# Patient Record
Sex: Female | Born: 1975 | Race: Black or African American | Hispanic: No | Marital: Married | Smoking: Never smoker
Health system: Southern US, Community
[De-identification: ages and names within clinical notes are randomized; demographics above are authoritative.]

## PROBLEM LIST (undated history)

## (undated) DIAGNOSIS — I10 Essential (primary) hypertension: Secondary | ICD-10-CM

## (undated) DIAGNOSIS — O903 Peripartum cardiomyopathy: Secondary | ICD-10-CM

## (undated) DIAGNOSIS — R002 Palpitations: Secondary | ICD-10-CM

## (undated) DIAGNOSIS — N83209 Unspecified ovarian cyst, unspecified side: Secondary | ICD-10-CM

## (undated) DIAGNOSIS — G43909 Migraine, unspecified, not intractable, without status migrainosus: Secondary | ICD-10-CM

## (undated) DIAGNOSIS — M25511 Pain in right shoulder: Secondary | ICD-10-CM

## (undated) DIAGNOSIS — K5900 Constipation, unspecified: Secondary | ICD-10-CM

## (undated) DIAGNOSIS — E119 Type 2 diabetes mellitus without complications: Secondary | ICD-10-CM

## (undated) DIAGNOSIS — R0600 Dyspnea, unspecified: Secondary | ICD-10-CM

## (undated) DIAGNOSIS — F418 Other specified anxiety disorders: Secondary | ICD-10-CM

## (undated) DIAGNOSIS — M222X1 Patellofemoral disorders, right knee: Secondary | ICD-10-CM

## (undated) DIAGNOSIS — Z9989 Dependence on other enabling machines and devices: Secondary | ICD-10-CM

## (undated) DIAGNOSIS — S8263XA Displaced fracture of lateral malleolus of unspecified fibula, initial encounter for closed fracture: Secondary | ICD-10-CM

## (undated) DIAGNOSIS — M222X2 Patellofemoral disorders, left knee: Secondary | ICD-10-CM

## (undated) DIAGNOSIS — F419 Anxiety disorder, unspecified: Secondary | ICD-10-CM

## (undated) DIAGNOSIS — D219 Benign neoplasm of connective and other soft tissue, unspecified: Secondary | ICD-10-CM

## (undated) DIAGNOSIS — J45909 Unspecified asthma, uncomplicated: Secondary | ICD-10-CM

## (undated) DIAGNOSIS — K8020 Calculus of gallbladder without cholecystitis without obstruction: Secondary | ICD-10-CM

## (undated) DIAGNOSIS — I509 Heart failure, unspecified: Secondary | ICD-10-CM

## (undated) DIAGNOSIS — G4733 Obstructive sleep apnea (adult) (pediatric): Secondary | ICD-10-CM

## (undated) DIAGNOSIS — F909 Attention-deficit hyperactivity disorder, unspecified type: Secondary | ICD-10-CM

## (undated) DIAGNOSIS — N3090 Cystitis, unspecified without hematuria: Secondary | ICD-10-CM

## (undated) DIAGNOSIS — R1084 Generalized abdominal pain: Secondary | ICD-10-CM

## (undated) DIAGNOSIS — M7918 Myalgia, other site: Secondary | ICD-10-CM

## (undated) DIAGNOSIS — M549 Dorsalgia, unspecified: Secondary | ICD-10-CM

## (undated) DIAGNOSIS — E538 Deficiency of other specified B group vitamins: Secondary | ICD-10-CM

## (undated) DIAGNOSIS — M999 Biomechanical lesion, unspecified: Secondary | ICD-10-CM

## (undated) DIAGNOSIS — I209 Angina pectoris, unspecified: Secondary | ICD-10-CM

## (undated) DIAGNOSIS — M2550 Pain in unspecified joint: Secondary | ICD-10-CM

## (undated) DIAGNOSIS — Z8679 Personal history of other diseases of the circulatory system: Secondary | ICD-10-CM

## (undated) DIAGNOSIS — M1990 Unspecified osteoarthritis, unspecified site: Secondary | ICD-10-CM

## (undated) DIAGNOSIS — K5790 Diverticulosis of intestine, part unspecified, without perforation or abscess without bleeding: Secondary | ICD-10-CM

## (undated) DIAGNOSIS — Z95810 Presence of automatic (implantable) cardiac defibrillator: Secondary | ICD-10-CM

## (undated) DIAGNOSIS — I428 Other cardiomyopathies: Secondary | ICD-10-CM

## (undated) DIAGNOSIS — E669 Obesity, unspecified: Secondary | ICD-10-CM

## (undated) DIAGNOSIS — IMO0002 Reserved for concepts with insufficient information to code with codable children: Secondary | ICD-10-CM

## (undated) DIAGNOSIS — R569 Unspecified convulsions: Secondary | ICD-10-CM

## (undated) DIAGNOSIS — F32A Depression, unspecified: Secondary | ICD-10-CM

## (undated) DIAGNOSIS — G4700 Insomnia, unspecified: Secondary | ICD-10-CM

## (undated) DIAGNOSIS — R600 Localized edema: Secondary | ICD-10-CM

## (undated) DIAGNOSIS — E559 Vitamin D deficiency, unspecified: Secondary | ICD-10-CM

## (undated) DIAGNOSIS — I499 Cardiac arrhythmia, unspecified: Secondary | ICD-10-CM

## (undated) DIAGNOSIS — S43439A Superior glenoid labrum lesion of unspecified shoulder, initial encounter: Secondary | ICD-10-CM

## (undated) DIAGNOSIS — K589 Irritable bowel syndrome without diarrhea: Secondary | ICD-10-CM

## (undated) DIAGNOSIS — F329 Major depressive disorder, single episode, unspecified: Secondary | ICD-10-CM

## (undated) DIAGNOSIS — R0602 Shortness of breath: Secondary | ICD-10-CM

## (undated) DIAGNOSIS — R079 Chest pain, unspecified: Secondary | ICD-10-CM

## (undated) DIAGNOSIS — I5023 Acute on chronic systolic (congestive) heart failure: Secondary | ICD-10-CM

## (undated) HISTORY — DX: Depression, unspecified: F32.A

## (undated) HISTORY — DX: Displaced fracture of lateral malleolus of unspecified fibula, initial encounter for closed fracture: S82.63XA

## (undated) HISTORY — DX: Superior glenoid labrum lesion of unspecified shoulder, initial encounter: S43.439A

## (undated) HISTORY — DX: Attention-deficit hyperactivity disorder, unspecified type: F90.9

## (undated) HISTORY — DX: Heart failure, unspecified: I50.9

## (undated) HISTORY — PX: LAPAROSCOPIC GASTRIC SLEEVE RESECTION: SHX5895

## (undated) HISTORY — DX: Vitamin D deficiency, unspecified: E55.9

## (undated) HISTORY — DX: Localized edema: R60.0

## (undated) HISTORY — DX: Essential (primary) hypertension: I10

## (undated) HISTORY — DX: Anxiety disorder, unspecified: F41.9

## (undated) HISTORY — DX: Pain in unspecified joint: M25.50

## (undated) HISTORY — DX: Pain in right shoulder: M25.511

## (undated) HISTORY — DX: Major depressive disorder, single episode, unspecified: F32.9

## (undated) HISTORY — DX: Personal history of other diseases of the circulatory system: Z86.79

## (undated) HISTORY — DX: Palpitations: R00.2

## (undated) HISTORY — DX: Shortness of breath: R06.02

## (undated) HISTORY — DX: Diverticulosis of intestine, part unspecified, without perforation or abscess without bleeding: K57.90

## (undated) HISTORY — DX: Obesity, unspecified: E66.9

## (undated) HISTORY — DX: Dorsalgia, unspecified: M54.9

## (undated) HISTORY — PX: FRACTURE SURGERY: SHX138

## (undated) HISTORY — DX: Calculus of gallbladder without cholecystitis without obstruction: K80.20

## (undated) HISTORY — DX: Obstructive sleep apnea (adult) (pediatric): G47.33

## (undated) HISTORY — DX: Ventricular premature depolarization: I49.3

## (undated) HISTORY — DX: Patellofemoral disorders, left knee: M22.2X2

## (undated) HISTORY — DX: Insomnia, unspecified: G47.00

## (undated) HISTORY — DX: Biomechanical lesion, unspecified: M99.9

## (undated) HISTORY — DX: Presence of automatic (implantable) cardiac defibrillator: Z95.810

## (undated) HISTORY — DX: Peripartum cardiomyopathy: O90.3

## (undated) HISTORY — DX: Other cardiomyopathies: I42.8

## (undated) HISTORY — DX: Generalized abdominal pain: R10.84

## (undated) HISTORY — DX: Acute on chronic systolic (congestive) heart failure: I50.23

## (undated) HISTORY — DX: Cystitis, unspecified without hematuria: N30.90

## (undated) HISTORY — DX: Chronic combined systolic (congestive) and diastolic (congestive) heart failure: I50.42

## (undated) HISTORY — PX: CARDIAC CATHETERIZATION: SHX172

## (undated) HISTORY — DX: Deficiency of other specified B group vitamins: E53.8

## (undated) HISTORY — DX: Patellofemoral disorders, right knee: M22.2X1

## (undated) HISTORY — DX: Chest pain, unspecified: R07.9

## (undated) HISTORY — DX: Other specified anxiety disorders: F41.8

## (undated) HISTORY — DX: Constipation, unspecified: K59.00

## (undated) HISTORY — PX: FOOT FRACTURE SURGERY: SHX645

## (undated) HISTORY — DX: Myalgia, other site: M79.18

---

## 1997-05-25 HISTORY — PX: TUBAL LIGATION: SHX77

## 1997-05-25 HISTORY — PX: OVARIAN CYST REMOVAL: SHX89

## 1997-07-27 ENCOUNTER — Inpatient Hospital Stay (HOSPITAL_COMMUNITY): Admission: RE | Admit: 1997-07-27 | Discharge: 1997-08-01 | Payer: Self-pay | Admitting: Obstetrics and Gynecology

## 1997-10-02 ENCOUNTER — Other Ambulatory Visit: Admission: RE | Admit: 1997-10-02 | Discharge: 1997-10-02 | Payer: Self-pay | Admitting: Obstetrics and Gynecology

## 1997-11-05 ENCOUNTER — Other Ambulatory Visit: Admission: RE | Admit: 1997-11-05 | Discharge: 1997-11-05 | Payer: Self-pay | Admitting: Obstetrics and Gynecology

## 1997-11-06 ENCOUNTER — Inpatient Hospital Stay (HOSPITAL_COMMUNITY): Admission: AD | Admit: 1997-11-06 | Discharge: 1997-11-06 | Payer: Self-pay | Admitting: Obstetrics and Gynecology

## 1997-11-15 ENCOUNTER — Other Ambulatory Visit: Admission: RE | Admit: 1997-11-15 | Discharge: 1997-11-15 | Payer: Self-pay | Admitting: Obstetrics and Gynecology

## 1997-12-04 ENCOUNTER — Inpatient Hospital Stay (HOSPITAL_COMMUNITY): Admission: AD | Admit: 1997-12-04 | Discharge: 1997-12-04 | Payer: Self-pay | Admitting: Obstetrics and Gynecology

## 1997-12-06 ENCOUNTER — Other Ambulatory Visit: Admission: RE | Admit: 1997-12-06 | Discharge: 1997-12-06 | Payer: Self-pay | Admitting: *Deleted

## 1997-12-14 ENCOUNTER — Inpatient Hospital Stay (HOSPITAL_COMMUNITY): Admission: AD | Admit: 1997-12-14 | Discharge: 1997-12-17 | Payer: Self-pay | Admitting: Obstetrics and Gynecology

## 1997-12-18 ENCOUNTER — Encounter (HOSPITAL_COMMUNITY): Admission: RE | Admit: 1997-12-18 | Discharge: 1998-03-18 | Payer: Self-pay | Admitting: *Deleted

## 1998-01-28 ENCOUNTER — Inpatient Hospital Stay (HOSPITAL_COMMUNITY): Admission: AD | Admit: 1998-01-28 | Discharge: 1998-01-28 | Payer: Self-pay | Admitting: Obstetrics and Gynecology

## 1998-04-01 ENCOUNTER — Ambulatory Visit (HOSPITAL_COMMUNITY): Admission: RE | Admit: 1998-04-01 | Discharge: 1998-04-01 | Payer: Self-pay | Admitting: Interventional Cardiology

## 1998-04-01 ENCOUNTER — Encounter: Payer: Self-pay | Admitting: Interventional Cardiology

## 1998-11-17 ENCOUNTER — Emergency Department (HOSPITAL_COMMUNITY): Admission: EM | Admit: 1998-11-17 | Discharge: 1998-11-17 | Payer: Self-pay | Admitting: Emergency Medicine

## 1998-11-17 ENCOUNTER — Encounter: Payer: Self-pay | Admitting: Emergency Medicine

## 1999-03-22 ENCOUNTER — Ambulatory Visit (HOSPITAL_COMMUNITY): Admission: RE | Admit: 1999-03-22 | Discharge: 1999-03-22 | Payer: Self-pay | Admitting: Obstetrics and Gynecology

## 1999-03-28 ENCOUNTER — Ambulatory Visit (HOSPITAL_COMMUNITY): Admission: RE | Admit: 1999-03-28 | Discharge: 1999-03-28 | Payer: Self-pay | Admitting: Obstetrics and Gynecology

## 1999-05-25 ENCOUNTER — Inpatient Hospital Stay (HOSPITAL_COMMUNITY): Admission: AD | Admit: 1999-05-25 | Discharge: 1999-05-25 | Payer: Self-pay | Admitting: Obstetrics and Gynecology

## 2000-11-13 ENCOUNTER — Emergency Department (HOSPITAL_COMMUNITY): Admission: EM | Admit: 2000-11-13 | Discharge: 2000-11-13 | Payer: Self-pay | Admitting: Emergency Medicine

## 2000-11-13 ENCOUNTER — Encounter: Payer: Self-pay | Admitting: Emergency Medicine

## 2000-12-08 ENCOUNTER — Encounter: Admission: RE | Admit: 2000-12-08 | Discharge: 2000-12-22 | Payer: Self-pay | Admitting: *Deleted

## 2000-12-13 ENCOUNTER — Other Ambulatory Visit: Admission: RE | Admit: 2000-12-13 | Discharge: 2000-12-13 | Payer: Self-pay | Admitting: Obstetrics and Gynecology

## 2001-05-13 ENCOUNTER — Inpatient Hospital Stay (HOSPITAL_COMMUNITY): Admission: AD | Admit: 2001-05-13 | Discharge: 2001-05-13 | Payer: Self-pay | Admitting: Obstetrics and Gynecology

## 2001-12-27 ENCOUNTER — Encounter: Admission: RE | Admit: 2001-12-27 | Discharge: 2002-03-27 | Payer: Self-pay | Admitting: *Deleted

## 2002-05-10 ENCOUNTER — Emergency Department (HOSPITAL_COMMUNITY): Admission: EM | Admit: 2002-05-10 | Discharge: 2002-05-10 | Payer: Self-pay | Admitting: Emergency Medicine

## 2003-07-09 ENCOUNTER — Other Ambulatory Visit: Admission: RE | Admit: 2003-07-09 | Discharge: 2003-07-09 | Payer: Self-pay | Admitting: Obstetrics and Gynecology

## 2003-07-12 ENCOUNTER — Encounter: Admission: RE | Admit: 2003-07-12 | Discharge: 2003-07-12 | Payer: Self-pay | Admitting: Obstetrics and Gynecology

## 2004-02-28 ENCOUNTER — Inpatient Hospital Stay (HOSPITAL_COMMUNITY): Admission: AD | Admit: 2004-02-28 | Discharge: 2004-02-28 | Payer: Self-pay | Admitting: Obstetrics and Gynecology

## 2004-09-24 ENCOUNTER — Inpatient Hospital Stay (HOSPITAL_COMMUNITY): Admission: AD | Admit: 2004-09-24 | Discharge: 2004-09-24 | Payer: Self-pay | Admitting: Obstetrics and Gynecology

## 2004-10-23 ENCOUNTER — Other Ambulatory Visit: Admission: RE | Admit: 2004-10-23 | Discharge: 2004-10-23 | Payer: Self-pay | Admitting: Family Medicine

## 2006-05-25 HISTORY — PX: LAPAROSCOPY ABDOMEN DIAGNOSTIC: PRO50

## 2006-12-24 HISTORY — PX: LAPAROSCOPIC CHOLECYSTECTOMY: SUR755

## 2007-01-04 ENCOUNTER — Inpatient Hospital Stay (HOSPITAL_COMMUNITY): Admission: AD | Admit: 2007-01-04 | Discharge: 2007-01-04 | Payer: Self-pay | Admitting: Obstetrics & Gynecology

## 2007-01-05 ENCOUNTER — Encounter: Payer: Self-pay | Admitting: Emergency Medicine

## 2007-01-06 ENCOUNTER — Observation Stay (HOSPITAL_COMMUNITY): Admission: EM | Admit: 2007-01-06 | Discharge: 2007-01-08 | Payer: Self-pay | Admitting: *Deleted

## 2007-01-07 ENCOUNTER — Ambulatory Visit: Admission: RE | Admit: 2007-01-07 | Discharge: 2007-01-07 | Payer: Self-pay | Admitting: General Surgery

## 2007-01-07 ENCOUNTER — Encounter (HOSPITAL_BASED_OUTPATIENT_CLINIC_OR_DEPARTMENT_OTHER): Payer: Self-pay | Admitting: General Surgery

## 2007-01-11 ENCOUNTER — Inpatient Hospital Stay (HOSPITAL_COMMUNITY): Admission: EM | Admit: 2007-01-11 | Discharge: 2007-01-13 | Payer: Self-pay | Admitting: Emergency Medicine

## 2007-01-26 ENCOUNTER — Ambulatory Visit: Payer: Self-pay | Admitting: Internal Medicine

## 2007-04-15 ENCOUNTER — Emergency Department (HOSPITAL_COMMUNITY): Admission: EM | Admit: 2007-04-15 | Discharge: 2007-04-16 | Payer: Self-pay | Admitting: Emergency Medicine

## 2007-11-18 ENCOUNTER — Encounter: Admission: RE | Admit: 2007-11-18 | Discharge: 2007-11-18 | Payer: Self-pay | Admitting: Interventional Cardiology

## 2010-08-01 ENCOUNTER — Other Ambulatory Visit: Payer: Self-pay | Admitting: Family Medicine

## 2010-08-01 ENCOUNTER — Other Ambulatory Visit (HOSPITAL_COMMUNITY)
Admission: RE | Admit: 2010-08-01 | Discharge: 2010-08-01 | Disposition: A | Discharge status: Home / Self Care | Payer: Commercial Managed Care - PPO | Source: Ambulatory Visit | Attending: Family Medicine | Admitting: Family Medicine

## 2010-08-01 DIAGNOSIS — Z124 Encounter for screening for malignant neoplasm of cervix: Secondary | ICD-10-CM | POA: Insufficient documentation

## 2010-08-01 DIAGNOSIS — Z1159 Encounter for screening for other viral diseases: Secondary | ICD-10-CM | POA: Insufficient documentation

## 2010-10-07 NOTE — Consult Note (Signed)
[name], [name]                ACCOUNT NO.:  [id number]   MEDICAL RECORD NO.:  [id number]          PATIENT TYPE:  INP   LOCATION:  5706                         FACILITY:  [facility]   PHYSICIAN:  Clovis Pu. Cornett, M.D.DATE OF BIRTH:  05-29-1975   DATE OF CONSULTATION:  01/06/2007  DATE OF DISCHARGE:                                 CONSULTATION   CHIEF COMPLAINT:  Abdominal pain.   REFERRING PHYSICIAN:  Anna Genre. Little, M.D.   REASON FOR CONSULTATION:  Abdominal pain.   HISTORY OF PRESENT ILLNESS:  The patient is a 35 year old female with a  3-day history of right upper quadrant pain, nausea, and vomiting.  She  was initially seen at [facility] on the 11th and then her pain  continued.  She was seen yesterday at [facility].  She was  sent home after ultrasound revealed gallstones and thickened gallbladder  wall.  Her pain persisted; and she was told to come to see [place] today  after discussing this with Dr. Catha Gosselin.   Her chief complaint is upper quadrant pain with severe pain 6/10.  Nausea and vomiting is constant.  Pain medicine is not helping her pain  at this point in time.  There is no radiation to her back.  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 Verdis Prime, today, her  cardiologist; who stated she has been doing well over the last 2 years  on medical management.  We were asked to see her at the request of the  emergency room by Dr. Ignacia Palma and Dr. Clarene Duke, her primary care doctor.   PAST MEDICAL HISTORY:  Cardiomyopathy pregnancy.   PAST SURGICAL HISTORY:  1. Dermoid ovarian cyst removed 1999.  2. C-section in 1999.  3. Foot surgery in 2001.   ALLERGIES:  MORPHINE, SULFA, and ASPIRIN.   MEDICATIONS:  1. Toprol XL 550 mg daily.  2. Diovan 80/25 daily.  3. Vicodin 5/500 q.4 h. as needed for pain.   SOCIAL HISTORY:  Denies tobacco or alcohol use.   FAMILY HISTORY:   Noncontributory.   REVIEW OF SYSTEMS:  A 15-point review of systems I have reviewed, in the  chart today; these are negative except for that stated above.   PHYSICAL EXAMINATION:  VITAL SIGNS:  Temperature 97, pulse 80, blood  pressure 130/84.  GENERAL APPEARANCE:  Pleasant female in mild distress.  HEENT:  Extraocular movements are not intact.  No scleral icterus.  NECK:  Supple, nontender, full range of motion, no mass.  PULMONARY:  Lungs clear to auscultation.  Chest wall motion normal.  CARDIOVASCULAR:  Regular rate rhythm without murmur, rub, or gallop.  There is no evidence of extremity edema.  ABDOMEN:  Soft with positive  Murphy's sign, palpable gallbladder under right costal margin.  No  hernia.  EXTREMITIES:  No clubbing, cyanosis, nor edema.  Muscle tone normal.  NEUROLOGIC EXAMINATION:  Glasgow coma scale 15.  Motor sensory function  intact.   DIAGNOSTIC STUDIES:  Ultrasound reveals a thickened gallbladder wall  with gallstones.  No common duct dilatation.  White count is 5800.  LFTs, amylase, and lipase are normal.   IMPRESSION:  Acute cholecystitis.   PLAN:  She will be admitted to [facility].  I spoke with Dr.  Lurene Shadow who will care for her at this point.  She will be set up for  laparoscopic cholecystectomy in the morning and IV antibiotics and IV  fluids.      Thomas A. Cornett, M.D.  Electronically Signed     TAC/MEDQ  D:  01/06/2007  T:  01/07/2007  Job:  [id number]

## 2010-10-07 NOTE — Consult Note (Signed)
[name], [name] NO.:  [id number]   MEDICAL RECORD NO.:  [id number]          PATIENT TYPE:  INP   LOCATION:  5738                         FACILITY:  [facility]   PHYSICIAN:  Jordan Hawks. Elnoria Howard, MD    DATE OF BIRTH:  05-May-1976   DATE OF CONSULTATION:  DATE OF DISCHARGE:                                 CONSULTATION   At that time, the surgery was performed without any event, and she was  subsequently discharged home.  However, the patient states that she  never felt well, and over the weekend, her symptoms continued to  increase.  Currently, she complains of having diffuse abdominal pain, no  fever.  Upon admission, at the evaluation in the emergency room, the  patient was noted to have an elevated white blood cell count of 15.3 and  normal liver enzymes and lipase.  CT scan was performed and did reveal  some fluid around the liver, and a HIDA scan was performed.  The HIDA  scan did state having a bile duct leak.  Subsequently, GI consultation  was requested for further evaluation and treatment.   PAST MEDICAL AND PAST SURGICAL HISTORY:  Is as stated above.   FAMILY HISTORY:  Noncontributory.   SOCIAL HISTORY:  Negative for alcohol, tobacco or illicit drug use.   REVIEW OF SYSTEMS:  As per the history of present illness, otherwise  negative.   PHYSICAL EXAMINATION:  VITAL SIGNS:  Blood pressure is 128/87, heart  rate 94, respirations 20, temperature is 98.1, pulse ox is 95% on room  air.  GENERAL:  The patient is in acute distress, alert and oriented.  HEENT:  Normocephalic, atraumatic.  Extraocular muscles intact.  NECK:  Supple.  No lymphadenopathy.  LUNGS:  Clear to auscultation bilaterally.  CARDIOVASCULAR:  Regular rhythm.  ABDOMEN:  Soft but diffusely tender.  No rebound or rigidity.  EXTREMITIES:  No clubbing, cyanosis or edema.   LABORATORY VALUES:  White blood cell count is 15.3, hemoglobin 12.5,  platelets at 280, sodium 137, potassium 4.0, chloride  101, CO2 31, BUN  2, creatinine 0.7, glucose 132, AST is 42, ALT 34, alk phos 99, total  bilirubin 0.9, lipase 14.  Amylase is 3.8.   IMPRESSION:  Bile duct leak.  At this time, an ERCP is required in order  to help address the bile duct leak.  It does not appear that it is  leaking from the cystic duct, and the report does state that there is  positive leakage around the falciform ligament.  This could be some  leakage from the accessory duct of Luschka.  Further evaluation will be  performed with an ERCP.      Jordan Hawks Elnoria Howard, MD  Electronically Signed     PDH/MEDQ  D:  01/11/2007  T:  01/12/2007  Job:  [id number]   cc:   Sandria Bales. Ezzard Standing, M.D.  Leonie Man, M.D.

## 2010-10-07 NOTE — H&P (Signed)
[name], [name] NO.:  [id number]   MEDICAL RECORD NO.:  [id number]          PATIENT TYPE:  EMS   LOCATION:  MAJO                         FACILITY:  [facility]   PHYSICIAN:  Sandria Bales. Ezzard Standing, M.D.  DATE OF BIRTH:  08/03/1975   DATE OF ADMISSION:  01/11/2007  DATE OF DISCHARGE:                              HISTORY & PHYSICAL   HISTORY OF PRESENT ILLNESS:  This is a 35 year old black female who is a  patient of Dr. Sigmund Hazel and who also sees Dr. Verdis Prime from a  cardiology standpoint.  She presented on January 06, 2007 to [facility] with what was felt to be gallbladder disease; and was admitted  by Dr. Marca Ancona.  She was then sent to the operating room on January 07, 2007 by Dr. Leonie Man; and underwent a laparoscopic  cholecystectomy which was uneventful.  She was discharged home on  Saturday, August 16; however, never felt well.  When she got home she  had increasing abdominal pain on the 17th of August.  She says that she  tried to call our office, but never got a return call.  On Monday, the  18th, she slept most of the day; but then by that evening was having  abdominal pain which some worse nausea and vomiting; and came to the  emergency room around midnight on January 10, 2007.  I was called for  further evaluation.  She had labs, which showed a mildly elevated white  blood count to 15,300 with normal liver function and normal lipase.  A  CT scan showed minimal fluid in her gallbladder fossa, a very scant hint  of free air in that area, but no other significant problems.  She is  pending a HIDA scan at the time of this dictation.   PAST MEDICAL HISTORY/ALLERGIES:  She has allergies to ASPIRIN which he  says makes for difficult breathing, and then CIPRO causes itching,  MORPHINE causes itching, SULFA causes itching and a rash.   CURRENT MEDICATIONS:  1. Toprol-XL 50 mg daily.  2. Diovan 80 mg daily.  3. She was also on some Vicodin for  pain.   PRIOR OPERATIONS:  She had an ovarian cyst/desmoid tumor removed in 1999  by Dr. Kirkland Hun.  She had a cesarean section in 1999; and had  right foot surgery in 2001.   REVIEW OF SYSTEMS:  NEUROLOGIC:  She has had no evidence of seizure, or  loss of consciousness.  PULMONARY:  She has had no history of pneumonia or tuberculosis.  She  does not smoke cigarettes.  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 Verdis Prime.  He sees her once a year; though she says that she has  been doing very well.  She has never had a cardiac catheterization; and  her current medications are taken for both her cardiomyopathy and  hypertension of about nine years duration.  GASTROINTESTINAL:  She has no evidence of peptic ulcer disease, liver  disease, pancreatic disease or colon disease.  Again, her  prior  operations beside the laparoscopic cholecystectomy was her ovarian cyst  removal and C-section.   SOCIAL HISTORY:  She is currently not employed.  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  Her husband is in the room with her.   PHYSICAL EXAMINATION:  On physical exam, her pulse is 108.  Temperature  98.1, respirations 19, blood pressure 144/86.  GENERAL:  She is a well-nourished, somewhat obese, black female, alert  and cooperative on physical exam.  HEENT:  Unremarkable.  NECK:  Supple without masses or thyromegaly.  LUNGS:  Clear to auscultation.  HEART:  Regular rate and rhythm without murmur or rub.  ABDOMEN:  Shows decreased bowel sounds.  She has some mild tenderness  more in the epigastrium.  Her incision look well healed.  She has no  real guarding, though.  EXTREMITIES:  She had good movement in all 4 extremities.  NEUROLOGIC:  Grossly intact.   REVIEW OF HER LABS:  Show a white blood count of 15,300, a hemoglobin of  12.5, hematocrit of 37.3, her  neutrophil count is 81%.  Her urinalysis  was negative.  Sodium was 137, potassium 4.0, chloride of 101, CO2 of  31, glucose of 132, BUN of 2 her SGOT is 42.  Her lipase is 14.   CT scan showed some minimal perihepatic fluid, may be a dot of small  free air; but otherwise is noncontributory.   IMPRESSION:  1. Post cholecystectomy abdominal pain.  We will plan a HIDA scan for      complete evaluation to rule out bile leak.  For the patient's      discomfort, we will plan admission, IV hydration, repeat labs in      the morning, and discuss this with the patient and her husband.  2. Hypertension.  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      Sandria Bales. Ezzard Standing, M.D.  Electronically Signed     DHN/MEDQ  D:  01/11/2007  T:  01/11/2007  Job:  [id number]   cc:   Sigmund Hazel, M.D.  Lyn Records, M.D.

## 2010-10-07 NOTE — Discharge Summary (Signed)
[name], [name]                ACCOUNT NO.:  [id number]   MEDICAL RECORD NO.:  [id number]          PATIENT TYPE:  INP   LOCATION:  5738                         FACILITY:  [facility]   PHYSICIAN:  Currie Paris, M.D.DATE OF BIRTH:  05/02/1976   DATE OF ADMISSION:  01/11/2007  DATE OF DISCHARGE:  01/13/2007                               DISCHARGE SUMMARY   PRIMARY SURGEON:  Leonie Man, M.D.   CONSULTANTS:  Dr. Jordan Hawks. Elnoria Howard, gastroenterology.   CHIEF COMPLAINT AND REASON FOR ADMISSION:  The patient is a 35 year old  female patient, who has a past medical history of hypertension, as well  as postpartum cardiomyopathy, which is followed by Dr. Verdis Prime.  The  patient's primary care physician is Dr. Sigmund Hazel.  She was initially  admitted to [facility] on 01/06/2007, with acute cholecystitis.  S he subsequently underwent laparoscopic cholecystectomy by Dr. Lurene Shadow  on the 15th.  The procedure itself was uneventful.  The patient was  discharged on Saturday, the 16th.  She reports never really feeling well  postoperatively.  She began having increasing abdominal pain on the 17th  and on the 18th, Monday, she slept most of the day, but by that evening  was having significant abdominal pain, with increasing nausea and  vomiting.  She presented to the emergency room just prior to midnight on  the 18th.  Laboratory work showed an elevated white count of 15,300,  with normal liver function tests and normal lipase.  A computed  tomography scan demonstrated minimal fluid in her gallbladder fossa,  small free air suspected related to recent operative intervention, and a  HIDA scan was pending to evaluate for a bile leak.  She was evaluated by  Dr. Ezzard Standing, who noted diminished bowel sounds on abdominal examination,  with mild tenderness in the epigastrium.  The incisions looked well-  healed.  She was admitted with the diagnosis of post cholecystectomy  abdominal pain, rule  out bile leak, with associated problems related to  hypertension and postpartum cardiomyopathy, which have been stable.   HOSPITAL COURSE:  The patient was admitted on the 19th.  As noted,  subsequent HIDA scan did reveal a bile leak.  Dr. Elnoria Howard with  gastroenterology was consulted, and the patient underwent endoscopic  retrograde cholangiopancreatography on 01/11/2007, with an attempt to  place a biliary stent.  This was unsuccessful.  On the 20th, she was re-  taken to the endoscopy laboratory, when another attempt at placing a  stent via endoscopic retrograde cholangiopancreatography was done, and,  again, unsuccessful with the stent placement.  After discussion with Dr.  Elnoria Howard, Dr. Jamey Ripa determined that there was no further interventions  available for gastroenterology services here.  Dr. Leone Payor was the  second physician to attempt endoscopic retrograde  cholangiopancreatography on the 20th.  Again, it was determined that the  patient would benefit from transfer to a tertiary facility for further  evaluation and treatment of a bile leak.  The patient wished to avoid  intraoperative correction, if possible.  Dr. Elnoria Howard did contact a  physician in the gastroenterology  department at [facility], and they have agreed to accept the patient in  transfer regarding this persistent bile leak.  At this time, I do not  have this physician's name for my dictation.   Today's laboratory work shows an increase in the total bilirubin to 2.9  from 1.2 yesterday.  Alkaline phosphatase is 127.  It was 127 yesterday.  AST and ALT are normal.  Yesterday they were mildly elevated at 44 and  43 each.  Amylase and lipase are normal today.  The patient's white  count is also increased slightly to 17,100, from 16,000.  Hemoglobin is  14, and hematocrit is 41.8.  Neutrophils are 83%.  The patient has also  been having difficulty with persistent tachycardia, probably related  to  a combination of volume depletion and beta blocker withdrawal.  Attempts  were made to get the patient on her oral blood pressure medications,  including Toprol XL, but we feel these have not been absorbed  adequately, therefore we switched her to intravenous Lopressor today.  These have been running at 200 an hour.  In addition, the patient has  had intractable hiccoughs today, and she has been given one dose of  Thorazine 25 mg intravenously.  At the time of dictation, the patient is  having a PICC line placed for lack of intravenous access.  In addition,  she has been treated with Zosyn here empirically to cover enteric  pathogens.   FINAL DISCHARGE DIAGNOSES:  1. Postoperative bile leak after laparoscopic cholecystectomy on      01/07/2007.  2. Endoscopic retrograde cholangiopancreatography x2, unsuccessful, in      attempts to stent the biliary tract.  3. Tachycardia, multifactorial, secondary to volume depletion and beta      blocker withdrawal.  4. Hypertension, currently moderately controlled.  5. Postpartum cardiomyopathy, stable, without signs of congestive      heart failure this admission.  6. Intractable hiccoughs, status post one dose of Thorazine.   DISCHARGE MEDICATIONS:  At the time of discharge, the patient was on the  following medications here at [facility]:  1. Dilaudid PCA, full dose.  2. Lopressor 5 mg intravenously q.4h., with hold parameters.  3. Phenergan 25 mg p.r.n. intravenously q.6h.  4. Zosyn 3.375 mg intravenously q.6h.  5. Zofran 4 mg IV q.4h. p.r.n. nausea and vomiting.   DISPOSITION:  I anticipate the patient will be transferred via ambulance  to [facility] to receive care  regarding persistent bile leak.      Allison L. Rennis Harding, N.P.      Currie Paris, M.D.  Electronically Signed    ALE/MEDQ  D:  01/13/2007  T:  01/13/2007  Job:  [id number]   cc:   Leonie Man, M.D.  Jordan Hawks  Elnoria Howard, MD  Lyn Records, M.D.  Sigmund Hazel, M.D.

## 2010-10-07 NOTE — Op Note (Signed)
[name], [name]                ACCOUNT NO.:  [id number]   MEDICAL RECORD NO.:  [id number]          PATIENT TYPE:  INP   LOCATION:  NA                           FACILITY:  [facility]   PHYSICIAN:  Leonie Man, M.D.   DATE OF BIRTH:  May 04, 1976   DATE OF PROCEDURE:  01/07/2007  DATE OF DISCHARGE:                               OPERATIVE REPORT   PREOPERATIVE DIAGNOSIS:  Calculus cholecystitis.   POSTOPERATIVE DIAGNOSIS:  Calculus cholecystitis.   OPERATION PERFORMED:  Laparoscopic cholecystectomy with intraoperative  cholangiogram.   SURGEON:  Leonie Man, M.D.   ASSISTANT:  Adolph Pollack, M.D.   ANESTHESIA:  The anesthesia is general.   SPECIMENS:  The specimens to lab included gallbladder with stones.   ESTIMATED BLOOD LOSS:  The estimated blood loss was minimal.   COMPLICATIONS:  None apparent.   DISPOSITION:  The patient was taken to the PACU in excellent condition.   NOTE AND INDICATIONS:  The patient is a 35 year old married black female  whose habitus is obese.  She has been having symptomatic right upper  quadrant pain associated with nausea,  but without vomiting for the past  10 days.  An ultrasound of the abdomen showed cholelithiasis with some  gallbladder wall thickening, but no pericholecystic fluid or other  stigmata of acute cholecystitis.  The patient has no leukocytosis or  fever.  Her liver function studies are within normal limits.  Gallbladder ultrasound showed multiple layering gallstones.  The patient  comes to the operating room now after the risks and potential benefits  of surgery had been fully discussed.  All questions were answered.  Consent for surgery was obtained.   DESCRIPTION OF THE OPERATION:  With the patient positioned supinely and  following the induction of satisfactory general anesthesia the abdomen  was prepped and draped to be included in a sterile operative field.  Positive identification of the patient as [name]  and the operation  for laparoscopic cholecystectomy was made.   Open laparoscopy was created at the umbilicus through a transverse  supraumbilical incision with the insertion of a Hassan cannula by open  technique.  The peritoneal cavity is insufflated to 14 mmHg pressure  using carbon dioxide.  The camera was inserted and a visual exploration  of the abdomen was carried out.  The patient had some extensive lower  abdominal surgery and in that she has undergone a cesarean section and  an ovarian cystectomy in the past.  Pelvic organs were not visualized.  The large and small intestine were viewed appeared to be abnormal.  The  liver edges were sharp.  The liver surface was smooth.  The gallbladder  was only mildly chronically scarred.   Under direct vision epigastric and flank ports were placed.  The  gallbladder was grasped and retracted cephalad, and dissection was  carried down in the region of the hepatic duodenal ligament to isolate  the cystic artery and cystic duct.  The cystic duct was traced up to its  entry into the gallbladder wall and was then clipped proximally.  It was  opened and  a cystic duct cholangiogram was done by passing a Cook  catheter transcutaneously into the cystic duct and injecting one-half  strength of Hypaque into the extrahepatic biliary system.  The resulting  cholangiogram showed prompt flow of contrast into the duodenum through  normal caliber ducts.  There were no filling defects noted.   The cholangiocatheter was removed; and, the cystic duct was triply  clipped and then transected.  The cystic artery was also triply clipped  and transected.  The gallbladder was dissected free from the liver bed  using electrocautery and maintaining hemostasis throughout the entire  course of the dissection.  At the end of this dissection the gallbladder  was traced and then placed in an Endopouch and removed from the  peritoneal cavity without difficulty.  Right  upper quadrant irrigation  was carried out and it was noted to be clear.  The gallbladder bed was  inspected and noted to be dry.   Sponge and instrument counts were then verified.   The trocars removed under direct vision.  Pneumoperitoneum was allowed  to deflate.  The wound was closed in layers as follows; the umbilical  wound into layers with 0-Vicryl and 4-0 Monocryl, epigastric and flank  wounds were closed with 4-0 Monocryl sutures.  All wounds were  reinforced with Steri-Strips.  Sterile dressings were applied.  The  anesthetic was reversed.   The patient was removed from the operating room to the recovery room in  stable condition.  She tolerated the procedure well.      Leonie Man, M.D.  Electronically Signed     PB/MEDQ  D:  01/07/2007  T:  01/08/2007  Job:  [id number]

## 2011-03-06 LAB — DIFFERENTIAL
Basophils Absolute: 0
Basophils Absolute: 0
Basophils Absolute: 0.1
Basophils Relative: 0
Basophils Relative: 0
Basophils Relative: 0
Eosinophils Absolute: 0
Eosinophils Absolute: 0
Eosinophils Absolute: 0
Eosinophils Relative: 0
Eosinophils Relative: 0
Eosinophils Relative: 0
Lymphocytes Relative: 14
Lymphocytes Relative: 7 — ABNORMAL LOW
Lymphocytes Relative: 8 — ABNORMAL LOW
Lymphs Abs: 1.1
Lymphs Abs: 1.3
Lymphs Abs: 2.1
Monocytes Absolute: 0.7
Monocytes Absolute: 1.5 — ABNORMAL HIGH
Monocytes Absolute: 1.7 — ABNORMAL HIGH
Monocytes Relative: 10
Monocytes Relative: 5
Monocytes Relative: 9
Neutro Abs: 12.4 — ABNORMAL HIGH
Neutro Abs: 13.3 — ABNORMAL HIGH
Neutro Abs: 14.2 — ABNORMAL HIGH
Neutrophils Relative %: 81 — ABNORMAL HIGH
Neutrophils Relative %: 83 — ABNORMAL HIGH
Neutrophils Relative %: 83 — ABNORMAL HIGH

## 2011-03-06 LAB — CBC
HCT: 36.8
HCT: 37.3
HCT: 39.7
HCT: 41.8
Hemoglobin: 12.5
Hemoglobin: 12.6
Hemoglobin: 13.3
Hemoglobin: 14
MCHC: 33.5
MCHC: 33.6
MCHC: 33.6
MCHC: 34.3
MCV: 85.7
MCV: 86.5
MCV: 87.4
MCV: 88.3
Platelets: 247
Platelets: 266
Platelets: 280
Platelets: 317
RBC: 4.29
RBC: 4.31
RBC: 4.54
RBC: 4.74
RDW: 12.8
RDW: 13
RDW: 13.4
RDW: 13.6
WBC: 15.3 — ABNORMAL HIGH
WBC: 16 — ABNORMAL HIGH
WBC: 17.1 — ABNORMAL HIGH
WBC: 5.6

## 2011-03-06 LAB — URINE MICROSCOPIC-ADD ON

## 2011-03-06 LAB — COMPREHENSIVE METABOLIC PANEL
ALT: 15
ALT: 33
ALT: 34
ALT: 43 — ABNORMAL HIGH
AST: 17
AST: 30
AST: 42 — ABNORMAL HIGH
AST: 44 — ABNORMAL HIGH
Albumin: 3.1 — ABNORMAL LOW
Albumin: 3.5
Albumin: 3.6
Albumin: 3.8
Alkaline Phosphatase: 120 — ABNORMAL HIGH
Alkaline Phosphatase: 127 — ABNORMAL HIGH
Alkaline Phosphatase: 96
Alkaline Phosphatase: 99
BUN: 2 — ABNORMAL LOW
BUN: 2 — ABNORMAL LOW
BUN: 3 — ABNORMAL LOW
BUN: 3 — ABNORMAL LOW
CO2: 30
CO2: 31
CO2: 31
CO2: 31
Calcium: 9
Calcium: 9
Calcium: 9.3
Calcium: 9.4
Chloride: 101
Chloride: 103
Chloride: 95 — ABNORMAL LOW
Chloride: 97
Creatinine, Ser: 0.76
Creatinine, Ser: 0.81
Creatinine, Ser: 0.83
Creatinine, Ser: 0.92
GFR calc Af Amer: >60
GFR calc Af Amer: >60
GFR calc Af Amer: >60
GFR calc Af Amer: >60
GFR calc non Af Amer: >60
GFR calc non Af Amer: >60
GFR calc non Af Amer: >60
GFR calc non Af Amer: >60
Glucose, Bld: 101 — ABNORMAL HIGH
Glucose, Bld: 122 — ABNORMAL HIGH
Glucose, Bld: 130 — ABNORMAL HIGH
Glucose, Bld: 132 — ABNORMAL HIGH
Potassium: 4
Potassium: 4.1
Potassium: 4.2
Potassium: 4.8
Sodium: 135
Sodium: 137
Sodium: 137
Sodium: 139
Total Bilirubin: 0.5
Total Bilirubin: 0.6
Total Bilirubin: 1.2
Total Bilirubin: 2.9 — ABNORMAL HIGH
Total Protein: 6.7
Total Protein: 6.9
Total Protein: 7
Total Protein: 7.2

## 2011-03-06 LAB — URINALYSIS, ROUTINE W REFLEX MICROSCOPIC
Bilirubin Urine: NEGATIVE
Glucose, UA: NEGATIVE
Hgb urine dipstick: NEGATIVE
Ketones, ur: NEGATIVE
Leukocytes, UA: NEGATIVE
Nitrite: NEGATIVE
Protein, ur: 30 — AB
Specific Gravity, Urine: 1.029
Urobilinogen, UA: 1
pH: 8

## 2011-03-06 LAB — LIPASE, BLOOD
Lipase: 14
Lipase: 26
Lipase: 31

## 2011-03-06 LAB — AMYLASE: Amylase: 61

## 2011-03-06 LAB — PREGNANCY, URINE: Preg Test, Ur: NEGATIVE

## 2011-03-09 LAB — URINALYSIS, ROUTINE W REFLEX MICROSCOPIC
Bilirubin Urine: NEGATIVE
Glucose, UA: NEGATIVE
Ketones, ur: NEGATIVE
Leukocytes, UA: NEGATIVE
Nitrite: NEGATIVE
Protein, ur: NEGATIVE
Specific Gravity, Urine: 1.025
Urobilinogen, UA: 0.2
pH: 6

## 2011-03-09 LAB — POCT PREGNANCY, URINE
Operator id: 251141
Preg Test, Ur: NEGATIVE

## 2011-03-09 LAB — URINE MICROSCOPIC-ADD ON: WBC, UA: NONE SEEN

## 2012-05-26 ENCOUNTER — Emergency Department (HOSPITAL_COMMUNITY): Payer: Commercial Managed Care - PPO

## 2012-05-26 ENCOUNTER — Emergency Department (HOSPITAL_COMMUNITY)
Admission: EM | Admit: 2012-05-26 | Discharge: 2012-05-26 | Disposition: A | Discharge status: Home / Self Care | Payer: Commercial Managed Care - PPO | Attending: Emergency Medicine | Admitting: Emergency Medicine

## 2012-05-26 ENCOUNTER — Encounter (HOSPITAL_COMMUNITY): Payer: Self-pay | Admitting: Emergency Medicine

## 2012-05-26 DIAGNOSIS — Z79899 Other long term (current) drug therapy: Secondary | ICD-10-CM | POA: Insufficient documentation

## 2012-05-26 DIAGNOSIS — Z8679 Personal history of other diseases of the circulatory system: Secondary | ICD-10-CM | POA: Insufficient documentation

## 2012-05-26 DIAGNOSIS — R51 Headache: Secondary | ICD-10-CM | POA: Insufficient documentation

## 2012-05-26 DIAGNOSIS — R0602 Shortness of breath: Secondary | ICD-10-CM | POA: Insufficient documentation

## 2012-05-26 DIAGNOSIS — R079 Chest pain, unspecified: Secondary | ICD-10-CM | POA: Insufficient documentation

## 2012-05-26 DIAGNOSIS — I10 Essential (primary) hypertension: Secondary | ICD-10-CM | POA: Insufficient documentation

## 2012-05-26 DIAGNOSIS — Z8742 Personal history of other diseases of the female genital tract: Secondary | ICD-10-CM | POA: Insufficient documentation

## 2012-05-26 DIAGNOSIS — R110 Nausea: Secondary | ICD-10-CM | POA: Insufficient documentation

## 2012-05-26 HISTORY — DX: Essential (primary) hypertension: I10

## 2012-05-26 HISTORY — DX: Unspecified ovarian cyst, unspecified side: N83.209

## 2012-05-26 LAB — BASIC METABOLIC PANEL
BUN: 8 mg/dL (ref 6–23)
CO2: 26 mEq/L (ref 19–32)
Calcium: 10.1 mg/dL (ref 8.4–10.5)
Chloride: 95 mEq/L — ABNORMAL LOW (ref 96–112)
Creatinine, Ser: 0.67 mg/dL (ref 0.50–1.10)
GFR calc Af Amer: >90 mL/min (ref >90)
GFR calc non Af Amer: >90 mL/min (ref >90)
Glucose, Bld: 95 mg/dL (ref 70–99)
Potassium: 4.8 mEq/L (ref 3.5–5.1)
Sodium: 135 mEq/L (ref 135–145)

## 2012-05-26 LAB — CBC
HCT: 43.2 % (ref 36.0–46.0)
Hemoglobin: 14.3 g/dL (ref 12.0–15.0)
MCH: 28.5 pg (ref 26.0–34.0)
MCHC: 33.1 g/dL (ref 30.0–36.0)
MCV: 86.2 fL (ref 78.0–100.0)
Platelets: 303 10*3/uL (ref 150–400)
RBC: 5.01 MIL/uL (ref 3.87–5.11)
RDW: 13.7 % (ref 11.5–15.5)
WBC: 7.4 10*3/uL (ref 4.0–10.5)

## 2012-05-26 LAB — POCT I-STAT TROPONIN I: Troponin i, poc: 0 ng/mL (ref 0.00–0.08)

## 2012-05-26 LAB — CK TOTAL AND CKMB (NOT AT ARMC)
CK, MB: 1.9 ng/mL (ref 0.3–4.0)
Relative Index: 1.1 (ref 0.0–2.5)
Total CK: 174 U/L (ref 7–177)

## 2012-05-26 LAB — TROPONIN I: Troponin I: <0.3 ng/mL (ref <0.30)

## 2012-05-26 LAB — PRO B NATRIURETIC PEPTIDE: Pro B Natriuretic peptide (BNP): 55.5 pg/mL (ref 0–125)

## 2012-05-26 MED ORDER — METOCLOPRAMIDE HCL 5 MG/ML IJ SOLN
10.0000 mg | Freq: Once | INTRAMUSCULAR | Status: DC
  Filled 2012-05-26: qty 2

## 2012-05-26 MED ORDER — METOCLOPRAMIDE HCL 5 MG/ML IJ SOLN
10.0000 mg | Freq: Once | INTRAMUSCULAR | Status: AC
  Administered 2012-05-26: 10 mg via INTRAVENOUS

## 2012-05-26 MED ORDER — DIPHENHYDRAMINE HCL 50 MG/ML IJ SOLN
25.0000 mg | Freq: Once | INTRAMUSCULAR | Status: AC
  Administered 2012-05-26: 25 mg via INTRAVENOUS
  Filled 2012-05-26: qty 1

## 2012-05-26 MED ORDER — KETOROLAC TROMETHAMINE 30 MG/ML IJ SOLN
30.0000 mg | Freq: Once | INTRAMUSCULAR | Status: AC
  Administered 2012-05-26: 30 mg via INTRAVENOUS
  Filled 2012-05-26: qty 1

## 2012-05-26 MED ORDER — PROCHLORPERAZINE EDISYLATE 5 MG/ML IJ SOLN: 10.0000 mg | Freq: Once | INTRAMUSCULAR | Status: DC

## 2012-05-26 MED ORDER — SODIUM CHLORIDE 0.9 % IV BOLUS (SEPSIS)
1000.0000 mL | Freq: Once | INTRAVENOUS | Status: AC
  Administered 2012-05-26: 1000 mL via INTRAVENOUS

## 2012-05-26 NOTE — ED Notes (Signed)
Pt reports 4 days of intermittent chest pain and SOB. Described as a pulling sensation. Pt with hx of cardiomyopathy. Pt c/o headache and generalized body aches as well. Denies cough, fever, sore throat symptoms.

## 2012-05-26 NOTE — ED Provider Notes (Signed)
History     CSN: [id number]  Arrival date & time 05/26/12  1437   First MD Initiated Contact with Patient 05/26/12 1747      Chief Complaint  Patient presents with  . Chest Pain    (Consider location/radiation/quality/duration/timing/severity/associated sxs/prior treatment) Patient is a 37 y.o. female presenting with general illness. The history is provided by the patient. No language interpreter was used.  Illness  The current episode started 3 to 5 days ago. The problem occurs continuously. The problem has been unchanged. The problem is moderate. Nothing relieves the symptoms. Nothing aggravates the symptoms. Associated symptoms include nausea and headaches. Pertinent negatives include no fever, no abdominal pain, no constipation, no diarrhea, no vomiting, no congestion, no sore throat, no cough and no rash.    Past Medical History  Diagnosis Date  . Cardiomyopathy   . Hypertension   . Ovarian cyst     Past Surgical History  Procedure Date  . Abdominal surgery   . Cesarean section     History reviewed. No pertinent family history.  History  Substance Use Topics  . Smoking status: Not on file  . Smokeless tobacco: Not on file  . Alcohol Use:     OB History    Grav Para Term Preterm Abortions TAB SAB Ect Mult Living                  Review of Systems  Constitutional: Negative for fever and chills.  HENT: Negative for congestion and sore throat.   Respiratory: Positive for shortness of breath. Negative for cough.   Cardiovascular: Positive for chest pain. Negative for leg swelling.  Gastrointestinal: Positive for nausea. Negative for vomiting, abdominal pain, diarrhea and constipation.  Genitourinary: Negative for dysuria and frequency.  Skin: Negative for color change and rash.  Neurological: Positive for headaches. Negative for dizziness.  Psychiatric/Behavioral: Negative for confusion and agitation.  All other systems reviewed and are  negative.    Allergies  Aspirin; Ivp dye; Vancomycin; and Ciprofloxacin  Home Medications   Current Outpatient Rx  Name  Route  Sig  Dispense  Refill  . COLESEVELAM HCL 625 MG PO TABS   Oral   Take 1,875 mg by mouth daily.         Marland Kitchen DICYCLOMINE HCL 10 MG PO CAPS   Oral   Take 10 mg by mouth 3 (three) times daily.         Marland Kitchen HYDROCHLOROTHIAZIDE 12.5 MG PO CAPS   Oral   Take 12.5 mg by mouth daily.         Marland Kitchen METOPROLOL SUCCINATE ER 50 MG PO TB24   Oral   Take 50 mg by mouth daily. Take with or immediately following a meal.         . ONDANSETRON 4 MG PO TBDP   Oral   Take 4 mg by mouth every 8 (eight) hours as needed.         Marland Kitchen VALSARTAN-HYDROCHLOROTHIAZIDE 160-12.5 MG PO TABS   Oral   Take 1 tablet by mouth daily.           BP 131/73  Pulse 95  Temp 98.1 F (36.7 C) (Oral)  Resp 18  SpO2 95%  LMP 05/17/2012  Physical Exam  Vitals reviewed. Constitutional: She is oriented to person, place, and time. She appears well-developed and well-nourished. No distress.  HENT:  Head: Normocephalic and atraumatic.  Eyes: EOM are normal. Pupils are equal, round, and reactive to light.  Neck:  Normal range of motion. Neck supple.  Cardiovascular: Normal rate and regular rhythm.   Pulses:      Radial pulses are 2+ on the right side, and 2+ on the left side.  Pulmonary/Chest: Effort normal and breath sounds normal. No respiratory distress.  Abdominal: Soft. She exhibits no distension.  Musculoskeletal: Normal range of motion. She exhibits no edema.       Right lower leg: She exhibits no swelling and no edema.       Left lower leg: She exhibits no swelling and no edema.  Neurological: She is alert and oriented to person, place, and time. She has normal strength. No cranial nerve deficit. Coordination normal. GCS eye subscore is 4. GCS verbal subscore is 5. GCS motor subscore is 6.  Skin: Skin is warm and dry.  Psychiatric: She has a normal mood and affect. Her  behavior is normal.    ED Course  Procedures (including critical care time)  Labs Reviewed  BASIC METABOLIC PANEL - Abnormal; Notable for the following:    Chloride 95 (*)     All other components within normal limits  CBC  PRO B NATRIURETIC PEPTIDE  POCT I-STAT TROPONIN I   Results for orders placed during the hospital encounter of 05/26/12  CBC      Component Value Range   WBC 7.4  4.0 - 10.5 K/uL   RBC 5.01  3.87 - 5.11 MIL/uL   Hemoglobin 14.3  12.0 - 15.0 g/dL   HCT 16.1  09.6 - 04.5 %   MCV 86.2  78.0 - 100.0 fL   MCH 28.5  26.0 - 34.0 pg   MCHC 33.1  30.0 - 36.0 g/dL   RDW 40.9  81.1 - 91.4 %   Platelets 303  150 - 400 K/uL  BASIC METABOLIC PANEL      Component Value Range   Sodium 135  135 - 145 mEq/L   Potassium 4.8  3.5 - 5.1 mEq/L   Chloride 95 (*) 96 - 112 mEq/L   CO2 26  19 - 32 mEq/L   Glucose, Bld 95  70 - 99 mg/dL   BUN 8  6 - 23 mg/dL   Creatinine, Ser 7.82  0.50 - 1.10 mg/dL   Calcium 95.6  8.4 - 21.3 mg/dL   GFR calc non Af Amer >90  >90 mL/min   GFR calc Af Amer >90  >90 mL/min  PRO B NATRIURETIC PEPTIDE      Component Value Range   Pro B Natriuretic peptide (BNP) 55.5  0 - 125 pg/mL  POCT I-STAT TROPONIN I      Component Value Range   Troponin i, poc 0.00  0.00 - 0.08 ng/mL   Comment 3           CK TOTAL AND CKMB      Component Value Range   Total CK 174  7 - 177 U/L   CK, MB 1.9  0.3 - 4.0 ng/mL   Relative Index 1.1  0.0 - 2.5  TROPONIN I      Component Value Range   Troponin I <0.30  <0.30 ng/mL   DG Chest 2 View (Final result)   Result time:05/26/12 1930    Final result by Rad Results In Interface (05/26/12 19:30:33)    Narrative:   *RADIOLOGY REPORT*  Clinical Data: Chest pain  CHEST - 2 VIEW  Comparison: None  Findings: The heart size and mediastinal contours are within normal limits. Both lungs are  clear. The visualized skeletal structures are unremarkable.  IMPRESSION: Negative exam.   Original Report Authenticated  By: Signa Kell, M.D.     Date: 05/27/2012  Rate: 97  Rhythm: normal sinus rhythm  QRS Axis: normal  Intervals: normal  ST/T Wave abnormalities: normal  Conduction Disutrbances:none  Narrative Interpretation:   Old EKG Reviewed: none available   No results found.   No diagnosis found.    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 Chest pain has been constant, substernal and left sided, aching/pressure w/ supine position and pulling sensation w/ exertion. Sx improved w/ sitting forward. A/w dyspnea, nausea, neck pain. Denies cough, fever, no hx of DVT/PE, no LE edema or orthopnea. No hx of CAD, non smoker, + family Hx of CAD. TIMI 0. Low risk wells, PERC neg. No pleuritic chest pain. Headache is right sided pressure, gradual onset, constant, not worst of life or thunderclap, no AMS or seizure activity. No fever. + photophobia and nausea.   Exam: afebrile, normotensive, HR 90, no resp distress or hypoxia. Normal neuro exam, no meningeal signs, lungs CTAB, chest wall nttp, no pulse deficit, no LE edema, no JVD.  DDx: headache likely benign - doubt CVA, meningitis, SAH, pseudotumor or VST. Chest pain possibly 2/2 cardiomyopathy. Not likely ACS or PE. Possible pericarditis or myocarditis. Doubt CHF, spont ptx, tamponade.  Plan: will check ECG, CXR, troponin, BNP, CK, cbc, BMP, will give reglan, benadryl and toradol. Will refrain from CT head at this time in light of normal neuro exam.   Course: reassessed, vitals stable, NAD, chest pain and headache resolved. Troponin x 2 neg, CK and CKMB neg, BNP normal CBC and BMP unremarkable, CXR - NACPF. ECG - no acute ischemia. At this time does not appear to CHF, cardiomyopathy, pericarditis, or ACS at this time. Stable for d/c home. Recommend follow up w/ pcp in 1-2 days, and call  cardiology for first available appointment. Given return precautions and follow up instructions. Source of pts chest pain and headache uncertain at this time - possibly 2/2 viral illness as pt admits to myalgia. At this time her headache and chest pain are likely benign in nature.   1. Headache   2. Chest pain    New Prescriptions   No medications on file   Sigmund Hazel, MD [address] [place] [address] [phone]  Schedule an appointment as soon as possible for a visit on 05/27/2012      [facility] EMERGENCY DEPARTMENT [address] [id number] [place] [address] [phone]           Audelia Hives, MD 05/27/12 [phone]

## 2012-05-28 NOTE — ED Provider Notes (Signed)
I have supervised the resident on the management of this patient and agree with the note above. I personally interviewed and examined the patient and my addendum is below.   [name] is a 37 y.o. female hx of post partum cardiomyopathy here with CP, SOB. Symptoms worse with walking and laying down. Also has some intermittent headaches. Vitals stable. DDx includes cardiomyopathy or heart failure vs ACS. She doesn't appear in heart failure on exam. Trop neg x 2, CK nl, BNP nl. I discussed with Dr. Katrinka Blazing, her cardiologist, who agrees that she can see him in the office.    Richardean Canal, MD 05/28/12 [phone]

## 2012-07-09 ENCOUNTER — Other Ambulatory Visit: Payer: Self-pay

## 2012-12-28 ENCOUNTER — Ambulatory Visit (INDEPENDENT_AMBULATORY_CARE_PROVIDER_SITE_OTHER): Payer: 59 | Admitting: Internal Medicine

## 2012-12-28 ENCOUNTER — Ambulatory Visit (INDEPENDENT_AMBULATORY_CARE_PROVIDER_SITE_OTHER)
Admission: RE | Admit: 2012-12-28 | Discharge: 2012-12-28 | Disposition: A | Discharge status: Home / Self Care | Payer: 59 | Source: Ambulatory Visit | Attending: Internal Medicine | Admitting: Internal Medicine

## 2012-12-28 ENCOUNTER — Other Ambulatory Visit (INDEPENDENT_AMBULATORY_CARE_PROVIDER_SITE_OTHER): Payer: 59

## 2012-12-28 ENCOUNTER — Encounter: Payer: Self-pay | Admitting: Internal Medicine

## 2012-12-28 VITALS — BP 128/92 | HR 102 | Temp 97.5°F | Ht 65.0 in | Wt 255.0 lb

## 2012-12-28 DIAGNOSIS — R0600 Dyspnea, unspecified: Secondary | ICD-10-CM

## 2012-12-28 DIAGNOSIS — R0609 Other forms of dyspnea: Secondary | ICD-10-CM

## 2012-12-28 DIAGNOSIS — R0989 Other specified symptoms and signs involving the circulatory and respiratory systems: Secondary | ICD-10-CM

## 2012-12-28 DIAGNOSIS — I10 Essential (primary) hypertension: Secondary | ICD-10-CM

## 2012-12-28 LAB — CBC WITH DIFFERENTIAL/PLATELET
Basophils Absolute: 0 10*3/uL (ref 0.0–0.1)
Basophils Relative: 0.5 % (ref 0.0–3.0)
Eosinophils Absolute: 0.1 10*3/uL (ref 0.0–0.7)
Eosinophils Relative: 1.4 % (ref 0.0–5.0)
HCT: 37.4 % (ref 36.0–46.0)
Hemoglobin: 12.3 g/dL (ref 12.0–15.0)
Lymphocytes Relative: 39.4 % (ref 12.0–46.0)
Lymphs Abs: 2.3 10*3/uL (ref 0.7–4.0)
MCHC: 33 g/dL (ref 30.0–36.0)
MCV: 85.9 fl (ref 78.0–100.0)
Monocytes Absolute: 0.4 10*3/uL (ref 0.1–1.0)
Monocytes Relative: 6.2 % (ref 3.0–12.0)
Neutro Abs: 3.1 10*3/uL (ref 1.4–7.7)
Neutrophils Relative %: 52.5 % (ref 43.0–77.0)
Platelets: 238 10*3/uL (ref 150.0–400.0)
RBC: 4.35 Mil/uL (ref 3.87–5.11)
RDW: 14.4 % (ref 11.5–14.6)
WBC: 5.8 10*3/uL (ref 4.5–10.5)

## 2012-12-28 LAB — BASIC METABOLIC PANEL
BUN: 7 mg/dL (ref 6–23)
CO2: 28 mEq/L (ref 19–32)
Calcium: 9.2 mg/dL (ref 8.4–10.5)
Chloride: 106 mEq/L (ref 96–112)
Creatinine, Ser: 0.8 mg/dL (ref 0.4–1.2)
GFR: 109.1 mL/min (ref >60.00)
Glucose, Bld: 102 mg/dL — ABNORMAL HIGH (ref 70–99)
Potassium: 3.7 mEq/L (ref 3.5–5.1)
Sodium: 139 mEq/L (ref 135–145)

## 2012-12-28 LAB — TSH: TSH: 1.8 u[IU]/mL (ref 0.35–5.50)

## 2012-12-28 LAB — BRAIN NATRIURETIC PEPTIDE: Pro B Natriuretic peptide (BNP): 53 pg/mL (ref 0.0–100.0)

## 2012-12-28 LAB — D-DIMER, QUANTITATIVE: D-Dimer, Quant: 0.74 ug/mL-FEU — ABNORMAL HIGH (ref 0.00–0.48)

## 2012-12-28 NOTE — Patient Instructions (Addendum)
Please remember to go to the lab and x-ray department downstairs for your tests - we will call you with the results when they are available.  You will need to be very careful with calorie intake to place yourself in neg calorie    Please schedule a follow up office visit in 4- 6 weeks, call sooner if needed pfts

## 2012-12-28 NOTE — Progress Notes (Signed)
  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  12/28/2012 1st pulmonary eval/ [name] cc indolent onset progressive doe x 15 years gradually worse to point where has to stop at top one flight of steps or length of parking lot, assoc with noct dry cough esp if too flat   No obvious daytime variabilty or assoc cp or chest tightness, subjective wheeze overt sinus or hb symptoms. No unusual exp hx or h/o childhood pna/ asthma or knowledge of premature birth.   Sleeping ok s cpap without nocturnal  or early am exacerbation  of respiratory  c/o's or need for noct saba or daytime hypersomnolence. Also denies any obvious fluctuation of symptoms with weather or environmental changes or other aggravating or alleviating factors except as outlined above    Review of Systems  Constitutional: Negative for fever, chills and unexpected weight change.  HENT: Positive for congestion. Negative for ear pain, nosebleeds, sore throat, rhinorrhea, sneezing, trouble swallowing, dental problem, voice change, postnasal drip and sinus pressure.   Eyes: Negative for visual disturbance.  Respiratory: Positive for cough and shortness of breath. Negative for choking.   Cardiovascular: Negative for chest pain and leg swelling.  Gastrointestinal: Positive for abdominal pain. Negative for vomiting and diarrhea.  Genitourinary: Negative for difficulty urinating.  Musculoskeletal: Negative for arthralgias.  Skin: Negative for rash.  Neurological: Positive for headaches. Negative for tremors and syncope.  Hematological: Does not bruise/bleed easily.       Objective:   Physical Exam  Baseline wt 165-170 before post partum cm  Wt Readings from Last 3 Encounters:  12/28/12 255 lb  (115.667 kg)     HEENT: nl dentition, turbinates, and orophanx. Nl external ear canals without cough reflex   NECK :  without JVD/Nodes/TM/ nl carotid upstrokes bilaterally   LUNGS: no acc muscle use, clear to A and P bilaterally without cough on insp or exp maneuvers   CV:  RRR  no s3 or murmur or increase in P2, no edema   ABD:  soft and nontender with nl excursion in the supine position. No bruits or organomegaly, bowel sounds nl  MS:  warm without deformities, calf tenderness, cyanosis or clubbing  SKIN: warm and dry without lesions    NEURO:  alert, approp, no deficits     cxr 12/28/12 Mild enlargement of cardiac silhouette.  No acute abnormalities.       Assessment & Plan:

## 2012-12-29 ENCOUNTER — Other Ambulatory Visit: Payer: Self-pay | Admitting: Internal Medicine

## 2012-12-29 DIAGNOSIS — R0600 Dyspnea, unspecified: Secondary | ICD-10-CM

## 2012-12-29 DIAGNOSIS — I10 Essential (primary) hypertension: Secondary | ICD-10-CM | POA: Insufficient documentation

## 2012-12-29 DIAGNOSIS — R0602 Shortness of breath: Secondary | ICD-10-CM

## 2012-12-29 DIAGNOSIS — R7989 Other specified abnormal findings of blood chemistry: Secondary | ICD-10-CM

## 2012-12-29 NOTE — Progress Notes (Signed)
Quick Note:  Spoke with pt and notified of results per Dr. Wert. Pt verbalized understanding and denied any questions.  ______ 

## 2012-12-29 NOTE — Assessment & Plan Note (Addendum)
-   12/28/2012  Walked RA x 3 laps @ 185 ft each stopped due to end of study, no sob but 02 sats fell from 96 to 93  - Labs 12/28/12 ok including bnp x for d dimer up at 0.74 > cta and bilateral venous dopplers rec 12/29/2012   Will need pft's to complete the w/u but suspect this is obesity / deconditioning with suboptimal bp control in setting of known CM > defer this rx to Dr Verdis Prime  In meantime work on wt loss, f/u with pft's

## 2012-12-30 ENCOUNTER — Ambulatory Visit (HOSPITAL_COMMUNITY)
Admission: RE | Admit: 2012-12-30 | Discharge: 2012-12-30 | Disposition: A | Discharge status: Home / Self Care | Payer: 59 | Source: Ambulatory Visit | Attending: Internal Medicine | Admitting: Internal Medicine

## 2012-12-30 ENCOUNTER — Encounter: Payer: Self-pay | Admitting: Internal Medicine

## 2012-12-30 ENCOUNTER — Encounter (INDEPENDENT_AMBULATORY_CARE_PROVIDER_SITE_OTHER): Payer: 59

## 2012-12-30 DIAGNOSIS — R0602 Shortness of breath: Secondary | ICD-10-CM | POA: Insufficient documentation

## 2012-12-30 DIAGNOSIS — R0600 Dyspnea, unspecified: Secondary | ICD-10-CM

## 2012-12-30 DIAGNOSIS — R7989 Other specified abnormal findings of blood chemistry: Secondary | ICD-10-CM

## 2012-12-30 DIAGNOSIS — M7989 Other specified soft tissue disorders: Secondary | ICD-10-CM

## 2012-12-30 DIAGNOSIS — R799 Abnormal finding of blood chemistry, unspecified: Secondary | ICD-10-CM | POA: Insufficient documentation

## 2012-12-30 MED ORDER — TECHNETIUM TC 99M DIETHYLENETRIAME-PENTAACETIC ACID: 40.0000 | Freq: Once | INTRAVENOUS | Status: AC | PRN

## 2012-12-30 MED ORDER — TECHNETIUM TO 99M ALBUMIN AGGREGATED
6.0000 | Freq: Once | INTRAVENOUS | Status: AC | PRN
  Administered 2012-12-30: 6 via INTRAVENOUS

## 2012-12-30 NOTE — Progress Notes (Signed)
Quick Note:  Spoke with pt and notified of results per Dr. Wert. Pt verbalized understanding and denied any questions.  ______ 

## 2013-01-01 ENCOUNTER — Other Ambulatory Visit: Payer: Self-pay

## 2013-01-01 ENCOUNTER — Encounter (HOSPITAL_COMMUNITY): Payer: Self-pay | Admitting: Emergency Medicine

## 2013-01-01 ENCOUNTER — Observation Stay (HOSPITAL_COMMUNITY)
Admission: EM | Admit: 2013-01-01 | Discharge: 2013-01-04 | Disposition: A | Discharge status: Home / Self Care | Payer: 59 | Attending: Internal Medicine | Admitting: Internal Medicine

## 2013-01-01 DIAGNOSIS — R079 Chest pain, unspecified: Secondary | ICD-10-CM | POA: Insufficient documentation

## 2013-01-01 DIAGNOSIS — K589 Irritable bowel syndrome without diarrhea: Secondary | ICD-10-CM | POA: Insufficient documentation

## 2013-01-01 DIAGNOSIS — I10 Essential (primary) hypertension: Secondary | ICD-10-CM | POA: Insufficient documentation

## 2013-01-01 DIAGNOSIS — E669 Obesity, unspecified: Secondary | ICD-10-CM

## 2013-01-01 DIAGNOSIS — R0600 Dyspnea, unspecified: Secondary | ICD-10-CM

## 2013-01-01 DIAGNOSIS — R0609 Other forms of dyspnea: Principal | ICD-10-CM | POA: Insufficient documentation

## 2013-01-01 DIAGNOSIS — O903 Peripartum cardiomyopathy: Secondary | ICD-10-CM | POA: Insufficient documentation

## 2013-01-01 DIAGNOSIS — R0989 Other specified symptoms and signs involving the circulatory and respiratory systems: Principal | ICD-10-CM | POA: Insufficient documentation

## 2013-01-01 DIAGNOSIS — G4733 Obstructive sleep apnea (adult) (pediatric): Secondary | ICD-10-CM | POA: Insufficient documentation

## 2013-01-01 DIAGNOSIS — M7989 Other specified soft tissue disorders: Secondary | ICD-10-CM | POA: Insufficient documentation

## 2013-01-01 HISTORY — DX: Irritable bowel syndrome, unspecified: K58.9

## 2013-01-01 LAB — POCT I-STAT TROPONIN I: Troponin i, poc: 0 ng/mL (ref 0.00–0.08)

## 2013-01-01 LAB — CBC
HCT: 39.8 % (ref 36.0–46.0)
Hemoglobin: 13.5 g/dL (ref 12.0–15.0)
MCH: 29.3 pg (ref 26.0–34.0)
MCHC: 33.9 g/dL (ref 30.0–36.0)
MCV: 86.3 fL (ref 78.0–100.0)
Platelets: 288 10*3/uL (ref 150–400)
RBC: 4.61 MIL/uL (ref 3.87–5.11)
RDW: 13.8 % (ref 11.5–15.5)
WBC: 9.1 10*3/uL (ref 4.0–10.5)

## 2013-01-01 LAB — POCT PREGNANCY, URINE: Preg Test, Ur: NEGATIVE

## 2013-01-01 LAB — BASIC METABOLIC PANEL
BUN: 17 mg/dL (ref 6–23)
CO2: 28 mEq/L (ref 19–32)
Calcium: 9.6 mg/dL (ref 8.4–10.5)
Chloride: 96 mEq/L (ref 96–112)
Creatinine, Ser: 1.03 mg/dL (ref 0.50–1.10)
GFR calc Af Amer: 79 mL/min — ABNORMAL LOW (ref >90)
GFR calc non Af Amer: 68 mL/min — ABNORMAL LOW (ref >90)
Glucose, Bld: 105 mg/dL — ABNORMAL HIGH (ref 70–99)
Potassium: 3.5 mEq/L (ref 3.5–5.1)
Sodium: 135 mEq/L (ref 135–145)

## 2013-01-01 LAB — PRO B NATRIURETIC PEPTIDE: Pro B Natriuretic peptide (BNP): 16.9 pg/mL (ref 0–125)

## 2013-01-01 NOTE — ED Notes (Signed)
C/o pinching pain to L chest, sob, dizziness, nausea, and generalized weakness since 5pm. Pt states she was seen at [facility] for sob and had lower extremity doppler at [facility] 2 days ago to r/o DVT.

## 2013-01-02 ENCOUNTER — Observation Stay (HOSPITAL_COMMUNITY): Payer: 59

## 2013-01-02 DIAGNOSIS — I10 Essential (primary) hypertension: Secondary | ICD-10-CM

## 2013-01-02 DIAGNOSIS — G4733 Obstructive sleep apnea (adult) (pediatric): Secondary | ICD-10-CM | POA: Diagnosis present

## 2013-01-02 DIAGNOSIS — R079 Chest pain, unspecified: Secondary | ICD-10-CM | POA: Diagnosis present

## 2013-01-02 DIAGNOSIS — O903 Peripartum cardiomyopathy: Secondary | ICD-10-CM | POA: Diagnosis present

## 2013-01-02 HISTORY — DX: Obstructive sleep apnea (adult) (pediatric): G47.33

## 2013-01-02 LAB — TROPONIN I: Troponin I: <0.3 ng/mL (ref <0.30)

## 2013-01-02 MED ORDER — FLUTICASONE PROPIONATE 50 MCG/ACT NA SUSP
2.0000 | Freq: Every day | NASAL | Status: DC
  Administered 2013-01-03 – 2013-01-04 (×2): 2 via NASAL
  Filled 2013-01-02 (×2): qty 16

## 2013-01-02 MED ORDER — SODIUM CHLORIDE 0.9 % IV SOLN: 250.0000 mL | INTRAVENOUS | Status: DC | PRN

## 2013-01-02 MED ORDER — POLYETHYLENE GLYCOL 3350 17 G PO PACK
17.0000 g | PACK | Freq: Every day | ORAL | Status: DC | PRN
  Administered 2013-01-02 – 2013-01-03 (×2): 17 g via ORAL
  Filled 2013-01-02 (×2): qty 1

## 2013-01-02 MED ORDER — TRAMADOL HCL 50 MG PO TABS
50.0000 mg | ORAL_TABLET | Freq: Four times a day (QID) | ORAL | Status: DC | PRN
  Administered 2013-01-02 – 2013-01-04 (×3): 50 mg via ORAL
  Filled 2013-01-02 (×3): qty 1

## 2013-01-02 MED ORDER — POLYETHYLENE GLYCOL 3350 17 G PO PACK
17.0000 g | PACK | Freq: Every day | ORAL | Status: DC
  Filled 2013-01-02: qty 1

## 2013-01-02 MED ORDER — FUROSEMIDE 10 MG/ML IJ SOLN
20.0000 mg | Freq: Once | INTRAMUSCULAR | Status: AC
  Administered 2013-01-02: 20 mg via INTRAVENOUS

## 2013-01-02 MED ORDER — AMLODIPINE BESYLATE 5 MG PO TABS
5.0000 mg | ORAL_TABLET | Freq: Every day | ORAL | Status: DC
  Administered 2013-01-02 – 2013-01-04 (×3): 5 mg via ORAL
  Filled 2013-01-02 (×3): qty 1

## 2013-01-02 MED ORDER — DIAZEPAM 5 MG PO TABS
10.0000 mg | ORAL_TABLET | Freq: Once | ORAL | Status: AC
  Administered 2013-01-02: 10 mg via ORAL
  Filled 2013-01-02: qty 2

## 2013-01-02 MED ORDER — HYDROCHLOROTHIAZIDE 50 MG PO TABS
50.0000 mg | ORAL_TABLET | Freq: Every day | ORAL | Status: DC
  Administered 2013-01-02 – 2013-01-04 (×3): 50 mg via ORAL
  Filled 2013-01-02 (×3): qty 1

## 2013-01-02 MED ORDER — ASPIRIN EC 81 MG PO TBEC: 81.0000 mg | DELAYED_RELEASE_TABLET | Freq: Every day | ORAL | Status: DC

## 2013-01-02 MED ORDER — SODIUM CHLORIDE 0.9 % IJ SOLN: 3.0000 mL | INTRAMUSCULAR | Status: DC | PRN

## 2013-01-02 MED ORDER — SODIUM CHLORIDE 0.9 % IJ SOLN
3.0000 mL | Freq: Two times a day (BID) | INTRAMUSCULAR | Status: DC
  Administered 2013-01-02 – 2013-01-04 (×4): 3 mL via INTRAVENOUS

## 2013-01-02 MED ORDER — SODIUM CHLORIDE 0.9 % IJ SOLN: 3.0000 mL | Freq: Two times a day (BID) | INTRAMUSCULAR | Status: DC

## 2013-01-02 MED ORDER — CARVEDILOL 25 MG PO TABS
25.0000 mg | ORAL_TABLET | Freq: Every day | ORAL | Status: DC
  Administered 2013-01-02 – 2013-01-04 (×3): 25 mg via ORAL
  Filled 2013-01-02 (×3): qty 1

## 2013-01-02 MED ORDER — ACETAMINOPHEN 325 MG PO TABS
650.0000 mg | ORAL_TABLET | Freq: Four times a day (QID) | ORAL | Status: DC | PRN
Start: 2013-01-02 — End: 2013-01-04
  Administered 2013-01-02 – 2013-01-04 (×3): 650 mg via ORAL
  Filled 2013-01-02 (×2): qty 2

## 2013-01-02 NOTE — Progress Notes (Signed)
[facility] PROGRESS NOTE  SHAYNA EBLEN ZOX:[id number] DOB: 06-May-1976 DOA: 01/01/2013 PCP: [name], MD  Assessment/Plan: Dyspnea: - Normal BNP - Awaiting 2D echo - s/p lasix last night - CXR unremarkable - VQ very low prob for PE - Would f/u Cardiology recs HTN: - stable and controlled - Cont current regimen OSA: - Cont CPAP as tolerated Postpartum Cariomyopathy: - 2D echo pending Chest Pain: - Thus far, cardiac biomarkers neg x2 - On further questioning, "chest pain" appears to be at the L lower ribs anteriorly. Also complains of nausea with food and early satiety with increased flatus. Reports diarrhea and previous bouts of constipation. - Will therefore obtain KUB to r/o constipation which may present as "chest pain"  Code Status: Full Family Communication: Pt in room (indicate person spoken with, relationship, and if by phone, the number) Disposition Plan: Pending   Consultants:  Cardiology  HPI/Subjective: No major complaints. Feels somewhat better today  Objective: Filed Vitals:   01/01/13 2252 01/02/13 0050 01/02/13 0220 01/02/13 0431  BP: 104/78  118/68 121/76  Pulse: 97 96 83 86  Temp: 98.2 F (36.8 C)  97.9 F (36.6 C) 97.6 F (36.4 C)  TempSrc: Oral  Oral Oral  Resp: 18  18 18   Height:    5\' 5"  (1.651 m)  Weight:    113.49 kg (250 lb 3.2 oz)  SpO2: 98% 100% 99% 99%   No intake or output data in the 24 hours ending 01/02/13 1246 Filed Weights   01/02/13 0431  Weight: 113.49 kg (250 lb 3.2 oz)    Exam:   General:  Awake, in nad  Cardiovascular: regular, s1, s2  Respiratory: normal resp effort, no wheezing  Abdomen: obese, pos bowel sounds, nondistended  Musculoskeletal: perfused, no clubbing   Data Reviewed: Basic Metabolic Panel:  Recent Labs Lab 12/28/12 1128 01/01/13 2310  NA 139 135  K 3.7 3.5  CL 106 96  CO2 28 28  GLUCOSE 102* 105*  BUN 7 17  CREATININE 0.8 1.03  CALCIUM 9.2 9.6   Liver Function  Tests: No results found for this basename: AST, ALT, ALKPHOS, BILITOT, PROT, ALBUMIN,  in the last 168 hours No results found for this basename: LIPASE, AMYLASE,  in the last 168 hours No results found for this basename: AMMONIA,  in the last 168 hours CBC:  Recent Labs Lab 12/28/12 1128 01/01/13 2310  WBC 5.8 9.1  NEUTROABS 3.1  --   HGB 12.3 13.5  HCT 37.4 39.8  MCV 85.9 86.3  PLT 238.0 288   Cardiac Enzymes:  Recent Labs Lab 01/02/13 0720  TROPONINI <0.30   BNP (last 3 results)  Recent Labs  05/26/12 1513 12/28/12 1128 01/01/13 2310  PROBNP 55.5 53.0 16.9   CBG: No results found for this basename: GLUCAP,  in the last 168 hours  No results found for this or any previous visit (from the past 240 hour(s)).   Studies: No results found.  Scheduled Meds: . amLODipine  5 mg Oral Daily  . carvedilol  25 mg Oral Daily  . hydrochlorothiazide  50 mg Oral Daily  . sodium chloride  3 mL Intravenous Q12H  . sodium chloride  3 mL Intravenous Q12H   Continuous Infusions:   Principal Problem:   Dyspnea Active Problems:   HBP (high blood pressure)   Leg swelling   OSA on CPAP   Postpartum cardiomyopathy   Chest pain    Time spent:    CHIU, STEPHEN K  [facility] Pager [phone]. If 7PM-7AM, please contact night-coverage at [email or web address], password [facility] 01/02/2013, 12:46 PM  LOS: 1 day

## 2013-01-02 NOTE — ED Notes (Signed)
The pt  Is c/o of pain under her lt breast   All week.  She has been seen at her doctors office and had every test performed with no diagnosis.  She reports sob none now.  No distress

## 2013-01-02 NOTE — ED Notes (Signed)
The pts 02 sats were 100 % with walking p88

## 2013-01-02 NOTE — ED Provider Notes (Signed)
CSN: [id number]     Arrival date & time 01/01/13  2243 History     First MD Initiated Contact with Patient 01/02/13 0007     Chief Complaint  Patient presents with  . Chest Pain   (Consider location/radiation/quality/duration/timing/severity/associated sxs/prior Treatment) HPI Hx per PT - onset a few days ago worse today.  Began with SOB and CP  - under L breast feels like a pinchng and pulling pain with h/o same with CM.  Evaluated by her Cardiologist 3 days ago for these symptoms and was sent here for DVT study, was referred to Pulmonologist and has scheduled sleep studies and PFTs.  Symptoms worse tonight. No F/C. Tonight feels nauseated. No emesis. C/o weakness and fatigue. SOB worse with ambulation/ exertion.     PT had positive d-dimer 8/6 with neg DVT study and low prob VQ scan 12/29/12  Past Medical History  Diagnosis Date  . Cardiomyopathy   . Hypertension   . Ovarian cyst   . OSA (obstructive sleep apnea)     on CPAP   . IBS (irritable bowel syndrome)    Past Surgical History  Procedure Laterality Date  . Abdominal surgery    . Cesarean section    . Cholecystectomy    . Foot surgery     Family History  Problem Relation Age of Onset  . Emphysema Maternal Grandmother     smoked  . Allergies Daughter   . Heart disease Maternal Grandmother   . Rheum arthritis Mother    History  Substance Use Topics  . Smoking status: Never Smoker   . Smokeless tobacco: Never Used  . Alcohol Use: No   OB History   Grav Para Term Preterm Abortions TAB SAB Ect Mult Living                 Review of Systems  Constitutional: Positive for fatigue. Negative for fever and chills.  HENT: Negative for neck pain and neck stiffness.   Eyes: Negative for pain.  Respiratory: Positive for shortness of breath.   Cardiovascular: Positive for chest pain.  Gastrointestinal: Positive for nausea. Negative for vomiting and abdominal pain.  Genitourinary: Negative for flank pain.   Musculoskeletal: Negative for back pain.  Skin: Negative for rash.  Neurological: Positive for weakness. Negative for headaches.  All other systems reviewed and are negative.    Allergies  Aspirin; Ivp dye; Vancomycin; Ciprofloxacin; and Sulfa antibiotics  Home Medications   Current Outpatient Rx  Name  Route  Sig  Dispense  Refill  . acetaminophen (TYLENOL) 500 MG tablet   Oral   Take 1,000 mg by mouth every 6 (six) hours as needed for pain.         Marland Kitchen amLODipine (NORVASC) 5 MG tablet   Oral   Take 5 mg by mouth daily.         . carvedilol (COREG) 25 MG tablet   Oral   Take 25 mg by mouth daily.         . cholecalciferol (VITAMIN D) 1000 UNITS tablet   Oral   Take 1,000 Units by mouth daily.         . colesevelam (WELCHOL) 625 MG tablet   Oral   Take 625 mg by mouth daily.          . diazepam (VALIUM) 10 MG tablet   Oral   Take 10 mg by mouth daily.          Marland Kitchen dicyclomine (BENTYL) 10 MG  capsule   Oral   Take 10 mg by mouth 3 (three) times daily as needed (for abdominal cramping).          . hydrochlorothiazide (HYDRODIURIL) 50 MG tablet   Oral   Take 50 mg by mouth daily.         Marland Kitchen losartan (COZAAR) 100 MG tablet   Oral   Take 100 mg by mouth 2 (two) times daily.          . ondansetron (ZOFRAN-ODT) 4 MG disintegrating tablet   Oral   Take 4 mg by mouth every 8 (eight) hours as needed.         . promethazine (PHENERGAN) 25 MG tablet   Oral   Take 25 mg by mouth every 6 (six) hours as needed for nausea.          BP 104/78  Pulse 97  Temp(Src) 98.2 F (36.8 C) (Oral)  Resp 18  SpO2 98%  LMP 12/21/2012 Physical Exam  Constitutional: She is oriented to person, place, and time. She appears well-developed and well-nourished.  HENT:  Head: Normocephalic and atraumatic.  Eyes: Conjunctivae and EOM are normal. Pupils are equal, round, and reactive to light.  Neck: Neck supple.  Cardiovascular: Regular rhythm and intact distal  pulses.   Borderline tachycardic  Pulmonary/Chest: Effort normal. No respiratory distress.  Abdominal: Soft. Bowel sounds are normal. She exhibits no distension. There is no tenderness.  obese  Musculoskeletal: Normal range of motion. She exhibits no edema and no tenderness.  Neurological: She is alert and oriented to person, place, and time.  Skin: Skin is warm and dry.    ED Course   Procedures (including critical care time)  Results for orders placed during the hospital encounter of 01/01/13  CBC      Result Value Range   WBC 9.1  4.0 - 10.5 K/uL   RBC 4.61  3.87 - 5.11 MIL/uL   Hemoglobin 13.5  12.0 - 15.0 g/dL   HCT 45.4  09.8 - 11.9 %   MCV 86.3  78.0 - 100.0 fL   MCH 29.3  26.0 - 34.0 pg   MCHC 33.9  30.0 - 36.0 g/dL   RDW 14.7  82.9 - 56.2 %   Platelets 288  150 - 400 K/uL  BASIC METABOLIC PANEL      Result Value Range   Sodium 135  135 - 145 mEq/L   Potassium 3.5  3.5 - 5.1 mEq/L   Chloride 96  96 - 112 mEq/L   CO2 28  19 - 32 mEq/L   Glucose, Bld 105 (*) 70 - 99 mg/dL   BUN 17  6 - 23 mg/dL   Creatinine, Ser 1.30  0.50 - 1.10 mg/dL   Calcium 9.6  8.4 - 86.5 mg/dL   GFR calc non Af Amer 68 (*) >90 mL/min   GFR calc Af Amer 79 (*) >90 mL/min  PRO B NATRIURETIC PEPTIDE      Result Value Range   Pro B Natriuretic peptide (BNP) 16.9  0 - 125 pg/mL  POCT PREGNANCY, URINE      Result Value Range   Preg Test, Ur NEGATIVE  NEGATIVE  POCT I-STAT TROPONIN I      Result Value Range   Troponin i, poc 0.00  0.00 - 0.08 ng/mL   Comment 3            Dg Chest 2 View  12/30/2012   *RADIOLOGY REPORT*  Clinical Data: Shortness  of breath, elevated D-dimer  CHEST - 2 VIEW  Comparison: 12/28/2012  Findings: Cardiomediastinal silhouette is stable.  No acute infiltrate or pleural effusion.  No pulmonary edema.  Bony thorax is unremarkable.  IMPRESSION: No active disease.  No significant change.   Original Report Authenticated By: Natasha Mead, M.D.   Dg Chest 2 View  12/28/2012    *RADIOLOGY REPORT*  Clinical Data:  Shortness of breath, hypertension  CHEST - 2 VIEW  Comparison: 05/26/2012  Findings: Enlargement of cardiac silhouette. Mediastinal contours and pulmonary vascularity normal. Lungs clear. No pleural effusion or pneumothorax. Bones unremarkable. Surgical clips right upper quadrant question cholecystectomy.  IMPRESSION: Mild enlargement of cardiac silhouette. No acute abnormalities.   Original Report Authenticated By: Ulyses Southward, M.D.   Nm Pulmonary Per & Vent  12/30/2012   *RADIOLOGY REPORT*  Clinical Data: Shortness of breath  NM PULMONARY VENTILATION AND PERFUSION SCAN  Views:  Anterior, posterior, left lateral, right lateral, RPO, LPO, RAO, LAO - ventilation and perfusion  Radiopharmaceutical: Technetium 53m DTPA - ventilation; Technetium 60m perfusion - perfusion  Dose:  40.0 mCi - ventilation; 6.0 mCi - perfusion  Route of administration:  Inhalation - ventilation; intravenous - perfusion  Comparison:  Chest radiograph December 30, 2012  Findings: The ventilation study shows homogeneous and symmetric uptake of radiotracer bilaterally.  The perfusion study shows homogeneous and symmetric uptake of radiotracer bilaterally.  There is no appreciable ventilation / perfusion mismatch.  IMPRESSION: No ventilation or perfusion defects.  Very low probability of pulmonary embolus.   Original Report Authenticated By: Bretta Bang, M.D.       Date: 01/02/2013  Rate: 93  Rhythm: normal sinus rhythm  QRS Axis: normal  Intervals: normal  ST/T Wave abnormalities: nonspecific ST changes  Conduction Disutrbances:none  Narrative Interpretation:   Old EKG Reviewed: unchanged  1:09 AM d/w CAR Dr Terressa Koyanagi, recs MED admit, does not feel she needs emergent ECHO 2:03 AM Dr Onalee Hua to admit MDM  CP/ SOB worsening x 3 days, extensive outpatent work up ECG, labs, prior records reviewed CAR consulted MED admit    Sunnie Nielsen, MD 01/02/13 [phone]

## 2013-01-02 NOTE — ED Notes (Signed)
The pt is eating

## 2013-01-02 NOTE — Progress Notes (Signed)
Utilization review completed.  

## 2013-01-02 NOTE — ED Notes (Signed)
Admitting doctor in to see 

## 2013-01-02 NOTE — H&P (Signed)
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  She has seen her cardiologist and pulmonologist as outpt.  She has had a vq scan showing low prob, along with u/s le neg for dvt.  She is scheduled for pfts and repeat sleep study.  She has not been having any fevers. N/v.  No diarrhea.  Some occasional le swelling and hand swelling, only on hctz never been on any stronger diuretics.  No cough.  Cp free now.  Last known EF unknown no echo done in 2 years.    Review of Systems:  Positive and negative as per HPI otherwise all other systems are negative  Past Medical History: Past Medical History  Diagnosis Date  . Cardiomyopathy   . Hypertension   . Ovarian cyst   . OSA (obstructive sleep apnea)     on CPAP   . IBS (irritable bowel syndrome)    Past Surgical History  Procedure Laterality Date  . Abdominal surgery    . Cesarean section    . Cholecystectomy    . Foot surgery      Medications: Prior to Admission medications   Medication Sig Start Date End Date Taking? Authorizing Provider  acetaminophen (TYLENOL) 500 MG tablet Take 1,000 mg by mouth every 6 (six) hours as needed for pain.   Yes Historical Provider, MD  amLODipine (NORVASC) 5 MG tablet Take 5 mg by mouth daily.   Yes Historical Provider, MD  carvedilol (COREG) 25 MG tablet Take 25 mg by mouth daily.   Yes Historical Provider, MD  cholecalciferol (VITAMIN D) 1000 UNITS tablet Take 1,000 Units by mouth daily.   Yes Historical Provider, MD  colesevelam (WELCHOL) 625 MG tablet Take 625 mg by mouth daily.    Yes Historical Provider, MD  diazepam (VALIUM) 10 MG tablet Take 10 mg by mouth daily.    Yes Historical Provider, MD  dicyclomine (BENTYL) 10 MG capsule Take 10 mg by mouth 3 (three) times daily as needed (for abdominal cramping).    Yes  Historical Provider, MD  hydrochlorothiazide (HYDRODIURIL) 50 MG tablet Take 50 mg by mouth daily.   Yes Historical Provider, MD  losartan (COZAAR) 100 MG tablet Take 100 mg by mouth 2 (two) times daily.    Yes Historical Provider, MD  ondansetron (ZOFRAN-ODT) 4 MG disintegrating tablet Take 4 mg by mouth every 8 (eight) hours as needed.   Yes Historical Provider, MD  promethazine (PHENERGAN) 25 MG tablet Take 25 mg by mouth every 6 (six) hours as needed for nausea.   Yes Historical Provider, MD    Allergies:   Allergies  Allergen Reactions  . Aspirin Other (See Comments)    wheezing  . Ivp Dye (Iodinated Diagnostic Agents) Other (See Comments)    Did something to my liver and kidneys told not to use it  . Vancomycin Other (See Comments)    "did something to my kidneys"  . Ciprofloxacin Itching and Rash  . Sulfa Antibiotics Itching and Rash    Social History:  reports that she has never smoked. She has never used smokeless tobacco. She reports that she does not drink alcohol or use illicit drugs.  Family History: Family History  Problem Relation Age of Onset  . Emphysema Maternal Grandmother     smoked  . Allergies  Daughter   . Heart disease Maternal Grandmother   . Rheum arthritis Mother     Physical Exam: Filed Vitals:   01/01/13 2252 01/02/13 0050  BP: 104/78   Pulse: 97 96  Temp: 98.2 F (36.8 C)   TempSrc: Oral   Resp: 18   SpO2: 98% 100%   General appearance: alert, cooperative and no distress Head: Normocephalic, without obvious abnormality, atraumatic Eyes: negative Nose: Nares normal. Septum midline. Mucosa normal. No drainage or sinus tenderness. Neck: no JVD and supple, symmetrical, trachea midline Lungs: clear to auscultation bilaterally Heart: regular rate and rhythm, S1, S2 normal, no murmur, click, rub or gallop Abdomen: soft, non-tender; bowel sounds normal; no masses,  no organomegaly Extremities: extremities normal, atraumatic, no cyanosis or  edema Pulses: 2+ and symmetric Skin: Skin color, texture, turgor normal. No rashes or lesions Neurologic: Grossly normal    Labs on Admission:   Recent Labs  01/01/13 2310  NA 135  K 3.5  CL 96  CO2 28  GLUCOSE 105*  BUN 17  CREATININE 1.03  CALCIUM 9.6    Recent Labs  01/01/13 2310  WBC 9.1  HGB 13.5  HCT 39.8  MCV 86.3  PLT 288    Radiological Exams on Admission: Dg Chest 2 View  12/30/2012   *RADIOLOGY REPORT*  Clinical Data: Shortness of breath, elevated D-dimer  CHEST - 2 VIEW  Comparison: 12/28/2012  Findings: Cardiomediastinal silhouette is stable.  No acute infiltrate or pleural effusion.  No pulmonary edema.  Bony thorax is unremarkable.  IMPRESSION: No active disease.  No significant change.   Original Report Authenticated By: Natasha Mead, M.D.   Dg Chest 2 View  12/28/2012   *RADIOLOGY REPORT*  Clinical Data:  Shortness of breath, hypertension  CHEST - 2 VIEW  Comparison: 05/26/2012  Findings: Enlargement of cardiac silhouette. Mediastinal contours and pulmonary vascularity normal. Lungs clear. No pleural effusion or pneumothorax. Bones unremarkable. Surgical clips right upper quadrant question cholecystectomy.  IMPRESSION: Mild enlargement of cardiac silhouette. No acute abnormalities.   Original Report Authenticated By: Ulyses Southward, M.D.   Nm Pulmonary Per & Vent  12/30/2012   *RADIOLOGY REPORT*  Clinical Data: Shortness of breath  NM PULMONARY VENTILATION AND PERFUSION SCAN  Views:  Anterior, posterior, left lateral, right lateral, RPO, LPO, RAO, LAO - ventilation and perfusion  Radiopharmaceutical: Technetium 26m DTPA - ventilation; Technetium 21m perfusion - perfusion  Dose:  40.0 mCi - ventilation; 6.0 mCi - perfusion  Route of administration:  Inhalation - ventilation; intravenous - perfusion  Comparison:  Chest radiograph December 30, 2012  Findings: The ventilation study shows homogeneous and symmetric uptake of radiotracer bilaterally.  The perfusion study shows  homogeneous and symmetric uptake of radiotracer bilaterally.  There is no appreciable ventilation / perfusion mismatch.  IMPRESSION: No ventilation or perfusion defects.  Very low probability of pulmonary embolus.   Original Report Authenticated By: Bretta Bang, M.D.    Assessment/Plan  37 yo female with h/o PP cardiomyopathy, doe, chest pain Principal Problem:   Dyspnea Active Problems:   HBP (high blood pressure)   Leg swelling   OSA on CPAP   Postpartum cardiomyopathy   Chest pain  obs on tele.  Romi.  Asa.  Ck echo in am.  vss all stable no hypoxia.  ekg nsr.  Cardiology has also been called for consultation.  Give one small dose of lasix.  Full code.  Javien Tesch A 01/02/2013, 2:24 AM

## 2013-01-02 NOTE — ED Notes (Signed)
Report called to the rn on the floor 

## 2013-01-03 DIAGNOSIS — I519 Heart disease, unspecified: Secondary | ICD-10-CM

## 2013-01-03 MED ORDER — MAGNESIUM CITRATE PO SOLN
1.0000 | Freq: Once | ORAL | Status: AC
  Administered 2013-01-03: 1 via ORAL
  Filled 2013-01-03: qty 296

## 2013-01-03 MED ORDER — DIAZEPAM 5 MG PO TABS
10.0000 mg | ORAL_TABLET | Freq: Every evening | ORAL | Status: DC | PRN
  Administered 2013-01-03: 10 mg via ORAL
  Filled 2013-01-03: qty 2

## 2013-01-03 NOTE — Progress Notes (Signed)
Quick Note:  Spoke with pt and notified of results per Dr. Wert. Pt verbalized understanding and denied any questions.  ______ 

## 2013-01-03 NOTE — Progress Notes (Signed)
[facility] PROGRESS NOTE  [name] YNW:[id number] DOB: 1976-01-09 DOA: 01/01/2013 PCP: [name], MD  Assessment/Plan: Dyspnea: - Normal BNP - Awaiting 2D echo - CXR unremarkable - VQ very low prob for PE HTN: - stable and controlled - Cont current regimen OSA: - Cont CPAP as tolerated Postpartum Cariomyopathy: - 2D echo pending - Presenting BNP was normal Chest Pain: - Thus far, serial cardiac biomarkers neg - On further questioning, "chest pain" appears to be at the L lower ribs anteriorly. Also complains of nausea with food and early satiety with increased flatus. KUB difficult to read given body habitus, but per my read, stool noted in ascending colon - difficult to tell elsewhere. - PRN miralax ordered  Code Status: Full Family Communication: Pt in room (indicate person spoken with, relationship, and if by phone, the number) Disposition Plan: Pending   Consultants:  Cardiology  HPI/Subjective: No major complaints. Feels somewhat better today  Objective: Filed Vitals:   01/02/13 0220 01/02/13 0431 01/02/13 2135 01/03/13 0604  BP: 118/68 121/76 114/80 105/72  Pulse: 83 86 82 89  Temp: 97.9 F (36.6 C) 97.6 F (36.4 C) 97.9 F (36.6 C) 98.4 F (36.9 C)  TempSrc: Oral Oral Oral Oral  Resp: 18 18 18 18   Height:  5\' 5"  (1.651 m)    Weight:  113.49 kg (250 lb 3.2 oz)  112.22 kg (247 lb 6.4 oz)  SpO2: 99% 99% 97% 98%   No intake or output data in the 24 hours ending 01/03/13 1048 Filed Weights   01/02/13 0431 01/03/13 0604  Weight: 113.49 kg (250 lb 3.2 oz) 112.22 kg (247 lb 6.4 oz)    Exam:   General:  Awake, in nad  Cardiovascular: regular, s1, s2  Respiratory: normal resp effort, no wheezing  Abdomen: obese, pos bowel sounds, nondistended  Musculoskeletal: perfused, no clubbing   Data Reviewed: Basic Metabolic Panel:  Recent Labs Lab 12/28/12 1128 01/01/13 2310  NA 139 135  K 3.7 3.5  CL 106 96  CO2 28 28  GLUCOSE 102*  105*  BUN 7 17  CREATININE 0.8 1.03  CALCIUM 9.2 9.6   Liver Function Tests: No results found for this basename: AST, ALT, ALKPHOS, BILITOT, PROT, ALBUMIN,  in the last 168 hours No results found for this basename: LIPASE, AMYLASE,  in the last 168 hours No results found for this basename: AMMONIA,  in the last 168 hours CBC:  Recent Labs Lab 12/28/12 1128 01/01/13 2310  WBC 5.8 9.1  NEUTROABS 3.1  --   HGB 12.3 13.5  HCT 37.4 39.8  MCV 85.9 86.3  PLT 238.0 288   Cardiac Enzymes:  Recent Labs Lab 01/02/13 0720  TROPONINI <0.30   BNP (last 3 results)  Recent Labs  05/26/12 1513 12/28/12 1128 01/01/13 2310  PROBNP 55.5 53.0 16.9   CBG: No results found for this basename: GLUCAP,  in the last 168 hours  No results found for this or any previous visit (from the past 240 hour(s)).   Studies: Dg Abd 1 View  01/02/2013   *RADIOLOGY REPORT*  Clinical Data: Nausea since yesterday.  ABDOMEN - 1 VIEW  Comparison: Abdominal CT 01/11/2007.  Findings: Portions of the upper abdomen are excluded.  The visualized bowel gas pattern is normal.  There is no supine evidence of free intraperitoneal air. There is a small radiodensity overlapping the right renal shadow and right eleventh rib which could reflect a small renal calculus.  Cholecystectomy clips are noted.  IMPRESSION:  No acute abdominal findings.  Possible small right renal calculus.   Original Report Authenticated By: [name], M.D.    Scheduled Meds: . amLODipine  5 mg Oral Daily  . carvedilol  25 mg Oral Daily  . fluticasone  2 spray Each Nare Daily  . hydrochlorothiazide  50 mg Oral Daily  . sodium chloride  3 mL Intravenous Q12H   Continuous Infusions:   Principal Problem:   Dyspnea Active Problems:   HBP (high blood pressure)   Leg swelling   OSA on CPAP   Postpartum cardiomyopathy   Chest pain    Time spent:    [name]  [facility] Pager [phone]. If 7PM-7AM, please  contact night-coverage at [email or web address], password [facility] 01/03/2013, 10:48 AM  LOS: 2 days

## 2013-01-03 NOTE — Progress Notes (Signed)
  Echocardiogram 2D Echocardiogram has been performed.  [name] 01/03/2013, 10:02 AM 

## 2013-01-04 DIAGNOSIS — R079 Chest pain, unspecified: Secondary | ICD-10-CM

## 2013-01-04 DIAGNOSIS — R0609 Other forms of dyspnea: Secondary | ICD-10-CM

## 2013-01-04 DIAGNOSIS — O903 Peripartum cardiomyopathy: Secondary | ICD-10-CM

## 2013-01-04 DIAGNOSIS — I10 Essential (primary) hypertension: Secondary | ICD-10-CM

## 2013-01-04 DIAGNOSIS — M7989 Other specified soft tissue disorders: Secondary | ICD-10-CM

## 2013-01-04 DIAGNOSIS — G4733 Obstructive sleep apnea (adult) (pediatric): Secondary | ICD-10-CM

## 2013-01-04 DIAGNOSIS — R0989 Other specified symptoms and signs involving the circulatory and respiratory systems: Secondary | ICD-10-CM

## 2013-01-04 MED ORDER — FUROSEMIDE 40 MG PO TABS: 40.0000 mg | ORAL_TABLET | Freq: Every day | ORAL | Status: DC

## 2013-01-04 NOTE — Care Management Note (Addendum)
  Page 1 of 1   01/04/2013     1:27:52 PM   CARE MANAGEMENT NOTE 01/04/2013  Patient:  [name], [name]   Account Number:  [id number]  Date Initiated:  01/04/2013  Documentation initiated by:  GRAVES-BIGELOW,Chetara Kropp  Subjective/Objective Assessment:   Pt admitted for SOB. Pt has a cpap at home. CM received a referral for: Please have [facility] provide an auto-titration CPAP to the patient with the data to go to Dr Sherene Sires after 3 weeks of use. Range pressure 5-20cmH2O     Action/Plan:   CM did call [facility] to see if pt qualified for new cpap and if settings could be changed. CM will f/u.   Anticipated DC Date:  01/04/2013   Anticipated DC Plan:  HOME/SELF CARE      DC Planning Services  CM consult      Choice offered to / List presented to:             Status of service:  Completed, signed off Medicare Important Message given?   (If response is "NO", the following Medicare IM given date fields will be blank) Date Medicare IM given:   Date Additional Medicare IM given:    Discharge Disposition:  HOME/SELF CARE  Per UR Regulation:  Reviewed for med. necessity/level of care/duration of stay  If discussed at Long Length of Stay Meetings, dates discussed:    Comments:    CM did call [facility] and an order was placed in Epic for Cpap and auto settings to be sent to [facility]. No further needs from CM at this time.

## 2013-01-04 NOTE — Progress Notes (Signed)
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 Patient stated that she had positive culture several years ago while an inpatient at [facility]. Spoke with Infection Prevention department at [facility]. No positive culture for VRE or any other MDRO identified. Notified staff on floor and will discontinue precautions at this time.

## 2013-01-04 NOTE — Discharge Summary (Addendum)
Physician Discharge Summary  [name] ZOX:[id number] DOB: 1975/07/27 DOA: 01/01/2013  PCP: [name], MD  Admit date: 01/01/2013 Discharge date: 01/04/2013  Time spent: 30 minutes  Recommendations for Outpatient Follow-up:  1. Follow up with PCP in 1-2 weeks 2. Repeat basic metabolic profile in 1 week 3. Consider resuming Cozaar if blood pressure allows. This was held secondary to a soft blood pressure during hospitalization 4. Consider outpatient Cardiology follow up 5. 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 She has seen her cardiologist and pulmonologist as outpt. She has had a vq scan showing low prob, along with u/s le neg for dvt. She is scheduled for pfts and repeat sleep study. She has not been having any fevers. N/v. No diarrhea. Some occasional le swelling and hand swelling, only on hctz never been on any stronger diuretics. No cough. Cp free now. Last known EF unknown no echo done in 2 years.   Hospital Course:  The patient was admitted to the floor. A a cxr was found to be unremarkable. On further questioning, the patient describes symptoms suggestive of constipation. She was started on a bowel regimen with good results. However, the patient continued to complain of shortness of breath, worse with exertion. Pulmonary was consulted. The patient was recommended to continue with PFT's ordered, proceed with a pending sleep study, and  cont CPAP with an auto-titration device which is ordered for home with data to go to Dr. Sherene Sires. The patient is to follow up with Pulmonary as an outpatient.  Of note, the patient was noted to have and EF of 35-40% on 2D echo. BNP was normal. The patient will be discharged home with lasix instead of hctz. She should resume Cozaar once her blood pressure allows it.This was held for a soft blood pressure during hospitalization.  Procedures:  2D echo EF 35-40%  Consultations:  Pulmonary  Discharge Exam: Filed Vitals:   01/03/13 1404 01/03/13 2109 01/04/13 0555 01/04/13 1042  BP: 104/70 121/83 136/83 131/92  Pulse: 82 88 87   Temp: 98.2 F (36.8 C) 98.7 F (37.1 C) 97.9 F (36.6 C)   TempSrc:  Oral Oral   Resp: 18 18 18    Height:      Weight:      SpO2: 96% 99% 99%     General: Awake, in nad Cardiovascular: regular, s1, s2 Respiratory: normal resp effort, no wheezing  Discharge Instructions       Future Appointments Provider Department Dept Phone   01/13/2013 12:00 PM Nyoka Cowden, MD [facility] [phone]   01/20/2013 2:45 PM Etta Grandchild, MD [facility] [phone]   02/01/2013 2:30 PM Barbaraann Share, MD [facility] [phone]   02/08/2013 1:00 PM [facility] [phone]   02/08/2013 2:00 PM Nyoka Cowden, MD [facility] [phone]       Medication List  STOP taking these medications       hydrochlorothiazide 50 MG tablet  Commonly known as:  HYDRODIURIL     losartan 100 MG tablet  Commonly known as:  COZAAR      TAKE these medications       acetaminophen 500 MG tablet  Commonly known as:  TYLENOL  Take 1,000 mg by mouth every 6 (six) hours as needed for pain.     amLODipine 5 MG tablet  Commonly known as:  NORVASC  Take 5 mg by mouth daily.     carvedilol 25 MG tablet  Commonly known as:  COREG  Take 25 mg by mouth daily.     cholecalciferol 1000  UNITS tablet  Commonly known as:  VITAMIN D  Take 1,000 Units by mouth daily.     colesevelam 625 MG tablet  Commonly known as:  WELCHOL  Take 625 mg by mouth daily.     diazepam 10 MG tablet  Commonly known as:  VALIUM  Take 10 mg by mouth daily.     dicyclomine 10 MG capsule  Commonly known as:  BENTYL  Take 10 mg by mouth 3 (three) times daily as needed (for abdominal cramping).     furosemide 40 MG tablet  Commonly known as:  LASIX  Take 1 tablet (40 mg total) by mouth daily.  Start taking on:  01/05/2013     ondansetron 4 MG disintegrating tablet  Commonly known as:  ZOFRAN-ODT  Take 4 mg by mouth every 8 (eight) hours as needed.     promethazine 25 MG tablet  Commonly known as:  PHENERGAN  Take 25 mg by mouth every 6 (six) hours as needed for nausea.       Allergies  Allergen Reactions  . Aspirin Other (See Comments)    wheezing  . Ivp Dye [Iodinated Diagnostic Agents] Other (See Comments)    Did something to my liver and kidneys told not to use it  . Vancomycin Other (See Comments)    "did something to my kidneys"  . Ciprofloxacin Itching and Rash  . Sulfa Antibiotics Itching and Rash   Follow-up Information   Follow up with LITTLE,JAMES, MD. Schedule an appointment as soon as possible for a visit in 1 week.   Specialty:  Family Medicine   Contact information:   [address] [place] [address] [phone]       Follow up with Follow up with your lung doctor as scheduled.       The results of significant diagnostics from this hospitalization (including imaging, microbiology, ancillary and laboratory) are listed below for reference.    Significant Diagnostic Studies: Dg Chest 2 View  12/30/2012   *RADIOLOGY REPORT*  Clinical Data: Shortness of breath, elevated D-dimer  CHEST - 2 VIEW  Comparison: 12/28/2012  Findings: Cardiomediastinal silhouette is stable.  No acute infiltrate or pleural effusion.  No pulmonary edema.  Bony thorax is unremarkable.   IMPRESSION: No active disease.  No significant change.   Original Report Authenticated By: Natasha Mead, M.D.   Dg Chest 2 View  12/28/2012   *RADIOLOGY REPORT*  Clinical Data:  Shortness of breath, hypertension  CHEST - 2 VIEW  Comparison: 05/26/2012  Findings: Enlargement of cardiac silhouette. Mediastinal contours and pulmonary vascularity normal. Lungs clear. No pleural effusion or pneumothorax. Bones unremarkable. Surgical clips right upper quadrant question cholecystectomy.  IMPRESSION: Mild enlargement of cardiac silhouette. No acute abnormalities.   Original Report Authenticated By: Ulyses Southward, M.D.  Dg Abd 1 View  01/02/2013   *RADIOLOGY REPORT*  Clinical Data: Nausea since yesterday.  ABDOMEN - 1 VIEW  Comparison: Abdominal CT 01/11/2007.  Findings: Portions of the upper abdomen are excluded.  The visualized bowel gas pattern is normal.  There is no supine evidence of free intraperitoneal air. There is a small radiodensity overlapping the right renal shadow and right eleventh rib which could reflect a small renal calculus.  Cholecystectomy clips are noted.  IMPRESSION: No acute abdominal findings.  Possible small right renal calculus.   Original Report Authenticated By: Carey Bullocks, M.D.   Nm Pulmonary Per & Vent  12/30/2012   *RADIOLOGY REPORT*  Clinical Data: Shortness of breath  NM PULMONARY VENTILATION AND PERFUSION SCAN  Views:  Anterior, posterior, left lateral, right lateral, RPO, LPO, RAO, LAO - ventilation and perfusion  Radiopharmaceutical: Technetium 3m DTPA - ventilation; Technetium 58m perfusion - perfusion  Dose:  40.0 mCi - ventilation; 6.0 mCi - perfusion  Route of administration:  Inhalation - ventilation; intravenous - perfusion  Comparison:  Chest radiograph December 30, 2012  Findings: The ventilation study shows homogeneous and symmetric uptake of radiotracer bilaterally.  The perfusion study shows homogeneous and symmetric uptake of radiotracer bilaterally.  There is no  appreciable ventilation / perfusion mismatch.  IMPRESSION: No ventilation or perfusion defects.  Very low probability of pulmonary embolus.   Original Report Authenticated By: Bretta Bang, M.D.    Microbiology: No results found for this or any previous visit (from the past 240 hour(s)).   Labs: Basic Metabolic Panel:  Recent Labs Lab 01/01/13 2310  NA 135  K 3.5  CL 96  CO2 28  GLUCOSE 105*  BUN 17  CREATININE 1.03  CALCIUM 9.6   Liver Function Tests: No results found for this basename: AST, ALT, ALKPHOS, BILITOT, PROT, ALBUMIN,  in the last 168 hours No results found for this basename: LIPASE, AMYLASE,  in the last 168 hours No results found for this basename: AMMONIA,  in the last 168 hours CBC:  Recent Labs Lab 01/01/13 2310  WBC 9.1  HGB 13.5  HCT 39.8  MCV 86.3  PLT 288   Cardiac Enzymes:  Recent Labs Lab 01/02/13 0720  TROPONINI <0.30   BNP: BNP (last 3 results)  Recent Labs  05/26/12 1513 12/28/12 1128 01/01/13 2310  PROBNP 55.5 53.0 16.9   CBG: No results found for this basename: GLUCAP,  in the last 168 hours     Signed:  Jostin Rue K  [facility] 01/04/2013, 1:07 PM

## 2013-01-04 NOTE — Consult Note (Signed)
PULMONARY  / CRITICAL CARE MEDICINE  Name: MEGA KINKADE MRN: [id number] DOB: 24-Nov-1975    ADMISSION DATE:  01/01/2013 CONSULTATION DATE:  01/04/13   REFERRING MD :  Red Christians ([facility])  PRIMARY SERVICE:  [facility]   CHIEF COMPLAINT:  Dyspnea   BRIEF PATIENT DESCRIPTION: 37 yo female with hx cardiomyopathy, OSA (compliant with CPAP), HTN recently seen by Dr. Sherene Sires in office 8/6 as new consult for SOB.  W/u essentially reassuring (6 min walk ok, BLE dopplers neg, VQ scan low prob PE, BNP ok) and SOB though likely r/t obesity, deconditioning, decompensated OSA.  Pt presented 8/11 to ER with c/o worsening SOB and admitted to hospitalist who request pulmonary consult.    SIGNIFICANT EVENTS / STUDIES:  2D echo 8/12>>> EF 35-40%, mod reduced systolic function, diffuse hypokinesis, grade 2 diastolic dysfunction, no valvular abnormalities   LINES / TUBES: None   CULTURES: none  ANTIBIOTICS: none  HISTORY OF PRESENT ILLNESS:  37 yo female never smoker with hx cardiomyopathy, OSA (compliant with CPAP), HTN recently seen by Dr. Sherene Sires in office 8/6 as new consult for SOB.  W/u reassuring (6 min walk ok, BLE dopplers neg, VQ scan low prob PE, BNP ok) and SOB though likely r/t obesity, deconditioning, decompensated OSA.  Pt presented 8/11 to ER with c/o more SOB and admitted to hospitalist who request pulmonary consult. C/o SOB mostly with exertion, but worse now.  Previously SOB with stairs, grocery store, etc. Now c/o SOB just getting to bathroom.  Wears CPAP nightly but settings are the same from her sleep study >5 years ago.  Never smoker.    PAST MEDICAL HISTORY :  Past Medical History  Diagnosis Date  . Cardiomyopathy   . Hypertension   . Ovarian cyst   . OSA (obstructive sleep apnea)     on CPAP   . IBS (irritable bowel syndrome)    Past Surgical History  Procedure Laterality Date  . Abdominal surgery    . Cesarean section    . Cholecystectomy    . Foot surgery     Prior to Admission  medications   Medication Sig Start Date End Date Taking? Authorizing Provider  acetaminophen (TYLENOL) 500 MG tablet Take 1,000 mg by mouth every 6 (six) hours as needed for pain.   Yes Historical Provider, MD  amLODipine (NORVASC) 5 MG tablet Take 5 mg by mouth daily.   Yes Historical Provider, MD  carvedilol (COREG) 25 MG tablet Take 25 mg by mouth daily.   Yes Historical Provider, MD  cholecalciferol (VITAMIN D) 1000 UNITS tablet Take 1,000 Units by mouth daily.   Yes Historical Provider, MD  colesevelam (WELCHOL) 625 MG tablet Take 625 mg by mouth daily.    Yes Historical Provider, MD  diazepam (VALIUM) 10 MG tablet Take 10 mg by mouth daily.    Yes Historical Provider, MD  dicyclomine (BENTYL) 10 MG capsule Take 10 mg by mouth 3 (three) times daily as needed (for abdominal cramping).    Yes Historical Provider, MD  hydrochlorothiazide (HYDRODIURIL) 50 MG tablet Take 50 mg by mouth daily.   Yes Historical Provider, MD  losartan (COZAAR) 100 MG tablet Take 100 mg by mouth 2 (two) times daily.    Yes Historical Provider, MD  ondansetron (ZOFRAN-ODT) 4 MG disintegrating tablet Take 4 mg by mouth every 8 (eight) hours as needed.   Yes Historical Provider, MD  promethazine (PHENERGAN) 25 MG tablet Take 25 mg by mouth every 6 (six) hours as needed for  nausea.   Yes Historical Provider, MD   Allergies  Allergen Reactions  . Aspirin Other (See Comments)    wheezing  . Ivp Dye [Iodinated Diagnostic Agents] Other (See Comments)    Did something to my liver and kidneys told not to use it  . Vancomycin Other (See Comments)    "did something to my kidneys"  . Ciprofloxacin Itching and Rash  . Sulfa Antibiotics Itching and Rash    FAMILY HISTORY:  Family History  Problem Relation Age of Onset  . Emphysema Maternal Grandmother     smoked  . Allergies Daughter   . Heart disease Maternal Grandmother   . Rheum arthritis Mother    SOCIAL HISTORY:  reports that she has never smoked. She has never  used smokeless tobacco. She reports that she does not drink alcohol or use illicit drugs.  REVIEW OF SYSTEMS:   As per HPI - all other systems reviewed and were neg.    VITAL SIGNS: Temp:  [97.9 F (36.6 C)-98.7 F (37.1 C)] 97.9 F (36.6 C) (08/13 0555) Pulse Rate:  [82-88] 87 (08/13 0555) Resp:  [18] 18 (08/13 0555) BP: (104-136)/(70-83) 136/83 mmHg (08/13 0555) SpO2:  [96 %-99 %] 99 % (08/13 0555)  PHYSICAL EXAMINATION: General:  Pleasant female, NAD sitting OOB in chair  Neuro:  Awake, alert, appropriate, MAE HEENT:  Mm moist, no JVD Cardiovascular:  s1s2 rrr, no m/r/g Lungs:  resps even non labored at rest, slightly diminished bases, otherwise cta  Abdomen:  Obese, soft, non tender, =bs Musculoskeletal:  Warm and dry, no edema    Recent Labs Lab 12/28/12 1128 01/01/13 2310  NA 139 135  K 3.7 3.5  CL 106 96  CO2 28 28  BUN 7 17  CREATININE 0.8 1.03  GLUCOSE 102* 105*    Recent Labs Lab 12/28/12 1128 01/01/13 2310  HGB 12.3 13.5  HCT 37.4 39.8  WBC 5.8 9.1  PLT 238.0 288   Dg Abd 1 View  01/02/2013   *RADIOLOGY REPORT*  Clinical Data: Nausea since yesterday.  ABDOMEN - 1 VIEW  Comparison: Abdominal CT 01/11/2007.  Findings: Portions of the upper abdomen are excluded.  The visualized bowel gas pattern is normal.  There is no supine evidence of free intraperitoneal air. There is a small radiodensity overlapping the right renal shadow and right eleventh rib which could reflect a small renal calculus.  Cholecystectomy clips are noted.  IMPRESSION: No acute abdominal findings.  Possible small right renal calculus.   Original Report Authenticated By: Carey Bullocks, M.D.    ASSESSMENT / PLAN:  Dyspnea - likely multifactorial in setting obesity, OSA, cardiomyopathy with EF 35%, diastolic dysfunction (although does not appear volume overloaded).  BLE dopplers, VQ scan neg.  CXR clear (8/8).  On room air.  Chest "tightness" appears more GI, LUQ and associated with some  nausea and increased flatus.  RECS --  PFT's pending  Formal sleep study pending  Cont qhs CPAP with an auto-titration device (suspect her old settings need to be adjusted) >> wil order for for home with data to go to Dr Sherene Sires Tight BP control  Further GI eval, stool softeners per primary  Work on weight loss Consider CPST if we are unable to find a clear cause of progressive dyspnea Will cont to f/u as outpt    WHITEHEART,KATHRYN, NP 01/04/2013  10:08 AM Pager: [phone] or [phone]  *Care during the described time interval was provided by me and/or other providers on  the critical care team. I have reviewed this patient's available data, including medical history, events of note, physical examination and test results as part of my evaluation.  Levy Pupa, MD, PhD 01/04/2013, 11:44 AM  Pulmonary and Critical Care [phone] or if no answer [phone]

## 2013-01-05 ENCOUNTER — Telehealth: Payer: Self-pay | Admitting: Internal Medicine

## 2013-01-05 DIAGNOSIS — Z9989 Dependence on other enabling machines and devices: Secondary | ICD-10-CM

## 2013-01-05 NOTE — Telephone Encounter (Signed)
Order placed.  Pt aware and understands she will need to keep OV with [facility].  Staff msg sent to [facility].

## 2013-01-05 NOTE — Telephone Encounter (Signed)
Yes, contingent on her keeping appt to see Clance and then he can decide what specifically she needs longterm (actually this was the responsibility of the case manager at the hospital and may want to look at notes to identify who it was and why it hasn't already been done)

## 2013-01-05 NOTE — Telephone Encounter (Signed)
[email or web address]  

## 2013-01-05 NOTE — Telephone Encounter (Signed)
Spoke with pt - Was was in the hospital from 8/10 - 8/13.  Pt states she was rx'ed a new cpap machine while in the hospital.  She will need a new mask, bed pillow, and supplies for this particular machine.  She hasn't seen a sleep md in > 5 yrs.  She is scheduled to see [facility] as a sleep consult on Sept 10, 2014.  She would like to know if Dr. Sherene Sires will place order for this?  Pls advise.  Thank you.

## 2013-01-09 ENCOUNTER — Telehealth: Payer: Self-pay | Admitting: Internal Medicine

## 2013-01-09 NOTE — Telephone Encounter (Signed)
Pt was checking on this again. [name]

## 2013-01-09 NOTE — Telephone Encounter (Signed)
Order was sent. I have sent a message to [facility] to see where we are in this process. Will await response. Carron Curie, CMA

## 2013-01-09 NOTE — Telephone Encounter (Signed)
Returning call can be reached at [phone].[name]

## 2013-01-09 NOTE — Telephone Encounter (Signed)
LMTCB X1 FOR PT see phone note 01/05/13 regarding CPAP supplies

## 2013-01-10 NOTE — Telephone Encounter (Signed)
I spoke with [name] and she stated she will call back she has to look into this

## 2013-01-10 NOTE — Telephone Encounter (Signed)
Pt had an appt at [address] [facility] to get her cpap supplies. Nothing further needed. Carron Curie, CMA

## 2013-01-13 ENCOUNTER — Telehealth: Payer: Self-pay | Admitting: Internal Medicine

## 2013-01-13 ENCOUNTER — Encounter: Payer: Self-pay | Admitting: Internal Medicine

## 2013-01-13 ENCOUNTER — Ambulatory Visit (INDEPENDENT_AMBULATORY_CARE_PROVIDER_SITE_OTHER): Payer: 59 | Admitting: Internal Medicine

## 2013-01-13 VITALS — BP 128/86 | HR 91 | Temp 98.4°F | Ht 65.0 in | Wt 255.0 lb

## 2013-01-13 DIAGNOSIS — R0609 Other forms of dyspnea: Secondary | ICD-10-CM

## 2013-01-13 DIAGNOSIS — G4733 Obstructive sleep apnea (adult) (pediatric): Secondary | ICD-10-CM

## 2013-01-13 DIAGNOSIS — R0600 Dyspnea, unspecified: Secondary | ICD-10-CM

## 2013-01-13 NOTE — Progress Notes (Signed)
Subjective:    Patient ID: [name], female    DOB: 1975/11/04   MRN: [id number]    Brief patient profile:  51 yobf never smoker with progressively worse  morbid obesity complicated by post partum cardiomyopathy 1999 followed by Dr [name] referred 12/28/2012 to pulmonary clinic [name] for doe.   HPI 12/28/2012 1st pulmonary eval/ [name] cc indolent onset progressive doe x 15 years gradually worse to point where has to stop at top one flight of steps or length of parking lot, assoc with noct dry cough esp if too flat rec No change rx W/u for PE > neg  Admit date: 01/01/2013  Discharge date: 01/04/2013  Time spent: 30 minutes  Recommendations for Outpatient Follow-up:  1. Follow up with PCP in 1-2 weeks 2. Repeat basic metabolic profile in 1 week 3. Consider resuming Cozaar if blood pressure allows. This was held secondary to a soft blood pressure during hospitalization 4. Consider outpatient Cardiology follow up 5. 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 She has seen her cardiologist and pulmonologist as outpt. She has had a vq scan showing low prob, along with u/s le neg for dvt. She is scheduled for pfts and repeat sleep study. She has not been having any fevers. N/v. No diarrhea. Some occasional le swelling and hand swelling, only on hctz never been on any stronger diuretics. No cough. Cp free now. Last known EF unknown no echo done in 2  years.  Hospital Course:  The patient was admitted to the floor. A a cxr was found to be unremarkable. On further questioning, the patient describes symptoms suggestive of constipation. She was started on a bowel regimen with good results. However, the patient continued to complain of shortness of breath, worse with exertion. Pulmonary was consulted. The patient was recommended to continue with PFT's ordered, proceed with a pending sleep study, and cont CPAP with an auto-titration device which is ordered for home with data to go to [name]. The patient is to follow up with Pulmonary as an outpatient.  Of note, the patient was noted to have and EF of 35-40% on 2D echo. BNP was normal. The patient will be discharged home with lasix instead of hctz. She should resume Cozaar once her blood pressure allows it.This was held for a soft blood pressure during hospitalization.       01/13/2013  Post hosp f/u ov/[name]  Chief Complaint  Patient presents with  . Shortness of Breath    Breathing is slightly improved. Reports still having chest pressure.   14 steps  x 6 years sob  but worse last  6 months  No obvious daytime variabilty or assoc cough or cp or chest tightness, subjective wheeze overt sinus or hb symptoms. No unusual exp hx or h/o childhood pna/ asthma or knowledge of premature birth.   Sleeping ok s cpap without nocturnal  or early am exacerbation  of respiratory  c/o's or need for noct saba or daytime hypersomnolence. Also denies any obvious fluctuation of symptoms with weather or environmental changes or other aggravating or alleviating factors except as outlined above    Current Medications, Allergies, Past Medical History, Past Surgical History, Family History, and Social History were reviewed in [employer] record.  ROS  The following are not active complaints unless bolded sore throat, dysphagia, dental problems, itching, sneezing,  nasal congestion or excess/  purulent secretions, ear ache,   fever, chills, sweats, unintended wt loss, pleuritic or exertional cp, hemoptysis,  orthopnea pnd or leg swelling, presyncope, palpitations, heartburn, abdominal pain, anorexia, nausea, vomiting, diarrhea  or change in bowel or urinary habits, change in stools or urine, dysuria,hematuria,  rash, arthralgias, visual complaints, headache, numbness weakness or ataxia or problems with walking or coordination,  change in mood/affect or memory.            Objective:   Physical Exam  Baseline wt 165-170 before post partum around 1999   Wt Readings from Last 3 Encounters:  01/13/13 255 lb (115.667 kg)  01/03/13 247 lb 6.4 oz (112.22 kg)  12/28/12 255 lb (115.667 kg)       HEENT: nl dentition, turbinates, and orophanx. Nl external ear canals without cough reflex   NECK :  without JVD/Nodes/TM/ nl carotid upstrokes bilaterally   LUNGS: no acc muscle use, clear to A and P bilaterally without cough on insp or exp maneuvers   CV:  RRR  no s3 or murmur or increase in P2, no edema   ABD:  soft and nontender with nl excursion in the supine position. No bruits or organomegaly, bowel sounds nl  MS:  warm without deformities, calf tenderness, cyanosis or clubbing  SKIN: warm and dry without lesions          cxr 12/28/12 Mild enlargement of cardiac silhouette.  No acute abnormalities.       Assessment & Plan:

## 2013-01-13 NOTE — Patient Instructions (Addendum)
Pace yourself with exercise where you are never out of breath  Keep previous appointments

## 2013-01-13 NOTE — Telephone Encounter (Signed)
LMTCBx1. Pt needs to get these meds through her PCP, MW never prescribed these. Carron Curie, CMA

## 2013-01-15 NOTE — Assessment & Plan Note (Signed)
Explained the cpap doesn't "unload the burden of the diaphragm" during sleep but this requires bipap (which she really doesn't qualify for yet) or wt loss, which she needs to start now.

## 2013-01-16 NOTE — Telephone Encounter (Signed)
Pt advised. Jennifer Castillo, CMA  

## 2013-01-20 ENCOUNTER — Other Ambulatory Visit (INDEPENDENT_AMBULATORY_CARE_PROVIDER_SITE_OTHER): Payer: 59

## 2013-01-20 ENCOUNTER — Ambulatory Visit (INDEPENDENT_AMBULATORY_CARE_PROVIDER_SITE_OTHER): Payer: 59 | Admitting: Internal Medicine

## 2013-01-20 ENCOUNTER — Ambulatory Visit: Payer: 59 | Admitting: Internal Medicine

## 2013-01-20 ENCOUNTER — Encounter: Payer: Self-pay | Admitting: Internal Medicine

## 2013-01-20 VITALS — BP 132/92 | HR 90 | Temp 98.1°F | Ht 65.0 in | Wt 254.0 lb

## 2013-01-20 DIAGNOSIS — F418 Other specified anxiety disorders: Secondary | ICD-10-CM

## 2013-01-20 DIAGNOSIS — O903 Peripartum cardiomyopathy: Secondary | ICD-10-CM

## 2013-01-20 DIAGNOSIS — I10 Essential (primary) hypertension: Secondary | ICD-10-CM

## 2013-01-20 DIAGNOSIS — G4733 Obstructive sleep apnea (adult) (pediatric): Secondary | ICD-10-CM

## 2013-01-20 DIAGNOSIS — F341 Dysthymic disorder: Secondary | ICD-10-CM

## 2013-01-20 DIAGNOSIS — F329 Major depressive disorder, single episode, unspecified: Secondary | ICD-10-CM | POA: Insufficient documentation

## 2013-01-20 HISTORY — DX: Other specified anxiety disorders: F41.8

## 2013-01-20 LAB — BASIC METABOLIC PANEL
BUN: 8 mg/dL (ref 6–23)
CO2: 28 mEq/L (ref 19–32)
Calcium: 9.9 mg/dL (ref 8.4–10.5)
Chloride: 106 mEq/L (ref 96–112)
Creatinine, Ser: 0.8 mg/dL (ref 0.4–1.2)
GFR: 104.3 mL/min (ref >60.00)
Glucose, Bld: 89 mg/dL (ref 70–99)
Potassium: 4 mEq/L (ref 3.5–5.1)
Sodium: 138 mEq/L (ref 135–145)

## 2013-01-20 NOTE — Patient Instructions (Signed)
It was good to see you today. We have reviewed your prior records including labs and tests today Test(s) ordered today. Your results will be released to MyChart (or called to you) after review, usually within 72hours after test completion. If any changes need to be made, you will be notified at that same time. Medications reviewed and updated, no changes recommended at this time. we'll make referral to new cardiology specialist. Our office will contact you regarding appointment(s) once made. Continue working with your other specialists as reviewed Please schedule followup in 6 weeks to continue review, call sooner if problems.

## 2013-01-20 NOTE — Progress Notes (Signed)
Subjective:    Patient ID: [name], female    DOB: 1975/07/07, 37 y.o.   MRN: [id number]  HPI  New patient to me, here to establish with PCP Known to other [facility] specialists - pulm and cards Reviewed chronic medical issues:  CM hx - initially dx postpartum but did not resolve. symptomatic dyspnea on exertion. Recent hospitalization for same summer 2014, now on daily diuretics - no edema or chest pain - needs refer to establish with local cards  hypertension -resistant to tx - ARB temp on hold after hospitalization due to ARI, then resumed at lower dose - reports compliance with meds as rx'd  Anxiety - exacerabated by medical illnesses - previously ineffective symptoms control with various SSRI, SNRI and BZs - upcoming appt for consultation with psyc Dr Evelene Croon whom has worked with in past  OSA - compliant with CPAP and upcoming OV with sleep specialist.  Past Medical History  Diagnosis Date  . Cardiomyopathy   . Hypertension   . Ovarian cyst   . OSA (obstructive sleep apnea)     on CPAP   . IBS (irritable bowel syndrome)   . Postpartum cardiomyopathy   . Depression   . Anxiety    Family History  Problem Relation Age of Onset  . Emphysema Maternal Grandmother     smoked  . Allergies Daughter   . Heart disease Maternal Grandmother 45    MI  . Rheum arthritis Mother    History  Substance Use Topics  . Smoking status: Never Smoker   . Smokeless tobacco: Never Used  . Alcohol Use: No     Review of Systems Constitutional: Negative for fever or weight change.  Respiratory: Negative for cough or change in chronic dyspnea on exertion, no shortness of breath.   Cardiovascular: Negative for chest pain or palpitations.  Gastrointestinal: Negative for abdominal pain, no bowel changes.  Musculoskeletal: Negative for gait problem or joint swelling.  Skin: Negative for rash.  Neurological: Negative for dizziness or headache.  No other specific complaints in a complete review  of systems (except as listed in HPI above).     Objective:   Physical Exam BP 132/92  Pulse 90  Temp(Src) 98.1 F (36.7 C) (Oral)  Ht 5\' 5"  (1.651 m)  Wt 254 lb (115.214 kg)  BMI 42.27 kg/m2  SpO2 96%  LMP 12/21/2012 Wt Readings from Last 3 Encounters:  01/20/13 254 lb (115.214 kg)  01/13/13 255 lb (115.667 kg)  01/03/13 247 lb 6.4 oz (112.22 kg)   Constitutional: She is overweight, but appears well-developed and well-nourished. No distress.  HENT: Head: Normocephalic and atraumatic. Ears: B TMs ok, no erythema or effusion; Nose: Nose normal. Mouth/Throat: Oropharynx is clear and moist. No oropharyngeal exudate.  Eyes: Conjunctivae and EOM are normal. Pupils are equal, round, and reactive to light. No scleral icterus.  Neck: Thick. Normal range of motion. Neck supple. No JVD present. No thyromegaly present.  Cardiovascular: Normal rate, regular rhythm and normal heart sounds.  No murmur heard. No BLE edema. Pulmonary/Chest: Effort normal and breath sounds normal. No respiratory distress. She has no wheezes.  Abdominal: Soft. Bowel sounds are normal. She exhibits no distension. There is no tenderness. no masses Musculoskeletal: Normal range of motion, no joint effusions. No gross deformities Neurological: She is alert and oriented to person, place, and time. No cranial nerve deficit. Coordination, balance, strength, speech and gait are normal.  Skin: Skin is warm and dry. No rash noted. No erythema.  Psychiatric: She has a mildly anxious/ dysphoric mood and affect. Her behavior is normal. Judgment and thought content normal.   Lab Results  Component Value Date   WBC 9.1 01/01/2013   HGB 13.5 01/01/2013   HCT 39.8 01/01/2013   PLT 288 01/01/2013   GLUCOSE 105* 01/01/2013   ALT 33 01/13/2007   AST 30 01/13/2007   NA 135 01/01/2013   K 3.5 01/01/2013   CL 96 01/01/2013   CREATININE 1.03 01/01/2013   BUN 17 01/01/2013   CO2 28 01/01/2013   TSH 1.80 12/28/2012        Assessment & Plan:    See problem list. Medications and labs reviewed today.  Time spent with pt today 45 minutes, greater than 50% time spent counseling patient on CM hx, hypertension, OSA, anxiety and medication review. Also review of prior records

## 2013-01-21 NOTE — Assessment & Plan Note (Signed)
Chronic symptoms, exac by medical illness at young age Prior trials zoloft, prozac, cymbalta, xanax and lyrica ineffective at symptoms control Takes valium qhs prn Upcoming specialist appt with Dr Evelene Croon for med mgmt of same 01/2013 Support offered, no med changes recommended

## 2013-01-21 NOTE — Assessment & Plan Note (Signed)
CPAP ongoing for same - upcoming eval by sleep specialist Clance Pt understands relationship to obesity but has been unable to successfully implement changes for sustained weight reduction

## 2013-01-21 NOTE — Assessment & Plan Note (Signed)
hosp for exac of same 12/2012 - now on daily lasix No edema or change in chronic dyspnea on exertion Check bmet now Refer to new cards for establishment of specialist care

## 2013-01-21 NOTE — Assessment & Plan Note (Signed)
BP Readings from Last 3 Encounters:  01/20/13 132/92  01/13/13 128/86  01/04/13 125/79   Resistant to med tx On ARB, amlodipine, coreg, and now lasix since 12/2012 hosp for CHF exac Check labs Refer cards The current medical regimen is effective;  continue present plan and medications.

## 2013-01-24 ENCOUNTER — Telehealth: Payer: Self-pay | Admitting: *Deleted

## 2013-01-24 DIAGNOSIS — I10 Essential (primary) hypertension: Secondary | ICD-10-CM

## 2013-01-24 DIAGNOSIS — O903 Peripartum cardiomyopathy: Secondary | ICD-10-CM

## 2013-01-24 MED ORDER — CVS BLOOD PRESSURE MONITOR KIT: PACK | Status: DC

## 2013-01-24 NOTE — Telephone Encounter (Signed)
Left msg on vm stating md rx a blood pressure cuff needing to get CPT code/...lmb

## 2013-01-24 NOTE — Telephone Encounter (Signed)
?   CPT - icd codes 401.9 and 428.0 -  I [facility] - [facility] to see if they now have info they need  Thanks!

## 2013-01-24 NOTE — Telephone Encounter (Signed)
Called pt no answer LMOM with md response.../lmb 

## 2013-01-25 ENCOUNTER — Encounter: Payer: Self-pay | Admitting: Cardiology

## 2013-01-25 ENCOUNTER — Other Ambulatory Visit: Payer: Self-pay | Admitting: Internal Medicine

## 2013-01-25 ENCOUNTER — Ambulatory Visit (INDEPENDENT_AMBULATORY_CARE_PROVIDER_SITE_OTHER): Payer: 59 | Admitting: Cardiology

## 2013-01-25 VITALS — BP 131/90 | HR 90 | Ht 65.0 in | Wt 251.4 lb

## 2013-01-25 DIAGNOSIS — R0600 Dyspnea, unspecified: Secondary | ICD-10-CM

## 2013-01-25 DIAGNOSIS — O903 Peripartum cardiomyopathy: Secondary | ICD-10-CM

## 2013-01-25 DIAGNOSIS — I10 Essential (primary) hypertension: Secondary | ICD-10-CM

## 2013-01-25 DIAGNOSIS — R0602 Shortness of breath: Secondary | ICD-10-CM

## 2013-01-25 LAB — BASIC METABOLIC PANEL
BUN: 7 mg/dL (ref 6–23)
CO2: 26 mEq/L (ref 19–32)
Calcium: 9.2 mg/dL (ref 8.4–10.5)
Chloride: 106 mEq/L (ref 96–112)
Creatinine, Ser: 0.8 mg/dL (ref 0.4–1.2)
GFR: 105.84 mL/min (ref >60.00)
Glucose, Bld: 94 mg/dL (ref 70–99)
Potassium: 3.4 mEq/L — ABNORMAL LOW (ref 3.5–5.1)
Sodium: 137 mEq/L (ref 135–145)

## 2013-01-25 LAB — BRAIN NATRIURETIC PEPTIDE: Pro B Natriuretic peptide (BNP): 12 pg/mL (ref 0.0–100.0)

## 2013-01-25 MED ORDER — CARVEDILOL 25 MG PO TABS: 25.0000 mg | ORAL_TABLET | Freq: Two times a day (BID) | ORAL | Status: DC

## 2013-01-25 MED ORDER — FUROSEMIDE 40 MG PO TABS: 40.0000 mg | ORAL_TABLET | Freq: Two times a day (BID) | ORAL | Status: DC

## 2013-01-25 NOTE — Patient Instructions (Addendum)
Your physician recommends that you schedule a follow-up appointment in: 6-8 WEEKS WITH DR CRENSHAW  INCREASE CARVEDILOL TO 25 MG TWICE DAILY  Your physician recommends that you HAVE LAB WORK TODAY

## 2013-01-25 NOTE — Assessment & Plan Note (Addendum)
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 We will obtain all previous records. There may be also a contribution from hypertension. Recent echocardiogram showed an ejection fraction of 35-40%. Continue ARB. She is taking Coreg once daily. Increase to 25 mg by mouth twice a day. Further titration of medications as needed. Continue Lasix at present dose as she is euvolemic on examination. Check potassium, renal function and BNP. Patient counseled on low sodium diet and fluid restriction. If her blood pressure continues to run high despite the above medications we'll consider hydralazine/nitrates. I would also consider spironolactone.

## 2013-01-25 NOTE — Assessment & Plan Note (Addendum)
Most likely multifactorial including OSA, OHS, and cardiomyopathy. Recent VQ scan and lower extremity Dopplers negative.

## 2013-01-25 NOTE — Telephone Encounter (Signed)
Pt actually saw dr. Felicity Coyer will send to her...lmb

## 2013-01-25 NOTE — Progress Notes (Signed)
HPI: 37 year old female for evaluation of postpartum cardiomyopathy. 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 Previously followed by Dr Katrinka Blazing. Those records are not available. Over the past 6 months she has noticed increased dyspnea on exertion. She has chronic orthopnea but no PND. She has mild pedal edema. Some chest tightness with lying flat. She was admitted in August. Echocardiogram in August of 2014 showed an ejection fraction of 35-40% and grade 2 diastolic dysfunction. VQ scan in August of 2014 was very low probability; lower ext doppers neg. TSH in August 2014 1.80; BNP 53. The patient's medications were adjusted. Lasix 40 mg daily was added and recently increased to twice a day by her primary care. There is been some improvement in dyspnea but it persists.  Current Outpatient Prescriptions  Medication Sig Dispense Refill  . acetaminophen (TYLENOL) 500 MG tablet Take 500 mg by mouth every 6 (six) hours as needed for pain.       Marland Kitchen amLODipine (NORVASC) 5 MG tablet Take 5 mg by mouth daily.      . Blood Pressure Monitoring ([facility] BLOOD PRESSURE MONITOR) KIT Monitor BP twice daily and prn  1 each  0  . carvedilol (COREG) 25 MG tablet Take 25 mg by mouth daily.      . cholecalciferol (VITAMIN D) 1000 UNITS tablet Take 1,000 Units by mouth daily.      . colesevelam (WELCHOL) 625 MG tablet Take 625 mg by mouth daily.       . diazepam (VALIUM) 10 MG tablet Take 10 mg by mouth daily.       Marland Kitchen dicyclomine (BENTYL) 10 MG capsule Take 10 mg by mouth 3 (three) times daily as needed (for abdominal cramping).       . furosemide (LASIX) 40 MG tablet Take 40 mg by mouth 2 (two) times daily.      Marland Kitchen losartan (COZAAR) 100 MG tablet Take 100 mg by mouth daily.      . magnesium citrate ([facility] MAGNESIUM CITRATE) SOLN Take 1 Bottle by mouth as needed.      . NON FORMULARY C-PAP      . ondansetron (ZOFRAN-ODT) 4 MG disintegrating tablet Take 4 mg by mouth every 8 (eight) hours as needed.       . promethazine (PHENERGAN) 25 MG tablet Take 25 mg by mouth every 6 (six) hours as needed for nausea.       No current facility-administered medications for this visit.    Allergies  Allergen Reactions  . Aspirin Other (See Comments)    wheezing  . Ivp Dye [Iodinated Diagnostic Agents] Other (See Comments)    Did something to my liver and kidneys told not to use it  . Vancomycin Other (See Comments)    "did something to my kidneys"  . Ciprofloxacin Itching and Rash  . Sulfa Antibiotics Itching and Rash    Past Medical History  Diagnosis Date  . Hypertension   . Ovarian cyst   . OSA (obstructive sleep apnea)     on CPAP   . IBS (irritable bowel syndrome)   . Postpartum cardiomyopathy   . Depression   . Anxiety     Past Surgical History  Procedure Laterality Date  . Abdominal surgery    . Cesarean section    . Cholecystectomy    . Foot surgery    . Tubal ligation  1999    History   Social History  . Marital Status: Married    Spouse Name:  N/A    Number of Children: 1  . Years of Education: N/A   Occupational History  . stay at home mom    Social History Main Topics  . Smoking status: Never Smoker   . Smokeless tobacco: Never Used  . Alcohol Use: No  . Drug Use: No  . Sexual Activity: Not on file   Other Topics Concern  . Not on file   Social History Narrative  . No narrative on file    Family History  Problem Relation Age of Onset  . Emphysema Maternal Grandmother     smoked  . Allergies Daughter   . Heart disease Maternal Grandmother 45    MI  . Rheum arthritis Mother     ROS: no fevers or chills, productive cough, hemoptysis, dysphasia, odynophagia, melena, hematochezia, dysuria, hematuria, rash, seizure activity, orthopnea, PND, claudication. Remaining systems are negative.  Physical Exam:   Blood pressure 131/90, pulse 90, height 5\' 5"  (1.651 m), weight 251 lb 6.4 oz (114.034 kg), last menstrual period 12/21/2012.  General:  Well  developed/obese in NAD Skin warm/dry Patient not depressed No peripheral clubbing Back-normal HEENT-normal/normal eyelids Neck supple/normal carotid upstroke bilaterally; no bruits; no JVD; no thyromegaly chest - CTA/ normal expansion CV - RRR/normal S1 and S2; no murmurs, rubs;  PMI nondisplaced, positive S3. Abdomen -NT/ND, no HSM, no mass, + bowel sounds, no bruit 2+ femoral pulses, no bruits Ext-no edema/chords, 2+ DP Neuro-grossly nonfocal  ECG 01/01/13-sinus rhythm, CRO septal MI, nonspecific ST changes

## 2013-01-25 NOTE — Assessment & Plan Note (Addendum)
Blood pressure is elevated. Increase carvedilol to 25 mg by mouth twice a day both for blood pressure and cardiomyopathy. Patient instructed to purchase a blood pressure cuff and monitor her blood pressure at home.

## 2013-01-26 ENCOUNTER — Telehealth: Payer: Self-pay | Admitting: *Deleted

## 2013-01-26 ENCOUNTER — Encounter: Payer: Self-pay | Admitting: Dietician

## 2013-01-26 ENCOUNTER — Encounter: Payer: 59 | Attending: Family Medicine | Admitting: Dietician

## 2013-01-26 VITALS — Ht 65.0 in | Wt 250.6 lb

## 2013-01-26 DIAGNOSIS — E669 Obesity, unspecified: Secondary | ICD-10-CM | POA: Insufficient documentation

## 2013-01-26 DIAGNOSIS — E876 Hypokalemia: Secondary | ICD-10-CM

## 2013-01-26 DIAGNOSIS — Z713 Dietary counseling and surveillance: Secondary | ICD-10-CM | POA: Insufficient documentation

## 2013-01-26 MED ORDER — POTASSIUM CHLORIDE CRYS ER 20 MEQ PO TBCR: 20.0000 meq | EXTENDED_RELEASE_TABLET | Freq: Two times a day (BID) | ORAL | Status: DC

## 2013-01-26 NOTE — Telephone Encounter (Signed)
Spoke with pt, aware of lab results. Script sent to the pharm. She asked about a referral to cardiac rehab. Will forward for dr Jens Som review

## 2013-01-26 NOTE — Patient Instructions (Addendum)
Continue eating 3 meals per day and add 2-3 snacks with protein.  Eat snacks or meals every 3-5 hours.  Fill half of your plate with vegetables. Limit starches to half of your plate. Use food labels or calorie Brooke Dare app to check for sodium content in foods.  Aim to have less than 300 mg of sodium per serving.  Look for heart healthy fats: avocados, nuts, seeds, olive oil, sesame oil, fish, and canola.  When possible, aim to get physical activity with a goal of 30 minute per day 5 days per week.

## 2013-01-26 NOTE — Telephone Encounter (Signed)
Order signed and placed in medical records for faxing

## 2013-01-26 NOTE — Progress Notes (Signed)
  Medical Nutrition Therapy:  Appt start time: 0915 end time:  1015.  Assessment:  Primary concerns today: [name] is here for weight loss in order to help out her heart. Has gained weight over the years and used to be very active when in the [employer]. Very concerned about her heart health since she's had cardio myopathy since the birth of her child. Was hospitalized a couple of weeks ago. States she was instructed to not have a lot of fluid and restrict sodium but does not have specific numbers to be under. Trying to get started with cardiac rehab at [facility].   [name] lives with her husband and daughter and is not currently working. Recently she started doing the grocery shopping and food preparation, previously husband took care of the food.   Has cut out sodium where possible, sweets, processed meats, and sweet beverages for the past month on her doctor's recommendation. Previously ate a lot of ramen, sweet tea, soda, and fast food before the last month. Has not lost too much weight yet.   MEDICATIONS: see list   DIETARY INTAKE:  24-hr recall:  B ( AM): 100 calorie cottage cheese with 1/4 cup granola OR [place] yogurt with granola with water Snk ( AM): none  L ( PM): soup or side salad from [employer] if out for lunch OR leftover dinner such as salmon with brussels sprouts and rice (1/2 cup) or potato soup with with water or sparkling water Snk ( PM): none D ( PM): chicken breast, zucchini, angel hair pasta with water  Snk ( PM): fruit cup OR cottage cheese/yogurt Beverages: water or sparkling water  Usual physical activity: no structured exercise yet, easily out of breath  Estimated energy needs: 1800 calories 200 g carbohydrates 135 g protein 50 g fat  Progress Towards Goal(s):  In progress.   Nutritional Diagnosis:  NB-1.1 Food and nutrition-related knowledge deficit As related to history of large portions and excess sodium intake.  As evidenced by BMI of 41.7 and doctor  recommendation to lose weight.    Intervention:  Nutrition counseling provided. Discussed hidden sources of sodium such as soup, cheese, and bread using Calorie King to check for sodium in foods. Also discussed appropriate portion sizes of protein, carbohydrates, and healthy fats. Recommended adding snacks with protein in order to prevent hunger at meal time and keep energy level up.   Plan: Continue eating 3 meals per day and add 2-3 snacks with protein.  Eat snacks or meals every 3-5 hours.  Fill half of your plate with vegetables. Limit starches to half of your plate. Use food labels or calorie Brooke Dare app to check for sodium content in foods.  Aim to have less than 300 mg of sodium per serving.  Look for heart healthy fats: avocados, nuts, seeds, olive oil, sesame oil, fish, and canola.  When possible, aim to get physical activity with a goal of 30 minute per day 5 days per week.   Handouts given during visit include:  MyPlate Handout  16X CHO Snacks  1500 Calorie Meal Plan from eatright.org  Monitoring/Evaluation:  Dietary intake, exercise, and body weight in 2 month(s).

## 2013-01-26 NOTE — Telephone Encounter (Signed)
Ok for rehab [name]  

## 2013-02-01 ENCOUNTER — Ambulatory Visit (INDEPENDENT_AMBULATORY_CARE_PROVIDER_SITE_OTHER): Payer: 59 | Admitting: Pulmonary Disease

## 2013-02-01 ENCOUNTER — Encounter: Payer: Self-pay | Admitting: Pulmonary Disease

## 2013-02-01 VITALS — BP 110/82 | HR 92 | Temp 98.8°F | Ht 65.0 in | Wt 249.4 lb

## 2013-02-01 DIAGNOSIS — G4733 Obstructive sleep apnea (adult) (pediatric): Secondary | ICD-10-CM

## 2013-02-01 NOTE — Patient Instructions (Addendum)
Will send an order to your homecare company to get you an auto device permanently for you to use at home.   Work on weight loss followup with me in one year, but call if having issues with your cpap.

## 2013-02-01 NOTE — Progress Notes (Signed)
Subjective:    Patient ID: [name], female    DOB: [birth year], 37 y.o.   MRN: [id number]  HPI The patient is a 37 year old female who had been asked to see for management of obstructive sleep apnea.  She was diagnosed with mild OSA 2009, with an AHI of 9 events per hour.  She was placed on CPAP, however has had poor tolerance with this because of a feeling of smothering.  She has not seen improvement in her sleep or daytime alertness since being on a regular CPAP device.  The middle of last month, she was changed over to an automatic device, and has done much better with this.  She is currently using a nasal mask, but prefers nasal pillows.  She feels that she is more rested since being on the auto device, with improvement in her daytime alertness.  She has been able to leave the device on more often greater than 4 hours, and up to 6-8 hours on some nights.  The patient states that her weight is up 10-15 pounds over the last 2 years, and her Epworth score today is 17.   Sleep Questionnaire What time do you typically go to bed?( Between what hours) 11p-11:30p 11p-11:30p at 1439 on 02/01/13 by Maisie Fus, CMA How long does it take you to fall asleep? 30-107mins 30-62mins at 1439 on 02/01/13 by Maisie Fus, CMA How many times during the night do you wake up? 1 1 at 1439 on 02/01/13 by Maisie Fus, CMA What time do you get out of bed to start your day? 0800 0800 at 1439 on 02/01/13 by Maisie Fus, CMA Do you drive or operate heavy machinery in your occupation? No No at 1439 on 02/01/13 by Maisie Fus, CMA How much has your weight changed (up or down) over the past two years? (In pounds) 15 lb (6.804 kg)15 lb (6.804 kg) increase at 1439 on 02/01/13 by Maisie Fus, CMA Have you ever had a sleep study before? [facility]  at 1439 on 02/01/13 by Maisie Fus, CMA If yes, location of study? [facility] at 1439 on 02/01/13 by Maisie Fus, CMA If  yes, date of study? 5 years ago. 5 years ago. at 1439 on 02/01/13 by Maisie Fus, CMA Do you currently use CPAP? Yes Yes at 1439 on 02/01/13 by Maisie Fus, CMA If so, what pressure? AUTO AUTO at 1439 on 02/01/13 by Maisie Fus, CMA Do you wear oxygen at any time? Yes Yes at 1439 on 02/01/13 by Maisie Fus, CMA O2 Flow Rate (L/min)    Review of Systems  Constitutional: Positive for appetite change. Negative for fever and unexpected weight change.  HENT: Positive for congestion. Negative for ear pain, nosebleeds, sore throat, rhinorrhea, sneezing, trouble swallowing, dental problem, postnasal drip and sinus pressure.   Eyes: Negative for redness and itching.  Respiratory: Positive for shortness of breath. Negative for cough, chest tightness and wheezing.   Cardiovascular: Positive for chest pain. Negative for palpitations and leg swelling.  Gastrointestinal: Positive for abdominal pain. Negative for nausea and vomiting.  Genitourinary: Negative for dysuria.  Musculoskeletal: Positive for joint swelling and arthralgias.  Skin: Negative for rash.  Neurological: Positive for headaches.  Hematological: Does not bruise/bleed easily.  Psychiatric/Behavioral: Positive for dysphoric mood. The patient is nervous/anxious.        Objective:   Physical Exam Constitutional:  Obese female, no acute distress  HENT:  Nares patent without discharge, mild septal deviation to left with narrowing.   Oropharynx without exudate, palate and uvula are mildly elongated.   Eyes:  Perrla, eomi, no scleral icterus  Neck:  No JVD, no TMG  Cardiovascular:  Normal rate, regular rhythm, no rubs or gallops.  No murmurs        Intact distal pulses  Pulmonary :  Normal breath sounds, no stridor or respiratory distress   No rales, rhonchi, or wheezing  Abdominal:  Soft, nondistended, bowel sounds present.  No tenderness noted.   Musculoskeletal:  No lower extremity edema noted.  Lymph  Nodes:  No cervical lymphadenopathy noted  Skin:  No cyanosis noted  Neurologic:  Alert, appropriate, moves all 4 extremities without obvious deficit.         Assessment & Plan:

## 2013-02-01 NOTE — Assessment & Plan Note (Signed)
The patient has a history of mild obstructive sleep apnea, but has been intolerant of CPAP because of a smothering sensation.  Most recently, she has been placed on an automatic device, and has done much better with this.  She is wearing more consistently, and her symptoms are becoming under better control.  At this point, I would like to see about getting an automatic device permanently, and I've also encouraged her to work aggressively on weight loss.  I've also stressed her the importance of wearing her CPAP at least 6 hours a night on a consistent basis.

## 2013-02-02 ENCOUNTER — Telehealth: Payer: Self-pay | Admitting: Pulmonary Disease

## 2013-02-02 ENCOUNTER — Telehealth: Payer: Self-pay | Admitting: Cardiology

## 2013-02-02 ENCOUNTER — Other Ambulatory Visit (INDEPENDENT_AMBULATORY_CARE_PROVIDER_SITE_OTHER): Payer: 59

## 2013-02-02 ENCOUNTER — Encounter (HOSPITAL_COMMUNITY)
Admission: RE | Admit: 2013-02-02 | Discharge: 2013-02-02 | Disposition: A | Discharge status: Home / Self Care | Payer: 59 | Source: Ambulatory Visit | Attending: Cardiology | Admitting: Cardiology

## 2013-02-02 DIAGNOSIS — G4733 Obstructive sleep apnea (adult) (pediatric): Secondary | ICD-10-CM | POA: Insufficient documentation

## 2013-02-02 DIAGNOSIS — I10 Essential (primary) hypertension: Secondary | ICD-10-CM | POA: Insufficient documentation

## 2013-02-02 DIAGNOSIS — Z5189 Encounter for other specified aftercare: Secondary | ICD-10-CM | POA: Insufficient documentation

## 2013-02-02 DIAGNOSIS — E876 Hypokalemia: Secondary | ICD-10-CM

## 2013-02-02 LAB — BASIC METABOLIC PANEL
BUN: 15 mg/dL (ref 6–23)
CO2: 28 mEq/L (ref 19–32)
Calcium: 9.4 mg/dL (ref 8.4–10.5)
Chloride: 100 mEq/L (ref 96–112)
Creatinine, Ser: 1 mg/dL (ref 0.4–1.2)
GFR: 84.29 mL/min (ref >60.00)
Glucose, Bld: 90 mg/dL (ref 70–99)
Potassium: 3.4 mEq/L — ABNORMAL LOW (ref 3.5–5.1)
Sodium: 136 mEq/L (ref 135–145)

## 2013-02-02 NOTE — Telephone Encounter (Signed)
Routed to Deliah Goody, RN/Dr. Jens Som to review regarding Disability Letter Request.

## 2013-02-02 NOTE — Telephone Encounter (Signed)
Spoke to pt [name] is aware order was given to [facility] sent another message to [facility] [email or web address]  re this order Tobe Sos

## 2013-02-02 NOTE — Progress Notes (Signed)
Cardiac Rehab Medication Review by a Pharmacist  Does the patient  feel that his/her medications are working for him/her?  yes  Has the patient been experiencing any side effects to the medications prescribed?  yes  Does the patient measure his/her own blood pressure or blood glucose at home?  yes   Does the patient have any problems obtaining medications due to transportation or finances?   no  Understanding of regimen: excellent Understanding of indications: excellent Potential of compliance: excellent    Pharmacist comments: Patient is knowledgeable about her medications and reasons she takes them.  I educated her that Wellbutrin and Fluoxetine may not immediately relieve symptoms since she has just started each medication.  She complains that she does not have much appetite and has some chest pressure (the physician is aware).  Otherwise, she is doing well with her medications.  Thank you, Piedad Climes, PharmD Clinical Pharmacist - Resident Pager: [phone] Pharmacy: [phone] 02/02/2013 9:39 AM

## 2013-02-02 NOTE — Telephone Encounter (Signed)
New message:  She was to by her attorney to get a letter stating her condition regarding her disb.  Please call patient when this letter is available for pick up. Pt aware that debra is off

## 2013-02-02 NOTE — Telephone Encounter (Signed)
Resending: Routed to Deliah Goody, RN/Dr. Jens Som to review regarding Disability Letter Request.

## 2013-02-06 ENCOUNTER — Encounter (HOSPITAL_COMMUNITY)
Admission: RE | Admit: 2013-02-06 | Discharge: 2013-02-06 | Disposition: A | Discharge status: Home / Self Care | Payer: 59 | Source: Ambulatory Visit | Attending: Cardiology | Admitting: Cardiology

## 2013-02-06 ENCOUNTER — Encounter (HOSPITAL_COMMUNITY): Payer: 59

## 2013-02-06 ENCOUNTER — Other Ambulatory Visit: Payer: Self-pay

## 2013-02-06 DIAGNOSIS — I10 Essential (primary) hypertension: Secondary | ICD-10-CM

## 2013-02-06 NOTE — Progress Notes (Addendum)
[name] was noted to have some intermittent PVC's during exercise today and intermittent frequent PVC's. [name] denies feeling palpitations but complained of feeling tired   Dr Ludwig Clarks office called and notified.  [name] spoke with Anabel Halon LPN.  Patient given instructions on potassium dose. [name] is scheduled to have her BMET rechecked on 02/13/2013.  [name] will return to exercise after her BMET is rechecked.  [name] also told me she has been depressed for the past year and recently started on an antidepressant prescribed by her psychologist. Will continue to monitor the patient throughout  the program. Emotional support given to patient.

## 2013-02-06 NOTE — Progress Notes (Signed)
Today's ECG tracings faxed to Dr Jens Som office for review.

## 2013-02-08 ENCOUNTER — Ambulatory Visit (INDEPENDENT_AMBULATORY_CARE_PROVIDER_SITE_OTHER): Payer: 59 | Admitting: Internal Medicine

## 2013-02-08 ENCOUNTER — Encounter: Payer: Self-pay | Admitting: Internal Medicine

## 2013-02-08 ENCOUNTER — Encounter (HOSPITAL_COMMUNITY): Payer: 59

## 2013-02-08 VITALS — BP 112/80 | HR 91 | Temp 98.2°F | Ht 65.0 in | Wt 251.0 lb

## 2013-02-08 DIAGNOSIS — R0609 Other forms of dyspnea: Secondary | ICD-10-CM

## 2013-02-08 DIAGNOSIS — R0600 Dyspnea, unspecified: Secondary | ICD-10-CM

## 2013-02-08 LAB — PULMONARY FUNCTION TEST

## 2013-02-08 NOTE — Patient Instructions (Addendum)
Ideally you should walk 30 min daily at level where you are short of breath but never out of breath except at the very end of exercise  Call me to schedule a CPST after at least 2 weeks of doing this daily if not satisfied that you are improving.

## 2013-02-08 NOTE — Progress Notes (Signed)
PFT done today. 

## 2013-02-08 NOTE — Assessment & Plan Note (Signed)
I had an extended summary  discussion with the patient today lasting 15 to 20 minutes of a 25 minute visit on the following issues:  Given BNP <<< 100 I think it's unlikely the decreased ef explains her doe which is chronic and assoc with morbid obesity.  Unless Dr Katrinka Blazing disagrees, all I rec is a mod ex program with goal of placing her consistently in neg cal bal  See instructions for specific recommendations which were reviewed directly with the patient who was given a copy with highlighter outlining the key components.   She does need pft's to complete the w/u > scheduled

## 2013-02-08 NOTE — Progress Notes (Signed)
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   HPI 12/28/2012 1st pulmonary eval/ [name] cc indolent onset progressive doe x 15 years gradually worse to point where has to stop at top one flight of steps or length of parking lot, assoc with noct dry cough esp if too flat rec No change rx W/u for PE > neg  Admit date: 01/01/2013  Discharge date: 01/04/2013  Time spent: 30 minutes  Recommendations for Outpatient Follow-up:  1. Follow up with PCP in 1-2 weeks 2. Repeat basic metabolic profile in 1 week 3. Consider resuming Cozaar if blood pressure allows. This was held secondary to a soft blood pressure during hospitalization 4. Consider outpatient Cardiology follow up 5. 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 She has seen her cardiologist and pulmonologist as outpt. She has had a vq scan showing low prob, along with u/s le neg for dvt. She is scheduled for pfts and repeat sleep study. She has not been having any fevers. N/v. No diarrhea. Some occasional le swelling and hand swelling, only on hctz never been on any stronger diuretics. No cough. Cp free now. Last known EF unknown no echo done in 2  years.  Hospital Course:  The patient was admitted to the floor. A a cxr was found to be unremarkable. On further questioning, the patient describes symptoms suggestive of constipation. She was started on a bowel regimen with good results. However, the patient continued to complain of shortness of breath, worse with exertion. Pulmonary was consulted. The patient was recommended to continue with PFT's ordered, proceed with a pending sleep study, and cont CPAP with an auto-titration device which is ordered for home with data to go to [name]. The patient is to follow up with Pulmonary as an outpatient.  Of note, the patient was noted to have and EF of 35-40% on 2D echo. BNP was normal. The patient will be discharged home with lasix instead of hctz. She should resume Cozaar once her blood pressure allows it.This was held for a soft blood pressure during hospitalization.       01/13/2013  Post hosp f/u ov/[name]  Chief Complaint  Patient presents with  . Shortness of Breath    Breathing is slightly improved. Reports still having chest pressure.   14 steps  x 6 years sob  but worse last  6 months rec No change rx, work on wt loss  02/08/2013 f/u ov/[name] re: sob  Chief Complaint  Patient presents with  . Followup with PFT    Pt states her breathing is the same. Chest pressure some better, but comes and goes.  No obvious day to day or daytime variabilty or assoc cough or cp or chest tightness, subjective wheeze overt sinus or hb symptoms. No unusual exp hx or h/o childhood pna/ asthma or knowledge of premature birth.   Sleeping ok s cpap without nocturnal  or early am exacerbation  of respiratory  c/o's or need for noct saba or daytime hypersomnolence. Also denies any obvious fluctuation of symptoms with weather or environmental changes or other aggravating or alleviating factors except as outlined above    Current Medications, Allergies, Past Medical History, Past Surgical History, Family  History, and Social History were reviewed in [employer] record.  ROS  The following are not active complaints unless bolded sore throat, dysphagia, dental problems, itching, sneezing,  nasal congestion or excess/ purulent secretions, ear ache,   fever, chills, sweats, unintended wt loss, pleuritic or exertional cp, hemoptysis,  orthopnea pnd or leg swelling, presyncope, palpitations, heartburn, abdominal pain, anorexia, nausea, vomiting, diarrhea  or change in bowel or urinary habits, change in stools or urine, dysuria,hematuria,  rash, arthralgias, visual complaints, headache, numbness weakness or ataxia or problems with walking or coordination,  change in mood/affect or memory.            Objective:   Physical Exam  Baseline wt 165-170 before post partum around 1999  Wt Readings from Last 3 Encounters:  02/08/13 251 lb (113.853 kg)  02/02/13 249 lb 12.5 oz (113.3 kg)  02/01/13 249 lb 6.4 oz (113.127 kg)         HEENT: nl dentition, turbinates, and orophanx. Nl external ear canals without cough reflex   NECK :  without JVD/Nodes/TM/ nl carotid upstrokes bilaterally   LUNGS: no acc muscle use, clear to A and P bilaterally without cough on insp or exp maneuvers   CV:  RRR  no s3 or murmur or increase in P2, no edema   ABD:  soft and nontender with nl excursion in the supine position. No bruits or organomegaly, bowel sounds nl  MS:  warm without deformities, calf tenderness, cyanosis or clubbing  SKIN: warm and dry without lesions          cxr 12/28/12 Mild enlargement of cardiac silhouette.  No acute abnormalities.       Assessment & Plan:

## 2013-02-09 NOTE — Assessment & Plan Note (Addendum)
-   12/28/2012  Walked RA x 3 laps @ 185 ft each stopped due to end of study, no sob but 02 sats fell from 96 to 93  - Labs 12/28/12 ok including bnp x for d dimer up at 0.78 >  bilateral venous dopplers 12/30/12 > neg - V/Q 12/30/2012 > No ventilation or perfusion defects. Very low probability of pulmonary embolus. - 01/13/2013  Walked RA x 3 laps @ 185 ft each stopped due to  dend of study, no desat - 02/08/2013 pft's wnl including fef 25-75 and DLCO  I had an extended final summary discussion with the patient today lasting 15 to 20 minutes of a 25 minute visit on the following issues:   DOE w/u reviewd and remains unexplained but mostly wt related plus component of chf   Next step is cpst is reconditioning not effective

## 2013-02-10 ENCOUNTER — Telehealth (HOSPITAL_COMMUNITY): Payer: Self-pay | Admitting: Internal Medicine

## 2013-02-10 ENCOUNTER — Encounter (HOSPITAL_COMMUNITY): Payer: 59

## 2013-02-13 ENCOUNTER — Encounter (HOSPITAL_COMMUNITY): Payer: 59

## 2013-02-13 ENCOUNTER — Telehealth (HOSPITAL_COMMUNITY): Payer: Self-pay | Admitting: *Deleted

## 2013-02-13 ENCOUNTER — Other Ambulatory Visit (INDEPENDENT_AMBULATORY_CARE_PROVIDER_SITE_OTHER): Payer: 59

## 2013-02-13 DIAGNOSIS — I10 Essential (primary) hypertension: Secondary | ICD-10-CM

## 2013-02-13 LAB — BASIC METABOLIC PANEL
BUN: 11 mg/dL (ref 6–23)
CO2: 25 mEq/L (ref 19–32)
Calcium: 9.3 mg/dL (ref 8.4–10.5)
Chloride: 105 mEq/L (ref 96–112)
Creatinine, Ser: 1 mg/dL (ref 0.4–1.2)
GFR: 82.27 mL/min (ref >60.00)
Glucose, Bld: 96 mg/dL (ref 70–99)
Potassium: 4.5 mEq/L (ref 3.5–5.1)
Sodium: 135 mEq/L (ref 135–145)

## 2013-02-15 ENCOUNTER — Other Ambulatory Visit (INDEPENDENT_AMBULATORY_CARE_PROVIDER_SITE_OTHER): Payer: 59

## 2013-02-15 ENCOUNTER — Ambulatory Visit (INDEPENDENT_AMBULATORY_CARE_PROVIDER_SITE_OTHER): Payer: 59 | Admitting: Internal Medicine

## 2013-02-15 ENCOUNTER — Encounter (HOSPITAL_COMMUNITY): Payer: 59

## 2013-02-15 ENCOUNTER — Encounter (HOSPITAL_COMMUNITY)
Admission: RE | Admit: 2013-02-15 | Discharge: 2013-02-15 | Disposition: A | Discharge status: Home / Self Care | Payer: 59 | Source: Ambulatory Visit | Attending: Cardiology | Admitting: Cardiology

## 2013-02-15 ENCOUNTER — Encounter: Payer: Self-pay | Admitting: Internal Medicine

## 2013-02-15 VITALS — BP 110/78 | HR 84 | Temp 97.6°F | Wt 247.1 lb

## 2013-02-15 DIAGNOSIS — K589 Irritable bowel syndrome without diarrhea: Secondary | ICD-10-CM

## 2013-02-15 DIAGNOSIS — F341 Dysthymic disorder: Secondary | ICD-10-CM

## 2013-02-15 DIAGNOSIS — F418 Other specified anxiety disorders: Secondary | ICD-10-CM

## 2013-02-15 DIAGNOSIS — G4733 Obstructive sleep apnea (adult) (pediatric): Secondary | ICD-10-CM

## 2013-02-15 DIAGNOSIS — O903 Peripartum cardiomyopathy: Secondary | ICD-10-CM

## 2013-02-15 DIAGNOSIS — Z23 Encounter for immunization: Secondary | ICD-10-CM

## 2013-02-15 LAB — LIPID PANEL
Cholesterol: 144 mg/dL (ref 0–200)
HDL: 43.3 mg/dL (ref >39.00)
LDL Cholesterol: 84 mg/dL (ref 0–99)
Total CHOL/HDL Ratio: 3
Triglycerides: 83 mg/dL (ref 0.0–149.0)
VLDL: 16.6 mg/dL (ref 0.0–40.0)

## 2013-02-15 NOTE — Assessment & Plan Note (Signed)
Chronic and severe symptoms, exac by medical illness at young age Prior trials zoloft, prozac, cymbalta, xanax and lyrica ineffective at symptoms control Takes valium qhs prn Now working with specialist Dr Evelene Croon for med mgmt of same since 01/2013: on Wellbutrin TID and prozac Support offered, no med changes recommended

## 2013-02-15 NOTE — Progress Notes (Signed)
  Subjective:    Patient ID: [name], female    DOB: Apr 11, 1976, 37 y.o.   MRN: [id number]  HPI  Here for follow up - reviewed chronic medical issues:  CM hx - initially dx postpartum but did not resolve. symptomatic dyspnea on exertion. Recent hospitalization for same summer 2014, now on daily diuretics - no edema or chest pain but dyspnea on exertion with min activity- working with local cards ([facility]) and in cardiac rehab  hypertension -resistant to tx - ARB temp on hold after hospitalization due to ARI, then resumed at lower dose - reports compliance with meds as rx'd  Anxiety/depession - exacerabated by medical illnesses - previously ineffective symptoms control with various SSRI, SNRI and BZs - working with psyc Dr Evelene Croon -on Wellbutrin and Prozac - slow to improve  OSA - compliant with CPAP - following with pulm and working on weight reduction with low carb low Na diet.  Past Medical History  Diagnosis Date  . Hypertension   . Ovarian cyst   . OSA (obstructive sleep apnea)     on CPAP   . IBS (irritable bowel syndrome)   . Postpartum cardiomyopathy   . Depression   . Anxiety     Review of Systems  Constitutional: Positive for activity change (chronic decrease in past 5 years, worse since CHF hosp) and fatigue (severe). Negative for fever and unexpected weight change.  Respiratory: Positive for shortness of breath. Negative for cough.   Cardiovascular: Negative for palpitations and leg swelling.  Gastrointestinal: Positive for diarrhea (chronic, severe). Negative for abdominal pain.  Psychiatric/Behavioral: Positive for sleep disturbance and dysphoric mood. Negative for suicidal ideas and self-injury. The patient is nervous/anxious.         Objective:   Physical Exam BP 110/78  Pulse 84  Temp(Src) 97.6 F (36.4 C) (Oral)  Wt 247 lb 1.9 oz (112.093 kg)  BMI 41.12 kg/m2  SpO2 98% Wt Readings from Last 3 Encounters:  02/15/13 247 lb 1.9 oz (112.093 kg)   02/08/13 251 lb (113.853 kg)  02/02/13 249 lb 12.5 oz (113.3 kg)   Constitutional: She is overweight, but appears well-developed and well-nourished. No distress.  Neck: Thick. Normal range of motion. Neck supple. No JVD present. No thyromegaly present.  Cardiovascular: Normal rate, regular rhythm and normal heart sounds.  No murmur heard. No BLE edema. Pulmonary/Chest: Effort normal and breath sounds normal. No respiratory distress. She has no wheezes.  Neurological: She is alert and oriented to person, place, and time. No cranial nerve deficit. Coordination, balance, strength, speech and gait are normal. Psychiatric: She has a mildly anxious/ dysphoric mood and affect. Her behavior is normal. Judgment and thought content normal.   Lab Results  Component Value Date   WBC 9.1 01/01/2013   HGB 13.5 01/01/2013   HCT 39.8 01/01/2013   PLT 288 01/01/2013   GLUCOSE 96 02/13/2013   ALT 33 01/13/2007   AST 30 01/13/2007   NA 135 02/13/2013   K 4.5 02/13/2013   CL 105 02/13/2013   CREATININE 1.0 02/13/2013   BUN 11 02/13/2013   CO2 25 02/13/2013   TSH 1.80 12/28/2012        Assessment & Plan:   See problem list. Medications and labs reviewed today.  Time spent with pt today 25 minutes, greater than 50% time spent counseling patient on CM hx, severe anxiety and medication review. Also review of ongoing disability application (on 2nd application/appeal)

## 2013-02-15 NOTE — Assessment & Plan Note (Signed)
hosp for exac of same 12/2012 - now on daily lasix No edema or change in chronic dyspnea on exertion Working with cardiology on same - in cardiac rehab On coreg, lasix - low Na diet and BP control

## 2013-02-15 NOTE — Assessment & Plan Note (Signed)
Severe diarrhea symptoms keeping pt in home (other than doctor visits) On welchol prn - no new change in symptoms

## 2013-02-15 NOTE — Progress Notes (Signed)
[name] started her first day of exercise at cardiac rehab.  Blood pressure's stable. [name] complained of felling fatigued and stopped a few times on the treadmill.  No ventricular ectopy noted this afternoon.  Oxygen saturation 97% on room air. Continued emotional support given to the patient. Will continue to monitor the patient throughout  the program.

## 2013-02-15 NOTE — Patient Instructions (Signed)
It was good to see you today. Tdap immunization updated today - consider receiving a flu shot in the next 6 weeks to immunize yourself against this upper respiratory disease We have reviewed your prior records including labs and tests today Medications reviewed and updated, no changes recommended at this time. Continue working with your various specialists as ongoing. Please schedule followup in 6 months to continue review, call sooner if problems.

## 2013-02-15 NOTE — Assessment & Plan Note (Signed)
Auto CPAP ongoing for same - working sleep specialist Clance Pt understands relationship to obesity but has been unable to successfully implement changes for sustained weight reduction encouragement provided

## 2013-02-17 ENCOUNTER — Encounter (HOSPITAL_COMMUNITY)
Admission: RE | Admit: 2013-02-17 | Discharge: 2013-02-17 | Disposition: A | Discharge status: Home / Self Care | Payer: 59 | Source: Ambulatory Visit | Attending: Cardiology | Admitting: Cardiology

## 2013-02-17 ENCOUNTER — Telehealth: Payer: Self-pay | Admitting: Cardiology

## 2013-02-17 ENCOUNTER — Encounter (HOSPITAL_COMMUNITY): Payer: 59

## 2013-02-17 ENCOUNTER — Encounter: Payer: Self-pay | Admitting: Cardiology

## 2013-02-17 MED ORDER — POTASSIUM CHLORIDE CRYS ER 20 MEQ PO TBCR: 40.0000 meq | EXTENDED_RELEASE_TABLET | Freq: Two times a day (BID) | ORAL | Status: DC

## 2013-02-17 NOTE — Telephone Encounter (Signed)
Discussed with dr Jens Som, pt made aware the office note from her visit contains all the information disability will need. Dr Jens Som will not do another letter. Patient voiced understanding

## 2013-02-17 NOTE — Telephone Encounter (Signed)
Left message for pt to call.

## 2013-02-17 NOTE — Telephone Encounter (Signed)
Spoke with pt, confirmed with pt that she was taking kcl 20 meq two tablets in the morning and two tablet in the afternoon when her labs were drawn on the 22nd. She will continue on the same dose. New script sent to pharm.

## 2013-02-17 NOTE — Telephone Encounter (Signed)
New problem    Has question regarding medication -  K+ 20 MEQ . Now on  80 mg now .

## 2013-02-20 ENCOUNTER — Encounter (HOSPITAL_COMMUNITY): Payer: 59

## 2013-02-20 ENCOUNTER — Telehealth (HOSPITAL_COMMUNITY): Payer: Self-pay | Admitting: Internal Medicine

## 2013-02-22 ENCOUNTER — Encounter (HOSPITAL_COMMUNITY)
Admission: RE | Admit: 2013-02-22 | Discharge: 2013-02-22 | Disposition: A | Discharge status: Home / Self Care | Payer: 59 | Source: Ambulatory Visit | Attending: Cardiology | Admitting: Cardiology

## 2013-02-22 ENCOUNTER — Encounter (HOSPITAL_COMMUNITY): Payer: 59

## 2013-02-22 DIAGNOSIS — G4733 Obstructive sleep apnea (adult) (pediatric): Secondary | ICD-10-CM | POA: Insufficient documentation

## 2013-02-22 DIAGNOSIS — Z5189 Encounter for other specified aftercare: Secondary | ICD-10-CM | POA: Insufficient documentation

## 2013-02-22 DIAGNOSIS — I10 Essential (primary) hypertension: Secondary | ICD-10-CM | POA: Insufficient documentation

## 2013-02-22 NOTE — Progress Notes (Signed)
Reviewed home exercise with pt today.  Pt plans to walk and use trampoline at home for exercise.  Reviewed THR, pulse, RPE, sign and symptoms, and when to call 911 or MD.  Pt voiced understanding. Fabio Pierce, MA, ACSM RCEP

## 2013-02-23 ENCOUNTER — Encounter: Payer: Self-pay | Admitting: Internal Medicine

## 2013-02-24 ENCOUNTER — Encounter (HOSPITAL_COMMUNITY): Payer: 59

## 2013-02-24 ENCOUNTER — Other Ambulatory Visit: Payer: Self-pay | Admitting: *Deleted

## 2013-02-24 ENCOUNTER — Encounter (HOSPITAL_COMMUNITY)
Admission: RE | Admit: 2013-02-24 | Discharge: 2013-02-24 | Disposition: A | Discharge status: Home / Self Care | Payer: 59 | Source: Ambulatory Visit | Attending: Cardiology | Admitting: Cardiology

## 2013-02-24 MED ORDER — PROMETHAZINE HCL 25 MG PO TABS: 25.0000 mg | ORAL_TABLET | Freq: Four times a day (QID) | ORAL | Status: DC | PRN

## 2013-02-24 NOTE — Telephone Encounter (Signed)
Sent email needing refill on her promethazine.../lmb 

## 2013-02-27 ENCOUNTER — Encounter (HOSPITAL_COMMUNITY): Payer: 59

## 2013-03-01 ENCOUNTER — Encounter (HOSPITAL_COMMUNITY): Payer: 59

## 2013-03-02 ENCOUNTER — Telehealth (HOSPITAL_COMMUNITY): Payer: Self-pay | Admitting: *Deleted

## 2013-03-03 ENCOUNTER — Encounter (HOSPITAL_COMMUNITY): Payer: 59

## 2013-03-03 ENCOUNTER — Encounter (HOSPITAL_COMMUNITY): Admission: RE | Admit: 2013-03-03 | Payer: 59 | Source: Ambulatory Visit

## 2013-03-05 ENCOUNTER — Encounter (HOSPITAL_COMMUNITY): Payer: Self-pay | Admitting: Emergency Medicine

## 2013-03-05 ENCOUNTER — Emergency Department (HOSPITAL_COMMUNITY)
Admission: EM | Admit: 2013-03-05 | Discharge: 2013-03-05 | Disposition: A | Discharge status: Home / Self Care | Payer: 59 | Attending: Emergency Medicine | Admitting: Emergency Medicine

## 2013-03-05 DIAGNOSIS — G4733 Obstructive sleep apnea (adult) (pediatric): Secondary | ICD-10-CM | POA: Insufficient documentation

## 2013-03-05 DIAGNOSIS — H02849 Edema of unspecified eye, unspecified eyelid: Secondary | ICD-10-CM | POA: Insufficient documentation

## 2013-03-05 DIAGNOSIS — Z79899 Other long term (current) drug therapy: Secondary | ICD-10-CM | POA: Insufficient documentation

## 2013-03-05 DIAGNOSIS — K589 Irritable bowel syndrome without diarrhea: Secondary | ICD-10-CM | POA: Insufficient documentation

## 2013-03-05 DIAGNOSIS — H5789 Other specified disorders of eye and adnexa: Secondary | ICD-10-CM | POA: Insufficient documentation

## 2013-03-05 DIAGNOSIS — Z888 Allergy status to other drugs, medicaments and biological substances status: Secondary | ICD-10-CM | POA: Insufficient documentation

## 2013-03-05 DIAGNOSIS — Z8742 Personal history of other diseases of the female genital tract: Secondary | ICD-10-CM | POA: Insufficient documentation

## 2013-03-05 DIAGNOSIS — F329 Major depressive disorder, single episode, unspecified: Secondary | ICD-10-CM | POA: Insufficient documentation

## 2013-03-05 DIAGNOSIS — F3289 Other specified depressive episodes: Secondary | ICD-10-CM | POA: Insufficient documentation

## 2013-03-05 DIAGNOSIS — H5712 Ocular pain, left eye: Secondary | ICD-10-CM

## 2013-03-05 DIAGNOSIS — H5710 Ocular pain, unspecified eye: Secondary | ICD-10-CM | POA: Insufficient documentation

## 2013-03-05 DIAGNOSIS — I10 Essential (primary) hypertension: Secondary | ICD-10-CM | POA: Insufficient documentation

## 2013-03-05 DIAGNOSIS — Z881 Allergy status to other antibiotic agents status: Secondary | ICD-10-CM | POA: Insufficient documentation

## 2013-03-05 DIAGNOSIS — Z882 Allergy status to sulfonamides status: Secondary | ICD-10-CM | POA: Insufficient documentation

## 2013-03-05 DIAGNOSIS — F411 Generalized anxiety disorder: Secondary | ICD-10-CM | POA: Insufficient documentation

## 2013-03-05 MED ORDER — FLUORESCEIN SODIUM 1 MG OP STRP
1.0000 | ORAL_STRIP | Freq: Once | OPHTHALMIC | Status: AC
  Administered 2013-03-05: 1 via OPHTHALMIC
  Filled 2013-03-05: qty 1

## 2013-03-05 MED ORDER — HYDROCODONE-ACETAMINOPHEN 5-325 MG PO TABS
1.0000 | ORAL_TABLET | Freq: Once | ORAL | Status: AC
  Administered 2013-03-05: 1 via ORAL
  Filled 2013-03-05: qty 1

## 2013-03-05 MED ORDER — TOBRAMYCIN 0.3 % OP SOLN: 1.0000 [drp] | OPHTHALMIC | Status: DC

## 2013-03-05 MED ORDER — IBUPROFEN 400 MG PO TABS
800.0000 mg | ORAL_TABLET | Freq: Once | ORAL | Status: AC
  Administered 2013-03-05: 800 mg via ORAL
  Filled 2013-03-05: qty 2

## 2013-03-05 MED ORDER — TETRACAINE HCL 0.5 % OP SOLN
2.0000 [drp] | Freq: Once | OPHTHALMIC | Status: AC
  Administered 2013-03-05: 2 [drp] via OPHTHALMIC
  Filled 2013-03-05: qty 2

## 2013-03-05 NOTE — ED Provider Notes (Signed)
Medical screening examination/treatment/procedure(s) were performed by non-physician practitioner and as supervising physician I was immediately available for consultation/collaboration.   Bernal Luhman, MD 03/05/13 2338 

## 2013-03-05 NOTE — ED Notes (Addendum)
Eye pain started yesterday and getting worse now. Stated call PCP and was told to come to ED. Denies any trauma to the eyes, denies any  taking  new medication.  Noted slight swelling to left lower eye lid with some redness.

## 2013-03-05 NOTE — ED Notes (Signed)
Pt c/o left sided eye pain. Symptoms excessive tears. Denies blurry vision or difficulty seeing

## 2013-03-05 NOTE — ED Notes (Signed)
OS 20/20 OD 20/15 OU 20/15-1 Reading with corrected lenses.

## 2013-03-05 NOTE — ED Provider Notes (Signed)
CSN: [id number]     Arrival date & time 03/05/13  0021 History   First MD Initiated Contact with Patient 03/05/13 0225     Chief Complaint  Patient presents with  . Eye Pain   HPI  History provided by the patient. Patient is a 36 year old female with history of hypertension who presents with points of left eye irritation and pain. Patient reports having irritation and pain for the past few days. She denies any trauma or injury. She does not believe she had anything get into her eye. She does feel some fullness and swelling around the left lower eyelid area. Pain is slightly worse with movement. She denies any worsened pain from bright lights. There is been some mild redness to the eye as well. She did make an appointment with her doctor on Tuesday but symptoms were worsening and she presents tonight for further evaluation. There's been no associated vision loss. Symptoms are associated with increased tearing. She denies any associated nasal congestion otorhinorrhea, cough or cold symptoms. No fever chills or sweats. Denies similar symptoms previously. She does wear corrective lenses but has not used contacts.    Past Medical History  Diagnosis Date  . Hypertension   . Ovarian cyst   . OSA (obstructive sleep apnea)     on CPAP   . IBS (irritable bowel syndrome)   . Postpartum cardiomyopathy   . Depression   . Anxiety    Past Surgical History  Procedure Laterality Date  . Abdominal surgery    . Cesarean section    . Cholecystectomy    . Foot surgery    . Tubal ligation  1999   Family History  Problem Relation Age of Onset  . Emphysema Maternal Grandmother     smoked  . Allergies Daughter   . Heart disease Maternal Grandmother 45    MI  . Rheum arthritis Mother    History  Substance Use Topics  . Smoking status: Never Smoker   . Smokeless tobacco: Never Used  . Alcohol Use: No   OB History   Grav Para Term Preterm Abortions TAB SAB Ect Mult Living                  Review of Systems  Constitutional: Negative for fever, chills and diaphoresis.  HENT: Negative for congestion, rhinorrhea and sore throat.   Eyes: Positive for pain, discharge and redness. Negative for photophobia, itching and visual disturbance.  Respiratory: Negative for cough.   All other systems reviewed and are negative.    Allergies  Aspirin; Ivp dye; Vancomycin; Ciprofloxacin; and Sulfa antibiotics  Home Medications   Current Outpatient Rx  Name  Route  Sig  Dispense  Refill  . acetaminophen (TYLENOL) 500 MG tablet   Oral   Take 500 mg by mouth every 6 (six) hours as needed for pain.         Marland Kitchen amLODipine (NORVASC) 5 MG tablet   Oral   Take 5 mg by mouth daily.         Marland Kitchen buPROPion (WELLBUTRIN XL) 150 MG 24 hr tablet   Oral   Take 450 mg by mouth daily.         . carvedilol (COREG) 25 MG tablet   Oral   Take 1 tablet (25 mg total) by mouth 2 (two) times daily with a meal.   60 tablet   12   . cholecalciferol (VITAMIN D) 1000 UNITS tablet   Oral   Take 1,000  Units by mouth daily.         . colesevelam (WELCHOL) 625 MG tablet   Oral   Take 625 mg by mouth daily.          . diazepam (VALIUM) 10 MG tablet   Oral   Take 10 mg by mouth at bedtime.          . dicyclomine (BENTYL) 10 MG capsule   Oral   Take 10 mg by mouth 3 (three) times daily as needed (for abdominal cramping).          Marland Kitchen FLUoxetine (PROZAC) 20 MG capsule   Oral   Take 1 capsule by mouth every morning.         . furosemide (LASIX) 40 MG tablet   Oral   Take 1 tablet (40 mg total) by mouth 2 (two) times daily.   60 tablet   12   . losartan (COZAAR) 100 MG tablet   Oral   Take 100 mg by mouth daily.         . ondansetron (ZOFRAN-ODT) 4 MG disintegrating tablet   Oral   Take 4 mg by mouth every 8 (eight) hours as needed.         . potassium chloride SA (K-DUR,KLOR-CON) 20 MEQ tablet   Oral   Take 2 tablets (40 mEq total) by mouth 2 (two) times daily.   124  tablet   12   . promethazine (PHENERGAN) 25 MG tablet   Oral   Take 1 tablet (25 mg total) by mouth every 6 (six) hours as needed for nausea.   30 tablet   0    BP 129/86  Pulse 76  Temp(Src) 97.6 F (36.4 C) (Oral)  Resp 18  SpO2 100% Physical Exam  Nursing note and vitals reviewed. Constitutional: She is oriented to person, place, and time. She appears well-developed and well-nourished. No distress.  HENT:  Head: Normocephalic.  Eyes: EOM are normal. Pupils are equal, round, and reactive to light. Lids are everted and swept, no foreign bodies found. Left eye exhibits no chemosis and no discharge. No foreign body present in the left eye. Left conjunctiva is injected. Left conjunctiva has no hemorrhage.  Fundoscopic exam:      The left eye shows no AV nicking, no hemorrhage and no papilledema.  Slit lamp exam:      The left eye shows no corneal abrasion, no corneal flare, no corneal ulcer, no hyphema, no hypopyon and no fluorescein uptake.  Mild injection to the sclera.  Extraocular pressures with Tono-Pen were 20, 17, 21, 22.    Neck: Normal range of motion. Neck supple.  Cardiovascular: Normal rate and regular rhythm.   Pulmonary/Chest: Effort normal and breath sounds normal. No respiratory distress. She has no wheezes. She has no rales.  Abdominal: Soft.  Musculoskeletal: Normal range of motion.  Lymphadenopathy:    She has no cervical adenopathy.  Neurological: She is alert and oriented to person, place, and time.  Skin: Skin is warm and dry. No rash noted.  Psychiatric: She has a normal mood and affect. Her behavior is normal.    ED Course  Procedures   Patient seen and evaluated. Patient appears well in no acute distress. Very mild injection of the left sclera. There is no significant swelling appreciated on exam. No hordeolum. Normal ocular pressures. At this time no clear signs to explain patient's eructations. Will provide cushion for antibiotic eyedrops as  preventative measure. Patient given ophthalmology referral.  MDM   1. Eye pain, left      Angus Seller, PA-C 03/05/13 2209

## 2013-03-06 ENCOUNTER — Encounter: Payer: Self-pay | Admitting: Cardiology

## 2013-03-06 ENCOUNTER — Encounter (HOSPITAL_COMMUNITY)
Admission: RE | Admit: 2013-03-06 | Discharge: 2013-03-06 | Disposition: A | Discharge status: Home / Self Care | Payer: 59 | Source: Ambulatory Visit | Attending: Cardiology | Admitting: Cardiology

## 2013-03-06 ENCOUNTER — Encounter: Payer: Self-pay | Admitting: Internal Medicine

## 2013-03-06 ENCOUNTER — Telehealth: Payer: Self-pay | Admitting: *Deleted

## 2013-03-06 ENCOUNTER — Ambulatory Visit (INDEPENDENT_AMBULATORY_CARE_PROVIDER_SITE_OTHER): Payer: 59 | Admitting: Cardiology

## 2013-03-06 ENCOUNTER — Encounter (HOSPITAL_COMMUNITY): Payer: 59

## 2013-03-06 VITALS — BP 108/78 | HR 81 | Ht 65.0 in | Wt 247.0 lb

## 2013-03-06 DIAGNOSIS — I10 Essential (primary) hypertension: Secondary | ICD-10-CM

## 2013-03-06 DIAGNOSIS — O903 Peripartum cardiomyopathy: Secondary | ICD-10-CM

## 2013-03-06 NOTE — Progress Notes (Signed)
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 Echocardiogram in August of 2014 showed an ejection fraction of 35-40% and grade 2 diastolic dysfunction. VQ scan in August of 2014 was very low probability; lower ext doppers neg. TSH in August 2014 1.80; BNP 53. Since I last saw her in September of 2014, her dyspnea is much improved on the higher dose of Lasix. No orthopnea, PND or pedal edema. No chest pain.   Current Outpatient Prescriptions  Medication Sig Dispense Refill  . acetaminophen (TYLENOL) 500 MG tablet Take 500 mg by mouth every 6 (six) hours as needed for pain.      Marland Kitchen amLODipine (NORVASC) 5 MG tablet Take 5 mg by mouth daily.      Marland Kitchen buPROPion (WELLBUTRIN XL) 150 MG 24 hr tablet Take 450 mg by mouth daily.      . carvedilol (COREG) 25 MG tablet Take 1 tablet (25 mg total) by mouth 2 (two) times daily with a meal.  60 tablet  12  . cholecalciferol (VITAMIN D) 1000 UNITS tablet Take 1,000 Units by mouth daily.      . colesevelam (WELCHOL) 625 MG tablet Take 625 mg by mouth daily.       . diazepam (VALIUM) 10 MG tablet Take 10 mg by mouth at bedtime.       . dicyclomine (BENTYL) 10 MG capsule Take 10 mg by mouth 3 (three) times daily as needed (for abdominal cramping).       Marland Kitchen FLUoxetine (PROZAC) 20 MG capsule Take 1 capsule by mouth every morning.      . furosemide (LASIX) 40 MG tablet Take 1 tablet (40 mg total) by mouth 2 (two) times daily.  60 tablet  12  . losartan (COZAAR) 100 MG tablet Take 100 mg by mouth daily.      . ondansetron (ZOFRAN-ODT) 4 MG disintegrating tablet Take 4 mg by mouth every 8 (eight) hours as needed.      . potassium chloride SA (K-DUR,KLOR-CON) 20 MEQ tablet Take 2 tablets (40 mEq total) by mouth 2 (two) times daily.  124 tablet  12  . promethazine (PHENERGAN) 25 MG tablet Take 1 tablet (25 mg total) by mouth every 6 (six) hours as needed for nausea.  30 tablet  0  . tobramycin (TOBREX) 0.3 % ophthalmic  solution Place 1-2 drops into the left eye every 4 (four) hours.  5 mL  0   No current facility-administered medications for this visit.     Past Medical History  Diagnosis Date  . Hypertension   . Ovarian cyst   . OSA (obstructive sleep apnea)     on CPAP   . IBS (irritable bowel syndrome)   . Postpartum cardiomyopathy   . Depression   . Anxiety     Past Surgical History  Procedure Laterality Date  . Abdominal surgery    . Cesarean section    . Cholecystectomy    . Foot surgery    . Tubal ligation  1999    History   Social History  . Marital Status: Married    Spouse Name: N/A    Number of Children: 1  . Years of Education: N/A   Occupational History  . stay at home mom    Social History Main Topics  . Smoking status: Never Smoker   . Smokeless tobacco: Never Used  . Alcohol Use: No  . Drug Use: No  . Sexual Activity: Not  on file   Other Topics Concern  . Not on file   Social History Narrative  . No narrative on file    ROS: no fevers or chills, productive cough, hemoptysis, dysphasia, odynophagia, melena, hematochezia, dysuria, hematuria, rash, seizure activity, orthopnea, PND, pedal edema, claudication. Remaining systems are negative.  Physical Exam: Well-developed well-nourished in no acute distress.  Skin is warm and dry.  HEENT is normal.  Neck is supple.  Chest is clear to auscultation with normal expansion.  Cardiovascular exam is regular rate and rhythm.  Abdominal exam nontender or distended. No masses palpated. Extremities show no edema. neuro grossly intact

## 2013-03-06 NOTE — Telephone Encounter (Signed)
Advice noted -patient seen in the emergency room October 12 for same

## 2013-03-06 NOTE — Telephone Encounter (Signed)
[facility] Triage Call Report Triage Record Num: [id number] Operator: Claudie Leach Patient Name: [name] Call Date & Time: 03/04/2013 11:49:16PM Patient Phone: [phone] PCP: Rene Paci Patient Gender: Female PCP Fax : [phone] Patient DOB: 1975-07-03 Practice Name: [facility] Reason for Call: LMP was 02/22/13. Caller: [name]/Patient; PCP: Rene Paci (Adults only); CB#: [phone]; Call regarding sty to the left lower eyelid outer corner. Started on 03/03/13. States it is more painful today and hurts to blink and seem more swollen. Triaged per UUV:[id number] or Irritation guideline. To see provider within 4 hours due to worsening redness, swelling and tenderness of tissue around eyes associated with restricted or painful eye movements, bulging eye, or decreased vision related to swelling. Care advice given. Advised ED and will go to [facility]. Protocol(s) Used: Eye: Infection or Irritation Recommended Outcome per Protocol: See Provider within 4 hours Reason for Outcome: Worsening redness, swelling AND tenderness of tissue around eyes associated with restricted or painful eye movements, bulging eye, or decreased vision related to swelling Care Advice: ~ Another adult should drive. ~ CAUTIONS Write down provider's name. List or place the following in a bag for transport with the patient: current prescription and/or nonprescription medications; alternative treatments, therapies and medications; and street drugs. ~ 10/

## 2013-03-06 NOTE — Assessment & Plan Note (Signed)
Symptoms are markedly improved on higher dose medications. Continue present dose of Lasix. Continue beta blocker and ARB. Plan repeat echocardiogram when she returns in 6 months to see if her LV function has improved with medical therapy.

## 2013-03-06 NOTE — Assessment & Plan Note (Signed)
Continue present medications. 

## 2013-03-06 NOTE — Patient Instructions (Signed)
Your physician wants you to follow-up in: 6 MONTHS WITH DR CRENSHAW You will receive a reminder letter in the mail two months in advance. If you don't receive a letter, please call our office to schedule the follow-up appointment.  

## 2013-03-08 ENCOUNTER — Encounter (HOSPITAL_COMMUNITY)
Admission: RE | Admit: 2013-03-08 | Discharge: 2013-03-08 | Disposition: A | Discharge status: Home / Self Care | Payer: 59 | Source: Ambulatory Visit | Attending: Cardiology | Admitting: Cardiology

## 2013-03-08 ENCOUNTER — Encounter (HOSPITAL_COMMUNITY): Payer: 59

## 2013-03-10 ENCOUNTER — Encounter (HOSPITAL_COMMUNITY): Payer: 59

## 2013-03-13 ENCOUNTER — Encounter (HOSPITAL_COMMUNITY): Payer: 59

## 2013-03-13 ENCOUNTER — Telehealth (HOSPITAL_COMMUNITY): Payer: Self-pay | Admitting: Internal Medicine

## 2013-03-15 ENCOUNTER — Encounter (HOSPITAL_COMMUNITY): Payer: 59

## 2013-03-15 ENCOUNTER — Encounter (HOSPITAL_COMMUNITY)
Admission: RE | Admit: 2013-03-15 | Discharge: 2013-03-15 | Disposition: A | Discharge status: Home / Self Care | Payer: 59 | Source: Ambulatory Visit | Attending: Cardiology | Admitting: Cardiology

## 2013-03-17 ENCOUNTER — Encounter (HOSPITAL_COMMUNITY): Payer: 59

## 2013-03-20 ENCOUNTER — Encounter (HOSPITAL_COMMUNITY): Payer: 59

## 2013-03-20 ENCOUNTER — Telehealth (HOSPITAL_COMMUNITY): Payer: Self-pay | Admitting: Internal Medicine

## 2013-03-22 ENCOUNTER — Encounter (HOSPITAL_COMMUNITY)
Admission: RE | Admit: 2013-03-22 | Discharge: 2013-03-22 | Disposition: A | Discharge status: Home / Self Care | Payer: 59 | Source: Ambulatory Visit | Attending: Cardiology | Admitting: Cardiology

## 2013-03-22 ENCOUNTER — Encounter (HOSPITAL_COMMUNITY): Payer: 59

## 2013-03-22 NOTE — Progress Notes (Signed)
Pt tearful at cardiac rehab today.  Pt expressed fear of deterioration of her heart disease.  Pt encouraged to continue current regimen, including medication compliance, exercise and risk factor modificationsPt questions answered.  Offered emotional support, comfort, and reassurance.

## 2013-03-22 NOTE — Progress Notes (Signed)
I reviewed [name]'s quality of life questionnaire. [name] has low scores in all areas of her questionnaire except Family. [name] says she has battled with depression since the birth of her daughter.  [name] is on antidepressants and denies being suicidal. I gave [name] information about [facility] a local support group for women living with heart disease. I encouraged  [name] to attend. [name] told me she will be willing to meet with our hospital chaplain. I will set the appointment up on Friday. [name] gave me permission to forward her quality of life questionnaire to her phycologist,  Dr Evelene Croon.  [name] told me she has been having problems with constipation and diarrhea due to her IBS. [name] says she will call her primary care physician.  [name] has been taking Magnesium Citrate as needed. I gave [name] continued emotional support and will continue to monitor her throughout the program.

## 2013-03-24 ENCOUNTER — Encounter (HOSPITAL_COMMUNITY): Payer: 59

## 2013-03-24 ENCOUNTER — Encounter (HOSPITAL_COMMUNITY)
Admission: RE | Admit: 2013-03-24 | Discharge: 2013-03-24 | Disposition: A | Discharge status: Home / Self Care | Payer: 59 | Source: Ambulatory Visit | Attending: Cardiology | Admitting: Cardiology

## 2013-03-24 NOTE — Progress Notes (Signed)
Appointment made for Angie to meet with the hospital chaplain.

## 2013-03-27 ENCOUNTER — Encounter (HOSPITAL_COMMUNITY): Payer: 59

## 2013-03-28 ENCOUNTER — Encounter: Payer: Self-pay | Admitting: Internal Medicine

## 2013-03-28 ENCOUNTER — Ambulatory Visit: Payer: 59 | Admitting: Dietician

## 2013-03-29 ENCOUNTER — Telehealth: Payer: Self-pay | Admitting: Cardiology

## 2013-03-29 ENCOUNTER — Encounter (HOSPITAL_COMMUNITY): Payer: 59

## 2013-03-29 NOTE — Telephone Encounter (Signed)
New problem    Today pt woke up light headed and dizze ,BP 100/60.   Pt would like to know if she should take her med today because of how she feels.   11/4 pt's feet and ankles where swollen, today still a little swollen.

## 2013-03-29 NOTE — Telephone Encounter (Signed)
Discussed with dr Jens Som, Spoke with pt, to stop amlodipine. Patient voiced understanding

## 2013-03-31 ENCOUNTER — Encounter (HOSPITAL_COMMUNITY)
Admission: RE | Admit: 2013-03-31 | Discharge: 2013-03-31 | Disposition: A | Discharge status: Home / Self Care | Payer: 59 | Source: Ambulatory Visit | Attending: Cardiology | Admitting: Cardiology

## 2013-03-31 ENCOUNTER — Encounter (HOSPITAL_COMMUNITY): Payer: 59

## 2013-03-31 DIAGNOSIS — I10 Essential (primary) hypertension: Secondary | ICD-10-CM | POA: Insufficient documentation

## 2013-03-31 DIAGNOSIS — G4733 Obstructive sleep apnea (adult) (pediatric): Secondary | ICD-10-CM | POA: Insufficient documentation

## 2013-03-31 DIAGNOSIS — Z5189 Encounter for other specified aftercare: Secondary | ICD-10-CM | POA: Insufficient documentation

## 2013-04-03 ENCOUNTER — Encounter: Payer: Self-pay | Admitting: Internal Medicine

## 2013-04-03 ENCOUNTER — Ambulatory Visit (INDEPENDENT_AMBULATORY_CARE_PROVIDER_SITE_OTHER): Payer: 59 | Admitting: Internal Medicine

## 2013-04-03 ENCOUNTER — Encounter (HOSPITAL_COMMUNITY): Payer: 59

## 2013-04-03 ENCOUNTER — Encounter (HOSPITAL_COMMUNITY)
Admission: RE | Admit: 2013-04-03 | Discharge: 2013-04-03 | Disposition: A | Discharge status: Home / Self Care | Payer: 59 | Source: Ambulatory Visit | Attending: Cardiology | Admitting: Cardiology

## 2013-04-03 VITALS — BP 110/86 | HR 85 | Temp 99.5°F | Wt 246.2 lb

## 2013-04-03 DIAGNOSIS — K5904 Chronic idiopathic constipation: Secondary | ICD-10-CM

## 2013-04-03 DIAGNOSIS — O903 Peripartum cardiomyopathy: Secondary | ICD-10-CM

## 2013-04-03 DIAGNOSIS — K5909 Other constipation: Secondary | ICD-10-CM

## 2013-04-03 DIAGNOSIS — I10 Essential (primary) hypertension: Secondary | ICD-10-CM

## 2013-04-03 DIAGNOSIS — K589 Irritable bowel syndrome without diarrhea: Secondary | ICD-10-CM

## 2013-04-03 MED ORDER — POLYETHYLENE GLYCOL 3350 17 GM/SCOOP PO POWD: 17.0000 g | Freq: Two times a day (BID) | ORAL | Status: DC | PRN

## 2013-04-03 MED ORDER — LIDOCAINE-HYDROCORTISONE ACE 3-0.5 % RE CREA: 1.0000 | TOPICAL_CREAM | Freq: Two times a day (BID) | RECTAL | Status: DC

## 2013-04-03 NOTE — Assessment & Plan Note (Signed)
BP Readings from Last 3 Encounters:  04/03/13 110/86  03/06/13 108/78  03/05/13 148/97   Resistant to med tx On ARB, amlodipine, coreg, and now lasix since 12/2012 hosp for CHF exac The current medical regimen is effective;  continue present plan and medications.

## 2013-04-03 NOTE — Assessment & Plan Note (Signed)
hosp for exac of same 12/2012 - now on daily lasix No edema or change in chronic dyspnea on exertion Working with cardiology on same - in cardiac rehab On coreg, lasix - low Na diet and BP control

## 2013-04-03 NOTE — Progress Notes (Signed)
Subjective:    Patient ID: [name], female    DOB: 10/10/1975, 37 y.o.   MRN: [id number]  GI Problem The primary symptoms include fatigue (severe) and myalgias. Primary symptoms do not include fever, weight loss, abdominal pain, nausea, vomiting, diarrhea (chronic, severe) or hematochezia.  The illness is also significant for bloating and constipation. The illness does not include anorexia, dysphagia, tenesmus or back pain. Significant associated medical issues include gallstones (hx ERCP for bile leak s/p chole 2008) and irritable bowel syndrome. Associated medical issues do not include inflammatory bowel disease, liver disease or alcohol abuse.    also reviewed chronic medical issues:  CM hx - initially dx postpartum but did not resolve. symptomatic dyspnea on exertion. Recent hospitalization for same summer 2014, now on daily diuretics - no edema or chest pain but dyspnea on exertion with min activity- working with local cards ([facility]) and in cardiac rehab  hypertension -resistant to tx - ARB temp on hold after hospitalization due to ARI, then resumed at lower dose - reports compliance with meds as rx'd  Anxiety/depession - exacerabated by medical illnesses - previously ineffective symptoms control with various SSRI, SNRI and BZs - working with psyc Dr Evelene Croon -on Wellbutrin and Prozac - slow to improve  OSA - compliant with CPAP - following with pulm and working on weight reduction with low carb low Na diet.  Past Medical History  Diagnosis Date  . Hypertension   . Ovarian cyst   . OSA (obstructive sleep apnea)     on CPAP   . IBS (irritable bowel syndrome)   . Postpartum cardiomyopathy   . Depression   . Anxiety     Review of Systems  Constitutional: Positive for activity change (chronic decrease in past 5 years, worse since CHF hosp) and fatigue (severe). Negative for fever, weight loss and unexpected weight change.  Respiratory: Negative for cough and shortness of  breath.   Cardiovascular: Negative for palpitations and leg swelling.  Gastrointestinal: Positive for constipation and bloating. Negative for dysphagia, nausea, vomiting, abdominal pain, diarrhea (chronic, severe), hematochezia and anorexia.  Musculoskeletal: Positive for myalgias. Negative for back pain.  Psychiatric/Behavioral: Negative for self-injury.        Objective:   Physical Exam BP 110/86  Pulse 85  Temp(Src) 99.5 F (37.5 C) (Oral)  Wt 224 lb 6.4 oz (101.787 kg)  SpO2 97% Wt Readings from Last 3 Encounters:  04/03/13 224 lb 6.4 oz (101.787 kg)  03/06/13 247 lb (112.038 kg)  02/15/13 247 lb 1.9 oz (112.093 kg)   Constitutional: She is overweight, but appears well-developed and well-nourished. No distress.  Neck: Thick. Normal range of motion. Neck supple. No JVD present. No thyromegaly present.  Cardiovascular: Normal rate, regular rhythm and normal heart sounds.  No murmur heard. No BLE edema. Pulmonary/Chest: Effort normal and breath sounds normal. No respiratory distress. She has no wheezes.  Abdomen: obese, SNTND, +BS, no mass Psychiatric: She has a mildly anxious/ dysphoric mood and affect. Her behavior is normal. Judgment and thought content normal.   Lab Results  Component Value Date   WBC 9.1 01/01/2013   HGB 13.5 01/01/2013   HCT 39.8 01/01/2013   PLT 288 01/01/2013   GLUCOSE 96 02/13/2013   CHOL 144 02/15/2013   TRIG 83.0 02/15/2013   HDL 43.30 02/15/2013   LDLCALC 84 02/15/2013   ALT 33 01/13/2007   AST 30 01/13/2007   NA 135 02/13/2013   K 4.5 02/13/2013   CL 105 02/13/2013  CREATININE 1.0 02/13/2013   BUN 11 02/13/2013   CO2 25 02/13/2013   TSH 1.80 12/28/2012        Assessment & Plan:   See problem list. Medications and labs reviewed today.  Constipation, chronic. Alternating with severe diarrhea typical of IBS Prior evaluation by gastroenterology Dr. Connye Burkitt reviewed Will treat with scheduled MiraLAX. Encourage adequate hydration and fiber intake. Also  Colace that time for stool softener  advised against regular mag citrate use  Education reassurance provided

## 2013-04-03 NOTE — Patient Instructions (Signed)
It was good to see you today.  We have reviewed your prior records including labs and tests today  Use MiraLAX 1-3 times daily with 8 ounces of fluid as discussed to keep bowels moving Also hydrocortisone with lidocaine for rectal pain and bleeding as needed  Your prescription(s) have been submitted to your pharmacy. Please take as directed and contact our office if you believe you are having problem(s) with the medication(s).  Other medications reviewed, no other changes

## 2013-04-03 NOTE — Assessment & Plan Note (Signed)
Severe alternating diarrhea with constipation symptoms  On welchol prn - no new change in symptoms  Advise Miralax qd- tid prn - erxdone Also stool softener qhs

## 2013-04-03 NOTE — Progress Notes (Signed)
[name] 37 y.o. female Nutrition Note Spoke with pt.  Nutrition Plan and Nutrition Survey goals reviewed with pt. Pt is following Step 2 of the Therapeutic Lifestyle Changes diet. Pt wants to lose wt. Pt wt is down 23 lb over the past 2 months. Rate of wt loss greater than desired rate. Suspect some wt loss may be due to fluid loss given CHF. Pt has been trying to lose wt by "eating low calorie food more frequently." Wt loss tips reviewed. Pt states she [facility]: [facility]. Pt is watching sodium intake closely. Pt reports she was constipated x 2 weeks. Pt saw Dr. Felicity Coyer and was started on Miralax. Fiber and fluid intake discussed as ways to help manage IBS. Pt expressed understanding of the information reviewed. Pt aware of nutrition education classes offered and plans on attending nutrition classes. Wt Readings from Last 3 Encounters:  04/03/13 224 lb 6.4 oz (101.787 kg)  03/06/13 247 lb (112.038 kg)  02/15/13 247 lb 1.9 oz (112.093 kg)   Nutrition Diagnosis   Food-and nutrition-related knowledge deficit related to lack of exposure to information as related to diagnosis of: ? CVD ?    Obesity related to excessive energy intake as evidenced by a BMI of 41.9  Nutrition RX/ Estimated Daily Nutrition Needs for: wt loss  1550-2050 Kcal, 40-55 gm fat, 10-16 gm sat fat, 1.5-2.0 gm trans-fat, <1500 mg sodium   Nutrition Intervention   Pt's individual nutrition plan including cholesterol goals reviewed with pt.   Benefits of adopting Therapeutic Lifestyle Changes discussed when Medficts reviewed.   Pt to attend the Portion Distortion class   Pt to attend the  ? Nutrition I class                     ? Nutrition II class   Pt given handouts for: ? Nutrition and IBS   Continue client-centered nutrition education by RD, as part of interdisciplinary care.  Goal(s)   Pt to identify food quantities necessary to achieve: ? wt loss to a goal wt of 225-243 lb  (102.4-110.6 kg) at graduation from cardiac rehab.   Monitor and Evaluate progress toward nutrition goal with team. Nutrition Risk:  Low   Mickle Plumb, M.Ed, RD, LDN, CDE 04/03/2013 3:34 PM

## 2013-04-03 NOTE — Progress Notes (Signed)
Pre-visit discussion using our clinic review tool. No additional management support is needed unless otherwise documented below in the visit note.  

## 2013-04-05 ENCOUNTER — Encounter: Payer: 59 | Attending: Family Medicine | Admitting: Dietician

## 2013-04-05 ENCOUNTER — Encounter: Payer: Self-pay | Admitting: Internal Medicine

## 2013-04-05 ENCOUNTER — Encounter (HOSPITAL_COMMUNITY): Payer: 59

## 2013-04-05 VITALS — Ht 65.0 in | Wt 246.9 lb

## 2013-04-05 DIAGNOSIS — E669 Obesity, unspecified: Secondary | ICD-10-CM | POA: Insufficient documentation

## 2013-04-05 DIAGNOSIS — Z713 Dietary counseling and surveillance: Secondary | ICD-10-CM | POA: Insufficient documentation

## 2013-04-05 NOTE — Patient Instructions (Addendum)
Continue eating 3 meals per day and add 2-3 snacks with protein.  Eat snacks or meals every 3-5 hours.  Continue having vegetables with meals and limiting starches to a quarter of your plate.  Continue checking food labels or calorie Brooke Dare app to check for sodium content in foods.  Aim to have less than 300 mg of sodium per serving.  Look for heart healthy fats: avocados, nuts, seeds, olive oil, sesame oil, fish, and canola.  Continue getting physical activity with a goal of 30 minute per day 5 days per week.  Instead of drinking ginger ale, try ginger tea or plain seltzer with a small amount of juice.  Try having hot lemon water (with or without ground flax seeds) or tea first thing in the morning before you eat breakfast to help with constipation.

## 2013-04-05 NOTE — Progress Notes (Signed)
  Medical Nutrition Therapy:  Appt start time: 1015 end time:  1045.  Assessment:  Primary concerns today: [name] returns for a follow up today. Lost 4 lbs since her last visit and feels like jeans are looser. States she has had problems with IBS which caused her to be constipated. Starting taking Miralax yesterday instead of magnesium citrate and constipation is not resolved yet. Some of medications and being constipated is causing her to feel nauseas and lose her appetite. Will skip about 1 meal per week.     Has been going to Cardiac Rehab since late September.  MEDICATIONS: see list, Miralax    DIETARY INTAKE:  24-hr recall:  B ( AM): 100 calorie cottage cheese with 1/4 cup granola OR [place] yogurt with granola Or oatmeal with raisins and cinnamon OR oranges/banana and almonds with water Snk (11 AM): dried apples L ( PM): 1/2 [place] or peanut butter sandwich OR and low salt crackers OR leftover dinner such as salmon with brussels sprouts and rice (1/2 cup)  with water  Snk ( PM): none D ( PM): chicken breast, zucchini, angel hair pasta with water  Snk ( PM): fruit cup OR cottage cheese/yogurt Beverages: water or ginger ale  Usual physical activity:Cardiac Rehab 2-3 x week for 30-45 minutes  Estimated energy needs: 1800 calories 200 g carbohydrates 135 g protein 50 g fat  Progress Towards Goal(s):  In progress.   Nutritional Diagnosis:  NB-1.1 Food and nutrition-related knowledge deficit As related to history of large portions and excess sodium intake.  As evidenced by BMI of 41.7 and doctor recommendation to lose weight.    Intervention:  Nutrition counseling provided. Encouraged [name] to continue with the diet changes she has made, keep exercising, and recommended that she drink mostly water instead of ginger ale to help limit calories.   Plan: Continue eating 3 meals per day and add 2-3 snacks with protein.  Eat snacks or meals every 3-5 hours.  Continue having vegetables  with meals and limiting starches to a quarter of your plate.  Continue checking food labels or calorie Brooke Dare app to check for sodium content in foods.  Aim to have less than 300 mg of sodium per serving.  Look for heart healthy fats: avocados, nuts, seeds, olive oil, sesame oil, fish, and canola.  Continue getting physical activity with a goal of 30 minute per day 5 days per week.  Instead of drinking ginger ale, try ginger tea or plain seltzer with a small amount of juice.  Try having hot lemon water (with or without ground flax seeds) or tea first thing in the morning before you eat breakfast to help with constipation.   Monitoring/Evaluation:  Dietary intake, exercise, and body weight in 2 month(s).

## 2013-04-07 ENCOUNTER — Encounter (HOSPITAL_COMMUNITY): Payer: 59

## 2013-04-07 ENCOUNTER — Encounter (HOSPITAL_COMMUNITY)
Admission: RE | Admit: 2013-04-07 | Discharge: 2013-04-07 | Disposition: A | Discharge status: Home / Self Care | Payer: 59 | Source: Ambulatory Visit | Attending: Cardiology | Admitting: Cardiology

## 2013-04-10 ENCOUNTER — Encounter (HOSPITAL_COMMUNITY): Payer: 59

## 2013-04-12 ENCOUNTER — Encounter (HOSPITAL_COMMUNITY): Payer: 59

## 2013-04-14 ENCOUNTER — Encounter (HOSPITAL_COMMUNITY)
Admission: RE | Admit: 2013-04-14 | Discharge: 2013-04-14 | Disposition: A | Discharge status: Home / Self Care | Payer: 59 | Source: Ambulatory Visit | Attending: Cardiology | Admitting: Cardiology

## 2013-04-14 ENCOUNTER — Encounter (HOSPITAL_COMMUNITY): Payer: 59

## 2013-04-17 ENCOUNTER — Encounter (HOSPITAL_COMMUNITY): Payer: 59

## 2013-04-19 ENCOUNTER — Encounter (HOSPITAL_COMMUNITY): Payer: 59

## 2013-04-21 ENCOUNTER — Encounter (HOSPITAL_COMMUNITY): Payer: 59

## 2013-04-24 ENCOUNTER — Encounter (HOSPITAL_COMMUNITY): Payer: 59

## 2013-04-26 ENCOUNTER — Encounter (HOSPITAL_COMMUNITY): Payer: 59

## 2013-04-27 ENCOUNTER — Telehealth (HOSPITAL_COMMUNITY): Payer: Self-pay | Admitting: *Deleted

## 2013-04-28 ENCOUNTER — Encounter (HOSPITAL_COMMUNITY): Payer: 59

## 2013-05-01 ENCOUNTER — Encounter (HOSPITAL_COMMUNITY): Payer: 59

## 2013-05-03 ENCOUNTER — Encounter (HOSPITAL_COMMUNITY): Payer: 59

## 2013-05-04 ENCOUNTER — Ambulatory Visit (INDEPENDENT_AMBULATORY_CARE_PROVIDER_SITE_OTHER): Payer: 59 | Admitting: Internal Medicine

## 2013-05-04 ENCOUNTER — Encounter: Payer: Self-pay | Admitting: Internal Medicine

## 2013-05-04 VITALS — BP 110/84 | HR 117 | Temp 97.8°F | Resp 16 | Ht 65.0 in | Wt 244.0 lb

## 2013-05-04 DIAGNOSIS — H6691 Otitis media, unspecified, right ear: Secondary | ICD-10-CM

## 2013-05-04 DIAGNOSIS — H6690 Otitis media, unspecified, unspecified ear: Secondary | ICD-10-CM

## 2013-05-04 MED ORDER — PROMETHAZINE-CODEINE 6.25-10 MG/5ML PO SYRP: 5.0000 mL | ORAL_SOLUTION | Freq: Four times a day (QID) | ORAL | Status: DC | PRN

## 2013-05-04 MED ORDER — AMOXICILLIN 875 MG PO TABS: 875.0000 mg | ORAL_TABLET | Freq: Two times a day (BID) | ORAL | Status: DC

## 2013-05-04 NOTE — Progress Notes (Signed)
Subjective:    Patient ID: [name], female    DOB: June 08, 1975, 37 y.o.   MRN: [id number]  HPI Ms. Kuhnert starting 11 days ago with myalgias, fever to 102,cough- dry, sore throat, sinus congestion and drainage. Her symptoms improved but over the past 24 hours her symptoms seem to be recurring. No sick contacts. No diarrhea, no dysuria.  Past Medical History  Diagnosis Date  . Hypertension   . Ovarian cyst   . OSA (obstructive sleep apnea)     on CPAP   . IBS (irritable bowel syndrome)   . Postpartum cardiomyopathy   . Depression   . Anxiety    Past Surgical History  Procedure Laterality Date  . Abdominal surgery    . Cesarean section    . Cholecystectomy    . Foot surgery    . Tubal ligation  1999   Family History  Problem Relation Age of Onset  . Emphysema Maternal Grandmother     smoked  . Allergies Daughter   . Heart disease Maternal Grandmother 45    MI  . Rheum arthritis Mother    History   Social History  . Marital Status: Married    Spouse Name: N/A    Number of Children: 1  . Years of Education: N/A   Occupational History  . stay at [name]    Social History Main Topics  . Smoking status: Never Smoker   . Smokeless tobacco: Never Used  . Alcohol Use: No  . Drug Use: No  . Sexual Activity: Not on file   Other Topics Concern  . Not on file   Social History Narrative  . No narrative on file     Current Outpatient Prescriptions on File Prior to Visit  Medication Sig Dispense Refill  . acetaminophen (TYLENOL) 500 MG tablet Take 500 mg by mouth every 6 (six) hours as needed for pain.      . Armodafinil (NUVIGIL) 250 MG tablet Take 250 mg by mouth daily.      Marland Kitchen buPROPion (WELLBUTRIN XL) 150 MG 24 hr tablet Take 450 mg by mouth daily.      . carvedilol (COREG) 25 MG tablet Take 1 tablet (25 mg total) by mouth 2 (two) times daily with a meal.  60 tablet  12  . cholecalciferol (VITAMIN D) 1000 UNITS tablet Take 1,000 Units by mouth daily.        . colesevelam (WELCHOL) 625 MG tablet Take 625 mg by mouth daily.       . diazepam (VALIUM) 10 MG tablet Take 10 mg by mouth at bedtime.       . dicyclomine (BENTYL) 10 MG capsule Take 10 mg by mouth 3 (three) times daily as needed (for abdominal cramping).       Marland Kitchen FLUoxetine (PROZAC) 20 MG capsule Take 1 capsule by mouth every morning.      . furosemide (LASIX) 40 MG tablet Take 1 tablet (40 mg total) by mouth 2 (two) times daily.  60 tablet  12  . lidocaine-hydrocortisone (ANAMANTEL HC) 3-0.5 % CREA Place 1 Applicatorful rectally 2 (two) times daily.  30 g  0  . losartan (COZAAR) 100 MG tablet Take 100 mg by mouth daily.      . magnesium citrate SOLN Take 1 Bottle by mouth as needed.      . ondansetron (ZOFRAN-ODT) 4 MG disintegrating tablet Take 4 mg by mouth every 8 (eight) hours as needed.      Marland Kitchen  polyethylene glycol powder (GLYCOLAX/MIRALAX) powder Take 17 g by mouth 2 (two) times daily as needed.  3350 g  1  . potassium chloride SA (K-DUR,KLOR-CON) 20 MEQ tablet Take 2 tablets (40 mEq total) by mouth 2 (two) times daily.  124 tablet  12  . promethazine (PHENERGAN) 25 MG tablet Take 1 tablet (25 mg total) by mouth every 6 (six) hours as needed for nausea.  30 tablet  0  . tobramycin (TOBREX) 0.3 % ophthalmic solution Place 1-2 drops into the left eye every 4 (four) hours.  5 mL  0   No current facility-administered medications on file prior to visit.      Review of Systems System review is negative for any constitutional, cardiac, pulmonary, GI or neuro symptoms or complaints other than as described in the HPI.     Objective:   Physical Exam Filed Vitals:   05/04/13 1644  BP: 110/84  Pulse: 117  Temp: 97.8 F (36.6 C)  Resp: 16   Wt Readings from Last 3 Encounters:  05/04/13 244 lb (110.678 kg)  04/05/13 246 lb 14.4 oz (111.993 kg)  04/05/13 246 lb 3.2 oz (111.676 kg)   Gen'l- overweight woman in no acute distress HEENT - right TM retracted and rosey, throat w/o erythema  or exudate Nodes - negative Cor - 2+ radial pulse RRR Pulm - CTAP, no increased work of breathing Neuro - awake and alert.       Assessment & Plan:  URI/OM - doubt influenza at this time.  Plan Amoxicillin 875 mg twice a day for 7 days  PHenergan with codeine 1 tsp every 6 hours for cough  Hydrate  Tylenol 500 mg 1 or 2 tabs three times a day as needed for fever or aches  CALL if you spike a fever to more than 101 F

## 2013-05-04 NOTE — Patient Instructions (Signed)
URI/OM - doubt influenza at this time.  Plan Amoxicillin 875 mg twice a day for 7 days  PHenergan with codeine 1 tsp every 6 hours for cough  Hydrate  Tylenol 500 mg 1 or 2 tabs three times a day as needed for fever or aches  CALL if you spike a fever to more than 101 F  Otitis Media, Adult A middle ear infection is an infection in the space behind the eardrum. The medical name for this is "otitis media." It may happen after a common cold. It is caused by a germ that starts growing in that space. You may feel swollen glands in your neck on the side of the ear infection. HOME CARE INSTRUCTIONS   Take your medicine as directed until it is gone, even if you feel better after the first few days.  Only take over-the-counter or prescription medicines for pain, discomfort, or fever as directed by your caregiver.  Occasional use of a nasal decongestant a couple times per day may help with discomfort and help the eustachian tube to drain better. Follow up with your caregiver in 10 to 14 days or as directed, to be certain that the infection has cleared. Not keeping the appointment could result in a chronic or permanent injury, pain, hearing loss and disability. If there is any problem keeping the appointment, you must call back to this facility for assistance. SEEK IMMEDIATE MEDICAL CARE IF:   You are not getting better in 2 to 3 days.  You have pain that is not controlled with medication.  You feel worse instead of better.  You cannot use the medication as directed.  You develop swelling, redness or pain around the ear or stiffness in your neck. MAKE SURE YOU:   Understand these instructions.  Will watch your condition.  Will get help right away if you are not doing well or get worse. Document Released: 02/14/2004 Document Revised: 08/03/2011 Document Reviewed: 12/06/2012 [facility] Patient Information 2014 [place], [place].

## 2013-05-05 ENCOUNTER — Encounter (HOSPITAL_COMMUNITY): Payer: 59

## 2013-05-05 ENCOUNTER — Encounter: Payer: Self-pay | Admitting: Internal Medicine

## 2013-05-08 ENCOUNTER — Encounter (HOSPITAL_COMMUNITY): Payer: 59

## 2013-05-08 MED ORDER — OSELTAMIVIR PHOSPHATE 75 MG PO CAPS: 75.0000 mg | ORAL_CAPSULE | Freq: Two times a day (BID) | ORAL | Status: DC

## 2013-05-10 ENCOUNTER — Encounter (HOSPITAL_COMMUNITY): Payer: 59

## 2013-05-10 ENCOUNTER — Encounter (HOSPITAL_COMMUNITY): Payer: Self-pay | Admitting: Emergency Medicine

## 2013-05-10 ENCOUNTER — Emergency Department (HOSPITAL_COMMUNITY)
Admission: EM | Admit: 2013-05-10 | Discharge: 2013-05-10 | Disposition: A | Discharge status: Home / Self Care | Payer: 59 | Attending: Emergency Medicine | Admitting: Emergency Medicine

## 2013-05-10 ENCOUNTER — Encounter (HOSPITAL_COMMUNITY): Admission: RE | Admit: 2013-05-10 | Payer: 59 | Source: Ambulatory Visit

## 2013-05-10 ENCOUNTER — Encounter: Payer: Self-pay | Admitting: Internal Medicine

## 2013-05-10 DIAGNOSIS — F329 Major depressive disorder, single episode, unspecified: Secondary | ICD-10-CM | POA: Insufficient documentation

## 2013-05-10 DIAGNOSIS — Z79899 Other long term (current) drug therapy: Secondary | ICD-10-CM | POA: Insufficient documentation

## 2013-05-10 DIAGNOSIS — Z792 Long term (current) use of antibiotics: Secondary | ICD-10-CM | POA: Insufficient documentation

## 2013-05-10 DIAGNOSIS — H60399 Other infective otitis externa, unspecified ear: Secondary | ICD-10-CM | POA: Insufficient documentation

## 2013-05-10 DIAGNOSIS — G4733 Obstructive sleep apnea (adult) (pediatric): Secondary | ICD-10-CM | POA: Insufficient documentation

## 2013-05-10 DIAGNOSIS — J3489 Other specified disorders of nose and nasal sinuses: Secondary | ICD-10-CM | POA: Insufficient documentation

## 2013-05-10 DIAGNOSIS — Y929 Unspecified place or not applicable: Secondary | ICD-10-CM | POA: Insufficient documentation

## 2013-05-10 DIAGNOSIS — X58XXXA Exposure to other specified factors, initial encounter: Secondary | ICD-10-CM | POA: Insufficient documentation

## 2013-05-10 DIAGNOSIS — K589 Irritable bowel syndrome without diarrhea: Secondary | ICD-10-CM | POA: Insufficient documentation

## 2013-05-10 DIAGNOSIS — IMO0002 Reserved for concepts with insufficient information to code with codable children: Secondary | ICD-10-CM | POA: Insufficient documentation

## 2013-05-10 DIAGNOSIS — H6691 Otitis media, unspecified, right ear: Secondary | ICD-10-CM

## 2013-05-10 DIAGNOSIS — R110 Nausea: Secondary | ICD-10-CM | POA: Insufficient documentation

## 2013-05-10 DIAGNOSIS — Z8742 Personal history of other diseases of the female genital tract: Secondary | ICD-10-CM | POA: Insufficient documentation

## 2013-05-10 DIAGNOSIS — F3289 Other specified depressive episodes: Secondary | ICD-10-CM | POA: Insufficient documentation

## 2013-05-10 DIAGNOSIS — T148XXA Other injury of unspecified body region, initial encounter: Secondary | ICD-10-CM

## 2013-05-10 DIAGNOSIS — M25519 Pain in unspecified shoulder: Secondary | ICD-10-CM | POA: Insufficient documentation

## 2013-05-10 DIAGNOSIS — I10 Essential (primary) hypertension: Secondary | ICD-10-CM | POA: Insufficient documentation

## 2013-05-10 DIAGNOSIS — Y939 Activity, unspecified: Secondary | ICD-10-CM | POA: Insufficient documentation

## 2013-05-10 DIAGNOSIS — R42 Dizziness and giddiness: Secondary | ICD-10-CM

## 2013-05-10 DIAGNOSIS — F411 Generalized anxiety disorder: Secondary | ICD-10-CM | POA: Insufficient documentation

## 2013-05-10 DIAGNOSIS — M79609 Pain in unspecified limb: Secondary | ICD-10-CM | POA: Insufficient documentation

## 2013-05-10 MED ORDER — TRAMADOL HCL 50 MG PO TABS
50.0000 mg | ORAL_TABLET | Freq: Once | ORAL | Status: AC
  Administered 2013-05-10: 50 mg via ORAL
  Filled 2013-05-10: qty 1

## 2013-05-10 MED ORDER — METHOCARBAMOL 500 MG PO TABS
500.0000 mg | ORAL_TABLET | Freq: Once | ORAL | Status: AC
  Administered 2013-05-10: 500 mg via ORAL
  Filled 2013-05-10: qty 1

## 2013-05-10 MED ORDER — MECLIZINE HCL 25 MG PO TABS
25.0000 mg | ORAL_TABLET | Freq: Once | ORAL | Status: AC
  Administered 2013-05-10: 25 mg via ORAL
  Filled 2013-05-10: qty 1

## 2013-05-10 MED ORDER — METHOCARBAMOL 500 MG PO TABS: 500.0000 mg | ORAL_TABLET | Freq: Three times a day (TID) | ORAL | Status: DC | PRN

## 2013-05-10 MED ORDER — TRAMADOL HCL 50 MG PO TABS: 50.0000 mg | ORAL_TABLET | Freq: Four times a day (QID) | ORAL | Status: DC | PRN

## 2013-05-10 MED ORDER — MECLIZINE HCL 25 MG PO TABS: 25.0000 mg | ORAL_TABLET | Freq: Three times a day (TID) | ORAL | Status: DC | PRN

## 2013-05-10 NOTE — ED Provider Notes (Signed)
CSN: [id number]     Arrival date & time 05/10/13  0302 History   First MD Initiated Contact with Patient 05/10/13 [phone]     Chief Complaint  Patient presents with  . Dizziness  . Arm Pain   (Consider location/radiation/quality/duration/timing/severity/associated sxs/prior Treatment) HPI Patient was recently diagnosed with a right ear infection and started on amoxicillin by her primary care doctor. She has ongoing tinnitus in the ear. She states that has been getting worse. She then began having room spinning sensation this evening. He was worse when she sat up or turns her head. It was associated with nausea. She's had no fevers or chills. She's had no focal weakness. She's had no visual changes. Patient also has complained of right trapezius and shoulder pain. It is worse with movement or palpation. She has no known trauma. No swelling or deformity. Past Medical History  Diagnosis Date  . Hypertension   . Ovarian cyst   . OSA (obstructive sleep apnea)     on CPAP   . IBS (irritable bowel syndrome)   . Postpartum cardiomyopathy   . Depression   . Anxiety    Past Surgical History  Procedure Laterality Date  . Abdominal surgery    . Cesarean section    . Cholecystectomy    . Foot surgery    . Tubal ligation  1999   Family History  Problem Relation Age of Onset  . Emphysema Maternal Grandmother     smoked  . Allergies Daughter   . Heart disease Maternal Grandmother 45    MI  . Rheum arthritis Mother    History  Substance Use Topics  . Smoking status: Never Smoker   . Smokeless tobacco: Never Used  . Alcohol Use: No   OB History   Grav Para Term Preterm Abortions TAB SAB Ect Mult Living   2 1        1      Review of Systems  Constitutional: Negative for fever and chills.  HENT: Positive for ear pain and rhinorrhea. Negative for sore throat, tinnitus, trouble swallowing and voice change.   Eyes: Negative for photophobia and visual disturbance.  Respiratory: Negative  for cough, shortness of breath and wheezing.   Cardiovascular: Negative for chest pain, palpitations and leg swelling.  Gastrointestinal: Positive for nausea. Negative for vomiting, abdominal pain and diarrhea.  Musculoskeletal: Positive for myalgias. Negative for back pain, neck pain and neck stiffness.  Skin: Negative for rash and wound.  Neurological: Positive for dizziness. Negative for syncope, weakness, light-headedness, numbness and headaches.  All other systems reviewed and are negative.    Allergies  Ivp dye; Vancomycin; Aspirin; Ciprofloxacin; and Sulfa antibiotics  Home Medications   Current Outpatient Rx  Name  Route  Sig  Dispense  Refill  . acetaminophen (TYLENOL) 500 MG tablet   Oral   Take 500 mg by mouth every 6 (six) hours as needed for pain.         Marland Kitchen amoxicillin (AMOXIL) 875 MG tablet   Oral   Take 1 tablet (875 mg total) by mouth 2 (two) times daily.   14 tablet   0   . Armodafinil (NUVIGIL) 250 MG tablet   Oral   Take 250 mg by mouth daily.         Marland Kitchen buPROPion (WELLBUTRIN XL) 150 MG 24 hr tablet   Oral   Take 450 mg by mouth daily.         . carvedilol (COREG) 25 MG tablet  Oral   Take 1 tablet (25 mg total) by mouth 2 (two) times daily with a meal.   60 tablet   12   . cholecalciferol (VITAMIN D) 1000 UNITS tablet   Oral   Take 1,000 Units by mouth daily.         . colesevelam (WELCHOL) 625 MG tablet   Oral   Take 625 mg by mouth daily.          . diazepam (VALIUM) 10 MG tablet   Oral   Take 10 mg by mouth at bedtime.          . dicyclomine (BENTYL) 10 MG capsule   Oral   Take 10 mg by mouth 3 (three) times daily as needed (for abdominal cramping).          Marland Kitchen FLUoxetine (PROZAC) 20 MG capsule   Oral   Take 1 capsule by mouth every morning.         . furosemide (LASIX) 40 MG tablet   Oral   Take 1 tablet (40 mg total) by mouth 2 (two) times daily.   60 tablet   12   . lidocaine-hydrocortisone (ANAMANTEL HC)  3-0.5 % CREA   Rectal   Place 1 Applicatorful rectally 2 (two) times daily.   30 g   0   . losartan (COZAAR) 100 MG tablet   Oral   Take 100 mg by mouth daily.         . ondansetron (ZOFRAN-ODT) 4 MG disintegrating tablet   Oral   Take 4 mg by mouth every 8 (eight) hours as needed.         . polyethylene glycol powder (GLYCOLAX/MIRALAX) powder   Oral   Take 17 g by mouth 2 (two) times daily as needed.   3350 g   1   . potassium chloride SA (K-DUR,KLOR-CON) 20 MEQ tablet   Oral   Take 2 tablets (40 mEq total) by mouth 2 (two) times daily.   124 tablet   12   . promethazine (PHENERGAN) 25 MG tablet   Oral   Take 1 tablet (25 mg total) by mouth every 6 (six) hours as needed for nausea.   30 tablet   0   . promethazine-codeine (PHENERGAN WITH CODEINE) 6.25-10 MG/5ML syrup   Oral   Take 5 mLs by mouth every 6 (six) hours as needed for cough.   180 mL   0   . tobramycin (TOBREX) 0.3 % ophthalmic solution   Left Eye   Place 1-2 drops into the left eye every 4 (four) hours.   5 mL   0    BP 117/70  Pulse 94  Temp(Src) 97.6 F (36.4 C) (Oral)  Resp 18  SpO2 96% Physical Exam  Nursing note and vitals reviewed. Constitutional: She is oriented to person, place, and time. She appears well-developed and well-nourished. No distress.  HENT:  Head: Normocephalic and atraumatic.  Mouth/Throat: Oropharynx is clear and moist. No oropharyngeal exudate.  Right TM is bulging and mildly erythematous. No sinus tenderness.  Eyes: EOM are normal. Pupils are equal, round, and reactive to light.  No nystagmus.  Neck: Normal range of motion. Neck supple.  No meningismus. No posterior midline tenderness.  Cardiovascular: Normal rate and regular rhythm.  Exam reveals no gallop and no friction rub.   No murmur heard. Pulmonary/Chest: Effort normal and breath sounds normal. No respiratory distress. She has no wheezes. She has no rales. She exhibits no tenderness.  Abdominal:  Soft.  Bowel sounds are normal. She exhibits no distension and no mass. There is no tenderness. There is no rebound and no guarding.  Musculoskeletal: Normal range of motion. She exhibits no edema and no tenderness.  No calf swelling or tenderness. Patient has tenderness to palpation with muscle spasm of the right trapezius and right deltoid. She has full range of motion of the right shoulder. Her distal pulses are equal and intact.  Neurological: She is alert and oriented to person, place, and time.  Patient is alert and oriented x3 with clear, goal oriented speech. Patient has 5/5 motor in all extremities. Sensation is intact to light touch.  Skin: Skin is warm and dry. No rash noted. No erythema.  Psychiatric:  Patient is anxious    ED Course  Procedures (including critical care time) Labs Review Labs Reviewed - No data to display Imaging Review No results found.  EKG Interpretation   None       MDM  Patient with vertigo likely due to right middle ear infection. She has a normal neurologic exam. Advised for her to continue antibiotics and we'll treat with meclizine. Patient is to follow with her primary MD to assure resolution of her symptoms.  I believe the patient's right trapezius and right deltoid pain unrelated to her infectious process. It appears she has muscle spasms likely from strain. She has no neurologic deficits. She has equal pulses. We'll treat symptomatically. Return precautions have been given and patient voiced understanding. All questions have been answered.   Loren Racer, MD 05/10/13 0400

## 2013-05-10 NOTE — ED Notes (Signed)
Pt states that she started having dizziness around 1800 last night.  Around 0200 this morning she started having right arm pain and loud ringing in her ears.

## 2013-05-12 ENCOUNTER — Encounter (HOSPITAL_COMMUNITY): Payer: 59

## 2013-05-12 ENCOUNTER — Encounter: Payer: Self-pay | Admitting: Internal Medicine

## 2013-05-12 ENCOUNTER — Ambulatory Visit (INDEPENDENT_AMBULATORY_CARE_PROVIDER_SITE_OTHER): Payer: 59 | Admitting: Internal Medicine

## 2013-05-12 VITALS — BP 130/92 | HR 102 | Temp 98.6°F | Wt 244.8 lb

## 2013-05-12 DIAGNOSIS — H9209 Otalgia, unspecified ear: Secondary | ICD-10-CM

## 2013-05-12 DIAGNOSIS — H9201 Otalgia, right ear: Secondary | ICD-10-CM

## 2013-05-12 DIAGNOSIS — M7541 Impingement syndrome of right shoulder: Secondary | ICD-10-CM

## 2013-05-12 MED ORDER — CYCLOBENZAPRINE HCL 5 MG PO TABS: 5.0000 mg | ORAL_TABLET | Freq: Three times a day (TID) | ORAL | Status: DC | PRN

## 2013-05-12 MED ORDER — PREDNISONE (PAK) 10 MG PO TABS: ORAL_TABLET | ORAL | Status: DC

## 2013-05-12 MED ORDER — PROMETHAZINE-CODEINE 6.25-10 MG/5ML PO SYRP: 5.0000 mL | ORAL_SOLUTION | Freq: Four times a day (QID) | ORAL | Status: DC | PRN

## 2013-05-12 NOTE — Patient Instructions (Addendum)
It was good to see you today.  We have reviewed your prior records including labs and tests today  For your shoulder, will schedule appointment for you to see Dr. Katrinka Blazing. Also refer to physical therapy. My office will arrange these appointments for you  Use prednisone taper over next 6 days to help inflammation Change muscle relaxant to Flexeril, use as needed for shoulder and neck muscle spasm Codeine syrup as needed for pain or cough  Prescriptions given to you or sent to your pharmacy. Please use as directed and call if problems  Impingement Syndrome, Rotator Cuff, Bursitis with Rehab Impingement syndrome is a condition that involves inflammation of the tendons of the rotator cuff and the subacromial bursa, that causes pain in the shoulder. The rotator cuff consists of four tendons and muscles that control much of the shoulder and upper arm function. The subacromial bursa is a fluid filled sac that helps reduce friction between the rotator cuff and one of the bones of the shoulder (acromion). Impingement syndrome is usually an overuse injury that causes swelling of the bursa (bursitis), swelling of the tendon (tendonitis), and/or a tear of the tendon (strain). Strains are classified into three categories. Grade 1 strains cause pain, but the tendon is not lengthened. Grade 2 strains include a lengthened ligament, due to the ligament being stretched or partially ruptured. With grade 2 strains there is still function, although the function may be decreased. Grade 3 strains include a complete tear of the tendon or muscle, and function is usually impaired. SYMPTOMS   Pain around the shoulder, often at the outer portion of the upper arm.  Pain that gets worse with shoulder function, especially when reaching overhead or lifting.  Sometimes, aching when not using the arm.  Pain that wakes you up at night.  Sometimes, tenderness, swelling, warmth, or redness over the affected area.  Loss of  strength.  Limited motion of the shoulder, especially reaching behind the back (to the back pocket or to unhook bra) or across your body.  Crackling sound (crepitation) when moving the arm.  Biceps tendon pain and inflammation (in the front of the shoulder). Worse when bending the elbow or lifting. CAUSES  Impingement syndrome is often an overuse injury, in which chronic (repetitive) motions cause the tendons or bursa to become inflamed. A strain occurs when a force is paced on the tendon or muscle that is greater than it can withstand. Common mechanisms of injury include: Stress from sudden increase in duration, frequency, or intensity of training.  Direct hit (trauma) to the shoulder.  Aging, erosion of the tendon with normal use.  Bony bump on shoulder (acromial spur). RISK INCREASES WITH:  Contact sports (football, wrestling, boxing).  Throwing sports (baseball, tennis, volleyball).  Weightlifting and bodybuilding.  Heavy labor.  Previous injury to the rotator cuff, including impingement.  Poor shoulder strength and flexibility.  Failure to warm up properly before activity.  Inadequate protective equipment.  Old age.  Bony bump on shoulder (acromial spur). PREVENTION   Warm up and stretch properly before activity.  Allow for adequate recovery between workouts.  Maintain physical fitness:  Strength, flexibility, and endurance.  Cardiovascular fitness.  Learn and use proper exercise technique. PROGNOSIS  If treated properly, impingement syndrome usually goes away within 6 weeks. Sometimes surgery is required.  RELATED COMPLICATIONS   Longer healing time if not properly treated, or if not given enough time to heal.  Recurring symptoms, that result in a chronic condition.  Shoulder stiffness,  frozen shoulder, or loss of motion.  Rotator cuff tendon tear.  Recurring symptoms, especially if activity is resumed too soon, with overuse, with a direct blow, or  when using poor technique. TREATMENT  Treatment first involves the use of ice and medicine, to reduce pain and inflammation. The use of strengthening and stretching exercises may help reduce pain with activity. These exercises may be performed at home or with a therapist. If non-surgical treatment is unsuccessful after more than 6 months, surgery may be advised. After surgery and rehabilitation, activity is usually possible in 3 months.  MEDICATION  If pain medicine is needed, nonsteroidal anti-inflammatory medicines (aspirin and ibuprofen), or other minor pain relievers (acetaminophen), are often advised.  Do not take pain medicine for 7 days before surgery.  Prescription pain relievers may be given, if your caregiver thinks they are needed. Use only as directed and only as much as you need.  Corticosteroid injections may be given by your caregiver. These injections should be reserved for the most serious cases, because they may only be given a certain number of times. HEAT AND COLD  Cold treatment (icing) should be applied for 10 to 15 minutes every 2 to 3 hours for inflammation and pain, and immediately after activity that aggravates your symptoms. Use ice packs or an ice massage.  Heat treatment may be used before performing stretching and strengthening activities prescribed by your caregiver, physical therapist, or athletic trainer. Use a heat pack or a warm water soak. SEEK MEDICAL CARE IF:   Symptoms get worse or do not improve in 4 to 6 weeks, despite treatment.  New, unexplained symptoms develop. (Drugs used in treatment may produce side effects.) EXERCISES  RANGE OF MOTION (ROM) AND STRETCHING EXERCISES - Impingement Syndrome (Rotator Cuff  Tendinitis, Bursitis) These exercises may help you when beginning to rehabilitate your injury. Your symptoms may go away with or without further involvement from your physician, physical therapist or athletic trainer. While completing these  exercises, remember:   Restoring tissue flexibility helps normal motion to return to the joints. This allows healthier, less painful movement and activity.  An effective stretch should be held for at least 30 seconds.  A stretch should never be painful. You should only feel a gentle lengthening or release in the stretched tissue. STRETCH  Flexion, Standing  Stand with good posture. With an underhand grip on your right / left hand, and an overhand grip on the opposite hand, grasp a broomstick or cane so that your hands are a little more than shoulder width apart.  Keeping your right / left elbow straight and shoulder muscles relaxed, push the stick with your opposite hand, to raise your right / left arm in front of your body and then overhead. Raise your arm until you feel a stretch in your right / left shoulder, but before you have increased shoulder pain.  Try to avoid shrugging your right / left shoulder as your arm rises, by keeping your shoulder blade tucked down and toward your mid-back spine. Hold for __________ seconds.  Slowly return to the starting position. Repeat __________ times. Complete this exercise __________ times per day. STRETCH  Abduction, Supine  Lie on your back. With an underhand grip on your right / left hand and an overhand grip on the opposite hand, grasp a broomstick or cane so that your hands are a little more than shoulder width apart.  Keeping your right / left elbow straight and your shoulder muscles relaxed, push the stick  with your opposite hand, to raise your right / left arm out to the side of your body and then overhead. Raise your arm until you feel a stretch in your right / left shoulder, but before you have increased shoulder pain.  Try to avoid shrugging your right / left shoulder as your arm rises, by keeping your shoulder blade tucked down and toward your mid-back spine. Hold for __________ seconds.  Slowly return to the starting position. Repeat  __________ times. Complete this exercise __________ times per day. ROM  Flexion, Active-Assisted  Lie on your back. You may bend your knees for comfort.  Grasp a broomstick or cane so your hands are about shoulder width apart. Your right / left hand should grip the end of the stick, so that your hand is positioned "thumbs-up," as if you were about to shake hands.  Using your healthy arm to lead, raise your right / left arm overhead, until you feel a gentle stretch in your shoulder. Hold for __________ seconds.  Use the stick to assist in returning your right / left arm to its starting position. Repeat __________ times. Complete this exercise __________ times per day.  ROM - Internal Rotation, Supine   Lie on your back on a firm surface. Place your right / left elbow about 60 degrees away from your side. Elevate your elbow with a folded towel, so that the elbow and shoulder are the same height.  Using a broomstick or cane and your strong arm, pull your right / left hand toward your body until you feel a gentle stretch, but no increase in your shoulder pain. Keep your shoulder and elbow in place throughout the exercise.  Hold for __________ seconds. Slowly return to the starting position. Repeat __________ times. Complete this exercise __________ times per day. STRETCH - Internal Rotation  Place your right / left hand behind your back, palm up.  Throw a towel or belt over your opposite shoulder. Grasp the towel with your right / left hand.  While keeping an upright posture, gently pull up on the towel, until you feel a stretch in the front of your right / left shoulder.  Avoid shrugging your right / left shoulder as your arm rises, by keeping your shoulder blade tucked down and toward your mid-back spine.  Hold for __________ seconds. Release the stretch, by lowering your healthy hand. Repeat __________ times. Complete this exercise __________ times per day. ROM - Internal Rotation    Using an underhand grip, grasp a stick behind your back with both hands.  While standing upright with good posture, slide the stick up your back until you feel a mild stretch in the front of your shoulder.  Hold for __________ seconds. Slowly return to your starting position. Repeat __________ times. Complete this exercise __________ times per day.  STRETCH  Posterior Shoulder Capsule   Stand or sit with good posture. Grasp your right / left elbow and draw it across your chest, keeping it at the same height as your shoulder.  Pull your elbow, so your upper arm comes in closer to your chest. Pull until you feel a gentle stretch in the back of your shoulder.  Hold for __________ seconds. Repeat __________ times. Complete this exercise __________ times per day. STRENGTHENING EXERCISES - Impingement Syndrome (Rotator Cuff Tendinitis, Bursitis) These exercises may help you when beginning to rehabilitate your injury. They may resolve your symptoms with or without further involvement from your physician, physical therapist or athletic trainer. While  completing these exercises, remember:  Muscles can gain both the endurance and the strength needed for everyday activities through controlled exercises.  Complete these exercises as instructed by your physician, physical therapist or athletic trainer. Increase the resistance and repetitions only as guided.  You may experience muscle soreness or fatigue, but the pain or discomfort you are trying to eliminate should never worsen during these exercises. If this pain does get worse, stop and make sure you are following the directions exactly. If the pain is still present after adjustments, discontinue the exercise until you can discuss the trouble with your clinician.  During your recovery, avoid activity or exercises which involve actions that place your injured hand or elbow above your head or behind your back or head. These positions stress the tissues  which you are trying to heal. STRENGTH - Scapular Depression and Adduction   With good posture, sit on a firm chair. Support your arms in front of you, with pillows, arm rests, or on a table top. Have your elbows in line with the sides of your body.  Gently draw your shoulder blades down and toward your mid-back spine. Gradually increase the tension, without tensing the muscles along the top of your shoulders and the back of your neck.  Hold for __________ seconds. Slowly release the tension and relax your muscles completely before starting the next repetition.  After you have practiced this exercise, remove the arm support and complete the exercise in standing as well as sitting position. Repeat __________ times. Complete this exercise __________ times per day.  STRENGTH - Shoulder Abductors, Isometric  With good posture, stand or sit about 4-6 inches from a wall, with your right / left side facing the wall.  Bend your right / left elbow. Gently press your right / left elbow into the wall. Increase the pressure gradually, until you are pressing as hard as you can, without shrugging your shoulder or increasing any shoulder discomfort.  Hold for __________ seconds.  Release the tension slowly. Relax your shoulder muscles completely before you begin the next repetition. Repeat __________ times. Complete this exercise __________ times per day.  STRENGTH - External Rotators, Isometric  Keep your right / left elbow at your side and bend it 90 degrees.  Step into a door frame so that the outside of your right / left wrist can press against the door frame without your upper arm leaving your side.  Gently press your right / left wrist into the door frame, as if you were trying to swing the back of your hand away from your stomach. Gradually increase the tension, until you are pressing as hard as you can, without shrugging your shoulder or increasing any shoulder discomfort.  Hold for __________  seconds.  Release the tension slowly. Relax your shoulder muscles completely before you begin the next repetition. Repeat __________ times. Complete this exercise __________ times per day.  STRENGTH - Supraspinatus   Stand or sit with good posture. Grasp a __________ weight, or an exercise band or tubing, so that your hand is "thumbs-up," like you are shaking hands.  Slowly lift your right / left arm in a "V" away from your thigh, diagonally into the space between your side and straight ahead. Lift your hand to shoulder height or as far as you can, without increasing any shoulder pain. At first, many people do not lift their hands above shoulder height.  Avoid shrugging your right / left shoulder as your arm rises, by keeping your  shoulder blade tucked down and toward your mid-back spine.  Hold for __________ seconds. Control the descent of your hand, as you slowly return to your starting position. Repeat __________ times. Complete this exercise __________ times per day.  STRENGTH - External Rotators  Secure a rubber exercise band or tubing to a fixed object (table, pole) so that it is at the same height as your right / left elbow when you are standing or sitting on a firm surface.  Stand or sit so that the secured exercise band is at your uninjured side.  Bend your right / left elbow 90 degrees. Place a folded towel or small pillow under your right / left arm, so that your elbow is a few inches away from your side.  Keeping the tension on the exercise band, pull it away from your body, as if pivoting on your elbow. Be sure to keep your body steady, so that the movement is coming only from your rotating shoulder.  Hold for __________ seconds. Release the tension in a controlled manner, as you return to the starting position. Repeat __________ times. Complete this exercise __________ times per day.  STRENGTH - Internal Rotators   Secure a rubber exercise band or tubing to a fixed object  (table, pole) so that it is at the same height as your right / left elbow when you are standing or sitting on a firm surface.  Stand or sit so that the secured exercise band is at your right / left side.  Bend your elbow 90 degrees. Place a folded towel or small pillow under your right / left arm so that your elbow is a few inches away from your side.  Keeping the tension on the exercise band, pull it across your body, toward your stomach. Be sure to keep your body steady, so that the movement is coming only from your rotating shoulder.  Hold for __________ seconds. Release the tension in a controlled manner, as you return to the starting position. Repeat __________ times. Complete this exercise __________ times per day.  STRENGTH - Scapular Protractors, Standing   Stand arms length away from a wall. Place your hands on the wall, keeping your elbows straight.  Begin by dropping your shoulder blades down and toward your mid-back spine.  To strengthen your protractors, keep your shoulder blades down, but slide them forward on your rib cage. It will feel as if you are lifting the back of your rib cage away from the wall. This is a subtle motion and can be challenging to complete. Ask your caregiver for further instruction, if you are not sure you are doing the exercise correctly.  Hold for __________ seconds. Slowly return to the starting position, resting the muscles completely before starting the next repetition. Repeat __________ times. Complete this exercise __________ times per day. STRENGTH - Scapular Protractors, Supine  Lie on your back on a firm surface. Extend your right / left arm straight into the air while holding a __________ weight in your hand.  Keeping your head and back in place, lift your shoulder off the floor.  Hold for __________ seconds. Slowly return to the starting position, and allow your muscles to relax completely before starting the next repetition. Repeat  __________ times. Complete this exercise __________ times per day. STRENGTH - Scapular Protractors, Quadruped  Get onto your hands and knees, with your shoulders directly over your hands (or as close as you can be, comfortably).  Keeping your elbows locked, lift the back  of your rib cage up into your shoulder blades, so your mid-back rounds out. Keep your neck muscles relaxed.  Hold this position for __________ seconds. Slowly return to the starting position and allow your muscles to relax completely before starting the next repetition. Repeat __________ times. Complete this exercise __________ times per day.  STRENGTH - Scapular Retractors  Secure a rubber exercise band or tubing to a fixed object (table, pole), so that it is at the height of your shoulders when you are either standing, or sitting on a firm armless chair.  With a palm down grip, grasp an end of the band in each hand. Straighten your elbows and lift your hands straight in front of you, at shoulder height. Step back, away from the secured end of the band, until it becomes tense.  Squeezing your shoulder blades together, draw your elbows back toward your sides, as you bend them. Keep your upper arms lifted away from your body throughout the exercise.  Hold for __________ seconds. Slowly ease the tension on the band, as you reverse the directions and return to the starting position. Repeat __________ times. Complete this exercise __________ times per day. STRENGTH - Shoulder Extensors   Secure a rubber exercise band or tubing to a fixed object (table, pole) so that it is at the height of your shoulders when you are either standing, or sitting on a firm armless chair.  With a thumbs-up grip, grasp an end of the band in each hand. Straighten your elbows and lift your hands straight in front of you, at shoulder height. Step back, away from the secured end of the band, until it becomes tense.  Squeezing your shoulder blades  together, pull your hands down to the sides of your thighs. Do not allow your hands to go behind you.  Hold for __________ seconds. Slowly ease the tension on the band, as you reverse the directions and return to the starting position. Repeat __________ times. Complete this exercise __________ times per day.  STRENGTH - Scapular Retractors and External Rotators   Secure a rubber exercise band or tubing to a fixed object (table, pole) so that it is at the height as your shoulders, when you are either standing, or sitting on a firm armless chair.  With a palm down grip, grasp an end of the band in each hand. Bend your elbows 90 degrees and lift your elbows to shoulder height, at your sides. Step back, away from the secured end of the band, until it becomes tense.  Squeezing your shoulder blades together, rotate your shoulders so that your upper arms and elbows remain stationary, but your fists travel upward to head height.  Hold for __________ seconds. Slowly ease the tension on the band, as you reverse the directions and return to the starting position. Repeat __________ times. Complete this exercise __________ times per day.  STRENGTH - Scapular Retractors and External Rotators, Rowing   Secure a rubber exercise band or tubing to a fixed object (table, pole) so that it is at the height of your shoulders, when you are either standing, or sitting on a firm armless chair.  With a palm down grip, grasp an end of the band in each hand. Straighten your elbows and lift your hands straight in front of you, at shoulder height. Step back, away from the secured end of the band, until it becomes tense.  Step 1: Squeeze your shoulder blades together. Bending your elbows, draw your hands to your chest, as  if you are rowing a boat. At the end of this motion, your hands and elbow should be at shoulder height and your elbows should be out to your sides.  Step 2: Rotate your shoulders, to raise your hands above  your head. Your forearms should be vertical and your upper arms should be horizontal.  Hold for __________ seconds. Slowly ease the tension on the band, as you reverse the directions and return to the starting position. Repeat __________ times. Complete this exercise __________ times per day.  STRENGTH  Scapular Depressors  Find a sturdy chair without wheels, such as a dining room chair.  Keeping your feet on the floor, and your hands on the chair arms, lift your bottom up from the seat, and lock your elbows.  Keeping your elbows straight, allow gravity to pull your body weight down. Your shoulders will rise toward your ears.  Raise your body against gravity by drawing your shoulder blades down your back, shortening the distance between your shoulders and ears. Although your feet should always maintain contact with the floor, your feet should progressively support less body weight, as you get stronger.  Hold for __________ seconds. In a controlled and slow manner, lower your body weight to begin the next repetition. Repeat __________ times. Complete this exercise __________ times per day.  Document Released: 05/11/2005 Document Revised: 08/03/2011 Document Reviewed: 08/23/2008 [facility] Patient Information 2014 [place], [place].

## 2013-05-12 NOTE — Progress Notes (Signed)
Subjective:    Patient ID: [name], female    DOB: [birth year], 37 y.o.   MRN: [id number]  Shoulder Pain  Pertinent negatives include no fever.  Otalgia  Associated symptoms include coughing.    Here for followup on right ear pain. Office visit last week and subsequent emergency room visit 2 days ago for same reviewed. Reports pain symptoms and pressure have resolved with amoxicillin. Residual dry cough from drainage, possible refill on codeine cough syrup  Also right shoulder pain and neck pain Onset 2 weeks ago, gradually worse Painful to lie on right side at night Painful to raise arm from side of body Denies weakness, numbness, swelling Precipitated by over use during moving activity 2 weeks ago  Past Medical History  Diagnosis Date  . Hypertension   . Ovarian cyst   . OSA (obstructive sleep apnea)     on CPAP   . IBS (irritable bowel syndrome)   . Postpartum cardiomyopathy   . Depression   . Anxiety     Review of Systems  Constitutional: Positive for fatigue. Negative for fever.  HENT: Negative for ear pain, postnasal drip and trouble swallowing.   Respiratory: Positive for cough. Negative for shortness of breath and wheezing.   Cardiovascular: Negative for chest pain and leg swelling.  Musculoskeletal: Positive for myalgias. Negative for back pain and joint swelling.       Objective:   Physical Exam BP 130/92  Pulse 102  Temp(Src) 98.6 F (37 C) (Oral)  Wt 244 lb 12.8 oz (111.041 kg)  SpO2 95% Wt Readings from Last 3 Encounters:  05/12/13 244 lb 12.8 oz (111.041 kg)  05/04/13 244 lb (110.678 kg)  04/05/13 246 lb 14.4 oz (111.993 kg)   Constitutional: She is obese, but appears well-developed and well-nourished. No distress.  HENT: Head: Normocephalic and atraumatic. Ears: B TMs ok, no erythema or effusion; Nose: Nose normal. Mouth/Throat: Oropharynx is clear and moist. No oropharyngeal exudate.  Eyes: Conjunctivae and EOM are normal. Pupils are  equal, round, and reactive to light. No scleral icterus.  Neck: Normal range of motion. Neck supple. No JVD present. No thyromegaly present.  Cardiovascular: Normal rate, regular rhythm and normal heart sounds.  No murmur heard. No BLE edema. Pulmonary/Chest: Effort normal and breath sounds normal. No respiratory distress. She has no wheezes.  Musculoskeletal: R Shoulder: Full range of motion. Neurovascularly intact distally. Good strength with stress of rotator cuff but causes pain. Positive impingement signs. Otherwise, normal range of motion, no joint effusions. No gross deformities Skin: Skin is warm and dry. No rash noted. No erythema.  Psychiatric: She has a mildly anxious mood and affect. Her behavior is normal. Judgment and thought content normal.   Lab Results  Component Value Date   WBC 9.1 01/01/2013   HGB 13.5 01/01/2013   HCT 39.8 01/01/2013   PLT 288 01/01/2013   GLUCOSE 96 02/13/2013   CHOL 144 02/15/2013   TRIG 83.0 02/15/2013   HDL 43.30 02/15/2013   LDLCALC 84 02/15/2013   ALT 33 01/13/2007   AST 30 01/13/2007   NA 135 02/13/2013   K 4.5 02/13/2013   CL 105 02/13/2013   CREATININE 1.0 02/13/2013   BUN 11 02/13/2013   CO2 25 02/13/2013   TSH 1.80 12/28/2012        Assessment & Plan:   R ear pain - resolved -residual cough from drainage, but no evidence for persistent infection. Interval history reviewed   R shoulder pain -  impingement syndrome  Treat with prednisone taper over next 6 days as anti-inflammatory Change muscle relaxants from Robaxin to Flexeril  Okay to continue codeine syrup as needed for postinfectious cough or for pain  Refer to sports medicine to consider other anti-inflammatory or injection as needed  Also refer to physical therapy

## 2013-05-12 NOTE — Progress Notes (Signed)
Pre-visit discussion using our clinic review tool. No additional management support is needed unless otherwise documented below in the visit note.  

## 2013-05-15 ENCOUNTER — Encounter (HOSPITAL_COMMUNITY): Payer: 59 | Attending: Cardiology

## 2013-05-15 DIAGNOSIS — Z5189 Encounter for other specified aftercare: Secondary | ICD-10-CM | POA: Insufficient documentation

## 2013-05-15 DIAGNOSIS — G4733 Obstructive sleep apnea (adult) (pediatric): Secondary | ICD-10-CM | POA: Insufficient documentation

## 2013-05-15 DIAGNOSIS — I10 Essential (primary) hypertension: Secondary | ICD-10-CM | POA: Insufficient documentation

## 2013-05-17 ENCOUNTER — Encounter (HOSPITAL_COMMUNITY): Payer: 59

## 2013-05-22 ENCOUNTER — Other Ambulatory Visit (INDEPENDENT_AMBULATORY_CARE_PROVIDER_SITE_OTHER): Payer: 59

## 2013-05-22 ENCOUNTER — Ambulatory Visit (INDEPENDENT_AMBULATORY_CARE_PROVIDER_SITE_OTHER): Payer: 59 | Admitting: Family Medicine

## 2013-05-22 ENCOUNTER — Encounter (HOSPITAL_COMMUNITY): Payer: 59

## 2013-05-22 ENCOUNTER — Encounter: Payer: Self-pay | Admitting: Family Medicine

## 2013-05-22 ENCOUNTER — Encounter: Payer: Self-pay | Admitting: Internal Medicine

## 2013-05-22 VITALS — BP 122/86 | HR 112 | Wt 252.0 lb

## 2013-05-22 DIAGNOSIS — M75100 Unspecified rotator cuff tear or rupture of unspecified shoulder, not specified as traumatic: Secondary | ICD-10-CM

## 2013-05-22 DIAGNOSIS — M25511 Pain in right shoulder: Secondary | ICD-10-CM

## 2013-05-22 DIAGNOSIS — M25519 Pain in unspecified shoulder: Secondary | ICD-10-CM

## 2013-05-22 DIAGNOSIS — M7551 Bursitis of right shoulder: Secondary | ICD-10-CM

## 2013-05-22 MED ORDER — MELOXICAM 15 MG PO TABS: 15.0000 mg | ORAL_TABLET | Freq: Every day | ORAL | Status: DC

## 2013-05-22 NOTE — Progress Notes (Signed)
Pre-visit discussion using our clinic review tool. No additional management support is needed unless otherwise documented below in the visit note.  
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
## 2013-05-22 NOTE — Patient Instructions (Addendum)
Very nice to meet you Change you sitting position so computer screen is at eye level Work on posture, stand on wall 5 minutes a day with heels, butt, shoulders and head touching wall Exercises most days of the week Meloxicam daily for 10 days then as needed.  Ice 20 minutes 2 times a day Come back in 4 weeks to make sure you are doing well.

## 2013-05-22 NOTE — Assessment & Plan Note (Signed)
Procedure: Real-time Ultrasound Guided Injection of right glenohumeral joint Device: GE Logiq E  Ultrasound guided injection is preferred based studies that show increased duration, increased effect, greater accuracy, decreased procedural pain, increased response rate with ultrasound guided versus blind injection.  Verbal informed consent obtained.  Time-out conducted.  Noted no overlying erythema, induration, or other signs of local infection.  Skin prepped in a sterile fashion.  Local anesthesia: Topical Ethyl chloride.  With sterile technique and under real time ultrasound guidance:  Joint visualized.  23g 1  inch needle inserted posterior approach. Pictures taken for needle placement. Patient did have injection of 2 cc of 1% lidocaine, 2 cc of 0.5% Marcaine, and 1.0 cc of Kenalog 40 mg/dL. Completed without difficulty  Pain immediately resolved suggesting accurate placement of the medication.  Advised to call if fevers/chills, erythema, induration, drainage, or persistent bleeding.  Images permanently stored and available for review in the ultrasound unit.  Impression: Technically successful ultrasound guided injection.  Patient had an injection as described above. Meloxicam daily for 10 days, discussed icing protocol and home exercise therapy. Patient was given theraband today for exercises. Patient will return in 3-4 weeks for further evaluation. He continued to have pain I would like to get x-rays of the shoulder as well as possibly the neck. We also sent to formal physical therapy at that time.

## 2013-05-23 ENCOUNTER — Other Ambulatory Visit: Payer: Self-pay | Admitting: *Deleted

## 2013-05-23 ENCOUNTER — Telehealth (HOSPITAL_COMMUNITY): Payer: Self-pay | Admitting: *Deleted

## 2013-05-23 MED ORDER — FLUTICASONE PROPIONATE 50 MCG/ACT NA SUSP: 1.0000 | Freq: Every day | NASAL | Status: DC

## 2013-05-23 MED ORDER — DICYCLOMINE HCL 10 MG PO CAPS: 10.0000 mg | ORAL_CAPSULE | Freq: Three times a day (TID) | ORAL | Status: DC | PRN

## 2013-05-23 MED ORDER — ONDANSETRON 4 MG PO TBDP: 4.0000 mg | ORAL_TABLET | Freq: Three times a day (TID) | ORAL | Status: DC | PRN

## 2013-05-23 NOTE — Telephone Encounter (Signed)
Sent email needing refills on her bentyl, flonase & zofran sent to [facility].../lmb

## 2013-05-24 ENCOUNTER — Encounter (HOSPITAL_COMMUNITY): Payer: 59

## 2013-05-26 ENCOUNTER — Encounter (HOSPITAL_COMMUNITY): Payer: 59

## 2013-05-26 ENCOUNTER — Telehealth (HOSPITAL_COMMUNITY): Payer: Self-pay | Admitting: *Deleted

## 2013-05-29 ENCOUNTER — Encounter: Payer: Self-pay | Admitting: Family Medicine

## 2013-05-29 ENCOUNTER — Other Ambulatory Visit: Payer: Self-pay | Admitting: Family Medicine

## 2013-05-29 ENCOUNTER — Encounter (HOSPITAL_COMMUNITY): Payer: 59

## 2013-05-29 MED ORDER — GABAPENTIN 300 MG PO CAPS: ORAL_CAPSULE | ORAL | Status: DC

## 2013-05-31 ENCOUNTER — Encounter (HOSPITAL_COMMUNITY): Payer: 59

## 2013-06-02 ENCOUNTER — Encounter (HOSPITAL_COMMUNITY): Payer: 59

## 2013-06-05 ENCOUNTER — Ambulatory Visit: Payer: 59 | Admitting: Dietician

## 2013-06-05 ENCOUNTER — Encounter (HOSPITAL_COMMUNITY): Payer: 59

## 2013-06-07 ENCOUNTER — Encounter (HOSPITAL_COMMUNITY): Payer: 59

## 2013-06-08 ENCOUNTER — Encounter: Payer: Self-pay | Admitting: Internal Medicine

## 2013-06-08 ENCOUNTER — Encounter: Payer: Self-pay | Admitting: Family Medicine

## 2013-06-09 ENCOUNTER — Inpatient Hospital Stay (HOSPITAL_COMMUNITY)
Admission: AD | Admit: 2013-06-09 | Discharge: 2013-06-09 | Disposition: A | Discharge status: Home / Self Care | Payer: 59 | Source: Ambulatory Visit | Attending: Obstetrics & Gynecology | Admitting: Obstetrics & Gynecology

## 2013-06-09 ENCOUNTER — Inpatient Hospital Stay (HOSPITAL_COMMUNITY): Payer: 59

## 2013-06-09 ENCOUNTER — Encounter (HOSPITAL_COMMUNITY): Payer: 59

## 2013-06-09 ENCOUNTER — Telehealth: Payer: Self-pay | Admitting: Family Medicine

## 2013-06-09 ENCOUNTER — Encounter (HOSPITAL_COMMUNITY): Payer: Self-pay | Admitting: *Deleted

## 2013-06-09 DIAGNOSIS — N925 Other specified irregular menstruation: Secondary | ICD-10-CM | POA: Insufficient documentation

## 2013-06-09 DIAGNOSIS — R109 Unspecified abdominal pain: Secondary | ICD-10-CM | POA: Insufficient documentation

## 2013-06-09 DIAGNOSIS — I10 Essential (primary) hypertension: Secondary | ICD-10-CM | POA: Insufficient documentation

## 2013-06-09 DIAGNOSIS — D259 Leiomyoma of uterus, unspecified: Secondary | ICD-10-CM

## 2013-06-09 DIAGNOSIS — R000 Tachycardia, unspecified: Secondary | ICD-10-CM | POA: Insufficient documentation

## 2013-06-09 DIAGNOSIS — N938 Other specified abnormal uterine and vaginal bleeding: Secondary | ICD-10-CM | POA: Insufficient documentation

## 2013-06-09 DIAGNOSIS — N949 Unspecified condition associated with female genital organs and menstrual cycle: Secondary | ICD-10-CM | POA: Insufficient documentation

## 2013-06-09 HISTORY — DX: Benign neoplasm of connective and other soft tissue, unspecified: D21.9

## 2013-06-09 HISTORY — DX: Reserved for concepts with insufficient information to code with codable children: IMO0002

## 2013-06-09 LAB — CBC
HCT: 37.3 % (ref 36.0–46.0)
Hemoglobin: 12.1 g/dL (ref 12.0–15.0)
MCH: 29.2 pg (ref 26.0–34.0)
MCHC: 32.4 g/dL (ref 30.0–36.0)
MCV: 89.9 fL (ref 78.0–100.0)
Platelets: 298 10*3/uL (ref 150–400)
RBC: 4.15 MIL/uL (ref 3.87–5.11)
RDW: 14.7 % (ref 11.5–15.5)
WBC: 9.4 10*3/uL (ref 4.0–10.5)

## 2013-06-09 LAB — WET PREP, GENITAL
Clue Cells Wet Prep HPF POC: NONE SEEN
Trich, Wet Prep: NONE SEEN
Yeast Wet Prep HPF POC: NONE SEEN

## 2013-06-09 LAB — POCT PREGNANCY, URINE: Preg Test, Ur: NEGATIVE

## 2013-06-09 MED ORDER — KETOROLAC TROMETHAMINE 60 MG/2ML IM SOLN
60.0000 mg | Freq: Once | INTRAMUSCULAR | Status: AC
  Administered 2013-06-09: 60 mg via INTRAMUSCULAR
  Filled 2013-06-09: qty 2

## 2013-06-09 MED ORDER — NORETHIN-ETH ESTRAD TRIPHASIC 0.5/0.75/1-35 MG-MCG PO TABS: 1.0000 | ORAL_TABLET | Freq: Every day | ORAL | Status: DC

## 2013-06-09 MED ORDER — IBUPROFEN 800 MG PO TABS: 800.0000 mg | ORAL_TABLET | Freq: Three times a day (TID) | ORAL | Status: DC

## 2013-06-09 MED ORDER — GABAPENTIN 100 MG PO CAPS: 100.0000 mg | ORAL_CAPSULE | Freq: Two times a day (BID) | ORAL | Status: DC

## 2013-06-09 NOTE — Discharge Instructions (Signed)

## 2013-06-09 NOTE — Telephone Encounter (Signed)
Medication change discussed with patient

## 2013-06-09 NOTE — MAU Provider Note (Signed)
Attestation of Attending Supervision of Advanced Practitioner (PA/CNM/NP): Evaluation and management procedures were performed by the Advanced Practitioner under my supervision and collaboration.  I have reviewed the Advanced Practitioner's note and chart, and I agree with the management and plan.  Maneh Sieben, MD, FACOG Attending Obstetrician & Gynecologist Faculty Practice, [facility]  

## 2013-06-09 NOTE — MAU Note (Signed)
Ongoing bleeding since 12/30. Started off watery then became thicker like a reg period.  Now has gotten heavier and passing a lot of clots.

## 2013-06-09 NOTE — MAU Provider Note (Signed)
History     CSN: [id number]  Arrival date and time: 06/09/13 1314   None     Chief Complaint  Patient presents with  . Vaginal Bleeding   HPI [name] is a 38 y.o. G60P1011 female presenting for heavy vaginal bleeding.  Last normal menstrual period was 03/25/2013.  Started bleeding again on 12/30 - began as light watery pink and progressed to heavy red flow changing super tampons q1.5hr and passing large clots.  Previously had 28 cycle with menses lasting 7 days.  Endorses deep abdominal pain, 8-9/10.    Contacted her PCP last night re: heavy bleeding/irregular period and received OCP rx today.  Has not filled it yet.   Currently married.  No new partners.  No sexual activity for a few weeks.  Describes being sick during November and December; also placed on course of steroids during that time.        Past Medical History  Diagnosis Date  . Hypertension   . Ovarian cyst     1999; surgically removed  . OSA (obstructive sleep apnea)     on CPAP   . IBS (irritable bowel syndrome)   . 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 Depression   . Anxiety   . Fibroid     age 20  . Termination of pregnancy     due to cardiac risk    Past Surgical History  Procedure Laterality Date  . Abdominal surgery    . Cesarean section    . Cholecystectomy    . Foot surgery    . Tubal ligation  1999    Family History  Problem Relation Age of Onset  . Emphysema Maternal Grandmother     smoked  . Allergies Daughter   . Heart disease Maternal Grandmother 59    MI  . Rheum arthritis Mother     History  Substance Use Topics  . Smoking status: Never Smoker   . Smokeless tobacco: Never Used  . Alcohol Use: No    Allergies:  Allergies  Allergen Reactions  . Ivp Dye [Iodinated Diagnostic Agents] Other (See Comments)    Did something to my liver and kidneys told not to use it  . Vancomycin Other (See Comments)    "did something to my kidneys"  . Aspirin Other  (See Comments)    wheezing  . Ciprofloxacin Itching and Rash  . Sulfa Antibiotics Itching and Rash    Prescriptions prior to admission  Medication Sig Dispense Refill  . acetaminophen (TYLENOL) 500 MG tablet Take 500 mg by mouth every 6 (six) hours as needed for pain.      . Armodafinil (NUVIGIL) 250 MG tablet Take 250 mg by mouth daily.      Marland Kitchen buPROPion (WELLBUTRIN XL) 150 MG 24 hr tablet Take 450 mg by mouth daily.      . carvedilol (COREG) 25 MG tablet Take 1 tablet (25 mg total) by mouth 2 (two) times daily with a meal.  60 tablet  12  . cholecalciferol (VITAMIN D) 1000 UNITS tablet Take 1,000 Units by mouth daily.      . colesevelam (WELCHOL) 625 MG tablet Take 625 mg by mouth daily.       . cyclobenzaprine (FLEXERIL) 5 MG tablet Take 1 tablet (5 mg total) by mouth every 8 (eight) hours as needed for muscle spasms.  30 tablet  1  . diazepam (VALIUM) 10 MG tablet Take 10 mg by mouth  at bedtime.       . dicyclomine (BENTYL) 10 MG capsule Take 1 capsule (10 mg total) by mouth 3 (three) times daily as needed (for abdominal cramping).  30 capsule  0  . FLUoxetine (PROZAC) 20 MG capsule Take 1 capsule by mouth every morning.      . fluticasone (FLONASE) 50 MCG/ACT nasal spray Place 1 spray into both nostrils at bedtime. Before donning CPAP  16 g  2  . furosemide (LASIX) 40 MG tablet Take 1 tablet (40 mg total) by mouth 2 (two) times daily.  60 tablet  12  . gabapentin (NEURONTIN) 100 MG capsule Take 1 capsule (100 mg total) by mouth 2 (two) times daily.  60 capsule  3  . gabapentin (NEURONTIN) 300 MG capsule Daily at bedtime.  30 capsule  3  . lidocaine-hydrocortisone (ANAMANTEL HC) 3-0.5 % CREA Place 1 Applicatorful rectally 2 (two) times daily.  30 g  0  . losartan (COZAAR) 100 MG tablet Take 100 mg by mouth daily.      . meclizine (ANTIVERT) 25 MG tablet Take 1 tablet (25 mg total) by mouth 3 (three) times daily as needed for dizziness or nausea.  30 tablet  0  . meloxicam (MOBIC) 15 MG  tablet Take 1 tablet (15 mg total) by mouth daily.  30 tablet  0  . ondansetron (ZOFRAN-ODT) 4 MG disintegrating tablet Take 1 tablet (4 mg total) by mouth every 8 (eight) hours as needed.  20 tablet  0  . polyethylene glycol powder (GLYCOLAX/MIRALAX) powder Take 17 g by mouth 2 (two) times daily as needed.  3350 g  1  . potassium chloride SA (K-DUR,KLOR-CON) 20 MEQ tablet Take 2 tablets (40 mEq total) by mouth 2 (two) times daily.  124 tablet  12  . promethazine (PHENERGAN) 25 MG tablet Take 1 tablet (25 mg total) by mouth every 6 (six) hours as needed for nausea.  30 tablet  0    Review of Systems  Constitutional: Negative for fever and chills.  Eyes: Negative for blurred vision.  Respiratory: Negative for cough and shortness of breath.   Cardiovascular: Negative for chest pain and palpitations.  Gastrointestinal: Positive for abdominal pain. Negative for nausea and vomiting.  Genitourinary: Negative for dysuria.       Positive for vaginal bleeding.  Negative for vaginal discharge.  Skin: Negative for rash.  Neurological: Negative for headaches.   Physical Exam   Blood pressure 135/92, pulse 112, temperature 98.1 F (36.7 C), temperature source Oral, resp. rate 18, height 5\' 4"  (1.626 m), weight 113.853 kg (251 lb), last menstrual period 05/23/2013.  Physical Exam  Nursing note and vitals reviewed. Constitutional: She is oriented to person, place, and time. She appears well-developed and well-nourished. No distress.  HENT:  Head: Normocephalic and atraumatic.  Eyes: Pupils are equal, round, and reactive to light.  Cardiovascular: Normal rate, regular rhythm and normal heart sounds.   Respiratory: Effort normal. No respiratory distress.  GI: Soft. She exhibits no distension. There is tenderness (lower abdomen, left > right). There is no rebound.  Genitourinary:  Small reddish-brown discharge present in vaginal vault.  No active bleeding.  No masses appreciated.  Neurological: She  is alert and oriented to person, place, and time.  Skin: Skin is warm and dry. No rash noted.  Psychiatric: She has a normal mood and affect. Her behavior is normal.   Results for orders placed during the hospital encounter of 06/09/13 (from the past 24 hour(s))  POCT PREGNANCY, URINE     Status: None   Collection Time    06/09/13  1:48 PM      Result Value Range   Preg Test, Ur NEGATIVE  NEGATIVE  CBC     Status: None   Collection Time    06/09/13  2:19 PM      Result Value Range   WBC 9.4  4.0 - 10.5 K/uL   RBC 4.15  3.87 - 5.11 MIL/uL   Hemoglobin 12.1  12.0 - 15.0 g/dL   HCT 37.3  36.0 - 46.0 %   MCV 89.9  78.0 - 100.0 fL   MCH 29.2  26.0 - 34.0 pg   MCHC 32.4  30.0 - 36.0 g/dL   RDW 14.7  11.5 - 15.5 %   Platelets 298  150 - 400 K/uL  WET PREP, GENITAL     Status: Abnormal   Collection Time    06/09/13  2:33 PM      Result Value Range   Yeast Wet Prep HPF POC NONE SEEN  NONE SEEN   Trich, Wet Prep NONE SEEN  NONE SEEN   Clue Cells Wet Prep HPF POC NONE SEEN  NONE SEEN   WBC, Wet Prep HPF POC FEW (*) NONE SEEN    MAU Course  Procedures  MDM CBC, [place], Wet-Prep, G/C, UPT  Assessment and Plan  #DUB - Recommend not taking OCPs due to poor candidate (hx of DVT, obesity, cardiomyopathy) - Discussed other options include progesterone-only therapy / Mirenia IUD - Patient is stable; bleeding has slowed down; H&H is wnl - Will write for Ibuprofen 800 mg TID x 5 days ( hold Mobic ) - Patient has well-controlled HTN; followed closely at [facility]; discussed NSAID use with HTN; patient verbalized understanding and will be compliant with BP meds  - Discussed setting up GYN care; interested in [facility]; patient to call and make an appointment  #Uterine Fibroid - confirmed on [place] - believe this is source of abdominal pain and likely heavy bleeding as well - pain has improved with Toradol given in MAU - plans to seek GYN care at [facility]  #Tachycardia - likely secondary  to pain and anxiety  TUCKER, BRITTON L 06/09/2013, 2:41 PM

## 2013-06-09 NOTE — MAU Note (Signed)
C/o heavy bleeding  With large clots for past 4 days; hx of regular periods until the end of December; LNMP was November 1st;;

## 2013-06-10 LAB — GC/CHLAMYDIA PROBE AMP
CT Probe RNA: NEGATIVE
GC Probe RNA: NEGATIVE

## 2013-06-16 ENCOUNTER — Ambulatory Visit (INDEPENDENT_AMBULATORY_CARE_PROVIDER_SITE_OTHER): Payer: 59 | Admitting: Internal Medicine

## 2013-06-16 ENCOUNTER — Ambulatory Visit (INDEPENDENT_AMBULATORY_CARE_PROVIDER_SITE_OTHER)
Admission: RE | Admit: 2013-06-16 | Discharge: 2013-06-16 | Disposition: A | Discharge status: Home / Self Care | Payer: 59 | Source: Ambulatory Visit | Attending: Internal Medicine | Admitting: Internal Medicine

## 2013-06-16 ENCOUNTER — Encounter: Payer: Self-pay | Admitting: Internal Medicine

## 2013-06-16 ENCOUNTER — Other Ambulatory Visit (INDEPENDENT_AMBULATORY_CARE_PROVIDER_SITE_OTHER): Payer: 59

## 2013-06-16 VITALS — BP 126/88 | HR 121 | Temp 97.7°F | Resp 16 | Ht 65.0 in | Wt 255.0 lb

## 2013-06-16 DIAGNOSIS — R0600 Dyspnea, unspecified: Secondary | ICD-10-CM

## 2013-06-16 DIAGNOSIS — R059 Cough, unspecified: Secondary | ICD-10-CM

## 2013-06-16 DIAGNOSIS — I10 Essential (primary) hypertension: Secondary | ICD-10-CM

## 2013-06-16 DIAGNOSIS — R0609 Other forms of dyspnea: Secondary | ICD-10-CM

## 2013-06-16 DIAGNOSIS — R05 Cough: Secondary | ICD-10-CM

## 2013-06-16 DIAGNOSIS — J189 Pneumonia, unspecified organism: Secondary | ICD-10-CM

## 2013-06-16 DIAGNOSIS — R0989 Other specified symptoms and signs involving the circulatory and respiratory systems: Secondary | ICD-10-CM

## 2013-06-16 DIAGNOSIS — J45901 Unspecified asthma with (acute) exacerbation: Secondary | ICD-10-CM

## 2013-06-16 LAB — CBC WITH DIFFERENTIAL/PLATELET
Basophils Absolute: 0 10*3/uL (ref 0.0–0.1)
Basophils Relative: 0.4 % (ref 0.0–3.0)
Eosinophils Absolute: 0.3 10*3/uL (ref 0.0–0.7)
Eosinophils Relative: 5 % (ref 0.0–5.0)
HCT: 38.2 % (ref 36.0–46.0)
Hemoglobin: 12.8 g/dL (ref 12.0–15.0)
Lymphocytes Relative: 21.2 % (ref 12.0–46.0)
Lymphs Abs: 1.2 10*3/uL (ref 0.7–4.0)
MCHC: 33.3 g/dL (ref 30.0–36.0)
MCV: 88.2 fl (ref 78.0–100.0)
Monocytes Absolute: 0.5 10*3/uL (ref 0.1–1.0)
Monocytes Relative: 8.6 % (ref 3.0–12.0)
Neutro Abs: 3.5 10*3/uL (ref 1.4–7.7)
Neutrophils Relative %: 64.8 % (ref 43.0–77.0)
Platelets: 246 10*3/uL (ref 150.0–400.0)
RBC: 4.34 Mil/uL (ref 3.87–5.11)
RDW: 15.1 % — ABNORMAL HIGH (ref 11.5–14.6)
WBC: 5.5 10*3/uL (ref 4.5–10.5)

## 2013-06-16 LAB — COMPREHENSIVE METABOLIC PANEL
ALT: 24 U/L (ref 0–35)
AST: 18 U/L (ref 0–37)
Albumin: 3.8 g/dL (ref 3.5–5.2)
Alkaline Phosphatase: 147 U/L — ABNORMAL HIGH (ref 39–117)
BUN: 12 mg/dL (ref 6–23)
CO2: 34 mEq/L — ABNORMAL HIGH (ref 19–32)
Calcium: 9.4 mg/dL (ref 8.4–10.5)
Chloride: 100 mEq/L (ref 96–112)
Creatinine, Ser: 1 mg/dL (ref 0.4–1.2)
GFR: 80.22 mL/min (ref >60.00)
Glucose, Bld: 89 mg/dL (ref 70–99)
Potassium: 4.6 mEq/L (ref 3.5–5.1)
Sodium: 138 mEq/L (ref 135–145)
Total Bilirubin: 0.2 mg/dL — ABNORMAL LOW (ref 0.3–1.2)
Total Protein: 7.5 g/dL (ref 6.0–8.3)

## 2013-06-16 LAB — BRAIN NATRIURETIC PEPTIDE: Pro B Natriuretic peptide (BNP): 11 pg/mL (ref 0.0–100.0)

## 2013-06-16 MED ORDER — AZITHROMYCIN 500 MG PO TABS: 500.0000 mg | ORAL_TABLET | Freq: Every day | ORAL | Status: DC

## 2013-06-16 MED ORDER — HYDROCODONE-HOMATROPINE 5-1.5 MG/5ML PO SYRP: 5.0000 mL | ORAL_SOLUTION | Freq: Three times a day (TID) | ORAL | Status: DC | PRN

## 2013-06-16 MED ORDER — METHYLPREDNISOLONE ACETATE 80 MG/ML IJ SUSP
120.0000 mg | Freq: Once | INTRAMUSCULAR | Status: AC
  Administered 2013-06-16: 120 mg via INTRAMUSCULAR

## 2013-06-16 NOTE — Patient Instructions (Signed)

## 2013-06-16 NOTE — Progress Notes (Signed)
Subjective:    Patient ID: [name], female    DOB: [birth year], 38 y.o.   MRN: [id number]  Cough This is a new problem. The current episode started in the past 7 days. The problem has been gradually worsening. The problem occurs every few minutes. The cough is productive of purulent sputum. Associated symptoms include a sore throat, shortness of breath and wheezing. Pertinent negatives include no chest pain, chills, ear congestion, ear pain, fever, headaches, heartburn, hemoptysis, myalgias, nasal congestion, postnasal drip, rash, rhinorrhea, sweats or weight loss. She has tried OTC cough suppressant for the symptoms. The treatment provided no relief. Her past medical history is significant for environmental allergies and pneumonia. There is no history of asthma, bronchiectasis, bronchitis, COPD or emphysema.      Review of Systems  Constitutional: Negative.  Negative for fever, chills, weight loss, diaphoresis, appetite change and fatigue.  HENT: Positive for sore throat. Negative for ear pain, postnasal drip, rhinorrhea, sinus pressure, tinnitus, trouble swallowing and voice change.   Eyes: Negative.   Respiratory: Positive for cough, shortness of breath and wheezing. Negative for apnea, hemoptysis, choking and chest tightness.   Cardiovascular: Negative.  Negative for chest pain, palpitations and leg swelling.  Gastrointestinal: Negative.  Negative for heartburn and abdominal pain.  Endocrine: Negative.   Genitourinary: Positive for vaginal bleeding. Negative for vaginal discharge and vaginal pain.       She has had persistent menstrual bleeding since her LMP 05/24/13  Musculoskeletal: Negative.  Negative for myalgias.  Skin: Negative.  Negative for rash.  Allergic/Immunologic: Positive for environmental allergies.  Neurological: Negative.  Negative for headaches.  Hematological: Negative.  Negative for adenopathy. Does not bruise/bleed easily.  Psychiatric/Behavioral: Negative.          Objective:   Physical Exam  Vitals reviewed. Constitutional: She is oriented to person, place, and time. She appears well-developed and well-nourished. No distress.  HENT:  Head: Normocephalic and atraumatic.  Mouth/Throat: No oropharyngeal exudate.  Eyes: Conjunctivae are normal. Right eye exhibits no discharge. Left eye exhibits no discharge. No scleral icterus.  Neck: Normal range of motion. Neck supple. No JVD present. No tracheal deviation present. No thyromegaly present.  Cardiovascular: Normal rate, regular rhythm, normal heart sounds and intact distal pulses.  Exam reveals no gallop and no friction rub.   No murmur heard. Pulmonary/Chest: Effort normal and breath sounds normal. No accessory muscle usage or stridor. Not tachypneic. No respiratory distress. She has no decreased breath sounds. She has no wheezes. She has no rhonchi. She has no rales. She exhibits no tenderness.  Abdominal: Soft. Bowel sounds are normal. She exhibits no distension and no mass. There is no tenderness. There is no rebound and no guarding.  Musculoskeletal: Normal range of motion. She exhibits no edema and no tenderness.  Lymphadenopathy:    She has no cervical adenopathy.  Neurological: She is oriented to person, place, and time.  Skin: Skin is warm and dry. No rash noted. She is not diaphoretic. No erythema. No pallor.     Lab Results  Component Value Date   WBC 9.4 06/09/2013   HGB 12.1 06/09/2013   HCT 37.3 06/09/2013   PLT 298 06/09/2013   GLUCOSE 96 02/13/2013   CHOL 144 02/15/2013   TRIG 83.0 02/15/2013   HDL 43.30 02/15/2013   LDLCALC 84 02/15/2013   ALT 33 01/13/2007   AST 30 01/13/2007   NA 135 02/13/2013   K 4.5 02/13/2013   CL 105 02/13/2013  CREATININE 1.0 02/13/2013   BUN 11 02/13/2013   CO2 25 02/13/2013   TSH 1.80 12/28/2012       Assessment & Plan:

## 2013-06-16 NOTE — Progress Notes (Signed)
Pre visit review using our clinic review tool, if applicable. No additional management support is needed unless otherwise documented below in the visit note. 

## 2013-06-17 ENCOUNTER — Encounter: Payer: Self-pay | Admitting: Internal Medicine

## 2013-06-17 ENCOUNTER — Other Ambulatory Visit: Payer: Self-pay | Admitting: Internal Medicine

## 2013-06-17 NOTE — Assessment & Plan Note (Signed)
I will check he CXR to look for edema Will also check a BNP to see if she has CHF, pulm edema and will look at her CBC to see if she is symptomatic from the blood loss

## 2013-06-17 NOTE — Assessment & Plan Note (Signed)
She is having a flare up so I gave her an injection of depo-medrol IM

## 2013-06-17 NOTE — Assessment & Plan Note (Signed)
I will check her CXR for mass, edema, PNA

## 2013-06-17 NOTE — Assessment & Plan Note (Signed)
I will treat the infection with Zpak and will control the cough with hycodan 

## 2013-06-20 ENCOUNTER — Encounter: Payer: Self-pay | Admitting: Family Medicine

## 2013-06-20 ENCOUNTER — Ambulatory Visit (INDEPENDENT_AMBULATORY_CARE_PROVIDER_SITE_OTHER): Payer: 59 | Admitting: Family Medicine

## 2013-06-20 VITALS — BP 130/66 | HR 121 | Temp 97.2°F | Resp 18 | Wt 251.1 lb

## 2013-06-20 DIAGNOSIS — M546 Pain in thoracic spine: Secondary | ICD-10-CM | POA: Insufficient documentation

## 2013-06-20 DIAGNOSIS — M999 Biomechanical lesion, unspecified: Secondary | ICD-10-CM
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
## 2013-06-20 NOTE — Patient Instructions (Signed)
Good to see you Continue the posture exercises Change computer screen so you are not looking down. Soup can exercises 3 sets of 15 most days of the week.   Continue medication for now Come back again in 2 weeks.

## 2013-06-20 NOTE — Assessment & Plan Note (Signed)
Decision today to treat with OMT was based on Physical Exam  After verbal consent patient was treated with HVLA, ME techniques in cervical and thoracic areas  Patient tolerated the procedure well with improvement in symptoms  Patient given exercises, stretches and lifestyle modifications  See medications in patient instructions if given  Patient will follow up in 2-3 weeks

## 2013-06-20 NOTE — Progress Notes (Signed)
Pre-visit discussion using our clinic review tool. No additional management support is needed unless otherwise documented below in the visit note.  

## 2013-06-20 NOTE — Assessment & Plan Note (Signed)
Patient x-rays were reviewed by me. 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 Discussed different medications he can be helpful given home exercise program. Patient did respond well to osteopathic manipulation. Patient will come back again in 2-3 weeks.

## 2013-06-27 ENCOUNTER — Telehealth: Payer: Self-pay | Admitting: Cardiology

## 2013-06-27 NOTE — Telephone Encounter (Signed)
Called patient back. She states that she had 14 sessions in cardiac rehab last year but could not return because of other medical issues. She would like to return to restart cardiac rehab. States that her insurance company has advised her that she can return for 36 sessions. Advised, will forward message to Dr. Stanford Breed for approval.

## 2013-06-27 NOTE — Telephone Encounter (Signed)
Ok for rehab [name]  

## 2013-06-27 NOTE — Telephone Encounter (Signed)
New message   Patient calling need a Referral to cardiac rehab .

## 2013-06-28 ENCOUNTER — Encounter: Payer: Self-pay | Admitting: Family Medicine

## 2013-06-28 NOTE — Telephone Encounter (Signed)
Will route to [facility] in cardiac rehab

## 2013-07-01 ENCOUNTER — Other Ambulatory Visit: Payer: Self-pay | Admitting: Internal Medicine

## 2013-07-03 ENCOUNTER — Other Ambulatory Visit (INDEPENDENT_AMBULATORY_CARE_PROVIDER_SITE_OTHER): Payer: 59

## 2013-07-03 ENCOUNTER — Ambulatory Visit (INDEPENDENT_AMBULATORY_CARE_PROVIDER_SITE_OTHER): Payer: 59 | Admitting: Internal Medicine

## 2013-07-03 ENCOUNTER — Encounter: Payer: Self-pay | Admitting: Internal Medicine

## 2013-07-03 ENCOUNTER — Ambulatory Visit (INDEPENDENT_AMBULATORY_CARE_PROVIDER_SITE_OTHER): Payer: 59 | Admitting: Family Medicine

## 2013-07-03 ENCOUNTER — Encounter: Payer: Self-pay | Admitting: Family Medicine

## 2013-07-03 VITALS — BP 132/98 | HR 119 | Temp 98.8°F | Resp 18 | Wt 254.0 lb

## 2013-07-03 VITALS — BP 132/84 | HR 119 | Temp 98.8°F | Wt 254.4 lb

## 2013-07-03 DIAGNOSIS — N938 Other specified abnormal uterine and vaginal bleeding: Secondary | ICD-10-CM

## 2013-07-03 DIAGNOSIS — R7989 Other specified abnormal findings of blood chemistry: Secondary | ICD-10-CM

## 2013-07-03 DIAGNOSIS — R945 Abnormal results of liver function studies: Secondary | ICD-10-CM

## 2013-07-03 DIAGNOSIS — M546 Pain in thoracic spine: Secondary | ICD-10-CM

## 2013-07-03 DIAGNOSIS — N949 Unspecified condition associated with female genital organs and menstrual cycle: Secondary | ICD-10-CM

## 2013-07-03 DIAGNOSIS — N925 Other specified irregular menstruation: Secondary | ICD-10-CM

## 2013-07-03 DIAGNOSIS — M9981 Other biomechanical lesions of cervical region: Secondary | ICD-10-CM

## 2013-07-03 DIAGNOSIS — I10 Essential (primary) hypertension: Secondary | ICD-10-CM

## 2013-07-03 DIAGNOSIS — E669 Obesity, unspecified: Secondary | ICD-10-CM

## 2013-07-03 DIAGNOSIS — M999 Biomechanical lesion, unspecified: Secondary | ICD-10-CM

## 2013-07-03 LAB — HEPATIC FUNCTION PANEL
ALT: 16 U/L (ref 0–35)
AST: 15 U/L (ref 0–37)
Albumin: 3.6 g/dL (ref 3.5–5.2)
Alkaline Phosphatase: 106 U/L (ref 39–117)
Bilirubin, Direct: 0 mg/dL (ref 0.0–0.3)
Total Bilirubin: 0.4 mg/dL (ref 0.3–1.2)
Total Protein: 7.3 g/dL (ref 6.0–8.3)

## 2013-07-03 MED ORDER — PHENTERMINE HCL 37.5 MG PO CAPS
37.5000 mg | ORAL_CAPSULE | ORAL | Status: DC
Start: 2013-07-03 — End: 2013-09-29

## 2013-07-03 NOTE — Assessment & Plan Note (Signed)
BP Readings from Last 3 Encounters:  07/03/13 132/84  06/20/13 130/66  06/16/13 126/88   Resistant to med tx On ARB, amlodipine, coreg, and now lasix since 12/2012 hosp for CHF exac The current medical regimen is effective;  continue present plan and medications.

## 2013-07-03 NOTE — Assessment & Plan Note (Signed)
remotely on phentermine for same with good, short term results Discussed pros and cons and alternative methods of weight loss, patient wishes to resume short-term phentermine Prescription provided today 1st 07/03/13; nurse visit in one month to recheck and monitor progression reported loss Wt Readings from Last 3 Encounters:  07/03/13 254 lb 6.4 oz (115.395 kg)  06/20/13 251 lb 1.3 oz (113.889 kg)  06/16/13 255 lb (115.667 kg)   The patient is asked to make an attempt to improve diet and exercise patterns to aid in medical management of this problem.

## 2013-07-03 NOTE — Progress Notes (Signed)
  CC:  Upper back pain and manipulation.   HPI: Patient is here for followup of her upper back and neck pain. Patient did have manipulation at last visit and stated that she did have some significant improvement. Unfortunately the course last 72 hours she felt that the area popped out again. Patient has been trying to do the exercises, working on posture exercises, and sleeping position.   Past medical, surgical, family and social history reviewed. Medications reviewed all in the electronic medical record.   Review of Systems: No headache, visual changes, nausea, vomiting, diarrhea, constipation, dizziness, abdominal pain, skin rash, fevers, chills, night sweats, weight loss, swollen lymph nodes, body aches, joint swelling, muscle aches, chest pain, shortness of breath, mood changes.   Objective:    Blood pressure 132/98, pulse 119, temperature 98.8 F (37.1 C), temperature source Oral, resp. rate 18, weight 254 lb (115.214 kg), last menstrual period 05/23/2013, SpO2 96.00%.   General: No apparent distress alert and oriented x3 mood and affect normal, dressed appropriately.  HEENT: Pupils equal, extraocular movements intact Respiratory: [name] speak in full sentences and does not appear short of breath Cardiovascular: No lower extremity edema, non tender, no erythema Skin: Warm dry intact with no signs of infection or rash on extremities or on axial skeleton. Abdomen: Soft nontender Neuro: Cranial nerves II through XII are intact, neurovascularly intact in all extremities with 2+ DTRs and 2+ pulses. Lymph: No lymphadenopathy of posterior or anterior cervical chain or axillae bilaterally.  Gait normal with good balance and coordination.  MSK: Non tender with full range of motion and good stability and symmetric strength and tone of  Shoulders, elbows, wrist, hip, knee and ankles bilaterally.  Neck: Inspection unremarkable. No palpable stepoffs. Negative Spurling's maneuver. Full neck  range of motion Grip strength and sensation normal in bilateral hands Strength good C4 to T1 distribution No sensory change to C4 to T1 Negative Hoffman sign bilaterally Reflexes normal Patient does have spasm in the trapezius muscle on the right side mostly over the T5 area  Osteopathic findings Cervical C4 flexed rotated and side bent right C6 flexed rotated and side bent left  Thoracic T5 extended rotated inside that right T8 extended rotated and side bent left  Lumbar L2 flexed rotated inside that right  Sacrum Left on left  Impression and Recommendations:     This case required medical decision making of moderate complexity.

## 2013-07-03 NOTE — Progress Notes (Signed)
Pre-visit discussion using our clinic review tool. No additional management support is needed unless otherwise documented below in the visit note.  

## 2013-07-03 NOTE — Patient Instructions (Signed)
Good to see you Continue with what you are doing.  Ibuprofen as you need it.  Continue with the tennis balls Lets try again in 2 weeks.

## 2013-07-03 NOTE — Progress Notes (Signed)
Subjective:    Patient ID: [name], female    DOB: July 28, 1975, 38 y.o.   MRN: [id number]  HPI  Patient is here for follow up  Reviewed chronic medical issues and interval medical events  Past Medical History  Diagnosis Date  . Hypertension   . Ovarian cyst     1999; surgically removed  . OSA (obstructive sleep apnea)     on CPAP   . IBS (irritable bowel syndrome)   . 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 Depression   . Anxiety   . Fibroid     age 14  . Termination of pregnancy     due to cardiac risk  . DVT (deep venous thrombosis)     2014    Review of Systems  Constitutional: Positive for unexpected weight change. Negative for fever and fatigue.  Respiratory: Negative for cough and shortness of breath.   Cardiovascular: Negative for chest pain and leg swelling.  Gastrointestinal: Negative for nausea, vomiting and abdominal pain.  Genitourinary: Positive for menstrual problem.       Objective:   Physical Exam  BP 132/84  Pulse 119  Temp(Src) 98.8 F (37.1 C) (Oral)  Wt 254 lb 6.4 oz (115.395 kg)  SpO2 96%  LMP 05/23/2013 Wt Readings from Last 3 Encounters:  07/03/13 254 lb 6.4 oz (115.395 kg)  06/20/13 251 lb 1.3 oz (113.889 kg)  06/16/13 255 lb (115.667 kg)    Constitutional: She overweight, but appears well-developed and well-nourished. No distress.  Neck: Normal range of motion. Neck supple. No JVD present. No thyromegaly present.  Cardiovascular: Normal rate, regular rhythm and normal heart sounds.  No murmur heard. No BLE edema. Pulmonary/Chest: Effort normal and breath sounds normal. No respiratory distress. She has no wheezes.  Abdomen: SNTND, +BS GU: defer to gyn Psychiatric: She has a normal mood and affect. Her behavior is normal. Judgment and thought content normal.   Lab Results  Component Value Date   WBC 5.5 06/16/2013   HGB 12.8 06/16/2013   HCT 38.2 06/16/2013   PLT 246.0 06/16/2013   GLUCOSE 89  06/16/2013   CHOL 144 02/15/2013   TRIG 83.0 02/15/2013   HDL 43.30 02/15/2013   LDLCALC 84 02/15/2013   ALT 24 06/16/2013   AST 18 06/16/2013   NA 138 06/16/2013   K 4.6 06/16/2013   CL 100 06/16/2013   CREATININE 1.0 06/16/2013   BUN 12 06/16/2013   CO2 34* 06/16/2013   TSH 1.80 12/28/2012    Dg Chest 2 View  06/16/2013   CLINICAL DATA:  Cough, shortness of Breath  EXAM: CHEST  2 VIEW  COMPARISON:  12/30/2012  FINDINGS: Cardiomediastinal silhouette is stable. No acute infiltrate or pleural effusion. No pulmonary edema. Mild degenerative changes thoracic spine.  IMPRESSION: No active cardiopulmonary disease.   Electronically Signed   By: Lahoma Crocker M.D.   On: 06/16/2013 16:12       Assessment & Plan:   Mild abnormal Alk phos - no GI symptoms - recheck today  DUB - [facility] reviewed - for gyn follow up to consider DepoProvera or other tx  Problem List Items Addressed This Visit   DUB (dysfunctional uterine bleeding) - Primary   Relevant Orders      Ambulatory referral to Obstetrics / Gynecology   Hypertension      BP Readings from Last 3 Encounters:  07/03/13 132/84  06/20/13 130/66  06/16/13  126/88   Resistant to med tx On ARB, amlodipine, coreg, and now lasix since 12/2012 hosp for CHF exac The current medical regimen is effective;  continue present plan and medications.    Obese      remotely on phentermine for same with good, short term results Discussed pros and cons and alternative methods of weight loss, patient wishes to resume short-term phentermine Prescription provided today 1st 07/03/13; nurse visit in one month to recheck and monitor progression reported loss Wt Readings from Last 3 Encounters:  07/03/13 254 lb 6.4 oz (115.395 kg)  06/20/13 251 lb 1.3 oz (113.889 kg)  06/16/13 255 lb (115.667 kg)   The patient is asked to make an attempt to improve diet and exercise patterns to aid in medical management of this problem.       Relevant  Medications      phentermine capsule    Other Visit Diagnoses   Abnormal LFTs        Relevant Orders       Hepatic function panel

## 2013-07-03 NOTE — Assessment & Plan Note (Signed)
Decision today to treat with OMT was based on Physical Exam  After verbal consent patient was treated with HVLA ane ME techniques in cervical, thoracic, lumbar and sacral areas  Patient tolerated the procedure well with improvement in symptoms  Patient given exercises, stretches and lifestyle modifications  See medications in patient instructions if given  Patient will follow up in 2 weeks

## 2013-07-03 NOTE — Patient Instructions (Addendum)
It was good to see you today.  We have reviewed your prior records including labs and tests today  Test(s) ordered today. Your results will be released to [place] (or called to you) after review, usually within 72hours after test completion. If any changes need to be made, you will be notified at that same time.  we'll make referral to Todd, nurse practitioner with gynecology Our office will contact you regarding appointment(s) once made.  Will use Phentermine to help you reach your weight loss goals - today prescription for 1st of 3 months provided - If side effects or other problems, please stop medication and call us.  Please return in 1 month (nurse visit for weight check) before refill will be given  Please schedule followup in 3 months for visit with me to recheck weight and review, call sooner if problems.  Exercise to Lose Weight Exercise and a healthy diet may help you lose weight. Your doctor may suggest specific exercises. EXERCISE IDEAS AND TIPS  Choose low-cost things you enjoy doing, such as walking, bicycling, or exercising to workout videos.  Take stairs instead of the elevator.  Walk during your lunch break.  Park your car further away from work or school.  Go to a gym or an exercise class.  Start with 5 to 10 minutes of exercise each day. Build up to 30 minutes of exercise 4 to 6 days a week.  Wear shoes with good support and comfortable clothes.  Stretch before and after working out.  Work out until you breathe harder and your heart beats faster.  Drink extra water when you exercise.  Do not do so much that you hurt yourself, feel dizzy, or get very short of breath. Exercises that burn about 150 calories:  Running 1  miles in 15 minutes.  Playing volleyball for 45 to 60 minutes.  Washing and waxing a car for 45 to 60 minutes.  Playing touch football for 45 minutes.  Walking 1  miles in 35 minutes.  Pushing a stroller 1  miles in 30  minutes.  Playing basketball for 30 minutes.  Raking leaves for 30 minutes.  Bicycling 5 miles in 30 minutes.  Walking 2 miles in 30 minutes.  Dancing for 30 minutes.  Shoveling snow for 15 minutes.  Swimming laps for 20 minutes.  Walking up stairs for 15 minutes.  Bicycling 4 miles in 15 minutes.  Gardening for 30 to 45 minutes.  Jumping rope for 15 minutes.  Washing windows or floors for 45 to 60 minutes. Document Released: 06/13/2010 Document Revised: 08/03/2011 Document Reviewed: 06/13/2010 [facility] Patient Information 2014 [place], [place].

## 2013-07-03 NOTE — Assessment & Plan Note (Signed)
Patient is responding very well to osteopathic manipulation. Encourage her to continue with the exercises including postural exercises. Patient has made improvement and manipulation today which sees year. Discuss potentially trying to get patient out 22-3 weeks for followup.

## 2013-07-04 ENCOUNTER — Encounter: Payer: Self-pay | Admitting: Internal Medicine

## 2013-07-13 ENCOUNTER — Telehealth: Payer: Self-pay | Admitting: Cardiology

## 2013-07-13 NOTE — Telephone Encounter (Signed)
New message     Did we send a referral to cardiac rehab for the patient?

## 2013-07-13 NOTE — Telephone Encounter (Signed)
Left message for pt, aware I sent a note to [facility] in rehab through [facility]. Will make sure note is faxed.

## 2013-07-17 ENCOUNTER — Ambulatory Visit (INDEPENDENT_AMBULATORY_CARE_PROVIDER_SITE_OTHER): Payer: 59 | Admitting: Family Medicine

## 2013-07-17 ENCOUNTER — Other Ambulatory Visit: Payer: Self-pay | Admitting: Internal Medicine

## 2013-07-17 ENCOUNTER — Encounter: Payer: Self-pay | Admitting: Family Medicine

## 2013-07-17 VITALS — BP 138/82 | HR 69 | Temp 98.3°F | Resp 16 | Wt 253.8 lb

## 2013-07-17 DIAGNOSIS — M546 Pain in thoracic spine: Secondary | ICD-10-CM

## 2013-07-17 DIAGNOSIS — M999 Biomechanical lesion, unspecified: Secondary | ICD-10-CM

## 2013-07-17 DIAGNOSIS — M9981 Other biomechanical lesions of cervical region: Secondary | ICD-10-CM

## 2013-07-17 NOTE — Patient Instructions (Signed)
Good to see you Continue exercises 3 times a week. Add in the shoulder ones again.  Posture.  Come back again in 3 weeks.

## 2013-07-17 NOTE — Assessment & Plan Note (Signed)
Decision today to treat with OMT was based on Physical Exam  After verbal consent patient was treated with HVLA, ME, FPR techniques in cervical, thoracic and lumbar areas  Patient tolerated the procedure well with improvement in symptoms  Patient given exercises, stretches and lifestyle modifications  See medications in patient instructions if given  Patient will follow up in 3 weeks, hopefully a next visit we can to 4-6 weeks. 

## 2013-07-17 NOTE — Assessment & Plan Note (Signed)
Decision today to treat with OMT was based on Physical Exam  After verbal consent patient was treated with HVLA, ME, FPR techniques in cervical, thoracic and lumbar areas  Patient tolerated the procedure well with improvement in symptoms  Patient given exercises, stretches and lifestyle modifications  See medications in patient instructions if given  Patient will follow up in 3 weeks, hopefully a next visit we can to 4-6 weeks.

## 2013-07-17 NOTE — Progress Notes (Signed)
  CC:  Upper back pain and manipulation.   HPI: Patient is here for followup of her upper back and neck pain. Patient did have manipulation at last visit and continues to improve. Patient has not been doing unfortunately the exercises on a regular basis and is not doing any of the postural exercises. Patient was at a nursing care last night secondary to her niece. Patient unfortunately had to sleep in a chair notice some significant discomfort of the lower spine as well. Patient denies any other new symptoms..   Past medical, surgical, family and social history reviewed. Medications reviewed all in the electronic medical record.   Review of Systems: No headache, visual changes, nausea, vomiting, diarrhea, constipation, dizziness, abdominal pain, skin rash, fevers, chills, night sweats, weight loss, swollen lymph nodes, body aches, joint swelling, muscle aches, chest pain, shortness of breath, mood changes.   Objective:    Blood pressure 138/82, pulse 69, temperature 98.3 F (36.8 C), temperature source Oral, resp. rate 16, weight 253 lb 12.8 oz (115.123 kg), SpO2 97.00%.   General: No apparent distress alert and oriented x3 mood and affect normal, dressed appropriately.  HEENT: Pupils equal, extraocular movements intact Respiratory: [name] speak in full sentences and does not appear short of breath Cardiovascular: No lower extremity edema, non tender, no erythema Skin: Warm dry intact with no signs of infection or rash on extremities or on axial skeleton. Abdomen: Soft nontender Neuro: Cranial nerves II through XII are intact, neurovascularly intact in all extremities with 2+ DTRs and 2+ pulses. Lymph: No lymphadenopathy of posterior or anterior cervical chain or axillae bilaterally.  Gait normal with good balance and coordination.  MSK: Non tender with full range of motion and good stability and symmetric strength and tone of  Shoulders, elbows, wrist, hip, knee and ankles bilaterally.   Neck: Inspection unremarkable. No palpable stepoffs. Negative Spurling's maneuver. Full neck range of motion Grip strength and sensation normal in bilateral hands Strength good C4 to T1 distribution No sensory change to C4 to T1 Negative Hoffman sign bilaterally Reflexes normal Patient does have spasm in the trapezius muscle on the right side mostly over the T5 area  Osteopathic findings Cervical C2 flexed rotated and side bent left C4 flexed rotated and side bent right  Thoracic T5 extended rotated inside that right T10 extended rotated and side bent left  Lumbar L2 flexed rotated inside that right  Sacrum Left on left  Impression and Recommendations:     This case required medical decision making of moderate complexity.

## 2013-07-17 NOTE — Progress Notes (Signed)
Pre visit review using our clinic review tool, if applicable. No additional management support is needed unless otherwise documented below in the visit note. 

## 2013-07-17 NOTE — Assessment & Plan Note (Signed)
Still secondary to patient's muscle weakness. Patient was given a trial of a new anti-inflammatory. Patient discussed the importance of different postural changes as well as weight loss. Patient is responding to manipulation will continue on a fairly regular basis.

## 2013-07-19 ENCOUNTER — Encounter: Payer: Self-pay | Admitting: Gastroenterology

## 2013-07-19 ENCOUNTER — Encounter (HOSPITAL_COMMUNITY): Payer: Self-pay | Admitting: Emergency Medicine

## 2013-07-19 ENCOUNTER — Emergency Department (HOSPITAL_COMMUNITY): Payer: 59

## 2013-07-19 ENCOUNTER — Telehealth: Payer: Self-pay

## 2013-07-19 ENCOUNTER — Emergency Department (HOSPITAL_COMMUNITY)
Admission: EM | Admit: 2013-07-19 | Discharge: 2013-07-19 | Disposition: A | Discharge status: Home / Self Care | Payer: 59 | Attending: Emergency Medicine | Admitting: Emergency Medicine

## 2013-07-19 DIAGNOSIS — R109 Unspecified abdominal pain: Secondary | ICD-10-CM

## 2013-07-19 DIAGNOSIS — F411 Generalized anxiety disorder: Secondary | ICD-10-CM | POA: Insufficient documentation

## 2013-07-19 DIAGNOSIS — G4733 Obstructive sleep apnea (adult) (pediatric): Secondary | ICD-10-CM | POA: Insufficient documentation

## 2013-07-19 DIAGNOSIS — IMO0002 Reserved for concepts with insufficient information to code with codable children: Secondary | ICD-10-CM | POA: Insufficient documentation

## 2013-07-19 DIAGNOSIS — Z79899 Other long term (current) drug therapy: Secondary | ICD-10-CM | POA: Insufficient documentation

## 2013-07-19 DIAGNOSIS — I10 Essential (primary) hypertension: Secondary | ICD-10-CM | POA: Insufficient documentation

## 2013-07-19 DIAGNOSIS — Z8742 Personal history of other diseases of the female genital tract: Secondary | ICD-10-CM | POA: Insufficient documentation

## 2013-07-19 DIAGNOSIS — R1031 Right lower quadrant pain: Secondary | ICD-10-CM | POA: Insufficient documentation

## 2013-07-19 DIAGNOSIS — Z9089 Acquired absence of other organs: Secondary | ICD-10-CM | POA: Insufficient documentation

## 2013-07-19 DIAGNOSIS — F329 Major depressive disorder, single episode, unspecified: Secondary | ICD-10-CM | POA: Insufficient documentation

## 2013-07-19 DIAGNOSIS — F3289 Other specified depressive episodes: Secondary | ICD-10-CM | POA: Insufficient documentation

## 2013-07-19 DIAGNOSIS — Z9889 Other specified postprocedural states: Secondary | ICD-10-CM | POA: Insufficient documentation

## 2013-07-19 DIAGNOSIS — Z3202 Encounter for pregnancy test, result negative: Secondary | ICD-10-CM | POA: Insufficient documentation

## 2013-07-19 DIAGNOSIS — Z86718 Personal history of other venous thrombosis and embolism: Secondary | ICD-10-CM | POA: Insufficient documentation

## 2013-07-19 DIAGNOSIS — K589 Irritable bowel syndrome without diarrhea: Secondary | ICD-10-CM | POA: Insufficient documentation

## 2013-07-19 LAB — URINALYSIS, ROUTINE W REFLEX MICROSCOPIC
Bilirubin Urine: NEGATIVE
Glucose, UA: NEGATIVE mg/dL
Ketones, ur: 15 mg/dL — AB
Leukocytes, UA: NEGATIVE
Nitrite: NEGATIVE
Protein, ur: NEGATIVE mg/dL
Specific Gravity, Urine: 1.02 (ref 1.005–1.030)
Urobilinogen, UA: 0.2 mg/dL (ref 0.0–1.0)
pH: 7 (ref 5.0–8.0)

## 2013-07-19 LAB — COMPREHENSIVE METABOLIC PANEL
ALT: 15 U/L (ref 0–35)
AST: 20 U/L (ref 0–37)
Albumin: 3.5 g/dL (ref 3.5–5.2)
Alkaline Phosphatase: 101 U/L (ref 39–117)
BUN: 8 mg/dL (ref 6–23)
CO2: 25 mEq/L (ref 19–32)
Calcium: 9.5 mg/dL (ref 8.4–10.5)
Chloride: 101 mEq/L (ref 96–112)
Creatinine, Ser: 0.81 mg/dL (ref 0.50–1.10)
GFR calc Af Amer: >90 mL/min (ref >90)
GFR calc non Af Amer: >90 mL/min (ref >90)
Glucose, Bld: 94 mg/dL (ref 70–99)
Potassium: 3.8 mEq/L (ref 3.7–5.3)
Sodium: 140 mEq/L (ref 137–147)
Total Bilirubin: 0.3 mg/dL (ref 0.3–1.2)
Total Protein: 7.8 g/dL (ref 6.0–8.3)

## 2013-07-19 LAB — CBC WITH DIFFERENTIAL/PLATELET
Basophils Absolute: 0 10*3/uL (ref 0.0–0.1)
Basophils Relative: 0 % (ref 0–1)
Eosinophils Absolute: 0 10*3/uL (ref 0.0–0.7)
Eosinophils Relative: 0 % (ref 0–5)
HCT: 37.5 % (ref 36.0–46.0)
Hemoglobin: 12.4 g/dL (ref 12.0–15.0)
Lymphocytes Relative: 25 % (ref 12–46)
Lymphs Abs: 2.8 10*3/uL (ref 0.7–4.0)
MCH: 29.6 pg (ref 26.0–34.0)
MCHC: 33.1 g/dL (ref 30.0–36.0)
MCV: 89.5 fL (ref 78.0–100.0)
Monocytes Absolute: 0.6 10*3/uL (ref 0.1–1.0)
Monocytes Relative: 6 % (ref 3–12)
Neutro Abs: 7.4 10*3/uL (ref 1.7–7.7)
Neutrophils Relative %: 69 % (ref 43–77)
Platelets: 254 10*3/uL (ref 150–400)
RBC: 4.19 MIL/uL (ref 3.87–5.11)
RDW: 14.4 % (ref 11.5–15.5)
WBC: 10.8 10*3/uL — ABNORMAL HIGH (ref 4.0–10.5)

## 2013-07-19 LAB — I-STAT CHEM 8, ED
BUN: 7 mg/dL (ref 6–23)
Calcium, Ion: 1.19 mmol/L (ref 1.12–1.23)
Chloride: 102 mEq/L (ref 96–112)
Creatinine, Ser: 0.9 mg/dL (ref 0.50–1.10)
Glucose, Bld: 96 mg/dL (ref 70–99)
HCT: 39 % (ref 36.0–46.0)
Hemoglobin: 13.3 g/dL (ref 12.0–15.0)
Potassium: 3.5 mEq/L — ABNORMAL LOW (ref 3.7–5.3)
Sodium: 140 mEq/L (ref 137–147)
TCO2: 26 mmol/L (ref 0–100)

## 2013-07-19 LAB — POC URINE PREG, ED: Preg Test, Ur: NEGATIVE

## 2013-07-19 LAB — LIPASE, BLOOD: Lipase: 21 U/L (ref 11–59)

## 2013-07-19 LAB — URINE MICROSCOPIC-ADD ON

## 2013-07-19 MED ORDER — HYDROMORPHONE HCL PF 1 MG/ML IJ SOLN
1.0000 mg | Freq: Once | INTRAMUSCULAR | Status: AC
  Administered 2013-07-19: 1 mg via INTRAVENOUS
  Filled 2013-07-19: qty 1

## 2013-07-19 MED ORDER — SODIUM CHLORIDE 0.9 % IV BOLUS (SEPSIS)
1000.0000 mL | Freq: Once | INTRAVENOUS | Status: AC
  Administered 2013-07-19: 1000 mL via INTRAVENOUS

## 2013-07-19 MED ORDER — PANTOPRAZOLE SODIUM 20 MG PO TBEC: 20.0000 mg | DELAYED_RELEASE_TABLET | Freq: Every day | ORAL | Status: DC

## 2013-07-19 MED ORDER — ONDANSETRON HCL 4 MG/2ML IJ SOLN
4.0000 mg | Freq: Once | INTRAMUSCULAR | Status: AC
  Administered 2013-07-19: 4 mg via INTRAVENOUS
  Filled 2013-07-19: qty 2

## 2013-07-19 MED ORDER — SUCRALFATE 1 G PO TABS: 1.0000 g | ORAL_TABLET | Freq: Four times a day (QID) | ORAL | Status: DC

## 2013-07-19 MED ORDER — FENTANYL CITRATE 0.05 MG/ML IJ SOLN
100.0000 ug | Freq: Once | INTRAMUSCULAR | Status: AC
  Administered 2013-07-19: 100 ug via INTRAVENOUS
  Filled 2013-07-19: qty 2

## 2013-07-19 NOTE — Telephone Encounter (Signed)
Refer done.

## 2013-07-19 NOTE — ED Notes (Signed)
Pt resting on stretcher; family at bedside

## 2013-07-19 NOTE — ED Notes (Signed)
Patient complaining of pain 9/10. Dr. Sabra Heck in a meeting at this time. Patient made aware.

## 2013-07-19 NOTE — ED Notes (Addendum)
Patient comfortable. Waiting on MD.

## 2013-07-19 NOTE — Discharge Instructions (Signed)
Abdominal Pain, Adult °Many things can cause abdominal pain. Usually, abdominal pain is not caused by a disease and will improve without treatment. It can often be observed and treated at home. Your health care provider will do a physical exam and possibly order blood tests and X-rays to help determine the seriousness of your pain. However, in many cases, more time must pass before a clear cause of the pain can be found. Before that point, your health care provider may not know if you need more testing or further treatment. °HOME CARE INSTRUCTIONS  °Monitor your abdominal pain for any changes. The following actions may help to alleviate any discomfort you are experiencing: °· Only take over-the-counter or prescription medicines as directed by your health care provider. °· Do not take laxatives unless directed to do so by your health care provider. °· Try a clear liquid diet (broth, tea, or water) as directed by your health care provider. Slowly move to a bland diet as tolerated. °SEEK MEDICAL CARE IF: °· You have unexplained abdominal pain. °· You have abdominal pain associated with nausea or diarrhea. °· You have pain when you urinate or have a bowel movement. °· You experience abdominal pain that wakes you in the night. °· You have abdominal pain that is worsened or improved by eating food. °· You have abdominal pain that is worsened with eating fatty foods. °SEEK IMMEDIATE MEDICAL CARE IF:  °· Your pain does not go away within 2 hours. °· You have a fever. °· You keep throwing up (vomiting). °· Your pain is felt only in portions of the abdomen, such as the right side or the left lower portion of the abdomen. °· You pass bloody or black tarry stools. °MAKE SURE YOU: °· Understand these instructions.   °· Will watch your condition.   °· Will get help right away if you are not doing well or get worse.   °Document Released: 02/18/2005 Document Revised: 03/01/2013 Document Reviewed: 01/18/2013 °ExitCare® Patient  Information ©2014 [employer]. ° °Gastritis, Adult °Gastritis is soreness and swelling (inflammation) of the lining of the stomach. Gastritis can develop as a sudden onset (acute) or long-term (chronic) condition. If gastritis is not treated, it can lead to stomach bleeding and ulcers. °CAUSES  °Gastritis occurs when the stomach lining is weak or damaged. Digestive juices from the stomach then inflame the weakened stomach lining. The stomach lining may be weak or damaged due to viral or bacterial infections. One common bacterial infection is the Helicobacter pylori infection. Gastritis can also result from excessive alcohol consumption, taking certain medicines, or having too much acid in the stomach.  °SYMPTOMS  °In some cases, there are no symptoms. When symptoms are present, they may include: °· Pain or a burning sensation in the upper abdomen. °· Nausea. °· Vomiting. °· An uncomfortable feeling of fullness after eating. °DIAGNOSIS  °Your caregiver may suspect you have gastritis based on your symptoms and a physical exam. To determine the cause of your gastritis, your caregiver may perform the following: °· Blood or stool tests to check for the H pylori bacterium. °· Gastroscopy. A thin, flexible tube (endoscope) is passed down the esophagus and into the stomach. The endoscope has a light and camera on the end. Your caregiver uses the endoscope to view the inside of the stomach. °· Taking a tissue sample (biopsy) from the stomach to examine under a microscope. °TREATMENT  °Depending on the cause of your gastritis, medicines may be prescribed. If you have a bacterial infection, such   as an H pylori infection, antibiotics may be given. If your gastritis is caused by too much acid in the stomach, H2 blockers or antacids may be given. Your caregiver may recommend that you stop taking aspirin, ibuprofen, or other nonsteroidal anti-inflammatory drugs (NSAIDs). °HOME CARE INSTRUCTIONS °· Only take over-the-counter or  prescription medicines as directed by your caregiver. °· If you were given antibiotic medicines, take them as directed. Finish them even if you start to feel better. °· Drink enough fluids to keep your urine clear or pale yellow. °· Avoid foods and drinks that make your symptoms worse, such as: °· Caffeine or alcoholic drinks. °· Chocolate. °· Peppermint or mint flavorings. °· Garlic and onions. °· Spicy foods. °· Citrus fruits, such as oranges, lemons, or limes. °· Tomato-based foods such as sauce, chili, salsa, and pizza. °· Fried and fatty foods. °· Eat small, frequent meals instead of large meals. °SEEK IMMEDIATE MEDICAL CARE IF:  °· You have black or dark red stools. °· You vomit blood or material that looks like coffee grounds. °· You are unable to keep fluids down. °· Your abdominal pain gets worse. °· You have a fever. °· You do not feel better after 1 week. °· You have any other questions or concerns. °MAKE SURE YOU: °· Understand these instructions. °· Will watch your condition. °· Will get help right away if you are not doing well or get worse. °Document Released: 05/05/2001 Document Revised: 11/10/2011 Document Reviewed: 06/24/2011 °ExitCare® Patient Information ©2014 [employer]. ° °

## 2013-07-19 NOTE — ED Provider Notes (Signed)
CSN: [id number]     Arrival date & time 07/19/13  2751 History   First MD Initiated Contact with Patient 07/19/13 [phone]     Chief Complaint  Patient presents with  . 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 She spent 2 months in the hospital at that time and underwent acute renal failure secondary to intravenous dye. Since that time she has done very well without any significant complications or abdominal pain. She presents today with a complaint of right-sided abdominal pain which has been present for 3 days, it is gradually worsening and is severe. She denies any vomiting but has mild nausea which has prevented her from having any oral intake of solid food in the last 24 hours. There is no change in her bowel movements, no change in her urinary habits and she denies dysuria fevers or chills. The pain is located in the right midabdomen, radiates to the right upper quadrant and somewhat to the right lower quadrant. She denies any jaundice or hematuria.  She states that the pain is worse with any movement including change of position such as changing from sitting to standing or standing to sitting.  Patient is a 38 y.o. female presenting with abdominal pain. The history is provided by the patient, the spouse and medical records.  Abdominal Pain   Past Medical History  Diagnosis Date  . Hypertension   . Ovarian cyst     1999; surgically removed  . OSA (obstructive sleep apnea)     on CPAP   . IBS (irritable bowel syndrome)   . 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 Depression   . Anxiety   . Fibroid     age 49  . Termination of pregnancy     due to  cardiac risk  . DVT (deep venous thrombosis)     2014   Past Surgical History  Procedure Laterality Date  . Abdominal surgery    . Cesarean section    . Cholecystectomy    . Foot surgery    . Tubal ligation  1999   Family History  Problem Relation Age of Onset  . Emphysema Maternal Grandmother     smoked  . Allergies Daughter   . Heart disease Maternal Grandmother 29    MI  . Rheum arthritis Mother    History  Substance Use Topics  . Smoking status: Never Smoker   . Smokeless tobacco: Never Used  . Alcohol Use: No   OB History   Grav Para Term Preterm Abortions TAB SAB Ect Mult Living   2 1   1 1    1      Review of Systems  Gastrointestinal: Positive for abdominal pain.  All other systems reviewed and are negative.      Allergies  Ivp dye; Vancomycin; Aspirin; Ciprofloxacin; and Sulfa antibiotics  Home Medications   Current Outpatient Rx  Name  Route  Sig  Dispense  Refill  . Armodafinil (NUVIGIL) 250 MG tablet   Oral   Take 250 mg by mouth daily.         Marland Kitchen buPROPion (WELLBUTRIN XL) 150 MG 24 hr tablet   Oral   Take 450 mg by mouth daily.         Marland Kitchen  carvedilol (COREG) 25 MG tablet   Oral   Take 1 tablet (25 mg total) by mouth 2 (two) times daily with a meal.   60 tablet   12   . cholecalciferol (VITAMIN D) 1000 UNITS tablet   Oral   Take 1,000 Units by mouth daily.         . colesevelam (WELCHOL) 625 MG tablet   Oral   Take 625 mg by mouth daily.          . cyclobenzaprine (FLEXERIL) 5 MG tablet      TAKE 1 TABLET (5 MG TOTAL) BY MOUTH EVERY 8 (EIGHT) HOURS AS NEEDED FOR MUSCLE SPASMS.   30 tablet   1   . diazepam (VALIUM) 10 MG tablet   Oral   Take 10 mg by mouth at bedtime.          . dicyclomine (BENTYL) 10 MG capsule      TAKE 1 CAPSULE (10 MG TOTAL) BY MOUTH 3 (THREE) TIMES DAILY AS NEEDED (FOR ABDOMINAL CRAMPING).   30 capsule   0   . FLUoxetine (PROZAC) 20 MG capsule   Oral   Take 1 capsule by mouth every  morning.         . fluticasone (FLONASE) 50 MCG/ACT nasal spray   Each Nare   Place 1 spray into both nostrils at bedtime. Before donning CPAP   16 g   2   . furosemide (LASIX) 40 MG tablet   Oral   Take 1 tablet (40 mg total) by mouth 2 (two) times daily.   60 tablet   12   . gabapentin (NEURONTIN) 100 MG capsule   Oral   Take 1 capsule (100 mg total) by mouth 2 (two) times daily.   60 capsule   3   . gabapentin (NEURONTIN) 300 MG capsule      Daily at bedtime.   30 capsule   3   . ibuprofen (ADVIL,MOTRIN) 800 MG tablet   Oral   Take 1 tablet (800 mg total) by mouth 3 (three) times daily.   15 tablet   0   . losartan (COZAAR) 100 MG tablet   Oral   Take 100 mg by mouth daily.         . meclizine (ANTIVERT) 25 MG tablet   Oral   Take 1 tablet (25 mg total) by mouth 3 (three) times daily as needed for dizziness or nausea.   30 tablet   0   . meloxicam (MOBIC) 15 MG tablet   Oral   Take 1 tablet (15 mg total) by mouth daily.   30 tablet   0   . ondansetron (ZOFRAN-ODT) 4 MG disintegrating tablet   Oral   Take 1 tablet (4 mg total) by mouth every 8 (eight) hours as needed.   20 tablet   0   . phentermine 37.5 MG capsule   Oral   Take 1 capsule (37.5 mg total) by mouth every morning.   30 capsule   0   . polyethylene glycol powder (GLYCOLAX/MIRALAX) powder   Oral   Take 17 g by mouth 2 (two) times daily as needed.   3350 g   1   . potassium chloride SA (K-DUR,KLOR-CON) 20 MEQ tablet   Oral   Take 2 tablets (40 mEq total) by mouth 2 (two) times daily.   124 tablet   12    BP 169/96  Pulse 104  Temp(Src) 98 F (36.7 C) (Oral)  Resp 20  SpO2 99% Physical Exam  Nursing note and vitals reviewed. Constitutional: She appears well-developed and well-nourished. No distress.  HENT:  Head: Normocephalic and atraumatic.  Mouth/Throat: Oropharynx is clear and moist. No oropharyngeal exudate.  Eyes: Conjunctivae and EOM are normal. Pupils are  equal, round, and reactive to light. Right eye exhibits no discharge. Left eye exhibits no discharge. No scleral icterus.  Neck: Normal range of motion. Neck supple. No JVD present. No thyromegaly present.  Cardiovascular: Normal rate, regular rhythm, normal heart sounds and intact distal pulses.  Exam reveals no gallop and no friction rub.   No murmur heard. Pulmonary/Chest: Effort normal and breath sounds normal. No respiratory distress. She has no wheezes. She has no rales.  Abdominal: Soft. Bowel sounds are normal. She exhibits no distension and no mass. There is tenderness ( Palpation of the right upper quadrant, right mid abdomen and slightly less so in the right lower quadrant.).  Obese, no left-sided tenderness  Musculoskeletal: Normal range of motion. She exhibits no edema and no tenderness.  Lymphadenopathy:    She has no cervical adenopathy.  Neurological: She is alert. Coordination normal.  Skin: Skin is warm and dry. No rash noted. No erythema.  Psychiatric: She has a normal mood and affect. Her behavior is normal.    ED Course  Procedures (including critical care time) Labs Review Labs Reviewed  COMPREHENSIVE METABOLIC PANEL  CBC WITH DIFFERENTIAL  LIPASE, BLOOD  URINALYSIS, ROUTINE W REFLEX MICROSCOPIC  I-STAT CHEM 8, ED   Imaging Review No results found.  EKG Interpretation   None       MDM   Final diagnoses:  None    The patient has acute abdominal pain illness with right-sided pain.  We'll need to evaluate for hepatitis, transaminitis, bowel obstruction though less likely, appendicitis. She does not appear jaundiced, she is not febrile. Possibly abdominal wall injury or strain as she does have increased pain with movement.  Change of shift, care signed out to Dr. Zenia Resides pending CT scan  Johnna Acosta, MD 07/19/13 [phone]

## 2013-07-19 NOTE — ED Notes (Signed)
Patient transported to CT 

## 2013-07-19 NOTE — ED Notes (Signed)
Pt reports right side abdominal pain since Sunday. Pt reports persistent nausea, frequent bowel movement, denies urinary symptoms. Pt does not have gall bladder. Pt is A&Ox4, respirations equal and unlabored, skin warm and dry

## 2013-07-19 NOTE — ED Notes (Signed)
Patient is returned from [place]. Complaining of pain 9/10. MD notified.

## 2013-07-19 NOTE — Telephone Encounter (Signed)
The patient states she was seen in the emergency room at [facility] and they advised her to call her primary care and ask for a referral to a gastro dr  Call back [phone]

## 2013-07-19 NOTE — ED Notes (Signed)
MD at bedside. (Dr. Miller) 

## 2013-07-19 NOTE — Telephone Encounter (Signed)
Notified pt referral has been place,,,/lmb 

## 2013-07-19 NOTE — ED Notes (Signed)
Patient finished Barium. CT called and notified. Verbalized understanding.

## 2013-07-19 NOTE — ED Notes (Signed)
Patient getting dressed.

## 2013-07-19 NOTE — ED Provider Notes (Signed)
Patient signed out to me by Dr. Sabra Heck and lab results and x-rays reviewed with her. Patient to be treated for her gastritis and was encouraged to followup with her gastroenterologist.  Megan Jacobsen, MD 07/19/13 [phone]

## 2013-07-20 ENCOUNTER — Encounter: Payer: Self-pay | Admitting: Internal Medicine

## 2013-07-25 ENCOUNTER — Encounter: Payer: 59 | Admitting: Nurse Practitioner

## 2013-07-27 ENCOUNTER — Encounter: Payer: Self-pay | Admitting: Gastroenterology

## 2013-07-27 ENCOUNTER — Ambulatory Visit (INDEPENDENT_AMBULATORY_CARE_PROVIDER_SITE_OTHER): Payer: 59 | Admitting: Gastroenterology

## 2013-07-27 VITALS — BP 150/110 | HR 100 | Ht 64.5 in | Wt 252.2 lb

## 2013-07-27 DIAGNOSIS — R1011 Right upper quadrant pain: Secondary | ICD-10-CM

## 2013-07-27 DIAGNOSIS — R110 Nausea: Secondary | ICD-10-CM

## 2013-07-27 MED ORDER — PANTOPRAZOLE SODIUM 20 MG PO TBEC: 20.0000 mg | DELAYED_RELEASE_TABLET | Freq: Two times a day (BID) | ORAL | Status: DC

## 2013-07-27 NOTE — Patient Instructions (Signed)
Please increase Protonix to one capsule twice daily.  You have been scheduled for an endoscopy with propofol. Please follow written instructions given to you at your visit today. If you use inhalers (even only as needed), please bring them with you on the day of your procedure. Your physician has requested that you go to www.[email or web address].[email or web address] and enter the access code given to you at your visit today. This web site gives a general overview about your procedure. However, you should still follow specific instructions given to you by our office regarding your preparation for the procedure.

## 2013-07-27 NOTE — Progress Notes (Addendum)
07/27/2013 [name] [id number] [birth year]   HISTORY OF PRESENT ILLNESS:  Patient is a pleasant 38 year old female who is new to our practice.  Referred by PCP, [name].  She presents here today for followup from the emergency department after being seen there for complaints of right upper quadrant abdominal pain and nausea. She states that on February 21 she had sudden onset of the right upper quadrant abdominal pain and nausea. It would not go away and became very severe. She went to the emergency department on February 25 where she had a CT scan performed of the abdomen and pelvis without contrast. This showed mild wall thickening in the stomach antrum suggesting possible gastric antritis. She also had a 4.7 cm lesion in the right lower quadrant likely representing an ovarian cyst. CBC, CMP, and lipase were all unremarkable. She was given pain medication and IV fluids and was told to follow up with gastroenterology. She was given protonix, only 20 mg, to begin taking daily as well as Carafate 1 g take 4 times a day. She's been taking all of these medications with little improvement in her symptoms. She states that the pain is slightly better than when she visited the emergency department, but it is still present and it is constant but worsens at times. The worsening of the pain is not necessarily associated with eating. States that her appetite has been poor, but there's been no weight loss. She states that the pain and the nausea do keep her from falling asleep at times, but do not wake her from sleep during the night. She does have a history with Dr. Paulita [name] in 2013 where she was treated for irritable bowel syndrome.  She says that she has never undergone any endoscopic evaluation, however.  She had dicyclomine at home that was prescribed by him, which she has taken and thinks that it may have helped a little but is unsure. Also complaints of a lot of belching.  She does take NSAIDs home, just as  needed. She is status post cholecystectomy in 2008.  In regards to the ovary cyst, she was scheduled to see GYN earlier this week, but is going to reschedule because she was not feeling well.      Past Medical History  Diagnosis Date  . Hypertension   . Ovarian cyst     1999; surgically removed  . OSA (obstructive sleep apnea)     on CPAP   . IBS (irritable bowel syndrome)   . 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 Depression   . Anxiety   . Fibroid     age 41  . Termination of pregnancy     due to cardiac risk  . DVT (deep venous thrombosis)     2014  . CHF (congestive heart failure)   . Gallstones    Past Surgical History  Procedure Laterality Date  . Abdominal surgery      several  . Cesarean section    . Cholecystectomy    . Foot surgery Right   . Tubal ligation  1999    reports that she has never smoked. She has never used smokeless tobacco. She reports that she does not drink alcohol or use illicit drugs. family history includes Allergies in her daughter; Emphysema in her maternal grandmother; Heart disease (age of onset: 40) in her maternal grandmother; Rheum arthritis in her mother. Allergies  Allergen Reactions  . Ivp Dye [Iodinated Diagnostic Agents] Other (See  Comments)    Did something to my liver and kidneys told not to use it  . Vancomycin Other (See Comments)    "did something to my kidneys"  . Aspirin Other (See Comments)    wheezing  . Ciprofloxacin Itching and Rash  . Sulfa Antibiotics Itching and Rash      Outpatient Encounter Prescriptions as of 07/27/2013  Medication Sig  . amLODipine (NORVASC) 5 MG tablet Take 5 mg by mouth daily.   . Armodafinil (NUVIGIL) 250 MG tablet Take 250 mg by mouth daily.  . Aspirin-Acetaminophen-Caffeine (EXCEDRIN PO) Take 2 tablets by mouth every 6 (six) hours as needed (headache).  Marland Kitchen buPROPion (WELLBUTRIN XL) 150 MG 24 hr tablet Take 450 mg by mouth daily.  . carvedilol (COREG) 25 MG  tablet Take 1 tablet (25 mg total) by mouth 2 (two) times daily with a meal.  . colesevelam (WELCHOL) 625 MG tablet Take 625 mg by mouth daily as needed (ibs).  . cyclobenzaprine (FLEXERIL) 5 MG tablet Take 5 mg by mouth 3 (three) times daily as needed for muscle spasms.  . diazepam (VALIUM) 10 MG tablet Take 10 mg by mouth at bedtime as needed for sleep.   Marland Kitchen dicyclomine (BENTYL) 10 MG capsule Take 10 mg by mouth 3 (three) times daily as needed for spasms.  Marland Kitchen FLUoxetine (PROZAC) 20 MG capsule Take 20 mg by mouth every morning.   . fluticasone (FLONASE) 50 MCG/ACT nasal spray Place 1 spray into both nostrils at bedtime. Before donning CPAP  . furosemide (LASIX) 40 MG tablet Take 1 tablet (40 mg total) by mouth 2 (two) times daily.  Marland Kitchen gabapentin (NEURONTIN) 100 MG capsule Take 1 capsule (100 mg total) by mouth 2 (two) times daily.  Marland Kitchen gabapentin (NEURONTIN) 300 MG capsule Take 300 mg by mouth at bedtime. Daily at bedtime.  . ondansetron (ZOFRAN-ODT) 4 MG disintegrating tablet Take 1 tablet (4 mg total) by mouth every 8 (eight) hours as needed.  . pantoprazole (PROTONIX) 20 MG tablet Take 1 tablet (20 mg total) by mouth 2 (two) times daily.  . polyethylene glycol powder (GLYCOLAX/MIRALAX) powder Take 17 g by mouth 2 (two) times daily as needed.  . potassium chloride SA (K-DUR,KLOR-CON) 20 MEQ tablet Take 2 tablets (40 mEq total) by mouth 2 (two) times daily.  . sucralfate (CARAFATE) 1 G tablet Take 1 tablet (1 g total) by mouth 4 (four) times daily.  . [DISCONTINUED] pantoprazole (PROTONIX) 20 MG tablet Take 1 tablet (20 mg total) by mouth daily.  . phentermine 37.5 MG capsule Take 1 capsule (37.5 mg total) by mouth every morning.  . [DISCONTINUED] ibuprofen (ADVIL,MOTRIN) 800 MG tablet Take 1 tablet (800 mg total) by mouth 3 (three) times daily.  . [DISCONTINUED] Ibuprofen-Famotidine 800-26.6 MG TABS Take 3 tablets by mouth once.  . [DISCONTINUED] meclizine (ANTIVERT) 25 MG tablet Take 1 tablet (25  mg total) by mouth 3 (three) times daily as needed for dizziness or nausea.     REVIEW OF SYSTEMS  : All other systems reviewed and negative except where noted in the History of Present Illness.   PHYSICAL EXAM: BP 150/110  Pulse 100  Ht 5' 4.5" (1.638 m)  Wt 252 lb 4 oz (114.42 kg)  BMI 42.65 kg/m2  LMP 07/14/2013 General: Well developed black female in no acute distress Head: Normocephalic and atraumatic Eyes:  Sclerae anicteric, conjunctiva pink. Ears: Normal auditory acuity.  Lungs: Clear throughout to auscultation Heart: Regular rate and rhythm Abdomen: Soft, non-distended.  Normal  bowel sounds.  RUQ TTP without R/R/G. She also has tenderness over her right ribcage. Musculoskeletal: Symmetrical with no gross deformities  Skin: No lesions on visible extremities Extremities: No edema  Neurological: Alert oriented x 4, grossly non-focal. Psychological:  Alert and cooperative. Normal mood and affect  ASSESSMENT AND PLAN: -RUQ abdominal pain and nausea:  She does not have a gallbladder. CT scan of the abdomen showing mild wall thickening of the stomach suggesting gastritis.  No improvement on daily protonix 20 mg and carafate four times per day.  I am unsure of the cause of her pain; she was tender in the RUQ, but was also tender over the right lower ribcage.  Will schedule EGD for further evaluation.  Also, since she is only taking 20 mg of protonix, we will have her begin taking two per day.  We are going to try to obtain records from Dr. Lavetta Nielsen office.     *Just of note, patient also had a 4.7 cm lesion, likely an ovary cyst, in the RLQ on CT scan.  Advised her to follow-up with GYN for further evaluation.  Addendum: Reviewed and agree with initial management. Jerene Bears, MD

## 2013-07-31 ENCOUNTER — Encounter: Payer: Self-pay | Admitting: Cardiology

## 2013-08-02 ENCOUNTER — Ambulatory Visit (INDEPENDENT_AMBULATORY_CARE_PROVIDER_SITE_OTHER): Payer: 59 | Admitting: Family Medicine

## 2013-08-02 ENCOUNTER — Encounter: Payer: Self-pay | Admitting: Internal Medicine

## 2013-08-02 ENCOUNTER — Encounter: Payer: Self-pay | Admitting: Family Medicine

## 2013-08-02 ENCOUNTER — Ambulatory Visit (AMBULATORY_SURGERY_CENTER): Payer: 59 | Admitting: Internal Medicine

## 2013-08-02 VITALS — BP 132/81 | HR 92 | Temp 98.7°F | Resp 29 | Ht 64.5 in | Wt 252.0 lb

## 2013-08-02 VITALS — BP 136/82 | HR 94 | Temp 98.3°F | Resp 16 | Wt 250.0 lb

## 2013-08-02 DIAGNOSIS — K2970 Gastritis, unspecified, without bleeding: Secondary | ICD-10-CM

## 2013-08-02 DIAGNOSIS — M9981 Other biomechanical lesions of cervical region: Secondary | ICD-10-CM

## 2013-08-02 DIAGNOSIS — R1011 Right upper quadrant pain: Secondary | ICD-10-CM

## 2013-08-02 DIAGNOSIS — M999 Biomechanical lesion, unspecified: Secondary | ICD-10-CM

## 2013-08-02 DIAGNOSIS — K2990 Gastroduodenitis, unspecified, without bleeding: Secondary | ICD-10-CM

## 2013-08-02 DIAGNOSIS — M546 Pain in thoracic spine: Secondary | ICD-10-CM

## 2013-08-02 DIAGNOSIS — R110 Nausea: Secondary | ICD-10-CM

## 2013-08-02 DIAGNOSIS — K319 Disease of stomach and duodenum, unspecified: Secondary | ICD-10-CM

## 2013-08-02 MED ORDER — CYCLOBENZAPRINE HCL 10 MG PO TABS: 10.0000 mg | ORAL_TABLET | Freq: Three times a day (TID) | ORAL | Status: DC | PRN

## 2013-08-02 MED ORDER — TRAMADOL HCL 50 MG PO TABS: 50.0000 mg | ORAL_TABLET | Freq: Every evening | ORAL | Status: DC | PRN

## 2013-08-02 MED ORDER — SODIUM CHLORIDE 0.9 % IV SOLN: 500.0000 mL | INTRAVENOUS | Status: DC

## 2013-08-02 NOTE — Patient Instructions (Signed)
Discharge instructions given with verbal understanding. Biopsies taken. Resume previous medications. YOU HAD AN ENDOSCOPIC PROCEDURE TODAY AT THE [facility]: Refer to the procedure report that was given to you for any specific questions about what was found during the examination.  If the procedure report does not answer your questions, please call your gastroenterologist to clarify.  If you requested that your care partner not be given the details of your procedure findings, then the procedure report has been included in a sealed envelope for you to review at your convenience later.  YOU SHOULD EXPECT: Some feelings of bloating in the abdomen. Passage of more gas than usual.  Walking can help get rid of the air that was put into your GI tract during the procedure and reduce the bloating. If you had a lower endoscopy (such as a colonoscopy or flexible sigmoidoscopy) you may notice spotting of blood in your stool or on the toilet paper. If you underwent a bowel prep for your procedure, then you may not have a normal bowel movement for a few days.  DIET: Your first meal following the procedure should be a light meal and then it is ok to progress to your normal diet.  A half-sandwich or bowl of soup is an example of a good first meal.  Heavy or fried foods are harder to digest and may make you feel nauseous or bloated.  Likewise meals heavy in dairy and vegetables can cause extra gas to form and this can also increase the bloating.  Drink plenty of fluids but you should avoid alcoholic beverages for 24 hours.  ACTIVITY: Your care partner should take you home directly after the procedure.  You should plan to take it easy, moving slowly for the rest of the day.  You can resume normal activity the day after the procedure however you should NOT DRIVE or use heavy machinery for 24 hours (because of the sedation medicines used during the test).    SYMPTOMS TO REPORT IMMEDIATELY: A gastroenterologist  can be reached at any hour.  During normal business hours, 8:30 AM to 5:00 PM Monday through Friday, call [phone].  After hours and on weekends, please call the GI answering service at [phone] who will take a message and have the physician on call contact you.   Following upper endoscopy (EGD)  Vomiting of blood or coffee ground material  New chest pain or pain under the shoulder blades  Painful or persistently difficult swallowing  New shortness of breath  Fever of 100F or higher  Black, tarry-looking stools  FOLLOW UP: If any biopsies were taken you will be contacted by phone or by letter within the next 1-3 weeks.  Call your gastroenterologist if you have not heard about the biopsies in 3 weeks.  Our staff will call the home number listed on your records the next business day following your procedure to check on you and address any questions or concerns that you may have at that time regarding the information given to you following your procedure. This is a courtesy call and so if there is no answer at the home number and we have not heard from you through the emergency physician on call, we will assume that you have returned to your regular daily activities without incident.  SIGNATURES/CONFIDENTIALITY: You and/or your care partner have signed paperwork which will be entered into your electronic medical record.  These signatures attest to the fact that that the information above on your After   Visit Summary has been reviewed and is understood.  Full responsibility of the confidentiality of this discharge information lies with you and/or your care-partner. 

## 2013-08-02 NOTE — Op Note (Signed)
[facility]  [address] [place], [id number]   ENDOSCOPY PROCEDURE REPORT  PATIENT: Megan, [name]  MR#: [id number] BIRTHDATE: May 23, 1976 , 37  yrs. old GENDER: Female ENDOSCOPIST: Jerene Bears, MD PROCEDURE DATE:  08/02/2013 PROCEDURE:  EGD w/ biopsy ASA CLASS:     Class II INDICATIONS:  abdominal pain in the upper right quadrant.   Nausea.  MEDICATIONS: MAC sedation, administered by CRNA and propofol (Diprivan) 400mg  IV TOPICAL ANESTHETIC: Cetacaine Spray  DESCRIPTION OF PROCEDURE: After the risks benefits and alternatives of the procedure were thoroughly explained, informed consent was obtained.  The LB [id number] endoscope was introduced through the mouth and advanced to the second portion of the duodenum. Without limitations.  The instrument was slowly withdrawn as the mucosa was fully examined.    ESOPHAGUS: The mucosa of the esophagus appeared normal.   Z-line regular at 40 cm from incisors  STOMACH: Moderate erosive gastritis (inflammation) with small very superficial ulceration was found in the gastric antrum and prepyloric region of the stomach.  Biopsies were taken in the body, antrum and angularis.  Normal proximal stomach  DUODENUM: The duodenal mucosa showed no abnormalities in the bulb and second portion of the duodenum.  Retroflexed views revealed no abnormalities.     The scope was then withdrawn from the patient and the procedure completed.  COMPLICATIONS: There were no complications.  ENDOSCOPIC IMPRESSION: 1.   The mucosa of the esophagus appeared normal 2.   Erosive gastritis (inflammation) was found in the gastric antrum and prepyloric region of the stomach; biopsies were taken in the antrum and angularis 3.   The duodenal mucosa showed no abnormalities in the bulb and second portion of the duodenum  RECOMMENDATIONS: 1.  Await biopsy results 2.  Follow-up of helicobacter pylori status, treat if indicated 3.   Continue twice daily pantoprazole 20 mg for an additional 1 month 4.  No NSAIDs  eSigned:  Jerene Bears, MD 08/02/2013 10:06 AM CC:The Patient, Zehr, Maylon Peppers, and Rowe Clack, MD

## 2013-08-02 NOTE — Progress Notes (Signed)
  CC:  Upper back pain and manipulation.   HPI: Patient is here for followup of her upper back and neck pain. Patient was doing very well and has continued home exercises but was playing in the snow and noticed that she felt like she pulled something in her neck. Since that time her neck and upper back has been in considerable amount of pain. Patient feels about the same type of spasm that she had previously. Patient has not tried taking any medication at this time. Denies any radiation down the arms or any weakness. Patient states that it's been difficult to sleep at night secondary to the amount of tightness.   Past medical, surgical, family and social history reviewed. Medications reviewed all in the electronic medical record.   Review of Systems: No headache, visual changes, nausea, vomiting, diarrhea, constipation, dizziness, abdominal pain, skin rash, fevers, chills, night sweats, weight loss, swollen lymph nodes, body aches, joint swelling, muscle aches, chest pain, shortness of breath, mood changes.   Objective:    Blood pressure 136/82, pulse 94, temperature 98.3 F (36.8 C), temperature source Oral, resp. rate 16, weight 250 lb (113.399 kg), last menstrual period 07/14/2013, SpO2 97.00%.   General: No apparent distress alert and oriented x3 mood and affect normal, dressed appropriately.  HEENT: Pupils equal, extraocular movements intact Respiratory: [name] speak in full sentences and does not appear short of breath Cardiovascular: No lower extremity edema, non tender, no erythema Skin: Warm dry intact with no signs of infection or rash on extremities or on axial skeleton. Abdomen: Soft nontender Neuro: Cranial nerves II through XII are intact, neurovascularly intact in all extremities with 2+ DTRs and 2+ pulses. Lymph: No lymphadenopathy of posterior or anterior cervical chain or axillae bilaterally.  Gait normal with good balance and coordination.  MSK: Non tender with full range  of motion and good stability and symmetric strength and tone of  Shoulders, elbows, wrist, hip, knee and ankles bilaterally.  Neck: Inspection unremarkable. No palpable stepoffs. Negative Spurling's maneuver. Full neck range of motion Grip strength and sensation normal in bilateral hands Strength good C4 to T1 distribution No sensory change to C4 to T1 Negative Hoffman sign bilaterally Reflexes normal Patient does have spasm in the trapezius muscle on the right side mostly over the T5 area  Osteopathic findings Cervical C2 flexed rotated and side bent left C4 flexed rotated and side bent right  Thoracic T5 extended rotated inside that right significant muscle spasm in this area.   Lumbar L2 flexed rotated inside that right  Sacrum Left on left  Impression and Recommendations:     This case required medical decision making of moderate complexity.

## 2013-08-02 NOTE — Assessment & Plan Note (Signed)
Decision today to treat with OMT was based on Physical Exam  After verbal consent patient was treated with HVLA, ME, FPR techniques in cervical, thoracic and lumbar areas  Patient tolerated the procedure well with improvement in symptoms  Patient given exercises, stretches and lifestyle modifications  See medications in patient instructions if given  Patient will follow up in 2 weeks, hopefully a next visit we can to 4-6 weeks.

## 2013-08-02 NOTE — Assessment & Plan Note (Signed)
Still likely multifactorial with patient being overweight and muscle imbalances. Patient given some Flexeril to help with any type of breakthrough pain. We did increase her dose to 10 mg. Discussed icing protocol and given home exercise program for range of motion exercises. We talked about posture at work as well that could be beneficial. Patient will followup again in 3 weeks for further evaluation and possibly osteopathic manipulation.

## 2013-08-02 NOTE — Assessment & Plan Note (Signed)
Decision today to treat with OMT was based on Physical Exam  After verbal consent patient was treated with HVLA, ME, FPR techniques in cervical, thoracic and lumbar areas  Patient tolerated the procedure well with improvement in symptoms  Patient given exercises, stretches and lifestyle modifications  See medications in patient instructions if given  Patient will follow up in 2 weeks, hopefully a next visit we can to 4-6 weeks. 

## 2013-08-02 NOTE — Progress Notes (Signed)
Lidocaine-40mg IV prior to Propofol InductionPropofol given over incremental dosages 

## 2013-08-02 NOTE — Patient Instructions (Signed)
Good to see you Flexeril 5mg  three times a day as needed Tramadol 50 mg at night as needed 48 hours off.  Then exercises 3 times a week Come back in 2 weeks.

## 2013-08-02 NOTE — Progress Notes (Signed)
Pre visit review using our clinic review tool, if applicable. No additional management support is needed unless otherwise documented below in the visit note. 

## 2013-08-02 NOTE — Progress Notes (Signed)
Called to room to assist during endoscopic procedure.  Patient ID and intended procedure confirmed with present staff. Received instructions for my participation in the procedure from the performing physician.  

## 2013-08-03 ENCOUNTER — Telehealth: Payer: Self-pay

## 2013-08-03 NOTE — Telephone Encounter (Signed)
  Follow up Call-  Call back number 08/02/2013  Post procedure Call Back phone  # [phone]  Permission to leave phone message Yes     Patient questions:  Do you have a fever, pain , or abdominal swelling? no Pain Score  0 *  Have you tolerated food without any problems? yes  Have you been able to return to your normal activities? yes  Do you have any questions about your discharge instructions: Diet   no Medications  no Follow up visit  no  Do you have questions or concerns about your Care? no  Actions: * If pain score is 4 or above: No action needed, pain <4.

## 2013-08-07 ENCOUNTER — Ambulatory Visit: Payer: 59 | Admitting: Family Medicine

## 2013-08-08 ENCOUNTER — Other Ambulatory Visit: Payer: Self-pay

## 2013-08-08 DIAGNOSIS — R0600 Dyspnea, unspecified: Secondary | ICD-10-CM

## 2013-08-08 MED ORDER — CARVEDILOL 25 MG PO TABS: 25.0000 mg | ORAL_TABLET | Freq: Two times a day (BID) | ORAL | Status: DC

## 2013-08-09 ENCOUNTER — Encounter: Payer: Self-pay | Admitting: Internal Medicine

## 2013-08-09 ENCOUNTER — Telehealth: Payer: Self-pay | Admitting: *Deleted

## 2013-08-09 MED ORDER — GABAPENTIN 100 MG PO CAPS: 100.0000 mg | ORAL_CAPSULE | Freq: Two times a day (BID) | ORAL | Status: DC

## 2013-08-09 NOTE — Telephone Encounter (Signed)
Refill done.  

## 2013-08-21 ENCOUNTER — Ambulatory Visit: Payer: 59 | Admitting: Internal Medicine

## 2013-08-23 ENCOUNTER — Ambulatory Visit: Payer: 59 | Admitting: Gastroenterology

## 2013-09-05 ENCOUNTER — Ambulatory Visit (INDEPENDENT_AMBULATORY_CARE_PROVIDER_SITE_OTHER): Payer: 59 | Admitting: Family Medicine

## 2013-09-05 ENCOUNTER — Other Ambulatory Visit: Payer: Self-pay | Admitting: *Deleted

## 2013-09-05 ENCOUNTER — Ambulatory Visit: Payer: 59 | Admitting: Nurse Practitioner

## 2013-09-05 ENCOUNTER — Encounter: Payer: Self-pay | Admitting: Family Medicine

## 2013-09-05 VITALS — BP 128/84 | HR 113 | Wt 251.0 lb

## 2013-09-05 DIAGNOSIS — M999 Biomechanical lesion, unspecified: Secondary | ICD-10-CM

## 2013-09-05 DIAGNOSIS — M9981 Other biomechanical lesions of cervical region: Secondary | ICD-10-CM

## 2013-09-05 DIAGNOSIS — M546 Pain in thoracic spine: Secondary | ICD-10-CM

## 2013-09-05 NOTE — Patient Instructions (Signed)
Good to see you Y-T-A hold each position for 2 seconds and repeat 10 reps daily.  Great way to get out of bed.  When you improve lift legs off the ground as well.  Tennis ball to back right pocket with sitting long amount of time.  Lets try 4-6 weeks

## 2013-09-05 NOTE — Progress Notes (Signed)
  CC:  Upper back pain and manipulation.   HPI: Patient is here for followup of her upper back and neck pain.  Patient has been doing well. Patient continued to do the exercises regularly. Patient states that she has had some stiffness last couple days but after that she has been doing much better. Patient has been a little stressed with her recent trip coming up. No new symptoms no.   Past medical, surgical, family and social history reviewed. Medications reviewed all in the electronic medical record.   Review of Systems: No headache, visual changes, nausea, vomiting, diarrhea, constipation, dizziness, abdominal pain, skin rash, fevers, chills, night sweats, weight loss, swollen lymph nodes, body aches, joint swelling, muscle aches, chest pain, shortness of breath, mood changes.   Objective:    Blood pressure 128/84, pulse 113, weight 251 lb (113.853 kg), SpO2 96.00%.   General: No apparent distress alert and oriented x3 mood and affect normal, dressed appropriately.  HEENT: Pupils equal, extraocular movements intact Respiratory: [name] speak in full sentences and does not appear short of breath Cardiovascular: No lower extremity edema, non tender, no erythema Skin: Warm dry intact with no signs of infection or rash on extremities or on axial skeleton. Abdomen: Soft nontender Neuro: Cranial nerves II through XII are intact, neurovascularly intact in all extremities with 2+ DTRs and 2+ pulses. Lymph: No lymphadenopathy of posterior or anterior cervical chain or axillae bilaterally.  Gait normal with good balance and coordination.  MSK: Non tender with full range of motion and good stability and symmetric strength and tone of  Shoulders, elbows, wrist, hip, knee and ankles bilaterally.  Neck: Inspection unremarkable. No palpable stepoffs. Negative Spurling's maneuver. Full neck range of motion Grip strength and sensation normal in bilateral hands Strength good C4 to T1 distribution No  sensory change to C4 to T1 Negative Hoffman sign bilaterally Reflexes normal Muscle tightness is significantly less than previous the  Osteopathic findings Cervical C2 flexed rotated and side bent left C4 flexed rotated and side bent right  Thoracic T5 extended rotated and side bent right T8 extended rotated and side bent left   Lumbar L2 flexed rotated inside that right  Sacrum Left on left  Impression and Recommendations:     This case required medical decision making of moderate complexity.

## 2013-09-05 NOTE — Assessment & Plan Note (Signed)
Decision today to treat with OMT was based on Physical Exam  After verbal consent patient was treated with HVLA, ME, FPR techniques in cervical, thoracic and lumbar areas  Patient tolerated the procedure well with improvement in symptoms  Patient given exercises, stretches and lifestyle modifications  See medications in patient instructions if given  Patient will follow up in 4-6 weeks      

## 2013-09-05 NOTE — Telephone Encounter (Signed)
Received fax pt wanting refill on her birth control pill. Is this ok or should she see her gyn...[name]

## 2013-09-05 NOTE — Assessment & Plan Note (Signed)
Patient continues to do fairly well. Discuss again in about changes in her regular life and activities of daily living that could be beneficial. Patient given new exercises for postural training that could be helpful as well. We discussed with sitting long amount of time different adjustments that could be made accordingly. Patient will continue home exercises and come back again in 4-6 weeks. Continues to respond well to osteopathic regulation.

## 2013-09-06 MED ORDER — NORETHIN-ETH ESTRAD TRIPHASIC 0.5/0.75/1-35 MG-MCG PO TABS: 1.0000 | ORAL_TABLET | Freq: Every day | ORAL | Status: DC

## 2013-09-06 NOTE — Telephone Encounter (Signed)
md approved and sent to pt pharmacy...[name]

## 2013-09-29 ENCOUNTER — Ambulatory Visit (INDEPENDENT_AMBULATORY_CARE_PROVIDER_SITE_OTHER): Payer: 59 | Admitting: Internal Medicine

## 2013-09-29 ENCOUNTER — Encounter: Payer: Self-pay | Admitting: Internal Medicine

## 2013-09-29 ENCOUNTER — Ambulatory Visit: Payer: 59 | Admitting: Nurse Practitioner

## 2013-09-29 VITALS — BP 140/100 | HR 101 | Ht 65.0 in | Wt 249.0 lb

## 2013-09-29 DIAGNOSIS — R0989 Other specified symptoms and signs involving the circulatory and respiratory systems: Secondary | ICD-10-CM

## 2013-09-29 DIAGNOSIS — R0600 Dyspnea, unspecified: Secondary | ICD-10-CM

## 2013-09-29 DIAGNOSIS — R0609 Other forms of dyspnea: Secondary | ICD-10-CM

## 2013-09-29 DIAGNOSIS — I10 Essential (primary) hypertension: Secondary | ICD-10-CM

## 2013-09-29 LAB — BASIC METABOLIC PANEL
BUN: 7 mg/dL (ref 6–23)
CO2: 27 mEq/L (ref 19–32)
Calcium: 9.2 mg/dL (ref 8.4–10.5)
Chloride: 103 mEq/L (ref 96–112)
Creatinine, Ser: 0.9 mg/dL (ref 0.4–1.2)
GFR: 90.57 mL/min (ref >60.00)
Glucose, Bld: 91 mg/dL (ref 70–99)
Potassium: 3.7 mEq/L (ref 3.5–5.1)
Sodium: 137 mEq/L (ref 135–145)

## 2013-09-29 LAB — CBC
HCT: 39.4 % (ref 36.0–46.0)
Hemoglobin: 13.1 g/dL (ref 12.0–15.0)
MCHC: 33.2 g/dL (ref 30.0–36.0)
MCV: 88.6 fl (ref 78.0–100.0)
Platelets: 262 10*3/uL (ref 150.0–400.0)
RBC: 4.45 Mil/uL (ref 3.87–5.11)
RDW: 14.4 % (ref 11.5–15.5)
WBC: 8.6 10*3/uL (ref 4.0–10.5)

## 2013-09-29 LAB — BRAIN NATRIURETIC PEPTIDE: Pro B Natriuretic peptide (BNP): 129 pg/mL — ABNORMAL HIGH (ref 0.0–100.0)

## 2013-09-29 NOTE — Progress Notes (Addendum)
HPI  patinet is a 38 yo with history of peripartum cardiomyopathy She is normally followed by B Crenshaw.  She had an appt with L Gerhardt  Late. SHe says that over the past few wks she has been more SOB  Noticed a dry cough. Has been taking meds.   Denies change in her diet.  Allergies  Allergen Reactions  . Ivp Dye [Iodinated Diagnostic Agents] Other (See Comments)    Did something to my liver and kidneys told not to use it  . Vancomycin Other (See Comments)    "did something to my kidneys"  . Aspirin Other (See Comments)    wheezing  . Ciprofloxacin Itching and Rash  . Sulfa Antibiotics Itching and Rash    Current Outpatient Prescriptions  Medication Sig Dispense Refill  . amLODipine (NORVASC) 5 MG tablet Take 5 mg by mouth daily.       . Armodafinil (NUVIGIL) 250 MG tablet Take 250 mg by mouth daily.      . Aspirin-Acetaminophen-Caffeine (EXCEDRIN PO) Take 2 tablets by mouth every 6 (six) hours as needed (headache).      Marland Kitchen buPROPion (WELLBUTRIN XL) 150 MG 24 hr tablet Take 450 mg by mouth daily.      . carvedilol (COREG) 25 MG tablet Take 1 tablet (25 mg total) by mouth 2 (two) times daily with a meal.  60 tablet  3  . colesevelam (WELCHOL) 625 MG tablet Take 625 mg by mouth daily as needed (ibs).      . cyclobenzaprine (FLEXERIL) 10 MG tablet Take 1 tablet (10 mg total) by mouth 3 (three) times daily as needed for muscle spasms.  30 tablet  0  . diazepam (VALIUM) 10 MG tablet Take 10 mg by mouth at bedtime as needed for sleep.       Marland Kitchen dicyclomine (BENTYL) 10 MG capsule Take 10 mg by mouth 3 (three) times daily as needed for spasms.      Marland Kitchen FLUoxetine (PROZAC) 20 MG capsule Take 20 mg by mouth every morning.       . fluticasone (FLONASE) 50 MCG/ACT nasal spray Place 1 spray into both nostrils at bedtime. Before donning CPAP  16 g  2  . furosemide (LASIX) 40 MG tablet Take 1 tablet (40 mg total) by mouth 2 (two) times daily.  60 tablet  12  . gabapentin (NEURONTIN) 100 MG capsule  Take 1 capsule (100 mg total) by mouth 2 (two) times daily.  180 capsule  0  . gabapentin (NEURONTIN) 300 MG capsule Take 300 mg by mouth at bedtime. Daily at bedtime.      . norethindrone-ethinyl estradiol (NORTREL 7/7/7) 0.5/0.75/1-35 MG-MCG tablet Take 1 tablet by mouth daily. Take 1 by mouth daily  1 Package  6  . ondansetron (ZOFRAN-ODT) 4 MG disintegrating tablet Take 1 tablet (4 mg total) by mouth every 8 (eight) hours as needed.  20 tablet  0  . pantoprazole (PROTONIX) 20 MG tablet Take 1 tablet (20 mg total) by mouth 2 (two) times daily.  60 tablet  6  . polyethylene glycol powder (GLYCOLAX/MIRALAX) powder Take 17 g by mouth 2 (two) times daily as needed.  3350 g  1  . potassium chloride SA (K-DUR,KLOR-CON) 20 MEQ tablet Take 2 tablets (40 mEq total) by mouth 2 (two) times daily.  124 tablet  12  . sucralfate (CARAFATE) 1 G tablet Take 1 tablet (1 g total) by mouth 4 (four) times daily.  30 tablet  0  . traMADol (  ULTRAM) 50 MG tablet Take 1 tablet (50 mg total) by mouth at bedtime as needed.  30 tablet  0   No current facility-administered medications for this visit.    Past Medical History  Diagnosis Date  . Hypertension   . Ovarian cyst     1999; surgically removed  . OSA (obstructive sleep apnea)     on CPAP   . IBS (irritable bowel syndrome)   . 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 Depression   . Anxiety   . Fibroid     age 38  . Termination of pregnancy     due to cardiac risk  . DVT (deep venous thrombosis)     2014  . CHF (congestive heart failure)   . Gallstones     Past Surgical History  Procedure Laterality Date  . Abdominal surgery      several  . Cesarean section    . Cholecystectomy    . Foot surgery Right   . Tubal ligation  1999    Family History  Problem Relation Age of Onset  . Emphysema Maternal Grandmother     smoked  . Allergies Daughter   . Heart disease Maternal Grandmother 67    MI  . Rheum arthritis Mother      History   Social History  . Marital Status: Married    Spouse Name: N/A    Number of Children: 1  . Years of Education: N/A   Occupational History  . stay at home mom    Social History Main Topics  . Smoking status: Never Smoker   . Smokeless tobacco: Never Used  . Alcohol Use: No  . Drug Use: No  . Sexual Activity: Not on file   Other Topics Concern  . Not on file   Social History Narrative  . No narrative on file    Review of Systems:  All systems reviewed.  They are negative to the above problem except as previously stated.  Vital Signs: BP 140/100  Pulse 101  Ht 5\' 5"  (1.651 m)  Wt 249 lb (112.946 kg)  BMI 41.44 kg/m2  LMP 09/19/2013  Physical Exam Patient is a morbidly obese 38 yo  in NAD HEENT:  Normocephalic, atraumatic. EOMI, PERRLA.  Neck: JVP is normal.  No bruits.  Lungs: clear to auscultation. No rales no wheezes.  Heart: Regular rate and rhythm. Normal S1, S2. No S3.   No significant murmurs. PMI not displaced.  Abdomen:  Supple, nontender. Normal bowel sounds. No masses. No hepatomegaly.  Extremities:   Good distal pulses throughout. No lower extremity edema.  Musculoskeletal :moving all extremities.  Neuro:   alert and oriented x3.  CN II-XII grossly intact.  EKG  ST 101 bpm   Assessment and Plan:  1.  NICM  Patinet complaining of increased SOB/cough Would recomm labs today  Will get back with patient re lasix  On exam, volume does not appear to be signif increased but she is obese, making determiniation difficult/ F/U B Crenshaw  2.  HTN  HIgh today  Of note I see she called in for BP 100  Told to stop amlodipine She is now off of losartan  Will need to see if there is a reason there Will check K and Cr  If ok will resume 50 Losartan  Discussed diet, Na

## 2013-09-29 NOTE — Patient Instructions (Signed)
Your physician recommends that you return for lab work TODAY (BMET, BNP, CBC)  Your physician recommends that you schedule a follow-up appointment in: [place] DR. CRENSHAW.

## 2013-10-03 ENCOUNTER — Emergency Department (HOSPITAL_COMMUNITY): Payer: 59

## 2013-10-03 ENCOUNTER — Encounter (HOSPITAL_COMMUNITY): Payer: Self-pay | Admitting: Emergency Medicine

## 2013-10-03 ENCOUNTER — Emergency Department (HOSPITAL_COMMUNITY)
Admission: EM | Admit: 2013-10-03 | Discharge: 2013-10-04 | Disposition: A | Discharge status: Home / Self Care | Payer: 59 | Attending: Emergency Medicine | Admitting: Emergency Medicine

## 2013-10-03 DIAGNOSIS — Z8719 Personal history of other diseases of the digestive system: Secondary | ICD-10-CM | POA: Insufficient documentation

## 2013-10-03 DIAGNOSIS — O903 Peripartum cardiomyopathy: Secondary | ICD-10-CM

## 2013-10-03 DIAGNOSIS — R0600 Dyspnea, unspecified: Secondary | ICD-10-CM

## 2013-10-03 DIAGNOSIS — I10 Essential (primary) hypertension: Secondary | ICD-10-CM

## 2013-10-03 DIAGNOSIS — F411 Generalized anxiety disorder: Secondary | ICD-10-CM | POA: Insufficient documentation

## 2013-10-03 DIAGNOSIS — F329 Major depressive disorder, single episode, unspecified: Secondary | ICD-10-CM | POA: Insufficient documentation

## 2013-10-03 DIAGNOSIS — F3289 Other specified depressive episodes: Secondary | ICD-10-CM | POA: Insufficient documentation

## 2013-10-03 DIAGNOSIS — Z8742 Personal history of other diseases of the female genital tract: Secondary | ICD-10-CM | POA: Insufficient documentation

## 2013-10-03 DIAGNOSIS — I509 Heart failure, unspecified: Secondary | ICD-10-CM

## 2013-10-03 DIAGNOSIS — Z86718 Personal history of other venous thrombosis and embolism: Secondary | ICD-10-CM | POA: Insufficient documentation

## 2013-10-03 DIAGNOSIS — Z8669 Personal history of other diseases of the nervous system and sense organs: Secondary | ICD-10-CM | POA: Insufficient documentation

## 2013-10-03 DIAGNOSIS — IMO0002 Reserved for concepts with insufficient information to code with codable children: Secondary | ICD-10-CM | POA: Insufficient documentation

## 2013-10-03 LAB — BASIC METABOLIC PANEL
BUN: 5 mg/dL — ABNORMAL LOW (ref 6–23)
CO2: 25 mEq/L (ref 19–32)
Calcium: 9.4 mg/dL (ref 8.4–10.5)
Chloride: 102 mEq/L (ref 96–112)
Creatinine, Ser: 0.87 mg/dL (ref 0.50–1.10)
GFR calc Af Amer: >90 mL/min (ref >90)
GFR calc non Af Amer: 83 mL/min — ABNORMAL LOW (ref >90)
Glucose, Bld: 117 mg/dL — ABNORMAL HIGH (ref 70–99)
Potassium: 3.8 mEq/L (ref 3.7–5.3)
Sodium: 140 mEq/L (ref 137–147)

## 2013-10-03 LAB — CBC
HCT: 38.9 % (ref 36.0–46.0)
Hemoglobin: 12.8 g/dL (ref 12.0–15.0)
MCH: 28.7 pg (ref 26.0–34.0)
MCHC: 32.9 g/dL (ref 30.0–36.0)
MCV: 87.2 fL (ref 78.0–100.0)
Platelets: 266 10*3/uL (ref 150–400)
RBC: 4.46 MIL/uL (ref 3.87–5.11)
RDW: 13.1 % (ref 11.5–15.5)
WBC: 8 10*3/uL (ref 4.0–10.5)

## 2013-10-03 LAB — PRO B NATRIURETIC PEPTIDE: Pro B Natriuretic peptide (BNP): 1319 pg/mL — ABNORMAL HIGH (ref 0–125)

## 2013-10-03 LAB — I-STAT TROPONIN, ED: Troponin i, poc: 0 ng/mL (ref 0.00–0.08)

## 2013-10-03 MED ORDER — FUROSEMIDE 40 MG PO TABS: ORAL_TABLET | ORAL | Status: DC

## 2013-10-03 MED ORDER — FUROSEMIDE 10 MG/ML IJ SOLN: 80.0000 mg | Freq: Once | INTRAMUSCULAR | Status: DC

## 2013-10-03 MED ORDER — TRAMADOL HCL 50 MG PO TABS: 50.0000 mg | ORAL_TABLET | Freq: Every evening | ORAL | Status: DC | PRN

## 2013-10-03 MED ORDER — FUROSEMIDE 20 MG PO TABS
80.0000 mg | ORAL_TABLET | Freq: Once | ORAL | Status: AC
  Administered 2013-10-03: 80 mg via ORAL
  Filled 2013-10-03: qty 4

## 2013-10-03 NOTE — ED Notes (Signed)
Pt in c/o central chest pain and shortness of breath with exertion over the last few days, today symptoms seem to be getting worse, history of cardiac problems and was seen for a regular check up at her cardiologist office on Friday and was having chest pain at that time- they completed an EKG and evaluated her at that time. Pt denies other symptoms. Rates pain 8/10 at this time.

## 2013-10-03 NOTE — ED Provider Notes (Signed)
CSN: [id number]     Arrival date & time 10/03/13  1918 History   First MD Initiated Contact with Patient 10/03/13 2016     Chief Complaint  Patient presents with  . Chest Pain     (Consider location/radiation/quality/duration/timing/severity/associated sxs/prior Treatment) Patient is a 38 y.o. female presenting with chest pain.  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 She also reports 4 days of sharp, left sided pleuritic chest pain, worse with deep breath and palpation. She [facility] for regular followup and told them about SOB. Had labs checked which were fine except for mild increase in BNP. Per the notes available, she was to begin taking Lasix 40mg  every other day, however patient states she is already taking Lasix 40mg  BID.   I also inquired about documented history of DVT, but she apparently had a negative doppler study at that time and was never treated with anticoagulation. I don't believe this is accurate history in EPIC.   Past Medical History  Diagnosis Date  . Hypertension   . Ovarian cyst     1999; surgically removed  . OSA (obstructive sleep apnea)     on CPAP   . IBS (irritable bowel syndrome)   . 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 Depression   . Anxiety   . Fibroid     age 6  . Termination of pregnancy     due to cardiac risk  . DVT (deep venous thrombosis)     2014  . CHF (congestive heart failure)   . Gallstones    Past Surgical History  Procedure Laterality Date  . Abdominal surgery      several  . Cesarean section    . Cholecystectomy    . Foot surgery Right   . Tubal ligation  1999   Family History  Problem Relation Age of Onset  . Emphysema Maternal Grandmother     smoked  . Allergies Daughter   . Heart disease Maternal Grandmother 21    MI  . Rheum arthritis Mother    History   Substance Use Topics  . Smoking status: Never Smoker   . Smokeless tobacco: Never Used  . Alcohol Use: No   OB History   Grav Para Term Preterm Abortions TAB SAB Ect Mult Living   2 1   1 1    1      Review of Systems  Cardiovascular: Positive for chest pain.    All other systems reviewed and are negative except as noted in HPI.    Allergies  Ivp dye; Vancomycin; Aspirin; Ciprofloxacin; and Sulfa antibiotics  Home Medications   Prior to Admission medications   Medication Sig Start Date End Date Taking? Authorizing Provider  amLODipine (NORVASC) 5 MG tablet Take 5 mg by mouth daily.  06/17/13  Yes Historical Provider, MD  Armodafinil (NUVIGIL) 250 MG tablet Take 250 mg by mouth daily.   Yes Historical Provider, MD  Aspirin-Acetaminophen-Caffeine (EXCEDRIN PO) Take 2 tablets by mouth every 6 (six) hours as needed (headache).   Yes Historical Provider, MD  buPROPion (WELLBUTRIN XL) 150 MG 24 hr tablet Take 450 mg by mouth daily.   Yes Historical Provider, MD  carvedilol (COREG) 25 MG tablet Take 1 tablet (25 mg total) by mouth 2 (two) times daily with a meal. 08/08/13  Yes Lelon Perla, MD  colesevelam (WELCHOL) 625 MG tablet Take 625 mg by mouth daily as needed (ibs).   Yes Historical Provider, MD  cyclobenzaprine (FLEXERIL) 10 MG tablet Take 1 tablet (10 mg total) by mouth 3 (three) times daily as needed for muscle spasms. 08/02/13  Yes Lyndal Pulley, DO  diazepam (VALIUM) 10 MG tablet Take 10 mg by mouth at bedtime as needed for sleep.    Yes Historical Provider, MD  dicyclomine (BENTYL) 10 MG capsule Take 10 mg by mouth 3 (three) times daily as needed for spasms.   Yes Historical Provider, MD  FLUoxetine (PROZAC) 20 MG capsule Take 20 mg by mouth every morning.  01/31/13  Yes Historical Provider, MD  fluticasone (FLONASE) 50 MCG/ACT nasal spray Place 1 spray into both nostrils at bedtime. Before donning CPAP 05/23/13  Yes Rowe Clack, MD  furosemide (LASIX) 40 MG tablet  Take 1 tablet (40 mg total) by mouth 2 (two) times daily. 01/25/13  Yes Lelon Perla, MD  gabapentin (NEURONTIN) 100 MG capsule Take 1 capsule (100 mg total) by mouth 2 (two) times daily. 08/09/13  Yes Lyndal Pulley, DO  gabapentin (NEURONTIN) 300 MG capsule Take 300 mg by mouth at bedtime. Daily at bedtime. 05/29/13  Yes Lyndal Pulley, DO  pantoprazole (PROTONIX) 20 MG tablet Take 1 tablet (20 mg total) by mouth 2 (two) times daily. 07/27/13  Yes Jessica D. Zehr, PA-C  potassium chloride SA (K-DUR,KLOR-CON) 20 MEQ tablet Take 2 tablets (40 mEq total) by mouth 2 (two) times daily. 02/17/13  Yes Lelon Perla, MD  sucralfate (CARAFATE) 1 G tablet Take 1 g by mouth daily.   Yes Historical Provider, MD  traMADol (ULTRAM) 50 MG tablet Take 50 mg by mouth at bedtime as needed for moderate pain.   Yes Historical Provider, MD   BP 169/99  Pulse 80  Temp(Src) 98.5 F (36.9 C) (Oral)  Resp 20  Ht 5\' 5"  (1.651 m)  Wt 249 lb (112.946 kg)  BMI 41.44 kg/m2  SpO2 100%  LMP 09/30/2013 Physical Exam  Nursing note and vitals reviewed. Constitutional: She is oriented to person, place, and time. She appears well-developed and well-nourished.  HENT:  Head: Normocephalic and atraumatic.  Eyes: EOM are normal. Pupils are equal, round, and reactive to light.  Neck: Normal range of motion. Neck supple.  Cardiovascular: Normal rate, normal heart sounds and intact distal pulses.   Pulmonary/Chest: Effort normal and breath sounds normal. She has no wheezes. She has no rales. She exhibits tenderness.  Abdominal: Bowel sounds are normal. She exhibits no distension. There is no tenderness.  Musculoskeletal: Normal range of motion. She exhibits edema (trace ankles, symmetric). She exhibits no tenderness.  Neurological: She is alert and oriented to person, place, and time. She has normal strength. No cranial nerve deficit or sensory deficit.  Skin: Skin is warm and dry. No rash noted.  Psychiatric: She has a  normal mood and affect.    ED Course  Procedures (including critical care time) Labs Review Labs Reviewed  BASIC METABOLIC PANEL - Abnormal; Notable for the following:    Glucose, Bld 117 (*)    BUN 5 (*)    GFR calc non Af Amer 83 (*)    All other components within normal limits  PRO B NATRIURETIC PEPTIDE - Abnormal; Notable for the following:    Pro B Natriuretic peptide (BNP) 1319.0 (*)    All other components within normal limits  CBC  I-STAT TROPOININ, ED  Imaging Review Dg Chest 2 View  10/03/2013   CLINICAL DATA:  Chest pain and shortness of breath for 2 weeks, high blood pressure, cardiomyopathy  EXAM: CHEST  2 VIEW  COMPARISON:  None.  FINDINGS: The heart size and mediastinal contours are within normal limits. Both lungs are clear. The visualized skeletal structures are unremarkable.  IMPRESSION: No active cardiopulmonary disease.   Electronically Signed   By: Skipper Cliche M.D.   On: 10/03/2013 21:25     EKG Interpretation None      MDM   Final diagnoses:  CHF exacerbation    BNP with slight increase above previous value, but CXR is clear, no respiratory distress. Initially ordered Lasix to be given in the ED via IV, but patient does not have IV access already established and would prefer to take it orally and then go home. Advised to increase her home Lasix to 80mg  in am and 40mg  in pm for the next several days and follow up with [facility].    Charles B. Karle Starch, MD 10/03/13 2231

## 2013-10-04 MED ORDER — OXYCODONE-ACETAMINOPHEN 5-325 MG PO TABS
1.0000 | ORAL_TABLET | ORAL | Status: DC | PRN
Start: 2013-10-04 — End: 2013-10-04

## 2013-10-10 ENCOUNTER — Encounter: Payer: Self-pay | Admitting: Family Medicine

## 2013-10-10 ENCOUNTER — Ambulatory Visit (INDEPENDENT_AMBULATORY_CARE_PROVIDER_SITE_OTHER): Payer: 59 | Admitting: Family Medicine

## 2013-10-10 VITALS — BP 130/88 | HR 84 | Ht 65.0 in | Wt 252.0 lb

## 2013-10-10 DIAGNOSIS — M999 Biomechanical lesion, unspecified: Secondary | ICD-10-CM

## 2013-10-10 DIAGNOSIS — F418 Other specified anxiety disorders: Secondary | ICD-10-CM

## 2013-10-10 DIAGNOSIS — M546 Pain in thoracic spine: Secondary | ICD-10-CM

## 2013-10-10 DIAGNOSIS — M9981 Other biomechanical lesions of cervical region: Secondary | ICD-10-CM

## 2013-10-10 DIAGNOSIS — F341 Dysthymic disorder: Secondary | ICD-10-CM

## 2013-10-10 MED ORDER — HYDROXYZINE HCL 25 MG PO TABS
25.0000 mg | ORAL_TABLET | Freq: Three times a day (TID) | ORAL | Status: DC | PRN
Start: 2013-10-10 — End: 2014-02-19

## 2013-10-10 NOTE — Assessment & Plan Note (Signed)
Patient has been making some improvement. Patient did have a new elevated rib that likely was given her some discomfort. Patient did respond well to osteopathic angulation. Patient was given more instructions with sleeping technique and working position that could be beneficial. Patient will follow up again in 3 weeks for further evaluation and treatment.  Spent greater than 25 minutes with patient face-to-face and had greater than 50% of counseling including as described above in assessment and plan.

## 2013-10-10 NOTE — Assessment & Plan Note (Signed)
Patient did seem quite anxious today. He was given some hydroxyzine for breakthrough and anxiety at this time. We discussed meditation other things. Patient denies any suicidal or homicidal ideation. Patient is continuing on Wellbutrin. Patient will followup with primary care provider to discuss further.

## 2013-10-10 NOTE — Progress Notes (Signed)
  CC:  Upper back pain and manipulation.   HPI: Patient is here for followup of her upper back and neck pain.  Patient was doing her exercises and was responding very well. Patient unfortunately did have an exacerbation of her congestive heart failure. Patient then was sleeping in different positions and does notice some more left-sided neck pain. This is somewhat different than her regular pain. States that it hurts all the time and she has trouble rotating her head to the left.   Past medical, surgical, family and social history reviewed. Medications reviewed all in the electronic medical record.   Review of Systems: No headache, visual changes, nausea, vomiting, diarrhea, constipation, dizziness, abdominal pain, skin rash, fevers, chills, night sweats, weight loss, swollen lymph nodes, body aches, joint swelling, muscle aches, chest pain, shortness of breath, mood changes.   Objective:    Blood pressure 130/88, pulse 84, height 5\' 5"  (1.651 m), weight 252 lb (114.306 kg), last menstrual period 09/30/2013, SpO2 98.00%.   General: No apparent distress alert and oriented x3 mood and affect normal, dressed appropriately.  HEENT: Pupils equal, extraocular movements intact Respiratory: [name] speak in full sentences and does not appear short of breath Cardiovascular: No lower extremity edema, non tender, no erythema Skin: Warm dry intact with no signs of infection or rash on extremities or on axial skeleton. Abdomen: Soft nontender Neuro: Cranial nerves II through XII are intact, neurovascularly intact in all extremities with 2+ DTRs and 2+ pulses. Lymph: No lymphadenopathy of posterior or anterior cervical chain or axillae bilaterally.  Gait normal with good balance and coordination.  MSK: Non tender with full range of motion and good stability and symmetric strength and tone of  Shoulders, elbows, wrist, hip, knee and ankles bilaterally.  Neck: Inspection unremarkable. No palpable  stepoffs. Negative Spurling's maneuver. Full neck range of motion Grip strength and sensation normal in bilateral hands Strength good C4 to T1 distribution No sensory change to C4 to T1 Negative Hoffman sign bilaterally Reflexes normal   Osteopathic findings Cervical C2 flexed rotated and side bent left C4 flexed rotated and side bent right  Thoracic T2 extended rotated and side bent left with elevated second rib T5 extended rotated and side bent right   Lumbar L2 flexed rotated inside that right  Sacrum Left on left  Impression and Recommendations:     This case required medical decision making of moderate complexity.

## 2013-10-10 NOTE — Patient Instructions (Addendum)
Enjoy rhianna ringtone.  Hydroxyzine up to 3 times daily as needed. May make you a little sleepy.  Try at home first.  You are doing great! Continue exercises. Come back in 3 weeks.

## 2013-10-10 NOTE — Assessment & Plan Note (Signed)
Decision today to treat with OMT was based on Physical Exam  After verbal consent patient was treated with HVLA, ME, FPR techniques in cervical, thoracic and lumbar as well as rib areas  Patient tolerated the procedure well with improvement in symptoms  Patient given exercises, stretches and lifestyle modifications  See medications in patient instructions if given  Patient will follow up in  3 weeks.

## 2013-10-19 ENCOUNTER — Encounter (HOSPITAL_COMMUNITY)
Admission: RE | Admit: 2013-10-19 | Discharge: 2013-10-19 | Disposition: A | Discharge status: Home / Self Care | Payer: 59 | Source: Ambulatory Visit | Attending: Cardiology | Admitting: Cardiology

## 2013-10-19 NOTE — Progress Notes (Signed)
Cardiac Rehab Medication Review by a Pharmacist  Does the patient  feel that his/her medications are working for him/her?  yes  Has the patient been experiencing any side effects to the medications prescribed?  no  Does the patient measure his/her own blood pressure or blood glucose at home?  Yes, and checks HR regularly and charts BP on phone  Does the patient have any problems obtaining medications due to transportation or finances?   yes  Understanding of regimen: good Understanding of indications: good Potential of compliance: good    Pharmacist comments: Patient does not have any questions for me at this time.  Overall, patient is happy with her medications.    Jeronimo Norma, PharmD Clinical Pharmacist Resident Pager: [phone]   Jeronimo Norma 10/19/2013 8:46 AM

## 2013-10-20 ENCOUNTER — Encounter: Payer: Self-pay | Admitting: Cardiology

## 2013-10-20 ENCOUNTER — Encounter: Payer: Self-pay | Admitting: *Deleted

## 2013-10-20 ENCOUNTER — Ambulatory Visit (INDEPENDENT_AMBULATORY_CARE_PROVIDER_SITE_OTHER): Payer: 59 | Admitting: Cardiology

## 2013-10-20 VITALS — BP 124/80 | HR 104 | Ht 65.0 in | Wt 249.0 lb

## 2013-10-20 DIAGNOSIS — O903 Peripartum cardiomyopathy: Secondary | ICD-10-CM

## 2013-10-20 DIAGNOSIS — I10 Essential (primary) hypertension: Secondary | ICD-10-CM

## 2013-10-20 MED ORDER — LOSARTAN POTASSIUM 50 MG PO TABS: 50.0000 mg | ORAL_TABLET | Freq: Every day | ORAL | Status: DC

## 2013-10-20 MED ORDER — SPIRONOLACTONE 25 MG PO TABS: 25.0000 mg | ORAL_TABLET | Freq: Every day | ORAL | Status: DC

## 2013-10-20 NOTE — Assessment & Plan Note (Signed)
Blood pressure controlled. Medication changes as outlined under cardiomyopathy.

## 2013-10-20 NOTE — Progress Notes (Signed)
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 Echocardiogram in August of 2014 showed an ejection fraction of 35-40% and grade 2 diastolic dysfunction. VQ scan in August of 2014 was very low probability; lower ext doppers neg. Recently seen for increased dyspnea. Lasix increased. Since then, She feels somewhat better. She does not have significant dyspnea on exertion but does have occasional pedal edema. No chest pain or syncope.   Current Outpatient Prescriptions  Medication Sig Dispense Refill  . amLODipine (NORVASC) 5 MG tablet Take 5 mg by mouth daily.       . Armodafinil (NUVIGIL) 250 MG tablet Take 250 mg by mouth daily.      . Aspirin-Acetaminophen-Caffeine (EXCEDRIN PO) Take 2 tablets by mouth every 6 (six) hours as needed (headache).      Marland Kitchen buPROPion (WELLBUTRIN XL) 150 MG 24 hr tablet Take 450 mg by mouth daily.      . carvedilol (COREG) 25 MG tablet Take 1 tablet (25 mg total) by mouth 2 (two) times daily with a meal.  60 tablet  3  . colesevelam (WELCHOL) 625 MG tablet Take 625 mg by mouth daily as needed (ibs).      . cyclobenzaprine (FLEXERIL) 10 MG tablet Take 1 tablet (10 mg total) by mouth 3 (three) times daily as needed for muscle spasms.  30 tablet  0  . dicyclomine (BENTYL) 10 MG capsule Take 10 mg by mouth 3 (three) times daily as needed for spasms.      Marland Kitchen FLUoxetine (PROZAC) 20 MG capsule Take 20 mg by mouth every morning.       . fluticasone (FLONASE) 50 MCG/ACT nasal spray Place 1 spray into both nostrils at bedtime. Before donning CPAP  16 g  2  . furosemide (LASIX) 40 MG tablet Take 80mg  in the morning and 40mg  in the evening for 5 days  60 tablet  0  . gabapentin (NEURONTIN) 100 MG capsule Take 1 capsule (100 mg total) by mouth 2 (two) times daily.  180 capsule  0  . gabapentin (NEURONTIN) 300 MG capsule Take 300 mg by mouth at bedtime. Daily at bedtime.      . hydrOXYzine (ATARAX/VISTARIL) 25 MG tablet Take 1  tablet (25 mg total) by mouth 3 (three) times daily as needed for anxiety.  30 tablet  0  . pantoprazole (PROTONIX) 20 MG tablet Take 1 tablet (20 mg total) by mouth 2 (two) times daily.  60 tablet  6  . potassium chloride SA (K-DUR,KLOR-CON) 20 MEQ tablet Take 2 tablets (40 mEq total) by mouth 2 (two) times daily.  124 tablet  12  . Skin Protectants, Misc. (EUCERIN) cream Apply 1 application topically as needed for dry skin.      Marland Kitchen sucralfate (CARAFATE) 1 G tablet Take 1 g by mouth daily.      . traMADol (ULTRAM) 50 MG tablet Take 1 tablet (50 mg total) by mouth at bedtime as needed for moderate pain.  30 tablet  0   No current facility-administered medications for this visit.     Past Medical History  Diagnosis Date  . Hypertension   . Ovarian cyst     1999; surgically removed  . OSA (obstructive sleep apnea)     on CPAP   . IBS (irritable bowel syndrome)   . 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 Depression   . Anxiety   . Fibroid  age 38  . Termination of pregnancy     due to cardiac risk  . DVT (deep venous thrombosis)     2014  . CHF (congestive heart failure)   . Gallstones     Past Surgical History  Procedure Laterality Date  . Abdominal surgery      several  . Cesarean section    . Cholecystectomy    . Foot surgery Right   . Tubal ligation  1999    History   Social History  . Marital Status: Married    Spouse Name: N/A    Number of Children: 1  . Years of Education: N/A   Occupational History  . stay at home mom    Social History Main Topics  . Smoking status: Never Smoker   . Smokeless tobacco: Never Used  . Alcohol Use: No  . Drug Use: No  . Sexual Activity: Not on file   Other Topics Concern  . Not on file   Social History Narrative  . No narrative on file    ROS: no fevers or chills, productive cough, hemoptysis, dysphasia, odynophagia, melena, hematochezia, dysuria, hematuria, rash, seizure activity,  orthopnea, PND, pedal edema, claudication. Remaining systems are negative.  Physical Exam: Well-developed well-nourished in no acute distress.  Skin is warm and dry.  HEENT is normal.  Neck is supple.  Chest is clear to auscultation with normal expansion.  Cardiovascular exam is regular rate and rhythm.  Abdominal exam nontender or distended. No masses palpated. Extremities show no edema. neuro grossly intact  ECG 10/03/2013-sinus tachycardia, frequent PVCs, left axis deviation, nonspecific ST changes.

## 2013-10-20 NOTE — Assessment & Plan Note (Signed)
She describes some increased dyspnea. She is presently taking Lasix 20 mg 3 times daily. She will take an additional 40 mg daily as needed. Instructed to remain on low sodium diet. Add spironolactone 25 mg daily. Discontinue Norvasc. Add Cozaar 50 mg daily. Continue beta blocker. Check potassium and renal function in one week. Repeat echocardiogram.

## 2013-10-20 NOTE — Patient Instructions (Signed)
Your physician recommends that you schedule a follow-up appointment in: [name] has requested that you have an echocardiogram. Echocardiography is a painless test that uses sound waves to create images of your heart. It provides your doctor with information about the size and shape of your heart and how well your heart's chambers and valves are working. This procedure takes approximately one hour. There are no restrictions for this procedure.   START SPIRONOLACTONE 25 MG ONCE DAILY  STOP AMLODIPINE  START LOSARTAN 50 MG ONCE DAILY  Your physician recommends that you return for lab IN ONE WEEK

## 2013-10-21 ENCOUNTER — Encounter (HOSPITAL_BASED_OUTPATIENT_CLINIC_OR_DEPARTMENT_OTHER): Payer: Self-pay | Admitting: Emergency Medicine

## 2013-10-21 ENCOUNTER — Emergency Department (HOSPITAL_BASED_OUTPATIENT_CLINIC_OR_DEPARTMENT_OTHER): Payer: 59

## 2013-10-21 ENCOUNTER — Emergency Department (HOSPITAL_BASED_OUTPATIENT_CLINIC_OR_DEPARTMENT_OTHER)
Admission: EM | Admit: 2013-10-21 | Discharge: 2013-10-21 | Disposition: A | Discharge status: Home / Self Care | Payer: 59 | Attending: Emergency Medicine | Admitting: Emergency Medicine

## 2013-10-21 DIAGNOSIS — F329 Major depressive disorder, single episode, unspecified: Secondary | ICD-10-CM | POA: Insufficient documentation

## 2013-10-21 DIAGNOSIS — Z79899 Other long term (current) drug therapy: Secondary | ICD-10-CM | POA: Insufficient documentation

## 2013-10-21 DIAGNOSIS — Z86718 Personal history of other venous thrombosis and embolism: Secondary | ICD-10-CM | POA: Insufficient documentation

## 2013-10-21 DIAGNOSIS — Z8742 Personal history of other diseases of the female genital tract: Secondary | ICD-10-CM | POA: Insufficient documentation

## 2013-10-21 DIAGNOSIS — F3289 Other specified depressive episodes: Secondary | ICD-10-CM | POA: Insufficient documentation

## 2013-10-21 DIAGNOSIS — Y929 Unspecified place or not applicable: Secondary | ICD-10-CM | POA: Insufficient documentation

## 2013-10-21 DIAGNOSIS — F411 Generalized anxiety disorder: Secondary | ICD-10-CM | POA: Insufficient documentation

## 2013-10-21 DIAGNOSIS — S82892A Other fracture of left lower leg, initial encounter for closed fracture: Secondary | ICD-10-CM

## 2013-10-21 DIAGNOSIS — G4733 Obstructive sleep apnea (adult) (pediatric): Secondary | ICD-10-CM | POA: Insufficient documentation

## 2013-10-21 DIAGNOSIS — I509 Heart failure, unspecified: Secondary | ICD-10-CM | POA: Insufficient documentation

## 2013-10-21 DIAGNOSIS — Y9301 Activity, walking, marching and hiking: Secondary | ICD-10-CM | POA: Insufficient documentation

## 2013-10-21 DIAGNOSIS — X500XXA Overexertion from strenuous movement or load, initial encounter: Secondary | ICD-10-CM | POA: Insufficient documentation

## 2013-10-21 DIAGNOSIS — S8263XA Displaced fracture of lateral malleolus of unspecified fibula, initial encounter for closed fracture: Secondary | ICD-10-CM | POA: Insufficient documentation

## 2013-10-21 DIAGNOSIS — I10 Essential (primary) hypertension: Secondary | ICD-10-CM | POA: Insufficient documentation

## 2013-10-21 DIAGNOSIS — K589 Irritable bowel syndrome without diarrhea: Secondary | ICD-10-CM | POA: Insufficient documentation

## 2013-10-21 MED ORDER — HYDROCODONE-ACETAMINOPHEN 5-325 MG PO TABS: 1.0000 | ORAL_TABLET | Freq: Four times a day (QID) | ORAL | Status: DC | PRN

## 2013-10-21 MED ORDER — IBUPROFEN 400 MG PO TABS
600.0000 mg | ORAL_TABLET | Freq: Once | ORAL | Status: AC
  Administered 2013-10-21: 400 mg via ORAL
  Filled 2013-10-21 (×2): qty 1

## 2013-10-21 MED ORDER — IBUPROFEN 600 MG PO TABS: 600.0000 mg | ORAL_TABLET | Freq: Four times a day (QID) | ORAL | Status: DC | PRN

## 2013-10-21 NOTE — ED Provider Notes (Signed)
CSN: [id number]     Arrival date & time 10/21/13  0240 History   First MD Initiated Contact with Patient 10/21/13 0350     Chief Complaint  Patient presents with  . Ankle Pain     (Consider location/radiation/quality/duration/timing/severity/associated sxs/prior Treatment) HPI Comments: SUBJECTIVE: [name] is a 38 y.o. female who complains of inversion injury to the left ankle around 8 pm y'day. Immediate symptoms: immediate pain, immediate swelling, inability to bear weight directly after injury. Symptoms have been chronic and worsening since that time. Prior history of related problems: no prior problems with this area in the past. There is pain and swelling at the lateral aspect of that ankle. PT heard a pop when the injury occurred.   Patient is a 38 y.o. female presenting with ankle pain. The history is provided by the patient.  Ankle Pain   Past Medical History  Diagnosis Date  . Hypertension   . Ovarian cyst     1999; surgically removed  . OSA (obstructive sleep apnea)     on CPAP   . IBS (irritable bowel syndrome)   . 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 Depression   . Anxiety   . Fibroid     age 6  . Termination of pregnancy     due to cardiac risk  . DVT (deep venous thrombosis)     2014  . CHF (congestive heart failure)   . Gallstones    Past Surgical History  Procedure Laterality Date  . Abdominal surgery      several  . Cesarean section    . Cholecystectomy    . Foot surgery Right   . Tubal ligation  1999   Family History  Problem Relation Age of Onset  . Emphysema Maternal Grandmother     smoked  . Allergies Daughter   . Heart disease Maternal Grandmother 30    MI  . Rheum arthritis Mother    History  Substance Use Topics  . Smoking status: Never Smoker   . Smokeless tobacco: Never Used  . Alcohol Use: No   OB History   Grav Para Term Preterm Abortions TAB SAB Ect Mult Living   2 1   1 1    1       Review of Systems  Constitutional: Positive for activity change.  Musculoskeletal: Positive for arthralgias and gait problem.  Skin: Negative for rash and wound.  Hematological: Does not bruise/bleed easily.      Allergies  Ivp dye; Vancomycin; Aspirin; Ciprofloxacin; and Sulfa antibiotics  Home Medications   Prior to Admission medications   Medication Sig Start Date End Date Taking? Authorizing Provider  Armodafinil (NUVIGIL) 250 MG tablet Take 250 mg by mouth daily.   Yes Historical Provider, MD  Aspirin-Acetaminophen-Caffeine (EXCEDRIN PO) Take 2 tablets by mouth every 6 (six) hours as needed (headache).   Yes Historical Provider, MD  buPROPion (WELLBUTRIN XL) 150 MG 24 hr tablet Take 450 mg by mouth daily.   Yes Historical Provider, MD  carvedilol (COREG) 25 MG tablet Take 1 tablet (25 mg total) by mouth 2 (two) times daily with a meal. 08/08/13  Yes Lelon Perla, MD  colesevelam (WELCHOL) 625 MG tablet Take 625 mg by mouth daily as needed (ibs).   Yes Historical Provider, MD  cyclobenzaprine (FLEXERIL) 10 MG tablet Take 1 tablet (10 mg total) by mouth 3 (three) times daily as needed for muscle spasms. 08/02/13  Yes Olevia Bowens  Smith, DO  dicyclomine (BENTYL) 10 MG capsule Take 10 mg by mouth 3 (three) times daily as needed for spasms.   Yes Historical Provider, MD  FLUoxetine (PROZAC) 20 MG capsule Take 20 mg by mouth every morning.  01/31/13  Yes Historical Provider, MD  fluticasone (FLONASE) 50 MCG/ACT nasal spray Place 1 spray into both nostrils at bedtime. Before donning CPAP 05/23/13  Yes Rowe Clack, MD  furosemide (LASIX) 40 MG tablet Take 80mg  in the morning and 40mg  in the evening for 5 days 10/03/13  Yes Charles B. Karle Starch, MD  gabapentin (NEURONTIN) 100 MG capsule Take 1 capsule (100 mg total) by mouth 2 (two) times daily. 08/09/13  Yes Lyndal Pulley, DO  gabapentin (NEURONTIN) 300 MG capsule Take 300 mg by mouth at bedtime. Daily at bedtime. 05/29/13  Yes Lyndal Pulley, DO  hydrOXYzine (ATARAX/VISTARIL) 25 MG tablet Take 1 tablet (25 mg total) by mouth 3 (three) times daily as needed for anxiety. 10/10/13  Yes Lyndal Pulley, DO  losartan (COZAAR) 50 MG tablet Take 1 tablet (50 mg total) by mouth daily. 10/20/13  Yes Lelon Perla, MD  potassium chloride SA (K-DUR,KLOR-CON) 20 MEQ tablet Take 2 tablets (40 mEq total) by mouth 2 (two) times daily. 02/17/13  Yes Lelon Perla, MD  Skin Protectants, Misc. (EUCERIN) cream Apply 1 application topically as needed for dry skin.   Yes Historical Provider, MD  spironolactone (ALDACTONE) 25 MG tablet Take 1 tablet (25 mg total) by mouth daily. 10/20/13  Yes Lelon Perla, MD  sucralfate (CARAFATE) 1 G tablet Take 1 g by mouth daily.   Yes Historical Provider, MD  traMADol (ULTRAM) 50 MG tablet Take 1 tablet (50 mg total) by mouth at bedtime as needed for moderate pain. 10/03/13  Yes Charles B. Karle Starch, MD  HYDROcodone-acetaminophen (NORCO/VICODIN) 5-325 MG per tablet Take 1 tablet by mouth every 6 (six) hours as needed. 10/21/13   Varney Biles, MD  ibuprofen (ADVIL,MOTRIN) 600 MG tablet Take 1 tablet (600 mg total) by mouth every 6 (six) hours as needed. 10/21/13   Varney Biles, MD  pantoprazole (PROTONIX) 20 MG tablet Take 1 tablet (20 mg total) by mouth 2 (two) times daily. 07/27/13   Janett Billow D. Zehr, PA-C   BP 131/86  Pulse 97  Ht 5\' 5"  (1.651 m)  Wt 254 lb (115.214 kg)  BMI 42.27 kg/m2  SpO2 98%  LMP 09/30/2013 Physical Exam  Nursing note and vitals reviewed. Constitutional: She appears well-developed.  HENT:  Head: Atraumatic.  Eyes: Conjunctivae are normal.  Neck: Neck supple.  Musculoskeletal:  Left ankle - lateral malleoli swelling/edema with tenderness to palpation around the malleoli and the proximal foot laterally. No laxity. Tenderness worse with inversion. Neurovascularly intact.    ED Course  Procedures (including critical care time) Labs Review Labs Reviewed - No data to  display  Imaging Review Dg Ankle Complete Left  10/21/2013   CLINICAL DATA:  Injury  EXAM: LEFT ANKLE COMPLETE - 3+ VIEW  COMPARISON:  None.  FINDINGS: Prominent soft tissue swelling present at the lateral malleolus. Two small osseous densities seen at the distal aspect of the fibular are suspicious for small acute fracture fragments. Tiny osseous density adjacent to the posterior malleolus on lateral projection may represent an additional small fracture fragment. Ankle mortise is approximated. No other fracture.  Prominent plantar calcaneal spur noted.  IMPRESSION: 1. Two small osseous densities at the distal aspect of the lateral malleolus, suspicious for fracture  fragments. 2. Tiny osseous density adjacent to the posterior malleolus on lateral projection is suspicious for an additional small fracture fragment. 3. Prominent soft tissue swelling at the lateral malleolus.   Electronically Signed   By: Jeannine Boga M.D.   On: 10/21/2013 03:51     EKG Interpretation None      MDM   Final diagnoses:  Ankle fracture, left    Pt with ankle injury. Suspect avulsion type fracture based on the xrays. Cam Walker boot, Crutches, non weight bearing, RICE tx advised with sports med f/u.  Varney Biles, MD 10/21/13 [phone]

## 2013-10-21 NOTE — Discharge Instructions (Signed)
You have a small ankle fracture. Take the meds as prescribed, and see the specialist as requested.  NO WEIGHT BEARING. ICE THE LEG ARRESTIVELY.   Ankle Fracture A fracture is a break in the bone. A cast or splint is used to protect and keep your injured bone from moving.  HOME CARE INSTRUCTIONS   Use your crutches as directed.  To lessen the swelling, keep the injured leg elevated while sitting or lying down.  Apply ice to the injury for 15-20 minutes, 03-04 times per day while awake for 2 days. Put the ice in a plastic bag and place a thin towel between the bag of ice and your cast.  If you have a plaster or fiberglass cast:  Do not try to scratch the skin under the cast using sharp or pointed objects.  Check the skin around the cast every day. You may put lotion on any red or sore areas.  Keep your cast dry and clean.  If you have a plaster splint:  Wear the splint as directed.  You may loosen the elastic around the splint if your toes become numb, tingle, or turn cold or blue.  Do not put pressure on any part of your cast or splint; it may break. Rest your cast only on a pillow the first 24 hours until it is fully hardened.  Your cast or splint can be protected during bathing with a plastic bag. Do not lower the cast or splint into water.  Take medications as directed by your caregiver. Only take over-the-counter or prescription medicines for pain, discomfort, or fever as directed by your caregiver.  Do not drive a vehicle until your caregiver specifically tells you it is safe to do so.  If your caregiver has given you a follow-up appointment, it is very important to keep that appointment. Not keeping the appointment could result in a chronic or permanent injury, pain, and disability. If there is any problem keeping the appointment, you must call back to this facility for assistance. SEEK IMMEDIATE MEDICAL CARE IF:   Your cast gets damaged or breaks.  You have continued  severe pain or more swelling than you did before the cast was put on.  Your skin or toenails below the injury turn blue or gray, or feel cold or numb.  There is a bad smell or new stains and/or purulent (pus like) drainage coming from under the cast. If you do not have a window in your cast for observing the wound, a discharge or minor bleeding may show up as a stain on the outside of your cast. Report these findings to your caregiver. MAKE SURE YOU:   Understand these instructions.  Will watch your condition.  Will get help right away if you are not doing well or get worse. Document Released: 05/08/2000 Document Revised: 08/03/2011 Document Reviewed: 12/08/2012 [facility] Patient Information 2014 [place], [place]. RICE: Routine Care for Injuries The routine care of many injuries includes Rest, Ice, Compression, and Elevation (RICE). HOME CARE INSTRUCTIONS  Rest is needed to allow your body to heal. Routine activities can usually be resumed when comfortable. Injured tendons and bones can take up to 6 weeks to heal. Tendons are the cord-like structures that attach muscle to bone.  Ice following an injury helps keep the swelling down and reduces pain.  Put ice in a plastic bag.  Place a towel between your skin and the bag.  Leave the ice on for 15-20 minutes, 03-04 times a day. Do this  while awake, for the first 24 to 48 hours. After that, continue as directed by your caregiver.  Compression helps keep swelling down. It also gives support and helps with discomfort. If an elastic bandage has been applied, it should be removed and reapplied every 3 to 4 hours. It should not be applied tightly, but firmly enough to keep swelling down. Watch fingers or toes for swelling, bluish discoloration, coldness, numbness, or excessive pain. If any of these problems occur, remove the bandage and reapply loosely. Contact your caregiver if these problems continue.  Elevation helps reduce swelling and  decreases pain. With extremities, such as the arms, hands, legs, and feet, the injured area should be placed near or above the level of the heart, if possible. SEEK IMMEDIATE MEDICAL CARE IF:  You have persistent pain and swelling.  You develop redness, numbness, or unexpected weakness.  Your symptoms are getting worse rather than improving after several days. These symptoms may indicate that further evaluation or further X-rays are needed. Sometimes, X-rays may not show a small broken bone (fracture) until 1 week or 10 days later. Make a follow-up appointment with your caregiver. Ask when your X-ray results will be ready. Make sure you get your X-ray results. Document Released: 08/23/2000 Document Revised: 08/03/2011 Document Reviewed: 10/10/2010 [facility] Patient Information 2014 [place], [place].

## 2013-10-21 NOTE — ED Notes (Signed)
Pt says that she was walking and her left ankle rolled, she "heard two pop", has had throbbing/stiffness in left ankle.

## 2013-10-23 ENCOUNTER — Encounter: Payer: Self-pay | Admitting: Family Medicine

## 2013-10-23 ENCOUNTER — Ambulatory Visit (INDEPENDENT_AMBULATORY_CARE_PROVIDER_SITE_OTHER): Payer: 59 | Admitting: Family Medicine

## 2013-10-23 ENCOUNTER — Encounter (HOSPITAL_COMMUNITY): Payer: 59

## 2013-10-23 ENCOUNTER — Other Ambulatory Visit (INDEPENDENT_AMBULATORY_CARE_PROVIDER_SITE_OTHER): Payer: 59

## 2013-10-23 VITALS — BP 116/82 | HR 107 | Ht 65.0 in

## 2013-10-23 DIAGNOSIS — S8263XA Displaced fracture of lateral malleolus of unspecified fibula, initial encounter for closed fracture: Secondary | ICD-10-CM | POA: Insufficient documentation

## 2013-10-23 DIAGNOSIS — M25569 Pain in unspecified knee: Secondary | ICD-10-CM

## 2013-10-23 DIAGNOSIS — M25562 Pain in left knee: Secondary | ICD-10-CM

## 2013-10-23 HISTORY — DX: Displaced fracture of lateral malleolus of unspecified fibula, initial encounter for closed fracture: S82.63XA

## 2013-10-23 NOTE — Assessment & Plan Note (Signed)
Patient does have an avulsion fracture with injury to the ATFL as well as the calcaneofibular ligaments. Patient was put in the [facility] that fit her properly today and was fitted by me. We discussed icing procedure as well as over-the-counter medications that can be beneficial. We discussed that the prognosis of this is very good we will take somewhere in the ballpark of 6 weeks for full healing. Patient will wear the boot and was given some exercises specifically for rotation of the ankle. Patient will follow up in 3 weeks for further evaluation and treatment.  Spent greater than 25 minutes with patient face-to-face and had greater than 50% of counseling including as described above in assessment and plan.

## 2013-10-23 NOTE — Progress Notes (Signed)
  [name] [address] [place], [place] [address] Phone: [phone] Subjective:     CC: Left ankle and knee injury  [name] is a 38 y.o. female coming in with complaint of left ankle pain. Patient was walking and unfortunately rolled her ankle. Patient was seen in the emergency department and did have x-rays. X-rays were reviewed by me and shows the patient does have 2 avulsion fractures of the distal malleolus. No significant malalignment noted. Patient was given a boot as well as crutches and was sent here for further evaluation. Patient states that it is severely sore and still has some swelling. Patient is able to ambulate on it as long she is in the boot but states that the boot itself is too big. Patient rates his pain is 7/10. Denies any nighttime awakening. Patient has been taking over-the-counter medications with some improvement. Patient has not been icing on a regular routine.     Past medical history, social, surgical and family history all reviewed in electronic medical record.   Review of Systems: No headache, visual changes, nausea, vomiting, diarrhea, constipation, dizziness, abdominal pain, skin rash, fevers, chills, night sweats, weight loss, swollen lymph nodes, body aches, joint swelling, muscle aches, chest pain, shortness of breath, mood changes.   Objective Blood pressure 116/82, pulse 107, height 5\' 5"  (1.651 m), last menstrual period 09/30/2013, SpO2 96.00%.  General: No apparent distress alert and oriented x3 mood and affect normal, dressed appropriately.  HEENT: Pupils equal, extraocular movements intact  Respiratory: [name] speak in full sentences and does not appear short of breath  Cardiovascular: No lower extremity edema, non tender, no erythema  Skin: Warm dry intact with no signs of infection or rash on extremities or on axial skeleton.  Abdomen: Soft nontender  Neuro: Cranial nerves II through XII  are intact, neurovascularly intact in all extremities with 2+ DTRs and 2+ pulses.  Lymph: No lymphadenopathy of posterior or anterior cervical chain or axillae bilaterally.  Gait normal with good balance and coordination.  MSK:  Non tender with full range of motion and good stability and symmetric strength and tone of shoulders, elbows, wrist, hip, knee and bilaterally.  Ankle: Left Moderate swelling noted Range of motion is full in all directions. Strength is 5/5 in all directions. Stable lateral and medial ligaments; squeeze test and kleiger test unremarkable; Talar dome nontender; No pain at base of 5th MT; No tenderness over cuboid; No tenderness over N spot or navicular prominence  No sign of peroneal tendon subluxations or tenderness to palpation Negative tarsal tunnel tinel's Able to walk 4 steps. Negative anterior drawer     Impression and Recommendations:     This case required medical decision making of moderate complexity.

## 2013-10-23 NOTE — Patient Instructions (Signed)
We changed your boot Ice bath 20 minutes 2 times a day Tylenol 650 mg three times daily Any walking next 3 weeks wear boot When sitting can come out of boot and move the ankle around.  Compression if it feels good The knee try exercises 3 times a week See me again in 2 weeks.

## 2013-10-25 ENCOUNTER — Ambulatory Visit (HOSPITAL_COMMUNITY)
Admission: RE | Admit: 2013-10-25 | Discharge: 2013-10-25 | Disposition: A | Discharge status: Home / Self Care | Payer: 59 | Source: Ambulatory Visit | Attending: Cardiovascular Disease | Admitting: Cardiovascular Disease

## 2013-10-25 ENCOUNTER — Encounter (HOSPITAL_COMMUNITY): Payer: 59

## 2013-10-25 DIAGNOSIS — I059 Rheumatic mitral valve disease, unspecified: Secondary | ICD-10-CM

## 2013-10-25 DIAGNOSIS — I428 Other cardiomyopathies: Secondary | ICD-10-CM | POA: Insufficient documentation

## 2013-10-25 DIAGNOSIS — O903 Peripartum cardiomyopathy: Secondary | ICD-10-CM

## 2013-10-25 NOTE — Progress Notes (Signed)
2D Echo Performed 10/25/2013    Marygrace Drought, RCS

## 2013-10-26 ENCOUNTER — Telehealth (HOSPITAL_COMMUNITY): Payer: Self-pay | Admitting: *Deleted

## 2013-10-27 ENCOUNTER — Other Ambulatory Visit: Payer: 59

## 2013-10-27 ENCOUNTER — Telehealth: Payer: Self-pay | Admitting: Cardiology

## 2013-10-27 ENCOUNTER — Encounter (HOSPITAL_COMMUNITY): Payer: 59

## 2013-10-27 NOTE — Telephone Encounter (Signed)
Notified of echo results.

## 2013-10-27 NOTE — Telephone Encounter (Signed)
Follow Up    Pt calling returning a call from yesterday. Please call.

## 2013-10-30 ENCOUNTER — Encounter (HOSPITAL_COMMUNITY): Payer: 59

## 2013-10-30 ENCOUNTER — Encounter: Payer: Self-pay | Admitting: Family Medicine

## 2013-10-30 ENCOUNTER — Other Ambulatory Visit: Payer: Self-pay | Admitting: Internal Medicine

## 2013-10-30 ENCOUNTER — Ambulatory Visit (INDEPENDENT_AMBULATORY_CARE_PROVIDER_SITE_OTHER): Payer: 59 | Admitting: Family Medicine

## 2013-10-30 VITALS — BP 139/90 | Ht 65.0 in | Wt 248.0 lb

## 2013-10-30 DIAGNOSIS — R0989 Other specified symptoms and signs involving the circulatory and respiratory systems: Secondary | ICD-10-CM

## 2013-10-30 DIAGNOSIS — R0609 Other forms of dyspnea: Secondary | ICD-10-CM

## 2013-10-30 DIAGNOSIS — S8990XA Unspecified injury of unspecified lower leg, initial encounter: Secondary | ICD-10-CM

## 2013-10-30 DIAGNOSIS — S99929A Unspecified injury of unspecified foot, initial encounter: Secondary | ICD-10-CM

## 2013-10-30 DIAGNOSIS — R0600 Dyspnea, unspecified: Secondary | ICD-10-CM

## 2013-10-30 DIAGNOSIS — S99919A Unspecified injury of unspecified ankle, initial encounter: Secondary | ICD-10-CM

## 2013-10-30 DIAGNOSIS — S99912A Unspecified injury of left ankle, initial encounter: Secondary | ICD-10-CM

## 2013-10-30 MED ORDER — HYDROCODONE-ACETAMINOPHEN 5-325 MG PO TABS: 1.0000 | ORAL_TABLET | Freq: Four times a day (QID) | ORAL | Status: DC | PRN

## 2013-10-30 NOTE — Patient Instructions (Signed)
You have both an avulsion fracture of your lateral malleolus and an ankle sprain. These are treated similarly but typically take 6-8 weeks to completely recover (the first 2-4 weeks are usually quite painful). Ice the area for 15 minutes at a time, 3-4 times a day Aleve 2 tabs twice a day with food OR ibuprofen 3 tabs three times a day with food for pain and inflammation. Norco as needed for severe pain (no driving on this medicine). Elevate above the level of your heart when possible Crutches if needed to help with walking Bear weight when tolerated Generally after 1-2 weeks in the boot switch to laceup ankle brace to help with stability while you recover from this injury - wear until 6 weeks out at least. Come out of the boot/brace twice a day to do Up/down and alphabet exercises 2-3 sets of each. Consider physical therapy for strengthening and balance exercises in the future. If not improving as expected, we may repeat x-rays or consider further testing like an MRI. Follow up with me in 3-4 weeks for reevaluation.

## 2013-11-01 ENCOUNTER — Encounter (HOSPITAL_COMMUNITY): Payer: 59

## 2013-11-01 NOTE — Telephone Encounter (Signed)
Called pt no answer LMOm rx ready for pick-up.../lmb 

## 2013-11-02 ENCOUNTER — Encounter: Payer: Self-pay | Admitting: Family Medicine

## 2013-11-02 DIAGNOSIS — S99912A Unspecified injury of left ankle, initial encounter: Secondary | ICD-10-CM | POA: Insufficient documentation

## 2013-11-02 NOTE — Progress Notes (Signed)
Patient ID: [name], female   DOB: 16-Apr-1976, 38 y.o.   MRN: [id number]  PCP: [name], MD  Subjective:   HPI: Patient is a 38 y.o. female here for left ankle injury.  Patient reports on 5/29 she was walking when she stepped and inverted left ankle. Felt and heard two pops. This made her nauseous. + swelling. Using a cam walker and taking ibuprofen. No prior injuries that she is aware of. Found to have small densities posterior and lateral malleolus that could represent avulsion fractures.  Past Medical History  Diagnosis Date  . Hypertension   . Ovarian cyst     1999; surgically removed  . OSA (obstructive sleep apnea)     on CPAP   . IBS (irritable bowel syndrome)   . 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 Depression   . Anxiety   . Fibroid     age 89  . Termination of pregnancy     due to cardiac risk  . DVT (deep venous thrombosis)     2014  . CHF (congestive heart failure)   . Gallstones     Current Outpatient Prescriptions on File Prior to Visit  Medication Sig Dispense Refill  . Armodafinil (NUVIGIL) 250 MG tablet Take 250 mg by mouth daily.      . Aspirin-Acetaminophen-Caffeine (EXCEDRIN PO) Take 2 tablets by mouth every 6 (six) hours as needed (headache).      Marland Kitchen buPROPion (WELLBUTRIN XL) 150 MG 24 hr tablet Take 450 mg by mouth daily.      . carvedilol (COREG) 25 MG tablet Take 1 tablet (25 mg total) by mouth 2 (two) times daily with a meal.  60 tablet  3  . colesevelam (WELCHOL) 625 MG tablet Take 625 mg by mouth daily as needed (ibs).      . cyclobenzaprine (FLEXERIL) 10 MG tablet Take 1 tablet (10 mg total) by mouth 3 (three) times daily as needed for muscle spasms.  30 tablet  0  . dicyclomine (BENTYL) 10 MG capsule Take 10 mg by mouth 3 (three) times daily as needed for spasms.      Marland Kitchen FLUoxetine (PROZAC) 20 MG capsule Take 20 mg by mouth every morning.       . fluticasone (FLONASE) 50 MCG/ACT nasal spray Place 1  spray into both nostrils at bedtime. Before donning CPAP  16 g  2  . furosemide (LASIX) 40 MG tablet Take 80mg  in the morning and 40mg  in the evening for 5 days  60 tablet  0  . gabapentin (NEURONTIN) 100 MG capsule Take 1 capsule (100 mg total) by mouth 2 (two) times daily.  180 capsule  0  . gabapentin (NEURONTIN) 300 MG capsule Take 300 mg by mouth at bedtime. Daily at bedtime.      . hydrOXYzine (ATARAX/VISTARIL) 25 MG tablet Take 1 tablet (25 mg total) by mouth 3 (three) times daily as needed for anxiety.  30 tablet  0  . ibuprofen (ADVIL,MOTRIN) 600 MG tablet Take 1 tablet (600 mg total) by mouth every 6 (six) hours as needed.  30 tablet  0  . losartan (COZAAR) 50 MG tablet Take 1 tablet (50 mg total) by mouth daily.  90 tablet  3  . pantoprazole (PROTONIX) 20 MG tablet Take 1 tablet (20 mg total) by mouth 2 (two) times daily.  60 tablet  6  . potassium chloride SA (K-DUR,KLOR-CON) 20 MEQ tablet Take 2 tablets (40 mEq total)  by mouth 2 (two) times daily.  124 tablet  12  . Skin Protectants, Misc. (EUCERIN) cream Apply 1 application topically as needed for dry skin.      Marland Kitchen spironolactone (ALDACTONE) 25 MG tablet Take 1 tablet (25 mg total) by mouth daily.  90 tablet  3  . sucralfate (CARAFATE) 1 G tablet Take 1 g by mouth daily.      . traMADol (ULTRAM) 50 MG tablet Take 1 tablet (50 mg total) by mouth at bedtime as needed for moderate pain.  30 tablet  0   No current facility-administered medications on file prior to visit.    Past Surgical History  Procedure Laterality Date  . Abdominal surgery      several  . Cesarean section    . Cholecystectomy    . Foot surgery Right   . Tubal ligation  1999    Allergies  Allergen Reactions  . Ivp Dye [Iodinated Diagnostic Agents] Other (See Comments)    Did something to my liver and kidneys told not to use it  . Vancomycin Other (See Comments)    "did something to my kidneys"; went into kidney failure  . Aspirin Other (See Comments)     Wheezing; but patient still takes if she needs to  . Ciprofloxacin Itching and Rash  . Sulfa Antibiotics Itching and Rash    History   Social History  . Marital Status: Married    Spouse Name: N/A    Number of Children: 1  . Years of Education: N/A   Occupational History  . stay at home mom    Social History Main Topics  . Smoking status: Never Smoker   . Smokeless tobacco: Never Used  . Alcohol Use: No  . Drug Use: No  . Sexual Activity: Not on file   Other Topics Concern  . Not on file   Social History Narrative  . No narrative on file    Family History  Problem Relation Age of Onset  . Emphysema Maternal Grandmother     smoked  . Allergies Daughter   . Heart disease Maternal Grandmother 24    MI  . Rheum arthritis Mother     BP 139/90  Ht 5\' 5"  (1.651 m)  Wt 248 lb (112.492 kg)  BMI 41.27 kg/m2  LMP 09/30/2013  Review of Systems: See HPI above.    Objective:  Physical Exam:  Gen: NAD  Left ankle/foot: Mod lateral swelling and mild bruising.  No other deformity. Mod limitation ROM all directions. TTP lateral malleolus, over ATFL.  No other tenderness. Painful 1+ ant drawer and talar tilt.   Negative syndesmotic compression. Thompsons test negative. NV intact distally.    Assessment & Plan:  1. Left ankle injury - believe she sustained an ankle sprain and lateral malleolus avulsion fracture.  Expect 6-8 weeks to recover.  Icing, nsaids with norco as needed.  Elevation and cam walker then transition to ASO after 1-2 weeks.  Crutches if needed.  F/u in 3-4 weeks.

## 2013-11-02 NOTE — Assessment & Plan Note (Signed)
believe she sustained an ankle sprain and lateral malleolus avulsion fracture.  Expect 6-8 weeks to recover.  Icing, nsaids with norco as needed.  Elevation and cam walker then transition to ASO after 1-2 weeks.  Crutches if needed.  F/u in 3-4 weeks.

## 2013-11-03 ENCOUNTER — Encounter (HOSPITAL_COMMUNITY): Payer: 59

## 2013-11-06 ENCOUNTER — Encounter (HOSPITAL_COMMUNITY): Payer: 59

## 2013-11-08 ENCOUNTER — Encounter (HOSPITAL_COMMUNITY): Payer: 59

## 2013-11-10 ENCOUNTER — Encounter (HOSPITAL_COMMUNITY): Payer: 59

## 2013-11-13 ENCOUNTER — Encounter (HOSPITAL_COMMUNITY): Payer: 59

## 2013-11-15 ENCOUNTER — Encounter (HOSPITAL_COMMUNITY): Payer: 59

## 2013-11-17 ENCOUNTER — Encounter (HOSPITAL_COMMUNITY): Payer: 59

## 2013-11-20 ENCOUNTER — Encounter (HOSPITAL_COMMUNITY): Payer: 59

## 2013-11-22 ENCOUNTER — Encounter (HOSPITAL_COMMUNITY): Payer: 59

## 2013-11-24 ENCOUNTER — Encounter (HOSPITAL_COMMUNITY): Payer: 59

## 2013-11-27 ENCOUNTER — Encounter (HOSPITAL_COMMUNITY): Payer: 59

## 2013-11-27 ENCOUNTER — Other Ambulatory Visit: Payer: 59

## 2013-11-27 ENCOUNTER — Encounter: Payer: Self-pay | Admitting: *Deleted

## 2013-11-27 ENCOUNTER — Encounter: Payer: Self-pay | Admitting: Family Medicine

## 2013-11-27 ENCOUNTER — Ambulatory Visit (INDEPENDENT_AMBULATORY_CARE_PROVIDER_SITE_OTHER): Payer: 59 | Admitting: Family Medicine

## 2013-11-27 VITALS — BP 126/82 | HR 94 | Ht 65.0 in | Wt 245.0 lb

## 2013-11-27 DIAGNOSIS — S99919A Unspecified injury of unspecified ankle, initial encounter: Secondary | ICD-10-CM

## 2013-11-27 DIAGNOSIS — S99912D Unspecified injury of left ankle, subsequent encounter: Secondary | ICD-10-CM

## 2013-11-27 DIAGNOSIS — S8990XA Unspecified injury of unspecified lower leg, initial encounter: Secondary | ICD-10-CM

## 2013-11-27 DIAGNOSIS — S8262XG Displaced fracture of lateral malleolus of left fibula, subsequent encounter for closed fracture with delayed healing: Secondary | ICD-10-CM

## 2013-11-27 DIAGNOSIS — S99929A Unspecified injury of unspecified foot, initial encounter: Secondary | ICD-10-CM

## 2013-11-27 DIAGNOSIS — Z5189 Encounter for other specified aftercare: Secondary | ICD-10-CM

## 2013-11-27 DIAGNOSIS — S8290XD Unspecified fracture of unspecified lower leg, subsequent encounter for closed fracture with routine healing: Secondary | ICD-10-CM

## 2013-11-27 NOTE — Assessment & Plan Note (Signed)
Patient is making some progress but very slowly. I do feel that patient's dressing this area somewhat she will make better improvement. Encourage her to take the over-the-counter vitamin D supplementation we discussed previously. Patient has to avoid anti-inflammatories secondary to her cardiac history. Patient is able to start cardiac rehabilitation again on a regular basis. We discussed the progression of getting out of the boot over the course of the next 10 days and then wearing his lace up brace. We discussed icing protocol. Patient will come back again in in 3-4 weeks for further evaluation and we will ultrasound again to make sure patient is healing regularly.  Spent greater than 25 minutes with patient face-to-face and had greater than 50% of counseling including as described above in assessment and plan.

## 2013-11-27 NOTE — Progress Notes (Signed)
[name], [name] Phone: [phone] Subjective:     CC: Left ankle fracture followup  UVO:[id number] [name] is a 38 y.o. female coming in with complaint of left ankle pain. Patient was previously seen and did have an avulsion fracture of the malleolus. Patient has been in a Banker, icing protocol, and we discussed over-the-counter medications. Patient states overall she is approximately 60% better. Patient continues to wear the Cam Walker as well as the lace up brace regularly. 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 Patient has not been doing home exercises and has not been able to go to cardio rehabilitation. Patient is not taking any medications on a regular basis at this time for the pain. States that she still has the soreness but nothing as bad as it was previously.     Past medical history, social, surgical and family history all reviewed in electronic medical record.   Review of Systems: No headache, visual changes, nausea, vomiting, diarrhea, constipation, dizziness, abdominal pain, skin rash, fevers, chills, night sweats, weight loss, swollen lymph nodes, body aches, joint swelling, muscle aches, chest pain, shortness of breath, mood changes.   Objective Blood pressure 126/82, pulse 94, height 5\' 5"  (1.651 m), weight 245 lb (111.131 kg), SpO2 99.00%.  General: No apparent distress alert and oriented x3 mood and affect normal, dressed appropriately.  HEENT: Pupils equal, extraocular movements intact  Respiratory: [name] speak in full sentences and does not appear short of breath  Cardiovascular: No lower extremity edema, non tender, no erythema  Skin: Warm dry intact with no signs of infection or rash on extremities or on axial skeleton.  Abdomen: Soft nontender  Neuro: Cranial nerves II through XII are intact, neurovascularly intact in all extremities with 2+ DTRs and 2+ pulses.  Lymph: No lymphadenopathy  of posterior or anterior cervical chain or axillae bilaterally.  Gait normal with good balance and coordination.  MSK:  Non tender with full range of motion and good stability and symmetric strength and tone of shoulders, elbows, wrist, hip, knee and bilaterally.  Ankle: Left Mild swelling noted with improvement from previous exam Range of motion is full in all directions. Strength is 5/5 in all directions. Stable lateral and medial ligaments; squeeze test and kleiger test unremarkable; Talar dome nontender; No pain at base of 5th MT; No tenderness over cuboid; No tenderness over N spot or navicular prominence No sign of peroneal tendon subluxations or tenderness to palpation Negative tarsal tunnel tinel's Able to walk 4 steps. Negative anterior drawer  MSK US performed of: Left ankle This study was ordered, performed, and interpreted by Charlann Boxer D.O.  Foot/Ankle:   All structures visualized.   Talar dome unremarkable  Ankle mortise without effusion. Peroneus longus and brevis tendons unremarkable on long and transverse views without sheath effusions. Posterior tibialis, flexor hallucis longus, and flexor digitorum longus tendons unremarkable on long and transverse views without sheath effusions. Achilles tendon visualized along length of tendon and unremarkable on long and transverse views without sheath effusion. Anterior Talofibular Ligament and Calcaneofibular Ligaments unremarkable and intact. The patient does have some mild callus formation on the inferior lateral aspect of the malleolus. This is where patient's previous avulsion fracture was noted. Patient did have one superior to this and still has some mild displacement of 0.3 cm. Deltoid Ligament unremarkable and intact. Plantar fascia intact and without effusion, normal thickness. No increased doppler signal, cap sign, or thickening of tibial  cortex. Power doppler signal normal.  IMPRESSION: One of 2 healing avulsion  fractures.       Impression and Recommendations:     This case required medical decision making of moderate complexity.

## 2013-11-27 NOTE — Patient Instructions (Signed)
Good to see you Wear the boot another 10 days.  Do not wear it on the bike though.  Drop the seat one level on the bike with rehab.  Ice still is your friend after activity.  New exercises 3 times a week starting now.  After 10 days still wear brace daily for 3 weeks total.  Then with activity or exercise another 6 weeks.  Come back again in 4 weeks to check the healing.

## 2013-11-29 ENCOUNTER — Encounter (HOSPITAL_COMMUNITY): Payer: 59

## 2013-12-01 ENCOUNTER — Encounter (HOSPITAL_COMMUNITY): Payer: 59

## 2013-12-04 ENCOUNTER — Encounter (HOSPITAL_COMMUNITY): Payer: 59

## 2013-12-06 ENCOUNTER — Encounter (HOSPITAL_COMMUNITY): Payer: 59

## 2013-12-08 ENCOUNTER — Encounter (HOSPITAL_COMMUNITY): Payer: 59

## 2013-12-11 ENCOUNTER — Encounter (HOSPITAL_COMMUNITY): Payer: 59

## 2013-12-13 ENCOUNTER — Encounter (HOSPITAL_COMMUNITY): Payer: 59

## 2013-12-15 ENCOUNTER — Encounter (HOSPITAL_COMMUNITY): Payer: 59

## 2013-12-18 ENCOUNTER — Encounter (HOSPITAL_COMMUNITY): Payer: 59

## 2013-12-22 ENCOUNTER — Telehealth: Payer: Self-pay | Admitting: Cardiology

## 2013-12-22 NOTE — Telephone Encounter (Signed)
PT LEFT A MESSAGE RE NUMBNESS IN HAND AND ARM X 1 WEEK, HAS TAKEN ADVIL, NOT GETTING ANY BETTER, WANTS TO BE SEEN TODAY, PLS CALL

## 2013-12-22 NOTE — Telephone Encounter (Signed)
I talked to this pt. And instructed her to make an appt. With an ortho doctor or her PCP, pt stated understanding of instructions

## 2013-12-25 ENCOUNTER — Encounter: Payer: Self-pay | Admitting: Family Medicine

## 2013-12-25 ENCOUNTER — Ambulatory Visit (INDEPENDENT_AMBULATORY_CARE_PROVIDER_SITE_OTHER): Payer: 59 | Admitting: Family Medicine

## 2013-12-25 ENCOUNTER — Other Ambulatory Visit (INDEPENDENT_AMBULATORY_CARE_PROVIDER_SITE_OTHER): Payer: 59

## 2013-12-25 ENCOUNTER — Ambulatory Visit (INDEPENDENT_AMBULATORY_CARE_PROVIDER_SITE_OTHER)
Admission: RE | Admit: 2013-12-25 | Discharge: 2013-12-25 | Disposition: A | Discharge status: Home / Self Care | Payer: 59 | Source: Ambulatory Visit | Attending: Family Medicine | Admitting: Family Medicine

## 2013-12-25 ENCOUNTER — Encounter (HOSPITAL_COMMUNITY): Payer: 59

## 2013-12-25 VITALS — BP 138/84 | HR 94 | Ht 65.0 in | Wt 253.0 lb

## 2013-12-25 DIAGNOSIS — M542 Cervicalgia: Secondary | ICD-10-CM

## 2013-12-25 DIAGNOSIS — R209 Unspecified disturbances of skin sensation: Secondary | ICD-10-CM

## 2013-12-25 DIAGNOSIS — R200 Anesthesia of skin: Secondary | ICD-10-CM

## 2013-12-25 DIAGNOSIS — R202 Paresthesia of skin: Principal | ICD-10-CM

## 2013-12-25 LAB — BASIC METABOLIC PANEL
BUN: 10 mg/dL (ref 6–23)
CO2: 28 mEq/L (ref 19–32)
Calcium: 8.9 mg/dL (ref 8.4–10.5)
Chloride: 103 mEq/L (ref 96–112)
Creatinine, Ser: 0.8 mg/dL (ref 0.4–1.2)
GFR: 110.22 mL/min (ref >60.00)
Glucose, Bld: 92 mg/dL (ref 70–99)
Potassium: 4.1 mEq/L (ref 3.5–5.1)
Sodium: 134 mEq/L — ABNORMAL LOW (ref 135–145)

## 2013-12-25 LAB — FERRITIN: Ferritin: 20.3 ng/mL (ref 10.0–291.0)

## 2013-12-25 LAB — CBC WITH DIFFERENTIAL/PLATELET
Basophils Absolute: 0 10*3/uL (ref 0.0–0.1)
Basophils Relative: 0.4 % (ref 0.0–3.0)
Eosinophils Absolute: 0.2 10*3/uL (ref 0.0–0.7)
Eosinophils Relative: 2.2 % (ref 0.0–5.0)
HCT: 37 % (ref 36.0–46.0)
Hemoglobin: 12.1 g/dL (ref 12.0–15.0)
Lymphocytes Relative: 31.7 % (ref 12.0–46.0)
Lymphs Abs: 2.5 10*3/uL (ref 0.7–4.0)
MCHC: 32.7 g/dL (ref 30.0–36.0)
MCV: 86.1 fl (ref 78.0–100.0)
Monocytes Absolute: 0.5 10*3/uL (ref 0.1–1.0)
Monocytes Relative: 7 % (ref 3.0–12.0)
Neutro Abs: 4.6 10*3/uL (ref 1.4–7.7)
Neutrophils Relative %: 58.7 % (ref 43.0–77.0)
Platelets: 264 10*3/uL (ref 150.0–400.0)
RBC: 4.29 Mil/uL (ref 3.87–5.11)
RDW: 14.4 % (ref 11.5–15.5)
WBC: 7.8 10*3/uL (ref 4.0–10.5)

## 2013-12-25 LAB — CALCIUM, IONIZED: Calcium, Ion: 1.27 mmol/L (ref 1.12–1.32)

## 2013-12-25 LAB — SEDIMENTATION RATE: Sed Rate: 27 mm/hr — ABNORMAL HIGH (ref 0–22)

## 2013-12-25 LAB — TSH: TSH: 2.11 u[IU]/mL (ref 0.35–4.50)

## 2013-12-25 NOTE — Progress Notes (Signed)
  [name] [place], [name] [address] Phone: [phone] Subjective:     CC: Bilateral hand numbness  INO:[id number] [name] is a 38 y.o. female coming in with complaint of bilateral hand pain. Patient states that she has had numbness for quite some time. Patient has noticed though that it has increased in frequency and duration. 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 Patient states since then they have not truly woken up. Patient denies any change in her medications, changing back, or any true injury. Denies any worsening of neck pain which I have seen for her previously. Patient x-ray states that her neck and back seems to be doing relatively well overall. The patient denies that it is with certain positions. Patient states that it just seems to be all that time. Patient past medical history is significant for asthma which she states is well controlled as well as postpartum cardiomyopathy. States that this is affecting her job and needs to have feeling in her hands.     Past medical history, social, surgical and family history all reviewed in electronic medical record.   Review of Systems: No headache, visual changes, nausea, vomiting, diarrhea, constipation, dizziness, abdominal pain, skin rash, fevers, chills, night sweats, weight loss, swollen lymph nodes, body aches, joint swelling, muscle aches, chest pain, shortness of breath, mood changes.   Objective Blood pressure 138/84, pulse 94, height 5\' 5"  (1.651 m), weight 253 lb (114.76 kg), SpO2 99.00%.  General: No apparent distress alert and oriented x3 mood and affect normal, dressed appropriately.  HEENT: Pupils equal, extraocular movements intact  Respiratory: [name] speak in full sentences and does not appear short of breath    Cardiovascular: No lower extremity edema, non tender, no erythema  Skin: Warm dry intact with no signs of infection or rash on extremities or on axial skeleton.  Abdomen: Soft nontender  Neuro: Cranial nerves II through XII are intact, neurovascularly intact in all extremities with 2+ DTRs and 2+ pulses.  Lymph: No lymphadenopathy of posterior or anterior cervical chain or axillae bilaterally.  Gait normal with good balance and coordination.  MSK:  Non tender with full range of motion and good stability and symmetric strength and tone of shoulders, elbows,  hip, knee and ankles bilaterally.  Wrist: Bilateral Inspection normal with no visible erythema or swelling. ROM smooth and normal with good flexion and extension and ulnar/radial deviation that is symmetrical with opposite wrist. Palpation is normal over metacarpals, navicular, lunate, and TFCC; tendons without tenderness/ swelling No snuffbox tenderness. No tenderness over Canal of Guyon. Strength 5/5 in all directions without pain. Negative Finkelstein, tinel's and phalens. Negative Watson's test.  Neck: Inspection unremarkable. No palpable stepoffs. Negative Spurling's maneuver. Full neck range of motion Grip strength and sensation normal in bilateral hands Strength good C4 to T1 distribution No sensory change to C4 to T1 Negative Hoffman sign bilaterally Reflexes normal   Impression and Recommendations:     This case required medical decision making of moderate complexity.

## 2013-12-25 NOTE — Assessment & Plan Note (Signed)
Patient does have bilateral hand numbness. Patient also has a past medical history significant for borderline anemia previously. Patient is an asthmatic and that could be contributing as well. Patient though has not had any exacerbations. Patient has no neck findings but I will get x-rays to rule out any bony abnormality. At this point I think labs would be necessary including thyroid, basic metabolic panel, as well as complete blood count and iron levels. Depending on findings we may need to do some supplementation. If there is no findings the patient remains symptomatic we may want to consider further workup with further imaging such as a cervical MRI to rule out any demyelination that could be contributing. Patient signs though are not consistent with any one nerve irritation. We also would consider further workup for autoimmune diseases if this continues. At this point and would rather treat conservatively with patient does have an increase in activity that could be contributing to the difficulty. Patient given home exercise program and will come back again in 2 weeks for further evaluation and treatment.  Spent greater than 25 minutes with patient face-to-face and had greater than 50% of counseling including as described above in assessment and plan.

## 2013-12-25 NOTE — Patient Instructions (Signed)
Good to see you So labs and xray today to rule out all those little things we canchange at this time.  Exercises for your back 3 times a week.  You hand seems good overall at this time.  I will release results when I get them.  Try the wrist exercises 3 times a week as well.  Squishy ball and rubber band exercises daily.  Come back in 2 weeks.

## 2013-12-27 ENCOUNTER — Encounter (HOSPITAL_COMMUNITY)
Admission: RE | Admit: 2013-12-27 | Discharge: 2013-12-27 | Disposition: A | Discharge status: Home / Self Care | Payer: 59 | Source: Ambulatory Visit | Attending: Cardiology | Admitting: Cardiology

## 2013-12-27 DIAGNOSIS — I428 Other cardiomyopathies: Secondary | ICD-10-CM | POA: Diagnosis present

## 2013-12-27 NOTE — Progress Notes (Signed)
Pt started cardiac rehab today.  Pt tolerated light exercise without difficulty.  Sinus tachycardia with frequent bigeminal PVC at rest.  Pt feels anxious denies chest pain or dyspnea.  PHQ-17.  Pt reports significant stress and anxiety about her cardiac health and physical well being. Pt is established with Dr. Toy Care, pt will call to schedule f/u appt in next week for medication adjustments.  Pt tearful as she discusses her anxiety about her illness with resulting deconditioning.  Pt offered emotional support and reassurance. Pt rehab goal is to get stronger so that she is able to participate in activities with her teenage daughter and so her daughter will feel more confident in her mothers wellbeing.  Pt would like to increase her activity while decreasing symptoms.  Pt reports her husband is not always understanding with her frequent fatigue and this is a major stressor. Pt oriented to exercise equipment and routine.  Pt had difficulty with airdyne therefore changed to recumbent bike without difficulty.  Understanding verbalized.

## 2013-12-29 ENCOUNTER — Encounter (HOSPITAL_COMMUNITY)
Admission: RE | Admit: 2013-12-29 | Discharge: 2013-12-29 | Disposition: A | Discharge status: Home / Self Care | Payer: 59 | Source: Ambulatory Visit | Attending: Cardiology | Admitting: Cardiology

## 2013-12-29 ENCOUNTER — Encounter (HOSPITAL_COMMUNITY): Admission: RE | Admit: 2013-12-29 | Payer: 59 | Source: Ambulatory Visit

## 2013-12-29 DIAGNOSIS — I428 Other cardiomyopathies: Secondary | ICD-10-CM | POA: Diagnosis not present

## 2014-01-01 ENCOUNTER — Encounter (HOSPITAL_COMMUNITY): Payer: 59

## 2014-01-03 ENCOUNTER — Encounter (HOSPITAL_COMMUNITY)
Admission: RE | Admit: 2014-01-03 | Discharge: 2014-01-03 | Disposition: A | Discharge status: Home / Self Care | Payer: 59 | Source: Ambulatory Visit | Attending: Cardiology | Admitting: Cardiology

## 2014-01-03 DIAGNOSIS — I428 Other cardiomyopathies: Secondary | ICD-10-CM | POA: Diagnosis not present

## 2014-01-04 NOTE — Progress Notes (Signed)
[name]'s heart rate has been noted above 100 with a telemetry rhythm of Sinus Tach since she began cardiac rehab on 12/27/2013. Will fax exercise flow sheets to Dr. Jacalyn Lefevre  office for review with ECG tracings.

## 2014-01-05 ENCOUNTER — Telehealth: Payer: Self-pay | Admitting: *Deleted

## 2014-01-05 ENCOUNTER — Encounter (HOSPITAL_COMMUNITY)
Admission: RE | Admit: 2014-01-05 | Discharge: 2014-01-05 | Disposition: A | Discharge status: Home / Self Care | Payer: 59 | Source: Ambulatory Visit | Attending: Cardiology | Admitting: Cardiology

## 2014-01-05 DIAGNOSIS — R0602 Shortness of breath: Secondary | ICD-10-CM

## 2014-01-05 NOTE — Telephone Encounter (Signed)
Pt is at cardiac rehab and c/o SOB x 2 days. Her weight is up 4 labs since Wednesday. Spoke with pt, she is having trouble with SOB while lying flat. She is currently taking lasix 80 mg in the am and 40 mg pm. She will take 80 mg tonight and then 80 mg bid x 3 days. She will come to the office early next week for bmp Patient voiced understanding

## 2014-01-05 NOTE — Progress Notes (Signed)
Dr Stanford Breed reviewed [name]'s ECG tracings. No new orders received at this time. [name]'s weight is up 1.9 kg from Wednesday. [name] said she has had increased shortness of breath the past few day's. Oxygen saturation 96-97% on room air. Lung fields clear upon auscultation. Positive ankle edema noted. Telemetry rhythm Sinus tach 109. Dr Jacalyn Lefevre office called and notified of weight gain. Dr Stanford Breed to review flow sheets from cardiac rehab with weights his nurse Hilda Blades will call [name] at home with further instructions.

## 2014-01-07 ENCOUNTER — Telehealth: Payer: Self-pay | Admitting: Cardiology

## 2014-01-07 ENCOUNTER — Other Ambulatory Visit: Payer: Self-pay | Admitting: Internal Medicine

## 2014-01-07 NOTE — Telephone Encounter (Signed)
Paged regarding feeling weak and lightheaded starting today.   Of note, on 8/14, [name] reported orthopnea, ankle swelling, 1.9kg weight gain, to Dr. Stanford [name]. She was instructed to increase lasix from 80/40 to 80BID through today. Edema has improved but she still notes some dyspnea which she thinks is related to fatigue. She is feeling thirsty but has poor appetite. No chest pain, syncope, pre-syncope. She is still able to perform ADLs despite fatigue. Sleeping on 2.5 pillows, which it has been over the past few days. Has not checked weight recently. BP this evening is 120/76.  We discussed that she will need to be seen for this and that the etiology is not exactly clear as I would not expect new symptoms to develop from HF when she has evidence of improvement (decreased edema). Her husband is currently out of town but will return tonight. The plan we discussed is to call Dr. Jacalyn [name] office tomorrow AM to be seen and to get labs checked. If she feels worse, particularly if she is having difficult performing ADLs, she will need to come to the ED tonight.   Given her lightheadedness and weakness will hld off on PM dose of coreg and lasix.  All questions were answered. [name] is in agreement with the plan.

## 2014-01-08 ENCOUNTER — Encounter (HOSPITAL_COMMUNITY)
Admission: RE | Admit: 2014-01-08 | Discharge: 2014-01-08 | Disposition: A | Discharge status: Home / Self Care | Payer: 59 | Source: Ambulatory Visit | Attending: Cardiology | Admitting: Cardiology

## 2014-01-08 ENCOUNTER — Other Ambulatory Visit: Payer: Self-pay

## 2014-01-08 DIAGNOSIS — R0600 Dyspnea, unspecified: Secondary | ICD-10-CM

## 2014-01-08 DIAGNOSIS — I428 Other cardiomyopathies: Secondary | ICD-10-CM | POA: Diagnosis not present

## 2014-01-08 MED ORDER — CARVEDILOL 25 MG PO TABS: 25.0000 mg | ORAL_TABLET | Freq: Two times a day (BID) | ORAL | Status: DC

## 2014-01-10 ENCOUNTER — Telehealth: Payer: Self-pay | Admitting: Cardiology

## 2014-01-10 ENCOUNTER — Encounter (HOSPITAL_COMMUNITY)
Admission: RE | Admit: 2014-01-10 | Discharge: 2014-01-10 | Disposition: A | Discharge status: Home / Self Care | Payer: 59 | Source: Ambulatory Visit | Attending: Cardiology | Admitting: Cardiology

## 2014-01-10 LAB — BASIC METABOLIC PANEL WITH GFR
BUN: 13 mg/dL (ref 6–23)
CO2: 27 mEq/L (ref 19–32)
Calcium: 9.6 mg/dL (ref 8.4–10.5)
Chloride: 97 mEq/L (ref 96–112)
Creat: 1.03 mg/dL (ref 0.50–1.10)
GFR, Est African American: 79 mL/min
GFR, Est Non African American: 69 mL/min
Glucose, Bld: 103 mg/dL — ABNORMAL HIGH (ref 70–99)
Potassium: 3.9 mEq/L (ref 3.5–5.3)
Sodium: 139 mEq/L (ref 135–145)

## 2014-01-10 NOTE — Telephone Encounter (Signed)
[name] ,PATIENT WILL GET LAB DONE AT HOSPITAL

## 2014-01-10 NOTE — Progress Notes (Signed)
Mariyanna reports feeling fatigued this afternoon initially Chrisette had sinus with intermittently frequent PVC's. Heart rate initially 100. Mylani PVC's subsided after resting.  Blood pressure 110/80. Oxygen saturation 99% on room air. Lung fields clear upon ascultation. Tamaiya told me she called the on call physician on 01/05/2014. Laruen was supposed  To have a BMET drawn on 01/05/2014 when she called the on call doctor. Patient taken to the lab to get a BMET drawn. Elonna did not exercise today she has an appointment to follow up with Dr Stanford Breed tomorrow. Will fax exercise flow sheets to Dr. Jacalyn Lefevre office for review with today's EKG tracings.

## 2014-01-10 NOTE — Telephone Encounter (Signed)
New Prob    Pt is having PVCs at cardiac rehab today. Calling to see if labs should be drawn today. Please call.

## 2014-01-10 NOTE — Telephone Encounter (Signed)
Spoke to Od[place]ah.  patient informed [name]- fatigue today, b/p 110/70 , O2 sat 90% -with pvc's Per [name] , will not exercise patient today. Patient has not had labs drawn from last weeks conversation- okay for patient to go today -either [facility] or the [facility]

## 2014-01-11 ENCOUNTER — Encounter: Payer: Self-pay | Admitting: Cardiology

## 2014-01-11 ENCOUNTER — Ambulatory Visit (INDEPENDENT_AMBULATORY_CARE_PROVIDER_SITE_OTHER): Payer: 59 | Admitting: Cardiology

## 2014-01-11 VITALS — BP 136/84 | HR 96 | Ht 65.0 in | Wt 248.0 lb

## 2014-01-11 DIAGNOSIS — R0609 Other forms of dyspnea: Secondary | ICD-10-CM

## 2014-01-11 DIAGNOSIS — R0989 Other specified symptoms and signs involving the circulatory and respiratory systems: Secondary | ICD-10-CM

## 2014-01-11 DIAGNOSIS — O903 Peripartum cardiomyopathy: Secondary | ICD-10-CM

## 2014-01-11 DIAGNOSIS — I10 Essential (primary) hypertension: Secondary | ICD-10-CM

## 2014-01-11 DIAGNOSIS — R0600 Dyspnea, unspecified: Secondary | ICD-10-CM

## 2014-01-11 MED ORDER — FUROSEMIDE 40 MG PO TABS: 80.0000 mg | ORAL_TABLET | Freq: Two times a day (BID) | ORAL | Status: DC

## 2014-01-11 NOTE — Assessment & Plan Note (Signed)
Blood pressure controlled. Continue present medications. 

## 2014-01-11 NOTE — Progress Notes (Signed)
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 Echocardiogram in June 2015 showed an ejection fraction of 35-40% and restrictive filling; mild LAE and mild MR. Since last seen, She notes some dyspnea on exertion and orthopnea. No PND or pedal edema. No chest pain or syncope.  Current Outpatient Prescriptions  Medication Sig Dispense Refill  . Armodafinil (NUVIGIL) 250 MG tablet Take 250 mg by mouth daily.      . Aspirin-Acetaminophen-Caffeine (EXCEDRIN PO) Take 2 tablets by mouth every 6 (six) hours as needed (headache).      Marland Kitchen buPROPion (WELLBUTRIN XL) 150 MG 24 hr tablet Take 450 mg by mouth daily.      . carvedilol (COREG) 25 MG tablet Take 1 tablet (25 mg total) by mouth 2 (two) times daily with a meal.  180 tablet  1  . cyclobenzaprine (FLEXERIL) 10 MG tablet Take 1 tablet (10 mg total) by mouth 3 (three) times daily as needed for muscle spasms.  30 tablet  0  . dicyclomine (BENTYL) 10 MG capsule Take 10 mg by mouth 3 (three) times daily as needed for spasms.      Marland Kitchen FLUoxetine (PROZAC) 20 MG capsule Take 20 mg by mouth every morning.       . fluticasone (FLONASE) 50 MCG/ACT nasal spray Place 1 spray into both nostrils at bedtime. Before donning CPAP  16 g  2  . furosemide (LASIX) 40 MG tablet Take 80mg  in the morning and 40mg  in the evening for 5 days  60 tablet  0  . gabapentin (NEURONTIN) 100 MG capsule Take 1 capsule (100 mg total) by mouth 2 (two) times daily.  180 capsule  0  . gabapentin (NEURONTIN) 300 MG capsule Take 300 mg by mouth at bedtime. Daily at bedtime.      . hydrOXYzine (ATARAX/VISTARIL) 25 MG tablet Take 1 tablet (25 mg total) by mouth 3 (three) times daily as needed for anxiety.  30 tablet  0  . ibuprofen (ADVIL,MOTRIN) 600 MG tablet Take 1 tablet (600 mg total) by mouth every 6 (six) hours as needed.  30 tablet  0  . losartan (COZAAR) 50 MG tablet Take 1 tablet (50 mg total) by mouth daily.  90 tablet  3  .  pantoprazole (PROTONIX) 20 MG tablet Take 1 tablet (20 mg total) by mouth 2 (two) times daily.  60 tablet  6  . potassium chloride SA (K-DUR,KLOR-CON) 20 MEQ tablet Take 2 tablets (40 mEq total) by mouth 2 (two) times daily.  124 tablet  12  . Prenatal Multivit-Min-Fe-FA (PRENATAL 1 + IRON PO) Take 1 tablet by mouth.      . Skin Protectants, Misc. (EUCERIN) cream Apply 1 application topically as needed for dry skin.      Marland Kitchen spironolactone (ALDACTONE) 25 MG tablet Take 1 tablet (25 mg total) by mouth daily.  90 tablet  3  . sucralfate (CARAFATE) 1 G tablet Take 1 g by mouth daily.      [name] 625 MG tablet TAKE 1 TABLET BY MOUTH EVERY DAY  30 tablet  2   No current facility-administered medications for this visit.     Past Medical History  Diagnosis Date  . Hypertension   . Ovarian cyst     1999; surgically removed  . OSA (obstructive sleep apnea)     on CPAP   . IBS (irritable bowel syndrome)   . Postpartum cardiomyopathy     developed after  1st pregnancy  . Depression   . Anxiety   . Fibroid     age 33  . Termination of pregnancy     due to cardiac risk  . DVT (deep venous thrombosis)     2014  . CHF (congestive heart failure)   . Gallstones     Past Surgical History  Procedure Laterality Date  . Abdominal surgery      several  . Cesarean section    . Cholecystectomy    . Foot surgery Right   . Tubal ligation  1999    History   Social History  . Marital Status: Married    Spouse Name: N/A    Number of Children: 1  . Years of Education: N/A   Occupational History  . stay at home mom    Social History Main Topics  . Smoking status: Never Smoker   . Smokeless tobacco: Never Used  . Alcohol Use: No  . Drug Use: No  . Sexual Activity: Not on file   Other Topics Concern  . Not on file   Social History Narrative  . No narrative on file    ROS: no fevers or chills, productive cough, hemoptysis, dysphasia, odynophagia, melena, hematochezia, dysuria,  hematuria, rash, seizure activity, orthopnea, PND, pedal edema, claudication. Remaining systems are negative.  Physical Exam: Well-developed obese in no acute distress.  Skin is warm and dry.  HEENT is normal.  Neck is supple.  Chest is clear to auscultation with normal expansion.  Cardiovascular exam is regular rate and rhythm.  Abdominal exam nontender or distended. No masses palpated. Extremities show no edema. neuro grossly intact  ECG Sinus rhythm at a rate of 96. No ST changes.

## 2014-01-11 NOTE — Assessment & Plan Note (Signed)
Plan to continue present medications. She notes mild dyspnea on exertion and orthopnea. Increase Lasix to 80 mg twice a day. Check potassium and renal function in one week.

## 2014-01-11 NOTE — Patient Instructions (Signed)
Your physician wants you to follow-up in: [place] will receive a reminder letter in the mail two months in advance. If you don't receive a letter, please call our office to schedule the follow-up appointment.   INCREASE FUROSEMIDE TO 80 MG TWICE DAILY=2 40 MG TABLETS TWICE DAILY  Your physician recommends that you return for lab work in: [place]

## 2014-01-12 ENCOUNTER — Encounter (HOSPITAL_COMMUNITY)
Admission: RE | Admit: 2014-01-12 | Discharge: 2014-01-12 | Disposition: A | Discharge status: Home / Self Care | Payer: 59 | Source: Ambulatory Visit | Attending: Cardiology | Admitting: Cardiology

## 2014-01-12 DIAGNOSIS — I428 Other cardiomyopathies: Secondary | ICD-10-CM | POA: Diagnosis not present

## 2014-01-12 NOTE — Progress Notes (Signed)
Reviewed Levada Dy quality of life questionnaire with her. Monika has low scores in all areas except family.  Zeyna has been depressed but denies being suicidal. Chanique has an appointment with her psychiatrist on Monda morning. I faxed today's quality of life questionnaire for Dr Toy Care to review. Wade takes Wellbutrin and prozac for depression. Emotional support given to the patient. Will continue to monitor the patient throughout  the program.

## 2014-01-12 NOTE — Progress Notes (Signed)
Reviewed home exercise with pt today.  Pt plans to walk at home and go to water aerobics class for exercise.  Reviewed THR, pulse, RPE, sign and symptoms, and when to call 911 or MD.  Pt voiced understanding. Alberteen Sam, MA, ACSM RCEP

## 2014-01-15 ENCOUNTER — Encounter (HOSPITAL_COMMUNITY): Payer: 59

## 2014-01-17 ENCOUNTER — Encounter: Payer: Self-pay | Admitting: Internal Medicine

## 2014-01-17 ENCOUNTER — Encounter: Payer: Self-pay | Admitting: Family Medicine

## 2014-01-17 ENCOUNTER — Ambulatory Visit (INDEPENDENT_AMBULATORY_CARE_PROVIDER_SITE_OTHER): Payer: 59 | Admitting: Family Medicine

## 2014-01-17 ENCOUNTER — Encounter (HOSPITAL_COMMUNITY): Admission: RE | Admit: 2014-01-17 | Payer: 59 | Source: Ambulatory Visit

## 2014-01-17 ENCOUNTER — Telehealth (HOSPITAL_COMMUNITY): Payer: Self-pay | Admitting: *Deleted

## 2014-01-17 ENCOUNTER — Ambulatory Visit (INDEPENDENT_AMBULATORY_CARE_PROVIDER_SITE_OTHER): Payer: 59 | Admitting: Internal Medicine

## 2014-01-17 VITALS — BP 110/82 | HR 95 | Ht 65.0 in | Wt 248.0 lb

## 2014-01-17 VITALS — BP 122/82 | HR 93 | Temp 98.4°F | Wt 248.4 lb

## 2014-01-17 DIAGNOSIS — Z23 Encounter for immunization: Secondary | ICD-10-CM

## 2014-01-17 DIAGNOSIS — J018 Other acute sinusitis: Secondary | ICD-10-CM

## 2014-01-17 DIAGNOSIS — S8262XG Displaced fracture of lateral malleolus of left fibula, subsequent encounter for closed fracture with delayed healing: Secondary | ICD-10-CM

## 2014-01-17 DIAGNOSIS — S8290XD Unspecified fracture of unspecified lower leg, subsequent encounter for closed fracture with routine healing: Secondary | ICD-10-CM

## 2014-01-17 DIAGNOSIS — J019 Acute sinusitis, unspecified: Secondary | ICD-10-CM | POA: Insufficient documentation

## 2014-01-17 MED ORDER — AMOXICILLIN 500 MG PO CAPS: 1000.0000 mg | ORAL_CAPSULE | Freq: Two times a day (BID) | ORAL | Status: DC

## 2014-01-17 MED ORDER — VITAMIN D (ERGOCALCIFEROL) 1.25 MG (50000 UNIT) PO CAPS: 50000.0000 [IU] | ORAL_CAPSULE | ORAL | Status: DC

## 2014-01-17 NOTE — Progress Notes (Signed)
Subjective:    Patient ID: [name], female    DOB: 12-02-1975, 38 y.o.   MRN: [id number]  HPI  Here with 2-3 days acute onset fever, facial pain, pressure, headache, general weakness and malaise, and greenish d/c, with mild ST and cough, but pt denies chest pain, wheezing, increased sob or doe, orthopnea, PND, increased LE swelling, palpitations, dizziness or syncope.  Due for flu shot.  Saw ortho this am in f/u for may 2015 left ankle fx , still with pain, now for hard ankle support brace. Past Medical History  Diagnosis Date  . Hypertension   . Ovarian cyst     1999; surgically removed  . OSA (obstructive sleep apnea)     on CPAP   . IBS (irritable bowel syndrome)   . 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 Depression   . Anxiety   . Fibroid     age 49  . Termination of pregnancy     due to cardiac risk  . DVT (deep venous thrombosis)     2014  . CHF (congestive heart failure)   . Gallstones    Past Surgical History  Procedure Laterality Date  . Abdominal surgery      several  . Cesarean section    . Cholecystectomy    . Foot surgery Right   . Tubal ligation  1999    reports that she has never smoked. She has never used smokeless tobacco. She reports that she does not drink alcohol or use illicit drugs. family history includes Allergies in her daughter; Emphysema in her maternal grandmother; Heart disease (age of onset: 4) in her maternal grandmother; Rheum arthritis in her mother. Allergies  Allergen Reactions  . Ivp Dye [Iodinated Diagnostic Agents] Other (See Comments)    Did something to my liver and kidneys told not to use it  . Vancomycin Other (See Comments)    "did something to my kidneys"; went into kidney failure  . Aspirin Other (See Comments)    Wheezing; but patient still takes if she needs to  . Ciprofloxacin Itching and Rash  . Sulfa Antibiotics Itching and Rash   Current Outpatient Prescriptions on File Prior to Visit    Medication Sig Dispense Refill  . Armodafinil (NUVIGIL) 250 MG tablet Take 250 mg by mouth daily.      . Aspirin-Acetaminophen-Caffeine (EXCEDRIN PO) Take 2 tablets by mouth every 6 (six) hours as needed (headache).      Marland Kitchen buPROPion (WELLBUTRIN XL) 150 MG 24 hr tablet Take 450 mg by mouth daily.      . carvedilol (COREG) 25 MG tablet Take 1 tablet (25 mg total) by mouth 2 (two) times daily with a meal.  180 tablet  1  . cyclobenzaprine (FLEXERIL) 10 MG tablet Take 1 tablet (10 mg total) by mouth 3 (three) times daily as needed for muscle spasms.  30 tablet  0  . dicyclomine (BENTYL) 10 MG capsule Take 10 mg by mouth 3 (three) times daily as needed for spasms.      . fluticasone (FLONASE) 50 MCG/ACT nasal spray Place 1 spray into both nostrils at bedtime. Before donning CPAP  16 g  2  . furosemide (LASIX) 40 MG tablet Take 2 tablets (80 mg total) by mouth 2 (two) times daily.  120 tablet  12  . gabapentin (NEURONTIN) 100 MG capsule Take 1 capsule (100 mg total) by mouth 2 (two) times daily.  180 capsule  0  . gabapentin (NEURONTIN) 300 MG capsule Take 300 mg by mouth at bedtime. Daily at bedtime.      . hydrOXYzine (ATARAX/VISTARIL) 25 MG tablet Take 1 tablet (25 mg total) by mouth 3 (three) times daily as needed for anxiety.  30 tablet  0  . ibuprofen (ADVIL,MOTRIN) 600 MG tablet Take 1 tablet (600 mg total) by mouth every 6 (six) hours as needed.  30 tablet  0  . losartan (COZAAR) 50 MG tablet Take 1 tablet (50 mg total) by mouth daily.  90 tablet  3  . pantoprazole (PROTONIX) 20 MG tablet Take 1 tablet (20 mg total) by mouth 2 (two) times daily.  60 tablet  6  . potassium chloride SA (K-DUR,KLOR-CON) 20 MEQ tablet Take 2 tablets (40 mEq total) by mouth 2 (two) times daily.  124 tablet  12  . Prenatal Multivit-Min-Fe-FA (PRENATAL 1 + IRON PO) Take 1 tablet by mouth.      . Skin Protectants, Misc. (EUCERIN) cream Apply 1 application topically as needed for dry skin.      Marland Kitchen spironolactone  (ALDACTONE) 25 MG tablet Take 1 tablet (25 mg total) by mouth daily.  90 tablet  3  . sucralfate (CARAFATE) 1 G tablet Take 1 g by mouth daily.      [name] 625 MG tablet TAKE 1 TABLET BY MOUTH EVERY DAY  30 tablet  2   No current facility-administered medications on file prior to visit.   Review of Systems All otherwise neg per pt     Objective:   Physical Exam BP 122/82  Pulse 93  Temp(Src) 98.4 F (36.9 C) (Oral)  Wt 248 lb 6 oz (112.662 kg)  SpO2 95%  LMP 12/08/2013 VS noted, mild ill Constitutional: Pt appears well-developed, well-nourished.  HENT: Head: NCAT.  Right Ear: External ear normal.  Left Ear: External ear normal.  Bilat tm's with mild erythema.  Max sinus areas mild tender right > left.  Pharynx with mild erythema, no exudate Eyes: . Pupils are equal, round, and reactive to light. Conjunctivae and EOM are normal Neck: Normal range of motion. Neck supple.  Cardiovascular: Normal rate and regular rhythm.   Pulmonary/Chest: Effort normal and breath sounds normal.  Neurological: Pt is alert. Not confused , motor grossly intact Skin: Skin is warm. No rash Psychiatric: Pt behavior is normal. No agitation.     Assessment & Plan:

## 2014-01-17 NOTE — Progress Notes (Signed)
  [name] [place], [place] [address] Phone: [phone] Subjective:     CC: Left ankle fracture followup  [id number] [name] is a 38 y.o. female coming in with complaint of left ankle pain. Patient did have a small avulsion fracture noted on the lateral malleolus. Patient has not been wearing a brace areolar basis. Patient states that she still has some soreness on that side. Patient is able to inability. Patient has been doing icing but has not started with the vitamin D supplementation. Patient has had increasing her Lasix recently do to lower trace swelling. Denies any numbness denies any loss of strength.     Past medical history, social, surgical and family history all reviewed in electronic medical record.   Review of Systems: No headache, visual changes, nausea, vomiting, diarrhea, constipation, dizziness, abdominal pain, skin rash, fevers, chills, night sweats, weight loss, swollen lymph nodes, body aches, joint swelling, muscle aches, chest pain, shortness of breath, mood changes.   Objective Blood pressure 110/82, pulse 95, height 5\' 5"  (1.651 m), weight 248 lb (112.492 kg), last menstrual period 12/08/2013, SpO2 95.00%.  General: No apparent distress alert and oriented x3 mood and affect normal, dressed appropriately.  HEENT: Pupils equal, extraocular movements intact  Respiratory: [name] speak in full sentences and does not appear short of breath  Cardiovascular: No lower extremity edema, non tender, no erythema  Skin: Warm dry intact with no signs of infection or rash on extremities or on axial skeleton.  Abdomen: Soft nontender  Neuro: Cranial nerves II through XII are intact, neurovascularly intact in all extremities with 2+ DTRs and 2+ pulses.  Lymph: No lymphadenopathy of posterior or anterior cervical chain or axillae bilaterally.  Gait normal with good balance and coordination.  MSK:  Non tender with full range  of motion and good stability and symmetric strength and tone of shoulders, elbows, wrist, hip, knee and bilaterally.  Ankle: Left Minimal swelling over the lateral malleolus Range of motion is full in all directions. Strength is 5/5 in all directions. Stable lateral and medial ligaments; squeeze test and kleiger test unremarkable; Talar dome nontender; No pain at base of 5th MT; No tenderness over cuboid; No tenderness over N spot or navicular prominence No sign of peroneal tendon subluxations or tenderness to palpation Negative tarsal tunnel tinel's Able to walk 4 steps. Negative anterior drawer  MSK US performed of: Left ankle This study was ordered, performed, and interpreted by Charlann Boxer D.O.  Foot/Ankle:   All structures visualized.   Talar dome unremarkable  Ankle mortise without effusion. Peroneus longus and brevis tendons unremarkable on long and transverse views without sheath effusions. Posterior tibialis, flexor hallucis longus, and flexor digitorum longus tendons unremarkable on long and transverse views without sheath effusions. Achilles tendon visualized along length of tendon and unremarkable on long and transverse views without sheath effusion. Anterior Talofibular Ligament and Calcaneofibular Ligaments unremarkable and intact. The patient does have increased callus formation but still not complete healing over the lateral malleolus avulsion.. Deltoid Ligament unremarkable and intact. Plantar fascia intact and without effusion, normal thickness. No increased doppler signal, cap sign, or thickening of tibial cortex. Power doppler signal normal.  IMPRESSION: Near healed lateral malleolus fracture       Impression and Recommendations:     This case required medical decision making of moderate complexity.

## 2014-01-17 NOTE — Assessment & Plan Note (Signed)
Patient does have a lateral malleolus fracture. Patient is healing very well but continues to be a little bit slower than usual likely secondary to her other comorbidities. We'll increase patient vitamin D supplementation with once weekly. We discussed about calcium supplementation especially with patient being on increasing doses of Lasix. Patient was given a new air brace I think will be beneficial. Patient showing proper way to wear this. Patient and will try these interventions and come back and see me again in 3 weeks to make sure completely healed. I would consider patient progressing with delayed healing.  Spent greater than 25 minutes with patient face-to-face and had greater than 50% of counseling including as described above in assessment and plan.

## 2014-01-17 NOTE — Patient Instructions (Signed)
You had the flu shot today  Please take all new medication as prescribed - the antibiotic - sent to [facility] can also take Delsym OTC for cough, and/or Mucinex (or it's generic off brand) for congestion, and tylenol or advil as needed for pain.  Please continue all other medications as before, and refills have been done if requested.  Please have the pharmacy call with any other refills you may need.  Please keep your appointments with your specialists as you may have planned

## 2014-01-17 NOTE — Progress Notes (Signed)
Pre visit review using our clinic review tool, if applicable. No additional management support is needed unless otherwise documented below in the visit note. 

## 2014-01-17 NOTE — Assessment & Plan Note (Signed)
Mild to mod, for antibx course,  to f/u any worsening symptoms or concerns, also for mucinex prn congestion, and otc ibuprofen for pain

## 2014-01-17 NOTE — Patient Instructions (Addendum)
I am sorry you are not healed.  Vitamin D 50000 a week.  Try calcium chew as well.  New brace with walking and ice at end of the day.  New exercises on the CD.  Continue with rehab.  We will see you again in 3 weeks.

## 2014-01-17 NOTE — Addendum Note (Signed)
Addended by: Sharon Seller B on: 01/17/2014 10:45 AM   Modules accepted: Orders

## 2014-01-18 ENCOUNTER — Ambulatory Visit: Payer: 59 | Admitting: Cardiology

## 2014-01-19 ENCOUNTER — Other Ambulatory Visit: Payer: Self-pay | Admitting: Internal Medicine

## 2014-01-19 ENCOUNTER — Encounter (HOSPITAL_COMMUNITY): Payer: 59

## 2014-01-19 ENCOUNTER — Encounter: Payer: Self-pay | Admitting: Internal Medicine

## 2014-01-19 LAB — BASIC METABOLIC PANEL WITH GFR
BUN: 12 mg/dL (ref 6–23)
CO2: 28 mEq/L (ref 19–32)
Calcium: 9.7 mg/dL (ref 8.4–10.5)
Chloride: 100 mEq/L (ref 96–112)
Creat: 0.78 mg/dL (ref 0.50–1.10)
GFR, Est African American: >89 mL/min
GFR, Est Non African American: >89 mL/min
Glucose, Bld: 94 mg/dL (ref 70–99)
Potassium: 3.9 mEq/L (ref 3.5–5.3)
Sodium: 138 mEq/L (ref 135–145)

## 2014-01-19 MED ORDER — DOXYCYCLINE HYCLATE 100 MG PO TABS: 100.0000 mg | ORAL_TABLET | Freq: Two times a day (BID) | ORAL | Status: DC

## 2014-01-22 ENCOUNTER — Encounter (HOSPITAL_COMMUNITY): Payer: 59

## 2014-01-23 ENCOUNTER — Encounter: Payer: Self-pay | Admitting: Cardiology

## 2014-01-24 ENCOUNTER — Encounter (HOSPITAL_COMMUNITY): Payer: 59

## 2014-01-26 ENCOUNTER — Encounter (HOSPITAL_COMMUNITY)
Admission: RE | Admit: 2014-01-26 | Discharge: 2014-01-26 | Disposition: A | Discharge status: Home / Self Care | Payer: 59 | Source: Ambulatory Visit | Attending: Cardiology | Admitting: Cardiology

## 2014-01-26 DIAGNOSIS — I10 Essential (primary) hypertension: Secondary | ICD-10-CM | POA: Insufficient documentation

## 2014-01-26 DIAGNOSIS — I509 Heart failure, unspecified: Secondary | ICD-10-CM | POA: Insufficient documentation

## 2014-01-26 DIAGNOSIS — Z5189 Encounter for other specified aftercare: Secondary | ICD-10-CM | POA: Insufficient documentation

## 2014-01-26 DIAGNOSIS — I428 Other cardiomyopathies: Secondary | ICD-10-CM | POA: Insufficient documentation

## 2014-01-26 DIAGNOSIS — E669 Obesity, unspecified: Secondary | ICD-10-CM | POA: Insufficient documentation

## 2014-01-26 NOTE — Progress Notes (Signed)
[name] returned to exercise today and exercised without difficulty. Weight down today vital signs stable.

## 2014-01-31 ENCOUNTER — Encounter (HOSPITAL_COMMUNITY): Payer: 59

## 2014-02-02 ENCOUNTER — Telehealth (HOSPITAL_COMMUNITY): Payer: Self-pay | Admitting: Internal Medicine

## 2014-02-02 ENCOUNTER — Encounter (HOSPITAL_COMMUNITY): Payer: 59

## 2014-02-02 ENCOUNTER — Telehealth: Payer: Self-pay | Admitting: Internal Medicine

## 2014-02-02 NOTE — Telephone Encounter (Signed)
Patient Information:  Caller Name: Kinjal  Phone: [phone]  Patient: [name], [name]  Gender: Female  DOB: Feb 02, 1976  Age: 38 Years  PCP: [name] (Adults only)  Pregnant: No  Office Follow Up:  Does the office need to follow up with this patient?: Yes  Instructions For The Office: Patient currently on new medication Viibyrd with a noted side effect of Diarrhea.  Just completed Doxycycline 10 day course for URI. Please review and advise.  RN Note:  Patient currently on new medication Viibyrd with a noted side effect of Diarrhea.  Just completed Doxycycline 10 day course for URI. Please review and advise.  Symptoms  Reason For Call & Symptoms: Patient is calling in with the complaints of Diarrhea.. Onset Tuesday 01/30/14. She treated herself with Pepto-Bismol.  01/31/14- she had decreased appeptite and took Pepto again . It will slow the diarrhea down but it continues.  Stools x 3 today-liquid brown/green.  Afebrile. No vomiting. Abdominal cramping prior to stool. No urinary discomfort . No vaginal discharge.  New medication Viibryd started on Sunday 01/28/14.  Recently completed /Doxycycline for URI .  Reviewed Health History In EMR: Yes  Reviewed Medications In EMR: Yes  Reviewed Allergies In EMR: Yes  Reviewed Surgeries / Procedures: Yes  Date of Onset of Symptoms: 01/30/2014  Treatments Tried: Pepto Bismol  Treatments Tried Worked: No OB / GYN:  LMP: 01/11/2014  Guideline(s) Used:  Diarrhea  Disposition Per Guideline:   Callback by PCP Today  Reason For Disposition Reached:   Recent antibiotic therapy (i.e., within last 2 months)  Advice Given:  Reassurance:  In healthy adults, new-onset diarrhea is usually caused by a viral infection of the intestines, which you can treat at home. Diarrhea is the body's way of getting rid of the infection. Here are some tips on how to keep ahead of the fluid losses.  Here is some care advice that should help.  Fluids:  Drink  more fluids, at least 8-10 glasses (8 oz or 240 ml) daily.  For example: sports drinks, diluted fruit juices, soft drinks.  Supplement this with saltine crackers or soups to make certain that you are getting sufficient fluid and salt to meet your body's needs.  Avoid caffeinated beverages (Reason: caffeine is mildly dehydrating).  Nutrition:  Maintaining some food intake during episodes of diarrhea is important.  Ideal initial foods include boiled starches/cereals (e.g., potatoes, rice, noodles, wheat, oats) with a small amount of salt to taste.  Other acceptable foods include: bananas, yogurt, crackers, soup.  As your stools return to normal consistency, resume a normal diet.  Diarrhea Medication - Bismuth Subsalicylate (e.g., Kaopectate, PeptoBismol):  Helps reduce diarrhea, vomiting, and abdominal cramping.  Adult dosage: 2 tablets or 2 tablespoons (30 ml) by mouth every hour if diarrhea continues to a maximum of 8 doses in a 24-hour period.  Do not use for more than 2 days  This medication can make the stools look dark or even black (but not red or tarry).  Call Back If:  Signs of dehydration occur (e.g., no urine for more than 12 hours, very dry mouth, lightheaded, etc.)  You become worse.  RN Overrode Recommendation:  Document Patient  Patient currently on new medication Viibyrd with a noted side effect of Diarrhea.  Just completed Doxycycline 10 day course for URI. Please review and advise.

## 2014-02-05 ENCOUNTER — Encounter (HOSPITAL_COMMUNITY)
Admission: RE | Admit: 2014-02-05 | Discharge: 2014-02-05 | Disposition: A | Discharge status: Home / Self Care | Payer: 59 | Source: Ambulatory Visit | Attending: Cardiology | Admitting: Cardiology

## 2014-02-05 DIAGNOSIS — Z5189 Encounter for other specified aftercare: Secondary | ICD-10-CM | POA: Diagnosis not present

## 2014-02-07 ENCOUNTER — Encounter (HOSPITAL_COMMUNITY)
Admission: RE | Admit: 2014-02-07 | Discharge: 2014-02-07 | Disposition: A | Discharge status: Home / Self Care | Payer: 59 | Source: Ambulatory Visit | Attending: Cardiology | Admitting: Cardiology

## 2014-02-07 DIAGNOSIS — Z5189 Encounter for other specified aftercare: Secondary | ICD-10-CM | POA: Diagnosis not present

## 2014-02-07 NOTE — Progress Notes (Addendum)
[name] will change her exercise schedule to two days a week until the first of November so she can transition to exercising at home and water aerobics. This agreement was made with the patient to help promote better compliance with attendance and regular exercise at cardiac rehab.

## 2014-02-09 ENCOUNTER — Other Ambulatory Visit: Payer: Self-pay

## 2014-02-09 ENCOUNTER — Encounter (HOSPITAL_COMMUNITY): Payer: 59

## 2014-02-09 MED ORDER — COLESEVELAM HCL 625 MG PO TABS: ORAL_TABLET | ORAL | Status: DC

## 2014-02-12 ENCOUNTER — Encounter (HOSPITAL_COMMUNITY)
Admission: RE | Admit: 2014-02-12 | Discharge: 2014-02-12 | Disposition: A | Discharge status: Home / Self Care | Payer: 59 | Source: Ambulatory Visit | Attending: Cardiology | Admitting: Cardiology

## 2014-02-12 DIAGNOSIS — Z5189 Encounter for other specified aftercare: Secondary | ICD-10-CM | POA: Diagnosis not present

## 2014-02-14 ENCOUNTER — Encounter (HOSPITAL_COMMUNITY)
Admission: RE | Admit: 2014-02-14 | Discharge: 2014-02-14 | Disposition: A | Discharge status: Home / Self Care | Payer: 59 | Source: Ambulatory Visit | Attending: Cardiology | Admitting: Cardiology

## 2014-02-14 DIAGNOSIS — Z5189 Encounter for other specified aftercare: Secondary | ICD-10-CM | POA: Diagnosis not present

## 2014-02-14 NOTE — Progress Notes (Signed)
[name] 38 y.o. female Nutrition Note Spoke with pt. Nutrition Plan and Nutrition Survey goals reviewed with pt. Pt is following Step 1  of the Therapeutic Lifestyle Changes diet. Per discussion, pt eats a significant amount of empty calories from sugar due to pt trying to avoid sodium. Pt has decreased sugar consumed from beverages, which is an improvement since pt started rehab. Ways to increase flavor and avoid sodium discussed. Pt is already eating mostly unprocessed foods and is using herbs/salt-free seasonings and spices (e.g. Cayenne pepper) to add flavor. Pt states her fluid restriction is now 1 L/d. Pt wants to lose wt.  Wt loss tips reviewed.  Pt expressed understanding of the information reviewed. Pt aware of nutrition education classes offered.  Nutrition Diagnosis   Food-and nutrition-related knowledge deficit related to lack of exposure to information as related to diagnosis of: ? CVD    Obesity related to excessive energy intake as evidenced by a BMI of 41.3  Nutrition RX/ Estimated Daily Nutrition Needs for: wt loss  1600-2100 Kcal, 40-55 gm fat, 10-16 gm sat fat, 1.5-2.1 gm trans-fat, <1500 mg sodium  Nutrition Intervention   Pt's individual nutrition plan reviewed with pt.   Benefits of adopting Therapeutic Lifestyle Changes discussed when Medficts reviewed.   Pt to attend the Portion Distortion class   Pt to attend the  ? Nutrition I class                     ? Nutrition II class   Pt given handouts for: ? Nutrition I class ? Nutrition II class   Continue client-centered nutrition education by RD, as part of interdisciplinary care. Goal(s)   Pt to identify and limit food sources of saturated fat, trans fat, and cholesterol   Pt to identify food quantities necessary to achieve: ? wt loss to a goal wt of 228-246 lb (103.5-111.7 kg) at graduation from cardiac rehab.  Monitor and Evaluate progress toward nutrition goal with team. Nutrition Risk: Change to Moderate Derek Mound, M.Ed, RD, LDN, CDE 02/14/2014 4:08 PM

## 2014-02-15 ENCOUNTER — Other Ambulatory Visit: Payer: Self-pay | Admitting: Internal Medicine

## 2014-02-16 ENCOUNTER — Encounter (HOSPITAL_COMMUNITY): Payer: 59

## 2014-02-16 ENCOUNTER — Telehealth: Payer: Self-pay

## 2014-02-16 NOTE — Telephone Encounter (Signed)
Recvd fax for CPAP supplies to include mask, head gear, tubing, filters, cushions, water chamber and/or pillows.   Called to verify the size of the items, customer service at [facility] advised that all they needed was a written rx with dx code for the supplies and they will be able to do the rest.   Have placed rx paper and attached [facility] fax and given to MD to sign.   Will fax back on or before Monday.

## 2014-02-18 ENCOUNTER — Encounter: Payer: Self-pay | Admitting: Family Medicine

## 2014-02-19 ENCOUNTER — Emergency Department (HOSPITAL_COMMUNITY): Payer: 59

## 2014-02-19 ENCOUNTER — Inpatient Hospital Stay (HOSPITAL_COMMUNITY)
Admission: EM | Admit: 2014-02-19 | Discharge: 2014-02-26 | DRG: 286 | Disposition: A | Discharge status: Home / Self Care | Payer: 59 | Attending: Internal Medicine | Admitting: Internal Medicine

## 2014-02-19 ENCOUNTER — Telehealth: Payer: Self-pay | Admitting: Physician Assistant

## 2014-02-19 ENCOUNTER — Encounter (HOSPITAL_COMMUNITY): Payer: Self-pay | Admitting: Emergency Medicine

## 2014-02-19 ENCOUNTER — Ambulatory Visit: Payer: 59 | Admitting: Family Medicine

## 2014-02-19 ENCOUNTER — Encounter (HOSPITAL_COMMUNITY)
Admission: RE | Admit: 2014-02-19 | Discharge: 2014-02-19 | Disposition: A | Discharge status: Home / Self Care | Payer: 59 | Source: Ambulatory Visit | Attending: Cardiology | Admitting: Cardiology

## 2014-02-19 DIAGNOSIS — Z86718 Personal history of other venous thrombosis and embolism: Secondary | ICD-10-CM

## 2014-02-19 DIAGNOSIS — E875 Hyperkalemia: Secondary | ICD-10-CM | POA: Diagnosis not present

## 2014-02-19 DIAGNOSIS — I5022 Chronic systolic (congestive) heart failure: Secondary | ICD-10-CM | POA: Diagnosis present

## 2014-02-19 DIAGNOSIS — I4891 Unspecified atrial fibrillation: Secondary | ICD-10-CM | POA: Diagnosis not present

## 2014-02-19 DIAGNOSIS — R059 Cough, unspecified: Secondary | ICD-10-CM

## 2014-02-19 DIAGNOSIS — O903 Peripartum cardiomyopathy: Secondary | ICD-10-CM

## 2014-02-19 DIAGNOSIS — R1011 Right upper quadrant pain: Secondary | ICD-10-CM

## 2014-02-19 DIAGNOSIS — I959 Hypotension, unspecified: Secondary | ICD-10-CM | POA: Diagnosis not present

## 2014-02-19 DIAGNOSIS — I10 Essential (primary) hypertension: Secondary | ICD-10-CM

## 2014-02-19 DIAGNOSIS — M999 Biomechanical lesion, unspecified: Secondary | ICD-10-CM

## 2014-02-19 DIAGNOSIS — G4700 Insomnia, unspecified: Secondary | ICD-10-CM | POA: Diagnosis present

## 2014-02-19 DIAGNOSIS — R200 Anesthesia of skin: Secondary | ICD-10-CM

## 2014-02-19 DIAGNOSIS — Z7982 Long term (current) use of aspirin: Secondary | ICD-10-CM

## 2014-02-19 DIAGNOSIS — Z91041 Radiographic dye allergy status: Secondary | ICD-10-CM

## 2014-02-19 DIAGNOSIS — G4733 Obstructive sleep apnea (adult) (pediatric): Secondary | ICD-10-CM

## 2014-02-19 DIAGNOSIS — R0789 Other chest pain: Secondary | ICD-10-CM | POA: Diagnosis not present

## 2014-02-19 DIAGNOSIS — R0600 Dyspnea, unspecified: Secondary | ICD-10-CM

## 2014-02-19 DIAGNOSIS — R079 Chest pain, unspecified: Secondary | ICD-10-CM

## 2014-02-19 DIAGNOSIS — Z6841 Body Mass Index (BMI) 40.0 and over, adult: Secondary | ICD-10-CM

## 2014-02-19 DIAGNOSIS — R9439 Abnormal result of other cardiovascular function study: Secondary | ICD-10-CM

## 2014-02-19 DIAGNOSIS — Z8249 Family history of ischemic heart disease and other diseases of the circulatory system: Secondary | ICD-10-CM

## 2014-02-19 DIAGNOSIS — N938 Other specified abnormal uterine and vaginal bleeding: Secondary | ICD-10-CM

## 2014-02-19 DIAGNOSIS — M7989 Other specified soft tissue disorders: Secondary | ICD-10-CM

## 2014-02-19 DIAGNOSIS — F418 Other specified anxiety disorders: Secondary | ICD-10-CM

## 2014-02-19 DIAGNOSIS — J189 Pneumonia, unspecified organism: Secondary | ICD-10-CM

## 2014-02-19 DIAGNOSIS — R05 Cough: Secondary | ICD-10-CM

## 2014-02-19 DIAGNOSIS — K589 Irritable bowel syndrome without diarrhea: Secondary | ICD-10-CM

## 2014-02-19 LAB — I-STAT TROPONIN, ED
Troponin i, poc: 0 ng/mL (ref 0.00–0.08)
Troponin i, poc: 0 ng/mL (ref 0.00–0.08)

## 2014-02-19 LAB — HEPATIC FUNCTION PANEL
ALT: 12 U/L (ref 0–35)
AST: 17 U/L (ref 0–37)
Albumin: 3.9 g/dL (ref 3.5–5.2)
Alkaline Phosphatase: 110 U/L (ref 39–117)
Bilirubin, Direct: <0.2 mg/dL (ref 0.0–0.3)
Total Bilirubin: 0.4 mg/dL (ref 0.3–1.2)
Total Protein: 7.5 g/dL (ref 6.0–8.3)

## 2014-02-19 LAB — BASIC METABOLIC PANEL
Anion gap: 11 (ref 5–15)
BUN: 7 mg/dL (ref 6–23)
CO2: 27 mEq/L (ref 19–32)
Calcium: 9.4 mg/dL (ref 8.4–10.5)
Chloride: 100 mEq/L (ref 96–112)
Creatinine, Ser: 0.75 mg/dL (ref 0.50–1.10)
GFR calc Af Amer: >90 mL/min (ref >90)
GFR calc non Af Amer: >90 mL/min (ref >90)
Glucose, Bld: 88 mg/dL (ref 70–99)
Potassium: 4 mEq/L (ref 3.7–5.3)
Sodium: 138 mEq/L (ref 137–147)

## 2014-02-19 LAB — CBC WITH DIFFERENTIAL/PLATELET
Basophils Absolute: 0 10*3/uL (ref 0.0–0.1)
Basophils Relative: 0 % (ref 0–1)
Eosinophils Absolute: 0.1 10*3/uL (ref 0.0–0.7)
Eosinophils Relative: 1 % (ref 0–5)
HCT: 39.6 % (ref 36.0–46.0)
Hemoglobin: 13 g/dL (ref 12.0–15.0)
Lymphocytes Relative: 33 % (ref 12–46)
Lymphs Abs: 2.4 10*3/uL (ref 0.7–4.0)
MCH: 28.6 pg (ref 26.0–34.0)
MCHC: 32.8 g/dL (ref 30.0–36.0)
MCV: 87 fL (ref 78.0–100.0)
Monocytes Absolute: 0.4 10*3/uL (ref 0.1–1.0)
Monocytes Relative: 6 % (ref 3–12)
Neutro Abs: 4.5 10*3/uL (ref 1.7–7.7)
Neutrophils Relative %: 60 % (ref 43–77)
Platelets: 287 10*3/uL (ref 150–400)
RBC: 4.55 MIL/uL (ref 3.87–5.11)
RDW: 13.7 % (ref 11.5–15.5)
WBC: 7.5 10*3/uL (ref 4.0–10.5)

## 2014-02-19 LAB — LIPASE, BLOOD: Lipase: 21 U/L (ref 11–59)

## 2014-02-19 LAB — D-DIMER, QUANTITATIVE: D-Dimer, Quant: 0.83 ug/mL-FEU — ABNORMAL HIGH (ref 0.00–0.48)

## 2014-02-19 LAB — PRO B NATRIURETIC PEPTIDE: Pro B Natriuretic peptide (BNP): 947.6 pg/mL — ABNORMAL HIGH (ref 0–125)

## 2014-02-19 MED ORDER — GI COCKTAIL ~~LOC~~
30.0000 mL | Freq: Once | ORAL | Status: AC
  Administered 2014-02-19: 30 mL via ORAL
  Filled 2014-02-19: qty 30

## 2014-02-19 MED ORDER — MORPHINE SULFATE 4 MG/ML IJ SOLN
4.0000 mg | Freq: Once | INTRAMUSCULAR | Status: AC
  Administered 2014-02-19: 4 mg via INTRAVENOUS
  Filled 2014-02-19: qty 1

## 2014-02-19 MED ORDER — TECHNETIUM TC 99M DIETHYLENETRIAME-PENTAACETIC ACID: 40.0000 | Freq: Once | INTRAVENOUS | Status: AC | PRN

## 2014-02-19 MED ORDER — ASPIRIN 325 MG PO TABS
325.0000 mg | ORAL_TABLET | Freq: Once | ORAL | Status: AC
  Administered 2014-02-19: 325 mg via ORAL
  Filled 2014-02-19: qty 1

## 2014-02-19 MED ORDER — TECHNETIUM TO 99M ALBUMIN AGGREGATED
6.0000 | Freq: Once | INTRAVENOUS | Status: AC | PRN
  Administered 2014-02-19: 6 via INTRAVENOUS

## 2014-02-19 NOTE — ED Provider Notes (Signed)
CSN: [id number]     Arrival date & time 02/19/14  1602 History   First MD Initiated Contact with Patient 02/19/14 1637     Chief Complaint  Patient presents with  . Chest Pain     (Consider location/radiation/quality/duration/timing/severity/associated sxs/prior Treatment) Patient is a 38 y.o. female presenting with chest pain. The history is provided by the patient. No language interpreter was used.  Chest Pain Pain location:  Epigastric and substernal area Pain quality comment:  Squeezing Pain radiates to:  Does not radiate Pain radiates to the back: no   Pain severity:  Moderate Onset quality:  Sudden Duration:  10 hours Timing:  Constant Progression:  Waxing and waning Chronicity:  New Context: at rest   Relieved by: better sitting up. Worsened by:  Exertion (worse during cardiac rehab) Ineffective treatments:  None tried Associated symptoms: diaphoresis, fatigue and nausea   Associated symptoms: no abdominal pain, no back pain, no cough, no fever, no headache, no numbness, no palpitations, no shortness of breath, not vomiting and no weakness   Risk factors: hypertension, obesity and prior DVT/PE   Risk factors: not female     Past Medical History  Diagnosis Date  . Hypertension   . Ovarian cyst     1999; surgically removed  . OSA (obstructive sleep apnea)     on CPAP   . IBS (irritable bowel syndrome)   . 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 Depression   . Anxiety   . Fibroid     age 78  . Termination of pregnancy     due to cardiac risk  . DVT (deep venous thrombosis)     2014  . CHF (congestive heart failure)   . Gallstones    Past Surgical History  Procedure Laterality Date  . Abdominal surgery      several  . Cesarean section    . Cholecystectomy    . Foot surgery Right   . Tubal ligation  1999   Family History  Problem Relation Age of Onset  . Emphysema Maternal Grandmother     smoked  . Allergies Daughter   . Heart  disease Maternal Grandmother 50    MI  . Rheum arthritis Mother    History  Substance Use Topics  . Smoking status: Never Smoker   . Smokeless tobacco: Never Used  . Alcohol Use: No   OB History   Grav Para Term Preterm Abortions TAB SAB Ect Mult Living   2 1   1 1    1      Review of Systems  Constitutional: Positive for diaphoresis and fatigue. Negative for fever, chills, activity change and appetite change.  HENT: Negative for congestion, facial swelling, rhinorrhea and sore throat.   Eyes: Negative for photophobia and discharge.  Respiratory: Negative for cough, chest tightness and shortness of breath.   Cardiovascular: Positive for chest pain. Negative for palpitations and leg swelling.  Gastrointestinal: Positive for nausea. Negative for vomiting, abdominal pain and diarrhea.  Endocrine: Negative for polydipsia and polyuria.  Genitourinary: Negative for dysuria, frequency, difficulty urinating and pelvic pain.  Musculoskeletal: Negative for arthralgias, back pain, neck pain and neck stiffness.  Skin: Negative for color change and wound.  Allergic/Immunologic: Negative for immunocompromised state.  Neurological: Negative for facial asymmetry, weakness, numbness and headaches.  Hematological: Does not bruise/bleed easily.  Psychiatric/Behavioral: Negative for confusion and agitation.      Allergies  Ivp dye; Vancomycin; Aspirin; Ciprofloxacin; and Sulfa  antibiotics  Home Medications   Prior to Admission medications   Medication Sig Start Date End Date Taking? Authorizing Provider  Armodafinil (NUVIGIL) 250 MG tablet Take 250 mg by mouth daily.   Yes Historical Provider, MD  Aspirin-Acetaminophen-Caffeine (EXCEDRIN PO) Take 2 tablets by mouth every 6 (six) hours as needed (headache).   Yes Historical Provider, MD  carvedilol (COREG) 25 MG tablet Take 1 tablet (25 mg total) by mouth 2 (two) times daily with a meal. 01/08/14  Yes Lelon Perla, MD  colesevelam (WELCHOL)  625 MG tablet TAKE 1 TABLET BY MOUTH EVERY DAY 02/09/14  Yes Rowe Clack, MD  cyclobenzaprine (FLEXERIL) 10 MG tablet Take 1 tablet (10 mg total) by mouth 3 (three) times daily as needed for muscle spasms. 08/02/13  Yes Lyndal Pulley, DO  diazepam (VALIUM) 10 MG tablet Take 10 mg by mouth at bedtime as needed for anxiety.   Yes Historical Provider, MD  dicyclomine (BENTYL) 10 MG capsule Take 10 mg by mouth 3 (three) times daily as needed for spasms.   Yes Historical Provider, MD  fluticasone (FLONASE) 50 MCG/ACT nasal spray Place 1 spray into both nostrils at bedtime. Before donning CPAP 05/23/13  Yes Rowe Clack, MD  furosemide (LASIX) 40 MG tablet Take 2 tablets (80 mg total) by mouth 2 (two) times daily. 01/11/14  Yes Lelon Perla, MD  gabapentin (NEURONTIN) 100 MG capsule Take 1 capsule (100 mg total) by mouth 2 (two) times daily. 08/09/13  Yes Lyndal Pulley, DO  gabapentin (NEURONTIN) 300 MG capsule Take 300 mg by mouth at bedtime. Daily at bedtime. 05/29/13  Yes Lyndal Pulley, DO  ibuprofen (ADVIL,MOTRIN) 600 MG tablet Take 1 tablet (600 mg total) by mouth every 6 (six) hours as needed. 10/21/13  Yes Varney Biles, MD  losartan (COZAAR) 50 MG tablet Take 1 tablet (50 mg total) by mouth daily. 10/20/13  Yes Lelon Perla, MD  pantoprazole (PROTONIX) 20 MG tablet Take 1 tablet (20 mg total) by mouth 2 (two) times daily. 07/27/13  Yes Jessica D. Zehr, PA-C  potassium chloride SA (K-DUR,KLOR-CON) 20 MEQ tablet Take 2 tablets (40 mEq total) by mouth 2 (two) times daily. 02/17/13  Yes Lelon Perla, MD  Prenatal Multivit-Min-Fe-FA (PRENATAL 1 + IRON PO) Take 1 tablet by mouth.   Yes Historical Provider, MD  Skin Protectants, Misc. (EUCERIN) cream Apply 1 application topically as needed for dry skin.   Yes Historical Provider, MD  spironolactone (ALDACTONE) 25 MG tablet Take 1 tablet (25 mg total) by mouth daily. 10/20/13  Yes Lelon Perla, MD  sucralfate (CARAFATE) 1 G tablet  Take 1 g by mouth daily.   Yes Historical Provider, MD  Vilazodone HCl (VIIBRYD) 10 MG TABS Take 10 mg by mouth daily.   Yes Historical Provider, MD  Vitamin D, Ergocalciferol, (DRISDOL) 50000 UNITS CAPS capsule Take 50,000 Units by mouth every 7 (seven) days. 01/17/14  Yes Lyndal Pulley, DO   BP 142/86  Pulse 88  Temp(Src) 97.9 F (36.6 C) (Oral)  Resp 20  Ht 5\' 5"  (1.651 m)  Wt 253 lb 1.4 oz (114.8 kg)  BMI 42.12 kg/m2  SpO2 100%  LMP 02/18/2014 Physical Exam  Constitutional: She is oriented to person, place, and time. She appears well-developed and well-nourished. No distress.  HENT:  Head: Normocephalic and atraumatic.  Mouth/Throat: No oropharyngeal exudate.  Eyes: Pupils are equal, round, and reactive to light.  Neck: Normal range of motion. Neck  supple.  Cardiovascular: Regular rhythm and normal heart sounds.  Tachycardia present.  Exam reveals no gallop and no friction rub.   No murmur heard. Pulmonary/Chest: Effort normal and breath sounds normal. No respiratory distress. She has no wheezes. She has no rales.  Abdominal: Soft. Bowel sounds are normal. She exhibits no distension and no mass. There is tenderness in the epigastric area. There is no rigidity, no rebound and no guarding.  Musculoskeletal: Normal range of motion. She exhibits no edema and no tenderness.  Neurological: She is alert and oriented to person, place, and time.  Skin: Skin is warm and dry.  Psychiatric: She has a normal mood and affect.    ED Course  Procedures (including critical care time) Labs Review Labs Reviewed  PRO B NATRIURETIC PEPTIDE - Abnormal; Notable for the following:    Pro B Natriuretic peptide (BNP) 947.6 (*)    All other components within normal limits  D-DIMER, QUANTITATIVE - Abnormal; Notable for the following:    D-Dimer, Quant 0.83 (*)    All other components within normal limits  CBC WITH DIFFERENTIAL  BASIC METABOLIC PANEL  HEPATIC FUNCTION PANEL  LIPASE, BLOOD   I-STAT TROPOININ, ED  Randolm Idol, ED    Imaging Review Dg Chest 2 View  02/19/2014   CLINICAL DATA:  Central chest pain for 1 day, history hypertension, CHF, DVT  EXAM: CHEST  2 VIEW  COMPARISON:  10/03/2013  FINDINGS: Enlargement of cardiac silhouette.  Mediastinal contours and pulmonary vascularity normal.  Lungs clear.  Slight elevation RIGHT diaphragm unchanged.  No pleural effusion or pneumothorax.  Bones unremarkable.  IMPRESSION: Enlargement of cardiac silhouette.  No acute abnormalities.   Electronically Signed   By: Lavonia Dana M.D.   On: 02/19/2014 17:09   Nm Pulmonary Perf And Vent  02/19/2014   CLINICAL DATA:  Chest pain, tachycardia, elevated D-dimer.  EXAM: NUCLEAR MEDICINE VENTILATION - PERFUSION LUNG SCAN  TECHNIQUE: Ventilation images were obtained in multiple projections using inhaled aerosol technetium 99 M DTPA. Perfusion images were obtained in multiple projections after intravenous injection of Tc-57m MAA.  RADIOPHARMACEUTICALS:  40 mCi Tc-55m DTPA aerosol and 6 mCi Tc-47m MAA  COMPARISON:  Chest 02/19/2014, V/Q scan 12/30/2012.  FINDINGS: Ventilation: Mostly homogeneous distribution of tracer activity throughout the lungs without focal defect. Tracer activity also identified in the gastrointestinal tract.  Perfusion: No wedge shaped peripheral perfusion defects to suggest acute pulmonary embolism.  IMPRESSION: Very low probability of pulmonary embolus.   Electronically Signed   By: Lucienne Capers M.D.   On: 02/19/2014 23:31     EKG Interpretation   Date/Time:  Monday February 19 2014 16:09:05 EDT Ventricular Rate:  103 PR Interval:  178 QRS Duration: 82 QT Interval:  358 QTC Calculation: 468 R Axis:   130 Text Interpretation:  Sinus tachycardia with Premature atrial complexes  Right axis deviation Possible Anterior infarct , age undetermined Abnormal  ECG No significant change since last tracing Confirmed by Caragh Gasper  MD,  [place] [phone]) on 02/19/2014 5:02:34  PM      MDM   Final diagnoses:  Chest pain, unspecified chest pain type    Pt is a 38 y.o. female with Pmhx as above who presents with about 10 hrs of constant, though waxing/waning epigastric/low chest pain that woke her from sleep. It was worse during cardiac rehab and she has had assoc nausea, diaphoresis. No SOB,no in LE edema, no fever, no chills. On PE, HR 100 which she states is her baseline;  however, in chart review, she had a resting HR at cardiac rehab today of 127. +epigastric ttp. EKG w/o acute changes. Trop neg. D-dimer elevated. CXR w/ cardiomegaly. Lipase & LFTs nml. Cannot r/o PE, spoke w/ radiology and will get VQ scan. Cardiology consulted & will see in the ED.    12:12 V/Q negative. Delta trop negative. I spoke again w/ Dr. Claiborne Billings who did not feel comfortable with d/c given pain is unchanged after ASA, morphine, GI cocktail. I spoke w/ Dr. Hal Hope who will admit to stepdown given she is still having pain. Will try Toradol and NTG for pain.       Ernestina Patches, MD 02/20/14 [phone]

## 2014-02-19 NOTE — Telephone Encounter (Signed)
Form completed with rx Please fax thanks

## 2014-02-19 NOTE — Telephone Encounter (Signed)
[name], nurse with cardiac rehab, called due to patient's symptoms this afternoon. 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 Seen by Dr. Stanford Breed 12/2013 at which time Lasix was increased. Today at cardiac rehab she presented with resting HR 127 (was 96 at OP visit 12/2013) along with upper abdominal discomfort that began about 5am. Exercise did not make it worse. Weight reported to be around 114 kg; office weight was 112.5 kg in 12/2013. HR now low 100s. O2 sat 98%. She is having frequent PVCs per [name] but reportedly has a hx of PVCs. I think given her increased tachy and symptoms she should be evaluated in person rather than on the phone. It is not clear that she requires ER care at this point so I have spoken with our DOD nurse today to try to facilitate getting her into the office to be seen this afternoon. Megan Massett PA-C

## 2014-02-19 NOTE — Consult Note (Addendum)
Reason for Consult: chest pain, previous peripartum cardiomyopathy  Primary cardiologist: Dr. Stanford Breed Referring Physician: Dr. Ruffin Frederick [name] is an 38 y.o. female.  HPI: [name] is a 38 yo woman with a PMH of hypertension, prior DVT ('14) and postpartum cardiomyopathy dating back 15 years with 6/15 EF of 35-40% with restrictive filling who presents from cardiac rehabilitation for epigastric chest discomfort. She's had the symptoms for 10 hours and initially the pain initially woke her from sleep. She has no associated shortness of breath. During cardiac rehabilitation the chest/epipastrium discomfort was slightly worse leading to ER transfer/presentation. Initial troponin negative and d-dimer elevated. She tells me she felt worse and then better after the GI cocktail. The pain has improved mildly since this AM but been mainly persistent. She was initially in atrial fibrillation and now in sinus rhythm.      Past Medical History  Diagnosis Date  . Hypertension   . Ovarian cyst     1999; surgically removed  . OSA (obstructive sleep apnea)     on CPAP   . IBS (irritable bowel syndrome)   . 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 Depression   . Anxiety   . Fibroid     age 26  . Termination of pregnancy     due to cardiac risk  . DVT (deep venous thrombosis)     2014  . CHF (congestive heart failure)   . Gallstones     Past Surgical History  Procedure Laterality Date  . Abdominal surgery      several  . Cesarean section    . Cholecystectomy    . Foot surgery Right   . Tubal ligation  1999    Family History  Problem Relation Age of Onset  . Emphysema Maternal Grandmother     smoked  . Allergies Daughter   . Heart disease Maternal Grandmother 68    MI  . Rheum arthritis Mother     Social History:  reports that she has never smoked. She has never used smokeless tobacco. She reports that she does not drink alcohol or use illicit  drugs.  Allergies:  Allergies  Allergen Reactions  . Ivp Dye [Iodinated Diagnostic Agents] Other (See Comments)    Did something to my liver and kidneys told not to use it  . Vancomycin Other (See Comments)    "did something to my kidneys"; went into kidney failure  . Aspirin Other (See Comments)    Wheezing; but patient still takes if she needs to  . Ciprofloxacin Itching and Rash  . Sulfa Antibiotics Itching and Rash    Medications: I have reviewed the patient's current medications. Prior to Admission:  (Not in a hospital admission) Scheduled: Continuous:  Results for orders placed during the hospital encounter of 02/19/14 (from the past 48 hour(s))  CBC WITH DIFFERENTIAL     Status: None   Collection Time    02/19/14  4:17 PM      Result Value Ref Range   WBC 7.5  4.0 - 10.5 K/uL   RBC 4.55  3.87 - 5.11 MIL/uL   Hemoglobin 13.0  12.0 - 15.0 g/dL   HCT 39.6  36.0 - 46.0 %   MCV 87.0  78.0 - 100.0 fL   MCH 28.6  26.0 - 34.0 pg   MCHC 32.8  30.0 - 36.0 g/dL   RDW 13.7  11.5 - 15.5 %   Platelets 287  150 -  400 K/uL   Neutrophils Relative % 60  43 - 77 %   Neutro Abs 4.5  1.7 - 7.7 K/uL   Lymphocytes Relative 33  12 - 46 %   Lymphs Abs 2.4  0.7 - 4.0 K/uL   Monocytes Relative 6  3 - 12 %   Monocytes Absolute 0.4  0.1 - 1.0 K/uL   Eosinophils Relative 1  0 - 5 %   Eosinophils Absolute 0.1  0.0 - 0.7 K/uL   Basophils Relative 0  0 - 1 %   Basophils Absolute 0.0  0.0 - 0.1 K/uL  BASIC METABOLIC PANEL     Status: None   Collection Time    02/19/14  4:17 PM      Result Value Ref Range   Sodium 138  137 - 147 mEq/L   Potassium 4.0  3.7 - 5.3 mEq/L   Chloride 100  96 - 112 mEq/L   CO2 27  19 - 32 mEq/L   Glucose, Bld 88  70 - 99 mg/dL   BUN 7  6 - 23 mg/dL   Creatinine, Ser 0.75  0.50 - 1.10 mg/dL   Calcium 9.4  8.4 - 10.5 mg/dL   GFR calc non Af Amer >90  >90 mL/min   GFR calc Af Amer >90  >90 mL/min   Comment: (NOTE)     The eGFR has been calculated using the CKD  EPI equation.     This calculation has not been validated in all clinical situations.     eGFR's persistently <90 mL/min signify possible Chronic Kidney     Disease.   Anion gap 11  5 - 15  I-STAT TROPOININ, ED     Status: None   Collection Time    02/19/14  4:27 PM      Result Value Ref Range   Troponin i, poc 0.00  0.00 - 0.08 ng/mL   Comment 3            Comment: Due to the release kinetics of cTnI,     a negative result within the first hours     of the onset of symptoms does not rule out     myocardial infarction with certainty.     If myocardial infarction is still suspected,     repeat the test at appropriate intervals.  PRO B NATRIURETIC PEPTIDE     Status: Abnormal   Collection Time    02/19/14  4:43 PM      Result Value Ref Range   Pro B Natriuretic peptide (BNP) 947.6 (*) 0 - 125 pg/mL  HEPATIC FUNCTION PANEL     Status: None   Collection Time    02/19/14  5:00 PM      Result Value Ref Range   Total Protein 7.5  6.0 - 8.3 g/dL   Albumin 3.9  3.5 - 5.2 g/dL   AST 17  0 - 37 U/L   ALT 12  0 - 35 U/L   Alkaline Phosphatase 110  39 - 117 U/L   Total Bilirubin 0.4  0.3 - 1.2 mg/dL   Bilirubin, Direct <0.2  0.0 - 0.3 mg/dL   Indirect Bilirubin NOT CALCULATED  0.3 - 0.9 mg/dL  LIPASE, BLOOD     Status: None   Collection Time    02/19/14  5:00 PM      Result Value Ref Range   Lipase 21  11 - 59 U/L  D-DIMER, QUANTITATIVE  Status: Abnormal   Collection Time    02/19/14  5:00 PM      Result Value Ref Range   D-Dimer, Quant 0.83 (*) 0.00 - 0.48 ug/mL-FEU   Comment:            AT THE INHOUSE ESTABLISHED CUTOFF     VALUE OF 0.48 ug/mL FEU,     THIS ASSAY HAS BEEN DOCUMENTED     IN THE LITERATURE TO HAVE     A SENSITIVITY AND NEGATIVE     PREDICTIVE VALUE OF AT LEAST     98 TO 99%.  THE TEST RESULT     SHOULD BE CORRELATED WITH     AN ASSESSMENT OF THE CLINICAL     PROBABILITY OF DVT / VTE.    Dg Chest 2 View  02/19/2014   CLINICAL DATA:  Central chest pain  for 1 day, history hypertension, CHF, DVT  EXAM: CHEST  2 VIEW  COMPARISON:  10/03/2013  FINDINGS: Enlargement of cardiac silhouette.  Mediastinal contours and pulmonary vascularity normal.  Lungs clear.  Slight elevation RIGHT diaphragm unchanged.  No pleural effusion or pneumothorax.  Bones unremarkable.  IMPRESSION: Enlargement of cardiac silhouette.  No acute abnormalities.   Electronically Signed   By: Lavonia Dana M.D.   On: 02/19/2014 17:09    Review of Systems  Constitutional: Negative for fever, chills and weight loss.  HENT: Negative for ear discharge.   Eyes: Negative for double vision and discharge.  Respiratory: Negative for cough, hemoptysis and sputum production.   Cardiovascular: Negative for palpitations, orthopnea and claudication.       Epigastric discomfort  Gastrointestinal: Positive for heartburn and nausea. Negative for abdominal pain and diarrhea.  Genitourinary: Negative for dysuria, hematuria and flank pain.  Musculoskeletal: Negative for myalgias and neck pain.  Skin: Negative for rash.  Neurological: Negative for dizziness, tingling, tremors and headaches.  Endo/Heme/Allergies: Negative for polydipsia. Does not bruise/bleed easily.  Psychiatric/Behavioral: Negative for depression, suicidal ideas, hallucinations and substance abuse.   Blood pressure 141/88, pulse 95, temperature 97.9 F (36.6 C), temperature source Oral, resp. rate 20, height 5' 5"  (1.651 m), weight 114.8 kg (253 lb 1.4 oz), last menstrual period 02/19/2014, SpO2 100.00%. Physical Exam  Nursing note and vitals reviewed. Constitutional: She is oriented to person, place, and time. She appears well-developed and well-nourished. No distress.  HENT:  Head: Normocephalic and atraumatic.  Nose: Nose normal.  Mouth/Throat: Oropharynx is clear and moist. No oropharyngeal exudate.  Eyes: Conjunctivae and EOM are normal. Pupils are equal, round, and reactive to light. No scleral icterus.  Neck: Normal range  of motion. Neck supple. JVD present. No tracheal deviation present.  JVP 2 cm above clavicle at 90 degrees with slight HJR  Cardiovascular: Normal rate, regular rhythm, normal heart sounds and intact distal pulses.  Exam reveals no gallop.   No murmur heard. Respiratory: Effort normal and breath sounds normal. No respiratory distress. She has no wheezes. She has no rales.  GI: Soft. Bowel sounds are normal. She exhibits no distension. There is no tenderness. There is no rebound.  Musculoskeletal: Normal range of motion. She exhibits no edema and no tenderness.  Neurological: She is alert and oriented to person, place, and time. She has normal reflexes. No cranial nerve deficit. Coordination normal.  Skin: Skin is warm and dry. No rash noted. She is not diaphoretic. No erythema.  Psychiatric: She has a normal mood and affect. Her behavior is normal. Thought content normal.   Labs  reviewed; na 138, K 4.0, bun/cr 7/0.75, ast/alt 17/12, ProBNP 950, d-dimer 0.8 Chest x-ray: no acute process, enlarged cardiac borders ECG: PVC, RAD, anterior infarct old borderline, sinus tach ~100, Prolonged QT ALL to contrast  Assessment/Plan: Ms. Eckerson is a 38 yo woman with a PMH of hypertension, prior DVT ('14) and postpartum cardiomyopathy dating back 15 years with 6/15 EF of 35-40% with restrictive filling who presents from cardiac rehabilitation for epigastric chest discomfort. Differential is broad including pulmonary embolism, dissection, spontaneous coronary artery dissection, musculoskeletal pain, GERD, esophagitis, esophageal spasm, ACS among other etiologies.  1. Chest Pain: atypical symptoms, negative markers, brief period of atrial fibrillation. On telemetry, trend cardiac markers, V/Q scan pending. I lean towards noncardiac, esophageal/GI etiologies given some improvement with symptoms but definitely needs observation overnight. Consider ischemic evaluation in AM based on overnight findings.  2. Peripartum  cardiomyopathy with systolic heart failure - on spironolactone 25 mg daily, coreg 25 mg bid, losartan 50 mg daily, lasix 80 mg po bid 3. Elevated d-dimer: pending V/Q study 4. Hypertension: continue HF medications as above    Bronnie Vasseur 02/19/2014, 9:00 PM

## 2014-02-19 NOTE — ED Notes (Addendum)
Pt presents to ED from cardiac rehab for evaluation of epigastric/chest pain that has been ongoing since 7am.  Pt goes to cardiac rehab for CHF and cardiomyopathy.  Pt describes pain as a squeezing pressure that comes and goes.  Denies SOB but admits to intermittent nausea.  Respirations e/u, no distress noted at this time.

## 2014-02-19 NOTE — Progress Notes (Signed)
Patient reports having mid upper quadrant abdominal discomfort since this morning at 0700. Patient denies having actual chest pain. Blood pressure 122/80. Telemetry rhythm Sinus with frequent PVC's.  I gave the patient a ginger ale to drink.  [name] feels like she needs to burp and cant.  Resting heart rate Sinus tach 103.  Exercise stopped when patient said she felt an increase in her abdominal discomfort.  Oxygen saturation 98%on room air.  Lung fields clear upon ascultation. No peripheral edema noted. [name] called and notified about the patient's complaints. Patient taken to the ED for further evaluation via wheelchair. Patient notified her husband and daughter.

## 2014-02-20 ENCOUNTER — Encounter (HOSPITAL_COMMUNITY): Payer: Self-pay | Admitting: Internal Medicine

## 2014-02-20 ENCOUNTER — Observation Stay (HOSPITAL_COMMUNITY): Payer: 59

## 2014-02-20 ENCOUNTER — Encounter: Payer: 59 | Admitting: Cardiology

## 2014-02-20 DIAGNOSIS — G4733 Obstructive sleep apnea (adult) (pediatric): Secondary | ICD-10-CM

## 2014-02-20 DIAGNOSIS — R079 Chest pain, unspecified: Secondary | ICD-10-CM

## 2014-02-20 DIAGNOSIS — R0989 Other specified symptoms and signs involving the circulatory and respiratory systems: Secondary | ICD-10-CM

## 2014-02-20 DIAGNOSIS — O903 Peripartum cardiomyopathy: Secondary | ICD-10-CM

## 2014-02-20 DIAGNOSIS — I10 Essential (primary) hypertension: Secondary | ICD-10-CM

## 2014-02-20 DIAGNOSIS — R0609 Other forms of dyspnea: Secondary | ICD-10-CM

## 2014-02-20 LAB — COMPREHENSIVE METABOLIC PANEL
ALT: 28 U/L (ref 0–35)
AST: 44 U/L — ABNORMAL HIGH (ref 0–37)
Albumin: 3.4 g/dL — ABNORMAL LOW (ref 3.5–5.2)
Alkaline Phosphatase: 117 U/L (ref 39–117)
Anion gap: 11 (ref 5–15)
BUN: 7 mg/dL (ref 6–23)
CO2: 25 mEq/L (ref 19–32)
Calcium: 8.7 mg/dL (ref 8.4–10.5)
Chloride: 102 mEq/L (ref 96–112)
Creatinine, Ser: 0.76 mg/dL (ref 0.50–1.10)
GFR calc Af Amer: >90 mL/min (ref >90)
GFR calc non Af Amer: >90 mL/min (ref >90)
Glucose, Bld: 91 mg/dL (ref 70–99)
Potassium: 3.7 mEq/L (ref 3.7–5.3)
Sodium: 138 mEq/L (ref 137–147)
Total Bilirubin: 0.5 mg/dL (ref 0.3–1.2)
Total Protein: 7 g/dL (ref 6.0–8.3)

## 2014-02-20 LAB — CBC WITH DIFFERENTIAL/PLATELET
Basophils Absolute: 0 10*3/uL (ref 0.0–0.1)
Basophils Relative: 0 % (ref 0–1)
Eosinophils Absolute: 0.1 10*3/uL (ref 0.0–0.7)
Eosinophils Relative: 1 % (ref 0–5)
HCT: 35.8 % — ABNORMAL LOW (ref 36.0–46.0)
Hemoglobin: 11.7 g/dL — ABNORMAL LOW (ref 12.0–15.0)
Lymphocytes Relative: 39 % (ref 12–46)
Lymphs Abs: 2.4 10*3/uL (ref 0.7–4.0)
MCH: 27.7 pg (ref 26.0–34.0)
MCHC: 32.7 g/dL (ref 30.0–36.0)
MCV: 84.8 fL (ref 78.0–100.0)
Monocytes Absolute: 0.4 10*3/uL (ref 0.1–1.0)
Monocytes Relative: 6 % (ref 3–12)
Neutro Abs: 3.3 10*3/uL (ref 1.7–7.7)
Neutrophils Relative %: 54 % (ref 43–77)
Platelets: 233 10*3/uL (ref 150–400)
RBC: 4.22 MIL/uL (ref 3.87–5.11)
RDW: 13.6 % (ref 11.5–15.5)
WBC: 6.1 10*3/uL (ref 4.0–10.5)

## 2014-02-20 LAB — TROPONIN I
Troponin I: <0.3 ng/mL (ref <0.30)
Troponin I: <0.3 ng/mL (ref <0.30)
Troponin I: <0.3 ng/mL (ref <0.30)

## 2014-02-20 LAB — PRO B NATRIURETIC PEPTIDE: Pro B Natriuretic peptide (BNP): 933.6 pg/mL — ABNORMAL HIGH (ref 0–125)

## 2014-02-20 LAB — RAPID URINE DRUG SCREEN, HOSP PERFORMED
Amphetamines: NOT DETECTED
Barbiturates: NOT DETECTED
Benzodiazepines: NOT DETECTED
Cocaine: NOT DETECTED
Opiates: POSITIVE — AB
Tetrahydrocannabinol: NOT DETECTED

## 2014-02-20 LAB — MRSA PCR SCREENING: MRSA by PCR: NEGATIVE

## 2014-02-20 LAB — PREGNANCY, URINE: Preg Test, Ur: NEGATIVE

## 2014-02-20 LAB — TSH: TSH: 2.28 u[IU]/mL (ref 0.350–4.500)

## 2014-02-20 MED ORDER — FUROSEMIDE 80 MG PO TABS
80.0000 mg | ORAL_TABLET | Freq: Two times a day (BID) | ORAL | Status: DC
  Administered 2014-02-20 – 2014-02-25 (×10): 80 mg via ORAL
  Filled 2014-02-20 (×4): qty 1
  Filled 2014-02-20: qty 4
  Filled 2014-02-20 (×8): qty 1
  Filled 2014-02-20: qty 2
  Filled 2014-02-20 (×2): qty 1

## 2014-02-20 MED ORDER — PANTOPRAZOLE SODIUM 40 MG PO TBEC
40.0000 mg | DELAYED_RELEASE_TABLET | Freq: Two times a day (BID) | ORAL | Status: DC
  Administered 2014-02-20 – 2014-02-26 (×13): 40 mg via ORAL
  Filled 2014-02-20 (×13): qty 1

## 2014-02-20 MED ORDER — ACETAMINOPHEN 650 MG RE SUPP: 650.0000 mg | Freq: Four times a day (QID) | RECTAL | Status: DC | PRN

## 2014-02-20 MED ORDER — GABAPENTIN 300 MG PO CAPS
300.0000 mg | ORAL_CAPSULE | Freq: Every day | ORAL | Status: DC
  Administered 2014-02-20 – 2014-02-25 (×6): 300 mg via ORAL
  Filled 2014-02-20 (×7): qty 1

## 2014-02-20 MED ORDER — MODAFINIL 200 MG PO TABS: 200.0000 mg | ORAL_TABLET | Freq: Every day | ORAL | Status: DC | PRN

## 2014-02-20 MED ORDER — SODIUM CHLORIDE 0.9 % IJ SOLN
3.0000 mL | Freq: Two times a day (BID) | INTRAMUSCULAR | Status: DC
  Administered 2014-02-20 – 2014-02-24 (×9): 3 mL via INTRAVENOUS
  Administered 2014-02-24: 21:00:00 via INTRAVENOUS
  Administered 2014-02-25 – 2014-02-26 (×2): 3 mL via INTRAVENOUS

## 2014-02-20 MED ORDER — ENOXAPARIN SODIUM 40 MG/0.4ML ~~LOC~~ SOLN
40.0000 mg | SUBCUTANEOUS | Status: DC
  Administered 2014-02-20 – 2014-02-25 (×6): 40 mg via SUBCUTANEOUS
  Filled 2014-02-20 (×7): qty 0.4

## 2014-02-20 MED ORDER — VITAMIN D (ERGOCALCIFEROL) 1.25 MG (50000 UNIT) PO CAPS
50000.0000 [IU] | ORAL_CAPSULE | ORAL | Status: DC
  Filled 2014-02-20: qty 1

## 2014-02-20 MED ORDER — PNEUMOCOCCAL VAC POLYVALENT 25 MCG/0.5ML IJ INJ
0.5000 mL | INJECTION | INTRAMUSCULAR | Status: AC
  Administered 2014-02-22: 0.5 mL via INTRAMUSCULAR
  Filled 2014-02-20 (×2): qty 0.5

## 2014-02-20 MED ORDER — ONDANSETRON HCL 4 MG PO TABS: 4.0000 mg | ORAL_TABLET | Freq: Four times a day (QID) | ORAL | Status: DC | PRN

## 2014-02-20 MED ORDER — ARMODAFINIL 250 MG PO TABS: 250.0000 mg | ORAL_TABLET | Freq: Every day | ORAL | Status: DC

## 2014-02-20 MED ORDER — DICYCLOMINE HCL 10 MG PO CAPS
10.0000 mg | ORAL_CAPSULE | Freq: Three times a day (TID) | ORAL | Status: DC | PRN
  Filled 2014-02-20: qty 1

## 2014-02-20 MED ORDER — FLUTICASONE PROPIONATE 50 MCG/ACT NA SUSP
1.0000 | Freq: Every day | NASAL | Status: DC
  Administered 2014-02-20 – 2014-02-25 (×6): 1 via NASAL
  Filled 2014-02-20 (×2): qty 16

## 2014-02-20 MED ORDER — MORPHINE SULFATE 2 MG/ML IJ SOLN
1.0000 mg | INTRAMUSCULAR | Status: DC | PRN
  Administered 2014-02-20 – 2014-02-26 (×6): 1 mg via INTRAVENOUS
  Filled 2014-02-20 (×6): qty 1

## 2014-02-20 MED ORDER — KETOROLAC TROMETHAMINE 30 MG/ML IJ SOLN
30.0000 mg | Freq: Once | INTRAMUSCULAR | Status: AC
  Administered 2014-02-20: 30 mg via INTRAVENOUS
  Filled 2014-02-20: qty 1

## 2014-02-20 MED ORDER — VILAZODONE HCL 10 MG PO TABS
10.0000 mg | ORAL_TABLET | Freq: Every day | ORAL | Status: DC
  Filled 2014-02-20 (×2): qty 1

## 2014-02-20 MED ORDER — LOSARTAN POTASSIUM 50 MG PO TABS
50.0000 mg | ORAL_TABLET | Freq: Every day | ORAL | Status: DC
  Administered 2014-02-20 – 2014-02-26 (×7): 50 mg via ORAL
  Filled 2014-02-20 (×6): qty 1

## 2014-02-20 MED ORDER — CYCLOBENZAPRINE HCL 10 MG PO TABS
10.0000 mg | ORAL_TABLET | Freq: Three times a day (TID) | ORAL | Status: DC | PRN
  Filled 2014-02-20: qty 1

## 2014-02-20 MED ORDER — ACETAMINOPHEN 325 MG PO TABS
650.0000 mg | ORAL_TABLET | Freq: Four times a day (QID) | ORAL | Status: DC | PRN
  Administered 2014-02-20 – 2014-02-24 (×3): 650 mg via ORAL
  Filled 2014-02-20 (×3): qty 2

## 2014-02-20 MED ORDER — DIAZEPAM 5 MG PO TABS: 10.0000 mg | ORAL_TABLET | Freq: Every evening | ORAL | Status: DC | PRN

## 2014-02-20 MED ORDER — NITROGLYCERIN 0.4 MG SL SUBL
0.4000 mg | SUBLINGUAL_TABLET | SUBLINGUAL | Status: AC | PRN
Start: 2014-02-20 — End: 2014-02-20
  Administered 2014-02-20 (×3): 0.4 mg via SUBLINGUAL
  Filled 2014-02-20 (×3): qty 1

## 2014-02-20 MED ORDER — PANTOPRAZOLE SODIUM 20 MG PO TBEC: 20.0000 mg | DELAYED_RELEASE_TABLET | Freq: Two times a day (BID) | ORAL | Status: DC

## 2014-02-20 MED ORDER — CARVEDILOL 25 MG PO TABS
25.0000 mg | ORAL_TABLET | Freq: Two times a day (BID) | ORAL | Status: DC
  Administered 2014-02-20 – 2014-02-26 (×11): 25 mg via ORAL
  Filled 2014-02-20 (×16): qty 1

## 2014-02-20 MED ORDER — COLESEVELAM HCL 625 MG PO TABS
625.0000 mg | ORAL_TABLET | Freq: Every day | ORAL | Status: DC
  Administered 2014-02-20 – 2014-02-26 (×7): 625 mg via ORAL
  Filled 2014-02-20 (×9): qty 1

## 2014-02-20 MED ORDER — SUCRALFATE 1 G PO TABS
1.0000 g | ORAL_TABLET | Freq: Every day | ORAL | Status: DC
  Administered 2014-02-20 – 2014-02-26 (×7): 1 g via ORAL
  Filled 2014-02-20 (×6): qty 1

## 2014-02-20 MED ORDER — ONDANSETRON HCL 4 MG/2ML IJ SOLN: 4.0000 mg | Freq: Four times a day (QID) | INTRAMUSCULAR | Status: DC | PRN

## 2014-02-20 MED ORDER — ASPIRIN EC 325 MG PO TBEC
325.0000 mg | DELAYED_RELEASE_TABLET | Freq: Every day | ORAL | Status: DC
  Administered 2014-02-20 – 2014-02-21 (×2): 325 mg via ORAL
  Filled 2014-02-20: qty 1

## 2014-02-20 MED ORDER — GABAPENTIN 100 MG PO CAPS
100.0000 mg | ORAL_CAPSULE | Freq: Two times a day (BID) | ORAL | Status: DC
  Administered 2014-02-20 – 2014-02-21 (×2): 100 mg via ORAL
  Filled 2014-02-20 (×3): qty 1

## 2014-02-20 MED ORDER — POTASSIUM CHLORIDE CRYS ER 20 MEQ PO TBCR
40.0000 meq | EXTENDED_RELEASE_TABLET | Freq: Two times a day (BID) | ORAL | Status: DC
  Administered 2014-02-20 – 2014-02-25 (×12): 40 meq via ORAL
  Filled 2014-02-20 (×11): qty 2

## 2014-02-20 MED ORDER — SPIRONOLACTONE 25 MG PO TABS
25.0000 mg | ORAL_TABLET | Freq: Every day | ORAL | Status: DC
  Administered 2014-02-20 – 2014-02-26 (×7): 25 mg via ORAL
  Filled 2014-02-20 (×6): qty 1

## 2014-02-20 NOTE — ED Notes (Signed)
B/P 124/81  P. 90  Pt st's chest pain is #2 on pain scale 0/10  3'rd NTG SL given for same

## 2014-02-20 NOTE — ED Notes (Signed)
Attempted report 

## 2014-02-20 NOTE — Telephone Encounter (Signed)
CPAP supplies rx faxed to [facility]

## 2014-02-20 NOTE — Progress Notes (Signed)
Cardiac rehab strips reviewed and only showed NSR/ST with occ PVCs. No AF. Aldyn Toon PA-C

## 2014-02-20 NOTE — Progress Notes (Signed)
Patient seen and examined and agree with note as outlined by [name], PA-C.  Admitted with atypical chest pain that has been constant with normal cardiac enzymes and no ischemia on EKG.  VQ scan low prob for PE.  Mildly tachycardic so will check TSH.  Will get BNP to rule out volume overload as etiology of chest discomfort but unlikely.  Her CP does not sound typical for acute pericarditis in that it is worse sitting than lying supine and is not pleuritic.  Will get 2 D echo to assess.  This is unlikely to be coronary ischemia given her age but her mom had an MI at age 73.  Will plan 2 day Stress myoview due to her obesity.  All rhythm strips reviewed and cannot find any documentation of PAF as mentioned by the on call admitting MD last night.  Should consider outpt event monitor to assess further.

## 2014-02-20 NOTE — ED Notes (Signed)
B/P 142/92 P 86  Pt st's pain is a #3 on pain scale 0/10.  2'nd NTG SL given for same

## 2014-02-20 NOTE — Progress Notes (Signed)
Patient: [name] / Admit Date: 02/19/2014 / Date of Encounter: 02/20/2014, 1:58 PM   Subjective: Epigastric/CP persists.   Objective: Telemetry: NSR/borderline sinus tach Physical Exam: Blood pressure 132/73, pulse 81, temperature 98.3 F (36.8 C), temperature source Oral, resp. rate 13, height 5\' 5"  (1.651 m), weight 260 lb 12.9 oz (118.3 kg), last menstrual period 02/18/2014, SpO2 98.00%. General: Well developed, well nourished F in no acute distress. Head: Normocephalic, atraumatic, sclera non-icteric, no xanthomas, nares are without discharge. Neck: Negative for carotid bruits. JVP not elevated. Lungs: Clear bilaterally to auscultation without wheezes, rales, or rhonchi. Breathing is unlabored. Heart: RRR S1 S2 borderline tachycardia without murmurs, rubs, or gallops.  Abdomen: Soft, non-tender, non-distended with normoactive bowel sounds. No rebound/guarding. Extremities: No clubbing or cyanosis. No edema. Distal pedal pulses are 2+ and equal bilaterally. Neuro: Alert and oriented X 3. Moves all extremities spontaneously. Psych:  Responds to questions appropriately with a normal affect.   Intake/Output Summary (Last 24 hours) at 02/20/14 1358 Last data filed at 02/20/14 0348  Gross per 24 hour  Intake      3 ml  Output      0 ml  Net      3 ml    Inpatient Medications:  . aspirin EC  325 mg Oral Daily  . carvedilol  25 mg Oral BID WC  . colesevelam  625 mg Oral Q breakfast  . enoxaparin (LOVENOX) injection  40 mg Subcutaneous Q24H  . fluticasone  1 spray Each Nare QHS  . furosemide  80 mg Oral BID  . gabapentin  100 mg Oral BID  . gabapentin  300 mg Oral QHS  . losartan  50 mg Oral Daily  . pantoprazole  40 mg Oral BID  . potassium chloride SA  40 mEq Oral BID  . sodium chloride  3 mL Intravenous Q12H  . spironolactone  25 mg Oral Daily  . sucralfate  1 g Oral Daily  . Vilazodone HCl  10 mg Oral Daily  . Vitamin D (Ergocalciferol)  50,000 Units Oral Q7 days     Infusions:    Labs:  Recent Labs  02/19/14 1617 02/20/14 0406  NA 138 138  K 4.0 3.7  CL 100 102  CO2 27 25  GLUCOSE 88 91  BUN 7 7  CREATININE 0.75 0.76  CALCIUM 9.4 8.7    Recent Labs  02/19/14 1700 02/20/14 0406  AST 17 44*  ALT 12 28  ALKPHOS 110 117  BILITOT 0.4 0.5  PROT 7.5 7.0  ALBUMIN 3.9 3.4*    Recent Labs  02/19/14 1617 02/20/14 0406  WBC 7.5 6.1  NEUTROABS 4.5 3.3  HGB 13.0 11.7*  HCT 39.6 35.8*  MCV 87.0 84.8  PLT 287 233    Recent Labs  02/20/14 0405 02/20/14 1139  TROPONINI <0.30 <0.30   Radiology/Studies:  Dg Chest 2 View  02/19/2014   CLINICAL DATA:  Central chest pain for 1 day, history hypertension, CHF, DVT  EXAM: CHEST  2 VIEW  COMPARISON:  10/03/2013  FINDINGS: Enlargement of cardiac silhouette.  Mediastinal contours and pulmonary vascularity normal.  Lungs clear.  Slight elevation RIGHT diaphragm unchanged.  No pleural effusion or pneumothorax.  Bones unremarkable.  IMPRESSION: Enlargement of cardiac silhouette.  No acute abnormalities.   Electronically Signed   By: Lavonia Dana M.D.   On: 02/19/2014 17:09   Nm Pulmonary Perf And Vent  02/19/2014   CLINICAL DATA:  Chest pain, tachycardia, elevated D-dimer.  EXAM:  NUCLEAR MEDICINE VENTILATION - PERFUSION LUNG SCAN  TECHNIQUE: Ventilation images were obtained in multiple projections using inhaled aerosol technetium 99 M DTPA. Perfusion images were obtained in multiple projections after intravenous injection of Tc-17m MAA.  RADIOPHARMACEUTICALS:  40 mCi Tc-65m DTPA aerosol and 6 mCi Tc-13m MAA  COMPARISON:  Chest 02/19/2014, V/Q scan 12/30/2012.  FINDINGS: Ventilation: Mostly homogeneous distribution of tracer activity throughout the lungs without focal defect. Tracer activity also identified in the gastrointestinal tract.  Perfusion: No wedge shaped peripheral perfusion defects to suggest acute pulmonary embolism.  IMPRESSION: Very low probability of pulmonary embolus.   Electronically  Signed   By: Lucienne Capers M.D.   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   1. Chest pain, atypical symptoms, question GI etiology -  r/o for MI, VQ scan negative for PE - check pBNP, 2D echo, abdominal ultrasound - will also order nuclear stress test since she has never had ischemic evaluation - she will likely need to be a 2 day study based on weight 2. 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 Sinus tachycardia  - appears chronic with no clear etiology. - check TSH, free T4; agree with UDS and uhCG - there is mention of "brief atrial fibrillation" in fellow note - telemetry is unfortunately not available from her time in the ED, no strips in chart, and no further details are available. She has only been in NSR the entire time on telemetry. Cardiac rehab says she was not in AF there - they are faxing strips to the unit. Will need event monitoring at d/c to further elucidate. 4. Hypertension  - continue HF medications as above  Signed, Melina Copa PA-C

## 2014-02-20 NOTE — Progress Notes (Signed)
Patient admitted earlier today. H&P reviewed.  Patient reports 5/10 chest pain. Has been continuous for 24 hours at this point. Denies acid reflux but does take PPI and carafate at home.  VSS Lungs CTA Heart: S1S2 N reg, no s3s4. No rubs murmurs bruits. Abdo: Soft, NT, ND, BS present. No masses or organomegaly. No edema A&Ox3. No focal deficits.  Atypical CP likely not cardiac. VQ scan was low probability. Cards following. Trops pending. Morphine PRM. Will increase PPI to BID. Has never had stress tests per patient. Management per cards.  History of cardiomyopathy (post partum): Euvolemic currently. EF was 35% in June 2015. On diuretics and ARB.  Follow closely.  Bonnielee Haff 02/20/2014

## 2014-02-20 NOTE — ED Notes (Signed)
Dr. Maryland Pink, hospitalist at bedside with patient. Patient currently rating pain 5/10, pressure mid sternum while resting in bed. VSS. Waiting for new orders.

## 2014-02-20 NOTE — Progress Notes (Signed)
CPAP is set up in patients room, pt is not ready to go on CPAP at this time. RT will place on CPAP when ready

## 2014-02-20 NOTE — ED Notes (Signed)
B/P 127/95 P 81  Pt rates chest pain #5.5 on pain scale 0/10   NTG 1 SL given for same

## 2014-02-20 NOTE — ED Notes (Signed)
Pt st's chest pain is #1 on pain scale 0/10.

## 2014-02-20 NOTE — Progress Notes (Signed)
This encounter was created in error - please disregard.

## 2014-02-20 NOTE — Progress Notes (Signed)
Utilization Review Completed.[name] T9/29/2015  

## 2014-02-20 NOTE — H&P (Signed)
[facility] History and Physical  SIOBAHN WORSLEY WLN:[id number] DOB: March 15, 1976 DOA: 02/19/2014  Referring physician: ER physician. PCP: [name], MD   Chief Complaint: Chest pain.  HPI: [name] is a 38 y.o. female with history of postpartum cardiomyopathy hypertension OSA presents the ER because of chest pain. Patient was at cardiac rehabilitation when patient started developing chest pain which was retrosternal in the epigastrium nonradiating stabbing in nature and sometimes pressure-like constant has associated nausea but no vomiting or shortness of breath denies any fever chills or productive cough. In the ER VQ scan was negative for PE. EKG was showing sinus tachycardia cardiac markers were negative and on-call cardiologist was consulted and patient will be admitted for further cycle of troponins and rule out ACS. Patient did take one dose of ibuprofen last night since patient had some toothache. Patient was given morphine and GI cocktail. After patient received GI cocktail patient's pain increased. Since patient's pain is persistent patient will be admitted to step down.   Review of Systems: As presented in the history of presenting illness, rest negative.  Past Medical History  Diagnosis Date  . Hypertension   . Ovarian cyst     1999; surgically removed  . OSA (obstructive sleep apnea)     on CPAP   . IBS (irritable bowel syndrome)   . 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 Depression   . Anxiety   . Fibroid     age 56  . Termination of pregnancy     due to cardiac risk  . DVT (deep venous thrombosis)     2014  . CHF (congestive heart failure)   . Gallstones    Past Surgical History  Procedure Laterality Date  . Abdominal surgery      several  . Cesarean section    . Cholecystectomy    . Foot surgery Right   . Tubal ligation  1999   Social History:  reports that she has never smoked. She has never used smokeless tobacco.  She reports that she does not drink alcohol or use illicit drugs. Where does patient live home. Can patient participate in ADLs? Yes.  Allergies  Allergen Reactions  . Ivp Dye [Iodinated Diagnostic Agents] Other (See Comments)    Did something to my liver and kidneys told not to use it  . Vancomycin Other (See Comments)    "did something to my kidneys"; went into kidney failure  . Aspirin Other (See Comments)    Wheezing; but patient still takes if she needs to  . Ciprofloxacin Itching and Rash  . Sulfa Antibiotics Itching and Rash    Family History:  Family History  Problem Relation Age of Onset  . Emphysema Maternal Grandmother     smoked  . Allergies Daughter   . Heart disease Maternal Grandmother 50    MI  . Rheum arthritis Mother       Prior to Admission medications   Medication Sig Start Date End Date Taking? Authorizing Provider  Armodafinil (NUVIGIL) 250 MG tablet Take 250 mg by mouth daily.   Yes Historical Provider, MD  Aspirin-Acetaminophen-Caffeine (EXCEDRIN PO) Take 2 tablets by mouth every 6 (six) hours as needed (headache).   Yes Historical Provider, MD  carvedilol (COREG) 25 MG tablet Take 1 tablet (25 mg total) by mouth 2 (two) times daily with a meal. 01/08/14  Yes Lelon Perla, MD  colesevelam (WELCHOL) 625 MG tablet TAKE 1 TABLET BY  MOUTH EVERY DAY 02/09/14  Yes Rowe Clack, MD  cyclobenzaprine (FLEXERIL) 10 MG tablet Take 1 tablet (10 mg total) by mouth 3 (three) times daily as needed for muscle spasms. 08/02/13  Yes Lyndal Pulley, DO  diazepam (VALIUM) 10 MG tablet Take 10 mg by mouth at bedtime as needed for anxiety.   Yes Historical Provider, MD  dicyclomine (BENTYL) 10 MG capsule Take 10 mg by mouth 3 (three) times daily as needed for spasms.   Yes Historical Provider, MD  fluticasone (FLONASE) 50 MCG/ACT nasal spray Place 1 spray into both nostrils at bedtime. Before donning CPAP 05/23/13  Yes Rowe Clack, MD  furosemide (LASIX) 40 MG  tablet Take 2 tablets (80 mg total) by mouth 2 (two) times daily. 01/11/14  Yes Lelon Perla, MD  gabapentin (NEURONTIN) 100 MG capsule Take 1 capsule (100 mg total) by mouth 2 (two) times daily. 08/09/13  Yes Lyndal Pulley, DO  gabapentin (NEURONTIN) 300 MG capsule Take 300 mg by mouth at bedtime. Daily at bedtime. 05/29/13  Yes Lyndal Pulley, DO  ibuprofen (ADVIL,MOTRIN) 600 MG tablet Take 1 tablet (600 mg total) by mouth every 6 (six) hours as needed. 10/21/13  Yes Varney Biles, MD  losartan (COZAAR) 50 MG tablet Take 1 tablet (50 mg total) by mouth daily. 10/20/13  Yes Lelon Perla, MD  pantoprazole (PROTONIX) 20 MG tablet Take 1 tablet (20 mg total) by mouth 2 (two) times daily. 07/27/13  Yes Jessica D. Zehr, PA-C  potassium chloride SA (K-DUR,KLOR-CON) 20 MEQ tablet Take 2 tablets (40 mEq total) by mouth 2 (two) times daily. 02/17/13  Yes Lelon Perla, MD  Prenatal Multivit-Min-Fe-FA (PRENATAL 1 + IRON PO) Take 1 tablet by mouth.   Yes Historical Provider, MD  Skin Protectants, Misc. (EUCERIN) cream Apply 1 application topically as needed for dry skin.   Yes Historical Provider, MD  spironolactone (ALDACTONE) 25 MG tablet Take 1 tablet (25 mg total) by mouth daily. 10/20/13  Yes Lelon Perla, MD  sucralfate (CARAFATE) 1 G tablet Take 1 g by mouth daily.   Yes Historical Provider, MD  Vilazodone HCl (VIIBRYD) 10 MG TABS Take 10 mg by mouth daily.   Yes Historical Provider, MD  Vitamin D, Ergocalciferol, (DRISDOL) 50000 UNITS CAPS capsule Take 50,000 Units by mouth every 7 (seven) days. 01/17/14  Yes Lyndal Pulley, DO    Physical Exam: Filed Vitals:   02/19/14 1616 02/19/14 1923 02/19/14 2124 02/19/14 2327  BP: 148/96 141/88 142/84 142/86  Pulse: 98 95 91 88  Temp: 97.9 F (36.6 C)     TempSrc: Oral     Resp: 18 20 20 20   Height: 5\' 5"  (1.651 m)     Weight: 114.8 kg (253 lb 1.4 oz)     SpO2: 100% 100% 100% 100%     General:  Obese not in acute distress.  Eyes:  Anicteric no pallor.  ENT: No discharge from the ears eyes nose mouth.  Neck: No mass felt.  Cardiovascular: S1-S2 heard.  Respiratory: No rhonchi or crepitations.  Abdomen: Soft nontender bowel sounds present. No guarding rigidity.  Skin: No rash.  Musculoskeletal: No edema.  Psychiatric: Appears normal.  Neurologic: Alert awake oriented to time place and person. Moves all extremities.  Labs on Admission:  Basic Metabolic Panel:  Recent Labs Lab 02/19/14 1617  NA 138  K 4.0  CL 100  CO2 27  GLUCOSE 88  BUN 7  CREATININE 0.75  CALCIUM 9.4   Liver Function Tests:  Recent Labs Lab 02/19/14 1700  AST 17  ALT 12  ALKPHOS 110  BILITOT 0.4  PROT 7.5  ALBUMIN 3.9    Recent Labs Lab 02/19/14 1700  LIPASE 21   No results found for this basename: AMMONIA,  in the last 168 hours CBC:  Recent Labs Lab 02/19/14 1617  WBC 7.5  NEUTROABS 4.5  HGB 13.0  HCT 39.6  MCV 87.0  PLT 287   Cardiac Enzymes: No results found for this basename: CKTOTAL, CKMB, CKMBINDEX, TROPONINI,  in the last 168 hours  BNP (last 3 results)  Recent Labs  09/29/13 1229 10/03/13 1935 02/19/14 1643  PROBNP 129.0* 1319.0* 947.6*   CBG: No results found for this basename: GLUCAP,  in the last 168 hours  Radiological Exams on Admission: Dg Chest 2 View  02/19/2014   CLINICAL DATA:  Central chest pain for 1 day, history hypertension, CHF, DVT  EXAM: CHEST  2 VIEW  COMPARISON:  10/03/2013  FINDINGS: Enlargement of cardiac silhouette.  Mediastinal contours and pulmonary vascularity normal.  Lungs clear.  Slight elevation RIGHT diaphragm unchanged.  No pleural effusion or pneumothorax.  Bones unremarkable.  IMPRESSION: Enlargement of cardiac silhouette.  No acute abnormalities.   Electronically Signed   By: Lavonia Dana M.D.   On: 02/19/2014 17:09   Nm Pulmonary Perf And Vent  02/19/2014   CLINICAL DATA:  Chest pain, tachycardia, elevated D-dimer.  EXAM: NUCLEAR MEDICINE VENTILATION  - PERFUSION LUNG SCAN  TECHNIQUE: Ventilation images were obtained in multiple projections using inhaled aerosol technetium 99 M DTPA. Perfusion images were obtained in multiple projections after intravenous injection of Tc-59m MAA.  RADIOPHARMACEUTICALS:  40 mCi Tc-40m DTPA aerosol and 6 mCi Tc-76m MAA  COMPARISON:  Chest 02/19/2014, V/Q scan 12/30/2012.  FINDINGS: Ventilation: Mostly homogeneous distribution of tracer activity throughout the lungs without focal defect. Tracer activity also identified in the gastrointestinal tract.  Perfusion: No wedge shaped peripheral perfusion defects to suggest acute pulmonary embolism.  IMPRESSION: Very low probability of pulmonary embolus.   Electronically Signed   By: Lucienne Capers M.D.   On: 02/19/2014 23:31    EKG: Independently reviewed. Sinus tachycardia with PVCs.  Assessment/Plan Principal Problem:   Chest pain Active Problems:   Hypertension   OSA (obstructive sleep apnea)   Postpartum cardiomyopathy   1. Chest pain - appears atypical but persistent. Cycle cardiac markers. When necessary nitroglycerin. Aspirin. Patient is already on Carafate and Protonix which will be continued. N.p.o. past 4 AM for possible cardiac procedure. Appreciate cardiology consult. 2. Postpartum cardiomyopathy - last EF was around 35% done on June 2015 - continue spironolactone and Lasix. Closely follow metabolic panel daily weights and intake output. Patient is on Cozaar. 3. OSA on CPAP. 4. Hypertension - continue present medications.    Code Status: Full code.  Family Communication: None.  Disposition Plan: Admit for observation.    Adam Sanjuan N. [facility] Pager [phone].  If 7PM-7AM, please contact night-coverage [email or web address] Password [facility] 02/20/2014, 12:54 AM

## 2014-02-21 ENCOUNTER — Observation Stay (HOSPITAL_COMMUNITY): Payer: 59

## 2014-02-21 ENCOUNTER — Encounter (HOSPITAL_COMMUNITY): Payer: 59

## 2014-02-21 ENCOUNTER — Other Ambulatory Visit (HOSPITAL_COMMUNITY): Payer: 59

## 2014-02-21 DIAGNOSIS — I369 Nonrheumatic tricuspid valve disorder, unspecified: Secondary | ICD-10-CM

## 2014-02-21 DIAGNOSIS — R079 Chest pain, unspecified: Secondary | ICD-10-CM

## 2014-02-21 LAB — CBC
HCT: 36.2 % (ref 36.0–46.0)
Hemoglobin: 11.7 g/dL — ABNORMAL LOW (ref 12.0–15.0)
MCH: 27.7 pg (ref 26.0–34.0)
MCHC: 32.3 g/dL (ref 30.0–36.0)
MCV: 85.6 fL (ref 78.0–100.0)
Platelets: 259 10*3/uL (ref 150–400)
RBC: 4.23 MIL/uL (ref 3.87–5.11)
RDW: 13.8 % (ref 11.5–15.5)
WBC: 6.3 10*3/uL (ref 4.0–10.5)

## 2014-02-21 LAB — T4, FREE: Free T4: 1.09 ng/dL (ref 0.80–1.80)

## 2014-02-21 LAB — COMPREHENSIVE METABOLIC PANEL
ALT: 20 U/L (ref 0–35)
AST: 20 U/L (ref 0–37)
Albumin: 3.4 g/dL — ABNORMAL LOW (ref 3.5–5.2)
Alkaline Phosphatase: 113 U/L (ref 39–117)
Anion gap: 11 (ref 5–15)
BUN: 14 mg/dL (ref 6–23)
CO2: 25 mEq/L (ref 19–32)
Calcium: 9.1 mg/dL (ref 8.4–10.5)
Chloride: 102 mEq/L (ref 96–112)
Creatinine, Ser: 1.16 mg/dL — ABNORMAL HIGH (ref 0.50–1.10)
GFR calc Af Amer: 68 mL/min — ABNORMAL LOW (ref >90)
GFR calc non Af Amer: 59 mL/min — ABNORMAL LOW (ref >90)
Glucose, Bld: 102 mg/dL — ABNORMAL HIGH (ref 70–99)
Potassium: 4.8 mEq/L (ref 3.7–5.3)
Sodium: 138 mEq/L (ref 137–147)
Total Bilirubin: <0.2 mg/dL — ABNORMAL LOW (ref 0.3–1.2)
Total Protein: 7.2 g/dL (ref 6.0–8.3)

## 2014-02-21 MED ORDER — GABAPENTIN 100 MG PO CAPS
100.0000 mg | ORAL_CAPSULE | Freq: Every day | ORAL | Status: DC
  Administered 2014-02-22 – 2014-02-26 (×5): 100 mg via ORAL
  Filled 2014-02-21 (×5): qty 1

## 2014-02-21 MED ORDER — GABAPENTIN 100 MG PO CAPS
100.0000 mg | ORAL_CAPSULE | Freq: Every day | ORAL | Status: DC
  Administered 2014-02-21 – 2014-02-26 (×6): 100 mg via ORAL
  Filled 2014-02-21 (×6): qty 1

## 2014-02-21 MED ORDER — REGADENOSON 0.4 MG/5ML IV SOLN
0.4000 mg | Freq: Once | INTRAVENOUS | Status: AC
  Administered 2014-02-21: 0.4 mg via INTRAVENOUS
  Filled 2014-02-21: qty 5

## 2014-02-21 MED ORDER — VILAZODONE HCL 10 MG PO TABS: 40.0000 mg | ORAL_TABLET | Freq: Every day | ORAL | Status: DC

## 2014-02-21 MED ORDER — REGADENOSON 0.4 MG/5ML IV SOLN
INTRAVENOUS | Status: AC
  Filled 2014-02-21: qty 5

## 2014-02-21 MED ORDER — VILAZODONE HCL 40 MG PO TABS
40.0000 mg | ORAL_TABLET | Freq: Every day | ORAL | Status: DC
  Administered 2014-02-21 – 2014-02-26 (×6): 40 mg via ORAL

## 2014-02-21 MED ORDER — TECHNETIUM TC 99M SESTAMIBI GENERIC - CARDIOLITE
30.0000 | Freq: Once | INTRAVENOUS | Status: AC | PRN
Start: 2014-02-21 — End: 2014-02-21

## 2014-02-21 NOTE — Discharge Summary (Signed)
Physician Discharge Summary  [name] MRN: [id number] DOB/AGE: 1976/01/24 38 y.o.  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     colesevelam 625 MG tablet  Commonly known as:  WELCHOL  TAKE 1 TABLET BY MOUTH EVERY DAY     cyclobenzaprine 10 MG tablet  Commonly known as:  FLEXERIL  Take 1 tablet (10 mg total) by mouth 3 (three) times daily as needed for muscle spasms.     diazepam 10 MG tablet  Commonly known as:  VALIUM  Take 10 mg by mouth at bedtime as needed for anxiety.     dicyclomine 10 MG capsule  Commonly known as:  BENTYL  Take 10 mg by mouth 3 (three) times daily as needed for spasms.     eucerin cream  Apply 1 application topically as needed for dry skin.     fluticasone 50 MCG/ACT nasal spray  Commonly known as:  FLONASE  Place 1 spray into both nostrils at bedtime. Before donning CPAP     furosemide 40 MG tablet  Commonly known as:  LASIX  Take 2 tablets (80 mg total) by mouth 2 (two) times daily.     gabapentin 300 MG capsule  Commonly known as:  NEURONTIN  Take 300 mg by mouth at bedtime. Daily at bedtime.     gabapentin 100 MG capsule  Commonly known as:  NEURONTIN  Take 1 capsule (100 mg total) by mouth 2 (two) times daily.     losartan 50 MG tablet  Commonly known as:  COZAAR  Take 1 tablet (50 mg total) by mouth daily.     NUVIGIL 250 MG tablet  Generic drug:  Armodafinil  Take  250 mg by mouth daily as needed (when using CPAP and still short of breath).     pantoprazole 20 MG tablet  Commonly known as:  PROTONIX  Take 1 tablet (20 mg total) by mouth 2 (two) times daily.     potassium chloride SA 20 MEQ tablet  Commonly known as:  K-DUR,KLOR-CON  Take 2 tablets (40 mEq total) by mouth 2 (two) times daily.     PRENATAL 1 + IRON PO  Take 1 tablet by mouth.     spironolactone 25 MG tablet  Commonly known as:  ALDACTONE  Take 1 tablet (25 mg total) by mouth daily.     sucralfate 1 G tablet  Commonly known as:  CARAFATE  Take 1 g by mouth daily.     VIIBRYD 10 MG Tabs  Generic drug:  Vilazodone HCl  Take 10 mg by mouth daily as needed (depressioni).     Vitamin D (Ergocalciferol) 50000 UNITS Caps capsule  Commonly known as:  DRISDOL  Take 50,000 Units by mouth every 7 (seven) days.  Discharge Condition:   Disposition: 01-Home or Self Care   Consults: Cardiology  Significant Diagnostic Studies: Dg Chest 2 View  02/19/2014   CLINICAL DATA:  Central chest pain for 1 day, history hypertension, CHF, DVT  EXAM: CHEST  2 VIEW  COMPARISON:  10/03/2013  FINDINGS: Enlargement of cardiac silhouette.  Mediastinal contours and pulmonary vascularity normal.  Lungs clear.  Slight elevation RIGHT diaphragm unchanged.  No pleural effusion or pneumothorax.  Bones unremarkable.  IMPRESSION: Enlargement of cardiac silhouette.  No acute abnormalities.   Electronically Signed   By: Lavonia Dana M.D.   On: 02/19/2014 17:09   US Abdomen Complete  02/20/2014   CLINICAL DATA:  Epigastric pain with nausea for 2 days. History of cholecystectomy and biliary stenting.  EXAM: ULTRASOUND ABDOMEN COMPLETE  COMPARISON:  Abdominal pelvic CT 07/19/2013.  FINDINGS: Examination is mildly limited by body habitus.  Gallbladder:  Status post cholecystectomy.  Common bile duct:  Diameter: 8.6 mm. This appears increased from prior CT. No intraductal stones demonstrated.  Liver:  Mildly  increased echogenicity.  No focal abnormality apparent.  IVC:  No abnormality visualized.  Pancreas:  Visualized portion unremarkable.  Spleen:  Size and appearance within normal limits.  Right Kidney:  Length: 12.8 cm. There is a probable small cyst in the interpolar region measuring 8 mm maximally. No suspicious cortical lesion or hydronephrosis.  Left Kidney:  Length: 11.8 cm. Echogenicity within normal limits. No mass or hydronephrosis visualized.  Abdominal aorta:  No aneurysm visualized.  Other findings:  None.  IMPRESSION: 1. Mild extrahepatic biliary dilatation status post cholecystectomy. Although increased from prior CT, laboratory tests today do not show evidence of biliary obstruction. 2. Probable mild hepatic steatosis.   Electronically Signed   By: Camie Patience M.D.   On: 02/20/2014 19:23   Nm Pulmonary Perf And Vent  02/19/2014   CLINICAL DATA:  Chest pain, tachycardia, elevated D-dimer.  EXAM: NUCLEAR MEDICINE VENTILATION - PERFUSION LUNG SCAN  TECHNIQUE: Ventilation images were obtained in multiple projections using inhaled aerosol technetium 99 M DTPA. Perfusion images were obtained in multiple projections after intravenous injection of Tc-13m MAA.  RADIOPHARMACEUTICALS:  40 mCi Tc-71m DTPA aerosol and 6 mCi Tc-70m MAA  COMPARISON:  Chest 02/19/2014, V/Q scan 12/30/2012.  FINDINGS: Ventilation: Mostly homogeneous distribution of tracer activity throughout the lungs without focal defect. Tracer activity also identified in the gastrointestinal tract.  Perfusion: No wedge shaped peripheral perfusion defects to suggest acute pulmonary embolism.  IMPRESSION: Very low probability of pulmonary embolus.   Electronically Signed   By: Lucienne Capers M.D.   On: 02/19/2014 23:31   2-D echo results pending  Microbiology: Recent Results (from the past 240 hour(s))  MRSA PCR SCREENING     Status: None   Collection Time    02/20/14 12:30 PM      Result Value Ref Range Status   MRSA by PCR NEGATIVE   NEGATIVE Final   Comment:            The GeneXpert MRSA Assay (FDA     approved for NASAL specimens     only), is one component of a     comprehensive MRSA colonization     surveillance program. It is not     intended to diagnose MRSA     infection nor to guide or     monitor treatment for     MRSA infections.     Labs: Results for orders placed during the hospital encounter of 02/19/14 (  from the past 48 hour(s))  CBC WITH DIFFERENTIAL     Status: None   Collection Time    02/19/14  4:17 PM      Result Value Ref Range   WBC 7.5  4.0 - 10.5 K/uL   RBC 4.55  3.87 - 5.11 MIL/uL   Hemoglobin 13.0  12.0 - 15.0 g/dL   HCT 39.6  36.0 - 46.0 %   MCV 87.0  78.0 - 100.0 fL   MCH 28.6  26.0 - 34.0 pg   MCHC 32.8  30.0 - 36.0 g/dL   RDW 13.7  11.5 - 15.5 %   Platelets 287  150 - 400 K/uL   Neutrophils Relative % 60  43 - 77 %   Neutro Abs 4.5  1.7 - 7.7 K/uL   Lymphocytes Relative 33  12 - 46 %   Lymphs Abs 2.4  0.7 - 4.0 K/uL   Monocytes Relative 6  3 - 12 %   Monocytes Absolute 0.4  0.1 - 1.0 K/uL   Eosinophils Relative 1  0 - 5 %   Eosinophils Absolute 0.1  0.0 - 0.7 K/uL   Basophils Relative 0  0 - 1 %   Basophils Absolute 0.0  0.0 - 0.1 K/uL  BASIC METABOLIC PANEL     Status: None   Collection Time    02/19/14  4:17 PM      Result Value Ref Range   Sodium 138  137 - 147 mEq/L   Potassium 4.0  3.7 - 5.3 mEq/L   Chloride 100  96 - 112 mEq/L   CO2 27  19 - 32 mEq/L   Glucose, Bld 88  70 - 99 mg/dL   BUN 7  6 - 23 mg/dL   Creatinine, Ser 0.75  0.50 - 1.10 mg/dL   Calcium 9.4  8.4 - 10.5 mg/dL   GFR calc non Af Amer >90  >90 mL/min   GFR calc Af Amer >90  >90 mL/min   Comment: (NOTE)     The eGFR has been calculated using the CKD EPI equation.     This calculation has not been validated in all clinical situations.     eGFR's persistently <90 mL/min signify possible Chronic Kidney     Disease.   Anion gap 11  5 - 15  I-STAT TROPOININ, ED     Status: None   Collection  Time    02/19/14  4:27 PM      Result Value Ref Range   Troponin i, poc 0.00  0.00 - 0.08 ng/mL   Comment 3            Comment: Due to the release kinetics of cTnI,     a negative result within the first hours     of the onset of symptoms does not rule out     myocardial infarction with certainty.     If myocardial infarction is still suspected,     repeat the test at appropriate intervals.  PRO B NATRIURETIC PEPTIDE     Status: Abnormal   Collection Time    02/19/14  4:43 PM      Result Value Ref Range   Pro B Natriuretic peptide (BNP) 947.6 (*) 0 - 125 pg/mL  HEPATIC FUNCTION PANEL     Status: None   Collection Time    02/19/14  5:00 PM      Result Value Ref Range   Total Protein 7.5  6.0 - 8.3  g/dL   Albumin 3.9  3.5 - 5.2 g/dL   AST 17  0 - 37 U/L   ALT 12  0 - 35 U/L   Alkaline Phosphatase 110  39 - 117 U/L   Total Bilirubin 0.4  0.3 - 1.2 mg/dL   Bilirubin, Direct <0.2  0.0 - 0.3 mg/dL   Indirect Bilirubin NOT CALCULATED  0.3 - 0.9 mg/dL  LIPASE, BLOOD     Status: None   Collection Time    02/19/14  5:00 PM      Result Value Ref Range   Lipase 21  11 - 59 U/L  D-DIMER, QUANTITATIVE     Status: Abnormal   Collection Time    02/19/14  5:00 PM      Result Value Ref Range   D-Dimer, Quant 0.83 (*) 0.00 - 0.48 ug/mL-FEU   Comment:            AT THE INHOUSE ESTABLISHED CUTOFF     VALUE OF 0.48 ug/mL FEU,     THIS ASSAY HAS BEEN DOCUMENTED     IN THE LITERATURE TO HAVE     A SENSITIVITY AND NEGATIVE     PREDICTIVE VALUE OF AT LEAST     98 TO 99%.  THE TEST RESULT     SHOULD BE CORRELATED WITH     AN ASSESSMENT OF THE CLINICAL     PROBABILITY OF DVT / VTE.  Randolm Idol, ED     Status: None   Collection Time    02/19/14  9:29 PM      Result Value Ref Range   Troponin i, poc 0.00  0.00 - 0.08 ng/mL   Comment 3            Comment: Due to the release kinetics of cTnI,     a negative result within the first hours     of the onset of symptoms does not rule out      myocardial infarction with certainty.     If myocardial infarction is still suspected,     repeat the test at appropriate intervals.  TROPONIN I     Status: None   Collection Time    02/20/14  4:05 AM      Result Value Ref Range   Troponin I <0.30  <0.30 ng/mL   Comment:            Due to the release kinetics of cTnI,     a negative result within the first hours     of the onset of symptoms does not rule out     myocardial infarction with certainty.     If myocardial infarction is still suspected,     repeat the test at appropriate intervals.  COMPREHENSIVE METABOLIC PANEL     Status: Abnormal   Collection Time    02/20/14  4:06 AM      Result Value Ref Range   Sodium 138  137 - 147 mEq/L   Potassium 3.7  3.7 - 5.3 mEq/L   Chloride 102  96 - 112 mEq/L   CO2 25  19 - 32 mEq/L   Glucose, Bld 91  70 - 99 mg/dL   BUN 7  6 - 23 mg/dL   Creatinine, Ser 0.76  0.50 - 1.10 mg/dL   Calcium 8.7  8.4 - 10.5 mg/dL   Total Protein 7.0  6.0 - 8.3 g/dL   Albumin 3.4 (*) 3.5 - 5.2 g/dL   AST 44 (*)  0 - 37 U/L   ALT 28  0 - 35 U/L   Alkaline Phosphatase 117  39 - 117 U/L   Total Bilirubin 0.5  0.3 - 1.2 mg/dL   GFR calc non Af Amer >90  >90 mL/min   GFR calc Af Amer >90  >90 mL/min   Comment: (NOTE)     The eGFR has been calculated using the CKD EPI equation.     This calculation has not been validated in all clinical situations.     eGFR's persistently <90 mL/min signify possible Chronic Kidney     Disease.   Anion gap 11  5 - 15  CBC WITH DIFFERENTIAL     Status: Abnormal   Collection Time    02/20/14  4:06 AM      Result Value Ref Range   WBC 6.1  4.0 - 10.5 K/uL   RBC 4.22  3.87 - 5.11 MIL/uL   Hemoglobin 11.7 (*) 12.0 - 15.0 g/dL   HCT 35.8 (*) 36.0 - 46.0 %   MCV 84.8  78.0 - 100.0 fL   MCH 27.7  26.0 - 34.0 pg   MCHC 32.7  30.0 - 36.0 g/dL   RDW 13.6  11.5 - 15.5 %   Platelets 233  150 - 400 K/uL   Neutrophils Relative % 54  43 - 77 %   Neutro Abs 3.3  1.7 - 7.7 K/uL    Lymphocytes Relative 39  12 - 46 %   Lymphs Abs 2.4  0.7 - 4.0 K/uL   Monocytes Relative 6  3 - 12 %   Monocytes Absolute 0.4  0.1 - 1.0 K/uL   Eosinophils Relative 1  0 - 5 %   Eosinophils Absolute 0.1  0.0 - 0.7 K/uL   Basophils Relative 0  0 - 1 %   Basophils Absolute 0.0  0.0 - 0.1 K/uL  TROPONIN I     Status: None   Collection Time    02/20/14 11:39 AM      Result Value Ref Range   Troponin I <0.30  <0.30 ng/mL   Comment:            Due to the release kinetics of cTnI,     a negative result within the first hours     of the onset of symptoms does not rule out     myocardial infarction with certainty.     If myocardial infarction is still suspected,     repeat the test at appropriate intervals.  MRSA PCR SCREENING     Status: None   Collection Time    02/20/14 12:30 PM      Result Value Ref Range   MRSA by PCR NEGATIVE  NEGATIVE   Comment:            The GeneXpert MRSA Assay (FDA     approved for NASAL specimens     only), is one component of a     comprehensive MRSA colonization     surveillance program. It is not     intended to diagnose MRSA     infection nor to guide or     monitor treatment for     MRSA infections.  TSH     Status: None   Collection Time    02/20/14  3:10 PM      Result Value Ref Range   TSH 2.280  0.350 - 4.500 uIU/mL  PRO B NATRIURETIC PEPTIDE  Status: Abnormal   Collection Time    02/20/14  3:10 PM      Result Value Ref Range   Pro B Natriuretic peptide (BNP) 933.6 (*) 0 - 125 pg/mL  T4, FREE     Status: None   Collection Time    02/20/14  3:10 PM      Result Value Ref Range   Free T4 1.09  0.80 - 1.80 ng/dL   Comment: Performed at [facility] ([place])     Status: Abnormal   Collection Time    02/20/14  5:52 PM      Result Value Ref Range   Opiates POSITIVE (*) NONE DETECTED   Cocaine NONE DETECTED  NONE DETECTED   Benzodiazepines NONE DETECTED  NONE DETECTED   Amphetamines NONE  DETECTED  NONE DETECTED   Tetrahydrocannabinol NONE DETECTED  NONE DETECTED   Barbiturates NONE DETECTED  NONE DETECTED   Comment:            DRUG SCREEN FOR MEDICAL PURPOSES     ONLY.  IF CONFIRMATION IS NEEDED     FOR ANY PURPOSE, NOTIFY LAB     WITHIN 5 DAYS.                LOWEST DETECTABLE LIMITS     FOR URINE DRUG SCREEN     Drug Class       Cutoff (ng/mL)     Amphetamine      1000     Barbiturate      200     Benzodiazepine   300     Tricyclics       923     Opiates          300     Cocaine          300     THC              50  PREGNANCY, URINE     Status: None   Collection Time    02/20/14  5:52 PM      Result Value Ref Range   Preg Test, Ur NEGATIVE  NEGATIVE   Comment:            THE SENSITIVITY OF THIS     METHODOLOGY IS >20 mIU/mL.  TROPONIN I     Status: None   Collection Time    02/20/14  6:10 PM      Result Value Ref Range   Troponin I <0.30  <0.30 ng/mL   Comment:            Due to the release kinetics of cTnI,     a negative result within the first hours     of the onset of symptoms does not rule out     myocardial infarction with certainty.     If myocardial infarction is still suspected,     repeat the test at appropriate intervals.  CBC     Status: Abnormal   Collection Time    02/21/14  4:05 AM      Result Value Ref Range   WBC 6.3  4.0 - 10.5 K/uL   RBC 4.23  3.87 - 5.11 MIL/uL   Hemoglobin 11.7 (*) 12.0 - 15.0 g/dL   HCT 36.2  36.0 - 46.0 %   MCV 85.6  78.0 - 100.0 fL   MCH 27.7  26.0 - 34.0 pg   MCHC 32.3  30.0 -  36.0 g/dL   RDW 13.8  11.5 - 15.5 %   Platelets 259  150 - 400 K/uL  COMPREHENSIVE METABOLIC PANEL     Status: Abnormal   Collection Time    02/21/14  4:05 AM      Result Value Ref Range   Sodium 138  137 - 147 mEq/L   Potassium 4.8  3.7 - 5.3 mEq/L   Comment: DELTA CHECK NOTED   Chloride 102  96 - 112 mEq/L   CO2 25  19 - 32 mEq/L   Glucose, Bld 102 (*) 70 - 99 mg/dL   BUN 14  6 - 23 mg/dL   Creatinine, Ser 1.16 (*) 0.50  - 1.10 mg/dL   Comment: DELTA CHECK NOTED   Calcium 9.1  8.4 - 10.5 mg/dL   Total Protein 7.2  6.0 - 8.3 g/dL   Albumin 3.4 (*) 3.5 - 5.2 g/dL   AST 20  0 - 37 U/L   ALT 20  0 - 35 U/L   Alkaline Phosphatase 113  39 - 117 U/L   Total Bilirubin <0.2 (*) 0.3 - 1.2 mg/dL   GFR calc non Af Amer 59 (*) >90 mL/min   GFR calc Af Amer 68 (*) >90 mL/min   Comment: (NOTE)     The eGFR has been calculated using the CKD EPI equation.     This calculation has not been validated in all clinical situations.     eGFR's persistently <90 mL/min signify possible Chronic Kidney     Disease.   Anion gap 11  5 - 15     HPI : 38 yo woman with a PMH of hypertension, prior DVT ('14) and postpartum cardiomyopathy dating back 15 years with 6/15 EF of 35-40% with restrictive filling who presents from cardiac rehabilitation for epigastric chest discomfort. She's had the symptoms for 10 hours and initially the pain initially woke her from sleep. She has no associated shortness of breath. During cardiac rehabilitation the chest/epipastrium discomfort was slightly worse leading to ER transfer/presentation. Initial troponin negative and d-dimer elevated. She tells me she felt worse and then better after the GI cocktail. The pain has improved mildly since this AM but been mainly persistent. She was initially in atrial fibrillation and now in sinus rhythm.    HOSPITAL COURSE:  Chest pain Atypical symptoms, low suspicion for acute pericarditis Telemetry negative,Cardiac rehab strips reviewed and only showed NSR/ST with occ PVCs, TSH normal, no documentation of paroxysmal atrial fibrillation found VQ scan low probability Likely secondary to esophageal/GI etiology 2 day stress test due to morbid obesity, results pending 2-D echo done results pending Discharge home today if stress is negative  Questionable paroxysmal atrial fibrillation May need an outpatient event monitor Followup with cardiology  Peripartum  cardiomyopathy with systolic heart failure  Repeat 2-D echo done and pending - on spironolactone 25 mg daily, coreg 25 mg bid, losartan 50 mg daily, lasix 80 mg po bid    Elevated d-dimer: V/Q negative  Hypertension: continue HF medications as above     Discharge Exam:  Blood pressure 106/75, pulse 79, temperature 98.7 F (37.1 C), temperature source Oral, resp. rate 20, height 5' 5"  (1.651 m), weight 112.6 kg (248 lb 3.8 oz), last menstrual period 02/18/2014, SpO2 97.00%.  Mouth/Throat: Oropharynx is clear and moist. No oropharyngeal exudate.  Eyes: Conjunctivae and EOM are normal. Pupils are equal, round, and reactive to light. No scleral icterus.  Neck: Normal range of motion. Neck supple. JVD present.  No tracheal deviation present.  JVP 2 cm above clavicle at 90 degrees with slight HJR  Cardiovascular: Normal rate, regular rhythm, normal heart sounds and intact distal pulses. Exam reveals no gallop.  No murmur heard.  Respiratory: Effort normal and breath sounds normal. No respiratory distress. She has no wheezes. She has no rales.  GI: Soft. Bowel sounds are normal. She exhibits no distension. There is no tenderness. There is no rebound.  Musculoskeletal: Normal range of motion. She exhibits no edema and no tenderness.  Neurological: She is alert and oriented to person, place, and time. She has normal reflexes. No cranial nerve deficit. Coordination normal.         Signed: Zoraida Havrilla 02/21/2014, 10:12 AM

## 2014-02-21 NOTE — Progress Notes (Signed)
    Subjective:  Denies CP or dyspnea; epigastric pain earlier   Objective:  Filed Vitals:   02/21/14 0000 02/21/14 0100 02/21/14 0406 02/21/14 0750  BP: 68/36 82/42 104/54 106/75  Pulse:   100 79  Temp: 97.8 F (36.6 C)  98.5 F (36.9 C) 98.7 F (37.1 C)  TempSrc: Oral  Oral Oral  Resp:      Height:      Weight:   248 lb 3.8 oz (112.6 kg)   SpO2: 96%  98% 97%    Intake/Output from previous day: No intake or output data in the 24 hours ending 02/21/14 1145  Physical Exam: Physical exam: Well-developed obese in no acute distress.  Skin is warm and dry.  HEENT is normal.  Neck is supple.  Chest is clear to auscultation with normal expansion.  Cardiovascular exam is regular rate and rhythm.  Abdominal exam nontender or distended. No masses palpated. Extremities show no edema. neuro grossly intact    Lab Results: Basic Metabolic Panel:  Recent Labs  02/20/14 0406 02/21/14 0405  NA 138 138  K 3.7 4.8  CL 102 102  CO2 25 25  GLUCOSE 91 102*  BUN 7 14  CREATININE 0.76 1.16*  CALCIUM 8.7 9.1   CBC:  Recent Labs  02/19/14 1617 02/20/14 0406 02/21/14 0405  WBC 7.5 6.1 6.3  NEUTROABS 4.5 3.3  --   HGB 13.0 11.7* 11.7*  HCT 39.6 35.8* 36.2  MCV 87.0 84.8 85.6  PLT 287 233 259   Cardiac Enzymes:  Recent Labs  02/20/14 0405 02/20/14 1139 02/20/14 1810  TROPONINI <0.30 <0.30 <0.30     Assessment/Plan:  38 y/o F with hypertension, prior DVT ('14) and postpartum cardiomyopathy dating back 15 years with 6/15 EF of 35-40% with restrictive filling who presents from cardiac rehabilitation for epigastric chest discomfort.  1. Chest pain-Symptoms unlikely to be cardiac based on description. DC ASA - r/o for MI, VQ scan negative for PE  - Echocardiogram shows ejection fraction 35-40% unchanged. - Final results of nuclear study pending. 2. 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 Sinus tachycardia    - TSH normal - there is mention of "brief atrial fibrillation" in fellow note - cannot find any evidence of this on ECGs or rhythm strips 4. Hypertension  - continue HF medications as above; transient decrease BP last PM but no other episodes of hypotension. Follow BP as outpt.  If nuclear study negative, patient can be DCed from a cardiac standpoint.  Kirk Ruths 02/21/2014, 11:45 AM

## 2014-02-21 NOTE — Progress Notes (Signed)
lexiscan myoview completed without complications.  Mild SOB, nuc results to follow.

## 2014-02-21 NOTE — Progress Notes (Signed)
Pt refuses CPAP at this time. RT will continue to monitor.  

## 2014-02-21 NOTE — Progress Notes (Signed)
  Echocardiogram 2D Echocardiogram has been performed.  [name] 02/21/2014, 9:22 AM

## 2014-02-22 DIAGNOSIS — R079 Chest pain, unspecified: Secondary | ICD-10-CM

## 2014-02-22 MED ORDER — TECHNETIUM TC 99M SESTAMIBI GENERIC - CARDIOLITE
30.0000 | Freq: Once | INTRAVENOUS | Status: AC | PRN
  Administered 2014-02-22: 30 via INTRAVENOUS

## 2014-02-22 NOTE — Progress Notes (Signed)
Results of stress test still pending.

## 2014-02-22 NOTE — Discharge Summary (Addendum)
Physician Discharge Summary  [name]  MRN: [id number]  DOB/AGE: 11/25/1975 38 y.o.  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    colesevelam 625 MG tablet    Commonly known as: WELCHOL    TAKE 1 TABLET BY MOUTH EVERY DAY    cyclobenzaprine 10 MG tablet    Commonly known as: FLEXERIL    Take 1 tablet (10 mg total) by mouth 3 (three) times daily as needed for muscle spasms.    diazepam 10 MG tablet    Commonly known as: VALIUM    Take 10 mg by mouth at bedtime as needed for anxiety.    dicyclomine 10 MG capsule    Commonly known as: BENTYL    Take 10 mg by mouth 3 (three) times daily as needed for spasms.    eucerin cream    Apply 1 application topically as needed for dry skin.    fluticasone 50 MCG/ACT nasal spray    Commonly known as: FLONASE    Place 1 spray into both nostrils at bedtime. Before donning CPAP    furosemide 40 MG tablet    Commonly known as: LASIX    Take 2 tablets (80 mg total) by mouth 2 (two) times daily.    gabapentin 300 MG capsule    Commonly known as: NEURONTIN    Take 300 mg by mouth at bedtime. Daily at bedtime.    gabapentin 100 MG capsule    Commonly known as: NEURONTIN    Take 1 capsule (100 mg total) by mouth 2 (two) times daily.    losartan 50 MG tablet    Commonly known as: COZAAR    Take 1 tablet (50 mg total) by mouth daily.    NUVIGIL 250 MG tablet    Generic drug: Armodafinil    Take  250 mg by mouth daily as needed (when using CPAP and still short of breath).    pantoprazole 20 MG tablet    Commonly known as: PROTONIX    Take 1 tablet (20 mg total) by mouth 2 (two) times daily.    potassium chloride SA 20 MEQ tablet    Commonly known as: K-DUR,KLOR-CON    Take 2 tablets (40 mEq total) by mouth 2 (two) times daily.    PRENATAL 1 + IRON PO    Take 1 tablet by mouth.    spironolactone 25 MG tablet    Commonly known as: ALDACTONE    Take 1 tablet (25 mg total) by mouth daily.    sucralfate 1 G tablet    Commonly known as: CARAFATE    Take 1 g by mouth daily.    VIIBRYD 10 MG Tabs    Generic drug: Vilazodone HCl    Take 10 mg by mouth daily as  needed (depressioni).    Vitamin D (Ergocalciferol) 50000 UNITS Caps capsule    Commonly known as: DRISDOL    Take 50,000 Units by mouth every 7 (seven) days.      Discharge Condition:  Disposition: 01-Home or Self Care  Consults: Cardiology  Significant Diagnostic Studies:  Dg Chest 2 View  02/19/2014 CLINICAL DATA: Central chest pain for 1 day, history hypertension, CHF, DVT EXAM: CHEST 2 VIEW COMPARISON: 10/03/2013 FINDINGS: Enlargement of cardiac silhouette. Mediastinal contours and pulmonary vascularity normal. Lungs clear. Slight elevation RIGHT diaphragm unchanged. No pleural effusion or pneumothorax. Bones unremarkable. IMPRESSION: Enlargement of cardiac silhouette. No acute abnormalities. Electronically Signed By: Lavonia Dana M.D. On: 02/19/2014 17:09  US Abdomen Complete  02/20/2014 CLINICAL DATA: Epigastric pain with nausea for 2 days. History of cholecystectomy and biliary stenting. EXAM: ULTRASOUND ABDOMEN COMPLETE COMPARISON: Abdominal pelvic CT 07/19/2013. FINDINGS: Examination is mildly limited by body habitus. Gallbladder: Status post cholecystectomy. Common bile duct: Diameter: 8.6 mm. This appears increased from prior CT. No intraductal stones demonstrated. Liver: Mildly increased echogenicity. No focal abnormality  apparent. IVC: No abnormality visualized. Pancreas: Visualized portion unremarkable. Spleen: Size and appearance within normal limits. Right Kidney: Length: 12.8 cm. There is a probable small cyst in the interpolar region measuring 8 mm maximally. No suspicious cortical lesion or hydronephrosis. Left Kidney: Length: 11.8 cm. Echogenicity within normal limits. No mass or hydronephrosis visualized. Abdominal aorta: No aneurysm visualized. Other findings: None. IMPRESSION: 1. Mild extrahepatic biliary dilatation status post cholecystectomy. Although increased from prior CT, laboratory tests today do not show evidence of biliary obstruction. 2. Probable mild hepatic steatosis. Electronically Signed By: Camie Patience M.D. On: 02/20/2014 19:23  Nm Pulmonary Perf And Vent  02/19/2014 CLINICAL DATA: Chest pain, tachycardia, elevated D-dimer. EXAM: NUCLEAR MEDICINE VENTILATION - PERFUSION LUNG SCAN TECHNIQUE: Ventilation images were obtained in multiple projections using inhaled aerosol technetium 99 M DTPA. Perfusion images were obtained in multiple projections after intravenous injection of Tc-75mMAA. RADIOPHARMACEUTICALS: 40 mCi Tc-94mTPA aerosol and 6 mCi Tc-9967mA COMPARISON: Chest 02/19/2014, V/Q scan 12/30/2012. FINDINGS: Ventilation: Mostly homogeneous distribution of tracer activity throughout the lungs without focal defect. Tracer activity also identified in the gastrointestinal tract. Perfusion: No wedge shaped peripheral perfusion defects to suggest acute pulmonary embolism. IMPRESSION: Very low probability of pulmonary embolus. Electronically Signed By: WilLucienne CapersD. On: 02/19/2014 23:31    2-D echo results No significant change when compared to the study from 10/25/2013. There is now grade 2 diastolic dysfunction (previosult grade 3).  Microbiology:  Recent Results (from the past 240 hour(s))   MRSA PCR SCREENING Status: None    Collection Time    02/20/14 12:30 PM   Result  Value  Ref Range   Status    MRSA by PCR  NEGATIVE  NEGATIVE  Final    Comment:      The GeneXpert MRSA Assay (FDA     approved for NASAL specimens     only), is one component of a     comprehensive MRSA colonization     surveillance program. It is not     intended to diagnose MRSA     infection nor to guide or     monitor treatment for     MRSA infections.    Labs:  Results for orders placed during the hospital encounter of 02/19/14 (from the past 48 hour(s))   CBC WITH DIFFERENTIAL Status: None    Collection Time    02/19/14 4:17 PM   Result  Value  Ref Range    WBC  7.5  4.0 - 10.5 K/uL    RBC  4.55  3.87 - 5.11 MIL/uL    Hemoglobin  13.0  12.0 - 15.0 g/dL    HCT  39.6  36.0 - 46.0 %    MCV  87.0  78.0 - 100.0 fL    MCH  28.6  26.0 - 34.0 pg    MCHC  32.8  30.0 - 36.0 g/dL    RDW  13.7  11.5 - 15.5 %    Platelets  287  150 - 400 K/uL    Neutrophils Relative %  60  43 - 77 %    Neutro Abs  4.5  1.7 - 7.7 K/uL    Lymphocytes Relative  33  12 - 46 %    Lymphs Abs  2.4  0.7 - 4.0 K/uL    Monocytes Relative  6  3 - 12 %    Monocytes Absolute  0.4  0.1 - 1.0 K/uL    Eosinophils Relative  1  0 - 5 %    Eosinophils Absolute  0.1  0.0 - 0.7 K/uL    Basophils Relative  0  0 - 1 %    Basophils Absolute  0.0  0.0 - 0.1 K/uL   BASIC METABOLIC PANEL Status: None    Collection Time    02/19/14 4:17 PM   Result  Value  Ref Range    Sodium  138  137 - 147 mEq/L    Potassium  4.0  3.7 - 5.3 mEq/L    Chloride  100  96 - 112 mEq/L    CO2  27  19 - 32 mEq/L    Glucose, Bld  88  70 - 99 mg/dL    BUN  7  6 - 23 mg/dL    Creatinine, Ser  0.75  0.50 - 1.10 mg/dL    Calcium  9.4  8.4 - 10.5 mg/dL    GFR calc non Af Amer  >90  >90 mL/min    GFR calc Af Amer  >90  >90 mL/min    Comment:  (NOTE)     The eGFR has been calculated using the CKD EPI equation.     This calculation has not been validated in all clinical situations.     eGFR's persistently <90 mL/min signify possible Chronic Kidney     Disease.     Anion gap  11  5 - 15   I-STAT TROPOININ, ED Status: None    Collection Time    02/19/14 4:27 PM   Result  Value  Ref Range    Troponin i, poc  0.00  0.00 - 0.08 ng/mL    Comment 3      Comment:  Due to the release kinetics of cTnI,     a negative result within the first hours     of the onset of symptoms does not rule out     myocardial infarction with certainty.     If myocardial infarction is still suspected,     repeat the test at appropriate intervals.   PRO B NATRIURETIC PEPTIDE Status: Abnormal    Collection Time    02/19/14 4:43 PM   Result  Value  Ref Range    Pro B Natriuretic peptide (BNP)  947.6 (*)  0 - 125 pg/mL   HEPATIC FUNCTION PANEL Status: None    Collection Time    02/19/14 5:00 PM  Result  Value  Ref Range    Total Protein  7.5  6.0 - 8.3 g/dL    Albumin  3.9  3.5 - 5.2 g/dL    AST  17  0 - 37 U/L    ALT  12  0 - 35 U/L    Alkaline Phosphatase  110  39 - 117 U/L    Total Bilirubin  0.4  0.3 - 1.2 mg/dL    Bilirubin, Direct  <0.2  0.0 - 0.3 mg/dL    Indirect Bilirubin  NOT CALCULATED  0.3 - 0.9 mg/dL   LIPASE, BLOOD Status: None    Collection Time    02/19/14 5:00 PM   Result  Value  Ref Range    Lipase  21  11 - 59 U/L   D-DIMER, QUANTITATIVE Status: Abnormal    Collection Time    02/19/14 5:00 PM   Result  Value  Ref Range    D-Dimer, Quant  0.83 (*)  0.00 - 0.48 ug/mL-FEU    Comment:      AT THE INHOUSE ESTABLISHED CUTOFF     VALUE OF 0.48 ug/mL FEU,     THIS ASSAY HAS BEEN DOCUMENTED     IN THE LITERATURE TO HAVE     A SENSITIVITY AND NEGATIVE     PREDICTIVE VALUE OF AT LEAST     98 TO 99%. THE TEST RESULT     SHOULD BE CORRELATED WITH     AN ASSESSMENT OF THE CLINICAL     PROBABILITY OF DVT / VTE.   Randolm Idol, ED Status: None    Collection Time    02/19/14 9:29 PM   Result  Value  Ref Range    Troponin i, poc  0.00  0.00 - 0.08 ng/mL    Comment 3      Comment:  Due to the release kinetics of cTnI,     a negative result  within the first hours     of the onset of symptoms does not rule out     myocardial infarction with certainty.     If myocardial infarction is still suspected,     repeat the test at appropriate intervals.   TROPONIN I Status: None    Collection Time    02/20/14 4:05 AM   Result  Value  Ref Range    Troponin I  <0.30  <0.30 ng/mL    Comment:      Due to the release kinetics of cTnI,     a negative result within the first hours     of the onset of symptoms does not rule out     myocardial infarction with certainty.     If myocardial infarction is still suspected,     repeat the test at appropriate intervals.   COMPREHENSIVE METABOLIC PANEL Status: Abnormal    Collection Time    02/20/14 4:06 AM   Result  Value  Ref Range    Sodium  138  137 - 147 mEq/L    Potassium  3.7  3.7 - 5.3 mEq/L    Chloride  102  96 - 112 mEq/L    CO2  25  19 - 32 mEq/L    Glucose, Bld  91  70 - 99 mg/dL    BUN  7  6 - 23 mg/dL    Creatinine, Ser  0.76  0.50 - 1.10 mg/dL    Calcium  8.7  8.4 - 10.5 mg/dL  Total Protein  7.0  6.0 - 8.3 g/dL    Albumin  3.4 (*)  3.5 - 5.2 g/dL    AST  44 (*)  0 - 37 U/L    ALT  28  0 - 35 U/L    Alkaline Phosphatase  117  39 - 117 U/L    Total Bilirubin  0.5  0.3 - 1.2 mg/dL    GFR calc non Af Amer  >90  >90 mL/min    GFR calc Af Amer  >90  >90 mL/min    Comment:  (NOTE)     The eGFR has been calculated using the CKD EPI equation.     This calculation has not been validated in all clinical situations.     eGFR's persistently <90 mL/min signify possible Chronic Kidney     Disease.    Anion gap  11  5 - 15   CBC WITH DIFFERENTIAL Status: Abnormal    Collection Time    02/20/14 4:06 AM   Result  Value  Ref Range    WBC  6.1  4.0 - 10.5 K/uL    RBC  4.22  3.87 - 5.11 MIL/uL    Hemoglobin  11.7 (*)  12.0 - 15.0 g/dL    HCT  35.8 (*)  36.0 - 46.0 %    MCV  84.8  78.0 - 100.0 fL    MCH  27.7  26.0 - 34.0 pg    MCHC  32.7  30.0 - 36.0 g/dL    RDW  13.6  11.5 -  15.5 %    Platelets  233  150 - 400 K/uL    Neutrophils Relative %  54  43 - 77 %    Neutro Abs  3.3  1.7 - 7.7 K/uL    Lymphocytes Relative  39  12 - 46 %    Lymphs Abs  2.4  0.7 - 4.0 K/uL    Monocytes Relative  6  3 - 12 %    Monocytes Absolute  0.4  0.1 - 1.0 K/uL    Eosinophils Relative  1  0 - 5 %    Eosinophils Absolute  0.1  0.0 - 0.7 K/uL    Basophils Relative  0  0 - 1 %    Basophils Absolute  0.0  0.0 - 0.1 K/uL   TROPONIN I Status: None    Collection Time    02/20/14 11:39 AM   Result  Value  Ref Range    Troponin I  <0.30  <0.30 ng/mL    Comment:      Due to the release kinetics of cTnI,     a negative result within the first hours     of the onset of symptoms does not rule out     myocardial infarction with certainty.     If myocardial infarction is still suspected,     repeat the test at appropriate intervals.   MRSA PCR SCREENING Status: None    Collection Time    02/20/14 12:30 PM   Result  Value  Ref Range    MRSA by PCR  NEGATIVE  NEGATIVE    Comment:      The GeneXpert MRSA Assay (FDA     approved for NASAL specimens     only), is one component of a     comprehensive MRSA colonization     surveillance program. It is not     intended to diagnose MRSA  infection nor to guide or     monitor treatment for     MRSA infections.   TSH Status: None    Collection Time    02/20/14 3:10 PM   Result  Value  Ref Range    TSH  2.280  0.350 - 4.500 uIU/mL   PRO B NATRIURETIC PEPTIDE Status: Abnormal    Collection Time    02/20/14 3:10 PM   Result  Value  Ref Range    Pro B Natriuretic peptide (BNP)  933.6 (*)  0 - 125 pg/mL   T4, FREE Status: None    Collection Time    02/20/14 3:10 PM   Result  Value  Ref Range    Free T4  1.09  0.80 - 1.80 ng/dL    Comment:  Performed at [facility] ([place]) Status: Abnormal    Collection Time    02/20/14 5:52 PM   Result  Value  Ref Range    Opiates  POSITIVE (*)  NONE  DETECTED    Cocaine  NONE DETECTED  NONE DETECTED    Benzodiazepines  NONE DETECTED  NONE DETECTED    Amphetamines  NONE DETECTED  NONE DETECTED    Tetrahydrocannabinol  NONE DETECTED  NONE DETECTED    Barbiturates  NONE DETECTED  NONE DETECTED    Comment:      DRUG SCREEN FOR MEDICAL PURPOSES     ONLY. IF CONFIRMATION IS NEEDED     FOR ANY PURPOSE, NOTIFY LAB     WITHIN 5 DAYS.         LOWEST DETECTABLE LIMITS     FOR URINE DRUG SCREEN     Drug Class Cutoff (ng/mL)     Amphetamine 1000     Barbiturate 200     Benzodiazepine 742     Tricyclics 595     Opiates 300     Cocaine 300     THC 50   PREGNANCY, URINE Status: None    Collection Time    02/20/14 5:52 PM   Result  Value  Ref Range    Preg Test, Ur  NEGATIVE  NEGATIVE    Comment:      THE SENSITIVITY OF THIS     METHODOLOGY IS >20 mIU/mL.   TROPONIN I Status: None    Collection Time    02/20/14 6:10 PM   Result  Value  Ref Range    Troponin I  <0.30  <0.30 ng/mL    Comment:      Due to the release kinetics of cTnI,     a negative result within the first hours     of the onset of symptoms does not rule out     myocardial infarction with certainty.     If myocardial infarction is still suspected,     repeat the test at appropriate intervals.   CBC Status: Abnormal    Collection Time    02/21/14 4:05 AM   Result  Value  Ref Range    WBC  6.3  4.0 - 10.5 K/uL    RBC  4.23  3.87 - 5.11 MIL/uL    Hemoglobin  11.7 (*)  12.0 - 15.0 g/dL    HCT  36.2  36.0 - 46.0 %    MCV  85.6  78.0 - 100.0 fL    MCH  27.7  26.0 - 34.0 pg    MCHC  32.3  30.0 -  36.0 g/dL    RDW  13.8  11.5 - 15.5 %    Platelets  259  150 - 400 K/uL   COMPREHENSIVE METABOLIC PANEL Status: Abnormal    Collection Time    02/21/14 4:05 AM   Result  Value  Ref Range    Sodium  138  137 - 147 mEq/L    Potassium  4.8  3.7 - 5.3 mEq/L    Comment:  DELTA CHECK NOTED    Chloride  102  96 - 112 mEq/L    CO2  25  19 - 32 mEq/L    Glucose, Bld  102 (*)   70 - 99 mg/dL    BUN  14  6 - 23 mg/dL    Creatinine, Ser  1.16 (*)  0.50 - 1.10 mg/dL    Comment:  DELTA CHECK NOTED    Calcium  9.1  8.4 - 10.5 mg/dL    Total Protein  7.2  6.0 - 8.3 g/dL    Albumin  3.4 (*)  3.5 - 5.2 g/dL    AST  20  0 - 37 U/L    ALT  20  0 - 35 U/L    Alkaline Phosphatase  113  39 - 117 U/L    Total Bilirubin  <0.2 (*)  0.3 - 1.2 mg/dL    GFR calc non Af Amer  59 (*)  >90 mL/min    GFR calc Af Amer  68 (*)  >90 mL/min    Comment:  (NOTE)     The eGFR has been calculated using the CKD EPI equation.     This calculation has not been validated in all clinical situations.     eGFR's persistently <90 mL/min signify possible Chronic Kidney     Disease.    Anion gap  11  5 - 15    HPI :  38 yo woman with a PMH of hypertension, prior DVT ('14) and postpartum cardiomyopathy dating back 15 years with 6/15 EF of 35-40% with restrictive filling who presents from cardiac rehabilitation for epigastric chest discomfort. She's had the symptoms for 10 hours and initially the pain initially woke her from sleep. She has no associated shortness of breath. During cardiac rehabilitation the chest/epipastrium discomfort was slightly worse leading to ER transfer/presentation. Initial troponin negative and d-dimer elevated. She tells me she felt worse and then better after the GI cocktail. The pain has improved mildly since this AM but been mainly persistent. She was initially in atrial fibrillation and now in sinus rhythm.   HOSPITAL COURSE:  Chest pain  Atypical symptoms, low suspicion for acute pericarditis  Telemetry negative,Cardiac rehab strips reviewed and only showed NSR/ST with occ PVCs, TSH normal, no documentation of paroxysmal atrial fibrillation found  VQ scan low probability  Likely secondary to esophageal/GI etiology  2 day stress test due to morbid obesity, results pending  Echocardiogram shows ejection fraction 35-40% unchanged.  - Final results of nuclear study pending  (rest images to be done today Discharge home today if stress is negative   Questionable paroxysmal atrial fibrillation  May need an outpatient event monitor  Followup with cardiology   Peripartum cardiomyopathy with systolic heart failure  Repeat 2-D echo done as above - on spironolactone 25 mg daily, coreg 25 mg bid, losartan 50 mg daily, lasix 80 mg po bid   Elevated d-dimer: V/Q negative   Hypertension: continue HF medications as above    Discharge Exam:  Blood pressure 106/75,  pulse 79, temperature 98.7 F (37.1 C), temperature source Oral, resp. rate 20, height _0  (1.651 m), weight 112.6 kg (248 lb 3.8 oz), last menstrual period 02/18/2014, SpO2 97.00%.  Well-developed obese in no acute distress.  Skin is warm and dry.  HEENT is normal.  Neck is supple.  Chest is clear to auscultation with normal expansion.  Cardiovascular exam is regular rate and rhythm.  Abdominal exam nontender or distended. No masses palpated.  Extremities show no edema.  neuro grossly intact

## 2014-02-22 NOTE — Progress Notes (Signed)
    Subjective:  Denies CP or dyspnea   Objective:  Filed Vitals:   02/22/14 0042 02/22/14 0453 02/22/14 0457 02/22/14 0839  BP: 101/37 75/46 96/54  113/53  Pulse:    81  Temp: 97.8 F (36.6 C)  98 F (36.7 C) 97.8 F (36.6 C)  TempSrc:   Oral Oral  Resp:      Height:      Weight:   257 lb 15 oz (117 kg)   SpO2: 97%  95% 95%    Intake/Output from previous day:  Intake/Output Summary (Last 24 hours) at 02/22/14 1009 Last data filed at 02/22/14 0900  Gross per 24 hour  Intake    560 ml  Output    800 ml  Net   -240 ml    Physical Exam: Physical exam: Well-developed obese in no acute distress.  Skin is warm and dry.  HEENT is normal.  Neck is supple.  Chest is clear to auscultation with normal expansion.  Cardiovascular exam is regular rate and rhythm.  Abdominal exam nontender or distended. No masses palpated. Extremities show no edema. neuro grossly intact    Lab Results: Basic Metabolic Panel:  Recent Labs  02/20/14 0406 02/21/14 0405  NA 138 138  K 3.7 4.8  CL 102 102  CO2 25 25  GLUCOSE 91 102*  BUN 7 14  CREATININE 0.76 1.16*  CALCIUM 8.7 9.1   CBC:  Recent Labs  02/19/14 1617 02/20/14 0406 02/21/14 0405  WBC 7.5 6.1 6.3  NEUTROABS 4.5 3.3  --   HGB 13.0 11.7* 11.7*  HCT 39.6 35.8* 36.2  MCV 87.0 84.8 85.6  PLT 287 233 259   Cardiac Enzymes:  Recent Labs  02/20/14 0405 02/20/14 1139 02/20/14 1810  TROPONINI <0.30 <0.30 <0.30     Assessment/Plan:  38 y/o F with hypertension, prior DVT ('14) and postpartum cardiomyopathy dating back 15 years with 6/15 EF of 35-40% with restrictive filling who presents from cardiac rehabilitation for epigastric chest discomfort.  1. Chest pain-Symptoms unlikely to be cardiac based on description. - r/o for MI, VQ scan negative for PE  - Echocardiogram shows ejection fraction 35-40% unchanged. - Final results of nuclear study pending (rest images to be done today). 2. Peripartum cardiomyopathy  15 y/a with chronic systolic heart failure  - continue home regimen; LV function unchanged on echo. 3. Sinus tachycardia  - TSH normal - there is mention of "brief atrial fibrillation" in fellow note - cannot find any evidence of this on ECGs or rhythm strips 4. Hypertension  - continue HF medications as above  If nuclear study negative, patient can be DCed from a cardiac standpoint. She may return to cardiac rehab immediately if nuclear study negative. Kirk Ruths 02/22/2014, 10:09 AM

## 2014-02-22 NOTE — Progress Notes (Signed)
Result of stress test still pending,  Dr.  Allyson Sabal made aware and claimed to d/c her home tom. If the result is negative. Pt . Made aware.

## 2014-02-23 DIAGNOSIS — G4733 Obstructive sleep apnea (adult) (pediatric): Secondary | ICD-10-CM | POA: Diagnosis present

## 2014-02-23 DIAGNOSIS — E875 Hyperkalemia: Secondary | ICD-10-CM | POA: Diagnosis not present

## 2014-02-23 DIAGNOSIS — I10 Essential (primary) hypertension: Secondary | ICD-10-CM | POA: Diagnosis present

## 2014-02-23 DIAGNOSIS — Z6841 Body Mass Index (BMI) 40.0 and over, adult: Secondary | ICD-10-CM | POA: Diagnosis not present

## 2014-02-23 DIAGNOSIS — I959 Hypotension, unspecified: Secondary | ICD-10-CM | POA: Diagnosis not present

## 2014-02-23 DIAGNOSIS — R0789 Other chest pain: Secondary | ICD-10-CM | POA: Diagnosis present

## 2014-02-23 DIAGNOSIS — Z86718 Personal history of other venous thrombosis and embolism: Secondary | ICD-10-CM | POA: Diagnosis not present

## 2014-02-23 DIAGNOSIS — G4700 Insomnia, unspecified: Secondary | ICD-10-CM | POA: Diagnosis present

## 2014-02-23 DIAGNOSIS — Z8249 Family history of ischemic heart disease and other diseases of the circulatory system: Secondary | ICD-10-CM | POA: Diagnosis not present

## 2014-02-23 DIAGNOSIS — O903 Peripartum cardiomyopathy: Secondary | ICD-10-CM | POA: Diagnosis present

## 2014-02-23 DIAGNOSIS — Z7982 Long term (current) use of aspirin: Secondary | ICD-10-CM | POA: Diagnosis not present

## 2014-02-23 DIAGNOSIS — Z91041 Radiographic dye allergy status: Secondary | ICD-10-CM | POA: Diagnosis not present

## 2014-02-23 DIAGNOSIS — I4891 Unspecified atrial fibrillation: Secondary | ICD-10-CM | POA: Diagnosis not present

## 2014-02-23 DIAGNOSIS — I5022 Chronic systolic (congestive) heart failure: Secondary | ICD-10-CM | POA: Diagnosis present

## 2014-02-23 NOTE — Progress Notes (Addendum)
No change in plan; see note from 10/1. If nuclear study negative, patient can be DCed from a cardiac standpoint. She may return to cardiac rehab immediately if nuclear study negative.  [name] Nuclear study shows anterior ischemia; will need cath on Monday and premedication for dye allergy. [name]

## 2014-02-23 NOTE — Progress Notes (Addendum)
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 She's had the symptoms for 10 hours and initially the pain initially woke her from sleep. She has no associated shortness of breath. During cardiac rehabilitation the chest/epipastrium discomfort was slightly worse leading to ER transfer/presentation. Initial troponin negative and d-dimer elevated. She tells me she felt worse and then better after the GI cocktail. The pain has improved mildly since this AM but been mainly persistent. She was initially in atrial fibrillation and now in sinus rhythm.    HOSPITAL COURSE:  Chest pain  Atypical symptoms, low suspicion for acute pericarditis  Telemetry negative,Cardiac rehab strips reviewed and only showed NSR/ST with occ PVCs, TSH normal, no documentation of paroxysmal atrial fibrillation found  VQ scan low probability  Likely secondary to esophageal/GI etiology  2 day stress test due to morbid obesity, nuclear image is still pending Echocardiogram shows ejection fraction 35-40% unchanged.  - Final results of nuclear study show Anterior wall ischemia . Notified Dr Stanford Breed ,they will put in the precath orders for allergy to iv dye and  Do cath on monday   Questionable paroxysmal atrial fibrillation  May need an outpatient event monitor cardiology to arrange for this Followup with cardiology outpatient if indicated  Peripartum cardiomyopathy with systolic heart failure  Repeat 2-D echo done as above  - on spironolactone 25 mg daily, coreg 25 mg  bid, losartan 50 mg daily, lasix 80 mg po bid   Elevated d-dimer: V/Q negative  Hypertension: continue HF medications as above    HPI/Subjective: No complaints, the results of the stress test  Objective: Filed Vitals:   02/22/14 2328 02/23/14 0015 02/23/14 0331 02/23/14 0734  BP:  84/48 103/54 132/50  Pulse: 76 86 89 78  Temp:  98.2 F (36.8 C) 98.2 F (36.8 C) 98 F (36.7 C)  TempSrc:  Oral Oral   Resp: 16 18 18    Height:      Weight:   117.2 kg (258 lb 6.1 oz)   SpO2: 99% 98% 98% 99%    Intake/Output Summary (Last 24 hours) at 02/23/14 1008 Last data filed at 02/22/14 2015  Gross per 24 hour  Intake    200 ml  Output   2100 ml  Net  -1900 ml    Exam:  General: alert & oriented x 3 In NAD  Cardiovascular: RRR, nl S1 s2  Respiratory: Decreased breath sounds at the bases, scattered rhonchi, no crackles  Abdomen: soft +BS NT/ND, no masses palpable  Extremities: No cyanosis and no edema      Data Reviewed: Basic Metabolic Panel:  Recent Labs Lab 02/19/14 1617 02/20/14 0406 02/21/14 0405  NA 138 138 138  K 4.0 3.7 4.8  CL 100 102 102  CO2 27 25 25   GLUCOSE 88 91 102*  BUN 7 7 14   CREATININE 0.75 0.76 1.16*  CALCIUM 9.4 8.7 9.1    Liver Function Tests:  Recent Labs Lab 02/19/14 1700 02/20/14 0406 02/21/14 0405  AST 17 44* 20  ALT 12 28 20   ALKPHOS 110 117 113  BILITOT 0.4  0.5 <0.2*  PROT 7.5 7.0 7.2  ALBUMIN 3.9 3.4* 3.4*    Recent Labs Lab 02/19/14 1700  LIPASE 21   No results found for this basename: AMMONIA,  in the last 168 hours  CBC:  Recent Labs Lab 02/19/14 1617 02/20/14 0406 02/21/14 0405  WBC 7.5 6.1 6.3  NEUTROABS 4.5 3.3  --   HGB 13.0 11.7* 11.7*  HCT 39.6 35.8* 36.2  MCV 87.0 84.8 85.6  PLT 287 233 259    Cardiac Enzymes:  Recent Labs Lab 02/20/14 0405 02/20/14 1139 02/20/14 1810  TROPONINI <0.30 <0.30 <0.30   BNP (last 3 results)  Recent Labs  10/03/13 1935 02/19/14 1643 02/20/14 1510  PROBNP  1319.0* 947.6* 933.6*     CBG: No results found for this basename: GLUCAP,  in the last 168 hours  Recent Results (from the past 240 hour(s))  MRSA PCR SCREENING     Status: None   Collection Time    02/20/14 12:30 PM      Result Value Ref Range Status   MRSA by PCR NEGATIVE  NEGATIVE Final   Comment:            The GeneXpert MRSA Assay (FDA     approved for NASAL specimens     only), is one component of a     comprehensive MRSA colonization     surveillance program. It is not     intended to diagnose MRSA     infection nor to guide or     monitor treatment for     MRSA infections.     Studies: Dg Chest 2 View  02/19/2014   CLINICAL DATA:  Central chest pain for 1 day, history hypertension, CHF, DVT  EXAM: CHEST  2 VIEW  COMPARISON:  10/03/2013  FINDINGS: Enlargement of cardiac silhouette.  Mediastinal contours and pulmonary vascularity normal.  Lungs clear.  Slight elevation RIGHT diaphragm unchanged.  No pleural effusion or pneumothorax.  Bones unremarkable.  IMPRESSION: Enlargement of cardiac silhouette.  No acute abnormalities.   Electronically Signed   By: Lavonia Dana M.D.   On: 02/19/2014 17:09   US Abdomen Complete  02/20/2014   CLINICAL DATA:  Epigastric pain with nausea for 2 days. History of cholecystectomy and biliary stenting.  EXAM: ULTRASOUND ABDOMEN COMPLETE  COMPARISON:  Abdominal pelvic CT 07/19/2013.  FINDINGS: Examination is mildly limited by body habitus.  Gallbladder:  Status post cholecystectomy.  Common bile duct:  Diameter: 8.6 mm. This appears increased from prior CT. No intraductal stones demonstrated.  Liver:  Mildly increased echogenicity.  No focal abnormality apparent.  IVC:  No abnormality visualized.  Pancreas:  Visualized portion unremarkable.  Spleen:  Size and appearance within normal limits.  Right Kidney:  Length: 12.8 cm. There is a probable small cyst in the interpolar region measuring 8 mm maximally. No suspicious cortical lesion or  hydronephrosis.  Left Kidney:  Length: 11.8 cm. Echogenicity within normal limits. No mass or hydronephrosis visualized.  Abdominal aorta:  No aneurysm visualized.  Other findings:  None.  IMPRESSION: 1. Mild extrahepatic biliary dilatation status post cholecystectomy. Although increased from prior CT, laboratory tests today do not show evidence of biliary obstruction. 2. Probable mild hepatic steatosis.   Electronically Signed   By: Camie Patience M.D.   On: 02/20/2014 19:23   Nm Pulmonary Perf And Vent  02/19/2014   CLINICAL DATA:  Chest pain, tachycardia, elevated D-dimer.  EXAM: NUCLEAR MEDICINE VENTILATION - PERFUSION LUNG SCAN  TECHNIQUE: Ventilation  images were obtained in multiple projections using inhaled aerosol technetium 99 M DTPA. Perfusion images were obtained in multiple projections after intravenous injection of Tc-86m MAA.  RADIOPHARMACEUTICALS:  40 mCi Tc-25m DTPA aerosol and 6 mCi Tc-28m MAA  COMPARISON:  Chest 02/19/2014, V/Q scan 12/30/2012.  FINDINGS: Ventilation: Mostly homogeneous distribution of tracer activity throughout the lungs without focal defect. Tracer activity also identified in the gastrointestinal tract.  Perfusion: No wedge shaped peripheral perfusion defects to suggest acute pulmonary embolism.  IMPRESSION: Very low probability of pulmonary embolus.   Electronically Signed   By: Lucienne Capers M.D.   On: 02/19/2014 23:31    Scheduled Meds: . carvedilol  25 mg Oral BID WC  . colesevelam  625 mg Oral Q breakfast  . enoxaparin (LOVENOX) injection  40 mg Subcutaneous Q24H  . fluticasone  1 spray Each Nare QHS  . furosemide  80 mg Oral BID  . gabapentin  100 mg Oral Daily  . gabapentin  100 mg Oral Q1400  . gabapentin  300 mg Oral QHS  . losartan  50 mg Oral Daily  . pantoprazole  40 mg Oral BID  . potassium chloride SA  40 mEq Oral BID  . sodium chloride  3 mL Intravenous Q12H  . spironolactone  25 mg Oral Daily  . sucralfate  1 g Oral Daily  . Vilazodone HCl   40 mg Oral Daily  . Vitamin D (Ergocalciferol)  50,000 Units Oral Q7 days   Continuous Infusions:   Principal Problem:   Chest pain Active Problems:   Hypertension   OSA (obstructive sleep apnea)   Postpartum cardiomyopathy    Time spent: 40 minutes   [facility] Pager [phone]. If 7PM-7AM, please contact night-coverage at [email or web address], password [facility] 02/23/2014, 10:08 AM  LOS: 4 days

## 2014-02-24 DIAGNOSIS — R931 Abnormal findings on diagnostic imaging of heart and coronary circulation: Secondary | ICD-10-CM

## 2014-02-24 MED ORDER — KETOROLAC TROMETHAMINE 30 MG/ML IJ SOLN
30.0000 mg | Freq: Once | INTRAMUSCULAR | Status: AC
  Administered 2014-02-24: 30 mg via INTRAVENOUS
  Filled 2014-02-24: qty 1

## 2014-02-24 MED ORDER — PREDNISONE 50 MG PO TABS
60.0000 mg | ORAL_TABLET | ORAL | Status: AC
  Administered 2014-02-26: 60 mg via ORAL
  Filled 2014-02-24: qty 1

## 2014-02-24 MED ORDER — DIAZEPAM 5 MG PO TABS
5.0000 mg | ORAL_TABLET | Freq: Every day | ORAL | Status: DC
  Administered 2014-02-24 – 2014-02-25 (×2): 5 mg via ORAL
  Filled 2014-02-24 (×2): qty 1

## 2014-02-24 MED ORDER — PREDNISONE 50 MG PO TABS
60.0000 mg | ORAL_TABLET | ORAL | Status: AC
  Administered 2014-02-25: 60 mg via ORAL
  Filled 2014-02-24: qty 1

## 2014-02-24 NOTE — Progress Notes (Signed)
[facility] PROGRESS NOTE  [name] GMW:[id number] DOB: 1975-11-14 DOA: 02/19/2014 PCP: [name], MD  Assessment/Plan: Principal Problem:   Chest pain Active Problems:   Hypertension   OSA (obstructive sleep apnea)   Postpartum cardiomyopathy    HPI :  38 yo woman with a PMH of hypertension, prior DVT ('14) and postpartum cardiomyopathy dating back 15 years with 6/15 EF of 35-40% with restrictive filling who presents from cardiac rehabilitation for epigastric chest discomfort. She's had the symptoms for 10 hours and initially the pain initially woke her from sleep. She has no associated shortness of breath. During cardiac rehabilitation the chest/epipastrium discomfort was slightly worse leading to ER transfer/presentation. Initial troponin negative and d-dimer elevated. She tells me she felt worse and then better after the GI cocktail. The pain has improved mildly since this AM but been mainly persistent. She was initially in atrial fibrillation and now in sinus rhythm.    HOSPITAL COURSE:  Chest pain  Atypical symptoms, low suspicion for acute pericarditis  Telemetry negative,Cardiac rehab strips reviewed and only showed NSR/ST with occ PVCs, TSH normal, no documentation of paroxysmal atrial fibrillation found  VQ scan low probability  Likely secondary to esophageal/GI etiology  2 day stress test due to morbid obesity, nuclear image abnormal Echocardiogram shows ejection fraction 35-40% unchanged.  - Final results of nuclear study show Anterior wall ischemia .  Notified Dr Stanford Breed ,they will put in the precath orders for allergy to iv dye and Do cath on monday   Questionable paroxysmal atrial fibrillation  May need an outpatient event monitor cardiology to arrange for this  Followup with cardiology outpatient if indicated   Peripartum cardiomyopathy with systolic heart failure  Repeat 2-D echo done as above  - on spironolactone 25 mg daily, coreg 25 mg bid,  losartan 50 mg daily, lasix 80 mg po bid   Elevated d-dimer: V/Q negative   Hypertension: continue HF medications as above    insomnia Restart Valium     HPI/Subjective: No chest pain, complaining of a headache unable to sleep at night, takes Valium at bedtime everyday  Objective: Filed Vitals:   02/23/14 1632 02/23/14 2114 02/23/14 2300 02/24/14 0442  BP: 125/78 93/54  128/56  Pulse: 80 84 86 74  Temp: 98.3 F (36.8 C) 97.6 F (36.4 C)  97.7 F (36.5 C)  TempSrc: Oral Oral  Oral  Resp: 18 18 18 18   Height:      Weight:    114.4 kg (252 lb 3.3 oz)  SpO2: 96% 98% 98% 100%    Intake/Output Summary (Last 24 hours) at 02/24/14 1006 Last data filed at 02/24/14 0842  Gross per 24 hour  Intake   1300 ml  Output   1801 ml  Net   -501 ml    Exam:  General: alert & oriented x 3 In NAD  Cardiovascular: RRR, nl S1 s2  Respiratory: Decreased breath sounds at the bases, scattered rhonchi, no crackles  Abdomen: soft +BS NT/ND, no masses palpable  Extremities: No cyanosis and no edema      Data Reviewed: Basic Metabolic Panel:  Recent Labs Lab 02/19/14 1617 02/20/14 0406 02/21/14 0405  NA 138 138 138  K 4.0 3.7 4.8  CL 100 102 102  CO2 27 25 25   GLUCOSE 88 91 102*  BUN 7 7 14   CREATININE 0.75 0.76 1.16*  CALCIUM 9.4 8.7 9.1    Liver Function Tests:  Recent Labs Lab 02/19/14 1700 02/20/14 0406 02/21/14 0405  AST 17 44* 20  ALT 12 28 20   ALKPHOS 110 117 113  BILITOT 0.4 0.5 <0.2*  PROT 7.5 7.0 7.2  ALBUMIN 3.9 3.4* 3.4*    Recent Labs Lab 02/19/14 1700  LIPASE 21   No results found for this basename: AMMONIA,  in the last 168 hours  CBC:  Recent Labs Lab 02/19/14 1617 02/20/14 0406 02/21/14 0405  WBC 7.5 6.1 6.3  NEUTROABS 4.5 3.3  --   HGB 13.0 11.7* 11.7*  HCT 39.6 35.8* 36.2  MCV 87.0 84.8 85.6  PLT 287 233 259    Cardiac Enzymes:  Recent Labs Lab 02/20/14 0405 02/20/14 1139 02/20/14 1810  TROPONINI <0.30 <0.30 <0.30    BNP (last 3 results)  Recent Labs  10/03/13 1935 02/19/14 1643 02/20/14 1510  PROBNP 1319.0* 947.6* 933.6*     CBG: No results found for this basename: GLUCAP,  in the last 168 hours  Recent Results (from the past 240 hour(s))  MRSA PCR SCREENING     Status: None   Collection Time    02/20/14 12:30 PM      Result Value Ref Range Status   MRSA by PCR NEGATIVE  NEGATIVE Final   Comment:            The GeneXpert MRSA Assay (FDA     approved for NASAL specimens     only), is one component of a     comprehensive MRSA colonization     surveillance program. It is not     intended to diagnose MRSA     infection nor to guide or     monitor treatment for     MRSA infections.     Studies: Dg Chest 2 View  02/19/2014   CLINICAL DATA:  Central chest pain for 1 day, history hypertension, CHF, DVT  EXAM: CHEST  2 VIEW  COMPARISON:  10/03/2013  FINDINGS: Enlargement of cardiac silhouette.  Mediastinal contours and pulmonary vascularity normal.  Lungs clear.  Slight elevation RIGHT diaphragm unchanged.  No pleural effusion or pneumothorax.  Bones unremarkable.  IMPRESSION: Enlargement of cardiac silhouette.  No acute abnormalities.   Electronically Signed   By: Lavonia Dana M.D.   On: 02/19/2014 17:09   US Abdomen Complete  02/20/2014   CLINICAL DATA:  Epigastric pain with nausea for 2 days. History of cholecystectomy and biliary stenting.  EXAM: ULTRASOUND ABDOMEN COMPLETE  COMPARISON:  Abdominal pelvic CT 07/19/2013.  FINDINGS: Examination is mildly limited by body habitus.  Gallbladder:  Status post cholecystectomy.  Common bile duct:  Diameter: 8.6 mm. This appears increased from prior CT. No intraductal stones demonstrated.  Liver:  Mildly increased echogenicity.  No focal abnormality apparent.  IVC:  No abnormality visualized.  Pancreas:  Visualized portion unremarkable.  Spleen:  Size and appearance within normal limits.  Right Kidney:  Length: 12.8 cm. There is a probable small cyst in  the interpolar region measuring 8 mm maximally. No suspicious cortical lesion or hydronephrosis.  Left Kidney:  Length: 11.8 cm. Echogenicity within normal limits. No mass or hydronephrosis visualized.  Abdominal aorta:  No aneurysm visualized.  Other findings:  None.  IMPRESSION: 1. Mild extrahepatic biliary dilatation status post cholecystectomy. Although increased from prior CT, laboratory tests today do not show evidence of biliary obstruction. 2. Probable mild hepatic steatosis.   Electronically Signed   By: Camie Patience M.D.   On: 02/20/2014 19:23   Nm Myocar Multi W/spect W/wall Motion / Ef  02/23/2014  CLINICAL DATA:  Chest pain.  EXAM: MYOCARDIAL IMAGING WITH SPECT (REST AND PHARMACOLOGIC-STRESS - 2 DAY PROTOCOL)  GATED LEFT VENTRICULAR WALL MOTION STUDY  LEFT VENTRICULAR EJECTION FRACTION  TECHNIQUE: Standard myocardial SPECT imaging was performed after resting intravenous injection of 30 mCi Tc-21m sestamibi. Subsequently, on a second day, intravenous infusion of Lexiscan was performed under the supervision of the Cardiology staff. At peak effect of the drug, 30 mCi Tc-32m sestamibi was injected intravenously and standard myocardial SPECT imaging was performed. Quantitative gated imaging was also performed to evaluate left ventricular wall motion, and estimate left ventricular ejection fraction.  COMPARISON:  None.  FINDINGS: Perfusion: There is a reversible anterior wall defect consistent with myocardial ischemia. Mild associated anterior wall motion abnormality.  Wall Motion: Mild anterior wall motion abnormality.  Left Ventricular Ejection Fraction: 39 %  End diastolic volume 308 ml  End systolic volume 90 ml  IMPRESSION: 1. Anterior wall ischemia.  2. Mild anterior wall motion abnormality.  3. Left ventricular ejection fraction 39%  4. Intermediate-risk stress test findings*.  *2012 Appropriate Use Criteria for Coronary Revascularization Focused Update: J Am Coll Cardiol. 6578;46(9):629-528.  http://content.airportbarriers.com.aspx?articleid=1201161   Electronically Signed   By: Kalman Jewels M.D.   On: 02/23/2014 13:29   Nm Pulmonary Perf And Vent  02/19/2014   CLINICAL DATA:  Chest pain, tachycardia, elevated D-dimer.  EXAM: NUCLEAR MEDICINE VENTILATION - PERFUSION LUNG SCAN  TECHNIQUE: Ventilation images were obtained in multiple projections using inhaled aerosol technetium 99 M DTPA. Perfusion images were obtained in multiple projections after intravenous injection of Tc-71m MAA.  RADIOPHARMACEUTICALS:  40 mCi Tc-50m DTPA aerosol and 6 mCi Tc-101m MAA  COMPARISON:  Chest 02/19/2014, V/Q scan 12/30/2012.  FINDINGS: Ventilation: Mostly homogeneous distribution of tracer activity throughout the lungs without focal defect. Tracer activity also identified in the gastrointestinal tract.  Perfusion: No wedge shaped peripheral perfusion defects to suggest acute pulmonary embolism.  IMPRESSION: Very low probability of pulmonary embolus.   Electronically Signed   By: Lucienne Capers M.D.   On: 02/19/2014 23:31    Scheduled Meds: . carvedilol  25 mg Oral BID WC  . colesevelam  625 mg Oral Q breakfast  . diazepam  5 mg Oral QHS  . enoxaparin (LOVENOX) injection  40 mg Subcutaneous Q24H  . fluticasone  1 spray Each Nare QHS  . furosemide  80 mg Oral BID  . gabapentin  100 mg Oral Daily  . gabapentin  100 mg Oral Q1400  . gabapentin  300 mg Oral QHS  . ketorolac  30 mg Intravenous Once  . losartan  50 mg Oral Daily  . pantoprazole  40 mg Oral BID  . potassium chloride SA  40 mEq Oral BID  . sodium chloride  3 mL Intravenous Q12H  . spironolactone  25 mg Oral Daily  . sucralfate  1 g Oral Daily  . Vilazodone HCl  40 mg Oral Daily  . Vitamin D (Ergocalciferol)  50,000 Units Oral Q7 days   Continuous Infusions:   Principal Problem:   Chest pain Active Problems:   Hypertension   OSA (obstructive sleep apnea)   Postpartum cardiomyopathy    Time spent: 40  minutes   [facility] Pager [phone]. If 7PM-7AM, please contact night-coverage at [email or web address], password [facility] 02/24/2014, 10:06 AM  LOS: 5 days

## 2014-02-24 NOTE — Progress Notes (Signed)
Stress test shows anterior ischemia, reduced EF. Plan for [facility] tomorrow, likely afternoon. Will place cath orders for tomorrow.  Pixie Casino, MD, [facility] Attending Cardiologist [place]

## 2014-02-25 LAB — COMPREHENSIVE METABOLIC PANEL
ALT: 20 U/L (ref 0–35)
AST: 29 U/L (ref 0–37)
Albumin: 4.2 g/dL (ref 3.5–5.2)
Alkaline Phosphatase: 123 U/L — ABNORMAL HIGH (ref 39–117)
Anion gap: 10 (ref 5–15)
BUN: 26 mg/dL — ABNORMAL HIGH (ref 6–23)
CO2: 30 mEq/L (ref 19–32)
Calcium: 10 mg/dL (ref 8.4–10.5)
Chloride: 98 mEq/L (ref 96–112)
Creatinine, Ser: 1.21 mg/dL — ABNORMAL HIGH (ref 0.50–1.10)
GFR calc Af Amer: 64 mL/min — ABNORMAL LOW (ref >90)
GFR calc non Af Amer: 56 mL/min — ABNORMAL LOW (ref >90)
Glucose, Bld: 87 mg/dL (ref 70–99)
Potassium: 5.3 mEq/L (ref 3.7–5.3)
Sodium: 138 mEq/L (ref 137–147)
Total Bilirubin: 0.3 mg/dL (ref 0.3–1.2)
Total Protein: 8.5 g/dL — ABNORMAL HIGH (ref 6.0–8.3)

## 2014-02-25 MED ORDER — SODIUM CHLORIDE 0.9 % IJ SOLN
3.0000 mL | Freq: Two times a day (BID) | INTRAMUSCULAR | Status: DC
  Administered 2014-02-26: 3 mL via INTRAVENOUS

## 2014-02-25 MED ORDER — SODIUM CHLORIDE 0.9 % IV SOLN: INTRAVENOUS | Status: DC

## 2014-02-25 MED ORDER — SODIUM CHLORIDE 0.9 % IJ SOLN
3.0000 mL | Freq: Two times a day (BID) | INTRAMUSCULAR | Status: DC
  Administered 2014-02-25: 23:00:00 via INTRAVENOUS

## 2014-02-25 MED ORDER — SODIUM CHLORIDE 0.9 % IV SOLN
INTRAVENOUS | Status: DC
  Administered 2014-02-26: 10 mL/h via INTRAVENOUS

## 2014-02-25 MED ORDER — FAMOTIDINE 20 MG PO TABS
20.0000 mg | ORAL_TABLET | ORAL | Status: AC
  Administered 2014-02-26: 20 mg via ORAL
  Filled 2014-02-25: qty 1

## 2014-02-25 MED ORDER — SODIUM CHLORIDE 0.9 % IV SOLN: 250.0000 mL | INTRAVENOUS | Status: DC | PRN

## 2014-02-25 MED ORDER — DIPHENHYDRAMINE HCL 50 MG/ML IJ SOLN
25.0000 mg | INTRAMUSCULAR | Status: AC
  Administered 2014-02-26: 25 mg via INTRAVENOUS
  Filled 2014-02-25: qty 1

## 2014-02-25 MED ORDER — SODIUM CHLORIDE 0.9 % IJ SOLN: 3.0000 mL | INTRAMUSCULAR | Status: DC | PRN

## 2014-02-25 MED ORDER — POTASSIUM CHLORIDE CRYS ER 20 MEQ PO TBCR
40.0000 meq | EXTENDED_RELEASE_TABLET | Freq: Two times a day (BID) | ORAL | Status: DC
  Filled 2014-02-25: qty 2

## 2014-02-25 MED ORDER — DIAZEPAM 5 MG PO TABS
5.0000 mg | ORAL_TABLET | Freq: Once | ORAL | Status: AC
  Administered 2014-02-25: 5 mg via ORAL
  Filled 2014-02-25: qty 1

## 2014-02-25 NOTE — Progress Notes (Signed)
Subjective: No complaints  Objective: Vital signs in last 24 hours: Temp:  [98 F (36.7 C)-98.3 F (36.8 C)] 98 F (36.7 C) (10/04 0505) Pulse Rate:  [73-76] 76 (10/04 0505) Resp:  [18-20] 20 (10/04 0505) BP: (99-115)/(48-77) 99/48 mmHg (10/04 0505) SpO2:  [99 %] 99 % (10/04 0505) Weight:  [248 lb 14.4 oz (112.9 kg)] 248 lb 14.4 oz (112.9 kg) (10/04 0505) Last BM Date: 02/24/14  Intake/Output from previous day: 10/03 0701 - 10/04 0700 In: 480 [P.O.:480] Out: 300 [Urine:300] Intake/Output this shift:    Medications Current Facility-Administered Medications  Medication Dose Route Frequency Provider Last Rate Last Dose  . acetaminophen (TYLENOL) tablet 650 mg  650 mg Oral Q6H PRN Rise Patience, MD   650 mg at 02/24/14 8338   Or  . acetaminophen (TYLENOL) suppository 650 mg  650 mg Rectal Q6H PRN Rise Patience, MD      . carvedilol (COREG) tablet 25 mg  25 mg Oral BID WC Rise Patience, MD   25 mg at 02/25/14 0800  . colesevelam [facility]) tablet 625 mg  625 mg Oral Q breakfast Rise Patience, MD   625 mg at 02/25/14 0800  . cyclobenzaprine (FLEXERIL) tablet 10 mg  10 mg Oral TID PRN Rise Patience, MD      . diazepam (VALIUM) tablet 5 mg  5 mg Oral QHS Reyne Dumas, MD   5 mg at 02/24/14 2227  . dicyclomine (BENTYL) capsule 10 mg  10 mg Oral TID PRN Rise Patience, MD      . enoxaparin (LOVENOX) injection 40 mg  40 mg Subcutaneous Q24H Rise Patience, MD   40 mg at 02/24/14 2127  . fluticasone (FLONASE) 50 MCG/ACT nasal spray 1 spray  1 spray Each Nare QHS Rise Patience, MD   1 spray at 02/24/14 2227  . furosemide (LASIX) tablet 80 mg  80 mg Oral BID Rise Patience, MD   80 mg at 02/25/14 0800  . gabapentin (NEURONTIN) capsule 100 mg  100 mg Oral Daily Reyne Dumas, MD   100 mg at 02/25/14 1000  . gabapentin (NEURONTIN) capsule 100 mg  100 mg Oral Q1400 Reyne Dumas, MD   100 mg at 02/24/14 1040  . gabapentin (NEURONTIN) capsule  300 mg  300 mg Oral QHS Rise Patience, MD   300 mg at 02/24/14 2126  . losartan (COZAAR) tablet 50 mg  50 mg Oral Daily Rise Patience, MD   50 mg at 02/25/14 1000  . modafinil (PROVIGIL) tablet 200 mg  200 mg Oral Daily PRN Rise Patience, MD      . morphine 2 MG/ML injection 1 mg  1 mg Intravenous Q3H PRN Rise Patience, MD   1 mg at 02/21/14 0758  . ondansetron (ZOFRAN) tablet 4 mg  4 mg Oral Q6H PRN Rise Patience, MD       Or  . ondansetron [facility]) injection 4 mg  4 mg Intravenous Q6H PRN Rise Patience, MD      . pantoprazole (PROTONIX) EC tablet 40 mg  40 mg Oral BID Bonnielee Haff, MD   40 mg at 02/25/14 1000  . potassium chloride SA (K-DUR,KLOR-CON) CR tablet 40 mEq  40 mEq Oral BID Rise Patience, MD   40 mEq at 02/25/14 1000  . predniSONE (DELTASONE) tablet 60 mg  60 mg Oral Pre-Cath Lelon Perla, MD       Followed by  . [  START ON 02/26/2014] predniSONE (DELTASONE) tablet 60 mg  60 mg Oral Pre-Cath Lelon Perla, MD      . sodium chloride 0.9 % injection 3 mL  3 mL Intravenous Q12H Rise Patience, MD   3 mL at 02/25/14 1000  . spironolactone (ALDACTONE) tablet 25 mg  25 mg Oral Daily Rise Patience, MD   25 mg at 02/25/14 1000  . sucralfate (CARAFATE) tablet 1 g  1 g Oral Daily Rise Patience, MD   1 g at 02/25/14 1000  . Vilazodone HCl (VIIBRYD) TABS 40 mg  40 mg Oral Daily Reyne Dumas, MD   40 mg at 02/25/14 1000  . Vitamin D (Ergocalciferol) (DRISDOL) capsule 50,000 Units  50,000 Units Oral Q7 days Rise Patience, MD        PE: General appearance: alert, cooperative and no distress Lungs: clear to auscultation bilaterally Heart: regular rate and rhythm, S1, S2 normal, no murmur, click, rub or gallop Extremities: No LEE Pulses: 2+ and symmetric Skin: Warm and dry Neurologic: Grossly normal   Studies/Results: MYOCARDIAL IMAGING WITH SPECT (REST AND PHARMACOLOGIC-STRESS - 2 DAY  PROTOCOL)  GATED LEFT  VENTRICULAR WALL MOTION STUDY  LEFT VENTRICULAR EJECTION FRACTION  TECHNIQUE:  Standard myocardial SPECT imaging was performed after resting  intravenous injection of 30 mCi Tc-87m sestamibi. Subsequently, on a  second day, intravenous infusion of Lexiscan was performed under the  supervision of the Cardiology staff. At peak effect of the drug, 30  mCi Tc-49m sestamibi was injected intravenously and standard  myocardial SPECT imaging was performed. Quantitative gated imaging  was also performed to evaluate left ventricular wall motion, and  estimate left ventricular ejection fraction.  COMPARISON: None.  FINDINGS:  Perfusion: There is a reversible anterior wall defect consistent  with myocardial ischemia. Mild associated anterior wall motion  abnormality.  Wall Motion: Mild anterior wall motion abnormality.  Left Ventricular Ejection Fraction: 39 %  End diastolic volume 308 ml  End systolic volume 90 ml  IMPRESSION:  1. Anterior wall ischemia.  2. Mild anterior wall motion abnormality.  3. Left ventricular ejection fraction 39%  4. Intermediate-risk stress test findings*.    Assessment/Plan  Principal Problem:   Chest pain Active Problems:   Hypertension   OSA (obstructive sleep apnea)   Postpartum cardiomyopathy   Obesity, morbid, BMI 40.0-49.9   Positive stress test  Plan: Stress test shows anterior ischemia, reduced EF.  LHC tomorrow.  Mildly hypotensive.  Euvolemic. On coreg 25 bid, lasix 80mg  BID, cozaar 50, spironolactone 25.  Hold lasix and K tonight and in the morning.  Hold cozaar and aldactone tomorrow.  Prednisone ordered tonight and tomorrow for contrast allergy.  She developed acute kidney failure the last time she had several contrasted procedures.  BMET in the morning.  Referral to registered dietician offered.  Contact information given.    LOS: 6 days    HAGER, BRYAN PA-C 02/25/2014 11:23 AM  Personally seen and examined. Agree with above. Hopefully  false positive NUC stress.  Answered several questions, risks of procedure (death, MI, CVA, renal damage). Willing to proceed.  Candee Furbish, MD

## 2014-02-25 NOTE — Progress Notes (Signed)
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 She's had the symptoms for 10 hours and initially the pain initially woke her from sleep. She has no associated shortness of breath. During cardiac rehabilitation the chest/epipastrium discomfort was slightly worse leading to ER transfer/presentation. Initial troponin negative and d-dimer elevated. She tells me she felt worse and then better after the GI cocktail. The pain has improved mildly since this AM but been mainly persistent. She was initially in atrial fibrillation and now in sinus rhythm.   HOSPITAL COURSE:  Chest pain  Atypical symptoms, low suspicion for acute pericarditis  Telemetry negative,Cardiac rehab strips reviewed and only showed NSR/ST with occ PVCs, TSH normal, no documentation of paroxysmal atrial fibrillation found  VQ scan low probability  Likely secondary to esophageal/GI etiology  2 day stress test due to morbid obesity,  Echocardiogram shows ejection fraction 35-40% unchanged.  - Final results of nuclear study show Anterior wall ischemia .  Notified Dr Stanford Breed , precath orders, prepared with prednisone and Benadryl, cath on monday   Questionable paroxysmal atrial fibrillation  May need an outpatient event monitor cardiology to arrange for this  Followup with cardiology outpatient if indicated   Peripartum cardiomyopathy with systolic heart failure  Repeat 2-D echo done as above  - on spironolactone 25 mg daily, coreg 25 mg bid, losartan 50 mg daily, lasix 80 mg  po bid   Elevated d-dimer: V/Q negative   Hypertension: continue HF medications as above   insomnia  Restart Valium       HPI/Subjective: Comfortable in no acute distress  Objective: Filed Vitals:   02/23/14 2300 02/24/14 0442 02/24/14 2003 02/25/14 0505  BP:  128/56 115/77 99/48  Pulse: 86 74 73 76  Temp:  97.7 F (36.5 C) 98.3 F (36.8 C) 98 F (36.7 C)  TempSrc:  Oral Oral Oral  Resp: 18 18 18 20   Height:      Weight:  114.4 kg (252 lb 3.3 oz)  112.9 kg (248 lb 14.4 oz)  SpO2: 98% 100% 99% 99%    Intake/Output Summary (Last 24 hours) at 02/25/14 1015 Last data filed at 02/25/14 0505  Gross per 24 hour  Intake    240 ml  Output    300 ml  Net    -60 ml    Exam:  General: alert & oriented x 3 In NAD  Cardiovascular: RRR, nl S1 s2  Respiratory: Decreased breath sounds at the bases, scattered rhonchi, no crackles  Abdomen: soft +BS NT/ND, no masses palpable  Extremities: No cyanosis and no edema      Data Reviewed: Basic Metabolic Panel:  Recent Labs Lab 02/19/14 1617 02/20/14 0406 02/21/14 0405  NA 138 138 138  K 4.0 3.7 4.8  CL 100 102 102  CO2 27 25 25   GLUCOSE 88 91 102*  BUN 7 7 14   CREATININE 0.75 0.76 1.16*  CALCIUM 9.4 8.7 9.1    Liver Function Tests:  Recent Labs Lab 02/19/14 1700 02/20/14 0406 02/21/14 0405  AST 17 44* 20  ALT 12 28 20  ALKPHOS 110 117 113  BILITOT 0.4 0.5 <0.2*  PROT 7.5 7.0 7.2  ALBUMIN 3.9 3.4* 3.4*    Recent Labs Lab 02/19/14 1700  LIPASE 21   No results found for this basename: AMMONIA,  in the last 168 hours  CBC:  Recent Labs Lab 02/19/14 1617 02/20/14 0406 02/21/14 0405  WBC 7.5 6.1 6.3  NEUTROABS 4.5 3.3  --   HGB 13.0 11.7* 11.7*  HCT 39.6 35.8* 36.2  MCV 87.0 84.8 85.6  PLT 287 233 259    Cardiac Enzymes:  Recent Labs Lab 02/20/14 0405 02/20/14 1139 02/20/14 1810  TROPONINI <0.30 <0.30 <0.30   BNP (last 3 results)  Recent Labs  10/03/13 1935 02/19/14 1643  02/20/14 1510  PROBNP 1319.0* 947.6* 933.6*     CBG: No results found for this basename: GLUCAP,  in the last 168 hours  Recent Results (from the past 240 hour(s))  MRSA PCR SCREENING     Status: None   Collection Time    02/20/14 12:30 PM      Result Value Ref Range Status   MRSA by PCR NEGATIVE  NEGATIVE Final   Comment:            The GeneXpert MRSA Assay (FDA     approved for NASAL specimens     only), is one component of a     comprehensive MRSA colonization     surveillance program. It is not     intended to diagnose MRSA     infection nor to guide or     monitor treatment for     MRSA infections.     Studies: Dg Chest 2 View  02/19/2014   CLINICAL DATA:  Central chest pain for 1 day, history hypertension, CHF, DVT  EXAM: CHEST  2 VIEW  COMPARISON:  10/03/2013  FINDINGS: Enlargement of cardiac silhouette.  Mediastinal contours and pulmonary vascularity normal.  Lungs clear.  Slight elevation RIGHT diaphragm unchanged.  No pleural effusion or pneumothorax.  Bones unremarkable.  IMPRESSION: Enlargement of cardiac silhouette.  No acute abnormalities.   Electronically Signed   By: Lavonia Dana M.D.   On: 02/19/2014 17:09   US Abdomen Complete  02/20/2014   CLINICAL DATA:  Epigastric pain with nausea for 2 days. History of cholecystectomy and biliary stenting.  EXAM: ULTRASOUND ABDOMEN COMPLETE  COMPARISON:  Abdominal pelvic CT 07/19/2013.  FINDINGS: Examination is mildly limited by body habitus.  Gallbladder:  Status post cholecystectomy.  Common bile duct:  Diameter: 8.6 mm. This appears increased from prior CT. No intraductal stones demonstrated.  Liver:  Mildly increased echogenicity.  No focal abnormality apparent.  IVC:  No abnormality visualized.  Pancreas:  Visualized portion unremarkable.  Spleen:  Size and appearance within normal limits.  Right Kidney:  Length: 12.8 cm. There is a probable small cyst in the interpolar region measuring 8 mm maximally. No suspicious cortical  lesion or hydronephrosis.  Left Kidney:  Length: 11.8 cm. Echogenicity within normal limits. No mass or hydronephrosis visualized.  Abdominal aorta:  No aneurysm visualized.  Other findings:  None.  IMPRESSION: 1. Mild extrahepatic biliary dilatation status post cholecystectomy. Although increased from prior CT, laboratory tests today do not show evidence of biliary obstruction. 2. Probable mild hepatic steatosis.   Electronically Signed   By: Camie Patience M.D.   On: 02/20/2014 19:23   Nm Myocar Multi W/spect W/wall Motion / Ef  02/23/2014   CLINICAL DATA:  Chest pain.  EXAM: MYOCARDIAL IMAGING WITH  SPECT (REST AND PHARMACOLOGIC-STRESS - 2 DAY PROTOCOL)  GATED LEFT VENTRICULAR WALL MOTION STUDY  LEFT VENTRICULAR EJECTION FRACTION  TECHNIQUE: Standard myocardial SPECT imaging was performed after resting intravenous injection of 30 mCi Tc-13m sestamibi. Subsequently, on a second day, intravenous infusion of Lexiscan was performed under the supervision of the Cardiology staff. At peak effect of the drug, 30 mCi Tc-47m sestamibi was injected intravenously and standard myocardial SPECT imaging was performed. Quantitative gated imaging was also performed to evaluate left ventricular wall motion, and estimate left ventricular ejection fraction.  COMPARISON:  None.  FINDINGS: Perfusion: There is a reversible anterior wall defect consistent with myocardial ischemia. Mild associated anterior wall motion abnormality.  Wall Motion: Mild anterior wall motion abnormality.  Left Ventricular Ejection Fraction: 39 %  End diastolic volume 891 ml  End systolic volume 90 ml  IMPRESSION: 1. Anterior wall ischemia.  2. Mild anterior wall motion abnormality.  3. Left ventricular ejection fraction 39%  4. Intermediate-risk stress test findings*.  *2012 Appropriate Use Criteria for Coronary Revascularization Focused Update: J Am Coll Cardiol. 6945;03(8):882-800. http://content.airportbarriers.com.aspx?articleid=[email or web address]    Electronically Signed   By: Kalman Jewels M.D.   On: 02/23/2014 13:29   Nm Pulmonary Perf And Vent  02/19/2014   CLINICAL DATA:  Chest pain, tachycardia, elevated D-dimer.  EXAM: NUCLEAR MEDICINE VENTILATION - PERFUSION LUNG SCAN  TECHNIQUE: Ventilation images were obtained in multiple projections using inhaled aerosol technetium 99 M DTPA. Perfusion images were obtained in multiple projections after intravenous injection of Tc-73m MAA.  RADIOPHARMACEUTICALS:  40 mCi Tc-73m DTPA aerosol and 6 mCi Tc-72m MAA  COMPARISON:  Chest 02/19/2014, V/Q scan 12/30/2012.  FINDINGS: Ventilation: Mostly homogeneous distribution of tracer activity throughout the lungs without focal defect. Tracer activity also identified in the gastrointestinal tract.  Perfusion: No wedge shaped peripheral perfusion defects to suggest acute pulmonary embolism.  IMPRESSION: Very low probability of pulmonary embolus.   Electronically Signed   By: Lucienne Capers M.D.   On: 02/19/2014 23:31    Scheduled Meds: . carvedilol  25 mg Oral BID WC  . colesevelam  625 mg Oral Q breakfast  . diazepam  5 mg Oral QHS  . enoxaparin (LOVENOX) injection  40 mg Subcutaneous Q24H  . fluticasone  1 spray Each Nare QHS  . furosemide  80 mg Oral BID  . gabapentin  100 mg Oral Daily  . gabapentin  100 mg Oral Q1400  . gabapentin  300 mg Oral QHS  . losartan  50 mg Oral Daily  . pantoprazole  40 mg Oral BID  . potassium chloride SA  40 mEq Oral BID  . predniSONE  60 mg Oral Pre-Cath   Followed by  . [START ON 02/26/2014] predniSONE  60 mg Oral Pre-Cath  . sodium chloride  3 mL Intravenous Q12H  . spironolactone  25 mg Oral Daily  . sucralfate  1 g Oral Daily  . Vilazodone HCl  40 mg Oral Daily  . Vitamin D (Ergocalciferol)  50,000 Units Oral Q7 days   Continuous Infusions:   Principal Problem:   Chest pain Active Problems:   Hypertension   OSA (obstructive sleep apnea)   Postpartum cardiomyopathy    Time spent: 40  minutes   [facility] Pager [phone]. If 7PM-7AM, please contact night-coverage at [email or web address], password [facility] 02/25/2014, 10:15 AM  LOS: 6 days

## 2014-02-25 NOTE — Progress Notes (Signed)
Patient told RT that she would apply the CPAP when she was ready to go to bed. Patient was able to demonstrate the ability to place the CPAP on without assistance. RT will continue to monitor.

## 2014-02-26 ENCOUNTER — Encounter (HOSPITAL_COMMUNITY): Admission: RE | Admit: 2014-02-26 | Payer: 59 | Source: Ambulatory Visit

## 2014-02-26 ENCOUNTER — Telehealth: Payer: Self-pay | Admitting: Cardiology

## 2014-02-26 ENCOUNTER — Encounter (HOSPITAL_COMMUNITY)
Admission: EM | Disposition: A | Discharge status: Home / Self Care | Payer: Self-pay | Source: Home / Self Care | Attending: Internal Medicine

## 2014-02-26 HISTORY — PX: LEFT HEART CATHETERIZATION WITH CORONARY ANGIOGRAM: SHX5451

## 2014-02-26 LAB — CBC
HCT: 39.4 % (ref 36.0–46.0)
Hemoglobin: 12.8 g/dL (ref 12.0–15.0)
MCH: 28.8 pg (ref 26.0–34.0)
MCHC: 32.5 g/dL (ref 30.0–36.0)
MCV: 88.5 fL (ref 78.0–100.0)
Platelets: 291 10*3/uL (ref 150–400)
RBC: 4.45 MIL/uL (ref 3.87–5.11)
RDW: 13.7 % (ref 11.5–15.5)
WBC: 8.5 10*3/uL (ref 4.0–10.5)

## 2014-02-26 LAB — BASIC METABOLIC PANEL
Anion gap: 12 (ref 5–15)
BUN: 23 mg/dL (ref 6–23)
CO2: 25 mEq/L (ref 19–32)
Calcium: 9.6 mg/dL (ref 8.4–10.5)
Chloride: 100 mEq/L (ref 96–112)
Creatinine, Ser: 1.02 mg/dL (ref 0.50–1.10)
GFR calc Af Amer: 79 mL/min — ABNORMAL LOW (ref >90)
GFR calc non Af Amer: 68 mL/min — ABNORMAL LOW (ref >90)
Glucose, Bld: 122 mg/dL — ABNORMAL HIGH (ref 70–99)
Potassium: 5.5 mEq/L — ABNORMAL HIGH (ref 3.7–5.3)
Sodium: 137 mEq/L (ref 137–147)

## 2014-02-26 LAB — PROTIME-INR
INR: 0.93 (ref 0.00–1.49)
Prothrombin Time: 12.5 seconds (ref 11.6–15.2)

## 2014-02-26 SURGERY — LEFT HEART CATHETERIZATION WITH CORONARY ANGIOGRAM
Anesthesia: LOCAL

## 2014-02-26 MED ORDER — LIDOCAINE HCL (PF) 1 % IJ SOLN
INTRAMUSCULAR | Status: AC
  Filled 2014-02-26: qty 30

## 2014-02-26 MED ORDER — ACETAMINOPHEN 325 MG PO TABS: 650.0000 mg | ORAL_TABLET | ORAL | Status: DC | PRN

## 2014-02-26 MED ORDER — ONDANSETRON HCL 4 MG/2ML IJ SOLN
4.0000 mg | Freq: Four times a day (QID) | INTRAMUSCULAR | Status: DC | PRN
Start: 2014-02-26 — End: 2014-02-26

## 2014-02-26 MED ORDER — HEPARIN (PORCINE) IN NACL 2-0.9 UNIT/ML-% IJ SOLN
INTRAMUSCULAR | Status: AC
  Filled 2014-02-26: qty 1000

## 2014-02-26 MED ORDER — ENOXAPARIN SODIUM 40 MG/0.4ML ~~LOC~~ SOLN
40.0000 mg | SUBCUTANEOUS | Status: DC
  Filled 2014-02-26: qty 0.4

## 2014-02-26 MED ORDER — SODIUM CHLORIDE 0.9 % IV SOLN
1.0000 mL/kg/h | INTRAVENOUS | Status: AC
  Administered 2014-02-26: 1 mL/kg/h via INTRAVENOUS

## 2014-02-26 MED ORDER — VERAPAMIL HCL 2.5 MG/ML IV SOLN
INTRAVENOUS | Status: AC
  Filled 2014-02-26: qty 2

## 2014-02-26 MED ORDER — MIDAZOLAM HCL 2 MG/2ML IJ SOLN
INTRAMUSCULAR | Status: AC
  Filled 2014-02-26: qty 2

## 2014-02-26 MED ORDER — NITROGLYCERIN 1 MG/10 ML FOR IR/CATH LAB
INTRA_ARTERIAL | Status: AC
  Filled 2014-02-26: qty 10

## 2014-02-26 MED ORDER — HEPARIN SODIUM (PORCINE) 1000 UNIT/ML IJ SOLN
INTRAMUSCULAR | Status: AC
  Filled 2014-02-26: qty 1

## 2014-02-26 MED ORDER — FENTANYL CITRATE 0.05 MG/ML IJ SOLN
INTRAMUSCULAR | Status: AC
  Filled 2014-02-26: qty 2

## 2014-02-26 NOTE — Telephone Encounter (Signed)
Spoke with pt, she is fine to go back to rehab. Will fax order to them.

## 2014-02-26 NOTE — Telephone Encounter (Signed)
Is wanting to know if she can continue Cardiac Rehab that has been previously schedule . She needs a order stating that . She has been ion the hospital for a week .  Thanks

## 2014-02-26 NOTE — Progress Notes (Signed)
Subjective: No specific complaints.  No SOB  Objective: Vital signs in last 24 hours: Temp:  [98 F (36.7 C)-98.4 F (36.9 C)] 98 F (36.7 C) (10/05 0521) Pulse Rate:  [84-95] 95 (10/05 0521) Resp:  [18] 18 (10/05 0521) BP: (116-126)/(71-75) 116/72 mmHg (10/05 0521) SpO2:  [94 %-100 %] 100 % (10/05 0521) Weight:  [249 lb 11.2 oz (113.263 kg)] 249 lb 11.2 oz (113.263 kg) (10/05 0636) Last BM Date: 02/25/14  Intake/Output from previous day: 10/04 0701 - 10/05 0700 In: 243 [P.O.:240; I.V.:3] Out: 2100 [Urine:2100] Intake/Output this shift:    Medications Current Facility-Administered Medications  Medication Dose Route Frequency Provider Last Rate Last Dose  . 0.9 %  sodium chloride infusion  250 mL Intravenous PRN Lelon Perla, MD      . 0.9 %  sodium chloride infusion   Intravenous Continuous Lelon Perla, MD      . 0.9 %  sodium chloride infusion  250 mL Intravenous PRN Brett Canales, PA-C      . 0.9 %  sodium chloride infusion   Intravenous Continuous Brett Canales, PA-C 10 mL/hr at 02/26/14 0636 10 mL/hr at 02/26/14 0636  . acetaminophen (TYLENOL) tablet 650 mg  650 mg Oral Q6H PRN Rise Patience, MD   650 mg at 02/24/14 1093   Or  . acetaminophen (TYLENOL) suppository 650 mg  650 mg Rectal Q6H PRN Rise Patience, MD      . carvedilol (COREG) tablet 25 mg  25 mg Oral BID WC Rise Patience, MD   25 mg at 02/25/14 0800  . colesevelam [facility]) tablet 625 mg  625 mg Oral Q breakfast Rise Patience, MD   625 mg at 02/25/14 0800  . cyclobenzaprine (FLEXERIL) tablet 10 mg  10 mg Oral TID PRN Rise Patience, MD      . diazepam (VALIUM) tablet 5 mg  5 mg Oral QHS Reyne Dumas, MD   5 mg at 02/25/14 2357  . dicyclomine (BENTYL) capsule 10 mg  10 mg Oral TID PRN Rise Patience, MD      . diphenhydrAMINE (BENADRYL) injection 25 mg  25 mg Intravenous Pre-Cath Lelon Perla, MD      . enoxaparin (LOVENOX) injection 40 mg  40 mg Subcutaneous  Q24H Rise Patience, MD   40 mg at 02/25/14 2254  . fluticasone (FLONASE) 50 MCG/ACT nasal spray 1 spray  1 spray Each Nare QHS Rise Patience, MD   1 spray at 02/25/14 2302  . furosemide (LASIX) tablet 80 mg  80 mg Oral BID Brett Canales, PA-C   80 mg at 02/25/14 0800  . gabapentin (NEURONTIN) capsule 100 mg  100 mg Oral Daily Reyne Dumas, MD   100 mg at 02/25/14 1000  . gabapentin (NEURONTIN) capsule 100 mg  100 mg Oral Q1400 Reyne Dumas, MD   100 mg at 02/25/14 1400  . gabapentin (NEURONTIN) capsule 300 mg  300 mg Oral QHS Rise Patience, MD   300 mg at 02/25/14 2256  . losartan (COZAAR) tablet 50 mg  50 mg Oral Daily Brett Canales, PA-C   50 mg at 02/25/14 1000  . modafinil (PROVIGIL) tablet 200 mg  200 mg Oral Daily PRN Rise Patience, MD      . morphine 2 MG/ML injection 1 mg  1 mg Intravenous Q3H PRN Rise Patience, MD   1 mg at 02/25/14 1154  . ondansetron (ZOFRAN) tablet  4 mg  4 mg Oral Q6H PRN Rise Patience, MD       Or  . ondansetron [facility]) injection 4 mg  4 mg Intravenous Q6H PRN Rise Patience, MD      . pantoprazole (PROTONIX) EC tablet 40 mg  40 mg Oral BID Bonnielee Haff, MD   40 mg at 02/25/14 2256  . potassium chloride SA (K-DUR,KLOR-CON) CR tablet 40 mEq  40 mEq Oral BID Reyne Dumas, MD      . sodium chloride 0.9 % injection 3 mL  3 mL Intravenous Q12H Rise Patience, MD   3 mL at 02/25/14 1000  . sodium chloride 0.9 % injection 3 mL  3 mL Intravenous Q12H Lelon Perla, MD      . sodium chloride 0.9 % injection 3 mL  3 mL Intravenous PRN Lelon Perla, MD      . sodium chloride 0.9 % injection 3 mL  3 mL Intravenous Q12H Brett Canales, PA-C      . sodium chloride 0.9 % injection 3 mL  3 mL Intravenous PRN Brett Canales, PA-C      . spironolactone (ALDACTONE) tablet 25 mg  25 mg Oral Daily Brett Canales, PA-C   25 mg at 02/25/14 1000  . sucralfate (CARAFATE) tablet 1 g  1 g Oral Daily Rise Patience, MD   1 g at 02/25/14  1000  . Vilazodone HCl (VIIBRYD) TABS 40 mg  40 mg Oral Daily Reyne Dumas, MD   40 mg at 02/25/14 1000  . Vitamin D (Ergocalciferol) (DRISDOL) capsule 50,000 Units  50,000 Units Oral Q7 days Rise Patience, MD        PE: General appearance: alert, cooperative and no distress Lungs: clear to auscultation bilaterally Heart: regular rate and rhythm, S1, S2 normal, no murmur, click, rub or gallop Extremities: No LEE Pulses: 2+ and symmetric Skin: Warm and dry Neurologic: Grossly normal  Lab Results:   Recent Labs  02/26/14 0450  WBC 8.5  HGB 12.8  HCT 39.4  PLT 291   BMET  Recent Labs  02/25/14 1420 02/26/14 0450  NA 138 137  K 5.3 5.5*  CL 98 100  CO2 30 25  GLUCOSE 87 122*  BUN 26* 23  CREATININE 1.21* 1.02  CALCIUM 10.0 9.6   PT/INR  Recent Labs  02/26/14 0450  LABPROT 12.5  INR 0.93     Assessment/Plan  Principal Problem:   Chest pain Active Problems:   Hypertension   OSA (obstructive sleep apnea)   Postpartum cardiomyopathy   Obesity, morbid, BMI 40.0-49.9  Plan:   Stress test shows anterior ischemia, reduced EF. LHC today at 0900hrs. BP controlled. Euvolemic. On coreg 25 bid, lasix 80mg  BID, cozaar 50, spironolactone 25.  lasix heldd last night and this morning. Hold cozaar and aldactone today. Prednisone given x two for contrast allergy. She developed acute kidney failure the last time she had several contrasted procedures.  Hyperkalemia-5.5. Holding K and aldactone this morning.    LOS: 7 days    HAGER, BRYAN PA-C 02/26/2014 7:54 AM  I have examined the patient and reviewed assessment and plan and discussed with patient.  Agree with above as stated.  Explained risks and benefits of cath to patient.  All questions answered.  Premedicated for cath.  Possible d/c later today if cath is ok.  Further plans based on cath.  Arihanna Estabrook S.

## 2014-02-26 NOTE — CV Procedure (Signed)
       PROCEDURE:  Left heart catheterization with selective coronary angiography.  INDICATIONS:  Abnormal stress test  The risks, benefits, and details of the procedure were explained to the patient.  The patient verbalized understanding and wanted to proceed.  Informed written consent was obtained.  Due to a contrast allergy, she was premedicated with prednisone.  PROCEDURE TECHNIQUE:  After Xylocaine anesthesia a 38F slender sheath was placed in the right radial artery with a single anterior needle wall stick. IV heparin was given.  Right coronary angiography was done using a Judkins R4 guide catheter.  Left coronary angiography was done using an EBU 3.0 guide catheter.  Left heart cath was done using the EBU 3 catheter.  A TR band was used for hemostasis.    Of note, the patient did have spasm in the right radial artery. Multiple doses of intra-arterial nitroglycerin were given to help relieve the spasm.   CONTRAST:  Total of 30 cc.  COMPLICATIONS:  None.    HEMODYNAMICS:  Aortic pressure was 107/67; LV pressure was 106/6; LVEDP 16.  There was no gradient between the left ventricle and aorta.    ANGIOGRAPHIC DATA:   The left main coronary artery is angiographically normal.  The left anterior descending artery is a large vessel which reaches the apex. The LAD and its branches appear angiographically normal.  The left circumflex artery is a medium size vessel which appears angiographically normal. There is a large ramus vessel which appears angiographically normal.  The right coronary artery is a large dominant vessel which appears angiographically normal. The posterior descending artery is very large and angiographically normal. The posterior lateral artery is medium size and widely patent.  LEFT VENTRICULOGRAM:  Left ventricular angiogram was not done.  LVEDP was 16 mmHg.  IMPRESSIONS:  1. Normal left main coronary artery. 2. Normal left anterior descending artery and its  branches. 3. Normal left circumflex artery and its branches. 4. Normal right coronary artery. 5. Left ventricular systolic function not assessed.  LVEDP 16 mmHg.    RECOMMENDATION:  Continue aggressive preventive therapy and risk factor modification. Cardiology followup with Dr. Stanford Breed.

## 2014-02-26 NOTE — Discharge Summary (Signed)
Physician Discharge Summary  LOREN VICENS MRN: [id number] DOB/AGE: 40-Dec-1977 40 y.o.  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 Stanford Breed, cardiology Followup with PCP CBC, BMP in one week    Medication List    STOP taking these medications       EXCEDRIN PO     ibuprofen 600 MG tablet  Commonly known as:  ADVIL,MOTRIN      TAKE these medications       carvedilol 25 MG tablet  Commonly known as:  COREG  Take 1 tablet (25 mg total) by mouth 2 (two) times daily with a meal.     colesevelam 625 MG tablet  Commonly known as:  WELCHOL  TAKE 1 TABLET BY MOUTH EVERY DAY     cyclobenzaprine 10 MG tablet  Commonly known as:  FLEXERIL  Take 1 tablet (10 mg total) by mouth 3 (three) times daily as needed for muscle spasms.     diazepam 10 MG tablet  Commonly known as:  VALIUM  Take 10 mg by mouth at bedtime as needed for anxiety.     dicyclomine 10 MG capsule  Commonly known as:  BENTYL  Take 10 mg by mouth 3 (three) times daily as needed for spasms.     eucerin cream  Apply 1 application topically as needed for dry skin.     fluticasone 50 MCG/ACT nasal spray  Commonly known as:  FLONASE  Place 1 spray into both nostrils at bedtime. Before donning CPAP     furosemide 40 MG tablet  Commonly known as:  LASIX  Take 2 tablets (80 mg total) by mouth 2 (two) times daily.     gabapentin 300 MG capsule  Commonly known as:  NEURONTIN  Take 300 mg by mouth at bedtime. Daily at bedtime.     gabapentin 100 MG capsule  Commonly known as:  NEURONTIN  Take 1 capsule (100 mg total) by mouth 2 (two) times daily.     losartan 50 MG tablet  Commonly known as:  COZAAR  Take 1 tablet  (50 mg total) by mouth daily.     NUVIGIL 250 MG tablet  Generic drug:  Armodafinil  Take 250 mg by mouth daily as needed (when using CPAP and still short of breath).     pantoprazole 20 MG tablet  Commonly known as:  PROTONIX  Take 1 tablet (20 mg total) by mouth 2 (two) times daily.     potassium chloride SA 20 MEQ tablet  Commonly known as:  K-DUR,KLOR-CON  Take 2 tablets (40 mEq total) by mouth 2 (two) times daily.     PRENATAL 1 + IRON PO  Take 1 tablet by mouth.     spironolactone 25 MG tablet  Commonly known as:  ALDACTONE  Take 1 tablet (25 mg total) by mouth daily.     sucralfate 1 G tablet  Commonly known as:  CARAFATE  Take 1 g by mouth daily.     VIIBRYD 10 MG Tabs  Generic drug:  Vilazodone HCl  Take 40 mg by mouth daily as needed (depressioni).     Vitamin D (Ergocalciferol) 50000 UNITS Caps capsule  Commonly known as:  DRISDOL  Take 50,000 Units  by mouth every 7 (seven) days.        Discharge Condition: Stable Disposition: 01-Home or Self Care   Consults:  Cardiology  Significant Diagnostic Studies: Dg Chest 2 View  02/19/2014   CLINICAL DATA:  Central chest pain for 1 day, history hypertension, CHF, DVT  EXAM: CHEST  2 VIEW  COMPARISON:  10/03/2013  FINDINGS: Enlargement of cardiac silhouette.  Mediastinal contours and pulmonary vascularity normal.  Lungs clear.  Slight elevation RIGHT diaphragm unchanged.  No pleural effusion or pneumothorax.  Bones unremarkable.  IMPRESSION: Enlargement of cardiac silhouette.  No acute abnormalities.   Electronically Signed   By: Lavonia Dana M.D.   On: 02/19/2014 17:09   US Abdomen Complete  02/20/2014   CLINICAL DATA:  Epigastric pain with nausea for 2 days. History of cholecystectomy and biliary stenting.  EXAM: ULTRASOUND ABDOMEN COMPLETE  COMPARISON:  Abdominal pelvic CT 07/19/2013.  FINDINGS: Examination is mildly limited by body habitus.  Gallbladder:  Status post cholecystectomy.  Common bile duct:  Diameter:  8.6 mm. This appears increased from prior CT. No intraductal stones demonstrated.  Liver:  Mildly increased echogenicity.  No focal abnormality apparent.  IVC:  No abnormality visualized.  Pancreas:  Visualized portion unremarkable.  Spleen:  Size and appearance within normal limits.  Right Kidney:  Length: 12.8 cm. There is a probable small cyst in the interpolar region measuring 8 mm maximally. No suspicious cortical lesion or hydronephrosis.  Left Kidney:  Length: 11.8 cm. Echogenicity within normal limits. No mass or hydronephrosis visualized.  Abdominal aorta:  No aneurysm visualized.  Other findings:  None.  IMPRESSION: 1. Mild extrahepatic biliary dilatation status post cholecystectomy. Although increased from prior CT, laboratory tests today do not show evidence of biliary obstruction. 2. Probable mild hepatic steatosis.   Electronically Signed   By: Camie Patience M.D.   On: 02/20/2014 19:23   Nm Myocar Multi W/spect W/wall Motion / Ef  02/23/2014   CLINICAL DATA:  Chest pain.  EXAM: MYOCARDIAL IMAGING WITH SPECT (REST AND PHARMACOLOGIC-STRESS - 2 DAY PROTOCOL)  GATED LEFT VENTRICULAR WALL MOTION STUDY  LEFT VENTRICULAR EJECTION FRACTION  TECHNIQUE: Standard myocardial SPECT imaging was performed after resting intravenous injection of 30 mCi Tc-68msestamibi. Subsequently, on a second day, intravenous infusion of Lexiscan was performed under the supervision of the Cardiology staff. At peak effect of the drug, 30 mCi Tc-937mestamibi was injected intravenously and standard myocardial SPECT imaging was performed. Quantitative gated imaging was also performed to evaluate left ventricular wall motion, and estimate left ventricular ejection fraction.  COMPARISON:  None.  FINDINGS: Perfusion: There is a reversible anterior wall defect consistent with myocardial ischemia. Mild associated anterior wall motion abnormality.  Wall Motion: Mild anterior wall motion abnormality.  Left Ventricular Ejection Fraction: 39  %  End diastolic volume 14916l  End systolic volume 90 ml  IMPRESSION: 1. Anterior wall ischemia.  2. Mild anterior wall motion abnormality.  3. Left ventricular ejection fraction 39%  4. Intermediate-risk stress test findings*.  *2012 Appropriate Use Criteria for Coronary Revascularization Focused Update: J Am Coll Cardiol. 206060;04(5):997-741http://[email or web address]?[email or web address]   Electronically Signed   By: MaKalman Jewels.D.   On: 02/23/2014 13:29   Nm Pulmonary Perf And Vent  02/19/2014   CLINICAL DATA:  Chest pain, tachycardia, elevated D-dimer.  EXAM: NUCLEAR MEDICINE VENTILATION - PERFUSION LUNG SCAN  TECHNIQUE: Ventilation images were obtained in multiple projections using inhaled aerosol technetium 99 M DTPA. Perfusion images were obtained in multiple projections  after intravenous injection of Tc-44mMAA.  RADIOPHARMACEUTICALS:  40 mCi Tc-988mTPA aerosol and 6 mCi Tc-9930mA  COMPARISON:  Chest 02/19/2014, V/Q scan 12/30/2012.  FINDINGS: Ventilation: Mostly homogeneous distribution of tracer activity throughout the lungs without focal defect. Tracer activity also identified in the gastrointestinal tract.  Perfusion: No wedge shaped peripheral perfusion defects to suggest acute pulmonary embolism.  IMPRESSION: Very low probability of pulmonary embolus.   Electronically Signed   By: WilLucienne CapersD.   On: 02/19/2014 23:31       Microbiology: Recent Results (from the past 240 hour(s))  MRSA PCR SCREENING     Status: None   Collection Time    02/20/14 12:30 PM      Result Value Ref Range Status   MRSA by PCR NEGATIVE  NEGATIVE Final   Comment:            The GeneXpert MRSA Assay (FDA     approved for NASAL specimens     only), is one component of a     comprehensive MRSA colonization     surveillance program. It is not     intended to diagnose MRSA     infection nor to guide or     monitor treatment for     MRSA infections.     Labs: Results for  orders placed during the hospital encounter of 02/19/14 (from the past 48 hour(s))  COMPREHENSIVE METABOLIC PANEL     Status: Abnormal   Collection Time    02/25/14  2:20 PM      Result Value Ref Range   Sodium 138  137 - 147 mEq/L   Potassium 5.3  3.7 - 5.3 mEq/L   Chloride 98  96 - 112 mEq/L   CO2 30  19 - 32 mEq/L   Glucose, Bld 87  70 - 99 mg/dL   BUN 26 (*) 6 - 23 mg/dL   Creatinine, Ser 1.21 (*) 0.50 - 1.10 mg/dL   Calcium 10.0  8.4 - 10.5 mg/dL   Total Protein 8.5 (*) 6.0 - 8.3 g/dL   Albumin 4.2  3.5 - 5.2 g/dL   AST 29  0 - 37 U/L   ALT 20  0 - 35 U/L   Alkaline Phosphatase 123 (*) 39 - 117 U/L   Total Bilirubin 0.3  0.3 - 1.2 mg/dL   GFR calc non Af Amer 56 (*) >90 mL/min   GFR calc Af Amer 64 (*) >90 mL/min   Comment: (NOTE)     The eGFR has been calculated using the CKD EPI equation.     This calculation has not been validated in all clinical situations.     eGFR's persistently <90 mL/min signify possible Chronic Kidney     Disease.   Anion gap 10  5 - 15  BASIC METABOLIC PANEL     Status: Abnormal   Collection Time    02/26/14  4:50 AM      Result Value Ref Range   Sodium 137  137 - 147 mEq/L   Potassium 5.5 (*) 3.7 - 5.3 mEq/L   Chloride 100  96 - 112 mEq/L   CO2 25  19 - 32 mEq/L   Glucose, Bld 122 (*) 70 - 99 mg/dL   BUN 23  6 - 23 mg/dL   Creatinine, Ser 1.02  0.50 - 1.10 mg/dL   Calcium 9.6  8.4 - 10.5 mg/dL   GFR calc non Af Amer 68 (*) >90 mL/min  GFR calc Af Amer 79 (*) >90 mL/min   Comment: (NOTE)     The eGFR has been calculated using the CKD EPI equation.     This calculation has not been validated in all clinical situations.     eGFR's persistently <90 mL/min signify possible Chronic Kidney     Disease.   Anion gap 12  5 - 15  CBC     Status: None   Collection Time    02/26/14  4:50 AM      Result Value Ref Range   WBC 8.5  4.0 - 10.5 K/uL   Comment: WHITE COUNT CONFIRMED ON SMEAR   RBC 4.45  3.87 - 5.11 MIL/uL   Hemoglobin 12.8  12.0 -  15.0 g/dL   HCT 29.2  90.9 - 03.0 %   MCV 88.5  78.0 - 100.0 fL   MCH 28.8  26.0 - 34.0 pg   MCHC 32.5  30.0 - 36.0 g/dL   RDW 14.9  96.9 - 24.9 %   Platelets 291  150 - 400 K/uL   Comment: PLATELET CLUMPS NOTED ON SMEAR, COUNT APPEARS ADEQUATE  PROTIME-INR     Status: None   Collection Time    02/26/14  4:50 AM      Result Value Ref Range   Prothrombin Time 12.5  11.6 - 15.2 seconds   INR 0.93  0.00 - 1.49   2-D echo results No significant change when compared to the study from 10/25/2013. There is now grade 2 diastolic dysfunction (previosult grade 3).      HPI :  38 yo woman with a PMH of hypertension, prior DVT ('14) and postpartum cardiomyopathy dating back 15 years with 6/15 EF of 35-40% with restrictive filling who presents from cardiac rehabilitation for epigastric chest discomfort. She's had the symptoms for 10 hours and initially the pain initially woke her from sleep. She has no associated shortness of breath. During cardiac rehabilitation the chest/epipastrium discomfort was slightly worse leading to ER transfer/presentation. Initial troponin negative and d-dimer elevated. She tells me she felt worse and then better after the GI cocktail. The pain has improved mildly since this AM but been mainly persistent. She was initially in atrial fibrillation and now in sinus rhythm.   HOSPITAL COURSE:  Chest pain  Atypical symptoms, low suspicion for acute pericarditis  Telemetry negative,Cardiac rehab strips reviewed and only showed NSR/ST with occ PVCs, TSH normal, no documentation of paroxysmal atrial fibrillation found  VQ scan low probability  Likely secondary to esophageal/GI etiology  2 day stress test due to morbid obesity,  Echocardiogram shows ejection fraction 35-40% unchanged.  - Final results of nuclear study show Anterior wall ischemia .  Notified Dr Jens Som , precath Prednisone given x two for contrast allergy Cardiac cath showed the following 1. Normal left main  coronary artery. 2. Normal left anterior descending artery and its branches. 3. Normal left circumflex artery and its branches. 4. Normal right coronary artery. 5. Left ventricular systolic function not assessed. LVEDP 16 mmHg.   Continue aggressive preventive therapy and risk factor modification. Cardiology followup with Dr. Jens Som    Questionable paroxysmal atrial fibrillation  May need an outpatient event monitor cardiology to arrange for this  Followup with cardiology outpatient, Dr. Jens Som  Peripartum cardiomyopathy with systolic heart failure  Repeat 2-D echo done as above  - on spironolactone 25 mg daily, coreg 25 mg bid, losartan 50 mg daily, lasix 80 mg po bid    Elevated d-dimer:  V/Q negative    Hypertension: continue HF medications as above   insomnia  Restart Valium     Discharge Exam:  Blood pressure 124/61, pulse 90, temperature 98 F (36.7 C), temperature source Oral, resp. rate 18, height _0  (1.651 m), weight 113.263 kg (249 lb 11.2 oz), last menstrual period 02/18/2014, SpO2 97.00%. General appearance: alert, cooperative and no distress  Lungs: clear to auscultation bilaterally  Heart: regular rate and rhythm, S1, S2 normal, no murmur, click, rub or gallop  Extremities: No LEE  Pulses: 2+ and symmetric  Skin: Warm and dry  Neurologic: Grossly normal            Discharge Instructions   Diet - low sodium heart healthy    Complete by:  As directed      Increase activity slowly    Complete by:  As directed            Follow-up Information   Follow up with [name], MD. Schedule an appointment as soon as possible for a visit in 1 week.   Specialty:  Internal Medicine   Contact information:   [address] [place] [address] [phone]       Follow up with Kirk Ruths, MD. Schedule an appointment as soon as possible for a visit in 1 week.   Specialty:  Cardiology   Contact information:    [address] [place] [address] [phone]       Signed: Reyne Dumas 02/26/2014, 11:14 AM

## 2014-02-26 NOTE — Progress Notes (Signed)
Vascular access armband removed by RN. Patient observed for 45 minutes. Pt and spouse verbalized understanding of care of the access site after discharge.

## 2014-02-26 NOTE — Progress Notes (Signed)
RT spoke to patient in regards to wearing her CPAP and the patient stated that she didn't need any assistance putting the mask on at this time.  Patients CPAP is on auto titrate of 20 maximum and 5 minimum.  RT advised patient if there was anything she needed for the CPAP to give respiratory a call.

## 2014-02-28 ENCOUNTER — Encounter (HOSPITAL_COMMUNITY): Admission: RE | Admit: 2014-02-28 | Payer: 59 | Source: Ambulatory Visit

## 2014-03-02 ENCOUNTER — Telehealth: Payer: Self-pay | Admitting: Cardiology

## 2014-03-02 ENCOUNTER — Encounter (HOSPITAL_COMMUNITY)
Admission: RE | Admit: 2014-03-02 | Discharge: 2014-03-02 | Disposition: A | Discharge status: Home / Self Care | Payer: 59 | Source: Ambulatory Visit | Attending: Cardiology | Admitting: Cardiology

## 2014-03-02 DIAGNOSIS — Z5189 Encounter for other specified aftercare: Secondary | ICD-10-CM | POA: Insufficient documentation

## 2014-03-02 DIAGNOSIS — I429 Cardiomyopathy, unspecified: Secondary | ICD-10-CM | POA: Diagnosis not present

## 2014-03-02 DIAGNOSIS — E669 Obesity, unspecified: Secondary | ICD-10-CM | POA: Diagnosis not present

## 2014-03-02 DIAGNOSIS — I10 Essential (primary) hypertension: Secondary | ICD-10-CM | POA: Diagnosis not present

## 2014-03-02 DIAGNOSIS — I509 Heart failure, unspecified: Secondary | ICD-10-CM | POA: Insufficient documentation

## 2014-03-02 NOTE — Telephone Encounter (Signed)
Needs to speak with Triage Nurse.

## 2014-03-02 NOTE — Telephone Encounter (Signed)
New message           Pt just had a recent cath and would like to know if she can take ibuprophen

## 2014-03-02 NOTE — Progress Notes (Signed)
Cassadee returned to exercise at cardiac rehab and exercised without difficulty. Cristela will check with Dr Jacalyn Lefevre office whether she can take ibuprofen or not. Javonne is having a root canal on Tuesday.

## 2014-03-02 NOTE — Telephone Encounter (Signed)
Called no answer.  patient  called in as well. Awaiting answer.

## 2014-03-02 NOTE — Telephone Encounter (Signed)
REVIEW WITH Cecilie Kicks NP OKAY TO USE IBUPROFEN, IT MAY UPSET STOMACH , TAKE WITH FOOD IF NEEDED PATIENT AWARE

## 2014-03-02 NOTE — Telephone Encounter (Signed)
PATIENT AWARE

## 2014-03-02 NOTE — Telephone Encounter (Signed)
PATIENT STATES SHE IS HAVING A ROOT CANAL ON Tuesday- DENTIST GAVE HER HYDROCODONE AND TOLD HER TO  SUPPLEMENT WITH IBUPROFEN. [place] INSTRUCTED HER TO STOP TAKING EXCEDRIN AND IBUPROFEN.  WILL REVIEW WITH D.0.D/OR EXTENDER.

## 2014-03-02 NOTE — Telephone Encounter (Signed)
Needs to find out if patient can take Ibuprofen  Call back # [phone]

## 2014-03-05 ENCOUNTER — Encounter: Payer: Self-pay | Admitting: Internal Medicine

## 2014-03-05 ENCOUNTER — Ambulatory Visit: Payer: 59 | Admitting: Internal Medicine

## 2014-03-05 ENCOUNTER — Encounter (HOSPITAL_COMMUNITY): Payer: 59

## 2014-03-05 ENCOUNTER — Telehealth: Payer: Self-pay | Admitting: Internal Medicine

## 2014-03-05 ENCOUNTER — Other Ambulatory Visit (INDEPENDENT_AMBULATORY_CARE_PROVIDER_SITE_OTHER): Payer: 59

## 2014-03-05 ENCOUNTER — Ambulatory Visit (INDEPENDENT_AMBULATORY_CARE_PROVIDER_SITE_OTHER): Payer: 59 | Admitting: Internal Medicine

## 2014-03-05 VITALS — BP 132/82 | HR 85 | Temp 98.0°F | Ht 65.0 in | Wt 251.5 lb

## 2014-03-05 DIAGNOSIS — I10 Essential (primary) hypertension: Secondary | ICD-10-CM

## 2014-03-05 DIAGNOSIS — I5042 Chronic combined systolic (congestive) and diastolic (congestive) heart failure: Secondary | ICD-10-CM | POA: Insufficient documentation

## 2014-03-05 DIAGNOSIS — K589 Irritable bowel syndrome without diarrhea: Secondary | ICD-10-CM

## 2014-03-05 DIAGNOSIS — I5032 Chronic diastolic (congestive) heart failure: Secondary | ICD-10-CM

## 2014-03-05 HISTORY — DX: Chronic combined systolic (congestive) and diastolic (congestive) heart failure: I50.42

## 2014-03-05 LAB — BASIC METABOLIC PANEL
BUN: 9 mg/dL (ref 6–23)
CO2: 26 mEq/L (ref 19–32)
Calcium: 9.6 mg/dL (ref 8.4–10.5)
Chloride: 100 mEq/L (ref 96–112)
Creatinine, Ser: 0.9 mg/dL (ref 0.4–1.2)
GFR: 85.9 mL/min (ref >60.00)
Glucose, Bld: 96 mg/dL (ref 70–99)
Potassium: 4.3 mEq/L (ref 3.5–5.1)
Sodium: 138 mEq/L (ref 135–145)

## 2014-03-05 MED ORDER — COLESEVELAM HCL 625 MG PO TABS: 625.0000 mg | ORAL_TABLET | Freq: Three times a day (TID) | ORAL | Status: DC

## 2014-03-05 NOTE — Patient Instructions (Signed)
It was good to see you today.  We have reviewed your prior records including labs and tests today  Test(s) ordered today. Your results will be released to [place] (or called to you) after review, usually within 72hours after test completion. If any changes need to be made, you will be notified at that same time.  Medications reviewed and updated, no changes recommended at this time. Refill on medication(s) as discussed today.  Continue working with cardiology and rehab as ongoing  Please schedule followup in 6 months, call sooner if problems.

## 2014-03-05 NOTE — Progress Notes (Signed)
Pre visit review using our clinic review tool, if applicable. No additional management support is needed unless otherwise documented below in the visit note. 

## 2014-03-05 NOTE — Telephone Encounter (Signed)
Ivin Booty called stated Mrs. [employer] approve for [employer] but it need to be 90 days supply. Please send in Rx for Welchol 90 days to [facility].

## 2014-03-05 NOTE — Assessment & Plan Note (Signed)
BP Readings from Last 3 Encounters:  03/05/14 132/82  02/26/14 110/61  02/26/14 110/61   Resistant to med tx On ARB, spironolactone, coreg, and lasix since 02/2014 hosp for CHF [facility] The current medical regimen is effective;  continue present plan and medications

## 2014-03-05 NOTE — Assessment & Plan Note (Signed)
hosp for [facility] of same 02/2014 - reviewed cardiac meds and monitor weights daily with BP via insurance supplies at home No edema; stable dyspnea on exertion Working with cardiology on same - in cardiac rehab On coreg, lasix, spironolactone and ARB -  Continue low Na diet and BP control

## 2014-03-05 NOTE — Assessment & Plan Note (Signed)
Severe alternating diarrhea with constipation symptoms  On welchol prn - no new change in symptoms - refill today as no longer following with GI ([facility]) Also stool softener qhs

## 2014-03-05 NOTE — Progress Notes (Signed)
Subjective:    Patient ID: [name], female    DOB: February 26, 1976, 39 y.o.   MRN: [id number]  HPI  Patient is here for follow up  Reviewed chronic medical issues and interval medical events  Past Medical History  Diagnosis Date  . Hypertension   . Ovarian cyst     1999; surgically removed  . OSA (obstructive sleep apnea)     on CPAP   . IBS (irritable bowel syndrome)   . 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 Depression   . Anxiety   . Fibroid     age 19  . Termination of pregnancy     due to cardiac risk  . DVT (deep venous thrombosis)     2014  . CHF (congestive heart failure)   . Gallstones     Review of Systems     Objective:   Physical Exam  BP 132/82  Pulse 85  Temp(Src) 98 F (36.7 C) (Oral)  Ht 5\' 5"  (1.651 m)  Wt 251 lb 8 oz (114.08 kg)  BMI 41.85 kg/m2  SpO2 98%  LMP 02/18/2014 Wt Readings from Last 3 Encounters:  03/05/14 251 lb 8 oz (114.08 kg)  02/26/14 249 lb 11.2 oz (113.263 kg)  02/26/14 249 lb 11.2 oz (113.263 kg)   Constitutional: She is MO, appears well-developed and well-nourished. No distress.  Neck: Normal range of motion. Neck supple. No JVD present. No thyromegaly present.  Cardiovascular: Normal rate, regular rhythm and normal heart sounds.  No murmur heard. No BLE edema. Pulmonary/Chest: Effort normal and breath sounds normal. No respiratory distress. She has no wheezes.  Psychiatric: She has a normal mood and affect. Her behavior is normal. Judgment and thought content normal.   Lab Results  Component Value Date   WBC 8.5 02/26/2014   HGB 12.8 02/26/2014   HCT 39.4 02/26/2014   PLT 291 02/26/2014   GLUCOSE 122* 02/26/2014   CHOL 144 02/15/2013   TRIG 83.0 02/15/2013   HDL 43.30 02/15/2013   LDLCALC 84 02/15/2013   ALT 20 02/25/2014   AST 29 02/25/2014   NA 137 02/26/2014   K 5.5* 02/26/2014   CL 100 02/26/2014   CREATININE 1.02 02/26/2014   BUN 23 02/26/2014   CO2 25 02/26/2014   TSH 2.280 02/20/2014   INR 0.93 02/26/2014    No results found.     Assessment & Plan:   Problem List Items Addressed This Visit   Chronic diastolic heart failure - Primary     hosp for [facility] of same 02/2014 - reviewed cardiac meds and monitor weights daily with BP via insurance supplies at home No edema; stable dyspnea on exertion Working with cardiology on same - in cardiac rehab On coreg, lasix, spironolactone and ARB -  Continue low Na diet and BP control    Relevant Medications      colesevelam (WELCHOL) 625 MG tablet   Other Relevant Orders      Basic metabolic panel   Hypertension      BP Readings from Last 3 Encounters:  03/05/14 132/82  02/26/14 110/61  02/26/14 110/61   Resistant to med tx On ARB, spironolactone, coreg, and lasix since 02/2014 hosp for [facility] The current medical regimen is effective;  continue present plan and medications    Relevant Medications      colesevelam (WELCHOL) 625 MG tablet   Other Relevant Orders      Basic metabolic panel  IBS (irritable bowel syndrome)     Severe alternating diarrhea with constipation symptoms  On welchol prn - no new change in symptoms - refill today as no longer following with GI ([facility]) Also stool softener qhs

## 2014-03-06 MED ORDER — COLESEVELAM HCL 625 MG PO TABS: 625.0000 mg | ORAL_TABLET | Freq: Three times a day (TID) | ORAL | Status: DC

## 2014-03-06 MED ORDER — FLUCONAZOLE 150 MG PO TABS: 150.0000 mg | ORAL_TABLET | Freq: Once | ORAL | Status: DC

## 2014-03-06 NOTE — Telephone Encounter (Signed)
[facility].../[facility]mb

## 2014-03-07 ENCOUNTER — Encounter (HOSPITAL_COMMUNITY): Payer: 59

## 2014-03-07 ENCOUNTER — Ambulatory Visit (INDEPENDENT_AMBULATORY_CARE_PROVIDER_SITE_OTHER): Payer: 59 | Admitting: Cardiology

## 2014-03-07 ENCOUNTER — Encounter: Payer: Self-pay | Admitting: Cardiology

## 2014-03-07 VITALS — BP 135/85 | HR 95 | Ht 65.0 in | Wt 257.0 lb

## 2014-03-07 DIAGNOSIS — Z9889 Other specified postprocedural states: Secondary | ICD-10-CM

## 2014-03-07 DIAGNOSIS — I10 Essential (primary) hypertension: Secondary | ICD-10-CM

## 2014-03-07 DIAGNOSIS — G4733 Obstructive sleep apnea (adult) (pediatric): Secondary | ICD-10-CM

## 2014-03-07 DIAGNOSIS — I5032 Chronic diastolic (congestive) heart failure: Secondary | ICD-10-CM

## 2014-03-07 NOTE — Patient Instructions (Signed)
Your physician recommends that you schedule a follow-up appointment in: 6 Months with Dr Stanford Breed

## 2014-03-07 NOTE — Progress Notes (Signed)
03/09/2014   PCP: [name], MD   Chief Complaint  Patient presents with  . Follow-up    post hospital for chest pain, pt denied SOB and chest pain    Primary Cardiologist:Dr. 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  She had presented from cardiac rehabilitation for epigastric chest discomfort.  1. Chest pain-Symptoms unlikely to be cardiac based on description.  - r/o for MI, VQ scan negative for PE  - Echocardiogram shows ejection fraction 35-40% unchanged.  -  nuclear study pending was abnormal and she underwent cardiac cath.  Her coronary arteries were normal LVEDP 16 mHg. 2. 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  3. Sinus tachycardia  - TSH normal  - there is mention of "brief atrial fibrillation" in fellow note - cannot find any evidence of this on ECGs or rhythm strips  4. Hypertension  - continue HF medications as above   Today she has no complaints feels well.  Actually some of her pain may have been due to tooth infection and she had a root canal yesterday.  She is on antibiotics.  She continues to use her CPAP.     BMET    Component Value Date/Time   NA 138 03/05/2014 1203   K 4.3 03/05/2014 1203   CL 100 03/05/2014 1203   CO2 26 03/05/2014 1203   GLUCOSE 96 03/05/2014 1203   BUN 9 03/05/2014 1203   CREATININE 0.9 03/05/2014 1203   CREATININE 0.78 01/18/2014 1621   CALCIUM 9.6 03/05/2014 1203   GFRNONAA 68* 02/26/2014 0450   GFRNONAA >89 01/18/2014 1621   GFRAA 79* 02/26/2014 0450   GFRAA >89 01/18/2014 1621        Allergies  Allergen Reactions  . Ivp Dye [Iodinated Diagnostic Agents] Other (See Comments)    Did something to my liver and kidneys told not to use it  .  Vancomycin Other (See Comments)    "did something to my kidneys"; went into kidney failure  . Aspirin Other (See Comments)    Wheezing; but patient still takes if she needs to  . Ciprofloxacin Itching and Rash  . Sulfa Antibiotics Itching and Rash    Current Outpatient Prescriptions  Medication Sig Dispense Refill  . amoxicillin (AMOXIL) 500 MG tablet Take 875 mg by mouth 2 (two) times daily. 2 tabs PO BID      . Armodafinil (NUVIGIL) 250 MG tablet Take 250 mg by mouth daily as needed (when using CPAP and still short of breath).       . carvedilol (COREG) 25 MG tablet Take 1 tablet (25 mg total) by mouth 2 (two) times daily with a meal.  180 tablet  1  . colesevelam (WELCHOL) 625 MG tablet Take 1 tablet (625 mg total) by mouth 3 (three) times daily with meals.  270 tablet  1  . cyclobenzaprine (FLEXERIL) 10 MG tablet Take 1 tablet (10 mg total) by mouth 3 (three) times daily as needed for muscle spasms.  30 tablet  0  . diazepam (VALIUM) 10 MG tablet Take 10 mg by mouth at bedtime as needed for anxiety.      . dicyclomine (BENTYL) 10 MG capsule Take 10  mg by mouth 3 (three) times daily as needed for spasms.      . fluconazole (DIFLUCAN) 150 MG tablet Take 250 mg by mouth once. May repeat dose next day if needed      . fluticasone (FLONASE) 50 MCG/ACT nasal spray Place 1 spray into both nostrils at bedtime. Before donning CPAP  16 g  2  . furosemide (LASIX) 40 MG tablet Take 2 tablets (80 mg total) by mouth 2 (two) times daily.  120 tablet  12  . gabapentin (NEURONTIN) 100 MG capsule Take 1 capsule (100 mg total) by mouth 2 (two) times daily.  180 capsule  0  . gabapentin (NEURONTIN) 300 MG capsule Take 300 mg by mouth at bedtime. Daily at bedtime.      Marland Kitchen HYDROcodone-acetaminophen (NORCO/VICODIN) 5-325 MG per tablet Take 1 tablet by mouth every 4 (four) hours.      Marland Kitchen losartan (COZAAR) 50 MG tablet Take 1 tablet (50 mg total) by mouth daily.  90 tablet  3  . methylPREDNIsolone (MEDROL DOSPACK)  4 MG tablet As needed for swelling      . pantoprazole (PROTONIX) 20 MG tablet Take 1 tablet (20 mg total) by mouth 2 (two) times daily.  60 tablet  6  . potassium chloride SA (K-DUR,KLOR-CON) 20 MEQ tablet Take 2 tablets (40 mEq total) by mouth 2 (two) times daily.  124 tablet  12  . Prenatal Multivit-Min-Fe-FA (PRENATAL 1 + IRON PO) Take 1 tablet by mouth.      . Skin Protectants, Misc. (EUCERIN) cream Apply 1 application topically as needed for dry skin.      Marland Kitchen spironolactone (ALDACTONE) 25 MG tablet Take 1 tablet (25 mg total) by mouth daily.  90 tablet  3  . sucralfate (CARAFATE) 1 G tablet Take 1 g by mouth daily.      . Vilazodone HCl (VIIBRYD) 40 MG TABS Take 40 mg by mouth daily.      . Vitamin D, Ergocalciferol, (DRISDOL) 50000 UNITS CAPS capsule Take 50,000 Units by mouth every 7 (seven) days.       No current facility-administered medications for this visit.    Past Medical History  Diagnosis Date  . Hypertension   . Ovarian cyst     1999; surgically removed  . OSA (obstructive sleep apnea)     on CPAP   . IBS (irritable bowel syndrome)   . 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 Depression   . Anxiety   . Fibroid     age 32  . Termination of pregnancy     due to cardiac risk  . DVT (deep venous thrombosis)     2014  . CHF (congestive heart failure)   . Gallstones     Past Surgical History  Procedure Laterality Date  . Abdominal surgery      several  . Cesarean section    . Cholecystectomy    . Foot surgery Right   . Tubal ligation  1999    UXL:KGMWNUU:VO colds or fevers, continued weight increase Skin:no rashes or ulcers HEENT:no blurred vision, no congestion CV:see HPI PUL:see HPI GI:no diarrhea constipation or melena, no indigestion GU:no hematuria, no dysuria MS:no joint pain, no claudication Neuro:no syncope, no lightheadedness Endo:no diabetes, no thyroid disease  Wt Readings from Last 3 Encounters:  03/07/14 257 lb  (116.574 kg)  03/05/14 251 lb 8 oz (114.08 kg)  02/26/14 249 lb 11.2 oz (113.263 kg)    PHYSICAL  EXAM BP 135/85  Pulse 95  Ht 5\' 5"  (1.651 m)  Wt 257 lb (116.574 kg)  BMI 42.77 kg/m2  LMP 02/18/2014 General:Pleasant affect, NAD Skin:Warm and dry, brisk capillary refill HEENT:normocephalic, sclera clear, mucus membranes moist Neck:supple, no JVD, no bruits  Heart:S1S2 RRR without murmur, gallup, rub or click Lungs:clear without rales, rhonchi, or wheezes SWH:QPRF, non tender, + BS, do not palpate liver spleen or masses Ext:no lower ext edema, 2+ pedal pulses, 2+ radial pulses Neuro:alert and oriented, MAE, follows commands, + facial symmetry  EKG:SR rate of 97, T wave abnormality Q wave in V40-3- old.  ASSESSMENT AND PLAN Chronic diastolic heart failure stable  Hypertension controlled  Obesity, morbid, BMI 40.0-49.9 Aware of need to loose wt.  OSA (obstructive sleep apnea) Wears her cpap  S/P cardiac cath Patent coronary arteries. Done for chest pain and abnormal nuc stress test.   Follow up with Dr. Stanford Breed in 6 months.

## 2014-03-08 ENCOUNTER — Ambulatory Visit: Payer: 59 | Admitting: Family Medicine

## 2014-03-09 ENCOUNTER — Encounter (HOSPITAL_COMMUNITY): Payer: 59

## 2014-03-09 DIAGNOSIS — Z9889 Other specified postprocedural states: Secondary | ICD-10-CM | POA: Insufficient documentation

## 2014-03-09 NOTE — Assessment & Plan Note (Signed)
stable °

## 2014-03-09 NOTE — Assessment & Plan Note (Signed)
Wears her cpap

## 2014-03-09 NOTE — Assessment & Plan Note (Signed)
controlled 

## 2014-03-09 NOTE — Assessment & Plan Note (Signed)
Aware of need to loose wt.

## 2014-03-09 NOTE — Assessment & Plan Note (Signed)
Patent coronary arteries. Done for chest pain and abnormal nuc stress test.

## 2014-03-12 ENCOUNTER — Ambulatory Visit: Payer: 59 | Admitting: Cardiology

## 2014-03-12 ENCOUNTER — Encounter (HOSPITAL_COMMUNITY)
Admission: RE | Admit: 2014-03-12 | Discharge: 2014-03-12 | Disposition: A | Discharge status: Home / Self Care | Payer: 59 | Source: Ambulatory Visit | Attending: Cardiology | Admitting: Cardiology

## 2014-03-12 DIAGNOSIS — Z5189 Encounter for other specified aftercare: Secondary | ICD-10-CM | POA: Diagnosis not present

## 2014-03-14 ENCOUNTER — Encounter (HOSPITAL_COMMUNITY): Payer: 59

## 2014-03-19 ENCOUNTER — Encounter (HOSPITAL_COMMUNITY): Payer: 59

## 2014-03-21 ENCOUNTER — Encounter (HOSPITAL_COMMUNITY): Payer: 59

## 2014-03-26 ENCOUNTER — Encounter: Payer: Self-pay | Admitting: Cardiology

## 2014-03-26 ENCOUNTER — Encounter (HOSPITAL_COMMUNITY): Payer: 59

## 2014-03-27 ENCOUNTER — Telehealth (HOSPITAL_COMMUNITY): Payer: Self-pay | Admitting: *Deleted

## 2014-03-28 ENCOUNTER — Encounter (HOSPITAL_COMMUNITY): Payer: 59

## 2014-04-03 ENCOUNTER — Telehealth (HOSPITAL_COMMUNITY): Payer: Self-pay | Admitting: *Deleted

## 2014-04-09 ENCOUNTER — Other Ambulatory Visit: Payer: Self-pay | Admitting: Family Medicine

## 2014-04-12 ENCOUNTER — Ambulatory Visit: Payer: Self-pay | Admitting: Family Medicine

## 2014-04-17 ENCOUNTER — Ambulatory Visit: Payer: Self-pay | Admitting: Family Medicine

## 2014-04-18 ENCOUNTER — Ambulatory Visit: Payer: Self-pay | Admitting: Family Medicine

## 2014-05-03 ENCOUNTER — Encounter (HOSPITAL_COMMUNITY): Payer: Self-pay | Admitting: Interventional Cardiology

## 2014-05-03 ENCOUNTER — Other Ambulatory Visit: Payer: Self-pay | Admitting: Internal Medicine

## 2014-06-21 ENCOUNTER — Encounter: Payer: Self-pay | Admitting: Cardiology

## 2014-06-25 ENCOUNTER — Ambulatory Visit: Payer: Self-pay | Admitting: Internal Medicine

## 2014-07-03 ENCOUNTER — Ambulatory Visit: Payer: Self-pay | Admitting: Family Medicine

## 2014-07-04 ENCOUNTER — Ambulatory Visit: Payer: Self-pay | Admitting: Internal Medicine

## 2014-07-04 ENCOUNTER — Telehealth: Payer: Self-pay | Admitting: Internal Medicine

## 2014-07-04 NOTE — Telephone Encounter (Signed)
Pt N/S for requested visit via My Chart for insomnia and basic check up.  Do we need to contact pt to R/S this visit?  Please advise.

## 2014-07-16 ENCOUNTER — Encounter: Payer: Self-pay | Admitting: Family Medicine

## 2014-07-16 ENCOUNTER — Ambulatory Visit (INDEPENDENT_AMBULATORY_CARE_PROVIDER_SITE_OTHER): Payer: 59 | Admitting: Family Medicine

## 2014-07-16 VITALS — BP 114/80 | HR 112 | Ht 65.0 in | Wt 256.0 lb

## 2014-07-16 DIAGNOSIS — M7631 Iliotibial band syndrome, right leg: Secondary | ICD-10-CM | POA: Insufficient documentation

## 2014-07-16 DIAGNOSIS — M7550 Bursitis of unspecified shoulder: Secondary | ICD-10-CM | POA: Insufficient documentation

## 2014-07-16 DIAGNOSIS — M7551 Bursitis of right shoulder: Secondary | ICD-10-CM

## 2014-07-16 NOTE — Assessment & Plan Note (Signed)
Patient was given an injection today. We discussed icing regimen home exercises as well as topical anti-inflammatories. Patient come back in 3 weeks for further evaluation and treatment.

## 2014-07-16 NOTE — Progress Notes (Signed)
[name] [address] [place], [place] [address] Phone: [phone] Subjective:     CC: Right shoulder pain, right knee pain  JQB:[id number] [name] is a 39 y.o. female coming in with complaint of right shoulder and right knee pain.   [name] right shoulder pain, patient has had a history of a subacromial bursitis previously. Patient has had an injection greater than over a year ago. Patient states it was doing very well but unfortunately with sleeping in an uncomfortable position approximately 3 weeks ago and since then is cut having a dull throbbing aching sensation that seems very similar to her previous presentation. Patient rates the severity of 5 out of 10. Stopping her from some mild activities throughout the day. States that he can wake her up at night. Denies any history of neck pain that seems to be associated with it.  Patient is also having a new problem. Patient is having more of right-sided knee pain. States more on the lateral aspect of the knee. Remembers initial injury with possible auto pop but my to been hurting her previous to this. Patient states this seems to be localized on the lateral aspect and just above the knee. Patient states it hurts with certain movements. Denies any radiation past the knee. Denies any weakness or numbness. Patient rates the severity of pain a 6 out of 10. Has not tried any home modalities at this time.     Past medical history, social, surgical and family history all reviewed in electronic medical record.   Review of Systems: No headache, visual changes, nausea, vomiting, diarrhea, constipation, dizziness, abdominal pain, skin rash, fevers, chills, night sweats, weight loss, swollen lymph nodes, body aches, joint swelling, muscle aches, chest pain, shortness of breath, mood changes.   Objective Blood pressure 114/80, pulse 112, height 5\' 5"  (1.651 m), weight 256 lb (116.121 kg), SpO2 96 %.  General: No  apparent distress alert and oriented x3 mood and affect normal, dressed appropriately.  HEENT: Pupils equal, extraocular movements intact  Respiratory: [name] speak in full sentences and does not appear short of breath  Cardiovascular: No lower extremity edema, non tender, no erythema  Skin: Warm dry intact with no signs of infection or rash on extremities or on axial skeleton.  Abdomen: Soft nontender  Neuro: Cranial nerves II through XII are intact, neurovascularly intact in all extremities with 2+ DTRs and 2+ pulses.  Lymph: No lymphadenopathy of posterior or anterior cervical chain or axillae bilaterally.  Gait normal with good balance and coordination.  MSK:  Non tender with full range of motion and good stability and symmetric strength and tone of  elbows, wrist, hip, and ankles bilaterally.  Knee: Right Normal to inspection with no erythema or effusion or obvious bony abnormalities. ROM full in flexion and extension and lower leg rotation. Ligaments with solid consistent endpoints including ACL, PCL, LCL, MCL. Tenderness over the distal iliotibial band Negative Mcmurray's, Apley's, and Thessalonian tests. Non painful patellar compression. Patellar glide without crepitus. Patellar and quadriceps tendons unremarkable. Hamstring and quadriceps strength is normal.  Contralateral knee unremarkable  Shoulder: Right Inspection reveals no abnormalities, atrophy or asymmetry. Palpation is normal with no tenderness over AC joint or bicipital groove. ROM is full in all planes passively. Rotator cuff strength normal throughout. signs of impingement with positive Neer and Hawkin's tests, but negative empty can sign. Speeds and Yergason's tests normal. No labral pathology noted with negative Obrien's, negative clunk and good stability. Normal  scapular function observed. No painful arc and no drop arm sign. No apprehension sign Contralateral shoulder unremarkable  MSK US performed of:  Right This study was ordered, performed, and interpreted by Charlann Boxer D.O.  Shoulder:   Supraspinatus:  Appears normal on long and transverse views, Bursal bulge seen with shoulder abduction on impingement view. Infraspinatus:  Appears normal on long and transverse views. Significant increase in Doppler flow Subscapularis:  Appears normal on long and transverse views. Positive bursa Teres Minor:  Appears normal on long and transverse views. AC joint:  Capsule undistended, no geyser sign. Glenohumeral Joint:  Appears normal without effusion. Glenoid Labrum:  Intact without visualized tears. Biceps Tendon:  Appears normal on long and transverse views, no fraying of tendon, tendon located in intertubercular groove, no subluxation with shoulder internal or external rotation.  Impression: Subacromial bursitis  Procedure: Real-time Ultrasound Guided Injection of right glenohumeral joint Device: GE Logiq E  Ultrasound guided injection is preferred based studies that show increased duration, increased effect, greater accuracy, decreased procedural pain, increased response rate with ultrasound guided versus blind injection.  Verbal informed consent obtained.  Time-out conducted.  Noted no overlying erythema, induration, or other signs of local infection.  Skin prepped in a sterile fashion.  Local anesthesia: Topical Ethyl chloride.  With sterile technique and under real time ultrasound guidance:  Joint visualized.  23g 1  inch needle inserted posterior approach. Pictures taken for needle placement. Patient did have injection of 2 cc of 1% lidocaine, 2 cc of 0.5% Marcaine, and 1.0 cc of Kenalog 40 mg/dL. Completed without difficulty  Pain immediately resolved suggesting accurate placement of the medication.  Advised to call if fevers/chills, erythema, induration, drainage, or persistent bleeding.  Images permanently stored and available for review in the ultrasound unit.  Impression: Technically  successful ultrasound guided injection.  Procedure note 80165; 15 minutes spent for Therapeutic exercises as stated in above notes.  This included exercises focusing on stretching, strengthening, with significant focus on eccentric aspects.   Proper technique shown and discussed handout in great detail with ATC.  All questions were discussed and answered.     Impression and Recommendations:     This case required medical decision making of moderate complexity.

## 2014-07-16 NOTE — Assessment & Plan Note (Signed)
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  Given rehab program  - primarily ITB stretching program, core stability program, gluteus medius strengthening, hip flexion strengthening, and core. Additional proprioception and mild plyometrics program reviewed.  Reviewed plan of care and rehab is the primary treatment in this condition. Patient work with a Clinical research associate today as well. We discussed topical anti-inflammatories as well as icing. We discussed the importance of hip abductor strengthening.  Tourniquet clinic again in 3 weeks.

## 2014-07-16 NOTE — Progress Notes (Signed)
Pre visit review using our clinic review tool, if applicable. No additional management support is needed unless otherwise documented below in the visit note. 

## 2014-07-16 NOTE — Patient Instructions (Signed)
I am so sorry for your daughter, I hop eshe bounce backs.  Ice 20 minutes 2 times daily. Usually after activity and before bed. Exercises 3 times a week. Alternate shoulder and knee Vitamin D 2000 IU daily Turmeric 500mg  twice daily Pennsaid twice daily.  Duexis 3 times daily for 3 days See me again in 3 weeks.

## 2014-07-23 ENCOUNTER — Other Ambulatory Visit: Payer: Self-pay | Admitting: Family Medicine

## 2014-07-24 NOTE — Telephone Encounter (Signed)
Refill done.  

## 2014-08-16 ENCOUNTER — Ambulatory Visit: Payer: 59 | Admitting: Family Medicine

## 2014-08-20 ENCOUNTER — Encounter: Payer: Self-pay | Admitting: Internal Medicine

## 2014-08-20 ENCOUNTER — Ambulatory Visit (INDEPENDENT_AMBULATORY_CARE_PROVIDER_SITE_OTHER): Payer: 59 | Admitting: Internal Medicine

## 2014-08-20 ENCOUNTER — Ambulatory Visit (INDEPENDENT_AMBULATORY_CARE_PROVIDER_SITE_OTHER): Payer: 59 | Admitting: Family Medicine

## 2014-08-20 ENCOUNTER — Encounter: Payer: Self-pay | Admitting: Family Medicine

## 2014-08-20 VITALS — BP 138/100 | HR 99 | Ht 65.0 in | Wt 254.0 lb

## 2014-08-20 VITALS — BP 138/100 | HR 99 | Temp 98.2°F | Resp 16 | Ht 65.0 in | Wt 254.8 lb

## 2014-08-20 DIAGNOSIS — N938 Other specified abnormal uterine and vaginal bleeding: Secondary | ICD-10-CM | POA: Diagnosis not present

## 2014-08-20 DIAGNOSIS — M940 Chondrocostal junction syndrome [Tietze]: Secondary | ICD-10-CM | POA: Diagnosis not present

## 2014-08-20 DIAGNOSIS — M9902 Segmental and somatic dysfunction of thoracic region: Secondary | ICD-10-CM | POA: Diagnosis not present

## 2014-08-20 DIAGNOSIS — M7551 Bursitis of right shoulder: Secondary | ICD-10-CM | POA: Diagnosis not present

## 2014-08-20 DIAGNOSIS — M999 Biomechanical lesion, unspecified: Secondary | ICD-10-CM | POA: Insufficient documentation

## 2014-08-20 DIAGNOSIS — M9901 Segmental and somatic dysfunction of cervical region: Secondary | ICD-10-CM

## 2014-08-20 DIAGNOSIS — M7631 Iliotibial band syndrome, right leg: Secondary | ICD-10-CM

## 2014-08-20 DIAGNOSIS — M9908 Segmental and somatic dysfunction of rib cage: Secondary | ICD-10-CM

## 2014-08-20 HISTORY — DX: Biomechanical lesion, unspecified: M99.9

## 2014-08-20 NOTE — Patient Instructions (Signed)
The two most likely causes of the spotting are that you still have that fibroid that is bleeding some or that you are entering into the pre-menopause where you can have irregular periods.   I would recommend to get a PAP smear to check to make sure there are no problems there.   Generally fibroids get smaller when you enter menopause. Sometimes people have a lot of bleeding and need them surgically removed. I do not think you need that now.  If you are entering pre-menopause you could have irregular periods for several years before they stop altogether.   Perimenopause Perimenopause is the time when your body begins to move into the menopause (no menstrual period for 12 straight months). It is a natural process. Perimenopause can begin 2-8 years before the menopause and usually lasts for 1 year after the menopause. During this time, your ovaries may or may not produce an egg. The ovaries vary in their production of estrogen and progesterone hormones each month. This can cause irregular menstrual periods, difficulty getting pregnant, vaginal bleeding between periods, and uncomfortable symptoms. CAUSES  Irregular production of the ovarian hormones, estrogen and progesterone, and not ovulating every month.  Other causes include:  Tumor of the pituitary gland in the brain.  Medical disease that affects the ovaries.  Radiation treatment.  Chemotherapy.  Unknown causes.  Heavy smoking and excessive alcohol intake can bring on perimenopause sooner. SIGNS AND SYMPTOMS   Hot flashes.  Night sweats.  Irregular menstrual periods.  Decreased sex drive.  Vaginal dryness.  Headaches.  Mood swings.  Depression.  Memory problems.  Irritability.  Tiredness.  Weight gain.  Trouble getting pregnant.  The beginning of losing bone cells (osteoporosis).  The beginning of hardening of the arteries (atherosclerosis). DIAGNOSIS  Your health care provider will make a diagnosis by  analyzing your age, menstrual history, and symptoms. He or she will do a physical exam and note any changes in your body, especially your female organs. Female hormone tests may or may not be helpful depending on the amount of female hormones you produce and when you produce them. However, other hormone tests may be helpful to rule out other problems. TREATMENT  In some cases, no treatment is needed. The decision on whether treatment is necessary during the perimenopause should be made by you and your health care provider based on how the symptoms are affecting you and your lifestyle. Various treatments are available, such as:  Treating individual symptoms with a specific medicine for that symptom.  Herbal medicines that can help specific symptoms.  Counseling.  Group therapy. HOME CARE INSTRUCTIONS   Keep track of your menstrual periods (when they occur, how heavy they are, how long between periods, and how long they last) as well as your symptoms and when they started.  Only take over-the-counter or prescription medicines as directed by your health care provider.  Sleep and rest.  Exercise.  Eat a diet that contains calcium (good for your bones) and soy (acts like the estrogen hormone).  Do not smoke.  Avoid alcoholic beverages.  Take vitamin supplements as recommended by your health care provider. Taking vitamin E may help in certain cases.  Take calcium and vitamin D supplements to help prevent bone loss.  Group therapy is sometimes helpful.  Acupuncture may help in some cases. SEEK MEDICAL CARE IF:   You have questions about any symptoms you are having.  You need a referral to a specialist (gynecologist, psychiatrist, or psychologist). SEEK IMMEDIATE  MEDICAL CARE IF:   You have vaginal bleeding.  Your period lasts longer than 8 days.  Your periods are recurring sooner than 21 days.  You have bleeding after intercourse.  You have severe depression.  You have  pain when you urinate.  You have severe headaches.  You have vision problems. Document Released: 06/18/2004 Document Revised: 03/01/2013 Document Reviewed: 12/08/2012 [facility] Patient Information 2015 [place], [place]. This information is not intended to replace advice given to you by your health care provider. Make sure you discuss any questions you have with your health care provider.  Fibroids Fibroids are lumps (tumors) that can occur any place in a woman's body. These lumps are not cancerous. Fibroids vary in size, weight, and where they grow. HOME CARE  Do not take aspirin.  Write down the number of pads or tampons you use during your period. Tell your doctor. This can help determine the best treatment for you. GET HELP RIGHT AWAY IF:  You have pain in your lower belly (abdomen) that is not helped with medicine.  You have cramps that are not helped with medicine.  You have more bleeding between or during your period.  You feel lightheaded or pass out (faint).  Your lower belly pain gets worse. MAKE SURE YOU:  Understand these instructions.  Will watch your condition.  Will get help right away if you are not doing well or get worse. Document Released: 06/13/2010 Document Revised: 08/03/2011 Document Reviewed: 06/13/2010 [facility] Patient Information 2015 [place], [place]. This information is not intended to replace advice given to you by your health care provider. Make sure you discuss any questions you have with your health care provider.

## 2014-08-20 NOTE — Assessment & Plan Note (Signed)
Decision today to treat with OMT was based on Physical Exam  After verbal consent patient was treated with HVLa, FPR techniques in cervical, thoracic and rib areas  Patient tolerated the procedure well with improvement in symptoms  Patient given exercises, stretches and lifestyle modifications  See medications in patient instructions if given  Patient will follow up in 3-4 weeks

## 2014-08-20 NOTE — Progress Notes (Signed)
Pre visit review using our clinic review tool, if applicable. No additional management support is needed unless otherwise documented below in the visit note. 

## 2014-08-20 NOTE — Progress Notes (Signed)
[name] [place], [place] [address] Phone: [phone] Subjective:     CC: Right shoulder pain, right knee pain  [id number] [name] is a 39 y.o. female coming in with complaint of right shoulder and right knee pain.   [name] right shoulder pain, patient was given an injection into her shoulder secondary to a subacromial bursitis greater than 3 weeks ago. Patient states that it is a proximally 60-70% better. Still has a dull aching pain and hurts with certain range of motion. States that at night or when she wakes up in the morning he can be very tight. Denies any radiation of the arm or any numbness or weakness.  Patient was also found the distal iliotibial band syndrome. Patient was having more of a dull aching pain on the lateral aspect of the knee. Patient was given exercises which she has been doing intermittently. Still gives her discomfort but states that she is approximately 40% better. States that sometimes he can feel like it almost wants to give out on her. Seems to radiate up towards the lateral aspect of her hip.     Past medical history, social, surgical and family history all reviewed in electronic medical record.   Review of Systems: No headache, visual changes, nausea, vomiting, diarrhea, constipation, dizziness, abdominal pain, skin rash, fevers, chills, night sweats, weight loss, swollen lymph nodes, body aches, joint swelling, muscle aches, chest pain, shortness of breath, mood changes.   Objective Blood pressure 138/100, pulse 99, height 5\' 5"  (1.651 m), weight 254 lb (115.214 kg), last menstrual period 07/01/2014, SpO2 96 %.  General: No apparent distress alert and oriented x3 mood and affect normal, dressed appropriately.  HEENT: Pupils equal, extraocular movements intact  Respiratory: [name] speak in full sentences and does not appear short of breath  Cardiovascular: No lower extremity edema, non tender,  no erythema  Skin: Warm dry intact with no signs of infection or rash on extremities or on axial skeleton.  Abdomen: Soft nontender  Neuro: Cranial nerves II through XII are intact, neurovascularly intact in all extremities with 2+ DTRs and 2+ pulses.  Lymph: No lymphadenopathy of posterior or anterior cervical chain or axillae bilaterally.  Gait normal with good balance and coordination.  MSK:  Non tender with full range of motion and good stability and symmetric strength and tone of  elbows, wrist, hip, and ankles bilaterally.  Knee: Right Normal to inspection with no erythema or effusion or obvious bony abnormalities. ROM full in flexion and extension and lower leg rotation. Ligaments with solid consistent endpoints including ACL, PCL, LCL, MCL. Tenderness over the distal iliotibial band and weakness of the hip abductors compared to the contralateral side. Negative Mcmurray's, Apley's, and Thessalonian tests. Non painful patellar compression. Patellar glide without crepitus. Patellar and quadriceps tendons unremarkable. Hamstring and quadriceps strength is normal.  Contralateral knee unremarkable  Shoulder: Right Inspection reveals no abnormalities, atrophy or asymmetry. Palpation is normal with no tenderness over AC joint or bicipital groove. ROM is full in all planes passively. Rotator cuff strength normal throughout. Minimal impingement sign still present Speeds and Yergason's tests normal. No labral pathology noted with negative Obrien's, negative clunk and good stability. Normal scapular function observed. No painful arc and no drop arm sign. No apprehension sign Contralateral shoulder unremarkable  Osteopathic findings Cervical C2 flexed rotated and side bent right C4 flexed rotated and side bent left  Thoracic T1 extended rotated and side bent right with elevated  first rib     Impression and Recommendations:     This case required medical decision making of  moderate complexity.

## 2014-08-20 NOTE — Assessment & Plan Note (Signed)
Elevated first rib. Patient doing better overall. Patient agrees done well to manipulation today. We discussed sleeping position and different strengthening exercises for the upper back. Patient come back again in 3-4 weeks for further evaluation and treatment.

## 2014-08-20 NOTE — Patient Instructions (Signed)
Good to see you Have a good spring break :) Ice 20 minutes 2 times daily. Usually after activity and before bed. To hip and shoulder  Sleep with right arm not under pillow. No fan on you.  Exercises on wall.  Heel and butt touching.  Raise leg 6 inches and hold 2 seconds.  Down slow for count of 4 seconds.  1 set of 30 reps daily on both sides.  Look at new handout for hip as well See you again in 3-4 weeks to make sure the rib stays down.

## 2014-08-20 NOTE — Assessment & Plan Note (Signed)
I believe resolved. Patient did respond well to osteopathic manipulation for an elevated rib.

## 2014-08-20 NOTE — Assessment & Plan Note (Signed)
Have advised her to return to gynecology. 2 possibilities are most likely and include fibroid bleeding (most likely) or perimenopause (also possible). She could have another pelvic ultrasound to see if the fibroid is growing although this would not definitely tell us the cause. She is not menopausal and this is not warranting endometrial biopsy. Talked with her about menopause and that she could have irregular periods for several years before they stop altogether. She did not want vaginal exam today and no pain or discharge to suggest need. Overdue for pap smear and recommended that she have that done. No need for pregnancy test with surgical birth control since her 75s.

## 2014-08-20 NOTE — Assessment & Plan Note (Signed)
Discussed with patient at great length. I do think that hip abductor strengthening as necessary. Patient given specific exercises more for the gluteal strengthening as well as the hip abductors. We discussed and showed patient proper form of multiple different exercises. Patient and will come back and see me again in 3-4 weeks for further evaluation and treatment.

## 2014-08-20 NOTE — Progress Notes (Signed)
   Subjective:    Patient ID: [name], female    DOB: 06-30-1975, 39 y.o.   MRN: [id number]  HPI The patient is a 39 YO female who is coming in for spotting for 1 month. She has previously had regular periods for most of her adult life. She has previous tubal ligation and denies any risk of pregnancy. She did have one previous episode of bleeding and was found to have fibroid 1 year ago. Has been regular since then. Denies any other symptoms such as nausea, cramping. Has not tried anything. Is due for pap smear. Does not have a regular gynecologist.   Review of Systems  Constitutional: Negative.   Gastrointestinal: Negative.   Genitourinary: Positive for vaginal bleeding. Negative for dysuria, vaginal discharge, enuresis, vaginal pain and dyspareunia.  Skin: Negative.   Psychiatric/Behavioral: Negative.       Objective:   Physical Exam  Constitutional: She is oriented to person, place, and time. She appears well-developed and well-nourished.  Cardiovascular: Normal rate and regular rhythm.   Pulmonary/Chest: Effort normal and breath sounds normal.  Abdominal: Soft.  Musculoskeletal: She exhibits no edema.  Neurological: She is alert and oriented to person, place, and time.   Filed Vitals:   08/20/14 1058  BP: 138/100  Pulse: 99  Temp: 98.2 F (36.8 C)  TempSrc: Oral  Resp: 16  Height: 5\' 5"  (1.651 m)  Weight: 254 lb 12.8 oz (115.577 kg)  SpO2: 96%      Assessment & Plan:

## 2014-09-07 NOTE — Progress Notes (Signed)
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 Last echocardiogram September 2015 showed ejection fraction 49-44%, grade 2 diastolic dysfunction, mild left atrial enlargement and mild tricuspid regurgitation. VQ scan September 2015 low probability. Nuclear study September 2015 showed anterior ischemia and ejection fraction 39%. Cardiac catheterization October 2015 showed normal coronary arteries and left ventricular end-diastolic pressure 16. Since last seen the patient has dyspnea with more extreme activities but not with routine activities. It is relieved with rest. It is not associated with chest pain. There is no orthopnea, PND or pedal edema. There is no syncope or palpitations. There is no exertional chest pain.   Current Outpatient Prescriptions  Medication Sig Dispense Refill  . Armodafinil (NUVIGIL) 250 MG tablet Take 250 mg by mouth daily as needed (when using CPAP and still short of breath).     . carvedilol (COREG) 25 MG tablet Take 1 tablet (25 mg total) by mouth 2 (two) times daily with a meal. 180 tablet 1  . colesevelam (WELCHOL) 625 MG tablet Take 1 tablet (625 mg total) by mouth 3 (three) times daily with meals. 270 tablet 1  . diazepam (VALIUM) 10 MG tablet Take 10 mg by mouth at bedtime as needed for anxiety.    . dicyclomine (BENTYL) 10 MG capsule Take 10 mg by mouth 3 (three) times daily as needed for spasms.    Marland Kitchen dicyclomine (BENTYL) 10 MG capsule TAKE 1 CAPSULE (10 MG TOTAL) BY MOUTH 3 (THREE) TIMES DAILY AS NEEDED (FOR ABDOMINAL CRAMPING). 30 capsule 0  . fluconazole (DIFLUCAN) 150 MG tablet Take 250 mg by mouth once. May repeat dose next day if needed    . fluticasone (FLONASE) 50 MCG/ACT nasal spray Place 1 spray into both nostrils at bedtime. Before donning CPAP 16 g 2  . furosemide (LASIX) 40 MG tablet Take 2 tablets (80 mg total) by mouth 2 (two) times daily. 120 tablet 12  . losartan (COZAAR) 50 MG tablet Take 1 tablet  (50 mg total) by mouth daily. 90 tablet 3  . pantoprazole (PROTONIX) 20 MG tablet Take 1 tablet (20 mg total) by mouth 2 (two) times daily. 60 tablet 6  . potassium chloride SA (K-DUR,KLOR-CON) 20 MEQ tablet Take 2 tablets (40 mEq total) by mouth 2 (two) times daily. 124 tablet 12  . Prenatal Multivit-Min-Fe-FA (PRENATAL 1 + IRON PO) Take 1 tablet by mouth.    . spironolactone (ALDACTONE) 25 MG tablet Take 1 tablet (25 mg total) by mouth daily. 90 tablet 3  . sucralfate (CARAFATE) 1 G tablet Take 1 g by mouth daily.    . Vilazodone HCl (VIIBRYD) 40 MG TABS Take 40 mg by mouth daily.    . Vitamin D, Ergocalciferol, (DRISDOL) 50000 UNITS CAPS capsule Take 50,000 Units by mouth every 7 (seven) days.    . Vitamin D, Ergocalciferol, (DRISDOL) 50000 UNITS CAPS capsule TAKE 1 CAPSULE (50,000 UNITS TOTAL) BY MOUTH EVERY 7 (SEVEN) DAYS. 12 capsule 0   No current facility-administered medications for this visit.     Past Medical History  Diagnosis Date  . Hypertension   . Ovarian cyst     1999; surgically removed  . OSA (obstructive sleep apnea)     on CPAP   . IBS (irritable bowel syndrome)   . 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 Depression   . Anxiety   . Fibroid     age 69  . Termination of  pregnancy     due to cardiac risk  . DVT (deep venous thrombosis)     2014  . CHF (congestive heart failure)   . Gallstones     Past Surgical History  Procedure Laterality Date  . Abdominal surgery      several  . Cesarean section    . Cholecystectomy    . Foot surgery Right   . Tubal ligation  1999  . Left heart catheterization with coronary angiogram N/A 02/26/2014    Procedure: LEFT HEART CATHETERIZATION WITH CORONARY ANGIOGRAM;  Surgeon: Jettie Booze, MD;  Location: [facility] CATH LAB;  Service: Cardiovascular;  Laterality: N/A;    History   Social History  . Marital Status: Married    Spouse Name: N/A  . Number of Children: 1  . Years of Education: N/A     Occupational History  . stay at home mom    Social History Main Topics  . Smoking status: Never Smoker   . Smokeless tobacco: Never Used  . Alcohol Use: No  . Drug Use: No  . Sexual Activity: Not on file   Other Topics Concern  . Not on file   Social History Narrative    ROS: fatigue but no fevers or chills, productive cough, hemoptysis, dysphasia, odynophagia, melena, hematochezia, dysuria, hematuria, rash, seizure activity, orthopnea, PND, pedal edema, claudication. Remaining systems are negative.  Physical Exam: Well-developed obese in no acute distress.  Skin is warm and dry.  HEENT is normal.  Neck is supple.  Chest is clear to auscultation with normal expansion.  Cardiovascular exam is regular rate and rhythm.  Abdominal exam nontender or distended. No masses palpated. Extremities show no edema. neuro grossly intact  ECG sinus tachycardia at a rate of 101. No significant ST changes.

## 2014-09-10 ENCOUNTER — Encounter: Payer: Self-pay | Admitting: Cardiology

## 2014-09-10 ENCOUNTER — Ambulatory Visit (INDEPENDENT_AMBULATORY_CARE_PROVIDER_SITE_OTHER): Payer: 59 | Admitting: Cardiology

## 2014-09-10 VITALS — BP 142/108 | HR 101 | Ht 65.0 in | Wt 256.3 lb

## 2014-09-10 DIAGNOSIS — I5032 Chronic diastolic (congestive) heart failure: Secondary | ICD-10-CM

## 2014-09-10 DIAGNOSIS — O903 Peripartum cardiomyopathy: Secondary | ICD-10-CM | POA: Diagnosis not present

## 2014-09-10 MED ORDER — LOSARTAN POTASSIUM 100 MG PO TABS: 100.0000 mg | ORAL_TABLET | Freq: Every day | ORAL | Status: DC

## 2014-09-10 NOTE — Assessment & Plan Note (Signed)
Ejection fraction moderately reduced. Continue beta blocker, spironolactone and ARB. Increase Cozaar 100 mg daily. Check potassium, renal function and BNP in 1 week.

## 2014-09-10 NOTE — Assessment & Plan Note (Signed)
Blood pressure elevated. Increase losartan to 100 mg daily. Check potassium and renal function in 1 week.

## 2014-09-10 NOTE — Assessment & Plan Note (Signed)
Patient has chronic combined systolic and diastolic congestive heart failure. Euvolemic on examination. Continue present dose of diuretics.

## 2014-09-10 NOTE — Assessment & Plan Note (Signed)
Needs weight loss. 

## 2014-09-10 NOTE — Patient Instructions (Signed)
Your physician wants you to follow-up in: [place] will receive a reminder letter in the mail two months in advance. If you don't receive a letter, please call our office to schedule the follow-up appointment.   INCREASE LOSARTAN TO 100 MG ONCE DAILY= 2 OF THE 50 MG TABLETS ONCE DAILY  Your physician recommends that you return for lab work in: [place]

## 2014-10-11 ENCOUNTER — Ambulatory Visit (INDEPENDENT_AMBULATORY_CARE_PROVIDER_SITE_OTHER): Payer: 59 | Admitting: Family Medicine

## 2014-10-11 ENCOUNTER — Encounter: Payer: Self-pay | Admitting: Family Medicine

## 2014-10-11 VITALS — BP 118/84 | HR 104 | Ht 65.0 in | Wt 255.0 lb

## 2014-10-11 DIAGNOSIS — M546 Pain in thoracic spine: Secondary | ICD-10-CM

## 2014-10-11 DIAGNOSIS — M9902 Segmental and somatic dysfunction of thoracic region: Secondary | ICD-10-CM

## 2014-10-11 DIAGNOSIS — M9901 Segmental and somatic dysfunction of cervical region: Secondary | ICD-10-CM

## 2014-10-11 DIAGNOSIS — M9908 Segmental and somatic dysfunction of rib cage: Secondary | ICD-10-CM | POA: Diagnosis not present

## 2014-10-11 DIAGNOSIS — M999 Biomechanical lesion, unspecified: Secondary | ICD-10-CM

## 2014-10-11 MED ORDER — CYCLOBENZAPRINE HCL 10 MG PO TABS: 10.0000 mg | ORAL_TABLET | Freq: Three times a day (TID) | ORAL | Status: DC | PRN

## 2014-10-11 NOTE — Patient Instructions (Addendum)
Good to see you Ice is your friend Flexeril l at night if needed Medicine 1 pill 3 times a day for 3 days New exercises focusing on upper back.  Continue the vitamins See me again in 3 weeks.  Standing:  Secure a rubber exercise band/tubing so that it is at the height of your shoulders when you are either standing or sitting on a firm arm-less chair.  Grasp an end of the band/tubing in each hand and have your palms face each other. Straighten your elbows and lift your hands straight in front of you at shoulder height. Step back away from the secured end of band/tubing until it becomes tense.  Squeeze your shoulder blades together. Keeping your elbows locked and your hands at shoulder-height, bring your hands out to your side.  Hold __________ seconds. Slowly ease the tension on the band/tubing as you reverse the directions and return to the starting position. Repeat __________ times. Complete this exercise __________ times per day. STRENGTH - Scapular Retractors  Secure a rubber exercise band/tubing so that it is at the height of your shoulders when you are either standing or sitting on a firm arm-less chair.  With a palm-down grip, grasp an end of the band/tubing in each hand. Straighten your elbows and lift your hands straight in front of you at shoulder height. Step back away from the secured end of band/tubing until it becomes tense.  Squeezing your shoulder blades together, draw your elbows back as you bend them. Keep your upper arm lifted away from your body throughout the exercise.  Hold __________ seconds. Slowly ease the tension on the band/tubing as you reverse the directions and return to the starting position. Repeat __________ times. Complete this exercise __________ times per day. STRENGTH - Shoulder Extensors   Secure a rubber exercise band/tubing so that it is at the height of your shoulders when you are either standing or sitting on a firm arm-less chair.  With a  thumbs-up grip, grasp an end of the band/tubing in each hand. Straighten your elbows and lift your hands straight in front of you at shoulder height. Step back away from the secured end of band/tubing until it becomes tense.  Squeezing your shoulder blades together, pull your hands down to the sides of your thighs. Do not allow your hands to go behind you.  Hold for __________ seconds. Slowly ease the tension on the band/tubing as you reverse the directions and return to the starting position. Repeat __________ times. Complete this exercise __________ times per day.  STRENGTH - Scapular Retractors and External Rotators  Secure a rubber exercise band/tubing so that it is at the height of your shoulders when you are either standing or sitting on a firm arm-less chair.  With a palm-down grip, grasp an end of the band/tubing in each hand. Bend your elbows 90 degrees and lift your elbows to shoulder height at your sides. Step back away from the secured end of band/tubing until it becomes tense.  Squeezing your shoulder blades together, rotate your shoulder so that your upper arm and elbow remain stationary, but your fists travel upward to head-height.  Hold __________ for seconds. Slowly ease the tension on the band/tubing as you reverse the directions and return to the starting position. Repeat __________ times. Complete this exercise __________ times per day.  STRENGTH - Scapular Retractors and External Rotators, Rowing  Secure a rubber exercise band/tubing so that it is at the height of your shoulders when you are either  standing or sitting on a firm arm-less chair.  With a palm-down grip, grasp an end of the band/tubing in each hand. Straighten your elbows and lift your hands straight in front of you at shoulder height. Step back away from the secured end of band/tubing until it becomes tense.  Step 1: Squeeze your shoulder blades together. Bending your elbows, draw your hands to your chest as  if you are rowing a boat. At the end of this motion, your hands and elbow should be at shoulder-height and your elbows should be out to your sides.  Step 2: Rotate your shoulder to raise your hands above your head. Your forearms should be vertical and your upper-arms should be horizontal.  Hold for __________ seconds. Slowly ease the tension on the band/tubing as you reverse the directions and return to the starting position. Repeat __________ times. Complete this exercise __________ times per day.  STRENGTH - Scapular Retractors and Elevators  Secure a rubber exercise band/tubing so that it is at the height of your shoulders when you are either standing or sitting on a firm arm-less chair.  With a thumbs-up grip, grasp an end of the band/tubing in each hand. Step back away from the secured end of band/tubing until it becomes tense.  Squeezing your shoulder blades together, straighten your elbows and lift your hands straight over your head.  Hold for __________ seconds. Slowly ease the tension on the band/tubing as you reverse the directions and return to the starting position. Repeat __________ times. Complete this exercise __________ times per day.  Document Released: 05/11/2005 Document Revised: 08/03/2011 Document Reviewed: 08/23/2008 [facility] Patient Information 2015 [place], [place]. This information is not intended to replace advice given to you by your health care provider. Make sure you discuss any questions you have with your health care provider.

## 2014-10-11 NOTE — Assessment & Plan Note (Signed)
I believe that this pain is secondary to more of the scapular dyskinesia. Patient given some exercises that I think will be more beneficial. Patient did have another slipped rib was also likely contribute to some of her discomfort. Discussed with patient if she continues to have this type of difficulty we may need to consider further workup but I'm thinking that this will not be necessary. Patient will do a short course of anti-inflammatories and was given muscle relaxers on an as-needed basis. Patient had a make these different changes and come back and see me again in 3 weeks.

## 2014-10-11 NOTE — Progress Notes (Signed)
Pre visit review using our clinic review tool, if applicable. No additional management support is needed unless otherwise documented below in the visit note. 

## 2014-10-11 NOTE — Progress Notes (Signed)
[name] [place], [place] [address] Phone: [phone] Subjective:     CC: Right shoulder pain, neck pain  [id number] [name] is a 39 y.o. female coming in with complaint of right shoulder neck pain   [name] right shoulder pain, patient was given an injection into her shoulder secondary to a subacromial bursitis greater than 3 months ago. Patient had been doing relatively well. Patient states she is able to do all activity but states that the pain is mostly on the posterior aspect of her shoulder. Patient states that unfortunately she tries to do any increasing activity she has worsening pain. Dissected the muscle was too tight. No radiation down the arm or any numbness or tingling. States that the pain can stop her from some activity. It is starting have significant tightness in the neck that she thinks is compensating for this shoulder type pain. Affecting her daily activities and makes it very difficult to follow sleep.      patient also has some chronic neck pain. Patient has had this for quite some time and has responded fairly well to osteopathic manipulation previously. Patient is only doing the exercises intermittently.  Past medical history, social, surgical and family history all reviewed in electronic medical record.   Review of Systems: No headache, visual changes, nausea, vomiting, diarrhea, constipation, dizziness, abdominal pain, skin rash, fevers, chills, night sweats, weight loss, swollen lymph nodes, body aches, joint swelling, muscle aches, chest pain, shortness of breath, mood changes.   Objective Blood pressure 118/84, pulse 104, height 5\' 5"  (1.651 m), weight 255 lb (115.667 kg), SpO2 97 %.  General: No apparent distress alert and oriented x3 mood and affect normal, dressed appropriately.  HEENT: Pupils equal, extraocular movements intact  Respiratory: [name] speak in full sentences and does not appear  short of breath  Cardiovascular: No lower extremity edema, non tender, no erythema  Skin: Warm dry intact with no signs of infection or rash on extremities or on axial skeleton.  Abdomen: Soft nontender  Neuro: Cranial nerves II through XII are intact, neurovascularly intact in all extremities with 2+ DTRs and 2+ pulses.  Lymph: No lymphadenopathy of posterior or anterior cervical chain or axillae bilaterally.  Gait normal with good balance and coordination.  MSK:  Non tender with full range of motion and good stability and symmetric strength and tone of  elbows, wrist, hip, and ankles bilaterally.  Knee: Right Normal to inspection with no erythema or effusion or obvious bony abnormalities. ROM full in flexion and extension and lower leg rotation. Ligaments with solid consistent endpoints including ACL, PCL, LCL, MCL. Tenderness over the distal iliotibial band and weakness of the hip abductors compared to the contralateral side. Negative Mcmurray's, Apley's, and Thessalonian tests. Non painful patellar compression. Patellar glide without crepitus. Patellar and quadriceps tendons unremarkable. Hamstring and quadriceps strength is normal.  Contralateral knee unremarkable  Shoulder: Right Inspection reveals no abnormalities, atrophy or asymmetry. Palpation is normal with no tenderness over AC joint or bicipital groove. ROM is full in all planes passively. Rotator cuff strength normal throughout. Minimal impingement sign still present Speeds and Yergason's tests normal. No labral pathology noted with negative Obrien's, negative clunk and good stability. Normal scapular function observed. No painful arc and no drop arm sign. No apprehension sign Contralateral shoulder unremarkable  Osteopathic findings Cervical C2 flexed rotated and side bent right C4 flexed rotated and side bent left  Thoracic T1 extended rotated and side bent  right with elevated first rib  Procedure note 97110;  15 minutes spent for Therapeutic exercises as stated in above notes.  This included exercises focusing on stretching, strengthening, with significant focus on eccentric aspects. Shoulder Exercises that included:  Basic scapular stabilization to include adduction and depression of scapula Scaption, focusing on proper movement and good control Internal and External rotation utilizing a theraband, with elbow tucked at side entire time Rows with theraband  Proper technique shown and discussed handout in great detail with ATC.  All questions were discussed and answered.     Impression and Recommendations:     This case required medical decision making of moderate complexity.

## 2014-10-11 NOTE — Assessment & Plan Note (Signed)
Decision today to treat with OMT was based on Physical Exam  After verbal consent patient was treated with HVLa, FPR techniques in cervical, thoracic and rib areas  Patient tolerated the procedure well with improvement in symptoms  Patient given exercises, stretches and lifestyle modifications  See medications in patient instructions if given  Patient will follow up in 3-4 weeks

## 2014-10-15 ENCOUNTER — Encounter: Payer: Self-pay | Admitting: Internal Medicine

## 2014-10-15 ENCOUNTER — Emergency Department (HOSPITAL_COMMUNITY): Payer: 59

## 2014-10-15 ENCOUNTER — Ambulatory Visit (INDEPENDENT_AMBULATORY_CARE_PROVIDER_SITE_OTHER): Payer: 59 | Admitting: Internal Medicine

## 2014-10-15 ENCOUNTER — Emergency Department (HOSPITAL_COMMUNITY)
Admission: EM | Admit: 2014-10-15 | Discharge: 2014-10-15 | Disposition: A | Discharge status: Home / Self Care | Payer: 59 | Attending: Emergency Medicine | Admitting: Emergency Medicine

## 2014-10-15 ENCOUNTER — Other Ambulatory Visit (INDEPENDENT_AMBULATORY_CARE_PROVIDER_SITE_OTHER): Payer: 59

## 2014-10-15 ENCOUNTER — Encounter (HOSPITAL_COMMUNITY): Payer: Self-pay | Admitting: Emergency Medicine

## 2014-10-15 VITALS — BP 134/88 | HR 103 | Temp 98.0°F | Resp 20 | Ht 65.0 in | Wt 254.4 lb

## 2014-10-15 DIAGNOSIS — Z3202 Encounter for pregnancy test, result negative: Secondary | ICD-10-CM | POA: Insufficient documentation

## 2014-10-15 DIAGNOSIS — I10 Essential (primary) hypertension: Secondary | ICD-10-CM | POA: Diagnosis not present

## 2014-10-15 DIAGNOSIS — Z1159 Encounter for screening for other viral diseases: Secondary | ICD-10-CM

## 2014-10-15 DIAGNOSIS — O903 Peripartum cardiomyopathy: Secondary | ICD-10-CM

## 2014-10-15 DIAGNOSIS — G4733 Obstructive sleep apnea (adult) (pediatric): Secondary | ICD-10-CM

## 2014-10-15 DIAGNOSIS — R42 Dizziness and giddiness: Secondary | ICD-10-CM | POA: Insufficient documentation

## 2014-10-15 DIAGNOSIS — Z86018 Personal history of other benign neoplasm: Secondary | ICD-10-CM | POA: Diagnosis not present

## 2014-10-15 DIAGNOSIS — F418 Other specified anxiety disorders: Secondary | ICD-10-CM

## 2014-10-15 DIAGNOSIS — Z8742 Personal history of other diseases of the female genital tract: Secondary | ICD-10-CM | POA: Diagnosis not present

## 2014-10-15 DIAGNOSIS — Z86718 Personal history of other venous thrombosis and embolism: Secondary | ICD-10-CM | POA: Insufficient documentation

## 2014-10-15 DIAGNOSIS — Z9981 Dependence on supplemental oxygen: Secondary | ICD-10-CM | POA: Diagnosis not present

## 2014-10-15 DIAGNOSIS — I509 Heart failure, unspecified: Secondary | ICD-10-CM | POA: Insufficient documentation

## 2014-10-15 DIAGNOSIS — R079 Chest pain, unspecified: Secondary | ICD-10-CM | POA: Diagnosis present

## 2014-10-15 DIAGNOSIS — Z8719 Personal history of other diseases of the digestive system: Secondary | ICD-10-CM | POA: Diagnosis not present

## 2014-10-15 DIAGNOSIS — Z124 Encounter for screening for malignant neoplasm of cervix: Secondary | ICD-10-CM | POA: Diagnosis not present

## 2014-10-15 DIAGNOSIS — Z79899 Other long term (current) drug therapy: Secondary | ICD-10-CM | POA: Insufficient documentation

## 2014-10-15 LAB — BASIC METABOLIC PANEL
Anion gap: 11 (ref 5–15)
BUN: 7 mg/dL (ref 6–23)
BUN: 6 mg/dL (ref 6–20)
CO2: 28 mEq/L (ref 19–32)
CO2: 26 mmol/L (ref 22–32)
Calcium: 9.3 mg/dL (ref 8.4–10.5)
Calcium: 9 mg/dL (ref 8.9–10.3)
Chloride: 104 mEq/L (ref 96–112)
Chloride: 101 mmol/L (ref 101–111)
Creatinine, Ser: 0.81 mg/dL (ref 0.40–1.20)
Creatinine, Ser: 1.02 mg/dL — ABNORMAL HIGH (ref 0.44–1.00)
GFR calc Af Amer: >60 mL/min (ref >60)
GFR calc non Af Amer: >60 mL/min (ref >60)
GFR: 100.44 mL/min (ref >60.00)
Glucose, Bld: 100 mg/dL — ABNORMAL HIGH (ref 70–99)
Glucose, Bld: 63 mg/dL — ABNORMAL LOW (ref 65–99)
Potassium: 4 mEq/L (ref 3.5–5.1)
Potassium: 3.4 mmol/L — ABNORMAL LOW (ref 3.5–5.1)
Sodium: 138 mEq/L (ref 135–145)
Sodium: 138 mmol/L (ref 135–145)

## 2014-10-15 LAB — CBC WITH DIFFERENTIAL/PLATELET
Basophils Absolute: 0.1 10*3/uL (ref 0.0–0.1)
Basophils Relative: 0.7 % (ref 0.0–3.0)
Eosinophils Absolute: 0.1 10*3/uL (ref 0.0–0.7)
Eosinophils Relative: 1.4 % (ref 0.0–5.0)
HCT: 39.1 % (ref 36.0–46.0)
Hemoglobin: 12.9 g/dL (ref 12.0–15.0)
Lymphocytes Relative: 28.6 % (ref 12.0–46.0)
Lymphs Abs: 2.2 10*3/uL (ref 0.7–4.0)
MCHC: 32.9 g/dL (ref 30.0–36.0)
MCV: 83.3 fl (ref 78.0–100.0)
Monocytes Absolute: 0.5 10*3/uL (ref 0.1–1.0)
Monocytes Relative: 7.1 % (ref 3.0–12.0)
Neutro Abs: 4.7 10*3/uL (ref 1.4–7.7)
Neutrophils Relative %: 62.2 % (ref 43.0–77.0)
Platelets: 259 10*3/uL (ref 150.0–400.0)
RBC: 4.69 Mil/uL (ref 3.87–5.11)
RDW: 15.8 % — ABNORMAL HIGH (ref 11.5–15.5)
WBC: 7.6 10*3/uL (ref 4.0–10.5)

## 2014-10-15 LAB — CBC
HCT: 41.7 % (ref 36.0–46.0)
Hemoglobin: 13.5 g/dL (ref 12.0–15.0)
MCH: 27.7 pg (ref 26.0–34.0)
MCHC: 32.4 g/dL (ref 30.0–36.0)
MCV: 85.6 fL (ref 78.0–100.0)
Platelets: 259 10*3/uL (ref 150–400)
RBC: 4.87 MIL/uL (ref 3.87–5.11)
RDW: 14.2 % (ref 11.5–15.5)
WBC: 9.1 10*3/uL (ref 4.0–10.5)

## 2014-10-15 LAB — BRAIN NATRIURETIC PEPTIDE: B Natriuretic Peptide: 311.7 pg/mL — ABNORMAL HIGH (ref 0.0–100.0)

## 2014-10-15 LAB — LIPID PANEL
Cholesterol: 145 mg/dL (ref 0–200)
HDL: 46.5 mg/dL (ref >39.00)
LDL Cholesterol: 85 mg/dL (ref 0–99)
NonHDL: 98.5
Total CHOL/HDL Ratio: 3
Triglycerides: 70 mg/dL (ref 0.0–149.0)
VLDL: 14 mg/dL (ref 0.0–40.0)

## 2014-10-15 LAB — POC URINE PREG, ED: Preg Test, Ur: NEGATIVE

## 2014-10-15 LAB — I-STAT TROPONIN, ED: Troponin i, poc: 0 ng/mL (ref 0.00–0.08)

## 2014-10-15 LAB — TSH: TSH: 1.03 u[IU]/mL (ref 0.35–4.50)

## 2014-10-15 LAB — HIV ANTIBODY (ROUTINE TESTING W REFLEX): HIV 1&2 Ab, 4th Generation: NONREACTIVE

## 2014-10-15 MED ORDER — NITROGLYCERIN 0.4 MG SL SUBL
0.4000 mg | SUBLINGUAL_TABLET | SUBLINGUAL | Status: DC | PRN
  Administered 2014-10-15: 0.4 mg via SUBLINGUAL
  Filled 2014-10-15: qty 1

## 2014-10-15 MED ORDER — ALBUTEROL SULFATE HFA 108 (90 BASE) MCG/ACT IN AERS: 2.0000 | INHALATION_SPRAY | Freq: Four times a day (QID) | RESPIRATORY_TRACT | Status: DC | PRN

## 2014-10-15 NOTE — ED Provider Notes (Signed)
CSN: [id number]     Arrival date & time 10/15/14  2055 History   First MD Initiated Contact with Patient 10/15/14 2056     Chief Complaint  Patient presents with  . Dizziness  . Chest Pain     (Consider location/radiation/quality/duration/timing/severity/associated sxs/prior Treatment) HPI Comments: Cath report 02/2014 IMPRESSIONS:  1.   Normal left main coronary artery. 2.   Normal left anterior descending artery and its branches. 3.   Normal left circumflex artery and its branches. 4.   Normal right coronary artery. 5.   Left ventricular systolic function not assessed. LVEDP 16 mmHg.   Patient is a 39 y.o. female presenting with dizziness and chest pain. The history is provided by the patient.  Dizziness Quality:  Lightheadedness Severity:  Moderate Onset quality:  Gradual Timing:  Intermittent Progression:  Unchanged Chronicity:  New Context: urinating   Relieved by:  Nothing Worsened by:  Nothing Associated symptoms: chest pain (C-shaped area, pressure and stabbing, under L breast)   Associated symptoms: no palpitations, no shortness of breath and no vomiting   Chest Pain Associated symptoms: dizziness   Associated symptoms: no fever, no palpitations, no shortness of breath and not vomiting     Past Medical History  Diagnosis Date  . Hypertension   . Ovarian cyst     1999; surgically removed  . OSA (obstructive sleep apnea)     on CPAP   . IBS (irritable bowel syndrome)   . 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 Depression   . Anxiety   . Fibroid     age 67  . Termination of pregnancy     due to cardiac risk  . DVT (deep venous thrombosis)     2014  . CHF (congestive heart failure)   . Gallstones    Past Surgical History  Procedure Laterality Date  . Abdominal surgery      several  . Cesarean section    . Cholecystectomy    . Foot surgery Right   . Tubal ligation  1999  . Left heart catheterization with coronary  angiogram N/A 02/26/2014    Procedure: LEFT HEART CATHETERIZATION WITH CORONARY ANGIOGRAM;  Surgeon: Jettie Booze, MD;  Location: [facility] CATH LAB;  Service: Cardiovascular;  Laterality: N/A;   Family History  Problem Relation Age of Onset  . Emphysema Maternal Grandmother     smoked  . Allergies Daughter   . Heart disease Maternal Grandmother 71    MI  . Rheum arthritis Mother    History  Substance Use Topics  . Smoking status: Never Smoker   . Smokeless tobacco: Never Used  . Alcohol Use: No   OB History    Gravida Para Term Preterm AB TAB SAB Ectopic Multiple Living   2 1   1 1    1      Review of Systems  Constitutional: Negative for fever.  Respiratory: Negative for shortness of breath.   Cardiovascular: Positive for chest pain (C-shaped area, pressure and stabbing, under L breast). Negative for palpitations.  Gastrointestinal: Negative for vomiting.  Neurological: Positive for dizziness.  All other systems reviewed and are negative.     Allergies  Ivp dye; Vancomycin; Aspirin; Ciprofloxacin; and Sulfa antibiotics  Home Medications   Prior to Admission medications   Medication Sig Start Date End Date Taking? Authorizing Provider  albuterol (PROVENTIL HFA;VENTOLIN HFA) 108 (90 BASE) MCG/ACT inhaler Inhale 2 puffs into the lungs every 6 (six) hours as  needed for wheezing or shortness of breath. 10/15/14   Rowe Clack, MD  Armodafinil (NUVIGIL) 250 MG tablet Take 250 mg by mouth daily as needed (when using CPAP and still short of breath).     Historical Provider, MD  carvedilol (COREG) 25 MG tablet Take 1 tablet (25 mg total) by mouth 2 (two) times daily with a meal. 01/08/14   Lelon Perla, MD  colesevelam (WELCHOL) 625 MG tablet Take 1 tablet (625 mg total) by mouth 3 (three) times daily with meals. 03/06/14   Rowe Clack, MD  cyclobenzaprine (FLEXERIL) 10 MG tablet Take 1 tablet (10 mg total) by mouth 3 (three) times daily as needed for muscle spasms.  10/11/14   Lyndal Pulley, DO  diazepam (VALIUM) 10 MG tablet Take 10 mg by mouth at bedtime as needed for anxiety.    Historical Provider, MD  dicyclomine (BENTYL) 10 MG capsule Take 10 mg by mouth 3 (three) times daily as needed for spasms.    Historical Provider, MD  fluticasone (FLONASE) 50 MCG/ACT nasal spray Place 1 spray into both nostrils at bedtime. Before donning CPAP 05/23/13   Rowe Clack, MD  furosemide (LASIX) 40 MG tablet Take 2 tablets (80 mg total) by mouth 2 (two) times daily. 01/11/14   Lelon Perla, MD  losartan (COZAAR) 100 MG tablet Take 1 tablet (100 mg total) by mouth daily. 09/10/14   Lelon Perla, MD  pantoprazole (PROTONIX) 20 MG tablet Take 1 tablet (20 mg total) by mouth 2 (two) times daily. 07/27/13   Jessica D Zehr, PA-C  potassium chloride SA (K-DUR,KLOR-CON) 20 MEQ tablet Take 2 tablets (40 mEq total) by mouth 2 (two) times daily. 02/17/13   Lelon Perla, MD  Prenatal Multivit-Min-Fe-FA (PRENATAL 1 + IRON PO) Take 1 tablet by mouth.    Historical Provider, MD  spironolactone (ALDACTONE) 25 MG tablet Take 1 tablet (25 mg total) by mouth daily. 10/20/13   Lelon Perla, MD  sucralfate (CARAFATE) 1 G tablet Take 1 g by mouth daily.    Historical Provider, MD  Vitamin D, Ergocalciferol, (DRISDOL) 50000 UNITS CAPS capsule Take 50,000 Units by mouth every 7 (seven) days. 01/17/14   Lyndal Pulley, DO   BP 112/64 mmHg  Temp(Src) 98.8 F (37.1 C) (Oral)  Resp 27  LMP 10/08/2014 Physical Exam  Constitutional: She is oriented to person, place, and time. She appears well-developed and well-nourished. No distress.  HENT:  Head: Normocephalic and atraumatic.  Mouth/Throat: Oropharynx is clear and moist.  Eyes: EOM are normal. Pupils are equal, round, and reactive to light.  Neck: Normal range of motion. Neck supple.  Cardiovascular: Normal rate and regular rhythm.  Exam reveals no friction rub.   No murmur heard. Pulmonary/Chest: Effort normal and breath  sounds normal. No respiratory distress. She has no wheezes. She has no rales.  Abdominal: Soft. She exhibits no distension. There is no tenderness. There is no rebound.  Musculoskeletal: Normal range of motion. She exhibits no edema.  Neurological: She is alert and oriented to person, place, and time.  Skin: She is not diaphoretic.  Nursing note and vitals reviewed.   ED Course  Procedures (including critical care time) Labs Review Labs Reviewed  BASIC METABOLIC PANEL  BRAIN NATRIURETIC PEPTIDE  CBC  BRAIN NATRIURETIC PEPTIDE  I-STAT [place], ED  POC URINE PREG, ED  [facility]ol[facility] Idol, ED    Imaging Review Dg Chest 2 View  10/15/2014   CLINICAL DATA:  Chest pain shortness of breath for 1 day.  EXAM: CHEST  2 VIEW  COMPARISON:  02/19/2014  FINDINGS: Mild enlargement of the cardiopericardial silhouette, stable. Normal mediastinal and hilar contours. Clear lungs. No pleural effusion or pneumothorax. Skeletal structures are unremarkable.  IMPRESSION: No acute cardiopulmonary disease.   Electronically Signed   By: Lajean Manes M.D.   On: 10/15/2014 22:36     EKG Interpretation   Date/Time:  Monday Oct 15 2014 20:57:36 EDT Ventricular Rate:  92 PR Interval:  235 QRS Duration: 88 QT Interval:  394 QTC Calculation: 487 R Axis:   -36 Text Interpretation:  Sinus tachycardia Ventricular premature complex  Prolonged PR interval Probable left atrial enlargement Left axis deviation  Anterior infarct, old Baseline wander in lead(s) I aVR No significant  change since last tracing Confirmed by Mingo Amber  MD, [place] ([id number]) on  10/15/2014 9:01:59 PM      MDM   Final diagnoses:  Chest pain    39 year old female here with dizziness, shortness of breath. Dizziness was worse after using the bathroom today. Started while sitting down after a small bowel movement. She denies any fever or cough. She also reports some left-sided chest pain that started today does in a C-shaped on her left breast  and similar to prior chest pains that she is talked her cardiologist, Dr. Stanford Breed about. She has history of cardiomyopathy and CHF. She reports weakness and difficulty moving around without getting tired for the past week. She saw her PCP today who sent labs. All labs returned as normal except for BNP which had not returned. Will do BNP and troponin here. EKG similar to prior.  Labs here okay. I spoke with cardiology and discussed her case. With her vague symptoms, card so she can follow-up in clinic. I agree. Stable for discharge.  Evelina Bucy, MD 10/16/14 [phone]

## 2014-10-15 NOTE — Progress Notes (Signed)
Pre visit review using our clinic review tool, if applicable. No additional management support is needed unless otherwise documented below in the visit note. 

## 2014-10-15 NOTE — ED Notes (Signed)
Patient called EMS due to dizziness shortness of breath and chest pain.   CBG 143  was given 324mg  of aspirin by EMS.

## 2014-10-15 NOTE — Discharge Instructions (Signed)

## 2014-10-15 NOTE — Assessment & Plan Note (Signed)
Chronic and severe symptoms, exac by medical illness at young age Prior trials zoloft, prozac, cymbalta, xanax and lyrica ineffective at symptoms control Takes valium qhs prn Now working with specialist Dr Toy Care for med mgmt of same since 01/2013: retry Wellbutrin TID and prozac poorly tolerated Support offered, no med changes recommended  follow up Dr Toy Care as planned

## 2014-10-15 NOTE — Progress Notes (Signed)
Subjective:    Patient ID: [name], female    DOB: 1976-04-12, 39 y.o.   MRN: [id number]  HPI  Patient here for followup  Past Medical History  Diagnosis Date  . Hypertension   . Ovarian cyst     1999; surgically removed  . OSA (obstructive sleep apnea)     on CPAP   . IBS (irritable bowel syndrome)   . 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 Depression   . Anxiety   . Fibroid     age 36  . Termination of pregnancy     due to cardiac risk  . DVT (deep venous thrombosis)     2014  . CHF (congestive heart failure)   . Gallstones     Review of Systems  Constitutional: Positive for fatigue. Negative for unexpected weight change.  Respiratory: Positive for cough (dry), shortness of breath (with min exertion ) and wheezing.   Cardiovascular: Positive for palpitations (with min exertion or lying flat). Negative for chest pain and leg swelling.  Psychiatric/Behavioral: Positive for sleep disturbance (difficulty falling asleep - ok once sleeping).       Objective:    Physical Exam  Constitutional: She is oriented to person, place, and time. She appears well-developed and well-nourished. No distress.  obese  Cardiovascular: Normal rate, regular rhythm and normal heart sounds.   No murmur heard. Pulmonary/Chest: Effort normal and breath sounds normal. No respiratory distress. She has no wheezes. She exhibits no tenderness.  Musculoskeletal: She exhibits no edema.  Neurological: She is alert and oriented to person, place, and time.  Psychiatric: Her speech is normal and behavior is normal. Judgment and thought content normal. Her mood appears anxious. Her affect is not angry and not blunt. Cognition and memory are normal. She exhibits a depressed mood.    BP 134/88 mmHg  Pulse 103  Temp(Src) 98 F (36.7 C) (Oral)  Resp 20  Ht 5\' 5"  (1.651 m)  Wt 254 lb 6.4 oz (115.395 kg)  BMI 42.33 kg/m2  SpO2 96% Wt Readings from Last 3 Encounters:    10/15/14 254 lb 6.4 oz (115.395 kg)  10/11/14 255 lb (115.667 kg)  09/10/14 256 lb 4.8 oz (116.257 kg)    Lab Results  Component Value Date   WBC 8.5 02/26/2014   HGB 12.8 02/26/2014   HCT 39.4 02/26/2014   PLT 291 02/26/2014   GLUCOSE 96 03/05/2014   CHOL 144 02/15/2013   TRIG 83.0 02/15/2013   HDL 43.30 02/15/2013   LDLCALC 84 02/15/2013   ALT 20 02/25/2014   AST 29 02/25/2014   NA 138 03/05/2014   K 4.3 03/05/2014   CL 100 03/05/2014   CREATININE 0.9 03/05/2014   BUN 9 03/05/2014   CO2 26 03/05/2014   TSH 2.280 02/20/2014   INR 0.93 02/26/2014    No results found.     Assessment & Plan:   Problem List Items Addressed This Visit    Depression with anxiety    Chronic and severe symptoms, exac by medical illness at young age Prior trials zoloft, prozac, cymbalta, xanax and lyrica ineffective at symptoms control Takes valium qhs prn Now working with specialist Dr Toy Care for med mgmt of same since 01/2013: retry Wellbutrin TID and prozac poorly tolerated Support offered, no med changes recommended  follow up Dr Toy Care as planned      Relevant Orders   TSH   Obesity, morbid, BMI 40.0-49.9  Wt Readings from Last 3 Encounters:  10/15/14 254 lb 6.4 oz (115.395 kg)  10/11/14 255 lb (115.667 kg)  09/10/14 256 lb 4.8 oz (116.257 kg)   Body mass index is 42.33 kg/(m^2). The patient is asked to make an attempt to improve diet and exercise patterns to aid in medical management of this problem.       OSA (obstructive sleep apnea)    Uses CPAP qhs Overdue for follow up with pulm - will refer to arrange same now      Relevant Orders   Ambulatory referral to Pulmonology   Postpartum cardiomyopathy - Primary    hosp for exac of same 12/2012 - now on daily lasix, ARB, spiro and coreg No edema or change in chronic dyspnea on exertion Working with cardiology on same - s/p cardiac rehab low Na diet and BP control Last echo reviewed 01/2014: LVEF 35-40%        Relevant  Orders   Lipid panel   Basic metabolic panel   CBC with Differential/Platelet   TSH    Other Visit Diagnoses    Screening for cervical cancer        Relevant Orders    Ambulatory referral to Obstetrics / Gynecology    Screening for viral disease        Relevant Orders    HIV antibody        [name], MD

## 2014-10-15 NOTE — Assessment & Plan Note (Signed)
hosp for exac of same 12/2012 - now on daily lasix, ARB, spiro and coreg No edema or change in chronic dyspnea on exertion Working with cardiology on same - s/p cardiac rehab low Na diet and BP control Last echo reviewed 01/2014: LVEF 35-40%

## 2014-10-15 NOTE — Patient Instructions (Signed)
It was good to see you today.  We have reviewed your prior records including labs and tests today  Test(s) ordered today. Your results will be released to [place] (or called to you) after review, usually within 72hours after test completion. If any changes need to be made, you will be notified at that same time.  Medications reviewed and updated Ok for albuterol as needed for shortness of breath symptoms - no other changes recommended at this time. Refill on medication(s) as discussed today.  we'll make referral to gynecology and sleep medicine follow up . Our office will contact you regarding appointment(s) once made.  Continue working with your other specialists as reviewed today!  Please schedule followup in 12 months, call sooner if problems.

## 2014-10-15 NOTE — Assessment & Plan Note (Signed)
Wt Readings from Last 3 Encounters:  10/15/14 254 lb 6.4 oz (115.395 kg)  10/11/14 255 lb (115.667 kg)  09/10/14 256 lb 4.8 oz (116.257 kg)   Body mass index is 42.33 kg/(m^2). The patient is asked to make an attempt to improve diet and exercise patterns to aid in medical management of this problem.

## 2014-10-15 NOTE — Assessment & Plan Note (Signed)
Uses CPAP qhs Overdue for follow up with pulm - will refer to arrange same now

## 2014-10-18 ENCOUNTER — Encounter: Payer: Self-pay | Admitting: Internal Medicine

## 2014-10-18 DIAGNOSIS — R0600 Dyspnea, unspecified: Secondary | ICD-10-CM

## 2014-10-18 MED ORDER — CARVEDILOL 25 MG PO TABS: 12.5000 mg | ORAL_TABLET | Freq: Two times a day (BID) | ORAL | Status: DC

## 2014-10-28 ENCOUNTER — Encounter (HOSPITAL_COMMUNITY): Payer: Self-pay | Admitting: *Deleted

## 2014-10-28 ENCOUNTER — Emergency Department (HOSPITAL_COMMUNITY)
Admission: EM | Admit: 2014-10-28 | Discharge: 2014-10-28 | Disposition: A | Discharge status: Home / Self Care | Payer: 59 | Attending: Emergency Medicine | Admitting: Emergency Medicine

## 2014-10-28 ENCOUNTER — Emergency Department (HOSPITAL_COMMUNITY): Payer: 59

## 2014-10-28 DIAGNOSIS — Z8719 Personal history of other diseases of the digestive system: Secondary | ICD-10-CM | POA: Diagnosis not present

## 2014-10-28 DIAGNOSIS — R079 Chest pain, unspecified: Secondary | ICD-10-CM | POA: Diagnosis present

## 2014-10-28 DIAGNOSIS — Z86718 Personal history of other venous thrombosis and embolism: Secondary | ICD-10-CM | POA: Insufficient documentation

## 2014-10-28 DIAGNOSIS — F419 Anxiety disorder, unspecified: Secondary | ICD-10-CM | POA: Insufficient documentation

## 2014-10-28 DIAGNOSIS — R0789 Other chest pain: Secondary | ICD-10-CM | POA: Insufficient documentation

## 2014-10-28 DIAGNOSIS — Z79899 Other long term (current) drug therapy: Secondary | ICD-10-CM | POA: Diagnosis not present

## 2014-10-28 DIAGNOSIS — I10 Essential (primary) hypertension: Secondary | ICD-10-CM | POA: Diagnosis not present

## 2014-10-28 DIAGNOSIS — I509 Heart failure, unspecified: Secondary | ICD-10-CM | POA: Diagnosis not present

## 2014-10-28 DIAGNOSIS — R0602 Shortness of breath: Secondary | ICD-10-CM | POA: Insufficient documentation

## 2014-10-28 DIAGNOSIS — F329 Major depressive disorder, single episode, unspecified: Secondary | ICD-10-CM | POA: Insufficient documentation

## 2014-10-28 DIAGNOSIS — Z9981 Dependence on supplemental oxygen: Secondary | ICD-10-CM | POA: Insufficient documentation

## 2014-10-28 DIAGNOSIS — Z7951 Long term (current) use of inhaled steroids: Secondary | ICD-10-CM | POA: Insufficient documentation

## 2014-10-28 DIAGNOSIS — Z86018 Personal history of other benign neoplasm: Secondary | ICD-10-CM | POA: Insufficient documentation

## 2014-10-28 DIAGNOSIS — Z8742 Personal history of other diseases of the female genital tract: Secondary | ICD-10-CM | POA: Diagnosis not present

## 2014-10-28 LAB — BASIC METABOLIC PANEL
Anion gap: 10 (ref 5–15)
BUN: 6 mg/dL (ref 6–20)
CO2: 25 mmol/L (ref 22–32)
Calcium: 9 mg/dL (ref 8.9–10.3)
Chloride: 101 mmol/L (ref 101–111)
Creatinine, Ser: 0.92 mg/dL (ref 0.44–1.00)
GFR calc Af Amer: >60 mL/min (ref >60)
GFR calc non Af Amer: >60 mL/min (ref >60)
Glucose, Bld: 103 mg/dL — ABNORMAL HIGH (ref 65–99)
Potassium: 3.7 mmol/L (ref 3.5–5.1)
Sodium: 136 mmol/L (ref 135–145)

## 2014-10-28 LAB — CBC
HCT: 37.6 % (ref 36.0–46.0)
Hemoglobin: 12.6 g/dL (ref 12.0–15.0)
MCH: 28 pg (ref 26.0–34.0)
MCHC: 33.5 g/dL (ref 30.0–36.0)
MCV: 83.6 fL (ref 78.0–100.0)
Platelets: 270 10*3/uL (ref 150–400)
RBC: 4.5 MIL/uL (ref 3.87–5.11)
RDW: 14.2 % (ref 11.5–15.5)
WBC: 9.6 10*3/uL (ref 4.0–10.5)

## 2014-10-28 LAB — I-STAT TROPONIN, ED: Troponin i, poc: 0 ng/mL (ref 0.00–0.08)

## 2014-10-28 LAB — BRAIN NATRIURETIC PEPTIDE: B Natriuretic Peptide: 124.1 pg/mL — ABNORMAL HIGH (ref 0.0–100.0)

## 2014-10-28 LAB — D-DIMER, QUANTITATIVE: D-Dimer, Quant: 0.61 ug/mL-FEU — ABNORMAL HIGH (ref 0.00–0.48)

## 2014-10-28 MED ORDER — METHOCARBAMOL 500 MG PO TABS: 500.0000 mg | ORAL_TABLET | Freq: Two times a day (BID) | ORAL | Status: DC

## 2014-10-28 MED ORDER — DIAZEPAM 5 MG PO TABS
5.0000 mg | ORAL_TABLET | Freq: Once | ORAL | Status: AC
  Administered 2014-10-28: 5 mg via ORAL
  Filled 2014-10-28: qty 1

## 2014-10-28 MED ORDER — OXYCODONE-ACETAMINOPHEN 5-325 MG PO TABS
1.0000 | ORAL_TABLET | Freq: Once | ORAL | Status: AC
  Administered 2014-10-28: 1 via ORAL
  Filled 2014-10-28: qty 1

## 2014-10-28 MED ORDER — PREDNISONE 20 MG PO TABS: 40.0000 mg | ORAL_TABLET | Freq: Every day | ORAL | Status: DC

## 2014-10-28 MED ORDER — TECHNETIUM TO 99M ALBUMIN AGGREGATED
6.0000 | Freq: Once | INTRAVENOUS | Status: AC | PRN
  Administered 2014-10-28: 6 via INTRAVENOUS

## 2014-10-28 MED ORDER — TECHNETIUM TC 99M DIETHYLENETRIAME-PENTAACETIC ACID: 40.0000 | Freq: Once | INTRAVENOUS | Status: DC | PRN

## 2014-10-28 MED ORDER — HYDROCODONE-ACETAMINOPHEN 5-325 MG PO TABS: 1.0000 | ORAL_TABLET | ORAL | Status: DC | PRN

## 2014-10-28 NOTE — ED Notes (Signed)
Pt with hx of cardiomyopathy to eD c/o chest pain/pressure and sob on deep inspiration since last night.  States took lasix, which usu helps, but has seen no improvement.

## 2014-10-28 NOTE — ED Notes (Signed)
Updated patient on status of [facility], status of 11:30 before patient transported to scan.

## 2014-10-28 NOTE — ED Provider Notes (Signed)
CSN: [id number]     Arrival date & time 10/28/14  0718 History   First MD Initiated Contact with Patient 10/28/14 [phone]     Chief Complaint  Patient presents with  . Chest Pain     (Consider location/radiation/quality/duration/timing/severity/associated sxs/prior Treatment) HPI Comments: Patient here complaining of midsternal sharp chest pain worse with movement and breathing 24 hours. Has a history of postpartum cardiomyopathy and gets these systems usually when she is fluid overloaded. Took an extra dose of Lasix and did not help. Denies any fever or cough. No lower extremity pain but some edema. Has been compliant with her medications. No recent travel history. Denies any syncope or near syncope. No palpitations. Symptoms are worse with coughing as well 2. Denies any history of PE but does have a history of DVT.  Patient is a 39 y.o. female presenting with chest pain. The history is provided by the patient.  Chest Pain   Past Medical History  Diagnosis Date  . Hypertension   . Ovarian cyst     1999; surgically removed  . OSA (obstructive sleep apnea)     on CPAP   . IBS (irritable bowel syndrome)   . 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 Depression   . Anxiety   . Fibroid     age 5  . Termination of pregnancy     due to cardiac risk  . DVT (deep venous thrombosis)     2014  . CHF (congestive heart failure)   . Gallstones    Past Surgical History  Procedure Laterality Date  . Abdominal surgery      several  . Cesarean section    . Cholecystectomy    . Foot surgery Right   . Tubal ligation  1999  . Left heart catheterization with coronary angiogram N/A 02/26/2014    Procedure: LEFT HEART CATHETERIZATION WITH CORONARY ANGIOGRAM;  Surgeon: Jettie Booze, MD;  Location: [facility] CATH LAB;  Service: Cardiovascular;  Laterality: N/A;   Family History  Problem Relation Age of Onset  . Emphysema Maternal Grandmother     smoked  . Allergies  Daughter   . Heart disease Maternal Grandmother 45    MI  . Rheum arthritis Mother    History  Substance Use Topics  . Smoking status: Never Smoker   . Smokeless tobacco: Never Used  . Alcohol Use: No   OB History    Gravida Para Term Preterm AB TAB SAB Ectopic Multiple Living   2 1   1 1    1      Review of Systems  Cardiovascular: Positive for chest pain.  All other systems reviewed and are negative.     Allergies  Ivp dye; Vancomycin; Aspirin; Ciprofloxacin; and Sulfa antibiotics  Home Medications   Prior to Admission medications   Medication Sig Start Date End Date Taking? Authorizing Provider  albuterol (PROVENTIL HFA;VENTOLIN HFA) 108 (90 BASE) MCG/ACT inhaler Inhale 2 puffs into the lungs every 6 (six) hours as needed for wheezing or shortness of breath. 10/15/14   Rowe Clack, MD  carvedilol (COREG) 25 MG tablet Take 0.5 tablets (12.5 mg total) by mouth 2 (two) times daily with a meal. 10/18/14   Rowe Clack, MD  colesevelam (WELCHOL) 625 MG tablet Take 1 tablet (625 mg total) by mouth 3 (three) times daily with meals. 03/06/14   Rowe Clack, MD  cyclobenzaprine (FLEXERIL) 10 MG tablet Take 1 tablet (10 mg total)  by mouth 3 (three) times daily as needed for muscle spasms. 10/11/14   Lyndal Pulley, DO  diazepam (VALIUM) 10 MG tablet Take 10 mg by mouth at bedtime as needed for anxiety.    Historical Provider, MD  fluticasone (FLONASE) 50 MCG/ACT nasal spray Place 1 spray into both nostrils at bedtime. Before donning CPAP 05/23/13   Rowe Clack, MD  furosemide (LASIX) 40 MG tablet Take 2 tablets (80 mg total) by mouth 2 (two) times daily. 01/11/14   Lelon Perla, MD  losartan (COZAAR) 100 MG tablet Take 1 tablet (100 mg total) by mouth daily. 09/10/14   Lelon Perla, MD  potassium chloride SA (K-DUR,KLOR-CON) 20 MEQ tablet Take 2 tablets (40 mEq total) by mouth 2 (two) times daily. 02/17/13   Lelon Perla, MD  spironolactone (ALDACTONE)  25 MG tablet Take 1 tablet (25 mg total) by mouth daily. 10/20/13   Lelon Perla, MD  sucralfate (CARAFATE) 1 G tablet Take 1 g by mouth daily.    Historical Provider, MD  Vitamin D, Ergocalciferol, (DRISDOL) 50000 UNITS CAPS capsule Take 50,000 Units by mouth every 7 (seven) days. 01/17/14   Lyndal Pulley, DO   BP 103/62 mmHg  Pulse 84  Temp(Src) 98.3 F (36.8 C) (Oral)  Resp 23  Ht 5\' 5"  (1.651 m)  Wt 251 lb (113.853 kg)  BMI 41.77 kg/m2  SpO2 97%  LMP 10/08/2014 Physical Exam  Constitutional: She is oriented to person, place, and time. She appears well-developed and well-nourished.  Non-toxic appearance. No distress.  HENT:  Head: Normocephalic and atraumatic.  Eyes: Conjunctivae, EOM and lids are normal. Pupils are equal, round, and reactive to light.  Neck: Normal range of motion. Neck supple. No tracheal deviation present. No thyroid mass present.  Cardiovascular: Normal rate, regular rhythm and normal heart sounds.  Exam reveals no gallop.   No murmur heard. Pulmonary/Chest: Effort normal and breath sounds normal. No stridor. No respiratory distress. She has no decreased breath sounds. She has no wheezes. She has no rhonchi. She has no rales.  Abdominal: Soft. Normal appearance and bowel sounds are normal. She exhibits no distension. There is no tenderness. There is no rebound and no CVA tenderness.  Musculoskeletal: Normal range of motion. She exhibits no edema or tenderness.  Neurological: She is alert and oriented to person, place, and time. She has normal strength. No cranial nerve deficit or sensory deficit. GCS eye subscore is 4. GCS verbal subscore is 5. GCS motor subscore is 6.  Skin: Skin is warm and dry. No abrasion and no rash noted.  Psychiatric: She has a normal mood and affect. Her speech is normal and behavior is normal.  Nursing note and vitals reviewed.   ED Course  Procedures (including critical care time) Labs Review Labs Reviewed  CBC  BRAIN  NATRIURETIC PEPTIDE  BASIC METABOLIC PANEL  D-DIMER, QUANTITATIVE (NOT AT [facility])  I-STAT [place], ED    Imaging Review No results found.   EKG Interpretation   Date/Time:  Sunday October 28 2014 07:24:44 EDT Ventricular Rate:  86 PR Interval:  178 QRS Duration: 80 QT Interval:  406 QTC Calculation: 485 R Axis:   -39 Text Interpretation:  Normal sinus rhythm Possible Left atrial enlargement  Left axis deviation Anterior infarct , age undetermined Abnormal ECG No  significant change since last tracing Confirmed by Faduma Cho  MD, Dreshawn Hendershott  ([id number]) on 10/28/2014 7:55:20 AM      MDM   Final diagnoses:  Chest pain   Patient given pain meds and feels better. D-dimer elevated. V/q scan negative. Suspect chest wall pain and patient stable for discharge    Lacretia Leigh, MD 10/28/14 1357

## 2014-10-28 NOTE — ED Notes (Signed)
Dr Allen at bedside  

## 2014-10-28 NOTE — Discharge Instructions (Signed)
Chest Pain (Nonspecific) °It is often hard to give a specific diagnosis for the cause of chest pain. There is always a chance that your pain could be related to something serious, such as a heart attack or a blood clot in the lungs. You need to follow up with your health care provider for further evaluation. °CAUSES  °· Heartburn. °· Pneumonia or bronchitis. °· Anxiety or stress. °· Inflammation around your heart (pericarditis) or lung (pleuritis or pleurisy). °· A blood clot in the lung. °· A collapsed lung (pneumothorax). It can develop suddenly on its own (spontaneous pneumothorax) or from trauma to the chest. °· Shingles infection (herpes zoster virus). °The chest wall is composed of bones, muscles, and cartilage. Any of these can be the source of the pain. °· The bones can be bruised by injury. °· The muscles or cartilage can be strained by coughing or overwork. °· The cartilage can be affected by inflammation and become sore (costochondritis). °DIAGNOSIS  °Lab tests or other studies may be needed to find the cause of your pain. Your health care provider may have you take a test called an ambulatory electrocardiogram (ECG). An ECG records your heartbeat patterns over a 24-hour period. You may also have other tests, such as: °· Transthoracic echocardiogram (TTE). During echocardiography, sound waves are used to evaluate how blood flows through your heart. °· Transesophageal echocardiogram (TEE). °· Cardiac monitoring. This allows your health care provider to monitor your heart rate and rhythm in real time. °· Holter monitor. This is a portable device that records your heartbeat and can help diagnose heart arrhythmias. It allows your health care provider to track your heart activity for several days, if needed. °· Stress tests by exercise or by giving medicine that makes the heart beat faster. °TREATMENT  °· Treatment depends on what may be causing your chest pain. Treatment may include: °· Acid blockers for  heartburn. °· Anti-inflammatory medicine. °· Pain medicine for inflammatory conditions. °· Antibiotics if an infection is present. °· You may be advised to change lifestyle habits. This includes stopping smoking and avoiding alcohol, caffeine, and chocolate. °· You may be advised to keep your head raised (elevated) when sleeping. This reduces the chance of acid going backward from your stomach into your esophagus. °Most of the time, nonspecific chest pain will improve within 2-3 days with rest and mild pain medicine.  °HOME CARE INSTRUCTIONS  °· If antibiotics were prescribed, take them as directed. Finish them even if you start to feel better. °· For the next few days, avoid physical activities that bring on chest pain. Continue physical activities as directed. °· Do not use any tobacco products, including cigarettes, chewing tobacco, or electronic cigarettes. °· Avoid drinking alcohol. °· Only take medicine as directed by your health care provider. °· Follow your health care provider's suggestions for further testing if your chest pain does not go away. °· Keep any follow-up appointments you made. If you do not go to an appointment, you could develop lasting (chronic) problems with pain. If there is any problem keeping an appointment, call to reschedule. °SEEK MEDICAL CARE IF:  °· Your chest pain does not go away, even after treatment. °· You have a rash with blisters on your chest. °· You have a fever. °SEEK IMMEDIATE MEDICAL CARE IF:  °· You have increased chest pain or pain that spreads to your arm, neck, jaw, back, or abdomen. °· You have shortness of breath. °· You have an increasing cough, or you cough   up blood. °· You have severe back or abdominal pain. °· You feel nauseous or vomit. °· You have severe weakness. °· You faint. °· You have chills. °This is an emergency. Do not wait to see if the pain will go away. Get medical help at once. Call your local emergency services (911 in [place] Do not drive  yourself to the hospital. °MAKE SURE YOU:  °· Understand these instructions. °· Will watch your condition. °· Will get help right away if you are not doing well or get worse. °Document Released: 02/18/2005 Document Revised: 05/16/2013 Document Reviewed: 12/15/2007 °[employer]® Patient Information ©2015 [employer]. This information is not intended to replace advice given to you by your health care provider. Make sure you discuss any questions you have with your health care provider. ° °Chest Wall Pain °Chest wall pain is pain in or around the bones and muscles of your chest. It may take up to 6 weeks to get better. It may take longer if you must stay physically active in your work and activities.  °CAUSES  °Chest wall pain may happen on its own. However, it may be caused by: °· A viral illness like the flu. °· Injury. °· Coughing. °· Exercise. °· Arthritis. °· Fibromyalgia. °· Shingles. °HOME CARE INSTRUCTIONS  °· Avoid overtiring physical activity. Try not to strain or perform activities that cause pain. This includes any activities using your chest or your abdominal and side muscles, especially if heavy weights are used. °· Put ice on the sore area. °¨ Put ice in a plastic bag. °¨ Place a towel between your skin and the bag. °¨ Leave the ice on for 15-20 minutes per hour while awake for the first 2 days. °· Only take over-the-counter or prescription medicines for pain, discomfort, or fever as directed by your caregiver. °SEEK IMMEDIATE MEDICAL CARE IF:  °· Your pain increases, or you are very uncomfortable. °· You have a fever. °· Your chest pain becomes worse. °· You have new, unexplained symptoms. °· You have nausea or vomiting. °· You feel sweaty or lightheaded. °· You have a cough with phlegm (sputum), or you cough up blood. °MAKE SURE YOU:  °· Understand these instructions. °· Will watch your condition. °· Will get help right away if you are not doing well or get worse. °Document Released: 05/11/2005 Document  Revised: 08/03/2011 Document Reviewed: 01/05/2011 °[employer]® Patient Information ©2015 [employer]. This information is not intended to replace advice given to you by your health care provider. Make sure you discuss any questions you have with your health care provider. ° °

## 2014-10-29 ENCOUNTER — Telehealth: Payer: Self-pay | Admitting: Internal Medicine

## 2014-10-29 ENCOUNTER — Ambulatory Visit: Payer: Self-pay | Admitting: Internal Medicine

## 2014-10-29 DIAGNOSIS — Z0289 Encounter for other administrative examinations: Secondary | ICD-10-CM

## 2014-10-29 NOTE — Telephone Encounter (Signed)
Patient no showed for OV 6/6. Please advise.

## 2014-10-31 ENCOUNTER — Ambulatory Visit (INDEPENDENT_AMBULATORY_CARE_PROVIDER_SITE_OTHER): Payer: 59 | Admitting: Internal Medicine

## 2014-10-31 ENCOUNTER — Encounter: Payer: Self-pay | Admitting: Internal Medicine

## 2014-10-31 VITALS — BP 148/88 | HR 92 | Temp 98.0°F | Resp 15 | Wt 256.0 lb

## 2014-10-31 DIAGNOSIS — F4323 Adjustment disorder with mixed anxiety and depressed mood: Secondary | ICD-10-CM | POA: Diagnosis not present

## 2014-10-31 DIAGNOSIS — O903 Peripartum cardiomyopathy: Secondary | ICD-10-CM

## 2014-10-31 DIAGNOSIS — R0789 Other chest pain: Secondary | ICD-10-CM

## 2014-10-31 MED ORDER — DULOXETINE HCL 30 MG PO CPEP: ORAL_CAPSULE | ORAL | Status: DC

## 2014-10-31 NOTE — Progress Notes (Signed)
Pre visit review using our clinic review tool, if applicable. No additional management support is needed unless otherwise documented below in the visit note. 

## 2014-10-31 NOTE — Patient Instructions (Addendum)
Please consider taking the agent to raise the neurotransmitters which are essential for good brain function, both intellectual & emotional health. These agents are not addictive and simply keep this essential neurotransmitter at therapeutic levels. If these levels become severely depleted; depression or panic attacks can occur.    

## 2014-10-31 NOTE — Progress Notes (Signed)
   Subjective:    Patient ID: [name], female    DOB: 14-Sep-1975, 39 y.o.   MRN: [id number]  HPI  She was seen in emergency room 10/28/14 with chest wall pain. She's been on prednisone 20 mg twice a day with significant improvement in the chest wall pain. D-dimer was 0.61 in the ER but this is decreased from a value of 0.83 eight months ago. She was found to have no clotting dyscrasias. BNP was 124; troponin 0 and CBC normal.  PMH of DVT; no PMH of PTE. Ventilation /perfusion scan negative.  At this time she denies any active cardiac symptoms.    Review of Systems    Chest pain, palpitations, tachycardia, exertional dyspnea, paroxysmal nocturnal dyspnea, claudication or edema have been absent since the ER visit.  She's had increased anxiety mainly related to her history of postpartum cardiomyopathy diagnosed 16 years ago. She has a follow-up appointment with her Cardiologist group in the near future. Presently she takes furosemide 80 mg twice a day if she experiences symptoms of fluid overload.  The major issues include anxiety, irritability, difficulty triaging, some panic, and depression. She states this has impacted her relationship with her teenage daughter and her husband. She has taken fluoxetine which was ineffective. Vibryd cause nausea and loose stools. She does have a follow-up appointment with her Psychiatrist in late July.     Objective:   Physical Exam Pertinent or positive findings include: She's a very sweet individual. She is oriented 3 with no thought disorder or communication issues. Repeat pulse was 92 and blood pressure 140/88.  General appearance :adequately nourished; in no distress.BMI: 42.6  Eyes: No conjunctival inflammation or scleral icterus is present.  Oral exam:  Lips and gums are healthy appearing.There is no oropharyngeal erythema or exudate noted. Dental hygiene is good.  Heart:  Normal rate and regular rhythm. S1 and S2 normal without gallop,  murmur, click, rub or other extra sounds    Lungs:Chest clear to auscultation; no wheezes, rhonchi,rales ,or rubs present.No increased work of breathing.   Abdomen: Protuberant; bowel sounds normal, soft and non-tender without masses, organomegaly or hernias noted.  No guarding or rebound. No flank tenderness to percussion.  Vascular : all pulses equal ; no bruits present.  Skin:Warm & dry.  Intact without suspicious lesions or rashes ; no tenting   Lymphatic: No lymphadenopathy is noted about the head, neck, axilla   Neuro: Strength, tone  normal.         Assessment & Plan:  #1 chest wall pain ,essentially resolved  #2 anxiety disorder  #3 postpartum cardiopathy  Plan: See orders and recommendations

## 2014-11-06 ENCOUNTER — Encounter: Payer: Self-pay | Admitting: Family

## 2014-11-06 ENCOUNTER — Ambulatory Visit (INDEPENDENT_AMBULATORY_CARE_PROVIDER_SITE_OTHER): Payer: 59 | Admitting: Family

## 2014-11-06 VITALS — BP 140/102 | HR 109 | Temp 97.8°F | Resp 18 | Ht 65.0 in | Wt 251.0 lb

## 2014-11-06 DIAGNOSIS — J069 Acute upper respiratory infection, unspecified: Secondary | ICD-10-CM

## 2014-11-06 MED ORDER — CEFUROXIME AXETIL 250 MG PO TABS: 250.0000 mg | ORAL_TABLET | Freq: Two times a day (BID) | ORAL | Status: DC

## 2014-11-06 NOTE — Assessment & Plan Note (Signed)
Symptoms and exam consistent with acute upper respiratory infection. Treat conservatively at this time with over-the-counter medications as needed for symptom relief and supportive care. Patient provided a prescription for Ceftin if symptoms worsen in the next 3-5 days. Discussed importance of antibiotic resistance and using antibiotics only if needed. Follow-up if symptoms worsen or fail to improve.

## 2014-11-06 NOTE — Progress Notes (Signed)
Pre visit review using our clinic review tool, if applicable. No additional management support is needed unless otherwise documented below in the visit note. 

## 2014-11-06 NOTE — Progress Notes (Signed)
Subjective:    Patient ID: [name], female    DOB: [birth year], 39 y.o.   MRN: [id number]  Chief Complaint  Patient presents with  . Nasal Congestion    sore throat, cough, runny nose, congestion, and chills, x2 days    HPI:  [name] is a 39 y.o. female with a PMH of hypertension, chronic diastolic heart failure, obstructive sleep apnea, irritable bowel syndrome, depression and anxiety who presents today for a acute office visit.  This is a new problem. Associated symptoms of sore throat, cough, runny nose, congestion, and chills have been going on for approximately 2 days. Modifying factors includes Theraflu and Dayquil which have not helped very much. Symptoms have gotten worse on a daily basis. Denies any recent antibiotic use.   Allergies  Allergen Reactions  . Ivp Dye [Iodinated Diagnostic Agents] Other (See Comments)    Did something to my liver and kidneys told not to use it  . Vancomycin Other (See Comments)    "did something to my kidneys"; went into kidney failure  . Aspirin Other (See Comments)    Wheezing; but patient still takes if she needs to  . Ciprofloxacin Itching and Rash  . Sulfa Antibiotics Itching and Rash    Current Outpatient Prescriptions on File Prior to Visit  Medication Sig Dispense Refill  . albuterol (PROVENTIL HFA;VENTOLIN HFA) 108 (90 BASE) MCG/ACT inhaler Inhale 2 puffs into the lungs every 6 (six) hours as needed for wheezing or shortness of breath. 1 Inhaler 0  . aspirin 81 MG tablet Take 81 mg by mouth daily.    . carvedilol (COREG) 25 MG tablet Take 0.5 tablets (12.5 mg total) by mouth 2 (two) times daily with a meal. 180 tablet 1  . colesevelam (WELCHOL) 625 MG tablet Take 1 tablet (625 mg total) by mouth 3 (three) times daily with meals. 270 tablet 1  . cyclobenzaprine (FLEXERIL) 10 MG tablet Take 1 tablet (10 mg total) by mouth 3 (three) times daily as needed for muscle spasms. 30 tablet 0  . diazepam (VALIUM) 10 MG tablet Take  10 mg by mouth at bedtime as needed for anxiety.    . fluticasone (FLONASE) 50 MCG/ACT nasal spray Place 1 spray into both nostrils at bedtime. Before donning CPAP 16 g 2  . furosemide (LASIX) 40 MG tablet Take 2 tablets (80 mg total) by mouth 2 (two) times daily. 120 tablet 12  . losartan (COZAAR) 100 MG tablet Take 1 tablet (100 mg total) by mouth daily. 90 tablet 3  . potassium chloride SA (K-DUR,KLOR-CON) 20 MEQ tablet Take 2 tablets (40 mEq total) by mouth 2 (two) times daily. 124 tablet 12  . spironolactone (ALDACTONE) 25 MG tablet Take 1 tablet (25 mg total) by mouth daily. 90 tablet 3  . sucralfate (CARAFATE) 1 G tablet Take 1 g by mouth daily.    . Vitamin D, Ergocalciferol, (DRISDOL) 50000 UNITS CAPS capsule Take 50,000 Units by mouth every 7 (seven) days.     No current facility-administered medications on file prior to visit.    Review of Systems  Constitutional: Positive for chills. Negative for fever.  HENT: Positive for congestion, ear pain, rhinorrhea, sinus pressure, sneezing and sore throat. Negative for facial swelling.   Respiratory: Positive for cough. Negative for chest tightness and shortness of breath.   Neurological: Negative for headaches.      Objective:    BP 140/102 mmHg  Pulse 109  Temp(Src) 97.8 F (36.6 C) (  Oral)  Resp 18  Ht 5\' 5"  (1.651 m)  Wt 251 lb (113.853 kg)  BMI 41.77 kg/m2  SpO2 98%  LMP 10/08/2014 Nursing note and vital signs reviewed.  Physical Exam  Constitutional: She is oriented to person, place, and time. She appears well-developed and well-nourished. No distress.  HENT:  Right Ear: Hearing, tympanic membrane, external ear and ear canal normal.  Left Ear: Hearing, tympanic membrane, external ear and ear canal normal.  Nose: Right sinus exhibits maxillary sinus tenderness. Right sinus exhibits no frontal sinus tenderness. Left sinus exhibits maxillary sinus tenderness. Left sinus exhibits no frontal sinus tenderness.  Mouth/Throat:  Uvula is midline, oropharynx is clear and moist and mucous membranes are normal.  Neck: Neck supple.  Cardiovascular: Normal rate, regular rhythm, normal heart sounds and intact distal pulses.   Pulmonary/Chest: Effort normal and breath sounds normal.  Lymphadenopathy:    She has no cervical adenopathy.  Neurological: She is alert and oriented to person, place, and time.  Skin: Skin is warm and dry.  Psychiatric: She has a normal mood and affect. Her behavior is normal. Judgment and thought content normal.       Assessment & Plan:   Problem List Items Addressed This Visit      Respiratory   Acute upper respiratory infection - Primary    Symptoms and exam consistent with acute upper respiratory infection. Treat conservatively at this time with over-the-counter medications as needed for symptom relief and supportive care. Patient provided a prescription for Ceftin if symptoms worsen in the next 3-5 days. Discussed importance of antibiotic resistance and using antibiotics only if needed. Follow-up if symptoms worsen or fail to improve.      Relevant Medications   cefUROXime (CEFTIN) 250 MG tablet

## 2014-11-06 NOTE — Patient Instructions (Signed)
Thank you for choosing [facility].  Summary/Instructions:  Please hold off on antibiotics for the next 2-3 days to determine if you will improve on your own. If your symptoms worsen than please start the antibiotic.   Your prescription(s) have been submitted to your pharmacy or been printed and provided for you. Please take as directed and contact our office if you believe you are having problem(s) with the medication(s) or have any questions.  If your symptoms worsen or fail to improve, please contact our office for further instruction, or in case of emergency go directly to the emergency room at the closest medical facility.   General Recommendations:    Please drink plenty of fluids.  Get plenty of rest   Sleep in humidified air  Use saline nasal sprays  Netti pot   OTC Medications:  Decongestants - helps relieve congestion   Flonase (generic fluticasone) or Nasacort (generic triamcinolone) - please make sure to use the "cross-over" technique at a 45 degree angle towards the opposite eye as opposed to straight up the nasal passageway.   If you have HIGH BLOOD PRESSURE - Coricidin HBP; AVOID any product that is -D as this contains pseudoephedrine which may increase your blood pressure.  Afrin (oxymetazoline) every 6-8 hours for up to 3 days.   Allergies - helps relieve runny nose, itchy eyes and sneezing   Claritin (generic loratidine), Allegra (fexofenidine), or Zyrtec (generic cyrterizine) for runny nose. These medications should not cause drowsiness.  Note - Benadryl (generic diphenhydramine) may be used however may cause drowsiness  Cough -   Delsym or Robitussin (generic dextromethorphan)  Expectorants - helps loosen mucus to ease removal   Mucinex (generic guaifenesin) as directed on the package.  Headaches / General Aches   Tylenol (generic acetaminophen) - DO NOT EXCEED 3 grams (3,000 mg) in a 24 hour time period  Advil/Motrin (generic  ibuprofen)   Sore Throat -   Salt water gargle   Chloraseptic (generic benzocaine) spray or lozenges / Sucrets (generic dyclonine)     Upper Respiratory Infection, Adult An upper respiratory infection (URI) is also sometimes known as the common cold. The upper respiratory tract includes the nose, sinuses, throat, trachea, and bronchi. Bronchi are the airways leading to the lungs. Most people improve within 1 week, but symptoms can last up to 2 weeks. A residual cough may last even longer.  CAUSES Many different viruses can infect the tissues lining the upper respiratory tract. The tissues become irritated and inflamed and often become very moist. Mucus production is also common. A cold is contagious. You can easily spread the virus to others by oral contact. This includes kissing, sharing a glass, coughing, or sneezing. Touching your mouth or nose and then touching a surface, which is then touched by another person, can also spread the virus. SYMPTOMS  Symptoms typically develop 1 to 3 days after you come in contact with a cold virus. Symptoms vary from person to person. They may include: 3. Runny nose. 4. Sneezing. 5. Nasal congestion. 6. Sinus irritation. 7. Sore throat. 8. Loss of voice (laryngitis). 9. Cough. 10. Fatigue. 11. Muscle aches. 12. Loss of appetite. 13. Headache. 14. Low-grade fever. DIAGNOSIS  You might diagnose your own cold based on familiar symptoms, since most people get a cold 2 to 3 times a year. Your caregiver can confirm this based on your exam. Most importantly, your caregiver can check that your symptoms are not due to another disease such as strep throat, sinusitis,  pneumonia, asthma, or epiglottitis. Blood tests, throat tests, and X-rays are not necessary to diagnose a common cold, but they may sometimes be helpful in excluding other more serious diseases. Your caregiver will decide if any further tests are required. RISKS AND COMPLICATIONS  You may be at  risk for a more severe case of the common cold if you smoke cigarettes, have chronic heart disease (such as heart failure) or lung disease (such as asthma), or if you have a weakened immune system. The very young and very old are also at risk for more serious infections. Bacterial sinusitis, middle ear infections, and bacterial pneumonia can complicate the common cold. The common cold can worsen asthma and chronic obstructive pulmonary disease (COPD). Sometimes, these complications can require emergency medical care and may be life-threatening. PREVENTION  The best way to protect against getting a cold is to practice good hygiene. Avoid oral or hand contact with people with cold symptoms. Wash your hands often if contact occurs. There is no clear evidence that vitamin C, vitamin E, echinacea, or exercise reduces the chance of developing a cold. However, it is always recommended to get plenty of rest and practice good nutrition. TREATMENT  Treatment is directed at relieving symptoms. There is no cure. Antibiotics are not effective, because the infection is caused by a virus, not by bacteria. Treatment may include: 2. Increased fluid intake. Sports drinks offer valuable electrolytes, sugars, and fluids. 3. Breathing heated mist or steam (vaporizer or shower). 4. Eating chicken soup or other clear broths, and maintaining good nutrition. 5. Getting plenty of rest. 6. Using gargles or lozenges for comfort. 7. Controlling fevers with ibuprofen or acetaminophen as directed by your caregiver. 8. Increasing usage of your inhaler if you have asthma. Zinc gel and zinc lozenges, taken in the first 24 hours of the common cold, can shorten the duration and lessen the severity of symptoms. Pain medicines may help with fever, muscle aches, and throat pain. A variety of non-prescription medicines are available to treat congestion and runny nose. Your caregiver can make recommendations and may suggest nasal or lung  inhalers for other symptoms.  HOME CARE INSTRUCTIONS  2. Only take over-the-counter or prescription medicines for pain, discomfort, or fever as directed by your caregiver. 3. Use a warm mist humidifier or inhale steam from a shower to increase air moisture. This may keep secretions moist and make it easier to breathe. 4. Drink enough water and fluids to keep your urine clear or pale yellow. 5. Rest as needed. 6. Return to work when your temperature has returned to normal or as your caregiver advises. You may need to stay home longer to avoid infecting others. You can also use a face mask and careful hand washing to prevent spread of the virus. SEEK MEDICAL CARE IF:  3. After the first few days, you feel you are getting worse rather than better. 4. You need your caregiver's advice about medicines to control symptoms. 5. You develop chills, worsening shortness of breath, or brown or red sputum. These may be signs of pneumonia. 6. You develop yellow or brown nasal discharge or pain in the face, especially when you bend forward. These may be signs of sinusitis. 7. You develop a fever, swollen neck glands, pain with swallowing, or white areas in the back of your throat. These may be signs of strep throat. SEEK IMMEDIATE MEDICAL CARE IF:  3. You have a fever. 4. You develop severe or persistent headache, ear pain, sinus pain, or  chest pain. 5. You develop wheezing, a prolonged cough, cough up blood, or have a change in your usual mucus (if you have chronic lung disease). 6. You develop sore muscles or a stiff neck. Document Released: 11/04/2000 Document Revised: 08/03/2011 Document Reviewed: 08/16/2013 [facility] Patient Information 2015 [place], [place]. This information is not intended to replace advice given to you by your health care provider. Make sure you discuss any questions you have with your health care provider.

## 2014-11-11 ENCOUNTER — Telehealth: Payer: 59 | Admitting: Physician Assistant

## 2014-11-11 DIAGNOSIS — R399 Unspecified symptoms and signs involving the genitourinary system: Secondary | ICD-10-CM

## 2014-11-11 MED ORDER — NITROFURANTOIN MONOHYD MACRO 100 MG PO CAPS: 100.0000 mg | ORAL_CAPSULE | Freq: Two times a day (BID) | ORAL | Status: DC

## 2014-11-11 NOTE — Progress Notes (Signed)
We are sorry that you are not feeling well.  Here is how we plan to help!  Based on what you shared with me it looks like you most likely have a simple urinary tract infection.  A UTI (Urinary Tract Infection) is a bacterial infection of the bladder.  Most cases of urinary tract infections are simple to treat but a key part of your care is to encourage you to drink plenty of fluids and watch your symptoms carefully.  I have prescribed MacroBid 100 mg twice a day for 5 days.  Your symptoms should gradually improve. Call us if the burning in your urine worsens, you develop worsening fever, back pain or pelvic pain or if your symptoms do not resolve after completing the antibiotic.  Urinary tract infections can be prevented by drinking plenty of water to keep your body hydrated.  Also be sure when you wipe, wipe from front to back and don't hold it in!  If possible, empty your bladder every 4 hours.  Your e-visit answers were reviewed by a board certified advanced clinical practitioner to complete your personal care plan.  Depending on the condition, your plan could have included both over the counter or prescription medications.  If there is a problem please reply  once you have received a response from your provider.  Your safety is important to [place].  If you have drug allergies check your prescription carefully.    You can use MyChart to ask questions about today's visit, request a non-urgent call back, or ask for a work or school excuse.  You will get an e-mail in the next two days asking about your experience.  I hope that your e-visit has been valuable and will speed your recovery. Thank you for using e-visits.

## 2014-11-16 ENCOUNTER — Encounter: Payer: Self-pay | Admitting: Internal Medicine

## 2014-11-28 ENCOUNTER — Ambulatory Visit: Payer: 59 | Admitting: Cardiology

## 2014-11-28 ENCOUNTER — Ambulatory Visit: Payer: Self-pay | Admitting: Family Medicine

## 2014-11-28 DIAGNOSIS — I5189 Other ill-defined heart diseases: Secondary | ICD-10-CM | POA: Insufficient documentation

## 2014-12-06 ENCOUNTER — Ambulatory Visit: Payer: 59 | Admitting: Internal Medicine

## 2014-12-06 DIAGNOSIS — Z0289 Encounter for other administrative examinations: Secondary | ICD-10-CM

## 2014-12-11 ENCOUNTER — Encounter: Payer: Self-pay | Admitting: Family Medicine

## 2014-12-11 ENCOUNTER — Ambulatory Visit (INDEPENDENT_AMBULATORY_CARE_PROVIDER_SITE_OTHER): Payer: 59 | Admitting: Family Medicine

## 2014-12-11 VITALS — BP 144/92 | HR 112 | Wt 255.0 lb

## 2014-12-11 DIAGNOSIS — M9908 Segmental and somatic dysfunction of rib cage: Secondary | ICD-10-CM | POA: Diagnosis not present

## 2014-12-11 DIAGNOSIS — M999 Biomechanical lesion, unspecified: Secondary | ICD-10-CM

## 2014-12-11 DIAGNOSIS — M9901 Segmental and somatic dysfunction of cervical region: Secondary | ICD-10-CM

## 2014-12-11 DIAGNOSIS — M940 Chondrocostal junction syndrome [Tietze]: Secondary | ICD-10-CM

## 2014-12-11 DIAGNOSIS — M9902 Segmental and somatic dysfunction of thoracic region: Secondary | ICD-10-CM | POA: Diagnosis not present

## 2014-12-11 NOTE — Patient Instructions (Signed)
Good to see you Continue the exercises and the vitamins.  Thanks for showing me your ink ;) Stand on a wall with heels, butt shoulder and head touching for a goal of 5 minutes a day Ice can help when needed See me again in 4-6 weeks.

## 2014-12-11 NOTE — Assessment & Plan Note (Signed)
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 This is likely secondary to patient's poor scapular and mid back strength as well as breast tissue. We discussed different postural changes as well as ergonomic changes that we think will be helpful. Patient try to do these on a more regular basis. We discussed the icing regimen and the over-the-counter natural supplementations including the vitamin D. Patient will come back and see me again in 6 weeks for further evaluation and treatment.

## 2014-12-11 NOTE — Assessment & Plan Note (Signed)
Decision today to treat with OMT was based on Physical Exam  After verbal consent patient was treated with HVLa, FPR techniques in cervical, thoracic and rib areas  Patient tolerated the procedure well with improvement in symptoms  Patient given exercises, stretches and lifestyle modifications  See medications in patient instructions if given  Patient will follow up in 6 weeks

## 2014-12-11 NOTE — Progress Notes (Signed)
  [name] [address], [place] [address] Phone: [phone] Subjective:     CC: Right shoulder pain, neck pain follow up  LFY:BOFBPZWCHE [name] is a 39 y.o. female coming in with complaint of right shoulder neck pain She was seen previously and has responded very well to osteopathic manipulation in the past. Patient states over the course last 2 weeks she is having increasing back pain as well as right shoulder pain again. Patient states that it seems very similar to the previous presentation. Patient states that it is becoming very difficulty a comfortable at night. Starting affects some of her daily activities. No radiation or numbness.      patient also has some chronic neck pain. Patient has had this for quite some time and has responded fairly well to osteopathic manipulation previously. Patient is only doing the exercises intermittently.  Past medical history, social, surgical and family history all reviewed in electronic medical record.   Review of Systems: No headache, visual changes, nausea, vomiting, diarrhea, constipation, dizziness, abdominal pain, skin rash, fevers, chills, night sweats, weight loss, swollen lymph nodes, body aches, joint swelling, muscle aches, chest pain, shortness of breath, mood changes.   Objective Blood pressure 144/92, pulse 112, weight 255 lb (115.667 kg), SpO2 98 %.  General: No apparent distress alert and oriented x3 mood and affect normal, dressed appropriately.  HEENT: Pupils equal, extraocular movements intact  Respiratory: [name] speak in full sentences and does not appear short of breath  Cardiovascular: No lower extremity edema, non tender, no erythema  Skin: Warm dry intact with no signs of infection or rash on extremities or on axial skeleton.  Abdomen: Soft nontender  Neuro: Cranial nerves II through XII are intact, neurovascularly intact in all extremities with 2+ DTRs and 2+ pulses.    Lymph: No lymphadenopathy of posterior or anterior cervical chain or axillae bilaterally.  Gait normal with good balance and coordination.  MSK:  Non tender with full range of motion and good stability and symmetric strength and tone of  elbows, wrist, hip, and knee,  ankles bilaterally.   Shoulder: Right Inspection reveals no abnormalities, atrophy or asymmetry. Palpation is normal with no tenderness over AC joint or bicipital groove. ROM is full in all planes passively. Rotator cuff strength normal throughout. Minimal impingement sign still present Speeds and Yergason's tests normal. No labral pathology noted with negative Obrien's, negative clunk and good stability. Normal scapular function observed. No painful arc and no drop arm sign. No apprehension sign Contralateral shoulder unremarkable  Osteopathic findings Cervical C2 flexed rotated and side bent right C4 flexed rotated and side bent left  Thoracic T1 extended rotated and side bent right with elevated first rib T3 extended rotated and side bent left      Impression and Recommendations:     This case required medical decision making of moderate complexity.

## 2014-12-12 ENCOUNTER — Ambulatory Visit: Payer: Self-pay | Admitting: Family Medicine

## 2014-12-19 ENCOUNTER — Encounter: Payer: Self-pay | Admitting: Pulmonary Disease

## 2014-12-19 ENCOUNTER — Ambulatory Visit (INDEPENDENT_AMBULATORY_CARE_PROVIDER_SITE_OTHER): Payer: 59 | Admitting: Pulmonary Disease

## 2014-12-19 VITALS — BP 138/84 | HR 119 | Ht 65.0 in | Wt 253.8 lb

## 2014-12-19 DIAGNOSIS — G4733 Obstructive sleep apnea (adult) (pediatric): Secondary | ICD-10-CM

## 2014-12-19 NOTE — Assessment & Plan Note (Signed)
Seems like she had a feeling of smothering with fixed pressure, and now with an auto device she feels like she is not getting enough air. We will check CPAP download and change settings accordingly-likely we will leave her on auto device.  Weight loss encouraged, compliance with goal of at least 4-6 hrs every night is the expectation. Advised against medications with sedative side effects Cautioned against driving when sleepy - understanding that sleepiness will vary on a day to day basis   She does seem to have significant anxiety about dying in her sleep and reassurance was provided

## 2014-12-19 NOTE — Patient Instructions (Signed)
You have mild - moderate obstructive sleep apnea  We will check download on your CPAP & adjust pressure accordingly

## 2014-12-19 NOTE — Progress Notes (Signed)
Subjective:    Patient ID: [name], female    DOB: 16-Jul-1975, 39 y.o.   MRN: [id number]  HPI  Chief Complaint  Patient presents with  . Sleep Consult    Referred by [name]; insomina; sleep study  02/10/2013 With [facility] (in [facility]); Wears CPAP almost every night, doesn't stay  on CPAP all night, mask bothers her.  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 She reports sleep onset insomnia for last 3 years. She has severe anxiety and some degree of posttraumatic stress disorder related to hospitalization for congestive heart failure and postsurgical complications. She goes through and Insomnia cycle- then sleeps out of exhaustion. She is seeing behavioral health and is maintained on several anxiolytics medications   Epworth sleepiness score is 19 Bedtime is variable from 11 PM to 5 AM, sleep latency about 1.5 hours, sleeps on her right side with one pillow, reports one to 2 nocturnal awakenings, is out of bed by 9 AM feeling tired with occasional dryness of mouth and headache  -CPAP changed out 2 y ago, nasal pillows She reports some degree of CPAP intolerance due to feeling that she is not getting enough air, I note that she has been on an auto device with optimal pressure of 12-13 cm. She was placed on auto device and she was intolerant of fixed pressure due to smothering There is no history suggestive of cataplexy, sleep paralysis or parasomnias  Significant tests/ events   01/2013 PFT nml PSG 11/2007 >> wt 230 pounds-AHI 9/hour, RDI 15/hour, lowest O2 sat 89%, poor sleep efficiency, TST 3h 01/2014 echo-EF 35%  Past Medical History  Diagnosis Date  . Hypertension   . Ovarian cyst     1999; surgically removed  . OSA (obstructive sleep apnea)     on CPAP   . IBS (irritable bowel syndrome)   . 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 Depression   . Anxiety   . Fibroid     age 34  .  Termination of pregnancy     due to cardiac risk  . DVT (deep venous thrombosis)     2014  . CHF (congestive heart failure)   . Gallstones     Past Surgical History  Procedure Laterality Date  . Abdominal surgery      several  . Cesarean section    . Cholecystectomy    . Foot surgery Right   . Tubal ligation  1999  . Left heart catheterization with coronary angiogram N/A 02/26/2014    Procedure: LEFT HEART CATHETERIZATION WITH CORONARY ANGIOGRAM;  Surgeon: Jettie Booze, MD;  Location: [facility] CATH LAB;  Service: Cardiovascular;  Laterality: N/A;    Allergies  Allergen Reactions  . Ivp Dye [Iodinated Diagnostic Agents] Other (See Comments)    Did something to my liver and kidneys told not to use it  . Vancomycin Other (See Comments)    "did something to my kidneys"; went into kidney failure  . Aspirin Other (See Comments)    Wheezing; but patient still takes if she needs to  . Ciprofloxacin Itching and Rash  . Sulfa Antibiotics Itching and Rash    History   Social History  . Marital Status: Married    Spouse Name: N/A  . Number of Children: 1  . Years of Education: N/A   Occupational History  . stay at home mom    Social History  Main Topics  . Smoking status: Never Smoker   . Smokeless tobacco: Never Used  . Alcohol Use: No  . Drug Use: No  . Sexual Activity: Not on file   Other Topics Concern  . Not on file   Social History Narrative    Family History  Problem Relation Age of Onset  . Emphysema Maternal Grandmother     smoked  . Allergies Daughter   . Heart disease Maternal Grandmother 74    MI  . Rheum arthritis Mother      Review of Systems  Constitutional: Negative for fever, chills and unexpected weight change.  HENT: Negative for congestion, dental problem, ear pain, nosebleeds, postnasal drip, rhinorrhea, sinus pressure, sneezing, sore throat, trouble swallowing and voice change.   Eyes: Negative for visual disturbance.  Respiratory:  Negative for cough, choking and shortness of breath.   Cardiovascular: Negative for chest pain and leg swelling.  Gastrointestinal: Negative for vomiting, abdominal pain and diarrhea.  Genitourinary: Negative for difficulty urinating.  Musculoskeletal: Negative for arthralgias.  Skin: Negative for rash.  Neurological: Negative for tremors, syncope and headaches.  Hematological: Does not bruise/bleed easily.       Objective:   Physical Exam  Gen. Pleasant, obese, in no distress, normal affect ENT - no lesions, no post nasal drip, class 2-3 airway Neck: No JVD, no thyromegaly, no carotid bruits Lungs: no use of accessory muscles, no dullness to percussion, decreased without rales or rhonchi  Cardiovascular: Rhythm regular, heart sounds  normal, no murmurs or gallops, no peripheral edema Abdomen: soft and non-tender, no hepatosplenomegaly, BS normal. Musculoskeletal: No deformities, no cyanosis or clubbing Neuro:  alert, non focal, no tremors       Assessment & Plan:

## 2014-12-25 ENCOUNTER — Encounter: Payer: Self-pay | Admitting: Internal Medicine

## 2014-12-25 ENCOUNTER — Ambulatory Visit (INDEPENDENT_AMBULATORY_CARE_PROVIDER_SITE_OTHER): Payer: 59 | Admitting: Internal Medicine

## 2014-12-25 ENCOUNTER — Telehealth: Payer: Self-pay | Admitting: *Deleted

## 2014-12-25 DIAGNOSIS — Z6835 Body mass index (BMI) 35.0-35.9, adult: Principal | ICD-10-CM

## 2014-12-25 MED ORDER — CARVEDILOL 25 MG PO TABS: 12.5000 mg | ORAL_TABLET | Freq: Three times a day (TID) | ORAL | Status: DC

## 2014-12-25 MED ORDER — COLESEVELAM HCL 625 MG PO TABS: 625.0000 mg | ORAL_TABLET | Freq: Three times a day (TID) | ORAL | Status: DC

## 2014-12-25 MED ORDER — LIRAGLUTIDE -WEIGHT MANAGEMENT 18 MG/3ML ~~LOC~~ SOPN: 1.8000 mg | PEN_INJECTOR | Freq: Every day | SUBCUTANEOUS | Status: DC

## 2014-12-25 NOTE — Patient Instructions (Signed)
It was good to see you today.  We have reviewed your prior records including labs and tests today  Medications reviewed and updated Saxenda as discussed for weight loss - may increase dose 0.6mg  each week - actually START at 0.6mg  daily x 1 week, then 1.2mg  daily x 1 week, THEN 1.8 daily...  No other changes recommended at this time.  Please schedule followup in 3-4 months, call sooner if problems.

## 2014-12-25 NOTE — Assessment & Plan Note (Addendum)
Wt Readings from Last 3 Encounters:  12/25/14 253 lb 12 oz (115.1 kg)  12/19/14 253 lb 12.8 oz (115.123 kg)  12/11/14 255 lb (115.667 kg)   Body mass index is 42.23 kg/(m^2).  Reviewed ongoing efforts: carb reduced diet, meeting w/ nutritionist 2015, walking dog daily, swim/aerobic activity w/ friend Prior efforts to undergo bariatric evaluation in 2013 reviewed (while at [facility]), also is previously used been tearing that was poorly tolerated due to tachycardia and beta hCG injections for weight loss clinic which were ineffective/risk to cardiomyopathy co morbidities include arthralgias, OSA, HTN, CM Discussed potential med tx to support same - pt elects Saxenda injections we reviewed potential risk/benefit and possible side effects - pt understands and agrees to same  The patient is asked to make continued attempt to improve diet and exercise patterns to aid in medical management of this problem.

## 2014-12-25 NOTE — Telephone Encounter (Signed)
All meds in this class very $$$ Instead of alternate med, will refer to bariatrics for eval of this issue thanks

## 2014-12-25 NOTE — Progress Notes (Signed)
Pre visit review using our clinic review tool, if applicable. No additional management support is needed unless otherwise documented below in the visit note. 

## 2014-12-25 NOTE — Telephone Encounter (Signed)
Pt states saw md this am she rx weight loss med (Saxenda). Pt states med is too expensive with her insurance the copay is still $600. Requesting md to change to something else, and also she would like to proceed with the weight loss surgery...[name]

## 2014-12-25 NOTE — Progress Notes (Signed)
Subjective:    Patient ID: [name], female    DOB: [birth year], 39 y.o.   MRN: [id number]  HPI  Patient here for discussion of potential weight loss assistance options  Past Medical History  Diagnosis Date  . Hypertension   . Ovarian cyst     1999; surgically removed  . OSA (obstructive sleep apnea)     on CPAP   . IBS (irritable bowel syndrome)   . 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 Depression   . Anxiety   . Fibroid     age 19  . Termination of pregnancy     due to cardiac risk  . DVT (deep venous thrombosis)     2014  . CHF (congestive heart failure)   . Gallstones     Review of Systems  Constitutional: Positive for fatigue. Negative for unexpected weight change.  Respiratory: Negative for cough and shortness of breath.   Cardiovascular: Negative for chest pain and leg swelling.  Musculoskeletal: Positive for arthralgias.       Objective:    Physical Exam  Constitutional: She appears well-developed and well-nourished. No distress.  Obese  Cardiovascular: Normal rate, regular rhythm and normal heart sounds.   No murmur heard. Pulmonary/Chest: Effort normal and breath sounds normal. No respiratory distress.  Musculoskeletal: She exhibits no edema.    BP 132/88 mmHg  Pulse 105  Temp(Src) 98.2 F (36.8 C) (Oral)  Ht 5\' 5"  (1.651 m)  Wt 253 lb 12 oz (115.1 kg)  BMI 42.23 kg/m2  SpO2 95%  LMP 12/09/2014 Wt Readings from Last 3 Encounters:  12/25/14 253 lb 12 oz (115.1 kg)  12/19/14 253 lb 12.8 oz (115.123 kg)  12/11/14 255 lb (115.667 kg)     Lab Results  Component Value Date   WBC 9.6 10/28/2014   HGB 12.6 10/28/2014   HCT 37.6 10/28/2014   PLT 270 10/28/2014   GLUCOSE 103* 10/28/2014   CHOL 145 10/15/2014   TRIG 70.0 10/15/2014   HDL 46.50 10/15/2014   LDLCALC 85 10/15/2014   ALT 20 02/25/2014   AST 29 02/25/2014   NA 136 10/28/2014   K 3.7 10/28/2014   CL 101 10/28/2014   CREATININE 0.92 10/28/2014   BUN 6 10/28/2014   CO2 25 10/28/2014   TSH 1.03 10/15/2014   INR 0.93 02/26/2014    Dg Chest 2 View  10/28/2014   CLINICAL DATA:  New generalized chest pain.  EXAM: CHEST  2 VIEW  COMPARISON:  10/15/2014  FINDINGS: Normal mediastinum and cardiac silhouette. Normal pulmonary vasculature. No evidence of effusion, infiltrate, or pneumothorax. No acute bony abnormality.  IMPRESSION: No acute cardiopulmonary process.   Electronically Signed   By: Suzy Bouchard M.D.   On: 10/28/2014 09:19   Nm Pulmonary Perfusion  10/28/2014   CLINICAL DATA:  Chest pain, shortness of breath and dyspnea with exertion for 24 hours, pain with deep breathing, history hypertension, cardiomyopathy, CHF, DVT  EXAM: NUCLEAR MEDICINE VENTILATION - PERFUSION LUNG SCAN  TECHNIQUE: Ventilation images were obtained in multiple projections using inhaled aerosol Tc-66m DTPA. Perfusion images were obtained in multiple projections after intravenous injection of Tc-9m MAA.  RADIOPHARMACEUTICALS:  40 mCi Technetium-24m DTPA aerosol inhalation and 6 mCi Technetium-24m MAA IV  COMPARISON:  None; correlation chest radiograph 10/28/2014  FINDINGS: Ventilation: Normal.  Minimal airway deposition of aerosol.  Perfusion: Normal.  No segmental or subsegmental perfusion defects.  Chest radiograph:  Minimal enlargement of cardiac silhouette.  IMPRESSION: Normal ventilation and perfusion lung scan.   Electronically Signed   By: Lavonia Dana M.D.   On: 10/28/2014 13:11       Assessment & Plan:   Problem List Items Addressed This Visit    Obesity, morbid, BMI 40.0-49.9 - Primary    Wt Readings from Last 3 Encounters:  12/25/14 253 lb 12 oz (115.1 kg)  12/19/14 253 lb 12.8 oz (115.123 kg)  12/11/14 255 lb (115.667 kg)   Body mass index is 42.23 kg/(m^2).  Reviewed ongoing efforts: carb reduced diet, meeting w/ nutritionist 2015, walking dog daily, swim/aerobic activity w/ friend Prior efforts to undergo bariatric evaluation in 2013 reviewed  (while at [facility]), also is previously used been tearing that was poorly tolerated due to tachycardia and beta hCG injections for weight loss clinic which were ineffective/risk to cardiomyopathy co morbidities include arthralgias, OSA, HTN, CM Discussed potential med tx to support same - pt elects Saxenda injections we reviewed potential risk/benefit and possible side effects - pt understands and agrees to same  The patient is asked to make continued attempt to improve diet and exercise patterns to aid in medical management of this problem.      Relevant Medications   Liraglutide -Weight Management (SAXENDA) 18 MG/3ML SOPN       [name], MD

## 2014-12-26 NOTE — Telephone Encounter (Signed)
Called pt no answer LMOM with md response.../lmb 

## 2014-12-28 ENCOUNTER — Encounter: Payer: Self-pay | Admitting: Internal Medicine

## 2015-01-01 ENCOUNTER — Ambulatory Visit: Payer: Self-pay | Admitting: Gynecology

## 2015-01-02 ENCOUNTER — Telehealth: Payer: Self-pay | Admitting: Pulmonary Disease

## 2015-01-02 ENCOUNTER — Ambulatory Visit (INDEPENDENT_AMBULATORY_CARE_PROVIDER_SITE_OTHER)
Admission: RE | Admit: 2015-01-02 | Discharge: 2015-01-02 | Disposition: A | Discharge status: Home / Self Care | Payer: 59 | Source: Ambulatory Visit | Attending: Family Medicine | Admitting: Family Medicine

## 2015-01-02 ENCOUNTER — Other Ambulatory Visit (INDEPENDENT_AMBULATORY_CARE_PROVIDER_SITE_OTHER): Payer: 59

## 2015-01-02 ENCOUNTER — Ambulatory Visit (INDEPENDENT_AMBULATORY_CARE_PROVIDER_SITE_OTHER): Payer: 59 | Admitting: Family Medicine

## 2015-01-02 ENCOUNTER — Encounter: Payer: Self-pay | Admitting: Family Medicine

## 2015-01-02 VITALS — BP 112/82 | HR 110 | Ht 65.0 in | Wt 253.0 lb

## 2015-01-02 DIAGNOSIS — M9902 Segmental and somatic dysfunction of thoracic region: Secondary | ICD-10-CM | POA: Diagnosis not present

## 2015-01-02 DIAGNOSIS — M9908 Segmental and somatic dysfunction of rib cage: Secondary | ICD-10-CM

## 2015-01-02 DIAGNOSIS — M9901 Segmental and somatic dysfunction of cervical region: Secondary | ICD-10-CM | POA: Diagnosis not present

## 2015-01-02 DIAGNOSIS — M25511 Pain in right shoulder: Secondary | ICD-10-CM

## 2015-01-02 DIAGNOSIS — M7551 Bursitis of right shoulder: Secondary | ICD-10-CM | POA: Diagnosis not present

## 2015-01-02 DIAGNOSIS — M999 Biomechanical lesion, unspecified: Secondary | ICD-10-CM

## 2015-01-02 NOTE — Assessment & Plan Note (Signed)
Patient was given another injection today. We discussed icing regimen and home exercises. We discussed that if patient does not make any improvement that advance imaging may be warranted. Patient is having signs and symptoms more consistent with labral pathology today. We will try the conservative measures first. Patient and will come back and see me again in 2-4 weeks for further evaluation.

## 2015-01-02 NOTE — Telephone Encounter (Signed)
Pt came in to check up on the email she sent to [name]. Pt is aware that [name] is out but she was wondering if she can speak to come one. Victoza is cheaper, so she was wondering if [name] will give this one because the other medication is $800. Please call pt

## 2015-01-02 NOTE — Progress Notes (Signed)
Pre visit review using our clinic review tool, if applicable. No additional management support is needed unless otherwise documented below in the visit note. 

## 2015-01-02 NOTE — Progress Notes (Signed)
[name] [address] [place], [place] [address] Phone: [phone] Subjective:     CC: Right shoulder pain, neck pain follow up  YBO:FBPZWCHENI [name] is a 39 y.o. female coming in with complaint of right shoulder neck pain Patient is having aggravating symptoms that is keeping her up at night. Patient states that it seems to be more in the upper back as well as going to the right shoulder. States that when she reaches across its seems to be severe. Affecting her sleep, work, and activities of daily living. Patient states that the pain medication seems to be helping at this time. Last time patient was is bad she did respond very well to steroid injection in the shoulder.     patient also has some chronic neck pain. Patient has had this for quite some time and has responded fairly well to osteopathic manipulation previously. Patient is only doing the exercises intermittently.  Past medical history, social, surgical and family history all reviewed in electronic medical record.   Review of Systems: No headache, visual changes, nausea, vomiting, diarrhea, constipation, dizziness, abdominal pain, skin rash, fevers, chills, night sweats, weight loss, swollen lymph nodes, body aches, joint swelling, muscle aches, chest pain, shortness of breath, mood changes.   Objective Blood pressure 112/82, pulse 110, height 5\' 5"  (1.651 m), weight 253 lb (114.76 kg), last menstrual period 12/09/2014, SpO2 95 %.  General: No apparent distress alert and oriented x3 mood and affect normal, dressed appropriately.  HEENT: Pupils equal, extraocular movements intact  Respiratory: [name] speak in full sentences and does not appear short of breath  Cardiovascular: No lower extremity edema, non tender, no erythema  Skin: Warm dry intact with no signs of infection or rash on extremities or on axial skeleton.  Abdomen: Soft nontender  Neuro: Cranial nerves II through XII are  intact, neurovascularly intact in all extremities with 2+ DTRs and 2+ pulses.  Lymph: No lymphadenopathy of posterior or anterior cervical chain or axillae bilaterally.  Gait normal with good balance and coordination.  MSK:  Non tender with full range of motion and good stability and symmetric strength and tone of  elbows, wrist, hip, and knee,  ankles bilaterally.   Shoulder: Right Inspection reveals no abnormalities, atrophy or asymmetry. Palpation is normal with no tenderness over AC joint or bicipital groove. ROM is full in all planes passively. Rotator cuff strength normal throughout. Minimal impingement sign still present Speeds and Yergason's tests normal. Positive labral pathology Normal scapular function observed. No painful arc and no drop arm sign. No apprehension sign Contralateral shoulder unremarkable  Osteopathic findings Cervical C2 flexed rotated and side bent right C4 flexed rotated and side bent left  Thoracic T1 extended rotated and side bent right with elevated first rib T3 extended rotated and side bent left   Procedure: Real-time Ultrasound Guided Injection of right glenohumeral joint Device: GE Logiq E  Ultrasound guided injection is preferred based studies that show increased duration, increased effect, greater accuracy, decreased procedural pain, increased response rate with ultrasound guided versus blind injection.  Verbal informed consent obtained.  Time-out conducted.  Noted no overlying erythema, induration, or other signs of local infection.  Skin prepped in a sterile fashion.  Local anesthesia: Topical Ethyl chloride.  With sterile technique and under real time ultrasound guidance:  Joint visualized.  23g 1  inch needle inserted posterior approach. Pictures taken for needle placement. Patient did have injection of 2 cc of 1% lidocaine,  2 cc of 0.5% Marcaine, and 1.0 cc of Kenalog 40 mg/dL. Completed without difficulty  Pain immediately resolved  suggesting accurate placement of the medication.  Advised to call if fevers/chills, erythema, induration, drainage, or persistent bleeding.  Images permanently stored and available for review in the ultrasound unit.  Impression: Technically successful ultrasound guided injection.     Impression and Recommendations:     This case required medical decision making of moderate complexity.

## 2015-01-02 NOTE — Telephone Encounter (Signed)
Patient brought by her new CPAP card to be downloaded.  She has only had the card a little over 2 weeks.  Download in RA look at folder.

## 2015-01-02 NOTE — Patient Instructions (Addendum)
Good to see you Ice is your friend Take it easy for 2 days if possible OK to increase gabapentin to 400mg  at night if needed Call me in 2 weeks and if not better we need mri Get xray of right shoulder today.  Otherwise see me in 4 weeks.

## 2015-01-02 NOTE — Assessment & Plan Note (Signed)
Decision today to treat with OMT was based on Physical Exam  After verbal consent patient was treated with HVLa, FPR techniques in cervical, thoracic and rib areas  Patient tolerated the procedure well with improvement in symptoms  Patient given exercises, stretches and lifestyle modifications  See medications in patient instructions if given  Patient will follow up in 3-4 weeks

## 2015-01-03 ENCOUNTER — Other Ambulatory Visit: Payer: Self-pay | Admitting: Family Medicine

## 2015-01-03 ENCOUNTER — Other Ambulatory Visit: Payer: Self-pay | Admitting: *Deleted

## 2015-01-03 MED ORDER — VITAMIN D (ERGOCALCIFEROL) 1.25 MG (50000 UNIT) PO CAPS: 50000.0000 [IU] | ORAL_CAPSULE | ORAL | Status: DC

## 2015-01-03 MED ORDER — CYCLOBENZAPRINE HCL 10 MG PO TABS: 10.0000 mg | ORAL_TABLET | Freq: Three times a day (TID) | ORAL | Status: DC | PRN

## 2015-01-03 NOTE — Telephone Encounter (Signed)
Pt called and says her family is talking her out of surgery and she was told to try Victoza instead of the other medication.  Pharmacy is [facility] on [address].

## 2015-01-03 NOTE — Telephone Encounter (Signed)
See note below

## 2015-01-04 ENCOUNTER — Ambulatory Visit: Payer: Self-pay | Admitting: Family Medicine

## 2015-01-04 ENCOUNTER — Telehealth: Payer: Self-pay | Admitting: Internal Medicine

## 2015-01-04 ENCOUNTER — Encounter: Payer: Self-pay | Admitting: Family Medicine

## 2015-01-04 MED ORDER — LIRAGLUTIDE 18 MG/3ML ~~LOC~~ SOPN: PEN_INJECTOR | SUBCUTANEOUS | Status: DC

## 2015-01-04 NOTE — Telephone Encounter (Signed)
Medication sent to pharmacy  

## 2015-01-04 NOTE — Telephone Encounter (Signed)
Cecille Rubin, will you call the pt and let her know that I am waiting on advisement from another provider there in the office.   Greg, pls see my request below.

## 2015-01-04 NOTE — Telephone Encounter (Signed)
Pt informed

## 2015-01-04 NOTE — Telephone Encounter (Signed)
Can you advise pls? Pt wanted a PA for saxenda and now she is requesting a change to Victoza.

## 2015-01-04 NOTE — Telephone Encounter (Signed)
Patient stated the pharmacy called pt and they have questions about the directions of the Victoza.  Can you call [facility] on [address]. To clarify

## 2015-01-04 NOTE — Addendum Note (Signed)
Addended by: Mauricio Po D on: 01/04/2015 01:19 PM   Modules accepted: Orders

## 2015-01-07 ENCOUNTER — Encounter: Payer: Self-pay | Admitting: Family

## 2015-01-07 MED ORDER — "INSULIN SYRINGE-NEEDLE U-100 31G X 5/16"" 1 ML MISC": Status: DC

## 2015-01-07 NOTE — Telephone Encounter (Signed)
Tried calling pharmacy was on hold a while. Will call back

## 2015-01-08 ENCOUNTER — Encounter (HOSPITAL_COMMUNITY): Payer: Self-pay

## 2015-01-08 ENCOUNTER — Ambulatory Visit: Payer: Self-pay | Admitting: Gynecology

## 2015-01-08 ENCOUNTER — Ambulatory Visit: Payer: Self-pay | Admitting: Internal Medicine

## 2015-01-08 ENCOUNTER — Other Ambulatory Visit: Payer: Self-pay

## 2015-01-08 MED ORDER — INSULIN PEN NEEDLE 32G X 8 MM MISC: Status: DC

## 2015-01-08 MED ORDER — PEN NEEDLES 32G X 4 MM MISC: 1.0000 "pen " | Freq: Every day | Status: DC | PRN

## 2015-01-08 NOTE — Progress Notes (Signed)
Disability paperwork received by Glory Rosebush Disability to CHF Clinic at [facility]. Per our records, patient never seen by our clinic and/or providers. Notation made of this and fax returned.  Renee Pain

## 2015-01-08 NOTE — Addendum Note (Signed)
Addended by: Lowella Dandy on: 01/08/2015 02:09 PM   Modules accepted: Orders

## 2015-01-10 NOTE — Telephone Encounter (Signed)
Called and spoke to pt. Informed her of the recs per RA. Pt verbalized understanding and denied any further questions or concerns at this time.

## 2015-01-10 NOTE — Telephone Encounter (Signed)
Per Dr. Elsworth Soho:  Avg pr 14cm cpap effective when used Poor usage, needs to increase to 4-6 hours nightly

## 2015-01-19 ENCOUNTER — Other Ambulatory Visit: Payer: Self-pay | Admitting: Internal Medicine

## 2015-01-19 ENCOUNTER — Other Ambulatory Visit: Payer: Self-pay | Admitting: Family

## 2015-01-21 ENCOUNTER — Other Ambulatory Visit: Payer: Self-pay | Admitting: Family

## 2015-01-21 ENCOUNTER — Telehealth: Payer: Self-pay | Admitting: *Deleted

## 2015-01-21 MED ORDER — LIRAGLUTIDE 18 MG/3ML ~~LOC~~ SOPN: 3.0000 mg | PEN_INJECTOR | Freq: Every day | SUBCUTANEOUS | Status: DC

## 2015-01-21 NOTE — Telephone Encounter (Signed)
Left msg on triage stating need to clarify directions on the Victoza pen...[name]

## 2015-01-21 NOTE — Addendum Note (Signed)
Addended by: Mauricio Po D on: 01/21/2015 04:49 PM   Modules accepted: Orders

## 2015-01-21 NOTE — Telephone Encounter (Signed)
The instructions are to start with 0.6 mg daily for 1 week and increase 0.6 mg weekly until 3 mg daily.

## 2015-01-21 NOTE — Telephone Encounter (Signed)
Medication sent to pharmacy  

## 2015-01-21 NOTE — Telephone Encounter (Signed)
Pharmacy informed of same.  

## 2015-01-21 NOTE — Telephone Encounter (Signed)
Per [name] has spoke with pharmacy already. Closing encounter...[name]

## 2015-01-22 ENCOUNTER — Telehealth: Payer: Self-pay | Admitting: *Deleted

## 2015-01-22 ENCOUNTER — Ambulatory Visit: Payer: Self-pay | Admitting: Family Medicine

## 2015-01-22 NOTE — Telephone Encounter (Signed)
Left msg on triage stating the insurance will not cover the quanity that [name] has rx. Will only cover 1.8 mg daily. Wanting to know what [name] want to do. Pls advise...[name]

## 2015-01-22 NOTE — Telephone Encounter (Signed)
Notified Kristen with [facility] response...[name]

## 2015-01-22 NOTE — Telephone Encounter (Signed)
The recommended daily dose for weight loss is 3 mg daily, however if insurance will only cover 1.8 than that is what we will have to do.

## 2015-01-25 ENCOUNTER — Encounter: Payer: Self-pay | Admitting: Family

## 2015-01-25 ENCOUNTER — Telehealth: Payer: Self-pay | Admitting: Internal Medicine

## 2015-01-25 ENCOUNTER — Encounter: Payer: Self-pay | Admitting: Internal Medicine

## 2015-01-25 NOTE — Telephone Encounter (Signed)
Pt states the instructions for Liraglutide ([place]) 18 MG/3ML SOPN [[id number] are incorrect.  She wanted me to let you know she does not take this for diabetes.  Can you please call her

## 2015-01-29 NOTE — Telephone Encounter (Signed)
Pt states that she is not taking victoza for diabetes she is taking it for weight loss. Directions are supposed to be take 0.6 mg daily and increase by 0.6 mg weekly until 3.0 mg daily. Please advise.

## 2015-01-29 NOTE — Telephone Encounter (Signed)
I am aware that she is not taking this for diabetes. Generally dosing of the medication for weight loss is 3 mg daily. However, I have been informed that her insurance company will only cover 1.8 mg daily. Therefore it is fine if we use it at 1.8 mg daily unless she would like to talk with her insurance company about increasing the coverage.

## 2015-01-29 NOTE — Telephone Encounter (Signed)
Pt aware. Will be in tomorrow to see [name].

## 2015-01-30 ENCOUNTER — Ambulatory Visit (INDEPENDENT_AMBULATORY_CARE_PROVIDER_SITE_OTHER): Payer: 59 | Admitting: Internal Medicine

## 2015-01-30 ENCOUNTER — Encounter: Payer: Self-pay | Admitting: Internal Medicine

## 2015-01-30 DIAGNOSIS — I10 Essential (primary) hypertension: Secondary | ICD-10-CM

## 2015-01-30 DIAGNOSIS — G4733 Obstructive sleep apnea (adult) (pediatric): Secondary | ICD-10-CM

## 2015-01-30 DIAGNOSIS — Z23 Encounter for immunization: Secondary | ICD-10-CM | POA: Diagnosis not present

## 2015-01-30 DIAGNOSIS — Z1239 Encounter for other screening for malignant neoplasm of breast: Secondary | ICD-10-CM | POA: Diagnosis not present

## 2015-01-30 MED ORDER — LIRAGLUTIDE 18 MG/3ML ~~LOC~~ SOPN: 1.8000 mg | PEN_INJECTOR | Freq: Every day | SUBCUTANEOUS | Status: DC

## 2015-01-30 NOTE — Addendum Note (Signed)
Addended by: [name] A on: 01/30/2015 08:53 AM   Modules accepted: Orders

## 2015-01-30 NOTE — Addendum Note (Signed)
Addended by: Lowella Dandy on: 01/30/2015 11:25 AM   Modules accepted: Orders

## 2015-01-30 NOTE — Assessment & Plan Note (Signed)
Wt Readings from Last 3 Encounters:  01/30/15 241 lb 4 oz (109.43 kg)  01/02/15 253 lb (114.76 kg)  12/25/14 253 lb 12 oz (115.1 kg)   Body mass index is 40.15 kg/(m^2).  Reviewed ongoing efforts: carb reduced diet, meeting w/ nutritionist 2015, walking dog daily, swim/aerobic activity w/ friend Prior efforts to undergo bariatric evaluation in 2013 reviewed (while at [facility]), also is previously used been tearing that was poorly tolerated due to tachycardia and beta hCG injections for weight loss clinic which were ineffective/risk to cardiomyopathy co morbidities include arthralgias, OSA, HTN, CM Discussed potential med tx to support same - pt elected Saxenda injections early 12/2014 Insurance will cover "Victoza" rather than "Saxenda", so patient taking liraglutide at max dose 1/8mg /d at this time - loast 12 lb in 1st mo of same reviewed FDA difference and indication of same with patient today who understands difference and elects to remain on "Vicotza" - 90d supply sent to mail order The patient is asked to make continued attempt to improve diet and exercise patterns to aid in medical management of this problem.

## 2015-01-30 NOTE — Progress Notes (Signed)
Pre visit review using our clinic review tool, if applicable. No additional management support is needed unless otherwise documented below in the visit note. 

## 2015-01-30 NOTE — Progress Notes (Signed)
Subjective:    Patient ID: [name], female    DOB: January 10, 1976, 39 y.o.   MRN: [id number]  HPI  Patient here for follow-up. Reviewed chronic medical issues, interval events and current concerns  Past Medical History  Diagnosis Date  . Hypertension   . Ovarian cyst     1999; surgically removed  . OSA (obstructive sleep apnea)     on CPAP   . IBS (irritable bowel syndrome)   . 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 Depression   . Anxiety   . Fibroid     age 23  . Termination of pregnancy     due to cardiac risk  . DVT (deep venous thrombosis)     2014  . CHF (congestive heart failure)   . Gallstones     Review of Systems  Constitutional: Positive for fatigue (chronic, unchanged). Unexpected weight change: intentional loss, 12 lbs in past 30 days.  Respiratory: Negative for cough and shortness of breath.   Cardiovascular: Negative for chest pain and leg swelling.  Gastrointestinal: Negative for nausea, abdominal pain, diarrhea and constipation.       Objective:    Physical Exam  Constitutional: She appears well-developed and well-nourished. No distress.  obese  Cardiovascular: Normal rate, regular rhythm and normal heart sounds.   No murmur heard. Pulmonary/Chest: Effort normal and breath sounds normal. No respiratory distress.  Musculoskeletal: She exhibits no edema.  Vitals reviewed.   BP 120/80 mmHg  Pulse 51  Temp(Src) 97.9 F (36.6 C) (Oral)  Ht 5\' 5"  (1.651 m)  Wt 241 lb 4 oz (109.43 kg)  BMI 40.15 kg/m2  SpO2 96%  LMP 01/09/2015 Wt Readings from Last 3 Encounters:  01/30/15 241 lb 4 oz (109.43 kg)  01/02/15 253 lb (114.76 kg)  12/25/14 253 lb 12 oz (115.1 kg)    Lab Results  Component Value Date   WBC 9.6 10/28/2014   HGB 12.6 10/28/2014   HCT 37.6 10/28/2014   PLT 270 10/28/2014   GLUCOSE 103* 10/28/2014   CHOL 145 10/15/2014   TRIG 70.0 10/15/2014   HDL 46.50 10/15/2014   LDLCALC 85 10/15/2014   ALT 20  02/25/2014   AST 29 02/25/2014   NA 136 10/28/2014   K 3.7 10/28/2014   CL 101 10/28/2014   CREATININE 0.92 10/28/2014   BUN 6 10/28/2014   CO2 25 10/28/2014   TSH 1.03 10/15/2014   INR 0.93 02/26/2014    Dg Shoulder Right  01/02/2015   CLINICAL DATA:  Chronic pain. Limited range of motion. No known injury.  EXAM: RIGHT SHOULDER - 2+ VIEW  COMPARISON:  None.  FINDINGS: No fracture. No dislocation. No bone lesion. There is a subacromial spur. Small spur from the acromion at the [facility] to right. No other degenerative change. Glenohumeral joint is well maintained. Soft tissues are unremarkable.  IMPRESSION: No fracture or acute finding.  Subacromial spur.   Electronically Signed   By: Lajean Manes M.D.   On: 01/02/2015 12:57   [place] Extrem Up Right Ltd  01/03/2015   Procedure: Real-time Ultrasound Guided Injection of right glenohumeral  joint Device: GE Logiq E  Ultrasound guided injection is preferred based studies that show increased  duration, increased effect, greater accuracy, decreased procedural pain,  increased response rate with ultrasound guided versus blind injection.  Verbal informed consent obtained.  Time-out conducted.  Noted no overlying erythema, induration, or other signs of local  infection.  Skin  prepped in a sterile fashion.  Local anesthesia: Topical Ethyl chloride.  With sterile technique and under real time ultrasound guidance: Joint  visualized. 23g 1  [name] needle inserted posterior approach. Pictures  taken for needle placement. Patient did have injection of 2 cc of 1%  lidocaine, 2 cc of 0.5% Marcaine, and 1.0 cc of Kenalog 40 mg/dL. Completed without difficulty  Pain immediately resolved suggesting accurate placement of the medication.   Advised to call if fevers/chills, erythema, induration, drainage, or  persistent bleeding.  Images permanently stored and available for review in the ultrasound unit.   Impression: Technically successful ultrasound guided injection.         Assessment & Plan:    Problem List Items Addressed This Visit    Hypertension    BP Readings from Last 3 Encounters:  01/30/15 120/80  01/02/15 112/82  12/25/14 132/88   Historically resistant to med tx On ARB, spironolactone, coreg, and lasix since 02/2014 hosp for CHF [facility] The current medical regimen is effective;  continue present plan and medications      Obesity, morbid, BMI 40.0-49.9 - Primary    Wt Readings from Last 3 Encounters:  01/30/15 241 lb 4 oz (109.43 kg)  01/02/15 253 lb (114.76 kg)  12/25/14 253 lb 12 oz (115.1 kg)   Body mass index is 40.15 kg/(m^2).  Reviewed ongoing efforts: carb reduced diet, meeting w/ nutritionist 2015, walking dog daily, swim/aerobic activity w/ friend Prior efforts to undergo bariatric evaluation in 2013 reviewed (while at [facility]), also is previously used been tearing that was poorly tolerated due to tachycardia and beta hCG injections for weight loss clinic which were ineffective/risk to cardiomyopathy co morbidities include arthralgias, OSA, HTN, CM Discussed potential med tx to support same - pt elected Saxenda injections early 12/2014 Insurance will cover "Victoza" rather than "Saxenda", so patient taking liraglutide at max dose 1/8mg /d at this time - loast 12 lb in 1st mo of same reviewed FDA difference and indication of same with patient today who understands difference and elects to remain on "Vicotza" - 90d supply sent to mail order The patient is asked to make continued attempt to improve diet and exercise patterns to aid in medical management of this problem.      Relevant Medications   Liraglutide (VICTOZA) 18 MG/3ML SOPN   OSA (obstructive sleep apnea)    Uses CPAP qhs Reviewed 11/2014 follow up with pulm - no changes recommended           [name], MD

## 2015-01-30 NOTE — Assessment & Plan Note (Signed)
BP Readings from Last 3 Encounters:  01/30/15 120/80  01/02/15 112/82  12/25/14 132/88   Historically resistant to med tx On ARB, spironolactone, coreg, and lasix since 02/2014 hosp for CHF [facility] The current medical regimen is effective;  continue present plan and medications

## 2015-01-30 NOTE — Patient Instructions (Addendum)
It was good to see you today.  We have reviewed your prior records including labs and tests today  Medications reviewed and updated Continue Victoza, max 120 mg per day as discussed. No other changes recommended at this time. Refill on medication(s) as discussed today.  Annual flu shot update today  we'll make referral to [facility] for mammogram screening. Our office will contact you regarding appointment(s) once made.  Please schedule followup in 3-4 months for weight change, call sooner if problems.

## 2015-01-30 NOTE — Assessment & Plan Note (Signed)
Uses CPAP qhs Reviewed 11/2014 follow up with pulm - no changes recommended

## 2015-02-15 ENCOUNTER — Telehealth: Payer: Self-pay

## 2015-02-15 MED ORDER — LIRAGLUTIDE 18 MG/3ML ~~LOC~~ SOPN: 1.8000 mg | PEN_INJECTOR | Freq: Every day | SUBCUTANEOUS | Status: DC

## 2015-02-15 NOTE — Telephone Encounter (Signed)
Pt requesting 90 day supply of victoza. Please advise.

## 2015-02-15 NOTE — Telephone Encounter (Signed)
Medication sent to pharmacy  

## 2015-02-16 ENCOUNTER — Ambulatory Visit (INDEPENDENT_AMBULATORY_CARE_PROVIDER_SITE_OTHER): Payer: 59 | Admitting: Emergency Medicine

## 2015-02-16 ENCOUNTER — Ambulatory Visit (INDEPENDENT_AMBULATORY_CARE_PROVIDER_SITE_OTHER): Payer: 59

## 2015-02-16 VITALS — BP 118/72 | HR 103 | Temp 98.3°F | Resp 16 | Ht 65.0 in | Wt 241.0 lb

## 2015-02-16 DIAGNOSIS — S63639A Sprain of interphalangeal joint of unspecified finger, initial encounter: Secondary | ICD-10-CM | POA: Diagnosis not present

## 2015-02-16 DIAGNOSIS — M79644 Pain in right finger(s): Secondary | ICD-10-CM | POA: Diagnosis not present

## 2015-02-16 NOTE — Patient Instructions (Signed)
Finger Sprain  A finger sprain is a tear in one of the strong, fibrous tissues that connect the bones (ligaments) in your finger. The severity of the sprain depends on how much of the ligament is torn. The tear can be either partial or complete.  CAUSES   Often, sprains are a result of a fall or accident. If you extend your hands to catch an object or to protect yourself, the force of the impact causes the fibers of your ligament to stretch too much. This excess tension causes the fibers of your ligament to tear.  SYMPTOMS   You may have some loss of motion in your finger. Other symptoms include:   Bruising.   Tenderness.   Swelling.  DIAGNOSIS   In order to diagnose finger sprain, your caregiver will physically examine your finger or thumb to determine how torn the ligament is. Your caregiver may also suggest an X-ray exam of your finger to make sure no bones are broken.  TREATMENT   If your ligament is only partially torn, treatment usually involves keeping the finger in a fixed position (immobilization) for a short period. To do this, your caregiver will apply a bandage, cast, or splint to keep your finger from moving until it heals. For a partially torn ligament, the healing process usually takes 2 to 3 weeks.  If your ligament is completely torn, you may need surgery to reconnect the ligament to the bone. After surgery a cast or splint will be applied and will need to stay on your finger or thumb for 4 to 6 weeks while your ligament heals.  HOME CARE INSTRUCTIONS   Keep your injured finger elevated, when possible, to decrease swelling.   To ease pain and swelling, apply ice to your joint twice a day, for 2 to 3 days:   Put ice in a plastic bag.   Place a towel between your skin and the bag.   Leave the ice on for 15 minutes.   Only take over-the-counter or prescription medicine for pain as directed by your caregiver.   Do not wear rings on your injured finger.   Do not leave your finger unprotected  until pain and stiffness go away (usually 3 to 4 weeks).   Do not allow your cast or splint to get wet. Cover your cast or splint with a plastic bag when you shower or bathe. Do not swim.   Your caregiver may suggest special exercises for you to do during your recovery to prevent or limit permanent stiffness.  SEEK IMMEDIATE MEDICAL CARE IF:   Your cast or splint becomes damaged.   Your pain becomes worse rather than better.  MAKE SURE YOU:   Understand these instructions.   Will watch your condition.   Will get help right away if you are not doing well or get worse.  Document Released: 06/18/2004 Document Revised: 08/03/2011 Document Reviewed: 01/12/2011  Exit[employer] Patient Information 2015 [employer]. This information is not intended to replace advice given to you by your health care provider. Make sure you discuss any questions you have with your health care provider.

## 2015-02-16 NOTE — Progress Notes (Signed)
Subjective:  Patient ID: [name], female    DOB: 1976/04/30  Age: 39 y.o. MRN: [id number]  CC: Hand Pain   HPI [name] presents  patient injured herself yesterday descending a flight of stairs. She got her right small finger caught between the banister and the wall. She fell. Now she has pain in her distal interphalangeal joint of the right small finger. She has no deformity or ecchymosis. He has pain with movement  History [name] has a past medical history of Hypertension; Ovarian cyst; OSA (obstructive sleep apnea); IBS (irritable bowel syndrome); Postpartum cardiomyopathy; Depression; Anxiety; Fibroid; Termination of pregnancy; DVT (deep venous thrombosis); CHF (congestive heart failure); and Gallstones.   She has past surgical history that includes Abdominal surgery; Cesarean section; Cholecystectomy; Foot surgery (Right); Tubal ligation (1999); and left heart catheterization with coronary angiogram (N/A, 02/26/2014).   Her  family history includes Allergies in her daughter; Emphysema in her maternal grandmother; Heart disease (age of onset: 48) in her maternal grandmother; Rheum arthritis in her mother.  She   reports that she has never smoked. She has never used smokeless tobacco. She reports that she does not drink alcohol or use illicit drugs.  Outpatient Prescriptions Prior to Visit  Medication Sig Dispense Refill  . albuterol (PROVENTIL HFA;VENTOLIN HFA) 108 (90 BASE) MCG/ACT inhaler Inhale 2 puffs into the lungs every 6 (six) hours as needed for wheezing or shortness of breath. 1 Inhaler 0  . aspirin 81 MG tablet Take 81 mg by mouth daily.    . carvedilol (COREG) 25 MG tablet Take 0.5 tablets (12.5 mg total) by mouth 3 (three) times daily. 180 tablet 1  . colesevelam (WELCHOL) 625 MG tablet Take 1 tablet (625 mg total) by mouth 3 (three) times daily with meals. 270 tablet 1  . cyclobenzaprine (FLEXERIL) 10 MG tablet Take 1 tablet (10 mg total) by mouth 3 (three)  times daily as needed for muscle spasms. 30 tablet 0  . diazepam (VALIUM) 10 MG tablet Take 10 mg by mouth at bedtime as needed for anxiety.    . fluticasone (FLONASE) 50 MCG/ACT nasal spray Place 1 spray into both nostrils at bedtime. Before donning CPAP 16 g 2  . furosemide (LASIX) 40 MG tablet Take 2 tablets (80 mg total) by mouth 2 (two) times daily. 120 tablet 12  . gabapentin (NEURONTIN) 100 MG capsule Take 100 mg by mouth as needed.    . gabapentin (NEURONTIN) 300 MG capsule Take 300 mg by mouth at bedtime.    . Insulin Pen Needle (PEN NEEDLES) 32G X 4 MM MISC Inject 1 pen into the skin daily as needed (to administer insulin.). 100 each 3  . Levomilnacipran HCl ER 120 MG CP24 Take 120 mg by mouth.    . Liraglutide (VICTOZA) 18 MG/3ML SOPN Inject 0.3 mLs (1.8 mg total) into the skin daily. 27 mL 1  . losartan (COZAAR) 100 MG tablet Take 1 tablet (100 mg total) by mouth daily. 90 tablet 3  . potassium chloride SA (K-DUR,KLOR-CON) 20 MEQ tablet Take 2 tablets (40 mEq total) by mouth 2 (two) times daily. 124 tablet 12  . spironolactone (ALDACTONE) 25 MG tablet Take 1 tablet (25 mg total) by mouth daily. 90 tablet 3  . sucralfate (CARAFATE) 1 G tablet Take 1 g by mouth daily.    . Vitamin D, Ergocalciferol, (DRISDOL) 50000 UNITS CAPS capsule Take 1 capsule (50,000 Units total) by mouth every 7 (seven) days. 8 capsule 0  No facility-administered medications prior to visit.    Social History   Social History  . Marital Status: Married    Spouse Name: N/A  . Number of Children: 1  . Years of Education: N/A   Occupational History  . stay at home mom    Social History Main Topics  . Smoking status: Never Smoker   . Smokeless tobacco: Never Used  . Alcohol Use: No  . Drug Use: No  . Sexual Activity: Not Asked   Other Topics Concern  . None   Social History Narrative     Review of Systems  Constitutional: Negative for fever, chills and appetite change.  HENT: Negative for  congestion, ear pain, postnasal drip, sinus pressure and sore throat.   Eyes: Negative for pain and redness.  Respiratory: Negative for cough, shortness of breath and wheezing.   Cardiovascular: Negative for leg swelling.  Gastrointestinal: Negative for nausea, vomiting, abdominal pain, diarrhea, constipation and blood in stool.  Endocrine: Negative for polyuria.  Genitourinary: Negative for dysuria, urgency, frequency and flank pain.  Musculoskeletal: Negative for gait problem.  Skin: Negative for rash.  Neurological: Negative for weakness and headaches.  Psychiatric/Behavioral: Negative for confusion and decreased concentration. The patient is not nervous/anxious.     Objective:  BP 118/72 mmHg  Pulse 103  Temp(Src) 98.3 F (36.8 C) (Oral)  Resp 16  Ht 5\' 5"  (1.651 m)  Wt 241 lb (109.317 kg)  BMI 40.10 kg/m2  SpO2 97%  LMP 01/09/2015  Physical Exam  Constitutional: She is oriented to person, place, and time. She appears well-developed and well-nourished.  HENT:  Head: Normocephalic and atraumatic.  Eyes: Conjunctivae are normal. Pupils are equal, round, and reactive to light.  Pulmonary/Chest: Effort normal.  Musculoskeletal: She exhibits no edema.  Neurological: She is alert and oriented to person, place, and time.  Skin: Skin is dry.  Psychiatric: She has a normal mood and affect. Her behavior is normal. Thought content normal.   she is very tender at the DIP joint of the right small finger no deformity or ecchymosis    Assessment & Plan:   [name] was seen today for hand pain.  Diagnoses and all orders for this visit:  Interphalangeal joint, hand sprain, initial encounter -     DG Finger Little Right; Future   I am having [name] maintain her potassium chloride SA, fluticasone, sucralfate, spironolactone, furosemide, diazepam, losartan, albuterol, aspirin, Levomilnacipran HCl ER, gabapentin, gabapentin, colesevelam, carvedilol, cyclobenzaprine, Vitamin D  (Ergocalciferol), Pen Needles, and Liraglutide.  No orders of the defined types were placed in this encounter.    She was buddy taped will follow-up in 3 weeks.  Appropriate red flag conditions were discussed with the patient as well as actions that should be taken.  Patient expressed his understanding.  Follow-up: Return if symptoms worsen or fail to improve.  Roselee Culver, MD   [facility] reading (PRIMARY) by  Dr. Ouida Sills negative.

## 2015-02-27 ENCOUNTER — Ambulatory Visit: Payer: Self-pay | Admitting: Family Medicine

## 2015-02-27 ENCOUNTER — Ambulatory Visit: Payer: Self-pay | Admitting: Internal Medicine

## 2015-02-27 DIAGNOSIS — Z0289 Encounter for other administrative examinations: Secondary | ICD-10-CM

## 2015-03-08 NOTE — Progress Notes (Signed)
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 Last echocardiogram September 2015 showed ejection fraction 14-43%, grade 2 diastolic dysfunction, mild left atrial enlargement and mild tricuspid regurgitation. VQ scan September 2015 low probability. Nuclear study September 2015 showed anterior ischemia and ejection fraction 39%. Cardiac catheterization October 2015 showed normal coronary arteries and left ventricular end-diastolic pressure 16. Since last seen the patient has dyspnea with more extreme activities but not with routine activities. It is relieved with rest. It is not associated with chest pain. There is no orthopnea, PND or pedal edema. There is no syncope or palpitations. There is no exertional chest pain.   Current Outpatient Prescriptions  Medication Sig Dispense Refill  . albuterol (PROVENTIL HFA;VENTOLIN HFA) 108 (90 BASE) MCG/ACT inhaler Inhale 2 puffs into the lungs every 6 (six) hours as needed for wheezing or shortness of breath. 1 Inhaler 0  . aspirin 81 MG tablet Take 81 mg by mouth daily.    . carvedilol (COREG) 25 MG tablet Take 0.5 tablets (12.5 mg total) by mouth 3 (three) times daily. 180 tablet 1  . colesevelam (WELCHOL) 625 MG tablet Take 1 tablet (625 mg total) by mouth 3 (three) times daily with meals. 270 tablet 1  . cyclobenzaprine (FLEXERIL) 10 MG tablet Take 1 tablet (10 mg total) by mouth 3 (three) times daily as needed for muscle spasms. 30 tablet 0  . diazepam (VALIUM) 10 MG tablet Take 10 mg by mouth at bedtime as needed for anxiety.    . fluticasone (FLONASE) 50 MCG/ACT nasal spray Place 1 spray into both nostrils at bedtime. Before donning CPAP 16 g 2  . furosemide (LASIX) 40 MG tablet Take 2 tablets (80 mg total) by mouth 2 (two) times daily. 120 tablet 12  . gabapentin (NEURONTIN) 100 MG capsule Take 100 mg by mouth as needed.    . gabapentin (NEURONTIN) 300 MG capsule Take 300 mg by mouth at bedtime.    .  Insulin Pen Needle (PEN NEEDLES) 32G X 4 MM MISC Inject 1 pen into the skin daily as needed (to administer insulin.). 100 each 3  . Levomilnacipran HCl ER 120 MG CP24 Take 120 mg by mouth.    . Liraglutide (VICTOZA) 18 MG/3ML SOPN Inject 0.3 mLs (1.8 mg total) into the skin daily. 27 mL 1  . losartan (COZAAR) 100 MG tablet Take 1 tablet (100 mg total) by mouth daily. 90 tablet 3  . potassium chloride SA (K-DUR,KLOR-CON) 20 MEQ tablet Take 2 tablets (40 mEq total) by mouth 2 (two) times daily. 124 tablet 12  . spironolactone (ALDACTONE) 25 MG tablet Take 1 tablet (25 mg total) by mouth daily. 90 tablet 3  . sucralfate (CARAFATE) 1 G tablet Take 1 g by mouth daily.    . Vitamin D, Ergocalciferol, (DRISDOL) 50000 UNITS CAPS capsule Take 1 capsule (50,000 Units total) by mouth every 7 (seven) days. 8 capsule 0  . zaleplon (SONATA) 10 MG capsule Take 20 mg by mouth at bedtime.  3   No current facility-administered medications for this visit.     Past Medical History  Diagnosis Date  . Hypertension   . Ovarian cyst     1999; surgically removed  . OSA (obstructive sleep apnea)     on CPAP   . IBS (irritable bowel syndrome)   . 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 Depression   . Anxiety   . Fibroid  age 74  . Termination of pregnancy     due to cardiac risk  . DVT (deep venous thrombosis) ([place])     2014  . CHF (congestive heart failure) ([place])   . Gallstones     Past Surgical History  Procedure Laterality Date  . Abdominal surgery      several  . Cesarean section    . Cholecystectomy    . Foot surgery Right   . Tubal ligation  1999  . Left heart catheterization with coronary angiogram N/A 02/26/2014    Procedure: LEFT HEART CATHETERIZATION WITH CORONARY ANGIOGRAM;  Surgeon: Jettie Booze, MD;  Location: [facility] CATH LAB;  Service: Cardiovascular;  Laterality: N/A;    Social History   Social History  . Marital Status: Married    Spouse Name: N/A    . Number of Children: 1  . Years of Education: N/A   Occupational History  . stay at home mom    Social History Main Topics  . Smoking status: Never Smoker   . Smokeless tobacco: Never Used  . Alcohol Use: No  . Drug Use: No  . Sexual Activity: Not on file   Other Topics Concern  . Not on file   Social History Narrative    ROS: no fevers or chills, productive cough, hemoptysis, dysphasia, odynophagia, melena, hematochezia, dysuria, hematuria, rash, seizure activity, orthopnea, PND, pedal edema, claudication. Remaining systems are negative.  Physical Exam: Well-developed obese in no acute distress.  Skin is warm and dry.  HEENT is normal.  Neck is supple.  Chest is clear to auscultation with normal expansion.  Cardiovascular exam is regular rate and rhythm.  Abdominal exam nontender or distended. No masses palpated. Extremities show no edema. neuro grossly intact

## 2015-03-11 ENCOUNTER — Ambulatory Visit (INDEPENDENT_AMBULATORY_CARE_PROVIDER_SITE_OTHER): Payer: 59 | Admitting: Cardiology

## 2015-03-11 ENCOUNTER — Encounter: Payer: Self-pay | Admitting: Cardiology

## 2015-03-11 VITALS — BP 134/90 | HR 100 | Ht 65.0 in | Wt 241.2 lb

## 2015-03-11 DIAGNOSIS — I10 Essential (primary) hypertension: Secondary | ICD-10-CM | POA: Diagnosis not present

## 2015-03-11 LAB — BASIC METABOLIC PANEL
BUN: 6 mg/dL — ABNORMAL LOW (ref 7–25)
CO2: 23 mmol/L (ref 20–31)
Calcium: 9.6 mg/dL (ref 8.6–10.2)
Chloride: 106 mmol/L (ref 98–110)
Creat: 0.77 mg/dL (ref 0.50–1.10)
Glucose, Bld: 83 mg/dL (ref 65–99)
Potassium: 4.4 mmol/L (ref 3.5–5.3)
Sodium: 140 mmol/L (ref 135–146)

## 2015-03-11 MED ORDER — CARVEDILOL 25 MG PO TABS: 25.0000 mg | ORAL_TABLET | Freq: Two times a day (BID) | ORAL | Status: DC

## 2015-03-11 NOTE — Assessment & Plan Note (Signed)
Patient appears to be euvolemic on examination. Continue present dose of Lasix and spironolactone. Check potassium and renal function.

## 2015-03-11 NOTE — Assessment & Plan Note (Signed)
Continue ARB. Increase carvedilol to 25 mg twice a day.

## 2015-03-11 NOTE — Patient Instructions (Signed)
Medication Instructions:   INCREASE CARVEDILOL TO 25 MG TWICE DAILY  Labwork:  Your physician recommends that you HAVE LAB WORK TODAY  Follow-Up:  Your physician wants you to follow-up in: [place] will receive a reminder letter in the mail two months in advance. If you don't receive a letter, please call our office to schedule the follow-up appointment.

## 2015-03-11 NOTE — Assessment & Plan Note (Signed)
Blood pressure mildly elevated. Increase Coreg to 25 mg twice a day and follow.

## 2015-03-18 ENCOUNTER — Ambulatory Visit: Payer: 59 | Admitting: Family Medicine

## 2015-03-18 DIAGNOSIS — Z0289 Encounter for other administrative examinations: Secondary | ICD-10-CM

## 2015-03-20 ENCOUNTER — Ambulatory Visit: Payer: 59 | Admitting: Family Medicine

## 2015-04-04 ENCOUNTER — Ambulatory Visit (INDEPENDENT_AMBULATORY_CARE_PROVIDER_SITE_OTHER): Payer: 59 | Admitting: Family Medicine

## 2015-04-04 ENCOUNTER — Encounter: Payer: Self-pay | Admitting: Family Medicine

## 2015-04-04 VITALS — BP 108/80 | HR 92 | Ht 65.0 in | Wt 238.0 lb

## 2015-04-04 DIAGNOSIS — M940 Chondrocostal junction syndrome [Tietze]: Secondary | ICD-10-CM

## 2015-04-04 DIAGNOSIS — M9901 Segmental and somatic dysfunction of cervical region: Secondary | ICD-10-CM

## 2015-04-04 DIAGNOSIS — S43431A Superior glenoid labrum lesion of right shoulder, initial encounter: Secondary | ICD-10-CM | POA: Diagnosis not present

## 2015-04-04 DIAGNOSIS — S43439A Superior glenoid labrum lesion of unspecified shoulder, initial encounter: Secondary | ICD-10-CM | POA: Insufficient documentation

## 2015-04-04 DIAGNOSIS — M9902 Segmental and somatic dysfunction of thoracic region: Secondary | ICD-10-CM | POA: Diagnosis not present

## 2015-04-04 DIAGNOSIS — M9908 Segmental and somatic dysfunction of rib cage: Secondary | ICD-10-CM | POA: Diagnosis not present

## 2015-04-04 DIAGNOSIS — M999 Biomechanical lesion, unspecified: Secondary | ICD-10-CM

## 2015-04-04 HISTORY — DX: Superior glenoid labrum lesion of unspecified shoulder, initial encounter: S43.439A

## 2015-04-04 NOTE — Progress Notes (Signed)
[name] [place],  [address] Phone: [phone] Subjective:     CC: Right shoulder pain, neck pain follow up  [id number] [name] is a 39 y.o. female coming in with complaint of right shoulder neck pain Patient is having aggravating symptoms that is keeping her up at night.  There is concerned the patient does have a labral tear in her shoulder. Continues to have discomfort. We'll another injection. We did not when any surgical intervention such no advance imaging is necessary.      patient also has some chronic neck pain.  Patient has been responding very well to osteopathic manipulation therapy. We discussed different treatment options previously. Seems to always seem to improve though with the manipulation. No significant radiation down the arm or any numbness or weakness.    Past medical history, social, surgical and family history all reviewed in electronic medical record.   Review of Systems: No headache, visual changes, nausea, vomiting, diarrhea, constipation, dizziness, abdominal pain, skin rash, fevers, chills, night sweats, weight loss, swollen lymph nodes, body aches, joint swelling, muscle aches, chest pain, shortness of breath, mood changes.   Objective Blood pressure 108/80, pulse 92, height 5\' 5"  (1.651 m), weight 238 lb (107.956 kg), SpO2 99 %.  General: No apparent distress alert and oriented x3 mood and affect normal, dressed appropriately.  HEENT: Pupils equal, extraocular movements intact  Respiratory: [name] speak in full sentences and does not appear short of breath  Cardiovascular: No lower extremity edema, non tender, no erythema  Skin: Warm dry intact with no signs of infection or rash on extremities or on axial skeleton.  Abdomen: Soft nontender  Neuro: Cranial nerves II through XII are intact, neurovascularly intact in all extremities with 2+ DTRs and 2+ pulses.  Lymph: No lymphadenopathy of  posterior or anterior cervical chain or axillae bilaterally.  Gait normal with good balance and coordination.  MSK:  Non tender with full range of motion and good stability and symmetric strength and tone of  elbows, wrist, hip, and knee,  ankles bilaterally.   Shoulder: Right Inspection reveals no abnormalities, atrophy or asymmetry. Palpation is normal with no tenderness over AC joint or bicipital groove. ROM is full in all planes passively. Rotator cuff strength normal throughout. Minimal impingement sign still present Speeds and Yergason's tests normal. Positive labral pathology Normal scapular function observed. No painful arc and no drop arm sign. No apprehension sign Contralateral shoulder unremarkable  no change from previous exam  Osteopathic findings Cervical C2 flexed rotated and side bent right C4 flexed rotated and side bent left  Thoracic T1 extended rotated and side bent right with elevated first rib T3 extended rotated and side bent left  T7 extended rotated and side bent left  Procedure: Real-time Ultrasound Guided Injection of right glenohumeral joint Device: GE Logiq E  Ultrasound guided injection is preferred based studies that show increased duration, increased effect, greater accuracy, decreased procedural pain, increased response rate with ultrasound guided versus blind injection.  Verbal informed consent obtained.  Time-out conducted.  Noted no overlying erythema, induration, or other signs of local infection.  Skin prepped in a sterile fashion.  Local anesthesia: Topical Ethyl chloride.  With sterile technique and under real time ultrasound guidance:  Joint visualized.  23g 1  inch needle inserted posterior approach. Pictures taken for needle placement. Patient did have injection of 2 cc of 1% lidocaine, 2 cc of 0.5% Marcaine, and 1.0 cc of Kenalog  40 mg/dL. Completed without difficulty  Pain immediately resolved suggesting accurate placement of the  medication.  Advised to call if fevers/chills, erythema, induration, drainage, or persistent bleeding.  Images permanently stored and available for review in the ultrasound unit.  Impression: Technically successful ultrasound guided injection.     Impression and Recommendations:     This case required medical decision making of moderate complexity.

## 2015-04-04 NOTE — Assessment & Plan Note (Signed)
Decision today to treat with OMT was based on Physical Exam  After verbal consent patient was treated with HVLa, FPR techniques in cervical, thoracic and rib areas  Patient tolerated the procedure well with improvement in symptoms  Patient given exercises, stretches and lifestyle modifications  See medications in patient instructions if given  Patient will follow up in 4-6 weeks

## 2015-04-04 NOTE — Assessment & Plan Note (Signed)
Given injection today and tolerated the procedure well. Encourage patient to continue to monitor for any signs of symptoms. Patient does not want to have any significant intervention done at this time. Patient has done formal physical therapy. We will continue to monitor closely.

## 2015-04-04 NOTE — Progress Notes (Signed)
Pre visit review using our clinic review tool, if applicable. No additional management support is needed unless otherwise documented below in the visit note. 

## 2015-04-04 NOTE — Patient Instructions (Signed)
Verbal introduction 0

## 2015-04-04 NOTE — Assessment & Plan Note (Signed)
Patient does respond fairly well to osteopathic manipulation. I do think that patient's breast tissue  Contribute to more strain. Patient also has some depression and anxiety and I think there is a psychosomatic amount. We discussed with patient about the importance of the posture and the home exercises which she is not doing on a regular basis. We discussed icing regimen. Patient is going to try to make different changes and be more diligently on doing the exercises that I would like patient come back and see me again in 4-6 weeks.

## 2015-04-09 ENCOUNTER — Ambulatory Visit: Payer: Self-pay | Admitting: Internal Medicine

## 2015-04-09 DIAGNOSIS — Z0289 Encounter for other administrative examinations: Secondary | ICD-10-CM

## 2015-04-15 ENCOUNTER — Ambulatory Visit: Payer: Self-pay | Admitting: Family Medicine

## 2015-04-24 ENCOUNTER — Ambulatory Visit (INDEPENDENT_AMBULATORY_CARE_PROVIDER_SITE_OTHER): Payer: 59 | Admitting: Family Medicine

## 2015-04-24 ENCOUNTER — Encounter: Payer: Self-pay | Admitting: Family Medicine

## 2015-04-24 VITALS — BP 104/82 | HR 51 | Ht 65.0 in | Wt 238.0 lb

## 2015-04-24 DIAGNOSIS — M9901 Segmental and somatic dysfunction of cervical region: Secondary | ICD-10-CM | POA: Diagnosis not present

## 2015-04-24 DIAGNOSIS — F418 Other specified anxiety disorders: Secondary | ICD-10-CM

## 2015-04-24 DIAGNOSIS — M940 Chondrocostal junction syndrome [Tietze]: Secondary | ICD-10-CM

## 2015-04-24 DIAGNOSIS — M999 Biomechanical lesion, unspecified: Secondary | ICD-10-CM

## 2015-04-24 MED ORDER — VENLAFAXINE HCL ER 75 MG PO CP24: 75.0000 mg | ORAL_CAPSULE | Freq: Every day | ORAL | Status: DC

## 2015-04-24 NOTE — Assessment & Plan Note (Signed)
I do feel that some of patient's neck pain is secondary to the poor posture as well as patient slipped rib syndrome. We did adjust this again today. Responded significantly better. We discussed icing regimen. Discussed ergonomics at work. I do feel that this could be multifactorial with increasing anxiety and depression the could also be positive. We did change medications. Warned of potential side effects. Return to clinic in 3-4 weeks.

## 2015-04-24 NOTE — Progress Notes (Signed)
Pre visit review using our clinic review tool, if applicable. No additional management support is needed unless otherwise documented below in the visit note. 

## 2015-04-24 NOTE — Assessment & Plan Note (Signed)
Patient was given Effexor. We'll stop her other medication and was not making any improvement. Patient and will come back in 3 weeks. There is no significant problems.

## 2015-04-24 NOTE — Patient Instructions (Addendum)
Good to see you Ice is your friend Keep up with the posture.  Stop the one medicine and start the effexor 75mg  daily.  Call me if side effects. After one week then stop the gabapentin as well See me again in 2-3 weeks.  [name].

## 2015-04-24 NOTE — Progress Notes (Signed)
  [name] [name], [place] [address] Phone: [phone] Subjective:     CC: Right shoulder pain, neck pain follow up  [id number] Megan CALLICUTT is a 39 y.o. female coming in with complaint of right shoulder neck pain Patient is having aggravating symptoms that is keeping her up at night.  There is concerned the patient does have a labral tear in her shoulder. Given an injection previously and states that she is doing significantly better. Still some mild pain overall.      patient also has some chronic neck pain.  Patient has been responding very well to osteopathic manipulation therapy. Having more pain. Likely some secondary to her not doing her exercises regularly. Continues to have difficulty overall with doing the postural exercises. Patient has had some increasing stress as well. Some anxiety as well. Feels the gabapentin is not helping. X-ray of her neck is only positive for some minimal arthritis.    Past medical history, social, surgical and family history all reviewed in electronic medical record.   Review of Systems: No headache, visual changes, nausea, vomiting, diarrhea, constipation, dizziness, abdominal pain, skin rash, fevers, chills, night sweats, weight loss, swollen lymph nodes, body aches, joint swelling, muscle aches, chest pain, shortness of breath, mood changes.   Objective Blood pressure 104/82, pulse 51, height 5\' 5"  (1.651 m), weight 238 lb (107.956 kg), SpO2 97 %.  General: No apparent distress alert and oriented x3 mood and affect normal, dressed appropriately.  HEENT: Pupils equal, extraocular movements intact  Respiratory: [name] speak in full sentences and does not appear short of breath  Cardiovascular: No lower extremity edema, non tender, no erythema  Skin: Warm dry intact with no signs of infection or rash on extremities or on axial skeleton.  Abdomen: Soft nontender  Neuro: Cranial nerves II through  XII are intact, neurovascularly intact in all extremities with 2+ DTRs and 2+ pulses.  Lymph: No lymphadenopathy of posterior or anterior cervical chain or axillae bilaterally.  Gait normal with good balance and coordination.  MSK:  Non tender with full range of motion and good stability and symmetric strength and tone of  elbows, wrist, hip, and knee,  ankles bilaterally.   Shoulder: Right Inspection reveals no abnormalities, atrophy or asymmetry. Palpation is normal with no tenderness over AC joint or bicipital groove. ROM is full in all planes passively. Rotator cuff strength normal throughout. Minimal impingement sign still present Speeds and Yergason's tests normal. Positive labral pathology still present Normal scapular function observed. No painful arc and no drop arm sign. No apprehension sign Contralateral shoulder unremarkable  no change from previous exam  Osteopathic findings Cervical C2 flexed rotated and side bent right C4 flexed rotated and side bent left C6 flexed rotated and side bent right  Thoracic T1 extended rotated and side bent right with elevated first rib T3 extended rotated and side bent left  T7 extended rotated and side bent left      Impression and Recommendations:     This case required medical decision making of moderate complexity.

## 2015-04-24 NOTE — Assessment & Plan Note (Signed)
Decision today to treat with OMT was based on Physical Exam  After verbal consent patient was treated with HVLA, ME, FPR techniques in cervical, thoracic and rib areas  Patient tolerated the procedure well with improvement in symptoms  Patient given exercises, stretches and lifestyle modifications  See medications in patient instructions if given  Patient will follow up in 3-4 weeks      

## 2015-04-25 ENCOUNTER — Emergency Department (HOSPITAL_COMMUNITY): Payer: 59

## 2015-04-25 ENCOUNTER — Other Ambulatory Visit: Payer: Self-pay

## 2015-04-25 ENCOUNTER — Emergency Department (HOSPITAL_COMMUNITY)
Admission: EM | Admit: 2015-04-25 | Discharge: 2015-04-26 | Disposition: A | Discharge status: Home / Self Care | Payer: 59 | Attending: Emergency Medicine | Admitting: Emergency Medicine

## 2015-04-25 ENCOUNTER — Encounter (HOSPITAL_COMMUNITY): Payer: Self-pay | Admitting: Emergency Medicine

## 2015-04-25 DIAGNOSIS — Z7951 Long term (current) use of inhaled steroids: Secondary | ICD-10-CM | POA: Diagnosis not present

## 2015-04-25 DIAGNOSIS — I509 Heart failure, unspecified: Secondary | ICD-10-CM | POA: Insufficient documentation

## 2015-04-25 DIAGNOSIS — Z8742 Personal history of other diseases of the female genital tract: Secondary | ICD-10-CM | POA: Diagnosis not present

## 2015-04-25 DIAGNOSIS — F419 Anxiety disorder, unspecified: Secondary | ICD-10-CM | POA: Insufficient documentation

## 2015-04-25 DIAGNOSIS — Z7982 Long term (current) use of aspirin: Secondary | ICD-10-CM | POA: Diagnosis not present

## 2015-04-25 DIAGNOSIS — Z8719 Personal history of other diseases of the digestive system: Secondary | ICD-10-CM | POA: Diagnosis not present

## 2015-04-25 DIAGNOSIS — Z9981 Dependence on supplemental oxygen: Secondary | ICD-10-CM | POA: Diagnosis not present

## 2015-04-25 DIAGNOSIS — Z79899 Other long term (current) drug therapy: Secondary | ICD-10-CM | POA: Insufficient documentation

## 2015-04-25 DIAGNOSIS — R0789 Other chest pain: Secondary | ICD-10-CM | POA: Diagnosis not present

## 2015-04-25 DIAGNOSIS — I10 Essential (primary) hypertension: Secondary | ICD-10-CM | POA: Diagnosis not present

## 2015-04-25 DIAGNOSIS — Z7984 Long term (current) use of oral hypoglycemic drugs: Secondary | ICD-10-CM | POA: Diagnosis not present

## 2015-04-25 DIAGNOSIS — G4733 Obstructive sleep apnea (adult) (pediatric): Secondary | ICD-10-CM | POA: Diagnosis not present

## 2015-04-25 DIAGNOSIS — R079 Chest pain, unspecified: Secondary | ICD-10-CM | POA: Diagnosis present

## 2015-04-25 DIAGNOSIS — Z86018 Personal history of other benign neoplasm: Secondary | ICD-10-CM | POA: Insufficient documentation

## 2015-04-25 DIAGNOSIS — Z86718 Personal history of other venous thrombosis and embolism: Secondary | ICD-10-CM | POA: Diagnosis not present

## 2015-04-25 DIAGNOSIS — F329 Major depressive disorder, single episode, unspecified: Secondary | ICD-10-CM | POA: Insufficient documentation

## 2015-04-25 DIAGNOSIS — Z794 Long term (current) use of insulin: Secondary | ICD-10-CM | POA: Diagnosis not present

## 2015-04-25 LAB — CBC
HCT: 40.6 % (ref 36.0–46.0)
Hemoglobin: 13.1 g/dL (ref 12.0–15.0)
MCH: 28.8 pg (ref 26.0–34.0)
MCHC: 32.3 g/dL (ref 30.0–36.0)
MCV: 89.2 fL (ref 78.0–100.0)
Platelets: 276 10*3/uL (ref 150–400)
RBC: 4.55 MIL/uL (ref 3.87–5.11)
RDW: 13.6 % (ref 11.5–15.5)
WBC: 7.7 10*3/uL (ref 4.0–10.5)

## 2015-04-25 LAB — BASIC METABOLIC PANEL
Anion gap: 10 (ref 5–15)
BUN: 6 mg/dL (ref 6–20)
CO2: 27 mmol/L (ref 22–32)
Calcium: 9.4 mg/dL (ref 8.9–10.3)
Chloride: 100 mmol/L — ABNORMAL LOW (ref 101–111)
Creatinine, Ser: 0.82 mg/dL (ref 0.44–1.00)
GFR calc Af Amer: >60 mL/min (ref >60)
GFR calc non Af Amer: >60 mL/min (ref >60)
Glucose, Bld: 92 mg/dL (ref 65–99)
Potassium: 3.5 mmol/L (ref 3.5–5.1)
Sodium: 137 mmol/L (ref 135–145)

## 2015-04-25 LAB — I-STAT TROPONIN, ED: Troponin i, poc: 0 ng/mL (ref 0.00–0.08)

## 2015-04-25 MED ORDER — MORPHINE SULFATE (PF) 4 MG/ML IV SOLN
4.0000 mg | Freq: Once | INTRAVENOUS | Status: DC
  Filled 2015-04-25: qty 1

## 2015-04-25 MED ORDER — MORPHINE SULFATE (PF) 4 MG/ML IV SOLN
4.0000 mg | Freq: Once | INTRAVENOUS | Status: AC
Start: 2015-04-25 — End: 2015-04-25
  Administered 2015-04-25: 4 mg via INTRAMUSCULAR

## 2015-04-25 NOTE — ED Notes (Signed)
No EDP had signed up for pt yet, asked Gerald Stabs PA to sign up and please go see pt. Informed them that pt had been sitting in room for 1hr

## 2015-04-25 NOTE — ED Notes (Addendum)
Pt called out.  States she "doesn't feel right".  Pt c/o calf cramping, nausea, abdominal pain/pressure, feeling hot.  PA made aware, this RN in room monitoring and reassuring patient at this time.

## 2015-04-25 NOTE — ED Notes (Signed)
Pt. reports mid/left chest pain with SOB , dry cough and diaphoresis onset yesterday , denies emesis .

## 2015-04-26 LAB — BRAIN NATRIURETIC PEPTIDE: B Natriuretic Peptide: 61.4 pg/mL (ref 0.0–100.0)

## 2015-04-26 LAB — I-STAT TROPONIN, ED: Troponin i, poc: 0 ng/mL (ref 0.00–0.08)

## 2015-04-26 MED ORDER — ONDANSETRON 4 MG PO TBDP
4.0000 mg | ORAL_TABLET | Freq: Once | ORAL | Status: AC
  Administered 2015-04-26: 4 mg via ORAL
  Filled 2015-04-26: qty 1

## 2015-04-26 MED ORDER — HYDROCODONE-ACETAMINOPHEN 5-325 MG PO TABS: 1.0000 | ORAL_TABLET | Freq: Four times a day (QID) | ORAL | Status: DC | PRN

## 2015-04-26 NOTE — Discharge Instructions (Signed)
Your testing here tonight did not show any significant abnormalities.  The cardiac testing did not show any heart-related issues at this time.  Follow-up your primary care doctor.  Return here as needed

## 2015-04-26 NOTE — ED Provider Notes (Signed)
CSN: [id number]     Arrival date & time 04/25/15  2002 History   First MD Initiated Contact with Patient 04/25/15 2202     Chief Complaint  Patient presents with  . Chest Pain     (Consider location/radiation/quality/duration/timing/severity/associated sxs/prior Treatment) HPI Patient presents to the emergency department with chest pain that started last night.  Patient states she is lying in bed, started having chest pain since been constant since that time.  States the pain does wax and wane in severity, but is never gone away.  She states she has generalized weakness.  Patient denies shortness breath, nausea, vomiting, dizziness, headache, blurred vision, back pain, neck pain, fever, rhinorrhea, sore throat, dysuria, incontinence, bloody stool, hematemesis, near syncope or syncope.  The patient states she has had some cough as well.  She states she has had congestive heart failure in the past due to cardiomyopathy Past Medical History  Diagnosis Date  . Hypertension   . Ovarian cyst     1999; surgically removed  . OSA (obstructive sleep apnea)     on CPAP   . IBS (irritable bowel syndrome)   . 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 Depression   . Anxiety   . Fibroid     age 36  . Termination of pregnancy     due to cardiac risk  . DVT (deep venous thrombosis) ([place])     2014  . CHF (congestive heart failure) ([place])   . Gallstones    Past Surgical History  Procedure Laterality Date  . Abdominal surgery      several  . Cesarean section    . Cholecystectomy    . Foot surgery Right   . Tubal ligation  1999  . Left heart catheterization with coronary angiogram N/A 02/26/2014    Procedure: LEFT HEART CATHETERIZATION WITH CORONARY ANGIOGRAM;  Surgeon: Jettie Booze, MD;  Location: [facility] CATH LAB;  Service: Cardiovascular;  Laterality: N/A;   Family History  Problem Relation Age of Onset  . Emphysema Maternal Grandmother     smoked  . Allergies  Daughter   . Heart disease Maternal Grandmother 73    MI  . Rheum arthritis Mother    Social History  Substance Use Topics  . Smoking status: Never Smoker   . Smokeless tobacco: Never Used  . Alcohol Use: No   OB History    Gravida Para Term Preterm AB TAB SAB Ectopic Multiple Living   2 1   1 1    1      Review of Systems All other systems negative except as documented in the HPI. All pertinent positives and negatives as reviewed in the HPI.   Allergies  Ivp dye; Vancomycin; Aspirin; Ciprofloxacin; and Sulfa antibiotics  Home Medications   Prior to Admission medications   Medication Sig Start Date End Date Taking? Authorizing Provider  albuterol (PROVENTIL HFA;VENTOLIN HFA) 108 (90 BASE) MCG/ACT inhaler Inhale 2 puffs into the lungs every 6 (six) hours as needed for wheezing or shortness of breath. 10/15/14  Yes Rowe Clack, MD  aspirin 81 MG tablet Take 81 mg by mouth daily.   Yes Historical Provider, MD  carvedilol (COREG) 25 MG tablet Take 1 tablet (25 mg total) by mouth 2 (two) times daily with a meal. 03/11/15  Yes Lelon Perla, MD  colesevelam (WELCHOL) 625 MG tablet Take 1 tablet (625 mg total) by mouth 3 (three) times daily with meals. 12/25/14  Yes  Rowe Clack, MD  cyclobenzaprine (FLEXERIL) 10 MG tablet Take 1 tablet (10 mg total) by mouth 3 (three) times daily as needed for muscle spasms. 01/03/15  Yes Lyndal Pulley, DO  diazepam (VALIUM) 10 MG tablet Take 10 mg by mouth at bedtime as needed for anxiety.   Yes Historical Provider, MD  fluticasone (FLONASE) 50 MCG/ACT nasal spray Place 1 spray into both nostrils at bedtime. Before donning CPAP 05/23/13  Yes Rowe Clack, MD  furosemide (LASIX) 40 MG tablet Take 2 tablets (80 mg total) by mouth 2 (two) times daily. 01/11/14  Yes Lelon Perla, MD  gabapentin (NEURONTIN) 100 MG capsule Take 100 mg by mouth daily as needed. For pain   Yes Historical Provider, MD  Liraglutide (VICTOZA) 18 MG/3ML SOPN  Inject 0.3 mLs (1.8 mg total) into the skin daily. 02/15/15  Yes Golden Circle, FNP  losartan (COZAAR) 100 MG tablet Take 1 tablet (100 mg total) by mouth daily. 09/10/14  Yes Lelon Perla, MD  potassium chloride SA (K-DUR,KLOR-CON) 20 MEQ tablet Take 2 tablets (40 mEq total) by mouth 2 (two) times daily. 02/17/13  Yes Lelon Perla, MD  spironolactone (ALDACTONE) 25 MG tablet Take 1 tablet (25 mg total) by mouth daily. 10/20/13  Yes Lelon Perla, MD  sucralfate (CARAFATE) 1 G tablet Take 1 g by mouth daily.   Yes Historical Provider, MD  venlafaxine XR (EFFEXOR XR) 75 MG 24 hr capsule Take 1 capsule (75 mg total) by mouth daily with breakfast. 04/24/15  Yes Lyndal Pulley, DO  Vitamin D, Ergocalciferol, (DRISDOL) 50000 UNITS CAPS capsule Take 1 capsule (50,000 Units total) by mouth every 7 (seven) days. Patient taking differently: Take 50,000 Units by mouth every 7 (seven) days. Take on Wednesday 01/03/15  Yes Lyndal Pulley, DO  zaleplon (SONATA) 10 MG capsule Take 20 mg by mouth at bedtime. 03/01/15  Yes Historical Provider, MD  Insulin Pen Needle (PEN NEEDLES) 32G X 4 MM MISC Inject 1 pen into the skin daily as needed (to administer insulin.). 01/08/15   Rowe Clack, MD   BP 106/62 mmHg  Pulse 73  Temp(Src) 97.9 F (36.6 C) (Oral)  Resp 20  SpO2 100%  LMP 04/01/2015 (Approximate) Physical Exam  Constitutional: She is oriented to person, place, and time. She appears well-developed and well-nourished. No distress.  HENT:  Head: Normocephalic and atraumatic.  Mouth/Throat: Oropharynx is clear and moist.  Eyes: Pupils are equal, round, and reactive to light.  Neck: Normal range of motion. Neck supple.  Cardiovascular: Normal rate, regular rhythm and normal heart sounds.  Exam reveals no gallop and no friction rub.   No murmur heard. Pulmonary/Chest: Effort normal and breath sounds normal. No respiratory distress. She has no wheezes.  Abdominal: Soft. Bowel sounds are  normal. She exhibits no distension. There is no tenderness.  Musculoskeletal: She exhibits no edema.  Neurological: She is alert and oriented to person, place, and time. She exhibits normal muscle tone. Coordination normal.  Skin: Skin is warm and dry. No rash noted. No erythema.  Psychiatric: She has a normal mood and affect. Her behavior is normal.  Nursing note and vitals reviewed.   ED Course  Procedures (including critical care time) Labs Review Labs Reviewed  BASIC METABOLIC PANEL - Abnormal; Notable for the following:    Chloride 100 (*)    All other components within normal limits  CBC  BRAIN NATRIURETIC PEPTIDE  I-STAT TROPOININ, ED  I-STAT TROPOININ,  ED    Imaging Review Dg Chest 2 View  04/25/2015  CLINICAL DATA:  Chest pain and dyspnea for 2 days EXAM: CHEST  2 VIEW COMPARISON:  10/28/2014 FINDINGS: The heart size and mediastinal contours are within normal limits. Both lungs are clear. The visualized skeletal structures are unremarkable. IMPRESSION: No active cardiopulmonary disease. Electronically Signed   By: Andreas Newport M.D.   On: 04/25/2015 22:08   I have personally reviewed and evaluated these images and lab results as part of my medical decision-making.   EKG Interpretation   Date/Time:  Thursday April 25 2015 21:35:31 EST Ventricular Rate:  88 PR Interval:  199 QRS Duration: 88 QT Interval:  381 QTC Calculation: 461 R Axis:   24 Text Interpretation:  Sinus rhythm Confirmed by COOK  MD, BRIAN ([id number]) on  04/25/2015 9:56:13 PM      MDM   Final diagnoses:  None   Patient does not have any signs of congestive heart failure on examination or testing.  Patient will be discharged home to follow up with her primary care doctor.  Advised the patient to rest as much possible.  Patient agrees the plan and all questions were answered.    Dalia Heading, PA-C 04/26/15 0109  Everlene Balls, MD 05/10/15 [phone]

## 2015-04-27 ENCOUNTER — Other Ambulatory Visit: Payer: Self-pay | Admitting: Internal Medicine

## 2015-04-27 ENCOUNTER — Other Ambulatory Visit: Payer: Self-pay | Admitting: Family Medicine

## 2015-04-29 ENCOUNTER — Other Ambulatory Visit: Payer: Self-pay

## 2015-04-29 MED ORDER — ALBUTEROL SULFATE HFA 108 (90 BASE) MCG/ACT IN AERS: 2.0000 | INHALATION_SPRAY | Freq: Four times a day (QID) | RESPIRATORY_TRACT | Status: DC | PRN

## 2015-05-01 MED ORDER — VITAMIN D (ERGOCALCIFEROL) 1.25 MG (50000 UNIT) PO CAPS: 50000.0000 [IU] | ORAL_CAPSULE | ORAL | Status: DC

## 2015-05-01 MED ORDER — CYCLOBENZAPRINE HCL 10 MG PO TABS: 10.0000 mg | ORAL_TABLET | Freq: Three times a day (TID) | ORAL | Status: DC | PRN

## 2015-05-01 NOTE — Addendum Note (Signed)
Addended by: Douglass Rivers T on: 05/01/2015 02:00 PM   Modules accepted: Orders

## 2015-05-01 NOTE — Telephone Encounter (Signed)
Refill done.  

## 2015-06-26 ENCOUNTER — Other Ambulatory Visit: Payer: Self-pay | Admitting: Internal Medicine

## 2015-06-26 DIAGNOSIS — Z1231 Encounter for screening mammogram for malignant neoplasm of breast: Secondary | ICD-10-CM

## 2015-07-01 ENCOUNTER — Other Ambulatory Visit: Payer: Self-pay | Admitting: Family Medicine

## 2015-07-01 NOTE — Telephone Encounter (Signed)
Per dr Tamala Julian, pt does not need this med refilled at this time.

## 2015-07-10 ENCOUNTER — Ambulatory Visit (INDEPENDENT_AMBULATORY_CARE_PROVIDER_SITE_OTHER)
Admission: RE | Admit: 2015-07-10 | Discharge: 2015-07-10 | Disposition: A | Discharge status: Home / Self Care | Payer: 59 | Source: Ambulatory Visit | Attending: Family Medicine | Admitting: Family Medicine

## 2015-07-10 ENCOUNTER — Encounter: Payer: Self-pay | Admitting: Family Medicine

## 2015-07-10 ENCOUNTER — Ambulatory Visit (INDEPENDENT_AMBULATORY_CARE_PROVIDER_SITE_OTHER): Payer: 59 | Admitting: Family Medicine

## 2015-07-10 VITALS — BP 132/80 | HR 101 | Wt 241.0 lb

## 2015-07-10 DIAGNOSIS — M9902 Segmental and somatic dysfunction of thoracic region: Secondary | ICD-10-CM

## 2015-07-10 DIAGNOSIS — M940 Chondrocostal junction syndrome [Tietze]: Secondary | ICD-10-CM | POA: Diagnosis not present

## 2015-07-10 DIAGNOSIS — S43431D Superior glenoid labrum lesion of right shoulder, subsequent encounter: Secondary | ICD-10-CM

## 2015-07-10 DIAGNOSIS — M25511 Pain in right shoulder: Secondary | ICD-10-CM

## 2015-07-10 DIAGNOSIS — M999 Biomechanical lesion, unspecified: Secondary | ICD-10-CM

## 2015-07-10 DIAGNOSIS — M9908 Segmental and somatic dysfunction of rib cage: Secondary | ICD-10-CM | POA: Diagnosis not present

## 2015-07-10 DIAGNOSIS — M9901 Segmental and somatic dysfunction of cervical region: Secondary | ICD-10-CM | POA: Diagnosis not present

## 2015-07-10 MED ORDER — MELOXICAM 15 MG PO TABS: 15.0000 mg | ORAL_TABLET | Freq: Every day | ORAL | Status: DC

## 2015-07-10 NOTE — Patient Instructions (Signed)
Good to see you  Ice 20 minutes 2 times daily. Usually after activity and before bed. Meloxicam daily for 7 days.  Keep doing the exercises Lets get xray today  See me again in 3 weeks.

## 2015-07-10 NOTE — Assessment & Plan Note (Signed)
We discussed possible repeat injection which patient declined. X-rays ordered today and were reviewed by me. 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 This is likely contributing to some of the discomfort. Patient does have a family history of rheumatological diseases 80 to consider further workup. Continued have pain as well I would either consider injection versus possible advance imaging for further evaluation.

## 2015-07-10 NOTE — Assessment & Plan Note (Signed)
Decision today to treat with OMT was based on Physical Exam  After verbal consent patient was treated with HVLA, ME, FPR techniques in cervical, thoracic and rib areas  Patient tolerated the procedure well with improvement in symptoms  Patient given exercises, stretches and lifestyle modifications  See medications in patient instructions if given  Patient will follow up in 3-4 weeks      

## 2015-07-10 NOTE — Progress Notes (Signed)
[name] [address], [place] [address] Phone: [phone] Subjective:     CC: Right shoulder pain, neck pain follow up  [name] is a 40 y.o. female coming in with complaint of right shoulder neck pain Patient is having aggravating symptoms that is keeping her up at night.  There is concerned the patient does have a labral tear in her shoulder. [name] last injection was 3 months ago. Patient was doing significantly better at last follow-up. Patient states overall she does think she is doing better. Continues to have discomfort though if she tries to reach across her body significantly. Does have some pain at night. States that it she can do daily activities but continues to have overall some difficulty. Patient states that the neck pain seems to be doing relatively well as well. Has responded well to osteopathic manipulation. Still some mild discomfort but nothing that is severe.     patient also has some chronic neck pain.  Patient has been responding very well to osteopathic manipulation therapy. X-rays that have been independently visualized by me showing mild arthritic changes.   Past Medical History  Diagnosis Date  . Hypertension   . Ovarian cyst     1999; surgically removed  . OSA (obstructive sleep apnea)     on CPAP   . IBS (irritable bowel syndrome)   . 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 Depression   . Anxiety   . Fibroid     age 19  . Termination of pregnancy     due to cardiac risk  . DVT (deep venous thrombosis) ([place])     2014  . CHF (congestive heart failure) ([place])   . Gallstones    Past Surgical History  Procedure Laterality Date  . Abdominal surgery      several  . Cesarean section    . Cholecystectomy    . Foot surgery Right   . Tubal ligation  1999  . Left heart catheterization with coronary angiogram N/A 02/26/2014    Procedure: LEFT HEART CATHETERIZATION  WITH CORONARY ANGIOGRAM;  Surgeon: Jettie Booze, MD;  Location: [facility] CATH LAB;  Service: Cardiovascular;  Laterality: N/A;   Social History  Substance Use Topics  . Smoking status: Never Smoker   . Smokeless tobacco: Never Used  . Alcohol Use: No   Allergies  Allergen Reactions  . Ivp Dye [Iodinated Diagnostic Agents] Other (See Comments)    Did something to my liver and kidneys told not to use it  . Vancomycin Other (See Comments)    "did something to my kidneys"; went into kidney failure  . Aspirin Other (See Comments)    Wheezing; but patient still takes if she needs to  . Ciprofloxacin Itching and Rash  . Sulfa Antibiotics Itching and Rash   Family History  Problem Relation Age of Onset  . Emphysema Maternal Grandmother     smoked  . Allergies Daughter   . Heart disease Maternal Grandmother 15    MI  . Rheum arthritis Mother    workup for rheumatoid arthritis has not been done that patient has had elevated ESR previously.  Past medical history, social, surgical and family history all reviewed in electronic medical record.   Review of Systems: No headache, visual changes, nausea, vomiting, diarrhea, constipation, dizziness, abdominal pain, skin rash, fevers, chills, night sweats, weight loss, swollen lymph nodes, body aches, joint swelling, muscle aches, chest  pain, shortness of breath, mood changes.   Objective Blood pressure 132/80, pulse 101, weight 241 lb (109.317 kg), last menstrual period 07/01/2015, SpO2 97 %.  General: No apparent distress alert and oriented x3 mood and affect normal, dressed appropriately.  HEENT: Pupils equal, extraocular movements intact  Respiratory: [name] speak in full sentences and does not appear short of breath  Cardiovascular: No lower extremity edema, non tender, no erythema  Skin: Warm dry intact with no signs of infection or rash on extremities or on axial skeleton.  Abdomen: Soft nontender  Neuro: Cranial nerves II through XII  are intact, neurovascularly intact in all extremities with 2+ DTRs and 2+ pulses.  Lymph: No lymphadenopathy of posterior or anterior cervical chain or axillae bilaterally.  Gait normal with good balance and coordination.  MSK:  Non tender with full range of motion and good stability and symmetric strength and tone of  elbows, wrist, hip, and knee,  ankles bilaterally.   Shoulder: Right Inspection reveals no abnormalities, atrophy or asymmetry. Palpation is normal with no tenderness over AC joint or bicipital groove. ROM is full in all planes passively. Rotator cuff strength normal throughout. Negative impingement signs Speeds and Yergason's tests normal. Positive labral pathology still present Normal scapular function observed. No painful arc and no drop arm sign. No apprehension sign Contralateral shoulder unremarkable  no change from previous exam  Osteopathic findings Cervical C2 flexed rotated and side bent right C4 flexed rotated and side bent left C6 flexed rotated and side bent right  Thoracic T1 extended rotated and side bent right with elevated first rib T3 extended rotated and side bent left       Impression and Recommendations:     This case required medical decision making of moderate complexity.

## 2015-07-10 NOTE — Assessment & Plan Note (Signed)
Overall seems to be doing alright. Patient continues to have difficulty doing her regular exercises. We discussed icing regimen. Discussed home exercises. Discussed which activities to avoid. Discussed postural control and ergonomics. Patient will come back and see me again in 3-4 weeks for further evaluation and treatment.

## 2015-07-12 ENCOUNTER — Other Ambulatory Visit: Payer: Self-pay | Admitting: *Deleted

## 2015-07-12 MED ORDER — VENLAFAXINE HCL ER 75 MG PO CP24: 75.0000 mg | ORAL_CAPSULE | Freq: Every day | ORAL | Status: DC

## 2015-07-12 NOTE — Telephone Encounter (Signed)
Refill done.  

## 2015-09-10 ENCOUNTER — Ambulatory Visit (INDEPENDENT_AMBULATORY_CARE_PROVIDER_SITE_OTHER): Payer: 59 | Admitting: Cardiology

## 2015-09-10 ENCOUNTER — Encounter: Payer: Self-pay | Admitting: Cardiology

## 2015-09-10 VITALS — BP 120/70 | HR 58 | Ht 65.0 in | Wt 241.0 lb

## 2015-09-10 DIAGNOSIS — R0600 Dyspnea, unspecified: Secondary | ICD-10-CM

## 2015-09-10 DIAGNOSIS — I5032 Chronic diastolic (congestive) heart failure: Secondary | ICD-10-CM

## 2015-09-10 DIAGNOSIS — I10 Essential (primary) hypertension: Secondary | ICD-10-CM

## 2015-09-10 DIAGNOSIS — R0602 Shortness of breath: Secondary | ICD-10-CM | POA: Diagnosis not present

## 2015-09-10 NOTE — Patient Instructions (Signed)
Medication Instructions:  Your physician recommends that you continue on your current medications as directed. Please refer to the Current Medication list given to you today.  Labwork: None ordered  Testing/Procedures: None ordered  Follow-Up: Your physician recommends that you schedule a follow-up appointment in: 2-3 [place]   Any Other Special Instructions Will Be Listed Below (If Applicable). Your physician recommends that you weigh, daily, at the same time every day, and in the same amount of clothing. Please record your daily weights on the handout provided and bring it to your next appointment. If your weight gets to 235 or more, take an extra Lasix 40 mg tablet that morning.   DO NOT TAKE NSAIDS MEDICATION.  THIS CAN CAUSE FLUID RETENTION.  USE TYLENOL AS NEEDED FOR HEADACHES  Low-Sodium Eating Plan Sodium raises blood pressure and causes water to be held in the body. Getting less sodium from food will help lower your blood pressure, reduce any swelling, and protect your heart, liver, and kidneys. We get sodium by adding salt (sodium chloride) to food. Most of our sodium comes from canned, boxed, and frozen foods. Restaurant foods, fast foods, and pizza are also very high in sodium. Even if you take medicine to lower your blood pressure or to reduce fluid in your body, getting less sodium from your food is important. WHAT IS MY PLAN? Most people should limit their sodium intake to 2,300 mg a day. Your health care provider recommends that you limit your sodium intake to 2 GRAMS a day.  WHAT DO I NEED TO KNOW ABOUT THIS EATING PLAN? For the low-sodium eating plan, you will follow these general guidelines:  Choose foods with a % Daily Value for sodium of less than 5% (as listed on the food label).   Use salt-free seasonings or herbs instead of table salt or sea salt.   Check with your health care provider or pharmacist before using salt substitutes.   Eat fresh  foods.  Eat more vegetables and fruits.  Limit canned vegetables. If you do use them, rinse them well to decrease the sodium.   Limit cheese to 1 oz (28 g) per day.   Eat lower-sodium products, often labeled as "lower sodium" or "no salt added."  Avoid foods that contain monosodium glutamate (MSG). MSG is sometimes added to [place] food and some canned foods.  Check food labels (Nutrition Facts labels) on foods to learn how much sodium is in one serving.  Eat more home-cooked food and less restaurant, buffet, and fast food.  When eating at a restaurant, ask that your food be prepared with less salt, or no salt if possible.  HOW DO I READ FOOD LABELS FOR SODIUM INFORMATION? The Nutrition Facts label lists the amount of sodium in one serving of the food. If you eat more than one serving, you must multiply the listed amount of sodium by the number of servings. Food labels may also identify foods as:  Sodium free--Less than 5 mg in a serving.  Very low sodium--35 mg or less in a serving.  Low sodium--140 mg or less in a serving.  Light in sodium--50% less sodium in a serving. For example, if a food that usually has 300 mg of sodium is changed to become light in sodium, it will have 150 mg of sodium.  Reduced sodium--25% less sodium in a serving. For example, if a food that usually has 400 mg of sodium is changed to reduced sodium, it will have 300  mg of sodium. WHAT FOODS CAN I EAT? Grains Low-sodium cereals, including oats, puffed wheat and rice, and shredded wheat cereals. Low-sodium crackers. Unsalted rice and pasta. Lower-sodium bread.  Vegetables Frozen or fresh vegetables. Low-sodium or reduced-sodium canned vegetables. Low-sodium or reduced-sodium tomato sauce and paste. Low-sodium or reduced-sodium tomato and vegetable juices.  Fruits Fresh, frozen, and canned fruit. Fruit juice.  Meat and Other Protein Products Low-sodium canned tuna and salmon. Fresh or frozen  meat, poultry, seafood, and fish. Lamb. Unsalted nuts. Dried beans, peas, and lentils without added salt. Unsalted canned beans. Homemade soups without salt. Eggs.  Dairy Milk. Soy milk. Ricotta cheese. Low-sodium or reduced-sodium cheeses. Yogurt.  Condiments Fresh and dried herbs and spices. Salt-free seasonings. Onion and garlic powders. Low-sodium varieties of mustard and ketchup. Fresh or refrigerated horseradish. Lemon juice.  Fats and Oils Reduced-sodium salad dressings. Unsalted butter.  Other Unsalted popcorn and pretzels.  The items listed above may not be a complete list of recommended foods or beverages. Contact your dietitian for more options. WHAT FOODS ARE NOT RECOMMENDED? Grains Instant hot cereals. Bread stuffing, pancake, and biscuit mixes. Croutons. Seasoned rice or pasta mixes. Noodle soup cups. Boxed or frozen macaroni and cheese. Self-rising flour. Regular salted crackers. Vegetables Regular canned vegetables. Regular canned tomato sauce and paste. Regular tomato and vegetable juices. Frozen vegetables in sauces. Salted [place] fries. Olives. Angie Fava. Relishes. Sauerkraut. Salsa. Meat and Other Protein Products Salted, canned, smoked, spiced, or pickled meats, seafood, or fish. Bacon, ham, sausage, hot dogs, corned beef, chipped beef, and packaged luncheon meats. Salt pork. Jerky. Pickled herring. Anchovies, regular canned tuna, and sardines. Salted nuts. Dairy Processed cheese and cheese spreads. Cheese curds. Blue cheese and cottage cheese. Buttermilk.  Condiments Onion and garlic salt, seasoned salt, table salt, and sea salt. Canned and packaged gravies. Worcestershire sauce. Tartar sauce. Barbecue sauce. Teriyaki sauce. Soy sauce, including reduced sodium. Steak sauce. Fish sauce. Oyster sauce. Cocktail sauce. Horseradish that you find on the shelf. Regular ketchup and mustard. Meat flavorings and tenderizers. Bouillon cubes. Hot sauce. Tabasco sauce.  Marinades. Taco seasonings. Relishes. Fats and Oils Regular salad dressings. Salted butter. Margarine. Ghee. Bacon fat.  Other Potato and tortilla chips. Corn chips and puffs. Salted popcorn and pretzels. Canned or dried soups. Pizza. Frozen entrees and pot pies.  The items listed above may not be a complete list of foods and beverages to avoid. Contact your dietitian for more information.   This information is not intended to replace advice given to you by your health care provider. Make sure you discuss any questions you have with your health care provider.   Document Released: 10/31/2001 Document Revised: 06/01/2014 Document Reviewed: 03/15/2013 Elsevier Interactive Patient Education [employer].     If you need a refill on your cardiac medications before your next appointment, please call your pharmacy.

## 2015-09-10 NOTE — Progress Notes (Signed)
09/10/2015 [name]   [birth year]  [id number]  Primary Physician No primary care provider on file. Primary Cardiologist: Dr. Stanford Breed   Reason for Visit/CC: F/u for Chronic Systolic HF  HPI:  The patient is a 40 year old AA female, followed by Dr. Stanford Breed and Dr. Tamala Julian, who presents to clinic today for follow-up.  Patient was diagnosed with a peripartum cardiomyopathy years ago. Last echocardiogram September 2015 showed ejection fraction [id number], grade 2 diastolic dysfunction, mild left atrial enlargement and mild tricuspid regurgitation. VQ scan September 2015 low probability. Nuclear study September 2015 showed anterior ischemia and ejection fraction 39%. Cardiac catheterization October 2015 showed normal coronary arteries and left ventricular end-diastolic pressure 16.   She was last seen by Dr. Stanford Breed 03/11/2015. At that time she complained of dyspnea with moderate exertion but denied any resting dyspnea. She was felt to be euvolemic on examination. Her blood pressure was mildly elevated thus Dr. Stanford Breed elected to increase her carvedilol to 25 mg twice a day. He continued her on her ARB as well as her regular dose of Lasix and spironolactone. A basic metabolic panel was obtained that visit which demonstrated normal renal function and potassium levels. She was instructed to follow-up in 6 months.  She complains of fluctuating daily weights at home associated with increased dyspnea and fluid retention. She does admit to some dietary indiscretion with sodium. She loves to eat [place] food. However she has made significant attempts to reduce her sodium intake. It also appears that she uses NSAIDs frequently, predominantly ibuprofen for headaches. Her dry weight at home is usually 232 lb.   In clinic she is currently asymptomatic. She denies any exertional dyspnea. She has no lower extremity edema on exam. She reports full daily compliance with Lasix.   Current Outpatient Prescriptions    Medication Sig Dispense Refill  . albuterol (PROVENTIL HFA;VENTOLIN HFA) 108 (90 BASE) MCG/ACT inhaler Inhale 2 puffs into the lungs every 6 (six) hours as needed for wheezing or shortness of breath. 1 Inhaler 0  . carvedilol (COREG) 25 MG tablet Take 1 tablet (25 mg total) by mouth 2 (two) times daily with a meal. 180 tablet 3  . colesevelam (WELCHOL) 625 MG tablet Take 1 tablet (625 mg total) by mouth 3 (three) times daily with meals. 270 tablet 1  . cyclobenzaprine (FLEXERIL) 10 MG tablet Take 1 tablet (10 mg total) by mouth 3 (three) times daily as needed for muscle spasms. 30 tablet 0  . diazepam (VALIUM) 10 MG tablet Take 10 mg by mouth at bedtime as needed for anxiety.    . fluticasone (FLONASE) 50 MCG/ACT nasal spray Place 1 spray into both nostrils at bedtime. Before donning CPAP 16 g 2  . furosemide (LASIX) 40 MG tablet Take 2 tablets (80 mg total) by mouth 2 (two) times daily. 120 tablet 12  . gabapentin (NEURONTIN) 100 MG capsule Take 100 mg by mouth daily as needed. For pain    . Insulin Pen Needle (PEN NEEDLES) 32G X 4 MM MISC Inject 1 pen into the skin daily as needed (to administer insulin.). 100 each 3  . Liraglutide (VICTOZA) 18 MG/3ML SOPN Inject 0.3 mLs (1.8 mg total) into the skin daily. 27 mL 1  . losartan (COZAAR) 100 MG tablet Take 1 tablet (100 mg total) by mouth daily. 90 tablet 3  . meloxicam (MOBIC) 15 MG tablet Take 1 tablet (15 mg total) by mouth daily. 30 tablet 0  . potassium chloride SA (K-DUR,KLOR-CON) 20 MEQ tablet Take  2 tablets (40 mEq total) by mouth 2 (two) times daily. 124 tablet 12  . promethazine (PHENERGAN) 25 MG tablet Take 25 mg by mouth every 6 (six) hours as needed for nausea or vomiting.    Marland Kitchen spironolactone (ALDACTONE) 25 MG tablet Take 1 tablet (25 mg total) by mouth daily. 90 tablet 3  . sucralfate (CARAFATE) 1 G tablet Take 1 g by mouth daily.    Marland Kitchen venlafaxine XR (EFFEXOR-XR) 75 MG 24 hr capsule Take 225 mg by mouth daily with breakfast.    .  Vitamin D, Ergocalciferol, (DRISDOL) 50000 UNITS CAPS capsule Take 1 capsule (50,000 Units total) by mouth every 7 (seven) days. 8 capsule 0  . zaleplon (SONATA) 10 MG capsule Take 20 mg by mouth at bedtime.  3   No current facility-administered medications for this visit.    Allergies  Allergen Reactions  . Ivp Dye [Iodinated Diagnostic Agents] Other (See Comments)    Did something to my liver and kidneys told not to use it  . Vancomycin Other (See Comments)    "did something to my kidneys"; went into kidney failure  . Aspirin Other (See Comments)    Wheezing; but patient still takes if she needs to  . Ciprofloxacin Itching and Rash  . Sulfa Antibiotics Itching and Rash    Social History   Social History  . Marital Status: Married    Spouse Name: N/A  . Number of Children: 1  . Years of Education: N/A   Occupational History  . stay at home mom    Social History Main Topics  . Smoking status: Never Smoker   . Smokeless tobacco: Never Used  . Alcohol Use: No  . Drug Use: No  . Sexual Activity: Not on file   Other Topics Concern  . Not on file   Social History Narrative     Review of Systems: General: negative for chills, fever, night sweats or weight changes.  Cardiovascular: negative for chest pain, dyspnea on exertion, edema, orthopnea, palpitations, paroxysmal nocturnal dyspnea or shortness of breath Dermatological: negative for rash Respiratory: negative for cough or wheezing Urologic: negative for hematuria Abdominal: negative for nausea, vomiting, diarrhea, bright red blood per rectum, melena, or hematemesis Neurologic: negative for visual changes, syncope, or dizziness All other systems reviewed and are otherwise negative except as noted above.    Blood pressure 120/70, pulse 58, height 5\' 5"  (1.651 m), weight 241 lb (109.317 kg), last menstrual period 08/24/2015.  General appearance: alert, cooperative and no distress Neck: no carotid bruit and no  JVD Lungs: clear to auscultation bilaterally Heart: regular rate and rhythm, S1, S2 normal, no murmur, click, rub or gallop Extremities: no LEE Pulses: 2+ and symmetric Skin: warm and dry Neurologic: Alert and oriented X 3, normal strength and tone. Normal symmetric reflexes. Normal coordination and gait  EKG not performed  ASSESSMENT AND PLAN:   1. Chronic Combined Systolic + Diastolic HF: Patient is currently asymptomatic today without dyspnea. She is euvolemic on physical exam. However she does report issues over the past several weeks with fluctuating daily weights, increasing dyspnea and fluid retention. She has been fully compliant with Lasix. She takes 80 mg twice a day. She is also on spironolactone. We discussed lifestyle habits and it appears that she occasionally slips and eats a high sodium diet however she recognizes that she does need to cut back on this to reduce acute exacerbations. It also appears that she has been frequently using NSAIDs, primarily ibuprofen  for headaches. I explained to her how this can increase fluid retention. I recommended that she take Tylenol when necessary for headaches. She will also work on maintaining a low-sodium diet. We discussed continuing daily weights. Her dry weight at home is 232 pounds. She has been instructed to use sliding-scale Lasix adjustments. She will take an extra 40 mg of Lasix in the morning to equal 120 mg if her weight exceeds 3 pounds above her dry weight. If she does this, she will need to take an additional 20 mEq of potassium. Continue BB and ARB therapy along with spironolactone.   PLAN  Patient plans to reestablish care with Dr. Tamala Julian, as he was initially her primary cardiologist. F/u in 3 months.   Lyda Jester PA-C 09/10/2015 3:45 PM

## 2015-11-08 ENCOUNTER — Emergency Department (HOSPITAL_COMMUNITY): Payer: 59

## 2015-11-08 ENCOUNTER — Encounter (HOSPITAL_COMMUNITY): Payer: Self-pay | Admitting: Emergency Medicine

## 2015-11-08 ENCOUNTER — Other Ambulatory Visit: Payer: Self-pay | Admitting: Family Medicine

## 2015-11-08 ENCOUNTER — Other Ambulatory Visit: Payer: Self-pay | Admitting: Internal Medicine

## 2015-11-08 DIAGNOSIS — I110 Hypertensive heart disease with heart failure: Secondary | ICD-10-CM | POA: Diagnosis not present

## 2015-11-08 DIAGNOSIS — Z86718 Personal history of other venous thrombosis and embolism: Secondary | ICD-10-CM | POA: Insufficient documentation

## 2015-11-08 DIAGNOSIS — M79661 Pain in right lower leg: Secondary | ICD-10-CM | POA: Diagnosis not present

## 2015-11-08 DIAGNOSIS — F418 Other specified anxiety disorders: Secondary | ICD-10-CM | POA: Diagnosis not present

## 2015-11-08 DIAGNOSIS — R079 Chest pain, unspecified: Secondary | ICD-10-CM | POA: Diagnosis present

## 2015-11-08 DIAGNOSIS — R0789 Other chest pain: Secondary | ICD-10-CM | POA: Diagnosis not present

## 2015-11-08 DIAGNOSIS — I5042 Chronic combined systolic (congestive) and diastolic (congestive) heart failure: Secondary | ICD-10-CM | POA: Insufficient documentation

## 2015-11-08 DIAGNOSIS — Z7984 Long term (current) use of oral hypoglycemic drugs: Secondary | ICD-10-CM | POA: Insufficient documentation

## 2015-11-08 DIAGNOSIS — R000 Tachycardia, unspecified: Secondary | ICD-10-CM | POA: Diagnosis not present

## 2015-11-08 DIAGNOSIS — G4733 Obstructive sleep apnea (adult) (pediatric): Secondary | ICD-10-CM | POA: Insufficient documentation

## 2015-11-08 DIAGNOSIS — O903 Peripartum cardiomyopathy: Secondary | ICD-10-CM | POA: Diagnosis not present

## 2015-11-08 DIAGNOSIS — E119 Type 2 diabetes mellitus without complications: Secondary | ICD-10-CM | POA: Insufficient documentation

## 2015-11-08 DIAGNOSIS — J069 Acute upper respiratory infection, unspecified: Secondary | ICD-10-CM | POA: Insufficient documentation

## 2015-11-08 DIAGNOSIS — Z79899 Other long term (current) drug therapy: Secondary | ICD-10-CM | POA: Insufficient documentation

## 2015-11-08 DIAGNOSIS — I493 Ventricular premature depolarization: Secondary | ICD-10-CM | POA: Insufficient documentation

## 2015-11-08 DIAGNOSIS — E876 Hypokalemia: Secondary | ICD-10-CM | POA: Insufficient documentation

## 2015-11-08 DIAGNOSIS — I272 Other secondary pulmonary hypertension: Secondary | ICD-10-CM | POA: Insufficient documentation

## 2015-11-08 DIAGNOSIS — Z6841 Body Mass Index (BMI) 40.0 and over, adult: Secondary | ICD-10-CM | POA: Diagnosis not present

## 2015-11-08 LAB — CBC
HCT: 37.4 % (ref 36.0–46.0)
Hemoglobin: 11.9 g/dL — ABNORMAL LOW (ref 12.0–15.0)
MCH: 27.2 pg (ref 26.0–34.0)
MCHC: 31.8 g/dL (ref 30.0–36.0)
MCV: 85.6 fL (ref 78.0–100.0)
Platelets: 234 10*3/uL (ref 150–400)
RBC: 4.37 MIL/uL (ref 3.87–5.11)
RDW: 13.5 % (ref 11.5–15.5)
WBC: 7.2 10*3/uL (ref 4.0–10.5)

## 2015-11-08 LAB — BASIC METABOLIC PANEL
Anion gap: 5 (ref 5–15)
BUN: 9 mg/dL (ref 6–20)
CO2: 24 mmol/L (ref 22–32)
Calcium: 9.1 mg/dL (ref 8.9–10.3)
Chloride: 108 mmol/L (ref 101–111)
Creatinine, Ser: 0.84 mg/dL (ref 0.44–1.00)
GFR calc Af Amer: >60 mL/min (ref >60)
GFR calc non Af Amer: >60 mL/min (ref >60)
Glucose, Bld: 112 mg/dL — ABNORMAL HIGH (ref 65–99)
Potassium: 3.4 mmol/L — ABNORMAL LOW (ref 3.5–5.1)
Sodium: 137 mmol/L (ref 135–145)

## 2015-11-08 LAB — I-STAT TROPONIN, ED: Troponin i, poc: 0 ng/mL (ref 0.00–0.08)

## 2015-11-08 NOTE — ED Notes (Signed)
C/o L sided chest pressure and pain under L breast with sob, non-productive cough, and nausea since 3am.  History of CHF.

## 2015-11-09 ENCOUNTER — Observation Stay (HOSPITAL_COMMUNITY): Payer: 59

## 2015-11-09 ENCOUNTER — Encounter (HOSPITAL_COMMUNITY): Payer: Self-pay | Admitting: Internal Medicine

## 2015-11-09 ENCOUNTER — Observation Stay (HOSPITAL_COMMUNITY)
Admission: EM | Admit: 2015-11-09 | Discharge: 2015-11-12 | Disposition: A | Discharge status: Home / Self Care | Payer: 59 | Attending: Internal Medicine | Admitting: Internal Medicine

## 2015-11-09 DIAGNOSIS — I5042 Chronic combined systolic (congestive) and diastolic (congestive) heart failure: Secondary | ICD-10-CM | POA: Diagnosis not present

## 2015-11-09 DIAGNOSIS — F329 Major depressive disorder, single episode, unspecified: Secondary | ICD-10-CM | POA: Diagnosis present

## 2015-11-09 DIAGNOSIS — G4733 Obstructive sleep apnea (adult) (pediatric): Secondary | ICD-10-CM | POA: Diagnosis present

## 2015-11-09 DIAGNOSIS — I10 Essential (primary) hypertension: Secondary | ICD-10-CM

## 2015-11-09 DIAGNOSIS — R079 Chest pain, unspecified: Secondary | ICD-10-CM | POA: Diagnosis not present

## 2015-11-09 DIAGNOSIS — E119 Type 2 diabetes mellitus without complications: Secondary | ICD-10-CM

## 2015-11-09 DIAGNOSIS — E876 Hypokalemia: Secondary | ICD-10-CM

## 2015-11-09 DIAGNOSIS — I509 Heart failure, unspecified: Secondary | ICD-10-CM | POA: Diagnosis not present

## 2015-11-09 DIAGNOSIS — F418 Other specified anxiety disorders: Secondary | ICD-10-CM | POA: Diagnosis not present

## 2015-11-09 DIAGNOSIS — R0789 Other chest pain: Secondary | ICD-10-CM | POA: Diagnosis not present

## 2015-11-09 DIAGNOSIS — M79661 Pain in right lower leg: Secondary | ICD-10-CM

## 2015-11-09 DIAGNOSIS — R9439 Abnormal result of other cardiovascular function study: Secondary | ICD-10-CM | POA: Insufficient documentation

## 2015-11-09 DIAGNOSIS — R000 Tachycardia, unspecified: Secondary | ICD-10-CM

## 2015-11-09 DIAGNOSIS — R072 Precordial pain: Secondary | ICD-10-CM | POA: Diagnosis not present

## 2015-11-09 DIAGNOSIS — O903 Peripartum cardiomyopathy: Secondary | ICD-10-CM

## 2015-11-09 DIAGNOSIS — R0602 Shortness of breath: Secondary | ICD-10-CM

## 2015-11-09 DIAGNOSIS — I5023 Acute on chronic systolic (congestive) heart failure: Secondary | ICD-10-CM | POA: Diagnosis not present

## 2015-11-09 HISTORY — DX: Type 2 diabetes mellitus without complications: E11.9

## 2015-11-09 LAB — GLUCOSE, CAPILLARY
Glucose-Capillary: 99 mg/dL (ref 65–99)
Glucose-Capillary: 101 mg/dL — ABNORMAL HIGH (ref 65–99)

## 2015-11-09 LAB — LIPID PANEL
Cholesterol: 160 mg/dL (ref 0–200)
HDL: 58 mg/dL (ref >40)
LDL Cholesterol: 88 mg/dL (ref 0–99)
Total CHOL/HDL Ratio: 2.8 RATIO
Triglycerides: 70 mg/dL (ref <150)
VLDL: 14 mg/dL (ref 0–40)

## 2015-11-09 LAB — RAPID URINE DRUG SCREEN, HOSP PERFORMED
Amphetamines: NOT DETECTED
Barbiturates: NOT DETECTED
Benzodiazepines: POSITIVE — AB
Cocaine: NOT DETECTED
Opiates: NOT DETECTED
Tetrahydrocannabinol: NOT DETECTED

## 2015-11-09 LAB — TROPONIN I
Troponin I: <0.03 ng/mL (ref <0.031)
Troponin I: <0.03 ng/mL (ref <0.031)
Troponin I: <0.03 ng/mL (ref <0.031)

## 2015-11-09 LAB — HEPARIN LEVEL (UNFRACTIONATED)
Heparin Unfractionated: 0.59 IU/mL (ref 0.30–0.70)
Heparin Unfractionated: 0.56 IU/mL (ref 0.30–0.70)

## 2015-11-09 LAB — D-DIMER, QUANTITATIVE: D-Dimer, Quant: 1.79 ug/mL-FEU — ABNORMAL HIGH (ref 0.00–0.50)

## 2015-11-09 LAB — BRAIN NATRIURETIC PEPTIDE: B Natriuretic Peptide: 378 pg/mL — ABNORMAL HIGH (ref 0.0–100.0)

## 2015-11-09 LAB — APTT: aPTT: 31 seconds (ref 24–37)

## 2015-11-09 LAB — PROTIME-INR
INR: 0.99 (ref 0.00–1.49)
Prothrombin Time: 13.3 seconds (ref 11.6–15.2)

## 2015-11-09 MED ORDER — INSULIN ASPART 100 UNIT/ML ~~LOC~~ SOLN: 0.0000 [IU] | Freq: Three times a day (TID) | SUBCUTANEOUS | Status: DC

## 2015-11-09 MED ORDER — AZITHROMYCIN 250 MG PO TABS
500.0000 mg | ORAL_TABLET | Freq: Every day | ORAL | Status: DC
  Administered 2015-11-09 – 2015-11-12 (×4): 500 mg via ORAL
  Filled 2015-11-09 (×4): qty 2

## 2015-11-09 MED ORDER — LOSARTAN POTASSIUM 50 MG PO TABS
100.0000 mg | ORAL_TABLET | Freq: Every day | ORAL | Status: DC
  Administered 2015-11-10 – 2015-11-12 (×3): 100 mg via ORAL
  Filled 2015-11-09 (×3): qty 2

## 2015-11-09 MED ORDER — IOPAMIDOL (ISOVUE-370) INJECTION 76%
INTRAVENOUS | Status: AC
  Administered 2015-11-09: 75 mL
  Filled 2015-11-09: qty 100

## 2015-11-09 MED ORDER — ACETAMINOPHEN 325 MG PO TABS: 650.0000 mg | ORAL_TABLET | ORAL | Status: DC | PRN

## 2015-11-09 MED ORDER — ALBUTEROL SULFATE (2.5 MG/3ML) 0.083% IN NEBU
5.0000 mg | INHALATION_SOLUTION | Freq: Once | RESPIRATORY_TRACT | Status: AC
  Administered 2015-11-09: 5 mg via RESPIRATORY_TRACT
  Filled 2015-11-09: qty 6

## 2015-11-09 MED ORDER — HEPARIN (PORCINE) IN NACL 100-0.45 UNIT/ML-% IJ SOLN
1300.0000 [IU]/h | INTRAMUSCULAR | Status: DC
  Administered 2015-11-09 (×2): 1300 [IU]/h via INTRAVENOUS
  Filled 2015-11-09 (×2): qty 250

## 2015-11-09 MED ORDER — LOSARTAN POTASSIUM 50 MG PO TABS: 100.0000 mg | ORAL_TABLET | Freq: Every day | ORAL | Status: DC

## 2015-11-09 MED ORDER — FUROSEMIDE 80 MG PO TABS
80.0000 mg | ORAL_TABLET | Freq: Every day | ORAL | Status: DC
  Filled 2015-11-09: qty 1

## 2015-11-09 MED ORDER — COLESEVELAM HCL 625 MG PO TABS
625.0000 mg | ORAL_TABLET | Freq: Three times a day (TID) | ORAL | Status: DC
  Administered 2015-11-09 – 2015-11-12 (×5): 625 mg via ORAL
  Filled 2015-11-09 (×11): qty 1

## 2015-11-09 MED ORDER — INSULIN ASPART 100 UNIT/ML ~~LOC~~ SOLN
0.0000 [IU] | Freq: Every day | SUBCUTANEOUS | Status: DC
Start: 2015-11-09 — End: 2015-11-09

## 2015-11-09 MED ORDER — POTASSIUM CHLORIDE 20 MEQ/15ML (10%) PO SOLN
20.0000 meq | Freq: Once | ORAL | Status: AC
  Administered 2015-11-09: 20 meq via ORAL
  Filled 2015-11-09: qty 15

## 2015-11-09 MED ORDER — HEPARIN BOLUS VIA INFUSION
5000.0000 [IU] | Freq: Once | INTRAVENOUS | Status: AC
  Administered 2015-11-09: 5000 [IU] via INTRAVENOUS
  Filled 2015-11-09: qty 5000

## 2015-11-09 MED ORDER — OXYCODONE-ACETAMINOPHEN 5-325 MG PO TABS
2.0000 | ORAL_TABLET | Freq: Four times a day (QID) | ORAL | Status: DC | PRN
  Administered 2015-11-09 – 2015-11-10 (×2): 2 via ORAL
  Administered 2015-11-11: 1 via ORAL
  Filled 2015-11-09 (×3): qty 2

## 2015-11-09 MED ORDER — VENLAFAXINE HCL ER 75 MG PO CP24
225.0000 mg | ORAL_CAPSULE | Freq: Every day | ORAL | Status: DC
  Administered 2015-11-09 – 2015-11-10 (×2): 225 mg via ORAL
  Filled 2015-11-09 (×5): qty 1

## 2015-11-09 MED ORDER — ALBUTEROL SULFATE (2.5 MG/3ML) 0.083% IN NEBU
2.5000 mg | INHALATION_SOLUTION | RESPIRATORY_TRACT | Status: AC | PRN
Start: 2015-11-09 — End: 2015-11-09

## 2015-11-09 MED ORDER — ALPRAZOLAM 0.25 MG PO TABS: 0.2500 mg | ORAL_TABLET | Freq: Two times a day (BID) | ORAL | Status: DC | PRN

## 2015-11-09 MED ORDER — ALBUTEROL SULFATE (2.5 MG/3ML) 0.083% IN NEBU
2.5000 mg | INHALATION_SOLUTION | RESPIRATORY_TRACT | Status: DC | PRN
  Administered 2015-11-09 – 2015-11-11 (×3): 2.5 mg via RESPIRATORY_TRACT
  Filled 2015-11-09 (×5): qty 3

## 2015-11-09 MED ORDER — IPRATROPIUM BROMIDE 0.02 % IN SOLN
0.5000 mg | Freq: Once | RESPIRATORY_TRACT | Status: AC
  Administered 2015-11-09: 0.5 mg via RESPIRATORY_TRACT
  Filled 2015-11-09: qty 2.5

## 2015-11-09 MED ORDER — CARVEDILOL 25 MG PO TABS
25.0000 mg | ORAL_TABLET | Freq: Two times a day (BID) | ORAL | Status: DC
  Administered 2015-11-09 – 2015-11-12 (×6): 25 mg via ORAL
  Filled 2015-11-09: qty 2
  Filled 2015-11-09: qty 1
  Filled 2015-11-09 (×5): qty 2

## 2015-11-09 MED ORDER — MORPHINE SULFATE (PF) 2 MG/ML IV SOLN: 2.0000 mg | INTRAVENOUS | Status: DC | PRN

## 2015-11-09 MED ORDER — ONDANSETRON HCL 4 MG/2ML IJ SOLN
4.0000 mg | Freq: Four times a day (QID) | INTRAMUSCULAR | Status: DC | PRN
  Administered 2015-11-09 – 2015-11-10 (×2): 4 mg via INTRAVENOUS
  Filled 2015-11-09 (×2): qty 2

## 2015-11-09 MED ORDER — FUROSEMIDE 10 MG/ML IJ SOLN
40.0000 mg | Freq: Once | INTRAMUSCULAR | Status: AC
  Administered 2015-11-09: 40 mg via INTRAVENOUS
  Filled 2015-11-09: qty 4

## 2015-11-09 MED ORDER — SPIRONOLACTONE 25 MG PO TABS
25.0000 mg | ORAL_TABLET | Freq: Every day | ORAL | Status: DC
  Administered 2015-11-09 – 2015-11-10 (×2): 25 mg via ORAL
  Filled 2015-11-09 (×2): qty 1

## 2015-11-09 MED ORDER — NITROGLYCERIN 0.4 MG SL SUBL
0.4000 mg | SUBLINGUAL_TABLET | SUBLINGUAL | Status: DC | PRN
  Administered 2015-11-11: 0.4 mg via SUBLINGUAL
  Filled 2015-11-09: qty 1

## 2015-11-09 MED ORDER — FUROSEMIDE 20 MG PO TABS: 80.0000 mg | ORAL_TABLET | Freq: Two times a day (BID) | ORAL | Status: DC

## 2015-11-09 MED ORDER — PROMETHAZINE HCL 25 MG PO TABS
25.0000 mg | ORAL_TABLET | Freq: Four times a day (QID) | ORAL | Status: DC | PRN
  Administered 2015-11-09 – 2015-11-10 (×2): 25 mg via ORAL
  Filled 2015-11-09 (×2): qty 1

## 2015-11-09 MED ORDER — GABAPENTIN 100 MG PO CAPS: 100.0000 mg | ORAL_CAPSULE | Freq: Every day | ORAL | Status: DC | PRN

## 2015-11-09 NOTE — Progress Notes (Signed)
ANTICOAGULATION CONSULT NOTE - Initial Consult  Pharmacy Consult for heparin Indication: r/o VTE  Allergies  Allergen Reactions  . Ivp Dye [Iodinated Diagnostic Agents] Other (See Comments)    Did something to my liver and kidneys told not to use it  . Vancomycin Other (See Comments)    "did something to my kidneys"; went into kidney failure  . Aspirin Other (See Comments)    Wheezing; but patient still takes if she needs to  . Ciprofloxacin Itching and Rash  . Sulfa Antibiotics Itching and Rash    Patient Measurements: Height: 5\' 5"  (165.1 cm) Weight: 240 lb 15.4 oz (109.3 kg) IBW/kg (Calculated) : 57 Heparin Dosing Weight: 85kg  Vital Signs: Temp: 98.1 F (36.7 C) (06/16 2152) Temp Source: Oral (06/16 2152) BP: 117/83 mmHg (06/17 0430) Pulse Rate: 103 (06/17 0430)  Labs:  Recent Labs  11/08/15 2149  HGB 11.9*  HCT 37.4  PLT 234  CREATININE 0.84    Estimated Creatinine Clearance: 108.4 mL/min (by C-G formula based on Cr of 0.84).   Medical History: Past Medical History  Diagnosis Date  . Hypertension   . Ovarian cyst     1999; surgically removed  . OSA (obstructive sleep apnea)     on CPAP   . IBS (irritable bowel syndrome)   . 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 Depression   . Anxiety   . Fibroid     age 68  . Termination of pregnancy     due to cardiac risk  . DVT (deep venous thrombosis) ([place])     2014  . CHF (congestive heart failure) ([place])   . Gallstones     Assessment: 40yo female c/o CP/pressure w/ SOB, D-dimer elevated, awaiting VQ scan, concern for VTE, to begin heparin.  Goal of Therapy:  Heparin level 0.3-0.7 units/ml Monitor platelets by anticoagulation protocol: Yes   Plan:  Will give heparin 5000 units IV bolus x1 followed by gtt at 1300 units/hr and monitor heparin levels and CBC.  Wynona Neat, PharmD, BCPS  11/09/2015,5:07 AM

## 2015-11-09 NOTE — ED Provider Notes (Signed)
CSN: [id number]     Arrival date & time 11/08/15  2140 History   First MD Initiated Contact with Patient 11/09/15 0222     Chief Complaint  Patient presents with  . Chest Pain  . Shortness of Breath     (Consider location/radiation/quality/duration/timing/severity/associated sxs/prior Treatment) The history is provided by the patient and medical records. No language interpreter was used.     [name] is a 40 y.o. female  with a hx of SOB, cardiomyopathy, CHF, DVT presents to the Emergency Department complaining of gradual, persistent, progressively worsening SOB, cough, weakness onset 3am this morning.  She reports chest pain described as a heaviness with a pinch under the left breast.  Pt reports her weight was not increased this morning and she took her usual 80mg  of Lasix and another 40mg  this afternoon.  Pt reports she has also felt "bloated," common with her CHF flares. Associated symptoms include fatigue.  Nothing makes it better and nothing makes it worse.  Pt denies fever, chills, headache, neck pain, abd pain, V/D, weakness, syncope, dysuria.   She denies sick contacts.  She has used albuterol at home without relief.  Pt does report right calf tenderness on Monday.  No exogenous estrogen, immobilization.  Pt has taken all other home meds as directed.    Record review shows baseline EF at 30%.  Cardiology: varanasi   Past Medical History  Diagnosis Date  . Hypertension   . Ovarian cyst     1999; surgically removed  . OSA (obstructive sleep apnea)     on CPAP   . IBS (irritable bowel syndrome)   . 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 Depression   . Anxiety   . Fibroid     age 65  . Termination of pregnancy     due to cardiac risk  . DVT (deep venous thrombosis) ([place])     2014  . CHF (congestive heart failure) ([place])   . Gallstones   . Diabetes mellitus without complication [facility]    Past Surgical History  Procedure Laterality Date  .  Abdominal surgery      several  . Cesarean section    . Cholecystectomy    . Foot surgery Right   . Tubal ligation  1999  . Left heart catheterization with coronary angiogram N/A 02/26/2014    Procedure: LEFT HEART CATHETERIZATION WITH CORONARY ANGIOGRAM;  Surgeon: Jettie Booze, MD;  [facility] C[facility] LAB;  Service: Cardiovascular;  Laterality: N/A;   Family History  Problem Relation Age of Onset  . Emphysema Maternal Grandmother     smoked  . Allergies Daughter   . Heart disease Maternal Grandmother 2    MI  . Rheum arthritis Mother    Social History  Substance Use Topics  . Smoking status: Never Smoker   . Smokeless tobacco: Never Used  . Alcohol Use: No   OB History    Gravida Para Term Preterm AB TAB SAB Ectopic Multiple Living   2 1   1 1    1      Review of Systems  Constitutional: Negative for fever, diaphoresis, appetite change, fatigue and unexpected weight change.  HENT: Negative for mouth sores.   Eyes: Negative for visual disturbance.  Respiratory: Positive for shortness of breath. Negative for cough, chest tightness and wheezing.   Cardiovascular: Positive for chest pain.  Gastrointestinal: Positive for nausea. Negative for vomiting, abdominal pain, diarrhea and constipation.  Endocrine:  Negative for polydipsia, polyphagia and polyuria.  Genitourinary: Negative for dysuria, urgency, frequency and hematuria.  Musculoskeletal: Negative for back pain and neck stiffness.  Skin: Negative for rash.  Allergic/Immunologic: Negative for immunocompromised state.  Neurological: Negative for syncope, light-headedness and headaches.  Hematological: Does not bruise/bleed easily.  Psychiatric/Behavioral: Negative for sleep disturbance. The patient is not nervous/anxious.       Allergies  Ivp dye; Vancomycin; Aspirin; Ciprofloxacin; and Sulfa antibiotics  Home Medications   Prior to Admission medications   Medication Sig Start Date End Date Taking? Authorizing  Provider  albuterol (PROVENTIL HFA;VENTOLIN HFA) 108 (90 BASE) MCG/ACT inhaler Inhale 2 puffs into the lungs every 6 (six) hours as needed for wheezing or shortness of breath. 04/29/15  Yes Janith Lima, MD  carvedilol (COREG) 25 MG tablet Take 1 tablet (25 mg total) by mouth 2 (two) times daily with a meal. 03/11/15  Yes Lelon Perla, MD  colesevelam (WELCHOL) 625 MG tablet Take 1 tablet (625 mg total) by mouth 3 (three) times daily with meals. 12/25/14  Yes Rowe Clack, MD  furosemide (LASIX) 40 MG tablet Take 2 tablets (80 mg total) by mouth 2 (two) times daily. 01/11/14  Yes Lelon Perla, MD  gabapentin (NEURONTIN) 100 MG capsule Take 100 mg by mouth daily as needed. For pain   Yes Historical Provider, MD  Liraglutide (VICTOZA) 18 MG/3ML SOPN Inject 0.3 mLs (1.8 mg total) into the skin daily. 02/15/15  Yes Golden Circle, FNP  losartan (COZAAR) 100 MG tablet Take 1 tablet (100 mg total) by mouth daily. 09/10/14  Yes Lelon Perla, MD  potassium chloride SA (K-DUR,KLOR-CON) 20 MEQ tablet Take 2 tablets (40 mEq total) by mouth 2 (two) times daily. 02/17/13  Yes Lelon Perla, MD  spironolactone (ALDACTONE) 25 MG tablet Take 1 tablet (25 mg total) by mouth daily. 10/20/13  Yes Lelon Perla, MD  venlafaxine XR (EFFEXOR-XR) 75 MG 24 hr capsule Take 225 mg by mouth daily with breakfast.   Yes Historical Provider, MD  cyclobenzaprine (FLEXERIL) 10 MG tablet Take 1 tablet (10 mg total) by mouth 3 (three) times daily as needed for muscle spasms. Patient not taking: Reported on 11/09/2015 05/01/15   Lyndal Pulley, DO  fluticasone [facility]) 50 MCG/ACT nasal spray Place 1 spray into both nostrils at bedtime. Before donning CPAP Patient not taking: Reported on 11/09/2015 05/23/13   Rowe Clack, MD  Insulin Pen Needle (PEN NEEDLES) 32G X 4 MM MISC Inject 1 pen into the skin daily as needed (to administer insulin.). 01/08/15   Rowe Clack, MD  meloxicam (MOBIC) 15 MG tablet Take  1 tablet (15 mg total) by mouth daily. Patient not taking: Reported on 11/09/2015 07/10/15   Lyndal Pulley, DO  Vitamin D, Ergocalciferol, (DRISDOL) 50000 UNITS CAPS capsule Take 1 capsule (50,000 Units total) by mouth every 7 (seven) days. Patient not taking: Reported on 11/09/2015 05/01/15   Lyndal Pulley, DO   BP 153/96 mmHg  Pulse 53  Temp(Src) 98.4 F (36.9 C) (Oral)  Resp 22  Ht 5\' 6"  (1.676 m)  Wt 110.406 kg  BMI 39.30 kg/m2  SpO2 99%  LMP 10/24/2015 Physical Exam  Constitutional: She appears well-developed and well-nourished. No distress.  Awake, alert, nontoxic appearance  HENT:  Head: Normocephalic and atraumatic.  Mouth/Throat: Oropharynx is clear and moist. No oropharyngeal exudate.  Eyes: Conjunctivae are normal. No scleral icterus.  Neck: Normal range of motion. Neck supple.  Cardiovascular:  Regular rhythm and intact distal pulses.  Tachycardia present.   Pulses:      Radial pulses are 2+ on the right side, and 2+ on the left side.  Pulmonary/Chest: Accessory muscle usage present. Tachypnea noted. She is in respiratory distress. She has decreased breath sounds. She has no wheezes. She has no rhonchi. She has no rales.  Equal chest expansion Cough  Abdominal: Soft. Bowel sounds are normal. She exhibits no mass. There is no tenderness. There is no rebound and no guarding.  Musculoskeletal: Normal range of motion. She exhibits edema ( 2+, nonpitting, bilateral).  Neurological: She is alert.  Speech is clear and goal oriented Moves extremities without ataxia  Skin: Skin is warm and dry. She is not diaphoretic.  Psychiatric: She has a normal mood and affect.  Nursing note and vitals reviewed.   ED Course  Procedures (including critical care time) Labs Review Labs Reviewed  BASIC METABOLIC PANEL - Abnormal; Notable for the following:    Potassium 3.4 (*)    Glucose, Bld 112 (*)    All other components within normal limits  CBC - Abnormal; Notable for the  following:    Hemoglobin 11.9 (*)    All other components within normal limits  BRAIN NATRIURETIC PEPTIDE - Abnormal; Notable for the following:    B Natriuretic Peptide 378.0 (*)    All other components within normal limits  D-DIMER, QUANTITATIVE (NOT AT [facility]) - Abnormal; Notable for the following:    D-Dimer, Quant 1.79 (*)    All other components within normal limits  TROPONIN I  LIPID PANEL  PROTIME-INR  APTT  HEPARIN LEVEL (UNFRACTIONATED)  TROPONIN I  TROPONIN I  HEMOGLOBIN A1C  URINE RAPID DRUG SCREEN, HOSP PERFORMED  I-STAT TROPOININ, ED    Imaging Review Dg Chest 2 View  11/08/2015  CLINICAL DATA:  Initial evaluation for acute left-sided chest pain, shortness of breath. EXAM: CHEST  2 VIEW COMPARISON:  Prior radiograph 04/25/2015. FINDINGS: Mild cardiomegaly is stable. Mediastinal silhouette within normal limits. Lungs are normally inflated. Mild diffuse bronchitic changes. No pulmonary edema or pleural effusion. No pneumothorax. No acute osseous abnormality. IMPRESSION: 1. Mild diffuse peribronchial thickening, which may reflect acute bronchiolitis in the setting of cough and shortness of breath. 2. No other active cardiopulmonary disease. Electronically Signed   By: Jeannine Boga M.D.   On: 11/08/2015 22:25   I have personally reviewed and evaluated these images and lab results as part of my medical decision-making.   02/26/14 Left heart cath:  IMPRESSIONS:  1. Normal left main coronary artery. 2. Normal left anterior descending artery and its branches. 3. Normal left circumflex artery and its branches. 4. Normal right coronary artery. 5. Left ventricular systolic function not assessed. LVEDP 16 mmHg.  MDM   Final diagnoses:  Acute on chronic congestive heart failure, unspecified congestive heart failure type ([place])  Postpartum cardiomyopathy  Hypokalemia  Tachycardia  Chest pain, unspecified chest pain type  Shortness of breath  Diabetes mellitus  without complication ([place])   [name] presents with shortness of breath, chest pain and cough onset approximately 24 hours ago. Patient with history of CHF and postpartum cardiomyopathy.  Patient reports symptoms are similar to previous episodes.  X-ray shows potential bronchiolitis however patient is without URI symptoms and has had no sick contacts.  Breath sounds are diminished without wheezes or rales to suggest pulmonary edema or asthma exacerbation. Patient has no history of asthma but does use albuterol for bronchospasm at home.  Will give albuterol nebulizer here.  History of DVT and is not currently anticoagulated. She had right calf pain several days ago and increased swelling of the right leg. No other increased risk for DVT.  Her d-dimer is 1.79.  Record review shows that patient often has elevated d-dimer and requires a VQ scan but no PE has been identified. Her BNP is elevated at 378 suggestive of a congestive heart failure exacerbation. She has been given 80 mg of Lasix IV.  Patient with initial oxygen saturations 93%. On my exam she has increased work of breathing, accessory muscle usage.  Negative troponin and mild height cocaine lamia. Patient also with mild anemia. She will need admission for further diuresis and monitoring.  Patient will also need VQ scan a while admitted.    The patient was discussed with and seen by Dr. Kathrynn Humble who agrees with the treatment plan.    Jarrett Soho Demica Zook, PA-C 11/09/15 [place], MD 11/09/15 [id number]

## 2015-11-09 NOTE — Progress Notes (Signed)
ANTICOAGULATION CONSULT NOTE - Initial Consult  Pharmacy Consult for heparin Indication: r/o VTE  Allergies  Allergen Reactions  . Vancomycin Other (See Comments)    "did something to my kidneys"; went into kidney failure  . Ivp Dye [Iodinated Diagnostic Agents] Other (See Comments)    Multiple CT contrast studies done over 2 weeks causing ARF  . Aspirin Other (See Comments)    Wheezing; but patient still takes if she needs to  . Ciprofloxacin Itching and Rash  . Sulfa Antibiotics Itching and Rash    Patient Measurements: Height: 5\' 6"  (167.6 cm) Weight: 243 lb 6.4 oz (110.406 kg) IBW/kg (Calculated) : 59.3 Heparin Dosing Weight: 85kg  Vital Signs: Temp: 97.9 F (36.6 C) (06/17 1205) Temp Source: Oral (06/17 1205) BP: 109/58 mmHg (06/17 1205) Pulse Rate: 89 (06/17 1205)  Labs:  Recent Labs  11/08/15 2149 11/09/15 0526 11/09/15 0847 11/09/15 1134  HGB 11.9*  --   --   --   HCT 37.4  --   --   --   PLT 234  --   --   --   APTT  --  31  --   --   LABPROT  --  13.3  --   --   INR  --  0.99  --   --   HEPARINUNFRC  --   --   --  0.59  CREATININE 0.84  --   --   --   TROPONINI  --  <0.03 <0.03  --     Estimated Creatinine Clearance: 110.9 mL/min (by C-G formula based on Cr of 0.84).   Medical History: Past Medical History  Diagnosis Date  . Hypertension   . Ovarian cyst     1999; surgically removed  . OSA (obstructive sleep apnea)     on CPAP   . IBS (irritable bowel syndrome)   . 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 Depression   . Anxiety   . Fibroid     age 53  . Termination of pregnancy     due to cardiac risk  . DVT (deep venous thrombosis) ([place])     2014  . CHF (congestive heart failure) ([place])   . Gallstones   . Diabetes mellitus without complication [facility])     Assessment: 40yo female c/o CP/pressure w/ SOB, D-dimer elevated, awaiting VQ scan, concern for VTE. Pharmacy consulted to dose heparin. HL is therapeutic.  CBC wnl.  Goal of Therapy:  Heparin level 0.3-0.7 units/ml Monitor platelets by anticoagulation protocol: Yes   Plan:  Continue Heparin at 1300 units/hr 6 hour HL to confirm therapeutic Daily HL and CBC  Melburn Popper, PharmD Clinical Pharmacy Resident Pager: [phone] 11/09/2015 12:15 PM

## 2015-11-09 NOTE — H&P (Signed)
History and Physical    [name] [id number] DOB: January 16, 1976 DOA: 11/09/2015  Referring MD/NP/PA:   PCP: No primary care provider on file.   Patient coming from:  The patient is coming from home.  At baseline, pt is independent for most of ADL.       Chief Complaint: Chest pain, SOB and R calf pain  HPI: [name] is a 40 y.o. female with medical history significant of hypertension, diabetes mellitus, IBS, depression, anxiety, OSA on CPAP, IBS, DVT not on anticoagulants, chronic combined systolic and diastolic CHF, who presents with chest pain, shortness of breath, right calf pain.  Patient states that she started having chest pain since yesterday morning at 3 AM. It is located in the left side of chest, just below rib cage, constant, pressure-like, nonradiating. It is pleuric, aggravated by deep breath. Patient has dry cough, but no fever or chills. She also has right calf tenderness since Monday. Patient has mild diarrhea due to IBS, which is at baseline. She has nausea, but no vomiting, abdominal pain. No symptoms of UTI or unilateral weakness. Of note, her dry weight is about 241, today for body weight is stable at 241 per patient.  ED Course: pt was found to have elevated d-dimer 1.79, BNP 378, negative troponin, WBC 7.2, temperature normal, tachycardia, oxygen saturation 98%, potassium 3.4, creatinine normal. Chest x-ray showed bronchitic change. Patient is placed on telemetry bed for observation. 40 mg of lasix was given by EDP.  Review of Systems:   General: no fevers, chills, no changes in body weight, has poor appetite, has fatigue HEENT: no blurry vision, hearing changes or sore throat Pulm: has dyspnea, coughing, no wheezing CV: has chest pain, no palpitations Abd: has nausea, no vomiting, abdominal pain, has diarrhea, constipation GU: no dysuria, burning on urination, increased urinary frequency, hematuria  Ext: has trace leg edema. Has R calf pain. Neuro: no  unilateral weakness, numbness, or tingling, no vision change or hearing loss Skin: no rash MSK: No muscle spasm, no deformity, no limitation of range of movement in spin Heme: No easy bruising.  Travel history: No recent long distant travel.  Allergy:  Allergies  Allergen Reactions  . Ivp Dye [Iodinated Diagnostic Agents] Other (See Comments)    Did something to my liver and kidneys told not to use it  . Vancomycin Other (See Comments)    "did something to my kidneys"; went into kidney failure  . Aspirin Other (See Comments)    Wheezing; but patient still takes if she needs to  . Ciprofloxacin Itching and Rash  . Sulfa Antibiotics Itching and Rash    Past Medical History  Diagnosis Date  . Hypertension   . Ovarian cyst     1999; surgically removed  . OSA (obstructive sleep apnea)     on CPAP   . IBS (irritable bowel syndrome)   . 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 Depression   . Anxiety   . Fibroid     age 51  . Termination of pregnancy     due to cardiac risk  . DVT (deep venous thrombosis) ([place])     2014  . CHF (congestive heart failure) ([place])   . Gallstones   . Diabetes mellitus without complication [facility])     Past Surgical History  Procedure Laterality Date  . Abdominal surgery      several  . Cesarean section    . Cholecystectomy    .  Foot surgery Right   . Tubal ligation  1999  . Left heart catheterization with coronary angiogram N/A 02/26/2014    Procedure: LEFT HEART CATHETERIZATION WITH CORONARY ANGIOGRAM;  Surgeon: Jettie Booze, MD;  Location: [facility] CATH LAB;  Service: Cardiovascular;  Laterality: N/A;    Social History:  reports that she has never smoked. She has never used smokeless tobacco. She reports that she does not drink alcohol or use illicit drugs.  Family History:  Family History  Problem Relation Age of Onset  . Emphysema Maternal Grandmother     smoked  . Allergies Daughter   . Heart disease Maternal  Grandmother 20    MI  . Rheum arthritis Mother      Prior to Admission medications   Medication Sig Start Date End Date Taking? Authorizing Provider  albuterol (PROVENTIL HFA;VENTOLIN HFA) 108 (90 BASE) MCG/ACT inhaler Inhale 2 puffs into the lungs every 6 (six) hours as needed for wheezing or shortness of breath. 04/29/15  Yes Janith Lima, MD  carvedilol (COREG) 25 MG tablet Take 1 tablet (25 mg total) by mouth 2 (two) times daily with a meal. 03/11/15  Yes Lelon Perla, MD  colesevelam (WELCHOL) 625 MG tablet Take 1 tablet (625 mg total) by mouth 3 (three) times daily with meals. 12/25/14  Yes Rowe Clack, MD  furosemide (LASIX) 40 MG tablet Take 2 tablets (80 mg total) by mouth 2 (two) times daily. 01/11/14  Yes Lelon Perla, MD  gabapentin (NEURONTIN) 100 MG capsule Take 100 mg by mouth daily as needed. For pain   Yes Historical Provider, MD  Liraglutide (VICTOZA) 18 MG/3ML SOPN Inject 0.3 mLs (1.8 mg total) into the skin daily. 02/15/15  Yes Golden Circle, FNP  losartan (COZAAR) 100 MG tablet Take 1 tablet (100 mg total) by mouth daily. 09/10/14  Yes Lelon Perla, MD  potassium chloride SA (K-DUR,KLOR-CON) 20 MEQ tablet Take 2 tablets (40 mEq total) by mouth 2 (two) times daily. 02/17/13  Yes Lelon Perla, MD  spironolactone (ALDACTONE) 25 MG tablet Take 1 tablet (25 mg total) by mouth daily. 10/20/13  Yes Lelon Perla, MD  venlafaxine XR (EFFEXOR-XR) 75 MG 24 hr capsule Take 225 mg by mouth daily with breakfast.   Yes Historical Provider, MD  cyclobenzaprine (FLEXERIL) 10 MG tablet Take 1 tablet (10 mg total) by mouth 3 (three) times daily as needed for muscle spasms. Patient not taking: Reported on 11/09/2015 05/01/15   Lyndal Pulley, DO  fluticasone [facility]) 50 MCG/ACT nasal spray Place 1 spray into both nostrils at bedtime. Before donning CPAP Patient not taking: Reported on 11/09/2015 05/23/13   Rowe Clack, MD  Insulin Pen Needle (PEN NEEDLES) 32G X 4  MM MISC Inject 1 pen into the skin daily as needed (to administer insulin.). 01/08/15   Rowe Clack, MD  meloxicam (MOBIC) 15 MG tablet Take 1 tablet (15 mg total) by mouth daily. Patient not taking: Reported on 11/09/2015 07/10/15   Lyndal Pulley, DO  Vitamin D, Ergocalciferol, (DRISDOL) 50000 UNITS CAPS capsule Take 1 capsule (50,000 Units total) by mouth every 7 (seven) days. Patient not taking: Reported on 11/09/2015 05/01/15   Lyndal Pulley, DO    Physical Exam: Filed Vitals:   11/09/15 0403 11/09/15 0415 11/09/15 0430 11/09/15 0500  BP: 132/70 128/66 117/83   Pulse: 104 106 103   Temp:      TempSrc:      Resp: 16 22  37   Height:    5\' 5"  (1.651 m)  Weight:    109.3 kg (240 lb 15.4 oz)  SpO2: 98% 97% 95%    General: Not in acute distress HEENT:       Eyes: PERRL, EOMI, no scleral icterus.       ENT: No discharge from the ears and nose, no pharynx injection, no tonsillar enlargement.        Neck: No JVD, no bruit, no mass felt. Heme: No neck lymph node enlargement. Cardiac: S1/S2, RRR, No murmurs, No gallops or rubs. Pulm: No rales, wheezing, rhonchi or rubs. Abd: Soft, nondistended, nontender, no rebound pain, no organomegaly, BS present. GU: No hematuria Ext: has trace leg edema bilaterally. 2+DP/PT pulse bilaterally. Has R calf tenderness. Musculoskeletal: No joint deformities, No joint redness or warmth, no limitation of ROM in spin. Skin: No rashes.  Neuro: Alert, oriented X3, cranial nerves II-XII grossly intact, moves all extremities normally. Psych: Patient is not psychotic, no suicidal or hemocidal ideation.  Labs on Admission: I have personally reviewed following labs and imaging studies  CBC:  Recent Labs Lab 11/08/15 2149  WBC 7.2  HGB 11.9*  HCT 37.4  MCV 85.6  PLT [id number]   Basic Metabolic Panel:  Recent Labs Lab 11/08/15 2149  NA 137  K 3.4*  CL 108  CO2 24  GLUCOSE 112*  BUN 9  CREATININE 0.84  CALCIUM 9.1   GFR: Estimated  Creatinine Clearance: 108.4 mL/min (by C-G formula based on Cr of 0.84). Liver Function Tests: No results for input(s): AST, ALT, ALKPHOS, BILITOT, PROT, ALBUMIN in the last 168 hours. No results for input(s): LIPASE, AMYLASE in the last 168 hours. No results for input(s): AMMONIA in the last 168 hours. Coagulation Profile: No results for input(s): INR, PROTIME in the last 168 hours. Cardiac Enzymes: No results for input(s): CKTOTAL, CKMB, CKMBINDEX, TROPONINI in the last 168 hours. BNP (last 3 results) No results for input(s): PROBNP in the last 8760 hours. HbA1C: No results for input(s): HGBA1C in the last 72 hours. CBG: No results for input(s): GLUCAP in the last 168 hours. Lipid Profile: No results for input(s): CHOL, HDL, LDLCALC, TRIG, CHOLHDL, LDLDIRECT in the last 72 hours. Thyroid Function Tests: No results for input(s): TSH, T4TOTAL, FREET4, T3FREE, THYROIDAB in the last 72 hours. Anemia Panel: No results for input(s): VITAMINB12, FOLATE, FERRITIN, TIBC, IRON, RETICCTPCT in the last 72 hours. Urine analysis:    Component Value Date/Time   COLORURINE YELLOW 07/19/2013 [place] 07/19/2013 0449   LABSPEC 1.020 07/19/2013 0449   PHURINE 7.0 07/19/2013 0449   GLUCOSEU NEGATIVE 07/19/2013 0449   HGBUR SMALL* 07/19/2013 0449   BILIRUBINUR NEGATIVE 07/19/2013 0449   KETONESUR 15* 07/19/2013 0449   PROTEINUR NEGATIVE 07/19/2013 0449   UROBILINOGEN 0.2 07/19/2013 0449   NITRITE NEGATIVE 07/19/2013 0449   LEUKOCYTESUR NEGATIVE 07/19/2013 0449   Sepsis Labs: @LABRCNTIP (procalcitonin:4,lacticidven:4) )No results found for this or any previous visit (from the past 240 hour(s)).   Radiological Exams on Admission: Dg Chest 2 View  11/08/2015  CLINICAL DATA:  Initial evaluation for acute left-sided chest pain, shortness of breath. EXAM: CHEST  2 VIEW COMPARISON:  Prior radiograph 04/25/2015. FINDINGS: Mild cardiomegaly is stable. Mediastinal silhouette within  normal limits. Lungs are normally inflated. Mild diffuse bronchitic changes. No pulmonary edema or pleural effusion. No pneumothorax. No acute osseous abnormality. IMPRESSION: 1. Mild diffuse peribronchial thickening, which may reflect acute bronchiolitis in the setting of cough and shortness  of breath. 2. No other active cardiopulmonary disease. Electronically Signed   By: Jeannine Boga M.D.   On: 11/08/2015 22:25     EKG: Independently reviewed.Sinus rhythm, QTC 493, tachycardia, LAD, anteroseptal infarction pattern, occasional PVC, LAE.  Assessment/Plan Principal Problem:   Chest pain Active Problems:   Hypertension   OSA (obstructive sleep apnea)   Depression with anxiety   Obesity, morbid, BMI 40.0-49.9 (HCC)   Chronic combined systolic and diastolic heart failure (HCC)   SOB (shortness of breath)   Diabetes mellitus without complication (HCC)   Chest pain: Patient has a pruritic chest pain, right cough tenderness and elevated D-dimer. She has history of DVT. It is very concerning for acute PE. Chest x-ray showed bronchitis change, but the patient does not have leukocytosis and fever, clinically does not seem to have infection. Initial trop negative. ACS can not be completely ruled out.  -will place on tele bed for obs -heparin drip initiated -will get V/Q scan in AM (pt is allergic to iodine, cannot do CTA) -2D echocardiogram ordered -LE dopplers ordered to evaluate for DVT -trop x 3 -pain control: When necessary Percocet and morphine -When necessary nitroglycerin -prn albuterol nebs for SOB - check UDS, A1c and FLP  Chronic combined systolic and diastolic heart failure ([place]): 2-D echo on 02/21/14 showed EF of 35-40 percent with grade 2 diastolic dysfunction. Patient is on Lasix 80 mg twice a day and spironolactone at home. BNP slightly elevated at 378, but her body weight is stable. She has only trace amount of leg edema. Does not seem to have CHF exacerbation. She  received 1 dose of Lasix 40 mg IV in the emergency room. Due to concerning for PE, we'll not escalate diuresis. -Continue Coreg -Continue home dose of Lasix and spironolactone  HTN:  -continue Coreg, Cozaar, -Patient is also on Lasix and spironolactone for congestive heart failure.  OSA: -CPAP  Depression and anxiety: Stable, no suicidal or homicidal ideations. -Continue home medications: Effexor and prn Xanax  DM-II: Last A1c not on record. Patient is taking Victoza at home -SSI -Check A1c  DVT ppx: on IV  Heparin         Code Status: Full code Family Communication: None at bed side. Disposition Plan:  Anticipate discharge back to previous home environment Consults called:  none Admission status: Obs / tele   Date of Service 11/09/2015    Ivor Costa [facility] Pager [phone]  If 7PM-7AM, please contact night-coverage [email or web address] Password [facility] 11/09/2015, 5:19 AM

## 2015-11-09 NOTE — Progress Notes (Signed)
[place] for heparin Indication: r/o VTE  Allergies  Allergen Reactions  . Vancomycin Other (See Comments)    "did something to my kidneys"; went into kidney failure  . Ivp Dye [Iodinated Diagnostic Agents] Other (See Comments)    Multiple CT contrast studies done over 2 weeks causing ARF  . Aspirin Other (See Comments)    Wheezing; but patient still takes if she needs to  . Ciprofloxacin Itching and Rash  . Sulfa Antibiotics Itching and Rash    Patient Measurements: Height: 5\' 6"  (167.6 cm) Weight: 243 lb 6.4 oz (110.406 kg) IBW/kg (Calculated) : 59.3 Heparin Dosing Weight: 85kg  Vital Signs: Temp: 98.1 F (36.7 C) (06/17 1713) Temp Source: Oral (06/17 1713) BP: 122/85 mmHg (06/17 1713) Pulse Rate: 85 (06/17 1713)  Labs:  Recent Labs  11/08/15 2149 11/09/15 0526 11/09/15 0847 11/09/15 1134 11/09/15 1812  HGB 11.9*  --   --   --   --   HCT 37.4  --   --   --   --   PLT 234  --   --   --   --   APTT  --  31  --   --   --   LABPROT  --  13.3  --   --   --   INR  --  0.99  --   --   --   HEPARINUNFRC  --   --   --  0.59 0.56  CREATININE 0.84  --   --   --   --   TROPONINI  --  <0.03 <0.03 <0.03  --     Estimated Creatinine Clearance: 110.9 mL/min (by C-G formula based on Cr of 0.84).   Medical History: Past Medical History  Diagnosis Date  . Hypertension   . Ovarian cyst     1999; surgically removed  . OSA (obstructive sleep apnea)     on CPAP   . IBS (irritable bowel syndrome)   . 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 Depression   . Anxiety   . Fibroid     age 86  . Termination of pregnancy     due to cardiac risk  . DVT (deep venous thrombosis) ([place])     2014  . CHF (congestive heart failure) ([place])   . Gallstones   . Diabetes mellitus without complication [facility])     Assessment: 40yo female c/o CP/pressure w/ SOB, D-dimer elevated, awaiting VQ scan, concern for VTE. Pharmacy  consulted to dose heparin. HL is therapeutic. CBC wnl.  Confirmatory HL remains therapeutic at 0.56 on heparin 1300 units/hr. No issues with infusion or bleeding noted.  Goal of Therapy:  Heparin level 0.3-0.7 units/ml Monitor platelets by anticoagulation protocol: Yes   Plan:  Continue heparin 1300 units/hr Daily HL and CBC  Andrey Cota. Diona Foley, PharmD, [place] Clinical Pharmacist Pager [phone]  11/09/2015 7:30 PM

## 2015-11-09 NOTE — Progress Notes (Signed)
PROGRESS NOTE                                                                                                                                                                                                             Patient Demographics:    [name], is a 40 y.o. female, DOB - May 28, 1975, [id number]  Admit date - 11/09/2015   Admitting Physician Megan Costa, MD  Outpatient Primary MD for the patient is No primary care provider on file.  LOS -   Chief Complaint  Patient presents with  . Chest Pain  . Shortness of Breath       Brief Narrative    [name] is a 40 y.o. female with medical history significant of hypertension, diabetes mellitus, IBS, depression, anxiety, OSA on CPAP, IBS, DVT not on anticoagulants, chronic combined systolic and diastolic CHF, who presents with chest pain, shortness of breath, right calf pain.  Patient states that she started having chest pain since yesterday morning at 3 AM. It is located in the left side of chest, just below rib cage, constant, pressure-like, nonradiating. It is pleuric, aggravated by deep breath. Patient has dry cough, but no fever or chills. She also has right calf tenderness since Monday. Patient has mild diarrhea due to IBS, which is at baseline. She has nausea, but no vomiting, abdominal pain. No symptoms of UTI or unilateral weakness. Of note, her dry weight is about 241, today for body weight is stable at 241 per patient.  ED Course: pt was found to have elevated d-dimer 1.79, BNP 378, negative troponin, WBC 7.2, temperature normal, tachycardia, oxygen saturation 98%, potassium 3.4, creatinine normal. Chest x-ray showed bronchitic change. Patient is placed on telemetry bed for observation. 40 mg of lasix was given by EDP.    Subjective:    [name] today has, No headache, No chest pain, No abdominal pain - No Nausea, No new weakness tingling or  numbness, ++ Cough, no SOB    Assessment  & Plan :     1. Atypical chest pain with shortness of breath. Most likely due to acute bronchitis, she does have ongoing cough for the last several days, d-dimer is mildly elevated but I think this is due to infection, I do not suspect patient has DVT or PE. She has had a V/Q scans  in the last year and a half and both were negative, her allergy to CT angiogram dye is of acute renal failure after she had multiple CT angiogram studies within 2 weeks, have hydrated her, we lowered Lasix dose, skip ARB and get a CT angiogram study for definitive PE rule out. Lower extremity venous duplex will be done, echogram will be done which was all ordered during admission.  Personally think she has acute bronchitis, will place on azithromycin, sputum Gram stain and culture, she is already feeling a whole lot better, advance activity. If all testing negative discharge in the morning.  2. Chronic combined systolic and diastolic CHF. EF 40%, has nonischemic cardiomyopathy, currently compensated, continue Lasix, continue Coreg, continue Aldactone, skip ARB today due to IV dye exposure and potential history of renal failure with IV dye.  3. Essential hypertension. Stable on Coreg, Lasix and Aldactone.  4. Obstructive sleep apnea. CPAP at night.  5. Anxiety and depression. No acute issues, continue Effexor and as needed Xanax.  6. DM type II. Added sliding scale, post discharge continue Victoza.  No results found for: HGBA1C CBG (last 3)   Recent Labs  11/09/15 0736  GLUCAP 99      Code Status :  Full  Family Communication  :  None  Disposition Plan  :  DC in am likely  Consults  :  None  Procedures  :   CTA Chest  TTE  Lower extremity venous duplex  DVT Prophylaxis  :   Heparin GTT   Lab Results  Component Value Date   PLT 234 11/08/2015    Inpatient Medications  Scheduled Meds: . azithromycin  500 mg Oral Daily  . carvedilol  25 mg Oral  BID WC  . colesevelam  625 mg Oral TID WC  . [START ON 11/10/2015] furosemide  80 mg Oral Daily  . iopamidol      . [START ON 11/10/2015] losartan  100 mg Oral Daily  . spironolactone  25 mg Oral Daily  . venlafaxine XR  225 mg Oral Q breakfast   Continuous Infusions: . heparin 1,300 Units/hr (11/09/15 0534)   PRN Meds:.acetaminophen, albuterol, ALPRAZolam, gabapentin, morphine injection, nitroGLYCERIN, ondansetron (ZOFRAN) IV, oxyCODONE-acetaminophen  Antibiotics  :    Anti-infectives    Start     Dose/Rate Route Frequency Ordered Stop   11/09/15 0900  azithromycin (ZITHROMAX) tablet 500 mg     500 mg Oral Daily 11/09/15 0849           Objective:   Filed Vitals:   11/09/15 0500 11/09/15 0541 11/09/15 0608 11/09/15 0733  BP:  132/88 153/96 137/85  Pulse:  96 53 99  Temp:   98.4 F (36.9 C) 98.7 F (37.1 C)  TempSrc:   Oral Oral  Resp:  16 22 15   Height: 5\' 5"  (1.651 m)  5\' 6"  (1.676 m)   Weight: 109.3 kg (240 lb 15.4 oz)  110.406 kg (243 lb 6.4 oz)   SpO2:  99% 99% 96%    Wt Readings from Last 3 Encounters:  11/09/15 110.406 kg (243 lb 6.4 oz)  09/10/15 109.317 kg (241 lb)  07/10/15 109.317 kg (241 lb)     Intake/Output Summary (Last 24 hours) at 11/09/15 0954 Last data filed at 11/09/15 0600  Gross per 24 hour  Intake      0 ml  Output    400 ml  Net   -400 ml     Physical Exam  Awake Alert, Oriented X  3, No new F.N deficits, Normal affect [name],PERRAL Supple Neck,No JVD, No cervical lymphadenopathy appriciated.  Symmetrical Chest wall movement, Good air movement bilaterally, CTAB RRR,No Gallops,Rubs or new Murmurs, No Parasternal Heave +ve B.Sounds, Abd Soft, No tenderness, No organomegaly appriciated, No rebound - guarding or rigidity. No Cyanosis, Clubbing or edema, No new Rash or bruise      Data Review:    CBC  Recent Labs Lab 11/08/15 2149  WBC 7.2  HGB 11.9*  HCT 37.4  PLT 234  MCV 85.6  MCH 27.2  MCHC 31.8  RDW 13.5     Chemistries   Recent Labs Lab 11/08/15 2149  NA 137  K 3.4*  CL 108  CO2 24  GLUCOSE 112*  BUN 9  CREATININE 0.84  CALCIUM 9.1   ------------------------------------------------------------------------------------------------------------------  Recent Labs  11/09/15 0526  CHOL 160  HDL 58  LDLCALC 88  TRIG 70  CHOLHDL 2.8    No results found for: HGBA1C ------------------------------------------------------------------------------------------------------------------ No results for input(s): TSH, T4TOTAL, T3FREE, THYROIDAB in the last 72 hours.  Invalid input(s): FREET3 ------------------------------------------------------------------------------------------------------------------ No results for input(s): VITAMINB12, FOLATE, FERRITIN, TIBC, IRON, RETICCTPCT in the last 72 hours.  Coagulation profile  Recent Labs Lab 11/09/15 0526  INR 0.99     Recent Labs  11/08/15 2149  DDIMER 1.79*    Cardiac Enzymes  Recent Labs Lab 11/09/15 0526 11/09/15 0847  TROPONINI <0.03 <0.03   ------------------------------------------------------------------------------------------------------------------    Component Value Date/Time   BNP 378.0* 11/08/2015 2149    Micro Results No results found for this or any previous visit (from the past 240 hour(s)).  Radiology Reports Dg Chest 2 View  11/08/2015  CLINICAL DATA:  Initial evaluation for acute left-sided chest pain, shortness of breath. EXAM: CHEST  2 VIEW COMPARISON:  Prior radiograph 04/25/2015. FINDINGS: Mild cardiomegaly is stable. Mediastinal silhouette within normal limits. Lungs are normally inflated. Mild diffuse bronchitic changes. No pulmonary edema or pleural effusion. No pneumothorax. No acute osseous abnormality. IMPRESSION: 1. Mild diffuse peribronchial thickening, which may reflect acute bronchiolitis in the setting of cough and shortness of breath. 2. No other active cardiopulmonary disease.  Electronically Signed   By: Jeannine Boga M.D.   On: 11/08/2015 22:25    Time Spent in minutes  30   Chyrel Taha K M.D on 11/09/2015 at 9:54 AM  Between 7am to 7pm - Pager - [phone]  After 7pm go to [email or web address] - password [facility]  [facility] -  Office  [phone]

## 2015-11-09 NOTE — Consult Note (Signed)
CARDIOLOGY CONSULT NOTE       Patient ID: [name] MRN: [id number] DOB/AGE: 1975/10/26 40 y.o.  Admit date: 11/09/2015 Referring Physician: Candiss Norse Primary Physician: No primary care provider on file. Primary Cardiologist:  Stanford Breed Reason for Consultation:  Chest Pain  Principal Problem:   Chest pain Active Problems:   Hypertension   OSA (obstructive sleep apnea)   Depression with anxiety   Obesity, morbid, BMI 40.0-49.9 (HCC)   Chronic combined systolic and diastolic heart failure (HCC)   SOB (shortness of breath)   Diabetes mellitus without complication (HCC)   HPI:  40 y.o. obese black female admitted with SSCP. Pain 48hrs. Hurts to take deep breath. No fever sputum Feels wheezy.  Pain not positional. Can be at rest and worse laying down She indicates allergy to contrast "Shut my kidney down" but this was in setting of abdominal sepsis , 2 months hospital stay and Vancomycin. Had contrast for heart cath in 2015 with no Kidney issues. 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 EF by last echo 35-40 %.    Study Conclusions  - Left ventricle: The cavity size was normal. Systolic function was moderately reduced. The estimated ejection fraction was in the range of 35% to 40%. Wall motion was normal; there were no regional wall motion abnormalities. Features are consistent with a pseudonormal left ventricular filling pattern, with concomitant abnormal relaxation and increased filling pressure (grade 2 diastolic dysfunction). Doppler parameters are consistent with elevated ventricular end-diastolic filling pressure. - Aortic valve: There was no regurgitation. - Aortic root: The aortic root was normal in size. - Left atrium: The atrium was mildly dilated. - Right ventricle: Systolic function was normal. - Right atrium: The atrium was normal in size. - Tricuspid valve: There was mild regurgitation. - Pulmonary arteries: Systolic pressure was within the  normal range. - Pericardium, extracardiac: There was no pericardial effusion.  Cath in 2015  Normal coronary arteries.  Since admission istat troponin normal ECG normal no acute changes. CXR with bronchitis.   Also complained of right LE pain  ROS All other systems reviewed and negative except as noted above  Past Medical History  Diagnosis Date  . Hypertension   . Ovarian cyst     1999; surgically removed  . OSA (obstructive sleep apnea)     on CPAP   . IBS (irritable bowel syndrome)   . 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 Depression   . Anxiety   . Fibroid     age 67  . Termination of pregnancy     due to cardiac risk  . DVT (deep venous thrombosis) ([place])     2014  . CHF (congestive heart failure) ([place])   . Gallstones   . Diabetes mellitus without complication ([place])     Family History  Problem Relation Age of Onset  . Emphysema Maternal Grandmother     smoked  . Allergies Daughter   . Heart disease Maternal Grandmother 63    MI  . Rheum arthritis Mother     Social History   Social History  . Marital Status: Married    Spouse Name: N/A  . Number of Children: 1  . Years of Education: N/A   Occupational History  . stay at home mom    Social History Main Topics  . Smoking status: Never Smoker   . Smokeless tobacco: Never Used  . Alcohol Use: No  . Drug Use: No  . Sexual Activity:  Not on file   Other Topics Concern  . Not on file   Social History Narrative    Past Surgical History  Procedure Laterality Date  . Abdominal surgery      several  . Cesarean section    . Cholecystectomy    . Foot surgery Right   . Tubal ligation  1999  . Left heart catheterization with coronary angiogram N/A 02/26/2014    Procedure: LEFT HEART CATHETERIZATION WITH CORONARY ANGIOGRAM;  Surgeon: Jettie Booze, MD;  Location: [facility] CATH LAB;  Service: Cardiovascular;  Laterality: N/A;     . carvedilol  25 mg Oral BID WC  . colesevelam   625 mg Oral TID WC  . furosemide  80 mg Oral BID  . insulin aspart  0-5 Units Subcutaneous QHS  . insulin aspart  0-9 Units Subcutaneous TID WC  . losartan  100 mg Oral Daily  . spironolactone  25 mg Oral Daily  . venlafaxine XR  225 mg Oral Q breakfast   . heparin 1,300 Units/hr (11/09/15 0534)    Physical Exam: Blood pressure 137/85, pulse 99, temperature 98.7 F (37.1 C), temperature source Oral, resp. rate 15, height 5\' 6"  (1.676 m), weight 243 lb 6.4 oz (110.406 kg), last menstrual period 10/24/2015, SpO2 96 %.   Affect appropriate Obese black female  HEENT: normal Neck supple with no adenopathy JVP normal no bruits no thyromegaly Lungs clear with no wheezing and good diaphragmatic motion Heart:  S1/S2 no murmur, no rub, gallop or click PMI normal Abdomen: benighn, BS positve, no tenderness, no AAA no bruit.  No HSM or HJR Distal pulses intact with no bruits No edema Neuro non-focal Skin warm and dry No muscular weakness   Labs:   Lab Results  Component Value Date   WBC 7.2 11/08/2015   HGB 11.9* 11/08/2015   HCT 37.4 11/08/2015   MCV 85.6 11/08/2015   PLT 234 11/08/2015    Recent Labs Lab 11/08/15 2149  NA 137  K 3.4*  CL 108  CO2 24  BUN 9  CREATININE 0.84  CALCIUM 9.1  GLUCOSE 112*   Lab Results  Component Value Date   CKTOTAL 174 05/26/2012   CKMB 1.9 05/26/2012   TROPONINI <0.03 11/09/2015    Lab Results  Component Value Date   CHOL 160 11/09/2015   CHOL 145 10/15/2014   CHOL 144 02/15/2013   Lab Results  Component Value Date   HDL 58 11/09/2015   HDL 46.50 10/15/2014   HDL 43.30 02/15/2013   Lab Results  Component Value Date   LDLCALC 88 11/09/2015   LDLCALC 85 10/15/2014   LDLCALC 84 02/15/2013   Lab Results  Component Value Date   TRIG 70 11/09/2015   TRIG 70.0 10/15/2014   TRIG 83.0 02/15/2013   Lab Results  Component Value Date   CHOLHDL 2.8 11/09/2015   CHOLHDL 3 10/15/2014   CHOLHDL 3 02/15/2013   No results  found for: LDLDIRECT    Radiology: Dg Chest 2 View  11/08/2015  CLINICAL DATA:  Initial evaluation for acute left-sided chest pain, shortness of breath. EXAM: CHEST  2 VIEW COMPARISON:  Prior radiograph 04/25/2015. FINDINGS: Mild cardiomegaly is stable. Mediastinal silhouette within normal limits. Lungs are normally inflated. Mild diffuse bronchitic changes. No pulmonary edema or pleural effusion. No pneumothorax. No acute osseous abnormality. IMPRESSION: 1. Mild diffuse peribronchial thickening, which may reflect acute bronchiolitis in the setting of cough and shortness of breath. 2. No other active cardiopulmonary disease.  Electronically Signed   By: Jeannine Boga M.D.   On: 11/08/2015 22:25    EKG:  NSR normal    ASSESSMENT AND PLAN:  Chest Pain: not likely anginal syndrome. Normal cath 2015 normal ECG and negative enzymes. V/Q pending this am.  Consider LE venous duplex to check for DVT as well Lab Results  Component Value Date   DDIMER 1.79* 11/08/2015   Clinical syndrome more consistant with bronchitis with pleurisy.    DCM:  Post partum DCM for 17 years No active CHF  Continue home meds including coreg losartan and diuretic.  Update Echo  HTN:  Well controlled.  Continue current medications and low sodium Dash type diet.    Would have to wait 48 hrs after V/Q to do any time of nuclear cards study.   SignedJenkins Rouge 11/09/2015, 7:38 AM

## 2015-11-10 ENCOUNTER — Observation Stay (HOSPITAL_COMMUNITY): Payer: 59

## 2015-11-10 ENCOUNTER — Observation Stay (HOSPITAL_BASED_OUTPATIENT_CLINIC_OR_DEPARTMENT_OTHER): Payer: 59

## 2015-11-10 DIAGNOSIS — I509 Heart failure, unspecified: Secondary | ICD-10-CM | POA: Diagnosis not present

## 2015-11-10 DIAGNOSIS — R0602 Shortness of breath: Secondary | ICD-10-CM

## 2015-11-10 DIAGNOSIS — R079 Chest pain, unspecified: Secondary | ICD-10-CM | POA: Diagnosis not present

## 2015-11-10 DIAGNOSIS — M79661 Pain in right lower leg: Secondary | ICD-10-CM | POA: Diagnosis not present

## 2015-11-10 DIAGNOSIS — R0789 Other chest pain: Principal | ICD-10-CM

## 2015-11-10 DIAGNOSIS — R072 Precordial pain: Secondary | ICD-10-CM | POA: Diagnosis not present

## 2015-11-10 LAB — NM MYOCAR MULTI W/SPECT W/WALL MOTION / EF
Estimated workload: 1 METS
Exercise duration (min): 5 min
Exercise duration (sec): 2 s
MPHR: 179 {beats}/min
Peak HR: 105 {beats}/min
Percent HR: 58 %
Rest HR: 84 {beats}/min

## 2015-11-10 LAB — CBC
HCT: 38.9 % (ref 36.0–46.0)
Hemoglobin: 12.2 g/dL (ref 12.0–15.0)
MCH: 28 pg (ref 26.0–34.0)
MCHC: 31.4 g/dL (ref 30.0–36.0)
MCV: 89.4 fL (ref 78.0–100.0)
Platelets: 175 10*3/uL (ref 150–400)
RBC: 4.35 MIL/uL (ref 3.87–5.11)
RDW: 13.9 % (ref 11.5–15.5)
WBC: 7.3 10*3/uL (ref 4.0–10.5)

## 2015-11-10 LAB — BASIC METABOLIC PANEL
Anion gap: 10 (ref 5–15)
BUN: 11 mg/dL (ref 6–20)
CO2: 22 mmol/L (ref 22–32)
Calcium: 9.3 mg/dL (ref 8.9–10.3)
Chloride: 104 mmol/L (ref 101–111)
Creatinine, Ser: 0.84 mg/dL (ref 0.44–1.00)
GFR calc Af Amer: >60 mL/min (ref >60)
GFR calc non Af Amer: >60 mL/min (ref >60)
Glucose, Bld: 107 mg/dL — ABNORMAL HIGH (ref 65–99)
Potassium: 4.5 mmol/L (ref 3.5–5.1)
Sodium: 136 mmol/L (ref 135–145)

## 2015-11-10 LAB — GLUCOSE, CAPILLARY: Glucose-Capillary: 116 mg/dL — ABNORMAL HIGH (ref 65–99)

## 2015-11-10 LAB — ECHOCARDIOGRAM COMPLETE
E decel time: 151 msec
E/e' ratio: 12.79
FS: 10 % — AB (ref 28–44)
Height: 66 in
IVS/LV PW RATIO, ED: 0.93
LA ID, A-P, ES: 46 mm
LA diam end sys: 46 mm
LA diam index: 1.98 cm/m2
LA vol A4C: 98.5 ml
LA vol index: 48.7 mL/m2
LA vol: 113 mL
LV E/e' medial: 12.79
LV E/e'average: 12.79
LV PW d: 10.1 mm — AB (ref 0.6–1.1)
LV e' LATERAL: 9.46 cm/s
LVOT area: 3.14 cm2
LVOT diameter: 20 mm
Lateral S' vel: 11.3 cm/s
MV Dec: 151
MV Peak grad: 6 mmHg
MV pk A vel: 58.9 m/s
MV pk E vel: 121 m/s
RV sys press: 51 mmHg
Reg peak vel: 328 cm/s
TAPSE: 19.9 mm
TDI e' lateral: 9.46
TDI e' medial: 9.25
TR max vel: 328 cm/s
Weight: 3889.6 oz

## 2015-11-10 LAB — HEPARIN LEVEL (UNFRACTIONATED): Heparin Unfractionated: 0.48 IU/mL (ref 0.30–0.70)

## 2015-11-10 LAB — MAGNESIUM: Magnesium: 2.1 mg/dL (ref 1.7–2.4)

## 2015-11-10 MED ORDER — REGADENOSON 0.4 MG/5ML IV SOLN
INTRAVENOUS | Status: AC
  Filled 2015-11-10: qty 5

## 2015-11-10 MED ORDER — TECHNETIUM TC 99M TETROFOSMIN IV KIT
30.0000 | PACK | Freq: Once | INTRAVENOUS | Status: AC | PRN
  Administered 2015-11-10: 30 via INTRAVENOUS

## 2015-11-10 MED ORDER — HEPARIN SODIUM (PORCINE) 5000 UNIT/ML IJ SOLN
5000.0000 [IU] | Freq: Three times a day (TID) | INTRAMUSCULAR | Status: DC
  Administered 2015-11-11 – 2015-11-12 (×3): 5000 [IU] via SUBCUTANEOUS
  Filled 2015-11-10 (×3): qty 1

## 2015-11-10 MED ORDER — REGADENOSON 0.4 MG/5ML IV SOLN
0.4000 mg | Freq: Once | INTRAVENOUS | Status: AC
  Administered 2015-11-10: 0.4 mg via INTRAVENOUS
  Filled 2015-11-10: qty 5

## 2015-11-10 MED ORDER — FUROSEMIDE 80 MG PO TABS
80.0000 mg | ORAL_TABLET | Freq: Two times a day (BID) | ORAL | Status: DC
  Administered 2015-11-10: 80 mg via ORAL
  Filled 2015-11-10: qty 1

## 2015-11-10 MED ORDER — FUROSEMIDE 10 MG/ML IJ SOLN
40.0000 mg | Freq: Once | INTRAMUSCULAR | Status: AC
  Administered 2015-11-10: 40 mg via INTRAVENOUS
  Filled 2015-11-10: qty 4

## 2015-11-10 MED ORDER — TECHNETIUM TC 99M TETROFOSMIN IV KIT
10.0000 | PACK | Freq: Once | INTRAVENOUS | Status: AC | PRN
  Administered 2015-11-10: 10 via INTRAVENOUS

## 2015-11-10 NOTE — Progress Notes (Addendum)
This note also relates to the following rows which could not be included: ECG Heart Rate - Cannot attach notes to unvalidated device data   1 min, Rhonda PA at bedside, pt reports "heart racing" and SOB.

## 2015-11-10 NOTE — Progress Notes (Addendum)
This note also relates to the following rows which could not be included: ECG Heart Rate - Cannot attach notes to unvalidated device data   5 mins, Rhonda PA at bedside, pt denies any symptoms.  Stress test ended

## 2015-11-10 NOTE — Progress Notes (Signed)
*  PRELIMINARY RESULTS* Vascular Ultrasound Lower extremity venous duplex has been completed.  Preliminary findings: No evidence of DVT or baker's cyst.   .Landry Mellow, RDMS, RVT  11/10/2015, 9:03 AM

## 2015-11-10 NOTE — Progress Notes (Signed)
PRN Percocet given last night for chest pain and Albuterol for SOB, and was effective.

## 2015-11-10 NOTE — Progress Notes (Signed)
[place] for heparin Indication: r/o VTE  Allergies  Allergen Reactions  . Vancomycin Other (See Comments)    "did something to my kidneys"; went into kidney failure  . Ivp Dye [Iodinated Diagnostic Agents] Other (See Comments)    Multiple CT contrast studies done over 2 weeks causing ARF  . Aspirin Other (See Comments)    Wheezing; but patient still takes if she needs to  . Ciprofloxacin Itching and Rash  . Sulfa Antibiotics Itching and Rash    Patient Measurements: Height: 5\' 6"  (167.6 cm) Weight: 243 lb 1.6 oz (110.269 kg) IBW/kg (Calculated) : 59.3 Heparin Dosing Weight: 85kg  Vital Signs: Temp: 98 F (36.7 C) (06/18 0500) BP: 100/60 mmHg (06/18 0500) Pulse Rate: 88 (06/18 0500)  Labs:  Recent Labs  11/08/15 2149 11/09/15 0526 11/09/15 0847 11/09/15 1134 11/09/15 1812 11/10/15 0256  HGB 11.9*  --   --   --   --  12.2  HCT 37.4  --   --   --   --  38.9  PLT 234  --   --   --   --  175  APTT  --  31  --   --   --   --   LABPROT  --  13.3  --   --   --   --   INR  --  0.99  --   --   --   --   HEPARINUNFRC  --   --   --  0.59 0.56 0.48  CREATININE 0.84  --   --   --   --  0.84  TROPONINI  --  <0.03 <0.03 <0.03  --   --     Estimated Creatinine Clearance: 110.9 mL/min (by C-G formula based on Cr of 0.84).   Medical History: Past Medical History  Diagnosis Date  . Hypertension   . Ovarian cyst     1999; surgically removed  . OSA (obstructive sleep apnea)     on CPAP   . IBS (irritable bowel syndrome)   . 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 Depression   . Anxiety   . Fibroid     age 13  . Termination of pregnancy     due to cardiac risk  . DVT (deep venous thrombosis) ([place])     2014  . CHF (congestive heart failure) ([place])   . Gallstones   . Diabetes mellitus without complication [facility])     Assessment: 39yo female c/o CP/pressure w/ SOB and D-dimer elevated, with concern for VTE.  Pharmacy consulted to dose heparin. HL is therapeutic this am and CBC wnl.  HL remains therapeutic on heparin 1300 units/hr. No issues with infusion or bleeding noted.  Goal of Therapy:  Heparin level 0.3-0.7 units/ml Monitor platelets by anticoagulation protocol: Yes   Plan:  Continue heparin 1300 units/hr Daily HL and CBC   Melburn Popper, PharmD Clinical Pharmacy Resident Pager: [phone] 11/10/2015 7:25 AM

## 2015-11-10 NOTE — Progress Notes (Signed)
  Echocardiogram 2D Echocardiogram has been performed.  [name] 11/10/2015, 9:44 AM

## 2015-11-10 NOTE — Progress Notes (Addendum)
This note also relates to the following rows which could not be included: ECG Heart Rate - Cannot attach notes to unvalidated device data   Pre stress test VS

## 2015-11-10 NOTE — Progress Notes (Signed)
Patient ID: [name], female   DOB: 1975-10-17, 40 y.o.   MRN: [id number]    Subjective:  Pleuritic pain better   Objective:  Filed Vitals:   11/09/15 2000 11/10/15 0000 11/10/15 0500 11/10/15 0819  BP: 100/53 138/61 100/60 114/60  Pulse: 81 80 88 90  Temp: 98.5 F (36.9 C) 99.8 F (37.7 C) 98 F (36.7 C) 98.3 F (36.8 C)  TempSrc:    Oral  Resp: 18 20 17 18   Height:      Weight:   243 lb 1.6 oz (110.269 kg)   SpO2: 99% 97% 100% 98%    Intake/Output from previous day:  Intake/Output Summary (Last 24 hours) at 11/10/15 1113 Last data filed at 11/10/15 [id number]  Gross per 24 hour  Intake    240 ml  Output   1300 ml  Net  -1060 ml    Physical Exam: Affect appropriate Obese black female  HEENT: normal Neck supple with no adenopathy JVP normal no bruits no thyromegaly Lungs clear with no wheezing and good diaphragmatic motion Heart:  S1/S2 no murmur, no rub, gallop or click PMI normal Abdomen: benighn, BS positve, no tenderness, no AAA no bruit.  No HSM or HJR Distal pulses intact with no bruits No edema Neuro non-focal Skin warm and dry No muscular weakness   Lab Results: Basic Metabolic Panel:  Recent Labs  11/08/15 2149 11/10/15 0256  NA 137 136  K 3.4* 4.5  CL 108 104  CO2 24 22  GLUCOSE 112* 107*  BUN 9 11  CREATININE 0.84 0.84  CALCIUM 9.1 9.3  MG  --  2.1   CBC:  Recent Labs  11/08/15 2149 11/10/15 0256  WBC 7.2 7.3  HGB 11.9* 12.2  HCT 37.4 38.9  MCV 85.6 89.4  PLT 234 175   Cardiac Enzymes:  Recent Labs  11/09/15 0526 11/09/15 0847 11/09/15 1134  TROPONINI <0.03 <0.03 <0.03   BNP: Invalid input(s): POCBNP D-Dimer:  Recent Labs  11/08/15 2149  DDIMER 1.79*   Fasting Lipid Panel:  Recent Labs  11/09/15 0526  CHOL 160  HDL 58  LDLCALC 88  TRIG 70  CHOLHDL 2.8    Imaging: Dg Chest 2 View  11/08/2015  CLINICAL DATA:  Initial evaluation for acute left-sided chest pain, shortness of breath. EXAM: CHEST  2  VIEW COMPARISON:  Prior radiograph 04/25/2015. FINDINGS: Mild cardiomegaly is stable. Mediastinal silhouette within normal limits. Lungs are normally inflated. Mild diffuse bronchitic changes. No pulmonary edema or pleural effusion. No pneumothorax. No acute osseous abnormality. IMPRESSION: 1. Mild diffuse peribronchial thickening, which may reflect acute bronchiolitis in the setting of cough and shortness of breath. 2. No other active cardiopulmonary disease. Electronically Signed   By: Jeannine Boga M.D.   On: 11/08/2015 22:25   Ct Angio Chest Pe W Or Wo Contrast  11/09/2015  CLINICAL DATA:  Left-sided chest pain EXAM: CT ANGIOGRAPHY CHEST WITH CONTRAST TECHNIQUE: Multidetector CT imaging of the chest was performed using the standard protocol during bolus administration of intravenous contrast. Multiplanar CT image reconstructions and MIPs were obtained to evaluate the vascular anatomy. CONTRAST:  New 75 cc Isovue 370 COMPARISON:  None. FINDINGS: Mediastinum/Lymph Nodes: No pulmonary emboli or thoracic aortic dissection identified. No masses or pathologically enlarged lymph nodes identified. The heart is enlarged. Lungs/Pleura: No pneumothorax or pleural effusion. There is ground-glass opacity likely related to volume loss. Dependent atelectasis at the left base. Upper abdomen: No acute findings. Musculoskeletal: No chest wall mass or suspicious  bone lesions identified. Review of the MIP images confirms the above findings. IMPRESSION: No acute pulmonary thromboembolism. Electronically Signed   By: Marybelle Killings M.D.   On: 11/09/2015 10:47    Cardiac Studies:  ECG: SR bigemminy no acute ST changes    Telemetry:  NSR periods of bigeminny and PvCs   11/10/2015   Echo: pending   Medications:   . azithromycin  500 mg Oral Daily  . carvedilol  25 mg Oral BID WC  . colesevelam  625 mg Oral TID WC  . furosemide  80 mg Oral Daily  . losartan  100 mg Oral Daily  . regadenoson      . regadenoson   0.4 mg Intravenous Once  . spironolactone  25 mg Oral Daily  . venlafaxine XR  225 mg Oral Q breakfast     . heparin 1,300 Units/hr (11/09/15 2026)    Assessment/Plan:  Chest Pain: atypical Primary service ended up doing CT not V/Q for r/o PE CT negative.  Will proceed with myovue 2 day study given size Normal cors cath 2015   CHF :  Longstanding postpartum DCM.  Echo pending continue beta blocker lasix/aldactone and ARB  Good diuresis in hospital  PVC;s  May need outpatient monitor pending EF and myovue evaluation. Will need right and left cath if echo has wose EF or ischemia on myovue  Jenkins Rouge 11/10/2015, 11:13 AM

## 2015-11-10 NOTE — Progress Notes (Addendum)
This note also relates to the following rows which could not be included: ECG Heart Rate - Cannot attach notes to unvalidated device data   3 min, Rhonda PA at bedside  pt reports "racing heart' and SOB has went away.

## 2015-11-10 NOTE — Progress Notes (Signed)
Patient Name: [name] Date of Encounter: 11/10/2015  Principal Problem:   Chest pain Active Problems:   Hypertension   OSA (obstructive sleep apnea)   Depression with anxiety   Obesity, morbid, BMI 40.0-49.9 (HCC)   Chronic combined systolic and diastolic heart failure (HCC)   SOB (shortness of breath)   Diabetes mellitus without complication [facility])   Primary Cardiologist: Dr Stanford Breed  Patient Profile: 40 yo female w/ hx HTN, OSA, morbid obesity, S-D-CHF w/ nl cors at cath 02/2014 and EF 35-40% 01/2014, DM, was admitted 06/17 with CP, MV planned.  SUBJECTIVE: No chest pain, still a little SOB  OBJECTIVE Filed Vitals:   11/09/15 2000 11/10/15 0000 11/10/15 0500 11/10/15 0819  BP: 100/53 138/61 100/60 114/60  Pulse: 81 80 88 90  Temp: 98.5 F (36.9 C) 99.8 F (37.7 C) 98 F (36.7 C) 98.3 F (36.8 C)  TempSrc:    Oral  Resp: 18 20 17 18   Height:      Weight:   243 lb 1.6 oz (110.269 kg)   SpO2: 99% 97% 100% 98%    Intake/Output Summary (Last 24 hours) at 11/10/15 1053 Last data filed at 11/10/15 0909  Gross per 24 hour  Intake    240 ml  Output   1300 ml  Net  -1060 ml   Filed Weights   11/09/15 0500 11/09/15 0608 11/10/15 0500  Weight: 240 lb 15.4 oz (109.3 kg) 243 lb 6.4 oz (110.406 kg) 243 lb 1.6 oz (110.269 kg)    PHYSICAL EXAM General: Well developed, well nourished, female in no acute distress. Head: Normocephalic, atraumatic.  Neck: Supple without bruits, JVD not elevated. Lungs:  Resp regular and unlabored, decreased BS bases. Heart: RRR, S1, S2, no S3, S4, 2/6 murmur; no rub. Abdomen: Soft, non-tender, non-distended, BS + x 4.  Extremities: No clubbing, cyanosis, edema.  Neuro: Alert and oriented X 3. Moves all extremities spontaneously. Psych: Normal affect.  LABS: CBC: Recent Labs  11/08/15 2149 11/10/15 0256  WBC 7.2 7.3  HGB 11.9* 12.2  HCT 37.4 38.9  MCV 85.6 89.4  PLT 234 175   INR: Recent Labs  11/09/15 0526  INR  [id number]   Basic Metabolic Panel: Recent Labs  11/08/15 2149 11/10/15 0256  NA 137 136  K 3.4* 4.5  CL 108 104  CO2 24 22  GLUCOSE 112* 107*  BUN 9 11  CREATININE 0.84 0.84  CALCIUM 9.1 9.3  MG  --  2.1   Cardiac Enzymes: Recent Labs  11/09/15 0526 11/09/15 0847 11/09/15 1134  TROPONINI <0.03 <0.03 <0.03    Recent Labs  11/08/15 2230  TROPIPOC 0.00   BNP:  B NATRIURETIC PEPTIDE  Date/Time Value Ref Range Status  11/08/2015 09:49 PM 378.0* 0.0 - 100.0 pg/mL Final  04/25/2015 11:33 PM 61.4 0.0 - 100.0 pg/mL Final   D-dimer: Recent Labs  11/08/15 2149  DDIMER 1.79*   Fasting Lipid Panel: Recent Labs  11/09/15 0526  CHOL 160  HDL 58  LDLCALC 88  TRIG 70  CHOLHDL 2.8   TELE: strips in Epic reviewed, SR/ST w/ occ PVCs        ECHO: 11/10/2015 - Left ventricle: The cavity size was moderately dilated. Wall  thickness was normal. Systolic function was severely reduced. The  estimated ejection fraction was in the range of 25% to 30%.  Diffuse hypokinesis. Left ventricular diastolic function  parameters were normal. - Mitral valve: There was mild to moderate regurgitation. - Left  atrium: The atrium was moderately dilated. - Right atrium: The atrium was mildly dilated. - Atrial septum: No defect or patent foramen ovale was identified. - Tricuspid valve: There was moderate regurgitation. - Pulmonary arteries: PA peak pressure: 51 mm Hg (S).  Radiology/Studies: Dg Chest 2 View 11/08/2015  CLINICAL DATA:  Initial evaluation for acute left-sided chest pain, shortness of breath. EXAM: CHEST  2 VIEW COMPARISON:  Prior radiograph 04/25/2015. FINDINGS: Mild cardiomegaly is stable. Mediastinal silhouette within normal limits. Lungs are normally inflated. Mild diffuse bronchitic changes. No pulmonary edema or pleural effusion. No pneumothorax. No acute osseous abnormality. IMPRESSION: 1. Mild diffuse peribronchial thickening, which may reflect acute bronchiolitis in the  setting of cough and shortness of breath. 2. No other active cardiopulmonary disease. Electronically Signed   By: Jeannine Boga M.D.   On: 11/08/2015 22:25   Ct Angio Chest Pe W Or Wo Contrast 11/09/2015  CLINICAL DATA:  Left-sided chest pain EXAM: CT ANGIOGRAPHY CHEST WITH CONTRAST TECHNIQUE: Multidetector CT imaging of the chest was performed using the standard protocol during bolus administration of intravenous contrast. Multiplanar CT image reconstructions and MIPs were obtained to evaluate the vascular anatomy. CONTRAST:  New 75 cc Isovue 370 COMPARISON:  None. FINDINGS: Mediastinum/Lymph Nodes: No pulmonary emboli or thoracic aortic dissection identified. No masses or pathologically enlarged lymph nodes identified. The heart is enlarged. Lungs/Pleura: No pneumothorax or pleural effusion. There is ground-glass opacity likely related to volume loss. Dependent atelectasis at the left base. Upper abdomen: No acute findings. Musculoskeletal: No chest wall mass or suspicious bone lesions identified. Review of the MIP images confirms the above findings. IMPRESSION: No acute pulmonary thromboembolism. Electronically Signed   By: Marybelle Killings M.D.   On: 11/09/2015 10:47    Current Medications:  . azithromycin  500 mg Oral Daily  . carvedilol  25 mg Oral BID WC  . colesevelam  625 mg Oral TID WC  . furosemide  80 mg Oral Daily  . losartan  100 mg Oral Daily  . regadenoson  0.4 mg Intravenous Once  . spironolactone  25 mg Oral Daily  . venlafaxine XR  225 mg Oral Q breakfast   . heparin 1,300 Units/hr (11/09/15 2026)    ASSESSMENT AND PLAN: Principal Problem:   Chest pain - hx NICM - ez neg MI, ECG not acute - EF a little lower than previous values - for MV today    Chronic combined systolic and diastolic heart failure (HCC)   SOB (shortness of breath) - still w/ SOB with minimal exertion - home dose of Lasix 80 mg BID was decreased to 80 mg qd on admit, has not been given yet  today. - PAS 51 on echo - got Lasix 40 mg IV yesterday, will give again today - BP is borderline so do not think BP would tolerate 80 mg IV Lasix, can always repeat the dose prn - per pt, wt has been stable - follow BMET  Otherwise, per IM Active Problems:   Hypertension   OSA (obstructive sleep apnea)   Depression with anxiety   Obesity, morbid, BMI 40.0-49.9 ([place])   Diabetes mellitus without complication ([place])   Signed, Barrett, Rhonda , PA-C 10:53 AM 11/10/2015  See separate progress note from today.  Getting 2 day myovue and then make decision Regarding cath .    Jenkins Rouge

## 2015-11-10 NOTE — Progress Notes (Signed)
Pt refuses cpap. Rt will monitor. ?

## 2015-11-10 NOTE — Progress Notes (Signed)
PROGRESS NOTE                                                                                                                                                                                                             Patient Demographics:    [name], is a 40 y.o. female, DOB - [birth year], [id number]  Admit date - 11/09/2015   Admitting Physician Ivor Costa, MD  Outpatient Primary MD for the patient is No primary care provider on file.  LOS -   Chief Complaint  Patient presents with  . Chest Pain  . Shortness of Breath       Brief Narrative    [name] is a 40 y.o. female with medical history significant of hypertension, diabetes mellitus, IBS, depression, anxiety, OSA on CPAP, IBS, DVT not on anticoagulants, chronic combined systolic and diastolic CHF, who presents with chest pain, shortness of breath, right calf pain.  Patient states that she started having chest pain since yesterday morning at 3 AM. It is located in the left side of chest, just below rib cage, constant, pressure-like, nonradiating. It is pleuric, aggravated by deep breath. Patient has dry cough, but no fever or chills. She also has right calf tenderness since Monday. Patient has mild diarrhea due to IBS, which is at baseline. She has nausea, but no vomiting, abdominal pain. No symptoms of UTI or unilateral weakness. Of note, her dry weight is about 241, today for body weight is stable at 241 per patient.  ED Course: pt was found to have elevated d-dimer 1.79, BNP 378, negative troponin, WBC 7.2, temperature normal, tachycardia, oxygen saturation 98%, potassium 3.4, creatinine normal. Chest x-ray showed bronchitic change. Patient is placed on telemetry bed for observation. 40 mg of lasix was given by EDP.    Subjective:    [name] today has, No headache, No chest pain, No abdominal pain - No Nausea, No new weakness tingling or  numbness, ++ Cough, no SOB    Assessment  & Plan :     1. Atypical chest pain with shortness of breath. Most likely due to acute bronchitis, she does have ongoing cough for the last several days. She had a negative CT angiogram for PE. Lower extremity venous duplex -ve, stable echogram , Lexiscan per Cards.  Personally think she has acute bronchitis, will  place on azithromycin, sputum Gram stain and culture, she is already feeling a whole lot better, advance activity. If all testing negative discharge in the morning.  2. Chronic combined systolic and diastolic CHF. EF 40%, has nonischemic cardiomyopathy, currently compensated, continue Lasix, continue Coreg, continue Aldactone & ARB.  3. Essential hypertension. Stable on Coreg, Lasix and Aldactone.  4. Obstructive sleep apnea. CPAP at night.  5. Anxiety and depression. No acute issues, continue Effexor and as needed Xanax.  6. DM type II. Added sliding scale, post discharge continue Victoza.  No results found for: HGBA1C CBG (last 3)   Recent Labs  11/09/15 0736 11/09/15 2059 11/10/15 0736  GLUCAP 99 101* 116*      Code Status :  Full  Family Communication  :  None  Disposition Plan  :  DC in am likely  Consults  :  None  Procedures  :   CTA Chest -ve  TTE   Left ventricle: The cavity size was moderately dilated. Wall thickness was normal. Systolic function was severely reduced. The estimated ejection fraction was in the range of 25% to 30%. Diffuse hypokinesis. Left ventricular diastolic function parameters were normal. - Mitral valve: There was mild to moderate regurgitation. - Left atrium: The atrium was moderately dilated. - Right atrium: The atrium was mildly dilated. - Atrial septum: No defect or patent foramen ovale was identified. - Tricuspid valve: There was moderate regurgitation. - Pulmonary arteries: PA peak pressure: 51 mm Hg (S).  Vascular Ultrasound Lower extremity venous duplex has been  completed. Preliminary findings: No evidence of DVT or baker's cyst.  DVT Prophylaxis  :   Heparin GTT   Lab Results  Component Value Date   PLT 175 11/10/2015    Inpatient Medications  Scheduled Meds: . azithromycin  500 mg Oral Daily  . carvedilol  25 mg Oral BID WC  . colesevelam  625 mg Oral TID WC  . furosemide  40 mg Intravenous Once  . furosemide  80 mg Oral BID  . [START ON 11/11/2015] heparin subcutaneous  5,000 Units Subcutaneous Q8H  . losartan  100 mg Oral Daily  . regadenoson      . spironolactone  25 mg Oral Daily  . venlafaxine XR  225 mg Oral Q breakfast   Continuous Infusions:   PRN Meds:.acetaminophen, albuterol, ALPRAZolam, gabapentin, morphine injection, nitroGLYCERIN, ondansetron (ZOFRAN) IV, oxyCODONE-acetaminophen, promethazine, technetium tetrofosmin, technetium tetrofosmin  Antibiotics  :    Anti-infectives    Start     Dose/Rate Route Frequency Ordered Stop   11/09/15 0900  azithromycin (ZITHROMAX) tablet 500 mg     500 mg Oral Daily 11/09/15 0849           Objective:   Filed Vitals:   11/10/15 1205 11/10/15 1210 11/10/15 1212 11/10/15 1214  BP: 100/77 108/82 101/81 105/80  Pulse:  102 102 93  Temp:      TempSrc:      Resp:      Height:      Weight:      SpO2:        Wt Readings from Last 3 Encounters:  11/10/15 110.269 kg (243 lb 1.6 oz)  09/10/15 109.317 kg (241 lb)  07/10/15 109.317 kg (241 lb)     Intake/Output Summary (Last 24 hours) at 11/10/15 1326 Last data filed at 11/10/15 0909  Gross per 24 hour  Intake      0 ml  Output   1300 ml  Net  -1300 ml  Physical Exam  Awake Alert, Oriented X 3, No new F.N deficits, Normal affect [place].AT,PERRAL Supple Neck,No JVD, No cervical lymphadenopathy appriciated.  Symmetrical Chest wall movement, Good air movement bilaterally, CTAB RRR,No Gallops,Rubs or new Murmurs, No Parasternal Heave +ve B.Sounds, Abd Soft, No tenderness, No organomegaly appriciated, No rebound -  guarding or rigidity. No Cyanosis, Clubbing or edema, No new Rash or bruise      Data Review:    CBC  Recent Labs Lab 11/08/15 2149 11/10/15 0256  WBC 7.2 7.3  HGB 11.9* 12.2  HCT 37.4 38.9  PLT 234 175  MCV 85.6 89.4  MCH 27.2 28.0  MCHC 31.8 31.4  RDW 13.5 13.9    Chemistries   Recent Labs Lab 11/08/15 2149 11/10/15 0256  NA 137 136  K 3.4* 4.5  CL 108 104  CO2 24 22  GLUCOSE 112* 107*  BUN 9 11  CREATININE 0.84 0.84  CALCIUM 9.1 9.3  MG  --  2.1   ------------------------------------------------------------------------------------------------------------------  Recent Labs  11/09/15 0526  CHOL 160  HDL 58  LDLCALC 88  TRIG 70  CHOLHDL 2.8    No results found for: HGBA1C ------------------------------------------------------------------------------------------------------------------ No results for input(s): TSH, T4TOTAL, T3FREE, THYROIDAB in the last 72 hours.  Invalid input(s): FREET3 ------------------------------------------------------------------------------------------------------------------ No results for input(s): VITAMINB12, FOLATE, FERRITIN, TIBC, IRON, RETICCTPCT in the last 72 hours.  Coagulation profile  Recent Labs Lab 11/09/15 0526  INR 0.99     Recent Labs  11/08/15 2149  DDIMER 1.79*    Cardiac Enzymes  Recent Labs Lab 11/09/15 0526 11/09/15 0847 11/09/15 1134  TROPONINI <0.03 <0.03 <0.03   ------------------------------------------------------------------------------------------------------------------    Component Value Date/Time   BNP 378.0* 11/08/2015 2149    Micro Results No results found for this or any previous visit (from the past 240 hour(s)).  Radiology Reports Dg Chest 2 View  11/08/2015  CLINICAL DATA:  Initial evaluation for acute left-sided chest pain, shortness of breath. EXAM: CHEST  2 VIEW COMPARISON:  Prior radiograph 04/25/2015. FINDINGS: Mild cardiomegaly is stable. Mediastinal  silhouette within normal limits. Lungs are normally inflated. Mild diffuse bronchitic changes. No pulmonary edema or pleural effusion. No pneumothorax. No acute osseous abnormality. IMPRESSION: 1. Mild diffuse peribronchial thickening, which may reflect acute bronchiolitis in the setting of cough and shortness of breath. 2. No other active cardiopulmonary disease. Electronically Signed   By: Jeannine Boga M.D.   On: 11/08/2015 22:25   Ct Angio Chest Pe W Or Wo Contrast  11/09/2015  CLINICAL DATA:  Left-sided chest pain EXAM: CT ANGIOGRAPHY CHEST WITH CONTRAST TECHNIQUE: Multidetector CT imaging of the chest was performed using the standard protocol during bolus administration of intravenous contrast. Multiplanar CT image reconstructions and MIPs were obtained to evaluate the vascular anatomy. CONTRAST:  New 75 cc Isovue 370 COMPARISON:  None. FINDINGS: Mediastinum/Lymph Nodes: No pulmonary emboli or thoracic aortic dissection identified. No masses or pathologically enlarged lymph nodes identified. The heart is enlarged. Lungs/Pleura: No pneumothorax or pleural effusion. There is ground-glass opacity likely related to volume loss. Dependent atelectasis at the left base. Upper abdomen: No acute findings. Musculoskeletal: No chest wall mass or suspicious bone lesions identified. Review of the MIP images confirms the above findings. IMPRESSION: No acute pulmonary thromboembolism. Electronically Signed   By: Marybelle Killings M.D.   On: 11/09/2015 10:47    Time Spent in minutes  30   Sania Noy K M.D on 11/10/2015 at 1:26 PM  Between 7am to 7pm - Pager - [phone]  After  7pm go to [email or web address] - password [facility]  [facility] -  Office  [phone]

## 2015-11-11 ENCOUNTER — Encounter (HOSPITAL_COMMUNITY)
Admission: EM | Disposition: A | Discharge status: Home / Self Care | Payer: Self-pay | Source: Home / Self Care | Attending: Emergency Medicine

## 2015-11-11 ENCOUNTER — Other Ambulatory Visit: Payer: Self-pay

## 2015-11-11 DIAGNOSIS — R072 Precordial pain: Secondary | ICD-10-CM

## 2015-11-11 DIAGNOSIS — R9439 Abnormal result of other cardiovascular function study: Secondary | ICD-10-CM | POA: Insufficient documentation

## 2015-11-11 DIAGNOSIS — R079 Chest pain, unspecified: Secondary | ICD-10-CM | POA: Diagnosis not present

## 2015-11-11 DIAGNOSIS — O903 Peripartum cardiomyopathy: Secondary | ICD-10-CM | POA: Diagnosis not present

## 2015-11-11 DIAGNOSIS — R0789 Other chest pain: Secondary | ICD-10-CM | POA: Diagnosis not present

## 2015-11-11 DIAGNOSIS — I5042 Chronic combined systolic (congestive) and diastolic (congestive) heart failure: Secondary | ICD-10-CM | POA: Diagnosis not present

## 2015-11-11 DIAGNOSIS — I429 Cardiomyopathy, unspecified: Secondary | ICD-10-CM | POA: Diagnosis not present

## 2015-11-11 DIAGNOSIS — R931 Abnormal findings on diagnostic imaging of heart and coronary circulation: Secondary | ICD-10-CM

## 2015-11-11 DIAGNOSIS — I5023 Acute on chronic systolic (congestive) heart failure: Secondary | ICD-10-CM | POA: Diagnosis not present

## 2015-11-11 DIAGNOSIS — I110 Hypertensive heart disease with heart failure: Secondary | ICD-10-CM | POA: Diagnosis not present

## 2015-11-11 DIAGNOSIS — G4733 Obstructive sleep apnea (adult) (pediatric): Secondary | ICD-10-CM

## 2015-11-11 HISTORY — PX: CARDIAC CATHETERIZATION: SHX172

## 2015-11-11 LAB — BASIC METABOLIC PANEL
Anion gap: 5 (ref 5–15)
BUN: 13 mg/dL (ref 6–20)
CO2: 30 mmol/L (ref 22–32)
Calcium: 9.1 mg/dL (ref 8.9–10.3)
Chloride: 100 mmol/L — ABNORMAL LOW (ref 101–111)
Creatinine, Ser: 1.08 mg/dL — ABNORMAL HIGH (ref 0.44–1.00)
GFR calc Af Amer: >60 mL/min (ref >60)
GFR calc non Af Amer: >60 mL/min (ref >60)
Glucose, Bld: 99 mg/dL (ref 65–99)
Potassium: 4.3 mmol/L (ref 3.5–5.1)
Sodium: 135 mmol/L (ref 135–145)

## 2015-11-11 LAB — POCT I-STAT 3, ART BLOOD GAS (G3+)
Bicarbonate: 25 mEq/L — ABNORMAL HIGH (ref 20.0–24.0)
O2 Saturation: 90 %
TCO2: 26 mmol/L (ref 0–100)
pCO2 arterial: 43 mmHg (ref 35.0–45.0)
pH, Arterial: 7.372 (ref 7.350–7.450)
pO2, Arterial: 60 mmHg — ABNORMAL LOW (ref 80.0–100.0)

## 2015-11-11 LAB — HEMOGLOBIN A1C
Hgb A1c MFr Bld: 5.4 % (ref 4.8–5.6)
Mean Plasma Glucose: 108 mg/dL

## 2015-11-11 LAB — POCT I-STAT 3, VENOUS BLOOD GAS (G3P V)
Acid-Base Excess: 1 mmol/L (ref 0.0–2.0)
Bicarbonate: 26.7 mEq/L — ABNORMAL HIGH (ref 20.0–24.0)
O2 Saturation: 62 %
TCO2: 28 mmol/L (ref 0–100)
pCO2, Ven: 46.1 mmHg (ref 45.0–50.0)
pH, Ven: 7.37 — ABNORMAL HIGH (ref 7.250–7.300)
pO2, Ven: 33 mmHg (ref 31.0–45.0)

## 2015-11-11 LAB — CBC
HCT: 38 % (ref 36.0–46.0)
Hemoglobin: 11.8 g/dL — ABNORMAL LOW (ref 12.0–15.0)
MCH: 28 pg (ref 26.0–34.0)
MCHC: 31.1 g/dL (ref 30.0–36.0)
MCV: 90 fL (ref 78.0–100.0)
Platelets: 237 10*3/uL (ref 150–400)
RBC: 4.22 MIL/uL (ref 3.87–5.11)
RDW: 13.9 % (ref 11.5–15.5)
WBC: 7.2 10*3/uL (ref 4.0–10.5)

## 2015-11-11 LAB — POCT ACTIVATED CLOTTING TIME: Activated Clotting Time: 197 seconds

## 2015-11-11 SURGERY — RIGHT/LEFT HEART CATH AND CORONARY ANGIOGRAPHY

## 2015-11-11 MED ORDER — DIAZEPAM 5 MG PO TABS: 5.0000 mg | ORAL_TABLET | ORAL | Status: DC | PRN

## 2015-11-11 MED ORDER — VERAPAMIL HCL 2.5 MG/ML IV SOLN
INTRAVENOUS | Status: DC | PRN
  Administered 2015-11-11: 10 mL via INTRA_ARTERIAL

## 2015-11-11 MED ORDER — FUROSEMIDE 80 MG PO TABS
80.0000 mg | ORAL_TABLET | Freq: Two times a day (BID) | ORAL | Status: DC
  Administered 2015-11-12: 80 mg via ORAL
  Filled 2015-11-11: qty 1

## 2015-11-11 MED ORDER — SODIUM CHLORIDE 0.9 % IV SOLN: 250.0000 mL | INTRAVENOUS | Status: DC | PRN

## 2015-11-11 MED ORDER — SODIUM CHLORIDE 0.9 % IV SOLN
INTRAVENOUS | Status: DC
  Administered 2015-11-11: 11:00:00 via INTRAVENOUS

## 2015-11-11 MED ORDER — SODIUM CHLORIDE 0.9% FLUSH: 3.0000 mL | INTRAVENOUS | Status: DC | PRN

## 2015-11-11 MED ORDER — FENTANYL CITRATE (PF) 100 MCG/2ML IJ SOLN
INTRAMUSCULAR | Status: DC | PRN
  Administered 2015-11-11 (×3): 25 ug via INTRAVENOUS

## 2015-11-11 MED ORDER — FENTANYL CITRATE (PF) 100 MCG/2ML IJ SOLN
INTRAMUSCULAR | Status: AC
  Filled 2015-11-11: qty 2

## 2015-11-11 MED ORDER — ACETAMINOPHEN 325 MG PO TABS: 650.0000 mg | ORAL_TABLET | ORAL | Status: DC | PRN

## 2015-11-11 MED ORDER — SODIUM CHLORIDE 0.9% FLUSH
3.0000 mL | Freq: Two times a day (BID) | INTRAVENOUS | Status: DC
  Administered 2015-11-11: 3 mL via INTRAVENOUS

## 2015-11-11 MED ORDER — SODIUM CHLORIDE 0.9% FLUSH: 3.0000 mL | Freq: Two times a day (BID) | INTRAVENOUS | Status: DC

## 2015-11-11 MED ORDER — HEPARIN (PORCINE) IN NACL 2-0.9 UNIT/ML-% IJ SOLN
INTRAMUSCULAR | Status: AC
  Filled 2015-11-11: qty 1000

## 2015-11-11 MED ORDER — NITROGLYCERIN 1 MG/10 ML FOR IR/CATH LAB
INTRA_ARTERIAL | Status: AC
  Filled 2015-11-11: qty 10

## 2015-11-11 MED ORDER — VITAMIN D (ERGOCALCIFEROL) 1.25 MG (50000 UNIT) PO CAPS: 50000.0000 [IU] | ORAL_CAPSULE | ORAL | Status: DC

## 2015-11-11 MED ORDER — ONDANSETRON HCL 4 MG/2ML IJ SOLN: 4.0000 mg | Freq: Four times a day (QID) | INTRAMUSCULAR | Status: DC | PRN

## 2015-11-11 MED ORDER — IOPAMIDOL (ISOVUE-370) INJECTION 76%
INTRAVENOUS | Status: AC
  Filled 2015-11-11: qty 100

## 2015-11-11 MED ORDER — MIDAZOLAM HCL 2 MG/2ML IJ SOLN
INTRAMUSCULAR | Status: AC
  Filled 2015-11-11: qty 2

## 2015-11-11 MED ORDER — SPIRONOLACTONE 25 MG PO TABS
25.0000 mg | ORAL_TABLET | Freq: Every day | ORAL | Status: DC
  Administered 2015-11-12: 25 mg via ORAL
  Filled 2015-11-11: qty 1

## 2015-11-11 MED ORDER — SODIUM CHLORIDE 0.9 % IV SOLN: INTRAVENOUS | Status: AC

## 2015-11-11 MED ORDER — MIDAZOLAM HCL 2 MG/2ML IJ SOLN
INTRAMUSCULAR | Status: DC | PRN
  Administered 2015-11-11: 1 mg via INTRAVENOUS
  Administered 2015-11-11: 2 mg via INTRAVENOUS
  Administered 2015-11-11: 1 mg via INTRAVENOUS

## 2015-11-11 MED ORDER — LIDOCAINE HCL (PF) 1 % IJ SOLN
INTRAMUSCULAR | Status: AC
  Filled 2015-11-11: qty 30

## 2015-11-11 MED ORDER — VERAPAMIL HCL 2.5 MG/ML IV SOLN
INTRAVENOUS | Status: AC
  Filled 2015-11-11: qty 2

## 2015-11-11 MED ORDER — ENOXAPARIN SODIUM 40 MG/0.4ML ~~LOC~~ SOLN
40.0000 mg | SUBCUTANEOUS | Status: DC
  Administered 2015-11-12: 40 mg via SUBCUTANEOUS
  Filled 2015-11-11: qty 0.4

## 2015-11-11 MED ORDER — HEPARIN SODIUM (PORCINE) 1000 UNIT/ML IJ SOLN
INTRAMUSCULAR | Status: DC | PRN
  Administered 2015-11-11: 5000 [IU] via INTRAVENOUS

## 2015-11-11 MED ORDER — ALBUTEROL SULFATE HFA 108 (90 BASE) MCG/ACT IN AERS
2.0000 | INHALATION_SPRAY | Freq: Four times a day (QID) | RESPIRATORY_TRACT | Status: DC | PRN
Start: 2015-11-11 — End: 2015-12-11

## 2015-11-11 MED ORDER — CYCLOBENZAPRINE HCL 10 MG PO TABS
10.0000 mg | ORAL_TABLET | Freq: Three times a day (TID) | ORAL | Status: DC | PRN
Start: 2015-11-11 — End: 2016-01-09

## 2015-11-11 MED ORDER — HEPARIN SODIUM (PORCINE) 1000 UNIT/ML IJ SOLN
INTRAMUSCULAR | Status: AC
  Filled 2015-11-11: qty 1

## 2015-11-11 MED ORDER — MELOXICAM 15 MG PO TABS: 15.0000 mg | ORAL_TABLET | Freq: Every day | ORAL | Status: DC

## 2015-11-11 MED ORDER — LIDOCAINE HCL (PF) 1 % IJ SOLN
INTRAMUSCULAR | Status: DC | PRN
  Administered 2015-11-11: 5 mL via SUBCUTANEOUS

## 2015-11-11 MED ORDER — IOPAMIDOL (ISOVUE-370) INJECTION 76%
INTRAVENOUS | Status: DC | PRN
  Administered 2015-11-11: 80 mL via INTRA_ARTERIAL

## 2015-11-11 MED ORDER — NITROGLYCERIN 1 MG/10 ML FOR IR/CATH LAB
INTRA_ARTERIAL | Status: DC | PRN
  Administered 2015-11-11: 200 ug via INTRACORONARY

## 2015-11-11 SURGICAL SUPPLY — 15 items
CATH BALLN WEDGE 5F 110CM (CATHETERS) ×2 IMPLANT
CATH INFINITI 5 FR JL3.5 (CATHETERS) ×2 IMPLANT
CATH INFINITI 5FR ANG PIGTAIL (CATHETERS) ×2 IMPLANT
CATH INFINITI JR4 5F (CATHETERS) IMPLANT
CATH OPTITORQUE TIG 4.0 5F (CATHETERS) ×2 IMPLANT
DEVICE RAD COMP TR BAND LRG (VASCULAR PRODUCTS) ×2 IMPLANT
GLIDESHEATH SLEND SS 6F .021 (SHEATH) ×2 IMPLANT
KIT HEART LEFT (KITS) ×2 IMPLANT
KIT HEART RIGHT NAMIC (KITS) ×2 IMPLANT
PACK CARDIAC CATHETERIZATION (CUSTOM PROCEDURE TRAY) ×2 IMPLANT
SHEATH FAST CATH BRACH 5F 5CM (SHEATH) ×2 IMPLANT
SYR MEDRAD MARK V 150ML (SYRINGE) ×2 IMPLANT
TRANSDUCER W/STOPCOCK (MISCELLANEOUS) ×4 IMPLANT
TUBING CIL FLEX 10 FLL-RA (TUBING) ×2 IMPLANT
WIRE SAFE-T 1.5MM-J .035X260CM (WIRE) ×2 IMPLANT

## 2015-11-11 NOTE — Progress Notes (Signed)
PROGRESS NOTE                                                                                                                                                                                                             Patient Demographics:    [name], is a 40 y.o. female, DOB - [birth year], [id number]  Admit date - 11/09/2015   Admitting Physician Ivor Costa, MD  Outpatient Primary MD for the patient is No primary care provider on file.  LOS -   Chief Complaint  Patient presents with  . Chest Pain  . Shortness of Breath       Brief Narrative    [name] is a 40 y.o. female with medical history significant of hypertension, diabetes mellitus, IBS, depression, anxiety, OSA on CPAP, IBS, DVT not on anticoagulants, chronic combined systolic and diastolic CHF, who presents with chest pain, shortness of breath, right calf pain.  Patient states that she started having chest pain since yesterday morning at 3 AM. It is located in the left side of chest, just below rib cage, constant, pressure-like, nonradiating. It is pleuric, aggravated by deep breath. Patient has dry cough, but no fever or chills. She also has right calf tenderness since Monday. Patient has mild diarrhea due to IBS, which is at baseline. She has nausea, but no vomiting, abdominal pain. No symptoms of UTI or unilateral weakness. Of note, her dry weight is about 241, today for body weight is stable at 241 per patient.  ED Course: pt was found to have elevated d-dimer 1.79, BNP 378, negative troponin, WBC 7.2, temperature normal, tachycardia, oxygen saturation 98%, potassium 3.4, creatinine normal. Chest x-ray showed bronchitic change. Patient is placed on telemetry bed for observation. 40 mg of lasix was given by EDP.    Subjective:    [name] today has, No headache, No chest pain, No abdominal pain - No Nausea, No new weakness tingling or  numbness, ++ Cough, no SOB    Assessment  & Plan :     1. Atypical chest pain with shortness of breath. Most likely due to acute bronchitis, she does have ongoing cough for the last several days. She had a negative CT angiogram for PE. Lower extremity venous duplex -ve, stable echogram but Lexiscan high risk, due for left and right heart cath by  cardiology.  Personally think she has acute bronchitis, she is currently on azithromycin, sputum Gram stain and culture, she is already feeling a whole lot better, advance activity. If all testing negative discharge in the morning.  2. Chronic combined systolic and diastolic CHF, EF [id number], had history of nonischemic cardiomyopathy, currently compensated, continue Lasix, continue Coreg, continue Aldactone & ARB.  3. Essential hypertension. Stable on Coreg, Lasix and Aldactone.  4. Obstructive sleep apnea. CPAP at night.  5. Anxiety and depression. No acute issues, continue Effexor and as needed Xanax.  6. DM type II. Added sliding scale, post discharge continue Victoza.  No results found for: HGBA1C CBG (last 3)   Recent Labs  11/09/15 0736 11/09/15 2059 11/10/15 0736  GLUCAP 99 101* 116*      Code Status :  Full  Family Communication  :  None  Disposition Plan  :  Home after cardiology workup was done  Consults  :  Cardiology  Procedures  :   Pending left and right heart cath.   CTA Chest -ve  TTE   Left ventricle: The cavity size was moderately dilated. Wall thickness was normal. Systolic function was severely reduced. The estimated ejection fraction was in the range of 25% to 30%. Diffuse hypokinesis. Left ventricular diastolic function parameters were normal. - Mitral valve: There was mild to moderate regurgitation. - Left atrium: The atrium was moderately dilated. - Right atrium: The atrium was mildly dilated. - Atrial septum: No defect or patent foramen ovale was identified. - Tricuspid valve: There was moderate  regurgitation. - Pulmonary arteries: PA peak pressure: 51 mm Hg (S).  Vascular Ultrasound Lower extremity venous duplex has been completed. Preliminary findings: No evidence of DVT or baker's cyst.  Lexiscan. High risk.  DVT Prophylaxis  :   Heparin GTT   Lab Results  Component Value Date   PLT 237 11/11/2015    Inpatient Medications  Scheduled Meds: . azithromycin  500 mg Oral Daily  . carvedilol  25 mg Oral BID WC  . colesevelam  625 mg Oral TID WC  . [START ON 11/12/2015] furosemide  80 mg Oral BID  . heparin subcutaneous  5,000 Units Subcutaneous Q8H  . losartan  100 mg Oral Daily  . spironolactone  25 mg Oral Daily  . venlafaxine XR  225 mg Oral Q breakfast   Continuous Infusions:   PRN Meds:.acetaminophen, albuterol, ALPRAZolam, gabapentin, morphine injection, nitroGLYCERIN, ondansetron (ZOFRAN) IV, oxyCODONE-acetaminophen, promethazine  Antibiotics  :    Anti-infectives    Start     Dose/Rate Route Frequency Ordered Stop   11/09/15 0900  azithromycin (ZITHROMAX) tablet 500 mg     500 mg Oral Daily 11/09/15 0849           Objective:   Filed Vitals:   11/11/15 0033 11/11/15 0042 11/11/15 0500 11/11/15 0700  BP: 118/72 96/70 107/54 101/61  Pulse:   74 76  Temp:   97.7 F (36.5 C) 98.4 F (36.9 C)  TempSrc:    Oral  Resp:   15 16  Height:      Weight:   109.68 kg (241 lb 12.8 oz)   SpO2:   98% 97%    Wt Readings from Last 3 Encounters:  11/11/15 109.68 kg (241 lb 12.8 oz)  09/10/15 109.317 kg (241 lb)  07/10/15 109.317 kg (241 lb)     Intake/Output Summary (Last 24 hours) at 11/11/15 1050 Last data filed at 11/11/15 0500  Gross per 24 hour  Intake  535.78 ml  Output   2100 ml  Net -1564.22 ml     Physical Exam  Awake Alert, Oriented X 3, No new F.N deficits, Normal affect [place].AT,PERRAL Supple Neck,No JVD, No cervical lymphadenopathy appriciated.  Symmetrical Chest wall movement, Good air movement bilaterally, CTAB RRR,No Gallops,Rubs or  new Murmurs, No Parasternal Heave +ve B.Sounds, Abd Soft, No tenderness, No organomegaly appriciated, No rebound - guarding or rigidity. No Cyanosis, Clubbing or edema, No new Rash or bruise      Data Review:    CBC  Recent Labs Lab 11/08/15 2149 11/10/15 0256 11/11/15 0538  WBC 7.2 7.3 7.2  HGB 11.9* 12.2 11.8*  HCT 37.4 38.9 38.0  PLT 234 175 237  MCV 85.6 89.4 90.0  MCH 27.2 28.0 28.0  MCHC 31.8 31.4 31.1  RDW 13.5 13.9 13.9    Chemistries   Recent Labs Lab 11/08/15 2149 11/10/15 0256 11/11/15 0538  NA 137 136 135  K 3.4* 4.5 4.3  CL 108 104 100*  CO2 24 22 30   GLUCOSE 112* 107* 99  BUN 9 11 13   CREATININE 0.84 0.84 1.08*  CALCIUM 9.1 9.3 9.1  MG  --  2.1  --    ------------------------------------------------------------------------------------------------------------------  Recent Labs  11/09/15 0526  CHOL 160  HDL 58  LDLCALC 88  TRIG 70  CHOLHDL 2.8    No results found for: HGBA1C ------------------------------------------------------------------------------------------------------------------ No results for input(s): TSH, T4TOTAL, T3FREE, THYROIDAB in the last 72 hours.  Invalid input(s): FREET3 ------------------------------------------------------------------------------------------------------------------ No results for input(s): VITAMINB12, FOLATE, FERRITIN, TIBC, IRON, RETICCTPCT in the last 72 hours.  Coagulation profile  Recent Labs Lab 11/09/15 0526  INR 0.99     Recent Labs  11/08/15 2149  DDIMER 1.79*    Cardiac Enzymes  Recent Labs Lab 11/09/15 0526 11/09/15 0847 11/09/15 1134  TROPONINI <0.03 <0.03 <0.03   ------------------------------------------------------------------------------------------------------------------    Component Value Date/Time   BNP 378.0* 11/08/2015 2149    Micro Results No results found for this or any previous visit (from the past 240 hour(s)).  Radiology Reports Dg Chest 2  View  11/08/2015  CLINICAL DATA:  Initial evaluation for acute left-sided chest pain, shortness of breath. EXAM: CHEST  2 VIEW COMPARISON:  Prior radiograph 04/25/2015. FINDINGS: Mild cardiomegaly is stable. Mediastinal silhouette within normal limits. Lungs are normally inflated. Mild diffuse bronchitic changes. No pulmonary edema or pleural effusion. No pneumothorax. No acute osseous abnormality. IMPRESSION: 1. Mild diffuse peribronchial thickening, which may reflect acute bronchiolitis in the setting of cough and shortness of breath. 2. No other active cardiopulmonary disease. Electronically Signed   By: Jeannine Boga M.D.   On: 11/08/2015 22:25   Ct Angio Chest Pe W Or Wo Contrast  11/09/2015  CLINICAL DATA:  Left-sided chest pain EXAM: CT ANGIOGRAPHY CHEST WITH CONTRAST TECHNIQUE: Multidetector CT imaging of the chest was performed using the standard protocol during bolus administration of intravenous contrast. Multiplanar CT image reconstructions and MIPs were obtained to evaluate the vascular anatomy. CONTRAST:  New 75 cc Isovue 370 COMPARISON:  None. FINDINGS: Mediastinum/Lymph Nodes: No pulmonary emboli or thoracic aortic dissection identified. No masses or pathologically enlarged lymph nodes identified. The heart is enlarged. Lungs/Pleura: No pneumothorax or pleural effusion. There is ground-glass opacity likely related to volume loss. Dependent atelectasis at the left base. Upper abdomen: No acute findings. Musculoskeletal: No chest wall mass or suspicious bone lesions identified. Review of the MIP images confirms the above findings. IMPRESSION: No acute pulmonary thromboembolism. Electronically Signed   By:  Marybelle Killings M.D.   On: 11/09/2015 10:47   Nm Myocar Multi W/spect W/wall Motion / Ef  11/10/2015  CLINICAL DATA:  40 year old female with a history of chest pain. Cardiovascular risk factors include hypertension, diabetes, obesity. EXAM: MYOCARDIAL IMAGING WITH SPECT (REST AND  PHARMACOLOGIC-STRESS) GATED LEFT VENTRICULAR WALL MOTION STUDY LEFT VENTRICULAR EJECTION FRACTION TECHNIQUE: Standard myocardial SPECT imaging was performed after resting intravenous injection of 10 mCi Tc-65m tetrofosmin. Subsequently, intravenous infusion of Lexiscan was performed under the supervision of the Cardiology staff. At peak effect of the drug, 30 mCi Tc-51m tetrofosmin was injected intravenously and standard myocardial SPECT imaging was performed. Quantitative gated imaging was also performed to evaluate left ventricular wall motion, and estimate left ventricular ejection fraction. COMPARISON:  None. FINDINGS: Perfusion: Moderate-sized defect of moderate severity involving the lateral wall on perfusion images extending from mid ventricle to the base. In addition, perfusion images demonstrate evidence of transient ischemic dilation. Wall Motion: Global hypokinesia, with paradoxical motion of the septal wall towards the base. Left Ventricular Ejection Fraction: 34 % End diastolic volume [id number] ml End systolic volume 96 ml IMPRESSION: 1. Region of reversible ischemia of moderate size and moderate severity involving the lateral wall from the mid ventricle to the base. There is evidence of transient ischemic dilation (TID) on perfusion series. 2. Global hypokinesia at with paradoxical motion at the septal wall towards the base. 3. Left ventricular ejection fraction 34% 4. Non invasive risk stratification*: High risk, categorized by the low ejection fraction, severe abnormal diastolic ventricular volume, with reversible defect of the lateral wall. *2012 Appropriate Use Criteria for Coronary Revascularization Focused Update: J Am Coll Cardiol. [id number]. http://content.airportbarriers.com.aspx?articleid=1201161 Electronically Signed   By: Corrie Mckusick D.O.   On: 11/10/2015 14:59    Time Spent in minutes  30   SINGH,PRASHANT K M.D on 11/11/2015 at 10:50 AM  Between 7am to 7pm - Pager -  [phone]  After 7pm go to [email or web address] - password [facility]  [facility] -  Office  [phone]

## 2015-11-11 NOTE — Progress Notes (Addendum)
CPAP set up and patient placed on auto setting via nasal mask. Patient tolerating well at this time. Rt will monitor as needed.

## 2015-11-11 NOTE — Interval H&P Note (Signed)
Cath Lab Visit (complete for each Cath Lab visit)  Clinical Evaluation Leading to the Procedure:   ACS: No.  Non-ACS:    Anginal Classification: CCS III  Anti-ischemic medical therapy: Minimal Therapy (1 class of medications)  Non-Invasive Test Results: High-risk stress test findings: cardiac mortality >3%/year  Prior CABG: No previous CABG      History and Physical Interval Note:  11/11/2015 3:19 PM  [name]  has presented today for surgery, with the diagnosis of cp  The various methods of treatment have been discussed with the patient and family. After consideration of risks, benefits and other options for treatment, the patient has consented to  Procedure(s): Right/Left Heart Cath and Coronary Angiography (N/A) as a surgical intervention .  The patient's history has been reviewed, patient examined, no change in status, stable for surgery.  I have reviewed the patient's chart and labs.  Questions were answered to the patient's satisfaction.     Shelva Majestic

## 2015-11-11 NOTE — Progress Notes (Signed)
Site area: Right brachial a 5 french venous brachial sheath was removed  Site Prior to Removal:  Level 0  Pressure Applied For 15 MINUTES    Minutes Beginning at 1645p  Manual:   Yes.    Patient Status During Pull:  stable  Post Pull Groin Site:  Level 0  Post Pull Instructions Given:  Yes.    Post Pull Pulses Present:  Yes.    Dressing Applied:  Yes.    Comments:  VS remain stable during sheath pull.

## 2015-11-11 NOTE — H&P (View-Only) (Signed)
Patient Name: [name] Date of Encounter: 11/11/2015  Hospital Problem List     Principal Problem:   Chest pain Active Problems:   Hypertension   OSA (obstructive sleep apnea)   Depression with anxiety   Obesity, morbid, BMI 40.0-49.9 (HCC)   Chronic combined systolic and diastolic heart failure (HCC)   SOB (shortness of breath)   Diabetes mellitus without complication (HCC)   Acute on chronic congestive heart failure ([place])    Subjective   Feels ok this morning. Did have episode of chest pain last night relieved by SL nitro.  Inpatient Medications    . azithromycin  500 mg Oral Daily  . carvedilol  25 mg Oral BID WC  . colesevelam  625 mg Oral TID WC  . furosemide  80 mg Oral BID  . heparin subcutaneous  5,000 Units Subcutaneous Q8H  . losartan  100 mg Oral Daily  . spironolactone  25 mg Oral Daily  . venlafaxine XR  225 mg Oral Q breakfast    Vital Signs    Filed Vitals:   11/11/15 0022 11/11/15 0033 11/11/15 0042 11/11/15 0500  BP: 98/45 118/72 96/70 107/54  Pulse: 79   74  Temp: 98 F (36.7 C)   97.7 F (36.5 C)  TempSrc:      Resp: 17   15  Height:      Weight:    241 lb 12.8 oz (109.68 kg)  SpO2: 100%   98%    Intake/Output Summary (Last 24 hours) at 11/11/15 0755 Last data filed at 11/11/15 0500  Gross per 24 hour  Intake 535.78 ml  Output   2100 ml  Net -1564.22 ml   Filed Weights   11/09/15 0608 11/10/15 0500 11/11/15 0500  Weight: 243 lb 6.4 oz (110.406 kg) 243 lb 1.6 oz (110.269 kg) 241 lb 12.8 oz (109.68 kg)    Physical Exam    General: Pleasant obese female, NAD. Neuro: Alert and oriented X 3. Moves all extremities spontaneously. Psych: Normal affect. HEENT:  Normal  Neck: Supple without bruits or JVD. Lungs:  Resp regular and unlabored, CTA. Heart: RRR no s3, s4, or murmurs. Abdomen: Soft, non-tender, non-distended, BS + x 4.  Extremities: No clubbing, cyanosis or edema. DP/PT/Radials 2+ and equal bilaterally.  Labs      CBC  Recent Labs  11/10/15 0256 11/11/15 0538  WBC 7.3 7.2  HGB 12.2 11.8*  HCT 38.9 38.0  MCV 89.4 90.0  PLT 175 [id number]   Basic Metabolic Panel  Recent Labs  11/10/15 0256 11/11/15 0538  NA 136 135  K 4.5 4.3  CL 104 100*  CO2 22 30  GLUCOSE 107* 99  BUN 11 13  CREATININE 0.84 1.08*  CALCIUM 9.3 9.1  MG 2.1  --    Cardiac Enzymes  Recent Labs  11/09/15 0526 11/09/15 0847 11/09/15 1134  TROPONINI <0.03 <0.03 <0.03   BNP Invalid input(s): POCBNP D-Dimer  Recent Labs  11/08/15 2149  DDIMER 1.79*   Fasting Lipid Panel  Recent Labs  11/09/15 0526  CHOL 160  HDL 58  LDLCALC 88  TRIG 70  CHOLHDL 2.8    Telemetry    SR with PVCs  ECG    NSR, atrial enlargement with left axis deviation  Radiology    Ct Angio Chest Pe W Or Wo Contrast  11/09/2015  CLINICAL DATA:  Left-sided chest pain EXAM: CT ANGIOGRAPHY CHEST WITH CONTRAST TECHNIQUE: Multidetector CT imaging of the chest was performed using  the standard protocol during bolus administration of intravenous contrast. Multiplanar CT image reconstructions and MIPs were obtained to evaluate the vascular anatomy. CONTRAST:  New 75 cc Isovue 370 COMPARISON:  None. FINDINGS: Mediastinum/Lymph Nodes: No pulmonary emboli or thoracic aortic dissection identified. No masses or pathologically enlarged lymph nodes identified. The heart is enlarged. Lungs/Pleura: No pneumothorax or pleural effusion. There is ground-glass opacity likely related to volume loss. Dependent atelectasis at the left base. Upper abdomen: No acute findings. Musculoskeletal: No chest wall mass or suspicious bone lesions identified. Review of the MIP images confirms the above findings. IMPRESSION: No acute pulmonary thromboembolism. Electronically Signed   By: Marybelle Killings M.D.   On: 11/09/2015 10:47   Nm Myocar Multi W/spect W/wall Motion / Ef  11/10/2015  CLINICAL DATA:  40 year old female with a history of chest pain. Cardiovascular risk  factors include hypertension, diabetes, obesity. EXAM: MYOCARDIAL IMAGING WITH SPECT (REST AND PHARMACOLOGIC-STRESS) GATED LEFT VENTRICULAR WALL MOTION STUDY LEFT VENTRICULAR EJECTION FRACTION TECHNIQUE: Standard myocardial SPECT imaging was performed after resting intravenous injection of 10 mCi Tc-73m tetrofosmin. Subsequently, intravenous infusion of Lexiscan was performed under the supervision of the Cardiology staff. At peak effect of the drug, 30 mCi Tc-3m tetrofosmin was injected intravenously and standard myocardial SPECT imaging was performed. Quantitative gated imaging was also performed to evaluate left ventricular wall motion, and estimate left ventricular ejection fraction. COMPARISON:  None. FINDINGS: Perfusion: Moderate-sized defect of moderate severity involving the lateral wall on perfusion images extending from mid ventricle to the base. In addition, perfusion images demonstrate evidence of transient ischemic dilation. Wall Motion: Global hypokinesia, with paradoxical motion of the septal wall towards the base. Left Ventricular Ejection Fraction: 34 % End diastolic volume Q000111Q ml End systolic volume 96 ml IMPRESSION: 1. Region of reversible ischemia of moderate size and moderate severity involving the lateral wall from the mid ventricle to the base. There is evidence of transient ischemic dilation (TID) on perfusion series. 2. Global hypokinesia at with paradoxical motion at the septal wall towards the base. 3. Left ventricular ejection fraction 34% 4. Non invasive risk stratification*: High risk, categorized by the low ejection fraction, severe abnormal diastolic ventricular volume, with reversible defect of the lateral wall. *2012 Appropriate Use Criteria for Coronary Revascularization Focused Update: J Am Coll Cardiol. N6492421. http://content.[email or web address]?[email or web address] Electronically Signed   By: Corrie Mckusick D.O.  On: 11/10/2015 14:59   Echo: 11/10/2015  Study  Conclusions  - Left ventricle: The cavity size was moderately dilated. Wall  thickness was normal. Systolic function was severely reduced. The  estimated ejection fraction was in the range of 25% to 30%.  Diffuse hypokinesis. Left ventricular diastolic function  parameters were normal. - Mitral valve: There was mild to moderate regurgitation. - Left atrium: The atrium was moderately dilated. - Right atrium: The atrium was mildly dilated. - Atrial septum: No defect or patent foramen ovale was identified. - Tricuspid valve: There was moderate regurgitation. - Pulmonary arteries: PA peak pressure: 51 mm Hg (S).  Assessment & Plan    40 y.o. obese black female with PMH of HTN/OSA/depression/morbid obesity and DM II admitted with SSCP pain 48hrs. Hurts to take deep breath. No fever sputum, Feels wheezy. Pain not positional. Can be at rest and worse laying down. She indicates allergy to contrast "Shut my kidney down" but this was in setting of abdominal sepsis , 2 months hospital stay and Vancomycin. Had contrast for heart cath in 2015 with no kidney issues. 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 EF by last echo 35-40%.  1. Chest pain: Had a CTA on 6/17 with no findings of acute PE, was seen over the weekend but cardiology and currently undergoing to a 2 day myoview. 2D echo completed and showed slightly reduced EF from 35-40% with no WMA and G2DD to 25-30% with diffuse hypokinesis and PA peak pressure 51 mmHg.  --Does report having an episode of left sided chest pain last night, and given nitro with relief. --troponins cycled and negative x3, currently on heparin drip --BNP on admission 378, has been transitioned to PO lasix with 2.1L UOP yesterday. --Myoview testing yesterday with initial results reading high risk, I would think that with these results and changes noted on 2D echo, we may need to proceed to with a LHC. I have discussed the possibility of heart cath with the patient. Cr is  slightly elevated this morning. Will need IV hydration prior to cath.   2. HTN: Some soft Bp's noted, but generally controlled on current medications  3. DM II: Per primary  Signed, Reino Bellis NP-C Pager [phone] As above, patient seen and examined. The patient denies dyspnea or chest pain this morning. Echocardiogram shows worsening LV function. Nuclear study also abnormal and suggestive of ischemia. Patient will require cardiac catheterization. Patient will require right and left cardiac catheterization. The risks and benefits were discussed and she agrees to proceed. Hold Lasix and spironolactone prior to procedure. Resume after. Would change ARB to entresto following DC. Will need follow-up echocardiogram 3 months after medications fluid titrated. If ejection fraction less than 35% would require ICD. Kirk Ruths

## 2015-11-11 NOTE — Progress Notes (Signed)
The client had chest pressure tonight and she requested to have Percocet, "like I had last night for pain it worked and I went to sleep." The pain didn't get any better and was still a 8 out of 10. I gave nitro x 1 and the pain went to 4, got an EKG, BP went down so I gave no more nitro after the first dose. The client was asleep within 30 minutes and has rested well all night.

## 2015-11-11 NOTE — Progress Notes (Signed)
Patient Name: [name] Date of Encounter: 11/11/2015  Hospital Problem List     Principal Problem:   Chest pain Active Problems:   Hypertension   OSA (obstructive sleep apnea)   Depression with anxiety   Obesity, morbid, BMI 40.0-49.9 (HCC)   Chronic combined systolic and diastolic heart failure (HCC)   SOB (shortness of breath)   Diabetes mellitus without complication (HCC)   Acute on chronic congestive heart failure ([place])    Subjective   Feels ok this morning. Did have episode of chest pain last night relieved by SL nitro.  Inpatient Medications    . azithromycin  500 mg Oral Daily  . carvedilol  25 mg Oral BID WC  . colesevelam  625 mg Oral TID WC  . furosemide  80 mg Oral BID  . heparin subcutaneous  5,000 Units Subcutaneous Q8H  . losartan  100 mg Oral Daily  . spironolactone  25 mg Oral Daily  . venlafaxine XR  225 mg Oral Q breakfast    Vital Signs    Filed Vitals:   11/11/15 0022 11/11/15 0033 11/11/15 0042 11/11/15 0500  BP: 98/45 118/72 96/70 107/54  Pulse: 79   74  Temp: 98 F (36.7 C)   97.7 F (36.5 C)  TempSrc:      Resp: 17   15  Height:      Weight:    241 lb 12.8 oz (109.68 kg)  SpO2: 100%   98%    Intake/Output Summary (Last 24 hours) at 11/11/15 0755 Last data filed at 11/11/15 0500  Gross per 24 hour  Intake 535.78 ml  Output   2100 ml  Net -1564.22 ml   Filed Weights   11/09/15 0608 11/10/15 0500 11/11/15 0500  Weight: 243 lb 6.4 oz (110.406 kg) 243 lb 1.6 oz (110.269 kg) 241 lb 12.8 oz (109.68 kg)    Physical Exam    General: Pleasant obese female, NAD. Neuro: Alert and oriented X 3. Moves all extremities spontaneously. Psych: Normal affect. HEENT:  Normal  Neck: Supple without bruits or JVD. Lungs:  Resp regular and unlabored, CTA. Heart: RRR no s3, s4, or murmurs. Abdomen: Soft, non-tender, non-distended, BS + x 4.  Extremities: No clubbing, cyanosis or edema. DP/PT/Radials 2+ and equal bilaterally.  Labs      CBC  Recent Labs  11/10/15 0256 11/11/15 0538  WBC 7.3 7.2  HGB 12.2 11.8*  HCT 38.9 38.0  MCV 89.4 90.0  PLT 175 [id number]   Basic Metabolic Panel  Recent Labs  11/10/15 0256 11/11/15 0538  NA 136 135  K 4.5 4.3  CL 104 100*  CO2 22 30  GLUCOSE 107* 99  BUN 11 13  CREATININE 0.84 1.08*  CALCIUM 9.3 9.1  MG 2.1  --    Cardiac Enzymes  Recent Labs  11/09/15 0526 11/09/15 0847 11/09/15 1134  TROPONINI <0.03 <0.03 <0.03   BNP Invalid input(s): POCBNP D-Dimer  Recent Labs  11/08/15 2149  DDIMER 1.79*   Fasting Lipid Panel  Recent Labs  11/09/15 0526  CHOL 160  HDL 58  LDLCALC 88  TRIG 70  CHOLHDL 2.8    Telemetry    SR with PVCs  ECG    NSR, atrial enlargement with left axis deviation  Radiology    Ct Angio Chest Pe W Or Wo Contrast  11/09/2015  CLINICAL DATA:  Left-sided chest pain EXAM: CT ANGIOGRAPHY CHEST WITH CONTRAST TECHNIQUE: Multidetector CT imaging of the chest was performed using  the standard protocol during bolus administration of intravenous contrast. Multiplanar CT image reconstructions and MIPs were obtained to evaluate the vascular anatomy. CONTRAST:  New 75 cc Isovue 370 COMPARISON:  None. FINDINGS: Mediastinum/Lymph Nodes: No pulmonary emboli or thoracic aortic dissection identified. No masses or pathologically enlarged lymph nodes identified. The heart is enlarged. Lungs/Pleura: No pneumothorax or pleural effusion. There is ground-glass opacity likely related to volume loss. Dependent atelectasis at the left base. Upper abdomen: No acute findings. Musculoskeletal: No chest wall mass or suspicious bone lesions identified. Review of the MIP images confirms the above findings. IMPRESSION: No acute pulmonary thromboembolism. Electronically Signed   By: Marybelle Killings M.D.   On: 11/09/2015 10:47   Nm Myocar Multi W/spect W/wall Motion / Ef  11/10/2015  CLINICAL DATA:  40 year old female with a history of chest pain. Cardiovascular risk  factors include hypertension, diabetes, obesity. EXAM: MYOCARDIAL IMAGING WITH SPECT (REST AND PHARMACOLOGIC-STRESS) GATED LEFT VENTRICULAR WALL MOTION STUDY LEFT VENTRICULAR EJECTION FRACTION TECHNIQUE: Standard myocardial SPECT imaging was performed after resting intravenous injection of 10 mCi Tc-79m tetrofosmin. Subsequently, intravenous infusion of Lexiscan was performed under the supervision of the Cardiology staff. At peak effect of the drug, 30 mCi Tc-85m tetrofosmin was injected intravenously and standard myocardial SPECT imaging was performed. Quantitative gated imaging was also performed to evaluate left ventricular wall motion, and estimate left ventricular ejection fraction. COMPARISON:  None. FINDINGS: Perfusion: Moderate-sized defect of moderate severity involving the lateral wall on perfusion images extending from mid ventricle to the base. In addition, perfusion images demonstrate evidence of transient ischemic dilation. Wall Motion: Global hypokinesia, with paradoxical motion of the septal wall towards the base. Left Ventricular Ejection Fraction: 34 % End diastolic volume Q000111Q ml End systolic volume 96 ml IMPRESSION: 1. Region of reversible ischemia of moderate size and moderate severity involving the lateral wall from the mid ventricle to the base. There is evidence of transient ischemic dilation (TID) on perfusion series. 2. Global hypokinesia at with paradoxical motion at the septal wall towards the base. 3. Left ventricular ejection fraction 34% 4. Non invasive risk stratification*: High risk, categorized by the low ejection fraction, severe abnormal diastolic ventricular volume, with reversible defect of the lateral wall. *2012 Appropriate Use Criteria for Coronary Revascularization Focused Update: J Am Coll Cardiol. N6492421. http://[email or web address]?[email or web address] Electronically Signed   By: Corrie Mckusick D.O.  On: 11/10/2015 14:59   Echo: 11/10/2015  Study  Conclusions  - Left ventricle: The cavity size was moderately dilated. Wall  thickness was normal. Systolic function was severely reduced. The  estimated ejection fraction was in the range of 25% to 30%.  Diffuse hypokinesis. Left ventricular diastolic function  parameters were normal. - Mitral valve: There was mild to moderate regurgitation. - Left atrium: The atrium was moderately dilated. - Right atrium: The atrium was mildly dilated. - Atrial septum: No defect or patent foramen ovale was identified. - Tricuspid valve: There was moderate regurgitation. - Pulmonary arteries: PA peak pressure: 51 mm Hg (S).  Assessment & Plan    40 y.o. obese black female with PMH of HTN/OSA/depression/morbid obesity and DM II admitted with SSCP pain 48hrs. Hurts to take deep breath. No fever sputum, Feels wheezy. Pain not positional. Can be at rest and worse laying down. She indicates allergy to contrast "Shut my kidney down" but this was in setting of abdominal sepsis , 2 months hospital stay and Vancomycin. Had contrast for heart cath in 2015 with no kidney issues. 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 EF by last echo 35-40%.  1. Chest pain: Had a CTA on 6/17 with no findings of acute PE, was seen over the weekend but cardiology and currently undergoing to a 2 day myoview. 2D echo completed and showed slightly reduced EF from 35-40% with no WMA and G2DD to 25-30% with diffuse hypokinesis and PA peak pressure 51 mmHg.  --Does report having an episode of left sided chest pain last night, and given nitro with relief. --troponins cycled and negative x3, currently on heparin drip --BNP on admission 378, has been transitioned to PO lasix with 2.1L UOP yesterday. --Myoview testing yesterday with initial results reading high risk, I would think that with these results and changes noted on 2D echo, we may need to proceed to with a LHC. I have discussed the possibility of heart cath with the patient. Cr is  slightly elevated this morning. Will need IV hydration prior to cath.   2. HTN: Some soft Bp's noted, but generally controlled on current medications  3. DM II: Per primary  Signed, Reino Bellis NP-C Pager [phone] As above, patient seen and examined. The patient denies dyspnea or chest pain this morning. Echocardiogram shows worsening LV function. Nuclear study also abnormal and suggestive of ischemia. Patient will require cardiac catheterization. Patient will require right and left cardiac catheterization. The risks and benefits were discussed and she agrees to proceed. Hold Lasix and spironolactone prior to procedure. Resume after. Would change ARB to entresto following DC. Will need follow-up echocardiogram 3 months after medications fluid titrated. If ejection fraction less than 35% would require ICD. Kirk Ruths

## 2015-11-12 ENCOUNTER — Ambulatory Visit: Payer: 59 | Admitting: Family Medicine

## 2015-11-12 ENCOUNTER — Encounter (HOSPITAL_COMMUNITY): Payer: Self-pay | Admitting: Cardiovascular Disease

## 2015-11-12 ENCOUNTER — Telehealth: Payer: Self-pay | Admitting: Interventional Cardiology

## 2015-11-12 DIAGNOSIS — R072 Precordial pain: Secondary | ICD-10-CM | POA: Diagnosis not present

## 2015-11-12 DIAGNOSIS — I5023 Acute on chronic systolic (congestive) heart failure: Secondary | ICD-10-CM | POA: Diagnosis not present

## 2015-11-12 DIAGNOSIS — I5042 Chronic combined systolic (congestive) and diastolic (congestive) heart failure: Secondary | ICD-10-CM | POA: Diagnosis not present

## 2015-11-12 LAB — BASIC METABOLIC PANEL
Anion gap: 6 (ref 5–15)
BUN: 12 mg/dL (ref 6–20)
CO2: 26 mmol/L (ref 22–32)
Calcium: 8.6 mg/dL — ABNORMAL LOW (ref 8.9–10.3)
Chloride: 105 mmol/L (ref 101–111)
Creatinine, Ser: 0.94 mg/dL (ref 0.44–1.00)
GFR calc Af Amer: >60 mL/min (ref >60)
GFR calc non Af Amer: >60 mL/min (ref >60)
Glucose, Bld: 116 mg/dL — ABNORMAL HIGH (ref 65–99)
Potassium: 3.8 mmol/L (ref 3.5–5.1)
Sodium: 137 mmol/L (ref 135–145)

## 2015-11-12 LAB — CBC
HCT: 36.8 % (ref 36.0–46.0)
Hemoglobin: 11.3 g/dL — ABNORMAL LOW (ref 12.0–15.0)
MCH: 27.5 pg (ref 26.0–34.0)
MCHC: 30.7 g/dL (ref 30.0–36.0)
MCV: 89.5 fL (ref 78.0–100.0)
Platelets: 207 10*3/uL (ref 150–400)
RBC: 4.11 MIL/uL (ref 3.87–5.11)
RDW: 14 % (ref 11.5–15.5)
WBC: 6.6 10*3/uL (ref 4.0–10.5)

## 2015-11-12 NOTE — Plan of Care (Signed)
Problem: Education: Goal: Knowledge of [facility] Education information/materials will improve Outcome: Completed/Met Date Met:  11/12/15 Pt received education regarding tests, procedures, medications, and available resources throughout entire admission   Problem: Pain Managment: Goal: General experience of comfort will improve Outcome: Completed/Met Date Met:  11/12/15 General comfort has improved  Problem: Fluid Volume: Goal: Ability to maintain a balanced intake and output will improve Outcome: Completed/Met Date Met:  11/12/15 Pt maintain adequate intake and output   Problem: Phase II Progression Outcomes Goal: Discharge plan in place and appropriate Outcome: Completed/Met Date Met:  11/12/15 Pt will be discharged home today  Goal: Pain controlled with appropriate interventions Outcome: Completed/Met Date Met:  11/12/15 Pt pain remains controlled with p.o. medications  Goal: Ambulates up to 600 ft. in hall x 1 Outcome: Completed/Met Date Met:  11/12/15 Pt able to ambulate up to 600 ft in hall with no difficulties  Goal: Tolerates diet Outcome: Completed/Met Date Met:  11/12/15 Pt able to tolerate current diet with no difficulties  Goal: Vascular site scale level 0 - I Vascular Site Scale Level 0: No bruising/bleeding/hematoma Level I (Mild): Bruising/Ecchymosis, minimal bleeding/ooozing, palpable hematoma < 3 cm Level II (Moderate): Bleeding not affecting hemodynamic parameters, pseudoaneurysm, palpable hematoma > 3 cm Level III (Severe) Bleeding which affects hemodynamic parameters or retroperitoneal hemorrhage  Outcome: Completed/Met Date Met:  11/12/15 Radial and brachial sites level 0

## 2015-11-12 NOTE — Discharge Instructions (Signed)
Follow with Primary MD  in 7 days   Get CBC, CMP, 2 view Chest X ray checked  by Primary MD next visit.    Activity: As tolerated with Full fall precautions use walker/cane & assistance as needed   Disposition Home    Diet:   Heart Healthy Low Carb. Check your Weight same time everyday, if you gain over 2 pounds, or you develop in leg swelling, experience more shortness of breath or chest pain, call your Primary MD immediately. Follow Cardiac Low Salt Diet and 1.5 lit/day fluid restriction.   On your next visit with your primary care physician please Get Medicines reviewed and adjusted.   Please request your Prim.MD to go over all Hospital Tests and Procedure/Radiological results at the follow up, please get all Hospital records sent to your Prim MD by signing hospital release before you go home.   If you experience worsening of your admission symptoms, develop shortness of breath, life threatening emergency, suicidal or homicidal thoughts you must seek medical attention immediately by calling 911 or calling your MD immediately  if symptoms less severe.  You Must read complete instructions/literature along with all the possible adverse reactions/side effects for all the Medicines you take and that have been prescribed to you. Take any new Medicines after you have completely understood and accpet all the possible adverse reactions/side effects.   Do not drive, operate heavy machinery, perform activities at heights, swimming or participation in water activities or provide baby sitting services if your were admitted for syncope or siezures until you have seen by Primary MD or a Neurologist and advised to do so again.  Do not drive when taking Pain medications.    Do not take more than prescribed Pain, Sleep and Anxiety Medications  Special Instructions: If you have smoked or chewed Tobacco  in the last 2 yrs please stop smoking, stop any regular Alcohol  and or any Recreational drug  use.  Wear Seat belts while driving.   Please note  You were cared for by a hospitalist during your hospital stay. If you have any questions about your discharge medications or the care you received while you were in the hospital after you are discharged, you can call the unit and asked to speak with the hospitalist on call if the hospitalist that took care of you is not available. Once you are discharged, your primary care physician will handle any further medical issues. Please note that NO REFILLS for any discharge medications will be authorized once you are discharged, as it is imperative that you return to your primary care physician (or establish a relationship with a primary care physician if you do not have one) for your aftercare needs so that they can reassess your need for medications and monitor your lab values.

## 2015-11-12 NOTE — Progress Notes (Signed)
    Subjective:  Denies CP or dyspnea   Objective:  Filed Vitals:   11/11/15 2347 11/11/15 2348 11/12/15 0013 11/12/15 0500  BP:  101/70 97/60 104/78  Pulse:  72 80 77  Temp:   98.2 F (36.8 C) 98.8 F (37.1 C)  TempSrc:      Resp:  20 18 21   Height:      Weight:    240 lb 11.2 oz (109.181 kg)  SpO2: 92%  98% 98%    Intake/Output from previous day:  Intake/Output Summary (Last 24 hours) at 11/12/15 [id number] Last data filed at 11/12/15 0500  Gross per 24 hour  Intake  787.5 ml  Output    700 ml  Net   87.5 ml    Physical Exam: Physical exam: Well-developed well-nourished in no acute distress.  Skin is warm and dry.  HEENT is normal.  Neck is supple.  Chest is clear to auscultation with normal expansion.  Cardiovascular exam is regular rate and rhythm.  Abdominal exam nontender or distended. No masses palpated. Extremities show no edema. 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 Her pulmonary capillary wedge pressure was not elevated. Plan to continue carvedilol and present dose of Lasix/spironolactone. Continue Cozaar for now. She can be discharged from a cardiac standpoint and follow-up with one of our assistants in one week. At that time I would transition from Cozaar to entresto. Would plan to repeat echocardiogram 3 months later. If ejection  fraction less than 35% would need referral for ICD. 2 hypertension-continue present medications. 3 chronic systolic congestive heart failure-euvolemic on examination. Recent pulmonary capillary wedge pressure normal. Continue present dose of diuretics. FU with TOC appt one week and Dr Tamala Julian 8 weeks.  Kirk Ruths 11/12/2015, 9:22 AM

## 2015-11-12 NOTE — Progress Notes (Signed)
F/u made, see appts section. Dayna Dunn PA-C

## 2015-11-12 NOTE — Discharge Summary (Signed)
[name], is a 40 y.o. female  DOB 1975-10-31  MRN [id number].  Admission date:  11/09/2015  Admitting Physician  Megan Costa, MD  Discharge Date:  11/12/2015   Primary MD  No primary care provider on file.  Recommendations for primary care physician for things to follow:   Check CBC, BMP in 7-10 days. Close outpatient cardiology follow-up.   Admission Diagnosis  Shortness of breath [R06.02] Hypokalemia [E87.6] Diabetes mellitus without complication (HCC) [id number] SOB (shortness of breath) [R06.02] Tachycardia [R00.0] Postpartum cardiomyopathy [O90.3] Chest pain [R07.9] Calf pain, right [M79.661] Chest pain, unspecified chest pain type [R07.9] Acute on chronic congestive heart failure, unspecified congestive heart failure type ([place]) [I50.9]   Discharge Diagnosis  Shortness of breath [R06.02] Hypokalemia [E87.6] Diabetes mellitus without complication (HCC) [id number] SOB (shortness of breath) [R06.02] Tachycardia [R00.0] Postpartum cardiomyopathy [O90.3] Chest pain [R07.9] Calf pain, right [M79.661] Chest pain, unspecified chest pain type [R07.9] Acute on chronic congestive heart failure, unspecified congestive heart failure type ([place]) [I50.9]    Principal Problem:   Chest pain Active Problems:   Hypertension   OSA (obstructive sleep apnea)   Depression with anxiety   Obesity, morbid, BMI 40.0-49.9 (HCC)   Chronic combined systolic and diastolic heart failure (HCC)   SOB (shortness of breath)   Diabetes mellitus without complication (HCC)   Acute on chronic congestive heart failure (HCC)   Abnormal nuclear stress test      Past Medical History  Diagnosis Date  . Hypertension   . Ovarian cyst     1999; surgically removed  . OSA (obstructive sleep apnea)     on CPAP   . IBS (irritable bowel  syndrome)   . 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 Depression   . Anxiety   . Fibroid     age 61  . Termination of pregnancy     due to cardiac risk  . DVT (deep venous thrombosis) ([place])     2014  . CHF (congestive heart failure) ([place])   . Gallstones   . Diabetes mellitus without complication [facility])     Past Surgical History  Procedure Laterality Date  . Abdominal surgery      several  . Cesarean section    . Cholecystectomy    . Foot surgery Right   . Tubal ligation  1999  . Left heart catheterization with coronary angiogram N/A 02/26/2014    Procedure: LEFT HEART CATHETERIZATION WITH CORONARY ANGIOGRAM;  Surgeon: Megan Booze, MD;  Location: [facility] CATH LAB;  Service: Cardiovascular;  Laterality: N/A;  . Cardiac catheterization N/A 11/11/2015    Procedure: Right/Left Heart Cath and Coronary Angiography;  Surgeon: Megan Sine, MD;  Location: [facility] CV LAB;  Service: Cardiovascular;  Laterality: N/A;       HPI  from the history and physical done on the day of admission:   [name] is a 40 y.o. female with medical history significant of hypertension, diabetes mellitus, IBS, depression, anxiety, OSA  on CPAP, IBS, DVT not on anticoagulants, chronic combined systolic and diastolic CHF, who presents with chest pain, shortness of breath, right calf pain.  Patient states that she started having chest pain since yesterday morning at 3 AM. It is located in the left side of chest, just below rib cage, constant, pressure-like, nonradiating. It is pleuric, aggravated by deep breath. Patient has dry cough, but no fever or chills. She also has right calf tenderness since Monday. Patient has mild diarrhea due to IBS, which is at baseline. She has nausea, but no vomiting, abdominal pain. No symptoms of UTI or unilateral weakness. Of note, her dry weight is about 241, today for body weight is stable at 241 per patient.  ED Course: pt was found to have  elevated d-dimer 1.79, BNP 378, negative troponin, WBC 7.2, temperature normal, tachycardia, oxygen saturation 98%, potassium 3.4, creatinine normal. Chest x-ray showed bronchitic change. Patient is placed on telemetry bed for observation. 40 mg of lasix was given by EDP.    Hospital Course:     1. Atypical chest pain with shortness of breath. Most likely due to acute bronchitis, she does have ongoing cough for the last several days. She had a negative CT angiogram for PE. Lower extremity venous duplex -ve, stable echogram but Lexiscan high risk, She eventually underwent unremarkable left heart catheterization showing normal coronaries but a depressed EF suggestive of nonischemic peripartum cardiomyopathy, recommendation is to continue medical treatment. She is now symptom-free will follow with cardiology outpatient.  2. Chronic combined systolic and diastolic CHF, EF [id number], had history of nonischemic cardiomyopathy, currently compensated, continue Lasix, continue Coreg, continue Aldactone & ARB.  3. Essential hypertension. Stable on Coreg, Lasix and Aldactone.  4. Obstructive sleep apnea. CPAP at night.  5. Anxiety and depression. No acute issues, continue Effexor and as needed Xanax.  6. DM type II. Continue home regimen upon discharge. Follow with PCP for glycemic control and A1c monitoring.  7. Mild URI. Treated with azithromycin. No symptom-free and resolved.     Follow UP  Follow-up Information    Follow up with Megan Jester, PA-C.   Specialties:  Cardiology, Radiology   Why:  [place] location - 11/20/15 at [place] information:   [place] [id number] [phone]       Follow up with PCP. Schedule an appointment as soon as possible for a visit in 1 week.       Consults obtained - Cards  Discharge Condition: Stable  Diet and Activity recommendation: See Discharge Instructions below  Discharge Instructions         Discharge Instructions    Discharge instructions    Complete by:  As directed   Follow with Primary MD  in 7 days   Get CBC, CMP, 2 view Chest X ray checked  by Primary MD next visit.    Activity: As tolerated with Full fall precautions use walker/cane & assistance as needed   Disposition Home    Diet:   Heart Healthy Low Carb. Check your Weight same time everyday, if you gain over 2 pounds, or you develop in leg swelling, experience more shortness of breath or chest pain, call your Primary MD immediately. Follow Cardiac Low Salt Diet and 1.5 lit/day fluid restriction.   On your next visit with your primary care physician please Get Medicines reviewed and adjusted.   Please request your Prim.MD to go over all Hospital Tests and  Procedure/Radiological results at the follow up, please get all Hospital records sent to your Prim MD by signing hospital release before you go home.   If you experience worsening of your admission symptoms, develop shortness of breath, life threatening emergency, suicidal or homicidal thoughts you must seek medical attention immediately by calling 911 or calling your MD immediately  if symptoms less severe.  You Must read complete instructions/literature along with all the possible adverse reactions/side effects for all the Medicines you take and that have been prescribed to you. Take any new Medicines after you have completely understood and accpet all the possible adverse reactions/side effects.   Do not drive, operate heavy machinery, perform activities at heights, swimming or participation in water activities or provide baby sitting services if your were admitted for syncope or siezures until you have seen by Primary MD or a Neurologist and advised to do so again.  Do not drive when taking Pain medications.    Do not take more than prescribed Pain, Sleep and Anxiety Medications  Special Instructions: If you have smoked or chewed Tobacco  in the last 2  yrs please stop smoking, stop any regular Alcohol  and or any Recreational drug use.  Wear Seat belts while driving.   Please note  You were cared for by a hospitalist during your hospital stay. If you have any questions about your discharge medications or the care you received while you were in the hospital after you are discharged, you can call the unit and asked to speak with the hospitalist on call if the hospitalist that took care of you is not available. Once you are discharged, your primary care physician will handle any further medical issues. Please note that NO REFILLS for any discharge medications will be authorized once you are discharged, as it is imperative that you return to your primary care physician (or establish a relationship with a primary care physician if you do not have one) for your aftercare needs so that they can reassess your need for medications and monitor your lab values.     Increase activity slowly    Complete by:  As directed              Discharge Medications       Medication List    TAKE these medications        albuterol 108 (90 Base) MCG/ACT inhaler  Commonly known as:  PROVENTIL HFA;VENTOLIN HFA  Inhale 2 puffs into the lungs every 6 (six) hours as needed for wheezing or shortness of breath.     carvedilol 25 MG tablet  Commonly known as:  COREG  Take 1 tablet (25 mg total) by mouth 2 (two) times daily with a meal.     colesevelam 625 MG tablet  Commonly known as:  WELCHOL  Take 1 tablet (625 mg total) by mouth 3 (three) times daily with meals.     cyclobenzaprine 10 MG tablet  Commonly known as:  FLEXERIL  Take 1 tablet (10 mg total) by mouth 3 (three) times daily as needed for muscle spasms.     fluticasone 50 MCG/ACT nasal spray  Commonly known as:  FLONASE  Place 1 spray into both nostrils at bedtime. Before donning CPAP     furosemide 40 MG tablet  Commonly known as:  LASIX  Take 2 tablets (80 mg total) by mouth 2 (two) times  daily.     gabapentin 100 MG capsule  Commonly known as:  NEURONTIN  Take  100 mg by mouth daily as needed. For pain     Liraglutide 18 MG/3ML Sopn  Commonly known as:  VICTOZA  Inject 0.3 mLs (1.8 mg total) into the skin daily.     losartan 100 MG tablet  Commonly known as:  COZAAR  Take 1 tablet (100 mg total) by mouth daily.     meloxicam 15 MG tablet  Commonly known as:  MOBIC  Take 1 tablet (15 mg total) by mouth daily.     Pen Needles 32G X 4 MM Misc  Inject 1 pen into the skin daily as needed (to administer insulin.).     potassium chloride SA 20 MEQ tablet  Commonly known as:  K-DUR,KLOR-CON  Take 2 tablets (40 mEq total) by mouth 2 (two) times daily.     spironolactone 25 MG tablet  Commonly known as:  ALDACTONE  Take 1 tablet (25 mg total) by mouth daily.     venlafaxine XR 75 MG 24 hr capsule  Commonly known as:  EFFEXOR-XR  Take 225 mg by mouth daily with breakfast.     Vitamin D (Ergocalciferol) 50000 units Caps capsule  Commonly known as:  DRISDOL  Take 1 capsule (50,000 Units total) by mouth every 7 (seven) days.        Major procedures and Radiology Reports - PLEASE review detailed and final reports for all details, in brief -    CTA Chest -ve  TTE   Left ventricle: The cavity size was moderately dilated. Wall thickness was normal. Systolic function was severely reduced. The estimated ejection fraction was in the range of 25% to 30%. Diffuse hypokinesis. Left ventricular diastolic function parameters were normal. - Mitral valve: There was mild to moderate regurgitation. - Left atrium: The atrium was moderately dilated. - Right atrium: The atrium was mildly dilated. - Atrial septum: No defect or patent foramen ovale was identified. - Tricuspid valve: There was moderate regurgitation. - Pulmonary arteries: PA peak pressure: 51 mm Hg (S).  Vascular Ultrasound Lower extremity venous duplex has been completed. Preliminary findings: No evidence of DVT  or baker's cyst.  Lexiscan. High risk.  L heart Cath - Mild pulmonary hypertension. Severe global LV dysfunction with diffuse hypokinesis and an ejection fraction of 25-30%. Normal coronary arteries. Findings are suggestive of a nonischemic cardiomyopathy in this patient who developed postpartum cardiomyopathy following delivery of her first and only child in 1999.  RECOMMENDATION: Maximization of medical therapy.    Dg Chest 2 View  11/08/2015  CLINICAL DATA:  Initial evaluation for acute left-sided chest pain, shortness of breath. EXAM: CHEST  2 VIEW COMPARISON:  Prior radiograph 04/25/2015. FINDINGS: Mild cardiomegaly is stable. Mediastinal silhouette within normal limits. Lungs are normally inflated. Mild diffuse bronchitic changes. No pulmonary edema or pleural effusion. No pneumothorax. No acute osseous abnormality. IMPRESSION: 1. Mild diffuse peribronchial thickening, which may reflect acute bronchiolitis in the setting of cough and shortness of breath. 2. No other active cardiopulmonary disease. Electronically Signed   By: Jeannine Boga M.D.   On: 11/08/2015 22:25   Ct Angio Chest Pe W Or Wo Contrast  11/09/2015  CLINICAL DATA:  Left-sided chest pain EXAM: CT ANGIOGRAPHY CHEST WITH CONTRAST TECHNIQUE: Multidetector CT imaging of the chest was performed using the standard protocol during bolus administration of intravenous contrast. Multiplanar CT image reconstructions and MIPs were obtained to evaluate the vascular anatomy. CONTRAST:  New 75 cc Isovue 370 COMPARISON:  None. FINDINGS: Mediastinum/Lymph Nodes: No pulmonary emboli or thoracic aortic dissection identified.  No masses or pathologically enlarged lymph nodes identified. The heart is enlarged. Lungs/Pleura: No pneumothorax or pleural effusion. There is ground-glass opacity likely related to volume loss. Dependent atelectasis at the left base. Upper abdomen: No acute findings. Musculoskeletal: No chest wall mass or suspicious bone  lesions identified. Review of the MIP images confirms the above findings. IMPRESSION: No acute pulmonary thromboembolism. Electronically Signed   By: Marybelle Killings M.D.   On: 11/09/2015 10:47   Nm Myocar Multi W/spect W/wall Motion / Ef  11/10/2015  CLINICAL DATA:  40 year old female with a history of chest pain. Cardiovascular risk factors include hypertension, diabetes, obesity. EXAM: MYOCARDIAL IMAGING WITH SPECT (REST AND PHARMACOLOGIC-STRESS) GATED LEFT VENTRICULAR WALL MOTION STUDY LEFT VENTRICULAR EJECTION FRACTION TECHNIQUE: Standard myocardial SPECT imaging was performed after resting intravenous injection of 10 mCi Tc-51m tetrofosmin. Subsequently, intravenous infusion of Lexiscan was performed under the supervision of the Cardiology staff. At peak effect of the drug, 30 mCi Tc-77m tetrofosmin was injected intravenously and standard myocardial SPECT imaging was performed. Quantitative gated imaging was also performed to evaluate left ventricular wall motion, and estimate left ventricular ejection fraction. COMPARISON:  None. FINDINGS: Perfusion: Moderate-sized defect of moderate severity involving the lateral wall on perfusion images extending from mid ventricle to the base. In addition, perfusion images demonstrate evidence of transient ischemic dilation. Wall Motion: Global hypokinesia, with paradoxical motion of the septal wall towards the base. Left Ventricular Ejection Fraction: 34 % End diastolic volume [id number] ml End systolic volume 96 ml IMPRESSION: 1. Region of reversible ischemia of moderate size and moderate severity involving the lateral wall from the mid ventricle to the base. There is evidence of transient ischemic dilation (TID) on perfusion series. 2. Global hypokinesia at with paradoxical motion at the septal wall towards the base. 3. Left ventricular ejection fraction 34% 4. Non invasive risk stratification*: High risk, categorized by the low ejection fraction, severe abnormal diastolic  ventricular volume, with reversible defect of the lateral wall. *2012 Appropriate Use Criteria for Coronary Revascularization Focused Update: J Am Coll Cardiol. N6492421. http://content.airportbarriers.com.aspx?articleid=1201161 Electronically Signed   By: Corrie Mckusick D.O.   On: 11/10/2015 14:59    Micro Results      No results found for this or any previous visit (from the past 240 hour(s)).     Today   Subjective    [name] today has no headache,no chest abdominal pain,no new weakness tingling or numbness, feels much better wants to go home today.     Objective   Blood pressure 104/78, pulse 77, temperature 98.8 F (37.1 C), temperature source Oral, resp. rate 21, height 5\' 6"  (1.676 m), weight 109.181 kg (240 lb 11.2 oz), last menstrual period 10/24/2015, SpO2 98 %.   Intake/Output Summary (Last 24 hours) at 11/12/15 1028 Last data filed at 11/12/15 0500  Gross per 24 hour  Intake  787.5 ml  Output    700 ml  Net   87.5 ml    Exam Awake Alert, Oriented x 3, No new F.N deficits, Normal affect [place].AT,PERRAL Supple Neck,No JVD, No cervical lymphadenopathy appriciated.  Symmetrical Chest wall movement, Good air movement bilaterally, CTAB RRR,No Gallops,Rubs or new Murmurs, No Parasternal Heave +ve B.Sounds, Abd Soft, Non tender, No organomegaly appriciated, No rebound -guarding or rigidity. No Cyanosis, Clubbing or edema, No new Rash or bruise   Data Review   CBC w Diff: Lab Results  Component Value Date   WBC 6.6 11/12/2015   HGB 11.3* 11/12/2015   HCT 36.8 11/12/2015  PLT 207 11/12/2015   LYMPHOPCT 28.6 10/15/2014   MONOPCT 7.1 10/15/2014   EOSPCT 1.4 10/15/2014   BASOPCT 0.7 10/15/2014    CMP: Lab Results  Component Value Date   NA 137 11/12/2015   K 3.8 11/12/2015   CL 105 11/12/2015   CO2 26 11/12/2015   BUN 12 11/12/2015   CREATININE 0.94 11/12/2015   CREATININE 0.77 03/11/2015   PROT 8.5* 02/25/2014   ALBUMIN 4.2 02/25/2014     BILITOT 0.3 02/25/2014   ALKPHOS 123* 02/25/2014   AST 29 02/25/2014   ALT 20 02/25/2014  .   Total Time in preparing paper work, data evaluation and todays exam - 35 minutes  Thurnell Lose M.D on 11/12/2015 at 10:28 AM  [facility]   Office  [phone]

## 2015-11-12 NOTE — Telephone Encounter (Signed)
New message     TCM appt on 6.28.2017 @ 8am & 8:30 with Lyda Jester & Pharmacist per Melina Copa.

## 2015-11-13 MED FILL — Heparin Sodium (Porcine) 2 Unit/ML in Sodium Chloride 0.9%: INTRAMUSCULAR | Qty: 500 | Status: AC

## 2015-11-13 NOTE — Telephone Encounter (Signed)
Patient contacted regarding discharge from [facility] on 11/12/15.  Patient understands to follow up with provider Ellen Henri, PA on Wed. at 8:00 am at [facility]. Patient understands discharge instructions? Yes Patient understands medications and regiment? Yes Patient understands to bring all medications to this visit? Yes

## 2015-11-20 ENCOUNTER — Ambulatory Visit (INDEPENDENT_AMBULATORY_CARE_PROVIDER_SITE_OTHER): Payer: 59 | Admitting: Cardiology

## 2015-11-20 ENCOUNTER — Encounter: Payer: Self-pay | Admitting: Cardiology

## 2015-11-20 ENCOUNTER — Ambulatory Visit: Payer: 59

## 2015-11-20 VITALS — BP 122/84 | HR 95 | Ht 66.0 in | Wt 243.4 lb

## 2015-11-20 DIAGNOSIS — R0600 Dyspnea, unspecified: Secondary | ICD-10-CM | POA: Diagnosis not present

## 2015-11-20 DIAGNOSIS — I10 Essential (primary) hypertension: Secondary | ICD-10-CM | POA: Diagnosis not present

## 2015-11-20 DIAGNOSIS — O903 Peripartum cardiomyopathy: Secondary | ICD-10-CM

## 2015-11-20 MED ORDER — FUROSEMIDE 40 MG PO TABS: 80.0000 mg | ORAL_TABLET | Freq: Two times a day (BID) | ORAL | Status: DC

## 2015-11-20 MED ORDER — SPIRONOLACTONE 25 MG PO TABS: 25.0000 mg | ORAL_TABLET | Freq: Every day | ORAL | Status: DC

## 2015-11-20 MED ORDER — CARVEDILOL 25 MG PO TABS: 25.0000 mg | ORAL_TABLET | Freq: Two times a day (BID) | ORAL | Status: DC

## 2015-11-20 MED ORDER — SACUBITRIL-VALSARTAN 49-51 MG PO TABS: 1.0000 | ORAL_TABLET | Freq: Two times a day (BID) | ORAL | Status: DC

## 2015-11-20 NOTE — Progress Notes (Signed)
11/20/2015 [name]   1975/11/20  [id number]  Primary Physician No PCP Per Patient Primary Cardiologist: Dr. Tamala Julian   Reason for Visit/CC: [facility] F/u for Chest Pain/ Systolic HF  HPI:  40 y/o AAF, followed by Dr. Stanford Breed, who presents to clinic for post hospital f/u after recent hospitalization at [facility] for CP/ systolic HF. She has a history of obesity, hypertension, obstructive sleep apnea and depression. She developed a postpartum cardiomyopathy in 1999. She has had problems with chronic combined systolic and diastolic heart failure. Echo in 2015 showed EF of 35-40%.  She presented to [facility] on 11/09/15 with a complaint of SSCP. She ruled out for MI with negative enzymes. 2D echo was obtained which demonstrated further reduction in EF down to 25-30%.  A nuclear perfusion study was high risk and suggested reversible ischemia. Subsequently, she was referred for definitive right and left heart cardiac catheterization, performed by Dr. Claiborne Billings 11/11/15. She was found to have normal coronaries, suggestive of nonischemic cardiomyopathy. She was continued on her home ARB, BB, spironolactone and home lasix. Dr. Stanford Breed noted to switch from Losartan to [facility] at time of her hospital f/u.  She presents to clinic for f/u. She reports that she has done fairly well. She denies any significant resting dyspnea but she continues to have mild exertional dyspnea with moderate activity. Her main complaint is constant fatigue and decreased exercise tolerance. Also notes orthopnea and PND. No lower extremity edema. She reports full medication compliance at home. She is also been fully compliant with daily weights and her weight has remained stable. She has been inherent a low-sodium diet. Her pressure today in clinic is stable at 122/84.    Current Outpatient Prescriptions  Medication Sig Dispense Refill  . albuterol (PROVENTIL HFA;VENTOLIN HFA) 108 (90 Base) MCG/ACT inhaler Inhale 2 puffs into the lungs  every 6 (six) hours as needed for wheezing or shortness of breath. 1 Inhaler 0  . Armodafinil (NUVIGIL) 250 MG tablet Take 250 mg by mouth daily.    . carvedilol (COREG) 25 MG tablet Take 1 tablet (25 mg total) by mouth 2 (two) times daily with a meal. 180 tablet 3  . colesevelam (WELCHOL) 625 MG tablet Take 1 tablet (625 mg total) by mouth 3 (three) times daily with meals. 270 tablet 1  . cyclobenzaprine (FLEXERIL) 10 MG tablet Take 1 tablet (10 mg total) by mouth 3 (three) times daily as needed for muscle spasms. 30 tablet 0  . diazepam (VALIUM) 10 MG tablet Take 10 mg by mouth daily.  3  . fluticasone (FLONASE) 50 MCG/ACT nasal spray Place 1 spray into both nostrils daily.    . furosemide (LASIX) 40 MG tablet Take 2 tablets (80 mg total) by mouth 2 (two) times daily. 120 tablet 12  . gabapentin (NEURONTIN) 100 MG capsule Take 100 mg by mouth daily as needed. For pain    . Insulin Pen Needle (PEN NEEDLES) 32G X 4 MM MISC Inject 1 pen into the skin daily as needed (to administer insulin.). 100 each 3  . Liraglutide (VICTOZA) 18 MG/3ML SOPN Inject 0.3 mLs (1.8 mg total) into the skin daily. 27 mL 1  . meloxicam (MOBIC) 15 MG tablet Take 1 tablet (15 mg total) by mouth daily. 30 tablet 0  . potassium chloride SA (K-DUR,KLOR-CON) 20 MEQ tablet Take 2 tablets (40 mEq total) by mouth 2 (two) times daily. 124 tablet 12  . spironolactone (ALDACTONE) 25 MG tablet Take 1 tablet (25 mg total) by  mouth daily. 90 tablet 3  . venlafaxine XR (EFFEXOR-XR) 75 MG 24 hr capsule Take 225 mg by mouth daily with breakfast.    . Vitamin D, Ergocalciferol, (DRISDOL) 50000 units CAPS capsule Take 1 capsule (50,000 Units total) by mouth every 7 (seven) days. 8 capsule 0  . sacubitril-valsartan (ENTRESTO) 49-51 MG Take 1 tablet by mouth 2 (two) times daily. 60 tablet 0   No current facility-administered medications for this visit.    Allergies  Allergen Reactions  . Vancomycin Other (See Comments)    "did something  to my kidneys"; went into kidney failure  . Aspirin Other (See Comments)    Wheezing; but patient still takes if she needs to  . Ciprofloxacin Itching and Rash  . Sulfa Antibiotics Itching and Rash    Social History   Social History  . Marital Status: Married    Spouse Name: N/A  . Number of Children: 1  . Years of Education: N/A   Occupational History  . stay at home mom    Social History Main Topics  . Smoking status: Never Smoker   . Smokeless tobacco: Never Used  . Alcohol Use: No  . Drug Use: No  . Sexual Activity: Not on file   Other Topics Concern  . Not on file   Social History Narrative     Review of Systems: General: negative for chills, fever, night sweats or weight changes.  Cardiovascular: negative for chest pain, dyspnea on exertion, edema, orthopnea, palpitations, paroxysmal nocturnal dyspnea or shortness of breath Dermatological: negative for rash Respiratory: negative for cough or wheezing Urologic: negative for hematuria Abdominal: negative for nausea, vomiting, diarrhea, bright red blood per rectum, melena, or hematemesis Neurologic: negative for visual changes, syncope, or dizziness All other systems reviewed and are otherwise negative except as noted above.    Blood pressure 122/84, pulse 95, height 5\' 6"  (1.676 m), weight 243 lb 6.4 oz (110.406 kg), last menstrual period 10/24/2015.  General appearance: alert, cooperative and no distress Neck: no carotid bruit and no JVD Lungs: clear to auscultation bilaterally Heart: regular rate and rhythm, S1, S2 normal, no murmur, click, rub or gallop Extremities: no LEE Pulses: 2+ and symmetric Skin: warm and dry Neurologic: Grossly normal  EKG not performed.   ASSESSMENT AND PLAN:   1. NICM/Chronic Systolic HF: h/o postpartum cardiomyopathy in 1999 (EF 35-40% in 2015). She has had further reduction in EF down to 25-30% by echo during recent hospitalization. LHC revealed normal coronaries. Plan is to  continue guidelines directed medical therapy for systolic HF. She is currently on ARB therapy, however it was recommended by  Dr. Stanford Breed to switch to [facility]. Hospital BMP at time of discharge 6/20 showed normal renal function and K. We will plan to do this today. Stop losartan. She has been taking 100 mg of Losartan daily, thus will need 49/51 mg dose of Entresto BID. F/u BMP f/u with pharmacist in 2 weeks. Continue BB therapy with Coreg + spironolactone. She is already on max dose of Coreg, 25 mg BID. Can consider addition of Corlanor later if resting HR remains elevated >70 bpm. Continue current home lasix dose.  We discussed importance of low sodium diet and daily weights. Per guidelines, if no improvement in EF to >35% after 3 months of maximum guidelines directed medical therapy, she will need referral to EP for consideration for ICD for primary prevention.    PLAN  F/u in 2 weeks with pharmacist for further medication titration. 4 weeks  with Dr. Tamala Julian or APP.   Lyda Jester PA-C 11/20/2015 11:56 AM

## 2015-11-20 NOTE — Patient Instructions (Addendum)
Medication Instructions:  Your physician has recommended you make the following change in your medication:  1.  STOP the Losartan 2.  START the Entresto 49-51 mg taking 1 tablet twice a day   Labwork: None ordered  Testing/Procedures: None ordered  Follow-Up: Your physician recommends that you schedule a follow-up appointment in: [place], [place] Your physician recommends that you schedule a follow-up appointment in: [place] OR [facility]   Any Other Special Instructions Will Be Listed Below (If Applicable).    If you need a refill on your cardiac medications before your next appointment, please call your pharmacy.

## 2015-12-03 ENCOUNTER — Encounter: Payer: Self-pay | Admitting: Family Medicine

## 2015-12-03 ENCOUNTER — Ambulatory Visit (INDEPENDENT_AMBULATORY_CARE_PROVIDER_SITE_OTHER): Payer: 59 | Admitting: Family Medicine

## 2015-12-03 ENCOUNTER — Other Ambulatory Visit: Payer: Self-pay

## 2015-12-03 VITALS — BP 130/84 | HR 105 | Ht 66.0 in | Wt 240.0 lb

## 2015-12-03 DIAGNOSIS — M9901 Segmental and somatic dysfunction of cervical region: Secondary | ICD-10-CM | POA: Diagnosis not present

## 2015-12-03 DIAGNOSIS — M9902 Segmental and somatic dysfunction of thoracic region: Secondary | ICD-10-CM

## 2015-12-03 DIAGNOSIS — M9908 Segmental and somatic dysfunction of rib cage: Secondary | ICD-10-CM

## 2015-12-03 DIAGNOSIS — S43431D Superior glenoid labrum lesion of right shoulder, subsequent encounter: Secondary | ICD-10-CM

## 2015-12-03 DIAGNOSIS — M25511 Pain in right shoulder: Secondary | ICD-10-CM

## 2015-12-03 DIAGNOSIS — M940 Chondrocostal junction syndrome [Tietze]: Secondary | ICD-10-CM

## 2015-12-03 DIAGNOSIS — M999 Biomechanical lesion, unspecified: Secondary | ICD-10-CM

## 2015-12-03 MED ORDER — DICLOFENAC SODIUM 2 % TD SOLN: TRANSDERMAL | Status: DC

## 2015-12-03 NOTE — Progress Notes (Signed)
Pre visit review using our clinic review tool, if applicable. No additional management support is needed unless otherwise documented below in the visit note. 

## 2015-12-03 NOTE — Assessment & Plan Note (Signed)
Decision today to treat with OMT was based on Physical Exam  After verbal consent patient was treated with HVLA, ME, FPR techniques in cervical, thoracic and rib areas  Patient tolerated the procedure well with improvement in symptoms  Patient given exercises, stretches and lifestyle modifications  See medications in patient instructions if given  Patient will follow up in 6 weeks              

## 2015-12-03 NOTE — Progress Notes (Signed)
[name] [facility] [place], [place] [address] Phone: [phone] Subjective:     CC: Right shoulder pain, neck pain follow up  [name] is a 40 y.o. female coming in with complaint of right shoulder neck pain Patient is having aggravating symptoms that is keeping her up at night.  There is concerned the patient does have a labral tear in her shoulder. [name] last injection was many months ago. Patient is having worsening pain. States that she does not know what is coming from her neck or her shoulder itself. Patient states that the neck pain seems to be doing relatively well as well. Has responded well to osteopathic manipulation. Still some mild discomfort but nothing that is severe.     patient also has some chronic neck pain.  Patient has been responding very well to osteopathic manipulation therapy. X-rays that have been independently visualized by me showing mild arthritic changes.patient also has some mild arthritic changes of the shoulder more of a spur off the acromion.   Past Medical History  Diagnosis Date  . Hypertension   . Ovarian cyst     1999; surgically removed  . OSA (obstructive sleep apnea)     on CPAP   . IBS (irritable bowel syndrome)   . 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 Depression   . Anxiety   . Fibroid     age 68  . Termination of pregnancy     due to cardiac risk  . DVT (deep venous thrombosis) ([place])     2014  . CHF (congestive heart failure) ([place])   . Gallstones   . Diabetes mellitus without complication [facility])    Past Surgical History  Procedure Laterality Date  . Abdominal surgery      several  . Cesarean section    . Cholecystectomy    . Foot surgery Right   . Tubal ligation  1999  . Left heart catheterization with coronary angiogram N/A 02/26/2014    Procedure: LEFT HEART CATHETERIZATION WITH CORONARY ANGIOGRAM;  Surgeon: Jettie Booze, MD;   Location: [facility] CATH LAB;  Service: Cardiovascular;  Laterality: N/A;  . Cardiac catheterization N/A 11/11/2015    Procedure: Right/Left Heart Cath and Coronary Angiography;  Surgeon: Troy Sine, MD;  Location: [place] CV LAB;  Service: Cardiovascular;  Laterality: N/A;   Social History  Substance Use Topics  . Smoking status: Never Smoker   . Smokeless tobacco: Never Used  . Alcohol Use: No   Allergies  Allergen Reactions  . Vancomycin Other (See Comments)    "did something to my kidneys"; went into kidney failure  . Aspirin Other (See Comments)    Wheezing; but patient still takes if she needs to  . Ciprofloxacin Itching and Rash  . Sulfa Antibiotics Itching and Rash   Family History  Problem Relation Age of Onset  . Emphysema Maternal Grandmother     smoked  . Allergies Daughter   . Heart disease Maternal Grandmother 13    MI  . Rheum arthritis Mother    workup for rheumatoid arthritis has not been done that patient has had elevated ESR previously.  Past medical history, social, surgical and family history all reviewed in electronic medical record.   Review of Systems: No headache, visual changes, nausea, vomiting, diarrhea, constipation, dizziness, abdominal pain, skin rash, fevers, chills, night sweats, weight loss, swollen lymph nodes, body aches, joint swelling, muscle aches,  chest pain, shortness of breath, mood changes.   Objective Blood pressure 130/84, pulse 105, height _0  (1.676 m), weight 240 lb (108.863 kg), last menstrual period 10/24/2015, SpO2 98 %.  General: No apparent distress alert and oriented x3 mood and affect normal, dressed appropriately.  HEENT: Pupils equal, extraocular movements intact  Respiratory: [name] speak in full sentences and does not appear short of breath  Cardiovascular: No lower extremity edema, non tender, no erythema  Skin: Warm dry intact with no signs of infection or rash on extremities or on axial skeleton.  Abdomen: Soft  nontender  Neuro: Cranial nerves II through XII are intact, neurovascularly intact in all extremities with 2+ DTRs and 2+ pulses.  Lymph: No lymphadenopathy of posterior or anterior cervical chain or axillae bilaterally.  Gait normal with good balance and coordination.  MSK:  Non tender with full range of motion and good stability and symmetric strength and tone of  elbows, wrist, hip, and knee,  ankles bilaterally.   Shoulder: Right Inspection reveals no abnormalities, atrophy or asymmetry. Palpation is normal with no tenderness over AC joint or bicipital groove. ROM is full in all planes passively. Rotator cuff strength normal throughout. Negative impingement signs Speeds and Yergason's tests normal. Positive labral pathology still present Normal scapular function observed. No painful arc and no drop arm sign. No apprehension sign Contralateral shoulder unremarkable  no change from previous exam  Osteopathic findings Cervical C2 flexed rotated and side bent right C4 flexed rotated and side bent left C6 flexed rotated and side bent right  Thoracic T1 extended rotated and side bent right with elevated first rib T3 extended rotated and side bent left  Procedure: Real-time Ultrasound Guided Injection of right glenohumeral joint Device: GE Logiq E  Ultrasound guided injection is preferred based studies that show increased duration, increased effect, greater accuracy, decreased procedural pain, increased response rate with ultrasound guided versus blind injection.  Verbal informed consent obtained.  Time-out conducted.  Noted no overlying erythema, induration, or other signs of local infection.  Skin prepped in a sterile fashion.  Local anesthesia: Topical Ethyl chloride.  With sterile technique and under real time ultrasound guidance:  Joint visualized.  23g 1  inch needle inserted posterior approach. Pictures taken for needle placement. Patient did have injection of 2 cc of 1%  lidocaine, 2 cc of 0.5% Marcaine, and 1.0 cc of Kenalog 40 mg/dL. Completed without difficulty  Pain immediately resolved suggesting accurate placement of the medication.  Advised to call if fevers/chills, erythema, induration, drainage, or persistent bleeding.  Images permanently stored and available for review in the ultrasound unit.  Impression: Technically successful ultrasound guided injection.      Impression and Recommendations:     This case required medical decision making of moderate complexity.

## 2015-12-03 NOTE — Assessment & Plan Note (Signed)
I believe some of the upper back and neck pain is secondary to this low-grade syndrome. Continues respond fairly well to osteopathic manipulation. Discussed that I would like to see her on regular intervals. Follow-up again in 6 weeks. Encourage patient to work on Engineer, building services throughout the day.

## 2015-12-03 NOTE — Patient Instructions (Signed)
Good to see you  Ice 20 minutes 2 times daily. Usually after activity and before bed. Physical therapy will be calling you pennsaid pinkie amount topically 2 times daily as needed.  Stay active.  You will need manipulation from time to time See me again in 6 weeks.

## 2015-12-03 NOTE — Assessment & Plan Note (Signed)
Patient given an injection. Tolerated the procedure well. We'll continue to monitor. Feels significantly better today. Patient will continue with home exercises. We'll avoid anti-inflammatories secondary to patient's chronic systolic congestive heart failure.

## 2015-12-05 ENCOUNTER — Ambulatory Visit: Payer: 59 | Admitting: Family

## 2015-12-05 ENCOUNTER — Ambulatory Visit (INDEPENDENT_AMBULATORY_CARE_PROVIDER_SITE_OTHER): Payer: 59 | Admitting: Pharmacist

## 2015-12-05 VITALS — BP 146/100 | HR 102 | Wt 238.0 lb

## 2015-12-05 DIAGNOSIS — Z7689 Persons encountering health services in other specified circumstances: Secondary | ICD-10-CM

## 2015-12-05 DIAGNOSIS — I5042 Chronic combined systolic (congestive) and diastolic (congestive) heart failure: Secondary | ICD-10-CM | POA: Diagnosis not present

## 2015-12-05 LAB — BASIC METABOLIC PANEL
BUN: 12 mg/dL (ref 7–25)
CO2: 24 mmol/L (ref 20–31)
Calcium: 9.2 mg/dL (ref 8.6–10.2)
Chloride: 106 mmol/L (ref 98–110)
Creat: 0.82 mg/dL (ref 0.50–1.10)
Glucose, Bld: 82 mg/dL (ref 65–99)
Potassium: 3.9 mmol/L (ref 3.5–5.3)
Sodium: 140 mmol/L (ref 135–146)

## 2015-12-05 MED ORDER — IVABRADINE HCL 5 MG PO TABS: 5.0000 mg | ORAL_TABLET | Freq: Two times a day (BID) | ORAL | Status: DC

## 2015-12-05 MED ORDER — SACUBITRIL-VALSARTAN 49-51 MG PO TABS: 1.0000 | ORAL_TABLET | Freq: Two times a day (BID) | ORAL | Status: DC

## 2015-12-05 NOTE — Patient Instructions (Signed)
1.  Continue Entresto 49/51.  2.  Start Corlanor 5mg  twice daily with food  3.  We will recheck your blood pressure and heart rate in 2 weeks.

## 2015-12-05 NOTE — Progress Notes (Signed)
12/05/2015 [name]   1975-06-27  [id number]  Primary Physician Mauricio Po, [place] Primary Cardiologist: Dr. Tamala Julian   Reason for Visit/CC: Heart Failure Med Titration   HPI:  [name] is a 40 y/o AAF patient of Dr. Tamala Julian who was seen by [name] on 6/28 for post hospital follow up.   Her PMH is significant for systolic HF (EF 123XX123), obesity, HTN, OSA, and depression.  She was discharged from the hospital on ARB, BB, spironolactone and furosemide with plans to start Entresto at follow up.  At her visit on 6/28, she was switched from losartan to [facility].  She is here today for follow up.    Pt is doing well today.  She has not noticed any difference with Entresto but states she has been more tired recently.  She is not sure if this is related to the medication change or not.  Her weight is stable and no swelling noted.  She has not checked her BP at home.  She does have a cuff but does not use it on a regular basis.   BP Readings from Last 3 Encounters:  12/05/15 146/100  12/03/15 130/84  11/20/15 122/84   Pulse Readings from Last 3 Encounters:  12/05/15 102  12/03/15 105  11/20/15 95     Current Outpatient Prescriptions  Medication Sig Dispense Refill  . albuterol (PROVENTIL HFA;VENTOLIN HFA) 108 (90 Base) MCG/ACT inhaler Inhale 2 puffs into the lungs every 6 (six) hours as needed for wheezing or shortness of breath. 1 Inhaler 0  . Armodafinil (NUVIGIL) 250 MG tablet Take 250 mg by mouth daily.    . carvedilol (COREG) 25 MG tablet Take 1 tablet (25 mg total) by mouth 2 (two) times daily with a meal. 180 tablet 3  . colesevelam (WELCHOL) 625 MG tablet Take 1 tablet (625 mg total) by mouth 3 (three) times daily with meals. 270 tablet 1  . cyclobenzaprine (FLEXERIL) 10 MG tablet Take 1 tablet (10 mg total) by mouth 3 (three) times daily as needed for muscle spasms. 30 tablet 0  . diazepam (VALIUM) 10 MG tablet Take 10 mg by mouth daily.  3  . Diclofenac Sodium  2 % SOLN Apply 1 pump twice daily. 112 g 3  . fluticasone (FLONASE) 50 MCG/ACT nasal spray Place 1 spray into both nostrils daily.    . furosemide (LASIX) 40 MG tablet Take 2 tablets (80 mg total) by mouth 2 (two) times daily. 120 tablet 12  . gabapentin (NEURONTIN) 100 MG capsule Take 100 mg by mouth daily as needed. For pain    . Insulin Pen Needle (PEN NEEDLES) 32G X 4 MM MISC Inject 1 pen into the skin daily as needed (to administer insulin.). 100 each 3  . ivabradine (CORLANOR) 5 MG TABS tablet Take 1 tablet (5 mg total) by mouth 2 (two) times daily with a meal. 60 tablet 6  . Liraglutide (VICTOZA) 18 MG/3ML SOPN Inject 0.3 mLs (1.8 mg total) into the skin daily. 27 mL 1  . meloxicam (MOBIC) 15 MG tablet Take 1 tablet (15 mg total) by mouth daily. 30 tablet 0  . potassium chloride SA (K-DUR,KLOR-CON) 20 MEQ tablet Take 2 tablets (40 mEq total) by mouth 2 (two) times daily. 124 tablet 12  . sacubitril-valsartan (ENTRESTO) 49-51 MG Take 1 tablet by mouth 2 (two) times daily. 60 tablet 11  . spironolactone (ALDACTONE) 25 MG tablet Take 1 tablet (25 mg total) by mouth daily. 90 tablet 3  .  venlafaxine XR (EFFEXOR-XR) 75 MG 24 hr capsule Take 225 mg by mouth daily with breakfast.    . Vitamin D, Ergocalciferol, (DRISDOL) 50000 units CAPS capsule Take 1 capsule (50,000 Units total) by mouth every 7 (seven) days. 8 capsule 0   No current facility-administered medications for this visit.    Allergies  Allergen Reactions  . Vancomycin Other (See Comments)    "did something to my kidneys"; went into kidney failure  . Aspirin Other (See Comments)    Wheezing; but patient still takes if she needs to  . Ciprofloxacin Itching and Rash  . Sulfa Antibiotics Itching and Rash    ASSESSMENT AND PLAN:   1. NICM/Chronic Systolic HF: Pt doing well with Entresto.  Her diastolic BP is elevated today but she did not take any medications prior to her appointment.  Will check BMET today with addition of  Entresto.  Her HR remains elevated.  Will add Corlanor 5mg  BID to help lower her HR.  Also asked patient to monitor her BP and HR at home for the next 2 weeks.  She has a follow up appointment with Dr. Tamala Julian on 7/21.    Elberta Leatherwood, PharmD, BCPS, CPP 12/05/2015 2:56 PM

## 2015-12-11 ENCOUNTER — Other Ambulatory Visit: Payer: Self-pay | Admitting: Family Medicine

## 2015-12-11 MED ORDER — ALBUTEROL SULFATE HFA 108 (90 BASE) MCG/ACT IN AERS: 2.0000 | INHALATION_SPRAY | Freq: Four times a day (QID) | RESPIRATORY_TRACT | Status: DC | PRN

## 2015-12-11 MED ORDER — VITAMIN D (ERGOCALCIFEROL) 1.25 MG (50000 UNIT) PO CAPS
50000.0000 [IU] | ORAL_CAPSULE | ORAL | Status: DC
Start: 2015-12-11 — End: 2016-01-09

## 2015-12-11 NOTE — Addendum Note (Signed)
Addended by: Lyndal Pulley on: 12/11/2015 08:41 AM   Modules accepted: Orders

## 2015-12-12 NOTE — Progress Notes (Signed)
Cardiology Office Note    Date:  12/13/2015   ID:  CACY PHIN, DOB [birth year], MRN [id number]  PCP:  Megan Po, FNP  Cardiologist: Sinclair Grooms, MD   Chief Complaint  Patient presents with  . Congestive Heart Failure    History of Present Illness:  [name] is a 40 y.o. female who presents for f/u of hypertension, diabetes mellitus, IBS, depression, anxiety, OSA on CPAP, IBS, DVT not on anticoagulants, chronic combined systolic and diastolic CHF.Recent hospitalization for chest pain led to coronary angiography which revealed normal vessels, moderate elevation in pulmonary artery pressures, and LVEF 25-30%.  Unfortunately [name] has had LV decompensation since I last saw her several years ago. EF was dropped from 40% into the 25-30% range. As above she was admitted with chest pain and underwent an ischemic evaluation which was unremarkable. Since that time, as an outpatient heart failure therapy has been optimized by the addition of Entresto and Corlanor. She feels much better. She has some exertional intolerance.  Past Medical History  Diagnosis Date  . Hypertension   . Ovarian cyst     1999; surgically removed  . OSA (obstructive sleep apnea)     on CPAP   . IBS (irritable bowel syndrome)   . 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 Depression   . Anxiety   . Fibroid     age 52  . Termination of pregnancy     due to cardiac risk  . DVT (deep venous thrombosis) ([place])     2014  . CHF (congestive heart failure) ([place])   . Gallstones   . Diabetes mellitus without complication [facility])     Past Surgical History  Procedure Laterality Date  . Abdominal surgery      several  . Cesarean section    . Cholecystectomy    . Foot surgery Right   . Tubal ligation  1999  . Left heart catheterization with coronary angiogram N/A 02/26/2014    Procedure: LEFT HEART CATHETERIZATION WITH CORONARY ANGIOGRAM;  Surgeon: Jettie Booze, MD;   Location: [facility] CATH LAB;  Service: Cardiovascular;  Laterality: N/A;  . Cardiac catheterization N/A 11/11/2015    Procedure: Right/Left Heart Cath and Coronary Angiography;  Surgeon: Troy Sine, MD;  Location: [place] CV LAB;  Service: Cardiovascular;  Laterality: N/A;    Current Medications: Outpatient Prescriptions Prior to Visit  Medication Sig Dispense Refill  . albuterol (PROVENTIL HFA;VENTOLIN HFA) 108 (90 Base) MCG/ACT inhaler Inhale 2 puffs into the lungs every 6 (six) hours as needed for wheezing or shortness of breath. 1 Inhaler 0  . Armodafinil (NUVIGIL) 250 MG tablet Take 250 mg by mouth daily.    . carvedilol (COREG) 25 MG tablet Take 1 tablet (25 mg total) by mouth 2 (two) times daily with a meal. 180 tablet 3  . colesevelam (WELCHOL) 625 MG tablet Take 1 tablet (625 mg total) by mouth 3 (three) times daily with meals. 270 tablet 1  . cyclobenzaprine (FLEXERIL) 10 MG tablet Take 1 tablet (10 mg total) by mouth 3 (three) times daily as needed for muscle spasms. 30 tablet 0  . diazepam (VALIUM) 10 MG tablet Take 10 mg by mouth daily.  3  . Diclofenac Sodium 2 % SOLN Apply 1 pump twice daily. 112 g 3  . fluticasone (FLONASE) 50 MCG/ACT nasal spray Place 1 spray into both nostrils daily.    . furosemide (LASIX) 40 MG tablet  Take 2 tablets (80 mg total) by mouth 2 (two) times daily. 120 tablet 12  . gabapentin (NEURONTIN) 100 MG capsule Take 100 mg by mouth daily as needed. For pain    . Insulin Pen Needle (PEN NEEDLES) 32G X 4 MM MISC Inject 1 pen into the skin daily as needed (to administer insulin.). 100 each 3  . ivabradine (CORLANOR) 5 MG TABS tablet Take 1 tablet (5 mg total) by mouth 2 (two) times daily with a meal. 60 tablet 6  . Liraglutide (VICTOZA) 18 MG/3ML SOPN Inject 0.3 mLs (1.8 mg total) into the skin daily. 27 mL 1  . meloxicam (MOBIC) 15 MG tablet Take 1 tablet (15 mg total) by mouth daily. 30 tablet 0  . potassium chloride SA (K-DUR,KLOR-CON) 20 MEQ tablet Take  2 tablets (40 mEq total) by mouth 2 (two) times daily. 124 tablet 12  . sacubitril-valsartan (ENTRESTO) 49-51 MG Take 1 tablet by mouth 2 (two) times daily. 60 tablet 11  . spironolactone (ALDACTONE) 25 MG tablet Take 1 tablet (25 mg total) by mouth daily. 90 tablet 3  . venlafaxine XR (EFFEXOR-XR) 75 MG 24 hr capsule Take 225 mg by mouth daily with breakfast.    . Vitamin D, Ergocalciferol, (DRISDOL) 50000 units CAPS capsule Take 1 capsule (50,000 Units total) by mouth every 7 (seven) days. 8 capsule 0   No facility-administered medications prior to visit.     Allergies:   Vancomycin; Aspirin; Ciprofloxacin; and Sulfa antibiotics   Social History   Social History  . Marital Status: Married    Spouse Name: N/A  . Number of Children: 1  . Years of Education: N/A   Occupational History  . stay at home mom    Social History Main Topics  . Smoking status: Never Smoker   . Smokeless tobacco: Never Used  . Alcohol Use: No  . Drug Use: No  . Sexual Activity: Not Asked   Other Topics Concern  . None   Social History Narrative     Family History:  The patient's family history includes Allergies in her daughter; Emphysema in her maternal grandmother; Heart disease (age of onset: 20) in her maternal grandmother; Rheum arthritis in her mother.   ROS:   Please see the history of present illness.    Cough, wakes overnight occasionally short of breath when her C Pap mask comes off.  All other systems reviewed and are negative.   PHYSICAL EXAM:   VS:  BP 100/76 mmHg  Pulse 88  Ht 5\' 5"  (1.651 m)  Wt 237 lb (107.502 kg)  BMI 39.44 kg/m2   GEN: Well nourished, well developed, in no acute distress HEENT: normal Neck: no JVD, carotid bruits, or masses Cardiac: RRR; no murmurs, rubs, or gallops,no edema  Respiratory:  clear to auscultation bilaterally, normal work of breathing GI: soft, nontender, nondistended, + BS MS: no deformity or atrophy Skin: warm and dry, no rash Neuro:   Alert and Oriented x 3, Strength and sensation are intact Psych: euthymic mood, full affect  Wt Readings from Last 3 Encounters:  12/13/15 237 lb (107.502 kg)  12/05/15 238 lb (107.956 kg)  12/03/15 240 lb (108.863 kg)      Studies/Labs Reviewed:   EKG:  EKG  Not repeated.  Recent Labs: 11/08/2015: B Natriuretic Peptide 378.0* 11/10/2015: Magnesium 2.1 11/12/2015: Hemoglobin 11.3*; Platelets 207 12/05/2015: BUN 12; Creat 0.82; Potassium 3.9; Sodium 140   Lipid Panel    Component Value Date/Time   CHOL 160  11/09/2015 0526   TRIG 70 11/09/2015 0526   HDL 58 11/09/2015 0526   CHOLHDL 2.8 11/09/2015 0526   VLDL 14 11/09/2015 0526   LDLCALC 88 11/09/2015 0526    Additional studies/ records that were reviewed today include:  Study Conclusions  - Left ventricle: The cavity size was moderately dilated. Wall  thickness was normal. Systolic function was severely reduced. The  estimated ejection fraction was in the range of 25% to 30%.  Diffuse hypokinesis. Left ventricular diastolic function  parameters were normal. - Mitral valve: There was mild to moderate regurgitation. - Left atrium: The atrium was moderately dilated. - Right atrium: The atrium was mildly dilated. - Atrial septum: No defect or patent foramen ovale was identified. - Tricuspid valve: There was moderate regurgitation. - Pulmonary arteries: PA peak pressure: 51 mm Hg (S).  Left and Right heart catheterization 11/11/15:    Dominance: Right   Left Main  Vessel was injected. Vessel is normal in caliber. Vessel is angiographically normal.     Left Anterior Descending  The LAD was angiographically normal gave rise to 3 proximal diagonal vessels and several additional smaller diagonal vessels and septal perforators. The LAD and its branches were normal.     Left Circumflex  The left circumflex was angiographically normal.     Right Coronary Artery  The RCA was an angiographically normal dominant  vessel.       Right Heart Pressures RA: 8/7; mean 5 RV: 35/6 PA: 41/12 PW: 14/12; mean 11  LV: 99/20 PW: 20/21; mean 20  LV: 95/60 AO: 91/60  By the Fick method: cardiac output was 6.4 L/m and the cardiac index was 3 L/m/m.  Oxygen saturation in the aorta was 90% in the pulmonary artery 62%.       ASSESSMENT:    1. Chronic combined systolic and diastolic heart failure ([place])   2. Postpartum cardiomyopathy   3. Essential hypertension   4. Obesity, morbid, BMI 40.0-49.9 ([place])      PLAN:  In order of problems listed above:  1. Heart feed therapy will be optimized over the next 2-6 months. Blood pressures too low today to further titrate Entresto. Corlanor has brought about significant symptomatic improvement. Baker oral now will be affording medical therapy. Plan to perform a basic metabolic panel in a month and half clinical follow-up in 6-8 weeks at which time we will attempt to further uptitrate Entresto. In November timeframe we will repeat an echocardiogram and if EF remains significantly decreased we will have to consider AICD. 2. As above 3. Very well controlled    Medication Adjustments/Labs and Tests Ordered: Current medicines are reviewed at length with the patient today.  Concerns regarding medicines are outlined above.  Medication changes, Labs and Tests ordered today are listed in the Patient Instructions below. There are no Patient Instructions on file for this visit.   Signed, Sinclair Grooms, MD  12/13/2015 8:30 AM    [address], [place], [place]  [address] Phone: [phone]; Fax: [phone]

## 2015-12-13 ENCOUNTER — Ambulatory Visit (INDEPENDENT_AMBULATORY_CARE_PROVIDER_SITE_OTHER): Payer: 59 | Admitting: Interventional Cardiology

## 2015-12-13 ENCOUNTER — Encounter: Payer: Self-pay | Admitting: Interventional Cardiology

## 2015-12-13 VITALS — BP 100/76 | HR 88 | Ht 65.0 in | Wt 237.0 lb

## 2015-12-13 DIAGNOSIS — I10 Essential (primary) hypertension: Secondary | ICD-10-CM | POA: Diagnosis not present

## 2015-12-13 DIAGNOSIS — I5042 Chronic combined systolic (congestive) and diastolic (congestive) heart failure: Secondary | ICD-10-CM | POA: Diagnosis not present

## 2015-12-13 DIAGNOSIS — O903 Peripartum cardiomyopathy: Secondary | ICD-10-CM | POA: Diagnosis not present

## 2015-12-13 NOTE — Patient Instructions (Signed)
Medication Instructions:  Your physician recommends that you continue on your current medications as directed. Please refer to the Current Medication list given to you today.   Labwork: Your physician recommends that you return for lab work in mid August 2017 (bmet)   Testing/Procedures: None ordered  Follow-Up: Your physician recommends that you schedule a follow-up appointment in: September 2017 with Dr.Smith   Any Other Special Instructions Will Be Listed Below (If Applicable).     If you need a refill on your cardiac medications before your next appointment, please call your pharmacy.

## 2016-01-07 ENCOUNTER — Ambulatory Visit: Payer: 59 | Admitting: Family

## 2016-01-07 DIAGNOSIS — Z7689 Persons encountering health services in other specified circumstances: Secondary | ICD-10-CM

## 2016-01-09 ENCOUNTER — Other Ambulatory Visit: Payer: Self-pay | Admitting: Family Medicine

## 2016-01-09 ENCOUNTER — Encounter: Payer: Self-pay | Admitting: Cardiology

## 2016-01-10 ENCOUNTER — Other Ambulatory Visit: Payer: Self-pay | Admitting: Internal Medicine

## 2016-01-10 ENCOUNTER — Other Ambulatory Visit: Payer: Self-pay | Admitting: *Deleted

## 2016-01-10 DIAGNOSIS — I10 Essential (primary) hypertension: Secondary | ICD-10-CM

## 2016-01-10 MED ORDER — ALBUTEROL SULFATE HFA 108 (90 BASE) MCG/ACT IN AERS: 2.0000 | INHALATION_SPRAY | Freq: Four times a day (QID) | RESPIRATORY_TRACT | 0 refills | Status: DC | PRN

## 2016-01-10 MED ORDER — CARVEDILOL 25 MG PO TABS: 25.0000 mg | ORAL_TABLET | Freq: Two times a day (BID) | ORAL | 3 refills | Status: DC

## 2016-01-10 MED ORDER — VITAMIN D (ERGOCALCIFEROL) 1.25 MG (50000 UNIT) PO CAPS: 50000.0000 [IU] | ORAL_CAPSULE | ORAL | 0 refills | Status: DC

## 2016-01-10 MED ORDER — CYCLOBENZAPRINE HCL 10 MG PO TABS: 10.0000 mg | ORAL_TABLET | Freq: Three times a day (TID) | ORAL | 0 refills | Status: DC | PRN

## 2016-01-13 ENCOUNTER — Other Ambulatory Visit: Payer: Self-pay | Admitting: Internal Medicine

## 2016-01-13 NOTE — Progress Notes (Deleted)
[name] [address] [place], [place] [address] Phone: [phone] Subjective:     CC: Right shoulder pain, neck pain follow up  [id number]  [name] is a 40 y.o. female coming in with complaint of right shoulder neck pain Patient is having aggravating symptoms that is keeping her up at night.  There is concerned the patient does have a labral tear in her shoulder. [name] last injection was many months ago. Patient is having worsening pain. States that she does not know what is coming from her neck or her shoulder itself. Patient states that the neck pain seems to be doing relatively well as well. Has responded well to osteopathic manipulation. Still some mild discomfort but nothing that is severe.     patient also has some chronic neck pain.  Patient has been responding very well to osteopathic manipulation therapy. X-rays that have been independently visualized by me showing mild arthritic changes.patient also has some mild arthritic changes of the shoulder more of a spur off the acromion.   Past Medical History:  Diagnosis Date  . Anxiety   . CHF (congestive heart failure) ([place])   . Depression   . Diabetes mellitus without complication ([place])   . DVT (deep venous thrombosis) ([place])    2014  . Fibroid    age 40  . Gallstones   . Hypertension   . IBS (irritable bowel syndrome)   . OSA (obstructive sleep apnea)    on CPAP   . Ovarian cyst    1999; surgically removed  . 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 Termination of pregnancy    due to cardiac risk   Past Surgical History:  Procedure Laterality Date  . ABDOMINAL SURGERY     several  . CARDIAC CATHETERIZATION N/A 11/11/2015   Procedure: Right/Left Heart Cath and Coronary Angiography;  Surgeon: Troy Sine, MD;  Location: [place] CV LAB;  Service: Cardiovascular;  Laterality: N/A;  . CESAREAN SECTION    . CHOLECYSTECTOMY    . FOOT SURGERY  Right   . LEFT HEART CATHETERIZATION WITH CORONARY ANGIOGRAM N/A 02/26/2014   Procedure: LEFT HEART CATHETERIZATION WITH CORONARY ANGIOGRAM;  Surgeon: Jettie Booze, MD;  Location: [facility] CATH LAB;  Service: Cardiovascular;  Laterality: N/A;  . TUBAL LIGATION  1999   Social History  Substance Use Topics  . Smoking status: Never Smoker  . Smokeless tobacco: Never Used  . Alcohol use No   Allergies  Allergen Reactions  . Vancomycin Other (See Comments)    "did something to my kidneys"; went into kidney failure  . Aspirin Other (See Comments)    Wheezing; but patient still takes if she needs to  . Ciprofloxacin Itching and Rash  . Sulfa Antibiotics Itching and Rash   Family History  Problem Relation Age of Onset  . Emphysema Maternal Grandmother     smoked  . Allergies Daughter   . Heart disease Maternal Grandmother 32    MI  . Rheum arthritis Mother    workup for rheumatoid arthritis has not been done that patient has had elevated ESR previously.  Past medical history, social, surgical and family history all reviewed in electronic medical record.   Review of Systems: No headache, visual changes, nausea, vomiting, diarrhea, constipation, dizziness, abdominal pain, skin rash, fevers, chills, night sweats, weight loss, swollen lymph nodes, body aches, joint swelling, muscle aches, chest pain, shortness of breath, mood changes.  Objective  There were no vitals taken for this visit.  General: No apparent distress alert and oriented x3 mood and affect normal, dressed appropriately.  HEENT: Pupils equal, extraocular movements intact  Respiratory: [name] speak in full sentences and does not appear short of breath  Cardiovascular: No lower extremity edema, non tender, no erythema  Skin: Warm dry intact with no signs of infection or rash on extremities or on axial skeleton.  Abdomen: Soft nontender  Neuro: Cranial nerves II through XII are intact, neurovascularly intact in all  extremities with 2+ DTRs and 2+ pulses.  Lymph: No lymphadenopathy of posterior or anterior cervical chain or axillae bilaterally.  Gait normal with good balance and coordination.  MSK:  Non tender with full range of motion and good stability and symmetric strength and tone of  elbows, wrist, hip, and knee,  ankles bilaterally.   Shoulder: Right Inspection reveals no abnormalities, atrophy or asymmetry. Palpation is normal with no tenderness over AC joint or bicipital groove. ROM is full in all planes passively. Rotator cuff strength normal throughout. Negative impingement signs Speeds and Yergason's tests normal. Positive labral pathology still present Normal scapular function observed. No painful arc and no drop arm sign. No apprehension sign Contralateral shoulder unremarkable  no change from previous exam  Osteopathic findings Cervical C2 flexed rotated and side bent right C4 flexed rotated and side bent left C6 flexed rotated and side bent right  Thoracic T1 extended rotated and side bent right with elevated first rib T3 extended rotated and side bent left  Procedure: Real-time Ultrasound Guided Injection of right glenohumeral joint Device: GE Logiq E  Ultrasound guided injection is preferred based studies that show increased duration, increased effect, greater accuracy, decreased procedural pain, increased response rate with ultrasound guided versus blind injection.  Verbal informed consent obtained.  Time-out conducted.  Noted no overlying erythema, induration, or other signs of local infection.  Skin prepped in a sterile fashion.  Local anesthesia: Topical Ethyl chloride.  With sterile technique and under real time ultrasound guidance:  Joint visualized.  23g 1  inch needle inserted posterior approach. Pictures taken for needle placement. Patient did have injection of 2 cc of 1% lidocaine, 2 cc of 0.5% Marcaine, and 1.0 cc of Kenalog 40 mg/dL. Completed without  difficulty  Pain immediately resolved suggesting accurate placement of the medication.  Advised to call if fevers/chills, erythema, induration, drainage, or persistent bleeding.  Images permanently stored and available for review in the ultrasound unit.  Impression: Technically successful ultrasound guided injection.      Impression and Recommendations:     This case required medical decision making of moderate complexity.

## 2016-01-14 ENCOUNTER — Other Ambulatory Visit: Payer: 59

## 2016-01-14 ENCOUNTER — Ambulatory Visit: Payer: 59 | Admitting: Family Medicine

## 2016-01-21 ENCOUNTER — Telehealth: Payer: Self-pay | Admitting: Internal Medicine

## 2016-01-22 NOTE — Telephone Encounter (Signed)
Pt states she is having problems with IBS, abdominal pain. Pt would like to be seen sooner than 1st available. Pt scheduled to see Ellouise Newer PA 01/29/16@2 :15pm. Pt aware of appt.

## 2016-01-29 ENCOUNTER — Ambulatory Visit (INDEPENDENT_AMBULATORY_CARE_PROVIDER_SITE_OTHER): Payer: 59 | Admitting: Physician Assistant

## 2016-01-29 ENCOUNTER — Encounter: Payer: Self-pay | Admitting: Physician Assistant

## 2016-01-29 VITALS — BP 124/84 | HR 84 | Ht 65.0 in | Wt 240.2 lb

## 2016-01-29 DIAGNOSIS — Z8719 Personal history of other diseases of the digestive system: Secondary | ICD-10-CM

## 2016-01-29 DIAGNOSIS — R1084 Generalized abdominal pain: Secondary | ICD-10-CM | POA: Diagnosis not present

## 2016-01-29 DIAGNOSIS — R1013 Epigastric pain: Secondary | ICD-10-CM | POA: Diagnosis not present

## 2016-01-29 DIAGNOSIS — R197 Diarrhea, unspecified: Secondary | ICD-10-CM | POA: Diagnosis not present

## 2016-01-29 MED ORDER — NA SULFATE-K SULFATE-MG SULF 17.5-3.13-1.6 GM/177ML PO SOLN: 1.0000 | ORAL | 0 refills | Status: DC

## 2016-01-29 MED ORDER — PANTOPRAZOLE SODIUM 20 MG PO TBEC: 20.0000 mg | DELAYED_RELEASE_TABLET | Freq: Every day | ORAL | 3 refills | Status: DC

## 2016-01-29 MED ORDER — COLESEVELAM HCL 625 MG PO TABS: 625.0000 mg | ORAL_TABLET | Freq: Two times a day (BID) | ORAL | 3 refills | Status: DC

## 2016-01-29 MED ORDER — DICYCLOMINE HCL 10 MG PO CAPS: 20.0000 mg | ORAL_CAPSULE | Freq: Three times a day (TID) | ORAL | 3 refills | Status: DC

## 2016-01-29 NOTE — Progress Notes (Addendum)
Chief Complaint: Dyspepsia, Generalized abdominal cramping, Diarrhea  HPI:  [name] is a 40 y/o [place] American female, with past medical history significant for CHF with last ejection fraction noted 25-30% on 11/10/15, anxiety, depression, diabetes, IBS and OSA on CPAP,  who returns to clinic today for a new complaint of dyspepsia, generalized abdominal cramping and diarrhea .      Today, the patient describes that she used to follow with [facility] and was diagnosed with IBS in 2008. At that time she was started on WelChol 3 times a day and Dicyclomine 3 times a day which seemed to "help her as much as it could", although she did continue with symptoms at that time of diarrhea and dyspepsia. She recently ran out of these medications and has been experiencing severe symptoms of intermittent episodes of nausea and vomiting, typically worse on an empty stomach as well as diarrhea which is urgent and immediately after eating meals, "warmed food" seems to exacerbate the symptoms. Patient also describes a generalized abdominal cramping which proceeds bowel movements and is relieved afterwards, although it can take her an hour of trips in and out of the bathroom to feel completely relieved. It is to the point where the patient is somewhat scared to eat due to her symptoms afterwards.  Patient denies previous colonoscopy, fever, chills, blood in her stool, melena, weight loss, fatigue, anorexia, heartburn, reflux or symptoms that awaken her at night.   Past Medical History:  Diagnosis Date  . Anxiety   . CHF (congestive heart failure) ([place])   . Depression   . Diabetes mellitus without complication ([place])   . DVT (deep venous thrombosis) ([place])    2014  . Fibroid    age 35  . Gallstones   . Hypertension   . IBS (irritable bowel syndrome)   . OSA (obstructive sleep apnea)    on CPAP   . Ovarian cyst    1999; surgically removed  . 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  Termination of pregnancy    due to cardiac risk    Past Surgical History:  Procedure Laterality Date  . ABDOMINAL SURGERY     several  . CARDIAC CATHETERIZATION N/A 11/11/2015   Procedure: Right/Left Heart Cath and Coronary Angiography;  Surgeon: Troy Sine, MD;  Location: [facility];  Service: Cardiovascular;  Laterality: N/A;  . CESAREAN SECTION    . CHOLECYSTECTOMY    . FOOT SURGERY Right   . LEFT HEART CATHETERIZATION WITH CORONARY ANGIOGRAM N/A 02/26/2014   Procedure: LEFT HEART CATHETERIZATION WITH CORONARY ANGIOGRAM;  Surgeon: Jettie Booze, MD;  Location: [facility] CATH [facility];  Service: Cardiovascular;  Laterality: N/A;  . TUBAL LIGATION  1999    Current Outpatient Prescriptions  Medication Sig Dispense Refill  . albuterol (PROVENTIL HFA;VENTOLIN HFA) 108 (90 Base) MCG/ACT inhaler Inhale 2 puffs into the lungs every 6 (six) hours as needed for wheezing or shortness of breath. 1 Inhaler 0  . Armodafinil (NUVIGIL) 250 MG tablet Take 250 mg by mouth daily.    . carvedilol (COREG) 25 MG tablet Take 1 tablet (25 mg total) by mouth 2 (two) times daily with a meal. 180 tablet 3  . colesevelam (WELCHOL) 625 MG tablet Take 1 tablet (625 mg total) by mouth 3 (three) times daily with meals. 270 tablet 1  . cyclobenzaprine (FLEXERIL) 10 MG tablet Take 1 tablet (10 mg total) by mouth 3 (three) times daily as needed for  muscle spasms. 30 tablet 0  . diazepam (VALIUM) 10 MG tablet Take 10 mg by mouth daily.  3  . Diclofenac Sodium 2 % SOLN Apply 1 pump twice daily. 112 g 3  . fluticasone (FLONASE) 50 MCG/ACT nasal spray Place 1 spray into both nostrils daily.    . furosemide (LASIX) 40 MG tablet Take 2 tablets (80 mg total) by mouth 2 (two) times daily. 120 tablet 12  . gabapentin (NEURONTIN) 100 MG capsule Take 100 mg by mouth daily as needed. For pain    . Insulin Pen Needle (PEN NEEDLES) 32G X 4 MM MISC Inject 1 pen into the skin daily as needed (to administer insulin.). 100 each 3    . ivabradine (CORLANOR) 5 MG TABS tablet Take 1 tablet (5 mg total) by mouth 2 (two) times daily with a meal. 60 tablet 6  . Liraglutide (VICTOZA) 18 MG/3ML SOPN Inject 0.3 mLs (1.8 mg total) into the skin daily. 27 mL 1  . meloxicam (MOBIC) 15 MG tablet Take 1 tablet (15 mg total) by mouth daily. 30 tablet 0  . potassium chloride SA (K-DUR,KLOR-CON) 20 MEQ tablet Take 2 tablets (40 mEq total) by mouth 2 (two) times daily. 124 tablet 12  . sacubitril-valsartan (ENTRESTO) 49-51 MG Take 1 tablet by mouth 2 (two) times daily. 60 tablet 11  . spironolactone (ALDACTONE) 25 MG tablet Take 1 tablet (25 mg total) by mouth daily. 90 tablet 3  . venlafaxine XR (EFFEXOR-XR) 75 MG 24 hr capsule Take 225 mg by mouth daily with breakfast.    . Vitamin D, Ergocalciferol, (DRISDOL) 50000 units CAPS capsule Take 1 capsule (50,000 Units total) by mouth every 7 (seven) days. 8 capsule 0   No current facility-administered medications for this visit.     Allergies as of 01/29/2016 - Review Complete 01/29/2016  Allergen Reaction Noted  . Vancomycin Other (See Comments) 05/26/2012  . Aspirin Other (See Comments) 05/26/2012  . Ciprofloxacin Itching and Rash 05/26/2012  . Sulfa antibiotics Itching and Rash 12/28/2012    Family History  Problem Relation Age of Onset  . Emphysema Maternal Grandmother     smoked  . Heart disease Maternal Grandmother 66    MI  . Rheum arthritis Mother   . Allergies Daughter   . Colon cancer Neg Hx     Social History   Social History  . Marital status: Married    Spouse name: N/A  . Number of children: 1  . Years of education: N/A   Occupational History  . stay at home mom    Social History Main Topics  . Smoking status: Never Smoker  . Smokeless tobacco: Never Used  . Alcohol use No  . Drug use: No  . Sexual activity: Not Currently   Other Topics Concern  . Not on file   Social History Narrative  . No narrative on file    Review of Systems:     Constitutional: No weight loss, fever, chills, weakness or fatigue HEENT: Eyes: No Change in vision               Ears, Nose, Throat:  No change in hearing or congestion Skin: No rash or itching Cardiovascular: No chest pain, chest pressure or palpitations Respiratory: No SOB or cough Gastrointestinal: See HPI and otherwise negative Genitourinary: No dysuria or change in urinary frequency Neurological: No headache, dizziness or syncope Musculoskeletal: No new muscle or joint pain Hematologic: No bleeding or bruising Psychiatric: No history of depression or  anxiety   Physical Exam:  Vital signs: BP 124/84   Pulse 84   Ht 5\' 5"  (1.651 m)   Wt 240 lb 4 oz (109 kg)   BMI 39.98 kg/m   General:   Pleasant Obese African-American female appears to be in NAD, Well developed, Well nourished, alert and cooperative Head:  Normocephalic and atraumatic. Eyes:   PEERL, EOMI. No icterus. Conjunctiva pink. Ears:  Normal auditory acuity. Neck:  Supple Throat: Oral cavity and pharynx without inflammation, swelling or lesion. Teeth in good condition. Lungs: Respirations even and unlabored. Lungs clear to auscultation bilaterally.   No wheezes, crackles, or rhonchi.  Heart: Normal S1, S2. No MRG. Regular rate and rhythm. No peripheral edema, cyanosis or pallor.  Abdomen:  Soft, nondistended, nontender. No rebound or guarding. Normal bowel sounds. No appreciable masses or hepatomegaly. Rectal:  Not performed.  Msk:  Symmetrical without gross deformities Extremities:  Without edema, no deformity or joint abnormality. Normal ROM Neurologic:  Alert and  oriented x4;  grossly normal neurologically.  Skin:   Dry and intact without significant lesions or rashes. Psychiatric: Oriented to person, place and time. Demonstrates good judgement and reason without abnormal affect or behaviors.  Most recent LABS: CBC    Component Value Date/Time   WBC 6.6 11/12/2015 0333   RBC 4.11 11/12/2015 0333   HGB  11.3 (L) 11/12/2015 0333   HCT 36.8 11/12/2015 0333   PLT 207 11/12/2015 0333   MCV 89.5 11/12/2015 0333   MCH 27.5 11/12/2015 0333   MCHC 30.7 11/12/2015 0333   RDW 14.0 11/12/2015 0333   LYMPHSABS 2.2 10/15/2014 1151   MONOABS 0.5 10/15/2014 1151   EOSABS 0.1 10/15/2014 1151   BASOSABS 0.1 10/15/2014 1151    CMP     Component Value Date/Time   NA 140 12/05/2015 1155   K 3.9 12/05/2015 1155   CL 106 12/05/2015 1155   CO2 24 12/05/2015 1155   GLUCOSE 82 12/05/2015 1155   BUN 12 12/05/2015 1155   CREATININE 0.82 12/05/2015 1155   CALCIUM 9.2 12/05/2015 1155   PROT 8.5 (H) 02/25/2014 1420   ALBUMIN 4.2 02/25/2014 1420   AST 29 02/25/2014 1420   ALT 20 02/25/2014 1420   ALKPHOS 123 (H) 02/25/2014 1420   BILITOT 0.3 02/25/2014 1420   GFRNONAA >60 11/12/2015 0333   GFRNONAA >89 01/18/2014 1621   GFRAA >60 11/12/2015 0333   GFRAA >89 01/18/2014 1621    Assessment: 1. Dyspepsia: Per patient this intermittently occurs, at least 2 or 3 times per week, somewhat worse on an empty stomach; consider relation to gastritis previously seen by EGD in 2015 versus IBS versus bile reflux versus H. pylori versus other 2. Generalized abdominal cramping: Typically worse before a bowel movement and relieved afterwards, suggesting IBS 3. Diarrhea: Patient reports liquid stools most of the time, typically urgent after eating any "warm food", worse recently 4. Previous diagnosis of IBS: By [facility] in 2008, per patient, we do not have records, no previous colonoscopy  Plan: 1. Patient has never had a colonoscopy to confirm diagnosis of irritable bowel syndrome, although this is strongly suspected clinically. Would recommend colonoscopy and repeat EGD at this time due to exacerbation of symptoms recently. Due to patient's CHF with ejection fraction less than 35% this procedure will need to be done in the hospital setting. Discussed risks, benefits, limitations and alternatives to these procedures.  These will be scheduled with Dr. Hilarie Fredrickson at his next available time in the hospital.  2. Refillel patient's WelChol to be used twice a day, patient alerted to titrate this if she develops constipation 3. Refilled dicyclomine 20 mg 3 times a day before meals 4. Refilled pantoprazole 20 mg once daily, 30-60 minutes before eating 5. Discussed a low FODMAP diet. Did provide the patient with information regarding this. Discussed that she should remain on this diet strictly for 6 weeks and then slowly add foods back in. 6. Recommend the patient start a daily probiotic such as Align, provided her with coupons for this. 7. Will request records from [facility] 8. Patient should follow with Dr. Hilarie Fredrickson after time of upcoming procedures for further recommendations   Ellouise Newer, PA-C [facility] 01/29/2016, 2:48 PM  Cc: Golden Circle, FNP   Addendum: Reviewed and agree with initial management. Jerene Bears, MD

## 2016-01-29 NOTE — Patient Instructions (Signed)
You have been scheduled for an endoscopy and colonoscopy. Please follow the written instructions given to you at your visit today. Please pick up your prep supplies at the pharmacy within the next 1-3 days. If you use inhalers (even only as needed), please bring them with you on the day of your procedure. Your physician has requested that you go to www.startemmi.com and enter the access code given to you at your visit today. This web site gives a general overview about your procedure. However, you should still follow specific instructions given to you by our office regarding your preparation for the procedure.  We have sent the following medications to your pharmacy for you to pick up at your convenience: Welchol 625 mg twice a day.  Dicyclomine 20 mg three times a day Pantoprazole 20 mg daily  We have given you a handout on a low FODMAP diet.  We have given you samples of Align.

## 2016-01-30 ENCOUNTER — Encounter: Payer: 59 | Admitting: Internal Medicine

## 2016-02-19 ENCOUNTER — Ambulatory Visit (INDEPENDENT_AMBULATORY_CARE_PROVIDER_SITE_OTHER): Payer: 59 | Admitting: Interventional Cardiology

## 2016-02-19 ENCOUNTER — Encounter: Payer: Self-pay | Admitting: Interventional Cardiology

## 2016-02-19 VITALS — BP 143/100 | HR 99 | Ht 66.0 in | Wt 239.6 lb

## 2016-02-19 DIAGNOSIS — I5042 Chronic combined systolic (congestive) and diastolic (congestive) heart failure: Secondary | ICD-10-CM | POA: Diagnosis not present

## 2016-02-19 DIAGNOSIS — G4733 Obstructive sleep apnea (adult) (pediatric): Secondary | ICD-10-CM

## 2016-02-19 DIAGNOSIS — I10 Essential (primary) hypertension: Secondary | ICD-10-CM | POA: Diagnosis not present

## 2016-02-19 DIAGNOSIS — O903 Peripartum cardiomyopathy: Secondary | ICD-10-CM

## 2016-02-19 MED ORDER — SACUBITRIL-VALSARTAN 97-103 MG PO TABS: 1.0000 | ORAL_TABLET | Freq: Two times a day (BID) | ORAL | 11 refills | Status: DC

## 2016-02-19 NOTE — Patient Instructions (Signed)
Medication Instructions:  Your physician has recommended you make the following change in your medication:  INCREASE Entresto 97/103 twice daily. An Rx has been sent to your pharmacy.  Labwork: Your physician recommends that you schedule a follow-up appointment in: 4 weeks ([facility])   Testing/Procedures: Your physician has requested that you have an echocardiogram. Echocardiography is a painless test that uses sound waves to create images of your heart. It provides your doctor with information about the size and shape of your heart and how well your heart's chambers and valves are working. This procedure takes approximately one hour. There are no restrictions for this procedure. (To be scheduled in December 2017)  Your physician recommends that you schedule a follow-up appointment in: 3-4 months with Dr.Smith   Any Other Special Instructions Will Be Listed Below (If Applicable).     If you need a refill on your cardiac medications before your next appointment, please call your pharmacy.

## 2016-02-19 NOTE — Progress Notes (Signed)
Cardiology Office Note    Date:  02/19/2016   ID:  [name], DOB March 28, 1976, MRN [id number]  PCP:  Mauricio Po, FNP  Cardiologist: Sinclair Grooms, MD   Chief Complaint  Patient presents with  . Congestive Heart Failure    History of Present Illness:  [name] is a 40 y.o. female f/u of hypertension, diabetes mellitus, IBS, depression, anxiety, OSA on CPAP, IBS, DVT not on anticoagulants, chronic combined systolic and diastolic CHF.Recent hospitalization for chest pain led to coronary angiography which revealed normal vessels, moderate elevation in pulmonary artery pressures, and LVEF 25-30%.  She is tolerating her current medical regimen without difficulty. At the end of medication dose intervals, she can feel her heart pounding. She denies syncope. No orthopnea. No peripheral edema. She denies chest pain.   Past Medical History:  Diagnosis Date  . Anxiety   . CHF (congestive heart failure) ([place])   . Depression   . Diabetes mellitus without complication ([place])   . DVT (deep venous thrombosis) ([place])    2014  . Fibroid    age 33  . Gallstones   . Hypertension   . IBS (irritable bowel syndrome)   . OSA (obstructive sleep apnea)    on CPAP   . Ovarian cyst    1999; surgically removed  . 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 Termination of pregnancy    due to cardiac risk    Past Surgical History:  Procedure Laterality Date  . ABDOMINAL SURGERY     several  . CARDIAC CATHETERIZATION N/A 11/11/2015   Procedure: Right/Left Heart Cath and Coronary Angiography;  Surgeon: Troy Sine, MD;  Location: [facility];  Service: Cardiovascular;  Laterality: N/A;  . CESAREAN SECTION    . CHOLECYSTECTOMY    . FOOT SURGERY Right   . LEFT HEART CATHETERIZATION WITH CORONARY ANGIOGRAM N/A 02/26/2014   Procedure: LEFT HEART CATHETERIZATION WITH CORONARY ANGIOGRAM;  Surgeon: Jettie Booze, MD;  Location[facility];  Service:  Cardiovascular;  Laterality: N/A;  . TUBAL LIGATION  1999    Current Medications: Outpatient Medications Prior to Visit  Medication Sig Dispense Refill  . albuterol (PROVENTIL HFA;VENTOLIN HFA) 108 (90 Base) MCG/ACT inhaler Inhale 2 puffs into the lungs every 6 (six) hours as needed for wheezing or shortness of breath. 1 Inhaler 0  . Armodafinil (NUVIGIL) 250 MG tablet Take 250 mg by mouth daily.    . carvedilol (COREG) 25 MG tablet Take 1 tablet (25 mg total) by mouth 2 (two) times daily with a meal. 180 tablet 3  . colesevelam (WELCHOL) 625 MG tablet Take 1 tablet (625 mg total) by mouth 3 (three) times daily with meals. 270 tablet 1  . cyclobenzaprine (FLEXERIL) 10 MG tablet Take 1 tablet (10 mg total) by mouth 3 (three) times daily as needed for muscle spasms. 30 tablet 0  . diazepam (VALIUM) 10 MG tablet Take 10 mg by mouth daily.  3  . Diclofenac Sodium 2 % SOLN Apply 1 pump twice daily. 112 g 3  . dicyclomine (BENTYL) 10 MG capsule Take 2 capsules (20 mg total) by mouth 4 (four) times daily -  before meals and at bedtime. 90 capsule 3  . fluticasone (FLONASE) 50 MCG/ACT nasal spray Place 1 spray into both nostrils daily.    Marland Kitchen gabapentin (NEURONTIN) 100 MG capsule Take 100 mg by mouth daily as needed. For pain    . Insulin  Pen Needle (PEN NEEDLES) 32G X 4 MM MISC Inject 1 pen into the skin daily as needed (to administer insulin.). 100 each 3  . ivabradine (CORLANOR) 5 MG TABS tablet Take 1 tablet (5 mg total) by mouth 2 (two) times daily with a meal. 60 tablet 6  . Liraglutide (VICTOZA) 18 MG/3ML SOPN Inject 0.3 mLs (1.8 mg total) into the skin daily. 27 mL 1  . Na Sulfate-K Sulfate-Mg Sulf 17.5-3.13-1.6 GM/180ML SOLN Take 1 kit by mouth as directed. 354 mL 0  . pantoprazole (PROTONIX) 20 MG tablet Take 1 tablet (20 mg total) by mouth daily. 30-60 mins before breakfast 30 tablet 3  . potassium chloride SA (K-DUR,KLOR-CON) 20 MEQ tablet Take 2 tablets (40 mEq total) by mouth 2 (two) times  daily. 124 tablet 12  . spironolactone (ALDACTONE) 25 MG tablet Take 1 tablet (25 mg total) by mouth daily. 90 tablet 3  . venlafaxine XR (EFFEXOR-XR) 75 MG 24 hr capsule Take 225 mg by mouth daily with breakfast.    . Vitamin D, Ergocalciferol, (DRISDOL) 50000 units CAPS capsule Take 1 capsule (50,000 Units total) by mouth every 7 (seven) days. 8 capsule 0  . sacubitril-valsartan (ENTRESTO) 49-51 MG Take 1 tablet by mouth 2 (two) times daily. 60 tablet 11  . colesevelam (WELCHOL) 625 MG tablet Take 1 tablet (625 mg total) by mouth 2 (two) times daily with a meal. (Patient not taking: Reported on 02/19/2016) 60 tablet 3  . furosemide (LASIX) 40 MG tablet Take 2 tablets (80 mg total) by mouth 2 (two) times daily. 120 tablet 12  . meloxicam (MOBIC) 15 MG tablet Take 1 tablet (15 mg total) by mouth daily. (Patient not taking: Reported on 02/19/2016) 30 tablet 0   No facility-administered medications prior to visit.      Allergies:   Vancomycin; Aspirin; Ciprofloxacin; and Sulfa antibiotics   Social History   Social History  . Marital status: Married    Spouse name: N/A  . Number of children: 1  . Years of education: N/A   Occupational History  . stay at home mom    Social History Main Topics  . Smoking status: Never Smoker  . Smokeless tobacco: Never Used  . Alcohol use No  . Drug use: No  . Sexual activity: Not Currently   Other Topics Concern  . None   Social History Narrative  . None     Family History:  The patient's family history includes Allergies in her daughter; Emphysema in her maternal grandmother; Heart disease (age of onset: 38) in her maternal grandmother; Rheum arthritis in her mother.   ROS:   Please see the history of present illness.    Excessive fatigue, nausea, vomiting, headache, abdominal pain, and change in appetite.  All other systems reviewed and are negative.   PHYSICAL EXAM:   VS:  BP (!) 143/100   Pulse 99   Ht 5' 6"  (1.676 m)   Wt 239 lb 9.6  oz (108.7 kg)   BMI 38.67 kg/m    GEN: Well nourished, well developed, in no acute distress . Morbid obesity. HEENT: normal  Neck: no JVD, carotid bruits, or masses Cardiac: RRR; no murmurs, rubs, or gallops,no edema  Respiratory:  clear to auscultation bilaterally, normal work of breathing GI: soft, nontender, nondistended, + BS MS: no deformity or atrophy  Skin: warm and dry, no rash Neuro:  Alert and Oriented x 3, Strength and sensation are intact Psych: euthymic mood, full affect  Wt Readings  from Last 3 Encounters:  02/19/16 239 lb 9.6 oz (108.7 kg)  01/29/16 240 lb 4 oz (109 kg)  12/13/15 237 lb (107.5 kg)      Studies/Labs Reviewed:   EKG:  EKG  Not performed.  Recent Labs: 11/08/2015: B Natriuretic Peptide 378.0 11/10/2015: Magnesium 2.1 11/12/2015: Hemoglobin 11.3; Platelets 207 12/05/2015: BUN 12; Creat 0.82; Potassium 3.9; Sodium 140   Lipid Panel    Component Value Date/Time   CHOL 160 11/09/2015 0526   TRIG 70 11/09/2015 0526   HDL 58 11/09/2015 0526   CHOLHDL 2.8 11/09/2015 0526   VLDL 14 11/09/2015 0526   LDLCALC 88 11/09/2015 0526    Additional studies/ records that were reviewed today include:  No recent laboratory data.    ASSESSMENT:    1. Chronic combined systolic and diastolic heart failure ([place])   2. OSA (obstructive sleep apnea)   3. Postpartum cardiomyopathy   4. Essential hypertension      PLAN:  In order of problems listed above:  1. No evidence of volume overload. Increase Entresto to 97/103 milligrams twice a day. Basic metabolic panel in 2-4 weeks. 2-D Doppler echocardiogram in December. Office follow-up in January. 2. C Pap is encouraged. 3. Not addressed other than as above. 4. Blood pressure is elevated, hopefully further increasing Entresto Megan help better control.    Medication Adjustments/Labs and Tests Ordered: Current medicines are reviewed at length with the patient today.  Concerns regarding medicines are outlined  above.  Medication changes, Labs and Tests ordered today are listed in the Patient Instructions below. There are no Patient Instructions on file for this visit.   Signed, Sinclair Grooms, MD  02/19/2016 4:55 PM    [facility] [address], [place], [place]  [address] Phone: [phone]; Fax: [phone]

## 2016-02-28 ENCOUNTER — Ambulatory Visit (INDEPENDENT_AMBULATORY_CARE_PROVIDER_SITE_OTHER): Payer: 59 | Admitting: Internal Medicine

## 2016-02-28 ENCOUNTER — Other Ambulatory Visit (INDEPENDENT_AMBULATORY_CARE_PROVIDER_SITE_OTHER): Payer: 59

## 2016-02-28 ENCOUNTER — Encounter: Payer: Self-pay | Admitting: Internal Medicine

## 2016-02-28 VITALS — BP 130/86 | HR 66 | Temp 98.2°F | Resp 14 | Ht 66.0 in | Wt 244.0 lb

## 2016-02-28 DIAGNOSIS — R1012 Left upper quadrant pain: Secondary | ICD-10-CM | POA: Diagnosis not present

## 2016-02-28 LAB — COMPREHENSIVE METABOLIC PANEL
ALT: 7 U/L (ref 0–35)
AST: 11 U/L (ref 0–37)
Albumin: 3.7 g/dL (ref 3.5–5.2)
Alkaline Phosphatase: 77 U/L (ref 39–117)
BUN: 8 mg/dL (ref 6–23)
CO2: 30 mEq/L (ref 19–32)
Calcium: 9.2 mg/dL (ref 8.4–10.5)
Chloride: 105 mEq/L (ref 96–112)
Creatinine, Ser: 0.91 mg/dL (ref 0.40–1.20)
GFR: 87.22 mL/min (ref >60.00)
Glucose, Bld: 104 mg/dL — ABNORMAL HIGH (ref 70–99)
Potassium: 4.1 mEq/L (ref 3.5–5.1)
Sodium: 141 mEq/L (ref 135–145)
Total Bilirubin: 0.6 mg/dL (ref 0.2–1.2)
Total Protein: 6.9 g/dL (ref 6.0–8.3)

## 2016-02-28 LAB — CBC
HCT: 36.6 % (ref 36.0–46.0)
Hemoglobin: 12.1 g/dL (ref 12.0–15.0)
MCHC: 33.2 g/dL (ref 30.0–36.0)
MCV: 86.5 fl (ref 78.0–100.0)
Platelets: 220 10*3/uL (ref 150.0–400.0)
RBC: 4.24 Mil/uL (ref 3.87–5.11)
RDW: 14.9 % (ref 11.5–15.5)
WBC: 7.1 10*3/uL (ref 4.0–10.5)

## 2016-02-28 LAB — LIPASE: Lipase: 28 U/L (ref 11.0–59.0)

## 2016-02-28 MED ORDER — PROMETHAZINE HCL 25 MG PO TABS: 25.0000 mg | ORAL_TABLET | Freq: Three times a day (TID) | ORAL | 0 refills | Status: DC | PRN

## 2016-02-28 MED ORDER — HYDROCODONE-ACETAMINOPHEN 5-325 MG PO TABS: 1.0000 | ORAL_TABLET | ORAL | 0 refills | Status: DC | PRN

## 2016-02-28 NOTE — Patient Instructions (Signed)
We are checking the labs today but we think you might have pancreas inflammation. The treatment for this is to take care of the pain and nausea and let the stomach rest.   We have sent in promethazine for the nausea which should work better than the zofran. We have given you the prescription for hydrocodone which can help with the pain.  If you are not able to keep down liquids or the pain cannot be controlled you may need to go to the ER for fluids or pain control.    Acute Pancreatitis Acute pancreatitis is a disease in which the pancreas becomes suddenly inflamed. The pancreas is a large gland located behind your stomach. The pancreas produces enzymes that help digest food. The pancreas also releases the hormones glucagon and insulin that help regulate blood sugar. Damage to the pancreas occurs when the digestive enzymes from the pancreas are activated and begin attacking the pancreas before being released into the intestine. Most acute attacks last a couple of days and can cause serious complications. Some people become dehydrated and develop low blood pressure. In severe cases, bleeding into the pancreas can lead to shock and can be life-threatening. The lungs, heart, and kidneys may fail. CAUSES  Pancreatitis can happen to anyone. In some cases, the cause is unknown. Most cases are caused by:  Alcohol abuse.  Gallstones. Other less common causes are:  Certain medicines.  Exposure to certain chemicals.  Infection.  Damage caused by an accident (trauma).  Abdominal surgery. SYMPTOMS   Pain in the upper abdomen that may radiate to the back.  Tenderness and swelling of the abdomen.  Nausea and vomiting. DIAGNOSIS  Your caregiver will perform a physical exam. Blood and stool tests may be done to confirm the diagnosis. Imaging tests may also be done, such as X-rays, CT scans, or an ultrasound of the abdomen. TREATMENT  Treatment usually requires a stay in the hospital. Treatment  may include:  Pain medicine.  Fluid replacement through an intravenous line (IV).  Placing a tube in the stomach to remove stomach contents and control vomiting.  Not eating for 3 or 4 days. This gives your pancreas a rest, because enzymes are not being produced that can cause further damage.  Antibiotic medicines if your condition is caused by an infection.  Surgery of the pancreas or gallbladder. HOME CARE INSTRUCTIONS   Follow the diet advised by your caregiver. This may involve avoiding alcohol and decreasing the amount of fat in your diet.  Eat smaller, more frequent meals. This reduces the amount of digestive juices the pancreas produces.  Drink enough fluids to keep your urine clear or pale yellow.  Only take over-the-counter or prescription medicines as directed by your caregiver.  Avoid drinking alcohol if it caused your condition.  Do not smoke.  Get plenty of rest.  Check your blood sugar at home as directed by your caregiver.  Keep all follow-up appointments as directed by your caregiver. SEEK MEDICAL CARE IF:   You do not recover as quickly as expected.  You develop new or worsening symptoms.  You have persistent pain, weakness, or nausea.  You recover and then have another episode of pain. SEEK IMMEDIATE MEDICAL CARE IF:   You are unable to eat or keep fluids down.  Your pain becomes severe.  You have a fever or persistent symptoms for more than 2 to 3 days.  You have a fever and your symptoms suddenly get worse.  Your skin or the  white part of your eyes turn yellow (jaundice).  You develop vomiting.  You feel dizzy, or you faint.  Your blood sugar is high (over 300 mg/dL). MAKE SURE YOU:   Understand these instructions.  Will watch your condition.  Will get help right away if you are not doing well or get worse.   This information is not intended to replace advice given to you by your health care provider. Make sure you discuss any  questions you have with your health care provider.   Document Released: 05/11/2005 Document Revised: 11/10/2011 Document Reviewed: 08/20/2011 Elsevier Interactive Patient Education [employer].

## 2016-02-28 NOTE — Progress Notes (Signed)
Pre visit review using our clinic review tool, if applicable. No additional management support is needed unless otherwise documented below in the visit note. 

## 2016-02-28 NOTE — Progress Notes (Signed)
   Subjective:    Patient ID: [name], female    DOB: 11/05/1975, 40 y.o.   MRN: [id number]  HPI The patient is a 40 YO female coming in for stomach pains. States she has a history of IBS. Has not tried anything for the symptoms other than her usual medications including bentyl and zofran which were not effective. The pain is in LUQ and severe with eating any food. She is vomiting and nauseous with the smell of food. She has stopped eating altogether for about 3-4 days but is drinking water and ginger ale. She has been throwing up no blood in vomit. No dizziness or lightheadedness. Bowels are usual with some diarrhea (more yellow and bilious looking than normal). No change to medicines recently. She stopped taking victoza at the onset about 2 weeks ago (with more nausea and stomach pain) but this did not help and symptoms have progressed.    Review of Systems  Constitutional: Positive for activity change and appetite change. Negative for chills, fatigue, fever and unexpected weight change.  HENT: Negative.   Eyes: Negative.   Respiratory: Negative.   Cardiovascular: Negative for chest pain, palpitations and leg swelling.  Gastrointestinal: Positive for abdominal distention, abdominal pain, diarrhea, nausea and vomiting. Negative for anal bleeding, blood in stool, constipation and rectal pain.  Skin: Negative.   Neurological: Negative.       Objective:   Physical Exam  Constitutional: She appears well-developed. She appears distressed.  Mild distress  HENT:  Head: Normocephalic and atraumatic.  Eyes: EOM are normal.  Neck: Normal range of motion.  Cardiovascular: Normal rate and regular rhythm.   Pulmonary/Chest: Effort normal. No respiratory distress. She has no wheezes. She has no rales.  Abdominal: Soft. She exhibits distension. She exhibits no mass. There is tenderness. There is no rebound and no guarding.  LUQ pain severe without rebound, lower abdomen with mild pain    Vitals:   02/28/16 1128  BP: 130/86  Pulse: 66  Resp: 14  Temp: 98.2 F (36.8 C)  TempSrc: Oral  SpO2: 94%  Weight: 244 lb (110.7 kg)  Height: 5\' 6"  (1.676 m)      Assessment & Plan:

## 2016-02-28 NOTE — Assessment & Plan Note (Signed)
With symptoms and nausea with vomiting concern for pancreatitis. She was on victoza which is known risk. She has stopped and asked her to stay off this medication. CBC, CMP, lipase stat and given rx for phenergan and hydrocodone for pain and nausea. If she is unable to tolerate pain or tolerate liquids she will go to ER. If not pancreatitis will try doubling PPI, if no resolution needs repeat visit or ER.

## 2016-03-01 ENCOUNTER — Emergency Department (HOSPITAL_COMMUNITY)
Admission: EM | Admit: 2016-03-01 | Discharge: 2016-03-01 | Disposition: A | Discharge status: Home / Self Care | Payer: 59 | Attending: Emergency Medicine | Admitting: Emergency Medicine

## 2016-03-01 ENCOUNTER — Encounter (HOSPITAL_COMMUNITY): Payer: Self-pay

## 2016-03-01 DIAGNOSIS — S39011A Strain of muscle, fascia and tendon of abdomen, initial encounter: Secondary | ICD-10-CM | POA: Insufficient documentation

## 2016-03-01 DIAGNOSIS — I5042 Chronic combined systolic (congestive) and diastolic (congestive) heart failure: Secondary | ICD-10-CM | POA: Diagnosis not present

## 2016-03-01 DIAGNOSIS — Z794 Long term (current) use of insulin: Secondary | ICD-10-CM | POA: Diagnosis not present

## 2016-03-01 DIAGNOSIS — Y939 Activity, unspecified: Secondary | ICD-10-CM | POA: Insufficient documentation

## 2016-03-01 DIAGNOSIS — Y999 Unspecified external cause status: Secondary | ICD-10-CM | POA: Insufficient documentation

## 2016-03-01 DIAGNOSIS — Y929 Unspecified place or not applicable: Secondary | ICD-10-CM | POA: Diagnosis not present

## 2016-03-01 DIAGNOSIS — Z79899 Other long term (current) drug therapy: Secondary | ICD-10-CM | POA: Diagnosis not present

## 2016-03-01 DIAGNOSIS — E119 Type 2 diabetes mellitus without complications: Secondary | ICD-10-CM | POA: Diagnosis not present

## 2016-03-01 DIAGNOSIS — S3991XA Unspecified injury of abdomen, initial encounter: Secondary | ICD-10-CM | POA: Diagnosis present

## 2016-03-01 DIAGNOSIS — R197 Diarrhea, unspecified: Secondary | ICD-10-CM

## 2016-03-01 DIAGNOSIS — X58XXXA Exposure to other specified factors, initial encounter: Secondary | ICD-10-CM | POA: Insufficient documentation

## 2016-03-01 DIAGNOSIS — R112 Nausea with vomiting, unspecified: Secondary | ICD-10-CM

## 2016-03-01 DIAGNOSIS — I110 Hypertensive heart disease with heart failure: Secondary | ICD-10-CM | POA: Diagnosis not present

## 2016-03-01 LAB — COMPREHENSIVE METABOLIC PANEL
ALT: 11 U/L — ABNORMAL LOW (ref 14–54)
AST: 18 U/L (ref 15–41)
Albumin: 3.9 g/dL (ref 3.5–5.0)
Alkaline Phosphatase: 80 U/L (ref 38–126)
Anion gap: 6 (ref 5–15)
BUN: <5 mg/dL — ABNORMAL LOW (ref 6–20)
CO2: 29 mmol/L (ref 22–32)
Calcium: 9.5 mg/dL (ref 8.9–10.3)
Chloride: 103 mmol/L (ref 101–111)
Creatinine, Ser: 0.91 mg/dL (ref 0.44–1.00)
GFR calc Af Amer: >60 mL/min (ref >60)
GFR calc non Af Amer: >60 mL/min (ref >60)
Glucose, Bld: 109 mg/dL — ABNORMAL HIGH (ref 65–99)
Potassium: 3.8 mmol/L (ref 3.5–5.1)
Sodium: 138 mmol/L (ref 135–145)
Total Bilirubin: 0.6 mg/dL (ref 0.3–1.2)
Total Protein: 7.2 g/dL (ref 6.5–8.1)

## 2016-03-01 LAB — URINE MICROSCOPIC-ADD ON

## 2016-03-01 LAB — CBC
HCT: 41.5 % (ref 36.0–46.0)
Hemoglobin: 13.1 g/dL (ref 12.0–15.0)
MCH: 28.4 pg (ref 26.0–34.0)
MCHC: 31.6 g/dL (ref 30.0–36.0)
MCV: 89.8 fL (ref 78.0–100.0)
Platelets: 236 10*3/uL (ref 150–400)
RBC: 4.62 MIL/uL (ref 3.87–5.11)
RDW: 13.4 % (ref 11.5–15.5)
WBC: 7.3 10*3/uL (ref 4.0–10.5)

## 2016-03-01 LAB — URINALYSIS, ROUTINE W REFLEX MICROSCOPIC
Bilirubin Urine: NEGATIVE
Glucose, UA: NEGATIVE mg/dL
Hgb urine dipstick: NEGATIVE
Ketones, ur: NEGATIVE mg/dL
Nitrite: NEGATIVE
Protein, ur: NEGATIVE mg/dL
Specific Gravity, Urine: 1.01 (ref 1.005–1.030)
pH: 7 (ref 5.0–8.0)

## 2016-03-01 LAB — LIPASE, BLOOD: Lipase: 24 U/L (ref 11–51)

## 2016-03-01 LAB — POC URINE PREG, ED: Preg Test, Ur: NEGATIVE

## 2016-03-01 MED ORDER — BISMUTH SUBSALICYLATE 262 MG/15ML PO SUSP
30.0000 mL | Freq: Once | ORAL | Status: AC
  Administered 2016-03-01: 30 mL via ORAL
  Filled 2016-03-01: qty 118

## 2016-03-01 MED ORDER — GI COCKTAIL ~~LOC~~
30.0000 mL | Freq: Once | ORAL | Status: AC
  Administered 2016-03-01: 30 mL via ORAL
  Filled 2016-03-01: qty 30

## 2016-03-01 MED ORDER — NAPROXEN 500 MG PO TABS: 500.0000 mg | ORAL_TABLET | Freq: Two times a day (BID) | ORAL | 0 refills | Status: AC

## 2016-03-01 NOTE — ED Provider Notes (Signed)
[facility] DEPT Provider Note   CSN: [id number] Arrival date & time: 03/01/16  0911     History   Chief Complaint Chief Complaint  Patient presents with  . Abdominal Pain  . Emesis    HPI [name] is a 40 y.o. female.  The history is provided by the patient.  Abdominal Pain   This is a new problem. Episode onset: 5 days ago with N/V/D. The problem occurs constantly. The problem has not changed since onset.The pain is associated with an unknown factor. The pain is located in the epigastric region. The pain is moderate. Associated symptoms include diarrhea, nausea and vomiting. Pertinent negatives include fever, hematochezia and melena. Nothing aggravates the symptoms. Nothing relieves the symptoms.    Past Medical History:  Diagnosis Date  . Anxiety   . CHF (congestive heart failure) ([place])   . Depression   . Diabetes mellitus without complication ([place])   . DVT (deep venous thrombosis) ([place])    2014  . Fibroid    age 55  . Gallstones   . Hypertension   . IBS (irritable bowel syndrome)   . OSA (obstructive sleep apnea)    on CPAP   . Ovarian cyst    1999; surgically removed  . 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 Termination of pregnancy    due to cardiac risk    Patient Active Problem List   Diagnosis Date Noted  . LUQ pain 02/28/2016  . Abnormal nuclear stress test   . SOB (shortness of breath) 11/09/2015  . Diabetes mellitus without complication ([place])   . Labral tear of shoulder 04/04/2015  . 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 Subacromial bursitis 07/16/2014  . Iliotibial band syndrome of right side 07/16/2014  . Chronic combined systolic and diastolic heart failure ([place]) 03/05/2014  . Closed low lateral malleolus fracture 10/23/2013  . Thoracic back pain 06/20/2013  . IBS (irritable bowel syndrome)   . Depression with anxiety 01/20/2013  . OSA (obstructive sleep apnea) 01/02/2013  . 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 Essential hypertension     Past Surgical History:  Procedure Laterality Date  . ABDOMINAL SURGERY     several  . CARDIAC CATHETERIZATION N/A 11/11/2015   Procedure: Right/Left Heart Cath and Coronary Angiography;  Surgeon: Troy Sine, MD;  Location: [facility];  Service: Cardiovascular;  Laterality: N/A;  . CESAREAN SECTION    . CHOLECYSTECTOMY    . FOOT SURGERY Right   . LEFT HEART CATHETERIZATION WITH CORONARY ANGIOGRAM N/A 02/26/2014   Procedure: LEFT HEART CATHETERIZATION WITH CORONARY ANGIOGRAM;  Surgeon: Jettie Booze, MD;  Location: [facility] CATH LAB;  Service: Cardiovascular;  Laterality: N/A;  . TUBAL LIGATION  1999    OB History    Gravida Para Term Preterm AB Living   2 1     1 1    SAB TAB Ectopic Multiple Live Births     1             Home Medications    Prior to Admission medications   Medication Sig Start Date End Date Taking? Authorizing Provider  albuterol (PROVENTIL HFA;VENTOLIN HFA) 108 (90 Base) MCG/ACT inhaler Inhale 2 puffs into the lungs every 6 (six) hours as needed for wheezing or shortness of breath. 01/10/16  Yes Golden Circle, FNP  Armodafinil (NUVIGIL) 250 MG tablet Take 250 mg by mouth daily.   Yes Historical Provider, MD  carvedilol (COREG) 25 MG tablet Take  1 tablet (25 mg total) by mouth 2 (two) times daily with a meal. 01/10/16  Yes Belva Crome, MD  colesevelam [facility]) 625 MG tablet Take 1 tablet (625 mg total) by mouth 3 (three) times daily with meals. 12/25/14  Yes Rowe Clack, MD  cyclobenzaprine (FLEXERIL) 10 MG tablet Take 1 tablet (10 mg total) by mouth 3 (three) times daily as needed for muscle spasms. 01/10/16  Yes Golden Circle, FNP  diazepam (VALIUM) 10 MG tablet Take 10 mg by mouth daily. 08/17/15  Yes Historical Provider, MD  Diclofenac Sodium 2 % SOLN Apply 1 pump twice daily. Patient taking differently: Apply 1 application topically as needed (for pain).  12/03/15  Yes Lyndal Pulley, DO  dicyclomine  (BENTYL) 10 MG capsule Take 2 capsules (20 mg total) by mouth 4 (four) times daily -  before meals and at bedtime. Patient taking differently: Take 10 mg by mouth 3 (three) times daily before meals.  01/29/16  Yes Levin Erp, PA  fluticasone [facility]) 50 MCG/ACT nasal spray Place 2 sprays into both nostrils daily as needed for allergies.    Yes Historical Provider, MD  furosemide (LASIX) 80 MG tablet Take 80 mg by mouth 2 (two) times daily. 01/19/16  Yes Historical Provider, MD  gabapentin (NEURONTIN) 100 MG capsule Take 100 mg by mouth daily as needed (for pain).    Yes Historical Provider, MD  HYDROcodone-acetaminophen (NORCO/VICODIN) 5-325 MG tablet Take 1 tablet by mouth every 4 (four) hours as needed for moderate pain. 02/28/16  Yes Hoyt Koch, MD  ivabradine (CORLANOR) 5 MG TABS tablet Take 1 tablet (5 mg total) by mouth 2 (two) times daily with a meal. 12/05/15  Yes Belva Crome, MD  ondansetron (ZOFRAN) 4 MG tablet Take 4 mg by mouth every 6 (six) hours as needed for nausea/vomiting. 01/14/16  Yes Historical Provider, MD  pantoprazole (PROTONIX) 20 MG tablet Take 1 tablet (20 mg total) by mouth daily. 30-60 mins before breakfast Patient taking differently: Take 40 mg by mouth 2 (two) times daily.  01/29/16  Yes Levin Erp, PA  Probiotic Product (ALIGN) 4 MG CAPS Take 4 mg by mouth daily.   Yes Historical Provider, MD  promethazine (PHENERGAN) 25 MG tablet Take 1 tablet (25 mg total) by mouth every 8 (eight) hours as needed for nausea or vomiting. 02/28/16  Yes Hoyt Koch, MD  sacubitril-valsartan (ENTRESTO) 97-103 MG Take 1 tablet by mouth 2 (two) times daily. 02/19/16  Yes Belva Crome, MD  spironolactone (ALDACTONE) 25 MG tablet Take 1 tablet (25 mg total) by mouth daily. 11/20/15  Yes Brittainy Erie Noe, PA-C  venlafaxine XR (EFFEXOR-XR) 75 MG 24 hr capsule Take 225 mg by mouth daily with breakfast.   Yes Historical Provider, MD  Vitamin D, Ergocalciferol,  (DRISDOL) 50000 units CAPS capsule Take 1 capsule (50,000 Units total) by mouth every 7 (seven) days. Patient taking differently: Take 50,000 Units by mouth every Wednesday.  01/10/16  Yes Golden Circle, FNP  Insulin Pen Needle (PEN NEEDLES) 32G X 4 MM MISC Inject 1 pen into the skin daily as needed (to administer insulin.). Patient not taking: Reported on 03/01/2016 01/08/15   Rowe Clack, MD  Liraglutide (VICTOZA) 18 MG/3ML SOPN Inject 0.3 mLs (1.8 mg total) into the skin daily. Patient not taking: Reported on 03/01/2016 02/15/15   Golden Circle, FNP  Na Sulfate-K Sulfate-Mg Sulf 17.5-3.13-1.6 GM/180ML SOLN Take 1 kit by mouth as directed. Patient  not taking: Reported on 03/01/2016 01/29/16   Levin Erp, PA  potassium chloride SA (K-DUR,KLOR-CON) 20 MEQ tablet Take 2 tablets (40 mEq total) by mouth 2 (two) times daily. Patient not taking: Reported on 03/01/2016 02/17/13   Lelon Perla, MD    Family History Family History  Problem Relation Age of Onset  . Emphysema Maternal Grandmother     smoked  . Heart disease Maternal Grandmother 20    MI  . Rheum arthritis Mother   . Allergies Daughter   . Colon cancer Neg Hx     Social History Social History  Substance Use Topics  . Smoking status: Never Smoker  . Smokeless tobacco: Never Used  . Alcohol use No     Allergies   Vancomycin; Aspirin; Ciprofloxacin; and Sulfa antibiotics   Review of Systems Review of Systems  Constitutional: Negative for fever.  Gastrointestinal: Positive for abdominal pain, diarrhea, nausea and vomiting. Negative for hematochezia and melena.  All other systems reviewed and are negative.    Physical Exam Updated Vital Signs BP 133/90   Pulse 91   Temp 98 F (36.7 C) (Oral)   Resp 18   Ht 5' 6"  (1.676 m)   Wt 241 lb (109.3 kg)   LMP 02/09/2016   SpO2 100%   BMI 38.90 kg/m   Physical Exam  Constitutional: She is oriented to person, place, and time. She appears  well-developed and well-nourished. No distress.  HENT:  Head: Normocephalic.  Nose: Nose normal.  Eyes: Conjunctivae are normal.  Neck: Neck supple. No tracheal deviation present.  Cardiovascular: Normal rate, regular rhythm and normal heart sounds.   Pulmonary/Chest: Effort normal and breath sounds normal. No respiratory distress.  Abdominal: Soft. She exhibits no distension. There is tenderness (point tender directly below xyphoid on midline without extension to upper quadrants).  Neurological: She is alert and oriented to person, place, and time.  Skin: Skin is warm and dry.  Psychiatric: She has a normal mood and affect.  Vitals reviewed.    ED Treatments / Results  Labs (all labs ordered are listed, but only abnormal results are displayed) Labs Reviewed  COMPREHENSIVE METABOLIC PANEL - Abnormal; Notable for the following:       Result Value   Glucose, Bld 109 (*)    BUN <5 (*)    ALT 11 (*)    All other components within normal limits  URINALYSIS, ROUTINE W REFLEX MICROSCOPIC (NOT AT [facility]) - Abnormal; Notable for the following:    APPearance CLOUDY (*)    Leukocytes, UA TRACE (*)    All other components within normal limits  URINE MICROSCOPIC-ADD ON - Abnormal; Notable for the following:    Squamous Epithelial / LPF 6-30 (*)    Bacteria, UA MANY (*)    All other components within normal limits  LIPASE, BLOOD  CBC  POC URINE PREG, ED    EKG  EKG Interpretation None       Radiology No results found.  Procedures Procedures (including critical care time)  Medications Ordered in ED Medications - No data to display   Initial Impression / Assessment and Plan / ED Course  I have reviewed the triage vital signs and the nursing notes.  Pertinent labs & imaging results that were available during my care of the patient were reviewed by me and considered in my medical decision making (see chart for details).  Clinical Course    40 y.o. female presents with  N/V/D and stated history  of IBS. C/o increasing epigastric pain over same period. Point tender at central abdomen just below xyphoid. No blood in emesis or stool. Has tried 1x admin of pepto bismol and multiple other OTC agents without relief. Labs reassuring and exam is not c/w surgical abdomen. No indication for emergent imaging. Recommended scheduled bismuth salts and NSAIDs to help with pain. Doubt gastric etiology as GI cocktail had no effect, suspect abdominal muscle strain likely related to repeat vomiting episodes. Has phenergan at home for relief of nausea symptom. Plan to follow up with PCP as needed and return precautions discussed for worsening or new concerning symptoms.   Final Clinical Impressions(s) / ED Diagnoses   Final diagnoses:  Nausea vomiting and diarrhea  Strain of rectus abdominis muscle, initial encounter    New Prescriptions Discharge Medication List as of 03/01/2016 11:26 AM    START taking these medications   Details  naproxen (NAPROSYN) 500 MG tablet Take 1 tablet (500 mg total) by mouth 2 (two) times daily with a meal., Starting Sun 03/01/2016, Until Fri 03/06/2016, Print         Leo Grosser, MD 03/02/16 1233

## 2016-03-01 NOTE — ED Notes (Signed)
Pt is in stable condition upon d/c and ambulates from ED. 

## 2016-03-01 NOTE — ED Triage Notes (Signed)
Patient complains of ongoing epigastric pain with vomiting and diarrhea since Tuesday. Saw her MD on Friday and taking medication and nausea med with no relief

## 2016-03-01 NOTE — ED Notes (Signed)
Called pharmacy to verify and send pepto.

## 2016-03-16 ENCOUNTER — Other Ambulatory Visit: Payer: 59

## 2016-03-26 ENCOUNTER — Other Ambulatory Visit: Payer: 59 | Admitting: *Deleted

## 2016-03-26 DIAGNOSIS — I10 Essential (primary) hypertension: Secondary | ICD-10-CM

## 2016-03-26 DIAGNOSIS — I5042 Chronic combined systolic (congestive) and diastolic (congestive) heart failure: Secondary | ICD-10-CM

## 2016-03-27 LAB — BASIC METABOLIC PANEL
BUN: 9 mg/dL (ref 7–25)
CO2: 25 mmol/L (ref 20–31)
Calcium: 9.5 mg/dL (ref 8.6–10.2)
Chloride: 102 mmol/L (ref 98–110)
Creat: 0.9 mg/dL (ref 0.50–1.10)
Glucose, Bld: 84 mg/dL (ref 65–99)
Potassium: 4 mmol/L (ref 3.5–5.3)
Sodium: 139 mmol/L (ref 135–146)

## 2016-04-09 ENCOUNTER — Encounter (HOSPITAL_COMMUNITY): Payer: Self-pay | Admitting: *Deleted

## 2016-04-13 ENCOUNTER — Encounter: Payer: Self-pay | Admitting: Interventional Cardiology

## 2016-04-13 ENCOUNTER — Ambulatory Visit: Payer: 59 | Admitting: Internal Medicine

## 2016-04-13 ENCOUNTER — Other Ambulatory Visit: Payer: Self-pay | Admitting: *Deleted

## 2016-04-13 MED ORDER — IVABRADINE HCL 5 MG PO TABS: 5.0000 mg | ORAL_TABLET | Freq: Two times a day (BID) | ORAL | 3 refills | Status: DC

## 2016-04-21 ENCOUNTER — Ambulatory Visit (HOSPITAL_COMMUNITY): Payer: 59 | Admitting: Registered Nurse

## 2016-04-21 ENCOUNTER — Encounter (HOSPITAL_COMMUNITY): Payer: Self-pay

## 2016-04-21 ENCOUNTER — Encounter (HOSPITAL_COMMUNITY)
Admission: RE | Disposition: A | Discharge status: Home / Self Care | Payer: Self-pay | Source: Ambulatory Visit | Attending: Internal Medicine

## 2016-04-21 ENCOUNTER — Ambulatory Visit (HOSPITAL_COMMUNITY)
Admission: RE | Admit: 2016-04-21 | Discharge: 2016-04-21 | Disposition: A | Discharge status: Home / Self Care | Payer: 59 | Source: Ambulatory Visit | Attending: Internal Medicine | Admitting: Internal Medicine

## 2016-04-21 DIAGNOSIS — K3189 Other diseases of stomach and duodenum: Secondary | ICD-10-CM | POA: Insufficient documentation

## 2016-04-21 DIAGNOSIS — K529 Noninfective gastroenteritis and colitis, unspecified: Secondary | ICD-10-CM | POA: Insufficient documentation

## 2016-04-21 DIAGNOSIS — Z881 Allergy status to other antibiotic agents status: Secondary | ICD-10-CM | POA: Insufficient documentation

## 2016-04-21 DIAGNOSIS — R1030 Lower abdominal pain, unspecified: Secondary | ICD-10-CM | POA: Diagnosis present

## 2016-04-21 DIAGNOSIS — I509 Heart failure, unspecified: Secondary | ICD-10-CM | POA: Insufficient documentation

## 2016-04-21 DIAGNOSIS — E119 Type 2 diabetes mellitus without complications: Secondary | ICD-10-CM | POA: Diagnosis not present

## 2016-04-21 DIAGNOSIS — R1084 Generalized abdominal pain: Secondary | ICD-10-CM

## 2016-04-21 DIAGNOSIS — Z8719 Personal history of other diseases of the digestive system: Secondary | ICD-10-CM

## 2016-04-21 DIAGNOSIS — R1013 Epigastric pain: Secondary | ICD-10-CM

## 2016-04-21 DIAGNOSIS — K219 Gastro-esophageal reflux disease without esophagitis: Secondary | ICD-10-CM | POA: Diagnosis not present

## 2016-04-21 DIAGNOSIS — R197 Diarrhea, unspecified: Secondary | ICD-10-CM

## 2016-04-21 DIAGNOSIS — K5730 Diverticulosis of large intestine without perforation or abscess without bleeding: Secondary | ICD-10-CM | POA: Insufficient documentation

## 2016-04-21 DIAGNOSIS — I110 Hypertensive heart disease with heart failure: Secondary | ICD-10-CM | POA: Insufficient documentation

## 2016-04-21 DIAGNOSIS — G4733 Obstructive sleep apnea (adult) (pediatric): Secondary | ICD-10-CM | POA: Insufficient documentation

## 2016-04-21 DIAGNOSIS — Z6838 Body mass index (BMI) 38.0-38.9, adult: Secondary | ICD-10-CM | POA: Diagnosis not present

## 2016-04-21 HISTORY — DX: Dyspnea, unspecified: R06.00

## 2016-04-21 HISTORY — DX: Unspecified osteoarthritis, unspecified site: M19.90

## 2016-04-21 HISTORY — PX: ESOPHAGOGASTRODUODENOSCOPY (EGD) WITH PROPOFOL: SHX5813

## 2016-04-21 HISTORY — PX: COLONOSCOPY WITH PROPOFOL: SHX5780

## 2016-04-21 LAB — GLUCOSE, CAPILLARY: Glucose-Capillary: 87 mg/dL (ref 65–99)

## 2016-04-21 SURGERY — ESOPHAGOGASTRODUODENOSCOPY (EGD) WITH PROPOFOL
Anesthesia: Monitor Anesthesia Care

## 2016-04-21 MED ORDER — PROPOFOL 10 MG/ML IV BOLUS
INTRAVENOUS | Status: DC | PRN
  Administered 2016-04-21 (×2): 20 mg via INTRAVENOUS

## 2016-04-21 MED ORDER — PHENYLEPHRINE 40 MCG/ML (10ML) SYRINGE FOR IV PUSH (FOR BLOOD PRESSURE SUPPORT)
PREFILLED_SYRINGE | INTRAVENOUS | Status: AC
  Filled 2016-04-21: qty 10

## 2016-04-21 MED ORDER — LIDOCAINE 2% (20 MG/ML) 5 ML SYRINGE
INTRAMUSCULAR | Status: DC | PRN
  Administered 2016-04-21: 40 mg via INTRAVENOUS

## 2016-04-21 MED ORDER — PROPOFOL 500 MG/50ML IV EMUL
INTRAVENOUS | Status: DC | PRN
  Administered 2016-04-21: 50 ug/kg/min via INTRAVENOUS

## 2016-04-21 MED ORDER — LIDOCAINE 2% (20 MG/ML) 5 ML SYRINGE
INTRAMUSCULAR | Status: AC
  Filled 2016-04-21: qty 5

## 2016-04-21 MED ORDER — ONDANSETRON HCL 4 MG/2ML IJ SOLN
INTRAMUSCULAR | Status: DC | PRN
  Administered 2016-04-21: 4 mg via INTRAVENOUS

## 2016-04-21 MED ORDER — ONDANSETRON HCL 4 MG/2ML IJ SOLN
INTRAMUSCULAR | Status: AC
  Filled 2016-04-21: qty 2

## 2016-04-21 MED ORDER — SIMETHICONE 40 MG/0.6ML PO SUSP
ORAL | Status: AC
  Filled 2016-04-21: qty 0.6

## 2016-04-21 MED ORDER — FENTANYL CITRATE (PF) 100 MCG/2ML IJ SOLN
INTRAMUSCULAR | Status: AC
  Filled 2016-04-21: qty 2

## 2016-04-21 MED ORDER — FENTANYL CITRATE (PF) 100 MCG/2ML IJ SOLN
INTRAMUSCULAR | Status: DC | PRN
  Administered 2016-04-21 (×2): 50 ug via INTRAVENOUS

## 2016-04-21 MED ORDER — MIDAZOLAM HCL 2 MG/2ML IJ SOLN
INTRAMUSCULAR | Status: AC
  Filled 2016-04-21: qty 2

## 2016-04-21 MED ORDER — LACTATED RINGERS IV SOLN
INTRAVENOUS | Status: DC
Start: 2016-04-21 — End: 2016-04-21
  Administered 2016-04-21: 1000 mL via INTRAVENOUS
  Administered 2016-04-21: 09:00:00 via INTRAVENOUS

## 2016-04-21 MED ORDER — PROPOFOL 10 MG/ML IV BOLUS
INTRAVENOUS | Status: AC
  Filled 2016-04-21: qty 60

## 2016-04-21 MED ORDER — MIDAZOLAM HCL 5 MG/5ML IJ SOLN
INTRAMUSCULAR | Status: DC | PRN
  Administered 2016-04-21: 2 mg via INTRAVENOUS

## 2016-04-21 MED ORDER — SODIUM CHLORIDE 0.9 % IV SOLN: INTRAVENOUS | Status: DC

## 2016-04-21 MED ORDER — PHENYLEPHRINE 40 MCG/ML (10ML) SYRINGE FOR IV PUSH (FOR BLOOD PRESSURE SUPPORT)
PREFILLED_SYRINGE | INTRAVENOUS | Status: DC | PRN
  Administered 2016-04-21: 40 ug via INTRAVENOUS

## 2016-04-21 SURGICAL SUPPLY — 24 items

## 2016-04-21 NOTE — H&P (Signed)
HPI: [name] is a 40 year old female with history of CHF with last EF 25-30%, anxiety, depression, diabetes, sleep apnea who was seen in September to evaluate dyspepsia, lower abdominal crampy abdominal pain with loose stools. She previously was diagnosed with irritable bowel and started on WelChol and dicyclomine. This seems to help but not completely. She is continued to have intermittent episodes of nausea, occasional vomiting. She also has loose stools after most meals. WelChol seems to help but is inconsistent. Symptoms did worsen in 2008 after cholecystectomy. She reports issues with hemorrhoids which she reports intermittent bleeding, irritation, swelling and discomfort.  No report of dysphagia, odynophagia, heartburn. No blood in stool or melena other than scant leading occasionally which she has attributed to hemorrhoids. No prior colonoscopy  Past Medical History:  Diagnosis Date  . Anxiety   . Arthritis    right shoulder   . CHF (congestive heart failure) ([place])   . Depression   . Diabetes mellitus without complication ([place])   . Dyspnea    comes and goes intermittently mostly with exertion   . Fibroid    age 31  . Gallstones   . Hypertension   . IBS (irritable bowel syndrome)   . OSA (obstructive sleep apnea)    on CPAP   . Ovarian cyst    1999; surgically removed  . 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 Termination of pregnancy    due to cardiac risk    Past Surgical History:  Procedure Laterality Date  . ABDOMINAL SURGERY     several  . CARDIAC CATHETERIZATION N/A 11/11/2015   Procedure: Right/Left Heart Cath and Coronary Angiography;  Surgeon: Troy Sine, MD;  Location: [facility];  Service: Cardiovascular;  Laterality: N/A;  . CESAREAN SECTION    . CHOLECYSTECTOMY    . FOOT SURGERY Right   . LEFT HEART CATHETERIZATION WITH CORONARY ANGIOGRAM N/A 02/26/2014   Procedure: LEFT HEART CATHETERIZATION WITH CORONARY ANGIOGRAM;   Surgeon: Jettie Booze, MD;  Location: [facility] CATH LAB;  Service: Cardiovascular;  Laterality: N/A;  . TUBAL LIGATION  1999     (Not in an outpatient encounter)  Allergies  Allergen Reactions  . Vancomycin Other (See Comments)    "did something to my kidneys"; went into kidney failure  . Other     IV Contrast Dye - made patient go into renal failure in high doses   . Aspirin Other (See Comments)    Wheezing; but patient still takes if she needs to  . Ciprofloxacin Itching and Rash  . Sulfa Antibiotics Itching and Rash    Family History  Problem Relation Age of Onset  . Emphysema Maternal Grandmother     smoked  . Heart disease Maternal Grandmother 54    MI  . Rheum arthritis Mother   . Allergies Daughter   . Colon cancer Neg Hx     Social History  Substance Use Topics  . Smoking status: Never Smoker  . Smokeless tobacco: Never Used  . Alcohol use No    ROS: As per history of present illness, otherwise negative  BP (!) 144/81   Pulse 86   Temp 97.8 F (36.6 C) (Oral)   Resp 18   Ht 5\' 6"  (1.676 m)   Wt 237 lb (107.5 kg)   LMP 04/12/2016   SpO2 99%   BMI 38.25 kg/m  Gen: awake, alert, NAD HEENT: anicteric, op clear CV: RRR, no mrg Pulm: CTA b/l Abd:  soft, NT/ND, +BS throughout Ext: no c/c/e Neuro: nonfocal   RELEVANT LABS AND IMAGING: CBC    Component Value Date/Time   WBC 7.3 03/01/2016 1007   RBC 4.62 03/01/2016 1007   HGB 13.1 03/01/2016 1007   HCT 41.5 03/01/2016 1007   PLT 236 03/01/2016 1007   MCV 89.8 03/01/2016 1007   MCH 28.4 03/01/2016 1007   MCHC 31.6 03/01/2016 1007   RDW 13.4 03/01/2016 1007   LYMPHSABS 2.2 10/15/2014 1151   MONOABS 0.5 10/15/2014 1151   EOSABS 0.1 10/15/2014 1151   BASOSABS 0.1 10/15/2014 1151    CMP     Component Value Date/Time   NA 139 03/26/2016 1536   K 4.0 03/26/2016 1536   CL 102 03/26/2016 1536   CO2 25 03/26/2016 1536   GLUCOSE 84 03/26/2016 1536   BUN 9 03/26/2016 1536   CREATININE 0.90  03/26/2016 1536   CALCIUM 9.5 03/26/2016 1536   PROT 7.2 03/01/2016 1007   ALBUMIN 3.9 03/01/2016 1007   AST 18 03/01/2016 1007   ALT 11 (L) 03/01/2016 1007   ALKPHOS 80 03/01/2016 1007   BILITOT 0.6 03/01/2016 1007   GFRNONAA >60 03/01/2016 1007   GFRNONAA >89 01/18/2014 1621   GFRAA >60 03/01/2016 1007   GFRAA >89 01/18/2014 1621    ASSESSMENT/PLAN:  40 year old female with history of CHF with last EF 25-30%, anxiety, depression, diabetes, sleep apnea who was seen in September to evaluate dyspepsia, lower abdominal crampy abdominal pain with loose stools.   1. Dyspepsia/lower abd pain with loose stools -- EGD and colonoscopy today with MACTo evaluate her symptoms and help guide further management. The nature of the procedure, as well as the risks, benefits, and alternatives were carefully and thoroughly reviewed with the patient. Ample time for discussion and questions allowed. The patient understood, was satisfied, and agreed to proceed.

## 2016-04-21 NOTE — Op Note (Signed)
[facility] Patient Name: [name] Procedure Date: 04/21/2016 MRN: [id number] Attending MD: Jerene Bears , MD Date of Birth: 01-30-1976 CSN: [id number] Age: 40 Admit Type: Outpatient Procedure:                Colonoscopy Indications:              Lower abdominal pain, Chronic diarrhea Providers:                Lajuan Lines. Hilarie Fredrickson, MD, Zenon Mayo, RN, William Dalton, Technician Referring MD:             Ples Specter. Calone Medicines:                Monitored Anesthesia Care Complications:            No immediate complications. Estimated Blood Loss:     Estimated blood loss was minimal. Procedure:                Pre-Anesthesia Assessment:                           - Prior to the procedure, a History and Physical                            was performed, and patient medications and                            allergies were reviewed. The patient's tolerance of                            previous anesthesia was also reviewed. The risks                            and benefits of the procedure and the sedation                            options and risks were discussed with the patient.                            All questions were answered, and informed consent                            was obtained. Prior Anticoagulants: The patient has                            taken no previous anticoagulant or antiplatelet                            agents. ASA Grade Assessment: III - A patient with                            severe systemic disease. After reviewing the risks  and benefits, the patient was deemed in                            satisfactory condition to undergo the procedure.                           After obtaining informed consent, the colonoscope                            was passed under direct vision. Throughout the                            procedure, the patient's blood pressure, pulse, and            oxygen saturations were monitored continuously. The                            [id number] scope was introduced through                            the anus and advanced to the the terminal ileum.                            The colonoscopy was performed without difficulty.                            The patient tolerated the procedure well. The                            quality of the bowel preparation was good. The                            terminal ileum, ileocecal valve, appendiceal                            orifice, and rectum were photographed. Scope In: 9:58:14 AM Scope Out: 10:10:01 AM Scope Withdrawal Time: 0 hours 8 minutes 54 seconds  Total Procedure Duration: 0 hours 11 minutes 47 seconds  Findings:      The perianal and digital rectal examinations were normal.      The terminal ileum appeared normal.      A few small-mouthed diverticula were found in the sigmoid colon and       descending colon.      Normal mucosa was found in the entire colon. Biopsies for histology were       taken with a cold forceps from the right colon and left colon for       evaluation of microscopic colitis.      The retroflexed view of the distal rectum and anal verge was normal and       showed no anal or rectal abnormalities. Impression:               - The examined portion of the ileum was normal.                           - Mild diverticulosis in the sigmoid  colon and in                            the descending colon.                           - Normal mucosa in the entire examined colon.                            Biopsied.                           - The distal rectum and anal verge are normal on                            retroflexion view. Moderate Sedation:      N/A Recommendation:           - Patient has a contact number available for                            emergencies. The signs and symptoms of potential                            delayed complications were  discussed with the                            patient. Return to normal activities tomorrow.                            Written discharge instructions were provided to the                            patient.                           - Resume previous diet.                           - Continue present medications for now until                            pathology results available. Therapy for chronic                            loose stools and lower abdominal pain can be                            adjusted once pathology results reviewed.                           - Await pathology results.                           - Repeat colonoscopy in 10 years for screening  purposes.                           - Office follow-up with me or Ellouise Newer, PA-C                            for continuity after procedures. Procedure Code(s):        --- Professional ---                           [phone], Colonoscopy, flexible; with biopsy, single                            or multiple Diagnosis Code(s):        --- Professional ---                           R10.30, Lower abdominal pain, unspecified                           K52.9, Noninfective gastroenteritis and colitis,                            unspecified                           K57.30, Diverticulosis of large intestine without                            perforation or abscess without bleeding CPT copyright 2016 American Medical Association. All rights reserved. The codes documented in this report are preliminary and upon coder review may  be revised to meet current compliance requirements. Jerene Bears, MD 04/21/2016 10:15:44 AM This report has been signed electronically. Number of Addenda: 0

## 2016-04-21 NOTE — Anesthesia Postprocedure Evaluation (Signed)
Anesthesia Post Note  Patient: [name]  Procedure(s) Performed: Procedure(s) (LRB): ESOPHAGOGASTRODUODENOSCOPY (EGD) WITH PROPOFOL (N/A) COLONOSCOPY WITH PROPOFOL (N/A)  Patient location during evaluation: Endoscopy Anesthesia Type: MAC Level of consciousness: awake and alert, oriented and patient cooperative Pain management: pain level controlled Vital Signs Assessment: post-procedure vital signs reviewed and stable Respiratory status: spontaneous breathing, nonlabored ventilation and respiratory function stable Cardiovascular status: blood pressure returned to baseline and stable Postop Assessment: no signs of nausea or vomiting Anesthetic complications: no    Last Vitals:  Vitals:   04/21/16 0854 04/21/16 1018  BP: (!) 144/81 (!) 82/47  Pulse: 86 77  Resp: 18 18  Temp: 36.6 C     Last Pain:  Vitals:   04/21/16 0854  TempSrc: Oral                 Versa Craton,E. Khiem Gargis

## 2016-04-21 NOTE — Anesthesia Preprocedure Evaluation (Addendum)
Anesthesia Evaluation  Patient identified by MRN, date of birth, ID band Patient awake    Reviewed: Allergy & Precautions, NPO status , [name] Chart, lab work & pertinent test results  History of Anesthesia Complications Negative for: history of anesthetic complications  Airway Mallampati: I  TM Distance: >3 FB Neck ROM: Full    Dental  (+) Dental Advisory Given   Pulmonary sleep apnea and Continuous Positive Airway Pressure Ventilation ,    breath sounds clear to auscultation       Cardiovascular hypertension, Pt. on medications and Pt. on home beta blockers (-) angina Rhythm:Regular Rate:Normal  6/17 ECHO: EF 25-30%, mod MR, mod TR 6/17 cath: normal coronaries, EF 25-30%   Neuro/Psych Anxiety Depression negative neurological ROS     GI/Hepatic Neg liver ROS, GERD  Medicated and Controlled,  Endo/Other  diabetes (glu 87)Morbid obesity  Renal/GU negative Renal ROS     Musculoskeletal   Abdominal (+) + obese,   Peds  Hematology negative hematology ROS (+)   Anesthesia Other Findings   Reproductive/Obstetrics S/p BTL, LMP 10d ago                            Anesthesia Physical Anesthesia Plan  ASA: III  Anesthesia Plan: MAC   Post-op Pain Management:    Induction: Intravenous  Airway Management Planned: Natural Airway and Simple Face Mask  Additional Equipment:   Intra-op Plan:   Post-operative Plan: Extubation in OR  Informed Consent: I have reviewed the patients History and Physical, chart, labs and discussed the procedure including the risks, benefits and alternatives for the proposed anesthesia with the patient or authorized representative who has indicated his/her understanding and acceptance.   Dental advisory given  Plan Discussed with: CRNA and Surgeon  Anesthesia Plan Comments: (Plan routine monitors, MAC)        Anesthesia Quick Evaluation

## 2016-04-21 NOTE — Discharge Instructions (Signed)
YOU HAD AN ENDOSCOPIC PROCEDURE TODAY: Refer to the procedure report and other information in the discharge instructions given to you for any specific questions about what was found during the examination. If this information does not answer your questions, please call [facility] office at [phone] to clarify.  ° °YOU SHOULD EXPECT: Some feelings of bloating in the abdomen. Passage of more gas than usual. Walking can help get rid of the air that was put into your GI tract during the procedure and reduce the bloating. If you had a lower endoscopy (such as a colonoscopy or flexible sigmoidoscopy) you may notice spotting of blood in your stool or on the toilet paper. Some abdominal soreness may be present for a day or two, also. ° °DIET: Your first meal following the procedure should be a light meal and then it is ok to progress to your normal diet. A half-sandwich or bowl of soup is an example of a good first meal. Heavy or fried foods are harder to digest and may make you feel nauseous or bloated. Drink plenty of fluids but you should avoid alcoholic beverages for 24 hours. If you had a esophageal dilation, please see attached instructions for diet.   ° °ACTIVITY: Your care partner should take you home directly after the procedure. You should plan to take it easy, moving slowly for the rest of the day. You can resume normal activity the day after the procedure however YOU SHOULD NOT DRIVE, use power tools, machinery or perform tasks that involve climbing or major physical exertion for 24 hours (because of the sedation medicines used during the test).  ° °SYMPTOMS TO REPORT IMMEDIATELY: °A gastroenterologist can be reached at any hour. Please call [phone]  for any of the following symptoms:  °Following lower endoscopy (colonoscopy, flexible sigmoidoscopy) °Excessive amounts of blood in the stool  °Significant tenderness, worsening of abdominal pains  °Swelling of the abdomen that is new, acute  °Fever of 100° or  higher  °Following upper endoscopy (EGD, EUS, ERCP, esophageal dilation) °Vomiting of blood or coffee ground material  °New, significant abdominal pain  °New, significant chest pain or pain under the shoulder blades  °Painful or persistently difficult swallowing  °New shortness of breath  °Black, tarry-looking or red, bloody stools ° °FOLLOW UP:  °If any biopsies were taken you will be contacted by phone or by letter within the next 1-3 weeks. Call [phone]  if you have not heard about the biopsies in 3 weeks.  °Please also call with any specific questions about appointments or follow up tests. ° °

## 2016-04-21 NOTE — Op Note (Signed)
[facility] Patient Name: [name] Procedure Date: 04/21/2016 MRN: [id number] Attending MD: Jerene Bears , MD Date of Birth: June 25, 1975 CSN: [id number] Age: 40 Admit Type: Outpatient Procedure:                Upper GI endoscopy Indications:              Dyspepsia, Diarrhea Providers:                Lajuan Lines. Hilarie Fredrickson, MD, Zenon Mayo, RN, William Dalton, Technician Referring MD:             Ples Specter. [place]one Medicines:                Monitored Anesthesia Care Complications:            No immediate complications. Estimated Blood Loss:     Estimated blood loss was minimal. Procedure:                Pre-Anesthesia Assessment:                           - Prior to the procedure, a History and Physical                            was performed, and patient medications and                            allergies were reviewed. The patient's tolerance of                            previous anesthesia was also reviewed. The risks                            and benefits of the procedure and the sedation                            options and risks were discussed with the patient.                            All questions were answered, and informed consent                            was obtained. Prior Anticoagulants: The patient has                            taken no previous anticoagulant or antiplatelet                            agents. ASA Grade Assessment: III - A patient with                            severe systemic disease. After reviewing the risks  and benefits, the patient was deemed in                            satisfactory condition to undergo the procedure.                           After obtaining informed consent, the endoscope was                            passed under direct vision. Throughout the                            procedure, the patient's blood pressure, pulse, and   oxygen saturations were monitored continuously. The                            was introduced through the mouth, and advanced to                            the second part of duodenum. The upper GI endoscopy                            was accomplished without difficulty. The patient                            tolerated the procedure well. Scope In: Scope Out: Findings:      The examined esophagus was normal. Z-line regular at 40 cm.      Striped mildly erythematous mucosa without bleeding was found in the       gastric body and antrum. Biopsies were taken with a cold forceps for       histology and Helicobacter pylori testing (gastric body, antrum and       incisura).      The cardia and gastric fundus were normal on retroflexion.      The examined duodenum was normal. Biopsies for histology were taken with       a cold forceps for evaluation of celiac disease. Impression:               - Normal esophagus.                           - Erythematous mucosa in the gastric body and                            antrum. Biopsied.                           - Normal examined duodenum. Biopsied. Moderate Sedation:      N/A Recommendation:           - Patient has a contact number available for                            emergencies. The signs and symptoms of potential  delayed complications were discussed with the                            patient. Return to normal activities tomorrow.                            Written discharge instructions were provided to the                            patient.                           - Resume previous diet.                           - Continue present medications.                           - Await pathology results.                           - Perform a colonoscopy today. Procedure Code(s):        --- Professional ---                           [phone], Esophagogastroduodenoscopy, flexible,                            transoral;  with biopsy, single or multiple Diagnosis Code(s):        --- Professional ---                           K31.89, Other diseases of stomach and duodenum                           R10.13, Epigastric pain                           R19.7, Diarrhea, unspecified CPT copyright 2016 American Medical Association. All rights reserved. The codes documented in this report are preliminary and upon coder review may  be revised to meet current compliance requirements. Jerene Bears, MD 04/21/2016 9:54:02 AM This report has been signed electronically. Number of Addenda: 0

## 2016-04-21 NOTE — Transfer of Care (Signed)
Immediate Anesthesia Transfer of Care Note  Patient: [name]  Procedure(s) Performed: Procedure(s): ESOPHAGOGASTRODUODENOSCOPY (EGD) WITH PROPOFOL (N/A) COLONOSCOPY WITH PROPOFOL (N/A)  Patient Location: PACU  Anesthesia Type:MAC  Level of Consciousness:  sedated, patient cooperative and responds to stimulation  Airway & Oxygen Therapy:Patient Spontanous Breathing and Patient connected to face mask oxgen  Post-op Assessment:  Report given to PACU RN and Post -op Vital signs reviewed and stable  Post vital signs:  Reviewed and stable  Last Vitals:  Vitals:   04/21/16 0854  BP: (!) 144/81  Pulse: 86  Resp: 18  Temp: 123XX123 C    Complications: No apparent anesthesia complications

## 2016-04-22 ENCOUNTER — Encounter (HOSPITAL_COMMUNITY): Payer: Self-pay | Admitting: Internal Medicine

## 2016-04-22 ENCOUNTER — Encounter: Payer: Self-pay | Admitting: Family

## 2016-04-24 ENCOUNTER — Encounter: Payer: Self-pay | Admitting: Family

## 2016-04-24 ENCOUNTER — Other Ambulatory Visit: Payer: Self-pay | Admitting: Family Medicine

## 2016-04-24 ENCOUNTER — Other Ambulatory Visit: Payer: Self-pay | Admitting: Internal Medicine

## 2016-04-27 ENCOUNTER — Telehealth: Payer: Self-pay

## 2016-04-27 ENCOUNTER — Other Ambulatory Visit: Payer: Self-pay

## 2016-04-27 MED ORDER — COLESEVELAM HCL 625 MG PO TABS: 625.0000 mg | ORAL_TABLET | Freq: Two times a day (BID) | ORAL | 1 refills | Status: DC

## 2016-04-27 MED ORDER — COLESTIPOL HCL 1 G PO TABS: 2.0000 g | ORAL_TABLET | Freq: Every day | ORAL | 3 refills | Status: DC

## 2016-04-27 NOTE — Telephone Encounter (Signed)
Sent 90 day supply of Welchol

## 2016-04-28 ENCOUNTER — Other Ambulatory Visit: Payer: Self-pay | Admitting: *Deleted

## 2016-04-28 ENCOUNTER — Ambulatory Visit (INDEPENDENT_AMBULATORY_CARE_PROVIDER_SITE_OTHER): Payer: 59 | Admitting: Family

## 2016-04-28 ENCOUNTER — Encounter: Payer: Self-pay | Admitting: Family

## 2016-04-28 ENCOUNTER — Encounter: Payer: Self-pay | Admitting: Internal Medicine

## 2016-04-28 ENCOUNTER — Ambulatory Visit (INDEPENDENT_AMBULATORY_CARE_PROVIDER_SITE_OTHER): Payer: 59 | Admitting: Internal Medicine

## 2016-04-28 VITALS — BP 116/90 | HR 88 | Ht 66.0 in | Wt 240.5 lb

## 2016-04-28 VITALS — BP 112/84 | HR 80 | Temp 97.8°F | Resp 16 | Ht 66.0 in | Wt 240.8 lb

## 2016-04-28 DIAGNOSIS — J069 Acute upper respiratory infection, unspecified: Secondary | ICD-10-CM

## 2016-04-28 DIAGNOSIS — K9089 Other intestinal malabsorption: Secondary | ICD-10-CM | POA: Diagnosis not present

## 2016-04-28 MED ORDER — METHYLPREDNISOLONE ACETATE 80 MG/ML IJ SUSP
80.0000 mg | Freq: Once | INTRAMUSCULAR | Status: AC
  Administered 2016-04-28: 80 mg via INTRAMUSCULAR

## 2016-04-28 MED ORDER — PROMETHAZINE-CODEINE 6.25-10 MG/5ML PO SYRP: 5.0000 mL | ORAL_SOLUTION | Freq: Four times a day (QID) | ORAL | 0 refills | Status: DC | PRN

## 2016-04-28 MED ORDER — DICYCLOMINE HCL 20 MG PO TABS: 20.0000 mg | ORAL_TABLET | Freq: Two times a day (BID) | ORAL | 2 refills | Status: DC

## 2016-04-28 MED ORDER — PROMETHAZINE HCL 25 MG PO TABS: 25.0000 mg | ORAL_TABLET | Freq: Three times a day (TID) | ORAL | 0 refills | Status: DC | PRN

## 2016-04-28 MED ORDER — BUDESONIDE-FORMOTEROL FUMARATE 160-4.5 MCG/ACT IN AERO: 2.0000 | INHALATION_SPRAY | Freq: Two times a day (BID) | RESPIRATORY_TRACT | 3 refills | Status: DC

## 2016-04-28 NOTE — Patient Instructions (Addendum)
Thank you for choosing [facility].  SUMMARY AND INSTRUCTIONS:  Medication:  Please start Symbicort 2 inhalations twice daily.  Your prescription(s) have been submitted to your pharmacy or been printed and provided for you. Please take as directed and contact our office if you believe you are having problem(s) with the medication(s) or have any questions.  Follow up:  If your symptoms worsen or fail to improve, please contact our office for further instruction, or in case of emergency go directly to the emergency room at the closest medical facility.   General Recommendations:    Please drink plenty of fluids.  Get plenty of rest   Sleep in humidified air  Use saline nasal sprays  Netti pot   OTC Medications:  Decongestants - helps relieve congestion   Flonase (generic fluticasone) or Nasacort (generic triamcinolone) - please make sure to use the "cross-over" technique at a 45 degree angle towards the opposite eye as opposed to straight up the nasal passageway.   Sudafed (generic pseudoephedrine - Note this is the one that is available behind the pharmacy counter); Products with phenylephrine (-PE) may also be used but is often not as effective as pseudoephedrine.   If you have HIGH BLOOD PRESSURE - Coricidin HBP; AVOID any product that is -D as this contains pseudoephedrine which may increase your blood pressure.  Afrin (oxymetazoline) every 6-8 hours for up to 3 days.   Allergies - helps relieve runny nose, itchy eyes and sneezing   Claritin (generic loratidine), Allegra (fexofenidine), or Zyrtec (generic cyrterizine) for runny nose. These medications should not cause drowsiness.  Note - Benadryl (generic diphenhydramine) may be used however may cause drowsiness  Cough -   Delsym or Robitussin (generic dextromethorphan)  Expectorants - helps loosen mucus to ease removal   Mucinex (generic guaifenesin) as directed on the package.  Headaches / General  Aches   Tylenol (generic acetaminophen) - DO NOT EXCEED 3 grams (3,000 mg) in a 24 hour time period  Advil/Motrin (generic ibuprofen)   Sore Throat -   Salt water gargle   Chloraseptic (generic benzocaine) spray or lozenges / Sucrets (generic dyclonine)      Upper Respiratory Infection, Adult Most upper respiratory infections (URIs) are a viral infection of the air passages leading to the lungs. A URI affects the nose, throat, and upper air passages. The most common type of URI is nasopharyngitis and is typically referred to as "the common cold." URIs run their course and usually go away on their own. Most of the time, a URI does not require medical attention, but sometimes a bacterial infection in the upper airways can follow a viral infection. This is called a secondary infection. Sinus and middle ear infections are common types of secondary upper respiratory infections. Bacterial pneumonia can also complicate a URI. A URI can worsen asthma and chronic obstructive pulmonary disease (COPD). Sometimes, these complications can require emergency medical care and may be life threatening. What are the causes? Almost all URIs are caused by viruses. A virus is a type of germ and can spread from one person to another. What increases the risk? You may be at risk for a URI if:  You smoke.  You have chronic heart or lung disease.  You have a weakened defense (immune) system.  You are very young or very old.  You have nasal allergies or asthma.  You work in crowded or poorly ventilated areas.  You work in health care facilities or schools. What are the signs  or symptoms? Symptoms typically develop 2-3 days after you come in contact with a cold virus. Most viral URIs last 7-10 days. However, viral URIs from the influenza virus (flu virus) can last 14-18 days and are typically more severe. Symptoms may include:  Runny or stuffy (congested) nose.  Sneezing.  Cough.  Sore  throat.  Headache.  Fatigue.  Fever.  Loss of appetite.  Pain in your forehead, behind your eyes, and over your cheekbones (sinus pain).  Muscle aches. How is this diagnosed? Your health care provider may diagnose a URI by:  Physical exam.  Tests to check that your symptoms are not due to another condition such as:  Strep throat.  Sinusitis.  Pneumonia.  Asthma. How is this treated? A URI goes away on its own with time. It cannot be cured with medicines, but medicines may be prescribed or recommended to relieve symptoms. Medicines may help:  Reduce your fever.  Reduce your cough.  Relieve nasal congestion. Follow these instructions at home:  Take medicines only as directed by your health care provider.  Gargle warm saltwater or take cough drops to comfort your throat as directed by your health care provider.  Use a warm mist humidifier or inhale steam from a shower to increase air moisture. This may make it easier to breathe.  Drink enough fluid to keep your urine clear or pale yellow.  Eat soups and other clear broths and maintain good nutrition.  Rest as needed.  Return to work when your temperature has returned to normal or as your health care provider advises. You may need to stay home longer to avoid infecting others. You can also use a face mask and careful hand washing to prevent spread of the virus.  Increase the usage of your inhaler if you have asthma.  Do not use any tobacco products, including cigarettes, chewing tobacco, or electronic cigarettes. If you need help quitting, ask your health care provider. How is this prevented? The best way to protect yourself from getting a cold is to practice good hygiene.  Avoid oral or hand contact with people with cold symptoms.  Wash your hands often if contact occurs. There is no clear evidence that vitamin C, vitamin E, echinacea, or exercise reduces the chance of developing a cold. However, it is always  recommended to get plenty of rest, exercise, and practice good nutrition. Contact a health care provider if:  You are getting worse rather than better.  Your symptoms are not controlled by medicine.  You have chills.  You have worsening shortness of breath.  You have brown or red mucus.  You have yellow or brown nasal discharge.  You have pain in your face, especially when you bend forward.  You have a fever.  You have swollen neck glands.  You have pain while swallowing.  You have white areas in the back of your throat. Get help right away if:  You have severe or persistent:  Headache.  Ear pain.  Sinus pain.  Chest pain.  You have chronic lung disease and any of the following:  Wheezing.  Prolonged cough.  Coughing up blood.  A change in your usual mucus.  You have a stiff neck.  You have changes in your:  Vision.  Hearing.  Thinking.  Mood. This information is not intended to replace advice given to you by your health care provider. Make sure you discuss any questions you have with your health care provider. Document Released: 11/04/2000 Document Revised: 01/12/2016  Document Reviewed: 08/16/2013 Elsevier Interactive Patient Education  2017 [employer].

## 2016-04-28 NOTE — Patient Instructions (Signed)
Please discontinue Protonix  Discontinue Welchol. Instead, you should take colestipol  We have sent the following medications to your pharmacy for you to pick up at your convenience: Colestipol 2 grams once daily. Please make sure to take 2 hours apart from all other medications.  Continue Bentyl 20 mg twice daily with meals.  Please follow up with Dr Hilarie Fredrickson in 3 months.  If you are age 42 or older, your body mass index should be between 23-30. Your Body mass index is 38.82 kg/m. If this is out of the aforementioned range listed, please consider follow up with your Primary Care Provider.  If you are age 56 or younger, your body mass index should be between 19-25. Your Body mass index is 38.82 kg/m. If this is out of the aformentioned range listed, please consider follow up with your Primary Care Provider.

## 2016-04-28 NOTE — Progress Notes (Signed)
HPI: [name] is a 40 year old female with a past medical history of chronic diarrhea, history of cholecystectomy, CHF, diabetes, sleep apnea who is seen for follow-up. She was seen by Ellouise Newer, [facility] 01/29/2016 to evaluate dyspepsia, abdominal cramping and loose stools. This led to an upper endoscopy and colonoscopy which were performed recently on 04/21/2016.  EGD -- mildly striped erythematous mucosa in the gastric antrum which was biopsied. Otherwise normal exam. Gastric biopsies benign stomach showing chronic inactive gastritis. No H. Pylori. Duodenal biopsies normal. Colonoscopy -- to the terminal ileum. Normal TI. Few small mouth diverticula in the left colon. Otherwise normal colon. Random biopsies normal. No microscopic colitis  Her biggest complaint continues to be urgent loose stools occurring after eating. This is associated with branching mid and lower abdominal pain. Usually happens 20 minutes after eating. States that she will continue to have bowel movements until she feels empty. All symptoms worsened after gallbladder and were not present before. She was previously using WelChol 3 times a day with meals which seemed to help her loose stools but would result in constipation. She would then stop WelChol and symptoms would start over. She could never find the dose that seemed to work well for her continuously. She is using Bentyl 20 mg with meals.  She does not have heartburn. No trouble swallowing. Her Effexor and Ivabradine cause nausea and so she will occasionally use Zofran or Phenergan for this. Otherwise no nausea or vomiting.  Recently she has developed URI symptoms including right ear discomfort, sore throat, cough. She seen primary care after this visit to have the symptoms addressed.  Past Medical History:  Diagnosis Date  . Anxiety   . Arthritis    right shoulder   . CHF (congestive heart failure) ([place])   . Depression   . Diabetes mellitus without complication  ([place])   . Dyspnea    comes and goes intermittently mostly with exertion   . Fibroid    age 65  . Gallstones   . Hypertension   . IBS (irritable bowel syndrome)   . OSA (obstructive sleep apnea)    on CPAP   . Ovarian cyst    1999; surgically removed  . 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 Termination of pregnancy    due to cardiac risk    Past Surgical History:  Procedure Laterality Date  . ABDOMINAL SURGERY     several  . CARDIAC CATHETERIZATION N/A 11/11/2015   Procedure: Right/Left Heart Cath and Coronary Angiography;  Surgeon: Troy Sine, MD;  Location: [facility];  Service: Cardiovascular;  Laterality: N/A;  . CESAREAN SECTION    . CHOLECYSTECTOMY    . COLONOSCOPY WITH PROPOFOL N/A 04/21/2016   Procedure: COLONOSCOPY WITH PROPOFOL;  Surgeon: Jerene Bears, MD;  Location: [facility];  Service: Gastroenterology;  Laterality: N/A;  . ESOPHAGOGASTRODUODENOSCOPY (EGD) WITH PROPOFOL N/A 04/21/2016   Procedure: ESOPHAGOGASTRODUODENOSCOPY (EGD) WITH PROPOFOL;  Surgeon: Jerene Bears, MD;  Location: [facility];  Service: Gastroenterology;  Laterality: N/A;  . FOOT SURGERY Right   . LEFT HEART CATHETERIZATION WITH CORONARY ANGIOGRAM N/A 02/26/2014   Procedure: LEFT HEART CATHETERIZATION WITH CORONARY ANGIOGRAM;  Surgeon: Jettie Booze, MD;  Location: [facility];  Service: Cardiovascular;  Laterality: N/A;  . TUBAL LIGATION  1999    Outpatient Medications Prior to Visit  Medication Sig Dispense Refill  . albuterol (PROVENTIL HFA;VENTOLIN HFA) 108 (90 Base) MCG/ACT inhaler Inhale 2 puffs into the  lungs every 6 (six) hours as needed for wheezing or shortness of breath. 1 Inhaler 0  . Armodafinil (NUVIGIL) 250 MG tablet Take 250 mg by mouth daily.    . carvedilol (COREG) 25 MG tablet Take 1 tablet (25 mg total) by mouth 2 (two) times daily with a meal. 180 tablet 3  . colestipol (COLESTID) 1 g tablet Take 2 tablets (2 g total) by mouth  daily. 60 tablet 3  . cyclobenzaprine (FLEXERIL) 10 MG tablet Take 1 tablet (10 mg total) by mouth 3 (three) times daily as needed for muscle spasms. 30 tablet 0  . diazepam (VALIUM) 10 MG tablet Take 10 mg by mouth daily.  3  . Diclofenac Sodium 2 % SOLN Apply 1 pump twice daily. (Patient taking differently: Apply 1 application topically as needed (for pain). ) 112 g 3  . fluticasone (FLONASE) 50 MCG/ACT nasal spray Place 2 sprays into both nostrils daily as needed for allergies.     . furosemide (LASIX) 80 MG tablet Take 80 mg by mouth 2 (two) times daily.    Marland Kitchen gabapentin (NEURONTIN) 100 MG capsule Take 100 mg by mouth daily as needed (for pain).     . ivabradine (CORLANOR) 5 MG TABS tablet Take 1 tablet (5 mg total) by mouth 2 (two) times daily with a meal. 180 tablet 3  . ondansetron (ZOFRAN) 4 MG tablet Take 4 mg by mouth every 6 (six) hours as needed for nausea/vomiting.    . Probiotic Product (ALIGN) 4 MG CAPS Take 4 mg by mouth daily.    . sacubitril-valsartan (ENTRESTO) 97-103 MG Take 1 tablet by mouth 2 (two) times daily. 60 tablet 11  . spironolactone (ALDACTONE) 25 MG tablet Take 1 tablet (25 mg total) by mouth daily. 90 tablet 3  . venlafaxine XR (EFFEXOR-XR) 75 MG 24 hr capsule Take 225 mg by mouth daily with breakfast.    . Vitamin D, Ergocalciferol, (DRISDOL) 50000 units CAPS capsule TAKE ONE CAPSULE BY MOUTH BY MOUTH ONCE A WEEK 4 capsule 1  . colesevelam (WELCHOL) 625 MG tablet Take 1 tablet (625 mg total) by mouth 2 (two) times daily with a meal. 180 tablet 1  . dicyclomine (BENTYL) 10 MG capsule Take 2 capsules (20 mg total) by mouth 4 (four) times daily -  before meals and at bedtime. (Patient taking differently: Take 10 mg by mouth 3 (three) times daily before meals. ) 90 capsule 3  . pantoprazole (PROTONIX) 20 MG tablet Take 1 tablet (20 mg total) by mouth daily. 30-60 mins before breakfast (Patient taking differently: Take 40 mg by mouth 2 (two) times daily. ) 30 tablet 3  .  promethazine (PHENERGAN) 25 MG tablet Take 1 tablet (25 mg total) by mouth every 8 (eight) hours as needed for nausea or vomiting. 30 tablet 0  . HYDROcodone-acetaminophen (NORCO/VICODIN) 5-325 MG tablet Take 1 tablet by mouth every 4 (four) hours as needed for moderate pain. 30 tablet 0   No facility-administered medications prior to visit.     Allergies  Allergen Reactions  . Vancomycin Other (See Comments)    "did something to my kidneys"; went into kidney failure  . Other     IV Contrast Dye - made patient go into renal failure in high doses   . Aspirin Other (See Comments)    Wheezing; but patient still takes if she needs to  . Ciprofloxacin Itching and Rash  . Sulfa Antibiotics Itching and Rash    Family History  Problem  Relation Age of Onset  . Emphysema Maternal Grandmother     smoked  . Heart disease Maternal Grandmother 37    MI  . Rheum arthritis Mother   . Allergies Daughter   . Colon cancer Neg Hx     Social History  Substance Use Topics  . Smoking status: Never Smoker  . Smokeless tobacco: Never Used  . Alcohol use No    ROS: As per history of present illness, otherwise negative  BP 116/90   Pulse 88   Ht 5\' 6"  (1.676 m)   Wt 240 lb 8 oz (109.1 kg)   BMI 38.82 kg/m  Constitutional: Well-developed and well-nourished. No distress. HEENT: Normocephalic and atraumatic. Conjunctivae are normal.  No scleral icterus. Neurological: Alert and oriented to person place and time. Psychiatric: Normal mood and affect. Behavior is normal.  RELEVANT LABS AND IMAGING: CBC    Component Value Date/Time   WBC 7.3 03/01/2016 1007   RBC 4.62 03/01/2016 1007   HGB 13.1 03/01/2016 1007   HCT 41.5 03/01/2016 1007   PLT 236 03/01/2016 1007   MCV 89.8 03/01/2016 1007   MCH 28.4 03/01/2016 1007   MCHC 31.6 03/01/2016 1007   RDW 13.4 03/01/2016 1007   LYMPHSABS 2.2 10/15/2014 1151   MONOABS 0.5 10/15/2014 1151   EOSABS 0.1 10/15/2014 1151   BASOSABS 0.1 10/15/2014  1151    CMP     Component Value Date/Time   NA 139 03/26/2016 1536   K 4.0 03/26/2016 1536   CL 102 03/26/2016 1536   CO2 25 03/26/2016 1536   GLUCOSE 84 03/26/2016 1536   BUN 9 03/26/2016 1536   CREATININE 0.90 03/26/2016 1536   CALCIUM 9.5 03/26/2016 1536   PROT 7.2 03/01/2016 1007   ALBUMIN 3.9 03/01/2016 1007   AST 18 03/01/2016 1007   ALT 11 (L) 03/01/2016 1007   ALKPHOS 80 03/01/2016 1007   BILITOT 0.6 03/01/2016 1007   GFRNONAA >60 03/01/2016 1007   GFRNONAA >89 01/18/2014 1621   GFRAA >60 03/01/2016 1007   GFRAA >89 01/18/2014 1621    ASSESSMENT/PLAN: 40 year old female with a past medical history of chronic diarrhea, history of cholecystectomy, CHF, diabetes, sleep apnea who is seen for follow-up. She was seen by Ellouise Newer, [facility] 01/29/2016 to evaluate dyspepsia, abdominal cramping and loose stools.  1. Chronic diarrhea/bile acid diarrhea -- after evaluation and recent negative endoscopies, bile acid diarrhea is felt to explain her chronic crampy abdominal pain and loose stools. Welchol helped but resulted in constipation and she could never find a dose which did not result in constipation. I'm going to switch her to colestipol 2 g daily. We may need to further titrate this dose going forward. She understands this. As that she notify me if this fails to improve symptoms or should she develop constipation on this dose. Separate this medication on 2 hours from either side of all other medicines. Continue Bentyl 20 mg 3 times a day on a scheduled basis for now. If we establish a stable dose of colestipol she will likely be able to use Bentyl as needed. I am not convinced she needs PPI she is not having reflux symptoms or dyspeptic symptoms currently. Will discontinue pantoprazole and see how she does.  2-3 month follow-up, sooner if necessary  15 minutes spent with the patient today. Greater than 50% was spent in counseling and coordination of care with the  patient     [id number] [address], [place], [place]  [id number]   

## 2016-04-28 NOTE — Progress Notes (Signed)
Subjective:    Patient ID: [name], female    DOB: 1975-11-05, 40 y.o.   MRN: [id number]  Chief Complaint  Patient presents with  . Ear Pain    ear pain, sore throat, cough, can not breathe, x3 days    HPI:  [name] is a 40 y.o. female who  has a past medical history of Anxiety; Arthritis; CHF (congestive heart failure) ([place]); Depression; Diabetes mellitus without complication ([place]); Dyspnea; Fibroid; Gallstones; Hypertension; IBS (irritable bowel syndrome); OSA (obstructive sleep apnea); Ovarian cyst; Postpartum cardiomyopathy; and Termination of pregnancy. and presents today for an acute office visit.  This is a new problem. Associated symptom of ear pain, sore throat, cough and shortness of breath have been going on for about 3 days. Highest temperature was 99 with no other fevers. Course of the symptoms has worsened since iniital onset. Modifying factors include Nyquil which did not help very much. No recent antibiotics. No sick contacts.   Allergies  Allergen Reactions  . Vancomycin Other (See Comments)    "did something to my kidneys"; went into kidney failure  . Other     IV Contrast Dye - made patient go into renal failure in high doses   . Aspirin Other (See Comments)    Wheezing; but patient still takes if she needs to  . Ciprofloxacin Itching and Rash  . Sulfa Antibiotics Itching and Rash      Outpatient Medications Prior to Visit  Medication Sig Dispense Refill  . albuterol (PROVENTIL HFA;VENTOLIN HFA) 108 (90 Base) MCG/ACT inhaler Inhale 2 puffs into the lungs every 6 (six) hours as needed for wheezing or shortness of breath. 1 Inhaler 0  . Armodafinil (NUVIGIL) 250 MG tablet Take 250 mg by mouth daily.    . carvedilol (COREG) 25 MG tablet Take 1 tablet (25 mg total) by mouth 2 (two) times daily with a meal. 180 tablet 3  . colestipol (COLESTID) 1 g tablet Take 2 tablets (2 g total) by mouth daily. 60 tablet 3  . cyclobenzaprine (FLEXERIL) 10 MG tablet  Take 1 tablet (10 mg total) by mouth 3 (three) times daily as needed for muscle spasms. 30 tablet 0  . diazepam (VALIUM) 10 MG tablet Take 10 mg by mouth daily.  3  . Diclofenac Sodium 2 % SOLN Apply 1 pump twice daily. (Patient taking differently: Apply 1 application topically as needed (for pain). ) 112 g 3  . dicyclomine (BENTYL) 20 MG tablet Take 1 tablet (20 mg total) by mouth 2 (two) times daily with a meal. 60 tablet 2  . fluticasone (FLONASE) 50 MCG/ACT nasal spray Place 2 sprays into both nostrils daily as needed for allergies.     . furosemide (LASIX) 80 MG tablet Take 80 mg by mouth 2 (two) times daily.    Marland Kitchen gabapentin (NEURONTIN) 100 MG capsule Take 100 mg by mouth daily as needed (for pain).     . ivabradine (CORLANOR) 5 MG TABS tablet Take 1 tablet (5 mg total) by mouth 2 (two) times daily with a meal. 180 tablet 3  . ondansetron (ZOFRAN) 4 MG tablet Take 4 mg by mouth every 6 (six) hours as needed for nausea/vomiting.    . Probiotic Product (ALIGN) 4 MG CAPS Take 4 mg by mouth daily.    . promethazine (PHENERGAN) 25 MG tablet Take 1 tablet (25 mg total) by mouth every 8 (eight) hours as needed for nausea or vomiting. 30 tablet 0  . sacubitril-valsartan (ENTRESTO)  97-103 MG Take 1 tablet by mouth 2 (two) times daily. 60 tablet 11  . spironolactone (ALDACTONE) 25 MG tablet Take 1 tablet (25 mg total) by mouth daily. 90 tablet 3  . venlafaxine XR (EFFEXOR-XR) 75 MG 24 hr capsule Take 225 mg by mouth daily with breakfast.    . VICTOZA 18 MG/3ML SOPN daily.    . Vitamin D, Ergocalciferol, (DRISDOL) 50000 units CAPS capsule TAKE ONE CAPSULE BY MOUTH BY MOUTH ONCE A WEEK 4 capsule 1   No facility-administered medications prior to visit.      Review of Systems  Constitutional: Negative for chills and fever.  HENT: Positive for congestion, ear pain and sore throat. Negative for facial swelling.   Respiratory: Positive for cough, chest tightness, shortness of breath and wheezing.     Cardiovascular: Negative for chest pain.  Neurological: Positive for headaches.      Objective:    BP 112/84 (BP Location: Left Arm, Patient Position: Sitting, Cuff Size: Large)   Pulse 80   Temp 97.8 F (36.6 C) (Oral)   Resp 16   Ht 5\' 6"  (1.676 m)   Wt 240 lb 12.8 oz (109.2 kg)   SpO2 99%   BMI 38.87 kg/m  Nursing note and vital signs reviewed.  Physical Exam  Constitutional: She is oriented to person, place, and time. She appears well-developed and well-nourished. She does not have a sickly appearance. She does not appear ill. No distress.  HENT:  Right Ear: Hearing, tympanic membrane, external ear and ear canal normal.  Left Ear: Hearing, tympanic membrane, external ear and ear canal normal.  Nose: Nose normal. Right sinus exhibits no maxillary sinus tenderness and no frontal sinus tenderness. Left sinus exhibits no maxillary sinus tenderness and no frontal sinus tenderness.  Mouth/Throat: Uvula is midline, oropharynx is clear and moist and mucous membranes are normal.  Cardiovascular: Normal rate, regular rhythm, normal heart sounds and intact distal pulses.   Pulmonary/Chest: Effort normal. No respiratory distress. She has wheezes. She has no rales. She exhibits no tenderness.  Neurological: She is alert and oriented to person, place, and time.  Skin: Skin is warm and dry.  Psychiatric: She has a normal mood and affect. Her behavior is normal. Judgment and thought content normal.       Assessment & Plan:   Problem List Items Addressed This Visit      Respiratory   Acute upper respiratory infection - Primary    Symptoms and exam consistent with acute upper respiratory infection with exacerbation of her asthma. In office injection Depomedrol provided.  No indication for antibiotics. Continue over-the-counter medications as needed for symptom relief and supportive care. Sample of Symbicort provided.start promethazine-codeine for cough and sleep. Follow-up if symptoms  worsen or do not improve.      Relevant Medications   promethazine-codeine (PHENERGAN WITH CODEINE) 6.25-10 MG/5ML syrup   methylPREDNISolone acetate (DEPO-MEDROL) injection 80 mg (Completed)   budesonide-formoterol (SYMBICORT) 160-4.5 MCG/ACT inhaler       I am having Ms. Amenta start on promethazine-codeine and budesonide-formoterol. I am also having her maintain her gabapentin, venlafaxine XR, diazepam, Armodafinil, fluticasone, spironolactone, Diclofenac Sodium, cyclobenzaprine, albuterol, carvedilol, furosemide, ondansetron, sacubitril-valsartan, ALIGN, ivabradine, Vitamin D (Ergocalciferol), colestipol, VICTOZA, dicyclomine, and promethazine. We administered methylPREDNISolone acetate.   Meds ordered this encounter  Medications  . promethazine-codeine (PHENERGAN WITH CODEINE) 6.25-10 MG/5ML syrup    Sig: Take 5 mLs by mouth every 6 (six) hours as needed for cough.    Dispense:  180 mL  Refill:  0    Order Specific Question:   Supervising Provider    Answer:   Pricilla Holm A [id number]  . methylPREDNISolone acetate (DEPO-MEDROL) injection 80 mg  . budesonide-formoterol (SYMBICORT) 160-4.5 MCG/ACT inhaler    Sig: Inhale 2 puffs into the lungs 2 (two) times daily.    Dispense:  1 Inhaler    Refill:  3    Order Specific Question:   Supervising Provider    Answer:   Pricilla Holm A [id number]     Follow-up: Return if symptoms worsen or fail to improve.  Mauricio Po, FNP

## 2016-04-28 NOTE — Assessment & Plan Note (Addendum)
Symptoms and exam consistent with acute upper respiratory infection with exacerbation of her asthma. In office injection Depomedrol provided.  No indication for antibiotics. Continue over-the-counter medications as needed for symptom relief and supportive care. Sample of Symbicort provided.start promethazine-codeine for cough and sleep. Follow-up if symptoms worsen or do not improve.

## 2016-05-04 ENCOUNTER — Ambulatory Visit (HOSPITAL_COMMUNITY): Payer: 59 | Attending: Cardiology

## 2016-05-04 ENCOUNTER — Other Ambulatory Visit: Payer: Self-pay

## 2016-05-04 DIAGNOSIS — E119 Type 2 diabetes mellitus without complications: Secondary | ICD-10-CM | POA: Insufficient documentation

## 2016-05-04 DIAGNOSIS — I351 Nonrheumatic aortic (valve) insufficiency: Secondary | ICD-10-CM | POA: Insufficient documentation

## 2016-05-04 DIAGNOSIS — I5042 Chronic combined systolic (congestive) and diastolic (congestive) heart failure: Secondary | ICD-10-CM | POA: Diagnosis present

## 2016-05-04 DIAGNOSIS — I110 Hypertensive heart disease with heart failure: Secondary | ICD-10-CM | POA: Insufficient documentation

## 2016-05-04 DIAGNOSIS — G4733 Obstructive sleep apnea (adult) (pediatric): Secondary | ICD-10-CM | POA: Diagnosis not present

## 2016-05-07 ENCOUNTER — Telehealth (HOSPITAL_COMMUNITY): Payer: Self-pay | Admitting: Cardiology

## 2016-05-07 NOTE — Telephone Encounter (Signed)
-----   Message from Loren Racer, LPN sent at [id number]  2:15 PM EST ----- Hello,  Dr. Tamala Julian wanted to get this pt established with you guys for her CHF. She is aware that someone will be calling to get her scheduled.  Thanks so much [place], LPN

## 2016-05-07 NOTE — Telephone Encounter (Signed)
Next available New patient appointment given to patient. Voiced understanding of parking information. New patient packet mailed

## 2016-05-11 ENCOUNTER — Encounter (HOSPITAL_COMMUNITY): Payer: Self-pay | Admitting: Emergency Medicine

## 2016-05-11 ENCOUNTER — Other Ambulatory Visit: Payer: Self-pay

## 2016-05-11 ENCOUNTER — Emergency Department (HOSPITAL_COMMUNITY)
Admission: EM | Admit: 2016-05-11 | Discharge: 2016-05-11 | Disposition: A | Discharge status: Home / Self Care | Payer: 59 | Attending: Emergency Medicine | Admitting: Emergency Medicine

## 2016-05-11 ENCOUNTER — Emergency Department (HOSPITAL_COMMUNITY): Payer: 59

## 2016-05-11 DIAGNOSIS — I110 Hypertensive heart disease with heart failure: Secondary | ICD-10-CM | POA: Diagnosis not present

## 2016-05-11 DIAGNOSIS — I509 Heart failure, unspecified: Secondary | ICD-10-CM | POA: Insufficient documentation

## 2016-05-11 DIAGNOSIS — Z79899 Other long term (current) drug therapy: Secondary | ICD-10-CM | POA: Diagnosis not present

## 2016-05-11 DIAGNOSIS — R079 Chest pain, unspecified: Secondary | ICD-10-CM

## 2016-05-11 DIAGNOSIS — E119 Type 2 diabetes mellitus without complications: Secondary | ICD-10-CM | POA: Diagnosis not present

## 2016-05-11 DIAGNOSIS — R0789 Other chest pain: Secondary | ICD-10-CM | POA: Insufficient documentation

## 2016-05-11 LAB — BASIC METABOLIC PANEL
Anion gap: 7 (ref 5–15)
BUN: 10 mg/dL (ref 6–20)
CO2: 26 mmol/L (ref 22–32)
Calcium: 9.7 mg/dL (ref 8.9–10.3)
Chloride: 106 mmol/L (ref 101–111)
Creatinine, Ser: 0.78 mg/dL (ref 0.44–1.00)
GFR calc Af Amer: >60 mL/min (ref >60)
GFR calc non Af Amer: >60 mL/min (ref >60)
Glucose, Bld: 99 mg/dL (ref 65–99)
Potassium: 4.4 mmol/L (ref 3.5–5.1)
Sodium: 139 mmol/L (ref 135–145)

## 2016-05-11 LAB — CBC
HCT: 41.4 % (ref 36.0–46.0)
Hemoglobin: 13.1 g/dL (ref 12.0–15.0)
MCH: 28.6 pg (ref 26.0–34.0)
MCHC: 31.6 g/dL (ref 30.0–36.0)
MCV: 90.4 fL (ref 78.0–100.0)
Platelets: 262 10*3/uL (ref 150–400)
RBC: 4.58 MIL/uL (ref 3.87–5.11)
RDW: 14.1 % (ref 11.5–15.5)
WBC: 9.4 10*3/uL (ref 4.0–10.5)

## 2016-05-11 LAB — I-STAT TROPONIN, ED
Troponin i, poc: 0 ng/mL (ref 0.00–0.08)
Troponin i, poc: 0 ng/mL (ref 0.00–0.08)

## 2016-05-11 LAB — BRAIN NATRIURETIC PEPTIDE: B Natriuretic Peptide: 53.8 pg/mL (ref 0.0–100.0)

## 2016-05-11 MED ORDER — MORPHINE SULFATE (PF) 4 MG/ML IV SOLN
4.0000 mg | Freq: Once | INTRAVENOUS | Status: AC
  Administered 2016-05-11: 4 mg via INTRAVENOUS
  Filled 2016-05-11: qty 1

## 2016-05-11 MED ORDER — ONDANSETRON 4 MG PO TBDP: 4.0000 mg | ORAL_TABLET | Freq: Three times a day (TID) | ORAL | 0 refills | Status: DC | PRN

## 2016-05-11 MED ORDER — ONDANSETRON HCL 4 MG/2ML IJ SOLN
4.0000 mg | Freq: Once | INTRAMUSCULAR | Status: AC
  Administered 2016-05-11: 4 mg via INTRAVENOUS
  Filled 2016-05-11: qty 2

## 2016-05-11 MED ORDER — ONDANSETRON HCL 4 MG/2ML IJ SOLN
4.0000 mg | Freq: Once | INTRAMUSCULAR | Status: AC
  Administered 2016-05-11: 4 mg via INTRAVENOUS

## 2016-05-11 MED ORDER — HYDROCODONE-ACETAMINOPHEN 5-325 MG PO TABS: 1.0000 | ORAL_TABLET | Freq: Four times a day (QID) | ORAL | 0 refills | Status: DC | PRN

## 2016-05-11 NOTE — ED Triage Notes (Signed)
C/o constant squeezing pain to center of chest since 5pm yesterday with headache, sob, and non-productive cough.

## 2016-05-11 NOTE — ED Provider Notes (Signed)
TIME SEEN: 4:50 AM  CHIEF COMPLAINT: Chest pain, shortness of breath  HPI: Pt is a 40 y.o. 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 She states chest pain is a pressure and constant pain that has been present since 5 PM yesterday. Intermittently she will feel like she is having a cramp in her chest. No radiation of pain. Pain is worse with movement. She does have associated shortness of breath and dry cough. Also complaining of mild diffuse headache. No nausea, vomiting, dizziness, diaphoresis. No fever. She had a negative cardiac catheterization in June 2017. No history of PE, DVT, exogenous estrogen use, fracture, surgery, trauma, hospitalization, prolonged travel. No lower extremity swelling or pain. No calf tenderness.  She had a negative CT of her chest in June that ruled out pulmonary embolus.  Echo June 2017:  Study Conclusions  - Left ventricle: The cavity size was mildly dilated. There was   mild focal basal and mild concentric hypertrophy of the septum.   Systolic function was severely reduced. The estimated ejection   fraction was in the range of 20% to 25%. Diffuse hypokinesis.   Doppler parameters are consistent with abnormal left ventricular   relaxation (grade 1 diastolic dysfunction). There was no evidence   of elevated ventricular filling pressure by Doppler parameters. - Aortic valve: Trileaflet; normal thickness leaflets. There was   mild regurgitation. - Aortic root: The aortic root was normal in size. - Ascending aorta: The ascending aorta was normal in size. - Mitral valve: There was trivial regurgitation. - Right ventricle: Systolic function was normal. - Right atrium: The atrium was normal in size. - Tricuspid valve: There was no regurgitation. - Pulmonic valve: There was no regurgitation. - Pulmonary arteries:  Systolic pressure was within the normal   range. - Inferior vena cava: The vessel was normal in size. - Pericardium, extracardiac: There was no pericardial effusion.  ROS: See HPI Constitutional: no fever  Eyes: no drainage  ENT: no runny nose   Cardiovascular:   chest pain  Resp: SOB  GI: no vomiting GU: no dysuria Integumentary: no rash  Allergy: no hives  Musculoskeletal: no leg swelling  Neurological: no slurred speech ROS otherwise negative  PAST MEDICAL HISTORY/PAST SURGICAL HISTORY:  Past Medical History:  Diagnosis Date  . Anxiety   . Arthritis    right shoulder   . CHF (congestive heart failure) ([place])   . Depression   . Diabetes mellitus without complication ([place])   . Dyspnea    comes and goes intermittently mostly with exertion   . Fibroid    age 12  . Gallstones   . Hypertension   . IBS (irritable bowel syndrome)   . OSA (obstructive sleep apnea)    on CPAP   . Ovarian cyst    1999; surgically removed  . 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 Termination of pregnancy    due to cardiac risk    MEDICATIONS:  Prior to Admission medications   Medication Sig Start Date End Date Taking? Authorizing Provider  albuterol (PROVENTIL HFA;VENTOLIN HFA) 108 (90 Base) MCG/ACT inhaler Inhale 2 puffs into the lungs every 6 (six) hours as needed for wheezing or shortness of breath. 01/10/16   Golden Circle, FNP  Armodafinil (NUVIGIL) 250 MG tablet Take 250 mg by mouth daily.    Historical Provider, MD  budesonide-formoterol (SYMBICORT) 160-4.5 MCG/ACT inhaler Inhale 2 puffs into the lungs 2 (two) times daily. 04/28/16   Golden Circle, FNP  carvedilol (COREG) 25 MG tablet Take 1 tablet (25 mg total) by mouth 2 (two) times daily with a meal. 01/10/16   Belva Crome, MD  colestipol (COLESTID) 1 g tablet Take 2 tablets (2 g total) by mouth daily. 04/27/16   Jerene Bears, MD  cyclobenzaprine (FLEXERIL) 10 MG tablet Take 1 tablet (10 mg total) by  mouth 3 (three) times daily as needed for muscle spasms. 01/10/16   Golden Circle, FNP  diazepam (VALIUM) 10 MG tablet Take 10 mg by mouth daily. 08/17/15   Historical Provider, MD  Diclofenac Sodium 2 % SOLN Apply 1 pump twice daily. Patient taking differently: Apply 1 application topically as needed (for pain).  12/03/15   Lyndal Pulley, DO  dicyclomine (BENTYL) 20 MG tablet Take 1 tablet (20 mg total) by mouth 2 (two) times daily with a meal. 04/28/16   Jerene Bears, MD  fluticasone (FLONASE) 50 MCG/ACT nasal spray Place 2 sprays into both nostrils daily as needed for allergies.     Historical Provider, MD  furosemide (LASIX) 80 MG tablet Take 80 mg by mouth 2 (two) times daily. 01/19/16   Historical Provider, MD  gabapentin (NEURONTIN) 100 MG capsule Take 100 mg by mouth daily as needed (for pain).     Historical Provider, MD  ivabradine (CORLANOR) 5 MG TABS tablet Take 1 tablet (5 mg total) by mouth 2 (two) times daily with a meal. 04/13/16   Belva Crome, MD  ondansetron (ZOFRAN) 4 MG tablet Take 4 mg by mouth every 6 (six) hours as needed for nausea/vomiting. 01/14/16   Historical Provider, MD  Probiotic Product (ALIGN) 4 MG CAPS Take 4 mg by mouth daily.    Historical Provider, MD  promethazine (PHENERGAN) 25 MG tablet Take 1 tablet (25 mg total) by mouth every 8 (eight) hours as needed for nausea or vomiting. 04/28/16   Golden Circle, FNP  promethazine-codeine (PHENERGAN WITH CODEINE) 6.25-10 MG/5ML syrup Take 5 mLs by mouth every 6 (six) hours as needed for cough. 04/28/16   Golden Circle, FNP  sacubitril-valsartan (ENTRESTO) 97-103 MG Take 1 tablet by mouth 2 (two) times daily. 02/19/16   Belva Crome, MD  spironolactone (ALDACTONE) 25 MG tablet Take 1 tablet (25 mg total) by mouth daily. 11/20/15   Brittainy Erie Noe, PA-C  venlafaxine XR (EFFEXOR-XR) 75 MG 24 hr capsule Take 225 mg by mouth daily with breakfast.    Historical Provider, MD  VICTOZA 18 MG/3ML SOPN daily. 02/21/16    Historical Provider, MD  Vitamin D, Ergocalciferol, (DRISDOL) 50000 units CAPS capsule TAKE ONE CAPSULE BY MOUTH BY MOUTH ONCE A WEEK 04/27/16   Lyndal Pulley, DO    ALLERGIES:  Allergies  Allergen Reactions  . Vancomycin Other (See Comments)    "did something to my kidneys"; went into kidney failure  . Other     IV Contrast Dye - made patient go into renal failure in high doses   . Aspirin Other (See Comments)    Wheezing; but patient still takes if she needs to  . Ciprofloxacin Itching and Rash  . Sulfa Antibiotics Itching and Rash    SOCIAL HISTORY:  Social History  Substance Use Topics  . Smoking status: Never Smoker  . Smokeless tobacco: Never Used  . Alcohol use No    FAMILY HISTORY: Family History  Problem Relation Age of Onset  . Emphysema Maternal Grandmother     smoked  . Heart disease Maternal Grandmother 62    MI  . Rheum arthritis Mother   . Allergies Daughter   . Colon cancer Neg Hx     EXAM: BP 124/79 (BP Location: Left Arm)   Pulse 82   Temp 98.5 F (36.9 C) (Oral)   Resp 18   LMP 04/15/2016   SpO2 98%  CONSTITUTIONAL: Alert and oriented and responds appropriately to questions. Well-appearing; well-nourished HEAD: Normocephalic EYES: Conjunctivae clear, PERRL, EOMI ENT: normal nose; no rhinorrhea; moist mucous membranes NECK: Supple, no meningismus, no nuchal rigidity, no LAD, no JVD  CARD: RRR; S1 and S2 appreciated; no murmurs, no clicks, no rubs, no gallops CHEST:  Nontender to palpation without crepitus, ecchymosis, deformity. No rash or other lesions. RESP: Normal chest excursion without splinting or tachypnea; breath sounds clear and equal bilaterally; no wheezes, no rhonchi, no rales, no hypoxia or respiratory distress, speaking full sentences ABD/GI: Normal bowel sounds; non-distended; soft, non-tender, no rebound, no guarding, no peritoneal signs, no hepatosplenomegaly BACK:  The back appears normal and is non-tender to palpation, there  is no CVA tenderness EXT: Normal ROM in all joints; non-tender to palpation; no edema; normal capillary refill; no cyanosis, no calf tenderness or swelling    SKIN: Normal color for age and race; warm; no rash NEURO: Moves all extremities equally, sensation to light touch intact diffusely, cranial nerves II through XII intact, normal speech PSYCH: The patient's mood and manner are appropriate. Grooming and personal hygiene are appropriate.  MEDICAL DECISION MAKING: Patient here with atypical chest pain.  Patient had a negative cardiac catheterization in June. Doubt that she developed any significant coronary artery disease and she has had 2 negative troponins. No sign of volume overload on exam and her chest x-ray shows no edema. She has follow-up scheduled with the heart failure clinic. Morphine has improved her pain. She has been hemodynamically stable. Nothing to suggest pericarditis, myocarditis. No risk factors for pulmonary embolus and she is not tachycardic, hypoxic or hypotensive. No pleuritic pain. No exertional pain. I feel she is safe to be discharged home with close outpatient follow-up. Discussed return precautions. She verbalized understanding and is comfortable with this plan.   At this time, I do not feel there is any life-threatening condition present. I have reviewed and discussed all results (EKG, imaging, lab, urine as appropriate) and exam findings with patient/family. I have reviewed nursing notes and appropriate previous records.  I feel the patient is safe to be discharged home without further emergent workup and can continue workup as an outpatient as needed. Discussed usual and customary return precautions. Patient/family verbalize understanding and are comfortable with this plan.  Outpatient follow-up has been provided. All questions have been answered.    EKG Interpretation  Date/Time:  Monday May 11 2016 03:44:14 EST Ventricular Rate:  86 PR Interval:  188 QRS  Duration: 84 QT Interval:  388 QTC Calculation: 464 R Axis:   -9 Text Interpretation:  Normal sinus rhythm Possible Left atrial enlargement Anterior infarct , age undetermined Abnormal ECG No significant change since last tracing Confirmed by WARD,  DO, KRISTEN [phone]) on 05/11/2016 7:25:20 AM              [place], DO 05/11/16 [id number]

## 2016-05-16 ENCOUNTER — Encounter: Payer: Self-pay | Admitting: Family

## 2016-05-18 MED ORDER — VICTOZA 18 MG/3ML ~~LOC~~ SOPN: 1.8000 mg | PEN_INJECTOR | Freq: Every day | SUBCUTANEOUS | 0 refills | Status: DC

## 2016-05-26 ENCOUNTER — Other Ambulatory Visit: Payer: Self-pay

## 2016-05-26 NOTE — Telephone Encounter (Signed)
Left message for patient to call me back.  Got refill request for 90 day supply of pantoprazole and on her 04/28/16 Dr Hilarie Fredrickson visit he wanted her to d/c and see how she did without it.  Need to get current symptoms.

## 2016-05-28 ENCOUNTER — Encounter: Payer: Self-pay | Admitting: Internal Medicine

## 2016-05-28 ENCOUNTER — Ambulatory Visit (INDEPENDENT_AMBULATORY_CARE_PROVIDER_SITE_OTHER): Payer: BLUE CROSS/BLUE SHIELD | Admitting: Internal Medicine

## 2016-05-28 ENCOUNTER — Other Ambulatory Visit (INDEPENDENT_AMBULATORY_CARE_PROVIDER_SITE_OTHER): Payer: BLUE CROSS/BLUE SHIELD

## 2016-05-28 ENCOUNTER — Telehealth: Payer: Self-pay | Admitting: Interventional Cardiology

## 2016-05-28 VITALS — BP 130/80 | HR 90 | Resp 20 | Wt 240.0 lb

## 2016-05-28 DIAGNOSIS — R739 Hyperglycemia, unspecified: Secondary | ICD-10-CM

## 2016-05-28 DIAGNOSIS — R350 Frequency of micturition: Secondary | ICD-10-CM | POA: Insufficient documentation

## 2016-05-28 DIAGNOSIS — R531 Weakness: Secondary | ICD-10-CM

## 2016-05-28 DIAGNOSIS — I10 Essential (primary) hypertension: Secondary | ICD-10-CM | POA: Diagnosis not present

## 2016-05-28 DIAGNOSIS — E876 Hypokalemia: Secondary | ICD-10-CM

## 2016-05-28 LAB — HEMOGLOBIN A1C: Hgb A1c MFr Bld: 5.3 % (ref 4.6–6.5)

## 2016-05-28 LAB — TSH: TSH: 1.09 u[IU]/mL (ref 0.35–4.50)

## 2016-05-28 LAB — BASIC METABOLIC PANEL
BUN: 7 mg/dL (ref 6–23)
CO2: 29 mEq/L (ref 19–32)
Calcium: 9.3 mg/dL (ref 8.4–10.5)
Chloride: 104 mEq/L (ref 96–112)
Creatinine, Ser: 0.81 mg/dL (ref 0.40–1.20)
GFR: 99.65 mL/min (ref >60.00)
Glucose, Bld: 93 mg/dL (ref 70–99)
Potassium: 3.4 mEq/L — ABNORMAL LOW (ref 3.5–5.1)
Sodium: 139 mEq/L (ref 135–145)

## 2016-05-28 LAB — URINALYSIS, ROUTINE W REFLEX MICROSCOPIC
Ketones, ur: NEGATIVE
Leukocytes, UA: NEGATIVE
Nitrite: NEGATIVE
Specific Gravity, Urine: >=1.03 — AB (ref 1.000–1.030)
Urine Glucose: NEGATIVE
Urobilinogen, UA: 0.2 (ref 0.0–1.0)
pH: 6 (ref 5.0–8.0)

## 2016-05-28 LAB — HEPATIC FUNCTION PANEL
ALT: 8 U/L (ref 0–35)
AST: 10 U/L (ref 0–37)
Albumin: 4.3 g/dL (ref 3.5–5.2)
Alkaline Phosphatase: 73 U/L (ref 39–117)
Bilirubin, Direct: 0.1 mg/dL (ref 0.0–0.3)
Total Bilirubin: 0.7 mg/dL (ref 0.2–1.2)
Total Protein: 7.3 g/dL (ref 6.0–8.3)

## 2016-05-28 LAB — CBC WITH DIFFERENTIAL/PLATELET
Basophils Absolute: 0 10*3/uL (ref 0.0–0.1)
Basophils Relative: 0.3 % (ref 0.0–3.0)
Eosinophils Absolute: 0.1 10*3/uL (ref 0.0–0.7)
Eosinophils Relative: 1.1 % (ref 0.0–5.0)
HCT: 38.2 % (ref 36.0–46.0)
Hemoglobin: 12.7 g/dL (ref 12.0–15.0)
Lymphocytes Relative: 27.5 % (ref 12.0–46.0)
Lymphs Abs: 1.8 10*3/uL (ref 0.7–4.0)
MCHC: 33.2 g/dL (ref 30.0–36.0)
MCV: 87.6 fl (ref 78.0–100.0)
Monocytes Absolute: 0.4 10*3/uL (ref 0.1–1.0)
Monocytes Relative: 6.7 % (ref 3.0–12.0)
Neutro Abs: 4.2 10*3/uL (ref 1.4–7.7)
Neutrophils Relative %: 64.4 % (ref 43.0–77.0)
Platelets: 230 10*3/uL (ref 150.0–400.0)
RBC: 4.36 Mil/uL (ref 3.87–5.11)
RDW: 14.6 % (ref 11.5–15.5)
WBC: 6.5 10*3/uL (ref 4.0–10.5)

## 2016-05-28 NOTE — Progress Notes (Signed)
Subjective:    Patient ID: [name], female    DOB: 01-08-1976, 41 y.o.   MRN: [id number]  HPI Here with vague symptoms of several days general weakness, HA, fatigue and feels thirsty without hx of DM; does have hx of cardiomyopathy on lasix 80 bid with good compiance, also with mention of urinary frequency but Denies urinary symptoms such as dysuria, urgency, flank pain, hematuria or n/v, fever, chills.  Last labs done dec 18 without significant abnormality and shared with pt today.Has had very mild hyperglycemia noted on labs in past. Has listed Dx of DM on EMR, pt not currently on meds except Victoza - ? For wt loss..  Pt denies chest pain, increased sob or doe, wheezing, orthopnea, PND, increased LE swelling, palpitations, dizziness or syncope. Pt denies new neurological symptoms such as new headache, or facial or extremity weakness or numbness   Pt denies polydipsia.   Pt denies fever, wt loss, night sweats, loss of appetite, or other constitutional symptoms  Denies worsening depressive symptoms, suicidal ideation, or panic; No other new history Past Medical History:  Diagnosis Date  . Anxiety   . Arthritis    right shoulder   . CHF (congestive heart failure) ([place])   . Depression   . Diabetes mellitus without complication ([place])   . Dyspnea    comes and goes intermittently mostly with exertion   . Fibroid    age 64  . Gallstones   . Hypertension   . IBS (irritable bowel syndrome)   . OSA (obstructive sleep apnea)    on CPAP   . Ovarian cyst    1999; surgically removed  . 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 Termination of pregnancy    due to cardiac risk   Past Surgical History:  Procedure Laterality Date  . ABDOMINAL SURGERY     several  . CARDIAC CATHETERIZATION N/A 11/11/2015   Procedure: Right/Left Heart Cath and Coronary Angiography;  Surgeon: Troy Sine, MD;  Location: [facility];  Service: Cardiovascular;  Laterality: N/A;  .  CESAREAN SECTION    . CHOLECYSTECTOMY    . COLONOSCOPY WITH PROPOFOL N/A 04/21/2016   Procedure: COLONOSCOPY WITH PROPOFOL;  Surgeon: Jerene Bears, MD;  Location: [facility];  Service: Gastroenterology;  Laterality: N/A;  . ESOPHAGOGASTRODUODENOSCOPY (EGD) WITH PROPOFOL N/A 04/21/2016   Procedure: ESOPHAGOGASTRODUODENOSCOPY (EGD) WITH PROPOFOL;  Surgeon: Jerene Bears, MD;  Location: [facility];  Service: Gastroenterology;  Laterality: N/A;  . FOOT SURGERY Right   . LEFT HEART CATHETERIZATION WITH CORONARY ANGIOGRAM N/A 02/26/2014   Procedure: LEFT HEART CATHETERIZATION WITH CORONARY ANGIOGRAM;  Surgeon: Jettie Booze, MD;  [facility];  Service: Cardiovascular;  Laterality: N/A;  . TUBAL LIGATION  1999    reports that she has never smoked. She has never used smokeless tobacco. She reports that she does not drink alcohol or use drugs. family history includes Allergies in her daughter; Emphysema in her maternal grandmother; Heart disease (age of onset: 58) in her maternal grandmother; Rheum arthritis in her mother. Allergies  Allergen Reactions  . Vancomycin Other (See Comments)    "did something to my kidneys"; went into kidney failure  . Other Other (See Comments)    IV Contrast Dye - made patient go into renal failure in high doses   . Aspirin Other (See Comments)    Wheezing; but patient still takes if she needs to  . Ciprofloxacin Itching and Rash  .  Sulfa Antibiotics Itching and Rash   Current Outpatient Prescriptions on File Prior to Visit  Medication Sig Dispense Refill  . albuterol (PROVENTIL HFA;VENTOLIN HFA) 108 (90 Base) MCG/ACT inhaler Inhale 2 puffs into the lungs every 6 (six) hours as needed for wheezing or shortness of breath. 1 Inhaler 0  . Armodafinil (NUVIGIL) 250 MG tablet Take 250 mg by mouth daily.    . budesonide-formoterol (SYMBICORT) 160-4.5 MCG/ACT inhaler Inhale 2 puffs into the lungs 2 (two) times daily. 1 Inhaler 3  . carvedilol (COREG) 25  MG tablet Take 1 tablet (25 mg total) by mouth 2 (two) times daily with a meal. 180 tablet 3  . colestipol (COLESTID) 1 g tablet Take 2 tablets (2 g total) by mouth daily. 60 tablet 3  . cyclobenzaprine (FLEXERIL) 10 MG tablet Take 1 tablet (10 mg total) by mouth 3 (three) times daily as needed for muscle spasms. 30 tablet 0  . diazepam (VALIUM) 10 MG tablet Take 10 mg by mouth daily.  3  . Diclofenac Sodium 2 % SOLN Apply 1 pump twice daily. (Patient taking differently: Apply 1 application topically as needed (for pain). ) 112 g 3  . dicyclomine (BENTYL) 20 MG tablet Take 1 tablet (20 mg total) by mouth 2 (two) times daily with a meal. 60 tablet 2  . fluticasone (FLONASE) 50 MCG/ACT nasal spray Place 2 sprays into both nostrils daily as needed for allergies.     . furosemide (LASIX) 80 MG tablet Take 80 mg by mouth 2 (two) times daily.    Marland Kitchen gabapentin (NEURONTIN) 100 MG capsule Take 100 mg by mouth daily as needed (for pain).     Marland Kitchen HYDROcodone-acetaminophen (NORCO/VICODIN) 5-325 MG tablet Take 1-2 tablets by mouth every 6 (six) hours as needed. 15 tablet 0  . ivabradine (CORLANOR) 5 MG TABS tablet Take 1 tablet (5 mg total) by mouth 2 (two) times daily with a meal. 180 tablet 3  . ondansetron (ZOFRAN ODT) 4 MG disintegrating tablet Take 1 tablet (4 mg total) by mouth every 8 (eight) hours as needed for nausea or vomiting. 20 tablet 0  . ondansetron (ZOFRAN) 4 MG tablet Take 4 mg by mouth every 6 (six) hours as needed for nausea/vomiting.    . Probiotic Product (ALIGN) 4 MG CAPS Take 4 mg by mouth daily.    . promethazine (PHENERGAN) 25 MG tablet Take 1 tablet (25 mg total) by mouth every 8 (eight) hours as needed for nausea or vomiting. 30 tablet 0  . promethazine-codeine (PHENERGAN WITH CODEINE) 6.25-10 MG/5ML syrup Take 5 mLs by mouth every 6 (six) hours as needed for cough. 180 mL 0  . sacubitril-valsartan (ENTRESTO) 97-103 MG Take 1 tablet by mouth 2 (two) times daily. 60 tablet 11  .  spironolactone (ALDACTONE) 25 MG tablet Take 1 tablet (25 mg total) by mouth daily. 90 tablet 3  . UNABLE TO FIND CPAP    . venlafaxine XR (EFFEXOR-XR) 75 MG 24 hr capsule Take 225 mg by mouth daily with breakfast.    . VICTOZA 18 MG/3ML SOPN Inject 0.3 mLs (1.8 mg total) into the skin daily. 27 mL 0  . Vitamin D, Ergocalciferol, (DRISDOL) 50000 units CAPS capsule TAKE ONE CAPSULE BY MOUTH BY MOUTH ONCE A WEEK 4 capsule 1   No current facility-administered medications on file prior to visit.    Review of Systems  Constitutional: Negative for unusual diaphoresis or night sweats HENT: Negative for ear swelling or discharge Eyes: Negative for worsening visual  haziness  Respiratory: Negative for choking and stridor.   Gastrointestinal: Negative for distension or worsening eructation Genitourinary: Negative for retention or change in urine volume.  Musculoskeletal: Negative for other MSK pain or swelling Skin: Negative for color change and worsening wound Neurological: Negative for tremors and numbness other than noted  Psychiatric/Behavioral: Negative for decreased concentration or agitation other than above   All other system neg per pt    Objective:   Physical Exam BP 130/80   Pulse 90   Resp 20   Wt 240 lb (108.9 kg)   LMP 04/15/2016   SpO2 94%   BMI 38.74 kg/m  VS noted,  Constitutional: Pt appears in no apparent distress HENT: Head: NCAT.  Right Ear: External ear normal.  Left Ear: External ear normal.  Eyes: . Pupils are equal, round, and reactive to light. Conjunctivae and EOM are normal Neck: Normal range of motion. Neck supple.  Cardiovascular: Normal rate and regular rhythm.   Pulmonary/Chest: Effort normal and breath sounds without rales or wheezing.  Abd:  Soft, NT, ND, + BS Neurological: Pt is alert. Not confused , motor grossly intact Skin: Skin is warm. No rash, no LE edema Psychiatric: Pt behavior is normal. No agitation.  No other new exam findings      Assessment & Plan:

## 2016-05-28 NOTE — Assessment & Plan Note (Addendum)
Appears fatigued, but without other significant exam abnormal, for labs as ordered today, f/u PCP if not improved  Note:  Total time for pt hx, exam, review of record with pt in the room, determination of diagnoses and plan for further eval and tx is > 40 min, with over 50% spent in coordination and counseling of patient

## 2016-05-28 NOTE — Assessment & Plan Note (Signed)
stable overall by history and exam, recent data reviewed with pt, and pt to continue medical treatment as before,  to f/u any worsening symptoms or concerns BP Readings from Last 3 Encounters:  05/28/16 130/80  05/11/16 104/62  04/28/16 112/84

## 2016-05-28 NOTE — Patient Instructions (Signed)
Please continue all other medications as before, and refills have been done if requested.  Please have the pharmacy call with any other refills you may need.  Please keep your appointments with your specialists as you may have planned  Please go to the LAB in the Basement (turn left off the elevator) for the tests to be done today  You will be contacted by phone if any changes need to be made immediately.  Otherwise, you will receive a letter about your results with an explanation, but please check with MyChart first.  Please remember to sign up for MyChart if you have not done so, as this will be important to you in the future with finding out test results, communicating by private email, and scheduling acute appointments online when needed.  

## 2016-05-28 NOTE — Telephone Encounter (Signed)
Spoke with Dashonda and she is not taking the pantoprazole this was just an automatic refill request.

## 2016-05-28 NOTE — Progress Notes (Signed)
Pre visit review using our clinic review tool, if applicable. No additional management support is needed unless otherwise documented below in the visit note. 

## 2016-05-28 NOTE — Telephone Encounter (Signed)
Pt has been having fatigue, weakness, feelings of thirst and increased urination x 6 days. Seen by PCP today and blood work and UA were done.  Results not in at this time.  Vitals at appt 130/80, HR 90, O2 94%.  Pt states she has had headaches recently but denies CP, SOB, lightheadedness, dizziness, syncope or pre syncope.  Pt taking all meds as currently listed.  Advised I would send message to Dr. Tamala Julian for review but it may be after blood work from PCP has resulted as that would provide a lot of needed information based on symptoms pt is having.  Pt appreciative for assistance.

## 2016-05-28 NOTE — Telephone Encounter (Signed)
°  New Prob   Pt states she has been experiencing fatigue, generalized weakness, and increased thirst x 6 days. Pt states she was evaluated by internal medicine today. Blood work and urinalysis performed without any abnormal findings. Pt states she still wanted to touch base with cardiology. Requesting a call back from a nurse.

## 2016-05-28 NOTE — Assessment & Plan Note (Signed)
?   Significance, also for UA, r/o infection,  to f/u any worsening symptoms or concerns

## 2016-05-28 NOTE — Assessment & Plan Note (Signed)
Mild, for a1c today, consider further OHA for elevated BS

## 2016-05-28 NOTE — Telephone Encounter (Signed)
Potassium is mildly decreased. Everything else okay. Start K-Dur 10 meq daily. Bmet 110 days.

## 2016-05-29 MED ORDER — POTASSIUM CHLORIDE ER 10 MEQ PO TBCR: 10.0000 meq | EXTENDED_RELEASE_TABLET | Freq: Every day | ORAL | 3 refills | Status: DC

## 2016-05-29 NOTE — Telephone Encounter (Signed)
Spoke with pt and made her aware of Dr. Thompson Caul recommendations.  Sent prescription to preferred pharmacy.  BMET scheduled for 06/08/16.  Made pt aware to call if symptoms worsen.  Pt verbalized understanding and was appreciative for assistance.

## 2016-06-05 ENCOUNTER — Ambulatory Visit (HOSPITAL_COMMUNITY)
Admission: RE | Admit: 2016-06-05 | Discharge: 2016-06-05 | Disposition: A | Discharge status: Home / Self Care | Payer: BLUE CROSS/BLUE SHIELD | Source: Ambulatory Visit | Attending: Internal Medicine | Admitting: Internal Medicine

## 2016-06-05 VITALS — BP 126/82 | HR 84 | Wt 237.8 lb

## 2016-06-05 DIAGNOSIS — I5022 Chronic systolic (congestive) heart failure: Secondary | ICD-10-CM | POA: Diagnosis present

## 2016-06-05 DIAGNOSIS — F329 Major depressive disorder, single episode, unspecified: Secondary | ICD-10-CM | POA: Diagnosis not present

## 2016-06-05 DIAGNOSIS — I5042 Chronic combined systolic (congestive) and diastolic (congestive) heart failure: Secondary | ICD-10-CM

## 2016-06-05 DIAGNOSIS — Z79899 Other long term (current) drug therapy: Secondary | ICD-10-CM | POA: Insufficient documentation

## 2016-06-05 DIAGNOSIS — E119 Type 2 diabetes mellitus without complications: Secondary | ICD-10-CM | POA: Diagnosis not present

## 2016-06-05 DIAGNOSIS — G4733 Obstructive sleep apnea (adult) (pediatric): Secondary | ICD-10-CM | POA: Insufficient documentation

## 2016-06-05 DIAGNOSIS — I110 Hypertensive heart disease with heart failure: Secondary | ICD-10-CM | POA: Diagnosis not present

## 2016-06-05 DIAGNOSIS — F419 Anxiety disorder, unspecified: Secondary | ICD-10-CM | POA: Diagnosis not present

## 2016-06-05 DIAGNOSIS — Z6838 Body mass index (BMI) 38.0-38.9, adult: Secondary | ICD-10-CM | POA: Diagnosis not present

## 2016-06-05 NOTE — Patient Instructions (Addendum)
Referral for electrophysiology for ICD placement  Cardiopulmonary test has been ordered for you.  Follow up in 1 month

## 2016-06-05 NOTE — Progress Notes (Signed)
Advanced HF Clinic Note  Date:  06/05/2016   ID:  [name], DOB Apr 11, 1976, MRN [id number]  PCP:  Mauricio Po, FNP  Cardiologist: Pernell Dupre, MD   History of Present Illness:  [name] is a 41 y.o. 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   Cath 6/17 revealed normal vessels, moderate elevation in pulmonary artery pressures, and LVEF 25-30%.  [place] 6/17 RA 5 RV 35/6 PA 41/12 PW 11 Fick 6.4/3.0 PA sat 62%  Echo 6/15 35-40% Echo 6/17 25-30% Echo 12/17 20-25%  Was in ER 2 weeks ago for CP and SOB. CXR ok. Potassium was low and was supplemented.   She has been referred by Dr. Tamala Julian for further HF evaluation. She is on full dose GDMT including carvedilol 25 bid, entrest 97/10 bid, sprio 25, ivabradine 5 bid. Takes lasix 80 bid. Says she is very tired and SOB with minimal activity. Stays in her room most of the day. Denies edema. Sleeps on 4 pillows. Wears CPAP every night. Says fatigue continue to get worse. Can go to the store but gets extremely fatigued. Says it has been this way for a year but much over the past year.    Past Medical History:  Diagnosis Date  . Anxiety   . Arthritis    right shoulder   . CHF (congestive heart failure) ([place])   . Depression   . Diabetes mellitus without complication ([place])   . Dyspnea    comes and goes intermittently mostly with exertion   . Fibroid    age 34  . Gallstones   . Hypertension   . IBS (irritable bowel syndrome)   . OSA (obstructive sleep apnea)    on CPAP   . Ovarian cyst    1999; surgically removed  . 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 Termination of pregnancy    due to cardiac risk    Past Surgical History:  Procedure Laterality Date  . ABDOMINAL SURGERY     several  . CARDIAC  CATHETERIZATION N/A 11/11/2015   Procedure: Right/Left Heart Cath and Coronary Angiography;  Surgeon: Troy Sine, MD;  Location: [place] [facility];  Service: Cardiovascular;  Laterality: N/A;  . CESAREAN SECTION    . CHOLECYSTECTOMY    . COLONOSCOPY WITH PROPOFOL N/A 04/21/2016   Procedure: COLONOSCOPY WITH PROPOFOL;  Surgeon: Jerene Bears, MD;  Location: [facility];  Service: Gastroenterology;  Laterality: N/A;  . ESOPHAGOGASTRODUODENOSCOPY (EGD) WITH PROPOFOL N/A 04/21/2016   Procedure: ESOPHAGOGASTRODUODENOSCOPY (EGD) WITH PROPOFOL;  Surgeon: Jerene Bears, MD;  Location: [facility];  Service: Gastroenterology;  Laterality: N/A;  . FOOT SURGERY Right   . LEFT HEART CATHETERIZATION WITH CORONARY ANGIOGRAM N/A 02/26/2014   Procedure: LEFT HEART CATHETERIZATION WITH CORONARY ANGIOGRAM;  Surgeon: Jettie Booze, MD;  Location: [facility];  Service: Cardiovascular;  Laterality: N/A;  . TUBAL LIGATION  1999    Current Medications:  Current Meds  Medication Sig  . albuterol (PROVENTIL HFA;VENTOLIN HFA) 108 (90 Base) MCG/ACT inhaler Inhale 2 puffs into the lungs every 6 (six) hours as needed for wheezing or shortness of breath.  . Armodafinil (NUVIGIL) 250 MG tablet Take 250 mg by mouth daily.  . budesonide-formoterol (SYMBICORT) 160-4.5 MCG/ACT inhaler Inhale 2 puffs into the lungs 2 (two) times  daily.  . carvedilol (COREG) 25 MG tablet Take 1 tablet (25 mg total) by mouth 2 (two) times daily with a meal.  . colestipol (COLESTID) 1 g tablet Take 2 tablets (2 g total) by mouth daily.  . cyclobenzaprine (FLEXERIL) 10 MG tablet Take 1 tablet (10 mg total) by mouth 3 (three) times daily as needed for muscle spasms.  . diazepam (VALIUM) 10 MG tablet Take 10 mg by mouth daily.  . Diclofenac Sodium 2 % SOLN Apply 1 pump twice daily. (Patient taking differently: Apply 1 application topically as needed (for pain). )  . dicyclomine (BENTYL) 20 MG tablet Take 1 tablet (20 mg total) by mouth 2  (two) times daily with a meal.  . fluticasone (FLONASE) 50 MCG/ACT nasal spray Place 2 sprays into both nostrils daily as needed for allergies.   . furosemide (LASIX) 80 MG tablet Take 80 mg by mouth 2 (two) times daily.  Marland Kitchen gabapentin (NEURONTIN) 100 MG capsule Take 100 mg by mouth daily as needed (for pain).   Marland Kitchen HYDROcodone-acetaminophen (NORCO/VICODIN) 5-325 MG tablet Take 1-2 tablets by mouth every 6 (six) hours as needed.  . ivabradine (CORLANOR) 5 MG TABS tablet Take 1 tablet (5 mg total) by mouth 2 (two) times daily with a meal.  . ondansetron (ZOFRAN ODT) 4 MG disintegrating tablet Take 1 tablet (4 mg total) by mouth every 8 (eight) hours as needed for nausea or vomiting.  . potassium chloride (K-DUR) 10 MEQ tablet Take 1 tablet (10 mEq total) by mouth daily.  . Probiotic Product (ALIGN) 4 MG CAPS Take 4 mg by mouth daily.  . promethazine (PHENERGAN) 25 MG tablet Take 1 tablet (25 mg total) by mouth every 8 (eight) hours as needed for nausea or vomiting.  . promethazine-codeine (PHENERGAN WITH CODEINE) 6.25-10 MG/5ML syrup Take 5 mLs by mouth every 6 (six) hours as needed for cough.  . sacubitril-valsartan (ENTRESTO) 97-103 MG Take 1 tablet by mouth 2 (two) times daily.  Marland Kitchen spironolactone (ALDACTONE) 25 MG tablet Take 1 tablet (25 mg total) by mouth daily.  Marland Kitchen UNABLE TO FIND CPAP  . venlafaxine XR (EFFEXOR-XR) 75 MG 24 hr capsule Take 225 mg by mouth daily with breakfast.  . VICTOZA 18 MG/3ML SOPN Inject 0.3 mLs (1.8 mg total) into the skin daily.  . vitamin B-12 (CYANOCOBALAMIN) 500 MCG tablet Take 500 mcg by mouth daily.  . Vitamin D, Ergocalciferol, (DRISDOL) 50000 units CAPS capsule TAKE ONE CAPSULE BY MOUTH BY MOUTH ONCE A WEEK    Allergies:   Vancomycin; Other; Aspirin; Ciprofloxacin; and Sulfa antibiotics   Social History   Social History  . Marital status: Married    Spouse name: N/A  . Number of children: 1  . Years of education: N/A   Occupational History  . stay at home  mom    Social History Main Topics  . Smoking status: Never Smoker  . Smokeless tobacco: Never Used  . Alcohol use No  . Drug use: No  . Sexual activity: Not Currently   Other Topics Concern  . Not on file   Social History Narrative  . No narrative on file     Family History:  The patient's family history includes Allergies in her daughter; Emphysema in her maternal grandmother; Heart disease (age of onset: 57) in her maternal grandmother; Rheum arthritis in her mother.   ROS:   Please see the history of present illness.    Excessive fatigue, nausea, vomiting, headache, abdominal pain, and change in  appetite.  All other systems reviewed and are negative.   PHYSICAL EXAM:   VS:  BP 126/82   Pulse 84   Wt 237 lb 12 oz (107.8 kg)   LMP 04/15/2016   SpO2 99%   BMI 38.37 kg/m    GEN: Well nourished, well developed, in no acute distress . Morbid obesity. HEENT: normal  Neck: no JVD, carotid bruits, or masses Cardiac: RRR; no murmurs, rubs, or gallops,no edema  Respiratory:  clear to auscultation bilaterally, normal work of breathing GI: pbese. soft, nontender, nondistended, + BS MS: no deformity or atrophy  Skin: warm and dry, no rash Neuro:  Alert and Oriented x 3, Strength and sensation are intact Psych: relatively flat affect. t  Wt Readings from Last 3 Encounters:  06/05/16 237 lb 12 oz (107.8 kg)  05/28/16 240 lb (108.9 kg)  04/28/16 240 lb 12.8 oz (109.2 kg)      Studies/Labs Reviewed:   EKG:  EKG 05/11/16  NSR  qrs 25ms  Recent Labs: 11/10/2015: Magnesium 2.1 05/11/2016: B Natriuretic Peptide 53.8 05/28/2016: ALT 8; BUN 7; Creatinine, Ser 0.81; Hemoglobin 12.7; Platelets 230.0; Potassium 3.4; Sodium 139; TSH 1.09   Lipid Panel    Component Value Date/Time   CHOL 160 11/09/2015 0526   TRIG 70 11/09/2015 0526   HDL 58 11/09/2015 0526   CHOLHDL 2.8 11/09/2015 0526   VLDL 14 11/09/2015 0526   LDLCALC 88 11/09/2015 0526    Additional studies/ records that  were reviewed today include:  No recent laboratory data.    ASSESSMENT:    1. Chronic systolic HF due to peri-partum CM, onset 1999 --EF 20-25% --NYHA III-IIIB symptoms despite excellent medical regimen --Volume status looks good 2. Morbid obesity 3. OSA -reports compliance with CPAP 4. Anxiety/depression 5. DM2  PLAN:   Echo and cath images reviewed personally. She currently reports NYHA III-IIIB symptoms despite euvolemia, excellent medical therapy and recently well compensated RHC. We had a long discussion about role of advanced therapies and how we assess patients to see if they qualify. We will proceed with CPX testing to further evaluate. We also discussed the role of ICD placement. Will refer to EP to further evaluate. I am concerned that ongoing depression may be playing a role in exacerbating her symptoms.   Can consider adding Bidil and/or digoxin in future as symptoms dictate and BP tolerates.   Total time spent 45 minutes. Over half that time spent discussing above.   Glori Bickers, MD  06/05/2016 11:33 AM

## 2016-06-06 NOTE — Progress Notes (Signed)
Thanks for seeing. Agree with concern about anxiety/depression decreasing functionality.

## 2016-06-08 ENCOUNTER — Telehealth: Payer: Self-pay | Admitting: Interventional Cardiology

## 2016-06-08 ENCOUNTER — Other Ambulatory Visit: Payer: BLUE CROSS/BLUE SHIELD

## 2016-06-08 NOTE — Telephone Encounter (Signed)
[name] is calling because she recently saw Dr.Bensimmon and he stated that she needed an Urgent ICD implant. [name] stated that she could never reach the scheduler for the ICD implant and needs assistance, ASAP. Please call, thanks.

## 2016-06-08 NOTE — Telephone Encounter (Signed)
[name] has information about scheduling appointment and will call [name] to schedule.

## 2016-06-08 NOTE — Telephone Encounter (Signed)
Pt aware she should be getting a call from [facility] to schedule appointment.

## 2016-06-10 ENCOUNTER — Institutional Professional Consult (permissible substitution): Payer: BLUE CROSS/BLUE SHIELD | Admitting: Internal Medicine

## 2016-06-12 ENCOUNTER — Encounter: Payer: Self-pay | Admitting: Internal Medicine

## 2016-06-12 ENCOUNTER — Ambulatory Visit (INDEPENDENT_AMBULATORY_CARE_PROVIDER_SITE_OTHER): Payer: BLUE CROSS/BLUE SHIELD | Admitting: Internal Medicine

## 2016-06-12 VITALS — BP 116/70 | HR 65 | Ht 65.0 in | Wt 237.8 lb

## 2016-06-12 DIAGNOSIS — O903 Peripartum cardiomyopathy: Secondary | ICD-10-CM | POA: Diagnosis not present

## 2016-06-12 DIAGNOSIS — I5022 Chronic systolic (congestive) heart failure: Secondary | ICD-10-CM | POA: Diagnosis not present

## 2016-06-12 DIAGNOSIS — I493 Ventricular premature depolarization: Secondary | ICD-10-CM

## 2016-06-12 NOTE — Patient Instructions (Addendum)
Medication Instructions: Your physician recommends that you continue on your current medications as directed. Please refer to the Current Medication list given to you today.   Labwork: None Ordered  Procedures/Testing: None Ordered at this moment. We will contact you after Dr. Caryl Comes has conferenced with Dr. Tamala Julian and Dr. Haroldine Laws.   Follow-Up: Your physician recommends that you schedule a follow-up appointment pending the conversation with Dr. Tamala Julian and Dr. Haroldine Laws   Any Additional Special Instructions Will Be Listed Below (If Applicable).     If you need a refill on your cardiac medications before your next appointment, please call your pharmacy.

## 2016-06-12 NOTE — Progress Notes (Signed)
ELECTROPHYSIOLOGY CONSULT NOTE  Patient ID: [name], MRN: [id number], DOB/AGE: 01-29-1976 41 y.o. Admit date: (Not on file) Date of Consult: 06/12/2016  Primary Physician: Mauricio Po, [employer] Primary Cardiologist: [facility] Consulting Physician CHF   Chief Complaint: ICD   HPI [name] is a 41 y.o. female  Referred for consideration of an ICD.  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 She's been managed with Entresto, carvedilol, varying and spironolactone as well as diuretics.  She has been noted to have increasing fatigue over recent months  She is obstructive sleep apnea and wears CPAP. She is morbidly obese.   DATE TEST    9/15 Echo EF 35-40%   6/17    Echo   EF 25-30 %   6/17    Cath   EF 25 %    Normal CA  12/17 Echo EF 20-25%    ECG demonstrated normal QRS 12/17  Struggles with anxiety and depression    Past Medical History:  Diagnosis Date  . Anxiety   . Arthritis    right shoulder   . CHF (congestive heart failure) ([place])   . Depression   . Diabetes mellitus without complication ([place])   . Dyspnea    comes and goes intermittently mostly with exertion   . Fibroid    age 12  . Gallstones   . Hypertension   . IBS (irritable bowel syndrome)   . OSA (obstructive sleep apnea)    on CPAP   . Ovarian cyst    1999; surgically removed  . 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 Termination of pregnancy    due to cardiac risk      Surgical History:  Past Surgical History:  Procedure Laterality Date  . ABDOMINAL SURGERY     several  . CARDIAC CATHETERIZATION N/A 11/11/2015   Procedure: Right/Left Heart Cath and Coronary Angiography;  Surgeon: Troy Sine, MD;  Location: [place] [facility];  Service: Cardiovascular;  Laterality: N/A;  . CESAREAN SECTION    . CHOLECYSTECTOMY    . COLONOSCOPY WITH PROPOFOL N/A 04/21/2016   Procedure: COLONOSCOPY WITH PROPOFOL;  Surgeon:  Jerene Bears, MD;  Location: [facility];  Service: Gastroenterology;  Laterality: N/A;  . ESOPHAGOGASTRODUODENOSCOPY (EGD) WITH PROPOFOL N/A 04/21/2016   Procedure: ESOPHAGOGASTRODUODENOSCOPY (EGD) WITH PROPOFOL;  Surgeon: Jerene Bears, MD;  Location: [facility];  Service: Gastroenterology;  Laterality: N/A;  . FOOT SURGERY Right   . LEFT HEART CATHETERIZATION WITH CORONARY ANGIOGRAM N/A 02/26/2014   Procedure: LEFT HEART CATHETERIZATION WITH CORONARY ANGIOGRAM;  Surgeon: Jettie Booze, MD;  Location: [facility] C[facility];  Service: Cardiovascular;  Laterality: N/A;  . TUBAL LIGATION  1999     Home Meds: Prior to Admission medications   Medication Sig Start Date End Date Taking? Authorizing Provider  albuterol (PROVENTIL HFA;VENTOLIN HFA) 108 (90 Base) MCG/ACT inhaler Inhale 2 puffs into the lungs every 6 (six) hours as needed for wheezing or shortness of breath. 01/10/16   Golden Circle, FNP  Armodafinil (NUVIGIL) 250 MG tablet Take 250 mg by mouth daily.    Historical Provider, MD  budesonide-formoterol (SYMBICORT) 160-4.5 MCG/ACT inhaler Inhale 2 puffs into the lungs 2 (two) times daily. 04/28/16   Golden Circle, FNP  carvedilol (COREG) 25 MG tablet Take 1 tablet (25 mg total) by mouth 2 (two) times daily with a meal. 01/10/16   Belva Crome,  MD  colestipol (COLESTID) 1 g tablet Take 2 tablets (2 g total) by mouth daily. 04/27/16   Jerene Bears, MD  cyclobenzaprine (FLEXERIL) 10 MG tablet Take 1 tablet (10 mg total) by mouth 3 (three) times daily as needed for muscle spasms. 01/10/16   Golden Circle, FNP  diazepam (VALIUM) 10 MG tablet Take 10 mg by mouth daily. 08/17/15   Historical Provider, MD  Diclofenac Sodium 2 % SOLN Apply 1 pump twice daily. Patient taking differently: Apply 1 application topically as needed (for pain).  12/03/15   Lyndal Pulley, DO  dicyclomine (BENTYL) 20 MG tablet Take 1 tablet (20 mg total) by mouth 2 (two) times daily with a meal. 04/28/16   Jerene Bears, MD    fluticasone (FLONASE) 50 MCG/ACT nasal spray Place 2 sprays into both nostrils daily as needed for allergies.     Historical Provider, MD  furosemide (LASIX) 80 MG tablet Take 80 mg by mouth 2 (two) times daily. 01/19/16   Historical Provider, MD  gabapentin (NEURONTIN) 100 MG capsule Take 100 mg by mouth daily as needed (for pain).     Historical Provider, MD  HYDROcodone-acetaminophen (NORCO/VICODIN) 5-325 MG tablet Take 1-2 tablets by mouth every 6 (six) hours as needed. 05/11/16   Kristen N Ward, DO  ivabradine (CORLANOR) 5 MG TABS tablet Take 1 tablet (5 mg total) by mouth 2 (two) times daily with a meal. 04/13/16   Belva Crome, MD  ondansetron (ZOFRAN ODT) 4 MG disintegrating tablet Take 1 tablet (4 mg total) by mouth every 8 (eight) hours as needed for nausea or vomiting. 05/11/16   Kristen N Ward, DO  potassium chloride (K-DUR) 10 MEQ tablet Take 1 tablet (10 mEq total) by mouth daily. 05/29/16 08/27/16  Belva Crome, MD  Probiotic Product (ALIGN) 4 MG CAPS Take 4 mg by mouth daily.    Historical Provider, MD  promethazine (PHENERGAN) 25 MG tablet Take 1 tablet (25 mg total) by mouth every 8 (eight) hours as needed for nausea or vomiting. 04/28/16   Golden Circle, FNP  promethazine-codeine (PHENERGAN WITH CODEINE) 6.25-10 MG/5ML syrup Take 5 mLs by mouth every 6 (six) hours as needed for cough. 04/28/16   Golden Circle, FNP  sacubitril-valsartan (ENTRESTO) 97-103 MG Take 1 tablet by mouth 2 (two) times daily. 02/19/16   Belva Crome, MD  spironolactone (ALDACTONE) 25 MG tablet Take 1 tablet (25 mg total) by mouth daily. 11/20/15   Brittainy M Simmons, PA-C  UNABLE TO FIND CPAP    Historical Provider, MD  venlafaxine XR (EFFEXOR-XR) 75 MG 24 hr capsule Take 225 mg by mouth daily with breakfast.    Historical Provider, MD  VICTOZA 18 MG/3ML SOPN Inject 0.3 mLs (1.8 mg total) into the skin daily. 05/18/16   Golden Circle, FNP  vitamin B-12 (CYANOCOBALAMIN) 500 MCG tablet Take 500 mcg by  mouth daily.    Historical Provider, MD  Vitamin D, Ergocalciferol, (DRISDOL) 50000 units CAPS capsule TAKE ONE CAPSULE BY MOUTH BY MOUTH ONCE A WEEK 04/27/16   Lyndal Pulley, DO    Allergies:  Allergies  Allergen Reactions  . Vancomycin Other (See Comments)    "did something to my kidneys"; went into kidney failure  . Other Other (See Comments)    IV Contrast Dye - made patient go into renal failure in high doses   . Aspirin Other (See Comments)    Wheezing; but patient still takes if she needs to  .  Ciprofloxacin Itching and Rash  . Sulfa Antibiotics Itching and Rash    Social History   Social History  . Marital status: Married    Spouse name: N/A  . Number of children: 1  . Years of education: N/A   Occupational History  . stay at home mom    Social History Main Topics  . Smoking status: Never Smoker  . Smokeless tobacco: Never Used  . Alcohol use No  . Drug use: No  . Sexual activity: Not Currently   Other Topics Concern  . Not on file   Social History Narrative  . No narrative on file     Family History  Problem Relation Age of Onset  . Emphysema Maternal Grandmother     smoked  . Heart disease Maternal Grandmother 70    MI  . Rheum arthritis Mother   . Allergies Daughter   . Colon cancer Neg Hx      ROS:  Please see the history of present illness.     All other systems reviewed and negative.    Physical Exam:  Blood pressure 116/70, pulse 65, height 5\' 5"  (1.651 m), weight 237 lb 12.8 oz (107.9 kg), SpO2 98 %. General: Well developed, Morbidly obese african [place] female in no acute distress. Head: Normocephalic, atraumatic, sclera non-icteric, no xanthomas, nares are without discharge. EENT: normal  Lymph Nodes:  none Neck: Negative for carotid bruits. JVD not elevated. Back:without scoliosis kyphosis Lungs: Clear bilaterally to auscultation without wheezes, rales, or rhonchi. Breathing is unlabored. Heart: Rapid and  RR with S1 S2. No    murmur . No rubs, or gallops appreciated. Abdomen: Soft, non-tender, non-distended with normoactive bowel sounds. No hepatomegaly. No rebound/guarding. No obvious abdominal masses. Msk:  Strength and tone appear normal for age. Extremities: No clubbing or cyanosis. No edema.  Distal pedal pulses are 2+ and equal bilaterally. Skin: Warm and Dry Neuro: Alert and oriented X 3. CN III-XII intact Grossly normal sensory and motor function . Psych:  Responds to questions appropriately with a normal affect. Tearful during our discussion       Labs: Cardiac Enzymes No results for input(s): CKTOTAL, CKMB, TROPONINI in the last 72 hours. CBC Lab Results  Component Value Date   WBC 6.5 05/28/2016   HGB 12.7 05/28/2016   HCT 38.2 05/28/2016   MCV 87.6 05/28/2016   PLT 230.0 05/28/2016   PROTIME: No results for input(s): LABPROT, INR in the last 72 hours. Chemistry No results for input(s): NA, K, CL, CO2, BUN, CREATININE, CALCIUM, PROT, BILITOT, ALKPHOS, ALT, AST, GLUCOSE in the last 168 hours.  Invalid input(s): LABALBU Lipids Lab Results  Component Value Date   CHOL 160 11/09/2015   HDL 58 11/09/2015   LDLCALC 88 11/09/2015   TRIG 70 11/09/2015   BNP Pro B Natriuretic peptide (BNP)  Date/Time Value Ref Range Status  02/20/2014 03:10 PM 933.6 (H) 0 - 125 pg/mL Final  02/19/2014 04:43 PM 947.6 (H) 0 - 125 pg/mL Final  10/03/2013 07:35 PM 1,319.0 (H) 0 - 125 pg/mL Final  09/29/2013 12:29 PM 129.0 (H) 0.0 - 100.0 pg/mL Final   Thyroid Function Tests: No results for input(s): TSH, T4TOTAL, T3FREE, THYROIDAB in the last 72 hours.  Invalid input(s): FREET3 Miscellaneous Lab Results  Component Value Date   DDIMER 1.79 (H) 11/08/2015    Radiology/Studies:  No results found.  EKG: sinus at 104 19/08/036  Freqa PVC  LBBB Infer Axis      Assessment and Plan:  PVCs  NICM  CHF systolic   Anxiety/depression    The pt has persistent cardiomyopathy and long standing hx  of PVCs which have been variably present over the last years ( all I looked at) tracings although she says they have bbeen noted for many years, raising the possiblility that the PVCs may be contributing to her cardiomyopathy and recent deterioration in functional status  Prior to making a decision re ICD, it would be appropriate to quantitate the PVCs and then if > 10-15% to seek a drug trial to eliminate them and then if this is possible, then to reassess LVEF in the hopes that CM improves  She is tearful but agreeable; discussed some strategies for managing the long term stress  I have spoken with both [facility] and DB who are in agreement   pwp  Virl Axe

## 2016-06-16 ENCOUNTER — Telehealth: Payer: Self-pay | Admitting: *Deleted

## 2016-06-16 ENCOUNTER — Institutional Professional Consult (permissible substitution): Payer: BLUE CROSS/BLUE SHIELD | Admitting: Internal Medicine

## 2016-06-16 DIAGNOSIS — I493 Ventricular premature depolarization: Secondary | ICD-10-CM

## 2016-06-16 NOTE — Telephone Encounter (Signed)
I left a message for the patient on her cell # to call and make her aware that a 48 hour monitor has been recommended by Dr. Caryl Comes.  No answer/ no voice mail at her home # after multiple rings.

## 2016-06-16 NOTE — Telephone Encounter (Signed)
-----   Message from Bobby Rumpf, [place] sent at 06/12/2016  4:57 PM EST ----- Regarding: 48 holter Dr. Caryl Comes talked to Dr. Haroldine Laws and Dr. Tamala Julian and they all agree to get a 48 hr holter on this patient. I did not put the order in because he told me after she left. I can if you need me to, otherwise it will need to be ordered and scheduled.   Thanks Patty Sermons

## 2016-06-16 NOTE — Telephone Encounter (Signed)
I spoke with the patient.  She is aware of Dr. Olin Pia recommendations for a 48 hour holter.  She is agreeable with this. I advised her I will place the order for this and one out our schedulers with call her to arrange. She voices understanding.

## 2016-06-17 ENCOUNTER — Institutional Professional Consult (permissible substitution): Payer: BLUE CROSS/BLUE SHIELD | Admitting: Internal Medicine

## 2016-06-22 ENCOUNTER — Other Ambulatory Visit: Payer: BLUE CROSS/BLUE SHIELD | Admitting: *Deleted

## 2016-06-22 ENCOUNTER — Ambulatory Visit (INDEPENDENT_AMBULATORY_CARE_PROVIDER_SITE_OTHER): Payer: BLUE CROSS/BLUE SHIELD

## 2016-06-22 DIAGNOSIS — I10 Essential (primary) hypertension: Secondary | ICD-10-CM

## 2016-06-22 DIAGNOSIS — I493 Ventricular premature depolarization: Secondary | ICD-10-CM

## 2016-06-22 NOTE — Addendum Note (Signed)
Addended by: Eulis Foster on: 06/22/2016 03:52 PM   Modules accepted: Orders

## 2016-06-23 ENCOUNTER — Ambulatory Visit (INDEPENDENT_AMBULATORY_CARE_PROVIDER_SITE_OTHER): Payer: BLUE CROSS/BLUE SHIELD | Admitting: Interventional Cardiology

## 2016-06-23 ENCOUNTER — Encounter: Payer: Self-pay | Admitting: Interventional Cardiology

## 2016-06-23 ENCOUNTER — Ambulatory Visit (HOSPITAL_COMMUNITY): Payer: BLUE CROSS/BLUE SHIELD | Attending: Cardiology

## 2016-06-23 ENCOUNTER — Encounter (HOSPITAL_COMMUNITY): Payer: Self-pay | Admitting: *Deleted

## 2016-06-23 VITALS — BP 102/84 | HR 84 | Ht 65.0 in | Wt 232.2 lb

## 2016-06-23 DIAGNOSIS — I5042 Chronic combined systolic (congestive) and diastolic (congestive) heart failure: Secondary | ICD-10-CM | POA: Diagnosis not present

## 2016-06-23 DIAGNOSIS — I493 Ventricular premature depolarization: Secondary | ICD-10-CM | POA: Diagnosis not present

## 2016-06-23 DIAGNOSIS — O903 Peripartum cardiomyopathy: Secondary | ICD-10-CM

## 2016-06-23 DIAGNOSIS — I10 Essential (primary) hypertension: Secondary | ICD-10-CM

## 2016-06-23 HISTORY — DX: Ventricular premature depolarization: I49.3

## 2016-06-23 LAB — BASIC METABOLIC PANEL
BUN/Creatinine Ratio: 8 — ABNORMAL LOW (ref 9–23)
BUN: 6 mg/dL (ref 6–24)
CO2: 24 mmol/L (ref 18–29)
Calcium: 9.1 mg/dL (ref 8.7–10.2)
Chloride: 102 mmol/L (ref 96–106)
Creatinine, Ser: 0.77 mg/dL (ref 0.57–1.00)
GFR calc Af Amer: 110 mL/min/{1.73_m2} (ref >59)
GFR calc non Af Amer: 96 mL/min/{1.73_m2} (ref >59)
Glucose: 77 mg/dL (ref 65–99)
Potassium: 3.9 mmol/L (ref 3.5–5.2)
Sodium: 141 mmol/L (ref 134–144)

## 2016-06-23 NOTE — Progress Notes (Signed)
Cardiology Office Note    Date:  06/23/2016   ID:  Megan, [name] 15-Jul-1975, MRN [id number]  PCP:  Mauricio Po, FNP  Cardiologist: Sinclair Grooms, MD   Chief Complaint  Patient presents with  . Congestive Heart Failure    History of Present Illness:  [name] is a 41 y.o. female  f/u of hypertension, diabetes mellitus, IBS, depression, anxiety, OSA on CPAP, IBS, DVT not on anticoagulants, chronic combined systolic and diastolic CHF.Recent hospitalization for chest pain led to coronary angiography which revealed normal vessels, moderate elevation in pulmonary artery pressures, and LVEF 25-30%.  Sujei has been into into the advanced heart failure clinic ([facility])) and seen by EP([facility]).  She has developed progressive decrease in LV function despite a rigorous heart failure regimen. 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 Her presentation occurred temporally related to delivery of her child. She denies chest pain. She has not had syncope. She feels weak today. She had to use a higher dose of furosemide yesterday to get rid of an additional 5 pounds of weight.  Dr. B is planning a cardiopulmonary function tests.  Dr. Caryl Comes as her wear a monitor because he identified increased PVCs and is concerned that decline in LV function may be related to that.   Past Medical History:  Diagnosis Date  . Anxiety   . Arthritis    right shoulder   . CHF (congestive heart failure) ([place])   . Depression   . Diabetes mellitus without complication ([place])   . Dyspnea    comes and goes intermittently mostly with exertion   . Fibroid    age 43  . Gallstones   . Hypertension   . IBS (irritable bowel syndrome)   . OSA (obstructive sleep apnea)    on CPAP   . Ovarian cyst    1999; surgically removed  . 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 Termination of pregnancy    due to cardiac risk    Past  Surgical History:  Procedure Laterality Date  . ABDOMINAL SURGERY     several  . CARDIAC CATHETERIZATION N/A 11/11/2015   Procedure: Right/Left Heart Cath and Coronary Angiography;  Surgeon: Troy Sine, MD;  Location: [facility];  Service: Cardiovascular;  Laterality: N/A;  . CESAREAN SECTION    . CHOLECYSTECTOMY    . COLONOSCOPY WITH PROPOFOL N/A 04/21/2016   Procedure: COLONOSCOPY WITH PROPOFOL;  Surgeon: Jerene Bears, MD;  Location: [facility];  Service: Gastroenterology;  Laterality: N/A;  . ESOPHAGOGASTRODUODENOSCOPY (EGD) WITH PROPOFOL N/A 04/21/2016   Procedure: ESOPHAGOGASTRODUODENOSCOPY (EGD) WITH PROPOFOL;  Surgeon: Jerene Bears, MD;  Location: [facility];  Service: Gastroenterology;  Laterality: N/A;  . FOOT SURGERY Right   . LEFT HEART CATHETERIZATION WITH CORONARY ANGIOGRAM N/A 02/26/2014   Procedure: LEFT HEART CATHETERIZATION WITH CORONARY ANGIOGRAM;  Surgeon: Jettie Booze, MD;  Location: [facility] CATH LAB;  Service: Cardiovascular;  Laterality: N/A;  . TUBAL LIGATION  1999    Current Medications: Outpatient Medications Prior to Visit  Medication Sig Dispense Refill  . albuterol (PROVENTIL HFA;VENTOLIN HFA) 108 (90 Base) MCG/ACT inhaler Inhale 2 puffs into the lungs every 6 (six) hours as needed for wheezing or shortness of breath. 1 Inhaler 0  . Armodafinil (NUVIGIL) 250 MG tablet Take 250 mg by mouth daily.    . budesonide-formoterol (SYMBICORT) 160-4.5 MCG/ACT inhaler Inhale 2 puffs into the lungs  2 (two) times daily. 1 Inhaler 3  . carvedilol (COREG) 25 MG tablet Take 1 tablet (25 mg total) by mouth 2 (two) times daily with a meal. 180 tablet 3  . colestipol (COLESTID) 1 g tablet Take 2 tablets (2 g total) by mouth daily. 60 tablet 3  . cyclobenzaprine (FLEXERIL) 10 MG tablet Take 1 tablet (10 mg total) by mouth 3 (three) times daily as needed for muscle spasms. 30 tablet 0  . Diclofenac Sodium 2 % SOLN Apply 1 pump twice daily. (Patient taking  differently: Apply 1 application topically as needed (for pain). ) 112 g 3  . dicyclomine (BENTYL) 20 MG tablet Take 1 tablet (20 mg total) by mouth 2 (two) times daily with a meal. 60 tablet 2  . fluticasone (FLONASE) 50 MCG/ACT nasal spray Place 2 sprays into both nostrils daily as needed for allergies.     . furosemide (LASIX) 80 MG tablet Take 80 mg by mouth 2 (two) times daily.    Marland Kitchen gabapentin (NEURONTIN) 100 MG capsule Take 100 mg by mouth daily as needed (for pain).     . ivabradine (CORLANOR) 5 MG TABS tablet Take 1 tablet (5 mg total) by mouth 2 (two) times daily with a meal. 180 tablet 3  . ondansetron (ZOFRAN ODT) 4 MG disintegrating tablet Take 1 tablet (4 mg total) by mouth every 8 (eight) hours as needed for nausea or vomiting. 20 tablet 0  . potassium chloride (K-DUR) 10 MEQ tablet Take 1 tablet (10 mEq total) by mouth daily. 90 tablet 3  . Probiotic Product (ALIGN) 4 MG CAPS Take 4 mg by mouth daily.    . promethazine-codeine (PHENERGAN WITH CODEINE) 6.25-10 MG/5ML syrup Take 5 mLs by mouth every 6 (six) hours as needed for cough. 180 mL 0  . sacubitril-valsartan (ENTRESTO) 97-103 MG Take 1 tablet by mouth 2 (two) times daily. 60 tablet 11  . spironolactone (ALDACTONE) 25 MG tablet Take 1 tablet (25 mg total) by mouth daily. 90 tablet 3  . UNABLE TO FIND CPAP    . venlafaxine XR (EFFEXOR-XR) 75 MG 24 hr capsule Take 225 mg by mouth daily with breakfast.    . VICTOZA 18 MG/3ML SOPN Inject 0.3 mLs (1.8 mg total) into the skin daily. 27 mL 0  . vitamin B-12 (CYANOCOBALAMIN) 500 MCG tablet Take 500 mcg by mouth daily.    . Vitamin D, Ergocalciferol, (DRISDOL) 50000 units CAPS capsule TAKE ONE CAPSULE BY MOUTH BY MOUTH ONCE A WEEK 4 capsule 1  . promethazine (PHENERGAN) 25 MG tablet Take 1 tablet (25 mg total) by mouth every 8 (eight) hours as needed for nausea or vomiting. (Patient not taking: Reported on 06/23/2016) 30 tablet 0   No facility-administered medications prior to visit.       Allergies:   Vancomycin; Other; Aspirin; Ciprofloxacin; and Sulfa antibiotics   Social History   Social History  . Marital status: Married    Spouse name: N/A  . Number of children: 1  . Years of education: N/A   Occupational History  . stay at home mom    Social History Main Topics  . Smoking status: Never Smoker  . Smokeless tobacco: Never Used  . Alcohol use No  . Drug use: No  . Sexual activity: Not Currently   Other Topics Concern  . None   Social History Narrative  . None     Family History:  The patient's family history includes Allergies in her daughter; Emphysema in her  maternal grandmother; Heart disease (age of onset: 76) in her maternal grandmother; Rheum arthritis in her mother.   ROS:   Please see the history of present illness.    Change in appetite, excessive fatigue, chest pressure, anxiety, depression, irregular heart beat. When having PVCs though she is unable to feel palpitations.  All other systems reviewed and are negative.   PHYSICAL EXAM:   VS:  BP 102/84 (BP Location: Left Arm)   Pulse 84   Ht 5\' 5"  (1.651 m)   Wt 232 lb 3.2 oz (105.3 kg)   BMI 38.64 kg/m    GEN: Well nourished, well developed, in no acute distress  HEENT: normal  Neck: no JVD, carotid bruits, or masses Cardiac: RRR; no murmurs, rubs, or gallops,no edema . Possibly soft S3 gallop. Respiratory:  clear to auscultation bilaterally, normal work of breathing GI: soft, nontender, nondistended, + BS MS: no deformity or atrophy  Skin: warm and dry, no rash Neuro:  Alert and Oriented x 3, Strength and sensation are intact Psych: euthymic mood, full affect  Wt Readings from Last 3 Encounters:  06/23/16 232 lb 3.2 oz (105.3 kg)  06/12/16 237 lb 12.8 oz (107.9 kg)  06/05/16 237 lb 12 oz (107.8 kg)      Studies/Labs Reviewed:   EKG:  EKG  The EKG is not updated. The tracing performed when she saw Dr. Caryl Comes on 06/12/16 revealed ventricular bigeminy.  Recent  Labs: 11/10/2015: Magnesium 2.1 05/11/2016: B Natriuretic Peptide 53.8 05/28/2016: ALT 8; Hemoglobin 12.7; Platelets 230.0; TSH 1.09 06/22/2016: BUN 6; Creatinine, Ser 0.77; Potassium 3.9; Sodium 141   Lipid Panel    Component Value Date/Time   CHOL 160 11/09/2015 0526   TRIG 70 11/09/2015 0526   HDL 58 11/09/2015 0526   CHOLHDL 2.8 11/09/2015 0526   VLDL 14 11/09/2015 0526   LDLCALC 88 11/09/2015 0526    Additional studies/ records that were reviewed today include:  48-hour Holter is pending.  Cardiopulmonary function test is pending.    ASSESSMENT:    1. Postpartum cardiomyopathy   2. Chronic combined systolic and diastolic heart failure ([facility])   3. Essential hypertension   4. Premature ventricular contractions      PLAN:  In order of problems listed above:  1. Severe LV systolic dysfunction presumed related to postpartum cardiomyopathy. Now being seen in the advanced heart failure clinic. 2. Appears euvolemic today. 3. Excellent blood pressure control. Reiterated low salt diet. 4. This is being evaluated with monitoring. If high density of PVCs are noted, perhaps antiarrhythmic therapy will be asked to to which may help improve LV function.    Medication Adjustments/Labs and Tests Ordered: Current medicines are reviewed at length with the patient today.  Concerns regarding medicines are outlined above.  Medication changes, Labs and Tests ordered today are listed in the Patient Instructions below. Patient Instructions  Medication Instructions:  None  Labwork: None  Testing/Procedures: None  Follow-Up: Your physician wants you to follow-up in: 6 months with Dr. Tamala Julian.  You will receive a reminder letter in the mail two months in advance. If you don't receive a letter, please call our office to schedule the follow-up appointment.   Any Other Special Instructions Will Be Listed Below (If Applicable).     If you need a refill on your cardiac medications before  your next appointment, please call your pharmacy.      Signed, Sinclair Grooms, MD  06/23/2016 10:48 AM    [facility] [place], [place], [place]  [id number] Phone: [phone]; Fax: [phone]

## 2016-06-23 NOTE — Patient Instructions (Signed)
Medication Instructions:  None  Labwork: None  Testing/Procedures: None  Follow-Up: Your physician wants you to follow-up in: 6 months with Dr. Smith.  You will receive a reminder letter in the mail two months in advance. If you don't receive a letter, please call our office to schedule the follow-up appointment.   Any Other Special Instructions Will Be Listed Below (If Applicable).     If you need a refill on your cardiac medications before your next appointment, please call your pharmacy.   

## 2016-06-24 ENCOUNTER — Emergency Department (HOSPITAL_COMMUNITY): Payer: BLUE CROSS/BLUE SHIELD

## 2016-06-24 ENCOUNTER — Encounter (HOSPITAL_COMMUNITY): Payer: Self-pay

## 2016-06-24 ENCOUNTER — Emergency Department (HOSPITAL_COMMUNITY)
Admission: EM | Admit: 2016-06-24 | Discharge: 2016-06-24 | Disposition: A | Discharge status: Home / Self Care | Payer: BLUE CROSS/BLUE SHIELD | Attending: Emergency Medicine | Admitting: Emergency Medicine

## 2016-06-24 DIAGNOSIS — Z79899 Other long term (current) drug therapy: Secondary | ICD-10-CM | POA: Diagnosis not present

## 2016-06-24 DIAGNOSIS — E119 Type 2 diabetes mellitus without complications: Secondary | ICD-10-CM | POA: Insufficient documentation

## 2016-06-24 DIAGNOSIS — I110 Hypertensive heart disease with heart failure: Secondary | ICD-10-CM | POA: Diagnosis not present

## 2016-06-24 DIAGNOSIS — R0789 Other chest pain: Secondary | ICD-10-CM | POA: Diagnosis present

## 2016-06-24 DIAGNOSIS — I5042 Chronic combined systolic (congestive) and diastolic (congestive) heart failure: Secondary | ICD-10-CM | POA: Insufficient documentation

## 2016-06-24 DIAGNOSIS — R079 Chest pain, unspecified: Secondary | ICD-10-CM

## 2016-06-24 LAB — CBC
HCT: 41.8 % (ref 36.0–46.0)
Hemoglobin: 13.6 g/dL (ref 12.0–15.0)
MCH: 28.8 pg (ref 26.0–34.0)
MCHC: 32.5 g/dL (ref 30.0–36.0)
MCV: 88.4 fL (ref 78.0–100.0)
Platelets: 248 10*3/uL (ref 150–400)
RBC: 4.73 MIL/uL (ref 3.87–5.11)
RDW: 13.3 % (ref 11.5–15.5)
WBC: 6.8 10*3/uL (ref 4.0–10.5)

## 2016-06-24 LAB — I-STAT TROPONIN, ED: Troponin i, poc: 0 ng/mL (ref 0.00–0.08)

## 2016-06-24 LAB — BASIC METABOLIC PANEL
Anion gap: 12 (ref 5–15)
BUN: 8 mg/dL (ref 6–20)
CO2: 26 mmol/L (ref 22–32)
Calcium: 9.6 mg/dL (ref 8.9–10.3)
Chloride: 100 mmol/L — ABNORMAL LOW (ref 101–111)
Creatinine, Ser: 0.93 mg/dL (ref 0.44–1.00)
GFR calc Af Amer: >60 mL/min (ref >60)
GFR calc non Af Amer: >60 mL/min (ref >60)
Glucose, Bld: 90 mg/dL (ref 65–99)
Potassium: 3.5 mmol/L (ref 3.5–5.1)
Sodium: 138 mmol/L (ref 135–145)

## 2016-06-24 LAB — POC URINE PREG, ED: Preg Test, Ur: NEGATIVE

## 2016-06-24 LAB — BRAIN NATRIURETIC PEPTIDE: B Natriuretic Peptide: 36.7 pg/mL (ref 0.0–100.0)

## 2016-06-24 NOTE — ED Notes (Signed)
Pt given apple juice  

## 2016-06-24 NOTE — ED Notes (Addendum)
Pt reports she is having chest pains for the past 2 days. Pt reports dizziness and the feeling as though she is going to pass out. Pt reports she was at the Heart Failure clinic when this all occurred.

## 2016-06-24 NOTE — Discharge Instructions (Signed)
Return here as needed.  Follow-up with your cardiologist. °

## 2016-06-24 NOTE — ED Triage Notes (Signed)
Pt states she has had central chest pressures since yesterday; pt states she was seen by PCP yesterday and today for same symptoms; pt denies any new sx; pt a&ox 4 on arrival. Pt speaking in full sentences;

## 2016-06-27 NOTE — ED Provider Notes (Signed)
[facility] DEPT [facility] Provider Note   CSN: [id number] Arrival date & time: 06/24/16  0001     History   Chief Complaint Chief Complaint  Patient presents with  . Chest Pain    HPI [name] is a 41 y.o. female.  HPI Patient presents to the emergency department with chest discomfort that bilateral.  He was seen by her cardiologist today for this and she states that her blood pressure was lower than normal during that visit, they did not do the stress test because of this.  The patient states that she has not have any shortness of breath.  Patient states she has not noticed significant edema in her legs or abdomen. The patient denies  shortness of breath, headache,blurred vision, neck pain, fever, cough, weakness, numbness, dizziness, anorexia, edema, abdominal pain, nausea, vomiting, diarrhea, rash, back pain, dysuria, hematemesis, bloody stool, near syncope, or syncope. Past Medical History:  Diagnosis Date  . Anxiety   . Arthritis    right shoulder   . CHF (congestive heart failure) ([place])   . Depression   . Diabetes mellitus without complication ([place])   . Dyspnea    comes and goes intermittently mostly with exertion   . Fibroid    age 34  . Gallstones   . Hypertension   . IBS (irritable bowel syndrome)   . OSA (obstructive sleep apnea)    on CPAP   . Ovarian cyst    1999; surgically removed  . 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 Termination of pregnancy    due to cardiac risk    Patient Active Problem List   Diagnosis Date Noted  . Premature ventricular contractions 06/23/2016  . General weakness 05/28/2016  . Urinary frequency 05/28/2016  . Hyperglycemia 05/28/2016  . Diarrhea   . Dyspepsia   . Generalized abdominal cramping   . Abnormal nuclear stress test   . Diabetes mellitus without complication ([place])   . Labral tear of shoulder 04/04/2015  . Acute upper respiratory infection 11/06/2014  . 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 Subacromial bursitis 07/16/2014  . Iliotibial band syndrome of right side 07/16/2014  . Chronic combined systolic and diastolic heart failure ([place]) 03/05/2014  . Closed low lateral malleolus fracture 10/23/2013  . Thoracic back pain 06/20/2013  . IBS (irritable bowel syndrome)   . Depression with anxiety 01/20/2013  . OSA (obstructive sleep apnea) 01/02/2013  . 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 Essential hypertension     Past Surgical History:  Procedure Laterality Date  . ABDOMINAL SURGERY     several  . CARDIAC CATHETERIZATION N/A 11/11/2015   Procedure: Right/Left Heart Cath and Coronary Angiography;  Surgeon: Troy Sine, MD;  Location: [facility];  Service: Cardiovascular;  Laterality: N/A;  . CESAREAN SECTION    . CHOLECYSTECTOMY    . COLONOSCOPY WITH PROPOFOL N/A 04/21/2016   Procedure: COLONOSCOPY WITH PROPOFOL;  Surgeon: Jerene Bears, MD;  [facility];  Service: Gastroenterology;  Laterality: N/A;  . ESOPHAGOGASTRODUODENOSCOPY (EGD) WITH PROPOFOL N/A 04/21/2016   Procedure: ESOPHAGOGASTRODUODENOSCOPY (EGD) WITH PROPOFOL;  Surgeon: Jerene Bears, MD;  Location: [facility];  Service: Gastroenterology;  Laterality: N/A;  . FOOT SURGERY Right   . LEFT HEART CATHETERIZATION WITH CORONARY ANGIOGRAM N/A 02/26/2014   Procedure: LEFT HEART CATHETERIZATION WITH CORONARY ANGIOGRAM;  Surgeon: Jettie Booze, MD;  Location: [facility] CATH LAB;  Service: Cardiovascular;  Laterality: N/A;  . [place]  OB History    Gravida Para Term Preterm AB Living   2 1     1 1    SAB TAB Ectopic Multiple Live Births     1             Home Medications    Prior to Admission medications   Medication Sig Start Date End Date Taking? Authorizing Provider  albuterol (PROVENTIL HFA;VENTOLIN HFA) 108 (90 Base) MCG/ACT inhaler Inhale 2 puffs into the lungs every 6 (six) hours as needed for wheezing or shortness of breath. 01/10/16  Yes Golden Circle, FNP  Armodafinil (NUVIGIL) 250 MG tablet Take 250 mg by mouth daily.   Yes Historical Provider, MD  budesonide-formoterol (SYMBICORT) 160-4.5 MCG/ACT inhaler Inhale 2 puffs into the lungs 2 (two) times daily. 04/28/16  Yes Golden Circle, FNP  carvedilol (COREG) 25 MG tablet Take 1 tablet (25 mg total) by mouth 2 (two) times daily with a meal. 01/10/16  Yes Belva Crome, MD  colestipol (COLESTID) 1 g tablet Take 2 tablets (2 g total) by mouth daily. 04/27/16  Yes Jerene Bears, MD  cyclobenzaprine (FLEXERIL) 10 MG tablet Take 1 tablet (10 mg total) by mouth 3 (three) times daily as needed for muscle spasms. 01/10/16  Yes Golden Circle, FNP  Diclofenac Sodium 2 % SOLN Apply 1 pump twice daily. Patient taking differently: Apply 1 application topically as needed (for pain).  12/03/15  Yes Lyndal Pulley, DO  dicyclomine (BENTYL) 20 MG tablet Take 1 tablet (20 mg total) by mouth 2 (two) times daily with a meal. 04/28/16  Yes Jerene Bears, MD  fluticasone (FLONASE) 50 MCG/ACT nasal spray Place 2 sprays into both nostrils daily as needed for allergies.    Yes Historical Provider, MD  furosemide (LASIX) 80 MG tablet Take 80 mg by mouth 2 (two) times daily. 01/19/16  Yes Historical Provider, MD  gabapentin (NEURONTIN) 100 MG capsule Take 100 mg by mouth daily as needed (for pain).    Yes Historical Provider, MD  ivabradine (CORLANOR) 5 MG TABS tablet Take 1 tablet (5 mg total) by mouth 2 (two) times daily with a meal. 04/13/16  Yes Belva Crome, MD  ondansetron (ZOFRAN ODT) 4 MG disintegrating tablet Take 1 tablet (4 mg total) by mouth every 8 (eight) hours as needed for nausea or vomiting. 05/11/16  Yes Kristen N Ward, DO  potassium chloride (K-DUR) 10 MEQ tablet Take 1 tablet (10 mEq total) by mouth daily. 05/29/16 08/27/16 Yes Belva Crome, MD  Probiotic Product (ALIGN) 4 MG CAPS Take 4 mg by mouth daily.   Yes Historical Provider, MD  sacubitril-valsartan (ENTRESTO) 97-103 MG Take 1 tablet by mouth  2 (two) times daily. 02/19/16  Yes Belva Crome, MD  spironolactone (ALDACTONE) 25 MG tablet Take 1 tablet (25 mg total) by mouth daily. 11/20/15  Yes Brittainy Erie Noe, PA-C  venlafaxine XR (EFFEXOR-XR) 75 MG 24 hr capsule Take 225 mg by mouth daily with breakfast.   Yes Historical Provider, MD  VICTOZA 18 MG/3ML SOPN Inject 0.3 mLs (1.8 mg total) into the skin daily. 05/18/16  Yes Golden Circle, FNP  vitamin B-12 (CYANOCOBALAMIN) 500 MCG tablet Take 500 mcg by mouth daily.   Yes Historical Provider, MD  Vitamin D, Ergocalciferol, (DRISDOL) 50000 units CAPS capsule TAKE ONE CAPSULE BY MOUTH BY MOUTH ONCE A WEEK 04/27/16  Yes Lyndal Pulley, DO  promethazine-codeine (PHENERGAN WITH CODEINE) 6.25-10 MG/5ML syrup Take 5 mLs by  mouth every 6 (six) hours as needed for cough. Patient not taking: Reported on 06/24/2016 04/28/16   Golden Circle, FNP  UNABLE TO FIND CPAP    Historical Provider, MD    Family History Family History  Problem Relation Age of Onset  . Emphysema Maternal Grandmother     smoked  . Heart disease Maternal Grandmother 20    MI  . Rheum arthritis Mother   . Allergies Daughter   . Colon cancer Neg Hx     Social History Social History  Substance Use Topics  . Smoking status: Never Smoker  . Smokeless tobacco: Never Used  . Alcohol use No     Allergies   Vancomycin; Other; Aspirin; Ciprofloxacin; and Sulfa antibiotics   Review of Systems Review of Systems All other systems negative except as documented in the HPI. All pertinent positives and negatives as reviewed in the HPI.  Physical Exam Updated Vital Signs BP 103/66   Pulse 84   Temp 97.3 F (36.3 C) (Oral)   Resp 15   Ht 5\' 5"  (1.651 m)   Wt 104.8 kg   LMP 06/06/2016   SpO2 99%   BMI 38.44 kg/m   Physical Exam  Constitutional: She is oriented to person, place, and time. She appears well-developed and well-nourished. No distress.  HENT:  Head: Normocephalic and atraumatic.  Mouth/Throat:  Oropharynx is clear and moist.  Eyes: Pupils are equal, round, and reactive to light.  Neck: Normal range of motion. Neck supple.  Cardiovascular: Normal rate, regular rhythm and normal heart sounds.  Exam reveals no gallop and no friction rub.   No murmur heard. Pulmonary/Chest: Effort normal and breath sounds normal. No respiratory distress. She has no wheezes.  Abdominal: Soft. Bowel sounds are normal. She exhibits no distension. There is no tenderness.  Neurological: She is alert and oriented to person, place, and time. She exhibits normal muscle tone. Coordination normal.  Skin: Skin is warm and dry. No rash noted. No erythema.  Psychiatric: She has a normal mood and affect. Her behavior is normal.  Nursing note and vitals reviewed.    ED Treatments / Results  Labs (all labs ordered are listed, but only abnormal results are displayed) Labs Reviewed  BASIC METABOLIC PANEL - Abnormal; Notable for the following:       Result Value   Chloride 100 (*)    All other components within normal limits  CBC  BRAIN NATRIURETIC PEPTIDE  I-STAT TROPOININ, ED  POC URINE PREG, ED    EKG  EKG Interpretation  Date/Time:  Wednesday June 24 2016 00:10:49 EST Ventricular Rate:  90 PR Interval:  190 QRS Duration: 80 QT Interval:  388 QTC Calculation: 474 R Axis:   15 Text Interpretation:  Normal sinus rhythm Anterior infarct , age undetermined Abnormal ECG When compared with ECG of 05/11/2016, No significant change was found Confirmed by [facility]  MD, DAVID ([id number]) on 06/24/2016 12:09:14 AM       Radiology No results found.  Procedures Procedures (including critical care time)  Medications Ordered in ED Medications - No data to display   Initial Impression / Assessment and Plan / ED Course  I have reviewed the triage vital signs and the nursing notes.  Pertinent labs & imaging results that were available during my care of the patient were reviewed by me and considered in my  medical decision making (see chart for details).     Patient will be referred back to her cardiologist.  I  feel that she had orthostatic hypotension but has improved here in the emergency department due to an increase Lasix dose yesterday.  The patient is feeling better at this time, we will have her follow-up with her cardiologist.  Told to return here as needed   Final Clinical Impressions(s) / ED Diagnoses   Final diagnoses:  Nonspecific chest pain    New Prescriptions Discharge Medication List as of 06/24/2016  5:14 AM       Dalia Heading, PA-C [id number]    Delora Fuel, MD [id number]

## 2016-07-02 ENCOUNTER — Ambulatory Visit (HOSPITAL_COMMUNITY): Payer: BLUE CROSS/BLUE SHIELD | Attending: Cardiology

## 2016-07-02 DIAGNOSIS — I5042 Chronic combined systolic (congestive) and diastolic (congestive) heart failure: Secondary | ICD-10-CM | POA: Diagnosis present

## 2016-07-02 DIAGNOSIS — I493 Ventricular premature depolarization: Secondary | ICD-10-CM | POA: Diagnosis not present

## 2016-07-03 ENCOUNTER — Other Ambulatory Visit (HOSPITAL_COMMUNITY): Payer: Self-pay | Admitting: *Deleted

## 2016-07-03 DIAGNOSIS — I5042 Chronic combined systolic (congestive) and diastolic (congestive) heart failure: Secondary | ICD-10-CM

## 2016-07-10 ENCOUNTER — Encounter (HOSPITAL_COMMUNITY): Payer: Self-pay | Admitting: Internal Medicine

## 2016-07-10 ENCOUNTER — Ambulatory Visit (HOSPITAL_COMMUNITY)
Admission: RE | Admit: 2016-07-10 | Discharge: 2016-07-10 | Disposition: A | Discharge status: Home / Self Care | Payer: BLUE CROSS/BLUE SHIELD | Source: Ambulatory Visit | Attending: Internal Medicine | Admitting: Internal Medicine

## 2016-07-10 ENCOUNTER — Telehealth (HOSPITAL_COMMUNITY): Payer: Self-pay | Admitting: *Deleted

## 2016-07-10 VITALS — BP 114/78 | HR 71 | Wt 233.0 lb

## 2016-07-10 DIAGNOSIS — Z6838 Body mass index (BMI) 38.0-38.9, adult: Secondary | ICD-10-CM | POA: Insufficient documentation

## 2016-07-10 DIAGNOSIS — K589 Irritable bowel syndrome without diarrhea: Secondary | ICD-10-CM | POA: Insufficient documentation

## 2016-07-10 DIAGNOSIS — I110 Hypertensive heart disease with heart failure: Secondary | ICD-10-CM | POA: Diagnosis not present

## 2016-07-10 DIAGNOSIS — E119 Type 2 diabetes mellitus without complications: Secondary | ICD-10-CM | POA: Insufficient documentation

## 2016-07-10 DIAGNOSIS — F329 Major depressive disorder, single episode, unspecified: Secondary | ICD-10-CM | POA: Diagnosis not present

## 2016-07-10 DIAGNOSIS — Z79899 Other long term (current) drug therapy: Secondary | ICD-10-CM | POA: Insufficient documentation

## 2016-07-10 DIAGNOSIS — G4733 Obstructive sleep apnea (adult) (pediatric): Secondary | ICD-10-CM | POA: Diagnosis not present

## 2016-07-10 DIAGNOSIS — F419 Anxiety disorder, unspecified: Secondary | ICD-10-CM | POA: Insufficient documentation

## 2016-07-10 DIAGNOSIS — I493 Ventricular premature depolarization: Secondary | ICD-10-CM | POA: Diagnosis not present

## 2016-07-10 DIAGNOSIS — I5022 Chronic systolic (congestive) heart failure: Secondary | ICD-10-CM

## 2016-07-10 MED ORDER — IVABRADINE HCL 5 MG PO TABS: 7.5000 mg | ORAL_TABLET | Freq: Two times a day (BID) | ORAL | 3 refills | Status: DC

## 2016-07-10 NOTE — Patient Instructions (Addendum)
Increase Ivabradine to 7.5 mg (1.5 Tablets) Two times Daily  Cardiac Rehab has been ordered for you, they will contact you for your initial appointment.  Dr. Olin Pia office will contact you regarding ICD placement.   Follow up in 3 months.

## 2016-07-10 NOTE — Addendum Note (Signed)
Encounter addended by: Kennieth Rad, RN on: 07/10/2016  2:50 PM<BR>    Actions taken: Order list changed, Sign clinical note

## 2016-07-10 NOTE — Progress Notes (Signed)
Advanced [facility] Clinic Note  Date:  07/10/2016   ID:  [name], DOB January 24, 1976, MRN [id number]  PCP:  Mauricio Po, FNP  Cardiologist: Pernell Dupre, MD   History of Present Illness:  [name] is a 41 y.o. 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   Cath 6/17 revealed normal vessels, moderate elevation in pulmonary artery pressures, and LVEF 25-30%.  RHC 6/17 RA 5 RV 35/6 PA 41/12 PW 11 Fick 6.4/3.0 PA sat 62%  Echo 6/15 35-40% Echo 6/17 25-30% Echo 12/17 20-25%  CPX 2/8  FVC 2.57 (79%)    FEV1 2.14 (81%)     FEV1/FVC 83 (101%)     MVV 107 (102%) Resting HR: 106 Peak HR: 166  (93% age predicted max HR)  BP rest: 136/98 BP peak: 174/88 Peak VO2: 17.1 (85% predicted peak VO2) - corrects to 28.4 for ibw VE/VCO2 slope: 33 OUES: 2.16  Peak RER: 1.09 Ventilatory Threshold: 14.6 (73% predicted or measured peak VO2) VE/MVV: 59% PETCO2 at peak: 33 O2pulse: 10  (83% predicted O2pulse)   She has been referred by Dr. Tamala Julian for further HF evaluation. She is on full dose GDMT including carvedilol 25 bid, entresto 97/10 bid, sprio 25, ivabradine 5 bid. Takes lasix 80 bid. Since we saw her, she has seen Dr. Caryl Comes and there was some concern for PVC related CM. Wore 48-holter and only 2% PVCs. Has not been scheduled for ICD yet. Has had CPX testing with only mild HF limitation. Able to do all ADLs and walk the dog without problem. Still struggles with steps. No orthopnea, PND, edema. Gets a little dizzy at times. Feels corlanor has really helped. Compliant with CPAP.   Past Medical History:  Diagnosis Date  . Anxiety   . Arthritis    right shoulder   . CHF (congestive heart failure) ([place])   . Depression   . Diabetes mellitus without complication ([place])   . Dyspnea      comes and goes intermittently mostly with exertion   . Fibroid    age 36  . Gallstones   . Hypertension   . IBS (irritable bowel syndrome)   . OSA (obstructive sleep apnea)    on CPAP   . Ovarian cyst    1999; surgically removed  . 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 Termination of pregnancy    due to cardiac risk    Past Surgical History:  Procedure Laterality Date  . ABDOMINAL SURGERY     several  . CARDIAC CATHETERIZATION N/A 11/11/2015   Procedure: Right/Left Heart Cath and Coronary Angiography;  Surgeon: Troy Sine, MD;  Location: [facility];  Service: Cardiovascular;  Laterality: N/A;  . CESAREAN SECTION    . CHOLECYSTECTOMY    . COLONOSCOPY WITH PROPOFOL N/A 04/21/2016   Procedure: COLONOSCOPY WITH PROPOFOL;  Surgeon: Jerene Bears, MD;  Location: [facility];  Service: Gastroenterology;  Laterality: N/A;  . ESOPHAGOGASTRODUODENOSCOPY (EGD) WITH PROPOFOL N/A 04/21/2016   Procedure: ESOPHAGOGASTRODUODENOSCOPY (EGD) WITH PROPOFOL;  Surgeon: Jerene Bears, MD;  Location: [facility];  Service: Gastroenterology;  Laterality: N/A;  . FOOT SURGERY Right   . LEFT HEART CATHETERIZATION WITH CORONARY ANGIOGRAM N/A 02/26/2014   Procedure: LEFT HEART CATHETERIZATION WITH CORONARY ANGIOGRAM;  Surgeon: Jettie Booze, MD;  Location: [facility];  Service: Cardiovascular;  Laterality: N/A;  . TUBAL LIGATION  1999    Current Medications:  Current Meds  Medication Sig  . albuterol (PROVENTIL HFA;VENTOLIN HFA) 108 (90 Base) MCG/ACT inhaler Inhale 2 puffs into the lungs every 6 (six) hours as needed for wheezing or shortness of breath.  . Armodafinil (NUVIGIL) 250 MG tablet Take 250 mg by mouth daily.  . budesonide-formoterol (SYMBICORT) 160-4.5 MCG/ACT inhaler Inhale 2 puffs into the lungs 2 (two) times daily.  . carvedilol (COREG) 25 MG tablet Take 1 tablet (25 mg total) by mouth 2 (two) times daily with a meal.  . colestipol (COLESTID) 1 g  tablet Take 2 tablets (2 g total) by mouth daily.  . cyclobenzaprine (FLEXERIL) 10 MG tablet Take 1 tablet (10 mg total) by mouth 3 (three) times daily as needed for muscle spasms.  . Diclofenac Sodium 2 % SOLN Apply 1 pump twice daily. (Patient taking differently: Apply 1 application topically as needed (for pain). )  . dicyclomine (BENTYL) 20 MG tablet Take 1 tablet (20 mg total) by mouth 2 (two) times daily with a meal.  . fluticasone (FLONASE) 50 MCG/ACT nasal spray Place 2 sprays into both nostrils daily as needed for allergies.   . furosemide (LASIX) 80 MG tablet Take 80 mg by mouth 2 (two) times daily.  Marland Kitchen gabapentin (NEURONTIN) 100 MG capsule Take 100 mg by mouth daily as needed (for pain).   . ivabradine (CORLANOR) 5 MG TABS tablet Take 1 tablet (5 mg total) by mouth 2 (two) times daily with a meal.  . ondansetron (ZOFRAN ODT) 4 MG disintegrating tablet Take 1 tablet (4 mg total) by mouth every 8 (eight) hours as needed for nausea or vomiting.  . potassium chloride (K-DUR) 10 MEQ tablet Take 1 tablet (10 mEq total) by mouth daily.  . Probiotic Product (ALIGN) 4 MG CAPS Take 4 mg by mouth daily.  . sacubitril-valsartan (ENTRESTO) 97-103 MG Take 1 tablet by mouth 2 (two) times daily.  Marland Kitchen spironolactone (ALDACTONE) 25 MG tablet Take 1 tablet (25 mg total) by mouth daily.  Marland Kitchen UNABLE TO FIND CPAP  . venlafaxine XR (EFFEXOR-XR) 75 MG 24 hr capsule Take 225 mg by mouth daily with breakfast.  . VICTOZA 18 MG/3ML SOPN Inject 0.3 mLs (1.8 mg total) into the skin daily.  . vitamin B-12 (CYANOCOBALAMIN) 500 MCG tablet Take 500 mcg by mouth daily.  . Vitamin D, Ergocalciferol, (DRISDOL) 50000 units CAPS capsule TAKE ONE CAPSULE BY MOUTH BY MOUTH ONCE A WEEK    Allergies:   Vancomycin; Other; Aspirin; Ciprofloxacin; and Sulfa antibiotics   Social History   Social History  . Marital status: Married    Spouse name: N/A  . Number of children: 1  . Years of education: N/A   Occupational History  .  stay at home mom    Social History Main Topics  . Smoking status: Never Smoker  . Smokeless tobacco: Never Used  . Alcohol use No  . Drug use: No  . Sexual activity: Not Currently   Other Topics Concern  . None   Social History Narrative  . None     Family History:  The patient's family history includes Allergies in her daughter; Emphysema in her maternal grandmother; Heart disease (age of onset: 64) in her maternal grandmother; Rheum arthritis in her mother.    PHYSICAL EXAM:   VS:  BP 114/78 (BP Location: Right Arm, Patient Position: Sitting, Cuff Size: Normal)  Pulse 71   Wt 233 lb (105.7 kg)   SpO2 98%   BMI 38.77 kg/m    GEN: Well nourished, well developed, in no acute distress HEENT: normal  Neck: no JVD, carotid bruits, or masses Cardiac: RRR; no murmurs, rubs, or gallops Respiratory:  clear to auscultation bilaterally, normal work of breathing GI: obese. soft, nontender, nondistended, + BS Extremities: no cyanosis, clubbing or edema.  MS: no deformity or atrophy  Skin: warm and dry, no rash Neuro:  Alert and Oriented x 3, Strength and sensation are intact Psych: relatively flat affect.   Wt Readings from Last 3 Encounters:  07/10/16 233 lb (105.7 kg)  06/24/16 231 lb (104.8 kg)  06/23/16 232 lb 3.2 oz (105.3 kg)      Studies/Labs Reviewed:    Recent Labs: 11/10/2015: Magnesium 2.1 05/28/2016: ALT 8; TSH 1.09 06/24/2016: B Natriuretic Peptide 36.7; BUN 8; Creatinine, Ser 0.93; Hemoglobin 13.6; Platelets 248; Potassium 3.5; Sodium 138   Lipid Panel    Component Value Date/Time   CHOL 160 11/09/2015 0526   TRIG 70 11/09/2015 0526   HDL 58 11/09/2015 0526   CHOLHDL 2.8 11/09/2015 0526   VLDL 14 11/09/2015 0526   LDLCALC 88 11/09/2015 0526    Additional studies/ records that were reviewed today include:  No recent laboratory data.    ASSESSMENT:    1. Chronic systolic HF due to peri-partum CM, onset 1999 --EF 20-25% (12/17) --NYHA II symptoms  despite excellent medical regimen --Volume status looks good 2. Morbid obesity 3. OSA -reports compliance with CPAP 4. Anxiety/depression 5. DM2  Overall much improved. NYHA II. Volume status looks good. CPX test reviewed. Mild HF limitation. Recent holter monitor with only 2% PVC burden. Will Increase ivabradine to 7.5 bid. Refer back to Dr. Caryl Comes for ICD. Enroll in cardiac rehab. Continue to watch diet and work on weight loss.   Glori Bickers, MD  07/10/2016 2:25 PM

## 2016-07-14 ENCOUNTER — Telehealth: Payer: Self-pay | Admitting: *Deleted

## 2016-07-14 ENCOUNTER — Encounter: Payer: Self-pay | Admitting: *Deleted

## 2016-07-14 DIAGNOSIS — Z01812 Encounter for preprocedural laboratory examination: Secondary | ICD-10-CM

## 2016-07-14 DIAGNOSIS — I428 Other cardiomyopathies: Secondary | ICD-10-CM

## 2016-07-14 DIAGNOSIS — I5022 Chronic systolic (congestive) heart failure: Secondary | ICD-10-CM

## 2016-07-14 NOTE — Telephone Encounter (Signed)
-----   Message from Kennieth Rad, RN sent at 07/10/2016  2:44 PM EST ----- Regarding: ICD Placement Dr. Haroldine Laws said she is ready for ICD placement. thanks

## 2016-07-14 NOTE — Telephone Encounter (Signed)
I called and spoke with the patient. She is scheduled for her ICD implant on 08/03/16 with Dr. Caryl Comes. Patient confirmed date/ time. Detailed letter of instructions mailed to the patient.  She will come to the office on 3/5 for labs and to pick up her surgical scrub.

## 2016-07-16 ENCOUNTER — Ambulatory Visit: Payer: BLUE CROSS/BLUE SHIELD | Admitting: Family Medicine

## 2016-07-17 ENCOUNTER — Encounter: Payer: Self-pay | Admitting: Internal Medicine

## 2016-07-21 ENCOUNTER — Encounter: Payer: Self-pay | Admitting: Family

## 2016-07-21 MED ORDER — FLUTICASONE PROPIONATE 50 MCG/ACT NA SUSP: 2.0000 | Freq: Every day | NASAL | 2 refills | Status: DC | PRN

## 2016-07-27 ENCOUNTER — Other Ambulatory Visit: Payer: BLUE CROSS/BLUE SHIELD

## 2016-07-30 ENCOUNTER — Other Ambulatory Visit: Payer: BLUE CROSS/BLUE SHIELD | Admitting: *Deleted

## 2016-07-30 DIAGNOSIS — I428 Other cardiomyopathies: Secondary | ICD-10-CM

## 2016-07-30 DIAGNOSIS — I5022 Chronic systolic (congestive) heart failure: Secondary | ICD-10-CM

## 2016-07-30 DIAGNOSIS — Z01812 Encounter for preprocedural laboratory examination: Secondary | ICD-10-CM

## 2016-07-31 LAB — CBC WITH DIFFERENTIAL/PLATELET
Basophils Absolute: 0 10*3/uL (ref 0.0–0.2)
Basos: 0 %
EOS (ABSOLUTE): 0 10*3/uL (ref 0.0–0.4)
Eos: 0 %
Hematocrit: 39.8 % (ref 34.0–46.6)
Hemoglobin: 13.4 g/dL (ref 11.1–15.9)
Immature Grans (Abs): 0 10*3/uL (ref 0.0–0.1)
Immature Granulocytes: 0 %
Lymphocytes Absolute: 2.6 10*3/uL (ref 0.7–3.1)
Lymphs: 42 %
MCH: 29.2 pg (ref 26.6–33.0)
MCHC: 33.7 g/dL (ref 31.5–35.7)
MCV: 87 fL (ref 79–97)
Monocytes Absolute: 0.4 10*3/uL (ref 0.1–0.9)
Monocytes: 6 %
Neutrophils Absolute: 3.2 10*3/uL (ref 1.4–7.0)
Neutrophils: 52 %
Platelets: 231 10*3/uL (ref 150–379)
RBC: 4.59 x10E6/uL (ref 3.77–5.28)
RDW: 14.2 % (ref 12.3–15.4)
WBC: 6.3 10*3/uL (ref 3.4–10.8)

## 2016-07-31 LAB — BASIC METABOLIC PANEL
BUN/Creatinine Ratio: 14 (ref 9–23)
BUN: 13 mg/dL (ref 6–24)
CO2: 23 mmol/L (ref 18–29)
Calcium: 9.6 mg/dL (ref 8.7–10.2)
Chloride: 99 mmol/L (ref 96–106)
Creatinine, Ser: 0.95 mg/dL (ref 0.57–1.00)
GFR calc Af Amer: 85 mL/min/{1.73_m2} (ref >59)
GFR calc non Af Amer: 74 mL/min/{1.73_m2} (ref >59)
Glucose: 86 mg/dL (ref 65–99)
Potassium: 3.8 mmol/L (ref 3.5–5.2)
Sodium: 138 mmol/L (ref 134–144)

## 2016-07-31 LAB — PROTIME-INR
INR: 1 (ref 0.8–1.2)
Prothrombin Time: 10.8 s (ref 9.1–12.0)

## 2016-08-03 ENCOUNTER — Encounter (HOSPITAL_COMMUNITY)
Admission: RE | Disposition: A | Discharge status: Home / Self Care | Payer: Self-pay | Source: Ambulatory Visit | Attending: Internal Medicine

## 2016-08-03 ENCOUNTER — Ambulatory Visit (HOSPITAL_COMMUNITY)
Admission: RE | Admit: 2016-08-03 | Discharge: 2016-08-04 | Disposition: A | Discharge status: Home / Self Care | Payer: BLUE CROSS/BLUE SHIELD | Source: Ambulatory Visit | Attending: Internal Medicine | Admitting: Internal Medicine

## 2016-08-03 ENCOUNTER — Encounter (HOSPITAL_COMMUNITY): Payer: Self-pay | Admitting: General Practice

## 2016-08-03 DIAGNOSIS — Z7951 Long term (current) use of inhaled steroids: Secondary | ICD-10-CM | POA: Diagnosis not present

## 2016-08-03 DIAGNOSIS — Z9851 Tubal ligation status: Secondary | ICD-10-CM | POA: Insufficient documentation

## 2016-08-03 DIAGNOSIS — O903 Peripartum cardiomyopathy: Secondary | ICD-10-CM | POA: Diagnosis present

## 2016-08-03 DIAGNOSIS — Z6838 Body mass index (BMI) 38.0-38.9, adult: Secondary | ICD-10-CM | POA: Insufficient documentation

## 2016-08-03 DIAGNOSIS — Z79899 Other long term (current) drug therapy: Secondary | ICD-10-CM | POA: Insufficient documentation

## 2016-08-03 DIAGNOSIS — Z886 Allergy status to analgesic agent status: Secondary | ICD-10-CM | POA: Diagnosis not present

## 2016-08-03 DIAGNOSIS — I428 Other cardiomyopathies: Principal | ICD-10-CM

## 2016-08-03 DIAGNOSIS — I110 Hypertensive heart disease with heart failure: Secondary | ICD-10-CM | POA: Insufficient documentation

## 2016-08-03 DIAGNOSIS — Z01818 Encounter for other preprocedural examination: Secondary | ICD-10-CM | POA: Diagnosis not present

## 2016-08-03 DIAGNOSIS — E119 Type 2 diabetes mellitus without complications: Secondary | ICD-10-CM | POA: Insufficient documentation

## 2016-08-03 DIAGNOSIS — G4733 Obstructive sleep apnea (adult) (pediatric): Secondary | ICD-10-CM | POA: Diagnosis not present

## 2016-08-03 DIAGNOSIS — I493 Ventricular premature depolarization: Secondary | ICD-10-CM | POA: Insufficient documentation

## 2016-08-03 DIAGNOSIS — Z959 Presence of cardiac and vascular implant and graft, unspecified: Secondary | ICD-10-CM

## 2016-08-03 DIAGNOSIS — Z006 Encounter for examination for normal comparison and control in clinical research program: Secondary | ICD-10-CM | POA: Insufficient documentation

## 2016-08-03 DIAGNOSIS — Z91041 Radiographic dye allergy status: Secondary | ICD-10-CM | POA: Diagnosis not present

## 2016-08-03 DIAGNOSIS — Z8249 Family history of ischemic heart disease and other diseases of the circulatory system: Secondary | ICD-10-CM | POA: Diagnosis not present

## 2016-08-03 DIAGNOSIS — F419 Anxiety disorder, unspecified: Secondary | ICD-10-CM | POA: Diagnosis not present

## 2016-08-03 DIAGNOSIS — Z881 Allergy status to other antibiotic agents status: Secondary | ICD-10-CM | POA: Diagnosis not present

## 2016-08-03 DIAGNOSIS — I5022 Chronic systolic (congestive) heart failure: Secondary | ICD-10-CM | POA: Insufficient documentation

## 2016-08-03 HISTORY — DX: Presence of automatic (implantable) cardiac defibrillator: Z95.810

## 2016-08-03 HISTORY — DX: Unspecified convulsions: R56.9

## 2016-08-03 HISTORY — DX: Obstructive sleep apnea (adult) (pediatric): G47.33

## 2016-08-03 HISTORY — PX: CARDIAC DEFIBRILLATOR PLACEMENT: SHX171

## 2016-08-03 HISTORY — DX: Migraine, unspecified, not intractable, without status migrainosus: G43.909

## 2016-08-03 HISTORY — DX: Dependence on other enabling machines and devices: Z99.89

## 2016-08-03 HISTORY — PX: ICD IMPLANT: EP1208

## 2016-08-03 HISTORY — DX: Unspecified asthma, uncomplicated: J45.909

## 2016-08-03 HISTORY — DX: Other cardiomyopathies: I42.8

## 2016-08-03 LAB — GLUCOSE, CAPILLARY
Glucose-Capillary: 90 mg/dL (ref 65–99)
Glucose-Capillary: 96 mg/dL (ref 65–99)
Glucose-Capillary: 124 mg/dL — ABNORMAL HIGH (ref 65–99)

## 2016-08-03 LAB — SURGICAL PCR SCREEN
MRSA, PCR: NEGATIVE
Staphylococcus aureus: NEGATIVE

## 2016-08-03 SURGERY — ICD IMPLANT

## 2016-08-03 MED ORDER — DICYCLOMINE HCL 20 MG PO TABS
20.0000 mg | ORAL_TABLET | Freq: Two times a day (BID) | ORAL | Status: DC | PRN
  Filled 2016-08-03: qty 1

## 2016-08-03 MED ORDER — MUPIROCIN 2 % EX OINT
TOPICAL_OINTMENT | CUTANEOUS | Status: AC
  Administered 2016-08-03: 1 via TOPICAL
  Filled 2016-08-03: qty 22

## 2016-08-03 MED ORDER — FENTANYL CITRATE (PF) 100 MCG/2ML IJ SOLN
INTRAMUSCULAR | Status: DC | PRN
  Administered 2016-08-03: 50 ug via INTRAVENOUS
  Administered 2016-08-03: 25 ug via INTRAVENOUS
  Administered 2016-08-03: 50 ug via INTRAVENOUS

## 2016-08-03 MED ORDER — HEPARIN (PORCINE) IN NACL 2-0.9 UNIT/ML-% IJ SOLN
INTRAMUSCULAR | Status: AC
  Filled 2016-08-03: qty 500

## 2016-08-03 MED ORDER — MODAFINIL 100 MG PO TABS: 200.0000 mg | ORAL_TABLET | Freq: Every day | ORAL | Status: DC

## 2016-08-03 MED ORDER — CYANOCOBALAMIN 500 MCG PO TABS
500.0000 ug | ORAL_TABLET | Freq: Every day | ORAL | Status: DC
Start: 2016-08-04 — End: 2016-08-04
  Administered 2016-08-04: 10:00:00 500 ug via ORAL
  Filled 2016-08-03: qty 1

## 2016-08-03 MED ORDER — CEFAZOLIN IN D5W 1 GM/50ML IV SOLN
1.0000 g | Freq: Four times a day (QID) | INTRAVENOUS | Status: AC
  Administered 2016-08-03 – 2016-08-04 (×3): 1 g via INTRAVENOUS
  Filled 2016-08-03 (×4): qty 50

## 2016-08-03 MED ORDER — MUPIROCIN 2 % EX OINT
1.0000 "application " | TOPICAL_OINTMENT | Freq: Once | CUTANEOUS | Status: AC
  Administered 2016-08-03: 1 via TOPICAL
  Filled 2016-08-03: qty 22

## 2016-08-03 MED ORDER — SODIUM CHLORIDE 0.9 % IR SOLN
80.0000 mg | Status: AC
  Administered 2016-08-03: 80 mg

## 2016-08-03 MED ORDER — LIDOCAINE HCL (PF) 1 % IJ SOLN
INTRAMUSCULAR | Status: AC
  Filled 2016-08-03: qty 60

## 2016-08-03 MED ORDER — CEFAZOLIN SODIUM-DEXTROSE 2-4 GM/100ML-% IV SOLN
INTRAVENOUS | Status: AC
  Filled 2016-08-03: qty 100

## 2016-08-03 MED ORDER — SPIRONOLACTONE 25 MG PO TABS
25.0000 mg | ORAL_TABLET | Freq: Every day | ORAL | Status: DC
  Administered 2016-08-04: 10:00:00 25 mg via ORAL
  Filled 2016-08-03: qty 1

## 2016-08-03 MED ORDER — ONDANSETRON HCL 4 MG/2ML IJ SOLN: 4.0000 mg | Freq: Four times a day (QID) | INTRAMUSCULAR | Status: DC | PRN

## 2016-08-03 MED ORDER — TRAMADOL HCL 50 MG PO TABS
50.0000 mg | ORAL_TABLET | Freq: Four times a day (QID) | ORAL | Status: DC | PRN
  Administered 2016-08-03 – 2016-08-04 (×3): 50 mg via ORAL
  Filled 2016-08-03 (×3): qty 1

## 2016-08-03 MED ORDER — IVABRADINE HCL 7.5 MG PO TABS
7.5000 mg | ORAL_TABLET | Freq: Two times a day (BID) | ORAL | Status: DC
  Administered 2016-08-03 – 2016-08-04 (×2): 7.5 mg via ORAL
  Filled 2016-08-03 (×2): qty 1

## 2016-08-03 MED ORDER — SODIUM CHLORIDE 0.9 % IV SOLN
INTRAVENOUS | Status: DC | PRN
  Administered 2016-08-03: 250 mL via INTRAVENOUS

## 2016-08-03 MED ORDER — SACUBITRIL-VALSARTAN 97-103 MG PO TABS
1.0000 | ORAL_TABLET | Freq: Two times a day (BID) | ORAL | Status: DC
  Administered 2016-08-03 – 2016-08-04 (×2): 1 via ORAL
  Filled 2016-08-03 (×2): qty 1

## 2016-08-03 MED ORDER — CHLORHEXIDINE GLUCONATE 4 % EX LIQD: 60.0000 mL | Freq: Once | CUTANEOUS | Status: DC

## 2016-08-03 MED ORDER — ENSURE ENLIVE PO LIQD
237.0000 mL | Freq: Two times a day (BID) | ORAL | Status: DC
  Administered 2016-08-03 – 2016-08-04 (×2): 237 mL via ORAL
  Filled 2016-08-03 (×4): qty 237

## 2016-08-03 MED ORDER — ARMODAFINIL 250 MG PO TABS: 250.0000 mg | ORAL_TABLET | Freq: Every day | ORAL | Status: DC

## 2016-08-03 MED ORDER — FENTANYL CITRATE (PF) 100 MCG/2ML IJ SOLN
INTRAMUSCULAR | Status: AC
Start: 2016-08-03 — End: 2016-08-03
  Filled 2016-08-03: qty 2

## 2016-08-03 MED ORDER — ONDANSETRON 4 MG PO TBDP
4.0000 mg | ORAL_TABLET | Freq: Three times a day (TID) | ORAL | Status: DC | PRN
  Filled 2016-08-03: qty 1

## 2016-08-03 MED ORDER — MOMETASONE FURO-FORMOTEROL FUM 200-5 MCG/ACT IN AERO
2.0000 | INHALATION_SPRAY | Freq: Two times a day (BID) | RESPIRATORY_TRACT | Status: DC
  Administered 2016-08-03 – 2016-08-04 (×2): 2 via RESPIRATORY_TRACT
  Filled 2016-08-03: qty 8.8

## 2016-08-03 MED ORDER — MIDAZOLAM HCL 5 MG/5ML IJ SOLN
INTRAMUSCULAR | Status: AC
  Filled 2016-08-03: qty 5

## 2016-08-03 MED ORDER — LIDOCAINE HCL (PF) 1 % IJ SOLN
INTRAMUSCULAR | Status: DC | PRN
  Administered 2016-08-03: 45 mL via INTRADERMAL

## 2016-08-03 MED ORDER — CARVEDILOL 12.5 MG PO TABS
25.0000 mg | ORAL_TABLET | Freq: Two times a day (BID) | ORAL | Status: DC
  Administered 2016-08-03 – 2016-08-04 (×2): 25 mg via ORAL
  Filled 2016-08-03 (×2): qty 2

## 2016-08-03 MED ORDER — LIRAGLUTIDE 18 MG/3ML ~~LOC~~ SOPN: 1.8000 mg | PEN_INJECTOR | Freq: Every day | SUBCUTANEOUS | Status: DC

## 2016-08-03 MED ORDER — POTASSIUM CHLORIDE ER 10 MEQ PO TBCR
10.0000 meq | EXTENDED_RELEASE_TABLET | Freq: Every day | ORAL | Status: DC
  Administered 2016-08-04: 10 meq via ORAL
  Filled 2016-08-03 (×2): qty 1

## 2016-08-03 MED ORDER — SODIUM CHLORIDE 0.9 % IV SOLN: INTRAVENOUS | Status: AC

## 2016-08-03 MED ORDER — COLESTIPOL HCL 1 G PO TABS
2.0000 g | ORAL_TABLET | Freq: Every day | ORAL | Status: DC
  Administered 2016-08-04: 10:00:00 2 g via ORAL
  Filled 2016-08-03: qty 2

## 2016-08-03 MED ORDER — MIDAZOLAM HCL 5 MG/5ML IJ SOLN
INTRAMUSCULAR | Status: DC | PRN
  Administered 2016-08-03: 2 mg via INTRAVENOUS
  Administered 2016-08-03: 1 mg via INTRAVENOUS
  Administered 2016-08-03: 2 mg via INTRAVENOUS
  Administered 2016-08-03 (×2): 1 mg via INTRAVENOUS

## 2016-08-03 MED ORDER — YOU HAVE A PACEMAKER BOOK
Freq: Once | Status: AC
Start: 2016-08-03 — End: 2016-08-03
  Administered 2016-08-03: 22:00:00
  Filled 2016-08-03: qty 1

## 2016-08-03 MED ORDER — HEPARIN (PORCINE) IN NACL 2-0.9 UNIT/ML-% IJ SOLN
INTRAMUSCULAR | Status: DC | PRN
  Administered 2016-08-03: 1000 mL

## 2016-08-03 MED ORDER — SODIUM CHLORIDE 0.9 % IV SOLN
INTRAVENOUS | Status: DC
  Administered 2016-08-03: 10:00:00 via INTRAVENOUS

## 2016-08-03 MED ORDER — FENTANYL CITRATE (PF) 100 MCG/2ML IJ SOLN
INTRAMUSCULAR | Status: AC
  Filled 2016-08-03: qty 2

## 2016-08-03 MED ORDER — GABAPENTIN 100 MG PO CAPS: 100.0000 mg | ORAL_CAPSULE | Freq: Every day | ORAL | Status: DC | PRN

## 2016-08-03 MED ORDER — ALBUTEROL SULFATE (2.5 MG/3ML) 0.083% IN NEBU: 3.0000 mL | INHALATION_SOLUTION | Freq: Four times a day (QID) | RESPIRATORY_TRACT | Status: DC | PRN

## 2016-08-03 MED ORDER — FUROSEMIDE 80 MG PO TABS
80.0000 mg | ORAL_TABLET | Freq: Two times a day (BID) | ORAL | Status: DC
  Administered 2016-08-04: 10:00:00 80 mg via ORAL
  Filled 2016-08-03 (×2): qty 1

## 2016-08-03 MED ORDER — ACETAMINOPHEN 325 MG PO TABS
325.0000 mg | ORAL_TABLET | ORAL | Status: DC | PRN
  Administered 2016-08-03 – 2016-08-04 (×3): 650 mg via ORAL
  Filled 2016-08-03 (×3): qty 2

## 2016-08-03 MED ORDER — CYCLOBENZAPRINE HCL 10 MG PO TABS
10.0000 mg | ORAL_TABLET | Freq: Three times a day (TID) | ORAL | Status: DC | PRN
Start: 2016-08-03 — End: 2016-08-04
  Administered 2016-08-03: 10 mg via ORAL
  Filled 2016-08-03: qty 1

## 2016-08-03 MED ORDER — FLUTICASONE PROPIONATE 50 MCG/ACT NA SUSP: 2.0000 | Freq: Every day | NASAL | Status: DC | PRN

## 2016-08-03 MED ORDER — SODIUM CHLORIDE 0.9 % IR SOLN
Status: AC
  Filled 2016-08-03: qty 2

## 2016-08-03 MED ORDER — CEFAZOLIN SODIUM-DEXTROSE 2-4 GM/100ML-% IV SOLN
2.0000 g | INTRAVENOUS | Status: AC
  Administered 2016-08-03: 2 g via INTRAVENOUS

## 2016-08-03 MED ORDER — VENLAFAXINE HCL ER 75 MG PO CP24
225.0000 mg | ORAL_CAPSULE | Freq: Every day | ORAL | Status: DC
  Administered 2016-08-04: 10:00:00 225 mg via ORAL
  Filled 2016-08-03: qty 1

## 2016-08-03 SURGICAL SUPPLY — 6 items
CABLE SURGICAL S-101-97-12 (CABLE) ×2 IMPLANT
ICD VIGILANT VR D232 (Pacemaker) ×2 IMPLANT
LEAD RELIANCE G DF4 0293 (Lead) ×2 IMPLANT
PAD DEFIB LIFELINK (PAD) ×2 IMPLANT
SHEATH CLASSIC 9.5F (SHEATH) ×2 IMPLANT
TRAY PACEMAKER INSERTION (PACKS) ×2 IMPLANT

## 2016-08-03 NOTE — Progress Notes (Signed)
Pt wears CPAP at home and unsure of settings. Pt was set on auto titrate BIPAP with 2L bled in per Dr. Caryl Comes.

## 2016-08-03 NOTE — H&P (Signed)
Patient Care Team: Golden Circle, FNP as PCP - General (Family Medicine) Tanda Rockers, MD (Pulmonary Disease) Chucky May, MD (Psychiatry) Lelon Perla, MD (Cardiology) Rigoberto Noel, MD (Pulmonary Disease)   HPI  [name] is a 41 y.o. female admitted for ICD implantation for primary preventio  She has a nonischemic cardiomyopathy thought to be postpartum dating back about 20 years.  She's been managed with Entresto, carvedilol, varying and spironolactone as well as diuretics  She has been noted to have increasing fatigue over recent months with DOE  She is obstructive sleep apnea and wears CPAP. She is morbidly obese.   DATE TEST    9/15 Echo EF 35-40%   6/17    Echo   EF 25-30 %   6/17    Cath   EF 25 %    Normal CA  12/17 Echo EF 20-25%    ECG demonstrated normal QRS 12/17   Seen 1/18 and ECG noted to have freq PVCs  Holter showed only about 2%     Past Medical History:  Diagnosis Date  . Anxiety   . Arthritis    right shoulder   . CHF (congestive heart failure) ([place])   . Depression   . Diabetes mellitus without complication ([place])   . Dyspnea    comes and goes intermittently mostly with exertion   . Fibroid    age 38  . Gallstones   . Hypertension   . IBS (irritable bowel syndrome)   . OSA (obstructive sleep apnea)    on CPAP   . Ovarian cyst    1999; surgically removed  . 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 Termination of pregnancy    due to cardiac risk    Past Surgical History:  Procedure Laterality Date  . ABDOMINAL SURGERY     several  . CARDIAC CATHETERIZATION N/A 11/11/2015   Procedure: Right/Left Heart Cath and Coronary Angiography;  Surgeon: Troy Sine, MD;  Location: [facility];  Service: Cardiovascular;  Laterality: N/A;  . CESAREAN SECTION    . CHOLECYSTECTOMY    . COLONOSCOPY WITH PROPOFOL N/A 04/21/2016   Procedure: COLONOSCOPY WITH PROPOFOL;  Surgeon: Jerene Bears, MD;  Location: [facility];  Service: Gastroenterology;  Laterality: N/A;  . ESOPHAGOGASTRODUODENOSCOPY (EGD) WITH PROPOFOL N/A 04/21/2016   Procedure: ESOPHAGOGASTRODUODENOSCOPY (EGD) WITH PROPOFOL;  Surgeon: Jerene Bears, MD;  Location: [facility];  Service: Gastroenterology;  Laterality: N/A;  . FOOT SURGERY Right   . LEFT HEART CATHETERIZATION WITH CORONARY ANGIOGRAM N/A 02/26/2014   Procedure: LEFT HEART CATHETERIZATION WITH CORONARY ANGIOGRAM;  Surgeon: Jettie Booze, MD;  Location: [facility];  Service: Cardiovascular;  Laterality: N/A;  . TUBAL LIGATION  1999    Current Facility-Administered Medications  Medication Dose Route Frequency Provider Last Rate Last Dose  . 0.9 %  sodium chloride infusion   Intravenous Continuous Deboraha Sprang, MD 50 mL/hr at 08/03/16 1003    . 0.9 %  sodium chloride infusion   Intravenous Continuous Deboraha Sprang, MD 50 mL/hr at 08/03/16 1003    . ceFAZolin (ANCEF) IVPB 2g/100 mL premix  2 g Intravenous On Call Deboraha Sprang, MD      . chlorhexidine (HIBICLENS) 4 % liquid 4 application  60 mL Topical Once Deboraha Sprang, MD      . gentamicin (GARAMYCIN) 80 mg in sodium chloride irrigation 0.9 % 500 mL  irrigation  80 mg Irrigation On Call Deboraha Sprang, MD        Allergies  Allergen Reactions  . Vancomycin Other (See Comments)    "did something to my kidneys"; went into kidney failure  . Contrast Media [Iodinated Diagnostic Agents] Other (See Comments)    Multiple CT contrast studies done over 2 weeks causing ARF  . Aspirin Other (See Comments)    Wheezing; but patient still takes if she needs to  . Ciprofloxacin Itching and Rash  . Sulfa Antibiotics Itching and Rash      Social History  Substance Use Topics  . Smoking status: Never Smoker  . Smokeless tobacco: Never Used  . Alcohol use No     Family History  Problem Relation Age of Onset  . Emphysema Maternal Grandmother     smoked  . Heart disease Maternal Grandmother  62    MI  . Rheum arthritis Mother   . Allergies Daughter   . Colon cancer Neg Hx      No current facility-administered medications on file prior to encounter.    Current Outpatient Prescriptions on File Prior to Encounter  Medication Sig Dispense Refill  . albuterol (PROVENTIL HFA;VENTOLIN HFA) 108 (90 Base) MCG/ACT inhaler Inhale 2 puffs into the lungs every 6 (six) hours as needed for wheezing or shortness of breath. 1 Inhaler 0  . Armodafinil (NUVIGIL) 250 MG tablet Take 250 mg by mouth daily.    . budesonide-formoterol (SYMBICORT) 160-4.5 MCG/ACT inhaler Inhale 2 puffs into the lungs 2 (two) times daily. (Patient taking differently: Inhale 2 puffs into the lungs 2 (two) times daily as needed (for shortness of breath). ) 1 Inhaler 3  . carvedilol (COREG) 25 MG tablet Take 1 tablet (25 mg total) by mouth 2 (two) times daily with a meal. 180 tablet 3  . colestipol (COLESTID) 1 g tablet Take 2 tablets (2 g total) by mouth daily. 60 tablet 3  . dicyclomine (BENTYL) 20 MG tablet Take 1 tablet (20 mg total) by mouth 2 (two) times daily with a meal. (Patient taking differently: Take 20 mg by mouth 2 (two) times daily as needed for spasms. ) 60 tablet 2  . furosemide (LASIX) 80 MG tablet Take 80 mg by mouth 2 (two) times daily.    Marland Kitchen gabapentin (NEURONTIN) 100 MG capsule Take 100 mg by mouth daily as needed (for pain).     . ivabradine (CORLANOR) 5 MG TABS tablet Take 1.5 tablets (7.5 mg total) by mouth 2 (two) times daily with a meal. 90 tablet 3  . potassium chloride (K-DUR) 10 MEQ tablet Take 1 tablet (10 mEq total) by mouth daily. 90 tablet 3  . sacubitril-valsartan (ENTRESTO) 97-103 MG Take 1 tablet by mouth 2 (two) times daily. 60 tablet 11  . spironolactone (ALDACTONE) 25 MG tablet Take 1 tablet (25 mg total) by mouth daily. 90 tablet 3  . UNABLE TO FIND CPAP    . venlafaxine XR (EFFEXOR-XR) 75 MG 24 hr capsule Take 225 mg by mouth daily with breakfast.    . VICTOZA 18 MG/3ML SOPN Inject  0.3 mLs (1.8 mg total) into the skin daily. 27 mL 0  . vitamin B-12 (CYANOCOBALAMIN) 500 MCG tablet Take 500 mcg by mouth daily.    . Vitamin D, Ergocalciferol, (DRISDOL) 50000 units CAPS capsule TAKE ONE CAPSULE BY MOUTH BY MOUTH ONCE A WEEK 4 capsule 1  . cyclobenzaprine (FLEXERIL) 10 MG tablet Take 1 tablet (10 mg total) by mouth 3 (  three) times daily as needed for muscle spasms. 30 tablet 0  . Diclofenac Sodium 2 % SOLN Apply 1 pump twice daily. (Patient taking differently: Apply 1 application topically as needed (for pain). ) 112 g 3  . ondansetron (ZOFRAN ODT) 4 MG disintegrating tablet Take 1 tablet (4 mg total) by mouth every 8 (eight) hours as needed for nausea or vomiting. 20 tablet 0      Review of Systems negative except from HPI and PMH  Physical Exam BP (!) 112/39   Pulse 82   Temp 98.3 F (36.8 C) (Oral)   Resp 18   Ht 5\' 5"  (1.651 m)   Wt 225 lb (102.1 kg)   SpO2 95%   BMI 37.44 kg/m  Well developed and well nourished in no acute distress HENT normal E scleral and icterus clear Neck Supple JVP flat; carotids brisk and full Clear to ausculation  Regular rate and rhythm, no murmurs gallops or rub Soft with active bowel sounds No clubbing cyanosis  Edema Alert and oriented, grossly normal motor and sensory function Skin Warm and Dry    Assessment and  Plan  PVCs  NICM   CHF systolic   Sleep apnea/CPAP    Pt appropriately considered for ICD for primary prevention; will use CPAP   Have reviewed the potential benefits and risks of ICD implantation including but not limited to death, perforation of heart or lung, lead dislodgement, infection,  device malfunction and inappropriate shocks.  The patient and family express understanding  and are willing to proceed.

## 2016-08-03 NOTE — Interval H&P Note (Signed)
ICD Criteria  Current LVEF:25%. Within 12 months prior to implant: Yes   Heart failure history: Yes, Class III  Cardiomyopathy history: Yes, Non-Ischemic Cardiomyopathy.  Atrial Fibrillation/Atrial Flutter: No.  Ventricular tachycardia history: No.  Cardiac arrest history: No.  History of syndromes with risk of sudden death: No.  Previous ICD: No.  Current ICD indication: Primary  PPM indication: No.   Class I or II Bradycardia indication present: No  Beta Blocker therapy for 3 or more months: Yes, prescribed.   Ace Inhibitor/ARB therapy for 3 or more months: Yes, prescribed.   History and Physical Interval Note:  08/03/2016 12:23 PM  [name]  has presented today for surgery, with the diagnosis of cm hf  The various methods of treatment have been discussed with the patient and family. After consideration of risks, benefits and other options for treatment, the patient has consented to  Procedure(s): ICD Implant (N/A) as a surgical intervention .  The patient's history has been reviewed, patient examined, no change in status, stable for surgery.  I have reviewed the patient's chart and labs.  Questions were answered to the patient's satisfaction.     Virl Axe

## 2016-08-03 NOTE — Care Management Note (Signed)
Case Management Note  Patient Details  Name: [name] MRN: [id number] Date of Birth: Oct 20, 1975  Subjective/Objective:    S/p ICD implant, NCM will cont to follow for dc needs.                Action/Plan:   Expected Discharge Date:                  Expected Discharge Plan:  Home/Self Care  In-House Referral:     Discharge planning Services  CM Consult  Post Acute Care Choice:    Choice offered to:     DME Arranged:    DME Agency:     HH Arranged:    HH Agency:     Status of Service:  In process, will continue to follow  If discussed at Long Length of Stay Meetings, dates discussed:    Additional Comments:  Zenon Mayo, RN 08/03/2016, 6:05 PM

## 2016-08-04 ENCOUNTER — Ambulatory Visit (HOSPITAL_COMMUNITY): Payer: BLUE CROSS/BLUE SHIELD

## 2016-08-04 ENCOUNTER — Encounter (HOSPITAL_COMMUNITY): Payer: Self-pay | Admitting: Internal Medicine

## 2016-08-04 DIAGNOSIS — Z006 Encounter for examination for normal comparison and control in clinical research program: Secondary | ICD-10-CM | POA: Diagnosis not present

## 2016-08-04 DIAGNOSIS — I428 Other cardiomyopathies: Secondary | ICD-10-CM | POA: Diagnosis not present

## 2016-08-04 DIAGNOSIS — I5022 Chronic systolic (congestive) heart failure: Secondary | ICD-10-CM | POA: Diagnosis not present

## 2016-08-04 LAB — GLUCOSE, CAPILLARY: Glucose-Capillary: 108 mg/dL — ABNORMAL HIGH (ref 65–99)

## 2016-08-04 NOTE — Discharge Summary (Signed)
ELECTROPHYSIOLOGY PROCEDURE DISCHARGE SUMMARY    Patient ID: [name],  MRN: [id number], DOB/AGE: 06/25/1975 41 y.o.  Admit date: 08/03/2016 Discharge date: 08/04/2016  Primary Care Physician: Mauricio Po, [place] Primary Cardiologist: Tamala Julian Electrophysiologist: Caryl Comes  Primary Discharge Diagnosis:  NICM s/p ICD implant this admission  Secondary Discharge Diagnosis:  1.  Morbid obesity 2.  OSA 3.  Diabetes 4.  Anxiety/depression  Allergies  Allergen Reactions  . Vancomycin Other (See Comments)    "did something to my kidneys"; went into kidney failure  . Contrast Media [Iodinated Diagnostic Agents] Other (See Comments)    Multiple CT contrast studies done over 2 weeks causing ARF  . Aspirin Other (See Comments)    Wheezing; but patient still takes if she needs to  . Ciprofloxacin Itching and Rash  . Sulfa Antibiotics Itching and Rash    Procedures This Admission:  1.  Implantation of a BSX single chamber ICD on 08/03/16 by Dr Caryl Comes. See op note for full details. There were no immediate post procedure complications. 2.  CXR on 08/04/16 demonstrated no pneumothorax status post device implantation.   Brief HPI: [name] is a 41 y.o. female was referred to electrophysiology in the outpatient setting for consideration of ICD implantation.  Past medical history includes peri-partum cardiomyopathy, chronic systolic heart failure.  The patient has persistent LV dysfunction despite guideline directed therapy.  Risks, benefits, and alternatives to ICD implantation were reviewed with the patient who wished to proceed.   Hospital Course:  The patient was admitted and underwent implantation of a BSX single chamber ICD with details as outlined above. She was monitored on telemetry overnight which demonstrated sinus rhythm with few PVC's.  Left chest was without hematoma or ecchymosis.  The device was interrogated and found to be functioning normally.  CXR was obtained and  demonstrated no pneumothorax status post device implantation.  Wound care, arm mobility, and restrictions were reviewed with the patient.  The patient was examined and considered stable for discharge to home.   The patient's discharge medications include an ACE-I (Valosartan) and beta blocker (Coreg).   Physical Exam: Vitals:   08/03/16 2011 08/04/16 0440 08/04/16 0544 08/04/16 0700  BP: (!) 150/80 (!) 117/52  (!) 95/56  Pulse: 72   68  Resp: 19 (!) 22  14  Temp: 98 F (36.7 C) 97.8 F (36.6 C)  97.4 F (36.3 C)  TempSrc: Oral Oral  Oral  SpO2: 98% 99%  98%  Weight:   231 lb 11.3 oz (105.1 kg)   Height:        GEN- The patient is well appearing, alert and oriented x 3 today.   HEENT: normocephalic, atraumatic; sclera clear, conjunctiva pink; hearing intact; oropharynx clear; neck supple Lungs- Clear to ausculation bilaterally, normal work of breathing.  No wheezes, rales, rhonchi Heart- Regular rate and rhythm GI- soft, non-tender, non-distended, bowel sounds present Extremities- no clubbing, cyanosis, or edema MS- no significant deformity or atrophy Skin- warm and dry, no rash or lesion, left chest without hematoma/ecchymosis Psych- euthymic mood, full affect Neuro- strength and sensation are intact   Labs:   Lab Results  Component Value Date   WBC 6.3 07/30/2016   HGB 13.6 06/24/2016   HCT 39.8 07/30/2016   MCV 87 07/30/2016   PLT 231 07/30/2016     Recent Labs Lab 07/30/16 1544  NA 138  K 3.8  CL 99  CO2 23  BUN 13  CREATININE 0.95  CALCIUM  9.6  GLUCOSE 86    Discharge Medications:  Allergies as of 08/04/2016      Reactions   Vancomycin Other (See Comments)   "did something to my kidneys"; went into kidney failure   Contrast Media [iodinated Diagnostic Agents] Other (See Comments)   Multiple CT contrast studies done over 2 weeks causing ARF   Aspirin Other (See Comments)   Wheezing; but patient still takes if she needs to   Ciprofloxacin Itching,  Rash   Sulfa Antibiotics Itching, Rash      Medication List    TAKE these medications   albuterol 108 (90 Base) MCG/ACT inhaler Commonly known as:  PROVENTIL HFA;VENTOLIN HFA Inhale 2 puffs into the lungs every 6 (six) hours as needed for wheezing or shortness of breath.   budesonide-formoterol 160-4.5 MCG/ACT inhaler Commonly known as:  SYMBICORT Inhale 2 puffs into the lungs 2 (two) times daily. What changed:  when to take this  reasons to take this   carvedilol 25 MG tablet Commonly known as:  COREG Take 1 tablet (25 mg total) by mouth 2 (two) times daily with a meal.   colestipol 1 g tablet Commonly known as:  COLESTID Take 2 tablets (2 g total) by mouth daily.   cyclobenzaprine 10 MG tablet Commonly known as:  FLEXERIL Take 1 tablet (10 mg total) by mouth 3 (three) times daily as needed for muscle spasms.   Diclofenac Sodium 2 % Soln Apply 1 pump twice daily. What changed:  how much to take  how to take this  when to take this  reasons to take this  additional instructions   dicyclomine 20 MG tablet Commonly known as:  BENTYL Take 1 tablet (20 mg total) by mouth 2 (two) times daily with a meal. What changed:  when to take this  reasons to take this   fluticasone 50 MCG/ACT nasal spray Commonly known as:  FLONASE Place 2 sprays into both nostrils daily as needed for allergies.   furosemide 80 MG tablet Commonly known as:  LASIX Take 80 mg by mouth 2 (two) times daily.   gabapentin 100 MG capsule Commonly known as:  NEURONTIN Take 100 mg by mouth daily as needed (for pain).   ivabradine 5 MG Tabs tablet Commonly known as:  CORLANOR Take 1.5 tablets (7.5 mg total) by mouth 2 (two) times daily with a meal.   NUVIGIL 250 MG tablet Generic drug:  Armodafinil Take 250 mg by mouth daily.   ondansetron 4 MG disintegrating tablet Commonly known as:  ZOFRAN ODT Take 1 tablet (4 mg total) by mouth every 8 (eight) hours as needed for nausea or  vomiting.   potassium chloride 10 MEQ tablet Commonly known as:  K-DUR Take 1 tablet (10 mEq total) by mouth daily.   sacubitril-valsartan 97-103 MG Commonly known as:  ENTRESTO Take 1 tablet by mouth 2 (two) times daily.   spironolactone 25 MG tablet Commonly known as:  ALDACTONE Take 1 tablet (25 mg total) by mouth daily.   UNABLE TO FIND CPAP   venlafaxine XR 75 MG 24 hr capsule Commonly known as:  EFFEXOR-XR Take 225 mg by mouth daily with breakfast.   VICTOZA 18 MG/3ML Sopn Generic drug:  liraglutide Inject 0.3 mLs (1.8 mg total) into the skin daily.   vitamin B-12 500 MCG tablet Commonly known as:  CYANOCOBALAMIN Take 500 mcg by mouth daily.   Vitamin D (Ergocalciferol) 50000 units Caps capsule Commonly known as:  DRISDOL TAKE ONE CAPSULE BY MOUTH  BY MOUTH ONCE A WEEK       Disposition:  Discharge Instructions    Diet - low sodium heart healthy    Complete by:  As directed    Increase activity slowly    Complete by:  As directed      Follow-up Information    [facility] Follow up on 08/13/2016.   Specialty:  Cardiology Why:  at 4:30PM Contact information: [address] [place] [address] [phone]          Duration of Discharge Encounter: Greater than 30 minutes including physician time.  Signed, Chanetta Marshall, NP 08/04/2016 8:39 AM  Patient seen and examined. Instructions given

## 2016-08-04 NOTE — Discharge Instructions (Signed)
° ° °  Supplemental Discharge Instructions for  Pacemaker/Defibrillator Patients  Activity No heavy lifting or vigorous activity with your left/right arm for 6 to 8 weeks.  Do not raise your left/right arm above your head for one week.  Gradually raise your affected arm as drawn below.           __       08/08/16                   08/09/16                   08/10/16                    08/11/16  NO DRIVING for   1 week  ; you may begin driving on   0/30/09  .  WOUND CARE - Keep the wound area clean and dry  - The tape/steri-strips on your wound will fall off; do not pull them off.  No bandage is needed on the site.  DO  NOT apply any creams, oils, or ointments to the wound area. - If you notice any drainage or discharge from the wound, any swelling or bruising at the site, or you develop a fever > 101? F after you are discharged home, call the office at once.  Special Instructions - You are still able to use cellular telephones; use the ear opposite the side where you have your pacemaker/defibrillator.  Avoid carrying your cellular phone near your device. - When traveling through airports, show security personnel your identification card to avoid being screened in the metal detectors.  Ask the security personnel to use the hand wand. - Avoid arc welding equipment, MRI testing (magnetic resonance imaging), TENS units (transcutaneous nerve stimulators).  Call the office for questions about other devices. - Avoid electrical appliances that are in poor condition or are not properly grounded. - Microwave ovens are safe to be near or to operate.  Additional information for defibrillator patients should your device go off: - If your device goes off ONCE and you feel fine afterward, notify the device clinic nurses. - If your device goes off ONCE and you do not feel well afterward, call 911. - If your device goes off TWICE, call 911. - If your device goes off THREE times in one day, call 911.  DO NOT  DRIVE YOURSELF OR A FAMILY MEMBER WITH A DEFIBRILLATOR TO THE HOSPITAL--CALL 911.

## 2016-08-04 NOTE — Care Management Note (Signed)
Case Management Note  Patient Details  Name: MARILEA GWYNNE MRN: [id number] Date of Birth: Apr 25, 1976  Subjective/Objective:    S/p ICD implant, for dc today, no needs.                Action/Plan:   Expected Discharge Date:  08/04/16               Expected Discharge Plan:  Home/Self Care  In-House Referral:     Discharge planning Services  CM Consult  Post Acute Care Choice:    Choice offered to:     DME Arranged:    DME Agency:     HH Arranged:    HH Agency:     Status of Service:  Completed, signed off  If discussed at [employer] of Stay Meetings, dates discussed:    Additional Comments:  Zenon Mayo, RN 08/04/2016, 2:42 PM

## 2016-08-13 ENCOUNTER — Ambulatory Visit (INDEPENDENT_AMBULATORY_CARE_PROVIDER_SITE_OTHER): Payer: BLUE CROSS/BLUE SHIELD | Admitting: *Deleted

## 2016-08-13 DIAGNOSIS — I428 Other cardiomyopathies: Secondary | ICD-10-CM

## 2016-08-13 LAB — CUP PACEART INCLINIC DEVICE CHECK
Brady Statistic RV Percent Paced: 0 %
Date Time Interrogation Session: 20180322040000
HighPow Impedance: 47 Ohm
Implantable Lead Implant Date: 20180312
Implantable Lead Location: 753860
Implantable Lead Model: 293
Implantable Lead Serial Number: 422842
Implantable Pulse Generator Implant Date: 20180312
Lead Channel Impedance Value: 437 Ohm
Lead Channel Pacing Threshold Amplitude: 0.7 V
Lead Channel Pacing Threshold Pulse Width: 0.4 ms
Lead Channel Sensing Intrinsic Amplitude: 11.5 mV
Lead Channel Setting Pacing Amplitude: 3.5 V
Lead Channel Setting Pacing Pulse Width: 0.4 ms
Lead Channel Setting Sensing Sensitivity: 0.5 mV
Pulse Gen Serial Number: 226601

## 2016-08-13 NOTE — Progress Notes (Signed)
Wound check appointment. Steri-strips removed. Wound without redness or edema. Incision edges approximated, wound well healed. Normal device function. Threshold, sensing, and impedances consistent with implant measurements. Device programmed at 3.5V for extra safety margin until 3 month visit. Histogram distribution appropriate for patient and level of activity. No mode switches or ventricular arrhythmias noted. Patient educated about wound care, arm mobility, lifting restrictions, shock plan. ROV with SK 11/20/16.

## 2016-08-14 ENCOUNTER — Telehealth (HOSPITAL_COMMUNITY): Payer: Self-pay | Admitting: Family

## 2016-08-14 NOTE — Telephone Encounter (Signed)
Verified [employer] insurance benefits through [employer]. No Copay, Coinsurance 30%, Deductible $500.00 pt has met. Out of Pocket $6500.00, pt has met $5027.74, pt's responsibility $331.66. Reference [phone].... KJ   Lft msg for pt confirming Orientation schedule and to return our call to go over benefits also mailed out Orientation packet... KJ

## 2016-08-19 ENCOUNTER — Other Ambulatory Visit: Payer: Self-pay | Admitting: Family

## 2016-08-19 ENCOUNTER — Encounter: Payer: Self-pay | Admitting: Family

## 2016-08-19 MED ORDER — CYCLOBENZAPRINE HCL 10 MG PO TABS: 10.0000 mg | ORAL_TABLET | Freq: Three times a day (TID) | ORAL | 0 refills | Status: DC | PRN

## 2016-08-19 MED ORDER — VICTOZA 18 MG/3ML ~~LOC~~ SOPN: 1.8000 mg | PEN_INJECTOR | Freq: Every day | SUBCUTANEOUS | 0 refills | Status: DC

## 2016-08-19 MED ORDER — ALBUTEROL SULFATE HFA 108 (90 BASE) MCG/ACT IN AERS: 2.0000 | INHALATION_SPRAY | Freq: Four times a day (QID) | RESPIRATORY_TRACT | 0 refills | Status: DC | PRN

## 2016-08-20 ENCOUNTER — Encounter: Payer: Self-pay | Admitting: Internal Medicine

## 2016-08-20 ENCOUNTER — Ambulatory Visit (INDEPENDENT_AMBULATORY_CARE_PROVIDER_SITE_OTHER): Payer: BLUE CROSS/BLUE SHIELD | Admitting: Nurse Practitioner

## 2016-08-20 ENCOUNTER — Encounter: Payer: Self-pay | Admitting: Nurse Practitioner

## 2016-08-20 VITALS — BP 120/82 | HR 53 | Temp 97.9°F | Ht 65.0 in | Wt 231.0 lb

## 2016-08-20 DIAGNOSIS — G4700 Insomnia, unspecified: Secondary | ICD-10-CM | POA: Diagnosis not present

## 2016-08-20 MED ORDER — TRAZODONE HCL 50 MG PO TABS: 50.0000 mg | ORAL_TABLET | Freq: Every evening | ORAL | 0 refills | Status: DC | PRN

## 2016-08-20 NOTE — Progress Notes (Signed)
Subjective:  Patient ID: [name], female    DOB: [birth year]  Age: 41 y.o. MRN: [id number]  CC: Follow-up (sleep for a little and then woke up---then can not go back to sleep. going on for 2 and half week. )   Insomnia  Primary symptoms: fragmented sleep, sleep disturbance, no difficulty falling asleep, no somnolence, frequent awakening, premature morning awakening, no malaise/fatigue, no napping.   The current episode started more than one year. The onset quality is undetermined. The problem occurs nightly. The problem has been gradually worsening since onset. The symptoms are aggravated by anxiety and other stimulant use. How many drinks containing alcohol do you have on a typical day when you are drinking: none.  Nothing relieves the symptoms. Past treatments include medication and meditation (OTC medication, sonata, ambien, valium). The treatment provided mild relief. Typical bedtime:  8-10 P.M..  How long after going to bed to you fall asleep: less than 15 minutes.   PMH includes: hypertension, depression, apnea.  previous medication discontinued de to cardiac function and sleep apnea. Current se of CPAP nightly  Outpatient Medications Prior to Visit  Medication Sig Dispense Refill  . albuterol (PROVENTIL HFA;VENTOLIN HFA) 108 (90 Base) MCG/ACT inhaler Inhale 2 puffs into the lungs every 6 (six) hours as needed for wheezing or shortness of breath. 1 Inhaler 0  . Armodafinil (NUVIGIL) 250 MG tablet Take 250 mg by mouth daily.    . budesonide-formoterol (SYMBICORT) 160-4.5 MCG/ACT inhaler Inhale 2 puffs into the lungs 2 (two) times daily. (Patient taking differently: Inhale 2 puffs into the lungs 2 (two) times daily as needed (for shortness of breath). ) 1 Inhaler 3  . carvedilol (COREG) 25 MG tablet Take 1 tablet (25 mg total) by mouth 2 (two) times daily with a meal. 180 tablet 3  . colestipol (COLESTID) 1 g tablet Take 2 tablets (2 g total) by mouth daily. 60 tablet 3  .  cyclobenzaprine (FLEXERIL) 10 MG tablet Take 1 tablet (10 mg total) by mouth 3 (three) times daily as needed for muscle spasms. 30 tablet 0  . Diclofenac Sodium 2 % SOLN Apply 1 pump twice daily. (Patient taking differently: Apply 1 application topically as needed (for pain). ) 112 g 3  . dicyclomine (BENTYL) 20 MG tablet Take 1 tablet (20 mg total) by mouth 2 (two) times daily with a meal. (Patient taking differently: Take 20 mg by mouth 2 (two) times daily as needed for spasms. ) 60 tablet 2  . fluticasone (FLONASE) 50 MCG/ACT nasal spray Place 2 sprays into both nostrils daily as needed for allergies. 16 g 2  . furosemide (LASIX) 80 MG tablet Take 80 mg by mouth 2 (two) times daily.    Marland Kitchen gabapentin (NEURONTIN) 100 MG capsule Take 100 mg by mouth daily as needed (for pain).     . ivabradine (CORLANOR) 5 MG TABS tablet Take 1.5 tablets (7.5 mg total) by mouth 2 (two) times daily with a meal. 90 tablet 3  . ondansetron (ZOFRAN ODT) 4 MG disintegrating tablet Take 1 tablet (4 mg total) by mouth every 8 (eight) hours as needed for nausea or vomiting. 20 tablet 0  . potassium chloride (K-DUR) 10 MEQ tablet Take 1 tablet (10 mEq total) by mouth daily. 90 tablet 3  . sacubitril-valsartan (ENTRESTO) 97-103 MG Take 1 tablet by mouth 2 (two) times daily. 60 tablet 11  . spironolactone (ALDACTONE) 25 MG tablet Take 1 tablet (25 mg total) by mouth daily. 90 tablet 3  .  UNABLE TO FIND CPAP    . venlafaxine XR (EFFEXOR-XR) 75 MG 24 hr capsule Take 225 mg by mouth daily with breakfast.    . VICTOZA 18 MG/3ML SOPN Inject 0.3 mLs (1.8 mg total) into the skin daily. 27 mL 0  . vitamin B-12 (CYANOCOBALAMIN) 500 MCG tablet Take 500 mcg by mouth daily.    . Vitamin D, Ergocalciferol, (DRISDOL) 50000 units CAPS capsule TAKE ONE CAPSULE BY MOUTH BY MOUTH ONCE A WEEK 4 capsule 1   No facility-administered medications prior to visit.     ROS See HPI  Objective:  BP 120/82   Pulse (!) 53   Temp 97.9 F (36.6 C)    Ht 5\' 5"  (1.651 m)   Wt 231 lb (104.8 kg)   LMP 08/04/2016   SpO2 100%   BMI 38.44 kg/m   BP Readings from Last 3 Encounters:  08/20/16 120/82  08/04/16 (!) 95/56  07/10/16 114/78    Wt Readings from Last 3 Encounters:  08/20/16 231 lb (104.8 kg)  08/04/16 231 lb 11.3 oz (105.1 kg)  07/10/16 233 lb (105.7 kg)    Physical Exam  Constitutional: She is oriented to person, place, and time. No distress.  Neck: Normal range of motion. Neck supple. No JVD present.  Cardiovascular: Normal rate, regular rhythm and normal heart sounds.   Pulmonary/Chest: Effort normal and breath sounds normal.  Musculoskeletal: She exhibits no edema.  Neurological: She is alert and oriented to person, place, and time.  Skin: Skin is warm and dry.  Vitals reviewed.   Lab Results  Component Value Date   WBC 6.3 07/30/2016   HGB 13.6 06/24/2016   HCT 39.8 07/30/2016   PLT 231 07/30/2016   GLUCOSE 86 07/30/2016   CHOL 160 11/09/2015   TRIG 70 11/09/2015   HDL 58 11/09/2015   LDLCALC 88 11/09/2015   ALT 8 05/28/2016   AST 10 05/28/2016   NA 138 07/30/2016   K 3.8 07/30/2016   CL 99 07/30/2016   CREATININE 0.95 07/30/2016   BUN 13 07/30/2016   CO2 23 07/30/2016   TSH 1.09 05/28/2016   INR 1.0 07/30/2016   HGBA1C 5.3 05/28/2016    Dg Chest 2 View  Result Date: 08/04/2016 CLINICAL DATA:  Postop pacemaker EXAM: CHEST  2 VIEW COMPARISON:  06/24/2016 FINDINGS: Interval placement of AICD with single lead tip in the right ventricle. No pneumothorax. Lungs are clear. Heart is normal size. No effusions. No acute bony abnormality. IMPRESSION: AICD placement without pneumothorax.  No acute findings. Electronically Signed   By: Rolm Baptise M.D.   On: 08/04/2016 07:27    Assessment & Plan:   [name] was seen today for follow-up.  Diagnoses and all orders for this visit:  Insomnia, unspecified type -     traZODone (DESYREL) 50 MG tablet; Take 1 tablet (50 mg total) by mouth at bedtime as needed  for sleep.   I am having [name] start on traZODone. I am also having her maintain her gabapentin, venlafaxine XR, NUVIGIL, spironolactone, Diclofenac Sodium, carvedilol, furosemide, sacubitril-valsartan, Vitamin D (Ergocalciferol), colestipol, dicyclomine, budesonide-formoterol, UNABLE TO FIND, ondansetron, potassium chloride, vitamin B-12, ivabradine, fluticasone, albuterol, VICTOZA, and cyclobenzaprine.  Meds ordered this encounter  Medications  . traZODone (DESYREL) 50 MG tablet    Sig: Take 1 tablet (50 mg total) by mouth at bedtime as needed for sleep.    Dispense:  30 tablet    Refill:  0    Order Specific Question:  Supervising Provider    Answer:   Cassandria Anger [[id number]]    Follow-up: Return in about 1 month (around 09/20/2016) for insomnia with Terri Piedra.Wilfred Lacy, NP

## 2016-08-20 NOTE — Patient Instructions (Addendum)
Discuss with psychiatrist about possibly decreasing dose of Provigil and/or effexor.  Insomnia Insomnia is a sleep disorder that makes it difficult to fall asleep or to stay asleep. Insomnia can cause tiredness (fatigue), low energy, difficulty concentrating, mood swings, and poor performance at work or school. There are three different ways to classify insomnia:  Difficulty falling asleep.  Difficulty staying asleep.  Waking up too early in the morning. Any type of insomnia can be long-term (chronic) or short-term (acute). Both are common. Short-term insomnia usually lasts for three months or less. Chronic insomnia occurs at least three times a week for longer than three months. What are the causes? Insomnia may be caused by another condition, situation, or substance, such as:  Anxiety.  Certain medicines.  Gastroesophageal reflux disease (GERD) or other gastrointestinal conditions.  Asthma or other breathing conditions.  Restless legs syndrome, sleep apnea, or other sleep disorders.  Chronic pain.  Menopause. This may include hot flashes.  Stroke.  Abuse of alcohol, tobacco, or illegal drugs.  Depression.  Caffeine.  Neurological disorders, such as Alzheimer disease.  An overactive thyroid (hyperthyroidism). The cause of insomnia may not be known. What increases the risk? Risk factors for insomnia include:  Gender. Women are more commonly affected than men.  Age. Insomnia is more common as you get older.  Stress. This may involve your professional or personal life.  Income. Insomnia is more common in people with lower income.  Lack of exercise.  Irregular work schedule or night shifts.  Traveling between different time zones. What are the signs or symptoms? If you have insomnia, trouble falling asleep or trouble staying asleep is the main symptom. This may lead to other symptoms, such as:  Feeling fatigued.  Feeling nervous about going to sleep.  Not  feeling rested in the morning.  Having trouble concentrating.  Feeling irritable, anxious, or depressed. How is this treated? Treatment for insomnia depends on the cause. If your insomnia is caused by an underlying condition, treatment will focus on addressing the condition. Treatment may also include:  Medicines to help you sleep.  Counseling or therapy.  Lifestyle adjustments. Follow these instructions at home:  Take medicines only as directed by your health care provider.  Keep regular sleeping and waking hours. Avoid naps.  Keep a sleep diary to help you and your health care provider figure out what could be causing your insomnia. Include:  When you sleep.  When you wake up during the night.  How well you sleep.  How rested you feel the next day.  Any side effects of medicines you are taking.  What you eat and drink.  Make your bedroom a comfortable place where it is easy to fall asleep:  Put up shades or special blackout curtains to block light from outside.  Use a white noise machine to block noise.  Keep the temperature cool.  Exercise regularly as directed by your health care provider. Avoid exercising right before bedtime.  Use relaxation techniques to manage stress. Ask your health care provider to suggest some techniques that may work well for you. These may include:  Breathing exercises.  Routines to release muscle tension.  Visualizing peaceful scenes.  Cut back on alcohol, caffeinated beverages, and cigarettes, especially close to bedtime. These can disrupt your sleep.  Do not overeat or eat spicy foods right before bedtime. This can lead to digestive discomfort that can make it hard for you to sleep.  Limit screen use before bedtime. This includes:  Watching  TV.  Using your smartphone, tablet, and computer.  Stick to a routine. This can help you fall asleep faster. Try to do a quiet activity, brush your teeth, and go to bed at the same time  each night.  Get out of bed if you are still awake after 15 minutes of trying to sleep. Keep the lights down, but try reading or doing a quiet activity. When you feel sleepy, go back to bed.  Make sure that you drive carefully. Avoid driving if you feel very sleepy.  Keep all follow-up appointments as directed by your health care provider. This is important. Contact a health care provider if:  You are tired throughout the day or have trouble in your daily routine due to sleepiness.  You continue to have sleep problems or your sleep problems get worse. Get help right away if:  You have serious thoughts about hurting yourself or someone else. This information is not intended to replace advice given to you by your health care provider. Make sure you discuss any questions you have with your health care provider. Document Released: 05/08/2000 Document Revised: 10/11/2015 Document Reviewed: 02/09/2014 Elsevier Interactive Patient Education  2017 [employer].

## 2016-08-20 NOTE — Progress Notes (Signed)
Pre visit review using our clinic review tool, if applicable. No additional management support is needed unless otherwise documented below in the visit note. 

## 2016-08-24 ENCOUNTER — Other Ambulatory Visit: Payer: Self-pay | Admitting: Internal Medicine

## 2016-08-26 ENCOUNTER — Encounter: Payer: Self-pay | Admitting: Family

## 2016-08-26 NOTE — Telephone Encounter (Signed)
Let me know if you need help witting for this DME.

## 2016-09-01 ENCOUNTER — Telehealth: Payer: Self-pay | Admitting: Internal Medicine

## 2016-09-01 NOTE — Telephone Encounter (Signed)
Spoke to patient about sx's. Advised her to send a remote transmission. Will call her back once it's received. Patient voiced understanding.

## 2016-09-01 NOTE — Telephone Encounter (Signed)
New message      Pt had a defibrillator put in on march 12th.  She c/o fatigue, palpitations and the area where the device is located is painful to the touch.  Please advise

## 2016-09-01 NOTE — Telephone Encounter (Signed)
Transmission received. Presenting rhythm: Regular Vs. No episodes recorded. Stable lead measurements.  Called patient back to inform her of the transmission results. Patient states that she feels fine today, but wanted to know if it showed anything from the last few days. I explained to her that the transmission has not shown arrhythmias. She said that she had gotten a reading on her watch that her HR was 133bpm, which caused her some anxiety. She said that she woke up the next day and felt very fatigued afterwards. I explained to patient that I checked her histograms and it doesn't look like she has any sustained HR's in the 130s. I encouraged her to take a manual reading of her HR, especially since she has a h/o PVCs. I instructed her on how to do this. Patient verbalized understanding.  Patient also c/o pain at the device site when she raises her arm. She said that it's not a constant pain. She said that it's something that she notices when doing activities. I explained to her that the device is right overtop of the muscle and it may take some time for the pain to fully go away. I instructed her to call back if the site ever becomes swollen, red, hot, or irritated. I also told her to call back if the pain becomes constant.   Patient verbalized understanding and appreciation of all information.

## 2016-09-02 ENCOUNTER — Telehealth: Payer: Self-pay | Admitting: Internal Medicine

## 2016-09-02 ENCOUNTER — Ambulatory Visit (INDEPENDENT_AMBULATORY_CARE_PROVIDER_SITE_OTHER)
Admission: RE | Admit: 2016-09-02 | Discharge: 2016-09-02 | Disposition: A | Discharge status: Home / Self Care | Payer: BLUE CROSS/BLUE SHIELD | Source: Ambulatory Visit | Attending: Internal Medicine | Admitting: Internal Medicine

## 2016-09-02 ENCOUNTER — Encounter: Payer: Self-pay | Admitting: Internal Medicine

## 2016-09-02 ENCOUNTER — Ambulatory Visit (INDEPENDENT_AMBULATORY_CARE_PROVIDER_SITE_OTHER): Payer: BLUE CROSS/BLUE SHIELD | Admitting: Internal Medicine

## 2016-09-02 VITALS — BP 110/60 | HR 47 | Temp 98.4°F | Ht 65.0 in | Wt 234.0 lb

## 2016-09-02 DIAGNOSIS — S99921A Unspecified injury of right foot, initial encounter: Secondary | ICD-10-CM | POA: Diagnosis not present

## 2016-09-02 NOTE — Telephone Encounter (Signed)
Patient believes she broke her right foot yesterday on the right outer side of foot. States she hit a wall yesterday. Put patient in at 3:15 with jones.  [facility] patient.  Patient has a 9 1/2 pain level.  Patient states she can walk on it but it hurts really bad when she does.  States she does not have any swelling.  States foot is only a little red.

## 2016-09-02 NOTE — Progress Notes (Signed)
Pre visit review using our clinic review tool, if applicable. No additional management support is needed unless otherwise documented below in the visit note. 

## 2016-09-02 NOTE — Telephone Encounter (Signed)
Will defer to Dr. Ronnald Ramp as she is scheduled to see him.

## 2016-09-02 NOTE — Patient Instructions (Signed)
Foot Sprain A foot sprain is an injury to one of the strong bands of tissue (ligaments) that connect and support the many bones in your feet. The ligament can be stretched too much or it can tear. A tear can be either partial or complete. The severity of the sprain depends on how much of the ligament was damaged or torn. What are the causes? A foot sprain is usually caused by suddenly twisting or pivoting your foot. What increases the risk? This injury is more likely to occur in people who:  Play a sport, such as basketball or football.  Exercise or play a sport without warming up.  Start a new workout or sport.  Suddenly increase how long or hard they exercise or play a sport. What are the signs or symptoms? Symptoms of this condition start soon after an injury and include:  Pain, especially in the arch of the foot.  Bruising.  Swelling.  Inability to walk or use the foot to support body weight. How is this diagnosed? This condition is diagnosed with a medical history and physical exam. You may also have imaging tests, such as:  X-rays to make sure there are no broken bones (fractures).  MRI to see if the ligament has torn. How is this treated? Treatment varies depending on the severity of your sprain. Mild sprains can be treated with rest, ice, compression, and elevation (RICE). If your ligament is overstretched or partially torn, treatment usually involves keeping your foot in a fixed position (immobilization) for a period of time. To help you do this, your health care provider will apply a bandage, splint, or walking boot to keep your foot from moving until it heals. You may also be advised to use crutches or a scooter for a few weeks to avoid bearing weight on your foot while it is healing. If your ligament is fully torn, you may need surgery to reconnect the ligament to the bone. After surgery, a cast or splint will be applied and will need to stay on your foot while it  heals. Your health care provider may also suggest exercises or physical therapy to strengthen your foot. Follow these instructions at home: If You Have a Bandage, Splint, or Walking Boot:   Wear it as directed by your health care provider. Remove it only as directed by your health care provider.  Loosen the bandage, splint, or walking boot if your toes become numb and tingle, or if they turn cold and blue. Bathing   If your health care provider approves bathing and showering, cover the bandage or splint with a watertight plastic bag to protect it from water. Do not let the bandage or splint get wet. Managing pain, stiffness, and swelling   If directed, apply ice to the injured area:  Put ice in a plastic bag.  Place a towel between your skin and the bag.  Leave the ice on for 20 minutes, 2-3 times per day.  Move your toes often to avoid stiffness and to lessen swelling.  Raise (elevate) the injured area above the level of your heart while you are sitting or lying down. Driving   Do not drive or operate heavy machinery while taking pain medicine.  Ask your health care provider when it is safe to drive if you have a bandage, splint, or walking boot on your foot. Activity   Rest as directed by your health care provider.  Do not use the injured foot to support your body weight until   your health care provider says that you can. Use crutches or other supportive devices as directed by your health care provider.  Ask your health care provider what activities are safe for you. Gradually increase how much and how far you walk until your health care provider says it is safe to return to full activity.  Do any exercise or physical therapy as directed by your health care provider. General instructions   If a splint was applied, do not put pressure on any part of it until it is fully hardened. This may take several hours.  Take medicines only as directed by your health care provider.  These include over-the-counter medicines and prescription medicines.  Keep all follow-up visits as directed by your health care provider. This is important.  When you can walk without pain, wear supportive shoes that have stiff soles. Do not wear flip-flops, and do not walk barefoot. Contact a health care provider if:  Your pain is not controlled with medicine.  Your bruising or swelling gets worse or does not get better with treatment.  Your splint or walking boot is damaged. Get help right away if:  You develop severe numbness or tingling in your foot.  Your foot turns blue, white, or gray, and it feels cold. This information is not intended to replace advice given to you by your health care provider. Make sure you discuss any questions you have with your health care provider. Document Released: 10/31/2001 Document Revised: 10/17/2015 Document Reviewed: 03/14/2014 Elsevier Interactive Patient Education  2017 [employer]  

## 2016-09-03 NOTE — Progress Notes (Signed)
Subjective:  Patient ID: [name], female    DOB: 1975/07/12  Age: 41 y.o. MRN: [id number]  CC: Foot Injury   HPI [name] presents for Concerns about her right foot, she stubbed it against a wall one day prior to this visit and has pain and swelling over the fourth and fifth toes. She is getting symptom relief with Tylenol as needed.  Outpatient Medications Prior to Visit  Medication Sig Dispense Refill  . albuterol (PROVENTIL HFA;VENTOLIN HFA) 108 (90 Base) MCG/ACT inhaler Inhale 2 puffs into the lungs every 6 (six) hours as needed for wheezing or shortness of breath. 1 Inhaler 0  . Armodafinil (NUVIGIL) 250 MG tablet Take 250 mg by mouth daily.    . budesonide-formoterol (SYMBICORT) 160-4.5 MCG/ACT inhaler Inhale 2 puffs into the lungs 2 (two) times daily. (Patient taking differently: Inhale 2 puffs into the lungs 2 (two) times daily as needed (for shortness of breath). ) 1 Inhaler 3  . carvedilol (COREG) 25 MG tablet Take 1 tablet (25 mg total) by mouth 2 (two) times daily with a meal. 180 tablet 3  . colestipol (COLESTID) 1 g tablet Take 2 tablets (2 g total) by mouth daily. 60 tablet 3  . cyclobenzaprine (FLEXERIL) 10 MG tablet Take 1 tablet (10 mg total) by mouth 3 (three) times daily as needed for muscle spasms. 30 tablet 0  . Diclofenac Sodium 2 % SOLN Apply 1 pump twice daily. (Patient taking differently: Apply 1 application topically as needed (for pain). ) 112 g 3  . dicyclomine (BENTYL) 20 MG tablet Take 1 tablet (20 mg total) by mouth 2 (two) times daily with a meal. (Patient taking differently: Take 20 mg by mouth 2 (two) times daily as needed for spasms. ) 60 tablet 2  . fluticasone (FLONASE) 50 MCG/ACT nasal spray Place 2 sprays into both nostrils daily as needed for allergies. 16 g 2  . furosemide (LASIX) 80 MG tablet Take 80 mg by mouth 2 (two) times daily.    Marland Kitchen gabapentin (NEURONTIN) 100 MG capsule Take 100 mg by mouth daily as needed (for pain).     .  ivabradine (CORLANOR) 5 MG TABS tablet Take 1.5 tablets (7.5 mg total) by mouth 2 (two) times daily with a meal. 90 tablet 3  . ondansetron (ZOFRAN ODT) 4 MG disintegrating tablet Take 1 tablet (4 mg total) by mouth every 8 (eight) hours as needed for nausea or vomiting. 20 tablet 0  . sacubitril-valsartan (ENTRESTO) 97-103 MG Take 1 tablet by mouth 2 (two) times daily. 60 tablet 11  . spironolactone (ALDACTONE) 25 MG tablet Take 1 tablet (25 mg total) by mouth daily. 90 tablet 3  . traZODone (DESYREL) 50 MG tablet Take 1 tablet (50 mg total) by mouth at bedtime as needed for sleep. 30 tablet 0  . UNABLE TO FIND CPAP    . venlafaxine XR (EFFEXOR-XR) 75 MG 24 hr capsule Take 225 mg by mouth daily with breakfast.    . VICTOZA 18 MG/3ML SOPN Inject 0.3 mLs (1.8 mg total) into the skin daily. 27 mL 0  . vitamin B-12 (CYANOCOBALAMIN) 500 MCG tablet Take 500 mcg by mouth daily.    . Vitamin D, Ergocalciferol, (DRISDOL) 50000 units CAPS capsule TAKE ONE CAPSULE BY MOUTH BY MOUTH ONCE A WEEK 4 capsule 1  . potassium chloride (K-DUR) 10 MEQ tablet Take 1 tablet (10 mEq total) by mouth daily. 90 tablet 3   No facility-administered medications prior to visit.  ROS Review of Systems  Musculoskeletal: Positive for arthralgias.  All other systems reviewed and are negative.   Objective:  BP 110/60 (BP Location: Left Arm, Patient Position: Sitting, Cuff Size: Large)   Pulse (!) 47   Temp 98.4 F (36.9 C) (Oral)   Ht 5\' 5"  (1.651 m)   Wt 234 lb (106.1 kg)   LMP 08/04/2016   SpO2 99%   BMI 38.94 kg/m   BP Readings from Last 3 Encounters:  09/02/16 110/60  08/20/16 120/82  08/04/16 (!) 95/56    Wt Readings from Last 3 Encounters:  09/02/16 234 lb (106.1 kg)  08/20/16 231 lb (104.8 kg)  08/04/16 231 lb 11.3 oz (105.1 kg)    Physical Exam  Musculoskeletal:       Right foot: There is tenderness, bony tenderness and swelling. There is normal range of motion, normal capillary refill, no  crepitus, no deformity and no laceration.       Feet:    Lab Results  Component Value Date   WBC 6.3 07/30/2016   HGB 13.6 06/24/2016   HCT 39.8 07/30/2016   PLT 231 07/30/2016   GLUCOSE 86 07/30/2016   CHOL 160 11/09/2015   TRIG 70 11/09/2015   HDL 58 11/09/2015   LDLCALC 88 11/09/2015   ALT 8 05/28/2016   AST 10 05/28/2016   NA 138 07/30/2016   K 3.8 07/30/2016   CL 99 07/30/2016   CREATININE 0.95 07/30/2016   BUN 13 07/30/2016   CO2 23 07/30/2016   TSH 1.09 05/28/2016   INR 1.0 07/30/2016   HGBA1C 5.3 05/28/2016    Dg Foot Complete Right  Result Date: 09/02/2016 CLINICAL DATA:  Injury to lateral aspect of foot EXAM: RIGHT FOOT COMPLETE - 3+ VIEW COMPARISON:  January 05, 2011 FINDINGS: Frontal, oblique, and lateral views were obtained. There is mild soft tissue swelling lateral to the fifth MTP joint. There is no appreciable acute fracture or dislocation. There is mild spurring in the dorsal midfoot. There is a small inferior calcaneal spur. No erosive change. IMPRESSION: Soft tissue swelling laterally. No evident fracture or dislocation. No appreciable joint space narrowing. Mild spurring in dorsal midfoot. Small inferior calcaneal spur. Electronically Signed   By: Lowella Grip III M.D.   On: 09/02/2016 15:11    Assessment & Plan:   Jaidan was seen today for foot injury.  Diagnoses and all orders for this visit:  Right foot injury, initial encounter- exam is remarkable for tenderness palpation and swelling but the plain film is negative for fracture, she will rest/ice/elevate and will continue acetaminophen as needed -     DG Foot Complete Right; Future   I am having Ms. Blackerby maintain her gabapentin, venlafaxine XR, NUVIGIL, spironolactone, Diclofenac Sodium, carvedilol, furosemide, sacubitril-valsartan, Vitamin D (Ergocalciferol), colestipol, dicyclomine, budesonide-formoterol, UNABLE TO FIND, ondansetron, potassium chloride, vitamin B-12, ivabradine, fluticasone,  albuterol, VICTOZA, cyclobenzaprine, and traZODone.  No orders of the defined types were placed in this encounter.    Follow-up: Return if symptoms worsen or fail to improve.  [name], MD

## 2016-09-09 ENCOUNTER — Telehealth (HOSPITAL_COMMUNITY): Payer: Self-pay | Admitting: Family

## 2016-09-09 ENCOUNTER — Telehealth (HOSPITAL_COMMUNITY): Payer: Self-pay | Admitting: Cardiac Rehabilitation

## 2016-09-09 NOTE — Telephone Encounter (Signed)
-----   Message from Megan Berthold, NP sent at 09/09/2016  3:10 PM EDT ----- Regarding: RE: cardiac rehab  Ok to start rehab.  Should not lift >20 pounds with left arm until 4 weeks post implant, other than that, no restrictions  Museum/gallery conservator  ----- Message ----- From: Lowell Guitar, RN Sent: 09/09/2016   2:45 PM To: Rowe Pavy, RN, Megan Berthold, NP Subject: cardiac rehab                                  Dear Luetta Nutting,  Pt is scheduled to begin cardiac rehab 09/10/16.  She is s/p ICD implant 08/03/16.  Is it ok for her to start rehab?   Are there any activity restrictions?    She has been evaluated in the device clinic post op however is not scheduled to see Dr. Caryl Comes until 11/20/2016.    Thank you, Andi Hence, RN, BSN Cardiac Pulmonary Rehab

## 2016-09-09 NOTE — Telephone Encounter (Signed)
Updated insurance verification through [employer] Pt has met $6260.66 out of $6650.00 Out of [employer] [phone].... KJ

## 2016-09-10 ENCOUNTER — Encounter (HOSPITAL_COMMUNITY)
Admission: RE | Admit: 2016-09-10 | Discharge: 2016-09-10 | Disposition: A | Discharge status: Home / Self Care | Payer: BLUE CROSS/BLUE SHIELD | Source: Ambulatory Visit | Attending: Internal Medicine | Admitting: Internal Medicine

## 2016-09-10 VITALS — BP 136/86 | HR 97 | Ht 65.5 in | Wt 242.7 lb

## 2016-09-10 DIAGNOSIS — I5042 Chronic combined systolic (congestive) and diastolic (congestive) heart failure: Secondary | ICD-10-CM | POA: Diagnosis not present

## 2016-09-10 DIAGNOSIS — I428 Other cardiomyopathies: Secondary | ICD-10-CM

## 2016-09-10 NOTE — Progress Notes (Signed)
Cardiac Rehab Medication Review by a Pharmacist  Does the patient  feel that his/her medications are working for him/her?  Yes   Has the patient been experiencing any side effects to the medications prescribed?  Yes - lightheadedness  Does the patient measure his/her own blood pressure or blood glucose at home?  Sometimes - checks when chest tightness or heart racing - lays down when lightheadedness    Does the patient have any problems obtaining medications due to transportation or finances?   no  Understanding of regimen: good Understanding of indications: good Potential of compliance: good    Pharmacist comments: [name] is a pleasant 41 yo female presenting to cardiac rehab today without assistance. She reports lightheadedness with ivabradine dose increase but reports that her HR has not been low. Educated on orthostatic hypotension and importance of changing positions slowly. She voiced frustration with having to take multiple medications and how Lasix impacts her life. Would recommend counseling/support for these frustrations. She voiced understanding of the importance of her medications, and has a system to help her stay compliant. No other questions or concerns at this time.    Belia Heman, PharmD PGY1 Resident 09/10/2016 7:58 AM

## 2016-09-10 NOTE — Progress Notes (Signed)
Cardiac Individual Treatment Plan  Patient Details  Name: [name] MRN: [id number] Date of Birth: 1975-09-12 Referring Provider:     CARDIAC REHAB PHASE II ORIENTATION from 09/10/2016 in [place]  Referring Provider  Glori Bickers MD      Initial Encounter Date:    CARDIAC REHAB PHASE II ORIENTATION from 09/10/2016 in [facility]  Date  09/10/16  Referring Provider  Glori Bickers MD      Visit Diagnosis: NICM (nonischemic cardiomyopathy) ([place])  [name] Home Medications on Admission:  Current Outpatient Prescriptions:  .  albuterol (PROVENTIL HFA;VENTOLIN HFA) 108 (90 Base) MCG/ACT inhaler, Inhale 2 puffs into the lungs every 6 (six) hours as needed for wheezing or shortness of breath., Disp: 1 Inhaler, Rfl: 0 .  Armodafinil (NUVIGIL) 250 MG tablet, Take 250 mg by mouth daily., Disp: , Rfl:  .  budesonide-formoterol (SYMBICORT) 160-4.5 MCG/ACT inhaler, Inhale 2 puffs into the lungs 2 (two) times daily. (Patient taking differently: Inhale 2 puffs into the lungs 2 (two) times daily as needed (for shortness of breath). ), Disp: 1 Inhaler, Rfl: 3 .  carvedilol (COREG) 25 MG tablet, Take 1 tablet (25 mg total) by mouth 2 (two) times daily with a meal., Disp: 180 tablet, Rfl: 3 .  colestipol (COLESTID) 1 g tablet, Take 2 tablets (2 g total) by mouth daily., Disp: 60 tablet, Rfl: 3 .  cyclobenzaprine (FLEXERIL) 10 MG tablet, Take 1 tablet (10 mg total) by mouth 3 (three) times daily as needed for muscle spasms., Disp: 30 tablet, Rfl: 0 .  Diclofenac Sodium 2 % SOLN, Apply 1 pump twice daily. (Patient taking differently: Apply 1 application topically as needed (for pain). ), Disp: 112 g, Rfl: 3 .  dicyclomine (BENTYL) 20 MG tablet, Take 1 tablet (20 mg total) by mouth 2 (two) times daily with a meal. (Patient taking differently: Take 20 mg by mouth 2 (two) times daily as needed for spasms. ), Disp: 60 tablet, Rfl: 2 .   fluticasone (FLONASE) 50 MCG/ACT nasal spray, Place 2 sprays into both nostrils daily as needed for allergies., Disp: 16 g, Rfl: 2 .  furosemide (LASIX) 80 MG tablet, Take 80 mg by mouth 2 (two) times daily., Disp: , Rfl:  .  gabapentin (NEURONTIN) 100 MG capsule, Take 100 mg by mouth daily as needed (for pain). , Disp: , Rfl:  .  ivabradine (CORLANOR) 5 MG TABS tablet, Take 1.5 tablets (7.5 mg total) by mouth 2 (two) times daily with a meal., Disp: 90 tablet, Rfl: 3 .  ondansetron (ZOFRAN ODT) 4 MG disintegrating tablet, Take 1 tablet (4 mg total) by mouth every 8 (eight) hours as needed for nausea or vomiting., Disp: 20 tablet, Rfl: 0 .  sacubitril-valsartan (ENTRESTO) 97-103 MG, Take 1 tablet by mouth 2 (two) times daily., Disp: 60 tablet, Rfl: 11 .  spironolactone (ALDACTONE) 25 MG tablet, Take 1 tablet (25 mg total) by mouth daily., Disp: 90 tablet, Rfl: 3 .  traZODone (DESYREL) 50 MG tablet, Take 1 tablet (50 mg total) by mouth at bedtime as needed for sleep., Disp: 30 tablet, Rfl: 0 .  UNABLE TO FIND, CPAP, Disp: , Rfl:  .  venlafaxine XR (EFFEXOR-XR) 75 MG 24 hr capsule, Take 225 mg by mouth daily with breakfast., Disp: , Rfl:  .  VICTOZA 18 MG/3ML SOPN, Inject 0.3 mLs (1.8 mg total) into the skin daily., Disp: 27 mL, Rfl: 0 .  vitamin B-12 (CYANOCOBALAMIN) 500 MCG  tablet, Take 500 mcg by mouth daily., Disp: , Rfl:  .  Vitamin D, Ergocalciferol, (DRISDOL) 50000 units CAPS capsule, TAKE ONE CAPSULE BY MOUTH BY MOUTH ONCE A WEEK, Disp: 4 capsule, Rfl: 1 .  potassium chloride (K-DUR) 10 MEQ tablet, Take 1 tablet (10 mEq total) by mouth daily., Disp: 90 tablet, Rfl: 3  Past Medical History: Past Medical History:  Diagnosis Date  . AICD (automatic cardioverter/defibrillator) present   . Anxiety   . Arthritis    right shoulder   . Asthma   . CHF (congestive heart failure) ([place])   . Depression   . Diabetes mellitus without complication ([place])   . Dyspnea    comes and goes intermittently  mostly with exertion   . Fibroid    age 48  . Gallstones   . Hypertension   . IBS (irritable bowel syndrome)   . Migraine    "monthly" (08/03/2016)  . OSA on CPAP   . Ovarian cyst    1999; surgically removed  . 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 Seizures ([place])    "as a child" (08/03/2016)  . Termination of pregnancy    due to cardiac risk    Tobacco Use: History  Smoking Status  . Never Smoker  Smokeless Tobacco  . Never Used    Labs: Recent Review Flowsheet Data    Labs for ITP Cardiac and Pulmonary Rehab Latest Ref Rng & Units 10/15/2014 11/09/2015 11/11/2015 11/11/2015 05/28/2016   Cholestrol 0 - 200 mg/dL 145 160 - - -   LDLCALC 0 - 99 mg/dL 85 88 - - -   HDL >40 mg/dL 46.50 58 - - -   Trlycerides <150 mg/dL 70.0 70 - - -   Hemoglobin A1c 4.6 - 6.5 % - 5.4 - - 5.3   PHART 7.350 - 7.450 - - - 7.372 -   PCO2ART 35.0 - 45.0 mmHg - - - 43.0 -   HCO3 20.0 - 24.0 mEq/L - - 26.7(H) 25.0(H) -   TCO2 0 - 100 mmol/L - - 28 26 -   O2SAT % - - 62.0 90.0 -      Capillary Blood Glucose: Lab Results  Component Value Date   GLUCAP 108 (H) 08/04/2016   GLUCAP 124 (H) 08/03/2016   GLUCAP 96 08/03/2016   GLUCAP 90 08/03/2016   GLUCAP 87 04/21/2016     Exercise Target Goals: Date: 09/10/16  Exercise Program Goal: Individual exercise prescription set with THRR, safety & activity barriers. Participant demonstrates ability to understand and report RPE using BORG scale, to self-measure pulse accurately, and to acknowledge the importance of the exercise prescription.  Exercise Prescription Goal: Starting with aerobic activity 30 plus minutes a day, 3 days per week for initial exercise prescription. Provide home exercise prescription and guidelines that participant acknowledges understanding prior to discharge.  Activity Barriers & Risk Stratification:     Activity Barriers & Cardiac Risk Stratification - 09/10/16 1134      Activity Barriers & Cardiac  Risk Stratification   Activity Barriers Deconditioning;Muscular Weakness;Other (comment);Incisional Pain   Comments R knee pain and R shoulder bursitis   Cardiac Risk Stratification High      6 Minute Walk:     6 Minute Walk    Row Name 09/10/16 1134 09/10/16 1138       6 Minute Walk   Phase Initial  -    Distance 1562 feet  -    Walk Time 6 minutes  -    #  of Rest Breaks 0  -    MPH  - 2.96    METS  - 4.65    RPE 11  -    VO2 Peak  - 16.27    Symptoms No  -    Resting HR 97 bpm  -    Resting BP 133/86  -    Max Ex. HR 127 bpm  -    Max Ex. BP 153/96  -    2 Minute Post BP 139/97  -       Oxygen Initial Assessment:   Oxygen Re-Evaluation:   Oxygen Discharge (Final Oxygen Re-Evaluation):   Initial Exercise Prescription:     Initial Exercise Prescription - 09/10/16 1200      Date of Initial Exercise RX and Referring Provider   Date 09/10/16   Referring Provider Glori Bickers MD     Treadmill   MPH 2.4   Grade 1   Minutes 10   METs 3.08     Recumbant Bike   Level 2   Minutes 10   METs 2     NuStep   Level 3   SPM 70   Minutes 10   METs 2     Intensity   THRR 40-80% of Max Heartrate 71-142   Ratings of Perceived Exertion 11-13   Perceived Dyspnea 0-4     Progression   Progression Continue to progress workloads to maintain intensity without signs/symptoms of physical distress.     Resistance Training   Training Prescription Yes   Weight 1lb   Reps 10-15      Perform Capillary Blood Glucose checks as needed.  Exercise Prescription Changes:   Exercise Comments:   Exercise Goals and Review:     Exercise Goals    Row Name 09/10/16 0927 09/10/16 1156           Exercise Goals   Increase Physical Activity (P)  Yes Yes      Intervention (P)  Provide advice, education, support and counseling about physical activity/exercise needs.;Develop an individualized exercise prescription for aerobic and resistive training based on  initial evaluation findings, risk stratification, comorbidities and participant's personal goals. Provide advice, education, support and counseling about physical activity/exercise needs.;Develop an individualized exercise prescription for aerobic and resistive training based on initial evaluation findings, risk stratification, comorbidities and participant's personal goals.      Expected Outcomes (P)  Achievement of increased cardiorespiratory fitness and enhanced flexibility, muscular endurance and strength shown through measurements of functional capacity and personal statement of participant. Achievement of increased cardiorespiratory fitness and enhanced flexibility, muscular endurance and strength shown through measurements of functional capacity and personal statement of participant.      Increase Strength and Stamina (P)  Yes Yes      Intervention (P)  Develop an individualized exercise prescription for aerobic and resistive training based on initial evaluation findings, risk stratification, comorbidities and participant's personal goals.;Provide advice, education, support and counseling about physical activity/exercise needs. Provide advice, education, support and counseling about physical activity/exercise needs.;Develop an individualized exercise prescription for aerobic and resistive training based on initial evaluation findings, risk stratification, comorbidities and participant's personal goals.      Expected Outcomes (P)  Achievement of increased cardiorespiratory fitness and enhanced flexibility, muscular endurance and strength shown through measurements of functional capacity and personal statement of participant. Achievement of increased cardiorespiratory fitness and enhanced flexibility, muscular endurance and strength shown through measurements of functional capacity and personal statement of participant.  Exercise Goals Re-Evaluation :    Discharge Exercise Prescription (Final  Exercise Prescription Changes):   Nutrition:  Target Goals: Understanding of nutrition guidelines, daily intake of sodium 1500mg , cholesterol 200mg , calories 30% from fat and 7% or less from saturated fats, daily to have 5 or more servings of fruits and vegetables.  Biometrics:     Pre Biometrics - 09/10/16 1139      Pre Biometrics   Waist Circumference 43 inches   Hip Circumference 51 inches   Waist to Hip Ratio 0.84 %   Triceps Skinfold 58 mm   % Body Fat 50 %   Grip Strength 36 kg   Flexibility 14 in   Single Leg Stand 17.75 seconds       Nutrition Therapy Plan and Nutrition Goals:   Nutrition Discharge: Nutrition Scores:   Nutrition Goals Re-Evaluation:   Nutrition Goals Re-Evaluation:   Nutrition Goals Discharge (Final Nutrition Goals Re-Evaluation):   Psychosocial: Target Goals: Acknowledge presence or absence of significant depression and/or stress, maximize coping skills, provide positive support system. Participant is able to verbalize types and ability to use techniques and skills needed for reducing stress and depression.  Initial Review & Psychosocial Screening:     Initial Psych Review & Screening - 09/10/16 1605      Initial Review   Current issues with Current Stress Concerns;Current Anxiety/Panic   Source of Stress Concerns Chronic Illness;Unable to participate in former interests or hobbies;Family   Comments Pt has young daughter and wonders with her current health issues what the future will be.     Family Dynamics   Good Support System? Yes     Barriers   Psychosocial barriers to participate in program The patient should benefit from training in stress management and relaxation.     Screening Interventions   Interventions Encouraged to exercise      Quality of Life Scores:     Quality of Life - 09/10/16 1157      Quality of Life Scores   Health/Function Pre 5.77 %   Socioeconomic Pre 16.86 %   Psych/Spiritual Pre 9.57 %    Family Pre 26.1 %   GLOBAL Pre 11.82 %      PHQ-9: Recent Review Flowsheet Data    Depression screen [facility] 2/9 02/16/2015 12/27/2013   Decreased Interest 0 3   Down, Depressed, Hopeless 0 3   PHQ - 2 Score 0 6   Altered sleeping - 1   Tired, decreased energy - 1   Change in appetite - 0   Feeling bad or failure about yourself  - 3   Trouble concentrating - 3   Moving slowly or fidgety/restless - 3   Suicidal thoughts - 0   PHQ-9 Score - 17     Interpretation of Total Score  Total Score Depression Severity:  1-4 = Minimal depression, 5-9 = Mild depression, 10-14 = Moderate depression, 15-19 = Moderately severe depression, 20-27 = Severe depression   Psychosocial Evaluation and Intervention:   Psychosocial Re-Evaluation:   Psychosocial Discharge (Final Psychosocial Re-Evaluation):   Vocational Rehabilitation: Provide vocational rehab assistance to qualifying candidates.   Vocational Rehab Evaluation & Intervention:     Vocational Rehab - 09/10/16 1606      Initial Vocational Rehab Evaluation & Intervention   Assessment shows need for Vocational Rehabilitation No      Education: Education Goals: Education classes will be provided on a weekly basis, covering required topics. Participant will state understanding/return demonstration of topics presented.  Learning Barriers/Preferences:     Learning Barriers/Preferences - 09/10/16 2130      Learning Barriers/Preferences   Learning Barriers Sight   Learning Preferences Written Material      Education Topics: Count Your Pulse:  -Group instruction provided by verbal instruction, demonstration, patient participation and written materials to support subject.  Instructors address importance of being able to find your pulse and how to count your pulse when at home without a heart monitor.  Patients get hands on experience counting their pulse with staff help and individually.   Heart Attack, Angina, and Risk Factor  Modification:  -Group instruction provided by verbal instruction, video, and written materials to support subject.  Instructors address signs and symptoms of angina and heart attacks.    Also discuss risk factors for heart disease and how to make changes to improve heart health risk factors.   Functional Fitness:  -Group instruction provided by verbal instruction, demonstration, patient participation, and written materials to support subject.  Instructors address safety measures for doing things around the house.  Discuss how to get up and down off the floor, how to pick things up properly, how to safely get out of a chair without assistance, and balance training.   Meditation and Mindfulness:  -Group instruction provided by verbal instruction, patient participation, and written materials to support subject.  Instructor addresses importance of mindfulness and meditation practice to help reduce stress and improve awareness.  Instructor also leads participants through a meditation exercise.    Stretching for Flexibility and Mobility:  -Group instruction provided by verbal instruction, patient participation, and written materials to support subject.  Instructors lead participants through series of stretches that are designed to increase flexibility thus improving mobility.  These stretches are additional exercise for major muscle groups that are typically performed during regular warm up and cool down.   Hands Only CPR Anytime:  -Group instruction provided by verbal instruction, video, patient participation and written materials to support subject.  Instructors co-teach with AHA video for hands only CPR.  Participants get hands on experience with mannequins.   Nutrition I class: Heart Healthy Eating:  -Group instruction provided by PowerPoint slides, verbal discussion, and written materials to support subject matter. The instructor gives an explanation and review of the Therapeutic Lifestyle  Changes diet recommendations, which includes a discussion on lipid goals, dietary fat, sodium, fiber, plant stanol/sterol esters, sugar, and the components of a well-balanced, healthy diet.   Nutrition II class: Lifestyle Skills:  -Group instruction provided by PowerPoint slides, verbal discussion, and written materials to support subject matter. The instructor gives an explanation and review of label reading, grocery shopping for heart health, heart healthy recipe modifications, and ways to make healthier choices when eating out.   Diabetes Question & Answer:  -Group instruction provided by PowerPoint slides, verbal discussion, and written materials to support subject matter. The instructor gives an explanation and review of diabetes co-morbidities, pre- and post-prandial blood glucose goals, pre-exercise blood glucose goals, signs, symptoms, and treatment of hypoglycemia and hyperglycemia, and foot care basics.   Diabetes Blitz:  -Group instruction provided by PowerPoint slides, verbal discussion, and written materials to support subject matter. The instructor gives an explanation and review of the physiology behind type 1 and type 2 diabetes, diabetes medications and rational behind using different medications, pre- and post-prandial blood glucose recommendations and Hemoglobin A1c goals, diabetes diet, and exercise including blood glucose guidelines for exercising safely.    Portion Distortion:  -Group instruction provided by PowerPoint  slides, verbal discussion, written materials, and food models to support subject matter. The instructor gives an explanation of serving size versus portion size, changes in portions sizes over the last 20 years, and what consists of a serving from each food group.   Stress Management:  -Group instruction provided by verbal instruction, video, and written materials to support subject matter.  Instructors review role of stress in heart disease and how to cope  with stress positively.     Exercising on Your Own:  -Group instruction provided by verbal instruction, power point, and written materials to support subject.  Instructors discuss benefits of exercise, components of exercise, frequency and intensity of exercise, and end points for exercise.  Also discuss use of nitroglycerin and activating EMS.  Review options of places to exercise outside of rehab.  Review guidelines for sex with heart disease.   Cardiac Drugs I:  -Group instruction provided by verbal instruction and written materials to support subject.  Instructor reviews cardiac drug classes: antiplatelets, anticoagulants, beta blockers, and statins.  Instructor discusses reasons, side effects, and lifestyle considerations for each drug class.   Cardiac Drugs II:  -Group instruction provided by verbal instruction and written materials to support subject.  Instructor reviews cardiac drug classes: angiotensin converting enzyme inhibitors (ACE-I), angiotensin II receptor blockers (ARBs), nitrates, and calcium channel blockers.  Instructor discusses reasons, side effects, and lifestyle considerations for each drug class.   Anatomy and Physiology of the Circulatory System:  -Group instruction provided by verbal instruction, video, and written materials to support subject.  Reviews functional anatomy of heart, how it relates to various diagnoses, and what role the heart plays in the overall system.   Knowledge Questionnaire Score:     Knowledge Questionnaire Score - 09/10/16 1133      Knowledge Questionnaire Score   Pre Score 23/24      Core Components/Risk Factors/Patient Goals at Admission:     Personal Goals and Risk Factors at Admission - 09/10/16 1139      Core Components/Risk Factors/Patient Goals on Admission    Weight Management Yes;Obesity;Weight Maintenance;Weight Loss   Intervention Weight Management/Obesity: Establish reasonable short term and long term weight  goals.;Weight Management: Provide education and appropriate resources to help participant work on and attain dietary goals.;Weight Management: Develop a combined nutrition and exercise program designed to reach desired caloric intake, while maintaining appropriate intake of nutrient and fiber, sodium and fats, and appropriate energy expenditure required for the weight goal.;Obesity: Provide education and appropriate resources to help participant work on and attain dietary goals.   Expected Outcomes Short Term: Continue to assess and modify interventions until short term weight is achieved;Long Term: Adherence to nutrition and physical activity/exercise program aimed toward attainment of established weight goal;Weight Maintenance: Understanding of the daily nutrition guidelines, which includes 25-35% calories from fat, 7% or less cal from saturated fats, less than 200mg  cholesterol, less than 1.5gm of sodium, & 5 or more servings of fruits and vegetables daily;Weight Loss: Understanding of general recommendations for a balanced deficit meal plan, which promotes 1-2 lb weight loss per week and includes a negative energy balance of [phone] kcal/d;Understanding recommendations for meals to include 15-35% energy as protein, 25-35% energy from fat, 35-60% energy from carbohydrates, less than 200mg  of dietary cholesterol, 20-35 gm of total fiber daily;Understanding of distribution of calorie intake throughout the day with the consumption of 4-5 meals/snacks   Diabetes Yes   Intervention Provide education about signs/symptoms and action to take for hypo/hyperglycemia.;Provide education about proper  nutrition, including hydration, and aerobic/resistive exercise prescription along with prescribed medications to achieve blood glucose in normal ranges: Fasting glucose 65-99 mg/dL   Expected Outcomes Short Term: Participant verbalizes understanding of the signs/symptoms and immediate care of hyper/hypoglycemia, proper foot  care and importance of medication, aerobic/resistive exercise and nutrition plan for blood glucose control.;Long Term: Attainment of HbA1C < 7%.   Heart Failure Yes   Intervention Provide a combined exercise and nutrition program that is supplemented with education, support and counseling about heart failure. Directed toward relieving symptoms such as shortness of breath, decreased exercise tolerance, and extremity edema.   Expected Outcomes Improve functional capacity of life;Short term: Attendance in program 2-3 days a week with increased exercise capacity. Reported lower sodium intake. Reported increased fruit and vegetable intake. Reports medication compliance.;Short term: Daily weights obtained and reported for increase. Utilizing diuretic protocols set by physician.;Long term: Adoption of self-care skills and reduction of barriers for early signs and symptoms recognition and intervention leading to self-care maintenance.   Hypertension Yes   Intervention Provide education on lifestyle modifcations including regular physical activity/exercise, weight management, moderate sodium restriction and increased consumption of fresh fruit, vegetables, and low fat dairy, alcohol moderation, and smoking cessation.;Monitor prescription use compliance.   Expected Outcomes Short Term: Continued assessment and intervention until BP is < 140/63mm HG in hypertensive participants. < 130/44mm HG in hypertensive participants with diabetes, heart failure or chronic kidney disease.;Long Term: Maintenance of blood pressure at goal levels.   Stress Yes   Intervention Offer individual and/or small group education and counseling on adjustment to heart disease, stress management and health-related lifestyle change. Teach and support self-help strategies.;Refer participants experiencing significant psychosocial distress to appropriate mental health specialists for further evaluation and treatment. When possible, include family  members and significant others in education/counseling sessions.   Expected Outcomes Short Term: Participant demonstrates changes in health-related behavior, relaxation and other stress management skills, ability to obtain effective social support, and compliance with psychotropic medications if prescribed.;Long Term: Emotional wellbeing is indicated by absence of clinically significant psychosocial distress or social isolation.      Core Components/Risk Factors/Patient Goals Review:    Core Components/Risk Factors/Patient Goals at Discharge (Final Review):    ITP Comments:     ITP Comments    Row Name 09/10/16 1053           ITP Comments Medical Director, Dr. Fransico Him           Comments:  Pt in today for Phase II cardiac rehab orientation from 0800 to 10:15.  As a part of the orientation appt, pt competed 5 minutes of warm up stretches and 6 minute walk test.  Pt tolerated well with no complaints of cp or sob.  Pt surprised herself on how well she was able to complete the walk test. Monitor showed SR with freq pvc.  This is chronic for this pt and is present on ekg tracing and rhythm strip. Pt did have elevation of bp for walk test but admitted due to the early in the day appt she had not had her medications yet. Brief psychosocial assessment reveals no immediate barriers to cardiac rehab.  Pt is well known to rehab staff and pt appears less anxious and withdrawn verses previous encounters.  Pt recently had ICD placed and has received clearance to participate in cardiac rehab with weight limit restrictions.  Pt feels the ICD gives her the reassurance should her hear get into trouble the ICD will shock her  back to a normal rhythm. Pt did not sleep well prior to this because she was so afraid something would happen and she would not be around to help raise her young daughter.  Pt admits this is the best she has felt in 2 years.  Pt is looking forward to beginning and competing her rehab  program. Maurice Small RN, BSN Cardiac and Pulmonary Rehab Nurse Navigator

## 2016-09-14 ENCOUNTER — Encounter (HOSPITAL_COMMUNITY): Payer: BLUE CROSS/BLUE SHIELD

## 2016-09-14 ENCOUNTER — Other Ambulatory Visit: Payer: Self-pay | Admitting: Internal Medicine

## 2016-09-16 ENCOUNTER — Encounter (HOSPITAL_COMMUNITY)
Admission: RE | Admit: 2016-09-16 | Discharge: 2016-09-16 | Disposition: A | Discharge status: Home / Self Care | Payer: BLUE CROSS/BLUE SHIELD | Source: Ambulatory Visit | Attending: Internal Medicine | Admitting: Internal Medicine

## 2016-09-16 ENCOUNTER — Encounter: Payer: Self-pay | Admitting: Interventional Cardiology

## 2016-09-16 ENCOUNTER — Encounter: Payer: Self-pay | Admitting: Family

## 2016-09-16 ENCOUNTER — Encounter (HOSPITAL_COMMUNITY): Payer: BLUE CROSS/BLUE SHIELD

## 2016-09-16 ENCOUNTER — Encounter (HOSPITAL_COMMUNITY): Payer: Self-pay

## 2016-09-16 DIAGNOSIS — I428 Other cardiomyopathies: Secondary | ICD-10-CM

## 2016-09-16 DIAGNOSIS — I5042 Chronic combined systolic (congestive) and diastolic (congestive) heart failure: Secondary | ICD-10-CM | POA: Diagnosis not present

## 2016-09-16 LAB — GLUCOSE, CAPILLARY
Glucose-Capillary: 105 mg/dL — ABNORMAL HIGH (ref 65–99)
Glucose-Capillary: 107 mg/dL — ABNORMAL HIGH (ref 65–99)

## 2016-09-16 NOTE — Progress Notes (Signed)
Daily Session Note  Patient Details  Name: [name] MRN: [id number] Date of Birth: [birth year] Referring Provider:     Juno Ridge from 09/10/2016 in [place]  Referring Provider  Glori Bickers MD      Encounter Date: 09/16/2016  Check In:     Session Check In - 09/16/16 1441      Check-In   Location [facility]   Staff Present Luetta Nutting Fair, MS, ACSM RCEP, Exercise Physiologist;Lariza Cothron, RN, Marga Melnick, RN, Deland Pretty, MS, ACSM CEP, Exercise Physiologist   Supervising physician immediately available to respond to emergencies [facility] immediately available   Physician(s) Dr Posey Pronto   Medication changes reported     No   Fall or balance concerns reported    No   Tobacco Cessation No Change   Warm-up and Cool-down Not performed (comment)   Resistance Training Performed No   VAD Patient? No     Pain Assessment   Currently in Pain? No/denies      Capillary Blood Glucose: Results for orders placed or performed during the hospital encounter of 09/16/16 (from the past 24 hour(s))  Glucose, capillary     Status: Abnormal   Collection Time: 09/16/16  1:31 PM  Result Value Ref Range   Glucose-Capillary 105 (H) 65 - 99 mg/dL  Glucose, capillary     Status: Abnormal   Collection Time: 09/16/16  2:20 PM  Result Value Ref Range   Glucose-Capillary 107 (H) 65 - 99 mg/dL        Exercise Prescription Changes - 09/16/16 1600      Response to Exercise   Blood Pressure (Admit) 126/80   Blood Pressure (Exercise) 104/76   Blood Pressure (Exit) 112/62   Heart Rate (Admit) 91 bpm   Heart Rate (Exercise) 129 bpm   Heart Rate (Exit) 100 bpm   Rating of Perceived Exertion (Exercise) 13   Comments pt was oriented to exercise equipment and responded well to exercise   Duration Continue with 30 min of aerobic exercise without signs/symptoms of physical distress.   Intensity THRR  unchanged     Progression   Progression Continue to progress workloads to maintain intensity without signs/symptoms of physical distress.     Resistance Training   Training Prescription Yes   Weight 1lbs   Reps 10-15   Time 10 Minutes     Treadmill   MPH 2.4   Grade 1   Minutes 10   METs 3.08     Recumbant Bike   Level 2   Minutes 10   METs 2     NuStep   Level 3   SPM 80   Minutes 10   METs 2      History  Smoking Status  . Never Smoker  Smokeless Tobacco  . Never Used    Goals Met:  Exercise tolerated well  Goals Unmet:  Not Applicable  Comments: Pt started cardiac rehab today.  Pt tolerated light exercise without difficulty. VSS, telemetry-sinus  , asymptomatic.  Medication list reconciled. Pt denies barriers to medicaiton compliance.  PSYCHOSOCIAL ASSESSMENT:  PHQ-14. Pt with known depression and anxiety disorder, currently followed by Dr. Buddy Duty. Pt has decreased health related anxiety and improved outlook.  Pt is looking forward to social interactions and sense of purpose cardiac rehab participation will create.     Pt oriented to exercise equipment and routine.    Understanding verbalized.   Dr. Fransico Him  is Market researcher for Cardiac Rehab at [facility].

## 2016-09-17 ENCOUNTER — Other Ambulatory Visit: Payer: Self-pay

## 2016-09-17 ENCOUNTER — Encounter: Payer: Self-pay | Admitting: Emergency Medicine

## 2016-09-17 ENCOUNTER — Other Ambulatory Visit: Payer: Self-pay | Admitting: Emergency Medicine

## 2016-09-17 MED ORDER — INSULIN PEN NEEDLE 31G X 6 MM MISC: 0 refills | Status: DC

## 2016-09-17 MED ORDER — FUROSEMIDE 80 MG PO TABS: 80.0000 mg | ORAL_TABLET | Freq: Two times a day (BID) | ORAL | 6 refills | Status: DC

## 2016-09-17 NOTE — Telephone Encounter (Signed)
Opened in error

## 2016-09-18 ENCOUNTER — Encounter (HOSPITAL_COMMUNITY): Payer: BLUE CROSS/BLUE SHIELD

## 2016-09-21 ENCOUNTER — Encounter (HOSPITAL_COMMUNITY): Payer: BLUE CROSS/BLUE SHIELD

## 2016-09-21 ENCOUNTER — Other Ambulatory Visit: Payer: Self-pay | Admitting: *Deleted

## 2016-09-21 MED ORDER — INSULIN PEN NEEDLE 32G X 6 MM MISC: 1 refills | Status: DC

## 2016-09-22 ENCOUNTER — Ambulatory Visit: Payer: BLUE CROSS/BLUE SHIELD | Admitting: Family

## 2016-09-23 ENCOUNTER — Encounter (HOSPITAL_COMMUNITY)
Admission: RE | Admit: 2016-09-23 | Discharge: 2016-09-23 | Disposition: A | Discharge status: Home / Self Care | Payer: BLUE CROSS/BLUE SHIELD | Source: Ambulatory Visit | Attending: Internal Medicine | Admitting: Internal Medicine

## 2016-09-23 ENCOUNTER — Encounter (HOSPITAL_COMMUNITY): Payer: BLUE CROSS/BLUE SHIELD

## 2016-09-23 DIAGNOSIS — I5042 Chronic combined systolic (congestive) and diastolic (congestive) heart failure: Secondary | ICD-10-CM | POA: Diagnosis present

## 2016-09-23 DIAGNOSIS — I428 Other cardiomyopathies: Secondary | ICD-10-CM

## 2016-09-23 LAB — GLUCOSE, CAPILLARY: Glucose-Capillary: 112 mg/dL — ABNORMAL HIGH (ref 65–99)

## 2016-09-23 NOTE — Patient Instructions (Signed)

## 2016-09-25 ENCOUNTER — Encounter (HOSPITAL_COMMUNITY)
Admission: RE | Admit: 2016-09-25 | Discharge: 2016-09-25 | Disposition: A | Discharge status: Home / Self Care | Payer: BLUE CROSS/BLUE SHIELD | Source: Ambulatory Visit | Attending: Internal Medicine | Admitting: Internal Medicine

## 2016-09-25 ENCOUNTER — Encounter (HOSPITAL_COMMUNITY): Payer: BLUE CROSS/BLUE SHIELD

## 2016-09-25 DIAGNOSIS — I428 Other cardiomyopathies: Secondary | ICD-10-CM

## 2016-09-25 DIAGNOSIS — I5042 Chronic combined systolic (congestive) and diastolic (congestive) heart failure: Secondary | ICD-10-CM | POA: Diagnosis not present

## 2016-09-25 NOTE — Progress Notes (Signed)
Reviewed home exercise with pt today.  Pt plans to walk for exercise. Encouraged 150 minutes of aerobic activities per week. Pt plans to walk 2 additional days per week outside of coming to cardiac rehab. Reviewed THR, pulse, RPE, sign and symptoms, and when to call 911 or MD.  Also discussed weather considerations and indoor options.  Pt voiced understanding.    [name] [employer]

## 2016-09-28 ENCOUNTER — Encounter (HOSPITAL_COMMUNITY)
Admission: RE | Admit: 2016-09-28 | Discharge: 2016-09-28 | Disposition: A | Discharge status: Home / Self Care | Payer: BLUE CROSS/BLUE SHIELD | Source: Ambulatory Visit | Attending: Internal Medicine | Admitting: Internal Medicine

## 2016-09-28 ENCOUNTER — Encounter (HOSPITAL_COMMUNITY): Payer: BLUE CROSS/BLUE SHIELD

## 2016-09-28 DIAGNOSIS — I5042 Chronic combined systolic (congestive) and diastolic (congestive) heart failure: Secondary | ICD-10-CM | POA: Diagnosis not present

## 2016-09-28 DIAGNOSIS — I428 Other cardiomyopathies: Secondary | ICD-10-CM

## 2016-09-30 ENCOUNTER — Encounter (HOSPITAL_COMMUNITY)
Admission: RE | Admit: 2016-09-30 | Discharge: 2016-09-30 | Disposition: A | Discharge status: Home / Self Care | Payer: BLUE CROSS/BLUE SHIELD | Source: Ambulatory Visit | Attending: Internal Medicine | Admitting: Internal Medicine

## 2016-09-30 ENCOUNTER — Encounter (HOSPITAL_COMMUNITY): Payer: BLUE CROSS/BLUE SHIELD

## 2016-10-02 ENCOUNTER — Encounter (HOSPITAL_COMMUNITY)
Admission: RE | Admit: 2016-10-02 | Discharge: 2016-10-02 | Disposition: A | Discharge status: Home / Self Care | Payer: BLUE CROSS/BLUE SHIELD | Source: Ambulatory Visit | Attending: Internal Medicine | Admitting: Internal Medicine

## 2016-10-02 ENCOUNTER — Encounter (HOSPITAL_COMMUNITY): Payer: BLUE CROSS/BLUE SHIELD

## 2016-10-02 DIAGNOSIS — I428 Other cardiomyopathies: Secondary | ICD-10-CM

## 2016-10-02 DIAGNOSIS — I5042 Chronic combined systolic (congestive) and diastolic (congestive) heart failure: Secondary | ICD-10-CM | POA: Diagnosis not present

## 2016-10-02 LAB — GLUCOSE, CAPILLARY: Glucose-Capillary: 97 mg/dL (ref 65–99)

## 2016-10-05 ENCOUNTER — Encounter (HOSPITAL_COMMUNITY): Payer: BLUE CROSS/BLUE SHIELD

## 2016-10-05 ENCOUNTER — Encounter (HOSPITAL_COMMUNITY)
Admission: RE | Admit: 2016-10-05 | Discharge: 2016-10-05 | Disposition: A | Discharge status: Home / Self Care | Payer: BLUE CROSS/BLUE SHIELD | Source: Ambulatory Visit | Attending: Internal Medicine | Admitting: Internal Medicine

## 2016-10-05 DIAGNOSIS — I5042 Chronic combined systolic (congestive) and diastolic (congestive) heart failure: Secondary | ICD-10-CM | POA: Diagnosis not present

## 2016-10-05 DIAGNOSIS — I428 Other cardiomyopathies: Secondary | ICD-10-CM

## 2016-10-07 ENCOUNTER — Ambulatory Visit (HOSPITAL_COMMUNITY)
Admission: RE | Admit: 2016-10-07 | Discharge: 2016-10-07 | Disposition: A | Discharge status: Home / Self Care | Payer: BLUE CROSS/BLUE SHIELD | Source: Ambulatory Visit | Attending: Internal Medicine | Admitting: Internal Medicine

## 2016-10-07 ENCOUNTER — Encounter (HOSPITAL_COMMUNITY): Payer: BLUE CROSS/BLUE SHIELD

## 2016-10-07 ENCOUNTER — Encounter (HOSPITAL_COMMUNITY): Payer: Self-pay | Admitting: Internal Medicine

## 2016-10-07 ENCOUNTER — Encounter: Payer: Self-pay | Admitting: Gynecology

## 2016-10-07 VITALS — BP 126/88 | HR 69 | Ht 65.5 in | Wt 233.0 lb

## 2016-10-07 DIAGNOSIS — I5042 Chronic combined systolic (congestive) and diastolic (congestive) heart failure: Secondary | ICD-10-CM

## 2016-10-07 DIAGNOSIS — F419 Anxiety disorder, unspecified: Secondary | ICD-10-CM | POA: Insufficient documentation

## 2016-10-07 DIAGNOSIS — Z881 Allergy status to other antibiotic agents status: Secondary | ICD-10-CM | POA: Diagnosis not present

## 2016-10-07 DIAGNOSIS — Z95810 Presence of automatic (implantable) cardiac defibrillator: Secondary | ICD-10-CM | POA: Diagnosis not present

## 2016-10-07 DIAGNOSIS — I110 Hypertensive heart disease with heart failure: Secondary | ICD-10-CM | POA: Insufficient documentation

## 2016-10-07 DIAGNOSIS — Z79899 Other long term (current) drug therapy: Secondary | ICD-10-CM | POA: Diagnosis not present

## 2016-10-07 DIAGNOSIS — F329 Major depressive disorder, single episode, unspecified: Secondary | ICD-10-CM | POA: Diagnosis not present

## 2016-10-07 DIAGNOSIS — I493 Ventricular premature depolarization: Secondary | ICD-10-CM

## 2016-10-07 DIAGNOSIS — G4733 Obstructive sleep apnea (adult) (pediatric): Secondary | ICD-10-CM | POA: Insufficient documentation

## 2016-10-07 DIAGNOSIS — E119 Type 2 diabetes mellitus without complications: Secondary | ICD-10-CM | POA: Diagnosis not present

## 2016-10-07 DIAGNOSIS — I5022 Chronic systolic (congestive) heart failure: Secondary | ICD-10-CM | POA: Diagnosis present

## 2016-10-07 DIAGNOSIS — Z794 Long term (current) use of insulin: Secondary | ICD-10-CM | POA: Diagnosis not present

## 2016-10-07 LAB — BASIC METABOLIC PANEL
Anion gap: 6 (ref 5–15)
BUN: 7 mg/dL (ref 6–20)
CO2: 28 mmol/L (ref 22–32)
Calcium: 9.2 mg/dL (ref 8.9–10.3)
Chloride: 103 mmol/L (ref 101–111)
Creatinine, Ser: 0.88 mg/dL (ref 0.44–1.00)
GFR calc Af Amer: >60 mL/min (ref >60)
GFR calc non Af Amer: >60 mL/min (ref >60)
Glucose, Bld: 92 mg/dL (ref 65–99)
Potassium: 4.1 mmol/L (ref 3.5–5.1)
Sodium: 137 mmol/L (ref 135–145)

## 2016-10-07 NOTE — Progress Notes (Signed)
Advanced HF Clinic Note  Date:  10/07/2016   ID:  TARRYN BOGDAN, DOB [birth year], MRN [id number]  PCP:  [place], FNP  Cardiologist: Pernell Dupre, MD   History of Present Illness:  [name] is a 41 y.o. 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   Cath 6/17 revealed normal vessels, moderate elevation in pulmonary artery pressures, and LVEF 25-30%.  RHC 6/17 RA 5 RV 35/6 PA 41/12 PW 11 Fick 6.4/3.0 PA sat 62%  Echo 6/15 35-40% Echo 6/17 25-30% Echo 12/17 20-25%  CPX 2/8  FVC 2.57 (79%)    FEV1 2.14 (81%)     FEV1/FVC 83 (101%)     MVV 107 (102%) Resting HR: 106 Peak HR: 166  (93% age predicted max HR)  BP rest: 136/98 BP peak: 174/88 Peak VO2: 17.1 (85% predicted peak VO2) - corrects to 28.4 for ibw VE/VCO2 slope: 33 OUES: 2.16  Peak RER: 1.09 Ventilatory Threshold: 14.6 (73% predicted or measured peak VO2) VE/MVV: 59% PETCO2 at peak: 33 O2pulse: 10  (83% predicted O2pulse)   She returns today for HF follow up, overall feeling well. Denies SOB with walking throughout the house, no SOB with walking around stores or stairs. Taking all medications. Weights at home 230-236 pounds. Goes to cardiac rehab 3 times a week. Drinking more than 2L a day, eating a fairly low salt diet. Not taking any extra lasix, has been taking 40mg  at night instead of 80 mg. But uses the 80mg  if her weight is up. Wants info today about the [facility] heart failure program.    Past Medical History:  Diagnosis Date  . AICD (automatic cardioverter/defibrillator) present   . Anxiety   . Arthritis    right shoulder   . Asthma   . CHF (congestive heart failure) ([place])   . Depression   . Diabetes mellitus without complication ([place])   . Dyspnea    comes and goes intermittently  mostly with exertion   . Fibroid    age 24  . Gallstones   . Hypertension   . IBS (irritable bowel syndrome)   . Migraine    "monthly" (08/03/2016)  . OSA on CPAP   . Ovarian cyst    1999; surgically removed  . 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 Seizures ([place])    "as a child" (08/03/2016)  . Termination of pregnancy    due to cardiac risk    Past Surgical History:  Procedure Laterality Date  . CARDIAC CATHETERIZATION N/A 11/11/2015   Procedure: Right/Left Heart Cath and Coronary Angiography;  Surgeon: Troy Sine, MD;  Location: [place] [facility];  Service: Cardiovascular;  Laterality: N/A;  . CARDIAC CATHETERIZATION  ~ 2015  . CARDIAC DEFIBRILLATOR PLACEMENT  08/03/2016  . CESAREAN SECTION  1999  . COLONOSCOPY WITH PROPOFOL N/A 04/21/2016   Procedure: COLONOSCOPY WITH PROPOFOL;  Surgeon: Jerene Bears, MD;  Location: [facility];  Service: Gastroenterology;  Laterality: N/A;  . ESOPHAGOGASTRODUODENOSCOPY (EGD) WITH PROPOFOL N/A 04/21/2016   Procedure: ESOPHAGOGASTRODUODENOSCOPY (EGD) WITH PROPOFOL;  Surgeon: Jerene Bears, MD;  Location: [facility];  Service: Gastroenterology;  Laterality: N/A;  . FOOT FRACTURE SURGERY Right ~ 2003  . FRACTURE SURGERY    . ICD IMPLANT N/A 08/03/2016   Procedure: ICD Implant;  Surgeon: Deboraha Sprang, MD;  Location: [place] [facility];  Service: Cardiovascular;  Laterality: N/A;  . LAPAROSCOPIC CHOLECYSTECTOMY  12/2006  . LAPAROSCOPY ABDOMEN DIAGNOSTIC  2008   "cut bile duct w/gallbladder OR; had to go in later & fix leak; hospitalized for 2 months"  . LEFT HEART CATHETERIZATION WITH CORONARY ANGIOGRAM N/A 02/26/2014   Procedure: LEFT HEART CATHETERIZATION WITH CORONARY ANGIOGRAM;  Surgeon: Jettie Booze, MD;  Location: [facility] CATH LAB;  Service: Cardiovascular;  Laterality: N/A;  . OVARIAN CYST REMOVAL Right 1999  . TUBAL LIGATION  1999    Current Medications:  Current Meds  Medication Sig  . albuterol  (PROVENTIL HFA;VENTOLIN HFA) 108 (90 Base) MCG/ACT inhaler Inhale 2 puffs into the lungs every 6 (six) hours as needed for wheezing or shortness of breath.  . Armodafinil (NUVIGIL) 250 MG tablet Take 250 mg by mouth daily.  . budesonide-formoterol (SYMBICORT) 160-4.5 MCG/ACT inhaler Inhale 2 puffs into the lungs 2 (two) times daily. (Patient taking differently: Inhale 2 puffs into the lungs 2 (two) times daily as needed (for shortness of breath). )  . carvedilol (COREG) 25 MG tablet Take 1 tablet (25 mg total) by mouth 2 (two) times daily with a meal.  . colestipol (COLESTID) 1 g tablet Take 2 tablets (2 g total) by mouth daily.  . cyclobenzaprine (FLEXERIL) 10 MG tablet Take 1 tablet (10 mg total) by mouth 3 (three) times daily as needed for muscle spasms.  . Diclofenac Sodium 2 % SOLN Apply 1 pump twice daily. (Patient taking differently: Apply 1 application topically as needed (for pain). )  . dicyclomine (BENTYL) 20 MG tablet Take 1 tablet (20 mg total) by mouth 2 (two) times daily with a meal. (Patient taking differently: Take 20 mg by mouth 2 (two) times daily as needed for spasms. )  . fluticasone (FLONASE) 50 MCG/ACT nasal spray Place 2 sprays into both nostrils daily as needed for allergies.  . furosemide (LASIX) 80 MG tablet Take 1 tablet (80 mg total) by mouth 2 (two) times daily.  Marland Kitchen gabapentin (NEURONTIN) 100 MG capsule Take 100 mg by mouth daily as needed (for pain).   . Insulin Pen Needle (NOVOFINE) 32G X 6 MM MISC Use to administer victoza every day  . Insulin Pen Needle 31G X 6 MM MISC Use to test blood sugars 1-2 times a day as needed.  . ivabradine (CORLANOR) 5 MG TABS tablet Take 1.5 tablets (7.5 mg total) by mouth 2 (two) times daily with a meal.  . ondansetron (ZOFRAN ODT) 4 MG disintegrating tablet Take 1 tablet (4 mg total) by mouth every 8 (eight) hours as needed for nausea or vomiting.  . sacubitril-valsartan (ENTRESTO) 97-103 MG Take 1 tablet by mouth 2 (two) times daily.  Marland Kitchen  spironolactone (ALDACTONE) 25 MG tablet Take 1 tablet (25 mg total) by mouth daily.  . traZODone (DESYREL) 50 MG tablet Take 1 tablet (50 mg total) by mouth at bedtime as needed for sleep.  Marland Kitchen UNABLE TO FIND CPAP  . venlafaxine XR (EFFEXOR-XR) 75 MG 24 hr capsule Take 225 mg by mouth daily with breakfast.  . VICTOZA 18 MG/3ML SOPN Inject 0.3 mLs (1.8 mg total) into the skin daily.  . vitamin B-12 (CYANOCOBALAMIN) 500 MCG tablet Take 500 mcg by mouth daily.  . Vitamin D, Ergocalciferol, (DRISDOL) 50000 units CAPS capsule TAKE ONE CAPSULE BY MOUTH BY MOUTH ONCE A WEEK    Allergies:   Vancomycin; Contrast media [iodinated diagnostic agents]; Aspirin; Ciprofloxacin; and  Sulfa antibiotics   Social History   Social History  . Marital status: Married    Spouse name: N/A  . Number of children: 1  . Years of education: N/A   Occupational History  . stay at home mom    Social History Main Topics  . Smoking status: Never Smoker  . Smokeless tobacco: Never Used  . Alcohol use No  . Drug use: No  . Sexual activity: Yes   Other Topics Concern  . None   Social History Narrative  . None     Family History:  The patient's family history includes Allergies in her daughter; Emphysema in her maternal grandmother; Heart disease (age of onset: 83) in her maternal grandmother; Rheum arthritis in her mother.    PHYSICAL EXAM:   VS:  BP 126/88   Pulse 69   Ht 5' 5.5" (1.664 m)   SpO2 96%    GEN: Well appearing female, NAD.  HEENT: Normal Neck: No JVD. No Carotid bruits, or masses Cardiac: Regular rate and rhythm. no murmurs, rubs, or gallops Respiratory:  Clear to auscultation bilterally, no wheeze.  GI: obese. soft, nontender, nondistended, + bowel sounds.  Extremities: no cyanosis, clubbing or edema. Warm  MS: no deformity or atrophy  Skin: warm and dry, no rash Neuro:  Alert and Oriented x 3, Strength and sensation are intact Psych: relatively flat affect.   Wt Readings from Last 3  Encounters:  09/10/16 242 lb 11.6 oz (110.1 kg)  09/02/16 234 lb (106.1 kg)  08/20/16 231 lb (104.8 kg)      Studies/Labs Reviewed:    Recent Labs: 11/10/2015: Magnesium 2.1 05/28/2016: ALT 8; TSH 1.09 06/24/2016: B Natriuretic Peptide 36.7; Hemoglobin 13.6 07/30/2016: BUN 13; Creatinine, Ser 0.95; Platelets 231; Potassium 3.8; Sodium 138   Lipid Panel    Component Value Date/Time   CHOL 160 11/09/2015 0526   TRIG 70 11/09/2015 0526   HDL 58 11/09/2015 0526   CHOLHDL 2.8 11/09/2015 0526   VLDL 14 11/09/2015 0526   LDLCALC 88 11/09/2015 0526    Additional studies/ records that were reviewed today include:  No recent laboratory data.    ASSESSMENT:    1. Chronic systolic HF:  due to peri-partum CM, onset 1999 --EF 20-25% (12/17) --NYHA I --Volume status stable, continue Lasix 80mg  BID, can use 40mg  at night as she has been doing depending on weight.  - BMET today - Continue Entresto 97/103 mg BID - Continue Coreg 25mg  BID - Continue Spiro 25mg  daily.  - On a great medication regimen.  - Continue corlanor 7.5mg  BID. HR is 69.  - ICD placed in March 2018.  - Will need repeat Echo in 2-3 months  2. Morbid obesity - Doing well with exercise, works out with cardiac rehab 3 times a week and wants to increase to 5 days a week.  3. OSA -Compliant with CPAP.  4. Anxiety/depression - Follows with PCP.  5. DM2 - A1c in Jan. 2018 is 5.3.   BMET today. Follow up in 3 months with an Echo.     Arbutus Leas, NP  10/07/2016 2:30 PM      Patient seen and examined with Jettie Booze, NP. We discussed all aspects of the encounter. I agree with the assessment and plan as stated above.   Doing well NYHA I-II. Volume status looks good. She is s/p recent ICD and site looks good. On exam today in bigeminy and confirmed by ECG. Will interrogate ICD to evaluate  PVC burden. Wil continue to follow closely. If develops increased HF symptoms will need CPX.   Glori Bickers, MD  11:13  PM

## 2016-10-07 NOTE — Patient Instructions (Signed)
Labs today  Your physician recommends that you schedule a follow-up appointment in: 3 months with echocardiogram  

## 2016-10-09 ENCOUNTER — Encounter (HOSPITAL_COMMUNITY)
Admission: RE | Admit: 2016-10-09 | Discharge: 2016-10-09 | Disposition: A | Discharge status: Home / Self Care | Payer: BLUE CROSS/BLUE SHIELD | Source: Ambulatory Visit | Attending: Internal Medicine | Admitting: Internal Medicine

## 2016-10-09 ENCOUNTER — Encounter (HOSPITAL_COMMUNITY): Payer: BLUE CROSS/BLUE SHIELD

## 2016-10-12 ENCOUNTER — Encounter (HOSPITAL_COMMUNITY): Payer: BLUE CROSS/BLUE SHIELD

## 2016-10-12 ENCOUNTER — Encounter (HOSPITAL_COMMUNITY)
Admission: RE | Admit: 2016-10-12 | Discharge: 2016-10-12 | Disposition: A | Discharge status: Home / Self Care | Payer: BLUE CROSS/BLUE SHIELD | Source: Ambulatory Visit | Attending: Internal Medicine | Admitting: Internal Medicine

## 2016-10-12 DIAGNOSIS — I428 Other cardiomyopathies: Secondary | ICD-10-CM

## 2016-10-12 DIAGNOSIS — I5042 Chronic combined systolic (congestive) and diastolic (congestive) heart failure: Secondary | ICD-10-CM | POA: Diagnosis not present

## 2016-10-12 LAB — GLUCOSE, CAPILLARY: Glucose-Capillary: 85 mg/dL (ref 65–99)

## 2016-10-13 ENCOUNTER — Telehealth: Payer: Self-pay | Admitting: *Deleted

## 2016-10-13 NOTE — Telephone Encounter (Signed)
-----   Message from Arbutus Leas, NP sent at 10/13/2016 10:53 AM EDT ----- Gustavus Messing,   I am an NP at the CHF clinic and Dr. Haroldine Laws wanted for this patient to do a remote interrogation as she was in bigeminy during last weeks office visit. Can you help facilitate this?   Thanks,  Jettie Booze

## 2016-10-13 NOTE — Progress Notes (Signed)
Cardiac Individual Treatment Plan  Patient Details  Name: [name] MRN: [id number] Date of Birth: 12-30-1975 Referring Provider:     CARDIAC REHAB PHASE II ORIENTATION from 09/10/2016 in [place]  Referring Provider  Glori Bickers MD      Initial Encounter Date:    CARDIAC REHAB PHASE II ORIENTATION from 09/10/2016 in [place]  Date  09/10/16  Referring Provider  Glori Bickers MD      Visit Diagnosis: NICM (nonischemic cardiomyopathy) ([place])  [name] Home Medications on Admission:  Current Outpatient Prescriptions:  .  albuterol (PROVENTIL HFA;VENTOLIN HFA) 108 (90 Base) MCG/ACT inhaler, Inhale 2 puffs into the lungs every 6 (six) hours as needed for wheezing or shortness of breath., Disp: 1 Inhaler, Rfl: 0 .  Armodafinil (NUVIGIL) 250 MG tablet, Take 250 mg by mouth daily., Disp: , Rfl:  .  budesonide-formoterol (SYMBICORT) 160-4.5 MCG/ACT inhaler, Inhale 2 puffs into the lungs 2 (two) times daily. (Patient taking differently: Inhale 2 puffs into the lungs 2 (two) times daily as needed (for shortness of breath). ), Disp: 1 Inhaler, Rfl: 3 .  carvedilol (COREG) 25 MG tablet, Take 1 tablet (25 mg total) by mouth 2 (two) times daily with a meal., Disp: 180 tablet, Rfl: 3 .  colestipol (COLESTID) 1 g tablet, Take 2 tablets (2 g total) by mouth daily., Disp: 60 tablet, Rfl: 3 .  cyclobenzaprine (FLEXERIL) 10 MG tablet, Take 1 tablet (10 mg total) by mouth 3 (three) times daily as needed for muscle spasms., Disp: 30 tablet, Rfl: 0 .  Diclofenac Sodium 2 % SOLN, Apply 1 pump twice daily. (Patient taking differently: Apply 1 application topically as needed (for pain). ), Disp: 112 g, Rfl: 3 .  dicyclomine (BENTYL) 20 MG tablet, Take 1 tablet (20 mg total) by mouth 2 (two) times daily with a meal. (Patient taking differently: Take 20 mg by mouth 2 (two) times daily as needed for spasms. ), Disp: 60 tablet, Rfl: 2 .   fluticasone (FLONASE) 50 MCG/ACT nasal spray, Place 2 sprays into both nostrils daily as needed for allergies., Disp: 16 g, Rfl: 2 .  furosemide (LASIX) 80 MG tablet, Take 1 tablet (80 mg total) by mouth 2 (two) times daily., Disp: 60 tablet, Rfl: 6 .  gabapentin (NEURONTIN) 100 MG capsule, Take 100 mg by mouth daily as needed (for pain). , Disp: , Rfl:  .  Insulin Pen Needle (NOVOFINE) 32G X 6 MM MISC, Use to administer victoza every day, Disp: 100 each, Rfl: 1 .  Insulin Pen Needle 31G X 6 MM MISC, Use to test blood sugars 1-2 times a day as needed., Disp: 100 each, Rfl: 0 .  ivabradine (CORLANOR) 5 MG TABS tablet, Take 1.5 tablets (7.5 mg total) by mouth 2 (two) times daily with a meal., Disp: 90 tablet, Rfl: 3 .  ondansetron (ZOFRAN ODT) 4 MG disintegrating tablet, Take 1 tablet (4 mg total) by mouth every 8 (eight) hours as needed for nausea or vomiting., Disp: 20 tablet, Rfl: 0 .  potassium chloride (K-DUR) 10 MEQ tablet, Take 1 tablet (10 mEq total) by mouth daily., Disp: 90 tablet, Rfl: 3 .  sacubitril-valsartan (ENTRESTO) 97-103 MG, Take 1 tablet by mouth 2 (two) times daily., Disp: 60 tablet, Rfl: 11 .  spironolactone (ALDACTONE) 25 MG tablet, Take 1 tablet (25 mg total) by mouth daily., Disp: 90 tablet, Rfl: 3 .  traZODone (DESYREL) 50 MG tablet, Take 1 tablet (50 mg  total) by mouth at bedtime as needed for sleep., Disp: 30 tablet, Rfl: 0 .  UNABLE TO FIND, CPAP, Disp: , Rfl:  .  venlafaxine XR (EFFEXOR-XR) 75 MG 24 hr capsule, Take 225 mg by mouth daily with breakfast., Disp: , Rfl:  .  VICTOZA 18 MG/3ML SOPN, Inject 0.3 mLs (1.8 mg total) into the skin daily., Disp: 27 mL, Rfl: 0 .  vitamin B-12 (CYANOCOBALAMIN) 500 MCG tablet, Take 500 mcg by mouth daily., Disp: , Rfl:  .  Vitamin D, Ergocalciferol, (DRISDOL) 50000 units CAPS capsule, TAKE ONE CAPSULE BY MOUTH BY MOUTH ONCE A WEEK, Disp: 4 capsule, Rfl: 1  Past Medical History: Past Medical History:  Diagnosis Date  . AICD  (automatic cardioverter/defibrillator) present   . Anxiety   . Arthritis    right shoulder   . Asthma   . CHF (congestive heart failure) ([place])   . Depression   . Diabetes mellitus without complication ([place])   . Dyspnea    comes and goes intermittently mostly with exertion   . Fibroid    age 21  . Gallstones   . Hypertension   . IBS (irritable bowel syndrome)   . Migraine    "monthly" (08/03/2016)  . OSA on CPAP   . Ovarian cyst    1999; surgically removed  . 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 Seizures ([place])    "as a child" (08/03/2016)  . Termination of pregnancy    due to cardiac risk    Tobacco Use: History  Smoking Status  . Never Smoker  Smokeless Tobacco  . Never Used    Labs: Recent Review Flowsheet Data    Labs for ITP Cardiac and Pulmonary Rehab Latest Ref Rng & Units 10/15/2014 11/09/2015 11/11/2015 11/11/2015 05/28/2016   Cholestrol 0 - 200 mg/dL 145 160 - - -   LDLCALC 0 - 99 mg/dL 85 88 - - -   HDL >40 mg/dL 46.50 58 - - -   Trlycerides <150 mg/dL 70.0 70 - - -   Hemoglobin A1c 4.6 - 6.5 % - 5.4 - - 5.3   PHART 7.350 - 7.450 - - - 7.372 -   PCO2ART 35.0 - 45.0 mmHg - - - 43.0 -   HCO3 20.0 - 24.0 mEq/L - - 26.7(H) 25.0(H) -   TCO2 0 - 100 mmol/L - - 28 26 -   O2SAT % - - 62.0 90.0 -      Capillary Blood Glucose: Lab Results  Component Value Date   GLUCAP 85 10/12/2016   GLUCAP 97 10/02/2016   GLUCAP 112 (H) 09/23/2016   GLUCAP 107 (H) 09/16/2016   GLUCAP 105 (H) 09/16/2016     Exercise Target Goals:    Exercise Program Goal: Individual exercise prescription set with THRR, safety & activity barriers. Participant demonstrates ability to understand and report RPE using BORG scale, to self-measure pulse accurately, and to acknowledge the importance of the exercise prescription.  Exercise Prescription Goal: Starting with aerobic activity 30 plus minutes a day, 3 days per week for initial exercise prescription. Provide  home exercise prescription and guidelines that participant acknowledges understanding prior to discharge.  Activity Barriers & Risk Stratification:     Activity Barriers & Cardiac Risk Stratification - 09/10/16 1134      Activity Barriers & Cardiac Risk Stratification   Activity Barriers Deconditioning;Muscular Weakness;Other (comment);Incisional Pain   Comments R knee pain and R shoulder bursitis   Cardiac Risk Stratification High  6 Minute Walk:     6 Minute Walk    Row Name 09/10/16 1134 09/10/16 1138       6 Minute Walk   Phase Initial  -    Distance 1562 feet  -    Walk Time 6 minutes  -    # of Rest Breaks 0  -    MPH  - 2.96    METS  - 4.65    RPE 11  -    VO2 Peak  - 16.27    Symptoms No  -    Resting HR 97 bpm  -    Resting BP 133/86  -    Max Ex. HR 127 bpm  -    Max Ex. BP 153/96  -    2 Minute Post BP 139/97  -       Oxygen Initial Assessment:   Oxygen Re-Evaluation:   Oxygen Discharge (Final Oxygen Re-Evaluation):   Initial Exercise Prescription:     Initial Exercise Prescription - 09/10/16 1200      Date of Initial Exercise RX and Referring Provider   Date 09/10/16   Referring Provider Glori Bickers MD     Treadmill   MPH 2.4   Grade 1   Minutes 10   METs 3.08     Recumbant Bike   Level 2   Minutes 10   METs 2     NuStep   Level 3   SPM 70   Minutes 10   METs 2     Intensity   THRR 40-80% of Max Heartrate 71-142   Ratings of Perceived Exertion 11-13   Perceived Dyspnea 0-4     Progression   Progression Continue to progress workloads to maintain intensity without signs/symptoms of physical distress.     Resistance Training   Training Prescription Yes   Weight 1lb   Reps 10-15      Perform Capillary Blood Glucose checks as needed.  Exercise Prescription Changes:     Exercise Prescription Changes    Row Name 09/16/16 1600 09/30/16 1100 10/13/16 1500         Response to Exercise   Blood Pressure  (Admit) 126/80 136/85 110/70     Blood Pressure (Exercise) 104/76 116/80 140/80     Blood Pressure (Exit) 112/62 103/65 114/70     Heart Rate (Admit) 91 bpm 92 bpm 99 bpm     Heart Rate (Exercise) 129 bpm 132 bpm 142 bpm     Heart Rate (Exit) 100 bpm 98 bpm 97 bpm     Rating of Perceived Exertion (Exercise) 13 13 15      Comments pt was oriented to exercise equipment and responded well to exercise pt was oriented to exercise equipment and responded well to exercise  -     Duration Continue with 30 min of aerobic exercise without signs/symptoms of physical distress. Continue with 30 min of aerobic exercise without signs/symptoms of physical distress. Continue with 30 min of aerobic exercise without signs/symptoms of physical distress.     Intensity THRR unchanged THRR unchanged THRR unchanged       Progression   Progression Continue to progress workloads to maintain intensity without signs/symptoms of physical distress. Continue to progress workloads to maintain intensity without signs/symptoms of physical distress. Continue to progress workloads to maintain intensity without signs/symptoms of physical distress.     Average METs  - 3.3 2.2       Resistance Training   Training Prescription  Yes Yes Yes     Weight 1lbs 3lbs 3lbs     Reps 10-15 10-15 10-15     Time 10 Minutes 10 Minutes 10 Minutes       Treadmill   MPH 2.4  -  -     Grade 1  -  -     Minutes 10  -  -     METs 3.08  -  -       Recumbant Bike   Level 2 2.5 3.5     Minutes 10 15 15      METs 2 4 1.7       NuStep   Level 3 4 4      SPM 80 90 90     Minutes 10 15 15      METs 2 2.6 2.5       Home Exercise Plan   Plans to continue exercise at  - Home (comment)  walking Home (comment)  walking     Frequency  - Add 2 additional days to program exercise sessions. Add 2 additional days to program exercise sessions.     Initial Home Exercises Provided  - 09/25/16 09/25/16        Exercise Comments:     Exercise Comments     Row Name 09/16/16 1612 10/13/16 1519         Exercise Comments Pt completed first session of cardiac rehab today. Pt exercise for minutes without signs/symptoms of CP, dizziness and SOB Reviewed METs and goals. Pt is tolerating exercise very well; will continue to monitor exercise progression.         Exercise Goals and Review:     Exercise Goals    Row Name 09/10/16 7371 09/10/16 1156           Exercise Goals   Increase Physical Activity (P)  Yes Yes      Intervention (P)  Provide advice, education, support and counseling about physical activity/exercise needs.;Develop an individualized exercise prescription for aerobic and resistive training based on initial evaluation findings, risk stratification, comorbidities and participant's personal goals. Provide advice, education, support and counseling about physical activity/exercise needs.;Develop an individualized exercise prescription for aerobic and resistive training based on initial evaluation findings, risk stratification, comorbidities and participant's personal goals.      Expected Outcomes (P)  Achievement of increased cardiorespiratory fitness and enhanced flexibility, muscular endurance and strength shown through measurements of functional capacity and personal statement of participant. Achievement of increased cardiorespiratory fitness and enhanced flexibility, muscular endurance and strength shown through measurements of functional capacity and personal statement of participant.      Increase Strength and Stamina (P)  Yes Yes      Intervention (P)  Develop an individualized exercise prescription for aerobic and resistive training based on initial evaluation findings, risk stratification, comorbidities and participant's personal goals.;Provide advice, education, support and counseling about physical activity/exercise needs. Provide advice, education, support and counseling about physical activity/exercise needs.;Develop an  individualized exercise prescription for aerobic and resistive training based on initial evaluation findings, risk stratification, comorbidities and participant's personal goals.      Expected Outcomes (P)  Achievement of increased cardiorespiratory fitness and enhanced flexibility, muscular endurance and strength shown through measurements of functional capacity and personal statement of participant. Achievement of increased cardiorespiratory fitness and enhanced flexibility, muscular endurance and strength shown through measurements of functional capacity and personal statement of participant.         Exercise Goals Re-Evaluation :  Exercise Goals Re-Evaluation    [place] Name 09/25/16 1602 10/13/16 1519           Exercise Goal Re-Evaluation   Exercise Goals Review Increase Physical Activity;Increase Strenth and Stamina Increase Physical Activity;Increase Strenth and Stamina      Comments Reviewed home exercise with pt today.  Pt plans to walk for exercise. Encouraged 150 minutes of aerobic activities per week. Pt plans to walk 2 additional days per week outside of coming to cardiac rehab. Reviewed THR, pulse, RPE, sign and symptoms, and when to call 911 or MD.  Also discussed weather considerations and indoor options.  Pt voiced understanding. Pt is able to climb stairs at home without SOB or fatigue      Expected Outcomes Pt will be compliant with HEP and be able to exercise without difficulty. Pt will continue to improve in aerobic and functional fitness          Discharge Exercise Prescription (Final Exercise Prescription Changes):     Exercise Prescription Changes - 10/13/16 1500      Response to Exercise   Blood Pressure (Admit) 110/70   Blood Pressure (Exercise) 140/80   Blood Pressure (Exit) 114/70   Heart Rate (Admit) 99 bpm   Heart Rate (Exercise) 142 bpm   Heart Rate (Exit) 97 bpm   Rating of Perceived Exertion (Exercise) 15   Duration Continue with 30 min of aerobic  exercise without signs/symptoms of physical distress.   Intensity THRR unchanged     Progression   Progression Continue to progress workloads to maintain intensity without signs/symptoms of physical distress.   Average METs 2.2     Resistance Training   Training Prescription Yes   Weight 3lbs   Reps 10-15   Time 10 Minutes     Recumbant Bike   Level 3.5   Minutes 15   METs 1.7     NuStep   Level 4   SPM 90   Minutes 15   METs 2.5     Home Exercise Plan   Plans to continue exercise at Home (comment)  walking   Frequency Add 2 additional days to program exercise sessions.   Initial Home Exercises Provided 09/25/16      Nutrition:  Target Goals: Understanding of nutrition guidelines, daily intake of sodium 1500mg , cholesterol 200mg , calories 30% from fat and 7% or less from saturated fats, daily to have 5 or more servings of fruits and vegetables.  Biometrics:     Pre Biometrics - 09/10/16 1139      Pre Biometrics   Waist Circumference 43 inches   Hip Circumference 51 inches   Waist to Hip Ratio 0.84 %   Triceps Skinfold 58 mm   % Body Fat 50 %   Grip Strength 36 kg   Flexibility 14 in   Single Leg Stand 17.75 seconds       Nutrition Therapy Plan and Nutrition Goals:   Nutrition Discharge: Nutrition Scores:   Nutrition Goals Re-Evaluation:   Nutrition Goals Re-Evaluation:   Nutrition Goals Discharge (Final Nutrition Goals Re-Evaluation):   Psychosocial: Target Goals: Acknowledge presence or absence of significant depression and/or stress, maximize coping skills, provide positive support system. Participant is able to verbalize types and ability to use techniques and skills needed for reducing stress and depression.  Initial Review & Psychosocial Screening:     Initial Psych Review & Screening - 09/10/16 1605      Initial Review   Current issues with Current Stress Concerns;Current Anxiety/Panic  Source of Stress Concerns Chronic  Illness;Unable to participate in former interests or hobbies;Family   Comments Pt has young daughter and wonders with her current health issues what the future will be.     Family Dynamics   Good Support System? Yes     Barriers   Psychosocial barriers to participate in program The patient should benefit from training in stress management and relaxation.     Screening Interventions   Interventions Encouraged to exercise      Quality of Life Scores:     Quality of Life - 09/23/16 1631      Quality of Life Scores   Health/Function Pre 5.77 %  pt QOL severely altered by her physical disablity and social isolation from recent cardiac events.  health related anxiety and pt symptoms make it difficult for her to engage in day to day activities. pt is encouraged to participate in CR to decrease sx.   Socioeconomic Pre 16.86 %   Psych/Spiritual Pre 9.57 %   Family Pre 26.1 %   GLOBAL Pre 11.82 %  pt admits to feelings of helplessness and hopelessness. pt denies suicidal tendancies and reports improvement in her perspective and outlook recently.  pt is currenlty under psychiatric care seeking new counselor. list of area counselors provided to her       PHQ-9: Recent Review Flowsheet Data    Depression screen [facility] 2/9 09/16/2016 02/16/2015 12/27/2013   Decreased Interest 2 0 3   Down, Depressed, Hopeless 2 0 3   PHQ - 2 Score 4 0 6   Altered sleeping 2 - 1   Tired, decreased energy 2 - 1   Change in appetite 2 - 0   Feeling bad or failure about yourself  2 - 3   Trouble concentrating 2 - 3   Moving slowly or fidgety/restless 0 - 3   Suicidal thoughts 0 - 0   PHQ-9 Score 14 - 17   Difficult doing work/chores Very difficult - -     Interpretation of Total Score  Total Score Depression Severity:  1-4 = Minimal depression, 5-9 = Mild depression, 10-14 = Moderate depression, 15-19 = Moderately severe depression, 20-27 = Severe depression   Psychosocial Evaluation and Intervention:      Psychosocial Evaluation - 09/16/16 1642      Psychosocial Evaluation & Interventions   Interventions Therapist referral;Physician referral;Stress management education;Relaxation education;Encouraged to exercise with the program and follow exercise prescription   Comments pt is currently being treated by psychiatrist however she is looking additional counseling. offered to schedule appt, however she would like to discuss this possiblity with her psychiatrist first.     Expected Outcomes pt will continue current depression/ anxiety treatment to develop effective coping skills and positive outlook.    Continue Psychosocial Services  Follow up required by staff      Psychosocial Re-Evaluation:     Psychosocial Re-Evaluation    [place] Name 10/09/16 1104             Psychosocial Re-Evaluation   Current issues with Current Depression;Current Anxiety/Panic;Current Stress Concerns       Comments pt with known depression history and health related anxiety exhibits good coping skills and decreased depression symptoms.         Expected Outcomes pt will exhibit positive outlook with good coping skills.        Interventions Stress management education;Relaxation education;Encouraged to attend Cardiac Rehabilitation for the exercise;Therapist referral       Continue Psychosocial  Services  Follow up required by staff          Psychosocial Discharge (Final Psychosocial Re-Evaluation):     Psychosocial Re-Evaluation - 10/09/16 1104      Psychosocial Re-Evaluation   Current issues with Current Depression;Current Anxiety/Panic;Current Stress Concerns   Comments pt with known depression history and health related anxiety exhibits good coping skills and decreased depression symptoms.     Expected Outcomes pt will exhibit positive outlook with good coping skills.    Interventions Stress management education;Relaxation education;Encouraged to attend Cardiac Rehabilitation for the exercise;Therapist  referral   Continue Psychosocial Services  Follow up required by staff      Vocational Rehabilitation: Provide vocational rehab assistance to qualifying candidates.   Vocational Rehab Evaluation & Intervention:     Vocational Rehab - 09/10/16 1606      Initial Vocational Rehab Evaluation & Intervention   Assessment shows need for Vocational Rehabilitation No      Education: Education Goals: Education classes will be provided on a weekly basis, covering required topics. Participant will state understanding/return demonstration of topics presented.  Learning Barriers/Preferences:     Learning Barriers/Preferences - 09/10/16 8841      Learning Barriers/Preferences   Learning Barriers Sight   Learning Preferences Written Material      Education Topics: Count Your Pulse:  -Group instruction provided by verbal instruction, demonstration, patient participation and written materials to support subject.  Instructors address importance of being able to find your pulse and how to count your pulse when at home without a heart monitor.  Patients get hands on experience counting their pulse with staff help and individually.   CARDIAC REHAB PHASE II EXERCISE from 10/02/2016 in [place]  Date  09/25/16  Instruction Review Code  2- meets goals/outcomes      Heart Attack, Angina, and Risk Factor Modification:  -Group instruction provided by verbal instruction, video, and written materials to support subject.  Instructors address signs and symptoms of angina and heart attacks.    Also discuss risk factors for heart disease and how to make changes to improve heart health risk factors.   Functional Fitness:  -Group instruction provided by verbal instruction, demonstration, patient participation, and written materials to support subject.  Instructors address safety measures for doing things around the house.  Discuss how to get up and down off the floor, how  to pick things up properly, how to safely get out of a chair without assistance, and balance training.   Meditation and Mindfulness:  -Group instruction provided by verbal instruction, patient participation, and written materials to support subject.  Instructor addresses importance of mindfulness and meditation practice to help reduce stress and improve awareness.  Instructor also leads participants through a meditation exercise.    Stretching for Flexibility and Mobility:  -Group instruction provided by verbal instruction, patient participation, and written materials to support subject.  Instructors lead participants through series of stretches that are designed to increase flexibility thus improving mobility.  These stretches are additional exercise for major muscle groups that are typically performed during regular warm up and cool down.   Hands Only CPR:  -Group verbal, video, and participation provides a basic overview of AHA guidelines for community CPR. Role-play of emergencies allow participants the opportunity to practice calling for help and chest compression technique with discussion of AED use.   Hypertension: -Group verbal and written instruction that provides a basic overview of hypertension including the most recent diagnostic guidelines, risk  factor reduction with self-care instructions and medication management.    Nutrition I class: Heart Healthy Eating:  -Group instruction provided by PowerPoint slides, verbal discussion, and written materials to support subject matter. The instructor gives an explanation and review of the Therapeutic Lifestyle Changes diet recommendations, which includes a discussion on lipid goals, dietary fat, sodium, fiber, plant stanol/sterol esters, sugar, and the components of a well-balanced, healthy diet.   Nutrition II class: Lifestyle Skills:  -Group instruction provided by PowerPoint slides, verbal discussion, and written materials to support  subject matter. The instructor gives an explanation and review of label reading, grocery shopping for heart health, heart healthy recipe modifications, and ways to make healthier choices when eating out.   Diabetes Question & Answer:  -Group instruction provided by PowerPoint slides, verbal discussion, and written materials to support subject matter. The instructor gives an explanation and review of diabetes co-morbidities, pre- and post-prandial blood glucose goals, pre-exercise blood glucose goals, signs, symptoms, and treatment of hypoglycemia and hyperglycemia, and foot care basics.   CARDIAC REHAB PHASE II EXERCISE from 10/02/2016 in [place]  Date  09/25/16  Educator  RD  Instruction Review Code  2- meets goals/outcomes      Diabetes Blitz:  -Group instruction provided by PowerPoint slides, verbal discussion, and written materials to support subject matter. The instructor gives an explanation and review of the physiology behind type 1 and type 2 diabetes, diabetes medications and rational behind using different medications, pre- and post-prandial blood glucose recommendations and Hemoglobin A1c goals, diabetes diet, and exercise including blood glucose guidelines for exercising safely.    Portion Distortion:  -Group instruction provided by PowerPoint slides, verbal discussion, written materials, and food models to support subject matter. The instructor gives an explanation of serving size versus portion size, changes in portions sizes over the last 20 years, and what consists of a serving from each food group.   CARDIAC REHAB PHASE II EXERCISE from 10/02/2016 in [place]  Date  09/17/16  Educator  RD  Instruction Review Code  2- meets goals/outcomes      Stress Management:  -Group instruction provided by verbal instruction, video, and written materials to support subject matter.  Instructors review role of stress in  heart disease and how to cope with stress positively.     CARDIAC REHAB PHASE II EXERCISE from 10/02/2016 in [place]  Date  09/23/16  Instruction Review Code  2- meets goals/outcomes      Exercising on Your Own:  -Group instruction provided by verbal instruction, power point, and written materials to support subject.  Instructors discuss benefits of exercise, components of exercise, frequency and intensity of exercise, and end points for exercise.  Also discuss use of nitroglycerin and activating EMS.  Review options of places to exercise outside of rehab.  Review guidelines for sex with heart disease.   Cardiac Drugs I:  -Group instruction provided by verbal instruction and written materials to support subject.  Instructor reviews cardiac drug classes: antiplatelets, anticoagulants, beta blockers, and statins.  Instructor discusses reasons, side effects, and lifestyle considerations for each drug class.   Cardiac Drugs II:  -Group instruction provided by verbal instruction and written materials to support subject.  Instructor reviews cardiac drug classes: angiotensin converting enzyme inhibitors (ACE-I), angiotensin II receptor blockers (ARBs), nitrates, and calcium channel blockers.  Instructor discusses reasons, side effects, and lifestyle considerations for each drug class.   Anatomy and  Physiology of the Circulatory System:  Group verbal and written instruction and models provide basic cardiac anatomy and physiology, with the coronary electrical and arterial systems. Review of: AMI, Angina, Valve disease, Heart Failure, Peripheral Artery Disease, Cardiac Arrhythmia, Pacemakers, and the ICD.   Other Education:  -Group or individual verbal, written, or video instructions that support the educational goals of the cardiac rehab program.   Knowledge Questionnaire Score:     Knowledge Questionnaire Score - 09/10/16 1133      Knowledge Questionnaire  Score   Pre Score 23/24      Core Components/Risk Factors/Patient Goals at Admission:     Personal Goals and Risk Factors at Admission - 09/10/16 1139      Core Components/Risk Factors/Patient Goals on Admission    Weight Management Yes;Obesity;Weight Maintenance;Weight Loss   Intervention Weight Management/Obesity: Establish reasonable short term and long term weight goals.;Weight Management: Provide education and appropriate resources to help participant work on and attain dietary goals.;Weight Management: Develop a combined nutrition and exercise program designed to reach desired caloric intake, while maintaining appropriate intake of nutrient and fiber, sodium and fats, and appropriate energy expenditure required for the weight goal.;Obesity: Provide education and appropriate resources to help participant work on and attain dietary goals.   Expected Outcomes Short Term: Continue to assess and modify interventions until short term weight is achieved;Long Term: Adherence to nutrition and physical activity/exercise program aimed toward attainment of established weight goal;Weight Maintenance: Understanding of the daily nutrition guidelines, which includes 25-35% calories from fat, 7% or less cal from saturated fats, less than 200mg  cholesterol, less than 1.5gm of sodium, & 5 or more servings of fruits and vegetables daily;Weight Loss: Understanding of general recommendations for a balanced deficit meal plan, which promotes 1-2 lb weight loss per week and includes a negative energy balance of [phone] kcal/d;Understanding recommendations for meals to include 15-35% energy as protein, 25-35% energy from fat, 35-60% energy from carbohydrates, less than 200mg  of dietary cholesterol, 20-35 gm of total fiber daily;Understanding of distribution of calorie intake throughout the day with the consumption of 4-5 meals/snacks   Diabetes Yes   Intervention Provide education about signs/symptoms and action to take  for hypo/hyperglycemia.;Provide education about proper nutrition, including hydration, and aerobic/resistive exercise prescription along with prescribed medications to achieve blood glucose in normal ranges: Fasting glucose 65-99 mg/dL   Expected Outcomes Short Term: Participant verbalizes understanding of the signs/symptoms and immediate care of hyper/hypoglycemia, proper foot care and importance of medication, aerobic/resistive exercise and nutrition plan for blood glucose control.;Long Term: Attainment of HbA1C < 7%.   Heart Failure Yes   Intervention Provide a combined exercise and nutrition program that is supplemented with education, support and counseling about heart failure. Directed toward relieving symptoms such as shortness of breath, decreased exercise tolerance, and extremity edema.   Expected Outcomes Improve functional capacity of life;Short term: Attendance in program 2-3 days a week with increased exercise capacity. Reported lower sodium intake. Reported increased fruit and vegetable intake. Reports medication compliance.;Short term: Daily weights obtained and reported for increase. Utilizing diuretic protocols set by physician.;Long term: Adoption of self-care skills and reduction of barriers for early signs and symptoms recognition and intervention leading to self-care maintenance.   Hypertension Yes   Intervention Provide education on lifestyle modifcations including regular physical activity/exercise, weight management, moderate sodium restriction and increased consumption of fresh fruit, vegetables, and low fat dairy, alcohol moderation, and smoking cessation.;Monitor prescription use compliance.   Expected Outcomes Short Term: Continued assessment and  intervention until BP is < 140/46mm HG in hypertensive participants. < 130/38mm HG in hypertensive participants with diabetes, heart failure or chronic kidney disease.;Long Term: Maintenance of blood pressure at goal levels.   Stress Yes    Intervention Offer individual and/or small group education and counseling on adjustment to heart disease, stress management and health-related lifestyle change. Teach and support self-help strategies.;Refer participants experiencing significant psychosocial distress to appropriate mental health specialists for further evaluation and treatment. When possible, include family members and significant others in education/counseling sessions.   Expected Outcomes Short Term: Participant demonstrates changes in health-related behavior, relaxation and other stress management skills, ability to obtain effective social support, and compliance with psychotropic medications if prescribed.;Long Term: Emotional wellbeing is indicated by absence of clinically significant psychosocial distress or social isolation.      Core Components/Risk Factors/Patient Goals Review:      Goals and Risk Factor Review    Row Name 10/09/16 1101             Core Components/Risk Factors/Patient Goals Review   Personal Goals Review Weight Management/Obesity;Diabetes;Heart Failure;Hypertension;Stress       Review pt with multiple risk factors demonstrates eagerness to participate in CR activities.  pt is encouraged by her increased strength/stamina which allowed to participate in social outings. pt congratulated on this success.  pt demonstrates good diabetes, heart failure and hypertension self care practices.         Expected Outcomes t will participate in CR exercise, nutrition and lifestyle education opportunities to decrease overall CAD risk factors.           Core Components/Risk Factors/Patient Goals at Discharge (Final Review):      Goals and Risk Factor Review - 10/09/16 1101      Core Components/Risk Factors/Patient Goals Review   Personal Goals Review Weight Management/Obesity;Diabetes;Heart Failure;Hypertension;Stress   Review pt with multiple risk factors demonstrates eagerness to participate in CR  activities.  pt is encouraged by her increased strength/stamina which allowed to participate in social outings. pt congratulated on this success.  pt demonstrates good diabetes, heart failure and hypertension self care practices.     Expected Outcomes t will participate in CR exercise, nutrition and lifestyle education opportunities to decrease overall CAD risk factors.       ITP Comments:     ITP Comments    Row Name 09/10/16 1053           ITP Comments Medical Director, Dr. Fransico Him           Comments: Pt is making expected progress toward personal goals after completing  8 sessions. Recommend continued exercise and life style modification education including  stress management and relaxation techniques to decrease cardiac risk profile.

## 2016-10-13 NOTE — Telephone Encounter (Signed)
Called patient and asked her to send a manual transmission/sss

## 2016-10-14 ENCOUNTER — Encounter (HOSPITAL_COMMUNITY): Payer: BLUE CROSS/BLUE SHIELD

## 2016-10-14 ENCOUNTER — Encounter (HOSPITAL_COMMUNITY)
Admission: RE | Admit: 2016-10-14 | Discharge: 2016-10-14 | Disposition: A | Discharge status: Home / Self Care | Payer: BLUE CROSS/BLUE SHIELD | Source: Ambulatory Visit | Attending: Internal Medicine | Admitting: Internal Medicine

## 2016-10-14 DIAGNOSIS — I428 Other cardiomyopathies: Secondary | ICD-10-CM

## 2016-10-14 DIAGNOSIS — I5042 Chronic combined systolic (congestive) and diastolic (congestive) heart failure: Secondary | ICD-10-CM | POA: Diagnosis not present

## 2016-10-14 NOTE — Telephone Encounter (Signed)
Informed patient that remote was received and that everything was stable. Patient verbalized understanding.

## 2016-10-14 NOTE — Telephone Encounter (Signed)
LMTCB to inform patient that transmission was received and that information was sent to CHF clinic for review.

## 2016-10-14 NOTE — Telephone Encounter (Signed)
Remote transmission reviewed. Presenting rhythm: regular Vs, no PVCs present. No episodes recorded. Stable device function.   Will forward information to Jettie Booze, NP for review.

## 2016-10-15 NOTE — Progress Notes (Signed)
[name] [place], [place] [address] Phone: [phone] Subjective:    I'm seeing this patient by the request  of:    CC: Bilateral shoulder pain And bilateral knee pain  DHR:[id number]  [name] is a 41 y.o. female coming in with complaint of bilateral shoulder pain. 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 Has been 10 months since we seen patient. Patient states Starting have increasing pain. Seems to be more of the neck. Significant increased stressors we. Did have a ICD placed recently. Has responded osteopathic manipulative.    bilateral knee pain. Patient was in an is doing things in cardiac rehabilitation. Patient though states that unfortunately is having anterior knee pain. Seems to move laterally. Patient states that he can be significantly sore than she does not want to do any walking the next day. Patient denies any swelling. Patient states though that it is more of a dull aching sensation. Sometimes seems to hurt.   Past Medical History:  Diagnosis Date  . AICD (automatic cardioverter/defibrillator) present   . Anxiety   . Arthritis    right shoulder   . Asthma   . CHF (congestive heart failure) ([place])   . Depression   . Diabetes mellitus without complication ([place])   . Dyspnea    comes and goes intermittently mostly with exertion   . Fibroid    age 60  . Gallstones   . Hypertension   . IBS (irritable bowel syndrome)   . Migraine    "monthly" (08/03/2016)  . OSA on CPAP   . Ovarian cyst    1999; surgically removed  . 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 Seizures ([place])    "as a child" (08/03/2016)  . Termination of pregnancy    due to cardiac risk   Past Surgical History:  Procedure Laterality Date  . CARDIAC CATHETERIZATION N/A 11/11/2015   Procedure: Right/Left Heart Cath and Coronary Angiography;  Surgeon: Troy Sine, MD;  Location: [facility];  Service: Cardiovascular;  Laterality: N/A;  . CARDIAC CATHETERIZATION  ~ 2015  . CARDIAC DEFIBRILLATOR PLACEMENT  08/03/2016  . CESAREAN SECTION  1999  . COLONOSCOPY WITH PROPOFOL N/A 04/21/2016   Procedure: COLONOSCOPY WITH PROPOFOL;  Surgeon: Jerene Bears, MD;  Location: [facility];  Service: Gastroenterology;  Laterality: N/A;  . ESOPHAGOGASTRODUODENOSCOPY (EGD) WITH PROPOFOL N/A 04/21/2016   Procedure: ESOPHAGOGASTRODUODENOSCOPY (EGD) WITH PROPOFOL;  Surgeon: Jerene Bears, MD;  Location: [facility];  Service: Gastroenterology;  Laterality: N/A;  . FOOT FRACTURE SURGERY Right ~ 2003  . FRACTURE SURGERY    . ICD IMPLANT N/A 08/03/2016   Procedure: ICD Implant;  Surgeon: Deboraha Sprang, MD;  Location: [facility];  Service: Cardiovascular;  Laterality: N/A;  . LAPAROSCOPIC CHOLECYSTECTOMY  12/2006  . LAPAROSCOPY ABDOMEN DIAGNOSTIC  2008   "cut bile duct w/gallbladder OR; had to go in later & fix leak; hospitalized for 2 months"  . LEFT HEART CATHETERIZATION WITH CORONARY ANGIOGRAM N/A 02/26/2014   Procedure: LEFT HEART CATHETERIZATION WITH CORONARY ANGIOGRAM;  Surgeon: Jettie Booze, MD;  Location: [facility];  Service: Cardiovascular;  Laterality: N/A;  . OVARIAN CYST REMOVAL Right 1999  . TUBAL LIGATION  1999   Social History   Social History  . Marital status: Married    Spouse name: N/A  . Number of children: 1  .  Years of education: N/A   Occupational History  . stay at home mom    Social History Main Topics  . Smoking status: Never Smoker  . Smokeless tobacco: Never Used  . Alcohol use No  . Drug use: No  . Sexual activity: Yes   Other Topics Concern  . None   Social History Narrative  . None   Allergies  Allergen Reactions  . Vancomycin Other (See Comments)    "did something to my kidneys"; went into kidney failure  . Contrast Media [Iodinated Diagnostic Agents] Other (See Comments)    Multiple CT  contrast studies done over 2 weeks causing ARF  . Aspirin Other (See Comments)    Wheezing; but patient still takes if she needs to  . Ciprofloxacin Itching and Rash  . Sulfa Antibiotics Itching and Rash   Family History  Problem Relation Age of Onset  . Emphysema Maternal Grandmother        smoked  . Heart disease Maternal Grandmother 76       MI  . Rheum arthritis Mother   . Allergies Daughter   . Colon cancer Neg Hx     Past medical history, social, surgical and family history all reviewed in electronic medical record.  No pertanent information unless stated regarding to the chief complaint.   Review of Systems:Review of systems updated and as accurate as of 10/16/16  No headache, visual changes, nausea, vomiting, diarrhea, constipation, dizziness, abdominal pain, skin rash, fevers, chills, night sweats, weight loss, swollen lymph nodes,  chest pain, shortness of breath, mood changes. Positive muscle aches, body aches  Objective  Blood pressure 130/84, pulse (!) 52, height 5\' 5"  (1.651 m), weight 230 lb (104.3 kg), SpO2 96 %. Systems examined below as of 10/16/16   General: No apparent distress alert and oriented x3 mood and affect normal, dressed appropriately.  HEENT: Pupils equal, extraocular movements intact  Respiratory: [name] speak in full sentences and does not appear short of breath  Cardiovascular: No lower extremity edema, non tender, no erythema  Skin: Warm dry intact with no signs of infection or rash on extremities or on axial skeleton.  Abdomen: Soft nontender  Neuro: Cranial nerves II through XII are intact, neurovascularly intact in all extremities with 2+ DTRs and 2+ pulses.  Lymph: No lymphadenopathy of posterior or anterior cervical chain or axillae bilaterally.  Gait normal with good balance and coordination.  MSK:  Non tender with full range of motion and good stability and symmetric strength and tone of elbows, wrist, hip, and ankles bilaterally.    Knee: Bilateral Mild lateral tilt and lateral subluxation  TTP on superior lateral pfj  ROM full in flexion and extension and lower leg rotation. Ligaments with solid consistent endpoints including ACL, PCL, LCL, MCL. Negative Mcmurray's, Apley's, and Thessalonian tests. painful patellar compression. Patellar glide with crepitus. Patellar and quadriceps tendons unremarkable. Hamstring and quadriceps strength is normal.    Shoulder:Bilateral Inspection reveals no abnormalities, atrophy or asymmetry. Palpation is normal with no tenderness over AC joint or bicipital groove. ROM is full in all planes. Rotator cuff strength normal throughout. No signs of impingement with negative Neer and Hawkin's tests, empty can sign. Speeds and Yergason's tests normal. No labral pathology noted with negative Obrien's, negative clunk and good stability. Mild scapular dyskinesia noted No painful arc and no drop arm sign. No apprehension sign  Neck: Inspection unremarkable. No palpable stepoffs. Negative Spurling's maneuver. Mild limitation in extension. Significant tightness of the musculature bilaterally.  Grip strength and sensation normal in bilateral hands Strength good C4 to T1 distribution No sensory change to C4 to T1 Negative Hoffman sign bilaterally Reflexes normal  Back Exam:  Inspection: Unremarkable  Motion: Flexion 40 deg, Extension 25 deg, Side Bending to 40 deg bilaterally,  Rotation to 30 deg bilaterally  SLR laying: Negative  XSLR laying: Negative  Palpable tenderness: Tender to palpation in the paraspinal musculature diffusely. FABER: Tightness bilaterally. Sensory change: Gross sensation intact to all lumbar and sacral dermatomes.  Reflexes: 2+ at both patellar tendons, 2+ at achilles tendons, Babinski's downgoing.  Strength at foot  Plantar-flexion: 5/5 Dorsi-flexion: 5/5 Eversion: 5/5 Inversion: 5/5  Leg strength  Quad: 5/5 Hamstring: 5/5 Hip flexor: 5/5 Hip  abductors: 5/5  Gait unremarkable.  Osteopathic findings C3 flexed rotated and side bent right C6 flexed rotated and side bent left T3 extended rotated and side bent right inhaled third rib T8 extended rotated and side bent left L3 flexed rotated and side bent right Sacrum right on right      Impression and Recommendations:     This case required medical decision making of moderate complexity.      Note: This dictation was prepared with Dragon dictation along with smaller phrase technology. Any transcriptional errors that result from this process are unintentional.

## 2016-10-16 ENCOUNTER — Encounter (HOSPITAL_COMMUNITY)
Admission: RE | Admit: 2016-10-16 | Discharge: 2016-10-16 | Disposition: A | Discharge status: Home / Self Care | Payer: BLUE CROSS/BLUE SHIELD | Source: Ambulatory Visit | Attending: Internal Medicine | Admitting: Internal Medicine

## 2016-10-16 ENCOUNTER — Encounter: Payer: Self-pay | Admitting: Family Medicine

## 2016-10-16 ENCOUNTER — Encounter (HOSPITAL_COMMUNITY): Payer: BLUE CROSS/BLUE SHIELD

## 2016-10-16 ENCOUNTER — Ambulatory Visit (INDEPENDENT_AMBULATORY_CARE_PROVIDER_SITE_OTHER): Payer: BLUE CROSS/BLUE SHIELD | Admitting: Family Medicine

## 2016-10-16 ENCOUNTER — Encounter: Payer: Self-pay | Admitting: *Deleted

## 2016-10-16 VITALS — BP 130/84 | HR 52 | Ht 65.0 in | Wt 230.0 lb

## 2016-10-16 DIAGNOSIS — I428 Other cardiomyopathies: Secondary | ICD-10-CM

## 2016-10-16 DIAGNOSIS — M999 Biomechanical lesion, unspecified: Secondary | ICD-10-CM | POA: Diagnosis not present

## 2016-10-16 DIAGNOSIS — G8929 Other chronic pain: Secondary | ICD-10-CM | POA: Diagnosis not present

## 2016-10-16 DIAGNOSIS — I5042 Chronic combined systolic (congestive) and diastolic (congestive) heart failure: Secondary | ICD-10-CM | POA: Diagnosis not present

## 2016-10-16 DIAGNOSIS — M222X2 Patellofemoral disorders, left knee: Secondary | ICD-10-CM

## 2016-10-16 DIAGNOSIS — M222X1 Patellofemoral disorders, right knee: Secondary | ICD-10-CM | POA: Insufficient documentation

## 2016-10-16 DIAGNOSIS — M546 Pain in thoracic spine: Secondary | ICD-10-CM | POA: Diagnosis not present

## 2016-10-16 HISTORY — DX: Patellofemoral disorders, right knee: M22.2X1

## 2016-10-16 MED ORDER — VITAMIN D (ERGOCALCIFEROL) 1.25 MG (50000 UNIT) PO CAPS: 50000.0000 [IU] | ORAL_CAPSULE | ORAL | 0 refills | Status: DC

## 2016-10-16 NOTE — Assessment & Plan Note (Signed)
Decision today to treat with OMT was based on Physical Exam  After verbal consent patient was treated with HVLA, ME, FPR techniques in cervical, thoracic, rib, lumbar and sacral areas  Patient tolerated the procedure well with improvement in symptoms  Patient given exercises, stretches and lifestyle modifications  See medications in patient instructions if given  Patient will follow up in 4 weeks 

## 2016-10-16 NOTE — Assessment & Plan Note (Signed)
Patellofemoral Syndrome  Reviewed anatomy using anatomical model and how PFS occurs.  Given rehab exercises handout for VMO, hip abductors, core, entire kinetic chain including proprioception exercises including cone touches, step downs, hip elevations and turn outs.  Could benefit from PT, regular exercise, upright biking, and a PFS knee brace to assist with tracking abnormalities. RTC in 4 weeks. Brace given.

## 2016-10-16 NOTE — Assessment & Plan Note (Signed)
Secondary to muscle imbalances. We discussed again about ergonomics, posture, proper working environment. Patient with continue try be active. Follow-up with me again in 4 weeks. Did respond well to osteopathic manipulation.

## 2016-10-16 NOTE — Patient Instructions (Addendum)
Good to see you  I am glad you are doing well overall  Exercises 3 times a week.  Once weekly vitamin D for 12 weeks.  Over the counter Turmeric 500mg  daily  Tart cherry extract any dose at night Stay active See me again in 4-6 weeks

## 2016-10-21 ENCOUNTER — Encounter (HOSPITAL_COMMUNITY): Payer: BLUE CROSS/BLUE SHIELD

## 2016-10-21 ENCOUNTER — Encounter (HOSPITAL_COMMUNITY)
Admission: RE | Admit: 2016-10-21 | Discharge: 2016-10-21 | Disposition: A | Discharge status: Home / Self Care | Payer: BLUE CROSS/BLUE SHIELD | Source: Ambulatory Visit | Attending: Internal Medicine | Admitting: Internal Medicine

## 2016-10-21 ENCOUNTER — Telehealth (HOSPITAL_COMMUNITY): Payer: Self-pay | Admitting: *Deleted

## 2016-10-21 DIAGNOSIS — I428 Other cardiomyopathies: Secondary | ICD-10-CM

## 2016-10-21 NOTE — Progress Notes (Signed)
Pt arrived at cardiac rehab with 4.5kg weight gain over 5 days. Pt c/o generalized edema and fatigue more than usual. Denies dyspnea.  Pt reports she took flexeril 10mg  yesterday for back spasm. PC to AHF clinic to report. Jasmine spoke to pt via telephone and advised pt to take additional lasix 40mg .  Pt did not exercise today.  Pt instructed to notify office if homeweights elevate or unchanged or symptoms persist or worsen.  Understanding verbalized

## 2016-10-21 NOTE — Telephone Encounter (Signed)
Cardiac rehab called stating patients weight was up 8.8lbs in 5 days. Generalized edema hands,legs,face, and abdomen. Patient is more fatigued. Lungs clear.  Per AMy patient should take an extra 40mg  of Lasix for two days and call us back and let us know how she feels.  Patient is aware and agreeable.

## 2016-10-23 ENCOUNTER — Encounter (HOSPITAL_COMMUNITY): Payer: BLUE CROSS/BLUE SHIELD

## 2016-10-26 ENCOUNTER — Encounter (HOSPITAL_COMMUNITY): Payer: BLUE CROSS/BLUE SHIELD

## 2016-10-27 ENCOUNTER — Ambulatory Visit: Payer: Self-pay | Admitting: Internal Medicine

## 2016-10-28 ENCOUNTER — Encounter (HOSPITAL_COMMUNITY): Payer: BLUE CROSS/BLUE SHIELD

## 2016-10-28 ENCOUNTER — Ambulatory Visit: Payer: BLUE CROSS/BLUE SHIELD | Admitting: Internal Medicine

## 2016-10-28 ENCOUNTER — Telehealth (HOSPITAL_COMMUNITY): Payer: Self-pay | Admitting: Family

## 2016-10-29 ENCOUNTER — Encounter: Payer: Self-pay | Admitting: Family

## 2016-10-29 ENCOUNTER — Ambulatory Visit (INDEPENDENT_AMBULATORY_CARE_PROVIDER_SITE_OTHER): Payer: BLUE CROSS/BLUE SHIELD | Admitting: Family

## 2016-10-29 VITALS — BP 124/88 | HR 98 | Temp 98.9°F | Resp 18 | Ht 65.0 in | Wt 237.8 lb

## 2016-10-29 DIAGNOSIS — J069 Acute upper respiratory infection, unspecified: Secondary | ICD-10-CM | POA: Diagnosis not present

## 2016-10-29 MED ORDER — PROMETHAZINE-DM 6.25-15 MG/5ML PO SYRP: 5.0000 mL | ORAL_SOLUTION | Freq: Four times a day (QID) | ORAL | 0 refills | Status: DC | PRN

## 2016-10-29 MED ORDER — CEFUROXIME AXETIL 500 MG PO TABS
500.0000 mg | ORAL_TABLET | Freq: Two times a day (BID) | ORAL | 0 refills | Status: DC
Start: 2016-10-29 — End: 2016-12-15

## 2016-10-29 NOTE — Assessment & Plan Note (Signed)
Symptoms and exam consistent with acute upper respiratory infection. Encouraged watchful waiting for the next 24-72 hours this may still be viral. Start promethazine-DM as needed for cough and sleep. Written prescription for Ceftin provided if symptoms worsen or do not improve. Continue over-the-counter medications as needed for symptom relief and supportive care.

## 2016-10-29 NOTE — Progress Notes (Signed)
Subjective:    Patient ID: [name], female    DOB: Jul 14, 1975, 41 y.o.   MRN: [id number]  Chief Complaint  Patient presents with  . Cough    right ear pain, cough, sore troat, congestion, x3 days    HPI:  [name] is a 41 y.o. female who  has a past medical history of AICD (automatic cardioverter/defibrillator) present; Anxiety; Arthritis; Asthma; CHF (congestive heart failure) ([place]); Depression; Diabetes mellitus without complication ([place]); Dyspnea; Fibroid; Gallstones; Hypertension; IBS (irritable bowel syndrome); Migraine; OSA on CPAP; Ovarian cyst; Postpartum cardiomyopathy; Seizures ([place]); and Termination of pregnancy. and presents today for an acute office visit.  This is a new problem. Associated symptoms of right ear pain, sore throat, and congestion have been going on for 3-4 days. Slight fever at the onset but none in the past 48 hours. Modifying factors include Coricidin which did not help very much. Course of the symptoms has stayed about the same since initial onset. No sick contacts.     Allergies  Allergen Reactions  . Vancomycin Other (See Comments)    "did something to my kidneys"; went into kidney failure  . Contrast Media [Iodinated Diagnostic Agents] Other (See Comments)    Multiple CT contrast studies done over 2 weeks causing ARF  . Aspirin Other (See Comments)    Wheezing; but patient still takes if she needs to  . Ciprofloxacin Itching and Rash  . Sulfa Antibiotics Itching and Rash      Outpatient Medications Prior to Visit  Medication Sig Dispense Refill  . albuterol (PROVENTIL HFA;VENTOLIN HFA) 108 (90 Base) MCG/ACT inhaler Inhale 2 puffs into the lungs every 6 (six) hours as needed for wheezing or shortness of breath. 1 Inhaler 0  . Armodafinil (NUVIGIL) 250 MG tablet Take 250 mg by mouth daily.    . budesonide-formoterol (SYMBICORT) 160-4.5 MCG/ACT inhaler Inhale 2 puffs into the lungs 2 (two) times daily. (Patient taking differently:  Inhale 2 puffs into the lungs 2 (two) times daily as needed (for shortness of breath). ) 1 Inhaler 3  . carvedilol (COREG) 25 MG tablet Take 1 tablet (25 mg total) by mouth 2 (two) times daily with a meal. 180 tablet 3  . colestipol (COLESTID) 1 g tablet Take 2 tablets (2 g total) by mouth daily. 60 tablet 3  . cyclobenzaprine (FLEXERIL) 10 MG tablet Take 1 tablet (10 mg total) by mouth 3 (three) times daily as needed for muscle spasms. 30 tablet 0  . Diclofenac Sodium 2 % SOLN Apply 1 pump twice daily. (Patient taking differently: Apply 1 application topically as needed (for pain). ) 112 g 3  . dicyclomine (BENTYL) 20 MG tablet Take 1 tablet (20 mg total) by mouth 2 (two) times daily with a meal. (Patient taking differently: Take 20 mg by mouth 2 (two) times daily as needed for spasms. ) 60 tablet 2  . fluticasone (FLONASE) 50 MCG/ACT nasal spray Place 2 sprays into both nostrils daily as needed for allergies. 16 g 2  . furosemide (LASIX) 80 MG tablet Take 1 tablet (80 mg total) by mouth 2 (two) times daily. 60 tablet 6  . gabapentin (NEURONTIN) 100 MG capsule Take 100 mg by mouth daily as needed (for pain).     . Insulin Pen Needle (NOVOFINE) 32G X 6 MM MISC Use to administer victoza every day 100 each 1  . Insulin Pen Needle 31G X 6 MM MISC Use to test blood sugars 1-2 times a day as  needed. 100 each 0  . ivabradine (CORLANOR) 5 MG TABS tablet Take 1.5 tablets (7.5 mg total) by mouth 2 (two) times daily with a meal. 90 tablet 3  . ondansetron (ZOFRAN ODT) 4 MG disintegrating tablet Take 1 tablet (4 mg total) by mouth every 8 (eight) hours as needed for nausea or vomiting. 20 tablet 0  . sacubitril-valsartan (ENTRESTO) 97-103 MG Take 1 tablet by mouth 2 (two) times daily. 60 tablet 11  . spironolactone (ALDACTONE) 25 MG tablet Take 1 tablet (25 mg total) by mouth daily. 90 tablet 3  . traZODone (DESYREL) 50 MG tablet Take 1 tablet (50 mg total) by mouth at bedtime as needed for sleep. 30 tablet 0    . UNABLE TO FIND CPAP    . venlafaxine XR (EFFEXOR-XR) 75 MG 24 hr capsule Take 225 mg by mouth daily with breakfast.    . VICTOZA 18 MG/3ML SOPN Inject 0.3 mLs (1.8 mg total) into the skin daily. 27 mL 0  . vitamin B-12 (CYANOCOBALAMIN) 500 MCG tablet Take 500 mcg by mouth daily.    . Vitamin D, Ergocalciferol, (DRISDOL) 50000 units CAPS capsule Take 1 capsule (50,000 Units total) by mouth every 7 (seven) days. 12 capsule 0  . potassium chloride (K-DUR) 10 MEQ tablet Take 1 tablet (10 mEq total) by mouth daily. 90 tablet 3   No facility-administered medications prior to visit.      Past Medical History:  Diagnosis Date  . AICD (automatic cardioverter/defibrillator) present   . Anxiety   . Arthritis    right shoulder   . Asthma   . CHF (congestive heart failure) ([place])   . Depression   . Diabetes mellitus without complication ([place])   . Dyspnea    comes and goes intermittently mostly with exertion   . Fibroid    age 54  . Gallstones   . Hypertension   . IBS (irritable bowel syndrome)   . Migraine    "monthly" (08/03/2016)  . OSA on CPAP   . Ovarian cyst    1999; surgically removed  . 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 Seizures ([place])    "as a child" (08/03/2016)  . Termination of pregnancy    due to cardiac risk      Review of Systems  Constitutional: Positive for fever. Negative for chills.  HENT: Positive for congestion, ear pain, postnasal drip, sinus pressure and sore throat. Negative for facial swelling.   Respiratory: Negative for chest tightness and shortness of breath.   Cardiovascular: Negative for chest pain, palpitations and leg swelling.  Gastrointestinal: Negative for vomiting.  Neurological: Negative for headaches.      Objective:    BP 124/88 (BP Location: Left Arm, Patient Position: Sitting, Cuff Size: Large)   Pulse 98   Temp 98.9 F (37.2 C) (Oral)   Resp 18   Ht 5\' 5"  (1.651 m)   Wt 237 lb 12.8 oz (107.9 kg)    SpO2 94%   BMI 39.57 kg/m  Nursing note and vital signs reviewed.  Physical Exam  Constitutional: She is oriented to person, place, and time. She appears well-developed and well-nourished. No distress.  HENT:  Right Ear: Hearing, tympanic membrane, external ear and ear canal normal.  Left Ear: Hearing, tympanic membrane, external ear and ear canal normal.  Nose: Nose normal. Right sinus exhibits no maxillary sinus tenderness and no frontal sinus tenderness. Left sinus exhibits no maxillary sinus tenderness and no frontal sinus tenderness.  Mouth/Throat: Uvula is midline and mucous membranes are normal. Oropharyngeal exudate present.  Cardiovascular: Normal rate, regular rhythm, normal heart sounds and intact distal pulses.   Pulmonary/Chest: Effort normal and breath sounds normal.  Neurological: She is alert and oriented to person, place, and time.  Skin: Skin is warm and dry.  Psychiatric: She has a normal mood and affect. Her behavior is normal. Judgment and thought content normal.       Assessment & Plan:   Problem List Items Addressed This Visit      Respiratory   Acute upper respiratory infection - Primary    Symptoms and exam consistent with acute upper respiratory infection. Encouraged watchful waiting for the next 24-72 hours this may still be viral. Start promethazine-DM as needed for cough and sleep. Written prescription for Ceftin provided if symptoms worsen or do not improve. Continue over-the-counter medications as needed for symptom relief and supportive care.      Relevant Medications   cefUROXime (CEFTIN) 500 MG tablet       I am having [name] start on cefUROXime and promethazine-dextromethorphan. I am also having her maintain her gabapentin, venlafaxine XR, NUVIGIL, spironolactone, Diclofenac Sodium, carvedilol, sacubitril-valsartan, colestipol, dicyclomine, budesonide-formoterol, UNABLE TO FIND, ondansetron, potassium chloride, vitamin B-12, ivabradine,  fluticasone, albuterol, VICTOZA, cyclobenzaprine, traZODone, furosemide, Insulin Pen Needle, Insulin Pen Needle, and Vitamin D (Ergocalciferol).   Meds ordered this encounter  Medications  . cefUROXime (CEFTIN) 500 MG tablet    Sig: Take 1 tablet (500 mg total) by mouth 2 (two) times daily with a meal.    Dispense:  14 tablet    Refill:  0    Order Specific Question:   Supervising Provider    Answer:   Pricilla Holm A [[id number]]  . promethazine-dextromethorphan (PROMETHAZINE-DM) 6.25-15 MG/5ML syrup    Sig: Take 5 mLs by mouth 4 (four) times daily as needed.    Dispense:  118 mL    Refill:  0    Order Specific Question:   Supervising Provider    Answer:   Pricilla Holm A [[id number]]     Follow-up: Return if symptoms worsen or fail to improve.  Mauricio Po, FNP

## 2016-10-29 NOTE — Patient Instructions (Addendum)
Thank you for choosing [facility].  SUMMARY AND INSTRUCTIONS:  Continue to take OTC medications as needed.  If you are feeling better hold off on antibiotic - continue to hold unless you get worse.    Medication:  Your prescription(s) have been submitted to your pharmacy or been printed and provided for you. Please take as directed and contact our office if you believe you are having problem(s) with the medication(s) or have any questions.  Follow up:  If your symptoms worsen or fail to improve, please contact our office for further instruction, or in case of emergency go directly to the emergency room at the closest medical facility.   General Recommendations:    Please drink plenty of fluids.  Get plenty of rest   Sleep in humidified air  Use saline nasal sprays  Netti pot   OTC Medications:  Decongestants - helps relieve congestion   Flonase (generic fluticasone) or Nasacort (generic triamcinolone) - please make sure to use the "cross-over" technique at a 45 degree angle towards the opposite eye as opposed to straight up the nasal passageway.   Sudafed (generic pseudoephedrine - Note this is the one that is available behind the pharmacy counter); Products with phenylephrine (-PE) may also be used but is often not as effective as pseudoephedrine.   If you have HIGH BLOOD PRESSURE - Coricidin HBP; AVOID any product that is -D as this contains pseudoephedrine which may increase your blood pressure.  Afrin (oxymetazoline) every 6-8 hours for up to 3 days.   Allergies - helps relieve runny nose, itchy eyes and sneezing   Claritin (generic loratidine), Allegra (fexofenidine), or Zyrtec (generic cyrterizine) for runny nose. These medications should not cause drowsiness.  Note - Benadryl (generic diphenhydramine) may be used however may cause drowsiness  Cough -   Delsym or Robitussin (generic dextromethorphan)  Expectorants - helps loosen mucus to ease  removal   Mucinex (generic guaifenesin) as directed on the package.  Headaches / General Aches   Tylenol (generic acetaminophen) - DO NOT EXCEED 3 grams (3,000 mg) in a 24 hour time period  Advil/Motrin (generic ibuprofen)   Sore Throat -   Salt water gargle   Chloraseptic (generic benzocaine) spray or lozenges / Sucrets (generic dyclonine)

## 2016-10-30 ENCOUNTER — Telehealth (HOSPITAL_COMMUNITY): Payer: Self-pay | Admitting: Family

## 2016-10-30 ENCOUNTER — Encounter (HOSPITAL_COMMUNITY): Payer: BLUE CROSS/BLUE SHIELD

## 2016-11-02 ENCOUNTER — Encounter (HOSPITAL_COMMUNITY): Payer: BLUE CROSS/BLUE SHIELD

## 2016-11-02 ENCOUNTER — Encounter (HOSPITAL_COMMUNITY)
Admission: RE | Admit: 2016-11-02 | Discharge: 2016-11-02 | Disposition: A | Discharge status: Home / Self Care | Payer: BLUE CROSS/BLUE SHIELD | Source: Ambulatory Visit | Attending: Internal Medicine | Admitting: Internal Medicine

## 2016-11-02 ENCOUNTER — Other Ambulatory Visit: Payer: Self-pay | Admitting: Family

## 2016-11-02 DIAGNOSIS — I5042 Chronic combined systolic (congestive) and diastolic (congestive) heart failure: Secondary | ICD-10-CM | POA: Diagnosis present

## 2016-11-02 DIAGNOSIS — I428 Other cardiomyopathies: Secondary | ICD-10-CM

## 2016-11-04 ENCOUNTER — Encounter: Payer: Self-pay | Admitting: Family

## 2016-11-04 ENCOUNTER — Encounter (HOSPITAL_COMMUNITY): Payer: BLUE CROSS/BLUE SHIELD

## 2016-11-06 ENCOUNTER — Encounter (HOSPITAL_COMMUNITY): Payer: BLUE CROSS/BLUE SHIELD

## 2016-11-06 ENCOUNTER — Encounter (HOSPITAL_COMMUNITY)
Admission: RE | Admit: 2016-11-06 | Discharge: 2016-11-06 | Disposition: A | Discharge status: Home / Self Care | Payer: BLUE CROSS/BLUE SHIELD | Source: Ambulatory Visit | Attending: Internal Medicine | Admitting: Internal Medicine

## 2016-11-06 DIAGNOSIS — I5042 Chronic combined systolic (congestive) and diastolic (congestive) heart failure: Secondary | ICD-10-CM | POA: Diagnosis not present

## 2016-11-06 DIAGNOSIS — I428 Other cardiomyopathies: Secondary | ICD-10-CM

## 2016-11-06 LAB — GLUCOSE, CAPILLARY: Glucose-Capillary: 113 mg/dL — ABNORMAL HIGH (ref 65–99)

## 2016-11-08 MED ORDER — HYDROCODONE-HOMATROPINE 5-1.5 MG/5ML PO SYRP: 5.0000 mL | ORAL_SOLUTION | Freq: Three times a day (TID) | ORAL | 0 refills | Status: DC | PRN

## 2016-11-09 ENCOUNTER — Encounter (HOSPITAL_COMMUNITY)
Admission: RE | Admit: 2016-11-09 | Discharge: 2016-11-09 | Disposition: A | Discharge status: Home / Self Care | Payer: BLUE CROSS/BLUE SHIELD | Source: Ambulatory Visit | Attending: Internal Medicine | Admitting: Internal Medicine

## 2016-11-09 ENCOUNTER — Encounter (HOSPITAL_COMMUNITY): Payer: BLUE CROSS/BLUE SHIELD

## 2016-11-09 DIAGNOSIS — I5042 Chronic combined systolic (congestive) and diastolic (congestive) heart failure: Secondary | ICD-10-CM | POA: Diagnosis not present

## 2016-11-09 DIAGNOSIS — I428 Other cardiomyopathies: Secondary | ICD-10-CM

## 2016-11-10 NOTE — Progress Notes (Signed)
Cardiac Individual Treatment Plan  Patient Details  Name: [name] MRN: [id number] Date of Birth: 05-30-1975 Referring Provider:     CARDIAC REHAB PHASE II ORIENTATION from 09/10/2016 in [facility]  Referring Provider  Glori Bickers MD      Initial Encounter Date:    CARDIAC REHAB PHASE II ORIENTATION from 09/10/2016 in [facility]  Date  09/10/16  Referring Provider  Glori Bickers MD      Visit Diagnosis: NICM (nonischemic cardiomyopathy) ([place])  [name] Home Medications on Admission:  Current Outpatient Prescriptions:  .  albuterol (PROVENTIL HFA;VENTOLIN HFA) 108 (90 Base) MCG/ACT inhaler, Inhale 2 puffs into the lungs every 6 (six) hours as needed for wheezing or shortness of breath., Disp: 1 Inhaler, Rfl: 0 .  Armodafinil (NUVIGIL) 250 MG tablet, Take 250 mg by mouth daily., Disp: , Rfl:  .  B-D ULTRAFINE III SHORT PEN 31G X 8 MM MISC, USE TO TEST BLOOD SUGARS 1-2 TIMES A DAY AS NEEDED., Disp: 100 each, Rfl: 0 .  budesonide-formoterol (SYMBICORT) 160-4.5 MCG/ACT inhaler, Inhale 2 puffs into the lungs 2 (two) times daily. (Patient taking differently: Inhale 2 puffs into the lungs 2 (two) times daily as needed (for shortness of breath). ), Disp: 1 Inhaler, Rfl: 3 .  carvedilol (COREG) 25 MG tablet, Take 1 tablet (25 mg total) by mouth 2 (two) times daily with a meal., Disp: 180 tablet, Rfl: 3 .  cefUROXime (CEFTIN) 500 MG tablet, Take 1 tablet (500 mg total) by mouth 2 (two) times daily with a meal., Disp: 14 tablet, Rfl: 0 .  colestipol (COLESTID) 1 g tablet, Take 2 tablets (2 g total) by mouth daily., Disp: 60 tablet, Rfl: 3 .  cyclobenzaprine (FLEXERIL) 10 MG tablet, Take 1 tablet (10 mg total) by mouth 3 (three) times daily as needed for muscle spasms., Disp: 30 tablet, Rfl: 0 .  Diclofenac Sodium 2 % SOLN, Apply 1 pump twice daily. (Patient taking differently: Apply 1 application topically as needed (for  pain). ), Disp: 112 g, Rfl: 3 .  dicyclomine (BENTYL) 20 MG tablet, Take 1 tablet (20 mg total) by mouth 2 (two) times daily with a meal. (Patient taking differently: Take 20 mg by mouth 2 (two) times daily as needed for spasms. ), Disp: 60 tablet, Rfl: 2 .  fluticasone (FLONASE) 50 MCG/ACT nasal spray, Place 2 sprays into both nostrils daily as needed for allergies., Disp: 16 g, Rfl: 2 .  furosemide (LASIX) 80 MG tablet, Take 1 tablet (80 mg total) by mouth 2 (two) times daily., Disp: 60 tablet, Rfl: 6 .  gabapentin (NEURONTIN) 100 MG capsule, Take 100 mg by mouth daily as needed (for pain). , Disp: , Rfl:  .  HYDROcodone-homatropine (HYCODAN) 5-1.5 MG/5ML syrup, Take 5 mLs by mouth every 8 (eight) hours as needed for cough., Disp: 120 mL, Rfl: 0 .  Insulin Pen Needle (NOVOFINE) 32G X 6 MM MISC, Use to administer victoza every day, Disp: 100 each, Rfl: 1 .  ivabradine (CORLANOR) 5 MG TABS tablet, Take 1.5 tablets (7.5 mg total) by mouth 2 (two) times daily with a meal., Disp: 90 tablet, Rfl: 3 .  ondansetron (ZOFRAN ODT) 4 MG disintegrating tablet, Take 1 tablet (4 mg total) by mouth every 8 (eight) hours as needed for nausea or vomiting., Disp: 20 tablet, Rfl: 0 .  potassium chloride (K-DUR) 10 MEQ tablet, Take 1 tablet (10 mEq total) by mouth daily., Disp: 90 tablet, Rfl:  3 .  promethazine-dextromethorphan (PROMETHAZINE-DM) 6.25-15 MG/5ML syrup, Take 5 mLs by mouth 4 (four) times daily as needed., Disp: 118 mL, Rfl: 0 .  sacubitril-valsartan (ENTRESTO) 97-103 MG, Take 1 tablet by mouth 2 (two) times daily., Disp: 60 tablet, Rfl: 11 .  spironolactone (ALDACTONE) 25 MG tablet, Take 1 tablet (25 mg total) by mouth daily., Disp: 90 tablet, Rfl: 3 .  traZODone (DESYREL) 50 MG tablet, Take 1 tablet (50 mg total) by mouth at bedtime as needed for sleep., Disp: 30 tablet, Rfl: 0 .  UNABLE TO FIND, CPAP, Disp: , Rfl:  .  venlafaxine XR (EFFEXOR-XR) 75 MG 24 hr capsule, Take 225 mg by mouth daily with  breakfast., Disp: , Rfl:  .  VICTOZA 18 MG/3ML SOPN, Inject 0.3 mLs (1.8 mg total) into the skin daily., Disp: 27 mL, Rfl: 0 .  vitamin B-12 (CYANOCOBALAMIN) 500 MCG tablet, Take 500 mcg by mouth daily., Disp: , Rfl:  .  Vitamin D, Ergocalciferol, (DRISDOL) 50000 units CAPS capsule, Take 1 capsule (50,000 Units total) by mouth every 7 (seven) days., Disp: 12 capsule, Rfl: 0  Past Medical History: Past Medical History:  Diagnosis Date  . AICD (automatic cardioverter/defibrillator) present   . Anxiety   . Arthritis    right shoulder   . Asthma   . CHF (congestive heart failure) ([place])   . Depression   . Diabetes mellitus without complication ([place])   . Dyspnea    comes and goes intermittently mostly with exertion   . Fibroid    age 37  . Gallstones   . Hypertension   . IBS (irritable bowel syndrome)   . Migraine    "monthly" (08/03/2016)  . OSA on CPAP   . Ovarian cyst    1999; surgically removed  . 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 Seizures ([place])    "as a child" (08/03/2016)  . Termination of pregnancy    due to cardiac risk    Tobacco Use: History  Smoking Status  . Never Smoker  Smokeless Tobacco  . Never Used    Labs: Recent Review Flowsheet Data    Labs for ITP Cardiac and Pulmonary Rehab Latest Ref Rng & Units 10/15/2014 11/09/2015 11/11/2015 11/11/2015 05/28/2016   Cholestrol 0 - 200 mg/dL 145 160 - - -   LDLCALC 0 - 99 mg/dL 85 88 - - -   HDL >40 mg/dL 46.50 58 - - -   Trlycerides <150 mg/dL 70.0 70 - - -   Hemoglobin A1c 4.6 - 6.5 % - 5.4 - - 5.3   PHART 7.350 - 7.450 - - - 7.372 -   PCO2ART 35.0 - 45.0 mmHg - - - 43.0 -   HCO3 20.0 - 24.0 mEq/L - - 26.7(H) 25.0(H) -   TCO2 0 - 100 mmol/L - - 28 26 -   O2SAT % - - 62.0 90.0 -      Capillary Blood Glucose: Lab Results  Component Value Date   GLUCAP 113 (H) 11/06/2016   GLUCAP 85 10/12/2016   GLUCAP 97 10/02/2016   GLUCAP 112 (H) 09/23/2016   GLUCAP 107 (H) 09/16/2016      Exercise Target Goals:    Exercise Program Goal: Individual exercise prescription set with THRR, safety & activity barriers. Participant demonstrates ability to understand and report RPE using BORG scale, to self-measure pulse accurately, and to acknowledge the importance of the exercise prescription.  Exercise Prescription Goal: Starting with aerobic activity 30 plus  minutes a day, 3 days per week for initial exercise prescription. Provide home exercise prescription and guidelines that participant acknowledges understanding prior to discharge.  Activity Barriers & Risk Stratification:     Activity Barriers & Cardiac Risk Stratification - 09/10/16 1134      Activity Barriers & Cardiac Risk Stratification   Activity Barriers Deconditioning;Muscular Weakness;Other (comment);Incisional Pain   Comments R knee pain and R shoulder bursitis   Cardiac Risk Stratification High      6 Minute Walk:     6 Minute Walk    Row Name 09/10/16 1134 09/10/16 1138       6 Minute Walk   Phase Initial  -    Distance 1562 feet  -    Walk Time 6 minutes  -    # of Rest Breaks 0  -    MPH  - 2.96    METS  - 4.65    RPE 11  -    VO2 Peak  - 16.27    Symptoms No  -    Resting HR 97 bpm  -    Resting BP 133/86  -    Max Ex. HR 127 bpm  -    Max Ex. BP 153/96  -    2 Minute Post BP 139/97  -       Oxygen Initial Assessment:   Oxygen Re-Evaluation:   Oxygen Discharge (Final Oxygen Re-Evaluation):   Initial Exercise Prescription:     Initial Exercise Prescription - 09/10/16 1200      Date of Initial Exercise RX and Referring Provider   Date 09/10/16   Referring Provider Glori Bickers MD     Treadmill   MPH 2.4   Grade 1   Minutes 10   METs 3.08     Recumbant Bike   Level 2   Minutes 10   METs 2     NuStep   Level 3   SPM 70   Minutes 10   METs 2     Intensity   THRR 40-80% of Max Heartrate 71-142   Ratings of Perceived Exertion 11-13   Perceived  Dyspnea 0-4     Progression   Progression Continue to progress workloads to maintain intensity without signs/symptoms of physical distress.     Resistance Training   Training Prescription Yes   Weight 1lb   Reps 10-15      Perform Capillary Blood Glucose checks as needed.  Exercise Prescription Changes:     Exercise Prescription Changes    Row Name 09/16/16 1600 09/30/16 1100 10/13/16 1500 11/09/16 1500       Response to Exercise   Blood Pressure (Admit) 126/80 136/85 110/70 118/60    Blood Pressure (Exercise) 104/76 116/80 140/80 144/74    Blood Pressure (Exit) 112/62 103/65 114/70 112/68    Heart Rate (Admit) 91 bpm 92 bpm 99 bpm 99 bpm    Heart Rate (Exercise) 129 bpm 132 bpm 142 bpm 130 bpm    Heart Rate (Exit) 100 bpm 98 bpm 97 bpm 109 bpm    Rating of Perceived Exertion (Exercise) 13 13 15 12     Symptoms  -  -  - pt reported R sided gluteal/piriformis pain    Comments pt was oriented to exercise equipment and responded well to exercise pt was oriented to exercise equipment and responded well to exercise  - Guided pt through stretches to help with gluteal pain/discomfort    Duration Continue with 30 min of  aerobic exercise without signs/symptoms of physical distress. Continue with 30 min of aerobic exercise without signs/symptoms of physical distress. Continue with 30 min of aerobic exercise without signs/symptoms of physical distress. Continue with 30 min of aerobic exercise without signs/symptoms of physical distress.    Intensity THRR unchanged THRR unchanged THRR unchanged THRR unchanged      Progression   Progression Continue to progress workloads to maintain intensity without signs/symptoms of physical distress. Continue to progress workloads to maintain intensity without signs/symptoms of physical distress. Continue to progress workloads to maintain intensity without signs/symptoms of physical distress. Continue to progress workloads to maintain intensity without  signs/symptoms of physical distress.    Average METs  - 3.3 2.2 2.5      Resistance Training   Training Prescription Yes Yes Yes Yes    Weight 1lbs 3lbs 3lbs 5lbs    Reps 10-15 10-15 10-15 10-15    Time 10 Minutes 10 Minutes 10 Minutes 10 Minutes      Treadmill   MPH 2.4  -  - 2.4    Grade 1  -  - 1    Minutes 10  -  - 15    METs 3.08  -  - 3.08      Recumbant Bike   Level 2 2.5 3.5  -    Minutes 10 15 15   -    METs 2 4 1.7  -      NuStep   Level 3 4 4 4     SPM 80 90 90 80  decr. SPM due to gluteal pain    Minutes 10 15 15 15     METs 2 2.6 2.5 1.9      Home Exercise Plan   Plans to continue exercise at  - Home (comment)  walking Home (comment)  walking Home (comment)  walking    Frequency  - Add 2 additional days to program exercise sessions. Add 2 additional days to program exercise sessions. Add 2 additional days to program exercise sessions.    Initial Home Exercises Provided  - 09/25/16 09/25/16 09/25/16       Exercise Comments:     Exercise Comments    Row Name 09/16/16 1612 10/13/16 1519 11/09/16 1520       Exercise Comments Pt completed first session of cardiac rehab today. Pt exercise for minutes without signs/symptoms of CP, dizziness and SOB Reviewed METs and goals. Pt is tolerating exercise very well; will continue to monitor exercise progression. Reviewed METs and goals. Pt is tolerating exercise very well; will continue to monitor exercise progression.        Exercise Goals and Review:     Exercise Goals    Row Name 09/10/16 6767 09/10/16 1156           Exercise Goals   Increase Physical Activity (P)  Yes Yes      Intervention (P)  Provide advice, education, support and counseling about physical activity/exercise needs.;Develop an individualized exercise prescription for aerobic and resistive training based on initial evaluation findings, risk stratification, comorbidities and participant's personal goals. Provide advice, education, support and  counseling about physical activity/exercise needs.;Develop an individualized exercise prescription for aerobic and resistive training based on initial evaluation findings, risk stratification, comorbidities and participant's personal goals.      Expected Outcomes (P)  Achievement of increased cardiorespiratory fitness and enhanced flexibility, muscular endurance and strength shown through measurements of functional capacity and personal statement of participant. Achievement of increased cardiorespiratory fitness and enhanced  flexibility, muscular endurance and strength shown through measurements of functional capacity and personal statement of participant.      Increase Strength and Stamina (P)  Yes Yes      Intervention (P)  Develop an individualized exercise prescription for aerobic and resistive training based on initial evaluation findings, risk stratification, comorbidities and participant's personal goals.;Provide advice, education, support and counseling about physical activity/exercise needs. Provide advice, education, support and counseling about physical activity/exercise needs.;Develop an individualized exercise prescription for aerobic and resistive training based on initial evaluation findings, risk stratification, comorbidities and participant's personal goals.      Expected Outcomes (P)  Achievement of increased cardiorespiratory fitness and enhanced flexibility, muscular endurance and strength shown through measurements of functional capacity and personal statement of participant. Achievement of increased cardiorespiratory fitness and enhanced flexibility, muscular endurance and strength shown through measurements of functional capacity and personal statement of participant.         Exercise Goals Re-Evaluation :     Exercise Goals Re-Evaluation    Row Name 09/25/16 1602 10/13/16 1519 11/09/16 1519         Exercise Goal Re-Evaluation   Exercise Goals Review Increase Physical  Activity;Increase Strenth and Stamina Increase Physical Activity;Increase Strenth and Stamina Increase Physical Activity;Increase Strenth and Stamina     Comments Reviewed home exercise with pt today.  Pt plans to walk for exercise. Encouraged 150 minutes of aerobic activities per week. Pt plans to walk 2 additional days per week outside of coming to cardiac rehab. Reviewed THR, pulse, RPE, sign and symptoms, and when to call 911 or MD.  Also discussed weather considerations and indoor options.  Pt voiced understanding. 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     Expected Outcomes Pt will be compliant with HEP and be able to exercise without difficulty. Pt will continue to improve in aerobic and functional fitness Pt will continue to improve in aerobic and functional fitness         Discharge Exercise Prescription (Final Exercise Prescription Changes):     Exercise Prescription Changes - 11/09/16 1500      Response to Exercise   Blood Pressure (Admit) 118/60   Blood Pressure (Exercise) 144/74   Blood Pressure (Exit) 112/68   Heart Rate (Admit) 99 bpm   Heart Rate (Exercise) 130 bpm   Heart Rate (Exit) 109 bpm   Rating of Perceived Exertion (Exercise) 12   Symptoms pt reported R sided gluteal/piriformis pain   Comments Guided pt through stretches to help with gluteal pain/discomfort   Duration Continue with 30 min of aerobic exercise without signs/symptoms of physical distress.   Intensity THRR unchanged     Progression   Progression Continue to progress workloads to maintain intensity without signs/symptoms of physical distress.   Average METs 2.5     Resistance Training   Training Prescription Yes   Weight 5lbs   Reps 10-15   Time 10 Minutes     Treadmill   MPH 2.4   Grade 1   Minutes 15   METs 3.08     NuStep   Level 4   SPM 80  decr. SPM due to  gluteal pain   Minutes 15   METs 1.9     Home Exercise Plan   Plans to continue exercise at Home (comment)  walking   Frequency Add  2 additional days to program exercise sessions.   Initial Home Exercises Provided 09/25/16      Nutrition:  Target Goals: Understanding of nutrition guidelines, daily intake of sodium 1500mg , cholesterol 200mg , calories 30% from fat and 7% or less from saturated fats, daily to have 5 or more servings of fruits and vegetables.  Biometrics:     Pre Biometrics - 09/10/16 1139      Pre Biometrics   Waist Circumference 43 inches   Hip Circumference 51 inches   Waist to Hip Ratio 0.84 %   Triceps Skinfold 58 mm   % Body Fat 50 %   Grip Strength 36 kg   Flexibility 14 in   Single Leg Stand 17.75 seconds       Nutrition Therapy Plan and Nutrition Goals:   Nutrition Discharge: Nutrition Scores:   Nutrition Goals Re-Evaluation:   Nutrition Goals Re-Evaluation:   Nutrition Goals Discharge (Final Nutrition Goals Re-Evaluation):   Psychosocial: Target Goals: Acknowledge presence or absence of significant depression and/or stress, maximize coping skills, provide positive support system. Participant is able to verbalize types and ability to use techniques and skills needed for reducing stress and depression.  Initial Review & Psychosocial Screening:     Initial Psych Review & Screening - 09/10/16 1605      Initial Review   Current issues with Current Stress Concerns;Current Anxiety/Panic   Source of Stress Concerns Chronic Illness;Unable to participate in former interests or hobbies;Family   Comments Pt has young daughter and wonders with her current health issues what the future will be.     Family Dynamics   Good Support System? Yes     Barriers   Psychosocial barriers to participate in program The patient should benefit from training in stress management and relaxation.     Screening Interventions   Interventions Encouraged to  exercise      Quality of Life Scores:     Quality of Life - 09/23/16 1631      Quality of Life Scores   Health/Function Pre 5.77 %  pt QOL severely altered by her physical disablity and social isolation from recent cardiac events.  health related anxiety and pt symptoms make it difficult for her to engage in day to day activities. pt is encouraged to participate in CR to decrease sx.   Socioeconomic Pre 16.86 %   Psych/Spiritual Pre 9.57 %   Family Pre 26.1 %   GLOBAL Pre 11.82 %  pt admits to feelings of helplessness and hopelessness. pt denies suicidal tendancies and reports improvement in her perspective and outlook recently.  pt is currenlty under psychiatric care seeking new counselor. list of area counselors provided to her       PHQ-9: Recent Review Flowsheet Data    Depression screen [facility] 2/9 09/16/2016 02/16/2015 12/27/2013   Decreased Interest 2 0 3   Down, Depressed, Hopeless 2 0 3   PHQ - 2 Score 4 0 6   Altered sleeping 2 - 1   Tired, decreased energy 2 - 1   Change in appetite 2 - 0   Feeling bad or failure about yourself  2 - 3   Trouble concentrating 2 - 3   Moving slowly or fidgety/restless 0 - 3   Suicidal thoughts 0 - 0   PHQ-9 Score 14 - 17   Difficult doing work/chores Very difficult - -     Interpretation of Total Score  Total Score Depression Severity:  1-4 = Minimal depression, 5-9 =  Mild depression, 10-14 = Moderate depression, 15-19 = Moderately severe depression, 20-27 = Severe depression   Psychosocial Evaluation and Intervention:     Psychosocial Evaluation - 09/16/16 1642      Psychosocial Evaluation & Interventions   Interventions Therapist referral;Physician referral;Stress management education;Relaxation education;Encouraged to exercise with the program and follow exercise prescription   Comments pt is currently being treated by psychiatrist however she is looking additional counseling. offered to schedule appt, however she would like to  discuss this possiblity with her psychiatrist first.     Expected Outcomes pt will continue current depression/ anxiety treatment to develop effective coping skills and positive outlook.    Continue Psychosocial Services  Follow up required by staff      Psychosocial Re-Evaluation:     Psychosocial Re-Evaluation    [place] Name 10/09/16 1104 11/10/16 1630           Psychosocial Re-Evaluation   Current issues with Current Depression;Current Anxiety/Panic;Current Stress Concerns Current Depression;Current Anxiety/Panic;Current Stress Concerns      Comments pt with known depression history and health related anxiety exhibits good coping skills and decreased depression symptoms.   pt with known depression history and health related anxiety exhibits good coping skills and decreased depression symptoms.        Expected Outcomes pt will exhibit positive outlook with good coping skills.  pt will exhibit positive outlook with good coping skills.       Interventions Stress management education;Relaxation education;Encouraged to attend Cardiac Rehabilitation for the exercise;Therapist referral Stress management education;Relaxation education;Encouraged to attend Cardiac Rehabilitation for the exercise;Therapist referral      Continue Psychosocial Services  Follow up required by staff Follow up required by staff         Psychosocial Discharge (Final Psychosocial Re-Evaluation):     Psychosocial Re-Evaluation - 11/10/16 1630      Psychosocial Re-Evaluation   Current issues with Current Depression;Current Anxiety/Panic;Current Stress Concerns   Comments pt with known depression history and health related anxiety exhibits good coping skills and decreased depression symptoms.     Expected Outcomes pt will exhibit positive outlook with good coping skills.    Interventions Stress management education;Relaxation education;Encouraged to attend Cardiac Rehabilitation for the exercise;Therapist referral    Continue Psychosocial Services  Follow up required by staff      Vocational Rehabilitation: Provide vocational rehab assistance to qualifying candidates.   Vocational Rehab Evaluation & Intervention:     Vocational Rehab - 09/10/16 1606      Initial Vocational Rehab Evaluation & Intervention   Assessment shows need for Vocational Rehabilitation No      Education: Education Goals: Education classes will be provided on a weekly basis, covering required topics. Participant will state understanding/return demonstration of topics presented.  Learning Barriers/Preferences:     Learning Barriers/Preferences - 09/10/16 9163      Learning Barriers/Preferences   Learning Barriers Sight   Learning Preferences Written Material      Education Topics: Count Your Pulse:  -Group instruction provided by verbal instruction, demonstration, patient participation and written materials to support subject.  Instructors address importance of being able to find your pulse and how to count your pulse when at home without a heart monitor.  Patients get hands on experience counting their pulse with staff help and individually.   CARDIAC REHAB PHASE II EXERCISE from 10/02/2016 in [place]  Date  09/25/16  Instruction Review Code  2- meets goals/outcomes      Heart  Attack, Angina, and Risk Factor Modification:  -Group instruction provided by verbal instruction, video, and written materials to support subject.  Instructors address signs and symptoms of angina and heart attacks.    Also discuss risk factors for heart disease and how to make changes to improve heart health risk factors.   Functional Fitness:  -Group instruction provided by verbal instruction, demonstration, patient participation, and written materials to support subject.  Instructors address safety measures for doing things around the house.  Discuss how to get up and down off the floor, how to pick  things up properly, how to safely get out of a chair without assistance, and balance training.   Meditation and Mindfulness:  -Group instruction provided by verbal instruction, patient participation, and written materials to support subject.  Instructor addresses importance of mindfulness and meditation practice to help reduce stress and improve awareness.  Instructor also leads participants through a meditation exercise.    Stretching for Flexibility and Mobility:  -Group instruction provided by verbal instruction, patient participation, and written materials to support subject.  Instructors lead participants through series of stretches that are designed to increase flexibility thus improving mobility.  These stretches are additional exercise for major muscle groups that are typically performed during regular warm up and cool down.   Hands Only CPR:  -Group verbal, video, and participation provides a basic overview of AHA guidelines for community CPR. Role-play of emergencies allow participants the opportunity to practice calling for help and chest compression technique with discussion of AED use.   Hypertension: -Group verbal and written instruction that provides a basic overview of hypertension including the most recent diagnostic guidelines, risk factor reduction with self-care instructions and medication management.    Nutrition I class: Heart Healthy Eating:  -Group instruction provided by PowerPoint slides, verbal discussion, and written materials to support subject matter. The instructor gives an explanation and review of the Therapeutic Lifestyle Changes diet recommendations, which includes a discussion on lipid goals, dietary fat, sodium, fiber, plant stanol/sterol esters, sugar, and the components of a well-balanced, healthy diet.   Nutrition II class: Lifestyle Skills:  -Group instruction provided by PowerPoint slides, verbal discussion, and written materials to support subject  matter. The instructor gives an explanation and review of label reading, grocery shopping for heart health, heart healthy recipe modifications, and ways to make healthier choices when eating out.   Diabetes Question & Answer:  -Group instruction provided by PowerPoint slides, verbal discussion, and written materials to support subject matter. The instructor gives an explanation and review of diabetes co-morbidities, pre- and post-prandial blood glucose goals, pre-exercise blood glucose goals, signs, symptoms, and treatment of hypoglycemia and hyperglycemia, and foot care basics.   CARDIAC REHAB PHASE II EXERCISE from 10/02/2016 in [place]  Date  09/25/16  Educator  RD  Instruction Review Code  2- meets goals/outcomes      Diabetes Blitz:  -Group instruction provided by PowerPoint slides, verbal discussion, and written materials to support subject matter. The instructor gives an explanation and review of the physiology behind type 1 and type 2 diabetes, diabetes medications and rational behind using different medications, pre- and post-prandial blood glucose recommendations and Hemoglobin A1c goals, diabetes diet, and exercise including blood glucose guidelines for exercising safely.    Portion Distortion:  -Group instruction provided by PowerPoint slides, verbal discussion, written materials, and food models to support subject matter. The instructor gives an explanation of serving size versus portion size, changes in portions sizes over  the last 20 years, and what consists of a serving from each food group.   CARDIAC REHAB PHASE II EXERCISE from 10/02/2016 in [place]  Date  09/17/16  Educator  RD  Instruction Review Code  2- meets goals/outcomes      Stress Management:  -Group instruction provided by verbal instruction, video, and written materials to support subject matter.  Instructors review role of stress in heart  disease and how to cope with stress positively.     CARDIAC REHAB PHASE II EXERCISE from 10/02/2016 in [place]  Date  09/23/16  Instruction Review Code  2- meets goals/outcomes      Exercising on Your Own:  -Group instruction provided by verbal instruction, power point, and written materials to support subject.  Instructors discuss benefits of exercise, components of exercise, frequency and intensity of exercise, and end points for exercise.  Also discuss use of nitroglycerin and activating EMS.  Review options of places to exercise outside of rehab.  Review guidelines for sex with heart disease.   Cardiac Drugs I:  -Group instruction provided by verbal instruction and written materials to support subject.  Instructor reviews cardiac drug classes: antiplatelets, anticoagulants, beta blockers, and statins.  Instructor discusses reasons, side effects, and lifestyle considerations for each drug class.   Cardiac Drugs II:  -Group instruction provided by verbal instruction and written materials to support subject.  Instructor reviews cardiac drug classes: angiotensin converting enzyme inhibitors (ACE-I), angiotensin II receptor blockers (ARBs), nitrates, and calcium channel blockers.  Instructor discusses reasons, side effects, and lifestyle considerations for each drug class.   Anatomy and Physiology of the Circulatory System:  Group verbal and written instruction and models provide basic cardiac anatomy and physiology, with the coronary electrical and arterial systems. Review of: AMI, Angina, Valve disease, Heart Failure, Peripheral Artery Disease, Cardiac Arrhythmia, Pacemakers, and the ICD.   Other Education:  -Group or individual verbal, written, or video instructions that support the educational goals of the cardiac rehab program.   Knowledge Questionnaire Score:     Knowledge Questionnaire Score - 09/10/16 1133      Knowledge Questionnaire Score    Pre Score 23/24      Core Components/Risk Factors/Patient Goals at Admission:     Personal Goals and Risk Factors at Admission - 09/10/16 1139      Core Components/Risk Factors/Patient Goals on Admission    Weight Management Yes;Obesity;Weight Maintenance;Weight Loss   Intervention Weight Management/Obesity: Establish reasonable short term and long term weight goals.;Weight Management: Provide education and appropriate resources to help participant work on and attain dietary goals.;Weight Management: Develop a combined nutrition and exercise program designed to reach desired caloric intake, while maintaining appropriate intake of nutrient and fiber, sodium and fats, and appropriate energy expenditure required for the weight goal.;Obesity: Provide education and appropriate resources to help participant work on and attain dietary goals.   Expected Outcomes Short Term: Continue to assess and modify interventions until short term weight is achieved;Long Term: Adherence to nutrition and physical activity/exercise program aimed toward attainment of established weight goal;Weight Maintenance: Understanding of the daily nutrition guidelines, which includes 25-35% calories from fat, 7% or less cal from saturated fats, less than 200mg  cholesterol, less than 1.5gm of sodium, & 5 or more servings of fruits and vegetables daily;Weight Loss: Understanding of general recommendations for a balanced deficit meal plan, which promotes 1-2 lb weight loss per week and includes a negative energy balance of [phone] kcal/d;Understanding  recommendations for meals to include 15-35% energy as protein, 25-35% energy from fat, 35-60% energy from carbohydrates, less than 200mg  of dietary cholesterol, 20-35 gm of total fiber daily;Understanding of distribution of calorie intake throughout the day with the consumption of 4-5 meals/snacks   Diabetes Yes   Intervention Provide education about signs/symptoms and action to take for  hypo/hyperglycemia.;Provide education about proper nutrition, including hydration, and aerobic/resistive exercise prescription along with prescribed medications to achieve blood glucose in normal ranges: Fasting glucose 65-99 mg/dL   Expected Outcomes Short Term: Participant verbalizes understanding of the signs/symptoms and immediate care of hyper/hypoglycemia, proper foot care and importance of medication, aerobic/resistive exercise and nutrition plan for blood glucose control.;Long Term: Attainment of HbA1C < 7%.   Heart Failure Yes   Intervention Provide a combined exercise and nutrition program that is supplemented with education, support and counseling about heart failure. Directed toward relieving symptoms such as shortness of breath, decreased exercise tolerance, and extremity edema.   Expected Outcomes Improve functional capacity of life;Short term: Attendance in program 2-3 days a week with increased exercise capacity. Reported lower sodium intake. Reported increased fruit and vegetable intake. Reports medication compliance.;Short term: Daily weights obtained and reported for increase. Utilizing diuretic protocols set by physician.;Long term: Adoption of self-care skills and reduction of barriers for early signs and symptoms recognition and intervention leading to self-care maintenance.   Hypertension Yes   Intervention Provide education on lifestyle modifcations including regular physical activity/exercise, weight management, moderate sodium restriction and increased consumption of fresh fruit, vegetables, and low fat dairy, alcohol moderation, and smoking cessation.;Monitor prescription use compliance.   Expected Outcomes Short Term: Continued assessment and intervention until BP is < 140/45mm HG in hypertensive participants. < 130/33mm HG in hypertensive participants with diabetes, heart failure or chronic kidney disease.;Long Term: Maintenance of blood pressure at goal levels.   Stress Yes    Intervention Offer individual and/or small group education and counseling on adjustment to heart disease, stress management and health-related lifestyle change. Teach and support self-help strategies.;Refer participants experiencing significant psychosocial distress to appropriate mental health specialists for further evaluation and treatment. When possible, include family members and significant others in education/counseling sessions.   Expected Outcomes Short Term: Participant demonstrates changes in health-related behavior, relaxation and other stress management skills, ability to obtain effective social support, and compliance with psychotropic medications if prescribed.;Long Term: Emotional wellbeing is indicated by absence of clinically significant psychosocial distress or social isolation.      Core Components/Risk Factors/Patient Goals Review:      Goals and Risk Factor Review    Row Name 10/09/16 1101 11/10/16 1629           Core Components/Risk Factors/Patient Goals Review   Personal Goals Review Weight Management/Obesity;Diabetes;Heart Failure;Hypertension;Stress Weight Management/Obesity;Diabetes;Heart Failure;Hypertension;Stress      Review pt with multiple risk factors demonstrates eagerness to participate in CR activities.  pt is encouraged by her increased strength/stamina which allowed to participate in social outings. pt congratulated on this success.  pt demonstrates good diabetes, heart failure and hypertension self care practices.   pt with multiple risk factors demonstrates eagerness to participate in CR activities.  pt demonstrates good diabetes, heart failure and hypertension self care practices.        Expected Outcomes t will participate in CR exercise, nutrition and lifestyle education opportunities to decrease overall CAD risk factors.  t will participate in CR exercise, nutrition and lifestyle education opportunities to decrease overall CAD risk factors.  Core Components/Risk Factors/Patient Goals at Discharge (Final Review):      Goals and Risk Factor Review - 11/10/16 1629      Core Components/Risk Factors/Patient Goals Review   Personal Goals Review Weight Management/Obesity;Diabetes;Heart Failure;Hypertension;Stress   Review pt with multiple risk factors demonstrates eagerness to participate in CR activities.  pt demonstrates good diabetes, heart failure and hypertension self care practices.     Expected Outcomes t will participate in CR exercise, nutrition and lifestyle education opportunities to decrease overall CAD risk factors.       ITP Comments:     ITP Comments    Row Name 09/10/16 1053 11/10/16 1629         ITP Comments Medical Director, Dr. Fransico Him  Medical Director, Dr. Fransico Him          Comments: Pt is making expected progress toward personal goals after completing 12sessions. Recommend continued exercise and life style modification education including  stress management and relaxation techniques to decrease cardiac risk profile.

## 2016-11-11 ENCOUNTER — Encounter (HOSPITAL_COMMUNITY)
Admission: RE | Admit: 2016-11-11 | Discharge: 2016-11-11 | Disposition: A | Discharge status: Home / Self Care | Payer: BLUE CROSS/BLUE SHIELD | Source: Ambulatory Visit | Attending: Internal Medicine | Admitting: Internal Medicine

## 2016-11-11 ENCOUNTER — Encounter (HOSPITAL_COMMUNITY): Payer: BLUE CROSS/BLUE SHIELD

## 2016-11-11 DIAGNOSIS — I428 Other cardiomyopathies: Secondary | ICD-10-CM

## 2016-11-13 ENCOUNTER — Encounter (HOSPITAL_COMMUNITY): Admission: RE | Admit: 2016-11-13 | Payer: BLUE CROSS/BLUE SHIELD | Source: Ambulatory Visit

## 2016-11-13 ENCOUNTER — Encounter (HOSPITAL_COMMUNITY): Payer: BLUE CROSS/BLUE SHIELD

## 2016-11-16 ENCOUNTER — Encounter (HOSPITAL_COMMUNITY): Payer: BLUE CROSS/BLUE SHIELD

## 2016-11-16 ENCOUNTER — Encounter (HOSPITAL_COMMUNITY)
Admission: RE | Admit: 2016-11-16 | Discharge: 2016-11-16 | Disposition: A | Discharge status: Home / Self Care | Payer: BLUE CROSS/BLUE SHIELD | Source: Ambulatory Visit | Attending: Internal Medicine | Admitting: Internal Medicine

## 2016-11-16 ENCOUNTER — Encounter: Payer: Self-pay | Admitting: Family Medicine

## 2016-11-16 ENCOUNTER — Telehealth: Payer: Self-pay

## 2016-11-16 ENCOUNTER — Ambulatory Visit (INDEPENDENT_AMBULATORY_CARE_PROVIDER_SITE_OTHER): Payer: BLUE CROSS/BLUE SHIELD | Admitting: Family Medicine

## 2016-11-16 DIAGNOSIS — M999 Biomechanical lesion, unspecified: Secondary | ICD-10-CM | POA: Diagnosis not present

## 2016-11-16 DIAGNOSIS — I428 Other cardiomyopathies: Secondary | ICD-10-CM

## 2016-11-16 DIAGNOSIS — M533 Sacrococcygeal disorders, not elsewhere classified: Secondary | ICD-10-CM

## 2016-11-16 DIAGNOSIS — I5042 Chronic combined systolic (congestive) and diastolic (congestive) heart failure: Secondary | ICD-10-CM | POA: Diagnosis not present

## 2016-11-16 DIAGNOSIS — M9904 Segmental and somatic dysfunction of sacral region: Secondary | ICD-10-CM | POA: Insufficient documentation

## 2016-11-16 HISTORY — DX: Biomechanical lesion, unspecified: M99.9

## 2016-11-16 NOTE — Progress Notes (Signed)
[name] [address] [place], [place] [address] Phone: [phone] Subjective:     CC: Right lower back.  WEX:[id number]  [name] is a 41 y.o. female coming in with complaint of right lower back pain. Has been going on for multiple months. Stay localized. No radiation, no numbness. Hurts though with flexion. Patient denies though any pain that wakes her up at night. Patient though is participating in cardiac rehabilitation and is having a difficult time. Has tried some over-the-counter medicines but cannot take oral anti-inflammatories secondary to her cardiac issues.      Past Medical History:  Diagnosis Date  . AICD (automatic cardioverter/defibrillator) present   . Anxiety   . Arthritis    right shoulder   . Asthma   . CHF (congestive heart failure) ([place])   . Depression   . Diabetes mellitus without complication ([place])   . Dyspnea    comes and goes intermittently mostly with exertion   . Fibroid    age 35  . Gallstones   . Hypertension   . IBS (irritable bowel syndrome)   . Migraine    "monthly" (08/03/2016)  . OSA on CPAP   . Ovarian cyst    1999; surgically removed  . 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 Seizures ([place])    "as a child" (08/03/2016)  . Termination of pregnancy    due to cardiac risk   Past Surgical History:  Procedure Laterality Date  . CARDIAC CATHETERIZATION N/A 11/11/2015   Procedure: Right/Left Heart Cath and Coronary Angiography;  Surgeon: Troy Sine, MD;  Location: [facility];  Service: Cardiovascular;  Laterality: N/A;  . CARDIAC CATHETERIZATION  ~ 2015  . CARDIAC DEFIBRILLATOR PLACEMENT  08/03/2016  . CESAREAN SECTION  1999  . COLONOSCOPY WITH PROPOFOL N/A 04/21/2016   Procedure: COLONOSCOPY WITH PROPOFOL;  Surgeon: Jerene Bears, MD;  Location: [facility];  Service: Gastroenterology;  Laterality: N/A;  . ESOPHAGOGASTRODUODENOSCOPY (EGD) WITH PROPOFOL N/A  04/21/2016   Procedure: ESOPHAGOGASTRODUODENOSCOPY (EGD) WITH PROPOFOL;  Surgeon: Jerene Bears, MD;  Location: [facility];  Service: Gastroenterology;  Laterality: N/A;  . FOOT FRACTURE SURGERY Right ~ 2003  . FRACTURE SURGERY    . ICD IMPLANT N/A 08/03/2016   Procedure: ICD Implant;  Surgeon: Deboraha Sprang, MD;  Location: [facility];  Service: Cardiovascular;  Laterality: N/A;  . LAPAROSCOPIC CHOLECYSTECTOMY  12/2006  . LAPAROSCOPY ABDOMEN DIAGNOSTIC  2008   "cut bile duct w/gallbladder OR; had to go in later & fix leak; hospitalized for 2 months"  . LEFT HEART CATHETERIZATION WITH CORONARY ANGIOGRAM N/A 02/26/2014   Procedure: LEFT HEART CATHETERIZATION WITH CORONARY ANGIOGRAM;  Surgeon: Jettie Booze, MD;  Location: [facility];  Service: Cardiovascular;  Laterality: N/A;  . OVARIAN CYST REMOVAL Right 1999  . TUBAL LIGATION  1999   Social History   Social History  . Marital status: Married    Spouse name: N/A  . Number of children: 1  . Years of education: N/A   Occupational History  . stay at home mom    Social History Main Topics  . Smoking status: Never Smoker  . Smokeless tobacco: Never Used  . Alcohol use No  . Drug use: No  . Sexual activity: Yes   Other Topics Concern  . None   Social History Narrative  . None   Allergies  Allergen Reactions  . Vancomycin Other (See  Comments)    "did something to my kidneys"; went into kidney failure  . Contrast Media [Iodinated Diagnostic Agents] Other (See Comments)    Multiple CT contrast studies done over 2 weeks causing ARF  . Aspirin Other (See Comments)    Wheezing; but patient still takes if she needs to  . Ciprofloxacin Itching and Rash  . Sulfa Antibiotics Itching and Rash   Family History  Problem Relation Age of Onset  . Emphysema Maternal Grandmother        smoked  . Heart disease Maternal Grandmother 70       MI  . Rheum arthritis Mother   . Allergies Daughter   . Colon cancer Neg Hx      Past medical history, social, surgical and family history all reviewed in electronic medical record.  No pertanent information unless stated regarding to the chief complaint.   Review of Systems:Review of systems updated and as accurate as of 11/16/16  No headache, visual changes, nausea, vomiting, diarrhea, constipation, dizziness, abdominal pain, skin rash, fevers, chills, night sweats, weight loss, swollen lymph nodes, body aches, joint swelling, chest pain, shortness of breath, mood changes. Positive muscle aches  Objective  Blood pressure 122/84, pulse (!) 106, height 5\' 5"  (1.651 m), weight 241 lb (109.3 kg), SpO2 98 %. Systems examined below as of 11/16/16   General: No apparent distress alert and oriented x3 mood and affect normal, dressed appropriately. Obese.  HEENT: Pupils equal, extraocular movements intact  Respiratory: [name] speak in full sentences and does not appear short of breath  Cardiovascular: No lower extremity edema, non tender, no erythema  Skin: Warm dry intact with no signs of infection or rash on extremities or on axial skeleton.  Abdomen: Soft nontender  Neuro: Cranial nerves II through XII are intact, neurovascularly intact in all extremities with 2+ DTRs and 2+ pulses.  Lymph: No lymphadenopathy of posterior or anterior cervical chain or axillae bilaterally.  Gait normal with good balance and coordination.  MSK:  Non tender with full range of motion and good stability and symmetric strength and tone of shoulders, elbows, wrist, hip, knee and ankles bilaterally.  Back Exam:  Inspection: Unremarkable  Motion: Flexion 35 deg, Extension 25 deg, Side Bending to 30 deg bilaterally,  Rotation to 35 deg bilaterally  SLR laying: Negative  XSLR laying: Negative  Palpable tenderness: Tender to palpation of the paraspinal musculature on right SI joint. Marland Kitchen FABER: Positive right. Sensory change: Gross sensation intact to all lumbar and sacral dermatomes.   Reflexes: 2+ at both patellar tendons, 2+ at achilles tendons, Babinski's downgoing.  Strength at foot  Plantar-flexion: 5/5 Dorsi-flexion: 5/5 Eversion: 5/5 Inversion: 5/5  Leg strength  Quad: 5/5 Hamstring: 5/5 Hip flexor: 5/5 Hip abductors: 4/5  Gait unremarkable.      Impression and Recommendations:     This case required medical decision making of moderate complexity.      Note: This dictation was prepared with Dragon dictation along with smaller phrase technology. Any transcriptional errors that result from this process are unintentional.

## 2016-11-16 NOTE — Assessment & Plan Note (Signed)
Sacroiliac Joint Mobilization and Rehab 1. Work on pretzel stretching, shoulder back and leg draped in front. 3-5 sets, 30 sec.. 2. hip abductor rotations. standing, hip flexion and rotation outward then inward. 3 sets, 15 reps. when can do comfortably, add ankle weights starting at 2 pounds.  3. cross over stretching - shoulder back to ground, same side leg crossover. 3-5 sets for 30 min..  4. rolling up and back knees to chest and rocking. 5. sacral tilt - 5 sets, hold for 5-10 seconds Patient given the exercises as stated above. Work with Product/process development scientist. Discussed core strengthening hip abductor strength. Follow-up again in 4-6 weeks

## 2016-11-16 NOTE — Telephone Encounter (Signed)
Patient approved for [facility] clinic enrollment by Dr Caryl Comes.  [facility] [employer] alert today for Heart Failure Index 45 which is above threshold.   Attempted call to patient for ICM intro and follow up on alert.  No answer.

## 2016-11-16 NOTE — Assessment & Plan Note (Signed)
Decision today to treat with OMT was based on Physical Exam  After verbal consent patient was treated with HVLA, ME, FPR techniques in cervical, thoracic, lumbar and sacral areas  Patient tolerated the procedure well with improvement in symptoms  Patient given exercises, stretches and lifestyle modifications  See medications in patient instructions if given  Patient will follow up in 4-6 weeks 

## 2016-11-16 NOTE — Patient Instructions (Addendum)
Good to see you  Happy anniversary Exercises 3 times a week.  You know the drill  See me again in 3 weeks.

## 2016-11-18 ENCOUNTER — Encounter (HOSPITAL_COMMUNITY): Payer: BLUE CROSS/BLUE SHIELD

## 2016-11-20 ENCOUNTER — Other Ambulatory Visit: Payer: Self-pay | Admitting: Cardiology

## 2016-11-20 ENCOUNTER — Encounter: Payer: Self-pay | Admitting: Internal Medicine

## 2016-11-20 ENCOUNTER — Encounter (HOSPITAL_COMMUNITY): Payer: BLUE CROSS/BLUE SHIELD

## 2016-11-20 ENCOUNTER — Other Ambulatory Visit: Payer: Self-pay

## 2016-11-20 ENCOUNTER — Ambulatory Visit (INDEPENDENT_AMBULATORY_CARE_PROVIDER_SITE_OTHER): Payer: BLUE CROSS/BLUE SHIELD | Admitting: Internal Medicine

## 2016-11-20 ENCOUNTER — Encounter (HOSPITAL_COMMUNITY)
Admission: RE | Admit: 2016-11-20 | Discharge: 2016-11-20 | Disposition: A | Discharge status: Home / Self Care | Payer: BLUE CROSS/BLUE SHIELD | Source: Ambulatory Visit | Attending: Internal Medicine | Admitting: Internal Medicine

## 2016-11-20 VITALS — BP 100/70 | HR 90 | Ht 65.0 in | Wt 233.4 lb

## 2016-11-20 DIAGNOSIS — I5042 Chronic combined systolic (congestive) and diastolic (congestive) heart failure: Secondary | ICD-10-CM | POA: Diagnosis not present

## 2016-11-20 DIAGNOSIS — I428 Other cardiomyopathies: Secondary | ICD-10-CM | POA: Diagnosis not present

## 2016-11-20 DIAGNOSIS — I5022 Chronic systolic (congestive) heart failure: Secondary | ICD-10-CM | POA: Diagnosis not present

## 2016-11-20 DIAGNOSIS — Z95810 Presence of automatic (implantable) cardiac defibrillator: Secondary | ICD-10-CM | POA: Diagnosis not present

## 2016-11-20 DIAGNOSIS — I493 Ventricular premature depolarization: Secondary | ICD-10-CM

## 2016-11-20 DIAGNOSIS — O903 Peripartum cardiomyopathy: Secondary | ICD-10-CM

## 2016-11-20 MED ORDER — MAGNESIUM OXIDE 400 MG PO TABS: 400.0000 mg | ORAL_TABLET | Freq: Every day | ORAL | 3 refills | Status: DC

## 2016-11-20 MED ORDER — VICTOZA 18 MG/3ML ~~LOC~~ SOPN: 1.8000 mg | PEN_INJECTOR | Freq: Every day | SUBCUTANEOUS | 0 refills | Status: DC

## 2016-11-20 MED ORDER — MAGNESIUM OXIDE 400 MG PO TABS: 400.0000 mg | ORAL_TABLET | Freq: Every day | ORAL | Status: DC

## 2016-11-20 NOTE — Patient Instructions (Signed)
Medication Instructions: - Your physician has recommended you make the following change in your medication:  1) Increase lasix (furosemide) 80 mg - take 1 & 1/2 tablets (120 mg) in the morning and 1 tablet (80 mg) in the afternoon- 3 out of the next 5 days, then resume your normal dosing 2) Start magnesium oxide 400 mg- take 1 tablet by mouth once daily  Labwork: - none ordered  Procedures/Testing: - none ordered  Follow-Up: - Remote monitoring is used to monitor your Pacemaker of ICD from home. This monitoring reduces the number of office visits required to check your device to one time per year. It allows [place] to keep an eye on the functioning of your device to ensure it is working properly. You are scheduled for a device check from home on 12/21/16 (fluid check only) & 02/22/17 (full transmission). You may send your transmission at any time that day. If you have a wireless device, the transmission will be sent automatically. After your physician reviews your transmission, you will receive a postcard with your next transmission date.  - Your physician wants you to follow-up in: 9 months with Tommye Standard, PA for Dr. Caryl Comes. You will receive a reminder letter in the mail two months in advance. If you don't receive a letter, please call our office to schedule the follow-up appointment.   Any Additional Special Instructions Will Be Listed Below (If Applicable).     If you need a refill on your cardiac medications before your next appointment, please call your pharmacy.

## 2016-11-20 NOTE — Telephone Encounter (Signed)
Patient agreed to [facility] monthly follow up during office visit with Dr Caryl Comes today.  Furosemide was increased today and will recheck fluid levels on 11/24/2016.   Call to patient for intro.  Advised patient ICM transmission scheduled 11/24/2016 to recheck fluid levels.  She confirmed she is increasing Furosemide x 3 days as Dr Caryl Comes instructed today. Provided patient with ICM number.

## 2016-11-20 NOTE — Progress Notes (Signed)
ELECTROPHYSIOLOGY PROGRESS  NOTE  Patient ID: [name], MRN: [id number], DOB/AGE: [birth year] 41 y.o. Admit date: (Not on file) Date of Consult: 11/20/2016  Primary Physician: Golden Circle, FNP Primary Cardiologist: [facility]   HPI [name] is a 41 y.o. female  seen in follow-up for ICD implanted 3/18 for primary prevention. 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 She's been managed with Entresto, carvedilol, varying and spironolactone as well as diuretics.  She is obstructive sleep apnea and wears CPAP. She is morbidly obese.  Over the last couple of weeks she has had increasing problems with cough and shortness of breath. She thought it was an infection. Unfortunately has not abated. She thinks it now is more likely related to congestive failure  She had decreased her diuretics at home as she was having leg cramps these abated following decrease from 80/80--80/40. More recently she has resumed 80/88 cause of the aforementioned symptoms   DATE TEST    9/15 Echo EF 35-40%   6/17  Echo   EF 25-30 %   6/17  Cath   EF 25 %    Normal CA  12/17 Echo EF 20-25%     7 Date Cr K Mg  5/18  0.88 4.1                Past Medical History:  Diagnosis Date  . AICD (automatic cardioverter/defibrillator) present   . Anxiety   . Arthritis    right shoulder   . Asthma   . CHF (congestive heart failure) ([place])   . Depression   . Diabetes mellitus without complication ([place])   . Dyspnea    comes and goes intermittently mostly with exertion   . Fibroid    age 28  . Gallstones   . Hypertension   . IBS (irritable bowel syndrome)   . Migraine    "monthly" (08/03/2016)  . OSA on CPAP   . Ovarian cyst    1999; surgically removed  . 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 Seizures ([place])    "as a child" (08/03/2016)  . Termination of pregnancy    due to cardiac risk      Surgical History:  Past  Surgical History:  Procedure Laterality Date  . CARDIAC CATHETERIZATION N/A 11/11/2015   Procedure: Right/Left Heart Cath and Coronary Angiography;  Surgeon: Troy Sine, MD;  Location: [place] [facility];  Service: Cardiovascular;  Laterality: N/A;  . CARDIAC CATHETERIZATION  ~ 2015  . CARDIAC DEFIBRILLATOR PLACEMENT  08/03/2016  . CESAREAN SECTION  1999  . COLONOSCOPY WITH PROPOFOL N/A 04/21/2016   Procedure: COLONOSCOPY WITH PROPOFOL;  Surgeon: Jerene Bears, MD;  Location: [facility];  Service: Gastroenterology;  Laterality: N/A;  . ESOPHAGOGASTRODUODENOSCOPY (EGD) WITH PROPOFOL N/A 04/21/2016   Procedure: ESOPHAGOGASTRODUODENOSCOPY (EGD) WITH PROPOFOL;  Surgeon: Jerene Bears, MD;  Location: [facility];  Service: Gastroenterology;  Laterality: N/A;  . FOOT FRACTURE SURGERY Right ~ 2003  . FRACTURE SURGERY    . ICD IMPLANT N/A 08/03/2016   Procedure: ICD Implant;  Surgeon: Deboraha Sprang, MD;  Location: [place] [facility];  Service: Cardiovascular;  Laterality: N/A;  . LAPAROSCOPIC CHOLECYSTECTOMY  12/2006  . LAPAROSCOPY ABDOMEN DIAGNOSTIC  2008   "cut bile duct w/gallbladder OR; had to go in later & fix leak; hospitalized for 2 months"  . LEFT HEART CATHETERIZATION WITH CORONARY ANGIOGRAM N/A 02/26/2014  Procedure: LEFT HEART CATHETERIZATION WITH CORONARY ANGIOGRAM;  Surgeon: Jettie Booze, MD;  Location: [facility];  Service: Cardiovascular;  Laterality: N/A;  . OVARIAN CYST REMOVAL Right 1999  . TUBAL LIGATION  1999     Home Meds: Prior to Admission medications   Medication Sig Start Date End Date Taking? Authorizing Provider  albuterol (PROVENTIL HFA;VENTOLIN HFA) 108 (90 Base) MCG/ACT inhaler Inhale 2 puffs into the lungs every 6 (six) hours as needed for wheezing or shortness of breath. 01/10/16   Golden Circle, FNP  Armodafinil (NUVIGIL) 250 MG tablet Take 250 mg by mouth daily.    Historical Provider, MD  budesonide-formoterol (SYMBICORT) 160-4.5 MCG/ACT inhaler  Inhale 2 puffs into the lungs 2 (two) times daily. 04/28/16   Golden Circle, FNP  carvedilol (COREG) 25 MG tablet Take 1 tablet (25 mg total) by mouth 2 (two) times daily with a meal. 01/10/16   Belva Crome, MD  colestipol (COLESTID) 1 g tablet Take 2 tablets (2 g total) by mouth daily. 04/27/16   Jerene Bears, MD  cyclobenzaprine (FLEXERIL) 10 MG tablet Take 1 tablet (10 mg total) by mouth 3 (three) times daily as needed for muscle spasms. 01/10/16   Golden Circle, FNP  diazepam (VALIUM) 10 MG tablet Take 10 mg by mouth daily. 08/17/15   Historical Provider, MD  Diclofenac Sodium 2 % SOLN Apply 1 pump twice daily. Patient taking differently: Apply 1 application topically as needed (for pain).  12/03/15   Lyndal Pulley, DO  dicyclomine (BENTYL) 20 MG tablet Take 1 tablet (20 mg total) by mouth 2 (two) times daily with a meal. 04/28/16   Jerene Bears, MD  fluticasone (FLONASE) 50 MCG/ACT nasal spray Place 2 sprays into both nostrils daily as needed for allergies.     Historical Provider, MD  furosemide (LASIX) 80 MG tablet Take 80 mg by mouth 2 (two) times daily. 01/19/16   Historical Provider, MD  gabapentin (NEURONTIN) 100 MG capsule Take 100 mg by mouth daily as needed (for pain).     Historical Provider, MD  HYDROcodone-acetaminophen (NORCO/VICODIN) 5-325 MG tablet Take 1-2 tablets by mouth every 6 (six) hours as needed. 05/11/16   Kristen N Ward, DO  ivabradine (CORLANOR) 5 MG TABS tablet Take 1 tablet (5 mg total) by mouth 2 (two) times daily with a meal. 04/13/16   Belva Crome, MD  ondansetron (ZOFRAN ODT) 4 MG disintegrating tablet Take 1 tablet (4 mg total) by mouth every 8 (eight) hours as needed for nausea or vomiting. 05/11/16   Kristen N Ward, DO  potassium chloride (K-DUR) 10 MEQ tablet Take 1 tablet (10 mEq total) by mouth daily. 05/29/16 08/27/16  Belva Crome, MD  Probiotic Product (ALIGN) 4 MG CAPS Take 4 mg by mouth daily.    Historical Provider, MD  promethazine (PHENERGAN) 25 MG  tablet Take 1 tablet (25 mg total) by mouth every 8 (eight) hours as needed for nausea or vomiting. 04/28/16   Golden Circle, FNP  promethazine-codeine (PHENERGAN WITH CODEINE) 6.25-10 MG/5ML syrup Take 5 mLs by mouth every 6 (six) hours as needed for cough. 04/28/16   Golden Circle, FNP  sacubitril-valsartan (ENTRESTO) 97-103 MG Take 1 tablet by mouth 2 (two) times daily. 02/19/16   Belva Crome, MD  spironolactone (ALDACTONE) 25 MG tablet Take 1 tablet (25 mg total) by mouth daily. 11/20/15   Brittainy M Simmons, PA-C  UNABLE TO FIND CPAP    Historical  Provider, MD  venlafaxine XR (EFFEXOR-XR) 75 MG 24 hr capsule Take 225 mg by mouth daily with breakfast.    Historical Provider, MD  VICTOZA 18 MG/3ML SOPN Inject 0.3 mLs (1.8 mg total) into the skin daily. 05/18/16   Golden Circle, FNP  vitamin B-12 (CYANOCOBALAMIN) 500 MCG tablet Take 500 mcg by mouth daily.    Historical Provider, MD  Vitamin D, Ergocalciferol, (DRISDOL) 50000 units CAPS capsule TAKE ONE CAPSULE BY MOUTH BY MOUTH ONCE A WEEK 04/27/16   Lyndal Pulley, DO    Allergies:  Allergies  Allergen Reactions  . Vancomycin Other (See Comments)    "did something to my kidneys"; went into kidney failure  . Contrast Media [Iodinated Diagnostic Agents] Other (See Comments)    Multiple CT contrast studies done over 2 weeks causing ARF  . Aspirin Other (See Comments)    Wheezing; but patient still takes if she needs to  . Ciprofloxacin Itching and Rash  . Sulfa Antibiotics Itching and Rash          Physical Exam:  Blood pressure 100/70, pulse 90, height 5\' 5"  (1.651 m), weight 233 lb 6.4 oz (105.9 kg). Well developed and nourished in no acute distress HENT normal  JVP 7-8-flat Carotids brisk and full without bruits Clear Device pocket well healed; without hematoma or erythema.  There is no tethering  Regular rate and rhythm, no murmurs or gallops Abd-soft with active BS without hepatomegaly No Clubbing cyanosis tr  edema Skin-warm and dry A & Oriented  Grossly normal sensory and motor function     Labs: Cardiac Enzymes No results for input(s): CKTOTAL, CKMB, TROPONINI in the last 72 hours. CBC Lab Results  Component Value Date   WBC 6.3 07/30/2016   HGB 13.4 07/30/2016   HCT 39.8 07/30/2016   MCV 87 07/30/2016   PLT 231 07/30/2016   PROTIME: No results for input(s): LABPROT, INR in the last 72 hours. Chemistry No results for input(s): NA, K, CL, CO2, BUN, CREATININE, CALCIUM, PROT, BILITOT, ALKPHOS, ALT, AST, GLUCOSE in the last 168 hours.  Invalid input(s): LABALBU Lipids Lab Results  Component Value Date   CHOL 160 11/09/2015   HDL 58 11/09/2015   LDLCALC 88 11/09/2015   TRIG 70 11/09/2015   BNP Pro B Natriuretic peptide (BNP)  Date/Time Value Ref Range Status  02/20/2014 03:10 PM 933.6 (H) 0 - 125 pg/mL Final  02/19/2014 04:43 PM 947.6 (H) 0 - 125 pg/mL Final  10/03/2013 07:35 PM 1,319.0 (H) 0 - 125 pg/mL Final  09/29/2013 12:29 PM 129.0 (H) 0.0 - 100.0 pg/mL Final   Thyroid Function Tests: No results for input(s): TSH, T4TOTAL, T3FREE, THYROIDAB in the last 72 hours.  Invalid input(s): FREET3 Miscellaneous Lab Results  Component Value Date   DDIMER 1.79 (H) 11/08/2015    Radiology/Studies:  No results found.  EKG: sinus at 90 18/ 09/39 LAD         Assessment and Plan:    NICM  CHF systolic acute chronic  Anxiety/depression   Implantable defibrillator-[employer]  PVCs  The patient has some symptoms of acute on chronic congestive failure consistent with findings on device interrogation. We will increase her Lasix from 80/80--120/80.  With her night cramps I started her on mag oxide one by mouth daily.  Continue otther guideline directed medical therapy       Virl Axe

## 2016-11-23 ENCOUNTER — Encounter (HOSPITAL_COMMUNITY): Payer: BLUE CROSS/BLUE SHIELD

## 2016-11-24 ENCOUNTER — Ambulatory Visit (INDEPENDENT_AMBULATORY_CARE_PROVIDER_SITE_OTHER): Payer: BLUE CROSS/BLUE SHIELD

## 2016-11-24 DIAGNOSIS — Z95810 Presence of automatic (implantable) cardiac defibrillator: Secondary | ICD-10-CM

## 2016-11-24 DIAGNOSIS — I5022 Chronic systolic (congestive) heart failure: Secondary | ICD-10-CM

## 2016-11-24 NOTE — Progress Notes (Signed)
EPIC Encounter for ICM Monitoring  Patient Name: [name] is a 41 y.o. female Date: 11/24/2016 Primary Care Physican: Golden Circle, [place] Primary Cardiologist: Smith/Bensimhon Electrophysiologist: Caryl Comes Dry Weight: 232 lbs          Heart Failure questions reviewed, pt complains of a cough which she discussed at office visit with Dr Caryl Comes 11/20/2016.  She increased Lasix (furosemide) to 80 mg -1 & 1/2 tablets (120 mg) in the morning and 1 tablet (80 mg) in the afternoon x 3 days as instructed at office visit.   She feels tired today.   Prescribed dosage: Furosemide 80 mg 1 tablet twice a day.  Potassium 10 mEq 1 tablet daily  Recommendations: No changes.   Encouraged to call for fluid symptoms.  Follow-up plan: ICM clinic phone appointment on 12/08/2016.   Office appointment with Dr Tamala Julian 12/15/2016  Copy of ICM check sent to primary cardiologist and device physician.   Implanted Device Measures                  Most Recent Daily Measurement: 11/23/2016  [employer] Heart Failure Index:   43 Sleep Incline:     28 degrees Respiratory Rate:     21.5.0 rpm Activity Level:     0.7 hour(s) Mean Heart Rate:     98 bpm    8 Day Trend Data  3 Month Trend: 08/24/2016 to 11/24/2016

## 2016-11-27 ENCOUNTER — Encounter (HOSPITAL_COMMUNITY): Payer: BLUE CROSS/BLUE SHIELD

## 2016-11-27 ENCOUNTER — Encounter (HOSPITAL_COMMUNITY)
Admission: RE | Admit: 2016-11-27 | Discharge: 2016-11-27 | Disposition: A | Discharge status: Home / Self Care | Payer: BLUE CROSS/BLUE SHIELD | Source: Ambulatory Visit | Attending: Internal Medicine | Admitting: Internal Medicine

## 2016-11-27 DIAGNOSIS — I428 Other cardiomyopathies: Secondary | ICD-10-CM

## 2016-11-27 DIAGNOSIS — I5042 Chronic combined systolic (congestive) and diastolic (congestive) heart failure: Secondary | ICD-10-CM | POA: Diagnosis not present

## 2016-11-27 LAB — CUP PACEART INCLINIC DEVICE CHECK
Date Time Interrogation Session: 20180629040000
HighPow Impedance: 75 Ohm
Implantable Lead Implant Date: 20180312
Implantable Lead Location: 753860
Implantable Lead Model: 293
Implantable Lead Serial Number: 422842
Implantable Pulse Generator Implant Date: 20180312
Lead Channel Impedance Value: 513 Ohm
Lead Channel Pacing Threshold Amplitude: 0.7 V
Lead Channel Pacing Threshold Pulse Width: 0.4 ms
Lead Channel Sensing Intrinsic Amplitude: 19.1 mV
Lead Channel Setting Pacing Amplitude: 3.5 V
Lead Channel Setting Pacing Pulse Width: 0.4 ms
Lead Channel Setting Sensing Sensitivity: 0.5 mV
Pulse Gen Serial Number: 226601

## 2016-11-30 ENCOUNTER — Encounter (HOSPITAL_COMMUNITY): Payer: BLUE CROSS/BLUE SHIELD

## 2016-12-01 LAB — HM DIABETES EYE EXAM

## 2016-12-02 ENCOUNTER — Encounter (HOSPITAL_COMMUNITY): Payer: BLUE CROSS/BLUE SHIELD

## 2016-12-02 ENCOUNTER — Encounter (HOSPITAL_COMMUNITY)
Admission: RE | Admit: 2016-12-02 | Discharge: 2016-12-02 | Disposition: A | Discharge status: Home / Self Care | Payer: BLUE CROSS/BLUE SHIELD | Source: Ambulatory Visit | Attending: Internal Medicine | Admitting: Internal Medicine

## 2016-12-02 DIAGNOSIS — I428 Other cardiomyopathies: Secondary | ICD-10-CM

## 2016-12-02 DIAGNOSIS — I5042 Chronic combined systolic (congestive) and diastolic (congestive) heart failure: Secondary | ICD-10-CM | POA: Diagnosis not present

## 2016-12-02 LAB — GLUCOSE, CAPILLARY: Glucose-Capillary: 105 mg/dL — ABNORMAL HIGH (ref 65–99)

## 2016-12-04 ENCOUNTER — Encounter (HOSPITAL_COMMUNITY): Payer: BLUE CROSS/BLUE SHIELD

## 2016-12-07 ENCOUNTER — Encounter (HOSPITAL_COMMUNITY): Payer: BLUE CROSS/BLUE SHIELD

## 2016-12-08 ENCOUNTER — Ambulatory Visit (INDEPENDENT_AMBULATORY_CARE_PROVIDER_SITE_OTHER): Payer: Self-pay

## 2016-12-08 ENCOUNTER — Telehealth: Payer: Self-pay

## 2016-12-08 DIAGNOSIS — I5022 Chronic systolic (congestive) heart failure: Secondary | ICD-10-CM

## 2016-12-08 DIAGNOSIS — Z95810 Presence of automatic (implantable) cardiac defibrillator: Secondary | ICD-10-CM

## 2016-12-08 NOTE — Progress Notes (Signed)
Cardiac Individual Treatment Plan  Patient Details  Name: [name] MRN: [id number] Date of Birth: Aug 27, 1975 Referring Provider:     CARDIAC REHAB PHASE II ORIENTATION from 09/10/2016 in [place]  Referring Provider  Glori Bickers MD      Initial Encounter Date:    CARDIAC REHAB PHASE II ORIENTATION from 09/10/2016 in [place]  Date  09/10/16  Referring Provider  Glori Bickers MD      Visit Diagnosis: NICM (nonischemic cardiomyopathy) ([place])  [name] Home Medications on Admission:  Current Outpatient Prescriptions:  .  albuterol (PROVENTIL HFA;VENTOLIN HFA) 108 (90 Base) MCG/ACT inhaler, Inhale 2 puffs into the lungs every 6 (six) hours as needed for wheezing or shortness of breath., Disp: 1 Inhaler, Rfl: 0 .  Armodafinil (NUVIGIL) 250 MG tablet, Take 250 mg by mouth daily., Disp: , Rfl:  .  B-D ULTRAFINE III SHORT PEN 31G X 8 MM MISC, USE TO TEST BLOOD SUGARS 1-2 TIMES A DAY AS NEEDED., Disp: 100 each, Rfl: 0 .  budesonide-formoterol (SYMBICORT) 160-4.5 MCG/ACT inhaler, Inhale 2 puffs into the lungs 2 (two) times daily., Disp: , Rfl:  .  carvedilol (COREG) 25 MG tablet, Take 1 tablet (25 mg total) by mouth 2 (two) times daily with a meal., Disp: 180 tablet, Rfl: 3 .  cefUROXime (CEFTIN) 500 MG tablet, Take 1 tablet (500 mg total) by mouth 2 (two) times daily with a meal., Disp: 14 tablet, Rfl: 0 .  colestipol (COLESTID) 1 g tablet, Take 2 tablets (2 g total) by mouth daily., Disp: 60 tablet, Rfl: 3 .  cyclobenzaprine (FLEXERIL) 10 MG tablet, Take 1 tablet (10 mg total) by mouth 3 (three) times daily as needed for muscle spasms., Disp: 30 tablet, Rfl: 0 .  Diclofenac Sodium 2 % SOLN, Apply 1 pump onto the skin twice daily as needed for pain., Disp: , Rfl:  .  dicyclomine (BENTYL) 20 MG tablet, Take 20 mg by mouth 2 (two) times daily as needed for spasms., Disp: , Rfl:  .  fluticasone (FLONASE) 50 MCG/ACT  nasal spray, Place 2 sprays into both nostrils daily as needed for allergies., Disp: 16 g, Rfl: 2 .  furosemide (LASIX) 80 MG tablet, Take 1 tablet (80 mg total) by mouth 2 (two) times daily., Disp: 60 tablet, Rfl: 6 .  gabapentin (NEURONTIN) 100 MG capsule, Take 100 mg by mouth daily as needed (for pain). , Disp: , Rfl:  .  HYDROcodone-homatropine (HYCODAN) 5-1.5 MG/5ML syrup, Take 5 mLs by mouth every 8 (eight) hours as needed for cough., Disp: 120 mL, Rfl: 0 .  Insulin Pen Needle (NOVOFINE) 32G X 6 MM MISC, Use to administer victoza every day, Disp: 100 each, Rfl: 1 .  ivabradine (CORLANOR) 5 MG TABS tablet, Take 1.5 tablets (7.5 mg total) by mouth 2 (two) times daily with a meal., Disp: 90 tablet, Rfl: 3 .  magnesium oxide (MAG-OX) 400 MG tablet, Take 1 tablet (400 mg total) by mouth daily., Disp: 90 tablet, Rfl: 3 .  ondansetron (ZOFRAN ODT) 4 MG disintegrating tablet, Take 1 tablet (4 mg total) by mouth every 8 (eight) hours as needed for nausea or vomiting., Disp: 20 tablet, Rfl: 0 .  potassium chloride (K-DUR) 10 MEQ tablet, Take 10 mEq by mouth daily., Disp: , Rfl:  .  promethazine-dextromethorphan (PROMETHAZINE-DM) 6.25-15 MG/5ML syrup, Take 5 mLs by mouth 4 (four) times daily as needed., Disp: 118 mL, Rfl: 0 .  sacubitril-valsartan (ENTRESTO) 97-103  MG, Take 1 tablet by mouth 2 (two) times daily., Disp: 60 tablet, Rfl: 11 .  spironolactone (ALDACTONE) 25 MG tablet, TAKE 1 TABLET BY MOUTH EVERY DAY, Disp: 30 tablet, Rfl: 8 .  traZODone (DESYREL) 50 MG tablet, Take 1 tablet (50 mg total) by mouth at bedtime as needed for sleep., Disp: 30 tablet, Rfl: 0 .  Turmeric 500 MG CAPS, Take 500 mg by mouth daily., Disp: , Rfl:  .  UNABLE TO FIND, CPAP, Disp: , Rfl:  .  venlafaxine XR (EFFEXOR-XR) 75 MG 24 hr capsule, Take 225 mg by mouth daily with breakfast., Disp: , Rfl:  .  VICTOZA 18 MG/3ML SOPN, Inject 0.3 mLs (1.8 mg total) into the skin daily., Disp: 27 mL, Rfl: 0 .  vitamin B-12  (CYANOCOBALAMIN) 500 MCG tablet, Take 500 mcg by mouth daily., Disp: , Rfl:  .  Vitamin D, Ergocalciferol, (DRISDOL) 50000 units CAPS capsule, Take 1 capsule (50,000 Units total) by mouth every 7 (seven) days., Disp: 12 capsule, Rfl: 0  Past Medical History: Past Medical History:  Diagnosis Date  . AICD (automatic cardioverter/defibrillator) present   . Anxiety   . Arthritis    right shoulder   . Asthma   . CHF (congestive heart failure) ([place])   . Depression   . Diabetes mellitus without complication ([place])   . Dyspnea    comes and goes intermittently mostly with exertion   . Fibroid    age 50  . Gallstones   . Hypertension   . IBS (irritable bowel syndrome)   . Migraine    "monthly" (08/03/2016)  . OSA on CPAP   . Ovarian cyst    1999; surgically removed  . 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 Seizures ([place])    "as a child" (08/03/2016)  . Termination of pregnancy    due to cardiac risk    Tobacco Use: History  Smoking Status  . Never Smoker  Smokeless Tobacco  . Never Used    Labs: Recent Review Flowsheet Data    Labs for ITP Cardiac and Pulmonary Rehab Latest Ref Rng & Units 10/15/2014 11/09/2015 11/11/2015 11/11/2015 05/28/2016   Cholestrol 0 - 200 mg/dL 145 160 - - -   LDLCALC 0 - 99 mg/dL 85 88 - - -   HDL >40 mg/dL 46.50 58 - - -   Trlycerides <150 mg/dL 70.0 70 - - -   Hemoglobin A1c 4.6 - 6.5 % - 5.4 - - 5.3   PHART 7.350 - 7.450 - - - 7.372 -   PCO2ART 35.0 - 45.0 mmHg - - - 43.0 -   HCO3 20.0 - 24.0 mEq/L - - 26.7(H) 25.0(H) -   TCO2 0 - 100 mmol/L - - 28 26 -   O2SAT % - - 62.0 90.0 -      Capillary Blood Glucose: Lab Results  Component Value Date   GLUCAP 105 (H) 12/02/2016   GLUCAP 113 (H) 11/06/2016   GLUCAP 85 10/12/2016   GLUCAP 97 10/02/2016   GLUCAP 112 (H) 09/23/2016     Exercise Target Goals:    Exercise Program Goal: Individual exercise prescription set with THRR, safety & activity barriers. Participant  demonstrates ability to understand and report RPE using BORG scale, to self-measure pulse accurately, and to acknowledge the importance of the exercise prescription.  Exercise Prescription Goal: Starting with aerobic activity 30 plus minutes a day, 3 days per week for initial exercise prescription. Provide home exercise prescription  and guidelines that participant acknowledges understanding prior to discharge.  Activity Barriers & Risk Stratification:     Activity Barriers & Cardiac Risk Stratification - 09/10/16 1134      Activity Barriers & Cardiac Risk Stratification   Activity Barriers Deconditioning;Muscular Weakness;Other (comment);Incisional Pain   Comments R knee pain and R shoulder bursitis   Cardiac Risk Stratification High      6 Minute Walk:     6 Minute Walk    Row Name 09/10/16 1134 09/10/16 1138       6 Minute Walk   Phase Initial  -    Distance 1562 feet  -    Walk Time 6 minutes  -    # of Rest Breaks 0  -    MPH  - 2.96    METS  - 4.65    RPE 11  -    VO2 Peak  - 16.27    Symptoms No  -    Resting HR 97 bpm  -    Resting BP 133/86  -    Max Ex. HR 127 bpm  -    Max Ex. BP 153/96  -    2 Minute Post BP 139/97  -       Oxygen Initial Assessment:   Oxygen Re-Evaluation:   Oxygen Discharge (Final Oxygen Re-Evaluation):   Initial Exercise Prescription:     Initial Exercise Prescription - 09/10/16 1200      Date of Initial Exercise RX and Referring Provider   Date 09/10/16   Referring Provider Glori Bickers MD     Treadmill   MPH 2.4   Grade 1   Minutes 10   METs 3.08     Recumbant Bike   Level 2   Minutes 10   METs 2     NuStep   Level 3   SPM 70   Minutes 10   METs 2     Intensity   THRR 40-80% of Max Heartrate 71-142   Ratings of Perceived Exertion 11-13   Perceived Dyspnea 0-4     Progression   Progression Continue to progress workloads to maintain intensity without signs/symptoms of physical distress.      Resistance Training   Training Prescription Yes   Weight 1lb   Reps 10-15      Perform Capillary Blood Glucose checks as needed.  Exercise Prescription Changes:     Exercise Prescription Changes    Row Name 09/16/16 1600 09/30/16 1100 10/13/16 1500 11/09/16 1500 11/20/16 1517     Response to Exercise   Blood Pressure (Admit) 126/80 136/85 110/70 118/60 104/65   Blood Pressure (Exercise) 104/76 116/80 140/80 144/74 112/80   Blood Pressure (Exit) 112/62 103/65 114/70 112/68 102/60   Heart Rate (Admit) 91 bpm 92 bpm 99 bpm 99 bpm 93 bpm   Heart Rate (Exercise) 129 bpm 132 bpm 142 bpm 130 bpm 113 bpm   Heart Rate (Exit) 100 bpm 98 bpm 97 bpm 109 bpm 87 bpm   Rating of Perceived Exertion (Exercise) 13 13 15 12 14    Symptoms  -  -  - pt reported R sided gluteal/piriformis pain  -   Comments pt was oriented to exercise equipment and responded well to exercise pt was oriented to exercise equipment and responded well to exercise  - Guided pt through stretches to help with gluteal pain/discomfort  -   Duration Continue with 30 min of aerobic exercise without signs/symptoms of physical distress. Continue with 30  min of aerobic exercise without signs/symptoms of physical distress. Continue with 30 min of aerobic exercise without signs/symptoms of physical distress. Continue with 30 min of aerobic exercise without signs/symptoms of physical distress. Continue with 30 min of aerobic exercise without signs/symptoms of physical distress.   Intensity THRR unchanged THRR unchanged THRR unchanged THRR unchanged THRR unchanged     Progression   Progression Continue to progress workloads to maintain intensity without signs/symptoms of physical distress. Continue to progress workloads to maintain intensity without signs/symptoms of physical distress. Continue to progress workloads to maintain intensity without signs/symptoms of physical distress. Continue to progress workloads to maintain intensity without  signs/symptoms of physical distress. Continue to progress workloads to maintain intensity without signs/symptoms of physical distress.   Average METs  - 3.3 2.2 2.5 3.2     Resistance Training   Training Prescription Yes Yes Yes Yes Yes   Weight 1lbs 3lbs 3lbs 5lbs 5lbs   Reps 10-15 10-15 10-15 10-15 10-15   Time 10 Minutes 10 Minutes 10 Minutes 10 Minutes 10 Minutes     Treadmill   MPH 2.4  -  - 2.4 2.5   Grade 1  -  - 1 3   Minutes 10  -  - 15 15   METs 3.08  -  - 3.08 3.95     Recumbant Bike   Level 2 2.5 3.5  - 3.5  upright scifit   Minutes 10 15 15   - 15   METs 2 4 1.7  - 2.5     NuStep   Level 3 4 4 4 5    SPM 80 90 90 80  decr. SPM due to gluteal pain 90   Minutes 10 15 15 15 15    METs 2 2.6 2.5 1.9 3.1     Home Exercise Plan   Plans to continue exercise at  - Home (comment)  walking Home (comment)  walking Home (comment)  walking Home (comment)  walking   Frequency  - Add 2 additional days to program exercise sessions. Add 2 additional days to program exercise sessions. Add 2 additional days to program exercise sessions. Add 2 additional days to program exercise sessions.   Initial Home Exercises Provided  - 09/25/16 09/25/16 09/25/16 09/25/16   Row Name 12/08/16 1500             Response to Exercise   Blood Pressure (Admit) 126/60       Blood Pressure (Exercise) 120/80       Blood Pressure (Exit) 108/64       Heart Rate (Admit) 94 bpm       Heart Rate (Exercise) 110 bpm       Heart Rate (Exit) 96 bpm       Rating of Perceived Exertion (Exercise) 11       Duration Continue with 30 min of aerobic exercise without signs/symptoms of physical distress.       Intensity THRR unchanged         Progression   Progression Continue to progress workloads to maintain intensity without signs/symptoms of physical distress.       Average METs 3.4         Resistance Training   Training Prescription Yes       Weight 5lbs       Reps 10-15       Time 10 Minutes          Treadmill   MPH 2.3  Grade 3       Minutes 15       METs 3.95         NuStep   Level 5       SPM 90       Minutes 15       METs 2.9         Home Exercise Plan   Plans to continue exercise at Home (comment)  walking       Frequency Add 2 additional days to program exercise sessions.       Initial Home Exercises Provided 09/25/16          Exercise Comments:     Exercise Comments    Row Name 09/16/16 1612 10/13/16 1519 11/09/16 1520 12/08/16 1520     Exercise Comments Pt completed first session of cardiac rehab today. Pt exercise for minutes without signs/symptoms of CP, dizziness and SOB Reviewed METs and goals. Pt is tolerating exercise very well; will continue to monitor exercise progression. Reviewed METs and goals. Pt is tolerating exercise very well; will continue to monitor exercise progression. Reviewed METs and goals. Pt is tolerating exercise very well; will continue to monitor exercise progression.       Exercise Goals and Review:     Exercise Goals    Row Name 09/10/16 0177 09/10/16 1156           Exercise Goals   Increase Physical Activity (P)  Yes Yes      Intervention (P)  Provide advice, education, support and counseling about physical activity/exercise needs.;Develop an individualized exercise prescription for aerobic and resistive training based on initial evaluation findings, risk stratification, comorbidities and participant's personal goals. Provide advice, education, support and counseling about physical activity/exercise needs.;Develop an individualized exercise prescription for aerobic and resistive training based on initial evaluation findings, risk stratification, comorbidities and participant's personal goals.      Expected Outcomes (P)  Achievement of increased cardiorespiratory fitness and enhanced flexibility, muscular endurance and strength shown through measurements of functional capacity and personal statement of participant. Achievement  of increased cardiorespiratory fitness and enhanced flexibility, muscular endurance and strength shown through measurements of functional capacity and personal statement of participant.      Increase Strength and Stamina (P)  Yes Yes      Intervention (P)  Develop an individualized exercise prescription for aerobic and resistive training based on initial evaluation findings, risk stratification, comorbidities and participant's personal goals.;Provide advice, education, support and counseling about physical activity/exercise needs. Provide advice, education, support and counseling about physical activity/exercise needs.;Develop an individualized exercise prescription for aerobic and resistive training based on initial evaluation findings, risk stratification, comorbidities and participant's personal goals.      Expected Outcomes (P)  Achievement of increased cardiorespiratory fitness and enhanced flexibility, muscular endurance and strength shown through measurements of functional capacity and personal statement of participant. Achievement of increased cardiorespiratory fitness and enhanced flexibility, muscular endurance and strength shown through measurements of functional capacity and personal statement of participant.         Exercise Goals Re-Evaluation :     Exercise Goals Re-Evaluation    Row Name 09/25/16 1602 10/13/16 1519 11/09/16 1519 12/08/16 1520       Exercise Goal Re-Evaluation   Exercise Goals Review Increase Physical Activity;Increase Strenth and Stamina Increase Physical Activity;Increase Strenth and Stamina Increase Physical Activity;Increase Strenth and Stamina  -    Comments Reviewed home exercise with pt today.  Pt plans to walk for exercise. Encouraged  150 minutes of aerobic activities per week. Pt plans to walk 2 additional days per week outside of coming to cardiac rehab. Reviewed THR, pulse, RPE, sign and symptoms, and when to call 911 or MD.  Also discussed weather  considerations and indoor options.  Pt voiced understanding. 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 pt stated she is maintaining but is limited by IBS. Encouraged pt to exercise/walk on "feel good" days and to do what she can. Discussed the importance of exercise and hydration and even consulting with MD if symptoms worsen.    Expected Outcomes Pt will be compliant with HEP and be able to exercise without difficulty. Pt will continue to improve in aerobic and functional fitness Pt will continue to improve in aerobic and functional fitness Pt will continue to improve in aerobic and functional fitness        Discharge Exercise Prescription (Final Exercise Prescription Changes):     Exercise Prescription Changes - 12/08/16 1500      Response to Exercise   Blood Pressure (Admit) 126/60   Blood Pressure (Exercise) 120/80   Blood Pressure (Exit) 108/64   Heart Rate (Admit) 94 bpm   Heart Rate (Exercise) 110 bpm   Heart Rate (Exit) 96 bpm   Rating of Perceived Exertion (Exercise) 11   Duration Continue with 30 min of aerobic exercise without signs/symptoms of physical distress.   Intensity THRR unchanged     Progression   Progression Continue to progress workloads to maintain intensity without signs/symptoms of physical distress.   Average METs 3.4     Resistance Training   Training Prescription Yes   Weight 5lbs   Reps 10-15   Time 10 Minutes     Treadmill   MPH 2.3   Grade 3   Minutes 15   METs 3.95     NuStep   Level 5   SPM 90   Minutes 15   METs 2.9     Home Exercise Plan   Plans to continue exercise at Home (comment)  walking   Frequency Add 2 additional days to program exercise sessions.   Initial Home Exercises Provided 09/25/16      Nutrition:  Target Goals: Understanding of nutrition guidelines, daily intake of sodium 1500mg ,  cholesterol 200mg , calories 30% from fat and 7% or less from saturated fats, daily to have 5 or more servings of fruits and vegetables.  Biometrics:     Pre Biometrics - 09/10/16 1139      Pre Biometrics   Waist Circumference 43 inches   Hip Circumference 51 inches   Waist to Hip Ratio 0.84 %   Triceps Skinfold 58 mm   % Body Fat 50 %   Grip Strength 36 kg   Flexibility 14 in   Single Leg Stand 17.75 seconds       Nutrition Therapy Plan and Nutrition Goals:   Nutrition Discharge: Nutrition Scores:   Nutrition Goals Re-Evaluation:   Nutrition Goals Re-Evaluation:   Nutrition Goals Discharge (Final Nutrition Goals Re-Evaluation):   Psychosocial: Target Goals: Acknowledge presence or absence of significant depression and/or stress, maximize coping skills, provide positive support system. Participant is able to verbalize types and ability to use techniques and skills needed for reducing stress and depression.  Initial Review & Psychosocial Screening:     Initial Psych Review & Screening -  09/10/16 1605      Initial Review   Current issues with Current Stress Concerns;Current Anxiety/Panic   Source of Stress Concerns Chronic Illness;Unable to participate in former interests or hobbies;Family   Comments Pt has young daughter and wonders with her current health issues what the future will be.     Family Dynamics   Good Support System? Yes     Barriers   Psychosocial barriers to participate in program The patient should benefit from training in stress management and relaxation.     Screening Interventions   Interventions Encouraged to exercise      Quality of Life Scores:     Quality of Life - 09/23/16 1631      Quality of Life Scores   Health/Function Pre 5.77 %  pt QOL severely altered by her physical disablity and social isolation from recent cardiac events.  health related anxiety and pt symptoms make it difficult for her to engage in day to day  activities. pt is encouraged to participate in CR to decrease sx.   Socioeconomic Pre 16.86 %   Psych/Spiritual Pre 9.57 %   Family Pre 26.1 %   GLOBAL Pre 11.82 %  pt admits to feelings of helplessness and hopelessness. pt denies suicidal tendancies and reports improvement in her perspective and outlook recently.  pt is currenlty under psychiatric care seeking new counselor. list of area counselors provided to her       PHQ-9: Recent Review Flowsheet Data    Depression screen [facility] 2/9 09/16/2016 02/16/2015 12/27/2013   Decreased Interest 2 0 3   Down, Depressed, Hopeless 2 0 3   PHQ - 2 Score 4 0 6   Altered sleeping 2 - 1   Tired, decreased energy 2 - 1   Change in appetite 2 - 0   Feeling bad or failure about yourself  2 - 3   Trouble concentrating 2 - 3   Moving slowly or fidgety/restless 0 - 3   Suicidal thoughts 0 - 0   PHQ-9 Score 14 - 17   Difficult doing work/chores Very difficult - -     Interpretation of Total Score  Total Score Depression Severity:  1-4 = Minimal depression, 5-9 = Mild depression, 10-14 = Moderate depression, 15-19 = Moderately severe depression, 20-27 = Severe depression   Psychosocial Evaluation and Intervention:     Psychosocial Evaluation - 09/16/16 1642      Psychosocial Evaluation & Interventions   Interventions Therapist referral;Physician referral;Stress management education;Relaxation education;Encouraged to exercise with the program and follow exercise prescription   Comments pt is currently being treated by psychiatrist however she is looking additional counseling. offered to schedule appt, however she would like to discuss this possiblity with her psychiatrist first.     Expected Outcomes pt will continue current depression/ anxiety treatment to develop effective coping skills and positive outlook.    Continue Psychosocial Services  Follow up required by staff      Psychosocial Re-Evaluation:     Psychosocial Re-Evaluation    [place] Name  10/09/16 1104 11/10/16 1630 12/02/16 1123         Psychosocial Re-Evaluation   Current issues with Current Depression;Current Anxiety/Panic;Current Stress Concerns Current Depression;Current Anxiety/Panic;Current Stress Concerns Current Depression;Current Anxiety/Panic;Current Stress Concerns     Comments pt with known depression history and health related anxiety exhibits good coping skills and decreased depression symptoms.   pt with known depression history and health related anxiety exhibits good coping skills and decreased depression symptoms.  pt with known depression history and health related anxiety exhibits good coping skills and decreased depression symptoms.  pt admits to feeling more confident with her funcational abilitites. she proudly claims walking her dogs daily, taking longer walks on non CR days and parking futher away to get more steps.  she also reports stairs are easier to use.       Expected Outcomes pt will exhibit positive outlook with good coping skills.  pt will exhibit positive outlook with good coping skills.  pt will exhibit positive outlook with good coping skills.      Interventions Stress management education;Relaxation education;Encouraged to attend Cardiac Rehabilitation for the exercise;Therapist referral Stress management education;Relaxation education;Encouraged to attend Cardiac Rehabilitation for the exercise;Therapist referral Stress management education;Relaxation education;Encouraged to attend Cardiac Rehabilitation for the exercise;Therapist referral     Continue Psychosocial Services  Follow up required by staff Follow up required by staff Follow up required by staff        Psychosocial Discharge (Final Psychosocial Re-Evaluation):     Psychosocial Re-Evaluation - 12/02/16 1123      Psychosocial Re-Evaluation   Current issues with Current Depression;Current Anxiety/Panic;Current Stress Concerns   Comments pt with known depression history and health  related anxiety exhibits good coping skills and decreased depression symptoms.  pt admits to feeling more confident with her funcational abilitites. she proudly claims walking her dogs daily, taking longer walks on non CR days and parking futher away to get more steps.  she also reports stairs are easier to use.     Expected Outcomes pt will exhibit positive outlook with good coping skills.    Interventions Stress management education;Relaxation education;Encouraged to attend Cardiac Rehabilitation for the exercise;Therapist referral   Continue Psychosocial Services  Follow up required by staff      Vocational Rehabilitation: Provide vocational rehab assistance to qualifying candidates.   Vocational Rehab Evaluation & Intervention:     Vocational Rehab - 09/10/16 1606      Initial Vocational Rehab Evaluation & Intervention   Assessment shows need for Vocational Rehabilitation No      Education: Education Goals: Education classes will be provided on a weekly basis, covering required topics. Participant will state understanding/return demonstration of topics presented.  Learning Barriers/Preferences:     Learning Barriers/Preferences - 09/10/16 3151      Learning Barriers/Preferences   Learning Barriers Sight   Learning Preferences Written Material      Education Topics: Count Your Pulse:  -Group instruction provided by verbal instruction, demonstration, patient participation and written materials to support subject.  Instructors address importance of being able to find your pulse and how to count your pulse when at home without a heart monitor.  Patients get hands on experience counting their pulse with staff help and individually.   CARDIAC REHAB PHASE II EXERCISE from 10/02/2016 in [place]  Date  09/25/16  Instruction Review Code  2- meets goals/outcomes      Heart Attack, Angina, and Risk Factor Modification:  -Group instruction provided  by verbal instruction, video, and written materials to support subject.  Instructors address signs and symptoms of angina and heart attacks.    Also discuss risk factors for heart disease and how to make changes to improve heart health risk factors.   Functional Fitness:  -Group instruction provided by verbal instruction, demonstration, patient participation, and written materials to support subject.  Instructors address safety measures for doing things around the house.  Discuss how to get up  and down off the floor, how to pick things up properly, how to safely get out of a chair without assistance, and balance training.   Meditation and Mindfulness:  -Group instruction provided by verbal instruction, patient participation, and written materials to support subject.  Instructor addresses importance of mindfulness and meditation practice to help reduce stress and improve awareness.  Instructor also leads participants through a meditation exercise.    Stretching for Flexibility and Mobility:  -Group instruction provided by verbal instruction, patient participation, and written materials to support subject.  Instructors lead participants through series of stretches that are designed to increase flexibility thus improving mobility.  These stretches are additional exercise for major muscle groups that are typically performed during regular warm up and cool down.   Hands Only CPR:  -Group verbal, video, and participation provides a basic overview of AHA guidelines for community CPR. Role-play of emergencies allow participants the opportunity to practice calling for help and chest compression technique with discussion of AED use.   Hypertension: -Group verbal and written instruction that provides a basic overview of hypertension including the most recent diagnostic guidelines, risk factor reduction with self-care instructions and medication management.    Nutrition I class: Heart Healthy Eating:   -Group instruction provided by PowerPoint slides, verbal discussion, and written materials to support subject matter. The instructor gives an explanation and review of the Therapeutic Lifestyle Changes diet recommendations, which includes a discussion on lipid goals, dietary fat, sodium, fiber, plant stanol/sterol esters, sugar, and the components of a well-balanced, healthy diet.   Nutrition II class: Lifestyle Skills:  -Group instruction provided by PowerPoint slides, verbal discussion, and written materials to support subject matter. The instructor gives an explanation and review of label reading, grocery shopping for heart health, heart healthy recipe modifications, and ways to make healthier choices when eating out.   Diabetes Question & Answer:  -Group instruction provided by PowerPoint slides, verbal discussion, and written materials to support subject matter. The instructor gives an explanation and review of diabetes co-morbidities, pre- and post-prandial blood glucose goals, pre-exercise blood glucose goals, signs, symptoms, and treatment of hypoglycemia and hyperglycemia, and foot care basics.   CARDIAC REHAB PHASE II EXERCISE from 10/02/2016 in [place]  Date  09/25/16  Educator  RD  Instruction Review Code  2- meets goals/outcomes      Diabetes Blitz:  -Group instruction provided by PowerPoint slides, verbal discussion, and written materials to support subject matter. The instructor gives an explanation and review of the physiology behind type 1 and type 2 diabetes, diabetes medications and rational behind using different medications, pre- and post-prandial blood glucose recommendations and Hemoglobin A1c goals, diabetes diet, and exercise including blood glucose guidelines for exercising safely.    Portion Distortion:  -Group instruction provided by PowerPoint slides, verbal discussion, written materials, and food models to support subject matter.  The instructor gives an explanation of serving size versus portion size, changes in portions sizes over the last 20 years, and what consists of a serving from each food group.   CARDIAC REHAB PHASE II EXERCISE from 10/02/2016 in [place]  Date  09/17/16  Educator  RD  Instruction Review Code  2- meets goals/outcomes      Stress Management:  -Group instruction provided by verbal instruction, video, and written materials to support subject matter.  Instructors review role of stress in heart disease and how to cope with stress positively.     CARDIAC  REHAB PHASE II EXERCISE from 10/02/2016 in [place]  Date  09/23/16  Instruction Review Code  2- meets goals/outcomes      Exercising on Your Own:  -Group instruction provided by verbal instruction, power point, and written materials to support subject.  Instructors discuss benefits of exercise, components of exercise, frequency and intensity of exercise, and end points for exercise.  Also discuss use of nitroglycerin and activating EMS.  Review options of places to exercise outside of rehab.  Review guidelines for sex with heart disease.   Cardiac Drugs I:  -Group instruction provided by verbal instruction and written materials to support subject.  Instructor reviews cardiac drug classes: antiplatelets, anticoagulants, beta blockers, and statins.  Instructor discusses reasons, side effects, and lifestyle considerations for each drug class.   Cardiac Drugs II:  -Group instruction provided by verbal instruction and written materials to support subject.  Instructor reviews cardiac drug classes: angiotensin converting enzyme inhibitors (ACE-I), angiotensin II receptor blockers (ARBs), nitrates, and calcium channel blockers.  Instructor discusses reasons, side effects, and lifestyle considerations for each drug class.   Anatomy and Physiology of the Circulatory System:  Group verbal  and written instruction and models provide basic cardiac anatomy and physiology, with the coronary electrical and arterial systems. Review of: AMI, Angina, Valve disease, Heart Failure, Peripheral Artery Disease, Cardiac Arrhythmia, Pacemakers, and the ICD.   Other Education:  -Group or individual verbal, written, or video instructions that support the educational goals of the cardiac rehab program.   Knowledge Questionnaire Score:     Knowledge Questionnaire Score - 09/10/16 1133      Knowledge Questionnaire Score   Pre Score 23/24      Core Components/Risk Factors/Patient Goals at Admission:     Personal Goals and Risk Factors at Admission - 09/10/16 1139      Core Components/Risk Factors/Patient Goals on Admission    Weight Management Yes;Obesity;Weight Maintenance;Weight Loss   Intervention Weight Management/Obesity: Establish reasonable short term and long term weight goals.;Weight Management: Provide education and appropriate resources to help participant work on and attain dietary goals.;Weight Management: Develop a combined nutrition and exercise program designed to reach desired caloric intake, while maintaining appropriate intake of nutrient and fiber, sodium and fats, and appropriate energy expenditure required for the weight goal.;Obesity: Provide education and appropriate resources to help participant work on and attain dietary goals.   Expected Outcomes Short Term: Continue to assess and modify interventions until short term weight is achieved;Long Term: Adherence to nutrition and physical activity/exercise program aimed toward attainment of established weight goal;Weight Maintenance: Understanding of the daily nutrition guidelines, which includes 25-35% calories from fat, 7% or less cal from saturated fats, less than 200mg  cholesterol, less than 1.5gm of sodium, & 5 or more servings of fruits and vegetables daily;Weight Loss: Understanding of general recommendations for a  balanced deficit meal plan, which promotes 1-2 lb weight loss per week and includes a negative energy balance of [phone] kcal/d;Understanding recommendations for meals to include 15-35% energy as protein, 25-35% energy from fat, 35-60% energy from carbohydrates, less than 200mg  of dietary cholesterol, 20-35 gm of total fiber daily;Understanding of distribution of calorie intake throughout the day with the consumption of 4-5 meals/snacks   Diabetes Yes   Intervention Provide education about signs/symptoms and action to take for hypo/hyperglycemia.;Provide education about proper nutrition, including hydration, and aerobic/resistive exercise prescription along with prescribed medications to achieve blood glucose in normal ranges: Fasting glucose 65-99 mg/dL   Expected Outcomes  Short Term: Participant verbalizes understanding of the signs/symptoms and immediate care of hyper/hypoglycemia, proper foot care and importance of medication, aerobic/resistive exercise and nutrition plan for blood glucose control.;Long Term: Attainment of HbA1C < 7%.   Heart Failure Yes   Intervention Provide a combined exercise and nutrition program that is supplemented with education, support and counseling about heart failure. Directed toward relieving symptoms such as shortness of breath, decreased exercise tolerance, and extremity edema.   Expected Outcomes Improve functional capacity of life;Short term: Attendance in program 2-3 days a week with increased exercise capacity. Reported lower sodium intake. Reported increased fruit and vegetable intake. Reports medication compliance.;Short term: Daily weights obtained and reported for increase. Utilizing diuretic protocols set by physician.;Long term: Adoption of self-care skills and reduction of barriers for early signs and symptoms recognition and intervention leading to self-care maintenance.   Hypertension Yes   Intervention Provide education on lifestyle modifcations including  regular physical activity/exercise, weight management, moderate sodium restriction and increased consumption of fresh fruit, vegetables, and low fat dairy, alcohol moderation, and smoking cessation.;Monitor prescription use compliance.   Expected Outcomes Short Term: Continued assessment and intervention until BP is < 140/50mm HG in hypertensive participants. < 130/66mm HG in hypertensive participants with diabetes, heart failure or chronic kidney disease.;Long Term: Maintenance of blood pressure at goal levels.   Stress Yes   Intervention Offer individual and/or small group education and counseling on adjustment to heart disease, stress management and health-related lifestyle change. Teach and support self-help strategies.;Refer participants experiencing significant psychosocial distress to appropriate mental health specialists for further evaluation and treatment. When possible, include family members and significant others in education/counseling sessions.   Expected Outcomes Short Term: Participant demonstrates changes in health-related behavior, relaxation and other stress management skills, ability to obtain effective social support, and compliance with psychotropic medications if prescribed.;Long Term: Emotional wellbeing is indicated by absence of clinically significant psychosocial distress or social isolation.      Core Components/Risk Factors/Patient Goals Review:      Goals and Risk Factor Review    Row Name 10/09/16 1101 11/10/16 1629 12/02/16 1123         Core Components/Risk Factors/Patient Goals Review   Personal Goals Review Weight Management/Obesity;Diabetes;Heart Failure;Hypertension;Stress Weight Management/Obesity;Diabetes;Heart Failure;Hypertension;Stress Weight Management/Obesity;Diabetes;Heart Failure;Hypertension;Stress     Review pt with multiple risk factors demonstrates eagerness to participate in CR activities.  pt is encouraged by her increased strength/stamina which  allowed to participate in social outings. pt congratulated on this success.  pt demonstrates good diabetes, heart failure and hypertension self care practices.   pt with multiple risk factors demonstrates eagerness to participate in CR activities.  pt demonstrates good diabetes, heart failure and hypertension self care practices.   pt with multiple risk factors demonstrates eagerness to participate in CR activities.  pt demonstrates good diabetes, heart failure and hypertension self care practices.       Expected Outcomes t will participate in CR exercise, nutrition and lifestyle education opportunities to decrease overall CAD risk factors.  t will participate in CR exercise, nutrition and lifestyle education opportunities to decrease overall CAD risk factors.  t will participate in CR exercise, nutrition and lifestyle education opportunities to decrease overall CAD risk factors.         Core Components/Risk Factors/Patient Goals at Discharge (Final Review):      Goals and Risk Factor Review - 12/02/16 1123      Core Components/Risk Factors/Patient Goals Review   Personal Goals Review Weight Management/Obesity;Diabetes;Heart Failure;Hypertension;Stress  Review pt with multiple risk factors demonstrates eagerness to participate in CR activities.  pt demonstrates good diabetes, heart failure and hypertension self care practices.     Expected Outcomes t will participate in CR exercise, nutrition and lifestyle education opportunities to decrease overall CAD risk factors.       ITP Comments:     ITP Comments    Row Name 09/10/16 1053 11/10/16 1629 12/08/16 1707       ITP Comments Medical Director, Dr. Fransico Him  Medical Director, Dr. Fransico Him  Medical Director, Dr. Fransico Him         Comments: Pt is making slower than expected progress toward personal goals after completing 17 sessions.  Pt with frequent absences for personal reasons.  Recommend continued exercise and life style  modification education including  stress management and relaxation techniques to decrease cardiac risk profile.

## 2016-12-08 NOTE — Progress Notes (Signed)
EPIC Encounter for ICM Monitoring  Patient Name: [name] is a 41 y.o. female Date: 12/08/2016 Primary Care Physican: [facility], [place] Primary Cardiologist: Smith/Bensimhon Electrophysiologist: Caryl Comes Dry Weight:  Last ICM weight 232 lbs        Attempted call to patient and unable to reach.  Left message to return call.  Transmission reviewed.    Prescribed dosage: Furosemide 80 mg 1 tablet twice a day.  Potassium 10 mEq 1 tablet daily  Labs: 10/07/2016 Creatinine 0.88, BUN 7,   Potassium 4.1, Sodium 137, EGFR >60 07/30/2016 Creatinine 0.95, BUN 13, Potassium 3.8, Sodium 138, EGFR 74-85  06/24/2016 Creatinine 0.93, BUN 8,   Potassium 3.5, Sodium 138, EGFR >60  06/22/2016 Creatinine 0.77, BUN 6,   Potassium 3.9, Sodium 141, EGFR 96-110  05/28/2016 Creatinine 0.81, BUN 7,   Potassium 3.4, Sodium 139  Recommendations: NONE - Unable to reach patient   Follow-up plan: ICM clinic phone appointment on 12/24/2016.  Office appointment scheduled 12/15/2016 with Dr. Tamala Julian.  Copy of ICM check sent to primary cardiologist and device physician.   Implanted Device Measures                  Most Recent Daily Measurement: 12/07/2016  [employer] Heart Failure Index:   28 Sleep Incline:     15 degrees Respiratory Rate:     19.3 rpm Activity Level:     1.9 hour(s) Mean Heart Rate:    89 bpm         [employer] HF index decreased from 45 on 11/24/16 to 28 on 7/16    8 Day Trend Data   3 Month Trend

## 2016-12-08 NOTE — Telephone Encounter (Signed)
Remote ICM transmission received.  Attempted patient call and left message to return call.   

## 2016-12-09 ENCOUNTER — Encounter (HOSPITAL_COMMUNITY)
Admission: RE | Admit: 2016-12-09 | Discharge: 2016-12-09 | Disposition: A | Discharge status: Home / Self Care | Payer: BLUE CROSS/BLUE SHIELD | Source: Ambulatory Visit | Attending: Internal Medicine | Admitting: Internal Medicine

## 2016-12-09 ENCOUNTER — Encounter (HOSPITAL_COMMUNITY): Payer: BLUE CROSS/BLUE SHIELD

## 2016-12-09 DIAGNOSIS — I428 Other cardiomyopathies: Secondary | ICD-10-CM

## 2016-12-09 DIAGNOSIS — I5042 Chronic combined systolic (congestive) and diastolic (congestive) heart failure: Secondary | ICD-10-CM | POA: Diagnosis not present

## 2016-12-11 ENCOUNTER — Encounter (HOSPITAL_COMMUNITY): Payer: BLUE CROSS/BLUE SHIELD

## 2016-12-12 ENCOUNTER — Other Ambulatory Visit: Payer: Self-pay | Admitting: Internal Medicine

## 2016-12-14 ENCOUNTER — Encounter (HOSPITAL_COMMUNITY)
Admission: RE | Admit: 2016-12-14 | Discharge: 2016-12-14 | Disposition: A | Discharge status: Home / Self Care | Payer: BLUE CROSS/BLUE SHIELD | Source: Ambulatory Visit | Attending: Internal Medicine | Admitting: Internal Medicine

## 2016-12-14 ENCOUNTER — Encounter (HOSPITAL_COMMUNITY): Payer: BLUE CROSS/BLUE SHIELD

## 2016-12-14 DIAGNOSIS — I5042 Chronic combined systolic (congestive) and diastolic (congestive) heart failure: Secondary | ICD-10-CM | POA: Diagnosis not present

## 2016-12-14 DIAGNOSIS — Z95810 Presence of automatic (implantable) cardiac defibrillator: Secondary | ICD-10-CM

## 2016-12-14 DIAGNOSIS — I428 Other cardiomyopathies: Secondary | ICD-10-CM

## 2016-12-14 HISTORY — DX: Presence of automatic (implantable) cardiac defibrillator: Z95.810

## 2016-12-14 LAB — GLUCOSE, CAPILLARY
Glucose-Capillary: 94 mg/dL (ref 65–99)
Glucose-Capillary: 98 mg/dL (ref 65–99)

## 2016-12-14 NOTE — Progress Notes (Signed)
Cardiology Office Note    Date:  12/15/2016   ID:  Megan, [name] 1975-10-30, MRN [id number]  PCP:  [place], FNP  Cardiologist: Sinclair Grooms, MD   Chief Complaint  Patient presents with  . Congestive Heart Failure    History of Present Illness:  [name] is a 41 y.o. female f/u of hypertension, diabetes mellitus, IBS, depression, anxiety, OSA on CPAP, IBS, DVT not on anticoagulants, chronic combined systolic and diastolic CHF.Recent hospitalization for chest pain led to coronary angiography which revealed normal vessels, moderate elevation in pulmonary artery pressures, and LVEF 25-30%.  She is now beyond AICD implantation. She has established and the heart failure clinic. Coronary angiography performed in June demonstrated no evidence of coronary disease. 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 She denies palpitations. No episodes of syncope. She does have intermittent swelling and weight gain despite taking her medications as recommended. This causes her to have uncertainty about activity and ability to gain quality of life. Other times she feels that the medication works too well and she is dehydrated and thirsty.   Past Medical History:  Diagnosis Date  . AICD (automatic cardioverter/defibrillator) present   . Anxiety   . Arthritis    right shoulder   . Asthma   . CHF (congestive heart failure) ([place])   . Depression   . Diabetes mellitus without complication ([place])   . Dyspnea    comes and goes intermittently mostly with exertion   . Fibroid    age 35  . Gallstones   . Hypertension   . IBS (irritable bowel syndrome)   . Migraine    "monthly" (08/03/2016)  . OSA on CPAP   . Ovarian cyst    1999; surgically removed  . 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 Seizures ([place])    "as a child" (08/03/2016)  . Termination of pregnancy    due to cardiac risk    Past Surgical  History:  Procedure Laterality Date  . CARDIAC CATHETERIZATION N/A 11/11/2015   Procedure: Right/Left Heart Cath and Coronary Angiography;  Surgeon: Troy Sine, MD;  Location: [facility];  Service: Cardiovascular;  Laterality: N/A;  . CARDIAC CATHETERIZATION  ~ 2015  . CARDIAC DEFIBRILLATOR PLACEMENT  08/03/2016  . CESAREAN SECTION  1999  . COLONOSCOPY WITH PROPOFOL N/A 04/21/2016   Procedure: COLONOSCOPY WITH PROPOFOL;  Surgeon: Jerene Bears, MD;  Location: [facility];  Service: Gastroenterology;  Laterality: N/A;  . ESOPHAGOGASTRODUODENOSCOPY (EGD) WITH PROPOFOL N/A 04/21/2016   Procedure: ESOPHAGOGASTRODUODENOSCOPY (EGD) WITH PROPOFOL;  Surgeon: Jerene Bears, MD;  Location: [facility];  Service: Gastroenterology;  Laterality: N/A;  . FOOT FRACTURE SURGERY Right ~ 2003  . FRACTURE SURGERY    . ICD IMPLANT N/A 08/03/2016   Procedure: ICD Implant;  Surgeon: Deboraha Sprang, MD;  Location: [facility];  Service: Cardiovascular;  Laterality: N/A;  . LAPAROSCOPIC CHOLECYSTECTOMY  12/2006  . LAPAROSCOPY ABDOMEN DIAGNOSTIC  2008   "cut bile duct w/gallbladder OR; had to go in later & fix leak; hospitalized for 2 months"  . LEFT HEART CATHETERIZATION WITH CORONARY ANGIOGRAM N/A 02/26/2014   Procedure: LEFT HEART CATHETERIZATION WITH CORONARY ANGIOGRAM;  Surgeon: Jettie Booze, MD;  Location: [facility];  Service: Cardiovascular;  Laterality: N/A;  . OVARIAN CYST REMOVAL Right 1999  . TUBAL LIGATION  1999    Current Medications:  Outpatient Medications Prior to Visit  Medication Sig Dispense Refill  . albuterol (PROVENTIL HFA;VENTOLIN HFA) 108 (90 Base) MCG/ACT inhaler Inhale 2 puffs into the lungs every 6 (six) hours as needed for wheezing or shortness of breath. 1 Inhaler 0  . Armodafinil (NUVIGIL) 250 MG tablet Take 250 mg by mouth daily.    . B-D ULTRAFINE III SHORT PEN 31G X 8 MM MISC USE TO TEST BLOOD SUGARS 1-2 TIMES A DAY AS NEEDED. 100 each 0  .  budesonide-formoterol (SYMBICORT) 160-4.5 MCG/ACT inhaler Inhale 2 puffs into the lungs 2 (two) times daily.    . carvedilol (COREG) 25 MG tablet Take 1 tablet (25 mg total) by mouth 2 (two) times daily with a meal. 180 tablet 3  . colestipol (COLESTID) 1 g tablet Take 2 tablets (2 g total) by mouth daily. 60 tablet 3  . cyclobenzaprine (FLEXERIL) 10 MG tablet Take 1 tablet (10 mg total) by mouth 3 (three) times daily as needed for muscle spasms. 30 tablet 0  . Diclofenac Sodium 2 % SOLN Apply 1 pump onto the skin twice daily as needed for pain.    Marland Kitchen dicyclomine (BENTYL) 20 MG tablet Take 20 mg by mouth 2 (two) times daily as needed for spasms.    . fluticasone (FLONASE) 50 MCG/ACT nasal spray Place 2 sprays into both nostrils daily as needed for allergies. 16 g 2  . gabapentin (NEURONTIN) 100 MG capsule Take 100 mg by mouth daily as needed (for pain).     . Insulin Pen Needle (NOVOFINE) 32G X 6 MM MISC Use to administer victoza every day 100 each 1  . ivabradine (CORLANOR) 5 MG TABS tablet Take 1.5 tablets (7.5 mg total) by mouth 2 (two) times daily with a meal. 90 tablet 3  . magnesium oxide (MAG-OX) 400 MG tablet Take 1 tablet (400 mg total) by mouth daily. 90 tablet 3  . ondansetron (ZOFRAN ODT) 4 MG disintegrating tablet Take 1 tablet (4 mg total) by mouth every 8 (eight) hours as needed for nausea or vomiting. 20 tablet 0  . potassium chloride (K-DUR) 10 MEQ tablet Take 10 mEq by mouth daily.    . sacubitril-valsartan (ENTRESTO) 97-103 MG Take 1 tablet by mouth 2 (two) times daily. 60 tablet 11  . spironolactone (ALDACTONE) 25 MG tablet TAKE 1 TABLET BY MOUTH EVERY DAY 30 tablet 8  . Turmeric 500 MG CAPS Take 500 mg by mouth daily.    Marland Kitchen UNABLE TO FIND CPAP    . venlafaxine XR (EFFEXOR-XR) 75 MG 24 hr capsule Take 225 mg by mouth daily with breakfast.    . VICTOZA 18 MG/3ML SOPN Inject 0.3 mLs (1.8 mg total) into the skin daily. 27 mL 0  . vitamin B-12 (CYANOCOBALAMIN) 500 MCG tablet Take 500  mcg by mouth daily.    . Vitamin D, Ergocalciferol, (DRISDOL) 50000 units CAPS capsule Take 1 capsule (50,000 Units total) by mouth every 7 (seven) days. 12 capsule 0  . furosemide (LASIX) 80 MG tablet Take 1 tablet (80 mg total) by mouth 2 (two) times daily. 60 tablet 6  . cefUROXime (CEFTIN) 500 MG tablet Take 1 tablet (500 mg total) by mouth 2 (two) times daily with a meal. (Patient not taking: Reported on 12/15/2016) 14 tablet 0  . HYDROcodone-homatropine (HYCODAN) 5-1.5 MG/5ML syrup Take 5 mLs by mouth every 8 (eight) hours as needed for cough. (Patient not taking: Reported on 12/15/2016) 120 mL 0  . promethazine-dextromethorphan (PROMETHAZINE-DM) 6.25-15 MG/5ML syrup Take 5  mLs by mouth 4 (four) times daily as needed. (Patient not taking: Reported on 12/15/2016) 118 mL 0  . traZODone (DESYREL) 50 MG tablet Take 1 tablet (50 mg total) by mouth at bedtime as needed for sleep. (Patient not taking: Reported on 12/15/2016) 30 tablet 0   No facility-administered medications prior to visit.      Allergies:   Vancomycin; Contrast media [iodinated diagnostic agents]; Aspirin; Ciprofloxacin; and Sulfa antibiotics   Social History   Social History  . Marital status: Married    Spouse name: N/A  . Number of children: 1  . Years of education: N/A   Occupational History  . stay at home mom    Social History Main Topics  . Smoking status: Never Smoker  . Smokeless tobacco: Never Used  . Alcohol use No  . Drug use: No  . Sexual activity: Yes   Other Topics Concern  . None   Social History Narrative  . None     Family History:  The patient's family history includes Allergies in her daughter; Emphysema in her maternal grandmother; Heart disease (age of onset: 45) in her maternal grandmother; Rheum arthritis in her mother.   ROS:   Please see the history of present illness.    Still some depression and anxiety. No episodes of syncope. No AICD discharge.  All other systems reviewed and are  negative.   PHYSICAL EXAM:   VS:  BP 120/90   Pulse 96   Ht 5\' 5"  (1.651 m)   Wt 236 lb (107 kg)   SpO2 99%   BMI 39.27 kg/m    GEN: Well nourished, well developed, in no acute distress  HEENT: normal  Neck: no JVD, carotid bruits, or masses Cardiac: RRR; no murmurs, rubs, or gallops,no edema  Respiratory:  clear to auscultation bilaterally, normal work of breathing GI: soft, nontender, nondistended, + BS MS: no deformity or atrophy  Skin: warm and dry, no rash Neuro:  Alert and Oriented x 3, Strength and sensation are intact Psych: euthymic mood, full affect  Wt Readings from Last 3 Encounters:  12/15/16 236 lb (107 kg)  11/20/16 233 lb 6.4 oz (105.9 kg)  11/16/16 241 lb (109.3 kg)      Studies/Labs Reviewed:   EKG:  EKG  Not repeated  Recent Labs: 05/28/2016: ALT 8; TSH 1.09 06/24/2016: B Natriuretic Peptide 36.7 07/30/2016: Hemoglobin 13.4; Platelets 231 10/07/2016: BUN 7; Creatinine, Ser 0.88; Potassium 4.1; Sodium 137   Lipid Panel    Component Value Date/Time   CHOL 160 11/09/2015 0526   TRIG 70 11/09/2015 0526   HDL 58 11/09/2015 0526   CHOLHDL 2.8 11/09/2015 0526   VLDL 14 11/09/2015 0526   LDLCALC 88 11/09/2015 0526    Additional studies/ records that were reviewed today include:  CARDIAC CATH 10/2014: Coronary Diagrams   Diagnostic Diagram           ASSESSMENT:    1. Chronic combined systolic and diastolic heart failure ([place])   2. Essential hypertension   3. Postpartum cardiomyopathy   4. Diabetes mellitus without complication ([place])   5. ICD (implantable cardioverter-defibrillator), single, in situ      PLAN:  In order of problems listed above:  1. Due to nonischemic cardiomyopathy. She is on optimal medical therapy. Because of varying weights we will do the following: Switch from furosemide 80 mg twice a day 2 torsemide 40 mg twice a day. Basic metabolic panel in one week. She will have a repeat echocardiogram  done in September. 2. Blood  pressure is excellently controlled. 3. See above. 4. A1c target less than or equal to 70. 5. Not addressed  Clinical follow-up in 6 months. Call if dyspnea, syncope, AICD discharge, side effects related to medication change.    Medication Adjustments/Labs and Tests Ordered: Current medicines are reviewed at length with the patient today.  Concerns regarding medicines are outlined above.  Medication changes, Labs and Tests ordered today are listed in the Patient Instructions below. Patient Instructions  Medication Instructions:  1) DISCONTINUE Furosemide 2) START Torsemide 40mg  (two 20mg  tablets) twice daily.    Labwork: Your physician recommends that you return for lab work 1 weeks after you start the Torsemide (BMET, BNP)   Testing/Procedures: None  Follow-Up: Your physician wants you to follow-up in: 6 months with Dr. Tamala Julian.  You will receive a reminder letter in the mail two months in advance. If you don't receive a letter, please call our office to schedule the follow-up appointment.   Any Other Special Instructions Will Be Listed Below (If Applicable).     If you need a refill on your cardiac medications before your next appointment, please call your pharmacy.      Signed, Sinclair Grooms, MD  12/15/2016 9:55 AM    [address], [place],   [address] Phone: [phone]; Fax: [phone]

## 2016-12-15 ENCOUNTER — Ambulatory Visit (INDEPENDENT_AMBULATORY_CARE_PROVIDER_SITE_OTHER): Payer: BLUE CROSS/BLUE SHIELD | Admitting: Interventional Cardiology

## 2016-12-15 ENCOUNTER — Encounter: Payer: Self-pay | Admitting: Interventional Cardiology

## 2016-12-15 VITALS — BP 120/90 | HR 96 | Ht 65.0 in | Wt 236.0 lb

## 2016-12-15 DIAGNOSIS — I10 Essential (primary) hypertension: Secondary | ICD-10-CM | POA: Diagnosis not present

## 2016-12-15 DIAGNOSIS — Z95810 Presence of automatic (implantable) cardiac defibrillator: Secondary | ICD-10-CM | POA: Diagnosis not present

## 2016-12-15 DIAGNOSIS — E119 Type 2 diabetes mellitus without complications: Secondary | ICD-10-CM

## 2016-12-15 DIAGNOSIS — I5042 Chronic combined systolic (congestive) and diastolic (congestive) heart failure: Secondary | ICD-10-CM

## 2016-12-15 DIAGNOSIS — O903 Peripartum cardiomyopathy: Secondary | ICD-10-CM | POA: Diagnosis not present

## 2016-12-15 MED ORDER — TORSEMIDE 20 MG PO TABS: 40.0000 mg | ORAL_TABLET | Freq: Two times a day (BID) | ORAL | 3 refills | Status: DC

## 2016-12-15 NOTE — Patient Instructions (Signed)
Medication Instructions:  1) DISCONTINUE Furosemide 2) START Torsemide 40mg  (two 20mg  tablets) twice daily.    Labwork: Your physician recommends that you return for lab work 1 weeks after you start the Torsemide (BMET, BNP)   Testing/Procedures: None  Follow-Up: Your physician wants you to follow-up in: 6 months with Dr. Tamala Julian.  You will receive a reminder letter in the mail two months in advance. If you don't receive a letter, please call our office to schedule the follow-up appointment.   Any Other Special Instructions Will Be Listed Below (If Applicable).     If you need a refill on your cardiac medications before your next appointment, please call your pharmacy.

## 2016-12-16 ENCOUNTER — Encounter (HOSPITAL_COMMUNITY): Payer: BLUE CROSS/BLUE SHIELD

## 2016-12-18 ENCOUNTER — Encounter (HOSPITAL_COMMUNITY)
Admission: RE | Admit: 2016-12-18 | Discharge: 2016-12-18 | Disposition: A | Discharge status: Home / Self Care | Payer: BLUE CROSS/BLUE SHIELD | Source: Ambulatory Visit | Attending: Internal Medicine | Admitting: Internal Medicine

## 2016-12-18 ENCOUNTER — Encounter (HOSPITAL_COMMUNITY): Payer: BLUE CROSS/BLUE SHIELD

## 2016-12-18 DIAGNOSIS — I5042 Chronic combined systolic (congestive) and diastolic (congestive) heart failure: Secondary | ICD-10-CM | POA: Diagnosis not present

## 2016-12-18 DIAGNOSIS — I428 Other cardiomyopathies: Secondary | ICD-10-CM

## 2016-12-21 ENCOUNTER — Encounter (HOSPITAL_COMMUNITY): Payer: BLUE CROSS/BLUE SHIELD

## 2016-12-21 ENCOUNTER — Other Ambulatory Visit: Payer: Self-pay | Admitting: Family

## 2016-12-23 ENCOUNTER — Encounter (HOSPITAL_COMMUNITY): Payer: BLUE CROSS/BLUE SHIELD

## 2016-12-23 ENCOUNTER — Telehealth (HOSPITAL_COMMUNITY): Payer: Self-pay | Admitting: Family

## 2016-12-24 ENCOUNTER — Other Ambulatory Visit: Payer: Self-pay | Admitting: Family

## 2016-12-24 ENCOUNTER — Encounter (HOSPITAL_COMMUNITY): Payer: Self-pay | Admitting: Emergency Medicine

## 2016-12-24 ENCOUNTER — Emergency Department (HOSPITAL_COMMUNITY)
Admission: EM | Admit: 2016-12-24 | Discharge: 2016-12-24 | Disposition: A | Discharge status: Home / Self Care | Payer: BLUE CROSS/BLUE SHIELD | Attending: Emergency Medicine | Admitting: Emergency Medicine

## 2016-12-24 ENCOUNTER — Ambulatory Visit (INDEPENDENT_AMBULATORY_CARE_PROVIDER_SITE_OTHER): Payer: BLUE CROSS/BLUE SHIELD

## 2016-12-24 ENCOUNTER — Telehealth: Payer: Self-pay

## 2016-12-24 DIAGNOSIS — Z79899 Other long term (current) drug therapy: Secondary | ICD-10-CM | POA: Diagnosis not present

## 2016-12-24 DIAGNOSIS — I110 Hypertensive heart disease with heart failure: Secondary | ICD-10-CM | POA: Insufficient documentation

## 2016-12-24 DIAGNOSIS — I5042 Chronic combined systolic (congestive) and diastolic (congestive) heart failure: Secondary | ICD-10-CM

## 2016-12-24 DIAGNOSIS — E119 Type 2 diabetes mellitus without complications: Secondary | ICD-10-CM | POA: Diagnosis not present

## 2016-12-24 DIAGNOSIS — J45909 Unspecified asthma, uncomplicated: Secondary | ICD-10-CM | POA: Diagnosis not present

## 2016-12-24 DIAGNOSIS — H16002 Unspecified corneal ulcer, left eye: Secondary | ICD-10-CM | POA: Diagnosis not present

## 2016-12-24 DIAGNOSIS — Z95810 Presence of automatic (implantable) cardiac defibrillator: Secondary | ICD-10-CM

## 2016-12-24 DIAGNOSIS — H5712 Ocular pain, left eye: Secondary | ICD-10-CM | POA: Diagnosis present

## 2016-12-24 MED ORDER — TETRACAINE HCL 0.5 % OP SOLN
2.0000 [drp] | Freq: Once | OPHTHALMIC | Status: AC
  Administered 2016-12-24: 2 [drp] via OPHTHALMIC
  Filled 2016-12-24: qty 4

## 2016-12-24 MED ORDER — GENTAMICIN SULFATE 0.1 % EX OINT: 1.0000 "application " | TOPICAL_OINTMENT | Freq: Three times a day (TID) | CUTANEOUS | 0 refills | Status: DC

## 2016-12-24 MED ORDER — FLUORESCEIN SODIUM 0.6 MG OP STRP
1.0000 | ORAL_STRIP | Freq: Once | OPHTHALMIC | Status: AC
  Administered 2016-12-24: 1 via OPHTHALMIC
  Filled 2016-12-24: qty 1

## 2016-12-24 MED ORDER — POLYMYXIN B-TRIMETHOPRIM 10000-0.1 UNIT/ML-% OP SOLN: 1.0000 [drp] | OPHTHALMIC | 0 refills | Status: DC

## 2016-12-24 NOTE — ED Triage Notes (Signed)
Pt reports 10/10 left eye pain that started last night. Pt states that her vision is unclear out of left eye. Pt states that the only routine change was wearing makeup and wiping her eyes. Pt states her eye seems tight, is teary, "glass-like" pain in corner of eye, light sensitive. Pt also reports difficulty removing her left contact and was concerned she got makeup in her eye. Pt's daughter removed contact.

## 2016-12-24 NOTE — Telephone Encounter (Signed)
Pt is aware that rx has been sent.

## 2016-12-24 NOTE — Telephone Encounter (Signed)
Medication sent to pharmacy  

## 2016-12-24 NOTE — Discharge Instructions (Signed)
Follow these instructions at home: If prescribed, use your antibiotic pills, eye drops, or ointment as directed. Continue using them even if you start to feel better. You may have to apply eye drops as often as every few minutes to every hour, for days. It may be necessary to set your alarm clock every few minutes to every hour during the night. This is absolutely necessary. Only take over-the-counter or prescription medicines as directed by your health care provider. Apply artificial tears as needed if you have dry eyes. Do not touch or rub your eye, because this may increase the irritation and spread the infection. Avoid wearing eye makeup. Stay in a dark room and use sunglasses if you have light sensitivity. Apply cool packs to your eye to relieve discomfort and swelling. If your eye is patched, you should not drive or use machinery. You will have reduced side vision and ability to judge distance. Do not drive or operate machinery until approved by your health care provider. Your ability to judge distances is impaired. Follow up with your health care provider as directed. Do not wear contact lenses until your health care provider approves. If you normally wear contact lenses, follow these general rules to avoid the risk of a corneal ulcer: Do not wear contact lenses while you sleep. Wash your hands before removing contact lenses. Properly sterilize and store your contact lenses. Regularly clean your contact lens case. Do not use your saliva or tap water to clean or wet your contact lenses. Remove your contact lenses if your eye becomes irritated. You may put them back in once your eyes feel better. Get help right away if: You notice a change in your vision. Your pain is getting worse, not better. You have increasing discharge from the eye.

## 2016-12-24 NOTE — Telephone Encounter (Signed)
Remote ICM transmission received.  Attempted patient call and left detailed message regarding transmission and next ICM scheduled for 01/26/2017.  Advised to return call for any fluid symptoms or questions.
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
## 2016-12-24 NOTE — Progress Notes (Signed)
EPIC Encounter for ICM Monitoring  Patient Name: [name] is a 41 y.o. female Date: 12/24/2016 Primary Care Physican: Golden Circle, Stoy Primary Cardiologist: Smith/Bensimhon Electrophysiologist: Caryl Comes Dry Weight: 234 lbs          Heart Failure questions reviewed, pt asymptomatic.   Prescribed dosage: Torsemide 20 mg 2 tablets (40 mg total) twice a day.  Potassium 10 mEq 1 tablet daily  Labs: 10/07/2016 Creatinine 0.88, BUN 7,   Potassium 4.1, Sodium 137, EGFR >60 07/30/2016 Creatinine 0.95, BUN 13, Potassium 3.8, Sodium 138, EGFR 74-85  06/24/2016 Creatinine 0.93, BUN 8,   Potassium 3.5, Sodium 138, EGFR >60  06/22/2016 Creatinine 0.77, BUN 6,   Potassium 3.9, Sodium 141, EGFR 96-110  05/28/2016 Creatinine 0.81, BUN 7,   Potassium 3.4, Sodium 139  Recommendations: No changes.  Advised to limit salt intake to 2000 mg/day and fluid intake to < 2 liters/day.  Encouraged to call for fluid symptoms.  Follow-up plan: ICM clinic phone appointment on 01/26/2017.  Office appointment scheduled 01/07/2017 with Dr. Haroldine Laws.  Copy of ICM check sent to device physician.   Implanted Device Measures                  Most Recent Daily Measurement: 12/20/2016  [employer] Heart Failure Index:   0 Sleep Incline:     30 degrees Respiratory Rate:     21.5 rpm Activity Level:     1.9 hour(s) Mean Heart Rate:    98 bpm   8 Day Trend Data  6 Month Trend

## 2016-12-24 NOTE — Telephone Encounter (Signed)
Pt went to ED yesterday for eye problem. What ED gave her was something topical and she needs an eye drop. Pharmacy will not fill it bc it was not necessary for her to use a topical ointment for a problem inside her eye. Pt has tried calling ED numerous times to get this medication changed but states she keeps getting hung up on. She is in a lot of pain and discomfort and is asking if you could please send an eye drop in to [facility] on [address] as soon as you can please.

## 2016-12-24 NOTE — ED Notes (Signed)
Eye patch offered to pt. Pt asked if she could have eye drops before placing the eye patch. RN aware.

## 2016-12-24 NOTE — ED Notes (Addendum)
Pt was not able to visualize anything on visual chart from her left eye. Pt stated it hurts and is blurry.

## 2016-12-24 NOTE — Progress Notes (Signed)
ED visit reviewed with Polytrim sent to pharmacy.

## 2016-12-25 ENCOUNTER — Encounter (HOSPITAL_COMMUNITY): Payer: BLUE CROSS/BLUE SHIELD

## 2016-12-28 ENCOUNTER — Encounter (HOSPITAL_COMMUNITY): Payer: BLUE CROSS/BLUE SHIELD

## 2016-12-30 ENCOUNTER — Encounter (HOSPITAL_COMMUNITY): Payer: BLUE CROSS/BLUE SHIELD

## 2017-01-01 ENCOUNTER — Encounter (HOSPITAL_COMMUNITY): Payer: BLUE CROSS/BLUE SHIELD

## 2017-01-04 ENCOUNTER — Encounter (HOSPITAL_COMMUNITY): Payer: BLUE CROSS/BLUE SHIELD

## 2017-01-05 ENCOUNTER — Telehealth: Payer: Self-pay

## 2017-01-05 NOTE — Telephone Encounter (Signed)
Attempted ICM call to patient due to [employer] Heart Failure Index alerted for above threshold.  Index is 21.  Left message for patient to return call.

## 2017-01-05 NOTE — Telephone Encounter (Signed)
Patient returned call.  She stated she has been having fluid symptoms of weight gain and feeling tightness in her rib cage area and some discomfort when taking a deep breath.  She took extra Torsemide x 2 days and weight decreased from 233 lbs to 226 lbs today.  She is feeling much better.  She has had some IBS symptoms for the last few days and thinks maybe the tablets are not being absorbed very well.  Advised to call if fluid symptoms return or continue.  She stated she would do so.

## 2017-01-06 ENCOUNTER — Encounter (HOSPITAL_COMMUNITY): Payer: BLUE CROSS/BLUE SHIELD

## 2017-01-06 ENCOUNTER — Encounter (HOSPITAL_COMMUNITY)
Admission: RE | Admit: 2017-01-06 | Discharge: 2017-01-06 | Disposition: A | Discharge status: Home / Self Care | Payer: BLUE CROSS/BLUE SHIELD | Source: Ambulatory Visit | Attending: Internal Medicine | Admitting: Internal Medicine

## 2017-01-06 ENCOUNTER — Ambulatory Visit: Payer: Self-pay | Admitting: Family Medicine

## 2017-01-06 DIAGNOSIS — I5042 Chronic combined systolic (congestive) and diastolic (congestive) heart failure: Secondary | ICD-10-CM | POA: Diagnosis not present

## 2017-01-06 DIAGNOSIS — I428 Other cardiomyopathies: Secondary | ICD-10-CM

## 2017-01-07 ENCOUNTER — Other Ambulatory Visit: Payer: Self-pay | Admitting: Family

## 2017-01-07 ENCOUNTER — Other Ambulatory Visit: Payer: Self-pay | Admitting: Family Medicine

## 2017-01-07 ENCOUNTER — Ambulatory Visit (HOSPITAL_BASED_OUTPATIENT_CLINIC_OR_DEPARTMENT_OTHER)
Admission: RE | Admit: 2017-01-07 | Discharge: 2017-01-07 | Disposition: A | Discharge status: Home / Self Care | Payer: BLUE CROSS/BLUE SHIELD | Source: Ambulatory Visit | Attending: Internal Medicine | Admitting: Internal Medicine

## 2017-01-07 ENCOUNTER — Ambulatory Visit (HOSPITAL_COMMUNITY)
Admission: RE | Admit: 2017-01-07 | Discharge: 2017-01-07 | Disposition: A | Discharge status: Home / Self Care | Payer: BLUE CROSS/BLUE SHIELD | Source: Ambulatory Visit | Attending: Internal Medicine | Admitting: Internal Medicine

## 2017-01-07 ENCOUNTER — Encounter (HOSPITAL_COMMUNITY): Payer: Self-pay | Admitting: Internal Medicine

## 2017-01-07 VITALS — BP 144/92 | HR 88 | Wt 232.0 lb

## 2017-01-07 DIAGNOSIS — I110 Hypertensive heart disease with heart failure: Secondary | ICD-10-CM | POA: Insufficient documentation

## 2017-01-07 DIAGNOSIS — Z79899 Other long term (current) drug therapy: Secondary | ICD-10-CM | POA: Insufficient documentation

## 2017-01-07 DIAGNOSIS — I5042 Chronic combined systolic (congestive) and diastolic (congestive) heart failure: Secondary | ICD-10-CM | POA: Diagnosis not present

## 2017-01-07 DIAGNOSIS — K589 Irritable bowel syndrome without diarrhea: Secondary | ICD-10-CM | POA: Diagnosis not present

## 2017-01-07 DIAGNOSIS — Z881 Allergy status to other antibiotic agents status: Secondary | ICD-10-CM | POA: Insufficient documentation

## 2017-01-07 DIAGNOSIS — Z9889 Other specified postprocedural states: Secondary | ICD-10-CM | POA: Diagnosis not present

## 2017-01-07 DIAGNOSIS — G4733 Obstructive sleep apnea (adult) (pediatric): Secondary | ICD-10-CM | POA: Insufficient documentation

## 2017-01-07 DIAGNOSIS — I5022 Chronic systolic (congestive) heart failure: Secondary | ICD-10-CM | POA: Diagnosis not present

## 2017-01-07 DIAGNOSIS — Z794 Long term (current) use of insulin: Secondary | ICD-10-CM | POA: Insufficient documentation

## 2017-01-07 DIAGNOSIS — Z882 Allergy status to sulfonamides status: Secondary | ICD-10-CM | POA: Insufficient documentation

## 2017-01-07 DIAGNOSIS — F329 Major depressive disorder, single episode, unspecified: Secondary | ICD-10-CM | POA: Insufficient documentation

## 2017-01-07 DIAGNOSIS — E119 Type 2 diabetes mellitus without complications: Secondary | ICD-10-CM | POA: Diagnosis not present

## 2017-01-07 DIAGNOSIS — F419 Anxiety disorder, unspecified: Secondary | ICD-10-CM | POA: Insufficient documentation

## 2017-01-07 DIAGNOSIS — I10 Essential (primary) hypertension: Secondary | ICD-10-CM

## 2017-01-07 DIAGNOSIS — Z9851 Tubal ligation status: Secondary | ICD-10-CM | POA: Insufficient documentation

## 2017-01-07 DIAGNOSIS — Z6838 Body mass index (BMI) 38.0-38.9, adult: Secondary | ICD-10-CM | POA: Insufficient documentation

## 2017-01-07 LAB — BASIC METABOLIC PANEL
Anion gap: 8 (ref 5–15)
BUN: 8 mg/dL (ref 6–20)
CO2: 29 mmol/L (ref 22–32)
Calcium: 9.2 mg/dL (ref 8.9–10.3)
Chloride: 102 mmol/L (ref 101–111)
Creatinine, Ser: 0.86 mg/dL (ref 0.44–1.00)
GFR calc Af Amer: >60 mL/min (ref >60)
GFR calc non Af Amer: >60 mL/min (ref >60)
Glucose, Bld: 98 mg/dL (ref 65–99)
Potassium: 3.4 mmol/L — ABNORMAL LOW (ref 3.5–5.1)
Sodium: 139 mmol/L (ref 135–145)

## 2017-01-07 NOTE — Progress Notes (Signed)
Cardiac Individual Treatment Plan  Patient Details  Name: [name] MRN: [id number] Date of Birth: 12-23-1975 Referring Provider:     CARDIAC REHAB PHASE II ORIENTATION from 09/10/2016 in [facility]  Referring Provider  Glori Bickers MD      Initial Encounter Date:    CARDIAC REHAB PHASE II ORIENTATION from 09/10/2016 in [facility]  Date  09/10/16  Referring Provider  Glori Bickers MD      Visit Diagnosis: NICM (nonischemic cardiomyopathy) ([place])  [name] Home Medications on Admission:  Current Outpatient Prescriptions:  .  albuterol (PROVENTIL HFA;VENTOLIN HFA) 108 (90 Base) MCG/ACT inhaler, Inhale 2 puffs into the lungs every 6 (six) hours as needed for wheezing or shortness of breath., Disp: 1 Inhaler, Rfl: 0 .  Armodafinil (NUVIGIL) 250 MG tablet, Take 250 mg by mouth daily., Disp: , Rfl:  .  B-D ULTRAFINE III SHORT PEN 31G X 8 MM MISC, USE TO TEST BLOOD SUGARS 1-2 TIMES A DAY AS NEEDED., Disp: 100 each, Rfl: 3 .  BIOTIN PO, Take 500 mg by mouth daily., Disp: , Rfl:  .  budesonide-formoterol (SYMBICORT) 160-4.5 MCG/ACT inhaler, Inhale 2 puffs into the lungs 2 (two) times daily., Disp: , Rfl:  .  carvedilol (COREG) 25 MG tablet, Take 1 tablet (25 mg total) by mouth 2 (two) times daily with a meal., Disp: 180 tablet, Rfl: 3 .  colestipol (COLESTID) 1 g tablet, Take 2 tablets (2 g total) by mouth daily., Disp: 60 tablet, Rfl: 3 .  cyclobenzaprine (FLEXERIL) 10 MG tablet, Take 1 tablet (10 mg total) by mouth 3 (three) times daily as needed for muscle spasms., Disp: 30 tablet, Rfl: 0 .  Diclofenac Sodium 2 % SOLN, Apply 1 pump onto the skin twice daily as needed for pain., Disp: , Rfl:  .  dicyclomine (BENTYL) 20 MG tablet, Take 20 mg by mouth 2 (two) times daily as needed for spasms., Disp: , Rfl:  .  fluticasone (FLONASE) 50 MCG/ACT nasal spray, PLACE 2 SPRAYS INTO BOTH NOSTRILS DAILY AS NEEDED FOR  ALLERGIES., Disp: 16 g, Rfl: 2 .  gabapentin (NEURONTIN) 100 MG capsule, Take 100 mg by mouth daily as needed (for pain). , Disp: , Rfl:  .  gentamicin ointment (GARAMYCIN) 0.1 %, Apply 1 application topically 3 (three) times daily., Disp: 30 g, Rfl: 0 .  Insulin Pen Needle (NOVOFINE) 32G X 6 MM MISC, Use to administer victoza every day, Disp: 100 each, Rfl: 1 .  ivabradine (CORLANOR) 5 MG TABS tablet, Take 1.5 tablets (7.5 mg total) by mouth 2 (two) times daily with a meal., Disp: 90 tablet, Rfl: 3 .  magnesium oxide (MAG-OX) 400 MG tablet, Take 1 tablet (400 mg total) by mouth daily., Disp: 90 tablet, Rfl: 3 .  ondansetron (ZOFRAN ODT) 4 MG disintegrating tablet, Take 1 tablet (4 mg total) by mouth every 8 (eight) hours as needed for nausea or vomiting., Disp: 20 tablet, Rfl: 0 .  potassium chloride (K-DUR) 10 MEQ tablet, Take 10 mEq by mouth daily., Disp: , Rfl:  .  sacubitril-valsartan (ENTRESTO) 97-103 MG, Take 1 tablet by mouth 2 (two) times daily., Disp: 60 tablet, Rfl: 11 .  spironolactone (ALDACTONE) 25 MG tablet, TAKE 1 TABLET BY MOUTH EVERY DAY, Disp: 30 tablet, Rfl: 8 .  torsemide (DEMADEX) 20 MG tablet, Take 2 tablets (40 mg total) by mouth 2 (two) times daily., Disp: 360 tablet, Rfl: 3 .  trimethoprim-polymyxin b (POLYTRIM) ophthalmic solution,  Place 1 drop into the left eye every 4 (four) hours., Disp: 10 mL, Rfl: 0 .  Turmeric 500 MG CAPS, Take 500 mg by mouth daily., Disp: , Rfl:  .  UNABLE TO FIND, CPAP, Disp: , Rfl:  .  venlafaxine XR (EFFEXOR-XR) 75 MG 24 hr capsule, Take 225 mg by mouth daily with breakfast., Disp: , Rfl:  .  VICTOZA 18 MG/3ML SOPN, Inject 0.3 mLs (1.8 mg total) into the skin daily., Disp: 27 mL, Rfl: 0 .  vitamin B-12 (CYANOCOBALAMIN) 500 MCG tablet, Take 500 mcg by mouth daily., Disp: , Rfl:  .  Vitamin D, Ergocalciferol, (DRISDOL) 50000 units CAPS capsule, Take 1 capsule (50,000 Units total) by mouth every 7 (seven) days., Disp: 12 capsule, Rfl: 0  Past  Medical History: Past Medical History:  Diagnosis Date  . AICD (automatic cardioverter/defibrillator) present   . Anxiety   . Arthritis    right shoulder   . Asthma   . CHF (congestive heart failure) ([place])   . Depression   . Diabetes mellitus without complication ([place])   . Dyspnea    comes and goes intermittently mostly with exertion   . Fibroid    age 60  . Gallstones   . Hypertension   . IBS (irritable bowel syndrome)   . Migraine    "monthly" (08/03/2016)  . OSA on CPAP   . Ovarian cyst    1999; surgically removed  . 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 Seizures ([place])    "as a child" (08/03/2016)  . Termination of pregnancy    due to cardiac risk    Tobacco Use: History  Smoking Status  . Never Smoker  Smokeless Tobacco  . Never Used    Labs: Recent Review Flowsheet Data    Labs for ITP Cardiac and Pulmonary Rehab Latest Ref Rng & Units 10/15/2014 11/09/2015 11/11/2015 11/11/2015 05/28/2016   Cholestrol 0 - 200 mg/dL 145 160 - - -   LDLCALC 0 - 99 mg/dL 85 88 - - -   HDL >40 mg/dL 46.50 58 - - -   Trlycerides <150 mg/dL 70.0 70 - - -   Hemoglobin A1c 4.6 - 6.5 % - 5.4 - - 5.3   PHART 7.350 - 7.450 - - - 7.372 -   PCO2ART 35.0 - 45.0 mmHg - - - 43.0 -   HCO3 20.0 - 24.0 mEq/L - - 26.7(H) 25.0(H) -   TCO2 0 - 100 mmol/L - - 28 26 -   O2SAT % - - 62.0 90.0 -      Capillary Blood Glucose: Lab Results  Component Value Date   GLUCAP 98 12/14/2016   GLUCAP 94 12/14/2016   GLUCAP 105 (H) 12/02/2016   GLUCAP 113 (H) 11/06/2016   GLUCAP 85 10/12/2016     Exercise Target Goals:    Exercise Program Goal: Individual exercise prescription set with THRR, safety & activity barriers. Participant demonstrates ability to understand and report RPE using BORG scale, to self-measure pulse accurately, and to acknowledge the importance of the exercise prescription.  Exercise Prescription Goal: Starting with aerobic activity 30 plus minutes a day, 3  days per week for initial exercise prescription. Provide home exercise prescription and guidelines that participant acknowledges understanding prior to discharge.  Activity Barriers & Risk Stratification:     Activity Barriers & Cardiac Risk Stratification - 09/10/16 1134      Activity Barriers & Cardiac Risk Stratification   Activity Barriers Deconditioning;Muscular Weakness;Other (  comment);Incisional Pain   Comments R knee pain and R shoulder bursitis   Cardiac Risk Stratification High      6 Minute Walk:     6 Minute Walk    Row Name 09/10/16 1134 09/10/16 1138       6 Minute Walk   Phase Initial  -    Distance 1562 feet  -    Walk Time 6 minutes  -    # of Rest Breaks 0  -    MPH  - 2.96    METS  - 4.65    RPE 11  -    VO2 Peak  - 16.27    Symptoms No  -    Resting HR 97 bpm  -    Resting BP 133/86  -    Max Ex. HR 127 bpm  -    Max Ex. BP 153/96  -    2 Minute Post BP 139/97  -       Oxygen Initial Assessment:   Oxygen Re-Evaluation:   Oxygen Discharge (Final Oxygen Re-Evaluation):   Initial Exercise Prescription:     Initial Exercise Prescription - 09/10/16 1200      Date of Initial Exercise RX and Referring Provider   Date 09/10/16   Referring Provider Glori Bickers MD     Treadmill   MPH 2.4   Grade 1   Minutes 10   METs 3.08     Recumbant Bike   Level 2   Minutes 10   METs 2     NuStep   Level 3   SPM 70   Minutes 10   METs 2     Intensity   THRR 40-80% of Max Heartrate 71-142   Ratings of Perceived Exertion 11-13   Perceived Dyspnea 0-4     Progression   Progression Continue to progress workloads to maintain intensity without signs/symptoms of physical distress.     Resistance Training   Training Prescription Yes   Weight 1lb   Reps 10-15      Perform Capillary Blood Glucose checks as needed.  Exercise Prescription Changes:     Exercise Prescription Changes    Row Name 09/16/16 1600 09/30/16 1100 10/13/16  1500 11/09/16 1500 11/20/16 1517     Response to Exercise   Blood Pressure (Admit) 126/80 136/85 110/70 118/60 104/65   Blood Pressure (Exercise) 104/76 116/80 140/80 144/74 112/80   Blood Pressure (Exit) 112/62 103/65 114/70 112/68 102/60   Heart Rate (Admit) 91 bpm 92 bpm 99 bpm 99 bpm 93 bpm   Heart Rate (Exercise) 129 bpm 132 bpm 142 bpm 130 bpm 113 bpm   Heart Rate (Exit) 100 bpm 98 bpm 97 bpm 109 bpm 87 bpm   Rating of Perceived Exertion (Exercise) 13 13 15 12 14    Symptoms  -  -  - pt reported R sided gluteal/piriformis pain  -   Comments pt was oriented to exercise equipment and responded well to exercise pt was oriented to exercise equipment and responded well to exercise  - Guided pt through stretches to help with gluteal pain/discomfort  -   Duration Continue with 30 min of aerobic exercise without signs/symptoms of physical distress. Continue with 30 min of aerobic exercise without signs/symptoms of physical distress. Continue with 30 min of aerobic exercise without signs/symptoms of physical distress. Continue with 30 min of aerobic exercise without signs/symptoms of physical distress. Continue with 30 min of aerobic exercise without signs/symptoms of physical distress.  Intensity THRR unchanged THRR unchanged THRR unchanged THRR unchanged THRR unchanged     Progression   Progression Continue to progress workloads to maintain intensity without signs/symptoms of physical distress. Continue to progress workloads to maintain intensity without signs/symptoms of physical distress. Continue to progress workloads to maintain intensity without signs/symptoms of physical distress. Continue to progress workloads to maintain intensity without signs/symptoms of physical distress. Continue to progress workloads to maintain intensity without signs/symptoms of physical distress.   Average METs  - 3.3 2.2 2.5 3.2     Resistance Training   Training Prescription Yes Yes Yes Yes Yes   Weight 1lbs  3lbs 3lbs 5lbs 5lbs   Reps 10-15 10-15 10-15 10-15 10-15   Time 10 Minutes 10 Minutes 10 Minutes 10 Minutes 10 Minutes     Treadmill   MPH 2.4  -  - 2.4 2.5   Grade 1  -  - 1 3   Minutes 10  -  - 15 15   METs 3.08  -  - 3.08 3.95     Recumbant Bike   Level 2 2.5 3.5  - 3.5  upright scifit   Minutes 10 15 15   - 15   METs 2 4 1.7  - 2.5     NuStep   Level 3 4 4 4 5    SPM 80 90 90 80  decr. SPM due to gluteal pain 90   Minutes 10 15 15 15 15    METs 2 2.6 2.5 1.9 3.1     Home Exercise Plan   Plans to continue exercise at  - Home (comment)  walking Home (comment)  walking Home (comment)  walking Home (comment)  walking   Frequency  - Add 2 additional days to program exercise sessions. Add 2 additional days to program exercise sessions. Add 2 additional days to program exercise sessions. Add 2 additional days to program exercise sessions.   Initial Home Exercises Provided  - 09/25/16 09/25/16 09/25/16 09/25/16   Row Name 12/08/16 1500 12/18/16 1728 01/06/17 1700         Response to Exercise   Blood Pressure (Admit) 126/60 117/83 108/76     Blood Pressure (Exercise) 120/80 120/80 112/70     Blood Pressure (Exit) 108/64 124/78 102/60     Heart Rate (Admit) 94 bpm 82 bpm 99 bpm     Heart Rate (Exercise) 110 bpm 103 bpm 130 bpm     Heart Rate (Exit) 96 bpm 84 bpm 98 bpm     Rating of Perceived Exertion (Exercise) 11 15 14      Duration Continue with 30 min of aerobic exercise without signs/symptoms of physical distress. Continue with 30 min of aerobic exercise without signs/symptoms of physical distress. Continue with 30 min of aerobic exercise without signs/symptoms of physical distress.     Intensity THRR unchanged THRR unchanged THRR unchanged       Progression   Progression Continue to progress workloads to maintain intensity without signs/symptoms of physical distress. Continue to progress workloads to maintain intensity without signs/symptoms of physical distress. Continue  to progress workloads to maintain intensity without signs/symptoms of physical distress.     Average METs 3.4 2.9 3.2       Resistance Training   Training Prescription Yes Yes Yes     Weight 5lbs 5lbs 5lbs     Reps 10-15 10-15 10-15     Time 10 Minutes 10 Minutes 10 Minutes       Treadmill   MPH  2.3 2.5 2.5     Grade 3 3 3      Minutes 15 10 15      METs 3.95 3.95 3.95       NuStep   Level 5 5 5      SPM 90 85 90     Minutes 15 10 15      METs 2.9 2.7 2.4       Track   Laps  - 6  -     Minutes  - 10  -     METs  - 2.04  -       Home Exercise Plan   Plans to continue exercise at Home (comment)  walking Home (comment) Home (comment)  walking     Frequency Add 2 additional days to program exercise sessions. Add 2 additional days to program exercise sessions. Add 2 additional days to program exercise sessions.     Initial Home Exercises Provided 09/25/16 09/25/16 09/25/16        Exercise Comments:     Exercise Comments    Row Name 09/16/16 1612 10/13/16 1519 11/09/16 1520 12/08/16 1520 01/06/17 1731   Exercise Comments Pt completed first session of cardiac rehab today. Pt exercise for minutes without signs/symptoms of CP, dizziness and SOB Reviewed METs and goals. Pt is tolerating exercise very well; will continue to monitor exercise progression. Reviewed METs and goals. Pt is tolerating exercise very well; will continue to monitor exercise progression. Reviewed METs and goals. Pt is tolerating exercise very well; will continue to monitor exercise progression. Reviewed METs and goals. Pt is tolerating exercise very well; will continue to monitor exercise progression.      Exercise Goals and Review:     Exercise Goals    Row Name 09/10/16 1610 09/10/16 1156           Exercise Goals   Increase Physical Activity (P)  Yes Yes      Intervention (P)  Provide advice, education, support and counseling about physical activity/exercise needs.;Develop an individualized exercise  prescription for aerobic and resistive training based on initial evaluation findings, risk stratification, comorbidities and participant's personal goals. Provide advice, education, support and counseling about physical activity/exercise needs.;Develop an individualized exercise prescription for aerobic and resistive training based on initial evaluation findings, risk stratification, comorbidities and participant's personal goals.      Expected Outcomes (P)  Achievement of increased cardiorespiratory fitness and enhanced flexibility, muscular endurance and strength shown through measurements of functional capacity and personal statement of participant. Achievement of increased cardiorespiratory fitness and enhanced flexibility, muscular endurance and strength shown through measurements of functional capacity and personal statement of participant.      Increase Strength and Stamina (P)  Yes Yes      Intervention (P)  Develop an individualized exercise prescription for aerobic and resistive training based on initial evaluation findings, risk stratification, comorbidities and participant's personal goals.;Provide advice, education, support and counseling about physical activity/exercise needs. Provide advice, education, support and counseling about physical activity/exercise needs.;Develop an individualized exercise prescription for aerobic and resistive training based on initial evaluation findings, risk stratification, comorbidities and participant's personal goals.      Expected Outcomes (P)  Achievement of increased cardiorespiratory fitness and enhanced flexibility, muscular endurance and strength shown through measurements of functional capacity and personal statement of participant. Achievement of increased cardiorespiratory fitness and enhanced flexibility, muscular endurance and strength shown through measurements of functional capacity and personal statement of participant.         Exercise  Goals  Re-Evaluation :     Exercise Goals Re-Evaluation    Row Name 09/25/16 1602 10/13/16 1519 11/09/16 1519 12/08/16 1520 01/06/17 1731     Exercise Goal Re-Evaluation   Exercise Goals Review Increase Physical Activity;Increase Strenth and Stamina Increase Physical Activity;Increase Strenth and Stamina Increase Physical Activity;Increase Strenth and Stamina  -  -   Comments Reviewed home exercise with pt today.  Pt plans to walk for exercise. Encouraged 150 minutes of aerobic activities per week. Pt plans to walk 2 additional days per week outside of coming to cardiac rehab. Reviewed THR, pulse, RPE, sign and symptoms, and when to call 911 or MD.  Also discussed weather considerations and indoor options.  Pt voiced understanding. 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 pt stated she is maintaining but is limited by IBS. Encouraged pt to exercise/walk on "feel good" days and to do what she can. Discussed the importance of exercise and hydration and even consulting with MD if symptoms worsen. pt stated she is maintaining in activity levels.  Expressed the importance of walking to help with energy levels   Expected Outcomes Pt will be compliant with HEP and be able to exercise without difficulty. Pt will continue to improve in aerobic and functional fitness Pt will continue to improve in aerobic and functional fitness Pt will continue to improve in aerobic and functional fitness Pt will continue to improve in aerobic and functional fitness       Discharge Exercise Prescription (Final Exercise Prescription Changes):     Exercise Prescription Changes - 01/06/17 1700      Response to Exercise   Blood Pressure (Admit) 108/76   Blood Pressure (Exercise) 112/70   Blood Pressure (Exit) 102/60   Heart Rate (Admit) 99 bpm   Heart Rate (Exercise) 130 bpm   Heart Rate (Exit) 98 bpm    Rating of Perceived Exertion (Exercise) 14   Duration Continue with 30 min of aerobic exercise without signs/symptoms of physical distress.   Intensity THRR unchanged     Progression   Progression Continue to progress workloads to maintain intensity without signs/symptoms of physical distress.   Average METs 3.2     Resistance Training   Training Prescription Yes   Weight 5lbs   Reps 10-15   Time 10 Minutes     Treadmill   MPH 2.5   Grade 3   Minutes 15   METs 3.95     NuStep   Level 5   SPM 90   Minutes 15   METs 2.4     Home Exercise Plan   Plans to continue exercise at Home (comment)  walking   Frequency Add 2 additional days to program exercise sessions.   Initial Home Exercises Provided 09/25/16      Nutrition:  Target Goals: Understanding of nutrition guidelines, daily intake of sodium 1500mg , cholesterol 200mg , calories 30% from fat and 7% or less from saturated fats, daily to have 5 or more servings of fruits and vegetables.  Biometrics:     Pre Biometrics - 09/10/16 1139      Pre Biometrics   Waist Circumference 43 inches   Hip Circumference 51 inches   Waist to Hip Ratio 0.84 %   Triceps Skinfold 58 mm   % Body Fat 50 %   Grip Strength 36 kg  Flexibility 14 in   Single Leg Stand 17.75 seconds       Nutrition Therapy Plan and Nutrition Goals:   Nutrition Discharge: Nutrition Scores:   Nutrition Goals Re-Evaluation:   Nutrition Goals Re-Evaluation:   Nutrition Goals Discharge (Final Nutrition Goals Re-Evaluation):   Psychosocial: Target Goals: Acknowledge presence or absence of significant depression and/or stress, maximize coping skills, provide positive support system. Participant is able to verbalize types and ability to use techniques and skills needed for reducing stress and depression.  Initial Review & Psychosocial Screening:     Initial Psych Review & Screening - 09/10/16 1605      Initial Review   Current issues with  Current Stress Concerns;Current Anxiety/Panic   Source of Stress Concerns Chronic Illness;Unable to participate in former interests or hobbies;Family   Comments Pt has young daughter and wonders with her current health issues what the future will be.     Family Dynamics   Good Support System? Yes     Barriers   Psychosocial barriers to participate in program The patient should benefit from training in stress management and relaxation.     Screening Interventions   Interventions Encouraged to exercise      Quality of Life Scores:     Quality of Life - 09/23/16 1631      Quality of Life Scores   Health/Function Pre 5.77 %  pt QOL severely altered by her physical disablity and social isolation from recent cardiac events.  health related anxiety and pt symptoms make it difficult for her to engage in day to day activities. pt is encouraged to participate in CR to decrease sx.   Socioeconomic Pre 16.86 %   Psych/Spiritual Pre 9.57 %   Family Pre 26.1 %   GLOBAL Pre 11.82 %  pt admits to feelings of helplessness and hopelessness. pt denies suicidal tendancies and reports improvement in her perspective and outlook recently.  pt is currenlty under psychiatric care seeking new counselor. list of area counselors provided to her       PHQ-9: Recent Review Flowsheet Data    Depression screen [facility] 2/9 09/16/2016 02/16/2015   Decreased Interest 2 0   Down, Depressed, Hopeless 2 0   PHQ - 2 Score 4 0   Altered sleeping 2 -   Tired, decreased energy 2 -   Change in appetite 2 -   Feeling bad or failure about yourself  2 -   Trouble concentrating 2 -   Moving slowly or fidgety/restless 0 -   Suicidal thoughts 0 -   PHQ-9 Score 14 -   Difficult doing work/chores Very difficult -     Interpretation of Total Score  Total Score Depression Severity:  1-4 = Minimal depression, 5-9 = Mild depression, 10-14 = Moderate depression, 15-19 = Moderately severe depression, 20-27 = Severe depression    Psychosocial Evaluation and Intervention:     Psychosocial Evaluation - 09/16/16 1642      Psychosocial Evaluation & Interventions   Interventions Therapist referral;Physician referral;Stress management education;Relaxation education;Encouraged to exercise with the program and follow exercise prescription   Comments pt is currently being treated by psychiatrist however she is looking additional counseling. offered to schedule appt, however she would like to discuss this possiblity with her psychiatrist first.     Expected Outcomes pt will continue current depression/ anxiety treatment to develop effective coping skills and positive outlook.    Continue Psychosocial Services  Follow up required by staff  Psychosocial Re-Evaluation:     Psychosocial Re-Evaluation    [place] Name 10/09/16 1104 11/10/16 1630 12/02/16 1123 01/07/17 1128       Psychosocial Re-Evaluation   Current issues with Current Depression;Current Anxiety/Panic;Current Stress Concerns Current Depression;Current Anxiety/Panic;Current Stress Concerns Current Depression;Current Anxiety/Panic;Current Stress Concerns Current Depression;Current Anxiety/Panic;Current Stress Concerns    Comments pt with known depression history and health related anxiety exhibits good coping skills and decreased depression symptoms.   pt with known depression history and health related anxiety exhibits good coping skills and decreased depression symptoms.   pt with known depression history and health related anxiety exhibits good coping skills and decreased depression symptoms.  pt admits to feeling more confident with her funcational abilitites. she proudly claims walking her dogs daily, taking longer walks on non CR days and parking futher away to get more steps.  she also reports stairs are easier to use.   pt with known depression history and health related anxiety exhibits good coping skills and decreased depression symptoms.  pt admits to  feeling more confident with her funcational abilitites. she proudly claims walking her dogs daily, taking longer walks on non CR days and parking futher away to get more steps.  she also reports stairs are easier to use.      Expected Outcomes pt will exhibit positive outlook with good coping skills.  pt will exhibit positive outlook with good coping skills.  pt will exhibit positive outlook with good coping skills.  pt will exhibit positive outlook with good coping skills.     Interventions Stress management education;Relaxation education;Encouraged to attend Cardiac Rehabilitation for the exercise;Therapist referral Stress management education;Relaxation education;Encouraged to attend Cardiac Rehabilitation for the exercise;Therapist referral Stress management education;Relaxation education;Encouraged to attend Cardiac Rehabilitation for the exercise;Therapist referral Stress management education;Relaxation education;Encouraged to attend Cardiac Rehabilitation for the exercise;Therapist referral    Continue Psychosocial Services  Follow up required by staff Follow up required by staff Follow up required by staff Follow up required by staff    Comments  -  -  - Pt has young daughter and wonders with her current health issues what the future will be.      Initial Review   Source of Stress Concerns  -  -  - Chronic Illness;Unable to participate in former interests or hobbies;Family       Psychosocial Discharge (Final Psychosocial Re-Evaluation):     Psychosocial Re-Evaluation - 01/07/17 1128      Psychosocial Re-Evaluation   Current issues with Current Depression;Current Anxiety/Panic;Current Stress Concerns   Comments pt with known depression history and health related anxiety exhibits good coping skills and decreased depression symptoms.  pt admits to feeling more confident with her funcational abilitites. she proudly claims walking her dogs daily, taking longer walks on non CR days and parking  futher away to get more steps.  she also reports stairs are easier to use.     Expected Outcomes pt will exhibit positive outlook with good coping skills.    Interventions Stress management education;Relaxation education;Encouraged to attend Cardiac Rehabilitation for the exercise;Therapist referral   Continue Psychosocial Services  Follow up required by staff   Comments Pt has young daughter and wonders with her current health issues what the future will be.     Initial Review   Source of Stress Concerns Chronic Illness;Unable to participate in former interests or hobbies;Family      Vocational Rehabilitation: Provide vocational rehab assistance to qualifying candidates.   Vocational Rehab Evaluation & Intervention:  Vocational Rehab - 09/10/16 1606      Initial Vocational Rehab Evaluation & Intervention   Assessment shows need for Vocational Rehabilitation No      Education: Education Goals: Education classes will be provided on a weekly basis, covering required topics. Participant will state understanding/return demonstration of topics presented.  Learning Barriers/Preferences:     Learning Barriers/Preferences - 09/10/16 7902      Learning Barriers/Preferences   Learning Barriers Sight   Learning Preferences Written Material      Education Topics: Count Your Pulse:  -Group instruction provided by verbal instruction, demonstration, patient participation and written materials to support subject.  Instructors address importance of being able to find your pulse and how to count your pulse when at home without a heart monitor.  Patients get hands on experience counting their pulse with staff help and individually.   CARDIAC REHAB PHASE II EXERCISE from 12/18/2016 in [place]  Date  09/25/16  Instruction Review Code  2- meets goals/outcomes      Heart Attack, Angina, and Risk Factor Modification:  -Group instruction provided by verbal  instruction, video, and written materials to support subject.  Instructors address signs and symptoms of angina and heart attacks.    Also discuss risk factors for heart disease and how to make changes to improve heart health risk factors.   Functional Fitness:  -Group instruction provided by verbal instruction, demonstration, patient participation, and written materials to support subject.  Instructors address safety measures for doing things around the house.  Discuss how to get up and down off the floor, how to pick things up properly, how to safely get out of a chair without assistance, and balance training.   Meditation and Mindfulness:  -Group instruction provided by verbal instruction, patient participation, and written materials to support subject.  Instructor addresses importance of mindfulness and meditation practice to help reduce stress and improve awareness.  Instructor also leads participants through a meditation exercise.    Stretching for Flexibility and Mobility:  -Group instruction provided by verbal instruction, patient participation, and written materials to support subject.  Instructors lead participants through series of stretches that are designed to increase flexibility thus improving mobility.  These stretches are additional exercise for major muscle groups that are typically performed during regular warm up and cool down.   CARDIAC REHAB PHASE II EXERCISE from 12/18/2016 in [place]  Date  12/18/16  Instruction Review Code  2- meets goals/outcomes      Hands Only CPR:  -Group verbal, video, and participation provides a basic overview of AHA guidelines for community CPR. Role-play of emergencies allow participants the opportunity to practice calling for help and chest compression technique with discussion of AED use.   Hypertension: -Group verbal and written instruction that provides a basic overview of hypertension including the most  recent diagnostic guidelines, risk factor reduction with self-care instructions and medication management.    Nutrition I class: Heart Healthy Eating:  -Group instruction provided by PowerPoint slides, verbal discussion, and written materials to support subject matter. The instructor gives an explanation and review of the Therapeutic Lifestyle Changes diet recommendations, which includes a discussion on lipid goals, dietary fat, sodium, fiber, plant stanol/sterol esters, sugar, and the components of a well-balanced, healthy diet.   Nutrition II class: Lifestyle Skills:  -Group instruction provided by PowerPoint slides, verbal discussion, and written materials to support subject matter. The instructor gives an explanation and review of label reading,  grocery shopping for heart health, heart healthy recipe modifications, and ways to make healthier choices when eating out.   Diabetes Question & Answer:  -Group instruction provided by PowerPoint slides, verbal discussion, and written materials to support subject matter. The instructor gives an explanation and review of diabetes co-morbidities, pre- and post-prandial blood glucose goals, pre-exercise blood glucose goals, signs, symptoms, and treatment of hypoglycemia and hyperglycemia, and foot care basics.   CARDIAC REHAB PHASE II EXERCISE from 12/18/2016 in [place]  Date  09/25/16  Educator  RD  Instruction Review Code  2- meets goals/outcomes      Diabetes Blitz:  -Group instruction provided by PowerPoint slides, verbal discussion, and written materials to support subject matter. The instructor gives an explanation and review of the physiology behind type 1 and type 2 diabetes, diabetes medications and rational behind using different medications, pre- and post-prandial blood glucose recommendations and Hemoglobin A1c goals, diabetes diet, and exercise including blood glucose guidelines for exercising safely.     Portion Distortion:  -Group instruction provided by PowerPoint slides, verbal discussion, written materials, and food models to support subject matter. The instructor gives an explanation of serving size versus portion size, changes in portions sizes over the last 20 years, and what consists of a serving from each food group.   CARDIAC REHAB PHASE II EXERCISE from 12/18/2016 in [place]  Date  09/17/16  Educator  RD  Instruction Review Code  2- meets goals/outcomes      Stress Management:  -Group instruction provided by verbal instruction, video, and written materials to support subject matter.  Instructors review role of stress in heart disease and how to cope with stress positively.     CARDIAC REHAB PHASE II EXERCISE from 12/18/2016 in [place]  Date  09/23/16  Instruction Review Code  2- meets goals/outcomes      Exercising on Your Own:  -Group instruction provided by verbal instruction, power point, and written materials to support subject.  Instructors discuss benefits of exercise, components of exercise, frequency and intensity of exercise, and end points for exercise.  Also discuss use of nitroglycerin and activating EMS.  Review options of places to exercise outside of rehab.  Review guidelines for sex with heart disease.   Cardiac Drugs I:  -Group instruction provided by verbal instruction and written materials to support subject.  Instructor reviews cardiac drug classes: antiplatelets, anticoagulants, beta blockers, and statins.  Instructor discusses reasons, side effects, and lifestyle considerations for each drug class.   Cardiac Drugs II:  -Group instruction provided by verbal instruction and written materials to support subject.  Instructor reviews cardiac drug classes: angiotensin converting enzyme inhibitors (ACE-I), angiotensin II receptor blockers (ARBs), nitrates, and calcium channel blockers.   Instructor discusses reasons, side effects, and lifestyle considerations for each drug class.   Anatomy and Physiology of the Circulatory System:  Group verbal and written instruction and models provide basic cardiac anatomy and physiology, with the coronary electrical and arterial systems. Review of: AMI, Angina, Valve disease, Heart Failure, Peripheral Artery Disease, Cardiac Arrhythmia, Pacemakers, and the ICD.   Other Education:  -Group or individual verbal, written, or video instructions that support the educational goals of the cardiac rehab program.   Knowledge Questionnaire Score:     Knowledge Questionnaire Score - 09/10/16 1133      Knowledge Questionnaire Score   Pre Score 23/24      Core Components/Risk Factors/Patient Goals  at Admission:     Personal Goals and Risk Factors at Admission - 09/10/16 1139      Core Components/Risk Factors/Patient Goals on Admission    Weight Management Yes;Obesity;Weight Maintenance;Weight Loss   Intervention Weight Management/Obesity: Establish reasonable short term and long term weight goals.;Weight Management: Provide education and appropriate resources to help participant work on and attain dietary goals.;Weight Management: Develop a combined nutrition and exercise program designed to reach desired caloric intake, while maintaining appropriate intake of nutrient and fiber, sodium and fats, and appropriate energy expenditure required for the weight goal.;Obesity: Provide education and appropriate resources to help participant work on and attain dietary goals.   Expected Outcomes Short Term: Continue to assess and modify interventions until short term weight is achieved;Long Term: Adherence to nutrition and physical activity/exercise program aimed toward attainment of established weight goal;Weight Maintenance: Understanding of the daily nutrition guidelines, which includes 25-35% calories from fat, 7% or less cal from saturated fats, less  than 200mg  cholesterol, less than 1.5gm of sodium, & 5 or more servings of fruits and vegetables daily;Weight Loss: Understanding of general recommendations for a balanced deficit meal plan, which promotes 1-2 lb weight loss per week and includes a negative energy balance of [phone] kcal/d;Understanding recommendations for meals to include 15-35% energy as protein, 25-35% energy from fat, 35-60% energy from carbohydrates, less than 200mg  of dietary cholesterol, 20-35 gm of total fiber daily;Understanding of distribution of calorie intake throughout the day with the consumption of 4-5 meals/snacks   Diabetes Yes   Intervention Provide education about signs/symptoms and action to take for hypo/hyperglycemia.;Provide education about proper nutrition, including hydration, and aerobic/resistive exercise prescription along with prescribed medications to achieve blood glucose in normal ranges: Fasting glucose 65-99 mg/dL   Expected Outcomes Short Term: Participant verbalizes understanding of the signs/symptoms and immediate care of hyper/hypoglycemia, proper foot care and importance of medication, aerobic/resistive exercise and nutrition plan for blood glucose control.;Long Term: Attainment of HbA1C < 7%.   Heart Failure Yes   Intervention Provide a combined exercise and nutrition program that is supplemented with education, support and counseling about heart failure. Directed toward relieving symptoms such as shortness of breath, decreased exercise tolerance, and extremity edema.   Expected Outcomes Improve functional capacity of life;Short term: Attendance in program 2-3 days a week with increased exercise capacity. Reported lower sodium intake. Reported increased fruit and vegetable intake. Reports medication compliance.;Short term: Daily weights obtained and reported for increase. Utilizing diuretic protocols set by physician.;Long term: Adoption of self-care skills and reduction of barriers for early signs and  symptoms recognition and intervention leading to self-care maintenance.   Hypertension Yes   Intervention Provide education on lifestyle modifcations including regular physical activity/exercise, weight management, moderate sodium restriction and increased consumption of fresh fruit, vegetables, and low fat dairy, alcohol moderation, and smoking cessation.;Monitor prescription use compliance.   Expected Outcomes Short Term: Continued assessment and intervention until BP is < 140/35mm HG in hypertensive participants. < 130/1mm HG in hypertensive participants with diabetes, heart failure or chronic kidney disease.;Long Term: Maintenance of blood pressure at goal levels.   Stress Yes   Intervention Offer individual and/or small group education and counseling on adjustment to heart disease, stress management and health-related lifestyle change. Teach and support self-help strategies.;Refer participants experiencing significant psychosocial distress to appropriate mental health specialists for further evaluation and treatment. When possible, include family members and significant others in education/counseling sessions.   Expected Outcomes Short Term: Participant demonstrates changes in health-related behavior, relaxation and  other stress management skills, ability to obtain effective social support, and compliance with psychotropic medications if prescribed.;Long Term: Emotional wellbeing is indicated by absence of clinically significant psychosocial distress or social isolation.      Core Components/Risk Factors/Patient Goals Review:      Goals and Risk Factor Review    Row Name 10/09/16 1101 11/10/16 1629 12/02/16 1123 01/07/17 1127       Core Components/Risk Factors/Patient Goals Review   Personal Goals Review Weight Management/Obesity;Diabetes;Heart Failure;Hypertension;Stress Weight Management/Obesity;Diabetes;Heart Failure;Hypertension;Stress Weight Management/Obesity;Diabetes;Heart  Failure;Hypertension;Stress Weight Management/Obesity;Diabetes;Heart Failure;Hypertension;Stress    Review pt with multiple risk factors demonstrates eagerness to participate in CR activities.  pt is encouraged by her increased strength/stamina which allowed to participate in social outings. pt congratulated on this success.  pt demonstrates good diabetes, heart failure and hypertension self care practices.   pt with multiple risk factors demonstrates eagerness to participate in CR activities.  pt demonstrates good diabetes, heart failure and hypertension self care practices.   pt with multiple risk factors demonstrates eagerness to participate in CR activities.  pt demonstrates good diabetes, heart failure and hypertension self care practices.     pt demonstrates good diabetes, heart failure and hypertension self care practices.  pt CR participation sporadic at times.     Expected Outcomes t will participate in CR exercise, nutrition and lifestyle education opportunities to decrease overall CAD risk factors.  t will participate in CR exercise, nutrition and lifestyle education opportunities to decrease overall CAD risk factors.  t will participate in CR exercise, nutrition and lifestyle education opportunities to decrease overall CAD risk factors.  pt  will participate in CR exercise, nutrition and lifestyle education opportunities to decrease overall CAD risk factors.        Core Components/Risk Factors/Patient Goals at Discharge (Final Review):      Goals and Risk Factor Review - 01/07/17 1127      Core Components/Risk Factors/Patient Goals Review   Personal Goals Review Weight Management/Obesity;Diabetes;Heart Failure;Hypertension;Stress   Review   pt demonstrates good diabetes, heart failure and hypertension self care practices.  pt CR participation sporadic at times.    Expected Outcomes pt  will participate in CR exercise, nutrition and lifestyle education opportunities to decrease overall CAD  risk factors.       ITP Comments:     ITP Comments    Row Name 09/10/16 1053 11/10/16 1629 12/08/16 1707 01/07/17 1127     ITP Comments Medical Director, Dr. Fransico Him  Medical Director, Dr. Fransico Him  Medical Director, Dr. Fransico Him  Medical Director, Dr. Fransico Him        Comments: Pt is making expected progress toward personal goals after completing 20sessions. Recommend continued exercise and life style modification education including  stress management and relaxation techniques to decrease cardiac risk profile.

## 2017-01-07 NOTE — Progress Notes (Signed)
  Echocardiogram 2D Echocardiogram has been performed.  Tresa Res 01/07/2017, 2:53 PM

## 2017-01-07 NOTE — Progress Notes (Signed)
[facility] Note  Date:  01/07/2017   ID:  [name], DOB Oct 13, 1975, MRN [id number]  PCP:  Golden Circle, FNP  Cardiologist: Pernell Dupre, MD   History of Present Illness:  [name] is a 41 y.o. 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   Cath 6/17 revealed normal vessels, moderate elevation in pulmonary artery pressures, and LVEF 25-30%.  RHC 6/17 RA 5 RV 35/6 PA 41/12 PW 11 Fick 6.4/3.0 PA sat 62%  Echo 6/15 35-40% Echo 6/17 25-30% Echo 12/17 20-25%  CPX 2/18  FVC 2.57 (79%)    FEV1 2.14 (81%)     FEV1/FVC 83 (101%)     MVV 107 (102%) Resting HR: 106 Peak HR: 166  (93% age predicted max HR)  BP rest: 136/98 BP peak: 174/88 Peak VO2: 17.1 (85% predicted peak VO2) - corrects to 28.4 for ibw VE/VCO2 slope: 33 OUES: 2.16  Peak RER: 1.09 Ventilatory Threshold: 14.6 (73% predicted or measured peak VO2) VE/MVV: 59% PETCO2 at peak: 33 O2pulse: 10  (83% predicted O2pulse)  48 holter in 2/18 2% PVCS  She returns today for HF follow up, overall feeling ok: still goes to CR Maintenance program. Able to do stepper and TM for 15 mins at at time. TM at 2.5 mph at 3% incline. Gets tired quickly but not as SOB. Does get SOB with steps or when she goes out. Has limited her activities. Has had some edema. Dr. Tamala Julian recently switched to torsemide with good effect. No orthopnea or PND. BP at CR has been in 120s.   Echo today: EF ~25-30% (reviewed personally)   Past Medical History:  Diagnosis Date  . AICD (automatic cardioverter/defibrillator) present   . Anxiety   . Arthritis    right shoulder   . Asthma   . CHF (congestive heart failure) ([place])   . Depression   . Diabetes mellitus without complication ([place])   . Dyspnea    comes and goes  intermittently mostly with exertion   . Fibroid    age 60  . Gallstones   . Hypertension   . IBS (irritable bowel syndrome)   . Migraine    "monthly" (08/03/2016)  . OSA on CPAP   . Ovarian cyst    1999; surgically removed  . 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 Seizures ([place])    "as a child" (08/03/2016)  . Termination of pregnancy    due to cardiac risk    Past Surgical History:  Procedure Laterality Date  . CARDIAC CATHETERIZATION N/A 11/11/2015   Procedure: Right/Left Heart Cath and Coronary Angiography;  Surgeon: Troy Sine, MD;  Location: [facility];  Service: Cardiovascular;  Laterality: N/A;  . CARDIAC CATHETERIZATION  ~ 2015  . CARDIAC DEFIBRILLATOR PLACEMENT  08/03/2016  . CESAREAN SECTION  1999  . COLONOSCOPY WITH PROPOFOL N/A 04/21/2016   Procedure: COLONOSCOPY WITH PROPOFOL;  Surgeon: Jerene Bears, MD;  [facility];  Service: Gastroenterology;  Laterality: N/A;  . ESOPHAGOGASTRODUODENOSCOPY (EGD) WITH PROPOFOL N/A 04/21/2016   Procedure: ESOPHAGOGASTRODUODENOSCOPY (EGD) WITH PROPOFOL;  Surgeon: Jerene Bears, MD;  [facility]: [facility];  Service: Gastroenterology;  Laterality: N/A;  . FOOT FRACTURE SURGERY Right ~ 2003  . FRACTURE SURGERY    . ICD IMPLANT N/A 08/03/2016  Procedure: ICD Implant;  Surgeon: Deboraha Sprang, MD;  Location: [facility];  Service: Cardiovascular;  Laterality: N/A;  . LAPAROSCOPIC CHOLECYSTECTOMY  12/2006  . LAPAROSCOPY ABDOMEN DIAGNOSTIC  2008   "cut bile duct w/gallbladder OR; had to go in later & fix leak; hospitalized for 2 months"  . LEFT HEART CATHETERIZATION WITH CORONARY ANGIOGRAM N/A 02/26/2014   Procedure: LEFT HEART CATHETERIZATION WITH CORONARY ANGIOGRAM;  Surgeon: Jettie Booze, MD;  Location: [facility] C[facility];  Service: Cardiovascular;  Laterality: N/A;  . OVARIAN CYST REMOVAL Right 1999  . TUBAL LIGATION  1999    Current Medications:  Current Meds  Medication Sig  .  albuterol (PROVENTIL HFA;VENTOLIN HFA) 108 (90 Base) MCG/ACT inhaler Inhale 2 puffs into the lungs every 6 (six) hours as needed for wheezing or shortness of breath.  . Armodafinil (NUVIGIL) 250 MG tablet Take 250 mg by mouth daily.  . B-D ULTRAFINE III SHORT PEN 31G X 8 MM MISC USE TO TEST BLOOD SUGARS 1-2 TIMES A DAY AS NEEDED.  Marland Kitchen BIOTIN PO Take 500 mg by mouth daily.  . budesonide-formoterol (SYMBICORT) 160-4.5 MCG/ACT inhaler Inhale 2 puffs into the lungs 2 (two) times daily.  . carvedilol (COREG) 25 MG tablet Take 1 tablet (25 mg total) by mouth 2 (two) times daily with a meal.  . colestipol (COLESTID) 1 g tablet Take 2 tablets (2 g total) by mouth daily.  . cyclobenzaprine (FLEXERIL) 10 MG tablet Take 1 tablet (10 mg total) by mouth 3 (three) times daily as needed for muscle spasms.  . Diclofenac Sodium 2 % SOLN Apply 1 pump onto the skin twice daily as needed for pain.  Marland Kitchen dicyclomine (BENTYL) 20 MG tablet Take 20 mg by mouth 2 (two) times daily as needed for spasms.  . fluticasone (FLONASE) 50 MCG/ACT nasal spray PLACE 2 SPRAYS INTO BOTH NOSTRILS DAILY AS NEEDED FOR ALLERGIES.  Marland Kitchen gabapentin (NEURONTIN) 100 MG capsule Take 100 mg by mouth daily as needed (for pain).   Marland Kitchen gentamicin ointment (GARAMYCIN) 0.1 % Apply 1 application topically 3 (three) times daily.  . Insulin Pen Needle (NOVOFINE) 32G X 6 MM MISC Use to administer victoza every day  . ivabradine (CORLANOR) 5 MG TABS tablet Take 1.5 tablets (7.5 mg total) by mouth 2 (two) times daily with a meal.  . magnesium oxide (MAG-OX) 400 MG tablet Take 1 tablet (400 mg total) by mouth daily.  . ondansetron (ZOFRAN ODT) 4 MG disintegrating tablet Take 1 tablet (4 mg total) by mouth every 8 (eight) hours as needed for nausea or vomiting.  . potassium chloride (K-DUR) 10 MEQ tablet Take 10 mEq by mouth daily.  . sacubitril-valsartan (ENTRESTO) 97-103 MG Take 1 tablet by mouth 2 (two) times daily.  Marland Kitchen spironolactone (ALDACTONE) 25 MG tablet TAKE  1 TABLET BY MOUTH EVERY DAY  . torsemide (DEMADEX) 20 MG tablet Take 2 tablets (40 mg total) by mouth 2 (two) times daily.  Marland Kitchen trimethoprim-polymyxin b (POLYTRIM) ophthalmic solution Place 1 drop into the left eye every 4 (four) hours.  . Turmeric 500 MG CAPS Take 500 mg by mouth daily.  Marland Kitchen UNABLE TO FIND CPAP  . venlafaxine XR (EFFEXOR-XR) 75 MG 24 hr capsule Take 225 mg by mouth daily with breakfast.  . VICTOZA 18 MG/3ML SOPN Inject 0.3 mLs (1.8 mg total) into the skin daily.  . vitamin B-12 (CYANOCOBALAMIN) 500 MCG tablet Take 500 mcg by mouth daily.  . Vitamin D, Ergocalciferol, (DRISDOL) 50000 units CAPS capsule  Take 1 capsule (50,000 Units total) by mouth every 7 (seven) days.    Allergies:   Vancomycin; Contrast media [iodinated diagnostic agents]; Aspirin; Ciprofloxacin; and Sulfa antibiotics   Social History   Social History  . Marital status: Married    Spouse name: N/A  . Number of children: 1  . Years of education: N/A   Occupational History  . stay at home mom    Social History Main Topics  . Smoking status: Never Smoker  . Smokeless tobacco: Never Used  . Alcohol use No  . Drug use: No  . Sexual activity: Yes   Other Topics Concern  . None   Social History Narrative  . None     Family History:  The patient's family history includes Allergies in her daughter; Emphysema in her maternal grandmother; Heart disease (age of onset: 65) in her maternal grandmother; Rheum arthritis in her mother.    PHYSICAL EXAM:    VS:  BP (!) 144/92   Pulse 88   Wt 232 lb (105.2 kg)   SpO2 98%   BMI 38.61 kg/m    General:  Well appearing. No resp difficulty HEENT: normal Neck: supple. no JVD. Carotids 2+ bilat; no bruits. No lymphadenopathy or thryomegaly appreciated. Cor: PMI nondisplaced. Regular rate & rhythm. No rubs, gallops or murmurs. Lungs: clear Abdomen: soft, nontender, nondistended. No hepatosplenomegaly. No bruits or masses. Good bowel sounds. Extremities: no  cyanosis, clubbing, rash, edema Neuro: alert & orientedx3, cranial nerves grossly intact. moves all 4 extremities w/o difficulty. Affect pleasant   Wt Readings from Last 3 Encounters:  01/07/17 232 lb (105.2 kg)  12/24/16 236 lb (107 kg)  12/15/16 236 lb (107 kg)      Studies/Labs Reviewed:    Recent Labs: 05/28/2016: ALT 8; TSH 1.09 06/24/2016: B Natriuretic Peptide 36.7 07/30/2016: Hemoglobin 13.4; Platelets 231 10/07/2016: BUN 7; Creatinine, Ser 0.88; Potassium 4.1; Sodium 137   Lipid Panel    Component Value Date/Time   CHOL 160 11/09/2015 0526   TRIG 70 11/09/2015 0526   HDL 58 11/09/2015 0526   CHOLHDL 2.8 11/09/2015 0526   VLDL 14 11/09/2015 0526   LDLCALC 88 11/09/2015 0526    Additional studies/ records that were reviewed today include:  No recent laboratory data.    ASSESSMENT:    1. Chronic systolic HF:  due to peri-partum CM, onset 1999 - EF 20-25% (12/17). Echo today EF 25-30%  - Continues to experience NYHA III symptoms but performance at CR and CPX seems better than that - Volume status improved on torsemide 40 bid - BMET today - Continue Entresto 97/103 mg BID - Continue Coreg 25mg  BID - Continue Spiro 25mg  daily.  - On a great medication regimen.  - Continue corlanor 7.5mg  BID. HR is 69.  - ICD placed in March 2018.  - Will need repeat Echo in 2-3 months  - Continue remote ICM monitoring  - Repeat CPX testing in 4 months 2. Morbid obesity - Continue diet and exercise 3. OSA -Compliant with CPAP.  4. Anxiety/depression - Follows with PCP.  5. DM2 - A1c in Jan. 2018 is 5.3. Per PCP. Consider Jardiance.    Glori Bickers, MD  01/07/2017 3:38 PM

## 2017-01-07 NOTE — Patient Instructions (Signed)
Routine lab work today. Will notify you of abnormal results  Follow up and CPX in 4 months

## 2017-01-08 ENCOUNTER — Encounter (HOSPITAL_COMMUNITY): Payer: BLUE CROSS/BLUE SHIELD

## 2017-01-08 MED ORDER — VICTOZA 18 MG/3ML ~~LOC~~ SOPN: 1.8000 mg | PEN_INJECTOR | Freq: Every day | SUBCUTANEOUS | 0 refills | Status: DC

## 2017-01-08 MED ORDER — CYCLOBENZAPRINE HCL 10 MG PO TABS: 10.0000 mg | ORAL_TABLET | Freq: Three times a day (TID) | ORAL | 0 refills | Status: DC | PRN

## 2017-01-17 ENCOUNTER — Encounter (HOSPITAL_COMMUNITY): Payer: Self-pay

## 2017-01-17 ENCOUNTER — Telehealth: Payer: Self-pay | Admitting: Physician Assistant

## 2017-01-17 ENCOUNTER — Other Ambulatory Visit: Payer: Self-pay | Admitting: Family Medicine

## 2017-01-17 ENCOUNTER — Emergency Department (HOSPITAL_COMMUNITY): Payer: BLUE CROSS/BLUE SHIELD

## 2017-01-17 ENCOUNTER — Emergency Department (HOSPITAL_COMMUNITY)
Admission: EM | Admit: 2017-01-17 | Discharge: 2017-01-17 | Disposition: A | Discharge status: Home / Self Care | Payer: BLUE CROSS/BLUE SHIELD | Attending: Emergency Medicine | Admitting: Emergency Medicine

## 2017-01-17 DIAGNOSIS — R0602 Shortness of breath: Secondary | ICD-10-CM | POA: Diagnosis present

## 2017-01-17 DIAGNOSIS — Z79899 Other long term (current) drug therapy: Secondary | ICD-10-CM | POA: Diagnosis not present

## 2017-01-17 DIAGNOSIS — I110 Hypertensive heart disease with heart failure: Secondary | ICD-10-CM | POA: Diagnosis not present

## 2017-01-17 DIAGNOSIS — E119 Type 2 diabetes mellitus without complications: Secondary | ICD-10-CM | POA: Insufficient documentation

## 2017-01-17 DIAGNOSIS — I509 Heart failure, unspecified: Secondary | ICD-10-CM | POA: Insufficient documentation

## 2017-01-17 DIAGNOSIS — J4521 Mild intermittent asthma with (acute) exacerbation: Secondary | ICD-10-CM

## 2017-01-17 DIAGNOSIS — E876 Hypokalemia: Secondary | ICD-10-CM | POA: Diagnosis not present

## 2017-01-17 DIAGNOSIS — Z95810 Presence of automatic (implantable) cardiac defibrillator: Secondary | ICD-10-CM | POA: Insufficient documentation

## 2017-01-17 DIAGNOSIS — J4531 Mild persistent asthma with (acute) exacerbation: Secondary | ICD-10-CM | POA: Insufficient documentation

## 2017-01-17 DIAGNOSIS — Z794 Long term (current) use of insulin: Secondary | ICD-10-CM | POA: Insufficient documentation

## 2017-01-17 LAB — CBC
HCT: 36.8 % (ref 36.0–46.0)
Hemoglobin: 11.8 g/dL — ABNORMAL LOW (ref 12.0–15.0)
MCH: 28.1 pg (ref 26.0–34.0)
MCHC: 32.1 g/dL (ref 30.0–36.0)
MCV: 87.6 fL (ref 78.0–100.0)
Platelets: 223 10*3/uL (ref 150–400)
RBC: 4.2 MIL/uL (ref 3.87–5.11)
RDW: 13.9 % (ref 11.5–15.5)
WBC: 6 10*3/uL (ref 4.0–10.5)

## 2017-01-17 LAB — BASIC METABOLIC PANEL
Anion gap: 7 (ref 5–15)
BUN: <5 mg/dL — ABNORMAL LOW (ref 6–20)
CO2: 25 mmol/L (ref 22–32)
Calcium: 9.1 mg/dL (ref 8.9–10.3)
Chloride: 103 mmol/L (ref 101–111)
Creatinine, Ser: 0.76 mg/dL (ref 0.44–1.00)
GFR calc Af Amer: >60 mL/min (ref >60)
GFR calc non Af Amer: >60 mL/min (ref >60)
Glucose, Bld: 102 mg/dL — ABNORMAL HIGH (ref 65–99)
Potassium: 3.2 mmol/L — ABNORMAL LOW (ref 3.5–5.1)
Sodium: 135 mmol/L (ref 135–145)

## 2017-01-17 LAB — BRAIN NATRIURETIC PEPTIDE: B Natriuretic Peptide: 298 pg/mL — ABNORMAL HIGH (ref 0.0–100.0)

## 2017-01-17 LAB — I-STAT TROPONIN, ED
Troponin i, poc: 0 ng/mL (ref 0.00–0.08)
Troponin i, poc: 0 ng/mL (ref 0.00–0.08)

## 2017-01-17 MED ORDER — POTASSIUM CHLORIDE CRYS ER 20 MEQ PO TBCR: 20.0000 meq | EXTENDED_RELEASE_TABLET | Freq: Every day | ORAL | 0 refills | Status: DC

## 2017-01-17 MED ORDER — FUROSEMIDE 10 MG/ML IJ SOLN
60.0000 mg | Freq: Once | INTRAMUSCULAR | Status: AC
  Administered 2017-01-17: 60 mg via INTRAVENOUS
  Filled 2017-01-17: qty 6

## 2017-01-17 MED ORDER — POTASSIUM CHLORIDE CRYS ER 20 MEQ PO TBCR
40.0000 meq | EXTENDED_RELEASE_TABLET | Freq: Once | ORAL | Status: AC
  Administered 2017-01-17: 40 meq via ORAL
  Filled 2017-01-17: qty 2

## 2017-01-17 MED ORDER — ALBUTEROL SULFATE (2.5 MG/3ML) 0.083% IN NEBU
5.0000 mg | INHALATION_SOLUTION | Freq: Once | RESPIRATORY_TRACT | Status: AC
  Administered 2017-01-17: 5 mg via RESPIRATORY_TRACT
  Filled 2017-01-17: qty 6

## 2017-01-17 MED ORDER — METHYLPREDNISOLONE SODIUM SUCC 125 MG IJ SOLR
125.0000 mg | Freq: Once | INTRAMUSCULAR | Status: AC
  Administered 2017-01-17: 125 mg via INTRAVENOUS
  Filled 2017-01-17: qty 2

## 2017-01-17 MED ORDER — PREDNISONE 20 MG PO TABS: ORAL_TABLET | ORAL | 0 refills | Status: DC

## 2017-01-17 NOTE — ED Provider Notes (Signed)
[facility] DEPT Provider Note   CSN: [id number] Arrival date & time: 01/17/17  1355     History   Chief Complaint Chief Complaint  Patient presents with  . Shortness of Breath    HPI [name] is a 41 y.o. female.  HPI Patient presents with worsening shortness of breath for the last day. States she's had increased dyspnea with exertion and orthopnea. Also has had cough which is nonproductive. States her chest feels tight. No fever or chills. No recent medication changes. Patient had echocardiogram earlier this month which showed EF of 25%. She also states she's had a 10 pound weight gain over the last few days. Past Medical History:  Diagnosis Date  . AICD (automatic cardioverter/defibrillator) present   . Anxiety   . Arthritis    right shoulder   . Asthma   . CHF (congestive heart failure) ([place])   . Depression   . Diabetes mellitus without complication ([place])   . Dyspnea    comes and goes intermittently mostly with exertion   . Fibroid    age 19  . Gallstones   . Hypertension   . IBS (irritable bowel syndrome)   . Migraine    "monthly" (08/03/2016)  . OSA on CPAP   . Ovarian cyst    1999; surgically removed  . 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 Seizures ([place])    "as a child" (08/03/2016)  . Termination of pregnancy    due to cardiac risk    Patient Active Problem List   Diagnosis Date Noted  . 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 SI (sacroiliac) joint dysfunction 11/16/2016  . Nonallopathic lesion of lumbosacral region 11/16/2016  . Nonallopathic lesion of sacral region 11/16/2016  . Patellofemoral syndrome of both knees 10/16/2016  . Right foot injury, initial encounter 09/02/2016  . NICM (nonischemic cardiomyopathy) ([place]) 08/03/2016  . Premature ventricular contractions 06/23/2016  . Hyperglycemia 05/28/2016  . Generalized abdominal cramping   . Diabetes mellitus without  complication ([place])   . Labral tear of shoulder 04/04/2015  . Acute upper respiratory infection 11/06/2014  . 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 Nonallopathic lesion of thoracic region 08/20/2014  . Subacromial bursitis 07/16/2014  . Iliotibial band syndrome of right side 07/16/2014  . Chronic combined systolic and diastolic heart failure ([place]) 03/05/2014  . Closed low lateral malleolus fracture 10/23/2013  . Thoracic back pain 06/20/2013  . IBS (irritable bowel syndrome)   . Depression with anxiety 01/20/2013  . OSA (obstructive sleep apnea) 01/02/2013  . 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 Essential hypertension     Past Surgical History:  Procedure Laterality Date  . CARDIAC CATHETERIZATION N/A 11/11/2015   Procedure: Right/Left Heart Cath and Coronary Angiography;  Surgeon: Troy Sine, MD;  Location: [facility];  Service: Cardiovascular;  Laterality: N/A;  . CARDIAC CATHETERIZATION  ~ 2015  . CARDIAC DEFIBRILLATOR PLACEMENT  08/03/2016  . CESAREAN SECTION  1999  . COLONOSCOPY WITH PROPOFOL N/A 04/21/2016   Procedure: COLONOSCOPY WITH PROPOFOL;  Surgeon: Jerene Bears, MD;  [facility];  Service: Gastroenterology;  Laterality: N/A;  . ESOPHAGOGASTRODUODENOSCOPY (EGD) WITH PROPOFOL N/A 04/21/2016   Procedure: ESOPHAGOGASTRODUODENOSCOPY (EGD) WITH PROPOFOL;  Surgeon: Jerene Bears, MD;  [facility];  Service: Gastroenterology;  Laterality: N/A;  . FOOT FRACTURE SURGERY Right ~ 2003  . FRACTURE SURGERY    . ICD IMPLANT N/A 08/03/2016   Procedure: ICD Implant;  Surgeon: Remo Lipps  Peterson Lombard, MD;  Location: [facility];  Service: Cardiovascular;  Laterality: N/A;  . LAPAROSCOPIC CHOLECYSTECTOMY  12/2006  . LAPAROSCOPY ABDOMEN DIAGNOSTIC  2008   "cut bile duct w/gallbladder OR; had to go in later & fix leak; hospitalized for 2 months"  . LEFT HEART CATHETERIZATION WITH CORONARY ANGIOGRAM N/A 02/26/2014   Procedure: LEFT HEART CATHETERIZATION WITH  CORONARY ANGIOGRAM;  Surgeon: Jettie Booze, MD;  Location: [facility] C[facility] LAB;  Service: Cardiovascular;  Laterality: N/A;  . OVARIAN CYST REMOVAL Right 1999  . TUBAL LIGATION  1999    OB History    Gravida Para Term Preterm AB Living   2 1     1 1    SAB TAB Ectopic Multiple Live Births     1             Home Medications    Prior to Admission medications   Medication Sig Start Date End Date Taking? Authorizing Provider  albuterol (PROVENTIL HFA;VENTOLIN HFA) 108 (90 Base) MCG/ACT inhaler Inhale 2 puffs into the lungs every 6 (six) hours as needed for wheezing or shortness of breath. 08/19/16  Yes Golden Circle, FNP  Armodafinil (NUVIGIL) 250 MG tablet Take 250 mg by mouth daily.   Yes [provider]  BIOTIN PO Take 500 mcg by mouth daily.    Yes [provider]  budesonide-formoterol (SYMBICORT) 160-4.5 MCG/ACT inhaler Inhale 2 puffs into the lungs 2 (two) times daily.   Yes [provider]  carvedilol (COREG) 25 MG tablet Take 1 tablet (25 mg total) by mouth 2 (two) times daily with a meal. 01/10/16  Yes Belva Crome, MD  colestipol (COLESTID) 1 g tablet Take 2 tablets (2 g total) by mouth daily. 04/27/16  Yes Pyrtle, Lajuan Lines, MD  Diclofenac Sodium 2 % SOLN Apply 1 Pump topically 2 (two) times daily as needed (right shoulder and neck pain).    Yes [provider]  dicyclomine (BENTYL) 20 MG tablet Take 20 mg by mouth 2 (two) times daily as needed for spasms.   Yes [provider]  fluticasone (FLONASE) 50 MCG/ACT nasal spray PLACE 2 SPRAYS INTO BOTH NOSTRILS DAILY AS NEEDED FOR ALLERGIES. 12/21/16  Yes Golden Circle, FNP  gabapentin (NEURONTIN) 100 MG capsule Take 200 mg by mouth at bedtime as needed (pain).    Yes [provider]  ivabradine (CORLANOR) 5 MG TABS tablet Take 1.5 tablets (7.5 mg total) by mouth 2 (two) times daily with a meal. 07/10/16  Yes Bensimhon, Shaune Pascal, MD  magnesium oxide (MAG-OX) 400 MG tablet Take 1  tablet (400 mg total) by mouth daily. 11/20/16  Yes Deboraha Sprang, MD  ondansetron (ZOFRAN ODT) 4 MG disintegrating tablet Take 1 tablet (4 mg total) by mouth every 8 (eight) hours as needed for nausea or vomiting. 05/11/16  Yes Ward, Cyril Mourning N, DO  potassium chloride (K-DUR) 10 MEQ tablet Take 10 mEq by mouth daily.   Yes [provider]  PRESCRIPTION MEDICATION Inhale into the lungs at bedtime. CPAP   Yes [provider]  sacubitril-valsartan (ENTRESTO) 97-103 MG Take 1 tablet by mouth 2 (two) times daily. 02/19/16  Yes Belva Crome, MD  spironolactone (ALDACTONE) 25 MG tablet TAKE 1 TABLET BY MOUTH EVERY DAY Patient taking differently: TAKE 1 TABLET (25 MG) BY MOUTH EVERY DAY 11/20/16  Yes Lelon Perla, MD  torsemide (DEMADEX) 20 MG tablet Take 2 tablets (40 mg total) by mouth 2 (two) times daily.  Patient taking differently: Take 40 mg by mouth See admin instructions. Take 2 tablets (40 mg) by mouth twice daily, take an extra tablet (20 mg) daily as needed for weight gain of >3-4 lbs in 24 hours 12/15/16 03/15/17 Yes Belva Crome, MD  Turmeric 500 MG CAPS Take 500 mg by mouth daily.   Yes [provider]  venlafaxine XR (EFFEXOR-XR) 75 MG 24 hr capsule Take 225 mg by mouth daily with breakfast.   Yes [provider]  vitamin B-12 (CYANOCOBALAMIN) 500 MCG tablet Take 500 mcg by mouth daily.   Yes [provider]  Vitamin D, Ergocalciferol, (DRISDOL) 50000 units CAPS capsule Take 1 capsule (50,000 Units total) by mouth every 7 (seven) days. Patient taking differently: Take 50,000 Units by mouth every Wednesday.  10/16/16  Yes Hulan Saas M, DO  B-D ULTRAFINE III SHORT PEN 31G X 8 MM MISC USE TO TEST BLOOD SUGARS 1-2 TIMES A DAY AS NEEDED. 12/21/16   Golden Circle, FNP  cyclobenzaprine (FLEXERIL) 10 MG tablet Take 1 tablet (10 mg total) by mouth 3 (three) times daily as needed for muscle spasms. 01/08/17   Golden Circle, FNP  gentamicin  ointment (GARAMYCIN) 0.1 % Apply 1 application topically 3 (three) times daily. Patient not taking: Reported on 01/17/2017 12/24/16   Margarita Mail, PA-C  Insulin Pen Needle (NOVOFINE) 32G X 6 MM MISC Use to administer victoza every day 09/21/16   Golden Circle, FNP  potassium chloride SA (K-DUR,KLOR-CON) 20 MEQ tablet Take 1 tablet (20 mEq total) by mouth daily. 01/17/17   Julianne Rice, MD  predniSONE (DELTASONE) 20 MG tablet 3 tabs po day one, then 2 tabs daily x 4 days 01/17/17   Julianne Rice, MD  trimethoprim-polymyxin b (POLYTRIM) ophthalmic solution Place 1 drop into the left eye every 4 (four) hours. Patient not taking: Reported on 01/17/2017 12/24/16   Golden Circle, FNP  VICTOZA 18 MG/3ML SOPN Inject 0.3 mLs (1.8 mg total) into the skin daily. 01/08/17   Golden Circle, FNP    Family History Family History  Problem Relation Age of Onset  . Emphysema Maternal Grandmother        smoked  . Heart disease Maternal Grandmother 26       MI  . Rheum arthritis Mother   . Allergies Daughter   . Colon cancer Neg Hx     Social History Social History  Substance Use Topics  . Smoking status: Never Smoker  . Smokeless tobacco: Never Used  . Alcohol use No     Allergies   Vancomycin; Contrast media [iodinated diagnostic agents]; Aspirin; Ciprofloxacin; and Sulfa antibiotics   Review of Systems Review of Systems  Constitutional: Negative for chills and fever.  HENT: Negative for sore throat and trouble swallowing.   Eyes: Negative for visual disturbance.  Respiratory: Positive for cough, chest tightness, shortness of breath and wheezing.   Cardiovascular: Negative for chest pain, palpitations and leg swelling.  Gastrointestinal: Negative for abdominal pain, constipation, diarrhea, nausea and vomiting.  Genitourinary: Negative for difficulty urinating, dysuria and flank pain.  Musculoskeletal: Positive for myalgias. Negative for back pain, neck pain and neck stiffness.    Skin: Negative for rash and wound.  Neurological: Negative for dizziness, weakness, light-headedness, numbness and headaches.  All other systems reviewed and are negative.    Physical Exam Updated Vital Signs BP (!) 160/79   Pulse (!) 107   Temp 98.2 F (36.8 C) (Oral)   Resp (!) 26  Wt 108.2 kg (238 lb 8 oz)   LMP 12/29/2016   SpO2 100%   BMI 39.69 kg/m   Physical Exam  Constitutional: She is oriented to person, place, and time. She appears well-developed and well-nourished. No distress.  HENT:  Head: Normocephalic and atraumatic.  Mouth/Throat: Oropharynx is clear and moist. No oropharyngeal exudate.  Eyes: Pupils are equal, round, and reactive to light. EOM are normal.  Neck: Normal range of motion. Neck supple. No JVD present.  Cardiovascular: Normal rate and regular rhythm.  Exam reveals no gallop and no friction rub.   No murmur heard. Pulmonary/Chest: Effort normal.  Decreased air movement with prolonged expiratory phase.  Abdominal: Soft. Bowel sounds are normal. There is no tenderness. There is no rebound and no guarding.  Musculoskeletal: Normal range of motion. She exhibits edema. She exhibits no tenderness.  1+ bilateral lower extremity edema. No calf swelling or asymmetry. Distal pulses are 2+.  Neurological: She is alert and oriented to person, place, and time.  Moves all extremities without focal deficit. Sensation intact.  Skin: Skin is warm and dry. No rash noted. No erythema.  Psychiatric: She has a normal mood and affect. Her behavior is normal.  Nursing note and vitals reviewed.    ED Treatments / Results  Labs (all labs ordered are listed, but only abnormal results are displayed) Labs Reviewed  BASIC METABOLIC PANEL - Abnormal; Notable for the following:       Result Value   Potassium 3.2 (*)    Glucose, Bld 102 (*)    BUN <5 (*)    All other components within normal limits  CBC - Abnormal; Notable for the following:    Hemoglobin 11.8 (*)     All other components within normal limits  BRAIN NATRIURETIC PEPTIDE - Abnormal; Notable for the following:    B Natriuretic Peptide 298.0 (*)    All other components within normal limits  I-STAT TROPONIN, ED  I-STAT TROPONIN, ED    EKG  EKG Interpretation None       Radiology Dg Chest 2 View  Result Date: 01/17/2017 CLINICAL DATA:  Shortness of breath and chest tightness since yesterday. EXAM: CHEST  2 VIEW COMPARISON:  08/04/2016. FINDINGS: Mild cardiac enlargement, likely LEFT ventricular hypertrophy. Single lead defibrillator from a LEFT subclavian approach lies with its tip in the RV. Lung fields are clear. No effusion or pneumothorax. Bones unremarkable. Similar appearance to priors. IMPRESSION: No active cardiopulmonary disease. Electronically Signed   By: Staci Righter M.D.   On: 01/17/2017 14:54    Procedures Procedures (including critical care time)  Medications Ordered in ED Medications  potassium chloride SA (K-DUR,KLOR-CON) CR tablet 40 mEq (40 mEq Oral Given 01/17/17 1731)  furosemide (LASIX) injection 60 mg (60 mg Intravenous Given 01/17/17 1731)  albuterol (PROVENTIL) (2.5 MG/3ML) 0.083% nebulizer solution 5 mg (5 mg Nebulization Given 01/17/17 1731)  methylPREDNISolone sodium succinate (SOLU-MEDROL) 125 mg/2 mL injection 125 mg (125 mg Intravenous Given 01/17/17 1937)  albuterol (PROVENTIL) (2.5 MG/3ML) 0.083% nebulizer solution 5 mg (5 mg Nebulization Given 01/17/17 1937)  potassium chloride SA (K-DUR,KLOR-CON) CR tablet 40 mEq (40 mEq Oral Given 01/17/17 2115)     Initial Impression / Assessment and Plan / ED Course  I have reviewed the triage vital signs and the nursing notes.  Pertinent labs & imaging results that were available during my care of the patient were reviewed by me and considered in my medical decision making (see chart for details).  Patient states she is feeling much better after 2 nebulized albuterol treatments and Lasix. She is  ambulating without any difficulty or shortness of breath. Troponin 2 is normal. Patient last had a normal cath last year. Discharge home to follow-up with her primary doctor and cardiologist.  Final Clinical Impressions(s) / ED Diagnoses   Final diagnoses:  Exacerbation of intermittent asthma, unspecified asthma severity  Hypokalemia    New Prescriptions Discharge Medication List as of 01/17/2017  9:58 PM    START taking these medications   Details  potassium chloride SA (K-DUR,KLOR-CON) 20 MEQ tablet Take 1 tablet (20 mEq total) by mouth daily., Starting Sun 01/17/2017, Print    predniSONE (DELTASONE) 20 MG tablet 3 tabs po day one, then 2 tabs daily x 4 days, Print         Julianne Rice, MD 01/17/17 2342

## 2017-01-17 NOTE — Telephone Encounter (Signed)
Pt w/ increasing DOE, wt up to 240 lbs, increased abd swelling, legs have cramping pain. Arms hurting also, has a HA.   She took an extra Demedex yesterday, but no better.   She is concerned, is home alone. She feels very bad, is not sure what to do.   Encouraged her to come to the ER to be evaluated.  Pt says she will do so.  Rosaria Ferries, Hershal Coria 01/17/2017 11:57 AM [facility] [phone]

## 2017-01-17 NOTE — ED Notes (Addendum)
Pt ambulating without SOB or dizziness oxygen at 100%

## 2017-01-17 NOTE — ED Triage Notes (Addendum)
Onset yesterday morning woke up coughing, weight at home 240 lbs  up 10 lbs from day before.  Pt took extra Torsemide 20 mg. Pt slept sitting up, still coughing.  Pt weighed 238 lbs this morning, called cardiologist, referred to ED.  Pt still coughing.  Sitting up makes shortness of breath better.  Abd feels swollen, legs and feet tingling/cramping.  Pt able to talk in complete sentences, breathing unlabored.  Chest tightness.

## 2017-01-18 ENCOUNTER — Telehealth (HOSPITAL_COMMUNITY): Payer: Self-pay | Admitting: *Deleted

## 2017-01-18 MED ORDER — POTASSIUM CHLORIDE CRYS ER 20 MEQ PO TBCR: 20.0000 meq | EXTENDED_RELEASE_TABLET | Freq: Every day | ORAL | 3 refills | Status: DC

## 2017-01-18 NOTE — Telephone Encounter (Signed)
-----   Message from [name], RN sent at 01/13/2017  2:24 PM EDT ----- Left message to call back

## 2017-01-21 ENCOUNTER — Telehealth: Payer: Self-pay

## 2017-01-21 NOTE — Telephone Encounter (Signed)
Attempted ICM patient call and left message to return call.

## 2017-01-28 ENCOUNTER — Telehealth: Payer: Self-pay

## 2017-01-28 NOTE — Progress Notes (Signed)
No ICM remote transmission received for 01/21/2017 and next ICM transmission scheduled for 02/04/2017.

## 2017-01-28 NOTE — Telephone Encounter (Signed)
Attempted ICM call to patient due to [facility] Heartfailure index is above threshold at 53 on 01/26/2017 report.  No answer and left message to return call.  Next ICM remote scheduled for 02/04/2017.

## 2017-02-01 ENCOUNTER — Inpatient Hospital Stay (HOSPITAL_COMMUNITY): Admission: RE | Admit: 2017-02-01 | Payer: BLUE CROSS/BLUE SHIELD | Source: Ambulatory Visit

## 2017-02-01 ENCOUNTER — Other Ambulatory Visit (HOSPITAL_COMMUNITY): Payer: Self-pay | Admitting: *Deleted

## 2017-02-01 DIAGNOSIS — I5042 Chronic combined systolic (congestive) and diastolic (congestive) heart failure: Secondary | ICD-10-CM

## 2017-02-02 ENCOUNTER — Inpatient Hospital Stay (HOSPITAL_COMMUNITY): Admission: RE | Admit: 2017-02-02 | Payer: BLUE CROSS/BLUE SHIELD | Source: Ambulatory Visit

## 2017-02-03 ENCOUNTER — Ambulatory Visit (HOSPITAL_COMMUNITY)
Admission: RE | Admit: 2017-02-03 | Discharge: 2017-02-03 | Disposition: A | Discharge status: Home / Self Care | Payer: BLUE CROSS/BLUE SHIELD | Source: Ambulatory Visit | Attending: Cardiology | Admitting: Cardiology

## 2017-02-03 DIAGNOSIS — I5042 Chronic combined systolic (congestive) and diastolic (congestive) heart failure: Secondary | ICD-10-CM

## 2017-02-03 LAB — BASIC METABOLIC PANEL
Anion gap: 6 (ref 5–15)
BUN: 5 mg/dL — ABNORMAL LOW (ref 6–20)
CO2: 25 mmol/L (ref 22–32)
Calcium: 9 mg/dL (ref 8.9–10.3)
Chloride: 106 mmol/L (ref 101–111)
Creatinine, Ser: 0.84 mg/dL (ref 0.44–1.00)
GFR calc Af Amer: >60 mL/min (ref >60)
GFR calc non Af Amer: >60 mL/min (ref >60)
Glucose, Bld: 102 mg/dL — ABNORMAL HIGH (ref 65–99)
Potassium: 3.3 mmol/L — ABNORMAL LOW (ref 3.5–5.1)
Sodium: 137 mmol/L (ref 135–145)

## 2017-02-04 ENCOUNTER — Telehealth (HOSPITAL_COMMUNITY): Payer: Self-pay | Admitting: *Deleted

## 2017-02-04 ENCOUNTER — Ambulatory Visit (INDEPENDENT_AMBULATORY_CARE_PROVIDER_SITE_OTHER): Payer: BLUE CROSS/BLUE SHIELD

## 2017-02-04 DIAGNOSIS — I5042 Chronic combined systolic (congestive) and diastolic (congestive) heart failure: Secondary | ICD-10-CM

## 2017-02-04 DIAGNOSIS — E876 Hypokalemia: Secondary | ICD-10-CM

## 2017-02-04 DIAGNOSIS — Z95810 Presence of automatic (implantable) cardiac defibrillator: Secondary | ICD-10-CM

## 2017-02-04 MED ORDER — POTASSIUM CHLORIDE CRYS ER 20 MEQ PO TBCR: 40.0000 meq | EXTENDED_RELEASE_TABLET | Freq: Every day | ORAL | 3 refills | Status: DC

## 2017-02-04 NOTE — Telephone Encounter (Signed)
Notes recorded by [name], RN on 02/04/2017 at 2:31 PM EDT Per Dr Aundra Dubin increase KCL to 40 meq (2 tabs) daily, spoke w/pt she is aware and agreeable. Repeat labs 9/24

## 2017-02-04 NOTE — Telephone Encounter (Signed)
-----   Message from Larey Dresser, MD sent at 02/03/2017  4:13 PM EDT ----- Add KCl 20 daily to her regimen. BMET 10 days.

## 2017-02-05 ENCOUNTER — Telehealth: Payer: Self-pay

## 2017-02-05 NOTE — Progress Notes (Addendum)
Discharge Progress Report  Patient Details  Name: [name] MRN: [id number] Date of Birth: 01/19/1976 Referring Provider:     [facility] from 09/10/2016 in [place]  Referring Provider  Glori Bickers MD       Number of Visits: 21 Reason for Discharge:  Patient reached a stable level of exercise.  Pt reached maximum time for program participation.  Pt frequent absences for symptoms, fatigue and personal reasons.   Smoking History:  History  Smoking Status  . Never Smoker  Smokeless Tobacco  . Never Used    Diagnosis:  NICM (nonischemic cardiomyopathy) ([place])  ADL UCSD:   Initial Exercise Prescription:     Initial Exercise Prescription - 09/10/16 1200      Date of Initial Exercise RX and Referring Provider   Date 09/10/16   Referring Provider Glori Bickers MD     Treadmill   MPH 2.4   Grade 1   Minutes 10   METs 3.08     Recumbant Bike   Level 2   Minutes 10   METs 2     NuStep   Level 3   SPM 70   Minutes 10   METs 2     Intensity   THRR 40-80% of Max Heartrate 71-142   Ratings of Perceived Exertion 11-13   Perceived Dyspnea 0-4     Progression   Progression Continue to progress workloads to maintain intensity without signs/symptoms of physical distress.     Resistance Training   Training Prescription Yes   Weight 1lb   Reps 10-15      Discharge Exercise Prescription (Final Exercise Prescription Changes):     Exercise Prescription Changes - 01/06/17 1700      Response to Exercise   Blood Pressure (Admit) 108/76   Blood Pressure (Exercise) 112/70   Blood Pressure (Exit) 102/60   Heart Rate (Admit) 99 bpm   Heart Rate (Exercise) 130 bpm   Heart Rate (Exit) 98 bpm   Rating of Perceived Exertion (Exercise) 14   Duration Continue with 30 min of aerobic exercise without signs/symptoms of physical distress.   Intensity THRR unchanged     Progression   Progression  Continue to progress workloads to maintain intensity without signs/symptoms of physical distress.   Average METs 3.2     Resistance Training   Training Prescription Yes   Weight 5lbs   Reps 10-15   Time 10 Minutes     Treadmill   MPH 2.5   Grade 3   Minutes 15   METs 3.95     NuStep   Level 5   SPM 90   Minutes 15   METs 2.4     Home Exercise Plan   Plans to continue exercise at Home (comment)  walking   Frequency Add 2 additional days to program exercise sessions.   Initial Home Exercises Provided 09/25/16      Functional Capacity:     6 Minute Walk    Row Name 09/10/16 1134 09/10/16 1138       6 Minute Walk   Phase Initial  -    Distance 1562 feet  -    Walk Time 6 minutes  -    # of Rest Breaks 0  -    MPH  - 2.96    METS  - 4.65    RPE 11  -    VO2 Peak  - 16.27  Symptoms No  -    Resting HR 97 bpm  -    Resting BP 133/86  -    Max Ex. HR 127 bpm  -    Max Ex. BP 153/96  -    2 Minute Post BP 139/97  -       Psychological, QOL, Others - Outcomes: PHQ 2/9: Depression screen [facility] 2/9 09/16/2016 02/16/2015  Decreased Interest 2 0  Down, Depressed, Hopeless 2 0  PHQ - 2 Score 4 0  Altered sleeping 2 -  Tired, decreased energy 2 -  Change in appetite 2 -  Feeling bad or failure about yourself  2 -  Trouble concentrating 2 -  Moving slowly or fidgety/restless 0 -  Suicidal thoughts 0 -  PHQ-9 Score 14 -  Difficult doing work/chores Very difficult -  Some recent data might be hidden    Quality of Life:     Quality of Life - 09/23/16 1631      Quality of Life Scores   Health/Function Pre 5.77 %  pt QOL severely altered by her physical disablity and social isolation from recent cardiac events.  health related anxiety and pt symptoms make it difficult for her to engage in day to day activities. pt is encouraged to participate in CR to decrease sx.   Socioeconomic Pre 16.86 %   Psych/Spiritual Pre 9.57 %   Family Pre 26.1 %   GLOBAL Pre 11.82  %  pt admits to feelings of helplessness and hopelessness. pt denies suicidal tendancies and reports improvement in her perspective and outlook recently.  pt is currenlty under psychiatric care seeking new counselor. list of area counselors provided to her       Personal Goals: Goals established at orientation with interventions provided to work toward goal.     Personal Goals and Risk Factors at Admission - 09/10/16 1139      Core Components/Risk Factors/Patient Goals on Admission    Weight Management Yes;Obesity;Weight Maintenance;Weight Loss   Intervention Weight Management/Obesity: Establish reasonable short term and long term weight goals.;Weight Management: Provide education and appropriate resources to help participant work on and attain dietary goals.;Weight Management: Develop a combined nutrition and exercise program designed to reach desired caloric intake, while maintaining appropriate intake of nutrient and fiber, sodium and fats, and appropriate energy expenditure required for the weight goal.;Obesity: Provide education and appropriate resources to help participant work on and attain dietary goals.   Expected Outcomes Short Term: Continue to assess and modify interventions until short term weight is achieved;Long Term: Adherence to nutrition and physical activity/exercise program aimed toward attainment of established weight goal;Weight Maintenance: Understanding of the daily nutrition guidelines, which includes 25-35% calories from fat, 7% or less cal from saturated fats, less than 200mg  cholesterol, less than 1.5gm of sodium, & 5 or more servings of fruits and vegetables daily;Weight Loss: Understanding of general recommendations for a balanced deficit meal plan, which promotes 1-2 lb weight loss per week and includes a negative energy balance of [phone] kcal/d;Understanding recommendations for meals to include 15-35% energy as protein, 25-35% energy from fat, 35-60% energy from  carbohydrates, less than 200mg  of dietary cholesterol, 20-35 gm of total fiber daily;Understanding of distribution of calorie intake throughout the day with the consumption of 4-5 meals/snacks   Diabetes Yes   Intervention Provide education about signs/symptoms and action to take for hypo/hyperglycemia.;Provide education about proper nutrition, including hydration, and aerobic/resistive exercise prescription along with prescribed medications to achieve blood glucose in normal  ranges: Fasting glucose 65-99 mg/dL   Expected Outcomes Short Term: Participant verbalizes understanding of the signs/symptoms and immediate care of hyper/hypoglycemia, proper foot care and importance of medication, aerobic/resistive exercise and nutrition plan for blood glucose control.;Long Term: Attainment of HbA1C < 7%.   Heart Failure Yes   Intervention Provide a combined exercise and nutrition program that is supplemented with education, support and counseling about heart failure. Directed toward relieving symptoms such as shortness of breath, decreased exercise tolerance, and extremity edema.   Expected Outcomes Improve functional capacity of life;Short term: Attendance in program 2-3 days a week with increased exercise capacity. Reported lower sodium intake. Reported increased fruit and vegetable intake. Reports medication compliance.;Short term: Daily weights obtained and reported for increase. Utilizing diuretic protocols set by physician.;Long term: Adoption of self-care skills and reduction of barriers for early signs and symptoms recognition and intervention leading to self-care maintenance.   Hypertension Yes   Intervention Provide education on lifestyle modifcations including regular physical activity/exercise, weight management, moderate sodium restriction and increased consumption of fresh fruit, vegetables, and low fat dairy, alcohol moderation, and smoking cessation.;Monitor prescription use compliance.   Expected  Outcomes Short Term: Continued assessment and intervention until BP is < 140/21mm HG in hypertensive participants. < 130/51mm HG in hypertensive participants with diabetes, heart failure or chronic kidney disease.;Long Term: Maintenance of blood pressure at goal levels.   Stress Yes   Intervention Offer individual and/or small group education and counseling on adjustment to heart disease, stress management and health-related lifestyle change. Teach and support self-help strategies.;Refer participants experiencing significant psychosocial distress to appropriate mental health specialists for further evaluation and treatment. When possible, include family members and significant others in education/counseling sessions.   Expected Outcomes Short Term: Participant demonstrates changes in health-related behavior, relaxation and other stress management skills, ability to obtain effective social support, and compliance with psychotropic medications if prescribed.;Long Term: Emotional wellbeing is indicated by absence of clinically significant psychosocial distress or social isolation.       Personal Goals Discharge:     Goals and Risk Factor Review    Row Name 10/09/16 1101 11/10/16 1629 12/02/16 1123 01/07/17 1127 01/19/17 1147     Core Components/Risk Factors/Patient Goals Review   Personal Goals Review Weight Management/Obesity;Diabetes;Heart Failure;Hypertension;Stress Weight Management/Obesity;Diabetes;Heart Failure;Hypertension;Stress Weight Management/Obesity;Diabetes;Heart Failure;Hypertension;Stress Weight Management/Obesity;Diabetes;Heart Failure;Hypertension;Stress  -   Review pt with multiple risk factors demonstrates eagerness to participate in CR activities.  pt is encouraged by her increased strength/stamina which allowed to participate in social outings. pt congratulated on this success.  pt demonstrates good diabetes, heart failure and hypertension self care practices.   pt with multiple  risk factors demonstrates eagerness to participate in CR activities.  pt demonstrates good diabetes, heart failure and hypertension self care practices.   pt with multiple risk factors demonstrates eagerness to participate in CR activities.  pt demonstrates good diabetes, heart failure and hypertension self care practices.     pt demonstrates good diabetes, heart failure and hypertension self care practices.  pt CR participation sporadic at times.  pt completed cardiac rehab program with 21 sessions.    Expected Outcomes t will participate in CR exercise, nutrition and lifestyle education opportunities to decrease overall CAD risk factors.  t will participate in CR exercise, nutrition and lifestyle education opportunities to decrease overall CAD risk factors.  t will participate in CR exercise, nutrition and lifestyle education opportunities to decrease overall CAD risk factors.  pt  will participate in CR exercise, nutrition and lifestyle  education opportunities to decrease overall CAD risk factors.  pt will continue exercise, nutrition and lifestyle modification to decrease overall risk factors.        Exercise Goals and Review:     Exercise Goals    Row Name 09/10/16 7517 09/10/16 1156           Exercise Goals   Increase Physical Activity (P)  Yes Yes      Intervention (P)  Provide advice, education, support and counseling about physical activity/exercise needs.;Develop an individualized exercise prescription for aerobic and resistive training based on initial evaluation findings, risk stratification, comorbidities and participant's personal goals. Provide advice, education, support and counseling about physical activity/exercise needs.;Develop an individualized exercise prescription for aerobic and resistive training based on initial evaluation findings, risk stratification, comorbidities and participant's personal goals.      Expected Outcomes (P)  Achievement of increased cardiorespiratory  fitness and enhanced flexibility, muscular endurance and strength shown through measurements of functional capacity and personal statement of participant. Achievement of increased cardiorespiratory fitness and enhanced flexibility, muscular endurance and strength shown through measurements of functional capacity and personal statement of participant.      Increase Strength and Stamina (P)  Yes Yes      Intervention (P)  Develop an individualized exercise prescription for aerobic and resistive training based on initial evaluation findings, risk stratification, comorbidities and participant's personal goals.;Provide advice, education, support and counseling about physical activity/exercise needs. Provide advice, education, support and counseling about physical activity/exercise needs.;Develop an individualized exercise prescription for aerobic and resistive training based on initial evaluation findings, risk stratification, comorbidities and participant's personal goals.      Expected Outcomes (P)  Achievement of increased cardiorespiratory fitness and enhanced flexibility, muscular endurance and strength shown through measurements of functional capacity and personal statement of participant. Achievement of increased cardiorespiratory fitness and enhanced flexibility, muscular endurance and strength shown through measurements of functional capacity and personal statement of participant.         Nutrition & Weight - Outcomes:     Pre Biometrics - 09/10/16 1139      Pre Biometrics   Waist Circumference 43 inches   Hip Circumference 51 inches   Waist to Hip Ratio 0.84 %   Triceps Skinfold 58 mm   % Body Fat 50 %   Grip Strength 36 kg   Flexibility 14 in   Single Leg Stand 17.75 seconds       Nutrition:   Nutrition Discharge:   Education Questionnaire Score:     Knowledge Questionnaire Score - 09/10/16 1133      Knowledge Questionnaire Score   Pre Score 23/24      Goals reviewed  with patient; copy given to patient.

## 2017-02-05 NOTE — Progress Notes (Signed)
EPIC Encounter for ICM Monitoring  Patient Name: [name] is a 41 y.o. female Date: 02/05/2017 Primary Care Physican: [facility], [place] Primary Cardiologist: Smith/Bensimhon Electrophysiologist: Faustino Congress Weight:Previous weight 234 lbs            Attempted call to patient and unable to reach.  Left message to return call.  Transmission reviewed.                   Prescribed dosage: Torsemide 20 mg take 2 tablets (40 mg) by mouth twice daily, take an extra tablet (20 mg) daily as needed for weight gain of >3-4 lbs in 24 hours   Potassium 20 mEq 2 tablets (40 mEq total) daily.  Labs: 10/07/2016 Creatinine 0.88, BUN 7, Potassium 4.1, Sodium 137, EGFR >60 07/30/2016 Creatinine 0.95, BUN 13, Potassium 3.8, Sodium 138, EGFR 74-85  06/24/2016 Creatinine 0.93, BUN 8, Potassium 3.5, Sodium 138, EGFR >60  06/22/2016 Creatinine 0.77, BUN 6, Potassium 3.9, Sodium 141, EGFR 96-110  05/28/2016 Creatinine 0.81, BUN 7, Potassium 3.4, Sodium 139  Recommendations: NONE - Unable to reach patient   Follow-up plan: ICM clinic phone appointment on 02/11/2017.    Copy of ICM check sent to primary cardiologist and device physician.    8 Day Trend Data   6 Month Trend

## 2017-02-05 NOTE — Addendum Note (Signed)
Encounter addended by: Lowell Guitar, RN on: 02/05/2017  2:03 PM<BR>    Actions taken: Flowsheet data copied forward, Visit Navigator Flowsheet section accepted, Sign clinical note

## 2017-02-05 NOTE — Telephone Encounter (Signed)
Remote ICM transmission received.  Attempted call to patient and left message to return call. 

## 2017-02-10 NOTE — Progress Notes (Deleted)
[name] [address], [place] [address] Phone: [phone] Subjective:    I'm seeing this patient by the request  of:    CC:   [id number]  CYNAI [name] is a 41 y.o. female coming in with complaint of ***  Onset-  Location Duration-  Character- Aggravating factors- Reliving factors-  Therapies tried-  Severity-     Past Medical History:  Diagnosis Date  . AICD (automatic cardioverter/defibrillator) present   . Anxiety   . Arthritis    right shoulder   . Asthma   . CHF (congestive heart failure) ([place])   . Depression   . Diabetes mellitus without complication ([place])   . Dyspnea    comes and goes intermittently mostly with exertion   . Fibroid    age 39  . Gallstones   . Hypertension   . IBS (irritable bowel syndrome)   . Migraine    "monthly" (08/03/2016)  . OSA on CPAP   . Ovarian cyst    1999; surgically removed  . 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 Seizures ([place])    "as a child" (08/03/2016)  . Termination of pregnancy    due to cardiac risk   Past Surgical History:  Procedure Laterality Date  . CARDIAC CATHETERIZATION N/A 11/11/2015   Procedure: Right/Left Heart Cath and Coronary Angiography;  Surgeon: Troy Sine, MD;  Location: [facility];  Service: Cardiovascular;  Laterality: N/A;  . CARDIAC CATHETERIZATION  ~ 2015  . CARDIAC DEFIBRILLATOR PLACEMENT  08/03/2016  . CESAREAN SECTION  1999  . COLONOSCOPY WITH PROPOFOL N/A 04/21/2016   Procedure: COLONOSCOPY WITH PROPOFOL;  Surgeon: Jerene Bears, MD;  Location: [facility];  Service: Gastroenterology;  Laterality: N/A;  . ESOPHAGOGASTRODUODENOSCOPY (EGD) WITH PROPOFOL N/A 04/21/2016   Procedure: ESOPHAGOGASTRODUODENOSCOPY (EGD) WITH PROPOFOL;  Surgeon: Jerene Bears, MD;  Location: [facility];  Service: Gastroenterology;  Laterality: N/A;  . FOOT FRACTURE SURGERY Right ~ 2003  . FRACTURE SURGERY    . ICD IMPLANT N/A  08/03/2016   Procedure: ICD Implant;  Surgeon: Deboraha Sprang, MD;  Location: [facility];  Service: Cardiovascular;  Laterality: N/A;  . LAPAROSCOPIC CHOLECYSTECTOMY  12/2006  . LAPAROSCOPY ABDOMEN DIAGNOSTIC  2008   "cut bile duct w/gallbladder OR; had to go in later & fix leak; hospitalized for 2 months"  . LEFT HEART CATHETERIZATION WITH CORONARY ANGIOGRAM N/A 02/26/2014   Procedure: LEFT HEART CATHETERIZATION WITH CORONARY ANGIOGRAM;  Surgeon: Jettie Booze, MD;  Location: [facility] C[facility] LAB;  Service: Cardiovascular;  Laterality: N/A;  . OVARIAN CYST REMOVAL Right 1999  . TUBAL LIGATION  1999   Social History   Social History  . Marital status: Married    Spouse name: N/A  . Number of children: 1  . Years of education: N/A   Occupational History  . stay at home mom    Social History Main Topics  . Smoking status: Never Smoker  . Smokeless tobacco: Never Used  . Alcohol use No  . Drug use: No  . Sexual activity: Yes   Other Topics Concern  . Not on file   Social History Narrative  . No narrative on file   Allergies  Allergen Reactions  . Vancomycin Other (See Comments)    "did something to my kidneys"; went into kidney failure  . Contrast Media [Iodinated Diagnostic Agents] Other (See Comments)    Multiple CT contrast studies  done over 2 weeks causing ARF  . Aspirin Other (See Comments)    Wheezing; but patient still takes if she needs to  . Ciprofloxacin Itching and Rash  . Sulfa Antibiotics Itching and Rash   Family History  Problem Relation Age of Onset  . Emphysema Maternal Grandmother        smoked  . Heart disease Maternal Grandmother 78       MI  . Rheum arthritis Mother   . Allergies Daughter   . Colon cancer Neg Hx      Past medical history, social, surgical and family history all reviewed in electronic medical record.  No pertanent information unless stated regarding to the chief complaint.   Review of Systems:Review of systems updated and  as accurate as of 02/10/17  No headache, visual changes, nausea, vomiting, diarrhea, constipation, dizziness, abdominal pain, skin rash, fevers, chills, night sweats, weight loss, swollen lymph nodes, body aches, joint swelling, muscle aches, chest pain, shortness of breath, mood changes.   Objective  There were no vitals taken for this visit. Systems examined below as of 02/10/17   General: No apparent distress alert and oriented x3 mood and affect normal, dressed appropriately.  HEENT: Pupils equal, extraocular movements intact  Respiratory: [name] speak in full sentences and does not appear short of breath  Cardiovascular: No lower extremity edema, non tender, no erythema  Skin: Warm dry intact with no signs of infection or rash on extremities or on axial skeleton.  Abdomen: Soft nontender  Neuro: Cranial nerves II through XII are intact, neurovascularly intact in all extremities with 2+ DTRs and 2+ pulses.  Lymph: No lymphadenopathy of posterior or anterior cervical chain or axillae bilaterally.  Gait normal with good balance and coordination.  MSK:  Non tender with full range of motion and good stability and symmetric strength and tone of shoulders, elbows, wrist, hip, knee and ankles bilaterally.     Impression and Recommendations:     This case required medical decision making of moderate complexity.      Note: This dictation was prepared with Dragon dictation along with smaller phrase technology. Any transcriptional errors that result from this process are unintentional.

## 2017-02-11 ENCOUNTER — Ambulatory Visit (INDEPENDENT_AMBULATORY_CARE_PROVIDER_SITE_OTHER): Payer: Self-pay

## 2017-02-11 ENCOUNTER — Telehealth: Payer: Self-pay | Admitting: Cardiology

## 2017-02-11 ENCOUNTER — Ambulatory Visit: Payer: Self-pay | Admitting: Family Medicine

## 2017-02-11 DIAGNOSIS — I5042 Chronic combined systolic (congestive) and diastolic (congestive) heart failure: Secondary | ICD-10-CM

## 2017-02-11 DIAGNOSIS — Z95810 Presence of automatic (implantable) cardiac defibrillator: Secondary | ICD-10-CM

## 2017-02-11 NOTE — Telephone Encounter (Signed)
LMOVM reminding pt to send remote transmission.   

## 2017-02-15 NOTE — Progress Notes (Signed)
EPIC Encounter for ICM Monitoring  Patient Name: [name] is a 41 y.o. female Date: 02/15/2017 Primary Care Physican: [facility], [place] Primary Cardiologist: Smith/B[facility]hon Electrophysiologist: Faustino Congress Weight:Previous weight 234lbs       Attempted call to patient and unable to reach.  Left message to return call.  Transmission reviewed.      Prescribed dosage: Torsemide 20 mg take 2 tablets (40 mg) by mouth twice daily, take an extra tablet (20 mg) daily as needed for weight gain of >3-4 lbs in 24 hours  Potassium 20 mEq 2 tablets (40 mEq total) daily.  Labs: 10/07/2016 Creatinine 0.88, BUN 7, Potassium 4.1, Sodium 137, EGFR >60 07/30/2016 Creatinine 0.95, BUN 13, Potassium 3.8, Sodium 138, EGFR 74-85  06/24/2016 Creatinine 0.93, BUN 8, Potassium 3.5, Sodium 138, EGFR >60  06/22/2016 Creatinine 0.77, BUN 6, Potassium 3.9, Sodium 141, EGFR 96-110  05/28/2016 Creatinine 0.81, BUN 7, Potassium 3.4, Sodium 139  Recommendations: NONE - Unable to reach patient   Follow-up plan: ICM clinic phone appointment on 03/09/2017.  Copy of ICM check sent to primary cardiologist and device physician.   8 Day Trend Data  6 Month Trend

## 2017-02-22 ENCOUNTER — Ambulatory Visit (INDEPENDENT_AMBULATORY_CARE_PROVIDER_SITE_OTHER): Payer: BLUE CROSS/BLUE SHIELD | Admitting: *Deleted

## 2017-02-22 DIAGNOSIS — I428 Other cardiomyopathies: Secondary | ICD-10-CM

## 2017-02-23 NOTE — Progress Notes (Signed)
Remote ICD transmission.   

## 2017-02-24 ENCOUNTER — Encounter: Payer: Self-pay | Admitting: Cardiology

## 2017-02-26 LAB — CUP PACEART REMOTE DEVICE CHECK
Battery Remaining Longevity: 180 mo
Brady Statistic RV Percent Paced: 0 %
Date Time Interrogation Session: 20181005091607
HighPow Impedance: 63 Ohm
Implantable Lead Implant Date: 20180312
Implantable Lead Location: 753860
Implantable Lead Model: 293
Implantable Lead Serial Number: 422842
Implantable Pulse Generator Implant Date: 20180312
Lead Channel Impedance Value: 621 Ohm
Lead Channel Sensing Intrinsic Amplitude: 14.7 mV
Lead Channel Setting Pacing Amplitude: 2.5 V
Lead Channel Setting Pacing Pulse Width: 0.4 ms
Lead Channel Setting Sensing Sensitivity: 0.5 mV
Pulse Gen Serial Number: 226601

## 2017-03-09 ENCOUNTER — Ambulatory Visit (INDEPENDENT_AMBULATORY_CARE_PROVIDER_SITE_OTHER): Payer: BLUE CROSS/BLUE SHIELD

## 2017-03-09 ENCOUNTER — Telehealth: Payer: Self-pay

## 2017-03-09 DIAGNOSIS — I5042 Chronic combined systolic (congestive) and diastolic (congestive) heart failure: Secondary | ICD-10-CM

## 2017-03-09 DIAGNOSIS — Z95810 Presence of automatic (implantable) cardiac defibrillator: Secondary | ICD-10-CM

## 2017-03-09 NOTE — Telephone Encounter (Signed)
Remote ICM transmission received.  Attempted call to patient and left message to return call. 

## 2017-03-09 NOTE — Progress Notes (Signed)
EPIC Encounter for ICM Monitoring  Patient Name: [name] is a 41 y.o. female Date: 03/09/2017 Primary Care Physican: [facility] Circle, [place] Primary Cardiologist: Smith/Bensimhon Electrophysiologist: Faustino Congress Weight:Previous weight234lbs          Attempted call to patient and unable to reach.  Left message to return call.  Transmission reviewed.    Heartlogic HF Index: 11     Prescribed dosage: Torsemide 20 mg take 2 tablets (40 mg) by mouth twice daily, take an extra tablet (20 mg) daily as needed for weight gain of >3-4 lbs in 24 hours Potassium 20 mEq 2tablets (40 mEq total) daily.  Labs: 10/07/2016 Creatinine 0.88, BUN 7, Potassium 4.1, Sodium 137, EGFR >60 07/30/2016 Creatinine 0.95, BUN 13, Potassium 3.8, Sodium 138, EGFR 74-85  06/24/2016 Creatinine 0.93, BUN 8, Potassium 3.5, Sodium 138, EGFR >60  06/22/2016 Creatinine 0.77, BUN 6, Potassium 3.9, Sodium 141, EGFR 96-110  05/28/2016 Creatinine 0.81, BUN 7, Potassium 3.4, Sodium 139  Recommendations: NONE - Unable to reach patient   Follow-up plan: ICM clinic phone appointment on 04/12/2017.    Copy of ICM check sent to Dr Caryl Comes.    8 Day Trend Data  6 Month Trend

## 2017-03-12 ENCOUNTER — Encounter: Payer: Self-pay | Admitting: Cardiology

## 2017-03-15 ENCOUNTER — Other Ambulatory Visit: Payer: Self-pay | Admitting: Family

## 2017-03-19 ENCOUNTER — Other Ambulatory Visit: Payer: Self-pay | Admitting: Family Medicine

## 2017-03-30 ENCOUNTER — Ambulatory Visit (INDEPENDENT_AMBULATORY_CARE_PROVIDER_SITE_OTHER): Payer: BLUE CROSS/BLUE SHIELD | Admitting: Nurse Practitioner

## 2017-03-30 ENCOUNTER — Encounter: Payer: Self-pay | Admitting: Nurse Practitioner

## 2017-03-30 ENCOUNTER — Telehealth: Payer: Self-pay | Admitting: Internal Medicine

## 2017-03-30 VITALS — BP 140/102 | HR 100 | Temp 98.3°F | Ht 65.0 in | Wt 238.0 lb

## 2017-03-30 DIAGNOSIS — J00 Acute nasopharyngitis [common cold]: Secondary | ICD-10-CM

## 2017-03-30 DIAGNOSIS — J029 Acute pharyngitis, unspecified: Secondary | ICD-10-CM | POA: Diagnosis not present

## 2017-03-30 LAB — POCT RAPID STREP A (OFFICE): Rapid Strep A Screen: NEGATIVE

## 2017-03-30 MED ORDER — GUAIFENESIN ER 600 MG PO TB12: 600.0000 mg | ORAL_TABLET | Freq: Two times a day (BID) | ORAL | 0 refills | Status: DC | PRN

## 2017-03-30 MED ORDER — AMOXICILLIN-POT CLAVULANATE 875-125 MG PO TABS: 1.0000 | ORAL_TABLET | Freq: Two times a day (BID) | ORAL | 0 refills | Status: DC

## 2017-03-30 MED ORDER — FLUTICASONE PROPIONATE 50 MCG/ACT NA SUSP: 2.0000 | Freq: Every day | NASAL | 2 refills | Status: DC | PRN

## 2017-03-30 MED ORDER — BENZONATATE 100 MG PO CAPS: 100.0000 mg | ORAL_CAPSULE | Freq: Three times a day (TID) | ORAL | 0 refills | Status: DC | PRN

## 2017-03-30 NOTE — Progress Notes (Signed)
Subjective:  Patient ID: [name], female    DOB: 01/30/1976  Age: 41 y.o. MRN: [id number]  CC: Cough (coughing,sore throat,neck area,ear pain--going on 2 days. )   URI   This is a new problem. The current episode started yesterday. The problem has been unchanged. There has been no fever. Associated symptoms include congestion, coughing, ear pain, a plugged ear sensation, rhinorrhea, sinus pain, a sore throat and swollen glands. Pertinent negatives include no rash or wheezing. [name] has tried nothing for the symptoms.   Congestion for 2 days.  Outpatient Medications Prior to Visit  Medication Sig Dispense Refill  . albuterol (PROVENTIL HFA;VENTOLIN HFA) 108 (90 Base) MCG/ACT inhaler Inhale 2 puffs into the lungs every 6 (six) hours as needed for wheezing or shortness of breath. 1 Inhaler 0  . Armodafinil (NUVIGIL) 250 MG tablet Take 250 mg by mouth daily.    . B-D ULTRAFINE III SHORT PEN 31G X 8 MM MISC USE TO TEST BLOOD SUGARS 1-2 TIMES A DAY AS NEEDED. 100 each 3  . BIOTIN PO Take 500 mcg by mouth daily.     . budesonide-formoterol (SYMBICORT) 160-4.5 MCG/ACT inhaler Inhale 2 puffs into the lungs 2 (two) times daily.    . carvedilol (COREG) 25 MG tablet Take 1 tablet (25 mg total) by mouth 2 (two) times daily with a meal. 180 tablet 3  . colestipol (COLESTID) 1 g tablet Take 2 tablets (2 g total) by mouth daily. 60 tablet 3  . cyclobenzaprine (FLEXERIL) 10 MG tablet Take 1 tablet (10 mg total) by mouth 3 (three) times daily as needed for muscle spasms. 30 tablet 0  . Diclofenac Sodium 2 % SOLN Apply 1 Pump topically 2 (two) times daily as needed (right shoulder and neck pain).     Marland Kitchen dicyclomine (BENTYL) 20 MG tablet Take 20 mg by mouth 2 (two) times daily as needed for spasms.    Marland Kitchen gabapentin (NEURONTIN) 100 MG capsule Take 200 mg by mouth at bedtime as needed (pain).     . Insulin Pen Needle (NOVOFINE) 32G X 6 MM MISC Use to administer victoza every day 100 each 1  . ivabradine  (CORLANOR) 5 MG TABS tablet Take 1.5 tablets (7.5 mg total) by mouth 2 (two) times daily with a meal. 90 tablet 3  . magnesium oxide (MAG-OX) 400 MG tablet Take 1 tablet (400 mg total) by mouth daily. 90 tablet 3  . ondansetron (ZOFRAN ODT) 4 MG disintegrating tablet Take 1 tablet (4 mg total) by mouth every 8 (eight) hours as needed for nausea or vomiting. 20 tablet 0  . potassium chloride SA (K-DUR,KLOR-CON) 20 MEQ tablet Take 2 tablets (40 mEq total) by mouth daily. 30 tablet 3  . PRESCRIPTION MEDICATION Inhale into the lungs at bedtime. CPAP    . sacubitril-valsartan (ENTRESTO) 97-103 MG Take 1 tablet by mouth 2 (two) times daily. 60 tablet 11  . spironolactone (ALDACTONE) 25 MG tablet TAKE 1 TABLET BY MOUTH EVERY DAY (Patient taking differently: TAKE 1 TABLET (25 MG) BY MOUTH EVERY DAY) 30 tablet 8  . Turmeric 500 MG CAPS Take 500 mg by mouth daily.    Marland Kitchen venlafaxine XR (EFFEXOR-XR) 75 MG 24 hr capsule Take 225 mg by mouth daily with breakfast.    . VICTOZA 18 MG/3ML SOPN Inject 0.3 mLs (1.8 mg total) into the skin daily. 27 mL 0  . vitamin B-12 (CYANOCOBALAMIN) 500 MCG tablet Take 500 mcg by mouth daily.    . Vitamin  D, Ergocalciferol, (DRISDOL) 50000 units CAPS capsule TAKE 1 CAPSULE (50,000 UNITS TOTAL) BY MOUTH EVERY 7 (SEVEN) DAYS. 12 capsule 0  . fluticasone (FLONASE) 50 MCG/ACT nasal spray PLACE 2 SPRAYS INTO BOTH NOSTRILS DAILY AS NEEDED FOR ALLERGIES. 16 g 2  . torsemide (DEMADEX) 20 MG tablet Take 2 tablets (40 mg total) by mouth 2 (two) times daily. (Patient taking differently: Take 40 mg by mouth See admin instructions. Take 2 tablets (40 mg) by mouth twice daily, take an extra tablet (20 mg) daily as needed for weight gain of >3-4 lbs in 24 hours) 360 tablet 3  . gentamicin ointment (GARAMYCIN) 0.1 % Apply 1 application topically 3 (three) times daily. (Patient not taking: Reported on 01/17/2017) 30 g 0  . predniSONE (DELTASONE) 20 MG tablet 3 tabs po day one, then 2 tabs daily x 4  days (Patient not taking: Reported on 03/30/2017) 11 tablet 0  . trimethoprim-polymyxin b (POLYTRIM) ophthalmic solution Place 1 drop into the left eye every 4 (four) hours. (Patient not taking: Reported on 01/17/2017) 10 mL 0   No facility-administered medications prior to visit.     ROS See HPI  Objective:  BP (!) 140/102   Pulse 100   Temp 98.3 F (36.8 C)   Ht 5\' 5"  (1.651 m)   Wt 238 lb (108 kg)   SpO2 98%   BMI 39.61 kg/m   BP Readings from Last 3 Encounters:  03/30/17 (!) 140/102  01/17/17 (!) 160/79  01/07/17 (!) 144/92    Wt Readings from Last 3 Encounters:  03/30/17 238 lb (108 kg)  01/17/17 238 lb 8 oz (108.2 kg)  01/07/17 232 lb (105.2 kg)    Physical Exam  Constitutional: [name] is oriented to person, place, and time.  HENT:  Right Ear: Tympanic membrane, external ear and ear canal normal.  Left Ear: Tympanic membrane, external ear and ear canal normal.  Nose: Mucosal edema and rhinorrhea present. Right sinus exhibits no maxillary sinus tenderness and no frontal sinus tenderness. Left sinus exhibits no maxillary sinus tenderness and no frontal sinus tenderness.  Mouth/Throat: Uvula is midline. No trismus in the jaw. Posterior oropharyngeal erythema present. No oropharyngeal exudate.  Eyes: No scleral icterus.  Neck: Normal range of motion. Neck supple.  Cardiovascular: Normal rate and normal heart sounds.  Pulmonary/Chest: Effort normal and breath sounds normal.  Musculoskeletal: [name] exhibits no edema.  Lymphadenopathy:    [name] has no cervical adenopathy.  Neurological: [name] is alert and oriented to person, place, and time.  Vitals reviewed.   Lab Results  Component Value Date   WBC 6.0 01/17/2017   HGB 11.8 (L) 01/17/2017   HCT 36.8 01/17/2017   PLT 223 01/17/2017   GLUCOSE 102 (H) 02/03/2017   CHOL 160 11/09/2015   TRIG 70 11/09/2015   HDL 58 11/09/2015   LDLCALC 88 11/09/2015   ALT 8 05/28/2016   AST 10 05/28/2016   NA 137 02/03/2017   K 3.3  (L) 02/03/2017   CL 106 02/03/2017   CREATININE 0.84 02/03/2017   BUN 5 (L) 02/03/2017   CO2 25 02/03/2017   TSH 1.09 05/28/2016   INR 1.0 07/30/2016   HGBA1C 5.3 05/28/2016    No results found.  Assessment & Plan:   Miosha was seen today for cough.  Diagnoses and all orders for this visit:  Acute pharyngitis, unspecified etiology -     POCT rapid strep A -     fluticasone (FLONASE) 50 MCG/ACT nasal spray; Place  2 sprays daily as needed into both nostrils for allergies. -     guaiFENesin (MUCINEX) 600 MG 12 hr tablet; Take 1 tablet (600 mg total) 2 (two) times daily as needed by mouth for cough or to loosen phlegm. -     benzonatate (TESSALON) 100 MG capsule; Take 1 capsule (100 mg total) 3 (three) times daily as needed by mouth for cough. -     amoxicillin-clavulanate (AUGMENTIN) 875-125 MG tablet; Take 1 tablet 2 (two) times daily by mouth.  Acute nasopharyngitis -     fluticasone (FLONASE) 50 MCG/ACT nasal spray; Place 2 sprays daily as needed into both nostrils for allergies. -     guaiFENesin (MUCINEX) 600 MG 12 hr tablet; Take 1 tablet (600 mg total) 2 (two) times daily as needed by mouth for cough or to loosen phlegm. -     benzonatate (TESSALON) 100 MG capsule; Take 1 capsule (100 mg total) 3 (three) times daily as needed by mouth for cough. -     amoxicillin-clavulanate (AUGMENTIN) 875-125 MG tablet; Take 1 tablet 2 (two) times daily by mouth.   I have discontinued Mildred Tuccillo. Quint's gentamicin ointment, trimethoprim-polymyxin b, and predniSONE. I have also changed [name] fluticasone. Additionally, I am having [name] start on guaiFENesin, benzonatate, and amoxicillin-clavulanate. Lastly, I am having [name] maintain [name] gabapentin, venlafaxine XR, NUVIGIL, carvedilol, sacubitril-valsartan, colestipol, ondansetron, vitamin B-12, ivabradine, albuterol, Insulin Pen Needle, spironolactone, budesonide-formoterol, Turmeric, dicyclomine, Diclofenac Sodium, magnesium oxide, BIOTIN PO,  torsemide, B-D ULTRAFINE III SHORT PEN, cyclobenzaprine, VICTOZA, PRESCRIPTION MEDICATION, potassium chloride SA, and Vitamin D (Ergocalciferol).  Meds ordered this encounter  Medications  . fluticasone (FLONASE) 50 MCG/ACT nasal spray    Sig: Place 2 sprays daily as needed into both nostrils for allergies.    Dispense:  16 g    Refill:  2    Order Specific Question:   Supervising Provider    Answer:   Cassandria Anger [[id number]]  . guaiFENesin (MUCINEX) 600 MG 12 hr tablet    Sig: Take 1 tablet (600 mg total) 2 (two) times daily as needed by mouth for cough or to loosen phlegm.    Dispense:  14 tablet    Refill:  0    Order Specific Question:   Supervising Provider    Answer:   Cassandria Anger [[id number]]  . benzonatate (TESSALON) 100 MG capsule    Sig: Take 1 capsule (100 mg total) 3 (three) times daily as needed by mouth for cough.    Dispense:  20 capsule    Refill:  0    Order Specific Question:   Supervising Provider    Answer:   Cassandria Anger [[id number]]  . amoxicillin-clavulanate (AUGMENTIN) 875-125 MG tablet    Sig: Take 1 tablet 2 (two) times daily by mouth.    Dispense:  14 tablet    Refill:  0    Do not fill before 04/02/2017    Order Specific Question:   Supervising Provider    Answer:   Cassandria Anger [[id number]]    Follow-up: No Follow-up on file.  Wilfred Lacy, NP

## 2017-03-30 NOTE — Telephone Encounter (Signed)
Patient is requesting to transfer from [facility] to [place].  States Dr. Tamala Julian recommended Dr. Quay Burow.  Please advise.

## 2017-03-30 NOTE — Patient Instructions (Addendum)
Start augmentin if no improvement by 04/02/2017.  Upper Respiratory Infection, Adult Most upper respiratory infections (URIs) are caused by a virus. A URI affects the nose, throat, and upper air passages. The most common type of URI is often called "the common cold." Follow these instructions at home:  Take medicines only as told by your doctor.  Gargle warm saltwater or take cough drops to comfort your throat as told by your doctor.  Use a warm mist humidifier or inhale steam from a shower to increase air moisture. This may make it easier to breathe.  Drink enough fluid to keep your pee (urine) clear or pale yellow.  Eat soups and other clear broths.  Have a healthy diet.  Rest as needed.  Go back to work when your fever is gone or your doctor says it is okay. ? You may need to stay home longer to avoid giving your URI to others. ? You can also wear a face mask and wash your hands often to prevent spread of the virus.  Use your inhaler more if you have asthma.  Do not use any tobacco products, including cigarettes, chewing tobacco, or electronic cigarettes. If you need help quitting, ask your doctor. Contact a doctor if:  You are getting worse, not better.  Your symptoms are not helped by medicine.  You have chills.  You are getting more short of breath.  You have brown or red mucus.  You have yellow or brown discharge from your nose.  You have pain in your face, especially when you bend forward.  You have a fever.  You have puffy (swollen) neck glands.  You have pain while swallowing.  You have white areas in the back of your throat. Get help right away if:  You have very bad or constant: ? Headache. ? Ear pain. ? Pain in your forehead, behind your eyes, and over your cheekbones (sinus pain). ? Chest pain.  You have long-lasting (chronic) lung disease and any of the following: ? Wheezing. ? Long-lasting cough. ? Coughing up blood. ? A change in your  usual mucus.  You have a stiff neck.  You have changes in your: ? Vision. ? Hearing. ? Thinking. ? Mood. This information is not intended to replace advice given to you by your health care provider. Make sure you discuss any questions you have with your health care provider. Document Released: 10/28/2007 Document Revised: 01/12/2016 Document Reviewed: 08/16/2013 Elsevier Interactive Patient Education  2018 [employer].

## 2017-03-31 NOTE — Telephone Encounter (Signed)
Let her know I can not accept her at this time.  I would recommend [facility].

## 2017-04-01 NOTE — Telephone Encounter (Signed)
Left patient vm to call back to schedule appt with [facility] or Dr. Jenny Reichmann.

## 2017-04-04 ENCOUNTER — Encounter: Payer: Self-pay | Admitting: Nurse Practitioner

## 2017-04-05 ENCOUNTER — Other Ambulatory Visit: Payer: Self-pay | Admitting: Nurse Practitioner

## 2017-04-05 DIAGNOSIS — J209 Acute bronchitis, unspecified: Secondary | ICD-10-CM

## 2017-04-05 MED ORDER — ALBUTEROL SULFATE HFA 108 (90 BASE) MCG/ACT IN AERS: 1.0000 | INHALATION_SPRAY | Freq: Four times a day (QID) | RESPIRATORY_TRACT | 0 refills | Status: DC | PRN

## 2017-04-05 MED ORDER — PROMETHAZINE-DM 6.25-15 MG/5ML PO SYRP: 5.0000 mL | ORAL_SOLUTION | Freq: Three times a day (TID) | ORAL | 0 refills | Status: DC | PRN

## 2017-04-06 ENCOUNTER — Telehealth: Payer: Self-pay

## 2017-04-06 NOTE — Telephone Encounter (Signed)
Attempted ICM call to patient to follow up on Heartlogic device alert of 39 and to evaluate if patient is having any fluid symptoms.   No answer and unable to reach patient.

## 2017-04-12 ENCOUNTER — Encounter (HOSPITAL_COMMUNITY): Payer: Self-pay | Admitting: Internal Medicine

## 2017-04-12 ENCOUNTER — Telehealth: Payer: Self-pay | Admitting: Cardiology

## 2017-04-12 ENCOUNTER — Ambulatory Visit (INDEPENDENT_AMBULATORY_CARE_PROVIDER_SITE_OTHER): Payer: BLUE CROSS/BLUE SHIELD

## 2017-04-12 DIAGNOSIS — Z95810 Presence of automatic (implantable) cardiac defibrillator: Secondary | ICD-10-CM

## 2017-04-12 DIAGNOSIS — I5042 Chronic combined systolic (congestive) and diastolic (congestive) heart failure: Secondary | ICD-10-CM

## 2017-04-12 NOTE — Telephone Encounter (Signed)
Informed pt that she would need be around her monitor in order to send a transmission, informed pt that she could go to the ED tonight and they would check her device or do a manual transmission tonight and it would be reviewed in the morning.  Pt stated that she would do a manual transmission.

## 2017-04-12 NOTE — Telephone Encounter (Signed)
Patient called and stated that she suddenly felt bad. She felt dizzy, vertigo symptoms, and really weak. Pt stated that she is not home near her home monitor. Call forwarded to [place].

## 2017-04-13 ENCOUNTER — Encounter: Payer: Self-pay | Admitting: Family Medicine

## 2017-04-13 ENCOUNTER — Other Ambulatory Visit (HOSPITAL_COMMUNITY): Payer: Self-pay | Admitting: *Deleted

## 2017-04-13 ENCOUNTER — Telehealth: Payer: Self-pay

## 2017-04-13 DIAGNOSIS — I10 Essential (primary) hypertension: Secondary | ICD-10-CM

## 2017-04-13 MED ORDER — BUDESONIDE-FORMOTEROL FUMARATE 160-4.5 MCG/ACT IN AERO: 2.0000 | INHALATION_SPRAY | Freq: Two times a day (BID) | RESPIRATORY_TRACT | 3 refills | Status: DC

## 2017-04-13 MED ORDER — SACUBITRIL-VALSARTAN 97-103 MG PO TABS: 1.0000 | ORAL_TABLET | Freq: Two times a day (BID) | ORAL | 3 refills | Status: DC

## 2017-04-13 MED ORDER — CARVEDILOL 25 MG PO TABS: 25.0000 mg | ORAL_TABLET | Freq: Two times a day (BID) | ORAL | 3 refills | Status: DC

## 2017-04-13 NOTE — Progress Notes (Addendum)
Patient returned call.  She had a cold which resolved around 11/13 but since that time she continues to have a cough especially at night and says it is different than cough when she had a cold, sitting up at night to breath better, shortness of breath during normal activities and during stair climbing and feeling weak.  She has some nausea and sweating that comes and goes.  She took extra Torsemide 2 days ago and weight at that time was 236 lbs and after taking extra fluid pill it was 228 lbs yesterday which is around her baseline.  She asked if Dr Caryl Comes could send a script for symbicort which has helped some.  Her PCP has moved and she is currently without a PCP and she does not have a pulmonologist.     Advised to take extra Torsemide as prescribed which would be 60 mg in AM and 40 mg in PM x 3 days.  Also increase Potassium 20 mEq to 2 tablets twice a day x 3 days.  After 3rd day, return to prescribed Torsemide dosage of 40 mg twice a day and Potassium 40 mEq daily.  She verbalized understanding.  Advised will review with Dr Caryl Comes for any further recommendations.  Advised that she go to ER if symptoms become urgent today or throughout the holiday.

## 2017-04-13 NOTE — Progress Notes (Addendum)
EPIC Encounter for ICM Monitoring  Patient Name: [name] is a 41 y.o. female Date: 04/13/2017 Primary Care Physican: [facility], [place] Primary Cardiologist: Smith/Bensimhon Electrophysiologist: Faustino Congress Weight:Previous weight234lbs        Attempted call to patient and unable to reach.  Left message to return call.  Transmission reviewed.    Heartlogic HF Index: 54 - 04/11/2017  Prescribed dosage: Torsemide 20 mg take 2 tablets (40 mg) by mouth twice daily, take an extra tablet (20 mg) daily as needed for weight gain of >3-4 lbs in 24 hours Potassium 20 mEq 2tablets (40 mEq total) daily.  Labs: 02/03/2017 Creatinine 0.84, BUN 5,   Potassium 3.3, Sodium 135, EGFR >60 01/17/2017 Creatinine 0.76, BUN >5, Potassium 3.2, Sodium 135, EGFR >60 01/07/2017 Creatinine 0.86, BUN 8,   Potassium 3.4, Sodium 139, EGFR >60 10/07/2016 Creatinine 0.88, BUN 7, Potassium 4.1, Sodium 137, EGFR >60 07/30/2016 Creatinine 0.95, BUN 13, Potassium 3.8, Sodium 138, EGFR 74-85  06/24/2016 Creatinine 0.93, BUN 8, Potassium 3.5, Sodium 138, EGFR >60  06/22/2016 Creatinine 0.77, BUN 6, Potassium 3.9, Sodium 141, EGFR 96-110  05/28/2016 Creatinine 0.81, BUN 7, Potassium 3.4, Sodium 139  Recommendations: NONE - Unable to reach.  Follow-up plan: ICM clinic phone appointment on 04/22/2017 to recheck fluid levels.  Office appointment scheduled 05/10/2017 with Dr. Haroldine Laws.  Copy of ICM check sent to Dr Caryl Comes and Dr Haroldine Laws.             8 Day Trend Data    3 Month Trend

## 2017-04-13 NOTE — Progress Notes (Signed)
Call back to patient and advised Dr Caryl Comes agreed with increasing Torsemide and Potassium as discussed but he will not refill her Symbicort since it is not a medication he usually prescribes.  Advised she call the PCP office she has been going to and there should be a covering physician that can refill the Symbicort.  She said she will call the NP at that office.  Advised if she is not feeling better on Monday to call back.  Will recheck fluid levels on 04/22/2017

## 2017-04-13 NOTE — Telephone Encounter (Signed)
Remote ICM transmission received.  Attempted call to patient and left message to return call. 

## 2017-04-22 ENCOUNTER — Ambulatory Visit (INDEPENDENT_AMBULATORY_CARE_PROVIDER_SITE_OTHER): Payer: Self-pay

## 2017-04-22 DIAGNOSIS — I5042 Chronic combined systolic (congestive) and diastolic (congestive) heart failure: Secondary | ICD-10-CM

## 2017-04-22 DIAGNOSIS — Z95810 Presence of automatic (implantable) cardiac defibrillator: Secondary | ICD-10-CM

## 2017-04-23 ENCOUNTER — Encounter: Payer: Self-pay | Admitting: Family Medicine

## 2017-04-25 ENCOUNTER — Encounter: Payer: Self-pay | Admitting: Family Medicine

## 2017-04-25 ENCOUNTER — Other Ambulatory Visit: Payer: Self-pay | Admitting: Nurse Practitioner

## 2017-04-25 DIAGNOSIS — J209 Acute bronchitis, unspecified: Secondary | ICD-10-CM

## 2017-04-26 MED ORDER — IBUPROFEN-FAMOTIDINE 800-26.6 MG PO TABS: 1.0000 | ORAL_TABLET | Freq: Three times a day (TID) | ORAL | 3 refills | Status: DC | PRN

## 2017-04-26 MED ORDER — ALBUTEROL SULFATE HFA 108 (90 BASE) MCG/ACT IN AERS: 1.0000 | INHALATION_SPRAY | Freq: Four times a day (QID) | RESPIRATORY_TRACT | 0 refills | Status: DC | PRN

## 2017-04-26 NOTE — Progress Notes (Signed)
EPIC Encounter for ICM Monitoring  Patient Name: [name] is a 41 y.o. female Date: 04/26/2017 Primary Care Physican: Golden Circle, [place] Primary Cardiologist: Smith/Bensimhon Electrophysiologist: Caryl Comes Dry Weight:226lbs       Heart Failure questions reviewed, pt reported after taking extra Torsemide on 04/12/2017 she felt really dried out but today she c/o tiredness, cough and congested.  She is unsure if she is getting a cold or if it fluid.    04/21/2017 Heartlogic HF Index: 58    Prescribed dosage: Torsemide 20 mg take 2 tablets (40 mg) by mouth twice daily, take an extra tablet (20 mg) daily as needed for weight gain of >3-4 lbs in 24 hours Potassium 20 mEq 2tablets (40 mEq total) daily.  Labs: 02/03/2017 Creatinine 0.84, BUN 5,   Potassium 3.3, Sodium 135, EGFR >60 01/17/2017 Creatinine 0.76, BUN >5, Potassium 3.2, Sodium 135, EGFR >60 01/07/2017 Creatinine 0.86, BUN 8,   Potassium 3.4, Sodium 139, EGFR >60 10/07/2016 Creatinine 0.88, BUN 7, Potassium 4.1, Sodium 137, EGFR >60 07/30/2016 Creatinine 0.95, BUN 13, Potassium 3.8, Sodium 138, EGFR 74-85  06/24/2016 Creatinine 0.93, BUN 8, Potassium 3.5, Sodium 138, EGFR >60  06/22/2016 Creatinine 0.77, BUN 6, Potassium 3.9, Sodium 141, EGFR 96-110  05/28/2016 Creatinine 0.81, BUN 7, Potassium 3.4, Sodium 139  Recommendations: Advised that she has extra Torsemide prescribed if needed and if she takes extra Torsemide to take 1 extra potassium pill with it.  She said if she gets worse than she will do that.    Follow-up plan: ICM clinic phone appointment on 05/24/2017.  Office appointment scheduled 05/10/2017 with Dr. Haroldine Laws.        Copy of ICM check sent to Dr. Caryl Comes and Dr. Haroldine Laws for recommendations if needed.  3 Month Trend                      8 Day Trend Data

## 2017-04-27 ENCOUNTER — Other Ambulatory Visit (HOSPITAL_COMMUNITY): Payer: Self-pay | Admitting: *Deleted

## 2017-04-27 MED ORDER — POTASSIUM CHLORIDE CRYS ER 20 MEQ PO TBCR: 40.0000 meq | EXTENDED_RELEASE_TABLET | Freq: Every day | ORAL | 3 refills | Status: DC

## 2017-04-28 ENCOUNTER — Encounter (HOSPITAL_COMMUNITY): Payer: Self-pay | Admitting: *Deleted

## 2017-04-28 ENCOUNTER — Other Ambulatory Visit: Payer: Self-pay

## 2017-04-28 ENCOUNTER — Emergency Department (HOSPITAL_COMMUNITY): Payer: BLUE CROSS/BLUE SHIELD

## 2017-04-28 ENCOUNTER — Observation Stay (HOSPITAL_COMMUNITY)
Admission: EM | Admit: 2017-04-28 | Discharge: 2017-04-30 | Disposition: A | Discharge status: Home / Self Care | Payer: BLUE CROSS/BLUE SHIELD | Attending: Cardiovascular Disease | Admitting: Cardiovascular Disease

## 2017-04-28 DIAGNOSIS — Z794 Long term (current) use of insulin: Secondary | ICD-10-CM | POA: Insufficient documentation

## 2017-04-28 DIAGNOSIS — I428 Other cardiomyopathies: Secondary | ICD-10-CM | POA: Diagnosis not present

## 2017-04-28 DIAGNOSIS — I509 Heart failure, unspecified: Secondary | ICD-10-CM | POA: Diagnosis not present

## 2017-04-28 DIAGNOSIS — I5023 Acute on chronic systolic (congestive) heart failure: Secondary | ICD-10-CM | POA: Diagnosis not present

## 2017-04-28 DIAGNOSIS — Z8679 Personal history of other diseases of the circulatory system: Secondary | ICD-10-CM

## 2017-04-28 DIAGNOSIS — F329 Major depressive disorder, single episode, unspecified: Secondary | ICD-10-CM | POA: Insufficient documentation

## 2017-04-28 DIAGNOSIS — Z7951 Long term (current) use of inhaled steroids: Secondary | ICD-10-CM | POA: Insufficient documentation

## 2017-04-28 DIAGNOSIS — E119 Type 2 diabetes mellitus without complications: Secondary | ICD-10-CM | POA: Insufficient documentation

## 2017-04-28 DIAGNOSIS — G4733 Obstructive sleep apnea (adult) (pediatric): Secondary | ICD-10-CM | POA: Diagnosis not present

## 2017-04-28 DIAGNOSIS — Z79899 Other long term (current) drug therapy: Secondary | ICD-10-CM | POA: Insufficient documentation

## 2017-04-28 DIAGNOSIS — Z95810 Presence of automatic (implantable) cardiac defibrillator: Secondary | ICD-10-CM | POA: Diagnosis not present

## 2017-04-28 DIAGNOSIS — R0602 Shortness of breath: Secondary | ICD-10-CM

## 2017-04-28 DIAGNOSIS — I110 Hypertensive heart disease with heart failure: Principal | ICD-10-CM | POA: Insufficient documentation

## 2017-04-28 DIAGNOSIS — R079 Chest pain, unspecified: Secondary | ICD-10-CM | POA: Diagnosis not present

## 2017-04-28 DIAGNOSIS — J45909 Unspecified asthma, uncomplicated: Secondary | ICD-10-CM | POA: Insufficient documentation

## 2017-04-28 DIAGNOSIS — F419 Anxiety disorder, unspecified: Secondary | ICD-10-CM | POA: Diagnosis not present

## 2017-04-28 HISTORY — DX: Chest pain, unspecified: R07.9

## 2017-04-28 LAB — CBC
HCT: 39.6 % (ref 36.0–46.0)
Hemoglobin: 12.9 g/dL (ref 12.0–15.0)
MCH: 28.9 pg (ref 26.0–34.0)
MCHC: 32.6 g/dL (ref 30.0–36.0)
MCV: 88.6 fL (ref 78.0–100.0)
Platelets: 247 10*3/uL (ref 150–400)
RBC: 4.47 MIL/uL (ref 3.87–5.11)
RDW: 13.6 % (ref 11.5–15.5)
WBC: 6.7 10*3/uL (ref 4.0–10.5)

## 2017-04-28 LAB — BASIC METABOLIC PANEL
Anion gap: 8 (ref 5–15)
BUN: <5 mg/dL — ABNORMAL LOW (ref 6–20)
CO2: 25 mmol/L (ref 22–32)
Calcium: 9 mg/dL (ref 8.9–10.3)
Chloride: 104 mmol/L (ref 101–111)
Creatinine, Ser: 0.78 mg/dL (ref 0.44–1.00)
GFR calc Af Amer: >60 mL/min (ref >60)
GFR calc non Af Amer: >60 mL/min (ref >60)
Glucose, Bld: 100 mg/dL — ABNORMAL HIGH (ref 65–99)
Potassium: 4.1 mmol/L (ref 3.5–5.1)
Sodium: 137 mmol/L (ref 135–145)

## 2017-04-28 LAB — I-STAT TROPONIN, ED: Troponin i, poc: 0 ng/mL (ref 0.00–0.08)

## 2017-04-28 LAB — I-STAT BETA HCG BLOOD, ED (MC, WL, AP ONLY): I-stat hCG, quantitative: <5 m[IU]/mL (ref <5)

## 2017-04-28 LAB — BRAIN NATRIURETIC PEPTIDE: B Natriuretic Peptide: 478 pg/mL — ABNORMAL HIGH (ref 0.0–100.0)

## 2017-04-28 MED ORDER — VITAMIN B-12 1000 MCG PO TABS
500.0000 ug | ORAL_TABLET | Freq: Every day | ORAL | Status: DC
  Administered 2017-04-29 – 2017-04-30 (×2): 500 ug via ORAL
  Filled 2017-04-28 (×3): qty 1

## 2017-04-28 MED ORDER — DICLOFENAC SODIUM 1 % TD GEL: 1.0000 "application " | Freq: Two times a day (BID) | TRANSDERMAL | Status: DC | PRN

## 2017-04-28 MED ORDER — VENLAFAXINE HCL ER 75 MG PO CP24
225.0000 mg | ORAL_CAPSULE | Freq: Every day | ORAL | Status: DC
  Administered 2017-04-29 – 2017-04-30 (×2): 225 mg via ORAL
  Filled 2017-04-28 (×2): qty 1

## 2017-04-28 MED ORDER — IVABRADINE HCL 7.5 MG PO TABS
7.5000 mg | ORAL_TABLET | Freq: Two times a day (BID) | ORAL | Status: DC
  Administered 2017-04-29 – 2017-04-30 (×3): 7.5 mg via ORAL
  Filled 2017-04-28 (×4): qty 1

## 2017-04-28 MED ORDER — MODAFINIL 100 MG PO TABS: 200.0000 mg | ORAL_TABLET | Freq: Every day | ORAL | Status: DC

## 2017-04-28 MED ORDER — DICYCLOMINE HCL 20 MG PO TABS: 20.0000 mg | ORAL_TABLET | Freq: Two times a day (BID) | ORAL | Status: DC | PRN

## 2017-04-28 MED ORDER — SPIRONOLACTONE 25 MG PO TABS
25.0000 mg | ORAL_TABLET | Freq: Every day | ORAL | Status: DC
  Administered 2017-04-29 – 2017-04-30 (×2): 25 mg via ORAL
  Filled 2017-04-28 (×3): qty 1

## 2017-04-28 MED ORDER — ALBUTEROL SULFATE (2.5 MG/3ML) 0.083% IN NEBU: 2.5000 mg | INHALATION_SOLUTION | Freq: Four times a day (QID) | RESPIRATORY_TRACT | Status: DC | PRN

## 2017-04-28 MED ORDER — IBUPROFEN 400 MG PO TABS
400.0000 mg | ORAL_TABLET | Freq: Once | ORAL | Status: AC | PRN
  Administered 2017-04-28: 400 mg via ORAL
  Filled 2017-04-28: qty 1

## 2017-04-28 MED ORDER — ACETAMINOPHEN 325 MG PO TABS
650.0000 mg | ORAL_TABLET | ORAL | Status: DC | PRN
  Administered 2017-04-29 – 2017-04-30 (×5): 650 mg via ORAL
  Filled 2017-04-28 (×8): qty 2

## 2017-04-28 MED ORDER — COLESTIPOL HCL 1 G PO TABS
1.0000 g | ORAL_TABLET | Freq: Two times a day (BID) | ORAL | Status: DC
  Administered 2017-04-29 – 2017-04-30 (×3): 1 g via ORAL
  Filled 2017-04-28 (×5): qty 1

## 2017-04-28 MED ORDER — CARVEDILOL 25 MG PO TABS
25.0000 mg | ORAL_TABLET | Freq: Two times a day (BID) | ORAL | Status: DC
  Filled 2017-04-28 (×3): qty 1

## 2017-04-28 MED ORDER — FLUTICASONE PROPIONATE 50 MCG/ACT NA SUSP: 2.0000 | Freq: Every day | NASAL | Status: DC | PRN

## 2017-04-28 MED ORDER — ONDANSETRON HCL 4 MG/2ML IJ SOLN
4.0000 mg | Freq: Four times a day (QID) | INTRAMUSCULAR | Status: DC | PRN
  Administered 2017-04-29: 4 mg via INTRAVENOUS
  Filled 2017-04-28 (×2): qty 2

## 2017-04-28 MED ORDER — GABAPENTIN 100 MG PO CAPS: 200.0000 mg | ORAL_CAPSULE | Freq: Every evening | ORAL | Status: DC | PRN

## 2017-04-28 MED ORDER — FUROSEMIDE 10 MG/ML IJ SOLN
80.0000 mg | Freq: Once | INTRAMUSCULAR | Status: AC
  Administered 2017-04-29: 80 mg via INTRAVENOUS
  Filled 2017-04-28: qty 8

## 2017-04-28 MED ORDER — SACUBITRIL-VALSARTAN 97-103 MG PO TABS
1.0000 | ORAL_TABLET | Freq: Two times a day (BID) | ORAL | Status: DC
  Administered 2017-04-29 – 2017-04-30 (×4): 1 via ORAL
  Filled 2017-04-28 (×5): qty 1

## 2017-04-28 MED ORDER — FLUTICASONE FUROATE-VILANTEROL 200-25 MCG/INH IN AEPB
1.0000 | INHALATION_SPRAY | Freq: Every day | RESPIRATORY_TRACT | Status: DC
  Administered 2017-04-29 – 2017-04-30 (×2): 1 via RESPIRATORY_TRACT
  Filled 2017-04-28: qty 28

## 2017-04-28 MED ORDER — BIOTIN 5000 MCG PO TABS: 5000.0000 ug | ORAL_TABLET | Freq: Every day | ORAL | Status: DC

## 2017-04-28 NOTE — ED Notes (Signed)
bnp ordered by pa

## 2017-04-28 NOTE — ED Notes (Addendum)
Pt presents with increased SOB and chest pressure for several days. Pt also contacted her cardiologist who advised her to increase her tersomide.

## 2017-04-28 NOTE — ED Provider Notes (Signed)
[facility] EMERGENCY DEPARTMENT Provider Note   CSN: [id number] Arrival date & time: 04/28/17  1726     History   Chief Complaint Chief Complaint  Patient presents with  . Shortness of Breath  . Chest Pain    HPI [name] is a 41 y.o. female.  HPI Patient has history significant for severe cardiomyopathy with AICD in place. She has had congestive heart failure. Patient reports that throughout the month of November she seemed to have a respiratory illness. She kept saying he she will not resolving a cold but then by the end of the month it became apparent that she was having congestive heart failure. Patient reports her torsemide dose has been increased her cardiac provider. She is taking torsemide 60 mg in the morning and 40 mg in the evening. She reports for the past 2 days she has been experiencing increasing chest pressure in her left upper chest and shortness of breath. Despite increasing medications she feels her symptoms are worsening. She denies increased swelling in her legs. She does report she is about 3 pounds over her recommended weight. Patient denies fever or productive cough. Past Medical History:  Diagnosis Date  . AICD (automatic cardioverter/defibrillator) present   . Anxiety   . Arthritis    right shoulder   . Asthma   . CHF (congestive heart failure) ([place])   . Depression   . Diabetes mellitus without complication ([place])   . Dyspnea    comes and goes intermittently mostly with exertion   . Fibroid    age 6  . Gallstones   . Hypertension   . IBS (irritable bowel syndrome)   . Migraine    "monthly" (08/03/2016)  . OSA on CPAP   . Ovarian cyst    1999; surgically removed  . 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 Seizures ([place])    "as a child" (08/03/2016)  . Termination of pregnancy    due to cardiac risk    Patient Active Problem List   Diagnosis Date Noted  . ICD (implantable  cardioverter-defibrillator), single, 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  . SI (sacroiliac) joint dysfunction 11/16/2016  . Nonallopathic lesion of lumbosacral region 11/16/2016  . Nonallopathic lesion of sacral region 11/16/2016  . Patellofemoral syndrome of both knees 10/16/2016  . Right foot injury, initial encounter 09/02/2016  . NICM (nonischemic cardiomyopathy) ([place]) 08/03/2016  . Premature ventricular contractions 06/23/2016  . Hyperglycemia 05/28/2016  . Generalized abdominal cramping   . Diabetes mellitus without complication ([place])   . Labral tear of shoulder 04/04/2015  . Acute upper respiratory infection 11/06/2014  . 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 Nonallopathic lesion of thoracic region 08/20/2014  . Subacromial bursitis 07/16/2014  . Iliotibial band syndrome of right side 07/16/2014  . Chronic combined systolic and diastolic heart failure ([place]) 03/05/2014  . Closed low lateral malleolus fracture 10/23/2013  . Thoracic back pain 06/20/2013  . IBS (irritable bowel syndrome)   . Depression with anxiety 01/20/2013  . OSA (obstructive sleep apnea) 01/02/2013  . 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 Essential hypertension     Past Surgical History:  Procedure Laterality Date  . CARDIAC CATHETERIZATION N/A 11/11/2015   Procedure: Right/Left Heart Cath and Coronary Angiography;  Surgeon: Troy Sine, MD;  Location: [facility];  Service: Cardiovascular;  Laterality: N/A;  . CARDIAC CATHETERIZATION  ~ 2015  . CARDIAC DEFIBRILLATOR PLACEMENT  08/03/2016  . CESAREAN SECTION  1999  . COLONOSCOPY  WITH PROPOFOL N/A 04/21/2016   Procedure: COLONOSCOPY WITH PROPOFOL;  Surgeon: Jerene Bears, MD;  Location: [facility];  Service: Gastroenterology;  Laterality: N/A;  . ESOPHAGOGASTRODUODENOSCOPY (EGD) WITH PROPOFOL N/A 04/21/2016   Procedure: ESOPHAGOGASTRODUODENOSCOPY (EGD) WITH PROPOFOL;  Surgeon: Jerene Bears, MD;  Location: [facility];  Service: Gastroenterology;  Laterality: N/A;   . FOOT FRACTURE SURGERY Right ~ 2003  . FRACTURE SURGERY    . ICD IMPLANT N/A 08/03/2016   Procedure: ICD Implant;  Surgeon: Deboraha Sprang, MD;  Location: [facility];  Service: Cardiovascular;  Laterality: N/A;  . LAPAROSCOPIC CHOLECYSTECTOMY  12/2006  . LAPAROSCOPY ABDOMEN DIAGNOSTIC  2008   "cut bile duct w/gallbladder OR; had to go in later & fix leak; hospitalized for 2 months"  . LEFT HEART CATHETERIZATION WITH CORONARY ANGIOGRAM N/A 02/26/2014   Procedure: LEFT HEART CATHETERIZATION WITH CORONARY ANGIOGRAM;  Surgeon: Jettie Booze, MD;  Location[facility] C[facility];  Service: Cardiovascular;  Laterality: N/A;  . OVARIAN CYST REMOVAL Right 1999  . TUBAL LIGATION  1999    OB History    Gravida Para Term Preterm AB Living   2 1     1 1    SAB TAB Ectopic Multiple Live Births     1             Home Medications    Prior to Admission medications   Medication Sig Start Date End Date Taking? Authorizing Provider  albuterol (PROVENTIL HFA;VENTOLIN HFA) 108 (90 Base) MCG/ACT inhaler Inhale 1-2 puffs into the lungs every 6 (six) hours as needed for wheezing or shortness of breath. **PT NEEDS TO ESTABLISH WITH ANOTHER PROVIDER FOR ADDITIONAL REFILLS** Patient taking differently: Inhale 1-2 puffs into the lungs every 6 (six) hours as needed for wheezing or shortness of breath.  04/26/17  Yes Biagio Borg, MD  Armodafinil (NUVIGIL) 250 MG tablet Take 250 mg by mouth daily.   Yes [provider]  Biotin 5000 MCG TABS Take 5,000 mcg by mouth daily.   Yes [provider]  budesonide-formoterol (SYMBICORT) 160-4.5 MCG/ACT inhaler Inhale 2 puffs into the lungs 2 (two) times daily. 04/13/17  Yes Hulan Saas M, DO  carvedilol (COREG) 25 MG tablet Take 1 tablet (25 mg total) 2 (two) times daily with a meal by mouth. 04/13/17  Yes Bensimhon, Shaune Pascal, MD  colestipol (COLESTID) 1 g tablet Take 2 tablets (2 g total) by mouth daily. Patient taking differently: Take 1 g by  mouth 2 (two) times daily.  04/27/16  Yes Pyrtle, Lajuan Lines, MD  cyclobenzaprine (FLEXERIL) 10 MG tablet Take 1 tablet (10 mg total) by mouth 3 (three) times daily as needed for muscle spasms. 01/08/17  Yes Golden Circle, FNP  Diclofenac Sodium 2 % SOLN Apply 1 Pump topically 2 (two) times daily as needed (right shoulder and neck pain).    Yes [provider]  dicyclomine (BENTYL) 20 MG tablet Take 20 mg by mouth 2 (two) times daily as needed for spasms.   Yes [provider]  fluticasone (FLONASE) 50 MCG/ACT nasal spray Place 2 sprays daily as needed into both nostrils for allergies. 03/30/17  Yes Nche, Charlene Brooke, NP  gabapentin (NEURONTIN) 100 MG capsule Take 200 mg by mouth at bedtime as needed (for pain).    Yes [provider]  Ibuprofen-Famotidine 800-26.6 MG TABS Take 1 tablet by mouth 3 (three) times daily as needed. Patient taking differently: Take 1 tablet by mouth 3 (three) times daily  as needed (for pain and/or swelling).  04/26/17  Yes Lyndal Pulley, DO  ivabradine (CORLANOR) 5 MG TABS tablet Take 1.5 tablets (7.5 mg total) by mouth 2 (two) times daily with a meal. 07/10/16  Yes Bensimhon, Shaune Pascal, MD  magnesium oxide (MAG-OX) 400 MG tablet Take 1 tablet (400 mg total) by mouth daily. 11/20/16  Yes Deboraha Sprang, MD  ondansetron (ZOFRAN ODT) 4 MG disintegrating tablet Take 1 tablet (4 mg total) by mouth every 8 (eight) hours as needed for nausea or vomiting. 05/11/16  Yes Ward, Delice Bison, DO  potassium chloride SA (K-DUR,KLOR-CON) 20 MEQ tablet Take 2 tablets (40 mEq total) by mouth daily. 04/27/17  Yes Larey Dresser, MD  PRESCRIPTION MEDICATION CPAP: AT BEDTIME   Yes [provider]  sacubitril-valsartan (ENTRESTO) 97-103 MG Take 1 tablet 2 (two) times daily by mouth. 04/13/17  Yes Bensimhon, Shaune Pascal, MD  spironolactone (ALDACTONE) 25 MG tablet TAKE 1 TABLET BY MOUTH EVERY DAY Patient taking differently: Take 25 mg by mouth once a day 11/20/16   Yes Lelon Perla, MD  torsemide (DEMADEX) 20 MG tablet Take 2 tablets (40 mg total) by mouth 2 (two) times daily. Patient taking differently: Take 40 mg by mouth 2 (two) times daily. AND MAY TAKE AN ADDITIONAL DOSE OF 20 MG ONCE A DAY AS NEEDED FOR A WEIGHT GAIN OF >3-4 POUNDS IN 24 HRS 12/15/16 04/28/17 Yes Belva Crome, MD  venlafaxine XR (EFFEXOR-XR) 75 MG 24 hr capsule Take 225 mg by mouth daily with breakfast.   Yes [provider]  VICTOZA 18 MG/3ML SOPN Inject 0.3 mLs (1.8 mg total) into the skin daily. 01/08/17  Yes Golden Circle, FNP  vitamin B-12 (CYANOCOBALAMIN) 500 MCG tablet Take 500 mcg by mouth daily.   Yes [provider]  Vitamin D, Ergocalciferol, (DRISDOL) 50000 units CAPS capsule TAKE 1 CAPSULE (50,000 UNITS TOTAL) BY MOUTH EVERY 7 (SEVEN) DAYS. Patient taking differently: Take 50,000 Units by mouth every Wednesday.  03/22/17  Yes Lyndal Pulley, DO  amoxicillin-clavulanate (AUGMENTIN) 875-125 MG tablet Take 1 tablet 2 (two) times daily by mouth. Patient not taking: Reported on 04/28/2017 04/02/17   Nche, Charlene Brooke, NP  B-D ULTRAFINE III SHORT PEN 31G X 8 MM MISC USE TO TEST BLOOD SUGARS 1-2 TIMES A DAY AS NEEDED. 12/21/16   Golden Circle, FNP  benzonatate (TESSALON) 100 MG capsule Take 1 capsule (100 mg total) 3 (three) times daily as needed by mouth for cough. Patient not taking: Reported on 04/28/2017 03/30/17   Nche, Charlene Brooke, NP  guaiFENesin (MUCINEX) 600 MG 12 hr tablet Take 1 tablet (600 mg total) 2 (two) times daily as needed by mouth for cough or to loosen phlegm. Patient not taking: Reported on 04/28/2017 03/30/17   Nche, Charlene Brooke, NP  Insulin Pen Needle (NOVOFINE) 32G X 6 MM MISC Use to administer victoza every day 09/21/16   Golden Circle, FNP  promethazine-dextromethorphan (PROMETHAZINE-DM) 6.25-15 MG/5ML syrup Take 5 mLs 3 (three) times daily as needed by mouth for cough. Patient not taking: Reported on 04/28/2017 04/05/17    Nche, Charlene Brooke, NP    Family History Family History  Problem Relation Age of Onset  . Emphysema Maternal Grandmother        smoked  . Heart disease Maternal Grandmother 13       MI  . Rheum arthritis Mother   . Allergies Daughter   . Colon cancer Neg Hx  Social History Social History   Tobacco Use  . Smoking status: Never Smoker  . Smokeless tobacco: Never Used  Substance Use Topics  . Alcohol use: No  . Drug use: No     Allergies   Vancomycin; Contrast media [iodinated diagnostic agents]; Aspirin; Ciprofloxacin; and Sulfa antibiotics   Review of Systems Review of Systems 10 Systems reviewed and are negative for acute change except as noted in the HPI.   Physical Exam Updated Vital Signs BP (!) 142/102   Pulse 99   Temp 98.8 F (37.1 C) (Oral)   Resp (!) 22   Ht 5\' 5"  (1.651 m)   Wt 104.3 kg (230 lb)   LMP 04/25/2017   SpO2 98%   BMI 38.27 kg/m   Physical Exam  Constitutional: She is oriented to person, place, and time. She appears well-developed and well-nourished. No distress.  HENT:  Head: Normocephalic and atraumatic.  Nose: Nose normal.  Mouth/Throat: Oropharynx is clear and moist.  Eyes: Conjunctivae and EOM are normal.  Neck: Neck supple.  Cardiovascular: Normal rate, regular rhythm and intact distal pulses. Exam reveals gallop.  No murmur heard. S3 gallop.  Pulmonary/Chest: Effort normal and breath sounds normal. No respiratory distress.  Abdominal: Soft. She exhibits no distension. There is no tenderness. There is no guarding.  Musculoskeletal: Normal range of motion. She exhibits no edema or tenderness.  Neurological: She is alert and oriented to person, place, and time. No cranial nerve deficit. She exhibits normal muscle tone. Coordination normal.  Skin: Skin is warm and dry.  Psychiatric: She has a normal mood and affect.  Nursing note and vitals reviewed.    ED Treatments / Results  Labs (all labs ordered are listed, but  only abnormal results are displayed) Labs Reviewed  BASIC METABOLIC PANEL - Abnormal; Notable for the following components:      Result Value   Glucose, Bld 100 (*)    BUN <5 (*)    All other components within normal limits  BRAIN NATRIURETIC PEPTIDE - Abnormal; Notable for the following components:   B Natriuretic Peptide 478.0 (*)    All other components within normal limits  CBC  I-STAT TROPONIN, ED  I-STAT BETA HCG BLOOD, ED ([facility], [facility], [facility] ONLY)    EKG  EKG Interpretation  Date/Time:  Wednesday April 28 2017 17:33:15 EST Ventricular Rate:  112 PR Interval:  166 QRS Duration: 88 QT Interval:  330 QTC Calculation: 450 R Axis:   -55 Text Interpretation:  Sinus tachycardia Left anterior fascicular block Anterolateral infarct , age undetermined Abnormal ECG agree. no sig change in QRS morphology from previous except bigemeny on prior. Confirmed by Charlesetta Shanks [phone]) on 04/28/2017 9:57:12 PM       Radiology Dg Chest 2 View  Result Date: 04/28/2017 CLINICAL DATA:  Shortness of breath for 2-3 days. Left chest pain. History postpartum cardiomyopathy. EXAM: CHEST  2 VIEW COMPARISON:  01/17/2017 FINDINGS: AICD noted, unchanged. Mild enlargement of the cardiopericardial silhouette, cardiothoracic index 52%. Subtle scarring along the left hemidiaphragm, chronic. No pleural effusion. The lungs appear otherwise clear. Clips in the right upper quadrant likely from prior cholecystectomy. IMPRESSION: 1. Stable mild enlargement of the cardiopericardial silhouette, without edema. 2. Stable minimal scarring along the left hemidiaphragm. 3. AICD in place. Electronically Signed   By: Van Clines M.D.   On: 04/28/2017 18:13    Procedures Procedures (including critical care time)  Medications Ordered in ED Medications  ibuprofen (ADVIL,MOTRIN) tablet 400 mg (400 mg Oral  Given 04/28/17 2001)     Initial Impression / Assessment and Plan / ED Course  I have reviewed the triage vital  signs and the nursing notes.  Pertinent labs & imaging results that were available during my care of the patient were reviewed by me and considered in my medical decision making (see chart for details).     Consult: Reviewed with cardiology, will evaluate in the emergency department for final disposition.  Final Clinical Impressions(s) / ED Diagnoses   Final diagnoses:  Acute congestive heart failure, unspecified heart failure type [facility])  History of cardiomyopathy  Shortness of breath    ED Discharge Orders    None       Charlesetta Shanks, MD 05/06/17 1643

## 2017-04-28 NOTE — ED Triage Notes (Signed)
Pt states increased sob and chest pain x several days.  Was called by her cardiologist's office and told that her heart monitor was reading an increase in fluid.  She increased her torsemide yesterday with no improvement.

## 2017-04-28 NOTE — H&P (Signed)
Cardiology History & Physical    Patient ID: [name] MRN: [id number], DOB: [birth year] Date of Encounter: 04/28/2017, 11:18 PM Primary Physician: Golden Circle, FNP  Chief Complaint: Chest pain, shortness of breath   HPI: [name] is a 41 y.o. 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  She was doing reasonably well until 3-4 days ago, when she noted worsening dyspnea and significant chest pressure and tightness upon minimal exertion.  She has barely been active over the last few days, only moving from her bedroom to the bathroom.  She is short of breath with this.  For the symptoms, she has been up titrating her torsemide, and is currently taking 40 mg twice daily with decent urine output.  Her weight is up approximately 3 pounds from her dry weight.  She has not had significant lower extremity edema, PND, palpitations, presyncope, or syncope.  Due to ongoing chest pressure and shortness of breath to present to [facility] for evaluation.  In the ED, she was somewhat hypertensive with a blood pressure in the 160s over 110s.  She was mildly tachycardic with her heart rate in the low 100s.  Labs including initial troponin were unremarkable.  ECG showed sinus tachycardia with poor R wave progression, unchanged from prior.  She was then admitted to the cardiology service for further management.  Past Medical History:  Diagnosis Date  . AICD (automatic cardioverter/defibrillator) present   . Anxiety   . Arthritis    right shoulder   . Asthma   . CHF (congestive heart failure) ([place])   . Depression   . Diabetes mellitus without complication ([place])   . Dyspnea    comes and goes intermittently mostly with exertion   . Fibroid    age 49  . Gallstones   . Hypertension   . IBS (irritable bowel syndrome)   . Migraine    "monthly" (08/03/2016)  .  OSA on CPAP   . Ovarian cyst    1999; surgically removed  . 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 Seizures ([place])    "as a child" (08/03/2016)  . Termination of pregnancy    due to cardiac risk     Surgical History:  Past Surgical History:  Procedure Laterality Date  . CARDIAC CATHETERIZATION N/A 11/11/2015   Procedure: Right/Left Heart Cath and Coronary Angiography;  Surgeon: Troy Sine, MD;  Location: [place] [facility];  Service: Cardiovascular;  Laterality: N/A;  . CARDIAC CATHETERIZATION  ~ 2015  . CARDIAC DEFIBRILLATOR PLACEMENT  08/03/2016  . CESAREAN SECTION  1999  . COLONOSCOPY WITH PROPOFOL N/A 04/21/2016   Procedure: COLONOSCOPY WITH PROPOFOL;  Surgeon: Jerene Bears, MD;  Location: [facility];  Service: Gastroenterology;  Laterality: N/A;  . ESOPHAGOGASTRODUODENOSCOPY (EGD) WITH PROPOFOL N/A 04/21/2016   Procedure: ESOPHAGOGASTRODUODENOSCOPY (EGD) WITH PROPOFOL;  Surgeon: Jerene Bears, MD;  Location: [facility] ENDOSCOPY;  Service: Gastroenterology;  Laterality: N/A;  . FOOT FRACTURE SURGERY Right ~ 2003  . FRACTURE SURGERY    . ICD IMPLANT N/A 08/03/2016   Procedure: ICD Implant;  Surgeon: Deboraha Sprang, MD;  Location: [place] [facility];  Service: Cardiovascular;  Laterality: N/A;  . LAPAROSCOPIC CHOLECYSTECTOMY  12/2006  . LAPAROSCOPY ABDOMEN DIAGNOSTIC  2008   "cut bile duct w/gallbladder OR; had to go in later & fix  leak; hospitalized for 2 months"  . LEFT HEART CATHETERIZATION WITH CORONARY ANGIOGRAM N/A 02/26/2014   Procedure: LEFT HEART CATHETERIZATION WITH CORONARY ANGIOGRAM;  Surgeon: Jettie Booze, MD;  Location: [facility] C[facility] LAB;  Service: Cardiovascular;  Laterality: N/A;  . OVARIAN CYST REMOVAL Right 1999  . TUBAL LIGATION  1999     Home Meds: Prior to Admission medications   Medication Sig Start Date End Date Taking? Authorizing Provider  albuterol (PROVENTIL HFA;VENTOLIN HFA) 108 (90 Base) MCG/ACT inhaler Inhale 1-2 puffs into the  lungs every 6 (six) hours as needed for wheezing or shortness of breath. **PT NEEDS TO ESTABLISH WITH ANOTHER PROVIDER FOR ADDITIONAL REFILLS** Patient taking differently: Inhale 1-2 puffs into the lungs every 6 (six) hours as needed for wheezing or shortness of breath.  04/26/17  Yes Biagio Borg, MD  Armodafinil (NUVIGIL) 250 MG tablet Take 250 mg by mouth daily.   Yes [provider]  Biotin 5000 MCG TABS Take 5,000 mcg by mouth daily.   Yes [provider]  budesonide-formoterol (SYMBICORT) 160-4.5 MCG/ACT inhaler Inhale 2 puffs into the lungs 2 (two) times daily. 04/13/17  Yes Hulan Saas M, DO  carvedilol (COREG) 25 MG tablet Take 1 tablet (25 mg total) 2 (two) times daily with a meal by mouth. 04/13/17  Yes Bensimhon, Shaune Pascal, MD  colestipol (COLESTID) 1 g tablet Take 2 tablets (2 g total) by mouth daily. Patient taking differently: Take 1 g by mouth 2 (two) times daily.  04/27/16  Yes Pyrtle, Lajuan Lines, MD  cyclobenzaprine (FLEXERIL) 10 MG tablet Take 1 tablet (10 mg total) by mouth 3 (three) times daily as needed for muscle spasms. 01/08/17  Yes Golden Circle, FNP  Diclofenac Sodium 2 % SOLN Apply 1 Pump topically 2 (two) times daily as needed (right shoulder and neck pain).    Yes [provider]  dicyclomine (BENTYL) 20 MG tablet Take 20 mg by mouth 2 (two) times daily as needed for spasms.   Yes [provider]  fluticasone (FLONASE) 50 MCG/ACT nasal spray Place 2 sprays daily as needed into both nostrils for allergies. 03/30/17  Yes Nche, Charlene Brooke, NP  gabapentin (NEURONTIN) 100 MG capsule Take 200 mg by mouth at bedtime as needed (for pain).    Yes [provider]  Ibuprofen-Famotidine 800-26.6 MG TABS Take 1 tablet by mouth 3 (three) times daily as needed. Patient taking differently: Take 1 tablet by mouth 3 (three) times daily as needed (for pain and/or swelling).  04/26/17  Yes Lyndal Pulley, DO  ivabradine (CORLANOR) 5 MG TABS  tablet Take 1.5 tablets (7.5 mg total) by mouth 2 (two) times daily with a meal. 07/10/16  Yes Bensimhon, Shaune Pascal, MD  magnesium oxide (MAG-OX) 400 MG tablet Take 1 tablet (400 mg total) by mouth daily. 11/20/16  Yes Deboraha Sprang, MD  ondansetron (ZOFRAN ODT) 4 MG disintegrating tablet Take 1 tablet (4 mg total) by mouth every 8 (eight) hours as needed for nausea or vomiting. 05/11/16  Yes Ward, Delice Bison, DO  potassium chloride SA (K-DUR,KLOR-CON) 20 MEQ tablet Take 2 tablets (40 mEq total) by mouth daily. 04/27/17  Yes Larey Dresser, MD  PRESCRIPTION MEDICATION CPAP: AT BEDTIME   Yes [provider]  sacubitril-valsartan (ENTRESTO) 97-103 MG Take 1 tablet 2 (two) times daily by mouth. 04/13/17  Yes Bensimhon, Shaune Pascal, MD  spironolactone (ALDACTONE) 25 MG tablet TAKE 1 TABLET BY MOUTH EVERY DAY Patient taking differently: Take 25 mg  by mouth once a day 11/20/16  Yes Crenshaw, Denice Bors, MD  torsemide (DEMADEX) 20 MG tablet Take 2 tablets (40 mg total) by mouth 2 (two) times daily. Patient taking differently: Take 40 mg by mouth 2 (two) times daily. AND MAY TAKE AN ADDITIONAL DOSE OF 20 MG ONCE A DAY AS NEEDED FOR A WEIGHT GAIN OF >3-4 POUNDS IN 24 HRS 12/15/16 04/28/17 Yes Belva Crome, MD  venlafaxine XR (EFFEXOR-XR) 75 MG 24 hr capsule Take 225 mg by mouth daily with breakfast.   Yes [provider]  VICTOZA 18 MG/3ML SOPN Inject 0.3 mLs (1.8 mg total) into the skin daily. 01/08/17  Yes Golden Circle, FNP  vitamin B-12 (CYANOCOBALAMIN) 500 MCG tablet Take 500 mcg by mouth daily.   Yes [provider]  Vitamin D, Ergocalciferol, (DRISDOL) 50000 units CAPS capsule TAKE 1 CAPSULE (50,000 UNITS TOTAL) BY MOUTH EVERY 7 (SEVEN) DAYS. Patient taking differently: Take 50,000 Units by mouth every Wednesday.  03/22/17  Yes Lyndal Pulley, DO  amoxicillin-clavulanate (AUGMENTIN) 875-125 MG tablet Take 1 tablet 2 (two) times daily by mouth. Patient not taking: Reported on  04/28/2017 04/02/17   Nche, Charlene Brooke, NP  B-D ULTRAFINE III SHORT PEN 31G X 8 MM MISC USE TO TEST BLOOD SUGARS 1-2 TIMES A DAY AS NEEDED. 12/21/16   Golden Circle, FNP  benzonatate (TESSALON) 100 MG capsule Take 1 capsule (100 mg total) 3 (three) times daily as needed by mouth for cough. Patient not taking: Reported on 04/28/2017 03/30/17   Nche, Charlene Brooke, NP  guaiFENesin (MUCINEX) 600 MG 12 hr tablet Take 1 tablet (600 mg total) 2 (two) times daily as needed by mouth for cough or to loosen phlegm. Patient not taking: Reported on 04/28/2017 03/30/17   Nche, Charlene Brooke, NP  Insulin Pen Needle (NOVOFINE) 32G X 6 MM MISC Use to administer victoza every day 09/21/16   Golden Circle, FNP  promethazine-dextromethorphan (PROMETHAZINE-DM) 6.25-15 MG/5ML syrup Take 5 mLs 3 (three) times daily as needed by mouth for cough. Patient not taking: Reported on 04/28/2017 04/05/17   Nche, Charlene Brooke, NP    Allergies:  Allergies  Allergen Reactions  . Vancomycin Other (See Comments)    "did something to my kidneys," PROGRESSED TO KIDNEY FAILURE!!  . Contrast Media [Iodinated Diagnostic Agents] Other (See Comments)    Multiple CT contrast studies done over 2 weeks caused ARF  . Aspirin Other (See Comments)    Wheezing, "but patient still takes if she needs to"  . Ciprofloxacin Itching and Rash  . Sulfa Antibiotics Itching and Rash    Social History   Socioeconomic History  . Marital status: Married    Spouse name: Not on file  . Number of children: 1  . Years of education: Not on file  . Highest education level: Not on file  Social Needs  . Financial resource strain: Not on file  . Food insecurity - worry: Not on file  . Food insecurity - inability: Not on file  . Transportation needs - medical: Not on file  . Transportation needs - non-medical: Not on file  Occupational History  . Occupation: stay at home mom  Tobacco Use  . Smoking status: Never Smoker  . Smokeless tobacco:  Never Used  Substance and Sexual Activity  . Alcohol use: No  . Drug use: No  . Sexual activity: Yes  Other Topics Concern  . Not on file  Social History Narrative  . Not on  file     Family History  Problem Relation Age of Onset  . Emphysema Maternal Grandmother        smoked  . Heart disease Maternal Grandmother 53       MI  . Rheum arthritis Mother   . Allergies Daughter   . Colon cancer Neg Hx     Review of Systems: All other systems reviewed and are otherwise negative except as noted above.  Labs:   Lab Results  Component Value Date   WBC 6.7 04/28/2017   HGB 12.9 04/28/2017   HCT 39.6 04/28/2017   MCV 88.6 04/28/2017   PLT 247 04/28/2017    Recent Labs  Lab 04/28/17 1741  NA 137  K 4.1  CL 104  CO2 25  BUN <5*  CREATININE 0.78  CALCIUM 9.0  GLUCOSE 100*   No results for input(s): CKTOTAL, CKMB, TROPONINI in the last 72 hours. Lab Results  Component Value Date   CHOL 160 11/09/2015   HDL 58 11/09/2015   LDLCALC 88 11/09/2015   TRIG 70 11/09/2015   Lab Results  Component Value Date   DDIMER 1.79 (H) 11/08/2015    Radiology/Studies:  Dg Chest 2 View  Result Date: 04/28/2017 CLINICAL DATA:  Shortness of breath for 2-3 days. Left chest pain. History postpartum cardiomyopathy. EXAM: CHEST  2 VIEW COMPARISON:  01/17/2017 FINDINGS: AICD noted, unchanged. Mild enlargement of the cardiopericardial silhouette, cardiothoracic index 52%. Subtle scarring along the left hemidiaphragm, chronic. No pleural effusion. The lungs appear otherwise clear. Clips in the right upper quadrant likely from prior cholecystectomy. IMPRESSION: 1. Stable mild enlargement of the cardiopericardial silhouette, without edema. 2. Stable minimal scarring along the left hemidiaphragm. 3. AICD in place. Electronically Signed   By: Van Clines M.D.   On: 04/28/2017 18:13   Wt Readings from Last 3 Encounters:  04/28/17 104.3 kg (230 lb)  03/30/17 108 kg (238 lb)  01/17/17 108.2  kg (238 lb 8 oz)    EKG: Sinus tachycardia, left anterior fascicular block, poor R wave progression.  Physical Exam: Blood pressure (!) 142/102, pulse 99, temperature 98.8 F (37.1 C), temperature source Oral, resp. rate (!) 22, height 5\' 5"  (1.651 m), weight 104.3 kg (230 lb), last menstrual period 04/25/2017, SpO2 98 %. Body mass index is 38.27 kg/m. General: Well developed, well nourished, in no acute distress. Head: Normocephalic, atraumatic, sclera non-icteric, no xanthomas, nares are without discharge.  Neck: Negative for carotid bruits.  JVP is 7-8.. Lungs: Clear bilaterally to auscultation without wheezes, rales, or rhonchi. Breathing is unlabored. Heart: Tachycardic, regular with normal S1 S2. No murmurs, rubs, or gallops appreciated. Abdomen: Soft, non-tender, non-distended with normoactive bowel sounds. No hepatomegaly. No rebound/guarding. No obvious abdominal masses. Msk:  Strength and tone appear normal for age. Extremities: No clubbing or cyanosis. No edema.  Distal pedal pulses are 2+ and equal bilaterally.  Lukewarm to touch. Neuro: Alert and oriented X 3. No focal deficit. No facial asymmetry. Moves all extremities spontaneously. Psych:  Responds to questions appropriately with a normal affect.    Assessment and Plan  41 year old female with advanced nonischemic cardiomyopathy who presents with chest pain and shortness of breath.  1.  Acute on chronic systolic heart failure: Fortunately, by history and exam she seems only mildly volume up.  Will administer 1 dose of IV Lasix this evening and monitor her response.  My optimistic that she would do reasonably well resuming an oral regimen after this.  We will continue her home  goal directed medical therapy for heart failure including carvedilol, Entresto, and Spironolactone.  We will also continue home ivabradine, will consider up titrating given resting tachycardia.  2.  Chest pain: Less likely that this represents an acute  coronary syndrome, given she has clean coronaries in the past.  This is more likely a reflection of very mild congestion.  We will continue to cycle cardiac enzymes and follow chest pain clinically.  Could consider ruling out PE if this fails to improve with diuresis.  3.  Diabetes: Holding home oral agents.  Will follow sugars.  4.  Asthma: Continue home inhaler regimen.  Signed, Doylene Canning, MD 04/28/2017, 11:18 PM

## 2017-04-29 ENCOUNTER — Other Ambulatory Visit: Payer: Self-pay

## 2017-04-29 ENCOUNTER — Observation Stay (HOSPITAL_COMMUNITY): Payer: BLUE CROSS/BLUE SHIELD

## 2017-04-29 DIAGNOSIS — R071 Chest pain on breathing: Secondary | ICD-10-CM | POA: Diagnosis not present

## 2017-04-29 DIAGNOSIS — R079 Chest pain, unspecified: Secondary | ICD-10-CM | POA: Diagnosis not present

## 2017-04-29 DIAGNOSIS — R0602 Shortness of breath: Secondary | ICD-10-CM | POA: Diagnosis not present

## 2017-04-29 DIAGNOSIS — I5023 Acute on chronic systolic (congestive) heart failure: Secondary | ICD-10-CM | POA: Diagnosis present

## 2017-04-29 DIAGNOSIS — Z8679 Personal history of other diseases of the circulatory system: Secondary | ICD-10-CM | POA: Diagnosis not present

## 2017-04-29 HISTORY — DX: Acute on chronic systolic (congestive) heart failure: I50.23

## 2017-04-29 LAB — GLUCOSE, CAPILLARY
Glucose-Capillary: 101 mg/dL — ABNORMAL HIGH (ref 65–99)
Glucose-Capillary: 112 mg/dL — ABNORMAL HIGH (ref 65–99)
Glucose-Capillary: 130 mg/dL — ABNORMAL HIGH (ref 65–99)

## 2017-04-29 LAB — BASIC METABOLIC PANEL
Anion gap: 11 (ref 5–15)
BUN: 11 mg/dL (ref 6–20)
CO2: 22 mmol/L (ref 22–32)
Calcium: 9.3 mg/dL (ref 8.9–10.3)
Chloride: 102 mmol/L (ref 101–111)
Creatinine, Ser: 1.01 mg/dL — ABNORMAL HIGH (ref 0.44–1.00)
GFR calc Af Amer: >60 mL/min (ref >60)
GFR calc non Af Amer: >60 mL/min (ref >60)
Glucose, Bld: 132 mg/dL — ABNORMAL HIGH (ref 65–99)
Potassium: 4.3 mmol/L (ref 3.5–5.1)
Sodium: 135 mmol/L (ref 135–145)

## 2017-04-29 LAB — D-DIMER, QUANTITATIVE: D-Dimer, Quant: 0.81 ug/mL-FEU — ABNORMAL HIGH (ref 0.00–0.50)

## 2017-04-29 LAB — TROPONIN I
Troponin I: <0.03 ng/mL (ref <0.03)
Troponin I: <0.03 ng/mL (ref <0.03)

## 2017-04-29 LAB — HIV ANTIBODY (ROUTINE TESTING W REFLEX): HIV Screen 4th Generation wRfx: NONREACTIVE

## 2017-04-29 MED ORDER — FUROSEMIDE 10 MG/ML IJ SOLN
80.0000 mg | Freq: Once | INTRAMUSCULAR | Status: AC
  Administered 2017-04-29: 80 mg via INTRAVENOUS
  Filled 2017-04-29: qty 8

## 2017-04-29 MED ORDER — ZOLPIDEM TARTRATE 5 MG PO TABS
5.0000 mg | ORAL_TABLET | Freq: Every evening | ORAL | Status: DC | PRN
  Administered 2017-04-29: 5 mg via ORAL
  Filled 2017-04-29: qty 1

## 2017-04-29 MED ORDER — IOPAMIDOL (ISOVUE-370) INJECTION 76%
INTRAVENOUS | Status: AC
  Administered 2017-04-29: 100 mL
  Filled 2017-04-29: qty 100

## 2017-04-29 MED ORDER — NITROGLYCERIN 0.4 MG SL SUBL: 0.4000 mg | SUBLINGUAL_TABLET | SUBLINGUAL | Status: DC | PRN

## 2017-04-29 MED ORDER — POTASSIUM CHLORIDE CRYS ER 20 MEQ PO TBCR
40.0000 meq | EXTENDED_RELEASE_TABLET | Freq: Every day | ORAL | Status: DC
  Administered 2017-04-29 – 2017-04-30 (×2): 40 meq via ORAL
  Filled 2017-04-29 (×2): qty 2

## 2017-04-29 MED ORDER — MODAFINIL 100 MG PO TABS
200.0000 mg | ORAL_TABLET | Freq: Every day | ORAL | Status: DC
  Administered 2017-04-29 – 2017-04-30 (×2): 200 mg via ORAL
  Filled 2017-04-29 (×2): qty 2

## 2017-04-29 NOTE — Progress Notes (Addendum)
Patient complains of a constant chest pain that's been going on for 3days, paged cardiology. Verbal order for nitro PRN and Tylenol is ok to give.

## 2017-04-29 NOTE — Progress Notes (Signed)
DAILY PROGRESS NOTE   Patient Name: [name] Date of Encounter: 04/29/2017  Chief Complaint   Shortness of breath, chest pressure  Patient Profile   41 yo female patient with NICM (peripartum CM) who presented with shortness of breath and tachycardia with 3 lb weight gain, suspected to be in acute systolic congestive heart failure. Followed by the CHF clinic.  Subjective   Breathing has improved overnight - given IV lasix with +benefit - now 1.3L negative overnight. Weight measured up 3 lbs - ?accuracy.  Had URI in early Nov around birthday - lots of congestion, not really improved. Now using her inhaler more frequently. No fever, chills, CXR negative - no leukocytosis.   Objective   Vitals:   04/29/17 0130 04/29/17 0245 04/29/17 0601 04/29/17 0800  BP: (!) 136/95 (!) 149/96 117/67 (!) 79/56  Pulse: 93 (!) 107 (!) 104 100  Resp: _0 Temp:  (!) 97.4 F (36.3 C) 98 F (36.7 C) 97.8 F (36.6 C)  TempSrc:  Oral Oral Oral  SpO2: 99% 98% 97% 96%  Weight:  233 lb 4.8 oz (105.8 kg)    Height:  _1  (1.651 m)      Intake/Output Summary (Last 24 hours) at 04/29/2017 2683 Last data filed at 04/29/2017 4196 Gross per 24 hour  Intake 240 ml  Output 1600 ml  Net -1360 ml   Filed Weights   04/28/17 1738 04/29/17 0245  Weight: 230 lb (104.3 kg) 233 lb 4.8 oz (105.8 kg)    Physical Exam   General appearance: alert, no distress and mildly obese Neck: JVD - 3 cm above sternal notch, no carotid bruit and thyroid not enlarged, symmetric, no tenderness/mass/nodules Lungs: clear to auscultation bilaterally Heart: regular rate and rhythm and S1, S2 normal Abdomen: soft, non-tender; bowel sounds normal; no masses,  no organomegaly Extremities: extremities normal, atraumatic, no cyanosis or edema Pulses: 2+ and symmetric Skin: Skin color, texture, turgor normal. No rashes or lesions Neurologic: Grossly normal Psych: Pleasant  Inpatient Medications    Scheduled  Meds: . carvedilol  25 mg Oral BID WC  . colestipol  1 g Oral BID WC  . fluticasone furoate-vilanterol  1 puff Inhalation Daily  . ivabradine  7.5 mg Oral BID WC  . modafinil  200 mg Oral Q breakfast  . sacubitril-valsartan  1 tablet Oral BID  . spironolactone  25 mg Oral Daily  . venlafaxine XR  225 mg Oral Q breakfast  . vitamin B-12  500 mcg Oral Daily    Continuous Infusions:   PRN Meds: acetaminophen, albuterol, diclofenac sodium, dicyclomine, fluticasone, gabapentin, ondansetron (ZOFRAN) IV   Labs   Results for orders placed or performed during the hospital encounter of 04/28/17 (from the past 48 hour(s))  Basic metabolic panel     Status: Abnormal   Collection Time: 04/28/17  5:41 PM  Result Value Ref Range   Sodium 137 135 - 145 mmol/L   Potassium 4.1 3.5 - 5.1 mmol/L   Chloride 104 101 - 111 mmol/L   CO2 25 22 - 32 mmol/L   Glucose, Bld 100 (H) 65 - 99 mg/dL   BUN <5 (L) 6 - 20 mg/dL   Creatinine, Ser 0.78 0.44 - 1.00 mg/dL   Calcium 9.0 8.9 - 10.3 mg/dL   GFR calc non Af Amer >60 >60 mL/min   GFR calc Af Amer >60 >60 mL/min    Comment: (NOTE) The eGFR has been calculated using the CKD EPI  equation. This calculation has not been validated in all clinical situations. eGFR's persistently <60 mL/min signify possible Chronic Kidney Disease.    Anion gap 8 5 - 15  CBC     Status: None   Collection Time: 04/28/17  5:41 PM  Result Value Ref Range   WBC 6.7 4.0 - 10.5 K/uL   RBC 4.47 3.87 - 5.11 MIL/uL   Hemoglobin 12.9 12.0 - 15.0 g/dL   HCT 39.6 36.0 - 46.0 %   MCV 88.6 78.0 - 100.0 fL   MCH 28.9 26.0 - 34.0 pg   MCHC 32.6 30.0 - 36.0 g/dL   RDW 13.6 11.5 - 15.5 %   Platelets 247 150 - 400 K/uL  Brain natriuretic peptide     Status: Abnormal   Collection Time: 04/28/17  5:42 PM  Result Value Ref Range   B Natriuretic Peptide 478.0 (H) 0.0 - 100.0 pg/mL  I-stat troponin, ED     Status: None   Collection Time: 04/28/17  6:01 PM  Result Value Ref Range    Troponin i, poc 0.00 0.00 - 0.08 ng/mL   Comment 3            Comment: Due to the release kinetics of cTnI, a negative result within the first hours of the onset of symptoms does not rule out myocardial infarction with certainty. If myocardial infarction is still suspected, repeat the test at appropriate intervals.   I-Stat beta hCG blood, ED     Status: None   Collection Time: 04/28/17  6:01 PM  Result Value Ref Range   I-stat hCG, quantitative <5.0 <5 mIU/mL   Comment 3            Comment:   GEST. AGE      CONC.  (mIU/mL)   <=1 WEEK        5 - 50     2 WEEKS       50 - 500     3 WEEKS       100 - 10,000     4 WEEKS     1,000 - 30,000        FEMALE AND NON-PREGNANT FEMALE:     LESS THAN 5 mIU/mL   Troponin I-serum (0, 3, 6 hours)     Status: None   Collection Time: 04/29/17 12:17 AM  Result Value Ref Range   Troponin I <0.03 <0.03 ng/mL  Troponin I-serum (0, 3, 6 hours)     Status: None   Collection Time: 04/29/17  4:45 AM  Result Value Ref Range   Troponin I <0.03 <0.03 ng/mL  Glucose, capillary     Status: Abnormal   Collection Time: 04/29/17  7:52 AM  Result Value Ref Range   Glucose-Capillary 101 (H) 65 - 99 mg/dL   Comment 1 Notify RN    Comment 2 Document in Chart     ECG   N/A  Telemetry   Sinus tachy - Personally Reviewed  Radiology    Dg Chest 2 View  Result Date: 04/28/2017 CLINICAL DATA:  Shortness of breath for 2-3 days. Left chest pain. History postpartum cardiomyopathy. EXAM: CHEST  2 VIEW COMPARISON:  01/17/2017 FINDINGS: AICD noted, unchanged. Mild enlargement of the cardiopericardial silhouette, cardiothoracic index 52%. Subtle scarring along the left hemidiaphragm, chronic. No pleural effusion. The lungs appear otherwise clear. Clips in the right upper quadrant likely from prior cholecystectomy. IMPRESSION: 1. Stable mild enlargement of the cardiopericardial silhouette, without edema. 2. Stable minimal scarring  along the left hemidiaphragm. 3. AICD  in place. Electronically Signed   By: Van Clines M.D.   On: 04/28/2017 18:13    Cardiac Studies   N/A  Assessment   Principal Problem:   Acute on chronic systolic (congestive) heart failure (HCC) Active Problems:   Chest pain   Plan   1. Good diuresis overnight - weight ?innacurate, went up. Does not really feel better despite lasix. Has had dull chest discomfort and tachycardia - 3 weeks of URI symptoms with congestion, wheeze, although lungs clear today. CXR unremarkable - no leukocytosis or fever. ?Remains tachycardic overnight - BP low this am. Give additional IV lasix today - add-on d-dimer. If abnormal, will pursue CT angiogram to evaluate for possible PE and evaluate pulmonary parenchyma. Will d/w Dr. Haroldine Laws.  Time Spent Directly with Patient:  I have spent a total of 35 minutes with the patient reviewing hospital notes, telemetry, EKGs, labs and examining the patient as well as establishing an assessment and plan that was discussed personally with the patient. > 50% of time was spent in direct patient care.  Length of Stay:  LOS: 0 days   Pixie Casino, MD, [facility], [place] Director of the [facility] Lipid Disorders &  Cardiovascular Risk Reduction Clinic Attending Cardiologist  Direct Dial: [phone]  Fax: [phone]  Website:  [email or web address] 04/29/2017, 9:23 AM

## 2017-04-29 NOTE — Care Management Note (Signed)
Case Management Note  Patient Details  Name: [name] MRN: [id number] Date of Birth: 20-Jan-1976  Subjective/Objective:  Chest Pain                Action/Plan: Patient goes to [facility] for Primary Care; he PCP left and she is in the process of getting established with a new physician; CM called the practice and they will call her with a date and time for hospital follow up visit with Dr Cathlean Cower; private insurance with [facility] with prescription drug coverage;  Expected Discharge Date:  04/29/17               Expected Discharge Plan:  Home/Self Care  Discharge planning Services  CM Consult  Status of Service:  In process, will continue to follow  Sherrilyn Rist [phone] 04/29/2017, 11:37 AM

## 2017-04-29 NOTE — Progress Notes (Signed)
Called respiratory about patients CPAP @ [facility]. Patient didn't have order, received verbal order from Dr. Percival Spanish for CPAP.

## 2017-04-29 NOTE — Progress Notes (Signed)

## 2017-04-30 DIAGNOSIS — Z8679 Personal history of other diseases of the circulatory system: Secondary | ICD-10-CM

## 2017-04-30 DIAGNOSIS — I5023 Acute on chronic systolic (congestive) heart failure: Secondary | ICD-10-CM | POA: Diagnosis not present

## 2017-04-30 DIAGNOSIS — R071 Chest pain on breathing: Secondary | ICD-10-CM | POA: Diagnosis not present

## 2017-04-30 LAB — BASIC METABOLIC PANEL
Anion gap: 9 (ref 5–15)
BUN: 12 mg/dL (ref 6–20)
CO2: 28 mmol/L (ref 22–32)
Calcium: 9.3 mg/dL (ref 8.9–10.3)
Chloride: 100 mmol/L — ABNORMAL LOW (ref 101–111)
Creatinine, Ser: 1.11 mg/dL — ABNORMAL HIGH (ref 0.44–1.00)
GFR calc Af Amer: >60 mL/min (ref >60)
GFR calc non Af Amer: 60 mL/min — ABNORMAL LOW (ref >60)
Glucose, Bld: 107 mg/dL — ABNORMAL HIGH (ref 65–99)
Potassium: 3.9 mmol/L (ref 3.5–5.1)
Sodium: 137 mmol/L (ref 135–145)

## 2017-04-30 MED ORDER — COLESTIPOL HCL 1 G PO TABS: 1.0000 g | ORAL_TABLET | Freq: Two times a day (BID) | ORAL | Status: DC

## 2017-04-30 MED ORDER — IBUPROFEN-FAMOTIDINE 800-26.6 MG PO TABS: 1.0000 | ORAL_TABLET | Freq: Three times a day (TID) | ORAL | Status: DC | PRN

## 2017-04-30 MED ORDER — CARVEDILOL 12.5 MG PO TABS
12.5000 mg | ORAL_TABLET | Freq: Two times a day (BID) | ORAL | Status: DC
  Administered 2017-04-30: 12.5 mg via ORAL
  Filled 2017-04-30: qty 1

## 2017-04-30 MED ORDER — NITROGLYCERIN 0.4 MG SL SUBL: 0.4000 mg | SUBLINGUAL_TABLET | SUBLINGUAL | 4 refills | Status: DC | PRN

## 2017-04-30 MED ORDER — ZOLPIDEM TARTRATE 5 MG PO TABS
5.0000 mg | ORAL_TABLET | Freq: Every evening | ORAL | 0 refills | Status: DC | PRN
Start: 2017-04-30 — End: 2017-05-10

## 2017-04-30 MED ORDER — CARVEDILOL 12.5 MG PO TABS
12.5000 mg | ORAL_TABLET | Freq: Two times a day (BID) | ORAL | Status: DC
Start: 2017-04-30 — End: 2017-08-02

## 2017-04-30 MED ORDER — TORSEMIDE 20 MG PO TABS: 40.0000 mg | ORAL_TABLET | Freq: Two times a day (BID) | ORAL | 6 refills | Status: DC

## 2017-04-30 NOTE — Progress Notes (Signed)
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 Followed by the CHF clinic.  Subjective   3L negative - creatinine has risen to 1.11. Holding IV diuretics today. Chest pain has improved - she can take deeper breaths - ?if this was all CHF. JVP flat today - weight down to 228 lbs.  Objective   Vitals:   04/30/17 0013 04/30/17 0648 04/30/17 0825 04/30/17 0856  BP: 103/62 93/60 (!) 100/46   Pulse: 80 85 88 86  Resp: 18 18  18   Temp: 98.3 F (36.8 C) 98.1 F (36.7 C)    TempSrc: Oral Oral    SpO2: 97% 100%  100%  Weight:  228 lb 9.6 oz (103.7 kg)    Height:        Intake/Output Summary (Last 24 hours) at 04/30/2017 0945 Last data filed at 04/30/2017 0848 Gross per 24 hour  Intake 600 ml  Output 2400 ml  Net -1800 ml   Filed Weights   04/28/17 1738 04/29/17 0245 04/30/17 0648  Weight: 230 lb (104.3 kg) 233 lb 4.8 oz (105.8 kg) 228 lb 9.6 oz (103.7 kg)    Physical Exam   General appearance: alert, no distress and mildly obese Neck: no carotid bruit, no JVD and thyroid not enlarged, symmetric, no tenderness/mass/nodules Lungs: clear to auscultation bilaterally Heart: regular rate and rhythm and S1, S2 normal Abdomen: soft, non-tender; bowel sounds normal; no masses,  no organomegaly Extremities: extremities normal, atraumatic, no cyanosis or edema Pulses: 2+ and symmetric Skin: Skin color, texture, turgor normal. No rashes or lesions Neurologic: Grossly normal Psych: Pleasant  Inpatient Medications    Scheduled Meds: . carvedilol  12.5 mg Oral BID WC  . colestipol  1 g Oral BID WC  . fluticasone furoate-vilanterol  1 puff Inhalation Daily  . ivabradine  7.5 mg Oral BID WC  .  modafinil  200 mg Oral Q breakfast  . potassium chloride SA  40 mEq Oral Daily  . sacubitril-valsartan  1 tablet Oral BID  . spironolactone  25 mg Oral Daily  . venlafaxine XR  225 mg Oral Q breakfast  . vitamin B-12  500 mcg Oral Daily    Continuous Infusions:   PRN Meds: acetaminophen, albuterol, diclofenac sodium, dicyclomine, fluticasone, gabapentin, nitroGLYCERIN, ondansetron (ZOFRAN) IV, zolpidem   Labs   Results for orders placed or performed during the hospital encounter of 04/28/17 (from the past 48 hour(s))  Basic metabolic panel     Status: Abnormal   Collection Time: 04/28/17  5:41 PM  Result Value Ref Range   Sodium 137 135 - 145 mmol/L   Potassium 4.1 3.5 - 5.1 mmol/L   Chloride 104 101 - 111 mmol/L   CO2 25 22 - 32 mmol/L   Glucose, Bld 100 (H) 65 - 99 mg/dL   BUN <5 (L) 6 - 20 mg/dL   Creatinine, Ser 0.78 0.44 - 1.00 mg/dL   Calcium 9.0 8.9 - 10.3 mg/dL   GFR calc non Af Amer >60 >60 mL/min   GFR calc Af Amer >60 >60 mL/min    Comment: (NOTE) The eGFR has been calculated using the CKD EPI equation. This calculation has not been validated in all clinical situations. eGFR's  persistently <60 mL/min signify possible Chronic Kidney Disease.    Anion gap 8 5 - 15  CBC     Status: None   Collection Time: 04/28/17  5:41 PM  Result Value Ref Range   WBC 6.7 4.0 - 10.5 K/uL   RBC 4.47 3.87 - 5.11 MIL/uL   Hemoglobin 12.9 12.0 - 15.0 g/dL   HCT 39.6 36.0 - 46.0 %   MCV 88.6 78.0 - 100.0 fL   MCH 28.9 26.0 - 34.0 pg   MCHC 32.6 30.0 - 36.0 g/dL   RDW 13.6 11.5 - 15.5 %   Platelets 247 150 - 400 K/uL  Brain natriuretic peptide     Status: Abnormal   Collection Time: 04/28/17  5:42 PM  Result Value Ref Range   B Natriuretic Peptide 478.0 (H) 0.0 - 100.0 pg/mL  I-stat troponin, ED     Status: None   Collection Time: 04/28/17  6:01 PM  Result Value Ref Range   Troponin i, poc 0.00 0.00 - 0.08 ng/mL   Comment 3            Comment: Due to the release kinetics  of cTnI, a negative result within the first hours of the onset of symptoms does not rule out myocardial infarction with certainty. If myocardial infarction is still suspected, repeat the test at appropriate intervals.   I-Stat beta hCG blood, ED     Status: None   Collection Time: 04/28/17  6:01 PM  Result Value Ref Range   I-stat hCG, quantitative <5.0 <5 mIU/mL   Comment 3            Comment:   GEST. AGE      CONC.  (mIU/mL)   <=1 WEEK        5 - 50     2 WEEKS       50 - 500     3 WEEKS       100 - 10,000     4 WEEKS     1,000 - 30,000        FEMALE AND NON-PREGNANT FEMALE:     LESS THAN 5 mIU/mL   HIV antibody (Routine Testing)     Status: None   Collection Time: 04/29/17 12:17 AM  Result Value Ref Range   HIV Screen 4th Generation wRfx Non Reactive Non Reactive    Comment: (NOTE) Performed At: [facility] [place], [place] [id number] Rush Farmer MD [id number]   Troponin I-serum (0, 3, 6 hours)     Status: None   Collection Time: 04/29/17 12:17 AM  Result Value Ref Range   Troponin I <0.03 <0.03 ng/mL  Troponin I-serum (0, 3, 6 hours)     Status: None   Collection Time: 04/29/17  4:45 AM  Result Value Ref Range   Troponin I <0.03 <0.03 ng/mL  Glucose, capillary     Status: Abnormal   Collection Time: 04/29/17  7:52 AM  Result Value Ref Range   Glucose-Capillary 101 (H) 65 - 99 mg/dL   Comment 1 Notify RN    Comment 2 Document in Chart   D-dimer, quantitative (not at [facility])     Status: Abnormal   Collection Time: 04/29/17  9:41 AM  Result Value Ref Range   D-Dimer, Quant 0.81 (H) 0.00 - 0.50 ug/mL-FEU    Comment: (NOTE) At the manufacturer cut-off of 0.50 ug/mL FEU, this assay has been documented to exclude PE with a sensitivity and negative predictive  value of 97 to 99%.  At this time, this assay has not been approved by the FDA to exclude DVT/VTE. Results should be correlated with clinical presentation.   Basic metabolic panel      Status: Abnormal   Collection Time: 04/29/17  9:41 AM  Result Value Ref Range   Sodium 135 135 - 145 mmol/L   Potassium 4.3 3.5 - 5.1 mmol/L    Comment: SLIGHT HEMOLYSIS   Chloride 102 101 - 111 mmol/L   CO2 22 22 - 32 mmol/L   Glucose, Bld 132 (H) 65 - 99 mg/dL   BUN 11 6 - 20 mg/dL   Creatinine, Ser 1.01 (H) 0.44 - 1.00 mg/dL   Calcium 9.3 8.9 - 10.3 mg/dL   GFR calc non Af Amer >60 >60 mL/min   GFR calc Af Amer >60 >60 mL/min    Comment: (NOTE) The eGFR has been calculated using the CKD EPI equation. This calculation has not been validated in all clinical situations. eGFR's persistently <60 mL/min signify possible Chronic Kidney Disease.    Anion gap 11 5 - 15  Glucose, capillary     Status: Abnormal   Collection Time: 04/29/17 11:40 AM  Result Value Ref Range   Glucose-Capillary 112 (H) 65 - 99 mg/dL   Comment 1 Notify RN    Comment 2 Document in Chart   Glucose, capillary     Status: Abnormal   Collection Time: 04/29/17  5:14 PM  Result Value Ref Range   Glucose-Capillary 130 (H) 65 - 99 mg/dL   Comment 1 Notify RN    Comment 2 Document in Chart   Basic metabolic panel     Status: Abnormal   Collection Time: 04/30/17  3:33 AM  Result Value Ref Range   Sodium 137 135 - 145 mmol/L   Potassium 3.9 3.5 - 5.1 mmol/L   Chloride 100 (L) 101 - 111 mmol/L   CO2 28 22 - 32 mmol/L   Glucose, Bld 107 (H) 65 - 99 mg/dL   BUN 12 6 - 20 mg/dL   Creatinine, Ser 1.11 (H) 0.44 - 1.00 mg/dL   Calcium 9.3 8.9 - 10.3 mg/dL   GFR calc non Af Amer 60 (L) >60 mL/min   GFR calc Af Amer >60 >60 mL/min    Comment: (NOTE) The eGFR has been calculated using the CKD EPI equation. This calculation has not been validated in all clinical situations. eGFR's persistently <60 mL/min signify possible Chronic Kidney Disease.    Anion gap 9 5 - 15    ECG   N/A  Telemetry   Sinus tachy - Personally Reviewed  Radiology    Dg Chest 2 View  Result Date: 04/28/2017 CLINICAL DATA:   Shortness of breath for 2-3 days. Left chest pain. History postpartum cardiomyopathy. EXAM: CHEST  2 VIEW COMPARISON:  01/17/2017 FINDINGS: AICD noted, unchanged. Mild enlargement of the cardiopericardial silhouette, cardiothoracic index 52%. Subtle scarring along the left hemidiaphragm, chronic. No pleural effusion. The lungs appear otherwise clear. Clips in the right upper quadrant likely from prior cholecystectomy. IMPRESSION: 1. Stable mild enlargement of the cardiopericardial silhouette, without edema. 2. Stable minimal scarring along the left hemidiaphragm. 3. AICD in place. Electronically Signed   By: Van Clines M.D.   On: 04/28/2017 18:13   Ct Angio Chest Pe W Or Wo Contrast  Result Date: 04/29/2017 CLINICAL DATA:  Shortness of breath, cough, chest pain. Suspect pulmonary embolism. History of diabetes, hypertension. EXAM: CT ANGIOGRAPHY CHEST WITH CONTRAST TECHNIQUE:  Multidetector CT imaging of the chest was performed using the standard protocol during bolus administration of intravenous contrast. Multiplanar CT image reconstructions and MIPs were obtained to evaluate the vascular anatomy. CONTRAST:  136m ISOVUE-370 IOPAMIDOL (ISOVUE-370) INJECTION 76% COMPARISON:  Chest radiograph April 28, 2017 and CT chest November 09, 2015 FINDINGS: CARDIOVASCULAR: Adequate contrast opacification of the pulmonary artery's. Main pulmonary artery is not enlarged. No pulmonary arterial filling defects to the level of the subsegmental branches. Heart size is mildly enlarged, no right heart strain. No pericardial effusion. Thoracic aorta is normal course and caliber, unremarkable. Mild pulmonary vascular congestion. LEFT ICD. MEDIASTINUM/NODES: No lymphadenopathy by CT size criteria. LUNGS/PLEURA: Tracheobronchial tree is patent, no pneumothorax. Mild bronchial wall thickening. No pleural effusions, focal consolidations, pulmonary nodules or masses. UPPER ABDOMEN: Included view of the abdomen is unremarkable.  MUSCULOSKELETAL: Nonacute. Mild RIGHT glenohumeral osteoarthrosis. Mild degenerative change of thoracic spine. Mild dextroscoliosis on this nonweightbearing examination. Review of the MIP images confirms the above findings. IMPRESSION: 1. No acute pulmonary embolism. 2. Mild cardiomegaly. 3. Pulmonary vascular congestion. Mild bronchial wall thickening seen with pulmonary edema, reactive airway disease or bronchitis. No focal consolidation. Electronically Signed   By: CElon AlasM.D.   On: 04/29/2017 16:21    Cardiac Studies   N/A  Assessment   Principal Problem:   Acute on chronic systolic (congestive) heart failure (HCC) Active Problems:   Chest pain   Plan   1. Diuresed 3L - weight down to 228 lbs. Breathing better today. BP has been soft. Will likely need to decrease carvedilol to 12.5 mg BID at discharge. Advised her to not take torsemide today and then restart her home torsemide dose tomorrow (40 mg BID). Keep follow-up appointment with Dr. BHaroldine Lawson 12/17. OPalmdalefor d/c home today.  Time Spent Directly with Patient:  I have spent a total of 15 minutes with the patient reviewing hospital notes, telemetry, EKGs, labs and examining the patient as well as establishing an assessment and plan that was discussed personally with the patient. > 50% of time was spent in direct patient care.  Length of Stay:  LOS: 0 days   KPixie Casino MD, FYoakum County Hospital FLocoDirector of the [facility] Lip[facility] &  Card[facility]ovascular Risk Reduction Clinic Attending Cardiologist  Direct Dial: [phone] Fax: [phone] Website:  [email or web address] 04/30/2017, 9:45 AM

## 2017-04-30 NOTE — Progress Notes (Signed)
Pt has orders to be discharged. Discharge instructions given and pt has no additional questions at this time. Medication regimen reviewed and pt educated. Pt verbalized understanding and has no additional questions. Telemetry box removed. IV removed and site in good condition. Pt stable and waiting for transportation.  [place] Marney RN 

## 2017-04-30 NOTE — Progress Notes (Signed)
Paged Card NP, Cecilie Kicks regarding pt's bp.

## 2017-04-30 NOTE — Plan of Care (Signed)
  Health Behavior/Discharge Planning: Ability to manage health-related needs will improve 04/30/2017 0002 - Progressing by Tristan Schroeder, RN   Activity: Risk for activity intolerance will decrease 04/30/2017 0002 - Progressing by Tristan Schroeder, RN

## 2017-04-30 NOTE — Discharge Summary (Signed)
Discharge Summary    Patient ID: [name],  MRN: [id number], DOB/AGE: [id number] y.o.  Admit date: 04/28/2017 Discharge date: 04/30/2017  Primary Care Provider: [facility] Primary Cardiologist: Dr. Tamala Julian  HF Team:  Dr. Haroldine Laws  Discharge Diagnoses    Principal Problem:   Acute on chronic systolic (congestive) heart failure [facility]) Active Problems:   Chest pain, due to heart failure, neg MI   History of cardiomyopathy   Allergies Allergies  Allergen Reactions  . Vancomycin Other (See Comments)    "did something to my kidneys," PROGRESSED TO KIDNEY FAILURE!!  . Contrast Media [Iodinated Diagnostic Agents] Other (See Comments)    Multiple CT contrast studies done over 2 weeks caused ARF  . Aspirin Other (See Comments)    Wheezing, "but patient still takes if she needs to"  . Ciprofloxacin Itching and Rash  . Sulfa Antibiotics Itching and Rash    Diagnostic Studies/Procedures    None this admit _____________   History of Present Illness     41 y.o. female with history of nonischemic cardiomyopathy (postpartum) with an EF of 25-30%, type 2 diabetes, who presented to ER 04/28/17 with several days of chest pain and dyspnea on exertion.  She had been doing reasonably well until 3-4 days prior to admit, when she noted worsening dyspnea and significant chest pressure and tightness upon minimal exertion.  She had barely been active over the last few days prior to presentation, only moving from her bedroom to the bathroom.  She was short of breath with this.  For the symptoms, she has been up titrating her torsemide, and is currently taking 40 mg twice daily with decent urine output.  Her weight is up approximately 3 pounds from her dry weight.  She has not had significant lower extremity edema, PND, palpitations, presyncope, or syncope.  Due to ongoing chest pressure and shortness of breath she presented to the [facility] ED for evaluation.  In ER her BP was elevated  in 160/110.  HR in low 100s.  EKG with ST with poor R wave progression unchanged from prior and troponin was negative.  She was admitted and placed on IV lasix.  Her home entresto, spironolactone and carvedilol were continued.  She has hx of patent coronary arteries.  Continued home asthma inhalers and diabetic oral agents.     Hospital Course     Consultants: none   By the next day she was negative 1.3 L.  IV lasix continued and day of discharge is negative 3 L.  Pt's Ddimer was 0.81 so CT angio of chest was done with no PE, mild cardiomegaly and pulmonary vascular congestion.  No focal consolidation.   Cr is up to 1.11 so diuretics held today -the day of discharge and will continue home diuretic 40 mg BID.  In addition her BP was lower so coreg decreased to 12.5 mg BID.   K+ is 3.9.   Day of d/c 04/30/17 pt's wt is 228 down from 233 lbs.  Pt has been seen and found stable for discharge by Dr. Debara Pickett.  She will follow up in Heart Failure clinic. _____________  Discharge Vitals Blood pressure (!) 100/46, pulse 86, temperature 98.1 F (36.7 C), temperature source Oral, resp. rate 18, height 5\' 5"  (1.651 m), weight 228 lb 9.6 oz (103.7 kg), last menstrual period 04/25/2017, SpO2 100 %.  Filed Weights   04/28/17 1738 04/29/17 0245 04/30/17 0648  Weight: 230 lb (104.3 kg) 233 lb  4.8 oz (105.8 kg) 228 lb 9.6 oz (103.7 kg)    Labs & Radiologic Studies    CBC Recent Labs    04/28/17 1741  WBC 6.7  HGB 12.9  HCT 39.6  MCV 88.6  PLT 993   Basic Metabolic Panel Recent Labs    04/29/17 0941 04/30/17 0333  NA 135 137  K 4.3 3.9  CL 102 100*  CO2 22 28  GLUCOSE 132* 107*  BUN 11 12  CREATININE 1.01* 1.11*  CALCIUM 9.3 9.3   Liver Function Tests No results for input(s): AST, ALT, ALKPHOS, BILITOT, PROT, ALBUMIN in the last 72 hours. No results for input(s): LIPASE, AMYLASE in the last 72 hours. Cardiac Enzymes Recent Labs    04/29/17 0017 04/29/17 0445  TROPONINI <0.03 <0.03     BNP Invalid input(s): POCBNP D-Dimer Recent Labs    04/29/17 0941  DDIMER 0.81*   Hemoglobin A1C No results for input(s): HGBA1C in the last 72 hours. Fasting Lipid Panel No results for input(s): CHOL, HDL, LDLCALC, TRIG, CHOLHDL, LDLDIRECT in the last 72 hours. Thyroid Function Tests No results for input(s): TSH, T4TOTAL, T3FREE, THYROIDAB in the last 72 hours.  Invalid input(s): FREET3 _____________  Dg Chest 2 View  Result Date: 04/28/2017 CLINICAL DATA:  Shortness of breath for 2-3 days. Left chest pain. History postpartum cardiomyopathy. EXAM: CHEST  2 VIEW COMPARISON:  01/17/2017 FINDINGS: AICD noted, unchanged. Mild enlargement of the cardiopericardial silhouette, cardiothoracic index 52%. Subtle scarring along the left hemidiaphragm, chronic. No pleural effusion. The lungs appear otherwise clear. Clips in the right upper quadrant likely from prior cholecystectomy. IMPRESSION: 1. Stable mild enlargement of the cardiopericardial silhouette, without edema. 2. Stable minimal scarring along the left hemidiaphragm. 3. AICD in place. Electronically Signed   By: Van Clines M.D.   On: 04/28/2017 18:13   Ct Angio Chest Pe W Or Wo Contrast  Result Date: 04/29/2017 CLINICAL DATA:  Shortness of breath, cough, chest pain. Suspect pulmonary embolism. History of diabetes, hypertension. EXAM: CT ANGIOGRAPHY CHEST WITH CONTRAST TECHNIQUE: Multidetector CT imaging of the chest was performed using the standard protocol during bolus administration of intravenous contrast. Multiplanar CT image reconstructions and MIPs were obtained to evaluate the vascular anatomy. CONTRAST:  118mL ISOVUE-370 IOPAMIDOL (ISOVUE-370) INJECTION 76% COMPARISON:  Chest radiograph April 28, 2017 and CT chest November 09, 2015 FINDINGS: CARDIOVASCULAR: Adequate contrast opacification of the pulmonary artery's. Main pulmonary artery is not enlarged. No pulmonary arterial filling defects to the level of the subsegmental  branches. Heart size is mildly enlarged, no right heart strain. No pericardial effusion. Thoracic aorta is normal course and caliber, unremarkable. Mild pulmonary vascular congestion. LEFT ICD. MEDIASTINUM/NODES: No lymphadenopathy by CT size criteria. LUNGS/PLEURA: Tracheobronchial tree is patent, no pneumothorax. Mild bronchial wall thickening. No pleural effusions, focal consolidations, pulmonary nodules or masses. UPPER ABDOMEN: Included view of the abdomen is unremarkable. MUSCULOSKELETAL: Nonacute. Mild RIGHT glenohumeral osteoarthrosis. Mild degenerative change of thoracic spine. Mild dextroscoliosis on this nonweightbearing examination. Review of the MIP images confirms the above findings. IMPRESSION: 1. No acute pulmonary embolism. 2. Mild cardiomegaly. 3. Pulmonary vascular congestion. Mild bronchial wall thickening seen with pulmonary edema, reactive airway disease or bronchitis. No focal consolidation. Electronically Signed   By: Elon Alas M.D.   On: 04/29/2017 16:21   Disposition   Pt is being discharged home today in good condition.  Follow-up Plans & Appointments  We decreased your coreg to 12.5 mg twice a day, just take half of your 25 mg  tabs.  This was due to lower BP, in office visit it may be increased back to the 25 mg twice a day.  No fluid pills today the 7th of Dec.  Resume home dose of Torsemide on the 8th of Dec.    Make sure you have enough medications for next week with bad weather predicted.  Heart Healthy diabetic very low salt diet.    Weigh daily and call if wt climbs more than 3 pounds in a day or 5 pounds in a week.  Call the heart failure clinic.    Keep your appointment with Heart failure.  They will direct you back to Dr. Tamala Julian when needed.      Follow-up Information    Biagio Borg, MD Follow up.   Specialties:  Internal Medicine, Radiology Why:  They will call you with a date and time for your hospital follow up apt Contact information: [place] [address] [phone]        Bensimhon, Shaune Pascal, MD Follow up on 05/10/2017.   Specialty:  Cardiology Why:  at 1120 AM in heart failure clinic.  call that office on the day before to be given parking insructions.   Contact information: [place] [address] [phone]            Discharge Medications   Allergies as of 04/30/2017      Reactions   Vancomycin Other (See Comments)   "did something to my kidneys," PROGRESSED TO KIDNEY FAILURE!!   Contrast Media [iodinated Diagnostic Agents] Other (See Comments)   Multiple CT contrast studies done over 2 weeks caused ARF   Aspirin Other (See Comments)   Wheezing, "but patient still takes if she needs to"   Ciprofloxacin Itching, Rash   Sulfa Antibiotics Itching, Rash      Medication List    STOP taking these medications   amoxicillin-clavulanate 875-125 MG tablet Commonly known as:  AUGMENTIN   benzonatate 100 MG capsule Commonly known as:  TESSALON   guaiFENesin 600 MG 12 hr tablet Commonly known as:  MUCINEX   promethazine-dextromethorphan 6.25-15 MG/5ML syrup Commonly known as:  PROMETHAZINE-DM     TAKE these medications   albuterol 108 (90 Base) MCG/ACT inhaler Commonly known as:  PROVENTIL HFA;VENTOLIN HFA Inhale 1-2 puffs into the lungs every 6 (six) hours as needed for wheezing or shortness of breath. **PT NEEDS TO ESTABLISH WITH ANOTHER PROVIDER FOR ADDITIONAL REFILLS** What changed:  additional instructions   Biotin 5000 MCG Tabs Take 5,000 mcg by mouth daily.   budesonide-formoterol 160-4.5 MCG/ACT inhaler Commonly known as:  SYMBICORT Inhale 2 puffs into the lungs 2 (two) times daily.   carvedilol 12.5 MG tablet Commonly known as:  COREG Take 1 tablet (12.5 mg total) by mouth 2 (two) times daily with a meal. What changed:    medication strength  how much to take   colestipol 1 g tablet Commonly known as:  COLESTID Take 1  tablet (1 g total) by mouth 2 (two) times daily.   cyclobenzaprine 10 MG tablet Commonly known as:  FLEXERIL Take 1 tablet (10 mg total) by mouth 3 (three) times daily as needed for muscle spasms.   Diclofenac Sodium 2 % Soln Apply 1 Pump topically 2 (two) times daily as needed (right shoulder and neck pain).   dicyclomine 20 MG tablet Commonly known as:  BENTYL Take 20 mg by mouth 2 (two) times daily as needed for  spasms.   fluticasone 50 MCG/ACT nasal spray Commonly known as:  FLONASE Place 2 sprays daily as needed into both nostrils for allergies.   gabapentin 100 MG capsule Commonly known as:  NEURONTIN Take 200 mg by mouth at bedtime as needed (for pain).   Ibuprofen-Famotidine 800-26.6 MG Tabs Take 1 tablet by mouth 3 (three) times daily as needed (for pain and/or swelling).   Insulin Pen Needle 32G X 6 MM Misc Commonly known as:  NOVOFINE Use to administer victoza every day   B-D ULTRAFINE III SHORT PEN 31G X 8 MM Misc Generic drug:  Insulin Pen Needle USE TO TEST BLOOD SUGARS 1-2 TIMES A DAY AS NEEDED.   ivabradine 5 MG Tabs tablet Commonly known as:  CORLANOR Take 1.5 tablets (7.5 mg total) by mouth 2 (two) times daily with a meal.   magnesium oxide 400 MG tablet Commonly known as:  MAG-OX Take 1 tablet (400 mg total) by mouth daily.   nitroGLYCERIN 0.4 MG SL tablet Commonly known as:  NITROSTAT Place 1 tablet (0.4 mg total) under the tongue every 5 (five) minutes as needed for chest pain.   NUVIGIL 250 MG tablet Generic drug:  Armodafinil Take 250 mg by mouth daily.   ondansetron 4 MG disintegrating tablet Commonly known as:  ZOFRAN ODT Take 1 tablet (4 mg total) by mouth every 8 (eight) hours as needed for nausea or vomiting.   potassium chloride SA 20 MEQ tablet Commonly known as:  K-DUR,KLOR-CON Take 2 tablets (40 mEq total) by mouth daily.   PRESCRIPTION MEDICATION CPAP: AT BEDTIME   sacubitril-valsartan 97-103 MG Commonly known as:   ENTRESTO Take 1 tablet 2 (two) times daily by mouth.   spironolactone 25 MG tablet Commonly known as:  ALDACTONE TAKE 1 TABLET BY MOUTH EVERY DAY What changed:    how much to take  how to take this  when to take this   torsemide 20 MG tablet Commonly known as:  DEMADEX Take 2 tablets (40 mg total) by mouth 2 (two) times daily. AND MAY TAKE AN ADDITIONAL DOSE OF 20 MG ONCE A DAY AS NEEDED FOR A WEIGHT GAIN OF >3-4 POUNDS IN 24 HRS What changed:    when to take this  additional instructions   venlafaxine XR 75 MG 24 hr capsule Commonly known as:  EFFEXOR-XR Take 225 mg by mouth daily with breakfast.   VICTOZA 18 MG/3ML Sopn Generic drug:  liraglutide Inject 0.3 mLs (1.8 mg total) into the skin daily.   vitamin B-12 500 MCG tablet Commonly known as:  CYANOCOBALAMIN Take 500 mcg by mouth daily.   Vitamin D (Ergocalciferol) 50000 units Caps capsule Commonly known as:  DRISDOL TAKE 1 CAPSULE (50,000 UNITS TOTAL) BY MOUTH EVERY 7 (SEVEN) DAYS. What changed:  when to take this   zolpidem 5 MG tablet Commonly known as:  AMBIEN Take 1 tablet (5 mg total) by mouth at bedtime as needed for sleep.          Outstanding Labs/Studies   BMP  Duration of Discharge Encounter   Greater than 30 minutes including physician time.  Signed, Cecilie Kicks NP 04/30/2017, 12:16 PM

## 2017-04-30 NOTE — Discharge Instructions (Signed)
We decreased your coreg to 12.5 mg twice a day, just take half of your 25 mg tabs.  This was due to lower BP, in office visit it may be increased back to the 25 mg twice a day.  No fluid pills today the 7th of Dec.  Resume home dose of Torsemide on the 8th of Dec.    Make sure you have enough medications for next week with bad weather predicted.  Heart Healthy diabetic very low salt diet.    Weigh daily and call if wt climbs more than 3 pounds in a day or 5 pounds in a week.  Call the heart failure clinic.    Keep your appointment with Heart failure.  They will direct you back to Dr. Tamala Julian when needed.

## 2017-05-07 ENCOUNTER — Ambulatory Visit (INDEPENDENT_AMBULATORY_CARE_PROVIDER_SITE_OTHER): Payer: BLUE CROSS/BLUE SHIELD | Admitting: Internal Medicine

## 2017-05-07 ENCOUNTER — Encounter: Payer: Self-pay | Admitting: Internal Medicine

## 2017-05-07 DIAGNOSIS — R071 Chest pain on breathing: Secondary | ICD-10-CM

## 2017-05-07 DIAGNOSIS — F5101 Primary insomnia: Secondary | ICD-10-CM

## 2017-05-07 DIAGNOSIS — I5042 Chronic combined systolic (congestive) and diastolic (congestive) heart failure: Secondary | ICD-10-CM | POA: Diagnosis not present

## 2017-05-07 DIAGNOSIS — G4700 Insomnia, unspecified: Secondary | ICD-10-CM | POA: Insufficient documentation

## 2017-05-07 DIAGNOSIS — E119 Type 2 diabetes mellitus without complications: Secondary | ICD-10-CM

## 2017-05-07 HISTORY — DX: Insomnia, unspecified: G47.00

## 2017-05-07 NOTE — Assessment & Plan Note (Signed)
Improved but still mild present. Symptoms not worse with activity and cath without disease previously.

## 2017-05-07 NOTE — Assessment & Plan Note (Signed)
Taking victoza for her sugars and at goal. Not complicated at this time. Reminded about yearly eye exam.

## 2017-05-07 NOTE — Patient Instructions (Signed)
We have sent in the [place] that you can take for sleep.   We will get you switched over today so you can cancel with Dr. Jenny Reichmann

## 2017-05-07 NOTE — Assessment & Plan Note (Signed)
Rx for ambien for sleep. Previously was on valium at nigh time but was on this in the hospital with good results. We talked about risk of dependence to the medication and she feels this increases QOL enough to continue therapy.

## 2017-05-07 NOTE — Assessment & Plan Note (Signed)
Getting echo Monday and cardiology visit. Continue torsemide 2 pills twice a day and spironolactone without adjustment. Weight stable since discharge and overall improved. She does have some poor QOL depending on the day. She is aware of avoidance of salt in her diet.

## 2017-05-07 NOTE — Progress Notes (Signed)
   Subjective:    Patient ID: [name], female    DOB: 1976-01-14, 41 y.o.   MRN: [id number]  HPI The patient is a 41 YO female coming in for transfer of care and hospital follow up (in for acute on chronic systolic heart failure with some medication adjustment and IV lasix, down about 5 pounds in the hospital). She was having chest pains and cough and SOB before the hospital. Some of this is gone and some is still present. Some chest soreness, minimal cough. Some SOB which is more chronic. Weighs daily at home and weight is stable since leaving the hospital. Denies fevers or chills. Denies blood in stool. Has IBS and so has some crampy pains, constipation, diarrhea intermittent which is unchanged from prior. No muscle aches. She has follow up with cardiology and echo on Monday.   PMH, [facility], social history reviewed and update.   Review of Systems  Constitutional: Positive for activity change. Negative for appetite change, chills, fatigue and fever.  HENT: Negative.   Eyes: Negative.   Respiratory: Positive for cough and shortness of breath. Negative for chest tightness.   Cardiovascular: Positive for chest pain. Negative for palpitations and leg swelling.  Gastrointestinal: Negative for abdominal distention, abdominal pain, constipation, diarrhea, nausea and vomiting.  Musculoskeletal: Negative.   Skin: Negative.   Neurological: Negative.   Psychiatric/Behavioral: Negative.       Objective:   Physical Exam  Constitutional: She is oriented to person, place, and time. She appears well-developed and well-nourished.  HENT:  Head: Normocephalic and atraumatic.  Eyes: EOM are normal.  Neck: Normal range of motion.  Cardiovascular: Normal rate and regular rhythm.  Pulmonary/Chest: Effort normal and breath sounds normal. No respiratory distress. She has no wheezes. She has no rales.  Abdominal: Soft. Bowel sounds are normal. She exhibits no distension. There is no tenderness. There is no  rebound.  Musculoskeletal: She exhibits no edema.  Neurological: She is alert and oriented to person, place, and time. Coordination normal.  Skin: Skin is warm and dry.  Psychiatric: She has a normal mood and affect.   Vitals:   05/07/17 1416  BP: (!) 150/90  Pulse: (!) 103  Temp: 98 F (36.7 C)  TempSrc: Oral  SpO2: 98%  Weight: 235 lb (106.6 kg)  Height: 5\' 5"  (1.651 m)      Assessment & Plan:

## 2017-05-09 ENCOUNTER — Encounter: Payer: Self-pay | Admitting: Internal Medicine

## 2017-05-10 ENCOUNTER — Ambulatory Visit (HOSPITAL_COMMUNITY)
Admission: RE | Admit: 2017-05-10 | Discharge: 2017-05-10 | Disposition: A | Discharge status: Home / Self Care | Payer: BLUE CROSS/BLUE SHIELD | Source: Ambulatory Visit | Attending: Internal Medicine | Admitting: Internal Medicine

## 2017-05-10 ENCOUNTER — Ambulatory Visit (HOSPITAL_COMMUNITY): Payer: BLUE CROSS/BLUE SHIELD

## 2017-05-10 ENCOUNTER — Other Ambulatory Visit: Payer: Self-pay | Admitting: Internal Medicine

## 2017-05-10 VITALS — BP 140/96 | HR 101 | Wt 242.1 lb

## 2017-05-10 DIAGNOSIS — G4733 Obstructive sleep apnea (adult) (pediatric): Secondary | ICD-10-CM | POA: Insufficient documentation

## 2017-05-10 DIAGNOSIS — F329 Major depressive disorder, single episode, unspecified: Secondary | ICD-10-CM | POA: Insufficient documentation

## 2017-05-10 DIAGNOSIS — Z955 Presence of coronary angioplasty implant and graft: Secondary | ICD-10-CM | POA: Diagnosis not present

## 2017-05-10 DIAGNOSIS — Z95810 Presence of automatic (implantable) cardiac defibrillator: Secondary | ICD-10-CM | POA: Insufficient documentation

## 2017-05-10 DIAGNOSIS — Z881 Allergy status to other antibiotic agents status: Secondary | ICD-10-CM | POA: Insufficient documentation

## 2017-05-10 DIAGNOSIS — M19011 Primary osteoarthritis, right shoulder: Secondary | ICD-10-CM | POA: Insufficient documentation

## 2017-05-10 DIAGNOSIS — I10 Essential (primary) hypertension: Secondary | ICD-10-CM | POA: Diagnosis not present

## 2017-05-10 DIAGNOSIS — J45909 Unspecified asthma, uncomplicated: Secondary | ICD-10-CM | POA: Insufficient documentation

## 2017-05-10 DIAGNOSIS — I110 Hypertensive heart disease with heart failure: Secondary | ICD-10-CM | POA: Diagnosis not present

## 2017-05-10 DIAGNOSIS — F419 Anxiety disorder, unspecified: Secondary | ICD-10-CM | POA: Diagnosis not present

## 2017-05-10 DIAGNOSIS — Z9889 Other specified postprocedural states: Secondary | ICD-10-CM | POA: Diagnosis not present

## 2017-05-10 DIAGNOSIS — Z882 Allergy status to sulfonamides status: Secondary | ICD-10-CM | POA: Diagnosis not present

## 2017-05-10 DIAGNOSIS — E119 Type 2 diabetes mellitus without complications: Secondary | ICD-10-CM | POA: Diagnosis not present

## 2017-05-10 DIAGNOSIS — I5042 Chronic combined systolic (congestive) and diastolic (congestive) heart failure: Secondary | ICD-10-CM

## 2017-05-10 DIAGNOSIS — K589 Irritable bowel syndrome without diarrhea: Secondary | ICD-10-CM | POA: Insufficient documentation

## 2017-05-10 DIAGNOSIS — I5022 Chronic systolic (congestive) heart failure: Secondary | ICD-10-CM | POA: Insufficient documentation

## 2017-05-10 DIAGNOSIS — Z79899 Other long term (current) drug therapy: Secondary | ICD-10-CM | POA: Insufficient documentation

## 2017-05-10 DIAGNOSIS — G43909 Migraine, unspecified, not intractable, without status migrainosus: Secondary | ICD-10-CM | POA: Diagnosis not present

## 2017-05-10 DIAGNOSIS — Z6841 Body Mass Index (BMI) 40.0 and over, adult: Secondary | ICD-10-CM | POA: Insufficient documentation

## 2017-05-10 DIAGNOSIS — Z886 Allergy status to analgesic agent status: Secondary | ICD-10-CM | POA: Insufficient documentation

## 2017-05-10 DIAGNOSIS — Z8261 Family history of arthritis: Secondary | ICD-10-CM | POA: Insufficient documentation

## 2017-05-10 DIAGNOSIS — Z91041 Radiographic dye allergy status: Secondary | ICD-10-CM | POA: Insufficient documentation

## 2017-05-10 DIAGNOSIS — Z9851 Tubal ligation status: Secondary | ICD-10-CM | POA: Diagnosis not present

## 2017-05-10 DIAGNOSIS — Z9049 Acquired absence of other specified parts of digestive tract: Secondary | ICD-10-CM | POA: Diagnosis not present

## 2017-05-10 DIAGNOSIS — Z836 Family history of other diseases of the respiratory system: Secondary | ICD-10-CM | POA: Diagnosis not present

## 2017-05-10 DIAGNOSIS — Z8249 Family history of ischemic heart disease and other diseases of the circulatory system: Secondary | ICD-10-CM | POA: Insufficient documentation

## 2017-05-10 MED ORDER — ZOLPIDEM TARTRATE 5 MG PO TABS: 5.0000 mg | ORAL_TABLET | Freq: Every evening | ORAL | 3 refills | Status: DC | PRN

## 2017-05-10 NOTE — Progress Notes (Signed)
CSW referred to assist with referral to the [place] program. CSW contacted [name] and referral made. Patient aware of referral made during her clinic appointment. Raquel Sarna, [place], Secretary

## 2017-05-10 NOTE — Progress Notes (Signed)
[facility] Note  Date:  05/10/2017   ID:  [name], DOB Jan 25, 1976, MRN [id number]  PCP:  Hoyt Koch, MD  Cardiologist: Pernell Dupre, MD    Subjective   [name] is a 41 y.o. 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   Cath 6/17 revealed normal vessels, moderate elevation in pulmonary artery pressures, and LVEF 25-30%. Echo 8/18 EF 25%  CPX today   pVO2 14.0 (corrects to 23.0 for IBW) VeVCO2  32 RER 1.0   RHC 6/17 RA 5 RV 35/6 PA 41/12 PW 11 Fick 6.4/3.0 PA sat 62%  Echo 6/15 35-40% Echo 6/17 25-30% Echo 12/17 20-25%  CPX 2/18  FVC 2.57 (79%)    FEV1 2.14 (81%)     FEV1/FVC 83 (101%)     MVV 107 (102%) Resting HR: 106 Peak HR: 166  (93% age predicted max HR)  BP rest: 136/98 BP peak: 174/88 Peak VO2: 17.1 (85% predicted peak VO2) - corrects to 28.4 for ibw VE/VCO2 slope: 33 OUES: 2.16  Peak RER: 1.09 Ventilatory Threshold: 14.6 (73% predicted or measured peak VO2) VE/MVV: 59% PETCO2 at peak: 33 O2pulse: 10  (83% predicted O2pulse)  48 holter in 2/18 2% PVCS  Was admitted 12/5-11/2016 with recurrent HF, URI, CP and elevated BP (160/110). CT chest no PE. Diuresed from 233-> 228 pounds.   She returns today for HF follow up. Had CPX today not much changed from previous. Has finished CR. Has not yet enrolled in maintenance program. Says she is not doing much. Just sits in the house. Says she is always thirsty. Drinks a lot juice and soda. Gets tired quickly but not as SOB. No edema, orthopnea or PND. Torsemide works well. Will take extra as needed.    Past Medical History:  Diagnosis Date  . AICD (automatic cardioverter/defibrillator) present   . Anxiety   . Arthritis    right shoulder   . Asthma   . CHF (congestive heart  failure) ([place])   . Depression   . Diabetes mellitus without complication ([place])   . Dyspnea    comes and goes intermittently mostly with exertion   . Fibroid    age 37  . Gallstones   . Hypertension   . IBS (irritable bowel syndrome)   . Migraine    "monthly" (08/03/2016)  . OSA on CPAP   . Ovarian cyst    1999; surgically removed  . 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 Seizures ([place])    "as a child" (08/03/2016)  . Termination of pregnancy    due to cardiac risk    Past Surgical History:  Procedure Laterality Date  . CARDIAC CATHETERIZATION N/A 11/11/2015   Procedure: Right/Left Heart Cath and Coronary Angiography;  Surgeon: Troy Sine, MD;  Location: [facility];  Service: Cardiovascular;  Laterality: N/A;  . CARDIAC CATHETERIZATION  ~ 2015  . CARDIAC DEFIBRILLATOR PLACEMENT  08/03/2016  . CESAREAN SECTION  1999  . COLONOSCOPY WITH PROPOFOL N/A 04/21/2016   Procedure: COLONOSCOPY WITH PROPOFOL;  Surgeon: Jerene Bears, MD;  [facility];  Service: Gastroenterology;  Laterality: N/A;  . ESOPHAGOGASTRODUODENOSCOPY (EGD) WITH PROPOFOL N/A 04/21/2016   Procedure: ESOPHAGOGASTRODUODENOSCOPY (EGD) WITH PROPOFOL;  Surgeon: Jerene Bears, MD;  Location: [facility];  Service: Gastroenterology;  Laterality: N/A;  . FOOT FRACTURE SURGERY Right ~ 2003  . FRACTURE SURGERY    . ICD IMPLANT N/A 08/03/2016   Procedure: ICD Implant;  Surgeon: Deboraha Sprang, MD;  Location: [facility];  Service: Cardiovascular;  Laterality: N/A;  . LAPAROSCOPIC CHOLECYSTECTOMY  12/2006  . LAPAROSCOPY ABDOMEN DIAGNOSTIC  2008   "cut bile duct w/gallbladder OR; had to go in later & fix leak; hospitalized for 2 months"  . LEFT HEART CATHETERIZATION WITH CORONARY ANGIOGRAM N/A 02/26/2014   Procedure: LEFT HEART CATHETERIZATION WITH CORONARY ANGIOGRAM;  Surgeon: Jettie Booze, MD;  Location: [facility] C[facility];  Service: Cardiovascular;  Laterality: N/A;  . OVARIAN  CYST REMOVAL Right 1999  . TUBAL LIGATION  1999    Current Medications:  Prior to Admission medications   Medication Sig Start Date End Date Taking? Authorizing Provider  albuterol (PROVENTIL HFA;VENTOLIN HFA) 108 (90 Base) MCG/ACT inhaler Inhale 1-2 puffs into the lungs every 6 (six) hours as needed for wheezing or shortness of breath. **PT NEEDS TO ESTABLISH WITH ANOTHER PROVIDER FOR ADDITIONAL REFILLS** Patient taking differently: Inhale 1-2 puffs into the lungs every 6 (six) hours as needed for wheezing or shortness of breath.  04/26/17  Yes Biagio Borg, MD  Armodafinil (NUVIGIL) 250 MG tablet Take 250 mg by mouth daily.   Yes [provider]  B-D ULTRAFINE III SHORT PEN 31G X 8 MM MISC USE TO TEST BLOOD SUGARS 1-2 TIMES A DAY AS NEEDED. 12/21/16  Yes Golden Circle, FNP  Biotin 5000 MCG TABS Take 5,000 mcg by mouth daily.   Yes [provider]  budesonide-formoterol (SYMBICORT) 160-4.5 MCG/ACT inhaler Inhale 2 puffs into the lungs 2 (two) times daily. 04/13/17  Yes Hulan Saas M, DO  carvedilol (COREG) 12.5 MG tablet Take 1 tablet (12.5 mg total) by mouth 2 (two) times daily with a meal. 04/30/17  Yes Isaiah Serge, NP  colestipol (COLESTID) 1 g tablet Take 1 tablet (1 g total) by mouth 2 (two) times daily. 04/30/17  Yes Isaiah Serge, NP  cyclobenzaprine (FLEXERIL) 10 MG tablet Take 1 tablet (10 mg total) by mouth 3 (three) times daily as needed for muscle spasms. 01/08/17  Yes Golden Circle, FNP  Diclofenac Sodium 2 % SOLN Apply 1 Pump topically 2 (two) times daily as needed (right shoulder and neck pain).    Yes [provider]  dicyclomine (BENTYL) 20 MG tablet Take 20 mg by mouth 2 (two) times daily as needed for spasms.   Yes [provider]  fluticasone (FLONASE) 50 MCG/ACT nasal spray Place 2 sprays daily as needed into both nostrils for allergies. 03/30/17  Yes Nche, Charlene Brooke, NP  gabapentin (NEURONTIN) 100 MG capsule Take 200 mg by  mouth at bedtime as needed (for pain).    Yes [provider]  Ibuprofen-Famotidine 800-26.6 MG TABS Take 1 tablet by mouth 3 (three) times daily as needed (for pain and/or swelling). 04/30/17  Yes Isaiah Serge, NP  Insulin Pen Needle (NOVOFINE) 32G X 6 MM MISC Use to administer victoza every day 09/21/16  Yes Golden Circle, FNP  ivabradine (CORLANOR) 5 MG TABS tablet Take 1.5 tablets (7.5 mg total) by mouth 2 (two) times daily with a meal. 07/10/16  Yes Suyash Amory, Shaune Pascal, MD  magnesium oxide (MAG-OX) 400 MG tablet Take 1 tablet (400 mg total) by mouth daily. 11/20/16  Yes Deboraha Sprang, MD  nitroGLYCERIN (NITROSTAT) 0.4 MG SL tablet Place 1 tablet (  0.4 mg total) under the tongue every 5 (five) minutes as needed for chest pain. 04/30/17  Yes Isaiah Serge, NP  ondansetron (ZOFRAN ODT) 4 MG disintegrating tablet Take 1 tablet (4 mg total) by mouth every 8 (eight) hours as needed for nausea or vomiting. 05/11/16  Yes Ward, Delice Bison, DO  potassium chloride SA (K-DUR,KLOR-CON) 20 MEQ tablet Take 2 tablets (40 mEq total) by mouth daily. 04/27/17  Yes Larey Dresser, MD  PRESCRIPTION MEDICATION CPAP: AT BEDTIME   Yes [provider]  sacubitril-valsartan (ENTRESTO) 97-103 MG Take 1 tablet 2 (two) times daily by mouth. 04/13/17  Yes Kensington Duerst, Shaune Pascal, MD  spironolactone (ALDACTONE) 25 MG tablet TAKE 1 TABLET BY MOUTH EVERY DAY Patient taking differently: Take 25 mg by mouth once a day 11/20/16  Yes Lelon Perla, MD  torsemide (DEMADEX) 20 MG tablet Take 2 tablets (40 mg total) by mouth 2 (two) times daily. AND MAY TAKE AN ADDITIONAL DOSE OF 20 MG ONCE A DAY AS NEEDED FOR A WEIGHT GAIN OF >3-4 POUNDS IN 24 HRS 04/30/17 07/29/17 Yes Isaiah Serge, NP  venlafaxine XR (EFFEXOR-XR) 75 MG 24 hr capsule Take 225 mg by mouth daily with breakfast.   Yes [provider]  VICTOZA 18 MG/3ML SOPN Inject 0.3 mLs (1.8 mg total) into the skin daily. 01/08/17  Yes Golden Circle,  FNP  vitamin B-12 (CYANOCOBALAMIN) 500 MCG tablet Take 500 mcg by mouth daily.   Yes [provider]  Vitamin D, Ergocalciferol, (DRISDOL) 50000 units CAPS capsule TAKE 1 CAPSULE (50,000 UNITS TOTAL) BY MOUTH EVERY 7 (SEVEN) DAYS. Patient taking differently: Take 50,000 Units by mouth every Wednesday.  03/22/17  Yes Lyndal Pulley, DO  zolpidem (AMBIEN) 5 MG tablet Take 1 tablet (5 mg total) by mouth at bedtime as needed for sleep. 04/30/17  Yes Isaiah Serge, NP    Allergies:   Vancomycin; Contrast media [iodinated diagnostic agents]; Aspirin; Ciprofloxacin; and Sulfa antibiotics   Social History   Socioeconomic History  . Marital status: Married    Spouse name: Not on file  . Number of children: 1  . Years of education: Not on file  . Highest education level: Not on file  Social Needs  . Financial resource strain: Not on file  . Food insecurity - worry: Not on file  . Food insecurity - inability: Not on file  . Transportation needs - medical: Not on file  . Transportation needs - non-medical: Not on file  Occupational History  . Occupation: stay at home mom  Tobacco Use  . Smoking status: Never Smoker  . Smokeless tobacco: Never Used  Substance and Sexual Activity  . Alcohol use: No  . Drug use: No  . Sexual activity: Yes  Other Topics Concern  . Not on file  Social History Narrative  . Not on file     Family History:  The patient's family history includes Allergies in her daughter; Emphysema in her maternal grandmother; Heart disease (age of onset: 13) in her maternal grandmother; Rheum arthritis in her mother.    PHYSICAL EXAM:    Vitals:   05/10/17 1059  BP: (!) 140/96  Pulse: (!) 101  SpO2: 97%  Weight: 242 lb 1.9 oz (109.8 kg)    General:  Well appearing. No resp difficulty HEENT: normal Neck: supple. no JVD. Carotids 2+ bilat; no bruits. No lymphadenopathy or thryomegaly appreciated. Cor: PMI laterally displaced. Regular rate & rhythm. No  rubs, gallops or  murmurs. Lungs: clear Abdomen: obese soft, nontender, nondistended. No hepatosplenomegaly. No bruits or masses. Good bowel sounds. Extremities: no cyanosis, clubbing, rash, edema Neuro: alert & orientedx3, cranial nerves grossly intact. moves all 4 extremities w/o difficulty. Affect pleasant   Wt Readings from Last 3 Encounters:  05/07/17 235 lb (106.6 kg)  04/30/17 228 lb 9.6 oz (103.7 kg)  03/30/17 238 lb (108 kg)      Studies/Labs Reviewed:    Recent Labs: 05/28/2016: ALT 8; TSH 1.09 04/28/2017: B Natriuretic Peptide 478.0; Hemoglobin 12.9; Platelets 247 04/30/2017: BUN 12; Creatinine, Ser 1.11; Potassium 3.9; Sodium 137   Lipid Panel    Component Value Date/Time   CHOL 160 11/09/2015 0526   TRIG 70 11/09/2015 0526   HDL 58 11/09/2015 0526   CHOLHDL 2.8 11/09/2015 0526   VLDL 14 11/09/2015 0526   LDLCALC 88 11/09/2015 0526    Additional studies/ records that were reviewed today include:  No recent laboratory data.    ASSESSMENT:    1. Chronic systolic HF:  due to peri-partum CM, onset 1999 - EF 20-25% (12/17). Echo 8/18 EF 25-30% s/p ICD 3/18 - Continues to experience NYHA III symptoms but performance at CR and CPX seems better than that - CPX testing reviewed with her today. Submax test done in setting of missing her am meds. Shows moderate limitation due mostly to obesity with some HF component - Long discussion about results. Strongly encouraged her to be more active and try to lose weight particularly by decreasing intake of juice and soda.  - Volume status looks good on torsemide 40 bid - BMET today - Continue Entresto 97/103 mg BID - Continue Coreg 12.5 mg BID (cut back during hospitalization due to low BP)  - Continue Spiro 25mg  daily.  - Continue corlanor 7.5mg  BID. HR is 69.  - ICD placed in March 2018.  2. Morbid obesity - See above. Needs diet and exercise 3. OSA -Compliant with CPAP.  4. Anxiety/depression - Follows with PCP.  5.  DM2 - A1c in Jan. 2018 is 5.3. Per PCP. Consider Jardiance.  6. HTN - BP up today in setting of missing am meds. Usually well controlled. BP recently low in hospital and carvedilol cut back.  Total time spent 45 minutes. Over half that time spent discussing above.    Glori Bickers, MD  05/10/2017 9:52 AM

## 2017-05-10 NOTE — Patient Instructions (Signed)
Your physician recommends that you schedule a follow-up appointment in: 3 months.  

## 2017-05-12 ENCOUNTER — Telehealth: Payer: Self-pay | Admitting: Internal Medicine

## 2017-05-12 NOTE — Telephone Encounter (Signed)
Pt states she would like to have a prescription for Welchol instead of colestipol, she states it works better. Please advise.

## 2017-05-12 NOTE — Telephone Encounter (Signed)
ok 

## 2017-05-13 MED ORDER — COLESEVELAM HCL 625 MG PO TABS: 625.0000 mg | ORAL_TABLET | Freq: Two times a day (BID) | ORAL | 3 refills | Status: DC

## 2017-05-13 NOTE — Telephone Encounter (Signed)
Spoke with pt and she is aware. Script sent to pharmacy. 

## 2017-05-20 ENCOUNTER — Telehealth: Payer: Self-pay | Admitting: Internal Medicine

## 2017-05-20 NOTE — Telephone Encounter (Signed)
Patient has dropped off DM[employer] parking placard off. Forms has been placed in Dr.Crawfords box to review and sign.

## 2017-05-27 ENCOUNTER — Other Ambulatory Visit: Payer: Self-pay

## 2017-05-27 NOTE — Telephone Encounter (Signed)
Form has been signed, sent to scan & original up front for pick up - LVM to inform patient.

## 2017-05-28 ENCOUNTER — Telehealth: Payer: Self-pay

## 2017-05-28 ENCOUNTER — Ambulatory Visit (INDEPENDENT_AMBULATORY_CARE_PROVIDER_SITE_OTHER): Payer: BLUE CROSS/BLUE SHIELD | Admitting: *Deleted

## 2017-05-28 DIAGNOSIS — Z95810 Presence of automatic (implantable) cardiac defibrillator: Secondary | ICD-10-CM

## 2017-05-28 DIAGNOSIS — I5022 Chronic systolic (congestive) heart failure: Secondary | ICD-10-CM | POA: Diagnosis not present

## 2017-05-28 DIAGNOSIS — I428 Other cardiomyopathies: Secondary | ICD-10-CM

## 2017-05-28 NOTE — Telephone Encounter (Signed)
Remote ICM transmission received.  Attempted call to patient and no answer   

## 2017-05-28 NOTE — Progress Notes (Signed)
Remote ICD transmission.   

## 2017-05-28 NOTE — Progress Notes (Signed)
EPIC Encounter for ICM Monitoring  Patient Name: [name] is a 42 y.o. female Date: 05/28/2017 Primary Care Physican: Hoyt Koch, MD Primary Cardiologist: Smith/Bensimhon Electrophysiologist: Faustino Congress Weight:Previous weight 226lbs        Attempted call to patient and unable to reach.  Transmission reviewed.   Hospitalized 04/28/17 to 04/30/17 due to CHF   05/27/2017 [facility] HF Index: 0    Prescribed dosage: Torsemide 20 mg take 2 tablets (40 mg) by mouth twice daily, take an extra tablet (20 mg) daily as needed for weight gain of >3-4 lbs in 24 hours Potassium 20 mEq 2tablets (40 mEq total) daily.  Labs: 04/30/2017 Creatinine 1.11, BUN 12, Potassium 3.9, Sodium 137, EGFR >60  04/29/2017 Creatinine 1.01, BUN 11, Potassium 4.3, Sodium 135, EGFR >60  04/28/2017 Creatinine 0.78, BUN <5, Potassium 4.1, Sodium 137, EGFR >60  02/03/2017 Creatinine 0.84, BUN 5, Potassium 3.3, Sodium 135, EGFR >60 01/17/2017 Creatinine 0.76, BUN >5, Potassium 3.2, Sodium 135, EGFR >60 01/07/2017 Creatinine 0.86, BUN 8, Potassium 3.4, Sodium 139, EGFR >60 10/07/2016 Creatinine 0.88, BUN 7, Potassium 4.1, Sodium 137, EGFR >60 07/30/2016 Creatinine 0.95, BUN 13, Potassium 3.8, Sodium 138, EGFR 74-85  06/24/2016 Creatinine 0.93, BUN 8, Potassium 3.5, Sodium 138, EGFR >60  06/22/2016 Creatinine 0.77, BUN 6, Potassium 3.9, Sodium 141, EGFR 96-110  05/28/2016 Creatinine 0.81, BUN 7, Potassium 3.4, Sodium 139  Recommendations:. NONE - Unable to reach.  Follow-up plan: ICM clinic phone appointment on 06/29/2017.          Copy of ICM check sent to Dr. Caryl Comes.  3 Month Trend    8 Day Data Trend           [name], RN 05/28/2017 1:50 PM

## 2017-05-31 ENCOUNTER — Encounter: Payer: Self-pay | Admitting: Cardiology

## 2017-06-02 DIAGNOSIS — I509 Heart failure, unspecified: Secondary | ICD-10-CM | POA: Diagnosis not present

## 2017-06-02 DIAGNOSIS — Z8639 Personal history of other endocrine, nutritional and metabolic disease: Secondary | ICD-10-CM | POA: Insufficient documentation

## 2017-06-02 DIAGNOSIS — Z79899 Other long term (current) drug therapy: Secondary | ICD-10-CM | POA: Diagnosis not present

## 2017-06-02 DIAGNOSIS — E119 Type 2 diabetes mellitus without complications: Secondary | ICD-10-CM | POA: Insufficient documentation

## 2017-06-02 DIAGNOSIS — Z6839 Body mass index (BMI) 39.0-39.9, adult: Secondary | ICD-10-CM | POA: Diagnosis not present

## 2017-06-02 DIAGNOSIS — E669 Obesity, unspecified: Secondary | ICD-10-CM | POA: Insufficient documentation

## 2017-06-02 DIAGNOSIS — I429 Cardiomyopathy, unspecified: Secondary | ICD-10-CM | POA: Diagnosis not present

## 2017-06-07 NOTE — Progress Notes (Signed)
[facility] Report   Patient Details  Name: [name] MRN: [id number] Date of Birth: 04/29/1976 Age: 42 y.o. PCP: Hoyt Koch, MD  Vitals:   06/07/17 1411  BP: (!) 132/102  Pulse: 94  Resp: 18  SpO2: 98%  Weight: 239 lb 3.2 oz (108.5 kg)  Height: 5\' 4"  (1.626 m)     [facility] Eval - 06/07/17 1400      Referral    Referring Provider  dr. Haroldine Laws    Reason for referral  Family History;Heart Failure;Hypertension;Inactivity;Obesitity/Overweight;Orthopedic      Measurement   Waist Circumference  42 inches    Hip Circumference  50 inches    Body fat  45.6 percent      Information for Trainer   Goals  lose weight, get stronger, build muscle, get healthy    Current Exercise  none    Orthopedic Concerns  RT shoulder, Bilat hips, RT knee    Pertinent Medical History  CHF,     Current Barriers  none    Medications that affect exercise  Beta blocker;Asthma inhaler      Mobility and Daily Activities   I find it easy to walk up or down two or more flights of stairs.  1    I have no trouble taking out the trash.  2    I do housework such as vacuuming and dusting on my own without difficulty.  2    I can easily lift a gallon of milk (8lbs).  3    I can easily walk a mile.  1    I have no trouble reaching into high cupboards or reaching down to pick up something from the floor.  2    I do not have trouble doing out-door work such as Armed forces logistics/support/administrative officer, raking leaves, or gardening.  1      Mobility and Daily Activities   I feel younger than my age.  1    I feel independent.  1    I feel energetic.  1    I live an active life.   1    I feel strong.  1    I feel healthy.  1    I feel active as other people my age.  1      How fit and strong are you.   Fit and Strong Total Score  19      Past Medical History:  Diagnosis Date  . AICD (automatic cardioverter/defibrillator) present   . Anxiety   . Arthritis    right shoulder   . Asthma   . CHF  (congestive heart failure) ([place])   . Depression   . Diabetes mellitus without complication ([place])   . Dyspnea    comes and goes intermittently mostly with exertion   . Fibroid    age 41  . Gallstones   . Hypertension   . IBS (irritable bowel syndrome)   . Migraine    "monthly" (08/03/2016)  . OSA on CPAP   . Ovarian cyst    1999; surgically removed  . 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 Seizures ([place])    "as a child" (08/03/2016)  . Termination of pregnancy    due to cardiac risk   Past Surgical History:  Procedure Laterality Date  . CARDIAC CATHETERIZATION N/A 11/11/2015   Procedure: Right/Left Heart Cath and Coronary Angiography;  Surgeon: Troy Sine, MD;  Location: [place] [facility];  Service: Cardiovascular;  Laterality: N/A;  . CARDIAC CATHETERIZATION  ~ 2015  . CARDIAC DEFIBRILLATOR PLACEMENT  08/03/2016  . CESAREAN SECTION  1999  . COLONOSCOPY WITH PROPOFOL N/A 04/21/2016   Procedure: COLONOSCOPY WITH PROPOFOL;  Surgeon: Jerene Bears, MD;  [facility];  Service: Gastroenterology;  Laterality: N/A;  . ESOPHAGOGASTRODUODENOSCOPY (EGD) WITH PROPOFOL N/A 04/21/2016   Procedure: ESOPHAGOGASTRODUODENOSCOPY (EGD) WITH PROPOFOL;  Surgeon: Jerene Bears, MD;  [facility];  Service: Gastroenterology;  Laterality: N/A;  . FOOT FRACTURE SURGERY Right ~ 2003  . FRACTURE SURGERY    . ICD IMPLANT N/A 08/03/2016   Procedure: ICD Implant;  Surgeon: Deboraha Sprang, MD;  Location: [place] [facility];  Service: Cardiovascular;  Laterality: N/A;  . LAPAROSCOPIC CHOLECYSTECTOMY  12/2006  . LAPAROSCOPY ABDOMEN DIAGNOSTIC  2008   "cut bile duct w/gallbladder OR; had to go in later & fix leak; hospitalized for 2 months"  . LEFT HEART CATHETERIZATION WITH CORONARY ANGIOGRAM N/A 02/26/2014   Procedure: LEFT HEART CATHETERIZATION WITH CORONARY ANGIOGRAM;  Surgeon: Jettie Booze, MD;  Location: [facility];  Service: Cardiovascular;  Laterality:  N/A;  . OVARIAN CYST REMOVAL Right 1999  . TUBAL LIGATION  1999   Social History   Tobacco Use  Smoking Status Never Smoker  Smokeless Tobacco Never Used     [name] is ready to make lifestyle change and to incorporate regular exercise into her life.  She is going to come to weekly education class on Wednesdays and will start exercising with a trainer within the next week.    [name] 06/07/2017, 2:14 PM

## 2017-06-08 LAB — CUP PACEART REMOTE DEVICE CHECK
Battery Remaining Longevity: 180 mo
Battery Remaining Percentage: 100 %
Brady Statistic RV Percent Paced: 0 %
Date Time Interrogation Session: 20190104095100
HighPow Impedance: 66 Ohm
Implantable Lead Implant Date: 20180312
Implantable Lead Location: 753860
Implantable Lead Model: 293
Implantable Lead Serial Number: 422842
Implantable Pulse Generator Implant Date: 20180312
Lead Channel Impedance Value: 600 Ohm
Lead Channel Setting Pacing Amplitude: 2.5 V
Lead Channel Setting Pacing Pulse Width: 0.4 ms
Lead Channel Setting Sensing Sensitivity: 0.5 mV
Pulse Gen Serial Number: 226601

## 2017-06-11 ENCOUNTER — Encounter: Payer: Self-pay | Admitting: Internal Medicine

## 2017-06-11 ENCOUNTER — Other Ambulatory Visit (INDEPENDENT_AMBULATORY_CARE_PROVIDER_SITE_OTHER): Payer: BLUE CROSS/BLUE SHIELD

## 2017-06-11 ENCOUNTER — Ambulatory Visit (INDEPENDENT_AMBULATORY_CARE_PROVIDER_SITE_OTHER): Payer: BLUE CROSS/BLUE SHIELD | Admitting: Internal Medicine

## 2017-06-11 VITALS — BP 126/70 | HR 97 | Temp 98.0°F | Ht 64.0 in | Wt 232.0 lb

## 2017-06-11 DIAGNOSIS — E119 Type 2 diabetes mellitus without complications: Secondary | ICD-10-CM

## 2017-06-11 DIAGNOSIS — I10 Essential (primary) hypertension: Secondary | ICD-10-CM

## 2017-06-11 DIAGNOSIS — K30 Functional dyspepsia: Secondary | ICD-10-CM | POA: Diagnosis not present

## 2017-06-11 LAB — BASIC METABOLIC PANEL
BUN: 23 mg/dL (ref 6–23)
CO2: 32 mEq/L (ref 19–32)
Calcium: 9.9 mg/dL (ref 8.4–10.5)
Chloride: 96 mEq/L (ref 96–112)
Creatinine, Ser: 1.11 mg/dL (ref 0.40–1.20)
GFR: 68.93 mL/min (ref >60.00)
Glucose, Bld: 89 mg/dL (ref 70–99)
Potassium: 4.1 mEq/L (ref 3.5–5.1)
Sodium: 136 mEq/L (ref 135–145)

## 2017-06-11 LAB — MAGNESIUM: Magnesium: 2.3 mg/dL (ref 1.5–2.5)

## 2017-06-11 MED ORDER — BLOOD GLUCOSE METER KIT: PACK | 0 refills | Status: DC

## 2017-06-11 NOTE — Progress Notes (Signed)
   Subjective:    Patient ID: [name], female    DOB: 04-Jul-1975, 42 y.o.   MRN: [id number]  HPI The patient is a 42 YO female coming in for concerns about going on the keto diet. She has noticed that her blood sugars are going down sometimes. She wants a meter to monitor her sugars. She denies lightheadedness or dizziness. She is also getting more muscle cramps. She is seeing a weight loss specialist who is monitoring the keto diet. She wants to know if she should change her potassium dosage.   Review of Systems  Constitutional: Positive for activity change and appetite change.  Respiratory: Negative for cough, chest tightness and shortness of breath.   Cardiovascular: Negative for chest pain, palpitations and leg swelling.  Gastrointestinal: Negative for abdominal distention, abdominal pain, constipation, diarrhea, nausea and vomiting.  Musculoskeletal: Positive for myalgias.  Skin: Negative.   Neurological: Negative.   Psychiatric/Behavioral: Negative.       Objective:   Physical Exam  Constitutional: She is oriented to person, place, and time. She appears well-developed and well-nourished.  Overweight  HENT:  Head: Normocephalic and atraumatic.  Eyes: EOM are normal.  Neck: Normal range of motion.  Cardiovascular: Normal rate and regular rhythm.  Pulmonary/Chest: Effort normal and breath sounds normal. No respiratory distress. She has no wheezes. She has no rales.  Abdominal: Soft. Bowel sounds are normal. She exhibits no distension. There is no tenderness. There is no rebound.  Musculoskeletal: She exhibits no edema.  Neurological: She is alert and oriented to person, place, and time. Coordination normal.  Skin: Skin is warm and dry.   Vitals:   06/11/17 1500  BP: 126/70  Pulse: 97  Temp: 98 F (36.7 C)  TempSrc: Oral  SpO2: 99%  Weight: 232 lb (105.2 kg)  Height: 5\' 4"  (1.626 m)      Assessment & Plan:

## 2017-06-11 NOTE — Patient Instructions (Addendum)
The blood sugar should be around 60-120 when you are fasting. It should not be more than 220 about 30 minutes after eating.   We have sent in the meter to check the sugars.

## 2017-06-11 NOTE — Assessment & Plan Note (Signed)
Checking BMP and magnesium and adjust dosing as needed. BP is at goal but given her radical change in diet she may need adjustment. Currently taking coreg, entresto, spironolactone.

## 2017-06-14 ENCOUNTER — Encounter: Payer: Self-pay | Admitting: Internal Medicine

## 2017-06-14 NOTE — Progress Notes (Unsigned)
[facility] PREP Weekly Session   Patient Details  Name: [name] MRN: [id number] Date of Birth: 02/05/1976 Age: 42 y.o. PCP: Hoyt Koch, MD  Vitals:   06/09/17 1146  Weight: 230 lb 9.6 oz (104.6 kg)    [facility] Weekly seesion - 06/14/17 1100      Weekly Session   Topic Discussed  Importance of resistance training    Minutes exercised this week  32 minutes "cardio7/strength15/flexibility10"   "cardio7/strength15/flexibility10"   Classes attended to date  1     [facility] [place] Progress Report    Patient Details  Name: [name] MRN: [id number] Date of Birth: 09/20/1975 Age: 42 y.o. PCP: Hoyt Koch, MD   Vitals:   06/09/17 1146  Weight: 230 lb 9.6 oz (104.6 kg)     [facility] Eval - 06/07/17 1400      Referral    Referring Provider  dr. Haroldine Laws    Reason for referral  Family History;Heart Failure;Hypertension;Inactivity;Obesitity/Overweight;Orthopedic      Measurement   Waist Circumference  42 inches    Hip Circumference  50 inches    Body fat  45.6 percent      Information for Trainer   Goals  lose weight, get stronger, build muscle, get healthy    Current Exercise  none    Orthopedic Concerns  RT shoulder, Bilat hips, RT knee    Pertinent Medical History  CHF,     Current Barriers  none    Medications that affect exercise  Beta blocker;Asthma inhaler      Mobility and Daily Activities   I find it easy to walk up or down two or more flights of stairs.  1    I have no trouble taking out the trash.  2    I do housework such as vacuuming and dusting on my own without difficulty.  2    I can easily lift a gallon of milk (8lbs).  3    I can easily walk a mile.  1    I have no trouble reaching into high cupboards or reaching down to pick up something from the floor.  2    I do not have trouble doing out-door work such as Armed forces logistics/support/administrative officer, raking leaves, or gardening.  1      Mobility and Daily Activities   I feel  younger than my age.  1    I feel independent.  1    I feel energetic.  1    I live an active life.   1    I feel strong.  1    I feel healthy.  1    I feel active as other people my age.  1      How fit and strong are you.   Fit and Strong Total Score  19      Past Medical History:  Diagnosis Date  . AICD (automatic cardioverter/defibrillator) present   . Anxiety   . Arthritis    right shoulder   . Asthma   . CHF (congestive heart failure) ([place])   . Depression   . Diabetes mellitus without complication ([place])   . Dyspnea    comes and goes intermittently mostly with exertion   . Fibroid    age 35  . Gallstones   . Hypertension   . IBS (irritable bowel syndrome)   . Migraine    "monthly" (08/03/2016)  . OSA on CPAP   .  Ovarian cyst    1999; surgically removed  . 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 Seizures ([place])    "as a child" (08/03/2016)  . Termination of pregnancy    due to cardiac risk   Past Surgical History:  Procedure Laterality Date  . CARDIAC CATHETERIZATION N/A 11/11/2015   Procedure: Right/Left Heart Cath and Coronary Angiography;  Surgeon: Troy Sine, MD;  Location: [place] [facility];  Service: Cardiovascular;  Laterality: N/A;  . CARDIAC CATHETERIZATION  ~ 2015  . CARDIAC DEFIBRILLATOR PLACEMENT  08/03/2016  . CESAREAN SECTION  1999  . COLONOSCOPY WITH PROPOFOL N/A 04/21/2016   Procedure: COLONOSCOPY WITH PROPOFOL;  Surgeon: Jerene Bears, MD;  [facility];  Service: Gastroenterology;  Laterality: N/A;  . ESOPHAGOGASTRODUODENOSCOPY (EGD) WITH PROPOFOL N/A 04/21/2016   Procedure: ESOPHAGOGASTRODUODENOSCOPY (EGD) WITH PROPOFOL;  Surgeon: Jerene Bears, MD;  [facility];  Service: Gastroenterology;  Laterality: N/A;  . FOOT FRACTURE SURGERY Right ~ 2003  . FRACTURE SURGERY    . ICD IMPLANT N/A 08/03/2016   Procedure: ICD Implant;  Surgeon: Deboraha Sprang, MD;  Location: [place] [facility];  Service:  Cardiovascular;  Laterality: N/A;  . LAPAROSCOPIC CHOLECYSTECTOMY  12/2006  . LAPAROSCOPY ABDOMEN DIAGNOSTIC  2008   "cut bile duct w/gallbladder OR; had to go in later & fix leak; hospitalized for 2 months"  . LEFT HEART CATHETERIZATION WITH CORONARY ANGIOGRAM N/A 02/26/2014   Procedure: LEFT HEART CATHETERIZATION WITH CORONARY ANGIOGRAM;  Surgeon: Jettie Booze, MD;  Location: [facility] C[facility];  Service: Cardiovascular;  Laterality: N/A;  . OVARIAN CYST REMOVAL Right 1999  . TUBAL LIGATION  1999   Social History   Tobacco Use  Smoking Status Never Smoker  Smokeless Tobacco Never Used     *** Wellness sessions attended.  Comments ***    Latissa's preferred day for training is {Training Preferred ELFY:101751025} and preferred time is {Training Preferred Time of Day:210910201}   [name] 06/14/2017, 11:50 AM [place] Report   Patient Details  Name: [name] MRN: [id number] Date of Birth: 1975/10/24 Age: 42 y.o. PCP: Hoyt Koch, MD  Vitals:   06/09/17 1146  Weight: 230 lb 9.6 oz (104.6 kg)     [facility] Eval - 06/07/17 1400      Referral    Referring Provider  dr. Haroldine Laws    Reason for referral  Family History;Heart Failure;Hypertension;Inactivity;Obesitity/Overweight;Orthopedic      Measurement   Waist Circumference  42 inches    Hip Circumference  50 inches    Body fat  45.6 percent      Information for Trainer   Goals  lose weight, get stronger, build muscle, get healthy    Current Exercise  none    Orthopedic Concerns  RT shoulder, Bilat hips, RT knee    Pertinent Medical History  CHF,     Current Barriers  none    Medications that affect exercise  Beta blocker;Asthma inhaler      Mobility and Daily Activities   I find it easy to walk up or down two or more flights of stairs.  1    I have no trouble taking out the trash.  2    I do housework such as vacuuming and dusting on my own without difficulty.  2    I  can easily lift a gallon of milk (8lbs).  3    I can easily walk a mile.  1    I have no trouble reaching into high cupboards or reaching down to pick up something from the floor.  2    I do not have trouble doing out-door work such as Armed forces logistics/support/administrative officer, raking leaves, or gardening.  1      Mobility and Daily Activities   I feel younger than my age.  1    I feel independent.  1    I feel energetic.  1    I live an active life.   1    I feel strong.  1    I feel healthy.  1    I feel active as other people my age.  1      How fit and strong are you.   Fit and Strong Total Score  19      Past Medical History:  Diagnosis Date  . AICD (automatic cardioverter/defibrillator) present   . Anxiety   . Arthritis    right shoulder   . Asthma   . CHF (congestive heart failure) ([place])   . Depression   . Diabetes mellitus without complication ([place])   . Dyspnea    comes and goes intermittently mostly with exertion   . Fibroid    age 68  . Gallstones   . Hypertension   . IBS (irritable bowel syndrome)   . Migraine    "monthly" (08/03/2016)  . OSA on CPAP   . Ovarian cyst    1999; surgically removed  . 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 Seizures ([place])    "as a child" (08/03/2016)  . Termination of pregnancy    due to cardiac risk   Past Surgical History:  Procedure Laterality Date  . CARDIAC CATHETERIZATION N/A 11/11/2015   Procedure: Right/Left Heart Cath and Coronary Angiography;  Surgeon: Troy Sine, MD;  Location: [place] [facility];  Service: Cardiovascular;  Laterality: N/A;  . CARDIAC CATHETERIZATION  ~ 2015  . CARDIAC DEFIBRILLATOR PLACEMENT  08/03/2016  . CESAREAN SECTION  1999  . COLONOSCOPY WITH PROPOFOL N/A 04/21/2016   Procedure: COLONOSCOPY WITH PROPOFOL;  Surgeon: Jerene Bears, MD;  [facility];  Service: Gastroenterology;  Laterality: N/A;  . ESOPHAGOGASTRODUODENOSCOPY (EGD) WITH PROPOFOL N/A 04/21/2016   Procedure:  ESOPHAGOGASTRODUODENOSCOPY (EGD) WITH PROPOFOL;  Surgeon: Jerene Bears, MD;  [facility];  Service: Gastroenterology;  Laterality: N/A;  . FOOT FRACTURE SURGERY Right ~ 2003  . FRACTURE SURGERY    . ICD IMPLANT N/A 08/03/2016   Procedure: ICD Implant;  Surgeon: Deboraha Sprang, MD;  Location: [place] [facility];  Service: Cardiovascular;  Laterality: N/A;  . LAPAROSCOPIC CHOLECYSTECTOMY  12/2006  . LAPAROSCOPY ABDOMEN DIAGNOSTIC  2008   "cut bile duct w/gallbladder OR; had to go in later & fix leak; hospitalized for 2 months"  . LEFT HEART CATHETERIZATION WITH CORONARY ANGIOGRAM N/A 02/26/2014   Procedure: LEFT HEART CATHETERIZATION WITH CORONARY ANGIOGRAM;  Surgeon: Jettie Booze, MD;  Location: [facility] CATH LAB;  Service: Cardiovascular;  Laterality: N/A;  . OVARIAN CYST REMOVAL Right 1999  . TUBAL LIGATION  1999   Social History   Tobacco Use  Smoking Status Never Smoker  Smokeless Tobacco Never Used     Things you are grateful for:"Waking up and good weather" Nutrition celebrations:"No stomach aches.  Eating 3 meals a day" Barriers:"Very tired earlier and earlier"    [name] 06/14/2017, 11:48 AM

## 2017-06-15 ENCOUNTER — Ambulatory Visit (INDEPENDENT_AMBULATORY_CARE_PROVIDER_SITE_OTHER): Payer: Medicare Other | Admitting: Family Medicine

## 2017-06-15 ENCOUNTER — Ambulatory Visit: Payer: Medicare Other | Admitting: Family Medicine

## 2017-06-15 ENCOUNTER — Other Ambulatory Visit: Payer: Self-pay

## 2017-06-15 ENCOUNTER — Encounter: Payer: Self-pay | Admitting: Family Medicine

## 2017-06-15 VITALS — BP 142/86 | HR 68 | Temp 98.1°F | Ht 64.0 in | Wt 237.0 lb

## 2017-06-15 DIAGNOSIS — M7918 Myalgia, other site: Secondary | ICD-10-CM

## 2017-06-15 DIAGNOSIS — M25511 Pain in right shoulder: Secondary | ICD-10-CM | POA: Diagnosis not present

## 2017-06-15 MED ORDER — BLOOD GLUCOSE METER KIT: PACK | 0 refills | Status: DC

## 2017-06-15 NOTE — Progress Notes (Signed)
[name] - 42 y.o. female MRN [id number]  Date of birth: [birth year]  SUBJECTIVE:  Including CC & ROS.  Chief Complaint  Patient presents with  . Right Shoulder spasm    [name] is a 42 y.o. female that is presenting with right shoulder pain. Pain has been increasing over the past two weeks. The pain is located near her right shoulder and radiates to her trapezius. She has been taking Duexis and Gabapentin with no improvement in her pain. Denies injury to the area. She has been seen by Dr. Tamala Julian previously for trigger point injections and manipulation. She has worked on her posture but has not tried formal physical therapy. Pain is worse at the end of the day and when she is lying in bed at night. Pain is moderate to severe in nature. No recent injury or prior surgery.      Review of Systems  Constitutional: Negative for fever.  HENT: Negative for sinus pain.   Respiratory: Negative for cough.   Cardiovascular: Negative for chest pain.  Gastrointestinal: Negative for abdominal pain.  Genitourinary: Negative for dysuria.  Musculoskeletal: Positive for back pain. Negative for gait problem.  Skin: Negative for color change.  Neurological: Negative for weakness.  Hematological: Negative for adenopathy.  Psychiatric/Behavioral: Negative for agitation.    HISTORY: Past Medical, Surgical, Social, and Family History Reviewed & Updated per EMR.   Pertinent Historical Findings include:  Past Medical History:  Diagnosis Date  . AICD (automatic cardioverter/defibrillator) present   . Anxiety   . Arthritis    right shoulder   . Asthma   . CHF (congestive heart failure) ([place])   . Depression   . Diabetes mellitus without complication ([place])   . Dyspnea    comes and goes intermittently mostly with exertion   . Fibroid    age 42  . Gallstones   . Hypertension   . IBS (irritable bowel syndrome)   . Migraine    "monthly" (08/03/2016)  . OSA on CPAP   . Ovarian cyst    1999;  surgically removed  . 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 Seizures ([place])    "as a child" (08/03/2016)  . Termination of pregnancy    due to cardiac risk    Past Surgical History:  Procedure Laterality Date  . CARDIAC CATHETERIZATION N/A 11/11/2015   Procedure: Right/Left Heart Cath and Coronary Angiography;  Surgeon: Troy Sine, MD;  Location: [place] [facility];  Service: Cardiovascular;  Laterality: N/A;  . CARDIAC CATHETERIZATION  ~ 2015  . CARDIAC DEFIBRILLATOR PLACEMENT  08/03/2016  . CESAREAN SECTION  1999  . COLONOSCOPY WITH PROPOFOL N/A 04/21/2016   Procedure: COLONOSCOPY WITH PROPOFOL;  Surgeon: Jerene Bears, MD;  [facility];  Service: Gastroenterology;  Laterality: N/A;  . ESOPHAGOGASTRODUODENOSCOPY (EGD) WITH PROPOFOL N/A 04/21/2016   Procedure: ESOPHAGOGASTRODUODENOSCOPY (EGD) WITH PROPOFOL;  Surgeon: Jerene Bears, MD;  [facility];  Service: Gastroenterology;  Laterality: N/A;  . FOOT FRACTURE SURGERY Right ~ 2003  . FRACTURE SURGERY    . ICD IMPLANT N/A 08/03/2016   Procedure: ICD Implant;  Surgeon: Deboraha Sprang, MD;  Location: [place] [facility];  Service: Cardiovascular;  Laterality: N/A;  . LAPAROSCOPIC CHOLECYSTECTOMY  12/2006  . LAPAROSCOPY ABDOMEN DIAGNOSTIC  2008   "cut bile duct w/gallbladder OR; had to go in later & fix leak; hospitalized for 2 months"  . LEFT HEART CATHETERIZATION WITH CORONARY ANGIOGRAM N/A 02/26/2014  Procedure: LEFT HEART CATHETERIZATION WITH CORONARY ANGIOGRAM;  Surgeon: Jettie Booze, MD;  Location[facility] C[facility];  Service: Cardiovascular;  Laterality: N/A;  . OVARIAN CYST REMOVAL Right 1999  . TUBAL LIGATION  1999    Allergies  Allergen Reactions  . Vancomycin Other (See Comments)    "did something to my kidneys," PROGRESSED TO KIDNEY FAILURE!!  . Contrast Media [Iodinated Diagnostic Agents] Other (See Comments)    Multiple CT contrast studies done over 2 weeks caused ARF  .  Aspirin Other (See Comments)    Wheezing, "but patient still takes if she needs to"  . Ciprofloxacin Itching and Rash  . Sulfa Antibiotics Itching and Rash    Family History  Problem Relation Age of Onset  . Emphysema Maternal Grandmother        smoked  . Heart disease Maternal Grandmother 66       MI  . Rheum arthritis Mother   . Allergies Daughter   . Colon cancer Neg Hx      Social History   Socioeconomic History  . Marital status: Married    Spouse name: Not on file  . Number of children: 1  . Years of education: Not on file  . Highest education level: Not on file  Social Needs  . Financial resource strain: Not on file  . Food insecurity - worry: Not on file  . Food insecurity - inability: Not on file  . Transportation needs - medical: Not on file  . Transportation needs - non-medical: Not on file  Occupational History  . Occupation: stay at home mom  Tobacco Use  . Smoking status: Never Smoker  . Smokeless tobacco: Never Used  Substance and Sexual Activity  . Alcohol use: No  . Drug use: No  . Sexual activity: Yes  Other Topics Concern  . Not on file  Social History Narrative  . Not on file     PHYSICAL EXAM:  VS: BP (!) 142/86 (BP Location: Left Arm, Patient Position: Sitting, Cuff Size: Normal)   Pulse 68   Temp 98.1 F (36.7 C) (Oral)   Ht 5\' 4"  (1.626 m)   Wt 237 lb (107.5 kg)   SpO2 100%   BMI 40.68 kg/m  Physical Exam Gen: NAD, alert, cooperative with exam, well-appearing ENT: normal lips, normal nasal mucosa,  Eye: normal EOM, normal conjunctiva and lids CV:  no edema, +2 pedal pulses   Resp: no accessory muscle use, non-labored,  Skin: no rashes, no areas of induration  Neuro: normal tone, normal sensation to touch Psych:  normal insight, alert and oriented MSK:  Neck:  Normal neck ROM  TTP of the right trapezius  TTP of th rhomboids  Right shoulder:  Normal active ROM  No scapular winging Normal IR and ER strength to resistance    Normal ER  Pain with Hawkin's testing  Neurovascularly intact   Limited ultrasound: right shoulder:  Normal BT  Normal subscapularis in static and in dynamic  Normal appearing supraspinatus with possible small subacromial bursa   Summary: possible small subacromial bursitis otherwise normal.   Ultrasound and interpretation by Clearance Coots, MD          Aspiration/Injection Procedure Note [name] [birth year]  Procedure: Injection Indications: right trapezius pain and right rhomboid pain   Procedure Details Consent: Risks of procedure as well as the alternatives and risks of each were explained to the (patient/caregiver).  Consent for procedure obtained. Time Out: Verified patient identification, verified procedure,  site/side was marked, verified correct patient position, special equipment/implants available, medications/allergies/relevent history reviewed, required imaging and test results available.  Performed.  The area was cleaned with iodine and alcohol swabs.    The right trapezius and rhomboid was injected using 1 cc's of 40 mg Depomedrol and 4 cc's of 1% lidocaine with a 25 1 1/2" needle.  Ultrasound was used. Images were obtained in Long views showing the injection.    A sterile dressing was applied.  Patient did tolerate procedure well.            ASSESSMENT & PLAN:   Myofascial pain Pain in the trapezius and rhomboid. Unable to take NSAIDS with her history of HF. Has tried Pennsaid.  - trigger point injection x 2  - counseled on HEP  - can consider PT if no improvement or manipulation   Acute pain of right shoulder Pain is likely related to the myofascial pain around the scapula. [place] was reassuring  - counseled on HEP  - could consider subacromial injection if no improvement in her pain.

## 2017-06-15 NOTE — Patient Instructions (Signed)
Please try Aspercreme with lidocaine  to the area

## 2017-06-16 DIAGNOSIS — Z0279 Encounter for issue of other medical certificate: Secondary | ICD-10-CM

## 2017-06-16 DIAGNOSIS — M7918 Myalgia, other site: Secondary | ICD-10-CM | POA: Insufficient documentation

## 2017-06-16 DIAGNOSIS — M25511 Pain in right shoulder: Secondary | ICD-10-CM

## 2017-06-16 HISTORY — DX: Pain in right shoulder: M25.511

## 2017-06-16 HISTORY — DX: Myalgia, other site: M79.18

## 2017-06-16 NOTE — Assessment & Plan Note (Signed)
Pain is likely related to the myofascial pain around the scapula. [place] was reassuring  - counseled on HEP  - could consider subacromial injection if no improvement in her pain.

## 2017-06-16 NOTE — Assessment & Plan Note (Signed)
Pain in the trapezius and rhomboid. Unable to take NSAIDS with her history of HF. Has tried Pennsaid.  - trigger point injection x 2  - counseled on HEP  - can consider PT if no improvement or manipulation

## 2017-06-17 ENCOUNTER — Telehealth: Payer: Self-pay | Admitting: Internal Medicine

## 2017-06-17 NOTE — Telephone Encounter (Signed)
Discharged application: Total and permanent disability forms have been completed & signed by MD, Copy sent to scan, Original mailed to patient &charged for.

## 2017-06-18 ENCOUNTER — Other Ambulatory Visit: Payer: Self-pay | Admitting: Family Medicine

## 2017-06-21 ENCOUNTER — Other Ambulatory Visit: Payer: Self-pay

## 2017-06-21 DIAGNOSIS — O903 Peripartum cardiomyopathy: Secondary | ICD-10-CM

## 2017-06-21 NOTE — Telephone Encounter (Signed)
This is a CHF pt 

## 2017-06-22 ENCOUNTER — Ambulatory Visit: Payer: Medicare Other | Admitting: Internal Medicine

## 2017-06-23 NOTE — Progress Notes (Signed)
[facility] PREP Weekly Session   Patient Details  Name: [name] MRN: [id number] Date of Birth: 07-17-1975 Age: 42 y.o. PCP: Hoyt Koch, MD  Vitals:   06/23/17 1310  Weight: 226 lb 12.8 oz (102.9 kg)    [facility] Weekly seesion - 06/23/17 1300      Weekly Session   Topic Discussed  Other Portion control   Portion control   Minutes exercised this week  960 minutes 480cardio/120strength/326flexibility   480cardio/120strength/334flexibility   Classes attended to date  3      Fun things you did since last meeting:"hung out w/my daughter & her friends" Things you are grateful for:"Life. 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  Getting my evening meds in"  [name] 06/23/2017, 1:11 PM

## 2017-06-23 NOTE — Progress Notes (Signed)
[facility] PREP Weekly Session   Patient Details  Name: [name] MRN: [id number] Date of Birth: February 12, 1976 Age: 42 y.o. PCP: Hoyt Koch, MD  Vitals:   06/16/17 0919  Weight: 230 lb (104.3 kg)    [facility] Weekly seesion - 06/23/17 0900      Weekly Session   Topic Discussed  Finding support    Minutes exercised this week  840 minutes 540cardio/240strength/51flexibility   540cardio/240strength/64flexibility   Classes attended to date  2      Fun things you did since last meeting:"Bought a new truck" Things you are grateful for:"No stomach aches.  Decent health.  Better sleep." Nutrition celebrations:"No soda or juice.  Lots of water daily" Barriers:"Eating 3 x's & eating snacks regularly when not hungry.  Rt shoulder pain is severe"   [name] 06/23/2017, 9:20 AM

## 2017-06-28 ENCOUNTER — Encounter: Payer: Self-pay | Admitting: *Deleted

## 2017-06-29 ENCOUNTER — Telehealth: Payer: Self-pay | Admitting: Cardiology

## 2017-06-29 DIAGNOSIS — K3184 Gastroparesis: Secondary | ICD-10-CM | POA: Diagnosis not present

## 2017-06-29 DIAGNOSIS — E1143 Type 2 diabetes mellitus with diabetic autonomic (poly)neuropathy: Secondary | ICD-10-CM | POA: Diagnosis not present

## 2017-06-29 NOTE — Telephone Encounter (Signed)
Spoke with pt and reminded pt of remote transmission that is due today. Pt verbalized understanding.   

## 2017-07-01 DIAGNOSIS — F329 Major depressive disorder, single episode, unspecified: Secondary | ICD-10-CM | POA: Diagnosis not present

## 2017-07-01 DIAGNOSIS — F54 Psychological and behavioral factors associated with disorders or diseases classified elsewhere: Secondary | ICD-10-CM | POA: Diagnosis not present

## 2017-07-01 NOTE — Progress Notes (Signed)
No ICM remote transmission received for 06/29/2017 and next ICM transmission scheduled for 07/19/2017.

## 2017-07-05 ENCOUNTER — Ambulatory Visit (INDEPENDENT_AMBULATORY_CARE_PROVIDER_SITE_OTHER): Payer: Medicare Other | Admitting: Internal Medicine

## 2017-07-05 ENCOUNTER — Encounter: Payer: Self-pay | Admitting: Internal Medicine

## 2017-07-05 VITALS — BP 138/100 | HR 100 | Ht 64.0 in | Wt 235.4 lb

## 2017-07-05 DIAGNOSIS — R198 Other specified symptoms and signs involving the digestive system and abdomen: Secondary | ICD-10-CM

## 2017-07-05 DIAGNOSIS — K9089 Other intestinal malabsorption: Secondary | ICD-10-CM

## 2017-07-05 DIAGNOSIS — R195 Other fecal abnormalities: Secondary | ICD-10-CM

## 2017-07-05 DIAGNOSIS — K580 Irritable bowel syndrome with diarrhea: Secondary | ICD-10-CM

## 2017-07-05 MED ORDER — COLESTIPOL HCL 1 G PO TABS: 2.0000 g | ORAL_TABLET | Freq: Two times a day (BID) | ORAL | 2 refills | Status: DC

## 2017-07-05 MED ORDER — RIFAXIMIN 550 MG PO TABS: 550.0000 mg | ORAL_TABLET | Freq: Three times a day (TID) | ORAL | 0 refills | Status: DC

## 2017-07-05 NOTE — Patient Instructions (Addendum)
We have sent the following medications to your pharmacy for you to pick up at your convenience: Xifaxan 550 mg three times daily  Colestipol 2 grams twice daily (in place of welchol)  Discontinue Welchol.  Please follow up with Dr Hilarie Fredrickson Tuesday, 10/19/17 at 1:45 pm.  If you are age 44 or older, your body mass index should be between 23-30. Your Body mass index is 40.4 kg/m. If this is out of the aforementioned range listed, please consider follow up with your Primary Care Provider.  If you are age 59 or younger, your body mass index should be between 19-25. Your Body mass index is 40.4 kg/m. If this is out of the aformentioned range listed, please consider follow up with your Primary Care Provider.

## 2017-07-05 NOTE — Progress Notes (Signed)
Subjective:    Patient ID: [name], female    DOB: 1976-03-05, 42 y.o.   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  She was last seen about 1 year ago to discuss her chronic diarrhea.  She presents alone today.  At the time of her last visit we switched her WelChol to colestipol because WelChol had caused constipation when used at 625 mg twice daily.  Colestipol was prescribed at 2 g/day and she states that this helped but was not quite as good as the WelChol.  2 months ago she called to switch back to [name] and she has been using this twice daily but having to skip doses due to becoming constipated.  She reports that eating causes her to have a urgent loose stools.  At times she will have burning lower abdominal discomfort and also borborygmi.  On advice from a friend she went 2 weeks with a no carbohydrate ketogenic diet.  During this time she did lose several pounds but also her bowel habits normalized.  She had no diarrhea and was not using bile acid sequestrant.  She then slowly added back regular food including carbohydrates and has had return of her loose watery stools associated with eating.  No upper GI or hepatobiliary complaint.  No recent episodes of heart failure.  She follows with Dr. Haroldine Laws.  She is on diuretic therapy.   Review of Systems As per HPI, otherwise negative  Current Medications, Allergies, Past Medical History, Past Surgical History, Family History and Social History were reviewed in [employer] record.     Objective:   Physical Exam BP (!) 138/100   Pulse 100   Ht 5\' 4"  (1.626 m)   Wt 235 lb 6 oz (106.8 kg)   BMI 40.40 kg/m  Gen: awake, alert, NAD HEENT: anicteric, op  clear CV: RRR, no mrg Pulm: CTA b/l Abd: soft, NT/ND, +BS throughout Ext: no c/c/e Neuro: nonfocal   EGD -- mildly striped erythematous mucosa in the gastric antrum which was biopsied. Otherwise normal exam. Gastric biopsies benign stomach showing chronic inactive gastritis. No H. Pylori. Duodenal biopsies normal. Colonoscopy -- to the terminal ileum. Normal TI. Few small mouth diverticula in the left colon. Otherwise normal colon. Random biopsies normal. 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   1.  Chronic diarrhea/bile acid diarrhea/IBS with borborygmi --symptoms definitively improved with colestipol and WelChol.  WelChol when used twice daily causes constipation.  We discussed how colestipol dose could be titrated as she was on low-dose therapy at 2 g daily.  She seems to have dietary triggers given lack of loose stools with ketogenic diet.  We discussed possibly an elimination diet.  I also feel that she likely has a component of IBS.  For this reason I recommended rifaximin 550 mg 3 times daily times 14 days.  We will switch back to colestipol and dose titrate given constipation with WelChol.  Resume colestipol 2 g in the morning followed by a second 2  g dose in the evening.  I would like her to complete rifaximin therapy before resuming colestipol.  I will see her back in 3-4 months, sooner if needed  25 minutes spent with the patient today. Greater than 50% was spent in counseling and coordination of care with the patient

## 2017-07-09 MED ORDER — SPIRONOLACTONE 25 MG PO TABS: 25.0000 mg | ORAL_TABLET | Freq: Every day | ORAL | 8 refills | Status: DC

## 2017-07-09 NOTE — Progress Notes (Signed)
[facility] PREP Weekly Session   Patient Details  Name: [name] MRN: [id number] Date of Birth: 05-Nov-1975 Age: 42 y.o. PCP: Hoyt Koch, MD  There were no vitals filed for this visit.  [facility] Weekly seesion - 07/09/17 0900      Weekly Session   Topic Discussed  Healthy eating tips    Minutes exercised this week  180 minutes cardio20/strength60/flexibility60   cardio20/strength60/flexibility60   Classes attended to date  4      Fun things you did since last meeting:"Had the flu" Things you are grateful for:"Surviving the flu" Nutrition celebrations:"Found out I am gluten intolerant" Barriers:"The flu and stomach issues"   [name] 07/09/2017, 9:21 AM

## 2017-07-12 DIAGNOSIS — I10 Essential (primary) hypertension: Secondary | ICD-10-CM | POA: Diagnosis not present

## 2017-07-12 DIAGNOSIS — E119 Type 2 diabetes mellitus without complications: Secondary | ICD-10-CM | POA: Diagnosis not present

## 2017-07-12 DIAGNOSIS — G4733 Obstructive sleep apnea (adult) (pediatric): Secondary | ICD-10-CM | POA: Diagnosis not present

## 2017-07-12 DIAGNOSIS — E669 Obesity, unspecified: Secondary | ICD-10-CM | POA: Diagnosis not present

## 2017-07-12 DIAGNOSIS — Z9884 Bariatric surgery status: Secondary | ICD-10-CM | POA: Diagnosis not present

## 2017-07-12 DIAGNOSIS — Z01810 Encounter for preprocedural cardiovascular examination: Secondary | ICD-10-CM | POA: Diagnosis not present

## 2017-07-12 DIAGNOSIS — Z9989 Dependence on other enabling machines and devices: Secondary | ICD-10-CM | POA: Diagnosis not present

## 2017-07-12 DIAGNOSIS — Z950 Presence of cardiac pacemaker: Secondary | ICD-10-CM | POA: Diagnosis not present

## 2017-07-12 DIAGNOSIS — Z9114 Patient's other noncompliance with medication regimen: Secondary | ICD-10-CM | POA: Diagnosis not present

## 2017-07-12 DIAGNOSIS — Z01818 Encounter for other preprocedural examination: Secondary | ICD-10-CM | POA: Diagnosis not present

## 2017-07-12 DIAGNOSIS — Z8759 Personal history of other complications of pregnancy, childbirth and the puerperium: Secondary | ICD-10-CM | POA: Diagnosis not present

## 2017-07-19 ENCOUNTER — Ambulatory Visit (INDEPENDENT_AMBULATORY_CARE_PROVIDER_SITE_OTHER): Payer: BLUE CROSS/BLUE SHIELD

## 2017-07-19 ENCOUNTER — Other Ambulatory Visit (HOSPITAL_COMMUNITY): Payer: Self-pay | Admitting: Internal Medicine

## 2017-07-19 DIAGNOSIS — I5022 Chronic systolic (congestive) heart failure: Secondary | ICD-10-CM | POA: Diagnosis not present

## 2017-07-19 DIAGNOSIS — R948 Abnormal results of function studies of other organs and systems: Secondary | ICD-10-CM | POA: Diagnosis not present

## 2017-07-19 DIAGNOSIS — Z6839 Body mass index (BMI) 39.0-39.9, adult: Secondary | ICD-10-CM | POA: Diagnosis not present

## 2017-07-19 DIAGNOSIS — E669 Obesity, unspecified: Secondary | ICD-10-CM | POA: Diagnosis not present

## 2017-07-19 DIAGNOSIS — Z95810 Presence of automatic (implantable) cardiac defibrillator: Secondary | ICD-10-CM

## 2017-07-19 NOTE — Progress Notes (Signed)
EPIC Encounter for ICM Monitoring  Patient Name: [name] is a 42 y.o. female Date: 07/19/2017 Primary Care Physican: Hoyt Koch, MD Primary Cardiologist: Smith/Bensimhon Electrophysiologist: Faustino Congress Weight:235lbs                                                                         Spoke with patient.  She denied any fluid accumulation symptoms.  Advised to monitor and if any symptoms develop to take the extra Torsemide she has prescribed.  She prefers to try and get the fluid levels balanced before taking extra Torsemide.  She said she is eating more Keto type of diet with a lot of meat.  She has been eating bacon.  Advised to check food labels for salt amount.    07/18/2017 Heartlogic HF Index: 26 which is greater than threshold.     Prescribed dosage: Torsemide 20 mg take 2 tablets (40 mg) by mouth twice daily, take an extra tablet (20 mg) daily as needed for weight gain of >3-4 lbs in 24 hours Potassium 20 mEq 2tablets (40 mEq total) daily.  Labs: 06/11/2017 Creatinine 1.11, BUN 23, Potassium 4.1, Sodium 136, EGFR 68.93 04/30/2017 Creatinine 1.11, BUN 12, Potassium 3.9, Sodium 137, EGFR >60  04/29/2017 Creatinine 1.01, BUN 11, Potassium 4.3, Sodium 135, EGFR >60  04/28/2017 Creatinine 0.78, BUN <5, Potassium 4.1, Sodium 137, EGFR >60  02/03/2017 Creatinine 0.84, BUN 5, Potassium 3.3, Sodium 135, EGFR >60 01/17/2017 Creatinine 0.76, BUN >5, Potassium 3.2, Sodium 135, EGFR >60 01/07/2017 Creatinine 0.86, BUN 8, Potassium 3.4, Sodium 139, EGFR >60 10/07/2016 Creatinine 0.88, BUN 7, Potassium 4.1, Sodium 137, EGFR >60 07/30/2016 Creatinine 0.95, BUN 13, Potassium 3.8, Sodium 138, EGFR 74-85  06/24/2016 Creatinine 0.93, BUN 8, Potassium 3.5, Sodium 138, EGFR >60  06/22/2016 Creatinine 0.77, BUN 6, Potassium 3.9, Sodium 141, EGFR 96-110  05/28/2016 Creatinine 0.81, BUN 7, Potassium 3.4, Sodium 139  Recommendations: Advised to limit salt intake to  2000 mg/day and fluid intake to < 2 liters/day. She stated she drinks about 2 liters a day.  Encouraged to call for fluid symptoms.  Follow-up plan: ICM clinic phone appointment on 07/23/2017.  Office appointment scheduled 08/17/2017 with Dr. Haroldine Laws.        Copy of ICM check sent to Dr. Haroldine Laws and Dr. Caryl Comes.  3 Month Trend      8 Day Data Trend            [name], RN 07/19/2017 8:58 AM

## 2017-07-21 DIAGNOSIS — I110 Hypertensive heart disease with heart failure: Secondary | ICD-10-CM | POA: Diagnosis not present

## 2017-07-21 DIAGNOSIS — Z95810 Presence of automatic (implantable) cardiac defibrillator: Secondary | ICD-10-CM | POA: Diagnosis not present

## 2017-07-21 DIAGNOSIS — I428 Other cardiomyopathies: Secondary | ICD-10-CM | POA: Diagnosis not present

## 2017-07-21 DIAGNOSIS — E119 Type 2 diabetes mellitus without complications: Secondary | ICD-10-CM | POA: Diagnosis not present

## 2017-07-21 DIAGNOSIS — G4733 Obstructive sleep apnea (adult) (pediatric): Secondary | ICD-10-CM | POA: Diagnosis not present

## 2017-07-21 DIAGNOSIS — I5042 Chronic combined systolic (congestive) and diastolic (congestive) heart failure: Secondary | ICD-10-CM | POA: Diagnosis not present

## 2017-07-21 DIAGNOSIS — Z9989 Dependence on other enabling machines and devices: Secondary | ICD-10-CM | POA: Diagnosis not present

## 2017-07-21 LAB — TSH: TSH: 1.82 (ref 0.41–5.90)

## 2017-07-21 LAB — CBC AND DIFFERENTIAL
HCT: 43 (ref 36–46)
Hemoglobin: 14.1 (ref 12.0–16.0)
Platelets: 202 (ref 150–399)
WBC: 7.6

## 2017-07-21 LAB — BASIC METABOLIC PANEL
BUN: 16 (ref 4–21)
Creatinine: 0.9 (ref 0.5–1.1)
Potassium: 4.1 (ref 3.4–5.3)
Sodium: 137 (ref 137–147)

## 2017-07-22 ENCOUNTER — Encounter (HOSPITAL_COMMUNITY): Payer: Self-pay | Admitting: Internal Medicine

## 2017-07-23 ENCOUNTER — Ambulatory Visit (INDEPENDENT_AMBULATORY_CARE_PROVIDER_SITE_OTHER): Payer: Self-pay

## 2017-07-23 ENCOUNTER — Telehealth: Payer: Self-pay | Admitting: Cardiology

## 2017-07-23 DIAGNOSIS — Z95810 Presence of automatic (implantable) cardiac defibrillator: Secondary | ICD-10-CM

## 2017-07-23 DIAGNOSIS — I5022 Chronic systolic (congestive) heart failure: Secondary | ICD-10-CM

## 2017-07-23 NOTE — Progress Notes (Signed)
EPIC Encounter for ICM Monitoring  Patient Name: [name] is a 42 y.o. female Date: 07/23/2017 Primary Care Physican: Hoyt Koch, MD Primary Cardiologist: Smith/Bensimhon Electrophysiologist: Faustino Congress Weight:232lbs       Heart Failure questions reviewed, pt asymptomatic.   07/22/2017 Heartlogic HF Index: 18 which is greater than threshold.  HF index has decreased from 26 to 18 after taking extra Torsemide    Prescribed dosage: Torsemide 20 mg take 2 tablets (40 mg) by mouth twice daily, take an extra tablet (20 mg) daily as needed for weight gain of >3-4 lbs in 24 hours Potassium 20 mEq 2tablets (40 mEq total) daily.  Labs: 06/11/2017 Creatinine 1.11, BUN 23, Potassium 4.1, Sodium 136, EGFR 68.93 04/30/2017 Creatinine1.11, BUN12, Potassium3.9, Sodium137, EGFR>60  04/29/2017 Creatinine1.01, BUN11, Potassium4.3, Sodium135, EGFR>60  04/28/2017 Creatinine0.78, BUN<5, Potassium4.1, Sodium137, EGFR>60 02/03/2017 Creatinine 0.84, BUN 5, Potassium 3.3, Sodium 135, EGFR >60 01/17/2017 Creatinine 0.76, BUN >5, Potassium 3.2, Sodium 135, EGFR >60 01/07/2017 Creatinine 0.86, BUN 8, Potassium 3.4, Sodium 139, EGFR >60 10/07/2016 Creatinine 0.88, BUN 7, Potassium 4.1, Sodium 137, EGFR >60 07/30/2016 Creatinine 0.95, BUN 13, Potassium 3.8, Sodium 138, EGFR 74-85  06/24/2016 Creatinine 0.93, BUN 8, Potassium 3.5, Sodium 138, EGFR >60  06/22/2016 Creatinine 0.77, BUN 6, Potassium 3.9, Sodium 141, EGFR 96-110  05/28/2016 Creatinine 0.81, BUN 7, Potassium 3.4, Sodium 139  Recommendations: No changes.   Encouraged to call for fluid symptoms.  Follow-up plan: ICM clinic phone appointment on 08/30/2017.  Office appointment scheduled 08/17/2017 with Dr. Haroldine Laws.        Copy of ICM check sent to Dr. Haroldine Laws and Dr. Caryl Comes.  3 Month Trend     8 Day Data Trend            [name], RN 07/23/2017 1:45 PM

## 2017-07-23 NOTE — Telephone Encounter (Signed)
Spoke with pt and reminded pt of remote transmission that is due today. Pt verbalized understanding.   

## 2017-07-27 ENCOUNTER — Encounter: Payer: Self-pay | Admitting: Internal Medicine

## 2017-07-30 ENCOUNTER — Encounter (HOSPITAL_COMMUNITY): Payer: Self-pay | Admitting: Internal Medicine

## 2017-07-30 ENCOUNTER — Other Ambulatory Visit (HOSPITAL_COMMUNITY): Payer: Self-pay | Admitting: *Deleted

## 2017-07-30 MED ORDER — IVABRADINE HCL 7.5 MG PO TABS: 7.5000 mg | ORAL_TABLET | Freq: Two times a day (BID) | ORAL | 6 refills | Status: DC

## 2017-08-03 ENCOUNTER — Other Ambulatory Visit (HOSPITAL_COMMUNITY): Payer: Self-pay | Admitting: *Deleted

## 2017-08-03 ENCOUNTER — Telehealth (HOSPITAL_COMMUNITY): Payer: Self-pay | Admitting: *Deleted

## 2017-08-03 ENCOUNTER — Encounter: Payer: Self-pay | Admitting: Internal Medicine

## 2017-08-03 DIAGNOSIS — I5022 Chronic systolic (congestive) heart failure: Secondary | ICD-10-CM

## 2017-08-03 NOTE — Telephone Encounter (Signed)
Per Dr Haroldine Laws pt needs RHC prior to bariatric surgery.  [facility] sch for 3/14 at 8:30 w/Dr Bensimhon,  Pt aware to arrive at 6:30 and all instructions reviewed with her via phone.  Pt also ask about her Corlanor,s he states even with the copay assit card it is $188.62 a month.  I spoke w/pharmacy and they state that is after insurance and copay card, called [employer] [phone]) they state med is approved and covered however she has a deductible and $348 is applied to it.  Pt is aware once deductible is met cost will come down.  Samples left at front desk for pt, she states she will be able to get med and then hopefully cost will come down next month,s he will call back if she need further assistance.   Medication Samples have been provided to the patient.  Drug name: Corlanor       Strength: 59m        Qty: 2   LOT:: [id number] Exp.Date: 7/21  Dosing instructions: Take 1.5 tabs Twice daily   The patient has been instructed regarding the correct time, dose, and frequency of taking this medication, including desired effects and most common side effects.   Enzio Buchler 11:19 AM 08/03/2017

## 2017-08-05 ENCOUNTER — Encounter (HOSPITAL_COMMUNITY)
Admission: RE | Disposition: A | Discharge status: Home / Self Care | Payer: Self-pay | Source: Ambulatory Visit | Attending: Internal Medicine

## 2017-08-05 ENCOUNTER — Encounter (HOSPITAL_COMMUNITY): Payer: Self-pay | Admitting: Internal Medicine

## 2017-08-05 ENCOUNTER — Ambulatory Visit (HOSPITAL_COMMUNITY)
Admission: RE | Admit: 2017-08-05 | Discharge: 2017-08-05 | Disposition: A | Discharge status: Home / Self Care | Payer: BLUE CROSS/BLUE SHIELD | Source: Ambulatory Visit | Attending: Internal Medicine | Admitting: Internal Medicine

## 2017-08-05 DIAGNOSIS — Z95810 Presence of automatic (implantable) cardiac defibrillator: Secondary | ICD-10-CM | POA: Diagnosis not present

## 2017-08-05 DIAGNOSIS — Z91041 Radiographic dye allergy status: Secondary | ICD-10-CM | POA: Diagnosis not present

## 2017-08-05 DIAGNOSIS — G43909 Migraine, unspecified, not intractable, without status migrainosus: Secondary | ICD-10-CM | POA: Insufficient documentation

## 2017-08-05 DIAGNOSIS — I5042 Chronic combined systolic (congestive) and diastolic (congestive) heart failure: Secondary | ICD-10-CM | POA: Diagnosis not present

## 2017-08-05 DIAGNOSIS — Z7951 Long term (current) use of inhaled steroids: Secondary | ICD-10-CM | POA: Diagnosis not present

## 2017-08-05 DIAGNOSIS — Z794 Long term (current) use of insulin: Secondary | ICD-10-CM | POA: Diagnosis not present

## 2017-08-05 DIAGNOSIS — G4733 Obstructive sleep apnea (adult) (pediatric): Secondary | ICD-10-CM | POA: Diagnosis not present

## 2017-08-05 DIAGNOSIS — Z6837 Body mass index (BMI) 37.0-37.9, adult: Secondary | ICD-10-CM | POA: Insufficient documentation

## 2017-08-05 DIAGNOSIS — I110 Hypertensive heart disease with heart failure: Secondary | ICD-10-CM | POA: Insufficient documentation

## 2017-08-05 DIAGNOSIS — Z882 Allergy status to sulfonamides status: Secondary | ICD-10-CM | POA: Diagnosis not present

## 2017-08-05 DIAGNOSIS — F419 Anxiety disorder, unspecified: Secondary | ICD-10-CM | POA: Diagnosis not present

## 2017-08-05 DIAGNOSIS — K589 Irritable bowel syndrome without diarrhea: Secondary | ICD-10-CM | POA: Diagnosis not present

## 2017-08-05 DIAGNOSIS — M19011 Primary osteoarthritis, right shoulder: Secondary | ICD-10-CM | POA: Insufficient documentation

## 2017-08-05 DIAGNOSIS — J45909 Unspecified asthma, uncomplicated: Secondary | ICD-10-CM | POA: Diagnosis not present

## 2017-08-05 DIAGNOSIS — F329 Major depressive disorder, single episode, unspecified: Secondary | ICD-10-CM | POA: Diagnosis not present

## 2017-08-05 DIAGNOSIS — E119 Type 2 diabetes mellitus without complications: Secondary | ICD-10-CM | POA: Diagnosis not present

## 2017-08-05 DIAGNOSIS — I5022 Chronic systolic (congestive) heart failure: Secondary | ICD-10-CM

## 2017-08-05 HISTORY — PX: RIGHT HEART CATH: CATH118263

## 2017-08-05 LAB — POCT I-STAT 3, VENOUS BLOOD GAS (G3P V)
Acid-Base Excess: 1 mmol/L (ref 0.0–2.0)
Acid-base deficit: 2 mmol/L (ref 0.0–2.0)
Acid-base deficit: 2 mmol/L (ref 0.0–2.0)
Bicarbonate: 23 mmol/L (ref 20.0–28.0)
Bicarbonate: 24.2 mmol/L (ref 20.0–28.0)
Bicarbonate: 27 mmol/L (ref 20.0–28.0)
O2 Saturation: 65 %
O2 Saturation: 71 %
O2 Saturation: 72 %
TCO2: 24 mmol/L (ref 22–32)
TCO2: 26 mmol/L (ref 22–32)
TCO2: 28 mmol/L (ref 22–32)
pCO2, Ven: 39.8 mmHg — ABNORMAL LOW (ref 44.0–60.0)
pCO2, Ven: 44.3 mmHg (ref 44.0–60.0)
pCO2, Ven: 47.6 mmHg (ref 44.0–60.0)
pH, Ven: 7.346 (ref 7.250–7.430)
pH, Ven: 7.362 (ref 7.250–7.430)
pH, Ven: 7.371 (ref 7.250–7.430)
pO2, Ven: 36 mmHg (ref 32.0–45.0)
pO2, Ven: 39 mmHg (ref 32.0–45.0)
pO2, Ven: 39 mmHg (ref 32.0–45.0)

## 2017-08-05 LAB — BASIC METABOLIC PANEL
Anion gap: 11 (ref 5–15)
BUN: 17 mg/dL (ref 6–20)
CO2: 26 mmol/L (ref 22–32)
Calcium: 9.2 mg/dL (ref 8.9–10.3)
Chloride: 98 mmol/L — ABNORMAL LOW (ref 101–111)
Creatinine, Ser: 1.1 mg/dL — ABNORMAL HIGH (ref 0.44–1.00)
GFR calc Af Amer: >60 mL/min (ref >60)
GFR calc non Af Amer: >60 mL/min (ref >60)
Glucose, Bld: 114 mg/dL — ABNORMAL HIGH (ref 65–99)
Potassium: 3.5 mmol/L (ref 3.5–5.1)
Sodium: 135 mmol/L (ref 135–145)

## 2017-08-05 LAB — CBC
HCT: 39.3 % (ref 36.0–46.0)
Hemoglobin: 13.1 g/dL (ref 12.0–15.0)
MCH: 29.6 pg (ref 26.0–34.0)
MCHC: 33.3 g/dL (ref 30.0–36.0)
MCV: 88.7 fL (ref 78.0–100.0)
Platelets: 220 10*3/uL (ref 150–400)
RBC: 4.43 MIL/uL (ref 3.87–5.11)
RDW: 13.7 % (ref 11.5–15.5)
WBC: 6.1 10*3/uL (ref 4.0–10.5)

## 2017-08-05 LAB — PROTIME-INR
INR: 1.04
Prothrombin Time: 13.5 seconds (ref 11.4–15.2)

## 2017-08-05 LAB — GLUCOSE, CAPILLARY: Glucose-Capillary: 114 mg/dL — ABNORMAL HIGH (ref 65–99)

## 2017-08-05 SURGERY — RIGHT HEART CATH
Anesthesia: LOCAL

## 2017-08-05 MED ORDER — ASPIRIN 81 MG PO CHEW
81.0000 mg | CHEWABLE_TABLET | ORAL | Status: AC
  Administered 2017-08-05: 81 mg via ORAL

## 2017-08-05 MED ORDER — SODIUM CHLORIDE 0.9% FLUSH: 3.0000 mL | INTRAVENOUS | Status: DC | PRN

## 2017-08-05 MED ORDER — SODIUM CHLORIDE 0.9 % IV SOLN
INTRAVENOUS | Status: DC
  Administered 2017-08-05: 08:00:00 via INTRAVENOUS

## 2017-08-05 MED ORDER — MIDAZOLAM HCL 2 MG/2ML IJ SOLN
INTRAMUSCULAR | Status: AC
  Filled 2017-08-05: qty 2

## 2017-08-05 MED ORDER — HEPARIN (PORCINE) IN NACL 2-0.9 UNIT/ML-% IJ SOLN
INTRAMUSCULAR | Status: AC | PRN
  Administered 2017-08-05: 500 mL

## 2017-08-05 MED ORDER — SODIUM CHLORIDE 0.9% FLUSH: 3.0000 mL | Freq: Two times a day (BID) | INTRAVENOUS | Status: DC

## 2017-08-05 MED ORDER — ASPIRIN 81 MG PO CHEW
CHEWABLE_TABLET | ORAL | Status: AC
  Administered 2017-08-05: 81 mg via ORAL
  Filled 2017-08-05: qty 1

## 2017-08-05 MED ORDER — SODIUM CHLORIDE 0.9 % IV SOLN: 250.0000 mL | INTRAVENOUS | Status: DC | PRN

## 2017-08-05 MED ORDER — MIDAZOLAM HCL 2 MG/2ML IJ SOLN
INTRAMUSCULAR | Status: DC | PRN
  Administered 2017-08-05: 1 mg via INTRAVENOUS

## 2017-08-05 MED ORDER — LIDOCAINE HCL (PF) 1 % IJ SOLN
INTRAMUSCULAR | Status: DC | PRN
  Administered 2017-08-05: 5 mL

## 2017-08-05 MED ORDER — FENTANYL CITRATE (PF) 100 MCG/2ML IJ SOLN
INTRAMUSCULAR | Status: AC
  Filled 2017-08-05: qty 2

## 2017-08-05 MED ORDER — FENTANYL CITRATE (PF) 100 MCG/2ML IJ SOLN
INTRAMUSCULAR | Status: DC | PRN
  Administered 2017-08-05: 25 ug via INTRAVENOUS

## 2017-08-05 SURGICAL SUPPLY — 5 items
CATH BALLN WEDGE 5F 110CM (CATHETERS) ×2 IMPLANT
PACK CARDIAC CATHETERIZATION (CUSTOM PROCEDURE TRAY) ×2 IMPLANT
SHEATH GLIDE SLENDER 4/5FR (SHEATH) ×2 IMPLANT
TRANSDUCER W/STOPCOCK (MISCELLANEOUS) ×2 IMPLANT
TUBING CIL FLEX 10 FLL-RA (TUBING) ×2 IMPLANT

## 2017-08-05 NOTE — H&P (Signed)
[facility] H&P  Date:  08/05/2017   ID:  [name], DOB [birth year], MRN [id number]  PCP:  Hoyt Koch, MD  Cardiologist: Pernell Dupre, MD    Subjective   [name] is a 42 y.o. 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   Cath 6/17 revealed normal vessels, moderate elevation in pulmonary artery pressures, and LVEF 25-30%. Echo 8/18 EF 25%  CPX 12/18  pVO2 14.0 (corrects to 23.0 for IBW) VeVCO2  32 RER 1.0  RHC 6/17 RA 5 RV 35/6 PA 41/12 PW 11 Fick 6.4/3.0 PA sat 62%  Recently scheduled to undergo bariatric surgery at [facility]. Seen by Cardiology at [facility] and recommendation was for [place] prior to surgery as part of pre-op evaluation. Remains stable. Still SOB with mild to moderate exertion. Occasional edema. No syncope or CP.  Review of Systems: [y] = yes, [ ]  = no    General: Weight gain [] ; Weight loss [ ] ; Anorexia [ ] ; Fatigue [ ] ; Fever [ ] ; Chills [ ] ; Weakness [ ]    Cardiac: Chest pain/pressure [ ] ; Resting SOB [ y]; Exertional SOB [y]; Orthopnea [ ] ; Pedal Edema [] ; Palpitations [ ] ; Syncope [ ] ; Presyncope [ ] ; Paroxysmal nocturnal dyspnea[ ]    Pulmonary: Cough [ ] ; Wheezing[ ] ; Hemoptysis[ ] ; Sputum [ ] ; Snoring [ ]    GI: Vomiting[ ] ; Dysphagia[ ] ; Melena[ ] ; Hematochezia [ ] ; Heartburn[ ] ; Abdominal pain [ ] ; Constipation [ ] ; Diarrhea [ ] ; BRBPR [ ]    GU: Hematuria[ ] ; Dysuria [ ] ; Nocturia[ ]   Vascular: Pain in legs with walking [ ] ; Pain in feet with lying flat [ ] ; Non-healing sores [ ] ; Stroke [ ] ; TIA [ ] ; Slurred speech [ ] ;   Neuro: Headaches[ ] ; Vertigo[ ] ; Seizures[ ] ; Paresthesias[ ] ;Blurred vision [ ] ; Diplopia [ ] ; Vision changes [ ]    Ortho/Skin: Arthritis [y]; Joint pain [y]; Muscle pain [ ] ; Joint swelling [ ] ; Back Pain [ ] ;  Rash [ ]    Psych: Depression[ y]; Anxiety[y]   Heme: Bleeding problems [ ] ; Clotting disorders [ ] ; Anemia [ ]    Endocrine: Diabetes [ ] ; Thyroid dysfunction[ ]     Past Medical History:  Diagnosis Date  . AICD (automatic cardioverter/defibrillator) present   . Anxiety   . Arthritis    right shoulder   . Asthma   . CHF (congestive heart failure) ([place])   . Depression   . Diabetes mellitus without complication ([place])   . Diverticulosis   . Dyspnea    comes and goes intermittently mostly with exertion   . Fibroid    age 46  . Gallstones   . Hypertension   . IBS (irritable bowel syndrome)   . Migraine    "monthly" (08/03/2016)  . OSA on CPAP   . Ovarian cyst    1999; surgically removed  . 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 Seizures ([place])    "as a child" (08/03/2016)  . Termination of pregnancy    due to cardiac risk    Past Surgical History:  Procedure Laterality Date  . CARDIAC CATHETERIZATION N/A 11/11/2015   Procedure: Right/Left Heart Cath and Coronary Angiography;  Surgeon: Troy Sine, MD;  Location: [facility];  Service: Cardiovascular;  Laterality: N/A;  . CARDIAC  CATHETERIZATION  ~ 2015  . CARDIAC DEFIBRILLATOR PLACEMENT  08/03/2016  . CESAREAN SECTION  1999  . COLONOSCOPY WITH PROPOFOL N/A 04/21/2016   Procedure: COLONOSCOPY WITH PROPOFOL;  Surgeon: Jerene Bears, MD;  Location: [facility];  Service: Gastroenterology;  Laterality: N/A;  . ESOPHAGOGASTRODUODENOSCOPY (EGD) WITH PROPOFOL N/A 04/21/2016   Procedure: ESOPHAGOGASTRODUODENOSCOPY (EGD) WITH PROPOFOL;  Surgeon: Jerene Bears, MD;  Location: [facility];  Service: Gastroenterology;  Laterality: N/A;  . FOOT FRACTURE SURGERY Right ~ 2003  . FRACTURE SURGERY    . ICD IMPLANT N/A 08/03/2016   Procedure: ICD Implant;  Surgeon: Deboraha Sprang, MD;  Location: [facility];  Service: Cardiovascular;  Laterality: N/A;  . LAPAROSCOPIC CHOLECYSTECTOMY  12/2006  .  LAPAROSCOPY ABDOMEN DIAGNOSTIC  2008   "cut bile duct w/gallbladder OR; had to go in later & fix leak; hospitalized for 2 months"  . LEFT HEART CATHETERIZATION WITH CORONARY ANGIOGRAM N/A 02/26/2014   Procedure: LEFT HEART CATHETERIZATION WITH CORONARY ANGIOGRAM;  Surgeon: Jettie Booze, MD;  Location: [facility] CATH LAB;  Service: Cardiovascular;  Laterality: N/A;  . OVARIAN CYST REMOVAL Right 1999  . TUBAL LIGATION  1999    Current Medications:  Prior to Admission medications   Medication Sig Start Date End Date Taking? Authorizing Provider  albuterol (PROVENTIL HFA;VENTOLIN HFA) 108 (90 Base) MCG/ACT inhaler Inhale 1-2 puffs into the lungs every 6 (six) hours as needed for wheezing or shortness of breath. **PT NEEDS TO ESTABLISH WITH ANOTHER PROVIDER FOR ADDITIONAL REFILLS** Patient taking differently: Inhale 1-2 puffs into the lungs every 6 (six) hours as needed for wheezing or shortness of breath.  04/26/17  Yes Biagio Borg, MD  Armodafinil (NUVIGIL) 250 MG tablet Take 250 mg by mouth daily.   Yes [provider]  B-D ULTRAFINE III SHORT PEN 31G X 8 MM MISC USE TO TEST BLOOD SUGARS 1-2 TIMES A DAY AS NEEDED. 12/21/16  Yes Golden Circle, FNP  Biotin 5000 MCG TABS Take 5,000 mcg by mouth daily.   Yes [provider]  budesonide-formoterol (SYMBICORT) 160-4.5 MCG/ACT inhaler Inhale 2 puffs into the lungs 2 (two) times daily. 04/13/17  Yes Hulan Saas M, DO  carvedilol (COREG) 12.5 MG tablet Take 1 tablet (12.5 mg total) by mouth 2 (two) times daily with a meal. 04/30/17  Yes Isaiah Serge, NP  colestipol (COLESTID) 1 g tablet Take 1 tablet (1 g total) by mouth 2 (two) times daily. 04/30/17  Yes Isaiah Serge, NP  cyclobenzaprine (FLEXERIL) 10 MG tablet Take 1 tablet (10 mg total) by mouth 3 (three) times daily as needed for muscle spasms. 01/08/17  Yes Golden Circle, FNP  Diclofenac Sodium 2 % SOLN Apply 1 Pump topically 2 (two) times daily as needed (right shoulder  and neck pain).    Yes [provider]  dicyclomine (BENTYL) 20 MG tablet Take 20 mg by mouth 2 (two) times daily as needed for spasms.   Yes [provider]  fluticasone (FLONASE) 50 MCG/ACT nasal spray Place 2 sprays daily as needed into both nostrils for allergies. 03/30/17  Yes Nche, Charlene Brooke, NP  gabapentin (NEURONTIN) 100 MG capsule Take 200 mg by mouth at bedtime as needed (for pain).    Yes [provider]  Ibuprofen-Famotidine 800-26.6 MG TABS Take 1 tablet by mouth 3 (three) times daily as needed (for pain and/or swelling). 04/30/17  Yes Isaiah Serge, NP  Insulin Pen Needle (NOVOFINE) 32G X 6 MM MISC  Use to administer victoza every day 09/21/16  Yes Golden Circle, FNP  ivabradine (CORLANOR) 5 MG TABS tablet Take 1.5 tablets (7.5 mg total) by mouth 2 (two) times daily with a meal. 07/10/16  Yes Ayodeji Keimig, Shaune Pascal, MD  magnesium oxide (MAG-OX) 400 MG tablet Take 1 tablet (400 mg total) by mouth daily. 11/20/16  Yes Deboraha Sprang, MD  nitroGLYCERIN (NITROSTAT) 0.4 MG SL tablet Place 1 tablet (0.4 mg total) under the tongue every 5 (five) minutes as needed for chest pain. 04/30/17  Yes Isaiah Serge, NP  ondansetron (ZOFRAN ODT) 4 MG disintegrating tablet Take 1 tablet (4 mg total) by mouth every 8 (eight) hours as needed for nausea or vomiting. 05/11/16  Yes Ward, Delice Bison, DO  potassium chloride SA (K-DUR,KLOR-CON) 20 MEQ tablet Take 2 tablets (40 mEq total) by mouth daily. 04/27/17  Yes Larey Dresser, MD  PRESCRIPTION MEDICATION CPAP: AT BEDTIME   Yes [provider]  sacubitril-valsartan (ENTRESTO) 97-103 MG Take 1 tablet 2 (two) times daily by mouth. 04/13/17  Yes Arriyana Rodell, Shaune Pascal, MD  spironolactone (ALDACTONE) 25 MG tablet TAKE 1 TABLET BY MOUTH EVERY DAY Patient taking differently: Take 25 mg by mouth once a day 11/20/16  Yes Lelon Perla, MD  torsemide (DEMADEX) 20 MG tablet Take 2 tablets (40 mg total) by mouth 2 (two) times  daily. AND MAY TAKE AN ADDITIONAL DOSE OF 20 MG ONCE A DAY AS NEEDED FOR A WEIGHT GAIN OF >3-4 POUNDS IN 24 HRS 04/30/17 07/29/17 Yes Isaiah Serge, NP  venlafaxine XR (EFFEXOR-XR) 75 MG 24 hr capsule Take 225 mg by mouth daily with breakfast.   Yes [provider]  VICTOZA 18 MG/3ML SOPN Inject 0.3 mLs (1.8 mg total) into the skin daily. 01/08/17  Yes Golden Circle, FNP  vitamin B-12 (CYANOCOBALAMIN) 500 MCG tablet Take 500 mcg by mouth daily.   Yes [provider]  Vitamin D, Ergocalciferol, (DRISDOL) 50000 units CAPS capsule TAKE 1 CAPSULE (50,000 UNITS TOTAL) BY MOUTH EVERY 7 (SEVEN) DAYS. Patient taking differently: Take 50,000 Units by mouth every Wednesday.  03/22/17  Yes Lyndal Pulley, DO  zolpidem (AMBIEN) 5 MG tablet Take 1 tablet (5 mg total) by mouth at bedtime as needed for sleep. 04/30/17  Yes Isaiah Serge, NP    Allergies:   Vancomycin; Contrast media [iodinated diagnostic agents]; Avocado; Aspirin; Ciprofloxacin; and Sulfa antibiotics   Social History   Socioeconomic History  . Marital status: Married    Spouse name: Not on file  . Number of children: 1  . Years of education: Not on file  . Highest education level: Not on file  Social Needs  . Financial resource strain: Not on file  . Food insecurity - worry: Not on file  . Food insecurity - inability: Not on file  . Transportation needs - medical: Not on file  . Transportation needs - non-medical: Not on file  Occupational History  . Occupation: stay at home mom  Tobacco Use  . Smoking status: Never Smoker  . Smokeless tobacco: Never Used  Substance and Sexual Activity  . Alcohol use: No  . Drug use: No  . Sexual activity: Yes  Other Topics Concern  . Not on file  Social History Narrative  . Not on file     Family History:  The patient's family history includes Allergies in her daughter; Emphysema in her maternal grandmother; Heart disease (age of onset: 40) in her maternal  grandmother; Rheum arthritis in her mother.    PHYSICAL EXAM:    Vitals:   08/05/17 0645  BP: 107/65  Pulse: 78  Resp: 20  Temp: 98.1 F (36.7 C)  TempSrc: Oral  SpO2: 99%  Weight: 100.7 kg (222 lb)  Height: 5' 4.5" (1.638 m)    General:  Well appearing. No resp difficulty HEENT: normal Neck: supple. JVP 6. Carotids 2+ bilat; no bruits. No lymphadenopathy or thryomegaly appreciated. Cor: PMI nondisplaced. Regular rate & rhythm. No rubs, gallops or murmurs. Lungs: clear Abdomen: obese, soft, nontender, nondistended. No hepatosplenomegaly. No bruits or masses. Good bowel sounds. Extremities: no cyanosis, clubbing, rash, edema Neuro: alert & orientedx3, cranial nerves grossly intact. moves all 4 extremities w/o difficulty. Affect pleasant    Wt Readings from Last 3 Encounters:  08/05/17 100.7 kg (222 lb)  07/05/17 106.8 kg (235 lb 6 oz)  06/23/17 102.9 kg (226 lb 12.8 oz)      Studies/Labs Reviewed:    Recent Labs: 04/28/2017: B Natriuretic Peptide 478.0 06/11/2017: Magnesium 2.3 07/21/2017: BUN 16; Creatinine 0.9; Potassium 4.1; Sodium 137; TSH 1.82 08/05/2017: Hemoglobin 13.1; Platelets 220   Lipid Panel    Component Value Date/Time   CHOL 160 11/09/2015 0526   TRIG 70 11/09/2015 0526   HDL 58 11/09/2015 0526   CHOLHDL 2.8 11/09/2015 0526   VLDL 14 11/09/2015 0526   LDLCALC 88 11/09/2015 0526    Additional studies/ records that were reviewed today include:  No recent laboratory data.    ASSESSMENT:    1. Chronic systolic HF:  due to peri-partum CM, onset 1999 - EF 20-25% (12/17). Echo 8/18 EF 25-30% s/p ICD 3/18 - Continues with NYHA III symptoms but CPX suggests better exercise tolerance with moderate limitation due mostly to obesity with some HF component - Volume status looks good.  - BMET today - Continue Entresto 97/103 mg BID - Continue Coreg 12.5 mg BID  - Continue Spiro 25mg  daily.  - Continue corlanor 7.5mg  BID. - ICD placed in March 2018.    2. Morbid obesity - Planning for Bariatric Surgery at [facility] 3. OSA -Compliant with CPAP.  4. Anxiety/depression - Follows with PCP.  5. DM2 - A1c in Jan. 2018 is 5.3. Per PCP. Consider Jardiance 6. HTN - Blood pressure well controlled. Continue current regimen. 7. Pre-op cardiology eval - Likely moderate to high risk for Bariatric Surgery but needs surgery and should proceed if RHC numbers acceptable   Glori Bickers, MD  08/05/2017 8:19 AM

## 2017-08-05 NOTE — Interval H&P Note (Signed)
History and Physical Interval Note:  08/05/2017 9:18 AM  [name]  has presented today for surgery, with the diagnosis of chf  The various methods of treatment have been discussed with the patient and family. After consideration of risks, benefits and other options for treatment, the patient has consented to  Procedure(s): RIGHT HEART CATH (N/A) as a surgical intervention .  The patient's history has been reviewed, patient examined, no change in status, stable for surgery.  I have reviewed the patient's chart and labs.  Questions were answered to the patient's satisfaction.     Bethanee Redondo

## 2017-08-05 NOTE — Discharge Instructions (Signed)
Brachial Site Care °Refer to this sheet in the next few weeks. These instructions provide you with information about caring for yourself after your procedure. Your health care provider may also give you more specific instructions. Your treatment has been planned according to current medical practices, but problems sometimes occur. Call your health care provider if you have any problems or questions after your procedure. °What can I expect after the procedure? °After your procedure, it is typical to have the following: °· Bruising at the brachial site that usually fades within 1-2 weeks. °· Blood collecting in the tissue (hematoma) that may be painful to the touch. It should usually decrease in size and tenderness within 1-2 weeks. ° °Follow these instructions at home: °· Take medicines only as directed by your health care provider. °· You may shower 24-48 hours after the procedure or as directed by your health care provider. Remove the bandage (dressing) and gently wash the site with plain soap and water. Pat the area dry with a clean towel. Do not rub the site, because this may cause bleeding. °· Do not take baths, swim, or use a hot tub until your health care provider approves. °· Check your insertion site every day for redness, swelling, or drainage. °· Do not apply powder or lotion to the site. °· Do not push or pull heavy objects with the affected arm for 24 hours or as directed by your health care provider. °· Do not lift over 10 lb (4.5 kg) for 3 days after your procedure or as directed by your health care provider. °· Ask your health care provider when it is okay to: °? Return to work or school. °? Resume usual physical activities or sports. °? Resume sexual activity. °· Do not drive home if you are discharged the same day as the procedure. Have someone else drive you. °· You may drive 24 hours after the procedure unless otherwise instructed by your health care provider. °· Do not operate machinery or power  tools for 24 hours after the procedure. °· If your procedure was done as an outpatient procedure, which means that you went home the same day as your procedure, a responsible adult should be with you for the first 24 hours after you arrive home. °· Keep all follow-up visits as directed by your health care provider. This is important. °Contact a health care provider if: °· You have a fever. °· You have chills. °· You have increased bleeding from the radial site. Hold pressure on the site. °Get help right away if: °· You have unusual pain at the brachial site. °· You have redness, warmth, or swelling at the brachial site. °· You have drainage (other than a small amount of blood on the dressing) from the brachial site. °· The brachial site is bleeding, and the bleeding does not stop after 30 minutes of holding steady pressure on the site. ° °This information is not intended to replace advice given to you by your health care provider. Make sure you discuss any questions you have with your health care provider. °Document Released: 06/13/2010 Document Revised: 10/17/2015 Document Reviewed: 11/27/2013 °Elsevier Interactive Patient Education © 2018 [employer] Inc[employer] ° °

## 2017-08-06 NOTE — Progress Notes (Signed)
[facility] PREP Weekly Session   Patient Details  Name: [name] MRN: [id number] Date of Birth: [birth year] Age: 42 y.o. PCP: Hoyt Koch, MD  Vitals:   08/06/17 0932  Weight: 224 lb 6.4 oz (101.8 kg)    [facility] Weekly seesion - 08/06/17 1000      Weekly Session   Topic Discussed  Stress management and problem solving    Minutes exercised this week  75 minutes 0cardio/30strength/6flexibility   0cardio/30strength/63flexibility   Classes attended to date  5      Fun things you did since last meeting:"bought a juicer. Lost weight" Things you are grateful for:"my daughter, my husband, upcoming heart check-up" Nutrition celebrations:"drank less sugar, more green juices" Barriers:" eating regularly, less soda drinking:("  [name] 08/06/2017, 10:38 AM

## 2017-08-11 DIAGNOSIS — Z95810 Presence of automatic (implantable) cardiac defibrillator: Secondary | ICD-10-CM | POA: Diagnosis not present

## 2017-08-11 DIAGNOSIS — I5042 Chronic combined systolic (congestive) and diastolic (congestive) heart failure: Secondary | ICD-10-CM | POA: Diagnosis not present

## 2017-08-11 DIAGNOSIS — G4733 Obstructive sleep apnea (adult) (pediatric): Secondary | ICD-10-CM | POA: Diagnosis not present

## 2017-08-11 DIAGNOSIS — I110 Hypertensive heart disease with heart failure: Secondary | ICD-10-CM | POA: Diagnosis not present

## 2017-08-11 DIAGNOSIS — Z9989 Dependence on other enabling machines and devices: Secondary | ICD-10-CM | POA: Diagnosis not present

## 2017-08-11 DIAGNOSIS — E119 Type 2 diabetes mellitus without complications: Secondary | ICD-10-CM | POA: Diagnosis not present

## 2017-08-11 DIAGNOSIS — I428 Other cardiomyopathies: Secondary | ICD-10-CM | POA: Diagnosis not present

## 2017-08-17 ENCOUNTER — Ambulatory Visit (HOSPITAL_COMMUNITY)
Admission: RE | Admit: 2017-08-17 | Discharge: 2017-08-17 | Disposition: A | Discharge status: Home / Self Care | Payer: BLUE CROSS/BLUE SHIELD | Source: Ambulatory Visit | Attending: Internal Medicine | Admitting: Internal Medicine

## 2017-08-17 ENCOUNTER — Encounter (HOSPITAL_COMMUNITY): Payer: Self-pay | Admitting: Internal Medicine

## 2017-08-17 VITALS — BP 117/76 | HR 62 | Wt 230.0 lb

## 2017-08-17 DIAGNOSIS — F329 Major depressive disorder, single episode, unspecified: Secondary | ICD-10-CM | POA: Insufficient documentation

## 2017-08-17 DIAGNOSIS — G4733 Obstructive sleep apnea (adult) (pediatric): Secondary | ICD-10-CM | POA: Insufficient documentation

## 2017-08-17 DIAGNOSIS — K589 Irritable bowel syndrome without diarrhea: Secondary | ICD-10-CM | POA: Diagnosis not present

## 2017-08-17 DIAGNOSIS — E119 Type 2 diabetes mellitus without complications: Secondary | ICD-10-CM | POA: Insufficient documentation

## 2017-08-17 DIAGNOSIS — Z886 Allergy status to analgesic agent status: Secondary | ICD-10-CM | POA: Insufficient documentation

## 2017-08-17 DIAGNOSIS — N83209 Unspecified ovarian cyst, unspecified side: Secondary | ICD-10-CM | POA: Insufficient documentation

## 2017-08-17 DIAGNOSIS — Z95810 Presence of automatic (implantable) cardiac defibrillator: Secondary | ICD-10-CM | POA: Insufficient documentation

## 2017-08-17 DIAGNOSIS — Z825 Family history of asthma and other chronic lower respiratory diseases: Secondary | ICD-10-CM | POA: Insufficient documentation

## 2017-08-17 DIAGNOSIS — I5022 Chronic systolic (congestive) heart failure: Secondary | ICD-10-CM | POA: Insufficient documentation

## 2017-08-17 DIAGNOSIS — Z882 Allergy status to sulfonamides status: Secondary | ICD-10-CM | POA: Diagnosis not present

## 2017-08-17 DIAGNOSIS — F419 Anxiety disorder, unspecified: Secondary | ICD-10-CM | POA: Diagnosis not present

## 2017-08-17 DIAGNOSIS — Z91041 Radiographic dye allergy status: Secondary | ICD-10-CM | POA: Diagnosis not present

## 2017-08-17 DIAGNOSIS — Z881 Allergy status to other antibiotic agents status: Secondary | ICD-10-CM | POA: Insufficient documentation

## 2017-08-17 DIAGNOSIS — Z79899 Other long term (current) drug therapy: Secondary | ICD-10-CM | POA: Diagnosis not present

## 2017-08-17 DIAGNOSIS — Z8249 Family history of ischemic heart disease and other diseases of the circulatory system: Secondary | ICD-10-CM | POA: Diagnosis not present

## 2017-08-17 DIAGNOSIS — J45909 Unspecified asthma, uncomplicated: Secondary | ICD-10-CM | POA: Diagnosis not present

## 2017-08-17 DIAGNOSIS — I110 Hypertensive heart disease with heart failure: Secondary | ICD-10-CM | POA: Insufficient documentation

## 2017-08-17 DIAGNOSIS — M1990 Unspecified osteoarthritis, unspecified site: Secondary | ICD-10-CM | POA: Insufficient documentation

## 2017-08-17 DIAGNOSIS — Z9889 Other specified postprocedural states: Secondary | ICD-10-CM | POA: Insufficient documentation

## 2017-08-17 DIAGNOSIS — Z6838 Body mass index (BMI) 38.0-38.9, adult: Secondary | ICD-10-CM | POA: Diagnosis not present

## 2017-08-17 MED ORDER — TORSEMIDE 20 MG PO TABS: 40.0000 mg | ORAL_TABLET | Freq: Every day | ORAL | 3 refills | Status: DC

## 2017-08-17 NOTE — Progress Notes (Signed)
Advanced [facility] Clinic Note  Date:  08/17/2017   ID:  [name], DOB 06-Jul-1975, MRN [id number]  PCP:  Hoyt Koch, MD  Cardiologist: Pernell Dupre, MD  Subjective   [name] is a 42 y.o. 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   Cath 6/17 revealed normal vessels, moderate elevation in pulmonary artery pressures, and LVEF 25-30%. Echo 8/18 EF 25%  CPX 12/18 pVO2 14.0 (corrects to 23.0 for IBW) VeVCO2  32 RER 1.0  RHC 6/17 RA 5 RV 35/6 PA 41/12 PW 11 Fick 6.4/3.0 PA sat 62%  Echo 6/15 35-40% Echo 6/17 25-30% Echo 12/17 20-25% Echo 8/18 EF 25%  CPX 2/18  FVC 2.57 (79%)    FEV1 2.14 (81%)     FEV1/FVC 83 (101%)     MVV 107 (102%) Resting HR: 106 Peak HR: 166  (93% age predicted max HR)  BP rest: 136/98 BP peak: 174/88 Peak VO2: 17.1 (85% predicted peak VO2) - corrects to 28.4 for ibw VE/VCO2 slope: 33 OUES: 2.16  Peak RER: 1.09 Ventilatory Threshold: 14.6 (73% predicted or measured peak VO2) VE/MVV: 59% PETCO2 at peak: 33 O2pulse: 10  (83% predicted O2pulse)  48 holter in 2/18 2% PVCS  Was admitted 12/5-11/2016 with recurrent HF, URI, CP and elevated BP (160/110). CT chest no PE. Diuresed from 233-> 228 pounds.   She returns today for HF follow up. She had a RHC earlier this month as part of her pre-op evaluation for bariatric surgery. She has received cardiac clearance and is waiting to be scheduled for surgery at [place] with SOB with stairs and walking fast but otherwise does well. Has to take breaks while grocery shopping. No edema, sleeps on 2-3 pillows, no PND. Wears CPAP qHS. No CP, dizziness, or palpitations. Weights at home: 224-228 lbs. No extra torsemide in the last month. Tries to limit fluid and salt intake. Compliant with  medications. Rarely misses an evening torsemide dose.   [place] 3/19: RA = 2 RV = 26/7 PA = 28/13 (19) PCW = 5 Fick cardiac output/index = 5.7/2.8 PVR = 2.5 WU Ao sat = 99% PA sat = 71%, 72% High SVC sat = 65%   Past Medical History:  Diagnosis Date  . AICD (automatic cardioverter/defibrillator) present   . Anxiety   . Arthritis    right shoulder   . Asthma   . CHF (congestive heart failure) ([place])   . Depression   . Diabetes mellitus without complication ([place])   . Diverticulosis   . Dyspnea    comes and goes intermittently mostly with exertion   . Fibroid    age 43  . Gallstones   . Hypertension   . IBS (irritable bowel syndrome)   . Migraine    "monthly" (08/03/2016)  . OSA on CPAP   . Ovarian cyst    1999; surgically removed  . 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 Seizures ([place])    "as a child" (08/03/2016)  . Termination of pregnancy    due to cardiac risk    Past Surgical History:  Procedure Laterality Date  . CARDIAC CATHETERIZATION N/A 11/11/2015   Procedure: Right/Left Heart Cath and Coronary Angiography;  Surgeon: Troy Sine, MD;  Location: [facility];  Service: Cardiovascular;  Laterality: N/A;  .  CARDIAC CATHETERIZATION  ~ 2015  . CARDIAC DEFIBRILLATOR PLACEMENT  08/03/2016  . CESAREAN SECTION  1999  . COLONOSCOPY WITH PROPOFOL N/A 04/21/2016   Procedure: COLONOSCOPY WITH PROPOFOL;  Surgeon: Jerene Bears, MD;  Location: [facility];  Service: Gastroenterology;  Laterality: N/A;  . ESOPHAGOGASTRODUODENOSCOPY (EGD) WITH PROPOFOL N/A 04/21/2016   Procedure: ESOPHAGOGASTRODUODENOSCOPY (EGD) WITH PROPOFOL;  Surgeon: Jerene Bears, MD;  Location: [facility];  Service: Gastroenterology;  Laterality: N/A;  . FOOT FRACTURE SURGERY Right ~ 2003  . FRACTURE SURGERY    . ICD IMPLANT N/A 08/03/2016   Procedure: ICD Implant;  Surgeon: Deboraha Sprang, MD;  Location: [facility];  Service: Cardiovascular;  Laterality: N/A;  .  LAPAROSCOPIC CHOLECYSTECTOMY  12/2006  . LAPAROSCOPY ABDOMEN DIAGNOSTIC  2008   "cut bile duct w/gallbladder OR; had to go in later & fix leak; hospitalized for 2 months"  . LEFT HEART CATHETERIZATION WITH CORONARY ANGIOGRAM N/A 02/26/2014   Procedure: LEFT HEART CATHETERIZATION WITH CORONARY ANGIOGRAM;  Surgeon: Jettie Booze, MD;  Location: [facility];  Service: Cardiovascular;  Laterality: N/A;  . OVARIAN CYST REMOVAL Right 1999  . RIGHT HEART CATH N/A 08/05/2017   Procedure: RIGHT HEART CATH;  Surgeon: Jolaine Artist, MD;  Location: [facility];  Service: Cardiovascular;  Laterality: N/A;  . TUBAL LIGATION  1999    Current Medications:  Prior to Admission medications   Medication Sig Start Date End Date Taking? Authorizing Provider  albuterol (PROVENTIL HFA;VENTOLIN HFA) 108 (90 Base) MCG/ACT inhaler Inhale 1-2 puffs into the lungs every 6 (six) hours as needed for wheezing or shortness of breath. **PT NEEDS TO ESTABLISH WITH ANOTHER PROVIDER FOR ADDITIONAL REFILLS** Patient taking differently: Inhale 1-2 puffs into the lungs every 6 (six) hours as needed for wheezing or shortness of breath.  04/26/17  Yes Biagio Borg, MD  Armodafinil (NUVIGIL) 250 MG tablet Take 250 mg by mouth daily.   Yes [provider]  B-D ULTRAFINE III SHORT PEN 31G X 8 MM MISC USE TO TEST BLOOD SUGARS 1-2 TIMES A DAY AS NEEDED. 12/21/16  Yes Golden Circle, FNP  Biotin 5000 MCG TABS Take 5,000 mcg by mouth daily.   Yes [provider]  budesonide-formoterol (SYMBICORT) 160-4.5 MCG/ACT inhaler Inhale 2 puffs into the lungs 2 (two) times daily. 04/13/17  Yes Hulan Saas M, DO  carvedilol (COREG) 12.5 MG tablet Take 1 tablet (12.5 mg total) by mouth 2 (two) times daily with a meal. 04/30/17  Yes Isaiah Serge, NP  colestipol (COLESTID) 1 g tablet Take 1 tablet (1 g total) by mouth 2 (two) times daily. 04/30/17  Yes Isaiah Serge, NP  cyclobenzaprine (FLEXERIL) 10 MG tablet  Take 1 tablet (10 mg total) by mouth 3 (three) times daily as needed for muscle spasms. 01/08/17  Yes Golden Circle, FNP  Diclofenac Sodium 2 % SOLN Apply 1 Pump topically 2 (two) times daily as needed (right shoulder and neck pain).    Yes [provider]  dicyclomine (BENTYL) 20 MG tablet Take 20 mg by mouth 2 (two) times daily as needed for spasms.   Yes [provider]  fluticasone (FLONASE) 50 MCG/ACT nasal spray Place 2 sprays daily as needed into both nostrils for allergies. 03/30/17  Yes Nche, Charlene Brooke, NP  gabapentin (NEURONTIN) 100 MG capsule Take 200 mg by mouth at bedtime as needed (for pain).    Yes [provider]  Ibuprofen-Famotidine 800-26.6 MG TABS  Take 1 tablet by mouth 3 (three) times daily as needed (for pain and/or swelling). 04/30/17  Yes Isaiah Serge, NP  Insulin Pen Needle (NOVOFINE) 32G X 6 MM MISC Use to administer victoza every day 09/21/16  Yes Golden Circle, FNP  ivabradine (CORLANOR) 5 MG TABS tablet Take 1.5 tablets (7.5 mg total) by mouth 2 (two) times daily with a meal. 07/10/16  Yes Yilia Sacca, Shaune Pascal, MD  magnesium oxide (MAG-OX) 400 MG tablet Take 1 tablet (400 mg total) by mouth daily. 11/20/16  Yes Deboraha Sprang, MD  nitroGLYCERIN (NITROSTAT) 0.4 MG SL tablet Place 1 tablet (0.4 mg total) under the tongue every 5 (five) minutes as needed for chest pain. 04/30/17  Yes Isaiah Serge, NP  ondansetron (ZOFRAN ODT) 4 MG disintegrating tablet Take 1 tablet (4 mg total) by mouth every 8 (eight) hours as needed for nausea or vomiting. 05/11/16  Yes Ward, Delice Bison, DO  potassium chloride SA (K-DUR,KLOR-CON) 20 MEQ tablet Take 2 tablets (40 mEq total) by mouth daily. 04/27/17  Yes Larey Dresser, MD  PRESCRIPTION MEDICATION CPAP: AT BEDTIME   Yes [provider]  sacubitril-valsartan (ENTRESTO) 97-103 MG Take 1 tablet 2 (two) times daily by mouth. 04/13/17  Yes Raizy Auzenne, Shaune Pascal, MD  spironolactone (ALDACTONE) 25 MG  tablet TAKE 1 TABLET BY MOUTH EVERY DAY Patient taking differently: Take 25 mg by mouth once a day 11/20/16  Yes Lelon Perla, MD  torsemide (DEMADEX) 20 MG tablet Take 2 tablets (40 mg total) by mouth 2 (two) times daily. AND MAY TAKE AN ADDITIONAL DOSE OF 20 MG ONCE A DAY AS NEEDED FOR A WEIGHT GAIN OF >3-4 POUNDS IN 24 HRS 04/30/17 07/29/17 Yes Isaiah Serge, NP  venlafaxine XR (EFFEXOR-XR) 75 MG 24 hr capsule Take 225 mg by mouth daily with breakfast.   Yes [provider]  VICTOZA 18 MG/3ML SOPN Inject 0.3 mLs (1.8 mg total) into the skin daily. 01/08/17  Yes Golden Circle, FNP  vitamin B-12 (CYANOCOBALAMIN) 500 MCG tablet Take 500 mcg by mouth daily.   Yes [provider]  Vitamin D, Ergocalciferol, (DRISDOL) 50000 units CAPS capsule TAKE 1 CAPSULE (50,000 UNITS TOTAL) BY MOUTH EVERY 7 (SEVEN) DAYS. Patient taking differently: Take 50,000 Units by mouth every Wednesday.  03/22/17  Yes Lyndal Pulley, DO  zolpidem (AMBIEN) 5 MG tablet Take 1 tablet (5 mg total) by mouth at bedtime as needed for sleep. 04/30/17  Yes Isaiah Serge, NP    Allergies:   Vancomycin; Contrast media [iodinated diagnostic agents]; Avocado; Aspirin; Ciprofloxacin; and Sulfa antibiotics   Social History   Socioeconomic History  . Marital status: Married    Spouse name: Not on file  . Number of children: 1  . Years of education: Not on file  . Highest education level: Not on file  Occupational History  . Occupation: stay at home mom  Social Needs  . Financial resource strain: Not on file  . Food insecurity:    Worry: Not on file    Inability: Not on file  . Transportation needs:    Medical: Not on file    Non-medical: Not on file  Tobacco Use  . Smoking status: Never Smoker  . Smokeless tobacco: Never Used  Substance and Sexual Activity  . Alcohol use: No  . Drug use: No  . Sexual activity: Yes  Lifestyle  . Physical activity:    Days per week: Not on file  Minutes per  session: Not on file  . Stress: Not on file  Relationships  . Social connections:    Talks on phone: Not on file    Gets together: Not on file    Attends religious service: Not on file    Active member of club or organization: Not on file    Attends meetings of clubs or organizations: Not on file    Relationship status: Not on file  Other Topics Concern  . Not on file  Social History Narrative  . Not on file     Family History:  The patient's family history includes Allergies in her daughter; Emphysema in her maternal grandmother; Heart disease (age of onset: 78) in her maternal grandmother; Rheum arthritis in her mother.    PHYSICAL EXAM:    Vitals:   08/17/17 1454  BP: 117/76  Pulse: 62  SpO2: 99%  Weight: 230 lb (104.3 kg)   General: obese. Well appearing. No resp difficulty. HEENT: Normal anicteric  Neck: Supple. No JVD. Carotids 2+ bilat; no bruits. No thyromegaly or nodule noted. Cor: PMI nondisplaced. RRR, No M/G/R noted Lungs: CTAB, normal effort. Abdomen: Obese, soft, non-tender, non-distended, no HSM. No bruits or masses. +BS  Extremities: No cyanosis, clubbing, or rash. R and LLE no edema. Warm  Neuro: Alert & orientedx3, cranial nerves grossly intact. moves all 4 extremities w/o difficulty. Affect pleasant   Wt Readings from Last 3 Encounters:  08/17/17 230 lb (104.3 kg)  08/05/17 222 lb (100.7 kg)  08/06/17 224 lb 6.4 oz (101.8 kg)      Studies/Labs Reviewed:    Recent Labs: 04/28/2017: B Natriuretic Peptide 478.0 06/11/2017: Magnesium 2.3 07/21/2017: TSH 1.82 08/05/2017: BUN 17; Creatinine, Ser 1.10; Hemoglobin 13.1; Platelets 220; Potassium 3.5; Sodium 135   Lipid Panel    Component Value Date/Time   CHOL 160 11/09/2015 0526   TRIG 70 11/09/2015 0526   HDL 58 11/09/2015 0526   CHOLHDL 2.8 11/09/2015 0526   VLDL 14 11/09/2015 0526   LDLCALC 88 11/09/2015 0526    Additional studies/ records that were reviewed today include:  No recent  laboratory data.    ASSESSMENT:    1. Chronic systolic HF:  due to peri-partum CM, onset 1999. S/P [employer] ICD 07/2016 - EF 20-25% (12/17). Echo 8/18 EF 25-30% s/p ICD 3/18. [place] 3/19: RA 2, PA 28/13, Fick CO 5.7, CI 2.8 - NYHA class II-III  - CPX 12/18 showed moderate limitation due mostly to obesity with some HF component - Volume status looks good.  - Decrease torsemide to 40 mg once daily with low filling pressures on cath. She can take extra as needed.  - Continue Entresto 97/103 mg BID - Continue Coreg 12.5 mg BID (cut back during hospitalization due to low BP)  - Continue Spiro 25mg  daily.  - Continue corlanor 7.5mg  BID. HR is 62  2. Morbid obesity - Plans for gastric sleeve at [facility] in the next month or so.  - Body mass index is 38.87 kg/m.  - She will likely need to cut back on HF meds after surgery.  3. OSA -Compliant with CPAP. No change 4. Anxiety/depression - Follows with PCP.  5. DM2 - A1c in Jan. 2018 is 5.3 with medications. Per PCP. Consider Jardiance 6. HTN - Stable on HF meds.   Decrease torsemide 40 mg daily.   Georgiana Shore, NP  08/17/2017 2:59 PM     Patient seen and examined with the above-signed Advanced Practice  Provider and/or Housestaff. I personally reviewed laboratory data, imaging studies and relevant notes. I independently examined the patient and formulated the important aspects of the plan. I have edited the note to reflect any of my changes or salient points. I have personally discussed the plan with the patient and/or family.  Results of recent [place] reviewed with her. She is doing well. Very well compensated despite persistently low EF. Agree with clearance for bariatric surgery. We discussed the fact that she may need to cut back HF meds post-op if BP drops too much. With recent [place] numbers can drop torsemide to once a day.   Will see back 2-3 weeks after surgery.   Glori Bickers, MD  10:15 PM

## 2017-08-17 NOTE — Patient Instructions (Signed)
Decrease Torsemide to 40mg  daily,  Follow up with Dr.Bensimhon in 3 months. **Please call our office at [phone] to schedule appointment 2-3 weeks after bariatric surgery**

## 2017-08-23 ENCOUNTER — Other Ambulatory Visit: Payer: Self-pay | Admitting: Family Medicine

## 2017-08-25 NOTE — Progress Notes (Signed)
[facility] PREP Weekly Session   Patient Details  Name: [name] MRN: [id number] Date of Birth: 1976/03/12 Age: 42 y.o. PCP: Hoyt Koch, MD  There were no vitals filed for this visit.  [facility] Weekly seesion - 08/06/17 1000      Weekly Session   Topic Discussed  Stress management and problem solving    Minutes exercised this week  75 minutes 0cardio/30strength/48flexibility   0cardio/30strength/27flexibility   Classes attended to date  5      [name] has been having GI issues and has missed coming a few weeks.  She came late today d/t the GI issues and is going to do what she can as far as exercise goes.    [name] 08/25/2017, 12:09 PM

## 2017-08-30 ENCOUNTER — Ambulatory Visit (INDEPENDENT_AMBULATORY_CARE_PROVIDER_SITE_OTHER): Payer: Medicare Other | Admitting: *Deleted

## 2017-08-30 ENCOUNTER — Telehealth: Payer: Self-pay | Admitting: Cardiology

## 2017-08-30 DIAGNOSIS — I428 Other cardiomyopathies: Secondary | ICD-10-CM

## 2017-08-30 DIAGNOSIS — Z95810 Presence of automatic (implantable) cardiac defibrillator: Secondary | ICD-10-CM

## 2017-08-30 DIAGNOSIS — I5042 Chronic combined systolic (congestive) and diastolic (congestive) heart failure: Secondary | ICD-10-CM

## 2017-08-30 DIAGNOSIS — I5022 Chronic systolic (congestive) heart failure: Secondary | ICD-10-CM

## 2017-08-30 NOTE — Telephone Encounter (Signed)
Spoke with pt and reminded pt of remote transmission that is due today. Pt verbalized understanding.   

## 2017-08-31 NOTE — Progress Notes (Signed)
Remote ICD transmission.   

## 2017-09-02 ENCOUNTER — Encounter: Payer: Self-pay | Admitting: Cardiology

## 2017-09-02 NOTE — Progress Notes (Signed)
EPIC Encounter for ICM Monitoring  Patient Name: DOVE GRESHAM is a 42 y.o. female Date: 09/02/2017 Primary Care Physican: Hoyt Koch, MD Primary Cardiologist: Smith/Bensimhon Electrophysiologist: Faustino Congress Weight: 220lbs           Heart Failure questions reviewed, pt asymptomatic.  She reported she is feeling well.     08/30/2017 [facility] HF Index: 0 and no suggestion of fluid accumulation.     Prescribed dosage: Torsemide 20 mg take 2 tablets (40 mg) by mouth twice daily (Dr Bensimhon's 08/17/17 says to decrease Torsemide to 40 mg daily and may take extra as needed). Potassium 20 mEq 2tablets (40 mEq total) daily.  Labs: 06/11/2017 Creatinine 1.11, BUN 23, Potassium 4.1, Sodium 136, EGFR 68.93 04/30/2017 Creatinine1.11, BUN12, Potassium3.9, Sodium137, EGFR>60  04/29/2017 Creatinine1.01, BUN11, Potassium4.3, Sodium135, EGFR>60  04/28/2017 Creatinine0.78, BUN<5, Potassium4.1, Sodium137, EGFR>60 02/03/2017 Creatinine 0.84, BUN 5, Potassium 3.3, Sodium 135, EGFR >60 01/17/2017 Creatinine 0.76, BUN >5, Potassium 3.2, Sodium 135, EGFR >60 01/07/2017 Creatinine 0.86, BUN 8, Potassium 3.4, Sodium 139, EGFR >60 10/07/2016 Creatinine 0.88, BUN 7, Potassium 4.1, Sodium 137, EGFR >60 07/30/2016 Creatinine 0.95, BUN 13, Potassium 3.8, Sodium 138, EGFR 74-85  06/24/2016 Creatinine 0.93, BUN 8, Potassium 3.5, Sodium 138, EGFR >60  06/22/2016 Creatinine 0.77, BUN 6, Potassium 3.9, Sodium 141, EGFR 96-110  05/28/2016 Creatinine 0.81, BUN 7, Potassium 3.4, Sodium 139  Recommendations: No changes.  She said she has been taking Torsemide 40 mg in AM and 20 mg in PM.  Advised last office note instructed her to decrease Torsemide to 40 mg daily and take extra as needed.  She will start that dosage today.  Encouraged to call for fluid symptoms.  Follow-up plan: ICM clinic phone appointment on 09/30/2017.          Copy of ICM check sent to Dr. Caryl Comes.  3  Month Trend    8 Day Data Trend            [name], RN 09/02/2017 1:49 PM

## 2017-09-04 ENCOUNTER — Other Ambulatory Visit: Payer: Self-pay | Admitting: Internal Medicine

## 2017-09-06 DIAGNOSIS — E669 Obesity, unspecified: Secondary | ICD-10-CM | POA: Diagnosis not present

## 2017-09-06 DIAGNOSIS — Z01818 Encounter for other preprocedural examination: Secondary | ICD-10-CM | POA: Diagnosis not present

## 2017-09-06 DIAGNOSIS — Z6838 Body mass index (BMI) 38.0-38.9, adult: Secondary | ICD-10-CM | POA: Diagnosis not present

## 2017-09-06 DIAGNOSIS — Z713 Dietary counseling and surveillance: Secondary | ICD-10-CM | POA: Diagnosis not present

## 2017-09-09 DIAGNOSIS — Z6837 Body mass index (BMI) 37.0-37.9, adult: Secondary | ICD-10-CM | POA: Diagnosis not present

## 2017-09-09 DIAGNOSIS — Z01818 Encounter for other preprocedural examination: Secondary | ICD-10-CM | POA: Diagnosis not present

## 2017-09-09 DIAGNOSIS — I5042 Chronic combined systolic (congestive) and diastolic (congestive) heart failure: Secondary | ICD-10-CM | POA: Diagnosis not present

## 2017-09-20 DIAGNOSIS — I110 Hypertensive heart disease with heart failure: Secondary | ICD-10-CM | POA: Diagnosis present

## 2017-09-20 DIAGNOSIS — E669 Obesity, unspecified: Secondary | ICD-10-CM | POA: Diagnosis not present

## 2017-09-20 DIAGNOSIS — K589 Irritable bowel syndrome without diarrhea: Secondary | ICD-10-CM | POA: Diagnosis present

## 2017-09-20 DIAGNOSIS — G4700 Insomnia, unspecified: Secondary | ICD-10-CM | POA: Diagnosis present

## 2017-09-20 DIAGNOSIS — G4733 Obstructive sleep apnea (adult) (pediatric): Secondary | ICD-10-CM | POA: Diagnosis not present

## 2017-09-20 DIAGNOSIS — I5042 Chronic combined systolic (congestive) and diastolic (congestive) heart failure: Secondary | ICD-10-CM | POA: Diagnosis not present

## 2017-09-20 DIAGNOSIS — Z79899 Other long term (current) drug therapy: Secondary | ICD-10-CM | POA: Diagnosis not present

## 2017-09-20 DIAGNOSIS — Z881 Allergy status to other antibiotic agents status: Secondary | ICD-10-CM | POA: Diagnosis not present

## 2017-09-20 DIAGNOSIS — Z91018 Allergy to other foods: Secondary | ICD-10-CM | POA: Diagnosis not present

## 2017-09-20 DIAGNOSIS — E119 Type 2 diabetes mellitus without complications: Secondary | ICD-10-CM | POA: Diagnosis not present

## 2017-09-20 DIAGNOSIS — I428 Other cardiomyopathies: Secondary | ICD-10-CM | POA: Diagnosis not present

## 2017-09-20 DIAGNOSIS — Z888 Allergy status to other drugs, medicaments and biological substances status: Secondary | ICD-10-CM | POA: Diagnosis not present

## 2017-09-20 DIAGNOSIS — Z9884 Bariatric surgery status: Secondary | ICD-10-CM | POA: Insufficient documentation

## 2017-09-20 DIAGNOSIS — F419 Anxiety disorder, unspecified: Secondary | ICD-10-CM | POA: Diagnosis present

## 2017-09-20 DIAGNOSIS — E785 Hyperlipidemia, unspecified: Secondary | ICD-10-CM | POA: Diagnosis present

## 2017-09-20 DIAGNOSIS — E1142 Type 2 diabetes mellitus with diabetic polyneuropathy: Secondary | ICD-10-CM | POA: Diagnosis present

## 2017-09-20 DIAGNOSIS — I119 Hypertensive heart disease without heart failure: Secondary | ICD-10-CM | POA: Diagnosis not present

## 2017-09-20 DIAGNOSIS — Z95810 Presence of automatic (implantable) cardiac defibrillator: Secondary | ICD-10-CM | POA: Diagnosis not present

## 2017-09-20 DIAGNOSIS — K2960 Other gastritis without bleeding: Secondary | ICD-10-CM | POA: Diagnosis not present

## 2017-09-20 DIAGNOSIS — F329 Major depressive disorder, single episode, unspecified: Secondary | ICD-10-CM | POA: Diagnosis present

## 2017-09-20 DIAGNOSIS — Z882 Allergy status to sulfonamides status: Secondary | ICD-10-CM | POA: Diagnosis not present

## 2017-09-20 DIAGNOSIS — J45909 Unspecified asthma, uncomplicated: Secondary | ICD-10-CM | POA: Diagnosis present

## 2017-09-20 DIAGNOSIS — Z6841 Body Mass Index (BMI) 40.0 and over, adult: Secondary | ICD-10-CM | POA: Diagnosis not present

## 2017-09-22 LAB — CUP PACEART REMOTE DEVICE CHECK
Date Time Interrogation Session: 20190501075411
Implantable Lead Implant Date: 20180312
Implantable Lead Location: 753860
Implantable Lead Model: 293
Implantable Lead Serial Number: 422842
Implantable Pulse Generator Implant Date: 20180312
Lead Channel Setting Pacing Amplitude: 2.5 V
Lead Channel Setting Pacing Pulse Width: 0.4 ms
Lead Channel Setting Sensing Sensitivity: 0.5 mV
Pulse Gen Serial Number: 226601

## 2017-09-30 ENCOUNTER — Encounter: Payer: Self-pay | Admitting: Internal Medicine

## 2017-10-01 MED ORDER — NITROFURANTOIN MONOHYD MACRO 100 MG PO CAPS: 100.0000 mg | ORAL_CAPSULE | Freq: Two times a day (BID) | ORAL | 0 refills | Status: DC

## 2017-10-01 NOTE — Telephone Encounter (Signed)
FYI

## 2017-10-04 ENCOUNTER — Encounter: Payer: Self-pay | Admitting: Internal Medicine

## 2017-10-04 ENCOUNTER — Ambulatory Visit (INDEPENDENT_AMBULATORY_CARE_PROVIDER_SITE_OTHER): Payer: BLUE CROSS/BLUE SHIELD

## 2017-10-04 DIAGNOSIS — I5022 Chronic systolic (congestive) heart failure: Secondary | ICD-10-CM

## 2017-10-04 DIAGNOSIS — Z95810 Presence of automatic (implantable) cardiac defibrillator: Secondary | ICD-10-CM

## 2017-10-05 NOTE — Progress Notes (Signed)
EPIC Encounter for ICM Monitoring  Patient Name: [name] is a 42 y.o. female Date: 10/05/2017 Primary Care Physican: Hoyt Koch, MD Primary Cardiologist: Smith/Bensimhon Electrophysiologist: Faustino Congress Weight: 207lbs       Heart Failure questions reviewed, pt asymptomatic.   10/03/2017 [facility] HF Index: 1    Prescribed dosage: Torsemide 20 mg take 2 tablets (40 mg) by mouth twice daily (Dr Bensimhon's 08/17/17 says to decrease Torsemide to 40 mg daily and may take extra as needed). Potassium 20 mEq 2tablets (40 mEq total) daily.  Labs: 06/11/2017 Creatinine 1.11, BUN 23, Potassium 4.1, Sodium 136, EGFR 68.93 04/30/2017 Creatinine1.11, BUN12, Potassium3.9, Sodium137, EGFR>60  04/29/2017 Creatinine1.01, BUN11, Potassium4.3, Sodium135, EGFR>60  04/28/2017 Creatinine0.78, BUN<5, Potassium4.1, Sodium137, EGFR>60 02/03/2017 Creatinine 0.84, BUN 5, Potassium 3.3, Sodium 135, EGFR >60 01/17/2017 Creatinine 0.76, BUN >5, Potassium 3.2, Sodium 135, EGFR >60 01/07/2017 Creatinine 0.86, BUN 8, Potassium 3.4, Sodium 139, EGFR >60 10/07/2016 Creatinine 0.88, BUN 7, Potassium 4.1, Sodium 137, EGFR >60 07/30/2016 Creatinine 0.95, BUN 13, Potassium 3.8, Sodium 138, EGFR 74-85  06/24/2016 Creatinine 0.93, BUN 8, Potassium 3.5, Sodium 138, EGFR >60  06/22/2016 Creatinine 0.77, BUN 6, Potassium 3.9, Sodium 141, EGFR 96-110  05/28/2016 Creatinine 0.81, BUN 7, Potassium 3.4, Sodium 139  Recommendations: No changes.    Encouraged to call for fluid symptoms.  Follow-up plan: ICM clinic phone appointment on 11/04/2017.  Office appointment scheduled 10/07/2017 with Dr. Haroldine Laws.        Copy of ICM check sent to Dr. Caryl Comes.  3 Month Trend    8 Day Data Trend            [name], RN 10/05/2017 12:42 PM

## 2017-10-07 ENCOUNTER — Ambulatory Visit (HOSPITAL_COMMUNITY)
Admission: RE | Admit: 2017-10-07 | Discharge: 2017-10-07 | Disposition: A | Discharge status: Home / Self Care | Payer: BLUE CROSS/BLUE SHIELD | Source: Ambulatory Visit | Attending: Internal Medicine | Admitting: Internal Medicine

## 2017-10-07 ENCOUNTER — Encounter (HOSPITAL_COMMUNITY): Payer: Self-pay | Admitting: Internal Medicine

## 2017-10-07 VITALS — BP 118/82 | HR 42 | Wt 211.4 lb

## 2017-10-07 DIAGNOSIS — I5042 Chronic combined systolic (congestive) and diastolic (congestive) heart failure: Secondary | ICD-10-CM | POA: Diagnosis not present

## 2017-10-07 DIAGNOSIS — Z6835 Body mass index (BMI) 35.0-35.9, adult: Secondary | ICD-10-CM | POA: Insufficient documentation

## 2017-10-07 DIAGNOSIS — Z86718 Personal history of other venous thrombosis and embolism: Secondary | ICD-10-CM | POA: Diagnosis not present

## 2017-10-07 DIAGNOSIS — F419 Anxiety disorder, unspecified: Secondary | ICD-10-CM | POA: Diagnosis not present

## 2017-10-07 DIAGNOSIS — I110 Hypertensive heart disease with heart failure: Secondary | ICD-10-CM | POA: Insufficient documentation

## 2017-10-07 DIAGNOSIS — J45909 Unspecified asthma, uncomplicated: Secondary | ICD-10-CM | POA: Diagnosis not present

## 2017-10-07 DIAGNOSIS — I10 Essential (primary) hypertension: Secondary | ICD-10-CM | POA: Diagnosis not present

## 2017-10-07 DIAGNOSIS — Z95810 Presence of automatic (implantable) cardiac defibrillator: Secondary | ICD-10-CM | POA: Diagnosis not present

## 2017-10-07 DIAGNOSIS — F329 Major depressive disorder, single episode, unspecified: Secondary | ICD-10-CM | POA: Diagnosis not present

## 2017-10-07 DIAGNOSIS — G4733 Obstructive sleep apnea (adult) (pediatric): Secondary | ICD-10-CM | POA: Diagnosis not present

## 2017-10-07 DIAGNOSIS — G43909 Migraine, unspecified, not intractable, without status migrainosus: Secondary | ICD-10-CM | POA: Insufficient documentation

## 2017-10-07 DIAGNOSIS — E119 Type 2 diabetes mellitus without complications: Secondary | ICD-10-CM | POA: Diagnosis not present

## 2017-10-07 DIAGNOSIS — Z794 Long term (current) use of insulin: Secondary | ICD-10-CM | POA: Insufficient documentation

## 2017-10-07 DIAGNOSIS — I493 Ventricular premature depolarization: Secondary | ICD-10-CM | POA: Insufficient documentation

## 2017-10-07 DIAGNOSIS — K589 Irritable bowel syndrome without diarrhea: Secondary | ICD-10-CM | POA: Diagnosis not present

## 2017-10-07 DIAGNOSIS — Z9884 Bariatric surgery status: Secondary | ICD-10-CM | POA: Insufficient documentation

## 2017-10-07 DIAGNOSIS — Z79899 Other long term (current) drug therapy: Secondary | ICD-10-CM | POA: Insufficient documentation

## 2017-10-07 LAB — COMPREHENSIVE METABOLIC PANEL
ALT: 20 U/L (ref 14–54)
AST: 23 U/L (ref 15–41)
Albumin: 4.3 g/dL (ref 3.5–5.0)
Alkaline Phosphatase: 72 U/L (ref 38–126)
Anion gap: 12 (ref 5–15)
BUN: 18 mg/dL (ref 6–20)
CO2: 28 mmol/L (ref 22–32)
Calcium: 9.8 mg/dL (ref 8.9–10.3)
Chloride: 97 mmol/L — ABNORMAL LOW (ref 101–111)
Creatinine, Ser: 1.05 mg/dL — ABNORMAL HIGH (ref 0.44–1.00)
GFR calc Af Amer: >60 mL/min (ref >60)
GFR calc non Af Amer: >60 mL/min (ref >60)
Glucose, Bld: 101 mg/dL — ABNORMAL HIGH (ref 65–99)
Potassium: 3.4 mmol/L — ABNORMAL LOW (ref 3.5–5.1)
Sodium: 137 mmol/L (ref 135–145)
Total Bilirubin: 0.9 mg/dL (ref 0.3–1.2)
Total Protein: 7.5 g/dL (ref 6.5–8.1)

## 2017-10-07 LAB — MAGNESIUM: Magnesium: 2 mg/dL (ref 1.7–2.4)

## 2017-10-07 MED ORDER — TORSEMIDE 20 MG PO TABS: 20.0000 mg | ORAL_TABLET | ORAL | Status: DC | PRN

## 2017-10-07 MED ORDER — SACUBITRIL-VALSARTAN 49-51 MG PO TABS: 1.0000 | ORAL_TABLET | Freq: Two times a day (BID) | ORAL | 3 refills | Status: DC

## 2017-10-07 MED ORDER — CARVEDILOL 12.5 MG PO TABS: 12.5000 mg | ORAL_TABLET | Freq: Two times a day (BID) | ORAL | 6 refills | Status: DC

## 2017-10-07 NOTE — Patient Instructions (Signed)
Decrease Carvedilol to 12.5 mg Twice daily   Change Torsemide to AS NEEDED ONLY  Take Entresto 49/51 mg Twice daily   Labs done today  Your physician recommends that you schedule a follow-up appointment in: 4 weeks

## 2017-10-07 NOTE — Progress Notes (Signed)
Advanced [facility] Clinic Note Date:  10/07/2017   ID:  [name], DOB 1976-04-29, MRN [id number]  PCP:  Hoyt Koch, MD  Cardiologist: Pernell Dupre, MD  [name] is a 42 y.o. 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   Cath 6/17 revealed normal vessels, moderate elevation in pulmonary artery pressures, and LVEF 25-30%. Echo 8/18 EF 25%  CPX 12/18 pVO2 14.0 (corrects to 23.0 for IBW) VeVCO2  32 RER 1.0  RHC 6/17 RA 5 RV 35/6 PA 41/12 PW 11 Fick 6.4/3.0 PA sat 62%  Echo 6/15 35-40% Echo 6/17 25-30% Echo 12/17 20-25% Echo 8/18 EF 25%  CPX 2/18  FVC 2.57 (79%)    FEV1 2.14 (81%)     FEV1/FVC 83 (101%)     MVV 107 (102%) Resting HR: 106 Peak HR: 166  (93% age predicted max HR)  BP rest: 136/98 BP peak: 174/88 Peak VO2: 17.1 (85% predicted peak VO2) - corrects to 28.4 for ibw VE/VCO2 slope: 33 OUES: 2.16  Peak RER: 1.09 Ventilatory Threshold: 14.6 (73% predicted or measured peak VO2) VE/MVV: 59% PETCO2 at peak: 33 O2pulse: 10  (83% predicted O2pulse)  48 holter in 2/18 2% PVCS  Was admitted 12/5-11/2016 with recurrent HF, URI, CP and elevated BP (160/110). CT chest no PE. Diuresed from 233-> 228 pounds.   She presents today for post-op follow up of gastric sleeve. At last visit torsemide decrease to 40 mg daily. S/p Gastric sleeve at [facility] 09/20/17. She is down 19 lbs from her most recent visit. Still not eating much. Doing sugar free pop-sicles, broths, soups, jello, and water. Biggest complaint right now is fatigue. She feels week and lightheaded when she stands up too fast, especially after her second doses medicines.No edema, orthopnea or PND. Markedly decreased appetite. Broke her CPAP and is waiting to get another hose. Weight at home down  to 209-212. Taking all medication as directed.   Review of systems complete and found to be negative unless listed in HPI.    [place] 3/19: RA = 2 RV = 26/7 PA = 28/13 (19) PCW = 5 Fick cardiac output/index = 5.7/2.8 PVR = 2.5 WU Ao sat = 99% PA sat = 71%, 72% High SVC sat = 65%   Past Medical History:  Diagnosis Date  . AICD (automatic cardioverter/defibrillator) present   . Anxiety   . Arthritis    right shoulder   . Asthma   . CHF (congestive heart failure) ([place])   . Depression   . Diabetes mellitus without complication ([place])   . Diverticulosis   . Dyspnea    comes and goes intermittently mostly with exertion   . Fibroid    age 34  . Gallstones   . Hypertension   . IBS (irritable bowel syndrome)   . Migraine    "monthly" (08/03/2016)  . OSA on CPAP   . Ovarian cyst    1999; surgically removed  . 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 Seizures ([place])    "as a child" (08/03/2016)  . Termination of pregnancy    due to cardiac risk    Past Surgical History:  Procedure Laterality Date  . CARDIAC CATHETERIZATION N/A 11/11/2015   Procedure: Right/Left Heart Cath and Coronary Angiography;  Surgeon: Troy Sine, MD;  Location:  [place] INVASIVE CV LAB;  Service: Cardiovascular;  Laterality: N/A;  . CARDIAC CATHETERIZATION  ~ 2015  . CARDIAC DEFIBRILLATOR PLACEMENT  08/03/2016  . CESAREAN SECTION  1999  . COLONOSCOPY WITH PROPOFOL N/A 04/21/2016   Procedure: COLONOSCOPY WITH PROPOFOL;  Surgeon: Jerene Bears, MD;  Location: [facility] ENDOSCOPY;  Service: Gastroenterology;  Laterality: N/A;  . ESOPHAGOGASTRODUODENOSCOPY (EGD) WITH PROPOFOL N/A 04/21/2016   Procedure: ESOPHAGOGASTRODUODENOSCOPY (EGD) WITH PROPOFOL;  Surgeon: Jerene Bears, MD;  Location: [facility] ENDOSCOPY;  Service: Gastroenterology;  Laterality: N/A;  . FOOT FRACTURE SURGERY Right ~ 2003  . FRACTURE SURGERY    . ICD IMPLANT N/A 08/03/2016   Procedure: ICD Implant;  Surgeon: Deboraha Sprang, MD;   Location: [place] CV LAB;  Service: Cardiovascular;  Laterality: N/A;  . LAPAROSCOPIC CHOLECYSTECTOMY  12/2006  . LAPAROSCOPY ABDOMEN DIAGNOSTIC  2008   "cut bile duct w/gallbladder OR; had to go in later & fix leak; hospitalized for 2 months"  . LEFT HEART CATHETERIZATION WITH CORONARY ANGIOGRAM N/A 02/26/2014   Procedure: LEFT HEART CATHETERIZATION WITH CORONARY ANGIOGRAM;  Surgeon: Jettie Booze, MD;  Location: [facility] CATH LAB;  Service: Cardiovascular;  Laterality: N/A;  . OVARIAN CYST REMOVAL Right 1999  . RIGHT HEART CATH N/A 08/05/2017   Procedure: RIGHT HEART CATH;  Surgeon: Jolaine Artist, MD;  Location: [place] CV LAB;  Service: Cardiovascular;  Laterality: N/A;  . TUBAL LIGATION  1999    Current Medications:  Current Meds  Medication Sig  . albuterol (PROVENTIL HFA;VENTOLIN HFA) 108 (90 Base) MCG/ACT inhaler Inhale 1-2 puffs into the lungs every 6 (six) hours as needed for wheezing or shortness of breath. **PT NEEDS TO ESTABLISH WITH ANOTHER PROVIDER FOR ADDITIONAL REFILLS** (Patient taking differently: Inhale 1-2 puffs into the lungs every 6 (six) hours as needed for wheezing or shortness of breath. )  . B-D ULTRAFINE III SHORT PEN 31G X 8 MM MISC USE TO TEST BLOOD SUGARS 1-2 TIMES A DAY AS NEEDED.  [name] Biotin 5000 MCG TABS Take 5,000 mcg by mouth daily.  . blood glucose meter kit and supplies Dispense based on patient and insurance preference. Use up to two times daily as directed. (FOR ICD-10 E10.9, E11.9).  . budesonide-formoterol (SYMBICORT) 160-4.5 MCG/ACT inhaler Inhale 2 puffs into the lungs 2 (two) times daily. (Patient taking differently: Inhale 2 puffs into the lungs 2 (two) times daily as needed. )  . carvedilol (COREG) 25 MG tablet Take 25 mg by mouth 2 (two) times daily with a meal.  . colestipol (COLESTID) 1 g tablet Take 2 tablets (2 g total) by mouth 2 (two) times daily.  . cyclobenzaprine (FLEXERIL) 10 MG tablet Take 1 tablet (10 mg total) by mouth 3  (three) times daily as needed for muscle spasms.  . Diclofenac Sodium 2 % SOLN Apply 1 Pump topically 2 (two) times daily as needed (right shoulder and neck pain).   [name] dicyclomine (BENTYL) 20 MG tablet Take 20 mg by mouth 2 (two) times daily as needed for spasms.  [name] gabapentin (NEURONTIN) 100 MG capsule Take 200 mg by mouth at bedtime as needed (for pain).   . Ibuprofen-Famotidine 800-26.6 MG TABS Take 1 tablet by mouth 3 (three) times daily as needed (for pain and/or swelling).  . Insulin Pen Needle (NOVOFINE) 32G X 6 MM MISC Use to administer victoza every day  . ivabradine (CORLANOR) 7.5 MG TABS tablet Take 1 tablet (7.5 mg total) by mouth 2 (two) times daily with a meal.  .  Multiple Vitamins-Minerals (MULTIVITAMIN GUMMIES WOMENS PO) Take 2 each by mouth daily.  . potassium chloride SA (K-DUR,KLOR-CON) 20 MEQ tablet Take 20 mEq by mouth daily.  . sacubitril-valsartan (ENTRESTO) 97-103 MG Take 1 tablet 2 (two) times daily by mouth.  . spironolactone (ALDACTONE) 25 MG tablet Take 25 mg by mouth daily.  [name] torsemide (DEMADEX) 20 MG tablet Take 20 mg by mouth daily.  . Vitamin D, Ergocalciferol, (DRISDOL) 50000 units CAPS capsule TAKE 1 CAPSULE (50,000 UNITS TOTAL) BY MOUTH EVERY 7 (SEVEN) DAYS.  [name] zolpidem (AMBIEN) 5 MG tablet Take 1 tablet (5 mg total) by mouth at bedtime as needed for sleep.     Allergies:   Vancomycin; Contrast media [iodinated diagnostic agents]; Avocado; Aspirin; Ciprofloxacin; and Sulfa antibiotics   Social History   Socioeconomic History  . Marital status: Married    Spouse name: Not on file  . Number of children: 1  . Years of education: Not on file  . Highest education level: Not on file  Occupational History  . Occupation: stay at home mom  Social Needs  . Financial resource strain: Not on file  . Food insecurity:    Worry: Not on file    Inability: Not on file  . Transportation needs:    Medical: Not on file    Non-medical: Not on file  Tobacco Use  .  Smoking status: Never Smoker  . Smokeless tobacco: Never Used  Substance and Sexual Activity  . Alcohol use: No  . Drug use: No  . Sexual activity: Yes  Lifestyle  . Physical activity:    Days per week: Not on file    Minutes per session: Not on file  . Stress: Not on file  Relationships  . Social connections:    Talks on phone: Not on file    Gets together: Not on file    Attends religious service: Not on file    Active member of club or organization: Not on file    Attends meetings of clubs or organizations: Not on file    Relationship status: Not on file  Other Topics Concern  . Not on file  Social History Narrative  . Not on file     Family History:  The patient's family history includes Allergies in her daughter; Emphysema in her maternal grandmother; Heart disease (age of onset: 66) in her maternal grandmother; Rheum arthritis in her mother.   Vitals:   10/07/17 0946  BP: 118/82  Pulse: (!) 42  SpO2: 97%  Weight: 211 lb 6.4 oz (95.9 kg)     Wt Readings from Last 3 Encounters:  10/07/17 211 lb 6.4 oz (95.9 kg)  08/17/17 230 lb (104.3 kg)  08/05/17 222 lb (100.7 kg)   EKG: NSR 89 bpm with frequent PVCs  Physical Exam  General:  Well appearing. No resp difficulty HEENT: normal Neck: supple. no JVD. Carotids 2+ bilat; no bruits. No lymphadenopathy or thryomegaly appreciated. Cor: PMI nondisplaced. Regular rate & rhythm.Frequent PVCs No rubs, gallops or murmurs. Lungs: clear Abdomen: soft, nontender, nondistended. No hepatosplenomegaly. No bruits or masses. Good bowel sounds. Extremities: no cyanosis, clubbing, rash, edema Neuro: alert & orientedx3, cranial nerves grossly intact. moves all 4 extremities w/o difficulty. Affect pleasant   Assessment and Plan  1. Chronic systolic HF:  due to peri-partum CM, onset 1999. S/P [employer] ICD 07/2016 - EF 20-25% (12/17). Echo 8/18 EF 25-30% s/p ICD 3/18. [place] 3/19: RA 2, PA 28/13, Fick CO 5.7, CI 2.8 -  CPX 12/18  showed moderate limitation due mostly to obesity with some HF component - NYHA II - Volume status dry.    - Stop scheduled torsemide. Change to torsemide 40 mg as needed.  - Change Entresto to 49/51 mg BID.  - Decrease Coreg to 12.5 mg BID   - Continue Spiro 25 mg daily.  - Continue corlanor 7.54m BID for now.  2. Morbid [facility]  - Body mass index is 35.73 kg/m.  3. OSA -Compliant with CPAP. Needs new hose. (Though weight loss may be curative)  - Likely to improve with weight loss.  4. Anxiety/depression - Per PCP.  5. DM2 - A1c in Jan. 2018 is 5.3 with medications.  - Per PCP. Consider Jardiance.  6. HTN - Meds as above.   CMET today. Cut back meds with less po intake and weight loss. May be able to adjust back up in the future as tolerated. RTC 4 weeks. Sooner with symptoms.   MShirley Friar PA-C  10/07/2017 9:57 AM    Patient seen and examined with the above-signed Advanced Practice Provider and/or Housestaff. I personally reviewed laboratory data, imaging studies and relevant notes. I independently examined the patient and formulated the important aspects of the plan. I have edited the note to reflect any of my changes or salient points. I have personally discussed the plan with the patient and/or family.  Doing well post gastric sleeve. Suspect she is overdiuresed. Agree with med changes as above. Having frequent PVCs. Will check electrolytes. F/u in 4 weeks.   DGlori Bickers MD  3:14 PM

## 2017-10-12 ENCOUNTER — Telehealth (HOSPITAL_COMMUNITY): Payer: Self-pay | Admitting: *Deleted

## 2017-10-12 NOTE — Telephone Encounter (Signed)
Result Notes for Comprehensive Metabolic Panel (CMET)   Notes recorded by Darron Doom, RN on 10/12/2017 at 12:23 PM EDT Patient called back and she is agreeable with plan. No further questions. ------  Notes recorded by [name], RN on 10/12/2017 at 12:21 PM EDT Left message to call back ------  Notes recorded by Bensimhon, Shaune Pascal, MD on 10/07/2017 at 10:37 PM EDT Please have her take kcl 40 x 1

## 2017-10-14 DIAGNOSIS — Z903 Acquired absence of stomach [part of]: Secondary | ICD-10-CM | POA: Diagnosis not present

## 2017-10-14 DIAGNOSIS — Z6835 Body mass index (BMI) 35.0-35.9, adult: Secondary | ICD-10-CM | POA: Diagnosis not present

## 2017-10-14 DIAGNOSIS — Z713 Dietary counseling and surveillance: Secondary | ICD-10-CM | POA: Diagnosis not present

## 2017-10-14 DIAGNOSIS — E669 Obesity, unspecified: Secondary | ICD-10-CM | POA: Diagnosis not present

## 2017-10-16 ENCOUNTER — Encounter: Payer: Self-pay | Admitting: Family Medicine

## 2017-10-19 ENCOUNTER — Ambulatory Visit (INDEPENDENT_AMBULATORY_CARE_PROVIDER_SITE_OTHER): Payer: BLUE CROSS/BLUE SHIELD | Admitting: Internal Medicine

## 2017-10-19 ENCOUNTER — Encounter: Payer: Self-pay | Admitting: Internal Medicine

## 2017-10-19 VITALS — BP 112/74 | HR 68 | Ht 64.5 in | Wt 211.5 lb

## 2017-10-19 DIAGNOSIS — K5900 Constipation, unspecified: Secondary | ICD-10-CM | POA: Diagnosis not present

## 2017-10-19 DIAGNOSIS — Z903 Acquired absence of stomach [part of]: Secondary | ICD-10-CM | POA: Diagnosis not present

## 2017-10-19 MED ORDER — LINACLOTIDE 145 MCG PO CAPS: 145.0000 ug | ORAL_CAPSULE | Freq: Every day | ORAL | 3 refills | Status: DC

## 2017-10-19 NOTE — Progress Notes (Signed)
Subjective:    Patient ID: [name], female    DOB: 03-25-1976, 42 y.o.   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  At the time of her last visit we titrated her colestipol for her bile salt diarrhea.  She also underwent gastric sleeve with Dr. Toney Rakes at [facility] on 09/20/2017.  She recovered well from this and used codeine for a short amount of time and diet has been slowly advance from liquids to pured and now soft foods.  Since surgery, actually since switching from liquid to pured diet she has had severe constipation.  She is completely off colestipol therapy.  She is having a bowel movement every 5 to 8 days.  When she has been a week without bowel movement she gets lower abdominal fullness pressure and discomfort.  Stools are hard and difficult to pass and she has seen red blood with wiping.  Dr. Toney Rakes suggested she use senna with Colace which she is been doing twice daily with no improvement.  She is using magnesium citrate every 3 to 5 days to help stimulate bowel movement.  She has a hard time drinking the entire bottle of magnesium citrate after gastric sleeve and so she is taking 3 to 4 ounces at a time.  Even with mag citrate bowel movements are infrequent.  Of note, when diarrhea was a problem rifaximin did not seem to help.  Review of Systems As per HPI, otherwise negative  Current Medications, Allergies, Past Medical History, Past Surgical History, Family History and Social History were reviewed in [employer] record.     Objective:   Physical Exam BP 112/74   Pulse 68   Ht 5' 4.5" (1.638 m)   Wt 211 lb 8 oz (95.9 kg)   BMI 35.74 kg/m  Constitutional: Well-developed and well-nourished. No distress. HEENT: Normocephalic and  atraumatic.  Conjunctivae are normal.  No scleral icterus. Neck: Neck supple. Trachea midline. Cardiovascular: Normal rate, regular rhythm and intact distal pulses. No M/R/G Pulmonary/chest: Effort normal and breath sounds normal. No wheezing, rales or rhonchi. Abdominal: Soft, nontender, nondistended. Bowel sounds active throughout. There are no masses palpable. No hepatosplenomegaly. Extremities: no clubbing, cyanosis, or edema Neurological: Alert and oriented to person place and time. Skin: Skin is warm and dry. Psychiatric: Normal mood and affect. Behavior is normal.      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   1.  History of chronic diarrhea/recent gastric sleeve/now constipation --she has had a change in bowel habits since having her gastric sleeve placed.  Her chronic diarrhea has resolved and in fact she is now quite constipated.  This is persistent despite senna plus docusate.  She has had a previous normal colonoscopy which was performed in November 2017. --I recommended we discontinue senna and Colace given in efficacy and begin Linzess 145 mcg daily.  Take 1 capsule 30 minutes before breakfast.  If not improving after 3 to 5 days she can increase to 290 mcg daily.  If after 10 days no improvement, she is asked to notify me.  She voices understanding.  15 minutes spent with the  patient today. Greater than 50% was spent in counseling and coordination of care with the patient

## 2017-10-19 NOTE — Patient Instructions (Signed)
We have sent the following medications to your pharmacy for you to pick up at your convenience: Linzess 145 mcg daily- if this is not effective after 4-5 days, you may double this dosage  Discontinue Senna.  If you are age 44 or older, your body mass index should be between 23-30. Your Body mass index is 35.74 kg/m. If this is out of the aforementioned range listed, please consider follow up with your Primary Care Provider.  If you are age 44 or younger, your body mass index should be between 19-25. Your Body mass index is 35.74 kg/m. If this is out of the aformentioned range listed, please consider follow up with your Primary Care Provider.

## 2017-10-21 ENCOUNTER — Encounter: Payer: Self-pay | Admitting: Family Medicine

## 2017-10-21 ENCOUNTER — Ambulatory Visit (INDEPENDENT_AMBULATORY_CARE_PROVIDER_SITE_OTHER): Payer: Medicare Other | Admitting: Family Medicine

## 2017-10-21 ENCOUNTER — Ambulatory Visit: Payer: Medicare Other | Admitting: Family Medicine

## 2017-10-21 ENCOUNTER — Telehealth: Payer: Self-pay | Admitting: Family Medicine

## 2017-10-21 VITALS — BP 102/80 | HR 48 | Temp 97.6°F | Wt 213.0 lb

## 2017-10-21 DIAGNOSIS — R300 Dysuria: Secondary | ICD-10-CM | POA: Diagnosis not present

## 2017-10-21 DIAGNOSIS — N3090 Cystitis, unspecified without hematuria: Secondary | ICD-10-CM

## 2017-10-21 HISTORY — DX: Cystitis, unspecified without hematuria: N30.90

## 2017-10-21 LAB — POC URINALSYSI DIPSTICK (AUTOMATED)
Bilirubin, UA: NEGATIVE
Glucose, UA: NEGATIVE
Nitrite, UA: NEGATIVE
Protein, UA: POSITIVE — AB
Spec Grav, UA: 1.02 (ref 1.010–1.025)
Urobilinogen, UA: 1 E.U./dL
pH, UA: 6 (ref 5.0–8.0)

## 2017-10-21 MED ORDER — FLUCONAZOLE 150 MG PO TABS: 150.0000 mg | ORAL_TABLET | Freq: Once | ORAL | 0 refills | Status: AC

## 2017-10-21 MED ORDER — NITROFURANTOIN MONOHYD MACRO 100 MG PO CAPS: 100.0000 mg | ORAL_CAPSULE | Freq: Two times a day (BID) | ORAL | 0 refills | Status: AC

## 2017-10-21 NOTE — Telephone Encounter (Signed)
Ordered

## 2017-10-21 NOTE — Assessment & Plan Note (Signed)
Urinalysis    Component Value Date/Time   COLORURINE YELLOW 05/28/2016 1202   APPEARANCEUR Cloudy (A) 05/28/2016 1202   LABSPEC >=1.030 (A) 05/28/2016 1202   PHURINE 6.0 05/28/2016 1202   GLUCOSEU NEGATIVE 05/28/2016 1202   HGBUR TRACE-INTACT (A) 05/28/2016 1202   BILIRUBINUR neg 10/21/2017 1325   KETONESUR NEGATIVE 05/28/2016 1202   PROTEINUR Positive (A) 10/21/2017 1325   PROTEINUR NEGATIVE 03/01/2016 1020   UROBILINOGEN 1.0 10/21/2017 1325   UROBILINOGEN 0.2 05/28/2016 1202   NITRITE neg 10/21/2017 1325   NITRITE NEGATIVE 05/28/2016 1202   LEUKOCYTESUR Large (3+) (A) 10/21/2017 1325  UA results and symptoms consistent with UTI, will treat with macrobid x7 days.  Urine sent for culture and will adjust antibiotics as needed once these results return.

## 2017-10-21 NOTE — Addendum Note (Signed)
Addended by: Perlie Mayo on: 10/21/2017 03:26 PM   Modules accepted: Orders

## 2017-10-21 NOTE — Patient Instructions (Signed)

## 2017-10-21 NOTE — Telephone Encounter (Signed)
Copied from [place] [id number]. Topic: Quick Communication - See Telephone Encounter >> Oct 21, 2017  2:23 PM Ether Griffins B wrote: CRM for notification. See Telephone encounter for: 10/21/17.  Pt was just in the office with Dr. Zigmund Daniel and only one medication was called in the pharmacy doenst have the diflucan. Please send to [id number] - [place], [place]

## 2017-10-21 NOTE — Progress Notes (Signed)
[name] - 42 y.o. female MRN [id number]  Date of birth: 05-31-1975  Subjective Chief Complaint  Patient presents with  . Urinary Tract Infection    HPI [name] is 42 y.o. female here today for same day visit with UTI symptoms.  She reports that she has had symptoms of dysuria, odor and dark coloration to urine x2 days.  Has had UTI in the past and symptoms feel similar.  She denies fever, chills, flank pain, nausea or vaginal discharge.  She has tried increasing fluid intake.  ROS:  ROS completed and negative except as noted per HPI.   Allergies  Allergen Reactions  . Vancomycin Other (See Comments)    "did something to my kidneys," PROGRESSED TO KIDNEY FAILURE!!  . Contrast Media [Iodinated Diagnostic Agents] Other (See Comments)    Multiple CT contrast studies done over 2 weeks caused ARF  . Avocado Swelling    Causes swelling and itching  . Aspirin Other (See Comments)    Wheezing, "but patient still takes if she needs to"  . Ciprofloxacin Itching and Rash  . Sulfa Antibiotics Itching and Rash    Past Medical History:  Diagnosis Date  . AICD (automatic cardioverter/defibrillator) present   . Anxiety   . Arthritis    right shoulder   . Asthma   . CHF (congestive heart failure) ([place])   . Depression   . Diabetes mellitus without complication ([place])   . Diverticulosis   . Dyspnea    comes and goes intermittently mostly with exertion   . Fibroid    age 77  . Gallstones   . Hypertension   . IBS (irritable bowel syndrome)   . Migraine    "monthly" (08/03/2016)  . OSA on CPAP   . Ovarian cyst    1999; surgically removed  . 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 Seizures ([place])    "as a child" (08/03/2016)  . Termination of pregnancy    due to cardiac risk    Past Surgical History:  Procedure Laterality Date  . CARDIAC CATHETERIZATION N/A 11/11/2015   Procedure: Right/Left Heart Cath and Coronary Angiography;  Surgeon: Troy Sine, MD;  Location: [facility];  Service: Cardiovascular;  Laterality: N/A;  . CARDIAC CATHETERIZATION  ~ 2015  . CARDIAC DEFIBRILLATOR PLACEMENT  08/03/2016  . CESAREAN SECTION  1999  . COLONOSCOPY WITH PROPOFOL N/A 04/21/2016   Procedure: COLONOSCOPY WITH PROPOFOL;  Surgeon: Jerene Bears, MD;  [facility];  Service: Gastroenterology;  Laterality: N/A;  . ESOPHAGOGASTRODUODENOSCOPY (EGD) WITH PROPOFOL N/A 04/21/2016   Procedure: ESOPHAGOGASTRODUODENOSCOPY (EGD) WITH PROPOFOL;  Surgeon: Jerene Bears, MD;  [facility];  Service: Gastroenterology;  Laterality: N/A;  . FOOT FRACTURE SURGERY Right ~ 2003  . FRACTURE SURGERY    . ICD IMPLANT N/A 08/03/2016   Procedure: ICD Implant;  Surgeon: Deboraha Sprang, MD;  Location: [place] [facility];  Service: Cardiovascular;  Laterality: N/A;  . LAPAROSCOPIC CHOLECYSTECTOMY  12/2006  . LAPAROSCOPIC GASTRIC SLEEVE RESECTION    . LAPAROSCOPY ABDOMEN DIAGNOSTIC  2008   "cut bile duct w/gallbladder OR; had to go in later & fix leak; hospitalized for 2 months"  . LEFT HEART CATHETERIZATION WITH CORONARY ANGIOGRAM N/A 02/26/2014   Procedure: LEFT HEART CATHETERIZATION WITH CORONARY ANGIOGRAM;  Surgeon: Jettie Booze, MD;  [facility]: [facility];  Service: Cardiovascular;  Laterality: N/A;  . OVARIAN CYST REMOVAL Right 1999  . RIGHT  HEART CATH N/A 08/05/2017   Procedure: RIGHT HEART CATH;  Surgeon: Jolaine Artist, MD;  Location: [place] [facility];  Service: Cardiovascular;  Laterality: N/A;  . TUBAL LIGATION  1999    Social History   Socioeconomic History  . Marital status: Married    Spouse name: Not on file  . Number of children: 1  . Years of education: Not on file  . Highest education level: Not on file  Occupational History  . Occupation: stay at home mom  Social Needs  . Financial resource strain: Not on file  . Food insecurity:    Worry: Not on file    Inability: Not on file  . Transportation needs:      Medical: Not on file    Non-medical: Not on file  Tobacco Use  . Smoking status: Never Smoker  . Smokeless tobacco: Never Used  Substance and Sexual Activity  . Alcohol use: No  . Drug use: No  . Sexual activity: Yes  Lifestyle  . Physical activity:    Days per week: Not on file    Minutes per session: Not on file  . Stress: Not on file  Relationships  . Social connections:    Talks on phone: Not on file    Gets together: Not on file    Attends religious service: Not on file    Active member of club or organization: Not on file    Attends meetings of clubs or organizations: Not on file    Relationship status: Not on file  Other Topics Concern  . Not on file  Social History Narrative  . Not on file    Family History  Problem Relation Age of Onset  . Emphysema Maternal Grandmother        smoked  . Heart disease Maternal Grandmother 47       MI  . Rheum arthritis Mother   . Allergies Daughter   . Colon cancer Neg Hx     Health Maintenance  Topic Date Due  . OPHTHALMOLOGY EXAM  03/31/1984  . URINE MICROALBUMIN  03/31/1984  . PAP SMEAR  07/31/2013  . HEMOGLOBIN A1C  11/25/2016  . INFLUENZA VACCINE  12/23/2017  . FOOT EXAM  06/11/2018  . PNEUMOCOCCAL POLYSACCHARIDE VACCINE (2) 02/23/2019  . TETANUS/TDAP  02/16/2023  . HIV Screening  Completed    ----------------------------------------------------------------------------------------------------------------------------------------------------------------------------------------------------------------- Physical Exam BP 102/80   Pulse (!) 48   Temp 97.6 F (36.4 C) (Oral)   Wt 213 lb (96.6 kg)   BMI 36.00 kg/m   Physical Exam  Constitutional: She is oriented to person, place, and time. She appears well-nourished. No distress.  HENT:  Head: Normocephalic and atraumatic.  Mouth/Throat: Oropharynx is clear and moist.  Eyes: Conjunctivae are normal. No scleral icterus.  Cardiovascular: Normal rate and  regular rhythm.  Pulmonary/Chest: Effort normal and breath sounds normal.  Abdominal: Soft. Bowel sounds are normal. She exhibits no distension. There is no tenderness. There is no guarding.  No CVA tenderness  Neurological: She is alert and oriented to person, place, and time.  Skin: Skin is warm and dry. No rash noted.  Psychiatric: She has a normal mood and affect. Her behavior is normal.    ------------------------------------------------------------------------------------------------------------------------------------------------------------------------------------------------------------------- Assessment and Plan  Cystitis Urinalysis    Component Value Date/Time   COLORURINE YELLOW 05/28/2016 1202   APPEARANCEUR Cloudy (A) 05/28/2016 1202   LABSPEC >=1.030 (A) 05/28/2016 1202   PHURINE 6.0 05/28/2016 1202   GLUCOSEU NEGATIVE 05/28/2016 1202  HGBUR TRACE-INTACT (A) 05/28/2016 1202   BILIRUBINUR neg 10/21/2017 1325   KETONESUR NEGATIVE 05/28/2016 1202   PROTEINUR Positive (A) 10/21/2017 1325   PROTEINUR NEGATIVE 03/01/2016 1020   UROBILINOGEN 1.0 10/21/2017 1325   UROBILINOGEN 0.2 05/28/2016 1202   NITRITE neg 10/21/2017 1325   NITRITE NEGATIVE 05/28/2016 1202   LEUKOCYTESUR Large (3+) (A) 10/21/2017 1325  UA results and symptoms consistent with UTI, will treat with macrobid x7 days.  Urine sent for culture and will adjust antibiotics as needed once these results return.

## 2017-10-23 LAB — URINE CULTURE
MICRO NUMBER:: 90652514
SPECIMEN QUALITY:: ADEQUATE

## 2017-10-25 NOTE — Progress Notes (Signed)
Current antibiotic appropriate, she should complete course.

## 2017-10-29 ENCOUNTER — Other Ambulatory Visit (HOSPITAL_COMMUNITY): Payer: Self-pay | Admitting: Internal Medicine

## 2017-11-03 NOTE — Progress Notes (Signed)
Advanced Heart Failure Clinic Note Date:  11/04/2017  ID:  [name], DOB 1976-05-16, MRN [id number]  PCP:  Hoyt Koch, MD  Cardiologist: Pernell Dupre, MD HF: Dr Haroldine Laws  [name] is a 42 y.o. 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   Cath 6/17 revealed normal vessels, moderate elevation in pulmonary artery pressures, and LVEF 25-30%. Echo 8/18 EF 25%  CPX 12/18 pVO2 14.0 (corrects to 23.0 for IBW) VeVCO2  32 RER 1.0  RHC 6/17 RA 5 RV 35/6 PA 41/12 PW 11 Fick 6.4/3.0 PA sat 62%  Echo 6/15 35-40% Echo 6/17 25-30% Echo 12/17 20-25% Echo 8/18 EF 25%  CPX 2/18  FVC 2.57 (79%)    FEV1 2.14 (81%)     FEV1/FVC 83 (101%)     MVV 107 (102%) Resting HR: 106 Peak HR: 166  (93% age predicted max HR)  BP rest: 136/98 BP peak: 174/88 Peak VO2: 17.1 (85% predicted peak VO2) - corrects to 28.4 for ibw VE/VCO2 slope: 33 OUES: 2.16  Peak RER: 1.09 Ventilatory Threshold: 14.6 (73% predicted or measured peak VO2) VE/MVV: 59% PETCO2 at peak: 33 O2pulse: 10  (83% predicted O2pulse)  48 holter in 2/18 2% PVCS  Was admitted 12/5-11/2016 with recurrent HF, URI, CP and elevated BP (160/110). CT chest no PE. Diuresed from 233-> 228 pounds.   She presents today for regular follow up. She recently has gastric sleeve surgery 09/20/17. Last visit, coreg and entresto were decreased and torsemide was changed from scheduled to PRN. Overall doing well. She is down 30 lbs total. She can still only take bites and sips, but is able to eat regular food now. SOB has improved. She did a lot of walking in [place] with no SOB. She does get fatigued easily. Denies orthopnea, PND, edema. Not wearing her CPAP because she needs a new hose, but is going to work on this. Denies dizziness. She  has palpitations at night, but they have improved since starting corlanor. Taking all medications. She took torsemide twice last week for edema. Weights stable at home ~212 lbs.   ICD interrogated: HeartLogic score 23. Thoracic impedence is down to 49. Active 1.5 hours daily.   Review of systems complete and found to be negative unless listed in HPI.   [place] 3/19: RA = 2 RV = 26/7 PA = 28/13 (19) PCW = 5 Fick cardiac output/index = 5.7/2.8 PVR = 2.5 WU Ao sat = 99% PA sat = 71%, 72% High SVC sat = 65%   Past Medical History:  Diagnosis Date  . AICD (automatic cardioverter/defibrillator) present   . Anxiety   . Arthritis    right shoulder   . Asthma   . CHF (congestive heart failure) ([place])   . Depression   . Diabetes mellitus without complication ([place])   . Diverticulosis   . Dyspnea    comes and goes intermittently mostly with exertion   . Fibroid    age 65  . Gallstones   . Hypertension   . IBS (irritable bowel syndrome)   . Migraine    "monthly" (08/03/2016)  . OSA on CPAP   . Ovarian cyst    1999; surgically removed  . 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 Seizures ([place])    "as a child" (08/03/2016)  .  Termination of pregnancy    due to cardiac risk    Past Surgical History:  Procedure Laterality Date  . CARDIAC CATHETERIZATION N/A 11/11/2015   Procedure: Right/Left Heart Cath and Coronary Angiography;  Surgeon: Troy Sine, MD;  Location: [facility];  Service: Cardiovascular;  Laterality: N/A;  . CARDIAC CATHETERIZATION  ~ 2015  . CARDIAC DEFIBRILLATOR PLACEMENT  08/03/2016  . CESAREAN SECTION  1999  . COLONOSCOPY WITH PROPOFOL N/A 04/21/2016   Procedure: COLONOSCOPY WITH PROPOFOL;  Surgeon: Jerene Bears, MD;  [facility];  Service: Gastroenterology;  Laterality: N/A;  . ESOPHAGOGASTRODUODENOSCOPY (EGD) WITH PROPOFOL N/A 04/21/2016   Procedure: ESOPHAGOGASTRODUODENOSCOPY (EGD) WITH PROPOFOL;  Surgeon: Jerene Bears,  MD;  [facility];  Service: Gastroenterology;  Laterality: N/A;  . FOOT FRACTURE SURGERY Right ~ 2003  . FRACTURE SURGERY    . ICD IMPLANT N/A 08/03/2016   Procedure: ICD Implant;  Surgeon: Deboraha Sprang, MD;  Location: [place] [facility];  Service: Cardiovascular;  Laterality: N/A;  . LAPAROSCOPIC CHOLECYSTECTOMY  12/2006  . LAPAROSCOPIC GASTRIC SLEEVE RESECTION    . LAPAROSCOPY ABDOMEN DIAGNOSTIC  2008   "cut bile duct w/gallbladder OR; had to go in later & fix leak; hospitalized for 2 months"  . LEFT HEART CATHETERIZATION WITH CORONARY ANGIOGRAM N/A 02/26/2014   Procedure: LEFT HEART CATHETERIZATION WITH CORONARY ANGIOGRAM;  Surgeon: Jettie Booze, MD;  Location: [facility];  Service: Cardiovascular;  Laterality: N/A;  . OVARIAN CYST REMOVAL Right 1999  . RIGHT HEART CATH N/A 08/05/2017   Procedure: RIGHT HEART CATH;  Surgeon: Jolaine Artist, MD;  Location: [place] [facility];  Service: Cardiovascular;  Laterality: N/A;  . TUBAL LIGATION  1999    Current Medications:  Current Meds  Medication Sig  . albuterol (PROVENTIL HFA;VENTOLIN HFA) 108 (90 Base) MCG/ACT inhaler Inhale 1-2 puffs into the lungs every 6 (six) hours as needed for wheezing or shortness of breath. **PT NEEDS TO ESTABLISH WITH ANOTHER PROVIDER FOR ADDITIONAL REFILLS** (Patient taking differently: Inhale 1-2 puffs into the lungs every 6 (six) hours as needed for wheezing or shortness of breath. )  . blood glucose meter kit and supplies Dispense based on patient and insurance preference. Use up to two times daily as directed. (FOR ICD-10 E10.9, E11.9).  . carvedilol (COREG) 12.5 MG tablet Take 1 tablet (12.5 mg total) by mouth 2 (two) times daily with a meal.  . CORLANOR 7.5 MG TABS tablet TAKE 1 TABLET (7.5 MG TOTAL) BY MOUTH 2 (TWO) TIMES DAILY WITH A MEAL.  . cyclobenzaprine (FLEXERIL) 10 MG tablet Take 1 tablet (10 mg total) by mouth 3 (three) times daily as needed for muscle spasms.  . Diclofenac  Sodium 2 % SOLN Apply 1 Pump topically 2 (two) times daily as needed (right shoulder and neck pain).   Marland Kitchen dicyclomine (BENTYL) 20 MG tablet Take 20 mg by mouth 2 (two) times daily as needed for spasms.  Marland Kitchen gabapentin (NEURONTIN) 100 MG capsule Take 200 mg by mouth at bedtime as needed (for pain).   . Insulin Pen Needle (NOVOFINE) 32G X 6 MM MISC Use to administer victoza every day  . linaclotide (LINZESS) 145 MCG CAPS capsule Take 1 capsule (145 mcg total) by mouth daily before breakfast.  . Multiple Vitamins-Minerals (MULTIVITAMIN GUMMIES WOMENS PO) Take 2 each by mouth daily.  . nitroGLYCERIN (NITROSTAT) 0.4 MG SL tablet Place 1 tablet (0.4 mg total) under the tongue every 5 (five) minutes as needed  for chest pain.  . potassium chloride SA (K-DUR,KLOR-CON) 20 MEQ tablet Take 20 mEq by mouth daily.  . sacubitril-valsartan (ENTRESTO) 49-51 MG Take 1 tablet by mouth 2 (two) times daily.  Marland Kitchen spironolactone (ALDACTONE) 25 MG tablet Take 25 mg by mouth daily.  Marland Kitchen torsemide (DEMADEX) 20 MG tablet Take 1 tablet (20 mg total) by mouth as needed.  . Vitamin D, Ergocalciferol, (DRISDOL) 50000 units CAPS capsule TAKE 1 CAPSULE (50,000 UNITS TOTAL) BY MOUTH EVERY 7 (SEVEN) DAYS.  Marland Kitchen zolpidem (AMBIEN) 5 MG tablet Take 1 tablet (5 mg total) by mouth at bedtime as needed for sleep.     Allergies:   Vancomycin; Contrast media [iodinated diagnostic agents]; Avocado; Aspirin; Ciprofloxacin; and Sulfa antibiotics   Social History   Socioeconomic History  . Marital status: Married    Spouse name: Not on file  . Number of children: 1  . Years of education: Not on file  . Highest education level: Not on file  Occupational History  . Occupation: stay at home mom  Social Needs  . Financial resource strain: Not on file  . Food insecurity:    Worry: Not on file    Inability: Not on file  . Transportation needs:    Medical: Not on file    Non-medical: Not on file  Tobacco Use  . Smoking status: Never Smoker    . Smokeless tobacco: Never Used  Substance and Sexual Activity  . Alcohol use: No  . Drug use: No  . Sexual activity: Yes  Lifestyle  . Physical activity:    Days per week: Not on file    Minutes per session: Not on file  . Stress: Not on file  Relationships  . Social connections:    Talks on phone: Not on file    Gets together: Not on file    Attends religious service: Not on file    Active member of club or organization: Not on file    Attends meetings of clubs or organizations: Not on file    Relationship status: Not on file  Other Topics Concern  . Not on file  Social History Narrative  . Not on file     Family History:  The patient's family history includes Allergies in her daughter; Emphysema in her maternal grandmother; Heart disease (age of onset: 32) in her maternal grandmother; Rheum arthritis in her mother.   Vitals:   11/04/17 1504  BP: 106/68  Pulse: (!) 44  SpO2: 98%  Weight: 212 lb (96.2 kg)     Wt Readings from Last 3 Encounters:  11/04/17 212 lb (96.2 kg)  10/21/17 213 lb (96.6 kg)  10/19/17 211 lb 8 oz (95.9 kg)    Physical Exam  General: Well appearing. No resp difficulty. HEENT: Normal Neck: Supple. JVP 5-6. Carotids 2+ bilat; no bruits. No thyromegaly or nodule noted. Cor: PMI nondisplaced. RRR, No M/G/R noted Lungs: CTAB, normal effort. Abdomen: Soft, non-tender, non-distended, no HSM. No bruits or masses. +BS  Extremities: No cyanosis, clubbing, or rash. R and LLE no edema.  Neuro: Alert & orientedx3, cranial nerves grossly intact. moves all 4 extremities w/o difficulty. Affect pleasant  EKG: NSR with Bigeminy 87 bpm.   Assessment and Plan  1. Chronic systolic HF:  due to peri-partum CM, onset 1999. S/P [employer] ICD 07/2016 - EF 20-25% (12/17).  - Echo 8/18 EF 25-30% s/p ICD 3/18. [place] 3/19: RA 2, PA 28/13, Fick CO 5.7, CI 2.8 - CPX 12/18 showed moderate  limitation due mostly to obesity with some HF component - NYHA II -  Volume status okay on exam, but elevated on ICD interrogation. HeartLogic score 23, increasing since 6/4. - Restart torsemide to 40 mg two times/week  - Continue Entresto 49/51 mg BID.  - Continue Coreg 12.5 mg BID   - Continue Spiro 25 mg daily.  - Continue corlanor 7.37m BID for now.  2. [facility]  - Body mass index is 35.83 kg/m.  3. OSA - Needs new hose - she has been out of town, so has not fixed this yet. Plans to get this week.  - Likely to improve with weight loss. No change.  4. Anxiety/depression - Per PCP. No change.  5. DM2 - A1c in Jan. 2018 is 5.3 with medications.  - Per PCP. Consider Jardiance.  6. HTN - Meds as above 7. Frequent PVCs/ Bigeminy - 48 hour Holter to assess PVC burden  Discussed the above with Dr BHaroldine LawsBMET, mag today 48 hour Holter monitor Restart torsemide 40 mg 2x/week Follow up next week  AGeorgiana Shore NP  11/04/2017 3:11 PM

## 2017-11-04 ENCOUNTER — Ambulatory Visit (INDEPENDENT_AMBULATORY_CARE_PROVIDER_SITE_OTHER): Payer: Medicare Other

## 2017-11-04 ENCOUNTER — Ambulatory Visit (HOSPITAL_COMMUNITY)
Admission: RE | Admit: 2017-11-04 | Discharge: 2017-11-04 | Disposition: A | Discharge status: Home / Self Care | Payer: BLUE CROSS/BLUE SHIELD | Source: Ambulatory Visit | Attending: Internal Medicine | Admitting: Internal Medicine

## 2017-11-04 VITALS — BP 106/68 | HR 44 | Wt 212.0 lb

## 2017-11-04 DIAGNOSIS — F329 Major depressive disorder, single episode, unspecified: Secondary | ICD-10-CM | POA: Insufficient documentation

## 2017-11-04 DIAGNOSIS — I5042 Chronic combined systolic (congestive) and diastolic (congestive) heart failure: Secondary | ICD-10-CM | POA: Diagnosis not present

## 2017-11-04 DIAGNOSIS — Z886 Allergy status to analgesic agent status: Secondary | ICD-10-CM | POA: Insufficient documentation

## 2017-11-04 DIAGNOSIS — I493 Ventricular premature depolarization: Secondary | ICD-10-CM | POA: Insufficient documentation

## 2017-11-04 DIAGNOSIS — Z881 Allergy status to other antibiotic agents status: Secondary | ICD-10-CM | POA: Insufficient documentation

## 2017-11-04 DIAGNOSIS — Z79899 Other long term (current) drug therapy: Secondary | ICD-10-CM | POA: Insufficient documentation

## 2017-11-04 DIAGNOSIS — I110 Hypertensive heart disease with heart failure: Secondary | ICD-10-CM | POA: Insufficient documentation

## 2017-11-04 DIAGNOSIS — O903 Peripartum cardiomyopathy: Secondary | ICD-10-CM

## 2017-11-04 DIAGNOSIS — I5022 Chronic systolic (congestive) heart failure: Secondary | ICD-10-CM | POA: Insufficient documentation

## 2017-11-04 DIAGNOSIS — Z794 Long term (current) use of insulin: Secondary | ICD-10-CM | POA: Insufficient documentation

## 2017-11-04 DIAGNOSIS — Z9889 Other specified postprocedural states: Secondary | ICD-10-CM | POA: Diagnosis not present

## 2017-11-04 DIAGNOSIS — I498 Other specified cardiac arrhythmias: Secondary | ICD-10-CM

## 2017-11-04 DIAGNOSIS — G4733 Obstructive sleep apnea (adult) (pediatric): Secondary | ICD-10-CM | POA: Diagnosis not present

## 2017-11-04 DIAGNOSIS — I499 Cardiac arrhythmia, unspecified: Secondary | ICD-10-CM

## 2017-11-04 DIAGNOSIS — Z9851 Tubal ligation status: Secondary | ICD-10-CM | POA: Insufficient documentation

## 2017-11-04 DIAGNOSIS — Z6835 Body mass index (BMI) 35.0-35.9, adult: Secondary | ICD-10-CM | POA: Insufficient documentation

## 2017-11-04 DIAGNOSIS — E119 Type 2 diabetes mellitus without complications: Secondary | ICD-10-CM | POA: Diagnosis not present

## 2017-11-04 DIAGNOSIS — F419 Anxiety disorder, unspecified: Secondary | ICD-10-CM | POA: Insufficient documentation

## 2017-11-04 DIAGNOSIS — K589 Irritable bowel syndrome without diarrhea: Secondary | ICD-10-CM | POA: Insufficient documentation

## 2017-11-04 DIAGNOSIS — Z95810 Presence of automatic (implantable) cardiac defibrillator: Secondary | ICD-10-CM | POA: Insufficient documentation

## 2017-11-04 DIAGNOSIS — Z882 Allergy status to sulfonamides status: Secondary | ICD-10-CM | POA: Insufficient documentation

## 2017-11-04 LAB — BASIC METABOLIC PANEL
Anion gap: 7 (ref 5–15)
BUN: 6 mg/dL (ref 6–20)
CO2: 28 mmol/L (ref 22–32)
Calcium: 9.2 mg/dL (ref 8.9–10.3)
Chloride: 106 mmol/L (ref 101–111)
Creatinine, Ser: 0.82 mg/dL (ref 0.44–1.00)
GFR calc Af Amer: >60 mL/min (ref >60)
GFR calc non Af Amer: >60 mL/min (ref >60)
Glucose, Bld: 97 mg/dL (ref 65–99)
Potassium: 3.4 mmol/L — ABNORMAL LOW (ref 3.5–5.1)
Sodium: 141 mmol/L (ref 135–145)

## 2017-11-04 LAB — MAGNESIUM: Magnesium: 2.1 mg/dL (ref 1.7–2.4)

## 2017-11-04 MED ORDER — TORSEMIDE 20 MG PO TABS: 40.0000 mg | ORAL_TABLET | ORAL | 11 refills | Status: DC

## 2017-11-04 NOTE — Patient Instructions (Signed)
Routine lab work today. Will notify you of abnormal results, otherwise no news is good news!  Take Torsemide 40 mg (2 tabs) twice weekly on Thursdays and Sundays.  Will schedule you for a holter monitor (heart monitor) to be placed at [facility] office. Address: [address] ([place]), [place], [place] [address]  Phone: [phone] Their office will call you to schedule.  Follow up 1 week.  __________________________________________________________________ Marin Roberts Code: 1950  Take all medication as prescribed the day of your appointment. Bring all medications with you to your appointment.  Do the following things EVERYDAY: 1) Weigh yourself in the morning before breakfast. Write it down and keep it in a log. 2) Take your medicines as prescribed 3) Eat low salt foods-Limit salt (sodium) to 2000 mg per day.  4) Stay as active as you can everyday 5) Limit all fluids for the day to less than 2 liters

## 2017-11-05 ENCOUNTER — Encounter (HOSPITAL_COMMUNITY): Payer: Self-pay

## 2017-11-05 ENCOUNTER — Telehealth (HOSPITAL_COMMUNITY): Payer: Self-pay

## 2017-11-05 NOTE — Progress Notes (Signed)
EPIC Encounter for ICM Monitoring  Patient Name: [name] is a 42 y.o. female Date:    10/05/2017 Primary Care Physican: Hoyt Koch, MD Primary Cardiologist: Smith/Bensimhon Electrophysiologist: Faustino Congress Weight: 707EML (6/13 office visit)                                                                        Heart Failure questions reviewed, pt asymptomatic.   6/992019 Heartlogic HF Index: 23 suggesting fluid accumulation. Heartlogic score has been rising above normal threshold since 10/29/2017.      Prescribed dosage: Torsemide 20 mg Take 2 tablets (40 mg total) by mouth 2 (two) times a week. Thursday and Sunday mornings.   Potassium 20 mEq 1tablet (20 mEq total) daily.  Labs: 06/11/2017 Creatinine 1.11, BUN 23, Potassium 4.1, Sodium 136, EGFR 68.93 04/30/2017 Creatinine1.11, BUN12, Potassium3.9, Sodium137, EGFR>60  04/29/2017 Creatinine1.01, BUN11, Potassium4.3, Sodium135, EGFR>60  04/28/2017 Creatinine0.78, BUN<5, Potassium4.1, Sodium137, EGFR>60 02/03/2017 Creatinine 0.84, BUN 5, Potassium 3.3, Sodium 135, EGFR >60 01/17/2017 Creatinine 0.76, BUN >5, Potassium 3.2, Sodium 135, EGFR >60 01/07/2017 Creatinine 0.86, BUN 8, Potassium 3.4, Sodium 139, EGFR >60 10/07/2016 Creatinine 0.88, BUN 7, Potassium 4.1, Sodium 137, EGFR >60 07/30/2016 Creatinine 0.95, BUN 13, Potassium 3.8, Sodium 138, EGFR 74-85  06/24/2016 Creatinine 0.93, BUN 8, Potassium 3.5, Sodium 138, EGFR >60  06/22/2016 Creatinine 0.77, BUN 6, Potassium 3.9, Sodium 141, EGFR 96-110  05/28/2016 Creatinine 0.81, BUN 7, Potassium 3.4, Sodium 139  Recommendations:  Dr Haroldine Laws addressed fluid accumulation and PVC's at office visit 11/04/2017.  She will wear a holter monitor for 48 hours for PVCs.  Follow-up plan: ICM clinic phone appointment on 11/18/2017 to recheck fluid levels.  Office visit 11/11/2017 with HF clinic PA/NP.          Copy of ICM check sent to Dr.  Caryl Comes.  3 Month Trend:    8 Day Trend:    [name], RN 11/05/2017 10:04 AM

## 2017-11-05 NOTE — Telephone Encounter (Signed)
Result Notes for Basic metabolic panel   Notes recorded by Effie Berkshire, RN on 11/05/2017 at 3:13 PM EDT Left detailed message of instructions on patient's confidential VM, advised to return call to clinic to confirm receipt of this message. Will attempt to call again Monday. ------  Notes recorded by Georgiana Shore, NP on 11/04/2017 at 4:40 PM EDT Please clarify whether she is currently taking 20 meq potassium daily (on med list). Have her take 40 meq on days that she takes torsemide. Check BMET at follow up.

## 2017-11-08 ENCOUNTER — Telehealth (HOSPITAL_COMMUNITY): Payer: Self-pay

## 2017-11-08 ENCOUNTER — Ambulatory Visit: Payer: Medicare Other | Admitting: Family Medicine

## 2017-11-08 MED ORDER — POTASSIUM CHLORIDE CRYS ER 20 MEQ PO TBCR: 20.0000 meq | EXTENDED_RELEASE_TABLET | Freq: Every day | ORAL | 11 refills | Status: DC

## 2017-11-08 NOTE — Telephone Encounter (Signed)
Result Notes for Basic metabolic panel   Notes recorded by Effie Berkshire, RN on 11/08/2017 at 2:54 PM EDT Patient confirms taking 20 meq once daily of potassium. Patient confirmed getting VM from last week and has been taking extra potassium on fluid pill days. Reminded of upcoming appt, patient confirms she will be here. ------  Notes recorded by Effie Berkshire, RN on 11/05/2017 at 3:13 PM EDT Left detailed message of instructions on patient's confidential VM, advised to return call to clinic to confirm receipt of this message. Will attempt to call again Monday. ------  Notes recorded by Georgiana Shore, NP on 11/04/2017 at 4:40 PM EDT Please clarify whether she is currently taking 20 meq potassium daily (on med list). Have her take 40 meq on days that she takes torsemide. Check BMET at follow up.

## 2017-11-09 ENCOUNTER — Ambulatory Visit (INDEPENDENT_AMBULATORY_CARE_PROVIDER_SITE_OTHER): Payer: Medicare Other

## 2017-11-09 DIAGNOSIS — I493 Ventricular premature depolarization: Secondary | ICD-10-CM

## 2017-11-09 DIAGNOSIS — I498 Other specified cardiac arrhythmias: Secondary | ICD-10-CM

## 2017-11-09 DIAGNOSIS — I499 Cardiac arrhythmia, unspecified: Secondary | ICD-10-CM | POA: Diagnosis not present

## 2017-11-11 ENCOUNTER — Ambulatory Visit (HOSPITAL_COMMUNITY)
Admission: RE | Admit: 2017-11-11 | Discharge: 2017-11-11 | Disposition: A | Discharge status: Home / Self Care | Payer: BLUE CROSS/BLUE SHIELD | Source: Ambulatory Visit | Attending: Internal Medicine | Admitting: Internal Medicine

## 2017-11-11 ENCOUNTER — Encounter (HOSPITAL_COMMUNITY): Payer: Self-pay

## 2017-11-11 VITALS — BP 124/78 | HR 81 | Wt 214.0 lb

## 2017-11-11 DIAGNOSIS — Z881 Allergy status to other antibiotic agents status: Secondary | ICD-10-CM | POA: Diagnosis not present

## 2017-11-11 DIAGNOSIS — I499 Cardiac arrhythmia, unspecified: Secondary | ICD-10-CM | POA: Diagnosis not present

## 2017-11-11 DIAGNOSIS — R079 Chest pain, unspecified: Secondary | ICD-10-CM | POA: Insufficient documentation

## 2017-11-11 DIAGNOSIS — K589 Irritable bowel syndrome without diarrhea: Secondary | ICD-10-CM | POA: Insufficient documentation

## 2017-11-11 DIAGNOSIS — Z9851 Tubal ligation status: Secondary | ICD-10-CM | POA: Diagnosis not present

## 2017-11-11 DIAGNOSIS — F419 Anxiety disorder, unspecified: Secondary | ICD-10-CM | POA: Diagnosis not present

## 2017-11-11 DIAGNOSIS — G4733 Obstructive sleep apnea (adult) (pediatric): Secondary | ICD-10-CM | POA: Insufficient documentation

## 2017-11-11 DIAGNOSIS — Z95810 Presence of automatic (implantable) cardiac defibrillator: Secondary | ICD-10-CM | POA: Diagnosis not present

## 2017-11-11 DIAGNOSIS — Z794 Long term (current) use of insulin: Secondary | ICD-10-CM | POA: Insufficient documentation

## 2017-11-11 DIAGNOSIS — E119 Type 2 diabetes mellitus without complications: Secondary | ICD-10-CM | POA: Diagnosis not present

## 2017-11-11 DIAGNOSIS — I5042 Chronic combined systolic (congestive) and diastolic (congestive) heart failure: Secondary | ICD-10-CM

## 2017-11-11 DIAGNOSIS — Z6836 Body mass index (BMI) 36.0-36.9, adult: Secondary | ICD-10-CM | POA: Insufficient documentation

## 2017-11-11 DIAGNOSIS — I493 Ventricular premature depolarization: Secondary | ICD-10-CM | POA: Insufficient documentation

## 2017-11-11 DIAGNOSIS — I110 Hypertensive heart disease with heart failure: Secondary | ICD-10-CM | POA: Diagnosis present

## 2017-11-11 DIAGNOSIS — F329 Major depressive disorder, single episode, unspecified: Secondary | ICD-10-CM | POA: Diagnosis not present

## 2017-11-11 DIAGNOSIS — I5022 Chronic systolic (congestive) heart failure: Secondary | ICD-10-CM | POA: Insufficient documentation

## 2017-11-11 DIAGNOSIS — Z882 Allergy status to sulfonamides status: Secondary | ICD-10-CM | POA: Insufficient documentation

## 2017-11-11 DIAGNOSIS — Z886 Allergy status to analgesic agent status: Secondary | ICD-10-CM | POA: Insufficient documentation

## 2017-11-11 DIAGNOSIS — Z9889 Other specified postprocedural states: Secondary | ICD-10-CM | POA: Insufficient documentation

## 2017-11-11 DIAGNOSIS — Z79899 Other long term (current) drug therapy: Secondary | ICD-10-CM | POA: Diagnosis not present

## 2017-11-11 DIAGNOSIS — I498 Other specified cardiac arrhythmias: Secondary | ICD-10-CM

## 2017-11-11 MED ORDER — POTASSIUM CHLORIDE CRYS ER 20 MEQ PO TBCR: 20.0000 meq | EXTENDED_RELEASE_TABLET | Freq: Every day | ORAL | 11 refills | Status: DC

## 2017-11-11 MED ORDER — TORSEMIDE 20 MG PO TABS: 40.0000 mg | ORAL_TABLET | Freq: Every day | ORAL | 11 refills | Status: DC

## 2017-11-11 NOTE — Patient Instructions (Signed)
INCREASE Torsemide to 40 mg three times weekly on Monday Wednesday and Friday mornings.  INCREASE Potassium to 40 meq (2 tabs) once daily every morning.  Follow up 2-3 weeks.  ___________________________________________________________________ [name] Code: 3086  Take all medication as prescribed the day of your appointment. Bring all medications with you to your appointment.  Do the following things EVERYDAY: 1) Weigh yourself in the morning before breakfast. Write it down and keep it in a log. 2) Take your medicines as prescribed 3) Eat low salt foods-Limit salt (sodium) to 2000 mg per day.  4) Stay as active as you can everyday 5) Limit all fluids for the day to less than 2 liters

## 2017-11-11 NOTE — Progress Notes (Signed)
Advanced Heart Failure Clinic Note Date:  11/11/2017  ID:  [name], DOB Jul 07, 1975, MRN [id number]  PCP:  Hoyt Koch, MD  Cardiologist: Pernell Dupre, MD HF: Dr Haroldine Laws  [name] is a 42 y.o. 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   Was admitted 12/5-11/2016 with recurrent HF, URI, CP and elevated BP (160/110). CT chest no PE. Diuresed from 233-> 228 pounds.   Today she returns for HF follow up. Last visit torsemide was restarted 40 mg twice a week. Overall feeling fine. Denies SOB/PND/Orthopnea. No energy. Using CPAP nightly. Drinking lost of fluids.  Appetite ok. No fever or chills. Weight at down 35 pounds since gastric sleeve.  home pounds. Taking all medications.  ICD interrogated: Heart Logic 34. Impedance down. Activity < 30 minutes per day.    Cardiac Testing.  Cath 6/17 revealed normal vessels, moderate elevation in pulmonary artery pressures, and LVEF 25-30%.  [place] 3/19: RA = 2 RV = 26/7 PA = 28/13 (19) PCW = 5 Fick cardiac output/index = 5.7/2.8 PVR = 2.5 WU Ao sat = 99% PA sat = 71%, 72% High SVC sat = 65% RHC 6/17 RA 5 RV 35/6 PA 41/12 PW 11 Fick 6.4/3.0 PA sat 62%  Echo 6/15 35-40% Echo 6/17 25-30% Echo 12/17 20-25% Echo 8/18 EF 25%  CPX 2/18 FVC 2.57 (79%)    FEV1 2.14 (81%)     FEV1/FVC 83 (101%)     MVV 107 (102%) Resting HR: 106 Peak HR: 166  (93% age predicted max HR) BP rest: 136/98 BP peak: 174/88 Peak VO2: 17.1 (85% predicted peak VO2) - corrects to 28.4 for ibw VE/VCO2 slope: 33 OUES: 2.16 Peak RER: 1.09 Ventilatory Threshold: 14.6 (73% predicted or measured peak VO2) VE/MVV: 59% PETCO2 at peak: 33 O2pulse: 10  (83% predicted O2pulse)  CPX 12/18 pVO2 14.0 (corrects to 23.0 for IBW) VeVCO2  32 RER 1.0  Review of  systems complete and found to be negative unless listed in HPI.  Past Medical History:  Diagnosis Date  . AICD (automatic cardioverter/defibrillator) present   . Anxiety   . Arthritis    right shoulder   . Asthma   . CHF (congestive heart failure) ([place])   . Depression   . Diabetes mellitus without complication ([place])   . Diverticulosis   . Dyspnea    comes and goes intermittently mostly with exertion   . Fibroid    age 20  . Gallstones   . Hypertension   . IBS (irritable bowel syndrome)   . Migraine    "monthly" (08/03/2016)  . OSA on CPAP   . Ovarian cyst    1999; surgically removed  . 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 Seizures ([place])    "as a child" (08/03/2016)  . Termination of pregnancy    due to cardiac risk    Past Surgical History:  Procedure Laterality Date  . CARDIAC CATHETERIZATION N/A 11/11/2015   Procedure: Right/Left Heart Cath and Coronary Angiography;  Surgeon: Troy Sine, MD;  Location: [facility];  Service: Cardiovascular;  Laterality: N/A;  . CARDIAC CATHETERIZATION  ~ 2015  . CARDIAC DEFIBRILLATOR PLACEMENT  08/03/2016  . CESAREAN SECTION  1999  . COLONOSCOPY WITH PROPOFOL N/A 04/21/2016   Procedure: COLONOSCOPY WITH PROPOFOL;  Surgeon: Ulice Dash  Everitt Amber, MD;  Location: [facility];  Service: Gastroenterology;  Laterality: N/A;  . ESOPHAGOGASTRODUODENOSCOPY (EGD) WITH PROPOFOL N/A 04/21/2016   Procedure: ESOPHAGOGASTRODUODENOSCOPY (EGD) WITH PROPOFOL;  Surgeon: Jerene Bears, MD;  Location: [facility];  Service: Gastroenterology;  Laterality: N/A;  . FOOT FRACTURE SURGERY Right ~ 2003  . FRACTURE SURGERY    . ICD IMPLANT N/A 08/03/2016   Procedure: ICD Implant;  Surgeon: Deboraha Sprang, MD;  Location: [facility];  Service: Cardiovascular;  Laterality: N/A;  . LAPAROSCOPIC CHOLECYSTECTOMY  12/2006  . LAPAROSCOPIC GASTRIC SLEEVE RESECTION    . LAPAROSCOPY ABDOMEN DIAGNOSTIC  2008   "cut bile duct w/gallbladder  OR; had to go in later & fix leak; hospitalized for 2 months"  . LEFT HEART CATHETERIZATION WITH CORONARY ANGIOGRAM N/A 02/26/2014   Procedure: LEFT HEART CATHETERIZATION WITH CORONARY ANGIOGRAM;  Surgeon: Jettie Booze, MD;  Location: [facility];  Service: Cardiovascular;  Laterality: N/A;  . OVARIAN CYST REMOVAL Right 1999  . RIGHT HEART CATH N/A 08/05/2017   Procedure: RIGHT HEART CATH;  Surgeon: Jolaine Artist, MD;  Location: [facility];  Service: Cardiovascular;  Laterality: N/A;  . TUBAL LIGATION  1999    Current Medications:  Current Meds  Medication Sig  . albuterol (PROVENTIL HFA;VENTOLIN HFA) 108 (90 Base) MCG/ACT inhaler Inhale 1-2 puffs into the lungs every 6 (six) hours as needed for wheezing or shortness of breath. **PT NEEDS TO ESTABLISH WITH ANOTHER PROVIDER FOR ADDITIONAL REFILLS** (Patient taking differently: Inhale 1-2 puffs into the lungs every 6 (six) hours as needed for wheezing or shortness of breath. )  . blood glucose meter kit and supplies Dispense based on patient and insurance preference. Use up to two times daily as directed. (FOR ICD-10 E10.9, E11.9).  . carvedilol (COREG) 12.5 MG tablet Take 1 tablet (12.5 mg total) by mouth 2 (two) times daily with a meal.  . CORLANOR 7.5 MG TABS tablet TAKE 1 TABLET (7.5 MG TOTAL) BY MOUTH 2 (TWO) TIMES DAILY WITH A MEAL.  . cyclobenzaprine (FLEXERIL) 10 MG tablet Take 1 tablet (10 mg total) by mouth 3 (three) times daily as needed for muscle spasms.  . Diclofenac Sodium 2 % SOLN Apply 1 Pump topically 2 (two) times daily as needed (right shoulder and neck pain).   Marland Kitchen dicyclomine (BENTYL) 20 MG tablet Take 20 mg by mouth 2 (two) times daily as needed for spasms.  Marland Kitchen gabapentin (NEURONTIN) 100 MG capsule Take 200 mg by mouth at bedtime as needed (for pain).   . Insulin Pen Needle (NOVOFINE) 32G X 6 MM MISC Use to administer victoza every day  . linaclotide (LINZESS) 145 MCG CAPS capsule Take 1 capsule (145 mcg  total) by mouth daily before breakfast.  . Multiple Vitamins-Minerals (MULTIVITAMIN GUMMIES WOMENS PO) Take 2 each by mouth daily.  . nitroGLYCERIN (NITROSTAT) 0.4 MG SL tablet Place 1 tablet (0.4 mg total) under the tongue every 5 (five) minutes as needed for chest pain.  . potassium chloride SA (K-DUR,KLOR-CON) 20 MEQ tablet Take 1 tablet (20 mEq total) by mouth daily. Take extra tablet on days you take Torsemide.  . sacubitril-valsartan (ENTRESTO) 49-51 MG Take 1 tablet by mouth 2 (two) times daily.  Marland Kitchen spironolactone (ALDACTONE) 25 MG tablet Take 25 mg by mouth daily.  Marland Kitchen torsemide (DEMADEX) 20 MG tablet Take 2 tablets (40 mg total) by mouth 2 (two) times a week. Thursday and Sunday mornings.  . Vitamin D, Ergocalciferol, (DRISDOL) 50000 units  CAPS capsule TAKE 1 CAPSULE (50,000 UNITS TOTAL) BY MOUTH EVERY 7 (SEVEN) DAYS.  Marland Kitchen zolpidem (AMBIEN) 5 MG tablet Take 1 tablet (5 mg total) by mouth at bedtime as needed for sleep.     Allergies:   Vancomycin; Contrast media [iodinated diagnostic agents]; Avocado; Aspirin; Ciprofloxacin; and Sulfa antibiotics   Social History   Socioeconomic History  . Marital status: Married    Spouse name: Not on file  . Number of children: 1  . Years of education: Not on file  . Highest education level: Not on file  Occupational History  . Occupation: stay at home mom  Social Needs  . Financial resource strain: Not on file  . Food insecurity:    Worry: Not on file    Inability: Not on file  . Transportation needs:    Medical: Not on file    Non-medical: Not on file  Tobacco Use  . Smoking status: Never Smoker  . Smokeless tobacco: Never Used  Substance and Sexual Activity  . Alcohol use: No  . Drug use: No  . Sexual activity: Yes  Lifestyle  . Physical activity:    Days per week: Not on file    Minutes per session: Not on file  . Stress: Not on file  Relationships  . Social connections:    Talks on phone: Not on file    Gets together: Not on  file    Attends religious service: Not on file    Active member of club or organization: Not on file    Attends meetings of clubs or organizations: Not on file    Relationship status: Not on file  Other Topics Concern  . Not on file  Social History Narrative  . Not on file     Family History:  The patient's family history includes Allergies in her daughter; Emphysema in her maternal grandmother; Heart disease (age of onset: 34) in her maternal grandmother; Rheum arthritis in her mother.   Vitals:   11/11/17 1533  BP: 124/78  Pulse: 81  SpO2: 99%  Weight: 214 lb (97.1 kg)     Wt Readings from Last 3 Encounters:  11/11/17 214 lb (97.1 kg)  11/04/17 212 lb (96.2 kg)  10/21/17 213 lb (96.6 kg)    Physical Exam General:  Well appearing. No resp difficulty HEENT: normal Neck: supple. JVP 9-10 . Carotids 2+ bilat; no bruits. No lymphadenopathy or thryomegaly appreciated. Cor: PMI nondisplaced. Regular rate & rhythm. No rubs, gallops or murmurs. Lungs: clear Abdomen: soft, nontender, nondistended. No hepatosplenomegaly. No bruits or masses. Good bowel sounds. Extremities: no cyanosis, clubbing, rash, edema Neuro: alert & orientedx3, cranial nerves grossly intact. moves all 4 extremities w/o difficulty. Affect pleasant  EKG: In bigmeminy 81 bpm   Assessment and Plan  1. Chronic systolic HF:  due to peri-partum CM, onset 1999. S/P [employer] ICD 07/2016 - EF 20-25% (12/17).  - Echo 8/18 EF 25-30% s/p ICD 3/18. [place] 3/19: RA 2, PA 28/13, Fick CO 5.7, CI 2.8 - CPX 12/18 showed moderate limitation due mostly to obesity with some HF component - NYHA II-III  Heart Logic Score 34. Impedance down.  - Volume status mildly elevated. Suspect related increased fluid intake.  -Increase torsemide 40 mg M-W-F . Increased potassium 40 meq daily.  - Continue Entresto 49/51 mg BID. Consider increasing at her next visit.   - Continue Coreg 12.5 mg BID   - Continue Spiro 25 mg daily.  -  Continue corlanor 7.74m  BID for now.  2. Morbid obesity - s/p Gastric Sleep at [facility] 09/20/17  - Body mass index is 36.17 kg/m.  3. OSA -Continue nightly CPAP  4. Anxiety/depression - Per PCP. No change.  5. DM2 - A1c in Jan. 2018 is 5.3 with medications.  - Per PCP. Consider Jardiance.  6. HTN - Meds as above 7. Frequent PVCs/ Bigeminy - Wearing Holter Monitor now. Continue for 48 hours.  - If >10% PVC burden will need to add amiodarone.   Foloow up in 2 weeks to discuss monitor results. Suspect she will need amiodarone to suppress PVCs. Check BMET in 2 weeks.   Darrick Grinder, NP  11/11/2017 3:36 PM

## 2017-11-12 ENCOUNTER — Other Ambulatory Visit: Payer: Self-pay | Admitting: Internal Medicine

## 2017-11-12 ENCOUNTER — Other Ambulatory Visit: Payer: Self-pay | Admitting: Cardiology

## 2017-11-15 ENCOUNTER — Ambulatory Visit (INDEPENDENT_AMBULATORY_CARE_PROVIDER_SITE_OTHER): Payer: BLUE CROSS/BLUE SHIELD

## 2017-11-15 ENCOUNTER — Encounter: Payer: Self-pay | Admitting: Internal Medicine

## 2017-11-15 DIAGNOSIS — I5042 Chronic combined systolic (congestive) and diastolic (congestive) heart failure: Secondary | ICD-10-CM

## 2017-11-15 DIAGNOSIS — Z95810 Presence of automatic (implantable) cardiac defibrillator: Secondary | ICD-10-CM

## 2017-11-15 NOTE — Progress Notes (Signed)
Please double torsemide for 2 days and recehck. thanks

## 2017-11-15 NOTE — Progress Notes (Addendum)
EPIC Encounter for ICM Monitoring  Patient Name: [name] is a 42 y.o. female Date: 11/15/2017 Primary Care Physican: Hoyt Koch, MD Primary Cardiologist: Smith/Bensimhon Electrophysiologist: Faustino Congress Weight:212lbs (6/13 office visit)       Heart Failure questions reviewed, pt asymptomatic.  She said normally she will have fluid symptoms in her abdomen area but does not have any at this time.  Patient seen at HF clinic on 6/20 and Heartlogic Index was 34.  Gastric Sleeve Surgery 09/20/2017.   11/13/2017 Heartlogic HF Index score is 42 and thoracic impedance trending down suggesting fluid accumulation.  Index is > normal threshold of 16.      Prescribed dosage: Torsemide 20 mg Take 2 tablets (40 mg total) by mouth daily (last dosage change on 11/11/17).  Labs: 11/04/2017 Creatinine 0.82, BUN 6, Potassium 3.4, Sodium 141, EGFR >60 10/07/2017 Creatinine 1.05, BUN 18, Potassium 3.4, Sodium 137, EGFR >60  08/05/2017 Creatinine 1.10, BUN 17, Potassium 3.5, Sodium 135, EGFR >60  07/21/2017 Creatinine 0.9,   BUN 16, Potassium 4.1, Sodium 137  06/11/2017 Creatinine 1.11, BUN 23, Potassium 4.1, Sodium 136  A complete set of results can be found in Results Review.  Recommendations: She is drinking between 60-64 oz fluid daily and thinks she is staying within salt limitation.  Advised to review food labels for hidden salt content.    Follow-up plan: ICM clinic phone appointment on 11/23/2017 to recheck fluid levels.  Office appointment scheduled 12/02/2017 with HF clinic NP/PA.        Copy of ICM check sent to Dr. Caryl Comes and Dr. Haroldine Laws and Darrick Grinder, NP with HF clinic.            [name], RN 11/15/2017 11:02 AM

## 2017-11-16 NOTE — Progress Notes (Signed)
Call to patient and advised Dr Haroldine Laws ordered to double the Torsemide which would be 40 mg twice a day x 2 days and then return to once a day.  Recheck report on Friday, 11/19/2017.  She verbalized understanding.  She feels extremely tired.

## 2017-11-17 ENCOUNTER — Encounter: Payer: Self-pay | Admitting: Acute Care

## 2017-11-17 ENCOUNTER — Ambulatory Visit (INDEPENDENT_AMBULATORY_CARE_PROVIDER_SITE_OTHER): Payer: Medicare Other | Admitting: Acute Care

## 2017-11-17 VITALS — BP 112/60 | HR 98 | Ht 65.0 in | Wt 210.2 lb

## 2017-11-17 DIAGNOSIS — G4733 Obstructive sleep apnea (adult) (pediatric): Secondary | ICD-10-CM

## 2017-11-17 NOTE — Assessment & Plan Note (Signed)
We will schedule a Split night sleep study Continue using the CPAP machine you have at home until we can get the sleep study done. Continue on CPAP at bedtime. You appear to be benefiting from the treatment Goal is to wear for at least 6 hours each night for maximal clinical benefit. Continue to work on weight loss, as the link between excess weight  and sleep apnea is well established.  Do not drive if sleepy. Remember to clean mask, tubing, filter, and reservoir once weekly with soapy water.  Follow up with Dr. Elsworth Soho or Judson Roch NP, 30 -90 days after starting on new machine/ new settings   or before as needed.

## 2017-11-17 NOTE — Patient Instructions (Addendum)
It is nice to meet you today. We will schedule a Split night sleep study Continue using the CPAP machine you have at home until we can get the sleep study done. Continue on CPAP at bedtime. You appear to be benefiting from the treatment Goal is to wear for at least 6 hours each night for maximal clinical benefit. Continue to work on weight loss, as the link between excess weight  and sleep apnea is well established.  Do not drive if sleepy. Remember to clean mask, tubing, filter, and reservoir once weekly with soapy water.  Follow up with Dr. Elsworth Soho or Judson Roch NP, 30 -90 days after starting on new machine/ new settings   or before as needed.

## 2017-11-17 NOTE — Progress Notes (Addendum)
History of Present Illness [name] is a 42 y.o. female with OSA on CPAP, systolic HF, HTN, DM, and history of DVT not on anticoagulants.. She is followed by Dr. Elsworth [name]   11/17/2017  Pt. Presents for follow up. She states she needs a new CPAP machine. She was last seen in the office 11/2014.She has not been using her CPAP, as she states it is uncomfortable. Even when she starts out wearing her  device, she states she removes it through the night while she is asleep. . She states she has a diagnosis of CHF. She realizes the importance of compliance. She has an internal defibrillator. She has not required hospitalization as much since having the defibrillator placed. She states telemetry monitoring shows increase in  PVC's over the last 3 months, and cardiology  are unsure of the cause. She states she stopped using her CPAP 1 month ago. She states  she does feel better when she uses it, and wants to resume therapy. She feels she needs another sleep study as the last one was done 10 years ago. She denies fever, chest pain, orthopnea or hemoptysis.  Test Results: 01/2013 PFT nml PSG 11/2007 >> wt 230 pounds-AHI 9/hour, RDI 15/hour, lowest O2 sat 89%, poor sleep efficiency, TST 3h 01/2014 echo-EF 35% 12/2016 EF is 28%   CBC Latest Ref Rng & Units 08/05/2017 07/21/2017 04/28/2017  WBC 4.0 - 10.5 K/uL 6.1 7.6 6.7  Hemoglobin 12.0 - 15.0 g/dL 13.1 14.1 12.9  Hematocrit 36.0 - 46.0 % 39.3 43 39.6  Platelets 150 - 400 K/uL 220 202 247    BMP Latest Ref Rng & Units 11/04/2017 10/07/2017 08/05/2017  Glucose 65 - 99 mg/dL 97 101(H) 114(H)  BUN 6 - 20 mg/dL _0 Creatinine 0.44 - 1.00 mg/dL 0.82 1.05(H) 1.10(H)  BUN/Creat Ratio 9 - 23 - - -  Sodium 135 - 145 mmol/L 141 137 135  Potassium 3.5 - 5.1 mmol/L 3.4(L) 3.4(L) 3.5  Chloride 101 - 111 mmol/L 106 97(L) 98(L)  CO2 22 - 32 mmol/L _1 Calcium 8.9 - 10.3 mg/dL 9.2 9.8 9.2    BNP    Component Value Date/Time   BNP 478.0 (H) 04/28/2017  1742    ProBNP    Component Value Date/Time   PROBNP 933.6 (H) 02/20/2014 1510    PFT No results found for: FEV1PRE, FEV1POST, FVCPRE, FVCPOST, TLC, DLCOUNC, PREFEV1FVCRT, PSTFEV1FVCRT  No results found.   Past medical hx Past Medical History:  Diagnosis Date  . AICD (automatic cardioverter/defibrillator) present   . Anxiety   . Arthritis    right shoulder   . Asthma   . CHF (congestive heart failure) ([place])   . Depression   . Diabetes mellitus without complication ([place])   . Diverticulosis   . Dyspnea    comes and goes intermittently mostly with exertion   . Fibroid    age 89  . Gallstones   . Hypertension   . IBS (irritable bowel syndrome)   . Migraine    "monthly" (08/03/2016)  . OSA on CPAP   . Ovarian cyst    1999; surgically removed  . 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 Seizures ([place])    "as a child" (08/03/2016)  . Termination of pregnancy    due to cardiac risk     Social History   Tobacco Use  . Smoking status: Never Smoker  . Smokeless tobacco: Never Used  Substance  Use Topics  . Alcohol use: No  . Drug use: No    [name] reports that she has never smoked. She has never used smokeless tobacco. She reports that she does not drink alcohol or use drugs.  Tobacco Cessation: Never smoker Past surgical hx, Family hx, Social hx all reviewed.  Current Outpatient Medications on File Prior to Visit  Medication Sig  . albuterol (PROVENTIL HFA;VENTOLIN HFA) 108 (90 Base) MCG/ACT inhaler Inhale 1-2 puffs into the lungs every 6 (six) hours as needed for wheezing or shortness of breath. **PT NEEDS TO ESTABLISH WITH ANOTHER PROVIDER FOR ADDITIONAL REFILLS** (Patient taking differently: Inhale 1-2 puffs into the lungs every 6 (six) hours as needed for wheezing or shortness of breath. )  . blood glucose meter kit and supplies Dispense based on patient and insurance preference. Use up to two times daily as directed. (FOR ICD-10 E10.9,  E11.9).  . carvedilol (COREG) 12.5 MG tablet Take 1 tablet (12.5 mg total) by mouth 2 (two) times daily with a meal.  . CORLANOR 7.5 MG TABS tablet TAKE 1 TABLET (7.5 MG TOTAL) BY MOUTH 2 (TWO) TIMES DAILY WITH A MEAL.  . cyclobenzaprine (FLEXERIL) 10 MG tablet Take 1 tablet (10 mg total) by mouth 3 (three) times daily as needed for muscle spasms.  . Diclofenac Sodium 2 % SOLN Apply 1 Pump topically 2 (two) times daily as needed (right shoulder and neck pain).   Marland Kitchen dicyclomine (BENTYL) 20 MG tablet Take 20 mg by mouth 2 (two) times daily as needed for spasms.  Marland Kitchen gabapentin (NEURONTIN) 100 MG capsule Take 200 mg by mouth at bedtime as needed (for pain).   Marland Kitchen linaclotide (LINZESS) 145 MCG CAPS capsule Take 1 capsule (145 mcg total) by mouth daily before breakfast.  . Multiple Vitamins-Minerals (MULTIVITAMIN GUMMIES WOMENS PO) Take 2 each by mouth daily.  . nitroGLYCERIN (NITROSTAT) 0.4 MG SL tablet PLACE 1 TABLET (0.4 MG TOTAL) UNDER THE TONGUE EVERY 5 (FIVE) MINUTES AS NEEDED FOR CHEST PAIN.  Marland Kitchen potassium chloride SA (K-DUR,KLOR-CON) 20 MEQ tablet Take 1 tablet (20 mEq total) by mouth daily. Take extra tablet on days you take Torsemide.  . sacubitril-valsartan (ENTRESTO) 49-51 MG Take 1 tablet by mouth 2 (two) times daily.  Marland Kitchen spironolactone (ALDACTONE) 25 MG tablet Take 25 mg by mouth daily.  Marland Kitchen torsemide (DEMADEX) 20 MG tablet Take 2 tablets (40 mg total) by mouth daily.  . Vitamin D, Ergocalciferol, (DRISDOL) 50000 units CAPS capsule TAKE 1 CAPSULE (50,000 UNITS TOTAL) BY MOUTH EVERY 7 (SEVEN) DAYS.  Marland Kitchen zolpidem (AMBIEN) 5 MG tablet Take 1 tablet (5 mg total) by mouth at bedtime as needed for sleep.   No current facility-administered medications on file prior to visit.      Allergies  Allergen Reactions  . Vancomycin Other (See Comments)    "did something to my kidneys," PROGRESSED TO KIDNEY FAILURE!!  . Contrast Media [Iodinated Diagnostic Agents] Other (See Comments)    Multiple CT contrast  studies done over 2 weeks caused ARF  . Avocado Swelling    Causes swelling and itching  . Aspirin Other (See Comments)    Wheezing, "but patient still takes if she needs to"  . Ciprofloxacin Itching and Rash  . Sulfa Antibiotics Itching and Rash    Review Of Systems:  Constitutional:   No  weight loss, night sweats,  Fevers, chills, fatigue, or  lassitude.  HEENT:   No headaches,  Difficulty swallowing,  Tooth/dental problems, or  Sore throat,  No sneezing, itching, ear ache, nasal congestion, post nasal drip,   CV:  No chest pain,  Orthopnea, PND, swelling in lower extremities, anasarca, dizziness, palpitations, syncope.   GI  No heartburn, indigestion, abdominal pain, nausea, vomiting, diarrhea, change in bowel habits, loss of appetite, bloody stools.   Resp: No shortness of breath with exertion or at rest.  No excess mucus, no productive cough,  No non-productive cough,  No coughing up of blood.  No change in color of mucus.  No wheezing.  No chest wall deformity  Skin: no rash or lesions.  GU: no dysuria, change in color of urine, no urgency or frequency.  No flank pain, no hematuria   MS:  No joint pain or swelling.  No decreased range of motion.  No back pain.  Psych:  No change in mood or affect. No depression or anxiety.  No memory loss.   Vital Signs BP 112/60 (BP Location: Left Arm, Cuff Size: Normal)   Pulse 98   Ht _0  (1.651 m)   Wt 210 lb 3.2 oz (95.3 kg)   SpO2 99%   BMI 34.98 kg/m    Physical Exam:  General- No distress, alert and appropriate. ENT: No sinus tenderness, TM clear, pale nasal mucosa, no oral exudate,no post nasal drip, no LAN Cardiac: S1, S2, regular rate and rhythm, no murmur Chest: No wheeze/ rales/ dullness; no accessory muscle use, no nasal flaring, no sternal retractions Abd.: Soft Non-tender, ND, Body mass index is 34.98 kg/m. Ext: No clubbing cyanosis, edema Neuro:  normal strength, A&O x 3, MAE x 4,  appropriate Skin: No rashes, warm and dry Psych: normal mood and behavior   Assessment/Plan  OSA (obstructive sleep apnea) We will schedule a Split night sleep study Continue using the CPAP machine you have at home until we can get the sleep study done. Continue on CPAP at bedtime. You appear to be benefiting from the treatment Goal is to wear for at least 6 hours each night for maximal clinical benefit. Continue to work on weight loss, as the link between excess weight  and sleep apnea is well established.  Do not drive if sleepy. Remember to clean mask, tubing, filter, and reservoir once weekly with soapy water.  Follow up with Dr. Elsworth [name] or Judson Roch NP, 30 -90 days after starting on new machine/ new settings   or before as needed.       Magdalen Spatz, NP 11/17/2017  5:27 PM

## 2017-11-19 ENCOUNTER — Ambulatory Visit (INDEPENDENT_AMBULATORY_CARE_PROVIDER_SITE_OTHER): Payer: BLUE CROSS/BLUE SHIELD

## 2017-11-19 DIAGNOSIS — I5042 Chronic combined systolic (congestive) and diastolic (congestive) heart failure: Secondary | ICD-10-CM

## 2017-11-19 DIAGNOSIS — Z95810 Presence of automatic (implantable) cardiac defibrillator: Secondary | ICD-10-CM

## 2017-11-19 NOTE — Progress Notes (Signed)
EPIC Encounter for ICM Monitoring  Patient Name: [name] is a 42 y.o. female Date: 11/19/2017 Primary Care Physican: Hoyt Koch, MD Primary Cardiologist: Smith/Bensimhon Electrophysiologist: Faustino Congress Weight:209lbs        Heart Failure questions reviewed, pt asymptomatic and feels better today.  She does feel like her skin is dry.  The extra Torsemide did make her urinate more and urine is clear.     11/18/2017 [facility] HF Index: 46 after taking extra Torsemide x 2 days as Dr Haroldine Laws prescribed.  Index score has increased compared to the index score of 42 on 6/24 report.     Prescribed dosage: Torsemide 20 mg Take 2 tablets (40 mg total) by mouth daily (last dosage change on 11/11/17).  Labs: 11/04/2017 Creatinine 0.82, BUN 6,   Potassium 3.4, Sodium 141, EGFR >60 10/07/2017 Creatinine 1.05, BUN 18, Potassium 3.4, Sodium 137, EGFR >60  08/05/2017 Creatinine 1.10, BUN 17, Potassium 3.5, Sodium 135, EGFR >60  07/21/2017 Creatinine 0.9,   BUN 16, Potassium 4.1, Sodium 137  06/11/2017 Creatinine 1.11, BUN 23, Potassium 4.1, Sodium 136  A complete set of results can be found in Results Review.  Recommendations: No changes.  She is measuring fluid intake which is about 64 oz daily.  She reviews food labels for salt amount and limiting as recommended.     Follow-up plan: ICM clinic phone appointment on 11/23/2017 top recheck fluid levels.  Office appointment scheduled 12/02/2017 with HF Clinic NP/PA.        Copy of ICM check sent to Dr. Lovena Le and Dr. Haroldine Laws.  3 Month Trend         [name], RN 11/19/2017 9:17 AM

## 2017-11-21 ENCOUNTER — Other Ambulatory Visit: Payer: Self-pay

## 2017-11-21 ENCOUNTER — Encounter (HOSPITAL_COMMUNITY): Payer: Self-pay | Admitting: Emergency Medicine

## 2017-11-21 ENCOUNTER — Observation Stay (HOSPITAL_COMMUNITY)
Admission: EM | Admit: 2017-11-21 | Discharge: 2017-11-24 | Disposition: A | Discharge status: Home / Self Care | Payer: Medicare Other | Attending: Internal Medicine | Admitting: Internal Medicine

## 2017-11-21 ENCOUNTER — Emergency Department (HOSPITAL_COMMUNITY): Payer: Medicare Other

## 2017-11-21 DIAGNOSIS — Z8261 Family history of arthritis: Secondary | ICD-10-CM | POA: Diagnosis not present

## 2017-11-21 DIAGNOSIS — Z955 Presence of coronary angioplasty implant and graft: Secondary | ICD-10-CM | POA: Insufficient documentation

## 2017-11-21 DIAGNOSIS — I420 Dilated cardiomyopathy: Secondary | ICD-10-CM | POA: Diagnosis not present

## 2017-11-21 DIAGNOSIS — Z9884 Bariatric surgery status: Secondary | ICD-10-CM | POA: Insufficient documentation

## 2017-11-21 DIAGNOSIS — Z79899 Other long term (current) drug therapy: Secondary | ICD-10-CM | POA: Diagnosis not present

## 2017-11-21 DIAGNOSIS — Z886 Allergy status to analgesic agent status: Secondary | ICD-10-CM | POA: Diagnosis not present

## 2017-11-21 DIAGNOSIS — Z9049 Acquired absence of other specified parts of digestive tract: Secondary | ICD-10-CM | POA: Insufficient documentation

## 2017-11-21 DIAGNOSIS — R0789 Other chest pain: Secondary | ICD-10-CM | POA: Diagnosis not present

## 2017-11-21 DIAGNOSIS — Z91041 Radiographic dye allergy status: Secondary | ICD-10-CM | POA: Insufficient documentation

## 2017-11-21 DIAGNOSIS — J45909 Unspecified asthma, uncomplicated: Secondary | ICD-10-CM | POA: Diagnosis not present

## 2017-11-21 DIAGNOSIS — M19011 Primary osteoarthritis, right shoulder: Secondary | ICD-10-CM | POA: Diagnosis not present

## 2017-11-21 DIAGNOSIS — Z881 Allergy status to other antibiotic agents status: Secondary | ICD-10-CM | POA: Insufficient documentation

## 2017-11-21 DIAGNOSIS — I5033 Acute on chronic diastolic (congestive) heart failure: Secondary | ICD-10-CM | POA: Insufficient documentation

## 2017-11-21 DIAGNOSIS — Z882 Allergy status to sulfonamides status: Secondary | ICD-10-CM | POA: Insufficient documentation

## 2017-11-21 DIAGNOSIS — I051 Rheumatic mitral insufficiency: Secondary | ICD-10-CM | POA: Diagnosis not present

## 2017-11-21 DIAGNOSIS — Z888 Allergy status to other drugs, medicaments and biological substances status: Secondary | ICD-10-CM | POA: Insufficient documentation

## 2017-11-21 DIAGNOSIS — Z91018 Allergy to other foods: Secondary | ICD-10-CM | POA: Diagnosis not present

## 2017-11-21 DIAGNOSIS — I110 Hypertensive heart disease with heart failure: Secondary | ICD-10-CM | POA: Insufficient documentation

## 2017-11-21 DIAGNOSIS — E119 Type 2 diabetes mellitus without complications: Secondary | ICD-10-CM | POA: Diagnosis not present

## 2017-11-21 DIAGNOSIS — G4733 Obstructive sleep apnea (adult) (pediatric): Secondary | ICD-10-CM | POA: Diagnosis not present

## 2017-11-21 DIAGNOSIS — R079 Chest pain, unspecified: Secondary | ICD-10-CM | POA: Diagnosis not present

## 2017-11-21 DIAGNOSIS — K589 Irritable bowel syndrome without diarrhea: Secondary | ICD-10-CM | POA: Diagnosis not present

## 2017-11-21 DIAGNOSIS — Z9889 Other specified postprocedural states: Secondary | ICD-10-CM | POA: Insufficient documentation

## 2017-11-21 DIAGNOSIS — Z8249 Family history of ischemic heart disease and other diseases of the circulatory system: Secondary | ICD-10-CM | POA: Insufficient documentation

## 2017-11-21 DIAGNOSIS — Z6835 Body mass index (BMI) 35.0-35.9, adult: Secondary | ICD-10-CM | POA: Diagnosis not present

## 2017-11-21 DIAGNOSIS — Z95810 Presence of automatic (implantable) cardiac defibrillator: Secondary | ICD-10-CM | POA: Insufficient documentation

## 2017-11-21 DIAGNOSIS — I493 Ventricular premature depolarization: Secondary | ICD-10-CM | POA: Diagnosis not present

## 2017-11-21 LAB — CBC
HCT: 38.4 % (ref 36.0–46.0)
Hemoglobin: 12.3 g/dL (ref 12.0–15.0)
MCH: 29.6 pg (ref 26.0–34.0)
MCHC: 32 g/dL (ref 30.0–36.0)
MCV: 92.3 fL (ref 78.0–100.0)
Platelets: 210 10*3/uL (ref 150–400)
RBC: 4.16 MIL/uL (ref 3.87–5.11)
RDW: 13.5 % (ref 11.5–15.5)
WBC: 6.4 10*3/uL (ref 4.0–10.5)

## 2017-11-21 LAB — HEPATIC FUNCTION PANEL
ALT: 12 U/L (ref 0–44)
AST: 32 U/L (ref 15–41)
Albumin: 3.3 g/dL — ABNORMAL LOW (ref 3.5–5.0)
Alkaline Phosphatase: 59 U/L (ref 38–126)
Bilirubin, Direct: 0.5 mg/dL — ABNORMAL HIGH (ref 0.0–0.2)
Indirect Bilirubin: 0.6 mg/dL (ref 0.3–0.9)
Total Bilirubin: 1.1 mg/dL (ref 0.3–1.2)
Total Protein: 5.9 g/dL — ABNORMAL LOW (ref 6.5–8.1)

## 2017-11-21 LAB — BASIC METABOLIC PANEL
Anion gap: 7 (ref 5–15)
BUN: 9 mg/dL (ref 6–20)
CO2: 28 mmol/L (ref 22–32)
Calcium: 9.7 mg/dL (ref 8.9–10.3)
Chloride: 105 mmol/L (ref 98–111)
Creatinine, Ser: 0.81 mg/dL (ref 0.44–1.00)
GFR calc Af Amer: >60 mL/min (ref >60)
GFR calc non Af Amer: >60 mL/min (ref >60)
Glucose, Bld: 95 mg/dL (ref 70–99)
Potassium: 3.7 mmol/L (ref 3.5–5.1)
Sodium: 140 mmol/L (ref 135–145)

## 2017-11-21 LAB — BRAIN NATRIURETIC PEPTIDE: B Natriuretic Peptide: 181.2 pg/mL — ABNORMAL HIGH (ref 0.0–100.0)

## 2017-11-21 LAB — I-STAT TROPONIN, ED: Troponin i, poc: 0 ng/mL (ref 0.00–0.08)

## 2017-11-21 LAB — I-STAT BETA HCG BLOOD, ED (MC, WL, AP ONLY): I-stat hCG, quantitative: <5 m[IU]/mL (ref <5)

## 2017-11-21 LAB — D-DIMER, QUANTITATIVE: D-Dimer, Quant: 0.62 ug/mL-FEU — ABNORMAL HIGH (ref 0.00–0.50)

## 2017-11-21 MED ORDER — SACUBITRIL-VALSARTAN 49-51 MG PO TABS
1.0000 | ORAL_TABLET | Freq: Two times a day (BID) | ORAL | Status: DC
  Administered 2017-11-22 – 2017-11-24 (×6): 1 via ORAL
  Filled 2017-11-21 (×6): qty 1

## 2017-11-21 MED ORDER — SODIUM CHLORIDE 0.9% FLUSH
3.0000 mL | Freq: Two times a day (BID) | INTRAVENOUS | Status: DC
  Administered 2017-11-22 – 2017-11-23 (×4): 3 mL via INTRAVENOUS

## 2017-11-21 MED ORDER — TORSEMIDE 20 MG PO TABS
40.0000 mg | ORAL_TABLET | Freq: Every day | ORAL | Status: DC
  Administered 2017-11-22 – 2017-11-24 (×3): 40 mg via ORAL
  Filled 2017-11-21 (×4): qty 2

## 2017-11-21 MED ORDER — CARVEDILOL 12.5 MG PO TABS
12.5000 mg | ORAL_TABLET | Freq: Two times a day (BID) | ORAL | Status: DC
  Administered 2017-11-22 – 2017-11-24 (×4): 12.5 mg via ORAL
  Filled 2017-11-21 (×4): qty 1

## 2017-11-21 MED ORDER — NITROGLYCERIN 0.4 MG SL SUBL
SUBLINGUAL_TABLET | SUBLINGUAL | Status: AC
  Administered 2017-11-21: 0.4 mg via SUBLINGUAL
  Filled 2017-11-21: qty 1

## 2017-11-21 MED ORDER — SPIRONOLACTONE 25 MG PO TABS
25.0000 mg | ORAL_TABLET | Freq: Every day | ORAL | Status: DC
  Administered 2017-11-22 – 2017-11-24 (×3): 25 mg via ORAL
  Filled 2017-11-21 (×3): qty 1

## 2017-11-21 MED ORDER — ENOXAPARIN SODIUM 40 MG/0.4ML ~~LOC~~ SOLN
40.0000 mg | SUBCUTANEOUS | Status: DC
  Administered 2017-11-22 – 2017-11-24 (×3): 40 mg via SUBCUTANEOUS
  Filled 2017-11-21 (×3): qty 0.4

## 2017-11-21 MED ORDER — PANTOPRAZOLE SODIUM 40 MG PO TBEC
40.0000 mg | DELAYED_RELEASE_TABLET | Freq: Every day | ORAL | Status: DC
  Administered 2017-11-22 – 2017-11-24 (×3): 40 mg via ORAL
  Filled 2017-11-21 (×4): qty 1

## 2017-11-21 MED ORDER — GABAPENTIN 100 MG PO CAPS: 200.0000 mg | ORAL_CAPSULE | Freq: Every evening | ORAL | Status: DC | PRN

## 2017-11-21 MED ORDER — LINACLOTIDE 145 MCG PO CAPS
145.0000 ug | ORAL_CAPSULE | Freq: Every day | ORAL | Status: DC
  Filled 2017-11-21 (×4): qty 1

## 2017-11-21 MED ORDER — NITROGLYCERIN 0.4 MG SL SUBL
0.4000 mg | SUBLINGUAL_TABLET | SUBLINGUAL | Status: DC | PRN
  Administered 2017-11-21 (×3): 0.4 mg via SUBLINGUAL

## 2017-11-21 NOTE — ED Notes (Signed)
Patient transported to X-ray 

## 2017-11-21 NOTE — ED Notes (Signed)
Report attempted 

## 2017-11-21 NOTE — H&P (Signed)
History and Physical   [name] OVF:[id number] DOB: [birth year] DOA: 11/21/2017  PCP: Hoyt Koch, MD  Chief Complaint: chest pain  HPI:  This is a 42 year old woman with medical problems including obesity, nonischemic cardiomyopathy with reduced EF, obesity status post gastric sleeve within the past year, OSA presenting with chest pain.  Chest pain occurred evening prior to admission, described as hard center of chest, rates it 8 out of 10, nonradiating. She thinks it is worse with movement, improves with staying still. Associated symptoms include chronic fatigue. Reports she's had her dry weight. Denies any nausea or vomiting, no orthopnea, paroxysmal nocturnal dyspnea. She reports stable use 3 pillows.  She reports she's never had a blood clot before. She reports her activity is limited by her dyspnea on exertion which is stable as well as easy fatigability. She reports being independent in her ADLs and needs some assistance with some of her IADLs. She does not work, she formerly served in [employer], her daughter is 27 year old.  She reports having a left heart cath within the past year at week for switch did not reveal obstructive coronary disease.  ED Course: vital signs are normal for heart rate ranging from the 40s to 80s, respiratory rate of 27 which stent trended to normal, systolic blood pressure over 100 mmHg.  Chest x-ray did not reveal acute cardiopulmonary abnormality.  EKG did not reveal ST segment elevation.  CMP markable for albumin of 3.3. BNP of 181.  CBC normal. Cardiology consulted for conditions, hospital medicine consulted for admission.  Review of Systems: A complete ROS was obtained; pertinent positives negatives are denoted in the HPI. Otherwise, all systems are negative.   Past Medical History:  Diagnosis Date  . AICD (automatic cardioverter/defibrillator) present   . Anxiety   . Arthritis    right shoulder   . Asthma   . CHF (congestive heart  failure) ([place])   . Depression   . Diabetes mellitus without complication ([place])   . Diverticulosis   . Dyspnea    comes and goes intermittently mostly with exertion   . Fibroid    age 89  . Gallstones   . Hypertension   . IBS (irritable bowel syndrome)   . Migraine    "monthly" (08/03/2016)  . OSA on CPAP   . Ovarian cyst    1999; surgically removed  . 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 Seizures ([place])    "as a child" (08/03/2016)  . Termination of pregnancy    due to cardiac risk   Social History   Socioeconomic History  . Marital status: Married    Spouse name: Not on file  . Number of children: 1  . Years of education: Not on file  . Highest education level: Not on file  Occupational History  . Occupation: stay at home mom  Social Needs  . Financial resource strain: Not on file  . Food insecurity:    Worry: Not on file    Inability: Not on file  . Transportation needs:    Medical: Not on file    Non-medical: Not on file  Tobacco Use  . Smoking status: Never Smoker  . Smokeless tobacco: Never Used  Substance and Sexual Activity  . Alcohol use: No  . Drug use: No  . Sexual activity: Yes  Lifestyle  . Physical activity:    Days per week: Not on file    Minutes per session: Not on file  .  Stress: Not on file  Relationships  . Social connections:    Talks on phone: Not on file    Gets together: Not on file    Attends religious service: Not on file    Active member of club or organization: Not on file    Attends meetings of clubs or organizations: Not on file    Relationship status: Not on file  . Intimate partner violence:    Fear of current or ex partner: Not on file    Emotionally abused: Not on file    Physically abused: Not on file    Forced sexual activity: Not on file  Other Topics Concern  . Not on file  Social History Narrative  . Not on file   Family History  Problem Relation Age of Onset  . Emphysema Maternal  Grandmother        smoked  . Heart disease Maternal Grandmother 36       MI  . Rheum arthritis Mother   . Allergies Daughter   . Colon cancer Neg Hx     Physical Exam: Vitals:   11/21/17 1945 11/21/17 2000 11/21/17 2015 11/21/17 2030  BP: 118/76 (!) 117/56 115/66 118/63  Pulse: (!) 42 (!) 44 69 (!) 42  Resp: 15 (!) 27 (!) 27 (!) 24  Temp:      TempSrc:      SpO2: 100% 100% 100% 96%  Weight:      Height:       General: Appears calm and comfortable. Obese black woman. ENT: Grossly normal hearing, MMM. Cardiovascular: RRR. No M/R/G. No LE edema.  Respiratory: CTA bilaterally. No wheezes or crackles. Normal respiratory effort. Abdomen: Soft, non-tender. No rebound or guarding. Well healed prior surgical scar. Skin: No rash or induration seen on limited exam. Musculoskeletal: Grossly normal tone BUE/BLE. Appropriate ROM.  Psychiatric: Grossly normal mood and affect. Neurologic: Moves all extremities in coordinated fashion.  I have personally reviewed the following labs, culture data, and imaging studies.  Assessment/Plan:  #Chest pain, acute Uncertain driver.  Differential includes ischemia (although possibly less likely given normal troponin and no ST segment elevation), PE (positive D dimer, but has had a number of negative CTA studies in past), pericardial disease (appears somewhat positional in nature) vs other (GI, MSK, other).  Improved with nitroglycerin use in ED. Plan: Will trend troponin.  Nitroglycerin prn CP.  Await cardiology team's recommendations.  Will procure repeat TTE to eval as well. V/Q scan to eval for PE.  #Other problems: -Chronic systolic (reduced EF) heart failure & frequent PVCs: appears volume optimized at this time, will continue home medications (BB, Entresto, torsemide, spironolactone) with exception of IVABRADINE - will await cardiology input regarding this antiarrhythmic  -Obesity:noted, s/p gastric sleeve -OSA: CPAP qhs  DVT prophylaxis: Subq  Lovenox Code Status: Full code Disposition Plan: Anticipate D/C home in 2-5 days Consults called: cardiology consulted by emergency medicine team Admission status: admit to hospital medicine   Cheri Rous, MD [facility] QKMM:[phone]  If 7PM-7AM, please contact night-coverage [email or web address] Password [email or web address]

## 2017-11-21 NOTE — ED Triage Notes (Addendum)
Pt sates since waking up this morning she has had constant centralized chest pain radiating into the left breast with no associated symptoms. Pt is a cardiac pt at [facility] and vascular for CHF, had routine check up 20th. Bradycardic at triage. Pt has a ICD placed due to low EF.

## 2017-11-21 NOTE — ED Provider Notes (Signed)
[facility] EMERGENCY DEPARTMENT Provider Note   CSN: [id number] Arrival date & time: 11/21/17  1735     History   Chief Complaint Chief Complaint  Patient presents with  . Chest Pain    HPI [name] is a 42 y.o. female.  Patient with a history of cardiomyopathy and congestive heart failure.  She comes in today complaining of chest pressure.  The history is provided by the patient. No language interpreter was used.  Chest Pain   This is a new problem. The current episode started 12 to 24 hours ago. The problem occurs constantly. The problem has not changed since onset.The pain is associated with exertion. The pain is present in the substernal region. The pain is at a severity of 5/10. The pain is moderate. The quality of the pain is described as dull. The pain does not radiate. Pertinent negatives include no abdominal pain, no back pain, no cough and no headaches.  Pertinent negatives for past medical history include no seizures.    Past Medical History:  Diagnosis Date  . AICD (automatic cardioverter/defibrillator) present   . Anxiety   . Arthritis    right shoulder   . Asthma   . CHF (congestive heart failure) ([place])   . Depression   . Diabetes mellitus without complication ([place])   . Diverticulosis   . Dyspnea    comes and goes intermittently mostly with exertion   . Fibroid    age 63  . Gallstones   . Hypertension   . IBS (irritable bowel syndrome)   . Migraine    "monthly" (08/03/2016)  . OSA on CPAP   . Ovarian cyst    1999; surgically removed  . 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 Seizures ([place])    "as a child" (08/03/2016)  . Termination of pregnancy    due to cardiac risk    Patient Active Problem List   Diagnosis Date Noted  . Cystitis 10/21/2017  . Acute pain of right shoulder 06/16/2017  . Myofascial pain 06/16/2017  . Insomnia 05/07/2017  . History of cardiomyopathy   . Acute on chronic  systolic (congestive) heart failure ([place]) 04/29/2017  . Chest pain, due to heart failure, neg MI 04/28/2017  . 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 Nonallopathic lesion of lumbosacral region 11/16/2016  . Nonallopathic lesion of sacral region 11/16/2016  . Patellofemoral syndrome of both knees 10/16/2016  . NICM (nonischemic cardiomyopathy) ([place]) 08/03/2016  . Premature ventricular contractions 06/23/2016  . Generalized abdominal cramping   . Diabetes mellitus without complication ([place])   . Labral tear of shoulder 04/04/2015  . Nonallopathic lesion of thoracic region 08/20/2014  . Chronic combined systolic and diastolic heart failure ([place]) 03/05/2014  . Closed low lateral malleolus fracture 10/23/2013  . IBS (irritable bowel syndrome)   . Depression with anxiety 01/20/2013  . OSA (obstructive sleep apnea) 01/02/2013  . 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 Essential hypertension     Past Surgical History:  Procedure Laterality Date  . CARDIAC CATHETERIZATION N/A 11/11/2015   Procedure: Right/Left Heart Cath and Coronary Angiography;  Surgeon: Troy Sine, MD;  Location: [facility];  Service: Cardiovascular;  Laterality: N/A;  . CARDIAC CATHETERIZATION  ~ 2015  . CARDIAC DEFIBRILLATOR PLACEMENT  08/03/2016  . CESAREAN SECTION  1999  . COLONOSCOPY WITH PROPOFOL N/A 04/21/2016   Procedure: COLONOSCOPY WITH PROPOFOL;  Surgeon: Jerene Bears, MD;  Location: [facility];  Service: Gastroenterology;  Laterality: N/A;  . ESOPHAGOGASTRODUODENOSCOPY (EGD) WITH PROPOFOL N/A 04/21/2016   Procedure: ESOPHAGOGASTRODUODENOSCOPY (EGD) WITH PROPOFOL;  Surgeon: Jerene Bears, MD;  Location: [facility];  Service: Gastroenterology;  Laterality: N/A;  . FOOT FRACTURE SURGERY Right ~ 2003  . FRACTURE SURGERY    . ICD IMPLANT N/A 08/03/2016   Procedure: ICD Implant;  Surgeon: Deboraha Sprang, MD;  Location: [facility];  Service: Cardiovascular;   Laterality: N/A;  . LAPAROSCOPIC CHOLECYSTECTOMY  12/2006  . LAPAROSCOPIC GASTRIC SLEEVE RESECTION    . LAPAROSCOPY ABDOMEN DIAGNOSTIC  2008   "cut bile duct w/gallbladder OR; had to go in later & fix leak; hospitalized for 2 months"  . LEFT HEART CATHETERIZATION WITH CORONARY ANGIOGRAM N/A 02/26/2014   Procedure: LEFT HEART CATHETERIZATION WITH CORONARY ANGIOGRAM;  Surgeon: Jettie Booze, MD;  Location: [facility] C[facility] LAB;  Service: Cardiovascular;  Laterality: N/A;  . OVARIAN CYST REMOVAL Right 1999  . RIGHT HEART CATH N/A 08/05/2017   Procedure: RIGHT HEART CATH;  Surgeon: Jolaine Artist, MD;  Location: [facility];  Service: Cardiovascular;  Laterality: N/A;  . TUBAL LIGATION  1999     OB History    Gravida  2   Para  1   Term      Preterm      AB  1   Living  1     SAB      TAB  1   Ectopic      Multiple      Live Births               Home Medications    Prior to Admission medications   Medication Sig Start Date End Date Taking? Authorizing Provider  albuterol (PROVENTIL HFA;VENTOLIN HFA) 108 (90 Base) MCG/ACT inhaler Inhale 1-2 puffs into the lungs every 6 (six) hours as needed for wheezing or shortness of breath. **PT NEEDS TO ESTABLISH WITH ANOTHER PROVIDER FOR ADDITIONAL REFILLS** Patient taking differently: Inhale 1-2 puffs into the lungs every 6 (six) hours as needed for wheezing or shortness of breath.  04/26/17   Biagio Borg, MD  blood glucose meter kit and supplies Dispense based on patient and insurance preference. Use up to two times daily as directed. (FOR ICD-10 E10.9, E11.9). 06/15/17   Hoyt Koch, MD  carvedilol (COREG) 12.5 MG tablet Take 1 tablet (12.5 mg total) by mouth 2 (two) times daily with a meal. 10/07/17   Bensimhon, Shaune Pascal, MD  CORLANOR 7.5 MG TABS tablet TAKE 1 TABLET (7.5 MG TOTAL) BY MOUTH 2 (TWO) TIMES DAILY WITH A MEAL. 10/29/17   Bensimhon, Shaune Pascal, MD  cyclobenzaprine (FLEXERIL) 10 MG tablet Take 1 tablet  (10 mg total) by mouth 3 (three) times daily as needed for muscle spasms. 01/08/17   Golden Circle, FNP  Diclofenac Sodium 2 % SOLN Apply 1 Pump topically 2 (two) times daily as needed (right shoulder and neck pain).     [provider]  dicyclomine (BENTYL) 20 MG tablet Take 20 mg by mouth 2 (two) times daily as needed for spasms.    [provider]  gabapentin (NEURONTIN) 100 MG capsule Take 200 mg by mouth at bedtime as needed (for pain).     [provider]  linaclotide Rolan Lipa) 145 MCG CAPS capsule Take 1 capsule (145 mcg total) by mouth daily before breakfast. 10/19/17   Pyrtle, Lajuan Lines, MD  Multiple Vitamins-Minerals (MULTIVITAMIN GUMMIES WOMENS PO) Take 2  each by mouth daily.    [provider]  nitroGLYCERIN (NITROSTAT) 0.4 MG SL tablet PLACE 1 TABLET (0.4 MG TOTAL) UNDER THE TONGUE EVERY 5 (FIVE) MINUTES AS NEEDED FOR CHEST PAIN. 11/12/17   Isaiah Serge, NP  potassium chloride SA (K-DUR,KLOR-CON) 20 MEQ tablet Take 1 tablet (20 mEq total) by mouth daily. Take extra tablet on days you take Torsemide. 11/11/17   Clegg, Amy D, NP  sacubitril-valsartan (ENTRESTO) 49-51 MG Take 1 tablet by mouth 2 (two) times daily. 10/07/17   Bensimhon, Shaune Pascal, MD  spironolactone (ALDACTONE) 25 MG tablet Take 25 mg by mouth daily.    [provider]  torsemide (DEMADEX) 20 MG tablet Take 2 tablets (40 mg total) by mouth daily. 11/11/17   Clegg, Amy D, NP  Vitamin D, Ergocalciferol, (DRISDOL) 50000 units CAPS capsule TAKE 1 CAPSULE (50,000 UNITS TOTAL) BY MOUTH EVERY 7 (SEVEN) DAYS. 08/23/17   Lyndal Pulley, DO  zolpidem (AMBIEN) 5 MG tablet Take 1 tablet (5 mg total) by mouth at bedtime as needed for sleep. 05/10/17   Hoyt Koch, MD    Family History Family History  Problem Relation Age of Onset  . Emphysema Maternal Grandmother        smoked  . Heart disease Maternal Grandmother 30       MI  . Rheum arthritis Mother   . Allergies Daughter   .  Colon cancer Neg Hx     Social History Social History   Tobacco Use  . Smoking status: Never Smoker  . Smokeless tobacco: Never Used  Substance Use Topics  . Alcohol use: No  . Drug use: No     Allergies   Vancomycin; Contrast media [iodinated diagnostic agents]; Avocado; Aspirin; Ciprofloxacin; and Sulfa antibiotics   Review of Systems Review of Systems  Constitutional: Negative for appetite change and fatigue.  HENT: Negative for congestion, ear discharge and sinus pressure.   Eyes: Negative for discharge.  Respiratory: Negative for cough.   Cardiovascular: Positive for chest pain.  Gastrointestinal: Negative for abdominal pain and diarrhea.  Genitourinary: Negative for frequency and hematuria.  Musculoskeletal: Negative for back pain.  Skin: Negative for rash.  Neurological: Negative for seizures and headaches.  Psychiatric/Behavioral: Negative for hallucinations.     Physical Exam Updated Vital Signs BP 118/63   Pulse (!) 42   Temp 98 F (36.7 C) (Oral)   Resp (!) 24   Ht 5' 4.5" (1.638 m)   Wt 94.3 kg (208 lb)   LMP 11/03/2017   SpO2 96%   BMI 35.15 kg/m   Physical Exam  Constitutional: She is oriented to person, place, and time. She appears well-developed.  HENT:  Head: Normocephalic.  Eyes: Conjunctivae and EOM are normal. No scleral icterus.  Neck: Neck supple. No thyromegaly present.  Cardiovascular: Normal rate and regular rhythm. Exam reveals no gallop and no friction rub.  No murmur heard. Pulmonary/Chest: No stridor. She has no wheezes. She has no rales. She exhibits no tenderness.  Abdominal: She exhibits no distension. There is no tenderness. There is no rebound.  Musculoskeletal: Normal range of motion. She exhibits no edema.  Lymphadenopathy:    She has no cervical adenopathy.  Neurological: She is oriented to person, place, and time. She exhibits normal muscle tone. Coordination normal.  Skin: No rash noted. No erythema.  Psychiatric:  She has a normal mood and affect. Her behavior is normal.     ED Treatments / Results  Labs (all labs  ordered are listed, but only abnormal results are displayed) Labs Reviewed  BASIC METABOLIC PANEL  CBC  D-DIMER, QUANTITATIVE (NOT AT [facility])  [facility] FUNCTION PANEL  I-STAT TROPONIN, ED  I-STAT BETA HCG BLOOD, ED ([facility], [facility], [facility] ONLY)    EKG EKG Interpretation  Date/Time:  Sunday November 21 2017 17:44:08 EDT Ventricular Rate:  87 PR Interval:    QRS Duration: 88 QT Interval:  362 QTC Calculation: 435 R Axis:   79 Text Interpretation:  Atrial fibrillation with premature ventricular or aberrantly conducted complexes Low voltage QRS Septal infarct , age undetermined Abnormal ECG Confirmed by Milton Ferguson [phone]) on 11/21/2017 6:09:40 PM   Radiology Dg Chest 2 View  Result Date: 11/21/2017 CLINICAL DATA:  Chest pain EXAM: CHEST - 2 VIEW COMPARISON:  CT 04/29/2017, radiograph 04/28/2017 FINDINGS: Similar appearance of left-sided pacing device. No acute airspace disease or pleural effusion. Stable cardiomediastinal silhouette. No pneumothorax. Surgical changes in the upper abdomen. IMPRESSION: No active cardiopulmonary disease. Electronically Signed   By: Donavan Foil M.D.   On: 11/21/2017 18:52    Procedures Procedures (including critical care time)  Medications Ordered in ED Medications - No data to display   Initial Impression / Assessment and Plan / ED Course  I have reviewed the triage vital signs and the nursing notes.  Pertinent labs & imaging results that were available during my care of the patient were reviewed by me and considered in my medical decision making (see chart for details).     Patient with cardiomyopathy and distal heart failure.  Patient has been having chest pain all day off and on.  I spoke with cardiology and they will come consult but they wanted medicine to admit for observation  Final Clinical Impressions(s) / ED  Diagnoses   Final diagnoses:  Nonspecific chest pain    ED Discharge Orders    None       Milton Ferguson, MD 11/21/17 2103

## 2017-11-21 NOTE — ED Notes (Signed)
Charge nurse on 3 E refused pt due to active chest pain. Spoke with Dr Stana Bunting regarding chest pressure and getting access to nitro order. He made the order available to me, nitro will be given

## 2017-11-21 NOTE — ED Notes (Signed)
Pt states her chest pain has resolved however she now has a headache from the Nitro and states it feels like a balloon.

## 2017-11-22 ENCOUNTER — Observation Stay (HOSPITAL_BASED_OUTPATIENT_CLINIC_OR_DEPARTMENT_OTHER): Payer: Medicare Other

## 2017-11-22 ENCOUNTER — Encounter (HOSPITAL_COMMUNITY): Payer: Self-pay | Admitting: General Practice

## 2017-11-22 ENCOUNTER — Other Ambulatory Visit: Payer: Self-pay

## 2017-11-22 ENCOUNTER — Observation Stay (HOSPITAL_COMMUNITY): Payer: Medicare Other

## 2017-11-22 DIAGNOSIS — I493 Ventricular premature depolarization: Secondary | ICD-10-CM

## 2017-11-22 DIAGNOSIS — I208 Other forms of angina pectoris: Secondary | ICD-10-CM | POA: Diagnosis not present

## 2017-11-22 DIAGNOSIS — I499 Cardiac arrhythmia, unspecified: Secondary | ICD-10-CM

## 2017-11-22 DIAGNOSIS — E876 Hypokalemia: Secondary | ICD-10-CM

## 2017-11-22 DIAGNOSIS — Z95810 Presence of automatic (implantable) cardiac defibrillator: Secondary | ICD-10-CM

## 2017-11-22 DIAGNOSIS — R079 Chest pain, unspecified: Secondary | ICD-10-CM | POA: Diagnosis not present

## 2017-11-22 DIAGNOSIS — I5023 Acute on chronic systolic (congestive) heart failure: Secondary | ICD-10-CM | POA: Diagnosis not present

## 2017-11-22 DIAGNOSIS — R0789 Other chest pain: Secondary | ICD-10-CM | POA: Diagnosis not present

## 2017-11-22 DIAGNOSIS — R0602 Shortness of breath: Secondary | ICD-10-CM | POA: Diagnosis not present

## 2017-11-22 DIAGNOSIS — R072 Precordial pain: Secondary | ICD-10-CM

## 2017-11-22 LAB — CBC
HCT: 37 % (ref 36.0–46.0)
Hemoglobin: 11.8 g/dL — ABNORMAL LOW (ref 12.0–15.0)
MCH: 29.2 pg (ref 26.0–34.0)
MCHC: 31.9 g/dL (ref 30.0–36.0)
MCV: 91.6 fL (ref 78.0–100.0)
Platelets: 194 10*3/uL (ref 150–400)
RBC: 4.04 MIL/uL (ref 3.87–5.11)
RDW: 13.5 % (ref 11.5–15.5)
WBC: 6.5 10*3/uL (ref 4.0–10.5)

## 2017-11-22 LAB — COMPREHENSIVE METABOLIC PANEL
ALT: 12 U/L (ref 0–44)
AST: 16 U/L (ref 15–41)
Albumin: 3.3 g/dL — ABNORMAL LOW (ref 3.5–5.0)
Alkaline Phosphatase: 59 U/L (ref 38–126)
Anion gap: 6 (ref 5–15)
BUN: 8 mg/dL (ref 6–20)
CO2: 28 mmol/L (ref 22–32)
Calcium: 9.2 mg/dL (ref 8.9–10.3)
Chloride: 106 mmol/L (ref 98–111)
Creatinine, Ser: 0.84 mg/dL (ref 0.44–1.00)
GFR calc Af Amer: >60 mL/min (ref >60)
GFR calc non Af Amer: >60 mL/min (ref >60)
Glucose, Bld: 113 mg/dL — ABNORMAL HIGH (ref 70–99)
Potassium: 3.3 mmol/L — ABNORMAL LOW (ref 3.5–5.1)
Sodium: 140 mmol/L (ref 135–145)
Total Bilirubin: 0.8 mg/dL (ref 0.3–1.2)
Total Protein: 6.2 g/dL — ABNORMAL LOW (ref 6.5–8.1)

## 2017-11-22 LAB — TROPONIN I: Troponin I: <0.03 ng/mL (ref <0.03)

## 2017-11-22 LAB — SEDIMENTATION RATE: Sed Rate: 13 mm/hr (ref 0–22)

## 2017-11-22 MED ORDER — IVABRADINE HCL 7.5 MG PO TABS
7.5000 mg | ORAL_TABLET | Freq: Two times a day (BID) | ORAL | Status: DC
  Administered 2017-11-22 – 2017-11-24 (×4): 7.5 mg via ORAL
  Filled 2017-11-22 (×5): qty 1

## 2017-11-22 MED ORDER — POTASSIUM CHLORIDE CRYS ER 20 MEQ PO TBCR
20.0000 meq | EXTENDED_RELEASE_TABLET | Freq: Two times a day (BID) | ORAL | Status: DC
  Administered 2017-11-24: 20 meq via ORAL
  Filled 2017-11-22: qty 1

## 2017-11-22 MED ORDER — AMIODARONE HCL 200 MG PO TABS: 200.0000 mg | ORAL_TABLET | Freq: Two times a day (BID) | ORAL | Status: DC

## 2017-11-22 MED ORDER — TECHNETIUM TC 99M DIETHYLENETRIAME-PENTAACETIC ACID
31.4000 | Freq: Once | INTRAVENOUS | Status: AC | PRN
  Administered 2017-11-22: 31.4 via RESPIRATORY_TRACT

## 2017-11-22 MED ORDER — POTASSIUM CHLORIDE CRYS ER 20 MEQ PO TBCR
40.0000 meq | EXTENDED_RELEASE_TABLET | Freq: Three times a day (TID) | ORAL | Status: AC
  Administered 2017-11-22 – 2017-11-23 (×3): 40 meq via ORAL
  Filled 2017-11-22 (×3): qty 2

## 2017-11-22 MED ORDER — TECHNETIUM TO 99M ALBUMIN AGGREGATED
4.3000 | Freq: Once | INTRAVENOUS | Status: AC | PRN
  Administered 2017-11-22: 4.3 via INTRAVENOUS

## 2017-11-22 NOTE — Progress Notes (Signed)
[facility] Progress Note  Subjective: no new c/o  Vitals:   11/22/17 1016 11/22/17 1216 11/22/17 1304 11/22/17 1656  BP: (!) 105/56 (!) 86/45 (!) 90/35 96/66  Pulse: (!) 58 (!) 44 (!) 41 (!) 48  Resp:      Temp:  98.3 F (36.8 C)  98.1 F (36.7 C)  TempSrc:  Oral  Oral  SpO2:  95%  97%  Weight:      Height:        Inpatient medications: . amiodarone  200 mg Oral BID  . carvedilol  12.5 mg Oral BID WC  . enoxaparin (LOVENOX) injection  40 mg Subcutaneous Q24H  . ivabradine  7.5 mg Oral BID WC  . linaclotide  145 mcg Oral QAC breakfast  . pantoprazole  40 mg Oral Daily  . sacubitril-valsartan  1 tablet Oral BID  . sodium chloride flush  3 mL Intravenous Q12H  . spironolactone  25 mg Oral Daily  . torsemide  40 mg Oral Daily    gabapentin, nitroGLYCERIN  Exam: Calm, alert in no distress  no jvd Chest cta bilat RRR no mrg Abd obese soft ntnd Ext no edema of ext NF, Ox 3   Brief Summary: This is a 42 year old woman with medical problems including obesity, nonischemic cardiomyopathy with reduced EF, obesity status post gastric sleeve within the past year, OSA presented with chest pain.  Chest pain occurred evening prior to admission, described as hard center of chest, rates it 8 out of 10, nonradiating.She reported she's never had a blood clot before. She reports her activity is limited by her dyspnea on exertion which is stable as well as easy fatigability. She reported being independent in her ADLs and needs some assistance with some of her IADLs. She does not work, she formerly served in [employer], her daughter is 50 year old.  She reports having a left heart cath within the past year at week for switch did not reveal obstructive coronary disease. ED Course: vital signs showed heart rate ranging from the 40s to 80s, respiratory rate of 27 which stent trended to normal, systolic blood pressure over 100 mmHg.  Chest x-ray did not reveal acute cardiopulmonary  abnormality.  EKG did not reveal ST segment elevation.  CMP markable for albumin of 3.3. BNP of 181.  CBC normal. Cardiology consulted for conditions, hospital medicine consulted for admission.         Impression/Plan:  1) Chest pain- resolved, troponins negative. VQ negative.  Possibly related to PVC's per cardiology consult.  Starting amiodarone, see below.   2) Acute on chron syst/ diast CHF:  - echo showed LVEF 50-55%, improved from 25-30% in aug 2018 - per cards CXR limited d/t obesity w/ some HF component but no vol excess on exam - per cards consult continue entresto/ coreg/ spiro/ corlanor for now - continuing torsemide 40 mg qd  3) Frequent PVC's/ Bigeminy: holter monitor showed 30% PVC's, lots of PVC"s on telemetry per cards consult. Plan >> - starting amiodarone 200 mg bid - may need PVC ablation if no improvement  4) SP AICD 3/18  5) Hypokalemia: replace, checked Mg is 2.1 ok   DVT prophylaxis: Subq Lovenox Code Status: Full code Disposition Plan: Anticipate D/C home in 2-5 days Consults called: cardiology consult Admission status: admit to hospital medicine       Kelly Splinter MD [facility] pgr [phone] 11/22/2017, 4:59 PM   Recent Labs  Lab 11/21/17 1750 11/22/17 0039  NA 140  140  K 3.7 3.3*  CL 105 106  CO2 28 28  GLUCOSE 95 113*  BUN 9 8  CREATININE 0.81 0.84  CALCIUM 9.7 9.2   Recent Labs  Lab 11/21/17 2155 11/22/17 0039  AST 32 16  ALT 12 12  ALKPHOS 59 59  BILITOT 1.1 0.8  PROT 5.9* 6.2*  ALBUMIN 3.3* 3.3*   Recent Labs  Lab 11/21/17 1750 11/22/17 0039  WBC 6.4 6.5  HGB 12.3 11.8*  HCT 38.4 37.0  MCV 92.3 91.6  PLT 210 194   Iron/TIBC/Ferritin/ %Sat    Component Value Date/Time   FERRITIN 20.3 12/25/2013 0937

## 2017-11-22 NOTE — Consult Note (Signed)
Reason for Consult: chest pain   Requesting Physician/Service: ED/[facility]   PCP:  Hoyt Koch, MD Primary Cardiologist: Bensimhon  HPI:  [name] is a 42 y.o. 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  She presented to the emergency room tonight for chest pressure.  This is a recurrent problem for her, she states that she is previously discussed this with Dr. Haroldine Laws which led to her heart catheterization in 2017 which showed normal coronary arteries.  Its described as a central chest pressure, worse with some movement but not associated with any nausea or vomiting or orthopnea.  Right now, she feels like her fluid status is is pretty good.  Usually when she does feel more volume up it manifest itself and chest congestion and coughing.  She does not feel like she has that right now.  She tells me that she is never had a blood clot although previous cardiology records show history of DVT.  She has chronic shortness of breath not worse than normal.  On interview, she is sleeping comfortably in the hospital room.  She is arousable, she feels that her chest pressure is largely gone but she may feel some awareness in her chest still.  First set of troponin values is normal.  proBNP is on the lower end for her.  Current weight is 211 pounds which is a little bit less than it has been.    Previous Cardiac Studies: Cardiac Testing.  Cath 6/17 revealed normal vessels, moderate elevation in pulmonary artery pressures, and LVEF 25-30%.  Martin 3/19: RA = 2 RV = 26/7 PA = 28/13 (19) PCW = 5 Fick cardiac output/index = 5.7/2.8 PVR = 2.5 WU Ao sat = 99% PA sat = 71%, 72% High SVC sat = 65% RHC 6/17 RA 5 RV 35/6 PA 41/12 PW 11 Fick 6.4/3.0 PA sat 62%  Echo 6/15 35-40% Echo 6/17  25-30% Echo 12/17 20-25% Echo 8/18 EF 25%  CPX 2/18 FVC 2.57 (79%)    FEV1 2.14 (81%)     FEV1/FVC 83 (101%)     MVV 107 (102%) Resting HR: 106 Peak HR: 166  (93% age predicted max HR) BP rest: 136/98 BP peak: 174/88 Peak VO2: 17.1 (85% predicted peak VO2) - corrects to 28.4 for ibw VE/VCO2 slope: 33 OUES: 2.16 Peak RER: 1.09 Ventilatory Threshold: 14.6 (73% predicted or measured peak VO2) VE/MVV: 59% PETCO2 at peak: 33 O2pulse: 10  (83% predicted O2pulse)  CPX 12/18 pVO2 14.0 (corrects to 23.0 for IBW) VeVCO2  32 RER 1.0    Past Medical History:  Diagnosis Date  . AICD (automatic cardioverter/defibrillator) present   . Anxiety   . Arthritis    right shoulder   . Asthma   . CHF (congestive heart failure) ([place])   . Depression   . Diabetes mellitus without complication ([place])   . Diverticulosis   . Dyspnea    comes and goes intermittently mostly with exertion   . Fibroid    age 71  . Gallstones   . Hypertension   . IBS (irritable bowel syndrome)   . Migraine    "monthly" (08/03/2016)  . OSA on CPAP   . Ovarian cyst    1999; surgically removed  . 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 Seizures ([place])    "  as a child" (08/03/2016)  . Termination of pregnancy    due to cardiac risk    Past Surgical History:  Procedure Laterality Date  . CARDIAC CATHETERIZATION N/A 11/11/2015   Procedure: Right/Left Heart Cath and Coronary Angiography;  Surgeon: Troy Sine, MD;  Location: [facility];  Service: Cardiovascular;  Laterality: N/A;  . CARDIAC CATHETERIZATION  ~ 2015  . CARDIAC DEFIBRILLATOR PLACEMENT  08/03/2016  . CESAREAN SECTION  1999  . COLONOSCOPY WITH PROPOFOL N/A 04/21/2016   Procedure: COLONOSCOPY WITH PROPOFOL;  Surgeon: Jerene Bears, MD;  Location: [facility];  Service: Gastroenterology;  Laterality: N/A;  . ESOPHAGOGASTRODUODENOSCOPY (EGD) WITH PROPOFOL N/A 04/21/2016   Procedure:  ESOPHAGOGASTRODUODENOSCOPY (EGD) WITH PROPOFOL;  Surgeon: Jerene Bears, MD;  Location: [facility];  Service: Gastroenterology;  Laterality: N/A;  . FOOT FRACTURE SURGERY Right ~ 2003  . FRACTURE SURGERY    . ICD IMPLANT N/A 08/03/2016   Procedure: ICD Implant;  Surgeon: Deboraha Sprang, MD;  Location: [facility];  Service: Cardiovascular;  Laterality: N/A;  . LAPAROSCOPIC CHOLECYSTECTOMY  12/2006  . LAPAROSCOPIC GASTRIC SLEEVE RESECTION    . LAPAROSCOPY ABDOMEN DIAGNOSTIC  2008   "cut bile duct w/gallbladder OR; had to go in later & fix leak; hospitalized for 2 months"  . LEFT HEART CATHETERIZATION WITH CORONARY ANGIOGRAM N/A 02/26/2014   Procedure: LEFT HEART CATHETERIZATION WITH CORONARY ANGIOGRAM;  Surgeon: Jettie Booze, MD;  Location: [facility];  Service: Cardiovascular;  Laterality: N/A;  . OVARIAN CYST REMOVAL Right 1999  . RIGHT HEART CATH N/A 08/05/2017   Procedure: RIGHT HEART CATH;  Surgeon: Jolaine Artist, MD;  Location: [facility];  Service: Cardiovascular;  Laterality: N/A;  . TUBAL LIGATION  1999    Family History  Problem Relation Age of Onset  . Emphysema Maternal Grandmother        smoked  . Heart disease Maternal Grandmother 67       MI  . Rheum arthritis Mother   . Allergies Daughter   . Colon cancer Neg Hx    Social History:  reports that she has never smoked. She has never used smokeless tobacco. She reports that she does not drink alcohol or use drugs.  Allergies:  Allergies  Allergen Reactions  . Aspirin Anaphylaxis and Other (See Comments)    Wheezing, "but patient still takes if she needs to"  . Vancomycin Other (See Comments)    "did something to my kidneys," PROGRESSED TO KIDNEY FAILURE!!  . Contrast Media [Iodinated Diagnostic Agents] Other (See Comments)    Multiple CT contrast studies done over 2 weeks caused ARF  . Avocado Itching and Swelling  . Ciprofloxacin Itching and Rash  . Sulfa Antibiotics Itching and Rash     No current facility-administered medications on file prior to encounter.    Current Outpatient Medications on File Prior to Encounter  Medication Sig Dispense Refill  . albuterol (PROVENTIL HFA;VENTOLIN HFA) 108 (90 Base) MCG/ACT inhaler Inhale 1-2 puffs into the lungs every 6 (six) hours as needed for wheezing or shortness of breath. **PT NEEDS TO ESTABLISH WITH ANOTHER PROVIDER FOR ADDITIONAL REFILLS** (Patient taking differently: Inhale 1-2 puffs into the lungs every 6 (six) hours as needed for wheezing or shortness of breath. ) 1 Inhaler 0  . carvedilol (COREG) 12.5 MG tablet Take 1 tablet (12.5 mg total) by mouth 2 (two) times daily with a meal. 60 tablet 6  . CORLANOR 7.5 MG TABS tablet TAKE  1 TABLET (7.5 MG TOTAL) BY MOUTH 2 (TWO) TIMES DAILY WITH A MEAL. 180 tablet 3  . Diclofenac Sodium 2 % SOLN Apply 1 Pump topically 2 (two) times daily as needed (right shoulder and neck pain).     Marland Kitchen dicyclomine (BENTYL) 20 MG tablet Take 20 mg by mouth 2 (two) times daily as needed for spasms.    Marland Kitchen gabapentin (NEURONTIN) 100 MG capsule Take 200 mg by mouth at bedtime as needed (for pain).     Marland Kitchen linaclotide (LINZESS) 145 MCG CAPS capsule Take 1 capsule (145 mcg total) by mouth daily before breakfast. 30 capsule 3  . Multiple Vitamins-Minerals (MULTIVITAMIN GUMMIES WOMENS PO) Take 2 each by mouth daily.    . nitroGLYCERIN (NITROSTAT) 0.4 MG SL tablet PLACE 1 TABLET (0.4 MG TOTAL) UNDER THE TONGUE EVERY 5 (FIVE) MINUTES AS NEEDED FOR CHEST PAIN. 75 tablet 0  . omeprazole (PRILOSEC) 20 MG capsule Take 20 mg by mouth daily.    . potassium chloride SA (K-DUR,KLOR-CON) 20 MEQ tablet Take 1 tablet (20 mEq total) by mouth daily. Take extra tablet on days you take Torsemide. (Patient taking differently: Take 20-40 mEq by mouth See admin instructions. Take 20 mEq by mouth once a day and an extra 20 mEq on Mon/Wed/Fri) 45 tablet 11  . promethazine (PHENERGAN) 25 MG tablet Take 25 mg by mouth every 6 (six) hours  as needed for nausea or vomiting.   1  . RISEDRONATE SODIUM PO Take 1 capsule by mouth every 28 (twenty-eight) days.    . sacubitril-valsartan (ENTRESTO) 49-51 MG Take 1 tablet by mouth 2 (two) times daily. 60 tablet 3  . spironolactone (ALDACTONE) 25 MG tablet Take 25 mg by mouth daily.    Marland Kitchen torsemide (DEMADEX) 20 MG tablet Take 2 tablets (40 mg total) by mouth daily. 60 tablet 11  . Vitamin D, Ergocalciferol, (DRISDOL) 50000 units CAPS capsule TAKE 1 CAPSULE (50,000 UNITS TOTAL) BY MOUTH EVERY 7 (SEVEN) DAYS. (Patient taking differently: Take 50,000 Units by mouth every Wednesday. ) 12 capsule 0  . zolpidem (AMBIEN) 5 MG tablet Take 1 tablet (5 mg total) by mouth at bedtime as needed for sleep. 30 tablet 3  . blood glucose meter kit and supplies Dispense based on patient and insurance preference. Use up to two times daily as directed. (FOR ICD-10 E10.9, E11.9). 1 each 0  . cyclobenzaprine (FLEXERIL) 10 MG tablet Take 1 tablet (10 mg total) by mouth 3 (three) times daily as needed for muscle spasms. (Patient not taking: Reported on 11/21/2017) 30 tablet 0    '@medshecduled'$ @ '@medinfusions'$ @  Results for orders placed or performed during the hospital encounter of 11/21/17 (from the past 48 hour(s))  Basic metabolic panel     Status: None   Collection Time: 11/21/17  5:50 PM  Result Value Ref Range   Sodium 140 135 - 145 mmol/L   Potassium 3.7 3.5 - 5.1 mmol/L   Chloride 105 98 - 111 mmol/L    Comment: Please note change in reference range.   CO2 28 22 - 32 mmol/L   Glucose, Bld 95 70 - 99 mg/dL    Comment: Please note change in reference range.   BUN 9 6 - 20 mg/dL    Comment: Please note change in reference range.   Creatinine, Ser 0.81 0.44 - 1.00 mg/dL   Calcium 9.7 8.9 - 10.3 mg/dL   GFR calc non Af Amer >60 >60 mL/min   GFR calc Af Amer >60 >60 mL/min  Comment: (NOTE) The eGFR has been calculated using the CKD EPI equation. This calculation has not been validated in all clinical  situations. eGFR's persistently <60 mL/min signify possible Chronic Kidney Disease.    Anion gap 7 5 - 15    Comment: Performed at [facility] [address] [place], [place] [address]  CBC     Status: None   Collection Time: 11/21/17  5:50 PM  Result Value Ref Range   WBC 6.4 4.0 - 10.5 K/uL   RBC 4.16 3.87 - 5.11 MIL/uL   Hemoglobin 12.3 12.0 - 15.0 g/dL   HCT 38.4 36.0 - 46.0 %   MCV 92.3 78.0 - 100.0 fL   MCH 29.6 26.0 - 34.0 pg   MCHC 32.0 30.0 - 36.0 g/dL   RDW 13.5 11.5 - 15.5 %   Platelets 210 150 - 400 K/uL    Comment: Performed at [facility], [place] [address] [place], [place] [address]  I-stat troponin, ED     Status: None   Collection Time: 11/21/17  6:08 PM  Result Value Ref Range   Troponin i, poc 0.00 0.00 - 0.08 ng/mL   Comment 3            Comment: Due to the release kinetics of cTnI, a negative result within the first hours of the onset of symptoms does not rule out myocardial infarction with certainty. If myocardial infarction is still suspected, repeat the test at appropriate intervals.   I-Stat beta hCG blood, ED     Status: None   Collection Time: 11/21/17  6:08 PM  Result Value Ref Range   I-stat hCG, quantitative <5.0 <5 mIU/mL   Comment 3            Comment:   GEST. AGE      CONC.  (mIU/mL)   <=1 WEEK        5 - 50     2 WEEKS       50 - 500     3 WEEKS       100 - 10,000     4 WEEKS     1,000 - 30,000        FEMALE AND NON-PREGNANT FEMALE:     LESS THAN 5 mIU/mL   D-dimer, quantitative (not at [facility])     Status: Abnormal   Collection Time: 11/21/17  9:55 PM  Result Value Ref Range   D-Dimer, Quant 0.62 (H) 0.00 - 0.50 ug/mL-FEU    Comment: (NOTE) At the manufacturer cut-off of 0.50 ug/mL FEU, this assay has been documented to exclude PE with a sensitivity and negative predictive value of 97 to 99%.  At this time, this assay has not been approved by the FDA to exclude DVT/VTE. Results should be correlated with clinical  presentation. Performed at [name], [place] [address], [place], [place] [address]   Brain natriuretic peptide     Status: Abnormal   Collection Time: 11/21/17  9:55 PM  Result Value Ref Range   B Natriuretic Peptide 181.2 (H) 0.0 - 100.0 pg/mL    Comment: Performed at [facility] [address] [place], [place] [address]  Hepatic function panel     Status: Abnormal   Collection Time: 11/21/17  9:55 PM  Result Value Ref Range   Total Protein 5.9 (L) 6.5 - 8.1 g/dL    Comment: HEMOLYSIS AT THIS LEVEL MAY AFFECT RESULT   Albumin 3.3 (L)  3.5 - 5.0 g/dL   AST 32 15 - 41 U/L   ALT 12 0 - 44 U/L    Comment: Please note change in reference range.   Alkaline Phosphatase 59 38 - 126 U/L   Total Bilirubin 1.1 0.3 - 1.2 mg/dL   Bilirubin, Direct 0.5 (H) 0.0 - 0.2 mg/dL    Comment: Please note change in reference range.   Indirect Bilirubin 0.6 0.3 - 0.9 mg/dL    Comment: Performed at [facility], [place] [address] [place], [place] [address]  Comprehensive metabolic panel     Status: Abnormal   Collection Time: 11/22/17 12:39 AM  Result Value Ref Range   Sodium 140 135 - 145 mmol/L   Potassium 3.3 (L) 3.5 - 5.1 mmol/L   Chloride 106 98 - 111 mmol/L    Comment: Please note change in reference range.   CO2 28 22 - 32 mmol/L   Glucose, Bld 113 (H) 70 - 99 mg/dL    Comment: Please note change in reference range.   BUN 8 6 - 20 mg/dL    Comment: Please note change in reference range.   Creatinine, Ser 0.84 0.44 - 1.00 mg/dL   Calcium 9.2 8.9 - 10.3 mg/dL   Total Protein 6.2 (L) 6.5 - 8.1 g/dL   Albumin 3.3 (L) 3.5 - 5.0 g/dL   AST 16 15 - 41 U/L   ALT 12 0 - 44 U/L    Comment: Please note change in reference range.   Alkaline Phosphatase 59 38 - 126 U/L   Total Bilirubin 0.8 0.3 - 1.2 mg/dL   GFR calc non Af Amer >60 >60 mL/min   GFR calc Af Amer >60 >60 mL/min    Comment: (NOTE) The eGFR has been calculated using the CKD EPI equation. This calculation has not  been validated in all clinical situations. eGFR's persistently <60 mL/min signify possible Chronic Kidney Disease.    Anion gap 6 5 - 15    Comment: Performed at [facility] [address] [place], [place] [address]  CBC     Status: Abnormal   Collection Time: 11/22/17 12:39 AM  Result Value Ref Range   WBC 6.5 4.0 - 10.5 K/uL   RBC 4.04 3.87 - 5.11 MIL/uL   Hemoglobin 11.8 (L) 12.0 - 15.0 g/dL   HCT 37.0 36.0 - 46.0 %   MCV 91.6 78.0 - 100.0 fL   MCH 29.2 26.0 - 34.0 pg   MCHC 31.9 30.0 - 36.0 g/dL   RDW 13.5 11.5 - 15.5 %   Platelets 194 150 - 400 K/uL    Comment: Performed at [facility], [place] [address], [place], [place] [address]   Dg Chest 2 View  Result Date: 11/21/2017 CLINICAL DATA:  Chest pain EXAM: CHEST - 2 VIEW COMPARISON:  CT 04/29/2017, radiograph 04/28/2017 FINDINGS: Similar appearance of left-sided pacing device. No acute airspace disease or pleural effusion. Stable cardiomediastinal silhouette. No pneumothorax. Surgical changes in the upper abdomen. IMPRESSION: No active cardiopulmonary disease. Electronically Signed   By: Donavan Foil M.D.   On: 11/21/2017 18:52    ECG/TELE: sinus with PVCs  ROS: As above. Otherwise, review of systems is negative unless per above HPI  Vitals:   11/21/17 2230 11/21/17 2250 11/21/17 2300 11/21/17 2347  BP: 116/62 103/62 (!) 101/52 (!) 104/51  Pulse: (!) 30 (!) 43 87 85  Resp: (!) 22 16 20 20   Temp:    98.1 F (  36.7 C)  TempSrc:    Oral  SpO2: 99% 96% 98% 100%  Weight:    95.8 kg (211 lb 1.6 oz)  Height:    5' 4.5" (1.638 m)   Wt Readings from Last 10 Encounters:  11/21/17 95.8 kg (211 lb 1.6 oz)  11/17/17 95.3 kg (210 lb 3.2 oz)  11/11/17 97.1 kg (214 lb)  11/04/17 96.2 kg (212 lb)  10/21/17 96.6 kg (213 lb)  10/19/17 95.9 kg (211 lb 8 oz)  10/07/17 95.9 kg (211 lb 6.4 oz)  08/17/17 104.3 kg (230 lb)  08/05/17 100.7 kg (222 lb)  08/06/17 101.8 kg (224 lb 6.4 oz)    PE:  General: No acute  distress HEENT: Atraumatic, EOMI, mucous membranes moist. No JVD at 45 degrees CV: RRR, 2/6 SEM, no gallops.  Respiratory: Clear, no crackles. Normal work of breathing ABD: Non-distended and non-tender. No palpable organomegaly.  Extremities: 2+ radial pulses bilaterally. 1+ lower extremity edema. 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  Likely troponin is negative and ECG without acute ischemic changes.  She does tell me that she has had some of this sensation previously which she has discussed with her heart failure team.  This led to her previous catheterization 2017 which revealed clean coronary arteries.  There is a chance this could be related to her being mildly volume up, I discussed with her if she would be open to extra diuretic in the morning when she is done sleeping she said she would be willing to try this.  Will defer this to the morning team after their fluid exam.  D-dimer is elevated, defer to hospitalist team need for further PE work-up versus lower extremity Dopplers.  This does not sound like pericarditis or look like it on EKG.  I favor a more chronic chest pain syndrome possibly related to being mildly volume up.  Recs: - Trend enzymes - Consider extra 40 mg IV lasix in AM - Would continue home CHF medications including entresto, coreg, corlanor, spiro, torsemide PO  We will continue to follow along   Lolita Cram Yaniyah Koors  MD 11/22/2017, 1:38 AM

## 2017-11-22 NOTE — Progress Notes (Signed)
Patient currently inpatient and unable to provide Dr Bensimhon's recommendations.

## 2017-11-22 NOTE — Progress Notes (Signed)
  Echocardiogram 2D Echocardiogram has been performed.  [name] 11/22/2017, 9:44 AM

## 2017-11-22 NOTE — Progress Notes (Addendum)
Advanced Heart Failure Rounding Note  PCP-Cardiologist: No primary care provider on file.   Subjective:    Admitted to [facility] 11/21/17 with chest pressure. Described as central chest pressure worse with movement. No N/V or orthopnea. Denied edema, chest congestion, or coughing. Weight 211 on admit which is stable to below her perceived baseline.  K 3.3. No weight this am. D dimer 0.62. VQ scan 11/22/17 with no appreciable defects. Very low probability scan.   Echo 11/22/17 LVEF 50-55%, Grade 1 DD, Mild MR, Mod LAE.   Feeling good currently, just tired. Had further episode of "chest pressure" this am. She states it feels like her heart is "pounding". Denies lightheadedness or dizziness. No orthopnea or PND. Continues to lose weight s/p gastric bypass.   Troponin negative. BNP 181. Creatinine stable at 0.84.  Objective:   Weight Range: 211 lb 1.6 oz (95.8 kg) Body mass index is 35.68 kg/m.   Vital Signs:   Temp:  [97.9 F (36.6 C)-98.1 F (36.7 C)] 97.9 F (36.6 C) (07/01 0828) Pulse Rate:  [30-87] 58 (07/01 1016) Resp:  [14-27] 16 (07/01 0828) BP: (100-122)/(43-76) 105/56 (07/01 1016) SpO2:  [92 %-100 %] 97 % (07/01 0828) Weight:  [208 lb (94.3 kg)-211 lb 1.6 oz (95.8 kg)] 211 lb 1.6 oz (95.8 kg) (06/30 2347) Last BM Date: 11/20/17  Weight change: Filed Weights   11/21/17 1738 11/21/17 2347  Weight: 208 lb (94.3 kg) 211 lb 1.6 oz (95.8 kg)    Intake/Output:   Intake/Output Summary (Last 24 hours) at 11/22/2017 1122 Last data filed at 11/22/2017 0910 Gross per 24 hour  Intake 0 ml  Output 450 ml  Net -450 ml      Physical Exam    General:  Well appearing. No resp difficulty HEENT: Normal anicteric Neck: Supple. JVP 6-7 cm. Carotids 2+ bilat; no bruits. No lymphadenopathy or thyromegaly appreciated. Cor: PMI nondisplaced. Regular rate & rhythm. Frequent ectopy.  Lungs: Clear no wheeze Abdomen: obese Soft, nontender, nondistended. No hepatosplenomegaly. No bruits or  masses. Good bowel sounds. Extremities: no cyanosis, clubbing, rash, edema Neuro: alert & oriented x 3, cranial nerves grossly intact. moves all 4 extremities w/o difficulty. Affect pleasant  Telemetry   NSR with frequent PVCs ( up to 17 - 36 per minute), personally reviewed.   EKG    NSR 87 bpm with PVCs, 11/22/17. Personally reviewed.   Labs    CBC Recent Labs    11/21/17 1750 11/22/17 0039  WBC 6.4 6.5  HGB 12.3 11.8*  HCT 38.4 37.0  MCV 92.3 91.6  PLT 210 858   Basic Metabolic Panel Recent Labs    11/21/17 1750 11/22/17 0039  NA 140 140  K 3.7 3.3*  CL 105 106  CO2 28 28  GLUCOSE 95 113*  BUN 9 8  CREATININE 0.81 0.84  CALCIUM 9.7 9.2   Liver Function Tests Recent Labs    11/21/17 2155 11/22/17 0039  AST 32 16  ALT 12 12  ALKPHOS 59 59  BILITOT 1.1 0.8  PROT 5.9* 6.2*  ALBUMIN 3.3* 3.3*   No results for input(s): LIPASE, AMYLASE in the last 72 hours. Cardiac Enzymes Recent Labs    11/22/17 0039  TROPONINI <0.03    BNP: BNP (last 3 results) Recent Labs    01/17/17 1434 04/28/17 1742 11/21/17 2155  BNP 298.0* 478.0* 181.2*    ProBNP (last 3 results) No results for input(s): PROBNP in the last 8760 hours.   D-Dimer Recent Labs  11/21/17 2155  DDIMER 0.62*   Hemoglobin A1C No results for input(s): HGBA1C in the last 72 hours. Fasting Lipid Panel No results for input(s): CHOL, HDL, LDLCALC, TRIG, CHOLHDL, LDLDIRECT in the last 72 hours. Thyroid Function Tests No results for input(s): TSH, T4TOTAL, T3FREE, THYROIDAB in the last 72 hours.  Invalid input(s): FREET3  Other results:   Imaging    Dg Chest 2 View  Result Date: 11/21/2017 CLINICAL DATA:  Chest pain EXAM: CHEST - 2 VIEW COMPARISON:  CT 04/29/2017, radiograph 04/28/2017 FINDINGS: Similar appearance of left-sided pacing device. No acute airspace disease or pleural effusion. Stable cardiomediastinal silhouette. No pneumothorax. Surgical changes in the upper abdomen.  IMPRESSION: No active cardiopulmonary disease. Electronically Signed   By: Donavan Foil M.D.   On: 11/21/2017 18:52   Nm Pulmonary Perf And Vent  Result Date: 11/22/2017 CLINICAL DATA:  Chest pain and shortness of breath EXAM: NUCLEAR MEDICINE VENTILATION - PERFUSION LUNG SCAN VIEWS: Anterior, posterior, left lateral, right lateral, RPO, LPO, RAO, LAO-ventilation and perfusion RADIOPHARMACEUTICALS:  31.4 mCi of Tc-43m DTPA aerosol inhalation and 4.3 mCi Tc55m-MAA IV COMPARISON:  Chest radiograph November 21, 2017 FINDINGS: Ventilation: Radiotracer uptake bilaterally is homogeneous and symmetric bilaterally. No ventilation defects are appreciable. Perfusion: Radiotracer uptake bilaterally is homogeneous and symmetric. No perfusion defects are evident. IMPRESSION: No appreciable ventilation or perfusion defects. This study constitutes a very low probability of pulmonary embolus. Electronically Signed   By: Lowella Grip III M.D.   On: 11/22/2017 08:29      Medications:     Scheduled Medications: . carvedilol  12.5 mg Oral BID WC  . enoxaparin (LOVENOX) injection  40 mg Subcutaneous Q24H  . linaclotide  145 mcg Oral QAC breakfast  . pantoprazole  40 mg Oral Daily  . sacubitril-valsartan  1 tablet Oral BID  . sodium chloride flush  3 mL Intravenous Q12H  . spironolactone  25 mg Oral Daily  . torsemide  40 mg Oral Daily     Infusions:   PRN Medications:  gabapentin, nitroGLYCERIN    Patient Profile   [name] is a 42 y.o. female with HRN, obesity, DM2, depression, OSA on CPAP, and chronic diastolic CHF  Assessment/Plan   1. Atypical Chest pain - Resolved - Troponins negative. VQ scan negative.  - ? If could be related to ++ PVCs. Starting anti-arrhythmic therapy as below.   2. Acute on chronic systolic/diastolic CHF - Echo 08/24/33 LVEF 50-55%, Grade 1 DD, Mild MR, Mod LAE.  Improved from 25-30% in 12/2016 - CPX 12/18 showed moderate limitation due mostly to obesity with  some HF component - Volume status looks OK on exam.  - Continue Entresto 49/51 mg BID - Continue Coreg 12.5 mg BID   - Continue Spiro 25 mg daily.  - Continue corlanor 7.5mg  BID for now.   3. Morbid obesity - s/p Gastric Sleep at [facility] 09/20/17  - Body mass index is 35.68 kg/m. She continues to lose weight.   3. OSA - Encouraged nightly CPAP.   4. DM2 - Per primary.   5. Frequent PVCs/ Bigeminy - Holter Monitor 11/09/17 with 30% PVCs with one primary morphology.  - Having 17-39 PVCs a minute by tele. - With improvement in EF, could consider flecainide vs amiodarone.  - May be candidate for primary ablation. Will ask EP to see 11/23/17.   6. Hypokalemia - K 3.3 this am.  - Supp has been ordered. Follow daily.   Medication concerns reviewed with patient  and pharmacy team. Barriers identified: None at this time.  Length of Stay: 0  [name]  11/22/2017, 11:22 AM  Advanced Heart Failure Team Pager [phone] (M-F; [place])  Please contact [facility] for night-coverage after hours (4p -7a ) and weekends on [email or web address]   Patient seen and examined with the above-signed Advanced Practice Provider and/or Housestaff. I personally reviewed laboratory data, imaging studies and relevant notes. I independently examined the patient and formulated the important aspects of the plan. I have edited the note to reflect any of my changes or salient points. I have personally discussed the plan with the patient and/or family.  I have reviewed echo and EF improved. Difficult with PVCs but probably now in the 45% range. Suspect chest discomfort is likely due to frequent PVCs. Recent Holter showed nearly 30% PVCs (with one predominant foci). On tele currently she has at least that many. I think we have several options here  1) PVC suppression with amio 2) PVC suppression with flecainide or another AAA 3) Catheter ablation.  Given young age and improvement in LV function I  favor #2 or #3. Will ask EP to help localize the PVCs and weigh in which strategy they feel is best. Hopefully can go home tomorrow after EP sees unless ablation is planned.  Glori Bickers, MD  7:55 PM

## 2017-11-22 NOTE — Progress Notes (Signed)
Pt using home CPAP machine- No issues noted at time of check.

## 2017-11-22 NOTE — Progress Notes (Signed)
Received: recommendations  Message Contents  Bensimhon, Shaune Pascal, MD  Short, Laurie Panda, RN        Lets double torsemide for 3 days. Thanks

## 2017-11-23 ENCOUNTER — Other Ambulatory Visit: Payer: Self-pay | Admitting: Physician Assistant

## 2017-11-23 DIAGNOSIS — I493 Ventricular premature depolarization: Secondary | ICD-10-CM | POA: Diagnosis not present

## 2017-11-23 DIAGNOSIS — Z95810 Presence of automatic (implantable) cardiac defibrillator: Secondary | ICD-10-CM | POA: Diagnosis not present

## 2017-11-23 DIAGNOSIS — I5043 Acute on chronic combined systolic (congestive) and diastolic (congestive) heart failure: Secondary | ICD-10-CM | POA: Diagnosis not present

## 2017-11-23 DIAGNOSIS — I499 Cardiac arrhythmia, unspecified: Secondary | ICD-10-CM | POA: Diagnosis not present

## 2017-11-23 DIAGNOSIS — R079 Chest pain, unspecified: Secondary | ICD-10-CM | POA: Diagnosis not present

## 2017-11-23 DIAGNOSIS — R0789 Other chest pain: Secondary | ICD-10-CM | POA: Diagnosis not present

## 2017-11-23 DIAGNOSIS — I5022 Chronic systolic (congestive) heart failure: Secondary | ICD-10-CM | POA: Diagnosis not present

## 2017-11-23 DIAGNOSIS — E876 Hypokalemia: Secondary | ICD-10-CM | POA: Diagnosis not present

## 2017-11-23 DIAGNOSIS — R072 Precordial pain: Secondary | ICD-10-CM | POA: Diagnosis not present

## 2017-11-23 LAB — BASIC METABOLIC PANEL
Anion gap: 9 (ref 5–15)
BUN: 11 mg/dL (ref 6–20)
CO2: 28 mmol/L (ref 22–32)
Calcium: 8.9 mg/dL (ref 8.9–10.3)
Chloride: 101 mmol/L (ref 98–111)
Creatinine, Ser: 1.08 mg/dL — ABNORMAL HIGH (ref 0.44–1.00)
GFR calc Af Amer: >60 mL/min (ref >60)
GFR calc non Af Amer: >60 mL/min (ref >60)
Glucose, Bld: 95 mg/dL (ref 70–99)
Potassium: 3.5 mmol/L (ref 3.5–5.1)
Sodium: 138 mmol/L (ref 135–145)

## 2017-11-23 NOTE — Consult Note (Addendum)
Cardiology Consultation:   Patient ID: [name]; [id number]; 04-Feb-1976   Admit date: 11/21/2017 Date of Consult: 11/23/2017  Primary Care Provider: Hoyt Koch, MD Primary Cardiologist: Dr. Tamala Julian AHF: Dr. Haroldine Laws Primary Electrophysiologist:  Dr. Caryl Comes   Patient Profile:   [name] is a 42 y.o. 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 Haroldine Laws.  History of Present Illness:   [name] saw the AHF team last month, she was placed on a 48hr monitor to further evaluate her PVC's with plans to follow up on this, and mention of using amiodarone if PVC burden was >10%. She was admitted yesterday with c/o CP, VW was low probability, Trop x1 was negative and ultimately described as a pounding. Not felt to be acutely volume overloaded and suspect her symptom 2/2 the PVC's.  Her out patient 48hr monitor done last month noted PVC burden at 30%.  Her TTE this admission noted an improvement in her EF to 50-55%, Dr. Hayden Pedro mention in his note, given PVCs is difficult, LVEF likely 45% range.  EP is asked to weigh in on her PVCs and management options.   LABS K+ 3.7 >> 3.5 BUN/Creat 11/1.08 Trop I <0.03 BNP 181 WBC 6.5 H/H 11/37 Plts 194  She feels well today in general, c/w a slight constant component of heaviness in ger chest, aware of occassional strong heart beats mostly, and in general most of the time chronically somewhat fatigued, though more so lately.  She has not had any syncope, she will get slightly orthostatic after taking all her meds, though never near syncopal.  No ICD shock history  Device information: BSCi single chamber ICD, implanted 08/03/16, Dr. Caryl Comes  Past Medical History:  Diagnosis Date  . AICD (automatic cardioverter/defibrillator) present   . Anxiety   .  Arthritis    right shoulder   . Asthma   . CHF (congestive heart failure) ([place])   . Depression   . Diabetes mellitus without complication ([place])   . Diverticulosis   . Dyspnea    comes and goes intermittently mostly with exertion   . Fibroid    age 1  . Gallstones   . Hypertension   . IBS (irritable bowel syndrome)   . Migraine    "monthly" (08/03/2016)  . OSA on CPAP   . Ovarian cyst    1999; surgically removed  . 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 Seizures ([place])    "as a child" (08/03/2016)  . Termination of pregnancy    due to cardiac risk    Past Surgical History:  Procedure Laterality Date  . CARDIAC CATHETERIZATION N/A 11/11/2015   Procedure: Right/Left Heart Cath and Coronary Angiography;  Surgeon: Troy Sine, MD;  Location: [place] [facility];  Service: Cardiovascular;  Laterality: N/A;  . CARDIAC CATHETERIZATION  ~ 2015  . CARDIAC DEFIBRILLATOR PLACEMENT  08/03/2016  . CESAREAN SECTION  1999  . COLONOSCOPY WITH PROPOFOL N/A 04/21/2016   Procedure: COLONOSCOPY WITH PROPOFOL;  Surgeon: Jerene Bears, MD;  Location: [facility];  Service: Gastroenterology;  Laterality: N/A;  . ESOPHAGOGASTRODUODENOSCOPY (EGD) WITH PROPOFOL N/A 04/21/2016   Procedure: ESOPHAGOGASTRODUODENOSCOPY (EGD) WITH PROPOFOL;  Surgeon: Jerene Bears, MD;  Location: [facility];  Service: Gastroenterology;  Laterality: N/A;  . FOOT FRACTURE SURGERY Right ~  2003  . FRACTURE SURGERY    . ICD IMPLANT N/A 08/03/2016   Procedure: ICD Implant;  Surgeon: Deboraha Sprang, MD;  Location: [place] [facility];  Service: Cardiovascular;  Laterality: N/A;  . LAPAROSCOPIC CHOLECYSTECTOMY  12/2006  . LAPAROSCOPIC GASTRIC SLEEVE RESECTION    . LAPAROSCOPY ABDOMEN DIAGNOSTIC  2008   "cut bile duct w/gallbladder OR; had to go in later & fix leak; hospitalized for 2 months"  . LEFT HEART CATHETERIZATION WITH CORONARY ANGIOGRAM N/A 02/26/2014   Procedure: LEFT HEART CATHETERIZATION WITH  CORONARY ANGIOGRAM;  Surgeon: Jettie Booze, MD;  Location: [facility];  Service: Cardiovascular;  Laterality: N/A;  . OVARIAN CYST REMOVAL Right 1999  . RIGHT HEART CATH N/A 08/05/2017   Procedure: RIGHT HEART CATH;  Surgeon: Jolaine Artist, MD;  Location: [place] [facility];  Service: Cardiovascular;  Laterality: N/A;  . TUBAL LIGATION  1999      Inpatient Medications: Scheduled Meds: . carvedilol  12.5 mg Oral BID WC  . enoxaparin (LOVENOX) injection  40 mg Subcutaneous Q24H  . ivabradine  7.5 mg Oral BID WC  . linaclotide  145 mcg Oral QAC breakfast  . pantoprazole  40 mg Oral Daily  . [START ON 11/24/2017] potassium chloride  20 mEq Oral BID  . sacubitril-valsartan  1 tablet Oral BID  . sodium chloride flush  3 mL Intravenous Q12H  . spironolactone  25 mg Oral Daily  . torsemide  40 mg Oral Daily   Continuous Infusions:  PRN Meds: gabapentin, nitroGLYCERIN  Allergies:    Allergies  Allergen Reactions  . Aspirin Anaphylaxis and Other (See Comments)    Wheezing, "but patient still takes if she needs to"  . Vancomycin Other (See Comments)    "did something to my kidneys," PROGRESSED TO KIDNEY FAILURE!!  . Contrast Media [Iodinated Diagnostic Agents] Other (See Comments)    Multiple CT contrast studies done over 2 weeks caused ARF  . Avocado Itching and Swelling  . Ciprofloxacin Itching and Rash  . Sulfa Antibiotics Itching and Rash    Social History:   Social History   Socioeconomic History  . Marital status: Married    Spouse name: Not on file  . Number of children: 1  . Years of education: Not on file  . Highest education level: Not on file  Occupational History  . Occupation: stay at home mom  Social Needs  . Financial resource strain: Not on file  . Food insecurity:    Worry: Not on file    Inability: Not on file  . Transportation needs:    Medical: Not on file    Non-medical: Not on file  Tobacco Use  . Smoking status: Never Smoker  .  Smokeless tobacco: Never Used  Substance and Sexual Activity  . Alcohol use: No  . Drug use: No  . Sexual activity: Yes  Lifestyle  . Physical activity:    Days per week: Not on file    Minutes per session: Not on file  . Stress: Not on file  Relationships  . Social connections:    Talks on phone: Not on file    Gets together: Not on file    Attends religious service: Not on file    Active member of club or organization: Not on file    Attends meetings of clubs or organizations: Not on file    Relationship status: Not on file  . Intimate partner violence:    Fear of current  or ex partner: Not on file    Emotionally abused: Not on file    Physically abused: Not on file    Forced sexual activity: Not on file  Other Topics Concern  . Not on file  Social History Narrative  . Not on file    Family History:   Family History  Problem Relation Age of Onset  . Emphysema Maternal Grandmother        smoked  . Heart disease Maternal Grandmother 22       MI  . Rheum arthritis Mother   . Allergies Daughter   . Colon cancer Neg Hx      ROS:  Please see the history of present illness.  All other ROS reviewed and negative.     Physical Exam/Data:   Vitals:   11/22/17 1831 11/22/17 1935 11/23/17 0413 11/23/17 0752  BP: 98/62 (!) 96/51 (!) 96/43 (!) 83/42  Pulse: 85 90 67 64  Resp:  18 18 16   Temp:  98.4 F (36.9 C) 98.1 F (36.7 C) 97.9 F (36.6 C)  TempSrc:  Oral Oral Oral  SpO2:  93% 98% 98%  Weight:   206 lb 9.6 oz (93.7 kg)   Height:        Intake/Output Summary (Last 24 hours) at 11/23/2017 1002 Last data filed at 11/23/2017 0600 Gross per 24 hour  Intake 518 ml  Output 1850 ml  Net -1332 ml   Filed Weights   11/21/17 1738 11/21/17 2347 11/23/17 0413  Weight: 208 lb (94.3 kg) 211 lb 1.6 oz (95.8 kg) 206 lb 9.6 oz (93.7 kg)   Body mass index is 34.92 kg/m.  General:  Well nourished, well developed, in no acute distress HEENT: normal Lymph: no  adenopathy Neck: no JVD Endocrine:  No thryomegaly Vascular: No carotid bruits  Cardiac:  RRR; no murmurs, gallops or rubs Lungs:  CTA b/l, no wheezing, rhonchi or rales  Abd: soft, nontender  Ext: no edema Musculoskeletal:  No deformities Skin: warm and dry  Neuro:   No gross focal abnormalities noted Psych:  Normal affect   EKG:  The EKG was personally reviewed and demonstrates:   SR, 87bpm, PVCs Telemetry:  Telemetry was personally reviewed and demonstrates:   SR 60's-80's, PVCs are anywhere from periods of bigeminy/trigeminy to occassional  Relevant CV Studies:  11/22/17: TTE Study Conclusions - Left ventricle: The cavity size was normal. Wall thickness was   increased in a pattern of mild LVH. Systolic function was normal.   The estimated ejection fraction was in the range of 50% to 55%.   Wall motion was normal; there were no regional wall motion   abnormalities. Doppler parameters are consistent with abnormal   left ventricular relaxation (grade 1 diastolic dysfunction). - Mitral valve: There was mild regurgitation. - Left atrium: The atrium was moderately dilated.  11/09/17: 48hr monitor 1. NSR with frequent PVCs 30% (with one predominant morphology) 2. Brief run NSVT Will need PVC suppression versus ablation.   CPX testing Feb/2018, submax test done in setting of missing her am meds. Shows moderate limitation due mostly to obesity with some HF component  08/05/17: RHC Findings: RA = 2 RV = 26/7 PA = 28/13 (19) PCW = 5 Fick cardiac output/index = 5.7/2.8 PVR = 2.5 WU Ao sat = 99% PA sat = 71%, 72% High SVC sat = 65% Assessment: 1. Normal hemodynamics 2. No evidence of intracardiac shunt  01/07/17: TTE Study Conclusions - Left ventricle: The cavity size was  severely dilated. Wall   thickness was normal. The estimated ejection fraction was 25%.   Diffuse hypokinesis. Doppler parameters are consistent with both   elevated ventricular end-diastolic filling  pressure and elevated   left atrial filling pressure. - Mitral valve: There was mild regurgitation. - Left atrium: The atrium was mildly dilated.  Cath 6/17 revealed normal vessels, moderate elevation in pulmonary artery pressures, and LVEF 25-30%.    Laboratory Data:  Chemistry Recent Labs  Lab 11/21/17 1750 11/22/17 0039 11/23/17 0424  NA 140 140 138  K 3.7 3.3* 3.5  CL 105 106 101  CO2 28 28 28   GLUCOSE 95 113* 95  BUN 9 8 11   CREATININE 0.81 0.84 1.08*  CALCIUM 9.7 9.2 8.9  GFRNONAA >60 >60 >60  GFRAA >60 >60 >60  ANIONGAP 7 6 9     Recent Labs  Lab 11/21/17 2155 11/22/17 0039  PROT 5.9* 6.2*  ALBUMIN 3.3* 3.3*  AST 32 16  ALT 12 12  ALKPHOS 59 59  BILITOT 1.1 0.8   Hematology Recent Labs  Lab 11/21/17 1750 11/22/17 0039  WBC 6.4 6.5  RBC 4.16 4.04  HGB 12.3 11.8*  HCT 38.4 37.0  MCV 92.3 91.6  MCH 29.6 29.2  MCHC 32.0 31.9  RDW 13.5 13.5  PLT 210 194   Cardiac Enzymes Recent Labs  Lab 11/22/17 0039  TROPONINI <0.03    Recent Labs  Lab 11/21/17 1808  TROPIPOC 0.00    BNP Recent Labs  Lab 11/21/17 2155  BNP 181.2*    DDimer  Recent Labs  Lab 11/21/17 2155  DDIMER 0.62*    Radiology/Studies:   Dg Chest 2 View Result Date: 11/21/2017 CLINICAL DATA:  Chest pain EXAM: CHEST - 2 VIEW COMPARISON:  CT 04/29/2017, radiograph 04/28/2017 FINDINGS: Similar appearance of left-sided pacing device. No acute airspace disease or pleural effusion. Stable cardiomediastinal silhouette. No pneumothorax. Surgical changes in the upper abdomen. IMPRESSION: No active cardiopulmonary disease. Electronically Signed   By: Donavan Foil M.D.   On: 11/21/2017 18:52    Nm Pulmonary Perf And Vent Result Date: 11/22/2017 CLINICAL DATA:  Chest pain and shortness of breath EXAM: NUCLEAR MEDICINE VENTILATION - PERFUSION LUNG SCAN VIEWS: Anterior, posterior, left lateral, right lateral, RPO, LPO, RAO, LAO-ventilation and perfusion RADIOPHARMACEUTICALS:  31.4 mCi of  Tc-6m DTPA aerosol inhalation and 4.3 mCi Tc87m-MAA IV COMPARISON:  Chest radiograph November 21, 2017 FINDINGS: Ventilation: Radiotracer uptake bilaterally is homogeneous and symmetric bilaterally. No ventilation defects are appreciable. Perfusion: Radiotracer uptake bilaterally is homogeneous and symmetric. No perfusion defects are evident. IMPRESSION: No appreciable ventilation or perfusion defects. This study constitutes a very low probability of pulmonary embolus. Electronically Signed   By: Lowella Grip III M.D.   On: 11/22/2017 08:29    Assessment and Plan:   1. PVC's     Holter report noted appeared a single foci     Will review EKG with Dr. Lovena Le  AAD options: Would avoid amiodarone given young age Her LV is not normal, I don't think Flecainide is an option BP will limit titration of her BB further    Ablation may be a good option for her, Dr. Lovena Le will review and see.   For questions or updates, please contact [place] Please consult [email or web address] for contact info under Cardiology/STEMI.   Signed, Baldwin [place], PA-C  11/23/2017 10:02 AM  EP Attending  Patient seen and examined. Agree with above. The patient is doing well and her PVC's have quieted down.  I have reviewed the tele and holter findings and would suggest EP study and catheter ablation of her PVC"s as an outptatient. Unclear if these are originating from the RV or LV outflow tract or elsewhere. Medical options for PVC prevention are limited by her other comorbidities and young age. We will review our schedule, allow her to be discharged home and scheduled for ablation next week.  Mikle Bosworth.D.

## 2017-11-23 NOTE — H&P (View-Only) (Signed)
Cardiology Consultation:   Patient ID: PARYS ELENBAAS; [id number]; 1975/06/08   Admit date: 11/21/2017 Date of Consult: 11/23/2017  Primary Care Provider: Hoyt Koch, MD Primary Cardiologist: Dr. Tamala Julian AHF: Dr. Haroldine Laws Primary Electrophysiologist:  Dr. Caryl Comes   Patient Profile:   [name] is a 42 y.o. 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 Haroldine Laws.  History of Present Illness:   [name] saw the AHF team last month, she was placed on a 48hr monitor to further evaluate her PVC's with plans to follow up on this, and mention of using amiodarone if PVC burden was >10%. She was admitted yesterday with c/o CP, VW was low probability, Trop x1 was negative and ultimately described as a pounding. Not felt to be acutely volume overloaded and suspect her symptom 2/2 the PVC's.  Her out patient 48hr monitor done last month noted PVC burden at 30%.  Her TTE this admission noted an improvement in her EF to 50-55%, Dr. Hayden Pedro mention in his note, given PVCs is difficult, LVEF likely 45% range.  EP is asked to weigh in on her PVCs and management options.   LABS K+ 3.7 >> 3.5 BUN/Creat 11/1.08 Trop I <0.03 BNP 181 WBC 6.5 H/H 11/37 Plts 194  She feels well today in general, c/w a slight constant component of heaviness in ger chest, aware of occassional strong heart beats mostly, and in general most of the time chronically somewhat fatigued, though more so lately.  She has not had any syncope, she will get slightly orthostatic after taking all her meds, though never near syncopal.  No ICD shock history  Device information: BSCi single chamber ICD, implanted 08/03/16, Dr. Caryl Comes  Past Medical History:  Diagnosis Date  . AICD (automatic cardioverter/defibrillator) present   . Anxiety   .  Arthritis    right shoulder   . Asthma   . CHF (congestive heart failure) ([place])   . Depression   . Diabetes mellitus without complication ([place])   . Diverticulosis   . Dyspnea    comes and goes intermittently mostly with exertion   . Fibroid    age 41  . Gallstones   . Hypertension   . IBS (irritable bowel syndrome)   . Migraine    "monthly" (08/03/2016)  . OSA on CPAP   . Ovarian cyst    1999; surgically removed  . 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 Seizures ([place])    "as a child" (08/03/2016)  . Termination of pregnancy    due to cardiac risk    Past Surgical History:  Procedure Laterality Date  . CARDIAC CATHETERIZATION N/A 11/11/2015   Procedure: Right/Left Heart Cath and Coronary Angiography;  Surgeon: Troy Sine, MD;  Location: [facility];  Service: Cardiovascular;  Laterality: N/A;  . CARDIAC CATHETERIZATION  ~ 2015  . CARDIAC DEFIBRILLATOR PLACEMENT  08/03/2016  . CESAREAN SECTION  1999  . COLONOSCOPY WITH PROPOFOL N/A 04/21/2016   Procedure: COLONOSCOPY WITH PROPOFOL;  Surgeon: Jerene Bears, MD;  Location: [facility];  Service: Gastroenterology;  Laterality: N/A;  . ESOPHAGOGASTRODUODENOSCOPY (EGD) WITH PROPOFOL N/A 04/21/2016   Procedure: ESOPHAGOGASTRODUODENOSCOPY (EGD) WITH PROPOFOL;  Surgeon: Jerene Bears, MD;  Location: [facility];  Service: Gastroenterology;  Laterality: N/A;  . FOOT FRACTURE SURGERY Right ~  2003  . FRACTURE SURGERY    . ICD IMPLANT N/A 08/03/2016   Procedure: ICD Implant;  Surgeon: Deboraha Sprang, MD;  Location: [facility];  Service: Cardiovascular;  Laterality: N/A;  . LAPAROSCOPIC CHOLECYSTECTOMY  12/2006  . LAPAROSCOPIC GASTRIC SLEEVE RESECTION    . LAPAROSCOPY ABDOMEN DIAGNOSTIC  2008   "cut bile duct w/gallbladder OR; had to go in later & fix leak; hospitalized for 2 months"  . LEFT HEART CATHETERIZATION WITH CORONARY ANGIOGRAM N/A 02/26/2014   Procedure: LEFT HEART CATHETERIZATION WITH  CORONARY ANGIOGRAM;  Surgeon: Jettie Booze, MD;  Location: [facility];  Service: Cardiovascular;  Laterality: N/A;  . OVARIAN CYST REMOVAL Right 1999  . RIGHT HEART CATH N/A 08/05/2017   Procedure: RIGHT HEART CATH;  Surgeon: Jolaine Artist, MD;  Location: [facility];  Service: Cardiovascular;  Laterality: N/A;  . TUBAL LIGATION  1999      Inpatient Medications: Scheduled Meds: . carvedilol  12.5 mg Oral BID WC  . enoxaparin (LOVENOX) injection  40 mg Subcutaneous Q24H  . ivabradine  7.5 mg Oral BID WC  . linaclotide  145 mcg Oral QAC breakfast  . pantoprazole  40 mg Oral Daily  . [START ON 11/24/2017] potassium chloride  20 mEq Oral BID  . sacubitril-valsartan  1 tablet Oral BID  . sodium chloride flush  3 mL Intravenous Q12H  . spironolactone  25 mg Oral Daily  . torsemide  40 mg Oral Daily   Continuous Infusions:  PRN Meds: gabapentin, nitroGLYCERIN  Allergies:    Allergies  Allergen Reactions  . Aspirin Anaphylaxis and Other (See Comments)    Wheezing, "but patient still takes if she needs to"  . Vancomycin Other (See Comments)    "did something to my kidneys," PROGRESSED TO KIDNEY FAILURE!!  . Contrast Media [Iodinated Diagnostic Agents] Other (See Comments)    Multiple CT contrast studies done over 2 weeks caused ARF  . Avocado Itching and Swelling  . Ciprofloxacin Itching and Rash  . Sulfa Antibiotics Itching and Rash    Social History:   Social History   Socioeconomic History  . Marital status: Married    Spouse name: Not on file  . Number of children: 1  . Years of education: Not on file  . Highest education level: Not on file  Occupational History  . Occupation: stay at home mom  Social Needs  . Financial resource strain: Not on file  . Food insecurity:    Worry: Not on file    Inability: Not on file  . Transportation needs:    Medical: Not on file    Non-medical: Not on file  Tobacco Use  . Smoking status: Never Smoker  .  Smokeless tobacco: Never Used  Substance and Sexual Activity  . Alcohol use: No  . Drug use: No  . Sexual activity: Yes  Lifestyle  . Physical activity:    Days per week: Not on file    Minutes per session: Not on file  . Stress: Not on file  Relationships  . Social connections:    Talks on phone: Not on file    Gets together: Not on file    Attends religious service: Not on file    Active member of club or organization: Not on file    Attends meetings of clubs or organizations: Not on file    Relationship status: Not on file  . Intimate partner violence:    Fear of current  or ex partner: Not on file    Emotionally abused: Not on file    Physically abused: Not on file    Forced sexual activity: Not on file  Other Topics Concern  . Not on file  Social History Narrative  . Not on file    Family History:   Family History  Problem Relation Age of Onset  . Emphysema Maternal Grandmother        smoked  . Heart disease Maternal Grandmother 27       MI  . Rheum arthritis Mother   . Allergies Daughter   . Colon cancer Neg Hx      ROS:  Please see the history of present illness.  All other ROS reviewed and negative.     Physical Exam/Data:   Vitals:   11/22/17 1831 11/22/17 1935 11/23/17 0413 11/23/17 0752  BP: 98/62 (!) 96/51 (!) 96/43 (!) 83/42  Pulse: 85 90 67 64  Resp:  18 18 16   Temp:  98.4 F (36.9 C) 98.1 F (36.7 C) 97.9 F (36.6 C)  TempSrc:  Oral Oral Oral  SpO2:  93% 98% 98%  Weight:   206 lb 9.6 oz (93.7 kg)   Height:        Intake/Output Summary (Last 24 hours) at 11/23/2017 1002 Last data filed at 11/23/2017 0600 Gross per 24 hour  Intake 518 ml  Output 1850 ml  Net -1332 ml   Filed Weights   11/21/17 1738 11/21/17 2347 11/23/17 0413  Weight: 208 lb (94.3 kg) 211 lb 1.6 oz (95.8 kg) 206 lb 9.6 oz (93.7 kg)   Body mass index is 34.92 kg/m.  General:  Well nourished, well developed, in no acute distress HEENT: normal Lymph: no  adenopathy Neck: no JVD Endocrine:  No thryomegaly Vascular: No carotid bruits  Cardiac:  RRR; no murmurs, gallops or rubs Lungs:  CTA b/l, no wheezing, rhonchi or rales  Abd: soft, nontender  Ext: no edema Musculoskeletal:  No deformities Skin: warm and dry  Neuro:   No gross focal abnormalities noted Psych:  Normal affect   EKG:  The EKG was personally reviewed and demonstrates:   SR, 87bpm, PVCs Telemetry:  Telemetry was personally reviewed and demonstrates:   SR 60's-80's, PVCs are anywhere from periods of bigeminy/trigeminy to occassional  Relevant CV Studies:  11/22/17: TTE Study Conclusions - Left ventricle: The cavity size was normal. Wall thickness was   increased in a pattern of mild LVH. Systolic function was normal.   The estimated ejection fraction was in the range of 50% to 55%.   Wall motion was normal; there were no regional wall motion   abnormalities. Doppler parameters are consistent with abnormal   left ventricular relaxation (grade 1 diastolic dysfunction). - Mitral valve: There was mild regurgitation. - Left atrium: The atrium was moderately dilated.  11/09/17: 48hr monitor 1. NSR with frequent PVCs 30% (with one predominant morphology) 2. Brief run NSVT Will need PVC suppression versus ablation.   CPX testing Feb/2018, submax test done in setting of missing her am meds. Shows moderate limitation due mostly to obesity with some HF component  08/05/17: RHC Findings: RA = 2 RV = 26/7 PA = 28/13 (19) PCW = 5 Fick cardiac output/index = 5.7/2.8 PVR = 2.5 WU Ao sat = 99% PA sat = 71%, 72% High SVC sat = 65% Assessment: 1. Normal hemodynamics 2. No evidence of intracardiac shunt  01/07/17: TTE Study Conclusions - Left ventricle: The cavity size was  severely dilated. Wall   thickness was normal. The estimated ejection fraction was 25%.   Diffuse hypokinesis. Doppler parameters are consistent with both   elevated ventricular end-diastolic filling  pressure and elevated   left atrial filling pressure. - Mitral valve: There was mild regurgitation. - Left atrium: The atrium was mildly dilated.  Cath 6/17 revealed normal vessels, moderate elevation in pulmonary artery pressures, and LVEF 25-30%.    Laboratory Data:  Chemistry Recent Labs  Lab 11/21/17 1750 11/22/17 0039 11/23/17 0424  NA 140 140 138  K 3.7 3.3* 3.5  CL 105 106 101  CO2 28 28 28   GLUCOSE 95 113* 95  BUN 9 8 11   CREATININE 0.81 0.84 1.08*  CALCIUM 9.7 9.2 8.9  GFRNONAA >60 >60 >60  GFRAA >60 >60 >60  ANIONGAP 7 6 9     Recent Labs  Lab 11/21/17 2155 11/22/17 0039  PROT 5.9* 6.2*  ALBUMIN 3.3* 3.3*  AST 32 16  ALT 12 12  ALKPHOS 59 59  BILITOT 1.1 0.8   Hematology Recent Labs  Lab 11/21/17 1750 11/22/17 0039  WBC 6.4 6.5  RBC 4.16 4.04  HGB 12.3 11.8*  HCT 38.4 37.0  MCV 92.3 91.6  MCH 29.6 29.2  MCHC 32.0 31.9  RDW 13.5 13.5  PLT 210 194   Cardiac Enzymes Recent Labs  Lab 11/22/17 0039  TROPONINI <0.03    Recent Labs  Lab 11/21/17 1808  TROPIPOC 0.00    BNP Recent Labs  Lab 11/21/17 2155  BNP 181.2*    DDimer  Recent Labs  Lab 11/21/17 2155  DDIMER 0.62*    Radiology/Studies:   Dg Chest 2 View Result Date: 11/21/2017 CLINICAL DATA:  Chest pain EXAM: CHEST - 2 VIEW COMPARISON:  CT 04/29/2017, radiograph 04/28/2017 FINDINGS: Similar appearance of left-sided pacing device. No acute airspace disease or pleural effusion. Stable cardiomediastinal silhouette. No pneumothorax. Surgical changes in the upper abdomen. IMPRESSION: No active cardiopulmonary disease. Electronically Signed   By: Donavan Foil M.D.   On: 11/21/2017 18:52    Nm Pulmonary Perf And Vent Result Date: 11/22/2017 CLINICAL DATA:  Chest pain and shortness of breath EXAM: NUCLEAR MEDICINE VENTILATION - PERFUSION LUNG SCAN VIEWS: Anterior, posterior, left lateral, right lateral, RPO, LPO, RAO, LAO-ventilation and perfusion RADIOPHARMACEUTICALS:  31.4 mCi of  Tc-68m DTPA aerosol inhalation and 4.3 mCi Tc19m-MAA IV COMPARISON:  Chest radiograph November 21, 2017 FINDINGS: Ventilation: Radiotracer uptake bilaterally is homogeneous and symmetric bilaterally. No ventilation defects are appreciable. Perfusion: Radiotracer uptake bilaterally is homogeneous and symmetric. No perfusion defects are evident. IMPRESSION: No appreciable ventilation or perfusion defects. This study constitutes a very low probability of pulmonary embolus. Electronically Signed   By: Lowella Grip III M.D.   On: 11/22/2017 08:29    Assessment and Plan:   1. PVC's     Holter report noted appeared a single foci     Will review EKG with Dr. Lovena Le  AAD options: Would avoid amiodarone given young age Her LV is not normal, I don't think Flecainide is an option BP will limit titration of her BB further    Ablation may be a good option for her, Dr. Lovena Le will review and see.   For questions or updates, please contact [facility] [place] Please consult [email or web address] for contact info under Cardiology/STEMI.   Signed, Baldwin Jamaica, PA-C  11/23/2017 10:02 AM  EP Attending  Patient seen and examined. Agree with above. The patient is doing well and her PVC's have quieted down.  I have reviewed the tele and holter findings and would suggest EP study and catheter ablation of her PVC"s as an outptatient. Unclear if these are originating from the RV or LV outflow tract or elsewhere. Medical options for PVC prevention are limited by her other comorbidities and young age. We will review our schedule, allow her to be discharged home and scheduled for ablation next week.  Mikle Bosworth.D.

## 2017-11-23 NOTE — Progress Notes (Signed)
Pt placing self on CPAP- no issues noted at time of check 

## 2017-11-23 NOTE — Progress Notes (Addendum)
Advanced Heart Failure Rounding Note  PCP-Cardiologist: No primary care provider on file.   Subjective:    Echo 11/22/17 LVEF 50-55%, Grade 1 DD, Mild MR, Mod LAE.   Feeling somewhat better this am. No further chest pressure. Denies lightheadedness or dizziness despite soft pressures. PVC burden improved after one dose amio.   Down 5 lbs from admit. Creatinine 1.08. K 3.5.  Objective:   Weight Range: 206 lb 9.6 oz (93.7 kg) Body mass index is 34.92 kg/m.   Vital Signs:   Temp:  [97.9 F (36.6 C)-98.4 F (36.9 C)] 97.9 F (36.6 C) (07/02 0752) Pulse Rate:  [41-90] 64 (07/02 0752) Resp:  [16-18] 16 (07/02 0752) BP: (83-105)/(35-66) 83/42 (07/02 0752) SpO2:  [93 %-98 %] 98 % (07/02 0752) Weight:  [206 lb 9.6 oz (93.7 kg)] 206 lb 9.6 oz (93.7 kg) (07/02 0413) Last BM Date: 11/22/17  Weight change: Filed Weights   11/21/17 1738 11/21/17 2347 11/23/17 0413  Weight: 208 lb (94.3 kg) 211 lb 1.6 oz (95.8 kg) 206 lb 9.6 oz (93.7 kg)    Intake/Output:   Intake/Output Summary (Last 24 hours) at 11/23/2017 0851 Last data filed at 11/23/2017 0600 Gross per 24 hour  Intake 518 ml  Output 1850 ml  Net -1332 ml      Physical Exam    General: Well appearing. No resp difficulty. Ambulating in room.  HEENT: Normal anicteric Neck: Supple. JVP 5-6. Carotids 2+ bilat; no bruits. No thyromegaly or nodule noted. Cor: PMI nondisplaced. RRR, No M/G/R noted. ++ ectopy.  Lungs: CTAB, normal effort.  No wheeze Abdomen: obese Soft, non-tender, non-distended, no HSM. No bruits or masses. +BS  Extremities: no cyanosis, clubbing, rash, edema Neuro: alert & oriented x 3, cranial nerves grossly intact. moves all 4 extremities w/o difficulty. Affect pleasant   Telemetry   NSR with frequent PVCs overnight, though markedly less PVCs this am, personally reviewed.  EKG    No new tracings.    Labs    CBC Recent Labs    11/21/17 1750 11/22/17 0039  WBC 6.4 6.5  HGB 12.3 11.8*  HCT  38.4 37.0  MCV 92.3 91.6  PLT 210 741   Basic Metabolic Panel Recent Labs    11/22/17 0039 11/23/17 0424  NA 140 138  K 3.3* 3.5  CL 106 101  CO2 28 28  GLUCOSE 113* 95  BUN 8 11  CREATININE 0.84 1.08*  CALCIUM 9.2 8.9   Liver Function Tests Recent Labs    11/21/17 2155 11/22/17 0039  AST 32 16  ALT 12 12  ALKPHOS 59 59  BILITOT 1.1 0.8  PROT 5.9* 6.2*  ALBUMIN 3.3* 3.3*   No results for input(s): LIPASE, AMYLASE in the last 72 hours. Cardiac Enzymes Recent Labs    11/22/17 0039  TROPONINI <0.03    BNP: BNP (last 3 results) Recent Labs    01/17/17 1434 04/28/17 1742 11/21/17 2155  BNP 298.0* 478.0* 181.2*    ProBNP (last 3 results) No results for input(s): PROBNP in the last 8760 hours.   D-Dimer Recent Labs    11/21/17 2155  DDIMER 0.62*   Hemoglobin A1C No results for input(s): HGBA1C in the last 72 hours. Fasting Lipid Panel No results for input(s): CHOL, HDL, LDLCALC, TRIG, CHOLHDL, LDLDIRECT in the last 72 hours. Thyroid Function Tests No results for input(s): TSH, T4TOTAL, T3FREE, THYROIDAB in the last 72 hours.  Invalid input(s): FREET3  Other results:   Imaging  No results found.   Medications:     Scheduled Medications: . carvedilol  12.5 mg Oral BID WC  . enoxaparin (LOVENOX) injection  40 mg Subcutaneous Q24H  . ivabradine  7.5 mg Oral BID WC  . linaclotide  145 mcg Oral QAC breakfast  . pantoprazole  40 mg Oral Daily  . [START ON 11/24/2017] potassium chloride  20 mEq Oral BID  . potassium chloride  40 mEq Oral TID  . sacubitril-valsartan  1 tablet Oral BID  . sodium chloride flush  3 mL Intravenous Q12H  . spironolactone  25 mg Oral Daily  . torsemide  40 mg Oral Daily    Infusions:   PRN Medications: gabapentin, nitroGLYCERIN    Patient Profile   [name] is a 42 y.o. female with HRN, obesity, DM2, depression, OSA on CPAP, and chronic diastolic CHF  Assessment/Plan   1. Atypical Chest  pain - Resolved.  - Troponins negative. VQ scan negative.  - ? If could be related to ++ PVCs. Starting anti-arrhythmic therapy as below.  - No change to current plan.    2. Acute on chronic systolic/diastolic CHF - Echo 07/30/73 LVEF 50-55%, Grade 1 DD, Mild MR, Mod LAE.  Improved from 25-30% in 12/2016 - CPX 12/18 showed moderate limitation due mostly to obesity with some HF component - Volume status OK on exam.  - Continue torsemide 40 mg daily for now.  - Continue Entresto 49/51 mg BID - Continue Coreg 12.5 mg BID   - Continue Spiro 25 mg daily.  - Continue corlanor 7.5mg  BID for now.   3. Morbid obesity - s/p Gastric Sleep at [facility] 09/20/17  - Body mass index is 34.92 kg/m. Continues to lose weight.   3. OSA - Encouraged nightly CPAP. No change.   4. DM2 - Per Primary.   5. Frequent PVCs/ Bigeminy - Holter Monitor 11/09/17 with 30% PVCs with one primary morphology.  - Having 17-39 PVCs a minute by tele. - With improvement in EF, could consider flecainide vs amiodarone, vs primary ablation - Await EP input for antiarrythmic approach.   6. Hypokalemia - K 3.5 this am. Supp.   Possibly home today pending EP recommendations.  Medication concerns reviewed with patient and pharmacy team. Barriers identified: None at this time.  Length of Stay: 0  [name]  11/23/2017, 8:51 AM  Advanced Heart Failure Team Pager [phone] (M-F; 7a - 4p)  Please contact [facility] for night-coverage after hours (4p -7a ) and weekends on [email or web address]  Patient seen and examined with the above-signed Advanced Practice Provider and/or Housestaff. I personally reviewed laboratory data, imaging studies and relevant notes. I independently examined the patient and formulated the important aspects of the plan. I have edited the note to reflect any of my changes or salient points. I have personally discussed the plan with the patient and/or family.  She is much improved today.  Volume status looks better. PVC burden reduce after diuresed and one dose of amio. I discussed the case with EP and they feel catheter ablation of PVCs is the best option. I agree particularly given her young age. She can be discharged home and we will arrange as an outpatient. We will sign off. Will also arrange f/u in HF Clinic.   HF d/c meds to include  - Continue torsemide 40 mg daily take extra as needed for weight gain - Continue Entresto 49/51 mg BID - Continue Coreg 12.5 mg BID   -  Continue Spiro 25 mg daily.  - Continue corlanor 7.5mg  BID   Glori Bickers, MD  6:23 PM

## 2017-11-23 NOTE — Care Management Note (Signed)
Case Management Note  Patient Details  Name: [name] MRN: [id number] Date of Birth: [birth year]  Subjective/Objective:    Chest Pain              Action/Plan: Patient is independent of all of her ADL's; lives at home with spouse. Has a 44 yr old child;  PCP: Hoyt Koch, MD; has private insurance with BCBS / Medicare, on disability; has prescription drug coverage; pharmacy of choice is [facility]; patient reports no problem getting medication. No DME. CM will continue to follow for progression of care.  Expected Discharge Date:   possibly 11/26/2017               Expected Discharge Plan:  Home/Self Care  Discharge planning Services  CM Consult  Status of Service:  In process, will continue to follow  Sherrilyn Rist [phone] 11/23/2017, 10:04 AM

## 2017-11-23 NOTE — Progress Notes (Signed)
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  She presented to the emergency room tonight for chest pressure.  This is a recurrent problem for her, she states that she is previously discussed this with Dr. Haroldine Laws which led to her heart catheterization in 2017 which showed normal coronary arteries.  Past Medical History:  Diagnosis Date  . AICD (automatic cardioverter/defibrillator) present   . Anxiety   . Arthritis    right shoulder   . Asthma   . CHF (congestive heart failure) ([place])   . Depression   . Diabetes mellitus without complication ([place])   . Diverticulosis   . Dyspnea    comes and goes intermittently mostly with exertion   . Fibroid    age 20  . Gallstones   . Hypertension   . IBS (irritable bowel syndrome)   . Migraine    "monthly" (08/03/2016)  . OSA on CPAP   . Ovarian cyst    1999; surgically removed  . 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 Seizures ([place])    "as a child" (08/03/2016)  . Termination of pregnancy    due to cardiac risk    Social History   Socioeconomic History  . Marital status: Married    Spouse name: Not on file  . Number of children: 1  . Years of education: Not on file  . Highest education level: Not on file  Occupational History  . Occupation: stay at home mom  Social Needs  . Financial resource strain: Not on file  . Food insecurity:    Worry: Not on file    Inability: Not on file  . Transportation needs:    Medical: Not on file    Non-medical: Not on file  Tobacco Use  . Smoking status: Never Smoker  . Smokeless tobacco: Never Used  Substance and Sexual Activity  . Alcohol use: No  . Drug use: No  . Sexual activity: Yes  Lifestyle  .  Physical activity:    Days per week: Not on file    Minutes per session: Not on file  . Stress: Not on file  Relationships  . Social connections:    Talks on phone: Not on file    Gets together: Not on file    Attends religious service: Not on file    Active member of club or organization: Not on file    Attends meetings of clubs or organizations: Not on file    Relationship status: Not on file  Other Topics Concern  . Not on file  Social History Narrative  . Not on file    ECHO:Study Conclusions--11/22/17  - Left ventricle: The cavity size was normal. Wall thickness was   increased in a pattern of mild LVH. Systolic function was normal.   The estimated ejection fraction was in the range of 50% to 55%.   Wall motion was normal; there were no regional wall motion   abnormalities. Doppler parameters are consistent with abnormal   left ventricular relaxation (grade 1 diastolic dysfunction). - Mitral valve: There was mild regurgitation. - Left atrium: The atrium was moderately dilated.  ------------------------------------------------------------------- Study data:  Comparison was made to the study of 01/07/2017.  Study status:  Routine.  Procedure:  The patient&'s  pain level was 6 on a scale of 1 to 10. Transthoracic echocardiography. Image quality was good.  Study completion:  There were no complications.   BNP    Component Value Date/Time   BNP 181.2 (H) 11/21/2017 2155    ProBNP    Component Value Date/Time   PROBNP 933.6 (H) 02/20/2014 1510     Education Assessment and Provision:  Detailed education and instructions provided on heart failure disease management including the following:  Signs and symptoms of Heart Failure When to call the physician Importance of daily weights Low sodium diet Fluid restriction Medication management Anticipated future follow-up appointments  Patient education given on each of the above topics.  Patient acknowledges  understanding and acceptance of all instructions.  I spoke with [name] regarding her HF and current hospitalization.  She tells me that she has had HF and been taking HF medications for years.  She seems encouraged that her newest echo revealed an improved EF.  She does say that she weighs each day and is aware when to call the physician with any worsening symptoms.  She denies any issues with getting or taking prescribed medications.  She will follow with the AHF clinic after discharge.    Education Materials:  "Living Better With Heart Failure" Booklet, Daily Weight Tracker Tool    High Risk Criteria for Readmission and/or Poor Patient Outcomes:   EF <30%- Improved to 50-55%  2 or more admissions in 6 months-No  Difficult social situation-No  Demonstrates medication noncompliance-denies  Barriers of Care:  Knowledge  Discharge Planning:   Plans to return to home with husband.

## 2017-11-23 NOTE — Progress Notes (Signed)
[facility] Progress Note  Subjective: no new c/o, no SOB or CP  Vitals:   11/22/17 1935 11/23/17 0413 11/23/17 0752 11/23/17 1151  BP: (!) 96/51 (!) 96/43 (!) 83/42 123/68  Pulse: 90 67 64 66  Resp: 18 18 16    Temp: 98.4 F (36.9 C) 98.1 F (36.7 C) 97.9 F (36.6 C) 98.4 F (36.9 C)  TempSrc: Oral Oral Oral Oral  SpO2: 93% 98% 98% 99%  Weight:  93.7 kg (206 lb 9.6 oz)    Height:        Inpatient medications: . carvedilol  12.5 mg Oral BID WC  . enoxaparin (LOVENOX) injection  40 mg Subcutaneous Q24H  . ivabradine  7.5 mg Oral BID WC  . linaclotide  145 mcg Oral QAC breakfast  . pantoprazole  40 mg Oral Daily  . [START ON 11/24/2017] potassium chloride  20 mEq Oral BID  . sacubitril-valsartan  1 tablet Oral BID  . sodium chloride flush  3 mL Intravenous Q12H  . spironolactone  25 mg Oral Daily  . torsemide  40 mg Oral Daily    gabapentin, nitroGLYCERIN  Exam: Calm, alert in no distress  no jvd Chest cta bilat RRR no mrg Abd obese soft ntnd Ext no edema of ext NF, Ox 3   Presenting Summary: This is a 42 year old woman with medical problems including obesity, nonischemic cardiomyopathy with reduced EF, obesity status post gastric sleeve within the past year, OSA presented with chest pain.  Chest pain occurred evening prior to admission, described as hard center of chest, rates it 8 out of 10, nonradiating.She reported she's never had a blood clot before. She reports her activity is limited by her dyspnea on exertion which is stable as well as easy fatigability. She reported being independent in her ADLs and needs some assistance with some of her IADLs. She does not work, she formerly served in [employer], her daughter is 25 year old.  She reports having a left heart cath within the past year at week for switch did not reveal obstructive coronary disease. ED Course: vital signs showed heart rate ranging from the 40s to 80s, respiratory rate of 27 which stent trended  to normal, systolic blood pressure over 100 mmHg.  Chest x-ray did not reveal acute cardiopulmonary abnormality.  EKG did not reveal ST segment elevation.  CMP markable for albumin of 3.3. BNP of 181.  CBC normal. Cardiology consulted for conditions, hospital medicine consulted for admission.         Plan / Hospital Course:  1) Frequent PVC's/ Bigeminy: holter monitor showed 30% PVC's, lots of PVC"s on telemetry per cards consult. May require medication (amio or flecainide) or possibly ablation - cardiology / EP team in process of evaluating  2) Chest pain- resolved, troponins negative. VQ negative.  Possibly related to PVC's per cardiology consult.  Starting amiodarone, see below.   3) Acute on chron syst/ diast CHF:  - echo showed LVEF 50-55%, improved from 25-30% in aug 2018 - per cards CXR limited d/t obesity w/ some HF component but no vol excess on exam - per cards consult continue entresto/ coreg/ spiro/ corlanor for now - continuing torsemide 40 mg qd  4) SP AICD 3/18  5) Hypokalemia: replaced, better, checked Mg is 2.1 ok   DVT prophylaxis: Subq Lovenox Code Status: Full code Disposition Plan: Anticipate D/C home in 2-5 days Consults called: cardiology consult Admission status: observation   Kelly Splinter MD [facility] pgr [phone] 11/23/2017, 12:58  PM   Recent Labs  Lab 11/21/17 1750 11/22/17 0039 11/23/17 0424  NA 140 140 138  K 3.7 3.3* 3.5  CL 105 106 101  CO2 28 28 28   GLUCOSE 95 113* 95  BUN 9 8 11   CREATININE 0.81 0.84 1.08*  CALCIUM 9.7 9.2 8.9   Recent Labs  Lab 11/21/17 2155 11/22/17 0039  AST 32 16  ALT 12 12  ALKPHOS 59 59  BILITOT 1.1 0.8  PROT 5.9* 6.2*  ALBUMIN 3.3* 3.3*   Recent Labs  Lab 11/21/17 1750 11/22/17 0039  WBC 6.4 6.5  HGB 12.3 11.8*  HCT 38.4 37.0  MCV 92.3 91.6  PLT 210 194   Iron/TIBC/Ferritin/ %Sat    Component Value Date/Time   FERRITIN 20.3 12/25/2013 0937

## 2017-11-24 DIAGNOSIS — R0789 Other chest pain: Secondary | ICD-10-CM | POA: Diagnosis not present

## 2017-11-24 DIAGNOSIS — R079 Chest pain, unspecified: Secondary | ICD-10-CM | POA: Diagnosis not present

## 2017-11-24 LAB — BASIC METABOLIC PANEL
Anion gap: 8 (ref 5–15)
BUN: 12 mg/dL (ref 6–20)
CO2: 27 mmol/L (ref 22–32)
Calcium: 8.9 mg/dL (ref 8.9–10.3)
Chloride: 102 mmol/L (ref 98–111)
Creatinine, Ser: 1.02 mg/dL — ABNORMAL HIGH (ref 0.44–1.00)
GFR calc Af Amer: >60 mL/min (ref >60)
GFR calc non Af Amer: >60 mL/min (ref >60)
Glucose, Bld: 92 mg/dL (ref 70–99)
Potassium: 3.9 mmol/L (ref 3.5–5.1)
Sodium: 137 mmol/L (ref 135–145)

## 2017-11-24 NOTE — Discharge Summary (Signed)
Physician Discharge Summary  [name] XNA:[id number] DOB: [birth year] DOA: 11/21/2017  PCP: Hoyt Koch, MD  Admit date: 11/21/2017 Discharge date: 11/24/2017  Admitted From: Home Disposition: Home Recommendations for Outpatient Follow-up:  1. Follow up with PCP in 1-2 weeks 2. Please obtain BMP/CBC in one week 3. Patient to follow-up with Dr. Crissie Sickles 12/02/2017 for ablation.  The appointment has already been made and is in the AVS.  Home Health: None Equipment/Devices: None  Discharge Condition: Stable CODE STATUS full code Diet recommendation cardiac Brief/Interim Summary:42 year old woman with medical problems including obesity, nonischemic cardiomyopathy with reduced EF, obesity status post gastric sleeve within the past year, OSA presented with chest pain.  Chest pain occurred evening prior to admission, described as hard center of chest, rates it 8 out of 10, nonradiating.She reported she's never had a blood clot before. She reports her activity is limited by her dyspnea on exertion which is stable as well as easy fatigability. She reported being independent in her ADLs and needs some assistance with some of her IADLs. She does not work, she formerly served in [employer], her daughter is 6 year old. She reports having a left heart cath within the past year at week for switch did not reveal obstructive coronary disease. ED Course:vital signs showed heart rate ranging from the 40s to 80s, respiratory rate of 27 which stent trended to normal, systolic blood pressure over 100 mmHg. Chest x-ray did not reveal acute cardiopulmonary abnormality. EKG did not reveal ST segment elevation. CMP markable for albumin of 3.3. BNP of 181. CBC normal. Cardiology consulted for conditions, hospital medicine consulted for admission.    Discharge Diagnoses:  Active Problems:   Chest pain  1] frequent PVCs and bigeminy-Holter monitor showed 30% PVCs.  Seen by cardiology and EP Dr.  Lovena Le.  Plan is to continue her home cardiac regime and then follow-up with them on 12/02/2017 for ablation.  Patient agrees with the plan.  Patient also had chest pain upon admission which was thought to be related to PVCs.  Troponins were negative VQ scan was negative.  She was not started on amiodarone due to her young age and being on multiple other cardiac medications with a soft blood pressure.  Patient is status post AICD 08/09/2017.  2] acute on chronic diastolic CHF patient's ejection fraction improved to 50 to 55% from 25 to 30% a year ago.  Continue home dose of torsemide, Entresto, Coreg, Corlanor, and a spironolactone.  Discharge Instructions  Discharge Instructions    Call MD for:  difficulty breathing, headache or visual disturbances   Complete by:  As directed    Call MD for:  persistant dizziness or light-headedness   Complete by:  As directed    Call MD for:  persistant nausea and vomiting   Complete by:  As directed    Call MD for:  severe uncontrolled pain   Complete by:  As directed    Call MD for:  temperature >100.4   Complete by:  As directed    Diet - low sodium heart healthy   Complete by:  As directed    Increase activity slowly   Complete by:  As directed      Allergies as of 11/24/2017      Reactions   Aspirin Anaphylaxis, Other (See Comments)   Wheezing, "but patient still takes if she needs to"   Vancomycin Other (See Comments)   "did something to my kidneys," PROGRESSED TO KIDNEY FAILURE!!   Contrast  Media [iodinated Diagnostic Agents] Other (See Comments)   Multiple CT contrast studies done over 2 weeks caused ARF   Avocado Itching, Swelling   Ciprofloxacin Itching, Rash   Sulfa Antibiotics Itching, Rash      Medication List    STOP taking these medications   cyclobenzaprine 10 MG tablet Commonly known as:  FLEXERIL   Diclofenac Sodium 2 % Soln     TAKE these medications   albuterol 108 (90 Base) MCG/ACT inhaler Commonly known as:   PROVENTIL HFA;VENTOLIN HFA Inhale 1-2 puffs into the lungs every 6 (six) hours as needed for wheezing or shortness of breath. **PT NEEDS TO ESTABLISH WITH ANOTHER PROVIDER FOR ADDITIONAL REFILLS** What changed:  additional instructions   blood glucose meter kit and supplies Dispense based on patient and insurance preference. Use up to two times daily as directed. (FOR ICD-10 E10.9, E11.9).   carvedilol 12.5 MG tablet Commonly known as:  COREG Take 1 tablet (12.5 mg total) by mouth 2 (two) times daily with a meal.   CORLANOR 7.5 MG Tabs tablet Generic drug:  ivabradine TAKE 1 TABLET (7.5 MG TOTAL) BY MOUTH 2 (TWO) TIMES DAILY WITH A MEAL.   dicyclomine 20 MG tablet Commonly known as:  BENTYL Take 20 mg by mouth 2 (two) times daily as needed for spasms.   gabapentin 100 MG capsule Commonly known as:  NEURONTIN Take 200 mg by mouth at bedtime as needed (for pain).   linaclotide 145 MCG Caps capsule Commonly known as:  LINZESS Take 1 capsule (145 mcg total) by mouth daily before breakfast.   MULTIVITAMIN GUMMIES WOMENS PO Take 2 each by mouth daily.   nitroGLYCERIN 0.4 MG SL tablet Commonly known as:  NITROSTAT PLACE 1 TABLET (0.4 MG TOTAL) UNDER THE TONGUE EVERY 5 (FIVE) MINUTES AS NEEDED FOR CHEST PAIN.   omeprazole 20 MG capsule Commonly known as:  PRILOSEC Take 20 mg by mouth daily.   potassium chloride SA 20 MEQ tablet Commonly known as:  K-DUR,KLOR-CON Take 1 tablet (20 mEq total) by mouth daily. Take extra tablet on days you take Torsemide. What changed:    how much to take  when to take this  additional instructions   promethazine 25 MG tablet Commonly known as:  PHENERGAN Take 25 mg by mouth every 6 (six) hours as needed for nausea or vomiting.   RISEDRONATE SODIUM PO Take 1 capsule by mouth every 28 (twenty-eight) days.   sacubitril-valsartan 49-51 MG Commonly known as:  ENTRESTO Take 1 tablet by mouth 2 (two) times daily.   spironolactone 25 MG  tablet Commonly known as:  ALDACTONE Take 25 mg by mouth daily.   torsemide 20 MG tablet Commonly known as:  DEMADEX Take 2 tablets (40 mg total) by mouth daily.   Vitamin D (Ergocalciferol) 50000 units Caps capsule Commonly known as:  DRISDOL TAKE 1 CAPSULE (50,000 UNITS TOTAL) BY MOUTH EVERY 7 (SEVEN) DAYS. What changed:  when to take this   zolpidem 5 MG tablet Commonly known as:  AMBIEN Take 1 tablet (5 mg total) by mouth at bedtime as needed for sleep.      Follow-up Information    [facility] Follow up on 12/02/2017.   Specialty:  Cardiology Why:  at 1130 for post hospital. Code for parking 1400 Contact information: [address] [id number] [place]       Hoyt Koch, MD. Go on 12/01/2017.   Specialty:  Internal  Medicine Why:  @10 :00am please arrive 15 minutes early Contact information: [place] [phone] [phone]          Allergies  Allergen Reactions  . Aspirin Anaphylaxis and Other (See Comments)    Wheezing, "but patient still takes if she needs to"  . Vancomycin Other (See Comments)    "did something to my kidneys," PROGRESSED TO KIDNEY FAILURE!!  . Contrast Media [Iodinated Diagnostic Agents] Other (See Comments)    Multiple CT contrast studies done over 2 weeks caused ARF  . Avocado Itching and Swelling  . Ciprofloxacin Itching and Rash  . Sulfa Antibiotics Itching and Rash    Consultations: chmg and ep  Procedures/Studies: Dg Chest 2 View  Result Date: 11/21/2017 CLINICAL DATA:  Chest pain EXAM: CHEST - 2 VIEW COMPARISON:  CT 04/29/2017, radiograph 04/28/2017 FINDINGS: Similar appearance of left-sided pacing device. No acute airspace disease or pleural effusion. Stable cardiomediastinal silhouette. No pneumothorax. Surgical changes in the upper abdomen. IMPRESSION: No active cardiopulmonary disease. Electronically  Signed   By: Donavan Foil M.D.   On: 11/21/2017 18:52   Nm Pulmonary Perf And Vent  Result Date: 11/22/2017 CLINICAL DATA:  Chest pain and shortness of breath EXAM: NUCLEAR MEDICINE VENTILATION - PERFUSION LUNG SCAN VIEWS: Anterior, posterior, left lateral, right lateral, RPO, LPO, RAO, LAO-ventilation and perfusion RADIOPHARMACEUTICALS:  31.4 mCi of Tc-54mDTPA aerosol inhalation and 4.3 mCi Tc962mAA IV COMPARISON:  Chest radiograph November 21, 2017 FINDINGS: Ventilation: Radiotracer uptake bilaterally is homogeneous and symmetric bilaterally. No ventilation defects are appreciable. Perfusion: Radiotracer uptake bilaterally is homogeneous and symmetric. No perfusion defects are evident. IMPRESSION: No appreciable ventilation or perfusion defects. This study constitutes a very low probability of pulmonary embolus. Electronically Signed   By: WiLowella GripII M.D.   On: 11/22/2017 08:29    (Echo, Carotid, EGD, Colonoscopy, ERCP)    Subjective:   Discharge Exam: Vitals:   11/24/17 0535 11/24/17 0800  BP: 95/63 108/61  Pulse: 63 73  Resp: 13 14  Temp: 98.5 F (36.9 C)   SpO2: 98% 98%   Vitals:   11/23/17 1630 11/23/17 2026 11/24/17 0535 11/24/17 0800  BP: (!) 112/53 99/69 95/63  108/61  Pulse: 68 (!) 58 63 73  Resp: 16 16 13 14   Temp: 98.6 F (37 C) 98.2 F (36.8 C) 98.5 F (36.9 C)   TempSrc: Oral Oral Oral   SpO2: 98% 100% 98% 98%  Weight:   93.2 kg (205 lb 8 oz)   Height:        General: Pt is alert, awake, not in acute distress Cardiovascular: RRR, S1/S2 +, no rubs, no gallops Respiratory: CTA bilaterally, no wheezing, no rhonchi Abdominal: Soft, NT, ND, bowel sounds + Extremities: no edema, no cyanosis    The results of significant diagnostics from this hospitalization (including imaging, microbiology, ancillary and laboratory) are listed below for reference.     Microbiology: No results found for this or any previous visit (from the past 240 hour(s)).    Labs: BNP (last 3 results) Recent Labs    01/17/17 1434 04/28/17 1742 11/21/17 2155  BNP 298.0* 478.0* 18749.4  Basic Metabolic Panel: Recent Labs  Lab 11/21/17 1750 11/22/17 0039 11/23/17 0424 11/24/17 0508  NA 140 140 138 137  K 3.7 3.3* 3.5 3.9  CL 105 106 101 102  CO2 28 28 28 27   GLUCOSE 95 113* 95 92  BUN 9 8 11 12   CREATININE 0.81 0.84 1.08* 1.02*  CALCIUM  9.7 9.2 8.9 8.9   Liver Function Tests: Recent Labs  Lab 11/21/17 2155 11/22/17 0039  AST 32 16  ALT 12 12  ALKPHOS 59 59  BILITOT 1.1 0.8  PROT 5.9* 6.2*  ALBUMIN 3.3* 3.3*   No results for input(s): LIPASE, AMYLASE in the last 168 hours. No results for input(s): AMMONIA in the last 168 hours. CBC: Recent Labs  Lab 11/21/17 1750 11/22/17 0039  WBC 6.4 6.5  HGB 12.3 11.8*  HCT 38.4 37.0  MCV 92.3 91.6  PLT 210 194   Cardiac Enzymes: Recent Labs  Lab 11/22/17 0039  TROPONINI <0.03   BNP: Invalid input(s): POCBNP CBG: No results for input(s): GLUCAP in the last 168 hours. D-Dimer Recent Labs    11/21/17 2155  DDIMER 0.62*   Hgb A1c No results for input(s): HGBA1C in the last 72 hours. Lipid Profile No results for input(s): CHOL, HDL, LDLCALC, TRIG, CHOLHDL, LDLDIRECT in the last 72 hours. Thyroid function studies No results for input(s): TSH, T4TOTAL, T3FREE, THYROIDAB in the last 72 hours.  Invalid input(s): FREET3 Anemia work up No results for input(s): VITAMINB12, FOLATE, FERRITIN, TIBC, IRON, RETICCTPCT in the last 72 hours. Urinalysis    Component Value Date/Time   COLORURINE YELLOW 05/28/2016 1202   APPEARANCEUR Cloudy (A) 05/28/2016 1202   LABSPEC >=1.030 (A) 05/28/2016 1202   PHURINE 6.0 05/28/2016 1202   GLUCOSEU NEGATIVE 05/28/2016 1202   HGBUR TRACE-INTACT (A) 05/28/2016 1202   BILIRUBINUR neg 10/21/2017 1325   KETONESUR NEGATIVE 05/28/2016 1202   PROTEINUR Positive (A) 10/21/2017 1325   PROTEINUR NEGATIVE 03/01/2016 1020   UROBILINOGEN 1.0 10/21/2017 1325    UROBILINOGEN 0.2 05/28/2016 1202   NITRITE neg 10/21/2017 1325   NITRITE NEGATIVE 05/28/2016 1202   LEUKOCYTESUR Large (3+) (A) 10/21/2017 1325   Sepsis Labs Invalid input(s): PROCALCITONIN,  WBC,  LACTICIDVEN Microbiology No results found for this or any previous visit (from the past 240 hour(s)).   Time coordinating discharge: 34 minutes  SIGNED:   Georgette Shell, MD  [facility] 11/24/2017, 9:54 AM Pager   If 7PM-7AM, please contact night-coverage [email or web address] Password [email or web address]

## 2017-11-24 NOTE — Discharge Instructions (Signed)
You are scheduled for your ablation procedure with Dr. Junious Silk July 10th at 10:30 AM Please arrive to [facility] by 8:30AM, at the Main entrance [facility] tower), check in at admitting desk Do not eat anything after midnight the evening prior please. Do not take your carvedilol Monday (8th), Tuesday (9th) or Wed the day of your procedure Wed July 10th the morning of your procedure, only take your Entresto, none of your other medicines. Plan to stay overnight in the hospital. Call our office with any questions [phone]

## 2017-11-24 NOTE — Progress Notes (Signed)
Discharge to home,pt alert and oriented. D/c instructions and follow up appointments discussed with pt  And verbalized understanding. Husband to transport.

## 2017-11-29 ENCOUNTER — Encounter: Payer: BLUE CROSS/BLUE SHIELD | Admitting: *Deleted

## 2017-11-29 ENCOUNTER — Telehealth: Payer: Self-pay | Admitting: Cardiology

## 2017-11-29 NOTE — Telephone Encounter (Signed)
LMOVM reminding pt to send remote transmission.   

## 2017-12-01 ENCOUNTER — Ambulatory Visit (HOSPITAL_COMMUNITY)
Admission: RE | Admit: 2017-12-01 | Discharge: 2017-12-02 | Disposition: A | Discharge status: Home / Self Care | Payer: Medicare Other | Source: Ambulatory Visit | Attending: Internal Medicine | Admitting: Internal Medicine

## 2017-12-01 ENCOUNTER — Other Ambulatory Visit: Payer: Self-pay

## 2017-12-01 ENCOUNTER — Encounter (HOSPITAL_COMMUNITY): Payer: Self-pay | Admitting: *Deleted

## 2017-12-01 ENCOUNTER — Encounter (HOSPITAL_COMMUNITY)
Admission: RE | Disposition: A | Discharge status: Home / Self Care | Payer: Self-pay | Source: Ambulatory Visit | Attending: Internal Medicine

## 2017-12-01 ENCOUNTER — Encounter: Payer: Self-pay | Admitting: Cardiology

## 2017-12-01 ENCOUNTER — Inpatient Hospital Stay: Payer: Medicare Other | Admitting: Internal Medicine

## 2017-12-01 DIAGNOSIS — I428 Other cardiomyopathies: Secondary | ICD-10-CM | POA: Insufficient documentation

## 2017-12-01 DIAGNOSIS — I110 Hypertensive heart disease with heart failure: Secondary | ICD-10-CM | POA: Insufficient documentation

## 2017-12-01 DIAGNOSIS — Z882 Allergy status to sulfonamides status: Secondary | ICD-10-CM | POA: Insufficient documentation

## 2017-12-01 DIAGNOSIS — Z86718 Personal history of other venous thrombosis and embolism: Secondary | ICD-10-CM | POA: Insufficient documentation

## 2017-12-01 DIAGNOSIS — J45909 Unspecified asthma, uncomplicated: Secondary | ICD-10-CM | POA: Insufficient documentation

## 2017-12-01 DIAGNOSIS — I5042 Chronic combined systolic (congestive) and diastolic (congestive) heart failure: Secondary | ICD-10-CM | POA: Insufficient documentation

## 2017-12-01 DIAGNOSIS — I493 Ventricular premature depolarization: Secondary | ICD-10-CM | POA: Insufficient documentation

## 2017-12-01 DIAGNOSIS — E119 Type 2 diabetes mellitus without complications: Secondary | ICD-10-CM | POA: Diagnosis not present

## 2017-12-01 DIAGNOSIS — Z6834 Body mass index (BMI) 34.0-34.9, adult: Secondary | ICD-10-CM | POA: Insufficient documentation

## 2017-12-01 DIAGNOSIS — F329 Major depressive disorder, single episode, unspecified: Secondary | ICD-10-CM | POA: Diagnosis not present

## 2017-12-01 DIAGNOSIS — M1990 Unspecified osteoarthritis, unspecified site: Secondary | ICD-10-CM | POA: Diagnosis not present

## 2017-12-01 DIAGNOSIS — K589 Irritable bowel syndrome without diarrhea: Secondary | ICD-10-CM | POA: Insufficient documentation

## 2017-12-01 DIAGNOSIS — F419 Anxiety disorder, unspecified: Secondary | ICD-10-CM | POA: Diagnosis not present

## 2017-12-01 DIAGNOSIS — E669 Obesity, unspecified: Secondary | ICD-10-CM | POA: Insufficient documentation

## 2017-12-01 DIAGNOSIS — Z95810 Presence of automatic (implantable) cardiac defibrillator: Secondary | ICD-10-CM | POA: Insufficient documentation

## 2017-12-01 DIAGNOSIS — G4733 Obstructive sleep apnea (adult) (pediatric): Secondary | ICD-10-CM | POA: Diagnosis not present

## 2017-12-01 HISTORY — PX: PVC ABLATION: EP1236

## 2017-12-01 LAB — GLUCOSE, CAPILLARY
Glucose-Capillary: 89 mg/dL (ref 70–99)
Glucose-Capillary: 76 mg/dL (ref 70–99)
Glucose-Capillary: 113 mg/dL — ABNORMAL HIGH (ref 70–99)

## 2017-12-01 LAB — POCT ACTIVATED CLOTTING TIME: Activated Clotting Time: 169 seconds

## 2017-12-01 SURGERY — PVC ABLATION

## 2017-12-01 MED ORDER — MIDAZOLAM HCL 5 MG/5ML IJ SOLN
INTRAMUSCULAR | Status: AC
  Filled 2017-12-01: qty 5

## 2017-12-01 MED ORDER — DICYCLOMINE HCL 20 MG PO TABS: 20.0000 mg | ORAL_TABLET | Freq: Two times a day (BID) | ORAL | Status: DC | PRN

## 2017-12-01 MED ORDER — HEPARIN (PORCINE) IN NACL 1000-0.9 UT/500ML-% IV SOLN
INTRAVENOUS | Status: DC | PRN
  Administered 2017-12-01 (×2): 500 mL

## 2017-12-01 MED ORDER — CARVEDILOL 12.5 MG PO TABS
12.5000 mg | ORAL_TABLET | Freq: Two times a day (BID) | ORAL | Status: DC
  Administered 2017-12-01 – 2017-12-02 (×2): 12.5 mg via ORAL
  Filled 2017-12-01 (×2): qty 1

## 2017-12-01 MED ORDER — BUPIVACAINE HCL (PF) 0.25 % IJ SOLN
INTRAMUSCULAR | Status: DC | PRN
  Administered 2017-12-01: 45 mL

## 2017-12-01 MED ORDER — FENTANYL CITRATE (PF) 100 MCG/2ML IJ SOLN
INTRAMUSCULAR | Status: AC
  Filled 2017-12-01: qty 2

## 2017-12-01 MED ORDER — ONDANSETRON HCL 4 MG/2ML IJ SOLN: 4.0000 mg | Freq: Four times a day (QID) | INTRAMUSCULAR | Status: DC | PRN

## 2017-12-01 MED ORDER — ACETAMINOPHEN 325 MG PO TABS
650.0000 mg | ORAL_TABLET | ORAL | Status: DC | PRN
  Administered 2017-12-01 (×2): 650 mg via ORAL
  Filled 2017-12-01 (×2): qty 2

## 2017-12-01 MED ORDER — TORSEMIDE 20 MG PO TABS
40.0000 mg | ORAL_TABLET | Freq: Every day | ORAL | Status: DC
  Administered 2017-12-01 – 2017-12-02 (×2): 40 mg via ORAL
  Filled 2017-12-01 (×2): qty 2

## 2017-12-01 MED ORDER — ISOPROTERENOL HCL 0.2 MG/ML IJ SOLN
INTRAVENOUS | Status: DC | PRN
  Administered 2017-12-01: 1 ug/min via INTRAVENOUS

## 2017-12-01 MED ORDER — SPIRONOLACTONE 25 MG PO TABS
25.0000 mg | ORAL_TABLET | Freq: Every day | ORAL | Status: DC
  Administered 2017-12-01 – 2017-12-02 (×2): 25 mg via ORAL
  Filled 2017-12-01 (×2): qty 1

## 2017-12-01 MED ORDER — POTASSIUM CHLORIDE CRYS ER 20 MEQ PO TBCR
20.0000 meq | EXTENDED_RELEASE_TABLET | Freq: Every day | ORAL | Status: DC
  Administered 2017-12-01 – 2017-12-02 (×2): 20 meq via ORAL
  Filled 2017-12-01 (×2): qty 1

## 2017-12-01 MED ORDER — ISOPROTERENOL HCL 0.2 MG/ML IJ SOLN
INTRAMUSCULAR | Status: AC
  Filled 2017-12-01: qty 5

## 2017-12-01 MED ORDER — HEPARIN SODIUM (PORCINE) 1000 UNIT/ML IJ SOLN
INTRAMUSCULAR | Status: AC
  Filled 2017-12-01: qty 1

## 2017-12-01 MED ORDER — FENTANYL CITRATE (PF) 100 MCG/2ML IJ SOLN
INTRAMUSCULAR | Status: DC | PRN
  Administered 2017-12-01 (×19): 12.5 ug via INTRAVENOUS

## 2017-12-01 MED ORDER — HEPARIN SODIUM (PORCINE) 1000 UNIT/ML IJ SOLN
INTRAMUSCULAR | Status: DC | PRN
  Administered 2017-12-01: 1000 [IU] via INTRAVENOUS
  Administered 2017-12-01: 6000 [IU] via INTRAVENOUS

## 2017-12-01 MED ORDER — SODIUM CHLORIDE 0.9% FLUSH
3.0000 mL | Freq: Two times a day (BID) | INTRAVENOUS | Status: DC
  Administered 2017-12-01: 3 mL via INTRAVENOUS

## 2017-12-01 MED ORDER — BUPIVACAINE HCL (PF) 0.25 % IJ SOLN
INTRAMUSCULAR | Status: AC
  Filled 2017-12-01: qty 30

## 2017-12-01 MED ORDER — IVABRADINE HCL 5 MG PO TABS: 5.0000 mg | ORAL_TABLET | Freq: Two times a day (BID) | ORAL | Status: DC

## 2017-12-01 MED ORDER — ZOLPIDEM TARTRATE 5 MG PO TABS: 5.0000 mg | ORAL_TABLET | Freq: Every evening | ORAL | Status: DC | PRN

## 2017-12-01 MED ORDER — MIDAZOLAM HCL 5 MG/5ML IJ SOLN
INTRAMUSCULAR | Status: DC | PRN
  Administered 2017-12-01 (×19): 1 mg via INTRAVENOUS

## 2017-12-01 MED ORDER — SACUBITRIL-VALSARTAN 49-51 MG PO TABS
1.0000 | ORAL_TABLET | Freq: Two times a day (BID) | ORAL | Status: DC
  Administered 2017-12-01 – 2017-12-02 (×2): 1 via ORAL
  Filled 2017-12-01 (×2): qty 1

## 2017-12-01 MED ORDER — PROMETHAZINE HCL 25 MG PO TABS: 25.0000 mg | ORAL_TABLET | Freq: Four times a day (QID) | ORAL | Status: DC | PRN

## 2017-12-01 MED ORDER — ACETAMINOPHEN 325 MG PO TABS
ORAL_TABLET | ORAL | Status: AC
  Filled 2017-12-01: qty 2

## 2017-12-01 MED ORDER — LINACLOTIDE 145 MCG PO CAPS
145.0000 ug | ORAL_CAPSULE | Freq: Every day | ORAL | Status: DC
  Filled 2017-12-01: qty 1

## 2017-12-01 MED ORDER — NITROGLYCERIN 0.4 MG SL SUBL: 0.4000 mg | SUBLINGUAL_TABLET | SUBLINGUAL | Status: DC | PRN

## 2017-12-01 MED ORDER — SODIUM CHLORIDE 0.9% FLUSH: 3.0000 mL | INTRAVENOUS | Status: DC | PRN

## 2017-12-01 MED ORDER — HEPARIN (PORCINE) IN NACL 1000-0.9 UT/500ML-% IV SOLN
INTRAVENOUS | Status: AC
  Filled 2017-12-01: qty 500

## 2017-12-01 MED ORDER — SODIUM CHLORIDE 0.9 % IV SOLN
INTRAVENOUS | Status: DC
  Administered 2017-12-01: 09:00:00 via INTRAVENOUS

## 2017-12-01 MED ORDER — SODIUM CHLORIDE 0.9 % IV SOLN: 250.0000 mL | INTRAVENOUS | Status: DC | PRN

## 2017-12-01 MED ORDER — GABAPENTIN 100 MG PO CAPS: 200.0000 mg | ORAL_CAPSULE | Freq: Every evening | ORAL | Status: DC | PRN

## 2017-12-01 MED ORDER — FLECAINIDE ACETATE 100 MG PO TABS
100.0000 mg | ORAL_TABLET | Freq: Two times a day (BID) | ORAL | Status: DC
  Administered 2017-12-01 – 2017-12-02 (×2): 100 mg via ORAL
  Filled 2017-12-01 (×2): qty 1

## 2017-12-01 SURGICAL SUPPLY — 12 items
BAG SNAP BAND KOVER 36X36 (MISCELLANEOUS) ×2 IMPLANT
CATH JOSEPHSON QUAD-ALLRED 6FR (CATHETERS) ×2 IMPLANT
CATH NAVISTAR SMARTTOUCH DF (ABLATOR) ×2 IMPLANT
CATH POLARIS X 2.5/5/2.5 DECAP (CATHETERS) ×2 IMPLANT
PACK EP LATEX FREE (CUSTOM PROCEDURE TRAY) ×1
PACK EP LF (CUSTOM PROCEDURE TRAY) ×1 IMPLANT
PAD DEFIB LIFELINK (PAD) ×2 IMPLANT
PATCH CARTO3 (PAD) ×2 IMPLANT
SHEATH PINNACLE 6F 10CM (SHEATH) ×2 IMPLANT
SHEATH PINNACLE 8F 10CM (SHEATH) ×6 IMPLANT
SHIELD RADPAD SCOOP 12X17 (MISCELLANEOUS) ×2 IMPLANT
TUBING SMART ABLATE COOLFLOW (TUBING) ×2 IMPLANT

## 2017-12-01 NOTE — Progress Notes (Addendum)
Site area: Right groin a 8 french arterial and 6, and 8 french X2 venous sheaths were removed  Site Prior to Removal:  Level 0  Pressure Applied For 30 MINUTES    Bedrest Beginning at 1715p  Manual:   Yes.    Patient Status During Pull:  stable  Post Pull Groin Site:  Level 0  Post Pull Instructions Given:  Yes.    Post Pull Pulses Present:  Yes.    Dressing Applied:  Yes.    Comments:  VS remain stable

## 2017-12-01 NOTE — Interval H&P Note (Signed)
History and Physical Interval Note:  12/01/2017 11:42 AM  [name]  has presented today for surgery, with the diagnosis of PVCs  The various methods of treatment have been discussed with the patient and family. After consideration of risks, benefits and other options for treatment, the patient has consented to  Procedure(s): PVC ABLATION (N/A) as a surgical intervention .  The patient's history has been reviewed, patient examined, no change in status, stable for surgery.  I have reviewed the patient's chart and labs.  Questions were answered to the patient's satisfaction.     Cristopher Peru

## 2017-12-02 ENCOUNTER — Inpatient Hospital Stay (HOSPITAL_COMMUNITY): Admission: RE | Admit: 2017-12-02 | Payer: Medicare Other | Source: Ambulatory Visit

## 2017-12-02 ENCOUNTER — Encounter (HOSPITAL_COMMUNITY): Payer: Self-pay | Admitting: Internal Medicine

## 2017-12-02 DIAGNOSIS — G4733 Obstructive sleep apnea (adult) (pediatric): Secondary | ICD-10-CM | POA: Diagnosis not present

## 2017-12-02 DIAGNOSIS — I493 Ventricular premature depolarization: Secondary | ICD-10-CM

## 2017-12-02 DIAGNOSIS — I5042 Chronic combined systolic (congestive) and diastolic (congestive) heart failure: Secondary | ICD-10-CM | POA: Diagnosis not present

## 2017-12-02 DIAGNOSIS — E119 Type 2 diabetes mellitus without complications: Secondary | ICD-10-CM | POA: Diagnosis not present

## 2017-12-02 DIAGNOSIS — I428 Other cardiomyopathies: Secondary | ICD-10-CM | POA: Diagnosis not present

## 2017-12-02 DIAGNOSIS — K589 Irritable bowel syndrome without diarrhea: Secondary | ICD-10-CM | POA: Diagnosis not present

## 2017-12-02 DIAGNOSIS — F419 Anxiety disorder, unspecified: Secondary | ICD-10-CM | POA: Diagnosis not present

## 2017-12-02 DIAGNOSIS — Z882 Allergy status to sulfonamides status: Secondary | ICD-10-CM | POA: Diagnosis not present

## 2017-12-02 DIAGNOSIS — I110 Hypertensive heart disease with heart failure: Secondary | ICD-10-CM | POA: Diagnosis not present

## 2017-12-02 DIAGNOSIS — E669 Obesity, unspecified: Secondary | ICD-10-CM | POA: Diagnosis not present

## 2017-12-02 DIAGNOSIS — F329 Major depressive disorder, single episode, unspecified: Secondary | ICD-10-CM | POA: Diagnosis not present

## 2017-12-02 DIAGNOSIS — Z6834 Body mass index (BMI) 34.0-34.9, adult: Secondary | ICD-10-CM | POA: Diagnosis not present

## 2017-12-02 LAB — GLUCOSE, CAPILLARY
Glucose-Capillary: 92 mg/dL (ref 70–99)
Glucose-Capillary: 96 mg/dL (ref 70–99)

## 2017-12-02 MED ORDER — FLECAINIDE ACETATE 100 MG PO TABS: 100.0000 mg | ORAL_TABLET | Freq: Two times a day (BID) | ORAL | 1 refills | Status: DC

## 2017-12-02 NOTE — Progress Notes (Signed)
Pt's bedrest status was completed at 1062 with no complication noted, denies any major pain on her right groin, just soreness, dressing dry and intact, tab tylenol 650mg  given at 2354, pt was assisted to the bathroom, cleaned up and was made comfortable in bed, was however reassured and will continue to monitor. Obasogie-Asidi, Gurveer Colucci Efe

## 2017-12-02 NOTE — Discharge Instructions (Signed)
No driving for 4 days. No lifting over 5 lbs for 1 week. No sexual activity for 1 week. You may return to work in 1 week. Keep procedure site clean & dry. If you notice increased pain, swelling, bleeding or pus, call/return!  You may shower, but no soaking baths/hot tubs/pools for 1 week.   Flecainide tablets What is this medicine? FLECAINIDE (FLEK a nide) is an antiarrhythmic drug. This medicine is used to prevent irregular heart rhythm. It can also slow down fast heartbeats called tachycardia. This medicine may be used for other purposes; ask your health care provider or pharmacist if you have questions. COMMON BRAND NAME(S): Tambocor What should I tell my health care provider before I take this medicine? They need to know if you have any of these conditions: -abnormal levels of potassium in the blood -heart disease including heart rhythm and heart rate problems -kidney or liver disease -recent heart attack -an unusual or allergic reaction to flecainide, local anesthetics, other medicines, foods, dyes, or preservatives -pregnant or trying to get pregnant -breast-feeding How should I use this medicine? Take this medicine by mouth with a glass of water. Follow the directions on the prescription label. You can take this medicine with or without food. Take your doses at regular intervals. Do not take your medicine more often than directed. Do not stop taking this medicine suddenly. This may cause serious, heart-related side effects. If your doctor wants you to stop the medicine, the dose may be slowly lowered over time to avoid any side effects. Talk to your pediatrician regarding the use of this medicine in children. While this drug may be prescribed for children as young as 1 year of age for selected conditions, precautions do apply. Overdosage: If you think you have taken too much of this medicine contact a poison control center or emergency room at once. NOTE: This medicine is only for you. Do  not share this medicine with others. What if I miss a dose? If you miss a dose, take it as soon as you can. If it is almost time for your next dose, take only that dose. Do not take double or extra doses. What may interact with this medicine? Do not take this medicine with any of the following medications: -amoxapine -arsenic trioxide -certain antibiotics like clarithromycin, erythromycin, gatifloxacin, gemifloxacin, levofloxacin, moxifloxacin, sparfloxacin, or troleandomycin -certain antidepressants called tricyclic antidepressants like amitriptyline, imipramine, or nortriptyline -certain medicines to control heart rhythm like disopyramide, dofetilide, encainide, moricizine, procainamide, propafenone, and quinidine -cisapride -cyclobenzaprine -delavirdine -droperidol -haloperidol -hawthorn -imatinib -levomethadyl -maprotiline -medicines for malaria like chloroquine and halofantrine -pentamidine -phenothiazines like chlorpromazine, mesoridazine, prochlorperazine, thioridazine -pimozide -quinine -ranolazine -ritonavir -sertindole -ziprasidone This medicine may also interact with the following medications: -cimetidine -medicines for angina or high blood pressure -medicines to control heart rhythm like amiodarone and digoxin This list may not describe all possible interactions. Give your health care provider a list of all the medicines, herbs, non-prescription drugs, or dietary supplements you use. Also tell them if you smoke, drink alcohol, or use illegal drugs. Some items may interact with your medicine. What should I watch for while using this medicine? Visit your doctor or health care professional for regular checks on your progress. Because your condition and the use of this medicine carries some risk, it is a good idea to carry an identification card, necklace or bracelet with details of your condition, medications and doctor or health care professional. Check your blood  pressure and pulse rate regularly. Ask your  health care professional what your blood pressure and pulse rate should be, and when you should contact him or her. Your doctor or health care professional also may schedule regular blood tests and electrocardiograms to check your progress. You may get drowsy or dizzy. Do not drive, use machinery, or do anything that needs mental alertness until you know how this medicine affects you. Do not stand or sit up quickly, especially if you are an older patient. This reduces the risk of dizzy or fainting spells. Alcohol can make you more dizzy, increase flushing and rapid heartbeats. Avoid alcoholic drinks. What side effects may I notice from receiving this medicine? Side effects that you should report to your doctor or health care professional as soon as possible: -chest pain, continued irregular heartbeats -difficulty breathing -swelling of the legs or feet -trembling, shaking -unusually weak or tired Side effects that usually do not require medical attention (report to your doctor or health care professional if they continue or are bothersome): -blurred vision -constipation -headache -nausea, vomiting -stomach pain This list may not describe all possible side effects. Call your doctor for medical advice about side effects. You may report side effects to FDA at 1-800-FDA-1088. Where should I keep my medicine? Keep out of the reach of children. Store at room temperature between 15 and 30 degrees C (59 and 86 degrees F). Protect from light. Keep container tightly closed. Throw away any unused medicine after the expiration date. NOTE: This sheet is a summary. It may not cover all possible information. If you have questions about this medicine, talk to your doctor, pharmacist, or health care provider.  2018 Elsevier/Gold Standard (2007-09-14 16:46:09)

## 2017-12-02 NOTE — Discharge Summary (Addendum)
ELECTROPHYSIOLOGY PROCEDURE DISCHARGE SUMMARY    Patient ID: [name],  MRN: [id number], DOB/AGE: Jun 12, 1975 42 y.o.  Admit date: 12/01/2017 Discharge date: 12/02/2017  Primary Care Physician: Hoyt Koch, MD Primary Cardiologist: Tamala Julian Heart Failure: [place] Electrophysiologist: Lovena Le  Primary Discharge Diagnosis:  1.  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  HTN 2.  Obesity 3.  Diabetes 4.  IBS 5.  Depression/anxiety 6.  OSA on CPAP 7.  Chronic combined systolic and diastolic heart failure  Allergies  Allergen Reactions  . Aspirin Anaphylaxis and Other (See Comments)    Wheezing, "but patient still takes if she needs to"  . Vancomycin Other (See Comments)    "did something to my kidneys," PROGRESSED TO KIDNEY FAILURE!!  . Contrast Media [Iodinated Diagnostic Agents] Other (See Comments)    Multiple CT contrast studies done over 2 weeks caused ARF  . Avocado Itching and Swelling  . Ciprofloxacin Itching and Rash  . Sulfa Antibiotics Itching and Rash    Procedures This Admission:  1.  Electrophysiology study and radiofrequency catheter ablation on 12/01/17 by Dr Lovena Le. This study demonstrated PVCs originating in the junction between the RV outflow tract, the LV outflow tract, and the great cardiac vein which could be suppressed with RF energy application but not cured despite an aggressive ablation protocol guided by three-dimensional electro-anatomic mapping.  There were no early apparent complications.   Brief HPI:  [name] is a 42 y.o. female with the above past medical history. She has had persistent PVC's and was referred to EP for treatment options. Risks, benefits to EPS/ablation were reviewed with the patient who wished to proceed.  Hospital Course:  The patient was admitted and underwent EPS/ablation with details as outlined above. She was started on Flecainide post ablation with some improvement in  PVC burden. Her groin was without complication.  She was seen by Dr Lovena Le and considered stable for discharge to home. She will need an EKG in 1 week post Flecainide start.   Physical Exam: Vitals:   12/01/17 2116 12/02/17 0115 12/02/17 0319 12/02/17 0614  BP: 114/82 (!) 104/52  110/70  Pulse: 80 81 79 89  Resp: 12 19 (!) 0 14  Temp: 98.6 F (37 C)   98.3 F (36.8 C)  TempSrc: Axillary   Oral  SpO2: 100% 94% 94% 97%  Weight:    204 lb 14.4 oz (92.9 kg)  Height:        GEN- The patient is well appearing, alert and oriented x 3 today.   HEENT: normocephalic, atraumatic; sclera clear, conjunctiva pink; hearing intact; oropharynx clear; neck supple  Lungs- Clear to ausculation bilaterally, normal work of breathing.  No wheezes, rales, rhonchi Heart- Regular rate and rhythm, +ectopy GI- soft, non-tender, non-distended, bowel sounds present  Extremities- no clubbing, cyanosis, or edema  MS- no significant deformity or atrophy Skin- warm and dry, no rash or lesion Psych- euthymic mood, full affect Neuro- strength and sensation are intact    Labs:   Lab Results  Component Value Date   WBC 6.5 11/22/2017   HGB 11.8 (L) 11/22/2017   HCT 37.0 11/22/2017   MCV 91.6 11/22/2017   PLT 194 11/22/2017   No results for input(s): NA, K, CL, CO2, BUN, CREATININE, CALCIUM, PROT, BILITOT, ALKPHOS, ALT, AST, GLUCOSE in the last 168 hours.  Invalid input(s): LABALBU   Discharge Medications:  Allergies as of 12/02/2017      Reactions  Aspirin Anaphylaxis, Other (See Comments)   Wheezing, "but patient still takes if she needs to"   Vancomycin Other (See Comments)   "did something to my kidneys," PROGRESSED TO KIDNEY FAILURE!!   Contrast Media [iodinated Diagnostic Agents] Other (See Comments)   Multiple CT contrast studies done over 2 weeks caused ARF   Avocado Itching, Swelling   Ciprofloxacin Itching, Rash   Sulfa Antibiotics Itching, Rash      Medication List    TAKE these  medications   albuterol 108 (90 Base) MCG/ACT inhaler Commonly known as:  PROVENTIL HFA;VENTOLIN HFA Inhale 1-2 puffs into the lungs every 6 (six) hours as needed for wheezing or shortness of breath. **PT NEEDS TO ESTABLISH WITH ANOTHER PROVIDER FOR ADDITIONAL REFILLS** What changed:  additional instructions   blood glucose meter kit and supplies Dispense based on patient and insurance preference. Use up to two times daily as directed. (FOR ICD-10 E10.9, E11.9).   carvedilol 12.5 MG tablet Commonly known as:  COREG Take 1 tablet (12.5 mg total) by mouth 2 (two) times daily with a meal.   CORLANOR 7.5 MG Tabs tablet Generic drug:  ivabradine TAKE 1 TABLET (7.5 MG TOTAL) BY MOUTH 2 (TWO) TIMES DAILY WITH A MEAL.   dicyclomine 20 MG tablet Commonly known as:  BENTYL Take 20 mg by mouth 2 (two) times daily as needed for spasms.   flecainide 100 MG tablet Commonly known as:  TAMBOCOR Take 1 tablet (100 mg total) by mouth every 12 (twelve) hours.   gabapentin 100 MG capsule Commonly known as:  NEURONTIN Take 200 mg by mouth at bedtime as needed (for pain).   linaclotide 145 MCG Caps capsule Commonly known as:  LINZESS Take 1 capsule (145 mcg total) by mouth daily before breakfast.   MULTIVITAMIN GUMMIES WOMENS PO Take 2 each by mouth daily.   nitroGLYCERIN 0.4 MG SL tablet Commonly known as:  NITROSTAT PLACE 1 TABLET (0.4 MG TOTAL) UNDER THE TONGUE EVERY 5 (FIVE) MINUTES AS NEEDED FOR CHEST PAIN.   omeprazole 20 MG capsule Commonly known as:  PRILOSEC Take 20 mg by mouth daily.   potassium chloride SA 20 MEQ tablet Commonly known as:  K-DUR,KLOR-CON Take 1 tablet (20 mEq total) by mouth daily. Take extra tablet on days you take Torsemide. What changed:    how much to take  when to take this  additional instructions   promethazine 25 MG tablet Commonly known as:  PHENERGAN Take 25 mg by mouth every 6 (six) hours as needed for nausea or vomiting.   RISEDRONATE  SODIUM PO Take 1 capsule by mouth every 28 (twenty-eight) days.   sacubitril-valsartan 49-51 MG Commonly known as:  ENTRESTO Take 1 tablet by mouth 2 (two) times daily.   spironolactone 25 MG tablet Commonly known as:  ALDACTONE Take 25 mg by mouth daily.   torsemide 20 MG tablet Commonly known as:  DEMADEX Take 2 tablets (40 mg total) by mouth daily.   Vitamin D (Ergocalciferol) 50000 units Caps capsule Commonly known as:  DRISDOL TAKE 1 CAPSULE (50,000 UNITS TOTAL) BY MOUTH EVERY 7 (SEVEN) DAYS. What changed:  when to take this   zolpidem 5 MG tablet Commonly known as:  AMBIEN Take 1 tablet (5 mg total) by mouth at bedtime as needed for sleep.       Disposition: Pt is being discharged home today in good condition.  Follow-up Information    [facility] Follow up on 12/10/2017.   Specialty:  Cardiology Why:  at [facility] for EKG Contact information: [address] [place]       Evans Lance, MD Follow up on 01/07/2018.   Specialty:  Cardiology Why:  at 12:30PM  Contact information: [address] [address] [phone]           Duration of Discharge Encounter: Greater than 30 minutes including physician time.  Signed, Chanetta Marshall, NP 12/02/2017 9:22 AM  EP Attending  Patient seen and examined. Agree with above. She is a bit disappointed about not having her PVC's cured. She appears to be tolerating her flecainide. With her EF improved, and an ICD in place, flecainide is reasonable.   Mikle Bosworth.D.

## 2017-12-06 ENCOUNTER — Ambulatory Visit (INDEPENDENT_AMBULATORY_CARE_PROVIDER_SITE_OTHER): Payer: Medicare Other | Admitting: *Deleted

## 2017-12-06 ENCOUNTER — Ambulatory Visit (INDEPENDENT_AMBULATORY_CARE_PROVIDER_SITE_OTHER): Payer: Medicare Other

## 2017-12-06 DIAGNOSIS — O903 Peripartum cardiomyopathy: Secondary | ICD-10-CM

## 2017-12-06 DIAGNOSIS — I5042 Chronic combined systolic (congestive) and diastolic (congestive) heart failure: Secondary | ICD-10-CM

## 2017-12-06 DIAGNOSIS — Z95810 Presence of automatic (implantable) cardiac defibrillator: Secondary | ICD-10-CM | POA: Diagnosis not present

## 2017-12-06 NOTE — Progress Notes (Signed)
Remote ICD transmission.   

## 2017-12-06 NOTE — Progress Notes (Signed)
EPIC Encounter for ICM Monitoring  Patient Name: [name] is a 42 y.o. female Date: 12/06/2017 Primary Care Physican: Hoyt Koch, MD Primary Cardiologist: Smith/Bensimhon Electrophysiologist: Faustino Congress Weight:207lbs         Heart Failure questions reviewed, pt asymptomatic for fluid symptoms.  Breathing is baseline, no swelling but does have slight weight gain since hospital discharge.  Patient has ablation for PVC on 7/11 but she said she still feels the same and the procedure did not help very much.    12/04/2017 [facility] HF Index: 45 which is above threshold suggesting posbbile fluid accumulation.  Prescribed dosage: Torsemide20 mgTake 2 tablets (40 mg total) by mouth daily.  Labs: 11/04/2017 Creatinine0.82, BUN6,   Potassium3.4, Sodium141, EGFR>60 10/07/2017 Creatinine1.05, BUN18, Potassium3.4, CLEXNT700, EGFR>60  08/05/2017 Creatinine1.10, BUN17, Potassium3.5, Sodium135, EGFR>60  07/21/2017 Creatinine0.9, BUN 16, Potassium4.1, Sodium137  06/11/2017 Creatinine1.11, BUN23, Potassium4.1, Sodium136 A complete set of results can be found in Results Review.  Recommendations: No changes.  Encouraged to call for fluid symptoms or to use ER if needed.  Follow-up plan: ICM clinic phone appointment on 12/21/2017.  Office appointment scheduled 01/07/2018 with Dr. Lovena Le.        Copy of ICM check sent to Dr. Caryl Comes and Dr. Haroldine Laws for review and if any recommendations will call hr back.   3 Month Trend   1 month Trend              [name], RN 12/06/2017 4:29 PM

## 2017-12-07 LAB — CUP PACEART REMOTE DEVICE CHECK
Battery Remaining Longevity: 144 mo
Battery Remaining Percentage: 100 %
Brady Statistic RV Percent Paced: 0 %
Date Time Interrogation Session: 20190713033900
HighPow Impedance: 68 Ohm
Implantable Lead Implant Date: 20180312
Implantable Lead Location: 753860
Implantable Lead Model: 293
Implantable Lead Serial Number: 422842
Implantable Pulse Generator Implant Date: 20180312
Lead Channel Impedance Value: 749 Ohm
Lead Channel Setting Pacing Amplitude: 2.5 V
Lead Channel Setting Pacing Pulse Width: 0.4 ms
Lead Channel Setting Sensing Sensitivity: 0.5 mV
Pulse Gen Serial Number: 226601

## 2017-12-07 NOTE — Progress Notes (Signed)
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 Will this affect the reading?   Let's double torsemide for 2 days and repeat check to see if it changes. Thanks

## 2017-12-07 NOTE — Progress Notes (Addendum)
Call to patient.  Advised Dr. Haroldine Laws recommended to increase Torsemide 20mg  to 2 tablets (40 mg total) twice a day x 2 days.  Patient verbalized understanding.  Next ICM remote transmission scheduled 12/10/2017 to recheck fluid levels.  Reviewed gastric bypass diet.  Fluid intake is between 50-65 oz daily which is mostly water, food intake is less but not higher in sodium.  She eats more fruit now than before she had surgery.

## 2017-12-08 ENCOUNTER — Encounter: Payer: Self-pay | Admitting: Cardiology

## 2017-12-09 ENCOUNTER — Inpatient Hospital Stay: Payer: BLUE CROSS/BLUE SHIELD | Admitting: Internal Medicine

## 2017-12-10 ENCOUNTER — Ambulatory Visit (INDEPENDENT_AMBULATORY_CARE_PROVIDER_SITE_OTHER): Payer: BLUE CROSS/BLUE SHIELD

## 2017-12-10 ENCOUNTER — Ambulatory Visit (INDEPENDENT_AMBULATORY_CARE_PROVIDER_SITE_OTHER): Payer: Medicare Other | Admitting: *Deleted

## 2017-12-10 VITALS — HR 67 | Ht 64.0 in | Wt 202.8 lb

## 2017-12-10 DIAGNOSIS — Z8679 Personal history of other diseases of the circulatory system: Secondary | ICD-10-CM | POA: Diagnosis not present

## 2017-12-10 DIAGNOSIS — I5042 Chronic combined systolic (congestive) and diastolic (congestive) heart failure: Secondary | ICD-10-CM

## 2017-12-10 DIAGNOSIS — Z9889 Other specified postprocedural states: Secondary | ICD-10-CM | POA: Diagnosis not present

## 2017-12-10 DIAGNOSIS — Z95810 Presence of automatic (implantable) cardiac defibrillator: Secondary | ICD-10-CM

## 2017-12-10 NOTE — Progress Notes (Signed)
Pt here for f/u EKG post flecainide start .Adonis Housekeeper

## 2017-12-10 NOTE — Progress Notes (Signed)
EPIC Encounter for ICM Monitoring  Patient Name: [name] is a 42 y.o. female Date: 12/10/2017 Primary Care Physican: Hoyt Koch, MD Primary Cardiologist: Smith/Bensimhon Electrophysiologist: Faustino Congress Weight:199lbs                                                  Heart Failure questions reviewed, pt asymptomatic.  Weight decreased from 207 lbs to 199 lbs.  She had cut back on fluid intake but advised to keep fluid intake around 64 oz especially since she took extra Furosemide x 2 days.  She denied any dizziness, lightheadness or feeling faint which could be signs of dehydration.    12/10/2017 Heartlogic HF Index: 18 after doubling Furosemide dosage as ordered by Dr Haroldine Laws.  Heartlogic Index improving and decreased from 45 to 18 tending toward baseline threshold.  Prescribed dosage: Torsemide20 mgTake 2 tablets (40 mg total) by mouth daily.  Labs: 11/04/2017 Creatinine0.82, BUN6, Potassium3.4, Sodium141, EGFR>60 10/07/2017 Creatinine1.05, BUN18, Potassium3.4, PGFQMK103, EGFR>60  08/05/2017 Creatinine1.10, BUN17, Potassium3.5, Sodium135, EGFR>60  07/21/2017 Creatinine0.9, BUN 16, Potassium4.1, Sodium137  06/11/2017 Creatinine1.11, BUN23, Potassium4.1, Sodium136 A complete set of results can be found in Results Review.  Recommendations: No changes.  Encouraged to call for fluid symptoms.  Follow-up plan: ICM clinic phone appointment on 12/21/2017.          Copy of ICM check sent to Dr. Lovena Le and Dr. Haroldine Laws.     3 Month Trend              [name], RN 12/10/2017 7:38 AM

## 2017-12-12 ENCOUNTER — Other Ambulatory Visit: Payer: Self-pay | Admitting: Cardiology

## 2017-12-13 ENCOUNTER — Telehealth: Payer: Self-pay | Admitting: Internal Medicine

## 2017-12-13 NOTE — Telephone Encounter (Signed)
Spoke with patient about her right upper leg pain related to her site for ablation. She had the ablation on 7/10 and said that the pain started last night and it was an aching, 9/10 on the pain scale. The patient took tylenol to relieve the pain and had little success. The site is sore to touch and the pain radiates to the front and back of the knee. The site, per patient, is to red or swollen. No bruising.   Reviewed the patient with Dr. Lovena Le and he recommended that the patient either come in to the office to have the site assessed or that she could take motrin to help with the pain. Called the patient to discuss and she decided to try the pain control and would call back tomorrow if the pain worsens.

## 2017-12-13 NOTE — Telephone Encounter (Signed)
New Message       Patient had abrasion on 07/10. Patient is still having pain in her right upper thigh. Please advise.

## 2017-12-14 ENCOUNTER — Ambulatory Visit (HOSPITAL_BASED_OUTPATIENT_CLINIC_OR_DEPARTMENT_OTHER): Payer: Medicare Other | Attending: Acute Care | Admitting: Pulmonary Disease

## 2017-12-14 VITALS — Ht 65.0 in | Wt 199.9 lb

## 2017-12-14 DIAGNOSIS — R0683 Snoring: Secondary | ICD-10-CM

## 2017-12-14 DIAGNOSIS — G4733 Obstructive sleep apnea (adult) (pediatric): Secondary | ICD-10-CM | POA: Insufficient documentation

## 2017-12-20 ENCOUNTER — Ambulatory Visit (INDEPENDENT_AMBULATORY_CARE_PROVIDER_SITE_OTHER): Payer: Medicare Other | Admitting: Internal Medicine

## 2017-12-20 ENCOUNTER — Encounter: Payer: Self-pay | Admitting: Internal Medicine

## 2017-12-20 DIAGNOSIS — I428 Other cardiomyopathies: Secondary | ICD-10-CM | POA: Diagnosis not present

## 2017-12-20 DIAGNOSIS — I10 Essential (primary) hypertension: Secondary | ICD-10-CM | POA: Diagnosis not present

## 2017-12-20 DIAGNOSIS — M25511 Pain in right shoulder: Secondary | ICD-10-CM

## 2017-12-20 MED ORDER — LIDOCAINE 5 % EX PTCH: 1.0000 | MEDICATED_PATCH | CUTANEOUS | 0 refills | Status: DC

## 2017-12-20 NOTE — Patient Instructions (Signed)
We have sent in lidocaine patches to use on the shoulder. Try icing the leg for the pain.

## 2017-12-20 NOTE — Progress Notes (Signed)
   Subjective:    Patient ID: [name], female    DOB: 01-Mar-1976, 42 y.o.   MRN: [id number]  HPI The patient is a 42 YO female coming in for hospital follow up (in for palpitations and chest pain with frequent PVCs, then in again for ablation and starting flecainide when the ablation did not decrease PVC burden). She has gone to cardiology for EKG which was fine and will see the doctor in a couple of weeks. She is overall feeling less tired and less palpitations on the flecainide. Denies side effects. Still some soreness in the right groin from the catheter and is using tylenol which does not helping. No pulsation there. Also with chronic right shoulder pain and cannot take ibuprofen for it. Also having episodes when she has not eaten in some time or with larger meals where she gets sweaty, clammy, dizzy, has to lie down for 10 minutes or so and then it passes. It feels like she may black out. Since her gastric procedure she does not get hungry and sometimes goes awhile between eating.   PMH, [facility], social history reviewed and updated.   Review of Systems  Constitutional: Positive for appetite change. Negative for activity change, fatigue, fever and unexpected weight change.  HENT: Negative.   Eyes: Negative.   Respiratory: Negative for cough, chest tightness and shortness of breath.   Cardiovascular: Positive for palpitations. Negative for chest pain and leg swelling.  Gastrointestinal: Negative for abdominal distention, abdominal pain, constipation, diarrhea, nausea and vomiting.  Musculoskeletal: Negative.   Skin: Negative.   Neurological: Positive for dizziness.  Psychiatric/Behavioral: Negative.       Objective:   Physical Exam  Constitutional: She is oriented to person, place, and time. She appears well-developed and well-nourished.  HENT:  Head: Normocephalic and atraumatic.  Eyes: EOM are normal.  Neck: Normal range of motion.  Cardiovascular: Normal rate.  Pulmonary/Chest:  Effort normal and breath sounds normal. No respiratory distress. She has no wheezes. She has no rales.  Abdominal: Soft. Bowel sounds are normal. She exhibits no distension. There is no tenderness. There is no rebound.  Musculoskeletal: She exhibits no edema.  Neurological: She is alert and oriented to person, place, and time. Coordination normal.  Skin: Skin is warm and dry.  Psychiatric: She has a normal mood and affect.   Vitals:   12/20/17 1532  BP: 110/70  Pulse: 78  Temp: 97.8 F (36.6 C)  TempSrc: Oral  SpO2: 98%  Weight: 206 lb (93.4 kg)  Height: 5\' 5"  (1.651 m)      Assessment & Plan:

## 2017-12-21 ENCOUNTER — Telehealth: Payer: Self-pay

## 2017-12-21 ENCOUNTER — Telehealth: Payer: Self-pay | Admitting: Acute Care

## 2017-12-21 ENCOUNTER — Ambulatory Visit (INDEPENDENT_AMBULATORY_CARE_PROVIDER_SITE_OTHER): Payer: BLUE CROSS/BLUE SHIELD

## 2017-12-21 DIAGNOSIS — Z95810 Presence of automatic (implantable) cardiac defibrillator: Secondary | ICD-10-CM

## 2017-12-21 DIAGNOSIS — I5042 Chronic combined systolic (congestive) and diastolic (congestive) heart failure: Secondary | ICD-10-CM

## 2017-12-21 NOTE — Assessment & Plan Note (Signed)
Suspect she is getting relative hypotension with eating after fasting due to blood going to intestines. Asked her to eat more frequent smaller portions and not wait so long before eating. She could also be getting some low sugar levels. She does not have a meter to check the levels.

## 2017-12-21 NOTE — Procedures (Signed)
    Patient Name: [name], [name] Date: 12/14/2017 Gender: Female D.O.B: [birth year] Age (years): 52 Referring Provider: Magdalen Spatz NP Height (inches): 65 Interpreting Physician: Chesley Mires MD, ABSM Weight (lbs): 200 RPSGT: Laren Everts BMI: 33 MRN: [id number] Neck Size: 14.50  CLINICAL INFORMATION Sleep Study Type: NPSG  Indication for sleep study: Congestive Heart Failure, Fatigue, Hypertension, Obesity, OSA, Re-Evaluation, Snoring, Witnessed Apneas  Epworth Sleepiness Score: 11  SLEEP STUDY TECHNIQUE As per the AASM Manual for the Scoring of Sleep and Associated Events v2.3 (April 2016) with a hypopnea requiring 4% desaturations.  The channels recorded and monitored were frontal, central and occipital EEG, electrooculogram (EOG), submentalis EMG (chin), nasal and oral airflow, thoracic and abdominal wall motion, anterior tibialis EMG, snore microphone, electrocardiogram, and pulse oximetry.  MEDICATIONS Medications self-administered by patient taken the night of the study : N/A  SLEEP ARCHITECTURE The study was initiated at 11:04:48 PM and ended at 5:04:12 AM.  Sleep onset time was 43.7 minutes and the sleep efficiency was 79.7%%. The total sleep time was 286.5 minutes.  Stage REM latency was 71.0 minutes.  The patient spent 6.3%% of the night in stage N1 sleep, 74.0%% in stage N2 sleep, 0.0%% in stage N3 and 19.7% in REM.  Alpha intrusion was absent.  Supine sleep was 73.59%.  RESPIRATORY PARAMETERS The overall apnea/hypopnea index (AHI) was 0.8 per hour. There were 0 total apneas, including 0 obstructive, 0 central and 0 mixed apneas. There were 4 hypopneas and 6 RERAs.  The AHI during Stage REM sleep was 1.1 per hour.  AHI while supine was 1.1 per hour.  The mean oxygen saturation was 93.6%. The minimum SpO2 during sleep was 84.0%.  moderate snoring was noted during this study.  CARDIAC DATA The 2 lead EKG demonstrated sinus rhythm. The mean  heart rate was 65.8 beats per minute. Other EKG findings include: None.  LEG MOVEMENT DATA The total PLMS were 0 with a resulting PLMS index of 0.0. Associated arousal with leg movement index was 0.0 .  IMPRESSIONS - While she had a few obstructive respiratory events.  These were not frequent enough to qualify for diagnosis of obstructive sleep apnea.  Her AHI was 0.8 with an SpO2 low of 84%. She spent only 0.5 minutes of test time with an SpO2 < 88%.  Of note is that she has lost approximately 55 lbs since 2014, and this likely contributed to improvement in her previous history of obstructive sleep apnea. - Moderate snoring noted.  DIAGNOSIS - Snoring.  RECOMMENDATIONS - Avoid alcohol, sedatives and other CNS depressants that may worsen sleep apnea and disrupt normal sleep architecture. - Sleep hygiene should be reviewed to assess factors that may improve sleep quality. - Weight management and regular exercise should be initiated or continued if appropriate.  [Electronically signed] 12/21/2017 08:12 AM

## 2017-12-21 NOTE — Assessment & Plan Note (Signed)
Taking entresto, coreg, corlanor, flecainide, spironolactone, torsemide.

## 2017-12-21 NOTE — Assessment & Plan Note (Signed)
Rx for lidoderm patches for the pain. She is not getting relief from pensaid or otc medications.

## 2017-12-21 NOTE — Telephone Encounter (Signed)
Please call patient and let her know she does not qualify for CPAP as her sleep study does not show significant sleep apnea. Dr. Halford Chessman feels this change may be related to her weight loss of 55 pounds since the previous test was done. See recommendations below.  Thanks so much    Results - While she had a few obstructive respiratory events.  These were not frequent enough to qualify for diagnosis of obstructive sleep apnea.  Her AHI was 0.8 with an SpO2 low of 84%. She spent only 0.5 minutes of test time with an SpO2 < 88%.  Of note is that she has lost approximately 55 lbs since 2014, and this likely contributed to improvement in her previous history of obstructive sleep apnea. - Moderate snoring noted.  DIAGNOSIS - Snoring.  RECOMMENDATIONS - Avoid alcohol, sedatives and other CNS depressants that may worsen sleep apnea and disrupt normal sleep architecture. - Sleep hygiene should be reviewed to assess factors that may improve sleep quality. - Weight management and regular exercise should be initiated or continued if appropriate.

## 2017-12-21 NOTE — Telephone Encounter (Signed)
Spoke with pt and reminded pt of remote transmission that is due today. Pt verbalized understanding.   

## 2017-12-22 NOTE — Telephone Encounter (Signed)
ATC pt, no answer. Left message for pt to call back.  

## 2017-12-22 NOTE — Telephone Encounter (Signed)
Attempted to call patient today regarding results. I did not receive an answer at time of call. I have left a voicemail message for pt to return call. X1  

## 2017-12-22 NOTE — Telephone Encounter (Signed)
Patient returned phone call; ok to leave a message at: [phone]

## 2017-12-24 NOTE — Progress Notes (Signed)
EPIC Encounter for ICM Monitoring  Patient Name: [name] is a 42 y.o. female Date: 12/24/2017 Primary Care Physican: Hoyt Koch, MD Primary Cardiologist: Smith/Bensimhon Electrophysiologist: Caryl Comes Dry Weight:201lbs        Heart Failure questions reviewed, pt asymptomatic.   HeartLogic Index 2: Returned normal and below threshold of 6.   Prescribed dosage: Torsemide20 mgTake 2 tablets (40 mg total) by mouth daily.  Labs: 11/24/2017 Creatinine 1.02, BUN 12, Potassium 3.9, Sodium 137, EGFR >60 11/23/2017 Creatinine 1.08, BUN 11, Potassium 3.5, Sodium 138, EGFR >60  11/22/2017 Creatinine 0.84, BUN 8,   Potassium 3.3, Sodium 140, EGFR >60  11/21/2017 Creatinine 0.81, BUN 9,   Potassium 3.7, Sodium 140, EGFR >60  11/04/2017 Creatinine0.82, BUN6, Potassium3.4, Sodium141, EGFR>60 10/07/2017 Creatinine1.05, BUN18, Potassium3.4, GYBWLS937, EGFR>60  08/05/2017 Creatinine1.10, BUN17, Potassium3.5, Sodium135, EGFR>60  07/21/2017 Creatinine0.9, BUN 16, Potassium4.1, Sodium137  06/11/2017 Creatinine1.11, BUN23, Potassium4.1, Sodium136 A complete set of results can be found in Results Review.  Recommendations: No changes.  She is drinking approximately 70 ounces daily.  Encouraged to call for fluid symptoms.  Follow-up plan: ICM clinic phone appointment on 02/07/2018.   Office appointment scheduled 01/07/2018 with Dr. Lovena Le for right leg pain post ablation.    Copy of ICM check sent to Dr. Caryl Comes.     3 month ICM trend: 12/24/2017        [name], RN 12/24/2017 8:00 AM

## 2017-12-24 NOTE — Telephone Encounter (Signed)
Advised pt of results. Pt understood and nothing further is needed.   

## 2017-12-29 NOTE — Progress Notes (Signed)
[facility] PRE[facility] Weekly Session   Patient Details  Name: [name] MRN: [id number] Date of Birth: [birth year] Age: 42 y.o. PCP: Hoyt Koch, MD  Vitals:   12/27/17 1426  Weight: 202 lb 12.8 oz (92 kg)    [facility] Weekly seesion - 12/29/17 1400      Weekly Session   Topic Discussed  Other ways to be active    Minutes exercised this week  45 minutes 30cardio/15strength   30cardio/15strength     Fun things you did since last meeting:"watched TV w/daughter" Things you are grateful for:"life" Nutrition celebrations:"found my favorite cherries on sale" Barriers:"Getting in 3 solid meals daily"   [name] 12/29/2017, 2:31 PM

## 2018-01-03 ENCOUNTER — Encounter: Payer: Self-pay | Admitting: Family Medicine

## 2018-01-03 ENCOUNTER — Encounter

## 2018-01-03 ENCOUNTER — Ambulatory Visit (INDEPENDENT_AMBULATORY_CARE_PROVIDER_SITE_OTHER): Payer: Medicare Other | Admitting: Family Medicine

## 2018-01-03 VITALS — BP 110/78 | HR 72 | Wt 213.0 lb

## 2018-01-03 DIAGNOSIS — M25511 Pain in right shoulder: Secondary | ICD-10-CM | POA: Diagnosis not present

## 2018-01-03 DIAGNOSIS — M999 Biomechanical lesion, unspecified: Secondary | ICD-10-CM | POA: Diagnosis not present

## 2018-01-03 MED ORDER — DICLOFENAC SODIUM 2 % TD SOLN: 2.0000 g | Freq: Two times a day (BID) | TRANSDERMAL | 3 refills | Status: DC

## 2018-01-03 NOTE — Progress Notes (Signed)
[name] [place],  [address] Phone: [phone] Subjective:     CC: Right shoulder pain follow-up  KDT:[id number]  [name] is a 42 y.o. female coming in with complaint of right shoulder and scapula pain. She has had pain for years. Pain increased over past 3 weeks. Patient notes having surgery recently so she has been on bed rest. Constant, sharp pain. Does affect sleeping. Takes Tylenol PM and using ice and/or heat. Tried lidocaine patches and pennsaid. Has been treated with OMT over 1 year ago with relief.  Patient did have x-rays taken and did show the patient did have a spur of the right shoulder previously with some calcific tendinitis.  Feels like it is not as much the shoulder.  Seems to be more secondary to the back.  Has responded well to the manipulation previously    Past Medical History:  Diagnosis Date  . Acute on chronic systolic (congestive) heart failure ([place]) 04/29/2017  . Acute pain of right shoulder 06/16/2017  . AICD (automatic cardioverter/defibrillator) present   . Anxiety   . Arthritis    right shoulder   . Asthma   . CHF (congestive heart failure) ([place])   . Chronic combined systolic and diastolic heart failure ([place]) 03/05/2014  . Closed low lateral malleolus fracture 10/23/2013  . Cystitis 10/21/2017  . Depression   . Depression with anxiety 01/20/2013  . Diabetes mellitus without complication ([place])   . Diverticulosis   . Dyspnea    comes and goes intermittently mostly with exertion   . Essential hypertension    Prev followed by [facility]/ Cardiology   . Fibroid    age 89  . Gallstones   . Generalized abdominal cramping   . History of cardiomyopathy   . Hypertension   . IBS (irritable bowel syndrome)   . 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 Insomnia 05/07/2017  . Labral tear of shoulder 04/04/2015    Injected 04/04/2015 Injected 12/03/2015   . Migraine    "monthly" (08/03/2016)  . Myofascial pain 06/16/2017  . NICM (nonischemic cardiomyopathy) ([place]) 08/03/2016  . Nonallopathic lesion of lumbosacral region 11/16/2016  . Nonallopathic lesion of sacral region 11/16/2016  . Nonallopathic lesion of thoracic region 08/20/2014  . Nonspecific chest pain 04/28/2017  . OSA (obstructive sleep apnea) 01/02/2013   NPSG 2009:  AHI 9/hr. CPAP intolerance >> "smothering" Good tolerance of auto device (optimal pressure 12-13 on download).  - referred to Dr Gwenette Greet    . OSA on CPAP   . Ovarian cyst    1999; surgically removed  . Patellofemoral syndrome of both knees 10/16/2016  . 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 PVC (premature ventricular contraction) 06/23/2016  . Seizures ([place])    "as a child" (08/03/2016)  . Termination of pregnancy    due to cardiac risk   Past Surgical History:  Procedure Laterality Date  . CARDIAC CATHETERIZATION N/A 11/11/2015   Procedure: Right/Left Heart Cath and Coronary Angiography;  Surgeon: Troy Sine, MD;  Location: [place];  Service: Cardiovascular;  Laterality: N/A;  . CARDIAC CATHETERIZATION  ~ 2015  . CARDIAC DEFIBRILLATOR PLACEMENT  08/03/2016  . CESAREAN SECTION  1999  . COLONOSCOPY WITH PROPOFOL N/A 04/21/2016   Procedure: COLONOSCOPY WITH PROPOFOL;  Surgeon: Jerene Bears, MD;  Location: [facility];  Service: Gastroenterology;  Laterality: N/A;  . ESOPHAGOGASTRODUODENOSCOPY (EGD) WITH PROPOFOL N/A 04/21/2016  Procedure: ESOPHAGOGASTRODUODENOSCOPY (EGD) WITH PROPOFOL;  Surgeon: Jerene Bears, MD;  Location: [facility];  Service: Gastroenterology;  Laterality: N/A;  . FOOT FRACTURE SURGERY Right ~ 2003  . FRACTURE SURGERY    . ICD IMPLANT N/A 08/03/2016   Procedure: ICD Implant;  Surgeon: Deboraha Sprang, MD;  Location: [facility];  Service: Cardiovascular;  Laterality: N/A;  . LAPAROSCOPIC CHOLECYSTECTOMY  12/2006  . LAPAROSCOPIC GASTRIC SLEEVE RESECTION    . LAPAROSCOPY  ABDOMEN DIAGNOSTIC  2008   "cut bile duct w/gallbladder OR; had to go in later & fix leak; hospitalized for 2 months"  . LEFT HEART CATHETERIZATION WITH CORONARY ANGIOGRAM N/A 02/26/2014   Procedure: LEFT HEART CATHETERIZATION WITH CORONARY ANGIOGRAM;  Surgeon: Jettie Booze, MD;  Location: [facility];  Service: Cardiovascular;  Laterality: N/A;  . OVARIAN CYST REMOVAL Right 1999  . PVC ABLATION N/A 12/01/2017   Procedure: PVC ABLATION;  Surgeon: Evans Lance, MD;  Location: [facility];  Service: Cardiovascular;  Laterality: N/A;  . RIGHT HEART CATH N/A 08/05/2017   Procedure: RIGHT HEART CATH;  Surgeon: Jolaine Artist, MD;  Location: [facility] [facility];  Service: Cardiovascular;  Laterality: N/A;  . TUBAL LIGATION  1999   Social History   Socioeconomic History  . Marital status: Married    Spouse name: Not on file  . Number of children: 1  . Years of education: Not on file  . Highest education level: Not on file  Occupational History  . Occupation: stay at home mom  Social Needs  . Financial resource strain: Not on file  . Food insecurity:    Worry: Not on file    Inability: Not on file  . Transportation needs:    Medical: Not on file    Non-medical: Not on file  Tobacco Use  . Smoking status: Never Smoker  . Smokeless tobacco: Never Used  Substance and Sexual Activity  . Alcohol use: No  . Drug use: No  . Sexual activity: Yes  Lifestyle  . Physical activity:    Days per week: Not on file    Minutes per session: Not on file  . Stress: Not on file  Relationships  . Social connections:    Talks on phone: Not on file    Gets together: Not on file    Attends religious service: Not on file    Active member of club or organization: Not on file    Attends meetings of clubs or organizations: Not on file    Relationship status: Not on file  Other Topics Concern  . Not on file  Social History Narrative  . Not on file   Allergies  Allergen Reactions    . Aspirin Anaphylaxis and Other (See Comments)    Wheezing, "but patient still takes if she needs to"  . Vancomycin Other (See Comments)    "did something to my kidneys," PROGRESSED TO KIDNEY FAILURE!!  . Contrast Media [Iodinated Diagnostic Agents] Other (See Comments)    Multiple CT contrast studies done over 2 weeks caused ARF  . Avocado Itching and Swelling  . Ciprofloxacin Itching and Rash  . Sulfa Antibiotics Itching and Rash   Family History  Problem Relation Age of Onset  . Emphysema Maternal Grandmother        smoked  . Heart disease Maternal Grandmother 75       MI  . Rheum arthritis Mother   . Allergies Daughter   . Colon  cancer Neg Hx      Past medical history, social, surgical and family history all reviewed in electronic medical record.  No pertanent information unless stated regarding to the chief complaint.   Review of Systems:Review of systems updated and as accurate as of 01/03/18  No headache, visual changes, nausea, vomiting, diarrhea, constipation, dizziness, abdominal pain, skin rash, fevers, chills, night sweats, weight loss, swollen lymph nodes, body aches, joint swelling, muscle aches, chest pain, shortness of breath, mood changes.   Objective  Blood pressure 110/78, pulse 72, weight 213 lb (96.6 kg), SpO2 97 %. Systems examined below as of 01/03/18   General: No apparent distress alert and oriented x3 mood and affect normal, dressed appropriately.  HEENT: Pupils equal, extraocular movements intact  Respiratory: [name] speak in full sentences and does not appear short of breath  Cardiovascular: No lower extremity edema, non tender, no erythema  Skin: Warm dry intact with no signs of infection or rash on extremities or on axial skeleton.  Abdomen: Soft nontender  Neuro: Cranial nerves II through XII are intact, neurovascularly intact in all extremities with 2+ DTRs and 2+ pulses.  Lymph: No lymphadenopathy of posterior or anterior cervical chain or  axillae bilaterally.  Gait normal with good balance and coordination.  MSK:  Non tender with full range of motion and good stability and symmetric strength and tone of  elbows, wrist, hip, knee and ankles bilaterally.  Shoulder: Right Inspection reveals no abnormalities, atrophy or asymmetry. Palpation is normal with no tenderness over AC joint or bicipital groove. ROM is full in all planes. Rotator cuff strength normal throughout. No signs of impingement with negative Neer and Hawkin's tests, empty can sign. Speeds and Yergason's tests normal. No labral pathology noted with negative Obrien's, negative clunk and good stability. Mild scapular dyskinesis noted No painful arc and no drop arm sign. No apprehension sign Contralateral shoulder unremarkable  Osteopathic findings C2 flexed rotated and side bent right T3 extended rotated and side bent right inhaled third rib T6 extended rotated and side bent left L2 flexed rotated and side bent right Sacrum right on right       Impression and Recommendations:     This case required medical decision making of moderate complexity.      Note: This dictation was prepared with Dragon dictation along with smaller phrase technology. Any transcriptional errors that result from this process are unintentional.

## 2018-01-03 NOTE — Assessment & Plan Note (Signed)
Likely more secondary to scapular dyskinesis.  Discussed icing regimen, home exercise, which activities to do which wants to avoid.  Increase activity slowly over the course the next several days.  Follow-up in 4 to 8 weeks

## 2018-01-03 NOTE — Assessment & Plan Note (Signed)
Decision today to treat with OMT was based on Physical Exam  After verbal consent patient was treated with HVLA, ME, FPR techniques in cervical, thoracic, rib,  lumbar and sacral areas  Patient tolerated the procedure well with improvement in symptoms  Patient given exercises, stretches and lifestyle modifications  See medications in patient instructions if given  Patient will follow up in 4-8 weeks 

## 2018-01-03 NOTE — Patient Instructions (Signed)
Good to see you  [name] is your friend.  On wall with heels, butt shoulder and head touching for a goal of 5 minutes daily  Stay active Keep hands within peripheral vision  See me again in 6 weeks if not great  Always call [phone]

## 2018-01-05 NOTE — Progress Notes (Signed)
[facility] [employer] PRE[facility] Weekly Session   Patient Details  Name: [name] MRN: [id number] Date of Birth: 06-Jul-1975 Age: 42 y.o. PCP: Hoyt Koch, MD  There were no vitals filed for this visit.  [facility] Weekly seesion - 01/05/18 1200      Weekly Session   Topic Discussed  Healthy eating tips    Minutes exercised this week  95 minutes   45cardio/20strength/72flexibilit     Fun things you did since last meeting:"Watching my husband wash my car" Things you are grateful for:"life" Nutrition celebrations:"started drinking more protein" Barriers:"eating enough"  [name] 01/05/2018, 12:48 PM

## 2018-01-07 ENCOUNTER — Encounter: Payer: Self-pay | Admitting: Internal Medicine

## 2018-01-07 ENCOUNTER — Ambulatory Visit (INDEPENDENT_AMBULATORY_CARE_PROVIDER_SITE_OTHER): Payer: Medicare Other | Admitting: Internal Medicine

## 2018-01-07 VITALS — BP 110/60 | HR 78 | Ht 65.0 in | Wt 202.0 lb

## 2018-01-07 DIAGNOSIS — I428 Other cardiomyopathies: Secondary | ICD-10-CM

## 2018-01-07 DIAGNOSIS — I493 Ventricular premature depolarization: Secondary | ICD-10-CM

## 2018-01-07 DIAGNOSIS — Z95810 Presence of automatic (implantable) cardiac defibrillator: Secondary | ICD-10-CM | POA: Diagnosis not present

## 2018-01-07 MED ORDER — FLUCONAZOLE 150 MG PO TABS: 150.0000 mg | ORAL_TABLET | Freq: Once | ORAL | 0 refills | Status: AC

## 2018-01-07 NOTE — Progress Notes (Signed)
HPI [name] returns today for ongoing evaluation and management of her ICD and CHF. She is a pleasant 42 yo woman with the above problems and HTN. She is overweight. In the interim, she has not had any ICD shocks, and no chest pain or sob.  Allergies  Allergen Reactions  . Aspirin Anaphylaxis and Other (See Comments)    Wheezing, "but patient still takes if she needs to"  . Vancomycin Other (See Comments)    "did something to my kidneys," PROGRESSED TO KIDNEY FAILURE!!  . Contrast Media [Iodinated Diagnostic Agents] Other (See Comments)    Multiple CT contrast studies done over 2 weeks caused ARF  . Avocado Itching and Swelling  . Ciprofloxacin Itching and Rash  . Sulfa Antibiotics Itching and Rash     Current Outpatient Medications  Medication Sig Dispense Refill  . albuterol (PROVENTIL HFA;VENTOLIN HFA) 108 (90 Base) MCG/ACT inhaler Inhale 1-2 puffs into the lungs every 6 (six) hours as needed for wheezing or shortness of breath. **PT NEEDS TO ESTABLISH WITH ANOTHER PROVIDER FOR ADDITIONAL REFILLS** (Patient taking differently: Inhale 1-2 puffs into the lungs every 6 (six) hours as needed for wheezing or shortness of breath. ) 1 Inhaler 0  . blood glucose meter kit and supplies Dispense based on patient and insurance preference. Use up to two times daily as directed. (FOR ICD-10 E10.9, E11.9). 1 each 0  . carvedilol (COREG) 12.5 MG tablet Take 1 tablet (12.5 mg total) by mouth 2 (two) times daily with a meal. 60 tablet 6  . CORLANOR 7.5 MG TABS tablet TAKE 1 TABLET (7.5 MG TOTAL) BY MOUTH 2 (TWO) TIMES DAILY WITH A MEAL. 180 tablet 3  . Diclofenac Sodium 2 % SOLN Place 2 g onto the skin 2 (two) times daily. 112 g 3  . dicyclomine (BENTYL) 20 MG tablet Take 20 mg by mouth 2 (two) times daily as needed for spasms.    . flecainide (TAMBOCOR) 100 MG tablet Take 1 tablet (100 mg total) by mouth every 12 (twelve) hours. 60 tablet 1  . gabapentin (NEURONTIN) 100 MG capsule Take 200  mg by mouth at bedtime as needed (for pain).     Marland Kitchen lidocaine (LIDODERM) 5 % Place 1 patch onto the skin daily. Remove & Discard patch within 12 hours or as directed by MD 30 patch 0  . linaclotide (LINZESS) 145 MCG CAPS capsule Take 1 capsule (145 mcg total) by mouth daily before breakfast. 30 capsule 3  . Multiple Vitamins-Minerals (MULTIVITAMIN GUMMIES WOMENS PO) Take 2 each by mouth daily.    . nitroGLYCERIN (NITROSTAT) 0.4 MG SL tablet PLACE 1 TABLET (0.4 MG TOTAL) UNDER THE TONGUE EVERY 5 (FIVE) MINUTES AS NEEDED FOR CHEST PAIN. 75 tablet 0  . omeprazole (PRILOSEC) 20 MG capsule Take 20 mg by mouth daily.    . potassium chloride SA (K-DUR,KLOR-CON) 20 MEQ tablet Take 1 tablet (20 mEq total) by mouth daily. Take extra tablet on days you take Torsemide. (Patient taking differently: Take 20-40 mEq by mouth See admin instructions. Take 20 mEq by mouth once a day and an extra 20 mEq on Mon/Wed/Fri) 45 tablet 11  . RISEDRONATE SODIUM PO Take 1 capsule by mouth every 28 (twenty-eight) days.    . sacubitril-valsartan (ENTRESTO) 49-51 MG Take 1 tablet by mouth 2 (two) times daily. 60 tablet 3  . spironolactone (ALDACTONE) 25 MG tablet Take 25 mg by mouth daily.    Marland Kitchen torsemide (DEMADEX) 20 MG tablet Take  2 tablets (40 mg total) by mouth daily. 60 tablet 11  . Vitamin D, Ergocalciferol, (DRISDOL) 50000 units CAPS capsule TAKE 1 CAPSULE (50,000 UNITS TOTAL) BY MOUTH EVERY 7 (SEVEN) DAYS. (Patient taking differently: Take 50,000 Units by mouth every Wednesday. ) 12 capsule 0  . zolpidem (AMBIEN) 5 MG tablet Take 1 tablet (5 mg total) by mouth at bedtime as needed for sleep. 30 tablet 3   No current facility-administered medications for this visit.      Past Medical History:  Diagnosis Date  . Acute on chronic systolic (congestive) heart failure ([name]) 04/29/2017  . Acute pain of right shoulder 06/16/2017  . AICD (automatic cardioverter/defibrillator) present   . Anxiety   . Arthritis    right shoulder    . Asthma   . CHF (congestive heart failure) ([name])   . Chronic combined systolic and diastolic heart failure ([place]) 03/05/2014  . Closed low lateral malleolus fracture 10/23/2013  . Cystitis 10/21/2017  . Depression   . Depression with anxiety 01/20/2013  . Diabetes mellitus without complication ([place])   . Diverticulosis   . Dyspnea    comes and goes intermittently mostly with exertion   . Essential hypertension    Prev followed by [facility]/ Cardiology   . Fibroid    age 59  . Gallstones   . Generalized abdominal cramping   . History of cardiomyopathy   . Hypertension   . IBS (irritable bowel syndrome)   . 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 Insomnia 05/07/2017  . Labral tear of shoulder 04/04/2015    Injected 04/04/2015 Injected 12/03/2015   . Migraine    "monthly" (08/03/2016)  . Myofascial pain 06/16/2017  . NICM (nonischemic cardiomyopathy) ([place]) 08/03/2016  . Nonallopathic lesion of lumbosacral region 11/16/2016  . Nonallopathic lesion of sacral region 11/16/2016  . Nonallopathic lesion of thoracic region 08/20/2014  . Nonspecific chest pain 04/28/2017  . OSA (obstructive sleep apnea) 01/02/2013   NPSG 2009:  AHI 9/hr. CPAP intolerance >> "smothering" Good tolerance of auto device (optimal pressure 12-13 on download).  - referred to Dr Gwenette Greet    . OSA on CPAP   . Ovarian cyst    1999; surgically removed  . Patellofemoral syndrome of both knees 10/16/2016  . 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 PVC (premature ventricular contraction) 06/23/2016  . Seizures ([place])    "as a child" (08/03/2016)  . Termination of pregnancy    due to cardiac risk    ROS:   All systems reviewed and negative except as noted in the HPI.   Past Surgical History:  Procedure Laterality Date  . CARDIAC CATHETERIZATION N/A 11/11/2015   Procedure: Right/Left Heart Cath and Coronary Angiography;  Surgeon: Troy Sine, MD;  Location: [place] [facility];  Service: Cardiovascular;  Laterality: N/A;  . CARDIAC CATHETERIZATION  ~ 2015  . CARDIAC DEFIBRILLATOR PLACEMENT  08/03/2016  . CESAREAN SECTION  1999  . COLONOSCOPY WITH PROPOFOL N/A 04/21/2016   Procedure: COLONOSCOPY WITH PROPOFOL;  Surgeon: Jerene Bears, MD;  Location: [facility];  Service: Gastroenterology;  Laterality: N/A;  . ESOPHAGOGASTRODUODENOSCOPY (EGD) WITH PROPOFOL N/A 04/21/2016   Procedure: ESOPHAGOGASTRODUODENOSCOPY (EGD) WITH PROPOFOL;  Surgeon: Jerene Bears, MD;  Location: [facility];  Service: Gastroenterology;  Laterality: N/A;  . FOOT FRACTURE SURGERY Right ~ 2003  . FRACTURE SURGERY    . ICD IMPLANT N/A 08/03/2016   Procedure: ICD Implant;  Surgeon: Deboraha Sprang,  MD;  Location: [place] [facility];  Service: Cardiovascular;  Laterality: N/A;  . LAPAROSCOPIC CHOLECYSTECTOMY  12/2006  . LAPAROSCOPIC GASTRIC SLEEVE RESECTION    . LAPAROSCOPY ABDOMEN DIAGNOSTIC  2008   "cut bile duct w/gallbladder OR; had to go in later & fix leak; hospitalized for 2 months"  . LEFT HEART CATHETERIZATION WITH CORONARY ANGIOGRAM N/A 02/26/2014   Procedure: LEFT HEART CATHETERIZATION WITH CORONARY ANGIOGRAM;  Surgeon: Jettie Booze, MD;  Location: [facility] CATH LAB;  Service: Cardiovascular;  Laterality: N/A;  . OVARIAN CYST REMOVAL Right 1999  . PVC ABLATION N/A 12/01/2017   Procedure: PVC ABLATION;  Surgeon: Evans Lance, MD;  Location: [place] [facility];  Service: Cardiovascular;  Laterality: N/A;  . RIGHT HEART CATH N/A 08/05/2017   Procedure: RIGHT HEART CATH;  Surgeon: Jolaine Artist, MD;  Location: [place] [facility];  Service: Cardiovascular;  Laterality: N/A;  . TUBAL LIGATION  1999     Family History  Problem Relation Age of Onset  . Emphysema Maternal Grandmother        smoked  . Heart disease Maternal Grandmother 86       MI  . Rheum arthritis Mother   . Allergies Daughter   . Colon cancer Neg Hx      Social History   Socioeconomic  History  . Marital status: Married    Spouse name: Not on file  . Number of children: 1  . Years of education: Not on file  . Highest education level: Not on file  Occupational History  . Occupation: stay at home mom  Social Needs  . Financial resource strain: Not on file  . Food insecurity:    Worry: Not on file    Inability: Not on file  . Transportation needs:    Medical: Not on file    Non-medical: Not on file  Tobacco Use  . Smoking status: Never Smoker  . Smokeless tobacco: Never Used  Substance and Sexual Activity  . Alcohol use: No  . Drug use: No  . Sexual activity: Yes  Lifestyle  . Physical activity:    Days per week: Not on file    Minutes per session: Not on file  . Stress: Not on file  Relationships  . Social connections:    Talks on phone: Not on file    Gets together: Not on file    Attends religious service: Not on file    Active member of club or organization: Not on file    Attends meetings of clubs or organizations: Not on file    Relationship status: Not on file  . Intimate partner violence:    Fear of current or ex partner: Not on file    Emotionally abused: Not on file    Physically abused: Not on file    Forced sexual activity: Not on file  Other Topics Concern  . Not on file  Social History Narrative  . Not on file     BP 110/60   Pulse 78   Ht 5' 5"  (1.651 m)   Wt 202 lb (91.6 kg)   BMI 33.61 kg/m   Physical Exam:  Well appearing NAD HEENT: Unremarkable Neck:  No JVD, no thyromegally Lymphatics:  No adenopathy Back:  No CVA tenderness Lungs:  Clear HEART:  Regular rate rhythm, no murmurs, no rubs, no clicks Abd:  soft, positive bowel sounds, no organomegally, no rebound, no guarding Ext:  2 plus pulses, no edema, no cyanosis,  no clubbing Skin:  No rashes no nodules Neuro:  CN II through XII intact, motor grossly intact  EKG - nsr with right axis.   DEVICE  Normal device function.  See PaceArt for details.    Assess/Plan: 1. Chronic systolic heart failure - her symptoms are class 2A. SHe will continue her current meds. 2. ICD - she has had no therapies. Her [employer] device is working normally and has over 13 years of battery longevity. 3. PVC's - her symptoms are well controlled, s/p ablation.   Mikle Bosworth.D.

## 2018-01-07 NOTE — Patient Instructions (Signed)
Medication Instructions:  Your physician recommends that you continue on your current medications as directed. Please refer to the Current Medication list given to you today.  Labwork: None ordered.  Testing/Procedures: None ordered.  Follow-Up: Your physician wants you to follow-up in: one year with Dr. Lovena Le.   You will receive a reminder letter in the mail two months in advance. If you don't receive a letter, please call our office to schedule the follow-up appointment.  Remote monitoring is used to monitor your ICD from home. This monitoring reduces the number of office visits required to check your device to one time per year. It allows [place] to keep an eye on the functioning of your device to ensure it is working properly. You are scheduled for a device check from home on 02/07/2018. You may send your transmission at any time that day. If you have a wireless device, the transmission will be sent automatically. After your physician reviews your transmission, you will receive a postcard with your next transmission date.  Any Other Special Instructions Will Be Listed Below (If Applicable).  If you need a refill on your cardiac medications before your next appointment, please call your pharmacy.

## 2018-01-08 LAB — CUP PACEART INCLINIC DEVICE CHECK
Date Time Interrogation Session: 20190816040000
HighPow Impedance: 68 Ohm
Implantable Lead Implant Date: 20180312
Implantable Lead Location: 753860
Implantable Lead Model: 293
Implantable Lead Serial Number: 422842
Implantable Pulse Generator Implant Date: 20180312
Lead Channel Impedance Value: 848 Ohm
Lead Channel Pacing Threshold Amplitude: 0.9 V
Lead Channel Pacing Threshold Pulse Width: 0.4 ms
Lead Channel Sensing Intrinsic Amplitude: 13.2 mV
Lead Channel Setting Pacing Amplitude: 2.5 V
Lead Channel Setting Pacing Pulse Width: 0.4 ms
Lead Channel Setting Sensing Sensitivity: 0.5 mV
Pulse Gen Serial Number: 226601

## 2018-01-10 ENCOUNTER — Other Ambulatory Visit: Payer: Self-pay | Admitting: Family Medicine

## 2018-01-14 ENCOUNTER — Ambulatory Visit (INDEPENDENT_AMBULATORY_CARE_PROVIDER_SITE_OTHER): Payer: Medicare Other | Admitting: Internal Medicine

## 2018-01-14 ENCOUNTER — Encounter: Payer: Self-pay | Admitting: Internal Medicine

## 2018-01-14 ENCOUNTER — Other Ambulatory Visit (INDEPENDENT_AMBULATORY_CARE_PROVIDER_SITE_OTHER): Payer: Medicare Other

## 2018-01-14 VITALS — BP 110/70 | HR 50 | Temp 98.1°F | Ht 65.0 in | Wt 205.0 lb

## 2018-01-14 DIAGNOSIS — R109 Unspecified abdominal pain: Secondary | ICD-10-CM | POA: Diagnosis not present

## 2018-01-14 LAB — CBC
HCT: 38 % (ref 36.0–46.0)
Hemoglobin: 12.7 g/dL (ref 12.0–15.0)
MCHC: 33.3 g/dL (ref 30.0–36.0)
MCV: 89.9 fl (ref 78.0–100.0)
Platelets: 200 10*3/uL (ref 150.0–400.0)
RBC: 4.23 Mil/uL (ref 3.87–5.11)
RDW: 13.6 % (ref 11.5–15.5)
WBC: 5.1 10*3/uL (ref 4.0–10.5)

## 2018-01-14 LAB — LIPASE: Lipase: 33 U/L (ref 11.0–59.0)

## 2018-01-14 LAB — COMPREHENSIVE METABOLIC PANEL
ALT: 10 U/L (ref 0–35)
AST: 13 U/L (ref 0–37)
Albumin: 4.2 g/dL (ref 3.5–5.2)
Alkaline Phosphatase: 71 U/L (ref 39–117)
BUN: 12 mg/dL (ref 6–23)
CO2: 30 mEq/L (ref 19–32)
Calcium: 10 mg/dL (ref 8.4–10.5)
Chloride: 103 mEq/L (ref 96–112)
Creatinine, Ser: 0.9 mg/dL (ref 0.40–1.20)
GFR: 87.56 mL/min (ref >60.00)
Glucose, Bld: 97 mg/dL (ref 70–99)
Potassium: 3.7 mEq/L (ref 3.5–5.1)
Sodium: 139 mEq/L (ref 135–145)
Total Bilirubin: 0.6 mg/dL (ref 0.2–1.2)
Total Protein: 7.4 g/dL (ref 6.0–8.3)

## 2018-01-14 NOTE — Patient Instructions (Signed)
We will check the labs today.   We want you to try tylenol for the pain 1-2 pills up to 3 times per day to see if this can get better.   If it is not helping or getting better by Monday let us know and we can order a CT scan of the stomach.

## 2018-01-14 NOTE — Assessment & Plan Note (Signed)
Not consistent with kidney stones, pancreatitis, diverticulitis. She is not constipated. It is unclear if this could be related to her gastric procedure. Will try tylenol for pain relief and check CBC, CMP, lipase today. If pain does not improve would need CT abdomen with contrast to evaluate gastric procedure.

## 2018-01-14 NOTE — Progress Notes (Signed)
   Subjective:    Patient ID: [name], female    DOB: Nov 04, 1975, 42 y.o.   MRN: [id number]  HPI The patient is a 42 YO female coming in for left side pain. Started about 1-2 weeks ago. Is a discomfort which is worse with exercising or bending a certain way. Does not feel similar to muscle strain. She has not tried anything for it. 2-3/10. Denies change in pain with eating, not eating. Denies nausea or vomiting. Denies diarrhea or constipation. Denies blood in stool. Did have gastric weight loss procedure in April. Denies bloating. Overall stable since onset. Does not radiate.   Review of Systems  Constitutional: Negative.   HENT: Negative.   Eyes: Negative.   Respiratory: Negative for cough, chest tightness and shortness of breath.   Cardiovascular: Negative for chest pain, palpitations and leg swelling.  Gastrointestinal: Positive for abdominal pain. Negative for abdominal distention, constipation, diarrhea, nausea and vomiting.  Musculoskeletal: Positive for myalgias.  Skin: Negative.   Neurological: Negative.   Psychiatric/Behavioral: Negative.       Objective:   Physical Exam  Constitutional: She is oriented to person, place, and time. She appears well-developed and well-nourished.  HENT:  Head: Normocephalic and atraumatic.  Eyes: EOM are normal.  Neck: Normal range of motion.  Cardiovascular: Normal rate and regular rhythm.  Pulmonary/Chest: Effort normal and breath sounds normal. No respiratory distress. She has no wheezes. She has no rales.  Abdominal: Soft. Bowel sounds are normal. She exhibits no distension. There is tenderness. There is no rebound.  Pain under left rib cage tender to touch, no pain in the back or RUQ or LLQ or RLQ  Musculoskeletal: She exhibits no edema.  Neurological: She is alert and oriented to person, place, and time. Coordination normal.  Skin: Skin is warm and dry.   Vitals:   01/14/18 1048  BP: 110/70  Pulse: (!) 50  Temp: 98.1 F (36.7  C)  TempSrc: Oral  SpO2: 98%  Weight: 205 lb (93 kg)  Height: 5\' 5"  (1.651 m)      Assessment & Plan:

## 2018-01-17 ENCOUNTER — Other Ambulatory Visit: Payer: Self-pay | Admitting: *Deleted

## 2018-01-17 ENCOUNTER — Encounter: Payer: Self-pay | Admitting: Family Medicine

## 2018-01-17 ENCOUNTER — Encounter: Payer: Self-pay | Admitting: Internal Medicine

## 2018-01-17 MED ORDER — LIDOCAINE 5 % EX PTCH: 1.0000 | MEDICATED_PATCH | CUTANEOUS | 6 refills | Status: DC

## 2018-01-17 MED ORDER — IBUPROFEN-FAMOTIDINE 800-26.6 MG PO TABS: 1.0000 | ORAL_TABLET | Freq: Three times a day (TID) | ORAL | 3 refills | Status: DC | PRN

## 2018-01-18 ENCOUNTER — Telehealth: Payer: Self-pay

## 2018-01-18 ENCOUNTER — Ambulatory Visit (INDEPENDENT_AMBULATORY_CARE_PROVIDER_SITE_OTHER): Payer: BLUE CROSS/BLUE SHIELD

## 2018-01-18 DIAGNOSIS — I5042 Chronic combined systolic (congestive) and diastolic (congestive) heart failure: Secondary | ICD-10-CM

## 2018-01-18 DIAGNOSIS — Z95810 Presence of automatic (implantable) cardiac defibrillator: Secondary | ICD-10-CM

## 2018-01-18 NOTE — Telephone Encounter (Signed)
Remote ICM transmission received.  Attempted call to patient and left message, per DPR, to return call regarding transmission.

## 2018-01-18 NOTE — Progress Notes (Signed)
EPIC Encounter for ICM Monitoring  Patient Name: [name] is a 42 y.o. female Date: 01/18/2018 Primary Care Physican: Crawford, Elizabeth A, MD Primary Cardiologist: Smith/Bensimhon Electrophysiologist: Klein Dry Weight:Previous weight 201lbs        Attempted call to patient and unable to reach.  Left message to return call regarding transmission.  Transmission reviewed.    HeartLogic Heart Failure Alerted today and Index is 17.  Index has crossed the threshold. Thoracic impedance, S3, S3/S1 Ratio, Night heart rate and respiratory rate all worsening since 8/26 suggesting possible fluid accumulation.  Prescribed dosage: Torsemide20 mgTake 2 tablets (40 mg total) by mouth daily.  Labs: 11/24/2017 Creatinine 1.02, BUN 12, Potassium 3.9, Sodium 137, EGFR >60 11/23/2017 Creatinine 1.08, BUN 11, Potassium 3.5, Sodium 138, EGFR >60  11/22/2017 Creatinine 0.84, BUN 8,   Potassium 3.3, Sodium 140, EGFR >60  11/21/2017 Creatinine 0.81, BUN 9,   Potassium 3.7, Sodium 140, EGFR >60  11/04/2017 Creatinine0.82, BUN6, Potassium3.4, Sodium141, EGFR>60 10/07/2017 Creatinine1.05, BUN18, Potassium3.4, Sodium137, EGFR>60  08/05/2017 Creatinine1.10, BUN17, Potassium3.5, Sodium135, EGFR>60  07/21/2017 Creatinine0.9, BUN 16, Potassium4.1, Sodium137  06/11/2017 Creatinine1.11, BUN23, Potassium4.1, Sodium136 A complete set of results can be found in Results Review.  Recommendations: Left voice mail with ICM number and encouraged to call if experiencing any fluid symptoms.  Follow-up plan: ICM clinic phone appointment on 01/31/2018 to recheck fluid levels.         Copy of ICM check sent to Dr. Klein and [name] for review and if any recommendations will call back.    3 Month Trend    8 Day Data Trend            Laurie S Short, RN 01/18/2018 7:56 AM    

## 2018-01-21 NOTE — Progress Notes (Signed)
[facility] PREP Weekly Session   Patient Details  Name: [name] MRN: [id number] Date of Birth: [birth year] Age: 42 y.o. PCP: Hoyt Koch, MD  Vitals:   01/17/18 1440  Weight: 202 lb 12.8 oz (92 kg)    [facility] Weekly seesion - 01/21/18 1400      Weekly Session   Minutes exercised this week  130 minutes   60cardio/40strength/70flexibility     Fun things you did since last meeting:"made a dress" Things you are grateful for:"Life. Losing inches.  Kids back to school." Nutrition celebrations:"None-too many.  P[employer] chickens" Barriers:"sleeping enough"  [name] 01/21/2018, 2:41 PM

## 2018-01-28 ENCOUNTER — Other Ambulatory Visit: Payer: Self-pay | Admitting: Nurse Practitioner

## 2018-01-31 ENCOUNTER — Ambulatory Visit (INDEPENDENT_AMBULATORY_CARE_PROVIDER_SITE_OTHER): Payer: BLUE CROSS/BLUE SHIELD

## 2018-01-31 ENCOUNTER — Telehealth: Payer: Self-pay

## 2018-01-31 DIAGNOSIS — I5042 Chronic combined systolic (congestive) and diastolic (congestive) heart failure: Secondary | ICD-10-CM

## 2018-01-31 DIAGNOSIS — Z95810 Presence of automatic (implantable) cardiac defibrillator: Secondary | ICD-10-CM

## 2018-01-31 NOTE — Telephone Encounter (Signed)
LMOVM reminding pt to send remote transmission.   

## 2018-02-01 NOTE — Progress Notes (Signed)
Patient returned call and stated she is feeling really well.  She denied any fluid symptoms.  Recommended to limit salt intake.  She will call if she develops any fluid symptoms.  She says she thinks she feels a little dry.  Will recheck fluid levels 02/07/18.

## 2018-02-01 NOTE — Progress Notes (Signed)
EPIC Encounter for ICM Monitoring  Patient Name: [name] is a 42 y.o. female Date:    01/18/2018 Primary Care Physican: Hoyt Koch, MD Primary Cardiologist: Smith/Bensimhon Electrophysiologist: Faustino Congress Weight: 195.3lbs                                                                       Attempted call to patient and unable to reach.  Left message to return call regarding transmission.  Transmission reviewed.    HeartLogic Heart Failure Index is 12 which has decreased from 17 on 01/18/2018 but still alerting due to index remains above recovery threshold of 6.  Prescribed dosage: Torsemide20 mgTake 2 tablets (40 mg total) by mouth daily.  Labs: 11/24/2017 Creatinine1.02, BUN12, Potassium3.9, Sodium137, EGFR>60 11/23/2017 Creatinine1.08, BUN11, Potassium3.5, Sodium138, EGFR>60  11/22/2017 Creatinine0.84, BUN8, Potassium 3.3, Sodium140, EGFR>60  11/21/2017 Creatinine0.81, BUN9, Potassium 3.7, Sodium140, EGFR>60 11/04/2017 Creatinine0.82, BUN6, Potassium3.4, Sodium141, EGFR>60 10/07/2017 Creatinine1.05, BUN18, Potassium3.4, [id number], EGFR>60  08/05/2017 Creatinine1.10, BUN17, Potassium3.5, Sodium135, EGFR>60  07/21/2017 Creatinine0.9, BUN 16, Potassium4.1, Sodium137  06/11/2017 Creatinine1.11, BUN23, Potassium4.1, Sodium136 A complete set of results can be found in Results Review.  Follow-up plan: ICM clinic phone appointment on 02/07/2018.   Last office visit with HF clinic was 11/11/2017 and advised to follow 11/25/2017.        Copy of ICM check sent to Dr. Haroldine Laws and Dr. Caryl Comes.    3 Month Trend     8 Day Data Trend               [name], RN 02/01/2018 12:15 PM

## 2018-02-02 DIAGNOSIS — Z1231 Encounter for screening mammogram for malignant neoplasm of breast: Secondary | ICD-10-CM | POA: Diagnosis not present

## 2018-02-02 DIAGNOSIS — Z01419 Encounter for gynecological examination (general) (routine) without abnormal findings: Secondary | ICD-10-CM | POA: Diagnosis not present

## 2018-02-05 ENCOUNTER — Other Ambulatory Visit (HOSPITAL_COMMUNITY): Payer: Self-pay | Admitting: Internal Medicine

## 2018-02-06 ENCOUNTER — Encounter (HOSPITAL_COMMUNITY): Payer: Self-pay

## 2018-02-07 ENCOUNTER — Telehealth: Payer: Self-pay | Admitting: Cardiology

## 2018-02-07 ENCOUNTER — Ambulatory Visit (INDEPENDENT_AMBULATORY_CARE_PROVIDER_SITE_OTHER): Payer: Medicare Other

## 2018-02-07 ENCOUNTER — Other Ambulatory Visit (HOSPITAL_COMMUNITY): Payer: Self-pay | Admitting: *Deleted

## 2018-02-07 DIAGNOSIS — Z95810 Presence of automatic (implantable) cardiac defibrillator: Secondary | ICD-10-CM | POA: Diagnosis not present

## 2018-02-07 DIAGNOSIS — I5042 Chronic combined systolic (congestive) and diastolic (congestive) heart failure: Secondary | ICD-10-CM | POA: Diagnosis not present

## 2018-02-07 MED ORDER — IVABRADINE HCL 7.5 MG PO TABS: ORAL_TABLET | ORAL | 2 refills | Status: DC

## 2018-02-07 MED ORDER — POTASSIUM CHLORIDE CRYS ER 20 MEQ PO TBCR: 20.0000 meq | EXTENDED_RELEASE_TABLET | Freq: Every day | ORAL | 2 refills | Status: DC

## 2018-02-07 MED ORDER — CARVEDILOL 12.5 MG PO TABS: 12.5000 mg | ORAL_TABLET | Freq: Two times a day (BID) | ORAL | 2 refills | Status: DC

## 2018-02-07 MED ORDER — TORSEMIDE 20 MG PO TABS: 40.0000 mg | ORAL_TABLET | Freq: Every day | ORAL | 2 refills | Status: DC

## 2018-02-07 MED ORDER — SPIRONOLACTONE 25 MG PO TABS: 25.0000 mg | ORAL_TABLET | Freq: Every day | ORAL | 2 refills | Status: DC

## 2018-02-07 MED ORDER — SACUBITRIL-VALSARTAN 49-51 MG PO TABS
1.0000 | ORAL_TABLET | Freq: Two times a day (BID) | ORAL | 2 refills | Status: DC
Start: 2018-02-07 — End: 2018-06-27

## 2018-02-07 NOTE — Telephone Encounter (Signed)
Spoke with pt and reminded pt of remote transmission that is due today. Pt verbalized understanding.   

## 2018-02-08 ENCOUNTER — Other Ambulatory Visit: Payer: Self-pay | Admitting: Family Medicine

## 2018-02-08 ENCOUNTER — Telehealth: Payer: Self-pay

## 2018-02-08 NOTE — Progress Notes (Signed)
EPIC Encounter for ICM Monitoring  Patient Name: [name] is a 42 y.o. female Date: 02/08/2018 Primary Care Physican: Hoyt Koch, MD Primary Cardiologist: Smith/Bensimhon Electrophysiologist: Faustino Congress Weight:Previous weight195.3lbs         Attempted call to patient and unable to reach.  Left detailed message, per DPR, regarding transmission.  Transmission reviewed.    HeartLogic Heart Failure Index is 6 which is alerting that is above the threshold but has decreased from Index of 12 on 01/31/2018  Prescribed:Torsemide20 mgTake 2 tablets (40 mg total) by mouth daily.  Labs: 11/24/2017 Creatinine1.02, BUN12, Potassium3.9, Sodium137, EGFR>60 11/23/2017 Creatinine1.08, BUN11, Potassium3.5, Sodium138, EGFR>60  11/22/2017 Creatinine0.84, BUN8, Potassium 3.3, Sodium140, EGFR>60  11/21/2017 Creatinine0.81, BUN9, Potassium 3.7, Sodium140, EGFR>60 11/04/2017 Creatinine0.82, BUN6, Potassium3.4, Sodium141, EGFR>60 10/07/2017 Creatinine1.05, BUN18, Potassium3.4, [id number], EGFR>60  08/05/2017 Creatinine1.10, BUN17, Potassium3.5, Sodium135, EGFR>60  07/21/2017 Creatinine0.9, BUN 16, Potassium4.1, Sodium137  06/11/2017 Creatinine1.11, BUN23, Potassium4.1, Sodium136 A complete set of results can be found in Results Review.  Recommendations: Left voice mail with ICM number and encouraged to call if experiencing any fluid symptoms.  Follow-up plan: ICM clinic phone appointment on 03/10/2018.          Copy of ICM check sent to Dr. Caryl Comes.    3 Month Trend    8 Day Data Trend           [name], RN 02/08/2018 8:55 AM

## 2018-02-08 NOTE — Telephone Encounter (Signed)
Remote ICM transmission received.  Attempted call to patient and left detailed message, per DPR, regarding transmission and next ICM scheduled for 03/03/2018.  Advised to return call for any fluid symptoms or questions.    

## 2018-02-11 NOTE — Progress Notes (Signed)
[facility] PREP Weekly Session   Patient Details  Name: KENASIA SCHELLER MRN: [id number] Date of Birth: December 25, 1975 Age: 42 y.o. PCP: Hoyt Koch, MD  Vitals:   02/07/18 1424  Weight: 195 lb 11.2 oz (88.8 kg)    [facility] Weekly seesion - 02/11/18 1400      Weekly Session   Topic Discussed  Importance of resistance training    Minutes exercised this week  105 minutes   45cardio/23flexibility     Fun things you did since last meeting:"slept" Things you are grateful for:"life, comforts, new sewing machine." Nutrition celebrations:"made a new recipe.  Went down 2 sizes!!!" Barriers:"being dehydrated"  [name] 02/11/2018, 2:24 PM

## 2018-02-15 ENCOUNTER — Telehealth: Payer: Self-pay

## 2018-02-15 NOTE — Telephone Encounter (Signed)
ICM call to patient.  Advised Heartlogic Index alert was 7 today and starting to increase from normal of 6.  She said she had a long weekend away and ate almonds with sea salt. Her weight increased to 197 lbs 9/23 but has returned back to baseline of 193 lbs today.  She feels better today and can correlate the salty nuts with weight gain.  She said she is back on track today.  Advised to call if symptoms return or worsen.  No changes today.

## 2018-02-16 ENCOUNTER — Encounter: Payer: Self-pay | Admitting: Family Medicine

## 2018-02-16 ENCOUNTER — Ambulatory Visit (INDEPENDENT_AMBULATORY_CARE_PROVIDER_SITE_OTHER): Payer: Medicare Other | Admitting: Family Medicine

## 2018-02-16 VITALS — BP 124/90 | HR 98 | Ht 65.0 in | Wt 203.0 lb

## 2018-02-16 DIAGNOSIS — M940 Chondrocostal junction syndrome [Tietze]: Secondary | ICD-10-CM | POA: Diagnosis not present

## 2018-02-16 DIAGNOSIS — M999 Biomechanical lesion, unspecified: Secondary | ICD-10-CM | POA: Diagnosis not present

## 2018-02-16 NOTE — Patient Instructions (Signed)
6-12 weeks 

## 2018-02-16 NOTE — Assessment & Plan Note (Signed)
He has responded well to manipulation previously.  We discussed icing regimen and home exercise.  Discussed which activities of doing which wants to avoid.  Discussed topical anti-inflammatories, discussed posture and ergonomics.  Patient has lost a significant amount of weight and encourage patient to continue to do so.  Follow-up with me again in 4 to 8 weeks

## 2018-02-16 NOTE — Assessment & Plan Note (Signed)
Decision today to treat with OMT was based on Physical Exam  After verbal consent patient was treated with HVLA, ME, FPR techniques in cervical, thoracic, rib,  lumbar and sacral areas  Patient tolerated the procedure well with improvement in symptoms  Patient given exercises, stretches and lifestyle modifications  See medications in patient instructions if given  Patient will follow up in 4-8 weeks 

## 2018-02-16 NOTE — Progress Notes (Signed)
[name] [address], [place] [address] Phone: [phone] Subjective:    I [name] am serving as a Education administrator for Dr. Hulan Saas.    CC: Right shoulder pain  OZH:[id number]  [name] is a 42 y.o. female coming in with complaint of shoulder pain. States that she is doing a little better. Has numbness in her hands.  Patient has responded well long-term manipulation previously.  Patient feels like it is the same.  Was doing better for some time and started having worsening pain again.  Starting to wake her up at night again.    Past Medical History:  Diagnosis Date  . Acute on chronic systolic (congestive) heart failure ([place]) 04/29/2017  . Acute pain of right shoulder 06/16/2017  . AICD (automatic cardioverter/defibrillator) present   . Anxiety   . Arthritis    right shoulder   . Asthma   . CHF (congestive heart failure) ([place])   . Chronic combined systolic and diastolic heart failure ([place]) 03/05/2014  . Closed low lateral malleolus fracture 10/23/2013  . Cystitis 10/21/2017  . Depression   . Depression with anxiety 01/20/2013  . Diabetes mellitus without complication ([place])   . Diverticulosis   . Dyspnea    comes and goes intermittently mostly with exertion   . Essential hypertension    Prev followed by [facility]/ Cardiology   . Fibroid    age 67  . Gallstones   . Generalized abdominal cramping   . History of cardiomyopathy   . Hypertension   . IBS (irritable bowel syndrome)   . 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 Insomnia 05/07/2017  . Labral tear of shoulder 04/04/2015    Injected 04/04/2015 Injected 12/03/2015   . Migraine    "monthly" (08/03/2016)  . Myofascial pain 06/16/2017  . NICM (nonischemic cardiomyopathy) ([place]) 08/03/2016  . Nonallopathic lesion of lumbosacral region 11/16/2016  . Nonallopathic lesion of sacral region 11/16/2016  . Nonallopathic lesion of thoracic region  08/20/2014  . Nonspecific chest pain 04/28/2017  . OSA (obstructive sleep apnea) 01/02/2013   NPSG 2009:  AHI 9/hr. CPAP intolerance >> "smothering" Good tolerance of auto device (optimal pressure 12-13 on download).  - referred to Dr Gwenette Greet    . OSA on CPAP   . Ovarian cyst    1999; surgically removed  . Patellofemoral syndrome of both knees 10/16/2016  . 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 PVC (premature ventricular contraction) 06/23/2016  . Seizures ([place])    "as a child" (08/03/2016)  . Termination of pregnancy    due to cardiac risk   Past Surgical History:  Procedure Laterality Date  . CARDIAC CATHETERIZATION N/A 11/11/2015   Procedure: Right/Left Heart Cath and Coronary Angiography;  Surgeon: Troy Sine, MD;  Location: [place] [facility];  Service: Cardiovascular;  Laterality: N/A;  . CARDIAC CATHETERIZATION  ~ 2015  . CARDIAC DEFIBRILLATOR PLACEMENT  08/03/2016  . CESAREAN SECTION  1999  . COLONOSCOPY WITH PROPOFOL N/A 04/21/2016   Procedure: COLONOSCOPY WITH PROPOFOL;  Surgeon: Jerene Bears, MD;  Location: [facility];  Service: Gastroenterology;  Laterality: N/A;  . ESOPHAGOGASTRODUODENOSCOPY (EGD) WITH PROPOFOL N/A 04/21/2016   Procedure: ESOPHAGOGASTRODUODENOSCOPY (EGD) WITH PROPOFOL;  Surgeon: Jerene Bears, MD;  Location: [facility];  Service: Gastroenterology;  Laterality: N/A;  . FOOT FRACTURE SURGERY Right ~ 2003  . FRACTURE SURGERY    . ICD IMPLANT N/A 08/03/2016  Procedure: ICD Implant;  Surgeon: Deboraha Sprang, MD;  Location: [place] [facility];  Service: Cardiovascular;  Laterality: N/A;  . LAPAROSCOPIC CHOLECYSTECTOMY  12/2006  . LAPAROSCOPIC GASTRIC SLEEVE RESECTION    . LAPAROSCOPY ABDOMEN DIAGNOSTIC  2008   "cut bile duct w/gallbladder OR; had to go in later & fix leak; hospitalized for 2 months"  . LEFT HEART CATHETERIZATION WITH CORONARY ANGIOGRAM N/A 02/26/2014   Procedure: LEFT HEART CATHETERIZATION WITH CORONARY ANGIOGRAM;   Surgeon: Jettie Booze, MD;  Location: [facility] C[facility] LAB;  Service: Cardiovascular;  Laterality: N/A;  . OVARIAN CYST REMOVAL Right 1999  . PVC ABLATION N/A 12/01/2017   Procedure: PVC ABLATION;  Surgeon: Evans Lance, MD;  Location: [place] [facility];  Service: Cardiovascular;  Laterality: N/A;  . RIGHT HEART CATH N/A 08/05/2017   Procedure: RIGHT HEART CATH;  Surgeon: Jolaine Artist, MD;  Location: [place] [facility];  Service: Cardiovascular;  Laterality: N/A;  . TUBAL LIGATION  1999   Social History   Socioeconomic History  . Marital status: Married    Spouse name: Not on file  . Number of children: 1  . Years of education: Not on file  . Highest education level: Not on file  Occupational History  . Occupation: stay at home mom  Social Needs  . Financial resource strain: Not on file  . Food insecurity:    Worry: Not on file    Inability: Not on file  . Transportation needs:    Medical: Not on file    Non-medical: Not on file  Tobacco Use  . Smoking status: Never Smoker  . Smokeless tobacco: Never Used  Substance and Sexual Activity  . Alcohol use: No  . Drug use: No  . Sexual activity: Yes  Lifestyle  . Physical activity:    Days per week: Not on file    Minutes per session: Not on file  . Stress: Not on file  Relationships  . Social connections:    Talks on phone: Not on file    Gets together: Not on file    Attends religious service: Not on file    Active member of club or organization: Not on file    Attends meetings of clubs or organizations: Not on file    Relationship status: Not on file  Other Topics Concern  . Not on file  Social History Narrative  . Not on file   Allergies  Allergen Reactions  . Aspirin Anaphylaxis and Other (See Comments)    Wheezing, "but patient still takes if she needs to"  . Vancomycin Other (See Comments)    "did something to my kidneys," PROGRESSED TO KIDNEY FAILURE!!  . Contrast Media [Iodinated Diagnostic Agents]  Other (See Comments)    Multiple CT contrast studies done over 2 weeks caused ARF  . Avocado Itching and Swelling  . Ciprofloxacin Itching and Rash  . Sulfa Antibiotics Itching and Rash   Family History  Problem Relation Age of Onset  . Emphysema Maternal Grandmother        smoked  . Heart disease Maternal Grandmother 62       MI  . Rheum arthritis Mother   . Allergies Daughter   . Colon cancer Neg Hx     Current Outpatient Medications (Endocrine & Metabolic):  Marland Kitchen  RISEDRONATE SODIUM PO, Take 1 capsule by mouth every 28 (twenty-eight) days.  Current Outpatient Medications (Cardiovascular):  .  carvedilol (COREG) 12.5 MG tablet, Take 1 tablet (  12.5 mg total) by mouth 2 (two) times daily with a meal. .  flecainide (TAMBOCOR) 100 MG tablet, TAKE 1 TABLET BY MOUTH EVERY 12 HOURS .  ivabradine (CORLANOR) 7.5 MG TABS tablet, TAKE 1 TABLET (7.5 MG TOTAL) BY MOUTH 2 (TWO) TIMES DAILY WITH A MEAL. .  nitroGLYCERIN (NITROSTAT) 0.4 MG SL tablet, PLACE 1 TABLET (0.4 MG TOTAL) UNDER THE TONGUE EVERY 5 (FIVE) MINUTES AS NEEDED FOR CHEST PAIN. .  sacubitril-valsartan (ENTRESTO) 49-51 MG, Take 1 tablet by mouth 2 (two) times daily. Marland Kitchen  spironolactone (ALDACTONE) 25 MG tablet, Take 1 tablet (25 mg total) by mouth daily. Marland Kitchen  torsemide (DEMADEX) 20 MG tablet, Take 2 tablets (40 mg total) by mouth daily.  Current Outpatient Medications (Respiratory):  .  albuterol (PROVENTIL HFA;VENTOLIN HFA) 108 (90 Base) MCG/ACT inhaler, Inhale 1-2 puffs into the lungs every 6 (six) hours as needed for wheezing or shortness of breath. **PT NEEDS TO ESTABLISH WITH ANOTHER PROVIDER FOR ADDITIONAL REFILLS** (Patient taking differently: Inhale 1-2 puffs into the lungs every 6 (six) hours as needed for wheezing or shortness of breath. ) .  budesonide-formoterol (SYMBICORT) 160-4.5 MCG/ACT inhaler, Inhale 2 puffs into the lungs 2 (two) times daily as needed.  Current Outpatient Medications (Analgesics):  Marland Kitchen   Ibuprofen-Famotidine 800-26.6 MG TABS, Take 1 tablet by mouth 3 (three) times daily as needed.   Current Outpatient Medications (Other):  .  blood glucose meter kit and supplies, Dispense based on patient and insurance preference. Use up to two times daily as directed. (FOR ICD-10 E10.9, E11.9). .  Diclofenac Sodium 2 % SOLN, Place 2 g onto the skin 2 (two) times daily. Marland Kitchen  dicyclomine (BENTYL) 20 MG tablet, Take 20 mg by mouth 2 (two) times daily as needed for spasms. Marland Kitchen  gabapentin (NEURONTIN) 100 MG capsule, Take 200 mg by mouth at bedtime as needed (for pain).  Marland Kitchen  lidocaine (LIDODERM) 5 %, Place 1 patch onto the skin daily. Remove & Discard patch within 12 hours or as directed by MD .  linaclotide (LINZESS) 145 MCG CAPS capsule, Take 1 capsule (145 mcg total) by mouth daily before breakfast. .  Multiple Vitamins-Minerals (MULTIVITAMIN GUMMIES WOMENS PO), Take 2 each by mouth daily. Marland Kitchen  omeprazole (PRILOSEC) 20 MG capsule, Take 20 mg by mouth daily. .  potassium chloride SA (K-DUR,KLOR-CON) 20 MEQ tablet, Take 1 tablet (20 mEq total) by mouth daily. Take extra tablet on days you take Torsemide. .  Vitamin D, Ergocalciferol, (DRISDOL) 50000 units CAPS capsule, TAKE 1 CAPSULE (50,000 UNITS TOTAL) BY MOUTH EVERY 7 (SEVEN) DAYS. Marland Kitchen  zolpidem (AMBIEN) 5 MG tablet, Take 1 tablet (5 mg total) by mouth at bedtime as needed for sleep.    Past medical history, social, surgical and family history all reviewed in electronic medical record.  No pertanent information unless stated regarding to the chief complaint.   Review of Systems:  No headache, visual changes, nausea, vomiting, diarrhea, constipation, dizziness, abdominal pain, skin rash, fevers, chills, night sweats, weight loss, swollen lymph nodes, body aches, joint swelling, , chest pain, shortness of breath, mood changes.  Positive muscle aches  Objective  Blood pressure 124/90, pulse 98, height '5\' 5"'$  (1.651 m), weight 203 lb (92.1 kg), SpO2 95  %.    General: No apparent distress alert and oriented x3 mood and affect normal, dressed appropriately.  HEENT: Pupils equal, extraocular movements intact  Respiratory: [name] speak in full sentences and does not appear short of breath  Cardiovascular: No lower extremity  edema, non tender, no erythema  Skin: Warm dry intact with no signs of infection or rash on extremities or on axial skeleton.  Abdomen: Soft nontender  Neuro: Cranial nerves II through XII are intact, neurovascularly intact in all extremities with 2+ DTRs and 2+ pulses.  Lymph: No lymphadenopathy of posterior or anterior cervical chain or axillae bilaterally.  Gait normal with good balance and coordination.  MSK:  Non tender with full range of motion and good stability and symmetric strength and tone of shoulders, elbows, wrist, hip, knee and ankles bilaterally.  Back Exam:  Inspection: Unremarkable  Motion: Flexion 40 deg, Extension 25 deg, Side Bending to 35 deg bilaterally,  Rotation to 30 deg bilaterally  SLR laying: Negative  XSLR laying: Negative  Palpable tenderness: Tender to palpation the paraspinal musculature lumbar spine. FABER: Positive Faber. Sensory change: Gross sensation intact to all lumbar and sacral dermatomes.  Reflexes: 2+ at both patellar tendons, 2+ at achilles tendons, Babinski's downgoing.  Strength at foot  Plantar-flexion: 5/5 Dorsi-flexion: 5/5 Eversion: 5/5 Inversion: 5/5  Leg strength  Quad: 5/5 Hamstring: 5/5 Hip flexor: 5/5 Hip abductors: 4/5 but symmetric Gait unremarkable.   Patient back exam shows the patient does have tightness around the periscapular musculature on the left side.  Some tightness of the neck with rotation to the right and sidebending.  Negative Spurling's.  Neurovascular intact in the upper extremities.  Very mild impingement and positive O'Brien's of the right shoulder  Osteopathic findings  C2 flexed rotated and side bent right C4 flexed rotated and side  bent left T3 extended rotated and side bent right inhaled third rib T9 extended rotated and side bent left L3 flexed rotated and side bent right Sacrum right on right     Impression and Recommendations:     This case required medical decision making of moderate complexity. The above documentation has been reviewed and is accurate and complete Lyndal Pulley, DO       Note: This dictation was prepared with Dragon dictation along with smaller phrase technology. Any transcriptional errors that result from this process are unintentional.

## 2018-02-22 NOTE — Progress Notes (Signed)
[facility] [employer] PREP Weekly Session   Patient Details  Name: [name] MRN: [id number] Date of Birth: 10-Jun-1975 Age: 42 y.o. PCP: Hoyt Koch, MD  Vitals:   02/21/18 1351  Weight: 194 lb 6.4 oz (88.2 kg)    [facility] [employer] Weekly seesion - 02/22/18 1300      Weekly Session   Minutes exercised this week  160 minutes   120cardio/40strength     Things you are grateful for:"life" Barriers:"fluid retention/dehadration/pain"  [name] 02/22/2018, 1:52 PM

## 2018-03-01 ENCOUNTER — Encounter (HOSPITAL_COMMUNITY): Payer: Self-pay

## 2018-03-02 NOTE — Telephone Encounter (Signed)
Called patient and left VM to call me back. Per Nira Conn as long as her weight is stable there isnt much we can adjust over the phone patient is scheduled for an office visit on 10/18 and will be evaluated at that time.

## 2018-03-08 NOTE — Telephone Encounter (Signed)
[place] for colestipol, but also make patient aware that this can cause constipation She should let me know if this happens

## 2018-03-10 ENCOUNTER — Other Ambulatory Visit: Payer: Self-pay | Admitting: Internal Medicine

## 2018-03-10 ENCOUNTER — Ambulatory Visit (INDEPENDENT_AMBULATORY_CARE_PROVIDER_SITE_OTHER): Payer: Medicare Other | Admitting: *Deleted

## 2018-03-10 ENCOUNTER — Ambulatory Visit (INDEPENDENT_AMBULATORY_CARE_PROVIDER_SITE_OTHER): Payer: Medicare Other

## 2018-03-10 ENCOUNTER — Ambulatory Visit: Payer: Medicare Other | Admitting: Internal Medicine

## 2018-03-10 DIAGNOSIS — I5022 Chronic systolic (congestive) heart failure: Secondary | ICD-10-CM

## 2018-03-10 DIAGNOSIS — Z95810 Presence of automatic (implantable) cardiac defibrillator: Secondary | ICD-10-CM | POA: Diagnosis not present

## 2018-03-10 DIAGNOSIS — I428 Other cardiomyopathies: Secondary | ICD-10-CM

## 2018-03-10 MED ORDER — COLESTIPOL HCL 1 G PO TABS: 2.0000 g | ORAL_TABLET | Freq: Two times a day (BID) | ORAL | 1 refills | Status: DC

## 2018-03-10 NOTE — Progress Notes (Signed)
Remote ICD transmission.   

## 2018-03-11 ENCOUNTER — Encounter (HOSPITAL_COMMUNITY): Payer: Medicare Other | Admitting: Internal Medicine

## 2018-03-11 LAB — CUP PACEART REMOTE DEVICE CHECK
Date Time Interrogation Session: 20200101093531
Implantable Lead Implant Date: 20180312
Implantable Lead Location: 753860
Implantable Lead Model: 293
Implantable Lead Serial Number: 422842
Implantable Pulse Generator Implant Date: 20180312
Pulse Gen Serial Number: 226601

## 2018-03-11 NOTE — Progress Notes (Signed)
EPIC Encounter for ICM Monitoring  Patient Name: [name] is a 42 y.o. female Date:    03/10/2018 Primary Care Physican: Hoyt Koch, MD Primary Cardiologist: [facility]/Bensimhon Electrophysiologist: Faustino Congress [place] with patient.  Heart failure questions reviewed.   Transmission reviewed. She reported having a slight fever and cold.     HeartLogic Heart Failure Index is 1 suggesting normal.  Prescribed:Torsemide20 mgTake 2 tablets (40 mg total) by mouth daily.  Labs: 11/24/2017 Creatinine1.02, BUN12, Potassium3.9, Sodium137, EGFR>60 11/23/2017 Creatinine1.08, BUN11, Potassium3.5, Sodium138, EGFR>60  11/22/2017 Creatinine0.84, BUN8, Potassium 3.3, Sodium140, EGFR>60  11/21/2017 Creatinine0.81, BUN9, Potassium 3.7, Sodium140, EGFR>60 11/04/2017 Creatinine0.82, BUN6, Potassium3.4, Sodium141, EGFR>60 10/07/2017 Creatinine1.05, BUN18, Potassium3.4, [id number], EGFR>60  08/05/2017 Creatinine1.10, BUN17, Potassium3.5, Sodium135, EGFR>60  07/21/2017 Creatinine0.9, BUN 16, Potassium4.1, Sodium137  06/11/2017 Creatinine1.11, BUN23, Potassium4.1, Sodium136 A complete set of results can be found in Results Review.  Recommendations:  No changes.  Encouraged to call for fluid symptoms.  Follow-up plan: ICM clinic phone appointment on 04/11/2018.          Copy of ICM check sent to Dr. Caryl Comes.               3 Month Trend

## 2018-03-14 ENCOUNTER — Ambulatory Visit: Payer: Medicare Other | Admitting: Internal Medicine

## 2018-03-14 DIAGNOSIS — Z0289 Encounter for other administrative examinations: Secondary | ICD-10-CM

## 2018-03-14 NOTE — Progress Notes (Signed)
[facility] [employer] PREP Weekly Session   Patient Details  Name: [name] MRN: [id number] Date of Birth: [birth year] Age: 42 y.o. PCP: Hoyt Koch, MD  Vitals:   03/07/18 1450  Weight: 195 lb (88.5 kg)    [facility] [employer] Weekly seesion - 03/14/18 1400      Weekly Session   Topic Discussed  Eating for the season;Water      Things you are grateful for:"life" Nutrition celebrations:"cooking" Barriers:"fluids"  [name] 03/14/2018, 2:51 PM

## 2018-03-21 ENCOUNTER — Ambulatory Visit (INDEPENDENT_AMBULATORY_CARE_PROVIDER_SITE_OTHER): Payer: BLUE CROSS/BLUE SHIELD

## 2018-03-21 DIAGNOSIS — Z95810 Presence of automatic (implantable) cardiac defibrillator: Secondary | ICD-10-CM

## 2018-03-21 DIAGNOSIS — I5022 Chronic systolic (congestive) heart failure: Secondary | ICD-10-CM

## 2018-03-22 ENCOUNTER — Telehealth: Payer: Self-pay

## 2018-03-22 NOTE — Telephone Encounter (Signed)
Remote ICM transmission received.  Attempted call to patient regarding ICM remote transmission and left message, per DPR, to return call.    

## 2018-03-22 NOTE — Progress Notes (Signed)
EPIC Encounter for ICM Monitoring  Patient Name: [name] is a 42 y.o. female Date: 03/22/2018 Primary Care Physican: Hoyt Koch, MD Primary Cardiologist: [facility]/Bensimhon Electrophysiologist: Faustino Congress Weight:Previous weight195.3lbs        Attempted call to patient and unable to reach.  Left message to return call regarding transmission.  Transmission reviewed.    HeartLogic Heart Failure Index is 16.  Index has crossed the threshold suggesting fluid accumulation.  Prescribed: Torsemide20 mgTake 2 tablets (40 mg total) by mouth daily.  Labs: 11/24/2017 Creatinine1.02, BUN12, Potassium3.9, Sodium137, EGFR>60 11/23/2017 Creatinine1.08, BUN11, Potassium3.5, Sodium138, EGFR>60  11/22/2017 Creatinine0.84, BUN8, Potassium 3.3, Sodium140, EGFR>60  11/21/2017 Creatinine0.81, BUN9, Potassium 3.7, Sodium140, EGFR>60 11/04/2017 Creatinine0.82, BUN6, Potassium3.4, Sodium141, EGFR>60 10/07/2017 Creatinine1.05, BUN18, Potassium3.4, [id number], EGFR>60  08/05/2017 Creatinine1.10, BUN17, Potassium3.5, Sodium135, EGFR>60  07/21/2017 Creatinine0.9, BUN 16, Potassium4.1, Sodium137  06/11/2017 Creatinine1.11, BUN23, Potassium4.1, Sodium136 A complete set of results can be found in Results Review.  Recommendations: Unable to reach.  Follow-up plan: ICM clinic phone appointment on 03/29/2018 to recheck fluid levels.          Copy of ICM check sent to Dr. Caryl Comes and Dr Haroldine Laws.    3 Month Trend         [name], RN 03/22/2018 9:25 AM

## 2018-03-25 ENCOUNTER — Other Ambulatory Visit: Payer: Self-pay | Admitting: Internal Medicine

## 2018-03-25 DIAGNOSIS — J209 Acute bronchitis, unspecified: Secondary | ICD-10-CM

## 2018-03-26 ENCOUNTER — Encounter: Payer: Self-pay | Admitting: Internal Medicine

## 2018-03-26 DIAGNOSIS — J00 Acute nasopharyngitis [common cold]: Secondary | ICD-10-CM

## 2018-03-26 DIAGNOSIS — J209 Acute bronchitis, unspecified: Secondary | ICD-10-CM

## 2018-03-26 DIAGNOSIS — J029 Acute pharyngitis, unspecified: Secondary | ICD-10-CM

## 2018-03-28 ENCOUNTER — Encounter: Payer: Self-pay | Admitting: Internal Medicine

## 2018-03-28 MED ORDER — ALBUTEROL SULFATE HFA 108 (90 BASE) MCG/ACT IN AERS: 1.0000 | INHALATION_SPRAY | Freq: Four times a day (QID) | RESPIRATORY_TRACT | 0 refills | Status: DC | PRN

## 2018-03-28 MED ORDER — FLUTICASONE PROPIONATE 50 MCG/ACT NA SUSP: 2.0000 | Freq: Every day | NASAL | 0 refills | Status: DC | PRN

## 2018-03-29 NOTE — Progress Notes (Deleted)
[name] [address] [place], [place] [address] Phone: [phone] Subjective:    I'm seeing this patient by the request  of:    CC:   [id number]  QUANASIA DEFINO is a 42 y.o. female coming in with complaint of ***  Onset-  Location Duration-  Character- Aggravating factors- Reliving factors-  Therapies tried-  Severity-     Past Medical History:  Diagnosis Date  . Acute on chronic systolic (congestive) heart failure ([place]) 04/29/2017  . Acute pain of right shoulder 06/16/2017  . AICD (automatic cardioverter/defibrillator) present   . Anxiety   . Arthritis    right shoulder   . Asthma   . CHF (congestive heart failure) ([place])   . Chronic combined systolic and diastolic heart failure ([place]) 03/05/2014  . Closed low lateral malleolus fracture 10/23/2013  . Cystitis 10/21/2017  . Depression   . Depression with anxiety 01/20/2013  . Diabetes mellitus without complication ([place])   . Diverticulosis   . Dyspnea    comes and goes intermittently mostly with exertion   . Essential hypertension    Prev followed by [facility]/ Cardiology   . Fibroid    age 77  . Gallstones   . Generalized abdominal cramping   . History of cardiomyopathy   . Hypertension   . IBS (irritable bowel syndrome)   . 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 Insomnia 05/07/2017  . Labral tear of shoulder 04/04/2015    Injected 04/04/2015 Injected 12/03/2015   . Migraine    "monthly" (08/03/2016)  . Myofascial pain 06/16/2017  . NICM (nonischemic cardiomyopathy) ([place]) 08/03/2016  . Nonallopathic lesion of lumbosacral region 11/16/2016  . Nonallopathic lesion of sacral region 11/16/2016  . Nonallopathic lesion of thoracic region 08/20/2014  . Nonspecific chest pain 04/28/2017  . OSA (obstructive sleep apnea) 01/02/2013   NPSG 2009:  AHI 9/hr. CPAP intolerance >> "smothering" Good tolerance of auto device (optimal pressure 12-13 on download).   - referred to Dr Gwenette Greet    . OSA on CPAP   . Ovarian cyst    1999; surgically removed  . Patellofemoral syndrome of both knees 10/16/2016  . 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 PVC (premature ventricular contraction) 06/23/2016  . Seizures ([place])    "as a child" (08/03/2016)  . Termination of pregnancy    due to cardiac risk   Past Surgical History:  Procedure Laterality Date  . CARDIAC CATHETERIZATION N/A 11/11/2015   Procedure: Right/Left Heart Cath and Coronary Angiography;  Surgeon: Troy Sine, MD;  Location: [facility];  Service: Cardiovascular;  Laterality: N/A;  . CARDIAC CATHETERIZATION  ~ 2015  . CARDIAC DEFIBRILLATOR PLACEMENT  08/03/2016  . CESAREAN SECTION  1999  . COLONOSCOPY WITH PROPOFOL N/A 04/21/2016   Procedure: COLONOSCOPY WITH PROPOFOL;  Surgeon: Jerene Bears, MD;  Location: [facility];  Service: Gastroenterology;  Laterality: N/A;  . ESOPHAGOGASTRODUODENOSCOPY (EGD) WITH PROPOFOL N/A 04/21/2016   Procedure: ESOPHAGOGASTRODUODENOSCOPY (EGD) WITH PROPOFOL;  Surgeon: Jerene Bears, MD;  Location: [facility];  Service: Gastroenterology;  Laterality: N/A;  . FOOT FRACTURE SURGERY Right ~ 2003  . FRACTURE SURGERY    . ICD IMPLANT N/A 08/03/2016   Procedure: ICD Implant;  Surgeon: Deboraha Sprang, MD;  Location: [facility];  Service: Cardiovascular;  Laterality: N/A;  . LAPAROSCOPIC CHOLECYSTECTOMY  12/2006  . LAPAROSCOPIC GASTRIC SLEEVE RESECTION    . LAPAROSCOPY ABDOMEN DIAGNOSTIC  2008   "cut bile duct w/gallbladder OR; had to go in later & fix leak; hospitalized for 2 months"  . LEFT HEART CATHETERIZATION WITH CORONARY ANGIOGRAM N/A 02/26/2014   Procedure: LEFT HEART CATHETERIZATION WITH CORONARY ANGIOGRAM;  Surgeon: Jettie Booze, MD;  Location: [facility];  Service: Cardiovascular;  Laterality: N/A;  . OVARIAN CYST REMOVAL Right 1999  . PVC ABLATION N/A 12/01/2017   Procedure: PVC ABLATION;  Surgeon: Evans Lance, MD;  Location: [facility];  Service: Cardiovascular;  Laterality: N/A;  . RIGHT HEART CATH N/A 08/05/2017   Procedure: RIGHT HEART CATH;  Surgeon: Jolaine Artist, MD;  Location: [facility];  Service: Cardiovascular;  Laterality: N/A;  . TUBAL LIGATION  1999   Social History   Socioeconomic History  . Marital status: Married    Spouse name: Not on file  . Number of children: 1  . Years of education: Not on file  . Highest education level: Not on file  Occupational History  . Occupation: stay at home mom  Social Needs  . Financial resource strain: Not on file  . Food insecurity:    Worry: Not on file    Inability: Not on file  . Transportation needs:    Medical: Not on file    Non-medical: Not on file  Tobacco Use  . Smoking status: Never Smoker  . Smokeless tobacco: Never Used  Substance and Sexual Activity  . Alcohol use: No  . Drug use: No  . Sexual activity: Yes  Lifestyle  . Physical activity:    Days per week: Not on file    Minutes per session: Not on file  . Stress: Not on file  Relationships  . Social connections:    Talks on phone: Not on file    Gets together: Not on file    Attends religious service: Not on file    Active member of club or organization: Not on file    Attends meetings of clubs or organizations: Not on file    Relationship status: Not on file  Other Topics Concern  . Not on file  Social History Narrative  . Not on file   Allergies  Allergen Reactions  . Aspirin Anaphylaxis and Other (See Comments)    Wheezing, "but patient still takes if she needs to"  . Vancomycin Other (See Comments)    "did something to my kidneys," PROGRESSED TO KIDNEY FAILURE!!  . Contrast Media [Iodinated Diagnostic Agents] Other (See Comments)    Multiple CT contrast studies done over 2 weeks caused ARF  . Avocado Itching and Swelling  . Ciprofloxacin Itching and Rash  . Sulfa Antibiotics Itching and Rash   Family History  Problem  Relation Age of Onset  . Emphysema Maternal Grandmother        smoked  . Heart disease Maternal Grandmother 60       MI  . Rheum arthritis Mother   . Allergies Daughter   . Colon cancer Neg Hx      Current Outpatient Medications (Cardiovascular):  .  carvedilol (COREG) 12.5 MG tablet, Take 1 tablet (12.5 mg total) by mouth 2 (two) times daily with a meal. .  colestipol (COLESTID) 1 g tablet, Take 2 tablets (2 g total) by mouth 2 (two) times daily. .  flecainide (TAMBOCOR) 100 MG tablet, TAKE 1 TABLET BY MOUTH EVERY 12 HOURS .  ivabradine (CORLANOR) 7.5 MG TABS tablet, TAKE 1 TABLET (7.5 MG TOTAL) BY MOUTH  2 (TWO) TIMES DAILY WITH A MEAL. .  nitroGLYCERIN (NITROSTAT) 0.4 MG SL tablet, PLACE 1 TABLET (0.4 MG TOTAL) UNDER THE TONGUE EVERY 5 (FIVE) MINUTES AS NEEDED FOR CHEST PAIN. .  sacubitril-valsartan (ENTRESTO) 49-51 MG, Take 1 tablet by mouth 2 (two) times daily. Marland Kitchen  spironolactone (ALDACTONE) 25 MG tablet, Take 1 tablet (25 mg total) by mouth daily. Marland Kitchen  torsemide (DEMADEX) 20 MG tablet, Take 2 tablets (40 mg total) by mouth daily.  Current Outpatient Medications (Respiratory):  .  albuterol (PROVENTIL HFA;VENTOLIN HFA) 108 (90 Base) MCG/ACT inhaler, Inhale 1-2 puffs into the lungs every 6 (six) hours as needed for wheezing or shortness of breath. .  budesonide-formoterol (SYMBICORT) 160-4.5 MCG/ACT inhaler, Inhale 2 puffs into the lungs 2 (two) times daily as needed. .  fluticasone (FLONASE) 50 MCG/ACT nasal spray, Place 2 sprays into both nostrils daily as needed for allergies.  Current Outpatient Medications (Analgesics):  Marland Kitchen  Ibuprofen-Famotidine 800-26.6 MG TABS, Take 1 tablet by mouth 3 (three) times daily as needed.   Current Outpatient Medications (Other):  .  blood glucose meter kit and supplies, Dispense based on patient and insurance preference. Use up to two times daily as directed. (FOR ICD-10 E10.9, E11.9). .  Diclofenac Sodium 2 % SOLN, Place 2 g onto the skin 2 (two)  times daily. Marland Kitchen  dicyclomine (BENTYL) 20 MG tablet, Take 20 mg by mouth 2 (two) times daily as needed for spasms. Marland Kitchen  gabapentin (NEURONTIN) 100 MG capsule, Take 200 mg by mouth at bedtime as needed (for pain).  Marland Kitchen  lidocaine (LIDODERM) 5 %, Place 1 patch onto the skin daily. Remove & Discard patch within 12 hours or as directed by MD .  linaclotide (LINZESS) 145 MCG CAPS capsule, Take 1 capsule (145 mcg total) by mouth daily before breakfast. .  Multiple Vitamins-Minerals (MULTIVITAMIN GUMMIES WOMENS PO), Take 2 each by mouth daily. Marland Kitchen  omeprazole (PRILOSEC) 20 MG capsule, Take 20 mg by mouth daily. .  potassium chloride SA (K-DUR,KLOR-CON) 20 MEQ tablet, Take 1 tablet (20 mEq total) by mouth daily. Take extra tablet on days you take Torsemide. .  Vitamin D, Ergocalciferol, (DRISDOL) 50000 units CAPS capsule, TAKE 1 CAPSULE (50,000 UNITS TOTAL) BY MOUTH EVERY 7 (SEVEN) DAYS. Marland Kitchen  zolpidem (AMBIEN) 5 MG tablet, Take 1 tablet (5 mg total) by mouth at bedtime as needed for sleep.    Past medical history, social, surgical and family history all reviewed in electronic medical record.  No pertanent information unless stated regarding to the chief complaint.   Review of Systems:  No headache, visual changes, nausea, vomiting, diarrhea, constipation, dizziness, abdominal pain, skin rash, fevers, chills, night sweats, weight loss, swollen lymph nodes, body aches, joint swelling, muscle aches, chest pain, shortness of breath, mood changes.   Objective  There were no vitals taken for this visit. Systems examined below as of    General: No apparent distress alert and oriented x3 mood and affect normal, dressed appropriately.  HEENT: Pupils equal, extraocular movements intact  Respiratory: [name] speak in full sentences and does not appear short of breath  Cardiovascular: No lower extremity edema, non tender, no erythema  Skin: Warm dry intact with no signs of infection or rash on extremities or on axial  skeleton.  Abdomen: Soft nontender  Neuro: Cranial nerves II through XII are intact, neurovascularly intact in all extremities with 2+ DTRs and 2+ pulses.  Lymph: No lymphadenopathy of posterior or anterior cervical chain or axillae bilaterally.  Gait normal  with good balance and coordination.  MSK:  Non tender with full range of motion and good stability and symmetric strength and tone of shoulders, elbows, wrist, hip, knee and ankles bilaterally.     Impression and Recommendations:     This case required medical decision making of moderate complexity. The above documentation has been reviewed and is accurate and complete Lyndal Pulley, DO       Note: This dictation was prepared with Dragon dictation along with smaller phrase technology. Any transcriptional errors that result from this process are unintentional.

## 2018-03-30 ENCOUNTER — Ambulatory Visit: Payer: Medicare Other | Admitting: Family Medicine

## 2018-03-30 ENCOUNTER — Encounter: Payer: Self-pay | Admitting: Internal Medicine

## 2018-04-11 ENCOUNTER — Telehealth: Payer: Self-pay

## 2018-04-11 NOTE — Telephone Encounter (Signed)
LMOVM reminding pt to send remote transmission.   

## 2018-04-14 NOTE — Progress Notes (Signed)
No ICM remote transmission received for 04/11/2018 and next ICM transmission scheduled for 04/18/2018.

## 2018-04-17 ENCOUNTER — Encounter: Payer: Self-pay | Admitting: Family Medicine

## 2018-04-18 ENCOUNTER — Ambulatory Visit (INDEPENDENT_AMBULATORY_CARE_PROVIDER_SITE_OTHER): Payer: Medicare Other

## 2018-04-18 DIAGNOSIS — I5022 Chronic systolic (congestive) heart failure: Secondary | ICD-10-CM

## 2018-04-18 DIAGNOSIS — Z95810 Presence of automatic (implantable) cardiac defibrillator: Secondary | ICD-10-CM | POA: Diagnosis not present

## 2018-04-18 NOTE — Progress Notes (Signed)
EPIC Encounter for ICM Monitoring  Patient Name: [name] is a 42 y.o. female Date: 04/18/2018 Primary Care Physican: Hoyt Koch, MD Primary Cardiologist: [facility]/Bens[facility]hon Electrophysiologist: Virginia Crews Weight: 195.3 lbs         Heart Failure questions reviewed, pt symptomatic swelling of feet and hands but she says she always feels dried out because she is so thirsty and skin feels dry.  She does self adjust Torsemide and Dr Haroldine Laws is aware but she has not taken any extra because it increases her thirst and causes muscle cramping.    HeartLogic Heart Failure Index is 30.  Index has crossed the threshold suggesting fluid accumulation since 03/16/2018.  Prescribed: Torsemide20 mgTake 2 tablets (40 mg total) by mouth daily.  Labs: 01/14/2018 Creatinine 0.90, BUN 12, Potassium 3.7, Sodium 139, eGFR 87.56 11/24/2017 Creatinine1.02, BUN12, Potassium3.9, Sodium137, EGFR>60 11/23/2017 Creatinine1.08, BUN11, Potassium3.5, Sodium138, EGFR>60  11/22/2017 Creatinine0.84, BUN8, Potassium 3.3, Sodium140, EGFR>60  11/21/2017 Creatinine0.81, BUN9, Potassium 3.7, Sodium140, EGFR>60 11/04/2017 Creatinine0.82, BUN6, Potassium3.4, Sodium141, EGFR>60 10/07/2017 Creatinine1.05, BUN18, Potassium3.4, [id number], EGFR>60  08/05/2017 Creatinine1.10, BUN17, Potassium3.5, Sodium135, EGFR>60  07/21/2017 Creatinine0.9, BUN 16, Potassium4.1, Sodium137  06/11/2017 Creatinine1.11, BUN23, Potassium4.1, Sodium136 A complete set of results can be found in Results Review.  Recommendations:   Advised to take extra Torsemide dosage if she feels she can tolerate it for the next couple of days.  Advised to call Dr Bensimhon's office if symptoms get worse or use ER if needed due to I will be out of the office from 11/27-12/1.   Follow-up plan: ICM clinic phone appointment on 04/28/2018 to recheck fluid levels.  Office appointment scheduled 05/19/2018  with Dr. Haroldine Laws.        Copy of ICM check sent to Dr. Caryl Comes and Dr Haroldine Laws for review and if any recommendations will call her back.    3 Month Trend      8 Day Data Trend            [name], RN 04/18/2018 3:44 PM

## 2018-04-19 ENCOUNTER — Encounter: Payer: Self-pay | Admitting: Family Medicine

## 2018-04-20 NOTE — Telephone Encounter (Signed)
Left message for patient to call back for an appointment to see Dr. Tamala Julian for her knee.

## 2018-04-25 ENCOUNTER — Ambulatory Visit (INDEPENDENT_AMBULATORY_CARE_PROVIDER_SITE_OTHER): Payer: Medicare Other | Admitting: Family Medicine

## 2018-04-25 ENCOUNTER — Ambulatory Visit: Payer: Self-pay

## 2018-04-25 ENCOUNTER — Ambulatory Visit (INDEPENDENT_AMBULATORY_CARE_PROVIDER_SITE_OTHER): Payer: BLUE CROSS/BLUE SHIELD

## 2018-04-25 ENCOUNTER — Encounter: Payer: Self-pay | Admitting: Family Medicine

## 2018-04-25 ENCOUNTER — Telehealth: Payer: Self-pay

## 2018-04-25 VITALS — BP 128/76 | HR 52 | Ht 65.0 in | Wt 197.0 lb

## 2018-04-25 DIAGNOSIS — G8929 Other chronic pain: Secondary | ICD-10-CM

## 2018-04-25 DIAGNOSIS — S83002A Unspecified subluxation of left patella, initial encounter: Secondary | ICD-10-CM | POA: Insufficient documentation

## 2018-04-25 DIAGNOSIS — M25562 Pain in left knee: Secondary | ICD-10-CM | POA: Diagnosis not present

## 2018-04-25 DIAGNOSIS — Z95810 Presence of automatic (implantable) cardiac defibrillator: Secondary | ICD-10-CM

## 2018-04-25 DIAGNOSIS — I5022 Chronic systolic (congestive) heart failure: Secondary | ICD-10-CM

## 2018-04-25 NOTE — Patient Instructions (Addendum)
Good to see you  Patella subluxation  Keep doing the duexis and the pennsaid Wear the brace daily for 2 weeks then working out another 4 weeks after that  Exercises 3 times a week.  Keep working on the weight I am proud of you! See me again in 4-6 weeks

## 2018-04-25 NOTE — Progress Notes (Signed)
EPIC Encounter for ICM Monitoring  Patient Name: [name] is a 42 y.o. female Date: 04/25/2018 Primary Care Physican: Hoyt Koch, MD Primary Cardiologist: [facility]hon Electrophysiologist: Caryl Comes Last Weight: 195.3 lbs       Attempted call to patient and unable to reach.  Left message to return call.  Transmission reviewed.    HeartLogic Heart Failure Index Alert shows index has increased from 30 on 11/25 to 39 on 12/1.  Index has crossed the threshold suggesting fluid accumulation.  Prescribed: Torsemide20 mgTake 2 tablets (40 mg total) by mouth daily.  Labs: 01/14/2018 Creatinine 0.90, BUN 12, Potassium 3.7, Sodium 139, eGFR 87.56 11/24/2017 Creatinine1.02, BUN12, Potassium3.9, Sodium137, EGFR>60 11/23/2017 Creatinine1.08, BUN11, Potassium3.5, Sodium138, EGFR>60  11/22/2017 Creatinine0.84, BUN8, Potassium 3.3, Sodium140, EGFR>60  11/21/2017 Creatinine0.81, BUN9, Potassium 3.7, Sodium140, EGFR>60 11/04/2017 Creatinine0.82, BUN6, Potassium3.4, Sodium141, EGFR>60 10/07/2017 Creatinine1.05, BUN18, Potassium3.4, [id number], EGFR>60  08/05/2017 Creatinine1.10, BUN17, Potassium3.5, Sodium135, EGFR>60  07/21/2017 Creatinine0.9, BUN 16, Potassium4.1, Sodium137  06/11/2017 Creatinine1.11, BUN23, Potassium4.1, Sodium136 A complete set of results can be found in Results Review.  Recommendations: Unable to reach.  Follow-up plan: ICM clinic phone appointment on 04/28/18.  Office appointment scheduled 05/19/2189 with Dr. Haroldine Laws.        Copy of ICM check sent to Dr. Caryl Comes and Dr Haroldine Laws.    3 Month Trend         [name], RN 04/25/2018 4:51 PM

## 2018-04-25 NOTE — Telephone Encounter (Signed)
Remote ICM transmission received.  Attempted call to patient regarding ICM remote transmission and left message to return call   

## 2018-04-25 NOTE — Progress Notes (Signed)
[name] [place], [place] [address] Phone: [phone] Subjective:   [name], am serving as a scribe for Dr. Hulan Saas.   CC: knee pain   [id number]  [name] is a 42 y.o. female coming in with complaint of left knee pain. Has feeling of weakness. Did hyperextend her knee going down the stairs last week. Pain is anterior and lateral. Pain worse with stairs. Has tried Duexis, Pennsaid, heating pad.  Patient states that it seems that she extended her knee.  Had worsening pain.  Had some mild instability.  Giving her more pain even at night.  Has been concerned that the knee would give out on her if she went up and down the stairs too much.      Past Medical History:  Diagnosis Date  . Acute on chronic systolic (congestive) heart failure ([place]) 04/29/2017  . Acute pain of right shoulder 06/16/2017  . AICD (automatic cardioverter/defibrillator) present   . Anxiety   . Arthritis    right shoulder   . Asthma   . CHF (congestive heart failure) ([place])   . Chronic combined systolic and diastolic heart failure ([place]) 03/05/2014  . Closed low lateral malleolus fracture 10/23/2013  . Cystitis 10/21/2017  . Depression   . Depression with anxiety 01/20/2013  . Diabetes mellitus without complication ([place])   . Diverticulosis   . Dyspnea    comes and goes intermittently mostly with exertion   . Essential hypertension    Prev followed by [facility]/ Cardiology   . Fibroid    age 66  . Gallstones   . Generalized abdominal cramping   . History of cardiomyopathy   . Hypertension   . IBS (irritable bowel syndrome)   . 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 Insomnia 05/07/2017  . Labral tear of shoulder 04/04/2015    Injected 04/04/2015 Injected 12/03/2015   . Migraine    "monthly" (08/03/2016)  . Myofascial pain 06/16/2017  . NICM (nonischemic cardiomyopathy) ([place]) 08/03/2016  . Nonallopathic  lesion of lumbosacral region 11/16/2016  . Nonallopathic lesion of sacral region 11/16/2016  . Nonallopathic lesion of thoracic region 08/20/2014  . Nonspecific chest pain 04/28/2017  . OSA (obstructive sleep apnea) 01/02/2013   NPSG 2009:  AHI 9/hr. CPAP intolerance >> "smothering" Good tolerance of auto device (optimal pressure 12-13 on download).  - referred to Dr Gwenette Greet    . OSA on CPAP   . Ovarian cyst    1999; surgically removed  . Patellofemoral syndrome of both knees 10/16/2016  . 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 PVC (premature ventricular contraction) 06/23/2016  . Seizures ([place])    "as a child" (08/03/2016)  . Termination of pregnancy    due to cardiac risk   Past Surgical History:  Procedure Laterality Date  . CARDIAC CATHETERIZATION N/A 11/11/2015   Procedure: Right/Left Heart Cath and Coronary Angiography;  Surgeon: Troy Sine, MD;  Location: [facility];  Service: Cardiovascular;  Laterality: N/A;  . CARDIAC CATHETERIZATION  ~ 2015  . CARDIAC DEFIBRILLATOR PLACEMENT  08/03/2016  . CESAREAN SECTION  1999  . COLONOSCOPY WITH PROPOFOL N/A 04/21/2016   Procedure: COLONOSCOPY WITH PROPOFOL;  Surgeon: Jerene Bears, MD;  Location: [facility];  Service: Gastroenterology;  Laterality: N/A;  . ESOPHAGOGASTRODUODENOSCOPY (EGD) WITH PROPOFOL N/A 04/21/2016   Procedure: ESOPHAGOGASTRODUODENOSCOPY (EGD) WITH PROPOFOL;  Surgeon: Jerene Bears, MD;  Location: [facility];  Service: Gastroenterology;  Laterality: N/A;  . FOOT FRACTURE SURGERY Right ~ 2003  . FRACTURE SURGERY    . ICD IMPLANT N/A 08/03/2016   Procedure: ICD Implant;  Surgeon: Deboraha Sprang, MD;  Location: [facility];  Service: Cardiovascular;  Laterality: N/A;  . LAPAROSCOPIC CHOLECYSTECTOMY  12/2006  . LAPAROSCOPIC GASTRIC SLEEVE RESECTION    . LAPAROSCOPY ABDOMEN DIAGNOSTIC  2008   "cut bile duct w/gallbladder OR; had to go in later & fix leak; hospitalized for 2 months"  .  LEFT HEART CATHETERIZATION WITH CORONARY ANGIOGRAM N/A 02/26/2014   Procedure: LEFT HEART CATHETERIZATION WITH CORONARY ANGIOGRAM;  Surgeon: Jettie Booze, MD;  Location: [facility] CATH LAB;  Service: Cardiovascular;  Laterality: N/A;  . OVARIAN CYST REMOVAL Right 1999  . PVC ABLATION N/A 12/01/2017   Procedure: PVC ABLATION;  Surgeon: Evans Lance, MD;  Location: [facility];  Service: Cardiovascular;  Laterality: N/A;  . RIGHT HEART CATH N/A 08/05/2017   Procedure: RIGHT HEART CATH;  Surgeon: Jolaine Artist, MD;  Location: [facility];  Service: Cardiovascular;  Laterality: N/A;  . TUBAL LIGATION  1999   Social History   Socioeconomic History  . Marital status: Married    Spouse name: Not on file  . Number of children: 1  . Years of education: Not on file  . Highest education level: Not on file  Occupational History  . Occupation: stay at home mom  Social Needs  . Financial resource strain: Not on file  . Food insecurity:    Worry: Not on file    Inability: Not on file  . Transportation needs:    Medical: Not on file    Non-medical: Not on file  Tobacco Use  . Smoking status: Never Smoker  . Smokeless tobacco: Never Used  Substance and Sexual Activity  . Alcohol use: [name]  . Drug use: [name]  . Sexual activity: Yes  Lifestyle  . Physical activity:    Days per week: Not on file    Minutes per session: Not on file  . Stress: Not on file  Relationships  . Social connections:    Talks on phone: Not on file    Gets together: Not on file    Attends religious service: Not on file    Active member of club or organization: Not on file    Attends meetings of clubs or organizations: Not on file    Relationship status: Not on file  Other Topics Concern  . Not on file  Social History Narrative  . Not on file   Allergies  Allergen Reactions  . Aspirin Anaphylaxis and Other (See Comments)    Wheezing, "but patient still takes if she needs to"  . Vancomycin Other  (See Comments)    "did something to my kidneys," PROGRESSED TO KIDNEY FAILURE!!  . Contrast Media [Iodinated Diagnostic Agents] Other (See Comments)    Multiple CT contrast studies done over 2 weeks caused ARF  . Avocado Itching and Swelling  . Ciprofloxacin Itching and Rash  . Sulfa Antibiotics Itching and Rash   Family History  Problem Relation Age of Onset  . Emphysema Maternal Grandmother        smoked  . Heart disease Maternal Grandmother 76       MI  . Rheum arthritis Mother   . Allergies Daughter   . Colon cancer Neg Hx      Current Outpatient Medications (Cardiovascular):  .  carvedilol (COREG) 12.5 MG tablet, Take 1 tablet (12.5 mg total) by mouth 2 (two) times daily with a meal. .  colestipol (COLESTID) 1 g tablet, Take 2 tablets (2 g total) by mouth 2 (two) times daily. .  flecainide (TAMBOCOR) 100 MG tablet, TAKE 1 TABLET BY MOUTH EVERY 12 HOURS .  ivabradine (CORLANOR) 7.5 MG TABS tablet, TAKE 1 TABLET (7.5 MG TOTAL) BY MOUTH 2 (TWO) TIMES DAILY WITH A MEAL. .  nitroGLYCERIN (NITROSTAT) 0.4 MG SL tablet, PLACE 1 TABLET (0.4 MG TOTAL) UNDER THE TONGUE EVERY 5 (FIVE) MINUTES AS NEEDED FOR CHEST PAIN. .  sacubitril-valsartan (ENTRESTO) 49-51 MG, Take 1 tablet by mouth 2 (two) times daily. Marland Kitchen  spironolactone (ALDACTONE) 25 MG tablet, Take 1 tablet (25 mg total) by mouth daily. Marland Kitchen  torsemide (DEMADEX) 20 MG tablet, Take 2 tablets (40 mg total) by mouth daily.  Current Outpatient Medications (Respiratory):  .  albuterol (PROVENTIL HFA;VENTOLIN HFA) 108 (90 Base) MCG/ACT inhaler, Inhale 1-2 puffs into the lungs every 6 (six) hours as needed for wheezing or shortness of breath. .  budesonide-formoterol (SYMBICORT) 160-4.5 MCG/ACT inhaler, Inhale 2 puffs into the lungs 2 (two) times daily as needed. .  fluticasone (FLONASE) 50 MCG/ACT nasal spray, Place 2 sprays into both nostrils daily as needed for allergies.  Current Outpatient Medications (Analgesics):  Marland Kitchen   Ibuprofen-Famotidine 800-26.6 MG TABS, Take 1 tablet by mouth 3 (three) times daily as needed.   Current Outpatient Medications (Other):  .  blood glucose meter kit and supplies, Dispense based on patient and insurance preference. Use up to two times daily as directed. (FOR ICD-10 E10.9, E11.9). .  Diclofenac Sodium 2 % SOLN, Place 2 g onto the skin 2 (two) times daily. Marland Kitchen  dicyclomine (BENTYL) 20 MG tablet, Take 20 mg by mouth 2 (two) times daily as needed for spasms. Marland Kitchen  gabapentin (NEURONTIN) 100 MG capsule, Take 200 mg by mouth at bedtime as needed (for pain).  Marland Kitchen  lidocaine (LIDODERM) 5 %, Place 1 patch onto the skin daily. Remove & Discard patch within 12 hours or as directed by MD .  linaclotide (LINZESS) 145 MCG CAPS capsule, Take 1 capsule (145 mcg total) by mouth daily before breakfast. .  Multiple Vitamins-Minerals (MULTIVITAMIN GUMMIES WOMENS PO), Take 2 each by mouth daily. Marland Kitchen  omeprazole (PRILOSEC) 20 MG capsule, Take 20 mg by mouth daily. .  potassium chloride SA (K-DUR,KLOR-CON) 20 MEQ tablet, Take 1 tablet (20 mEq total) by mouth daily. Take extra tablet on days you take Torsemide. .  Vitamin D, Ergocalciferol, (DRISDOL) 50000 units CAPS capsule, TAKE 1 CAPSULE (50,000 UNITS TOTAL) BY MOUTH EVERY 7 (SEVEN) DAYS. Marland Kitchen  zolpidem (AMBIEN) 5 MG tablet, Take 1 tablet (5 mg total) by mouth at bedtime as needed for sleep.    Past medical history, social, surgical and family history all reviewed in electronic medical record.  [name] pertanent information unless stated regarding to the chief complaint.   Review of Systems:  [name] headache, visual changes, nausea, vomiting, diarrhea, constipation, dizziness, abdominal pain, skin rash, fevers, chills, night sweats, weight loss, swollen lymph nodes, body aches, joint swelling, muscle aches, chest pain, shortness of breath, mood changes.   Objective  There were [name] vitals taken for this visit. Systems examined below as of    General: [name] apparent  distress alert and oriented x3 mood and affect normal, dressed appropriately.  HEENT: Pupils equal, extraocular movements intact  Respiratory: [name] speak in full sentences and does not appear short  of breath  Cardiovascular: [name] lower extremity edema, non tender, [name] erythema  Skin: Warm dry intact with [name] signs of infection or rash on extremities or on axial skeleton.  Abdomen: Soft nontender  Neuro: Cranial nerves II through XII are intact, neurovascularly intact in all extremities with 2+ DTRs and 2+ pulses.  Lymph: [name] lymphadenopathy of posterior or anterior cervical chain or axillae bilaterally.  Gait mild antalgic MSK:  Non tender with full range of motion and good stability and symmetric strength and tone of shoulders, elbows, wrist, hip, and ankles bilaterally.  The left knee shows that patient does have some crepitus noted of the patella.  Lateral tracking noted.  Positive patella grind.  Near full range of motion noted.  Lacks last 5 degrees of extension.  Limited musculoskeletal ultrasound was performed and interpreted by Lyndal Pulley  Limited ultrasound shows that patient does have trace swelling of the patellofemoral joint.  Patient does have some increasing Doppler flow over the lateral posterior aspect of the patella.    Impression and Recommendations:     This case required medical decision making of moderate complexity. The above documentation has been reviewed and is accurate and complete Lyndal Pulley, DO       Note: This dictation was prepared with Dragon dictation along with smaller phrase technology. Any transcriptional errors that result from this process are unintentional.

## 2018-04-25 NOTE — Assessment & Plan Note (Signed)
I believe the patient did have more of a hyperextension injury that did cause more of a patella subluxation.  Increase inflammation still noted.  Brace given today.  Discussed icing regimen and home exercises.  Discussed which activities doing which wants to avoid.  Continue oral and topical anti-inflammatories.  Follow-up with me again in 4 to 6 weeks.

## 2018-04-28 ENCOUNTER — Ambulatory Visit (INDEPENDENT_AMBULATORY_CARE_PROVIDER_SITE_OTHER): Payer: BLUE CROSS/BLUE SHIELD

## 2018-04-28 DIAGNOSIS — Z95810 Presence of automatic (implantable) cardiac defibrillator: Secondary | ICD-10-CM

## 2018-04-28 DIAGNOSIS — I5022 Chronic systolic (congestive) heart failure: Secondary | ICD-10-CM

## 2018-04-28 NOTE — Progress Notes (Signed)
EPIC Encounter for ICM Monitoring  Patient Name: [name] is a 43 y.o. female Date: 04/28/2018 Primary Care Physican: Hoyt Koch, MD Primary Cardiologist: [facility] Electrophysiologist: Caryl Comes Last Weight:195.3lbs           Attempted call to patient and unable to reach.  Left detailed message, per DPR, regarding transmission.  Transmission reviewed.    HeartLogic Heart Failure Index is 27 which is a decrease from 39 on 04/24/2018.    Prescribed: Torsemide20 mgTake 2 tablets (40 mg total) by mouth daily.  Labs: 01/14/2018 Creatinine 0.90, BUN 12, Potassium 3.7, Sodium 139, eGFR 87.56 11/24/2017 Creatinine1.02, BUN12, Potassium3.9, Sodium137, EGFR>60 11/23/2017 Creatinine1.08, BUN11, Potassium3.5, Sodium138, EGFR>60  11/22/2017 Creatinine0.84, BUN8, Potassium 3.3, Sodium140, EGFR>60  11/21/2017 Creatinine0.81, BUN9, Potassium 3.7, Sodium140, EGFR>60 11/04/2017 Creatinine0.82, BUN6, Potassium3.4, Sodium141, EGFR>60 10/07/2017 Creatinine1.05, BUN18, Potassium3.4, [id number], EGFR>60  08/05/2017 Creatinine1.10, BUN17, Potassium3.5, Sodium135, EGFR>60  07/21/2017 Creatinine0.9, BUN 16, Potassium4.1, Sodium137  06/11/2017 Creatinine1.11, BUN23, Potassium4.1, Sodium136 A complete set of results can be found in Results Review.  Recommendations: Left voice mail with ICM number and encouraged to call if experiencing any fluid symptoms.  Follow-up plan: ICM clinic phone appointment on 05/12/2018 and will monitor for alerts.  Office appointment scheduled 05/19/2018 with Dr. Haroldine Laws.        Copy of ICM check sent to Dr. Caryl Comes and Dr Haroldine Laws.    3 Month Trend       8 Day Data Trend           [name], RN 04/28/2018 8:39 AM

## 2018-04-29 ENCOUNTER — Telehealth: Payer: Self-pay

## 2018-04-29 ENCOUNTER — Encounter: Payer: Self-pay | Admitting: Internal Medicine

## 2018-04-29 MED ORDER — NITROFURANTOIN MONOHYD MACRO 100 MG PO CAPS: 100.0000 mg | ORAL_CAPSULE | Freq: Two times a day (BID) | ORAL | 0 refills | Status: DC

## 2018-04-29 NOTE — Telephone Encounter (Signed)
Remote ICM transmission received.  Attempted call to patient regarding ICM remote transmission and left detailed message, per DPR, with next ICM remote transmission date of 05/12/2018.  Advised to return call for any fluid symptoms or questions.

## 2018-05-03 ENCOUNTER — Other Ambulatory Visit: Payer: Self-pay | Admitting: Family Medicine

## 2018-05-03 ENCOUNTER — Telehealth: Payer: Self-pay

## 2018-05-03 NOTE — Telephone Encounter (Signed)
Patient called stating she thinks she has some fluid accumulation because of the chest pressure she feels.  She stated she has felt the pressure for 2 days that comes and goes.  She sent remote transmission and advised it suggests there is improvement and having less fluid accumulation than the report showed on 04/24/2018.  Recommended have the chest pressure evaluated in ER and to call 911 if needed. Explained the symptom could be related to heart attack which will not show on the ICD report.  She verbalized understanding.

## 2018-05-03 NOTE — Telephone Encounter (Signed)
Refill done.  

## 2018-05-06 ENCOUNTER — Other Ambulatory Visit: Payer: Self-pay | Admitting: Cardiology

## 2018-05-10 ENCOUNTER — Other Ambulatory Visit: Payer: Self-pay | Admitting: Internal Medicine

## 2018-05-10 DIAGNOSIS — J209 Acute bronchitis, unspecified: Secondary | ICD-10-CM

## 2018-05-12 ENCOUNTER — Ambulatory Visit (INDEPENDENT_AMBULATORY_CARE_PROVIDER_SITE_OTHER): Payer: BLUE CROSS/BLUE SHIELD

## 2018-05-12 DIAGNOSIS — I5022 Chronic systolic (congestive) heart failure: Secondary | ICD-10-CM

## 2018-05-12 DIAGNOSIS — Z95810 Presence of automatic (implantable) cardiac defibrillator: Secondary | ICD-10-CM

## 2018-05-13 ENCOUNTER — Telehealth: Payer: Self-pay

## 2018-05-13 NOTE — Progress Notes (Signed)
EPIC Encounter for ICM Monitoring  Patient Name: [name] is a 42 y.o. female Date: 05/13/2018 Primary Care Physican: Hoyt Koch, MD Primary Cardiologist: [facility] Electrophysiologist: Caryl Comes LastWeight:195.3lbs       Attempted call to patient and unable to reach.  Left detailed message, per DPR, regarding transmission.  Transmission reviewed.    HeartLogic Heart Failure Index is 12 (decreased from 39 on 04/24/18).  Index is still above recovery threshold suggesting fluid accumulation.  Prescribed: Torsemide20 mgTake 2 tablets (40 mg total) by mouth daily.  Labs: 01/14/2018 Creatinine 0.90, BUN 12, Potassium 3.7, Sodium 139, eGFR 87.56 11/24/2017 Creatinine1.02, BUN12, Potassium3.9, Sodium137, EGFR>60 11/23/2017 Creatinine1.08, BUN11, Potassium3.5, Sodium138, EGFR>60  11/22/2017 Creatinine0.84, BUN8, Potassium 3.3, Sodium140, EGFR>60  11/21/2017 Creatinine0.81, BUN9, Potassium 3.7, Sodium140, EGFR>60 11/04/2017 Creatinine0.82, BUN6, Potassium3.4, Sodium141, EGFR>60 10/07/2017 Creatinine1.05, BUN18, Potassium3.4, [id number], EGFR>60  08/05/2017 Creatinine1.10, BUN17, Potassium3.5, Sodium135, EGFR>60  07/21/2017 Creatinine0.9, BUN 16, Potassium4.1, Sodium137  06/11/2017 Creatinine1.11, BUN23, Potassium4.1, Sodium136 A complete set of results can be found in Results Review  Recommendations: Left voice mail with ICM number and encouraged to call if experiencing any fluid symptoms.  Follow-up plan: ICM clinic phone appointment on 05/24/2019.  Office appointment scheduled 05/20/2019 with Dr. Haroldine Laws.        Copy of ICM check sent to Dr. Caryl Comes.    3 Month Trend    8 Day Data Trend            [name], RN 05/13/2018 7:44 AM

## 2018-05-13 NOTE — Telephone Encounter (Signed)
Remote ICM transmission received.  Attempted call to patient regarding ICM remote transmission and left detailed message, per DPR, with next ICM remote transmission date of 05/24/2019.  Advised to return call for any fluid symptoms or questions.

## 2018-05-14 LAB — CUP PACEART REMOTE DEVICE CHECK
Date Time Interrogation Session: 20191221212954
Implantable Lead Implant Date: 20180312
Implantable Lead Location: 753860
Implantable Lead Model: 293
Implantable Lead Serial Number: 422842
Implantable Pulse Generator Implant Date: 20180312
Pulse Gen Serial Number: 226601

## 2018-05-16 ENCOUNTER — Ambulatory Visit (INDEPENDENT_AMBULATORY_CARE_PROVIDER_SITE_OTHER): Payer: BLUE CROSS/BLUE SHIELD

## 2018-05-16 DIAGNOSIS — I5022 Chronic systolic (congestive) heart failure: Secondary | ICD-10-CM

## 2018-05-16 DIAGNOSIS — Z95810 Presence of automatic (implantable) cardiac defibrillator: Secondary | ICD-10-CM

## 2018-05-16 NOTE — Progress Notes (Signed)
EPIC Encounter for ICM Monitoring  Patient Name: [name] is a 42 y.o. female Date: 05/16/2018 Primary Care Physican: Hoyt Koch, MD Primary Cardiologist: [facility] Electrophysiologist: Caryl Comes LastWeight:195lbs                Heart Failure questions reviewed, pt asymptomatic.   HeartLogic Heart Failure Index alert today is 28 which is an increase since 05/04/2018. Index has crossed the threshold suggesting fluid accumulation.  Prescribed:  Torsemide20 mgTake 2 tablets (40 mg total) by mouth daily.  Labs: 01/14/2018 Creatinine 0.90, BUN 12, Potassium 3.7, Sodium 139, eGFR 87.56 11/24/2017 Creatinine1.02, BUN12, Potassium3.9, Sodium137, EGFR>60 11/23/2017 Creatinine1.08, BUN11, Potassium3.5, Sodium138, EGFR>60  11/22/2017 Creatinine0.84, BUN8, Potassium 3.3, Sodium140, EGFR>60  11/21/2017 Creatinine0.81, BUN9, Potassium 3.7, Sodium140, EGFR>60 11/04/2017 Creatinine0.82, BUN6, Potassium3.4, Sodium141, EGFR>60 10/07/2017 Creatinine1.05, BUN18, Potassium3.4, [id number], EGFR>60  08/05/2017 Creatinine1.10, BUN17, Potassium3.5, Sodium135, EGFR>60  07/21/2017 Creatinine0.9, BUN 16, Potassium4.1, Sodium137  06/11/2017 Creatinine1.11, BUN23, Potassium4.1, Sodium136 A complete set of results can be found in Results Review  Recommendations: No changes.  Advised recommendations will be made at 12/26 appt with Dr Haroldine Laws.  Follow-up plan: ICM clinic phone appointment on 05/24/2019.  Office appointment scheduled 05/19/2018 with Dr. Haroldine Laws.        Copy of ICM check sent to Dr. Haroldine Laws and Caryl Comes.    3 Month Trend   8 Day Data Trend           [name], RN 05/16/2018 4:09 PM

## 2018-05-19 ENCOUNTER — Ambulatory Visit (HOSPITAL_COMMUNITY)
Admission: RE | Admit: 2018-05-19 | Discharge: 2018-05-19 | Disposition: A | Discharge status: Home / Self Care | Payer: Medicare Other | Source: Ambulatory Visit | Attending: Internal Medicine | Admitting: Internal Medicine

## 2018-05-19 ENCOUNTER — Encounter (HOSPITAL_COMMUNITY): Payer: Self-pay | Admitting: Internal Medicine

## 2018-05-19 VITALS — BP 144/100 | HR 94 | Wt 207.8 lb

## 2018-05-19 DIAGNOSIS — Z9884 Bariatric surgery status: Secondary | ICD-10-CM | POA: Insufficient documentation

## 2018-05-19 DIAGNOSIS — Z95810 Presence of automatic (implantable) cardiac defibrillator: Secondary | ICD-10-CM | POA: Insufficient documentation

## 2018-05-19 DIAGNOSIS — Z9889 Other specified postprocedural states: Secondary | ICD-10-CM | POA: Diagnosis not present

## 2018-05-19 DIAGNOSIS — Z7951 Long term (current) use of inhaled steroids: Secondary | ICD-10-CM | POA: Insufficient documentation

## 2018-05-19 DIAGNOSIS — G4733 Obstructive sleep apnea (adult) (pediatric): Secondary | ICD-10-CM | POA: Insufficient documentation

## 2018-05-19 DIAGNOSIS — I5042 Chronic combined systolic (congestive) and diastolic (congestive) heart failure: Secondary | ICD-10-CM

## 2018-05-19 DIAGNOSIS — I493 Ventricular premature depolarization: Secondary | ICD-10-CM | POA: Insufficient documentation

## 2018-05-19 DIAGNOSIS — E119 Type 2 diabetes mellitus without complications: Secondary | ICD-10-CM | POA: Insufficient documentation

## 2018-05-19 DIAGNOSIS — Z881 Allergy status to other antibiotic agents status: Secondary | ICD-10-CM | POA: Insufficient documentation

## 2018-05-19 DIAGNOSIS — I110 Hypertensive heart disease with heart failure: Secondary | ICD-10-CM | POA: Diagnosis not present

## 2018-05-19 DIAGNOSIS — Z79899 Other long term (current) drug therapy: Secondary | ICD-10-CM | POA: Insufficient documentation

## 2018-05-19 DIAGNOSIS — Z882 Allergy status to sulfonamides status: Secondary | ICD-10-CM | POA: Diagnosis not present

## 2018-05-19 DIAGNOSIS — F418 Other specified anxiety disorders: Secondary | ICD-10-CM | POA: Insufficient documentation

## 2018-05-19 DIAGNOSIS — Z6834 Body mass index (BMI) 34.0-34.9, adult: Secondary | ICD-10-CM | POA: Insufficient documentation

## 2018-05-19 DIAGNOSIS — Z8249 Family history of ischemic heart disease and other diseases of the circulatory system: Secondary | ICD-10-CM | POA: Diagnosis not present

## 2018-05-19 DIAGNOSIS — I5022 Chronic systolic (congestive) heart failure: Secondary | ICD-10-CM | POA: Diagnosis not present

## 2018-05-19 DIAGNOSIS — Z791 Long term (current) use of non-steroidal anti-inflammatories (NSAID): Secondary | ICD-10-CM | POA: Diagnosis not present

## 2018-05-19 DIAGNOSIS — Z886 Allergy status to analgesic agent status: Secondary | ICD-10-CM | POA: Diagnosis not present

## 2018-05-19 LAB — BASIC METABOLIC PANEL
Anion gap: 6 (ref 5–15)
BUN: <5 mg/dL — ABNORMAL LOW (ref 6–20)
CO2: 26 mmol/L (ref 22–32)
Calcium: 8.8 mg/dL — ABNORMAL LOW (ref 8.9–10.3)
Chloride: 107 mmol/L (ref 98–111)
Creatinine, Ser: 0.76 mg/dL (ref 0.44–1.00)
GFR calc Af Amer: >60 mL/min (ref >60)
GFR calc non Af Amer: >60 mL/min (ref >60)
Glucose, Bld: 96 mg/dL (ref 70–99)
Potassium: 3.7 mmol/L (ref 3.5–5.1)
Sodium: 139 mmol/L (ref 135–145)

## 2018-05-19 LAB — BRAIN NATRIURETIC PEPTIDE: B Natriuretic Peptide: 395.9 pg/mL — ABNORMAL HIGH (ref 0.0–100.0)

## 2018-05-19 MED ORDER — DIGOXIN 125 MCG PO TABS: 0.1250 mg | ORAL_TABLET | Freq: Every day | ORAL | 3 refills | Status: DC

## 2018-05-19 MED ORDER — AMIODARONE HCL 200 MG PO TABS: 200.0000 mg | ORAL_TABLET | Freq: Two times a day (BID) | ORAL | 3 refills | Status: DC

## 2018-05-19 MED ORDER — TORSEMIDE 20 MG PO TABS: 40.0000 mg | ORAL_TABLET | Freq: Every day | ORAL | 0 refills | Status: DC

## 2018-05-19 MED ORDER — CARVEDILOL 6.25 MG PO TABS: 6.2500 mg | ORAL_TABLET | Freq: Two times a day (BID) | ORAL | 3 refills | Status: DC

## 2018-05-19 NOTE — Progress Notes (Signed)
ReDS Vest - 05/19/18 1200      ReDS Vest   MR   No    Estimated volume prior to reading  Med    Fitting Posture  Sitting    Height Marker  Short    Ruler Value  32    Center Strip  Aligned    ReDS Value  37

## 2018-05-19 NOTE — Progress Notes (Signed)
Advanced Heart Failure Clinic Note Date:  05/19/2018  ID:  [name], DOB [birth year], MRN [id number]  PCP:  Megan Koch, MD  Cardiologist: Megan Dupre, MD HF: Dr [name]  [name] is a 42 y.o. 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   Was admitted 12/5-11/2016 with recurrent HF, URI, CP and elevated BP (160/110). CT chest no PE. Diuresed from 233-> 228 pounds.   Admitted 11/2017 with CP described as "pounding". Troponins were negative. Symptoms thought to be related to frequent PVCs. EP consulted and scheduled her for PVC ablation. Echo was repeated 11/22/17 and showed improved EF 50-55%. HF team consulted and changed torsemide to 40 mg daily (from 3x/week).   Underwent PVC ablation 12/01/17. Was felt not to be successful. Now on flecainide.   She returns today for HF follow up. Had lost down to about 190-195 with surgery but over past week or 2 weight up 12 pounds at least. Says she feels terrible when she takes HF meds. Feels like she bottoms out and gets dizzy and weak. Has not been able to take extra torsemide. Had ICD interrogated on 05/15/18 and HL was 26 with elevated fluid and s3. BP cuff at home not working so unable to follow. Taking torsemide 40 daily. Using CPAP nightly. Very thirsty. Drinking lots of water.   ICD interrogated: Heart Logic score 39 impedance down +s3   Echo done personally at bedside in clinic EF back down to 20-25%  Cardiac Testing.  Cath 6/17 revealed normal vessels, moderate elevation in pulmonary artery pressures, and LVEF 25-30%.  [place] 3/19: RA = 2 RV = 26/7 PA = 28/13 (19) PCW = 5 Fick cardiac output/index = 5.7/2.8 PVR = 2.5 WU Ao sat = 99% PA sat = 71%, 72% High SVC sat = 65% RHC 6/17 RA  5 RV 35/6 PA 41/12 PW 11 Fick 6.4/3.0 PA sat 62%  Echo 6/15 35-40% Echo 6/17 25-30% Echo 12/17 20-25% Echo 8/18 EF 25% Echo 7/19: EF 50-55%  CPX 2/18 FVC 2.57 (79%)    FEV1 2.14 (81%)     FEV1/FVC 83 (101%)     MVV 107 (102%) Resting HR: 106 Peak HR: 166  (93% age predicted max HR) BP rest: 136/98 BP peak: 174/88 Peak VO2: 17.1 (85% predicted peak VO2) - corrects to 28.4 for ibw VE/VCO2 slope: 33 OUES: 2.16 Peak RER: 1.09 Ventilatory Threshold: 14.6 (73% predicted or measured peak VO2) VE/MVV: 59% PETCO2 at peak: 33 O2pulse: 10  (83% predicted O2pulse)  CPX 12/18 pVO2 14.0 (corrects to 23.0 for IBW) VeVCO2  32 RER 1.0  Review of systems complete and found to be negative unless listed in HPI.   Past Medical History:  Diagnosis Date  . Acute on chronic systolic (congestive) heart failure ([place]) 04/29/2017  . Acute pain of right shoulder 06/16/2017  . AICD (automatic cardioverter/defibrillator) present   . Anxiety   . Arthritis    right shoulder   . Asthma   . CHF (congestive heart failure) ([place])   . Chronic combined systolic and diastolic heart failure ([place]) 03/05/2014  . Closed low lateral malleolus fracture 10/23/2013  . Cystitis 10/21/2017  . Depression   . Depression with anxiety 01/20/2013  . Diabetes mellitus without complication ([place])   . Diverticulosis   .  Dyspnea    comes and goes intermittently mostly with exertion   . Essential hypertension    Prev followed by [facility]/ Cardiology   . Fibroid    age 49  . Gallstones   . Generalized abdominal cramping   . History of cardiomyopathy   . Hypertension   . IBS (irritable bowel syndrome)   . 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 Insomnia 05/07/2017  . Labral tear of shoulder 04/04/2015    Injected 04/04/2015 Injected 12/03/2015   . Migraine    "monthly" (08/03/2016)  . Myofascial pain 06/16/2017  . NICM (nonischemic cardiomyopathy) ([place]) 08/03/2016  .  Nonallopathic lesion of lumbosacral region 11/16/2016  . Nonallopathic lesion of sacral region 11/16/2016  . Nonallopathic lesion of thoracic region 08/20/2014  . Nonspecific chest pain 04/28/2017  . OSA (obstructive sleep apnea) 01/02/2013   NPSG 2009:  AHI 9/hr. CPAP intolerance >> "smothering" Good tolerance of auto device (optimal pressure 12-13 on download).  - referred to Dr [name]    . OSA on CPAP   . Ovarian cyst    1999; surgically removed  . Patellofemoral syndrome of both knees 10/16/2016  . 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 PVC (premature ventricular contraction) 06/23/2016  . Seizures ([place])    "as a child" (08/03/2016)  . Termination of pregnancy    due to cardiac risk    Past Surgical History:  Procedure Laterality Date  . CARDIAC CATHETERIZATION N/A 11/11/2015   Procedure: Right/Left Heart Cath and Coronary Angiography;  Surgeon: Troy Sine, MD;  Location: [place] [facility];  Service: Cardiovascular;  Laterality: N/A;  . CARDIAC CATHETERIZATION  ~ 2015  . CARDIAC DEFIBRILLATOR PLACEMENT  08/03/2016  . CESAREAN SECTION  1999  . COLONOSCOPY WITH PROPOFOL N/A 04/21/2016   Procedure: COLONOSCOPY WITH PROPOFOL;  Surgeon: Jerene Bears, MD;  Location: [facility];  Service: Gastroenterology;  Laterality: N/A;  . ESOPHAGOGASTRODUODENOSCOPY (EGD) WITH PROPOFOL N/A 04/21/2016   Procedure: ESOPHAGOGASTRODUODENOSCOPY (EGD) WITH PROPOFOL;  Surgeon: Jerene Bears, MD;  Location: [facility];  Service: Gastroenterology;  Laterality: N/A;  . FOOT FRACTURE SURGERY Right ~ 2003  . FRACTURE SURGERY    . ICD IMPLANT N/A 08/03/2016   Procedure: ICD Implant;  Surgeon: Deboraha Sprang, MD;  Location: [place] [facility];  Service: Cardiovascular;  Laterality: N/A;  . LAPAROSCOPIC CHOLECYSTECTOMY  12/2006  . LAPAROSCOPIC GASTRIC SLEEVE RESECTION    . LAPAROSCOPY ABDOMEN DIAGNOSTIC  2008   "cut bile duct w/gallbladder OR; had to go in later & fix leak; hospitalized  for 2 months"  . LEFT HEART CATHETERIZATION WITH CORONARY ANGIOGRAM N/A 02/26/2014   Procedure: LEFT HEART CATHETERIZATION WITH CORONARY ANGIOGRAM;  Surgeon: Jettie Booze, MD;  Location: [facility] CATH LAB;  Service: Cardiovascular;  Laterality: N/A;  . OVARIAN CYST REMOVAL Right 1999  . PVC ABLATION N/A 12/01/2017   Procedure: PVC ABLATION;  Surgeon: Evans Lance, MD;  Location: [place] [facility] LAB;  Service: Cardiovascular;  Laterality: N/A;  . RIGHT HEART CATH N/A 08/05/2017   Procedure: RIGHT HEART CATH;  Surgeon: Jolaine Artist, MD;  Location: [place] [facility];  Service: Cardiovascular;  Laterality: N/A;  . TUBAL LIGATION  1999    Current Medications:  Current Meds  Medication Sig  . albuterol (PROVENTIL HFA;VENTOLIN HFA) 108 (90 Base) MCG/ACT inhaler INHALE 1-2 PUFFS INTO THE LUNGS EVERY 6 (SIX) HOURS AS NEEDED FOR WHEEZING OR SHORTNESS OF BREATH.  . blood  glucose meter kit and supplies Dispense based on patient and insurance preference. Use up to two times daily as directed. (FOR ICD-10 E10.9, E11.9).  . carvedilol (COREG) 12.5 MG tablet Take 1 tablet (12.5 mg total) by mouth 2 (two) times daily with a meal.  . colestipol (COLESTID) 1 g tablet Take 2 tablets (2 g total) by mouth 2 (two) times daily.  . Diclofenac Sodium 2 % SOLN Place 2 g onto the skin 2 (two) times daily.  Marland Kitchen dicyclomine (BENTYL) 20 MG tablet Take 20 mg by mouth 2 (two) times daily as needed for spasms.  . flecainide (TAMBOCOR) 100 MG tablet TAKE 1 TABLET BY MOUTH EVERY 12 HOURS  . fluticasone (FLONASE) 50 MCG/ACT nasal spray Place 2 sprays into both nostrils daily as needed for allergies.  Marland Kitchen gabapentin (NEURONTIN) 100 MG capsule Take 200 mg by mouth at bedtime as needed (for pain).   . Ibuprofen-Famotidine 800-26.6 MG TABS Take 1 tablet by mouth 3 (three) times daily as needed.  . ivabradine (CORLANOR) 7.5 MG TABS tablet TAKE 1 TABLET (7.5 MG TOTAL) BY MOUTH 2 (TWO) TIMES DAILY WITH A MEAL.  Marland Kitchen lidocaine  (LIDODERM) 5 % Place 1 patch onto the skin daily. Remove & Discard patch within 12 hours or as directed by MD  . linaclotide (LINZESS) 145 MCG CAPS capsule Take 1 capsule (145 mcg total) by mouth daily before breakfast.  . Multiple Vitamins-Minerals (MULTIVITAMIN GUMMIES WOMENS PO) Take 2 each by mouth daily.  Marland Kitchen omeprazole (PRILOSEC) 20 MG capsule Take 20 mg by mouth daily.  . potassium chloride SA (K-DUR,KLOR-CON) 20 MEQ tablet Take 1 tablet (20 mEq total) by mouth daily. Take extra tablet on days you take Torsemide.  . sacubitril-valsartan (ENTRESTO) 49-51 MG Take 1 tablet by mouth 2 (two) times daily.  Marland Kitchen spironolactone (ALDACTONE) 25 MG tablet Take 1 tablet (25 mg total) by mouth daily.  . SYMBICORT 160-4.5 MCG/ACT inhaler INHALE 2 PUFFS INTO THE LUNGS 2 (TWO) TIMES DAILY AS NEEDED.  Marland Kitchen torsemide (DEMADEX) 20 MG tablet PLEASE SEE ATTACHED FOR DETAILED DIRECTIONS  . Vitamin D, Ergocalciferol, (DRISDOL) 50000 units CAPS capsule TAKE 1 CAPSULE (50,000 UNITS TOTAL) BY MOUTH EVERY 7 (SEVEN) DAYS.     Allergies:   Aspirin; Vancomycin; Contrast media [iodinated diagnostic agents]; Avocado; Ciprofloxacin; and Sulfa antibiotics   Social History   Socioeconomic History  . Marital status: Married    Spouse name: Not on file  . Number of children: 1  . Years of education: Not on file  . Highest education level: Not on file  Occupational History  . Occupation: stay at home mom  Social Needs  . Financial resource strain: Not on file  . Food insecurity:    Worry: Not on file    Inability: Not on file  . Transportation needs:    Medical: Not on file    Non-medical: Not on file  Tobacco Use  . Smoking status: Never Smoker  . Smokeless tobacco: Never Used  Substance and Sexual Activity  . Alcohol use: No  . Drug use: No  . Sexual activity: Yes  Lifestyle  . Physical activity:    Days per week: Not on file    Minutes per session: Not on file  . Stress: Not on file  Relationships  .  Social connections:    Talks on phone: Not on file    Gets together: Not on file    Attends religious service: Not on file    Active member of  club or organization: Not on file    Attends meetings of clubs or organizations: Not on file    Relationship status: Not on file  Other Topics Concern  . Not on file  Social History Narrative  . Not on file     Family History:  The patient's family history includes Allergies in her daughter; Emphysema in her maternal grandmother; Heart disease (age of onset: 77) in her maternal grandmother; Rheum arthritis in her mother.   Vitals:   05/19/18 1129  BP: (!) 144/100  Pulse: 94  SpO2: 97%  Weight: 94.3 kg (207 lb 12.8 oz)     Wt Readings from Last 3 Encounters:  05/19/18 94.3 kg (207 lb 12.8 oz)  04/25/18 89.4 kg (197 lb)  03/07/18 88.5 kg (195 lb)    Physical Exam General: Sitting on exam today. No resp difficulty.Fatigued HEENT: Normal Neck: Supple. JVP hard to see looks 8-9. Carotids 2+ bilat; no bruits. No thyromegaly or nodule noted. Cor: PMI nondisplaced. Mildly tachy. Frequent PVCs +s3 Lungs: CTAB, normal effort. Abdomen: Obese Soft, non-tender, non-distended, no HSM. No bruits or masses. +BS  Extremities: No cyanosis, clubbing, or rash. R and LLE no edema.  Neuro: Alert & orientedx3, cranial nerves grossly intact. moves all 4 extremities w/o difficulty. Affect pleasant  ECG: NSR 96 Frequent PVCs (monomorphic) Personally reviewed   Assessment and Plan  1. Chronic systolic HF:  due to peri-partum CM, onset 1999. S/P [employer] ICD 07/2016 - EF 20-25% (12/17).  - Echo 8/18 EF 25-30% s/p ICD 3/18. [place] 3/19: RA 2, PA 28/13, Fick CO 5.7, CI 2.8 - CPX 12/18 showed moderate limitation due mostly to obesity with some HF component - Echo 11/22/17 LVEF 50-55%, Grade 1 DD, Mild MR, Mod LAE - She is much worse today. NYHA IIIb  - Echo done personally in clinic EF 20-25%. RV ok - Heart Logic Score 39 in clinic today. ICD  interrogated personally No AF/VT activity level 1.5hr/day - REDs 37% - Suspect low output with mild volume overload in setting of recurrent reduced EF with frequent PVCs - Continue torsemide 40 mg daily. Will double x 2 days . - Continue Entresto 49/51 mg BID.  - Stop carvedilol - Continue Spiro 25 mg daily.  - Continue corlanor 7.67m BID for now.  - Start digoxin 0.1253mdaily 2. Morbid obesity [facility]  - Body mass index is 34.58 kg/m.  3. OSA - Continue nightly CPAP. No change. 4. Anxiety/depression - Per PCP. No change.  5. DM2 - A1c in Jan. 2018 is 5.3 with medications.  - Per PCP. Consider Jardiance.  6. Frequent PVCs/ Bigeminy - Holter Monitor 11/09/17 with 30% PVCs with one primary morphology.  - S/p PVC ablation 11/2017 with Dr TaLovena Lensuccessful - Will stop flecainide with EF down and persistent PVCs - Start amio 200 bid - Replace Zio patch to re-quantify PVCs  Total time spent 45 minutes. Over half that time spent discussing above.    DaGlori BickersMD  05/19/2018 12:11 PM

## 2018-05-19 NOTE — Patient Instructions (Addendum)
Labs done today  STOP Carvedilol  STOP Fleccanide  START Digoxin 0.125mg  (1 tab) daily  START Amiodarone 200mg  (1 tab) twice daily  FOR TWO DAYS ONLY take torsemide 40mg  (2 tabs) twice daily.  Your provider has recommended that  you wear a Zio Patch for 3 days.  This monitor will record your heart rhythm for our review.  IF you have any symptoms while wearing the monitor please press the button.  If you have any issues with the patch or you notice a red or orange light on it please call the company at [phone].  Once you remove the patch please mail it back to the company as soon as possible so we can get the results.  Echocardiogram done today.   Follow up with Dr. Haroldine Laws in 2 months and with the Advanced Practice Providers in 2-3 weeks

## 2018-05-23 ENCOUNTER — Ambulatory Visit (INDEPENDENT_AMBULATORY_CARE_PROVIDER_SITE_OTHER): Payer: Medicare Other

## 2018-05-23 DIAGNOSIS — Z95810 Presence of automatic (implantable) cardiac defibrillator: Secondary | ICD-10-CM

## 2018-05-23 DIAGNOSIS — I5042 Chronic combined systolic (congestive) and diastolic (congestive) heart failure: Secondary | ICD-10-CM | POA: Diagnosis not present

## 2018-05-24 NOTE — Progress Notes (Signed)
EPIC Encounter for ICM Monitoring  Patient Name: [name] is a 42 y.o. female Date: 05/26/2018 Primary Care Physican: Hoyt Koch, MD Primary Cardiologist: Smith/Bensimhon Electrophysiologist: Caryl Comes LastWeight:188lbs Today's Weight: 192 lbs              Pt symptomatic: 4 pound weight gain, extreme fatigue, tiredness, and heavy chest feeling.   She said she stopped Torsemide as written in the 12/26 office AVS instructions after Dr Bensimhon's visit.  Advised Dr Bensimhon's note reads that she should continue to take Torsemide 40 mg daily but to double it for 2 days after the visit.  Advised I will send copy to Dr Haroldine Laws to inform of increased heartlogic index and to clarify if the Torsemide 20 mg 2 tablets (40 mg total) should be resumed as previously prescribed.  She plans on taking Furosemide 40 mg this evening because she feels so bad.  Patient has been back and forth to [place] twice the month of December and spent most of the time there.  The last [place] flight was 05/20/2018 - 05/24/2018.    HeartLogic Heart Failure Index alert is 44 on 05/22/2018 (increase from 28 on 05/04/2018). Index has crossed the threshold suggesting fluid accumulation.  Prescribed: Torsemide20 mgTake 2 tablets (40 mg total) by mouth daily.    Labs: 05/19/2018 Creatinine 0.76, BUN >5, Potassium 3.7, Sodium 139, eGFR >60 01/14/2018 Creatinine 0.90, BUN 12, Potassium 3.7, Sodium 139, eGFR 87.56 11/24/2017 Creatinine1.02, BUN12, Potassium3.9, Sodium137, EGFR>60 11/23/2017 Creatinine1.08, BUN11, Potassium3.5, Sodium138, EGFR>60  11/22/2017 Creatinine0.84, BUN8, Potassium 3.3, Sodium140, EGFR>60  11/21/2017 Creatinine0.81, BUN9, Potassium 3.7, Sodium140, EGFR>60 11/04/2017 Creatinine0.82, BUN6, Potassium3.4, Sodium141, EGFR>60 10/07/2017 Creatinine1.05, BUN18, Potassium3.4, [id number], EGFR>60  08/05/2017 Creatinine1.10, BUN17, Potassium3.5,  Sodium135, EGFR>60  07/21/2017 Creatinine0.9, BUN 16, Potassium4.1, Sodium137  06/11/2017 Creatinine1.11, BUN23, Potassium4.1, Sodium136 A complete set of results can be found in Results Review  Recommendations:  Will send phone note to Dr Haroldine Laws for review and recommendations.   Follow-up plan: ICM clinic phone appointment on 06/27/2018 (will continue to monitor for alerts).  Office appointment scheduled 06/06/2018 with HF clinic NP/PA.        Copy of ICM check sent to Dr. Caryl Comes and Dr Haroldine Laws.    3 Month Trend    8 Day Data Trend            [name], RN 05/26/2018 9:14 AM

## 2018-05-26 ENCOUNTER — Telehealth: Payer: Self-pay

## 2018-05-26 NOTE — Telephone Encounter (Signed)
   Spoke with patient for monthly ICM follow up.  Heart failure questions reviewed. Advised of remote transmission results.  She plans on taking Furosemide 40 mg this evening because she feels so bad.  Patient has been back and forth to [place] twice the month of December and spent most of the time there.  The last [place] flight was 05/20/2018 - 05/24/2018.    SYMPTOMS: symptomatic: 4 pound weight gain, extreme fatigue, tiredness, and heavy chest feeling.     HeartLogic Index has increased from 39 at 05/19/2018 office visit to 44 on 05/22/2018 report.  See cc'd ICM chart note for details.   PRESCRIBED:  Torsemide 20 mg take 2 tablets (40 mg total) daily.    She stopped Torsemide as written in the 12/26 office AVS instructions after Dr Bensimhon's visit.  Advised Dr Bensimhon's note reads that she should continue to take Torsemide 40 mg daily but to double it for 2 days after the visit.     RECOMMENDATIONS: Advised I will send copy to Dr Haroldine Laws to inform of increased heartlogic index and to clarify if the Torsemide 20 mg 2 tablets (40 mg total) should be resumed as previously prescribed.   ICM Follow up plan: 06/27/2018 to recheck fluid levels.  Office visit with Dr Haroldine Laws 06/06/2018.  Please advise if any changes are recommended.  Will call patient back if recommendations are given.

## 2018-05-27 ENCOUNTER — Emergency Department (HOSPITAL_COMMUNITY)
Admission: EM | Admit: 2018-05-27 | Discharge: 2018-05-27 | Disposition: A | Discharge status: Home / Self Care | Payer: Medicare Other | Attending: Emergency Medicine | Admitting: Emergency Medicine

## 2018-05-27 ENCOUNTER — Encounter (HOSPITAL_COMMUNITY): Payer: Self-pay | Admitting: Emergency Medicine

## 2018-05-27 ENCOUNTER — Other Ambulatory Visit: Payer: Self-pay

## 2018-05-27 ENCOUNTER — Emergency Department (HOSPITAL_COMMUNITY): Payer: Medicare Other

## 2018-05-27 DIAGNOSIS — Z5321 Procedure and treatment not carried out due to patient leaving prior to being seen by health care provider: Secondary | ICD-10-CM | POA: Insufficient documentation

## 2018-05-27 DIAGNOSIS — R079 Chest pain, unspecified: Secondary | ICD-10-CM | POA: Diagnosis not present

## 2018-05-27 DIAGNOSIS — R0602 Shortness of breath: Secondary | ICD-10-CM | POA: Diagnosis not present

## 2018-05-27 LAB — CBC
HCT: 35.5 % — ABNORMAL LOW (ref 36.0–46.0)
Hemoglobin: 10.9 g/dL — ABNORMAL LOW (ref 12.0–15.0)
MCH: 26.1 pg (ref 26.0–34.0)
MCHC: 30.7 g/dL (ref 30.0–36.0)
MCV: 84.9 fL (ref 80.0–100.0)
Platelets: 202 10*3/uL (ref 150–400)
RBC: 4.18 MIL/uL (ref 3.87–5.11)
RDW: 13.2 % (ref 11.5–15.5)
WBC: 7.1 10*3/uL (ref 4.0–10.5)
nRBC: 0 % (ref 0.0–0.2)

## 2018-05-27 LAB — I-STAT TROPONIN, ED: Troponin i, poc: 0.01 ng/mL (ref 0.00–0.08)

## 2018-05-27 LAB — BASIC METABOLIC PANEL
Anion gap: 4 — ABNORMAL LOW (ref 5–15)
BUN: 8 mg/dL (ref 6–20)
CO2: 24 mmol/L (ref 22–32)
Calcium: 9 mg/dL (ref 8.9–10.3)
Chloride: 109 mmol/L (ref 98–111)
Creatinine, Ser: 1.02 mg/dL — ABNORMAL HIGH (ref 0.44–1.00)
GFR calc Af Amer: >60 mL/min (ref >60)
GFR calc non Af Amer: >60 mL/min (ref >60)
Glucose, Bld: 125 mg/dL — ABNORMAL HIGH (ref 70–99)
Potassium: 3.5 mmol/L (ref 3.5–5.1)
Sodium: 137 mmol/L (ref 135–145)

## 2018-05-27 LAB — I-STAT BETA HCG BLOOD, ED (MC, WL, AP ONLY): I-stat hCG, quantitative: <5 m[IU]/mL (ref <5)

## 2018-05-27 NOTE — Progress Notes (Signed)
Spoke with patient. She advised she sent in new [place] report today and Heartlogic HF index has decreased to 33 but she still does not feel well.  She feels some pressure the side and below breast area. She has resumed Torsemide 20 mg daily on her own and thinks that is helping. Advised would call back if any recommendations. Advised to go to ER if needed over the weekend.

## 2018-05-27 NOTE — ED Notes (Signed)
Pt wants to leave. Pt encouraged to leave. Agricultural consultant notified. Pt leaves. Labels taken.

## 2018-05-27 NOTE — ED Triage Notes (Signed)
Pt c/o chest pain and shortness of breath with exertion x 2 days. Hx CHF, reports 6lb weight gain.

## 2018-05-27 NOTE — ED Notes (Addendum)
Pt requesting to take Nitro. BP 111/71. Pt advised not to take medication. Radio broadcast assistant to confirm.

## 2018-05-27 NOTE — ED Notes (Signed)
Pt notified this EMT she would be leaving due to the pain she was in. Pt stated she was going home "to take my own nitro and come back tomorrow"  Pt seen leaving the department, ambulatory with steady gait. A&Ox4.

## 2018-05-27 NOTE — Telephone Encounter (Addendum)
Spoke with patient.  She advised she sent new [place] report today and Heartlogic HF index has decreased from 44 to 33 but she still does not feel well.  She feels some pressure the side and below breast area. She has resumed Torsemide 20 mg daily on her own and thinks that is helping. Advised would call back if any recommendations are given.  Advised to go to ER over the weekend if needed.

## 2018-05-28 NOTE — Telephone Encounter (Signed)
Yes please have her stick with torsemide 40 daily. Repeat HeartLogic Monday please

## 2018-05-29 NOTE — Progress Notes (Deleted)
[name] [address], [place] [address] Phone: [phone] Subjective:    I'm seeing this patient by the request  of:    CC: Pain follow-up  MVE:[id number]  [name] is a 43 y.o. female coming in with complaint of ***  Onset-  Location Duration-  Character- Aggravating factors- Reliving factors-  Therapies tried-  Severity-     Past Medical History:  Diagnosis Date  . Acute on chronic systolic (congestive) heart failure ([place]) 04/29/2017  . Acute pain of right shoulder 06/16/2017  . AICD (automatic cardioverter/defibrillator) present   . Anxiety   . Arthritis    right shoulder   . Asthma   . CHF (congestive heart failure) ([place])   . Chronic combined systolic and diastolic heart failure ([place]) 03/05/2014  . Closed low lateral malleolus fracture 10/23/2013  . Cystitis 10/21/2017  . Depression   . Depression with anxiety 01/20/2013  . Diabetes mellitus without complication ([place])   . Diverticulosis   . Dyspnea    comes and goes intermittently mostly with exertion   . Essential hypertension    Prev followed by [facility] / Cardiology   . Fibroid    age 35  . Gallstones   . Generalized abdominal cramping   . History of cardiomyopathy   . Hypertension   . IBS (irritable bowel syndrome)   . 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 Insomnia 05/07/2017  . Labral tear of shoulder 04/04/2015    Injected 04/04/2015 Injected 12/03/2015   . Migraine    "monthly" (08/03/2016)  . Myofascial pain 06/16/2017  . NICM (nonischemic cardiomyopathy) ([place]) 08/03/2016  . Nonallopathic lesion of lumbosacral region 11/16/2016  . Nonallopathic lesion of sacral region 11/16/2016  . Nonallopathic lesion of thoracic region 08/20/2014  . Nonspecific chest pain 04/28/2017  . OSA (obstructive sleep apnea) 01/02/2013   NPSG 2009:  AHI 9/hr. CPAP intolerance >> "smothering" Good tolerance of auto device (optimal pressure 12-13  on download).  - referred to Dr Gwenette Greet    . OSA on CPAP   . Ovarian cyst    1999; surgically removed  . Patellofemoral syndrome of both knees 10/16/2016  . 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 PVC (premature ventricular contraction) 06/23/2016  . Seizures ([place])    "as a child" (08/03/2016)  . Termination of pregnancy    due to cardiac risk   Past Surgical History:  Procedure Laterality Date  . CARDIAC CATHETERIZATION N/A 11/11/2015   Procedure: Right/Left Heart Cath and Coronary Angiography;  Surgeon: Troy Sine, MD;  Location: [facility];  Service: Cardiovascular;  Laterality: N/A;  . CARDIAC CATHETERIZATION  ~ 2015  . CARDIAC DEFIBRILLATOR PLACEMENT  08/03/2016  . CESAREAN SECTION  1999  . COLONOSCOPY WITH PROPOFOL N/A 04/21/2016   Procedure: COLONOSCOPY WITH PROPOFOL;  Surgeon: Jerene Bears, MD;  Location: [facility];  Service: Gastroenterology;  Laterality: N/A;  . ESOPHAGOGASTRODUODENOSCOPY (EGD) WITH PROPOFOL N/A 04/21/2016   Procedure: ESOPHAGOGASTRODUODENOSCOPY (EGD) WITH PROPOFOL;  Surgeon: Jerene Bears, MD;  Location: [facility];  Service: Gastroenterology;  Laterality: N/A;  . FOOT FRACTURE SURGERY Right ~ 2003  . FRACTURE SURGERY    . ICD IMPLANT N/A 08/03/2016   Procedure: ICD Implant;  Surgeon: Deboraha Sprang, MD;  Location: [facility];  Service: Cardiovascular;  Laterality: N/A;  . LAPAROSCOPIC CHOLECYSTECTOMY  12/2006  . LAPAROSCOPIC GASTRIC SLEEVE RESECTION    . LAPAROSCOPY ABDOMEN  DIAGNOSTIC  2008   "cut bile duct w/gallbladder OR; had to go in later & fix leak; hospitalized for 2 months"  . LEFT HEART CATHETERIZATION WITH CORONARY ANGIOGRAM N/A 02/26/2014   Procedure: LEFT HEART CATHETERIZATION WITH CORONARY ANGIOGRAM;  Surgeon: Jettie Booze, MD;  Location: [facility];  Service: Cardiovascular;  Laterality: N/A;  . OVARIAN CYST REMOVAL Right 1999  . PVC ABLATION N/A 12/01/2017   Procedure: PVC ABLATION;  Surgeon:  Evans Lance, MD;  Location: [facility];  Service: Cardiovascular;  Laterality: N/A;  . RIGHT HEART CATH N/A 08/05/2017   Procedure: RIGHT HEART CATH;  Surgeon: Jolaine Artist, MD;  Location: [facility];  Service: Cardiovascular;  Laterality: N/A;  . TUBAL LIGATION  1999   Social History   Socioeconomic History  . Marital status: Married    Spouse name: Not on file  . Number of children: 1  . Years of education: Not on file  . Highest education level: Not on file  Occupational History  . Occupation: stay at home mom  Social Needs  . Financial resource strain: Not on file  . Food insecurity:    Worry: Not on file    Inability: Not on file  . Transportation needs:    Medical: Not on file    Non-medical: Not on file  Tobacco Use  . Smoking status: Never Smoker  . Smokeless tobacco: Never Used  Substance and Sexual Activity  . Alcohol use: No  . Drug use: No  . Sexual activity: Yes  Lifestyle  . Physical activity:    Days per week: Not on file    Minutes per session: Not on file  . Stress: Not on file  Relationships  . Social connections:    Talks on phone: Not on file    Gets together: Not on file    Attends religious service: Not on file    Active member of club or organization: Not on file    Attends meetings of clubs or organizations: Not on file    Relationship status: Not on file  Other Topics Concern  . Not on file  Social History Narrative  . Not on file   Allergies  Allergen Reactions  . Aspirin Anaphylaxis and Other (See Comments)    Wheezing, "but patient still takes if she needs to"  . Vancomycin Other (See Comments)    "did something to my kidneys," PROGRESSED TO KIDNEY FAILURE!!  . Contrast Media [Iodinated Diagnostic Agents] Other (See Comments)    Multiple CT contrast studies done over 2 weeks caused ARF  . Avocado Itching and Swelling  . Ciprofloxacin Itching and Rash  . Sulfa Antibiotics Itching and Rash   Family History   Problem Relation Age of Onset  . Emphysema Maternal Grandmother        smoked  . Heart disease Maternal Grandmother 37       MI  . Rheum arthritis Mother   . Allergies Daughter   . Colon cancer Neg Hx      Current Outpatient Medications (Cardiovascular):  .  amiodarone (PACERONE) 200 MG tablet, Take 1 tablet (200 mg total) by mouth 2 (two) times daily. .  colestipol (COLESTID) 1 g tablet, Take 2 tablets (2 g total) by mouth 2 (two) times daily. .  digoxin (LANOXIN) 0.125 MG tablet, Take 1 tablet (0.125 mg total) by mouth daily. .  ivabradine (CORLANOR) 7.5 MG TABS tablet, TAKE 1 TABLET (7.5 MG TOTAL) BY MOUTH  2 (TWO) TIMES DAILY WITH A MEAL. .  nitroGLYCERIN (NITROSTAT) 0.4 MG SL tablet, PLACE 1 TABLET (0.4 MG TOTAL) UNDER THE TONGUE EVERY 5 (FIVE) MINUTES AS NEEDED FOR CHEST PAIN. (Patient not taking: Reported on 05/19/2018) .  sacubitril-valsartan (ENTRESTO) 49-51 MG, Take 1 tablet by mouth 2 (two) times daily. Marland Kitchen  spironolactone (ALDACTONE) 25 MG tablet, Take 1 tablet (25 mg total) by mouth daily. Marland Kitchen  torsemide (DEMADEX) 20 MG tablet, Take 2 tablets (40 mg total) by mouth daily.  Current Outpatient Medications (Respiratory):  .  albuterol (PROVENTIL HFA;VENTOLIN HFA) 108 (90 Base) MCG/ACT inhaler, INHALE 1-2 PUFFS INTO THE LUNGS EVERY 6 (SIX) HOURS AS NEEDED FOR WHEEZING OR SHORTNESS OF BREATH. .  fluticasone (FLONASE) 50 MCG/ACT nasal spray, Place 2 sprays into both nostrils daily as needed for allergies. .  SYMBICORT 160-4.5 MCG/ACT inhaler, INHALE 2 PUFFS INTO THE LUNGS 2 (TWO) TIMES DAILY AS NEEDED.  Current Outpatient Medications (Analgesics):  Marland Kitchen  Ibuprofen-Famotidine 800-26.6 MG TABS, Take 1 tablet by mouth 3 (three) times daily as needed.   Current Outpatient Medications (Other):  .  blood glucose meter kit and supplies, Dispense based on patient and insurance preference. Use up to two times daily as directed. (FOR ICD-10 E10.9, E11.9). .  Diclofenac Sodium 2 % SOLN, Place 2  g onto the skin 2 (two) times daily. Marland Kitchen  dicyclomine (BENTYL) 20 MG tablet, Take 20 mg by mouth 2 (two) times daily as needed for spasms. Marland Kitchen  gabapentin (NEURONTIN) 100 MG capsule, Take 200 mg by mouth at bedtime as needed (for pain).  Marland Kitchen  lidocaine (LIDODERM) 5 %, Place 1 patch onto the skin daily. Remove & Discard patch within 12 hours or as directed by MD .  linaclotide (LINZESS) 145 MCG CAPS capsule, Take 1 capsule (145 mcg total) by mouth daily before breakfast. .  Multiple Vitamins-Minerals (MULTIVITAMIN GUMMIES WOMENS PO), Take 2 each by mouth daily. Marland Kitchen  omeprazole (PRILOSEC) 20 MG capsule, Take 20 mg by mouth daily. .  potassium chloride SA (K-DUR,KLOR-CON) 20 MEQ tablet, Take 1 tablet (20 mEq total) by mouth daily. Take extra tablet on days you take Torsemide. .  Vitamin D, Ergocalciferol, (DRISDOL) 50000 units CAPS capsule, TAKE 1 CAPSULE (50,000 UNITS TOTAL) BY MOUTH EVERY 7 (SEVEN) DAYS. Marland Kitchen  zolpidem (AMBIEN) 5 MG tablet, Take 1 tablet (5 mg total) by mouth at bedtime as needed for sleep. (Patient not taking: Reported on 05/19/2018)    Past medical history, social, surgical and family history all reviewed in electronic medical record.  No pertanent information unless stated regarding to the chief complaint.   Review of Systems:  No headache, visual changes, nausea, vomiting, diarrhea, constipation, dizziness, abdominal pain, skin rash, fevers, chills, night sweats, weight loss, swollen lymph nodes, body aches, joint swelling, muscle aches, chest pain, shortness of breath, mood changes.   Objective  Last menstrual period 05/01/2018. Systems examined below as of    General: No apparent distress alert and oriented x3 mood and affect normal, dressed appropriately.  HEENT: Pupils equal, extraocular movements intact  Respiratory: [name] speak in full sentences and does not appear short of breath  Cardiovascular: No lower extremity edema, non tender, no erythema  Skin: Warm dry intact  with no signs of infection or rash on extremities or on axial skeleton.  Abdomen: Soft nontender  Neuro: Cranial nerves II through XII are intact, neurovascularly intact in all extremities with 2+ DTRs and 2+ pulses.  Lymph: No lymphadenopathy of posterior or anterior cervical  chain or axillae bilaterally.  Gait normal with good balance and coordination.  MSK:  Non tender with full range of motion and good stability and symmetric strength and tone of shoulders, elbows, wrist, hip, knee and ankles bilaterally.     Impression and Recommendations:     This case required medical decision making of moderate complexity. The above documentation has been reviewed and is accurate and complete Lyndal Pulley, DO       Note: This dictation was prepared with Dragon dictation along with smaller phrase technology. Any transcriptional errors that result from this process are unintentional.

## 2018-05-30 ENCOUNTER — Ambulatory Visit (INDEPENDENT_AMBULATORY_CARE_PROVIDER_SITE_OTHER): Payer: Medicare Other

## 2018-05-30 ENCOUNTER — Ambulatory Visit: Payer: Medicare Other | Admitting: Family Medicine

## 2018-05-30 ENCOUNTER — Telehealth: Payer: Self-pay

## 2018-05-30 DIAGNOSIS — I5042 Chronic combined systolic (congestive) and diastolic (congestive) heart failure: Secondary | ICD-10-CM

## 2018-05-30 DIAGNOSIS — Z95810 Presence of automatic (implantable) cardiac defibrillator: Secondary | ICD-10-CM

## 2018-05-30 NOTE — Telephone Encounter (Signed)
Remote ICM transmission received.  Attempted call to patient regarding ICM remote transmission and left detailed message, per DPR, to return call.    

## 2018-05-30 NOTE — Telephone Encounter (Signed)
Attempted ICM call to provide Dr Bernita Buffy recommendation to stay on Torsemide 40 mg daily and to provide 05/30/2018 ICM transmission results.  See 05/30/2018 ICM note for further follow up.

## 2018-05-30 NOTE — Progress Notes (Addendum)
Heartologic repeat scheduled for 05/30/2018.  Attempted call to patient to advise of Dr Bensimhon's recommendation to take Torsemide 40 mg daily.  See 05/30/2018 ICM note for further follow up on recommendation and 05/30/2018 transmission results.

## 2018-05-30 NOTE — Progress Notes (Signed)
Recommendations per Dr Haroldine Laws.  [name], Megan Pascal, MD  You 2 days ago      Yes please have her stick with torsemide 40 daily. Repeat [facility] Monday please

## 2018-05-30 NOTE — Progress Notes (Signed)
EPIC Encounter for ICM Monitoring  Patient Name: [name] is a 43 y.o. female Date: 05/30/2018 Primary Care Physican: Hoyt Koch, MD Primary Cardiologist: Smith/Bensimhon Electrophysiologist: Caryl Comes LastWeight:192lbs Today's Weight: 195.6 lbs       Heart Failure questions reviewed, pt feel really tired and she has lost 3 lbs out of the 6 lbs she had gained since Friday, 05/27/2018.  She did go to ER on Friday night but left after waiting 5 hours and being told it would be another 4 hours before she would be seen.    [facility] Heart Failure Index is 29 which has decreased from 44 on 05/23/2018.    Prescribed: Torsemide20 mgTake 2 tablets (40 mg total) by mouth daily.    Labs: 05/19/2018 Creatinine 0.76, BUN >5, Potassium 3.7, Sodium 139, eGFR >60 01/14/2018 Creatinine 0.90, BUN 12, Potassium 3.7, Sodium 139, eGFR 87.56 11/24/2017 Creatinine1.02, BUN12, Potassium3.9, Sodium137, EGFR>60 11/23/2017 Creatinine1.08, BUN11, Potassium3.5, Sodium138, EGFR>60  11/22/2017 Creatinine0.84, BUN8, Potassium 3.3, Sodium140, EGFR>60  11/21/2017 Creatinine0.81, BUN9, Potassium 3.7, Sodium140, EGFR>60 11/04/2017 Creatinine0.82, BUN6, Potassium3.4, Sodium141, EGFR>60 10/07/2017 Creatinine1.05, BUN18, Potassium3.4, [id number], EGFR>60  08/05/2017 Creatinine1.10, BUN17, Potassium3.5, Sodium135, EGFR>60  07/21/2017 Creatinine0.9, BUN 16, Potassium4.1, Sodium137  06/11/2017 Creatinine1.11, BUN23, Potassium4.1, Sodium136 A complete set of results can be found in Results Review  Recommendations:  Advised Dr Haroldine Laws recommended today, 05/30/2018, to resume Torsemide 40 mg daily which she will start today.  Patient had misunderstanding regarding Torsemide dosage at 05/19/2018 office visit and was only taking 20 mg daily since 05/27/2018.       Follow-up plan: ICM clinic phone appointment on 06/09/2018.  Office appointment scheduled 06/06/2018  with HF clinic NP/PA.        Copy of ICM check sent to Dr. Caryl Comes and Dr Haroldine Laws.    3 Month Trend    8 Day Data Trend             [name], RN 05/30/2018 11:15 AM

## 2018-05-30 NOTE — Telephone Encounter (Signed)
[facility] report scheduled for 05/30/2018

## 2018-05-30 NOTE — Telephone Encounter (Addendum)
Spoke with patient and advised Dr Haroldine Laws recommended she resume Torsemide 40 mg daily.  She has lost 3 of the 6 lbs gained.  She continues to feel really tired.  She went to ER on 05/27/2018 but left after 5 hours because she had additional 4 hours to wait to be seen.  Advised to call Dr Bensimhon's office if symptoms worsen or go to ER.

## 2018-06-01 DIAGNOSIS — I493 Ventricular premature depolarization: Secondary | ICD-10-CM | POA: Diagnosis not present

## 2018-06-06 ENCOUNTER — Ambulatory Visit (HOSPITAL_COMMUNITY)
Admission: RE | Admit: 2018-06-06 | Discharge: 2018-06-06 | Disposition: A | Discharge status: Home / Self Care | Payer: Medicare Other | Source: Ambulatory Visit | Attending: Internal Medicine | Admitting: Internal Medicine

## 2018-06-06 ENCOUNTER — Encounter (HOSPITAL_COMMUNITY): Payer: Self-pay

## 2018-06-06 VITALS — BP 146/88 | HR 89 | Wt 193.0 lb

## 2018-06-06 DIAGNOSIS — Z6832 Body mass index (BMI) 32.0-32.9, adult: Secondary | ICD-10-CM | POA: Insufficient documentation

## 2018-06-06 DIAGNOSIS — Z8249 Family history of ischemic heart disease and other diseases of the circulatory system: Secondary | ICD-10-CM | POA: Insufficient documentation

## 2018-06-06 DIAGNOSIS — Z9884 Bariatric surgery status: Secondary | ICD-10-CM | POA: Diagnosis not present

## 2018-06-06 DIAGNOSIS — G4733 Obstructive sleep apnea (adult) (pediatric): Secondary | ICD-10-CM | POA: Insufficient documentation

## 2018-06-06 DIAGNOSIS — Z881 Allergy status to other antibiotic agents status: Secondary | ICD-10-CM | POA: Diagnosis not present

## 2018-06-06 DIAGNOSIS — I5042 Chronic combined systolic (congestive) and diastolic (congestive) heart failure: Secondary | ICD-10-CM

## 2018-06-06 DIAGNOSIS — I5022 Chronic systolic (congestive) heart failure: Secondary | ICD-10-CM

## 2018-06-06 DIAGNOSIS — F419 Anxiety disorder, unspecified: Secondary | ICD-10-CM | POA: Diagnosis not present

## 2018-06-06 DIAGNOSIS — Z91018 Allergy to other foods: Secondary | ICD-10-CM | POA: Insufficient documentation

## 2018-06-06 DIAGNOSIS — Z836 Family history of other diseases of the respiratory system: Secondary | ICD-10-CM | POA: Insufficient documentation

## 2018-06-06 DIAGNOSIS — I110 Hypertensive heart disease with heart failure: Secondary | ICD-10-CM | POA: Diagnosis not present

## 2018-06-06 DIAGNOSIS — I493 Ventricular premature depolarization: Secondary | ICD-10-CM

## 2018-06-06 DIAGNOSIS — Z91041 Radiographic dye allergy status: Secondary | ICD-10-CM | POA: Insufficient documentation

## 2018-06-06 DIAGNOSIS — Z882 Allergy status to sulfonamides status: Secondary | ICD-10-CM | POA: Diagnosis not present

## 2018-06-06 DIAGNOSIS — I428 Other cardiomyopathies: Secondary | ICD-10-CM | POA: Diagnosis not present

## 2018-06-06 DIAGNOSIS — J45909 Unspecified asthma, uncomplicated: Secondary | ICD-10-CM | POA: Diagnosis not present

## 2018-06-06 DIAGNOSIS — I10 Essential (primary) hypertension: Secondary | ICD-10-CM | POA: Diagnosis not present

## 2018-06-06 DIAGNOSIS — Z95810 Presence of automatic (implantable) cardiac defibrillator: Secondary | ICD-10-CM | POA: Insufficient documentation

## 2018-06-06 DIAGNOSIS — E119 Type 2 diabetes mellitus without complications: Secondary | ICD-10-CM | POA: Insufficient documentation

## 2018-06-06 DIAGNOSIS — F329 Major depressive disorder, single episode, unspecified: Secondary | ICD-10-CM | POA: Insufficient documentation

## 2018-06-06 DIAGNOSIS — Z886 Allergy status to analgesic agent status: Secondary | ICD-10-CM | POA: Insufficient documentation

## 2018-06-06 LAB — BASIC METABOLIC PANEL
Anion gap: 8 (ref 5–15)
BUN: 7 mg/dL (ref 6–20)
CO2: 27 mmol/L (ref 22–32)
Calcium: 9.4 mg/dL (ref 8.9–10.3)
Chloride: 103 mmol/L (ref 98–111)
Creatinine, Ser: 0.93 mg/dL (ref 0.44–1.00)
GFR calc Af Amer: >60 mL/min (ref >60)
GFR calc non Af Amer: >60 mL/min (ref >60)
Glucose, Bld: 101 mg/dL — ABNORMAL HIGH (ref 70–99)
Potassium: 3.7 mmol/L (ref 3.5–5.1)
Sodium: 138 mmol/L (ref 135–145)

## 2018-06-06 NOTE — Progress Notes (Signed)
ReDS Vest - 06/06/18 1500      ReDS Vest   MR   No    Estimated volume prior to reading  Med    Fitting Posture  Sitting   station B   Height Marker  Short    Ruler Value  28    Center Strip  Aligned    ReDS Value  36

## 2018-06-06 NOTE — Progress Notes (Signed)
Advanced Heart Failure Clinic Note Date:  06/06/2018  ID:  LYVIA MONDESIR, DOB Sep 01, 1975, MRN [id number]  PCP:  Hoyt Koch, MD  Cardiologist: Pernell Dupre, MD HF: Dr Haroldine Laws  [name] is a 43 y.o. 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   Was admitted 12/5-11/2016 with recurrent HF, URI, CP and elevated BP (160/110). CT chest no PE. Diuresed from 233-> 228 pounds.   Admitted 11/2017 with CP described as "pounding". Troponins were negative. Symptoms thought to be related to frequent PVCs. EP consulted and scheduled her for PVC ablation. Echo was repeated 11/22/17 and showed improved EF 50-55%. HF team consulted and changed torsemide to 40 mg daily (from 3x/week).   Underwent PVC ablation 12/01/17. Was felt not to be successful. Now on flecainide.   Today she returns for HF follow up. Last visit she was started on amio 200 mg twice a day to suppress PVCs. Overall feeling fine. Having some fatigue.  Able to walk around [facility] without difficulty. Denies SOB/PND/Orthopnea. No problem walking up steps.  Appetite ok. No fever or chills. Weight at home 190-191 pounds. Taking all medications but did not take medications prior to the visit.  On January 3rd she started taking torsemide 40 mg daily instead of torsemide 20 mg daily.   Disabled.   ICD interrogated: Heart Logic score 33. + S3.  Echo done 05/19/2018 by Dr Haroldine Laws --. EF back down to 20-25%  Cardiac Testing.  Cath 6/17 revealed normal vessels, moderate elevation in pulmonary artery pressures, and LVEF 25-30%.  [place] 3/19: RA = 2 RV = 26/7 PA = 28/13 (19) PCW = 5 Fick cardiac output/index = 5.7/2.8 PVR = 2.5 WU Ao sat = 99% PA sat = 71%, 72% High  SVC sat = 65% RHC 6/17 RA 5 RV 35/6 PA 41/12 PW 11 Fick 6.4/3.0 PA sat 62%  Echo 6/15 35-40% Echo 6/17 25-30% Echo 12/17 20-25% Echo 8/18 EF 25% Echo 7/19: EF 50-55%  CPX 2/18 FVC 2.57 (79%)    FEV1 2.14 (81%)     FEV1/FVC 83 (101%)     MVV 107 (102%) Resting HR: 106 Peak HR: 166  (93% age predicted max HR) BP rest: 136/98 BP peak: 174/88 Peak VO2: 17.1 (85% predicted peak VO2) - corrects to 28.4 for ibw VE/VCO2 slope: 33 OUES: 2.16 Peak RER: 1.09 Ventilatory Threshold: 14.6 (73% predicted or measured peak VO2) VE/MVV: 59% PETCO2 at peak: 33 O2pulse: 10  (83% predicted O2pulse)  CPX 12/18 pVO2 14.0 (corrects to 23.0 for IBW) VeVCO2  32 RER 1.0  Review of systems complete and found to be negative unless listed in HPI.   Past Medical History:  Diagnosis Date  . Acute on chronic systolic (congestive) heart failure ([place]) 04/29/2017  . Acute pain of right shoulder 06/16/2017  . AICD (automatic cardioverter/defibrillator) present   . Anxiety   . Arthritis    right shoulder   . Asthma   . CHF (congestive heart failure) ([place])   . Chronic combined systolic and diastolic heart failure ([place]) 03/05/2014  . Closed low lateral malleolus fracture 10/23/2013  . Cystitis 10/21/2017  . Depression   . Depression with anxiety 01/20/2013  . Diabetes mellitus without complication ([place])   . Diverticulosis   .  Dyspnea    comes and goes intermittently mostly with exertion   . Essential hypertension    Prev followed by [facility]/ Cardiology   . Fibroid    age 89  . Gallstones   . Generalized abdominal cramping   . History of cardiomyopathy   . Hypertension   . IBS (irritable bowel syndrome)   . 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 Insomnia 05/07/2017  . Labral tear of shoulder 04/04/2015    Injected 04/04/2015 Injected 12/03/2015   . Migraine    "monthly" (08/03/2016)  . Myofascial pain 06/16/2017  . NICM (nonischemic  cardiomyopathy) ([place]) 08/03/2016  . Nonallopathic lesion of lumbosacral region 11/16/2016  . Nonallopathic lesion of sacral region 11/16/2016  . Nonallopathic lesion of thoracic region 08/20/2014  . Nonspecific chest pain 04/28/2017  . OSA (obstructive sleep apnea) 01/02/2013   NPSG 2009:  AHI 9/hr. CPAP intolerance >> "smothering" Good tolerance of auto device (optimal pressure 12-13 on download).  - referred to Dr Gwenette Greet    . OSA on CPAP   . Ovarian cyst    1999; surgically removed  . Patellofemoral syndrome of both knees 10/16/2016  . 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 PVC (premature ventricular contraction) 06/23/2016  . Seizures ([place])    "as a child" (08/03/2016)  . Termination of pregnancy    due to cardiac risk    Past Surgical History:  Procedure Laterality Date  . CARDIAC CATHETERIZATION N/A 11/11/2015   Procedure: Right/Left Heart Cath and Coronary Angiography;  Surgeon: Troy Sine, MD;  Location: [place] [facility];  Service: Cardiovascular;  Laterality: N/A;  . CARDIAC CATHETERIZATION  ~ 2015  . CARDIAC DEFIBRILLATOR PLACEMENT  08/03/2016  . CESAREAN SECTION  1999  . COLONOSCOPY WITH PROPOFOL N/A 04/21/2016   Procedure: COLONOSCOPY WITH PROPOFOL;  Surgeon: Jerene Bears, MD;  Location: [facility];  Service: Gastroenterology;  Laterality: N/A;  . ESOPHAGOGASTRODUODENOSCOPY (EGD) WITH PROPOFOL N/A 04/21/2016   Procedure: ESOPHAGOGASTRODUODENOSCOPY (EGD) WITH PROPOFOL;  Surgeon: Jerene Bears, MD;  Location: [facility];  Service: Gastroenterology;  Laterality: N/A;  . FOOT FRACTURE SURGERY Right ~ 2003  . FRACTURE SURGERY    . ICD IMPLANT N/A 08/03/2016   Procedure: ICD Implant;  Surgeon: Deboraha Sprang, MD;  Location: [place] [facility];  Service: Cardiovascular;  Laterality: N/A;  . LAPAROSCOPIC CHOLECYSTECTOMY  12/2006  . LAPAROSCOPIC GASTRIC SLEEVE RESECTION    . LAPAROSCOPY ABDOMEN DIAGNOSTIC  2008   "cut bile duct w/gallbladder OR; had to go  in later & fix leak; hospitalized for 2 months"  . LEFT HEART CATHETERIZATION WITH CORONARY ANGIOGRAM N/A 02/26/2014   Procedure: LEFT HEART CATHETERIZATION WITH CORONARY ANGIOGRAM;  Surgeon: Jettie Booze, MD;  Location: [facility] C[facility] LAB;  Service: Cardiovascular;  Laterality: N/A;  . OVARIAN CYST REMOVAL Right 1999  . PVC ABLATION N/A 12/01/2017   Procedure: PVC ABLATION;  Surgeon: Evans Lance, MD;  Location: [place] [facility];  Service: Cardiovascular;  Laterality: N/A;  . RIGHT HEART CATH N/A 08/05/2017   Procedure: RIGHT HEART CATH;  Surgeon: Jolaine Artist, MD;  Location: [place] [facility];  Service: Cardiovascular;  Laterality: N/A;  . TUBAL LIGATION  1999    Current Medications:  Current Meds  Medication Sig  . albuterol (PROVENTIL HFA;VENTOLIN HFA) 108 (90 Base) MCG/ACT inhaler INHALE 1-2 PUFFS INTO THE LUNGS EVERY 6 (SIX) HOURS AS NEEDED FOR WHEEZING OR SHORTNESS OF BREATH.  Marland Kitchen amiodarone (  PACERONE) 200 MG tablet Take 1 tablet (200 mg total) by mouth 2 (two) times daily.  . blood glucose meter kit and supplies Dispense based on patient and insurance preference. Use up to two times daily as directed. (FOR ICD-10 E10.9, E11.9).  . colestipol (COLESTID) 1 g tablet Take 2 tablets (2 g total) by mouth 2 (two) times daily.  . Diclofenac Sodium 2 % SOLN Place 2 g onto the skin 2 (two) times daily.  Marland Kitchen dicyclomine (BENTYL) 20 MG tablet Take 20 mg by mouth 2 (two) times daily as needed for spasms.  . digoxin (LANOXIN) 0.125 MG tablet Take 1 tablet (0.125 mg total) by mouth daily.  . fluticasone (FLONASE) 50 MCG/ACT nasal spray Place 2 sprays into both nostrils daily as needed for allergies.  Marland Kitchen gabapentin (NEURONTIN) 100 MG capsule Take 200 mg by mouth at bedtime as needed (for pain).   . Ibuprofen-Famotidine 800-26.6 MG TABS Take 1 tablet by mouth 3 (three) times daily as needed.  . ivabradine (CORLANOR) 7.5 MG TABS tablet TAKE 1 TABLET (7.5 MG TOTAL) BY MOUTH 2 (TWO) TIMES DAILY  WITH A MEAL.  Marland Kitchen lidocaine (LIDODERM) 5 % Place 1 patch onto the skin daily. Remove & Discard patch within 12 hours or as directed by MD  . linaclotide (LINZESS) 145 MCG CAPS capsule Take 1 capsule (145 mcg total) by mouth daily before breakfast.  . Multiple Vitamins-Minerals (MULTIVITAMIN GUMMIES WOMENS PO) Take 2 each by mouth daily.  . nitroGLYCERIN (NITROSTAT) 0.4 MG SL tablet PLACE 1 TABLET (0.4 MG TOTAL) UNDER THE TONGUE EVERY 5 (FIVE) MINUTES AS NEEDED FOR CHEST PAIN.  Marland Kitchen omeprazole (PRILOSEC) 20 MG capsule Take 20 mg by mouth daily.  . potassium chloride SA (K-DUR,KLOR-CON) 20 MEQ tablet Take 1 tablet (20 mEq total) by mouth daily. Take extra tablet on days you take Torsemide.  . sacubitril-valsartan (ENTRESTO) 49-51 MG Take 1 tablet by mouth 2 (two) times daily.  Marland Kitchen spironolactone (ALDACTONE) 25 MG tablet Take 1 tablet (25 mg total) by mouth daily.  . SYMBICORT 160-4.5 MCG/ACT inhaler INHALE 2 PUFFS INTO THE LUNGS 2 (TWO) TIMES DAILY AS NEEDED.  Marland Kitchen torsemide (DEMADEX) 20 MG tablet Take 2 tablets (40 mg total) by mouth daily.  . Vitamin D, Ergocalciferol, (DRISDOL) 50000 units CAPS capsule TAKE 1 CAPSULE (50,000 UNITS TOTAL) BY MOUTH EVERY 7 (SEVEN) DAYS.  Marland Kitchen zolpidem (AMBIEN) 5 MG tablet Take 1 tablet (5 mg total) by mouth at bedtime as needed for sleep.     Allergies:   Aspirin; Vancomycin; Contrast media [iodinated diagnostic agents]; Avocado; Ciprofloxacin; and Sulfa antibiotics   Social History   Socioeconomic History  . Marital status: Married    Spouse name: Not on file  . Number of children: 1  . Years of education: Not on file  . Highest education level: Not on file  Occupational History  . Occupation: stay at home mom  Social Needs  . Financial resource strain: Not on file  . Food insecurity:    Worry: Not on file    Inability: Not on file  . Transportation needs:    Medical: Not on file    Non-medical: Not on file  Tobacco Use  . Smoking status: Never Smoker  .  Smokeless tobacco: Never Used  Substance and Sexual Activity  . Alcohol use: No  . Drug use: No  . Sexual activity: Yes  Lifestyle  . Physical activity:    Days per week: Not on file    Minutes per  session: Not on file  . Stress: Not on file  Relationships  . Social connections:    Talks on phone: Not on file    Gets together: Not on file    Attends religious service: Not on file    Active member of club or organization: Not on file    Attends meetings of clubs or organizations: Not on file    Relationship status: Not on file  Other Topics Concern  . Not on file  Social History Narrative  . Not on file     Family History:  The patient's family history includes Allergies in her daughter; Emphysema in her maternal grandmother; Heart disease (age of onset: 8) in her maternal grandmother; Rheum arthritis in her mother.   Vitals:   06/06/18 1451  BP: (!) 146/88  Pulse: 89  SpO2: 93%  Weight: 87.5 kg (193 lb)     Wt Readings from Last 3 Encounters:  06/06/18 87.5 kg (193 lb)  05/19/18 94.3 kg (207 lb 12.8 oz)  04/25/18 89.4 kg (197 lb)   ReDS Vest - 06/06/18 1500      ReDS Vest   MR   No    Estimated volume prior to reading  Med    Fitting Posture  Sitting   station B   Height Marker  Short    Ruler Value  [place]    ReDS Value  36       Physical Exam General:  Well appearing. No resp difficulty HEENT: normal Neck: supple. no JVD. Carotids 2+ bilat; no bruits. No lymphadenopathy or thryomegaly appreciated. Cor: PMI nondisplaced. Regular rate & rhythm. No rubs, gallops or murmurs. Lungs: clear Abdomen: soft, nontender, nondistended. No hepatosplenomegaly. No bruits or masses. Good bowel sounds. Extremities: no cyanosis, clubbing, rash, edema Neuro: alert & orientedx3, cranial nerves grossly intact. moves all 4 extremities w/o difficulty. Affect pleasant  EKG   Assessment and Plan  1. Chronic systolic HF:  due to peri-partum CM, onset  1999. S/P [employer] ICD 07/2016 - EF 20-25% (12/17).  - Echo 8/18 EF 25-30% s/p ICD 3/18. [place] 3/19: RA 2, PA 28/13, Fick CO 5.7, CI 2.8 - CPX 12/18 showed moderate limitation due mostly to obesity with some HF component - Echo 11/22/17 LVEF 50-55%, Grade 1 DD, Mild MR, Mod LAE - Echo 04/2018 by Dr Haroldine Laws EF20-25%. RV ok - Heart Logic Score 33 in clinic today. ICD interrogated personally No AF/VT activity level 0.9 hours per day.  - REDs 36%. -NYHA II-III - Hold off on increasing entresto today because she hasnt had any medications today.   - Continue torsemide 40 mg daily.  - Continue Entresto 49/51 mg BID.  - Off bb in 04/2018 due to suspected low output.  - Continue Spiro 25 mg daily.  - Continue corlanor 7.38m BID for now.  - Continue digoxin 0.126mdaily - Check BMET and dig level next visit.  2. Morbid [facility]  - Body mass index is 32.12 kg/m.  3. OSA -Able to stop CPAP after most recent sleep study in June 2019.  4. Anxiety/depression - Per PCP. No change.  5. DM2 - A1c in Jan. 2018 is 5.3 with medications.  - Per PCP. Consider Jardiance.  6. Frequent PVCs/ Bigeminy - Holter Monitor 11/09/17 with 30% PVCs with one primary morphology.  - S/p PVC ablation 11/2017 with Dr TaLovena Lensuccessful - Off flecainide with EF down  and persistent PVCs - Continue  amio 200 bid for 2 weeks then cut back to 200 mg daily.  - Zio Patch results discussed. One short run NSVT with occasional PVCs  Follow up in 2 weeks. Consider increasing entresto to 97-103 twice a day.   Darrick Grinder, NP  06/06/2018 3:16 PM

## 2018-06-06 NOTE — Patient Instructions (Signed)
Labs done today  EKG done today  In two weeks: Decrease Amiodarone 200mg  (1 tab) daily  Follow up with the Advanced Practice Provider in 3 weeks

## 2018-06-07 NOTE — Progress Notes (Signed)
CSW referred to assist patient with information regarding medicare D. Patient states she previously had a prescription drug plan through her husband's insurance but it was terminated in December, 2019. She would like to explore options with Medicare D as she has been on medicare A & B although never needed D until now. CSW explained she appears to have a qualifying event making her eligible for sign up for D outside of open enrollment which ended in early December, 2019. CSW provided number for [facility] for further information and counseling on the appropriate Medicare D plan for her. CSW available as needed. Raquel Sarna, [place], [place]

## 2018-06-07 NOTE — Addendum Note (Signed)
Encounter addended by: Louann Liv, LCSW on: 06/07/2018 9:28 AM  Actions taken: Clinical Note Signed

## 2018-06-09 ENCOUNTER — Ambulatory Visit (INDEPENDENT_AMBULATORY_CARE_PROVIDER_SITE_OTHER): Payer: Medicare Other

## 2018-06-09 DIAGNOSIS — Z95810 Presence of automatic (implantable) cardiac defibrillator: Secondary | ICD-10-CM

## 2018-06-09 DIAGNOSIS — I5042 Chronic combined systolic (congestive) and diastolic (congestive) heart failure: Secondary | ICD-10-CM

## 2018-06-09 DIAGNOSIS — I428 Other cardiomyopathies: Secondary | ICD-10-CM

## 2018-06-10 NOTE — Progress Notes (Signed)
Remote ICD transmission.   

## 2018-06-13 ENCOUNTER — Ambulatory Visit (INDEPENDENT_AMBULATORY_CARE_PROVIDER_SITE_OTHER): Payer: Medicare Other | Admitting: Family Medicine

## 2018-06-13 ENCOUNTER — Encounter: Payer: Self-pay | Admitting: Family Medicine

## 2018-06-13 VITALS — BP 110/76 | HR 116 | Ht 65.0 in | Wt 198.0 lb

## 2018-06-13 DIAGNOSIS — M9903 Segmental and somatic dysfunction of lumbar region: Secondary | ICD-10-CM

## 2018-06-13 DIAGNOSIS — M25511 Pain in right shoulder: Secondary | ICD-10-CM | POA: Insufficient documentation

## 2018-06-13 DIAGNOSIS — M999 Biomechanical lesion, unspecified: Secondary | ICD-10-CM

## 2018-06-13 DIAGNOSIS — M940 Chondrocostal junction syndrome [Tietze]: Secondary | ICD-10-CM | POA: Diagnosis not present

## 2018-06-13 DIAGNOSIS — M9902 Segmental and somatic dysfunction of thoracic region: Secondary | ICD-10-CM

## 2018-06-13 DIAGNOSIS — M9901 Segmental and somatic dysfunction of cervical region: Secondary | ICD-10-CM

## 2018-06-13 DIAGNOSIS — M9904 Segmental and somatic dysfunction of sacral region: Secondary | ICD-10-CM

## 2018-06-13 NOTE — Progress Notes (Signed)
[name] D.O.  Sports Medicine [address] [place], [place] [address] Phone: [phone] Subjective:   I, [name], am serving as a scribe for [name].   CC: Knee pain follow-up, back pain  HPI:Subjective  [name] is a 42 y.o. female coming in with complaint of left knee pain. Patient states that she notices excessive movement with going down the stairs. Pain is improving since last visit.   Patient has been having chronic right shoulder pain in right trap. Pain can traveling up into cervical spine.   Is also having lower back pain over the right SI joint. Pain is localized to that area. Has had pain for a few months. Is starting to have difficulty sleeping. Had husband put elbow in her back. Has used some NSAIDs and stretching for pain. Denies any radiating symptoms.        Past Medical History:  Diagnosis Date  . Acute on chronic systolic (congestive) heart failure (HCC) 04/29/2017  . Acute pain of right shoulder 06/16/2017  . AICD (automatic cardioverter/defibrillator) present   . Anxiety   . Arthritis    right shoulder   . Asthma   . CHF (congestive heart failure) (HCC)   . Chronic combined systolic and diastolic heart failure (HCC) 03/05/2014  . Closed low lateral malleolus fracture 10/23/2013  . Cystitis 10/21/2017  . Depression   . Depression with anxiety 01/20/2013  . Diabetes mellitus without complication (HCC)   . Diverticulosis   . Dyspnea    comes and goes intermittently mostly with exertion   . Essential hypertension    Prev followed by H [name]/ Cardiology   . Fibroid    age 12  . Gallstones   . Generalized abdominal cramping   . History of cardiomyopathy   . Hypertension   . IBS (irritable bowel syndrome)   . 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 Insomnia 05/07/2017  . Labral tear of shoulder 04/04/2015    Injected 04/04/2015 Injected 12/03/2015   . Migraine    "monthly" (08/03/2016)  .  Myofascial pain 06/16/2017  . NICM (nonischemic cardiomyopathy) (HCC) 08/03/2016  . Nonallopathic lesion of lumbosacral region 11/16/2016  . Nonallopathic lesion of sacral region 11/16/2016  . Nonallopathic lesion of thoracic region 08/20/2014  . Nonspecific chest pain 04/28/2017  . OSA (obstructive sleep apnea) 01/02/2013   NPSG 2009:  AHI 9/hr. CPAP intolerance >> "smothering" Good tolerance of auto device (optimal pressure 12-13 on download).  - referred to Dr Clance    . OSA on CPAP   . Ovarian cyst    1999; surgically removed  . Patellofemoral syndrome of both knees 10/16/2016  . 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 PVC (premature ventricular contraction) 06/23/2016  . Seizures (HCC)    "as a child" (08/03/2016)  . Termination of pregnancy    due to cardiac risk   Past Surgical History:  Procedure Laterality Date  . CARDIAC CATHETERIZATION N/A 11/11/2015   Procedure: Right/Left Heart Cath and Coronary Angiography;  Surgeon: Thomas A Kelly, MD;  Location: [facility];  Service: Cardiovascular;  Laterality: N/A;  . CARDIAC CATHETERIZATION  ~ 2015  . CARDIAC DEFIBRILLATOR PLACEMENT  08/03/2016  . CESAREAN SECTION  1999  . COLONOSCOPY WITH PROPOFOL N/A 04/21/2016   Procedure: COLONOSCOPY WITH PROPOFOL;  Surgeon: Jay M Pyrtle, MD;  Location: [facility];  Service: Gastroenterology;  Laterality: N/A;  . ESOPHAGOGASTRODUODENOSCOPY (EGD) WITH PROPOFOL N/A 04/21/2016     Procedure: ESOPHAGOGASTRODUODENOSCOPY (EGD) WITH PROPOFOL;  Surgeon: Jay M Pyrtle, MD;  Location: [facility];  Service: Gastroenterology;  Laterality: N/A;  . FOOT FRACTURE SURGERY Right ~ 2003  . FRACTURE SURGERY    . ICD IMPLANT N/A 08/03/2016   Procedure: ICD Implant;  Surgeon: Steven C Klein, MD;  Location: [facility];  Service: Cardiovascular;  Laterality: N/A;  . LAPAROSCOPIC CHOLECYSTECTOMY  12/2006  . LAPAROSCOPIC GASTRIC SLEEVE RESECTION    . LAPAROSCOPY ABDOMEN DIAGNOSTIC  2008     "cut bile duct w/gallbladder OR; had to go in later & fix leak; hospitalized for 2 months"  . LEFT HEART CATHETERIZATION WITH CORONARY ANGIOGRAM N/A 02/26/2014   Procedure: LEFT HEART CATHETERIZATION WITH CORONARY ANGIOGRAM;  Surgeon: Jayadeep S Varanasi, MD;  Location: [facility];  Service: Cardiovascular;  Laterality: N/A;  . OVARIAN CYST REMOVAL Right 1999  . PVC ABLATION N/A 12/01/2017   Procedure: PVC ABLATION;  Surgeon: Taylor, Gregg W, MD;  Location: [facility];  Service: Cardiovascular;  Laterality: N/A;  . RIGHT HEART CATH N/A 08/05/2017   Procedure: RIGHT HEART CATH;  Surgeon: Bensimhon, Daniel R, MD;  Location: [facility];  Service: Cardiovascular;  Laterality: N/A;  . TUBAL LIGATION  1999   Social History   Socioeconomic History  . Marital status: Married    Spouse name: Not on file  . Number of children: 1  . Years of education: Not on file  . Highest education level: Not on file  Occupational History  . Occupation: stay at home mom  Social Needs  . Financial resource strain: Not on file  . Food insecurity:    Worry: Not on file    Inability: Not on file  . Transportation needs:    Medical: Not on file    Non-medical: Not on file  Tobacco Use  . Smoking status: Never Smoker  . Smokeless tobacco: Never Used  Substance and Sexual Activity  . Alcohol use: No  . Drug use: No  . Sexual activity: Yes  Lifestyle  . Physical activity:    Days per week: Not on file    Minutes per session: Not on file  . Stress: Not on file  Relationships  . Social connections:    Talks on phone: Not on file    Gets together: Not on file    Attends religious service: Not on file    Active member of club or organization: Not on file    Attends meetings of clubs or organizations: Not on file    Relationship status: Not on file  Other Topics Concern  . Not on file  Social History Narrative  . Not on file   Allergies  Allergen Reactions  . Aspirin Anaphylaxis and  Other (See Comments)    Wheezing, "but patient still takes if she needs to"  . Vancomycin Other (See Comments)    "did something to my kidneys," PROGRESSED TO KIDNEY FAILURE!!  . Contrast Media [Iodinated Diagnostic Agents] Other (See Comments)    Multiple CT contrast studies done over 2 weeks caused ARF  . Avocado Itching and Swelling  . Ciprofloxacin Itching and Rash  . Sulfa Antibiotics Itching and Rash   Family History  Problem Relation Age of Onset  . Emphysema Maternal Grandmother        smoked  . Heart disease Maternal Grandmother 45       MI  . Rheum arthritis Mother   . Allergies Daughter   . Colon   cancer Neg Hx      Current Outpatient Medications (Cardiovascular):  .  amiodarone (PACERONE) 200 MG tablet, Take 1 tablet (200 mg total) by mouth 2 (two) times daily. .  colestipol (COLESTID) 1 g tablet, Take 2 tablets (2 g total) by mouth 2 (two) times daily. .  digoxin (LANOXIN) 0.125 MG tablet, Take 1 tablet (0.125 mg total) by mouth daily. .  ivabradine (CORLANOR) 7.5 MG TABS tablet, TAKE 1 TABLET (7.5 MG TOTAL) BY MOUTH 2 (TWO) TIMES DAILY WITH A MEAL. .  nitroGLYCERIN (NITROSTAT) 0.4 MG SL tablet, PLACE 1 TABLET (0.4 MG TOTAL) UNDER THE TONGUE EVERY 5 (FIVE) MINUTES AS NEEDED FOR CHEST PAIN. .  sacubitril-valsartan (ENTRESTO) 49-51 MG, Take 1 tablet by mouth 2 (two) times daily. .  spironolactone (ALDACTONE) 25 MG tablet, Take 1 tablet (25 mg total) by mouth daily. .  torsemide (DEMADEX) 20 MG tablet, Take 2 tablets (40 mg total) by mouth daily.  Current Outpatient Medications (Respiratory):  .  albuterol (PROVENTIL HFA;VENTOLIN HFA) 108 (90 Base) MCG/ACT inhaler, INHALE 1-2 PUFFS INTO THE LUNGS EVERY 6 (SIX) HOURS AS NEEDED FOR WHEEZING OR SHORTNESS OF BREATH. .  fluticasone (FLONASE) 50 MCG/ACT nasal spray, Place 2 sprays into both nostrils daily as needed for allergies. .  SYMBICORT 160-4.5 MCG/ACT inhaler, INHALE 2 PUFFS INTO THE LUNGS 2 (TWO) TIMES DAILY AS  NEEDED.  Current Outpatient Medications (Analgesics):  .  Ibuprofen-Famotidine 800-26.6 MG TABS, Take 1 tablet by mouth 3 (three) times daily as needed.   Current Outpatient Medications (Other):  .  blood glucose meter kit and supplies, Dispense based on patient and insurance preference. Use up to two times daily as directed. (FOR ICD-10 E10.9, E11.9). .  Diclofenac Sodium 2 % SOLN, Place 2 g onto the skin 2 (two) times daily. .  dicyclomine (BENTYL) 20 MG tablet, Take 20 mg by mouth 2 (two) times daily as needed for spasms. .  gabapentin (NEURONTIN) 100 MG capsule, Take 200 mg by mouth at bedtime as needed (for pain).  .  lidocaine (LIDODERM) 5 %, Place 1 patch onto the skin daily. Remove & Discard patch within 12 hours or as directed by MD .  linaclotide (LINZESS) 145 MCG CAPS capsule, Take 1 capsule (145 mcg total) by mouth daily before breakfast. .  Multiple Vitamins-Minerals (MULTIVITAMIN GUMMIES WOMENS PO), Take 2 each by mouth daily. .  omeprazole (PRILOSEC) 20 MG capsule, Take 20 mg by mouth daily. .  potassium chloride SA (K-DUR,KLOR-CON) 20 MEQ tablet, Take 1 tablet (20 mEq total) by mouth daily. Take extra tablet on days you take Torsemide. .  Vitamin D, Ergocalciferol, (DRISDOL) 50000 units CAPS capsule, TAKE 1 CAPSULE (50,000 UNITS TOTAL) BY MOUTH EVERY 7 (SEVEN) DAYS. .  zolpidem (AMBIEN) 5 MG tablet, Take 1 tablet (5 mg total) by mouth at bedtime as needed for sleep.    Past medical history, social, surgical and family history all reviewed in electronic medical record.  No pertanent information unless stated regarding to the chief complaint.   Review of Systems:  No headache, visual changes, nausea, vomiting, diarrhea, constipation, dizziness, abdominal pain, skin rash, fevers, chills, night sweats, weight loss, swollen lymph nodes, body aches, joint swelling, muscle aches, chest pain, shortness of breath, mood changes.   Objective  There were no vitals taken for this  visit. Systems examined below as of    General: No apparent distress alert and oriented x3 mood and affect normal, dressed appropriately.  HEENT: Pupils equal, extraocular movements intact    Respiratory: [name] speak in full sentences and does not appear short of breath  Cardiovascular: No lower extremity edema, non tender, no erythema  Skin: Warm dry intact with no signs of infection or rash on extremities or on axial skeleton.  Abdomen: Soft nontender  Neuro: Cranial nerves II through XII are intact, neurovascularly intact in all extremities with 2+ DTRs and 2+ pulses.  Lymph: No lymphadenopathy of posterior or anterior cervical chain or axillae bilaterally.  Gait normal with good balance and coordination.  MSK:  Non tender with full range of motion and good stability and symmetric strength and tone of shoulders, elbows, wrist, hip, and ankles bilaterally.  Knee: Left Normal to inspection with no erythema or effusion or obvious bony abnormalities. Mild tenderness over the patella mostly laterally ROM full in flexion and extension and lower leg rotation. Ligaments with solid consistent endpoints including ACL, PCL, LCL, MCL. Negative Mcmurray's, Apley's, and Thessalonian tests. painful patellar compression. Patellar glide without crepitus. Patellar and quadriceps tendons unremarkable. Hamstring and quadriceps strength is normal.  Back Exam:  Inspection: Unremarkable  Motion: Flexion 35 deg, Extension 25 deg, Side Bending to 25 deg bilaterally,  Rotation to 35 deg bilaterally  SLR laying: Negative  XSLR laying: Negative  Palpable tenderness: Tender to palpation the paraspinal musculature lumbar spine right greater than left.  Also some tightness in the paraspinal musculature on the right side of the scapula FABER: Positive Faber. Sensory change: Gross sensation intact to all lumbar and sacral dermatomes.  Reflexes: 2+ at both patellar tendons, 2+ at achilles tendons, Babinski's  downgoing.  Strength at foot  Plantar-flexion: 5/5 Dorsi-flexion: 5/5 Eversion: 5/5 Inversion: 5/5  Leg strength  Quad: 5/5 Hamstring: 5/5 Hip flexor: 5/5 Hip abductors: 5/5  Gait unremarkable.  Osteopathic findings C4 flexed rotated and side bent left C7 flexed rotated and side bent left T3 extended rotated and side bent right inhaled third rib T7 extended rotated and side bent left L3 flexed rotated and side bent right Sacrum right on right  After verbal consent patient was prepped with alcohol swabs and with a 25-gauge half inch needle injected into 4 distinct trigger points in the right shoulder greater girdle region.  Total of 3 cc of 0.5% Marcaine and 1 cc of Kenalog 40 mg/mL used minimal blood loss.  Band-Aids placed.  Postinjection instructions given    Impression and Recommendations:     This case required medical decision making of moderate complexity. The above documentation has been reviewed and is accurate and complete Zachary M Smith, DO       Note: This dictation was prepared with Dragon dictation along with smaller phrase technology. Any transcriptional errors that result from this process are unintentional.         

## 2018-06-13 NOTE — Patient Instructions (Addendum)
Good to see you  [name] is your friend Stay active Keep it up  Injected trigger points See me again in 6 weeks I am proud of you!

## 2018-06-13 NOTE — Progress Notes (Signed)
EPIC Encounter for ICM Monitoring  Patient Name: [name] is a 43 y.o. female Date: 06/13/2018 Primary Care Physican: Hoyt Koch, MD Primary Cardiologist: Smith/Bensimhon Electrophysiologist: Caryl Comes LastWeight:195.6lbs Today's Weight: unknowns                                                               Attempted call to patient.  Left detailed message per DPR regarding transmission. Transmission reviewed.    [employer] Heart Failure Index is 8 suggesting returning to normal (decreased from 29 on 05/30/2018).   Prescribed: Torsemide20 mgTake 2 tablets (40 mg total) by mouth daily.   Labs: 05/19/2018 Creatinine 0.76, BUN >5, Potassium 3.7, Sodium 139, eGFR >60 01/14/2018 Creatinine 0.90, BUN 12, Potassium 3.7, Sodium 139, eGFR 87.56 11/24/2017 Creatinine1.02, BUN12, Potassium3.9, Sodium137, EGFR>60 11/23/2017 Creatinine1.08, BUN11, Potassium3.5, Sodium138, EGFR>60  11/22/2017 Creatinine0.84, BUN8, Potassium 3.3, Sodium140, EGFR>60  A complete set of results can be found in Results Review  Recommendations:  Left voice mail with ICM number and encouraged to call if experiencing any fluid symptoms.     Follow-up plan: ICM clinic phone appointment on 07/11/2018.  Office appointment with HF clinic NP/PA 06/27/2018 and Dr Haroldine Laws 07/20/2018.        Copy of ICM check sent to Dr. Caryl Comes.      [name], RN 06/13/2018 10:19 AM

## 2018-06-13 NOTE — Assessment & Plan Note (Signed)
Decision today to treat with OMT was based on Physical Exam  After verbal consent patient was treated with HVLA, ME, FPR techniques in cervical, thoracic, rib,  lumbar and sacral areas  Patient tolerated the procedure well with improvement in symptoms  Patient given exercises, stretches and lifestyle modifications  See medications in patient instructions if given  Patient will follow up in 4-8 weeks 

## 2018-06-13 NOTE — Assessment & Plan Note (Signed)
Patient given injection today.  Tolerated the procedure well.  Discussed icing regimen and home exercise.  Discussed which activities to do which wants to avoid.  Patient is to increase activity slowly.  Follow-up again in 4 to 8 weeks.

## 2018-06-13 NOTE — Assessment & Plan Note (Signed)
Subacute syndrome.  Patient has had trigger point injections previously and responded well to this.  Attempted osteopathic manipulation and doing well as well.  Discussed icing regimen and home exercise.  Follow-up again in 4 to 8 weeks

## 2018-06-16 ENCOUNTER — Ambulatory Visit (INDEPENDENT_AMBULATORY_CARE_PROVIDER_SITE_OTHER): Payer: Medicare Other

## 2018-06-16 ENCOUNTER — Telehealth: Payer: Self-pay

## 2018-06-16 DIAGNOSIS — I428 Other cardiomyopathies: Secondary | ICD-10-CM

## 2018-06-16 NOTE — Telephone Encounter (Signed)
Left message for patient to remind of missed remote transmission.  

## 2018-06-17 ENCOUNTER — Encounter: Payer: Self-pay | Admitting: Cardiology

## 2018-06-17 NOTE — Progress Notes (Signed)
Remote ICD transmission.   

## 2018-06-17 NOTE — Addendum Note (Signed)
Encounter addended by: Conrad [place], NP on: 06/17/2018 11:48 AM  Actions taken: LOS modified

## 2018-06-19 LAB — CUP PACEART REMOTE DEVICE CHECK
Date Time Interrogation Session: 20200126174327
Implantable Lead Implant Date: 20180312
Implantable Lead Location: 753860
Implantable Lead Model: 293
Implantable Lead Serial Number: 422842
Implantable Pulse Generator Implant Date: 20180312
Pulse Gen Serial Number: 226601

## 2018-06-20 ENCOUNTER — Other Ambulatory Visit: Payer: Self-pay | Admitting: Internal Medicine

## 2018-06-20 DIAGNOSIS — J00 Acute nasopharyngitis [common cold]: Secondary | ICD-10-CM

## 2018-06-20 DIAGNOSIS — J029 Acute pharyngitis, unspecified: Secondary | ICD-10-CM

## 2018-06-23 ENCOUNTER — Ambulatory Visit (INDEPENDENT_AMBULATORY_CARE_PROVIDER_SITE_OTHER): Payer: Medicare Other | Admitting: Internal Medicine

## 2018-06-23 ENCOUNTER — Other Ambulatory Visit (HOSPITAL_COMMUNITY): Payer: Self-pay | Admitting: Cardiology

## 2018-06-23 ENCOUNTER — Encounter: Payer: Self-pay | Admitting: Internal Medicine

## 2018-06-23 ENCOUNTER — Ambulatory Visit (INDEPENDENT_AMBULATORY_CARE_PROVIDER_SITE_OTHER)
Admission: RE | Admit: 2018-06-23 | Discharge: 2018-06-23 | Disposition: A | Discharge status: Home / Self Care | Payer: Medicare Other | Source: Ambulatory Visit | Attending: Internal Medicine | Admitting: Internal Medicine

## 2018-06-23 VITALS — BP 130/84 | HR 46 | Temp 97.9°F | Ht 65.0 in | Wt 196.0 lb

## 2018-06-23 DIAGNOSIS — G8929 Other chronic pain: Secondary | ICD-10-CM

## 2018-06-23 DIAGNOSIS — M545 Low back pain: Secondary | ICD-10-CM

## 2018-06-23 MED ORDER — DICLOFENAC SODIUM 1 % TD GEL: 4.0000 g | Freq: Four times a day (QID) | TRANSDERMAL | 6 refills | Status: DC

## 2018-06-23 NOTE — Progress Notes (Signed)
   Subjective:   Patient ID: [name], female    DOB: 21-Jun-1975, 43 y.o.   MRN: [id number]  HPI The patient is a 43 YO female coming in for right low back/hip pain for 5-6 months. Saw sports medicine and had adjustment which did help temporarily (about 10 days ago) but is hurting again. She had tenderness to touch in that area. She also has some pain with bending. No pain with walking. Denies injury at the onset. She does lifting and suspects that she could have strained it. Has tried lidoderm patch which did not help and tried duexis and they did not help either. She has used pennsaid and this did help some although it did not last very long. Denies numbness or weakness in her legs. Rare pain going to the right upper leg. Denies change in bowel or bladder.   Review of Systems  Constitutional: Positive for activity change. Negative for appetite change, chills, fatigue, fever and unexpected weight change.  Respiratory: Negative.   Cardiovascular: Negative.   Gastrointestinal: Negative.   Musculoskeletal: Positive for arthralgias, back pain and myalgias. Negative for gait problem and joint swelling.  Skin: Negative.   Neurological: Negative.     Objective:  Physical Exam Constitutional:      Appearance: She is well-developed.  HENT:     Head: Normocephalic and atraumatic.  Neck:     Musculoskeletal: Normal range of motion.  Cardiovascular:     Rate and Rhythm: Normal rate and regular rhythm.  Pulmonary:     Effort: Pulmonary effort is normal. No respiratory distress.     Breath sounds: Normal breath sounds. No wheezing or rales.  Abdominal:     General: Bowel sounds are normal. There is no distension.     Palpations: Abdomen is soft.     Tenderness: There is no abdominal tenderness. There is no rebound.  Musculoskeletal:        General: Tenderness present.     Comments: Right SI area with tenderness to touch and going around to the lateral thigh.   Skin:    General: Skin is  warm and dry.  Neurological:     Mental Status: She is alert and oriented to person, place, and time.     Coordination: Coordination normal.     Vitals:   06/23/18 1500  BP: 130/84  Pulse: (!) 46  Temp: 97.9 F (36.6 C)  TempSrc: Oral  SpO2: 99%  Weight: 196 lb (88.9 kg)  Height: 5\' 5"  (1.651 m)    Assessment & Plan:

## 2018-06-23 NOTE — Patient Instructions (Signed)
We are checking the x-ray today.

## 2018-06-24 DIAGNOSIS — M545 Low back pain, unspecified: Secondary | ICD-10-CM | POA: Insufficient documentation

## 2018-06-24 DIAGNOSIS — G8929 Other chronic pain: Secondary | ICD-10-CM | POA: Insufficient documentation

## 2018-06-24 NOTE — Assessment & Plan Note (Signed)
Rx for voltaren gel and getting x-ray lumbar to evaluate.

## 2018-06-27 ENCOUNTER — Encounter (HOSPITAL_COMMUNITY): Payer: Self-pay | Admitting: *Deleted

## 2018-06-27 ENCOUNTER — Other Ambulatory Visit: Payer: Self-pay

## 2018-06-27 ENCOUNTER — Ambulatory Visit (HOSPITAL_COMMUNITY)
Admission: RE | Admit: 2018-06-27 | Discharge: 2018-06-27 | Disposition: A | Discharge status: Home / Self Care | Payer: Medicare Other | Source: Ambulatory Visit | Attending: Internal Medicine | Admitting: Internal Medicine

## 2018-06-27 ENCOUNTER — Encounter (HOSPITAL_COMMUNITY): Payer: Self-pay

## 2018-06-27 VITALS — BP 128/86 | HR 56 | Wt 195.4 lb

## 2018-06-27 DIAGNOSIS — Z9884 Bariatric surgery status: Secondary | ICD-10-CM | POA: Insufficient documentation

## 2018-06-27 DIAGNOSIS — I10 Essential (primary) hypertension: Secondary | ICD-10-CM

## 2018-06-27 DIAGNOSIS — R008 Other abnormalities of heart beat: Secondary | ICD-10-CM | POA: Insufficient documentation

## 2018-06-27 DIAGNOSIS — I110 Hypertensive heart disease with heart failure: Secondary | ICD-10-CM | POA: Insufficient documentation

## 2018-06-27 DIAGNOSIS — Z886 Allergy status to analgesic agent status: Secondary | ICD-10-CM | POA: Diagnosis not present

## 2018-06-27 DIAGNOSIS — I5042 Chronic combined systolic (congestive) and diastolic (congestive) heart failure: Secondary | ICD-10-CM | POA: Diagnosis not present

## 2018-06-27 DIAGNOSIS — Z8249 Family history of ischemic heart disease and other diseases of the circulatory system: Secondary | ICD-10-CM | POA: Insufficient documentation

## 2018-06-27 DIAGNOSIS — Z95810 Presence of automatic (implantable) cardiac defibrillator: Secondary | ICD-10-CM | POA: Diagnosis not present

## 2018-06-27 DIAGNOSIS — Z79899 Other long term (current) drug therapy: Secondary | ICD-10-CM | POA: Insufficient documentation

## 2018-06-27 DIAGNOSIS — E119 Type 2 diabetes mellitus without complications: Secondary | ICD-10-CM | POA: Insufficient documentation

## 2018-06-27 DIAGNOSIS — Z6832 Body mass index (BMI) 32.0-32.9, adult: Secondary | ICD-10-CM | POA: Insufficient documentation

## 2018-06-27 DIAGNOSIS — Z91018 Allergy to other foods: Secondary | ICD-10-CM | POA: Diagnosis not present

## 2018-06-27 DIAGNOSIS — G4733 Obstructive sleep apnea (adult) (pediatric): Secondary | ICD-10-CM | POA: Diagnosis not present

## 2018-06-27 DIAGNOSIS — Z836 Family history of other diseases of the respiratory system: Secondary | ICD-10-CM | POA: Diagnosis not present

## 2018-06-27 DIAGNOSIS — Z882 Allergy status to sulfonamides status: Secondary | ICD-10-CM | POA: Diagnosis not present

## 2018-06-27 DIAGNOSIS — I493 Ventricular premature depolarization: Secondary | ICD-10-CM | POA: Diagnosis not present

## 2018-06-27 DIAGNOSIS — J45909 Unspecified asthma, uncomplicated: Secondary | ICD-10-CM | POA: Diagnosis not present

## 2018-06-27 DIAGNOSIS — Z881 Allergy status to other antibiotic agents status: Secondary | ICD-10-CM | POA: Diagnosis not present

## 2018-06-27 DIAGNOSIS — F419 Anxiety disorder, unspecified: Secondary | ICD-10-CM | POA: Insufficient documentation

## 2018-06-27 DIAGNOSIS — F329 Major depressive disorder, single episode, unspecified: Secondary | ICD-10-CM | POA: Diagnosis not present

## 2018-06-27 DIAGNOSIS — I428 Other cardiomyopathies: Secondary | ICD-10-CM | POA: Insufficient documentation

## 2018-06-27 MED ORDER — SACUBITRIL-VALSARTAN 97-103 MG PO TABS: 1.0000 | ORAL_TABLET | Freq: Two times a day (BID) | ORAL | 6 refills | Status: DC

## 2018-06-27 MED ORDER — TORSEMIDE 20 MG PO TABS: 40.0000 mg | ORAL_TABLET | Freq: Every day | ORAL | 0 refills | Status: DC

## 2018-06-27 NOTE — Progress Notes (Signed)
Patient referred to Clinical Exercise Physiologist by Darrick Grinder, NP for guidance and discussion about safe home exercises and/or starting an exercise program. Patient stated she completed cardiac rehab 3x and has also enrolled in 2 separate sessions without full completion of the 12 weeks due to other medical interruptions. Patient expressed her desire for more direction in improving her endurance and strength post-gastric procedure and weight loss. Provided guidance for initiation of exercise with guidance of progression. Exercises were demonstrated and detailed with safety precautions to patient and accompanying caregiver (if present). Patient was presented with an information packet including demonstrations of the exercises discussed. All patient's questions were answered and patient was given contact information for further questions or concerns regarding their exercise. Will follow-up with Exercise Physiologist as needed.     Landis Martins, MS, ACSM-RCEP Clinical Exercise Physiologist

## 2018-06-27 NOTE — Patient Instructions (Signed)
Labs will need to be done in 1 week.  DECREASE Torsemide 40mg  (2 tabs) daily  INCREASE Entresto 97-103 (1 tab) twice daily  You have been referred to Prep program with the [facility]. They will contact you in order to set up an appointment.   Follow up with the Advanced Practice Provider in 3 weeks.

## 2018-06-27 NOTE — Progress Notes (Signed)
Advanced Heart Failure Clinic Note Date:  06/27/2018  ID:  [name], DOB Feb 28, 1976, MRN [id number]  PCP:  Hoyt Koch, MD  Cardiologist: Pernell Dupre, MD HF: Dr Haroldine Laws  [name] is a 43 y.o. 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   Was admitted 12/5-11/2016 with recurrent HF, URI, CP and elevated BP (160/110). CT chest no PE. Diuresed from 233-> 228 pounds.   Admitted 11/2017 with CP described as "pounding". Troponins were negative. Symptoms thought to be related to frequent PVCs. EP consulted and scheduled her for PVC ablation. Echo was repeated 11/22/17 and showed improved EF 50-55%. HF team consulted and changed torsemide to 40 mg daily (from 3x/week).   Underwent PVC ablation 12/01/17. Was felt not to be successful. Now on flecainide.   Today she returns for HF follow up. Overall feeling fine. Main complaint is knee pain. Denies SOB/PND/Orthopnea. Appetite ok. No fever or chills. Weight at home down to 290 pounds. Taking all medications. Wants to start exercising.    ICD interrogated: Heart Logic score 0 . Activity 0.7 hours per day   Echo done 05/19/2018 by Dr Haroldine Laws --. EF back down to 20-25%  Cardiac Testing.  Cath 6/17 revealed normal vessels, moderate elevation in pulmonary artery pressures, and LVEF 25-30%.  [place] 3/19: RA = 2 RV = 26/7 PA = 28/13 (19) PCW = 5 Fick cardiac output/index = 5.7/2.8 PVR = 2.5 WU Ao sat = 99% PA sat = 71%, 72% High SVC sat = 65% RHC 6/17 RA 5 RV 35/6 PA 41/12 PW 11 Fick 6.4/3.0 PA sat 62%  Echo 6/15 35-40% Echo 6/17 25-30% Echo 12/17 20-25% Echo 8/18 EF 25% Echo 7/19: EF 50-55%  CPX 2/18 FVC 2.57 (79%)    FEV1 2.14 (81%)       FEV1/FVC 83 (101%)     MVV 107 (102%) Resting HR: 106 Peak HR: 166  (93% age predicted max HR) BP rest: 136/98 BP peak: 174/88 Peak VO2: 17.1 (85% predicted peak VO2) - corrects to 28.4 for ibw VE/VCO2 slope: 33 OUES: 2.16 Peak RER: 1.09 Ventilatory Threshold: 14.6 (73% predicted or measured peak VO2) VE/MVV: 59% PETCO2 at peak: 33 O2pulse: 10  (83% predicted O2pulse)  CPX 12/18 pVO2 14.0 (corrects to 23.0 for IBW) VeVCO2  32 RER 1.0  Review of systems complete and found to be negative unless listed in HPI.   Past Medical History:  Diagnosis Date  . Acute on chronic systolic (congestive) heart failure ([place]) 04/29/2017  . Acute pain of right shoulder 06/16/2017  . AICD (automatic cardioverter/defibrillator) present   . Anxiety   . Arthritis    right shoulder   . Asthma   . CHF (congestive heart failure) ([place])   . Chronic combined systolic and diastolic heart failure ([place]) 03/05/2014  . Closed low lateral malleolus fracture 10/23/2013  . Cystitis 10/21/2017  . Depression   . Depression with anxiety 01/20/2013  . Diabetes mellitus without complication ([place])   . Diverticulosis   . Dyspnea    comes and goes intermittently mostly with exertion   . Essential hypertension    Prev followed by [name]/ Cardiology   . Fibroid    age 76  . Gallstones   . Generalized  abdominal cramping   . History of cardiomyopathy   . Hypertension   . IBS (irritable bowel syndrome)   . 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 Insomnia 05/07/2017  . Labral tear of shoulder 04/04/2015    Injected 04/04/2015 Injected 12/03/2015   . Migraine    "monthly" (08/03/2016)  . Myofascial pain 06/16/2017  . NICM (nonischemic cardiomyopathy) ([place]) 08/03/2016  . Nonallopathic lesion of lumbosacral region 11/16/2016  . Nonallopathic lesion of sacral region 11/16/2016  . Nonallopathic lesion of thoracic region 08/20/2014  . Nonspecific chest pain 04/28/2017  . OSA  (obstructive sleep apnea) 01/02/2013   NPSG 2009:  AHI 9/hr. CPAP intolerance >> "smothering" Good tolerance of auto device (optimal pressure 12-13 on download).  - referred to Dr Gwenette Greet    . OSA on CPAP   . Ovarian cyst    1999; surgically removed  . Patellofemoral syndrome of both knees 10/16/2016  . 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 PVC (premature ventricular contraction) 06/23/2016  . Seizures ([place])    "as a child" (08/03/2016)  . Termination of pregnancy    due to cardiac risk    Past Surgical History:  Procedure Laterality Date  . CARDIAC CATHETERIZATION N/A 11/11/2015   Procedure: Right/Left Heart Cath and Coronary Angiography;  Surgeon: Troy Sine, MD;  Location: [place] [facility];  Service: Cardiovascular;  Laterality: N/A;  . CARDIAC CATHETERIZATION  ~ 2015  . CARDIAC DEFIBRILLATOR PLACEMENT  08/03/2016  . CESAREAN SECTION  1999  . COLONOSCOPY WITH PROPOFOL N/A 04/21/2016   Procedure: COLONOSCOPY WITH PROPOFOL;  Surgeon: Jerene Bears, MD;  Location: [facility];  Service: Gastroenterology;  Laterality: N/A;  . ESOPHAGOGASTRODUODENOSCOPY (EGD) WITH PROPOFOL N/A 04/21/2016   Procedure: ESOPHAGOGASTRODUODENOSCOPY (EGD) WITH PROPOFOL;  Surgeon: Jerene Bears, MD;  Location: [facility];  Service: Gastroenterology;  Laterality: N/A;  . FOOT FRACTURE SURGERY Right ~ 2003  . FRACTURE SURGERY    . ICD IMPLANT N/A 08/03/2016   Procedure: ICD Implant;  Surgeon: Deboraha Sprang, MD;  Location: [place] [facility];  Service: Cardiovascular;  Laterality: N/A;  . LAPAROSCOPIC CHOLECYSTECTOMY  12/2006  . LAPAROSCOPIC GASTRIC SLEEVE RESECTION    . LAPAROSCOPY ABDOMEN DIAGNOSTIC  2008   "cut bile duct w/gallbladder OR; had to go in later & fix leak; hospitalized for 2 months"  . LEFT HEART CATHETERIZATION WITH CORONARY ANGIOGRAM N/A 02/26/2014   Procedure: LEFT HEART CATHETERIZATION WITH CORONARY ANGIOGRAM;  Surgeon: Jettie Booze, MD;  Location: [facility] CATH  LAB;  Service: Cardiovascular;  Laterality: N/A;  . OVARIAN CYST REMOVAL Right 1999  . PVC ABLATION N/A 12/01/2017   Procedure: PVC ABLATION;  Surgeon: Evans Lance, MD;  Location: [place] [facility] LAB;  Service: Cardiovascular;  Laterality: N/A;  . RIGHT HEART CATH N/A 08/05/2017   Procedure: RIGHT HEART CATH;  Surgeon: Jolaine Artist, MD;  Location: [place] [facility] LAB;  Service: Cardiovascular;  Laterality: N/A;  . TUBAL LIGATION  1999    Current Medications:  Current Meds  Medication Sig  . albuterol (PROVENTIL HFA;VENTOLIN HFA) 108 (90 Base) MCG/ACT inhaler INHALE 1-2 PUFFS INTO THE LUNGS EVERY 6 (SIX) HOURS AS NEEDED FOR WHEEZING OR SHORTNESS OF BREATH.  Marland Kitchen amiodarone (PACERONE) 200 MG tablet Take 200 mg by mouth daily.  . blood glucose meter kit and supplies Dispense based on patient and insurance preference. Use up to two times daily as directed. (FOR ICD-10 E10.9, E11.9).  . colestipol (COLESTID)  1 g tablet Take 2 tablets (2 g total) by mouth 2 (two) times daily.  . diclofenac sodium (VOLTAREN) 1 % GEL Apply 4 g topically 4 (four) times daily.  . Diclofenac Sodium 2 % SOLN Place 2 g onto the skin 2 (two) times daily.  Marland Kitchen dicyclomine (BENTYL) 20 MG tablet Take 20 mg by mouth 2 (two) times daily as needed for spasms.  . digoxin (LANOXIN) 0.125 MG tablet Take 1 tablet (0.125 mg total) by mouth daily.  . fluticasone (FLONASE) 50 MCG/ACT nasal spray PLACE 2 SPRAYS INTO BOTH NOSTRILS DAILY AS NEEDED FOR ALLERGIES.  Marland Kitchen gabapentin (NEURONTIN) 100 MG capsule Take 200 mg by mouth at bedtime as needed (for pain).   . Ibuprofen-Famotidine 800-26.6 MG TABS Take 1 tablet by mouth 3 (three) times daily as needed.  . ivabradine (CORLANOR) 7.5 MG TABS tablet TAKE 1 TABLET (7.5 MG TOTAL) BY MOUTH 2 (TWO) TIMES DAILY WITH A MEAL.  Marland Kitchen KLOR-CON M20 20 MEQ tablet TAKE 2 TABLETS (40 MEQ TOTAL) BY MOUTH DAILY.  Marland Kitchen lidocaine (LIDODERM) 5 % Place 1 patch onto the skin daily. Remove & Discard patch within 12  hours or as directed by MD  . linaclotide (LINZESS) 145 MCG CAPS capsule Take 1 capsule (145 mcg total) by mouth daily before breakfast.  . Multiple Vitamins-Minerals (MULTIVITAMIN GUMMIES WOMENS PO) Take 2 each by mouth daily.  Marland Kitchen omeprazole (PRILOSEC) 20 MG capsule Take 20 mg by mouth daily.  . sacubitril-valsartan (ENTRESTO) 49-51 MG Take 1 tablet by mouth 2 (two) times daily.  Marland Kitchen spironolactone (ALDACTONE) 25 MG tablet Take 1 tablet (25 mg total) by mouth daily.  . SYMBICORT 160-4.5 MCG/ACT inhaler INHALE 2 PUFFS INTO THE LUNGS 2 (TWO) TIMES DAILY AS NEEDED.  Marland Kitchen torsemide (DEMADEX) 20 MG tablet Take 2 tablets (40 mg total) by mouth daily.  . Vitamin D, Ergocalciferol, (DRISDOL) 50000 units CAPS capsule TAKE 1 CAPSULE (50,000 UNITS TOTAL) BY MOUTH EVERY 7 (SEVEN) DAYS.  Marland Kitchen zolpidem (AMBIEN) 5 MG tablet Take 1 tablet (5 mg total) by mouth at bedtime as needed for sleep.     Allergies:   Aspirin; Vancomycin; Contrast media [iodinated diagnostic agents]; Avocado; Ciprofloxacin; and Sulfa antibiotics   Social History   Socioeconomic History  . Marital status: Married    Spouse name: Not on file  . Number of children: 1  . Years of education: Not on file  . Highest education level: Not on file  Occupational History  . Occupation: stay at home mom  Social Needs  . Financial resource strain: Not on file  . Food insecurity:    Worry: Not on file    Inability: Not on file  . Transportation needs:    Medical: Not on file    Non-medical: Not on file  Tobacco Use  . Smoking status: Never Smoker  . Smokeless tobacco: Never Used  Substance and Sexual Activity  . Alcohol use: No  . Drug use: No  . Sexual activity: Yes  Lifestyle  . Physical activity:    Days per week: Not on file    Minutes per session: Not on file  . Stress: Not on file  Relationships  . Social connections:    Talks on phone: Not on file    Gets together: Not on file    Attends religious service: Not on file     Active member of club or organization: Not on file    Attends meetings of clubs or organizations: Not on file  Relationship status: Not on file  Other Topics Concern  . Not on file  Social History Narrative  . Not on file     Family History:  The patient's family history includes Allergies in her daughter; Emphysema in her maternal grandmother; Heart disease (age of onset: 26) in her maternal grandmother; Rheum arthritis in her mother.   Vitals:   06/27/18 1503  BP: 128/86  Pulse: (!) 56  SpO2: 98%  Weight: 88.6 kg (195 lb 6.4 oz)     Wt Readings from Last 3 Encounters:  06/27/18 88.6 kg (195 lb 6.4 oz)  06/23/18 88.9 kg (196 lb)  06/13/18 89.8 kg (198 lb)     Physical Exam General:  Well appearing. No resp difficulty HEENT: normal Neck: supple. no JVD. Carotids 2+ bilat; no bruits. No lymphadenopathy or thryomegaly appreciated. Cor: PMI nondisplaced. Regular rate & rhythm. No rubs, gallops or murmurs. Lungs: clear Abdomen: soft, nontender, nondistended. No hepatosplenomegaly. No bruits or masses. Good bowel sounds. Extremities: no cyanosis, clubbing, rash, edema Neuro: alert & orientedx3, cranial nerves grossly intact. moves all 4 extremities w/o difficulty. Affect pleasant    Assessment and Plan  1. Chronic systolic HF:  due to peri-partum CM, onset 1999. S/P [employer] ICD 07/2016 - EF 20-25% (12/17).  - Echo 8/18 EF 25-30% s/p ICD 3/18. [place] 3/19: RA 2, PA 28/13, Fick CO 5.7, CI 2.8 - CPX 12/18 showed moderate limitation due mostly to obesity with some HF component - Echo 11/22/17 LVEF 50-55%, Grade 1 DD, Mild MR, Mod LAE - Echo 04/2018 by Dr Haroldine Laws EF20-25%. RV ok - Cut  Back torsemide to 40 mg daily.   - Increase  Entresto 97-103 mg twice a day. .  - Off bb in 04/2018 due to suspected low output. Consider bb next visit.  - Continue Spiro 25 mg daily.  - Continue corlanor 7.78m BID for now.  - Continue digoxin 0.1248mdaily - BMET next visit.  2.  Morbid obesity [facility]  - Body mass index is 32.52 kg/m.  Referred to Exercise Physiologist today for exercise program. [facility]  3. OSA -Able to stop CPAP after most recent sleep study in June 2019.  4. Anxiety/depression - Per PCP. No change.  5. DM2 - A1c in Jan. 2018 is 5.3 with medications.  - Per PCP. Consider Jardiance.  6. Frequent PVCs/ Bigeminy - Holter Monitor 11/09/17 with 30% PVCs with one primary morphology.  - S/p PVC ablation 11/2017 with Dr TaLovena Lensuccessful - Off flecainide with EF down and persistent PVCs - Zio Patch results discussed. One short run NSVT with occasional PVCs - Continue amio 200 mg daily.    Follow up in 3 weeks with Dr BeHaroldine Laws  AmDarrick GrinderNP  06/27/2018 3:22 PM

## 2018-07-04 ENCOUNTER — Ambulatory Visit (HOSPITAL_COMMUNITY)
Admission: RE | Admit: 2018-07-04 | Discharge: 2018-07-04 | Disposition: A | Discharge status: Home / Self Care | Payer: Medicare Other | Source: Ambulatory Visit | Attending: Cardiology | Admitting: Cardiology

## 2018-07-04 DIAGNOSIS — I5042 Chronic combined systolic (congestive) and diastolic (congestive) heart failure: Secondary | ICD-10-CM | POA: Diagnosis not present

## 2018-07-04 LAB — BASIC METABOLIC PANEL
Anion gap: 7 (ref 5–15)
BUN: 10 mg/dL (ref 6–20)
CO2: 27 mmol/L (ref 22–32)
Calcium: 8.9 mg/dL (ref 8.9–10.3)
Chloride: 107 mmol/L (ref 98–111)
Creatinine, Ser: 0.98 mg/dL (ref 0.44–1.00)
GFR calc Af Amer: >60 mL/min (ref >60)
GFR calc non Af Amer: >60 mL/min (ref >60)
Glucose, Bld: 126 mg/dL — ABNORMAL HIGH (ref 70–99)
Potassium: 3.4 mmol/L — ABNORMAL LOW (ref 3.5–5.1)
Sodium: 141 mmol/L (ref 135–145)

## 2018-07-07 ENCOUNTER — Encounter (HOSPITAL_COMMUNITY): Payer: Self-pay

## 2018-07-11 ENCOUNTER — Ambulatory Visit (INDEPENDENT_AMBULATORY_CARE_PROVIDER_SITE_OTHER): Payer: Medicare Other

## 2018-07-11 DIAGNOSIS — I5042 Chronic combined systolic (congestive) and diastolic (congestive) heart failure: Secondary | ICD-10-CM | POA: Diagnosis not present

## 2018-07-11 DIAGNOSIS — Z95810 Presence of automatic (implantable) cardiac defibrillator: Secondary | ICD-10-CM

## 2018-07-12 NOTE — Progress Notes (Signed)
EPIC Encounter for ICM Monitoring  Patient Name: [name] is a 43 y.o. female Date: 07/12/2018 Primary Care Physican: Hoyt Koch, MD Primary Cardiologist: Smith/Bensimhon Electrophysiologist: Caryl Comes LastWeight:195.6lbs Today's Weight:unknown  Spoke with patient and she is feeling well.  Weight is stable but she is in [place] and does not know what the last exact weight was at home.   HeartLogic Heart Failure Index is0 suggesting normal.  Prescribed:Torsemide20 mgTake 2 tablets (40 mg total) by mouth daily.   Labs: 07/04/2018 Creatinine 0.98, BUN 10, Potassium 3.4, Sodium 141, GFR >60 06/06/2018 Creatinine 0.93, BUN 7,   Potassium 3.7, Sodium 138, GFR >60  05/27/2018 Creatinine 1.02, BUN 8,   Potassium 3.5, Sodium 137, GFR >60  05/19/2018 Creatinine 0.76, BUN >5, Potassium 3.7, Sodium 139, GFR >60 01/14/2018 Creatinine 0.90, BUN 12, Potassium 3.7, Sodium 139, GFR 87.56 11/24/2017 Creatinine1.02, BUN12, Potassium3.9, Sodium137, GFR>60 11/23/2017 Creatinine1.08, BUN11, Potassium3.5, Sodium138, GFR>60  11/22/2017 Creatinine0.84, BUN8, Potassium 3.3, Sodium140, GFR>60  A complete set of results can be found in Results Review  Recommendations: Encouraged to call for any fluid symptoms.  Follow-up plan: ICM clinic phone appointment on3/23/2020. Office appointment with Dr Haroldine Laws 07/20/2018.  Copy of ICM check sent to Dr. Caryl Comes.             [name], RN 07/12/2018 7:56 AM

## 2018-07-16 DIAGNOSIS — S29012A Strain of muscle and tendon of back wall of thorax, initial encounter: Secondary | ICD-10-CM | POA: Diagnosis not present

## 2018-07-16 DIAGNOSIS — I110 Hypertensive heart disease with heart failure: Secondary | ICD-10-CM | POA: Diagnosis not present

## 2018-07-16 DIAGNOSIS — S161XXA Strain of muscle, fascia and tendon at neck level, initial encounter: Secondary | ICD-10-CM | POA: Diagnosis not present

## 2018-07-16 DIAGNOSIS — M47812 Spondylosis without myelopathy or radiculopathy, cervical region: Secondary | ICD-10-CM | POA: Diagnosis not present

## 2018-07-16 DIAGNOSIS — I509 Heart failure, unspecified: Secondary | ICD-10-CM | POA: Diagnosis not present

## 2018-07-16 DIAGNOSIS — R40241 Glasgow coma scale score 13-15, unspecified time: Secondary | ICD-10-CM | POA: Diagnosis not present

## 2018-07-16 DIAGNOSIS — M47814 Spondylosis without myelopathy or radiculopathy, thoracic region: Secondary | ICD-10-CM | POA: Diagnosis not present

## 2018-07-16 DIAGNOSIS — M5030 Other cervical disc degeneration, unspecified cervical region: Secondary | ICD-10-CM | POA: Diagnosis not present

## 2018-07-16 DIAGNOSIS — S134XXA Sprain of ligaments of cervical spine, initial encounter: Secondary | ICD-10-CM | POA: Diagnosis not present
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
## 2018-07-19 ENCOUNTER — Encounter: Payer: Self-pay | Admitting: Family Medicine

## 2018-07-19 ENCOUNTER — Ambulatory Visit (INDEPENDENT_AMBULATORY_CARE_PROVIDER_SITE_OTHER): Payer: Medicare Other | Admitting: Family Medicine

## 2018-07-19 DIAGNOSIS — S134XXA Sprain of ligaments of cervical spine, initial encounter: Secondary | ICD-10-CM

## 2018-07-19 MED ORDER — TIZANIDINE HCL 4 MG PO TABS: 4.0000 mg | ORAL_TABLET | Freq: Every evening | ORAL | 2 refills | Status: DC

## 2018-07-19 MED ORDER — PREDNISONE 50 MG PO TABS: 50.0000 mg | ORAL_TABLET | Freq: Every day | ORAL | 0 refills | Status: DC

## 2018-07-19 NOTE — Patient Instructions (Signed)
Good to see you  Prednisone daily for 5 days Zanaflex as needed at night-muscle relaxer Ice 20 minutes every 4 hours See me again in 10-14 days
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
## 2018-07-20 ENCOUNTER — Other Ambulatory Visit: Payer: Self-pay | Admitting: *Deleted

## 2018-07-20 ENCOUNTER — Ambulatory Visit (HOSPITAL_COMMUNITY)
Admission: RE | Admit: 2018-07-20 | Discharge: 2018-07-20 | Disposition: A | Discharge status: Home / Self Care | Payer: Medicare Other | Source: Ambulatory Visit | Attending: Internal Medicine | Admitting: Internal Medicine

## 2018-07-20 VITALS — BP 112/62 | HR 50 | Wt 201.2 lb

## 2018-07-20 DIAGNOSIS — Z79899 Other long term (current) drug therapy: Secondary | ICD-10-CM | POA: Insufficient documentation

## 2018-07-20 DIAGNOSIS — Z881 Allergy status to other antibiotic agents status: Secondary | ICD-10-CM | POA: Insufficient documentation

## 2018-07-20 DIAGNOSIS — R008 Other abnormalities of heart beat: Secondary | ICD-10-CM | POA: Insufficient documentation

## 2018-07-20 DIAGNOSIS — Z9884 Bariatric surgery status: Secondary | ICD-10-CM | POA: Insufficient documentation

## 2018-07-20 DIAGNOSIS — G4700 Insomnia, unspecified: Secondary | ICD-10-CM | POA: Diagnosis not present

## 2018-07-20 DIAGNOSIS — E119 Type 2 diabetes mellitus without complications: Secondary | ICD-10-CM | POA: Insufficient documentation

## 2018-07-20 DIAGNOSIS — M19011 Primary osteoarthritis, right shoulder: Secondary | ICD-10-CM | POA: Insufficient documentation

## 2018-07-20 DIAGNOSIS — Z6833 Body mass index (BMI) 33.0-33.9, adult: Secondary | ICD-10-CM | POA: Diagnosis not present

## 2018-07-20 DIAGNOSIS — I110 Hypertensive heart disease with heart failure: Secondary | ICD-10-CM | POA: Insufficient documentation

## 2018-07-20 DIAGNOSIS — Z91041 Radiographic dye allergy status: Secondary | ICD-10-CM | POA: Insufficient documentation

## 2018-07-20 DIAGNOSIS — Z7952 Long term (current) use of systemic steroids: Secondary | ICD-10-CM | POA: Diagnosis not present

## 2018-07-20 DIAGNOSIS — G4733 Obstructive sleep apnea (adult) (pediatric): Secondary | ICD-10-CM | POA: Insufficient documentation

## 2018-07-20 DIAGNOSIS — Z886 Allergy status to analgesic agent status: Secondary | ICD-10-CM | POA: Diagnosis not present

## 2018-07-20 DIAGNOSIS — Z91018 Allergy to other foods: Secondary | ICD-10-CM | POA: Insufficient documentation

## 2018-07-20 DIAGNOSIS — I493 Ventricular premature depolarization: Secondary | ICD-10-CM

## 2018-07-20 DIAGNOSIS — I472 Ventricular tachycardia: Secondary | ICD-10-CM | POA: Diagnosis not present

## 2018-07-20 DIAGNOSIS — F418 Other specified anxiety disorders: Secondary | ICD-10-CM | POA: Insufficient documentation

## 2018-07-20 DIAGNOSIS — I10 Essential (primary) hypertension: Secondary | ICD-10-CM

## 2018-07-20 DIAGNOSIS — I5042 Chronic combined systolic (congestive) and diastolic (congestive) heart failure: Secondary | ICD-10-CM | POA: Diagnosis not present

## 2018-07-20 DIAGNOSIS — Z95810 Presence of automatic (implantable) cardiac defibrillator: Secondary | ICD-10-CM | POA: Diagnosis not present

## 2018-07-20 DIAGNOSIS — Z791 Long term (current) use of non-steroidal anti-inflammatories (NSAID): Secondary | ICD-10-CM | POA: Insufficient documentation

## 2018-07-20 DIAGNOSIS — I428 Other cardiomyopathies: Secondary | ICD-10-CM | POA: Insufficient documentation

## 2018-07-20 DIAGNOSIS — Z8249 Family history of ischemic heart disease and other diseases of the circulatory system: Secondary | ICD-10-CM | POA: Insufficient documentation

## 2018-07-20 DIAGNOSIS — J45909 Unspecified asthma, uncomplicated: Secondary | ICD-10-CM | POA: Insufficient documentation

## 2018-07-20 DIAGNOSIS — Z882 Allergy status to sulfonamides status: Secondary | ICD-10-CM | POA: Diagnosis not present

## 2018-07-20 DIAGNOSIS — Z8261 Family history of arthritis: Secondary | ICD-10-CM | POA: Insufficient documentation

## 2018-07-20 LAB — BASIC METABOLIC PANEL
Anion gap: 6 (ref 5–15)
BUN: 10 mg/dL (ref 6–20)
CO2: 24 mmol/L (ref 22–32)
Calcium: 8.7 mg/dL — ABNORMAL LOW (ref 8.9–10.3)
Chloride: 107 mmol/L (ref 98–111)
Creatinine, Ser: 0.83 mg/dL (ref 0.44–1.00)
GFR calc Af Amer: >60 mL/min (ref >60)
GFR calc non Af Amer: >60 mL/min (ref >60)
Glucose, Bld: 94 mg/dL (ref 70–99)
Potassium: 3.7 mmol/L (ref 3.5–5.1)
Sodium: 137 mmol/L (ref 135–145)

## 2018-07-20 MED ORDER — TIZANIDINE HCL 4 MG PO TABS: 4.0000 mg | ORAL_TABLET | Freq: Every evening | ORAL | 0 refills | Status: AC

## 2018-07-20 NOTE — Telephone Encounter (Signed)
Received fax from pharmacy confirming zanalfex 4mg  #30. Resent rx into pharmacy.

## 2018-07-20 NOTE — Progress Notes (Signed)
Advanced Heart Failure Clinic Note Date:  07/20/2018  ID:  [name], DOB 1976/04/24, MRN [id number]  PCP:  Hoyt Koch, MD  Cardiologist: Pernell Dupre, MD HF: Dr Haroldine Laws  [name] is a 43 y.o. 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   Was admitted 12/5-11/2016 with recurrent HF, URI, CP and elevated BP (160/110). CT chest no PE. Diuresed from 233-> 228 pounds.   Admitted 11/2017 with CP described as "pounding". Troponins were negative. Symptoms thought to be related to frequent PVCs. EP consulted and scheduled her for PVC ablation. Echo repeated 11/22/17 and showed improved EF 50-55%. HF team consulted and changed torsemide to 40 mg daily (from 3x/week).   Underwent PVC ablation 12/01/17. Was felt not to be successful. Now on flecainide.   She presents today for regular follow up. Last visit, Entresto increased. Feeling OK overall. Remains fatigued as primary complaint. Plans on exercising more in March as her insurance will start to cover [employer]. Just got back from travelling to [place] back and forth for family/grandfathers funeral. Knee pops when she goes down stairs, but otherwise is table with brace. Taking torsemide 40 mg daily. Hasn't needed extra in a long time.    ICD interrogated. Heart Logic score 0 last visit -> Up to 12. Thoracic impedence trending down. Sleep incline has improved. Activity level stable 1.5 - 2 hrs.   Echo done 05/19/2018 by Dr Haroldine Laws --. EF back down to 20-25% - Bedside echo by Dr. Haroldine Laws with EF improved to 50%.  Cardiac Testing.  Cath 6/17 revealed normal vessels, moderate elevation in pulmonary artery pressures, and LVEF 25-30%.  [place] 3/19: RA = 2 RV =  26/7 PA = 28/13 (19) PCW = 5 Fick cardiac output/index = 5.7/2.8 PVR = 2.5 WU Ao sat = 99% PA sat = 71%, 72% High SVC sat = 65% RHC 6/17 RA 5 RV 35/6 PA 41/12 PW 11 Fick 6.4/3.0 PA sat 62%  Echo 6/15 35-40% Echo 6/17 25-30% Echo 12/17 20-25% Echo 8/18 EF 25% Echo 7/19: EF 50-55%  CPX 2/18 FVC 2.57 (79%)    FEV1 2.14 (81%)     FEV1/FVC 83 (101%)     MVV 107 (102%) Resting HR: 106 Peak HR: 166  (93% age predicted max HR) BP rest: 136/98 BP peak: 174/88 Peak VO2: 17.1 (85% predicted peak VO2) - corrects to 28.4 for ibw VE/VCO2 slope: 33 OUES: 2.16 Peak RER: 1.09 Ventilatory Threshold: 14.6 (73% predicted or measured peak VO2) VE/MVV: 59% PETCO2 at peak: 33 O2pulse: 10  (83% predicted O2pulse)  CPX 12/18 pVO2 14.0 (corrects to 23.0 for IBW) VeVCO2  32 RER 1.0  Review of systems complete and found to be negative unless listed in HPI.    Past Medical History:  Diagnosis Date  . Acute on chronic systolic (congestive) heart failure ([place]) 04/29/2017  . Acute pain of right shoulder 06/16/2017  . AICD (automatic cardioverter/defibrillator) present   . Anxiety   . Arthritis    right shoulder   . Asthma   . CHF (congestive heart failure) ([place])   . Chronic combined systolic and diastolic heart failure ([place]) 03/05/2014  . Closed low lateral malleolus fracture 10/23/2013  . Cystitis 10/21/2017  . Depression   .  Depression with anxiety 01/20/2013  . Diabetes mellitus without complication ([place])   . Diverticulosis   . Dyspnea    comes and goes intermittently mostly with exertion   . Essential hypertension    Prev followed by [facility]/ Cardiology   . Fibroid    age 1  . Gallstones   . Generalized abdominal cramping   . History of cardiomyopathy   . Hypertension   . IBS (irritable bowel syndrome)   . 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 Insomnia 05/07/2017  . Labral tear of shoulder 04/04/2015    Injected 04/04/2015  Injected 12/03/2015   . Migraine    "monthly" (08/03/2016)  . Myofascial pain 06/16/2017  . NICM (nonischemic cardiomyopathy) ([place]) 08/03/2016  . Nonallopathic lesion of lumbosacral region 11/16/2016  . Nonallopathic lesion of sacral region 11/16/2016  . Nonallopathic lesion of thoracic region 08/20/2014  . Nonspecific chest pain 04/28/2017  . OSA (obstructive sleep apnea) 01/02/2013   NPSG 2009:  AHI 9/hr. CPAP intolerance >> "smothering" Good tolerance of auto device (optimal pressure 12-13 on download).  - referred to Dr Gwenette Greet    . OSA on CPAP   . Ovarian cyst    1999; surgically removed  . Patellofemoral syndrome of both knees 10/16/2016  . 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 PVC (premature ventricular contraction) 06/23/2016  . Seizures ([place])    "as a child" (08/03/2016)  . Termination of pregnancy    due to cardiac risk    Past Surgical History:  Procedure Laterality Date  . CARDIAC CATHETERIZATION N/A 11/11/2015   Procedure: Right/Left Heart Cath and Coronary Angiography;  Surgeon: Troy Sine, MD;  Location: [facility];  Service: Cardiovascular;  Laterality: N/A;  . CARDIAC CATHETERIZATION  ~ 2015  . CARDIAC DEFIBRILLATOR PLACEMENT  08/03/2016  . CESAREAN SECTION  1999  . COLONOSCOPY WITH PROPOFOL N/A 04/21/2016   Procedure: COLONOSCOPY WITH PROPOFOL;  Surgeon: Jerene Bears, MD;  Location: [facility];  Service: Gastroenterology;  Laterality: N/A;  . ESOPHAGOGASTRODUODENOSCOPY (EGD) WITH PROPOFOL N/A 04/21/2016   Procedure: ESOPHAGOGASTRODUODENOSCOPY (EGD) WITH PROPOFOL;  Surgeon: Jerene Bears, MD;  Location: [facility];  Service: Gastroenterology;  Laterality: N/A;  . FOOT FRACTURE SURGERY Right ~ 2003  . FRACTURE SURGERY    . ICD IMPLANT N/A 08/03/2016   Procedure: ICD Implant;  Surgeon: Deboraha Sprang, MD;  Location: [facility];  Service: Cardiovascular;  Laterality: N/A;  . LAPAROSCOPIC CHOLECYSTECTOMY  12/2006  . LAPAROSCOPIC  GASTRIC SLEEVE RESECTION    . LAPAROSCOPY ABDOMEN DIAGNOSTIC  2008   "cut bile duct w/gallbladder OR; had to go in later & fix leak; hospitalized for 2 months"  . LEFT HEART CATHETERIZATION WITH CORONARY ANGIOGRAM N/A 02/26/2014   Procedure: LEFT HEART CATHETERIZATION WITH CORONARY ANGIOGRAM;  Surgeon: Jettie Booze, MD;  Location: [facility] CATH LAB;  Service: Cardiovascular;  Laterality: N/A;  . OVARIAN CYST REMOVAL Right 1999  . PVC ABLATION N/A 12/01/2017   Procedure: PVC ABLATION;  Surgeon: Evans Lance, MD;  Location: [facility];  Service: Cardiovascular;  Laterality: N/A;  . RIGHT HEART CATH N/A 08/05/2017   Procedure: RIGHT HEART CATH;  Surgeon: Jolaine Artist, MD;  Location: [facility];  Service: Cardiovascular;  Laterality: N/A;  . TUBAL LIGATION  1999    Current Medications:  Current Meds  Medication Sig  . albuterol (PROVENTIL HFA;VENTOLIN HFA) 108 (90 Base) MCG/ACT inhaler INHALE 1-2 PUFFS  INTO THE LUNGS EVERY 6 (SIX) HOURS AS NEEDED FOR WHEEZING OR SHORTNESS OF BREATH.  Marland Kitchen amiodarone (PACERONE) 200 MG tablet Take 200 mg by mouth daily.  . blood glucose meter kit and supplies Dispense based on patient and insurance preference. Use up to two times daily as directed. (FOR ICD-10 E10.9, E11.9).  . colestipol (COLESTID) 1 g tablet Take 2 tablets (2 g total) by mouth 2 (two) times daily.  . diclofenac sodium (VOLTAREN) 1 % GEL Apply 4 g topically 4 (four) times daily.  . Diclofenac Sodium 2 % SOLN Place 2 g onto the skin 2 (two) times daily.  Marland Kitchen dicyclomine (BENTYL) 20 MG tablet Take 20 mg by mouth 2 (two) times daily as needed for spasms.  . digoxin (LANOXIN) 0.125 MG tablet Take 1 tablet (0.125 mg total) by mouth daily.  . fluticasone (FLONASE) 50 MCG/ACT nasal spray PLACE 2 SPRAYS INTO BOTH NOSTRILS DAILY AS NEEDED FOR ALLERGIES.  Marland Kitchen gabapentin (NEURONTIN) 100 MG capsule Take 200 mg by mouth at bedtime as needed (for pain).   . Ibuprofen-Famotidine 800-26.6 MG  TABS Take 1 tablet by mouth 3 (three) times daily as needed.  . ivabradine (CORLANOR) 7.5 MG TABS tablet TAKE 1 TABLET (7.5 MG TOTAL) BY MOUTH 2 (TWO) TIMES DAILY WITH A MEAL.  Marland Kitchen KLOR-CON M20 20 MEQ tablet TAKE 2 TABLETS (40 MEQ TOTAL) BY MOUTH DAILY.  Marland Kitchen lidocaine (LIDODERM) 5 % Place 1 patch onto the skin daily. Remove & Discard patch within 12 hours or as directed by MD  . linaclotide (LINZESS) 145 MCG CAPS capsule Take 1 capsule (145 mcg total) by mouth daily before breakfast.  . Multiple Vitamins-Minerals (MULTIVITAMIN GUMMIES WOMENS PO) Take 2 each by mouth daily.  . nitroGLYCERIN (NITROSTAT) 0.4 MG SL tablet PLACE 1 TABLET (0.4 MG TOTAL) UNDER THE TONGUE EVERY 5 (FIVE) MINUTES AS NEEDED FOR CHEST PAIN.  Marland Kitchen omeprazole (PRILOSEC) 20 MG capsule Take 20 mg by mouth daily.  . predniSONE (DELTASONE) 50 MG tablet Take 1 tablet (50 mg total) by mouth daily.  . sacubitril-valsartan (ENTRESTO) 97-103 MG Take 1 tablet by mouth 2 (two) times daily.  Marland Kitchen spironolactone (ALDACTONE) 25 MG tablet Take 1 tablet (25 mg total) by mouth daily.  . SYMBICORT 160-4.5 MCG/ACT inhaler INHALE 2 PUFFS INTO THE LUNGS 2 (TWO) TIMES DAILY AS NEEDED.  Marland Kitchen tiZANidine (ZANAFLEX) 4 MG tablet Take 1 tablet (4 mg total) by mouth Nightly for 10 days.  Marland Kitchen torsemide (DEMADEX) 20 MG tablet Take 2 tablets (40 mg total) by mouth daily.  . Vitamin D, Ergocalciferol, (DRISDOL) 50000 units CAPS capsule TAKE 1 CAPSULE (50,000 UNITS TOTAL) BY MOUTH EVERY 7 (SEVEN) DAYS.  Marland Kitchen zolpidem (AMBIEN) 5 MG tablet Take 1 tablet (5 mg total) by mouth at bedtime as needed for sleep.   Allergies:   Aspirin; Vancomycin; Contrast media [iodinated diagnostic agents]; Avocado; Ciprofloxacin; and Sulfa antibiotics   Social History   Socioeconomic History  . Marital status: Married    Spouse name: Not on file  . Number of children: 1  . Years of education: Not on file  . Highest education level: Not on file  Occupational History  . Occupation: stay at  home mom  Social Needs  . Financial resource strain: Not on file  . Food insecurity:    Worry: Not on file    Inability: Not on file  . Transportation needs:    Medical: Not on file    Non-medical: Not on file  Tobacco Use  .  Smoking status: Never Smoker  . Smokeless tobacco: Never Used  Substance and Sexual Activity  . Alcohol use: No  . Drug use: No  . Sexual activity: Yes  Lifestyle  . Physical activity:    Days per week: Not on file    Minutes per session: Not on file  . Stress: Not on file  Relationships  . Social connections:    Talks on phone: Not on file    Gets together: Not on file    Attends religious service: Not on file    Active member of club or organization: Not on file    Attends meetings of clubs or organizations: Not on file    Relationship status: Not on file  Other Topics Concern  . Not on file  Social History Narrative  . Not on file    Family History:  The patient's family history includes Allergies in her daughter; Emphysema in her maternal grandmother; Heart disease (age of onset: 98) in her maternal grandmother; Rheum arthritis in her mother.   Vitals:   07/20/18 1452  BP: 112/62  Pulse: (!) 50  SpO2: 98%  Weight: 91.3 kg (201 lb 3.2 oz)    Wt Readings from Last 3 Encounters:  07/20/18 91.3 kg (201 lb 3.2 oz)  07/19/18 88.5 kg (195 lb)  06/27/18 88.6 kg (195 lb 6.4 oz)    Physical Exam General: Well appearing. No resp difficulty. HEENT: Normal Neck: Supple. JVP 7-8 cm. Carotids 2+ bilat; no bruits. No thyromegaly or nodule noted. Cor: PMI nondisplaced. RRR, No M/G/R noted Lungs: CTAB, normal effort. Abdomen: Soft, non-tender, non-distended, no HSM. No bruits or masses. +BS  Extremities: No cyanosis, clubbing, or rash. R and LLE no edema.  Neuro: Alert & orientedx3, cranial nerves grossly intact. moves all 4 extremities w/o difficulty. Affect pleasant   Assessment and Plan  1. Chronic systolic HF:  due to peri-partum CM, onset  1999. S/P [employer] ICD 07/2016 - EF 20-25% (12/17).  - Echo 8/18 EF 25-30% s/p ICD 3/18. [place] 3/19: RA 2, PA 28/13, Fick CO 5.7, CI 2.8 - CPX 12/18 showed moderate limitation due mostly to obesity with some HF component - Echo 11/22/17 LVEF 50-55%, Grade 1 DD, Mild MR, Mod LAE - Echo 04/2018 by Dr Haroldine Laws EF20-25%. RV ok - Bedside echo by Dr. Haroldine Laws with EF improved to 50%. - Continue torsemide 40 mg daily.  Take extra 20 mg this evening vs tomorrow. Repeat as needed.  - Continue Entresto 97-103 mg BID - Off bb in 04/2018 due to suspected low output. Will not add now with improved EF.  - Continue Spiro 25 mg daily.  - Continue corlanor 7.70m BID for now.  - Stop digoxin with improvement of EF.  - BMET today. 2. [facility]  - Body mass index is 33.48 kg/m.  - She remains active. Has done CR multiple times.  3. OSA - She has been able to stop CPAP per sleep study in June 2019.  4. Anxiety/depression - Per PCP. No change.  5. DM2 - A1c in Jan. 2018 is 5.3 with medications.  - Per PCP. Can consider Jardiance.  6. Frequent PVCs/ Bigeminy - Holter Monitor 11/09/17 with 30% PVCs with one primary morphology.  - S/p PVC ablation 11/2017 with Dr TLovena Leunsuccessful - Off flecainide with EF down and persistent PVCs - Zio Patch 06/05/18 One short run NSVT with occasional PVCs - Continue amio 200 mg daily.  EF improved by bedside Echo. It is unclear what causes such a significant drop. EF has improved with medical therapy as above. RTC 4 months. Sooner with symptoms. BMET today with recent low K.   Shirley Friar, PA-C  07/20/2018 2:55 PM   Patient seen and examined with the above-signed Advanced Practice Provider and/or Housestaff. I personally reviewed laboratory data, imaging studies and relevant notes. I independently examined the patient and formulated the important aspects of the plan. I have edited the note to reflect any  of my changes or salient points. I have personally discussed the plan with the patient and/or family.  Repeat echo done personally at bedside. EF 50%. Volume status mildly elevated. NYHA II-III. Will stop digoxin. Reinforced need for daily weights and reviewed use of sliding scale diuretics. Will see back in several months.   Glori Bickers, MD  3:46 PM

## 2018-07-20 NOTE — Patient Instructions (Signed)
Labs were done today. We will call you with any ABNORMAL results. No news is good news!  STOP taking Digoxin.  Please keep follow up appointment.  Your physician wants you to follow-up in: 6 MONTHS You will receive a reminder letter in the mail two months in advance. If you don't receive a letter, please call our office to schedule the follow-up appointment.

## 2018-07-21 ENCOUNTER — Ambulatory Visit (INDEPENDENT_AMBULATORY_CARE_PROVIDER_SITE_OTHER): Payer: Medicare Other | Admitting: Family Medicine

## 2018-07-21 ENCOUNTER — Encounter: Payer: Self-pay | Admitting: Family Medicine

## 2018-07-21 VITALS — BP 120/70 | HR 90 | Temp 98.6°F | Ht 65.0 in | Wt 201.0 lb

## 2018-07-21 DIAGNOSIS — J029 Acute pharyngitis, unspecified: Secondary | ICD-10-CM | POA: Insufficient documentation

## 2018-07-21 DIAGNOSIS — H6981 Other specified disorders of Eustachian tube, right ear: Secondary | ICD-10-CM

## 2018-07-21 DIAGNOSIS — B349 Viral infection, unspecified: Secondary | ICD-10-CM | POA: Insufficient documentation

## 2018-07-21 LAB — POCT RAPID STREP A (OFFICE): Rapid Strep A Screen: NEGATIVE
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
## 2018-07-21 NOTE — Patient Instructions (Signed)
Viral Respiratory Infection A respiratory infection is an illness that affects part of the respiratory system, such as the lungs, nose, or throat. A respiratory infection that is caused by a virus is called a viral respiratory infection. Common types of viral respiratory infections include:  A cold.  The flu (influenza).  A respiratory syncytial virus (RSV) infection. What are the causes? This condition is caused by a virus. What are the signs or symptoms? Symptoms of this condition include:  A stuffy or runny nose.  Yellow or green nasal discharge.  A cough.  Sneezing.  Fatigue.  Achy muscles.  A sore throat.  Sweating or chills.  A fever.  A headache. How is this diagnosed? This condition may be diagnosed based on:  Your symptoms.  A physical exam.  Testing of nasal swabs. How is this treated? This condition may be treated with medicines, such as:  Antiviral medicine. This may shorten the length of time a person has symptoms.  Expectorants. These make it easier to cough up mucus.  Decongestant nasal sprays.  Acetaminophen or NSAIDs to relieve fever and pain. Antibiotic medicines are not prescribed for viral infections. This is because antibiotics are designed to kill bacteria. They are not effective against viruses. Follow these instructions at home:  Managing pain and congestion  Take over-the-counter and prescription medicines only as told by your health care provider.  If you have a sore throat, gargle with a salt-water mixture 3-4 times a day or as needed. To make a salt-water mixture, completely dissolve -1 tsp of salt in 1 cup of warm water.  Use nose drops made from salt water to ease congestion and soften raw skin around your nose.  Drink enough fluid to keep your urine pale yellow. This helps prevent dehydration and helps loosen up mucus. General instructions  Rest as much as possible.  Do not drink alcohol.  Do not use any products  that contain nicotine or tobacco, such as cigarettes and e-cigarettes. If you need help quitting, ask your health care provider.  Keep all follow-up visits as told by your health care provider. This is important. How is this prevented?   Get an annual flu shot. You may get the flu shot in late summer, fall, or winter. Ask your health care provider when you should get your flu shot.  Avoid exposing others to your respiratory infection. ? Stay home from work or school as told by your health care provider. ? Wash your hands with soap and water often, especially after you cough or sneeze. If soap and water are not available, use alcohol-based hand sanitizer.  Avoid contact with people who are sick during cold and flu season. This is generally fall and winter. Contact a health care provider if:  Your symptoms last for 10 days or longer.  Your symptoms get worse over time.  You have a fever.  You have severe sinus pain in your face or forehead.  The glands in your jaw or neck become very swollen. Get help right away if you:  Feel pain or pressure in your chest.  Have shortness of breath.  Faint or feel like you will faint.  Have severe and persistent vomiting.  Feel confused or disoriented. Summary  A respiratory infection is an illness that affects part of the respiratory system, such as the lungs, nose, or throat. A respiratory infection that is caused by a virus is called a viral respiratory infection.  Common types of viral respiratory infections are a   cold, influenza, and respiratory syncytial virus (RSV) infection.  Symptoms of this condition include a stuffy or runny nose, cough, sneezing, fatigue, achy muscles, sore throat, and fevers or chills.  Antibiotic medicines are not prescribed for viral infections. This is because antibiotics are designed to kill bacteria. They are not effective against viruses. This information is not intended to replace advice given to you by  your health care provider. Make sure you discuss any questions you have with your health care provider. Document Released: 02/18/2005 Document Revised: 06/21/2017 Document Reviewed: 06/21/2017 Elsevier Interactive Patient Education  2019 [employer].  Eustachian Tube Dysfunction  Eustachian tube dysfunction refers to a condition in which a blockage develops in the narrow passage that connects the middle ear to the back of the nose (eustachian tube). The eustachian tube regulates air pressure in the middle ear by letting air move between the ear and nose. It also helps to drain fluid from the middle ear space. Eustachian tube dysfunction can affect one or both ears. When the eustachian tube does not function properly, air pressure, fluid, or both can build up in the middle ear. What are the causes? This condition occurs when the eustachian tube becomes blocked or cannot open normally. Common causes of this condition include:  Ear infections.  Colds and other infections that affect the nose, mouth, and throat (upper respiratory tract).  Allergies.  Irritation from cigarette smoke.  Irritation from stomach acid coming up into the esophagus (gastroesophageal reflux). The esophagus is the tube that carries food from the mouth to the stomach.  Sudden changes in air pressure, such as from descending in an airplane or scuba diving.  Abnormal growths in the nose or throat, such as: ? Growths that line the nose (nasal polyps). ? Abnormal growth of cells (tumors). ? Enlarged tissue at the back of the throat (adenoids). What increases the risk? You are more likely to develop this condition if:  You smoke.  You are overweight.  You are a child who has: ? Certain birth defects of the mouth, such as cleft palate. ? Large tonsils or adenoids. What are the signs or symptoms? Common symptoms of this condition include:  A feeling of fullness in the ear.  Ear pain.  Clicking or popping  noises in the ear.  Ringing in the ear.  Hearing loss.  Loss of balance.  Dizziness. Symptoms may get worse when the air pressure around you changes, such as when you travel to an area of high elevation, fly on an airplane, or go scuba diving. How is this diagnosed? This condition may be diagnosed based on:  Your symptoms.  A physical exam of your ears, nose, and throat.  Tests, such as those that measure: ? The movement of your eardrum (tympanogram). ? Your hearing (audiometry). How is this treated? Treatment depends on the cause and severity of your condition.  In mild cases, you may relieve your symptoms by moving air into your ears. This is called "popping the ears."  In more severe cases, or if you have symptoms of fluid in your ears, treatment may include: ? Medicines to relieve congestion (decongestants). ? Medicines that treat allergies (antihistamines). ? Nasal sprays or ear drops that contain medicines that reduce swelling (steroids). ? A procedure to drain the fluid in your eardrum (myringotomy). In this procedure, a small tube is placed in the eardrum to:  Drain the fluid.  Restore the air in the middle ear space. ? A procedure to insert a balloon  device through the nose to inflate the opening of the eustachian tube (balloon dilation). Follow these instructions at home: Lifestyle  Do not do any of the following until your health care provider approves: ? Travel to high altitudes. ? Fly in airplanes. ? Work in a Pension scheme manager or room. ? Scuba dive.  Do not use any products that contain nicotine or tobacco, such as cigarettes and e-cigarettes. If you need help quitting, ask your health care provider.  Keep your ears dry. Wear fitted earplugs during showering and bathing. Dry your ears completely after. General instructions  Take over-the-counter and prescription medicines only as told by your health care provider.  Use techniques to help pop your ears  as recommended by your health care provider. These may include: ? Chewing gum. ? Yawning. ? Frequent, forceful swallowing. ? Closing your mouth, holding your nose closed, and gently blowing as if you are trying to blow air out of your nose.  Keep all follow-up visits as told by your health care provider. This is important. Contact a health care provider if:  Your symptoms do not go away after treatment.  Your symptoms come back after treatment.  You are unable to pop your ears.  You have: ? A fever. ? Pain in your ear. ? Pain in your head or neck. ? Fluid draining from your ear.  Your hearing suddenly changes.  You become very dizzy.  You lose your balance. Summary  Eustachian tube dysfunction refers to a condition in which a blockage develops in the eustachian tube.  It can be caused by ear infections, allergies, inhaled irritants, or abnormal growths in the nose or throat.  Symptoms include ear pain, hearing loss, or ringing in the ears.  Mild cases are treated with maneuvers to unblock the ears, such as yawning or ear popping.  Severe cases are treated with medicines. Surgery may also be done (rare). This information is not intended to replace advice given to you by your health care provider. Make sure you discuss any questions you have with your health care provider. Document Released: 06/07/2015 Document Revised: 08/31/2017 Document Reviewed: 08/31/2017 Elsevier Interactive Patient Education  2019 [employer].

## 2018-07-25 ENCOUNTER — Other Ambulatory Visit (HOSPITAL_COMMUNITY): Payer: Self-pay

## 2018-07-25 ENCOUNTER — Other Ambulatory Visit: Payer: Self-pay

## 2018-07-25 ENCOUNTER — Ambulatory Visit: Payer: Medicare Other | Admitting: Family Medicine

## 2018-07-25 ENCOUNTER — Other Ambulatory Visit: Payer: Self-pay | Admitting: Family Medicine

## 2018-07-25 ENCOUNTER — Other Ambulatory Visit: Payer: Self-pay | Admitting: Internal Medicine

## 2018-07-25 DIAGNOSIS — J00 Acute nasopharyngitis [common cold]: Secondary | ICD-10-CM

## 2018-07-25 DIAGNOSIS — J209 Acute bronchitis, unspecified: Secondary | ICD-10-CM

## 2018-07-25 DIAGNOSIS — J029 Acute pharyngitis, unspecified: Secondary | ICD-10-CM

## 2018-07-25 MED ORDER — ALBUTEROL SULFATE HFA 108 (90 BASE) MCG/ACT IN AERS: 1.0000 | INHALATION_SPRAY | Freq: Four times a day (QID) | RESPIRATORY_TRACT | 0 refills | Status: DC | PRN

## 2018-07-25 MED ORDER — TORSEMIDE 20 MG PO TABS: 40.0000 mg | ORAL_TABLET | Freq: Every day | ORAL | 3 refills | Status: DC

## 2018-07-25 MED ORDER — FLUTICASONE PROPIONATE 50 MCG/ACT NA SUSP: 2.0000 | Freq: Every day | NASAL | 0 refills | Status: DC | PRN

## 2018-07-26 ENCOUNTER — Ambulatory Visit (INDEPENDENT_AMBULATORY_CARE_PROVIDER_SITE_OTHER): Payer: Medicare Other

## 2018-07-26 DIAGNOSIS — I5042 Chronic combined systolic (congestive) and diastolic (congestive) heart failure: Secondary | ICD-10-CM

## 2018-07-26 DIAGNOSIS — Z95810 Presence of automatic (implantable) cardiac defibrillator: Secondary | ICD-10-CM

## 2018-07-26 NOTE — Progress Notes (Signed)
EPIC Encounter for ICM Monitoring  Patient Name: [name] is a 43 y.o. female Date: 07/26/2018 Primary Care Physican: Hoyt Koch, MD Primary Cardiologist: Smith/Bensimhon Electrophysiologist: Caryl Comes LastWeight:195.6lbs Today's Weight:unknown  Attempted call to patient and unable to reach.  Left detailed message per DPR regarding transmission. Transmission reviewed.  Dr Haroldine Laws had pt take extra Torsemide x 2 days at 07/20/2018 office since Heartlogic HF index was 12.  Notes say she can repeat extra Torsemide as needed and discussed the use of sliding scale.   HeartLogic Heart Failure Index is16 suggesting fluid accumulation.  Prescribed:Torsemide20 mgTake 2 tablets (40 mg total) by mouth daily. Take extra 20 mg as needed.   Labs: 07/04/2018 Creatinine 0.98, BUN 10, Potassium 3.4, Sodium 141, GFR >60 06/06/2018 Creatinine 0.93, BUN 7,   Potassium 3.7, Sodium 138, GFR >60  05/27/2018 Creatinine 1.02, BUN 8,   Potassium 3.5, Sodium 137, GFR >60  05/19/2018 Creatinine 0.76, BUN >5, Potassium 3.7, Sodium 139, GFR >60 01/14/2018 Creatinine 0.90, BUN 12, Potassium 3.7, Sodium 139, GFR 87.56 11/24/2017 Creatinine1.02, BUN12, Potassium3.9, Sodium137, GFR>60 11/23/2017 Creatinine1.08, BUN11, Potassium3.5, Sodium138, GFR>60  11/22/2017 Creatinine0.84, BUN8, Potassium 3.3, Sodium140, GFR>60  A complete set of results can be found in Results Review  Recommendations:Left voice mail with ICM number and requested a call back.Will advise patient to take extra Torsemide as instructed by Dr Haroldine Laws at last office visit if patient reached.   Follow-up plan: ICM clinic phone appointment on3/01/2019.  Copy of ICM check sent to Dr. Caryl Comes and Dr Haroldine Laws.     [place], RN 07/26/2018 9:56 AM

## 2018-07-27 ENCOUNTER — Encounter: Payer: Self-pay | Admitting: Family Medicine

## 2018-07-27 ENCOUNTER — Ambulatory Visit (INDEPENDENT_AMBULATORY_CARE_PROVIDER_SITE_OTHER): Payer: HMO | Admitting: Internal Medicine

## 2018-07-27 ENCOUNTER — Encounter: Payer: Self-pay | Admitting: Internal Medicine

## 2018-07-27 VITALS — BP 124/82 | HR 97 | Temp 98.1°F | Ht 65.0 in | Wt 200.0 lb

## 2018-07-27 DIAGNOSIS — I5042 Chronic combined systolic (congestive) and diastolic (congestive) heart failure: Secondary | ICD-10-CM

## 2018-07-27 DIAGNOSIS — R05 Cough: Secondary | ICD-10-CM

## 2018-07-27 DIAGNOSIS — M7918 Myalgia, other site: Secondary | ICD-10-CM | POA: Diagnosis not present

## 2018-07-27 DIAGNOSIS — R059 Cough, unspecified: Secondary | ICD-10-CM | POA: Insufficient documentation

## 2018-07-27 MED ORDER — METHYLPREDNISOLONE ACETATE 40 MG/ML IJ SUSP
40.0000 mg | Freq: Once | INTRAMUSCULAR | Status: AC
  Administered 2018-07-27: 40 mg via INTRAMUSCULAR

## 2018-07-27 MED ORDER — PROMETHAZINE-DM 6.25-15 MG/5ML PO SYRP: 5.0000 mL | ORAL_SOLUTION | Freq: Four times a day (QID) | ORAL | 0 refills | Status: DC | PRN

## 2018-07-27 MED ORDER — NITROGLYCERIN 0.4 MG SL SUBL: 0.4000 mg | SUBLINGUAL_TABLET | SUBLINGUAL | 0 refills | Status: DC | PRN
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
## 2018-07-27 NOTE — Assessment & Plan Note (Signed)
Given depo-medrol 40 mg IM at visit for mild wheezing in lungs. Likely viral in etiology. No indication for antibiotics today. Given her CHF she has limited options for otc treatment. Rx for promethazine cough medicine and advised zyrtec over the counter without D component.

## 2018-07-27 NOTE — Patient Instructions (Signed)
I would recommend to do zyrtec (cetirizine).   We have given you the steroid shot today.   We have sent in cough medicine to help with the coughing.
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
## 2018-07-27 NOTE — Assessment & Plan Note (Signed)
Stable today, she cannot take any cold medications due to blood pressure and heart failure and informed about this.

## 2018-07-28 ENCOUNTER — Other Ambulatory Visit: Payer: Self-pay | Admitting: Cardiology

## 2018-08-01 ENCOUNTER — Telehealth: Payer: Self-pay

## 2018-08-01 ENCOUNTER — Ambulatory Visit (INDEPENDENT_AMBULATORY_CARE_PROVIDER_SITE_OTHER): Payer: HMO

## 2018-08-01 DIAGNOSIS — I5042 Chronic combined systolic (congestive) and diastolic (congestive) heart failure: Secondary | ICD-10-CM

## 2018-08-01 DIAGNOSIS — Z95810 Presence of automatic (implantable) cardiac defibrillator: Secondary | ICD-10-CM

## 2018-08-01 MED ORDER — VITAMIN D (ERGOCALCIFEROL) 1.25 MG (50000 UNIT) PO CAPS: 50000.0000 [IU] | ORAL_CAPSULE | ORAL | 0 refills | Status: DC

## 2018-08-01 NOTE — Telephone Encounter (Signed)
Left message for patient to remind of missed remote transmission.  

## 2018-08-01 NOTE — Telephone Encounter (Signed)
Refill done.  

## 2018-08-03 NOTE — Progress Notes (Signed)
EPIC Encounter for ICM Monitoring  Patient Name: [name] is a 43 y.o. female Date: 08/03/2018 Primary Care Physican: Hoyt Koch, MD Primary Cardiologist: Smith/Bensimhon Electrophysiologist: Caryl Comes LastWeight:195.6lbs Today's Weight:unknown  Spoke with patient and she has had a bad cold with chest congestion.  She is starting to feel better. Dr Haroldine Laws had pt take extra Torsemide x 2 days at 07/20/2018 office since Heartlogic HF index was 12.  Notes say she can repeat extra Torsemide as needed and discussed the use of sliding scale.   HeartLogic Heart Failure Index is18suggesting fluid accumulation.  Index was 20 on 07/30/2018  Prescribed:Torsemide20 mgTake 2 tablets (40 mg total) by mouth daily. Take extra 20 mg as needed.  Labs: 07/04/2018 Creatinine0.98, BUN10, Potassium3.4, Sodium141, GFR>60 06/06/2018 Creatinine0.93, BUN7, Potassium 3.7, Sodium138, GFR>60  05/27/2018 Creatinine1.02, BUN8, Potassium 3.5, Sodium137, GFR>60 05/19/2018 Creatinine 0.76, BUN >5, Potassium 3.7, Sodium 139, GFR >60 01/14/2018 Creatinine 0.90, BUN 12, Potassium 3.7, Sodium 139, GFR 87.56 11/24/2017 Creatinine1.02, BUN12, Potassium3.9, Sodium137, GFR>60 11/23/2017 Creatinine1.08, BUN11, Potassium3.5, Sodium138, GFR>60  11/22/2017 Creatinine0.84, BUN8, Potassium 3.3, Sodium140, GFR>60  A complete set of results can be found in Results Review  Recommendations:Will advise patient to take extra Torsemide as instructed by Dr Haroldine Laws at last office visit if patient reached.   Follow-up plan: ICM clinic phone appointment on3/23/2020.  Copy of ICM check sent to Dr. Caryl Comes and Dr Haroldine Laws.    [place], RN 08/03/2018 7:40 AM
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
## 2018-08-05 ENCOUNTER — Other Ambulatory Visit: Payer: Self-pay

## 2018-08-05 ENCOUNTER — Ambulatory Visit (INDEPENDENT_AMBULATORY_CARE_PROVIDER_SITE_OTHER)
Admission: RE | Admit: 2018-08-05 | Discharge: 2018-08-05 | Disposition: A | Discharge status: Home / Self Care | Payer: HMO | Source: Ambulatory Visit | Attending: Family Medicine | Admitting: Family Medicine

## 2018-08-05 ENCOUNTER — Ambulatory Visit (INDEPENDENT_AMBULATORY_CARE_PROVIDER_SITE_OTHER): Payer: HMO | Admitting: Family Medicine

## 2018-08-05 VITALS — BP 116/74 | HR 46 | Ht 65.0 in | Wt 198.0 lb

## 2018-08-05 DIAGNOSIS — S134XXA Sprain of ligaments of cervical spine, initial encounter: Secondary | ICD-10-CM

## 2018-08-05 DIAGNOSIS — M542 Cervicalgia: Secondary | ICD-10-CM | POA: Diagnosis not present

## 2018-08-05 DIAGNOSIS — M999 Biomechanical lesion, unspecified: Secondary | ICD-10-CM | POA: Diagnosis not present

## 2018-08-05 DIAGNOSIS — S199XXA Unspecified injury of neck, initial encounter: Secondary | ICD-10-CM | POA: Diagnosis not present

## 2018-08-05 NOTE — Patient Instructions (Signed)
Good to see you  I did do manipulation and should do well  Ice is your friend New posture exercises  Xray of neck downstairs See me again in 4ish weeks

## 2018-08-06 ENCOUNTER — Encounter: Payer: Self-pay | Admitting: Family Medicine
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
## 2018-08-06 NOTE — Assessment & Plan Note (Signed)
Decision today to treat with OMT was based on Physical Exam  After verbal consent patient was treated with HVLA, ME, FPR techniques in cervical, thoracic, rib lumbar and sacral areas  Patient tolerated the procedure well with improvement in symptoms  Patient given exercises, stretches and lifestyle modifications  See medications in patient instructions if given  Patient will follow up in 4 weeks 

## 2018-08-08 ENCOUNTER — Other Ambulatory Visit (HOSPITAL_COMMUNITY): Payer: Self-pay | Admitting: Internal Medicine

## 2018-08-10 ENCOUNTER — Ambulatory Visit (INDEPENDENT_AMBULATORY_CARE_PROVIDER_SITE_OTHER): Payer: HMO

## 2018-08-10 ENCOUNTER — Other Ambulatory Visit: Payer: Self-pay

## 2018-08-10 DIAGNOSIS — I5042 Chronic combined systolic (congestive) and diastolic (congestive) heart failure: Secondary | ICD-10-CM

## 2018-08-10 DIAGNOSIS — Z95810 Presence of automatic (implantable) cardiac defibrillator: Secondary | ICD-10-CM

## 2018-08-10 NOTE — Progress Notes (Signed)
EPIC Encounter for ICM Monitoring  Patient Name: [name] is a 43 y.o. female Date: 08/10/2018 Primary Care Physican: Hoyt Koch, MD Primary Cardiologist: Smith/Bensimhon Electrophysiologist: Caryl Comes LastWeight:195.6lbs Today's Weight:192 lbs  Spoke with patient reports she still has a cold which she thinks is causing her some fluid retention. She said she can feel the chest pressure at nighty when she is lying down and has a cough.  She is taking extra Torsemide as needed.  Patient wants to know if she can take 2 of the Entresto 49-51 dosage twice a day instead of the 1 tablet of 97-130 tablet.  She has so many of the lower dosage left due to she was switched to the higher dosage and she does not want them to go to waste.  Advised I would ask [facility] pharmacist the question and call her back when I have an answer.  Message sent to [facility], Pharmacist  HeartLogic Heart Failure Index is10suggestingfluid accumulation.  Index has decreased from 18 on 08/02/2018  Prescribed:Torsemide20 mgTake 2 tablets (40 mg total) by mouth daily. Can take extra Torsemide as needed and Dr Haroldine Laws has discussed sliding scale.  Labs: 07/04/2018 Creatinine0.98, BUN10, Potassium3.4, Sodium141, GFR>60 06/06/2018 Creatinine0.93, BUN7, Potassium 3.7, Sodium138, GFR>60  05/27/2018 Creatinine1.02, BUN8, Potassium 3.5, Sodium137, GFR>60 05/19/2018 Creatinine 0.76, BUN >5, Potassium 3.7, Sodium 139, GFR >60 01/14/2018 Creatinine 0.90, BUN 12, Potassium 3.7, Sodium 139, GFR 87.56 11/24/2017 Creatinine1.02, BUN12, Potassium3.9, Sodium137, GFR>60 11/23/2017 Creatinine1.08, BUN11, Potassium3.5, Sodium138, GFR>60  11/22/2017 Creatinine0.84, BUN8, Potassium 3.3, Sodium140, GFR>60  A complete set of results can be found in Results Review  Recommendations:Patient is self adjusting Torsemide as  needed (as instructed at Dr Bensimhon's office).   Follow-up plan: ICM clinic phone appointment on3/23/2020.  Copy of ICM check sent to Dr. Caryl Comes and Dr Haroldine Laws.    [place], RN 08/10/2018 8:10 AM

## 2018-08-12 NOTE — Progress Notes (Signed)
Received following information from pharmacist.   Received: 2 days ago  [place], Harlon Flor, [place], Artemus Romanoff Panda, RN        Hi Margarita Grizzle,   Yes she can double up and take 2 of her lower dose tablets twice a day (but patients who have their dose decreased cannot cut Entresto tablets in half).   Thanks,  [employer]

## 2018-08-12 NOTE — Progress Notes (Signed)
Call to patient and advised of the pharmacist response regarding Entresto (see below).  She said she understood and will make sure she does well on higher dosage before using the lower Entresto dosage.

## 2018-08-15 ENCOUNTER — Other Ambulatory Visit: Payer: Self-pay

## 2018-08-15 ENCOUNTER — Ambulatory Visit (INDEPENDENT_AMBULATORY_CARE_PROVIDER_SITE_OTHER): Payer: HMO

## 2018-08-15 DIAGNOSIS — I5042 Chronic combined systolic (congestive) and diastolic (congestive) heart failure: Secondary | ICD-10-CM

## 2018-08-15 DIAGNOSIS — Z95810 Presence of automatic (implantable) cardiac defibrillator: Secondary | ICD-10-CM | POA: Diagnosis not present

## 2018-08-16 ENCOUNTER — Telehealth: Payer: Self-pay

## 2018-08-16 NOTE — Telephone Encounter (Signed)
Remote ICM transmission received.  Attempted call to patient regarding ICM remote transmission and left detailed message, per DPR, with next ICM remote transmission date of 09/19/2018.  Advised to return call for any fluid symptoms or questions.    

## 2018-08-16 NOTE — Progress Notes (Signed)
EPIC Encounter for ICM Monitoring  Patient Name: [name] is a 43 y.o. female Date: 08/16/2018 Primary Care Physican: Hoyt Koch, MD Primary Cardiologist: Smith/Bensimhon Electrophysiologist: Caryl Comes LastWeight:195.6lbs 08/10/2018 Weight:192 lbs 08/16/2018 Weight:  Unknown   Attempted call to patient and unable to reach.  Left detailed message per DPR regarding transmission. Transmission reviewed.   HeartLogic Heart Failure Index has dropped from 10 to 8 suggesting a decrease in fluid accumulation.  Prescribed:Torsemide20 mgTake 2 tablets (40 mg total) by mouth daily. Can take extra Torsemide as needed and Dr Haroldine Laws has discussed sliding scale.  Labs: 07/04/2018 Creatinine0.98, BUN10, Potassium3.4, Sodium141, GFR>60 06/06/2018 Creatinine0.93, BUN7, Potassium 3.7, Sodium138, GFR>60  05/27/2018 Creatinine1.02, BUN8, Potassium 3.5, Sodium137, GFR>60 05/19/2018 Creatinine 0.76, BUN >5, Potassium 3.7, Sodium 139, GFR >60 01/14/2018 Creatinine 0.90, BUN 12, Potassium 3.7, Sodium 139, GFR 87.56 11/24/2017 Creatinine1.02, BUN12, Potassium3.9, Sodium137, GFR>60 11/23/2017 Creatinine1.08, BUN11, Potassium3.5, Sodium138, GFR>60  11/22/2017 Creatinine0.84, BUN8, Potassium 3.3, Sodium140, GFR>60  A complete set of results can be found in Results Review  Recommendations:Left voice mail with ICM number and encouraged to call if experiencing any fluid symptoms.  Follow-up plan: ICM clinic phone appointment on4/27/2020.        Copy of ICM check sent to Dr. Caryl Comes     [place], RN 08/16/2018 2:23 PM

## 2018-08-23 ENCOUNTER — Telehealth (HOSPITAL_COMMUNITY): Payer: Self-pay | Admitting: *Deleted

## 2018-08-23 NOTE — Telephone Encounter (Signed)
Received letter from [facility], entresto is not on their list of covered drugs and they have supplied pt with a 30 day supply on 08/09/18 and will need to change med to a covered med.  Completed PA on CMM on 08/23/2018

## 2018-08-24 DIAGNOSIS — M25572 Pain in left ankle and joints of left foot: Secondary | ICD-10-CM | POA: Diagnosis not present

## 2018-08-24 DIAGNOSIS — G4733 Obstructive sleep apnea (adult) (pediatric): Secondary | ICD-10-CM | POA: Diagnosis not present

## 2018-08-24 DIAGNOSIS — J449 Chronic obstructive pulmonary disease, unspecified: Secondary | ICD-10-CM | POA: Diagnosis not present

## 2018-08-24 DIAGNOSIS — L821 Other seborrheic keratosis: Secondary | ICD-10-CM | POA: Diagnosis not present

## 2018-08-24 DIAGNOSIS — M25579 Pain in unspecified ankle and joints of unspecified foot: Secondary | ICD-10-CM | POA: Diagnosis not present

## 2018-08-24 DIAGNOSIS — J441 Chronic obstructive pulmonary disease with (acute) exacerbation: Secondary | ICD-10-CM | POA: Diagnosis not present

## 2018-08-24 DIAGNOSIS — L570 Actinic keratosis: Secondary | ICD-10-CM | POA: Diagnosis not present

## 2018-08-24 DIAGNOSIS — S82899A Other fracture of unspecified lower leg, initial encounter for closed fracture: Secondary | ICD-10-CM | POA: Diagnosis not present

## 2018-08-24 DIAGNOSIS — Z85820 Personal history of malignant melanoma of skin: Secondary | ICD-10-CM | POA: Diagnosis not present

## 2018-08-24 NOTE — Telephone Encounter (Signed)
Entresto approved through 08/24/2019

## 2018-08-26 ENCOUNTER — Telehealth: Payer: Self-pay

## 2018-08-26 ENCOUNTER — Ambulatory Visit (INDEPENDENT_AMBULATORY_CARE_PROVIDER_SITE_OTHER): Payer: HMO

## 2018-08-26 ENCOUNTER — Other Ambulatory Visit: Payer: Self-pay

## 2018-08-26 DIAGNOSIS — Z95810 Presence of automatic (implantable) cardiac defibrillator: Secondary | ICD-10-CM

## 2018-08-26 DIAGNOSIS — I5042 Chronic combined systolic (congestive) and diastolic (congestive) heart failure: Secondary | ICD-10-CM

## 2018-08-26 NOTE — Progress Notes (Signed)
EPIC Encounter for ICM Monitoring  Patient Name: [name] is a 43 y.o. female Date: 08/26/2018 Primary Care Physican: Hoyt Koch, MD Primary Cardiologist: Smith/Bensimhon Electrophysiologist: Caryl Comes LastWeight:195.6lbs 08/10/2018 Weight:192 lbs 08/26/2018 Weight:  Unknown   Attempted call to patient and unable to reach.  Left detailed message per DPR regarding transmission. Transmission reviewed.   08/26/2018 Alert for HeartLogic Heart Failure Index has increased to 21 suggesting fluid accumulation.  Prescribed:Torsemide20 mgTake 2 tablets (40 mg total) by mouth daily. Can take extra Torsemide as needed and Dr Haroldine Laws has discussed sliding scale.  Labs: 07/04/2018 Creatinine0.98, BUN10, Potassium3.4, Sodium141, GFR>60 06/06/2018 Creatinine0.93, BUN7, Potassium 3.7, Sodium138, GFR>60  05/27/2018 Creatinine1.02, BUN8, Potassium 3.5, Sodium137, GFR>60 05/19/2018 Creatinine 0.76, BUN >5, Potassium 3.7, Sodium 139, GFR >60 01/14/2018 Creatinine 0.90, BUN 12, Potassium 3.7, Sodium 139, GFR 87.56 11/24/2017 Creatinine1.02, BUN12, Potassium3.9, Sodium137, GFR>60 11/23/2017 Creatinine1.08, BUN11, Potassium3.5, Sodium138, GFR>60  11/22/2017 Creatinine0.84, BUN8, Potassium 3.3, Sodium140, GFR>60  A complete set of results can be found in Results Review  Recommendations:Left voice mail with ICM number and encouraged to call if experiencing any fluid symptoms.  Follow-up plan: ICM clinic phone appointment on4/12/2018 to recheck fluid levels.        Copy of ICM check sent to Dr. Caryl Comes and Dr Haroldine Laws.     [place], RN 08/26/2018 8:24 AM

## 2018-08-26 NOTE — Telephone Encounter (Signed)
Attempted call to patient and left voice mail message to return call regarding device report alert.

## 2018-08-31 ENCOUNTER — Other Ambulatory Visit: Payer: Self-pay

## 2018-08-31 ENCOUNTER — Ambulatory Visit (INDEPENDENT_AMBULATORY_CARE_PROVIDER_SITE_OTHER): Payer: HMO

## 2018-08-31 DIAGNOSIS — I5042 Chronic combined systolic (congestive) and diastolic (congestive) heart failure: Secondary | ICD-10-CM

## 2018-08-31 DIAGNOSIS — Z95810 Presence of automatic (implantable) cardiac defibrillator: Secondary | ICD-10-CM

## 2018-09-02 ENCOUNTER — Telehealth: Payer: Self-pay

## 2018-09-02 NOTE — Progress Notes (Signed)
Pt returned call.  She has been sitting a lot because she is sewing mask due to COVID 19 and also because she is wearing a boot on left leg because of broken ankle from fall in the last few days.  Her right leg is very swollen, and weight gain of 3 - 5 lbs above her baseline of 192-193 lbs. She doubled Torsemide 4/9 and today and will again tomorrow. Advised to call back if condition worsens or symptoms do not improve.  Fluid level recheck 4/20 and will monitor for alerts.

## 2018-09-02 NOTE — Telephone Encounter (Signed)
Remote ICM transmission received.  Attempted call to patient regarding ICM remote transmission and left detailed message, per DPR, to return call regarding transmission.

## 2018-09-02 NOTE — Progress Notes (Signed)
EPIC Encounter for ICM Monitoring  Patient Name: [name] is a 43 y.o. female Date: 09/02/2018 Primary Care Physican: Hoyt Koch, MD Primary Cardiologist: Smith/Bensimhon Electrophysiologist: Caryl Comes LastWeight:195.6lbs 3/18/2020Weight:192 lbs 09/02/2018 Weight:Unknown   Attempted call to patient and unable to reach. Left detailed message per DPR regarding transmission. Transmission reviewed.   09/02/2018 Alert for HeartLogic Heart Failure Indexhas increased to 32 on 09/02/2018 from 21 on 08/27/2018 suggesting fluid accumulation.  Prescribed:Torsemide20 mgTake 2 tablets (40 mg total) by mouth daily. Can take extra Torsemide as needed and Dr Haroldine Laws has discussed sliding scale.  Labs: 07/04/2018 Creatinine0.98, BUN10, Potassium3.4, Sodium141, GFR>60 06/06/2018 Creatinine0.93, BUN7, Potassium 3.7, Sodium138, GFR>60  05/27/2018 Creatinine1.02, BUN8, Potassium 3.5, Sodium137, GFR>60 05/19/2018 Creatinine 0.76, BUN >5, Potassium 3.7, Sodium 139, GFR >60 01/14/2018 Creatinine 0.90, BUN 12, Potassium 3.7, Sodium 139, GFR 87.56 11/24/2017 Creatinine1.02, BUN12, Potassium3.9, Sodium137, GFR>60 11/23/2017 Creatinine1.08, BUN11, Potassium3.5, Sodium138, GFR>60  11/22/2017 Creatinine0.84, BUN8, Potassium 3.3, Sodium140, GFR>60  A complete set of results can be found in Results Review  Recommendations:Left voice mail with ICM number and encouraged to call if experiencing any fluid symptoms.  Follow-up plan: ICM clinic phone appointment on4/20/2020 to recheck fluid levels and monitor for alerts.  Copy of ICM check sent to Dr. Caryl Comes and Dr Haroldine Laws.       [place], RN 09/02/2018 7:57 AM

## 2018-09-06 ENCOUNTER — Ambulatory Visit: Payer: HMO | Admitting: Family Medicine

## 2018-09-12 ENCOUNTER — Other Ambulatory Visit: Payer: Self-pay

## 2018-09-12 ENCOUNTER — Ambulatory Visit (INDEPENDENT_AMBULATORY_CARE_PROVIDER_SITE_OTHER): Payer: HMO

## 2018-09-12 ENCOUNTER — Encounter: Payer: Self-pay | Admitting: Internal Medicine

## 2018-09-12 ENCOUNTER — Ambulatory Visit (INDEPENDENT_AMBULATORY_CARE_PROVIDER_SITE_OTHER): Payer: HMO | Admitting: Internal Medicine

## 2018-09-12 DIAGNOSIS — Z95810 Presence of automatic (implantable) cardiac defibrillator: Secondary | ICD-10-CM

## 2018-09-12 DIAGNOSIS — R220 Localized swelling, mass and lump, head: Secondary | ICD-10-CM

## 2018-09-12 DIAGNOSIS — I5042 Chronic combined systolic (congestive) and diastolic (congestive) heart failure: Secondary | ICD-10-CM

## 2018-09-12 MED ORDER — PREDNISONE 20 MG PO TABS: 40.0000 mg | ORAL_TABLET | Freq: Every day | ORAL | 0 refills | Status: DC

## 2018-09-12 NOTE — Progress Notes (Signed)
Virtual Visit via Video Note  I connected with [name] on 09/12/18 at  1:20 PM EDT by a video enabled telemedicine application and verified that I am speaking with the correct person using two identifiers.   I discussed the limitations of evaluation and management by telemedicine and the availability of in person appointments. The patient expressed understanding and agreed to proceed.  History of Present Illness: The patient is a 43 y.o. female with visit for lip swelling. Started yesterday. Has lip swelling, burning pain and pain. She denies itching or rash. She denies new foods or medications. Denies new makeup products. She denies using any lip products prior to onset. She does take entresto and dosage was adjusted in the last 2-3 months. Denies throat closing or SOB, denies tongue swelling. Overall it is not improving although she did try benadryl last night. This did not help and just made her sleep. She denies worsening today but not better. Does have some food allergies but no new foods recently and has not ingested any foods she is allergic to.   Observations/Objective: Appearance: not ill appearing, breathing appears normal, lips swollen and some cracks in the skin, no tongue swelling and oropharynx not swollen, casual grooming, abdomen does not appear distended, mental status is A and O times 3  Assessment and Plan: See problem oriented charting  Follow Up Instructions: rx for prednisone and will consult with her cardiologist but may represent angioedema and may need to stop entresto.  I discussed the assessment and treatment plan with the patient. The patient was provided an opportunity to ask questions and all were answered. The patient agreed with the plan and demonstrated an understanding of the instructions.   The patient was advised to call back or seek an in-person evaluation if the symptoms worsen or if the condition fails to improve as anticipated.  Hoyt Koch,  MD

## 2018-09-12 NOTE — Assessment & Plan Note (Signed)
Concern for angioedema with the lip swelling with increased dose entresto. Will communicate with her cardiologist and have asked to her hold entresto until she hears from [place].

## 2018-09-12 NOTE — Assessment & Plan Note (Addendum)
Concern that this represents angioedema and she has been asked to hold entresto until we can communicate with her cardiologist to ask if they want to change this medication. Rx for prednisone and can use vaseline on the lips.

## 2018-09-14 ENCOUNTER — Telehealth: Payer: Self-pay

## 2018-09-14 NOTE — Progress Notes (Signed)
EPIC Encounter for ICM Monitoring  Patient Name: [name] is a 43 y.o. female Date: 09/14/2018 Primary Care Physican: Hoyt Koch, MD Primary Cardiologist: Smith/Bensimhon Electrophysiologist: Caryl Comes LastWeight:195.6lbs 3/18/2020Weight:192 lbs 09/02/2018 Weight:Unknown   Attempted call to patient and unable to reach. Left detailed message per DPR regarding transmission. Transmission reviewed.   Symptoms:  Lip Swelling - PCP is concerned may be angioedema from [facility] dosage that was increased a couple of months ago.    09/13/2018 Alert forHeartLogic Heart Failure Indexhasincreased to 49 from 32 since 09/06/2018 suggesting fluid accumulation.  Index has been rising steadily since 08/12/2018.  Prescribed:Torsemide20 mgTake 2 tablets (40 mg total) by mouth daily. Can take extra Torsemide as needed and Dr Haroldine Laws has discussed sliding scale.  Labs: 07/04/2018 Creatinine0.98, BUN10, Potassium3.4, Sodium141, GFR>60 06/06/2018 Creatinine0.93, BUN7, Potassium 3.7, Sodium138, GFR>60  05/27/2018 Creatinine1.02, BUN8, Potassium 3.5, Sodium137, GFR>60 05/19/2018 Creatinine 0.76, BUN >5, Potassium 3.7, Sodium 139, GFR >60 01/14/2018 Creatinine 0.90, BUN 12, Potassium 3.7, Sodium 139, GFR 87.56 11/24/2017 Creatinine1.02, BUN12, Potassium3.9, Sodium137, GFR>60 11/23/2017 Creatinine1.08, BUN11, Potassium3.5, Sodium138, GFR>60  11/22/2017 Creatinine0.84, BUN8, Potassium 3.3, Sodium140, GFR>60  A complete set of results can be found in Results Review  Recommendations: Left voice mail with ICM number and encouraged to call back.  Follow-up plan: ICM clinic phone appointment on4/28/2020to recheck fluid levels and will monitor for alerts.  Copy of ICM check sent to Dr. [name] Dr Haroldine Laws.  3 Month Trend    8 Day Data Trend            [name], RN 09/14/2018 7:43 AM

## 2018-09-14 NOTE — Telephone Encounter (Signed)
Remote ICM transmission received.  Attempted call to patient regarding ICM remote transmission and left message, per DPR, to return call regarding transmission.

## 2018-09-14 NOTE — Progress Notes (Signed)
Attempted 2nd call to patient and no answer.

## 2018-09-15 ENCOUNTER — Telehealth (HOSPITAL_COMMUNITY): Payer: Self-pay | Admitting: *Deleted

## 2018-09-15 NOTE — Telephone Encounter (Signed)
-----   Message from Hoyt Koch, MD sent at 09/14/2018  7:59 AM EDT ----- Fine for you guys to place the order for the losartan and if you can call and let her know when to start, thanks. Dr. Sharlet Salina ----- Message ----- From: Jolaine Artist, MD Sent: 09/13/2018   4:08 PM EDT To: [name], RN, Hoyt Koch, MD  Hmm. Thanks. Agree - think we have to stop Entresto. Risk of angioedema 2.2% in [place]. (similar to ACE-I). Risk of crossover to ARB alone is only 10% so lets give her a few weeks off and then try and start losartan 50mg  daily  I have cc'd my nurse who can also order if you prefer [place] to do that.   ----- Message ----- From: Hoyt Koch, MD Sent: 09/13/2018  10:31 AM EDT To: Jolaine Artist, MD  Yes, I did a video visit with her and the lips were definitely swollen. Cause is uncertain as she has other food allergies but she does not admit to any new foods recently. I have asked her to stop entresto until hearing back from [place]. [name] ----- Message ----- From: Jolaine Artist, MD Sent: 09/13/2018  10:04 AM EDT To: Hoyt Koch, MD  Has anyone evaluated her? If we think it is truly angioedema then she needs to stay off Entresto completely. If not angioedema then can continue. It is an important medication for her. Did anyone do a tele-video visit with her to evaluate while she was on the mediation to look?    ----- Message ----- From: Hoyt Koch, MD Sent: 09/12/2018   1:52 PM EDT To: Jolaine Artist, MD  Mutual patient: on entresto and doseage increased early Feb with new swelling lips in the last day. No tongue swelling or SOB but concern that this represents angioedema. I have asked her to stop entresto until she hears back from [place] are you okay with holding this? Do you want to replace it, if so with what? Thanks, [name]

## 2018-09-15 NOTE — Telephone Encounter (Signed)
Attempted to call pt about starting on Losartan in a couple of weeks and Left message to call back

## 2018-09-19 ENCOUNTER — Other Ambulatory Visit: Payer: Self-pay

## 2018-09-19 ENCOUNTER — Ambulatory Visit (INDEPENDENT_AMBULATORY_CARE_PROVIDER_SITE_OTHER): Payer: HMO

## 2018-09-19 ENCOUNTER — Telehealth: Payer: Self-pay

## 2018-09-19 DIAGNOSIS — Z95810 Presence of automatic (implantable) cardiac defibrillator: Secondary | ICD-10-CM | POA: Diagnosis not present

## 2018-09-19 DIAGNOSIS — I5042 Chronic combined systolic (congestive) and diastolic (congestive) heart failure: Secondary | ICD-10-CM

## 2018-09-19 NOTE — Telephone Encounter (Signed)
Remote ICM transmission received.  Attempted call to patient regarding ICM remote transmission and left detailed message, per DPR, to return call.    

## 2018-09-19 NOTE — Progress Notes (Signed)
EPIC Encounter for ICM Monitoring  Patient Name: [name] is a 43 y.o. female Date: 09/19/2018 Primary Care Physican: Hoyt Koch, MD Primary Cardiologist: Smith/Bensimhon Electrophysiologist: Caryl Comes LastWeight:195.6lbs 3/18/2020Weight:192 lbs 09/02/2018 Weight:Unknown   Attempted call to patient and unable to reach. Left detailed message per DPR regarding transmission. Transmission reviewed.  PCP consulted with Dr Haroldine Laws to discontinue Entresto due to patient has lip swelling that could be possibly be angioedema.  HF clinic attempted to reach patient to advise not to restart Entresto and will need to start Losartan in a couple of weeks.  09/13/2018 Alert forHeartLogic Heart Failure Indexhasincreasedto 54 suggesting fluid accumulation.  Index has been steadily rising above normal since 08/18/2018.  Prescribed:Torsemide20 mgTake 2 tablets (40 mg total) by mouth daily. Can take extra Torsemide as needed and Dr Haroldine Laws has discussed sliding scale.  Labs: 07/04/2018 Creatinine0.98, BUN10, Potassium3.4, Sodium141, GFR>60 06/06/2018 Creatinine0.93, BUN7, Potassium 3.7, Sodium138, GFR>60  05/27/2018 Creatinine1.02, BUN8, Potassium 3.5, Sodium137, GFR>60 05/19/2018 Creatinine 0.76, BUN >5, Potassium 3.7, Sodium 139, GFR >60 01/14/2018 Creatinine 0.90, BUN 12, Potassium 3.7, Sodium 139, GFR 87.56 11/24/2017 Creatinine1.02, BUN12, Potassium3.9, Sodium137, GFR>60 11/23/2017 Creatinine1.08, BUN11, Potassium3.5, Sodium138, GFR>60  11/22/2017 Creatinine0.84, BUN8, Potassium 3.3, Sodium140, GFR>60  A complete set of results can be found in Results Review  Recommendations: Left voice mail with ICM number and encouraged to call back.  Follow-up plan: ICM clinic phone appointment on5/4/2020to recheck fluid levelsand will monitor for alerts.  Copy of ICM  check sent to Dr. [name] Dr Haroldine Laws.    [place], RN 09/19/2018 8:21 AM

## 2018-09-20 ENCOUNTER — Other Ambulatory Visit: Payer: Self-pay

## 2018-09-20 ENCOUNTER — Ambulatory Visit (INDEPENDENT_AMBULATORY_CARE_PROVIDER_SITE_OTHER): Payer: HMO | Admitting: *Deleted

## 2018-09-20 DIAGNOSIS — I5042 Chronic combined systolic (congestive) and diastolic (congestive) heart failure: Secondary | ICD-10-CM | POA: Diagnosis not present

## 2018-09-20 DIAGNOSIS — I428 Other cardiomyopathies: Secondary | ICD-10-CM

## 2018-09-21 LAB — CUP PACEART REMOTE DEVICE CHECK
Date Time Interrogation Session: 20200429075136
Implantable Lead Implant Date: 20180312
Implantable Lead Location: 753860
Implantable Lead Model: 293
Implantable Lead Serial Number: 422842
Implantable Pulse Generator Implant Date: 20180312
Pulse Gen Serial Number: 226601

## 2018-09-26 ENCOUNTER — Ambulatory Visit (HOSPITAL_COMMUNITY)
Admission: RE | Admit: 2018-09-26 | Discharge: 2018-09-26 | Disposition: A | Discharge status: Home / Self Care | Payer: HMO | Source: Ambulatory Visit | Attending: Internal Medicine | Admitting: Internal Medicine

## 2018-09-26 ENCOUNTER — Ambulatory Visit (INDEPENDENT_AMBULATORY_CARE_PROVIDER_SITE_OTHER): Payer: HMO

## 2018-09-26 ENCOUNTER — Other Ambulatory Visit: Payer: Self-pay

## 2018-09-26 ENCOUNTER — Encounter (HOSPITAL_COMMUNITY): Payer: Self-pay

## 2018-09-26 VITALS — Wt 194.0 lb

## 2018-09-26 DIAGNOSIS — I5042 Chronic combined systolic (congestive) and diastolic (congestive) heart failure: Secondary | ICD-10-CM

## 2018-09-26 DIAGNOSIS — Z95810 Presence of automatic (implantable) cardiac defibrillator: Secondary | ICD-10-CM

## 2018-09-26 DIAGNOSIS — I428 Other cardiomyopathies: Secondary | ICD-10-CM | POA: Diagnosis not present

## 2018-09-26 NOTE — Patient Instructions (Signed)
Take an extra 20 mg Torsemide + extra 40 meq of potassium for 2 days.   Plan to check BMET and magnesium level this week.  We will only contact you if something comes back abnormal or we need to make some changes. Otherwise no news is good news!  Recommended follow-up: Follow up in 4 weeks with an ECHO.    Your physician has requested that you have an echocardiogram. Echocardiography is a painless test that uses sound waves to create images of your heart. It provides your doctor with information about the size and shape of your heart and how well your heart's chambers and valves are working. This procedure takes approximately one hour. There are no restrictions for this procedure.  Follow up in 6 months with Dr Vaughan Browner.

## 2018-09-26 NOTE — Addendum Note (Signed)
Encounter addended by: Valeda Malm, RN on: 09/26/2018 4:18 PM  Actions taken: Order list changed, Diagnosis association updated, Clinical Note Signed

## 2018-09-26 NOTE — Progress Notes (Signed)
Heart Failure TeleHealth Note  Due to national recommendations of social distancing due to [place] 19, Audio/video telehealth visit is felt to be most appropriate for this patient at this time.  See MyChart message from today for patient consent regarding telehealth for [facility].  Date:  09/26/2018   ID:  [name], DOB November 19, 1975, MRN [id number]  Location: Home  Provider location: [place] [facility] Type of Visit: Established patient   PCP:  Hoyt Koch, MD  Cardiologist:  No primary care provider on file. Primary HF: Dr Haroldine Laws   Chief Complaint: Heart Failure   History of Present Illness: [name] is a 43 y.o. female with a history of hypertension, morbid obesity s/p gastric sleeve 08/2017, diabetes mellitus, IBS, depression, anxiety, OSA on CPAP until June 2019,  DVT not on anticoagulants, chronic combined systolic and diastolic CHF thought to be due to peri-partum CM with onset in 1999 , Tubal ligation, and s/p [employer] ICD.   [name] is a 43 y.o. 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   Was admitted 12/5-11/2016 with recurrent HF, URI, CP and elevated BP (160/110). CT chest no PE. Diuresed from 233-> 228 pounds.   Admitted 11/2017 with CP described as "pounding". Troponins were negative. Symptoms thought to be related to frequent PVCs. EP consulted and scheduled her for PVC ablation. Echo repeated 11/22/17 and showed improved EF 50-55%. HF team consulted and changed torsemide to 40 mg daily (from 3x/week).   Underwent PVC ablation 12/01/17. Was felt not to be successful. Now on flecainide.   She presents today for regular follow up. Last visit,  Entresto increased. Feeling OK overall. Remains fatigued as primary complaint. Plans on exercising more in March as her insurance will start to cover [employer]. Just got back from travelling to [place] back and forth for family/grandfathers funeral. Knee pops when she goes down stairs, but otherwise is table with brace. Taking torsemide 40 mg daily. Hasn't needed extra in a long time.    ICD interrogation 09/10/08 --->. Heart Logic score 43. Activity level 0.9 hours, incline 66, thoracic impedance 43.    She presents via Engineer, civil (consulting) for a telehealth visit today.   Last visit EF was up to 50% so digoxin was stopped. Overall feeling fine. Says she is limited because she broke her ankle in February.  Wearing an air cast. Having good and bad days. Currently on her menstrual cycle and feels bloated.  Denies SOB/PND/Orthopnea. Appetite ok. Eating lots of fruit.  Drinking lots of protein shakes and fluids. No fever or chills. Weight at home 194-198 pounds. Says she has cramps after she takes afternoon torsemide. Taking all medications.   she denies symptoms worrisome for COVID 19.     I discussed today. ICD interrogated. Heart Logic score 43. Activity level 0.9 hours, incline 66, thoracic impedance 43.   Past Medical History:  Diagnosis Date  . Acute on chronic systolic (congestive) heart failure ([place]) 04/29/2017  . Acute pain of right shoulder 06/16/2017  . AICD (automatic cardioverter/defibrillator) present   . Anxiety   . Arthritis    right shoulder   . Asthma   . CHF (congestive heart failure) ([place])   .  Chronic combined systolic and diastolic heart failure ([place]) 03/05/2014  . Closed low lateral malleolus fracture 10/23/2013  . Cystitis 10/21/2017  . Depression   . Depression with anxiety 01/20/2013  . Diabetes mellitus without complication ([place])   . Diverticulosis   . Dyspnea    comes and goes intermittently mostly with exertion   . Essential hypertension    Prev followed by [facility]  [facility]/ Cardiology   . Fibroid    age 76  . Gallstones   . Generalized abdominal cramping   . History of cardiomyopathy   . Hypertension   . IBS (irritable bowel syndrome)   . 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 Insomnia 05/07/2017  . Labral tear of shoulder 04/04/2015    Injected 04/04/2015 Injected 12/03/2015   . Migraine    "monthly" (08/03/2016)  . Myofascial pain 06/16/2017  . NICM (nonischemic cardiomyopathy) ([place]) 08/03/2016  . Nonallopathic lesion of lumbosacral region 11/16/2016  . Nonallopathic lesion of sacral region 11/16/2016  . Nonallopathic lesion of thoracic region 08/20/2014  . Nonspecific chest pain 04/28/2017  . OSA (obstructive sleep apnea) 01/02/2013   NPSG 2009:  AHI 9/hr. CPAP intolerance >> "smothering" Good tolerance of auto device (optimal pressure 12-13 on download).  - referred to Dr Gwenette Greet    . OSA on CPAP   . Ovarian cyst    1999; surgically removed  . Patellofemoral syndrome of both knees 10/16/2016  . 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 PVC (premature ventricular contraction) 06/23/2016  . Seizures ([place])    "as a child" (08/03/2016)  . Termination of pregnancy    due to cardiac risk   Past Surgical History:  Procedure Laterality Date  . CARDIAC CATHETERIZATION N/A 11/11/2015   Procedure: Right/Left Heart Cath and Coronary Angiography;  Surgeon: Troy Sine, MD;  Location: [place] [facility];  Service: Cardiovascular;  Laterality: N/A;  . CARDIAC CATHETERIZATION  ~ 2015  . CARDIAC DEFIBRILLATOR PLACEMENT  08/03/2016  . CESAREAN SECTION  1999  . COLONOSCOPY WITH PROPOFOL N/A 04/21/2016   Procedure: COLONOSCOPY WITH PROPOFOL;  Surgeon: Jerene Bears, MD;  Location: [facility];  Service: Gastroenterology;  Laterality: N/A;  . ESOPHAGOGASTRODUODENOSCOPY (EGD) WITH PROPOFOL N/A 04/21/2016   Procedure: ESOPHAGOGASTRODUODENOSCOPY (EGD) WITH PROPOFOL;  Surgeon: Jerene Bears, MD;  Location: [facility]  ENDOS[facility];  Service: Gastroenterology;  Laterality: N/A;  . FOOT FRACTURE SURGERY Right ~ 2003  . FRACTURE SURGERY    . ICD IMPLANT N/A 08/03/2016   Procedure: ICD Implant;  Surgeon: Deboraha Sprang, MD;  Location: [place] [facility];  Service: Cardiovascular;  Laterality: N/A;  . LAPAROSCOPIC CHOLECYSTECTOMY  12/2006  . LAPAROSCOPIC GASTRIC SLEEVE RESECTION    . LAPAROSCOPY ABDOMEN DIAGNOSTIC  2008   "cut bile duct w/gallbladder OR; had to go in later & fix leak; hospitalized for 2 months"  . LEFT HEART CATHETERIZATION WITH CORONARY ANGIOGRAM N/A 02/26/2014   Procedure: LEFT HEART CATHETERIZATION WITH CORONARY ANGIOGRAM;  Surgeon: Jettie Booze, MD;  Location: [facility] CATH LAB;  Service: Cardiovascular;  Laterality: N/A;  . OVARIAN CYST REMOVAL Right 1999  . PVC ABLATION N/A 12/01/2017   Procedure: PVC ABLATION;  Surgeon: Evans Lance, MD;  Location: [place] [facility];  Service: Cardiovascular;  Laterality: N/A;  . RIGHT HEART CATH N/A 08/05/2017   Procedure: RIGHT HEART CATH;  Surgeon: Jolaine Artist, MD;  Location: [place] [facility];  Service: Cardiovascular;  Laterality: N/A;  . TUBAL LIGATION  1999     Current Outpatient Medications  Medication Sig Dispense Refill  . albuterol (PROVENTIL HFA;VENTOLIN HFA) 108 (90 Base) MCG/ACT inhaler Inhale 1-2 puffs into the lungs every 6 (six) hours as needed for wheezing or shortness of breath. 20.1 Inhaler 0  . amiodarone (PACERONE) 200 MG tablet Take 1 tablet (200 mg total) by mouth daily. 30 tablet 3  . blood glucose meter kit and supplies Dispense based on patient and insurance preference. Use up to two times daily as directed. (FOR ICD-10 E10.9, E11.9). 1 each 0  . diclofenac sodium (VOLTAREN) 1 % GEL Apply 4 g topically 4 (four) times daily. 300 g 6  . Diclofenac Sodium 2 % SOLN Place 2 g onto the skin 2 (two) times daily. 112 g 3  . dicyclomine (BENTYL) 20 MG tablet Take 20 mg by mouth 2 (two) times daily as needed for spasms.    .  fluticasone (FLONASE) 50 MCG/ACT nasal spray Place 2 sprays into both nostrils daily as needed for allergies. 48 g 0  . gabapentin (NEURONTIN) 100 MG capsule Take 200 mg by mouth at bedtime as needed (for pain).     . Ibuprofen-Famotidine 800-26.6 MG TABS Take 1 tablet by mouth 3 (three) times daily as needed. 90 tablet 3  . ivabradine (CORLANOR) 7.5 MG TABS tablet TAKE 1 TABLET (7.5 MG TOTAL) BY MOUTH 2 (TWO) TIMES DAILY WITH A MEAL. 180 tablet 2  . KLOR-CON M20 20 MEQ tablet TAKE 2 TABLETS (40 MEQ TOTAL) BY MOUTH DAILY. 180 tablet 3  . lidocaine (LIDODERM) 5 % Place 1 patch onto the skin daily. Remove & Discard patch within 12 hours or as directed by MD 30 patch 6  . linaclotide (LINZESS) 145 MCG CAPS capsule Take 1 capsule (145 mcg total) by mouth daily before breakfast. 30 capsule 3  . Multiple Vitamins-Minerals (MULTIVITAMIN GUMMIES WOMENS PO) Take 2 each by mouth daily.    . sacubitril-valsartan (ENTRESTO) 97-103 MG Take 1 tablet by mouth 2 (two) times daily. 60 tablet 6  . spironolactone (ALDACTONE) 25 MG tablet Take 1 tablet (25 mg total) by mouth daily. 90 tablet 2  . SYMBICORT 160-4.5 MCG/ACT inhaler INHALE 2 PUFFS INTO THE LUNGS 2 (TWO) TIMES DAILY AS NEEDED. 30.6 Inhaler 3  . torsemide (DEMADEX) 20 MG tablet Take 2 tablets (40 mg total) by mouth daily. 190 tablet 3  . Vitamin D, Ergocalciferol, (DRISDOL) 1.25 MG (50000 UT) CAPS capsule Take 1 capsule (50,000 Units total) by mouth every 7 (seven) days. 12 capsule 0  . zolpidem (AMBIEN) 5 MG tablet Take 1 tablet (5 mg total) by mouth at bedtime as needed for sleep. 30 tablet 3  . colestipol (COLESTID) 1 g tablet Take 2 tablets (2 g total) by mouth 2 (two) times daily. 120 tablet 1  . nitroGLYCERIN (NITROSTAT) 0.4 MG SL tablet Place 1 tablet (0.4 mg total) under the tongue every 5 (five) minutes as needed for chest pain. (Patient not taking: Reported on 09/26/2018) 75 tablet 0  . omeprazole (PRILOSEC) 20 MG capsule Take 20 mg by mouth daily.     . promethazine-dextromethorphan (PROMETHAZINE-DM) 6.25-15 MG/5ML syrup Take 5 mLs by mouth 4 (four) times daily as needed for cough. 100 mL 0   No current facility-administered medications for this encounter.     Allergies:   Aspirin; Vancomycin; Contrast media [iodinated diagnostic agents]; Avocado; Ciprofloxacin; and Sulfa antibiotics   Social History:  The patient  reports that she has never smoked. She has never used  smokeless tobacco. She reports that she does not drink alcohol or use drugs.   Family History:  The patient's family history includes Allergies in her daughter; Emphysema in her maternal grandmother; Heart disease (age of onset: 20) in her maternal grandmother; Rheum arthritis in her mother.   ROS:  Please see the history of present illness.   All other systems are personally reviewed and negative.   Exam:  Tele Health Call; Exam is subjective  General:  Speaks in full sentences. No resp difficulty. Lungs: Normal respiratory effort with conversation.  Abdomen: Non-distended per patient report Extremities: Pt denies edema. Neuro: Alert & oriented x 3.    Wt Readings from Last 3 Encounters:  09/26/18 88 kg (194 lb)  08/05/18 89.8 kg (198 lb)  07/27/18 90.7 kg (200 lb)    Recent Labs: 11/04/2017: Magnesium 2.1 01/14/2018: ALT 10 05/19/2018: B Natriuretic Peptide 395.9 05/27/2018: Hemoglobin 10.9; Platelets 202 07/20/2018: BUN 10; Creatinine, Ser 0.83; Potassium 3.7; Sodium 137  Personally reviewed   Wt Readings from Last 3 Encounters:  09/26/18 88 kg (194 lb)  08/05/18 89.8 kg (198 lb)  07/27/18 90.7 kg (200 lb)      ASSESSMENT AND PLAN:  1. Chronic systolic HF:  due to peri-partum CM, onset 1999. S/P [employer] ICD 07/2016 - EF 20-25% (12/17).  - Echo 8/18 EF 25-30% s/p ICD 3/18. [place] 3/19: RA 2, PA 28/13, Fick CO 5.7, CI 2.8 - CPX 12/18 showed moderate limitation due mostly to obesity with some HF component - Echo 11/22/17 LVEF 50-55%, Grade 1 DD, Mild  MR, Mod LAE - Echo 04/2018 by Dr Haroldine Laws EF20-25%. RV ok - Bedside Echo 06/2018  by Dr. Haroldine Laws with EF improved to 50%.  [facility] Scientific Score 43. Will need to repeat formal ECHO.  NYHA II. Volume status sounds elevated.  Continue torsemide 40 mg daily and she will take an extra 20 mg of torsemide and 40 meq potassium for the next 2 days.    - Continue Entresto 97-103 mg BID - Off bb in 04/2018 due to suspected low output.  - Continue Spiro 25 mg daily.  - Continue corlanor 7.20m BID for now.   - check BMET and Mag this week.  2. [facility]  Body mass index is 32.28 kg/m. 3. OSA - She has been able to stop CPAP per sleep study in June 2019.  4. Anxiety/depression - Per PCP. No change.  5. DM2 - A1c in Jan. 2018 is 5.3 with medications.  - Per PCP. Can consider Jardiance.  6. Frequent PVCs/ Bigeminy - Holter Monitor 11/09/17 with 30% PVCs with one primary morphology.  - S/p PVC ablation 11/2017 with Dr TLovena Leunsuccessful - Zio Patch 06/05/18 One short run NSVT with occasional PVCs - Continue amio 200 mg daily.     COVID screen The patient does not have any symptoms that suggest any further testing/ screening at this time.  Social distancing reinforced today.  Patient Risk: After full review of this patients clinical status, I feel that they are at moderate risk for cardiac decompensation at this time.  Relevant cardiac medications were reviewed at length with the patient today. The patient does not have concerns regarding their medications at this time.   The following changes were made today:  She was instructed to take an extra 20 mg torsemide + extra 40 meq of potassium for 2 days. Plan to check BMET and magnesium level this week.  Recommended follow-up:  Follow up in 4 weeks with an ECHO.  Follow up in 6 months with Dr Vaughan Browner but sooner if EF is back down.   Today, I have spent 22 minutes with the patient with  telehealth technology discussing the above issues .    Jeanmarie Hubert, NP  09/26/2018 3:22 PM  [place] [address] Heart and [place] [address] [phone] (office) [phone] (fax)

## 2018-09-26 NOTE — Progress Notes (Signed)
Virtual visit with Darrick Grinder, NP completed later than appt time today.  Message received from Amy.   Message  Received: Today  Message Contents  Clegg, Amy D, NP  Short, Laurie Panda, RN        I asked her to go ahead and take extra 20 mg torsemide + 40 meq k for 2 days then back to 40 mg torsemide a day.   Thks A   Previous Messages

## 2018-09-26 NOTE — Progress Notes (Signed)
Message  Received: Today  Message Contents  Clegg, Malachi Bonds, NP  Phylliss Strege Panda, RN        Please call her and ask her to increase torsemide to 60 mg daily.   Thanks Amy

## 2018-09-26 NOTE — Progress Notes (Signed)
LM for patient to call office to discuss AVS.

## 2018-09-26 NOTE — Progress Notes (Signed)
Spoke with pt.  She said she did not receive a link for todays virtual visit with Darrick Grinder, NP HF clinic.  She was planning on discussing several things with her.     1. She does not want to increase Torsemide due to leg cramps for past 3 weeks.  She confirms she takes 40 mEq of potassium.   2. She also thinks she is having a lot of PVC's because her heart is racing.   3. PCP discussed with Dr Haroldine Laws that she stopped Delene Loll because she thought pt could be having angioedema because of lip swelling.  Nira Conn tried to call her in April to start Losartan.  Today patient says she never stopped Entresto because she didn't think it was angioedema.   Advised patient I will send a message to Amy about rescheduling a virtual visit to address these issues.  She said she cannot tell she has any fluid symptoms.

## 2018-09-26 NOTE — Progress Notes (Signed)
EPIC Encounter for ICM Monitoring  Patient Name: [name] is a 43 y.o. female Date: 09/26/2018 Primary Care Physican: Hoyt Koch, MD Primary Cardiologist: Smith/Bensimhon Electrophysiologist: Caryl Comes LastWeight:195.6lbs 3/18/2020Weight:192 lbs 09/26/2018 Weight:Unknown   Transmission reviewed.  5/4/2020Alert forHeartLogic Heart Failure Indexis 43 which is a decrease from 54 suggesting fluid accumulation.  Prescribed:Torsemide20 mgTake 2 tablets (40 mg total) by mouth daily. Can take extra Torsemide as needed and Dr Haroldine Laws has discussed sliding scale.  Labs: 07/04/2018 Creatinine0.98, BUN10, Potassium3.4, Sodium141, GFR>60 06/06/2018 Creatinine0.93, BUN7, Potassium 3.7, Sodium138, GFR>60  05/27/2018 Creatinine1.02, BUN8, Potassium 3.5, Sodium137, GFR>60 05/19/2018 Creatinine 0.76, BUN >5, Potassium 3.7, Sodium 139, GFR >60 01/14/2018 Creatinine 0.90, BUN 12, Potassium 3.7, Sodium 139, GFR 87.56 11/24/2017 Creatinine1.02, BUN12, Potassium3.9, Sodium137, GFR>60 11/23/2017 Creatinine1.08, BUN11, Potassium3.5, Sodium138, GFR>60  11/22/2017 Creatinine0.84, BUN8, Potassium 3.3, Sodium140, GFR>60  A complete set of results can be found in Results Review  Recommendations:Will send copy to Darrick Grinder, NP HF clinic since patient has a virtual visit with her today.  Follow-up plan: ICM clinic phone appointment on5/11/2020to recheck fluid levelsandwillmonitor for alerts.   [place], RN 09/26/2018 2:16 PM

## 2018-09-27 ENCOUNTER — Encounter (HOSPITAL_COMMUNITY): Payer: Self-pay

## 2018-09-27 ENCOUNTER — Telehealth (HOSPITAL_COMMUNITY): Payer: Self-pay

## 2018-09-27 DIAGNOSIS — I5032 Chronic diastolic (congestive) heart failure: Secondary | ICD-10-CM

## 2018-09-27 NOTE — Addendum Note (Signed)
Encounter addended by: Valeda Malm, RN on: 09/27/2018 9:59 AM  Actions taken: Clinical Note Signed

## 2018-09-27 NOTE — Telephone Encounter (Signed)
Called pt back this morning to discuss AVS from yesterday. Pt given instructions and verbalized understanding. Pt will repeat bmet and mag tomorrow. Lab appt time given.

## 2018-09-27 NOTE — Telephone Encounter (Signed)
-----   Message from [place], NP sent at 09/26/2018  3:44 PM EDT ----- Regarding: avs She was instructed to take an extra 20 mg torsemide + extra 40 meq of potassium for 2 days.   Plan to check BMET and magnesium level this week.    Recommended follow-up:  Follow up in 4 weeks with an ECHO.    Follow up in 6 months with Dr Vaughan Browner but sooner if EF is back down.   Thks A

## 2018-09-27 NOTE — Progress Notes (Signed)
Spoke to patient this morning, discussed AVS.  Instructions sent via mychart.

## 2018-09-28 ENCOUNTER — Telehealth (HOSPITAL_COMMUNITY): Payer: Self-pay

## 2018-09-28 ENCOUNTER — Ambulatory Visit (HOSPITAL_COMMUNITY)
Admission: RE | Admit: 2018-09-28 | Discharge: 2018-09-28 | Disposition: A | Discharge status: Home / Self Care | Payer: HMO | Source: Ambulatory Visit | Attending: Internal Medicine | Admitting: Internal Medicine

## 2018-09-28 ENCOUNTER — Other Ambulatory Visit: Payer: Self-pay

## 2018-09-28 DIAGNOSIS — I5032 Chronic diastolic (congestive) heart failure: Secondary | ICD-10-CM | POA: Diagnosis not present

## 2018-09-28 LAB — BASIC METABOLIC PANEL
Anion gap: 9 (ref 5–15)
BUN: 11 mg/dL (ref 6–20)
CO2: 28 mmol/L (ref 22–32)
Calcium: 9.3 mg/dL (ref 8.9–10.3)
Chloride: 103 mmol/L (ref 98–111)
Creatinine, Ser: 0.9 mg/dL (ref 0.44–1.00)
GFR calc Af Amer: >60 mL/min (ref >60)
GFR calc non Af Amer: >60 mL/min (ref >60)
Glucose, Bld: 100 mg/dL — ABNORMAL HIGH (ref 70–99)
Potassium: 3.1 mmol/L — ABNORMAL LOW (ref 3.5–5.1)
Sodium: 140 mmol/L (ref 135–145)

## 2018-09-28 LAB — MAGNESIUM: Magnesium: 2 mg/dL (ref 1.7–2.4)

## 2018-09-28 NOTE — Telephone Encounter (Signed)
Relayed message per Amy: Potassium low. Increase potassium to 40 meq twice a day. Renal function stable Pt verbalized understanding.

## 2018-09-29 NOTE — Progress Notes (Signed)
Remote ICD transmission.   

## 2018-10-03 ENCOUNTER — Ambulatory Visit (INDEPENDENT_AMBULATORY_CARE_PROVIDER_SITE_OTHER): Payer: HMO

## 2018-10-03 ENCOUNTER — Other Ambulatory Visit: Payer: Self-pay

## 2018-10-03 DIAGNOSIS — Z95810 Presence of automatic (implantable) cardiac defibrillator: Secondary | ICD-10-CM

## 2018-10-03 DIAGNOSIS — I5032 Chronic diastolic (congestive) heart failure: Secondary | ICD-10-CM

## 2018-10-03 NOTE — Progress Notes (Signed)
EPIC Encounter for ICM Monitoring  Patient Name: [name] is a 43 y.o. female Date: 10/03/2018 Primary Care Physican: Hoyt Koch, MD Primary Cardiologist: Smith/Bensimhon Electrophysiologist: Caryl Comes LastWeight:195.6lbs 3/18/2020Weight:192 lbs 10/03/2018 Weight:Unknown   Attempted call to patient and unable to reach.  Left detailed message per DPR regarding transmission. Transmission reviewed.   HeartLogic Heart Failure Indexdecreased to 6 from 43.  Index has returned to normal after taking extra Torsemide.   Prescribed:Torsemide20 mgTake 2 tablets (40 mg total) by mouth daily. Can take extra Torsemide as needed and Dr Haroldine Laws has discussed sliding scale.  Labs: 07/04/2018 Creatinine0.98, BUN10, Potassium3.4, Sodium141, GFR>60 06/06/2018 Creatinine0.93, BUN7, Potassium 3.7, Sodium138, GFR>60  05/27/2018 Creatinine1.02, BUN8, Potassium 3.5, Sodium137, GFR>60 05/19/2018 Creatinine 0.76, BUN >5, Potassium 3.7, Sodium 139, GFR >60 01/14/2018 Creatinine 0.90, BUN 12, Potassium 3.7, Sodium 139, GFR 87.56 11/24/2017 Creatinine1.02, BUN12, Potassium3.9, Sodium137, GFR>60 11/23/2017 Creatinine1.08, BUN11, Potassium3.5, Sodium138, GFR>60  11/22/2017 Creatinine0.84, BUN8, Potassium 3.3, Sodium140, GFR>60  A complete set of results can be found in Results Review  Recommendations: Left voice mail with ICM number and encouraged to call if experiencing any fluid symptoms.  Follow-up plan: ICM clinic phone appointment on 10/24/2018.  Office appointment scheduled 11/01/2018 with Dr. Haroldine Laws.        Copy of ICM check sent to Dr. Caryl Comes.     [place], RN 10/03/2018 8:55 AM

## 2018-10-03 NOTE — Progress Notes (Deleted)
[name] [place] [place], [place] [address] Phone: [phone] Subjective:    I'm seeing this patient by the request  of:    CC: back and neck pain   CHY:[id number]  [name] is a 43 y.o. female coming in with complaint of ***  Onset-  Location Duration-  Character- Aggravating factors- Reliving factors-  Therapies tried-  Severity-     Past Medical History:  Diagnosis Date  . Acute on chronic systolic (congestive) heart failure ([place]) 04/29/2017  . Acute pain of right shoulder 06/16/2017  . AICD (automatic cardioverter/defibrillator) present   . Anxiety   . Arthritis    right shoulder   . Asthma   . CHF (congestive heart failure) ([place])   . Chronic combined systolic and diastolic heart failure ([place]) 03/05/2014  . Closed low lateral malleolus fracture 10/23/2013  . Cystitis 10/21/2017  . Depression   . Depression with anxiety 01/20/2013  . Diabetes mellitus without complication ([place])   . Diverticulosis   . Dyspnea    comes and goes intermittently mostly with exertion   . Essential hypertension    Prev followed by [facility]/ Cardiology   . Fibroid    age 60  . Gallstones   . Generalized abdominal cramping   . History of cardiomyopathy   . Hypertension   . IBS (irritable bowel syndrome)   . 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 Insomnia 05/07/2017  . Labral tear of shoulder 04/04/2015    Injected 04/04/2015 Injected 12/03/2015   . Migraine    "monthly" (08/03/2016)  . Myofascial pain 06/16/2017  . NICM (nonischemic cardiomyopathy) ([place]) 08/03/2016  . Nonallopathic lesion of lumbosacral region 11/16/2016  . Nonallopathic lesion of sacral region 11/16/2016  . Nonallopathic lesion of thoracic region 08/20/2014  . Nonspecific chest pain 04/28/2017  . OSA (obstructive sleep apnea) 01/02/2013   NPSG 2009:  AHI 9/hr. CPAP intolerance >> "smothering" Good tolerance of auto device (optimal pressure  12-13 on download).  - referred to Dr Gwenette Greet    . OSA on CPAP   . Ovarian cyst    1999; surgically removed  . Patellofemoral syndrome of both knees 10/16/2016  . 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 PVC (premature ventricular contraction) 06/23/2016  . Seizures ([place])    "as a child" (08/03/2016)  . Termination of pregnancy    due to cardiac risk   Past Surgical History:  Procedure Laterality Date  . CARDIAC CATHETERIZATION N/A 11/11/2015   Procedure: Right/Left Heart Cath and Coronary Angiography;  Surgeon: Troy Sine, MD;  Location: [facility];  Service: Cardiovascular;  Laterality: N/A;  . CARDIAC CATHETERIZATION  ~ 2015  . CARDIAC DEFIBRILLATOR PLACEMENT  08/03/2016  . CESAREAN SECTION  1999  . COLONOSCOPY WITH PROPOFOL N/A 04/21/2016   Procedure: COLONOSCOPY WITH PROPOFOL;  Surgeon: Jerene Bears, MD;  Location: [facility];  Service: Gastroenterology;  Laterality: N/A;  . ESOPHAGOGASTRODUODENOSCOPY (EGD) WITH PROPOFOL N/A 04/21/2016   Procedure: ESOPHAGOGASTRODUODENOSCOPY (EGD) WITH PROPOFOL;  Surgeon: Jerene Bears, MD;  Location: [facility];  Service: Gastroenterology;  Laterality: N/A;  . FOOT FRACTURE SURGERY Right ~ 2003  . FRACTURE SURGERY    . ICD IMPLANT N/A 08/03/2016   Procedure: ICD Implant;  Surgeon: Deboraha Sprang, MD;  Location: [facility];  Service: Cardiovascular;  Laterality: N/A;  . LAPAROSCOPIC CHOLECYSTECTOMY  12/2006  . LAPAROSCOPIC GASTRIC SLEEVE RESECTION    .  LAPAROSCOPY ABDOMEN DIAGNOSTIC  2008   "cut bile duct w/gallbladder OR; had to go in later & fix leak; hospitalized for 2 months"  . LEFT HEART CATHETERIZATION WITH CORONARY ANGIOGRAM N/A 02/26/2014   Procedure: LEFT HEART CATHETERIZATION WITH CORONARY ANGIOGRAM;  Surgeon: Jettie Booze, MD;  Location: [facility] C[facility] LAB;  Service: Cardiovascular;  Laterality: N/A;  . OVARIAN CYST REMOVAL Right 1999  . PVC ABLATION N/A 12/01/2017   Procedure: PVC ABLATION;   Surgeon: Evans Lance, MD;  Location: [facility];  Service: Cardiovascular;  Laterality: N/A;  . RIGHT HEART CATH N/A 08/05/2017   Procedure: RIGHT HEART CATH;  Surgeon: Jolaine Artist, MD;  Location: [facility];  Service: Cardiovascular;  Laterality: N/A;  . TUBAL LIGATION  1999   Social History   Socioeconomic History  . Marital status: Married    Spouse name: Not on file  . Number of children: 1  . Years of education: Not on file  . Highest education level: Not on file  Occupational History  . Occupation: stay at home mom  Social Needs  . Financial resource strain: Not on file  . Food insecurity:    Worry: Not on file    Inability: Not on file  . Transportation needs:    Medical: Not on file    Non-medical: Not on file  Tobacco Use  . Smoking status: Never Smoker  . Smokeless tobacco: Never Used  Substance and Sexual Activity  . Alcohol use: No  . Drug use: No  . Sexual activity: Yes  Lifestyle  . Physical activity:    Days per week: Not on file    Minutes per session: Not on file  . Stress: Not on file  Relationships  . Social connections:    Talks on phone: Not on file    Gets together: Not on file    Attends religious service: Not on file    Active member of club or organization: Not on file    Attends meetings of clubs or organizations: Not on file    Relationship status: Not on file  Other Topics Concern  . Not on file  Social History Narrative  . Not on file   Allergies  Allergen Reactions  . Aspirin Anaphylaxis and Other (See Comments)    Wheezing, "but patient still takes if she needs to"  . Vancomycin Other (See Comments)    "did something to my kidneys," PROGRESSED TO KIDNEY FAILURE!!  . Contrast Media [Iodinated Diagnostic Agents] Other (See Comments)    Multiple CT contrast studies done over 2 weeks caused ARF  . Avocado Itching and Swelling  . Ciprofloxacin Itching and Rash  . Sulfa Antibiotics Itching and Rash    Family History  Problem Relation Age of Onset  . Emphysema Maternal Grandmother        smoked  . Heart disease Maternal Grandmother 39       MI  . Rheum arthritis Mother   . Allergies Daughter   . Colon cancer Neg Hx      Current Outpatient Medications (Cardiovascular):  .  amiodarone (PACERONE) 200 MG tablet, Take 1 tablet (200 mg total) by mouth daily. .  colestipol (COLESTID) 1 g tablet, Take 2 tablets (2 g total) by mouth 2 (two) times daily. .  ivabradine (CORLANOR) 7.5 MG TABS tablet, TAKE 1 TABLET (7.5 MG TOTAL) BY MOUTH 2 (TWO) TIMES DAILY WITH A MEAL. .  nitroGLYCERIN (NITROSTAT) 0.4 MG SL tablet, Place 1  tablet (0.4 mg total) under the tongue every 5 (five) minutes as needed for chest pain. (Patient not taking: Reported on 09/26/2018) .  sacubitril-valsartan (ENTRESTO) 97-103 MG, Take 1 tablet by mouth 2 (two) times daily. Marland Kitchen  spironolactone (ALDACTONE) 25 MG tablet, Take 1 tablet (25 mg total) by mouth daily. Marland Kitchen  torsemide (DEMADEX) 20 MG tablet, Take 2 tablets (40 mg total) by mouth daily.  Current Outpatient Medications (Respiratory):  .  albuterol (PROVENTIL HFA;VENTOLIN HFA) 108 (90 Base) MCG/ACT inhaler, Inhale 1-2 puffs into the lungs every 6 (six) hours as needed for wheezing or shortness of breath. .  fluticasone (FLONASE) 50 MCG/ACT nasal spray, Place 2 sprays into both nostrils daily as needed for allergies. .  promethazine-dextromethorphan (PROMETHAZINE-DM) 6.25-15 MG/5ML syrup, Take 5 mLs by mouth 4 (four) times daily as needed for cough. .  SYMBICORT 160-4.5 MCG/ACT inhaler, INHALE 2 PUFFS INTO THE LUNGS 2 (TWO) TIMES DAILY AS NEEDED.  Current Outpatient Medications (Analgesics):  Marland Kitchen  Ibuprofen-Famotidine 800-26.6 MG TABS, Take 1 tablet by mouth 3 (three) times daily as needed.   Current Outpatient Medications (Other):  .  blood glucose meter kit and supplies, Dispense based on patient and insurance preference. Use up to two times daily as directed. (FOR ICD-10  E10.9, E11.9). Marland Kitchen  diclofenac sodium (VOLTAREN) 1 % GEL, Apply 4 g topically 4 (four) times daily. .  Diclofenac Sodium 2 % SOLN, Place 2 g onto the skin 2 (two) times daily. Marland Kitchen  dicyclomine (BENTYL) 20 MG tablet, Take 20 mg by mouth 2 (two) times daily as needed for spasms. Marland Kitchen  gabapentin (NEURONTIN) 100 MG capsule, Take 200 mg by mouth at bedtime as needed (for pain).  Marland Kitchen  KLOR-CON M20 20 MEQ tablet, TAKE 2 TABLETS (40 MEQ TOTAL) BY MOUTH DAILY. Marland Kitchen  lidocaine (LIDODERM) 5 %, Place 1 patch onto the skin daily. Remove & Discard patch within 12 hours or as directed by MD .  linaclotide (LINZESS) 145 MCG CAPS capsule, Take 1 capsule (145 mcg total) by mouth daily before breakfast. .  Multiple Vitamins-Minerals (MULTIVITAMIN GUMMIES WOMENS PO), Take 2 each by mouth daily. Marland Kitchen  omeprazole (PRILOSEC) 20 MG capsule, Take 20 mg by mouth daily. .  Vitamin D, Ergocalciferol, (DRISDOL) 1.25 MG (50000 UT) CAPS capsule, Take 1 capsule (50,000 Units total) by mouth every 7 (seven) days. Marland Kitchen  zolpidem (AMBIEN) 5 MG tablet, Take 1 tablet (5 mg total) by mouth at bedtime as needed for sleep.    Past medical history, social, surgical and family history all reviewed in electronic medical record.  No pertanent information unless stated regarding to the chief complaint.   Review of Systems:  No headache, visual changes, nausea, vomiting, diarrhea, constipation, dizziness, abdominal pain, skin rash, fevers, chills, night sweats, weight loss, swollen lymph nodes, body aches, joint swelling,chest pain, shortness of breath, mood changes.  Positive muscle aches  Objective  There were no vitals taken for this visit. Systems examined below as of    General: No apparent distress alert and oriented x3 mood and affect normal, dressed appropriately.  HEENT: Pupils equal, extraocular movements intact  Respiratory: [name] speak in full sentences and does not appear short of breath  Cardiovascular: No lower extremity edema, non  tender, no erythema  Skin: Warm dry intact with no signs of infection or rash on extremities or on axial skeleton.  Abdomen: Soft nontender  Neuro: Cranial nerves II through XII are intact, neurovascularly intact in all extremities with 2+ DTRs and 2+ pulses.  Lymph: No lymphadenopathy of posterior or anterior cervical chain or axillae bilaterally.  Gait normal with good balance and coordination.  MSK:  Non tender with full range of motion and good stability and symmetric strength and tone of shoulders, elbows, wrist, hip, knee and ankles bilaterally.    Osteopathic findings Cervical C2 flexed rotated and side bent right C4 flexed rotated and side bent left C6 flexed rotated and side bent left T3 extended rotated and side bent right inhaled third rib T9 extended rotated and side bent left L2 flexed rotated and side bent right Sacrum right on right    Impression and Recommendations:     This case required medical decision making of moderate complexity. The above documentation has been reviewed and is accurate and complete Lyndal Pulley, DO       Note: This dictation was prepared with Dragon dictation along with smaller phrase technology. Any transcriptional errors that result from this process are unintentional.

## 2018-10-04 ENCOUNTER — Ambulatory Visit: Payer: HMO | Admitting: Family Medicine

## 2018-10-07 ENCOUNTER — Other Ambulatory Visit: Payer: Self-pay

## 2018-10-07 NOTE — Patient Outreach (Signed)
  [facility]) Care Management Chronic Special Needs Program  10/07/2018  Name: [name] DOB: December 08, 1975  MRN: [id number]  [name] is enrolled in a chronic special needs plan for Heart Failure. Chronic Care Management Coordinator telephoned client to review health risk assessment and to develop individualized care plan.  Introduced the chronic care management program, importance of client participation, and taking their care plan to all provider appointments and inpatient facilities.   Subjective: Client reports history of heart failure, heart disease, asthma, gastric sleeve. Client states overwhelmed due to heart condition. " it is stressful, I do feel overwhelmed sometimes. I have to worry about what I eat and how much I drink and going to the bathroom, when I take my medicine. I cant just go out to the store right after I take my medicine. Client reports feeling better after having defibrillator placed and reports some reassurance with remote fluid volume monitoring system in place. Mrs. Lovick states she has also had gastric sleeve surgery that sometimes affects her fluid intake. She reports she weighs herself daily, but does not record weights.  Client is receptive to social work referral regarding feeling overwhelmed. She is also receptive to pharmacy referral for medication review and recommendation for medication management and information on pill packs. Currently, client's daughter is packaging client's medications in little bags for her.    Client reports loosing her balance and fracturing her left ankle last month-being followed by [facility].     Goals Addressed            This Visit's Progress   .  Acknowledge receipt of Programme researcher, broadcasting/film/video      . Advanced Care Planning complete as directed by client..      . Client understands the importance of follow-up with providers by attending scheduled visits      . Client will report no  fall or injuries in the next 3 months.      . Client will report no worsening of symptoms related to heart disease within the next 3 months       . Client will verbalize knowledge of Heart Failure disease self management skills within the next 3-6 months.       Follow up with cardiologist regarding fluid recommendations. Begin to track weight by recording weights.    . Client will verbalize knowledge of self management of Hypertension as evidences by BP reading of 140/90 or less; or as defined by provider      . Maintain timely refills of Heart Failure medication as prescribed within the year       . Obtain annual  Lipid Profile, LDL-C      . Visit Primary Care Provider or Cardiologist at least 2 times per year        Covid 19 precautions discussed. RNCM reinforced 24 hour nurse advice line; encouraged client to call health care concierge for benefit and in-network provider questions; Also encouraged to call RNCM as needed.   Plan:  Send successful outreach letter with a copy of their individualized care plan, Send individual care plan to provider and Send educational material, send advanced directive packet.  Chronic care management coordination will outreach in:  3 Months  Will refer client to:  Social Work and [place], [place], MSN, Helena Porter [phone]

## 2018-10-10 ENCOUNTER — Ambulatory Visit: Payer: Self-pay

## 2018-10-10 ENCOUNTER — Other Ambulatory Visit: Payer: Self-pay | Admitting: Pharmacist

## 2018-10-10 ENCOUNTER — Encounter: Payer: Self-pay | Admitting: Family Medicine

## 2018-10-10 ENCOUNTER — Other Ambulatory Visit: Payer: Self-pay | Admitting: *Deleted

## 2018-10-10 ENCOUNTER — Ambulatory Visit (INDEPENDENT_AMBULATORY_CARE_PROVIDER_SITE_OTHER)
Admission: RE | Admit: 2018-10-10 | Discharge: 2018-10-10 | Disposition: A | Discharge status: Home / Self Care | Payer: HMO | Source: Ambulatory Visit | Attending: Family Medicine | Admitting: Family Medicine

## 2018-10-10 ENCOUNTER — Other Ambulatory Visit: Payer: Self-pay

## 2018-10-10 ENCOUNTER — Ambulatory Visit: Payer: HMO | Admitting: Family Medicine

## 2018-10-10 ENCOUNTER — Encounter: Payer: Self-pay | Admitting: *Deleted

## 2018-10-10 ENCOUNTER — Ambulatory Visit (INDEPENDENT_AMBULATORY_CARE_PROVIDER_SITE_OTHER): Payer: HMO | Admitting: Family Medicine

## 2018-10-10 VITALS — BP 138/84 | HR 72 | Ht 65.0 in | Wt 194.0 lb

## 2018-10-10 DIAGNOSIS — M25572 Pain in left ankle and joints of left foot: Secondary | ICD-10-CM

## 2018-10-10 DIAGNOSIS — G8929 Other chronic pain: Secondary | ICD-10-CM

## 2018-10-10 DIAGNOSIS — M7989 Other specified soft tissue disorders: Secondary | ICD-10-CM | POA: Diagnosis not present

## 2018-10-10 NOTE — Patient Outreach (Signed)
[facility]) Care Management  10/10/2018  [name] 1975/06/07 [id number]   CSW made an initial attempt to try and contact patient today to perform the initial phone assessment, as well as assess and assist with social work needs and services, without success.  A HIPAA compliant message was left for patient on voicemail.  CSW is currently awaiting a return call.  CSW will make a second outreach attempt within the next 3-4 business days, if a return call is not received from patient in the meantime.  CSW will also mail an Outreach Letter to patient's home requesting that patient contact CSW if patient is interested in receiving social work services through [place] with Scientist, clinical (histocompatibility and immunogenetics).  [name], BSW, MSW, LCSW  Licensed Education officer, environmental [facility]  Mailing [place] [address] [place], [place] [address] Physical Address-[address], [place], [place] [address] Toll Free Main # [phone] Fax # [phone] Cell # [phone]  Office # [phone] [name]

## 2018-10-10 NOTE — Progress Notes (Signed)
[name] [address] [place], [name] [address] Phone: [phone] Subjective:   [name], am serving as a scribe for Dr. Hulan Saas.   CC: Ankle pain  POE:[id number]  [name] is a 43 y.o. female coming in with complaint of left ankle pain. Patient states that she fell on April 1st. Has been wearing tall cam walker for 4 weeks. Patient said that she removed boot following 4 week time frame. Pain is constant. Boot does help with pain but does not fully alleviate. Has been using Duexis and uses compression socks.  Patient states has not made significant improvement overall though.       Past Medical History:  Diagnosis Date  . Acute on chronic systolic (congestive) heart failure ([place]) 04/29/2017  . Acute pain of right shoulder 06/16/2017  . AICD (automatic cardioverter/defibrillator) present   . Anxiety   . Arthritis    right shoulder   . Asthma   . CHF (congestive heart failure) ([place])   . Chronic combined systolic and diastolic heart failure ([place]) 03/05/2014  . Closed low lateral malleolus fracture 10/23/2013  . Cystitis 10/21/2017  . Depression   . Depression with anxiety 01/20/2013  . Diabetes mellitus without complication ([place])   . Diverticulosis   . Dyspnea    comes and goes intermittently mostly with exertion   . Essential hypertension    Prev followed by [facility] Anjali Manzella/ Cardiology   . Fibroid    age 54  . Gallstones   . Generalized abdominal cramping   . History of cardiomyopathy   . Hypertension   . IBS (irritable bowel syndrome)   . 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 Insomnia 05/07/2017  . Labral tear of shoulder 04/04/2015    Injected 04/04/2015 Injected 12/03/2015   . Migraine    "monthly" (08/03/2016)  . Myofascial pain 06/16/2017  . NICM (nonischemic cardiomyopathy) ([place]) 08/03/2016  . Nonallopathic lesion of lumbosacral region 11/16/2016  . Nonallopathic lesion of sacral region  11/16/2016  . Nonallopathic lesion of thoracic region 08/20/2014  . Nonspecific chest pain 04/28/2017  . OSA (obstructive sleep apnea) 01/02/2013   NPSG 2009:  AHI 9/hr. CPAP intolerance >> "smothering" Good tolerance of auto device (optimal pressure 12-13 on download).  - referred to Dr Gwenette Greet    . OSA on CPAP   . Ovarian cyst    1999; surgically removed  . Patellofemoral syndrome of both knees 10/16/2016  . 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 PVC (premature ventricular contraction) 06/23/2016  . Seizures ([place])    "as a child" (08/03/2016)  . Termination of pregnancy    due to cardiac risk   Past Surgical History:  Procedure Laterality Date  . CARDIAC CATHETERIZATION N/A 11/11/2015   Procedure: Right/Left Heart Cath and Coronary Angiography;  Surgeon: Troy Sine, MD;  Location: [facility];  Service: Cardiovascular;  Laterality: N/A;  . CARDIAC CATHETERIZATION  ~ 2015  . CARDIAC DEFIBRILLATOR PLACEMENT  08/03/2016  . CESAREAN SECTION  1999  . COLONOSCOPY WITH PROPOFOL N/A 04/21/2016   Procedure: COLONOSCOPY WITH PROPOFOL;  Surgeon: Jerene Bears, MD;  Location: [facility];  Service: Gastroenterology;  Laterality: N/A;  . ESOPHAGOGASTRODUODENOSCOPY (EGD) WITH PROPOFOL N/A 04/21/2016   Procedure: ESOPHAGOGASTRODUODENOSCOPY (EGD) WITH PROPOFOL;  Surgeon: Jerene Bears, MD;  Location: [facility];  Service: Gastroenterology;  Laterality: N/A;  . FOOT FRACTURE SURGERY Right ~ 2003  . FRACTURE SURGERY    .  ICD IMPLANT N/A 08/03/2016   Procedure: ICD Implant;  Surgeon: Deboraha Sprang, MD;  Location: [facility];  Service: Cardiovascular;  Laterality: N/A;  . LAPAROSCOPIC CHOLECYSTECTOMY  12/2006  . LAPAROSCOPIC GASTRIC SLEEVE RESECTION    . LAPAROSCOPY ABDOMEN DIAGNOSTIC  2008   "cut bile duct w/gallbladder OR; had to go in later & fix leak; hospitalized for 2 months"  . LEFT HEART CATHETERIZATION WITH CORONARY ANGIOGRAM N/A 02/26/2014   Procedure:  LEFT HEART CATHETERIZATION WITH CORONARY ANGIOGRAM;  Surgeon: Jettie Booze, MD;  Location: [facility];  Service: Cardiovascular;  Laterality: N/A;  . OVARIAN CYST REMOVAL Right 1999  . PVC ABLATION N/A 12/01/2017   Procedure: PVC ABLATION;  Surgeon: Evans Lance, MD;  Location: [facility];  Service: Cardiovascular;  Laterality: N/A;  . RIGHT HEART CATH N/A 08/05/2017   Procedure: RIGHT HEART CATH;  Surgeon: Jolaine Artist, MD;  Location: [facility];  Service: Cardiovascular;  Laterality: N/A;  . TUBAL LIGATION  1999   Social History   Socioeconomic History  . Marital status: Married    Spouse name: Not on file  . Number of children: 1  . Years of education: Not on file  . Highest education level: Not on file  Occupational History  . Occupation: stay at home mom  Social Needs  . Financial resource strain: Not on file  . Food insecurity:    Worry: Not on file    Inability: Not on file  . Transportation needs:    Medical: Not on file    Non-medical: Not on file  Tobacco Use  . Smoking status: Never Smoker  . Smokeless tobacco: Never Used  Substance and Sexual Activity  . Alcohol use: No  . Drug use: No  . Sexual activity: Yes  Lifestyle  . Physical activity:    Days per week: Not on file    Minutes per session: Not on file  . Stress: Not on file  Relationships  . Social connections:    Talks on phone: Not on file    Gets together: Not on file    Attends religious service: Not on file    Active member of club or organization: Not on file    Attends meetings of clubs or organizations: Not on file    Relationship status: Not on file  Other Topics Concern  . Not on file  Social History Narrative  . Not on file   Allergies  Allergen Reactions  . Aspirin Anaphylaxis and Other (See Comments)    Wheezing, "but patient still takes if she needs to"  . Vancomycin Other (See Comments)    "did something to my kidneys," PROGRESSED TO KIDNEY  FAILURE!!  . Contrast Media [Iodinated Diagnostic Agents] Other (See Comments)    Multiple CT contrast studies done over 2 weeks caused ARF  . Avocado Itching and Swelling  . Ciprofloxacin Itching and Rash  . Sulfa Antibiotics Itching and Rash   Family History  Problem Relation Age of Onset  . Emphysema Maternal Grandmother        smoked  . Heart disease Maternal Grandmother 55       MI  . Rheum arthritis Mother   . Allergies Daughter   . Colon cancer Neg Hx      Current Outpatient Medications (Cardiovascular):  .  amiodarone (PACERONE) 200 MG tablet, Take 1 tablet (200 mg total) by mouth daily. .  colestipol (COLESTID) 1 g tablet, Take 2  tablets (2 g total) by mouth 2 (two) times daily. .  ivabradine (CORLANOR) 7.5 MG TABS tablet, TAKE 1 TABLET (7.5 MG TOTAL) BY MOUTH 2 (TWO) TIMES DAILY WITH A MEAL. .  nitroGLYCERIN (NITROSTAT) 0.4 MG SL tablet, Place 1 tablet (0.4 mg total) under the tongue every 5 (five) minutes as needed for chest pain. .  sacubitril-valsartan (ENTRESTO) 97-103 MG, Take 1 tablet by mouth 2 (two) times daily. Marland Kitchen  spironolactone (ALDACTONE) 25 MG tablet, Take 1 tablet (25 mg total) by mouth daily. Marland Kitchen  torsemide (DEMADEX) 20 MG tablet, Take 2 tablets (40 mg total) by mouth daily.  Current Outpatient Medications (Respiratory):  .  albuterol (PROVENTIL HFA;VENTOLIN HFA) 108 (90 Base) MCG/ACT inhaler, Inhale 1-2 puffs into the lungs every 6 (six) hours as needed for wheezing or shortness of breath. .  fluticasone (FLONASE) 50 MCG/ACT nasal spray, Place 2 sprays into both nostrils daily as needed for allergies. .  promethazine-dextromethorphan (PROMETHAZINE-DM) 6.25-15 MG/5ML syrup, Take 5 mLs by mouth 4 (four) times daily as needed for cough. .  SYMBICORT 160-4.5 MCG/ACT inhaler, INHALE 2 PUFFS INTO THE LUNGS 2 (TWO) TIMES DAILY AS NEEDED.  Current Outpatient Medications (Analgesics):  Marland Kitchen  Ibuprofen-Famotidine 800-26.6 MG TABS, Take 1 tablet by mouth 3 (three) times  daily as needed.   Current Outpatient Medications (Other):  .  blood glucose meter kit and supplies, Dispense based on patient and insurance preference. Use up to two times daily as directed. (FOR ICD-10 E10.9, E11.9). Marland Kitchen  diclofenac sodium (VOLTAREN) 1 % GEL, Apply 4 g topically 4 (four) times daily. .  Diclofenac Sodium 2 % SOLN, Place 2 g onto the skin 2 (two) times daily. Marland Kitchen  dicyclomine (BENTYL) 20 MG tablet, Take 20 mg by mouth 2 (two) times daily as needed for spasms. Marland Kitchen  gabapentin (NEURONTIN) 100 MG capsule, Take 200 mg by mouth at bedtime as needed (for pain).  Marland Kitchen  KLOR-CON M20 20 MEQ tablet, TAKE 2 TABLETS (40 MEQ TOTAL) BY MOUTH DAILY. Marland Kitchen  lidocaine (LIDODERM) 5 %, Place 1 patch onto the skin daily. Remove & Discard patch within 12 hours or as directed by MD .  linaclotide (LINZESS) 145 MCG CAPS capsule, Take 1 capsule (145 mcg total) by mouth daily before breakfast. .  Multiple Vitamins-Minerals (MULTIVITAMIN GUMMIES WOMENS PO), Take 2 each by mouth daily. .  Vitamin D, Ergocalciferol, (DRISDOL) 1.25 MG (50000 UT) CAPS capsule, Take 1 capsule (50,000 Units total) by mouth every 7 (seven) days. Marland Kitchen  zolpidem (AMBIEN) 5 MG tablet, Take 1 tablet (5 mg total) by mouth at bedtime as needed for sleep. Marland Kitchen  omeprazole (PRILOSEC) 20 MG capsule, Take 20 mg by mouth daily.    Past medical history, social, surgical and family history all reviewed in electronic medical record.  No pertanent information unless stated regarding to the chief complaint.   Review of Systems:  No headache, visual changes, nausea, vomiting, diarrhea, constipation, dizziness, abdominal pain, skin rash, fevers, chills, night sweats, weight loss, swollen lymph nodes, body aches, joint swelling, muscle aches, chest pain, shortness of breath, mood changes.   Objective  Blood pressure 138/84, pulse 72, height 5' 5"  (1.651 m), weight 194 lb (88 kg), last menstrual period 09/24/2018, SpO2 99 %.    General: No apparent distress  alert and oriented x3 mood and affect normal, dressed appropriately.  HEENT: Pupils equal, extraocular movements intact  Respiratory: [name] speak in full sentences and does not appear short of breath  Cardiovascular: No lower extremity edema,  non tender, no erythema  Skin: Warm dry intact with no signs of infection or rash on extremities or on axial skeleton.  Abdomen: Soft nontender  Neuro: Cranial nerves II through XII are intact, neurovascularly intact in all extremities with 2+ DTRs and 2+ pulses.  Lymph: No lymphadenopathy of posterior or anterior cervical chain or axillae bilaterally.  Gait antalgic gait.  MSK:  Non tender with full range of motion and good stability and symmetric strength and tone of shoulders, elbows, wrist, hip, knee bilaterally.  Ankle: Left Ankle does have some swelling over the lateral malleolus.  Lipoma noted.  Significant stiffness noted.  Tender to palpation of the ATFL and the lateral malleolus.  Talar dome laterally tender as well.  Good stability of the ankle noted contralateral ankle unremarkable  MSK US performed of: Left ankle pain This study was ordered, performed, and interpreted by Charlann Boxer D.O.  Foot/Ankle:   Left ankle shows the patient has a healing avulsion fracture noted.  ATFL appears to be intact.  Trace swelling noted of the effusion.  IMPRESSION: Nicely healed avulsion fracture.  Trace effusion of the ankle noted  [id number]; 15 additional minutes spent for Therapeutic exercises as stated in above notes.  This included exercises focusing on stretching, strengthening, with significant focus on eccentric aspects.   Long term goals include an improvement in range of motion, strength, endurance as well as avoiding reinjury. [name] frequency would include in 1-2 times a day, 3-5 times a week for a duration of 6-12 weeks.  Ankle strengthening that included:  Basic range of motion exercises to allow proper full motion at ankle Stretching of the  lower leg and hamstrings  Theraband exercises for the lower leg - inversion, eversion, dorsiflexion and plantarflexion each to be completed with a theraband Balance exercises to increase proprioception Weight bearing exercises to increase strength and balance Proper technique shown and discussed handout in great detail with ATC.  All questions were discussed and answered.      Impression and Recommendations:     This case required medical decision making of moderate complexity. The above documentation has been reviewed and is accurate and complete Lyndal Pulley, DO       Note: This dictation was prepared with Dragon dictation along with smaller phrase technology. Any transcriptional errors that result from this process are unintentional.

## 2018-10-10 NOTE — Patient Outreach (Signed)
[facility]) Care Management  [place]  10/10/2018  [name] 1976-03-17 [id number]   Reason for referral: Medication Review, information on compliance packaging  Referral source: [employer] C-SNP Care Manager with [facility] Current insurance: [employer] C-SNP  Outreach:  Unsuccessful telephone call attempt #1 to patient.   HIPAA compliant voicemail left requesting a return call  Plan:  -Noted that another [facility] discipline has recently mailed patient an unsuccessful outreach letter.  -I will make another outreach attempt to patient within 3-4 business days.    Ralene Bathe, PharmD, [place] [phone]

## 2018-10-10 NOTE — Assessment & Plan Note (Signed)
Left ankle pain.  Patient did have x-rays today.  Independently visualized by me showing the patient did have a what appears his reabsorption of the lateral malleolus but otherwise fairly unremarkable.  Patient on ultrasound showed that there was a callus formation in the area.  Ligaments appear to be intact on ultrasound the patient does have an effusion of the joint.  Patient does not seem tender notes for a Tylenol dome fracture.  Encourage patient to go to a shoe and given an Aircast.  Home exercises given.  Follow-up again in 3 weeks

## 2018-10-11 ENCOUNTER — Ambulatory Visit: Payer: HMO | Admitting: Family Medicine

## 2018-10-11 ENCOUNTER — Ambulatory Visit: Payer: HMO | Admitting: *Deleted

## 2018-10-11 NOTE — Telephone Encounter (Signed)
Pt had appt w/our office on 5/4 and this was not mentioned or addressed and it was stated pt was still on Entresto and for her to continue it.  I called pt to f/u, she states after the lip swelling she stopped Entresto for a couple of days and then restarted, she states she had started drinking a new tea and feels that is what caused the allergic reaction, she has stopped drinking the tea and has been back on Entresto with no swelling or issues.  She will continue Entresto and let us know if she has any further issues.

## 2018-10-12 ENCOUNTER — Encounter: Payer: Self-pay | Admitting: Family Medicine

## 2018-10-12 ENCOUNTER — Other Ambulatory Visit: Payer: Self-pay

## 2018-10-12 NOTE — Patient Outreach (Signed)
  [place] [facility]) Care Management Chronic Special Needs Program   10/12/2018  Name: MARJORIA MANCILLAS, DOB: Apr 27, 1976  MRN: [id number]  The client was discussed in today's interdisciplinary care team meeting.  The following issues were discussed:  Client's needs, Key risk triggers/risk stratification, Care Plan, Coordination of care and Issues/barriers to care  Participants present:  Mahlon Gammon, RNCM; Peter Garter, RNCM; Thea Silversmith, RNCM; Dr. Marco Collie; Dr. Diannia Ruder; [place], Gilda Crease, Pharm D RPh.  Recommendations/Plan:  Care coordination with provider and heart failure team; bone density test if client has not had one   .    Thea Silversmith, RN, MSN, [place] [phone]

## 2018-10-13 ENCOUNTER — Ambulatory Visit: Payer: Self-pay | Admitting: Pharmacist

## 2018-10-13 ENCOUNTER — Other Ambulatory Visit: Payer: Self-pay | Admitting: Pharmacist

## 2018-10-13 NOTE — Patient Outreach (Signed)
[place] [facility]) Care Management  [place]  10/13/2018  [name] [birth year] [id number]   Reason for referral: Medication Review, information on compliance packagaing  Referral source: [employer] C-SNP Care Manager with [facility] Current insurance: [employer] C-SNP  Outreach:  Unsuccessful telephone call attempt #2 to patient.   HIPAA compliant voicemail left requesting a return call  Plan:  -I will make another outreach attempt to patient within 3-4 business days.    Ralene Bathe, PharmD, [place] [phone]

## 2018-10-14 ENCOUNTER — Other Ambulatory Visit: Payer: Self-pay | Admitting: *Deleted

## 2018-10-14 NOTE — Patient Outreach (Signed)
[place] [facility]) Care Management  10/14/2018  [name] 1976-04-07 [id number]    CSW attempted to reach pt for assigned CSW, [name], [place]. CSW was unable to reach pt and left a HIPPA compliant voice message.  CSW will advise assigned CSW for a 3rd outreach phonecall attempt within the 3 calls in 10 days policy.  Eduard Clos, MSW, [place] Worker  [place] [phone]

## 2018-10-18 ENCOUNTER — Telehealth: Payer: Self-pay

## 2018-10-18 ENCOUNTER — Ambulatory Visit (INDEPENDENT_AMBULATORY_CARE_PROVIDER_SITE_OTHER): Payer: HMO

## 2018-10-18 DIAGNOSIS — I5032 Chronic diastolic (congestive) heart failure: Secondary | ICD-10-CM

## 2018-10-18 DIAGNOSIS — Z95810 Presence of automatic (implantable) cardiac defibrillator: Secondary | ICD-10-CM

## 2018-10-18 NOTE — Telephone Encounter (Signed)
Remote ICM transmission received.  Attempted call to patient regarding ICM remote transmission and left message to return call   

## 2018-10-18 NOTE — Progress Notes (Signed)
EPIC Encounter for ICM Monitoring  Patient Name: [name] is a 43 y.o. female Date: 10/18/2018 Primary Care Physican: Hoyt Koch, MD Primary Cardiologist: Smith/Bensimhon Electrophysiologist: Caryl Comes LastWeight:195.6lbs 3/18/2020Weight:192 lbs 10/03/2018 Weight:Unknown   Attempted call to patient and unable to reach.  Left detailed message per DPR regarding transmission. Transmission reviewed.   HeartLogic Heart Failure Indexalert today for reading of 28.  Index started rising 5/11.   Prescribed:Torsemide20 mgTake 2 tablets (40 mg total) by mouth daily. Can take extra Torsemide as needed and Dr Haroldine Laws has discussed sliding scale.  Labs: 07/04/2018 Creatinine0.98, BUN10, Potassium3.4, Sodium141, GFR>60 06/06/2018 Creatinine0.93, BUN7, Potassium 3.7, Sodium138, GFR>60  05/27/2018 Creatinine1.02, BUN8, Potassium 3.5, Sodium137, GFR>60 05/19/2018 Creatinine 0.76, BUN >5, Potassium 3.7, Sodium 139, GFR >60 01/14/2018 Creatinine 0.90, BUN 12, Potassium 3.7, Sodium 139, GFR 87.56 11/24/2017 Creatinine1.02, BUN12, Potassium3.9, Sodium137, GFR>60 11/23/2017 Creatinine1.08, BUN11, Potassium3.5, Sodium138, GFR>60  11/22/2017 Creatinine0.84, BUN8, Potassium 3.3, Sodium140, GFR>60  A complete set of results can be found in Results Review  Recommendations: Left voice mail with ICM number and encouraged to call if experiencing any fluid symptoms.  Follow-up plan: ICM clinic phone appointment on 10/24/2018 for 31 day and recheck fluid levels.  Office appointment scheduled 11/01/2018 with Dr. Haroldine Laws.        Copy of ICM check sent to Dr. Caryl Comes and Dr Haroldine Laws.    Ali Chukson, RN 10/18/2018 12:52 PM

## 2018-10-19 ENCOUNTER — Other Ambulatory Visit: Payer: Self-pay | Admitting: *Deleted

## 2018-10-19 NOTE — Patient Outreach (Signed)
[facility]) Care Management  10/19/2018  [name] 11-Dec-1975 [id number]    CSW made a third and final attempt to try and contact patient today to perform phone assessment, as well as assess and assist with social work needs and services, without success.  A HIPAA compliant message was left for patient on voicemail.  CSW is currently awaiting a return call.  CSW will proceed with case closure in two business days, if a return call is not received in the meantime, as required number of phone attempts have been made and an outreach letter was mailed to patient's home allowing 10 business days for a response.  [name], BSW, MSW, LCSW  Licensed Education officer, environmental Health System  Mailing [place] [address], [place], [place] [address] Physical Address-[address], [place],  [address] Toll Free Main # [phone] Fax # [phone] Cell # [phone]  Office # [phone] [name]

## 2018-10-20 ENCOUNTER — Ambulatory Visit: Payer: Self-pay | Admitting: Pharmacist

## 2018-10-20 ENCOUNTER — Other Ambulatory Visit: Payer: Self-pay | Admitting: Pharmacist

## 2018-10-20 NOTE — Patient Outreach (Signed)
[place] [facility]) Care Management  [place]   10/20/2018  [name] Nov 26, 1975 [id number]  Reason for referral: Medication Review, information on compliance packaging  Referral source: [employer] C-SNP Care Manager with [facility] Current insurance: [employer] C-SNP  Outreach:  Unsuccessful telephone call attempt #3 to patient. HIPAA compliant voicemail left requesting a return call  Plan:  -I will close [place] case at this time as I have been unable to establish and/or maintain contact with patient.  -I am happy to assist in the future as needed.    Ralene Bathe, PharmD, [place] [phone]

## 2018-10-24 ENCOUNTER — Ambulatory Visit (INDEPENDENT_AMBULATORY_CARE_PROVIDER_SITE_OTHER): Payer: HMO

## 2018-10-24 ENCOUNTER — Encounter: Payer: Self-pay | Admitting: *Deleted

## 2018-10-24 ENCOUNTER — Other Ambulatory Visit: Payer: Self-pay | Admitting: *Deleted

## 2018-10-24 ENCOUNTER — Telehealth: Payer: Self-pay

## 2018-10-24 DIAGNOSIS — I5032 Chronic diastolic (congestive) heart failure: Secondary | ICD-10-CM | POA: Diagnosis not present

## 2018-10-24 DIAGNOSIS — Z95810 Presence of automatic (implantable) cardiac defibrillator: Secondary | ICD-10-CM | POA: Diagnosis not present

## 2018-10-24 NOTE — Telephone Encounter (Signed)
Remote ICM transmission received.  Attempted call to patient regarding ICM remote transmission and left detailed message, per DPR, to return call with next ICM remote transmission date of 10/31/2018.

## 2018-10-24 NOTE — Patient Outreach (Signed)
[place] [facility]) Care Management  10/24/2018  [name] 01-29-1976 [id number]   CSW will perform a case closure on patient, due to inability to establish initial phone contact, despite required number of phone attempts made and outreach letter mailed to patient's home, allowing 10 business days for a response.  CSW will notify Thea Silversmith, C-SNP Chronic Care Management Coordinator, also with [place] [facility], of CSW's plans to close patient's case.  CSW will fax an update to patient's Primary Care Physician, Dr. Pricilla Holm to ensure that they are aware of CSW's involvement with patient's plan of care.    [name], BSW, MSW, LCSW  Licensed Education officer, environmental Health System  Mailing [place] [address] [place], [place] [address] Physical Address-[address], [place], [place] [address] Toll Free Main # [phone] Fax # [phone] Cell # [phone]  Office # [phone] [name]

## 2018-10-24 NOTE — Progress Notes (Signed)
EPIC Encounter for ICM Monitoring  Patient Name: [name] is a 43 y.o. female Date: 10/24/2018 Primary Care Physican: Hoyt Koch, MD Primary Cardiologist: Smith/Bensimhon Electrophysiologist: Caryl Comes LastWeight:195.6lbs 3/18/2020Weight:192 lbs 10/03/2018 Weight:Unknown   Attempted call to patient and unable to reach. Left detailed message per DPR regarding transmission. Transmission reviewed.   HeartLogic Heart Failure Indexdecreased from 28 to 21.  Prescribed:Torsemide20 mgTake 2 tablets (40 mg total) by mouth daily. Can take extra Torsemide as needed and Dr Haroldine Laws has discussed sliding scale.  Labs: 07/04/2018 Creatinine0.98, BUN10, Potassium3.4, Sodium141, GFR>60 06/06/2018 Creatinine0.93, BUN7, Potassium 3.7, Sodium138, GFR>60  05/27/2018 Creatinine1.02, BUN8, Potassium 3.5, Sodium137, GFR>60 05/19/2018 Creatinine 0.76, BUN >5, Potassium 3.7, Sodium 139, GFR >60 01/14/2018 Creatinine 0.90, BUN 12, Potassium 3.7, Sodium 139, GFR 87.56 11/24/2017 Creatinine1.02, BUN12, Potassium3.9, Sodium137, GFR>60 11/23/2017 Creatinine1.08, BUN11, Potassium3.5, Sodium138, GFR>60  11/22/2017 Creatinine0.84, BUN8, Potassium 3.3, Sodium140, GFR>60  A complete set of results can be found in Results Review  Recommendations:Left voice mail with ICM number and encouraged to call if experiencing any fluid symptoms.  Follow-up plan:  ICM clinic phone appointment on6/12/2018 recheck fluid levels. Office appointment scheduled 11/01/2018 with Dr.Bensimhon.        Copy of ICM check sent to Dr. Caryl Comes and Dr Haroldine Laws.    [place], RN 10/24/2018 12:54 PM

## 2018-10-25 ENCOUNTER — Encounter: Payer: Self-pay | Admitting: Internal Medicine

## 2018-10-26 ENCOUNTER — Ambulatory Visit (INDEPENDENT_AMBULATORY_CARE_PROVIDER_SITE_OTHER): Payer: HMO | Admitting: Internal Medicine

## 2018-10-26 ENCOUNTER — Encounter: Payer: Self-pay | Admitting: Internal Medicine

## 2018-10-26 DIAGNOSIS — G4452 New daily persistent headache (NDPH): Secondary | ICD-10-CM

## 2018-10-26 DIAGNOSIS — R519 Headache, unspecified: Secondary | ICD-10-CM | POA: Insufficient documentation

## 2018-10-26 MED ORDER — BUTALBITAL-APAP-CAFFEINE 50-325-40 MG PO TABS: 1.0000 | ORAL_TABLET | Freq: Four times a day (QID) | ORAL | 0 refills | Status: DC | PRN
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
## 2018-10-26 NOTE — Assessment & Plan Note (Signed)
Could have been associated with the hydration. Rx for fiorcet as otc is not helping. Would avoid triptans with cardiac history.

## 2018-10-28 ENCOUNTER — Other Ambulatory Visit: Payer: Self-pay

## 2018-10-28 ENCOUNTER — Ambulatory Visit: Payer: Self-pay

## 2018-10-28 ENCOUNTER — Encounter (HOSPITAL_COMMUNITY): Payer: Self-pay

## 2018-10-28 ENCOUNTER — Encounter: Payer: Self-pay | Admitting: Family Medicine

## 2018-10-28 ENCOUNTER — Ambulatory Visit (INDEPENDENT_AMBULATORY_CARE_PROVIDER_SITE_OTHER): Payer: HMO | Admitting: Family Medicine

## 2018-10-28 VITALS — BP 110/82 | HR 45 | Ht 65.0 in | Wt 194.0 lb

## 2018-10-28 DIAGNOSIS — G8929 Other chronic pain: Secondary | ICD-10-CM

## 2018-10-28 DIAGNOSIS — M25572 Pain in left ankle and joints of left foot: Secondary | ICD-10-CM

## 2018-10-28 DIAGNOSIS — S8262XG Displaced fracture of lateral malleolus of left fibula, subsequent encounter for closed fracture with delayed healing: Secondary | ICD-10-CM | POA: Diagnosis not present

## 2018-10-28 NOTE — Progress Notes (Signed)
[name] [address] [place], [place] [address] Phone: [phone] Subjective:   I [name] am serving as a Education administrator for Dr. Hulan Saas.   CC: ankle pain follow up   JOI:[id number]  [name] is a 43 y.o. female coming in with complaint of ankle pain. States the ankle is still painful. Has been exercising on a bike as well as exercises. Dorsiflexion is painful in the joint. Walking is fine.  Patient feels like she is making progress.  Patient was found to have actually a lateral malleolus fracture.  Does seem to be getting better.      Past Medical History:  Diagnosis Date  . Acute on chronic systolic (congestive) heart failure ([place]) 04/29/2017  . Acute pain of right shoulder 06/16/2017  . AICD (automatic cardioverter/defibrillator) present   . Anxiety   . Arthritis    right shoulder   . Asthma   . CHF (congestive heart failure) ([place])   . Chronic combined systolic and diastolic heart failure ([place]) 03/05/2014  . Closed low lateral malleolus fracture 10/23/2013  . Cystitis 10/21/2017  . Depression   . Depression with anxiety 01/20/2013  . Diabetes mellitus without complication ([place])   . Diverticulosis   . Dyspnea    comes and goes intermittently mostly with exertion   . Essential hypertension    Prev followed by [facility]/ Cardiology   . Fibroid    age 78  . Gallstones   . Generalized abdominal cramping   . History of cardiomyopathy   . Hypertension   . IBS (irritable bowel syndrome)   . 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 Insomnia 05/07/2017  . Labral tear of shoulder 04/04/2015    Injected 04/04/2015 Injected 12/03/2015   . Migraine    "monthly" (08/03/2016)  . Myofascial pain 06/16/2017  . NICM (nonischemic cardiomyopathy) ([place]) 08/03/2016  . Nonallopathic lesion of lumbosacral region 11/16/2016  . Nonallopathic lesion of sacral region 11/16/2016  . Nonallopathic lesion of thoracic region  08/20/2014  . Nonspecific chest pain 04/28/2017  . OSA (obstructive sleep apnea) 01/02/2013   NPSG 2009:  AHI 9/hr. CPAP intolerance >> "smothering" Good tolerance of auto device (optimal pressure 12-13 on download).  - referred to Dr Gwenette Greet    . OSA on CPAP   . Ovarian cyst    1999; surgically removed  . Patellofemoral syndrome of both knees 10/16/2016  . 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 PVC (premature ventricular contraction) 06/23/2016  . Seizures ([place])    "as a child" (08/03/2016)  . Termination of pregnancy    due to cardiac risk   Past Surgical History:  Procedure Laterality Date  . CARDIAC CATHETERIZATION N/A 11/11/2015   Procedure: Right/Left Heart Cath and Coronary Angiography;  Surgeon: Troy Sine, MD;  Location: [place] [facility];  Service: Cardiovascular;  Laterality: N/A;  . CARDIAC CATHETERIZATION  ~ 2015  . CARDIAC DEFIBRILLATOR PLACEMENT  08/03/2016  . CESAREAN SECTION  1999  . COLONOSCOPY WITH PROPOFOL N/A 04/21/2016   Procedure: COLONOSCOPY WITH PROPOFOL;  Surgeon: Jerene Bears, MD;  Location: [facility];  Service: Gastroenterology;  Laterality: N/A;  . ESOPHAGOGASTRODUODENOSCOPY (EGD) WITH PROPOFOL N/A 04/21/2016   Procedure: ESOPHAGOGASTRODUODENOSCOPY (EGD) WITH PROPOFOL;  Surgeon: Jerene Bears, MD;  Location: [facility];  Service: Gastroenterology;  Laterality: N/A;  . FOOT FRACTURE SURGERY Right ~ 2003  . FRACTURE SURGERY    . ICD IMPLANT N/A 08/03/2016  Procedure: ICD Implant;  Surgeon: Deboraha Sprang, MD;  Location: [place] [facility];  Service: Cardiovascular;  Laterality: N/A;  . LAPAROSCOPIC CHOLECYSTECTOMY  12/2006  . LAPAROSCOPIC GASTRIC SLEEVE RESECTION    . LAPAROSCOPY ABDOMEN DIAGNOSTIC  2008   "cut bile duct w/gallbladder OR; had to go in later & fix leak; hospitalized for 2 months"  . LEFT HEART CATHETERIZATION WITH CORONARY ANGIOGRAM N/A 02/26/2014   Procedure: LEFT HEART CATHETERIZATION WITH CORONARY ANGIOGRAM;   Surgeon: Jettie Booze, MD;  Location: [facility];  Service: Cardiovascular;  Laterality: N/A;  . OVARIAN CYST REMOVAL Right 1999  . PVC ABLATION N/A 12/01/2017   Procedure: PVC ABLATION;  Surgeon: Evans Lance, MD;  Location: [place] [facility];  Service: Cardiovascular;  Laterality: N/A;  . RIGHT HEART CATH N/A 08/05/2017   Procedure: RIGHT HEART CATH;  Surgeon: Jolaine Artist, MD;  Location: [place] [facility];  Service: Cardiovascular;  Laterality: N/A;  . TUBAL LIGATION  1999   Social History   Socioeconomic History  . Marital status: Married    Spouse name: Not on file  . Number of children: 1  . Years of education: Not on file  . Highest education level: Not on file  Occupational History  . Occupation: stay at home mom  Social Needs  . Financial resource strain: Not on file  . Food insecurity:    Worry: Not on file    Inability: Not on file  . Transportation needs:    Medical: Not on file    Non-medical: Not on file  Tobacco Use  . Smoking status: Never Smoker  . Smokeless tobacco: Never Used  Substance and Sexual Activity  . Alcohol use: No  . Drug use: No  . Sexual activity: Yes  Lifestyle  . Physical activity:    Days per week: Not on file    Minutes per session: Not on file  . Stress: Not on file  Relationships  . Social connections:    Talks on phone: Not on file    Gets together: Not on file    Attends religious service: Not on file    Active member of club or organization: Not on file    Attends meetings of clubs or organizations: Not on file    Relationship status: Not on file  Other Topics Concern  . Not on file  Social History Narrative  . Not on file   Allergies  Allergen Reactions  . Aspirin Anaphylaxis and Other (See Comments)    Wheezing, "but patient still takes if she needs to"  . Vancomycin Other (See Comments)    "did something to my kidneys," PROGRESSED TO KIDNEY FAILURE!!  . Contrast Media [Iodinated Diagnostic Agents]  Other (See Comments)    Multiple CT contrast studies done over 2 weeks caused ARF  . Avocado Itching and Swelling  . Ciprofloxacin Itching and Rash  . Sulfa Antibiotics Itching and Rash   Family History  Problem Relation Age of Onset  . Emphysema Maternal Grandmother        smoked  . Heart disease Maternal Grandmother 51       MI  . Rheum arthritis Mother   . Allergies Daughter   . Colon cancer Neg Hx      Current Outpatient Medications (Cardiovascular):  .  amiodarone (PACERONE) 200 MG tablet, Take 1 tablet (200 mg total) by mouth daily. .  colestipol (COLESTID) 1 g tablet, Take 2 tablets (2 g total) by mouth  2 (two) times daily. .  ivabradine (CORLANOR) 7.5 MG TABS tablet, TAKE 1 TABLET (7.5 MG TOTAL) BY MOUTH 2 (TWO) TIMES DAILY WITH A MEAL. .  nitroGLYCERIN (NITROSTAT) 0.4 MG SL tablet, Place 1 tablet (0.4 mg total) under the tongue every 5 (five) minutes as needed for chest pain. .  sacubitril-valsartan (ENTRESTO) 97-103 MG, Take 1 tablet by mouth 2 (two) times daily. Marland Kitchen  spironolactone (ALDACTONE) 25 MG tablet, Take 1 tablet (25 mg total) by mouth daily. Marland Kitchen  torsemide (DEMADEX) 20 MG tablet, Take 2 tablets (40 mg total) by mouth daily.  Current Outpatient Medications (Respiratory):  .  albuterol (PROVENTIL HFA;VENTOLIN HFA) 108 (90 Base) MCG/ACT inhaler, Inhale 1-2 puffs into the lungs every 6 (six) hours as needed for wheezing or shortness of breath. .  fluticasone (FLONASE) 50 MCG/ACT nasal spray, Place 2 sprays into both nostrils daily as needed for allergies. .  promethazine-dextromethorphan (PROMETHAZINE-DM) 6.25-15 MG/5ML syrup, Take 5 mLs by mouth 4 (four) times daily as needed for cough. .  SYMBICORT 160-4.5 MCG/ACT inhaler, INHALE 2 PUFFS INTO THE LUNGS 2 (TWO) TIMES DAILY AS NEEDED.  Current Outpatient Medications (Analgesics):  .  butalbital-acetaminophen-caffeine (FIORICET) 50-325-40 MG tablet, Take 1-2 tablets by mouth every 6 (six) hours as needed for headache. .   Ibuprofen-Famotidine 800-26.6 MG TABS, Take 1 tablet by mouth 3 (three) times daily as needed.   Current Outpatient Medications (Other):  .  blood glucose meter kit and supplies, Dispense based on patient and insurance preference. Use up to two times daily as directed. (FOR ICD-10 E10.9, E11.9). Marland Kitchen  diclofenac sodium (VOLTAREN) 1 % GEL, Apply 4 g topically 4 (four) times daily. .  Diclofenac Sodium 2 % SOLN, Place 2 g onto the skin 2 (two) times daily. Marland Kitchen  dicyclomine (BENTYL) 20 MG tablet, Take 20 mg by mouth 2 (two) times daily as needed for spasms. Marland Kitchen  gabapentin (NEURONTIN) 100 MG capsule, Take 200 mg by mouth at bedtime as needed (for pain).  Marland Kitchen  KLOR-CON M20 20 MEQ tablet, TAKE 2 TABLETS (40 MEQ TOTAL) BY MOUTH DAILY. Marland Kitchen  lidocaine (LIDODERM) 5 %, Place 1 patch onto the skin daily. Remove & Discard patch within 12 hours or as directed by MD .  linaclotide (LINZESS) 145 MCG CAPS capsule, Take 1 capsule (145 mcg total) by mouth daily before breakfast. .  Multiple Vitamins-Minerals (MULTIVITAMIN GUMMIES WOMENS PO), Take 2 each by mouth daily. .  Vitamin D, Ergocalciferol, (DRISDOL) 1.25 MG (50000 UT) CAPS capsule, Take 1 capsule (50,000 Units total) by mouth every 7 (seven) days. Marland Kitchen  zolpidem (AMBIEN) 5 MG tablet, Take 1 tablet (5 mg total) by mouth at bedtime as needed for sleep. Marland Kitchen  omeprazole (PRILOSEC) 20 MG capsule, Take 20 mg by mouth daily.    Past medical history, social, surgical and family history all reviewed in electronic medical record.  No pertanent information unless stated regarding to the chief complaint.   Review of Systems:  No headache, visual changes, nausea, vomiting, diarrhea, constipation, dizziness, abdominal pain, skin rash, fevers, chills, night sweats, weight loss, swollen lymph nodes, body aches, joint swelling, muscle aches, chest pain, shortness of breath, mood changes.   Objective  Blood pressure 110/82, pulse (!) 45, height '5\' 5"'$  (1.651 m), weight 194 lb (88 kg),  SpO2 98 %.    General: No apparent distress alert and oriented x3 mood and affect normal, dressed appropriately.  HEENT: Pupils equal, extraocular movements intact  Respiratory: [name] speak in full sentences and does  not appear short of breath  Cardiovascular: No lower extremity edema, non tender, no erythema  Skin: Warm dry intact with no signs of infection or rash on extremities or on axial skeleton.  Abdomen: Soft nontender  Neuro: Cranial nerves II through XII are intact, neurovascularly intact in all extremities with 2+ DTRs and 2+ pulses.  Lymph: No lymphadenopathy of posterior or anterior cervical chain or axillae bilaterally.  Gait normal with good balance and coordination.  MSK:  Non tender with full range of motion and good stability and symmetric strength and tone of shoulders, elbows, wrist, hip, knee bilaterally.  Ankle: Left ankle Trace swelling over the lateral malleolus Range of motion is full in all directions. Strength is 5/5 in all directions. Stable lateral and medial ligaments; squeeze test and kleiger test unremarkable; Talar dome mildly tender No pain at base of 5th MT; No tenderness over cuboid; No tenderness over N spot or navicular prominence No tenderness on posterior aspects of lateral and medial malleolus No sign of peroneal tendon subluxations or tenderness to palpation Negative tarsal tunnel tinel's Able to walk 4 steps.  MSK US performed of:  This study was ordered, performed, and interpreted by Charlann Boxer D.O.  Foot/Ankle:   All structures visualized.   Talar dome unremarkable  Ankle mortise without effusion. Peroneus longus and brevis tendons unremarkable on long and transverse views without sheath effusions. Posterior tibialis, flexor hallucis longus, and flexor digitorum longus tendons unremarkable on long and transverse views without sheath effusions. Achilles tendon visualized along length of tendon and unremarkable on long and transverse  views without sheath effusion. Anterior Talofibular Ligament and Calcaneofibular Ligaments unremarkable and intact. Deltoid Ligament unremarkable and intact. Plantar fascia intact and without effusion, normal thickness.  IMPRESSION: Lateral malleolus fracture    Impression and Recommendations:     This case required medical decision making of moderate complexity. The above documentation has been reviewed and is accurate and complete Lyndal Pulley, DO       Note: This dictation was prepared with Dragon dictation along with smaller phrase technology. Any transcriptional errors that result from this process are unintentional.

## 2018-10-28 NOTE — Assessment & Plan Note (Signed)
Patient is healing fairly well with a lateral malleolus fracture.  This seems to be an acute on chronic.  Increase activity at this point outside Airc[employer].  Patient still has some mild inflammation over the talar dome that we may need to monitor.  I believe though that patient is walking significantly better and will do well with conservative therapy.

## 2018-10-28 NOTE — Patient Instructions (Signed)
Good to see you.  Ice 20 minutes 2 times daily. Usually after activity and before bed. Exercises 3 times a week.  No brace in house 4 weeks with brace while exercising See me again in 3 weeks

## 2018-10-31 ENCOUNTER — Ambulatory Visit (INDEPENDENT_AMBULATORY_CARE_PROVIDER_SITE_OTHER): Payer: HMO

## 2018-10-31 DIAGNOSIS — Z95810 Presence of automatic (implantable) cardiac defibrillator: Secondary | ICD-10-CM

## 2018-10-31 DIAGNOSIS — I5032 Chronic diastolic (congestive) heart failure: Secondary | ICD-10-CM

## 2018-11-01 ENCOUNTER — Ambulatory Visit (HOSPITAL_COMMUNITY)
Admission: RE | Admit: 2018-11-01 | Discharge: 2018-11-01 | Disposition: A | Discharge status: Home / Self Care | Payer: HMO | Source: Ambulatory Visit | Attending: Cardiology | Admitting: Cardiology

## 2018-11-01 ENCOUNTER — Encounter: Payer: Self-pay | Admitting: Internal Medicine

## 2018-11-01 ENCOUNTER — Encounter (HOSPITAL_COMMUNITY): Payer: Self-pay

## 2018-11-01 ENCOUNTER — Other Ambulatory Visit: Payer: Self-pay

## 2018-11-01 DIAGNOSIS — I5042 Chronic combined systolic (congestive) and diastolic (congestive) heart failure: Secondary | ICD-10-CM

## 2018-11-01 DIAGNOSIS — I110 Hypertensive heart disease with heart failure: Secondary | ICD-10-CM | POA: Insufficient documentation

## 2018-11-01 DIAGNOSIS — I428 Other cardiomyopathies: Secondary | ICD-10-CM | POA: Diagnosis not present

## 2018-11-01 DIAGNOSIS — E119 Type 2 diabetes mellitus without complications: Secondary | ICD-10-CM | POA: Insufficient documentation

## 2018-11-01 DIAGNOSIS — R300 Dysuria: Secondary | ICD-10-CM

## 2018-11-01 NOTE — Progress Notes (Signed)
  Echocardiogram 2D Echocardiogram has been performed.  Demarco Bacci L Androw 11/01/2018, 1:29 PM

## 2018-11-02 ENCOUNTER — Telehealth (HOSPITAL_COMMUNITY): Payer: Self-pay

## 2018-11-02 NOTE — Progress Notes (Signed)
EPIC Encounter for ICM Monitoring  Patient Name: [name] is a 43 y.o. female Date: 11/02/2018 Primary Care Physican: Hoyt Koch, MD Primary Cardiologist: [facility] Electrophysiologist: Caryl Comes LastWeight:203lbs    Transmission reviewed and results sent via mychart.   10/30/2018 HeartLogic Heart Failure Index is 14and continues toward normal.  Prescribed:Torsemide20 mgTake 2 tablets (40 mg total) by mouth daily. Can take extra Torsemide as needed and Dr Haroldine Laws has discussed sliding scale.  Labs: 07/04/2018 Creatinine0.98, BUN10, Potassium3.4, Sodium141, GFR>60 06/06/2018 Creatinine0.93, BUN7, Potassium 3.7, Sodium138, GFR>60  05/27/2018 Creatinine1.02, BUN8, Potassium 3.5, Sodium137, GFR>60 05/19/2018 Creatinine 0.76, BUN >5, Potassium 3.7, Sodium 139, GFR >60 01/14/2018 Creatinine 0.90, BUN 12, Potassium 3.7, Sodium 139, GFR 87.56 11/24/2017 Creatinine1.02, BUN12, Potassium3.9, Sodium137, GFR>60 11/23/2017 Creatinine1.08, BUN11, Potassium3.5, Sodium138, GFR>60  11/22/2017 Creatinine0.84, BUN8, Potassium 3.3, Sodium140, GFR>60  A complete set of results can be found in Results Review  Recommendations:None  Follow-up plan:  ICM clinic phone appointment on7/13/2020.  Copy of ICM check sent to Dr. Caryl Comes.      [name], RN 11/02/2018 9:26 AM

## 2018-11-02 NOTE — Telephone Encounter (Signed)
-----   Message from Conrad [place], NP sent at 11/02/2018  9:29 AM EDT ----- EF down 25-30% . RV mildly elevated. Needs f/u with Dr Haroldine Laws in 3-4 weeks.

## 2018-11-02 NOTE — Telephone Encounter (Signed)
Called and left a voicemail for pt to call back to receive her echo results.

## 2018-11-05 ENCOUNTER — Encounter (HOSPITAL_COMMUNITY): Payer: Self-pay

## 2018-11-09 ENCOUNTER — Telehealth (HOSPITAL_COMMUNITY): Payer: Self-pay

## 2018-11-09 NOTE — Telephone Encounter (Signed)
Returned call from VM on triage line stating she had 'issues' she'd like to discuss, no answer so left VM with office number to call back and that we also had ECHO results and needed to reschedule an appt with her.

## 2018-11-18 ENCOUNTER — Other Ambulatory Visit: Payer: Self-pay

## 2018-11-18 ENCOUNTER — Ambulatory Visit: Payer: HMO | Admitting: Family Medicine

## 2018-11-18 NOTE — Patient Outreach (Signed)
  [facility]) Care Management Chronic Special Needs Program    11/18/2018  Name: [name], DOB: 18-Aug-1975  MRN: [id number]   [name] is enrolled in a chronic special needs plan for Heart Failure. RNCM called to follow up. No answer. HIPAA compliant message left.  Plan: Await return call. Continue to follow. Next scheduled telephonic outreach previously scheduled for August.  Thea Silversmith, RN, MSN, [place] [phone]

## 2018-11-21 ENCOUNTER — Ambulatory Visit (INDEPENDENT_AMBULATORY_CARE_PROVIDER_SITE_OTHER): Payer: HMO

## 2018-11-21 DIAGNOSIS — I5032 Chronic diastolic (congestive) heart failure: Secondary | ICD-10-CM

## 2018-11-21 DIAGNOSIS — Z95810 Presence of automatic (implantable) cardiac defibrillator: Secondary | ICD-10-CM

## 2018-11-21 NOTE — Progress Notes (Signed)
EPIC Encounter for ICM Monitoring  Patient Name: [name] is a 43 y.o. female Date: 11/21/2018 Primary Care Physican: Hoyt Koch, MD Primary Cardiologist: [facility]/Bensimhon Electrophysiologist: Caryl Comes 6/29/2020Weight:202lbs   Spoke with patient.  She said she was not feeling well a few days ago and took extra Torsemide.  She feels better today.  She thought she had fluid over the weekend but assume since she felt better today it had resolved.   11/21/2018 HeartLogic Heart Failure Index alert due to index has increased to 41 suggesting possible fluid accumulation.  Prescribed:Torsemide20 mgTake 2 tablets (40 mg total) by mouth daily. Can take extra Torsemide as needed and Dr Haroldine Laws has discussed sliding scale.  Labs: 07/04/2018 Creatinine0.98, BUN10, Potassium3.4, Sodium141, GFR>60 06/06/2018 Creatinine0.93, BUN7, Potassium 3.7, Sodium138, GFR>60  05/27/2018 Creatinine1.02, BUN8, Potassium 3.5, Sodium137, GFR>60 05/19/2018 Creatinine 0.76, BUN >5, Potassium 3.7, Sodium 139, GFR >60 01/14/2018 Creatinine 0.90, BUN 12, Potassium 3.7, Sodium 139, GFR 87.56 11/24/2017 Creatinine1.02, BUN12, Potassium3.9, Sodium137, GFR>60 11/23/2017 Creatinine1.08, BUN11, Potassium3.5, Sodium138, GFR>60  11/22/2017 Creatinine0.84, BUN8, Potassium 3.3, Sodium140, GFR>60  A complete set of results can be found in Results Review  Recommendations: Advised patient to take extra Torsemide x 2 days and then return to prescribed dosage.  Patient would like an office visit with HF clinic.  She said at the 6/9 HF clinic appt she was not feeling well and left before she say NP/PA.  Advised I will have HF clinic contact her to reschedule an appointment.  Follow-up plan: ICM clinic phone appointment on7/11/2018 to recheck fluid levels.  Copy of ICM check sent to Dr. Caryl Comes and Dr Haroldine Laws.      [place], RN 11/21/2018 2:27 PM

## 2018-11-24 ENCOUNTER — Telehealth (HOSPITAL_COMMUNITY): Payer: Self-pay | Admitting: Adult Health

## 2018-11-24 NOTE — Telephone Encounter (Signed)
lvm for PT re:appt & COVID screening. ° °-GSM °

## 2018-11-28 ENCOUNTER — Encounter (HOSPITAL_COMMUNITY): Payer: Self-pay

## 2018-11-28 ENCOUNTER — Encounter (HOSPITAL_COMMUNITY): Payer: HMO

## 2018-11-29 ENCOUNTER — Ambulatory Visit (INDEPENDENT_AMBULATORY_CARE_PROVIDER_SITE_OTHER): Payer: HMO

## 2018-11-29 ENCOUNTER — Telehealth: Payer: Self-pay

## 2018-11-29 DIAGNOSIS — Z95810 Presence of automatic (implantable) cardiac defibrillator: Secondary | ICD-10-CM

## 2018-11-29 DIAGNOSIS — I5032 Chronic diastolic (congestive) heart failure: Secondary | ICD-10-CM

## 2018-11-29 NOTE — Telephone Encounter (Signed)
Remote ICM transmission received.  Attempted call to patient regarding ICM remote transmission and left detailed message, per DPR, with next ICM remote transmission date of 12/19/2018.  Advised to return call for any fluid symptoms or questions.    

## 2018-11-29 NOTE — Progress Notes (Signed)
EPIC Encounter for ICM Monitoring  Patient Name: KAYELYN LEMON is a 43 y.o. female Date: 11/29/2018 Primary Care Physican: Hoyt Koch, MD Primary Cardiologist: [facility]/B[facility]imhon Electrophysiologist: Caryl Comes 6/29/2020Weight:202lbs       Attempted call to patient and unable to reach.  Left detailed message per DPR regarding transmission. Transmission reviewed.    7/6/2020HeartLogic Heart Failure Indexdecreased to 22 suggesting possible fluid accumulation and not within normal threshold range.Index improved and decreased from 41 after taking extra Torsemide.  Prescribed:Torsemide20 mgTake 2 tablets (40 mg total) by mouth daily. Can take extra Torsemide as needed and Dr Haroldine Laws has discussed sliding scale. Potassium 20 mEq take 2 tablets daily.  Labs: 09/28/2018 Creatinine 0.90, BUN 11, Potassium 3.1, Sodium 140, GFR >60 07/04/2018 Creatinine0.98, BUN10, Potassium3.4, Sodium141, GFR>60 06/06/2018 Creatinine0.93, BUN7, Potassium 3.7, Sodium138, GFR>60  05/27/2018 Creatinine1.02, BUN8, Potassium 3.5, Sodium137, GFR>60 A complete set of results can be found in Results Review  Recommendations: Unable to reach.  Patient asked for HF clinic appt which was scheduled for 7/6 but appears to be a no show.   Follow-up plan: ICM clinic phone appointment on7/27/2020.  Copy of ICM check sent to Dr. Caryl Comes and Dr Haroldine Laws.     [place], RN 11/29/2018 11:38 AM

## 2018-12-02 ENCOUNTER — Encounter: Payer: Self-pay | Admitting: Family Medicine

## 2018-12-05 ENCOUNTER — Other Ambulatory Visit: Payer: Self-pay | Admitting: Internal Medicine

## 2018-12-05 ENCOUNTER — Encounter: Payer: Self-pay | Admitting: Internal Medicine

## 2018-12-08 ENCOUNTER — Encounter: Payer: Self-pay | Admitting: Family Medicine

## 2018-12-08 ENCOUNTER — Ambulatory Visit (INDEPENDENT_AMBULATORY_CARE_PROVIDER_SITE_OTHER): Payer: HMO | Admitting: Family Medicine

## 2018-12-08 ENCOUNTER — Other Ambulatory Visit: Payer: Self-pay

## 2018-12-08 VITALS — BP 118/88 | HR 117 | Ht 65.0 in | Wt 206.0 lb

## 2018-12-08 DIAGNOSIS — M940 Chondrocostal junction syndrome [Tietze]: Secondary | ICD-10-CM

## 2018-12-08 DIAGNOSIS — M999 Biomechanical lesion, unspecified: Secondary | ICD-10-CM

## 2018-12-08 NOTE — Assessment & Plan Note (Signed)
Decision today to treat with OMT was based on Physical Exam  After verbal consent patient was treated with HVLA, ME, FPR techniques in cervical, thoracic, rib lumbar and sacral areas  Patient tolerated the procedure well with improvement in symptoms  Patient given exercises, stretches and lifestyle modifications  See medications in patient instructions if given  Patient will follow up in 4-8 weeks 

## 2018-12-08 NOTE — Assessment & Plan Note (Signed)
Sleep rib syndrome.  Patient did have the rib again.  Responded very well to manipulation.  Held on any type of trigger point injections today but will consider that in the future.  Doing well with the home exercises at this point.  Patient is still motivated to do the exercises regularly continues to attempt to lose weight.  Follow-up with me again 5 to 6 weeks.

## 2018-12-08 NOTE — Patient Instructions (Signed)
Good to see you  See me again in 5-6 weeks  

## 2018-12-08 NOTE — Progress Notes (Signed)
[name] [address], [place] [address] Phone: [phone] Subjective:   [name], am serving as a scribe for Dr. Hulan Saas.  I'm seeing this patient by the request  of:    CC: Ankle pain, back pain follow-up  BPZ:[id number]   10/28/2018: Patient is healing fairly well with a lateral malleolus fracture.  This seems to be an acute on chronic.  Increase activity at this point outside [employer].  Patient still has some mild inflammation over the talar dome that we may need to monitor.  I believe though that patient is walking significantly better and will do well with conservative therapy.  Update 12/08/2018: LATOI GIRALDO is a 43 y.o. female coming in with complaint of left ankle pain. Is doing better. Is here for OMT for right shoulder and neck pain. Denies any radiating symptoms. Pain started to increase to last week.  Has had difficulty with sleep recently.  Has noticed more tightness recently.  Denies any radiation down the leg.  Return to workout on a more regular basis.     Past Medical History:  Diagnosis Date  . Acute on chronic systolic (congestive) heart failure ([place]) 04/29/2017  . Acute pain of right shoulder 06/16/2017  . AICD (automatic cardioverter/defibrillator) present   . Anxiety   . Arthritis    right shoulder   . Asthma   . CHF (congestive heart failure) ([place])   . Chronic combined systolic and diastolic heart failure ([place]) 03/05/2014  . Closed low lateral malleolus fracture 10/23/2013  . Cystitis 10/21/2017  . Depression   . Depression with anxiety 01/20/2013  . Diabetes mellitus without complication ([place])   . Diverticulosis   . Dyspnea    comes and goes intermittently mostly with exertion   . Essential hypertension    Prev followed by [facility]/ Cardiology   . Fibroid    age 43  . Gallstones   . Generalized abdominal cramping   . History of cardiomyopathy   . Hypertension   . IBS (irritable bowel syndrome)   .  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 Insomnia 05/07/2017  . Labral tear of shoulder 04/04/2015    Injected 04/04/2015 Injected 12/03/2015   . Migraine    "monthly" (08/03/2016)  . Myofascial pain 06/16/2017  . NICM (nonischemic cardiomyopathy) ([place]) 08/03/2016  . Nonallopathic lesion of lumbosacral region 11/16/2016  . Nonallopathic lesion of sacral region 11/16/2016  . Nonallopathic lesion of thoracic region 08/20/2014  . Nonspecific chest pain 04/28/2017  . OSA (obstructive sleep apnea) 01/02/2013   NPSG 2009:  AHI 9/hr. CPAP intolerance >> "smothering" Good tolerance of auto device (optimal pressure 12-13 on download).  - referred to Dr Gwenette Greet    . OSA on CPAP   . Ovarian cyst    1999; surgically removed  . Patellofemoral syndrome of both knees 10/16/2016  . 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 PVC (premature ventricular contraction) 06/23/2016  . Seizures ([place])    "as a child" (08/03/2016)  . Termination of pregnancy    due to cardiac risk   Past Surgical History:  Procedure Laterality Date  . CARDIAC CATHETERIZATION N/A 11/11/2015   Procedure: Right/Left Heart Cath and Coronary Angiography;  Surgeon: [place] Sine, MD;  Location: [place] CV LAB;  Service: Cardiovascular;  Laterality: N/A;  . CARDIAC CATHETERIZATION  ~ 2015  . CARDIAC DEFIBRILLATOR PLACEMENT  08/03/2016  . CESAREAN SECTION  1999  .  COLONOSCOPY WITH PROPOFOL N/A 04/21/2016   Procedure: COLONOSCOPY WITH PROPOFOL;  Surgeon: Jerene Bears, MD;  Location: [facility];  Service: Gastroenterology;  Laterality: N/A;  . ESOPHAGOGASTRODUODENOSCOPY (EGD) WITH PROPOFOL N/A 04/21/2016   Procedure: ESOPHAGOGASTRODUODENOSCOPY (EGD) WITH PROPOFOL;  Surgeon: Jerene Bears, MD;  Location: [facility];  Service: Gastroenterology;  Laterality: N/A;  . FOOT FRACTURE SURGERY Right ~ 2003  . FRACTURE SURGERY    . ICD IMPLANT N/A 08/03/2016   Procedure: ICD Implant;  Surgeon:  Deboraha Sprang, MD;  Location: [place] CV LAB;  Service: Cardiovascular;  Laterality: N/A;  . LAPAROSCOPIC CHOLECYSTECTOMY  12/2006  . LAPAROSCOPIC GASTRIC SLEEVE RESECTION    . LAPAROSCOPY ABDOMEN DIAGNOSTIC  2008   "cut bile duct w/gallbladder OR; had to go in later & fix leak; hospitalized for 2 months"  . LEFT HEART CATHETERIZATION WITH CORONARY ANGIOGRAM N/A 02/26/2014   Procedure: LEFT HEART CATHETERIZATION WITH CORONARY ANGIOGRAM;  Surgeon: Jettie Booze, MD;  Location: [facility] CATH LAB;  Service: Cardiovascular;  Laterality: N/A;  . OVARIAN CYST REMOVAL Right 1999  . PVC ABLATION N/A 12/01/2017   Procedure: PVC ABLATION;  Surgeon: Evans Lance, MD;  Location: [place] CV LAB;  Service: Cardiovascular;  Laterality: N/A;  . RIGHT HEART CATH N/A 08/05/2017   Procedure: RIGHT HEART CATH;  Surgeon: Jolaine Artist, MD;  Location: [place] CV LAB;  Service: Cardiovascular;  Laterality: N/A;  . TUBAL LIGATION  1999   Social History   Socioeconomic History  . Marital status: Married    Spouse name: Not on file  . Number of children: 1  . Years of education: Not on file  . Highest education level: Not on file  Occupational History  . Occupation: stay at home mom  Social Needs  . Financial resource strain: Not on file  . Food insecurity    Worry: Not on file    Inability: Not on file  . Transportation needs    Medical: Not on file    Non-medical: Not on file  Tobacco Use  . Smoking status: Never Smoker  . Smokeless tobacco: Never Used  Substance and Sexual Activity  . Alcohol use: No  . Drug use: No  . Sexual activity: Yes  Lifestyle  . Physical activity    Days per week: Not on file    Minutes per session: Not on file  . Stress: Not on file  Relationships  . Social Herbalist on phone: Not on file    Gets together: Not on file    Attends religious service: Not on file    Active member of club or organization: Not on file    Attends meetings of  clubs or organizations: Not on file    Relationship status: Not on file  Other Topics Concern  . Not on file  Social History Narrative  . Not on file   Allergies  Allergen Reactions  . Aspirin Anaphylaxis and Other (See Comments)    Wheezing, "but patient still takes if she needs to"  . Vancomycin Other (See Comments)    "did something to my kidneys," PROGRESSED TO KIDNEY FAILURE!!  . Contrast Media [Iodinated Diagnostic Agents] Other (See Comments)    Multiple CT contrast studies done over 2 weeks caused ARF  . Avocado Itching and Swelling  . Ciprofloxacin Itching and Rash  . Sulfa Antibiotics Itching and Rash   Family History  Problem Relation Age of Onset  . Emphysema Maternal Grandmother  smoked  . Heart disease Maternal Grandmother 59       MI  . Rheum arthritis Mother   . Allergies Daughter   . Colon cancer Neg Hx      Current Outpatient Medications (Cardiovascular):  .  amiodarone (PACERONE) 200 MG tablet, Take 1 tablet (200 mg total) by mouth daily. .  colestipol (COLESTID) 1 g tablet, Take 2 tablets (2 g total) by mouth 2 (two) times daily. .  ivabradine (CORLANOR) 7.5 MG TABS tablet, TAKE 1 TABLET (7.5 MG TOTAL) BY MOUTH 2 (TWO) TIMES DAILY WITH A MEAL. .  nitroGLYCERIN (NITROSTAT) 0.4 MG SL tablet, Place 1 tablet (0.4 mg total) under the tongue every 5 (five) minutes as needed for chest pain. .  sacubitril-valsartan (ENTRESTO) 97-103 MG, Take 1 tablet by mouth 2 (two) times daily. Marland Kitchen  spironolactone (ALDACTONE) 25 MG tablet, Take 1 tablet (25 mg total) by mouth daily. Marland Kitchen  torsemide (DEMADEX) 20 MG tablet, Take 2 tablets (40 mg total) by mouth daily.  Current Outpatient Medications (Respiratory):  .  albuterol (PROVENTIL HFA;VENTOLIN HFA) 108 (90 Base) MCG/ACT inhaler, Inhale 1-2 puffs into the lungs every 6 (six) hours as needed for wheezing or shortness of breath. .  fluticasone (FLONASE) 50 MCG/ACT nasal spray, Place 2 sprays into both nostrils daily as  needed for allergies. .  promethazine-dextromethorphan (PROMETHAZINE-DM) 6.25-15 MG/5ML syrup, Take 5 mLs by mouth 4 (four) times daily as needed for cough. .  SYMBICORT 160-4.5 MCG/ACT inhaler, INHALE 2 PUFFS INTO THE LUNGS 2 (TWO) TIMES DAILY AS NEEDED.  Current Outpatient Medications (Analgesics):  .  butalbital-acetaminophen-caffeine (FIORICET) 50-325-40 MG tablet, TAKE 1 TO 2 TABLETS BY MOUTH EVERY 6 HOURS AS NEEDED FOR HEADACHE .  Ibuprofen-Famotidine 800-26.6 MG TABS, Take 1 tablet by mouth 3 (three) times daily as needed.   Current Outpatient Medications (Other):  .  blood glucose meter kit and supplies, Dispense based on patient and insurance preference. Use up to two times daily as directed. (FOR ICD-10 E10.9, E11.9). Marland Kitchen  diclofenac sodium (VOLTAREN) 1 % GEL, Apply 4 g topically 4 (four) times daily. .  Diclofenac Sodium 2 % SOLN, Place 2 g onto the skin 2 (two) times daily. Marland Kitchen  dicyclomine (BENTYL) 20 MG tablet, Take 20 mg by mouth 2 (two) times daily as needed for spasms. Marland Kitchen  gabapentin (NEURONTIN) 100 MG capsule, Take 200 mg by mouth at bedtime as needed (for pain).  Marland Kitchen  KLOR-CON M20 20 MEQ tablet, TAKE 2 TABLETS (40 MEQ TOTAL) BY MOUTH DAILY. Marland Kitchen  lidocaine (LIDODERM) 5 %, Place 1 patch onto the skin daily. Remove & Discard patch within 12 hours or as directed by MD .  linaclotide (LINZESS) 145 MCG CAPS capsule, Take 1 capsule (145 mcg total) by mouth daily before breakfast. .  Multiple Vitamins-Minerals (MULTIVITAMIN GUMMIES WOMENS PO), Take 2 each by mouth daily. .  Vitamin D, Ergocalciferol, (DRISDOL) 1.25 MG (50000 UT) CAPS capsule, Take 1 capsule (50,000 Units total) by mouth every 7 (seven) days. Marland Kitchen  zolpidem (AMBIEN) 5 MG tablet, Take 1 tablet (5 mg total) by mouth at bedtime as needed for sleep. Marland Kitchen  omeprazole (PRILOSEC) 20 MG capsule, Take 20 mg by mouth daily.    Past medical history, social, surgical and family history all reviewed in electronic medical record.  No pertanent  information unless stated regarding to the chief complaint.   Review of Systems:  No headache, visual changes, nausea, vomiting, diarrhea, constipation, dizziness, abdominal pain, skin rash, fevers, chills, night sweats,  weight loss, swollen lymph nodes, body aches, joint swelling,, chest pain, shortness of breath, mood changes.  Positive muscle aches  Objective  Blood pressure 118/88, pulse (!) 117, height 5' 5"  (1.651 m), weight 206 lb (93.4 kg), SpO2 99 %.    General: No apparent distress alert and oriented x3 mood and affect normal, dressed appropriately.  HEENT: Pupils equal, extraocular movements intact  Respiratory: [name] speak in full sentences and does not appear short of breath  Cardiovascular: No lower extremity edema, non tender, no erythema  Skin: Warm dry intact with no signs of infection or rash on extremities or on axial skeleton.  Abdomen: Soft nontender  Neuro: Cranial nerves II through XII are intact, neurovascularly intact in all extremities with 2+ DTRs and 2+ pulses.  Lymph: No lymphadenopathy of posterior or anterior cervical chain or axillae bilaterally.  Gait normal with good balance and coordination.  MSK:  Non tender with full range of motion and good stability and symmetric strength and tone of shoulders, elbows, wrist, hip, knee and ankles bilaterally.   Back exam shows some tightness noted in the paraspinal musculature more in the thoracolumbar juncture right greater than left.  Pain in the paraspinal scapular region on the right side as well.  Negative straight leg test with the lower back.  Neck exam has near full range of motion.  Does have some pain with right-sided sidebending and rotation.  Osteopathic findings C2 flexed rotated and side bent right C6 flexed rotated and side bent left T3 extended rotated and side bent right inhaled third rib T7 extended rotated and side bent left L2 flexed rotated and side bent right Sacrum right on right      Impression and Recommendations:     This case required medical decision making of moderate complexity. The above documentation has been reviewed and is accurate and complete Lyndal Pulley, DO       Note: This dictation was prepared with Dragon dictation along with smaller phrase technology. Any transcriptional errors that result from this process are unintentional.

## 2018-12-12 ENCOUNTER — Ambulatory Visit (INDEPENDENT_AMBULATORY_CARE_PROVIDER_SITE_OTHER): Payer: HMO

## 2018-12-12 DIAGNOSIS — I5032 Chronic diastolic (congestive) heart failure: Secondary | ICD-10-CM

## 2018-12-12 DIAGNOSIS — Z95810 Presence of automatic (implantable) cardiac defibrillator: Secondary | ICD-10-CM

## 2018-12-13 ENCOUNTER — Telehealth: Payer: Self-pay

## 2018-12-13 NOTE — Progress Notes (Signed)
EPIC Encounter for ICM Monitoring  Patient Name: [name] is a 43 y.o. female Date: 12/13/2018 Primary Care Physican: Hoyt Koch, MD Primary Cardiologist: Smith/Bensimhon Electrophysiologist: Caryl Comes 6/29/2020Weight:202lbs                                                                Attempted call to patient and unable to reach.  Left detailed message per DPR regarding transmission. Transmission reviewed.    7/6192020 HeartLogic Heart Failure Indexincreased to 19 suggesting possible fluid accumulation.Index was 8 on 12/08/2018.  Prescribed:Torsemide20 mgTake 2 tablets (40 mg total) by mouth daily. Can take extra Torsemide as needed and Dr Haroldine Laws has discussed sliding scale. Potassium 20 mEq take 2 tablets daily.  Labs: 09/28/2018 Creatinine 0.90, BUN 11, Potassium 3.1, Sodium 140, GFR >60 07/04/2018 Creatinine0.98, BUN10, Potassium3.4, Sodium141, GFR>60 06/06/2018 Creatinine0.93, BUN7, Potassium 3.7, Sodium138, GFR>60  05/27/2018 Creatinine1.02, BUN8, Potassium 3.5, Sodium137, GFR>60 A complete set of results can be found in Results Review  Recommendations: Unable to reach.    Follow-up plan: ICM clinic phone appointment on7/27/2020 to recheck fluid levels.  Copy of ICM check sent to Dr. [name] Dr Haroldine Laws.     [place], RN 12/13/2018 1:50 PM

## 2018-12-13 NOTE — Telephone Encounter (Signed)
Remote ICM transmission received.  Attempted call to patient regarding ICM remote transmission and left detailed message, per DPR, to return call.    

## 2018-12-19 ENCOUNTER — Ambulatory Visit (INDEPENDENT_AMBULATORY_CARE_PROVIDER_SITE_OTHER): Payer: HMO

## 2018-12-19 DIAGNOSIS — Z95810 Presence of automatic (implantable) cardiac defibrillator: Secondary | ICD-10-CM

## 2018-12-19 DIAGNOSIS — I5032 Chronic diastolic (congestive) heart failure: Secondary | ICD-10-CM

## 2018-12-20 ENCOUNTER — Ambulatory Visit (INDEPENDENT_AMBULATORY_CARE_PROVIDER_SITE_OTHER): Payer: HMO | Admitting: *Deleted

## 2018-12-20 DIAGNOSIS — I428 Other cardiomyopathies: Secondary | ICD-10-CM | POA: Diagnosis not present

## 2018-12-20 LAB — CUP PACEART REMOTE DEVICE CHECK
Date Time Interrogation Session: 20200728173041
Implantable Lead Implant Date: 20180312
Implantable Lead Location: 753860
Implantable Lead Model: 293
Implantable Lead Serial Number: 422842
Implantable Pulse Generator Implant Date: 20180312
Pulse Gen Serial Number: 226601

## 2018-12-21 ENCOUNTER — Telehealth: Payer: Self-pay

## 2018-12-21 DIAGNOSIS — I5032 Chronic diastolic (congestive) heart failure: Secondary | ICD-10-CM

## 2018-12-21 DIAGNOSIS — Z95810 Presence of automatic (implantable) cardiac defibrillator: Secondary | ICD-10-CM

## 2018-12-21 NOTE — Progress Notes (Signed)
EPIC Encounter for ICM Monitoring  Patient Name: [name] is a 43 y.o. female Date: 12/21/2018 Primary Care Physican: Hoyt Koch, MD Primary Cardiologist: Smith/Bensimhon Electrophysiologist: Caryl Comes 6/29/2020Weight:202lbs  Attempted call to patient and unable to reach. Left detailed message per DPR regarding transmission. Transmission reviewed.   12/19/2018 HeartLogic Heart Failure Indexincreased from 19 to 36 suggesting possible worsening of fluid accumulation.Index was 8 on 12/08/2018.  Prescribed:Torsemide20 mgTake 2 tablets (40 mg total) by mouth daily. Can take extra Torsemide as needed and Dr Haroldine Laws has discussed sliding scale. Potassium 20 mEq take 2 tablets daily.  Labs: 09/28/2018 Creatinine 0.90, BUN 11, Potassium 3.1, Sodium 140, GFR >60 07/04/2018 Creatinine0.98, BUN10, Potassium3.4, Sodium141, GFR>60 06/06/2018 Creatinine0.93, BUN7, Potassium 3.7, Sodium138, GFR>60  05/27/2018 Creatinine1.02, BUN8, Potassium 3.5, Sodium137, GFR>60 A complete set of results can be found in Results Review  Recommendations: Unable to reach.   Follow-up plan: ICM clinic phone appointment on8/09/2018 to recheck fluid levels.  Copy of ICM check sent to Dr. [name] Dr Haroldine Laws.     [place], RN 12/21/2018 8:33 AM

## 2018-12-21 NOTE — Telephone Encounter (Signed)
Remote ICM transmission received.  Attempted call to patient regarding ICM remote transmission and left detailed message, per DPR, to return call.    

## 2018-12-22 ENCOUNTER — Telehealth: Payer: Self-pay

## 2018-12-22 NOTE — Telephone Encounter (Signed)
Called patient to reschedule 8/27 appointment

## 2018-12-23 ENCOUNTER — Telehealth: Payer: Self-pay

## 2018-12-23 NOTE — Telephone Encounter (Signed)
Called patient to reschedule 8/27 appointment. Patient did not answer.

## 2018-12-26 ENCOUNTER — Ambulatory Visit: Payer: HMO | Admitting: Family Medicine

## 2018-12-27 ENCOUNTER — Other Ambulatory Visit: Payer: Self-pay

## 2018-12-27 NOTE — Patient Outreach (Signed)
  [facility]) Care Management Chronic Special Needs Program    12/27/2018  Name: [name], DOB: 1975-06-17  MRN: [id number]   [name] is enrolled in a chronic special needs plan for Heart Failure. RNCM called to follow up and review individualized care plan. No answer. HIPAA complaint message left.   Plan: RNCM will outreach again within 1-2 weeks.   Thea Silversmith, RN, MSN, [place] [phone]

## 2018-12-28 ENCOUNTER — Ambulatory Visit (INDEPENDENT_AMBULATORY_CARE_PROVIDER_SITE_OTHER): Payer: HMO

## 2018-12-28 DIAGNOSIS — I5032 Chronic diastolic (congestive) heart failure: Secondary | ICD-10-CM

## 2018-12-28 DIAGNOSIS — Z95810 Presence of automatic (implantable) cardiac defibrillator: Secondary | ICD-10-CM

## 2018-12-28 NOTE — Progress Notes (Signed)
EPIC Encounter for ICM Monitoring  Patient Name: [name] is a 43 y.o. female Date: 12/28/2018 Primary Care Physican: Hoyt Koch, MD Primary Cardiologist: [facility]/Bensimhon Electrophysiologist: Caryl Comes 6/29/2020Weight:202lbs  Transmission reviewed and results send via mychart.  12/27/2018 HeartLogic Heart Failure Indexdecreased to 20from 36 suggesting improvement but still not back at normal threshold.Indexwas 8 on 12/08/2018.  Prescribed:Torsemide20 mgTake 2 tablets (40 mg total) by mouth daily. Can take extra Torsemide as needed and Dr Haroldine Laws has discussed sliding scale. Potassium 20 mEq take 2 tablets daily.  Labs: 09/28/2018 Creatinine 0.90, BUN 11, Potassium 3.1, Sodium 140, GFR >60 07/04/2018 Creatinine0.98, BUN10, Potassium3.4, Sodium141, GFR>60 06/06/2018 Creatinine0.93, BUN7, Potassium 3.7, Sodium138, GFR>60  05/27/2018 Creatinine1.02, BUN8, Potassium 3.5, Sodium137, GFR>60 A complete set of results can be found in Results Review  Recommendations: Call if experiencing fluid symptoms.  Follow-up plan: ICM clinic phone appointment on9/8/2020and will continue to monitor for alerts and worsening HF index.  Copy of ICM check sent to Dr. [name] Dr Haroldine Laws.      [place], RN 12/28/2018 2:48 PM

## 2019-01-02 ENCOUNTER — Encounter: Payer: Self-pay | Admitting: Cardiology

## 2019-01-02 ENCOUNTER — Encounter: Payer: Self-pay | Admitting: Internal Medicine

## 2019-01-02 MED ORDER — COLESTIPOL HCL 1 G PO TABS: 2.0000 g | ORAL_TABLET | Freq: Two times a day (BID) | ORAL | 1 refills | Status: DC

## 2019-01-02 NOTE — Progress Notes (Signed)
Remote ICD transmission.   

## 2019-01-03 ENCOUNTER — Other Ambulatory Visit: Payer: Self-pay

## 2019-01-03 NOTE — Patient Outreach (Signed)
  [facility]) Care Management Chronic Special Needs Program    01/03/2019  Name: [name], DOB: 06/04/1975  MRN: [id number]   [name] is enrolled in a chronic special needs plan for Heart Failure. RNCM called to follow up and review individualized care plan. No answer. HIIPAA compliant message left. 2nd outreach attempt.  Plan: outreach within 2 weeks.  Thea Silversmith, RN, MSN, [place] [phone]

## 2019-01-09 ENCOUNTER — Ambulatory Visit (INDEPENDENT_AMBULATORY_CARE_PROVIDER_SITE_OTHER): Payer: HMO

## 2019-01-09 ENCOUNTER — Telehealth: Payer: Self-pay

## 2019-01-09 DIAGNOSIS — I5032 Chronic diastolic (congestive) heart failure: Secondary | ICD-10-CM

## 2019-01-09 DIAGNOSIS — Z95810 Presence of automatic (implantable) cardiac defibrillator: Secondary | ICD-10-CM

## 2019-01-09 NOTE — Telephone Encounter (Signed)
Remote ICM transmission received.  Attempted call to patient regarding ICM remote transmission and left detailed message, per DPR, to return call.    

## 2019-01-09 NOTE — Progress Notes (Signed)
EPIC Encounter for ICM Monitoring  Patient Name: [name] is a 43 y.o. female Date: 01/09/2019 Primary Care Physican: Hoyt Koch, MD Primary Cardiologist: Smith/Bensimhon Electrophysiologist: Caryl Comes 6/29/2020Weight:202lbs  Attempted call to patient and unable to reach.  Left detailed message per DPR regarding transmission. Transmission reviewed.   HeartLogic Heart Failure Index:  01/07/2019 Index 25 01/03/2019 Index 30  12/25/2018 Index 18 12/08/2018 Index 8 < threshold  Prescribed:Torsemide20 mgTake 2 tablets (40 mg total) by mouth daily. Can take extra Torsemide as needed and Dr Haroldine Laws has discussed sliding scale. Potassium 20 mEq take 2 tablets daily.  Labs: 09/28/2018 Creatinine 0.90, BUN 11, Potassium 3.1, Sodium 140, GFR >60 01/02/2019 Creatinine0.98, BUN10, Potassium3.4, Sodium141, GFR>60 06/06/2018 Creatinine0.93, BUN7, Potassium 3.7, Sodium138, GFR>60  05/27/2018 Creatinine1.02, BUN8, Potassium 3.5, Sodium137, GFR>60 A complete set of results can be found in Results Review  Recommendations: Unable to reach and left message to return call.    Follow-up plan: ICM clinic phone appointment on9/8/2020and will continue to monitor for alerts and worsening HF index.  Copy of ICM check sent to Dr. [name] Dr Haroldine Laws.      [name], RN 01/09/2019 1:12 PM

## 2019-01-18 ENCOUNTER — Ambulatory Visit (INDEPENDENT_AMBULATORY_CARE_PROVIDER_SITE_OTHER): Payer: HMO

## 2019-01-18 DIAGNOSIS — Z95810 Presence of automatic (implantable) cardiac defibrillator: Secondary | ICD-10-CM

## 2019-01-18 DIAGNOSIS — I5032 Chronic diastolic (congestive) heart failure: Secondary | ICD-10-CM

## 2019-01-18 NOTE — Progress Notes (Signed)
EPIC Encounter for ICM Monitoring  Patient Name: [name] is a 43 y.o. female Date: 01/18/2019 Primary Care Physican: Hoyt Koch, MD Primary Cardiologist: Smith/Bensimhon Electrophysiologist: Caryl Comes 6/29/2020Weight:202lbs 01/18/2019 Weight: 196 lbs  Spoke with patient and she feels tired all the time.  She has taken extra Torsemide occasionally for increased weight.  She feels like 196 lbs is her baseline.   HeartLogic Heart Failure Index: 01/17/2019 Index 34 01/11/2019 Index 41 01/07/2019 Index 25 01/03/2019 Index 30  12/25/2018 Index 18 12/08/2018 Index 8 < threshold  Prescribed:Torsemide20 mgTake 2 tablets (40 mg total) by mouth daily. Can take extra Torsemide as needed and Dr Haroldine Laws has discussed sliding scale. Potassium 20 mEq take 2 tablets daily.  Labs: 09/28/2018 Creatinine 0.90, BUN 11, Potassium 3.1, Sodium 140, GFR >60 01/02/2019 Creatinine0.98, BUN10, Potassium3.4, Sodium141, GFR>60 06/06/2018 Creatinine0.93, BUN7, Potassium 3.7, Sodium138, GFR>60  05/27/2018 Creatinine1.02, BUN8, Potassium 3.5, Sodium137, GFR>60 A complete set of results can be found in Results Review  Recommendations: Advised to call if condition worsens.  Encouraged to make an appointment with Dr Haroldine Laws since the last visit was several months ago.   She will continue to take extra Torsemide when needed per her discussions with Dr Haroldine Laws.  Follow-up plan: ICM clinic phone appointment on9/8/2020and will continue to monitor for alerts and worsening HF index.  Copy of ICM check sent to Dr. [name] Dr Haroldine Laws.      [place], RN 01/18/2019 8:05 AM

## 2019-01-19 ENCOUNTER — Ambulatory Visit: Payer: HMO | Admitting: Family Medicine

## 2019-01-19 ENCOUNTER — Other Ambulatory Visit: Payer: Self-pay

## 2019-01-19 NOTE — Patient Outreach (Addendum)
  [facility]) Care Management Chronic Special Needs Program    01/19/2019  Name: [name], DOB: 05/28/1975  MRN: [id number]   [name] is enrolled in a chronic special needs plan for Heart Failure. RNCM attempted to call client to follow up; review care plan and discuss the following recommendation per primary care provider: for client to call and schedule a visit to discuss; review her records and counsel on if client has need for a bone scan-attempted to outreach x4, but unsuccessful.  HIPPA compliant message left.   Pharmacy referral (for medication review and medication management strategies) and Social work referral (for feelings of being overwhelmed resources) placed at initial assessment, however they were unable to reach client also.  Per notes, client has been in contact with primary care and cardiology-device clinic.   Individualized care plan reviewed and updated per policy/procedure.  Plan: RNCM will update primary care. RNCM will send updated care plan to client. RNCM will outreach to client within the next 3 months for follow up.  Thea Silversmith, RN, MSN, [place] [phone]

## 2019-01-23 ENCOUNTER — Telehealth (HOSPITAL_COMMUNITY): Payer: Self-pay | Admitting: Cardiology

## 2019-01-23 DIAGNOSIS — I5022 Chronic systolic (congestive) heart failure: Secondary | ICD-10-CM

## 2019-01-23 NOTE — Telephone Encounter (Signed)
    Reviewed pt's HeartLogic reading, will send to Dr Haroldine Laws for review

## 2019-01-23 NOTE — Telephone Encounter (Signed)
Double torsemide to 40 bid and also double potassium. Check BMETand repeat transmission on Friday am

## 2019-01-23 NOTE — Telephone Encounter (Signed)
Patient left message on triage line with c/o increased SOB, HA, pressure in abdomen and swelling. Reports she increased her torsemide for a few days with out relief Would like a work in appt

## 2019-01-24 MED ORDER — TORSEMIDE 20 MG PO TABS: 40.0000 mg | ORAL_TABLET | Freq: Two times a day (BID) | ORAL | 3 refills | Status: DC

## 2019-01-24 NOTE — Telephone Encounter (Signed)
Pt states she was only taking 73meq of potassium.

## 2019-01-24 NOTE — Telephone Encounter (Signed)
Pt aware and agreeable with plan. Lab appt scheduled.  

## 2019-01-26 ENCOUNTER — Other Ambulatory Visit (HOSPITAL_COMMUNITY): Payer: HMO

## 2019-01-31 ENCOUNTER — Ambulatory Visit (INDEPENDENT_AMBULATORY_CARE_PROVIDER_SITE_OTHER): Payer: HMO

## 2019-01-31 DIAGNOSIS — I5022 Chronic systolic (congestive) heart failure: Secondary | ICD-10-CM | POA: Diagnosis not present

## 2019-01-31 DIAGNOSIS — Z95810 Presence of automatic (implantable) cardiac defibrillator: Secondary | ICD-10-CM | POA: Diagnosis not present

## 2019-02-01 NOTE — Progress Notes (Signed)
EPIC Encounter for ICM Monitoring  Patient Name: [name] is a 43 y.o. female Date: 02/01/2019 Primary Care Physican: Hoyt Koch, MD Primary Cardiologist: Smith/Bensimhon Electrophysiologist: Caryl Comes 6/29/2020Weight:202lbs 01/18/2019 Weight: 196 lbs  Attempted call to patient and unable to reach.  Transmission reviewed.  Dr Haroldine Laws increased Torsemide on 8/31 due to patient's symptoms.   HeartLogic Heart Failure Index: 01/28/2019 Index 20 -  suggesting improvement and trending toward normal threshold. 01/17/2019 Index 34 01/11/2019 Index 41 12/08/2018 Index 8 < threshold  Prescribed:Torsemide20 mgTake 2 tablets (40 mg total) by mouth twice a day. Potassium 20 mEq take 2 tablets (40 mEq total) daily.  Can take extra Torsemide as needed and Dr Haroldine Laws has discussed sliding scale.  Labs: 09/28/2018 Creatinine 0.90, BUN 11, Potassium 3.1, Sodium 140, GFR >60 01/02/2019 Creatinine0.98, BUN10, Potassium3.4, Sodium141, GFR>60 06/06/2018 Creatinine0.93, BUN7, Potassium 3.7, Sodium138, GFR>60  05/27/2018 Creatinine1.02, BUN8, Potassium 3.5, Sodium137, GFR>60 A complete set of results can be found in Results Review  Recommendations: Unable to reach.    Follow-up plan: ICM clinic phone appointment on 03/22/2019 and will continue to monitor for alerts.  91 day device clinic remote transmission scheduled 03/21/2019.        Copy of ICM check sent to Dr. Haroldine Laws and Dr Caryl Comes.     [place], RN 02/01/2019 9:21 AM

## 2019-02-06 ENCOUNTER — Ambulatory Visit (INDEPENDENT_AMBULATORY_CARE_PROVIDER_SITE_OTHER): Payer: HMO

## 2019-02-06 DIAGNOSIS — Z95810 Presence of automatic (implantable) cardiac defibrillator: Secondary | ICD-10-CM

## 2019-02-06 DIAGNOSIS — I5022 Chronic systolic (congestive) heart failure: Secondary | ICD-10-CM

## 2019-02-06 NOTE — Progress Notes (Signed)
EPIC Encounter for ICM Monitoring  Patient Name: [name] is a 43 y.o. female Date: 02/06/2019 Primary Care Physican: Hoyt Koch, MD Primary Cardiologist: Smith/Bensimhon Electrophysiologist: Caryl Comes 6/29/2020Weight:202lbs 01/18/2019 Weight: 196 lbs  Transmission reviewed.   HeartLogic Heart Failure Index: 02/04/2019 Index 6 - Index has reached recovery threshold of 6 and within normal range  01/28/2019 Index 20 -  suggesting improvement and trending toward normal threshold. 01/17/2019 Index 34 01/11/2019 Index 41 12/08/2018 Index 8 < threshold  Prescribed:Torsemide20 mgTake 2 tablets (40 mg total) by mouth twice a day. Potassium 20 mEq take 2 tablets (40 mEq total) daily.  Can take extra Torsemide as needed and Dr Haroldine Laws has discussed sliding scale.  Labs: 09/28/2018 Creatinine 0.90, BUN 11, Potassium 3.1, Sodium 140, GFR >60 01/02/2019 Creatinine0.98, BUN10, Potassium3.4, Sodium141, GFR>60 06/06/2018 Creatinine0.93, BUN7, Potassium 3.7, Sodium138, GFR>60  05/27/2018 Creatinine1.02, BUN8, Potassium 3.5, Sodium137, GFR>60 A complete set of results can be found in Results Review  Recommendations: None   Follow-up plan: ICM clinic phone appointment on 03/22/2019 and will continue to monitor for alerts.  91 day device clinic remote transmission scheduled 03/21/2019.        Copy of ICM check sent to Dr Caryl Comes.    [place], RN 02/06/2019 9:25 AM

## 2019-02-08 ENCOUNTER — Other Ambulatory Visit (HOSPITAL_COMMUNITY): Payer: Self-pay | Admitting: Cardiology

## 2019-02-09 ENCOUNTER — Other Ambulatory Visit: Payer: Self-pay

## 2019-02-09 MED ORDER — ALBUTEROL SULFATE HFA 108 (90 BASE) MCG/ACT IN AERS: 1.0000 | INHALATION_SPRAY | Freq: Four times a day (QID) | RESPIRATORY_TRACT | 1 refills | Status: DC | PRN

## 2019-02-10 ENCOUNTER — Other Ambulatory Visit (INDEPENDENT_AMBULATORY_CARE_PROVIDER_SITE_OTHER): Payer: HMO

## 2019-02-10 ENCOUNTER — Encounter: Payer: Self-pay | Admitting: Internal Medicine

## 2019-02-10 ENCOUNTER — Ambulatory Visit (INDEPENDENT_AMBULATORY_CARE_PROVIDER_SITE_OTHER): Payer: HMO | Admitting: Internal Medicine

## 2019-02-10 ENCOUNTER — Other Ambulatory Visit: Payer: Self-pay

## 2019-02-10 VITALS — BP 130/80 | HR 96 | Temp 98.6°F | Ht 65.0 in | Wt 206.0 lb

## 2019-02-10 DIAGNOSIS — I5022 Chronic systolic (congestive) heart failure: Secondary | ICD-10-CM

## 2019-02-10 DIAGNOSIS — R1032 Left lower quadrant pain: Secondary | ICD-10-CM

## 2019-02-10 LAB — POCT URINALYSIS DIPSTICK
Bilirubin, UA: NEGATIVE
Blood, UA: NEGATIVE
Glucose, UA: NEGATIVE
Ketones, UA: NEGATIVE
Leukocytes, UA: NEGATIVE
Nitrite, UA: NEGATIVE
Protein, UA: POSITIVE — AB
Spec Grav, UA: >=1.03 — AB (ref 1.010–1.025)
Urobilinogen, UA: 0.2 E.U./dL
pH, UA: 6 (ref 5.0–8.0)

## 2019-02-10 LAB — COMPREHENSIVE METABOLIC PANEL
ALT: 14 U/L (ref 0–35)
AST: 16 U/L (ref 0–37)
Albumin: 4 g/dL (ref 3.5–5.2)
Alkaline Phosphatase: 83 U/L (ref 39–117)
BUN: 8 mg/dL (ref 6–23)
CO2: 28 mEq/L (ref 19–32)
Calcium: 9.1 mg/dL (ref 8.4–10.5)
Chloride: 104 mEq/L (ref 96–112)
Creatinine, Ser: 0.79 mg/dL (ref 0.40–1.20)
GFR: 95.29 mL/min (ref >60.00)
Glucose, Bld: 87 mg/dL (ref 70–99)
Potassium: 3.3 mEq/L — ABNORMAL LOW (ref 3.5–5.1)
Sodium: 139 mEq/L (ref 135–145)
Total Bilirubin: 0.3 mg/dL (ref 0.2–1.2)
Total Protein: 7.3 g/dL (ref 6.0–8.3)

## 2019-02-10 LAB — CBC
HCT: 36.9 % (ref 36.0–46.0)
Hemoglobin: 11.7 g/dL — ABNORMAL LOW (ref 12.0–15.0)
MCHC: 31.7 g/dL (ref 30.0–36.0)
MCV: 80.1 fl (ref 78.0–100.0)
Platelets: 184 10*3/uL (ref 150.0–400.0)
RBC: 4.61 Mil/uL (ref 3.87–5.11)
RDW: 16.2 % — ABNORMAL HIGH (ref 11.5–15.5)
WBC: 5.8 10*3/uL (ref 4.0–10.5)

## 2019-02-10 LAB — POCT URINE PREGNANCY: Preg Test, Ur: NEGATIVE

## 2019-02-10 LAB — LIPASE: Lipase: 40 U/L (ref 11.0–59.0)

## 2019-02-10 MED ORDER — ALBUTEROL SULFATE HFA 108 (90 BASE) MCG/ACT IN AERS: 1.0000 | INHALATION_SPRAY | Freq: Four times a day (QID) | RESPIRATORY_TRACT | 1 refills | Status: DC | PRN

## 2019-02-10 MED ORDER — COLESTIPOL HCL 1 G PO TABS: 2.0000 g | ORAL_TABLET | Freq: Two times a day (BID) | ORAL | 1 refills | Status: DC

## 2019-02-10 MED ORDER — METRONIDAZOLE 500 MG PO TABS: 500.0000 mg | ORAL_TABLET | Freq: Two times a day (BID) | ORAL | 0 refills | Status: DC

## 2019-02-10 NOTE — Progress Notes (Signed)
   Subjective:   Patient ID: [name], female    DOB: Nov 26, 1975, 43 y.o.   MRN: [id number]  HPI The patient is a 43 YO female coming in for concerns about left lower abdomen pain. She was thinking initially it could be related to ovary. Some vaginal odor. No discharge. No new sexual partner. Having also some loose stools in the same time period which is about 2 weeks. Pain started around the same time. About 3-4/10. Feels like a lump or something low abdomen left. Hurts when she walks. Denies fevers or chills. Denies blood in stool. Denies nausea or vomiting. Overall is stable during this time. Has not tried anything for this really. Denies blood in urine. Denies pain with urination.   Review of Systems  Constitutional: Negative.   HENT: Negative.   Eyes: Negative.   Respiratory: Negative for cough, chest tightness and shortness of breath.   Cardiovascular: Negative for chest pain, palpitations and leg swelling.  Gastrointestinal: Positive for abdominal pain and diarrhea. Negative for abdominal distention, anal bleeding, blood in stool, constipation, nausea and vomiting.  Musculoskeletal: Negative.   Skin: Negative.   Neurological: Negative.   Psychiatric/Behavioral: Negative.     Objective:  Physical Exam Constitutional:      Appearance: She is well-developed.  HENT:     Head: Normocephalic and atraumatic.  Neck:     Musculoskeletal: Normal range of motion.  Cardiovascular:     Rate and Rhythm: Normal rate and regular rhythm.  Pulmonary:     Effort: Pulmonary effort is normal. No respiratory distress.     Breath sounds: Normal breath sounds. No wheezing or rales.  Abdominal:     General: Bowel sounds are normal. There is no distension.     Palpations: Abdomen is soft.     Tenderness: There is abdominal tenderness. There is no rebound.     Comments: Mild tenderness low abdomen left, no pain in the midline or in the right abdomen  Skin:    General: Skin is warm and dry.   Neurological:     Mental Status: She is alert and oriented to person, place, and time.     Coordination: Coordination normal.     Vitals:   02/10/19 1530  BP: 130/80  Pulse: 96  Temp: 98.6 F (37 C)  TempSrc: Oral  SpO2: 99%  Weight: 206 lb (93.4 kg)  Height: 5\' 5"  (1.651 m)    Assessment & Plan:

## 2019-02-10 NOTE — Assessment & Plan Note (Signed)
Checking CBC, CMP, lipase. Rx metronidazole to cover BV. U/A done not consistent with infection and no blood making kidney stone unlikely. Pregnancy test done in office which is negative ruling out ectopic. If normal labs will get pelvic US to check for ovarian cysts or fibroids.

## 2019-02-10 NOTE — Patient Instructions (Signed)
We are checking the labs today.   We have sent in metronidazole to take 1 pill twice a day for 1 week to clear the odor.   If we cannot find out with the labs we will get an ultrasound to check the ovaries.

## 2019-02-15 ENCOUNTER — Encounter (HOSPITAL_COMMUNITY): Payer: Self-pay

## 2019-02-15 ENCOUNTER — Other Ambulatory Visit (HOSPITAL_COMMUNITY): Payer: Self-pay

## 2019-02-15 ENCOUNTER — Telehealth (HOSPITAL_COMMUNITY): Payer: Self-pay | Admitting: Pharmacist

## 2019-02-15 MED ORDER — SPIRONOLACTONE 25 MG PO TABS: 25.0000 mg | ORAL_TABLET | Freq: Every day | ORAL | 2 refills | Status: DC

## 2019-02-15 MED ORDER — IVABRADINE HCL 7.5 MG PO TABS: ORAL_TABLET | ORAL | 2 refills | Status: DC

## 2019-02-15 NOTE — Telephone Encounter (Signed)
Patient Advocate Encounter   Received notification from [employer] that prior authorization for Corlanor is required.   PA submitted on CoverMyMeds Key [id number] Status is pending   Will continue to follow.  Audry Riles, PharmD, BCPS, CPP [facility] Pharmacist [phone]

## 2019-02-17 ENCOUNTER — Telehealth (HOSPITAL_COMMUNITY): Payer: Self-pay | Admitting: Pharmacist

## 2019-02-17 NOTE — Telephone Encounter (Signed)
Advanced Heart Failure Patient Advocate Encounter  Prior Authorization for Corlanor has been approved.    PA# [id number] Effective dates: 02/16/2019 through 02/16/2020  Audry Riles, PharmD, BCPS, [facility] Pharmacist [phone]

## 2019-02-20 ENCOUNTER — Telehealth (HOSPITAL_COMMUNITY): Payer: Self-pay | Admitting: Pharmacy Technician

## 2019-02-20 NOTE — Telephone Encounter (Signed)
Co-pay for 30 day supply of Corlanor 7.5mg  $0.00  [place]

## 2019-02-24 ENCOUNTER — Ambulatory Visit
Admission: RE | Admit: 2019-02-24 | Discharge: 2019-02-24 | Disposition: A | Discharge status: Home / Self Care | Payer: HMO | Source: Ambulatory Visit | Attending: Internal Medicine | Admitting: Internal Medicine

## 2019-02-24 DIAGNOSIS — R1032 Left lower quadrant pain: Secondary | ICD-10-CM

## 2019-02-24 DIAGNOSIS — R102 Pelvic and perineal pain: Secondary | ICD-10-CM | POA: Diagnosis not present

## 2019-03-06 ENCOUNTER — Ambulatory Visit (INDEPENDENT_AMBULATORY_CARE_PROVIDER_SITE_OTHER): Payer: HMO

## 2019-03-06 DIAGNOSIS — Z95810 Presence of automatic (implantable) cardiac defibrillator: Secondary | ICD-10-CM | POA: Diagnosis not present

## 2019-03-06 DIAGNOSIS — I5022 Chronic systolic (congestive) heart failure: Secondary | ICD-10-CM

## 2019-03-07 ENCOUNTER — Telehealth: Payer: Self-pay

## 2019-03-07 NOTE — Telephone Encounter (Signed)
Remote ICM transmission received.  Attempted call to patient regarding ICM remote transmission and left detailed message per DPR to return call.   

## 2019-03-07 NOTE — Progress Notes (Signed)
EPIC Encounter for ICM Monitoring  Patient Name: [name] is a 43 y.o. female Date: 03/07/2019 Primary Care Physican: Hoyt Koch, MD Primary Cardiologist: Smith/Bensimhon Electrophysiologist: Caryl Comes 6/29/2020Weight:202lbs 01/18/2019 Weight: 196 lbs  Attempted call to patient and unable to reach.  Left detailed message per DPR regarding transmission. Transmission reviewed.   HeartLogic Heart Failure Index: 03/03/2019 Index Alert at 15 suggesting possible fluid accumulation 02/04/2019 Index 6 - Index has reached recovery threshold of 6 and within normal range  01/28/2019 Index 20 - suggesting improvement and trending toward normal threshold.  Prescribed:Torsemide20 mgTake 2 tablets (40 mg total) by mouthtwice a day. Potassium 20 mEq take 2 tablets(40 mEq total)daily. Can take extra Torsemide as needed and Dr Haroldine Laws has discussed sliding scale.  Labs: 02/10/2019 Creatinine 0.79, BUN 8,   Potassium 3.3, Sodium 139, GFR 95.29 09/28/2018 Creatinine 0.90, BUN 11, Potassium 3.1, Sodium 140, GFR >60 01/02/2019 Creatinine0.98, BUN10, Potassium3.4, Sodium141, GFR>60 06/06/2018 Creatinine0.93, BUN7, Potassium 3.7, Sodium138, GFR>60  05/27/2018 Creatinine1.02, BUN8, Potassium 3.5, Sodium137, GFR>60 A complete set of results can be found in Results Review  Recommendations: Unable to reach.    Follow-up plan: ICM clinic phone appointment on 03/13/2019 to recheck fluid levels.   91 day device clinic remote transmission 03/21/2019.          Copy of ICM check sent to Dr. Caryl Comes and Dr Haroldine Laws.                 [name], RN 03/07/2019 3:54 PM

## 2019-03-13 ENCOUNTER — Ambulatory Visit (INDEPENDENT_AMBULATORY_CARE_PROVIDER_SITE_OTHER): Payer: HMO

## 2019-03-13 DIAGNOSIS — I5022 Chronic systolic (congestive) heart failure: Secondary | ICD-10-CM

## 2019-03-13 DIAGNOSIS — Z95810 Presence of automatic (implantable) cardiac defibrillator: Secondary | ICD-10-CM

## 2019-03-17 NOTE — Progress Notes (Signed)
EPIC Encounter for ICM Monitoring  Patient Name: [name] is a 43 y.o. female Date: 03/17/2019 Primary Care Physican: Hoyt Koch, MD Electrophysiologist: Caryl Comes 01/18/2019 Weight: 196 lbs  Transmission reviewed and results sent via mychart.   HeartLogic Heart Failure Index: 03/12/2019 Index 0 - has returned to normal. 03/03/2019 Index Alert at 15 suggesting possible fluid accumulation  Prescribed:Torsemide20 mgTake 2 tablets (40 mg total) by mouthtwice a day. Potassium 20 mEq take 2 tablets(40 mEq total)daily. Can take extra Torsemide as needed and Dr Haroldine Laws has discussed sliding scale.  Labs: 02/10/2019 Creatinine 0.79, BUN 8,   Potassium 3.3, Sodium 139, GFR 95.29 09/28/2018 Creatinine 0.90, BUN 11, Potassium 3.1, Sodium 140, GFR >60 01/02/2019 Creatinine0.98, BUN10, Potassium3.4, Sodium141, GFR>60 06/06/2018 Creatinine0.93, BUN7, Potassium 3.7, Sodium138, GFR>60  05/27/2018 Creatinine1.02, BUN8, Potassium 3.5, Sodium137, GFR>60 A complete set of results can be found in Results Review  Recommendations: No changes.    Follow-up plan: ICM clinic phone appointment on 04/24/2019.   91 day device clinic remote transmission 03/21/2019.           Copy of ICM check sent to Dr. Caryl Comes.     [place], RN 03/17/2019 11:50 AM

## 2019-03-21 ENCOUNTER — Encounter: Payer: HMO | Admitting: *Deleted

## 2019-03-21 ENCOUNTER — Other Ambulatory Visit: Payer: Self-pay

## 2019-03-21 ENCOUNTER — Ambulatory Visit (INDEPENDENT_AMBULATORY_CARE_PROVIDER_SITE_OTHER)
Admission: RE | Admit: 2019-03-21 | Discharge: 2019-03-21 | Disposition: A | Discharge status: Home / Self Care | Payer: HMO | Source: Ambulatory Visit | Attending: Internal Medicine | Admitting: Internal Medicine

## 2019-03-21 ENCOUNTER — Encounter: Payer: Self-pay | Admitting: Internal Medicine

## 2019-03-21 ENCOUNTER — Ambulatory Visit (INDEPENDENT_AMBULATORY_CARE_PROVIDER_SITE_OTHER): Payer: HMO | Admitting: Internal Medicine

## 2019-03-21 VITALS — BP 130/90 | HR 108 | Temp 98.3°F | Ht 65.0 in | Wt 213.0 lb

## 2019-03-21 DIAGNOSIS — M25552 Pain in left hip: Secondary | ICD-10-CM

## 2019-03-21 NOTE — Assessment & Plan Note (Signed)
Checking x-ray today. Given exercises to do for potential hip bursitis and discussed this possibility with her today.

## 2019-03-21 NOTE — Progress Notes (Signed)
   Subjective:   Patient ID: [name], female    DOB: December 04, 1975, 43 y.o.   MRN: [id number]  HPI The patient is a 43 YO female coming in for concerns about left hip pain. Started around the time she was having ovary pain. This is now gone. She has been having left hip pain 2-3 weeks now. Originally this was more mild. Worsens throughout the day. When she walks or does more this does seem to improve. Not bad when she wakes up. Uses tylenol PM to help her sleep and ibuprofen during the day. This is helping her to get through the day but not well. Pain now 7/10 in the last several days. Denies recent falls. Thinks her legs may be separate lengths and feels some unequal when walking sometimes.   Review of Systems  Constitutional: Negative.   HENT: Negative.   Eyes: Negative.   Respiratory: Negative for cough, chest tightness and shortness of breath.   Cardiovascular: Negative for chest pain, palpitations and leg swelling.  Gastrointestinal: Negative for abdominal distention, abdominal pain, constipation, diarrhea, nausea and vomiting.  Musculoskeletal: Positive for arthralgias and myalgias.  Skin: Negative.   Neurological: Negative.   Psychiatric/Behavioral: Negative.     Objective:  Physical Exam Constitutional:      Appearance: She is well-developed.  HENT:     Head: Normocephalic and atraumatic.  Neck:     Musculoskeletal: Normal range of motion.  Cardiovascular:     Rate and Rhythm: Normal rate and regular rhythm.  Pulmonary:     Effort: Pulmonary effort is normal. No respiratory distress.     Breath sounds: Normal breath sounds. No wheezing or rales.  Abdominal:     General: Bowel sounds are normal. There is no distension.     Palpations: Abdomen is soft.     Tenderness: There is no abdominal tenderness. There is no rebound.  Musculoskeletal:        General: Tenderness present.     Comments: No true tenderness to palpation, feels deep but over bursal region  Skin:  General: Skin is warm and dry.  Neurological:     Mental Status: She is alert and oriented to person, place, and time.     Coordination: Coordination normal.     Vitals:   03/21/19 1340  BP: 130/90  Pulse: (!) 108  Temp: 98.3 F (36.8 C)  TempSrc: Oral  SpO2: 98%  Weight: 213 lb (96.6 kg)  Height: 5\' 5"  (1.651 m)    Assessment & Plan:

## 2019-03-21 NOTE — Patient Instructions (Signed)
We are checking the x-ray today to check for fracture or arthritis.   Hip Bursitis Rehab Ask your health care provider which exercises are safe for you. Do exercises exactly as told by your health care provider and adjust them as directed. It is normal to feel mild stretching, pulling, tightness, or discomfort as you do these exercises. Stop right away if you feel sudden pain or your pain gets worse. Do not begin these exercises until told by your health care provider. Stretching exercise This exercise warms up your muscles and joints and improves the movement and flexibility of your hip. This exercise also helps to relieve pain and stiffness. Iliotibial band stretch An iliotibial band is a strong band of muscle tissue that runs from the outer side of your hip to the outer side of your thigh and knee. 1. Lie on your side with your left / right leg in the top position. 2. Bend your left / right knee and grab your ankle. Stretch out your bottom arm to help you balance. 3. Slowly bring your knee back so your thigh is behind your body. 4. Slowly lower your knee toward the floor until you feel a gentle stretch on the outside of your left / right thigh. If you do not feel a stretch and your knee will not fall farther, place the heel of your other foot on top of your knee and pull your knee down toward the floor with your foot. 5. Hold this position for __________ seconds. 6. Slowly return to the starting position. Repeat __________ times. Complete this exercise __________ times a day. Strengthening exercises These exercises build strength and endurance in your hip and pelvis. Endurance is the ability to use your muscles for a long time, even after they get tired. Bridge This exercise strengthens the muscles that move your thigh backward (hip extensors). 1. Lie on your back on a firm surface with your knees bent and your feet flat on the floor. 2. Tighten your buttocks muscles and lift your buttocks off  the floor until your trunk is level with your thighs. ? Do not arch your back. ? You should feel the muscles working in your buttocks and the back of your thighs. If you do not feel these muscles, slide your feet 1-2 inches (2.5-5 cm) farther away from your buttocks. ? If this exercise is too easy, try doing it with your arms crossed over your chest. 3. Hold this position for __________ seconds. 4. Slowly lower your hips to the starting position. 5. Let your muscles relax completely after each repetition. Repeat __________ times. Complete this exercise __________ times a day. Squats This exercise strengthens the muscles in front of your thigh and knee (quadriceps). 1. Stand in front of a table, with your feet and knees pointing straight ahead. You may rest your hands on the table for balance but not for support. 2. Slowly bend your knees and lower your hips like you are going to sit in a chair. ? Keep your weight over your heels, not over your toes. ? Keep your lower legs upright so they are parallel with the table legs. ? Do not let your hips go lower than your knees. ? Do not bend lower than told by your health care provider. ? If your hip pain increases, do not bend as low. 3. Hold the squat position for __________ seconds. 4. Slowly push with your legs to return to standing. Do not use your hands to pull yourself to standing.  Repeat __________ times. Complete this exercise __________ times a day. Hip hike 1. Stand sideways on a bottom step. Stand on your left / right leg with your other foot unsupported next to the step. You can hold on to the railing or wall for balance if needed. 2. Keep your knees straight and your torso square. Then lift your left / right hip up toward the ceiling. 3. Hold this position for __________ seconds. 4. Slowly let your left / right hip lower toward the floor, past the starting position. Your foot should get closer to the floor. Do not lean or bend your  knees. Repeat __________ times. Complete this exercise __________ times a day. Single leg stand 1. Without shoes, stand near a railing or in a doorway. You may hold on to the railing or door frame as needed for balance. 2. Squeeze your left / right buttock muscles, then lift up your other foot. ? Do not let your left / right hip push out to the side. ? It is helpful to stand in front of a mirror for this exercise so you can watch your hip. 3. Hold this position for __________ seconds. Repeat __________ times. Complete this exercise __________ times a day. This information is not intended to replace advice given to you by your health care provider. Make sure you discuss any questions you have with your health care provider. Document Released: 06/18/2004 Document Revised: 09/05/2018 Document Reviewed: 09/05/2018 Elsevier Patient Education  2020 [employer].

## 2019-03-31 ENCOUNTER — Ambulatory Visit: Payer: Self-pay

## 2019-04-12 ENCOUNTER — Encounter (HOSPITAL_COMMUNITY): Payer: Self-pay | Admitting: Emergency Medicine

## 2019-04-12 ENCOUNTER — Emergency Department (HOSPITAL_COMMUNITY): Payer: HMO

## 2019-04-12 ENCOUNTER — Emergency Department (HOSPITAL_COMMUNITY)
Admission: EM | Admit: 2019-04-12 | Discharge: 2019-04-13 | Disposition: A | Discharge status: Home / Self Care | Payer: HMO | Attending: Emergency Medicine | Admitting: Emergency Medicine

## 2019-04-12 ENCOUNTER — Other Ambulatory Visit: Payer: Self-pay

## 2019-04-12 DIAGNOSIS — E119 Type 2 diabetes mellitus without complications: Secondary | ICD-10-CM | POA: Insufficient documentation

## 2019-04-12 DIAGNOSIS — I5042 Chronic combined systolic (congestive) and diastolic (congestive) heart failure: Secondary | ICD-10-CM | POA: Diagnosis not present

## 2019-04-12 DIAGNOSIS — R079 Chest pain, unspecified: Secondary | ICD-10-CM | POA: Diagnosis not present

## 2019-04-12 DIAGNOSIS — Z20828 Contact with and (suspected) exposure to other viral communicable diseases: Secondary | ICD-10-CM | POA: Insufficient documentation

## 2019-04-12 DIAGNOSIS — R072 Precordial pain: Secondary | ICD-10-CM | POA: Diagnosis not present

## 2019-04-12 DIAGNOSIS — J45909 Unspecified asthma, uncomplicated: Secondary | ICD-10-CM | POA: Insufficient documentation

## 2019-04-12 DIAGNOSIS — Z79899 Other long term (current) drug therapy: Secondary | ICD-10-CM | POA: Insufficient documentation

## 2019-04-12 DIAGNOSIS — I493 Ventricular premature depolarization: Secondary | ICD-10-CM | POA: Insufficient documentation

## 2019-04-12 DIAGNOSIS — I110 Hypertensive heart disease with heart failure: Secondary | ICD-10-CM | POA: Insufficient documentation

## 2019-04-12 LAB — BASIC METABOLIC PANEL
Anion gap: 8 (ref 5–15)
BUN: 8 mg/dL (ref 6–20)
CO2: 24 mmol/L (ref 22–32)
Calcium: 8.8 mg/dL — ABNORMAL LOW (ref 8.9–10.3)
Chloride: 105 mmol/L (ref 98–111)
Creatinine, Ser: 0.77 mg/dL (ref 0.44–1.00)
GFR calc Af Amer: >60 mL/min (ref >60)
GFR calc non Af Amer: >60 mL/min (ref >60)
Glucose, Bld: 104 mg/dL — ABNORMAL HIGH (ref 70–99)
Potassium: 3.2 mmol/L — ABNORMAL LOW (ref 3.5–5.1)
Sodium: 137 mmol/L (ref 135–145)

## 2019-04-12 LAB — CBC
HCT: 35.2 % — ABNORMAL LOW (ref 36.0–46.0)
Hemoglobin: 10.8 g/dL — ABNORMAL LOW (ref 12.0–15.0)
MCH: 25.5 pg — ABNORMAL LOW (ref 26.0–34.0)
MCHC: 30.7 g/dL (ref 30.0–36.0)
MCV: 83 fL (ref 80.0–100.0)
Platelets: 210 10*3/uL (ref 150–400)
RBC: 4.24 MIL/uL (ref 3.87–5.11)
RDW: 14.6 % (ref 11.5–15.5)
WBC: 8 10*3/uL (ref 4.0–10.5)
nRBC: 0 % (ref 0.0–0.2)

## 2019-04-12 LAB — I-STAT BETA HCG BLOOD, ED (MC, WL, AP ONLY): I-stat hCG, quantitative: <5 m[IU]/mL (ref <5)

## 2019-04-12 LAB — TROPONIN I (HIGH SENSITIVITY): Troponin I (High Sensitivity): 4 ng/L (ref <18)

## 2019-04-12 MED ORDER — NITROGLYCERIN 0.4 MG SL SUBL
0.4000 mg | SUBLINGUAL_TABLET | SUBLINGUAL | Status: DC | PRN
  Administered 2019-04-13 (×2): 0.4 mg via SUBLINGUAL
  Filled 2019-04-12: qty 1

## 2019-04-12 MED ORDER — SODIUM CHLORIDE 0.9% FLUSH: 3.0000 mL | Freq: Once | INTRAVENOUS | Status: DC

## 2019-04-12 MED ORDER — POTASSIUM CHLORIDE CRYS ER 20 MEQ PO TBCR
40.0000 meq | EXTENDED_RELEASE_TABLET | Freq: Once | ORAL | Status: AC
  Administered 2019-04-13: 40 meq via ORAL
  Filled 2019-04-12: qty 2

## 2019-04-12 NOTE — ED Provider Notes (Signed)
[facility] EMERGENCY DEPARTMENT Provider Note   CSN: [id number] Arrival date & time: 04/12/19  2254     History   Chief Complaint Chief Complaint  Patient presents with  . Chest Pain    HPI [name] is a 43 y.o. female.  HPI: A 43 year old patient with a history of treated diabetes and hypertension presents for evaluation of chest pain. Initial onset of pain was approximately 3-6 hours ago. The patient's chest pain is described as heaviness/pressure/tightness and is worse with exertion. The patient's chest pain is middle- or left-sided, is not well-localized, is not sharp and does not radiate to the arms/jaw/neck. The patient does not complain of nausea and denies diaphoresis. The patient has no history of stroke, has no history of peripheral artery disease, has not smoked in the past 90 days, has no relevant family history of coronary artery disease (first degree relative at less than age 42), has no history of hypercholesterolemia and does not have an elevated BMI (>=30).   HPI Patient with history of nonischemic cardiomyopathy with ICD in place presents with chest pain.  She reports squeezing chest pain for the past 5 hours.  It is worse with exertion.  She reports short of breath.  She had otherwise been well.  No fevers or cough.  No syncope. She has chronic dyspnea on exertion that is unchanged. She has not tried anything for her symptoms. Patient denies any ICD shocks Past Medical History:  Diagnosis Date  . Acute on chronic systolic (congestive) heart failure ([place]) 04/29/2017  . Acute pain of right shoulder 06/16/2017  . AICD (automatic cardioverter/defibrillator) present   . Anxiety   . Arthritis    right shoulder   . Asthma   . CHF (congestive heart failure) ([place])   . Chronic combined systolic and diastolic heart failure ([place]) 03/05/2014  . Closed low lateral malleolus fracture 10/23/2013  . Cystitis 10/21/2017  . Depression   . Depression with  anxiety 01/20/2013  . Diabetes mellitus without complication ([place])   . Diverticulosis   . Dyspnea    comes and goes intermittently mostly with exertion   . Essential hypertension    Prev followed by [facility]/ Cardiology   . Fibroid    age 65  . Gallstones   . Generalized abdominal cramping   . History of cardiomyopathy   . Hypertension   . IBS (irritable bowel syndrome)   . 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 Insomnia 05/07/2017  . Labral tear of shoulder 04/04/2015    Injected 04/04/2015 Injected 12/03/2015   . Migraine    "monthly" (08/03/2016)  . Myofascial pain 06/16/2017  . NICM (nonischemic cardiomyopathy) ([place]) 08/03/2016  . Nonallopathic lesion of lumbosacral region 11/16/2016  . Nonallopathic lesion of sacral region 11/16/2016  . Nonallopathic lesion of thoracic region 08/20/2014  . Nonspecific chest pain 04/28/2017  . OSA (obstructive sleep apnea) 01/02/2013   NPSG 2009:  AHI 9/hr. CPAP intolerance >> "smothering" Good tolerance of auto device (optimal pressure 12-13 on download).  - referred to Dr Gwenette Greet    . OSA on CPAP   . Ovarian cyst    1999; surgically removed  . Patellofemoral syndrome of both knees 10/16/2016  . 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 PVC (premature ventricular contraction) 06/23/2016  . Seizures ([place])    "as a child" (08/03/2016)  . Termination of pregnancy    due to cardiac risk    Patient Active  Problem List   Diagnosis Date Noted  . Left hip pain 03/21/2019  . Left lower quadrant abdominal pain 02/10/2019  . Headache 10/26/2018  . Left ankle pain 10/10/2018  . Lip swelling 09/12/2018  . Cough 07/27/2018  . Sore throat 07/21/2018  . Dysfunction of right eustachian tube 07/21/2018  . Whiplash injury syndrome, initial encounter 07/19/2018  . Chronic right-sided low back pain without sciatica 06/24/2018  . Trigger point of shoulder region, right 06/13/2018  . Patellar subluxation,  left, initial encounter 04/25/2018  . Left flank pain 01/14/2018  . Acute pain of right shoulder 06/16/2017  . Myofascial pain 06/16/2017  . Insomnia 05/07/2017  . History of cardiomyopathy   . Acute on chronic systolic (congestive) heart failure ([place]) 04/29/2017  . Nonspecific chest pain 04/28/2017  . 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 Nonallopathic lesion of lumbosacral region 11/16/2016  . Nonallopathic lesion of sacral region 11/16/2016  . Patellofemoral syndrome of both knees 10/16/2016  . NICM (nonischemic cardiomyopathy) ([place]) 08/03/2016  . PVC (premature ventricular contraction) 06/23/2016  . Generalized abdominal cramping   . Diabetes mellitus without complication ([place])   . Labral tear of shoulder 04/04/2015  . 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 Nonallopathic lesion of rib cage 08/20/2014  . Nonallopathic lesion of cervical region 08/20/2014  . Nonallopathic lesion of thoracic region 08/20/2014  . Chronic combined systolic and diastolic heart failure ([place]) 03/05/2014  . Closed low lateral malleolus fracture 10/23/2013  . IBS (irritable bowel syndrome)   . Depression with anxiety 01/20/2013  . OSA (obstructive sleep apnea) 01/02/2013  . 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 Essential hypertension     Past Surgical History:  Procedure Laterality Date  . CARDIAC CATHETERIZATION N/A 11/11/2015   Procedure: Right/Left Heart Cath and Coronary Angiography;  Surgeon: Troy Sine, MD;  Location: [place] CV LAB;  Service: Cardiovascular;  Laterality: N/A;  . CARDIAC CATHETERIZATION  ~ 2015  . CARDIAC DEFIBRILLATOR PLACEMENT  08/03/2016  . CESAREAN SECTION  1999  . COLONOSCOPY WITH PROPOFOL N/A 04/21/2016   Procedure: COLONOSCOPY WITH PROPOFOL;  Surgeon: Jerene Bears, MD;  Location: [facility];  Service: Gastroenterology;  Laterality: N/A;  . ESOPHAGOGASTRODUODENOSCOPY (EGD) WITH PROPOFOL N/A 04/21/2016   Procedure:  ESOPHAGOGASTRODUODENOSCOPY (EGD) WITH PROPOFOL;  Surgeon: Jerene Bears, MD;  Location: [facility];  Service: Gastroenterology;  Laterality: N/A;  . FOOT FRACTURE SURGERY Right ~ 2003  . FRACTURE SURGERY    . ICD IMPLANT N/A 08/03/2016   Procedure: ICD Implant;  Surgeon: Deboraha Sprang, MD;  Location: [place] CV LAB;  Service: Cardiovascular;  Laterality: N/A;  . LAPAROSCOPIC CHOLECYSTECTOMY  12/2006  . LAPAROSCOPIC GASTRIC SLEEVE RESECTION    . LAPAROSCOPY ABDOMEN DIAGNOSTIC  2008   "cut bile duct w/gallbladder OR; had to go in later & fix leak; hospitalized for 2 months"  . LEFT HEART CATHETERIZATION WITH CORONARY ANGIOGRAM N/A 02/26/2014   Procedure: LEFT HEART CATHETERIZATION WITH CORONARY ANGIOGRAM;  Surgeon: Jettie Booze, MD;  Location: [facility] CATH LAB;  Service: Cardiovascular;  Laterality: N/A;  . OVARIAN CYST REMOVAL Right 1999  . PVC ABLATION N/A 12/01/2017   Procedure: PVC ABLATION;  Surgeon: Evans Lance, MD;  Location: [place] CV LAB;  Service: Cardiovascular;  Laterality: N/A;  . RIGHT HEART CATH N/A 08/05/2017   Procedure: RIGHT HEART CATH;  Surgeon: Jolaine Artist, MD;  Location: [place] [facility] CV LAB;  Service: Cardiovascular;  Laterality: N/A;  . [place]  OB History    Gravida  2   Para  1   Term      Preterm      AB  1   Living  1     SAB      TAB  1   Ectopic      Multiple      Live Births               Home Medications    Prior to Admission medications   Medication Sig Start Date End Date Taking? Authorizing Provider  albuterol (PROAIR HFA) 108 (90 Base) MCG/ACT inhaler Inhale 1-2 puffs into the lungs every 6 (six) hours as needed for wheezing or shortness of breath. 02/10/19   Hoyt Koch, MD  amiodarone (PACERONE) 200 MG tablet Take 1 tablet by mouth once daily 02/09/19   Larey Dresser, MD  butalbital-acetaminophen-caffeine (FIORICET) [phone] MG tablet TAKE 1 TO 2 TABLETS BY MOUTH EVERY 6 HOURS AS  NEEDED FOR HEADACHE 12/05/18   Hoyt Koch, MD  colestipol (COLESTID) 1 g tablet Take 2 tablets (2 g total) by mouth 2 (two) times daily. 02/10/19   Hoyt Koch, MD  diclofenac sodium (VOLTAREN) 1 % GEL Apply 4 g topically 4 (four) times daily. 06/23/18   Hoyt Koch, MD  Diclofenac Sodium 2 % SOLN Place 2 g onto the skin 2 (two) times daily. 01/03/18   Lyndal Pulley, DO  dicyclomine (BENTYL) 20 MG tablet Take 20 mg by mouth 2 (two) times daily as needed for spasms.    [provider]  fluticasone (FLONASE) 50 MCG/ACT nasal spray Place 2 sprays into both nostrils daily as needed for allergies. 07/25/18   Hoyt Koch, MD  gabapentin (NEURONTIN) 100 MG capsule Take 200 mg by mouth at bedtime as needed (for pain).     [provider]  Ibuprofen-Famotidine 800-26.6 MG TABS Take 1 tablet by mouth 3 (three) times daily as needed. 01/17/18   Lyndal Pulley, DO  ivabradine (CORLANOR) 7.5 MG TABS tablet TAKE 1 TABLET (7.5 MG TOTAL) BY MOUTH 2 (TWO) TIMES DAILY WITH A MEAL. 02/15/19   Bensimhon, Shaune Pascal, MD  KLOR-CON M20 20 MEQ tablet TAKE 2 TABLETS (40 MEQ TOTAL) BY MOUTH DAILY. 06/23/18   Larey Dresser, MD  lidocaine (LIDODERM) 5 % Place 1 patch onto the skin daily. Remove & Discard patch within 12 hours or as directed by MD 01/17/18   Hoyt Koch, MD  linaclotide [facility]) 145 MCG CAPS capsule Take 1 capsule (145 mcg total) by mouth daily before breakfast. 10/19/17   Pyrtle, Lajuan Lines, MD  metroNIDAZOLE (FLAGYL) 500 MG tablet Take 1 tablet (500 mg total) by mouth 2 (two) times daily. 02/10/19   Hoyt Koch, MD  Multiple Vitamins-Minerals (MULTIVITAMIN GUMMIES WOMENS PO) Take 2 each by mouth daily.    [provider]  nitroGLYCERIN (NITROSTAT) 0.4 MG SL tablet Place 1 tablet (0.4 mg total) under the tongue every 5 (five) minutes as needed for chest pain. 07/27/18   Bensimhon, Shaune Pascal, MD  omeprazole (PRILOSEC) 20 MG capsule Take 20  mg by mouth daily. 09/09/17 09/04/18  [provider]  sacubitril-valsartan (ENTRESTO) 97-103 MG Take 1 tablet by mouth 2 (two) times daily. 06/27/18   Clegg, Amy D, NP  spironolactone (ALDACTONE) 25 MG tablet Take 1 tablet (25 mg total) by mouth daily. 02/15/19   Bensimhon, Shaune Pascal, MD  SYMBICORT 160-4.5 MCG/ACT inhaler INHALE 2 PUFFS  INTO THE LUNGS 2 (TWO) TIMES DAILY AS NEEDED. 05/03/18   Lyndal Pulley, DO  tiZANidine (ZANAFLEX) 4 MG tablet  12/02/18   [provider]  torsemide (DEMADEX) 20 MG tablet Take 2 tablets (40 mg total) by mouth 2 (two) times daily. 01/24/19   Bensimhon, Shaune Pascal, MD  Vitamin D, Ergocalciferol, (DRISDOL) 1.25 MG (50000 UT) CAPS capsule Take 1 capsule (50,000 Units total) by mouth every 7 (seven) days. 08/01/18   Lyndal Pulley, DO  zolpidem (AMBIEN) 5 MG tablet Take 1 tablet (5 mg total) by mouth at bedtime as needed for sleep. 05/10/17   Hoyt Koch, MD    Family History Family History  Problem Relation Age of Onset  . Emphysema Maternal Grandmother        smoked  . Heart disease Maternal Grandmother 49       MI  . Rheum arthritis Mother   . Allergies Daughter   . Colon cancer Neg Hx     Social History Social History   Tobacco Use  . Smoking status: Never Smoker  . Smokeless tobacco: Never Used  Substance Use Topics  . Alcohol use: No  . Drug use: No     Allergies   Aspirin, Vancomycin, Contrast media [iodinated diagnostic agents], Avocado, Ciprofloxacin, and Sulfa antibiotics   Review of Systems Review of Systems  Constitutional: Negative for fever.  Respiratory: Positive for shortness of breath. Negative for cough.   Cardiovascular: Positive for chest pain. Negative for leg swelling.  Neurological: Negative for syncope.  Psychiatric/Behavioral: The patient is nervous/anxious.   All other systems reviewed and are negative.    Physical Exam Updated Vital Signs BP 140/77 (BP Location: Right Arm)   Pulse (!) 104    Temp 98.7 F (37.1 C) (Oral)   Resp 16   Ht 1.664 m (5' 5.5")   Wt 93.4 kg   LMP 03/26/2019   SpO2 97%   BMI 33.76 kg/m   Physical Exam CONSTITUTIONAL: Well developed/well nourished, anxious HEAD: Normocephalic/atraumatic EYES: EOMI/PERRL ENMT: Mucous membranes moist NECK: supple no meningeal signs SPINE/BACK:entire spine nontender CV: Irregular, no loud murmur LUNGS: Lungs are clear to auscultation bilaterally, no apparent distress ABDOMEN: soft, nontender, no rebound or guarding, bowel sounds noted throughout abdomen GU:no cva tenderness NEURO: Pt is awake/alert/appropriate, moves all extremitiesx4.  No facial droop.   EXTREMITIES: pulses normal/equal, full ROM, no significant lower extremity edema SKIN: warm, color normal PSYCH: Anxious   ED Treatments / Results  Labs (all labs ordered are listed, but only abnormal results are displayed) Labs Reviewed  BASIC METABOLIC PANEL - Abnormal; Notable for the following components:      Result Value   Potassium 3.2 (*)    Glucose, Bld 104 (*)    Calcium 8.8 (*)    All other components within normal limits  CBC - Abnormal; Notable for the following components:   Hemoglobin 10.8 (*)    HCT 35.2 (*)    MCH 25.5 (*)    All other components within normal limits  SARS CORONAVIRUS 2 (TAT 6-24 HRS)  I-STAT BETA HCG BLOOD, ED ([facility], [facility], [facility] ONLY)  TROPONIN I (HIGH SENSITIVITY)  TROPONIN I (HIGH SENSITIVITY)    EKG EKG Interpretation  Date/Time:  Wednesday April 12 2019 22:59:29 EST Ventricular Rate:  104 PR Interval:  196 QRS Duration: 84 QT Interval:  360 QTC Calculation: 473 R Axis:   -61 Text Interpretation: Sinus tachycardia with frequent and consecutive Premature ventricular complexes Left anterior fascicular  block Septal infarct , age undetermined Abnormal ECG Confirmed by Ripley Fraise [id number]) on 04/12/2019 11:45:06 PM   Radiology Dg Chest 2 View  Result Date: 04/12/2019 CLINICAL DATA:  Chest pain EXAM:  CHEST - 2 VIEW COMPARISON:  05/27/2018 FINDINGS: Left-sided pacing device as before. No focal opacity or pleural effusion. Stable cardiomediastinal silhouette. No pneumothorax. IMPRESSION: No active cardiopulmonary disease. Electronically Signed   By: Donavan Foil M.D.   On: 04/12/2019 23:32    Procedures Procedures   Medications Ordered in ED Medications  sodium chloride flush (NS) 0.9 % injection 3 mL (has no administration in time range)  nitroGLYCERIN (NITROSTAT) SL tablet 0.4 mg (0.4 mg Sublingual Given 04/13/19 0039)  potassium chloride SA (KLOR-CON) CR tablet 40 mEq (40 mEq Oral Given 04/13/19 0031)     Initial Impression / Assessment and Plan / ED Course  I have reviewed the triage vital signs and the nursing notes.  Pertinent labs & imaging results that were available during my care of the patient were reviewed by me and considered in my medical decision making (see chart for details).     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  Cardiac cath in 2017 revealed normal coronaries, likely nonischemic cardiomyopathy Will obtain labs and interrogate ICD 1:47 AM Overall patient feels improved.  Preliminary report from ICD interrogation is that it looked good without any issues. Repeat troponin is pending 2:26 AM Repeat troponin unremarkable.  Patient feels improved.  She reports previous episodes before that responded as well nitroglycerin.  She has had normal coronaries on previous cardiac cath.  No pleuritic pain or hypoxia to suggest acute PE. Overall it appears that her cardiomyopathy is well controlled.  She will be discharged home with cardiology follow-up. She does have frequent PVCs that could be triggering these episodes We discussed return precautions Final Clinical Impressions(s) / ED Diagnoses   Final diagnoses:  Precordial pain  PVC's (premature ventricular contractions)    ED Discharge  Orders    None       Ripley Fraise, MD 04/13/19 [phone]

## 2019-04-12 NOTE — ED Triage Notes (Signed)
Pt reports squeezing chest pain on the left to middle side.  Denies n/v/dizziness.  Also reports weakness/tiredness and loss of appetite.

## 2019-04-13 ENCOUNTER — Other Ambulatory Visit: Payer: Self-pay

## 2019-04-13 LAB — SARS CORONAVIRUS 2 (TAT 6-24 HRS): SARS Coronavirus 2: NEGATIVE

## 2019-04-13 LAB — TROPONIN I (HIGH SENSITIVITY): Troponin I (High Sensitivity): 5 ng/L (ref <18)

## 2019-04-13 NOTE — ED Notes (Signed)
[name] from [facility] called to report that "the leads look good and no episodes."  He will fax report.  Provider aware.

## 2019-04-13 NOTE — Discharge Instructions (Addendum)

## 2019-04-13 NOTE — Patient Outreach (Signed)
  [facility]) Care Management Chronic Special Needs Program  04/13/2019  Name: [name] DOB: November 04, 1975  MRN: [id number]  [name] is enrolled in a Chronic Special Needs Plan. RNCM called to follow up and review individualized care plan. No answer. HIPPA compliant message left.   Plan: Chronic care management coordinator will attempt outreach within 2-3 weeks.   Thea Silversmith, RN, MSN, [place] [phone]

## 2019-04-13 NOTE — ED Notes (Signed)
Pt's pulse ox 100% while ambulating. No complaints.

## 2019-04-13 NOTE — ED Notes (Signed)
Pacemaker interrogated per provider request.

## 2019-04-18 ENCOUNTER — Telehealth (HOSPITAL_COMMUNITY): Payer: Self-pay

## 2019-04-18 NOTE — Telephone Encounter (Signed)
Attempted to contact Patient for Pre Covid Screening. No answer and sent to Voicemail.

## 2019-04-19 ENCOUNTER — Other Ambulatory Visit: Payer: Self-pay

## 2019-04-19 ENCOUNTER — Encounter (HOSPITAL_COMMUNITY): Payer: Self-pay

## 2019-04-19 ENCOUNTER — Ambulatory Visit (HOSPITAL_COMMUNITY)
Admission: RE | Admit: 2019-04-19 | Discharge: 2019-04-19 | Disposition: A | Discharge status: Home / Self Care | Payer: HMO | Source: Ambulatory Visit | Attending: Cardiology | Admitting: Cardiology

## 2019-04-19 VITALS — BP 118/83 | HR 93 | Wt 214.4 lb

## 2019-04-19 DIAGNOSIS — Z6835 Body mass index (BMI) 35.0-35.9, adult: Secondary | ICD-10-CM | POA: Diagnosis not present

## 2019-04-19 DIAGNOSIS — I472 Ventricular tachycardia: Secondary | ICD-10-CM | POA: Insufficient documentation

## 2019-04-19 DIAGNOSIS — Z79899 Other long term (current) drug therapy: Secondary | ICD-10-CM | POA: Insufficient documentation

## 2019-04-19 DIAGNOSIS — I429 Cardiomyopathy, unspecified: Secondary | ICD-10-CM | POA: Diagnosis not present

## 2019-04-19 DIAGNOSIS — M1990 Unspecified osteoarthritis, unspecified site: Secondary | ICD-10-CM | POA: Diagnosis not present

## 2019-04-19 DIAGNOSIS — I5042 Chronic combined systolic (congestive) and diastolic (congestive) heart failure: Secondary | ICD-10-CM | POA: Insufficient documentation

## 2019-04-19 DIAGNOSIS — I110 Hypertensive heart disease with heart failure: Secondary | ICD-10-CM | POA: Insufficient documentation

## 2019-04-19 DIAGNOSIS — I493 Ventricular premature depolarization: Secondary | ICD-10-CM | POA: Diagnosis not present

## 2019-04-19 DIAGNOSIS — G4733 Obstructive sleep apnea (adult) (pediatric): Secondary | ICD-10-CM | POA: Diagnosis not present

## 2019-04-19 DIAGNOSIS — E119 Type 2 diabetes mellitus without complications: Secondary | ICD-10-CM | POA: Diagnosis not present

## 2019-04-19 DIAGNOSIS — K589 Irritable bowel syndrome without diarrhea: Secondary | ICD-10-CM | POA: Insufficient documentation

## 2019-04-19 DIAGNOSIS — Z9884 Bariatric surgery status: Secondary | ICD-10-CM | POA: Insufficient documentation

## 2019-04-19 DIAGNOSIS — J45909 Unspecified asthma, uncomplicated: Secondary | ICD-10-CM | POA: Diagnosis not present

## 2019-04-19 DIAGNOSIS — Z95810 Presence of automatic (implantable) cardiac defibrillator: Secondary | ICD-10-CM | POA: Diagnosis not present

## 2019-04-19 DIAGNOSIS — R008 Other abnormalities of heart beat: Secondary | ICD-10-CM | POA: Diagnosis not present

## 2019-04-19 LAB — COMPREHENSIVE METABOLIC PANEL
ALT: 11 U/L (ref 0–44)
AST: 16 U/L (ref 15–41)
Albumin: 3.5 g/dL (ref 3.5–5.0)
Alkaline Phosphatase: 78 U/L (ref 38–126)
Anion gap: 8 (ref 5–15)
BUN: 6 mg/dL (ref 6–20)
CO2: 25 mmol/L (ref 22–32)
Calcium: 8.8 mg/dL — ABNORMAL LOW (ref 8.9–10.3)
Chloride: 108 mmol/L (ref 98–111)
Creatinine, Ser: 0.8 mg/dL (ref 0.44–1.00)
GFR calc Af Amer: >60 mL/min (ref >60)
GFR calc non Af Amer: >60 mL/min (ref >60)
Glucose, Bld: 95 mg/dL (ref 70–99)
Potassium: 3.4 mmol/L — ABNORMAL LOW (ref 3.5–5.1)
Sodium: 141 mmol/L (ref 135–145)
Total Bilirubin: 0.6 mg/dL (ref 0.3–1.2)
Total Protein: 6.7 g/dL (ref 6.5–8.1)

## 2019-04-19 LAB — TSH: TSH: 2.387 u[IU]/mL (ref 0.350–4.500)

## 2019-04-19 LAB — MAGNESIUM: Magnesium: 2.1 mg/dL (ref 1.7–2.4)

## 2019-04-19 LAB — T4, FREE: Free T4: 1.09 ng/dL (ref 0.61–1.12)

## 2019-04-19 NOTE — Progress Notes (Signed)
Advanced Heart Failure Clinic Note Date:  04/19/2019  ID:  [name], DOB 1976/01/08, MRN [id number]  PCP:  Hoyt Koch, MD  Cardiologist: Pernell Dupre, MD HF: Dr [name] EP: Dr. Andre Lefort [name] is a 43 y.o. 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   Was admitted 12/5-11/2016 with recurrent HF, URI, CP and elevated BP (160/110). CT chest no PE. Diuresed from 233-> 228 pounds.   Admitted 11/2017 with CP described as "pounding". Troponins were negative. Symptoms thought to be related to frequent PVCs. EP consulted and scheduled her for PVC ablation. Echo repeated 11/22/17 and showed improved EF 50-55%. HF team consulted and changed torsemide to 40 mg daily (from 3x/week).   Underwent PVC ablation 12/01/17. Was felt not to be successful.  Was on flecainide but discontinued due to reduced EF. Now on amiodarone.   Repeat Echo 10/2018 showed drop in EF back down to 25-30%. Was instructed to f/u in clinic but pt did not. She had outpatient monitor 04/2018 that showed rare PVCs.    She recently presented to the ED 11/18 w/ CC of chest pain. According to EDP documentation, pain had been present for 3-6 hrs prior to presenting to the ED. Described as central-left sided heaviness/pressure/tightness that was worse with exertion. No pleuritic pain or hypoxia to suggest acute PE. Hs Troponin normal x 2 (4>>5). EKG showed sinus tach 104 w/ frequent PVCs. Device interrogation was unremarkable. She was released from the ED and instructed to f/u in clinic.   Today in clinic, her HeartLogic score is 4. Feels tired. Had dyspnea w/ moderate activity but no resting dyspnea. Denies any recurrent CP. Reports full med compliance.  EKG today shows frequent PVCs, in a pattern of bigeminy. BP stable. Denies syncope/near syncope. Occasionally will have a cup of coffee but no caffeine in excess. No ETOH.    Cardiac Studies  Echo done 05/19/2018 by Dr [name] --. EF back down to 20-25% - Bedside echo by Dr. Haroldine [name] with EF improved to 50%. Echo 6/20 - EF back down at 25-30%.   Cardiac Testing.  Cath 6/17 revealed normal vessels, moderate elevation in pulmonary artery pressures, and LVEF 25-30%.  [place] 3/19: RA = 2 RV = 26/7 PA = 28/13 (19) PCW = 5 Fick cardiac output/index = 5.7/2.8 PVR = 2.5 WU Ao sat = 99% PA sat = 71%, 72% High SVC sat = 65% RHC 6/17 RA 5 RV 35/6 PA 41/12 PW 11 Fick 6.4/3.0 PA sat 62%  Echo 6/15 35-40% Echo 6/17 25-30% Echo 12/17 20-25% Echo 8/18 EF 25% Echo 7/19: EF 50-55% Echo 6/20: EF 25-30%  CPX 2/18 FVC 2.57 (79%)    FEV1 2.14 (81%)     FEV1/FVC 83 (101%)     MVV 107 (102%) Resting HR: 106 Peak HR: 166  (93% age predicted max HR) BP rest: 136/98 BP peak: 174/88 Peak VO2: 17.1 (85% predicted peak VO2) - corrects to 28.4 for ibw VE/VCO2 slope: 33 OUES: 2.16 Peak RER: 1.09 Ventilatory Threshold: 14.6 (73% predicted or measured peak VO2) VE/MVV: 59% PETCO2 at peak: 33 O2pulse: 10  (83% predicted O2pulse)  CPX 12/18 pVO2 14.0 (corrects to 23.0 for IBW) VeVCO2  32 RER 1.0  Review of systems  complete and found to be negative unless listed in HPI.    Past Medical History:  Diagnosis Date  . Acute on chronic systolic (congestive) heart failure ([place]) 04/29/2017  . Acute pain of right shoulder 06/16/2017  . AICD (automatic cardioverter/defibrillator) present   . Anxiety   . Arthritis    right shoulder   . Asthma   . CHF (congestive heart failure) ([place])   . Chronic combined systolic and diastolic heart failure ([place]) 03/05/2014  . Closed low lateral malleolus fracture 10/23/2013  . Cystitis 10/21/2017  . Depression   . Depression with anxiety 01/20/2013   . Diabetes mellitus without complication ([place])   . Diverticulosis   . Dyspnea    comes and goes intermittently mostly with exertion   . Essential hypertension    Prev followed by [facility] Cardiology   . Fibroid    age 5  . Gallstones   . Generalized abdominal cramping   . History of cardiomyopathy   . Hypertension   . IBS (irritable bowel syndrome)   . 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 Insomnia 05/07/2017  . Labral tear of shoulder 04/04/2015    Injected 04/04/2015 Injected 12/03/2015   . Migraine    "monthly" (08/03/2016)  . Myofascial pain 06/16/2017  . NICM (nonischemic cardiomyopathy) ([place]) 08/03/2016  . Nonallopathic lesion of lumbosacral region 11/16/2016  . Nonallopathic lesion of sacral region 11/16/2016  . Nonallopathic lesion of thoracic region 08/20/2014  . Nonspecific chest pain 04/28/2017  . OSA (obstructive sleep apnea) 01/02/2013   NPSG 2009:  AHI 9/hr. CPAP intolerance >> "smothering" Good tolerance of auto device (optimal pressure 12-13 on download).  - referred to Dr Gwenette Greet    . OSA on CPAP   . Ovarian cyst    1999; surgically removed  . Patellofemoral syndrome of both knees 10/16/2016  . 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 PVC (premature ventricular contraction) 06/23/2016  . Seizures ([place])    "as a child" (08/03/2016)  . Termination of pregnancy    due to cardiac risk    Past Surgical History:  Procedure Laterality Date  . CARDIAC CATHETERIZATION N/A 11/11/2015   Procedure: Right/Left Heart Cath and Coronary Angiography;  Surgeon: Troy Sine, MD;  Location: [place] CV LAB;  Service: Cardiovascular;  Laterality: N/A;  . CARDIAC CATHETERIZATION  ~ 2015  . CARDIAC DEFIBRILLATOR PLACEMENT  08/03/2016  . CESAREAN SECTION  1999  . COLONOSCOPY WITH PROPOFOL N/A 04/21/2016   Procedure: COLONOSCOPY WITH PROPOFOL;  Surgeon: Jerene Bears, MD;  Location: [facility];  Service: Gastroenterology;   Laterality: N/A;  . ESOPHAGOGASTRODUODENOSCOPY (EGD) WITH PROPOFOL N/A 04/21/2016   Procedure: ESOPHAGOGASTRODUODENOSCOPY (EGD) WITH PROPOFOL;  Surgeon: Jerene Bears, MD;  Location: [facility];  Service: Gastroenterology;  Laterality: N/A;  . FOOT FRACTURE SURGERY Right ~ 2003  . FRACTURE SURGERY    . ICD IMPLANT N/A 08/03/2016   Procedure: ICD Implant;  Surgeon: Deboraha Sprang, MD;  Location: [place] CV LAB;  Service: Cardiovascular;  Laterality: N/A;  . LAPAROSCOPIC CHOLECYSTECTOMY  12/2006  . LAPAROSCOPIC GASTRIC SLEEVE RESECTION    . LAPAROSCOPY ABDOMEN DIAGNOSTIC  2008   "cut bile duct w/gallbladder OR; had to go in later & fix leak; hospitalized for 2 months"  . LEFT HEART CATHETERIZATION WITH CORONARY ANGIOGRAM N/A 02/26/2014   Procedure: LEFT HEART CATHETERIZATION WITH CORONARY ANGIOGRAM;  Surgeon: Jettie Booze, MD;  Location: [facility] CATH LAB;  Service: Cardiovascular;  Laterality:  N/A;  . OVARIAN CYST REMOVAL Right 1999  . PVC ABLATION N/A 12/01/2017   Procedure: PVC ABLATION;  Surgeon: Evans Lance, MD;  Location: [place] CV LAB;  Service: Cardiovascular;  Laterality: N/A;  . RIGHT HEART CATH N/A 08/05/2017   Procedure: RIGHT HEART CATH;  Surgeon: Jolaine Artist, MD;  Location: [place] CV LAB;  Service: Cardiovascular;  Laterality: N/A;  . TUBAL LIGATION  1999    Current Medications:  Current Meds  Medication Sig  . albuterol (PROAIR HFA) 108 (90 Base) MCG/ACT inhaler Inhale 1-2 puffs into the lungs every 6 (six) hours as needed for wheezing or shortness of breath.  Marland Kitchen amiodarone (PACERONE) 200 MG tablet Take 1 tablet by mouth once daily  . butalbital-acetaminophen-caffeine (FIORICET) 50-325-40 MG tablet TAKE 1 TO 2 TABLETS BY MOUTH EVERY 6 HOURS AS NEEDED FOR HEADACHE  . colestipol (COLESTID) 1 g tablet Take 2 tablets (2 g total) by mouth 2 (two) times daily.  . diclofenac sodium (VOLTAREN) 1 % GEL Apply 4 g topically 4 (four) times daily.  . Diclofenac  Sodium 2 % SOLN Place 2 g onto the skin 2 (two) times daily.  Marland Kitchen dicyclomine (BENTYL) 20 MG tablet Take 20 mg by mouth 2 (two) times daily as needed for spasms.  . fluticasone (FLONASE) 50 MCG/ACT nasal spray Place 2 sprays into both nostrils daily as needed for allergies.  Marland Kitchen gabapentin (NEURONTIN) 100 MG capsule Take 200 mg by mouth at bedtime as needed (for pain).   . Ibuprofen-Famotidine 800-26.6 MG TABS Take 1 tablet by mouth 3 (three) times daily as needed.  . ivabradine (CORLANOR) 7.5 MG TABS tablet TAKE 1 TABLET (7.5 MG TOTAL) BY MOUTH 2 (TWO) TIMES DAILY WITH A MEAL.  Marland Kitchen KLOR-CON M20 20 MEQ tablet TAKE 2 TABLETS (40 MEQ TOTAL) BY MOUTH DAILY.  Marland Kitchen lidocaine (LIDODERM) 5 % Place 1 patch onto the skin daily. Remove & Discard patch within 12 hours or as directed by MD  . linaclotide (LINZESS) 145 MCG CAPS capsule Take 1 capsule (145 mcg total) by mouth daily before breakfast.  . Multiple Vitamins-Minerals (MULTIVITAMIN GUMMIES WOMENS PO) Take 2 tablets by mouth daily.   . nitroGLYCERIN (NITROSTAT) 0.4 MG SL tablet Place 1 tablet (0.4 mg total) under the tongue every 5 (five) minutes as needed for chest pain.  Marland Kitchen omeprazole (PRILOSEC) 20 MG capsule Take 20 mg by mouth daily as needed (for reflux symptoms).   . sacubitril-valsartan (ENTRESTO) 97-103 MG Take 1 tablet by mouth 2 (two) times daily.  Marland Kitchen spironolactone (ALDACTONE) 25 MG tablet Take 1 tablet (25 mg total) by mouth daily.  . SYMBICORT 160-4.5 MCG/ACT inhaler INHALE 2 PUFFS INTO THE LUNGS 2 (TWO) TIMES DAILY AS NEEDED.  Marland Kitchen tiZANidine (ZANAFLEX) 4 MG tablet Take 4 mg by mouth every 8 (eight) hours as needed for muscle spasms.   Marland Kitchen torsemide (DEMADEX) 20 MG tablet Take 2 tablets (40 mg total) by mouth 2 (two) times daily.  . Vitamin D, Ergocalciferol, (DRISDOL) 1.25 MG (50000 UT) CAPS capsule Take 1 capsule (50,000 Units total) by mouth every 7 (seven) days.  Marland Kitchen zolpidem (AMBIEN) 5 MG tablet Take 1 tablet (5 mg total) by mouth at bedtime as needed  for sleep.   Allergies:   Aspirin, Vancomycin, Contrast media [iodinated diagnostic agents], Avocado, Ciprofloxacin, and Sulfa antibiotics   Social History   Socioeconomic History  . Marital status: Married    Spouse name: Not on file  . Number of children: 1  .  Years of education: Not on file  . Highest education level: Not on file  Occupational History  . Occupation: stay at home mom  Social Needs  . Financial resource strain: Not on file  . Food insecurity    Worry: Not on file    Inability: Not on file  . Transportation needs    Medical: Not on file    Non-medical: Not on file  Tobacco Use  . Smoking status: Never Smoker  . Smokeless tobacco: Never Used  Substance and Sexual Activity  . Alcohol use: No  . Drug use: No  . Sexual activity: Yes  Lifestyle  . Physical activity    Days per week: Not on file    Minutes per session: Not on file  . Stress: Not on file  Relationships  . Social Herbalist on phone: Not on file    Gets together: Not on file    Attends religious service: Not on file    Active member of club or organization: Not on file    Attends meetings of clubs or organizations: Not on file    Relationship status: Not on file  Other Topics Concern  . Not on file  Social History Narrative  . Not on file    Family History:  The patient's family history includes Allergies in her daughter; Emphysema in her maternal grandmother; Heart disease (age of onset: 50) in her maternal grandmother; Rheum arthritis in her mother.   Vitals:   04/19/19 1014  BP: 118/83  Pulse: 93  SpO2: 99%  Weight: 97.3 kg (214 lb 6.4 oz)    Wt Readings from Last 3 Encounters:  04/19/19 97.3 kg (214 lb 6.4 oz)  04/12/19 93.4 kg (206 lb)  03/21/19 96.6 kg (213 lb)    Physical Exam PHYSICAL EXAM: General:  Well appearing Moderately obese young female. No respiratory difficulty HEENT: normal Neck: supple. no JVD. Carotids 2+ bilat; no bruits. No lymphadenopathy or  thyromegaly appreciated. Cor: PMI nondisplaced. Irregular rhythm (PVCs), regular rate. No rubs, gallops or murmurs. Lungs: clear Abdomen: soft, nontender, nondistended. No hepatosplenomegaly. No bruits or masses. Good bowel sounds. Extremities: no cyanosis, clubbing, rash, edema Neuro: alert & oriented x 3, cranial nerves grossly intact. moves all 4 extremities w/o difficulty. Affect pleasant.   Assessment and Plan  1. Chronic systolic HF:  due to peri-partum CM, onset 1999. S/P [employer] ICD 07/2016 - EF 20-25% (12/17).  - Echo 8/18 EF 25-30% s/p ICD 3/18. [place] 3/19: RA 2, PA 28/13, Fick CO 5.7, CI 2.8 - CPX 12/18 showed moderate limitation due mostly to obesity with some HF component - Echo 11/22/17 LVEF 50-55%, Grade 1 DD, Mild MR, Mod LAE - Echo 04/2018 by Dr [name] EF20-25%. RV ok - Bedside echo by Dr. Haroldine [name] with EF improved to 50%. - Echo 10/2018 showed drop in EF back down to 25-30%.  - EKG today shows frequent PVCs. Given drop in EF again and frequent PVCs, will order 48 hr Zio to assess PVC burden, as this could contribute to CM. May need repeat ablation attempt.  - Volume stable. HeartLogic Score of 4. NYHA class II.  - Continue torsemide 40 mg daily.   - Continue Entresto 97-103 mg BID - Off bb in 04/2018 due to suspected low output.  - Continue Spiro 25 mg daily.  - Continue corlanor 7.5mg  BID for now.  - BMET today.  2. Morbid obesity - s/p Gastric Sleeve at [facility] 09/20/17  -  Body mass index is 35.14 kg/m.  - She remains active. Has done CR multiple times.   3. OSA - She has been able to stop CPAP per sleep study in June 2019.  4. Anxiety/depression - Per PCP. No change.   5. DM2 - A1c in Jan. 2018 is 5.3 with medications.  - Per PCP. Consider addition of Jardiance in the near future.   6. Frequent PVCs/ Bigeminy - Holter Monitor 11/09/17 with 30% PVCs with one primary morphology.  - S/p PVC ablation 11/2017 with Dr Lovena Le unsuccessful - Off  flecainide with EF down and persistent PVCs. Changed to [facility] Patch [id number] w/ One short run NSVT with occasional PVCs - now with frequent PVCs. Will order 48 hr holter (Zio) to calculate PVC burden. Will refer back to EP after monitor for further recs. May need repeat ablation attempt vs up titration of amio.  - Continue amio 200 mg daily for now.  - Check TFTs, HFTs, BMP and Mg  - needs annual eye exams  - avoid caffeine and other stimulants.   F/u with Dr. Lovena Le or APP in [place] [facility] in 2 weeks and again in [facility] in 3-4 weeks.   Lyda Jester, PA-C  04/19/2019 10:20 AM

## 2019-04-19 NOTE — Patient Instructions (Signed)
Routine lab work today. Will notify you of abnormal results  Your provider requests you wear a zio patch for 3 days.  Follow up in 3-4 weeks.  Follow up with Dr.Taylor in 2 weeks.

## 2019-04-20 LAB — T3, FREE: T3, Free: 3.2 pg/mL (ref 2.0–4.4)

## 2019-04-24 ENCOUNTER — Ambulatory Visit (INDEPENDENT_AMBULATORY_CARE_PROVIDER_SITE_OTHER): Payer: HMO

## 2019-04-24 DIAGNOSIS — Z95810 Presence of automatic (implantable) cardiac defibrillator: Secondary | ICD-10-CM | POA: Diagnosis not present

## 2019-04-24 DIAGNOSIS — I5042 Chronic combined systolic (congestive) and diastolic (congestive) heart failure: Secondary | ICD-10-CM | POA: Diagnosis not present

## 2019-04-26 ENCOUNTER — Ambulatory Visit: Payer: HMO | Admitting: Family Medicine

## 2019-04-26 NOTE — Progress Notes (Signed)
EPIC Encounter for ICM Monitoring  Patient Name: [name] is a 43 y.o. female Date: 04/26/2019 Primary Care Physican: Hoyt Koch, MD Primary Cardiologist: Bensimhon Electrophysiologist: Caryl Comes 04/19/2019 Weight: 214 lbs (office weight)  Transmission reviewed and results sent via mychart.  HeartLogic Heart Failure Index: 04/23/2019 Index 15 - suggesting within normal threshold  Prescribed:Torsemide20 mgTake 2 tablets (40 mg total) by mouthtwice a day. Potassium 20 mEq take 2 tablets(40 mEq total)daily. Can take extra Torsemide as needed and Dr Haroldine Laws has discussed sliding scale.  Labs: 04/19/2019 Creatinine 0.80, BUN 6,   Potassium 3.4, Sodium 141, GFR >60 04/12/2019 Creatinine 0.77, BUN 8,   Potassium 3.2, Sodium 137, GFR >60 02/10/2019 Creatinine 0.79, BUN 8, Potassium 3.3, Sodium 139, GFR 95.29 09/28/2018 Creatinine 0.90, BUN 11, Potassium 3.1, Sodium 140, GFR >60 01/02/2019 Creatinine0.98, BUN10, Potassium3.4, Sodium141, GFR>60 06/06/2018 Creatinine0.93, BUN7, Potassium 3.7, Sodium138, GFR>60  05/27/2018 Creatinine1.02, BUN8, Potassium 3.5, Sodium137, GFR>60 A complete set of results can be found in Results Review  Recommendations: No changes.          Follow-up plan: ICM clinic phone appointment on 05/29/2019.   91 day device clinic remote transmission 06/20/2019.  Office appt 05/05/2019 with Barrington Ellison, PA.          Copy of ICM check sent to Dr. Caryl Comes.            [name], RN 04/26/2019 8:55 AM

## 2019-04-28 ENCOUNTER — Telehealth (HOSPITAL_COMMUNITY): Payer: Self-pay

## 2019-04-28 MED ORDER — POTASSIUM CHLORIDE ER 10 MEQ PO TBCR: 50.0000 meq | EXTENDED_RELEASE_TABLET | Freq: Every day | ORAL | 3 refills | Status: DC

## 2019-04-28 NOTE — Telephone Encounter (Signed)
-----   Message from Consuelo Pandy, [place] sent at 04/19/2019 12:35 PM EST ----- Labs stable but K mildly low. Add 10 mEq of Kdur to regimen daily and repeat BMP 1 week after.

## 2019-05-01 NOTE — Telephone Encounter (Signed)
Late entry (see lab results 04/28/19): Pt aware of lab results and med changes.  Phone cut off as appt was being made. Lm for patient to return call to confirm.

## 2019-05-02 NOTE — Telephone Encounter (Signed)
Called patient, went straight to vm.  LM asking patient to return call confirming she received lab appt date and time.

## 2019-05-04 ENCOUNTER — Other Ambulatory Visit: Payer: Self-pay

## 2019-05-04 NOTE — Patient Outreach (Signed)
[facility]) Care Management Chronic Special Needs Program  05/04/2019  Name: CALIOPE VICENCIO DOB: 11-19-1975  MRN: [id number]  Ms. Apirl Arzuaga is enrolled in a chronic special needs plan for Heart Failure. Reviewed and updated care plan.  Subjective: client reports she is having a good day. Client states she is able to obtain medications without difficulty and has transportation to her provider visits. She reports she weighs self daily, but did not weigh self today. She states weight yesterday was 196 pounds. She reports she has been having an issue with PVC's and has been on a heart monitor. Client has an appointment with cardiologist tomorrow for follow up.   Client reports she knows what is normal for her so she can tell when her body is holding onto fluid; as well as monitoring weights, monitoring for increased edema, fatigue and increased shortness of breath. Client is also being monitored by a home remote transmission device.  Client states her fracture is healed and she states she is not having any issues at this time. Client reports she has had several bone scans at [facility] in the past. Per client, primary care aware. She states she likes to walk and sometimes goes to the pool. "I try to stay as active as I can". She denies any questions or concerns at this time.   Client reports she does not have a blood pressure monitor.  Goals Addressed            This Visit's Progress   .  Acknowledge receipt of Advanced Directive package   On track   . Advanced Care Planning complete as directed by client..   On track   . COMPLETED: Client understands the importance of follow-up with providers by attending scheduled visits       Voiced importance of attending provider visits.    . COMPLETED: Client will report no fall or injuries in the next 3 months.       Denies any more falls.    . Client will report no worsening of symptoms related to heart disease within the  next 3 months    On track   . COMPLETED: Client will verbalize knowledge of Heart Failure disease self management skills within the next 3-6 months.       Voiced taking medications as prescribed; monitors weights; attends provider visits as scheduled; monitor for increased edema; knows when to call the doctor and who to call.    . Client will verbalize knowledge of self management of Hypertension as evidences by BP reading of 140/90 or less; or as defined by provider   On track    Reports takes medications as prescribed; attends provider visits as scheduled    . COMPLETED: Maintain timely refills of Heart Failure medication as prescribed within the year        Denies any issues with obtaining medications.    . Obtain annual  Lipid Profile, LDL-C   On track   . COMPLETED: Visit Primary Care Provider or Cardiologist at least 2 times per year       Has seen cardiologist at least twice this year.      RNCM reinforced recommendations per Device clinic re: salt restriction, fluid restrictions, weight gain, discussed signs/symptoms of exacerbation and heart failure action plan.  RNCM reviewed Covid 19 precautions; encouraged client to call healthcare concierge for benefits questions and encouraged client to call RNCM as needed.   Plan: RNCM will send updated care plan to  providers, RNCM will send updated care plan to client. RNCM will follow up within the next 3 months. RNCM will assist with helping client to get a blood pressure cuff. RNCM will send another copy of advanced directive packet per client request.  Thea Silversmith, RN, MSN, [place] [phone]  .

## 2019-05-05 ENCOUNTER — Ambulatory Visit (INDEPENDENT_AMBULATORY_CARE_PROVIDER_SITE_OTHER): Payer: HMO | Admitting: Student

## 2019-05-05 ENCOUNTER — Ambulatory Visit (HOSPITAL_COMMUNITY)
Admission: RE | Admit: 2019-05-05 | Discharge: 2019-05-05 | Disposition: A | Discharge status: Home / Self Care | Payer: HMO | Source: Ambulatory Visit | Attending: Cardiology | Admitting: Cardiology

## 2019-05-05 ENCOUNTER — Other Ambulatory Visit: Payer: Self-pay

## 2019-05-05 ENCOUNTER — Encounter: Payer: Self-pay | Admitting: Student

## 2019-05-05 VITALS — BP 118/72 | HR 95 | Ht 66.0 in | Wt 215.2 lb

## 2019-05-05 DIAGNOSIS — I428 Other cardiomyopathies: Secondary | ICD-10-CM

## 2019-05-05 DIAGNOSIS — Z95810 Presence of automatic (implantable) cardiac defibrillator: Secondary | ICD-10-CM | POA: Diagnosis not present

## 2019-05-05 DIAGNOSIS — I5042 Chronic combined systolic (congestive) and diastolic (congestive) heart failure: Secondary | ICD-10-CM | POA: Diagnosis not present

## 2019-05-05 DIAGNOSIS — I493 Ventricular premature depolarization: Secondary | ICD-10-CM

## 2019-05-05 DIAGNOSIS — I5022 Chronic systolic (congestive) heart failure: Secondary | ICD-10-CM | POA: Insufficient documentation

## 2019-05-05 LAB — CUP PACEART INCLINIC DEVICE CHECK
Date Time Interrogation Session: 20201211125950
Implantable Lead Implant Date: 20180312
Implantable Lead Location: 753860
Implantable Lead Model: 293
Implantable Lead Serial Number: 422842
Implantable Pulse Generator Implant Date: 20180312
Lead Channel Pacing Threshold Amplitude: 1 V
Lead Channel Pacing Threshold Pulse Width: 0.4 ms
Lead Channel Setting Pacing Amplitude: 2.5 V
Lead Channel Setting Pacing Pulse Width: 0.4 ms
Pulse Gen Serial Number: 226601

## 2019-05-05 LAB — BASIC METABOLIC PANEL
Anion gap: 6 (ref 5–15)
BUN: 7 mg/dL (ref 6–20)
CO2: 26 mmol/L (ref 22–32)
Calcium: 8.6 mg/dL — ABNORMAL LOW (ref 8.9–10.3)
Chloride: 107 mmol/L (ref 98–111)
Creatinine, Ser: 0.78 mg/dL (ref 0.44–1.00)
GFR calc Af Amer: >60 mL/min (ref >60)
GFR calc non Af Amer: >60 mL/min (ref >60)
Glucose, Bld: 90 mg/dL (ref 70–99)
Potassium: 3.4 mmol/L — ABNORMAL LOW (ref 3.5–5.1)
Sodium: 139 mmol/L (ref 135–145)

## 2019-05-05 NOTE — Patient Instructions (Signed)
Medication Instructions:  none *If you need a refill on your cardiac medications before your next appointment, please call your pharmacy*  Lab Work: none If you have labs (blood work) drawn today and your tests are completely normal, you will receive your results only by: Marland Kitchen MyChart Message (if you have MyChart) OR . A paper copy in the mail If you have any lab test that is abnormal or we need to change your treatment, we will call you to review the results.  Testing/Procedures: none  Follow-Up: At [facility], you and your health needs are our priority.  As part of our continuing mission to provide you with exceptional heart care, we have created designated Provider Care Teams.  These Care Teams include your primary Cardiologist (physician) and Advanced Practice Providers (APPs -  Physician Assistants and Nurse Practitioners) who all work together to provide you with the care you need, when you need it.  Your next appointment:   1 year(s)  The format for your next appointment:   Either In Person or Virtual  Provider:   Dr Caryl Comes  Other Instructions Remote monitoring is used to monitor your  ICD from home. This monitoring reduces the number of office visits required to check your device to one time per year. It allows [place] to keep an eye on the functioning of your device to ensure it is working properly. You are scheduled for a device check from home on 06/20/19. You may send your transmission at any time that day. If you have a wireless device, the transmission will be sent automatically. After your physician reviews your transmission, you will receive a postcard with your next transmission date.

## 2019-05-05 NOTE — Progress Notes (Signed)
Electrophysiology Office Note Date: 05/05/2019  ID:  [name], DOB 09-07-1975, MRN [id number]  PCP: Hoyt Koch, MD Primary Cardiologist: Dr. Haroldine Laws  Electrophysiologist: Dr. Caryl Comes   CC: Routine ICD follow-up  [name] is a 43 y.o. female seen today for Dr. Caryl Comes.  They present today for routine electrophysiology followup.  Since last being seen in our clinic, the patient reports OK overall.  She has had more fatigue, and in following with the HF clinic it has been noted her EF has again dropped. She denies chest pain or non-compliance with her medications. No syncope or near syncope. Watching caffeine intake, and no ETOH.   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 for chronic systolic CHF History of appropriate therapy: No History of AAD therapy: No   Past Medical History:  Diagnosis Date  . Acute on chronic systolic (congestive) heart failure ([place]) 04/29/2017  . Acute pain of right shoulder 06/16/2017  . AICD (automatic cardioverter/defibrillator) present   . Anxiety   . Arthritis    right shoulder   . Asthma   . CHF (congestive heart failure) ([place])   . Chronic combined systolic and diastolic heart failure ([place]) 03/05/2014  . Closed low lateral malleolus fracture 10/23/2013  . Cystitis 10/21/2017  . Depression   . Depression with anxiety 01/20/2013  . Diabetes mellitus without complication ([place])   . Diverticulosis   . Dyspnea    comes and goes intermittently mostly with exertion   . Essential hypertension    Prev followed by [employer]/ Cardiology   . Fibroid    age 50  . Gallstones   . Generalized abdominal cramping   . History of cardiomyopathy   . Hypertension   . IBS (irritable bowel syndrome)   . 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 Insomnia 05/07/2017  . Labral tear of shoulder 04/04/2015    Injected 04/04/2015 Injected 12/03/2015   . Migraine    "monthly" (08/03/2016)   . Myofascial pain 06/16/2017  . NICM (nonischemic cardiomyopathy) ([place]) 08/03/2016  . Nonallopathic lesion of lumbosacral region 11/16/2016  . Nonallopathic lesion of sacral region 11/16/2016  . Nonallopathic lesion of thoracic region 08/20/2014  . Nonspecific chest pain 04/28/2017  . OSA (obstructive sleep apnea) 01/02/2013   NPSG 2009:  AHI 9/hr. CPAP intolerance >> "smothering" Good tolerance of auto device (optimal pressure 12-13 on download).  - referred to Dr Gwenette Greet    . OSA on CPAP   . Ovarian cyst    1999; surgically removed  . Patellofemoral syndrome of both knees 10/16/2016  . 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 PVC (premature ventricular contraction) 06/23/2016  . Seizures ([place])    "as a child" (08/03/2016)  . Termination of pregnancy    due to cardiac risk   Past Surgical History:  Procedure Laterality Date  . CARDIAC CATHETERIZATION N/A 11/11/2015   Procedure: Right/Left Heart Cath and Coronary Angiography;  Surgeon: Troy Sine, MD;  Location: [facility];  Service: Cardiovascular;  Laterality: N/A;  . CARDIAC CATHETERIZATION  ~ 2015  . CARDIAC DEFIBRILLATOR PLACEMENT  08/03/2016  . CESAREAN SECTION  1999  . COLONOSCOPY WITH PROPOFOL N/A 04/21/2016   Procedure: COLONOSCOPY WITH PROPOFOL;  Surgeon: Jerene Bears, MD;  Location: [facility];  Service: Gastroenterology;  Laterality: N/A;  . ESOPHAGOGASTRODUODENOSCOPY (EGD) WITH PROPOFOL N/A 04/21/2016   Procedure: ESOPHAGOGASTRODUODENOSCOPY (EGD) WITH PROPOFOL;  Surgeon: Jerene Bears, MD;  Location:  [facility];  Service: Gastroenterology;  Laterality: N/A;  . FOOT FRACTURE SURGERY Right ~ 2003  . FRACTURE SURGERY    . ICD IMPLANT N/A 08/03/2016   Procedure: ICD Implant;  Surgeon: Deboraha Sprang, MD;  Location: [facility];  Service: Cardiovascular;  Laterality: N/A;  . LAPAROSCOPIC CHOLECYSTECTOMY  12/2006  . LAPAROSCOPIC GASTRIC SLEEVE RESECTION    . LAPAROSCOPY ABDOMEN DIAGNOSTIC   2008   "cut bile duct w/gallbladder OR; had to go in later & fix leak; hospitalized for 2 months"  . LEFT HEART CATHETERIZATION WITH CORONARY ANGIOGRAM N/A 02/26/2014   Procedure: LEFT HEART CATHETERIZATION WITH CORONARY ANGIOGRAM;  Surgeon: Jettie Booze, MD;  Location: [facility];  Service: Cardiovascular;  Laterality: N/A;  . OVARIAN CYST REMOVAL Right 1999  . PVC ABLATION N/A 12/01/2017   Procedure: PVC ABLATION;  Surgeon: Evans Lance, MD;  Location: [facility];  Service: Cardiovascular;  Laterality: N/A;  . RIGHT HEART CATH N/A 08/05/2017   Procedure: RIGHT HEART CATH;  Surgeon: Jolaine Artist, MD;  Location: [facility];  Service: Cardiovascular;  Laterality: N/A;  . TUBAL LIGATION  1999    Current Outpatient Medications  Medication Sig Dispense Refill  . albuterol (PROAIR HFA) 108 (90 Base) MCG/ACT inhaler Inhale 1-2 puffs into the lungs every 6 (six) hours as needed for wheezing or shortness of breath. 18 g 1  . amiodarone (PACERONE) 200 MG tablet Take 1 tablet by mouth once daily 30 tablet 5  . butalbital-acetaminophen-caffeine (FIORICET) 50-325-40 MG tablet TAKE 1 TO 2 TABLETS BY MOUTH EVERY 6 HOURS AS NEEDED FOR HEADACHE 30 tablet 0  . colestipol (COLESTID) 1 g tablet Take 2 tablets (2 g total) by mouth 2 (two) times daily. 120 tablet 1  . diclofenac sodium (VOLTAREN) 1 % GEL Apply 4 g topically 4 (four) times daily. 300 g 6  . Diclofenac Sodium 2 % SOLN Place 2 g onto the skin 2 (two) times daily. 112 g 3  . dicyclomine (BENTYL) 20 MG tablet Take 20 mg by mouth 2 (two) times daily as needed for spasms.    . fluticasone (FLONASE) 50 MCG/ACT nasal spray Place 2 sprays into both nostrils daily as needed for allergies. 48 g 0  . gabapentin (NEURONTIN) 100 MG capsule Take 200 mg by mouth at bedtime as needed (for pain).     . Ibuprofen-Famotidine 800-26.6 MG TABS Take 1 tablet by mouth 3 (three) times daily as needed. 90 tablet 3  . ivabradine (CORLANOR) 7.5  MG TABS tablet TAKE 1 TABLET (7.5 MG TOTAL) BY MOUTH 2 (TWO) TIMES DAILY WITH A MEAL. 180 tablet 2  . lidocaine (LIDODERM) 5 % Place 1 patch onto the skin daily. Remove & Discard patch within 12 hours or as directed by MD 30 patch 6  . linaclotide (LINZESS) 145 MCG CAPS capsule Take 1 capsule (145 mcg total) by mouth daily before breakfast. 30 capsule 3  . Multiple Vitamins-Minerals (MULTIVITAMIN GUMMIES WOMENS PO) Take 2 tablets by mouth daily.     . nitroGLYCERIN (NITROSTAT) 0.4 MG SL tablet Place 1 tablet (0.4 mg total) under the tongue every 5 (five) minutes as needed for chest pain. 75 tablet 0  . omeprazole (PRILOSEC) 20 MG capsule Take 20 mg by mouth daily as needed (for reflux symptoms).     . potassium chloride (KLOR-CON) 10 MEQ tablet Take 5 tablets (50 mEq total) by mouth daily. 150 tablet 3  . sacubitril-valsartan (ENTRESTO) 97-103 MG  Take 1 tablet by mouth 2 (two) times daily. 60 tablet 6  . spironolactone (ALDACTONE) 25 MG tablet Take 1 tablet (25 mg total) by mouth daily. 90 tablet 2  . SYMBICORT 160-4.5 MCG/ACT inhaler INHALE 2 PUFFS INTO THE LUNGS 2 (TWO) TIMES DAILY AS NEEDED. 30.6 Inhaler 3  . tiZANidine (ZANAFLEX) 4 MG tablet Take 4 mg by mouth every 8 (eight) hours as needed for muscle spasms.     Marland Kitchen torsemide (DEMADEX) 20 MG tablet Take 2 tablets (40 mg total) by mouth 2 (two) times daily. 190 tablet 3  . Vitamin D, Ergocalciferol, (DRISDOL) 1.25 MG (50000 UT) CAPS capsule Take 1 capsule (50,000 Units total) by mouth every 7 (seven) days. 12 capsule 0  . zolpidem (AMBIEN) 5 MG tablet Take 1 tablet (5 mg total) by mouth at bedtime as needed for sleep. 30 tablet 3   No current facility-administered medications for this visit.    Allergies:   Aspirin, Vancomycin, Contrast media [iodinated diagnostic agents], Avocado, Ciprofloxacin, and Sulfa antibiotics   Social History: Social History   Socioeconomic History  . Marital status: Married    Spouse name: Not on file  . Number  of children: 1  . Years of education: Not on file  . Highest education level: Not on file  Occupational History  . Occupation: stay at home mom  Tobacco Use  . Smoking status: Never Smoker  . Smokeless tobacco: Never Used  Substance and Sexual Activity  . Alcohol use: No  . Drug use: No  . Sexual activity: Yes  Other Topics Concern  . Not on file  Social History Narrative  . Not on file   Social Determinants of Health   Financial Resource Strain:   . Difficulty of Paying Living Expenses: Not on file  Food Insecurity:   . Worried About Charity fundraiser in the Last Year: Not on file  . Ran Out of Food in the Last Year: Not on file  Transportation Needs:   . Lack of Transportation (Medical): Not on file  . Lack of Transportation (Non-Medical): Not on file  Physical Activity:   . Days of Exercise per Week: Not on file  . Minutes of Exercise per Session: Not on file  Stress:   . Feeling of Stress : Not on file  Social Connections:   . Frequency of Communication with Friends and Family: Not on file  . Frequency of Social Gatherings with Friends and Family: Not on file  . Attends Religious Services: Not on file  . Active Member of Clubs or Organizations: Not on file  . Attends Archivist Meetings: Not on file  . Marital Status: Not on file  Intimate Partner Violence:   . Fear of Current or Ex-Partner: Not on file  . Emotionally Abused: Not on file  . Physically Abused: Not on file  . Sexually Abused: Not on file    Family History: Family History  Problem Relation Age of Onset  . Emphysema Maternal Grandmother        smoked  . Heart disease Maternal Grandmother 7       MI  . Rheum arthritis Mother   . Allergies Daughter   . Colon cancer Neg Hx     Review of Systems: All other systems reviewed and are otherwise negative except as noted above.   Physical Exam: Vitals:   05/05/19 1227  BP: 118/72  Pulse: 95  SpO2: 99%  Weight: 215 lb 3.2 oz (97.6  kg)  Height: 5\' 6"  (1.676 m)     GEN- The patient is well appearing, alert and oriented x 3 today.   HEENT: normocephalic, atraumatic; sclera clear, conjunctiva pink; hearing intact; oropharynx clear; neck supple, no JVP Lymph- no cervical lymphadenopathy Lungs- Clear to ausculation bilaterally, normal work of breathing.  No wheezes, rales, rhonchi Heart- Regular rate and rhythm, no murmurs, rubs or gallops, PMI not laterally displaced GI- soft, non-tender, non-distended, bowel sounds present, no hepatosplenomegaly Extremities- no clubbing, cyanosis, or edema; DP/PT/radial pulses 2+ bilaterally MS- no significant deformity or atrophy Skin- warm and dry, no rash or lesion; ICD pocket well healed Psych- euthymic mood, full affect Neuro- strength and sensation are intact  ICD interrogation- reviewed in detail today,  See PACEART report  EKG:  EKG is not ordered today. The ekg ordered 04/19/2019 shows NSR at 90 bpm with PVCs  Recent Labs: 05/19/2018: B Natriuretic Peptide 395.9 04/12/2019: Hemoglobin 10.8; Platelets 210 04/19/2019: ALT 11; BUN 6; Creatinine, Ser 0.80; Magnesium 2.1; Potassium 3.4; Sodium 141; TSH 2.387   Wt Readings from Last 3 Encounters:  04/19/19 214 lb 6.4 oz (97.3 kg)  04/12/19 206 lb (93.4 kg)  03/21/19 213 lb (96.6 kg)     Other studies Reviewed: Additional studies/ records that were reviewed today include: Echo 11/01/2018 LVEF 25-30%,    Assessment and Plan:  1.  Chronic systolic dysfunction s/p [employer] single chamber ICD  Her EF has dropped from 55-55% in 11/2017 to 25-30% in July of this year. euvolemic today Stable on an appropriate medical regimen. Previously taken of BB with low output, then not resumed as she had recovery of EF off of it.  Normal ICD function See Claudia Desanctis Art report No changes today  2. PVCs s/p ablation 12/01/2017 Zio personally reviewed via [employer]io[employer] (Her final interpretation is still pending at this point) Pt had 17.9%  isolated PVCs and 8.2% of PVC couplets. (<1% triplets).  She is on amiodarone 200 mg daily Discussed with Dr. Lovena Le who performed her previous ablation. She would not be a candidate for repeat ablation. Her last ablation was not curative, and repeat would be unlikely to be successful.  She has been off BB since low output in 04/2018, and then not resumed once her EF had normalized. Would be reasonable to retry on low dose BB (3.125 mg BID of coreg or 12.5 mg of toprol XL daily) will discuss with HF team.  Current medicines are reviewed at length with the patient today.   The patient does not have concerns regarding her medicines.  The following changes were made today:  none  Labs/ tests ordered today include:  Orders Placed This Encounter  Procedures  . CUP PACEART INCLINIC DEVICE CHECK   Disposition:   Follow up with HF team as scheduled. Follow up with Dr. Caryl Comes in 12 months for usual ICD follow up. Sooner as needed.   Jacalyn Lefevre, PA-C  05/05/2019 8:37 AM  [facility] [address] [place] [address] [phone] (office) [phone] (fax)

## 2019-05-10 ENCOUNTER — Telehealth (HOSPITAL_COMMUNITY): Payer: Self-pay

## 2019-05-10 ENCOUNTER — Telehealth (HOSPITAL_COMMUNITY): Payer: Self-pay | Admitting: *Deleted

## 2019-05-10 MED ORDER — SPIRONOLACTONE 50 MG PO TABS: 50.0000 mg | ORAL_TABLET | Freq: Every day | ORAL | 6 refills | Status: DC

## 2019-05-10 MED ORDER — POTASSIUM CHLORIDE 20 MEQ PO PACK: 60.0000 meq | PACK | Freq: Every day | ORAL | 3 refills | Status: DC

## 2019-05-10 NOTE — Telephone Encounter (Signed)
-----   Message from Jolaine Artist, MD sent at 05/07/2019 10:22 PM EST ----- K low. Please make sure she is taking her potassium. Increase to 60 daily. Double spiro to 50 daily. Repeat BMET in 1 week and in 2 weeks. thanks

## 2019-05-10 NOTE — Telephone Encounter (Signed)

## 2019-05-10 NOTE — Telephone Encounter (Signed)
[name], RN  05/10/2019 1:44 PM EST    Pt aware, she states it is really difficult to get the 5 kcl tabs in daily and frequently she throws them up. She would like to try a different form of kcl, new rx sent in, pt is agreeable to increase Arlyce Harman to 50 mg, new rx sent in. Pt is sch for f/u tomorrow 12/17 w/Amy Ninfa Meeker, NP will arrange lab appt at that visit.   [name], RN  05/08/2019 4:05 PM EST    Left message to call back

## 2019-05-11 ENCOUNTER — Ambulatory Visit (HOSPITAL_COMMUNITY)
Admission: RE | Admit: 2019-05-11 | Discharge: 2019-05-11 | Disposition: A | Discharge status: Home / Self Care | Payer: HMO | Source: Ambulatory Visit | Attending: Cardiology | Admitting: Cardiology

## 2019-05-11 ENCOUNTER — Other Ambulatory Visit: Payer: Self-pay

## 2019-05-11 ENCOUNTER — Encounter (HOSPITAL_COMMUNITY): Payer: Self-pay

## 2019-05-11 VITALS — BP 128/63 | HR 107 | Wt 219.8 lb

## 2019-05-11 DIAGNOSIS — Z79899 Other long term (current) drug therapy: Secondary | ICD-10-CM | POA: Insufficient documentation

## 2019-05-11 DIAGNOSIS — J45909 Unspecified asthma, uncomplicated: Secondary | ICD-10-CM | POA: Diagnosis not present

## 2019-05-11 DIAGNOSIS — Z6835 Body mass index (BMI) 35.0-35.9, adult: Secondary | ICD-10-CM | POA: Insufficient documentation

## 2019-05-11 DIAGNOSIS — Z91041 Radiographic dye allergy status: Secondary | ICD-10-CM | POA: Diagnosis not present

## 2019-05-11 DIAGNOSIS — Z886 Allergy status to analgesic agent status: Secondary | ICD-10-CM | POA: Diagnosis not present

## 2019-05-11 DIAGNOSIS — I428 Other cardiomyopathies: Secondary | ICD-10-CM

## 2019-05-11 DIAGNOSIS — I5042 Chronic combined systolic (congestive) and diastolic (congestive) heart failure: Secondary | ICD-10-CM | POA: Diagnosis not present

## 2019-05-11 DIAGNOSIS — Z86718 Personal history of other venous thrombosis and embolism: Secondary | ICD-10-CM | POA: Diagnosis not present

## 2019-05-11 DIAGNOSIS — F419 Anxiety disorder, unspecified: Secondary | ICD-10-CM | POA: Diagnosis not present

## 2019-05-11 DIAGNOSIS — Z95810 Presence of automatic (implantable) cardiac defibrillator: Secondary | ICD-10-CM | POA: Diagnosis not present

## 2019-05-11 DIAGNOSIS — Z8261 Family history of arthritis: Secondary | ICD-10-CM | POA: Insufficient documentation

## 2019-05-11 DIAGNOSIS — R008 Other abnormalities of heart beat: Secondary | ICD-10-CM | POA: Insufficient documentation

## 2019-05-11 DIAGNOSIS — Z882 Allergy status to sulfonamides status: Secondary | ICD-10-CM | POA: Diagnosis not present

## 2019-05-11 DIAGNOSIS — I110 Hypertensive heart disease with heart failure: Secondary | ICD-10-CM | POA: Diagnosis not present

## 2019-05-11 DIAGNOSIS — F329 Major depressive disorder, single episode, unspecified: Secondary | ICD-10-CM | POA: Insufficient documentation

## 2019-05-11 DIAGNOSIS — Z9884 Bariatric surgery status: Secondary | ICD-10-CM | POA: Insufficient documentation

## 2019-05-11 DIAGNOSIS — Z881 Allergy status to other antibiotic agents status: Secondary | ICD-10-CM | POA: Diagnosis not present

## 2019-05-11 DIAGNOSIS — E119 Type 2 diabetes mellitus without complications: Secondary | ICD-10-CM | POA: Diagnosis not present

## 2019-05-11 DIAGNOSIS — I493 Ventricular premature depolarization: Secondary | ICD-10-CM | POA: Diagnosis not present

## 2019-05-11 MED ORDER — CARVEDILOL 6.25 MG PO TABS: 6.2500 mg | ORAL_TABLET | Freq: Two times a day (BID) | ORAL | 11 refills | Status: DC

## 2019-05-11 MED ORDER — FARXIGA 10 MG PO TABS: 10.0000 mg | ORAL_TABLET | Freq: Every day | ORAL | 11 refills | Status: DC

## 2019-05-11 NOTE — Progress Notes (Signed)
Advanced Heart Failure Clinic Note Date:  05/11/2019  ID:  [name], DOB 07-18-1975, MRN [id number]  PCP:  Hoyt Koch, MD  Cardiologist: Pernell Dupre, MD HF: Dr Haroldine Laws EP: Dr. Andre Lefort [name] is a 43 y.o. 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   Was admitted 12/5-11/2016 with recurrent HF, URI, CP and elevated BP (160/110). CT chest no PE. Diuresed from 233-> 228 pounds.   Admitted 11/2017 with CP described as "pounding". Troponins were negative. Symptoms thought to be related to frequent PVCs. EP consulted and scheduled her for PVC ablation. Echo repeated 11/22/17 and showed improved EF 50-55%. HF team consulted and changed torsemide to 40 mg daily (from 3x/week).   Underwent PVC ablation 12/01/17. Was felt not to be successful.  Was on flecainide but discontinued due to reduced EF. Now on amiodarone.   Repeat Echo 10/2018 showed drop in EF back down to 25-30%. Was instructed to f/u in clinic but pt did not. She had outpatient monitor 04/2018 that showed rare PVCs.    She recently presented to the ED 11/18 w/ CC of chest pain. According to EDP documentation, pain had been present for 3-6 hrs prior to presenting to the ED. Described as central-left sided heaviness/pressure/tightness that was worse with exertion. No pleuritic pain or hypoxia to suggest acute PE. Hs Troponin normal x 2 (4>>5). EKG showed sinus tach 104 w/ frequent PVCs. Device interrogation was unremarkable. She was released from the ED and instructed to f/u in clinic.   Saw EP on 12/11 with suggestion for bb again.   Today she returns for HF follow up.Overall feeling just ok. Complaining of fatigue and feels like she is having more  palpitations. Mild SOB with steps.  Denies PND/Orthopnea. Appetite ok. No fever or chills. Weight at home 200-206 pounds. Taking all medications.   Boston Scientific Score 35: Worsening S3, thoracic Impedance, night heart rate, and respiratory rate.   Cardiac Studies  Echo done 05/19/2018 by Dr Haroldine Laws --. EF back down to 20-25% - Bedside echo by Dr. Haroldine Laws with EF improved to 50%. Echo 6/20 - EF back down at 25-30%.   Cardiac Testing.  Cath 6/17 revealed normal vessels, moderate elevation in pulmonary artery pressures, and LVEF 25-30%.  [place] 3/19: RA = 2 RV = 26/7 PA = 28/13 (19) PCW = 5 Fick cardiac output/index = 5.7/2.8 PVR = 2.5 WU Ao sat = 99% PA sat = 71%, 72% High SVC sat = 65% RHC 6/17 RA 5 RV 35/6 PA 41/12 PW 11 Fick 6.4/3.0 PA sat 62%  Echo 6/15 35-40% Echo 6/17 25-30% Echo 12/17 20-25% Echo 8/18 EF 25% Echo 7/19: EF 50-55% Echo 6/20: EF 25-30%  CPX 2/18 FVC 2.57 (79%)    FEV1 2.14 (81%)     FEV1/FVC 83 (101%)     MVV 107 (102%) Resting HR: 106 Peak HR: 166  (93% age predicted max HR) BP rest: 136/98 BP peak: 174/88 Peak VO2: 17.1 (85% predicted peak VO2) - corrects to 28.4 for ibw VE/VCO2 slope: 33 OUES: 2.16 Peak RER: 1.09 Ventilatory Threshold: 14.6 (73% predicted or measured peak VO2) VE/MVV: 59% PETCO2 at peak: 33 O2pulse: 10  (83% predicted O2pulse)  CPX 12/18 pVO2 14.0 (corrects to  23.0 for IBW) VeVCO2  32 RER 1.0  Review of systems complete and found to be negative unless listed in HPI.    Past Medical History:  Diagnosis Date  . Acute on chronic systolic (congestive) heart failure ([place]) 04/29/2017  . Acute pain of right shoulder 06/16/2017  . AICD (automatic cardioverter/defibrillator) present   . Anxiety   . Arthritis    right shoulder   . Asthma   . CHF (congestive heart failure) ([place])   . Chronic combined systolic and diastolic heart failure ([place]) 03/05/2014  . Closed low lateral malleolus fracture 10/23/2013    . Cystitis 10/21/2017  . Depression   . Depression with anxiety 01/20/2013  . Diabetes mellitus without complication ([place])   . Diverticulosis   . Dyspnea    comes and goes intermittently mostly with exertion   . Essential hypertension    Prev followed by [facility]/ Cardiology   . Fibroid    age 89  . Gallstones   . Generalized abdominal cramping   . History of cardiomyopathy   . Hypertension   . IBS (irritable bowel syndrome)   . 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 Insomnia 05/07/2017  . Labral tear of shoulder 04/04/2015    Injected 04/04/2015 Injected 12/03/2015   . Migraine    "monthly" (08/03/2016)  . Myofascial pain 06/16/2017  . NICM (nonischemic cardiomyopathy) ([place]) 08/03/2016  . Nonallopathic lesion of lumbosacral region 11/16/2016  . Nonallopathic lesion of sacral region 11/16/2016  . Nonallopathic lesion of thoracic region 08/20/2014  . Nonspecific chest pain 04/28/2017  . OSA (obstructive sleep apnea) 01/02/2013   NPSG 2009:  AHI 9/hr. CPAP intolerance >> "smothering" Good tolerance of auto device (optimal pressure 12-13 on download).  - referred to Dr Gwenette Greet    . OSA on CPAP   . Ovarian cyst    1999; surgically removed  . Patellofemoral syndrome of both knees 10/16/2016  . 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 PVC (premature ventricular contraction) 06/23/2016  . Seizures ([place])    "as a child" (08/03/2016)  . Termination of pregnancy    due to cardiac risk    Past Surgical History:  Procedure Laterality Date  . CARDIAC CATHETERIZATION N/A 11/11/2015   Procedure: Right/Left Heart Cath and Coronary Angiography;  Surgeon: Troy Sine, MD;  Location: [place] [facility];  Service: Cardiovascular;  Laterality: N/A;  . CARDIAC CATHETERIZATION  ~ 2015  . CARDIAC DEFIBRILLATOR PLACEMENT  08/03/2016  . CESAREAN SECTION  1999  . COLONOSCOPY WITH PROPOFOL N/A 04/21/2016   Procedure: COLONOSCOPY WITH PROPOFOL;   Surgeon: Jerene Bears, MD;  Location: [facility];  Service: Gastroenterology;  Laterality: N/A;  . ESOPHAGOGASTRODUODENOSCOPY (EGD) WITH PROPOFOL N/A 04/21/2016   Procedure: ESOPHAGOGASTRODUODENOSCOPY (EGD) WITH PROPOFOL;  Surgeon: Jerene Bears, MD;  Location: [facility];  Service: Gastroenterology;  Laterality: N/A;  . FOOT FRACTURE SURGERY Right ~ 2003  . FRACTURE SURGERY    . ICD IMPLANT N/A 08/03/2016   Procedure: ICD Implant;  Surgeon: Deboraha Sprang, MD;  Location: [place] [facility];  Service: Cardiovascular;  Laterality: N/A;  . LAPAROSCOPIC CHOLECYSTECTOMY  12/2006  . LAPAROSCOPIC GASTRIC SLEEVE RESECTION    . LAPAROSCOPY ABDOMEN DIAGNOSTIC  2008   "cut bile duct w/gallbladder OR; had to go in later & fix leak; hospitalized for 2 months"  . LEFT HEART CATHETERIZATION WITH CORONARY ANGIOGRAM N/A 02/26/2014   Procedure: LEFT HEART CATHETERIZATION WITH CORONARY ANGIOGRAM;  Surgeon: Conception Oms  Hassell Done, MD;  Location: [facility] CATH LAB;  Service: Cardiovascular;  Laterality: N/A;  . OVARIAN CYST REMOVAL Right 1999  . PVC ABLATION N/A 12/01/2017   Procedure: PVC ABLATION;  Surgeon: Evans Lance, MD;  Location: [place] [facility] LAB;  Service: Cardiovascular;  Laterality: N/A;  . RIGHT HEART CATH N/A 08/05/2017   Procedure: RIGHT HEART CATH;  Surgeon: Jolaine Artist, MD;  Location: [place] [facility];  Service: Cardiovascular;  Laterality: N/A;  . TUBAL LIGATION  1999    Current Medications:  Current Meds  Medication Sig  . albuterol (PROAIR HFA) 108 (90 Base) MCG/ACT inhaler Inhale 1-2 puffs into the lungs every 6 (six) hours as needed for wheezing or shortness of breath.  Marland Kitchen amiodarone (PACERONE) 200 MG tablet Take 1 tablet by mouth once daily  . butalbital-acetaminophen-caffeine (FIORICET) 50-325-40 MG tablet TAKE 1 TO 2 TABLETS BY MOUTH EVERY 6 HOURS AS NEEDED FOR HEADACHE  . colestipol (COLESTID) 1 g tablet Take 2 tablets (2 g total) by mouth 2 (two) times daily.  . diclofenac  sodium (VOLTAREN) 1 % GEL Apply 4 g topically 4 (four) times daily.  . Diclofenac Sodium 2 % SOLN Place 2 g onto the skin 2 (two) times daily.  Marland Kitchen dicyclomine (BENTYL) 20 MG tablet Take 20 mg by mouth 2 (two) times daily as needed for spasms.  . fluticasone (FLONASE) 50 MCG/ACT nasal spray Place 2 sprays into both nostrils daily as needed for allergies.  Marland Kitchen gabapentin (NEURONTIN) 100 MG capsule Take 200 mg by mouth at bedtime as needed (for pain).   . Ibuprofen-Famotidine 800-26.6 MG TABS Take 1 tablet by mouth 3 (three) times daily as needed.  . ivabradine (CORLANOR) 7.5 MG TABS tablet TAKE 1 TABLET (7.5 MG TOTAL) BY MOUTH 2 (TWO) TIMES DAILY WITH A MEAL.  Marland Kitchen lidocaine (LIDODERM) 5 % Place 1 patch onto the skin daily. Remove & Discard patch within 12 hours or as directed by MD  . linaclotide (LINZESS) 145 MCG CAPS capsule Take 1 capsule (145 mcg total) by mouth daily before breakfast.  . Multiple Vitamins-Minerals (MULTIVITAMIN GUMMIES WOMENS PO) Take 2 tablets by mouth daily.   . nitroGLYCERIN (NITROSTAT) 0.4 MG SL tablet Place 1 tablet (0.4 mg total) under the tongue every 5 (five) minutes as needed for chest pain.  Marland Kitchen omeprazole (PRILOSEC) 20 MG capsule Take 20 mg by mouth daily as needed (for reflux symptoms).   . potassium chloride (KLOR-CON) 20 MEQ packet Take 60 mEq by mouth daily.  . sacubitril-valsartan (ENTRESTO) 97-103 MG Take 1 tablet by mouth 2 (two) times daily.  Marland Kitchen spironolactone (ALDACTONE) 50 MG tablet Take 1 tablet (50 mg total) by mouth daily.  . SYMBICORT 160-4.5 MCG/ACT inhaler INHALE 2 PUFFS INTO THE LUNGS 2 (TWO) TIMES DAILY AS NEEDED.  Marland Kitchen tiZANidine (ZANAFLEX) 4 MG tablet Take 4 mg by mouth every 8 (eight) hours as needed for muscle spasms.   Marland Kitchen torsemide (DEMADEX) 20 MG tablet Take 2 tablets (40 mg total) by mouth 2 (two) times daily.  . Vitamin D, Ergocalciferol, (DRISDOL) 1.25 MG (50000 UT) CAPS capsule Take 1 capsule (50,000 Units total) by mouth every 7 (seven) days.  Marland Kitchen  zolpidem (AMBIEN) 5 MG tablet Take 1 tablet (5 mg total) by mouth at bedtime as needed for sleep.   Allergies:   Aspirin, Vancomycin, Contrast media [iodinated diagnostic agents], Avocado, Ciprofloxacin, and Sulfa antibiotics   Social History   Socioeconomic History  . Marital status: Married    Spouse  name: Not on file  . Number of children: 1  . Years of education: Not on file  . Highest education level: Not on file  Occupational History  . Occupation: stay at home mom  Tobacco Use  . Smoking status: Never Smoker  . Smokeless tobacco: Never Used  Substance and Sexual Activity  . Alcohol use: No  . Drug use: No  . Sexual activity: Yes  Other Topics Concern  . Not on file  Social History Narrative  . Not on file   Social Determinants of Health   Financial Resource Strain:   . Difficulty of Paying Living Expenses: Not on file  Food Insecurity:   . Worried About Charity fundraiser in the Last Year: Not on file  . Ran Out of Food in the Last Year: Not on file  Transportation Needs:   . Lack of Transportation (Medical): Not on file  . Lack of Transportation (Non-Medical): Not on file  Physical Activity:   . Days of Exercise per Week: Not on file  . Minutes of Exercise per Session: Not on file  Stress:   . Feeling of Stress : Not on file  Social Connections:   . Frequency of Communication with Friends and Family: Not on file  . Frequency of Social Gatherings with Friends and Family: Not on file  . Attends Religious Services: Not on file  . Active Member of Clubs or Organizations: Not on file  . Attends Archivist Meetings: Not on file  . Marital Status: Not on file    Family History:  The patient's family history includes Allergies in her daughter; Emphysema in her maternal grandmother; Heart disease (age of onset: 36) in her maternal grandmother; Rheum arthritis in her mother.   Vitals:   05/11/19 1322  BP: 128/63  Pulse: (!) 107  SpO2: 98%  Weight: 99.7  kg (219 lb 12.8 oz)    Wt Readings from Last 3 Encounters:  05/11/19 99.7 kg (219 lb 12.8 oz)  05/05/19 97.6 kg (215 lb 3.2 oz)  04/19/19 97.3 kg (214 lb 6.4 oz)    Physical Exam General:  No resp difficulty HEENT: normal Neck: supple. no JVD. Carotids 2+ bilat; no bruits. No lymphadenopathy or thryomegaly appreciated. Cor: PMI nondisplaced. Regular rate & rhythm. No rubs, gallops or murmurs. Lungs: clear Abdomen: soft, nontender, nondistended. No hepatosplenomegaly. No bruits or masses. Good bowel sounds. Extremities: no cyanosis, clubbing, rash, edema Neuro: alert & orientedx3, cranial nerves grossly intact. moves all 4 extremities w/o difficulty. Affect pleasant  EKG: Sinus Tach with occasional PVCs Assessment and Plan  1. Chronic systolic HF:  due to peri-partum CM, onset 1999. S/P [employer] ICD 07/2016 - EF 20-25% (12/17).  - Echo 8/18 EF 25-30% s/p ICD 3/18. [place] 3/19: RA 2, PA 28/13, Fick CO 5.7, CI 2.8 - CPX 12/18 showed moderate limitation due mostly to obesity with some HF component - Echo 11/22/17 LVEF 50-55%, Grade 1 DD, Mild MR, Mod LAE - Echo 04/2018 by Dr Haroldine Laws EF20-25%. RV ok - Bedside echo by Dr. Haroldine Laws with EF improved to 50%. - Echo 10/2018 showed drop in EF back down to 25-30%.  Given drop in EF again and frequent PVCs, Zio Patch was ordered. This showed > 17% PVC burden.  - Will need to try carvedilol 6.25 mg twice a day and continue amiodarone.  -  NYHA II-III Continue torsemide 40 mg daily.   - Continue Entresto 97-103 mg BID - Continue Spiro 50 mg  daily.  - Continue corlanor 7.5mg  BID for now.  - Add farxiga 10 mg daily.   2. Morbid obesity - s/p Gastric Sleeve at [facility] 09/20/17  - Body mass index is 35.48 kg/m.  -Discussed portion control.   3. OSA - Repeat sleep study in 2019 was negative for sleep apnea.   4. Anxiety/depression - Per PCP. No change.   5. DM2 - A1c in Jan. 2018 is 5.3 with medications.  - Added farxiga today     6. Frequent PVCs/ Bigeminy - Holter Monitor 11/09/17 with 30% PVCs with one primary morphology.  - S/p PVC ablation 11/2017 with Dr Lovena Le unsuccessful - Off flecainide with EF down and persistent PVCs. Changed to amio - Zio Patch 04/2019 > 17% PVCs and >8% couplets.   - Continue amio 200 mg daily for now.  - needs annual eye exams   Follow up next week for BMET  - If no improvement may need RHC versus CPX test.  - She will need another ECHO soon but will continue to try and suppress PVCs.     Darrick Grinder, NP  05/11/2019 1:36 PM

## 2019-05-11 NOTE — Patient Instructions (Signed)
START Carvedilol 6.25 mg, one tab twice daily START Farxiga 10 mg, one tab daily  Labs needed in one week  Your physician recommends that you schedule a follow-up appointment in: 3-4 weeks  in the Advanced Practitioners (PA/NP) Clinic   Do the following things EVERYDAY: 1) Weigh yourself in the morning before breakfast. Write it down and keep it in a log. 2) Take your medicines as prescribed 3) Eat low salt foods--Limit salt (sodium) to 2000 mg per day.  4) Stay as active as you can everyday 5) Limit all fluids for the day to less than 2 liters  At the [facility], you and your health needs are our priority. As part of our continuing mission to provide you with exceptional heart care, we have created designated Provider Care Teams. These Care Teams include your primary Cardiologist (physician) and Advanced Practice Providers (APPs- Physician Assistants and Nurse Practitioners) who all work together to provide you with the care you need, when you need it.   You may see any of the following providers on your designated Care Team at your next follow up: Marland Kitchen Dr Glori Bickers . Dr Loralie Champagne . Darrick Grinder, NP . Lyda Jester, PA . Audry Riles, PharmD   Please be sure to bring in all your medications bottles to every appointment.

## 2019-05-13 ENCOUNTER — Encounter: Payer: Self-pay | Admitting: Internal Medicine

## 2019-05-15 ENCOUNTER — Telehealth: Payer: Self-pay

## 2019-05-15 ENCOUNTER — Encounter: Payer: Self-pay | Admitting: Internal Medicine

## 2019-05-15 ENCOUNTER — Ambulatory Visit (INDEPENDENT_AMBULATORY_CARE_PROVIDER_SITE_OTHER): Payer: HMO

## 2019-05-15 DIAGNOSIS — I5042 Chronic combined systolic (congestive) and diastolic (congestive) heart failure: Secondary | ICD-10-CM

## 2019-05-15 DIAGNOSIS — Z95810 Presence of automatic (implantable) cardiac defibrillator: Secondary | ICD-10-CM

## 2019-05-15 NOTE — Telephone Encounter (Signed)
Remote ICM transmission received.  Attempted call to patient regarding ICM remote transmission and left detailed message per DPR to return call.  Advised to return call for any fluid symptoms or questions.      

## 2019-05-15 NOTE — Progress Notes (Signed)
Attempted call to patient to provide Dr Bensimhon's recommendations.  Left message to return call as soon as possible.

## 2019-05-15 NOTE — Progress Notes (Signed)
Bensimhon, Shaune Pascal, MD  Nuri Branca Panda, RN  Please give metoalzone 2.5 and kdur 40 x 2 days.

## 2019-05-15 NOTE — Progress Notes (Signed)
EPIC Encounter for ICM Monitoring  Patient Name: [name] is a 42 y.o. female Date: 05/15/2019 Primary Care Physican: Hoyt Koch, MD Primary Cardiologist: Bensimhon Electrophysiologist: Caryl Comes 04/19/2019 Weight: 214 lbs (office weight)  Attempted call to patient and unable to reach.  Left detailed message per DPR regarding transmission. Transmission reviewed and results sent via my chart since unable to reach by phone.   Heartlogic Heart Failure Index alert. Worsening S3, Thoracic impedance, respiratory rate and night heart rate.  Index was 35 when patient was seen at 12/17 HF office visit.   HeartLogic Heart Failure Index: 04/23/2019 Index 15 - suggesting within normal threshold 05/12/2020 Index 40 - suggesting possible fluid accumulation  Prescribed:Torsemide20 mgTake 2 tablets (40 mg total) by mouthtwice a day. Potassium 20 mEq take 2 tablets(40 mEq total)daily. Can take extra Torsemide as needed and Dr Haroldine Laws has discussed sliding scale.  Labs:  BMET scheduled for 05/17/2019 05/05/2019 Creatinine 0.78, BUN 7,   Potassium 3.4, Sodium 139, GFR >60 04/19/2019 Creatinine 0.80, BUN 6,   Potassium 3.4, Sodium 141, GFR >60 04/12/2019 Creatinine 0.77, BUN 8,   Potassium 3.2, Sodium 137, GFR >60 02/10/2019 Creatinine 0.79, BUN 8, Potassium 3.3, Sodium 139, GFR 95.29 09/28/2018 Creatinine 0.90, BUN 11, Potassium 3.1, Sodium 140, GFR >60 01/02/2019 Creatinine0.98, BUN10, Potassium3.4, Sodium141, GFR>60 06/06/2018 Creatinine0.93, BUN7, Potassium 3.7, Sodium138, GFR>60  05/27/2018 Creatinine1.02, BUN8, Potassium 3.5, Sodium137, GFR>60 A complete set of results can be found in Results Review  Recommendations: Left voice mail with ICM number and encouraged to call if experiencing any fluid symptoms.  Follow-up plan: ICM clinic phone appointment on 05/23/2019 to recheck fluid levels.    91 day device clinic remote transmission 06/20/2019.  Office appt 06/13/2019 with HF Clinic PA.          Copy of ICM check sent to Dr. Caryl Comes and Dr Haroldine Laws.      [place], RN 05/15/2019 9:29 AM

## 2019-05-17 ENCOUNTER — Other Ambulatory Visit (HOSPITAL_COMMUNITY): Payer: HMO

## 2019-05-17 NOTE — Progress Notes (Signed)
No return call from patient and unable to reach her to provide Dr Bensimhon's orders.

## 2019-05-23 ENCOUNTER — Ambulatory Visit (INDEPENDENT_AMBULATORY_CARE_PROVIDER_SITE_OTHER): Payer: HMO

## 2019-05-23 DIAGNOSIS — Z95810 Presence of automatic (implantable) cardiac defibrillator: Secondary | ICD-10-CM

## 2019-05-23 DIAGNOSIS — I5042 Chronic combined systolic (congestive) and diastolic (congestive) heart failure: Secondary | ICD-10-CM

## 2019-05-24 NOTE — Progress Notes (Signed)
EPIC Encounter for ICM Monitoring  Patient Name: [name] is a 43 y.o. female Date: 05/24/2019 Primary Care Physican: Hoyt Koch, MD Primary Cardiologist:Bensimhon Electrophysiologist: Caryl Comes 04/19/2019 Weight:214lbs (office weight)  Transmission reviewed and results sent via my chart since unable to reach by phone.   Unable to provide Dr Bensimhon's recommendation, given on 12/21 to take metoalzone 2.5 and kdur 40 x 2 days.  Heartlogic Heart Failure Index alert. Worsening S3, Thoracic impedance, respiratory rate and night heart rate.  Index was 35 when patient was seen at 12/17 HF office visit.   HeartLogic Heart Failure Index: 11/29/2020Index 15- suggesting within normal threshold 05/12/2020 Index 40 - suggesting possible fluid accumulation 05/22/2019 Index 37 - suggesting improvement in possible fluid accumulation.  Prescribed:Torsemide20 mgTake 2 tablets (40 mg total) by mouthtwice a day. Potassium 20 mEq take 2 tablets(40 mEq total)daily. Can take extra Torsemide as needed and Dr Haroldine Laws has discussed sliding scale.  Labs:  BMET scheduled for 05/17/2019 05/05/2019 Creatinine 0.78, BUN 7,   Potassium 3.4, Sodium 139, GFR >60 04/19/2019 Creatinine 0.80, BUN 6, Potassium 3.4, Sodium 141, GFR >60 04/12/2019 Creatinine 0.77, BUN 8, Potassium 3.2, Sodium 137, GFR >60 02/10/2019 Creatinine 0.79, BUN 8, Potassium 3.3, Sodium 139, GFR 95.29 09/28/2018 Creatinine 0.90, BUN 11, Potassium 3.1, Sodium 140, GFR >60 01/02/2019 Creatinine0.98, BUN10, Potassium3.4, Sodium141, GFR>60 06/06/2018 Creatinine0.93, BUN7, Potassium 3.7, Sodium138, GFR>60  05/27/2018 Creatinine1.02, BUN8, Potassium 3.5, Sodium137, GFR>60 A complete set of results can be found in Results Review  Recommendations:   Advised patient to call Dr Haroldine Laws office for recommendations related to possible  fluid accumulation since patient is not reachable by phone.   Follow-up plan: ICM clinic phone appointment on 06/26/2019 and will monitor for alerts.   91 day device clinic remote transmission 06/20/2019.  Office appt 06/13/2019 with HF Clinic PA.       [place], RN 05/24/2019 2:38 PM

## 2019-05-29 ENCOUNTER — Ambulatory Visit (INDEPENDENT_AMBULATORY_CARE_PROVIDER_SITE_OTHER): Payer: HMO

## 2019-05-29 DIAGNOSIS — I5042 Chronic combined systolic (congestive) and diastolic (congestive) heart failure: Secondary | ICD-10-CM

## 2019-05-29 DIAGNOSIS — Z95810 Presence of automatic (implantable) cardiac defibrillator: Secondary | ICD-10-CM

## 2019-05-31 NOTE — Progress Notes (Signed)
EPIC Encounter for ICM Monitoring  Patient Name: [name] is a 44 y.o. female Date: 05/31/2019 Primary Care Physican: Hoyt Koch, MD Primary Cardiologist:Bensimhon Electrophysiologist: Caryl Comes 04/19/2019 Weight:214lbs (office weight)  Transmission reviewed and results sent via my chart.    Heartlogic Heart Failure Index alert improving.   HeartLogic Heart Failure Index: 05/22/2019 Index 37 - suggesting improvement in possible fluid accumulation. 05/27/2019 Index 24 - improvement but still possible fluid accumulation  Prescribed:Torsemide20 mgTake 2 tablets (40 mg total) by mouthtwice a day. Potassium 20 mEq take 2 tablets(40 mEq total)daily. Can take extra Torsemide as needed and Dr Haroldine Laws has discussed sliding scale.  Labs:Patient did not have 05/17/2019 BMET drawn 05/05/2019 Creatinine 0.78, BUN 7, Potassium 3.4, Sodium 139, GFR >60 04/19/2019 Creatinine 0.80, BUN 6, Potassium 3.4, Sodium 141, GFR >60 04/12/2019 Creatinine 0.77, BUN 8, Potassium 3.2, Sodium 137, GFR >60 A complete set of results can be found in Results Review  Recommendations:  Encouraged to call for fluid symptoms via mychart.  Follow-up plan: ICM clinic phone appointment on 07/03/2019 and will continue to monitor for alerts.   91 day device clinic remote transmission 06/20/2019.  Office appt 06/13/2019 with HF clinic PA/NP.          Copy of ICM check sent to Dr. Caryl Comes.  3 Month Trend    8 Day Data Trend            [name], RN 05/31/2019 9:57 AM

## 2019-06-13 ENCOUNTER — Encounter (HOSPITAL_COMMUNITY): Payer: HMO

## 2019-06-20 ENCOUNTER — Ambulatory Visit (INDEPENDENT_AMBULATORY_CARE_PROVIDER_SITE_OTHER): Payer: HMO | Admitting: *Deleted

## 2019-06-20 DIAGNOSIS — Z95810 Presence of automatic (implantable) cardiac defibrillator: Secondary | ICD-10-CM

## 2019-06-23 LAB — CUP PACEART REMOTE DEVICE CHECK
Battery Remaining Longevity: 162 mo
Battery Remaining Percentage: 100 %
Brady Statistic RV Percent Paced: 0 %
Date Time Interrogation Session: 20210129085500
HighPow Impedance: 60 Ohm
Implantable Lead Implant Date: 20180312
Implantable Lead Location: 753860
Implantable Lead Model: 293
Implantable Lead Serial Number: 422842
Implantable Pulse Generator Implant Date: 20180312
Lead Channel Impedance Value: 551 Ohm
Lead Channel Setting Pacing Amplitude: 2.5 V
Lead Channel Setting Pacing Pulse Width: 0.4 ms
Lead Channel Setting Sensing Sensitivity: 0.5 mV
Pulse Gen Serial Number: 226601

## 2019-07-03 ENCOUNTER — Ambulatory Visit (INDEPENDENT_AMBULATORY_CARE_PROVIDER_SITE_OTHER): Payer: HMO

## 2019-07-03 DIAGNOSIS — Z95810 Presence of automatic (implantable) cardiac defibrillator: Secondary | ICD-10-CM

## 2019-07-03 DIAGNOSIS — I5042 Chronic combined systolic (congestive) and diastolic (congestive) heart failure: Secondary | ICD-10-CM

## 2019-07-07 NOTE — Progress Notes (Signed)
EPIC Encounter for ICM Monitoring  Patient Name: [name] is a 44 y.o. female Date: 07/07/2019 Primary Care Physican: Hoyt Koch, MD Primary [place] Electrophysiologist: Caryl Comes Last Weight:214lbs (office weight)  Transmission reviewed and results sent via my chart.    Heartlogic Heart Failure Index 2 suggesting within normal limits..   Prescribed:Torsemide20 mgTake 2 tablets (40 mg total) by mouthtwice a day. Potassium 20 mEq take 2 tablets(40 mEq total)daily. Can take extra Torsemide as needed and Dr Haroldine Laws has discussed sliding scale.  Labs: 05/05/2019 Creatinine 0.78, BUN 7, Potassium 3.4, Sodium 139, GFR >60 04/19/2019 Creatinine 0.80, BUN 6, Potassium 3.4, Sodium 141, GFR >60 04/12/2019 Creatinine 0.77, BUN 8, Potassium 3.2, Sodium 137, GFR >60 A complete set of results can be found in Results Review  Recommendations:  Encouraged to call for fluid symptoms via mychart.  Follow-up plan: ICM clinic phone appointment on 08/07/2019.   91 day device clinic remote transmission 09/19/2019.          Copy of ICM check sent to Dr. Caryl Comes.           [name], RN 07/07/2019 3:57 PM

## 2019-08-02 ENCOUNTER — Other Ambulatory Visit: Payer: Self-pay

## 2019-08-02 NOTE — Patient Outreach (Signed)
  [place] [facility]) Care Management Chronic Special Needs Program    08/02/2019  Name: [name], DOB: 05/29/1975  MRN: [id number]   [name] is enrolled in a chronic special needs plan for Heart Failure. RNCM called to assist client with completing health risk assessment and update care plan. No answer. HIPAA compliant message left.  Plan: RNCM will outreach within 2-3 weeks.  Thea Silversmith, RN, MSN, [place] [phone]

## 2019-08-07 ENCOUNTER — Ambulatory Visit (INDEPENDENT_AMBULATORY_CARE_PROVIDER_SITE_OTHER): Payer: HMO

## 2019-08-07 DIAGNOSIS — I5042 Chronic combined systolic (congestive) and diastolic (congestive) heart failure: Secondary | ICD-10-CM | POA: Diagnosis not present

## 2019-08-07 DIAGNOSIS — Z95810 Presence of automatic (implantable) cardiac defibrillator: Secondary | ICD-10-CM

## 2019-08-08 NOTE — Progress Notes (Signed)
EPIC Encounter for ICM Monitoring  Patient Name: [name] is a 44 y.o. female Date: 08/08/2019 Primary Care Physican: Hoyt Koch, MD Primary [place] Electrophysiologist: Caryl Comes Last Weight:214lbs (office weight)  Transmission reviewed.  08/06/2019 [facility] HF Index 2 suggesting within normal limits.   Prescribed:Torsemide20 mgTake 2 tablets (40 mg total) by mouthtwice a day. Potassium 20 mEq take 2 tablets(40 mEq total)daily. Can take extra Torsemide as needed and Dr Haroldine Laws has discussed sliding scale.  Labs: 05/05/2019 Creatinine 0.78, BUN 7, Potassium 3.4, Sodium 139, GFR >60 04/19/2019 Creatinine 0.80, BUN 6, Potassium 3.4, Sodium 141, GFR >60 04/12/2019 Creatinine 0.77, BUN 8, Potassium 3.2, Sodium 137, GFR >60 A complete set of results can be found in Results Review  Recommendations:None  Follow-up plan: ICM clinic phone appointment on 09/11/2019.   91 day device clinic remote transmission 09/19/2019.          Copy of ICM check sent to Dr. Caryl Comes.  3 Month Trend    8 Day Data Trend          [name], RN 08/08/2019 4:18 PM

## 2019-08-16 ENCOUNTER — Ambulatory Visit: Payer: HMO

## 2019-08-16 ENCOUNTER — Other Ambulatory Visit: Payer: Self-pay

## 2019-08-16 ENCOUNTER — Encounter: Payer: Self-pay | Admitting: Internal Medicine

## 2019-08-16 NOTE — Patient Outreach (Signed)
  [facility]) Care Management Chronic Special Needs Program    08/16/2019  Name: LEOTA BATTA, DOB: [birth year]  MRN: [id number]   Ms. Hypatia Keniston is enrolled in a chronic special needs plan for Heart Failure. RNCM called to assist client with completing health risk assessment and update individualized care plan. No answer. HIPAA compliant message left. 2nd outreach attempt.  Plan: Chronic care management coordinator will attempt outreach within 1-2 weeks.  Thea Silversmith, RN, MSN, [place] [phone]

## 2019-08-17 ENCOUNTER — Ambulatory Visit (INDEPENDENT_AMBULATORY_CARE_PROVIDER_SITE_OTHER): Payer: HMO | Admitting: Internal Medicine

## 2019-08-17 ENCOUNTER — Encounter: Payer: Self-pay | Admitting: Internal Medicine

## 2019-08-17 DIAGNOSIS — R05 Cough: Secondary | ICD-10-CM

## 2019-08-17 DIAGNOSIS — R062 Wheezing: Secondary | ICD-10-CM | POA: Diagnosis not present

## 2019-08-17 DIAGNOSIS — E119 Type 2 diabetes mellitus without complications: Secondary | ICD-10-CM

## 2019-08-17 DIAGNOSIS — R059 Cough, unspecified: Secondary | ICD-10-CM

## 2019-08-17 MED ORDER — HYDROCODONE-HOMATROPINE 5-1.5 MG/5ML PO SYRP: 5.0000 mL | ORAL_SOLUTION | Freq: Four times a day (QID) | ORAL | 0 refills | Status: AC | PRN

## 2019-08-17 MED ORDER — PREDNISONE 10 MG PO TABS: ORAL_TABLET | ORAL | 0 refills | Status: DC

## 2019-08-17 MED ORDER — ALBUTEROL SULFATE HFA 108 (90 BASE) MCG/ACT IN AERS: 1.0000 | INHALATION_SPRAY | Freq: Four times a day (QID) | RESPIRATORY_TRACT | 5 refills | Status: DC | PRN

## 2019-08-17 MED ORDER — HYDROCODONE-HOMATROPINE 5-1.5 MG/5ML PO SYRP: 5.0000 mL | ORAL_SOLUTION | Freq: Four times a day (QID) | ORAL | 0 refills | Status: DC | PRN

## 2019-08-17 MED ORDER — DOXYCYCLINE HYCLATE 100 MG PO TABS: 100.0000 mg | ORAL_TABLET | Freq: Two times a day (BID) | ORAL | 0 refills | Status: DC

## 2019-08-17 NOTE — Patient Instructions (Signed)
Please take all new medication as prescribed - the antibiotic, cough medicine, and prednisone  Please continue all other medications as before, and refills have been done if requested - the inhaler  Please have the pharmacy call with any other refills you may need.  Please keep your appointments with your specialists as you may have planned     

## 2019-08-17 NOTE — Assessment & Plan Note (Signed)
Mild to mod, for predpac asd, inhaler refill,  to f/u any worsening symptoms or concerns

## 2019-08-17 NOTE — Assessment & Plan Note (Addendum)
Mild to mod, c/w bronchitis vs pna, delcines cxr, for antibx course, cough med prn, to f/u any worsening symptoms or concerns  I spent 31 minutes in preparing to see the patient by review of recent labs, imaging and procedures, obtaining and reviewing separately obtained history, communicating with the patient and family or caregiver, ordering medications, tests or procedures, and documenting clinical information in the EHR including the differential Dx, treatment, and any further evaluation and other management of cough, wheezing, DM

## 2019-08-17 NOTE — Progress Notes (Signed)
Patient ID: [name], female   DOB: Apr 18, 1976, 44 y.o.   MRN: [id number]  Virtual Visit via Video Note  I connected with [name] on 08/17/19 at  2:20 PM EDT by a video enabled telemedicine application and verified that I am speaking with the correct person using two identifiers.  Location: Patient: at home Provider: at office   I discussed the limitations of evaluation and management by telemedicine and the availability of in person appointments. The patient expressed understanding and agreed to proceed.  History of Present Illness: Here with acute onset mild to mod 2-3 days ST, HA, general weakness and malaise, with prod cough greenish sputum, but Pt denies chest pain, increased sob or doe, wheezing, orthopnea, PND, increased LE swelling, palpitations, dizziness or syncope, except for mild wheezing in the past day,  Out of inhaler but helped with the last doses.  Extra torsemide no help.  Covid neg mar 11.  No sick family contacts   Pt denies polydipsia, polyuria Past Medical History:  Diagnosis Date  . Acute on chronic systolic (congestive) heart failure ([place]) 04/29/2017  . Acute pain of right shoulder 06/16/2017  . AICD (automatic cardioverter/defibrillator) present   . Anxiety   . Arthritis    right shoulder   . Asthma   . CHF (congestive heart failure) ([place])   . Chronic combined systolic and diastolic heart failure ([place]) 03/05/2014  . Closed low lateral malleolus fracture 10/23/2013  . Cystitis 10/21/2017  . Depression   . Depression with anxiety 01/20/2013  . Diabetes mellitus without complication ([place])   . Diverticulosis   . Dyspnea    comes and goes intermittently mostly with exertion   . Essential hypertension    Prev followed by [facility]/ Cardiology   . Fibroid    age 70  . Gallstones   . Generalized abdominal cramping   . History of cardiomyopathy   . Hypertension   . IBS (irritable bowel syndrome)   . ICD (implantable cardioverter-defibrillator), single, in  situ 12/14/2016  . Insomnia 05/07/2017  . Labral tear of shoulder 04/04/2015    Injected 04/04/2015 Injected 12/03/2015   . Migraine    "monthly" (08/03/2016)  . Myofascial pain 06/16/2017  . NICM (nonischemic cardiomyopathy) ([place]) 08/03/2016  . Nonallopathic lesion of lumbosacral region 11/16/2016  . Nonallopathic lesion of sacral region 11/16/2016  . Nonallopathic lesion of thoracic region 08/20/2014  . Nonspecific chest pain 04/28/2017  . OSA (obstructive sleep apnea) 01/02/2013   NPSG 2009:  AHI 9/hr. CPAP intolerance >> "smothering" Good tolerance of auto device (optimal pressure 12-13 on download).  - referred to Dr Gwenette Greet    . OSA on CPAP   . Ovarian cyst    1999; surgically removed  . Patellofemoral syndrome of both knees 10/16/2016  . 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 PVC (premature ventricular contraction) 06/23/2016  . Seizures ([place])    "as a child" (08/03/2016)  . Termination of pregnancy    due to cardiac risk   Past Surgical History:  Procedure Laterality Date  . CARDIAC CATHETERIZATION N/A 11/11/2015   Procedure: Right/Left Heart Cath and Coronary Angiography;  Surgeon: Troy Sine, MD;  Location: [place] [facility];  Service: Cardiovascular;  Laterality: N/A;  . CARDIAC CATHETERIZATION  ~ 2015  . CARDIAC DEFIBRILLATOR PLACEMENT  08/03/2016  . CESAREAN SECTION  1999  . COLONOSCOPY WITH PROPOFOL N/A 04/21/2016   Procedure: COLONOSCOPY WITH PROPOFOL;  Surgeon: Jerene Bears, MD;  Location: [facility];  Service: Gastroenterology;  Laterality: N/A;  . ESOPHAGOGASTRODUODENOSCOPY (EGD) WITH PROPOFOL N/A 04/21/2016   Procedure: ESOPHAGOGASTRODUODENOSCOPY (EGD) WITH PROPOFOL;  Surgeon: Jerene Bears, MD;  Location: [facility];  Service: Gastroenterology;  Laterality: N/A;  . FOOT FRACTURE SURGERY Right ~ 2003  . FRACTURE SURGERY    . ICD IMPLANT N/A 08/03/2016   Procedure: ICD Implant;  Surgeon: Deboraha Sprang, MD;  Location: [place] [facility];   Service: Cardiovascular;  Laterality: N/A;  . LAPAROSCOPIC CHOLECYSTECTOMY  12/2006  . LAPAROSCOPIC GASTRIC SLEEVE RESECTION    . LAPAROSCOPY ABDOMEN DIAGNOSTIC  2008   "cut bile duct w/gallbladder OR; had to go in later & fix leak; hospitalized for 2 months"  . LEFT HEART CATHETERIZATION WITH CORONARY ANGIOGRAM N/A 02/26/2014   Procedure: LEFT HEART CATHETERIZATION WITH CORONARY ANGIOGRAM;  Surgeon: Jettie Booze, MD;  Location: [facility] CATH LAB;  Service: Cardiovascular;  Laterality: N/A;  . OVARIAN CYST REMOVAL Right 1999  . PVC ABLATION N/A 12/01/2017   Procedure: PVC ABLATION;  Surgeon: Evans Lance, MD;  Location: [place] [facility];  Service: Cardiovascular;  Laterality: N/A;  . RIGHT HEART CATH N/A 08/05/2017   Procedure: RIGHT HEART CATH;  Surgeon: Jolaine Artist, MD;  Location: [place] [facility];  Service: Cardiovascular;  Laterality: N/A;  . TUBAL LIGATION  1999    reports that she has never smoked. She has never used smokeless tobacco. She reports that she does not drink alcohol or use drugs. family history includes Allergies in her daughter; Emphysema in her maternal grandmother; Heart disease (age of onset: 74) in her maternal grandmother; Rheum arthritis in her mother. Allergies  Allergen Reactions  . Aspirin Anaphylaxis and Other (See Comments)    Wheezing, "but patient still takes if she needs to"  . Vancomycin Other (See Comments)    "did something to my kidneys," PROGRESSED TO KIDNEY FAILURE!!  . Contrast Media [Iodinated Diagnostic Agents] Other (See Comments)    Multiple CT contrast studies done over 2 weeks caused ARF  . Avocado Itching and Swelling  . Ciprofloxacin Itching and Rash  . Sulfa Antibiotics Itching and Rash   Current Outpatient Medications on File Prior to Visit  Medication Sig Dispense Refill  . amiodarone (PACERONE) 200 MG tablet Take 1 tablet by mouth once daily 30 tablet 5  . butalbital-acetaminophen-caffeine (FIORICET) 50-325-40 MG tablet  TAKE 1 TO 2 TABLETS BY MOUTH EVERY 6 HOURS AS NEEDED FOR HEADACHE 30 tablet 0  . carvedilol (COREG) 6.25 MG tablet Take 1 tablet (6.25 mg total) by mouth 2 (two) times daily. 60 tablet 11  . colestipol (COLESTID) 1 g tablet Take 2 tablets (2 g total) by mouth 2 (two) times daily. 120 tablet 1  . dapagliflozin propanediol (FARXIGA) 10 MG TABS tablet Take 10 mg by mouth daily before breakfast. 30 tablet 11  . diclofenac sodium (VOLTAREN) 1 % GEL Apply 4 g topically 4 (four) times daily. 300 g 6  . Diclofenac Sodium 2 % SOLN Place 2 g onto the skin 2 (two) times daily. 112 g 3  . dicyclomine (BENTYL) 20 MG tablet Take 20 mg by mouth 2 (two) times daily as needed for spasms.    . fluticasone (FLONASE) 50 MCG/ACT nasal spray Place 2 sprays into both nostrils daily as needed for allergies. 48 g 0  . gabapentin (NEURONTIN) 100 MG capsule Take 200 mg by mouth at bedtime as needed (for pain).     . Ibuprofen-Famotidine 800-26.6 MG  TABS Take 1 tablet by mouth 3 (three) times daily as needed. 90 tablet 3  . ivabradine (CORLANOR) 7.5 MG TABS tablet TAKE 1 TABLET (7.5 MG TOTAL) BY MOUTH 2 (TWO) TIMES DAILY WITH A MEAL. 180 tablet 2  . lidocaine (LIDODERM) 5 % Place 1 patch onto the skin daily. Remove & Discard patch within 12 hours or as directed by MD 30 patch 6  . linaclotide (LINZESS) 145 MCG CAPS capsule Take 1 capsule (145 mcg total) by mouth daily before breakfast. 30 capsule 3  . Multiple Vitamins-Minerals (MULTIVITAMIN GUMMIES WOMENS PO) Take 2 tablets by mouth daily.     . nitroGLYCERIN (NITROSTAT) 0.4 MG SL tablet Place 1 tablet (0.4 mg total) under the tongue every 5 (five) minutes as needed for chest pain. 75 tablet 0  . omeprazole (PRILOSEC) 20 MG capsule Take 20 mg by mouth daily as needed (for reflux symptoms).     . potassium chloride (KLOR-CON) 20 MEQ packet Take 60 mEq by mouth daily. 90 packet 3  . sacubitril-valsartan (ENTRESTO) 97-103 MG Take 1 tablet by mouth 2 (two) times daily. 60 tablet  6  . spironolactone (ALDACTONE) 50 MG tablet Take 1 tablet (50 mg total) by mouth daily. 30 tablet 6  . SYMBICORT 160-4.5 MCG/ACT inhaler INHALE 2 PUFFS INTO THE LUNGS 2 (TWO) TIMES DAILY AS NEEDED. 30.6 Inhaler 3  . tiZANidine (ZANAFLEX) 4 MG tablet Take 4 mg by mouth every 8 (eight) hours as needed for muscle spasms.     Marland Kitchen torsemide (DEMADEX) 20 MG tablet Take 2 tablets (40 mg total) by mouth 2 (two) times daily. 190 tablet 3  . Vitamin D, Ergocalciferol, (DRISDOL) 1.25 MG (50000 UT) CAPS capsule Take 1 capsule (50,000 Units total) by mouth every 7 (seven) days. 12 capsule 0  . zolpidem (AMBIEN) 5 MG tablet Take 1 tablet (5 mg total) by mouth at bedtime as needed for sleep. 30 tablet 3   No current facility-administered medications on file prior to visit.    Observations/Objective: Alert, NAD, appropriate mood and affect, resps normal, cn 2-12 intact, moves all 4s, no visible rash or swelling Lab Results  Component Value Date   WBC 8.0 04/12/2019   HGB 10.8 (L) 04/12/2019   HCT 35.2 (L) 04/12/2019   PLT 210 04/12/2019   GLUCOSE 90 05/05/2019   CHOL 160 11/09/2015   TRIG 70 11/09/2015   HDL 58 11/09/2015   LDLCALC 88 11/09/2015   ALT 11 04/19/2019   AST 16 04/19/2019   NA 139 05/05/2019   K 3.4 (L) 05/05/2019   CL 107 05/05/2019   CREATININE 0.78 05/05/2019   BUN 7 05/05/2019   CO2 26 05/05/2019   TSH 2.387 04/19/2019   INR 1.04 08/05/2017   HGBA1C 5.3 05/28/2016   Assessment and Plan: See notes  Follow Up Instructions: See notes   I discussed the assessment and treatment plan with the patient. The patient was provided an opportunity to ask questions and all were answered. The patient agreed with the plan and demonstrated an understanding of the instructions.   The patient was advised to call back or seek an in-person evaluation if the symptoms worsen or if the condition fails to improve as anticipated   Cathlean Cower, MD

## 2019-08-17 NOTE — Assessment & Plan Note (Signed)
stable overall by history and exam, recent data reviewed with pt, and pt to continue medical treatment as before,  to f/u any worsening symptoms or concerns  

## 2019-08-23 ENCOUNTER — Other Ambulatory Visit: Payer: Self-pay

## 2019-08-23 NOTE — Patient Outreach (Signed)
[place] [facility]) Care Management Chronic Special Needs Program  08/23/2019  Name: [name] DOB: 1975-11-23  MRN: [id number]  [name] is enrolled in a chronic special needs plan for Heart Failure. Chronic Care Management Coordinator telephoned client to assist client in completing health risk assessment and to update individualized care plan.  Introduced the chronic care management program, importance of client participation, and taking their care plan to all provider appointments and inpatient facilities.    Subjective: client reports history of HD,HF, HTN. She reports recent virtual visit with primary care saw Dr. Eyvonne Mechanic). She reports she has not been feeling well and has been coughing a lot. She reports is being treated for upper respiratory infection. Client reports she is feeling a little better, but remains tired and not feeling like doing much. PHQ2=4, PHQ9=15. Client states "It's because I have not been feeling well". She reports she has started feeling better since starting on antibiotics, steroids and inhaler receommended by primary care provider. She declines social work referral and states will call if she needs further assistance or resources.  history of heart failure. Client reports she weights every day and states her weights have not changed. She denies any increased shortness of breath or edema. Client continues to be monitored remotely for detection of increase fluid volume. Client denies any signs/symptoms of heart failure exacerbation.   Goals Addressed            This Visit's Progress   .  Acknowledge receipt of Advanced Directive package       Client does not want this as a goal and request to discontinue this goal.    . Advanced Care Planning complete as directed by client..       Client does not want this as a goal and request to discontinue this goal.    . COMPLETED: Client understands the importance of follow-up with providers by  attending scheduled visits       Client reports she follow up with providers as scheduled; voiced for follow up, medications.  Last visit virtual visit with primary care provider on 08/07/2019    . Client will report no worsening of symptoms related to heart disease within the next 6 months   On track    Denies any worsening of heart disease. Last office visit with electrophysiology on 05/04/2020, last visit with advanced heart failure clinic on 05/11/2019.  Goal renewed 2021: Please continue to attend provider visits as scheduled. Please continue to take medications as scheduled.     . Client will verbalize knowledge of Heart Failure disease self management skills within the next 6 months. (pt-stated)   On track    Discussed signs/symptoms of exacerbation. Know signs and symptoms of congestive heart failure exacerbation. Know when to call the doctor. Verbalize how fluid intake can affect congestive heart failure.-per provider limit all fluids to the day to less than 2 liters. Eat healthy and monitor salt intake-per provider limit salt to 2000 mg per day Visit your primary care or cardiologist as scheduled. Weight daily and record. Verbalize how daily weights can help with management of heart failure. Please take your medications as scheduled. Mailed education: "low salt diet". Please review and call if you have any questions.    . Client will verbalize knowledge of self management of Hypertension as evidences by BP reading of 140/90 or less; or as defined by provider   On track    Follow up with your doctor as scheduled. Take  your medications as prescribed by your doctor. Ask your doctor "what is my target blood pressure range". Monitor your blood pressure and take results to your doctor's appointment.  Monitor the amount of salt you are eating. Continue to exercise as tolerated and remain active. Eat heart healthy diet (full of fruits, vegetables, whole grains, lean protein,  water--limit salt, fat, and sugar/simple carb intake). Mailed education: "High blood pressure: what you can do" please review and call if you have any questions.    . COMPLETED: Maintain timely refills of Heart Failure medication as prescribed within the year        Client denies any difficulty obtaining medications.    . Obtain annual  Lipid Profile, LDL-C   On track    Client reports this was done 2020.  Goal continued 2021 It is important to follow up with your provider for recommended test.  Try to avoid saturated fats, trans-fats, and eat more fiber. Mailed education: "Lipid test" please review and call if you have any questions.    . Patient Stated: "feel better"   On track    Discussed your mood and the role your acute illness may be having. You are encouraged to contact your provider if the medications used to treat your upper respiratory infection are not working and/or if you do not feel any better. Please call your doctor for follow up as needed. Please call your CSNP care coordinator [phone]) if you need assistance or would like to learn about resources in the area. Mailed education: "coping with a health condition: signs of depression; coping with a health condition: what you can do". Please review and call if you need any assistance.    . Visit Primary Care Provider or Cardiologist at least 2 times per year   On track    It is important to follow up with provider as scheduled for recommended labs, procedures, medication refills.      Discussed covid 15 precautions/vaccination-RNCM encouraged client to continue to wear face mask, wait at least 6 feet from others and handwashing.   RNCM reinforced the 24 hour nurse advice line is available for health questions/concerns after hours. Client encouraged to call health care concierge for benefits questions. RNCM encouraged client to call RNCM as needed.  Client asked about obtaining a pulse oximeter. RNCM discussed the OTC  benefit with client. Client was not aware of the benefit. Warm transfer completed to healthcare concierge for benefit question.  Tier level decreased to tier 2. Initially assigned tier 3 due to client stating overwhelmed due to heart condition. Client states this is no longer an issue. Client is attending provider visits as scheduled and knows when to call the doctor and who to call. She denies any issues with medications and denies being overwhelmed.  RMCM called Primary care provider's office to inform of client's PHQ2 and PHQ 9 scores.  Plan: RNCM will send updated care plan to client, send updated care plan to primary care provider; send educational material. RNCM will scheduled next outreach based on tier level within 6 months.    Thea Silversmith, RN, MSN, [place] [phone]

## 2019-08-24 ENCOUNTER — Telehealth: Payer: Self-pay | Admitting: Internal Medicine

## 2019-08-24 NOTE — Telephone Encounter (Signed)
Denton Brick from [facility] called and spoke with the patient and did the assessment PHQ2 score of 4 and PHQ9 score was 15. Said that the patient has not been feeling well and not been able to do the things that she usually does. Said the patient said she was starting to feel better in terms of her upper respiratory symptoms.

## 2019-09-04 NOTE — Telephone Encounter (Signed)
Patient has an appointment.

## 2019-09-05 ENCOUNTER — Ambulatory Visit: Payer: HMO

## 2019-09-05 ENCOUNTER — Other Ambulatory Visit: Payer: Self-pay

## 2019-09-05 ENCOUNTER — Ambulatory Visit (INDEPENDENT_AMBULATORY_CARE_PROVIDER_SITE_OTHER): Payer: HMO | Admitting: Nurse Practitioner

## 2019-09-05 ENCOUNTER — Encounter (HOSPITAL_COMMUNITY): Payer: Self-pay

## 2019-09-05 ENCOUNTER — Encounter: Payer: Self-pay | Admitting: Internal Medicine

## 2019-09-05 ENCOUNTER — Ambulatory Visit (INDEPENDENT_AMBULATORY_CARE_PROVIDER_SITE_OTHER): Payer: HMO | Admitting: Internal Medicine

## 2019-09-05 ENCOUNTER — Other Ambulatory Visit (HOSPITAL_COMMUNITY): Payer: Self-pay | Admitting: *Deleted

## 2019-09-05 VITALS — BP 118/82 | HR 97 | Temp 97.1°F | Ht 66.0 in | Wt 217.2 lb

## 2019-09-05 DIAGNOSIS — R0602 Shortness of breath: Secondary | ICD-10-CM | POA: Diagnosis not present

## 2019-09-05 DIAGNOSIS — R059 Cough, unspecified: Secondary | ICD-10-CM

## 2019-09-05 DIAGNOSIS — Z8616 Personal history of COVID-19: Secondary | ICD-10-CM | POA: Diagnosis not present

## 2019-09-05 DIAGNOSIS — R05 Cough: Secondary | ICD-10-CM

## 2019-09-05 DIAGNOSIS — R062 Wheezing: Secondary | ICD-10-CM | POA: Diagnosis not present

## 2019-09-05 MED ORDER — HYDROCODONE-HOMATROPINE 5-1.5 MG/5ML PO SYRP: 5.0000 mL | ORAL_SOLUTION | Freq: Two times a day (BID) | ORAL | 0 refills | Status: DC | PRN

## 2019-09-05 MED ORDER — SACUBITRIL-VALSARTAN 97-103 MG PO TABS: 1.0000 | ORAL_TABLET | Freq: Two times a day (BID) | ORAL | 6 refills | Status: DC

## 2019-09-05 MED ORDER — NITROGLYCERIN 0.4 MG SL SUBL: 0.4000 mg | SUBLINGUAL_TABLET | SUBLINGUAL | 0 refills | Status: DC | PRN

## 2019-09-05 MED ORDER — ALBUTEROL SULFATE (2.5 MG/3ML) 0.083% IN NEBU: 2.5000 mg | INHALATION_SOLUTION | Freq: Four times a day (QID) | RESPIRATORY_TRACT | 1 refills | Status: DC | PRN

## 2019-09-05 MED ORDER — PREDNISONE 20 MG PO TABS: 40.0000 mg | ORAL_TABLET | Freq: Every day | ORAL | 0 refills | Status: DC

## 2019-09-05 NOTE — Assessment & Plan Note (Signed)
Unclear etiology at this time has failed antibiotics and steroids. Without prior covid-19 testing cannot bring her into office at this time. Will arrange visit at respiratory clinic for assessment, CXR, fluid status. Rx prednisone today and cough medicine.

## 2019-09-05 NOTE — Progress Notes (Signed)
@Patient  ID: [name], female    DOB: 04-Jan-1976, 44 y.o.   MRN: [id number]  Chief Complaint  Patient presents with  . Follow-up    Cough for the past 4 weeks, not getting better after medications and using inhalers    Referring provider: Hoyt Koch, *   44 year old female with history of HTN, CHF, migraines, PVC, asthma, OSA, IBS, Diabetes, Depression, anxiety, insomnia.   HPI   Patient presents for respiratory clinic visit.  She was seen through televisit on March 25 and on April 13 for ongoing shortness of breath and cough with wheezing.  She was given a round of doxycycline, prednisone, and cough medicine on March 25.  With minimal relief noted.  Patient states that is short of breath with exertion.  She states that her cough is nonproductive.  She has not been tested for Covid. She does have a history of asthma and just recently started back on Symbicort. She is using her albuterol inhaler frequently. She was prescribed prednisone and cough medication today from PCP. Her PCP wanted to have her seen here for physical evaluation. Denies f/c/s, n/v/d, hemoptysis, PND, chest pain or edema.         Allergies  Allergen Reactions  . Aspirin Anaphylaxis and Other (See Comments)    Wheezing, "but patient still takes if she needs to"  . Vancomycin Other (See Comments)    "did something to my kidneys," PROGRESSED TO KIDNEY FAILURE!!  . Contrast Media [Iodinated Diagnostic Agents] Other (See Comments)    Multiple CT contrast studies done over 2 weeks caused ARF  . Avocado Itching and Swelling  . Ciprofloxacin Itching and Rash  . Sulfa Antibiotics Itching and Rash    Immunization History  Administered Date(s) Administered  . Influenza Split 03/05/2013  . Influenza,inj,Quad PF,6+ Mos 01/17/2014, 01/30/2015  . Pneumococcal Polysaccharide-23 02/22/2014  . Tdap 02/15/2013    Past Medical History:  Diagnosis Date  . Acute on chronic systolic (congestive) heart  failure ([place]) 04/29/2017  . Acute pain of right shoulder 06/16/2017  . AICD (automatic cardioverter/defibrillator) present   . Anxiety   . Arthritis    right shoulder   . Asthma   . CHF (congestive heart failure) ([place])   . Chronic combined systolic and diastolic heart failure ([place]) 03/05/2014  . Closed low lateral malleolus fracture 10/23/2013  . Cystitis 10/21/2017  . Depression   . Depression with anxiety 01/20/2013  . Diabetes mellitus without complication ([place])   . Diverticulosis   . Dyspnea    comes and goes intermittently mostly with exertion   . Essential hypertension    Prev followed by [facility]/ Cardiology   . Fibroid    age 24  . Gallstones   . Generalized abdominal cramping   . History of cardiomyopathy   . Hypertension   . IBS (irritable bowel syndrome)   . 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 Insomnia 05/07/2017  . Labral tear of shoulder 04/04/2015    Injected 04/04/2015 Injected 12/03/2015   . Migraine    "monthly" (08/03/2016)  . Myofascial pain 06/16/2017  . NICM (nonischemic cardiomyopathy) ([place]) 08/03/2016  . Nonallopathic lesion of lumbosacral region 11/16/2016  . Nonallopathic lesion of sacral region 11/16/2016  . Nonallopathic lesion of thoracic region 08/20/2014  . Nonspecific chest pain 04/28/2017  . OSA (obstructive sleep apnea) 01/02/2013   NPSG 2009:  AHI 9/hr. CPAP intolerance >> "smothering" Good tolerance of auto device (optimal pressure 12-13 on  download).  - referred to Dr Gwenette Greet    . OSA on CPAP   . Ovarian cyst    1999; surgically removed  . Patellofemoral syndrome of both knees 10/16/2016  . 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 PVC (premature ventricular contraction) 06/23/2016  . Seizures ([place])    "as a child" (08/03/2016)  . Termination of pregnancy    due to cardiac risk    Tobacco History: Social History   Tobacco Use  Smoking Status Never Smoker  Smokeless Tobacco Never Used    Counseling given: Yes   Outpatient Encounter Medications as of 09/05/2019  Medication Sig  . albuterol (PROAIR HFA) 108 (90 Base) MCG/ACT inhaler Inhale 1-2 puffs into the lungs every 6 (six) hours as needed for wheezing or shortness of breath.  Marland Kitchen amiodarone (PACERONE) 200 MG tablet Take 1 tablet by mouth once daily  . carvedilol (COREG) 6.25 MG tablet Take 1 tablet (6.25 mg total) by mouth 2 (two) times daily.  . colestipol (COLESTID) 1 g tablet Take 2 tablets (2 g total) by mouth 2 (two) times daily.  . diclofenac sodium (VOLTAREN) 1 % GEL Apply 4 g topically 4 (four) times daily.  . Diclofenac Sodium 2 % SOLN Place 2 g onto the skin 2 (two) times daily.  Marland Kitchen dicyclomine (BENTYL) 20 MG tablet Take 20 mg by mouth 2 (two) times daily as needed for spasms.  . fluticasone (FLONASE) 50 MCG/ACT nasal spray Place 2 sprays into both nostrils daily as needed for allergies.  Marland Kitchen gabapentin (NEURONTIN) 100 MG capsule Take 200 mg by mouth at bedtime as needed (for pain).   . Ibuprofen-Famotidine 800-26.6 MG TABS Take 1 tablet by mouth 3 (three) times daily as needed.  . ivabradine (CORLANOR) 7.5 MG TABS tablet TAKE 1 TABLET (7.5 MG TOTAL) BY MOUTH 2 (TWO) TIMES DAILY WITH A MEAL.  Marland Kitchen lidocaine (LIDODERM) 5 % Place 1 patch onto the skin daily. Remove & Discard patch within 12 hours or as directed by MD  . Multiple Vitamins-Minerals (MULTIVITAMIN GUMMIES WOMENS PO) Take 2 tablets by mouth daily.   . nitroGLYCERIN (NITROSTAT) 0.4 MG SL tablet Place 1 tablet (0.4 mg total) under the tongue every 5 (five) minutes as needed for chest pain.  . potassium chloride (KLOR-CON) 20 MEQ packet Take 60 mEq by mouth daily.  . predniSONE (DELTASONE) 20 MG tablet Take 2 tablets (40 mg total) by mouth daily with breakfast.  . sacubitril-valsartan (ENTRESTO) 97-103 MG Take 1 tablet by mouth 2 (two) times daily.  Marland Kitchen spironolactone (ALDACTONE) 50 MG tablet Take 1 tablet (50 mg total) by mouth daily.  . SYMBICORT 160-4.5 MCG/ACT  inhaler INHALE 2 PUFFS INTO THE LUNGS 2 (TWO) TIMES DAILY AS NEEDED.  Marland Kitchen tiZANidine (ZANAFLEX) 4 MG tablet Take 4 mg by mouth every 8 (eight) hours as needed for muscle spasms.   Marland Kitchen torsemide (DEMADEX) 20 MG tablet Take 2 tablets (40 mg total) by mouth 2 (two) times daily.  . Vitamin D, Ergocalciferol, (DRISDOL) 1.25 MG (50000 UT) CAPS capsule Take 1 capsule (50,000 Units total) by mouth every 7 (seven) days.  Marland Kitchen zolpidem (AMBIEN) 5 MG tablet Take 1 tablet (5 mg total) by mouth at bedtime as needed for sleep.  Marland Kitchen albuterol (PROVENTIL) (2.5 MG/3ML) 0.083% nebulizer solution Take 3 mLs (2.5 mg total) by nebulization every 6 (six) hours as needed for wheezing or shortness of breath.  . butalbital-acetaminophen-caffeine (FIORICET) 50-325-40 MG tablet TAKE 1 TO 2 TABLETS BY MOUTH  EVERY 6 HOURS AS NEEDED FOR HEADACHE  . dapagliflozin propanediol (FARXIGA) 10 MG TABS tablet Take 10 mg by mouth daily before breakfast. (Patient not taking: Reported on 08/23/2019)  . HYDROcodone-homatropine (HYCODAN) 5-1.5 MG/5ML syrup Take 5-10 mLs by mouth 2 (two) times daily as needed for cough. (Patient not taking: Reported on 09/05/2019)  . linaclotide (LINZESS) 145 MCG CAPS capsule Take 1 capsule (145 mcg total) by mouth daily before breakfast. (Patient not taking: Reported on 08/23/2019)  . omeprazole (PRILOSEC) 20 MG capsule Take 20 mg by mouth daily as needed (for reflux symptoms).   . [EXPIRED] methylPREDNISolone acetate (DEPO-MEDROL) injection 80 mg    No facility-administered encounter medications on file as of 09/05/2019.     Review of Systems  Review of Systems  Constitutional: Negative.  Negative for activity change, chills and fever.  HENT: Negative.   Respiratory: Positive for cough, shortness of breath and wheezing.   Cardiovascular: Negative.  Negative for chest pain, palpitations and leg swelling.  Gastrointestinal: Negative.   Allergic/Immunologic: Negative.   Neurological: Negative.    Psychiatric/Behavioral: Negative.        Physical Exam  BP 118/82 (BP Location: Left Arm, Patient Position: Sitting, Cuff Size: Large)   Pulse 97   Temp (!) 97.1 F (36.2 C)   Ht 5\' 6"  (1.676 m)   Wt 217 lb 4 oz (98.5 kg)   SpO2 97%   BMI 35.07 kg/m   Wt Readings from Last 5 Encounters:  09/05/19 217 lb 4 oz (98.5 kg)  05/11/19 219 lb 12.8 oz (99.7 kg)  05/05/19 215 lb 3.2 oz (97.6 kg)  04/19/19 214 lb 6.4 oz (97.3 kg)  04/12/19 206 lb (93.4 kg)     Physical Exam Vitals and nursing note reviewed.  Constitutional:      General: She is not in acute distress.    Appearance: She is well-developed.  Cardiovascular:     Rate and Rhythm: Normal rate and regular rhythm.  Pulmonary:     Effort: Pulmonary effort is normal. No respiratory distress.     Breath sounds: Normal breath sounds. No wheezing or rhonchi.  Musculoskeletal:     Right lower leg: No edema.     Left lower leg: No edema.  Neurological:     Mental Status: She is alert and oriented to person, place, and time.        Assessment & Plan:   History of COVID-19 Shortness of breath:  Will give depo medrol in office today  Will order albuterol nebulizer  Please start prednisone as ordered by PCP tomorrow  Will check chest x ray  Will order labs   Continue Symbicort as directed   Cough: Continue gastroesophageal reflux disease treatment with elevating the head your bed and taking antacids Continue taking over-the-counter antihistamines and nasal fluticasone to help with allergic rhinitis You need to try to suppress your cough to allow your larynx (voice box) to heal.  For three days don't talk, laugh, sing, or clear your throat. Do everything you can to suppress the cough during this time. Use hard candies (sugarless Jolly Ranchers) or non-mint or non-menthol containing cough drops during this time to soothe your throat.  Use a cough suppressant (Delsym or what I have prescribed you) around the  clock during this time.  After three days, gradually increase the use of your voice and back off on the cough suppressants.       Fenton Foy, NP 09/06/2019

## 2019-09-05 NOTE — Patient Instructions (Addendum)
Shortness of breath:  Will give depo medrol in office today  Will order albuterol nebulizer  Please start prednisone as ordered by PCP tomorrow  Will check chest x ray  Will order labs   Continue Symbicort as directed   Cough: Continue gastroesophageal reflux disease treatment with elevating the head your bed and taking antacids Continue taking over-the-counter antihistamines and nasal fluticasone to help with allergic rhinitis You need to try to suppress your cough to allow your larynx (voice box) to heal.  For three days don't talk, laugh, sing, or clear your throat. Do everything you can to suppress the cough during this time. Use hard candies (sugarless Jolly Ranchers) or non-mint or non-menthol containing cough drops during this time to soothe your throat.  Use a cough suppressant (Delsym or what I have prescribed you) around the clock during this time.  After three days, gradually increase the use of your voice and back off on the cough suppressants.  Follow up:  Follow up in 1 week or sooner if needed

## 2019-09-05 NOTE — Progress Notes (Signed)
Virtual Visit via Video Note  I connected with [name] on 09/05/19 at  9:40 AM EDT by a video enabled telemedicine application and verified that I am speaking with the correct person using two identifiers.  The patient and the provider were at separate locations throughout the entire encounter.   I discussed the limitations of evaluation and management by telemedicine and the availability of in person appointments. The patient expressed understanding and agreed to proceed. The patient and the provider were the only parties present for the visit unless noted in HPI below.  History of Present Illness: The patient is a 44 y.o. female with visit for ongoing wheezing, SOB and cough. She was seen virtually for same on March 25th. Not advised for covid-19 testing but treated with doxycycline, prednisone and cough medicine. This did seem to help her symptoms while taking the medicine. The following day she started feeling bad again. She is having wheezing even with sitting. This is affecting her breathing and making walking even short distances difficult. She has continued to have cough and coughing spells. Denies significant sputum. Denies fevers or chills. Denies muscle aches or sinus drainage. She is using albuterol inhaler every 2 hours for SOB which is working for symptoms. Overall symptoms are not improving since onset close to 3 weeks ago. Has CHF history and denies significant fluid weight gain.   Observations/Objective: Appearance: normal, some cough during visit, breathing appears without dyspnea however recently used inhaler, casual grooming, abdomen does not appear distended, throat not well visualized, memory normal, mental status is A and O times 3  Assessment and Plan: See problem oriented charting  Follow Up Instructions: unable to safely assess via video visit, needs in person visit at respiratory clinic to assess for CXR, fluid status, lung exam. Rx prednisone and cough medicine in the  meantime  I discussed the assessment and treatment plan with the patient. The patient was provided an opportunity to ask questions and all were answered. The patient agreed with the plan and demonstrated an understanding of the instructions.   The patient was advised to call back or seek an in-person evaluation if the symptoms worsen or if the condition fails to improve as anticipated.  Hoyt Koch, MD

## 2019-09-06 DIAGNOSIS — Z8616 Personal history of COVID-19: Secondary | ICD-10-CM | POA: Diagnosis not present

## 2019-09-06 DIAGNOSIS — R062 Wheezing: Secondary | ICD-10-CM | POA: Diagnosis not present

## 2019-09-06 DIAGNOSIS — R0602 Shortness of breath: Secondary | ICD-10-CM

## 2019-09-06 DIAGNOSIS — R05 Cough: Secondary | ICD-10-CM

## 2019-09-06 MED ORDER — METHYLPREDNISOLONE ACETATE 80 MG/ML IJ SUSP
80.0000 mg | Freq: Once | INTRAMUSCULAR | Status: AC
  Administered 2019-09-06: 80 mg via INTRAMUSCULAR

## 2019-09-06 NOTE — Assessment & Plan Note (Signed)
Shortness of breath:  Will give depo medrol in office today  Will order albuterol nebulizer  Please start prednisone as ordered by PCP tomorrow  Will check chest x ray  Will order labs   Continue Symbicort as directed   Cough: Continue gastroesophageal reflux disease treatment with elevating the head your bed and taking antacids Continue taking over-the-counter antihistamines and nasal fluticasone to help with allergic rhinitis You need to try to suppress your cough to allow your larynx (voice box) to heal.  For three days don't talk, laugh, sing, or clear your throat. Do everything you can to suppress the cough during this time. Use hard candies (sugarless Jolly Ranchers) or non-mint or non-menthol containing cough drops during this time to soothe your throat.  Use a cough suppressant (Delsym or what I have prescribed you) around the clock during this time.  After three days, gradually increase the use of your voice and back off on the cough suppressants.

## 2019-09-07 ENCOUNTER — Encounter: Payer: Self-pay | Admitting: Internal Medicine

## 2019-09-07 ENCOUNTER — Other Ambulatory Visit: Payer: Self-pay

## 2019-09-07 ENCOUNTER — Ambulatory Visit (HOSPITAL_COMMUNITY)
Admission: RE | Admit: 2019-09-07 | Discharge: 2019-09-07 | Disposition: A | Discharge status: Home / Self Care | Payer: HMO | Source: Ambulatory Visit | Attending: Nurse Practitioner | Admitting: Nurse Practitioner

## 2019-09-07 DIAGNOSIS — R0602 Shortness of breath: Secondary | ICD-10-CM | POA: Diagnosis not present

## 2019-09-07 DIAGNOSIS — R062 Wheezing: Secondary | ICD-10-CM | POA: Diagnosis not present

## 2019-09-07 DIAGNOSIS — R05 Cough: Secondary | ICD-10-CM | POA: Diagnosis not present

## 2019-09-08 DIAGNOSIS — R05 Cough: Secondary | ICD-10-CM | POA: Diagnosis not present

## 2019-09-08 DIAGNOSIS — R062 Wheezing: Secondary | ICD-10-CM | POA: Diagnosis not present

## 2019-09-08 DIAGNOSIS — R0602 Shortness of breath: Secondary | ICD-10-CM | POA: Diagnosis not present

## 2019-09-08 DIAGNOSIS — U071 COVID-19: Secondary | ICD-10-CM | POA: Diagnosis not present

## 2019-09-08 LAB — COMPREHENSIVE METABOLIC PANEL
ALT: 9 IU/L (ref 0–32)
AST: 17 IU/L (ref 0–40)
Albumin/Globulin Ratio: 1.4 (ref 1.2–2.2)
Albumin: 4.2 g/dL (ref 3.8–4.8)
Alkaline Phosphatase: 101 IU/L (ref 39–117)
BUN/Creatinine Ratio: 12 (ref 9–23)
BUN: 9 mg/dL (ref 6–24)
Bilirubin Total: 0.4 mg/dL (ref 0.0–1.2)
CO2: 25 mmol/L (ref 20–29)
Calcium: 9.2 mg/dL (ref 8.7–10.2)
Chloride: 105 mmol/L (ref 96–106)
Creatinine, Ser: 0.74 mg/dL (ref 0.57–1.00)
GFR calc Af Amer: 113 mL/min/{1.73_m2} (ref >59)
GFR calc non Af Amer: 98 mL/min/{1.73_m2} (ref >59)
Globulin, Total: 3.1 g/dL (ref 1.5–4.5)
Glucose: 89 mg/dL (ref 65–99)
Potassium: 4.2 mmol/L (ref 3.5–5.2)
Sodium: 142 mmol/L (ref 134–144)
Total Protein: 7.3 g/dL (ref 6.0–8.5)

## 2019-09-08 LAB — SAR COV2 SEROLOGY (COVID19)AB(IGG),IA: DiaSorin SARS-CoV-2 Ab, IgG: NEGATIVE

## 2019-09-08 LAB — CBC
Hematocrit: 34.9 % (ref 34.0–46.6)
Hemoglobin: 10.6 g/dL — ABNORMAL LOW (ref 11.1–15.9)
MCH: 24.8 pg — ABNORMAL LOW (ref 26.6–33.0)
MCHC: 30.4 g/dL — ABNORMAL LOW (ref 31.5–35.7)
MCV: 82 fL (ref 79–97)
Platelets: 278 10*3/uL (ref 150–450)
RBC: 4.27 x10E6/uL (ref 3.77–5.28)
RDW: 14.7 % (ref 11.7–15.4)
WBC: 7.8 10*3/uL (ref 3.4–10.8)

## 2019-09-08 NOTE — Progress Notes (Signed)
Patient notified of results, verbally understood. No additional questions.

## 2019-09-11 ENCOUNTER — Ambulatory Visit (INDEPENDENT_AMBULATORY_CARE_PROVIDER_SITE_OTHER): Payer: HMO

## 2019-09-11 DIAGNOSIS — I5042 Chronic combined systolic (congestive) and diastolic (congestive) heart failure: Secondary | ICD-10-CM

## 2019-09-11 DIAGNOSIS — Z95810 Presence of automatic (implantable) cardiac defibrillator: Secondary | ICD-10-CM

## 2019-09-11 NOTE — Progress Notes (Signed)
EPIC Encounter for ICM Monitoring  Patient Name: [name] is a 44 y.o. female Date: 09/11/2019 Primary Care Physican: Hoyt Koch, MD Primary Cardiologist:Bensimhon Electrophysiologist: Caryl Comes 09/05/2019 OfficeWeight:217lbs  Attempted call to patient and unable to reach.  Left detailed message per DPR regarding transmission. Transmission reviewed.   09/11/2019 [facility] HF Index23 suggesting possible fluid accumulation starting approximately 09/04/2019.   Prescribed:  Torsemide20 mgTake 2 tablets (40 mg total) by mouthtwice a day. Can take extra Torsemide as needed and Dr Haroldine Laws has discussed sliding scale.  Potassium 20 mEq take 3 tablets(60 mEq total)daily.   Labs: 09/07/2019 Creatinine 0.74, BUN 9,   Potassium 4.2, Sodium 142, GFR 98-113 05/05/2019 Creatinine 0.78, BUN 7, Potassium 3.4, Sodium 139, GFR >60 04/19/2019 Creatinine 0.80, BUN 6, Potassium 3.4, Sodium 141, GFR >60 04/12/2019 Creatinine 0.77, BUN 8, Potassium 3.2, Sodium 137, GFR >60 A complete set of results can be found in Results Review  Recommendations:Left voice mail with ICM number and encouraged to call if experiencing any fluid symptoms.  Patient self adjusts Torsemide when needed.  Follow-up plan: ICM clinic phone appointment on4/27/2021 to recheck fluid levels. 91 day device clinic remote transmission 09/19/2019.   Copy of ICM check sent to Dr. Caryl Comes.  3 Month Trend    8 Day Data Trend          [name], RN 09/11/2019 3:54 PM

## 2019-09-12 ENCOUNTER — Ambulatory Visit: Payer: HMO

## 2019-09-13 ENCOUNTER — Ambulatory Visit (INDEPENDENT_AMBULATORY_CARE_PROVIDER_SITE_OTHER): Payer: HMO | Admitting: Nurse Practitioner

## 2019-09-13 ENCOUNTER — Other Ambulatory Visit: Payer: Self-pay

## 2019-09-13 VITALS — BP 130/64 | HR 94 | Temp 97.3°F | Ht 66.0 in | Wt 219.0 lb

## 2019-09-13 DIAGNOSIS — R05 Cough: Secondary | ICD-10-CM

## 2019-09-13 DIAGNOSIS — J45909 Unspecified asthma, uncomplicated: Secondary | ICD-10-CM | POA: Diagnosis not present

## 2019-09-13 DIAGNOSIS — R059 Cough, unspecified: Secondary | ICD-10-CM

## 2019-09-13 DIAGNOSIS — J45901 Unspecified asthma with (acute) exacerbation: Secondary | ICD-10-CM | POA: Diagnosis not present

## 2019-09-13 MED ORDER — MONTELUKAST SODIUM 10 MG PO TABS: 10.0000 mg | ORAL_TABLET | Freq: Every day | ORAL | 3 refills | Status: DC

## 2019-09-13 MED ORDER — PREDNISONE 10 MG PO TABS: 10.0000 mg | ORAL_TABLET | Freq: Every day | ORAL | 0 refills | Status: AC

## 2019-09-13 NOTE — Progress Notes (Signed)
@Patient  ID: [name], female    DOB: [birth year], 44 y.o.   MRN: [id number]  Chief Complaint  Patient presents with  . Follow-up    Patient states she is still having some cough and wheezing. Stated she is fine while taking prednisone which she finished about a day ago. Also felt the depo shot given at last OV helped alot. Also wanted to mention she had been using a humidifier for about 3 weeks right before her cough started.     Referring provider: Hoyt Koch, *  44 year old female with history of HTN, CHF, migraines, PVC, asthma, OSA, IBS, Diabetes, Depression, anxiety, insomnia.  Recent significant events:  08/17/19 PCP tele-visit: SOB, cough, wheeze - Prescribed doxycycline, prednisone, and cough medicine  09/05/19 [facility] Visit: Continues to have SOB, cough, wheeze. Chest xray albs ordered - clear. Albuterol nebulizer ordered, depo medrol given in office.   HPI  Patient presents for post Respiratory clinic follow-up.  Patient states that she is somewhat improved but is still complaining of cough and wheezing.  She states that recent prednisone and Depo-Medrol shot did help.  After she completed her course of prednisone she states that her cough and wheezing have gotten worse again.  Cough is nonproductive.  Chest x-ray at last visit was clear.  Patient is compliant with Symbicort and states that albuterol nebulizer is helping.  Patient has never been diagnosed with Covid.  Covid antibody test was negative at last visit.  Patient follows with cardiology for CHF.  She states that she has had more fluid noted recently and has been advised by cardiology to increase diuretic.  Denies f/c/s, n/v/d, hemoptysis, PND, chest pain or edema.      Allergies  Allergen Reactions  . Aspirin Anaphylaxis and Other (See Comments)    Wheezing, "but patient still takes if she needs to"  . Vancomycin Other (See Comments)    "did something to my kidneys," PROGRESSED TO  KIDNEY FAILURE!!  . Contrast Media [Iodinated Diagnostic Agents] Other (See Comments)    Multiple CT contrast studies done over 2 weeks caused ARF  . Avocado Itching and Swelling  . Ciprofloxacin Itching and Rash  . Sulfa Antibiotics Itching and Rash    Immunization History  Administered Date(s) Administered  . Influenza Split 03/05/2013  . Influenza,inj,Quad PF,6+ Mos 01/17/2014, 01/30/2015  . Pneumococcal Polysaccharide-23 02/22/2014  . Tdap 02/15/2013    Past Medical History:  Diagnosis Date  . Acute on chronic systolic (congestive) heart failure ([place]) 04/29/2017  . Acute pain of right shoulder 06/16/2017  . AICD (automatic cardioverter/defibrillator) present   . Anxiety   . Arthritis    right shoulder   . Asthma   . CHF (congestive heart failure) ([place])   . Chronic combined systolic and diastolic heart failure ([place]) 03/05/2014  . Closed low lateral malleolus fracture 10/23/2013  . Cystitis 10/21/2017  . Depression   . Depression with anxiety 01/20/2013  . Diabetes mellitus without complication ([place])   . Diverticulosis   . Dyspnea    comes and goes intermittently mostly with exertion   . Essential hypertension    Prev followed by [facility]/ Cardiology   . Fibroid    age 29  . Gallstones   . Generalized abdominal cramping   . History of cardiomyopathy   . Hypertension   . IBS (irritable bowel syndrome)   . 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 Insomnia 05/07/2017  . Labral tear  of shoulder 04/04/2015    Injected 04/04/2015 Injected 12/03/2015   . Migraine    "monthly" (08/03/2016)  . Myofascial pain 06/16/2017  . NICM (nonischemic cardiomyopathy) ([place]) 08/03/2016  . Nonallopathic lesion of lumbosacral region 11/16/2016  . Nonallopathic lesion of sacral region 11/16/2016  . Nonallopathic lesion of thoracic region 08/20/2014  . Nonspecific chest pain 04/28/2017  . OSA (obstructive sleep apnea) 01/02/2013   NPSG 2009:  AHI 9/hr. CPAP  intolerance >> "smothering" Good tolerance of auto device (optimal pressure 12-13 on download).  - referred to Dr Gwenette Greet    . OSA on CPAP   . Ovarian cyst    1999; surgically removed  . Patellofemoral syndrome of both knees 10/16/2016  . 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 PVC (premature ventricular contraction) 06/23/2016  . Seizures ([place])    "as a child" (08/03/2016)  . Termination of pregnancy    due to cardiac risk    Tobacco History: Social History   Tobacco Use  Smoking Status Never Smoker  Smokeless Tobacco Never Used   Counseling given: Not Answered   Outpatient Encounter Medications as of 09/13/2019  Medication Sig  . albuterol (PROAIR HFA) 108 (90 Base) MCG/ACT inhaler Inhale 1-2 puffs into the lungs every 6 (six) hours as needed for wheezing or shortness of breath.  Marland Kitchen albuterol (PROVENTIL) (2.5 MG/3ML) 0.083% nebulizer solution Take 3 mLs (2.5 mg total) by nebulization every 6 (six) hours as needed for wheezing or shortness of breath.  Marland Kitchen amiodarone (PACERONE) 200 MG tablet Take 1 tablet by mouth once daily  . butalbital-acetaminophen-caffeine (FIORICET) 50-325-40 MG tablet TAKE 1 TO 2 TABLETS BY MOUTH EVERY 6 HOURS AS NEEDED FOR HEADACHE  . carvedilol (COREG) 6.25 MG tablet Take 1 tablet (6.25 mg total) by mouth 2 (two) times daily.  . colestipol (COLESTID) 1 g tablet Take 2 tablets (2 g total) by mouth 2 (two) times daily.  . diclofenac sodium (VOLTAREN) 1 % GEL Apply 4 g topically 4 (four) times daily.  . Diclofenac Sodium 2 % SOLN Place 2 g onto the skin 2 (two) times daily.  Marland Kitchen dicyclomine (BENTYL) 20 MG tablet Take 20 mg by mouth 2 (two) times daily as needed for spasms.  . fluticasone (FLONASE) 50 MCG/ACT nasal spray Place 2 sprays into both nostrils daily as needed for allergies.  Marland Kitchen gabapentin (NEURONTIN) 100 MG capsule Take 200 mg by mouth at bedtime as needed (for pain).   . Ibuprofen-Famotidine 800-26.6 MG TABS Take 1 tablet by mouth 3  (three) times daily as needed.  . ivabradine (CORLANOR) 7.5 MG TABS tablet TAKE 1 TABLET (7.5 MG TOTAL) BY MOUTH 2 (TWO) TIMES DAILY WITH A MEAL.  Marland Kitchen lidocaine (LIDODERM) 5 % Place 1 patch onto the skin daily. Remove & Discard patch within 12 hours or as directed by MD  . Multiple Vitamins-Minerals (MULTIVITAMIN GUMMIES WOMENS PO) Take 2 tablets by mouth daily.   . nitroGLYCERIN (NITROSTAT) 0.4 MG SL tablet Place 1 tablet (0.4 mg total) under the tongue every 5 (five) minutes as needed for chest pain.  . potassium chloride (KLOR-CON) 20 MEQ packet Take 60 mEq by mouth daily.  . sacubitril-valsartan (ENTRESTO) 97-103 MG Take 1 tablet by mouth 2 (two) times daily.  Marland Kitchen spironolactone (ALDACTONE) 50 MG tablet Take 1 tablet (50 mg total) by mouth daily.  . SYMBICORT 160-4.5 MCG/ACT inhaler INHALE 2 PUFFS INTO THE LUNGS 2 (TWO) TIMES DAILY AS NEEDED.  Marland Kitchen tiZANidine (ZANAFLEX) 4 MG tablet Take  4 mg by mouth every 8 (eight) hours as needed for muscle spasms.   Marland Kitchen torsemide (DEMADEX) 20 MG tablet Take 2 tablets (40 mg total) by mouth 2 (two) times daily.  . Vitamin D, Ergocalciferol, (DRISDOL) 1.25 MG (50000 UT) CAPS capsule Take 1 capsule (50,000 Units total) by mouth every 7 (seven) days.  Marland Kitchen zolpidem (AMBIEN) 5 MG tablet Take 1 tablet (5 mg total) by mouth at bedtime as needed for sleep.  . [DISCONTINUED] predniSONE (DELTASONE) 20 MG tablet Take 2 tablets (40 mg total) by mouth daily with breakfast.  . dapagliflozin propanediol (FARXIGA) 10 MG TABS tablet Take 10 mg by mouth daily before breakfast. (Patient not taking: Reported on 08/23/2019)  . HYDROcodone-homatropine (HYCODAN) 5-1.5 MG/5ML syrup Take 5-10 mLs by mouth 2 (two) times daily as needed for cough. (Patient not taking: Reported on 09/05/2019)  . linaclotide (LINZESS) 145 MCG CAPS capsule Take 1 capsule (145 mcg total) by mouth daily before breakfast. (Patient not taking: Reported on 08/23/2019)  . montelukast (SINGULAIR) 10 MG tablet Take 1 tablet (10  mg total) by mouth at bedtime.  Marland Kitchen omeprazole (PRILOSEC) 20 MG capsule Take 20 mg by mouth daily as needed (for reflux symptoms).   . predniSONE (DELTASONE) 10 MG tablet Take 1 tablet (10 mg total) by mouth daily with breakfast for 5 days.   No facility-administered encounter medications on file as of 09/13/2019.     Review of Systems  Review of Systems  Constitutional: Negative.  Negative for chills and fever.  HENT: Negative.   Respiratory: Positive for cough, shortness of breath and wheezing.   Cardiovascular: Negative.  Negative for chest pain, palpitations and leg swelling.  Gastrointestinal: Negative.   Allergic/Immunologic: Negative.   Neurological: Negative.   Psychiatric/Behavioral: Negative.        Physical Exam  BP 130/64 (BP Location: Left Arm, Patient Position: Sitting, Cuff Size: Large)   Pulse 94   Temp (!) 97.3 F (36.3 C)   Ht 5\' 6"  (1.676 m)   Wt 219 lb (99.3 kg)   LMP 08/27/2019   SpO2 98%   BMI 35.35 kg/m   Wt Readings from Last 5 Encounters:  09/13/19 219 lb (99.3 kg)  09/05/19 217 lb 4 oz (98.5 kg)  05/11/19 219 lb 12.8 oz (99.7 kg)  05/05/19 215 lb 3.2 oz (97.6 kg)  04/19/19 214 lb 6.4 oz (97.3 kg)     Physical Exam Vitals and nursing note reviewed.  Constitutional:      General: She is not in acute distress.    Appearance: She is well-developed.  Cardiovascular:     Rate and Rhythm: Normal rate and regular rhythm.  Pulmonary:     Effort: Pulmonary effort is normal. No respiratory distress.     Breath sounds: Normal breath sounds. No wheezing or rhonchi.  Neurological:     Mental Status: She is alert and oriented to person, place, and time.      Lab Results:  CBC    Component Value Date/Time   WBC 7.8 09/07/2019 1159   WBC 8.0 04/12/2019 2308   RBC 4.27 09/07/2019 1159   RBC 4.24 04/12/2019 2308   HGB 10.6 (L) 09/07/2019 1159   HCT 34.9 09/07/2019 1159   PLT 278 09/07/2019 1159   MCV 82 09/07/2019 1159   MCH 24.8 (L)  09/07/2019 1159   MCH 25.5 (L) 04/12/2019 2308   MCHC 30.4 (L) 09/07/2019 1159   MCHC 30.7 04/12/2019 2308   RDW 14.7 09/07/2019 1159  LYMPHSABS 2.6 07/30/2016 1544   MONOABS 0.4 05/28/2016 1202   EOSABS 0.0 07/30/2016 1544   BASOSABS 0.0 07/30/2016 1544    BMET    Component Value Date/Time   NA 142 09/07/2019 1159   K 4.2 09/07/2019 1159   CL 105 09/07/2019 1159   CO2 25 09/07/2019 1159   GLUCOSE 89 09/07/2019 1159   GLUCOSE 90 05/05/2019 1155   BUN 9 09/07/2019 1159   CREATININE 0.74 09/07/2019 1159   CREATININE 0.90 03/26/2016 1536   CALCIUM 9.2 09/07/2019 1159   GFRNONAA 98 09/07/2019 1159   GFRNONAA >89 01/18/2014 1621   GFRAA 113 09/07/2019 1159   GFRAA >89 01/18/2014 1621    BNP    Component Value Date/Time   BNP 395.9 (H) 05/19/2018 1254    Imaging: DG Chest 2 View  Result Date: 09/07/2019 CLINICAL DATA:  Cough EXAM: CHEST - 2 VIEW COMPARISON:  04/12/2019 FINDINGS: Left-sided single lead ICD as before. Borderline cardiomegaly. No focal opacity, pleural effusion or pneumothorax. Degenerative changes of the spine. IMPRESSION: No active cardiopulmonary disease. Electronically Signed   By: Donavan Foil M.D.   On: 09/07/2019 22:16     Assessment & Plan:   Asthma with acute exacerbation History of Asthma Shortness of breath:  Slow to resolve  Will order Singulair  Will order prednisone  Will place referral to pulmonary  Continue albuterol nebulizer  Continue Symbicort as directed   Cough: Continue gastroesophageal reflux disease treatment with elevating the head your bed and taking antacids Continue taking over-the-counter antihistamines and nasal fluticasone to help with allergic rhinitis You need to try to suppress your cough to allow your larynx (voice box) to heal. For three days don't talk, laugh, sing, or clear your throat. Do everything you can to suppress the cough during this time. Use hard candies (sugarless Jolly Ranchers) or  non-mint or non-menthol containing cough drops during this time to soothe your throat. Use a cough suppressant (Delsym or what I have prescribed you) around the clock during this time. After three days, gradually increase the use of your voice and back off on the cough suppressants.  Follow up as needed      Fenton Foy, NP 09/14/2019

## 2019-09-13 NOTE — Patient Instructions (Addendum)
History of Asthma Shortness of breath:  Will order Singulair  Will order prednisone  Will place referral to pulmonary  Continue albuterol nebulizer  Continue Symbicort as directed   Cough: Continue gastroesophageal reflux disease treatment with elevating the head your bed and taking antacids Continue taking over-the-counter antihistamines and nasal fluticasone to help with allergic rhinitis You need to try to suppress your cough to allow your larynx (voice box) to heal. For three days don't talk, laugh, sing, or clear your throat. Do everything you can to suppress the cough during this time. Use hard candies (sugarless Jolly Ranchers) or non-mint or non-menthol containing cough drops during this time to soothe your throat. Use a cough suppressant (Delsym or what I have prescribed you) around the clock during this time. After three days, gradually increase the use of your voice and back off on the cough suppressants.  Follow up as needed

## 2019-09-14 DIAGNOSIS — J45901 Unspecified asthma with (acute) exacerbation: Secondary | ICD-10-CM | POA: Insufficient documentation

## 2019-09-14 NOTE — Assessment & Plan Note (Signed)
History of Asthma Shortness of breath:  Slow to resolve  Will order Singulair  Will order prednisone  Will place referral to pulmonary  Continue albuterol nebulizer  Continue Symbicort as directed   Cough: Continue gastroesophageal reflux disease treatment with elevating the head your bed and taking antacids Continue taking over-the-counter antihistamines and nasal fluticasone to help with allergic rhinitis You need to try to suppress your cough to allow your larynx (voice box) to heal. For three days don't talk, laugh, sing, or clear your throat. Do everything you can to suppress the cough during this time. Use hard candies (sugarless Jolly Ranchers) or non-mint or non-menthol containing cough drops during this time to soothe your throat. Use a cough suppressant (Delsym or what I have prescribed you) around the clock during this time. After three days, gradually increase the use of your voice and back off on the cough suppressants.  Follow up as needed

## 2019-09-19 ENCOUNTER — Ambulatory Visit (INDEPENDENT_AMBULATORY_CARE_PROVIDER_SITE_OTHER): Payer: HMO | Admitting: *Deleted

## 2019-09-19 ENCOUNTER — Ambulatory Visit (INDEPENDENT_AMBULATORY_CARE_PROVIDER_SITE_OTHER): Payer: HMO

## 2019-09-19 DIAGNOSIS — Z95810 Presence of automatic (implantable) cardiac defibrillator: Secondary | ICD-10-CM

## 2019-09-19 DIAGNOSIS — I5042 Chronic combined systolic (congestive) and diastolic (congestive) heart failure: Secondary | ICD-10-CM

## 2019-09-19 LAB — CUP PACEART REMOTE DEVICE CHECK
Battery Remaining Longevity: 150 mo
Battery Remaining Percentage: 100 %
Brady Statistic RV Percent Paced: 0 %
Date Time Interrogation Session: 20210427050400
HighPow Impedance: 62 Ohm
Implantable Lead Implant Date: 20180312
Implantable Lead Location: 753860
Implantable Lead Model: 293
Implantable Lead Serial Number: 422842
Implantable Pulse Generator Implant Date: 20180312
Lead Channel Impedance Value: 566 Ohm
Lead Channel Setting Pacing Amplitude: 2.5 V
Lead Channel Setting Pacing Pulse Width: 0.4 ms
Lead Channel Setting Sensing Sensitivity: 0.5 mV
Pulse Gen Serial Number: 226601

## 2019-09-19 NOTE — Progress Notes (Signed)
EPIC Encounter for ICM Monitoring  Patient Name: [name] is a 44 y.o. female Date: 09/19/2019 Primary Care Physican: Hoyt Koch, MD Primary Cardiologist:Bensimhon Electrophysiologist: Caryl Comes 09/05/2019 OfficeWeight:217lbs  Attempted call to patient and unable to reach.  Transmission reviewed.   4/26/2021HeartlogicHFIndex39 suggesting possible fluid accumulation starting approximately 09/04/2019.   Prescribed:  Torsemide20 mgTake 2 tablets (40 mg total) by mouthtwice a day. Can take extra Torsemide as needed and Dr Haroldine Laws has discussed sliding scale.  Potassium 20 mEq take 3 tablets(60 mEq total)daily.   Labs: 09/07/2019 Creatinine 0.74, BUN 9,   Potassium 4.2, Sodium 142, GFR 98-113 05/05/2019 Creatinine 0.78, BUN 7, Potassium 3.4, Sodium 139, GFR >60 04/19/2019 Creatinine 0.80, BUN 6, Potassium 3.4, Sodium 141, GFR >60 04/12/2019 Creatinine 0.77, BUN 8, Potassium 3.2, Sodium 137, GFR >60 A complete set of results can be found in Results Review  Recommendations:  Unable to reach.    Follow-up plan: ICM clinic phone appointment on 09/25/2019 to recheck fluid levels. 91 day device clinic remote transmission 12/19/2019.  Last Advanced HF clinic appointment was 04/19/2019.  Copy of ICM check sent to Dr. Caryl Comes.  3 Month Trend    8 Day Data Trend          [name], RN 09/19/2019 2:04 PM

## 2019-09-20 NOTE — Progress Notes (Signed)
ICD Remote  

## 2019-09-25 ENCOUNTER — Ambulatory Visit (INDEPENDENT_AMBULATORY_CARE_PROVIDER_SITE_OTHER): Payer: HMO

## 2019-09-25 DIAGNOSIS — Z95810 Presence of automatic (implantable) cardiac defibrillator: Secondary | ICD-10-CM

## 2019-09-25 DIAGNOSIS — I5042 Chronic combined systolic (congestive) and diastolic (congestive) heart failure: Secondary | ICD-10-CM

## 2019-09-25 NOTE — Progress Notes (Signed)
EPIC Encounter for ICM Monitoring  Patient Name: [name] is a 44 y.o. female Date: 09/25/2019 Primary Care Physican: Hoyt Koch, MD Primary Cardiologist:Bensimhon Electrophysiologist: Caryl Comes 09/05/2019 OfficeWeight:212-215lbs  Spoke with patient. She denies any fluid symptoms. Her right right foot occasionally swells but resolved by morning. She's had a persistent cough for the past month.  She has been treated with 3 courses of prednisione, inhalers and nubulizer which is helping.    04/26/2021HeartlogicHFIndex39suggesting possible fluid accumulation starting approximately 09/04/2019.  09/24/2019 HeartlogicHFIndexdecreased to 30suggesting improvement in fluid levels.   Prescribed:  Torsemide20 mgTake 2 tablets (40 mg total) by mouthtwice a day. Can take extra Torsemide as needed and Dr Haroldine Laws has discussed sliding scale.  Potassium 20 mEq take3tablets(60 mEq total)daily.   Labs: 09/07/2019 Creatinine 0.74, BUN 9, Potassium 4.2, Sodium 142, GFR 98-113 05/05/2019 Creatinine 0.78, BUN 7, Potassium 3.4, Sodium 139, GFR >60 04/19/2019 Creatinine 0.80, BUN 6, Potassium 3.4, Sodium 141, GFR >60 04/12/2019 Creatinine 0.77, BUN 8, Potassium 3.2, Sodium 137, GFR >60 A complete set of results can be found in Results Review  Recommendations:  Advised to call if she experiences any fluid symptoms.  Follow-up plan: ICM clinic phone appointment on 5/17/2021to recheck fluid levels. 91 day device clinic remote transmission 12/19/2019. Advised she is overdue to make an appointment with Dr Haroldine Laws and last visit was 04/19/2019.  She said she will call this week.  Copy of ICM check sent to Dr. Caryl Comes and Dr Haroldine Laws for review and will call back if any recommendations.   3 month ICM trend: 09/25/2019    1 Year ICM trend:       [name], RN 09/25/2019 2:02  PM

## 2019-10-05 ENCOUNTER — Encounter (HOSPITAL_COMMUNITY): Payer: Self-pay

## 2019-10-06 ENCOUNTER — Other Ambulatory Visit (HOSPITAL_COMMUNITY): Payer: Self-pay | Admitting: *Deleted

## 2019-10-06 MED ORDER — TORSEMIDE 20 MG PO TABS: 40.0000 mg | ORAL_TABLET | Freq: Two times a day (BID) | ORAL | 3 refills | Status: DC

## 2019-10-08 DIAGNOSIS — R05 Cough: Secondary | ICD-10-CM | POA: Diagnosis not present

## 2019-10-08 DIAGNOSIS — R062 Wheezing: Secondary | ICD-10-CM | POA: Diagnosis not present

## 2019-10-08 DIAGNOSIS — R0602 Shortness of breath: Secondary | ICD-10-CM | POA: Diagnosis not present

## 2019-10-08 DIAGNOSIS — U071 COVID-19: Secondary | ICD-10-CM | POA: Diagnosis not present

## 2019-10-09 ENCOUNTER — Ambulatory Visit (INDEPENDENT_AMBULATORY_CARE_PROVIDER_SITE_OTHER): Payer: HMO

## 2019-10-09 DIAGNOSIS — I5042 Chronic combined systolic (congestive) and diastolic (congestive) heart failure: Secondary | ICD-10-CM

## 2019-10-09 DIAGNOSIS — Z95810 Presence of automatic (implantable) cardiac defibrillator: Secondary | ICD-10-CM

## 2019-10-11 NOTE — Addendum Note (Signed)
Addended by: [name] on: 10/11/2019 09:26 AM   Modules accepted: Level of Service

## 2019-10-11 NOTE — Progress Notes (Signed)
EPIC Encounter for ICM Monitoring  Patient Name: [name] is a 44 y.o. female Date: 10/11/2019 Primary Care Physican: Hoyt Koch, MD Primary Cardiologist:Bensimhon Electrophysiologist: Caryl Comes 09/05/2019 OfficeWeight:212-215lbs  Transmission reviewed  10/08/2019 HeartlogicHFIndexdecreased to 26suggesting decrease in fluid accumulation.  09/24/2019 HeartlogicHFIndexdecreased to 30suggesting decrease in fluid accumulation.  04/26/2021HeartlogicHFIndex39suggesting possible fluid accumulation starting approximately 09/04/2019.  Prescribed:  Torsemide20 mgTake 2 tablets (40 mg total) by mouthtwice a day. Can take extra Torsemide as needed and Dr Haroldine Laws has discussed sliding scale.  Potassium 20 mEq take3tablets(60 mEq total)daily.   Labs: 09/07/2019 Creatinine 0.74, BUN 9, Potassium 4.2, Sodium 142, GFR 98-113 05/05/2019 Creatinine 0.78, BUN 7, Potassium 3.4, Sodium 139, GFR >60 04/19/2019 Creatinine 0.80, BUN 6, Potassium 3.4, Sodium 141, GFR >60 04/12/2019 Creatinine 0.77, BUN 8, Potassium 3.2, Sodium 137, GFR >60 A complete set of results can be found in Results Review  Recommendations:None - pt self adjusts Torsemide as needed  Follow-up plan: ICM clinic phone appointment on6/21/2021to recheck fluid levels. 91 day device clinic remote transmission7/27/2021.  Overdue to make an appointment with Dr Haroldine Laws and last visit was 04/19/2019.     Copy of ICM check sent to Dr. Caryl Comes and Dr Haroldine Laws   3 Month Trend    8 Day Data Trend                [name], RN 10/11/2019 9:17 AM

## 2019-10-17 ENCOUNTER — Telehealth (INDEPENDENT_AMBULATORY_CARE_PROVIDER_SITE_OTHER): Payer: HMO | Admitting: Internal Medicine

## 2019-10-17 ENCOUNTER — Encounter: Payer: Self-pay | Admitting: Internal Medicine

## 2019-10-17 DIAGNOSIS — R519 Headache, unspecified: Secondary | ICD-10-CM | POA: Diagnosis not present

## 2019-10-17 MED ORDER — UBRELVY 50 MG PO TABS: 50.0000 mg | ORAL_TABLET | ORAL | 2 refills | Status: DC | PRN

## 2019-10-17 NOTE — Progress Notes (Signed)
Virtual Visit via Video Note  I connected with [name] on 10/17/19 at  3:20 PM EDT by a video enabled telemedicine application and verified that I am speaking with the correct person using two identifiers.  The patient and the provider were at separate locations throughout the entire encounter. Patient location: home, Provider location: work   I discussed the limitations of evaluation and management by telemedicine and the availability of in person appointments. The patient expressed understanding and agreed to proceed. The patient and the provider were the only parties present for the visit unless noted in HPI below.  History of Present Illness: The patient is a 44 y.o. female with visit for migraine ongoing for 4 days. Started 4-5 days ago. Having sensitivity to light and sound. Mild nausea but no vomiting. Has no fevers or chills. Denies head injury. Denies chest pains. Denies fluid overload and has been urinating more than average lately. Overall it is not improving. Has tried fioricet which has not helped at all. Denies cough or SOB.   Observations/Objective: Appearance: normal, appears in pain, breathing appears normal, no cough or dyspnea during visit, casual grooming, abdomen does not appear distended, throat normal, memory normal, mental status is A and O times 3  Assessment and Plan: See problem oriented charting  Follow Up Instructions: given sample to pick up ubrelvy and rx ubrelvy for the migraine  I discussed the assessment and treatment plan with the patient. The patient was provided an opportunity to ask questions and all were answered. The patient agreed with the plan and demonstrated an understanding of the instructions.   The patient was advised to call back or seek an in-person evaluation if the symptoms worsen or if the condition fails to improve as anticipated.  Hoyt Koch, MD

## 2019-10-17 NOTE — Assessment & Plan Note (Signed)
Sample left at front desk for ubrelvy and rx sent to pharmacy for same. Would avoid triptans given cardiac history. Has tried her normal fioricet and this has not helped.

## 2019-10-18 ENCOUNTER — Ambulatory Visit (INDEPENDENT_AMBULATORY_CARE_PROVIDER_SITE_OTHER): Payer: HMO

## 2019-10-18 DIAGNOSIS — I5042 Chronic combined systolic (congestive) and diastolic (congestive) heart failure: Secondary | ICD-10-CM

## 2019-10-18 DIAGNOSIS — Z95810 Presence of automatic (implantable) cardiac defibrillator: Secondary | ICD-10-CM

## 2019-10-18 NOTE — Progress Notes (Signed)
EPIC Encounter for ICM Monitoring  Patient Name: [name] is a 44 y.o. female Date: 10/18/2019 Primary Care Physican: Hoyt Koch, MD Primary Cardiologist:Bensimhon Electrophysiologist: Caryl Comes 10/18/2019 Weight: 214 lbs  Spoke with patient.  She had a very severe Migraine for the past few days but is better today.  Reports hands and feet are very cold since April but does not have any swelling.  10/08/2019 HeartlogicHFIndexincreased to 34suggestingpossible fluid accumulation.   Prescribed:  Torsemide20 mgTake 2 tablets (40 mg total) by mouthtwice a day. Can take extra Torsemide as needed and Dr Haroldine Laws has discussed sliding scale.  Potassium 20 mEq take3tablets(60 mEq total)daily.   Spironolactone 50 mg take 1 tablet daily  Labs: 09/07/2019 Creatinine 0.74, BUN 9, Potassium 4.2, Sodium 142, GFR 98-113 A complete set of results can be found in Results Review  Recommendations: Advised to take Torsemide 60 mg twice a day x 2 days and then return to prescribed dosage.  Also advised to take 1 extra Potassium 20 mEq x 2 days with extra Torsemide   Follow-up plan: ICM clinic phone appointment on6/2/2021to recheck fluid levels. 91 day device clinic remote transmission7/27/2021.   Recommended patient call HF Clinic to schedule appt with Dr Haroldine Laws since last visitwas 04/19/2019.    Copy of ICM check sent to Dr. [name] Dr Haroldine Laws for review and any further recommendations if needed   3 Month Trend     8 Day Force, RN 10/18/2019 11:05 AM

## 2019-10-25 ENCOUNTER — Ambulatory Visit (INDEPENDENT_AMBULATORY_CARE_PROVIDER_SITE_OTHER): Payer: HMO

## 2019-10-25 DIAGNOSIS — I5042 Chronic combined systolic (congestive) and diastolic (congestive) heart failure: Secondary | ICD-10-CM | POA: Diagnosis not present

## 2019-10-25 DIAGNOSIS — Z95810 Presence of automatic (implantable) cardiac defibrillator: Secondary | ICD-10-CM

## 2019-10-27 ENCOUNTER — Telehealth: Payer: Self-pay

## 2019-10-27 NOTE — Progress Notes (Signed)
EPIC Encounter for ICM Monitoring  Patient Name: [name] is a 44 y.o. female Date: 10/27/2019 Primary Care Physican: Hoyt Koch, MD Primary Cardiologist:Bensimhon Electrophysiologist: Caryl Comes 10/18/2019 Weight: 214 lbs  Attempted call to patient and unable to reach.  Left detailed message per DPR regarding transmission. Transmission reviewed.   06/1/2021HeartlogicHFIndexdecreased to18 from 34 after taking extra Torsemide. Indexcontinues to suggest possible fluid accumulation but improving.   Prescribed:  Torsemide20 mgTake 2 tablets  (40 mg total) by mouthtwice a day. Can take extra Torsemide as needed and Dr Haroldine Laws has discussed sliding scale.  Potassium 20 mEq take3tablets(60 mEq total)daily.   Spironolactone 50 mg take 1 tablet daily  Labs: 09/07/2019 Creatinine 0.74, BUN 9, Potassium 4.2, Sodium 142, GFR 98-113 A complete set of results can be found in Results Review  Recommendations:  Left voice mail with ICM number and encouraged to call if experiencing any fluid symptoms.  Follow-up plan: ICM clinic phone appointment on6/21/2021to recheck fluid levels. 91 day device clinic remote transmission7/27/2021.   Patient aware that she needs to contact [facility] to schedule appt with Dr Haroldine Laws since last visitwas 04/19/2019.   Copy of ICM check sent to Dr. [name] Dr Haroldine Laws for review.  3 month ICM trend: 10/24/2019    1 Year ICM trend:       [name], RN 10/27/2019 10:21 AM

## 2019-10-27 NOTE — Telephone Encounter (Signed)
Remote ICM transmission received.  Attempted call to patient regarding ICM remote transmission and left detailed message per DPR.  Advised to return call for any fluid symptoms or questions. Next ICM remote transmission scheduled 11/13/2019.

## 2019-11-08 DIAGNOSIS — R062 Wheezing: Secondary | ICD-10-CM | POA: Diagnosis not present

## 2019-11-08 DIAGNOSIS — R05 Cough: Secondary | ICD-10-CM | POA: Diagnosis not present

## 2019-11-08 DIAGNOSIS — R0602 Shortness of breath: Secondary | ICD-10-CM | POA: Diagnosis not present

## 2019-11-08 DIAGNOSIS — U071 COVID-19: Secondary | ICD-10-CM | POA: Diagnosis not present

## 2019-11-12 ENCOUNTER — Inpatient Hospital Stay (HOSPITAL_COMMUNITY)
Admission: EM | Admit: 2019-11-12 | Discharge: 2019-11-17 | DRG: 287 | Disposition: A | Discharge status: Home / Self Care | Payer: HMO | Attending: Internal Medicine | Admitting: Internal Medicine

## 2019-11-12 ENCOUNTER — Other Ambulatory Visit: Payer: Self-pay

## 2019-11-12 ENCOUNTER — Encounter (HOSPITAL_COMMUNITY): Payer: Self-pay

## 2019-11-12 ENCOUNTER — Emergency Department (HOSPITAL_COMMUNITY): Payer: HMO

## 2019-11-12 DIAGNOSIS — I10 Essential (primary) hypertension: Secondary | ICD-10-CM | POA: Diagnosis present

## 2019-11-12 DIAGNOSIS — I493 Ventricular premature depolarization: Secondary | ICD-10-CM

## 2019-11-12 DIAGNOSIS — I5023 Acute on chronic systolic (congestive) heart failure: Secondary | ICD-10-CM | POA: Diagnosis not present

## 2019-11-12 DIAGNOSIS — Z8249 Family history of ischemic heart disease and other diseases of the circulatory system: Secondary | ICD-10-CM

## 2019-11-12 DIAGNOSIS — T502X5A Adverse effect of carbonic-anhydrase inhibitors, benzothiadiazides and other diuretics, initial encounter: Secondary | ICD-10-CM | POA: Diagnosis present

## 2019-11-12 DIAGNOSIS — R071 Chest pain on breathing: Secondary | ICD-10-CM

## 2019-11-12 DIAGNOSIS — R0789 Other chest pain: Secondary | ICD-10-CM | POA: Diagnosis present

## 2019-11-12 DIAGNOSIS — J45909 Unspecified asthma, uncomplicated: Secondary | ICD-10-CM | POA: Diagnosis present

## 2019-11-12 DIAGNOSIS — I5043 Acute on chronic combined systolic (congestive) and diastolic (congestive) heart failure: Secondary | ICD-10-CM

## 2019-11-12 DIAGNOSIS — I428 Other cardiomyopathies: Secondary | ICD-10-CM

## 2019-11-12 DIAGNOSIS — M1990 Unspecified osteoarthritis, unspecified site: Secondary | ICD-10-CM | POA: Diagnosis present

## 2019-11-12 DIAGNOSIS — R079 Chest pain, unspecified: Secondary | ICD-10-CM

## 2019-11-12 DIAGNOSIS — Z95810 Presence of automatic (implantable) cardiac defibrillator: Secondary | ICD-10-CM

## 2019-11-12 DIAGNOSIS — G4733 Obstructive sleep apnea (adult) (pediatric): Secondary | ICD-10-CM | POA: Diagnosis present

## 2019-11-12 DIAGNOSIS — G629 Polyneuropathy, unspecified: Secondary | ICD-10-CM | POA: Diagnosis present

## 2019-11-12 DIAGNOSIS — Z20822 Contact with and (suspected) exposure to covid-19: Secondary | ICD-10-CM | POA: Diagnosis present

## 2019-11-12 DIAGNOSIS — I300 Acute nonspecific idiopathic pericarditis: Secondary | ICD-10-CM

## 2019-11-12 DIAGNOSIS — Z9884 Bariatric surgery status: Secondary | ICD-10-CM

## 2019-11-12 DIAGNOSIS — G43909 Migraine, unspecified, not intractable, without status migrainosus: Secondary | ICD-10-CM | POA: Diagnosis present

## 2019-11-12 DIAGNOSIS — R05 Cough: Secondary | ICD-10-CM | POA: Diagnosis not present

## 2019-11-12 DIAGNOSIS — N179 Acute kidney failure, unspecified: Secondary | ICD-10-CM | POA: Diagnosis not present

## 2019-11-12 DIAGNOSIS — Z8261 Family history of arthritis: Secondary | ICD-10-CM | POA: Diagnosis not present

## 2019-11-12 DIAGNOSIS — Z6835 Body mass index (BMI) 35.0-35.9, adult: Secondary | ICD-10-CM

## 2019-11-12 DIAGNOSIS — I472 Ventricular tachycardia, unspecified: Secondary | ICD-10-CM

## 2019-11-12 DIAGNOSIS — E876 Hypokalemia: Secondary | ICD-10-CM | POA: Diagnosis not present

## 2019-11-12 DIAGNOSIS — K589 Irritable bowel syndrome without diarrhea: Secondary | ICD-10-CM | POA: Diagnosis present

## 2019-11-12 DIAGNOSIS — O94 Sequelae of complication of pregnancy, childbirth, and the puerperium: Secondary | ICD-10-CM

## 2019-11-12 DIAGNOSIS — E119 Type 2 diabetes mellitus without complications: Secondary | ICD-10-CM

## 2019-11-12 DIAGNOSIS — Z79899 Other long term (current) drug therapy: Secondary | ICD-10-CM | POA: Diagnosis not present

## 2019-11-12 DIAGNOSIS — Z825 Family history of asthma and other chronic lower respiratory diseases: Secondary | ICD-10-CM

## 2019-11-12 DIAGNOSIS — I5031 Acute diastolic (congestive) heart failure: Secondary | ICD-10-CM | POA: Diagnosis not present

## 2019-11-12 LAB — CBC
HCT: 32.1 % — ABNORMAL LOW (ref 36.0–46.0)
Hemoglobin: 9.6 g/dL — ABNORMAL LOW (ref 12.0–15.0)
MCH: 23.7 pg — ABNORMAL LOW (ref 26.0–34.0)
MCHC: 29.9 g/dL — ABNORMAL LOW (ref 30.0–36.0)
MCV: 79.3 fL — ABNORMAL LOW (ref 80.0–100.0)
Platelets: 212 10*3/uL (ref 150–400)
RBC: 4.05 MIL/uL (ref 3.87–5.11)
RDW: 15.5 % (ref 11.5–15.5)
WBC: 7.6 10*3/uL (ref 4.0–10.5)
nRBC: 0 % (ref 0.0–0.2)

## 2019-11-12 LAB — BASIC METABOLIC PANEL
Anion gap: 8 (ref 5–15)
BUN: 8 mg/dL (ref 6–20)
CO2: 23 mmol/L (ref 22–32)
Calcium: 9 mg/dL (ref 8.9–10.3)
Chloride: 107 mmol/L (ref 98–111)
Creatinine, Ser: 0.77 mg/dL (ref 0.44–1.00)
GFR calc Af Amer: >60 mL/min (ref >60)
GFR calc non Af Amer: >60 mL/min (ref >60)
Glucose, Bld: 120 mg/dL — ABNORMAL HIGH (ref 70–99)
Potassium: 3.5 mmol/L (ref 3.5–5.1)
Sodium: 138 mmol/L (ref 135–145)

## 2019-11-12 LAB — SARS CORONAVIRUS 2 BY RT PCR (HOSPITAL ORDER, PERFORMED IN ~~LOC~~ HOSPITAL LAB): SARS Coronavirus 2: NEGATIVE

## 2019-11-12 LAB — TSH: TSH: 1.722 u[IU]/mL (ref 0.350–4.500)

## 2019-11-12 LAB — BRAIN NATRIURETIC PEPTIDE: B Natriuretic Peptide: 754.6 pg/mL — ABNORMAL HIGH (ref 0.0–100.0)

## 2019-11-12 LAB — C-REACTIVE PROTEIN: CRP: 0.7 mg/dL (ref <1.0)

## 2019-11-12 LAB — SEDIMENTATION RATE: Sed Rate: 19 mm/hr (ref 0–22)

## 2019-11-12 LAB — I-STAT BETA HCG BLOOD, ED (MC, WL, AP ONLY): I-stat hCG, quantitative: <5 m[IU]/mL (ref <5)

## 2019-11-12 LAB — TROPONIN I (HIGH SENSITIVITY)
Troponin I (High Sensitivity): 4 ng/L (ref <18)
Troponin I (High Sensitivity): 5 ng/L (ref <18)

## 2019-11-12 LAB — D-DIMER, QUANTITATIVE: D-Dimer, Quant: 1.29 ug/mL-FEU — ABNORMAL HIGH (ref 0.00–0.50)

## 2019-11-12 MED ORDER — MONTELUKAST SODIUM 10 MG PO TABS
10.0000 mg | ORAL_TABLET | Freq: Every day | ORAL | Status: DC
  Administered 2019-11-13 – 2019-11-16 (×4): 10 mg via ORAL
  Filled 2019-11-12 (×4): qty 1

## 2019-11-12 MED ORDER — ENOXAPARIN SODIUM 40 MG/0.4ML ~~LOC~~ SOLN
40.0000 mg | SUBCUTANEOUS | Status: DC
  Administered 2019-11-13 – 2019-11-16 (×4): 40 mg via SUBCUTANEOUS
  Filled 2019-11-12 (×4): qty 0.4

## 2019-11-12 MED ORDER — HYDROMORPHONE HCL 1 MG/ML IJ SOLN
0.5000 mg | INTRAMUSCULAR | Status: DC | PRN
  Administered 2019-11-12 – 2019-11-14 (×5): 0.5 mg via INTRAVENOUS
  Administered 2019-11-14: 1 mg via INTRAVENOUS
  Administered 2019-11-14: 0.5 mg via INTRAVENOUS
  Administered 2019-11-15 – 2019-11-17 (×8): 1 mg via INTRAVENOUS
  Administered 2019-11-17: 0.5 mg via INTRAVENOUS
  Filled 2019-11-12: qty 1
  Filled 2019-11-12 (×2): qty 0.5
  Filled 2019-11-12 (×2): qty 1
  Filled 2019-11-12: qty 0.5
  Filled 2019-11-12: qty 1
  Filled 2019-11-12 (×3): qty 0.5
  Filled 2019-11-12 (×6): qty 1

## 2019-11-12 MED ORDER — SPIRONOLACTONE 25 MG PO TABS
50.0000 mg | ORAL_TABLET | Freq: Every day | ORAL | Status: DC
  Administered 2019-11-13 – 2019-11-14 (×2): 50 mg via ORAL
  Filled 2019-11-12 (×2): qty 2

## 2019-11-12 MED ORDER — GABAPENTIN 300 MG PO CAPS: 300.0000 mg | ORAL_CAPSULE | Freq: Every evening | ORAL | Status: DC | PRN

## 2019-11-12 MED ORDER — TIZANIDINE HCL 4 MG PO TABS
4.0000 mg | ORAL_TABLET | Freq: Three times a day (TID) | ORAL | Status: DC | PRN
  Administered 2019-11-14 – 2019-11-16 (×2): 4 mg via ORAL
  Filled 2019-11-12 (×2): qty 1

## 2019-11-12 MED ORDER — SACUBITRIL-VALSARTAN 97-103 MG PO TABS
1.0000 | ORAL_TABLET | Freq: Two times a day (BID) | ORAL | Status: DC
  Administered 2019-11-12: 1 via ORAL
  Filled 2019-11-12 (×2): qty 1

## 2019-11-12 MED ORDER — COLESTIPOL HCL 1 G PO TABS
2.0000 g | ORAL_TABLET | Freq: Every day | ORAL | Status: DC | PRN
  Filled 2019-11-12: qty 2

## 2019-11-12 MED ORDER — POTASSIUM CHLORIDE CRYS ER 10 MEQ PO TBCR
20.0000 meq | EXTENDED_RELEASE_TABLET | Freq: Two times a day (BID) | ORAL | Status: DC
  Administered 2019-11-12 – 2019-11-13 (×2): 20 meq via ORAL
  Filled 2019-11-12 (×4): qty 2

## 2019-11-12 MED ORDER — FLUTICASONE PROPIONATE 50 MCG/ACT NA SUSP
2.0000 | Freq: Every day | NASAL | Status: DC | PRN
  Filled 2019-11-12: qty 16

## 2019-11-12 MED ORDER — AMIODARONE HCL 200 MG PO TABS
200.0000 mg | ORAL_TABLET | Freq: Every day | ORAL | Status: DC
  Administered 2019-11-13 – 2019-11-17 (×5): 200 mg via ORAL
  Filled 2019-11-12 (×5): qty 1

## 2019-11-12 MED ORDER — IVABRADINE HCL 7.5 MG PO TABS
7.5000 mg | ORAL_TABLET | Freq: Every day | ORAL | Status: DC
  Administered 2019-11-13: 7.5 mg via ORAL
  Filled 2019-11-12: qty 1

## 2019-11-12 MED ORDER — FLUTICASONE FUROATE-VILANTEROL 200-25 MCG/INH IN AEPB
1.0000 | INHALATION_SPRAY | Freq: Every day | RESPIRATORY_TRACT | Status: DC
  Administered 2019-11-13 – 2019-11-17 (×5): 1 via RESPIRATORY_TRACT
  Filled 2019-11-12: qty 28

## 2019-11-12 MED ORDER — FUROSEMIDE 10 MG/ML IJ SOLN
40.0000 mg | Freq: Two times a day (BID) | INTRAMUSCULAR | Status: AC
  Administered 2019-11-12 – 2019-11-13 (×2): 40 mg via INTRAVENOUS
  Filled 2019-11-12 (×2): qty 4

## 2019-11-12 MED ORDER — PANTOPRAZOLE SODIUM 40 MG PO TBEC
40.0000 mg | DELAYED_RELEASE_TABLET | Freq: Every day | ORAL | Status: DC
  Administered 2019-11-12 – 2019-11-17 (×5): 40 mg via ORAL
  Filled 2019-11-12 (×5): qty 1

## 2019-11-12 MED ORDER — VITAMIN D (ERGOCALCIFEROL) 1.25 MG (50000 UNIT) PO CAPS
50000.0000 [IU] | ORAL_CAPSULE | ORAL | Status: DC
  Administered 2019-11-15: 50000 [IU] via ORAL
  Filled 2019-11-12: qty 1

## 2019-11-12 MED ORDER — DICYCLOMINE HCL 20 MG PO TABS
20.0000 mg | ORAL_TABLET | Freq: Every day | ORAL | Status: DC | PRN
  Filled 2019-11-12: qty 1

## 2019-11-12 MED ORDER — ADULT MULTIVITAMIN W/MINERALS CH
1.0000 | ORAL_TABLET | Freq: Every day | ORAL | Status: DC
  Filled 2019-11-12 (×3): qty 1

## 2019-11-12 MED ORDER — COLCHICINE 0.6 MG PO TABS
0.6000 mg | ORAL_TABLET | Freq: Two times a day (BID) | ORAL | Status: DC
  Administered 2019-11-12 – 2019-11-13 (×2): 0.6 mg via ORAL
  Filled 2019-11-12 (×2): qty 1

## 2019-11-12 MED ORDER — GABAPENTIN 100 MG PO CAPS: 100.0000 mg | ORAL_CAPSULE | Freq: Two times a day (BID) | ORAL | Status: DC | PRN

## 2019-11-12 MED ORDER — SODIUM CHLORIDE 0.9% FLUSH
3.0000 mL | Freq: Once | INTRAVENOUS | Status: AC
  Administered 2019-11-12: 3 mL via INTRAVENOUS

## 2019-11-12 MED ORDER — ACETAMINOPHEN 650 MG RE SUPP: 650.0000 mg | Freq: Four times a day (QID) | RECTAL | Status: DC | PRN

## 2019-11-12 MED ORDER — ACETAMINOPHEN 325 MG PO TABS: 650.0000 mg | ORAL_TABLET | Freq: Four times a day (QID) | ORAL | Status: DC | PRN

## 2019-11-12 MED ORDER — BUTALBITAL-APAP-CAFFEINE 50-325-40 MG PO TABS: 1.0000 | ORAL_TABLET | Freq: Four times a day (QID) | ORAL | Status: DC | PRN

## 2019-11-12 MED ORDER — CARVEDILOL 12.5 MG PO TABS
12.5000 mg | ORAL_TABLET | Freq: Two times a day (BID) | ORAL | Status: DC
  Administered 2019-11-12 – 2019-11-16 (×7): 12.5 mg via ORAL
  Filled 2019-11-12 (×6): qty 1
  Filled 2019-11-12: qty 4
  Filled 2019-11-12 (×2): qty 1

## 2019-11-12 MED ORDER — LIDOCAINE 5 % EX PTCH
1.0000 | MEDICATED_PATCH | Freq: Two times a day (BID) | CUTANEOUS | Status: DC | PRN
  Administered 2019-11-14: 1 via TRANSDERMAL
  Filled 2019-11-12: qty 1

## 2019-11-12 MED ORDER — UBROGEPANT 50 MG PO TABS: 50.0000 mg | ORAL_TABLET | ORAL | Status: DC

## 2019-11-12 MED ORDER — TORSEMIDE 20 MG PO TABS
40.0000 mg | ORAL_TABLET | Freq: Two times a day (BID) | ORAL | Status: DC
  Administered 2019-11-12 – 2019-11-14 (×3): 40 mg via ORAL
  Filled 2019-11-12 (×4): qty 2

## 2019-11-12 MED ORDER — DICLOFENAC SODIUM 1 % TD GEL: 4.0000 g | Freq: Four times a day (QID) | TRANSDERMAL | Status: DC | PRN

## 2019-11-12 MED ORDER — ALBUTEROL SULFATE (2.5 MG/3ML) 0.083% IN NEBU
2.5000 mg | INHALATION_SOLUTION | Freq: Four times a day (QID) | RESPIRATORY_TRACT | Status: DC | PRN
  Administered 2019-11-12 – 2019-11-13 (×3): 2.5 mg via RESPIRATORY_TRACT
  Filled 2019-11-12 (×3): qty 3

## 2019-11-12 NOTE — ED Provider Notes (Signed)
[facility] EMERGENCY DEPARTMENT Provider Note   CSN: [id number] Arrival date & time: 11/12/19  1231     History Chief Complaint  Patient presents with  . Cough  . Chest Pain    [name] is a 44 y.o. female.  44 year old female presents with cough, dyspnea, midsternal chest discomfort times a week.  Patient states that last night her symptoms became worse and that she feels as if there is a squeezing kind of sensation in her chest.  No fever or chills.  She has not had a Covid vaccine.  Pain is worse with taking a deep breath.  Denies any leg pain or swelling.  Symptoms are somewhat worse at night.  She does have a history of asthma and CHF and has used her inhaler without relief.  She has also been endorsing palpitations with near syncope.  No treatment used for this prior to arrival        Past Medical History:  Diagnosis Date  . Acute on chronic systolic (congestive) heart failure ([place]) 04/29/2017  . Acute pain of right shoulder 06/16/2017  . AICD (automatic cardioverter/defibrillator) present   . Anxiety   . Arthritis    right shoulder   . Asthma   . CHF (congestive heart failure) ([place])   . Chronic combined systolic and diastolic heart failure ([place]) 03/05/2014  . Closed low lateral malleolus fracture 10/23/2013  . Cystitis 10/21/2017  . Depression   . Depression with anxiety 01/20/2013  . Diabetes mellitus without complication ([place])   . Diverticulosis   . Dyspnea    comes and goes intermittently mostly with exertion   . Essential hypertension    Prev followed by [facility]/ Cardiology   . Fibroid    age 82  . Gallstones   . Generalized abdominal cramping   . History of cardiomyopathy   . Hypertension   . IBS (irritable bowel syndrome)   . 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 Insomnia 05/07/2017  . Labral tear of shoulder 04/04/2015    Injected 04/04/2015 Injected 12/03/2015   . Migraine    "monthly"  (08/03/2016)  . Myofascial pain 06/16/2017  . NICM (nonischemic cardiomyopathy) ([place]) 08/03/2016  . Nonallopathic lesion of lumbosacral region 11/16/2016  . Nonallopathic lesion of sacral region 11/16/2016  . Nonallopathic lesion of thoracic region 08/20/2014  . Nonspecific chest pain 04/28/2017  . OSA (obstructive sleep apnea) 01/02/2013   NPSG 2009:  AHI 9/hr. CPAP intolerance >> "smothering" Good tolerance of auto device (optimal pressure 12-13 on download).  - referred to Dr Gwenette Greet    . OSA on CPAP   . Ovarian cyst    1999; surgically removed  . Patellofemoral syndrome of both knees 10/16/2016  . 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 PVC (premature ventricular contraction) 06/23/2016  . Seizures ([place])    "as a child" (08/03/2016)  . Termination of pregnancy    due to cardiac risk    Patient Active Problem List   Diagnosis Date Noted  . Asthma with acute exacerbation 09/14/2019  . Shortness of breath 09/06/2019  . Wheezing 08/17/2019  . Left hip pain 03/21/2019  . Left lower quadrant abdominal pain 02/10/2019  . Headache 10/26/2018  . Left ankle pain 10/10/2018  . Lip swelling 09/12/2018  . Sore throat 07/21/2018  . Dysfunction of right eustachian tube 07/21/2018  . Whiplash injury syndrome, initial encounter 07/19/2018  . Chronic right-sided low back pain without sciatica 06/24/2018  .  Trigger point of shoulder region, right 06/13/2018  . Patellar subluxation, left, initial encounter 04/25/2018  . Left flank pain 01/14/2018  . Acute pain of right shoulder 06/16/2017  . Myofascial pain 06/16/2017  . Insomnia 05/07/2017  . History of cardiomyopathy   . Acute on chronic systolic (congestive) heart failure ([place]) 04/29/2017  . Nonspecific chest pain 04/28/2017  . 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 Nonallopathic lesion of lumbosacral region 11/16/2016  . Nonallopathic lesion of sacral region 11/16/2016  .  Patellofemoral syndrome of both knees 10/16/2016  . NICM (nonischemic cardiomyopathy) ([place]) 08/03/2016  . PVC (premature ventricular contraction) 06/23/2016  . Generalized abdominal cramping   . Diabetes mellitus without complication ([place])   . Labral tear of shoulder 04/04/2015  . 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 Nonallopathic lesion of rib cage 08/20/2014  . Nonallopathic lesion of cervical region 08/20/2014  . Nonallopathic lesion of thoracic region 08/20/2014  . Chronic combined systolic and diastolic heart failure ([place]) 03/05/2014  . Closed low lateral malleolus fracture 10/23/2013  . IBS (irritable bowel syndrome)   . Depression with anxiety 01/20/2013  . OSA (obstructive sleep apnea) 01/02/2013  . 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 Essential hypertension     Past Surgical History:  Procedure Laterality Date  . CARDIAC CATHETERIZATION N/A 11/11/2015   Procedure: Right/Left Heart Cath and Coronary Angiography;  Surgeon: Troy Sine, MD;  Location: [facility];  Service: Cardiovascular;  Laterality: N/A;  . CARDIAC CATHETERIZATION  ~ 2015  . CARDIAC DEFIBRILLATOR PLACEMENT  08/03/2016  . CESAREAN SECTION  1999  . COLONOSCOPY WITH PROPOFOL N/A 04/21/2016   Procedure: COLONOSCOPY WITH PROPOFOL;  Surgeon: Jerene Bears, MD;  Location: [facility];  Service: Gastroenterology;  Laterality: N/A;  . ESOPHAGOGASTRODUODENOSCOPY (EGD) WITH PROPOFOL N/A 04/21/2016   Procedure: ESOPHAGOGASTRODUODENOSCOPY (EGD) WITH PROPOFOL;  Surgeon: Jerene Bears, MD;  Location: [facility];  Service: Gastroenterology;  Laterality: N/A;  . FOOT FRACTURE SURGERY Right ~ 2003  . FRACTURE SURGERY    . ICD IMPLANT N/A 08/03/2016   Procedure: ICD Implant;  Surgeon: Deboraha Sprang, MD;  Location: [facility] LAB;  Service: Cardiovascular;  Laterality: N/A;  . LAPAROSCOPIC CHOLECYSTECTOMY  12/2006  . LAPAROSCOPIC GASTRIC SLEEVE RESECTION    . LAPAROSCOPY ABDOMEN DIAGNOSTIC  2008   "cut bile  duct w/gallbladder OR; had to go in later & fix leak; hospitalized for 2 months"  . LEFT HEART CATHETERIZATION WITH CORONARY ANGIOGRAM N/A 02/26/2014   Procedure: LEFT HEART CATHETERIZATION WITH CORONARY ANGIOGRAM;  Surgeon: Jettie Booze, MD;  Location: [facility] CATH LAB;  Service: Cardiovascular;  Laterality: N/A;  . OVARIAN CYST REMOVAL Right 1999  . PVC ABLATION N/A 12/01/2017   Procedure: PVC ABLATION;  Surgeon: Evans Lance, MD;  Location: [facility] CV LAB;  Service: Cardiovascular;  Laterality: N/A;  . RIGHT HEART CATH N/A 08/05/2017   Procedure: RIGHT HEART CATH;  Surgeon: Jolaine Artist, MD;  Location: [place] [facility] LAB;  Service: Cardiovascular;  Laterality: N/A;  . TUBAL LIGATION  1999     OB History    Gravida  2   Para  1   Term      Preterm      AB  1   Living  1     SAB      TAB  1   Ectopic      Multiple      Live Births  Family History  Problem Relation Age of Onset  . Emphysema Maternal Grandmother        smoked  . Heart disease Maternal Grandmother 26       MI  . Rheum arthritis Mother   . Allergies Daughter   . Colon cancer Neg Hx     Social History   Tobacco Use  . Smoking status: Never Smoker  . Smokeless tobacco: Never Used  Vaping Use  . Vaping Use: Never used  Substance Use Topics  . Alcohol use: No  . Drug use: No    Home Medications Prior to Admission medications   Medication Sig Start Date End Date Taking? Authorizing Provider  albuterol (PROAIR HFA) 108 (90 Base) MCG/ACT inhaler Inhale 1-2 puffs into the lungs every 6 (six) hours as needed for wheezing or shortness of breath. 08/17/19   Biagio Borg, MD  albuterol (PROVENTIL) (2.5 MG/3ML) 0.083% nebulizer solution Take 3 mLs (2.5 mg total) by nebulization every 6 (six) hours as needed for wheezing or shortness of breath. 09/05/19   Fenton Foy, NP  amiodarone (PACERONE) 200 MG tablet Take 1 tablet by mouth once daily 02/09/19   Larey Dresser, MD   butalbital-acetaminophen-caffeine (FIORICET) [phone] MG tablet TAKE 1 TO 2 TABLETS BY MOUTH EVERY 6 HOURS AS NEEDED FOR HEADACHE 12/05/18   Hoyt Koch, MD  carvedilol (COREG) 6.25 MG tablet Take 1 tablet (6.25 mg total) by mouth 2 (two) times daily. 05/11/19 05/10/20  Clegg, Amy D, NP  colestipol (COLESTID) 1 g tablet Take 2 tablets (2 g total) by mouth 2 (two) times daily. 02/10/19   Hoyt Koch, MD  dapagliflozin propanediol (FARXIGA) 10 MG TABS tablet Take 10 mg by mouth daily before breakfast. Patient not taking: Reported on 08/23/2019 05/11/19   Darrick Grinder D, NP  diclofenac sodium (VOLTAREN) 1 % GEL Apply 4 g topically 4 (four) times daily. 06/23/18   Hoyt Koch, MD  Diclofenac Sodium 2 % SOLN Place 2 g onto the skin 2 (two) times daily. 01/03/18   Lyndal Pulley, DO  dicyclomine (BENTYL) 20 MG tablet Take 20 mg by mouth 2 (two) times daily as needed for spasms.    [provider]  fluticasone (FLONASE) 50 MCG/ACT nasal spray Place 2 sprays into both nostrils daily as needed for allergies. 07/25/18   Hoyt Koch, MD  gabapentin (NEURONTIN) 100 MG capsule Take 200 mg by mouth at bedtime as needed (for pain).     [provider]  HYDROcodone-homatropine (HYCODAN) 5-1.5 MG/5ML syrup Take 5-10 mLs by mouth 2 (two) times daily as needed for cough. Patient not taking: Reported on 09/05/2019 09/05/19   Hoyt Koch, MD  Ibuprofen-Famotidine 800-26.6 MG TABS Take 1 tablet by mouth 3 (three) times daily as needed. 01/17/18   Lyndal Pulley, DO  ivabradine (CORLANOR) 7.5 MG TABS tablet TAKE 1 TABLET (7.5 MG TOTAL) BY MOUTH 2 (TWO) TIMES DAILY WITH A MEAL. 02/15/19   Bensimhon, Shaune Pascal, MD  lidocaine (LIDODERM) 5 % Place 1 patch onto the skin daily. Remove & Discard patch within 12 hours or as directed by MD 01/17/18   Hoyt Koch, MD  linaclotide [facility]) 145 MCG CAPS capsule Take 1 capsule (145 mcg total) by mouth daily before  breakfast. Patient not taking: Reported on 08/23/2019 10/19/17   Pyrtle, Lajuan Lines, MD  montelukast (SINGULAIR) 10 MG tablet Take 1 tablet (10 mg total) by mouth at bedtime. 09/13/19   Lazaro Arms  S, NP  Multiple Vitamins-Minerals (MULTIVITAMIN GUMMIES WOMENS PO) Take 2 tablets by mouth daily.     [provider]  nitroGLYCERIN (NITROSTAT) 0.4 MG SL tablet Place 1 tablet (0.4 mg total) under the tongue every 5 (five) minutes as needed for chest pain. 09/05/19   Bensimhon, Shaune Pascal, MD  omeprazole (PRILOSEC) 20 MG capsule Take 20 mg by mouth daily as needed (for reflux symptoms).  09/09/17 05/11/19  [provider]  potassium chloride (KLOR-CON) 20 MEQ packet Take 60 mEq by mouth daily. 05/10/19   Bensimhon, Shaune Pascal, MD  sacubitril-valsartan (ENTRESTO) 97-103 MG Take 1 tablet by mouth 2 (two) times daily. 09/05/19   Bensimhon, Shaune Pascal, MD  spironolactone (ALDACTONE) 50 MG tablet Take 1 tablet (50 mg total) by mouth daily. 05/10/19   Bensimhon, Shaune Pascal, MD  SYMBICORT 160-4.5 MCG/ACT inhaler INHALE 2 PUFFS INTO THE LUNGS 2 (TWO) TIMES DAILY AS NEEDED. 05/03/18   Lyndal Pulley, DO  tiZANidine (ZANAFLEX) 4 MG tablet Take 4 mg by mouth every 8 (eight) hours as needed for muscle spasms.  12/02/18   [provider]  torsemide (DEMADEX) 20 MG tablet Take 2 tablets (40 mg total) by mouth 2 (two) times daily. 10/06/19   Clegg, Amy D, NP  Ubrogepant (UBRELVY) 50 MG TABS Take 50 mg by mouth every 2 (two) hours as needed (migraine). 10/17/19   Hoyt Koch, MD  Vitamin D, Ergocalciferol, (DRISDOL) 1.25 MG (50000 UT) CAPS capsule Take 1 capsule (50,000 Units total) by mouth every 7 (seven) days. 08/01/18   Lyndal Pulley, DO  zolpidem (AMBIEN) 5 MG tablet Take 1 tablet (5 mg total) by mouth at bedtime as needed for sleep. 05/10/17   Hoyt Koch, MD    Allergies    Aspirin, Vancomycin, Contrast media [iodinated diagnostic agents], Avocado, Ciprofloxacin, and Sulfa  antibiotics  Review of Systems   Review of Systems  All other systems reviewed and are negative.   Physical Exam Updated Vital Signs BP (!) 124/97   Pulse 93   Temp 98.2 F (36.8 C) (Oral)   Resp (!) 24   Ht 1.676 m (5\' 6" )   Wt 100.2 kg   SpO2 98%   BMI 35.67 kg/m   Physical Exam Vitals and nursing note reviewed.  Constitutional:      General: She is not in acute distress.    Appearance: Normal appearance. She is well-developed. She is not toxic-appearing.  HENT:     Head: Normocephalic and atraumatic.  Eyes:     General: Lids are normal.     Conjunctiva/sclera: Conjunctivae normal.     Pupils: Pupils are equal, round, and reactive to light.  Neck:     Thyroid: No thyroid mass.     Trachea: No tracheal deviation.  Cardiovascular:     Rate and Rhythm: Normal rate and regular rhythm.     Heart sounds: Normal heart sounds. No murmur heard.  No gallop.   Pulmonary:     Effort: Pulmonary effort is normal. No respiratory distress.     Breath sounds: Normal breath sounds. No stridor. No decreased breath sounds, wheezing, rhonchi or rales.  Abdominal:     General: Bowel sounds are normal. There is no distension.     Palpations: Abdomen is soft.     Tenderness: There is no abdominal tenderness. There is no rebound.  Musculoskeletal:        General: No tenderness. Normal range of motion.     Cervical back:  Normal range of motion and neck supple.  Skin:    General: Skin is warm and dry.     Findings: No abrasion or rash.  Neurological:     Mental Status: She is alert and oriented to person, place, and time.     GCS: GCS eye subscore is 4. GCS verbal subscore is 5. GCS motor subscore is 6.     Cranial Nerves: No cranial nerve deficit.     Sensory: No sensory deficit.  Psychiatric:        Speech: Speech normal.        Behavior: Behavior normal.     ED Results / Procedures / Treatments   Labs (all labs ordered are listed, but only abnormal results are  displayed) Labs Reviewed  BASIC METABOLIC PANEL - Abnormal; Notable for the following components:      Result Value   Glucose, Bld 120 (*)    All other components within normal limits  CBC - Abnormal; Notable for the following components:   Hemoglobin 9.6 (*)    HCT 32.1 (*)    MCV 79.3 (*)    MCH 23.7 (*)    MCHC 29.9 (*)    All other components within normal limits  I-STAT BETA HCG BLOOD, ED ([facility], [facility], [facility] ONLY)  TROPONIN I (HIGH SENSITIVITY)  TROPONIN I (HIGH SENSITIVITY)    EKG   EKG Interpretation  Date/Time:  Sunday November 12 2019 16:31:14 EDT Ventricular Rate:  106 PR Interval:  164 QRS Duration: 97 QT Interval:  391 QTC Calculation: 482 R Axis:   10 Text Interpretation: Sinus tachycardia Paired ventricular premature complexes Anterior infarct, old Confirmed by Lacretia Leigh ([id number]) on 11/12/2019 5:05:42 PM         Radiology DG Chest 2 View  Result Date: 11/12/2019 CLINICAL DATA:  Chest pain and cough. EXAM: CHEST - 2 VIEW COMPARISON:  September 07, 2019 FINDINGS: Stable AICD device. The heart, hila, and mediastinum are normal. No pneumothorax. No nodules or masses. No focal infiltrates. IMPRESSION: No active cardiopulmonary disease. Electronically Signed   By: Dorise Bullion III M.D   On: 11/12/2019 13:31    Procedures Procedures (including critical care time)  Medications Ordered in ED Medications  sodium chloride flush (NS) 0.9 % injection 3 mL (has no administration in time range)    ED Course  I have reviewed the triage vital signs and the nursing notes.  Pertinent labs & imaging results that were available during my care of the patient were reviewed by me and considered in my medical decision making (see chart for details).    MDM Rules/Calculators/A&P                          [name] labs and x-rays reviewed here.  Monitor showed a great deal of ectopy.  Troponin and EKG are reassuring.  Chest x-ray without acute findings.  Patient seen by cardiology  recommends medicine admission and able work-up for pericarditis Final Clinical Impression(s) / ED Diagnoses Final diagnoses:  None    Rx / DC Orders ED Discharge Orders    None       Lacretia Leigh, MD 11/12/19 1725

## 2019-11-12 NOTE — ED Notes (Signed)
Pt up to bedside commode

## 2019-11-12 NOTE — H&P (Signed)
History and Physical    [name] LFY:[id number] DOB: [birth year] DOA: 11/12/2019  PCP: Hoyt Koch, MD (Confirm with patient/family/NH records and if not entered, this has to be entered at [facility] point of entry) Patient coming from: Home  I have personally briefly reviewed patient's old medical records in [place]  Chief Complaint: Chest pain  HPI: [name] is a 44 y.o. female with medical history significant of postpartum cardiomyopathy with systolic CHF with normal cath x2 in the past, status post AICD, frequent PVCs status post unsuccessful ablation, asthma, IBS, tented with new onset chest pain.  Symptoms started last night before he went to bed, sharp like, persistent chest pain, localized, intensity 8/10, worsened with lying down and deep breath, improved with sitting up which made her got up early this morning around 4 AM, associated with short of breath but no sweating.  She also has had more palpitations than usual.  She has had frequent PVCs for years had unsuccessful ablation couple years ago, and has been on beta-blocker and amiodarone since.  She occasionally feels short of breath associated with exertion, and she takes torsemide twice daily, meantime her AICD monitor intraventricular dynamic and remotely alarmed cardiology for additional as needed torsemide, which she has been doing very well volume status wise.  About 2-3 weeks ago patient was treated by PCP for URI symptoms of congestion and productive cough.  Symptoms resolved. ED Course: Monitoring and EKG shows frequent PVCs occasional degenerate into short run V-Tach.  Troponin hip.  X-ray negative for any acute infiltrates.  Review of Systems: As per HPI otherwise 10 point review of systems negative.    Past Medical History:  Diagnosis Date  . Acute on chronic systolic (congestive) heart failure ([place]) 04/29/2017  . Acute pain of right shoulder 06/16/2017  . AICD (automatic cardioverter/defibrillator)  present   . Anxiety   . Arthritis    right shoulder   . Asthma   . CHF (congestive heart failure) ([employer])   . Chronic combined systolic and diastolic heart failure ([place]) 03/05/2014  . Closed low lateral malleolus fracture 10/23/2013  . Cystitis 10/21/2017  . Depression   . Depression with anxiety 01/20/2013  . Diabetes mellitus without complication ([place])   . Diverticulosis   . Dyspnea    comes and goes intermittently mostly with exertion   . Essential hypertension    Prev followed by [facility]/ Cardiology   . Fibroid    age 41  . Gallstones   . Generalized abdominal cramping   . History of cardiomyopathy   . Hypertension   . IBS (irritable bowel syndrome)   . 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 Insomnia 05/07/2017  . Labral tear of shoulder 04/04/2015    Injected 04/04/2015 Injected 12/03/2015   . Migraine    "monthly" (08/03/2016)  . Myofascial pain 06/16/2017  . NICM (nonischemic cardiomyopathy) ([place]) 08/03/2016  . Nonallopathic lesion of lumbosacral region 11/16/2016  . Nonallopathic lesion of sacral region 11/16/2016  . Nonallopathic lesion of thoracic region 08/20/2014  . Nonspecific chest pain 04/28/2017  . OSA (obstructive sleep apnea) 01/02/2013   NPSG 2009:  AHI 9/hr. CPAP intolerance >> "smothering" Good tolerance of auto device (optimal pressure 12-13 on download).  - referred to Dr Gwenette Greet    . OSA on CPAP   . Ovarian cyst    1999; surgically removed  . Patellofemoral syndrome of both knees 10/16/2016  . Postpartum cardiomyopathy    developed after  1st pregnancy  . PVC (premature ventricular contraction) 06/23/2016  . Seizures ([place])    "as a child" (08/03/2016)  . Termination of pregnancy    due to cardiac risk    Past Surgical History:  Procedure Laterality Date  . CARDIAC CATHETERIZATION N/A 11/11/2015   Procedure: Right/Left Heart Cath and Coronary Angiography;  Surgeon: Troy Sine, MD;  Location: [place] [facility];  Service:  Cardiovascular;  Laterality: N/A;  . CARDIAC CATHETERIZATION  ~ 2015  . CARDIAC DEFIBRILLATOR PLACEMENT  08/03/2016  . CESAREAN SECTION  1999  . COLONOSCOPY WITH PROPOFOL N/A 04/21/2016   Procedure: COLONOSCOPY WITH PROPOFOL;  Surgeon: Jerene Bears, MD;  Location: [facility];  Service: Gastroenterology;  Laterality: N/A;  . ESOPHAGOGASTRODUODENOSCOPY (EGD) WITH PROPOFOL N/A 04/21/2016   Procedure: ESOPHAGOGASTRODUODENOSCOPY (EGD) WITH PROPOFOL;  Surgeon: Jerene Bears, MD;  Location: [facility];  Service: Gastroenterology;  Laterality: N/A;  . FOOT FRACTURE SURGERY Right ~ 2003  . FRACTURE SURGERY    . ICD IMPLANT N/A 08/03/2016   Procedure: ICD Implant;  Surgeon: Deboraha Sprang, MD;  Location: [place] [facility];  Service: Cardiovascular;  Laterality: N/A;  . LAPAROSCOPIC CHOLECYSTECTOMY  12/2006  . LAPAROSCOPIC GASTRIC SLEEVE RESECTION    . LAPAROSCOPY ABDOMEN DIAGNOSTIC  2008   "cut bile duct w/gallbladder OR; had to go in later & fix leak; hospitalized for 2 months"  . LEFT HEART CATHETERIZATION WITH CORONARY ANGIOGRAM N/A 02/26/2014   Procedure: LEFT HEART CATHETERIZATION WITH CORONARY ANGIOGRAM;  Surgeon: Jettie Booze, MD;  Location: [facility] CATH LAB;  Service: Cardiovascular;  Laterality: N/A;  . OVARIAN CYST REMOVAL Right 1999  . PVC ABLATION N/A 12/01/2017   Procedure: PVC ABLATION;  Surgeon: Evans Lance, MD;  Location: [place] [facility];  Service: Cardiovascular;  Laterality: N/A;  . RIGHT HEART CATH N/A 08/05/2017   Procedure: RIGHT HEART CATH;  Surgeon: Jolaine Artist, MD;  Location: [place] [facility];  Service: Cardiovascular;  Laterality: N/A;  . TUBAL LIGATION  1999     reports that she has never smoked. She has never used smokeless tobacco. She reports that she does not drink alcohol and does not use drugs.  Allergies  Allergen Reactions  . Aspirin Anaphylaxis and Other (See Comments)    Wheezing,  . Vancomycin Other (See Comments)    "did something to my  kidneys," PROGRESSED TO KIDNEY FAILURE!!  . Contrast Media [Iodinated Diagnostic Agents] Other (See Comments)    Multiple CT contrast studies done over 2 weeks caused ARF  . Avocado Itching and Swelling  . Ciprofloxacin Itching and Rash  . Sulfa Antibiotics Itching and Rash    Family History  Problem Relation Age of Onset  . Emphysema Maternal Grandmother        smoked  . Heart disease Maternal Grandmother 98       MI  . Rheum arthritis Mother   . Allergies Daughter   . Colon cancer Neg Hx      Prior to Admission medications   Medication Sig Start Date End Date Taking? Authorizing Provider  albuterol (PROAIR HFA) 108 (90 Base) MCG/ACT inhaler Inhale 1-2 puffs into the lungs every 6 (six) hours as needed for wheezing or shortness of breath. 08/17/19  Yes Biagio Borg, MD  albuterol (PROVENTIL) (2.5 MG/3ML) 0.083% nebulizer solution Take 3 mLs (2.5 mg total) by nebulization every 6 (six) hours as needed for wheezing or shortness of breath. 09/05/19  Yes Fenton Foy, NP  amiodarone (PACERONE) 200 MG tablet Take 1 tablet by mouth once daily Patient taking differently: Take 200 mg by mouth daily with lunch.  02/09/19  Yes Larey Dresser, MD  butalbital-acetaminophen-caffeine (FIORICET) 50-325-40 MG tablet TAKE 1 TO 2 TABLETS BY MOUTH EVERY 6 HOURS AS NEEDED FOR HEADACHE Patient taking differently: Take 1-2 tablets by mouth every 6 (six) hours as needed for headache or migraine.  12/05/18  Yes Hoyt Koch, MD  carvedilol (COREG) 6.25 MG tablet Take 1 tablet (6.25 mg total) by mouth 2 (two) times daily. 05/11/19 05/10/20 Yes Clegg, Amy D, NP  colestipol (COLESTID) 1 g tablet Take 2 tablets (2 g total) by mouth 2 (two) times daily. Patient taking differently: Take 2 g by mouth daily as needed (IBS).  02/10/19  Yes Hoyt Koch, MD  diclofenac sodium (VOLTAREN) 1 % GEL Apply 4 g topically 4 (four) times daily. Patient taking differently: Apply 4 g topically 4 (four) times  daily as needed (pain).  06/23/18  Yes Hoyt Koch, MD  dicyclomine (BENTYL) 20 MG tablet Take 20 mg by mouth daily as needed for spasms.    Yes [provider]  fluticasone (FLONASE) 50 MCG/ACT nasal spray Place 2 sprays into both nostrils daily as needed for allergies. 07/25/18  Yes Hoyt Koch, MD  gabapentin (NEURONTIN) 100 MG capsule Take 100 mg by mouth 2 (two) times daily as needed (back spasms during the day).    Yes [provider]  gabapentin (NEURONTIN) 300 MG capsule Take 300 mg by mouth at bedtime as needed (back spasms).   Yes [provider]  Ibuprofen-Famotidine 800-26.6 MG TABS Take 1 tablet by mouth 3 (three) times daily as needed. Patient taking differently: Take 1 tablet by mouth 3 (three) times daily as needed (muscle pain).  01/17/18  Yes Hulan Saas M, DO  ivabradine (CORLANOR) 7.5 MG TABS tablet TAKE 1 TABLET (7.5 MG TOTAL) BY MOUTH 2 (TWO) TIMES DAILY WITH A MEAL. Patient taking differently: Take 7.5 mg by mouth daily with lunch.  02/15/19  Yes Bensimhon, Shaune Pascal, MD  lidocaine (LIDODERM) 5 % Place 1 patch onto the skin daily. Remove & Discard patch within 12 hours or as directed by MD Patient taking differently: Place 1 patch onto the skin 2 (two) times daily as needed (pain). Remove & Discard patch within 12 hours or as directed by MD 01/17/18  Yes Hoyt Koch, MD  montelukast (SINGULAIR) 10 MG tablet Take 1 tablet (10 mg total) by mouth at bedtime. 09/13/19  Yes Fenton Foy, NP  Multiple Vitamins-Minerals (MULTIVITAMIN GUMMIES WOMENS PO) Take 2 tablets by mouth at bedtime.    Yes [provider]  nitroGLYCERIN (NITROSTAT) 0.4 MG SL tablet Place 1 tablet (0.4 mg total) under the tongue every 5 (five) minutes as needed for chest pain. 09/05/19  Yes Bensimhon, Shaune Pascal, MD  omeprazole (PRILOSEC) 20 MG capsule Take 20 mg by mouth daily as needed (for reflux symptoms).  09/09/17 11/22/19 Yes [provider]   potassium chloride (KLOR-CON) 10 MEQ tablet Take 20 mEq by mouth 2 (two) times daily. 10/31/19  Yes [provider]  sacubitril-valsartan (ENTRESTO) 97-103 MG Take 1 tablet by mouth 2 (two) times daily. 09/05/19  Yes Bensimhon, Shaune Pascal, MD  spironolactone (ALDACTONE) 50 MG tablet Take 1 tablet (50 mg total) by mouth daily. 05/10/19  Yes Bensimhon, Shaune Pascal, MD  SYMBICORT 160-4.5 MCG/ACT inhaler INHALE 2 PUFFS INTO THE LUNGS 2 (TWO) TIMES DAILY AS  NEEDED. Patient taking differently: Inhale 2 puffs into the lungs 2 (two) times daily as needed (shortness of breath/wheezing).  05/03/18  Yes Lyndal Pulley, DO  tiZANidine (ZANAFLEX) 4 MG tablet Take 4 mg by mouth every 8 (eight) hours as needed for muscle spasms.  12/02/18  Yes [provider]  torsemide (DEMADEX) 20 MG tablet Take 2 tablets (40 mg total) by mouth 2 (two) times daily. Patient taking differently: Take 20-40 mg by mouth See admin instructions. Take 2 tablets (40 mg) by mouth daily with lunch, may also take an additional tablet (20 mg) as needed for fluid/swelling 10/06/19  Yes Clegg, Amy D, NP  Ubrogepant (UBRELVY) 50 MG TABS Take 50 mg by mouth every 2 (two) hours as needed (migraine). Patient taking differently: Take 50 mg by mouth See admin instructions. Take one tablet (50 mg) by mouth at onset of migraine headache, may repeat in 30 minutes if still needed. 10/17/19  Yes Hoyt Koch, MD  Vitamin D, Ergocalciferol, (DRISDOL) 1.25 MG (50000 UT) CAPS capsule Take 1 capsule (50,000 Units total) by mouth every 7 (seven) days. Patient taking differently: Take 50,000 Units by mouth every Wednesday.  08/01/18  Yes Lyndal Pulley, DO  dapagliflozin propanediol (FARXIGA) 10 MG TABS tablet Take 10 mg by mouth daily before breakfast. Patient not taking: Reported on 08/23/2019 05/11/19   Conrad Towns, NP  linaclotide Rolan Lipa) 145 MCG CAPS capsule Take 1 capsule (145 mcg total) by mouth daily before breakfast. Patient not  taking: Reported on 08/23/2019 10/19/17   Jerene Bears, MD    Physical Exam: Vitals:   11/12/19 1539 11/12/19 1544 11/12/19 1549 11/12/19 1554  BP:      Pulse: (!) 101 (!) 102 100 (!) 105  Resp: (!) 32 (!) 23 (!) 41 (!) 21  Temp:      TempSrc:      SpO2: 98% 98% 100% 100%  Weight:      Height:        Constitutional: NAD, calm, comfortable Vitals:   11/12/19 1539 11/12/19 1544 11/12/19 1549 11/12/19 1554  BP:      Pulse: (!) 101 (!) 102 100 (!) 105  Resp: (!) 32 (!) 23 (!) 41 (!) 21  Temp:      TempSrc:      SpO2: 98% 98% 100% 100%  Weight:      Height:       Eyes: PERRL, lids and conjunctivae normal ENMT: Mucous membranes are moist. Posterior pharynx clear of any exudate or lesions.Normal dentition.  Neck: normal, supple, no masses, no thyromegaly Respiratory: clear to auscultation bilaterally, no wheezing, no crackles. Normal respiratory effort. No accessory muscle use.  Cardiovascular: Regular rate and rhythm with frequent PVCs, no murmurs / rubs / gallops. No extremity edema. 2+ pedal pulses. No carotid bruits.  Abdomen: no tenderness, no masses palpated. No hepatosplenomegaly. Bowel sounds positive.  Musculoskeletal: no clubbing / cyanosis. No joint deformity upper and lower extremities. Good ROM, no contractures. Normal muscle tone.  Skin: no rashes, lesions, ulcers. No induration Neurologic: CN 2-12 grossly intact. Sensation intact, DTR normal. Strength 5/5 in all 4.  Psychiatric: Normal judgment and insight. Alert and oriented x 3. Normal mood.     Labs on Admission: I have personally reviewed following labs and imaging studies  CBC: Recent Labs  Lab 11/12/19 1249  WBC 7.6  HGB 9.6*  HCT 32.1*  MCV 79.3*  PLT 973   Basic Metabolic Panel: Recent Labs  Lab 11/12/19  1249  NA 138  K 3.5  CL 107  CO2 23  GLUCOSE 120*  BUN 8  CREATININE 0.77  CALCIUM 9.0   GFR: Estimated Creatinine Clearance: 106.1 mL/min (by C-G formula based on SCr of 0.77  mg/dL). Liver Function Tests: No results for input(s): AST, ALT, ALKPHOS, BILITOT, PROT, ALBUMIN in the last 168 hours. No results for input(s): LIPASE, AMYLASE in the last 168 hours. No results for input(s): AMMONIA in the last 168 hours. Coagulation Profile: No results for input(s): INR, PROTIME in the last 168 hours. Cardiac Enzymes: No results for input(s): CKTOTAL, CKMB, CKMBINDEX, TROPONINI in the last 168 hours. BNP (last 3 results) No results for input(s): PROBNP in the last 8760 hours. HbA1C: No results for input(s): HGBA1C in the last 72 hours. CBG: No results for input(s): GLUCAP in the last 168 hours. Lipid Profile: No results for input(s): CHOL, HDL, LDLCALC, TRIG, CHOLHDL, LDLDIRECT in the last 72 hours. Thyroid Function Tests: No results for input(s): TSH, T4TOTAL, FREET4, T3FREE, THYROIDAB in the last 72 hours. Anemia Panel: No results for input(s): VITAMINB12, FOLATE, FERRITIN, TIBC, IRON, RETICCTPCT in the last 72 hours. Urine analysis:    Component Value Date/Time   COLORURINE YELLOW 05/28/2016 1202   APPEARANCEUR Cloudy (A) 05/28/2016 1202   LABSPEC >=1.030 (A) 05/28/2016 1202   PHURINE 6.0 05/28/2016 1202   GLUCOSEU NEGATIVE 05/28/2016 1202   HGBUR TRACE-INTACT (A) 05/28/2016 1202   BILIRUBINUR Neg 02/10/2019 1608   KETONESUR NEGATIVE 05/28/2016 1202   PROTEINUR Positive (A) 02/10/2019 1608   PROTEINUR NEGATIVE 03/01/2016 1020   UROBILINOGEN 0.2 02/10/2019 1608   UROBILINOGEN 0.2 05/28/2016 1202   NITRITE Neg 02/10/2019 1608   NITRITE NEGATIVE 05/28/2016 1202   LEUKOCYTESUR Negative 02/10/2019 1608    Radiological Exams on Admission: DG Chest 2 View  Result Date: 11/12/2019 CLINICAL DATA:  Chest pain and cough. EXAM: CHEST - 2 VIEW COMPARISON:  September 07, 2019 FINDINGS: Stable AICD device. The heart, hila, and mediastinum are normal. No pneumothorax. No nodules or masses. No focal infiltrates. IMPRESSION: No active cardiopulmonary disease.  Electronically Signed   By: Dorise Bullion III M.D   On: 11/12/2019 13:31    EKG: Independently reviewed.  Frequent PVCs and short run of V. tach  Assessment/Plan Active Problems:   V-tach [facility])  (please populate well all problems here in Problem List. (For example, if patient is on BP meds at home and you resume or decide to hold them, it is a problem that needs to be her. Same for CAD, COPD, HLD and so on)  Atypical chest pain -Pleural like chest pain, with history of recent URI, suspect pericarditis.  Cardiology already started patient on colchicine and will check echocardiogram.  Also send ESR CRP and coxsackie virus a and B type study. -ACS low suspicion.  Nonsustained V. Tach -Check electrolytes -Her blood pressure still allow titrate up beta-blocker which was done by cardiologist -Continue amiodarone -Monitor overnight in stepdown unit.  Patient reports that her AICD has never discharged before.  Nonischemic cardiomyopathy/postpartum cardiomyopathy -She is euvolemic, will continue same home regimen of Entresto and diuresis regimen  Asthma -No symptoms or signs of acute exacerbation  Migraine -Avoid NSAIDs  IBS -Continue home as needed regimen  Peripheral neuropathy -Continue gabapentin  DVT prophylaxis: Lovenox Code Status: Full Family Communication: None at bedside Disposition Plan: Probably can be discharged home in next 24 to 48 hours once symptoms were lysed and PVC got more controlled. Consults called: Cardiology Admission status: PCU  and then telemetry   Lequita Halt MD [facility] Pager [phone]   11/12/2019, 6:23 PM

## 2019-11-12 NOTE — Consult Note (Signed)
Cardiology Consultation:   Patient ID: [name] MRN: [id number]; DOB: 09-Aug-1975  Admit date: 11/12/2019 Date of Consult: 11/12/2019  Primary Care Provider: Hoyt Koch, MD [facility] Cardiologist: Dr [facility] Electrophysiologist: Dr Lovena Le  Patient Profile:   [name] is a 44 y.o. female with a hx of chronic combined CHF who is being seen today for the evaluation of cough and chest pain at the request of Dr. Vivi Martens.  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   Was admitted 12/5-11/2016 with recurrent HF, URI, CP and elevated BP (160/110). CT chest no PE. Diuresed from 233-> 228 pounds.   Admitted 11/2017 with CP described as "pounding". Troponins were negative. Symptoms thought to be related to frequent PVCs. EP consulted and scheduled her for PVC ablation. Echo repeated 11/22/17 and showed improved EF 50-55%. HF team consulted and changed torsemide to 40 mg daily (from 3x/week).   Underwent PVC ablation 12/01/17. Was felt not to be successful.  Was on flecainide but discontinued due to reduced EF. Now on amiodarone.   Repeat Echo 10/2018 showed drop in EF back down to 25-30%. Was instructed to f/u in clinic but pt did not. She had outpatient monitor 04/2018 that showed rare PVCs.    In ED on 04/12/19 w/ CC of chest pain. According to EDP documentation, pain had been present for 3-6 hrs prior to presenting to the ED. Described as central-left sided heaviness/pressure/tightness that was worse with exertion. No pleuritic pain or hypoxia to suggest acute PE. Hs Troponin normal x 2 (4>>5). EKG showed sinus tach 104 w/  frequent PVCs. Device interrogation was unremarkable. She was released from the ED and instructed to f/u in clinic.   Saw EP on 12/11 with suggestion for bb again.   Last seen in CHF clinic on 05/11/2019, doing ok at the time except for more palpitations. more palpitations, Zio Patch 04/2019 > 17% PVCs and >8% couplets, amio 200 mg daily was continued. Mild SOB with steps.  No PND/Orthopnea.  Weight at home 200-206 pounds.   [employer] Score 35: Worsening S3, thoracic Impedance, night heart rate, and respiratory rate.   11/12/2019 - she presented to the ER with cough, dyspnea since Tuesday, midsternal chest discomfort that started last night.  The patient states that her chest pain feels like retrosternal squeezing, it is worse when she lays down and when she lays on her left side and better when she sits up.  Not affected by deep inspiration.  Her cough is productive only of clear sputum.  She denies any fever or chills, no sick contacts recently.  She has not received Covid vaccine yet. No orthopnea or proximal nocturnal dyspnea.  No lower extremity edema.  She has been compliant with her medications.  No exertional symptoms.  Significantly worsening palpitations since visit in December.  No ICD shocks.  No syncope.   Cardiac Studies  Echo done 05/19/2018 by Dr Haroldine Laws --. EF back down to 20-25% - Bedside echo by Dr. Haroldine Laws with EF improved to 50%. Echo 6/20 - EF back down at 25-30%.   Cardiac Testing.  Cath 6/17 revealed normal vessels, moderate elevation  in pulmonary artery pressures, and LVEF 25-30%.  [place] 3/19: RA = 2 RV = 26/7 PA = 28/13 (19) PCW = 5 Fick cardiac output/index = 5.7/2.8 PVR = 2.5 WU Ao sat = 99% PA sat = 71%, 72% High SVC sat = 65% RHC 6/17 RA 5 RV 35/6 PA 41/12 PW 11 Fick 6.4/3.0 PA sat 62%  Echo 6/15 35-40% Echo 6/17 25-30% Echo 12/17 20-25% Echo 8/18 EF 25% Echo 7/19: EF 50-55% Echo 6/20: EF 25-30%  CPX 2/18 FVC 2.57 (79%)      FEV1 2.14 (81%)     FEV1/FVC 83 (101%)     MVV 107 (102%) Resting HR: 106 Peak HR: 166  (93% age predicted max HR) BP rest: 136/98 BP peak: 174/88 Peak VO2: 17.1 (85% predicted peak VO2) - corrects to 28.4 for ibw VE/VCO2 slope: 33 OUES: 2.16 Peak RER: 1.09 Ventilatory Threshold: 14.6 (73% predicted or measured peak VO2) VE/MVV: 59% PETCO2 at peak: 33 O2pulse: 10  (83% predicted O2pulse)  CPX 12/18 pVO2 14.0 (corrects to 23.0 for IBW) VeVCO2  32 RER 1.0   Past Medical History:  Diagnosis Date  . Acute on chronic systolic (congestive) heart failure ([place]) 04/29/2017  . Acute pain of right shoulder 06/16/2017  . AICD (automatic cardioverter/defibrillator) present   . Anxiety   . Arthritis    right shoulder   . Asthma   . CHF (congestive heart failure) ([place])   . Chronic combined systolic and diastolic heart failure ([place]) 03/05/2014  . Closed low lateral malleolus fracture 10/23/2013  . Cystitis 10/21/2017  . Depression   . Depression with anxiety 01/20/2013  . Diabetes mellitus without complication ([place])   . Diverticulosis   . Dyspnea    comes and goes intermittently mostly with exertion   . Essential hypertension    Prev followed by [facility]   . Fibroid    age 17  . Gallstones   . Generalized abdominal cramping   . History of cardiomyopathy   . Hypertension   . IBS (irritable bowel syndrome)   . 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 Insomnia 05/07/2017  . Labral tear of shoulder 04/04/2015    Injected 04/04/2015 Injected 12/03/2015   . Migraine    "monthly" (08/03/2016)  . Myofascial pain 06/16/2017  . NICM (nonischemic cardiomyopathy) ([place]) 08/03/2016  . Nonallopathic lesion of lumbosacral region 11/16/2016  . Nonallopathic lesion of sacral region 11/16/2016  . Nonallopathic lesion of thoracic region 08/20/2014  . Nonspecific chest pain 04/28/2017  . OSA (obstructive sleep apnea) 01/02/2013   NPSG 2009:  AHI  9/hr. CPAP intolerance >> "smothering" Good tolerance of auto device (optimal pressure 12-13 on download).  - referred to Dr Gwenette Greet    . OSA on CPAP   . Ovarian cyst    1999; surgically removed  . Patellofemoral syndrome of both knees 10/16/2016  . 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 PVC (premature ventricular contraction) 06/23/2016  . Seizures ([place])    "as a child" (08/03/2016)  . Termination of pregnancy    due to cardiac risk    Past Surgical History:  Procedure Laterality Date  . CARDIAC CATHETERIZATION N/A 11/11/2015   Procedure: Right/Left Heart Cath and Coronary Angiography;  Surgeon: Troy Sine, MD;  Location: [place] [place] [facility];  Service: Cardiovascular;  Laterality: N/A;  . CARDIAC CATHETERIZATION  ~ 2015  . CARDIAC DEFIBRILLATOR PLACEMENT  08/03/2016  . CESAREAN SECTION  1999  . COLONOSCOPY WITH  PROPOFOL N/A 04/21/2016   Procedure: COLONOSCOPY WITH PROPOFOL;  Surgeon: Jerene Bears, MD;  Location: [facility];  Service: Gastroenterology;  Laterality: N/A;  . ESOPHAGOGASTRODUODENOSCOPY (EGD) WITH PROPOFOL N/A 04/21/2016   Procedure: ESOPHAGOGASTRODUODENOSCOPY (EGD) WITH PROPOFOL;  Surgeon: Jerene Bears, MD;  Location: [facility];  Service: Gastroenterology;  Laterality: N/A;  . FOOT FRACTURE SURGERY Right ~ 2003  . FRACTURE SURGERY    . ICD IMPLANT N/A 08/03/2016   Procedure: ICD Implant;  Surgeon: Deboraha Sprang, MD;  Location: [facility];  Service: Cardiovascular;  Laterality: N/A;  . LAPAROSCOPIC CHOLECYSTECTOMY  12/2006  . LAPAROSCOPIC GASTRIC SLEEVE RESECTION    . LAPAROSCOPY ABDOMEN DIAGNOSTIC  2008   "cut bile duct w/gallbladder OR; had to go in later & fix leak; hospitalized for 2 months"  . LEFT HEART CATHETERIZATION WITH CORONARY ANGIOGRAM N/A 02/26/2014   Procedure: LEFT HEART CATHETERIZATION WITH CORONARY ANGIOGRAM;  Surgeon: Jettie Booze, MD;  Location: [name];  Service: Cardiovascular;  Laterality: N/A;  .  OVARIAN CYST REMOVAL Right 1999  . PVC ABLATION N/A 12/01/2017   Procedure: PVC ABLATION;  Surgeon: Evans Lance, MD;  Location: [facility];  Service: Cardiovascular;  Laterality: N/A;  . RIGHT HEART CATH N/A 08/05/2017   Procedure: RIGHT HEART CATH;  Surgeon: Jolaine Artist, MD;  Location: [place] [facility];  Service: Cardiovascular;  Laterality: N/A;  . TUBAL LIGATION  1999    Inpatient Medications: Scheduled Meds: . sodium chloride flush  3 mL Intravenous Once   Allergies:    Allergies  Allergen Reactions  . Aspirin Anaphylaxis and Other (See Comments)    Wheezing,  . Vancomycin Other (See Comments)    "did something to my kidneys," PROGRESSED TO KIDNEY FAILURE!!  . Contrast Media [Iodinated Diagnostic Agents] Other (See Comments)    Multiple CT contrast studies done over 2 weeks caused ARF  . Avocado Itching and Swelling  . Ciprofloxacin Itching and Rash  . Sulfa Antibiotics Itching and Rash    Social History:   Social History   Socioeconomic History  . Marital status: Married    Spouse name: Not on file  . Number of children: 1  . Years of education: Not on file  . Highest education level: Not on file  Occupational History  . Occupation: stay at home mom  Tobacco Use  . Smoking status: Never Smoker  . Smokeless tobacco: Never Used  Vaping Use  . Vaping Use: Never used  Substance and Sexual Activity  . Alcohol use: No  . Drug use: No  . Sexual activity: Yes  Other Topics Concern  . Not on file  Social History Narrative  . Not on file   Social Determinants of Health   Financial Resource Strain:   . Difficulty of Paying Living Expenses:   Food Insecurity: No Food Insecurity  . Worried About Charity fundraiser in the Last Year: Never true  . Ran Out of Food in the Last Year: Never true  Transportation Needs: No Transportation Needs  . Lack of Transportation (Medical): No  . Lack of Transportation (Non-Medical): No  Physical Activity:   .  Days of Exercise per Week:   . Minutes of Exercise per Session:   Stress:   . Feeling of Stress :   Social Connections:   . Frequency of Communication with Friends and Family:   . Frequency of Social Gatherings with Friends and Family:   . Attends Religious Services:   .  Active Member of Clubs or Organizations:   . Attends Archivist Meetings:   Marland Kitchen Marital Status:   Intimate Partner Violence:   . Fear of Current or Ex-Partner:   . Emotionally Abused:   Marland Kitchen Physically Abused:   . Sexually Abused:     Family History:    Family History  Problem Relation Age of Onset  . Emphysema Maternal Grandmother        smoked  . Heart disease Maternal Grandmother 41       MI  . Rheum arthritis Mother   . Allergies Daughter   . Colon cancer Neg Hx      ROS:  Please see the history of present illness.  All other ROS reviewed and negative.     Physical Exam/Data:   Vitals:   11/12/19 1539 11/12/19 1544 11/12/19 1549 11/12/19 1554  BP:      Pulse: (!) 101 (!) 102 100 (!) 105  Resp: (!) 32 (!) 23 (!) 41 (!) 21  Temp:      TempSrc:      SpO2: 98% 98% 100% 100%  Weight:      Height:       No intake or output data in the 24 hours ending 11/12/19 1633 Last 3 Weights 11/12/2019 09/13/2019 09/05/2019  Weight (lbs) 221 lb 219 lb 217 lb 4 oz  Weight (kg) 100.245 kg 99.338 kg 98.544 kg     Body mass index is 35.67 kg/m.  General:  Well nourished, well developed, in no acute distress, coughing during exam HEENT: normal Lymph: no adenopathy Neck: no JVD Endocrine:  No thryomegaly Vascular: No carotid bruits; FA pulses 2+ bilaterally without bruits  Cardiac:  normal S1, S2; RRR; no murmur, mild rub on inspiration Lungs:  clear to auscultation bilaterally, no wheezing, rhonchi or rales  Abd: soft, nontender, no hepatomegaly  Ext: no edema Musculoskeletal:  No deformities, BUE and BLE strength normal and equal Skin: warm and dry, good peripheral pulses Neuro:  CNs 2-12 intact, no  focal abnormalities noted Psych:  Normal affect   EKG:  The EKG was personally reviewed and demonstrates:  SR, PVC, ventricular couplets, unchanged from prior Telemetry:  Telemetry was personally reviewed and demonstrates: Sinus rhythm with frequent PVCs and nonsustained VTs up to 6 beats.  Relevant CV Studies:  TTE: 11/01/2018  1. The left ventricle has severely reduced systolic function, with an  ejection fraction of 25-30%. The cavity size was mildly dilated. Left  ventricular diastolic Doppler parameters are consistent with  pseudonormalization. Elevated mean left atrial  pressure Left ventrical global hypokinesis without regional wall motion  abnormalities.  2. The right ventricle has normal systolic function. The cavity was  normal. There is no increase in right ventricular wall thickness. Right  ventricular systolic pressure is mildly elevated with an estimated  pressure of 37.8 mmHg.  3. Left atrial size was mild-moderately dilated.  4. The aortic root and ascending aorta are normal in size and structure.  5. The inferior vena cava was dilated in size with <50% respiratory  variability.  6. When compared to the prior study: compared to 11/22/2017, there is  marked worsening of LV systolic function. Incessant PVCs were noted during  the study and make interpretation of diastolic parameters less reliable.   Laboratory Data:  High Sensitivity Troponin:   Recent Labs  Lab 11/12/19 1249  TROPONINIHS 4     Chemistry Recent Labs  Lab 11/12/19 1249  NA 138  K 3.5  CL 107  CO2 23  GLUCOSE 120*  BUN 8  CREATININE 0.77  CALCIUM 9.0  GFRNONAA >60  GFRAA >60  ANIONGAP 8    No results for input(s): PROT, ALBUMIN, AST, ALT, ALKPHOS, BILITOT in the last 168 hours. Hematology Recent Labs  Lab 11/12/19 1249  WBC 7.6  RBC 4.05  HGB 9.6*  HCT 32.1*  MCV 79.3*  MCH 23.7*  MCHC 29.9*  RDW 15.5  PLT 212   BNPNo results for input(s): BNP, PROBNP in the last 168  hours.  DDimer No results for input(s): DDIMER in the last 168 hours.   Radiology/Studies:  DG Chest 2 View  Result Date: 11/12/2019 CLINICAL DATA:  Chest pain and cough. EXAM: CHEST - 2 VIEW COMPARISON:  September 07, 2019 FINDINGS: Stable AICD device. The heart, hila, and mediastinum are normal. No pneumothorax. No nodules or masses. No focal infiltrates. IMPRESSION: No active cardiopulmonary disease. Electronically Signed   By: Dorise Bullion III M.D   On: 11/12/2019 13:31    Assessment and Plan:   1. Pleuritic chest pain  -Typical positional worse when laying down and laying on the left side and on deep inspiration.  No fever or chills. -I will start colchicine 0.6 mg p.o. twice daily  2. Chronic systolic HF:  due to peri-partum CM, onset 1999. S/P [employer] ICD 07/2016 - EF 20-25% (12/17).  - Echo 8/18 EF 25-30% s/p ICD 3/18. [place] 3/19: RA 2, PA 28/13, Fick CO 5.7, CI 2.8 - CPX 12/18 showed moderate limitation due mostly to obesity with some HF component - Echo 11/22/17 LVEF 50-55%, Grade 1 DD, Mild MR, Mod LAE - Echo 04/2018 by Dr Haroldine Laws EF20-25%. RV ok - Bedside echo by Dr. Haroldine Laws with EF improved to 50%. - Echo 10/2018 showed drop in EF back down to 25-30%.  Given drop in EF again and frequent PVCs, Zio Patch was ordered. This showed > 17% PVC burden.  -  NYHA II-III Continue torsemide 40 mg daily.   - Continue Entresto 97-103 mg BID - Continue Spiro 50 mg daily.  - Continue corlanor 7.5mg  BID for now.   -Increase carvedilol to 12.5 mg p.o. twice daily -She appears euvolemic however up 15 pounds, she states that she has gradually gained weight since December. -I will give Lasix 40 mg IV twice daily for 2 doses -Check BNP -Repeat echocardiogram she has not had one in 1 year.  3.  Frequent ventricular ectopy and runs of NSVT -Worsening palpitations, also a lot of ectopy and NSVT's on telemetry - Holter Monitor 11/09/17 with 30% PVCs with one primary morphology.  - S/p  PVC ablation 11/2017 with Dr Lovena Le unsuccessful - Off flecainide with EF down and persistent PVCs. Changed to amio - Zio Patch 04/2019 > 17% PVCs and >8% couplets.  -I will increase carvedilol 12.5 mg p.o. twice daily -Continue amiodarone 200 mg daily -We will check TSH and all the electrolytes including magnesium.  4. Morbid obesity - s/p Gastric Sleeve at [facility] 09/20/17  - Body mass index is 35.48 kg/m.  -Discussed portion control.   5. DM2 - A1c in Jan. 2018 is 5.3 with medications.  - Added farxiga in Dec 2020    For questions or updates, please contact [facility] Please consult [email or web address] for contact info under    Signed, Ena Dawley, MD  11/12/2019 4:33 PM

## 2019-11-12 NOTE — ED Triage Notes (Addendum)
Pt presents with cough and mid-sternum CP x1 week, worsening over night. Increase pain with deep breathing.

## 2019-11-13 ENCOUNTER — Observation Stay (HOSPITAL_COMMUNITY): Payer: HMO

## 2019-11-13 DIAGNOSIS — J45909 Unspecified asthma, uncomplicated: Secondary | ICD-10-CM

## 2019-11-13 DIAGNOSIS — I472 Ventricular tachycardia: Secondary | ICD-10-CM | POA: Diagnosis not present

## 2019-11-13 DIAGNOSIS — R079 Chest pain, unspecified: Secondary | ICD-10-CM | POA: Diagnosis not present

## 2019-11-13 DIAGNOSIS — I10 Essential (primary) hypertension: Secondary | ICD-10-CM

## 2019-11-13 DIAGNOSIS — G43909 Migraine, unspecified, not intractable, without status migrainosus: Secondary | ICD-10-CM

## 2019-11-13 DIAGNOSIS — I5031 Acute diastolic (congestive) heart failure: Secondary | ICD-10-CM | POA: Diagnosis not present

## 2019-11-13 DIAGNOSIS — I5043 Acute on chronic combined systolic (congestive) and diastolic (congestive) heart failure: Secondary | ICD-10-CM | POA: Diagnosis not present

## 2019-11-13 DIAGNOSIS — I428 Other cardiomyopathies: Secondary | ICD-10-CM | POA: Diagnosis not present

## 2019-11-13 DIAGNOSIS — K589 Irritable bowel syndrome without diarrhea: Secondary | ICD-10-CM

## 2019-11-13 LAB — BASIC METABOLIC PANEL
Anion gap: 10 (ref 5–15)
BUN: 6 mg/dL (ref 6–20)
CO2: 26 mmol/L (ref 22–32)
Calcium: 9.2 mg/dL (ref 8.9–10.3)
Chloride: 98 mmol/L (ref 98–111)
Creatinine, Ser: 0.81 mg/dL (ref 0.44–1.00)
GFR calc Af Amer: >60 mL/min (ref >60)
GFR calc non Af Amer: >60 mL/min (ref >60)
Glucose, Bld: 128 mg/dL — ABNORMAL HIGH (ref 70–99)
Potassium: 3.2 mmol/L — ABNORMAL LOW (ref 3.5–5.1)
Sodium: 134 mmol/L — ABNORMAL LOW (ref 135–145)

## 2019-11-13 LAB — ECHOCARDIOGRAM COMPLETE
Height: 66 in
Weight: 3404.8 oz

## 2019-11-13 LAB — MAGNESIUM
Magnesium: 1.7 mg/dL (ref 1.7–2.4)
Magnesium: 1.7 mg/dL (ref 1.7–2.4)

## 2019-11-13 LAB — C-REACTIVE PROTEIN: CRP: 0.9 mg/dL (ref <1.0)

## 2019-11-13 LAB — HIV ANTIBODY (ROUTINE TESTING W REFLEX): HIV Screen 4th Generation wRfx: NONREACTIVE

## 2019-11-13 LAB — PHOSPHORUS: Phosphorus: 3.5 mg/dL (ref 2.5–4.6)

## 2019-11-13 MED ORDER — POTASSIUM CHLORIDE CRYS ER 20 MEQ PO TBCR
40.0000 meq | EXTENDED_RELEASE_TABLET | Freq: Four times a day (QID) | ORAL | Status: AC
  Administered 2019-11-13 (×2): 40 meq via ORAL
  Filled 2019-11-13 (×2): qty 2

## 2019-11-13 MED ORDER — IVABRADINE HCL 7.5 MG PO TABS
7.5000 mg | ORAL_TABLET | Freq: Two times a day (BID) | ORAL | Status: DC
  Administered 2019-11-14 – 2019-11-17 (×6): 7.5 mg via ORAL
  Filled 2019-11-13 (×8): qty 1

## 2019-11-13 MED ORDER — MAGNESIUM SULFATE 2 GM/50ML IV SOLN
2.0000 g | Freq: Once | INTRAVENOUS | Status: AC
  Administered 2019-11-13: 2 g via INTRAVENOUS
  Filled 2019-11-13: qty 50

## 2019-11-13 MED ORDER — ONDANSETRON HCL 4 MG/2ML IJ SOLN
4.0000 mg | Freq: Four times a day (QID) | INTRAMUSCULAR | Status: DC | PRN
  Administered 2019-11-13 – 2019-11-16 (×7): 4 mg via INTRAVENOUS
  Filled 2019-11-13 (×7): qty 2

## 2019-11-13 MED ORDER — SACUBITRIL-VALSARTAN 49-51 MG PO TABS
1.0000 | ORAL_TABLET | Freq: Two times a day (BID) | ORAL | Status: DC
  Administered 2019-11-13 (×2): 1 via ORAL
  Filled 2019-11-13 (×3): qty 1

## 2019-11-13 NOTE — Progress Notes (Signed)
@   1225- Pt.'s SBP is on high 80's w/ MAP not less than 60,asymptomatic w/o any complains; Dr. Ninfa Meeker & Kennon Rounds aware w/o further orders made. -Will continue monitor pt

## 2019-11-13 NOTE — Progress Notes (Signed)
Patient ID: [name], female   DOB: 1975/11/20, 44 y.o.   MRN: [id number]  PROGRESS NOTE    [name]  HKV:[id number] DOB: [birth year] DOA: 11/12/2019 PCP: Hoyt Koch, MD    Brief Narrative:  44 y.o. female with medical history significant of postpartum cardiomyopathy with systolic CHF with normal cath x2 in the past, status post AICD, frequent PVCs status post unsuccessful ablation, asthma, IBS, tented with new onset chest pain.  Symptoms started last night before he went to bed, sharp like, persistent chest pain, localized, intensity 8/10, worsened with lying down and deep breath, improved with sitting up which made her got up early this morning around 4 AM, associated with short of breath but no sweating.  She also has had more palpitations than usual.  She has had frequent PVCs for years had unsuccessful ablation couple years ago, and has been on beta-blocker and amiodarone since.  She occasionally feels short of breath associated with exertion, and she takes torsemide twice daily, meantime her AICD monitor intraventricular dynamic and remotely alarmed cardiology for additional as needed torsemide, which she has been doing very well volume status wise.  About 2-3 weeks ago patient was treated by PCP for URI symptoms of congestion and productive cough. Symptoms resolved.  Monitoring and EKG shows frequent PVCs occasional degenerate into short run V-Tach.  Troponin hip.  X-ray negative for any acute infiltrates.   Assessment & Plan:   Active Problems:   V-tach [facility])  Atypical chest pain -Pleural like chest pain, with history of recent URI, suspect pericarditis.  Cardiology already started patient on colchicine--d/c'd and will check echocardiogram.  Also send ESR CRP (negative) and coxsackie virus a and B type study. -ACS low suspicion.  Nonsustained V. Tach -Check electrolytes -Her blood pressure still allow titrate up beta-blocker which was done by cardiologist -Continue  amiodarone -Monitor overnight in stepdown unit.  Patient reports that her AICD has never discharged before.  Nonischemic cardiomyopathy/postpartum cardiomyopathy -She is euvolemic, will continue same home regimen of Entresto and diuresis regimen Cavedilol - increased dose to 12.5 bid Ivabradine 7.5 mg bid Entresto - decreased dose Spirinolactone 50 mg q d Torsemide 40 mg bid Echo 6/15 35-40% Echo 6/17 25-30% Echo 12/17 20-25% Echo 8/18 EF 25% Echo 7/19: EF 50-55% Echo 6/20: EF 25-30%  Asthma -No symptoms or signs of acute exacerbation  Migraine -Avoid NSAIDs  IBS -Continue home as needed regimen  Peripheral neuropathy -Continue gabapentin   DVT prophylaxis: Lovenox SQ Code Status: Full code  Family Communication: Patient at bedside Disposition Plan: Home   Consultants:   Cardiology  Procedures:  Echo pending  Antimicrobials: Anti-infectives (From admission, onward)   None       Subjective: Still with nausea, and mild chest pain.  Objective: Vitals:   11/13/19 0443 11/13/19 0815 11/13/19 0931 11/13/19 1243  BP: 106/70  107/77   Pulse: 86 92 88   Resp: 20 18    Temp: 98.6 F (37 C)   98.4 F (36.9 C)  TempSrc: Oral   Oral  SpO2: 100% 100%    Weight:      Height:        Intake/Output Summary (Last 24 hours) at 11/13/2019 1326 Last data filed at 11/13/2019 0900 Gross per 24 hour  Intake 442 ml  Output 2400 ml  Net -1958 ml   Filed Weights   11/12/19 1242 11/12/19 2302 11/13/19 0441  Weight: 100.2 kg 98.6 kg 96.5 kg    Examination:  General exam: Appears calm and comfortable  Respiratory system: Clear to auscultation. Respiratory effort normal. Cardiovascular system: S1 & S2 heard, RRR.  Gastrointestinal system: Abdomen is nondistended, soft and nontender.  Central nervous system: Alert and oriented. No focal neurological deficits. Extremities: Symmetric  Skin: No rashes Psychiatry: Judgement and insight appear normal. Mood &  affect appropriate.     Data Reviewed: I have personally reviewed following labs and imaging studies  CBC: Recent Labs  Lab 11/12/19 1249  WBC 7.6  HGB 9.6*  HCT 32.1*  MCV 79.3*  PLT 268   Basic Metabolic Panel: Recent Labs  Lab 11/12/19 1249 11/13/19 0131  NA 138 134*  K 3.5 3.2*  CL 107 98  CO2 23 26  GLUCOSE 120* 128*  BUN 8 6  CREATININE 0.77 0.81  CALCIUM 9.0 9.2  MG  --  1.7  1.7  PHOS  --  3.5   GFR: Estimated Creatinine Clearance: 102.7 mL/min (by C-G formula based on SCr of 0.81 mg/dL). Thyroid Function Tests: Recent Labs    11/12/19 1812  TSH 1.722     Recent Results (from the past 240 hour(s))  SARS Coronavirus 2 by RT PCR (hospital order, performed in [facility] hospital lab) Nasopharyngeal Nasopharyngeal Swab     Status: None   Collection Time: 11/12/19  6:12 PM   Specimen: Nasopharyngeal Swab  Result Value Ref Range Status   SARS Coronavirus 2 NEGATIVE NEGATIVE Final    Comment: (NOTE) SARS-CoV-2 target nucleic acids are NOT DETECTED.  The SARS-CoV-2 RNA is generally detectable in upper and lower respiratory specimens during the acute phase of infection. The lowest concentration of SARS-CoV-2 viral copies this assay can detect is 250 copies / mL. A negative result does not preclude SARS-CoV-2 infection and should not be used as the sole basis for treatment or other patient management decisions.  A negative result may occur with improper specimen collection / handling, submission of specimen other than nasopharyngeal swab, presence of viral mutation(s) within the areas targeted by this assay, and inadequate number of viral copies (<250 copies / mL). A negative result must be combined with clinical observations, patient history, and epidemiological information.  Fact Sheet for Patients:   StrictlyIdeas.no  Fact Sheet for Healthcare Providers: BankingDealers.co.za  This test is not yet  approved or  cleared by the [place] FDA and has been authorized for detection and/or diagnosis of SARS-CoV-2 by FDA under an Emergency Use Authorization (EUA).  This EUA will remain in effect (meaning this test can be used) for the duration of the COVID-19 declaration under Section 564(b)(1) of the Act, 21 U.S.C. section 360bbb-3(b)(1), unless the authorization is terminated or revoked sooner.  Performed at [facility], [place] [address], [place], [place] [address]       Radiology Studies: DG Chest 2 View  Result Date: 11/12/2019 CLINICAL DATA:  Chest pain and cough. EXAM: CHEST - 2 VIEW COMPARISON:  September 07, 2019 FINDINGS: Stable AICD device. The heart, hila, and mediastinum are normal. No pneumothorax. No nodules or masses. No focal infiltrates. IMPRESSION: No active cardiopulmonary disease. Electronically Signed   By: Dorise Bullion III M.D   On: 11/12/2019 13:31     Scheduled Meds: . amiodarone  200 mg Oral Q lunch  . carvedilol  12.5 mg Oral BID WC  . enoxaparin (LOVENOX) injection  40 mg Subcutaneous Q24H  . fluticasone furoate-vilanterol  1 puff Inhalation Daily  . [START ON 11/14/2019] ivabradine  7.5 mg Oral BID  WC  . montelukast  10 mg Oral QHS  . multivitamin with minerals  1 tablet Oral QHS  . pantoprazole  40 mg Oral Daily  . potassium chloride  40 mEq Oral Q6H  . sacubitril-valsartan  1 tablet Oral BID  . spironolactone  50 mg Oral Daily  . torsemide  40 mg Oral BID  . [START ON 11/15/2019] Vitamin D (Ergocalciferol)  50,000 Units Oral Q7 days   Continuous Infusions:   LOS: 0 days    Donnamae Jude, MD 11/13/2019 1:26 PM [phone] [facility] If 7PM-7AM, please contact night-coverage 11/13/2019, 1:26 PM

## 2019-11-13 NOTE — Progress Notes (Signed)
  Echocardiogram 2D Echocardiogram has been performed.  [name] 11/13/2019, 4:37 PM

## 2019-11-13 NOTE — Progress Notes (Addendum)
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  Over the last several years she has been followed in the HF clinic.   2-3 weeks ago treated for URI/cough,  by PCP. Treated with prednisone, antibiotics and inhalers. She started having cough after steroids stopped.   Presented to [facility] with chest discomfort squeezing under bilateral breast. CXR no active disease. HS Trop 4>5. Cardiology consulted and started IV lasix. Pertinent labs included: CRP 0.7>0.9, sed rate 19, HS Trop 4>5,  TSH 1.7, BNP 754, SARS 2 negative. Coxsackie virus pending. Started on colchicine.   [employer] Interrogation: Index 31 (has been in the 30s for a while_ Impedance down. Average heart rate at night 104. Acitivty < 1 hour per day.    Today she is complaining of nausea. No chest pain today.   Echo 6/15 35-40% Echo 6/17 25-30% Echo 12/17 20-25% Echo 8/18 EF 25% Echo 7/19: EF 50-55% Echo 6/20: EF 25-30%  Cath 6/17 revealed normal vessels, moderate elevation in pulmonary artery pressures, and LVEF 25-30%.   CPX 12/18 pVO2 14.0 (corrects to 23.0 for IBW) VeVCO2  32 RER 1.0  Review of Systems: [y] = yes, [ ]  = no   . General: Weight gain [ ] ; Weight loss [ ] ; Anorexia [ ] ; Fatigue [Y ]; Fever [ ] ; Chills [ ] ; Weakness [Y ]  . Cardiac: Chest pain/pressure [ ] ; Resting SOB [ ] ; Exertional SOB [ ] ; Orthopnea [ ] ; Pedal Edema [ ] ; Palpitations [ ] ; Syncope [ ] ; Presyncope [ ] ; Paroxysmal nocturnal dyspnea[ ]   . Pulmonary: Cough [Y ];  Wheezing[ ] ; Hemoptysis[ ] ; Sputum [ ] ; Snoring [ ]   . GI: Vomiting[ ] ; Dysphagia[ ] ; Melena[ ] ; Hematochezia [ ] ; Heartburn[ ] ; Abdominal pain [ ] ; Constipation [ ] ; Diarrhea [ ] ; BRBPR [ ]   . GU: Hematuria[ ] ; Dysuria [ ] ; Nocturia[ ]   . Vascular: Pain in legs with walking [ ] ; Pain in feet with lying flat [ ] ; Non-healing sores [ ] ; Stroke [ ] ; TIA [ ] ; Slurred speech [ ] ;  . Neuro: Headaches[ ] ; Vertigo[ ] ; Seizures[ ] ; Paresthesias[ ] ;Blurred vision [ ] ; Diplopia [ ] ; Vision changes [ ]   . Ortho/Skin: Arthritis [ ] ; Joint pain [Y ]; Muscle pain [ ] ; Joint swelling [ ] ; Back Pain [Y ]; Rash [ ]   . Psych: Depression[Y ]; Anxiety[Y ]  . Heme: Bleeding problems [ ] ; Clotting disorders [ ] ; Anemia [ ]   . Endocrine: Diabetes [Y ]; Thyroid dysfunction[ ]   Home Medications Prior to Admission medications   Medication Sig Start Date End Date Taking? Authorizing Provider  albuterol (PROAIR HFA) 108 (90 Base) MCG/ACT inhaler Inhale 1-2 puffs into the lungs every 6 (six) hours as needed for wheezing or shortness of breath. 08/17/19  Yes Biagio Borg, MD  albuterol (PROVENTIL) (2.5 MG/3ML) 0.083% nebulizer solution Take 3 mLs (2.5 mg total) by nebulization every 6 (six) hours as needed for wheezing or shortness of breath. 09/05/19  Yes Fenton Foy, NP  amiodarone (PACERONE) 200  MG tablet Take 1 tablet by mouth once daily Patient taking differently: Take 200 mg by mouth daily with lunch.  02/09/19  Yes Larey Dresser, MD  butalbital-acetaminophen-caffeine (FIORICET) 50-325-40 MG tablet TAKE 1 TO 2 TABLETS BY MOUTH EVERY 6 HOURS AS NEEDED FOR HEADACHE Patient taking differently: Take 1-2 tablets by mouth every 6 (six) hours as needed for headache or migraine.  12/05/18  Yes Hoyt Koch, MD  carvedilol (COREG) 6.25 MG tablet Take 1 tablet (6.25 mg total) by mouth 2 (two) times daily. 05/11/19 05/10/20 Yes Clegg, Amy D, NP  colestipol (COLESTID) 1 g tablet Take 2 tablets (2 g total) by mouth 2  (two) times daily. Patient taking differently: Take 2 g by mouth daily as needed (IBS).  02/10/19  Yes Hoyt Koch, MD  diclofenac sodium (VOLTAREN) 1 % GEL Apply 4 g topically 4 (four) times daily. Patient taking differently: Apply 4 g topically 4 (four) times daily as needed (pain).  06/23/18  Yes Hoyt Koch, MD  dicyclomine (BENTYL) 20 MG tablet Take 20 mg by mouth daily as needed for spasms.    Yes [provider]  fluticasone (FLONASE) 50 MCG/ACT nasal spray Place 2 sprays into both nostrils daily as needed for allergies. 07/25/18  Yes Hoyt Koch, MD  gabapentin (NEURONTIN) 100 MG capsule Take 100 mg by mouth 2 (two) times daily as needed (back spasms during the day).    Yes [provider]  gabapentin (NEURONTIN) 300 MG capsule Take 300 mg by mouth at bedtime as needed (back spasms).   Yes [provider]  Ibuprofen-Famotidine 800-26.6 MG TABS Take 1 tablet by mouth 3 (three) times daily as needed. Patient taking differently: Take 1 tablet by mouth 3 (three) times daily as needed (muscle pain).  01/17/18  Yes Hulan Saas M, DO  ivabradine (CORLANOR) 7.5 MG TABS tablet TAKE 1 TABLET (7.5 MG TOTAL) BY MOUTH 2 (TWO) TIMES DAILY WITH A MEAL. Patient taking differently: Take 7.5 mg by mouth daily with lunch.  02/15/19  Yes Bensimhon, Shaune Pascal, MD  lidocaine (LIDODERM) 5 % Place 1 patch onto the skin daily. Remove & Discard patch within 12 hours or as directed by MD Patient taking differently: Place 1 patch onto the skin 2 (two) times daily as needed (pain). Remove & Discard patch within 12 hours or as directed by MD 01/17/18  Yes Hoyt Koch, MD  montelukast (SINGULAIR) 10 MG tablet Take 1 tablet (10 mg total) by mouth at bedtime. 09/13/19  Yes Fenton Foy, NP  Multiple Vitamins-Minerals (MULTIVITAMIN GUMMIES WOMENS PO) Take 2 tablets by mouth at bedtime.    Yes [provider]  nitroGLYCERIN (NITROSTAT) 0.4 MG SL tablet  Place 1 tablet (0.4 mg total) under the tongue every 5 (five) minutes as needed for chest pain. 09/05/19  Yes Bensimhon, Shaune Pascal, MD  omeprazole (PRILOSEC) 20 MG capsule Take 20 mg by mouth daily as needed (for reflux symptoms).  09/09/17 11/22/19 Yes [provider]  potassium chloride (KLOR-CON) 10 MEQ tablet Take 20 mEq by mouth 2 (two) times daily. 10/31/19  Yes [provider]  sacubitril-valsartan (ENTRESTO) 97-103 MG Take 1 tablet by mouth 2 (two) times daily. 09/05/19  Yes Bensimhon, Shaune Pascal, MD  spironolactone (ALDACTONE) 50 MG tablet Take 1 tablet (50 mg total) by mouth daily. 05/10/19  Yes Bensimhon, Shaune Pascal, MD  SYMBICORT 160-4.5 MCG/ACT inhaler INHALE 2 PUFFS INTO THE LUNGS 2 (TWO) TIMES DAILY AS NEEDED. Patient taking  differently: Inhale 2 puffs into the lungs 2 (two) times daily as needed (shortness of breath/wheezing).  05/03/18  Yes Lyndal Pulley, DO  tiZANidine (ZANAFLEX) 4 MG tablet Take 4 mg by mouth every 8 (eight) hours as needed for muscle spasms.  12/02/18  Yes [provider]  torsemide (DEMADEX) 20 MG tablet Take 2 tablets (40 mg total) by mouth 2 (two) times daily. Patient taking differently: Take 20-40 mg by mouth See admin instructions. Take 2 tablets (40 mg) by mouth daily with lunch, may also take an additional tablet (20 mg) as needed for fluid/swelling 10/06/19  Yes Clegg, Amy D, NP  Ubrogepant (UBRELVY) 50 MG TABS Take 50 mg by mouth every 2 (two) hours as needed (migraine). Patient taking differently: Take 50 mg by mouth See admin instructions. Take one tablet (50 mg) by mouth at onset of migraine headache, may repeat in 30 minutes if still needed. 10/17/19  Yes Hoyt Koch, MD  Vitamin D, Ergocalciferol, (DRISDOL) 1.25 MG (50000 UT) CAPS capsule Take 1 capsule (50,000 Units total) by mouth every 7 (seven) days. Patient taking differently: Take 50,000 Units by mouth every Wednesday.  08/01/18  Yes Lyndal Pulley, DO  dapagliflozin  propanediol (FARXIGA) 10 MG TABS tablet Take 10 mg by mouth daily before breakfast. Patient not taking: Reported on 08/23/2019 05/11/19   Darrick Grinder D, NP  linaclotide Rolan Lipa) 145 MCG CAPS capsule Take 1 capsule (145 mcg total) by mouth daily before breakfast. Patient not taking: Reported on 08/23/2019 10/19/17   Pyrtle, Lajuan Lines, MD    Past Medical History: Past Medical History:  Diagnosis Date  . Acute on chronic systolic (congestive) heart failure ([place]) 04/29/2017  . Acute pain of right shoulder 06/16/2017  . AICD (automatic cardioverter/defibrillator) present   . Anxiety   . Arthritis    right shoulder   . Asthma   . CHF (congestive heart failure) ([place])   . Chronic combined systolic and diastolic heart failure ([place]) 03/05/2014  . Closed low lateral malleolus fracture 10/23/2013  . Cystitis 10/21/2017  . Depression   . Depression with anxiety 01/20/2013  . Diabetes mellitus without complication ([place])   . Diverticulosis   . Dyspnea    comes and goes intermittently mostly with exertion   . Essential hypertension    Prev followed by [facility]/ Cardiology   . Fibroid    age 3  . Gallstones   . Generalized abdominal cramping   . History of cardiomyopathy   . Hypertension   . IBS (irritable bowel syndrome)   . 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 Insomnia 05/07/2017  . Labral tear of shoulder 04/04/2015    Injected 04/04/2015 Injected 12/03/2015   . Migraine    "monthly" (08/03/2016)  . Myofascial pain 06/16/2017  . NICM (nonischemic cardiomyopathy) ([place]) 08/03/2016  . Nonallopathic lesion of lumbosacral region 11/16/2016  . Nonallopathic lesion of sacral region 11/16/2016  . Nonallopathic lesion of thoracic region 08/20/2014  . Nonspecific chest pain 04/28/2017  . OSA (obstructive sleep apnea) 01/02/2013   NPSG 2009:  AHI 9/hr. CPAP intolerance >> "smothering" Good tolerance of auto device (optimal pressure 12-13 on download).  - referred to Dr Gwenette Greet     . OSA on CPAP   . Ovarian cyst    1999; surgically removed  . Patellofemoral syndrome of both knees 10/16/2016  . 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 PVC (premature ventricular contraction) 06/23/2016  . Seizures ([place])    "  as a child" (08/03/2016)  . Termination of pregnancy    due to cardiac risk    Past Surgical History: Past Surgical History:  Procedure Laterality Date  . CARDIAC CATHETERIZATION N/A 11/11/2015   Procedure: Right/Left Heart Cath and Coronary Angiography;  Surgeon: Troy Sine, MD;  Location: [place] CV LAB;  Service: Cardiovascular;  Laterality: N/A;  . CARDIAC CATHETERIZATION  ~ 2015  . CARDIAC DEFIBRILLATOR PLACEMENT  08/03/2016  . CESAREAN SECTION  1999  . COLONOSCOPY WITH PROPOFOL N/A 04/21/2016   Procedure: COLONOSCOPY WITH PROPOFOL;  Surgeon: Jerene Bears, MD;  Location: [facility];  Service: Gastroenterology;  Laterality: N/A;  . ESOPHAGOGASTRODUODENOSCOPY (EGD) WITH PROPOFOL N/A 04/21/2016   Procedure: ESOPHAGOGASTRODUODENOSCOPY (EGD) WITH PROPOFOL;  Surgeon: Jerene Bears, MD;  Location: [facility];  Service: Gastroenterology;  Laterality: N/A;  . FOOT FRACTURE SURGERY Right ~ 2003  . FRACTURE SURGERY    . ICD IMPLANT N/A 08/03/2016   Procedure: ICD Implant;  Surgeon: Deboraha Sprang, MD;  Location: [place] CV LAB;  Service: Cardiovascular;  Laterality: N/A;  . LAPAROSCOPIC CHOLECYSTECTOMY  12/2006  . LAPAROSCOPIC GASTRIC SLEEVE RESECTION    . LAPAROSCOPY ABDOMEN DIAGNOSTIC  2008   "cut bile duct w/gallbladder OR; had to go in later & fix leak; hospitalized for 2 months"  . LEFT HEART CATHETERIZATION WITH CORONARY ANGIOGRAM N/A 02/26/2014   Procedure: LEFT HEART CATHETERIZATION WITH CORONARY ANGIOGRAM;  Surgeon: Jettie Booze, MD;  Location: [facility] CATH LAB;  Service: Cardiovascular;  Laterality: N/A;  . OVARIAN CYST REMOVAL Right 1999  . PVC ABLATION N/A 12/01/2017   Procedure: PVC ABLATION;  Surgeon: Evans Lance, MD;  Location: [place] CV LAB;  Service: Cardiovascular;  Laterality: N/A;  . RIGHT HEART CATH N/A 08/05/2017   Procedure: RIGHT HEART CATH;  Surgeon: Jolaine Artist, MD;  Location: [place] CV LAB;  Service: Cardiovascular;  Laterality: N/A;  . TUBAL LIGATION  1999    Family History: Family History  Problem Relation Age of Onset  . Emphysema Maternal Grandmother        smoked  . Heart disease Maternal Grandmother 80       MI  . Rheum arthritis Mother   . Allergies Daughter   . Colon cancer Neg Hx     Social History: Social History   Socioeconomic History  . Marital status: Married    Spouse name: Not on file  . Number of children: 1  . Years of education: Not on file  . Highest education level: Not on file  Occupational History  . Occupation: stay at home mom  Tobacco Use  . Smoking status: Never Smoker  . Smokeless tobacco: Never Used  Vaping Use  . Vaping Use: Never used  Substance and Sexual Activity  . Alcohol use: No  . Drug use: No  . Sexual activity: Yes  Other Topics Concern  . Not on file  Social History Narrative  . Not on file   Social Determinants of Health   Financial Resource Strain:   . Difficulty of Paying Living Expenses:   Food Insecurity: No Food Insecurity  . Worried About Charity fundraiser in the Last Year: Never true  . Ran Out of Food in the Last Year: Never true  Transportation Needs: No Transportation Needs  . Lack of Transportation (Medical): No  . Lack of Transportation (Non-Medical): No  Physical Activity:   . Days of Exercise per Week:   . Minutes of Exercise  per Session:   Stress:   . Feeling of Stress :   Social Connections:   . Frequency of Communication with Friends and Family:   . Frequency of Social Gatherings with Friends and Family:   . Attends Religious Services:   . Active Member of Clubs or Organizations:   . Attends Archivist Meetings:   Marland Kitchen Marital Status:     Allergies:  Allergies    Allergen Reactions  . Aspirin Anaphylaxis and Other (See Comments)    Wheezing,  . Vancomycin Other (See Comments)    "did something to my kidneys," PROGRESSED TO KIDNEY FAILURE!!  . Contrast Media [Iodinated Diagnostic Agents] Other (See Comments)    Multiple CT contrast studies done over 2 weeks caused ARF  . Avocado Itching and Swelling  . Ciprofloxacin Itching and Rash  . Sulfa Antibiotics Itching and Rash    Objective:    Vital Signs:   Temp:  [98 F (36.7 C)-98.6 F (37 C)] 98.6 F (37 C) (06/21 0443) Pulse Rate:  [34-124] 92 (06/21 0815) Resp:  [0-45] 18 (06/21 0815) BP: (93-151)/(67-105) 106/70 (06/21 0443) SpO2:  [91 %-100 %] 100 % (06/21 0815) Weight:  [96.5 kg-100.2 kg] 96.5 kg (06/21 0441)    Weight change: Filed Weights   11/12/19 1242 11/12/19 2302 11/13/19 0441  Weight: 100.2 kg 98.6 kg 96.5 kg    Intake/Output:   Intake/Output Summary (Last 24 hours) at 11/13/2019 0849 Last data filed at 11/13/2019 0600 Gross per 24 hour  Intake 222 ml  Output 2400 ml  Net -2178 ml      Physical Exam    General:  No resp difficulty HEENT: normal Neck: supple. JVP 5-6  . Carotids 2+ bilat; no bruits. No lymphadenopathy or thyromegaly appreciated. Cor: PMI nondisplaced. Regular rate & rhythm. No rubs, gallops or murmurs. Lungs: clear Abdomen: soft, nontender, nondistended. No hepatosplenomegaly. No bruits or masses. Good bowel sounds. Extremities: no cyanosis, clubbing, rash, edema Neuro: alert & orientedx3, cranial nerves grossly intact. moves all 4 extremities w/o difficulty. Affect pleasant   Telemetry   SR with PVCs 100s   EKG   ST 106 with PVCs    Labs   Basic Metabolic Panel: Recent Labs  Lab 11/12/19 1249 11/13/19 0131  NA 138 134*  K 3.5 3.2*  CL 107 98  CO2 23 26  GLUCOSE 120* 128*  BUN 8 6  CREATININE 0.77 0.81  CALCIUM 9.0 9.2  MG  --  1.7  1.7  PHOS  --  3.5    Liver Function Tests: No results for input(s): AST, ALT,  ALKPHOS, BILITOT, PROT, ALBUMIN in the last 168 hours. No results for input(s): LIPASE, AMYLASE in the last 168 hours. No results for input(s): AMMONIA in the last 168 hours.  CBC: Recent Labs  Lab 11/12/19 1249  WBC 7.6  HGB 9.6*  HCT 32.1*  MCV 79.3*  PLT 212    Cardiac Enzymes: No results for input(s): CKTOTAL, CKMB, CKMBINDEX, TROPONINI in the last 168 hours.  BNP: BNP (last 3 results) Recent Labs    11/12/19 1812  BNP 754.6*    ProBNP (last 3 results) No results for input(s): PROBNP in the last 8760 hours.   CBG: No results for input(s): GLUCAP in the last 168 hours.  Coagulation Studies: No results for input(s): LABPROT, INR in the last 72 hours.   Imaging   DG Chest 2 View  Result Date: 11/12/2019 CLINICAL DATA:  Chest pain and cough. EXAM: CHEST -  2 VIEW COMPARISON:  September 07, 2019 FINDINGS: Stable AICD device. The heart, hila, and mediastinum are normal. No pneumothorax. No nodules or masses. No focal infiltrates. IMPRESSION: No active cardiopulmonary disease. Electronically Signed   By: Dorise Bullion III M.D   On: 11/12/2019 13:31      Medications:     Current Medications: . amiodarone  200 mg Oral Q lunch  . carvedilol  12.5 mg Oral BID WC  . colchicine  0.6 mg Oral BID  . enoxaparin (LOVENOX) injection  40 mg Subcutaneous Q24H  . fluticasone furoate-vilanterol  1 puff Inhalation Daily  . furosemide  40 mg Intravenous BID  . ivabradine  7.5 mg Oral Q lunch  . montelukast  10 mg Oral QHS  . multivitamin with minerals  1 tablet Oral QHS  . pantoprazole  40 mg Oral Daily  . potassium chloride  20 mEq Oral BID  . sacubitril-valsartan  1 tablet Oral BID  . spironolactone  50 mg Oral Daily  . torsemide  40 mg Oral BID  . [START ON 11/15/2019] Vitamin D (Ergocalciferol)  50,000 Units Oral Q7 days     Infusions:     Assessment/Plan   1. Chest Pain, doubt ACS, ? pleuritic from cough - Had [facility] 2017 with normal coronaries.  -? Related to  cough -HS 4>5.  - CRP 0.7>0.9/Sed Rate 19  - Cox Sackie A/B pending.  - Started on colchicine- ? Nausea likely due to colchicine.   2. A/C Systolic HF, NICM peripartum  - Had [facility] 2017 with normal coronaries. CPX 04/2017  VO2 14 but corrects to 23 for IBW -Has [employer] ICD. Heart Logic Score 31, impedance down, night time heart rate 104 . -Most recent ECHO 2020. ECHO ordered -  Volume status improved after IV lasix. Restart torsemide 40 mg twice a day  - Continue carvedilol 12.5 mg twice a day, increased this admit.  - Continue ivabradine 7.5 mg twice a day  - SBP 100-110. Cut back entresto 49-51 mg twice a day  - Continue spiro 50 mg daily - Renal function stable.  - [facility].   3. PVCs Holter Monitor 11/09/17 with 30% PVCs with one primary morphology.  - S/p PVC ablation 11/2017 with Dr Lovena Le unsuccessful -Zio Patch 04/2019 > 17% PVCs and >8% couplets.   -Off flecainide with EF down and persistent PVCs. Changed to amio.  - Keep K > 4 and Mag >2  -K and Mag low. Supp K and Mag - Having frequent PVCs. Continue amio and higher dose of carvedilol.  -  4. DMII -Intolerant farxiga due to rash -on ssi  5. Obesity  Body mass index is 34.35 kg/m. Had gastic sleeve years ago.   Length of Stay: 0  Darrick Grinder, NP  11/13/2019, 8:49 AM  Advanced Heart Failure Team Pager [phone] (M-F; 7a - 4p)  Please contact [facility] for night-coverage after hours (4p -7a ) and weekends on [email or web address]  Patient seen with NP, agree with the above note.   She was admitted with pleuritic pain under left breast.  She was started on colchicine for ?pericarditis and given IV Lasix for volume overload. Still with frequent PVCs.   Today, she denies any dyspnea.  Still with pain under left breast if she coughs.  Inflammatory markers normal, HS-TnI normal. ECG not suggestive of pericarditis.  CXR clear.   General: NAD Neck: No JVD, no thyromegaly or thyroid nodule.   Lungs: Clear to auscultation  bilaterally with normal respiratory effort. CV: Nondisplaced PMI.  Heart regular S1/S2, no S3/S4, no murmur, no rub.  No peripheral edema.  No carotid bruit.  Normal pedal pulses.  Abdomen: Soft, nontender, no hepatosplenomegaly, no distention.  Skin: Intact without lesions or rashes.  Neurologic: Alert and oriented x 3.  Psych: Normal affect. Extremities: No clubbing or cyanosis.  HEENT: Normal.   Patient likely was admitted with CHF exacerbation, now volume status looks quite good.   - Agree with transtion back to po torsemide, do not think she needs further IV Lasix.   Frequent PVCs still, has had PVC ablation in the past and is on amiodarone.  - Continue amiodarone.  - Coreg increased to 12.5 mg bid this admission, she looks like she is having less PVCs now than when she got here.  - Replace K and Mg.   Pleuritic pain under left breast still present with cough but improved.  Suspect not acute pericarditis.  She is nauseated today, think she may be due to colchicine.  Would stop colchicine.   Loralie Champagne 11/13/2019 11:54 AM

## 2019-11-13 NOTE — Progress Notes (Signed)
No ICM remote transmission received for 11/13/2019 due to current inpatient hospitalization and next ICM transmission scheduled for 11/21/2019.

## 2019-11-14 DIAGNOSIS — I5023 Acute on chronic systolic (congestive) heart failure: Secondary | ICD-10-CM | POA: Diagnosis not present

## 2019-11-14 DIAGNOSIS — I428 Other cardiomyopathies: Secondary | ICD-10-CM | POA: Diagnosis not present

## 2019-11-14 DIAGNOSIS — I10 Essential (primary) hypertension: Secondary | ICD-10-CM | POA: Diagnosis not present

## 2019-11-14 DIAGNOSIS — R079 Chest pain, unspecified: Secondary | ICD-10-CM | POA: Diagnosis not present

## 2019-11-14 LAB — COXSACKIE A VIRUS ANTIBODIES
Coxsackie A16 IgG: 1:400 {titer} — ABNORMAL HIGH
Coxsackie A16 IgM: NEGATIVE titer
Coxsackie A24 IgG: 1:1600 {titer} — ABNORMAL HIGH
Coxsackie A24 IgM: NEGATIVE titer
Coxsackie A7 IgG: 1:400 {titer} — ABNORMAL HIGH
Coxsackie A7 IgM: NEGATIVE titer
Coxsackie A9 IgG: 1:800 {titer} — ABNORMAL HIGH
Coxsackie A9 IgM: NEGATIVE titer

## 2019-11-14 LAB — BASIC METABOLIC PANEL
Anion gap: 11 (ref 5–15)
BUN: 20 mg/dL (ref 6–20)
CO2: 25 mmol/L (ref 22–32)
Calcium: 9.3 mg/dL (ref 8.9–10.3)
Chloride: 100 mmol/L (ref 98–111)
Creatinine, Ser: 1.77 mg/dL — ABNORMAL HIGH (ref 0.44–1.00)
GFR calc Af Amer: 40 mL/min — ABNORMAL LOW (ref >60)
GFR calc non Af Amer: 34 mL/min — ABNORMAL LOW (ref >60)
Glucose, Bld: 98 mg/dL (ref 70–99)
Potassium: 4 mmol/L (ref 3.5–5.1)
Sodium: 136 mmol/L (ref 135–145)

## 2019-11-14 LAB — MAGNESIUM: Magnesium: 2.3 mg/dL (ref 1.7–2.4)

## 2019-11-14 MED ORDER — IPRATROPIUM-ALBUTEROL 0.5-2.5 (3) MG/3ML IN SOLN
3.0000 mL | Freq: Two times a day (BID) | RESPIRATORY_TRACT | Status: DC
  Administered 2019-11-14 – 2019-11-17 (×7): 3 mL via RESPIRATORY_TRACT
  Filled 2019-11-14 (×7): qty 3

## 2019-11-14 MED ORDER — PROMETHAZINE HCL 25 MG PO TABS
25.0000 mg | ORAL_TABLET | Freq: Four times a day (QID) | ORAL | Status: DC | PRN
  Administered 2019-11-14 – 2019-11-16 (×5): 25 mg via ORAL
  Filled 2019-11-14 (×5): qty 1

## 2019-11-14 NOTE — Progress Notes (Signed)
Patient ID: JAEDA [name], female   DOB: 1976/03/13, 45 y.o.   MRN: [id number]  PROGRESS NOTE    LARONICA BHAGAT  QIO:[id number] DOB: 1976-01-12 DOA: 11/12/2019 PCP: Hoyt Koch, MD    Brief Narrative:  44 y.o.femalewith medical history significant ofpostpartum cardiomyopathy with systolic CHFwith normal cath x2 in the past, status post AICD, frequent PVCs status post unsuccessful ablation, asthma, IBS,tented with new onset chest pain.Symptoms started last night before he went to bed, sharp like,persistent chest pain, localized, intensity8/10, worsened with lying down and deep breath,improved with sitting up which made her got upearly this morning around 4 AM,associated with short of breath but no sweating. She also has had more palpitations than usual. She has had frequent PVCs for years had unsuccessful ablation couple years ago, and has been on beta-blocker and amiodarone since. She occasionally feels short of breath associated with exertion, and she takes torsemide twice daily, meantime her AICD monitor intraventricular dynamic and remotely alarmed cardiology for additional as needed torsemide, which she has been doing very well volume status wise.About 2-3weeks ago patient was treated by PCPfor URI symptoms of congestion and productive cough. Symptoms resolved. Monitoring and EKG shows frequent PVCs occasional degenerate into short runV-Tach.Troponin hip. X-ray negative for any acute infiltrates.   Assessment & Plan:   Active Problems:   Essential hypertension   OSA (obstructive sleep apnea)   PVC (premature ventricular contraction)   NICM (nonischemic cardiomyopathy) (HCC)   Acute on chronic systolic (congestive) heart failure (HCC)   V-tach (HCC)  Atypical chest pain -Pleural like chest pain, with history of recent URI, suspect pericarditis. Cardiology already started patient on colchicine--d/c'd and will check echocardiogram. Also send ESR CRP  (negative) and coxsackie virus a and B type study. -ACS low suspicion.  Nonsustained V. Tach -Check electrolytes -Her blood pressure still allow titrate up beta-blocker which was done by cardiologist -Continue amiodarone -Monitor overnight in stepdown unit.Patient reports that her AICD hasnever discharged before.  Nonischemic cardiomyopathy/postpartum cardiomyopathy -She is euvolemic, will continue same home regimen of Entresto and diuresis regimen Cavedilol - increased dose to 12.5 bid Ivabradine 7.5 mg bid Entresto - decreased dose Spirinolactone 50 mg q d Torsemide 40 mg bid Echo 6/15 35-40% Echo 6/17 25-30% Echo 12/17 20-25% Echo 8/18 EF 25% Echo 7/19: EF 50-55% Echo 6/20: EF 25-30%  Asthma -No symptoms or signs of acute exacerbation  Migraine -Avoid NSAIDs  IBS -Continue home as needed regimen  Peripheral neuropathy -Continue gabapentin  Acute renal failure Scr 0.7-->1.7 -watch diuresis -avoid nephrotoxic agents  Morbid obesity s/p Gastric sleeve No evidence of DM at this time. CBGs are normal. Last A1C is 5.3  DVT prophylaxis: Lovenox SQ Code Status: Full code  Family Communication: Patient at bedside Disposition Plan: home  Consultants:   Cardiology   Procedures:  ECHO shows worsening EF at < 20%, global hypokinesis, LV dilation, left atrial dilation, normal valves  Antimicrobials: Anti-infectives (From admission, onward)   None     Subjective: Feels better. Pain is improved. Breathing feels better. Still feeling PVC's, less than on admission.  Objective: Vitals:   11/14/19 0746 11/14/19 0805 11/14/19 0843 11/14/19 1127  BP:  96/72  (!) 81/48  Pulse:  81 72 63  Resp:    18  Temp:    98.5 F (36.9 C)  TempSrc:    Oral  SpO2: 100% 98%  95%  Weight:      Height:        Intake/Output Summary (Last 24  hours) at 11/14/2019 1546 Last data filed at 11/14/2019 1423 Gross per 24 hour  Intake 600 ml  Output 875 ml  Net -275 ml    Filed Weights   11/12/19 2302 11/13/19 0441 11/14/19 0500  Weight: 98.6 kg 96.5 kg 97.1 kg    Examination:  General exam: Appears calm and comfortable  Respiratory system: Clear to auscultation. Respiratory effort normal. Cardiovascular system: S1 & S2 heard, irregular RR.  Gastrointestinal system: Abdomen is nondistended, soft and nontender.  Central nervous system: Alert and oriented. No focal neurological deficits. Extremities: Symmetric  Skin: No rashes Psychiatry: Judgement and insight appear normal. Mood & affect appropriate.    Data Reviewed: I have personally reviewed following labs and imaging studies  CBC: Recent Labs  Lab 11/12/19 1249  WBC 7.6  HGB 9.6*  HCT 32.1*  MCV 79.3*  PLT 161   Basic Metabolic Panel: Recent Labs  Lab 11/12/19 1249 11/13/19 0131 11/14/19 0609  NA 138 134* 136  K 3.5 3.2* 4.0  CL 107 98 100  CO2 23 26 25   GLUCOSE 120* 128* 98  BUN 8 6 20   CREATININE 0.77 0.81 1.77*  CALCIUM 9.0 9.2 9.3  MG  --  1.7  1.7 2.3  PHOS  --  3.5  --    GFR: Estimated Creatinine Clearance: 47.1 mL/min (A) (by C-G formula based on SCr of 1.77 mg/dL (H)).  Thyroid Function Tests: Recent Labs    11/12/19 1812  TSH 1.722    Recent Results (from the past 240 hour(s))  SARS Coronavirus 2 by RT PCR (hospital order, performed in [facility] hospital lab) Nasopharyngeal Nasopharyngeal Swab     Status: None   Collection Time: 11/12/19  6:12 PM   Specimen: Nasopharyngeal Swab  Result Value Ref Range Status   SARS Coronavirus 2 NEGATIVE NEGATIVE Final    Comment: (NOTE) SARS-CoV-2 target nucleic acids are NOT DETECTED.  The SARS-CoV-2 RNA is generally detectable in upper and lower respiratory specimens during the acute phase of infection. The lowest concentration of SARS-CoV-2 viral copies this assay can detect is 250 copies / mL. A negative result does not preclude SARS-CoV-2 infection and should not be used as the sole basis for treatment or  other patient management decisions.  A negative result may occur with improper specimen collection / handling, submission of specimen other than nasopharyngeal swab, presence of viral mutation(s) within the areas targeted by this assay, and inadequate number of viral copies (<250 copies / mL). A negative result must be combined with clinical observations, patient history, and epidemiological information.  Fact Sheet for Patients:   StrictlyIdeas.no  Fact Sheet for Healthcare Providers: BankingDealers.co.za  This test is not yet approved or  cleared by the [place] FDA and has been authorized for detection and/or diagnosis of SARS-CoV-2 by FDA under an Emergency Use Authorization (EUA).  This EUA will remain in effect (meaning this test can be used) for the duration of the COVID-19 declaration under Section 564(b)(1) of the Act, 21 U.S.C. section 360bbb-3(b)(1), unless the authorization is terminated or revoked sooner.  Performed at [facility], [place] [address] [place], [place] [address]       Radiology Studies: ECHOCARDIOGRAM COMPLETE  Result Date: 11/13/2019    ECHOCARDIOGRAM REPORT   Patient Name:   [name] Date of Exam: 11/13/2019 Medical Rec #:  [id number]      Height:       66.0 in Accession #:    [id number]  Weight:       212.8 lb Date of Birth:  Sep 15, 1975      BSA:          2.053 m Patient Age:    45 years       BP:           107/77 mmHg Patient Gender: F              HR:           88 bpm. Exam Location:  Inpatient Procedure: 2D Echo, Cardiac Doppler and Color Doppler Indications:    CHF-Acute Systolic  History:        Patient has prior history of Echocardiogram examinations, most                 recent 11/01/2018. CHF, Defibrillator; Risk Factors:Hypertension,                 Sleep Apnea and Diabetes.  Sonographer:    Clayton Lefort RDCS (AE) Referring Phys: [id number] [place]  1. Severe global  reduction in LV systolic function; mild LVE; moderate LAE.  2. Left ventricular ejection fraction, by estimation, is <20%. The left ventricle has severely decreased function. The left ventricle demonstrates global hypokinesis. The left ventricular internal cavity size was mildly dilated. Left ventricular diastolic parameters are indeterminate.  3. Right ventricular systolic function is normal. The right ventricular size is normal. There is normal pulmonary artery systolic pressure.  4. Left atrial size was moderately dilated.  5. The mitral valve is normal in structure. Trivial mitral valve regurgitation. No evidence of mitral stenosis.  6. The aortic valve is tricuspid. Aortic valve regurgitation is not visualized. No aortic stenosis is present.  7. The inferior vena cava is normal in size with greater than 50% respiratory variability, suggesting right atrial pressure of 3 mmHg. FINDINGS  Left Ventricle: Left ventricular ejection fraction, by estimation, is <20%. The left ventricle has severely decreased function. The left ventricle demonstrates global hypokinesis. The left ventricular internal cavity size was mildly dilated. There is no  left ventricular hypertrophy. Left ventricular diastolic parameters are indeterminate. Right Ventricle: The right ventricular size is normal.Right ventricular systolic function is normal. There is normal pulmonary artery systolic pressure. The tricuspid regurgitant velocity is 2.06 m/s, and with an assumed right atrial pressure of 3 mmHg, the estimated right ventricular systolic pressure is 53.6 mmHg. Left Atrium: Left atrial size was moderately dilated. Right Atrium: Right atrial size was normal in size. Pericardium: There is no evidence of pericardial effusion. Mitral Valve: The mitral valve is normal in structure. Normal mobility of the mitral valve leaflets. Trivial mitral valve regurgitation. No evidence of mitral valve stenosis. Tricuspid Valve: The tricuspid valve is normal  in structure. Tricuspid valve regurgitation is mild . No evidence of tricuspid stenosis. Aortic Valve: The aortic valve is tricuspid. Aortic valve regurgitation is not visualized. No aortic stenosis is present. Aortic valve mean gradient measures 3.0 mmHg. Aortic valve peak gradient measures 4.0 mmHg. Aortic valve area, by VTI measures 2.58 cm. Pulmonic Valve: The pulmonic valve was normal in structure. Pulmonic valve regurgitation is not visualized. No evidence of pulmonic stenosis. Aorta: The aortic root is normal in size and structure. Venous: The inferior vena cava is normal in size with greater than 50% respiratory variability, suggesting right atrial pressure of 3 mmHg. IAS/Shunts: No atrial level shunt detected by color flow Doppler. Additional Comments: Severe global reduction in LV systolic function; mild LVE; moderate LAE. A  pacer wire is visualized.  LEFT VENTRICLE PLAX 2D LVIDd:         5.80 cm LVIDs:         5.10 cm LV PW:         1.10 cm LV IVS:        1.00 cm LVOT diam:     2.30 cm LV SV:         54 LV SV Index:   26 LVOT Area:     4.15 cm  LV Volumes (MOD) LV vol d, MOD A2C: 133.0 ml LV vol d, MOD A4C: 171.0 ml LV vol s, MOD A2C: 91.5 ml LV vol s, MOD A4C: 119.0 ml LV SV MOD A2C:     41.5 ml LV SV MOD A4C:     171.0 ml LV SV MOD BP:      46.4 ml RIGHT VENTRICLE RV Basal diam:  2.10 cm RV S prime:     6.98 cm/s TAPSE (M-mode): 1.2 cm LEFT ATRIUM             Index       RIGHT ATRIUM           Index LA diam:        2.90 cm 1.41 cm/m  RA Area:     10.40 cm LA Vol (A2C):   81.3 ml 39.60 ml/m RA Volume:   17.80 ml  8.67 ml/m LA Vol (A4C):   84.1 ml 40.96 ml/m LA Biplane Vol: 84.7 ml 41.25 ml/m  AORTIC VALVE AV Area (Vmax):    2.98 cm AV Area (Vmean):   2.59 cm AV Area (VTI):     2.58 cm AV Vmax:           99.80 cm/s AV Vmean:          79.300 cm/s AV VTI:            0.209 m AV Peak Grad:      4.0 mmHg AV Mean Grad:      3.0 mmHg LVOT Vmax:         71.70 cm/s LVOT Vmean:        49.400 cm/s LVOT  VTI:          0.130 m LVOT/AV VTI ratio: 0.62  AORTA Ao Root diam: 3.60 cm Ao Asc diam:  3.20 cm TRICUSPID VALVE TR Peak grad:   17.0 mmHg TR Vmax:        206.00 cm/s  SHUNTS Systemic VTI:  0.13 m Systemic Diam: 2.30 cm Kirk Ruths MD Electronically signed by Kirk Ruths MD Signature Date/Time: 11/13/2019/5:01:58 PM    Final      Scheduled Meds: . amiodarone  200 mg Oral Q lunch  . carvedilol  12.5 mg Oral BID WC  . enoxaparin (LOVENOX) injection  40 mg Subcutaneous Q24H  . fluticasone furoate-vilanterol  1 puff Inhalation Daily  . ipratropium-albuterol  3 mL Nebulization BID  . ivabradine  7.5 mg Oral BID WC  . montelukast  10 mg Oral QHS  . multivitamin with minerals  1 tablet Oral QHS  . pantoprazole  40 mg Oral Daily  . [START ON 11/15/2019] Vitamin D (Ergocalciferol)  50,000 Units Oral Q7 days   Continuous Infusions:   LOS: 0 days    Donnamae Jude, MD 11/14/2019 3:46 PM [phone] [facility] If 7PM-7AM, please contact night-coverage 11/14/2019, 3:46 PM

## 2019-11-14 NOTE — Progress Notes (Addendum)
Advanced Heart Failure Rounding Note  PCP-Cardiologist: No primary care provider on file.   Subjective:    Yesterday switched to torsemide and entresto was cut back to 49-51 mg twice a day.   Creatinine trending up 0.7>1.7.   Feeling some better. Remains limited walking.     Objective:   Weight Range: 97.1 kg Body mass index is 34.56 kg/m.   Vital Signs:   Temp:  [97.7 F (36.5 C)-98.6 F (37 C)] 98.5 F (36.9 C) (06/22 1127) Pulse Rate:  [63-81] 63 (06/22 1127) Resp:  [18-20] 18 (06/22 1127) BP: (81-102)/(48-86) 81/48 (06/22 1127) SpO2:  [94 %-100 %] 95 % (06/22 1127) Weight:  [97.1 kg] 97.1 kg (06/22 0500) Last BM Date: 11/11/19  Weight change: Filed Weights   11/12/19 2302 11/13/19 0441 11/14/19 0500  Weight: 98.6 kg 96.5 kg 97.1 kg    Intake/Output:   Intake/Output Summary (Last 24 hours) at 11/14/2019 1344 Last data filed at 11/14/2019 0500 Gross per 24 hour  Intake 360 ml  Output 500 ml  Net -140 ml      Physical Exam    General:  Sitting in the chair. No resp difficulty HEENT: Normal Neck: Supple. JVP flat  Carotids 2+ bilat; no bruits. No lymphadenopathy or thyromegaly appreciated. Cor: PMI nondisplaced. Regular rate & rhythm. No rubs, gallops or murmurs. Lungs: Clear Abdomen: Soft, nontender, nondistended. No hepatosplenomegaly. No bruits or masses. Good bowel sounds. Extremities: No cyanosis, clubbing, rash, edema Neuro: Alert & orientedx3, cranial nerves grossly intact. moves all 4 extremities w/o difficulty. Affect pleasant   Telemetry   ST with frequent PVCs. PVCs.   EKG    n/a  Labs    CBC Recent Labs    11/12/19 1249  WBC 7.6  HGB 9.6*  HCT 32.1*  MCV 79.3*  PLT 161   Basic Metabolic Panel Recent Labs    11/13/19 0131 11/14/19 0609  NA 134* 136  K 3.2* 4.0  CL 98 100  CO2 26 25  GLUCOSE 128* 98  BUN 6 20  CREATININE 0.81 1.77*  CALCIUM 9.2 9.3  MG 1.7  1.7 2.3  PHOS 3.5  --    Liver Function Tests No  results for input(s): AST, ALT, ALKPHOS, BILITOT, PROT, ALBUMIN in the last 72 hours. No results for input(s): LIPASE, AMYLASE in the last 72 hours. Cardiac Enzymes No results for input(s): CKTOTAL, CKMB, CKMBINDEX, TROPONINI in the last 72 hours.  BNP: BNP (last 3 results) Recent Labs    11/12/19 1812  BNP 754.6*    ProBNP (last 3 results) No results for input(s): PROBNP in the last 8760 hours.   D-Dimer Recent Labs    11/12/19 1812  DDIMER 1.29*   Hemoglobin A1C No results for input(s): HGBA1C in the last 72 hours. Fasting Lipid Panel No results for input(s): CHOL, HDL, LDLCALC, TRIG, CHOLHDL, LDLDIRECT in the last 72 hours. Thyroid Function Tests Recent Labs    11/12/19 1812  TSH 1.722    Other results:   Imaging    ECHOCARDIOGRAM COMPLETE  Result Date: 11/13/2019    ECHOCARDIOGRAM REPORT   Patient Name:   [name] Date of Exam: 11/13/2019 Medical Rec #:  [id number]      Height:       66.0 in Accession #:    [id number]     Weight:       212.8 lb Date of Birth:  30-May-1975      BSA:  2.053 m Patient Age:    44 years       BP:           107/77 mmHg Patient Gender: F              HR:           88 bpm. Exam Location:  Inpatient Procedure: 2D Echo, Cardiac Doppler and Color Doppler Indications:    CHF-Acute Systolic  History:        Patient has prior history of Echocardiogram examinations, most                 recent 11/01/2018. CHF, Defibrillator; Risk Factors:Hypertension,                 Sleep Apnea and Diabetes.  Sonographer:    Clayton Lefort RDCS (AE) Referring Phys: [id number] [place]  1. Severe global reduction in LV systolic function; mild LVE; moderate LAE.  2. Left ventricular ejection fraction, by estimation, is <20%. The left ventricle has severely decreased function. The left ventricle demonstrates global hypokinesis. The left ventricular internal cavity size was mildly dilated. Left ventricular diastolic parameters are indeterminate.  3.  Right ventricular systolic function is normal. The right ventricular size is normal. There is normal pulmonary artery systolic pressure.  4. Left atrial size was moderately dilated.  5. The mitral valve is normal in structure. Trivial mitral valve regurgitation. No evidence of mitral stenosis.  6. The aortic valve is tricuspid. Aortic valve regurgitation is not visualized. No aortic stenosis is present.  7. The inferior vena cava is normal in size with greater than 50% respiratory variability, suggesting right atrial pressure of 3 mmHg. FINDINGS  Left Ventricle: Left ventricular ejection fraction, by estimation, is <20%. The left ventricle has severely decreased function. The left ventricle demonstrates global hypokinesis. The left ventricular internal cavity size was mildly dilated. There is no  left ventricular hypertrophy. Left ventricular diastolic parameters are indeterminate. Right Ventricle: The right ventricular size is normal.Right ventricular systolic function is normal. There is normal pulmonary artery systolic pressure. The tricuspid regurgitant velocity is 2.06 m/s, and with an assumed right atrial pressure of 3 mmHg, the estimated right ventricular systolic pressure is 49.4 mmHg. Left Atrium: Left atrial size was moderately dilated. Right Atrium: Right atrial size was normal in size. Pericardium: There is no evidence of pericardial effusion. Mitral Valve: The mitral valve is normal in structure. Normal mobility of the mitral valve leaflets. Trivial mitral valve regurgitation. No evidence of mitral valve stenosis. Tricuspid Valve: The tricuspid valve is normal in structure. Tricuspid valve regurgitation is mild . No evidence of tricuspid stenosis. Aortic Valve: The aortic valve is tricuspid. Aortic valve regurgitation is not visualized. No aortic stenosis is present. Aortic valve mean gradient measures 3.0 mmHg. Aortic valve peak gradient measures 4.0 mmHg. Aortic valve area, by VTI measures 2.58 cm.  Pulmonic Valve: The pulmonic valve was normal in structure. Pulmonic valve regurgitation is not visualized. No evidence of pulmonic stenosis. Aorta: The aortic root is normal in size and structure. Venous: The inferior vena cava is normal in size with greater than 50% respiratory variability, suggesting right atrial pressure of 3 mmHg. IAS/Shunts: No atrial level shunt detected by color flow Doppler. Additional Comments: Severe global reduction in LV systolic function; mild LVE; moderate LAE. A pacer wire is visualized.  LEFT VENTRICLE PLAX 2D LVIDd:         5.80 cm LVIDs:  5.10 cm LV PW:         1.10 cm LV IVS:        1.00 cm LVOT diam:     2.30 cm LV SV:         54 LV SV Index:   26 LVOT Area:     4.15 cm  LV Volumes (MOD) LV vol d, MOD A2C: 133.0 ml LV vol d, MOD A4C: 171.0 ml LV vol s, MOD A2C: 91.5 ml LV vol s, MOD A4C: 119.0 ml LV SV MOD A2C:     41.5 ml LV SV MOD A4C:     171.0 ml LV SV MOD BP:      46.4 ml RIGHT VENTRICLE RV Basal diam:  2.10 cm RV S prime:     6.98 cm/s TAPSE (M-mode): 1.2 cm LEFT ATRIUM             Index       RIGHT ATRIUM           Index LA diam:        2.90 cm 1.41 cm/m  RA Area:     10.40 cm LA Vol (A2C):   81.3 ml 39.60 ml/m RA Volume:   17.80 ml  8.67 ml/m LA Vol (A4C):   84.1 ml 40.96 ml/m LA Biplane Vol: 84.7 ml 41.25 ml/m  AORTIC VALVE AV Area (Vmax):    2.98 cm AV Area (Vmean):   2.59 cm AV Area (VTI):     2.58 cm AV Vmax:           99.80 cm/s AV Vmean:          79.300 cm/s AV VTI:            0.209 m AV Peak Grad:      4.0 mmHg AV Mean Grad:      3.0 mmHg LVOT Vmax:         71.70 cm/s LVOT Vmean:        49.400 cm/s LVOT VTI:          0.130 m LVOT/AV VTI ratio: 0.62  AORTA Ao Root diam: 3.60 cm Ao Asc diam:  3.20 cm TRICUSPID VALVE TR Peak grad:   17.0 mmHg TR Vmax:        206.00 cm/s  SHUNTS Systemic VTI:  0.13 m Systemic Diam: 2.30 cm Kirk Ruths MD Electronically signed by Kirk Ruths MD Signature Date/Time: 11/13/2019/5:01:58 PM    Final        Medications:     Scheduled Medications: . amiodarone  200 mg Oral Q lunch  . carvedilol  12.5 mg Oral BID WC  . enoxaparin (LOVENOX) injection  40 mg Subcutaneous Q24H  . fluticasone furoate-vilanterol  1 puff Inhalation Daily  . ipratropium-albuterol  3 mL Nebulization BID  . ivabradine  7.5 mg Oral BID WC  . montelukast  10 mg Oral QHS  . multivitamin with minerals  1 tablet Oral QHS  . pantoprazole  40 mg Oral Daily  . torsemide  40 mg Oral BID  . [START ON 11/15/2019] Vitamin D (Ergocalciferol)  50,000 Units Oral Q7 days     Infusions:   PRN Medications:  acetaminophen **OR** acetaminophen, albuterol, butalbital-acetaminophen-caffeine, colestipol, diclofenac sodium, dicyclomine, fluticasone, gabapentin, gabapentin, HYDROmorphone (DILAUDID) injection, lidocaine, ondansetron (ZOFRAN) IV, promethazine, tiZANidine      Assessment/Plan  1. Chest Pain, doubt ACS, ? pleuritic from cough - Had [facility] 2017 with normal coronaries.  -? Related to cough -HS 4>5.  -  CRP 0.7>0.9/Sed Rate 19  - [facility] A/B pending.  - Started on colchicine- ? Nausea likely due to colchicine.   2. A/C Systolic HF, NICM peripartum  - Had [facility] 2017 with normal coronaries. CPX 04/2017  VO2 14 but corrects to 23 for IBW -Has [employer] ICD. Heart Logic Score 31, impedance down, night time heart rate 104 . -Most recent ECHO 2020. ECHO with EF < 20%. RV ok. Will need to set up another CPX.  -Appear euvolemic. Renal function up. Hold torsemide.   - Continue carvedilol 12.5 mg twice a day, increased this admit.  - Continue ivabradine 7.5 mg twice a day  - Creatinine elevated. Stop entresto and spiro.  -Consult cardiac rehab.   3. PVCs Holter Monitor 11/09/17 with 30% PVCs with one primary morphology.  - S/p PVC ablation 11/2017 with Dr Lovena Le unsuccessful -Zio Patch 04/2019 > 17% PVCs and >8% couplets.   -Off flecainide with EF down and persistent PVCs. Changed to amio.  - Keep K > 4 and  Mag >2  -K and Mag low. Supp K and Mag - Having frequent PVCs.  - Continue amio and higher dose of carvedilol.   4. DMII -Intolerant farxiga due to rash -on ssi  5. Obesity  Body mass index is 34.56 kg/m. Had gastic sleeve years ago.   6. AKI  Creatinine up from 0.77>1.77 Stop spiro/entresto / torsemide.      Length of Stay: 0  Darrick Grinder, NP  11/14/2019, 1:44 PM  Advanced Heart Failure Team Pager [phone] (M-F; 7a - 4p)  Please contact [facility] for night-coverage after hours (4p -7a ) and weekends on [email or web address]  Patient seen and examined with the above-signed Advanced Practice Provider and/or Housestaff. I personally reviewed laboratory data, imaging studies and relevant notes. I independently examined the patient and formulated the important aspects of the plan. I have edited the note to reflect any of my changes or salient points. I have personally discussed the plan with the patient and/or family.  Difficult situation. Remains weak. Volume status improved but now appears overdiuresed with rising creatinine. On amiodarone but still  With frequent PVCs. EF remains < 20%   General:  Lying in bed . No resp difficulty HEENT: normal Neck: supple. no JVD. Carotids 2+ bilat; no bruits. No lymphadenopathy or thryomegaly appreciated. Cor: PMI nondisplaced. Regular rate & rhythm. With frequent ectopy.  No rubs, gallops or murmurs. Lungs: clear Abdomen: obesesoft, nontender, nondistended. No hepatosplenomegaly. No bruits or masses. Good bowel sounds. Extremities: no cyanosis, clubbing, rash, edema Neuro: alert & orientedx3, cranial nerves grossly intact. moves all 4 extremities w/o difficulty. Affect pleasant  Agree with holding diuretics and Entresto. If renal function not improving may need RHC,   Will requantify PVCs overnight on tele. If still > 10% on amio may need to consider repeat ablation. Agree with need for further risk stratification with CPX testing as  outpatient.   Glori Bickers, MD  10:38 PM

## 2019-11-14 NOTE — Progress Notes (Signed)
CARDIAC REHAB PHASE I   PRE:  Rate/Rhythm: 68SR PVCs  BP:  Supine: 74/55  Sitting: 88/68  Standing: 83/58   SaO2: 95%RA   [id number] Came to see pt to walk. Pt had gotten phenergan for nausea and was a little sleepy but willing to try to walk.BPs low as documented and pt felt weak after standing for BP. Had pt sit back down. Pt stated she has CHF booklet at home. Gave low sodium diets. Could answer questions re FR, 2000 mg sodium restriction and daily weights. Pt has attended CRP 2 in past and willing to do again when more stable. Encouraged pt to discuss with Dr Haroldine Laws at office visit when she has had opportunity to increase activity and referral can be made when appropriate. Graylon Good RN BSN 11/14/2019 2:54 PM    Graylon Good, RN BSN  11/14/2019 2:48 PM

## 2019-11-15 ENCOUNTER — Other Ambulatory Visit: Payer: Self-pay

## 2019-11-15 DIAGNOSIS — Z8261 Family history of arthritis: Secondary | ICD-10-CM | POA: Diagnosis not present

## 2019-11-15 DIAGNOSIS — Z6835 Body mass index (BMI) 35.0-35.9, adult: Secondary | ICD-10-CM | POA: Diagnosis not present

## 2019-11-15 DIAGNOSIS — J45909 Unspecified asthma, uncomplicated: Secondary | ICD-10-CM | POA: Diagnosis present

## 2019-11-15 DIAGNOSIS — Z8249 Family history of ischemic heart disease and other diseases of the circulatory system: Secondary | ICD-10-CM | POA: Diagnosis not present

## 2019-11-15 DIAGNOSIS — O94 Sequelae of complication of pregnancy, childbirth, and the puerperium: Secondary | ICD-10-CM | POA: Diagnosis not present

## 2019-11-15 DIAGNOSIS — N179 Acute kidney failure, unspecified: Secondary | ICD-10-CM | POA: Diagnosis not present

## 2019-11-15 DIAGNOSIS — Z79899 Other long term (current) drug therapy: Secondary | ICD-10-CM | POA: Diagnosis not present

## 2019-11-15 DIAGNOSIS — I428 Other cardiomyopathies: Secondary | ICD-10-CM | POA: Diagnosis present

## 2019-11-15 DIAGNOSIS — Z95810 Presence of automatic (implantable) cardiac defibrillator: Secondary | ICD-10-CM | POA: Diagnosis not present

## 2019-11-15 DIAGNOSIS — I10 Essential (primary) hypertension: Secondary | ICD-10-CM | POA: Diagnosis present

## 2019-11-15 DIAGNOSIS — G4733 Obstructive sleep apnea (adult) (pediatric): Secondary | ICD-10-CM | POA: Diagnosis present

## 2019-11-15 DIAGNOSIS — G43909 Migraine, unspecified, not intractable, without status migrainosus: Secondary | ICD-10-CM | POA: Diagnosis present

## 2019-11-15 DIAGNOSIS — M1990 Unspecified osteoarthritis, unspecified site: Secondary | ICD-10-CM | POA: Diagnosis present

## 2019-11-15 DIAGNOSIS — I493 Ventricular premature depolarization: Secondary | ICD-10-CM | POA: Diagnosis present

## 2019-11-15 DIAGNOSIS — R0789 Other chest pain: Secondary | ICD-10-CM | POA: Diagnosis present

## 2019-11-15 DIAGNOSIS — I5023 Acute on chronic systolic (congestive) heart failure: Secondary | ICD-10-CM | POA: Diagnosis present

## 2019-11-15 DIAGNOSIS — G629 Polyneuropathy, unspecified: Secondary | ICD-10-CM | POA: Diagnosis present

## 2019-11-15 DIAGNOSIS — K589 Irritable bowel syndrome without diarrhea: Secondary | ICD-10-CM | POA: Diagnosis present

## 2019-11-15 DIAGNOSIS — Z9884 Bariatric surgery status: Secondary | ICD-10-CM | POA: Diagnosis not present

## 2019-11-15 DIAGNOSIS — I472 Ventricular tachycardia: Secondary | ICD-10-CM | POA: Diagnosis present

## 2019-11-15 DIAGNOSIS — E876 Hypokalemia: Secondary | ICD-10-CM | POA: Diagnosis not present

## 2019-11-15 DIAGNOSIS — T502X5A Adverse effect of carbonic-anhydrase inhibitors, benzothiadiazides and other diuretics, initial encounter: Secondary | ICD-10-CM | POA: Diagnosis present

## 2019-11-15 DIAGNOSIS — Z20822 Contact with and (suspected) exposure to covid-19: Secondary | ICD-10-CM | POA: Diagnosis present

## 2019-11-15 LAB — BASIC METABOLIC PANEL
Anion gap: 10 (ref 5–15)
BUN: 28 mg/dL — ABNORMAL HIGH (ref 6–20)
CO2: 26 mmol/L (ref 22–32)
Calcium: 9 mg/dL (ref 8.9–10.3)
Chloride: 99 mmol/L (ref 98–111)
Creatinine, Ser: 1.81 mg/dL — ABNORMAL HIGH (ref 0.44–1.00)
GFR calc Af Amer: 38 mL/min — ABNORMAL LOW (ref >60)
GFR calc non Af Amer: 33 mL/min — ABNORMAL LOW (ref >60)
Glucose, Bld: 87 mg/dL (ref 70–99)
Potassium: 4 mmol/L (ref 3.5–5.1)
Sodium: 135 mmol/L (ref 135–145)

## 2019-11-15 MED ORDER — SENNOSIDES-DOCUSATE SODIUM 8.6-50 MG PO TABS
2.0000 | ORAL_TABLET | Freq: Every day | ORAL | Status: DC
  Filled 2019-11-15: qty 2

## 2019-11-15 MED ORDER — SODIUM CHLORIDE 0.9% FLUSH: 3.0000 mL | INTRAVENOUS | Status: DC | PRN

## 2019-11-15 MED ORDER — SODIUM CHLORIDE 0.9 % IV SOLN: INTRAVENOUS | Status: DC

## 2019-11-15 MED ORDER — SODIUM CHLORIDE 0.9% FLUSH
3.0000 mL | Freq: Two times a day (BID) | INTRAVENOUS | Status: DC
  Administered 2019-11-15 – 2019-11-16 (×3): 3 mL via INTRAVENOUS

## 2019-11-15 MED ORDER — SODIUM CHLORIDE 0.9 % IV SOLN: 250.0000 mL | INTRAVENOUS | Status: DC | PRN

## 2019-11-15 MED ORDER — ASPIRIN 81 MG PO CHEW
81.0000 mg | CHEWABLE_TABLET | ORAL | Status: AC
  Administered 2019-11-16: 81 mg via ORAL
  Filled 2019-11-15: qty 1

## 2019-11-15 NOTE — Progress Notes (Addendum)
Advanced Heart Failure Rounding Note  PCP-Cardiologist: No primary care provider on file.   Subjective:    Diuretics on hold for AKI 2/2 over diuresis. SCr 0.81>>1.77>>1.81.   Wt up 1 lb.   Feels tired. No dyspnea.   Continues to have frequent PVCs on tele ~18/hr. Symptomatic w/ palpitations.   Also complaining of nausea. Just got dose of zofran. QTc 440 ms. K 4.0. Mg 2.3.   [facility] A/B pending  Objective:   Weight Range: 97.6 kg Body mass index is 34.73 kg/m.   Vital Signs:   Temp:  [97.3 F (36.3 C)-98.3 F (36.8 C)] 98.1 F (36.7 C) (06/23 1108) Pulse Rate:  [64-74] 68 (06/23 1110) Resp:  [16-20] 18 (06/23 1110) BP: (86-116)/(63-86) 113/69 (06/23 1108) SpO2:  [94 %-100 %] 100 % (06/23 1108) Weight:  [97.6 kg] 97.6 kg (06/23 0457) Last BM Date: 11/11/19  Weight change: Filed Weights   11/13/19 0441 11/14/19 0500 11/15/19 0457  Weight: 96.5 kg 97.1 kg 97.6 kg    Intake/Output:   Intake/Output Summary (Last 24 hours) at 11/15/2019 1312 Last data filed at 11/15/2019 1100 Gross per 24 hour  Intake 720 ml  Output 1875 ml  Net -1155 ml      Physical Exam   General:  Well appearing, looks mildly fatigue, obese female. No respiratory difficulty HEENT: normal Neck: supple. no JVD. Carotids 2+ bilat; no bruits. No lymphadenopathy or thyromegaly appreciated. Cor: PMI nondisplaced. Irregular rhythm, regular rate No rubs, gallops or murmurs. Lungs: clear Abdomen: obese, soft, nontender, nondistended. No hepatosplenomegaly. No bruits or masses. Good bowel sounds. Extremities: no cyanosis, clubbing, rash, edema Neuro: alert & oriented x 3, cranial nerves grossly intact. moves all 4 extremities w/o difficulty. Affect pleasant.   Telemetry   NSR w/ frequent PVCs ~18/hr   EKG    n/a  Labs    CBC No results for input(s): WBC, NEUTROABS, HGB, HCT, MCV, PLT in the last 72 hours. Basic Metabolic Panel Recent Labs    11/13/19 0131 11/13/19 0131  11/14/19 0609 11/15/19 0335  NA 134*   < > 136 135  K 3.2*   < > 4.0 4.0  CL 98   < > 100 99  CO2 26   < > 25 26  GLUCOSE 128*   < > 98 87  BUN 6   < > 20 28*  CREATININE 0.81   < > 1.77* 1.81*  CALCIUM 9.2   < > 9.3 9.0  MG 1.7  1.7  --  2.3  --   PHOS 3.5  --   --   --    < > = values in this interval not displayed.   Liver Function Tests No results for input(s): AST, ALT, ALKPHOS, BILITOT, PROT, ALBUMIN in the last 72 hours. No results for input(s): LIPASE, AMYLASE in the last 72 hours. Cardiac Enzymes No results for input(s): CKTOTAL, CKMB, CKMBINDEX, TROPONINI in the last 72 hours.  BNP: BNP (last 3 results) Recent Labs    11/12/19 1812  BNP 754.6*    ProBNP (last 3 results) No results for input(s): PROBNP in the last 8760 hours.   D-Dimer Recent Labs    11/12/19 1812  DDIMER 1.29*   Hemoglobin A1C No results for input(s): HGBA1C in the last 72 hours. Fasting Lipid Panel No results for input(s): CHOL, HDL, LDLCALC, TRIG, CHOLHDL, LDLDIRECT in the last 72 hours. Thyroid Function Tests Recent Labs    11/12/19 1812  TSH 1.722  Other results:   Imaging    No results found.   Medications:     Scheduled Medications: . amiodarone  200 mg Oral Q lunch  . carvedilol  12.5 mg Oral BID WC  . enoxaparin (LOVENOX) injection  40 mg Subcutaneous Q24H  . fluticasone furoate-vilanterol  1 puff Inhalation Daily  . ipratropium-albuterol  3 mL Nebulization BID  . ivabradine  7.5 mg Oral BID WC  . montelukast  10 mg Oral QHS  . multivitamin with minerals  1 tablet Oral QHS  . pantoprazole  40 mg Oral Daily  . Vitamin D (Ergocalciferol)  50,000 Units Oral Q7 days    Infusions:   PRN Medications: acetaminophen **OR** acetaminophen, albuterol, butalbital-acetaminophen-caffeine, colestipol, diclofenac sodium, dicyclomine, fluticasone, gabapentin, gabapentin, HYDROmorphone (DILAUDID) injection, lidocaine, ondansetron (ZOFRAN) IV, promethazine,  tiZANidine      Assessment/Plan   1. Chest Pain, doubt ACS, ? pleuritic from cough - Had [facility] 2017 with normal coronaries.  - ? Related to cough - HS 4>5.  - CRP 0.7>0.9/Sed Rate 19  - [facility] A/B pending.  - currently CP free   2. A/C Systolic HF, NICM peripartum  - Had LHC 2017 with normal coronaries. CPX 04/2017  VO2 14 but corrects to 23 for IBW - Has [employer] ICD.  Most recent Heart Logic Score 31, impedance down, night time heart rate 104 . - Most recent ECHO 2020. ECHO with EF < 20%. RV ok. Will need to set up another CPX.  - Appears euvolemic. Renal function up. Continue to hold diuretics    - Continue carvedilol 12.5 mg twice a day, increased this admit.  - Continue ivabradine 7.5 mg twice a day  - Creatinine elevated. Hold entresto and spiro. - With rising SCr, concern for low output. Plan RHC tomorrow   3. PVCs - Holter Monitor 11/09/17 with 30% PVCs with one primary morphology.  - S/p PVC ablation 11/2017 with Dr Lovena Le unsuccessful - Zio Patch 04/2019 > 17% PVCs and >8% couplets.   - Off flecainide with EF down and persistent PVCs. Changed to amio.  - Keep K > 4 and Mag >2  - Having frequent PVCs ~18/hr  - Continue amio and higher dose of carvedilol.   4. DMII -Intolerant farxiga due to rash -on ssi  5. Obesity  - Body mass index is 34.56 kg/m. - Had gastic sleeve years ago.   6. AKI  - Creatinine up from 0.77>1.77>>1.81 - Continue to hold spiro/entresto / torsemide.  - ? Low output. Plan RHC tomorrow    Length of Stay: 0  Nelida Gores  11/15/2019, 1:12 PM  Advanced Heart Failure Team Pager [phone] (M-F; [place])  Please contact [facility] for night-coverage after hours (4p -7a ) and weekends on [email or web address]  Patient seen and examined with the above-signed Advanced Practice Provider and/or Housestaff. I personally reviewed laboratory data, imaging studies and relevant notes. I independently examined the patient and  formulated the important aspects of the plan. I have edited the note to reflect any of my changes or salient points. I have personally discussed the plan with the patient and/or family.  Continues to feel poorly. Very fatigued. Renal function worse. ~20% PVC burden.  General:  Weak appearing. No resp difficulty HEENT: normal Neck: supple. no JVD. Carotids 2+ bilat; no bruits. No lymphadenopathy or thryomegaly appreciated. Cor: PMI nondisplaced. Regular rate & rhythm Frequent ectopy . No rubs, gallops or murmurs. Lungs: clear Abdomen: obese soft, nontender, nondistended. No hepatosplenomegaly.  No bruits or masses. Good bowel sounds. Extremities: no cyanosis, clubbing, rash, edema Neuro: alert & orientedx3, cranial nerves grossly intact. moves all 4 extremities w/o difficulty. Affect pleasant  Concern for low output failure in setting of progressive PVC cardiomyopathy. Plan RHC in am to further evaluate. Will d/w EP.   Glori Bickers, MD  9:40 PM

## 2019-11-15 NOTE — Progress Notes (Signed)
Patient has an allergy to aspirin, she says it makes her wheeze, but not bad. Pt states she takes other medications with aspirin in it, and nothing happens, but when she takes aspirin alone- she starts to wheeze a little.  Spoke with pharmacy about allergy and will notify cath lab.

## 2019-11-15 NOTE — Progress Notes (Signed)
PROGRESS NOTE  [name]  DOB: 12/28/1975  PCP: Hoyt Koch, MD HAL:[id number]  DOA: 11/12/2019  LOS: 0 days   Chief Complaint  Patient presents with  . Cough  . Chest Pain   Brief narrative: Patient is a 44 y.o.femalewith PMH significant for postpartum cardiomyopathy with systolic CHFwith normal cath x2 in the past, status post AICD, frequent PVCs status post unsuccessful ablation, asthma, IBS. Patient presented to ED on 6/20 with new onset chest pain.Symptoms started the previous night before she went to bed, sharp,persistent chest pain, localized, intensity8/10, worsened with lying down and deep breath,improved with sitting up. She woke up upearly in the morning of admission with persistent chest pain, associated with short of breath and presented to ED.   She also has had more palpitations than usual. She has had frequent PVCs for years had unsuccessful ablation couple years ago, and has been on beta-blocker and amiodarone since. She occasionally feels short of breath associated with exertion, and she takes torsemide twice daily.  In the meantime, her AICD monitors intraventricular dynamic and remotely alarmed cardiology for additional as needed torsemide, which she has been doing very well volume status wise. About 2-3weeks prior to presentation, patient was treated by PCPfor URI symptoms of congestion and productive cough. Symptoms resolved.  Monitoring and EKG shows frequent PVCs occasional degenerate into short runV-Tach. Troponin hip. X-ray negative for any acute infiltrates.  Patient was admitted under hospitalist service.  Cardiology consultation obtained. Echocardiogram 6/22 with less than 20% EF, severe global hypokinesis. Coxsackievirus IgG antibodies positive, IgM negative.  Subjective: Patient was seen and examined this morning.  Placed African-American female.  Sitting up in chair.  Not in distress.  Feels better.  Not short of breath at  rest or on ambulation yesterday.  Chart reviewed. No fever, heart rate in 60s and 70s, blood pressure in 90s and 100s, breathing on room air. Creatinine trending up, in the last 2 days, it has gone up from 0.81-1.81 today  Assessment/Plan: Atypical chest pain -Pleural like chest pain, with history of recent URI, suspect pericarditis.  -Cardiology started the patient on colchicine.   -LHC in 2017 normal coronaries  -Coxsackie IgG positive, IgM negative  Acute exacerbation of systolic CHF Postpartum cardiomyopathy Nonsustained V. tach -Home meds include Coreg, amiodarone,  ivabradine, Entresto, Aldactone, torsemide. -Currently on Coreg, ivabradine and amiodarone.  Torsemide, Entresto and Aldactone on hold because of elevated creatinine. -[facility] 2017 with normal coronaries -Has an ICD in place. -Monitor electrolytes  Asthma -No symptoms or signs of acute exacerbation  Migraine -Avoid NSAIDs  IBS -Continue home as needed regimen  Peripheral neuropathy -Continue gabapentin  Acute renal failure Scr 0.7-> 1.81 -watch creatinine with diuresis  Morbid obesity s/p Gastric sleeve -No evidence of DM at this time. CBGs are normal. -Last A1C was 5.3      Body mass index is 34.73 kg/m. Mobility: Encourage ambulation Code Status:   Code Status: Full Code  DVT prophylaxis: enoxaparin (LOVENOX) injection 40 mg Start: 11/12/19 1830  Antimicrobials:  None Fluid: None  Diet:  Diet Order            Diet Heart Room service appropriate? Yes; Fluid consistency: Thin  Diet effective now                 Consultants: Cardiology Family Communication:  None at bedside  Status is: Inpatient  Remains inpatient appropriate because patient is getting diuresed, heart failure medicines are adjusted.   Dispo: The patient is  from: Home              Anticipated d/c is to: Home              Anticipated d/c date is: 2 days              Patient currently is not medically stable to  d/c.              Infusions:    Scheduled Meds: . amiodarone  200 mg Oral Q lunch  . carvedilol  12.5 mg Oral BID WC  . enoxaparin (LOVENOX) injection  40 mg Subcutaneous Q24H  . fluticasone furoate-vilanterol  1 puff Inhalation Daily  . ipratropium-albuterol  3 mL Nebulization BID  . ivabradine  7.5 mg Oral BID WC  . montelukast  10 mg Oral QHS  . multivitamin with minerals  1 tablet Oral QHS  . pantoprazole  40 mg Oral Daily  . Vitamin D (Ergocalciferol)  50,000 Units Oral Q7 days    Antimicrobials: Anti-infectives (From admission, onward)   None      PRN meds: acetaminophen **OR** acetaminophen, albuterol, butalbital-acetaminophen-caffeine, colestipol, diclofenac sodium, dicyclomine, fluticasone, gabapentin, gabapentin, HYDROmorphone (DILAUDID) injection, lidocaine, ondansetron (ZOFRAN) IV, promethazine, tiZANidine   Objective: Vitals:   11/15/19 1108 11/15/19 1110  BP: 113/69   Pulse: 66 68  Resp: 18 18  Temp: 98.1 F (36.7 C)   SpO2: 100%     Intake/Output Summary (Last 24 hours) at 11/15/2019 1319 Last data filed at 11/15/2019 1100 Gross per 24 hour  Intake 720 ml  Output 1875 ml  Net -1155 ml   Filed Weights   11/13/19 0441 11/14/19 0500 11/15/19 0457  Weight: 96.5 kg 97.1 kg 97.6 kg   Weight change: 0.499 kg Body mass index is 34.73 kg/m.   Physical Exam: General exam: Appears calm and comfortable.  Skin: No rashes, lesions or ulcers. HEENT: Atraumatic, normocephalic, supple neck, no obvious bleeding Lungs: Clear to auscultation bilaterally CVS: Regular rate and rhythm, no murmur GI/Abd soft, nontender, nondistended, bowel sounds CNS: Alert, awake, oriented x3 Psychiatry: Mood appropriate Extremities: No pedal edema, no calf tenderness  Data Review: I have personally reviewed the laboratory data and studies available.  Recent Labs  Lab 11/12/19 1249  WBC 7.6  HGB 9.6*  HCT 32.1*  MCV 79.3*  PLT 212   Recent Labs  Lab  11/12/19 1249 11/13/19 0131 11/14/19 0609 11/15/19 0335  NA 138 134* 136 135  K 3.5 3.2* 4.0 4.0  CL 107 98 100 99  CO2 23 26 25 26   GLUCOSE 120* 128* 98 87  BUN 8 6 20  28*  CREATININE 0.77 0.81 1.77* 1.81*  CALCIUM 9.0 9.2 9.3 9.0  MG  --  1.7  1.7 2.3  --   PHOS  --  3.5  --   --    Signed, Terrilee Croak, MD [facility] Pager: [phone] (Secure Chat preferred). 11/15/2019

## 2019-11-15 NOTE — H&P (View-Only) (Signed)
Advanced Heart Failure Rounding Note  PCP-Cardiologist: No primary care provider on file.   Subjective:    Diuretics on hold for AKI 2/2 over diuresis. SCr 0.81>>1.77>>1.81.   Wt up 1 lb.   Feels tired. No dyspnea.   Continues to have frequent PVCs on tele ~18/hr. Symptomatic w/ palpitations.   Also complaining of nausea. Just got dose of zofran. QTc 440 ms. K 4.0. Mg 2.3.   [facility] A/B pending  Objective:   Weight Range: 97.6 kg Body mass index is 34.73 kg/m.   Vital Signs:   Temp:  [97.3 F (36.3 C)-98.3 F (36.8 C)] 98.1 F (36.7 C) (06/23 1108) Pulse Rate:  [64-74] 68 (06/23 1110) Resp:  [16-20] 18 (06/23 1110) BP: (86-116)/(63-86) 113/69 (06/23 1108) SpO2:  [94 %-100 %] 100 % (06/23 1108) Weight:  [97.6 kg] 97.6 kg (06/23 0457) Last BM Date: 11/11/19  Weight change: Filed Weights   11/13/19 0441 11/14/19 0500 11/15/19 0457  Weight: 96.5 kg 97.1 kg 97.6 kg    Intake/Output:   Intake/Output Summary (Last 24 hours) at 11/15/2019 1312 Last data filed at 11/15/2019 1100 Gross per 24 hour  Intake 720 ml  Output 1875 ml  Net -1155 ml      Physical Exam   General:  Well appearing, looks mildly fatigue, obese female. No respiratory difficulty HEENT: normal Neck: supple. no JVD. Carotids 2+ bilat; no bruits. No lymphadenopathy or thyromegaly appreciated. Cor: PMI nondisplaced. Irregular rhythm, regular rate No rubs, gallops or murmurs. Lungs: clear Abdomen: obese, soft, nontender, nondistended. No hepatosplenomegaly. No bruits or masses. Good bowel sounds. Extremities: no cyanosis, clubbing, rash, edema Neuro: alert & oriented x 3, cranial nerves grossly intact. moves all 4 extremities w/o difficulty. Affect pleasant.   Telemetry   NSR w/ frequent PVCs ~18/hr   EKG    n/a  Labs    CBC No results for input(s): WBC, NEUTROABS, HGB, HCT, MCV, PLT in the last 72 hours. Basic Metabolic Panel Recent Labs    11/13/19 0131 11/13/19 0131  11/14/19 0609 11/15/19 0335  NA 134*   < > 136 135  K 3.2*   < > 4.0 4.0  CL 98   < > 100 99  CO2 26   < > 25 26  GLUCOSE 128*   < > 98 87  BUN 6   < > 20 28*  CREATININE 0.81   < > 1.77* 1.81*  CALCIUM 9.2   < > 9.3 9.0  MG 1.7   1.7  --  2.3  --   PHOS 3.5  --   --   --    < > = values in this interval not displayed.   Liver Function Tests No results for input(s): AST, ALT, ALKPHOS, BILITOT, PROT, ALBUMIN in the last 72 hours. No results for input(s): LIPASE, AMYLASE in the last 72 hours. Cardiac Enzymes No results for input(s): CKTOTAL, CKMB, CKMBINDEX, TROPONINI in the last 72 hours.  BNP: BNP (last 3 results) Recent Labs    11/12/19 1812  BNP 754.6*    ProBNP (last 3 results) No results for input(s): PROBNP in the last 8760 hours.   D-Dimer Recent Labs    11/12/19 1812  DDIMER 1.29*   Hemoglobin A1C No results for input(s): HGBA1C in the last 72 hours. Fasting Lipid Panel No results for input(s): CHOL, HDL, LDLCALC, TRIG, CHOLHDL, LDLDIRECT in the last 72 hours. Thyroid Function Tests Recent Labs    11/12/19 1812  TSH 1.722  Other results:   Imaging    No results found.   Medications:     Scheduled Medications:  amiodarone  200 mg Oral Q lunch   carvedilol  12.5 mg Oral BID WC   enoxaparin (LOVENOX) injection  40 mg Subcutaneous Q24H   fluticasone furoate-vilanterol  1 puff Inhalation Daily   ipratropium-albuterol  3 mL Nebulization BID   ivabradine  7.5 mg Oral BID WC   montelukast  10 mg Oral QHS   multivitamin with minerals  1 tablet Oral QHS   pantoprazole  40 mg Oral Daily   Vitamin D (Ergocalciferol)  50,000 Units Oral Q7 days    Infusions:   PRN Medications: acetaminophen **OR** acetaminophen, albuterol, butalbital-acetaminophen-caffeine, colestipol, diclofenac sodium, dicyclomine, fluticasone, gabapentin, gabapentin, HYDROmorphone (DILAUDID) injection, lidocaine, ondansetron (ZOFRAN) IV, promethazine,  tiZANidine      Assessment/Plan   1. Chest Pain, doubt ACS, ? pleuritic from cough - Had [facility] 2017 with normal coronaries.  - ? Related to cough - HS 4>5.  - CRP 0.7>0.9/Sed Rate 19  - [facility] A/B pending.  - currently CP free   2. A/C Systolic HF, NICM peripartum  - Had LHC 2017 with normal coronaries. CPX 04/2017  VO2 14 but corrects to 23 for IBW - Has [employer] ICD.  Most recent Heart Logic Score 31, impedance down, night time heart rate 104 . - Most recent ECHO 2020. ECHO with EF < 20%. RV ok. Will need to set up another CPX.  - Appears euvolemic. Renal function up. Continue to hold diuretics    - Continue carvedilol 12.5 mg twice a day, increased this admit.  - Continue ivabradine 7.5 mg twice a day  - Creatinine elevated. Hold entresto and spiro. - With rising SCr, concern for low output. Plan RHC tomorrow   3. PVCs - Holter Monitor 11/09/17 with 30% PVCs with one primary morphology.  - S/p PVC ablation 11/2017 with Dr Lovena Le unsuccessful - Zio Patch 04/2019 > 17% PVCs and >8% couplets.   - Off flecainide with EF down and persistent PVCs. Changed to amio.  - Keep K > 4 and Mag >2  - Having frequent PVCs ~18/hr  - Continue amio and higher dose of carvedilol.   4. DMII -Intolerant farxiga due to rash -on ssi  5. Obesity  - Body mass index is 34.56 kg/m. - Had gastic sleeve years ago.   6. AKI  - Creatinine up from 0.77>1.77>>1.81 - Continue to hold spiro/entresto / torsemide.  - ? Low output. Plan RHC tomorrow    Length of Stay: 0  Nelida Gores  11/15/2019, 1:12 PM  Advanced Heart Failure Team Pager [phone] (M-F; [place])  Please contact [facility] for night-coverage after hours (4p -7a ) and weekends on [email or web address]  Patient seen and examined with the above-signed Advanced Practice Provider and/or Housestaff. I personally reviewed laboratory data, imaging studies and relevant notes. I independently examined the patient and  formulated the important aspects of the plan. I have edited the note to reflect any of my changes or salient points. I have personally discussed the plan with the patient and/or family.  Continues to feel poorly. Very fatigued. Renal function worse. ~20% PVC burden.  General:  Weak appearing. No resp difficulty HEENT: normal Neck: supple. no JVD. Carotids 2+ bilat; no bruits. No lymphadenopathy or thryomegaly appreciated. Cor: PMI nondisplaced. Regular rate & rhythm Frequent ectopy . No rubs, gallops or murmurs. Lungs: clear Abdomen: obese soft, nontender, nondistended. No hepatosplenomegaly.  No bruits or masses. Good bowel sounds. Extremities: no cyanosis, clubbing, rash, edema Neuro: alert & orientedx3, cranial nerves grossly intact. moves all 4 extremities w/o difficulty. Affect pleasant  Concern for low output failure in setting of progressive PVC cardiomyopathy. Plan RHC in am to further evaluate. Will d/w EP.   Glori Bickers, MD  9:40 PM

## 2019-11-15 NOTE — Patient Outreach (Signed)
  [facility]) Care Management Chronic Special Needs Program    11/15/2019  Name: [name], DOB: 1976/04/20  MRN: [id number]   [name] is enrolled in a chronic special needs plan for Heart Failure.  Client presented to the hospital on 11/12/2019 with chest pain. Transition to inpatient status-acute on chronic systolic heart failure; nonsustained V-Tach. RNCM sent care plan to utilization management team.  Plan: RNCM will continue to follow for discharge disposition.  Thea Silversmith, RN, MSN, [facility] [phone]

## 2019-11-16 ENCOUNTER — Ambulatory Visit (HOSPITAL_COMMUNITY): Admission: RE | Admit: 2019-11-16 | Payer: HMO | Source: Home / Self Care | Admitting: Internal Medicine

## 2019-11-16 ENCOUNTER — Inpatient Hospital Stay (HOSPITAL_COMMUNITY)
Admission: EM | Disposition: A | Discharge status: Home / Self Care | Payer: Self-pay | Source: Home / Self Care | Attending: Internal Medicine

## 2019-11-16 ENCOUNTER — Encounter (HOSPITAL_COMMUNITY): Payer: Self-pay | Admitting: Internal Medicine

## 2019-11-16 HISTORY — PX: RIGHT HEART CATH: CATH118263

## 2019-11-16 LAB — CBC
HCT: 35.7 % — ABNORMAL LOW (ref 36.0–46.0)
Hemoglobin: 10.4 g/dL — ABNORMAL LOW (ref 12.0–15.0)
MCH: 23.1 pg — ABNORMAL LOW (ref 26.0–34.0)
MCHC: 29.1 g/dL — ABNORMAL LOW (ref 30.0–36.0)
MCV: 79.2 fL — ABNORMAL LOW (ref 80.0–100.0)
Platelets: 249 10*3/uL (ref 150–400)
RBC: 4.51 MIL/uL (ref 3.87–5.11)
RDW: 15.5 % (ref 11.5–15.5)
WBC: 6.3 10*3/uL (ref 4.0–10.5)
nRBC: 0 % (ref 0.0–0.2)

## 2019-11-16 LAB — CREATININE, SERUM
Creatinine, Ser: 1.15 mg/dL — ABNORMAL HIGH (ref 0.44–1.00)
GFR calc Af Amer: >60 mL/min (ref >60)
GFR calc non Af Amer: 57 mL/min — ABNORMAL LOW (ref >60)

## 2019-11-16 LAB — POCT I-STAT EG7
Acid-Base Excess: 3 mmol/L — ABNORMAL HIGH (ref 0.0–2.0)
Bicarbonate: 28.3 mmol/L — ABNORMAL HIGH (ref 20.0–28.0)
Calcium, Ion: 1.26 mmol/L (ref 1.15–1.40)
HCT: 33 % — ABNORMAL LOW (ref 36.0–46.0)
Hemoglobin: 11.2 g/dL — ABNORMAL LOW (ref 12.0–15.0)
O2 Saturation: 69 %
Potassium: 4.3 mmol/L (ref 3.5–5.1)
Sodium: 137 mmol/L (ref 135–145)
TCO2: 30 mmol/L (ref 22–32)
pCO2, Ven: 47.8 mmHg (ref 44.0–60.0)
pH, Ven: 7.379 (ref 7.250–7.430)
pO2, Ven: 37 mmHg (ref 32.0–45.0)

## 2019-11-16 LAB — BASIC METABOLIC PANEL
Anion gap: 8 (ref 5–15)
BUN: 21 mg/dL — ABNORMAL HIGH (ref 6–20)
CO2: 26 mmol/L (ref 22–32)
Calcium: 9.1 mg/dL (ref 8.9–10.3)
Chloride: 101 mmol/L (ref 98–111)
Creatinine, Ser: 1.2 mg/dL — ABNORMAL HIGH (ref 0.44–1.00)
GFR calc Af Amer: >60 mL/min (ref >60)
GFR calc non Af Amer: 55 mL/min — ABNORMAL LOW (ref >60)
Glucose, Bld: 97 mg/dL (ref 70–99)
Potassium: 4.4 mmol/L (ref 3.5–5.1)
Sodium: 135 mmol/L (ref 135–145)

## 2019-11-16 SURGERY — RIGHT HEART CATH
Anesthesia: LOCAL

## 2019-11-16 MED ORDER — HYDRALAZINE HCL 20 MG/ML IJ SOLN: 10.0000 mg | INTRAMUSCULAR | Status: AC | PRN

## 2019-11-16 MED ORDER — LIDOCAINE HCL (PF) 1 % IJ SOLN
INTRAMUSCULAR | Status: AC
  Filled 2019-11-16: qty 30

## 2019-11-16 MED ORDER — ACETAMINOPHEN 325 MG PO TABS: 650.0000 mg | ORAL_TABLET | ORAL | Status: DC | PRN

## 2019-11-16 MED ORDER — LABETALOL HCL 5 MG/ML IV SOLN: 10.0000 mg | INTRAVENOUS | Status: AC | PRN

## 2019-11-16 MED ORDER — SODIUM CHLORIDE 0.9% FLUSH: 3.0000 mL | Freq: Two times a day (BID) | INTRAVENOUS | Status: DC

## 2019-11-16 MED ORDER — ONDANSETRON HCL 4 MG/2ML IJ SOLN
4.0000 mg | Freq: Four times a day (QID) | INTRAMUSCULAR | Status: DC | PRN
  Administered 2019-11-17: 4 mg via INTRAVENOUS
  Filled 2019-11-16: qty 2

## 2019-11-16 MED ORDER — HEPARIN (PORCINE) IN NACL 1000-0.9 UT/500ML-% IV SOLN
INTRAVENOUS | Status: AC
  Filled 2019-11-16: qty 500

## 2019-11-16 MED ORDER — SODIUM CHLORIDE 0.9% FLUSH: 3.0000 mL | INTRAVENOUS | Status: DC | PRN

## 2019-11-16 MED ORDER — HEPARIN (PORCINE) IN NACL 1000-0.9 UT/500ML-% IV SOLN
INTRAVENOUS | Status: DC | PRN
  Administered 2019-11-16: 500 mL

## 2019-11-16 MED ORDER — LIDOCAINE HCL (PF) 1 % IJ SOLN
INTRAMUSCULAR | Status: DC | PRN
  Administered 2019-11-16: 1 mL

## 2019-11-16 MED ORDER — ENOXAPARIN SODIUM 40 MG/0.4ML ~~LOC~~ SOLN
40.0000 mg | SUBCUTANEOUS | Status: DC
  Administered 2019-11-17: 40 mg via SUBCUTANEOUS
  Filled 2019-11-16: qty 0.4

## 2019-11-16 MED ORDER — SODIUM CHLORIDE 0.9 % IV SOLN: 250.0000 mL | INTRAVENOUS | Status: DC | PRN

## 2019-11-16 SURGICAL SUPPLY — 7 items
CATH SWAN GANZ 7F STRAIGHT (CATHETERS) ×2 IMPLANT
GLIDESHEATH SLENDER 7FR .021G (SHEATH) ×2 IMPLANT
PACK CARDIAC CATHETERIZATION (CUSTOM PROCEDURE TRAY) ×2 IMPLANT
SHEATH GLIDE SLENDER 4/5FR (SHEATH) IMPLANT
TRANSDUCER W/STOPCOCK (MISCELLANEOUS) ×2 IMPLANT
TUBING ART PRESS 72  MALE/FEM (TUBING) ×2
TUBING ART PRESS 72 MALE/FEM (TUBING) ×1 IMPLANT

## 2019-11-16 NOTE — Interval H&P Note (Signed)
History and Physical Interval Note:  11/16/2019 11:11 AM  [name]  has presented today for surgery, with the diagnosis of heart failure.  The various methods of treatment have been discussed with the patient and family. After consideration of risks, benefits and other options for treatment, the patient has consented to  Procedure(s): RIGHT HEART CATH (N/A) as a surgical intervention.  The patient's history has been reviewed, patient examined, no change in status, stable for surgery.  I have reviewed the patient's chart and labs.  Questions were answered to the patient's satisfaction.     Kapil Petropoulos

## 2019-11-16 NOTE — Plan of Care (Signed)

## 2019-11-16 NOTE — Plan of Care (Signed)

## 2019-11-16 NOTE — Progress Notes (Signed)
PROGRESS NOTE  [name]  DOB: 1976-04-27  PCP: Hoyt Koch, MD ATF:[id number]  DOA: 11/12/2019  LOS: 1 day   Chief Complaint  Patient presents with  . Cough  . Chest Pain   Brief narrative: Patient is a 44 y.o.femalewith PMH significant for postpartum cardiomyopathy with systolic CHFwith normal cath x2 in the past, status post AICD, frequent PVCs status post unsuccessful ablation, asthma, IBS. Patient presented to ED on 6/20 with new onset chest pain.Symptoms started the previous night before she went to bed, sharp,persistent chest pain, localized, intensity8/10, worsened with lying down and deep breath,improved with sitting up. She woke up upearly in the morning of admission with persistent chest pain, associated with short of breath and presented to ED.   She also has had more palpitations than usual. She has had frequent PVCs for years had unsuccessful ablation couple years ago, and has been on beta-blocker and amiodarone since. She occasionally feels short of breath associated with exertion, and she takes torsemide twice daily.  In the meantime, her AICD monitors intraventricular dynamic and remotely alarmed cardiology for additional as needed torsemide, which she has been doing very well volume status wise. About 2-3weeks prior to presentation, patient was treated by PCPfor URI symptoms of congestion and productive cough. Symptoms resolved.  Monitoring and EKG shows frequent PVCs occasional degenerate into short runV-Tach. Troponin hip. X-ray negative for any acute infiltrates.  Patient was admitted under hospitalist service.  Cardiology consultation obtained. Echocardiogram 6/22 with less than 20% EF, severe global hypokinesis. Coxsackievirus IgG antibodies positive, IgM negative.  Subjective: Patient was seen and examined this morning.  Pleasant African-American female.   No acute distress.  No new symptoms.  Chart reviewed. No fever, heart rate  in 60s and 70s, blood pressure in the low 100s  Creatinine seems better this morning at 1.2.  Assessment/Plan: Atypical chest pain -Pleural like chest pain, with history of recent URI, suspect pericarditis.  -Cardiology started the patient on colchicine.   -LHC in 2017 normal coronaries  -Coxsackie IgG positive, IgM negative  Acute exacerbation of systolic CHF Postpartum cardiomyopathy Nonsustained V. tach -Home meds include Coreg, amiodarone,  ivabradine, Entresto, Aldactone, torsemide. -Currently on Coreg, ivabradine and amiodarone.   -Torsemide, Entresto and Aldactone on hold because of elevated creatinine. -[place] 2017 with normal coronaries -Noted a plan to do right heart cath today. -Has an ICD in place. -Monitor electrolytes  Asthma -No symptoms or signs of acute exacerbation  Migraine -Avoid NSAIDs  IBS -Continue home as needed regimen  Peripheral neuropathy -Continue gabapentin  Acute kidney injury Scr 0.7-> 1.81 > 1.2 -watch creatinine with diuresis  Morbid obesity s/p Gastric sleeve -No evidence of DM at this time. CBGs are normal. -Last A1C was 5.3      Body mass index is 34.83 kg/m. Mobility: Encourage ambulation Code Status:   Code Status: Full Code  DVT prophylaxis: enoxaparin (LOVENOX) injection 40 mg Start: 11/12/19 1830  Antimicrobials:  None Fluid: None  Diet:  Diet Order    None      Consultants: Cardiology Family Communication:  None at bedside  Status is: Inpatient  Remains inpatient appropriate because patient is getting diuresed, heart failure medicines are adjusted.  Undergoing right heart cath today. Dispo: The patient is from: Home              Anticipated d/c is to: Home              Anticipated d/c date is: 2 days  Patient currently is not medically stable to d/c.  Infusions:    Scheduled Meds: . amiodarone  200 mg Oral Q lunch  . carvedilol  12.5 mg Oral BID WC  . enoxaparin (LOVENOX) injection  40  mg Subcutaneous Q24H  . fluticasone furoate-vilanterol  1 puff Inhalation Daily  . ipratropium-albuterol  3 mL Nebulization BID  . ivabradine  7.5 mg Oral BID WC  . montelukast  10 mg Oral QHS  . multivitamin with minerals  1 tablet Oral QHS  . pantoprazole  40 mg Oral Daily  . senna-docusate  2 tablet Oral QHS  . sodium chloride flush  3 mL Intravenous Q12H  . Vitamin D (Ergocalciferol)  50,000 Units Oral Q7 days    Antimicrobials: Anti-infectives (From admission, onward)   None      PRN meds: acetaminophen **OR** acetaminophen, albuterol, butalbital-acetaminophen-caffeine, colestipol, diclofenac sodium, dicyclomine, fluticasone, gabapentin, gabapentin, HYDROmorphone (DILAUDID) injection, lidocaine, ondansetron (ZOFRAN) IV, promethazine, tiZANidine   Objective: Vitals:   11/16/19 1122 11/16/19 1127  BP: 115/79 111/70  Pulse: 71 65  Resp: 11 18  Temp:    SpO2: 98% (!) 0%    Intake/Output Summary (Last 24 hours) at 11/16/2019 1154 Last data filed at 11/16/2019 0618 Gross per 24 hour  Intake 722.51 ml  Output 1950 ml  Net -1227.49 ml   Filed Weights   11/14/19 0500 11/15/19 0457 11/16/19 0538  Weight: 97.1 kg 97.6 kg 97.9 kg   Weight change: 0.272 kg Body mass index is 34.83 kg/m.   Physical Exam: General exam: Appears calm and comfortable.  Skin: No rashes, lesions or ulcers. HEENT: Atraumatic, normocephalic, supple neck, no obvious bleeding Lungs: Clear to auscultation bilaterally CVS: Regular rate and rhythm, no murmur GI/Abd soft, nontender, nondistended, bowel sounds CNS: Alert, awake, oriented x3 Psychiatry: Mood appropriate Extremities: No pedal edema, no calf tenderness  Data Review: I have personally reviewed the laboratory data and studies available.  Recent Labs  Lab 11/12/19 1249  WBC 7.6  HGB 9.6*  HCT 32.1*  MCV 79.3*  PLT 212   Recent Labs  Lab 11/12/19 1249 11/13/19 0131 11/14/19 0609 11/15/19 0335 11/16/19 0436  NA 138 134* 136  135 135  K 3.5 3.2* 4.0 4.0 4.4  CL 107 98 100 99 101  CO2 23 26 25 26 26   GLUCOSE 120* 128* 98 87 97  BUN 8 6 20  28* 21*  CREATININE 0.77 0.81 1.77* 1.81* 1.20*  CALCIUM 9.0 9.2 9.3 9.0 9.1  MG  --  1.7  1.7 2.3  --   --   PHOS  --  3.5  --   --   --    Signed, Terrilee Croak, MD [facility] Pager: [phone] (Secure Chat preferred). 11/16/2019

## 2019-11-16 NOTE — Progress Notes (Signed)
Advanced Heart Failure Rounding Note  PCP-Cardiologist: No primary care provider on file.   Subjective:    Feeling better. No SOB. Still fatigued.   Creatinine improved with holding diuretics  RHC today  Findings:  RA = 4 RV = 32/6 PA = 31/8 (21) PCW = 12 Fick cardiac output/index = 7.0/3.4 Thermo CO/CI = 5.2/2.5 PVR =1.7 (Thermo) Ao sat = 98% PA sat = 67%, 69%  Assessment: 1. Well-compensated volume status and outputs  Plan/Discussion:  Continue medical therapy. Suspect renal function was elevated due to overdiuresis.     Objective:   Weight Range: 97.9 kg Body mass index is 34.83 kg/m.   Vital Signs:   Temp:  [98.2 F (36.8 C)-99.2 F (37.3 C)] 99.1 F (37.3 C) (06/24 0538) Pulse Rate:  [20-72] 65 (06/24 1127) Resp:  [8-20] 18 (06/24 1127) BP: (85-118)/(53-80) 111/70 (06/24 1127) SpO2:  [0 %-100 %] 0 % (06/24 1127) Weight:  [97.9 kg] 97.9 kg (06/24 0538) Last BM Date: 11/11/19  Weight change: Filed Weights   11/14/19 0500 11/15/19 0457 11/16/19 0538  Weight: 97.1 kg 97.6 kg 97.9 kg    Intake/Output:   Intake/Output Summary (Last 24 hours) at 11/16/2019 1202 Last data filed at 11/16/2019 0618 Gross per 24 hour  Intake 722.51 ml  Output 1950 ml  Net -1227.49 ml      Physical Exam   General:  Well appearing. No resp difficulty HEENT: normal Neck: supple. no JVD. Carotids 2+ bilat; no bruits. No lymphadenopathy or thryomegaly appreciated. Cor: PMI nondisplaced. Regular rate & rhythm. +PVCs No rubs, gallops or murmurs. Lungs: clear Abdomen: obese soft, nontender, nondistended. No hepatosplenomegaly. No bruits or masses. Good bowel sounds. Extremities: no cyanosis, clubbing, rash, edema Neuro: alert & orientedx3, cranial nerves grossly intact. moves all 4 extremities w/o difficulty. Affect pleasant    Telemetry    with PVCs. Personally reviewed   Labs    CBC No results for input(s): WBC, NEUTROABS, HGB, HCT, MCV, PLT in the last  72 hours. Basic Metabolic Panel Recent Labs    11/14/19 0609 11/14/19 0609 11/15/19 0335 11/16/19 0436  NA 136   < > 135 135  K 4.0   < > 4.0 4.4  CL 100   < > 99 101  CO2 25   < > 26 26  GLUCOSE 98   < > 87 97  BUN 20   < > 28* 21*  CREATININE 1.77*   < > 1.81* 1.20*  CALCIUM 9.3   < > 9.0 9.1  MG 2.3  --   --   --    < > = values in this interval not displayed.   Liver Function Tests No results for input(s): AST, ALT, ALKPHOS, BILITOT, PROT, ALBUMIN in the last 72 hours. No results for input(s): LIPASE, AMYLASE in the last 72 hours. Cardiac Enzymes No results for input(s): CKTOTAL, CKMB, CKMBINDEX, TROPONINI in the last 72 hours.  BNP: BNP (last 3 results) Recent Labs    11/12/19 1812  BNP 754.6*    ProBNP (last 3 results) No results for input(s): PROBNP in the last 8760 hours.   D-Dimer No results for input(s): DDIMER in the last 72 hours. Hemoglobin A1C No results for input(s): HGBA1C in the last 72 hours. Fasting Lipid Panel No results for input(s): CHOL, HDL, LDLCALC, TRIG, CHOLHDL, LDLDIRECT in the last 72 hours. Thyroid Function Tests No results for input(s): TSH, T4TOTAL, T3FREE, THYROIDAB in the last 72 hours.  Invalid input(s): FREET3  Other results:   Imaging    CARDIAC CATHETERIZATION  Result Date: 11/16/2019 Findings: RA = 4 RV = 32/6 PA = 31/8 (21) PCW = 12 Fick cardiac output/index = 7.0/3.4 Thermo CO/CI = 5.2/2.5 PVR =1.7 (Thermo) Ao sat = 98% PA sat = 67%, 69% Assessment: 1. Well-compensated volume status and outputs Plan/Discussion: Continue medical therapy. Suspect renal function was elevated due to overdiuresis. Glori Bickers, MD 11:59 AM    Medications:     Scheduled Medications: . amiodarone  200 mg Oral Q lunch  . carvedilol  12.5 mg Oral BID WC  . enoxaparin (LOVENOX) injection  40 mg Subcutaneous Q24H  . fluticasone furoate-vilanterol  1 puff Inhalation Daily  . ipratropium-albuterol  3 mL Nebulization BID  . ivabradine   7.5 mg Oral BID WC  . montelukast  10 mg Oral QHS  . multivitamin with minerals  1 tablet Oral QHS  . pantoprazole  40 mg Oral Daily  . senna-docusate  2 tablet Oral QHS  . sodium chloride flush  3 mL Intravenous Q12H  . Vitamin D (Ergocalciferol)  50,000 Units Oral Q7 days    Infusions:   PRN Medications: acetaminophen **OR** acetaminophen, albuterol, butalbital-acetaminophen-caffeine, colestipol, diclofenac sodium, dicyclomine, fluticasone, gabapentin, gabapentin, HYDROmorphone (DILAUDID) injection, lidocaine, ondansetron (ZOFRAN) IV, promethazine, tiZANidine      Assessment/Plan   1. Chest Pain, doubt ACS, ? pleuritic from cough - Had [facility] 2017 with normal coronaries.  - ? Related to cough - HS 4>5.  - CRP 0.7>0.9/Sed Rate 19  - Cox Sackie A/B pending.  - currently CP free   2. A/C Systolic HF, NICM peripartum  - Had [facility] 2017 with normal coronaries. CPX 04/2017  VO2 14 but corrects to 23 for IBW - Has [employer] ICD.  Most recent Heart Logic Score 31, impedance down, night time heart rate 104 . - Most recent ECHO 2020. ECHO with EF < 20%. RV ok. Will need to set up another CPX.  - RHC today as above. It appears she was overdiuresed. Back to baseline now. Output preserved - Can go home today on previous home regimen.   3. PVCs - Holter Monitor 11/09/17 with 30% PVCs with one primary morphology.  - S/p PVC ablation 11/2017 with Dr Lovena Le unsuccessful - Zio Patch 04/2019 > 17% PVCs and >8% couplets.   - Off flecainide with EF down and persistent PVCs. Changed to amio.  - Keep K > 4 and Mag >2  - Having frequent PVCs ~18/hr  - Continue amio. Will d/w EP regarding next stpes   4. DMII -Intolerant farxiga due to rash -on ssi  5. Obesity  - Body mass index is 34.56 kg/m. - Had gastic sleeve years ago.   6. AKI  - Creatinine up from 0.77>1.77>>1.81 - Improved to 1.2 today. - avoid overdiuresis.   Will arrange f/u in HF Clinic. AHF team will s/o today.     Length of Stay: 1  Glori Bickers, MD  11/16/2019, 12:02 PM  Advanced Heart Failure Team Pager [phone] (M-F; 7a - 4p)  Please contact [facility] for night-coverage after hours (4p -7a ) and weekends on [email or web address]

## 2019-11-16 NOTE — Progress Notes (Signed)
BP has been running low, patients BP was checked and it was a little lower, but not out of the ordinary for pt. 87/60 and 85/54. Patient asymptomatic, will recheck later and continue to monitor.

## 2019-11-16 NOTE — Progress Notes (Signed)
Assessed patients right heart cath dressing.  Dressing is clean, dry and intact.  Patient has no complaints at this time.  Will continue to monitor.

## 2019-11-16 NOTE — Progress Notes (Signed)
CARDIAC REHAB PHASE I   PRE:  Rate/Rhythm: 72 SR  BP:  Supine: 103/68  Sitting:   Standing:    SaO2: 100%RA 1453-1505 Came to see if pt wanted to walk. Pt stated she got pain med and was sleepy and would walk later. Has been walking to bathroom. BP taken at 103/68.          Graylon Good, RN BSN  11/16/2019 3:01 PM

## 2019-11-17 LAB — BASIC METABOLIC PANEL
Anion gap: 8 (ref 5–15)
BUN: 15 mg/dL (ref 6–20)
CO2: 27 mmol/L (ref 22–32)
Calcium: 9.3 mg/dL (ref 8.9–10.3)
Chloride: 99 mmol/L (ref 98–111)
Creatinine, Ser: 1.04 mg/dL — ABNORMAL HIGH (ref 0.44–1.00)
GFR calc Af Amer: >60 mL/min (ref >60)
GFR calc non Af Amer: >60 mL/min (ref >60)
Glucose, Bld: 89 mg/dL (ref 70–99)
Potassium: 4.9 mmol/L (ref 3.5–5.1)
Sodium: 134 mmol/L — ABNORMAL LOW (ref 135–145)

## 2019-11-17 LAB — COXSACKIE B VIRUS ANTIBODIES
Coxsackie B1 Ab: 1:8 {titer} — ABNORMAL HIGH
Coxsackie B2 Ab: NEGATIVE
Coxsackie B3 Ab: NEGATIVE
Coxsackie B4 Ab: NEGATIVE
Coxsackie B5 Ab: NEGATIVE
Coxsackie B6 Ab: 1:8 {titer} — ABNORMAL HIGH

## 2019-11-17 LAB — POCT I-STAT EG7
Acid-Base Excess: 0 mmol/L (ref 0.0–2.0)
Bicarbonate: 25.3 mmol/L (ref 20.0–28.0)
Calcium, Ion: 1.07 mmol/L — ABNORMAL LOW (ref 1.15–1.40)
HCT: 30 % — ABNORMAL LOW (ref 36.0–46.0)
Hemoglobin: 10.2 g/dL — ABNORMAL LOW (ref 12.0–15.0)
O2 Saturation: 67 %
Potassium: 3.8 mmol/L (ref 3.5–5.1)
Sodium: 141 mmol/L (ref 135–145)
TCO2: 27 mmol/L (ref 22–32)
pCO2, Ven: 43.8 mmHg — ABNORMAL LOW (ref 44.0–60.0)
pH, Ven: 7.37 (ref 7.250–7.430)
pO2, Ven: 36 mmHg (ref 32.0–45.0)

## 2019-11-17 MED ORDER — CARVEDILOL 12.5 MG PO TABS
12.5000 mg | ORAL_TABLET | Freq: Two times a day (BID) | ORAL | 0 refills | Status: DC
Start: 2019-11-17 — End: 2019-11-28

## 2019-11-17 MED ORDER — TORSEMIDE 20 MG PO TABS: 20.0000 mg | ORAL_TABLET | Freq: Every day | ORAL | 0 refills | Status: DC

## 2019-11-17 MED ORDER — IPRATROPIUM-ALBUTEROL 0.5-2.5 (3) MG/3ML IN SOLN: 3.0000 mL | Freq: Four times a day (QID) | RESPIRATORY_TRACT | 0 refills | Status: DC | PRN

## 2019-11-17 MED FILL — Heparin Sod (Porcine)-NaCl IV Soln 1000 Unit/500ML-0.9%: INTRAVENOUS | Qty: 500 | Status: AC

## 2019-11-17 NOTE — Progress Notes (Signed)
   Home today with HF follow up next week. I have set this up.   HF meds for d/c  -Amio 200 mg daily -Carvedilol 12.5 mg twice a day  -Ivabradine 7.5 mg twice a day  - Start torsemide 20 mg daily on 6/26  Calvina Liptak NP-C  11:30 AM

## 2019-11-17 NOTE — Discharge Summary (Addendum)
Physician Discharge Summary  [name] IRC:[id number] DOB: 06-29-1975 DOA: 11/12/2019  PCP: Megan Koch, MD  Admit date: 11/12/2019 Discharge date: 11/17/2019  Admitted From: Home Discharge disposition: Home   Code Status: Full Code  Diet Recommendation: Cardiac diet  Recommendations for Outpatient Follow-Up:   1. Follow-up with PCP as an outpatient 2. Follow-up with cardiology as an outpatient  Discharge Diagnosis:   Active Problems:   Essential hypertension   OSA (obstructive sleep apnea)   PVC (premature ventricular contraction)   NICM (nonischemic cardiomyopathy) (HCC)   Acute on chronic systolic (congestive) heart failure (HCC)   V-tach (HCC)  History of Present Illness / Brief narrative:  Patient is a 44 y.o.femalewith PMH significant for postpartum cardiomyopathy with systolic CHFwith normal cath x2 in the past, status post AICD, frequent PVCs status post unsuccessful ablation, asthma, IBS. Patient presented to ED on 6/20 with new onset chest pain.Symptoms started the previous night before she went to bed, sharp,persistent chest pain, localized, intensity8/10, worsened with lying down and deep breath,improved with sitting up. She woke up upearly in the morning of admission with persistent chest pain, associated with short of breath and presented to ED.   She also has had more palpitations than usual. She has had frequent PVCs for years had unsuccessful ablation couple years ago, and has been on beta-blocker and amiodarone since. She occasionally feels short of breath associated with exertion, and she takes torsemide twice daily.  In the meantime, her AICD monitors intraventricular dynamic and remotely alarmed cardiology for additional as needed torsemide, which she has been doing very well volume status wise. About 2-3weeks prior to presentation, patient was treated by PCPfor URI symptoms of congestion and productive cough. Symptoms resolved.   Monitoring and EKG shows frequent PVCs occasional degenerate into short runV-Tach. Troponin hip. X-ray negative for any acute infiltrates.  Patient was admitted under hospitalist service. Cardiology consultation obtained. Echocardiogram 6/22 with less than 20% EF, severe global hypokinesis. Coxsackievirus IgG antibodies positive, IgM negative.  Hospital Course:  Atypical chest pain -Pleural like chest pain, with history of recent URI,  -initially cardiology suspected pericarditis and started colchicine. -Pericarditis ruled out.  Colchicine was stopped. -Coxsackie A IgG positive, IgM negative.  Coxsackie B serology pending. -Chest pain resolved.  Acute exacerbation of systolic CHF Postpartum cardiomyopathy Nonsustained V. tach -Home meds include Coreg, amiodarone,  ivabradine, Entresto, Aldactone, torsemide. -She was diuresed with IV Lasix in the hospital.  Medication adjusted by cardiology. -Past history of [place] 2017 with normal coronaries -Underwent RHC on 11/16/19.  Well compensated volume status and outputs. -Has an ICD in place. Per cardiology, discharge meds: -Amio 200 mg daily -Carvedilol 12.5 mg twice a day -Ivabradine 7.5 mg twice a day - Start torsemide 20 mg daily on 6/26  DMII -Intolerant farxiga due to rash -on ssi  Asthma -No symptoms or signs of acute exacerbation  Migraine -Avoid NSAIDs  IBS -Continue home as needed regimen  Peripheral neuropathy -Continue gabapentin  Acute kidney injury Scr 0.7-> 1.81 > 1.2 -watch creatinine with diuresis  Morbid obesity s/p Gastric sleeve -No evidence of DM at this time. CBGs are normal. -Last A1C was 5.3  Hypokalemia -Secondary to IV diuresis.  Replacement given.    Body mass index is 34.83 kg/m. Mobility: Encourage ambulation Code Status:  Code Status: Full Code   Subjective:  Seen and examined this morning.  Pleasant middle-aged African-American female.  Not in distress.  Discharge Exam:    Vitals:   11/17/19 0740  11/17/19 0758 11/17/19 0802 11/17/19 1155  BP: 98/63   (!) 112/52  Pulse:    69  Resp:    18  Temp: 98.3 F (36.8 C)   98.4 F (36.9 C)  TempSrc: Oral   Oral  SpO2:  100% 100% 98%  Weight:      Height:        Body mass index is 34.93 kg/m.  General exam: Appears calm and comfortable.  Skin: No rashes, lesions or ulcers. HEENT: Atraumatic, normocephalic, supple neck, no obvious bleeding Lungs: Clear to auscultation bilaterally CVS: Regular rate and rhythm, no murmur GI/Abd soft, nontender, nondistended, bowel present CNS: Alert, awake, oriented x3 Psychiatry: Mood appropriate Extremities: No pedal edema, no calf tenderness  Discharge Instructions:  Wound care: none     Discharge Instructions    Diet - low sodium heart healthy   Complete by: As directed    Increase activity slowly   Complete by: As directed       Follow-up Information    [facility] Follow up on 11/23/2019.   Specialty: Cardiology Why: 2:00  Contact information: [address] [id number] mc [place]       Megan Koch, MD. Go on 12/01/2019.   Specialty: Internal Medicine Why: @9 :00am Contact information: [place] [address] [phone]              Allergies as of 11/17/2019      Reactions   Vancomycin Other (See Comments)   "did something to my kidneys," PROGRESSED TO KIDNEY FAILURE!!   Aspirin Other (See Comments)   Wheezing, (Pt states that she just wheezes some when she takes aspirin by itself but she can take aspirin in a combination product).   Contrast Media [iodinated Diagnostic Agents] Other (See Comments)   Multiple CT contrast studies done over 2 weeks caused ARF   Avocado Itching, Swelling   Ciprofloxacin Itching, Rash   Farxiga [dapagliflozin] Rash   Sulfa Antibiotics Itching, Rash      Medication List    STOP taking these  medications   Farxiga 10 MG Tabs tablet Generic drug: dapagliflozin propanediol   Ibuprofen-Famotidine 800-26.6 MG Tabs   linaclotide 145 MCG Caps capsule Commonly known as: Linzess   spironolactone 50 MG tablet Commonly known as: ALDACTONE     TAKE these medications   albuterol 108 (90 Base) MCG/ACT inhaler Commonly known as: ProAir HFA Inhale 1-2 puffs into the lungs every 6 (six) hours as needed for wheezing or shortness of breath. What changed: Another medication with the same name was removed. Continue taking this medication, and follow the directions you see here.   amiodarone 200 MG tablet Commonly known as: PACERONE Take 1 tablet by mouth once daily What changed: when to take this   butalbital-acetaminophen-caffeine 50-325-40 MG tablet Commonly known as: FIORICET TAKE 1 TO 2 TABLETS BY MOUTH EVERY 6 HOURS AS NEEDED FOR HEADACHE What changed: See the new instructions.   carvedilol 12.5 MG tablet Commonly known as: Coreg Take 1 tablet (12.5 mg total) by mouth 2 (two) times daily. What changed:   medication strength  how much to take   colestipol 1 g tablet Commonly known as: COLESTID Take 2 tablets (2 g total) by mouth 2 (two) times daily. What changed:   when to take this  reasons to take this   diclofenac sodium 1 % Gel Commonly known as: VOLTAREN Apply 4 g topically 4 (  four) times daily. What changed:   when to take this  reasons to take this   dicyclomine 20 MG tablet Commonly known as: BENTYL Take 20 mg by mouth daily as needed for spasms.   fluticasone 50 MCG/ACT nasal spray Commonly known as: FLONASE Place 2 sprays into both nostrils daily as needed for allergies.   gabapentin 100 MG capsule Commonly known as: NEURONTIN Take 100 mg by mouth 2 (two) times daily as needed (back spasms during the day).   gabapentin 300 MG capsule Commonly known as: NEURONTIN Take 300 mg by mouth at bedtime as needed (back spasms).     ipratropium-albuterol 0.5-2.5 (3) MG/3ML Soln Commonly known as: DUONEB Take 3 mLs by nebulization every 6 (six) hours as needed.   ivabradine 7.5 MG Tabs tablet Commonly known as: Corlanor TAKE 1 TABLET (7.5 MG TOTAL) BY MOUTH 2 (TWO) TIMES DAILY WITH A MEAL. What changed:   how much to take  how to take this  when to take this  additional instructions   lidocaine 5 % Commonly known as: Lidoderm Place 1 patch onto the skin daily. Remove & Discard patch within 12 hours or as directed by MD What changed:   when to take this  reasons to take this   montelukast 10 MG tablet Commonly known as: SINGULAIR Take 1 tablet (10 mg total) by mouth at bedtime.   MULTIVITAMIN GUMMIES WOMENS PO Take 2 tablets by mouth at bedtime.   nitroGLYCERIN 0.4 MG SL tablet Commonly known as: NITROSTAT Place 1 tablet (0.4 mg total) under the tongue every 5 (five) minutes as needed for chest pain.   omeprazole 20 MG capsule Commonly known as: PRILOSEC Take 20 mg by mouth daily as needed (for reflux symptoms).   potassium chloride 10 MEQ tablet Commonly known as: KLOR-CON Take 20 mEq by mouth 2 (two) times daily.   sacubitril-valsartan 97-103 MG Commonly known as: ENTRESTO Take 1 tablet by mouth 2 (two) times daily.   Symbicort 160-4.5 MCG/ACT inhaler Generic drug: budesonide-formoterol INHALE 2 PUFFS INTO THE LUNGS 2 (TWO) TIMES DAILY AS NEEDED. What changed: See the new instructions.   tiZANidine 4 MG tablet Commonly known as: ZANAFLEX Take 4 mg by mouth every 8 (eight) hours as needed for muscle spasms.   torsemide 20 MG tablet Commonly known as: DEMADEX Take 1 tablet (20 mg total) by mouth daily. Start taking on: November 18, 2019 What changed:   how much to take  when to take this   Ubrelvy 50 MG Tabs Generic drug: Ubrogepant Take 50 mg by mouth every 2 (two) hours as needed (migraine). What changed:   when to take this  additional instructions   Vitamin D  (Ergocalciferol) 1.25 MG (50000 UNIT) Caps capsule Commonly known as: DRISDOL Take 1 capsule (50,000 Units total) by mouth every 7 (seven) days. What changed: when to take this       Time coordinating discharge: 35 minutes  The results of significant diagnostics from this hospitalization (including imaging, microbiology, ancillary and laboratory) are listed below for reference.    Procedures and Diagnostic Studies:   DG Chest 2 View  Result Date: 11/12/2019 CLINICAL DATA:  Chest pain and cough. EXAM: CHEST - 2 VIEW COMPARISON:  September 07, 2019 FINDINGS: Stable AICD device. The heart, hila, and mediastinum are normal. No pneumothorax. No nodules or masses. No focal infiltrates. IMPRESSION: No active cardiopulmonary disease. Electronically Signed   By: Dorise Bullion III M.D   On: 11/12/2019 13:31  ECHOCARDIOGRAM COMPLETE  Result Date: 11/13/2019    ECHOCARDIOGRAM REPORT   Patient Name:   [name] Date of Exam: 11/13/2019 Medical Rec #:  [id number]      Height:       66.0 in Accession #:    [id number]     Weight:       212.8 lb Date of Birth:  Nov 01, 1975      BSA:          2.053 m Patient Age:    70 years       BP:           107/77 mmHg Patient Gender: F              HR:           88 bpm. Exam Location:  Inpatient Procedure: 2D Echo, Cardiac Doppler and Color Doppler Indications:    CHF-Acute Systolic  History:        Patient has prior history of Echocardiogram examinations, most                 recent 11/01/2018. CHF, Defibrillator; Risk Factors:Hypertension,                 Sleep Apnea and Diabetes.  Sonographer:    Clayton Lefort RDCS (AE) Referring Phys: [id number] [place]  1. Severe global reduction in LV systolic function; mild LVE; moderate LAE.  2. Left ventricular ejection fraction, by estimation, is <20%. The left ventricle has severely decreased function. The left ventricle demonstrates global hypokinesis. The left ventricular internal cavity size was mildly dilated.  Left ventricular diastolic parameters are indeterminate.  3. Right ventricular systolic function is normal. The right ventricular size is normal. There is normal pulmonary artery systolic pressure.  4. Left atrial size was moderately dilated.  5. The mitral valve is normal in structure. Trivial mitral valve regurgitation. No evidence of mitral stenosis.  6. The aortic valve is tricuspid. Aortic valve regurgitation is not visualized. No aortic stenosis is present.  7. The inferior vena cava is normal in size with greater than 50% respiratory variability, suggesting right atrial pressure of 3 mmHg. FINDINGS  Left Ventricle: Left ventricular ejection fraction, by estimation, is <20%. The left ventricle has severely decreased function. The left ventricle demonstrates global hypokinesis. The left ventricular internal cavity size was mildly dilated. There is no  left ventricular hypertrophy. Left ventricular diastolic parameters are indeterminate. Right Ventricle: The right ventricular size is normal.Right ventricular systolic function is normal. There is normal pulmonary artery systolic pressure. The tricuspid regurgitant velocity is 2.06 m/s, and with an assumed right atrial pressure of 3 mmHg, the estimated right ventricular systolic pressure is 19.3 mmHg. Left Atrium: Left atrial size was moderately dilated. Right Atrium: Right atrial size was normal in size. Pericardium: There is no evidence of pericardial effusion. Mitral Valve: The mitral valve is normal in structure. Normal mobility of the mitral valve leaflets. Trivial mitral valve regurgitation. No evidence of mitral valve stenosis. Tricuspid Valve: The tricuspid valve is normal in structure. Tricuspid valve regurgitation is mild . No evidence of tricuspid stenosis. Aortic Valve: The aortic valve is tricuspid. Aortic valve regurgitation is not visualized. No aortic stenosis is present. Aortic valve mean gradient measures 3.0 mmHg. Aortic valve peak gradient  measures 4.0 mmHg. Aortic valve area, by VTI measures 2.58 cm. Pulmonic Valve: The pulmonic valve was normal in structure. Pulmonic valve regurgitation is not visualized. No evidence of pulmonic stenosis. Aorta: The aortic  root is normal in size and structure. Venous: The inferior vena cava is normal in size with greater than 50% respiratory variability, suggesting right atrial pressure of 3 mmHg. IAS/Shunts: No atrial level shunt detected by color flow Doppler. Additional Comments: Severe global reduction in LV systolic function; mild LVE; moderate LAE. A pacer wire is visualized.  LEFT VENTRICLE PLAX 2D LVIDd:         5.80 cm LVIDs:         5.10 cm LV PW:         1.10 cm LV IVS:        1.00 cm LVOT diam:     2.30 cm LV SV:         54 LV SV Index:   26 LVOT Area:     4.15 cm  LV Volumes (MOD) LV vol d, MOD A2C: 133.0 ml LV vol d, MOD A4C: 171.0 ml LV vol s, MOD A2C: 91.5 ml LV vol s, MOD A4C: 119.0 ml LV SV MOD A2C:     41.5 ml LV SV MOD A4C:     171.0 ml LV SV MOD BP:      46.4 ml RIGHT VENTRICLE RV Basal diam:  2.10 cm RV S prime:     6.98 cm/s TAPSE (M-mode): 1.2 cm LEFT ATRIUM             Index       RIGHT ATRIUM           Index LA diam:        2.90 cm 1.41 cm/m  RA Area:     10.40 cm LA Vol (A2C):   81.3 ml 39.60 ml/m RA Volume:   17.80 ml  8.67 ml/m LA Vol (A4C):   84.1 ml 40.96 ml/m LA Biplane Vol: 84.7 ml 41.25 ml/m  AORTIC VALVE AV Area (Vmax):    2.98 cm AV Area (Vmean):   2.59 cm AV Area (VTI):     2.58 cm AV Vmax:           99.80 cm/s AV Vmean:          79.300 cm/s AV VTI:            0.209 m AV Peak Grad:      4.0 mmHg AV Mean Grad:      3.0 mmHg LVOT Vmax:         71.70 cm/s LVOT Vmean:        49.400 cm/s LVOT VTI:          0.130 m LVOT/AV VTI ratio: 0.62  AORTA Ao Root diam: 3.60 cm Ao Asc diam:  3.20 cm TRICUSPID VALVE TR Peak grad:   17.0 mmHg TR Vmax:        206.00 cm/s  SHUNTS Systemic VTI:  0.13 m Systemic Diam: 2.30 cm Kirk Ruths MD Electronically signed by Kirk Ruths MD  Signature Date/Time: 11/13/2019/5:01:58 PM    Final      Labs:   Basic Metabolic Panel: Recent Labs  Lab 11/13/19 0131 11/13/19 0131 11/14/19 4163 11/14/19 8453 11/15/19 0335 11/15/19 0335 11/16/19 0436 11/16/19 0436 11/16/19 1123 11/16/19 2015 11/17/19 0607  NA 134*   < > 136  --  135  --  135  --  137  --  134*  K 3.2*   < > 4.0   < > 4.0   < > 4.4   < > 4.3  --  4.9  CL 98  --  100  --  99  --  101  --   --   --  99  CO2 26  --  25  --  26  --  26  --   --   --  27  GLUCOSE 128*  --  98  --  87  --  97  --   --   --  89  BUN 6  --  20  --  28*  --  21*  --   --   --  15  CREATININE 0.81   < > 1.77*  --  1.81*  --  1.20*  --   --  1.15* 1.04*  CALCIUM 9.2  --  9.3  --  9.0  --  9.1  --   --   --  9.3  MG 1.7  1.7  --  2.3  --   --   --   --   --   --   --   --   PHOS 3.5  --   --   --   --   --   --   --   --   --   --    < > = values in this interval not displayed.   GFR Estimated Creatinine Clearance: 80.8 mL/min (A) (by C-G formula based on SCr of 1.04 mg/dL (H)). Liver Function Tests: No results for input(s): AST, ALT, ALKPHOS, BILITOT, PROT, ALBUMIN in the last 168 hours. No results for input(s): LIPASE, AMYLASE in the last 168 hours. No results for input(s): AMMONIA in the last 168 hours. Coagulation profile No results for input(s): INR, PROTIME in the last 168 hours.  CBC: Recent Labs  Lab 11/12/19 1249 11/16/19 1123 11/16/19 2015  WBC 7.6  --  6.3  HGB 9.6* 11.2* 10.4*  HCT 32.1* 33.0* 35.7*  MCV 79.3*  --  79.2*  PLT 212  --  249   Cardiac Enzymes: No results for input(s): CKTOTAL, CKMB, CKMBINDEX, TROPONINI in the last 168 hours. BNP: Invalid input(s): POCBNP CBG: No results for input(s): GLUCAP in the last 168 hours. D-Dimer No results for input(s): DDIMER in the last 72 hours. Hgb A1c No results for input(s): HGBA1C in the last 72 hours. Lipid Profile No results for input(s): CHOL, HDL, LDLCALC, TRIG, CHOLHDL, LDLDIRECT in the last 72  hours. Thyroid function studies No results for input(s): TSH, T4TOTAL, T3FREE, THYROIDAB in the last 72 hours.  Invalid input(s): FREET3 Anemia work up No results for input(s): VITAMINB12, FOLATE, FERRITIN, TIBC, IRON, RETICCTPCT in the last 72 hours. Microbiology Recent Results (from the past 240 hour(s))  SARS Coronavirus 2 by RT PCR (hospital order, performed in [facility] hospital lab) Nasopharyngeal Nasopharyngeal Swab     Status: None   Collection Time: 11/12/19  6:12 PM   Specimen: Nasopharyngeal Swab  Result Value Ref Range Status   SARS Coronavirus 2 NEGATIVE NEGATIVE Final    Comment: (NOTE) SARS-CoV-2 target nucleic acids are NOT DETECTED.  The SARS-CoV-2 RNA is generally detectable in upper and lower respiratory specimens during the acute phase of infection. The lowest concentration of SARS-CoV-2 viral copies this assay can detect is 250 copies / mL. A negative result does not preclude SARS-CoV-2 infection and should not be used as the sole basis for treatment or other patient management decisions.  A negative result may occur with improper specimen collection / handling, submission of specimen other than nasopharyngeal swab, presence of viral mutation(s) within the areas targeted by this  assay, and inadequate number of viral copies (<250 copies / mL). A negative result must be combined with clinical observations, patient history, and epidemiological information.  Fact Sheet for Patients:   StrictlyIdeas.no  Fact Sheet for Healthcare Providers: BankingDealers.co.za  This test is not yet approved or  cleared by the [place] FDA and has been authorized for detection and/or diagnosis of SARS-CoV-2 by FDA under an Emergency Use Authorization (EUA).  This EUA will remain in effect (meaning this test can be used) for the duration of the COVID-19 declaration under Section 564(b)(1) of the Act, 21 U.S.C. section  360bbb-3(b)(1), unless the authorization is terminated or revoked sooner.  Performed at [facility], [place] [address], [place], [place] [address]     Please note: You were cared for by a hospitalist during your hospital stay. Once you are discharged, your primary care physician will handle any further medical issues. Please note that NO REFILLS for any discharge medications will be authorized once you are discharged, as it is imperative that you return to your primary care physician (or establish a relationship with a primary care physician if you do not have one) for your post hospital discharge needs so that they can reassess your need for medications and monitor your lab values.  Signed: Terrilee Croak  [facility] 11/17/2019, 12:09 PM

## 2019-11-20 ENCOUNTER — Ambulatory Visit (INDEPENDENT_AMBULATORY_CARE_PROVIDER_SITE_OTHER): Payer: HMO

## 2019-11-20 ENCOUNTER — Other Ambulatory Visit: Payer: Self-pay

## 2019-11-20 DIAGNOSIS — Z95810 Presence of automatic (implantable) cardiac defibrillator: Secondary | ICD-10-CM

## 2019-11-20 DIAGNOSIS — I5042 Chronic combined systolic (congestive) and diastolic (congestive) heart failure: Secondary | ICD-10-CM

## 2019-11-20 NOTE — Patient Outreach (Signed)
  [facility]) Care Management Chronic Special Needs Program  11/20/2019  Name: [name] DOB: 1975/09/05  MRN: [id number]  [name] is enrolled in a chronic special needs plan for Heart Failure. Client presented to the hospital on 11/12/19 with new onset chest pain,  acute on chronic heart failure, V-tach.  Heart catheterization on 11/16/2019. Discharged on 11/17/2019 to home.   Goals Addressed            This Visit's Progress   . General - Client will not be readmitted within 30 days (C-SNP)-discharge date 11/17/2019   On track    Please follow discharge instructions and call provider if you have any questions. Please attend all follow up appointments as scheduled. Please take your medications as prescribed. Please call 24 hour nurse advice line as needed. [phone]).       Client will received transition of care call per [facility] solutions. RNCM will address red flags as indicated. Care plan updated.  Plan: RNCM will send updated care plan to client, send updated care plan to primary care. RNCM will outreach within the next 1-2 months to assess if any additional care management needs.  Thea Silversmith, RN, MSN, [place] [phone]

## 2019-11-20 NOTE — Progress Notes (Signed)
EPIC Encounter for ICM Monitoring  Patient Name: [name] is a 44 y.o. female Date: 11/20/2019 Primary Care Physican: Hoyt Koch, MD Primary Cardiologist:Bensimhon Electrophysiologist: Caryl Comes 10/18/2019 Weight: 214 lbs  Transmission reviewed.   06/24/2021HeartlogicHFIndex0 suggesting return to normal following hospital discharge on 11/17/2019.   Prescribed:  Torsemide20 mgTake 1 tablets  (20 mg total) by mouth daily.  Potassium 20 mEq take2tablets(40 mEq total)twice a day.  Labs: 11/17/2019 Creatinine 1.04, BUN 15, Potassium 4.9, Sodium 134, GFR >60 11/16/2019 Creatinine 1.20, BUN 21, Potassium 4.4, Sodium 135, GFR 55->60  11/15/2019 Creatinine 1.81, BUN 28, Potassium 4.0, Sodium 135, GFR 33-38  11/14/2019 Creatinine 1.77, BUN 20, Potassium 4.0, Sodium 136, GFR 34-40  11/13/2019 Creatinine 0.81, BUN 6,   Potassium 3.2, Sodium 134, GFR >60  11/12/2019 Creatinine 0.77, BUN 8,   Potassium 3.5, Sodium 138, GFR >60  09/07/2019 Creatinine 0.74, BUN 9,   Potassium 4.2, Sodium 142, GFR 98-113  A complete set of results can be found in Results Review.  Recommendations:  None  Follow-up plan: ICM clinic phone appointment on7/19/2021. 91 day device clinic remote transmission7/27/2021.   Next office visit 11/23/2019 with HF clinic NP/PA.     Copy of ICM check sent to Dr. Caryl Comes.  3 Month Trend    8 Day Data Trend         [name], RN 11/20/2019 5:07 PM

## 2019-11-22 ENCOUNTER — Encounter (INDEPENDENT_AMBULATORY_CARE_PROVIDER_SITE_OTHER): Payer: Self-pay

## 2019-11-23 ENCOUNTER — Encounter (HOSPITAL_COMMUNITY): Payer: Self-pay

## 2019-11-23 ENCOUNTER — Other Ambulatory Visit: Payer: Self-pay

## 2019-11-23 ENCOUNTER — Ambulatory Visit (HOSPITAL_COMMUNITY)
Admit: 2019-11-23 | Discharge: 2019-11-23 | Disposition: A | Discharge status: Home / Self Care | Payer: HMO | Attending: Internal Medicine | Admitting: Internal Medicine

## 2019-11-23 ENCOUNTER — Ambulatory Visit (HOSPITAL_COMMUNITY)
Admission: RE | Admit: 2019-11-23 | Discharge: 2019-11-23 | Disposition: A | Discharge status: Home / Self Care | Payer: HMO | Source: Ambulatory Visit | Attending: Adult Health | Admitting: Adult Health

## 2019-11-23 ENCOUNTER — Telehealth (HOSPITAL_COMMUNITY): Payer: Self-pay

## 2019-11-23 VITALS — BP 122/70 | HR 90 | Ht 66.0 in | Wt 220.0 lb

## 2019-11-23 DIAGNOSIS — Z791 Long term (current) use of non-steroidal anti-inflammatories (NSAID): Secondary | ICD-10-CM | POA: Diagnosis not present

## 2019-11-23 DIAGNOSIS — E119 Type 2 diabetes mellitus without complications: Secondary | ICD-10-CM | POA: Insufficient documentation

## 2019-11-23 DIAGNOSIS — J45909 Unspecified asthma, uncomplicated: Secondary | ICD-10-CM | POA: Insufficient documentation

## 2019-11-23 DIAGNOSIS — F329 Major depressive disorder, single episode, unspecified: Secondary | ICD-10-CM | POA: Diagnosis not present

## 2019-11-23 DIAGNOSIS — Z9851 Tubal ligation status: Secondary | ICD-10-CM | POA: Diagnosis not present

## 2019-11-23 DIAGNOSIS — R5383 Other fatigue: Secondary | ICD-10-CM

## 2019-11-23 DIAGNOSIS — Z7951 Long term (current) use of inhaled steroids: Secondary | ICD-10-CM | POA: Insufficient documentation

## 2019-11-23 DIAGNOSIS — M1990 Unspecified osteoarthritis, unspecified site: Secondary | ICD-10-CM | POA: Diagnosis not present

## 2019-11-23 DIAGNOSIS — I5042 Chronic combined systolic (congestive) and diastolic (congestive) heart failure: Secondary | ICD-10-CM

## 2019-11-23 DIAGNOSIS — Z6835 Body mass index (BMI) 35.0-35.9, adult: Secondary | ICD-10-CM | POA: Diagnosis not present

## 2019-11-23 DIAGNOSIS — Z9884 Bariatric surgery status: Secondary | ICD-10-CM | POA: Diagnosis not present

## 2019-11-23 DIAGNOSIS — Z79899 Other long term (current) drug therapy: Secondary | ICD-10-CM | POA: Insufficient documentation

## 2019-11-23 DIAGNOSIS — K589 Irritable bowel syndrome without diarrhea: Secondary | ICD-10-CM | POA: Insufficient documentation

## 2019-11-23 DIAGNOSIS — F419 Anxiety disorder, unspecified: Secondary | ICD-10-CM | POA: Insufficient documentation

## 2019-11-23 DIAGNOSIS — I428 Other cardiomyopathies: Secondary | ICD-10-CM | POA: Diagnosis not present

## 2019-11-23 DIAGNOSIS — G479 Sleep disorder, unspecified: Secondary | ICD-10-CM | POA: Diagnosis not present

## 2019-11-23 DIAGNOSIS — O903 Peripartum cardiomyopathy: Secondary | ICD-10-CM | POA: Diagnosis not present

## 2019-11-23 DIAGNOSIS — G4733 Obstructive sleep apnea (adult) (pediatric): Secondary | ICD-10-CM

## 2019-11-23 DIAGNOSIS — Z95810 Presence of automatic (implantable) cardiac defibrillator: Secondary | ICD-10-CM | POA: Insufficient documentation

## 2019-11-23 DIAGNOSIS — Z8249 Family history of ischemic heart disease and other diseases of the circulatory system: Secondary | ICD-10-CM | POA: Insufficient documentation

## 2019-11-23 DIAGNOSIS — I493 Ventricular premature depolarization: Secondary | ICD-10-CM | POA: Diagnosis not present

## 2019-11-23 DIAGNOSIS — Z881 Allergy status to other antibiotic agents status: Secondary | ICD-10-CM | POA: Diagnosis not present

## 2019-11-23 DIAGNOSIS — I110 Hypertensive heart disease with heart failure: Secondary | ICD-10-CM | POA: Diagnosis not present

## 2019-11-23 DIAGNOSIS — Z886 Allergy status to analgesic agent status: Secondary | ICD-10-CM | POA: Insufficient documentation

## 2019-11-23 DIAGNOSIS — Z882 Allergy status to sulfonamides status: Secondary | ICD-10-CM | POA: Insufficient documentation

## 2019-11-23 LAB — BASIC METABOLIC PANEL
Anion gap: 7 (ref 5–15)
BUN: 10 mg/dL (ref 6–20)
CO2: 26 mmol/L (ref 22–32)
Calcium: 9 mg/dL (ref 8.9–10.3)
Chloride: 105 mmol/L (ref 98–111)
Creatinine, Ser: 0.84 mg/dL (ref 0.44–1.00)
GFR calc Af Amer: >60 mL/min (ref >60)
GFR calc non Af Amer: >60 mL/min (ref >60)
Glucose, Bld: 99 mg/dL (ref 70–99)
Potassium: 4.1 mmol/L (ref 3.5–5.1)
Sodium: 138 mmol/L (ref 135–145)

## 2019-11-23 LAB — MAGNESIUM: Magnesium: 1.9 mg/dL (ref 1.7–2.4)

## 2019-11-23 MED ORDER — MAGNESIUM OXIDE -MG SUPPLEMENT 200 MG PO TABS: 200.0000 mg | ORAL_TABLET | Freq: Every day | ORAL | 3 refills | Status: DC

## 2019-11-23 NOTE — Patient Instructions (Addendum)
Routine lab work today. Will notify you of abnormal results  Your provider requests you wear a 3day zio patch  Your provider requests you have a home sleep study  (the company will contact you from a toll free phone number to arrange)   Your provider requests you have a CPX.  Follow up with Dr.Taylor in 6-8 weeks  Follow up in 8 weeks.

## 2019-11-23 NOTE — Progress Notes (Signed)
Patient Name: [name]          DOB: 20-Feb-1976      Height: 5'6    Weight: 220lbs  Office Name: [facility]         Referring Provider:   Today's Date:11/23/2019  STOP BANG RISK ASSESSMENT S (snore) Have you been told that you snore?     YES   T (tired) Are you often tired, fatigued, or sleepy during the day?   YES  O (obstruction) Do you stop breathing, choke, or gasp during sleep? YES   P (pressure) Do you have or are you being treated for high blood pressure? YES   B (BMI) Is your body index greater than 35 kg/m? YES   A (age) Are you 15 years old or older? NO   N (neck) Do you have a neck circumference greater than 16 inches?   NO   G (gender) Are you a female? NO   TOTAL STOP/BANG "YES" ANSWERS 5                                                                       For Office Use Only              Procedure Order Form    YES to 3+ Stop Bang questions OR two clinical symptoms - patient qualifies for WatchPAT (CPT 95800)     Submit: This Form + Patient Face Sheet + Clinical Note via CloudPAT or Fax: [phone]         Clinical Notes: Will consult Sleep Specialist and refer for management of therapy due to patient increased risk of Sleep Apnea. Ordering a sleep study due to the following two clinical symptoms: Excessive daytime sleepiness G47.10 / Gastroesophageal reflux K21.9 / Nocturia R35.1 / Morning Headaches G44.221 / Difficulty concentrating R41.840 / Memory problems or poor judgment G31.84 / Personality changes or irritability R45.4 / Loud snoring R06.83 / Depression F32.9 / Unrefreshed by sleep G47.8 / Impotence N52.9 / History of high blood pressure R03.0 / Insomnia G47.00    I understand that I am proceeding with a home sleep apnea test as ordered by my treating physician. I understand that untreated sleep apnea is a serious cardiovascular risk factor and it is my responsibility to perform the test and seek management for sleep apnea. I will be  contacted with the results and be managed for sleep apnea by a local sleep physician. I will be receiving equipment and further instructions from [facility]. I shall promptly ship back the equipment via the included mailing label. I understand my insurance will be billed for the test and as the patient I am responsible for any insurance related out-of-pocket costs incurred. I have been provided with written instructions and can call for additional video or telephonic instruction, with 24-hour availability of qualified personnel to answer any questions: Patient Help Desk [phone].  Patient Signature ______________________________________________________   Date______________________ Patient Telemedicine Verbal Consent

## 2019-11-23 NOTE — Progress Notes (Addendum)
Advanced Heart Failure Clinic Note Date:  11/23/2019  ID:  [name], DOB August 13, 1975, MRN [id number]  PCP:  Hoyt Koch, MD  Cardiologist: Pernell Dupre, MD HF: Dr Haroldine Laws EP: Dr. Andre Lefort [name] is a 44 y.o. 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   Was admitted 12/5-11/2016 with recurrent HF, URI, CP and elevated BP (160/110). CT chest no PE. Diuresed from 233-> 228 pounds.   Admitted 11/2017 with CP described as "pounding". Troponins were negative. Symptoms thought to be related to frequent PVCs. EP consulted and scheduled her for PVC ablation. Echo repeated 11/22/17 and showed improved EF 50-55%. HF team consulted and changed torsemide to 40 mg daily (from 3x/week).   Underwent PVC ablation 12/01/17. Was felt not to be successful.  Was on flecainide but discontinued due to reduced EF. Now on amiodarone.   Repeat Echo 10/2018 showed drop in EF back down to 25-30%. Was instructed to f/u in clinic but pt did not. She had outpatient monitor 04/2018 that showed rare PVCs.    She recently presented to the ED 11/18 w/ CC of chest pain. According to EDP documentation, pain had been present for 3-6 hrs prior to presenting to the ED. Described as central-left sided heaviness/pressure/tightness that was worse with exertion. No pleuritic pain or hypoxia to suggest acute PE. Hs Troponin normal x 2 (4>>5). EKG showed sinus tach 104 w/ frequent PVCs. Device interrogation was unremarkable. She was released from the ED and instructed to f/u in clinic.   Saw EP on 12/11 with suggestion for bb again.   Admitted 10/2019 with chest pain and shortness of breath. Viral panel negative. ECHO completed and showed Ef < 20% and  normal RV. Had cath with preserved cardiac output. Plan was to f/u with EP regarding PVCs.   Today she returns for HF follow up.Overall feeling better since discharge. Having ongoing palpitations. Says she is falling asleep easily during the day and has headaches associated with this. The last time she had headaches it was associated with sleep apnea. Mild SOB with steps every now and then. Denies PND/Orthopnea. Appetite ok. No fever or chills. Weight at home has been stable.   pounds. Taking all medications.  [employer] Score: 11/20/19 =0   Cardiac Studies  Echo done 05/19/2018 by Dr Haroldine Laws --. EF back down to 20-25% - Bedside echo by Dr. Haroldine Laws with EF improved to 50%. Echo 6/20 - EF back down at 25-30%.   Cath 6/17 revealed normal vessels, moderate elevation in pulmonary artery pressures, and LVEF 25-30%.  RHC 10/2019  RA = 4 RV = 32/6 PA = 31/8 (21) PCW = 12 Fick cardiac output/index = 7.0/3.4 Thermo CO/CI = 5.2/2.5 PVR =1.7 (Thermo) Ao sat = 98% PA sat = 67%, 69%  RHC 3/19: RA = 2 RV = 26/7 PA = 28/13 (19) PCW = 5 Fick cardiac output/index = 5.7/2.8 PVR = 2.5 WU Ao sat = 99% PA sat = 71%, 72% High SVC sat = 65%  RHC 6/17 RA 5 RV 35/6 PA 41/12 PW 11 Fick 6.4/3.0 PA sat 62%  Echo 6/15 35-40% Echo 6/17 25-30% Echo 12/17 20-25% Echo 8/18 EF 25% Echo 7/19: EF 50-55% Echo 6/20: EF 25-30% Echo 11/13/19:  Ef < 20% RV normal   CPX 2/18 FVC 2.57 (79%)    FEV1 2.14 (81%)     FEV1/FVC 83 (101%)     MVV 107 (102%) Resting HR: 106 Peak HR: 166  (93% age predicted max HR) BP rest: 136/98 BP peak: 174/88 Peak VO2: 17.1 (85% predicted peak VO2) - corrects to 28.4 for ibw VE/VCO2 slope: 33 OUES: 2.16 Peak RER: 1.09 Ventilatory Threshold: 14.6 (73% predicted or measured peak VO2) VE/MVV: 59% PETCO2 at peak: 33 O2pulse: 10  (83% predicted O2pulse)  CPX 12/18 pVO2 14.0 (corrects to 23.0 for IBW) VeVCO2  32 RER 1.0  Review of systems  complete and found to be negative unless listed in HPI.    Past Medical History:  Diagnosis Date  . Acute on chronic systolic (congestive) heart failure ([place]) 04/29/2017  . Acute pain of right shoulder 06/16/2017  . AICD (automatic cardioverter/defibrillator) present   . Anxiety   . Arthritis    right shoulder   . Asthma   . CHF (congestive heart failure) ([place])   . Chronic combined systolic and diastolic heart failure ([place]) 03/05/2014  . Closed low lateral malleolus fracture 10/23/2013  . Cystitis 10/21/2017  . Depression   . Depression with anxiety 01/20/2013  . Diabetes mellitus without complication ([place])   . Diverticulosis   . Dyspnea    comes and goes intermittently mostly with exertion   . Essential hypertension    Prev followed by [facility]/ Cardiology   . Fibroid    age 47  . Gallstones   . Generalized abdominal cramping   . History of cardiomyopathy   . Hypertension   . IBS (irritable bowel syndrome)   . 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 Insomnia 05/07/2017  . Labral tear of shoulder 04/04/2015    Injected 04/04/2015 Injected 12/03/2015   . Migraine    "monthly" (08/03/2016)  . Myofascial pain 06/16/2017  . NICM (nonischemic cardiomyopathy) ([place]) 08/03/2016  . Nonallopathic lesion of lumbosacral region 11/16/2016  . Nonallopathic lesion of sacral region 11/16/2016  . Nonallopathic lesion of thoracic region 08/20/2014  . Nonspecific chest pain 04/28/2017  . OSA (obstructive sleep apnea) 01/02/2013   NPSG 2009:  AHI 9/hr. CPAP intolerance >> "smothering" Good tolerance of auto device (optimal pressure 12-13 on download).  - referred to Dr Gwenette Greet    . OSA on CPAP   . Ovarian cyst    1999; surgically removed  . Patellofemoral syndrome of both knees 10/16/2016  . 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 PVC (premature ventricular contraction) 06/23/2016  . Seizures ([place])    "as a child" (08/03/2016)  . Termination of  pregnancy    due to cardiac risk    Past Surgical History:  Procedure Laterality Date  . CARDIAC CATHETERIZATION N/A 11/11/2015   Procedure: Right/Left Heart Cath and Coronary Angiography;  Surgeon: Troy Sine, MD;  Location: [place] [facility];  Service: Cardiovascular;  Laterality: N/A;  . CARDIAC CATHETERIZATION  ~ 2015  . CARDIAC DEFIBRILLATOR PLACEMENT  08/03/2016  . CESAREAN SECTION  1999  . COLONOSCOPY WITH PROPOFOL N/A 04/21/2016   Procedure: COLONOSCOPY WITH PROPOFOL;  Surgeon: Jerene Bears, MD;  Location: [facility];  Service: Gastroenterology;  Laterality: N/A;  . ESOPHAGOGASTRODUODENOSCOPY (EGD) WITH PROPOFOL N/A 04/21/2016   Procedure: ESOPHAGOGASTRODUODENOSCOPY (EGD) WITH PROPOFOL;  Surgeon: Jerene Bears, MD;  Location: [facility];  Service: Gastroenterology;  Laterality: N/A;  . FOOT FRACTURE SURGERY Right ~  2003  . FRACTURE SURGERY    . ICD IMPLANT N/A 08/03/2016   Procedure: ICD Implant;  Surgeon: Deboraha Sprang, MD;  Location: [place] [facility];  Service: Cardiovascular;  Laterality: N/A;  . LAPAROSCOPIC CHOLECYSTECTOMY  12/2006  . LAPAROSCOPIC GASTRIC SLEEVE RESECTION    . LAPAROSCOPY ABDOMEN DIAGNOSTIC  2008   "cut bile duct w/gallbladder OR; had to go in later & fix leak; hospitalized for 2 months"  . LEFT HEART CATHETERIZATION WITH CORONARY ANGIOGRAM N/A 02/26/2014   Procedure: LEFT HEART CATHETERIZATION WITH CORONARY ANGIOGRAM;  Surgeon: Jettie Booze, MD;  Location: [facility];  Service: Cardiovascular;  Laterality: N/A;  . OVARIAN CYST REMOVAL Right 1999  . PVC ABLATION N/A 12/01/2017   Procedure: PVC ABLATION;  Surgeon: Evans Lance, MD;  Location: [place] [facility];  Service: Cardiovascular;  Laterality: N/A;  . RIGHT HEART CATH N/A 08/05/2017   Procedure: RIGHT HEART CATH;  Surgeon: Jolaine Artist, MD;  Location: [place] [facility];  Service: Cardiovascular;  Laterality: N/A;  . RIGHT HEART CATH N/A 11/16/2019   Procedure: RIGHT HEART  CATH;  Surgeon: Jolaine Artist, MD;  Location: [place] [facility];  Service: Cardiovascular;  Laterality: N/A;  . TUBAL LIGATION  1999    Current Medications:  Current Meds  Medication Sig  . albuterol (PROAIR HFA) 108 (90 Base) MCG/ACT inhaler Inhale 1-2 puffs into the lungs every 6 (six) hours as needed for wheezing or shortness of breath.  Marland Kitchen amiodarone (PACERONE) 200 MG tablet Take 1 tablet by mouth once daily (Patient taking differently: Take 200 mg by mouth daily with lunch. )  . butalbital-acetaminophen-caffeine (FIORICET) 50-325-40 MG tablet TAKE 1 TO 2 TABLETS BY MOUTH EVERY 6 HOURS AS NEEDED FOR HEADACHE (Patient taking differently: Take 1-2 tablets by mouth every 6 (six) hours as needed for headache or migraine. )  . carvedilol (COREG) 12.5 MG tablet Take 1 tablet (12.5 mg total) by mouth 2 (two) times daily.  . colestipol (COLESTID) 1 g tablet Take 2 tablets (2 g total) by mouth 2 (two) times daily. (Patient taking differently: Take 2 g by mouth daily as needed (IBS). )  . diclofenac sodium (VOLTAREN) 1 % GEL Apply 4 g topically 4 (four) times daily. (Patient taking differently: Apply 4 g topically 4 (four) times daily as needed (pain). )  . dicyclomine (BENTYL) 20 MG tablet Take 20 mg by mouth daily as needed for spasms.   . fluticasone (FLONASE) 50 MCG/ACT nasal spray Place 2 sprays into both nostrils daily as needed for allergies.  Marland Kitchen gabapentin (NEURONTIN) 100 MG capsule Take 100 mg by mouth 2 (two) times daily as needed (back spasms during the day).   Marland Kitchen gabapentin (NEURONTIN) 300 MG capsule Take 300 mg by mouth at bedtime as needed (back spasms).  Marland Kitchen ipratropium-albuterol (DUONEB) 0.5-2.5 (3) MG/3ML SOLN Take 3 mLs by nebulization every 6 (six) hours as needed.  . ivabradine (CORLANOR) 7.5 MG TABS tablet TAKE 1 TABLET (7.5 MG TOTAL) BY MOUTH 2 (TWO) TIMES DAILY WITH A MEAL. (Patient taking differently: Take 7.5 mg by mouth daily with lunch. )  . lidocaine (LIDODERM) 5 % Place 1  patch onto the skin daily. Remove & Discard patch within 12 hours or as directed by MD (Patient taking differently: Place 1 patch onto the skin 2 (two) times daily as needed (pain). Remove & Discard patch within 12 hours or as directed by MD)  . montelukast (SINGULAIR) 10 MG tablet Take 1  tablet (10 mg total) by mouth at bedtime.  . Multiple Vitamins-Minerals (MULTIVITAMIN GUMMIES WOMENS PO) Take 2 tablets by mouth at bedtime.   . nitroGLYCERIN (NITROSTAT) 0.4 MG SL tablet Place 1 tablet (0.4 mg total) under the tongue every 5 (five) minutes as needed for chest pain.  Marland Kitchen omeprazole (PRILOSEC) 20 MG capsule Take 20 mg by mouth daily as needed (for reflux symptoms).   . potassium chloride (KLOR-CON) 10 MEQ tablet Take 20 mEq by mouth 2 (two) times daily.  . sacubitril-valsartan (ENTRESTO) 97-103 MG Take 1 tablet by mouth 2 (two) times daily.  . SYMBICORT 160-4.5 MCG/ACT inhaler INHALE 2 PUFFS INTO THE LUNGS 2 (TWO) TIMES DAILY AS NEEDED. (Patient taking differently: Inhale 2 puffs into the lungs 2 (two) times daily as needed (shortness of breath/wheezing). )  . tiZANidine (ZANAFLEX) 4 MG tablet Take 4 mg by mouth every 8 (eight) hours as needed for muscle spasms.   Marland Kitchen torsemide (DEMADEX) 20 MG tablet Take 1 tablet (20 mg total) by mouth daily.  Marland Kitchen Ubrogepant (UBRELVY) 50 MG TABS Take 50 mg by mouth every 2 (two) hours as needed (migraine). (Patient taking differently: Take 50 mg by mouth See admin instructions. Take one tablet (50 mg) by mouth at onset of migraine headache, may repeat in 30 minutes if still needed.)  . Vitamin D, Ergocalciferol, (DRISDOL) 1.25 MG (50000 UT) CAPS capsule Take 1 capsule (50,000 Units total) by mouth every 7 (seven) days. (Patient taking differently: Take 50,000 Units by mouth every Wednesday. )   Allergies:   Vancomycin, Aspirin, Contrast media [iodinated diagnostic agents], Avocado, Ciprofloxacin, Farxiga [dapagliflozin], and Sulfa antibiotics   Social History    Socioeconomic History  . Marital status: Married    Spouse name: Not on file  . Number of children: 1  . Years of education: Not on file  . Highest education level: Not on file  Occupational History  . Occupation: stay at home mom  Tobacco Use  . Smoking status: Never Smoker  . Smokeless tobacco: Never Used  Vaping Use  . Vaping Use: Never used  Substance and Sexual Activity  . Alcohol use: No  . Drug use: No  . Sexual activity: Yes  Other Topics Concern  . Not on file  Social History Narrative  . Not on file   Social Determinants of Health   Financial Resource Strain:   . Difficulty of Paying Living Expenses:   Food Insecurity: No Food Insecurity  . Worried About Charity fundraiser in the Last Year: Never true  . Ran Out of Food in the Last Year: Never true  Transportation Needs: No Transportation Needs  . Lack of Transportation (Medical): No  . Lack of Transportation (Non-Medical): No  Physical Activity:   . Days of Exercise per Week:   . Minutes of Exercise per Session:   Stress:   . Feeling of Stress :   Social Connections:   . Frequency of Communication with Friends and Family:   . Frequency of Social Gatherings with Friends and Family:   . Attends Religious Services:   . Active Member of Clubs or Organizations:   . Attends Archivist Meetings:   Marland Kitchen Marital Status:     Family History:  The patient's family history includes Allergies in her daughter; Emphysema in her maternal grandmother; Heart disease (age of onset: 75) in her maternal grandmother; Rheum arthritis in her mother.   Vitals:   11/23/19 1408  BP: 122/70  Pulse: 90  SpO2: 98%  Weight: 99.8 kg  Height: 5\' 6"  (1.676 m)    Wt Readings from Last 3 Encounters:  11/23/19 99.8 kg  11/17/19 98.2 kg  09/13/19 99.3 kg    Physical Exam General:  Walked in the clinic.  No resp difficulty HEENT: normal Neck: supple. no JVD. Carotids 2+ bilat; no bruits. No lymphadenopathy or  thryomegaly appreciated. Cor: PMI nondisplaced. Regular rate & rhythm. No rubs, gallops or murmurs. Lungs: clear Abdomen: soft, nontender, nondistended. No hepatosplenomegaly. No bruits or masses. Good bowel sounds. Extremities: no cyanosis, clubbing, rash, edema Neuro: alert & orientedx3, cranial nerves grossly intact. moves all 4 extremities w/o difficulty. Affect pleasant  Assessment and Plan 1. Chronic systolic HF:  due to peri-partum CM, onset 1999. S/P [employer] ICD 07/2016 - EF 20-25% (12/17).  - Echo 8/18 EF 25-30% s/p ICD 3/18. [place] 3/19: RA 2, PA 28/13, Fick CO 5.7, CI 2.8 - CPX 12/18 showed moderate limitation due mostly to obesity with some HF component - Echo 11/22/17 LVEF 50-55%, Grade 1 DD, Mild MR, Mod LAE - Echo 04/2018 by Dr Haroldine Laws EF20-25%. RV ok - Echo 10/2019 EF < 20%. - NYHA II. Volume status stable. Continue 20 mg torsemide daily.    - Continue Entresto 97-103 mg BID - Off spiro in the hospital. I will check BMET today.  - Continue corlanor 7.5mg  BID for now.  -No farixiga due to rash  - Set up CPX test.   2. Morbid obesity - s/p Gastric Sleeve at [facility] 09/20/17  - Body mass index is 35.51 kg/m.  - Asked her to start walking again.   3. OSA - Repeat sleep study in 2019 was negative for sleep apnea but now having day time fatigue again.  - Check home sleep study.   4. Anxiety/depression - Per PCP. No change.   5. DM2 - A1c in Jan. 2018 is 5.3 with medications.  - Per PCP.  - Intolerant farxiga due to rash  6. Frequent PVCs/ Bigeminy - Holter Monitor 11/09/17 with 30% PVCs with one primary morphology.  - S/p PVC ablation 11/2017 with Dr Lovena Le unsuccessful - Off flecainide with EF down and persistent PVCs. Changed to amio - Zio Patch 04/2019 > 17% PVCs and >8% couplets.   - Continue amio 200 mg daily + carvedilol 12.5 mg twice a day .  - Place zio patch to quantify PVCs.  -Set up f/u with Dr Lovena Le.  - needs annual eye exams   Follow up  in 8 weeks/12 weeks with Dr Haroldine Laws - Set up CPX and home sleep study - Check BMET and Chriss Czar, NP  11/23/2019 2:13 PM

## 2019-11-23 NOTE — Telephone Encounter (Signed)
Patient advised and verbalized understanding,agreeable to plan. Rx sent into patients pharmacy

## 2019-11-23 NOTE — Telephone Encounter (Signed)
-----   Message from Conrad Takilma, NP sent at 11/23/2019  3:58 PM EDT ----- Renal function stable. No change.   K stable. Mag 1.9. Add 200 mg mag oxide daily. Please call.

## 2019-11-24 ENCOUNTER — Telehealth (HOSPITAL_COMMUNITY): Payer: Self-pay | Admitting: *Deleted

## 2019-11-24 NOTE — Telephone Encounter (Signed)
Order, OV note, stop bang and demographics all faxed to [facility] at [phone]  

## 2019-11-27 ENCOUNTER — Observation Stay (HOSPITAL_COMMUNITY)
Admission: EM | Admit: 2019-11-27 | Discharge: 2019-11-28 | Disposition: A | Discharge status: Home / Self Care | Payer: HMO | Attending: Internal Medicine | Admitting: Internal Medicine

## 2019-11-27 ENCOUNTER — Other Ambulatory Visit: Payer: Self-pay

## 2019-11-27 ENCOUNTER — Encounter (HOSPITAL_COMMUNITY): Payer: Self-pay | Admitting: *Deleted

## 2019-11-27 ENCOUNTER — Emergency Department (HOSPITAL_COMMUNITY): Payer: HMO

## 2019-11-27 DIAGNOSIS — Z20822 Contact with and (suspected) exposure to covid-19: Secondary | ICD-10-CM | POA: Insufficient documentation

## 2019-11-27 DIAGNOSIS — J45909 Unspecified asthma, uncomplicated: Secondary | ICD-10-CM | POA: Diagnosis not present

## 2019-11-27 DIAGNOSIS — I110 Hypertensive heart disease with heart failure: Secondary | ICD-10-CM | POA: Diagnosis not present

## 2019-11-27 DIAGNOSIS — G4733 Obstructive sleep apnea (adult) (pediatric): Secondary | ICD-10-CM | POA: Diagnosis not present

## 2019-11-27 DIAGNOSIS — Z79899 Other long term (current) drug therapy: Secondary | ICD-10-CM | POA: Diagnosis not present

## 2019-11-27 DIAGNOSIS — M19011 Primary osteoarthritis, right shoulder: Secondary | ICD-10-CM | POA: Diagnosis not present

## 2019-11-27 DIAGNOSIS — Z888 Allergy status to other drugs, medicaments and biological substances status: Secondary | ICD-10-CM | POA: Insufficient documentation

## 2019-11-27 DIAGNOSIS — Z6833 Body mass index (BMI) 33.0-33.9, adult: Secondary | ICD-10-CM | POA: Insufficient documentation

## 2019-11-27 DIAGNOSIS — E119 Type 2 diabetes mellitus without complications: Secondary | ICD-10-CM | POA: Diagnosis not present

## 2019-11-27 DIAGNOSIS — I493 Ventricular premature depolarization: Secondary | ICD-10-CM | POA: Diagnosis not present

## 2019-11-27 DIAGNOSIS — G43909 Migraine, unspecified, not intractable, without status migrainosus: Secondary | ICD-10-CM | POA: Diagnosis not present

## 2019-11-27 DIAGNOSIS — I5043 Acute on chronic combined systolic (congestive) and diastolic (congestive) heart failure: Secondary | ICD-10-CM | POA: Diagnosis not present

## 2019-11-27 DIAGNOSIS — K589 Irritable bowel syndrome without diarrhea: Secondary | ICD-10-CM | POA: Insufficient documentation

## 2019-11-27 DIAGNOSIS — F418 Other specified anxiety disorders: Secondary | ICD-10-CM | POA: Insufficient documentation

## 2019-11-27 DIAGNOSIS — J90 Pleural effusion, not elsewhere classified: Secondary | ICD-10-CM | POA: Diagnosis not present

## 2019-11-27 DIAGNOSIS — O903 Peripartum cardiomyopathy: Secondary | ICD-10-CM | POA: Insufficient documentation

## 2019-11-27 DIAGNOSIS — R0602 Shortness of breath: Secondary | ICD-10-CM | POA: Diagnosis not present

## 2019-11-27 DIAGNOSIS — Z886 Allergy status to analgesic agent status: Secondary | ICD-10-CM | POA: Diagnosis not present

## 2019-11-27 DIAGNOSIS — Z9884 Bariatric surgery status: Secondary | ICD-10-CM | POA: Insufficient documentation

## 2019-11-27 DIAGNOSIS — I509 Heart failure, unspecified: Secondary | ICD-10-CM

## 2019-11-27 DIAGNOSIS — Z881 Allergy status to other antibiotic agents status: Secondary | ICD-10-CM | POA: Diagnosis not present

## 2019-11-27 DIAGNOSIS — Z95810 Presence of automatic (implantable) cardiac defibrillator: Secondary | ICD-10-CM | POA: Insufficient documentation

## 2019-11-27 DIAGNOSIS — I428 Other cardiomyopathies: Secondary | ICD-10-CM

## 2019-11-27 DIAGNOSIS — Z882 Allergy status to sulfonamides status: Secondary | ICD-10-CM | POA: Insufficient documentation

## 2019-11-27 DIAGNOSIS — F419 Anxiety disorder, unspecified: Secondary | ICD-10-CM | POA: Diagnosis not present

## 2019-11-27 LAB — CBC
HCT: 30.6 % — ABNORMAL LOW (ref 36.0–46.0)
HCT: 30.6 % — ABNORMAL LOW (ref 36.0–46.0)
Hemoglobin: 9 g/dL — ABNORMAL LOW (ref 12.0–15.0)
Hemoglobin: 9 g/dL — ABNORMAL LOW (ref 12.0–15.0)
MCH: 23.1 pg — ABNORMAL LOW (ref 26.0–34.0)
MCH: 23.1 pg — ABNORMAL LOW (ref 26.0–34.0)
MCHC: 29.4 g/dL — ABNORMAL LOW (ref 30.0–36.0)
MCHC: 29.4 g/dL — ABNORMAL LOW (ref 30.0–36.0)
MCV: 78.5 fL — ABNORMAL LOW (ref 80.0–100.0)
MCV: 78.5 fL — ABNORMAL LOW (ref 80.0–100.0)
Platelets: 252 10*3/uL (ref 150–400)
Platelets: 237 10*3/uL (ref 150–400)
RBC: 3.9 MIL/uL (ref 3.87–5.11)
RBC: 3.9 MIL/uL (ref 3.87–5.11)
RDW: 16.1 % — ABNORMAL HIGH (ref 11.5–15.5)
RDW: 16.3 % — ABNORMAL HIGH (ref 11.5–15.5)
WBC: 8.5 10*3/uL (ref 4.0–10.5)
WBC: 8.6 10*3/uL (ref 4.0–10.5)
nRBC: 0 % (ref 0.0–0.2)
nRBC: 0 % (ref 0.0–0.2)

## 2019-11-27 LAB — SARS CORONAVIRUS 2 BY RT PCR (HOSPITAL ORDER, PERFORMED IN ~~LOC~~ HOSPITAL LAB): SARS Coronavirus 2: NEGATIVE

## 2019-11-27 LAB — BASIC METABOLIC PANEL
Anion gap: 9 (ref 5–15)
BUN: 9 mg/dL (ref 6–20)
CO2: 21 mmol/L — ABNORMAL LOW (ref 22–32)
Calcium: 8.7 mg/dL — ABNORMAL LOW (ref 8.9–10.3)
Chloride: 107 mmol/L (ref 98–111)
Creatinine, Ser: 0.84 mg/dL (ref 0.44–1.00)
GFR calc Af Amer: >60 mL/min (ref >60)
GFR calc non Af Amer: >60 mL/min (ref >60)
Glucose, Bld: 100 mg/dL — ABNORMAL HIGH (ref 70–99)
Potassium: 3.4 mmol/L — ABNORMAL LOW (ref 3.5–5.1)
Sodium: 137 mmol/L (ref 135–145)

## 2019-11-27 LAB — CREATININE, SERUM
Creatinine, Ser: 0.79 mg/dL (ref 0.44–1.00)
GFR calc Af Amer: >60 mL/min
GFR calc non Af Amer: >60 mL/min

## 2019-11-27 LAB — TSH: TSH: 2.153 u[IU]/mL (ref 0.350–4.500)

## 2019-11-27 LAB — PREGNANCY, URINE: Preg Test, Ur: NEGATIVE

## 2019-11-27 LAB — HCG, SERUM, QUALITATIVE: Preg, Serum: NEGATIVE

## 2019-11-27 LAB — MAGNESIUM: Magnesium: 1.5 mg/dL — ABNORMAL LOW (ref 1.7–2.4)

## 2019-11-27 LAB — I-STAT BETA HCG BLOOD, ED (MC, WL, AP ONLY): I-stat hCG, quantitative: 7.7 m[IU]/mL — ABNORMAL HIGH (ref <5)

## 2019-11-27 LAB — BRAIN NATRIURETIC PEPTIDE: B Natriuretic Peptide: 806.2 pg/mL — ABNORMAL HIGH (ref 0.0–100.0)

## 2019-11-27 LAB — TROPONIN I (HIGH SENSITIVITY)
Troponin I (High Sensitivity): 5 ng/L (ref <18)
Troponin I (High Sensitivity): 4 ng/L (ref <18)

## 2019-11-27 MED ORDER — NITROGLYCERIN 0.4 MG SL SUBL
0.4000 mg | SUBLINGUAL_TABLET | SUBLINGUAL | Status: DC | PRN
  Administered 2019-11-27 (×2): 0.4 mg via SUBLINGUAL
  Filled 2019-11-27: qty 1

## 2019-11-27 MED ORDER — AMIODARONE HCL IN DEXTROSE 360-4.14 MG/200ML-% IV SOLN
60.0000 mg/h | INTRAVENOUS | Status: AC
  Administered 2019-11-27 (×2): 60 mg/h via INTRAVENOUS
  Filled 2019-11-27 (×2): qty 200

## 2019-11-27 MED ORDER — HEPARIN SODIUM (PORCINE) 5000 UNIT/ML IJ SOLN
5000.0000 [IU] | Freq: Three times a day (TID) | INTRAMUSCULAR | Status: DC
  Administered 2019-11-27 – 2019-11-28 (×2): 5000 [IU] via SUBCUTANEOUS
  Filled 2019-11-27 (×4): qty 1

## 2019-11-27 MED ORDER — GABAPENTIN 100 MG PO CAPS: 100.0000 mg | ORAL_CAPSULE | Freq: Two times a day (BID) | ORAL | Status: DC | PRN

## 2019-11-27 MED ORDER — ALBUTEROL SULFATE (2.5 MG/3ML) 0.083% IN NEBU: 3.0000 mL | INHALATION_SOLUTION | Freq: Four times a day (QID) | RESPIRATORY_TRACT | Status: DC | PRN

## 2019-11-27 MED ORDER — TORSEMIDE 20 MG PO TABS
20.0000 mg | ORAL_TABLET | Freq: Every day | ORAL | Status: DC
  Administered 2019-11-27: 20 mg via ORAL
  Filled 2019-11-27 (×2): qty 1

## 2019-11-27 MED ORDER — AMIODARONE HCL IN DEXTROSE 360-4.14 MG/200ML-% IV SOLN
30.0000 mg/h | INTRAVENOUS | Status: DC
  Administered 2019-11-27: 30 mg/h via INTRAVENOUS
  Filled 2019-11-27 (×2): qty 200

## 2019-11-27 MED ORDER — TIZANIDINE HCL 4 MG PO TABS: 4.0000 mg | ORAL_TABLET | Freq: Three times a day (TID) | ORAL | Status: DC | PRN

## 2019-11-27 MED ORDER — MONTELUKAST SODIUM 10 MG PO TABS
10.0000 mg | ORAL_TABLET | Freq: Every day | ORAL | Status: DC
  Administered 2019-11-27: 10 mg via ORAL
  Filled 2019-11-27: qty 1

## 2019-11-27 MED ORDER — COLESTIPOL HCL 1 G PO TABS
2.0000 g | ORAL_TABLET | Freq: Every day | ORAL | Status: DC | PRN
  Filled 2019-11-27: qty 2

## 2019-11-27 MED ORDER — SODIUM CHLORIDE 0.9% FLUSH: 3.0000 mL | Freq: Once | INTRAVENOUS | Status: DC

## 2019-11-27 MED ORDER — DICYCLOMINE HCL 20 MG PO TABS
20.0000 mg | ORAL_TABLET | Freq: Every day | ORAL | Status: DC | PRN
  Filled 2019-11-27: qty 1

## 2019-11-27 MED ORDER — SACUBITRIL-VALSARTAN 97-103 MG PO TABS
1.0000 | ORAL_TABLET | Freq: Two times a day (BID) | ORAL | Status: DC
  Administered 2019-11-27 – 2019-11-28 (×2): 1 via ORAL
  Filled 2019-11-27 (×4): qty 1

## 2019-11-27 MED ORDER — ACETAMINOPHEN 325 MG PO TABS
650.0000 mg | ORAL_TABLET | ORAL | Status: DC | PRN
  Administered 2019-11-27 – 2019-11-28 (×2): 650 mg via ORAL
  Filled 2019-11-27 (×2): qty 2

## 2019-11-27 MED ORDER — SODIUM CHLORIDE 0.9% FLUSH
3.0000 mL | INTRAVENOUS | Status: DC | PRN
  Administered 2019-11-27: 3 mL via INTRAVENOUS

## 2019-11-27 MED ORDER — MOMETASONE FURO-FORMOTEROL FUM 200-5 MCG/ACT IN AERO
2.0000 | INHALATION_SPRAY | Freq: Two times a day (BID) | RESPIRATORY_TRACT | Status: DC
  Administered 2019-11-28: 2 via RESPIRATORY_TRACT
  Filled 2019-11-27 (×2): qty 8.8

## 2019-11-27 MED ORDER — CARVEDILOL 6.25 MG PO TABS
18.7500 mg | ORAL_TABLET | Freq: Two times a day (BID) | ORAL | Status: DC
  Administered 2019-11-27 – 2019-11-28 (×2): 18.75 mg via ORAL
  Filled 2019-11-27 (×2): qty 1

## 2019-11-27 MED ORDER — IVABRADINE HCL 7.5 MG PO TABS
7.5000 mg | ORAL_TABLET | Freq: Two times a day (BID) | ORAL | Status: DC
  Administered 2019-11-27: 7.5 mg via ORAL
  Filled 2019-11-27 (×3): qty 1

## 2019-11-27 MED ORDER — SODIUM CHLORIDE 0.9% FLUSH: 3.0000 mL | Freq: Two times a day (BID) | INTRAVENOUS | Status: DC

## 2019-11-27 MED ORDER — ZOLPIDEM TARTRATE 5 MG PO TABS: 5.0000 mg | ORAL_TABLET | Freq: Every evening | ORAL | Status: DC | PRN

## 2019-11-27 MED ORDER — AMIODARONE LOAD VIA INFUSION
150.0000 mg | Freq: Once | INTRAVENOUS | Status: AC
  Administered 2019-11-27: 150 mg via INTRAVENOUS
  Filled 2019-11-27: qty 83.34

## 2019-11-27 MED ORDER — MAGNESIUM SULFATE 2 GM/50ML IV SOLN
2.0000 g | Freq: Once | INTRAVENOUS | Status: AC
  Administered 2019-11-28: 2 g via INTRAVENOUS
  Filled 2019-11-27: qty 50

## 2019-11-27 MED ORDER — ALPRAZOLAM 0.25 MG PO TABS: 0.2500 mg | ORAL_TABLET | Freq: Two times a day (BID) | ORAL | Status: DC | PRN

## 2019-11-27 MED ORDER — GABAPENTIN 300 MG PO CAPS: 300.0000 mg | ORAL_CAPSULE | Freq: Every evening | ORAL | Status: DC | PRN

## 2019-11-27 MED ORDER — ONDANSETRON HCL 4 MG/2ML IJ SOLN: 4.0000 mg | Freq: Four times a day (QID) | INTRAMUSCULAR | Status: DC | PRN

## 2019-11-27 MED ORDER — PANTOPRAZOLE SODIUM 40 MG PO TBEC
40.0000 mg | DELAYED_RELEASE_TABLET | Freq: Every day | ORAL | Status: DC
  Administered 2019-11-28: 40 mg via ORAL
  Filled 2019-11-27: qty 1

## 2019-11-27 MED ORDER — FLUTICASONE PROPIONATE 50 MCG/ACT NA SUSP
2.0000 | Freq: Every day | NASAL | Status: DC | PRN
  Filled 2019-11-27: qty 16

## 2019-11-27 MED ORDER — SODIUM CHLORIDE 0.9 % IV SOLN: 250.0000 mL | INTRAVENOUS | Status: DC | PRN

## 2019-11-27 MED ORDER — POTASSIUM CHLORIDE CRYS ER 10 MEQ PO TBCR
20.0000 meq | EXTENDED_RELEASE_TABLET | Freq: Two times a day (BID) | ORAL | Status: DC
  Administered 2019-11-27 – 2019-11-28 (×2): 20 meq via ORAL
  Filled 2019-11-27 (×4): qty 2

## 2019-11-27 MED ORDER — IPRATROPIUM-ALBUTEROL 0.5-2.5 (3) MG/3ML IN SOLN: 3.0000 mL | Freq: Four times a day (QID) | RESPIRATORY_TRACT | Status: DC | PRN

## 2019-11-27 NOTE — ED Triage Notes (Signed)
Sob since last pm  She awakened with more sob since last pm  She also had some hoarseness  Hx chf  lmp June 29th  Some mid-chest pain

## 2019-11-27 NOTE — ED Notes (Signed)
Pt monitor showing abnormal heart rhythms. Dr Zenia Resides made aware and was provided rhythm strips.

## 2019-11-27 NOTE — H&P (Addendum)
Cardiology history and physical    Patient ID: [name] MRN: [id number]; DOB: 1975/11/11  Admit date: 11/27/2019 Date of Consult: 11/27/2019  Primary Care Provider: Hoyt Koch, MD [facility] Cardiologist: Sinclair Grooms, MD  [facility] Electrophysiologist:  Cristopher Peru, MD  Advanced Heart Failure:  Dr. Haroldine Laws.  Patient Profile:   [name] is a 44 y.o. 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 Zenia Resides.  CHIEF Complaint SOB   History of Present Illness:   [name] with above hx and PVC ablation 12/01/17, not successful, now on amiodarone.  Recent admit 10/2019 with chest pain and SOB.  Negative viral panel. Echo with EF<20% and normal RV, had Rt Heat cath with preserved cardiac output.  Prior cardiac cath 02/2014 with normal coronary arteries.  ICD placed 2018.  On OV 11/23/19 she was stable.  Plan for outpt sleep no change in meds.   Presented to ER today with SOB, began yesterday and some mid chest pain. Has increased dyspnea on exertion.  Does not feel like her asthma but her HF.  Usually slower to progress this was fast.  Had to sit up last pm and could hardly walk in house due to SOB.  Chest tightness worse with deep breath.  Is aware of her PVCs and more freq than usual.  EKG:  The EKG was personally reviewed and demonstrates:  SR with PVCs bigeminy and 3 beast NSVT, but looking at old EKGs no changes and no acute ST changes Telemetry:  Telemetry was personally reviewed and demonstrates:  freq PVCs   Hs Troponin 5, 4  Na 137, K+ 3.4, Cr 0.84  BNP 806 up from 754 in June Hgb 9.0 and WBC 8.5 plts 252  2V CXR  IMPRESSION: No radiographic evidence of acute  cardiopulmonary disease.  BP 136/90 to 151/101 Lungs are clear but she is somewhat SOB with talking     Past Medical History:  Diagnosis Date  . Acute on chronic systolic (congestive) heart failure ([place]) 04/29/2017  . Acute pain of right shoulder 06/16/2017  . AICD (automatic cardioverter/defibrillator) present   . Anxiety   . Arthritis    right shoulder   . Asthma   . CHF (congestive heart failure) ([place])   . Chronic combined systolic and diastolic heart failure ([place]) 03/05/2014  . Closed low lateral malleolus fracture 10/23/2013  . Cystitis 10/21/2017  . Depression   . Depression with anxiety 01/20/2013  . Diabetes mellitus without complication ([place])   . Diverticulosis   . Dyspnea    comes and goes intermittently mostly with exertion   . Essential hypertension    Prev followed by [facility]/ Cardiology   . Fibroid    age 22  . Gallstones   . Generalized abdominal cramping   . History of cardiomyopathy   . Hypertension   . IBS (irritable bowel syndrome)   . 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 Insomnia 05/07/2017  . Labral tear of shoulder 04/04/2015    Injected 04/04/2015 Injected 12/03/2015   . Migraine    "monthly" (08/03/2016)  . Myofascial pain 06/16/2017  . NICM (nonischemic cardiomyopathy) ([place]) 08/03/2016  . Nonallopathic  lesion of lumbosacral region 11/16/2016  . Nonallopathic lesion of sacral region 11/16/2016  . Nonallopathic lesion of thoracic region 08/20/2014  . Nonspecific chest pain 04/28/2017  . OSA (obstructive sleep apnea) 01/02/2013   NPSG 2009:  AHI 9/hr. CPAP intolerance >> "smothering" Good tolerance of auto device (optimal pressure 12-13 on download).  - referred to Dr Gwenette Greet    . OSA on CPAP   . Ovarian cyst    1999; surgically removed  . Patellofemoral syndrome of both knees 10/16/2016  . 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 PVC (premature ventricular contraction) 06/23/2016  . Seizures  ([place])    "as a child" (08/03/2016)  . Termination of pregnancy    due to cardiac risk    Past Surgical History:  Procedure Laterality Date  . CARDIAC CATHETERIZATION N/A 11/11/2015   Procedure: Right/Left Heart Cath and Coronary Angiography;  Surgeon: Troy Sine, MD;  Location: [facility];  Service: Cardiovascular;  Laterality: N/A;  . CARDIAC CATHETERIZATION  ~ 2015  . CARDIAC DEFIBRILLATOR PLACEMENT  08/03/2016  . CESAREAN SECTION  1999  . COLONOSCOPY WITH PROPOFOL N/A 04/21/2016   Procedure: COLONOSCOPY WITH PROPOFOL;  Surgeon: Jerene Bears, MD;  Location: [facility];  Service: Gastroenterology;  Laterality: N/A;  . ESOPHAGOGASTRODUODENOSCOPY (EGD) WITH PROPOFOL N/A 04/21/2016   Procedure: ESOPHAGOGASTRODUODENOSCOPY (EGD) WITH PROPOFOL;  Surgeon: Jerene Bears, MD;  Location: [facility];  Service: Gastroenterology;  Laterality: N/A;  . FOOT FRACTURE SURGERY Right ~ 2003  . FRACTURE SURGERY    . ICD IMPLANT N/A 08/03/2016   Procedure: ICD Implant;  Surgeon: Deboraha Sprang, MD;  Location: [place] [facility];  Service: Cardiovascular;  Laterality: N/A;  . LAPAROSCOPIC CHOLECYSTECTOMY  12/2006  . LAPAROSCOPIC GASTRIC SLEEVE RESECTION    . LAPAROSCOPY ABDOMEN DIAGNOSTIC  2008   "cut bile duct w/gallbladder OR; had to go in later & fix leak; hospitalized for 2 months"  . LEFT HEART CATHETERIZATION WITH CORONARY ANGIOGRAM N/A 02/26/2014   Procedure: LEFT HEART CATHETERIZATION WITH CORONARY ANGIOGRAM;  Surgeon: Jettie Booze, MD;  Location: [facility];  Service: Cardiovascular;  Laterality: N/A;  . OVARIAN CYST REMOVAL Right 1999  . PVC ABLATION N/A 12/01/2017   Procedure: PVC ABLATION;  Surgeon: Evans Lance, MD;  Location: [place] [facility];  Service: Cardiovascular;  Laterality: N/A;  . RIGHT HEART CATH N/A 08/05/2017   Procedure: RIGHT HEART CATH;  Surgeon: Jolaine Artist, MD;  Location: [place] [facility];  Service: Cardiovascular;  Laterality: N/A;  . RIGHT  HEART CATH N/A 11/16/2019   Procedure: RIGHT HEART CATH;  Surgeon: Jolaine Artist, MD;  Location: [place] [facility];  Service: Cardiovascular;  Laterality: N/A;  . TUBAL LIGATION  1999     Home Medications:  Prior to Admission medications   Medication Sig Start Date End Date Taking? Authorizing Provider  albuterol (PROAIR HFA) 108 (90 Base) MCG/ACT inhaler Inhale 1-2 puffs into the lungs every 6 (six) hours as needed for wheezing or shortness of breath. 08/17/19  Yes Biagio Borg, MD  amiodarone (PACERONE) 200 MG tablet Take 1 tablet by mouth once daily Patient taking differently: Take 200 mg by mouth daily with lunch.  02/09/19  Yes Larey Dresser, MD  butalbital-acetaminophen-caffeine (FIORICET) 50-325-40 MG tablet TAKE 1 TO 2 TABLETS BY MOUTH EVERY 6 HOURS AS NEEDED FOR HEADACHE Patient taking differently: Take 1-2 tablets by mouth every 6 (six) hours as needed for headache  or migraine.  12/05/18  Yes Hoyt Koch, MD  carvedilol (COREG) 12.5 MG tablet Take 1 tablet (12.5 mg total) by mouth 2 (two) times daily. 11/17/19 12/17/19 Yes Dahal, Marlowe Aschoff, MD  colestipol (COLESTID) 1 g tablet Take 2 tablets (2 g total) by mouth 2 (two) times daily. Patient taking differently: Take 2 g by mouth daily as needed (IBS).  02/10/19  Yes Hoyt Koch, MD  diclofenac sodium (VOLTAREN) 1 % GEL Apply 4 g topically 4 (four) times daily. Patient taking differently: Apply 4 g topically 4 (four) times daily as needed (pain).  06/23/18  Yes Hoyt Koch, MD  dicyclomine (BENTYL) 20 MG tablet Take 20 mg by mouth daily as needed for spasms.    Yes [provider]  fluticasone (FLONASE) 50 MCG/ACT nasal spray Place 2 sprays into both nostrils daily as needed for allergies. 07/25/18  Yes Hoyt Koch, MD  gabapentin (NEURONTIN) 100 MG capsule Take 100 mg by mouth 2 (two) times daily as needed (back spasms during the day).    Yes [provider]  gabapentin (NEURONTIN)  300 MG capsule Take 300 mg by mouth at bedtime as needed (back spasms).   Yes [provider]  ipratropium-albuterol (DUONEB) 0.5-2.5 (3) MG/3ML SOLN Take 3 mLs by nebulization every 6 (six) hours as needed. 11/17/19 12/17/19 Yes Dahal, Marlowe Aschoff, MD  ivabradine (CORLANOR) 7.5 MG TABS tablet TAKE 1 TABLET (7.5 MG TOTAL) BY MOUTH 2 (TWO) TIMES DAILY WITH A MEAL. Patient taking differently: Take 7.5 mg by mouth daily with lunch.  02/15/19  Yes Bensimhon, Shaune Pascal, MD  lidocaine (LIDODERM) 5 % Place 1 patch onto the skin daily. Remove & Discard patch within 12 hours or as directed by MD Patient taking differently: Place 1 patch onto the skin 2 (two) times daily as needed (pain). Remove & Discard patch within 12 hours or as directed by MD 01/17/18  Yes Hoyt Koch, MD  Magnesium Oxide 200 MG TABS Take 1 tablet (200 mg total) by mouth daily. 11/23/19  Yes Clegg, Amy D, NP  montelukast (SINGULAIR) 10 MG tablet Take 1 tablet (10 mg total) by mouth at bedtime. 09/13/19  Yes Fenton Foy, NP  Multiple Vitamins-Minerals (MULTIVITAMIN GUMMIES WOMENS PO) Take 2 tablets by mouth at bedtime.    Yes [provider]  nitroGLYCERIN (NITROSTAT) 0.4 MG SL tablet Place 1 tablet (0.4 mg total) under the tongue every 5 (five) minutes as needed for chest pain. 09/05/19  Yes Bensimhon, Shaune Pascal, MD  omeprazole (PRILOSEC) 20 MG capsule Take 20 mg by mouth daily as needed (for reflux symptoms).  09/09/17 11/27/19 Yes [provider]  potassium chloride (KLOR-CON) 10 MEQ tablet Take 20 mEq by mouth 2 (two) times daily. 10/31/19  Yes [provider]  sacubitril-valsartan (ENTRESTO) 97-103 MG Take 1 tablet by mouth 2 (two) times daily. 09/05/19  Yes Bensimhon, Shaune Pascal, MD  SYMBICORT 160-4.5 MCG/ACT inhaler INHALE 2 PUFFS INTO THE LUNGS 2 (TWO) TIMES DAILY AS NEEDED. Patient taking differently: Inhale 2 puffs into the lungs 2 (two) times daily as needed (shortness of breath/wheezing).  05/03/18   Yes Lyndal Pulley, DO  tiZANidine (ZANAFLEX) 4 MG tablet Take 4 mg by mouth every 8 (eight) hours as needed for muscle spasms.  12/02/18  Yes [provider]  torsemide (DEMADEX) 20 MG tablet Take 1 tablet (20 mg total) by mouth daily. 11/18/19 12/18/19 Yes Dahal, Marlowe Aschoff, MD  Ubrogepant (UBRELVY) 50 MG TABS Take 50 mg  by mouth every 2 (two) hours as needed (migraine). Patient taking differently: Take 50 mg by mouth See admin instructions. Take one tablet (50 mg) by mouth at onset of migraine headache, may repeat in 30 minutes if still needed. 10/17/19  Yes Hoyt Koch, MD  Vitamin D, Ergocalciferol, (DRISDOL) 1.25 MG (50000 UT) CAPS capsule Take 1 capsule (50,000 Units total) by mouth every 7 (seven) days. Patient taking differently: Take 50,000 Units by mouth every Wednesday.  08/01/18  Yes Lyndal Pulley, DO    Inpatient Medications: Scheduled Meds: . sodium chloride flush  3 mL Intravenous Once   Continuous Infusions:  PRN Meds:   Allergies:    Allergies  Allergen Reactions  . Vancomycin Other (See Comments)    "did something to my kidneys," PROGRESSED TO KIDNEY FAILURE!!  Marland Kitchen Aspirin Other (See Comments)    Wheezing, (Pt states that she just wheezes some when she takes aspirin by itself but she can take aspirin in a combination product).  . Contrast Media [Iodinated Diagnostic Agents] Other (See Comments)    Multiple CT contrast studies done over 2 weeks caused ARF  . Avocado Itching and Swelling  . Ciprofloxacin Itching and Rash  . Farxiga [Dapagliflozin] Rash  . Sulfa Antibiotics Itching and Rash    Social History:   Social History   Socioeconomic History  . Marital status: Married    Spouse name: Not on file  . Number of children: 1  . Years of education: Not on file  . Highest education level: Not on file  Occupational History  . Occupation: stay at home mom  Tobacco Use  . Smoking status: Never Smoker  . Smokeless tobacco: Never Used  Vaping Use   . Vaping Use: Never used  Substance and Sexual Activity  . Alcohol use: No  . Drug use: No  . Sexual activity: Yes  Other Topics Concern  . Not on file  Social History Narrative  . Not on file   Social Determinants of Health   Financial Resource Strain:   . Difficulty of Paying Living Expenses:   Food Insecurity: No Food Insecurity  . Worried About Charity fundraiser in the Last Year: Never true  . Ran Out of Food in the Last Year: Never true  Transportation Needs: No Transportation Needs  . Lack of Transportation (Medical): No  . Lack of Transportation (Non-Medical): No  Physical Activity:   . Days of Exercise per Week:   . Minutes of Exercise per Session:   Stress:   . Feeling of Stress :   Social Connections:   . Frequency of Communication with Friends and Family:   . Frequency of Social Gatherings with Friends and Family:   . Attends Religious Services:   . Active Member of Clubs or Organizations:   . Attends Archivist Meetings:   Marland Kitchen Marital Status:   Intimate Partner Violence:   . Fear of Current or Ex-Partner:   . Emotionally Abused:   Marland Kitchen Physically Abused:   . Sexually Abused:     Family History:    Family History  Problem Relation Age of Onset  . Emphysema Maternal Grandmother        smoked  . Heart disease Maternal Grandmother 67       MI  . Rheum arthritis Mother   . Allergies Daughter   . Colon cancer Neg Hx      ROS:  Please see the history of present illness.  General:no colds or  fevers, no weight changes Skin:no rashes or ulcers HEENT:no blurred vision, no congestion CV:see HPI PUL:see HPI GI:no diarrhea constipation or melena, no indigestion GU:no hematuria, no dysuria MS:no joint pain, no claudication Neuro:no syncope, no lightheadedness Endo:+ diabetes, no thyroid disease  All other ROS reviewed and negative.     Physical Exam/Data:   Vitals:   11/27/19 0652 11/27/19 0801 11/27/19 0843 11/27/19 0859  BP:  118/84 (!)  141/77 102/75  Pulse:  98 97 100  Resp:  (!) 25 (!) 32 (!) 26  Temp:      TempSrc:      SpO2:  98% 97% 97%  Weight: 95.3 kg 99.3 kg    Height: 5\' 6"  (1.676 m) 5\' 6"  (1.676 m)     No intake or output data in the 24 hours ending 11/27/19 0949 Last 3 Weights 11/27/2019 11/27/2019 11/23/2019  Weight (lbs) 219 lb 210 lb 220 lb  Weight (kg) 99.338 kg 95.255 kg 99.791 kg     Body mass index is 35.35 kg/m.  General:  Well nourished, well developed, in no acute distress but with talking increase of SOB HEENT: normal Lymph: no adenopathy Neck: no JVD Endocrine:  No thryomegaly Vascular: No carotid bruits; pedal pulses 2+ bilaterally  Cardiac:  normal S1, S2; RRR; no murmur gallup rub or click Lungs:  clear to auscultation bilaterally, no wheezing, rhonchi or rales  Abd: soft, nontender, no hepatomegaly  Ext: no edema Musculoskeletal:  No deformities, BUE and BLE strength normal and equal Skin: warm and dry  Neuro: alert and oriented X 3 MAE follows commands, no focal abnormalities noted Psych:  Normal affect    Relevant CV Studies: RHC 10/2019  RA = 4 RV = 32/6 PA = 31/8 (21) PCW = 12 Fick cardiac output/index = 7.0/3.4 Thermo CO/CI = 5.2/2.5 PVR =1.7 (Thermo) Ao sat = 98% PA sat = 67%, 69%  Echo done 05/19/2018 by Dr Haroldine Laws --. EF back down to 20-25% - Bedside echo by Dr. Haroldine Laws with EF improved to 50%. Echo 6/20 - EF back down at 25-30%.  Laboratory Data:  High Sensitivity Troponin:   Recent Labs  Lab 11/12/19 1249 11/12/19 1746 11/27/19 0645  TROPONINIHS 4 5 5      Chemistry Recent Labs  Lab 11/23/19 1433 11/27/19 0645  NA 138 137  K 4.1 3.4*  CL 105 107  CO2 26 21*  GLUCOSE 99 100*  BUN 10 9  CREATININE 0.84 0.84  CALCIUM 9.0 8.7*  GFRNONAA >60 >60  GFRAA >60 >60  ANIONGAP 7 9    No results for input(s): PROT, ALBUMIN, AST, ALT, ALKPHOS, BILITOT in the last 168 hours. Hematology Recent Labs  Lab 11/27/19 0645  WBC 8.5  RBC 3.90  HGB 9.0*    HCT 30.6*  MCV 78.5*  MCH 23.1*  MCHC 29.4*  RDW 16.1*  PLT 252   BNP Recent Labs  Lab 11/27/19 0737  BNP 806.2*    DDimer No results for input(s): DDIMER in the last 168 hours.   Radiology/Studies:  DG Chest 2 View  Result Date: 11/27/2019 CLINICAL DATA:  44 year old female with history of shortness of breath since yesterday evening. EXAM: CHEST - 2 VIEW COMPARISON:  Chest x-ray 10/25/2019. FINDINGS: Lung volumes are normal. No consolidative airspace disease. No pleural effusions. No pneumothorax. No pulmonary nodule or mass noted. Pulmonary vasculature and the cardiomediastinal silhouette are within normal limits. Left-sided pacemaker/AICD with lead tip projecting over the expected location of the right ventricle. Surgical clips project  over the right upper quadrant of the abdomen, likely from prior cholecystectomy. IMPRESSION: No radiographic evidence of acute cardiopulmonary disease. Electronically Signed   By: Vinnie Langton M.D.   On: 11/27/2019 07:36          Assessment and Plan:   1. Acute systolic and diastolic HF with hx of chronic HF  was stable on 11/23/19 on AHF visit. Sudden onset of CHF-- on coreg 12.5 BID, Ivabradine 7.5 BID,  entresto 97-103 BID torsemide 20 mg daily  2. Chest pain most likely related to SOB normal coronary arteries in 2015, she recently had Rt heart cath.  Negative troponins  3. PVCs in past had unsuccessful ablation, has been on amiodarone.  (recent TSH was 1.722 and LFTs wnl in April)  Mg+ po recently added to meds  Now more NSVT up to 7 beats, concern increase of ectopy playing a role in SOB  (just turned in zio patch no results yet) 4. ICD [employer]  5. DM-2 allergy to farxiga  6. Hx of OSA for repeat study soon 7. Morbid Obesity with hx gastric sleeve.  8. Hx asthma on inhalers nebs and singulair.     Dr. Harl Bowie to see.   For questions or updates, please contact [place] Please consult [email or web address] for contact info under     Signed, Cecilie Kicks, NP  11/27/2019 9:49 AM    Attending note:  Patient seen and examined.  I reviewed her records and discussed the case with Ms. Ingold NP.  Ms. Klee has a history of nonischemic cardiomyopathy with peripartum cardiomyopathy diagnosed in 1999.  She has a Chemical engineer ICD in place and has a previous history of PVC ablation attempt in 2019.  She was recently hospitalized in June with right heart catheterization demonstrating reasonable hemodynamic compensation, medication adjustments made, and plan for further evaluation of increasing PVC burden.  She just recently wore an outpatient cardiac monitor, results pending.  She has pending referral to EP as well.  Follow-up in the heart failure clinic noted on June 1.  She presents to the [facility] ER stating that she has felt a worsening sense of shortness of breath, orthopnea, intermittent cough, and fluttering in her chest since yesterday.  She has had no syncope or device shocks.  She went out for her anniversary on July 1, but does not describe any major alteration in fluid intake or sodium intake, no recent change in weight, also compliance with her medications.  On examination in the ER she is in no distress.  Telemetry shows frequent PVCs and bursts of NSVT.  She is afebrile.  Heart rate 90s to low 100s in sinus rhythm at baseline.  Systolic blood pressure 465-035.  Lungs are clear, no wheezing.  Intermittent A waves noted in jugular venous pulse.  Cardiac exam reveals RRR with ectopy and S3.  Pertinent lab work includes potassium 3.4, magnesium 1.5, BUN 9, creatinine 0.84, BNP 806, high-sensitivity troponin I levels normal, hemoglobin 9.0 which is down from 10.4, platelets 252.  I personally reviewed her ECG which shows sinus tachycardia with ventricular bigeminy, PVCs potentially from the outflow tract origin.  Chest x-ray reports no acute findings.  Recent worsening shortness of breath and orthopnea in association  with palpitations and documentation of frequent PVC burden and bursts of NSVT which are likely leading to acute on chronic combined heart failure exacerbation.  ECG suggests outflow tract origin of PVCs.  She has a prior history of VT ablation in 2019, however  it would appear that PVC burden has increased substantially since that time.  She just recently turned in an outpatient cardiac monitor with results pending.  Recent hemodynamics were reasonable by right heart catheterization she does not appear fluid overloaded.  Plan is admission for further stabilization on IV amiodarone, increase Coreg dose as tolerated, also replete electrolytes.  She is more anemic than at recent hospital stay, denies any bleeding problems, will follow closely and guaiac stools.  Remainder of her cardiac regimen looks good.  Can seek EP consultation as an inpatient if necessary depending on suppression of PVCs.  She would likely be a reasonable candidate for repeat VT ablation attempt.  Satira Sark, M.D., F.A.C.C.

## 2019-11-27 NOTE — Progress Notes (Signed)
Was notified by pharmacy of elevated hcg levels , asking to contact MD for further investigation.  They said to continue amnio infusion  MD notified  Will continue to monitor

## 2019-11-27 NOTE — ED Provider Notes (Signed)
[facility] EMERGENCY DEPARTMENT Provider Note   CSN: [id number] Arrival date & time: 11/27/19  3295     History Chief Complaint  Patient presents with  . Shortness of Breath    [name] is a 44 y.o. female.  44 year old female with history of postpartum cardiomyopathy who presents with increasing shortness of breath which began yesterday.  Patient was admitted to the hospital by myself about a week ago for similar symptoms and stayed for close to a week.  Had her medications adjusted as well as had a heart catheterization per patient which did not show any new occlusions.  Patient states that yesterday she became acutely short of breath and felt like there was a tickle in her throat. Patient states that the symptoms only occur when she coughs. States that her work of breathing is otherwise at his baseline. Denies any fever or chills.  Has had increased dyspnea on exertion.  States that the symptoms are similar to her prior heart failure exacerbations.        Past Medical History:  Diagnosis Date  . Acute on chronic systolic (congestive) heart failure ([place]) 04/29/2017  . Acute pain of right shoulder 06/16/2017  . AICD (automatic cardioverter/defibrillator) present   . Anxiety   . Arthritis    right shoulder   . Asthma   . CHF (congestive heart failure) ([place])   . Chronic combined systolic and diastolic heart failure ([place]) 03/05/2014  . Closed low lateral malleolus fracture 10/23/2013  . Cystitis 10/21/2017  . Depression   . Depression with anxiety 01/20/2013  . Diabetes mellitus without complication ([place])   . Diverticulosis   . Dyspnea    comes and goes intermittently mostly with exertion   . Essential hypertension    Prev [facility] by [facility]/ Cardiology   . Fibroid    age 10  . Gallstones   . Generalized abdominal cramping   . History of cardiomyopathy   . Hypertension   . IBS (irritable bowel syndrome)   . ICD (implantable  cardioverter-defibrillator), single, 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  . Insomnia 05/07/2017  . Labral tear of shoulder 04/04/2015    Injected 04/04/2015 Injected 12/03/2015   . Migraine    "monthly" (08/03/2016)  . Myofascial pain 06/16/2017  . NICM (nonischemic cardiomyopathy) ([place]) 08/03/2016  . Nonallopathic lesion of lumbosacral region 11/16/2016  . Nonallopathic lesion of sacral region 11/16/2016  . Nonallopathic lesion of thoracic region 08/20/2014  . Nonspecific chest pain 04/28/2017  . OSA (obstructive sleep apnea) 01/02/2013   NPSG 2009:  AHI 9/hr. CPAP intolerance >> "smothering" Good tolerance of auto device (optimal pressure 12-13 on download).  - referred to Dr Gwenette Greet    . OSA on CPAP   . Ovarian cyst    1999; surgically removed  . Patellofemoral syndrome of both knees 10/16/2016  . 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 PVC (premature ventricular contraction) 06/23/2016  . Seizures ([place])    "as a child" (08/03/2016)  . Termination of pregnancy    due to cardiac risk    Patient Active Problem List   Diagnosis Date Noted  . V-tach ([place]) 11/12/2019  . Asthma with acute exacerbation 09/14/2019  . Headache 10/26/2018  . Chronic right-sided low back pain without sciatica 06/24/2018  . Trigger point of shoulder region, right 06/13/2018  . Patellar subluxation, left, initial encounter 04/25/2018  . Left flank pain 01/14/2018  . 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 Myofascial pain 06/16/2017  .  Insomnia 05/07/2017  . History of cardiomyopathy   . Acute on chronic systolic (congestive) heart failure ([place]) 04/29/2017  . Nonspecific chest pain 04/28/2017  . 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 Patellofemoral syndrome of both knees 10/16/2016  . NICM (nonischemic cardiomyopathy) ([place]) 08/03/2016  . PVC (premature ventricular contraction) 06/23/2016  . Labral tear of shoulder 04/04/2015  . Chronic combined systolic and  diastolic heart failure ([place]) 03/05/2014  . IBS (irritable bowel syndrome)   . Depression with anxiety 01/20/2013  . OSA (obstructive sleep apnea) 01/02/2013  . 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 Essential hypertension     Past Surgical History:  Procedure Laterality Date  . CARDIAC CATHETERIZATION N/A 11/11/2015   Procedure: Right/Left Heart Cath and Coronary Angiography;  Surgeon: Troy Sine, MD;  Location: [facility] CV LAB;  Service: Cardiovascular;  Laterality: N/A;  . CARDIAC CATHETERIZATION  ~ 2015  . CARDIAC DEFIBRILLATOR PLACEMENT  08/03/2016  . CESAREAN SECTION  1999  . COLONOSCOPY WITH PROPOFOL N/A 04/21/2016   Procedure: COLONOSCOPY WITH PROPOFOL;  Surgeon: Jerene Bears, MD;  Location: [facility];  Service: Gastroenterology;  Laterality: N/A;  . ESOPHAGOGASTRODUODENOSCOPY (EGD) WITH PROPOFOL N/A 04/21/2016   Procedure: ESOPHAGOGASTRODUODENOSCOPY (EGD) WITH PROPOFOL;  Surgeon: Jerene Bears, MD;  Location: [facility];  Service: Gastroenterology;  Laterality: N/A;  . FOOT FRACTURE SURGERY Right ~ 2003  . FRACTURE SURGERY    . ICD IMPLANT N/A 08/03/2016   Procedure: ICD Implant;  Surgeon: Deboraha Sprang, MD;  Location: [facility] CV LAB;  Service: Cardiovascular;  Laterality: N/A;  . LAPAROSCOPIC CHOLECYSTECTOMY  12/2006  . LAPAROSCOPIC GASTRIC SLEEVE RESECTION    . LAPAROSCOPY ABDOMEN DIAGNOSTIC  2008   "cut bile duct w/gallbladder OR; had to go in later & fix leak; hospitalized for 2 months"  . LEFT HEART CATHETERIZATION WITH CORONARY ANGIOGRAM N/A 02/26/2014   Procedure: LEFT HEART CATHETERIZATION WITH CORONARY ANGIOGRAM;  Surgeon: Jettie Booze, MD;  Location: [facility] CATH LAB;  Service: Cardiovascular;  Laterality: N/A;  . OVARIAN CYST REMOVAL Right 1999  . PVC ABLATION N/A 12/01/2017   Procedure: PVC ABLATION;  Surgeon: Evans Lance, MD;  Location: [facility] CV LAB;  Service: Cardiovascular;  Laterality: N/A;  . RIGHT HEART CATH N/A 08/05/2017    Procedure: RIGHT HEART CATH;  Surgeon: Jolaine Artist, MD;  Location: [facility] LAB;  Service: Cardiovascular;  Laterality: N/A;  . RIGHT HEART CATH N/A 11/16/2019   Procedure: RIGHT HEART CATH;  Surgeon: Jolaine Artist, MD;  Location: [facility] CV LAB;  Service: Cardiovascular;  Laterality: N/A;  . TUBAL LIGATION  1999     OB History    Gravida  2   Para  1   Term      Preterm      AB  1   Living  1     SAB      TAB  1   Ectopic      Multiple      Live Births              Family History  Problem Relation Age of Onset  . Emphysema Maternal Grandmother        smoked  . Heart disease Maternal Grandmother 59       MI  . Rheum arthritis Mother   . Allergies Daughter   . Colon cancer Neg Hx     Social History   Tobacco Use  . Smoking status: Never  Smoker  . Smokeless tobacco: Never Used  Vaping Use  . Vaping Use: Never used  Substance Use Topics  . Alcohol use: No  . Drug use: No    Home Medications Prior to Admission medications   Medication Sig Start Date End Date Taking? Authorizing Provider  albuterol (PROAIR HFA) 108 (90 Base) MCG/ACT inhaler Inhale 1-2 puffs into the lungs every 6 (six) hours as needed for wheezing or shortness of breath. 08/17/19   Biagio Borg, MD  amiodarone (PACERONE) 200 MG tablet Take 1 tablet by mouth once daily Patient taking differently: Take 200 mg by mouth daily with lunch.  02/09/19   Larey Dresser, MD  butalbital-acetaminophen-caffeine (FIORICET) 50-325-40 MG tablet TAKE 1 TO 2 TABLETS BY MOUTH EVERY 6 HOURS AS NEEDED FOR HEADACHE Patient taking differently: Take 1-2 tablets by mouth every 6 (six) hours as needed for headache or migraine.  12/05/18   Hoyt Koch, MD  carvedilol (COREG) 12.5 MG tablet Take 1 tablet (12.5 mg total) by mouth 2 (two) times daily. 11/17/19 12/17/19  Terrilee Croak, MD  colestipol (COLESTID) 1 g tablet Take 2 tablets (2 g total) by mouth 2 (two) times daily. Patient  taking differently: Take 2 g by mouth daily as needed (IBS).  02/10/19   Hoyt Koch, MD  diclofenac sodium (VOLTAREN) 1 % GEL Apply 4 g topically 4 (four) times daily. Patient taking differently: Apply 4 g topically 4 (four) times daily as needed (pain).  06/23/18   Hoyt Koch, MD  dicyclomine (BENTYL) 20 MG tablet Take 20 mg by mouth daily as needed for spasms.     [provider]  fluticasone (FLONASE) 50 MCG/ACT nasal spray Place 2 sprays into both nostrils daily as needed for allergies. 07/25/18   Hoyt Koch, MD  gabapentin (NEURONTIN) 100 MG capsule Take 100 mg by mouth 2 (two) times daily as needed (back spasms during the day).     [provider]  gabapentin (NEURONTIN) 300 MG capsule Take 300 mg by mouth at bedtime as needed (back spasms).    [provider]  ipratropium-albuterol (DUONEB) 0.5-2.5 (3) MG/3ML SOLN Take 3 mLs by nebulization every 6 (six) hours as needed. 11/17/19 12/17/19  Terrilee Croak, MD  ivabradine (CORLANOR) 7.5 MG TABS tablet TAKE 1 TABLET (7.5 MG TOTAL) BY MOUTH 2 (TWO) TIMES DAILY WITH A MEAL. Patient taking differently: Take 7.5 mg by mouth daily with lunch.  02/15/19   Bensimhon, Shaune Pascal, MD  lidocaine (LIDODERM) 5 % Place 1 patch onto the skin daily. Remove & Discard patch within 12 hours or as directed by MD Patient taking differently: Place 1 patch onto the skin 2 (two) times daily as needed (pain). Remove & Discard patch within 12 hours or as directed by MD 01/17/18   Hoyt Koch, MD  Magnesium Oxide 200 MG TABS Take 1 tablet (200 mg total) by mouth daily. 11/23/19   Clegg, Amy D, NP  montelukast (SINGULAIR) 10 MG tablet Take 1 tablet (10 mg total) by mouth at bedtime. 09/13/19   Fenton Foy, NP  Multiple Vitamins-Minerals (MULTIVITAMIN GUMMIES WOMENS PO) Take 2 tablets by mouth at bedtime.     [provider]  nitroGLYCERIN (NITROSTAT) 0.4 MG SL tablet Place 1 tablet (0.4 mg total) under  the tongue every 5 (five) minutes as needed for chest pain. 09/05/19   Bensimhon, Shaune Pascal, MD  omeprazole (PRILOSEC) 20 MG capsule Take 20 mg by mouth daily as needed (for  reflux symptoms).  09/09/17 11/23/19  [provider]  potassium chloride (KLOR-CON) 10 MEQ tablet Take 20 mEq by mouth 2 (two) times daily. 10/31/19   [provider]  sacubitril-valsartan (ENTRESTO) 97-103 MG Take 1 tablet by mouth 2 (two) times daily. 09/05/19   Bensimhon, Shaune Pascal, MD  SYMBICORT 160-4.5 MCG/ACT inhaler INHALE 2 PUFFS INTO THE LUNGS 2 (TWO) TIMES DAILY AS NEEDED. Patient taking differently: Inhale 2 puffs into the lungs 2 (two) times daily as needed (shortness of breath/wheezing).  05/03/18   Lyndal Pulley, DO  tiZANidine (ZANAFLEX) 4 MG tablet Take 4 mg by mouth every 8 (eight) hours as needed for muscle spasms.  12/02/18   [provider]  torsemide (DEMADEX) 20 MG tablet Take 1 tablet (20 mg total) by mouth daily. 11/18/19 12/18/19  Dahal, Marlowe Aschoff, MD  Ubrogepant (UBRELVY) 50 MG TABS Take 50 mg by mouth every 2 (two) hours as needed (migraine). Patient taking differently: Take 50 mg by mouth See admin instructions. Take one tablet (50 mg) by mouth at onset of migraine headache, may repeat in 30 minutes if still needed. 10/17/19   Hoyt Koch, MD  Vitamin D, Ergocalciferol, (DRISDOL) 1.25 MG (50000 UT) CAPS capsule Take 1 capsule (50,000 Units total) by mouth every 7 (seven) days. Patient taking differently: Take 50,000 Units by mouth every Wednesday.  08/01/18   Lyndal Pulley, DO    Allergies    Vancomycin, Aspirin, Contrast media [iodinated diagnostic agents], Avocado, Ciprofloxacin, Farxiga [dapagliflozin], and Sulfa antibiotics  Review of Systems   Review of Systems  All other systems reviewed and are negative.   Physical Exam Updated Vital Signs BP 136/90 (BP Location: Left Arm)   Pulse (!) 107   Temp 98.1 F (36.7 C) (Oral)   Resp 20   Ht 1.676 m (5\' 6" )   Wt  95.3 kg   LMP 11/19/2019   SpO2 94%   BMI 33.89 kg/m   Physical Exam Vitals and nursing note reviewed.  Constitutional:      General: She is not in acute distress.    Appearance: Normal appearance. She is well-developed. She is not toxic-appearing.  HENT:     Head: Normocephalic and atraumatic.  Eyes:     General: Lids are normal.     Conjunctiva/sclera: Conjunctivae normal.     Pupils: Pupils are equal, round, and reactive to light.  Neck:     Thyroid: No thyroid mass.     Trachea: No tracheal deviation.  Cardiovascular:     Rate and Rhythm: Normal rate and regular rhythm.     Heart sounds: Normal heart sounds. No murmur heard.  No gallop.   Pulmonary:     Effort: Pulmonary effort is normal. No respiratory distress.     Breath sounds: Normal breath sounds. No stridor. No decreased breath sounds, wheezing, rhonchi or rales.  Abdominal:     General: Bowel sounds are normal. There is no distension.     Palpations: Abdomen is soft.     Tenderness: There is no abdominal tenderness. There is no rebound.  Musculoskeletal:        General: No tenderness. Normal range of motion.     Cervical back: Normal range of motion and neck supple.  Skin:    General: Skin is warm and dry.     Findings: No abrasion or rash.  Neurological:     Mental Status: She is alert and oriented to person, place, and time.     GCS:  GCS eye subscore is 4. GCS verbal subscore is 5. GCS motor subscore is 6.     Cranial Nerves: No cranial nerve deficit.     Sensory: No sensory deficit.  Psychiatric:        Speech: Speech normal.        Behavior: Behavior normal.     ED Results / Procedures / Treatments   Labs (all labs ordered are listed, but only abnormal results are displayed) Labs Reviewed  BASIC METABOLIC PANEL  CBC  BRAIN NATRIURETIC PEPTIDE  I-STAT BETA HCG BLOOD, ED ([facility], [facility], [facility] ONLY)  TROPONIN I (HIGH SENSITIVITY)    EKG EKG Interpretation  Date/Time:  Monday November 27 2019 06:34:13  EDT Ventricular Rate:  109 PR Interval:  188 QRS Duration: 84 QT Interval:  340 QTC Calculation: 457 R Axis:   -64 Text Interpretation: Undetermined rhythm Left anterior fascicular block Anteroseptal infarct , age undetermined Abnormal ECG Confirmed by Lacretia Leigh ([id number]) on 11/27/2019 7:38:28 AM   Radiology No results found.  Procedures Procedures (including critical care time)  Medications Ordered in ED Medications  sodium chloride flush (NS) 0.9 % injection 3 mL (has no administration in time range)    ED Course  I have reviewed the triage vital signs and the nursing notes.  Pertinent labs & imaging results that were available during my care of the patient were reviewed by me and considered in my medical decision making (see chart for details).    MDM Rules/Calculators/A&P                          Patient here with symptoms concern for CHF exacerbation.  Chest x-ray without overt signs of volume overload however her BNP is elevated.  She does have significant ectopy noted on her monitor as well as runs of V. tach.  Discussed with Dr. Harl Bowie from cardiology who will come see the patient   final Clinical Impression(s) / ED Diagnoses Final diagnoses:  None    Rx / DC Orders ED Discharge Orders    None       Lacretia Leigh, MD 11/27/19 [phone]

## 2019-11-28 ENCOUNTER — Other Ambulatory Visit: Payer: Self-pay

## 2019-11-28 DIAGNOSIS — I5023 Acute on chronic systolic (congestive) heart failure: Secondary | ICD-10-CM

## 2019-11-28 DIAGNOSIS — I493 Ventricular premature depolarization: Secondary | ICD-10-CM | POA: Diagnosis not present

## 2019-11-28 DIAGNOSIS — R0602 Shortness of breath: Secondary | ICD-10-CM

## 2019-11-28 LAB — BASIC METABOLIC PANEL
Anion gap: 10 (ref 5–15)
BUN: 6 mg/dL (ref 6–20)
CO2: 26 mmol/L (ref 22–32)
Calcium: 8.7 mg/dL — ABNORMAL LOW (ref 8.9–10.3)
Chloride: 101 mmol/L (ref 98–111)
Creatinine, Ser: 0.9 mg/dL (ref 0.44–1.00)
GFR calc Af Amer: >60 mL/min (ref >60)
GFR calc non Af Amer: >60 mL/min (ref >60)
Glucose, Bld: 99 mg/dL (ref 70–99)
Potassium: 3.5 mmol/L (ref 3.5–5.1)
Sodium: 137 mmol/L (ref 135–145)

## 2019-11-28 LAB — SEDIMENTATION RATE: Sed Rate: 18 mm/hr (ref 0–22)

## 2019-11-28 LAB — TROPONIN I (HIGH SENSITIVITY): Troponin I (High Sensitivity): 4 ng/L (ref <18)

## 2019-11-28 LAB — C-REACTIVE PROTEIN: CRP: 2.3 mg/dL — ABNORMAL HIGH (ref <1.0)

## 2019-11-28 LAB — MAGNESIUM: Magnesium: 2.4 mg/dL (ref 1.7–2.4)

## 2019-11-28 MED ORDER — CARVEDILOL 12.5 MG PO TABS
18.7500 mg | ORAL_TABLET | Freq: Two times a day (BID) | ORAL | 6 refills | Status: DC
Start: 2019-11-28 — End: 2019-11-28

## 2019-11-28 MED ORDER — AMIODARONE HCL 200 MG PO TABS: 200.0000 mg | ORAL_TABLET | Freq: Two times a day (BID) | ORAL | 5 refills | Status: DC

## 2019-11-28 MED ORDER — MAGNESIUM SULFATE 2 GM/50ML IV SOLN
INTRAVENOUS | Status: AC
  Filled 2019-11-28: qty 50

## 2019-11-28 MED ORDER — DIGOXIN 125 MCG PO TABS
125.0000 ug | ORAL_TABLET | Freq: Every day | ORAL | 11 refills | Status: DC
Start: 2019-11-28 — End: 2019-11-28

## 2019-11-28 MED ORDER — CARVEDILOL 12.5 MG PO TABS
18.7500 mg | ORAL_TABLET | Freq: Two times a day (BID) | ORAL | 6 refills | Status: DC
Start: 2019-11-28 — End: 2020-05-30

## 2019-11-28 MED ORDER — GUAIFENESIN-DM 100-10 MG/5ML PO SYRP: 5.0000 mL | ORAL_SOLUTION | ORAL | Status: DC | PRN

## 2019-11-28 MED FILL — CARVEDILOL 12.5 MG TABLET: 12.5 | 30 days supply | Qty: 90 | Fill #0

## 2019-11-28 MED FILL — AMIODARONE HCL 200 MG TAB: 200 | 22 days supply | Qty: 45 | Fill #0

## 2019-11-28 NOTE — Discharge Summary (Signed)
Advanced Heart Failure Team  Discharge Summary   Patient ID: [name] MRN: [id number], DOB/AGE: 10/04/1975 44 y.o. Admit date: 11/27/2019 D/C date:     11/28/2019   Primary Discharge Diagnoses:  1. A/C Systolic HF, NICM peripartum  2.  Chest pain  3. Frequent PVC 4. Hs of OSA 5. Morbid obesity 6. Asthma 7. Anxiety  8. DMII   Hospital Course: [name] a 44 year old female with h/ohypertension, morbid obesity s/p gastric sleeve 08/2017, diabetes mellitus, IBS, depression, anxiety, OSA on CPAP until June 2019, DVT not on anticoagulants, chronic combined systolic and diastolic CHF thought to be due to peri-partum CM with onset in 1999 , Tubal ligation, and s/p [employer] ICD.   Most recent ECHO < 20% on 11/13/19.   Followed closely in the HF clinic and was seen on 11/23/19. At that time she was stable but having frequent PVCs and daytime fatigue. She was set up for outpatient sleep study and had Zio Patch placed. CPX was also ordered.   Admitted on 11/27/19 with chest pain and shortness of breath. HS Trop negative. No EKG changes. CXR was ok.  From HF perspective she was stable. Amiodarone drip was started and carvedilol was increased. PVC burden less frequent. Discharging on amio 200 mg twice a day x 7 days and increase carvedilol 18.75 mg twice a day.   She has follow up with EP. She will continue on with outpatient sleep study and return Zio Patch to quantify PVC burden.   Plan to follow closely in the HF clinic.   Discharge Vitals: Blood pressure 97/71, pulse 68, temperature 98.7 F (37.1 C), temperature source Oral, resp. rate 18, height 5\' 6"  (1.676 m), weight 100.8 kg, last menstrual period 11/19/2019, SpO2 97 %.  Labs: Lab Results  Component Value Date   WBC 8.6 11/27/2019   HGB 9.0 (L) 11/27/2019   HCT 30.6 (L) 11/27/2019   MCV 78.5 (L) 11/27/2019   PLT 237 11/27/2019    Recent Labs  Lab 11/28/19 0636  NA 137  K 3.5  CL 101  CO2 26  BUN 6  CREATININE  0.90  CALCIUM 8.7*  GLUCOSE 99   Lab Results  Component Value Date   CHOL 160 11/09/2015   HDL 58 11/09/2015   LDLCALC 88 11/09/2015   TRIG 70 11/09/2015   BNP (last 3 results) Recent Labs    11/12/19 1812 11/27/19 0737  BNP 754.6* 806.2*    ProBNP (last 3 results) No results for input(s): PROBNP in the last 8760 hours.   Diagnostic Studies/Procedures   DG Chest 2 View  Result Date: 11/27/2019 CLINICAL DATA:  44 year old female with history of shortness of breath since yesterday evening. EXAM: CHEST - 2 VIEW COMPARISON:  Chest x-ray 10/25/2019. FINDINGS: Lung volumes are normal. No consolidative airspace disease. No pleural effusions. No pneumothorax. No pulmonary nodule or mass noted. Pulmonary vasculature and the cardiomediastinal silhouette are within normal limits. Left-sided pacemaker/AICD with lead tip projecting over the expected location of the right ventricle. Surgical clips project over the right upper quadrant of the abdomen, likely from prior cholecystectomy. IMPRESSION: No radiographic evidence of acute cardiopulmonary disease. Electronically Signed   By: Vinnie Langton M.D.   On: 11/27/2019 07:36    Discharge Medications   Allergies as of 11/28/2019      Reactions   Vancomycin Other (See Comments)   "did something to my kidneys," PROGRESSED TO KIDNEY FAILURE!!   Aspirin Other (See Comments)   Wheezing, (Pt  states that she just wheezes some when she takes aspirin by itself but she can take aspirin in a combination product).   Contrast Media [iodinated Diagnostic Agents] Other (See Comments)   Multiple CT contrast studies done over 2 weeks caused ARF   Avocado Itching, Swelling   Ciprofloxacin Itching, Rash   Farxiga [dapagliflozin] Rash   Sulfa Antibiotics Itching, Rash      Medication List    TAKE these medications   albuterol 108 (90 Base) MCG/ACT inhaler Commonly known as: ProAir HFA Inhale 1-2 puffs into the lungs every 6 (six) hours as needed for  wheezing or shortness of breath.   amiodarone 200 MG tablet Commonly known as: PACERONE Take 1 tablet (200 mg total) by mouth 2 (two) times daily. What changed: when to take this   butalbital-acetaminophen-caffeine 50-325-40 MG tablet Commonly known as: FIORICET TAKE 1 TO 2 TABLETS BY MOUTH EVERY 6 HOURS AS NEEDED FOR HEADACHE What changed: See the new instructions.   carvedilol 12.5 MG tablet Commonly known as: Coreg Take 1.5 tablets (18.75 mg total) by mouth 2 (two) times daily. What changed: how much to take   colestipol 1 g tablet Commonly known as: COLESTID Take 2 tablets (2 g total) by mouth 2 (two) times daily. What changed:   when to take this  reasons to take this   diclofenac sodium 1 % Gel Commonly known as: VOLTAREN Apply 4 g topically 4 (four) times daily. What changed:   when to take this  reasons to take this   dicyclomine 20 MG tablet Commonly known as: BENTYL Take 20 mg by mouth daily as needed for spasms.   fluticasone 50 MCG/ACT nasal spray Commonly known as: FLONASE Place 2 sprays into both nostrils daily as needed for allergies.   gabapentin 100 MG capsule Commonly known as: NEURONTIN Take 100 mg by mouth 2 (two) times daily as needed (back spasms during the day).   gabapentin 300 MG capsule Commonly known as: NEURONTIN Take 300 mg by mouth at bedtime as needed (back spasms).   ipratropium-albuterol 0.5-2.5 (3) MG/3ML Soln Commonly known as: DUONEB Take 3 mLs by nebulization every 6 (six) hours as needed.   ivabradine 7.5 MG Tabs tablet Commonly known as: Corlanor TAKE 1 TABLET (7.5 MG TOTAL) BY MOUTH 2 (TWO) TIMES DAILY WITH A MEAL. What changed:   how much to take  how to take this  when to take this  additional instructions   lidocaine 5 % Commonly known as: Lidoderm Place 1 patch onto the skin daily. Remove & Discard patch within 12 hours or as directed by MD What changed:   when to take this  reasons to take this    Magnesium Oxide 200 MG Tabs Take 1 tablet (200 mg total) by mouth daily.   montelukast 10 MG tablet Commonly known as: SINGULAIR Take 1 tablet (10 mg total) by mouth at bedtime.   MULTIVITAMIN GUMMIES WOMENS PO Take 2 tablets by mouth at bedtime.   nitroGLYCERIN 0.4 MG SL tablet Commonly known as: NITROSTAT Place 1 tablet (0.4 mg total) under the tongue every 5 (five) minutes as needed for chest pain.   omeprazole 20 MG capsule Commonly known as: PRILOSEC Take 20 mg by mouth daily as needed (for reflux symptoms).   potassium chloride 10 MEQ tablet Commonly known as: KLOR-CON Take 20 mEq by mouth 2 (two) times daily.   sacubitril-valsartan 97-103 MG Commonly known as: ENTRESTO Take 1 tablet by mouth 2 (two) times daily.  Symbicort 160-4.5 MCG/ACT inhaler Generic drug: budesonide-formoterol INHALE 2 PUFFS INTO THE LUNGS 2 (TWO) TIMES DAILY AS NEEDED. What changed: See the new instructions.   tiZANidine 4 MG tablet Commonly known as: ZANAFLEX Take 4 mg by mouth every 8 (eight) hours as needed for muscle spasms.   torsemide 20 MG tablet Commonly known as: DEMADEX Take 1 tablet (20 mg total) by mouth daily.   Ubrelvy 50 MG Tabs Generic drug: Ubrogepant Take 50 mg by mouth every 2 (two) hours as needed (migraine). What changed:   when to take this  additional instructions   Vitamin D (Ergocalciferol) 1.25 MG (50000 UNIT) Caps capsule Commonly known as: DRISDOL Take 1 capsule (50,000 Units total) by mouth every 7 (seven) days. What changed: when to take this       Disposition   The patient will be discharged in stable condition to home. Discharge Instructions    (HEART FAILURE PATIENTS) Call MD:  Anytime you have any of the following symptoms: 1) 3 pound weight gain in 24 hours or 5 pounds in 1 week 2) shortness of breath, with or without a dry hacking cough 3) swelling in the hands, feet or stomach 4) if you have to sleep on extra pillows at night in order to  breathe.   Complete by: As directed    Diet - low sodium heart healthy   Complete by: As directed    Heart Failure patients record your daily weight using the same scale at the same time of day   Complete by: As directed    Increase activity slowly   Complete by: As directed       Follow-up Information    [facility] Follow up on 12/05/2019.   Specialty: Cardiology Why: [id number]  Contact information: [address] [phone] Danice Goltz [place]       Evans Lance, MD Follow up on 12/27/2019.   Specialty: Cardiology Why: at 1130 for follow up Contact information: [address] [place] [address] [phone]                 Duration of Discharge Encounter: Greater than 35 minutes   Signed, Darrick Grinder NP-C  11/28/2019, 12:11 PM

## 2019-11-28 NOTE — Patient Outreach (Signed)
  [facility]) Care Management Chronic Special Needs Program    11/28/2019  Name: [name], DOB: 1975/10/12  MRN: [id number]   [name] is enrolled in a chronic special needs plan for Heart Failure. Per chart-Recent admission 11/12/19-11/17/19- atypical chest pain, acute exacerbation of systolic congestive heart failure, non sustained Ventricular tachycardia. Per chart follow up visit at advanced heart failure clinic completed on 11/23/2019. Client presented to the hospital on 11/27/2019 with report of chest pain and admitted with acute systolic and diastolic heart failure with history of chronic Heart failure. Care plan sent to [facility] utilization management team.   Plan-RNCM will continue to follow. Care coordinate with [facility] hospital liaison and inpatient CM as indicated. RNCM will follow up with client post hospitalization.  Thea Silversmith, RN, MSN, [place] [phone]

## 2019-11-28 NOTE — Consult Note (Signed)
Advanced Heart Failure Team Consult Note   Primary Physician: Hoyt Koch, MD PCP-Cardiologist:  Sinclair Grooms, MD  Reason for Consultation: Heart Failure  HPI:    [name] is seen today for evaluation of Heart failure  at the request of Dr. Domenic Polite.   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   Was admitted 12/5-11/2016 with recurrent HF, URI, CP and elevated BP (160/110). CT chest no PE. Diuresed from 233->228 pounds.   Admitted 11/2017 with CP described as "pounding". Troponins were negative. Symptoms thought to be related to frequent PVCs. EP consulted and scheduled her for PVC ablation. Echo repeated 11/22/17 and showed improved EF 50-55%. HF team consulted and changed torsemide to 40 mg daily (from 3x/week).   Underwent PVC ablation 12/01/17. Was felt not to be successful. Was on flecainide but discontinued due to reduced EF. Now on amiodarone.   Repeat Echo 10/2018 showed drop in EF back down to 25-30%. Was instructed to f/u in clinic but pt did not. She had outpatient monitor 04/2018 that showed rare PVCs.   In ED on 04/12/19 w/ CC of chest pain. According to EDP documentation, pain had been present for 3-6 hrs prior to presenting to the ED. Described as central-left sided heaviness/pressure/tightness that was worse with exertion. No pleuritic pain or hypoxia to suggest acute PE. Hs Troponin normal x 2 (4>>5). EKG showed sinus tach 104 w/ frequent PVCs. Device interrogation was unremarkable. She was released from the ED and instructed to f/u in clinic.  Saw EP on 12/11 with suggestion for bb again.   seen in CHF clinic on 05/11/2019, doing ok at the  time except for more palpitations. more palpitations,Zio Patch 04/2019 > 17% PVCs and >8% couplets, amio 200 mg daily was continued. Mild SOB with steps.No PND/Orthopnea.  Weight at home 200-206pounds.   [employer] Score 35: Worsening S3, thoracic Impedance, night heart rate, and respiratory rate.  11/12/2019 - she presented to the ER with cough, dyspnea, midsternal chest pain.  No ICD shocks.  No syncope. Meds adjusted. F/u HF clinic.  11/27/2019 presented to ER with dry non-productive cough, mid-sternal chest pain that did not radiate . Patient states that yesterday she became acutely short of breath and felt like there was a tickle in her throat. Patient states that the symptoms only occur when she coughs. States that her work of breathing is otherwise at his baseline. Denies any fever or chills.  Has had increased dyspnea on exertion.  States that the symptoms are similar to her prior heart failure exacerbations.   -Hs Troponin 5, 4  -Na 137, K+ 3.4, Cr 0.84  -BNP 806 up from 754 in June -Hgb 9.0 and WBC 8.5 plts 252  -2V CXR  IMPRESSION: No radiographic evidence of acute cardiopulmonary disease. -EKG SR with PVCs bigeminy and 3 beast NSVT, but looking at old EKGs no changes and no acute ST changes.   -Amiodarone load followed by infusion   Echo 11/13/2019: Left ventricular ejection fraction, by estimation, is <20%. There is no left ventricular hypertrophy. The right ventricular size is normal  RHC: 10/2019 RA = 4 RV = 32/6 PA = 31/8 (21) PCW =  12 Fick cardiac output/index = 7.0/3.4 Thermo CO/CI = 5.2/2.5 PVR =1.7 (Thermo) Ao sat = 98% PA sat = 67%, 69%  Review of Systems: [y] = yes, [ ]  = no   . General: Weight gain [ ] ; Weight loss [ ] ; Anorexia [ ] ; Fatigue [ ] ; Fever [ ] ; Chills [ ] ; Weakness [ ]   . Cardiac: Chest pain/pressure [ Y]; Resting SOB [ ] ; Exertional SOB [ Y]; Orthopnea [ ] ; Pedal Edema [ ] ; Palpitations [ ] ; Syncope [ ] ; Presyncope [ ] ; Paroxysmal  nocturnal dyspnea[ ]   . Pulmonary: Cough [ Y; Wheezing[ ] ; Hemoptysis[ ] ; Sputum [ ] ; Snoring [ ]   . GI: Vomiting[ ] ; Dysphagia[ ] ; Melena[ ] ; Hematochezia [ ] ; Heartburn[ ] ; Abdominal pain [ ] ; Constipation [ ] ; Diarrhea [ ] ; BRBPR [ ]   . GU: Hematuria[ ] ; Dysuria [ ] ; Nocturia[ ]   . Vascular: Pain in legs with walking [ ] ; Pain in feet with lying flat [ ] ; Non-healing sores [ ] ; Stroke [ ] ; TIA [ ] ; Slurred speech [ ] ;  . Neuro: Headaches[ ] ; Vertigo[ ] ; Seizures[ ] ; Paresthesias[ ] ;Blurred vision [ ] ; Diplopia [ ] ; Vision changes [ ]   . Ortho/Skin: Arthritis [ ] ; Joint pain [ ] ; Muscle pain [ ] ; Joint swelling [ ] ; Back Pain [ ] ; Rash [ ]   . Psych: Depression[ ] ; Anxiety[ Y]  . Heme: Bleeding problems [ ] ; Clotting disorders [ ] ; Anemia [ ]   . Endocrine: Diabetes [ ] ; Thyroid dysfunction[ ]   Home Medications Prior to Admission medications   Medication Sig Start Date End Date Taking? Authorizing Provider  albuterol (PROAIR HFA) 108 (90 Base) MCG/ACT inhaler Inhale 1-2 puffs into the lungs every 6 (six) hours as needed for wheezing or shortness of breath. 08/17/19  Yes Biagio Borg, MD  amiodarone (PACERONE) 200 MG tablet Take 1 tablet by mouth once daily Patient taking differently: Take 200 mg by mouth daily with lunch.  02/09/19  Yes Larey Dresser, MD  butalbital-acetaminophen-caffeine (FIORICET) 50-325-40 MG tablet TAKE 1 TO 2 TABLETS BY MOUTH EVERY 6 HOURS AS NEEDED FOR HEADACHE Patient taking differently: Take 1-2 tablets by mouth every 6 (six) hours as needed for headache or migraine.  12/05/18  Yes Hoyt Koch, MD  carvedilol (COREG) 12.5 MG tablet Take 1 tablet (12.5 mg total) by mouth 2 (two) times daily. 11/17/19 12/17/19 Yes Dahal, Marlowe Aschoff, MD  colestipol (COLESTID) 1 g tablet Take 2 tablets (2 g total) by mouth 2 (two) times daily. Patient taking differently: Take 2 g by mouth daily as needed (IBS).  02/10/19  Yes Hoyt Koch, MD  diclofenac sodium (VOLTAREN) 1 %  GEL Apply 4 g topically 4 (four) times daily. Patient taking differently: Apply 4 g topically 4 (four) times daily as needed (pain).  06/23/18  Yes Hoyt Koch, MD  dicyclomine (BENTYL) 20 MG tablet Take 20 mg by mouth daily as needed for spasms.    Yes [provider]  fluticasone (FLONASE) 50 MCG/ACT nasal spray Place 2 sprays into both nostrils daily as needed for allergies. 07/25/18  Yes Hoyt Koch, MD  gabapentin (NEURONTIN) 100 MG capsule Take 100 mg by mouth 2 (two) times daily as needed (back spasms during the day).    Yes [provider]  gabapentin (NEURONTIN) 300 MG capsule Take 300 mg by mouth at bedtime as needed (back spasms).   Yes [provider]  ipratropium-albuterol (DUONEB) 0.5-2.5 (3) MG/3ML SOLN Take 3 mLs by nebulization every  6 (six) hours as needed. 11/17/19 12/17/19 Yes Dahal, Marlowe Aschoff, MD  ivabradine (CORLANOR) 7.5 MG TABS tablet TAKE 1 TABLET (7.5 MG TOTAL) BY MOUTH 2 (TWO) TIMES DAILY WITH A MEAL. Patient taking differently: Take 7.5 mg by mouth daily with lunch.  02/15/19  Yes Ondria Oswald, Shaune Pascal, MD  lidocaine (LIDODERM) 5 % Place 1 patch onto the skin daily. Remove & Discard patch within 12 hours or as directed by MD Patient taking differently: Place 1 patch onto the skin 2 (two) times daily as needed (pain). Remove & Discard patch within 12 hours or as directed by MD 01/17/18  Yes Hoyt Koch, MD  Magnesium Oxide 200 MG TABS Take 1 tablet (200 mg total) by mouth daily. 11/23/19  Yes Clegg, Amy D, NP  montelukast (SINGULAIR) 10 MG tablet Take 1 tablet (10 mg total) by mouth at bedtime. 09/13/19  Yes Fenton Foy, NP  Multiple Vitamins-Minerals (MULTIVITAMIN GUMMIES WOMENS PO) Take 2 tablets by mouth at bedtime.    Yes [provider]  nitroGLYCERIN (NITROSTAT) 0.4 MG SL tablet Place 1 tablet (0.4 mg total) under the tongue every 5 (five) minutes as needed for chest pain. 09/05/19  Yes Jazzie Trampe, Shaune Pascal, MD    omeprazole (PRILOSEC) 20 MG capsule Take 20 mg by mouth daily as needed (for reflux symptoms).  09/09/17 11/27/19 Yes [provider]  potassium chloride (KLOR-CON) 10 MEQ tablet Take 20 mEq by mouth 2 (two) times daily. 10/31/19  Yes [provider]  sacubitril-valsartan (ENTRESTO) 97-103 MG Take 1 tablet by mouth 2 (two) times daily. 09/05/19  Yes Eleah Lahaie, Shaune Pascal, MD  SYMBICORT 160-4.5 MCG/ACT inhaler INHALE 2 PUFFS INTO THE LUNGS 2 (TWO) TIMES DAILY AS NEEDED. Patient taking differently: Inhale 2 puffs into the lungs 2 (two) times daily as needed (shortness of breath/wheezing).  05/03/18  Yes Lyndal Pulley, DO  tiZANidine (ZANAFLEX) 4 MG tablet Take 4 mg by mouth every 8 (eight) hours as needed for muscle spasms.  12/02/18  Yes [provider]  torsemide (DEMADEX) 20 MG tablet Take 1 tablet (20 mg total) by mouth daily. 11/18/19 12/18/19 Yes Dahal, Marlowe Aschoff, MD  Ubrogepant (UBRELVY) 50 MG TABS Take 50 mg by mouth every 2 (two) hours as needed (migraine). Patient taking differently: Take 50 mg by mouth See admin instructions. Take one tablet (50 mg) by mouth at onset of migraine headache, may repeat in 30 minutes if still needed. 10/17/19  Yes Hoyt Koch, MD  Vitamin D, Ergocalciferol, (DRISDOL) 1.25 MG (50000 UT) CAPS capsule Take 1 capsule (50,000 Units total) by mouth every 7 (seven) days. Patient taking differently: Take 50,000 Units by mouth every Wednesday.  08/01/18  Yes Lyndal Pulley, DO    Past Medical History: Past Medical History:  Diagnosis Date  . Acute on chronic systolic (congestive) heart failure ([place]) 04/29/2017  . Acute pain of right shoulder 06/16/2017  . AICD (automatic cardioverter/defibrillator) present   . Anxiety   . Arthritis    right shoulder   . Asthma   . CHF (congestive heart failure) ([place])   . Chronic combined systolic and diastolic heart failure ([place]) 03/05/2014  . Closed low lateral malleolus fracture 10/23/2013  . Cystitis  10/21/2017  . Depression   . Depression with anxiety 01/20/2013  . Diabetes mellitus without complication ([place])   . Diverticulosis   . Dyspnea    comes and goes intermittently mostly with exertion   . Essential hypertension    Prev followed by [facility]/ Cardiology   . Fibroid    age 11  . Gallstones   . Generalized abdominal cramping   . History of cardiomyopathy   . Hypertension   . IBS (irritable bowel syndrome)   . 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 Insomnia 05/07/2017  . Labral tear of shoulder 04/04/2015    Injected 04/04/2015 Injected 12/03/2015   . Migraine    "monthly" (08/03/2016)  . Myofascial pain 06/16/2017  . NICM (nonischemic cardiomyopathy) ([place]) 08/03/2016  . Nonallopathic lesion of lumbosacral region 11/16/2016  . Nonallopathic lesion of sacral region 11/16/2016  . Nonallopathic lesion of thoracic region 08/20/2014  . Nonspecific chest pain 04/28/2017  . OSA (obstructive sleep apnea) 01/02/2013   NPSG 2009:  AHI 9/hr. CPAP intolerance >> "smothering" Good tolerance of auto device (optimal pressure 12-13 on download).  - referred to Dr Gwenette Greet    . OSA on CPAP   . Ovarian cyst    1999; surgically removed  . Patellofemoral syndrome of both knees 10/16/2016  . 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 PVC (premature ventricular contraction) 06/23/2016  . Seizures ([place])    "as a child" (08/03/2016)  . Termination of pregnancy    due to cardiac risk    Past Surgical History: Past Surgical History:  Procedure Laterality Date  . CARDIAC CATHETERIZATION N/A 11/11/2015   Procedure: Right/Left Heart Cath and Coronary Angiography;  Surgeon: Troy Sine, MD;  Location: [place] [facility];  Service: Cardiovascular;  Laterality: N/A;  . CARDIAC CATHETERIZATION  ~ 2015  . CARDIAC DEFIBRILLATOR PLACEMENT  08/03/2016  . CESAREAN SECTION  1999  . COLONOSCOPY WITH PROPOFOL N/A 04/21/2016   Procedure: COLONOSCOPY WITH  PROPOFOL;  Surgeon: Jerene Bears, MD;  Location: [facility];  Service: Gastroenterology;  Laterality: N/A;  . ESOPHAGOGASTRODUODENOSCOPY (EGD) WITH PROPOFOL N/A 04/21/2016   Procedure: ESOPHAGOGASTRODUODENOSCOPY (EGD) WITH PROPOFOL;  Surgeon: Jerene Bears, MD;  Location: [facility];  Service: Gastroenterology;  Laterality: N/A;  . FOOT FRACTURE SURGERY Right ~ 2003  . FRACTURE SURGERY    . ICD IMPLANT N/A 08/03/2016   Procedure: ICD Implant;  Surgeon: Deboraha Sprang, MD;  Location: [place];  Service: Cardiovascular;  Laterality: N/A;  . LAPAROSCOPIC CHOLECYSTECTOMY  12/2006  . LAPAROSCOPIC GASTRIC SLEEVE RESECTION    . LAPAROSCOPY ABDOMEN DIAGNOSTIC  2008   "cut bile duct w/gallbladder OR; had to go in later & fix leak; hospitalized for 2 months"  . LEFT HEART CATHETERIZATION WITH CORONARY ANGIOGRAM N/A 02/26/2014   Procedure: LEFT HEART CATHETERIZATION WITH CORONARY ANGIOGRAM;  Surgeon: Jettie Booze, MD;  Location: [facility];  Service: Cardiovascular;  Laterality: N/A;  . OVARIAN CYST REMOVAL Right 1999  . PVC ABLATION N/A 12/01/2017   Procedure: PVC ABLATION;  Surgeon: Evans Lance, MD;  Location: [place] [facility];  Service: Cardiovascular;  Laterality: N/A;  . RIGHT HEART CATH N/A 08/05/2017   Procedure: RIGHT HEART CATH;  Surgeon: Jolaine Artist, MD;  Location: [place];  Service: Cardiovascular;  Laterality: N/A;  . RIGHT HEART CATH N/A 11/16/2019   Procedure: RIGHT HEART CATH;  Surgeon: Jolaine Artist, MD;  Location: [place] [facility];  Service: Cardiovascular;  Laterality: N/A;  . TUBAL LIGATION  1999    Family History: Family History  Problem Relation Age of Onset  . Emphysema Maternal Grandmother        smoked  . Heart disease Maternal Grandmother 56  MI  . Rheum arthritis Mother   . Allergies Daughter   . Colon cancer Neg Hx     Social History: Social History   Socioeconomic History  . Marital status: Married     Spouse name: Not on file  . Number of children: 1  . Years of education: Not on file  . Highest education level: Not on file  Occupational History  . Occupation: stay at home mom  Tobacco Use  . Smoking status: Never Smoker  . Smokeless tobacco: Never Used  Vaping Use  . Vaping Use: Never used  Substance and Sexual Activity  . Alcohol use: No  . Drug use: No  . Sexual activity: Yes  Other Topics Concern  . Not on file  Social History Narrative  . Not on file   Social Determinants of Health   Financial Resource Strain:   . Difficulty of Paying Living Expenses:   Food Insecurity: No Food Insecurity  . Worried About Charity fundraiser in the Last Year: Never true  . Ran Out of Food in the Last Year: Never true  Transportation Needs: No Transportation Needs  . Lack of Transportation (Medical): No  . Lack of Transportation (Non-Medical): No  Physical Activity:   . Days of Exercise per Week:   . Minutes of Exercise per Session:   Stress:   . Feeling of Stress :   Social Connections:   . Frequency of Communication with Friends and Family:   . Frequency of Social Gatherings with Friends and Family:   . Attends Religious Services:   . Active Member of Clubs or Organizations:   . Attends Archivist Meetings:   Marland Kitchen Marital Status:     Allergies:  Allergies  Allergen Reactions  . Vancomycin Other (See Comments)    "did something to my kidneys," PROGRESSED TO KIDNEY FAILURE!!  Marland Kitchen Aspirin Other (See Comments)    Wheezing, (Pt states that she just wheezes some when she takes aspirin by itself but she can take aspirin in a combination product).  . Contrast Media [Iodinated Diagnostic Agents] Other (See Comments)    Multiple CT contrast studies done over 2 weeks caused ARF  . Avocado Itching and Swelling  . Ciprofloxacin Itching and Rash  . Farxiga [Dapagliflozin] Rash  . Sulfa Antibiotics Itching and Rash    Objective:    Vital Signs:   Temp:  [97.9 F (36.6  C)-98.2 F (36.8 C)] 97.9 F (36.6 C) (07/06 0746) Pulse Rate:  [62-100] 65 (07/06 0746) Resp:  [15-31] 15 (07/06 0746) BP: (108-145)/(63-106) 108/63 (07/06 0746) SpO2:  [97 %-99 %] 99 % (07/06 0803) Weight:  [100.8 kg] 100.8 kg (07/06 0500) Last BM Date: 11/27/19  Weight change: Filed Weights   11/27/19 0801 11/28/19 0029 11/28/19 0500  Weight: 99.3 kg 100.8 kg 100.8 kg    Intake/Output:   Intake/Output Summary (Last 24 hours) at 11/28/2019 1031 Last data filed at 11/28/2019 0800 Gross per 24 hour  Intake 734.63 ml  Output 1475 ml  Net -740.37 ml      Physical Exam    General:  Well appearing. No resp difficulty HEENT: normal Neck: supple. JVP . Carotids 2+ bilat; no bruits. No lymphadenopathy or thyromegaly appreciated. Cor: PMI nondisplaced. Regular rate & rhythm. Frequent PVC's . No rubs, gallops or murmurs. Lungs: clear Abdomen: soft, nontender, nondistended. No hepatosplenomegaly. No bruits or masses. Good bowel sounds. Extremities: no cyanosis, clubbing, rash, edema Neuro: alert & orientedx3, cranial nerves grossly intact. moves  all 4 extremities w/o difficulty. Affect pleasant   Telemetry   NSR 70-s w/ frequent PVC's . Personally reviewed  EKG    11/27/2019: Normal sinus rhythm, Possible old anteroseptal MI   Labs   Basic Metabolic Panel: Recent Labs  Lab 11/23/19 1433 11/27/19 0645 11/27/19 0907 11/27/19 1548 11/28/19 0636  NA 138 137  --   --  137  K 4.1 3.4*  --   --  3.5  CL 105 107  --   --  101  CO2 26 21*  --   --  26  GLUCOSE 99 100*  --   --  99  BUN 10 9  --   --  6  CREATININE 0.84 0.84  --  0.79 0.90  CALCIUM 9.0 8.7*  --   --  8.7*  MG 1.9  --  1.5*  --  2.4    Liver Function Tests: No results for input(s): AST, ALT, ALKPHOS, BILITOT, PROT, ALBUMIN in the last 168 hours. No results for input(s): LIPASE, AMYLASE in the last 168 hours. No results for input(s): AMMONIA in the last 168 hours.  CBC: Recent Labs  Lab 11/27/19 0645  11/27/19 1548  WBC 8.5 8.6  HGB 9.0* 9.0*  HCT 30.6* 30.6*  MCV 78.5* 78.5*  PLT 252 237    Cardiac Enzymes: No results for input(s): CKTOTAL, CKMB, CKMBINDEX, TROPONINI in the last 168 hours.  BNP: BNP (last 3 results) Recent Labs    11/12/19 1812 11/27/19 0737  BNP 754.6* 806.2*    ProBNP (last 3 results) No results for input(s): PROBNP in the last 8760 hours.   CBG: No results for input(s): GLUCAP in the last 168 hours.  Coagulation Studies: No results for input(s): LABPROT, INR in the last 72 hours.   Imaging   Lung volumes are normal. No consolidative airspace disease. No pleural effusions. No pneumothorax. No pulmonary nodule or mass noted. Pulmonary vasculature and the cardiomediastinal silhouette are within normal limits. Left-sided pacemaker/AICD with lead tip projecting over the expected location of the right ventricle. Surgical clips project over the right upper quadrant of the abdomen, likely from prior cholecystectomy.  IMPRESSION: No radiographic evidence of acute cardiopulmonary disease.   Medications:     Current Medications: . carvedilol  18.75 mg Oral BID WC  . heparin  5,000 Units Subcutaneous Q8H  . ivabradine  7.5 mg Oral BID WC  . mometasone-formoterol  2 puff Inhalation BID  . montelukast  10 mg Oral QHS  . pantoprazole  40 mg Oral Daily  . potassium chloride  20 mEq Oral BID  . sacubitril-valsartan  1 tablet Oral BID  . sodium chloride flush  3 mL Intravenous Once  . sodium chloride flush  3 mL Intravenous Q12H  . torsemide  20 mg Oral Daily     Infusions: . sodium chloride    . amiodarone 30 mg/hr (11/27/19 2340)       Patient Profile   [name] is a 44 y.o. female with a hx of HTN, morbid obesity, s/p gastric sleeve 08/2017,DM-2, IBS, depression anxiety, OSA on CPAP until 10/2017, DVT not on anticoagulation, combined combined HF thoguht to be due to peri-partum CM onset 1999, and has [place] who is being  seen for HF exacerbation in the presence of SOB, mild chest pain, and EKG changes (bigeminy PVC's and 3 second run NSVT).Chest x-ray without overt signs of volume overload however her BNP is elevated.   Assessment/Plan   1. A/C  Systolic HF, NICM peripartum  - Had LHC 2017 with normal coronaries. CPX 04/2017  VO2 14 but corrects to 23 for IBW -Has [employer] ICD. Heart Logic Score 31, impedance down, night time heart rate 104 . -Echo 11/13/2019: Left ventricular ejection fraction, by estimation, is <20%. There is no left ventricular hypertrophy. The right ventricular size is normal -RHC 10/2019 Well-compensated volume status and outputs -continue Ivabradine, entresto, and torsemide  -exercise test once stabilized   2.  Chest pain  -likely related to cough -BNP admission 806.2 --hstrop admission 5, current 4 -CRP admission 2.3, current lab in process  -sed rate in process   3. Frequent PVC - PVC ablation 11/2017 with Dr Lovena Le unsuccessful -Zio Patch 04/2019 > 17% PVCs and >8% couplets. -increase carvedilol 18.75 -continue amio infusion 30mg /ml -K>4, Mg >2  4. Hs of OSA -sleep study post discharge   5. Morbid obesity - hx gastric sleeve  -educated on healthy and HF diets   6. Asthma -controlled with inhalers, nebulizer, and Singulair    7. Anxiety  -hx of and current fear w/ frequent PVC's that ICD will shock her  - continue Ambien PRN at bedtime for sleep    Length of Stay: [place], RN  11/28/2019, 10:31 AM  Advanced Heart Failure Team Pager [phone] (M-F; 7a - 4p)  Please contact [facility] for night-coverage after hours (4p -7a ) and weekends on [email or web address]  Patient seen and examined with the above-signed Advanced Practice Provider and/or Housestaff. I personally reviewed laboratory data, imaging studies and relevant notes. I independently examined the patient and formulated the important aspects of the plan. I have edited the note to reflect  any of my changes or salient points. I have personally discussed the plan with the patient and/or family.  Patient well known to me from Clinic.  Admitted with increased palpitations and SOB. 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 S/p previous PVC ablation. Has been on amio 200 daily with Zio in 12/20 with 17% PVCs. Recent RHC ok.   Started on amo gtt and PVC burden here ~6/min (8-9%). Volume status ok.   General:  Lying in bed. No resp difficulty HEENT: normal Neck: supple. no JVD. Carotids 2+ bilat; no bruits. No lymphadenopathy or thryomegaly appreciated. Cor: PMI nondisplaced. Regular rate & rhythm. + ectopy No rubs, gallops or murmurs. Lungs: clear Abdomen: obese soft, nontender, nondistended. No hepatosplenomegaly. No bruits or masses. Good bowel sounds. Extremities: no cyanosis, clubbing, rash, edema Neuro: alert & orientedx3, cranial nerves grossly intact. moves all 4 extremities w/o difficulty. Affect pleasant   She looks stable from HF perspective. Recently wore another zio to rre-quantify PVCs burden (is mailing in today) and was setting up CPX. Have asked EP to weigh in on any changes to her AA regimen.  Likely can go home today with outpatient CPX test to further risk stratify for need for advanced therapies given chronically low EF.   Glori Bickers, MD  12:00 PM

## 2019-11-28 NOTE — Care Management CC44 (Signed)
Condition Code 44 Documentation Completed  Patient Details  Name: MARQUERITE FORSMAN MRN: [id number] Date of Birth: 01-Aug-1975   Condition Code 44 given:  Yes Patient signature on Condition Code 44 notice:  Yes Documentation of 2 MD's agreement:  Yes Code 44 added to claim:  Yes    Zenon Mayo, RN 11/28/2019, 12:43 PM

## 2019-11-28 NOTE — Progress Notes (Signed)
Patient had an uneventful night. She continues on an amiodarone drip.

## 2019-11-28 NOTE — Care Management Obs Status (Signed)
MEDICARE OBSERVATION STATUS NOTIFICATION   Patient Details  Name: [name] MRN: [id number] Date of Birth: 04-Apr-1976   Medicare Observation Status Notification Given:  Yes    Zenon Mayo, RN 11/28/2019, 12:43 PM

## 2019-11-28 NOTE — Plan of Care (Signed)
  Problem: Education: Goal: Ability to verbalize understanding of medication therapies will improve Outcome: Progressing   

## 2019-11-29 ENCOUNTER — Encounter (HOSPITAL_COMMUNITY): Payer: Self-pay

## 2019-11-29 ENCOUNTER — Telehealth: Payer: Self-pay | Admitting: *Deleted

## 2019-11-29 NOTE — Telephone Encounter (Signed)
Pt was on TCM report admitted on 11/27/19 with chest pain and shortness of breath. HS Trop negative. No EKG changes. CXR was ok. Pt D/C 11/28/19, and will follow-up w/ [place] Follow up on 12/05/2019.

## 2019-11-30 ENCOUNTER — Telehealth: Payer: Self-pay | Admitting: *Deleted

## 2019-11-30 ENCOUNTER — Ambulatory Visit (INDEPENDENT_AMBULATORY_CARE_PROVIDER_SITE_OTHER): Payer: HMO | Admitting: Internal Medicine

## 2019-11-30 ENCOUNTER — Other Ambulatory Visit: Payer: Self-pay

## 2019-11-30 ENCOUNTER — Encounter: Payer: Self-pay | Admitting: Internal Medicine

## 2019-11-30 ENCOUNTER — Telehealth: Payer: Self-pay | Admitting: Internal Medicine

## 2019-11-30 DIAGNOSIS — F418 Other specified anxiety disorders: Secondary | ICD-10-CM

## 2019-11-30 DIAGNOSIS — J45901 Unspecified asthma with (acute) exacerbation: Secondary | ICD-10-CM

## 2019-11-30 DIAGNOSIS — I5042 Chronic combined systolic (congestive) and diastolic (congestive) heart failure: Secondary | ICD-10-CM | POA: Diagnosis not present

## 2019-11-30 MED ORDER — PREDNISONE 20 MG PO TABS: 40.0000 mg | ORAL_TABLET | Freq: Every day | ORAL | 0 refills | Status: DC

## 2019-11-30 MED ORDER — ESCITALOPRAM OXALATE 10 MG PO TABS
10.0000 mg | ORAL_TABLET | Freq: Every day | ORAL | 3 refills | Status: DC
Start: 2019-11-30 — End: 2020-08-27

## 2019-11-30 MED ORDER — BENZONATATE 200 MG PO CAPS
200.0000 mg | ORAL_CAPSULE | Freq: Three times a day (TID) | ORAL | 0 refills | Status: DC | PRN
Start: 2019-11-30 — End: 2020-04-09

## 2019-11-30 NOTE — Telephone Encounter (Signed)
Pt return call back completed TCM call below.[name]  Transition Care Management Follow-up Telephone Call   Date discharged? 11/28/19   How have you been since you were released from the hospital? Pt states she is doing ok, but still having cough w/wheezing. The phlegm is white. Also having headaches. Denies fever   Do you understand why you were in the hospital? YES   Do you understand the discharge instructions? YES   Where were you discharged to? Home   Items Reviewed:  Medications reviewed: YES, she states they added Amiodarone 200 mg twice a day, and also increase carvedilol to 18.75 mg twice a day. Medication list already updated w/new med  Allergies reviewed: YES  Dietary changes reviewed: YES, heart healthy  Referrals reviewed: No referral, but has appt w/cardiology on 12/05/19   Functional Questionnaire:   Activities of Daily Living (ADLs):   She states she are independent in the following: ambulation, bathing and hygiene, feeding, continence, grooming, toileting and dressing States she doesn't require assistance   Any transportation issues/concerns?: NO   Any patient concerns? YES, ongoing cough sxs   Confirmed importance and date/time of follow-up visits scheduled YES, 11/30/19  Provider Appointment booked with Dr. Sharlet Salina  Confirmed with patient if condition begins to worsen call PCP or go to the ER.  Patient was given the office number and encouraged to call back with question or concerns.  : YES

## 2019-11-30 NOTE — Telephone Encounter (Signed)
Pt states that she will use the GoodRx at [place]. I called [facility] and with Good Rx it brought the medication down to $26.

## 2019-11-30 NOTE — Telephone Encounter (Signed)
New message:   Pt is calling and states the cough depressant that was just prescribed to her is $103 and she states she is unable to afford this. She asks if something else can be prescribed. Please advise.

## 2019-11-30 NOTE — Telephone Encounter (Signed)
Can we ask pharmacy if the 100 mg is cheaper? Otherwise this is available on goodrx at many other pharmacies for <10 dollars for the 100 mg so I am happy to send to another [facility] ([facility], [facility], [facility]) so she can get there.

## 2019-11-30 NOTE — Telephone Encounter (Signed)
Called pt concerning hosp f/u that's made for this afternoon. There was no answer LMOM RTC.Marland KitchenJohny [name]

## 2019-11-30 NOTE — Progress Notes (Signed)
   Subjective:   Patient ID: [name], female    DOB: 02/12/1976, 44 y.o.   MRN: [id number]  HPI The patient is a 44 YO female coming in for hospital follow up (in for SOB and chest pain, put on amiodarone drip for frequent PVCs, EF <20%, seen by heart failure team while in hospital). She has been doing poorly and struggling a lot with mental health given all the health changes recently. She is not able to do up stairs easily and gets tired after doing anything. She does not feel able to care for herself or family and this is impacting her mental health. Denies chest pains. Overall is doing mediocre.   PMH, [facility], social history reviewed and updated  Review of Systems  Constitutional: Positive for activity change, appetite change and fatigue.  HENT: Negative.   Eyes: Negative.   Respiratory: Negative for cough, chest tightness and shortness of breath.   Cardiovascular: Negative for chest pain, palpitations and leg swelling.  Gastrointestinal: Negative for abdominal distention, abdominal pain, constipation, diarrhea, nausea and vomiting.  Musculoskeletal: Negative.   Skin: Negative.   Neurological: Negative.   Psychiatric/Behavioral: Positive for decreased concentration, dysphoric mood and sleep disturbance. Negative for suicidal ideas. The patient is not nervous/anxious.     Objective:  Physical Exam Constitutional:      Appearance: She is well-developed.  HENT:     Head: Normocephalic and atraumatic.  Cardiovascular:     Rate and Rhythm: Normal rate and regular rhythm.  Pulmonary:     Effort: Pulmonary effort is normal. No respiratory distress.     Breath sounds: Normal breath sounds. No wheezing or rales.  Abdominal:     General: Bowel sounds are normal. There is no distension.     Palpations: Abdomen is soft.     Tenderness: There is no abdominal tenderness. There is no rebound.  Musculoskeletal:     Cervical back: Normal range of motion.  Skin:    General: Skin is warm  and dry.  Neurological:     Mental Status: She is alert and oriented to person, place, and time.     Coordination: Coordination normal.  Psychiatric:     Comments: Mood appropriate to conversation     Vitals:   11/30/19 1323  BP: 118/84  Pulse: 97  Temp: 98.3 F (36.8 C)  TempSrc: Oral  SpO2: 99%  Weight: 219 lb (99.3 kg)  Height: 5\' 6"  (1.676 m)    This visit occurred during the SARS-CoV-2 public health emergency.  Safety protocols were in place, including screening questions prior to the visit, additional usage of staff PPE, and extensive cleaning of exam room while observing appropriate contact time as indicated for disinfecting solutions.   Assessment & Plan:

## 2019-11-30 NOTE — Patient Instructions (Signed)
We have sent in the prednisone to take 2 pills daily for 1 week.  We have sent in the cough medicine tessalon perles to take up to 3 times a day.   We have sent in the lexapro to take 1 pill daily. Let us know in 2-4 weeks how this is doing.

## 2019-12-01 ENCOUNTER — Inpatient Hospital Stay: Payer: HMO | Admitting: Internal Medicine

## 2019-12-01 NOTE — Assessment & Plan Note (Signed)
Following with cardiology and still ongoing evaluation upcoming.

## 2019-12-01 NOTE — Assessment & Plan Note (Signed)
With current chronic cough and rx tessalon perles and prednisone to see if this can help her to stop coughing as much so she can have less SOB.

## 2019-12-01 NOTE — Assessment & Plan Note (Signed)
Rx lexapro to help with mood and she is struggling with adjustment. Advised to seek counseling as well but we talked about likely delay on that due to volumes.

## 2019-12-05 ENCOUNTER — Encounter (HOSPITAL_COMMUNITY): Payer: Self-pay

## 2019-12-05 ENCOUNTER — Other Ambulatory Visit: Payer: Self-pay

## 2019-12-05 ENCOUNTER — Telehealth (HOSPITAL_COMMUNITY): Payer: Self-pay | Admitting: Cardiology

## 2019-12-05 ENCOUNTER — Ambulatory Visit (HOSPITAL_COMMUNITY)
Admit: 2019-12-05 | Discharge: 2019-12-05 | Disposition: A | Discharge status: Home / Self Care | Payer: HMO | Attending: Cardiology | Admitting: Cardiology

## 2019-12-05 VITALS — BP 140/78 | HR 88 | Wt 215.4 lb

## 2019-12-05 DIAGNOSIS — Z9884 Bariatric surgery status: Secondary | ICD-10-CM | POA: Insufficient documentation

## 2019-12-05 DIAGNOSIS — Z95810 Presence of automatic (implantable) cardiac defibrillator: Secondary | ICD-10-CM | POA: Diagnosis not present

## 2019-12-05 DIAGNOSIS — Z882 Allergy status to sulfonamides status: Secondary | ICD-10-CM | POA: Insufficient documentation

## 2019-12-05 DIAGNOSIS — M19011 Primary osteoarthritis, right shoulder: Secondary | ICD-10-CM | POA: Diagnosis not present

## 2019-12-05 DIAGNOSIS — Z791 Long term (current) use of non-steroidal anti-inflammatories (NSAID): Secondary | ICD-10-CM | POA: Insufficient documentation

## 2019-12-05 DIAGNOSIS — K589 Irritable bowel syndrome without diarrhea: Secondary | ICD-10-CM | POA: Diagnosis not present

## 2019-12-05 DIAGNOSIS — Z886 Allergy status to analgesic agent status: Secondary | ICD-10-CM | POA: Diagnosis not present

## 2019-12-05 DIAGNOSIS — Z8679 Personal history of other diseases of the circulatory system: Secondary | ICD-10-CM | POA: Insufficient documentation

## 2019-12-05 DIAGNOSIS — E119 Type 2 diabetes mellitus without complications: Secondary | ICD-10-CM | POA: Insufficient documentation

## 2019-12-05 DIAGNOSIS — I493 Ventricular premature depolarization: Secondary | ICD-10-CM

## 2019-12-05 DIAGNOSIS — Z7951 Long term (current) use of inhaled steroids: Secondary | ICD-10-CM | POA: Diagnosis not present

## 2019-12-05 DIAGNOSIS — G4733 Obstructive sleep apnea (adult) (pediatric): Secondary | ICD-10-CM | POA: Diagnosis not present

## 2019-12-05 DIAGNOSIS — Z79899 Other long term (current) drug therapy: Secondary | ICD-10-CM | POA: Insufficient documentation

## 2019-12-05 DIAGNOSIS — Z6834 Body mass index (BMI) 34.0-34.9, adult: Secondary | ICD-10-CM | POA: Diagnosis not present

## 2019-12-05 DIAGNOSIS — I5042 Chronic combined systolic (congestive) and diastolic (congestive) heart failure: Secondary | ICD-10-CM

## 2019-12-05 DIAGNOSIS — I428 Other cardiomyopathies: Secondary | ICD-10-CM

## 2019-12-05 DIAGNOSIS — Z7952 Long term (current) use of systemic steroids: Secondary | ICD-10-CM | POA: Insufficient documentation

## 2019-12-05 DIAGNOSIS — F5101 Primary insomnia: Secondary | ICD-10-CM

## 2019-12-05 DIAGNOSIS — Z881 Allergy status to other antibiotic agents status: Secondary | ICD-10-CM | POA: Insufficient documentation

## 2019-12-05 DIAGNOSIS — F329 Major depressive disorder, single episode, unspecified: Secondary | ICD-10-CM | POA: Insufficient documentation

## 2019-12-05 DIAGNOSIS — F419 Anxiety disorder, unspecified: Secondary | ICD-10-CM | POA: Diagnosis not present

## 2019-12-05 DIAGNOSIS — I110 Hypertensive heart disease with heart failure: Secondary | ICD-10-CM | POA: Insufficient documentation

## 2019-12-05 DIAGNOSIS — J45909 Unspecified asthma, uncomplicated: Secondary | ICD-10-CM | POA: Diagnosis not present

## 2019-12-05 DIAGNOSIS — Z9851 Tubal ligation status: Secondary | ICD-10-CM | POA: Diagnosis not present

## 2019-12-05 DIAGNOSIS — Z8249 Family history of ischemic heart disease and other diseases of the circulatory system: Secondary | ICD-10-CM | POA: Insufficient documentation

## 2019-12-05 LAB — BASIC METABOLIC PANEL
Anion gap: 11 (ref 5–15)
BUN: 9 mg/dL (ref 6–20)
CO2: 25 mmol/L (ref 22–32)
Calcium: 9.1 mg/dL (ref 8.9–10.3)
Chloride: 101 mmol/L (ref 98–111)
Creatinine, Ser: 0.88 mg/dL (ref 0.44–1.00)
GFR calc Af Amer: >60 mL/min (ref >60)
GFR calc non Af Amer: >60 mL/min (ref >60)
Glucose, Bld: 99 mg/dL (ref 70–99)
Potassium: 3.6 mmol/L (ref 3.5–5.1)
Sodium: 137 mmol/L (ref 135–145)

## 2019-12-05 LAB — BRAIN NATRIURETIC PEPTIDE: B Natriuretic Peptide: 84.3 pg/mL (ref 0.0–100.0)

## 2019-12-05 MED ORDER — POTASSIUM CHLORIDE ER 10 MEQ PO CPCR: 40.0000 meq | ORAL_CAPSULE | Freq: Two times a day (BID) | ORAL | 1 refills | Status: DC

## 2019-12-05 NOTE — Progress Notes (Signed)
Advanced Heart Failure Clinic Note Date:  12/05/2019  ID:  [name], DOB July 03, 1975, MRN [id number]  PCP:  Hoyt Koch, MD  Cardiologist: Pernell Dupre, MD HF: Dr Haroldine Laws EP: Dr. Andre Lefort [name] is a 44 y.o. 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   Was admitted 12/5-11/2016 with recurrent HF, URI, CP and elevated BP (160/110). CT chest no PE. Diuresed from 233-> 228 pounds.   Admitted 11/2017 with CP described as "pounding". Troponins were negative. Symptoms thought to be related to frequent PVCs. EP consulted and scheduled her for PVC ablation. Echo repeated 11/22/17 and showed improved EF 50-55%. HF team consulted and changed torsemide to 40 mg daily (from 3x/week).   Underwent PVC ablation 12/01/17. Was felt not to be successful.  Was on flecainide but discontinued due to reduced EF. Now on amiodarone.   Repeat Echo 10/2018 showed drop in EF back down to 25-30%. Was instructed to f/u in clinic but pt did not. She had outpatient monitor 04/2018 that showed rare PVCs.    She recently presented to the ED 11/18 w/ CC of chest pain. According to EDP documentation, pain had been present for 3-6 hrs prior to presenting to the ED. Described as central-left sided heaviness/pressure/tightness that was worse with exertion. No pleuritic pain or hypoxia to suggest acute PE. Hs Troponin normal x 2 (4>>5). EKG showed sinus tach 104 w/ frequent PVCs. Device interrogation was unremarkable. She was released from the ED and instructed to f/u in clinic.   Saw EP on 12/11 with suggestion for bb again.   Admitted 10/2019 with chest pain and shortness of breath. Viral panel negative. ECHO completed and showed Ef < 20% and  normal RV. Had cath with preserved cardiac output. Plan was to f/u with EP regarding PVCs.   Readmitted 7/5-11/28/19 with increase shortness of breath.  HS Trop negative.  From HF perspective she was stable. Amiodarone drip was started and carvedilol was increased. PVC burden less frequent. Discharging on amio 200 mg twice a day x 7 days and higher dose of carvedilol -->18.75 mg twice a day.   She seen by her PCP on 7/8 and was started on prednisone + lexapro. Having ongoing cough.   Today she returns for post hospital follow up. Overall feeling fair. Frustrated due to ongoing cough. Says she doesn't feel palpitations as much. Complaining of daytime fatigue and difficulty sleeping. SOB with exertion.  Denies PND. + Orthopnea. No BRBPR. Poor appetite. No fever or chills. Weight at home 215-216 pounds. Taking all medications.  [employer] Score: 12/04/2019= 0   Cardiac Studies  Echo done 05/19/2018 by Dr Haroldine Laws EF back down to 20-25% Bedside echo by Dr. Haroldine Laws with EF improved to 50%. Echo 6/20 - EF back down at 25-30%.   Cath 6/17 revealed normal vessels, moderate elevation in pulmonary artery pressures, and LVEF 25-30%.  RHC 10/2019  RA = 4 RV = 32/6 PA = 31/8 (21) PCW = 12 Fick cardiac output/index = 7.0/3.4 Thermo CO/CI = 5.2/2.5 PVR =1.7 (Thermo) Ao sat = 98% PA sat = 67%, 69%  RHC 3/19: RA = 2 RV = 26/7 PA = 28/13 (19) PCW = 5 Fick cardiac output/index = 5.7/2.8 PVR = 2.5  WU Ao sat = 99% PA sat = 71%, 72% High SVC sat = 65%  RHC 6/17 RA 5 RV 35/6 PA 41/12 PW 11 Fick 6.4/3.0 PA sat 62%  Echo 6/15 35-40% Echo 6/17 25-30% Echo 12/17 20-25% Echo 8/18 EF 25% Echo 7/19: EF 50-55% Echo 6/20: EF 25-30% Echo 11/13/19: Ef < 20% RV normal   CPX 2/18 FVC 2.57 (79%)    FEV1 2.14 (81%)     FEV1/FVC 83 (101%)     MVV 107 (102%) Resting HR: 106 Peak HR: 166  (93% age predicted max HR) BP rest: 136/98 BP peak: 174/88 Peak VO2: 17.1 (85% predicted peak  VO2) - corrects to 28.4 for ibw VE/VCO2 slope: 33 OUES: 2.16 Peak RER: 1.09 Ventilatory Threshold: 14.6 (73% predicted or measured peak VO2) VE/MVV: 59% PETCO2 at peak: 33 O2pulse: 10  (83% predicted O2pulse)  CPX 12/18 pVO2 14.0 (corrects to 23.0 for IBW) VeVCO2  32 RER 1.0  Review of systems complete and found to be negative unless listed in HPI.    Past Medical History:  Diagnosis Date  . Acute on chronic systolic (congestive) heart failure ([place]) 04/29/2017  . Acute pain of right shoulder 06/16/2017  . AICD (automatic cardioverter/defibrillator) present   . Anxiety   . Arthritis    right shoulder   . Asthma   . CHF (congestive heart failure) ([place])   . Chronic combined systolic and diastolic heart failure ([place]) 03/05/2014  . Closed low lateral malleolus fracture 10/23/2013  . Cystitis 10/21/2017  . Depression   . Depression with anxiety 01/20/2013  . Diabetes mellitus without complication ([place])   . Diverticulosis   . Dyspnea    comes and goes intermittently mostly with exertion   . Essential hypertension    Prev followed by [facility]/ Cardiology   . Fibroid    age 33  . Gallstones   . Generalized abdominal cramping   . History of cardiomyopathy   . Hypertension   . IBS (irritable bowel syndrome)   . 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 Insomnia 05/07/2017  . Labral tear of shoulder 04/04/2015    Injected 04/04/2015 Injected 12/03/2015   . Migraine    "monthly" (08/03/2016)  . Myofascial pain 06/16/2017  . NICM (nonischemic cardiomyopathy) ([place]) 08/03/2016  . Nonallopathic lesion of lumbosacral region 11/16/2016  . Nonallopathic lesion of sacral region 11/16/2016  . Nonallopathic lesion of thoracic region 08/20/2014  . Nonspecific chest pain 04/28/2017  . OSA (obstructive sleep apnea) 01/02/2013   NPSG 2009:  AHI 9/hr. CPAP intolerance >> "smothering" Good tolerance of auto device (optimal pressure 12-13 on download).  - referred to  Dr Gwenette Greet    . OSA on CPAP   . Ovarian cyst    1999; surgically removed  . Patellofemoral syndrome of both knees 10/16/2016  . 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 PVC (premature ventricular contraction) 06/23/2016  . Seizures ([place])    "as a child" (08/03/2016)  . Termination of pregnancy    due to cardiac risk    Past Surgical History:  Procedure Laterality Date  . CARDIAC CATHETERIZATION N/A 11/11/2015   Procedure: Right/Left Heart Cath and Coronary Angiography;  Surgeon: Troy Sine, MD;  Location: [facility];  Service: Cardiovascular;  Laterality: N/A;  . CARDIAC CATHETERIZATION  ~ 2015  . CARDIAC DEFIBRILLATOR PLACEMENT  08/03/2016  . CESAREAN SECTION  1999  . COLONOSCOPY WITH PROPOFOL N/A 04/21/2016   Procedure: COLONOSCOPY WITH  PROPOFOL;  Surgeon: Jerene Bears, MD;  Location: [facility];  Service: Gastroenterology;  Laterality: N/A;  . ESOPHAGOGASTRODUODENOSCOPY (EGD) WITH PROPOFOL N/A 04/21/2016   Procedure: ESOPHAGOGASTRODUODENOSCOPY (EGD) WITH PROPOFOL;  Surgeon: Jerene Bears, MD;  Location: [facility];  Service: Gastroenterology;  Laterality: N/A;  . FOOT FRACTURE SURGERY Right ~ 2003  . FRACTURE SURGERY    . ICD IMPLANT N/A 08/03/2016   Procedure: ICD Implant;  Surgeon: Deboraha Sprang, MD;  Location: [place] [facility];  Service: Cardiovascular;  Laterality: N/A;  . LAPAROSCOPIC CHOLECYSTECTOMY  12/2006  . LAPAROSCOPIC GASTRIC SLEEVE RESECTION    . LAPAROSCOPY ABDOMEN DIAGNOSTIC  2008   "cut bile duct w/gallbladder OR; had to go in later & fix leak; hospitalized for 2 months"  . LEFT HEART CATHETERIZATION WITH CORONARY ANGIOGRAM N/A 02/26/2014   Procedure: LEFT HEART CATHETERIZATION WITH CORONARY ANGIOGRAM;  Surgeon: Jettie Booze, MD;  Location: [facility] LAB;  Service: Cardiovascular;  Laterality: N/A;  . OVARIAN CYST REMOVAL Right 1999  . PVC ABLATION N/A 12/01/2017   Procedure: PVC ABLATION;  Surgeon: Evans Lance, MD;   Location: [place] [facility];  Service: Cardiovascular;  Laterality: N/A;  . RIGHT HEART CATH N/A 08/05/2017   Procedure: RIGHT HEART CATH;  Surgeon: Jolaine Artist, MD;  Location: [facility];  Service: Cardiovascular;  Laterality: N/A;  . RIGHT HEART CATH N/A 11/16/2019   Procedure: RIGHT HEART CATH;  Surgeon: Jolaine Artist, MD;  Location: [place] [facility];  Service: Cardiovascular;  Laterality: N/A;  . TUBAL LIGATION  1999    Current Medications:  Current Meds  Medication Sig  . albuterol (PROAIR HFA) 108 (90 Base) MCG/ACT inhaler Inhale 1-2 puffs into the lungs every 6 (six) hours as needed for wheezing or shortness of breath.  Marland Kitchen amiodarone (PACERONE) 200 MG tablet Take 1 tablet (200 mg total) by mouth 2 (two) times daily.  . benzonatate (TESSALON) 200 MG capsule Take 1 capsule (200 mg total) by mouth 3 (three) times daily as needed.  . butalbital-acetaminophen-caffeine (FIORICET) 50-325-40 MG tablet Take 1-2 tablets by mouth every 6 (six) hours as needed for headache.  . carvedilol (COREG) 12.5 MG tablet Take 1.5 tablets (18.75 mg total) by mouth 2 (two) times daily.  . colestipol (COLESTID) 1 g tablet Take 2 g by mouth daily as needed (IBS).  Marland Kitchen diclofenac sodium (VOLTAREN) 1 % GEL Apply 4 g topically 4 (four) times daily. (Patient taking differently: Apply 4 g topically 4 (four) times daily as needed (pain). )  . dicyclomine (BENTYL) 20 MG tablet Take 20 mg by mouth daily as needed for spasms.   Marland Kitchen escitalopram (LEXAPRO) 10 MG tablet Take 1 tablet (10 mg total) by mouth daily.  . fluticasone (FLONASE) 50 MCG/ACT nasal spray Place 2 sprays into both nostrils daily as needed for allergies.  Marland Kitchen gabapentin (NEURONTIN) 100 MG capsule Take 100 mg by mouth 2 (two) times daily as needed (back spasms during the day).   Marland Kitchen gabapentin (NEURONTIN) 300 MG capsule Take 300 mg by mouth at bedtime as needed (back spasms).  Marland Kitchen ipratropium-albuterol (DUONEB) 0.5-2.5 (3) MG/3ML SOLN Take 3  mLs by nebulization every 6 (six) hours as needed.  . ivabradine (CORLANOR) 7.5 MG TABS tablet Take 7.5 mg by mouth 2 (two) times daily with a meal.  . lidocaine (LIDODERM) 5 % Place 1 patch onto the skin daily. Remove & Discard patch within 12 hours or as directed by MD (Patient taking differently:  Place 1 patch onto the skin 2 (two) times daily as needed (pain). Remove & Discard patch within 12 hours or as directed by MD)  . Magnesium Oxide 200 MG TABS Take 1 tablet (200 mg total) by mouth daily.  . montelukast (SINGULAIR) 10 MG tablet Take 1 tablet (10 mg total) by mouth at bedtime.  . Multiple Vitamins-Minerals (MULTIVITAMIN GUMMIES WOMENS PO) Take 2 tablets by mouth at bedtime.   . nitroGLYCERIN (NITROSTAT) 0.4 MG SL tablet Place 1 tablet (0.4 mg total) under the tongue every 5 (five) minutes as needed for chest pain.  Marland Kitchen omeprazole (PRILOSEC) 20 MG capsule Take 20 mg by mouth daily as needed (for reflux symptoms).   . potassium chloride (KLOR-CON) 10 MEQ tablet Take 20 mEq by mouth 2 (two) times daily.  . predniSONE (DELTASONE) 20 MG tablet Take 2 tablets (40 mg total) by mouth daily with breakfast.  . sacubitril-valsartan (ENTRESTO) 97-103 MG Take 1 tablet by mouth 2 (two) times daily.  . SYMBICORT 160-4.5 MCG/ACT inhaler INHALE 2 PUFFS INTO THE LUNGS 2 (TWO) TIMES DAILY AS NEEDED. (Patient taking differently: Inhale 2 puffs into the lungs 2 (two) times daily as needed (shortness of breath/wheezing). )  . tiZANidine (ZANAFLEX) 4 MG tablet Take 4 mg by mouth every 8 (eight) hours as needed for muscle spasms.   Marland Kitchen torsemide (DEMADEX) 20 MG tablet Take 1 tablet (20 mg total) by mouth daily.  Marland Kitchen Ubrogepant (UBRELVY) 50 MG TABS Take 50 mg by mouth every 2 (two) hours as needed (migraine). (Patient taking differently: Take 50 mg by mouth See admin instructions. Take one tablet (50 mg) by mouth at onset of migraine headache, may repeat in 30 minutes if still needed.)  . Vitamin D, Ergocalciferol,  (DRISDOL) 1.25 MG (50000 UT) CAPS capsule Take 1 capsule (50,000 Units total) by mouth every 7 (seven) days. (Patient taking differently: Take 50,000 Units by mouth every Wednesday. )  . [DISCONTINUED] butalbital-acetaminophen-caffeine (FIORICET) 50-325-40 MG tablet TAKE 1 TO 2 TABLETS BY MOUTH EVERY 6 HOURS AS NEEDED FOR HEADACHE (Patient taking differently: Take 1-2 tablets by mouth every 6 (six) hours as needed for headache or migraine. )  . [DISCONTINUED] colestipol (COLESTID) 1 g tablet Take 2 tablets (2 g total) by mouth 2 (two) times daily. (Patient taking differently: Take 2 g by mouth daily as needed (IBS). )  . [DISCONTINUED] ivabradine (CORLANOR) 7.5 MG TABS tablet TAKE 1 TABLET (7.5 MG TOTAL) BY MOUTH 2 (TWO) TIMES DAILY WITH A MEAL. (Patient taking differently: Take 7.5 mg by mouth daily with lunch. )   Allergies:   Vancomycin, Aspirin, Contrast media [iodinated diagnostic agents], Avocado, Ciprofloxacin, Farxiga [dapagliflozin], and Sulfa antibiotics   Social History   Socioeconomic History  . Marital status: Married    Spouse name: Not on file  . Number of children: 1  . Years of education: Not on file  . Highest education level: Not on file  Occupational History  . Occupation: stay at home mom  Tobacco Use  . Smoking status: Never Smoker  . Smokeless tobacco: Never Used  Vaping Use  . Vaping Use: Never used  Substance and Sexual Activity  . Alcohol use: No  . Drug use: No  . Sexual activity: Yes  Other Topics Concern  . Not on file  Social History Narrative  . Not on file   Social Determinants of Health   Financial Resource Strain:   . Difficulty of Paying Living Expenses:   Food Insecurity: No Food Insecurity  .  Worried About Charity fundraiser in the Last Year: Never true  . Ran Out of Food in the Last Year: Never true  Transportation Needs: No Transportation Needs  . Lack of Transportation (Medical): No  . Lack of Transportation (Non-Medical): No  Physical  Activity: Insufficiently Active  . Days of Exercise per Week: 2 days  . Minutes of Exercise per Session: 40 min  Stress:   . Feeling of Stress :   Social Connections: Socially Isolated  . Frequency of Communication with Friends and Family: Twice a week  . Frequency of Social Gatherings with Friends and Family: Never  . Attends Religious Services: Never  . Active Member of Clubs or Organizations: No  . Attends Archivist Meetings: Never  . Marital Status: Married    Family History:  The patient's family history includes Allergies in her daughter; Emphysema in her maternal grandmother; Heart disease (age of onset: 21) in her maternal grandmother; Rheum arthritis in her mother.   Vitals:   12/05/19 1119  BP: 140/78  Pulse: 88  SpO2: 98%  Weight: 97.7 kg (215 lb 6.4 oz)    Wt Readings from Last 3 Encounters:  12/05/19 97.7 kg (215 lb 6.4 oz)  11/30/19 99.3 kg (219 lb)  11/28/19 100.8 kg (222 lb 3.6 oz)   Physical Exam :  General:  Well appearing. No resp difficulty HEENT: normal Neck: supple. no JVD. Carotids 2+ bilat; no bruits. No lymphadenopathy or thryomegaly appreciated. Cor: PMI nondisplaced. Irregular rate & rhythm. No rubs, gallops or murmurs. Lungs: clear Abdomen: soft, nontender, nondistended. No hepatosplenomegaly. No bruits or masses. Good bowel sounds. Extremities: no cyanosis, clubbing, rash, edema Neuro: alert & orientedx3, cranial nerves grossly intact. moves all 4 extremities w/o difficulty. Affect pleasant  EKG: SR with frequent PVCs.   Assessment and Plan 1. Chronic systolic HF:  due to peri-partum CM, onset 1999. S/P [employer] ICD 07/2016 - EF 20-25% (12/17).  - Echo 8/18 EF 25-30% s/p ICD 3/18. [place] 3/19: RA 2, PA 28/13, Fick CO 5.7, CI 2.8 - CPX 12/18 showed moderate limitation due mostly to obesity with some HF component - Echo 11/22/17 LVEF 50-55%, Grade 1 DD, Mild MR, Mod LAE - Echo 04/2018 by Dr Haroldine Laws EF20-25%. RV ok - Echo 10/2019  EF < 20%. - NYHA III. Continue 20 mg torsemide daily.    - Continue carvedilol 18.75 mg twice a day.  - Continue Entresto 97-103 mg BID - Off spiro in the hospital. May rechallenge soon.  - Continue corlanor 7.5mg  BID for now.  -No farixiga due to rash  -CPX test the end of the month.   - Check BMET   2. Morbid obesity - s/p Gastric Sleeve at [facility] 09/20/17  Body mass index is 34.77 kg/m. Hopefully she had start walking soon.   3. Daytime Fatigue, Difficult sleeping.  - Repeat sleep study in 2019 was negative for sleep apnea but now having day time fatigue again.  - Unable to able get home sleep study due to cost - Set up for outpatient sleep study at [facility]   4. Anxiety/depression - Per PCP.   5. DM2 - A1c in Jan. 2018 is 5.3 with medications.  - Per PCP.  - Intolerant farxiga due to rash  6. Frequent PVCs/ Bigeminy - Holter Monitor 11/09/17 with 30% PVCs with one primary morphology.  - S/p PVC ablation 11/2017 with Dr Lovena Le unsuccessful - Off flecainide with EF down and persistent PVCs. Changed to amio -  Zio Patch 04/2019 > 17% PVCs and >8% couplets.  - Zio Patch returned on 11/28/2019   - Continue amio 200 mg twice a day + 200 mg daily + carvedilol 18.75 mg twice a day .  She has follow up with Dr Lovena Le.  - needs annual eye exams   Follow up in 4 weeks.    Darrick Grinder, NP  12/05/2019 11:25 AM

## 2019-12-05 NOTE — Telephone Encounter (Signed)
-----   Message from [place], NP sent at 12/05/2019  2:56 PM EDT ----- K a little low. Increase potassium to 40 meq twice a day. Check BMET in 7 days. Please call.

## 2019-12-05 NOTE — Telephone Encounter (Signed)
Pt aware and voiced understanding Would like to have labs repeated at CPX test

## 2019-12-05 NOTE — Patient Instructions (Addendum)
Labs done today. We will contact you only if your labs are abnormal.  No medication changes were made. Please continue all current medications.  Your physician recommends that you keep your scheduled follow-up appointment on Thursday, August 26th, 2021 at 2:30pm   If you have any questions or concerns before your next appointment please send [place] a message through [place] or call our office at [phone].    TO LEAVE A MESSAGE FOR THE NURSE SELECT OPTION 2, PLEASE LEAVE A MESSAGE INCLUDING: . YOUR NAME . DATE OF BIRTH . CALL BACK NUMBER . REASON FOR CALL**this is important as we prioritize the call backs  YOU WILL RECEIVE A CALL BACK THE SAME DAY AS LONG AS YOU CALL BEFORE 4:00 PM   Do the following things EVERYDAY: 1) Weigh yourself in the morning before breakfast. Write it down and keep it in a log. 2) Take your medicines as prescribed 3) Eat low salt foods--Limit salt (sodium) to 2000 mg per day.  4) Stay as active as you can everyday 5) Limit all fluids for the day to less than 2 liters   At the [facility], you and your health needs are our priority. As part of our continuing mission to provide you with exceptional heart care, we have created designated Provider Care Teams. These Care Teams include your primary Cardiologist (physician) and Advanced Practice Providers (APPs- Physician Assistants and Nurse Practitioners) who all work together to provide you with the care you need, when you need it.   You may see any of the following providers on your designated Care Team at your next follow up: Marland Kitchen Dr Glori Bickers . Dr Loralie Champagne . Darrick Grinder, NP . Lyda Jester, PA . Audry Riles, PharmD   Please be sure to bring in all your medications bottles to every appointment.

## 2019-12-08 ENCOUNTER — Encounter (HOSPITAL_COMMUNITY): Payer: Self-pay

## 2019-12-08 DIAGNOSIS — R0602 Shortness of breath: Secondary | ICD-10-CM | POA: Diagnosis not present

## 2019-12-08 DIAGNOSIS — U071 COVID-19: Secondary | ICD-10-CM | POA: Diagnosis not present

## 2019-12-08 DIAGNOSIS — R062 Wheezing: Secondary | ICD-10-CM | POA: Diagnosis not present

## 2019-12-08 DIAGNOSIS — R05 Cough: Secondary | ICD-10-CM | POA: Diagnosis not present

## 2019-12-11 ENCOUNTER — Ambulatory Visit (INDEPENDENT_AMBULATORY_CARE_PROVIDER_SITE_OTHER): Payer: HMO

## 2019-12-11 DIAGNOSIS — Z95810 Presence of automatic (implantable) cardiac defibrillator: Secondary | ICD-10-CM

## 2019-12-11 DIAGNOSIS — I5042 Chronic combined systolic (congestive) and diastolic (congestive) heart failure: Secondary | ICD-10-CM | POA: Diagnosis not present

## 2019-12-13 ENCOUNTER — Other Ambulatory Visit: Payer: Self-pay

## 2019-12-13 NOTE — Progress Notes (Signed)
EPIC Encounter for ICM Monitoring  Patient Name: [name] is a 44 y.o. female Date: 12/13/2019 Primary Care Physican: Hoyt Koch, MD Primary Cardiologist:Bensimhon Electrophysiologist: Caryl Comes 10/18/2019 Weight: 214 lbs  Transmission reviewed. Patient readmitted for overnight hospital stay on 7/5  7/18/2021HeartlogicHFIndexhas remained at 0 suggesting normal fluid levels since hospital discharge on 11/17/2019.   Prescribed:  Torsemide20 mgTake 1 tablet (20 mg total) by mouth daily.  Potassium 10 mEq take4tablets(40 mEq total)twice a day.  Labs: 11/17/2019 Creatinine 1.04, BUN 15, Potassium 4.9, Sodium 134, GFR >60 11/16/2019 Creatinine 1.20, BUN 21, Potassium 4.4, Sodium 135, GFR 55->60  11/15/2019 Creatinine 1.81, BUN 28, Potassium 4.0, Sodium 135, GFR 33-38  11/14/2019 Creatinine 1.77, BUN 20, Potassium 4.0, Sodium 136, GFR 34-40  11/13/2019 Creatinine 0.81, BUN 6,   Potassium 3.2, Sodium 134, GFR >60  11/12/2019 Creatinine 0.77, BUN 8,   Potassium 3.5, Sodium 138, GFR >60  09/07/2019 Creatinine 0.74, BUN 9,   Potassium 4.2, Sodium 142, GFR 98-113  A complete set of results can be found in Results Review.  Recommendations:None  Follow-up plan: ICM clinic phone appointment on8/23/2021. 91 day device clinic remote transmission7/27/2021.             EP/Cardiology next office visit: 12/27/2019 with Dr. Lovena Le         Copy of ICM check sent to Dr. Caryl Comes.  3 Month Trend    8 Day Data Trend          [name], RN 12/13/2019 4:04 PM

## 2019-12-13 NOTE — Patient Outreach (Addendum)
  [facility]) Care Management Chronic Special Needs Program  12/13/2019  Name: [name] DOB: 22-Mar-1976  MRN: [id number]  [name] is enrolled in a chronic special needs plan for Heart Failure.   Subjective: client denies chest pain, she reports still has some shortness of breath. She reports primary concern is getting sleep study completed to see if she is having sleep apnea. She also is interested in having medications pre-packaged.   PHQ2 score 6 and PHQ9 score 15. Client recently discussed mood with primary care started on Lexapro 11/30/2019. Client reports she has been taking medication as prescribed. RNCM reinforced medication does not work immediately and may be a few weeks before she sees improvement. Client is receptive to [place] work referral for counseling/resources.   Goals Addressed            This Visit's Progress   . General - Client will not be readmitted within 30 days (C-SNP)-discharge date 11/17/2019   On track    Continue to take medications as prescribed. Continue to attend appointments as scheduled. Referral made to pharmacist regarding your interest in obtaining pill packaging.     . Patient Stated: "feel better"   On track    Discussed mood with client. Discussed with client [facility] social worker available to assist with needs.  Medications reviewed and encouraged client to continue to take medication (lexapro) as prescribed. Please call your doctor if you have any questions or concerns regarding medication.  Attend follow up visits as scheduled. Referral made for [facility] social worker.      Plan: referral to social work completed, Call completed(spoke with Kevan Rosebush) to HF clinic regarding client's concern about scheduling of sleep study. Referral to pharmacist completed. Send updated care plan to client, send updated care plan to primary care provider. RNCM will outreach to client within the next 1-2 months.   Thea Silversmith,  RN, MSN, [place] [phone]

## 2019-12-13 NOTE — Addendum Note (Signed)
Encounter addended by: [name], RN on: 12/13/2019 1:31 PM  Actions taken: Visit diagnoses modified, Order list changed, Diagnosis association updated

## 2019-12-13 NOTE — Patient Outreach (Signed)
  [facility]) Care Management Chronic Special Needs Program   12/13/2019  Name: [name], DOB: 1976-04-14  MRN: [id number]  The client was discussed in today's interdisciplinary care team meeting.  The following issues were discussed:  Client's needs, Changes in health status, Key risk triggers/risk stratification, Care Plan, Coordination of care, Care transitions and Issues/barriers to care  Participants present:   Bary Castilla, BSN, [place], CCM  Quinn Plowman, BSN, CCM  [employer], BSN, CCM, CDE  Thea Silversmith, BSN, MSN, CCM  [place] [facility], [facility]/ CMAA  Dr. Juanda Bond, Pharm D HTA Jacqlyn Larsen, [facility]  Recommendations: readmission and changes in health status. Increase to tier 3  Plan: RNCM will continue to follow.  Thea Silversmith, RN, MSN, [place] [phone]

## 2019-12-14 ENCOUNTER — Telehealth (HOSPITAL_COMMUNITY): Payer: Self-pay | Admitting: Vascular Surgery

## 2019-12-14 NOTE — Telephone Encounter (Signed)
Left pt message giving sleep study appt 8/22 @ [facility], asked pt to call back to confirm

## 2019-12-16 DIAGNOSIS — I493 Ventricular premature depolarization: Secondary | ICD-10-CM | POA: Diagnosis not present

## 2019-12-18 ENCOUNTER — Encounter: Payer: Self-pay | Admitting: *Deleted

## 2019-12-18 ENCOUNTER — Other Ambulatory Visit: Payer: Self-pay | Admitting: *Deleted

## 2019-12-18 NOTE — Patient Outreach (Addendum)
[facility] [facility]) Care Management  12/18/2019  [name] 12-May-1976 [id number]   CSW made an attempt to try and contact patient today to perform the initial phone assessment, as well as assess and assist with social work needs and services, without success.  CSW was unable to leave a HIPAA compliant message on voicemail for patient, receiving an automated recording indicating that patient's mailbox is full and unable to accept new messages at this time.  CSW will make a second outreach attempt within the next 3-4 business days, if a return call is not received from patient in the meantime.  CSW will also mail an Unsuccessful Patient Outreach Letter to patient's home, requesting that patient contact CSW at her earliest convenience, if she is interested in receiving social work services through [place] with [facility].  [name], BSW, MSW, LCSW  Licensed Education officer, environmental Health System  Mailing [place] [address] [place], [place] [address] Physical Address-[address], [place], [place] [address] Toll Free Main # [phone] Fax # [phone] Cell # [phone]  Office # [phone] [name]

## 2019-12-19 ENCOUNTER — Other Ambulatory Visit: Payer: Self-pay

## 2019-12-19 ENCOUNTER — Ambulatory Visit (INDEPENDENT_AMBULATORY_CARE_PROVIDER_SITE_OTHER): Payer: HMO | Admitting: *Deleted

## 2019-12-19 ENCOUNTER — Ambulatory Visit (HOSPITAL_COMMUNITY): Payer: HMO | Attending: Cardiology

## 2019-12-19 DIAGNOSIS — I5042 Chronic combined systolic (congestive) and diastolic (congestive) heart failure: Secondary | ICD-10-CM | POA: Diagnosis not present

## 2019-12-19 DIAGNOSIS — I472 Ventricular tachycardia, unspecified: Secondary | ICD-10-CM

## 2019-12-20 LAB — CUP PACEART REMOTE DEVICE CHECK
Battery Remaining Longevity: 144 mo
Battery Remaining Percentage: 100 %
Brady Statistic RV Percent Paced: 0 %
Date Time Interrogation Session: 20210727005500
HighPow Impedance: 68 Ohm
Implantable Lead Implant Date: 20180312
Implantable Lead Location: 753860
Implantable Lead Model: 293
Implantable Lead Serial Number: 422842
Implantable Pulse Generator Implant Date: 20180312
Lead Channel Impedance Value: 630 Ohm
Lead Channel Setting Pacing Amplitude: 2.5 V
Lead Channel Setting Pacing Pulse Width: 0.4 ms
Lead Channel Setting Sensing Sensitivity: 0.5 mV
Pulse Gen Serial Number: 226601

## 2019-12-21 ENCOUNTER — Other Ambulatory Visit: Payer: Self-pay | Admitting: *Deleted

## 2019-12-21 NOTE — Patient Outreach (Signed)
[facility]) Care Management  12/21/2019  [name] [birth year] [id number]   CSW made a second attempt to try and contact patient today to perform the initial phone assessment, as well as assess and assist with social work needs and services, without success.  CSW was unable to leave a HIPAA compliant message on voicemail for patient, receiving an automated recording indicating that patient's mailbox is full and unable to accept new messages at this time.  CSW will make a second outreach attempt within the next 3-4 business days, if a return call is not received from patient in the meantime.    [name], BSW, MSW, LCSW  Licensed Education officer, environmental Health System  Mailing [place] [address] [place], [place] [address] Physical Address-[address], [place], [place] [address] Toll Free Main # [phone] Fax # [phone] Cell # [phone]  Office # [phone] [name]

## 2019-12-22 NOTE — Progress Notes (Signed)
Remote ICD transmission.   

## 2019-12-26 ENCOUNTER — Other Ambulatory Visit: Payer: Self-pay | Admitting: *Deleted

## 2019-12-26 ENCOUNTER — Encounter: Payer: Self-pay | Admitting: Internal Medicine

## 2019-12-26 NOTE — Patient Outreach (Signed)
[name]) Care Management  12/26/2019  [name] 1975-10-03 [id number]    CSW made a third attempt to try and contact patient today to perform the initial phone assessment, as well as assess and assist with social work needs and services, without success.  A HIPAA compliant message was left for patient on voicemail.  CSW is currently awaiting a return call.  CSW will make a fourth and final outreach attempt within the next 4 weeks, if a return call is not received from patient in the meantime.  If the fourth call attempt is unsuccessful, CSW will proceed with case closure, as required number of phone attempts will have been made and an outreach letter mailed to patient's home allowing more than a month for patient to respond if she was interested in receiving social work services through [place] with [facility].    [name], BSW, MSW, LCSW  Licensed Education officer, environmental Health System  Mailing [place] [address], [place], [place] [address] Physical Address-[address], [place], [place] [address] Toll Free Main # [phone] Fax # [phone] Cell # [phone]  Office # [phone] [name]

## 2019-12-27 ENCOUNTER — Encounter: Payer: Self-pay | Admitting: Family Medicine

## 2019-12-27 ENCOUNTER — Telehealth: Payer: Self-pay | Admitting: Family Medicine

## 2019-12-27 ENCOUNTER — Ambulatory Visit: Payer: HMO

## 2019-12-27 ENCOUNTER — Ambulatory Visit (INDEPENDENT_AMBULATORY_CARE_PROVIDER_SITE_OTHER): Payer: HMO | Admitting: Family Medicine

## 2019-12-27 ENCOUNTER — Other Ambulatory Visit: Payer: Self-pay

## 2019-12-27 ENCOUNTER — Ambulatory Visit (INDEPENDENT_AMBULATORY_CARE_PROVIDER_SITE_OTHER): Payer: HMO | Admitting: Internal Medicine

## 2019-12-27 VITALS — BP 112/84 | HR 100 | Ht 66.0 in | Wt 219.0 lb

## 2019-12-27 DIAGNOSIS — I5042 Chronic combined systolic (congestive) and diastolic (congestive) heart failure: Secondary | ICD-10-CM

## 2019-12-27 DIAGNOSIS — M545 Low back pain, unspecified: Secondary | ICD-10-CM

## 2019-12-27 DIAGNOSIS — G8929 Other chronic pain: Secondary | ICD-10-CM

## 2019-12-27 DIAGNOSIS — I428 Other cardiomyopathies: Secondary | ICD-10-CM

## 2019-12-27 DIAGNOSIS — I493 Ventricular premature depolarization: Secondary | ICD-10-CM | POA: Diagnosis not present

## 2019-12-27 DIAGNOSIS — Z95810 Presence of automatic (implantable) cardiac defibrillator: Secondary | ICD-10-CM

## 2019-12-27 LAB — CUP PACEART INCLINIC DEVICE CHECK
Date Time Interrogation Session: 20210804000000
HighPow Impedance: 67 Ohm
Implantable Lead Implant Date: 20180312
Implantable Lead Location: 753860
Implantable Lead Model: 293
Implantable Lead Serial Number: 422842
Implantable Pulse Generator Implant Date: 20180312
Lead Channel Impedance Value: 622 Ohm
Lead Channel Pacing Threshold Amplitude: 1.3 V
Lead Channel Pacing Threshold Pulse Width: 0.4 ms
Lead Channel Sensing Intrinsic Amplitude: 14.3 mV
Lead Channel Setting Pacing Amplitude: 2.5 V
Lead Channel Setting Pacing Pulse Width: 0.4 ms
Lead Channel Setting Sensing Sensitivity: 0.5 mV
Pulse Gen Serial Number: 226601

## 2019-12-27 MED ORDER — AMIODARONE HCL 200 MG PO TABS: 200.0000 mg | ORAL_TABLET | Freq: Two times a day (BID) | ORAL | 0 refills | Status: DC

## 2019-12-27 MED ORDER — DUEXIS 800-26.6 MG PO TABS: 1.0000 | ORAL_TABLET | Freq: Two times a day (BID) | ORAL | 0 refills | Status: DC | PRN

## 2019-12-27 MED ORDER — AMIODARONE HCL 200 MG PO TABS
200.0000 mg | ORAL_TABLET | Freq: Every day | ORAL | 3 refills | Status: DC
Start: 2020-01-24 — End: 2020-01-24

## 2019-12-27 NOTE — Telephone Encounter (Signed)
Patient called requesting a refill on her Duexis to be sent to [facility] an [employer].

## 2019-12-27 NOTE — Patient Instructions (Addendum)
Medication Instructions:  Your physician has recommended you make the following change in your medication:   1.  Continue your amiodarone 200 mg twice a day by mouth until January 23, 2020.  On January 24, 2020 you will REDUCE your amiodarone 200 mg to ONE tablet by mouth a day.  Labwork: None ordered.  Testing/Procedures: None ordered.  Follow-Up: Your physician wants you to follow-up in: October with Dr. Lovena Le.   March 12, 2020 at 4:00 pm at the [facility] office.     Remote monitoring is used to monitor your ICD from home. This monitoring reduces the number of office visits required to check your device to one time per year. It allows [place] to keep an eye on the functioning of your device to ensure it is working properly. You are scheduled for a device check from home on 01/15/2020. You may send your transmission at any time that day. If you have a wireless device, the transmission will be sent automatically. After your physician reviews your transmission, you will receive a postcard with your next transmission date.  Any Other Special Instructions Will Be Listed Below (If Applicable).  If you need a refill on your cardiac medications before your next appointment, please call your pharmacy.

## 2019-12-27 NOTE — Progress Notes (Signed)
HPI [name] returns today for followup. She is a pleasant 44 yo woman with a non-ischyemic CM, s/p ICD insertion. She developed symptomatic palpitations and underwent EP study a couple of years ago and was found to have PVC's originating from the RVOT/LVOT. Catheter ablation would suppress but no eliminate the PVC's. She has been placed on amiodarone. She presented a couple of weeks ago with more symptomatic PVC's. She had her amiodarone uptitrated to 200 bid and her PVC's are better. She has not had trouble with the extra amiodarone.  Allergies  Allergen Reactions  . Vancomycin Other (See Comments)    "did something to my kidneys," PROGRESSED TO KIDNEY FAILURE!!  Marland Kitchen Aspirin Other (See Comments)    Wheezing, (Pt states that she just wheezes some when she takes aspirin by itself but she can take aspirin in a combination product).  . Contrast Media [Iodinated Diagnostic Agents] Other (See Comments)    Multiple CT contrast studies done over 2 weeks caused ARF  . Avocado Itching and Swelling  . Ciprofloxacin Itching and Rash  . Farxiga [Dapagliflozin] Rash  . Sulfa Antibiotics Itching and Rash     Current Outpatient Medications  Medication Sig Dispense Refill  . albuterol (PROAIR HFA) 108 (90 Base) MCG/ACT inhaler Inhale 1-2 puffs into the lungs every 6 (six) hours as needed for wheezing or shortness of breath. 18 g 5  . amiodarone (PACERONE) 200 MG tablet Take 1 tablet (200 mg total) by mouth 2 (two) times daily. 45 tablet 5  . benzonatate (TESSALON) 200 MG capsule Take 1 capsule (200 mg total) by mouth 3 (three) times daily as needed. 90 capsule 0  . butalbital-acetaminophen-caffeine (FIORICET) 50-325-40 MG tablet Take 1-2 tablets by mouth every 6 (six) hours as needed for headache.    . carvedilol (COREG) 12.5 MG tablet Take 1.5 tablets (18.75 mg total) by mouth 2 (two) times daily. 90 tablet 6  . colestipol (COLESTID) 1 g tablet Take 2 g by mouth daily as needed (IBS).    Marland Kitchen  diclofenac sodium (VOLTAREN) 1 % GEL Apply 4 g topically 4 (four) times daily. (Patient taking differently: Apply 4 g topically 4 (four) times daily as needed (pain). ) 300 g 6  . dicyclomine (BENTYL) 20 MG tablet Take 20 mg by mouth daily as needed for spasms.     Marland Kitchen escitalopram (LEXAPRO) 10 MG tablet Take 1 tablet (10 mg total) by mouth daily. 90 tablet 3  . fluticasone (FLONASE) 50 MCG/ACT nasal spray Place 2 sprays into both nostrils daily as needed for allergies. 48 g 0  . gabapentin (NEURONTIN) 100 MG capsule Take 100 mg by mouth 2 (two) times daily as needed (back spasms during the day).     Marland Kitchen gabapentin (NEURONTIN) 300 MG capsule Take 300 mg by mouth at bedtime as needed (back spasms).    . Ibuprofen-Famotidine (DUEXIS) 800-26.6 MG TABS Take 1 tablet by mouth 2 (two) times daily as needed. 30 tablet 0  . ivabradine (CORLANOR) 7.5 MG TABS tablet Take 7.5 mg by mouth 2 (two) times daily with a meal.    . lidocaine (LIDODERM) 5 % Place 1 patch onto the skin daily. Remove & Discard patch within 12 hours or as directed by MD (Patient taking differently: Place 1 patch onto the skin 2 (two) times daily as needed (pain). Remove & Discard patch within 12 hours or as directed by MD) 30 patch 6  . Magnesium Oxide 200 MG TABS Take 1 tablet (  200 mg total) by mouth daily. 90 tablet 3  . montelukast (SINGULAIR) 10 MG tablet Take 1 tablet (10 mg total) by mouth at bedtime. 30 tablet 3  . Multiple Vitamins-Minerals (MULTIVITAMIN GUMMIES WOMENS PO) Take 2 tablets by mouth at bedtime.     . nitroGLYCERIN (NITROSTAT) 0.4 MG SL tablet Place 1 tablet (0.4 mg total) under the tongue every 5 (five) minutes as needed for chest pain. 75 tablet 0  . potassium chloride (MICRO-K) 10 MEQ CR capsule Take 4 capsules (40 mEq total) by mouth 2 (two) times daily. 240 capsule 1  . predniSONE (DELTASONE) 20 MG tablet Take 2 tablets (40 mg total) by mouth daily with breakfast. 14 tablet 0  . sacubitril-valsartan (ENTRESTO) 97-103  MG Take 1 tablet by mouth 2 (two) times daily. 60 tablet 6  . SYMBICORT 160-4.5 MCG/ACT inhaler INHALE 2 PUFFS INTO THE LUNGS 2 (TWO) TIMES DAILY AS NEEDED. (Patient taking differently: Inhale 2 puffs into the lungs 2 (two) times daily as needed (shortness of breath/wheezing). ) 30.6 Inhaler 3  . tiZANidine (ZANAFLEX) 4 MG tablet Take 4 mg by mouth every 8 (eight) hours as needed for muscle spasms.     Marland Kitchen torsemide (DEMADEX) 20 MG tablet Take 1 tablet (20 mg total) by mouth daily. 30 tablet 0  . Ubrogepant (UBRELVY) 50 MG TABS Take 50 mg by mouth every 2 (two) hours as needed (migraine). (Patient taking differently: Take 50 mg by mouth See admin instructions. Take one tablet (50 mg) by mouth at onset of migraine headache, may repeat in 30 minutes if still needed.) 30 tablet 2  . Vitamin D, Ergocalciferol, (DRISDOL) 1.25 MG (50000 UT) CAPS capsule Take 1 capsule (50,000 Units total) by mouth every 7 (seven) days. (Patient taking differently: Take 50,000 Units by mouth every Wednesday. ) 12 capsule 0  . ipratropium-albuterol (DUONEB) 0.5-2.5 (3) MG/3ML SOLN Take 3 mLs by nebulization every 6 (six) hours as needed. 360 mL 0  . omeprazole (PRILOSEC) 20 MG capsule Take 20 mg by mouth daily as needed (for reflux symptoms).      No current facility-administered medications for this visit.     Past Medical History:  Diagnosis Date  . Acute on chronic systolic (congestive) heart failure ([place]) 04/29/2017  . Acute pain of right shoulder 06/16/2017  . AICD (automatic cardioverter/defibrillator) present   . Anxiety   . Arthritis    right shoulder   . Asthma   . CHF (congestive heart failure) ([place])   . Chronic combined systolic and diastolic heart failure ([place]) 03/05/2014  . Closed low lateral malleolus fracture 10/23/2013  . Cystitis 10/21/2017  . Depression   . Depression with anxiety 01/20/2013  . Diabetes mellitus without complication ([place])   . Diverticulosis   . Dyspnea    comes and goes  intermittently mostly with exertion   . Essential hypertension    Prev followed by [facility]/ Cardiology   . Fibroid    age 1  . Gallstones   . Generalized abdominal cramping   . History of cardiomyopathy   . Hypertension   . IBS (irritable bowel syndrome)   . 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 Insomnia 05/07/2017  . Labral tear of shoulder 04/04/2015    Injected 04/04/2015 Injected 12/03/2015   . Migraine    "monthly" (08/03/2016)  . Myofascial pain 06/16/2017  . NICM (nonischemic cardiomyopathy) ([place]) 08/03/2016  . Nonallopathic lesion of lumbosacral region 11/16/2016  . Nonallopathic lesion of sacral region 11/16/2016  .  Nonallopathic lesion of thoracic region 08/20/2014  . Nonspecific chest pain 04/28/2017  . OSA (obstructive sleep apnea) 01/02/2013   NPSG 2009:  AHI 9/hr. CPAP intolerance >> "smothering" Good tolerance of auto device (optimal pressure 12-13 on download).  - referred to Dr Gwenette Greet    . OSA on CPAP   . Ovarian cyst    1999; surgically removed  . Patellofemoral syndrome of both knees 10/16/2016  . 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 PVC (premature ventricular contraction) 06/23/2016  . Seizures ([place])    "as a child" (08/03/2016)  . Termination of pregnancy    due to cardiac risk    ROS:   All systems reviewed and negative except as noted in the HPI.   Past Surgical History:  Procedure Laterality Date  . CARDIAC CATHETERIZATION N/A 11/11/2015   Procedure: Right/Left Heart Cath and Coronary Angiography;  Surgeon: Troy Sine, MD;  Location: [place] [facility];  Service: Cardiovascular;  Laterality: N/A;  . CARDIAC CATHETERIZATION  ~ 2015  . CARDIAC DEFIBRILLATOR PLACEMENT  08/03/2016  . CESAREAN SECTION  1999  . COLONOSCOPY WITH PROPOFOL N/A 04/21/2016   Procedure: COLONOSCOPY WITH PROPOFOL;  Surgeon: Jerene Bears, MD;  Location: [facility];  Service: Gastroenterology;  Laterality: N/A;  .  ESOPHAGOGASTRODUODENOSCOPY (EGD) WITH PROPOFOL N/A 04/21/2016   Procedure: ESOPHAGOGASTRODUODENOSCOPY (EGD) WITH PROPOFOL;  Surgeon: Jerene Bears, MD;  Location: [facility];  Service: Gastroenterology;  Laterality: N/A;  . FOOT FRACTURE SURGERY Right ~ 2003  . FRACTURE SURGERY    . ICD IMPLANT N/A 08/03/2016   Procedure: ICD Implant;  Surgeon: Deboraha Sprang, MD;  Location: [place] [facility];  Service: Cardiovascular;  Laterality: N/A;  . LAPAROSCOPIC CHOLECYSTECTOMY  12/2006  . LAPAROSCOPIC GASTRIC SLEEVE RESECTION    . LAPAROSCOPY ABDOMEN DIAGNOSTIC  2008   "cut bile duct w/gallbladder OR; had to go in later & fix leak; hospitalized for 2 months"  . LEFT HEART CATHETERIZATION WITH CORONARY ANGIOGRAM N/A 02/26/2014   Procedure: LEFT HEART CATHETERIZATION WITH CORONARY ANGIOGRAM;  Surgeon: Jettie Booze, MD;  Location: [facility] C[facility] LAB;  Service: Cardiovascular;  Laterality: N/A;  . OVARIAN CYST REMOVAL Right 1999  . PVC ABLATION N/A 12/01/2017   Procedure: PVC ABLATION;  Surgeon: Evans Lance, MD;  Location: [place] [facility];  Service: Cardiovascular;  Laterality: N/A;  . RIGHT HEART CATH N/A 08/05/2017   Procedure: RIGHT HEART CATH;  Surgeon: Jolaine Artist, MD;  Location: [place] [facility];  Service: Cardiovascular;  Laterality: N/A;  . RIGHT HEART CATH N/A 11/16/2019   Procedure: RIGHT HEART CATH;  Surgeon: Jolaine Artist, MD;  Location: [place] [facility];  Service: Cardiovascular;  Laterality: N/A;  . TUBAL LIGATION  1999     Family History  Problem Relation Age of Onset  . Emphysema Maternal Grandmother        smoked  . Heart disease Maternal Grandmother 85       MI  . Rheum arthritis Mother   . Allergies Daughter   . Colon cancer Neg Hx      Social History   Socioeconomic History  . Marital status: Married    Spouse name: Not on file  . Number of children: 1  . Years of education: Not on file  . Highest education level: Not on file  Occupational  History  . Occupation: stay at home mom  Tobacco Use  . Smoking status: Never Smoker  .  Smokeless tobacco: Never Used  Vaping Use  . Vaping Use: Never used  Substance and Sexual Activity  . Alcohol use: No  . Drug use: No  . Sexual activity: Yes  Other Topics Concern  . Not on file  Social History Narrative  . Not on file   Social Determinants of Health   Financial Resource Strain:   . Difficulty of Paying Living Expenses:   Food Insecurity: No Food Insecurity  . Worried About Charity fundraiser in the Last Year: Never true  . Ran Out of Food in the Last Year: Never true  Transportation Needs: No Transportation Needs  . Lack of Transportation (Medical): No  . Lack of Transportation (Non-Medical): No  Physical Activity: Insufficiently Active  . Days of Exercise per Week: 2 days  . Minutes of Exercise per Session: 40 min  Stress:   . Feeling of Stress :   Social Connections: Socially Isolated  . Frequency of Communication with Friends and Family: Twice a week  . Frequency of Social Gatherings with Friends and Family: Never  . Attends Religious Services: Never  . Active Member of Clubs or Organizations: No  . Attends Archivist Meetings: Never  . Marital Status: Married  Human resources officer Violence:   . Fear of Current or Ex-Partner:   . Emotionally Abused:   Marland Kitchen Physically Abused:   . Sexually Abused:      BP 112/84   Pulse 100   Ht 5\' 6"  (1.676 m)   Wt 219 lb (99.3 kg)   BMI 35.35 kg/m   Physical Exam:  Well appearing NAD HEENT: Unremarkable Neck:  No JVD, no thyromegally Lymphatics:  No adenopathy Back:  No CVA tenderness Lungs:  Clear with no wheezes HEART:  Regular rate rhythm, no murmurs, no rubs, no clicks Abd:  soft, positive bowel sounds, no organomegally, no rebound, no guarding Ext:  2 plus pulses, no edema, no cyanosis, no clubbing Skin:  No rashes no nodules Neuro:  CN II through XII intact, motor grossly intact  EKG - nsr with  PVC's  DEVICE  Normal device function.  See PaceArt for details. She has 3% PVC's  Assess/Plan: 1. PVC"s - I have discussed the treatment options. She will continue amiodarone 200 bid and go to 200 daily starting in Sept. I will see her back in mid October. 2. Chronic systolic heart failure - her symptoms are class 2.  3. Obesity - she is encouraged to lose weight.   Mikle Bosworth.D.

## 2019-12-27 NOTE — Telephone Encounter (Signed)
Patient scheduled this afternoon.

## 2019-12-27 NOTE — Progress Notes (Signed)
Virtual Visit via Video Note  I connected with [name] on 12/27/19 at  3:00 PM EDT by a video enabled telemedicine application and verified that I am speaking with the correct person using two identifiers.  Location: Patient: Patient is at home setting had difficulty with virtual platform so did phone call patient is on Provider: In office setting   I discussed the limitations of evaluation and management by telemedicine and the availability of in person appointments. The patient expressed understanding and agreed to proceed.  History of Present Illness: 44 year old female with chronic back issues with a past medical history significant for congestive heart failure who is responding well to amiodarone.  Takes anti-inflammatories intermittently.  Has been doing fairly regularly well for the last couple years.  Feels like it does keep the pain at bay and allows her to continue to be active.Reviewed patient's labs recently which have been unremarkable patient's kidney function unremarkable.    Observations/Objective: Alert, seems in good spirits.  Once again difficulty with virtual platform   Assessment and Plan: 44 year old female with history of congestive heart failure with chronic back pain.  Refilled patient's Duexis and told her to use it sparingly.  Patient will discuss with her cardiologist again but has had good results with it previously.  Does have some difficulty with aspirin but has not had any problems with this Duexis.   Follow Up Instructions: Has an appointment in 2 to 3 weeks to see in office    I discussed the assessment and treatment plan with the patient. The patient was provided an opportunity to ask questions and all were answered. The patient agreed with the plan and demonstrated an understanding of the instructions.   The patient was advised to call back or seek an in-person evaluation if the symptoms worsen or if the condition fails to improve as  anticipated.  I provided *58minutes of non-face-to-face time during this encounter.   Lyndal Pulley, DO

## 2019-12-28 ENCOUNTER — Other Ambulatory Visit: Payer: Self-pay

## 2019-12-28 NOTE — Patient Outreach (Signed)
°  [facility]) Care Management Chronic Special Needs Program    12/28/2019  Name: [name], DOB: 06-02-1975  MRN: [id number]   [name] is enrolled in a chronic special needs plan. RNCM called to follow up. No answer. HIPAA compliant message left.   Plan: Chronic care management coordinator will attempt outreach within 1-2 weeks.  Thea Silversmith, RN, MSN, [place] [phone]

## 2020-01-01 ENCOUNTER — Other Ambulatory Visit: Payer: Self-pay

## 2020-01-01 NOTE — Patient Outreach (Signed)
  [facility]) Care Management Chronic Special Needs Program    01/01/2020  Name: [name], DOB: Mar 04, 1976  MRN: [id number]   [name] is enrolled in a chronic special needs plan for Heart Failure. RNCM called to follow up. No answer. HIPAA compliant message left. 2nd outreach attempt.  Plan: Chronic care management coordinator will attempt outreach within 1-2 weeks.   Thea Silversmith, RN, MSN, [place] [phone]

## 2020-01-04 ENCOUNTER — Ambulatory Visit: Payer: HMO | Admitting: Family Medicine

## 2020-01-04 NOTE — Progress Notes (Deleted)
[name] [place] Phone: [phone] Subjective:    I'm seeing this patient by the request  of:  Hoyt Koch, MD  CC:   [id number]   [birth year] -year-old female with history of congestive heart failure with chronic back pain.  Refilled patient's Duexis and told her to use it sparingly.  Patient will discuss with her cardiologist again but has had good results with it previously.  Does have some difficulty with aspirin but has not had any problems with this Duexis.  Update 01/04/2020 [name] is a 44 y.o. female coming in with complaint of right shoulder pain. Patient states        Past Medical History:  Diagnosis Date  . Acute on chronic systolic (congestive) heart failure ([place]) 04/29/2017  . Acute pain of right shoulder 06/16/2017  . AICD (automatic cardioverter/defibrillator) present   . Anxiety   . Arthritis    right shoulder   . Asthma   . CHF (congestive heart failure) ([place])   . Chronic combined systolic and diastolic heart failure ([place]) 03/05/2014  . Closed low lateral malleolus fracture 10/23/2013  . Cystitis 10/21/2017  . Depression   . Depression with anxiety 01/20/2013  . Diabetes mellitus without complication ([place])   . Diverticulosis   . Dyspnea    comes and goes intermittently mostly with exertion   . Essential hypertension    Prev followed by [facility]/ Cardiology   . Fibroid    age 58  . Gallstones   . Generalized abdominal cramping   . History of cardiomyopathy   . Hypertension   . IBS (irritable bowel syndrome)   . 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 Insomnia 05/07/2017  . Labral tear of shoulder 04/04/2015    Injected 04/04/2015 Injected 12/03/2015   . Migraine    "monthly" (08/03/2016)  . Myofascial pain 06/16/2017  . NICM (nonischemic cardiomyopathy) ([place]) 08/03/2016  . Nonallopathic lesion of lumbosacral region 11/16/2016  .  Nonallopathic lesion of sacral region 11/16/2016  . Nonallopathic lesion of thoracic region 08/20/2014  . Nonspecific chest pain 04/28/2017  . OSA (obstructive sleep apnea) 01/02/2013   NPSG 2009:  AHI 9/hr. CPAP intolerance >> "smothering" Good tolerance of auto device (optimal pressure 12-13 on download).  - referred to Dr Gwenette Greet    . OSA on CPAP   . Ovarian cyst    1999; surgically removed  . Patellofemoral syndrome of both knees 10/16/2016  . 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 PVC (premature ventricular contraction) 06/23/2016  . Seizures ([place])    "as a child" (08/03/2016)  . Termination of pregnancy    due to cardiac risk   Past Surgical History:  Procedure Laterality Date  . CARDIAC CATHETERIZATION N/A 11/11/2015   Procedure: Right/Left Heart Cath and Coronary Angiography;  Surgeon: Troy Sine, MD;  Location: [place] CV LAB;  Service: Cardiovascular;  Laterality: N/A;  . CARDIAC CATHETERIZATION  ~ 2015  . CARDIAC DEFIBRILLATOR PLACEMENT  08/03/2016  . CESAREAN SECTION  1999  . COLONOSCOPY WITH PROPOFOL N/A 04/21/2016   Procedure: COLONOSCOPY WITH PROPOFOL;  Surgeon: Jerene Bears, MD;  Location: [facility] ENDOS[facility];  Service: Gastroenterology;  Laterality: N/A;  . ESOPHAGOGASTRODUODENOSCOPY (EGD) WITH PROPOFOL N/A 04/21/2016   Procedure: ESOPHAGOGASTRODUODENOSCOPY (EGD) WITH PROPOFOL;  Surgeon: Jerene Bears, MD;  Location: [facility];  Service: Gastroenterology;  Laterality: N/A;  . FOOT FRACTURE SURGERY Right ~ 2003  .  FRACTURE SURGERY    . ICD IMPLANT N/A 08/03/2016   Procedure: ICD Implant;  Surgeon: Deboraha Sprang, MD;  Location: [place] CV LAB;  Service: Cardiovascular;  Laterality: N/A;  . LAPAROSCOPIC CHOLECYSTECTOMY  12/2006  . LAPAROSCOPIC GASTRIC SLEEVE RESECTION    . LAPAROSCOPY ABDOMEN DIAGNOSTIC  2008   "cut bile duct w/gallbladder OR; had to go in later & fix leak; hospitalized for 2 months"  . LEFT HEART CATHETERIZATION WITH CORONARY  ANGIOGRAM N/A 02/26/2014   Procedure: LEFT HEART CATHETERIZATION WITH CORONARY ANGIOGRAM;  Surgeon: Jettie Booze, MD;  [facility];  Service: Cardiovascular;  Laterality: N/A;  . OVARIAN CYST REMOVAL Right 1999  . PVC ABLATION N/A 12/01/2017   Procedure: PVC ABLATION;  Surgeon: Evans Lance, MD;  Location: [place] CV LAB;  Service: Cardiovascular;  Laterality: N/A;  . RIGHT HEART CATH N/A 08/05/2017   Procedure: RIGHT HEART CATH;  Surgeon: Jolaine Artist, MD;  Location: [place] CV LAB;  Service: Cardiovascular;  Laterality: N/A;  . RIGHT HEART CATH N/A 11/16/2019   Procedure: RIGHT HEART CATH;  Surgeon: Jolaine Artist, MD;  Location: [place] CV LAB;  Service: Cardiovascular;  Laterality: N/A;  . TUBAL LIGATION  1999   Social History   Socioeconomic History  . Marital status: Married    Spouse name: Not on file  . Number of children: 1  . Years of education: Not on file  . Highest education level: Not on file  Occupational History  . Occupation: stay at home mom  Tobacco Use  . Smoking status: Never Smoker  . Smokeless tobacco: Never Used  Vaping Use  . Vaping Use: Never used  Substance and Sexual Activity  . Alcohol use: No  . Drug use: No  . Sexual activity: Yes  Other Topics Concern  . Not on file  Social History Narrative  . Not on file   Social Determinants of Health   Financial Resource Strain:   . Difficulty of Paying Living Expenses:   Food Insecurity: No Food Insecurity  . Worried About Charity fundraiser in the Last Year: Never true  . Ran Out of Food in the Last Year: Never true  Transportation Needs: No Transportation Needs  . Lack of Transportation (Medical): No  . Lack of Transportation (Non-Medical): No  Physical Activity: Insufficiently Active  . Days of Exercise per Week: 2 days  . Minutes of Exercise per Session: 40 min  Stress:   . Feeling of Stress :   Social Connections: Socially Isolated  . Frequency of  Communication with Friends and Family: Twice a week  . Frequency of Social Gatherings with Friends and Family: Never  . Attends Religious Services: Never  . Active Member of Clubs or Organizations: No  . Attends Archivist Meetings: Never  . Marital Status: Married   Allergies  Allergen Reactions  . Vancomycin Other (See Comments)    "did something to my kidneys," PROGRESSED TO KIDNEY FAILURE!!  Marland Kitchen Aspirin Other (See Comments)    Wheezing, (Pt states that she just wheezes some when she takes aspirin by itself but she can take aspirin in a combination product).  . Contrast Media [Iodinated Diagnostic Agents] Other (See Comments)    Multiple CT contrast studies done over 2 weeks caused ARF  . Avocado Itching and Swelling  . Ciprofloxacin Itching and Rash  . Farxiga [Dapagliflozin] Rash  . Sulfa Antibiotics Itching and Rash   Family History  Problem  Relation Age of Onset  . Emphysema Maternal Grandmother        smoked  . Heart disease Maternal Grandmother 73       MI  . Rheum arthritis Mother   . Allergies Daughter   . Colon cancer Neg Hx     Current Outpatient Medications (Endocrine & Metabolic):  .  predniSONE (DELTASONE) 20 MG tablet, Take 2 tablets (40 mg total) by mouth daily with breakfast.  Current Outpatient Medications (Cardiovascular):  .  amiodarone (PACERONE) 200 MG tablet, Take 1 tablet (200 mg total) by mouth 2 (two) times daily for 27 days. Derrill Memo ON 01/24/2020] amiodarone (PACERONE) 200 MG tablet, Take 1 tablet (200 mg total) by mouth daily. .  carvedilol (COREG) 12.5 MG tablet, Take 1.5 tablets (18.75 mg total) by mouth 2 (two) times daily. .  colestipol (COLESTID) 1 g tablet, Take 2 g by mouth daily as needed (IBS). Marland Kitchen  ivabradine (CORLANOR) 7.5 MG TABS tablet, Take 7.5 mg by mouth 2 (two) times daily with a meal. .  nitroGLYCERIN (NITROSTAT) 0.4 MG SL tablet, Place 1 tablet (0.4 mg total) under the tongue every 5 (five) minutes as needed for chest  pain. .  sacubitril-valsartan (ENTRESTO) 97-103 MG, Take 1 tablet by mouth 2 (two) times daily. Marland Kitchen  torsemide (DEMADEX) 20 MG tablet, Take 1 tablet (20 mg total) by mouth daily.  Current Outpatient Medications (Respiratory):  .  albuterol (PROAIR HFA) 108 (90 Base) MCG/ACT inhaler, Inhale 1-2 puffs into the lungs every 6 (six) hours as needed for wheezing or shortness of breath. .  benzonatate (TESSALON) 200 MG capsule, Take 1 capsule (200 mg total) by mouth 3 (three) times daily as needed. .  fluticasone (FLONASE) 50 MCG/ACT nasal spray, Place 2 sprays into both nostrils daily as needed for allergies. Marland Kitchen  ipratropium-albuterol (DUONEB) 0.5-2.5 (3) MG/3ML SOLN, Take 3 mLs by nebulization every 6 (six) hours as needed. .  montelukast (SINGULAIR) 10 MG tablet, Take 1 tablet (10 mg total) by mouth at bedtime. .  SYMBICORT 160-4.5 MCG/ACT inhaler, INHALE 2 PUFFS INTO THE LUNGS 2 (TWO) TIMES DAILY AS NEEDED. (Patient taking differently: Inhale 2 puffs into the lungs 2 (two) times daily as needed (shortness of breath/wheezing). )  Current Outpatient Medications (Analgesics):  .  butalbital-acetaminophen-caffeine (FIORICET) 50-325-40 MG tablet, Take 1-2 tablets by mouth every 6 (six) hours as needed for headache. .  Ibuprofen-Famotidine (DUEXIS) 800-26.6 MG TABS, Take 1 tablet by mouth 2 (two) times daily as needed. Marland Kitchen  Ubrogepant (UBRELVY) 50 MG TABS, Take 50 mg by mouth every 2 (two) hours as needed (migraine). (Patient taking differently: Take 50 mg by mouth See admin instructions. Take one tablet (50 mg) by mouth at onset of migraine headache, may repeat in 30 minutes if still needed.)   Current Outpatient Medications (Other):  .  diclofenac sodium (VOLTAREN) 1 % GEL, Apply 4 g topically 4 (four) times daily. (Patient taking differently: Apply 4 g topically 4 (four) times daily as needed (pain). ) .  dicyclomine (BENTYL) 20 MG tablet, Take 20 mg by mouth daily as needed for spasms.  Marland Kitchen  escitalopram  (LEXAPRO) 10 MG tablet, Take 1 tablet (10 mg total) by mouth daily. Marland Kitchen  gabapentin (NEURONTIN) 100 MG capsule, Take 100 mg by mouth 2 (two) times daily as needed (back spasms during the day).  Marland Kitchen  gabapentin (NEURONTIN) 300 MG capsule, Take 300 mg by mouth at bedtime as needed (back spasms). .  lidocaine (LIDODERM) 5 %, Place  1 patch onto the skin daily. Remove & Discard patch within 12 hours or as directed by MD (Patient taking differently: Place 1 patch onto the skin 2 (two) times daily as needed (pain). Remove & Discard patch within 12 hours or as directed by MD) .  Magnesium Oxide 200 MG TABS, Take 1 tablet (200 mg total) by mouth daily. .  Multiple Vitamins-Minerals (MULTIVITAMIN GUMMIES WOMENS PO), Take 2 tablets by mouth at bedtime.  Marland Kitchen  omeprazole (PRILOSEC) 20 MG capsule, Take 20 mg by mouth daily as needed (for reflux symptoms).  .  potassium chloride (MICRO-K) 10 MEQ CR capsule, Take 4 capsules (40 mEq total) by mouth 2 (two) times daily. Marland Kitchen  tiZANidine (ZANAFLEX) 4 MG tablet, Take 4 mg by mouth every 8 (eight) hours as needed for muscle spasms.  .  Vitamin D, Ergocalciferol, (DRISDOL) 1.25 MG (50000 UT) CAPS capsule, Take 1 capsule (50,000 Units total) by mouth every 7 (seven) days. (Patient taking differently: Take 50,000 Units by mouth every Wednesday. )   Reviewed prior external information including notes and imaging from  primary care provider As well as notes that were available from care everywhere and other healthcare systems.  Past medical history, social, surgical and family history all reviewed in electronic medical record.  No pertanent information unless stated regarding to the chief complaint.   Review of Systems:  No headache, visual changes, nausea, vomiting, diarrhea, constipation, dizziness, abdominal pain, skin rash, fevers, chills, night sweats, weight loss, swollen lymph nodes, body aches, joint swelling, chest pain, shortness of breath, mood changes. POSITIVE muscle  aches  Objective  There were no vitals taken for this visit.   General: No apparent distress alert and oriented x3 mood and affect normal, dressed appropriately.  HEENT: Pupils equal, extraocular movements intact  Respiratory: [name] speak in full sentences and does not appear short of breath  Cardiovascular: No lower extremity edema, non tender, no erythema  Neuro: Cranial nerves II through XII are intact, neurovascularly intact in all extremities with 2+ DTRs and 2+ pulses.  Gait normal with good balance and coordination.  MSK:  Non tender with full range of motion and good stability and symmetric strength and tone of shoulders, elbows, wrist, hip, knee and ankles bilaterally.     Impression and Recommendations:     The above documentation has been reviewed and is accurate and complete [name]       Note: This dictation was prepared with Dragon dictation along with smaller phrase technology. Any transcriptional errors that result from this process are unintentional.

## 2020-01-08 DIAGNOSIS — R0602 Shortness of breath: Secondary | ICD-10-CM | POA: Diagnosis not present

## 2020-01-08 DIAGNOSIS — R062 Wheezing: Secondary | ICD-10-CM | POA: Diagnosis not present

## 2020-01-08 DIAGNOSIS — U071 COVID-19: Secondary | ICD-10-CM | POA: Diagnosis not present

## 2020-01-08 DIAGNOSIS — R05 Cough: Secondary | ICD-10-CM | POA: Diagnosis not present

## 2020-01-10 ENCOUNTER — Other Ambulatory Visit: Payer: Self-pay

## 2020-01-10 NOTE — Patient Outreach (Signed)
[facility]) Care Management Chronic Special Needs Program    01/10/2020  Name: [name], DOB: [birth year]  MRN: [id number]   [name] is enrolled in a chronic special needs plan for Heart Failure. RNCM called to follow up. No answer. HIPPA compliant message left. 3rd outreach attempt. Care plan updated based on available data.  Goals Addressed              This Visit's Progress   .  COMPLETED:  Acknowledge receipt of Advanced Directive package        Client does not want this as a goal and request to discontinue this goal.    .  COMPLETED: Advanced Care Planning complete as directed by client..        Client does not want this as a goal and request to discontinue this goal.    .  Client will report no worsening of symptoms related to heart disease within the next 6 months   On track     Please continue to attend provider visits as scheduled. Please continue to take medications as scheduled.     .  Client will verbalize knowledge of Heart Failure disease self management skills within the next 6 months. (pt-stated)   On track     Continue to take medications as prescribed. Continue to attend provider visits as scheduled. Know signs and symptoms of congestive heart failure exacerbation. Know when to call the doctor. Verbalize how fluid intake can affect congestive heart failure.-per provider limit all fluids to the day to less than 2 liters. Eat healthy and monitor salt intake-per provider limit salt to 2000 mg per day Weight daily and record. Take record to provider visits as scheduled.     .  Client will verbalize knowledge of self management of Hypertension as evidences by BP reading of 140/90 or less; or as defined by provider   On track     Blood pressure on 12/27/19 112/84 and blood pressure on 09/05/19 118/82 Continue to follow up with your doctor as scheduled. Take your medications as prescribed by your doctor. Ask your doctor "what is my  target blood pressure range". Monitor your blood pressure and take results to your doctor's appointment.  Monitor the amount of salt you are eating. Continue to exercise as tolerated and remain active. Eat heart healthy diet (full of fruits, vegetables, whole grains, lean protein, water--limit salt, fat, and sugar/simple carb intake).     .  COMPLETED: General - Client will not be readmitted within 30 days (C-SNP)-discharge date 11/17/2019        No readmissions.     .  Obtain annual  Lipid Profile, LDL-C   On track     Continue to attend provider visits as scheduled.. It is important to follow up with your provider for recommended test.  Try to avoid saturated fats, trans-fats, and eat more fiber.     .  Patient Stated: "feel better"   On track     [facility] social worker has made several attempts to contact you. Please call your RN care coordinator if you have any questions. Please call your doctors as needed.  Attend follow up visits as scheduled.     .  COMPLETED: Visit Primary Care Provider or Cardiologist at least 2 times per year        Completed Primary care provider visit 11/30/19 and 09/05/19 Completed Electro physiologist visit on 12/27/19 Completed Cardiologist 12/05/19 and 11/23/19.  Plan: send updated care plan to client. Send updated care plan to primary care provider. Outreach per tier level within the next 3 months.  Thea Silversmith, RN, MSN, [place] [phone]

## 2020-01-11 ENCOUNTER — Telehealth (HOSPITAL_COMMUNITY): Payer: Self-pay | Admitting: Cardiology

## 2020-01-11 DIAGNOSIS — I5042 Chronic combined systolic (congestive) and diastolic (congestive) heart failure: Secondary | ICD-10-CM

## 2020-01-11 NOTE — Telephone Encounter (Signed)
-----   Message from Conrad Norco, NP sent at 01/08/2020 10:49 AM EDT ----- Mild/mod heart failure limitations. I would like to refer to PREP program.

## 2020-01-11 NOTE — Telephone Encounter (Signed)
As requested order placed for PREP program

## 2020-01-14 ENCOUNTER — Other Ambulatory Visit: Payer: Self-pay

## 2020-01-14 ENCOUNTER — Ambulatory Visit (HOSPITAL_BASED_OUTPATIENT_CLINIC_OR_DEPARTMENT_OTHER): Payer: HMO | Attending: Cardiology | Admitting: Cardiology

## 2020-01-14 DIAGNOSIS — Z79899 Other long term (current) drug therapy: Secondary | ICD-10-CM | POA: Diagnosis not present

## 2020-01-14 DIAGNOSIS — G4733 Obstructive sleep apnea (adult) (pediatric): Secondary | ICD-10-CM

## 2020-01-14 DIAGNOSIS — R0683 Snoring: Secondary | ICD-10-CM | POA: Diagnosis not present

## 2020-01-14 DIAGNOSIS — I509 Heart failure, unspecified: Secondary | ICD-10-CM | POA: Insufficient documentation

## 2020-01-14 DIAGNOSIS — I110 Hypertensive heart disease with heart failure: Secondary | ICD-10-CM | POA: Insufficient documentation

## 2020-01-14 DIAGNOSIS — I493 Ventricular premature depolarization: Secondary | ICD-10-CM | POA: Insufficient documentation

## 2020-01-15 ENCOUNTER — Ambulatory Visit (INDEPENDENT_AMBULATORY_CARE_PROVIDER_SITE_OTHER): Payer: HMO

## 2020-01-15 DIAGNOSIS — I5042 Chronic combined systolic (congestive) and diastolic (congestive) heart failure: Secondary | ICD-10-CM

## 2020-01-15 DIAGNOSIS — Z95810 Presence of automatic (implantable) cardiac defibrillator: Secondary | ICD-10-CM | POA: Diagnosis not present

## 2020-01-15 NOTE — Procedures (Signed)
    Patient Name: Megan, [name] Study Date:05/11/2017 01/14/2020 <br> <br> Gender: Female D.O.B: [birth year] Age (years): 43 Referring Provider: Loralie Champagne Height (inches): 87 Interpreting Physician: Fransico Him MD, ABSM Weight (lbs): 219 RPSGT: Gwenyth Allegra BMI: 30 MRN: [id number] Neck Size: 15.00 <br> <br> CLINICAL INFORMATION Sleep Study Type: NPSG  Indication for sleep study: Congestive Heart Failure, Hypertension, OSA  Epworth Sleepiness Score: 15  SLEEP STUDY TECHNIQUE As per the AASM Manual for the Scoring of Sleep and Associated Events v2.3 (April 2016) with a hypopnea requiring 4% desaturations.  The channels recorded and monitored were frontal, central and occipital EEG, electrooculogram (EOG), submentalis EMG (chin), nasal and oral airflow, thoracic and abdominal wall motion, anterior tibialis EMG, snore microphone, electrocardiogram, and pulse oximetry.  MEDICATIONS Medications self-administered by patient taken the night of the study : GABAPENTIN  SLEEP ARCHITECTURE The study was initiated at 10:23:18 PM and ended at 4:29:55 AM.  Sleep onset time was 141.4 minutes and the sleep efficiency was 52.9%. The total sleep time was 194 minutes.  Stage REM latency was 105.5 minutes.  The patient spent 5.4% of the night in stage N1 sleep, 86.3% in stage N2 sleep, 0.0% in stage N3 and 8.3% in REM.  Alpha intrusion was absent.  Supine sleep was 38.40%.  RESPIRATORY PARAMETERS The overall apnea/hypopnea index (AHI) was 3.1 per hour. There were 0 total apneas, including 0 obstructive, 0 central and 0 mixed apneas. There were 10 hypopneas and 4 RERAs.  The AHI during Stage REM sleep was 26.3 per hour.  AHI while supine was 2.4 per hour.  The mean oxygen saturation was 93.6%. The minimum SpO2 during sleep was 87.0%.  moderate snoring was noted during this study.  CARDIAC DATA The 2 lead EKG demonstrated sinus rhythm. The mean heart rate was 80.5 beats per  minute. Other EKG findings include: PVCs, bigeminal PVCs, ventricular couplets and salvos.  LEG MOVEMENT DATA The total PLMS were 0 with a resulting PLMS index of 0.0. Associated arousal with leg movement index was 0.0 .  IMPRESSIONS - No significant obstructive sleep apnea occurred during this study (AHI = 3.1/h). - No significant central sleep apnea occurred during this study (CAI = 0.0/h). - Mild oxygen desaturation was noted during this study (Min O2 = 87.0%). - The patient snored with moderate snoring volume. - EKG findings include PVCs, bigeminal PVCs, ventricular couplets and salvos. - Clinically significant periodic limb movements did not occur during sleep. No significant associated arousals.  DIAGNOSIS - Normal Study - PVCs, bigeminal PVCs, ventricular couplets and salvos.   RECOMMENDATIONS - Avoid alcohol, sedatives and other CNS depressants that may worsen sleep apnea and disrupt normal sleep architecture. - Sleep hygiene should be reviewed to assess factors that may improve sleep quality. - Weight management and regular exercise should be initiated or continued if appropriate.  [Electronically signed] 01/15/2020 11:08 AM  Fransico Him MD, ABSM Diplomate, American Board of Sleep Medicine

## 2020-01-16 ENCOUNTER — Telehealth: Payer: Self-pay

## 2020-01-16 ENCOUNTER — Telehealth: Payer: Self-pay | Admitting: *Deleted

## 2020-01-16 DIAGNOSIS — I493 Ventricular premature depolarization: Secondary | ICD-10-CM

## 2020-01-16 NOTE — Telephone Encounter (Signed)
Remote ICM transmission received.  Attempted call to patient regarding ICM remote transmission and left detailed message per DPR to return call.  Advised to return call for any fluid symptoms or questions.      

## 2020-01-16 NOTE — Telephone Encounter (Signed)
Patient Name: [name]        DOB:  07/22/1975      Height: 5'6"    YWVPXT:062 lb  Office Name:[facility]         Referring Provider:TRACI TURNER  Today's Date:01/16/20 Date:   STOP BANG RISK ASSESSMENT S (snore) Have you been told that you snore?     NO   T (tired) Are you often tired, fatigued, or sleepy during the day?   NO  O (obstruction) Do you stop breathing, choke, or gasp during sleep? NO   P (pressure) Do you have or are you being treated for high blood pressure? YES   B (BMI) Is your body index greater than 35 kg/m? YES   A (age) Are you 20 years old or older? NO   N (neck) Do you have a neck circumference greater than 16 inches?   NO   G (gender) Are you a female? NO   TOTAL STOP/BANG "YES" ANSWERS                                                                        For Office Use Only              Procedure Order Form    YES to 3+ Stop Bang questions OR two clinical symptoms - patient qualifies for WatchPAT (CPT 95800)     Submit: This Form + Patient Face Sheet + Clinical Note via CloudPAT or Fax: [phone]         Clinical Notes: Will consult Sleep Specialist and refer for management of therapy due to patient increased risk of Sleep Apnea. Ordering a sleep study due to the following two clinical symptoms: Excessive daytime sleepiness G47.10 / Gastroesophageal reflux K21.9 / Nocturia R35.1 / Morning Headaches G44.221 / Difficulty concentrating R41.840 / Memory problems or poor judgment G31.84 / Personality changes or irritability R45.4 / Loud snoring R06.83 / Depression F32.9 / Unrefreshed by sleep G47.8 / Impotence N52.9 / History of high blood pressure R03.0 / Insomnia G47.00    I understand that I am proceeding with a home sleep apnea test as ordered by my treating physician. I understand that untreated sleep apnea is a serious cardiovascular risk factor and it is my responsibility to perform the test and seek management for sleep apnea. I will be  contacted with the results and be managed for sleep apnea by a local sleep physician. I will be receiving equipment and further instructions from [facility]. I shall promptly ship back the equipment via the included mailing label. I understand my insurance will be billed for the test and as the patient I am responsible for any insurance related out-of-pocket costs incurred. I have been provided with written instructions and can call for additional video or telephonic instruction, with 24-hour availability of qualified personnel to answer any questions: Patient Help Desk [phone].  Patient Signature [name]_____________________________________________________   Date____8/24/21__________________ Patient Telemedicine Verbal Consent

## 2020-01-16 NOTE — Telephone Encounter (Signed)
ITAMAR study faxed electronically, with the stop bang questionnaire, insurance and demographics.

## 2020-01-16 NOTE — Telephone Encounter (Signed)
Patient  Called back to get her results. Informed patient of sleep study results and patient understanding was verbalized.  Patient understands her sleep study showed showed no significant sleep apnea but she had very little REM sleep. She has a lot of PVCs so I am still concerned about OSA. Please order an Itamar home sleep study  Pt is aware and agreeable to her results.  itamar sleep study ordered

## 2020-01-16 NOTE — Telephone Encounter (Signed)
-----   Message from Sueanne Margarita, MD sent at 01/15/2020 11:11 AM EDT ----- Gae Bon, Please let patient know that sleep study showed no significant sleep apnea but she had very little REM sleep.  She has a lot of PVCs so I am still concerned about OSA.  Please order an Itamar home sleep study

## 2020-01-16 NOTE — Addendum Note (Signed)
Addended by: Freada Bergeron on: 01/16/2020 02:32 PM   Modules accepted: Orders

## 2020-01-16 NOTE — Progress Notes (Signed)
EPIC Encounter for ICM Monitoring  Patient Name: [name] is a 44 y.o. female Date: 01/16/2020 Primary Care Physican: Hoyt Koch, MD Primary Cardiologist:Bensimhon Electrophysiologist: Caryl Comes 12/27/2019 Office Weight: 219 lbs  Attempted call to patient and unable to reach.  Left detailed message per DPR regarding transmission. Transmission reviewed.   8/21/2021HeartlogicHFIndex35 suggesting possible fluid accumulation since 01/05/2020.  Prescribed:  Torsemide20 mgTake1tablet (20 mg total) by mouthdaily.  Potassium 10 mEq take4tablets(40 mEq total)twice a day.  Labs: 12/05/2019 Creatinine 0.88, BUN 9, Potassium 3.6, Sodium 137, GFR >60 11/28/2019 Creatinine 0.90, BUN 6, Potassium 3.5, Sodium 137, GFR >60  11/27/2019 Creatinine 0.79, BUN 9, Potassium 3.4, Sodium 137, GFR >60  11/23/2019 Creatinine 0.84, BUN 9, Potassium 4.1, Sodium 138, GFR >60  11/17/2019 Creatinine1.04, BUN15, Potassium4.9, Sodium134, GFR>60 11/16/2019 Creatinine1.20, BUN21, Potassium4.4, Sodium135, GFR55->60  11/15/2019 Creatinine1.81, BUN28, Potassium4.0, Sodium135, [id number]  11/14/2019 Creatinine1.77, BUN20, Potassium4.0, Sodium136, GFR34-40  11/13/2019 Creatinine0.81, BUN6, Potassium 3.2, Sodium134, GFR>60  11/12/2019 Creatinine0.77, BUN8, Potassium 3.5, Sodium138, GFR>60  09/07/2019 Creatinine0.74, BUN9, Potassium 4.2, Sodium142, [id number] A complete set of results can be found in Results Review.  Recommendations:Left voice mail with ICM number and encouraged to call if experiencing any fluid symptoms.  Follow-up plan: ICM clinic phone appointment on8/31/2021 to recheck fluid levels. 91 day device clinic remote transmission10/26/2021.             EP/Cardiology next office visit: 01/18/2020 with [facility] PA/NP.  03/12/2020 with Dr. Lovena Le         Copy of ICM  check sent to Dr. Caryl Comes and Dr Haroldine Laws for review  3 Month Trend     [place], RN 01/16/2020 10:10 AM

## 2020-01-16 NOTE — Telephone Encounter (Signed)
Called results lmtcb. 

## 2020-01-17 ENCOUNTER — Telehealth: Payer: Self-pay

## 2020-01-17 ENCOUNTER — Ambulatory Visit (INDEPENDENT_AMBULATORY_CARE_PROVIDER_SITE_OTHER): Payer: HMO | Admitting: Family Medicine

## 2020-01-17 ENCOUNTER — Other Ambulatory Visit: Payer: Self-pay

## 2020-01-17 ENCOUNTER — Encounter: Payer: Self-pay | Admitting: Family Medicine

## 2020-01-17 VITALS — BP 116/92 | Ht 65.0 in | Wt 219.0 lb

## 2020-01-17 DIAGNOSIS — G8929 Other chronic pain: Secondary | ICD-10-CM

## 2020-01-17 DIAGNOSIS — M999 Biomechanical lesion, unspecified: Secondary | ICD-10-CM | POA: Diagnosis not present

## 2020-01-17 DIAGNOSIS — M545 Low back pain, unspecified: Secondary | ICD-10-CM

## 2020-01-17 MED ORDER — DUEXIS 800-26.6 MG PO TABS
1.0000 | ORAL_TABLET | Freq: Three times a day (TID) | ORAL | 3 refills | Status: DC | PRN
Start: 2020-01-17 — End: 2020-04-09

## 2020-01-17 MED ORDER — KETOROLAC TROMETHAMINE 30 MG/ML IJ SOLN
30.0000 mg | Freq: Once | INTRAMUSCULAR | Status: AC
  Administered 2020-01-17: 30 mg via INTRAMUSCULAR

## 2020-01-17 NOTE — Patient Outreach (Signed)
  [facility]) Care Management Chronic Special Needs Program    01/17/2020  Name: [name], DOB: Sep 09, 1975  MRN: [id number]   [name] is enrolled in a chronic special needs plan for Heart Failure.  Care coordination: RNCM received call from [facility] pharmacist to clarify referral. Pharmacist to call to discuss pill packaging with client and provide list of pharmacies that offer that service.  Plan: continue to follow.  Thea Silversmith, RN, MSN, [place] [phone]

## 2020-01-17 NOTE — Progress Notes (Signed)
[place] Phone: [phone] Subjective:   [name], am serving as a scribe for Dr. Hulan Saas. This visit occurred during the SARS-CoV-2 public health emergency.  Safety protocols were in place, including screening questions prior to the visit, additional usage of staff PPE, and extensive cleaning of exam room while observing appropriate contact time as indicated for disinfecting solutions.   I'm seeing this patient by the request  of:  Hoyt Koch, MD  CC: Low back pain  FAO:[id number]   12/27/2019 44 year old female with history of congestive heart failure with chronic back pain.  Refilled patient's Duexis and told her to use it sparingly.  Patient will discuss with her cardiologist again but has had good results with it previously.  Does have some difficulty with aspirin but has not had any problems with this Duexis.   Update 01/17/2020 [name] is a 44 y.o. female coming in with complaint of low back pain. Patient states that her pain has progressively gotten worse over past year. Is using memory foam topper and pillow in between her knees. Pain increases during the day. Using tennis ball and golf ball for trigger point relief.  Patient was doing very well with the Duexis but has not had a refill in quite some time.  Starting to have pain that does affect daily activities,    Past Medical History:  Diagnosis Date  . Acute on chronic systolic (congestive) heart failure ([place]) 04/29/2017  . Acute pain of right shoulder 06/16/2017  . AICD (automatic cardioverter/defibrillator) present   . Anxiety   . Arthritis    right shoulder   . Asthma   . CHF (congestive heart failure) ([place])   . Chronic combined systolic and diastolic heart failure ([place]) 03/05/2014  . Closed low lateral malleolus fracture 10/23/2013  . Cystitis 10/21/2017  . Depression   . Depression with anxiety 01/20/2013  . Diabetes  mellitus without complication ([place])   . Diverticulosis   . Dyspnea    comes and goes intermittently mostly with exertion   . Essential hypertension    Prev followed by [facility] Chais Fehringer/ Cardiology   . Fibroid    age 68  . Gallstones   . Generalized abdominal cramping   . History of cardiomyopathy   . Hypertension   . IBS (irritable bowel syndrome)   . 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 Insomnia 05/07/2017  . Labral tear of shoulder 04/04/2015    Injected 04/04/2015 Injected 12/03/2015   . Migraine    "monthly" (08/03/2016)  . Myofascial pain 06/16/2017  . NICM (nonischemic cardiomyopathy) ([place] [place]) 08/03/2016  . Nonallopathic lesion of lumbosacral region 11/16/2016  . Nonallopathic lesion of sacral region 11/16/2016  . Nonallopathic lesion of thoracic region 08/20/2014  . Nonspecific chest pain 04/28/2017  . OSA (obstructive sleep apnea) 01/02/2013   NPSG 2009:  AHI 9/hr. CPAP intolerance >> "smothering" Good tolerance of auto device (optimal pressure 12-13 on download).  - referred to Dr Gwenette Greet    . OSA on CPAP   . Ovarian cyst    1999; surgically removed  . Patellofemoral syndrome of both knees 10/16/2016  . 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 PVC (premature ventricular contraction) 06/23/2016  . Seizures ([place])    "as a child" (08/03/2016)  . Termination of pregnancy    due to cardiac risk   Past Surgical History:  Procedure Laterality Date  .  CARDIAC CATHETERIZATION N/A 11/11/2015   Procedure: Right/Left Heart Cath and Coronary Angiography;  Surgeon: Troy Sine, MD;  Location: [place] [facility];  Service: Cardiovascular;  Laterality: N/A;  . CARDIAC CATHETERIZATION  ~ 2015  . CARDIAC DEFIBRILLATOR PLACEMENT  08/03/2016  . CESAREAN SECTION  1999  . COLONOSCOPY WITH PROPOFOL N/A 04/21/2016   Procedure: COLONOSCOPY WITH PROPOFOL;  Surgeon: Jerene Bears, MD;  Location: [facility];  Service: Gastroenterology;  Laterality:  N/A;  . ESOPHAGOGASTRODUODENOSCOPY (EGD) WITH PROPOFOL N/A 04/21/2016   Procedure: ESOPHAGOGASTRODUODENOSCOPY (EGD) WITH PROPOFOL;  Surgeon: Jerene Bears, MD;  Location: [facility];  Service: Gastroenterology;  Laterality: N/A;  . FOOT FRACTURE SURGERY Right ~ 2003  . FRACTURE SURGERY    . ICD IMPLANT N/A 08/03/2016   Procedure: ICD Implant;  Surgeon: Deboraha Sprang, MD;  Location: [place] [facility];  Service: Cardiovascular;  Laterality: N/A;  . LAPAROSCOPIC CHOLECYSTECTOMY  12/2006  . LAPAROSCOPIC GASTRIC SLEEVE RESECTION    . LAPAROSCOPY ABDOMEN DIAGNOSTIC  2008   "cut bile duct w/gallbladder OR; had to go in later & fix leak; hospitalized for 2 months"  . LEFT HEART CATHETERIZATION WITH CORONARY ANGIOGRAM N/A 02/26/2014   Procedure: LEFT HEART CATHETERIZATION WITH CORONARY ANGIOGRAM;  Surgeon: Jettie Booze, MD;  Location[facility] C[facility] LAB;  Service: Cardiovascular;  Laterality: N/A;  . OVARIAN CYST REMOVAL Right 1999  . PVC ABLATION N/A 12/01/2017   Procedure: PVC ABLATION;  Surgeon: Evans Lance, MD;  Location: [place] [facility];  Service: Cardiovascular;  Laterality: N/A;  . RIGHT HEART CATH N/A 08/05/2017   Procedure: RIGHT HEART CATH;  Surgeon: Jolaine Artist, MD;  Location: [place] [facility];  Service: Cardiovascular;  Laterality: N/A;  . RIGHT HEART CATH N/A 11/16/2019   Procedure: RIGHT HEART CATH;  Surgeon: Jolaine Artist, MD;  Location: [place] [facility];  Service: Cardiovascular;  Laterality: N/A;  . TUBAL LIGATION  1999   Social History   Socioeconomic History  . Marital status: Married    Spouse name: Not on file  . Number of children: 1  . Years of education: Not on file  . Highest education level: Not on file  Occupational History  . Occupation: stay at home mom  Tobacco Use  . Smoking status: Never Smoker  . Smokeless tobacco: Never Used  Vaping Use  . Vaping Use: Never used  Substance and Sexual Activity  . Alcohol use: [name]  . Drug use: [name]  .  Sexual activity: Yes  Other Topics Concern  . Not on file  Social History Narrative  . Not on file   Social Determinants of Health   Financial Resource Strain:   . Difficulty of Paying Living Expenses: Not on file  Food Insecurity: [name] Food Insecurity  . Worried About Charity fundraiser in the Last Year: Never true  . Ran Out of Food in the Last Year: Never true  Transportation Needs: [name] Transportation Needs  . Lack of Transportation (Medical): [name]  . Lack of Transportation (Non-Medical): [name]  Physical Activity: Insufficiently Active  . Days of Exercise per Week: 2 days  . Minutes of Exercise per Session: 40 min  Stress:   . Feeling of Stress : Not on file  Social Connections: Socially Isolated  . Frequency of Communication with Friends and Family: Twice a week  . Frequency of Social Gatherings with Friends and Family: Never  . Attends Religious Services: Never  . Active Member of Clubs  or Organizations: [name]  . Attends Archivist Meetings: Never  . Marital Status: Married   Allergies  Allergen Reactions  . Vancomycin Other (See Comments)    "did something to my kidneys," PROGRESSED TO KIDNEY FAILURE!!  Marland Kitchen Aspirin Other (See Comments)    Wheezing, (Pt states that she just wheezes some when she takes aspirin by itself but she can take aspirin in a combination product).  . Contrast Media [Iodinated Diagnostic Agents] Other (See Comments)    Multiple CT contrast studies done over 2 weeks caused ARF  . Avocado Itching and Swelling  . Ciprofloxacin Itching and Rash  . Farxiga [Dapagliflozin] Rash  . Sulfa Antibiotics Itching and Rash   Family History  Problem Relation Age of Onset  . Emphysema Maternal Grandmother        smoked  . Heart disease Maternal Grandmother 33       MI  . Rheum arthritis Mother   . Allergies Daughter   . Colon cancer Neg Hx     Current Outpatient Medications (Endocrine & Metabolic):  .  predniSONE (DELTASONE) 20 MG tablet, Take 2 tablets  (40 mg total) by mouth daily with breakfast.  Current Outpatient Medications (Cardiovascular):  .  amiodarone (PACERONE) 200 MG tablet, Take 1 tablet (200 mg total) by mouth 2 (two) times daily for 27 days. Derrill Memo ON 01/24/2020] amiodarone (PACERONE) 200 MG tablet, Take 1 tablet (200 mg total) by mouth daily. .  colestipol (COLESTID) 1 g tablet, Take 2 g by mouth daily as needed (IBS). Marland Kitchen  ivabradine (CORLANOR) 7.5 MG TABS tablet, Take 7.5 mg by mouth 2 (two) times daily with a meal. .  nitroGLYCERIN (NITROSTAT) 0.4 MG SL tablet, Place 1 tablet (0.4 mg total) under the tongue every 5 (five) minutes as needed for chest pain. .  sacubitril-valsartan (ENTRESTO) 97-103 MG, Take 1 tablet by mouth 2 (two) times daily. .  carvedilol (COREG) 12.5 MG tablet, Take 1.5 tablets (18.75 mg total) by mouth 2 (two) times daily. Marland Kitchen  torsemide (DEMADEX) 20 MG tablet, Take 1 tablet (20 mg total) by mouth daily.  Current Outpatient Medications (Respiratory):  .  albuterol (PROAIR HFA) 108 (90 Base) MCG/ACT inhaler, Inhale 1-2 puffs into the lungs every 6 (six) hours as needed for wheezing or shortness of breath. .  fluticasone (FLONASE) 50 MCG/ACT nasal spray, Place 2 sprays into both nostrils daily as needed for allergies. .  montelukast (SINGULAIR) 10 MG tablet, Take 1 tablet (10 mg total) by mouth at bedtime. .  SYMBICORT 160-4.5 MCG/ACT inhaler, INHALE 2 PUFFS INTO THE LUNGS 2 (TWO) TIMES DAILY AS NEEDED. (Patient taking differently: Inhale 2 puffs into the lungs 2 (two) times daily as needed (shortness of breath/wheezing). ) .  benzonatate (TESSALON) 200 MG capsule, Take 1 capsule (200 mg total) by mouth 3 (three) times daily as needed. Marland Kitchen  ipratropium-albuterol (DUONEB) 0.5-2.5 (3) MG/3ML SOLN, Take 3 mLs by nebulization every 6 (six) hours as needed.  Current Outpatient Medications (Analgesics):  .  butalbital-acetaminophen-caffeine (FIORICET) 50-325-40 MG tablet, Take 1-2 tablets by mouth every 6 (six) hours  as needed for headache. .  Ibuprofen-Famotidine (DUEXIS) 800-26.6 MG TABS, Take 1 tablet by mouth 2 (two) times daily as needed. Marland Kitchen  Ubrogepant (UBRELVY) 50 MG TABS, Take 50 mg by mouth every 2 (two) hours as needed (migraine). (Patient taking differently: Take 50 mg by mouth See admin instructions. Take one tablet (50 mg) by mouth at onset of migraine headache, may repeat in 30  minutes if still needed.) .  Ibuprofen-Famotidine (DUEXIS) 800-26.6 MG TABS, Take 1 tablet by mouth 3 (three) times daily as needed.   Current Outpatient Medications (Other):  .  diclofenac sodium (VOLTAREN) 1 % GEL, Apply 4 g topically 4 (four) times daily. (Patient taking differently: Apply 4 g topically 4 (four) times daily as needed (pain). ) .  dicyclomine (BENTYL) 20 MG tablet, Take 20 mg by mouth daily as needed for spasms.  Marland Kitchen  escitalopram (LEXAPRO) 10 MG tablet, Take 1 tablet (10 mg total) by mouth daily. Marland Kitchen  gabapentin (NEURONTIN) 300 MG capsule, Take 300 mg by mouth at bedtime as needed (back spasms). .  lidocaine (LIDODERM) 5 %, Place 1 patch onto the skin daily. Remove & Discard patch within 12 hours or as directed by MD (Patient taking differently: Place 1 patch onto the skin 2 (two) times daily as needed (pain). Remove & Discard patch within 12 hours or as directed by MD) .  Magnesium Oxide 200 MG TABS, Take 1 tablet (200 mg total) by mouth daily. .  potassium chloride (MICRO-K) 10 MEQ CR capsule, Take 4 capsules (40 mEq total) by mouth 2 (two) times daily. Marland Kitchen  tiZANidine (ZANAFLEX) 4 MG tablet, Take 4 mg by mouth every 8 (eight) hours as needed for muscle spasms.  .  Vitamin D, Ergocalciferol, (DRISDOL) 1.25 MG (50000 UT) CAPS capsule, Take 1 capsule (50,000 Units total) by mouth every 7 (seven) days. (Patient taking differently: Take 50,000 Units by mouth every Wednesday. ) .  gabapentin (NEURONTIN) 100 MG capsule, Take 100 mg by mouth 2 (two) times daily as needed (back spasms during the day).  .  Multiple  Vitamins-Minerals (MULTIVITAMIN GUMMIES WOMENS PO), Take 2 tablets by mouth at bedtime.  Marland Kitchen  omeprazole (PRILOSEC) 20 MG capsule, Take 20 mg by mouth daily as needed (for reflux symptoms).    Reviewed prior external information including notes and imaging from  primary care provider As well as notes that were available from care everywhere and other healthcare systems.  Past medical history, social, surgical and family history all reviewed in electronic medical record.  [name] pertanent information unless stated regarding to the chief complaint.   Review of Systems:  [name] headache, visual changes, nausea, vomiting, diarrhea, constipation, dizziness, abdominal pain, skin rash, fevers, chills, night sweats, weight loss, swollen lymph nodes, body aches, joint swelling, chest pain, shortness of breath, mood changes. POSITIVE muscle aches  Objective  Blood pressure (!) 116/92, height 5\' 5"  (1.651 m), weight 219 lb (99.3 kg).   General: [name] apparent distress alert and oriented x3 mood and affect normal, dressed appropriately.  Patient has lost weight since previous exam HEENT: Pupils equal, extraocular movements intact  Respiratory: [name] speak in full sentences and does not appear short of breath  Cardiovascular: [name] lower extremity edema, non tender, [name] erythema  Neuro: Cranial nerves II through XII are intact, neurovascularly intact in all extremities with 2+ DTRs and 2+ pulses.  Gait normal with good balance and coordination.  MSK:  tender with full range of motion and good stability and symmetric strength and tone of shoulders, elbows, wrist, hip, knee and ankles bilaterally.  Mild pain out of proportion to the amount of palpation in multiple areas Low back exam does have some mild loss of lordosis, tender to palpation diffusely of the lower back.  Mild tightness with FABER test.   Osteopathic findings  C6 flexed rotated and side bent left T3 extended rotated and side bent right inhaled third  rib T9 extended rotated and side bent left L2 flexed rotated and side bent right Sacrum right on right    Impression and Recommendations:     The above documentation has been reviewed and is accurate and complete Lyndal Pulley, DO    Decision today to treat with OMT was based on Physical Exam  After verbal consent patient was treated with HVLA, ME, FPR techniques in cervical, thoracic, rib, lumbar and sacral areas, all areas are chronic   Patient tolerated the procedure well with improvement in symptoms  Patient given exercises, stretches and lifestyle modifications  See medications in patient instructions if given  Patient will follow up in 4-8 weeks     Note: This dictation was prepared with Dragon dictation along with smaller phrase technology. Any transcriptional errors that result from this process are unintentional.

## 2020-01-17 NOTE — Assessment & Plan Note (Signed)
Chronic problem with exacerbation.  Refill the Duexis at this time.  Warned about the medication being in blood thinner.  Place and the heart has been doing very well.  Patient given a sample of the medication as well today.  Toradol given today to help with some of the pain.  Increase activity slowly.  Follow-up with me again in 4 to 8 weeks.  Patient has done very well with weight loss as well

## 2020-01-17 NOTE — Telephone Encounter (Signed)
Received referral from HF clinic reference referral to PREP.  VMT pt requesting call back to discuss.

## 2020-01-17 NOTE — Patient Instructions (Addendum)
Duexis called into pharmacy Hip flexor exercises  See me again in 6 weeks

## 2020-01-18 ENCOUNTER — Encounter (HOSPITAL_COMMUNITY): Payer: HMO

## 2020-01-19 ENCOUNTER — Telehealth: Payer: Self-pay

## 2020-01-19 NOTE — Telephone Encounter (Signed)
LVMT requesting call back to discuss next start date for PREP 02/06/20 T/TH 1p-215p at [facility].

## 2020-01-22 ENCOUNTER — Ambulatory Visit (INDEPENDENT_AMBULATORY_CARE_PROVIDER_SITE_OTHER): Payer: HMO

## 2020-01-22 ENCOUNTER — Telehealth: Payer: Self-pay

## 2020-01-22 DIAGNOSIS — Z95810 Presence of automatic (implantable) cardiac defibrillator: Secondary | ICD-10-CM

## 2020-01-22 DIAGNOSIS — I5042 Chronic combined systolic (congestive) and diastolic (congestive) heart failure: Secondary | ICD-10-CM

## 2020-01-22 NOTE — Progress Notes (Signed)
EPIC Encounter for ICM Monitoring  Patient Name: [name] is a 44 y.o. female Date: 01/22/2020 Primary Care Physican: Hoyt Koch, MD Primary Cardiologist:Bensimhon Electrophysiologist: Caryl Comes 12/27/2019 Office Weight: 219 lbs  Attempted call to patient and unable to reach.  Left detailed message per DPR regarding transmission. Transmission reviewed.   8/28/2021HeartlogicHFIndexremains elevated at 36 suggesting possible fluid accumulation since 01/05/2020.  Prescribed:  Torsemide20 mgTake1tablet (20 mg total) by mouthdaily.  Potassium10 mEq take4tablets(40 mEq total)twice a day.  Labs: 12/05/2019 Creatinine 0.88, BUN 9, Potassium 3.6, Sodium 137, GFR >60 11/28/2019 Creatinine 0.90, BUN 6, Potassium 3.5, Sodium 137, GFR >60  11/27/2019 Creatinine 0.79, BUN 9, Potassium 3.4, Sodium 137, GFR >60  11/23/2019 Creatinine 0.84, BUN 9, Potassium 4.1, Sodium 138, GFR >60  11/17/2019 Creatinine1.04, BUN15, Potassium4.9, Sodium134, GFR>60 11/16/2019 Creatinine1.20, BUN21, Potassium4.4, Sodium135, GFR55->60  11/15/2019 Creatinine1.81, BUN28, Potassium4.0, Sodium135, POE42-35  11/14/2019 Creatinine1.77, BUN20, Potassium4.0, Sodium136, GFR34-40  11/13/2019 Creatinine0.81, BUN6, Potassium 3.2, Sodium134, GFR>60  11/12/2019 Creatinine0.77, BUN8, Potassium 3.5, Sodium138, GFR>60  09/07/2019 Creatinine0.74, BUN9, Potassium 4.2, Sodium142, TIR44-315 A complete set of results can be found in Results Review.  Recommendations:Left voice mail with ICM number and encouraged to call if experiencing any fluid symptoms.  Follow-up plan: ICM clinic phone appointment on9/13/2021 to recheck fluid levels. 91 day device clinic remote transmission10/26/2021.   EP/Cardiology next office visit: Patient canceled 01/18/2020 and rescheduled 02/12/2020 with [facility]  clinic PA/NP.  03/12/2020 with Dr.Taylor  Copy of ICM check sent to Dr. Caryl Comes and Dr Haroldine Laws for review  3 Month Trend    [place], RN 01/22/2020 2:58 PM

## 2020-01-22 NOTE — Telephone Encounter (Signed)
Remote ICM transmission received.  Attempted call to patient regarding ICM remote transmission and no answer.  

## 2020-01-23 ENCOUNTER — Other Ambulatory Visit: Payer: Self-pay | Admitting: *Deleted

## 2020-01-23 ENCOUNTER — Encounter: Payer: Self-pay | Admitting: *Deleted

## 2020-01-23 NOTE — Patient Outreach (Signed)
[facility]) Care Management  01/23/2020  LAYTON TAPPAN August 09, 1975 [id number]    CSW made a fourth and final attempt to try and contact patient today to perform the initial phone assessment, as well as assess and assist with social work needs and services; however, patient was unavailable at the time of CSW's call.  CSW has left HIPAA compliant messages on voicemail for patient, while awaiting a return call.  CSW will proceed with case closure, as required number of phone attempts were made and a Patient Unsuccessful Outreach Letter was mailed to patient's home, allowing more than 4 weeks for patient to respond if she was interested in receiving social work services and resources through [place] with [facility]) [facility].  CSW will notify patient's Primary Care Physician, Dr. Pricilla Holm of CSW's plans to close patient's case, as well as send a Physician Case Closure Letter.  CSW will also mail a Case Closure Letter to patient's home.  Last, CSW will route this note to Thea Silversmith, Chronic Special Needs Program Coordinator, and individual placing the referral, also with [facility] Care Management.    [name], BSW, MSW, LCSW  Licensed Education officer, [employer]  Mailing [place] [address] [place], [place] [address] Physical Address-[address] [place], [place] [address] Toll Free Main # [phone] Fax # [phone] Cell # [phone]  [name]

## 2020-01-24 MED ORDER — AMIODARONE HCL 200 MG PO TABS: 200.0000 mg | ORAL_TABLET | Freq: Two times a day (BID) | ORAL | 0 refills | Status: DC

## 2020-01-24 MED ORDER — AMIODARONE HCL 200 MG PO TABS: 200.0000 mg | ORAL_TABLET | Freq: Every day | ORAL | 3 refills | Status: DC

## 2020-01-24 NOTE — Addendum Note (Signed)
Addended by: Willeen Cass A on: 01/24/2020 10:58 AM   Modules accepted: Orders

## 2020-02-05 ENCOUNTER — Ambulatory Visit (INDEPENDENT_AMBULATORY_CARE_PROVIDER_SITE_OTHER): Payer: HMO

## 2020-02-05 DIAGNOSIS — Z95810 Presence of automatic (implantable) cardiac defibrillator: Secondary | ICD-10-CM

## 2020-02-05 DIAGNOSIS — I5042 Chronic combined systolic (congestive) and diastolic (congestive) heart failure: Secondary | ICD-10-CM

## 2020-02-07 NOTE — Progress Notes (Signed)
EPIC Encounter for ICM Monitoring  Patient Name: [name] is a 44 y.o. female Date: 02/07/2020 Primary Care Physican: Hoyt Koch, MD Primary Cardiologist:Bensimhon Electrophysiologist: Klein 8/4/2021OfficeWeight: 219lbs  Transmission reviewed.  9/12/2021HeartlogicHFIndexremains decreased to 18 from 36 suggesting a decrease in fluid accumulation.  Prescribed:  Torsemide20 mgTake1tablet (20 mg total) by mouthdaily.  Potassium10 mEq take4tablets(40 mEq total)twice a day.  Labs: 12/05/2019 Creatinine0.88, BUN9, Potassium3.6, Sodium137, GFR>60 11/28/2019 Creatinine0.90, BUN6, Potassium3.5, Sodium137, GFR>60  11/27/2019 Creatinine0.79, BUN9, Potassium3.4, Sodium137, GFR>60  11/23/2019 Creatinine0.84, BUN9, Potassium4.1, Sodium138, GFR>60 11/17/2019 Creatinine1.04, BUN15, Potassium4.9, Sodium134, GFR>60 11/16/2019 Creatinine1.20, BUN21, Potassium4.4, Sodium135, GFR55->60  11/15/2019 Creatinine1.81, BUN28, Potassium4.0, Sodium135, [id number]  A complete set of results can be found in Results Review.  Recommendations:No changes  Follow-up plan: ICM clinic phone appointment on10/18/2021(fluid level recheck will be at 9/20 [facility] OV). 91 day device clinic remote transmission10/26/2021.   EP/Cardiology next office visit: 02/12/2020 with [facility] clinic PA/NP.03/12/2020 with Dr.Taylor  Copy of ICM check sent to Dr. Caryl Comes.  3 Month Trend    8 Day Data Trend          [name], RN 02/07/2020 8:31 AM

## 2020-02-08 DIAGNOSIS — R062 Wheezing: Secondary | ICD-10-CM | POA: Diagnosis not present

## 2020-02-08 DIAGNOSIS — U071 COVID-19: Secondary | ICD-10-CM | POA: Diagnosis not present

## 2020-02-08 DIAGNOSIS — R05 Cough: Secondary | ICD-10-CM | POA: Diagnosis not present

## 2020-02-08 DIAGNOSIS — R0602 Shortness of breath: Secondary | ICD-10-CM | POA: Diagnosis not present

## 2020-02-12 ENCOUNTER — Encounter (HOSPITAL_COMMUNITY): Payer: HMO

## 2020-02-12 ENCOUNTER — Ambulatory Visit: Payer: HMO

## 2020-02-13 ENCOUNTER — Ambulatory Visit (INDEPENDENT_AMBULATORY_CARE_PROVIDER_SITE_OTHER): Payer: HMO

## 2020-02-13 DIAGNOSIS — I5042 Chronic combined systolic (congestive) and diastolic (congestive) heart failure: Secondary | ICD-10-CM

## 2020-02-13 DIAGNOSIS — Z95810 Presence of automatic (implantable) cardiac defibrillator: Secondary | ICD-10-CM

## 2020-02-13 NOTE — Progress Notes (Signed)
EPIC Encounter for ICM Monitoring  Patient Name: IRIANA [name] is a 44 y.o. female Date: 02/13/2020 Primary Care Physican: Hoyt Koch, MD Primary Cardiologist:Bensimhon Electrophysiologist: Caryl Comes 9/21/2021OfficeWeight: 219lbs  Spoke with patient and reports coughing, tiredness, and shortness of breath for the last several days  9/18/2021HeartlogicHFIndexremains increased from 18 to 31suggesting an increase in possible fluid accumulation.  Prescribed:  Torsemide20 mgTake1tablet (20 mg total) by mouthdaily.  Potassium10 mEq take4tablets(40 mEq total)twice a day.  Labs: 12/05/2019 Creatinine0.88, BUN9, Potassium3.6, Sodium137, GFR>60 11/28/2019 Creatinine0.90, BUN6, Potassium3.5, Sodium137, GFR>60  11/27/2019 Creatinine0.79, BUN9, Potassium3.4, Sodium137, GFR>60  11/23/2019 Creatinine0.84, BUN9, Potassium4.1, Sodium138, GFR>60 11/17/2019 Creatinine1.04, BUN15, Potassium4.9, Sodium134, GFR>60 11/16/2019 Creatinine1.20, BUN21, Potassium4.4, Sodium135, GFR55->60  11/15/2019 Creatinine1.81, BUN28, Potassium4.0, Sodium135, [id number]  A complete set of results can be found in Results Review.  Recommendations:Advised to take Torsemide 40 mg x 3 days and and additional 20 mEq of potassium x 3 days.    Follow-up plan: ICM clinic phone appointment on9/27/2021 to recheck fluid levels. 91 day device clinic remote transmission10/26/2021.   EP/Cardiology next office visit: 10/4/2021with [facility] clinic PA/NP.03/12/2020 with Dr.Taylor  Copy of ICM check sent to Dr. Caryl Comes and Dr Haroldine Laws for review.  3 month ICM trend: 02/10/2020          Megan Billings, RN 02/13/2020 2:38 PM

## 2020-02-15 ENCOUNTER — Encounter (HOSPITAL_COMMUNITY): Payer: Self-pay

## 2020-02-15 ENCOUNTER — Encounter (HOSPITAL_COMMUNITY): Payer: HMO

## 2020-02-15 ENCOUNTER — Other Ambulatory Visit: Payer: Self-pay

## 2020-02-15 ENCOUNTER — Emergency Department (HOSPITAL_COMMUNITY)
Admission: EM | Admit: 2020-02-15 | Discharge: 2020-02-15 | Disposition: A | Discharge status: Home / Self Care | Payer: HMO | Attending: Emergency Medicine | Admitting: Emergency Medicine

## 2020-02-15 DIAGNOSIS — Z79899 Other long term (current) drug therapy: Secondary | ICD-10-CM | POA: Insufficient documentation

## 2020-02-15 DIAGNOSIS — I5023 Acute on chronic systolic (congestive) heart failure: Secondary | ICD-10-CM | POA: Diagnosis not present

## 2020-02-15 DIAGNOSIS — Z95810 Presence of automatic (implantable) cardiac defibrillator: Secondary | ICD-10-CM

## 2020-02-15 DIAGNOSIS — E119 Type 2 diabetes mellitus without complications: Secondary | ICD-10-CM | POA: Diagnosis not present

## 2020-02-15 DIAGNOSIS — R55 Syncope and collapse: Secondary | ICD-10-CM | POA: Insufficient documentation

## 2020-02-15 DIAGNOSIS — J45901 Unspecified asthma with (acute) exacerbation: Secondary | ICD-10-CM | POA: Insufficient documentation

## 2020-02-15 DIAGNOSIS — R600 Localized edema: Secondary | ICD-10-CM | POA: Diagnosis not present

## 2020-02-15 DIAGNOSIS — I110 Hypertensive heart disease with heart failure: Secondary | ICD-10-CM | POA: Insufficient documentation

## 2020-02-15 DIAGNOSIS — Z8679 Personal history of other diseases of the circulatory system: Secondary | ICD-10-CM

## 2020-02-15 DIAGNOSIS — Z7951 Long term (current) use of inhaled steroids: Secondary | ICD-10-CM | POA: Insufficient documentation

## 2020-02-15 DIAGNOSIS — I959 Hypotension, unspecified: Secondary | ICD-10-CM | POA: Diagnosis not present

## 2020-02-15 LAB — CBC WITH DIFFERENTIAL/PLATELET
Abs Immature Granulocytes: 0.01 10*3/uL (ref 0.00–0.07)
Basophils Absolute: 0 10*3/uL (ref 0.0–0.1)
Basophils Relative: 0 %
Eosinophils Absolute: 0 10*3/uL (ref 0.0–0.5)
Eosinophils Relative: 0 %
HCT: 37.5 % (ref 36.0–46.0)
Hemoglobin: 11.2 g/dL — ABNORMAL LOW (ref 12.0–15.0)
Immature Granulocytes: 0 %
Lymphocytes Relative: 29 %
Lymphs Abs: 2.5 10*3/uL (ref 0.7–4.0)
MCH: 23.1 pg — ABNORMAL LOW (ref 26.0–34.0)
MCHC: 29.9 g/dL — ABNORMAL LOW (ref 30.0–36.0)
MCV: 77.5 fL — ABNORMAL LOW (ref 80.0–100.0)
Monocytes Absolute: 0.7 10*3/uL (ref 0.1–1.0)
Monocytes Relative: 8 %
Neutro Abs: 5.3 10*3/uL (ref 1.7–7.7)
Neutrophils Relative %: 63 %
Platelets: 274 10*3/uL (ref 150–400)
RBC: 4.84 MIL/uL (ref 3.87–5.11)
RDW: 16 % — ABNORMAL HIGH (ref 11.5–15.5)
WBC: 8.5 10*3/uL (ref 4.0–10.5)
nRBC: 0 % (ref 0.0–0.2)

## 2020-02-15 LAB — URINALYSIS, ROUTINE W REFLEX MICROSCOPIC
Bilirubin Urine: NEGATIVE
Glucose, UA: NEGATIVE mg/dL
Ketones, ur: 5 mg/dL — AB
Nitrite: NEGATIVE
Protein, ur: >=300 mg/dL — AB
Specific Gravity, Urine: 1.024 (ref 1.005–1.030)
pH: 5 (ref 5.0–8.0)

## 2020-02-15 LAB — CBG MONITORING, ED: Glucose-Capillary: 122 mg/dL — ABNORMAL HIGH (ref 70–99)

## 2020-02-15 LAB — BASIC METABOLIC PANEL
Anion gap: 15 (ref 5–15)
BUN: 15 mg/dL (ref 6–20)
CO2: 24 mmol/L (ref 22–32)
Calcium: 9.2 mg/dL (ref 8.9–10.3)
Chloride: 102 mmol/L (ref 98–111)
Creatinine, Ser: 1.25 mg/dL — ABNORMAL HIGH (ref 0.44–1.00)
GFR calc Af Amer: >60 mL/min (ref >60)
GFR calc non Af Amer: 52 mL/min — ABNORMAL LOW (ref >60)
Glucose, Bld: 152 mg/dL — ABNORMAL HIGH (ref 70–99)
Potassium: 3 mmol/L — ABNORMAL LOW (ref 3.5–5.1)
Sodium: 141 mmol/L (ref 135–145)

## 2020-02-15 LAB — I-STAT BETA HCG BLOOD, ED (MC, WL, AP ONLY): I-stat hCG, quantitative: <5 m[IU]/mL (ref <5)

## 2020-02-15 MED ORDER — CEPHALEXIN 500 MG PO CAPS
500.0000 mg | ORAL_CAPSULE | Freq: Two times a day (BID) | ORAL | 0 refills | Status: AC
Start: 2020-02-15 — End: 2020-02-22

## 2020-02-15 MED ORDER — SODIUM CHLORIDE 0.9 % IV BOLUS
500.0000 mL | Freq: Once | INTRAVENOUS | Status: AC
  Administered 2020-02-15: 500 mL via INTRAVENOUS

## 2020-02-15 MED ORDER — POTASSIUM CHLORIDE CRYS ER 20 MEQ PO TBCR
40.0000 meq | EXTENDED_RELEASE_TABLET | Freq: Once | ORAL | Status: AC
  Administered 2020-02-15: 40 meq via ORAL
  Filled 2020-02-15: qty 2

## 2020-02-15 NOTE — ED Notes (Signed)
UA sent to lab. Waiting for order.

## 2020-02-15 NOTE — ED Triage Notes (Addendum)
Pt arrived via [facility], s/p syncopal episode while donating blood, BP dropped   18G L AC  242ml NS bolus by EMS 4 mg zofran

## 2020-02-15 NOTE — ED Provider Notes (Signed)
[facility] DEPT Provider Note   CSN: [id number] Arrival date & time: 02/15/20  1745     History Syncope   [name] is a 44 y.o. female with history significant for heart failure, ICD in place, last EF less than 20, diabetes, hypertension who presents for evaluation of syncope.  Patient states she was called and told by cardiology a few days ago that her last transmission for her heart monitor showed a possible fluid increase.  Patient states they told her to double her torsemide as well as take potassium.  Patient states she has lost 8 pounds and has been urinating more frequently since she started taking the torsemide.  Patient states she has been feeling "fine."  Patient states she was donating blood today.  After she was donating blood she felt lightheaded and "like everything drained out to me."  Patient states the next thing she knew she had nursing standing over her.  No witness shaking, seizure-like activity however patient states she did urinate on herself.  Patient states she had no preceding sudden onset headache, paresthesias, chest pain, shortness of breath or abdominal pain.  Patient states she just feels "wiped out."  She denies any headache, lightheadedness, dizziness, chest pain, shortness of breath, abdominal pain, diarrhea, dysuria, paresthesias.  Denies aggravating or alleviating factors. No prior syncope.  History obtained from patient and past medical records.  No interpreter used.  HPI     Past Medical History:  Diagnosis Date  . Acute on chronic systolic (congestive) heart failure ([place]) 04/29/2017  . Acute pain of right shoulder 06/16/2017  . AICD (automatic cardioverter/defibrillator) present   . Anxiety   . Arthritis    right shoulder   . Asthma   . CHF (congestive heart failure) ([place])   . Chronic combined systolic and diastolic heart failure ([place]) 03/05/2014  . Closed low lateral malleolus fracture 10/23/2013  . Cystitis  10/21/2017  . Depression   . Depression with anxiety 01/20/2013  . Diabetes mellitus without complication ([place])   . Diverticulosis   . Dyspnea    comes and goes intermittently mostly with exertion   . Essential hypertension    Prev followed by [facility]/ Cardiology   . Fibroid    age 32  . Gallstones   . Generalized abdominal cramping   . History of cardiomyopathy   . Hypertension   . IBS (irritable bowel syndrome)   . 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 Insomnia 05/07/2017  . Labral tear of shoulder 04/04/2015    Injected 04/04/2015 Injected 12/03/2015   . Migraine    "monthly" (08/03/2016)  . Myofascial pain 06/16/2017  . NICM (nonischemic cardiomyopathy) ([place]) 08/03/2016  . Nonallopathic lesion of lumbosacral region 11/16/2016  . Nonallopathic lesion of sacral region 11/16/2016  . Nonallopathic lesion of thoracic region 08/20/2014  . Nonspecific chest pain 04/28/2017  . OSA (obstructive sleep apnea) 01/02/2013   NPSG 2009:  AHI 9/hr. CPAP intolerance >> "smothering" Good tolerance of auto device (optimal pressure 12-13 on download).  - referred to Dr Gwenette Greet    . OSA on CPAP   . Ovarian cyst    1999; surgically removed  . Patellofemoral syndrome of both knees 10/16/2016  . 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 PVC (premature ventricular contraction) 06/23/2016  . Seizures ([place])    "as a child" (08/03/2016)  . Termination of pregnancy    due to cardiac risk    Patient Active  Problem List   Diagnosis Date Noted  . SOB (shortness of breath) 11/27/2019  . V-tach ([place]) 11/12/2019  . Asthma with acute exacerbation 09/14/2019  . Headache 10/26/2018  . Chronic right-sided low back pain without sciatica 06/24/2018  . Trigger point of shoulder region, right 06/13/2018  . Patellar subluxation, left, initial encounter 04/25/2018  . Left flank pain 01/14/2018  . 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 Myofascial pain  06/16/2017  . Insomnia 05/07/2017  . History of cardiomyopathy   . Acute on chronic systolic (congestive) heart failure ([place]) 04/29/2017  . Nonspecific chest pain 04/28/2017  . 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 Patellofemoral syndrome of both knees 10/16/2016  . NICM (nonischemic cardiomyopathy) ([place]) 08/03/2016  . PVC (premature ventricular contraction) 06/23/2016  . Labral tear of shoulder 04/04/2015  . Chronic combined systolic and diastolic heart failure ([place]) 03/05/2014  . IBS (irritable bowel syndrome)   . Depression with anxiety 01/20/2013  . OSA (obstructive sleep apnea) 01/02/2013  . 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 Essential hypertension     Past Surgical History:  Procedure Laterality Date  . CARDIAC CATHETERIZATION N/A 11/11/2015   Procedure: Right/Left Heart Cath and Coronary Angiography;  Surgeon: Troy Sine, MD;  Location: [facility];  Service: Cardiovascular;  Laterality: N/A;  . CARDIAC CATHETERIZATION  ~ 2015  . CARDIAC DEFIBRILLATOR PLACEMENT  08/03/2016  . CESAREAN SECTION  1999  . COLONOSCOPY WITH PROPOFOL N/A 04/21/2016   Procedure: COLONOSCOPY WITH PROPOFOL;  Surgeon: Jerene Bears, MD;  Location: [facility];  Service: Gastroenterology;  Laterality: N/A;  . ESOPHAGOGASTRODUODENOSCOPY (EGD) WITH PROPOFOL N/A 04/21/2016   Procedure: ESOPHAGOGASTRODUODENOSCOPY (EGD) WITH PROPOFOL;  Surgeon: Jerene Bears, MD;  Location: [facility];  Service: Gastroenterology;  Laterality: N/A;  . FOOT FRACTURE SURGERY Right ~ 2003  . FRACTURE SURGERY    . ICD IMPLANT N/A 08/03/2016   Procedure: ICD Implant;  Surgeon: Deboraha Sprang, MD;  Location: [facility];  Service: Cardiovascular;  Laterality: N/A;  . LAPAROSCOPIC CHOLECYSTECTOMY  12/2006  . LAPAROSCOPIC GASTRIC SLEEVE RESECTION    . LAPAROSCOPY ABDOMEN DIAGNOSTIC  2008   "cut bile duct w/gallbladder OR; had to go in later & fix leak; hospitalized for 2  months"  . LEFT HEART CATHETERIZATION WITH CORONARY ANGIOGRAM N/A 02/26/2014   Procedure: LEFT HEART CATHETERIZATION WITH CORONARY ANGIOGRAM;  Surgeon: Jettie Booze, MD;  Location: [facility] C[facility] LAB;  Service: Cardiovascular;  Laterality: N/A;  . OVARIAN CYST REMOVAL Right 1999  . PVC ABLATION N/A 12/01/2017   Procedure: PVC ABLATION;  Surgeon: Evans Lance, MD;  Location: [facility];  Service: Cardiovascular;  Laterality: N/A;  . RIGHT HEART CATH N/A 08/05/2017   Procedure: RIGHT HEART CATH;  Surgeon: Jolaine Artist, MD;  Location: [facility];  Service: Cardiovascular;  Laterality: N/A;  . RIGHT HEART CATH N/A 11/16/2019   Procedure: RIGHT HEART CATH;  Surgeon: Jolaine Artist, MD;  Location: [facility];  Service: Cardiovascular;  Laterality: N/A;  . TUBAL LIGATION  1999     OB History    Gravida  2   Para  1   Term      Preterm      AB  1   Living  1     SAB      TAB  1   Ectopic      Multiple      Live Births  Family History  Problem Relation Age of Onset  . Emphysema Maternal Grandmother        smoked  . Heart disease Maternal Grandmother 45       MI  . Rheum arthritis Mother   . Allergies Daughter   . Colon cancer Neg Hx     Social History   Tobacco Use  . Smoking status: Never Smoker  . Smokeless tobacco: Never Used  Vaping Use  . Vaping Use: Never used  Substance Use Topics  . Alcohol use: No  . Drug use: No    Home Medications Prior to Admission medications   Medication Sig Start Date End Date Taking? Authorizing Provider  albuterol (PROAIR HFA) 108 (90 Base) MCG/ACT inhaler Inhale 1-2 puffs into the lungs every 6 (six) hours as needed for wheezing or shortness of breath. 08/17/19   Biagio Borg, MD  amiodarone (PACERONE) 200 MG tablet Take 1 tablet (200 mg total) by mouth daily. 01/24/20   Evans Lance, MD  amiodarone (PACERONE) 200 MG tablet Take 1 tablet (200 mg total) by mouth 2 (two) times  daily for 27 days. 01/24/20 02/20/20  Evans Lance, MD  benzonatate (TESSALON) 200 MG capsule Take 1 capsule (200 mg total) by mouth 3 (three) times daily as needed. 11/30/19   Hoyt Koch, MD  butalbital-acetaminophen-caffeine (FIORICET) [phone] MG tablet Take 1-2 tablets by mouth every 6 (six) hours as needed for headache.    [provider]  carvedilol (COREG) 12.5 MG tablet Take 1.5 tablets (18.75 mg total) by mouth 2 (two) times daily. 11/28/19 12/28/19  Bensimhon, Shaune Pascal, MD  cephALEXin (KEFLEX) 500 MG capsule Take 1 capsule (500 mg total) by mouth 2 (two) times daily for 7 days. 02/15/20 02/22/20  Sante Biedermann A, PA-C  colestipol (COLESTID) 1 g tablet Take 2 g by mouth daily as needed (IBS).    [provider]  diclofenac sodium (VOLTAREN) 1 % GEL Apply 4 g topically 4 (four) times daily. Patient taking differently: Apply 4 g topically 4 (four) times daily as needed (pain).  06/23/18   Hoyt Koch, MD  dicyclomine (BENTYL) 20 MG tablet Take 20 mg by mouth daily as needed for spasms.     [provider]  escitalopram (LEXAPRO) 10 MG tablet Take 1 tablet (10 mg total) by mouth daily. 11/30/19   Hoyt Koch, MD  fluticasone Asencion Islam) 50 MCG/ACT nasal spray Place 2 sprays into both nostrils daily as needed for allergies. 07/25/18   Hoyt Koch, MD  gabapentin (NEURONTIN) 100 MG capsule Take 100 mg by mouth 2 (two) times daily as needed (back spasms during the day).     [provider]  gabapentin (NEURONTIN) 300 MG capsule Take 300 mg by mouth at bedtime as needed (back spasms).    [provider]  Ibuprofen-Famotidine (DUEXIS) 800-26.6 MG TABS Take 1 tablet by mouth 2 (two) times daily as needed. 12/27/19   Hoyt Koch, MD  Ibuprofen-Famotidine (DUEXIS) 800-26.6 MG TABS Take 1 tablet by mouth 3 (three) times daily as needed. 01/17/20   Lyndal Pulley, DO  ipratropium-albuterol (DUONEB) 0.5-2.5 (3) MG/3ML SOLN  Take 3 mLs by nebulization every 6 (six) hours as needed. 11/17/19 12/17/19  Terrilee Croak, MD  ivabradine (CORLANOR) 7.5 MG TABS tablet Take 7.5 mg by mouth 2 (two) times daily with a meal.    [provider]  lidocaine (LIDODERM) 5 % Place 1 patch onto the skin daily. Remove & Discard  patch within 12 hours or as directed by MD Patient taking differently: Place 1 patch onto the skin 2 (two) times daily as needed (pain). Remove & Discard patch within 12 hours or as directed by MD 01/17/18   Hoyt Koch, MD  Magnesium Oxide 200 MG TABS Take 1 tablet (200 mg total) by mouth daily. 11/23/19   Clegg, Amy D, NP  montelukast (SINGULAIR) 10 MG tablet Take 1 tablet (10 mg total) by mouth at bedtime. 09/13/19   Fenton Foy, NP  Multiple Vitamins-Minerals (MULTIVITAMIN GUMMIES WOMENS PO) Take 2 tablets by mouth at bedtime.     [provider]  nitroGLYCERIN (NITROSTAT) 0.4 MG SL tablet Place 1 tablet (0.4 mg total) under the tongue every 5 (five) minutes as needed for chest pain. 09/05/19   Bensimhon, Shaune Pascal, MD  omeprazole (PRILOSEC) 20 MG capsule Take 20 mg by mouth daily as needed (for reflux symptoms).  09/09/17 12/05/19  [provider]  potassium chloride (MICRO-K) 10 MEQ CR capsule Take 4 capsules (40 mEq total) by mouth 2 (two) times daily. 12/05/19   Clegg, Amy D, NP  predniSONE (DELTASONE) 20 MG tablet Take 2 tablets (40 mg total) by mouth daily with breakfast. 11/30/19   Hoyt Koch, MD  sacubitril-valsartan (ENTRESTO) 97-103 MG Take 1 tablet by mouth 2 (two) times daily. 09/05/19   Bensimhon, Shaune Pascal, MD  SYMBICORT 160-4.5 MCG/ACT inhaler INHALE 2 PUFFS INTO THE LUNGS 2 (TWO) TIMES DAILY AS NEEDED. Patient taking differently: Inhale 2 puffs into the lungs 2 (two) times daily as needed (shortness of breath/wheezing).  05/03/18   Lyndal Pulley, DO  tiZANidine (ZANAFLEX) 4 MG tablet Take 4 mg by mouth every 8 (eight) hours as needed for muscle spasms.   12/02/18   [provider]  torsemide (DEMADEX) 20 MG tablet Take 1 tablet (20 mg total) by mouth daily. 11/18/19 12/27/19  Dahal, Marlowe Aschoff, MD  Ubrogepant (UBRELVY) 50 MG TABS Take 50 mg by mouth every 2 (two) hours as needed (migraine). Patient taking differently: Take 50 mg by mouth See admin instructions. Take one tablet (50 mg) by mouth at onset of migraine headache, may repeat in 30 minutes if still needed. 10/17/19   Hoyt Koch, MD  Vitamin D, Ergocalciferol, (DRISDOL) 1.25 MG (50000 UT) CAPS capsule Take 1 capsule (50,000 Units total) by mouth every 7 (seven) days. Patient taking differently: Take 50,000 Units by mouth every Wednesday.  08/01/18   Lyndal Pulley, DO    Allergies    Vancomycin, Aspirin, Contrast media [iodinated diagnostic agents], Avocado, Ciprofloxacin, Farxiga [dapagliflozin], and Sulfa antibiotics  Review of Systems   Review of Systems  Constitutional: Negative.   HENT: Negative.   Respiratory: Negative.   Cardiovascular: Negative.   Gastrointestinal: Negative.   Genitourinary: Negative.   Musculoskeletal: Negative.   Skin: Negative.   Neurological: Positive for syncope. Negative for dizziness, tremors, seizures, facial asymmetry, speech difficulty, weakness, light-headedness, numbness and headaches.  All other systems reviewed and are negative.   Physical Exam Updated Vital Signs BP 111/77   Pulse 89   Temp 97.8 F (36.6 C) (Oral)   Resp 19   SpO2 99%   Physical Exam Vitals and nursing note reviewed.  Constitutional:      General: She is not in acute distress.    Appearance: She is well-developed. She is obese. She is not ill-appearing, toxic-appearing or diaphoretic.  HENT:     Head: Normocephalic and atraumatic.     Nose:  Nose normal.     Mouth/Throat:     Mouth: Mucous membranes are moist.  Eyes:     Pupils: Pupils are equal, round, and reactive to light.  Cardiovascular:     Rate and Rhythm: Normal rate.     Pulses: Normal  pulses.     Heart sounds: Normal heart sounds.  Pulmonary:     Effort: Pulmonary effort is normal. No respiratory distress.     Breath sounds: Normal breath sounds.     Comments: Speaks in full sentences without difficulty. Lungs clear to auscultation bilaterally.  Abdominal:     General: Bowel sounds are normal. There is no distension.     Comments: Soft non tender without rebound or guarding. Normal active bowel sounds  Musculoskeletal:        General: No swelling, tenderness, deformity or signs of injury. Normal range of motion.     Cervical back: Normal range of motion.     Right lower leg: Edema present.     Left lower leg: Edema present.     Comments: Moves all 4 extremities without difficulty.  Compartments soft.  Bevelyn Buckles' sign negative.  Trace edema to mid shin  Skin:    General: Skin is warm and dry.     Capillary Refill: Capillary refill takes less than 2 seconds.  Neurological:     General: No focal deficit present.     Mental Status: She is alert and oriented to person, place, and time.     Comments: Mental Status:  Alert, oriented, thought content appropriate. Speech fluent without evidence of aphasia. Able to follow 2 step commands without difficulty.  Cranial Nerves:  II:  Peripheral visual fields grossly normal, pupils equal, round, reactive to light III,IV, VI: ptosis not present, extra-ocular motions intact bilaterally  V,VII: smile symmetric, facial light touch sensation equal VIII: hearing grossly normal bilaterally  IX,X: midline uvula rise  XI: bilateral shoulder shrug equal and strong XII: midline tongue extension  Motor:  5/5 in upper and lower extremities bilaterally including strong and equal grip strength and dorsiflexion/plantar flexion Sensory: Pinprick and light touch normal in all extremities.  Deep Tendon Reflexes: 2+ and symmetric  Cerebellar: normal finger-to-nose with bilateral upper extremities Gait: normal gait and balance CV: distal pulses  palpable throughout        ED Results / Procedures / Treatments   Labs (all labs ordered are listed, but only abnormal results are displayed) Labs Reviewed  BASIC METABOLIC PANEL - Abnormal; Notable for the following components:      Result Value   Potassium 3.0 (*)    Glucose, Bld 152 (*)    Creatinine, Ser 1.25 (*)    GFR calc non Af Amer 52 (*)    All other components within normal limits  CBC WITH DIFFERENTIAL/PLATELET - Abnormal; Notable for the following components:   Hemoglobin 11.2 (*)    MCV 77.5 (*)    MCH 23.1 (*)    MCHC 29.9 (*)    RDW 16.0 (*)    All other components within normal limits  URINALYSIS, ROUTINE W REFLEX MICROSCOPIC - Abnormal; Notable for the following components:   Color, Urine AMBER (*)    APPearance CLOUDY (*)    Hgb urine dipstick SMALL (*)    Ketones, ur 5 (*)    Protein, ur >=300 (*)    Leukocytes,Ua TRACE (*)    Bacteria, UA MANY (*)    All other components within normal limits  CBG MONITORING, ED -  Abnormal; Notable for the following components:   Glucose-Capillary 122 (*)    All other components within normal limits  URINE CULTURE  I-STAT BETA HCG BLOOD, ED ([facility], [facility], [facility] ONLY)    EKG None  Radiology No results found.  Procedures Procedures (including critical care time)  Medications Ordered in ED Medications  sodium chloride 0.9 % bolus 500 mL (0 mLs Intravenous Stopped 02/15/20 2111)  potassium chloride SA (KLOR-CON) CR tablet 40 mEq (40 mEq Oral Given 02/15/20 1955)    ED Course  I have reviewed the triage vital signs and the nursing notes.  Pertinent labs & imaging results that were available during my care of the patient were reviewed by me and considered in my medical decision making (see chart for details).  44 year old female presents for evaluation of syncope while donating blood.  She is afebrile, nonseptic, non-ill-appearing.  Was recently told by cardiology that needed to double her Lasix due to possible fluid  overload.  Patient has lost proximately 8 pounds over the last 2 days due to urinating and increasing her torsemide.  Patient states she is actually 4 pounds under her dry weight.  After donating blood today felt lightheaded and had a syncopal episode.  Witnessed by nursing.  No shaking or preceding headache, chest pain or shortness of breath.  Patient has nonfocal neuro exam without deficits.  Her heart and lungs are clear.  Dr. Ron Parker, attending to bedside.  Has performed bedside ultrasound.  IVC compressible.  Patient appears intravascularly dry.  Will give small fluid bolus.   Labs and imaging personally reviewed and interpreted:  CBC without leukocytosis, hemoglobin 71.0 Metabolic panel with creatinine 1.25 up from baseline, likely due to increased or strain, potassium 3.0, she is on home supplementation as well as given K-Dur here Preg negative UA with many bacteria, negative nitrite. Will culture. Does have urinary frequency however also could be due to increased in torsemide. Shared decision making will dc home with ABX and can deescalate when culture returns.  No tachycardia, tachypnea, hypoxia, leukocytosis, low suspicion for sepsis  [name] ICD interrogated. No episodes since Aug 23,2021. See picture in chart. No dysrhythmia on ICD.  Patient reassessed.  Orthostatic vital signs negative.  Ambulatory to restroom without difficulty.  Suspect patient's syncope due to recent blood donation, fluid depletion given she is below her dry weight.  Discussed close follow-up with cardiology tomorrow.  She will return here for any worsening symptoms.  Pt has remained hemodynamically stable throughout their time in the ED. Low suspicion for arrhythmia given reassuring interrogation of ICD maker.  The patient has been appropriately medically screened and/or stabilized in the ED. I have low suspicion for any other emergent medical condition which would require further screening, evaluation or treatment  in the ED or require inpatient management.  Patient is hemodynamically stable and in no acute distress.  Patient able to ambulate in department prior to ED.  Evaluation does not show acute pathology that would require ongoing or additional emergent interventions while in the emergency department or further inpatient treatment.  I have discussed the diagnosis with the patient and answered all questions.  Pain is been managed while in the emergency department and patient has no further complaints prior to discharge.  Patient is comfortable with plan discussed in room and is stable for discharge at this time.  I have discussed strict return precautions for returning to the emergency department.  Patient was encouraged to follow-up with PCP/specialist refer to at discharge.  Patient seen eval by attending, Dr. Ron Parker who agrees with above treatment, plan and disposition.    MDM Rules/Calculators/A&P                           Final Clinical Impression(s) / ED Diagnoses Final diagnoses:  Syncope, unspecified syncope type  History of heart failure  ICD (implantable cardioverter-defibrillator) in place    Rx / DC Orders ED Discharge Orders         Ordered    cephALEXin (KEFLEX) 500 MG capsule  2 times daily        02/15/20 2208           Mackie Holness A, PA-C 02/15/20 2212    Breck Coons, MD 02/15/20 [id number]

## 2020-02-15 NOTE — Discharge Instructions (Signed)
Continue taking your home medication  Follow-up with cardiology tomorrow  Return for new or worsening symptoms

## 2020-02-17 LAB — URINE CULTURE

## 2020-02-19 ENCOUNTER — Ambulatory Visit (INDEPENDENT_AMBULATORY_CARE_PROVIDER_SITE_OTHER): Payer: HMO

## 2020-02-19 DIAGNOSIS — I5042 Chronic combined systolic (congestive) and diastolic (congestive) heart failure: Secondary | ICD-10-CM | POA: Diagnosis not present

## 2020-02-19 DIAGNOSIS — Z95810 Presence of automatic (implantable) cardiac defibrillator: Secondary | ICD-10-CM

## 2020-02-21 NOTE — Progress Notes (Signed)
EPIC Encounter for ICM Monitoring  Patient Name: [name] is a 44 y.o. female Date: 02/21/2020 Primary Care Physican: Hoyt Koch, MD Primary Cardiologist:Bensimhon Electrophysiologist: Caryl Comes 9/21/2021OfficeWeight: 219lbs  Spoke with patient and reports coughing, tiredness, and shortness of breath have resolved.  She reports decreasing fluid intake since she was drinking > 64 oz daily.  9/18/2021HeartlogicHFIndexremainsdecreased from 31 to 14 suggesting fluid levels are close to baseline normal.  Prescribed:  Torsemide20 mgTake1tablet (20 mg total) by mouthdaily.  Potassium10 mEq take4tablets(40 mEq total)twice a day.  Labs: 12/05/2019 Creatinine0.88, BUN9,   Potassium3.6, Sodium137, GFR>60 11/28/2019 Creatinine0.90, BUN6,   Potassium3.5, Sodium137, GFR>60  11/27/2019 Creatinine0.79, BUN9,   Potassium3.4, Sodium137, GFR>60  11/23/2019 Creatinine0.84, BUN9,   Potassium4.1, Sodium138, GFR>60 11/17/2019 Creatinine1.04, BUN15, Potassium4.9, Sodium134, GFR>60 11/16/2019 Creatinine1.20, BUN21, Potassium4.4, Sodium135, GFR55->60  11/15/2019 Creatinine1.81, BUN28, Potassium4.0, Sodium135, ZOX09-60  A complete set of results can be found in Results Review.  Recommendations:No changes and encouraged to call if experiencing any fluid symptoms.    Follow-up plan: ICM clinic phone appointment on11/05/2019.91 day device clinic remote transmission10/26/2021.   EP/Cardiology next office visit: 10/4/2021with [facility] PA/NP.03/12/2020 with Dr.Taylor  Copy of ICM check sent to Dr. Caryl Comes.  3 Month Trend    8 Day Data Trend          [name], RN 02/21/2020 2:18 PM

## 2020-02-26 ENCOUNTER — Encounter (HOSPITAL_COMMUNITY): Payer: HMO

## 2020-02-26 ENCOUNTER — Encounter (HOSPITAL_COMMUNITY): Payer: Self-pay

## 2020-02-28 ENCOUNTER — Encounter: Payer: Self-pay | Admitting: Family Medicine

## 2020-02-28 ENCOUNTER — Ambulatory Visit: Payer: HMO | Admitting: Family Medicine

## 2020-02-29 ENCOUNTER — Telehealth: Payer: Self-pay

## 2020-02-29 NOTE — Telephone Encounter (Signed)
LVMT pt requesting call back about PREP referral and new class starting on 10/18.

## 2020-03-01 DIAGNOSIS — Z20828 Contact with and (suspected) exposure to other viral communicable diseases: Secondary | ICD-10-CM | POA: Diagnosis not present

## 2020-03-04 ENCOUNTER — Telehealth: Payer: Self-pay

## 2020-03-04 ENCOUNTER — Ambulatory Visit (INDEPENDENT_AMBULATORY_CARE_PROVIDER_SITE_OTHER): Payer: HMO

## 2020-03-04 DIAGNOSIS — I5042 Chronic combined systolic (congestive) and diastolic (congestive) heart failure: Secondary | ICD-10-CM

## 2020-03-04 DIAGNOSIS — Z95810 Presence of automatic (implantable) cardiac defibrillator: Secondary | ICD-10-CM

## 2020-03-04 NOTE — Progress Notes (Signed)
EPIC Encounter for ICM Monitoring  Patient Name: [name] is a 44 y.o. female Date: 03/04/2020 Primary Care Physican: Hoyt Koch, MD Primary Cardiologist:Bensimhon Electrophysiologist: Caryl Comes 9/21/2021OfficeWeight: 219lbs  Attempted call to patient and unable to reach.  Left detailed message per DPR regarding transmission. Transmission reviewed.   HeartlogicHFIndexalert on 03/04/2020 showing index has increased from 14 to 28 suggesting return of possible fluid accumulation since 02/19/2020.  Prescribed:  Torsemide20 mgTake1tablet (20 mg total) by mouthdaily.  Potassium10 mEq take4tablets(40 mEq total)twice a day.  Labs: 12/05/2019 Creatinine0.88, BUN9,   Potassium3.6, Sodium137, GFR>60 11/28/2019 Creatinine0.90, BUN6,   Potassium3.5, Sodium137, GFR>60  11/27/2019 Creatinine0.79, BUN9,   Potassium3.4, Sodium137, GFR>60  11/23/2019 Creatinine0.84, BUN9,   Potassium4.1, Sodium138, GFR>60 11/17/2019 Creatinine1.04, BUN15, Potassium4.9, Sodium134, GFR>60 11/16/2019 Creatinine1.20, BUN21, Potassium4.4, Sodium135, GFR55->60  11/15/2019 Creatinine1.81, BUN28, Potassium4.0, Sodium135, KTC28-83  A complete set of results can be found in Results Review.  Recommendations:Left voice mail with ICM number and encouraged to call if experiencing any fluid symptoms.  Pt self adjusts Torsemide when needed and will advise to do so if patient returns call.  Follow-up plan: ICM clinic phone appointment on10/18/2021 to recheck fluid levels.91 day device clinic remote transmission10/26/2021.   EP/Cardiology next office visit: 03/12/2020 with Dr.Taylor.  Patient has canceled 3 prior appointments with HF clinic in August/Sept/Oct.  Copy of ICM check sent to Dr. Caryl Comes and Dr Haroldine Laws.  3 Month Trend    8 Day Data Trend           Megan Billings, RN 03/04/2020 10:39 AM

## 2020-03-04 NOTE — Progress Notes (Signed)
Attempted return call to patient as requested by voice mail message.  Left detailed message regarding report.

## 2020-03-04 NOTE — Telephone Encounter (Signed)
Remote ICM transmission received.  Attempted call to patient regarding Heartlogic Index device alert for increasing possible fluid accumulation.  Left message to return call.

## 2020-03-09 DIAGNOSIS — U071 COVID-19: Secondary | ICD-10-CM | POA: Diagnosis not present

## 2020-03-09 DIAGNOSIS — R062 Wheezing: Secondary | ICD-10-CM | POA: Diagnosis not present

## 2020-03-09 DIAGNOSIS — R059 Cough, unspecified: Secondary | ICD-10-CM | POA: Diagnosis not present

## 2020-03-09 DIAGNOSIS — R0602 Shortness of breath: Secondary | ICD-10-CM | POA: Diagnosis not present

## 2020-03-11 ENCOUNTER — Ambulatory Visit (INDEPENDENT_AMBULATORY_CARE_PROVIDER_SITE_OTHER): Payer: HMO

## 2020-03-11 ENCOUNTER — Telehealth: Payer: Self-pay | Admitting: Internal Medicine

## 2020-03-11 DIAGNOSIS — I5042 Chronic combined systolic (congestive) and diastolic (congestive) heart failure: Secondary | ICD-10-CM

## 2020-03-11 DIAGNOSIS — Z95810 Presence of automatic (implantable) cardiac defibrillator: Secondary | ICD-10-CM

## 2020-03-11 NOTE — Telephone Encounter (Signed)
Patient is due for a yearly physical before May 25, 2020.  ° °LVM for patient to call back and set up an appointment.  °

## 2020-03-12 ENCOUNTER — Encounter: Payer: HMO | Admitting: Internal Medicine

## 2020-03-13 NOTE — Progress Notes (Signed)
EPIC Encounter for ICM Monitoring  Patient Name: [name] is a 44 y.o. female Date: 03/13/2020 Primary Care Physican: Hoyt Koch, MD Primary Cardiologist:Bensimhon Electrophysiologist: Caryl Comes 9/21/2021OfficeWeight: 219lbs  Transmission reviewed and results sent via mychart message.   HeartlogicHFIndexalert on 03/04/2020 showing index decreased from 28 to 18 suggesting fluid levels are trending back to baseline. HeartLogicT Heart Failure Index remains above the alert recovery threshold of 8.  Prescribed:  Torsemide20 mgTake1tablet (20 mg total) by mouthdaily.  Potassium10 mEq take4tablets(40 mEq total)twice a day.  Labs: 12/05/2019 Creatinine0.88, BUN9, Potassium3.6, Sodium137, GFR>60 11/28/2019 Creatinine0.90, BUN6, Potassium3.5, Sodium137, GFR>60  11/27/2019 Creatinine0.79, BUN9, Potassium3.4, Sodium137, GFR>60  11/23/2019 Creatinine0.84, BUN9, Potassium4.1, Sodium138, GFR>60 11/17/2019 Creatinine1.04, BUN15, Potassium4.9, Sodium134, GFR>60 11/16/2019 Creatinine1.20, BUN21, Potassium4.4, Sodium135, GFR55->60  11/15/2019 Creatinine1.81, BUN28, Potassium4.0, Sodium135, [id number]  A complete set of results can be found in Results Review.  Recommendations:No changes  Follow-up plan: ICM clinic phone appointment on11/05/2019.91 day device clinic remote transmission10/26/2021.   EP/Cardiology next office visit: 04/09/2020 with Dr.Taylor.  Patient has canceled 3 previous appointments with HF clinic in August/Sept/Oct.  Copy of ICM check sent to Dr. Caryl Comes.  3 Month Trend    8 Day Data Trend          [name], RN 03/13/2020 2:20 PM

## 2020-03-21 ENCOUNTER — Ambulatory Visit: Payer: HMO | Admitting: Internal Medicine

## 2020-03-25 ENCOUNTER — Ambulatory Visit (INDEPENDENT_AMBULATORY_CARE_PROVIDER_SITE_OTHER): Payer: HMO

## 2020-03-25 DIAGNOSIS — Z95810 Presence of automatic (implantable) cardiac defibrillator: Secondary | ICD-10-CM

## 2020-03-25 DIAGNOSIS — I5042 Chronic combined systolic (congestive) and diastolic (congestive) heart failure: Secondary | ICD-10-CM | POA: Diagnosis not present

## 2020-03-27 ENCOUNTER — Other Ambulatory Visit: Payer: Self-pay

## 2020-03-27 NOTE — Patient Outreach (Signed)
  [facility]) Care Management Chronic Special Needs Program    03/27/2020  Name: [name], DOB: 1975-12-26  MRN: [id number]   [name] is enrolled in a chronic special needs plan for Heart Failure. RNCM called to follow up and review individualized care plan. No answer. HIPAA compliant message left.   Plan: Chronic care management coordinator will attempt outreach within 1-2 weeks.   Thea Silversmith, RN, MSN, [place] [employer] [phone]

## 2020-03-29 NOTE — Progress Notes (Signed)
EPIC Encounter for ICM Monitoring  Patient Name: [name] is a 44 y.o. female Date: 03/29/2020 Primary Care Physican: Hoyt Koch, MD Primary Cardiologist:Bensimhon Electrophysiologist: Klein 9/21/2021OfficeWeight: 219lbs  Transmission reviewed.  HeartlogicHFIndex6 suggesting fluid levels are normal.  Prescribed:  Torsemide20 mgTake1tablet (20 mg total) by mouthdaily.  Potassium10 mEq take4tablets(40 mEq total)twice a day.  Labs: 12/05/2019 Creatinine0.88, BUN9, Potassium3.6, Sodium137, GFR>60 11/28/2019 Creatinine0.90, BUN6, Potassium3.5, Sodium137, GFR>60  11/27/2019 Creatinine0.79, BUN9, Potassium3.4, Sodium137, GFR>60  11/23/2019 Creatinine0.84, BUN9, Potassium4.1, Sodium138, GFR>60 11/17/2019 Creatinine1.04, BUN15, Potassium4.9, Sodium134, GFR>60 11/16/2019 Creatinine1.20, BUN21, Potassium4.4, Sodium135, GFR55->60  11/15/2019 Creatinine1.81, BUN28, Potassium4.0, Sodium135, XYD28-97  A complete set of results can be found in Results Review.  Recommendations:No changes  Follow-up plan: ICM clinic phone appointment on 04/30/2020.91 day device clinic remote transmission1/25/2022.Marland Kitchen   EP/Cardiology next office visit: 04/09/2020 with Dr.Taylor. Patient has canceled 3 previous appointments with HF clinic in August/Sept/Oct.  Copy of ICM check sent to Dr. Caryl Comes.     [name], RN 03/29/2020 4:45 PM

## 2020-04-01 ENCOUNTER — Other Ambulatory Visit: Payer: Self-pay

## 2020-04-01 NOTE — Patient Outreach (Signed)
°  [facility]) Care Management Chronic Special Needs Program  04/01/2020  Name: [name] DOB: Jul 26, 1975  MRN: [id number]  [name] is enrolled in a chronic special needs plan for Heart Failure. RNCM called to follow up, review and update care plan.  Subjective: Client reports "everything has been good". Client reports she continues to be followed by advanced heart failure. Client denies any signs/symptoms of heart failure exacerbation.   Client request RNCM call back later today around 4pm to complete assessment.   Plan: RNCM called, but no answer. HIPPA compliant message left. RNCM will attempt outreach tomorrow.   Thea Silversmith, RN, MSN, [place] [phone]

## 2020-04-02 ENCOUNTER — Other Ambulatory Visit: Payer: Self-pay

## 2020-04-02 NOTE — Patient Outreach (Signed)
  [facility]) Care Management Chronic Special Needs Program  04/02/2020  Name: CALEA HRIBAR DOB: 08-20-1975  MRN: [id number]  Ms. Jayne Peckenpaugh is enrolled in a chronic special needs plan for Heart Failure. RNCM called to follow up. RNCM spoke with client on 04/01/20, but client had to get off the phone and requested call back later in the day. RNCM called back at requested time, however no answer. RNCM followed up with call today. No answer. HIPPA compliant message left. 3rd outreach attempt. RNCM updated care plan based on assessment 04/01/20 and available data.   Per telephonic assessment on 04/01/20, "Client reports "everything has been good". Client reports she continues to be followed by advanced heart failure. Client denies any signs/symptoms of heart failure exacerbation." PHQ2 score 0/ PHQ9 score 0.  Goals Addressed              This Visit's Progress   .  Client will verbalize knowledge of Heart Failure disease self management skills within the next 6 months. (pt-stated)   On track     Client reports usually weights self daily.  Reviewed signs/symptoms of exacerbation.  Continue heart failure self-management actions: Continue to take medications as prescribed. Continue to attend provider visits as scheduled. Know signs and symptoms of congestive heart failure exacerbation. Follow your recommended fluid intake limit. Eat healthy and monitor salt intake-per provider limit salt to 2000 mg per day Weight daily and record. Take record to provider visits as scheduled. Call your doctor for signs/symptoms of worsening condition; for questions; concerns and as needed.     .  Client will verbalize knowledge of self management of Hypertension as evidences by BP reading of 140/90 or less; or as defined by provider   On track     Client reports blood pressure has been "good". Blood pressure on 12/27/19 112/84 and blood pressure on 09/05/19 118/82  Continue to follow up with  your doctor as scheduled. Take your medications as prescribed by your doctor. Know your target blood pressure range. Monitor your blood pressure and take results to your doctor's appointment.  Monitor the amount of salt you are eating. Continue to exercise as tolerated and remain active. Eat heart healthy diet (full of fruits, vegetables, whole grains, lean protein, limit salt and unhealthy fats.     .  Obtain annual  Lipid Profile, LDL-C   On track     Client unsure. Not noted in available records.  Continue to attend provider visits as scheduled. It is important to follow up with your provider for recommended test.  Try to avoid saturated fats, trans-fats, and eat more fiber.     .  Patient Stated: "feel better"   On track     Client reports, "everything has been good". Continue self management actions: Take medications as recommended. Call your RN care coordinator if you have any questions. Call your doctors as needed.  Attend follow up visits as scheduled.       Plan: Send updated care plan to client; send updated care plan to primary care provider. [employer] Care Management Team will outreach to client per tier level within the next 3 months. [facility] Management will continue to provide services for this member through 05/24/2020. The [employer] Care Management Team will assume care 05/25/2020.  Thea Silversmith, RN, MSN, [place] [phone]

## 2020-04-08 ENCOUNTER — Other Ambulatory Visit: Payer: Self-pay

## 2020-04-08 NOTE — Patient Outreach (Signed)
  [facility]) Care Management Chronic Special Needs Program    04/08/2020  Name: ANNACLAIRE WALSWORTH, DOB: [birth year]  MRN: [id number]   Ms. Tachina Spoonemore is enrolled in a chronic special needs plan for Heart Failure. RNCM returned call to client who was following up based on outreach attempts last week. No answer. HIPPA compliant message left. Three unsuccessful outreach attempts and care plan updated last week.  Plan: RNCM will await return call. Next outreach as previously scheduled per [place] management team. [place] Management will continue to provided services through 05/24/2020. The [employer] care management team will assume care 05/25/2020.  Thea Silversmith, RN, MSN, [place] [phone]

## 2020-04-09 ENCOUNTER — Ambulatory Visit (INDEPENDENT_AMBULATORY_CARE_PROVIDER_SITE_OTHER): Payer: HMO | Admitting: Internal Medicine

## 2020-04-09 ENCOUNTER — Other Ambulatory Visit: Payer: Self-pay

## 2020-04-09 ENCOUNTER — Encounter: Payer: Self-pay | Admitting: Internal Medicine

## 2020-04-09 VITALS — BP 142/86 | HR 98 | Ht 65.0 in | Wt 223.6 lb

## 2020-04-09 DIAGNOSIS — Z8679 Personal history of other diseases of the circulatory system: Secondary | ICD-10-CM

## 2020-04-09 DIAGNOSIS — I5042 Chronic combined systolic (congestive) and diastolic (congestive) heart failure: Secondary | ICD-10-CM | POA: Diagnosis not present

## 2020-04-09 DIAGNOSIS — I493 Ventricular premature depolarization: Secondary | ICD-10-CM

## 2020-04-09 DIAGNOSIS — Z95810 Presence of automatic (implantable) cardiac defibrillator: Secondary | ICD-10-CM | POA: Diagnosis not present

## 2020-04-09 NOTE — Progress Notes (Signed)
HPI Mrs. Stief returns today for followup. She is a pleasant 44 yo woman with a h/o chronic systolic heart failure, HTN, obesity and lung disease. She has done well in the interim. Her CHF symptoms are class 2. She has not been vaccinated from covid. She denies syncope or ICD shocks. No chest pain.  Allergies  Allergen Reactions   Vancomycin Other (See Comments)    "did something to my kidneys," PROGRESSED TO KIDNEY FAILURE!!   Aspirin Other (See Comments)    Wheezing, (Pt states that she just wheezes some when she takes aspirin by itself but she can take aspirin in a combination product).   Contrast Media [Iodinated Diagnostic Agents] Other (See Comments)    Multiple CT contrast studies done over 2 weeks caused ARF   Avocado Itching and Swelling   Ciprofloxacin Itching and Rash   Farxiga [Dapagliflozin] Rash   Sulfa Antibiotics Itching and Rash     Current Outpatient Medications  Medication Sig Dispense Refill   albuterol (PROAIR HFA) 108 (90 Base) MCG/ACT inhaler Inhale 1-2 puffs into the lungs every 6 (six) hours as needed for wheezing or shortness of breath. 18 g 5   amiodarone (PACERONE) 200 MG tablet Take 1 tablet (200 mg total) by mouth daily. 90 tablet 3   butalbital-acetaminophen-caffeine (FIORICET) 50-325-40 MG tablet Take 1-2 tablets by mouth every 6 (six) hours as needed for headache.     colestipol (COLESTID) 1 g tablet Take 2 g by mouth daily as needed (IBS).     diclofenac sodium (VOLTAREN) 1 % GEL Apply 4 g topically 4 (four) times daily. 300 g 6   dicyclomine (BENTYL) 20 MG tablet Take 20 mg by mouth daily as needed for spasms.      escitalopram (LEXAPRO) 10 MG tablet Take 1 tablet (10 mg total) by mouth daily. 90 tablet 3   fluticasone (FLONASE) 50 MCG/ACT nasal spray Place 2 sprays into both nostrils daily as needed for allergies. 48 g 0   gabapentin (NEURONTIN) 100 MG capsule Take 100 mg by mouth 2 (two) times daily as needed (back spasms  during the day).      gabapentin (NEURONTIN) 300 MG capsule Take 300 mg by mouth at bedtime as needed (back spasms).     Ibuprofen-Famotidine (DUEXIS) 800-26.6 MG TABS Take 1 tablet by mouth 2 (two) times daily as needed. 30 tablet 0   ivabradine (CORLANOR) 7.5 MG TABS tablet Take 7.5 mg by mouth 2 (two) times daily with a meal.     lidocaine (LIDODERM) 5 % Place 1 patch onto the skin daily. Remove & Discard patch within 12 hours or as directed by MD 30 patch 6   Magnesium Oxide 200 MG TABS Take 1 tablet (200 mg total) by mouth daily. 90 tablet 3   montelukast (SINGULAIR) 10 MG tablet Take 1 tablet (10 mg total) by mouth at bedtime. 30 tablet 3   Multiple Vitamins-Minerals (MULTIVITAMIN GUMMIES WOMENS PO) Take 2 tablets by mouth at bedtime.      nitroGLYCERIN (NITROSTAT) 0.4 MG SL tablet Place 1 tablet (0.4 mg total) under the tongue every 5 (five) minutes as needed for chest pain. 75 tablet 0   sacubitril-valsartan (ENTRESTO) 97-103 MG Take 1 tablet by mouth 2 (two) times daily. 60 tablet 6   SYMBICORT 160-4.5 MCG/ACT inhaler INHALE 2 PUFFS INTO THE LUNGS 2 (TWO) TIMES DAILY AS NEEDED. 30.6 Inhaler 3   tiZANidine (ZANAFLEX) 4 MG tablet Take 4 mg by mouth every 8 (  eight) hours as needed for muscle spasms.      Ubrogepant (UBRELVY) 50 MG TABS Take 50 mg by mouth every 2 (two) hours as needed (migraine). 30 tablet 2   Vitamin D, Ergocalciferol, (DRISDOL) 1.25 MG (50000 UT) CAPS capsule Take 1 capsule (50,000 Units total) by mouth every 7 (seven) days. 12 capsule 0   carvedilol (COREG) 12.5 MG tablet Take 1.5 tablets (18.75 mg total) by mouth 2 (two) times daily. 90 tablet 6   ipratropium-albuterol (DUONEB) 0.5-2.5 (3) MG/3ML SOLN Take 3 mLs by nebulization every 6 (six) hours as needed. 360 mL 0   omeprazole (PRILOSEC) 20 MG capsule Take 20 mg by mouth daily as needed (for reflux symptoms).      torsemide (DEMADEX) 20 MG tablet Take 1 tablet (20 mg total) by mouth daily. 30 tablet 0     No current facility-administered medications for this visit.     Past Medical History:  Diagnosis Date   Acute on chronic systolic (congestive) heart failure (HCC) 04/29/2017   Acute pain of right shoulder 06/16/2017   AICD (automatic cardioverter/defibrillator) present    Anxiety    Arthritis    right shoulder    Asthma    CHF (congestive heart failure) (HCC)    Chronic combined systolic and diastolic heart failure ([place]) 03/05/2014   Closed low lateral malleolus fracture 10/23/2013   Cystitis 10/21/2017   Depression    Depression with anxiety 01/20/2013   Diabetes mellitus without complication (HCC)    Diverticulosis    Dyspnea    comes and goes intermittently mostly with exertion    Essential hypertension    Prev followed by [name]    Fibroid    age 44   Gallstones    Generalized abdominal cramping    History of cardiomyopathy    Hypertension    IBS (irritable bowel syndrome)    ICD (implantable cardioverter-defibrillator), single, 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 CPAP intolerance >> "smothering" Good tolerance of auto device (optimal pressure 12-13 on download).  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   Past Surgical History:  Procedure Laterality Date   CARDIAC CATHETERIZATION N/A 11/11/2015   Procedure: Right/Left Heart Cath and Coronary Angiography;  Surgeon: Troy Sine, MD;  Location: [facility];  Service: Cardiovascular;  Laterality: N/A;   CARDIAC CATHETERIZATION  ~ 2015   CARDIAC DEFIBRILLATOR PLACEMENT  08/03/2016   CESAREAN SECTION  1999   COLONOSCOPY WITH PROPOFOL N/A 04/21/2016   Procedure: COLONOSCOPY WITH PROPOFOL;  Surgeon: Jerene Bears,  MD;  Location: [facility];  Service: Gastroenterology;  Laterality: N/A;   ESOPHAGOGASTRODUODENOSCOPY (EGD) WITH PROPOFOL N/A 04/21/2016   Procedure: ESOPHAGOGASTRODUODENOSCOPY (EGD) WITH PROPOFOL;  Surgeon: Jerene Bears, MD;  Location: [facility];  Service: Gastroenterology;  Laterality: N/A;   FOOT FRACTURE SURGERY Right ~ 2003   FRACTURE SURGERY     ICD IMPLANT N/A 08/03/2016   Procedure: ICD Implant;  Surgeon: Deboraha Sprang, MD;  Location: [place] [facility];  Service: Cardiovascular;  Laterality: N/A;   LAPAROSCOPIC CHOLECYSTECTOMY  12/2006   LAPAROSCOPIC GASTRIC SLEEVE RESECTION     LAPAROSCOPY ABDOMEN DIAGNOSTIC  2008   "cut bile duct w/gallbladder OR; had to go in later & fix leak; hospitalized for 2 months"   LEFT HEART CATHETERIZATION WITH CORONARY ANGIOGRAM N/A 02/26/2014   Procedure: LEFT HEART CATHETERIZATION WITH CORONARY ANGIOGRAM;  Surgeon: Jettie Booze, MD;  Location: [facility] C[facility] LAB;  Service: Cardiovascular;  Laterality: N/A;   OVARIAN CYST REMOVAL Right 1999   PVC ABLATION N/A 12/01/2017   Procedure: PVC ABLATION;  Surgeon: Evans Lance, MD;  Location: [place] [facility];  Service: Cardiovascular;  Laterality: N/A;   RIGHT HEART CATH N/A 08/05/2017   Procedure: RIGHT HEART CATH;  Surgeon: Jolaine Artist, MD;  Location: [place] [facility];  Service:  Cardiovascular;  Laterality: N/A;   RIGHT HEART CATH N/A 11/16/2019   Procedure: RIGHT HEART CATH;  Surgeon: Jolaine Artist, MD;  Location: [place] [facility];  Service: Cardiovascular;  Laterality: N/A;   TUBAL LIGATION  1999     Family History  Problem Relation Age of Onset   Emphysema Maternal Grandmother        smoked   Heart disease Maternal Grandmother 34       MI   Rheum arthritis Mother    Allergies Daughter    Colon cancer Neg Hx      Social History   Socioeconomic History   Marital status: Married    Spouse name: Not on file   Number of children: 1   Years of education: Not on file   Highest education level: Not on file  Occupational History   Occupation: stay at home mom  Tobacco Use   Smoking status: Never Smoker   Smokeless tobacco: Never Used  Vaping Use   Vaping Use: Never used  Substance and Sexual Activity   Alcohol use: No   Drug use: No   Sexual activity: Yes  Other Topics Concern   Not on file  Social History Narrative   Not on file   Social Determinants of Health   Financial Resource Strain:    Difficulty of Paying Living Expenses: Not on file  Food Insecurity: No Food Insecurity   Worried About Running Out of Food in the Last Year: Never true   [place] in the Last Year: Never true  Transportation Needs: No Transportation Needs   Lack of Transportation (Medical): No   Lack of Transportation (Non-Medical): No  Physical Activity: Insufficiently Active   Days of Exercise per Week: 2 days   Minutes of Exercise per Session: 40 min  Stress:    Feeling of Stress : Not on file  Social Connections: Socially Isolated   Frequency of Communication with Friends and Family: Twice a week   Frequency of Social Gatherings with Friends and Family: Never   Attends Religious Services: Never   Marine scientist or Organizations: No   Attends Archivist Meetings: Never  Marital Status: Married   Human resources officer Violence:    Fear of Current or Ex-Partner: Not on file   Emotionally Abused: Not on file   Physically Abused: Not on file   Sexually Abused: Not on file     BP (!) 142/86    Pulse 98    Ht 5\' 5"  (1.651 m)    Wt 223 lb 9.6 oz (101.4 kg)    SpO2 99%    BMI 37.21 kg/m   Physical Exam:  Well appearing overweight woman, NAD HEENT: Unremarkable Neck:  6 cm JVD, no thyromegally Lymphatics:  No adenopathy Back:  No CVA tenderness Lungs:  Clear with no wheezes HEART:  Regular rate rhythm, no murmurs, no rubs, no clicks Abd:  soft, positive bowel sounds, no organomegally, no rebound, no guarding Ext:  2 plus pulses, no edema, no cyanosis, no clubbing Skin:  No rashes no nodules Neuro:  CN II through XII intact, motor grossly intact  EKG - nsr with PVCs  DEVICE  Normal device function.  See PaceArt for details.   Assess/Plan: 1. PVCs - she is asymptomatic at this time. She will continue amiodarone. 2. Chronic systolic heart failure - her symptoms remain class 2.  3. ICD - her [employer] ICD is working normally.  4. Obesity - I discussed the importance of weight loss and we discussed intermittnet fasting.  Carleene Overlie Alvon Nygaard,MD

## 2020-04-09 NOTE — Patient Instructions (Signed)
Medication Instructions:  Your physician recommends that you continue on your current medications as directed. Please refer to the Current Medication list given to you today.  *If you need a refill on your cardiac medications before your next appointment, please call your pharmacy*   Lab Work: None ordered.  If you have labs (blood work) drawn today and your tests are completely normal, you will receive your results only by: MyChart Message (if you have MyChart) OR A paper copy in the mail If you have any lab test that is abnormal or we need to change your treatment, we will call you to review the results.   Testing/Procedures: None ordered.    Follow-Up: At [facility], you and your health needs are our priority.  As part of our continuing mission to provide you with exceptional heart care, we have created designated Provider Care Teams.  These Care Teams include your primary Cardiologist (physician) and Advanced Practice Providers (APPs -  Physician Assistants and Nurse Practitioners) who all work together to provide you with the care you need, when you need it.  We recommend signing up for the patient portal called "MyChart".  Sign up information is provided on this After Visit Summary.  MyChart is used to connect with patients for Virtual Visits (Telemedicine).  Patients are able to view lab/test results, encounter notes, upcoming appointments, etc.  Non-urgent messages can be sent to your provider as well.   To learn more about what you can do with MyChart, go to [email or web address]://www.mychart.com.    Your next appointment:   12 month(s)  The format for your next appointment:   In Person  Provider:   Gregg Taylor, MD    

## 2020-04-11 NOTE — Addendum Note (Signed)
Addended by: Rose Phi on: 04/11/2020 08:33 AM   Modules accepted: Orders

## 2020-04-19 ENCOUNTER — Ambulatory Visit: Payer: HMO | Admitting: Internal Medicine

## 2020-04-19 NOTE — Progress Notes (Deleted)
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   HPI 12/28/2012 1st pulmonary eval/ [name] cc indolent onset progressive doe x 15 years gradually worse to point where has to stop at top one flight of steps or length of parking lot, assoc with noct dry cough esp if too flat rec No change rx W/u for PE > neg  Admit date: 01/01/2013  Discharge date: 01/04/2013  Time spent: 30 minutes  Recommendations for Outpatient Follow-up:  1. Follow up with PCP in 1-2 weeks 2. Repeat basic metabolic profile in 1 week 3. Consider resuming Cozaar if blood pressure allows. This was held secondary to a soft blood pressure during hospitalization 4. Consider outpatient Cardiology follow up 5. Consider outpatient Cardiac Rehab Discharge Diagnoses:  Principal Problem:  Dyspnea  Active Problems:  HBP (high blood pressure)  Leg swelling  OSA on CPAP  Postpartum cardiomyopathy  Chest pain  Discharge Condition: Stable  Diet recommendation: Heart Healthy  Filed Weights    01/02/13 0431  01/03/13 0604   Weight:  113.49 kg (250 lb 3.2 oz)  112.22 kg (247 lb 6.4 oz)   History of present illness:  44 yo female h/o postpartum cardiomyopathy for the last 15 years G1P1, osa cpap dependent, comes in with worsening sob with exertion and occasional sscp for the last week. She has seen her cardiologist and pulmonologist as outpt. She has had a vq scan showing low prob, along with u/s le neg for dvt. She is scheduled for pfts and repeat sleep study. She has not been having any fevers. N/v. No diarrhea. Some occasional le swelling and hand swelling, only on hctz never been on any stronger diuretics. No cough. Cp free now. Last known EF unknown no echo done in 2 years.   Hospital Course:  The patient was admitted to the floor. A a cxr was found to be unremarkable. On further questioning, the patient describes symptoms suggestive of constipation. She was started on a bowel regimen with good results. However, the patient continued to complain of shortness of breath, worse with exertion. Pulmonary was consulted. The patient was recommended to continue with PFT's ordered, proceed with a pending sleep study, and cont CPAP with an auto-titration device which is ordered for home with data to go to Dr. Melvyn Novas. The patient is to follow up with Pulmonary as an outpatient.  Of note, the patient was noted to have and EF of 35-40% on 2D echo. BNP was normal. The patient will be discharged home with lasix instead of hctz. She should resume Cozaar once her blood pressure allows it.This was held for a soft blood pressure during hospitalization.       01/13/2013  Post hosp f/u ov/[name]  Chief Complaint  Patient presents with  . Shortness of Breath    Breathing is slightly improved. Reports still having chest pressure.   14 steps  x 6 years sob  but worse last  6 months rec No change rx, work on wt loss   02/08/2013 f/u ov/[name] re: sob  Chief Complaint  Patient presents with  . Followup with PFT    Pt states her breathing is the same. Chest pressure some better, but comes and goes.  rec Ideally you should walk 30 min daily at level where you are short of breath but never out of breath except at the very end of exercise  Call me to schedule a CPST after at least 2 weeks of doing this daily if not satisfied that you are improving.    04/19/2020  f/u ov/[name] re:  No chief complaint on file.    Dyspnea:  *** Cough: *** Sleeping: *** SABA use: *** 02: ***   No obvious day to day or daytime variability or assoc excess/ purulent sputum or mucus plugs or hemoptysis or cp or chest tightness, subjective wheeze or overt sinus or hb symptoms.   *** without nocturnal  or  early am exacerbation  of respiratory  c/o's or need for noct saba. Also denies any obvious fluctuation of symptoms with weather or environmental changes or other aggravating or alleviating factors except as outlined above   No unusual exposure hx or h/o childhood pna/ asthma or knowledge of premature birth.  Current Allergies, Complete Past Medical History, Past Surgical History, Family History, and Social History were reviewed in [employer] record.  ROS  The following are not active complaints unless bolded Hoarseness, sore throat, dysphagia, dental problems, itching, sneezing,  nasal congestion or discharge of excess mucus or purulent secretions, ear ache,   fever, chills, sweats, unintended wt loss or wt gain, classically pleuritic or exertional cp,  orthopnea pnd or arm/hand swelling  or leg swelling, presyncope, palpitations, abdominal pain, anorexia, nausea, vomiting, diarrhea  or change in bowel habits or change in bladder habits, change in stools or change in urine, dysuria, hematuria,  rash, arthralgias, visual complaints, headache, numbness, weakness or ataxia or problems with walking or coordination,  change in mood or  memory.        No outpatient medications have been marked as taking for the 04/19/20 encounter (Appointment) with Tanda Rockers, MD.               Objective:   Physical Exam     04/19/2020     ***  02/08/13 251 lb (113.853 kg)  02/02/13 249 lb 12.5 oz (113.3 kg)  02/01/13 249 lb 6.4 oz (113.127 kg)  Baseline wt 165-170 before post partum around [place]:

## 2020-04-26 ENCOUNTER — Telehealth: Payer: Self-pay | Admitting: Internal Medicine

## 2020-04-26 NOTE — Progress Notes (Signed)
  Chronic Care Management   Note  04/26/2020 Name: [name] MRN: [id number] DOB: July 09, 1975  [name] is a 44 y.o. year old female who is a primary care patient of Hoyt Koch, MD. I reached out to [name] by phone today in response to a referral sent by Ms. [name]'s PCP, Hoyt Koch, MD.   [name] was given information about Chronic Care Management services today including:  1. CCM service includes personalized support from designated clinical staff supervised by her physician, including individualized plan of care and coordination with other care providers 2. 24/7 contact phone numbers for assistance for urgent and routine care needs. 3. Service will only be billed when office clinical staff spend 20 minutes or more in a month to coordinate care. 4. Only one practitioner may furnish and bill the service in a calendar month. 5. The patient may stop CCM services at any time (effective at the end of the month) by phone call to the office staff.   Patient agreed to services and verbal consent obtained.   Follow up plan:   [name] [employer] Scheduler

## 2020-04-26 NOTE — Progress Notes (Signed)
  Chronic Care Management   Outreach Note  04/26/2020 Name: [name] MRN: [id number] DOB: 15-Dec-1975  Referred by: Hoyt Koch, MD Reason for referral : No chief complaint on file.   An unsuccessful telephone outreach was attempted today. The patient was referred to the pharmacist for assistance with care management and care coordination.   Follow Up Plan:   [name] [employer] Scheduler

## 2020-04-30 ENCOUNTER — Telehealth: Payer: Self-pay

## 2020-04-30 ENCOUNTER — Ambulatory Visit (INDEPENDENT_AMBULATORY_CARE_PROVIDER_SITE_OTHER): Payer: HMO

## 2020-04-30 DIAGNOSIS — I5042 Chronic combined systolic (congestive) and diastolic (congestive) heart failure: Secondary | ICD-10-CM

## 2020-04-30 DIAGNOSIS — Z95810 Presence of automatic (implantable) cardiac defibrillator: Secondary | ICD-10-CM | POA: Diagnosis not present

## 2020-04-30 NOTE — Progress Notes (Signed)
EPIC Encounter for ICM Monitoring  Patient Name: [name] is a 44 y.o. female Date: 04/30/2020 Primary Care Physican: Hoyt Koch, MD Primary Cardiologist:Bensimhon Electrophysiologist: Caryl Comes 11/16/2021OfficeWeight: 223lbs  Attempted call to patient and unable to reach.  Left detailed message per DPR regarding transmission. Transmission reviewed.   HeartlogicHFIndexincreased from 6 to 43 suggesting possible fluid accumulation.  Decreased thoracic impedance and increase in S3  Prescribed:  Torsemide20 mgTake1tablet (20 mg total) by mouthdaily.  Potassium10 mEq take4tablets(40 mEq total)twice a day.  Labs: 12/05/2019 Creatinine0.88, BUN9, Potassium3.6, Sodium137, GFR>60 11/28/2019 Creatinine0.90, BUN6, Potassium3.5, Sodium137, GFR>60  11/27/2019 Creatinine0.79, BUN9, Potassium3.4, Sodium137, GFR>60  11/23/2019 Creatinine0.84, BUN9, Potassium4.1, Sodium138, GFR>60 11/17/2019 Creatinine1.04, BUN15, Potassium4.9, Sodium134, GFR>60 11/16/2019 Creatinine1.20, BUN21, Potassium4.4, Sodium135, GFR55->60  11/15/2019 Creatinine1.81, BUN28, Potassium4.0, Sodium135, [id number]  A complete set of results can be found in Results Review.  Recommendations:Left voice mail with ICM number and encouraged to call if experiencing any fluid symptoms.  Patient self adjusts Torsemide as needed.    Follow-up plan: ICM clinic phone appointment on 05/07/2020 to recheck fluid levels.91 day device clinic remote transmission1/25/2022.   EP/Cardiology next office visit: Patient has canceled 3 previousappointments with HF clinic in August/Sept/Oct.  Copy of ICM check sent to Dr. Caryl Comes and Dr Haroldine Laws.  3 Month Trend    8 Day Data Trend          [name], RN 04/30/2020 9:19 AM

## 2020-04-30 NOTE — Telephone Encounter (Signed)
Remote ICM transmission received.  Attempted call to patient regarding ICM remote transmission and left detailed message per DPR to return call.  Advised to return call for any fluid symptoms or questions.      

## 2020-04-30 NOTE — Progress Notes (Signed)
Spoke with patient.  She reports SOB when going up the stairs, abdomen feels fuller, feet/ankles have been swelling for past week and feeling more tired than usual.  She took 1.5 tablets Torsemide today and will take same dosage for next 2 days. She said she is unable to double Torsemide due to it causes her to be very weak.  Advised to take 1 extra Potassium when taking extra Torsemide x next 2 days.  Advised to call back if symptoms worsen.

## 2020-05-02 ENCOUNTER — Encounter (HOSPITAL_COMMUNITY): Payer: Self-pay

## 2020-05-03 NOTE — Telephone Encounter (Signed)
2nd attempt to reach pt by phone left VM requesting return call.

## 2020-05-07 ENCOUNTER — Ambulatory Visit (INDEPENDENT_AMBULATORY_CARE_PROVIDER_SITE_OTHER): Payer: HMO

## 2020-05-07 DIAGNOSIS — I5042 Chronic combined systolic (congestive) and diastolic (congestive) heart failure: Secondary | ICD-10-CM

## 2020-05-07 DIAGNOSIS — Z95810 Presence of automatic (implantable) cardiac defibrillator: Secondary | ICD-10-CM

## 2020-05-07 NOTE — Progress Notes (Signed)
EPIC Encounter for ICM Monitoring  Patient Name: [name] is a 44 y.o. female Date: 05/07/2020 Primary Care Physican: Hoyt Koch, MD Primary Cardiologist:Bensimhon Electrophysiologist: Caryl Comes 11/16/2021OfficeWeight: 223lbs  Spoke with patient and reports shortness of breath and leg swelling have resolved.  She feels really weak after taking extra Torsemide x 3 days straight but once fluid is resolved she feel back to normal.  Advised when self adjusting Torsemide she may need to space out extra Torsemide dosages and not to take 3 days in a row.    HeartlogicHFIndex suggesting fluid levels returned to normal since Indexdecreased from 43 to 2 since taking extra Torsemide x 3 days.   Prescribed:  Torsemide20 mgTake1tablet (20 mg total) by mouthdaily.  Potassium10 mEq take4tablets(40 mEq total)twice a day.  Labs: 12/05/2019 Creatinine0.88, BUN9, Potassium3.6, Sodium137, GFR>60 11/28/2019 Creatinine0.90, BUN6, Potassium3.5, Sodium137, GFR>60  11/27/2019 Creatinine0.79, BUN9, Potassium3.4, Sodium137, GFR>60  11/23/2019 Creatinine0.84, BUN9, Potassium4.1, Sodium138, GFR>60 11/17/2019 Creatinine1.04, BUN15, Potassium4.9, Sodium134, GFR>60 11/16/2019 Creatinine1.20, BUN21, Potassium4.4, Sodium135, GFR55->60  11/15/2019 Creatinine1.81, BUN28, Potassium4.0, Sodium135, [id number]  A complete set of results can be found in Results Review.  Recommendations:No changes and encouraged to call if experiencing any fluid symptoms.  Follow-up plan: ICM clinic phone appointment on1/02/2021.91 day device clinic remote transmission1/25/2022.   EP/Cardiology next office visit: Patient has canceled 3 previousappointments with HF clinic in August/Sept/Oct.  Copy of ICM check sent to Dr. Caryl Comes.  3 Month Trend    8 Day Data Trend          [name], RN 05/07/2020 4:56 PM

## 2020-05-08 ENCOUNTER — Other Ambulatory Visit: Payer: Self-pay

## 2020-05-08 NOTE — Patient Outreach (Signed)
  [facility]) Care Management Chronic Special Needs Program    05/08/2020  Name: [name], DOB: 05-30-1975  MRN: [id number]   [name] is enrolled in a chronic special needs plan for Heart Failure. [facility] will continue to provide services for this member through 05/24/2020. The [employer] Care Management Team will assume care 05/25/2020.   Thea Silversmith, RN, MSN, [place] [phone]

## 2020-05-21 ENCOUNTER — Encounter: Payer: Self-pay | Admitting: Internal Medicine

## 2020-05-22 ENCOUNTER — Ambulatory Visit: Payer: HMO | Admitting: Internal Medicine

## 2020-05-22 DIAGNOSIS — Z0289 Encounter for other administrative examinations: Secondary | ICD-10-CM

## 2020-05-30 ENCOUNTER — Other Ambulatory Visit: Payer: Self-pay

## 2020-05-30 ENCOUNTER — Telehealth (HOSPITAL_COMMUNITY): Payer: Self-pay | Admitting: Cardiology

## 2020-05-30 ENCOUNTER — Telehealth (INDEPENDENT_AMBULATORY_CARE_PROVIDER_SITE_OTHER): Payer: HMO | Admitting: Internal Medicine

## 2020-05-30 ENCOUNTER — Encounter: Payer: Self-pay | Admitting: Internal Medicine

## 2020-05-30 DIAGNOSIS — M545 Low back pain, unspecified: Secondary | ICD-10-CM | POA: Diagnosis not present

## 2020-05-30 DIAGNOSIS — J4531 Mild persistent asthma with (acute) exacerbation: Secondary | ICD-10-CM | POA: Diagnosis not present

## 2020-05-30 DIAGNOSIS — K589 Irritable bowel syndrome without diarrhea: Secondary | ICD-10-CM | POA: Diagnosis not present

## 2020-05-30 DIAGNOSIS — G8929 Other chronic pain: Secondary | ICD-10-CM

## 2020-05-30 DIAGNOSIS — I5042 Chronic combined systolic (congestive) and diastolic (congestive) heart failure: Secondary | ICD-10-CM

## 2020-05-30 MED ORDER — IVABRADINE HCL 7.5 MG PO TABS
7.5000 mg | ORAL_TABLET | Freq: Two times a day (BID) | ORAL | 2 refills | Status: DC
Start: 2020-05-30 — End: 2020-09-02

## 2020-05-30 MED ORDER — DICLOFENAC SODIUM 1 % EX GEL: 4.0000 g | Freq: Four times a day (QID) | CUTANEOUS | 6 refills | Status: DC

## 2020-05-30 MED ORDER — MAGNESIUM OXIDE -MG SUPPLEMENT 200 MG PO TABS
200.0000 mg | ORAL_TABLET | Freq: Every day | ORAL | 3 refills | Status: DC
Start: 2020-05-30 — End: 2024-03-01

## 2020-05-30 MED ORDER — VITAMIN D (ERGOCALCIFEROL) 1.25 MG (50000 UNIT) PO CAPS
50000.0000 [IU] | ORAL_CAPSULE | ORAL | 0 refills | Status: DC
Start: 2020-05-30 — End: 2022-01-05

## 2020-05-30 MED ORDER — TORSEMIDE 20 MG PO TABS: 20.0000 mg | ORAL_TABLET | Freq: Every day | ORAL | 2 refills | Status: DC

## 2020-05-30 MED ORDER — BUDESONIDE-FORMOTEROL FUMARATE 160-4.5 MCG/ACT IN AERO
2.0000 | INHALATION_SPRAY | Freq: Two times a day (BID) | RESPIRATORY_TRACT | 3 refills | Status: DC | PRN
Start: 2020-05-30 — End: 2021-02-17

## 2020-05-30 MED ORDER — DICYCLOMINE HCL 20 MG PO TABS: 20.0000 mg | ORAL_TABLET | Freq: Every day | ORAL | 3 refills | Status: DC | PRN

## 2020-05-30 MED ORDER — NITROGLYCERIN 0.4 MG SL SUBL: 0.4000 mg | SUBLINGUAL_TABLET | SUBLINGUAL | 0 refills | Status: DC | PRN

## 2020-05-30 MED ORDER — CARVEDILOL 12.5 MG PO TABS: 18.7500 mg | ORAL_TABLET | Freq: Two times a day (BID) | ORAL | 2 refills | Status: DC

## 2020-05-30 MED ORDER — SACUBITRIL-VALSARTAN 97-103 MG PO TABS: 1.0000 | ORAL_TABLET | Freq: Two times a day (BID) | ORAL | 2 refills | Status: DC

## 2020-05-30 MED ORDER — LIDOCAINE 5 % EX PTCH
1.0000 | MEDICATED_PATCH | CUTANEOUS | 3 refills | Status: DC
Start: 2020-05-30 — End: 2022-01-05

## 2020-05-30 MED ORDER — AMIODARONE HCL 200 MG PO TABS
200.0000 mg | ORAL_TABLET | Freq: Every day | ORAL | 2 refills | Status: DC
Start: 2020-05-30 — End: 2020-08-27

## 2020-05-30 MED ORDER — GABAPENTIN 300 MG PO CAPS
300.0000 mg | ORAL_CAPSULE | Freq: Every evening | ORAL | 3 refills | Status: DC | PRN
Start: 2020-05-30 — End: 2024-03-06

## 2020-05-30 NOTE — Progress Notes (Signed)
Virtual Visit via Video Note  I connected with [name] on 05/30/20 at  3:00 PM EST by a video enabled telemedicine application and verified that I am speaking with the correct person using two identifiers.  The patient and the provider were at separate locations throughout the entire encounter. Patient location: [name], Provider location: work   I discussed the limitations of evaluation and management by telemedicine and the availability of in person appointments. The patient expressed understanding and agreed to proceed. The patient and the provider were the only parties present for the visit unless noted in HPI below.  History of Present Illness: The patient is a 45 y.o. female with visit for refill on her medications. She is not vaccinated for covid-19 and is staying at [name] all the time now. Both other family members are vaccinated. Denies current covid-19 symptoms. No current problems with fluid overload due to her heart failure. She is careful about diet most of the time. She denies ICD shock. Still with some chronic pain and needs refills on her medications to help. Stomach is stable. Last migraine beginning of December.  Observations/Objective: Appearance: normal, breathing appears normal, casual grooming, abdomen does not appear distended, throat not well visualized, memory normal, mental status is A and O times 3  Assessment and Plan: See problem oriented charting  Follow Up Instructions: Will refill meds as needed.   I discussed the assessment and treatment plan with the patient. The patient was provided an opportunity to ask questions and all were answered. The patient agreed with the plan and demonstrated an understanding of the instructions.   The patient was advised to call back or seek an in-person evaluation if the symptoms worsen or if the condition fails to improve as anticipated.  Myrlene Broker, MD

## 2020-05-30 NOTE — Assessment & Plan Note (Signed)
Refill gabapentin and lidoderm for pain as needed as well as voltaren gel.

## 2020-05-30 NOTE — Assessment & Plan Note (Signed)
Appears to be stable currently. Will let her heart provider know she wants medications filled and sent to another pharmacy as they will pill pack her meds.

## 2020-05-30 NOTE — Assessment & Plan Note (Signed)
Refill symbicort for prn and has not needed nebulizer in several months which is good. Counseled to get covid-19 vaccine which she is willing to do. Advised vaccines.gov to go to find and schedule appointment and recommended ASAP.

## 2020-05-30 NOTE — Assessment & Plan Note (Signed)
Refill bentyl which she uses as needed only.

## 2020-05-30 NOTE — Telephone Encounter (Signed)
As requested all cardiac medications refilled

## 2020-05-30 NOTE — Telephone Encounter (Signed)
-----   Message from Dolores Patty, MD sent at 05/30/2020  5:35 PM EST ----- Can you help me with this?  ----- Message ----- From: Myrlene Broker, MD Sent: 05/30/2020   3:57 PM EST To: Dolores Patty, MD  Please see prior mychart message, patient [facility] so they will pill pack her meds. Thanks Jarvis Newcomer

## 2020-05-31 ENCOUNTER — Other Ambulatory Visit: Payer: Self-pay

## 2020-05-31 ENCOUNTER — Telehealth: Payer: Self-pay | Admitting: Pharmacist

## 2020-05-31 NOTE — Progress Notes (Signed)
Chronic Care Management Pharmacy Assistant   Name: [name]  MRN: [id number] DOB: 1975/08/09  Reason for Encounter: Initial Questions   PCP : Hoyt Koch, MD  Allergies:   Allergies  Allergen Reactions   Vancomycin Other (See Comments)    "did something to my kidneys," PROGRESSED TO KIDNEY FAILURE!!   Aspirin Other (See Comments)    Wheezing, (Pt states that she just wheezes some when she takes aspirin by itself but she can take aspirin in a combination product).   Contrast Media [Iodinated Diagnostic Agents] Other (See Comments)    Multiple CT contrast studies done over 2 weeks caused ARF   Avocado Itching and Swelling   Ciprofloxacin Itching and Rash   Farxiga [Dapagliflozin] Rash   Sulfa Antibiotics Itching and Rash    Medications: Outpatient Encounter Medications as of 05/31/2020  Medication Sig   albuterol (PROAIR HFA) 108 (90 Base) MCG/ACT inhaler Inhale 1-2 puffs into the lungs every 6 (six) hours as needed for wheezing or shortness of breath.   amiodarone (PACERONE) 200 MG tablet Take 1 tablet (200 mg total) by mouth daily.   budesonide-formoterol (SYMBICORT) 160-4.5 MCG/ACT inhaler Inhale 2 puffs into the lungs 2 (two) times daily as needed.   butalbital-acetaminophen-caffeine (FIORICET) 50-325-40 MG tablet Take 1-2 tablets by mouth every 6 (six) hours as needed for headache.   carvedilol (COREG) 12.5 MG tablet Take 1.5 tablets (18.75 mg total) by mouth 2 (two) times daily.   colestipol (COLESTID) 1 g tablet Take 2 g by mouth daily as needed (IBS).   diclofenac Sodium (VOLTAREN) 1 % GEL Apply 4 g topically 4 (four) times daily.   dicyclomine (BENTYL) 20 MG tablet Take 1 tablet (20 mg total) by mouth daily as needed for spasms.   escitalopram (LEXAPRO) 10 MG tablet Take 1 tablet (10 mg total) by mouth daily.   fluticasone (FLONASE) 50 MCG/ACT nasal spray Place 2 sprays into both nostrils daily as needed for allergies.   gabapentin  (NEURONTIN) 300 MG capsule Take 1 capsule (300 mg total) by mouth at bedtime as needed (back spasms).   Ibuprofen-Famotidine (DUEXIS) 800-26.6 MG TABS Take 1 tablet by mouth 2 (two) times daily as needed.   ipratropium-albuterol (DUONEB) 0.5-2.5 (3) MG/3ML SOLN Take 3 mLs by nebulization every 6 (six) hours as needed.   ivabradine (CORLANOR) 7.5 MG TABS tablet Take 1 tablet (7.5 mg total) by mouth 2 (two) times daily with a meal.   lidocaine (LIDODERM) 5 % Place 1 patch onto the skin daily. Remove & Discard patch within 12 hours or as directed by MD   Magnesium Oxide 200 MG TABS Take 1 tablet (200 mg total) by mouth daily.   montelukast (SINGULAIR) 10 MG tablet Take 1 tablet (10 mg total) by mouth at bedtime.   Multiple Vitamins-Minerals (MULTIVITAMIN GUMMIES WOMENS PO) Take 2 tablets by mouth at bedtime.    nitroGLYCERIN (NITROSTAT) 0.4 MG SL tablet Place 1 tablet (0.4 mg total) under the tongue every 5 (five) minutes as needed for chest pain.   omeprazole (PRILOSEC) 20 MG capsule Take 20 mg by mouth daily as needed (for reflux symptoms).    sacubitril-valsartan (ENTRESTO) 97-103 MG Take 1 tablet by mouth 2 (two) times daily.   tiZANidine (ZANAFLEX) 4 MG tablet Take 4 mg by mouth every 8 (eight) hours as needed for muscle spasms.    torsemide (DEMADEX) 20 MG tablet Take 1 tablet (20 mg total) by mouth daily.   Ubrogepant (UBRELVY) 50 MG TABS  Take 50 mg by mouth every 2 (two) hours as needed (migraine).   Vitamin D, Ergocalciferol, (DRISDOL) 1.25 MG (50000 UNIT) CAPS capsule Take 1 capsule (50,000 Units total) by mouth every 7 (seven) days.   No facility-administered encounter medications on file as of 05/31/2020.    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   Myofascial pain 06/16/2017   Insomnia 05/07/2017   History of cardiomyopathy    Acute on chronic systolic (congestive) heart failure ([place]) 04/29/2017   Nonspecific chest pain 04/28/2017   ICD (implantable cardioverter-defibrillator), single, 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 The patient states that she had a video call yesterday with Dr. Sharlet Salina.  Any changes in your medications or health? The patient states that she has not had any changes in her medication or health  Any side effects from any medications? The patient did state that she is having some side effects to her medications but not sure which ones. She states that when she takes her blood pressure medications she gets nauseous and weak   Do you have an symptoms or problems not managed by your medications? The patient states that she does not have any symptoms or problems that are not managed by medications.  Any concerns about your health right now? The patient states that she does not have any concerns about her health at this time  Has your provider asked that you check blood pressure, blood sugar, or follow special diet at home? The patient does not check her blood pressure all the time but states that  it's been good. She also states that she does follow a special low sodium and fluid restricted diet.  Do you get any type of exercise on a regular basis? The patient states that she does walk about twice a week and goes to the pool at least once a week  Can you think of a goal you would like to reach for your health? The patient states that she does not have a goal for her health  Do you have any problems getting your medications? The patient states that she is transferring her meds to [facility] because they put them in a pill pack for her which will make it easier  Is there anything that you would like to discuss during the appointment? The patient states that there is nothing specific that she has to discuss about her health at this time   Please bring medications and supplements to appointment   Wendy Poet, [place] [phone]

## 2020-06-03 ENCOUNTER — Telehealth: Payer: Self-pay

## 2020-06-03 ENCOUNTER — Ambulatory Visit (INDEPENDENT_AMBULATORY_CARE_PROVIDER_SITE_OTHER): Payer: HMO

## 2020-06-03 ENCOUNTER — Other Ambulatory Visit: Payer: Self-pay

## 2020-06-03 ENCOUNTER — Telehealth: Payer: HMO

## 2020-06-03 DIAGNOSIS — Z95810 Presence of automatic (implantable) cardiac defibrillator: Secondary | ICD-10-CM | POA: Diagnosis not present

## 2020-06-03 DIAGNOSIS — I5042 Chronic combined systolic (congestive) and diastolic (congestive) heart failure: Secondary | ICD-10-CM | POA: Diagnosis not present

## 2020-06-03 NOTE — Chronic Care Management (AMB) (Deleted)
Chronic Care Management Pharmacy  Name: [name]  MRN: [id number] DOB: 1975-06-03   Chief Complaint/ HPI  [name],  45 y.o. , female presents for her Initial CCM visit with the clinical pharmacist via telephone due to COVID-19 Pandemic.  PCP : Hoyt Koch, MD Patient Care Team: Hoyt Koch, MD as PCP - General (Internal Medicine) Bensimhon, Shaune Pascal, MD as PCP - Advanced Heart Failure (Cardiology) Belva Crome, MD as PCP - Cardiology (Cardiology) Evans Lance, MD as PCP - Electrophysiology (Cardiology) Tanda Rockers, MD (Pulmonary Disease) Chucky May, MD (Psychiatry) Stanford Breed Denice Bors, MD (Cardiology) Rigoberto Noel, MD (Pulmonary Disease) Charlton Haws, [facility] as Pharmacist (Pharmacist)  [name] chronic conditions include: Hypertension, Heart Failure, Asthma, Depression and Anxiety, IBS, NICM, postpartum cardiomyopathy, Headache  Office Visits 05/30/20 Dr Sharlet Salina VV: chronic f/u for refills. Refilled dicyclomine, gabapentin and lidoderm. Counseled to get covid vaccine.   Consult Visit: 04/09/20 Dr Lovena Le (cardiology): f/u HF, ICD. No med changes.  02/15/20 ED visit: syncope while donating blood. Gave small fluid bolus and K-Dur.  01/17/20 Dr Tamala Julian (sports med): f/u for neck/back pain.  Subjective: ***  Objective: Allergies  Allergen Reactions  . Vancomycin Other (See Comments)    "did something to my kidneys," PROGRESSED TO KIDNEY FAILURE!!  Marland Kitchen Aspirin Other (See Comments)    Wheezing, (Pt states that she just wheezes some when she takes aspirin by itself but she can take aspirin in a combination product).  . Contrast Media [Iodinated Diagnostic Agents] Other (See Comments)    Multiple CT contrast studies done over 2 weeks caused ARF  . Avocado Itching and Swelling  . Ciprofloxacin Itching and Rash  . Farxiga [Dapagliflozin] Rash  . Sulfa Antibiotics Itching and Rash    Medications: Outpatient Encounter Medications  as of 06/03/2020  Medication Sig  . albuterol (PROAIR HFA) 108 (90 Base) MCG/ACT inhaler Inhale 1-2 puffs into the lungs every 6 (six) hours as needed for wheezing or shortness of breath.  Marland Kitchen amiodarone (PACERONE) 200 MG tablet Take 1 tablet (200 mg total) by mouth daily.  . budesonide-formoterol (SYMBICORT) 160-4.5 MCG/ACT inhaler Inhale 2 puffs into the lungs 2 (two) times daily as needed.  . butalbital-acetaminophen-caffeine (FIORICET) 50-325-40 MG tablet Take 1-2 tablets by mouth every 6 (six) hours as needed for headache.  . carvedilol (COREG) 12.5 MG tablet Take 1.5 tablets (18.75 mg total) by mouth 2 (two) times daily.  . colestipol (COLESTID) 1 g tablet Take 2 g by mouth daily as needed (IBS).  Marland Kitchen diclofenac Sodium (VOLTAREN) 1 % GEL Apply 4 g topically 4 (four) times daily.  Marland Kitchen dicyclomine (BENTYL) 20 MG tablet Take 1 tablet (20 mg total) by mouth daily as needed for spasms.  Marland Kitchen escitalopram (LEXAPRO) 10 MG tablet Take 1 tablet (10 mg total) by mouth daily.  . fluticasone (FLONASE) 50 MCG/ACT nasal spray Place 2 sprays into both nostrils daily as needed for allergies.  Marland Kitchen gabapentin (NEURONTIN) 300 MG capsule Take 1 capsule (300 mg total) by mouth at bedtime as needed (back spasms).  . Ibuprofen-Famotidine (DUEXIS) 800-26.6 MG TABS Take 1 tablet by mouth 2 (two) times daily as needed.  Marland Kitchen ipratropium-albuterol (DUONEB) 0.5-2.5 (3) MG/3ML SOLN Take 3 mLs by nebulization every 6 (six) hours as needed.  . ivabradine (CORLANOR) 7.5 MG TABS tablet Take 1 tablet (7.5 mg total) by mouth 2 (two) times daily with a meal.  . lidocaine (LIDODERM) 5 % Place 1 patch onto the skin  daily. Remove & Discard patch within 12 hours or as directed by MD  . Magnesium Oxide 200 MG TABS Take 1 tablet (200 mg total) by mouth daily.  . montelukast (SINGULAIR) 10 MG tablet Take 1 tablet (10 mg total) by mouth at bedtime.  . Multiple Vitamins-Minerals (MULTIVITAMIN GUMMIES WOMENS PO) Take 2 tablets by mouth at bedtime.    . nitroGLYCERIN (NITROSTAT) 0.4 MG SL tablet Place 1 tablet (0.4 mg total) under the tongue every 5 (five) minutes as needed for chest pain.  Marland Kitchen omeprazole (PRILOSEC) 20 MG capsule Take 20 mg by mouth daily as needed (for reflux symptoms).   . sacubitril-valsartan (ENTRESTO) 97-103 MG Take 1 tablet by mouth 2 (two) times daily.  Marland Kitchen tiZANidine (ZANAFLEX) 4 MG tablet Take 4 mg by mouth every 8 (eight) hours as needed for muscle spasms.   Marland Kitchen torsemide (DEMADEX) 20 MG tablet Take 1 tablet (20 mg total) by mouth daily.  Marland Kitchen Ubrogepant (UBRELVY) 50 MG TABS Take 50 mg by mouth every 2 (two) hours as needed (migraine).  . Vitamin D, Ergocalciferol, (DRISDOL) 1.25 MG (50000 UNIT) CAPS capsule Take 1 capsule (50,000 Units total) by mouth every 7 (seven) days.   No facility-administered encounter medications on file as of 06/03/2020.    Wt Readings from Last 3 Encounters:  04/09/20 223 lb 9.6 oz (101.4 kg)  01/17/20 219 lb (99.3 kg)  01/14/20 219 lb (99.3 kg)    Lab Results  Component Value Date   CREATININE 1.25 (H) 02/15/2020   BUN 15 02/15/2020   GFR 95.29 02/10/2019   GFRNONAA 52 (L) 02/15/2020   GFRAA >60 02/15/2020   NA 141 02/15/2020   K 3.0 (L) 02/15/2020   CALCIUM 9.2 02/15/2020   CO2 24 02/15/2020     Current Diagnosis/Assessment:    Goals Addressed   None     Heart Failure / Hypertension   Type: Systolic Last ejection fraction: <20% (11/13/2019) NYHA Class: II (slight limitation of activity) S/p ICD insertion  BP goal is:  <130/80 BP Readings from Last 3 Encounters:  04/09/20 (!) 142/86  02/15/20 (!) 138/110  01/17/20 (!) 116/92   Patient checks BP at home {CHL HP BP Monitoring Frequency:[phone]} Patient home BP readings are ranging: ***  Patient has failed these meds in past: *** Patient is currently {CHL Controlled/[phone]} on the following medications:  . Amiodarone 200 mg daily (PVCs) . Carvedilol 12.5 mg - 1.5 tab BID . Ivabradine 7.5 mg  BID . Entresto 97-103 mg BID . Torsemide 20 mg daily . Nitroglycerin 0.4 mg SL prn  We discussed {CHL HP Upstream Pharmacy discussion:[phone]}  Plan  Continue {CHL HP Upstream Pharmacy Plans:[phone]}   Asthma   Last spirometry score: ***  No flowsheet data found.  Patient has failed these meds in past: *** Patient is currently {CHL Controlled/[phone]} on the following medications:  Marland Kitchen Symbicort 160-4.5 mcg/act - 2 puffs BID . Albuterol HFA prn . Duoneb PRN . Montelukast 10 mg HS . Fluticasone nasal spray PRN  Using maintenance inhaler regularly? {yes/no:20286} Frequency of rescue inhaler use:  {CHL HP Upstream Pharm Inhaler KL:[id number]  We discussed:  {CHL HP Upstream Pharmacy discussion:[phone]}  Plan  Continue {CHL HP Upstream Pharmacy PH:[id number]  Depression / Anxiety   Depression screen [facility] 2/9 04/01/2020 12/13/2019 09/05/2019  Decreased Interest 0 3 0  Down, Depressed, Hopeless 0 3 0  PHQ - 2 Score 0 6 0  Altered sleeping - 1 -  Tired, decreased energy - 1 -  Change  in appetite - 1 -  Feeling bad or failure about yourself  - 3 -  Trouble concentrating - 3 -  Moving slowly or fidgety/restless - 0 -  Suicidal thoughts - 0 -  PHQ-9 Score - 15 -  Difficult doing work/chores - Somewhat difficult -  Some recent data might be hidden   GAD 7 : Generalized Anxiety Score 03/30/2017  Nervous, Anxious, on Edge 3  Control/stop worrying 3  Worry too much - different things 3  Trouble relaxing 3  Restless 1  Easily annoyed or irritable 1  Afraid - awful might happen 2  Total GAD 7 Score 16   Patient has failed these meds in past: *** Patient is currently {CHL Controlled/[phone]} on the following medications:  . Escitalopram 10 mg daily  We discussed:  ***  Plan  Continue {CHL HP Upstream Pharmacy Plans:[phone]}  GERD / IBS   Patient has failed these meds in past: *** Patient is currently {CHL  Controlled/[phone]} on the following medications:  . Omeprazole 20 mg daily . Dicyclomine 20 mg PRN . Colestipol 1 g PRN  We discussed:  ***  Plan  Continue {CHL HP Upstream Pharmacy Plans:[phone]}  Chronic pain   R low back pain  Patient has failed these meds in past: *** Patient is currently {CHL Controlled/[phone]} on the following medications:  . Ibuprofen-Famotidine (Duexis) 800-26.6 BID prn . Gabapentin 300 mg HS prn . Tizanidine 4 mg q8h PRN . Voltaren gel PRN . Lidocaine 5% patch PRN  We discussed:  ***  Plan  Continue {CHL HP Upstream Pharmacy Plans:[phone]}  Migraines   Patient has failed these meds in past: *** Patient is currently {CHL Controlled/[phone]} on the following medications:  . Butalbital-APAP-caffeine 50-325-40 mg q6h PRN . Ubrelvy 50 mg PRN  We discussed:  ***  Plan  Continue {CHL HP Upstream Pharmacy TSVXB:[id number]}  Health Maintenance    Patient is currently {CHL Controlled/[phone]} on the following medications:  Marland Kitchen Magnesium oxide 200 mg daily . Multivitamin . Vitamin D 50,000 IU weekly  We discussed:  ***  Plan  Continue {CHL HP Upstream Pharmacy RAQTM:[id number]}  Medication Management   [name] preferred pharmacy is:  [facility] [address], [place] [address] Phone: [phone] Fax: [place], [place], [place] - [address] [place] [address] Phone: [phone] Fax: [phone]  Uses pill box? {Yes or If no, why not?:20788} Pt endorses ***% compliance  We discussed: {Pharmacy options:24294}  Plan  {US Pharmacy YYQM:25003}    Follow up: *** month phone visit  ***

## 2020-06-03 NOTE — Telephone Encounter (Signed)
Remote ICM transmission received.  Attempted call to patient regarding ICM remote transmission and left detailed message per DPR.  Advised to return call for any fluid symptoms or questions.  

## 2020-06-03 NOTE — Progress Notes (Signed)
EPIC Encounter for ICM Monitoring  Patient Name: [name] is a 45 y.o. female Date: 06/03/2020 Primary Care Physican: Hoyt Koch, MD Primary Cardiologist:Bensimhon Electrophysiologist: Caryl Comes 11/16/2021OfficeWeight: 223lbs  Attempted call to patient and unable to reach.  Left detailed message per DPR regarding transmission. Transmission reviewed.   HeartlogicHFIndex 33 suggesting possible fluid accumulation starting 05/14/2020.  Prescribed:  Torsemide20 mgTake1tablet (20 mg total) by mouthdaily.  Potassium10 mEq take4tablets(40 mEq total)twice a day.  Labs: 02/15/2020 Creatinine 1.25, BUN 15, Potassium 3.0, Sodium 141, GFR 52->60 12/05/2019 Creatinine0.88, BUN9, Potassium3.6, Sodium137, GFR>60 11/28/2019 Creatinine0.90, BUN6, Potassium3.5, Sodium137, GFR>60  11/27/2019 Creatinine0.79, BUN9, Potassium3.4, Sodium137, GFR>60  11/23/2019 Creatinine0.84, BUN9, Potassium4.1, Sodium138, GFR>60 11/17/2019 Creatinine1.04, BUN15, Potassium4.9, Sodium134, GFR>60 11/16/2019 Creatinine1.20, BUN21, Potassium4.4, Sodium135, GFR55->60  11/15/2019 Creatinine1.81, BUN28, Potassium4.0, Sodium135, JJO84-16  A complete set of results can be found in Results Review.  Recommendations:Left voice mail with ICM number and encouraged to call if experiencing any fluid symptoms.  Pt self adjusts Torsemide when needed.  Follow-up plan: ICM clinic phone appointment on1/17/2022 to recheck fluid levels.91 day device clinic remote transmission1/25/2022.   EP/Cardiology next office visit: Patient has canceled 3 previousappointments with HF clinic in August/Sept/Oct.  Copy of ICM check sent to Dr. Caryl Comes and Dr Haroldine Laws.   3 month ICM trend: 06/03/2020.        [name], RN 06/03/2020 3:19 PM

## 2020-06-10 ENCOUNTER — Telehealth: Payer: Self-pay | Admitting: *Deleted

## 2020-06-10 ENCOUNTER — Telehealth: Payer: Self-pay

## 2020-06-10 ENCOUNTER — Ambulatory Visit (INDEPENDENT_AMBULATORY_CARE_PROVIDER_SITE_OTHER): Payer: HMO

## 2020-06-10 DIAGNOSIS — I5042 Chronic combined systolic (congestive) and diastolic (congestive) heart failure: Secondary | ICD-10-CM

## 2020-06-10 DIAGNOSIS — Z95810 Presence of automatic (implantable) cardiac defibrillator: Secondary | ICD-10-CM

## 2020-06-10 NOTE — Progress Notes (Signed)
EPIC Encounter for ICM Monitoring  Patient Name: [name] is a 45 y.o. female Date: 06/10/2020 Primary Care Physican: Hoyt Koch, MD Primary Cardiologist:Bensimhon Electrophysiologist: Caryl Comes 11/16/2021OfficeWeight: 223lbs  Attempted call to patient and unable to reach.  Left detailed message per DPR regarding transmission. Transmission reviewed.   HeartlogicHFIndeximproving and has decreased from 33 to 24 suggesting but still suggesting possible fluid accumulation starting 05/14/2020.  Prescribed:  Torsemide20 mgTake1tablet (20 mg total) by mouthdaily.  Potassium10 mEq take4tablets(40 mEq total)twice a day.  Labs: 02/15/2020 Creatinine 1.25, BUN 15, Potassium 3.0, Sodium 141, GFR 52->60 12/05/2019 Creatinine0.88, BUN9, Potassium3.6, Sodium137, GFR>60 11/28/2019 Creatinine0.90, BUN6, Potassium3.5, Sodium137, GFR>60  11/27/2019 Creatinine0.79, BUN9, Potassium3.4, Sodium137, GFR>60  11/23/2019 Creatinine0.84, BUN9, Potassium4.1, Sodium138, GFR>60 11/17/2019 Creatinine1.04, BUN15, Potassium4.9, Sodium134, GFR>60 11/16/2019 Creatinine1.20, BUN21, Potassium4.4, Sodium135, GFR55->60  11/15/2019 Creatinine1.81, BUN28, Potassium4.0, Sodium135, PXT06-26  A complete set of results can be found in Results Review.  Recommendations:Left voice mail with ICM number and encouraged to call if experiencing any fluid symptoms.  Advised to call Dr Bensimhon's office if having fluid symptoms.  Follow-up plan: ICM clinic phone appointment on2/14/2022.91 day device clinic remote transmission1/25/2022.   EP/Cardiology next office visit: Patient has canceled 3 previousappointments with HF clinic in August/Sept/Oct.  Copy of ICM check sent to Dr. Caryl Comes and Dr Haroldine Laws.  3 Month Trend    8 Day Data Trend          [name],  RN 06/10/2020 5:11 PM

## 2020-06-10 NOTE — Telephone Encounter (Signed)
Remote ICM transmission received.  Attempted call to patient regarding ICM remote transmission and left detailed message per DPR.  Advised to return call for any fluid symptoms or questions or call Dr Bensimhon's office if experiencing fluid symptoms.[name]

## 2020-06-10 NOTE — Telephone Encounter (Signed)
Lidocaine 5 % patches PA initiated via CoverMyMeds   Key: [id number]  PA Case ID: [id number]

## 2020-06-17 ENCOUNTER — Encounter: Payer: Self-pay | Admitting: Family Medicine

## 2020-06-18 ENCOUNTER — Other Ambulatory Visit: Payer: Self-pay

## 2020-06-18 ENCOUNTER — Ambulatory Visit (INDEPENDENT_AMBULATORY_CARE_PROVIDER_SITE_OTHER): Payer: HMO

## 2020-06-18 ENCOUNTER — Ambulatory Visit (INDEPENDENT_AMBULATORY_CARE_PROVIDER_SITE_OTHER): Payer: HMO | Admitting: Internal Medicine

## 2020-06-18 ENCOUNTER — Encounter: Payer: Self-pay | Admitting: Internal Medicine

## 2020-06-18 VITALS — BP 130/80 | HR 62 | Temp 98.5°F | Resp 18 | Ht 65.0 in | Wt 221.6 lb

## 2020-06-18 DIAGNOSIS — I5042 Chronic combined systolic (congestive) and diastolic (congestive) heart failure: Secondary | ICD-10-CM

## 2020-06-18 DIAGNOSIS — M25552 Pain in left hip: Secondary | ICD-10-CM | POA: Diagnosis not present

## 2020-06-18 MED ORDER — OMEPRAZOLE 20 MG PO CPDR
20.0000 mg | DELAYED_RELEASE_CAPSULE | Freq: Every day | ORAL | 0 refills | Status: DC | PRN
Start: 2020-06-18 — End: 2021-06-24

## 2020-06-18 MED ORDER — IBUPROFEN-FAMOTIDINE 800-26.6 MG PO TABS
1.0000 | ORAL_TABLET | Freq: Two times a day (BID) | ORAL | 3 refills | Status: DC | PRN
Start: 2020-06-18 — End: 2020-08-27

## 2020-06-18 MED ORDER — DICLOFENAC SODIUM 75 MG PO TBEC: 75.0000 mg | DELAYED_RELEASE_TABLET | Freq: Two times a day (BID) | ORAL | 0 refills | Status: DC

## 2020-06-18 MED ORDER — KETOROLAC TROMETHAMINE 30 MG/ML IJ SOLN
30.0000 mg | Freq: Once | INTRAMUSCULAR | Status: AC
  Administered 2020-06-18: 30 mg via INTRAMUSCULAR

## 2020-06-18 NOTE — Progress Notes (Signed)
   Subjective:   Patient ID: [name], female    DOB: 08-05-1975, 45 y.o.   MRN: [id number]  HPI The patient is a 45 YO female coming in for pain after a fall. She fell down stairs in the garage on 06/16/20. She is having pain 8-9/10 with movement worse left hip. Denies numbness or weakness in the leg. Is having problems with getting around. Has tried ibuprofen over the counter for pain with some relief. Overall not improving much. Denies falls since or instability in the left leg. Denies numbness or weakness. Pain does radiate from the buttocks down to the knee.   Review of Systems  Constitutional: Negative.   HENT: Negative.   Eyes: Negative.   Respiratory: Negative for cough, chest tightness and shortness of breath.   Cardiovascular: Negative for chest pain, palpitations and leg swelling.  Gastrointestinal: Negative for abdominal distention, abdominal pain, constipation, diarrhea, nausea and vomiting.  Musculoskeletal: Positive for arthralgias and myalgias.  Skin: Negative.   Neurological: Negative.   Psychiatric/Behavioral: Negative.     Objective:  Physical Exam Constitutional:      Appearance: She is well-developed and well-nourished.  HENT:     Head: Normocephalic and atraumatic.  Eyes:     Extraocular Movements: EOM normal.  Cardiovascular:     Rate and Rhythm: Normal rate and regular rhythm.  Pulmonary:     Effort: Pulmonary effort is normal. No respiratory distress.     Breath sounds: Normal breath sounds. No wheezing or rales.  Abdominal:     General: Bowel sounds are normal. There is no distension.     Palpations: Abdomen is soft.     Tenderness: There is no abdominal tenderness. There is no rebound.  Musculoskeletal:        General: Tenderness present. No edema.     Cervical back: Normal range of motion.     Comments: Pain left buttock and down the left IT band, no pain left groin  Skin:    General: Skin is warm and dry.  Neurological:     Mental Status:  She is alert and oriented to person, place, and time.     Coordination: Coordination normal.  Psychiatric:        Mood and Affect: Mood and affect normal.     Vitals:   06/18/20 0843  BP: 130/80  Pulse: 62  Resp: 18  Temp: 98.5 F (36.9 C)  TempSrc: Oral  SpO2: 95%  Weight: 221 lb 9.6 oz (100.5 kg)  Height: 5\' 5"  (1.651 m)    This visit occurred during the SARS-CoV-2 public health emergency.  Safety protocols were in place, including screening questions prior to the visit, additional usage of staff PPE, and extensive cleaning of exam room while observing appropriate contact time as indicated for disinfecting solutions.   Assessment & Plan:  Toradol 30 mg IM given at visit

## 2020-06-18 NOTE — Telephone Encounter (Signed)
Lidocaine patch has been approved through 06/10/2021

## 2020-06-18 NOTE — Patient Instructions (Signed)
We have sent in diclofenac to take 1 pill twice a day for the pain. Take omeprazole 1 pill daily to help avoid stomach irritation while taking.  We have given you the duexis prescription. If you fill this do not take the diclofenac.  We are getting and x-ray today.

## 2020-06-18 NOTE — Assessment & Plan Note (Signed)
Getting x-ray to rule out occult fracture although we discussed likely muscular. Refilled duexis for pain. Separate rx done for voltaren and omeprazole to take if unable to afford. She knows not to take both. Given toradol 30 mg IM at visit to help with pain.

## 2020-06-19 LAB — CUP PACEART REMOTE DEVICE CHECK
Battery Remaining Longevity: 132 mo
Battery Remaining Percentage: 92 %
Brady Statistic RV Percent Paced: 0 %
Date Time Interrogation Session: 20220125062100
HighPow Impedance: 62 Ohm
Implantable Lead Implant Date: 20180312
Implantable Lead Location: 753860
Implantable Lead Model: 293
Implantable Lead Serial Number: 422842
Implantable Pulse Generator Implant Date: 20180312
Lead Channel Impedance Value: 539 Ohm
Lead Channel Setting Pacing Amplitude: 2.5 V
Lead Channel Setting Pacing Pulse Width: 0.4 ms
Lead Channel Setting Sensing Sensitivity: 0.5 mV
Pulse Gen Serial Number: 226601

## 2020-06-19 NOTE — Telephone Encounter (Signed)
Latitude alert received- Heart logic index 38.   Spoke with pt, she reports she has had some increased SOB and a slight cough lately.  She also has had trouble sleeping requiring more pillows to sleep.    Pt confirms that she is taking Torsemide 20mg  daily.    Advised pt I would forward information to HF clinic for further instructions.  Educated on ED precaution for worsening condition.

## 2020-06-19 NOTE — Telephone Encounter (Signed)
Double torsemide for 3 days and give metolazone 2.5 mg daily for 2 days with kcl 40 extra each day. Repeat reading on Friday.

## 2020-06-20 NOTE — Telephone Encounter (Signed)
Attempted to reach patient to provide instructions.  No answer, LVM requesting callback with Device clinic # or to call HF clinic.

## 2020-06-25 ENCOUNTER — Ambulatory Visit (INDEPENDENT_AMBULATORY_CARE_PROVIDER_SITE_OTHER): Payer: HMO

## 2020-06-25 ENCOUNTER — Telehealth: Payer: Self-pay

## 2020-06-25 DIAGNOSIS — Z95810 Presence of automatic (implantable) cardiac defibrillator: Secondary | ICD-10-CM

## 2020-06-25 DIAGNOSIS — I5042 Chronic combined systolic (congestive) and diastolic (congestive) heart failure: Secondary | ICD-10-CM

## 2020-06-25 NOTE — Telephone Encounter (Signed)
Remote ICM transmission received.  Attempted call to patient regarding ICM remote transmission and left detailed message per DPR.  Advised to return call for any fluid symptoms or questions.

## 2020-06-25 NOTE — Progress Notes (Signed)
EPIC Encounter for ICM Monitoring  Patient Name: [name] is a 45 y.o. female Date: 06/25/2020 Primary Care Physican: Hoyt Koch, MD Primary Cardiologist:Bensimhon Electrophysiologist: Caryl Comes 11/16/2021OfficeWeight: 223lbs  Attempted call to patient and unable to reach. Left detailed message per DPR regarding transmission. Transmission reviewed.  HeartlogicHFIndex3 suggesting fluid levels returned to normal.  Prescribed:  Torsemide20 mgTake1tablet (20 mg total) by mouthdaily.  Potassium10 mEq take4tablets(40 mEq total)twice a day.  Labs: 02/15/2020 Creatinine 1.25, BUN 15, Potassium 3.0, Sodium 141, GFR 52->60 12/05/2019 Creatinine0.88, BUN9, Potassium3.6, Sodium137, GFR>60 11/28/2019 Creatinine0.90, BUN6, Potassium3.5, Sodium137, GFR>60  11/27/2019 Creatinine0.79, BUN9, Potassium3.4, Sodium137, GFR>60  11/23/2019 Creatinine0.84, BUN9, Potassium4.1, Sodium138, GFR>60 11/17/2019 Creatinine1.04, BUN15, Potassium4.9, Sodium134, GFR>60 11/16/2019 Creatinine1.20, BUN21, Potassium4.4, Sodium135, GFR55->60  11/15/2019 Creatinine1.81, BUN28, Potassium4.0, Sodium135, [id number]  A complete set of results can be found in Results Review.  Recommendations:Left voice mail with ICM number and encouraged to call if experiencing any fluid symptoms.Advised to call Dr Bensimhon's office if having fluid symptoms.  Follow-up plan: ICM clinic phone appointment on2/28/2022.91 day device clinic remote transmission4/26/2022.   EP/Cardiology next office visit: Patient has canceled 3 previousappointments with HF clinic in August/Sept/Oct.  Copy of ICM check sent to Dr. Caryl Comes.  3 Month Trend    8 Day Data Trend          [name], RN 06/25/2020 4:23 PM

## 2020-06-29 NOTE — Progress Notes (Signed)
Remote ICD transmission.   

## 2020-06-30 NOTE — Progress Notes (Signed)
[place] [address] [place] Phone: [phone] Subjective:   I [name] am serving as a Education administrator for Dr. Hulan Saas.  This visit occurred during the SARS-CoV-2 public health emergency.  Safety protocols were in place, including screening questions prior to the visit, additional usage of staff PPE, and extensive cleaning of exam room while observing appropriate contact time as indicated for disinfecting solutions.   I'm seeing this patient by the request  of:  Hoyt Koch, MD  CC: Hip and back pain  [id number]  [name] is a 45 y.o. female coming in with complaint of left hip pain. Last seen August 2021 for OMT. Since then patient fell down garage stairs on 06/16/2020. Saw Dr. Sharlet Salina after fall. Given Toradol injection. Also complaining of left leg pain. PCP thought the pain was IT band related. Numbness and tingling that goes down to her toes.   Onset- 06/16/20 Location- Posterior hip pain Duration- Pain is all day Character- Achy, throbbing  Aggravating factors- walking, leg raises (laterally) twisting motion Reliving factors-  Therapies tried- medication, heat and rest Severity- 9/10 at its worse Daily it is an 8  Left Hip xray 06/18/2020 IMPRESSION: No acute abnormality noted.    Past Medical History:  Diagnosis Date  . Acute on chronic systolic (congestive) heart failure ([place]) 04/29/2017  . Acute pain of right shoulder 06/16/2017  . AICD (automatic cardioverter/defibrillator) present   . Anxiety   . Arthritis    right shoulder   . Asthma   . CHF (congestive heart failure) ([place])   . Chronic combined systolic and diastolic heart failure ([place]) 03/05/2014  . Closed low lateral malleolus fracture 10/23/2013  . Cystitis 10/21/2017  . Depression   . Depression with anxiety 01/20/2013  . Diabetes mellitus without complication ([place])   . Diverticulosis   . Dyspnea    comes and goes intermittently mostly  with exertion   . Essential hypertension    Prev followed by [facility]/ Cardiology   . Fibroid    age 72  . Gallstones   . Generalized abdominal cramping   . History of cardiomyopathy   . Hypertension   . IBS (irritable bowel syndrome)   . 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 Insomnia 05/07/2017  . Labral tear of shoulder 04/04/2015    Injected 04/04/2015 Injected 12/03/2015   . Migraine    "monthly" (08/03/2016)  . Myofascial pain 06/16/2017  . NICM (nonischemic cardiomyopathy) ([place]) 08/03/2016  . Nonallopathic lesion of lumbosacral region 11/16/2016  . Nonallopathic lesion of sacral region 11/16/2016  . Nonallopathic lesion of thoracic region 08/20/2014  . Nonspecific chest pain 04/28/2017  . OSA (obstructive sleep apnea) 01/02/2013   NPSG 2009:  AHI 9/hr. CPAP intolerance >> "smothering" Good tolerance of auto device (optimal pressure 12-13 on download).  - referred to Dr Gwenette Greet    . OSA on CPAP   . Ovarian cyst    1999; surgically removed  . Patellofemoral syndrome of both knees 10/16/2016  . 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 PVC (premature ventricular contraction) 06/23/2016  . Seizures ([place])    "as a child" (08/03/2016)  . Termination of pregnancy    due to cardiac risk   Past Surgical History:  Procedure Laterality Date  . CARDIAC CATHETERIZATION N/A 11/11/2015   Procedure: Right/Left Heart Cath and Coronary Angiography;  Surgeon: Troy Sine, MD;  Location: [facility] [facility];  Service: Cardiovascular;  Laterality: N/A;  . CARDIAC CATHETERIZATION  ~ 2015  . CARDIAC DEFIBRILLATOR PLACEMENT  08/03/2016  . CESAREAN SECTION  1999  . COLONOSCOPY WITH PROPOFOL N/A 04/21/2016   Procedure: COLONOSCOPY WITH PROPOFOL;  Surgeon: Jerene Bears, MD;  Location: [facility];  Service: Gastroenterology;  Laterality: N/A;  . ESOPHAGOGASTRODUODENOSCOPY (EGD) WITH PROPOFOL N/A 04/21/2016   Procedure: ESOPHAGOGASTRODUODENOSCOPY  (EGD) WITH PROPOFOL;  Surgeon: Jerene Bears, MD;  Location: [facility];  Service: Gastroenterology;  Laterality: N/A;  . FOOT FRACTURE SURGERY Right ~ 2003  . FRACTURE SURGERY    . ICD IMPLANT N/A 08/03/2016   Procedure: ICD Implant;  Surgeon: Deboraha Sprang, MD;  Location: [facility];  Service: Cardiovascular;  Laterality: N/A;  . LAPAROSCOPIC CHOLECYSTECTOMY  12/2006  . LAPAROSCOPIC GASTRIC SLEEVE RESECTION    . LAPAROSCOPY ABDOMEN DIAGNOSTIC  2008   "cut bile duct w/gallbladder OR; had to go in later & fix leak; hospitalized for 2 months"  . LEFT HEART CATHETERIZATION WITH CORONARY ANGIOGRAM N/A 02/26/2014   Procedure: LEFT HEART CATHETERIZATION WITH CORONARY ANGIOGRAM;  Surgeon: Jettie Booze, MD;  Location: [facility] C[facility];  Service: Cardiovascular;  Laterality: N/A;  . OVARIAN CYST REMOVAL Right 1999  . PVC ABLATION N/A 12/01/2017   Procedure: PVC ABLATION;  Surgeon: Evans Lance, MD;  Location: [place] [facility];  Service: Cardiovascular;  Laterality: N/A;  . RIGHT HEART CATH N/A 08/05/2017   Procedure: RIGHT HEART CATH;  Surgeon: Jolaine Artist, MD;  Location: [place] [facility];  Service: Cardiovascular;  Laterality: N/A;  . RIGHT HEART CATH N/A 11/16/2019   Procedure: RIGHT HEART CATH;  Surgeon: Jolaine Artist, MD;  Location: [place] [facility];  Service: Cardiovascular;  Laterality: N/A;  . TUBAL LIGATION  1999   Social History   Socioeconomic History  . Marital status: Married    Spouse name: Not on file  . Number of children: 1  . Years of education: Not on file  . Highest education level: Not on file  Occupational History  . Occupation: stay at home mom  Tobacco Use  . Smoking status: Never Smoker  . Smokeless tobacco: Never Used  Vaping Use  . Vaping Use: Never used  Substance and Sexual Activity  . Alcohol use: No  . Drug use: No  . Sexual activity: Yes  Other Topics Concern  . Not on file  Social History Narrative  . Not on file    Social Determinants of Health   Financial Resource Strain: Not on file  Food Insecurity: No Food Insecurity  . Worried About Charity fundraiser in the Last Year: Never true  . Ran Out of Food in the Last Year: Never true  Transportation Needs: No Transportation Needs  . Lack of Transportation (Medical): No  . Lack of Transportation (Non-Medical): No  Physical Activity: Insufficiently Active  . Days of Exercise per Week: 2 days  . Minutes of Exercise per Session: 40 min  Stress: Not on file  Social Connections: Socially Isolated  . Frequency of Communication with Friends and Family: Twice a week  . Frequency of Social Gatherings with Friends and Family: Never  . Attends Religious Services: Never  . Active Member of Clubs or Organizations: No  . Attends Archivist Meetings: Never  . Marital Status: Married   Allergies  Allergen Reactions  . Vancomycin Other (See Comments)    "did something to my kidneys," PROGRESSED TO KIDNEY FAILURE!!  .  Aspirin Other (See Comments)    Wheezing, (Pt states that she just wheezes some when she takes aspirin by itself but she can take aspirin in a combination product).  . Contrast Media [Iodinated Diagnostic Agents] Other (See Comments)    Multiple CT contrast studies done over 2 weeks caused ARF  . Avocado Itching and Swelling  . Ciprofloxacin Itching and Rash  . Farxiga [Dapagliflozin] Rash  . Sulfa Antibiotics Itching and Rash   Family History  Problem Relation Age of Onset  . Emphysema Maternal Grandmother        smoked  . Heart disease Maternal Grandmother 69       MI  . Rheum arthritis Mother   . Allergies Daughter   . Colon cancer Neg Hx      Current Outpatient Medications (Cardiovascular):  .  amiodarone (PACERONE) 200 MG tablet, Take 1 tablet (200 mg total) by mouth daily. .  colestipol (COLESTID) 1 g tablet, Take 2 g by mouth daily as needed (IBS). Marland Kitchen  ivabradine (CORLANOR) 7.5 MG TABS tablet, Take 1 tablet (7.5 mg  total) by mouth 2 (two) times daily with a meal. .  nitroGLYCERIN (NITROSTAT) 0.4 MG SL tablet, Place 1 tablet (0.4 mg total) under the tongue every 5 (five) minutes as needed for chest pain. .  sacubitril-valsartan (ENTRESTO) 97-103 MG, Take 1 tablet by mouth 2 (two) times daily. .  carvedilol (COREG) 12.5 MG tablet, Take 1.5 tablets (18.75 mg total) by mouth 2 (two) times daily. Marland Kitchen  torsemide (DEMADEX) 20 MG tablet, Take 1 tablet (20 mg total) by mouth daily.  Current Outpatient Medications (Respiratory):  .  albuterol (PROAIR HFA) 108 (90 Base) MCG/ACT inhaler, Inhale 1-2 puffs into the lungs every 6 (six) hours as needed for wheezing or shortness of breath. .  budesonide-formoterol (SYMBICORT) 160-4.5 MCG/ACT inhaler, Inhale 2 puffs into the lungs 2 (two) times daily as needed. .  fluticasone (FLONASE) 50 MCG/ACT nasal spray, Place 2 sprays into both nostrils daily as needed for allergies. .  montelukast (SINGULAIR) 10 MG tablet, Take 1 tablet (10 mg total) by mouth at bedtime. Marland Kitchen  ipratropium-albuterol (DUONEB) 0.5-2.5 (3) MG/3ML SOLN, Take 3 mLs by nebulization every 6 (six) hours as needed.  Current Outpatient Medications (Analgesics):  .  butalbital-acetaminophen-caffeine (FIORICET) 50-325-40 MG tablet, Take 1-2 tablets by mouth every 6 (six) hours as needed for headache. .  diclofenac (VOLTAREN) 75 MG EC tablet, Take 1 tablet (75 mg total) by mouth 2 (two) times daily. .  Ibuprofen-Famotidine (DUEXIS) 800-26.6 MG TABS, Take 1 tablet by mouth 2 (two) times daily as needed. Marland Kitchen  Ubrogepant (UBRELVY) 50 MG TABS, Take 50 mg by mouth every 2 (two) hours as needed (migraine).   Current Outpatient Medications (Other):  .  diclofenac Sodium (VOLTAREN) 1 % GEL, Apply 4 g topically 4 (four) times daily. Marland Kitchen  dicyclomine (BENTYL) 20 MG tablet, Take 1 tablet (20 mg total) by mouth daily as needed for spasms. Marland Kitchen  escitalopram (LEXAPRO) 10 MG tablet, Take 1 tablet (10 mg total) by mouth daily. Marland Kitchen   gabapentin (NEURONTIN) 300 MG capsule, Take 1 capsule (300 mg total) by mouth at bedtime as needed (back spasms). .  lidocaine (LIDODERM) 5 %, Place 1 patch onto the skin daily. Remove & Discard patch within 12 hours or as directed by MD .  Magnesium Oxide 200 MG TABS, Take 1 tablet (200 mg total) by mouth daily. .  Multiple Vitamins-Minerals (MULTIVITAMIN GUMMIES WOMENS PO), Take 2 tablets by mouth at  bedtime.  Marland Kitchen  omeprazole (PRILOSEC) 20 MG capsule, Take 1 capsule (20 mg total) by mouth daily as needed (for reflux symptoms). Marland Kitchen  tiZANidine (ZANAFLEX) 4 MG tablet, Take 4 mg by mouth every 8 (eight) hours as needed for muscle spasms.  .  Vitamin D, Ergocalciferol, (DRISDOL) 1.25 MG (50000 UNIT) CAPS capsule, Take 1 capsule (50,000 Units total) by mouth every 7 (seven) days.   Reviewed prior external information including notes and imaging from  primary care provider As well as notes that were available from care everywhere and other healthcare systems.  Past medical history, social, surgical and family history all reviewed in electronic medical record.  No pertanent information unless stated regarding to the chief complaint.   Review of Systems:  No headache, visual changes, nausea, vomiting, diarrhea, constipation, dizziness, abdominal pain, skin rash, fevers, chills, night sweats, weight loss, swollen lymph nodes, body aches, joint swelling, chest pain, shortness of breath, mood changes. POSITIVE muscle aches  Objective  Blood pressure 122/88, pulse 91, height 5\' 5"  (1.651 m), weight 218 lb (98.9 kg), last menstrual period 06/28/2020, SpO2 98 %.   General: No apparent distress alert and oriented x3 mood and affect normal, dressed appropriately.  HEENT: Pupils equal, extraocular movements intact  Respiratory: [name] speak in full sentences and does not appear short of breath  Cardiovascular: No lower extremity edema, non tender, no erythema  Gait normal with good balance and coordination.   MSK: Left hip exam shows the patient has good range of motion.  Severely tender to palpation over the left sacroiliac joint but also over the greater trochanteric area.  Rates the severity of pain as high with both places.  Negative straight leg test.  5 out of 5 strength in lower extremity.  Positive FABER test.   Procedure: Real-time Ultrasound Guided Injection of left  greater trochanteric bursitis secondary to patient's body habitus Device: GE Logiq Q7  Ultrasound guided injection is preferred based studies that show increased duration, increased effect, greater accuracy, decreased procedural pain, increased response rate, and decreased cost with ultrasound guided versus blind injection.  Verbal informed consent obtained.  Time-out conducted.  Noted no overlying erythema, induration, or other signs of local infection.  Skin prepped in a sterile fashion.  Local anesthesia: Topical Ethyl chloride.  With sterile technique and under real time ultrasound guidance:  Greater trochanteric area was visualized and patient's bursa was noted. A 22-gauge 3 inch needle was inserted and 1 cc of 0.5% Marcaine and 1 cc of Kenalog 40 mg/dL was injected. Pictures taken Completed without difficulty  Pain immediately resolved suggesting accurate placement of the medication.  Advised to call if fevers/chills, erythema, induration, drainage, or persistent bleeding.   Impression: Technically successful ultrasound guided injection.  Osteopathic findings T7 extended rotated and side bent right L3 flexed rotated and side bent right Sacrum left on left   Impression and Recommendations:     The above documentation has been reviewed and is accurate and complete Lyndal Pulley, DO

## 2020-07-01 ENCOUNTER — Encounter: Payer: Self-pay | Admitting: Family Medicine

## 2020-07-01 ENCOUNTER — Ambulatory Visit: Payer: HMO | Admitting: Family Medicine

## 2020-07-01 ENCOUNTER — Ambulatory Visit: Payer: Self-pay

## 2020-07-01 ENCOUNTER — Ambulatory Visit (INDEPENDENT_AMBULATORY_CARE_PROVIDER_SITE_OTHER): Payer: HMO

## 2020-07-01 ENCOUNTER — Other Ambulatory Visit: Payer: Self-pay

## 2020-07-01 VITALS — BP 122/88 | HR 91 | Ht 65.0 in | Wt 218.0 lb

## 2020-07-01 DIAGNOSIS — G8929 Other chronic pain: Secondary | ICD-10-CM

## 2020-07-01 DIAGNOSIS — M7062 Trochanteric bursitis, left hip: Secondary | ICD-10-CM | POA: Diagnosis not present

## 2020-07-01 DIAGNOSIS — M545 Low back pain, unspecified: Secondary | ICD-10-CM

## 2020-07-01 DIAGNOSIS — M25552 Pain in left hip: Secondary | ICD-10-CM

## 2020-07-01 DIAGNOSIS — M999 Biomechanical lesion, unspecified: Secondary | ICD-10-CM | POA: Diagnosis not present

## 2020-07-01 NOTE — Assessment & Plan Note (Signed)
Patient has had low back pain previously.  Tightness on the right but now more left sacroiliac joint.  Responded well to osteopathic manipulation.  Also has more of a greater trochanteric bursitis which was given an injection.  Discussed home exercises and potentially for formal physical therapy.  Discussed topical anti-inflammatories with patient's other comorbidities.  Discussed continuing the gabapentin if it is helpful.  Follow-up again in 4 to 8 weeks

## 2020-07-01 NOTE — Assessment & Plan Note (Signed)
Patient given injection and tolerated the procedure well.  Discussed icing regimen and home exercises.  Discussed core stability.  Discussed icing regimen.  Increase activity slowly.  Follow-up again in 4 to 8 weeks worsening pain consider formal physical therapy

## 2020-07-01 NOTE — Patient Instructions (Addendum)
Good to see you SI joint exercises Ok to swim  Injection today See me as scheduled

## 2020-07-01 NOTE — Assessment & Plan Note (Signed)
   Decision today to treat with OMT was based on Physical Exam  After verbal consent patient was treated with HVLA, ME, FPR techniques in  thoracic,  lumbar and sacral areas, all areas are chronic   Patient tolerated the procedure well with improvement in symptoms  Patient given exercises, stretches and lifestyle modifications  See medications in patient instructions if given  Patient will follow up in 4-8 weeks 

## 2020-07-22 ENCOUNTER — Ambulatory Visit (INDEPENDENT_AMBULATORY_CARE_PROVIDER_SITE_OTHER): Payer: HMO

## 2020-07-22 DIAGNOSIS — Z95810 Presence of automatic (implantable) cardiac defibrillator: Secondary | ICD-10-CM

## 2020-07-22 DIAGNOSIS — I5042 Chronic combined systolic (congestive) and diastolic (congestive) heart failure: Secondary | ICD-10-CM

## 2020-07-22 NOTE — Progress Notes (Signed)
EPIC Encounter for ICM Monitoring  Patient Name: [name] is a 45 y.o. female Date: 07/22/2020 Primary Care Physican: Hoyt Koch, MD Primary Cardiologist:Bensimhon Electrophysiologist: Klein 1/25/2022OfficeWeight: 221lbs  Transmission reviewed.  HeartlogicHFIndex0 suggesting normal fluid levels.  Prescribed:  Torsemide20 mgTake1tablet (20 mg total) by mouthdaily.  Potassium10 mEq take4tablets(40 mEq total)twice a day.  Labs: 02/15/2020 Creatinine 1.25, BUN 15, Potassium 3.0, Sodium 141, GFR 52->60 12/05/2019 Creatinine0.88, BUN9, Potassium3.6, Sodium137, GFR>60 11/28/2019 Creatinine0.90, BUN6, Potassium3.5, Sodium137, GFR>60  11/27/2019 Creatinine0.79, BUN9, Potassium3.4, Sodium137, GFR>60  11/23/2019 Creatinine0.84, BUN9, Potassium4.1, Sodium138, GFR>60 11/17/2019 Creatinine1.04, BUN15, Potassium4.9, Sodium134, GFR>60 11/16/2019 Creatinine1.20, BUN21, Potassium4.4, Sodium135, GFR55->60  11/15/2019 Creatinine1.81, BUN28, Potassium4.0, Sodium135, YBO17-51  A complete set of results can be found in Results Review.  Recommendations: No changes.  Follow-up plan: ICM clinic phone appointment on4/08/2020.91 day device clinic remote transmission4/26/2022.   EP/Cardiology next office visit: Patient has canceled 3 previousappointments with HF clinic in August/Sept/Oct.  Copy of ICM check sent to Dr. Caryl Comes.  3 month ICM trend: 07/21/2020.    8 day data trend:       [name], RN 07/22/2020 11:02 AM

## 2020-07-29 ENCOUNTER — Ambulatory Visit: Payer: HMO | Admitting: Family Medicine

## 2020-07-29 ENCOUNTER — Encounter: Payer: Self-pay | Admitting: Family Medicine

## 2020-07-29 ENCOUNTER — Other Ambulatory Visit: Payer: Self-pay

## 2020-07-29 VITALS — BP 128/86 | Ht 65.0 in | Wt 229.0 lb

## 2020-07-29 DIAGNOSIS — M999 Biomechanical lesion, unspecified: Secondary | ICD-10-CM

## 2020-07-29 DIAGNOSIS — M24551 Contracture, right hip: Secondary | ICD-10-CM | POA: Diagnosis not present

## 2020-07-29 DIAGNOSIS — M2550 Pain in unspecified joint: Secondary | ICD-10-CM | POA: Diagnosis not present

## 2020-07-29 LAB — COMPREHENSIVE METABOLIC PANEL
ALT: 12 U/L (ref 0–35)
AST: 19 U/L (ref 0–37)
Albumin: 4 g/dL (ref 3.5–5.2)
Alkaline Phosphatase: 86 U/L (ref 39–117)
BUN: 10 mg/dL (ref 6–23)
CO2: 30 mEq/L (ref 19–32)
Calcium: 8.8 mg/dL (ref 8.4–10.5)
Chloride: 106 mEq/L (ref 96–112)
Creatinine, Ser: 0.72 mg/dL (ref 0.40–1.20)
GFR: 100.33 mL/min (ref >60.00)
Glucose, Bld: 101 mg/dL — ABNORMAL HIGH (ref 70–99)
Potassium: 3.5 mEq/L (ref 3.5–5.1)
Sodium: 142 mEq/L (ref 135–145)
Total Bilirubin: 0.3 mg/dL (ref 0.2–1.2)
Total Protein: 7.2 g/dL (ref 6.0–8.3)

## 2020-07-29 LAB — CBC WITH DIFFERENTIAL/PLATELET
Basophils Absolute: 0 10*3/uL (ref 0.0–0.1)
Basophils Relative: 0.5 % (ref 0.0–3.0)
Eosinophils Absolute: 0.1 10*3/uL (ref 0.0–0.7)
Eosinophils Relative: 1.7 % (ref 0.0–5.0)
HCT: 29.3 % — ABNORMAL LOW (ref 36.0–46.0)
Hemoglobin: 8.9 g/dL — ABNORMAL LOW (ref 12.0–15.0)
Lymphocytes Relative: 30.2 % (ref 12.0–46.0)
Lymphs Abs: 1.6 10*3/uL (ref 0.7–4.0)
MCHC: 30.5 g/dL (ref 30.0–36.0)
MCV: 66 fl — ABNORMAL LOW (ref 78.0–100.0)
Monocytes Absolute: 0.6 10*3/uL (ref 0.1–1.0)
Monocytes Relative: 12.3 % — ABNORMAL HIGH (ref 3.0–12.0)
Neutro Abs: 2.9 10*3/uL (ref 1.4–7.7)
Neutrophils Relative %: 55.3 % (ref 43.0–77.0)
Platelets: 272 10*3/uL (ref 150.0–400.0)
RBC: 4.44 Mil/uL (ref 3.87–5.11)
RDW: 19.3 % — ABNORMAL HIGH (ref 11.5–15.5)
WBC: 5.3 10*3/uL (ref 4.0–10.5)

## 2020-07-29 LAB — IBC PANEL
Iron: 16 ug/dL — ABNORMAL LOW (ref 42–145)
Saturation Ratios: 3.2 % — ABNORMAL LOW (ref 20.0–50.0)
Transferrin: 357 mg/dL (ref 212.0–360.0)

## 2020-07-29 LAB — SEDIMENTATION RATE: Sed Rate: 31 mm/hr — ABNORMAL HIGH (ref 0–20)

## 2020-07-29 LAB — VITAMIN D 25 HYDROXY (VIT D DEFICIENCY, FRACTURES): VITD: 12.6 ng/mL — ABNORMAL LOW (ref 30.00–100.00)

## 2020-07-29 NOTE — Patient Instructions (Signed)
Labs today PT will call you See me again in 4-6 weeks

## 2020-07-29 NOTE — Assessment & Plan Note (Signed)
Chronic problem with mild exacerbation.  [name] left side is feeling significantly better.  Patient did respond fairly well though to osteopathic manipulation.  X-rays do not show any true bony abnormality.  We will start with formal physical therapy.  Worsening pain will need advanced imaging including an MRI.  Laboratory work-up ordered today with patient having a mild bump in her creatinine in September.  No urinary tract symptoms noted though today.  Follow-up with me again in 4 to 6 weeks

## 2020-07-29 NOTE — Progress Notes (Signed)
[place] Phone: [phone] Subjective:   [name], am serving as a scribe for Dr. Hulan Saas. This visit occurred during the SARS-CoV-2 public health emergency.  Safety protocols were in place, including screening questions prior to the visit, additional usage of staff PPE, and extensive cleaning of exam room while observing appropriate contact time as indicated for disinfecting solutions.   I'm seeing this patient by the request  of:  Hoyt Koch, MD  CC: Low back pain follow-up  FKC:[id number]  [name] is a 45 y.o. female coming in with complaint of back and neck pain. OMT 07/01/2020. Patient states that she is having right-sided, constant, lower back pain.  This is more on the right side.  Patient does acute fall on the left side is not having any significant pain.  X-rays were taken at last exam but did not show any bony abnormality.  Patient states that though the pain on the right side seems to be moderate to severe.  Patient states and has had has been doing massage but not noticing significant improvement.  Using: Voltraen 75mg , Pennsaid, Lidocaine patches, heating pad.   Taking:         Reviewed prior external information including notes and imaging from previsou exam, outside providers and external EMR if available.   As well as notes that were available from care everywhere and other healthcare systems.  Past medical history, social, surgical and family history all reviewed in electronic medical record.  [name] pertanent information unless stated regarding to the chief complaint.   Past Medical History:  Diagnosis Date  . Acute on chronic systolic (congestive) heart failure ([place]) 04/29/2017  . Acute pain of right shoulder 06/16/2017  . AICD (automatic cardioverter/defibrillator) present   . Anxiety   . Arthritis    right shoulder   . Asthma   . CHF (congestive heart failure)  ([place])   . Chronic combined systolic and diastolic heart failure ([place]) 03/05/2014  . Closed low lateral malleolus fracture 10/23/2013  . Cystitis 10/21/2017  . Depression   . Depression with anxiety 01/20/2013  . Diabetes mellitus without complication ([place])   . Diverticulosis   . Dyspnea    comes and goes intermittently mostly with exertion   . Essential hypertension    Prev followed by [facility]/ Cardiology   . Fibroid    age 55  . Gallstones   . Generalized abdominal cramping   . History of cardiomyopathy   . Hypertension   . IBS (irritable bowel syndrome)   . 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 Insomnia 05/07/2017  . Labral tear of shoulder 04/04/2015    Injected 04/04/2015 Injected 12/03/2015   . Migraine    "monthly" (08/03/2016)  . Myofascial pain 06/16/2017  . NICM (nonischemic cardiomyopathy) ([place]) 08/03/2016  . Nonallopathic lesion of lumbosacral region 11/16/2016  . Nonallopathic lesion of sacral region 11/16/2016  . Nonallopathic lesion of thoracic region 08/20/2014  . Nonspecific chest pain 04/28/2017  . OSA (obstructive sleep apnea) 01/02/2013   NPSG 2009:  AHI 9/hr. CPAP intolerance >> "smothering" Good tolerance of auto device (optimal pressure 12-13 on download).  - referred to Dr Gwenette Greet    . OSA on CPAP   . Ovarian cyst    1999; surgically removed  . Patellofemoral syndrome of both knees 10/16/2016  . 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 PVC (premature ventricular contraction)  06/23/2016  . Seizures ([place])    "as a child" (08/03/2016)  . Termination of pregnancy    due to cardiac risk    Allergies  Allergen Reactions  . Vancomycin Other (See Comments)    "did something to my kidneys," PROGRESSED TO KIDNEY FAILURE!!  Marland Kitchen Aspirin Other (See Comments)    Wheezing, (Pt states that she just wheezes some when she takes aspirin by itself but she can take aspirin in a combination product).  . Contrast Media  [Iodinated Diagnostic Agents] Other (See Comments)    Multiple CT contrast studies done over 2 weeks caused ARF  . Avocado Itching and Swelling  . Ciprofloxacin Itching and Rash  . Farxiga [Dapagliflozin] Rash  . Sulfa Antibiotics Itching and Rash     Review of Systems:  [name] headache, visual changes, nausea, vomiting, diarrhea, constipation, dizziness, abdominal pain, skin rash, fevers, chills, night sweats, weight loss, swollen lymph nodes,  joint swelling, chest pain, shortness of breath, mood changes. POSITIVE muscle aches, body aches  Objective  Blood pressure 128/86, height 5\' 5"  (1.651 m), weight 229 lb (103.9 kg).   General: [name] apparent distress alert and oriented x3 mood and affect normal, dressed appropriately.  HEENT: Pupils equal, extraocular movements intact  Respiratory: [name] speak in full sentences and does not appear short of breath  Cardiovascular: [name] lower extremity edema, non tender, [name] erythema  Gait normal with good balance and coordination.  MSK:  Non tender with full range of motion and good stability and symmetric strength and tone of shoulders, elbows, wrist, hip, knee and ankles bilaterally.  Back -low back exam does show tightness noted in the paraspinal musculature.  Tightness on the right side more than left.  Mild tightness with FABER test as well.  Negative straight leg test.  5-5 strength in lower extremities.  Deep tendon reflexes are intact  Osteopathic findings   T8 extended rotated and side bent right L2 flexed rotated and side bent right Sacrum right on right       Assessment and Plan:  Chronic right-sided low back pain without sciatica Chronic problem with mild exacerbation.  [name] left side is feeling significantly better.  Patient did respond fairly well though to osteopathic manipulation.  X-rays do not show any true bony abnormality.  We will start with formal physical therapy.  Worsening pain will need advanced imaging including  an MRI.  Laboratory work-up ordered today with patient having a mild bump in her creatinine in September.  [name] urinary tract symptoms noted though today.  Follow-up with me again in 4 to 6 weeks    Nonallopathic problems  Decision today to treat with OMT was based on Physical Exam  After verbal consent patient was treated with HVLA, ME, FPR techniques in  thoracic, lumbar, and sacral  areas  Patient tolerated the procedure well with improvement in symptoms  Patient given exercises, stretches and lifestyle modifications  See medications in patient instructions if given  Patient will follow up in 4-8 weeks      The above documentation has been reviewed and is accurate and complete Lyndal Pulley, DO       Note: This dictation was prepared with Dragon dictation along with smaller phrase technology. Any transcriptional errors that result from this process are unintentional.

## 2020-07-30 ENCOUNTER — Encounter: Payer: Self-pay | Admitting: Family Medicine

## 2020-07-31 ENCOUNTER — Other Ambulatory Visit: Payer: Self-pay

## 2020-07-31 DIAGNOSIS — D649 Anemia, unspecified: Secondary | ICD-10-CM

## 2020-08-01 ENCOUNTER — Telehealth (HOSPITAL_COMMUNITY): Payer: Self-pay | Admitting: *Deleted

## 2020-08-01 ENCOUNTER — Telehealth: Payer: Self-pay | Admitting: Hematology and Oncology

## 2020-08-01 NOTE — Telephone Encounter (Signed)
Received a new hem referral from Dr. Tamala Julian at [place] for low hgb. [name] has been cld and scheduled to see Dr. Chryl Heck on 3/15 at 2pm. Pt aware to arrive 30 minutes early.

## 2020-08-01 NOTE — Telephone Encounter (Signed)
Pt left VM requesting an office visit. Pt said she has been experiencing extreme fatigue and shortness of breath. Pt said she notices some fluid retention as well. Called pt back to get more information and schedule appt but no answer/left vm requesting return call.

## 2020-08-06 ENCOUNTER — Inpatient Hospital Stay: Payer: HMO

## 2020-08-06 ENCOUNTER — Other Ambulatory Visit: Payer: Self-pay

## 2020-08-06 ENCOUNTER — Encounter: Payer: Self-pay | Admitting: Hematology and Oncology

## 2020-08-06 ENCOUNTER — Inpatient Hospital Stay: Payer: HMO | Attending: Hematology and Oncology | Admitting: Hematology and Oncology

## 2020-08-06 VITALS — BP 124/70 | HR 109 | Temp 97.0°F | Resp 20 | Ht 65.0 in | Wt 225.7 lb

## 2020-08-06 DIAGNOSIS — Z9884 Bariatric surgery status: Secondary | ICD-10-CM | POA: Diagnosis not present

## 2020-08-06 DIAGNOSIS — I5042 Chronic combined systolic (congestive) and diastolic (congestive) heart failure: Secondary | ICD-10-CM | POA: Diagnosis not present

## 2020-08-06 DIAGNOSIS — J45909 Unspecified asthma, uncomplicated: Secondary | ICD-10-CM | POA: Diagnosis not present

## 2020-08-06 DIAGNOSIS — K589 Irritable bowel syndrome without diarrhea: Secondary | ICD-10-CM | POA: Diagnosis not present

## 2020-08-06 DIAGNOSIS — I110 Hypertensive heart disease with heart failure: Secondary | ICD-10-CM | POA: Insufficient documentation

## 2020-08-06 DIAGNOSIS — E669 Obesity, unspecified: Secondary | ICD-10-CM | POA: Insufficient documentation

## 2020-08-06 DIAGNOSIS — E119 Type 2 diabetes mellitus without complications: Secondary | ICD-10-CM | POA: Insufficient documentation

## 2020-08-06 DIAGNOSIS — G4733 Obstructive sleep apnea (adult) (pediatric): Secondary | ICD-10-CM | POA: Insufficient documentation

## 2020-08-06 DIAGNOSIS — K909 Intestinal malabsorption, unspecified: Secondary | ICD-10-CM | POA: Diagnosis not present

## 2020-08-06 DIAGNOSIS — Z7951 Long term (current) use of inhaled steroids: Secondary | ICD-10-CM | POA: Insufficient documentation

## 2020-08-06 DIAGNOSIS — D500 Iron deficiency anemia secondary to blood loss (chronic): Secondary | ICD-10-CM | POA: Insufficient documentation

## 2020-08-06 DIAGNOSIS — Z8249 Family history of ischemic heart disease and other diseases of the circulatory system: Secondary | ICD-10-CM | POA: Insufficient documentation

## 2020-08-06 DIAGNOSIS — Z79899 Other long term (current) drug therapy: Secondary | ICD-10-CM | POA: Insufficient documentation

## 2020-08-06 DIAGNOSIS — N920 Excessive and frequent menstruation with regular cycle: Secondary | ICD-10-CM | POA: Insufficient documentation

## 2020-08-06 DIAGNOSIS — Z95810 Presence of automatic (implantable) cardiac defibrillator: Secondary | ICD-10-CM | POA: Insufficient documentation

## 2020-08-06 DIAGNOSIS — Z8261 Family history of arthritis: Secondary | ICD-10-CM | POA: Diagnosis not present

## 2020-08-06 LAB — COMPREHENSIVE METABOLIC PANEL
ALT: 24 U/L (ref 0–44)
AST: 25 U/L (ref 15–41)
Albumin: 4 g/dL (ref 3.5–5.0)
Alkaline Phosphatase: 101 U/L (ref 38–126)
Anion gap: 11 (ref 5–15)
BUN: 17 mg/dL (ref 6–20)
CO2: 28 mmol/L (ref 22–32)
Calcium: 9.1 mg/dL (ref 8.9–10.3)
Chloride: 104 mmol/L (ref 98–111)
Creatinine, Ser: 0.87 mg/dL (ref 0.44–1.00)
GFR, Estimated: >60 mL/min (ref >60)
Glucose, Bld: 92 mg/dL (ref 70–99)
Potassium: 4 mmol/L (ref 3.5–5.1)
Sodium: 143 mmol/L (ref 135–145)
Total Bilirubin: 0.3 mg/dL (ref 0.3–1.2)
Total Protein: 7.9 g/dL (ref 6.5–8.1)

## 2020-08-06 LAB — CBC WITH DIFFERENTIAL/PLATELET
Abs Immature Granulocytes: 0.01 10*3/uL (ref 0.00–0.07)
Basophils Absolute: 0 10*3/uL (ref 0.0–0.1)
Basophils Relative: 1 %
Eosinophils Absolute: 0.1 10*3/uL (ref 0.0–0.5)
Eosinophils Relative: 1 %
HCT: 32 % — ABNORMAL LOW (ref 36.0–46.0)
Hemoglobin: 9 g/dL — ABNORMAL LOW (ref 12.0–15.0)
Immature Granulocytes: 0 %
Lymphocytes Relative: 32 %
Lymphs Abs: 2 10*3/uL (ref 0.7–4.0)
MCH: 19.7 pg — ABNORMAL LOW (ref 26.0–34.0)
MCHC: 28.1 g/dL — ABNORMAL LOW (ref 30.0–36.0)
MCV: 69.9 fL — ABNORMAL LOW (ref 80.0–100.0)
Monocytes Absolute: 0.7 10*3/uL (ref 0.1–1.0)
Monocytes Relative: 11 %
Neutro Abs: 3.4 10*3/uL (ref 1.7–7.7)
Neutrophils Relative %: 55 %
Platelets: 297 10*3/uL (ref 150–400)
RBC: 4.58 MIL/uL (ref 3.87–5.11)
RDW: 17.6 % — ABNORMAL HIGH (ref 11.5–15.5)
WBC: 6.1 10*3/uL (ref 4.0–10.5)
nRBC: 0 % (ref 0.0–0.2)

## 2020-08-06 LAB — VITAMIN B12: Vitamin B-12: 119 pg/mL — ABNORMAL LOW (ref 180–914)

## 2020-08-06 NOTE — Progress Notes (Signed)
[place] NOTE  Patient Care Team: Hoyt Koch, MD as PCP - General (Internal Medicine) Bensimhon, Shaune Pascal, MD as PCP - Advanced Heart Failure (Cardiology) Belva Crome, MD as PCP - Cardiology (Cardiology) Evans Lance, MD as PCP - Electrophysiology (Cardiology) Tanda Rockers, MD (Pulmonary Disease) Chucky May, MD (Psychiatry) Lelon Perla, MD (Cardiology) Rigoberto Noel, MD (Pulmonary Disease) Charlton Haws, [facility] as Pharmacist (Pharmacist)  CHIEF COMPLAINTS/PURPOSE OF CONSULTATION:  Anemia  ASSESSMENT & PLAN:  No problem-specific Assessment & Plan notes found for this encounter.  No orders of the defined types were placed in this encounter.  #1.  Iron deficiency anemia likely related to chronic blood loss secondary to menstrual cycles also compounded with some malabsorption given her gastric sleeve surgery She has had anemia since the past 3 years which has progressively worsened. Iron panel suggestive of iron deficiency.  Since she has no new gastrointestinal symptoms, I do believe it is reasonable to do iron replacement for her iron deficiency anemia and if she continues to have recurrent iron deficiency, she may have to do another colonoscopy. We have discussed about multiple iron formularies including Venofer and Injectafer.  Have discussed that rarely 1% of the patients may have severe allergic reaction from iron infusion but most patients tolerated well, most common side effects are some flulike symptoms from the iron infusion.  She is agreeable to intravenous iron.  Have also ordered a repeat CBC, ferritin, Z61, folic acid levels and CMP today. #2.  Obesity, status post gastric sleeve surgery. 3.  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  IBS, follows up with gastroenterology annually, last colonoscopy in 2017. 5.  Age-appropriate cancer screening recommended, patient due for  mammogram.  HISTORY OF PRESENTING ILLNESS:  [name] 45 y.o. female is here because of IDA  This is a very pleasant 45 year old female patient with a past medical history significant for congestive heart failure, type 2 diabetes which has resolved since she had her gastric sleeve procedure, hypertension, irritable bowel syndrome, obstructive sleep apnea on CPAP referred to hematology for recommendations regarding iron deficiency anemia.  Ms. Dia arrived to the appointment today by herself.  She tells me that she donated blood in September and since then she wondered if her anemia has started.  She passed out during the blood transfusion and had to be brought by EMS to the ER.  She denies any blood loss in her stool or change in her bowel habits.  She has history of IBS and follows up with gastroenterology annually.  She had her last colonoscopy in 2017 which did not show any evidence of premalignant or malignant lesions except for some diverticulosis.  She continues to have regular menstrual cycles, denies any overt blood loss but has some regularly.  She does not remember needing any iron or blood transfusions.  She had gastric sleeve surgery in 2019 and takes some multivitamins since then.  She has an ejection fraction of 25% and has an AICD in place. Her most notable complaint is feeling tired, some dizziness and craving for cold drinks. Rest of the 10 point ROS as mentioned below are negative.  REVIEW OF SYSTEMS:   Constitutional: Denies fevers, chills or abnormal night sweats Eyes: Denies blurriness of vision, double vision or watery eyes Ears, nose, mouth, throat, and face: Denies mucositis or sore throat Respiratory: Denies cough, dyspnea or wheezes Cardiovascular: Denies palpitation, chest discomfort or  lower extremity swelling Gastrointestinal:  Denies nausea, heartburn or change in bowel habits Skin: Denies abnormal skin rashes Lymphatics: Denies new lymphadenopathy or easy  bruising Neurological:Denies numbness, tingling or new weaknesses.  She does report some cramps around her knees Behavioral/Psych: Mood is stable, no new changes  All other systems were reviewed with the patient and are negative.  MEDICAL HISTORY:  Past Medical History:  Diagnosis Date  . Acute on chronic systolic (congestive) heart failure ([place]) 04/29/2017  . Acute pain of right shoulder 06/16/2017  . AICD (automatic cardioverter/defibrillator) present   . Anxiety   . Arthritis    right shoulder   . Asthma   . CHF (congestive heart failure) ([place])   . Chronic combined systolic and diastolic heart failure ([place]) 03/05/2014  . Closed low lateral malleolus fracture 10/23/2013  . Cystitis 10/21/2017  . Depression   . Depression with anxiety 01/20/2013  . Diabetes mellitus without complication ([place])   . Diverticulosis   . Dyspnea    comes and goes intermittently mostly with exertion   . Essential hypertension    Prev followed by [facility]/ Cardiology   . Fibroid    age 8  . Gallstones   . Generalized abdominal cramping   . History of cardiomyopathy   . Hypertension   . IBS (irritable bowel syndrome)   . 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 Insomnia 05/07/2017  . Labral tear of shoulder 04/04/2015    Injected 04/04/2015 Injected 12/03/2015   . Migraine    "monthly" (08/03/2016)  . Myofascial pain 06/16/2017  . NICM (nonischemic cardiomyopathy) ([place]) 08/03/2016  . Nonallopathic lesion of lumbosacral region 11/16/2016  . Nonallopathic lesion of sacral region 11/16/2016  . Nonallopathic lesion of thoracic region 08/20/2014  . Nonspecific chest pain 04/28/2017  . OSA (obstructive sleep apnea) 01/02/2013   NPSG 2009:  AHI 9/hr. CPAP intolerance >> "smothering" Good tolerance of auto device (optimal pressure 12-13 on download).  - referred to Dr Gwenette Greet    . OSA on CPAP   . Ovarian cyst    1999; surgically removed  . Patellofemoral syndrome of both knees  10/16/2016  . 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 PVC (premature ventricular contraction) 06/23/2016  . Seizures ([place])    "as a child" (08/03/2016)  . Termination of pregnancy    due to cardiac risk    SURGICAL HISTORY: Past Surgical History:  Procedure Laterality Date  . CARDIAC CATHETERIZATION N/A 11/11/2015   Procedure: Right/Left Heart Cath and Coronary Angiography;  Surgeon: Troy Sine, MD;  Location: [place];  Service: Cardiovascular;  Laterality: N/A;  . CARDIAC CATHETERIZATION  ~ 2015  . CARDIAC DEFIBRILLATOR PLACEMENT  08/03/2016  . CESAREAN SECTION  1999  . COLONOSCOPY WITH PROPOFOL N/A 04/21/2016   Procedure: COLONOSCOPY WITH PROPOFOL;  Surgeon: Jerene Bears, MD;  Location: [facility];  Service: Gastroenterology;  Laterality: N/A;  . ESOPHAGOGASTRODUODENOSCOPY (EGD) WITH PROPOFOL N/A 04/21/2016   Procedure: ESOPHAGOGASTRODUODENOSCOPY (EGD) WITH PROPOFOL;  Surgeon: Jerene Bears, MD;  Location: [facility];  Service: Gastroenterology;  Laterality: N/A;  . FOOT FRACTURE SURGERY Right ~ 2003  . FRACTURE SURGERY    . ICD IMPLANT N/A 08/03/2016   Procedure: ICD Implant;  Surgeon: Deboraha Sprang, MD;  Location: [place];  Service: Cardiovascular;  Laterality: N/A;  . LAPAROSCOPIC CHOLECYSTECTOMY  12/2006  . LAPAROSCOPIC GASTRIC SLEEVE RESECTION    . LAPAROSCOPY ABDOMEN DIAGNOSTIC  2008   "  cut bile duct w/gallbladder OR; had to go in later & fix leak; hospitalized for 2 months"  . LEFT HEART CATHETERIZATION WITH CORONARY ANGIOGRAM N/A 02/26/2014   Procedure: LEFT HEART CATHETERIZATION WITH CORONARY ANGIOGRAM;  Surgeon: Jettie Booze, MD;  Location: [facility] CATH LAB;  Service: Cardiovascular;  Laterality: N/A;  . OVARIAN CYST REMOVAL Right 1999  . PVC ABLATION N/A 12/01/2017   Procedure: PVC ABLATION;  Surgeon: Evans Lance, MD;  Location: [place] [facility];  Service: Cardiovascular;  Laterality: N/A;  . RIGHT HEART CATH N/A  08/05/2017   Procedure: RIGHT HEART CATH;  Surgeon: Jolaine Artist, MD;  Location: [place] [facility];  Service: Cardiovascular;  Laterality: N/A;  . RIGHT HEART CATH N/A 11/16/2019   Procedure: RIGHT HEART CATH;  Surgeon: Jolaine Artist, MD;  Location: [place] [facility];  Service: Cardiovascular;  Laterality: N/A;  . TUBAL LIGATION  1999    SOCIAL HISTORY: Social History   Socioeconomic History  . Marital status: Married    Spouse name: Not on file  . Number of children: 1  . Years of education: Not on file  . Highest education level: Not on file  Occupational History  . Occupation: stay at home mom  Tobacco Use  . Smoking status: Never Smoker  . Smokeless tobacco: Never Used  Vaping Use  . Vaping Use: Never used  Substance and Sexual Activity  . Alcohol use: No  . Drug use: No  . Sexual activity: Yes  Other Topics Concern  . Not on file  Social History Narrative  . Not on file   Social Determinants of Health   Financial Resource Strain: Not on file  Food Insecurity: No Food Insecurity  . Worried About Charity fundraiser in the Last Year: Never true  . Ran Out of Food in the Last Year: Never true  Transportation Needs: No Transportation Needs  . Lack of Transportation (Medical): No  . Lack of Transportation (Non-Medical): No  Physical Activity: Insufficiently Active  . Days of Exercise per Week: 2 days  . Minutes of Exercise per Session: 40 min  Stress: Not on file  Social Connections: Socially Isolated  . Frequency of Communication with Friends and Family: Twice a week  . Frequency of Social Gatherings with Friends and Family: Never  . Attends Religious Services: Never  . Active Member of Clubs or Organizations: No  . Attends Archivist Meetings: Never  . Marital Status: Married  Human resources officer Violence: Not on file    FAMILY HISTORY: Family History  Problem Relation Age of Onset  . Emphysema Maternal Grandmother        smoked  .  Heart disease Maternal Grandmother 52       MI  . Rheum arthritis Mother   . Allergies Daughter   . Colon cancer Neg Hx     ALLERGIES:  is allergic to vancomycin, aspirin, contrast media [iodinated diagnostic agents], avocado, ciprofloxacin, farxiga [dapagliflozin], and sulfa antibiotics.  MEDICATIONS:  Current Outpatient Medications  Medication Sig Dispense Refill  . albuterol (PROAIR HFA) 108 (90 Base) MCG/ACT inhaler Inhale 1-2 puffs into the lungs every 6 (six) hours as needed for wheezing or shortness of breath. 18 g 5  . amiodarone (PACERONE) 200 MG tablet Take 1 tablet (200 mg total) by mouth daily. 30 tablet 2  . budesonide-formoterol (SYMBICORT) 160-4.5 MCG/ACT inhaler Inhale 2 puffs into the lungs 2 (two) times daily as needed. 30.6 each 3  . butalbital-acetaminophen-caffeine (  FIORICET) 50-325-40 MG tablet Take 1-2 tablets by mouth every 6 (six) hours as needed for headache.    . colestipol (COLESTID) 1 g tablet Take 2 g by mouth daily as needed (IBS).    Marland Kitchen diclofenac (VOLTAREN) 75 MG EC tablet Take 1 tablet (75 mg total) by mouth 2 (two) times daily. 30 tablet 0  . diclofenac Sodium (VOLTAREN) 1 % GEL Apply 4 g topically 4 (four) times daily. 300 g 6  . dicyclomine (BENTYL) 20 MG tablet Take 1 tablet (20 mg total) by mouth daily as needed for spasms. 90 tablet 3  . escitalopram (LEXAPRO) 10 MG tablet Take 1 tablet (10 mg total) by mouth daily. 90 tablet 3  . fluticasone (FLONASE) 50 MCG/ACT nasal spray Place 2 sprays into both nostrils daily as needed for allergies. 48 g 0  . gabapentin (NEURONTIN) 300 MG capsule Take 1 capsule (300 mg total) by mouth at bedtime as needed (back spasms). 90 capsule 3  . Ibuprofen-Famotidine (DUEXIS) 800-26.6 MG TABS Take 1 tablet by mouth 2 (two) times daily as needed. 30 tablet 3  . ivabradine (CORLANOR) 7.5 MG TABS tablet Take 1 tablet (7.5 mg total) by mouth 2 (two) times daily with a meal. 60 tablet 2  . lidocaine (LIDODERM) 5 % Place 1 patch  onto the skin daily. Remove & Discard patch within 12 hours or as directed by MD 90 patch 3  . Magnesium Oxide 200 MG TABS Take 1 tablet (200 mg total) by mouth daily. 90 tablet 3  . montelukast (SINGULAIR) 10 MG tablet Take 1 tablet (10 mg total) by mouth at bedtime. 30 tablet 3  . Multiple Vitamins-Minerals (MULTIVITAMIN GUMMIES WOMENS PO) Take 2 tablets by mouth at bedtime.     . nitroGLYCERIN (NITROSTAT) 0.4 MG SL tablet Place 1 tablet (0.4 mg total) under the tongue every 5 (five) minutes as needed for chest pain. 75 tablet 0  . omeprazole (PRILOSEC) 20 MG capsule Take 1 capsule (20 mg total) by mouth daily as needed (for reflux symptoms). 30 capsule 0  . sacubitril-valsartan (ENTRESTO) 97-103 MG Take 1 tablet by mouth 2 (two) times daily. 60 tablet 2  . tiZANidine (ZANAFLEX) 4 MG tablet Take 4 mg by mouth every 8 (eight) hours as needed for muscle spasms.     Marland Kitchen Ubrogepant (UBRELVY) 50 MG TABS Take 50 mg by mouth every 2 (two) hours as needed (migraine). 30 tablet 2  . Vitamin D, Ergocalciferol, (DRISDOL) 1.25 MG (50000 UNIT) CAPS capsule Take 1 capsule (50,000 Units total) by mouth every 7 (seven) days. 12 capsule 0  . carvedilol (COREG) 12.5 MG tablet Take 1.5 tablets (18.75 mg total) by mouth 2 (two) times daily. 90 tablet 2  . ipratropium-albuterol (DUONEB) 0.5-2.5 (3) MG/3ML SOLN Take 3 mLs by nebulization every 6 (six) hours as needed. 360 mL 0  . torsemide (DEMADEX) 20 MG tablet Take 1 tablet (20 mg total) by mouth daily. 30 tablet 2   No current facility-administered medications for this visit.     PHYSICAL EXAMINATION: ECOG PERFORMANCE STATUS: 1 - Symptomatic but completely ambulatory  Vitals:   08/06/20 1413  BP: 124/70  Pulse: (!) 109  Resp: 20  Temp: (!) 97 F (36.1 C)  SpO2: 99%   Filed Weights   08/06/20 1413  Weight: 225 lb 11.2 oz (102.4 kg)    GENERAL:alert, no distress and comfortable SKIN: skin color, texture, turgor are normal, no rashes or significant  lesions EYES: normal sclera clear OROPHARYNX:no exudate,  no erythema and lips, buccal mucosa, and tongue normal  NECK: supple, thyroid normal size, non-tender, without nodularity LYMPH:  no palpable lymphadenopathy in the cervical, axillary or inguinal LUNGS: clear to auscultation and percussion with normal breathing effort HEART: regular rate & rhythm and no murmurs and no lower extremity edema ABDOMEN:abdomen soft, non-tender and normal bowel sounds Musculoskeletal:no cyanosis of digits and no clubbing  PSYCH: alert & oriented x 3 with fluent speech NEURO: no focal motor/sensory deficits  LABORATORY DATA:  I have reviewed the data as listed Lab Results  Component Value Date   WBC 5.3 07/29/2020   HGB 8.9 (L) 07/29/2020   HCT 29.3 (L) 07/29/2020   MCV 66.0 (L) 07/29/2020   PLT 272.0 07/29/2020     Chemistry      Component Value Date/Time   NA 142 07/29/2020 1425   NA 142 09/07/2019 1159   K 3.5 07/29/2020 1425   CL 106 07/29/2020 1425   CO2 30 07/29/2020 1425   BUN 10 07/29/2020 1425   BUN 9 09/07/2019 1159   CREATININE 0.72 07/29/2020 1425   CREATININE 0.90 03/26/2016 1536      Component Value Date/Time   CALCIUM 8.8 07/29/2020 1425   ALKPHOS 86 07/29/2020 1425   AST 19 07/29/2020 1425   ALT 12 07/29/2020 1425   BILITOT 0.3 07/29/2020 1425   BILITOT 0.4 09/07/2019 1159       RADIOGRAPHIC STUDIES: I have personally reviewed the radiological images as listed and agreed with the findings in the report. No results found.  All questions were answered. The patient knows to call the clinic with any problems, questions or concerns. I spent 45 minutes in the care of this patient including H and P, review of records, counseling and coordination of care.     Benay Pike, MD 08/06/2020 2:46 PM

## 2020-08-07 ENCOUNTER — Other Ambulatory Visit: Payer: Self-pay | Admitting: Hematology and Oncology

## 2020-08-07 ENCOUNTER — Telehealth: Payer: Self-pay

## 2020-08-07 ENCOUNTER — Telehealth: Payer: Self-pay | Admitting: Hematology and Oncology

## 2020-08-07 LAB — FOLATE RBC
Folate, Hemolysate: 259 ng/mL
Folate, RBC: 794 ng/mL (ref >498)
Hematocrit: 32.6 % — ABNORMAL LOW (ref 34.0–46.6)

## 2020-08-07 LAB — FERRITIN: Ferritin: 4 ng/mL — ABNORMAL LOW (ref 11–307)

## 2020-08-07 MED ORDER — CYANOCOBALAMIN 1000 MCG/ML IJ SOLN: 1000.0000 ug | INTRAMUSCULAR | 1 refills | Status: DC

## 2020-08-07 NOTE — Telephone Encounter (Signed)
-----   Message from Benay Pike, MD sent at 08/07/2020 11:50 AM EDT ----- Her iron numbers are very low, so like we discussed we will proceed with IV iron She is also very low on B12, would recommend 1000 mcg IM daily for 7 days followed by 1000 mcg weekly for 4 weeks then 1000 mcg once a month injections. Could you help me with B12 injection prescriptions and may be teach her how to do it.  Thanks,

## 2020-08-07 NOTE — Telephone Encounter (Signed)
Called patient and made her aware of Dr. Rob Hickman message below. Patient already scheduled for upcoming venofer infusions. Patient made aware of additional B12 injections and that she would be getting scheduled for those appointments. Patient agreeable to the treatment plan.

## 2020-08-07 NOTE — Telephone Encounter (Signed)
Scheduled appointments per 3/15 los. Spoke to patient who is aware of appointments dates and times.

## 2020-08-07 NOTE — Telephone Encounter (Signed)
Made patient aware that Dr. Chryl Heck sent prescription for IM B12 injections to her pharmacy. Verified if patient was comfortable with performing injections. Patient stated that she or her husband would be able to perform the injections. Patient shared concern with insurance coverage and stated that she did not mind coming into the infusion center to receive the injections, if insurance would cover. Dr. Chryl Heck made aware.

## 2020-08-09 ENCOUNTER — Telehealth (HOSPITAL_COMMUNITY): Payer: Self-pay

## 2020-08-09 NOTE — Telephone Encounter (Signed)
Late Entry: Received a fax requesting medical records from [facility] Records were successfully faxed to: [phone] on 08/07/2020 ,which was the number provided.. Medical request form will be scanned into patients chart.

## 2020-08-20 ENCOUNTER — Ambulatory Visit: Payer: HMO | Attending: Family Medicine | Admitting: Physical Therapy

## 2020-08-21 ENCOUNTER — Encounter (HOSPITAL_COMMUNITY): Payer: Self-pay

## 2020-08-23 ENCOUNTER — Inpatient Hospital Stay: Payer: HMO | Attending: Hematology and Oncology

## 2020-08-23 ENCOUNTER — Other Ambulatory Visit: Payer: Self-pay

## 2020-08-23 VITALS — BP 140/80 | HR 51 | Temp 98.3°F | Resp 20

## 2020-08-23 DIAGNOSIS — E119 Type 2 diabetes mellitus without complications: Secondary | ICD-10-CM | POA: Diagnosis present

## 2020-08-23 DIAGNOSIS — Z8249 Family history of ischemic heart disease and other diseases of the circulatory system: Secondary | ICD-10-CM | POA: Insufficient documentation

## 2020-08-23 DIAGNOSIS — Z9049 Acquired absence of other specified parts of digestive tract: Secondary | ICD-10-CM | POA: Diagnosis not present

## 2020-08-23 DIAGNOSIS — Z91018 Allergy to other foods: Secondary | ICD-10-CM

## 2020-08-23 DIAGNOSIS — Z886 Allergy status to analgesic agent status: Secondary | ICD-10-CM

## 2020-08-23 DIAGNOSIS — E1100 Type 2 diabetes mellitus with hyperosmolarity without nonketotic hyperglycemic-hyperosmolar coma (NKHHC): Secondary | ICD-10-CM | POA: Diagnosis not present

## 2020-08-23 DIAGNOSIS — Z6838 Body mass index (BMI) 38.0-38.9, adult: Secondary | ICD-10-CM

## 2020-08-23 DIAGNOSIS — E669 Obesity, unspecified: Secondary | ICD-10-CM | POA: Diagnosis present

## 2020-08-23 DIAGNOSIS — I493 Ventricular premature depolarization: Secondary | ICD-10-CM | POA: Diagnosis present

## 2020-08-23 DIAGNOSIS — I5022 Chronic systolic (congestive) heart failure: Secondary | ICD-10-CM | POA: Diagnosis present

## 2020-08-23 DIAGNOSIS — I428 Other cardiomyopathies: Secondary | ICD-10-CM | POA: Diagnosis present

## 2020-08-23 DIAGNOSIS — Z20822 Contact with and (suspected) exposure to covid-19: Secondary | ICD-10-CM | POA: Diagnosis present

## 2020-08-23 DIAGNOSIS — Z79899 Other long term (current) drug therapy: Secondary | ICD-10-CM | POA: Diagnosis not present

## 2020-08-23 DIAGNOSIS — Z7951 Long term (current) use of inhaled steroids: Secondary | ICD-10-CM

## 2020-08-23 DIAGNOSIS — I110 Hypertensive heart disease with heart failure: Secondary | ICD-10-CM | POA: Diagnosis present

## 2020-08-23 DIAGNOSIS — D509 Iron deficiency anemia, unspecified: Secondary | ICD-10-CM | POA: Diagnosis present

## 2020-08-23 DIAGNOSIS — J4520 Mild intermittent asthma, uncomplicated: Secondary | ICD-10-CM | POA: Diagnosis not present

## 2020-08-23 DIAGNOSIS — Z9884 Bariatric surgery status: Secondary | ICD-10-CM | POA: Insufficient documentation

## 2020-08-23 DIAGNOSIS — Z8261 Family history of arthritis: Secondary | ICD-10-CM | POA: Diagnosis not present

## 2020-08-23 DIAGNOSIS — I5023 Acute on chronic systolic (congestive) heart failure: Secondary | ICD-10-CM | POA: Diagnosis not present

## 2020-08-23 DIAGNOSIS — F418 Other specified anxiety disorders: Secondary | ICD-10-CM | POA: Diagnosis present

## 2020-08-23 DIAGNOSIS — Z836 Family history of other diseases of the respiratory system: Secondary | ICD-10-CM | POA: Insufficient documentation

## 2020-08-23 DIAGNOSIS — I10 Essential (primary) hypertension: Secondary | ICD-10-CM | POA: Diagnosis not present

## 2020-08-23 DIAGNOSIS — R0789 Other chest pain: Secondary | ICD-10-CM | POA: Diagnosis present

## 2020-08-23 DIAGNOSIS — G4733 Obstructive sleep apnea (adult) (pediatric): Secondary | ICD-10-CM | POA: Diagnosis not present

## 2020-08-23 DIAGNOSIS — Z881 Allergy status to other antibiotic agents status: Secondary | ICD-10-CM

## 2020-08-23 DIAGNOSIS — I959 Hypotension, unspecified: Secondary | ICD-10-CM | POA: Diagnosis not present

## 2020-08-23 DIAGNOSIS — I5021 Acute systolic (congestive) heart failure: Secondary | ICD-10-CM | POA: Diagnosis not present

## 2020-08-23 DIAGNOSIS — Z95810 Presence of automatic (implantable) cardiac defibrillator: Secondary | ICD-10-CM

## 2020-08-23 DIAGNOSIS — O94 Sequelae of complication of pregnancy, childbirth, and the puerperium: Secondary | ICD-10-CM | POA: Diagnosis not present

## 2020-08-23 DIAGNOSIS — R079 Chest pain, unspecified: Secondary | ICD-10-CM | POA: Diagnosis not present

## 2020-08-23 DIAGNOSIS — Z825 Family history of asthma and other chronic lower respiratory diseases: Secondary | ICD-10-CM

## 2020-08-23 DIAGNOSIS — O903 Peripartum cardiomyopathy: Secondary | ICD-10-CM | POA: Diagnosis not present

## 2020-08-23 DIAGNOSIS — M19011 Primary osteoarthritis, right shoulder: Secondary | ICD-10-CM | POA: Diagnosis present

## 2020-08-23 DIAGNOSIS — Z91041 Radiographic dye allergy status: Secondary | ICD-10-CM

## 2020-08-23 DIAGNOSIS — K909 Intestinal malabsorption, unspecified: Secondary | ICD-10-CM | POA: Insufficient documentation

## 2020-08-23 DIAGNOSIS — K589 Irritable bowel syndrome without diarrhea: Secondary | ICD-10-CM | POA: Diagnosis present

## 2020-08-23 DIAGNOSIS — J449 Chronic obstructive pulmonary disease, unspecified: Secondary | ICD-10-CM | POA: Diagnosis present

## 2020-08-23 DIAGNOSIS — Z28310 Unvaccinated for covid-19: Secondary | ICD-10-CM

## 2020-08-23 DIAGNOSIS — I517 Cardiomegaly: Secondary | ICD-10-CM | POA: Diagnosis not present

## 2020-08-23 DIAGNOSIS — Z882 Allergy status to sulfonamides status: Secondary | ICD-10-CM

## 2020-08-23 DIAGNOSIS — N920 Excessive and frequent menstruation with regular cycle: Secondary | ICD-10-CM | POA: Insufficient documentation

## 2020-08-23 DIAGNOSIS — D500 Iron deficiency anemia secondary to blood loss (chronic): Secondary | ICD-10-CM | POA: Insufficient documentation

## 2020-08-23 MED ORDER — SODIUM CHLORIDE 0.9 % IV SOLN
200.0000 mg | Freq: Once | INTRAVENOUS | Status: AC
  Administered 2020-08-23: 200 mg via INTRAVENOUS
  Filled 2020-08-23: qty 200

## 2020-08-23 MED ORDER — SODIUM CHLORIDE 0.9 % IV SOLN
Freq: Once | INTRAVENOUS | Status: AC
  Filled 2020-08-23: qty 250

## 2020-08-23 NOTE — Patient Instructions (Signed)

## 2020-08-25 ENCOUNTER — Other Ambulatory Visit: Payer: Self-pay

## 2020-08-25 ENCOUNTER — Inpatient Hospital Stay (HOSPITAL_COMMUNITY)
Admission: EM | Admit: 2020-08-25 | Discharge: 2020-08-27 | DRG: 776 | Disposition: A | Discharge status: Home / Self Care | Payer: HMO | Attending: Internal Medicine | Admitting: Internal Medicine

## 2020-08-25 ENCOUNTER — Encounter: Payer: Self-pay | Admitting: Internal Medicine

## 2020-08-25 ENCOUNTER — Emergency Department (HOSPITAL_COMMUNITY): Payer: HMO

## 2020-08-25 ENCOUNTER — Encounter (HOSPITAL_COMMUNITY): Payer: Self-pay | Admitting: Emergency Medicine

## 2020-08-25 DIAGNOSIS — I428 Other cardiomyopathies: Secondary | ICD-10-CM | POA: Diagnosis present

## 2020-08-25 DIAGNOSIS — Z95810 Presence of automatic (implantable) cardiac defibrillator: Secondary | ICD-10-CM

## 2020-08-25 DIAGNOSIS — E669 Obesity, unspecified: Secondary | ICD-10-CM | POA: Diagnosis present

## 2020-08-25 DIAGNOSIS — Z79899 Other long term (current) drug therapy: Secondary | ICD-10-CM

## 2020-08-25 DIAGNOSIS — R0789 Other chest pain: Secondary | ICD-10-CM | POA: Diagnosis present

## 2020-08-25 DIAGNOSIS — J4520 Mild intermittent asthma, uncomplicated: Secondary | ICD-10-CM

## 2020-08-25 DIAGNOSIS — O94 Sequelae of complication of pregnancy, childbirth, and the puerperium: Secondary | ICD-10-CM | POA: Diagnosis not present

## 2020-08-25 DIAGNOSIS — O903 Peripartum cardiomyopathy: Secondary | ICD-10-CM

## 2020-08-25 DIAGNOSIS — Z6838 Body mass index (BMI) 38.0-38.9, adult: Secondary | ICD-10-CM

## 2020-08-25 DIAGNOSIS — D509 Iron deficiency anemia, unspecified: Secondary | ICD-10-CM | POA: Diagnosis present

## 2020-08-25 DIAGNOSIS — Z8261 Family history of arthritis: Secondary | ICD-10-CM | POA: Diagnosis not present

## 2020-08-25 DIAGNOSIS — Z881 Allergy status to other antibiotic agents status: Secondary | ICD-10-CM

## 2020-08-25 DIAGNOSIS — R079 Chest pain, unspecified: Secondary | ICD-10-CM | POA: Diagnosis present

## 2020-08-25 DIAGNOSIS — K589 Irritable bowel syndrome without diarrhea: Secondary | ICD-10-CM | POA: Diagnosis present

## 2020-08-25 DIAGNOSIS — Z7951 Long term (current) use of inhaled steroids: Secondary | ICD-10-CM | POA: Diagnosis not present

## 2020-08-25 DIAGNOSIS — Z8249 Family history of ischemic heart disease and other diseases of the circulatory system: Secondary | ICD-10-CM | POA: Diagnosis not present

## 2020-08-25 DIAGNOSIS — I5023 Acute on chronic systolic (congestive) heart failure: Secondary | ICD-10-CM | POA: Diagnosis present

## 2020-08-25 DIAGNOSIS — Z20822 Contact with and (suspected) exposure to covid-19: Secondary | ICD-10-CM | POA: Diagnosis present

## 2020-08-25 DIAGNOSIS — F418 Other specified anxiety disorders: Secondary | ICD-10-CM | POA: Diagnosis present

## 2020-08-25 DIAGNOSIS — Z9884 Bariatric surgery status: Secondary | ICD-10-CM

## 2020-08-25 DIAGNOSIS — J449 Chronic obstructive pulmonary disease, unspecified: Secondary | ICD-10-CM | POA: Diagnosis present

## 2020-08-25 DIAGNOSIS — I493 Ventricular premature depolarization: Secondary | ICD-10-CM | POA: Diagnosis present

## 2020-08-25 DIAGNOSIS — Z825 Family history of asthma and other chronic lower respiratory diseases: Secondary | ICD-10-CM

## 2020-08-25 DIAGNOSIS — I5022 Chronic systolic (congestive) heart failure: Secondary | ICD-10-CM | POA: Diagnosis present

## 2020-08-25 DIAGNOSIS — G4733 Obstructive sleep apnea (adult) (pediatric): Secondary | ICD-10-CM | POA: Diagnosis present

## 2020-08-25 DIAGNOSIS — I10 Essential (primary) hypertension: Secondary | ICD-10-CM | POA: Diagnosis present

## 2020-08-25 DIAGNOSIS — I110 Hypertensive heart disease with heart failure: Secondary | ICD-10-CM | POA: Diagnosis present

## 2020-08-25 DIAGNOSIS — Z882 Allergy status to sulfonamides status: Secondary | ICD-10-CM

## 2020-08-25 DIAGNOSIS — M19011 Primary osteoarthritis, right shoulder: Secondary | ICD-10-CM | POA: Diagnosis present

## 2020-08-25 DIAGNOSIS — Z9049 Acquired absence of other specified parts of digestive tract: Secondary | ICD-10-CM

## 2020-08-25 DIAGNOSIS — Z91041 Radiographic dye allergy status: Secondary | ICD-10-CM

## 2020-08-25 DIAGNOSIS — Z886 Allergy status to analgesic agent status: Secondary | ICD-10-CM | POA: Diagnosis not present

## 2020-08-25 DIAGNOSIS — J45909 Unspecified asthma, uncomplicated: Secondary | ICD-10-CM | POA: Diagnosis present

## 2020-08-25 DIAGNOSIS — I5021 Acute systolic (congestive) heart failure: Secondary | ICD-10-CM | POA: Diagnosis not present

## 2020-08-25 DIAGNOSIS — E119 Type 2 diabetes mellitus without complications: Secondary | ICD-10-CM | POA: Diagnosis present

## 2020-08-25 DIAGNOSIS — Z91018 Allergy to other foods: Secondary | ICD-10-CM

## 2020-08-25 DIAGNOSIS — E1100 Type 2 diabetes mellitus with hyperosmolarity without nonketotic hyperglycemic-hyperosmolar coma (NKHHC): Secondary | ICD-10-CM | POA: Diagnosis not present

## 2020-08-25 DIAGNOSIS — Z28310 Unvaccinated for covid-19: Secondary | ICD-10-CM

## 2020-08-25 DIAGNOSIS — I959 Hypotension, unspecified: Secondary | ICD-10-CM | POA: Diagnosis not present

## 2020-08-25 LAB — BASIC METABOLIC PANEL
Anion gap: 5 (ref 5–15)
BUN: 8 mg/dL (ref 6–20)
CO2: 25 mmol/L (ref 22–32)
Calcium: 9 mg/dL (ref 8.9–10.3)
Chloride: 106 mmol/L (ref 98–111)
Creatinine, Ser: 0.69 mg/dL (ref 0.44–1.00)
GFR, Estimated: >60 mL/min (ref >60)
Glucose, Bld: 92 mg/dL (ref 70–99)
Potassium: 3.8 mmol/L (ref 3.5–5.1)
Sodium: 136 mmol/L (ref 135–145)

## 2020-08-25 LAB — CBC WITH DIFFERENTIAL/PLATELET
Abs Immature Granulocytes: 0.03 10*3/uL (ref 0.00–0.07)
Basophils Absolute: 0.1 10*3/uL (ref 0.0–0.1)
Basophils Relative: 1 %
Eosinophils Absolute: 0.1 10*3/uL (ref 0.0–0.5)
Eosinophils Relative: 1 %
HCT: 32.2 % — ABNORMAL LOW (ref 36.0–46.0)
Hemoglobin: 9.1 g/dL — ABNORMAL LOW (ref 12.0–15.0)
Immature Granulocytes: 0 %
Lymphocytes Relative: 35 %
Lymphs Abs: 2.7 10*3/uL (ref 0.7–4.0)
MCH: 20 pg — ABNORMAL LOW (ref 26.0–34.0)
MCHC: 28.3 g/dL — ABNORMAL LOW (ref 30.0–36.0)
MCV: 70.9 fL — ABNORMAL LOW (ref 80.0–100.0)
Monocytes Absolute: 0.7 10*3/uL (ref 0.1–1.0)
Monocytes Relative: 9 %
Neutro Abs: 4 10*3/uL (ref 1.7–7.7)
Neutrophils Relative %: 54 %
Platelets: 307 10*3/uL (ref 150–400)
RBC: 4.54 MIL/uL (ref 3.87–5.11)
RDW: 17.7 % — ABNORMAL HIGH (ref 11.5–15.5)
WBC: 7.6 10*3/uL (ref 4.0–10.5)
nRBC: 0 % (ref 0.0–0.2)

## 2020-08-25 LAB — MAGNESIUM: Magnesium: 2 mg/dL (ref 1.7–2.4)

## 2020-08-25 LAB — CBG MONITORING, ED: Glucose-Capillary: 74 mg/dL (ref 70–99)

## 2020-08-25 LAB — BRAIN NATRIURETIC PEPTIDE: B Natriuretic Peptide: 301.6 pg/mL — ABNORMAL HIGH (ref 0.0–100.0)

## 2020-08-25 LAB — TROPONIN I (HIGH SENSITIVITY)
Troponin I (High Sensitivity): 5 ng/L (ref <18)
Troponin I (High Sensitivity): 6 ng/L (ref <18)

## 2020-08-25 MED ORDER — AMIODARONE HCL IN DEXTROSE 360-4.14 MG/200ML-% IV SOLN
30.0000 mg/h | INTRAVENOUS | Status: DC
  Administered 2020-08-26: 30 mg/h via INTRAVENOUS
  Filled 2020-08-25 (×3): qty 200

## 2020-08-25 MED ORDER — FUROSEMIDE 10 MG/ML IJ SOLN: 40.0000 mg | Freq: Two times a day (BID) | INTRAMUSCULAR | Status: DC

## 2020-08-25 MED ORDER — SODIUM CHLORIDE 0.45 % IV SOLN: Freq: Once | INTRAVENOUS | Status: AC

## 2020-08-25 MED ORDER — INSULIN ASPART 100 UNIT/ML ~~LOC~~ SOLN
0.0000 [IU] | SUBCUTANEOUS | Status: DC
  Administered 2020-08-26: 1 [IU] via SUBCUTANEOUS

## 2020-08-25 MED ORDER — CARVEDILOL 3.125 MG PO TABS
18.7500 mg | ORAL_TABLET | Freq: Two times a day (BID) | ORAL | Status: DC
  Administered 2020-08-25: 18.75 mg via ORAL
  Filled 2020-08-25: qty 6

## 2020-08-25 MED ORDER — IVABRADINE HCL 7.5 MG PO TABS
7.5000 mg | ORAL_TABLET | Freq: Two times a day (BID) | ORAL | Status: DC
  Administered 2020-08-26 – 2020-08-27 (×2): 7.5 mg via ORAL
  Filled 2020-08-25 (×4): qty 1

## 2020-08-25 MED ORDER — FUROSEMIDE 10 MG/ML IJ SOLN
40.0000 mg | Freq: Once | INTRAMUSCULAR | Status: AC
  Administered 2020-08-25: 40 mg via INTRAVENOUS
  Filled 2020-08-25: qty 4

## 2020-08-25 MED ORDER — MOMETASONE FURO-FORMOTEROL FUM 200-5 MCG/ACT IN AERO
2.0000 | INHALATION_SPRAY | Freq: Two times a day (BID) | RESPIRATORY_TRACT | Status: DC
  Administered 2020-08-27: 2 via RESPIRATORY_TRACT
  Filled 2020-08-25 (×2): qty 8.8

## 2020-08-25 MED ORDER — AMIODARONE HCL 200 MG PO TABS
200.0000 mg | ORAL_TABLET | Freq: Every day | ORAL | Status: DC
  Administered 2020-08-25: 200 mg via ORAL
  Filled 2020-08-25: qty 1

## 2020-08-25 MED ORDER — AMIODARONE LOAD VIA INFUSION
150.0000 mg | Freq: Once | INTRAVENOUS | Status: AC
  Administered 2020-08-26: 150 mg via INTRAVENOUS
  Filled 2020-08-25: qty 83.34

## 2020-08-25 MED ORDER — ONDANSETRON HCL 4 MG/2ML IJ SOLN
4.0000 mg | Freq: Four times a day (QID) | INTRAMUSCULAR | Status: DC | PRN
  Filled 2020-08-25: qty 2

## 2020-08-25 MED ORDER — GABAPENTIN 300 MG PO CAPS: 300.0000 mg | ORAL_CAPSULE | Freq: Every evening | ORAL | Status: DC | PRN

## 2020-08-25 MED ORDER — ACETAMINOPHEN 325 MG PO TABS
650.0000 mg | ORAL_TABLET | ORAL | Status: DC | PRN
  Administered 2020-08-26 (×2): 650 mg via ORAL
  Filled 2020-08-25 (×2): qty 2

## 2020-08-25 MED ORDER — SODIUM CHLORIDE 0.9% FLUSH: 3.0000 mL | INTRAVENOUS | Status: DC | PRN

## 2020-08-25 MED ORDER — MONTELUKAST SODIUM 10 MG PO TABS
10.0000 mg | ORAL_TABLET | Freq: Every day | ORAL | Status: DC
  Administered 2020-08-25 – 2020-08-26 (×2): 10 mg via ORAL
  Filled 2020-08-25 (×2): qty 1

## 2020-08-25 MED ORDER — ENOXAPARIN SODIUM 40 MG/0.4ML ~~LOC~~ SOLN
40.0000 mg | SUBCUTANEOUS | Status: DC
  Administered 2020-08-25 – 2020-08-26 (×2): 40 mg via SUBCUTANEOUS
  Filled 2020-08-25 (×2): qty 0.4

## 2020-08-25 MED ORDER — SODIUM CHLORIDE 0.9 % IV SOLN: 250.0000 mL | INTRAVENOUS | Status: DC | PRN

## 2020-08-25 MED ORDER — AMIODARONE HCL IN DEXTROSE 360-4.14 MG/200ML-% IV SOLN
60.0000 mg/h | INTRAVENOUS | Status: DC
  Administered 2020-08-26 (×2): 60 mg/h via INTRAVENOUS

## 2020-08-25 MED ORDER — SODIUM CHLORIDE 0.9% FLUSH
3.0000 mL | Freq: Two times a day (BID) | INTRAVENOUS | Status: DC
  Administered 2020-08-25 – 2020-08-27 (×3): 3 mL via INTRAVENOUS

## 2020-08-25 MED ORDER — SACUBITRIL-VALSARTAN 97-103 MG PO TABS
1.0000 | ORAL_TABLET | Freq: Two times a day (BID) | ORAL | Status: DC
  Administered 2020-08-25: 1 via ORAL
  Filled 2020-08-25: qty 1

## 2020-08-25 MED ORDER — SODIUM CHLORIDE 0.9 % IV BOLUS
250.0000 mL | Freq: Once | INTRAVENOUS | Status: AC
  Administered 2020-08-25: 250 mL via INTRAVENOUS

## 2020-08-25 MED ORDER — AMIODARONE IV BOLUS ONLY 150 MG/100ML: 150.0000 mg | Freq: Once | INTRAVENOUS | Status: DC

## 2020-08-25 NOTE — ED Triage Notes (Addendum)
Pt reports non-productive cough, SOB, and pain to center of chest since yesterday.  Denies nausea and vomiting.  Also reports palpitations since yesterday.  Pt has CHF and ICD.

## 2020-08-25 NOTE — ED Notes (Signed)
Pt assisted to restroom.  

## 2020-08-25 NOTE — ED Provider Notes (Signed)
Patient placed in Quick Look pathway, seen and evaluated   Chief Complaint: Chest pain  HPI:   45 year old female with history of CHF due to postpartum cardiomyopathy with ICD in place, presents with constant central chest pain and shortness of breath since yesterday.  Pain worse with deep breath and certain movements.  No nausea or vomiting.  Also reports intermittent palpitations that been worse since yesterday.  ROS: + Chest pain, shortness of breath, palpitations  -Abdominal pain, syncope  Physical Exam:   Gen: No distress  Neuro: Awake and Alert  Skin: Warm    Focused Exam: Mildly tachycardic, regular, lungs clear to auscultation bilaterally, chest and abdomen nontender.  EKG noted to have new bigeminy.  Patient made priority to get back to her room given new EKG changes with cardiac history.  Patient mildly tachycardic but vitals otherwise stable  Initiation of care has begun. The patient has been counseled on the process, plan, and necessity for staying for the completion/evaluation, and the remainder of the medical screening examination MSE was initiated and I personally evaluated the patient and placed orders (if any) at  2:37 PM on August 25, 2020.  The patient appears stable so that the remainder of the MSE may be completed by another provider.   Jacqlyn Larsen, PA-C 08/25/20 1440    Arnaldo Natal, MD 08/25/20 [phone]

## 2020-08-25 NOTE — ED Provider Notes (Signed)
[facility] EMERGENCY DEPARTMENT Provider Note   CSN: [id number] Arrival date & time: 08/25/20  1400  History Chief Complaint  Patient presents with  . Chest Pain    [name] is a 45 y.o. female.  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  Symptoms started last night.  Patient states her symptoms of gotten worse over the course of the night and into today.  She reports dyspnea on exertion.  She reports feeling swollen.  She describes chest tightness and a squeezing pain under her left breast.  This does not radiate anywhere.  She denies any nausea or diaphoresis.  Denies any increased leg swelling.  Reports compliance with her home heart failure medications.  Recently diagnosed with anemia.  Denies any visual changes or dizziness.  Denies nausea vomiting or diarrhea.  She has had no abdominal pain.  No dysuria.  Patient has box for AICD at home reports no episodes of firing.    Past Medical History:  Diagnosis Date  . Acute on chronic systolic (congestive) heart failure ([place]) 04/29/2017  . Acute pain of right shoulder 06/16/2017  . AICD (automatic cardioverter/defibrillator) present   . Anxiety   . Arthritis    right shoulder   . Asthma   . CHF (congestive heart failure) ([place])   . Chronic combined systolic and diastolic heart failure ([place]) 03/05/2014  . Closed low lateral malleolus fracture 10/23/2013  . Cystitis 10/21/2017  . Depression   . Depression with anxiety 01/20/2013  . Diabetes mellitus without complication ([place])   . Diverticulosis   . Dyspnea    comes and goes intermittently mostly with exertion   . Essential hypertension    Prev followed by [facility]/ Cardiology   . Fibroid    age 39  . Gallstones   . Generalized abdominal cramping   . History of cardiomyopathy   . Hypertension   . IBS (irritable bowel syndrome)   . ICD  (implantable cardioverter-defibrillator), single, 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  . Insomnia 05/07/2017  . Labral tear of shoulder 04/04/2015    Injected 04/04/2015 Injected 12/03/2015   . Migraine    "monthly" (08/03/2016)  . Myofascial pain 06/16/2017  . NICM (nonischemic cardiomyopathy) ([place]) 08/03/2016  . Nonallopathic lesion of lumbosacral region 11/16/2016  . Nonallopathic lesion of sacral region 11/16/2016  . Nonallopathic lesion of thoracic region 08/20/2014  . Nonspecific chest pain 04/28/2017  . OSA (obstructive sleep apnea) 01/02/2013   NPSG 2009:  AHI 9/hr. CPAP intolerance >> "smothering" Good tolerance of auto device (optimal pressure 12-13 on download).  - referred to Dr Gwenette Greet    . OSA on CPAP   . Ovarian cyst    1999; surgically removed  . Patellofemoral syndrome of both knees 10/16/2016  . 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 PVC (premature ventricular contraction) 06/23/2016  . Seizures ([place])    "as a child" (08/03/2016)  . Termination of pregnancy    due to cardiac risk    Patient Active Problem List   Diagnosis Date Noted  . Iron deficiency anemia due to chronic blood loss 08/06/2020  . Greater trochanteric bursitis, left 07/01/2020  . V-tach ([place]) 11/12/2019  . Asthma with acute exacerbation 09/14/2019  . Left hip pain 03/21/2019  . Headache 10/26/2018  . Chronic right-sided low back pain without sciatica 06/24/2018  . Trigger point of shoulder region, right 06/13/2018  .  Patellar subluxation, left, initial encounter 04/25/2018  . Left flank pain 01/14/2018  . 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 Myofascial pain 06/16/2017  . Insomnia 05/07/2017  . History of cardiomyopathy   . Acute on chronic systolic (congestive) heart failure ([place]) 04/29/2017  . Nonspecific chest pain 04/28/2017  . 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 Patellofemoral syndrome of both knees 10/16/2016  . NICM (nonischemic  cardiomyopathy) ([place]) 08/03/2016  . PVC (premature ventricular contraction) 06/23/2016  . Labral tear of shoulder 04/04/2015  . Chronic combined systolic and diastolic heart failure ([place]) 03/05/2014  . IBS (irritable bowel syndrome)   . Depression with anxiety 01/20/2013  . OSA (obstructive sleep apnea) 01/02/2013  . 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 Essential hypertension     Past Surgical History:  Procedure Laterality Date  . CARDIAC CATHETERIZATION N/A 11/11/2015   Procedure: Right/Left Heart Cath and Coronary Angiography;  Surgeon: Troy Sine, MD;  Location: [facility] CV LAB;  Service: Cardiovascular;  Laterality: N/A;  . CARDIAC CATHETERIZATION  ~ 2015  . CARDIAC DEFIBRILLATOR PLACEMENT  08/03/2016  . CESAREAN SECTION  1999  . COLONOSCOPY WITH PROPOFOL N/A 04/21/2016   Procedure: COLONOSCOPY WITH PROPOFOL;  Surgeon: Jerene Bears, MD;  Location: [facility];  Service: Gastroenterology;  Laterality: N/A;  . ESOPHAGOGASTRODUODENOSCOPY (EGD) WITH PROPOFOL N/A 04/21/2016   Procedure: ESOPHAGOGASTRODUODENOSCOPY (EGD) WITH PROPOFOL;  Surgeon: Jerene Bears, MD;  Location: [facility];  Service: Gastroenterology;  Laterality: N/A;  . FOOT FRACTURE SURGERY Right ~ 2003  . FRACTURE SURGERY    . ICD IMPLANT N/A 08/03/2016   Procedure: ICD Implant;  Surgeon: Deboraha Sprang, MD;  Location: [facility] CV LAB;  Service: Cardiovascular;  Laterality: N/A;  . LAPAROSCOPIC CHOLECYSTECTOMY  12/2006  . LAPAROSCOPIC GASTRIC SLEEVE RESECTION    . LAPAROSCOPY ABDOMEN DIAGNOSTIC  2008   "cut bile duct w/gallbladder OR; had to go in later & fix leak; hospitalized for 2 months"  . LEFT HEART CATHETERIZATION WITH CORONARY ANGIOGRAM N/A 02/26/2014   Procedure: LEFT HEART CATHETERIZATION WITH CORONARY ANGIOGRAM;  Surgeon: Jettie Booze, MD;  Location: [facility] CATH LAB;  Service: Cardiovascular;  Laterality: N/A;  . OVARIAN CYST REMOVAL Right 1999  . PVC ABLATION N/A 12/01/2017   Procedure: PVC  ABLATION;  Surgeon: Evans Lance, MD;  Location: [facility] CV LAB;  Service: Cardiovascular;  Laterality: N/A;  . RIGHT HEART CATH N/A 08/05/2017   Procedure: RIGHT HEART CATH;  Surgeon: Jolaine Artist, MD;  Location: [place] CV LAB;  Service: Cardiovascular;  Laterality: N/A;  . RIGHT HEART CATH N/A 11/16/2019   Procedure: RIGHT HEART CATH;  Surgeon: Jolaine Artist, MD;  Location: [facility];  Service: Cardiovascular;  Laterality: N/A;  . TUBAL LIGATION  1999     OB History    Gravida  2   Para  1   Term      Preterm      AB  1   Living  1     SAB      IAB  1   Ectopic      Multiple      Live Births              Family History  Problem Relation Age of Onset  . Emphysema Maternal Grandmother        smoked  . Heart disease Maternal Grandmother 20       MI  . Rheum arthritis Mother   .  Allergies Daughter   . Colon cancer Neg Hx     Social History   Tobacco Use  . Smoking status: Never Smoker  . Smokeless tobacco: Never Used  Vaping Use  . Vaping Use: Never used  Substance Use Topics  . Alcohol use: No  . Drug use: No    Home Medications Prior to Admission medications   Medication Sig Start Date End Date Taking? Authorizing Provider  amiodarone (PACERONE) 200 MG tablet Take 1 tablet (200 mg total) by mouth daily. 05/30/20  Yes Bensimhon, Shaune Pascal, MD  budesonide-formoterol [facility]) 160-4.5 MCG/ACT inhaler Inhale 2 puffs into the lungs 2 (two) times daily as needed. 05/30/20  Yes Hoyt Koch, MD  carvedilol (COREG) 12.5 MG tablet Take 1.5 tablets (18.75 mg total) by mouth 2 (two) times daily. 05/30/20 06/29/20 Yes Bensimhon, Shaune Pascal, MD  cyanocobalamin (,VITAMIN B-12,) 1000 MCG/ML injection Inject 1 mL (1,000 mcg total) into the muscle as directed. Once daily for 7 days, then once weekly for 4 weeks followed by once monthly 08/07/20  Yes Iruku, Arletha Pili, MD  diclofenac (VOLTAREN) 75 MG EC tablet Take 1 tablet (75 mg total)  by mouth 2 (two) times daily. 06/18/20  Yes Hoyt Koch, MD  diclofenac Sodium (VOLTAREN) 1 % GEL Apply 4 g topically 4 (four) times daily. 05/30/20  Yes Hoyt Koch, MD  dicyclomine (BENTYL) 20 MG tablet Take 1 tablet (20 mg total) by mouth daily as needed for spasms. 05/30/20  Yes Hoyt Koch, MD  fluticasone [facility]) 50 MCG/ACT nasal spray Place 2 sprays into both nostrils daily as needed for allergies. 07/25/18  Yes Hoyt Koch, MD  gabapentin (NEURONTIN) 300 MG capsule Take 1 capsule (300 mg total) by mouth at bedtime as needed (back spasms). 05/30/20  Yes Hoyt Koch, MD  ipratropium-albuterol (DUONEB) 0.5-2.5 (3) MG/3ML SOLN Take 3 mLs by nebulization every 6 (six) hours as needed. 11/17/19 12/17/19 Yes Dahal, Marlowe Aschoff, MD  ivabradine (CORLANOR) 7.5 MG TABS tablet Take 1 tablet (7.5 mg total) by mouth 2 (two) times daily with a meal. 05/30/20  Yes Bensimhon, Shaune Pascal, MD  lidocaine (LIDODERM) 5 % Place 1 patch onto the skin daily. Remove & Discard patch within 12 hours or as directed by MD 05/30/20  Yes Hoyt Koch, MD  Magnesium Oxide 200 MG TABS Take 1 tablet (200 mg total) by mouth daily. 05/30/20  Yes Bensimhon, Shaune Pascal, MD  montelukast (SINGULAIR) 10 MG tablet Take 1 tablet (10 mg total) by mouth at bedtime. 09/13/19  Yes Fenton Foy, NP  Multiple Vitamins-Minerals (MULTIVITAMIN GUMMIES WOMENS PO) Take 2 tablets by mouth at bedtime.    Yes [provider]  nitroGLYCERIN (NITROSTAT) 0.4 MG SL tablet Place 1 tablet (0.4 mg total) under the tongue every 5 (five) minutes as needed for chest pain. 05/30/20  Yes Bensimhon, Shaune Pascal, MD  omeprazole (PRILOSEC) 20 MG capsule Take 1 capsule (20 mg total) by mouth daily as needed (for reflux symptoms). 06/18/20 09/13/22 Yes Hoyt Koch, MD  sacubitril-valsartan (ENTRESTO) 97-103 MG Take 1 tablet by mouth 2 (two) times daily. 05/30/20  Yes Bensimhon, Shaune Pascal, MD  tiZANidine (ZANAFLEX) 4 MG  tablet Take 4 mg by mouth every 8 (eight) hours as needed for muscle spasms.  12/02/18  Yes [provider]  torsemide (DEMADEX) 20 MG tablet Take 1 tablet (20 mg total) by mouth daily. 05/30/20 06/29/20 Yes Bensimhon, Shaune Pascal, MD  Vitamin D, Ergocalciferol, (DRISDOL) 1.25 MG (50000  UNIT) CAPS capsule Take 1 capsule (50,000 Units total) by mouth every 7 (seven) days. 05/30/20  Yes Hoyt Koch, MD  albuterol [facility] HFA) [phone] Base) MCG/ACT inhaler Inhale 1-2 puffs into the lungs every 6 (six) hours as needed for wheezing or shortness of breath. Patient not taking: Reported on 08/25/2020 08/17/19   Biagio Borg, MD  butalbital-acetaminophen-caffeine (FIORICET) [phone] MG tablet Take 1-2 tablets by mouth every 6 (six) hours as needed for headache. Patient not taking: Reported on 08/25/2020    [provider]  escitalopram (LEXAPRO) 10 MG tablet Take 1 tablet (10 mg total) by mouth daily. Patient not taking: Reported on 08/25/2020 11/30/19   Hoyt Koch, MD  Ibuprofen-Famotidine (DUEXIS) 800-26.6 MG TABS Take 1 tablet by mouth 2 (two) times daily as needed. Patient not taking: Reported on 08/25/2020 06/18/20   Hoyt Koch, MD  Ubrogepant (UBRELVY) 50 MG TABS Take 50 mg by mouth every 2 (two) hours as needed (migraine). Patient not taking: Reported on 08/25/2020 10/17/19   Hoyt Koch, MD    Allergies    Vancomycin, Aspirin, Contrast media [iodinated diagnostic agents], Avocado, Ciprofloxacin, Farxiga [dapagliflozin], and Sulfa antibiotics  Review of Systems   Review of Systems  Constitutional: Negative for chills and fever.  HENT: Negative for ear pain and sore throat.   Eyes: Negative for pain and visual disturbance.  Respiratory: Positive for shortness of breath. Negative for cough.   Cardiovascular: Positive for chest pain. Negative for palpitations.  Gastrointestinal: Negative for abdominal pain and vomiting.  Genitourinary: Negative for dysuria  and hematuria.  Musculoskeletal: Negative for back pain and neck pain.  Skin: Negative for color change and rash.  Neurological: Negative for seizures and syncope.  All other systems reviewed and are negative.   Physical Exam Updated Vital Signs BP (!) 130/112   Pulse (!) 107   Temp 97.9 F (36.6 C) (Oral)   Resp 17   Wt 104.9 kg   LMP 07/26/2020   SpO2 100%   BMI 38.48 kg/m   Physical Exam Vitals and nursing note reviewed.  Constitutional:      Appearance: She is well-developed.  HENT:     Head: Normocephalic and atraumatic.  Eyes:     Conjunctiva/sclera: Conjunctivae normal.  Cardiovascular:     Rate and Rhythm: Normal rate and regular rhythm.     Pulses:          Radial pulses are 2+ on the right side and 2+ on the left side.     Heart sounds: Normal heart sounds.  Pulmonary:     Effort: Pulmonary effort is normal.     Breath sounds: Normal breath sounds. No wheezing or rhonchi.  Abdominal:     Palpations: Abdomen is soft.     Tenderness: There is no abdominal tenderness.  Musculoskeletal:     Cervical back: Neck supple.     Right lower leg: No tenderness. No edema.     Left lower leg: No tenderness. No edema.  Skin:    General: Skin is warm and dry.  Neurological:     General: No focal deficit present.     Mental Status: She is alert and oriented to person, place, and time.     ED Results / Procedures / Treatments   Labs (all labs ordered are listed, but only abnormal results are displayed) Labs Reviewed  BRAIN NATRIURETIC PEPTIDE - Abnormal; Notable for the following components:      Result Value  B Natriuretic Peptide 301.6 (*)    All other components within normal limits  CBC WITH DIFFERENTIAL/PLATELET - Abnormal; Notable for the following components:   Hemoglobin 9.1 (*)    HCT 32.2 (*)    MCV 70.9 (*)    MCH 20.0 (*)    MCHC 28.3 (*)    RDW 17.7 (*)    All other components within normal limits  SARS CORONAVIRUS 2 (TAT 6-24 HRS)  BASIC  METABOLIC PANEL  MAGNESIUM  TSH  HEMOGLOBIN A1C  MAGNESIUM  COMPREHENSIVE METABOLIC PANEL  CBC WITH DIFFERENTIAL/PLATELET  TROPONIN I (HIGH SENSITIVITY)  TROPONIN I (HIGH SENSITIVITY)   EKG EKG Interpretation  Date/Time:  Sunday August 25 2020 14:09:02 EDT Ventricular Rate:  101 PR Interval:  172 QRS Duration: 86 QT Interval:  370 QTC Calculation: 479 R Axis:   -68 Text Interpretation: Sinus tachycardia with frequent Premature ventricular complexes in a pattern of bigeminy Left anterior fascicular block Septal infarct , age undetermined Abnormal ECG Confirmed by Curatolo, Adam ([id number]) on 08/25/2020 2:32:09 PM  Radiology DG Chest Portable 1 View  Result Date: 08/25/2020 CLINICAL DATA:  Chest pain EXAM: PORTABLE CHEST 1 VIEW COMPARISON:  November 27, 2019. FINDINGS: Enlarged cardiac silhouette. Left subclavian approach single lead cardiac rhythm maintenance device in similar position. No consolidation. No visible pleural effusions or pneumothorax. No evidence of acute osseous abnormality. IMPRESSION: 1. No acute cardiopulmonary disease. 2. Cardiomegaly. Electronically Signed   By: Frederick S Jones MD   On: 08/25/2020 15:46    Procedures Procedures   Medications Ordered in ED Medications  amiodarone (PACERONE) tablet 200 mg (200 mg Oral Given 08/25/20 2007)  mometasone-formoterol (DULERA) 200-5 MCG/ACT inhaler 2 puff (has no administration in time range)  carvedilol (COREG) tablet 18.75 mg (18.75 mg Oral Given 08/25/20 2053)  gabapentin (NEURONTIN) capsule 300 mg (has no administration in time range)  ivabradine (CORLANOR) tablet 7.5 mg (has no administration in time range)  montelukast (SINGULAIR) tablet 10 mg (has no administration in time range)  sacubitril-valsartan (ENTRESTO) 97-103 mg per tablet (has no administration in time range)  sodium chloride flush (NS) 0.9 % injection 3 mL (has no administration in time range)  sodium chloride flush (NS) 0.9 % injection 3 mL (has no  administration in time range)  0.9 %  sodium chloride infusion (has no administration in time range)  furosemide (LASIX) injection 40 mg (has no administration in time range)  acetaminophen (TYLENOL) tablet 650 mg (has no administration in time range)  ondansetron (ZOFRAN) injection 4 mg (has no administration in time range)  insulin aspart (novoLOG) injection 0-9 Units (has no administration in time range)  enoxaparin (LOVENOX) injection 40 mg (has no administration in time range)  furosemide (LASIX) injection 40 mg (40 mg Intravenous Given 08/25/20 1726)    ED Course  I have reviewed the triage vital signs and the nursing notes.  Pertinent labs & imaging results that were available during my care of the patient were reviewed by me and considered in my medical decision making (see chart for details).    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  She is currently well-appearing.  Blood pressure 109/67.  Afebrile.  Heart rate in the 90s but saturating 100% on room air.  Respiratory  rate 20.  Not in any respiratory distress while resting in bed.  I have reviewed her EKG.  Patient in a bigeminy which is not been previously demonstrated on other EKGs.  No ST elevations or depressions.  T wave inversion in aVL, but nothing notable in other leads.  Previous EKGs with premature ventricular contractions without bigeminy.  Concern for heart failure exacerbation from initial evaluation.  We will do work-up for chest pain including troponin, BNP, chest x-ray.  Troponin normal x2.  BNP elevated into the 300s.  Chest x-ray without abnormalities with the lungs, cardiomegaly present.  Pacemaker interrogated.  Evidence for increased heart failure risk score.  Showing signs of tachypnea and distress while patient  has been asleep at night for the past few days.  Patient with tachypnea in the room on reevaluation. Lasix 40 mg ordered.  Consulted cardiology because of patient's cardiac history.  They recommend hospitalist admission if true heart failure exacerbation for IV diuresis. No additional recommendations, will not admit to cardiology service.   Discussed patient with hospitalist. Will admit to their service.  Final Clinical Impression(s) / ED Diagnoses Final diagnoses:  Acute on chronic systolic heart failure [facility])    Rx / DC Orders ED Discharge Orders    None       Treyana Sturgell, [place], MD 08/25/20 2219    Gareth Morgan, MD 08/26/20 0009

## 2020-08-25 NOTE — Consult Note (Incomplete)
Cardiology Consultation:   Patient ID: [name] MRN: [id number]; DOB: [birth year]  Admit date: 08/25/2020 Date of Consult: 08/25/2020  PCP:  Hoyt Koch, MD   [facility]  Cardiologist:  Sinclair Grooms, MD  Advanced Practice Provider:  No care team member to display Electrophysiologist:  Cristopher Peru, MD        Patient Profile:   [name] is a 45 y.o. 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 Roel Cluck ([facility]).  History of Present Illness:   Ms. Fowles has a h/o peripartum CM w/ onset in 1999, most recent LVEF<20% in 2021, normal cors on LHC in 2017, h/o frequent PVCs and VT s/p ICD (on chronic amiodarone as per EP; s/p unsuccessful PVC ablation in 2019) follows chronically w/ HF clinic and presents to ED today c/o worsening chest tightness and DOE over the past 2 days. She describes the discomfort as a sharp or squeezing pain under her L breast w/o associated nausea, diaphoresis, or palpitations. This discomfort occurs in association with PVCs; she states she has noticed increasing palpitations that seem to bring on the pain over the past couple days. She does not think she is retaining fluid; she states she can normally tell when her volume status is off, and she has not noticed increased fluid retention over this period. She thinks the issue is that PVCs are occurring much more frequently than in the past, and that they are making her very uncomfortable. She has had frequent PVC on telemetry in the ED, and says her sx do seem to correspond to the ventricular ectopy. Her VE burden in the past has been as high as 17% (noted on zio in 2020 per HF clinic note). She was admitted in July 2021 w/ similar sx, and frequent PVCs. Coreg was increased to 18.75 bid at that time.  It appears pt's device notified HF clinic on 06-19-20 that volume  status was tenuous; pt was instructed to double torsemide dose x 3 days and take metolazone 2.5mg  daily for 2 days. Subsequent Heart[facility] HF index 0 recorded on 07-22-20. In ED today, CXR showed no acute cardiopulmonary disease. BNP 301. BUN 8, Cr 0.69, K 3.8. HS trop negative x 2 sets. She was initially admitted to hospital medicine, and both ED and hospitalist assumed pt was likely volume overloaded; she was given IV lasix and subsequently dropped her pressure into the 70s after brisk UOP following the diuretics. 250cc bolus given back and cardiology called to come see pt to help w/ management. Pt SBP in 80s upon my exam. She has frequent VE on the monitor w/ occasional couplets.   Past Medical History:  Diagnosis Date  . Acute on chronic systolic (congestive) heart failure ([place]) 04/29/2017  . Acute pain of right shoulder 06/16/2017  . AICD (automatic cardioverter/defibrillator) present   . Anxiety   . Arthritis    right shoulder   . Asthma   . CHF (congestive heart failure) ([place])   . Chronic combined systolic and diastolic heart failure ([place]) 03/05/2014  . Closed low lateral malleolus fracture 10/23/2013  . Cystitis 10/21/2017  . Depression   . Depression with anxiety 01/20/2013  . Diabetes mellitus without complication ([place])   . Diverticulosis   . Dyspnea    comes and goes intermittently mostly with exertion   . Essential hypertension  Prev followed by [facility]/ Cardiology   . Fibroid    age 79  . Gallstones   . Generalized abdominal cramping   . History of cardiomyopathy   . Hypertension   . IBS (irritable bowel syndrome)   . 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 Insomnia 05/07/2017  . Labral tear of shoulder 04/04/2015    Injected 04/04/2015 Injected 12/03/2015   . Migraine    "monthly" (08/03/2016)  . Myofascial pain 06/16/2017  . NICM (nonischemic cardiomyopathy) ([place]) 08/03/2016  . Nonallopathic lesion of lumbosacral region 11/16/2016  .  Nonallopathic lesion of sacral region 11/16/2016  . Nonallopathic lesion of thoracic region 08/20/2014  . Nonspecific chest pain 04/28/2017  . OSA (obstructive sleep apnea) 01/02/2013   NPSG 2009:  AHI 9/hr. CPAP intolerance >> "smothering" Good tolerance of auto device (optimal pressure 12-13 on download).  - referred to Dr Gwenette Greet    . OSA on CPAP   . Ovarian cyst    1999; surgically removed  . Patellofemoral syndrome of both knees 10/16/2016  . 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 PVC (premature ventricular contraction) 06/23/2016  . Seizures ([place])    "as a child" (08/03/2016)  . Termination of pregnancy    due to cardiac risk    Past Surgical History:  Procedure Laterality Date  . CARDIAC CATHETERIZATION N/A 11/11/2015   Procedure: Right/Left Heart Cath and Coronary Angiography;  Surgeon: Troy Sine, MD;  Location: [place] [facility];  Service: Cardiovascular;  Laterality: N/A;  . CARDIAC CATHETERIZATION  ~ 2015  . CARDIAC DEFIBRILLATOR PLACEMENT  08/03/2016  . CESAREAN SECTION  1999  . COLONOSCOPY WITH PROPOFOL N/A 04/21/2016   Procedure: COLONOSCOPY WITH PROPOFOL;  Surgeon: Jerene Bears, MD;  Location: [facility];  Service: Gastroenterology;  Laterality: N/A;  . ESOPHAGOGASTRODUODENOSCOPY (EGD) WITH PROPOFOL N/A 04/21/2016   Procedure: ESOPHAGOGASTRODUODENOSCOPY (EGD) WITH PROPOFOL;  Surgeon: Jerene Bears, MD;  Location: [facility];  Service: Gastroenterology;  Laterality: N/A;  . FOOT FRACTURE SURGERY Right ~ 2003  . FRACTURE SURGERY    . ICD IMPLANT N/A 08/03/2016   Procedure: ICD Implant;  Surgeon: Deboraha Sprang, MD;  Location: [place] [facility];  Service: Cardiovascular;  Laterality: N/A;  . LAPAROSCOPIC CHOLECYSTECTOMY  12/2006  . LAPAROSCOPIC GASTRIC SLEEVE RESECTION    . LAPAROSCOPY ABDOMEN DIAGNOSTIC  2008   "cut bile duct w/gallbladder OR; had to go in later & fix leak; hospitalized for 2 months"  . LEFT HEART CATHETERIZATION WITH CORONARY  ANGIOGRAM N/A 02/26/2014   Procedure: LEFT HEART CATHETERIZATION WITH CORONARY ANGIOGRAM;  Surgeon: Jettie Booze, MD;  Location[facility];  Service: Cardiovascular;  Laterality: N/A;  . OVARIAN CYST REMOVAL Right 1999  . PVC ABLATION N/A 12/01/2017   Procedure: PVC ABLATION;  Surgeon: Evans Lance, MD;  Location: [place] [facility];  Service: Cardiovascular;  Laterality: N/A;  . RIGHT HEART CATH N/A 08/05/2017   Procedure: RIGHT HEART CATH;  Surgeon: Jolaine Artist, MD;  Location: [place] [facility];  Service: Cardiovascular;  Laterality: N/A;  . RIGHT HEART CATH N/A 11/16/2019   Procedure: RIGHT HEART CATH;  Surgeon: Jolaine Artist, MD;  Location: [place] [facility];  Service: Cardiovascular;  Laterality: N/A;  . TUBAL LIGATION  1999     Home Medications:  Prior to Admission medications   Medication Sig Start Date End Date Taking? Authorizing Provider  amiodarone (PACERONE) 200 MG tablet Take 1 tablet (  200 mg total) by mouth daily. 05/30/20  Yes Bensimhon, Shaune Pascal, MD  budesonide-formoterol [facility]) 160-4.5 MCG/ACT inhaler Inhale 2 puffs into the lungs 2 (two) times daily as needed. 05/30/20  Yes Hoyt Koch, MD  carvedilol (COREG) 12.5 MG tablet Take 1.5 tablets (18.75 mg total) by mouth 2 (two) times daily. 05/30/20 06/29/20 Yes Bensimhon, Shaune Pascal, MD  cyanocobalamin (,VITAMIN B-12,) 1000 MCG/ML injection Inject 1 mL (1,000 mcg total) into the muscle as directed. Once daily for 7 days, then once weekly for 4 weeks followed by once monthly 08/07/20  Yes Iruku, Arletha Pili, MD  diclofenac (VOLTAREN) 75 MG EC tablet Take 1 tablet (75 mg total) by mouth 2 (two) times daily. 06/18/20  Yes Hoyt Koch, MD  diclofenac Sodium (VOLTAREN) 1 % GEL Apply 4 g topically 4 (four) times daily. 05/30/20  Yes Hoyt Koch, MD  dicyclomine (BENTYL) 20 MG tablet Take 1 tablet (20 mg total) by mouth daily as needed for spasms. 05/30/20  Yes Hoyt Koch, MD   fluticasone [facility]) 50 MCG/ACT nasal spray Place 2 sprays into both nostrils daily as needed for allergies. 07/25/18  Yes Hoyt Koch, MD  gabapentin (NEURONTIN) 300 MG capsule Take 1 capsule (300 mg total) by mouth at bedtime as needed (back spasms). 05/30/20  Yes Hoyt Koch, MD  ipratropium-albuterol (DUONEB) 0.5-2.5 (3) MG/3ML SOLN Take 3 mLs by nebulization every 6 (six) hours as needed. 11/17/19 12/17/19 Yes Dahal, Marlowe Aschoff, MD  ivabradine (CORLANOR) 7.5 MG TABS tablet Take 1 tablet (7.5 mg total) by mouth 2 (two) times daily with a meal. 05/30/20  Yes Bensimhon, Shaune Pascal, MD  lidocaine (LIDODERM) 5 % Place 1 patch onto the skin daily. Remove & Discard patch within 12 hours or as directed by MD 05/30/20  Yes Hoyt Koch, MD  Magnesium Oxide 200 MG TABS Take 1 tablet (200 mg total) by mouth daily. 05/30/20  Yes Bensimhon, Shaune Pascal, MD  montelukast (SINGULAIR) 10 MG tablet Take 1 tablet (10 mg total) by mouth at bedtime. 09/13/19  Yes Fenton Foy, NP  Multiple Vitamins-Minerals (MULTIVITAMIN GUMMIES WOMENS PO) Take 2 tablets by mouth at bedtime.    Yes [provider]  nitroGLYCERIN (NITROSTAT) 0.4 MG SL tablet Place 1 tablet (0.4 mg total) under the tongue every 5 (five) minutes as needed for chest pain. 05/30/20  Yes Bensimhon, Shaune Pascal, MD  omeprazole (PRILOSEC) 20 MG capsule Take 1 capsule (20 mg total) by mouth daily as needed (for reflux symptoms). 06/18/20 09/13/22 Yes Hoyt Koch, MD  sacubitril-valsartan (ENTRESTO) 97-103 MG Take 1 tablet by mouth 2 (two) times daily. 05/30/20  Yes Bensimhon, Shaune Pascal, MD  tiZANidine (ZANAFLEX) 4 MG tablet Take 4 mg by mouth every 8 (eight) hours as needed for muscle spasms.  12/02/18  Yes [provider]  torsemide (DEMADEX) 20 MG tablet Take 1 tablet (20 mg total) by mouth daily. 05/30/20 06/29/20 Yes Bensimhon, Shaune Pascal, MD  Vitamin D, Ergocalciferol, (DRISDOL) 1.25 MG (50000 UNIT) CAPS capsule Take 1 capsule  (50,000 Units total) by mouth every 7 (seven) days. 05/30/20  Yes Hoyt Koch, MD  albuterol [facility] HFA) [phone] Base) MCG/ACT inhaler Inhale 1-2 puffs into the lungs every 6 (six) hours as needed for wheezing or shortness of breath. Patient not taking: Reported on 08/25/2020 08/17/19   Biagio Borg, MD  butalbital-acetaminophen-caffeine (FIORICET) [phone] MG tablet Take 1-2 tablets by mouth every 6 (six) hours as needed for headache. Patient not  taking: Reported on 08/25/2020    [provider]  escitalopram (LEXAPRO) 10 MG tablet Take 1 tablet (10 mg total) by mouth daily. Patient not taking: Reported on 08/25/2020 11/30/19   Hoyt Koch, MD  Ibuprofen-Famotidine (DUEXIS) 800-26.6 MG TABS Take 1 tablet by mouth 2 (two) times daily as needed. Patient not taking: Reported on 08/25/2020 06/18/20   Hoyt Koch, MD  Ubrogepant (UBRELVY) 50 MG TABS Take 50 mg by mouth every 2 (two) hours as needed (migraine). Patient not taking: Reported on 08/25/2020 10/17/19   Hoyt Koch, MD    Inpatient Medications: Scheduled Meds: . amiodarone  200 mg Oral Daily  . enoxaparin (LOVENOX) injection  40 mg Subcutaneous Q24H  . insulin aspart  0-9 Units Subcutaneous Q4H  . [START ON 08/26/2020] ivabradine  7.5 mg Oral BID WC  . mometasone-formoterol  2 puff Inhalation BID  . montelukast  10 mg Oral QHS  . sodium chloride flush  3 mL Intravenous Q12H   Continuous Infusions: . sodium chloride     PRN Meds: sodium chloride, acetaminophen, gabapentin, ondansetron (ZOFRAN) IV, sodium chloride flush  Allergies:    Allergies  Allergen Reactions  . Vancomycin Other (See Comments)    "did something to my kidneys," PROGRESSED TO KIDNEY FAILURE!!  Marland Kitchen Aspirin Other (See Comments)    Wheezing, (Pt states that she just wheezes some when she takes aspirin by itself but she can take aspirin in a combination product).  . Contrast Media [Iodinated Diagnostic Agents] Other (See Comments)     Multiple CT contrast studies done over 2 weeks caused ARF  . Avocado Itching and Swelling  . Ciprofloxacin Itching and Rash  . Farxiga [Dapagliflozin] Rash  . Sulfa Antibiotics Itching and Rash    Social History:   Social History   Socioeconomic History  . Marital status: Married    Spouse name: Not on file  . Number of children: 1  . Years of education: Not on file  . Highest education level: Not on file  Occupational History  . Occupation: stay at home mom  Tobacco Use  . Smoking status: Never Smoker  . Smokeless tobacco: Never Used  Vaping Use  . Vaping Use: Never used  Substance and Sexual Activity  . Alcohol use: No  . Drug use: No  . Sexual activity: Yes  Other Topics Concern  . Not on file  Social History Narrative  . Not on file   Social Determinants of Health   Financial Resource Strain: Not on file  Food Insecurity: Not on file  Transportation Needs: Not on file  Physical Activity: Insufficiently Active  . Days of Exercise per Week: 2 days  . Minutes of Exercise per Session: 40 min  Stress: Not on file  Social Connections: Socially Isolated  . Frequency of Communication with Friends and Family: Twice a week  . Frequency of Social Gatherings with Friends and Family: Never  . Attends Religious Services: Never  . Active Member of Clubs or Organizations: No  . Attends Archivist Meetings: Never  . Marital Status: Married  Human resources officer Violence: Not on file    Family History:    Family History  Problem Relation Age of Onset  . Emphysema Maternal Grandmother        smoked  . Heart disease Maternal Grandmother 73       MI  . Rheum arthritis Mother   . Allergies Daughter   . Colon cancer Neg Hx  ROS:  Please see the history of present illness.   All other ROS reviewed and negative.     Physical Exam/Data:   Vitals:   08/25/20 2100 08/25/20 2130 08/25/20 2200 08/25/20 2241  BP: 121/66 113/78 114/82 (!) 83/67  Pulse: (!)  119 91 88 (!) 40  Resp: (!) 36 (!) 33 20 (!) 28  Temp:      TempSrc:      SpO2: 100% 97% 100% 94%  Weight:        Intake/Output Summary (Last 24 hours) at 08/25/2020 2257 Last data filed at 08/25/2020 2148 Gross per 24 hour  Intake -  Output 1400 ml  Net -1400 ml   Last 3 Weights 08/25/2020 08/06/2020 07/29/2020  Weight (lbs) 231 lb 4.2 oz 225 lb 11.2 oz 229 lb  Weight (kg) 104.9 kg 102.377 kg 103.874 kg     Body mass index is 38.48 kg/m.  General:  Well nourished, well developed, in no acute distress HEENT: normal Lymph: no adenopathy Neck: no JVD Endocrine:  No thryomegaly Vascular: No carotid bruits; DP pulses 2+ bilaterally  Cardiac:  normal S1, S2; RRR; no murmur  Lungs:  clear to auscultation bilaterally, no wheezing, rhonchi or rales  Abd: soft, nontender, no hepatomegaly  Ext: no edema Musculoskeletal:  No deformities Skin: warm and dry  Neuro:   no focal abnormalities noted Psych:  Normal affect   EKG:  The EKG was personally reviewed and demonstrates:  ST with frequent PVCs, VBG Telemetry:  Telemetry was personally reviewed and demonstrates:  NSR with frequent PVC, occas PVC couplets  Relevant CV Studies: TTE 11-13-19 1. Severe global reduction in LV systolic function; mild LVE; moderate  LAE.  2. Left ventricular ejection fraction, by estimation, is <20%. The left  ventricle has severely decreased function. The left ventricle demonstrates  global hypokinesis. The left ventricular internal cavity size was mildly  dilated. Left ventricular  diastolic parameters are indeterminate.  3. Right ventricular systolic function is normal. The right ventricular  size is normal. There is normal pulmonary artery systolic pressure.  4. Left atrial size was moderately dilated.  5. The mitral valve is normal in structure. Trivial mitral valve  regurgitation. No evidence of mitral stenosis.  6. The aortic valve is tricuspid. Aortic valve regurgitation is not  visualized. No  aortic stenosis is present.  7. The inferior vena cava is normal in size with greater than 50%  respiratory variability, suggesting right atrial pressure of 3 mmHg.   LHC/RHC June 2017 Mild pulmonary hypertension.  Severe global LV dysfunction with diffuse hypokinesis and an ejection fraction of 25-30%.  Normal coronary arteries.  Findings are suggestive of a nonischemic cardiomyopathy in this patient who developed postpartum cardiomyopathy following delivery of her first and only child in 1999. RECOMMENDATION: Maximization of medical therapy.  [place] 11-16-19 Findings:  RA = 4 RV = 32/6 PA = 31/8 (21) PCW = 12 Fick cardiac output/index = 7.0/3.4 Thermo CO/CI = 5.2/2.5 PVR =1.7 (Thermo) Ao sat = 98% PA sat = 67%, 69%  Assessment: 1. Well-compensated volume status and outputs   Laboratory Data:  High Sensitivity Troponin:   Recent Labs  Lab 08/25/20 1419 08/25/20 1610  TROPONINIHS 5 6     Chemistry Recent Labs  Lab 08/25/20 1419  NA 136  K 3.8  CL 106  CO2 25  GLUCOSE 92  BUN 8  CREATININE 0.69  CALCIUM 9.0  GFRNONAA >60  ANIONGAP 5    No results for input(s): PROT,  ALBUMIN, AST, ALT, ALKPHOS, BILITOT in the last 168 hours. Hematology Recent Labs  Lab 08/25/20 1419  WBC 7.6  RBC 4.54  HGB 9.1*  HCT 32.2*  MCV 70.9*  MCH 20.0*  MCHC 28.3*  RDW 17.7*  PLT 307   BNP Recent Labs  Lab 08/25/20 1419  BNP 301.6*    DDimer No results for input(s): DDIMER in the last 168 hours.   Radiology/Studies:  DG Chest Portable 1 View  Result Date: 08/25/2020 CLINICAL DATA:  Chest pain EXAM: PORTABLE CHEST 1 VIEW COMPARISON:  November 27, 2019. FINDINGS: Enlarged cardiac silhouette. Left subclavian approach single lead cardiac rhythm maintenance device in similar position. No consolidation. No visible pleural effusions or pneumothorax. No evidence of acute osseous abnormality. IMPRESSION: 1. No acute cardiopulmonary disease. 2. Cardiomegaly. Electronically  Signed   By: Margaretha Sheffield MD   On: 08/25/2020 15:46     Assessment and Plan:   1. CP: h/o clean cors. CP is atypical, seems to be coming from frequent, symptomatic PVCs. VE burden seems to be increased from prior. She is on chronic maintenance dose amiodarone; will bolus w/ IV and start amio infusion until she can be seen by EP tomorrow. She has had unsuccessful PVC ablation in the past; she may need additional med w/ amiodarone or switch to alternative anti-arrhythmic to help control her VE. She does not appear to be having acute volume overload; her CXR was clear, BNP not particularly elevated and exam not c/w volume overload. I think the diuretics she received may have over-diuresed her a little and caused the drop in SBP. Will give additional 250cc 1/2NS to re-hydrate. Would hold diuretics for now. Can resume home PO regimen once euvolemic. 2. NICM: last EF 20%. Will get updated TTE. Would resume home HF regimen including entresto, corlanor, coreg. As aforementioned, I do not think she is clinically decompensated from a HF standpoint. 3. Frequent PVCs: see #1; will have EP see her in the AM   Risk Assessment/Risk Scores:  {Complete the following score calculators/questions to meet required metrics.  Press F2         :[id number]}   HEAR Score (for undifferentiated chest pain):   { If score is blank >> Go to the HEART Pathway tab, then [facility] your note.     :[id number]} {Does this patient have CHF or CHF symptoms?      :[id number]} {Does this patient have ATRIAL FIBRILLATION?:[phone]}  {Are we signing off today?:[id number]}  For questions or updates, please contact [place] Please consult [email or web address] for contact info under    Signed, Rudean Curt, MD  08/25/2020 10:57 PM

## 2020-08-25 NOTE — ED Notes (Signed)
Dr. Roel Cluck advised that cardiology is coming to assess pt.

## 2020-08-25 NOTE — ED Notes (Signed)
Pt advised that spouse is coming to visit and bringing her [employer]. Advised that [employer] will not improve her current status due to the sodium in the meal. Pt verbalized understanding.

## 2020-08-25 NOTE — Consult Note (Signed)
Cardiology Consultation:   Patient ID: [name] MRN: [id number]; DOB: Apr 01, 1976  Admit date: 08/25/2020 Date of Consult: 08/26/2020  PCP:  Hoyt Koch, MD   [facility]  Cardiologist:  Sinclair Grooms, MD  Advanced Practice Provider:  No care team member to display Electrophysiologist:  Cristopher Peru, MD        Patient Profile:   [name] is a 45 y.o. 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 Roel Cluck ([facility]).  History of Present Illness:   [name] has a h/o peripartum CM w/ onset in 1999, most recent LVEF<20% in 2021, normal cors on LHC in 2017, h/o frequent PVCs and VT s/p ICD (on chronic amiodarone as per EP; s/p unsuccessful PVC ablation in 2019) follows chronically w/ HF clinic and presents to ED today c/o worsening chest tightness and DOE over the past 2 days. She describes the discomfort as a sharp or squeezing pain under her L breast w/o associated nausea, diaphoresis, or palpitations. This discomfort occurs in association with PVCs; she states she has noticed increasing palpitations that seem to bring on the pain over the past couple days. She does not think she is retaining fluid; she states she can normally tell when her volume status is off, and she has not noticed increased fluid retention over this period. She thinks the issue is that PVCs are occurring much more frequently than in the past, and that they are making her very uncomfortable. She has had frequent PVC on telemetry in the ED, and says her sx do seem to correspond to the ventricular ectopy. Her VE burden in the past has been as high as 17% (noted on zio in 2020 per HF clinic note). She was admitted in July 2021 w/ similar sx, and frequent PVCs. Coreg was increased to 18.75 bid at that time.  It appears pt's device notified HF clinic on 06-19-20 that volume  status was tenuous; pt was instructed to double torsemide dose x 3 days and take metolazone 2.5mg  daily for 2 days. Subsequent Heart[facility] HF index 0 recorded on 07-22-20. In ED today, CXR showed no acute cardiopulmonary disease. BNP 301. BUN 8, Cr 0.69, K 3.8. HS trop negative x 2 sets. She was initially admitted to hospital medicine, and both ED and hospitalist assumed pt was likely volume overloaded; she was given IV lasix and subsequently dropped her pressure into the 70s after brisk UOP following the diuretics. 250cc bolus given back and cardiology called to come see pt to help w/ management. Pt SBP in 80s upon my exam. She has frequent VE on the monitor w/ occasional couplets.   Past Medical History:  Diagnosis Date  . Acute on chronic systolic (congestive) heart failure ([place]) 04/29/2017  . Acute pain of right shoulder 06/16/2017  . AICD (automatic cardioverter/defibrillator) present   . Anxiety   . Arthritis    right shoulder   . Asthma   . CHF (congestive heart failure) ([place])   . Chronic combined systolic and diastolic heart failure ([place]) 03/05/2014  . Closed low lateral malleolus fracture 10/23/2013  . Cystitis 10/21/2017  . Depression   . Depression with anxiety 01/20/2013  . Diabetes mellitus without complication ([place])   . Diverticulosis   . Dyspnea    comes and goes intermittently mostly with exertion   . Essential hypertension  Prev followed by [facility]/ Cardiology   . Fibroid    age 67  . Gallstones   . Generalized abdominal cramping   . History of cardiomyopathy   . Hypertension   . IBS (irritable bowel syndrome)   . 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 Insomnia 05/07/2017  . Labral tear of shoulder 04/04/2015    Injected 04/04/2015 Injected 12/03/2015   . Migraine    "monthly" (08/03/2016)  . Myofascial pain 06/16/2017  . NICM (nonischemic cardiomyopathy) ([place]) 08/03/2016  . Nonallopathic lesion of lumbosacral region 11/16/2016  .  Nonallopathic lesion of sacral region 11/16/2016  . Nonallopathic lesion of thoracic region 08/20/2014  . Nonspecific chest pain 04/28/2017  . OSA (obstructive sleep apnea) 01/02/2013   NPSG 2009:  AHI 9/hr. CPAP intolerance >> "smothering" Good tolerance of auto device (optimal pressure 12-13 on download).  - referred to Dr [name]    . OSA on CPAP   . Ovarian cyst    1999; surgically removed  . Patellofemoral syndrome of both knees 10/16/2016  . 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 PVC (premature ventricular contraction) 06/23/2016  . Seizures ([place])    "as a child" (08/03/2016)  . Termination of pregnancy    due to cardiac risk    Past Surgical History:  Procedure Laterality Date  . CARDIAC CATHETERIZATION N/A 11/11/2015   Procedure: Right/Left Heart Cath and Coronary Angiography;  Surgeon: Troy Sine, MD;  Location: [place] [facility];  Service: Cardiovascular;  Laterality: N/A;  . CARDIAC CATHETERIZATION  ~ 2015  . CARDIAC DEFIBRILLATOR PLACEMENT  08/03/2016  . CESAREAN SECTION  1999  . COLONOSCOPY WITH PROPOFOL N/A 04/21/2016   Procedure: COLONOSCOPY WITH PROPOFOL;  Surgeon: Jerene Bears, MD;  Location: [facility];  Service: Gastroenterology;  Laterality: N/A;  . ESOPHAGOGASTRODUODENOSCOPY (EGD) WITH PROPOFOL N/A 04/21/2016   Procedure: ESOPHAGOGASTRODUODENOSCOPY (EGD) WITH PROPOFOL;  Surgeon: Jerene Bears, MD;  Location: [facility];  Service: Gastroenterology;  Laterality: N/A;  . FOOT FRACTURE SURGERY Right ~ 2003  . FRACTURE SURGERY    . ICD IMPLANT N/A 08/03/2016   Procedure: ICD Implant;  Surgeon: Deboraha Sprang, MD;  Location: [place] [facility];  Service: Cardiovascular;  Laterality: N/A;  . LAPAROSCOPIC CHOLECYSTECTOMY  12/2006  . LAPAROSCOPIC GASTRIC SLEEVE RESECTION    . LAPAROSCOPY ABDOMEN DIAGNOSTIC  2008   "cut bile duct w/gallbladder OR; had to go in later & fix leak; hospitalized for 2 months"  . LEFT HEART CATHETERIZATION WITH CORONARY  ANGIOGRAM N/A 02/26/2014   Procedure: LEFT HEART CATHETERIZATION WITH CORONARY ANGIOGRAM;  Surgeon: Jettie Booze, MD;  Location: [facility];  Service: Cardiovascular;  Laterality: N/A;  . OVARIAN CYST REMOVAL Right 1999  . PVC ABLATION N/A 12/01/2017   Procedure: PVC ABLATION;  Surgeon: Evans Lance, MD;  Location: [place] [facility];  Service: Cardiovascular;  Laterality: N/A;  . RIGHT HEART CATH N/A 08/05/2017   Procedure: RIGHT HEART CATH;  Surgeon: Jolaine Artist, MD;  Location: [place] [facility];  Service: Cardiovascular;  Laterality: N/A;  . RIGHT HEART CATH N/A 11/16/2019   Procedure: RIGHT HEART CATH;  Surgeon: Jolaine Artist, MD;  Location: [place] [facility];  Service: Cardiovascular;  Laterality: N/A;  . TUBAL LIGATION  1999     Home Medications:  Prior to Admission medications   Medication Sig Start Date End Date Taking? Authorizing Provider  amiodarone (PACERONE) 200 MG tablet Take 1 tablet (  200 mg total) by mouth daily. 05/30/20  Yes Bensimhon, Shaune Pascal, MD  budesonide-formoterol [facility]) 160-4.5 MCG/ACT inhaler Inhale 2 puffs into the lungs 2 (two) times daily as needed. 05/30/20  Yes Hoyt Koch, MD  carvedilol (COREG) 12.5 MG tablet Take 1.5 tablets (18.75 mg total) by mouth 2 (two) times daily. 05/30/20 06/29/20 Yes Bensimhon, Shaune Pascal, MD  cyanocobalamin (,VITAMIN B-12,) 1000 MCG/ML injection Inject 1 mL (1,000 mcg total) into the muscle as directed. Once daily for 7 days, then once weekly for 4 weeks followed by once monthly 08/07/20  Yes Iruku, Arletha Pili, MD  diclofenac (VOLTAREN) 75 MG EC tablet Take 1 tablet (75 mg total) by mouth 2 (two) times daily. 06/18/20  Yes Hoyt Koch, MD  diclofenac Sodium (VOLTAREN) 1 % GEL Apply 4 g topically 4 (four) times daily. 05/30/20  Yes Hoyt Koch, MD  dicyclomine (BENTYL) 20 MG tablet Take 1 tablet (20 mg total) by mouth daily as needed for spasms. 05/30/20  Yes Hoyt Koch, MD   fluticasone [facility]) 50 MCG/ACT nasal spray Place 2 sprays into both nostrils daily as needed for allergies. 07/25/18  Yes Hoyt Koch, MD  gabapentin (NEURONTIN) 300 MG capsule Take 1 capsule (300 mg total) by mouth at bedtime as needed (back spasms). 05/30/20  Yes Hoyt Koch, MD  ipratropium-albuterol (DUONEB) 0.5-2.5 (3) MG/3ML SOLN Take 3 mLs by nebulization every 6 (six) hours as needed. 11/17/19 12/17/19 Yes Dahal, Marlowe Aschoff, MD  ivabradine (CORLANOR) 7.5 MG TABS tablet Take 1 tablet (7.5 mg total) by mouth 2 (two) times daily with a meal. 05/30/20  Yes Bensimhon, Shaune Pascal, MD  lidocaine (LIDODERM) 5 % Place 1 patch onto the skin daily. Remove & Discard patch within 12 hours or as directed by MD 05/30/20  Yes Hoyt Koch, MD  Magnesium Oxide 200 MG TABS Take 1 tablet (200 mg total) by mouth daily. 05/30/20  Yes Bensimhon, Shaune Pascal, MD  montelukast (SINGULAIR) 10 MG tablet Take 1 tablet (10 mg total) by mouth at bedtime. 09/13/19  Yes Fenton Foy, NP  Multiple Vitamins-Minerals (MULTIVITAMIN GUMMIES WOMENS PO) Take 2 tablets by mouth at bedtime.    Yes [provider]  nitroGLYCERIN (NITROSTAT) 0.4 MG SL tablet Place 1 tablet (0.4 mg total) under the tongue every 5 (five) minutes as needed for chest pain. 05/30/20  Yes Bensimhon, Shaune Pascal, MD  omeprazole (PRILOSEC) 20 MG capsule Take 1 capsule (20 mg total) by mouth daily as needed (for reflux symptoms). 06/18/20 09/13/22 Yes Hoyt Koch, MD  sacubitril-valsartan (ENTRESTO) 97-103 MG Take 1 tablet by mouth 2 (two) times daily. 05/30/20  Yes Bensimhon, Shaune Pascal, MD  tiZANidine (ZANAFLEX) 4 MG tablet Take 4 mg by mouth every 8 (eight) hours as needed for muscle spasms.  12/02/18  Yes [provider]  torsemide (DEMADEX) 20 MG tablet Take 1 tablet (20 mg total) by mouth daily. 05/30/20 06/29/20 Yes Bensimhon, Shaune Pascal, MD  Vitamin D, Ergocalciferol, (DRISDOL) 1.25 MG (50000 UNIT) CAPS capsule Take 1 capsule  (50,000 Units total) by mouth every 7 (seven) days. 05/30/20  Yes Hoyt Koch, MD  albuterol [facility] HFA) [phone] Base) MCG/ACT inhaler Inhale 1-2 puffs into the lungs every 6 (six) hours as needed for wheezing or shortness of breath. Patient not taking: Reported on 08/25/2020 08/17/19   Biagio Borg, MD  butalbital-acetaminophen-caffeine (FIORICET) [phone] MG tablet Take 1-2 tablets by mouth every 6 (six) hours as needed for headache. Patient not  taking: Reported on 08/25/2020    [provider]  escitalopram (LEXAPRO) 10 MG tablet Take 1 tablet (10 mg total) by mouth daily. Patient not taking: Reported on 08/25/2020 11/30/19   Hoyt Koch, MD  Ibuprofen-Famotidine (DUEXIS) 800-26.6 MG TABS Take 1 tablet by mouth 2 (two) times daily as needed. Patient not taking: Reported on 08/25/2020 06/18/20   Hoyt Koch, MD  Ubrogepant (UBRELVY) 50 MG TABS Take 50 mg by mouth every 2 (two) hours as needed (migraine). Patient not taking: Reported on 08/25/2020 10/17/19   Hoyt Koch, MD    Inpatient Medications: Scheduled Meds: . amiodarone  150 mg Intravenous Once  . enoxaparin (LOVENOX) injection  40 mg Subcutaneous Q24H  . insulin aspart  0-9 Units Subcutaneous Q4H  . ivabradine  7.5 mg Oral BID WC  . mometasone-formoterol  2 puff Inhalation BID  . montelukast  10 mg Oral QHS  . sodium chloride flush  3 mL Intravenous Q12H   Continuous Infusions: . sodium chloride    . sodium chloride    . amiodarone     Followed by  . amiodarone     PRN Meds: sodium chloride, acetaminophen, gabapentin, ondansetron (ZOFRAN) IV, sodium chloride flush  Allergies:    Allergies  Allergen Reactions  . Vancomycin Other (See Comments)    "did something to my kidneys," PROGRESSED TO KIDNEY FAILURE!!  Marland Kitchen Aspirin Other (See Comments)    Wheezing, (Pt states that she just wheezes some when she takes aspirin by itself but she can take aspirin in a combination product).  . Contrast  Media [Iodinated Diagnostic Agents] Other (See Comments)    Multiple CT contrast studies done over 2 weeks caused ARF  . Avocado Itching and Swelling  . Ciprofloxacin Itching and Rash  . Farxiga [Dapagliflozin] Rash  . Sulfa Antibiotics Itching and Rash    Social History:   Social History   Socioeconomic History  . Marital status: Married    Spouse name: Not on file  . Number of children: 1  . Years of education: Not on file  . Highest education level: Not on file  Occupational History  . Occupation: stay at home mom  Tobacco Use  . Smoking status: Never Smoker  . Smokeless tobacco: Never Used  Vaping Use  . Vaping Use: Never used  Substance and Sexual Activity  . Alcohol use: No  . Drug use: No  . Sexual activity: Yes  Other Topics Concern  . Not on file  Social History Narrative  . Not on file   Social Determinants of Health   Financial Resource Strain: Not on file  Food Insecurity: Not on file  Transportation Needs: Not on file  Physical Activity: Insufficiently Active  . Days of Exercise per Week: 2 days  . Minutes of Exercise per Session: 40 min  Stress: Not on file  Social Connections: Socially Isolated  . Frequency of Communication with Friends and Family: Twice a week  . Frequency of Social Gatherings with Friends and Family: Never  . Attends Religious Services: Never  . Active Member of Clubs or Organizations: No  . Attends Archivist Meetings: Never  . Marital Status: Married  Human resources officer Violence: Not on file    Family History:    Family History  Problem Relation Age of Onset  . Emphysema Maternal Grandmother        smoked  . Heart disease Maternal Grandmother 66       MI  . Rheum  arthritis Mother   . Allergies Daughter   . Colon cancer Neg Hx      ROS:  Please see the history of present illness.   All other ROS reviewed and negative.     Physical Exam/Data:   Vitals:   08/25/20 2305 08/25/20 2315 08/25/20 2330 08/25/20  2340  BP: (!) 72/47 (!) 80/53 (!) 83/73 94/65  Pulse: 75 81 79 79  Resp: (!) 22 (!) 30 15 18   Temp:      TempSrc:      SpO2: 100% 100% 100% 91%  Weight:        Intake/Output Summary (Last 24 hours) at 08/26/2020 0003 Last data filed at 08/25/2020 2148 Gross per 24 hour  Intake --  Output 1400 ml  Net -1400 ml   Last 3 Weights 08/25/2020 08/06/2020 07/29/2020  Weight (lbs) 231 lb 4.2 oz 225 lb 11.2 oz 229 lb  Weight (kg) 104.9 kg 102.377 kg 103.874 kg     Body mass index is 38.48 kg/m.  General:  Well nourished, well developed, in no acute distress HEENT: normal Lymph: no adenopathy Neck: no JVD Endocrine:  No thryomegaly Vascular: No carotid bruits; DP pulses 2+ bilaterally  Cardiac:  normal S1, S2; RRR with frequent ectopy; no murmur  Lungs:  clear to auscultation bilaterally, no wheezing, rhonchi or rales  Abd: obese, soft, nontender, no hepatomegaly  Ext: no edema Musculoskeletal:  No deformities Skin: warm and dry  Neuro:   no focal abnormalities noted Psych:  Normal affect   EKG:  The EKG was personally reviewed and demonstrates:  ST with frequent PVCs, VBG Telemetry:  Telemetry was personally reviewed and demonstrates:  NSR with frequent PVC, occas PVC couplets  Relevant CV Studies: TTE 11-13-19 1. Severe global reduction in LV systolic function; mild LVE; moderate  LAE.  2. Left ventricular ejection fraction, by estimation, is <20%. The left  ventricle has severely decreased function. The left ventricle demonstrates  global hypokinesis. The left ventricular internal cavity size was mildly  dilated. Left ventricular  diastolic parameters are indeterminate.  3. Right ventricular systolic function is normal. The right ventricular  size is normal. There is normal pulmonary artery systolic pressure.  4. Left atrial size was moderately dilated.  5. The mitral valve is normal in structure. Trivial mitral valve  regurgitation. No evidence of mitral stenosis.  6. The  aortic valve is tricuspid. Aortic valve regurgitation is not  visualized. No aortic stenosis is present.  7. The inferior vena cava is normal in size with greater than 50%  respiratory variability, suggesting right atrial pressure of 3 mmHg.   LHC/RHC June 2017 Mild pulmonary hypertension.  Severe global LV dysfunction with diffuse hypokinesis and an ejection fraction of 25-30%.  Normal coronary arteries.  Findings are suggestive of a nonischemic cardiomyopathy in this patient who developed postpartum cardiomyopathy following delivery of her first and only child in 1999. RECOMMENDATION: Maximization of medical therapy.  [place] 11-16-19 Findings:  RA = 4 RV = 32/6 PA = 31/8 (21) PCW = 12 Fick cardiac output/index = 7.0/3.4 Thermo CO/CI = 5.2/2.5 PVR =1.7 (Thermo) Ao sat = 98% PA sat = 67%, 69%  Assessment: 1. Well-compensated volume status and outputs   Laboratory Data:  High Sensitivity Troponin:   Recent Labs  Lab 08/25/20 1419 08/25/20 1610  TROPONINIHS 5 6     Chemistry Recent Labs  Lab 08/25/20 1419  NA 136  K 3.8  CL 106  CO2 25  GLUCOSE 92  BUN 8  CREATININE 0.69  CALCIUM 9.0  GFRNONAA >60  ANIONGAP 5    No results for input(s): PROT, ALBUMIN, AST, ALT, ALKPHOS, BILITOT in the last 168 hours. Hematology Recent Labs  Lab 08/25/20 1419  WBC 7.6  RBC 4.54  HGB 9.1*  HCT 32.2*  MCV 70.9*  MCH 20.0*  MCHC 28.3*  RDW 17.7*  PLT 307   BNP Recent Labs  Lab 08/25/20 1419  BNP 301.6*    DDimer No results for input(s): DDIMER in the last 168 hours.   Radiology/Studies:  DG Chest Portable 1 View  Result Date: 08/25/2020 CLINICAL DATA:  Chest pain EXAM: PORTABLE CHEST 1 VIEW COMPARISON:  November 27, 2019. FINDINGS: Enlarged cardiac silhouette. Left subclavian approach single lead cardiac rhythm maintenance device in similar position. No consolidation. No visible pleural effusions or pneumothorax. No evidence of acute osseous abnormality.  IMPRESSION: 1. No acute cardiopulmonary disease. 2. Cardiomegaly. Electronically Signed   By: Margaretha Sheffield MD   On: 08/25/2020 15:46     Assessment and Plan:   1. CP: h/o clean cors. CP is atypical, seems to be coming from frequent, symptomatic PVCs. VE burden seems to be increased from prior. She is on chronic maintenance dose amiodarone; will bolus w/ IV and start amio infusion until she can be seen by EP tomorrow. She has had unsuccessful PVC ablation in the past; she may need additional med w/ amiodarone or switch to alternative anti-arrhythmic to help control her VE. She does not appear to be having acute volume overload; her CXR was clear, BNP not particularly elevated and exam not c/w volume overload. I think the diuretics she received may have over-diuresed her a little and caused the drop in SBP. Will give additional 250cc 1/2NS to re-hydrate. Would hold diuretics for now. Can resume home PO regimen once euvolemic. 2. NICM: last EF 20%. Will get updated TTE. Would resume home HF regimen including entresto, corlanor, coreg. As aforementioned, I do not think she is clinically decompensated from a HF standpoint. 3. Frequent PVCs: see #1; will have EP see her in the AM   Risk Assessment/Risk Scores:     HEAR Score (for undifferentiated chest pain):  HEAR Score: 2  New York Heart Association (NYHA) Functional Class NYHA Class II        For questions or updates, please contact [facility] Please consult [email or web address] for contact info under    Signed, Rudean Curt, MD, [facility]  08/26/2020 12:03 AM

## 2020-08-25 NOTE — Progress Notes (Signed)
  Amiodarone Drug - Drug Interaction Consult Note  Recommendations: No specific interactions; will monitor w/ new orders. Amiodarone is metabolized by the cytochrome P450 system and therefore has the potential to cause many drug interactions. Amiodarone has an average plasma half-life of 50 days (range 20 to 100 days).   There is potential for drug interactions to occur several weeks or months after stopping treatment and the onset of drug interactions may be slow after initiating amiodarone.   []  Statins: Increased risk of myopathy. Simvastatin- restrict dose to 20mg  daily. Other statins: counsel patients to report any muscle pain or weakness immediately.  []  Anticoagulants: Amiodarone can increase anticoagulant effect. Consider warfarin dose reduction. Patients should be monitored closely and the dose of anticoagulant altered accordingly, remembering that amiodarone levels take several weeks to stabilize.  []  Antiepileptics: Amiodarone can increase plasma concentration of phenytoin, the dose should be reduced. Note that small changes in phenytoin dose can result in large changes in levels. Monitor patient and counsel on signs of toxicity.  []  Beta blockers: increased risk of bradycardia, AV block and myocardial depression. Sotalol - avoid concomitant use.  []   Calcium channel blockers (diltiazem and verapamil): increased risk of bradycardia, AV block and myocardial depression.  []   Cyclosporine: Amiodarone increases levels of cyclosporine. Reduced dose of cyclosporine is recommended.  []  Digoxin dose should be halved when amiodarone is started.  []  Diuretics: increased risk of cardiotoxicity if hypokalemia occurs.  []  Oral hypoglycemic agents (glyburide, glipizide, glimepiride): increased risk of hypoglycemia. [name] glucose levels should be monitored closely when initiating amiodarone therapy.   []  Drugs that prolong the QT interval:  Torsades de pointes risk may be increased with  concurrent use - avoid if possible.  Monitor QTc, also keep magnesium/potassium WNL if concurrent therapy can't be avoided. Marland Kitchen Antibiotics: e.g. fluoroquinolones, erythromycin. . Antiarrhythmics: e.g. quinidine, procainamide, disopyramide, sotalol. . Antipsychotics: e.g. phenothiazines, haloperidol.  . Lithium, tricyclic antidepressants, and methadone.    Wynona Neat, PharmD, BCPS  08/25/2020 11:56 PM

## 2020-08-25 NOTE — ED Notes (Signed)
Notified Doutova, MD of pt's BP of 75/46. New orders received.

## 2020-08-25 NOTE — H&P (Addendum)
[name] IRW:[id number] DOB: [birth year] DOA: 08/25/2020     PCP: Hoyt Koch, MD   Outpatient Specialists:   CARDS:   Dr. Durene Romans, Lynnell Dike, MD as PCP - Cardiology (Cardiology) Evans Lance, MD as PCP - Electrophysiology (Cardiology)  Tanda Rockers, MD (Pulmonary Disease Oncology Dr. Chryl Heck Patient arrived to ER on 08/25/20 at 1400 Referred by Attending Gareth Morgan, MD   Patient coming from: home Lives  With family    Chief Complaint:   Chief Complaint  Patient presents with  . Chest Pain    HPI: [name] is a 45 y.o. 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  Worse when she tries to ambulate.  No fevers or chills no cough.  No leg edema just overall feeling unwell no other family members have been sick.  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 The left ventricular internal cavity size was mildly dilated. Left ventricular diastolic parameters are indeterminate. On Coreg amiodarone Corlanor Entresto     DM 2 -  Lab Results  Component Value Date   HGBA1C 5.3 05/28/2016   , diet controlled    obesity-    BMI Readings from Last 1 Encounters:  08/06/20 37.56 kg/m       Asthma -well  controlled on home inhalers/ nebs           OSA -on nocturnal  CPAP,       Chronic anemia - baseline hg Hemoglobin & Hematocrit  Recent Labs    07/29/20 1425 08/06/20 1517 08/25/20 1419  HGB 8.9* 9.0* 9.1*    While in ER: Noted to have frequent runs of PVCs on telemetry which was symptomatic. AICD was interrogated evidence of fluid overload noted although chest x-ray less impressive. Troponin normal x2.  BNP elevated into the 300s.  Was given a dose of Lasix Case discussed with cardiology who will pass it onto CHF team in the a.m. for right now recommend continuing home medications and diuresis   ED Triage Vitals  Enc Vitals Group     BP 08/25/20 1418 131/86     Pulse Rate 08/25/20 1418 (!) 101     Resp 08/25/20 1418 18     Temp 08/25/20 1418 98.6 F (37 C)     Temp Source 08/25/20 1418 Oral     SpO2 08/25/20 1418 99 %     Weight --      Height --      Head Circumference --      Peak Flow --      Pain Score 08/25/20 1412 8  Pain Loc --      Pain Edu? --      Excl. in [place]? --   TMAX(24)@     _________________________________________ Significant initial  Findings: Abnormal Labs Reviewed  BRAIN NATRIURETIC PEPTIDE - Abnormal; Notable for the following components:      Result Value   B Natriuretic Peptide 301.6 (*)    All other components within normal limits  CBC WITH DIFFERENTIAL/PLATELET - Abnormal; Notable for the following components:   Hemoglobin 9.1 (*)    HCT 32.2 (*)    MCV 70.9 (*)    MCH 20.0 (*)    MCHC 28.3 (*)    RDW 17.7 (*)    All other components within normal limits   ____________________________________________ Ordered   CXR -  NON acute cardiomegaly    _________________________ Troponin 5-6 ECG: Ordered Personally reviewed by me showing: HR : 101 Rhythm:   Sinus tachycardia with significant amount of ectopy QTC 479 __________________   The recent  clinical data is shown below. Vitals:   08/25/20 1607 08/25/20 1630 08/25/20 1700 08/25/20 1800  BP: 115/64 121/84 121/79 (!) 152/112  Pulse: 94 97 (!) 47 (!) 102  Resp: (!) 28 (!) 31 (!) 23 (!) 25  Temp:      TempSrc:      SpO2: 98% 98% 96% 100%   WBC      Component Value Date/Time   WBC 7.6 08/25/2020 1419   LYMPHSABS 2.7 08/25/2020 1419       UA not ordered     _______________________________________________________ ER Provider Called:  Cardiology    Dr.Fulk  They Recommend admit to medicine   CHF team Will see in AM    _______________________________________________ Hospitalist was called for admission for systolic heart failure exacerbation  The following Work up has been ordered so far:  Orders Placed This Encounter  Procedures  . DG Chest Portable 1 View  . Basic metabolic panel  . Brain natriuretic peptide  . CBC with Differential  . Magnesium  . Interrogate Pacemaker if present  . Consult to hospitalist  . Inpatient consult to Cardiology  . ED EKG      Following Medications were ordered in ER: Medications  furosemide (LASIX) injection 40 mg (40 mg Intravenous Given 08/25/20 1726)        Consult Orders  (From admission, onward)         Start     Ordered   08/25/20 1804  Consult to hospitalist  Once       Provider:  (Not yet assigned)  Question Answer Comment  Place call to: [facility]   Reason for Consult Admit      08/25/20 1804          OTHER Significant initial  Findings:  labs showing:    Recent Labs  Lab 08/25/20 1419  NA 136  K 3.8  CO2 25  GLUCOSE 92  BUN 8  CREATININE 0.69  CALCIUM 9.0  MG 2.0    Cr stable,  Lab Results  Component Value Date   CREATININE 0.69 08/25/2020   CREATININE 0.87 08/06/2020   CREATININE 0.72 07/29/2020      Plt: Lab Results  Component Value Date   PLT 307 08/25/2020    Recent Labs  Lab 08/25/20 1419  WBC 7.6  NEUTROABS 4.0  HGB 9.1*  HCT 32.2*  MCV 70.9*  PLT 307     HG/HCT  stable,      Component Value Date/Time   HGB  9.1 (L) 08/25/2020 1419   HGB 10.6 (L) 09/07/2019 1159   HCT 32.2 (L) 08/25/2020 1419   HCT 32.6 (L) 08/06/2020 1517   MCV 70.9 (L) 08/25/2020 1419   MCV 82 09/07/2019 1159   BNP (last 3 results) Recent Labs    11/27/19 0737 12/05/19 1145 08/25/20 1419  BNP 806.2* 84.3 301.6*           Cultures:    Component Value Date/Time   SDES  02/15/2020 1831    URINE, CLEAN CATCH Performed at [facility], [place] [address], [place], [place] [address]    SPECREQUEST  02/15/2020 1831    NONE Performed at [facility], [place] [address] [place], [place] [address]    CULT MULTIPLE SPECIES PRESENT, SUGGEST RECOLLECTION (A) 02/15/2020 1831   REPTSTATUS 02/17/2020 FINAL 02/15/2020 1831     Radiological Exams on Admission: DG Chest Portable 1 View  Result Date: 08/25/2020 CLINICAL DATA:  Chest pain EXAM: PORTABLE CHEST 1 VIEW COMPARISON:  November 27, 2019. FINDINGS: Enlarged cardiac silhouette. Left subclavian approach single lead cardiac rhythm maintenance device in similar position. No consolidation. No visible pleural effusions or pneumothorax. No evidence of acute osseous abnormality. IMPRESSION: 1. No acute cardiopulmonary disease. 2. Cardiomegaly. Electronically Signed   By: Margaretha Sheffield MD   On: 08/25/2020 15:46   _______________________________________________________________________________________________________ Latest  Blood pressure (!) 152/112, pulse (!) 102, temperature 98.6 F (37 C), temperature source Oral, resp. rate (!) 25, last menstrual period 07/26/2020, SpO2 100 %.   Review of Systems:    Pertinent positives include:  Fatigue,  chest pain, shortness of breath at rest. dyspnea on exertion Constitutional:  No weight loss, night sweats, Fevers, chills,  weight loss  HEENT:  No headaches, Difficulty swallowing,Tooth/dental problems,Sore throat,  No sneezing, itching, ear  ache, nasal congestion, post nasal drip,  Cardio-vascular:  No Orthopnea, PND, anasarca, dizziness, palpitations.no Bilateral lower extremity swelling  GI:  No heartburn, indigestion, abdominal pain, nausea, vomiting, diarrhea, change in bowel habits, loss of appetite, melena, blood in stool, hematemesis Resp:    No excess mucus, no productive cough, No non-productive cough, No coughing up of blood.No change in color of mucus.No wheezing. Skin:  no rash or lesions. No jaundice GU:  no dysuria, change in color of urine, no urgency or frequency. No straining to urinate.  No flank pain.  Musculoskeletal:  No joint pain or no joint swelling. No decreased range of motion. No back pain.  Psych:  No change in mood or affect. No depression or anxiety. No memory loss.  Neuro: no localizing neurological complaints, no tingling, no weakness, no double vision, no gait abnormality, no slurred speech, no confusion  All systems reviewed and apart from [place] all are negative _______________________________________________________________________________________________ Past Medical History:   Past Medical History:  Diagnosis Date  . Acute on chronic systolic (congestive) heart failure ([place]) 04/29/2017  . Acute pain of right shoulder 06/16/2017  . AICD (automatic cardioverter/defibrillator) present   . Anxiety   . Arthritis    right shoulder   . Asthma   . CHF (congestive heart failure) ([place])   . Chronic combined systolic and diastolic heart failure ([place]) 03/05/2014  . Closed low lateral malleolus fracture 10/23/2013  . Cystitis 10/21/2017  . Depression   . Depression with anxiety 01/20/2013  . Diabetes mellitus without complication ([place])   . Diverticulosis   . Dyspnea    comes and goes intermittently mostly with exertion   . Essential hypertension  Prev followed by [facility]/ Cardiology   . Fibroid    age 60  . Gallstones   . Generalized abdominal cramping   . History of cardiomyopathy   .  Hypertension   . IBS (irritable bowel syndrome)   . 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 Insomnia 05/07/2017  . Labral tear of shoulder 04/04/2015    Injected 04/04/2015 Injected 12/03/2015   . Migraine    "monthly" (08/03/2016)  . Myofascial pain 06/16/2017  . NICM (nonischemic cardiomyopathy) ([place]) 08/03/2016  . Nonallopathic lesion of lumbosacral region 11/16/2016  . Nonallopathic lesion of sacral region 11/16/2016  . Nonallopathic lesion of thoracic region 08/20/2014  . Nonspecific chest pain 04/28/2017  . OSA (obstructive sleep apnea) 01/02/2013   NPSG 2009:  AHI 9/hr. CPAP intolerance >> "smothering" Good tolerance of auto device (optimal pressure 12-13 on download).  - referred to Dr Gwenette Greet    . OSA on CPAP   . Ovarian cyst    1999; surgically removed  . Patellofemoral syndrome of both knees 10/16/2016  . 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 PVC (premature ventricular contraction) 06/23/2016  . Seizures ([place])    "as a child" (08/03/2016)  . Termination of pregnancy    due to cardiac risk      Past Surgical History:  Procedure Laterality Date  . CARDIAC CATHETERIZATION N/A 11/11/2015   Procedure: Right/Left Heart Cath and Coronary Angiography;  Surgeon: Troy Sine, MD;  Location: [facility] LAB;  Service: Cardiovascular;  Laterality: N/A;  . CARDIAC CATHETERIZATION  ~ 2015  . CARDIAC DEFIBRILLATOR PLACEMENT  08/03/2016  . CESAREAN SECTION  1999  . COLONOSCOPY WITH PROPOFOL N/A 04/21/2016   Procedure: COLONOSCOPY WITH PROPOFOL;  Surgeon: Jerene Bears, MD;  Location: [facility] ENDOS[facility];  Service: Gastroenterology;  Laterality: N/A;  . ESOPHAGOGASTRODUODENOSCOPY (EGD) WITH PROPOFOL N/A 04/21/2016   Procedure: ESOPHAGOGASTRODUODENOSCOPY (EGD) WITH PROPOFOL;  Surgeon: Jerene Bears, MD;  Location: [facility];  Service: Gastroenterology;  Laterality: N/A;  . FOOT FRACTURE SURGERY Right ~ 2003  . FRACTURE SURGERY    . ICD  IMPLANT N/A 08/03/2016   Procedure: ICD Implant;  Surgeon: Deboraha Sprang, MD;  Location: [facility] LAB;  Service: Cardiovascular;  Laterality: N/A;  . LAPAROSCOPIC CHOLECYSTECTOMY  12/2006  . LAPAROSCOPIC GASTRIC SLEEVE RESECTION    . LAPAROSCOPY ABDOMEN DIAGNOSTIC  2008   "cut bile duct w/gallbladder OR; had to go in later & fix leak; hospitalized for 2 months"  . LEFT HEART CATHETERIZATION WITH CORONARY ANGIOGRAM N/A 02/26/2014   Procedure: LEFT HEART CATHETERIZATION WITH CORONARY ANGIOGRAM;  Surgeon: Jettie Booze, MD;  Location: [facility] CATH LAB;  Service: Cardiovascular;  Laterality: N/A;  . OVARIAN CYST REMOVAL Right 1999  . PVC ABLATION N/A 12/01/2017   Procedure: PVC ABLATION;  Surgeon: Evans Lance, MD;  Location: [place] CV LAB;  Service: Cardiovascular;  Laterality: N/A;  . RIGHT HEART CATH N/A 08/05/2017   Procedure: RIGHT HEART CATH;  Surgeon: Jolaine Artist, MD;  Location: [facility] CV LAB;  Service: Cardiovascular;  Laterality: N/A;  . RIGHT HEART CATH N/A 11/16/2019   Procedure: RIGHT HEART CATH;  Surgeon: Jolaine Artist, MD;  Location: [place] CV LAB;  Service: Cardiovascular;  Laterality: N/A;  . TUBAL LIGATION  1999    Social History:  Ambulatory   independently       reports that she has never smoked. She has never used smokeless tobacco. She  reports that she does not drink alcohol and does not use drugs.     Family History:   Family History  Problem Relation Age of Onset  . Emphysema Maternal Grandmother        smoked  . Heart disease Maternal Grandmother 55       MI  . Rheum arthritis Mother   . Allergies Daughter   . Colon cancer Neg Hx    ______________________________________________________________________________________________ Allergies: Allergies  Allergen Reactions  . Vancomycin Other (See Comments)    "did something to my kidneys," PROGRESSED TO KIDNEY FAILURE!!  Marland Kitchen Aspirin Other (See Comments)    Wheezing, (Pt  states that she just wheezes some when she takes aspirin by itself but she can take aspirin in a combination product).  . Contrast Media [Iodinated Diagnostic Agents] Other (See Comments)    Multiple CT contrast studies done over 2 weeks caused ARF  . Avocado Itching and Swelling  . Ciprofloxacin Itching and Rash  . Farxiga [Dapagliflozin] Rash  . Sulfa Antibiotics Itching and Rash     Prior to Admission medications   Medication Sig Start Date End Date Taking? Authorizing Provider  amiodarone (PACERONE) 200 MG tablet Take 1 tablet (200 mg total) by mouth daily. 05/30/20  Yes Bensimhon, Shaune Pascal, MD  budesonide-formoterol [facility]) 160-4.5 MCG/ACT inhaler Inhale 2 puffs into the lungs 2 (two) times daily as needed. 05/30/20  Yes Hoyt Koch, MD  carvedilol (COREG) 12.5 MG tablet Take 1.5 tablets (18.75 mg total) by mouth 2 (two) times daily. 05/30/20 06/29/20 Yes Bensimhon, Shaune Pascal, MD  cyanocobalamin (,VITAMIN B-12,) 1000 MCG/ML injection Inject 1 mL (1,000 mcg total) into the muscle as directed. Once daily for 7 days, then once weekly for 4 weeks followed by once monthly 08/07/20  Yes Iruku, Arletha Pili, MD  diclofenac (VOLTAREN) 75 MG EC tablet Take 1 tablet (75 mg total) by mouth 2 (two) times daily. 06/18/20  Yes Hoyt Koch, MD  diclofenac Sodium (VOLTAREN) 1 % GEL Apply 4 g topically 4 (four) times daily. 05/30/20  Yes Hoyt Koch, MD  dicyclomine (BENTYL) 20 MG tablet Take 1 tablet (20 mg total) by mouth daily as needed for spasms. 05/30/20  Yes Hoyt Koch, MD  fluticasone [facility]) 50 MCG/ACT nasal spray Place 2 sprays into both nostrils daily as needed for allergies. 07/25/18  Yes Hoyt Koch, MD  gabapentin (NEURONTIN) 300 MG capsule Take 1 capsule (300 mg total) by mouth at bedtime as needed (back spasms). 05/30/20  Yes Hoyt Koch, MD  ipratropium-albuterol (DUONEB) 0.5-2.5 (3) MG/3ML SOLN Take 3 mLs by nebulization every 6 (six) hours as  needed. 11/17/19 12/17/19 Yes Dahal, Marlowe Aschoff, MD  ivabradine (CORLANOR) 7.5 MG TABS tablet Take 1 tablet (7.5 mg total) by mouth 2 (two) times daily with a meal. 05/30/20  Yes Bensimhon, Shaune Pascal, MD  lidocaine (LIDODERM) 5 % Place 1 patch onto the skin daily. Remove & Discard patch within 12 hours or as directed by MD 05/30/20  Yes Hoyt Koch, MD  Magnesium Oxide 200 MG TABS Take 1 tablet (200 mg total) by mouth daily. 05/30/20  Yes Bensimhon, Shaune Pascal, MD  montelukast (SINGULAIR) 10 MG tablet Take 1 tablet (10 mg total) by mouth at bedtime. 09/13/19  Yes Fenton Foy, NP  Multiple Vitamins-Minerals (MULTIVITAMIN GUMMIES WOMENS PO) Take 2 tablets by mouth at bedtime.    Yes [provider]  nitroGLYCERIN (NITROSTAT) 0.4 MG SL tablet Place 1 tablet (0.4 mg total) under  the tongue every 5 (five) minutes as needed for chest pain. 05/30/20  Yes Bensimhon, Shaune Pascal, MD  omeprazole (PRILOSEC) 20 MG capsule Take 1 capsule (20 mg total) by mouth daily as needed (for reflux symptoms). 06/18/20 09/13/22 Yes Hoyt Koch, MD  sacubitril-valsartan (ENTRESTO) 97-103 MG Take 1 tablet by mouth 2 (two) times daily. 05/30/20  Yes Bensimhon, Shaune Pascal, MD  tiZANidine (ZANAFLEX) 4 MG tablet Take 4 mg by mouth every 8 (eight) hours as needed for muscle spasms.  12/02/18  Yes [provider]  torsemide (DEMADEX) 20 MG tablet Take 1 tablet (20 mg total) by mouth daily. 05/30/20 06/29/20 Yes Bensimhon, Shaune Pascal, MD  Vitamin D, Ergocalciferol, (DRISDOL) 1.25 MG (50000 UNIT) CAPS capsule Take 1 capsule (50,000 Units total) by mouth every 7 (seven) days. 05/30/20  Yes Hoyt Koch, MD  albuterol [facility] HFA) 108 [phone] Base) MCG/ACT inhaler Inhale 1-2 puffs into the lungs every 6 (six) hours as needed for wheezing or shortness of breath. Patient not taking: Reported on 08/25/2020 08/17/19   Biagio Borg, MD  butalbital-acetaminophen-caffeine (FIORICET) [phone] MG tablet Take 1-2 tablets by mouth every 6  (six) hours as needed for headache. Patient not taking: Reported on 08/25/2020    [provider]  escitalopram (LEXAPRO) 10 MG tablet Take 1 tablet (10 mg total) by mouth daily. Patient not taking: Reported on 08/25/2020 11/30/19   Hoyt Koch, MD  Ibuprofen-Famotidine (DUEXIS) 800-26.6 MG TABS Take 1 tablet by mouth 2 (two) times daily as needed. Patient not taking: Reported on 08/25/2020 06/18/20   Hoyt Koch, MD  Ubrogepant (UBRELVY) 50 MG TABS Take 50 mg by mouth every 2 (two) hours as needed (migraine). Patient not taking: Reported on 08/25/2020 10/17/19   Hoyt Koch, MD    ___________________________________________________________________________________________________ Physical Exam: Vitals with BMI 08/25/2020 08/25/2020 08/25/2020  Height - - -  Weight - - -  BMI - - -  Systolic 417 408 144  Diastolic 818 79 84  Pulse 102 47 97     1. General:  in No Acute distress    Chronically ill -appearing 2. Psychological: Alert and  Oriented 3. Head/ENT:   Moist Mucous Membranes                          Head Non traumatic, neck supple                           Poor Dentition 4. SKIN:  decreased Skin turgor,  Skin clean Dry and intact no rash 5. Heart: Regular rate and rhythm no Murmur, no Rub or gallop 6. Lungs:  bilaterally, no wheezes or crackles   7. Abdomen: Soft,  non-tender, Non distended bowel sounds present 8. Lower extremities: no clubbing, cyanosis, no edema 9. Neurologically Grossly intact, moving all 4 extremities equally   10. MSK: Normal range of motion    Chart has been reviewed  ______________________________________________________________________________________________  Assessment/Plan  45 y.o. 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 Acute on chronic systolic (congestive) heart failure ([place]) -  - admit on telemetry,  cycle cardiac enzymes, Troponin 5-6   obtain serial ECG  to evaluate for ischemia as  a cause of heart failure  monitor daily weight:  Filed Weights   08/25/20 2025  Weight: 104.9 kg   Last BNP BNP (last 3 results) Recent Labs    11/27/19 0737 12/05/19 1145 08/25/20 1419  BNP 806.2* 84.3 301.6*    ProBNP (last 3 results) No results for input(s): PROBNP in the last 8760 hours.   diurese with IV lasix and monitor orthostatics and creatinine to avoid over diuresis.  Order echogram to evaluate EF and valves Entresto  ordered    cardiology consulted   . Postpartum cardiomyopathy -latest echogram showed EF less than 20% status post ICD placement.  Appreciate cardiology input  *After getting a dose of Lasix with great urine out put of >1000 amiodarone and COREG BP dropped Hold BP meds and lasix and given gentle bolus    . OSA (obstructive sleep apnea) -CPAP ordered  . Nonspecific chest pain -patient with nonsustained V. tach feels palpitations and describes as discomfort they fleeting but have been bothering her for the past few days.  Resume home medications Troponin unremarkable continue to monitor  . Essential hypertension -resume home medication  . Asthma -stable continue home medication  Diet-controlled diabetes mellitus -order sliding scale   Other plan as per orders.  DVT prophylaxis:    Lovenox       Code Status:    Code Status: Prior FULL CODE   as per patient   I had personally discussed CODE STATUS with patient      Family Communication:   Family not at  Bedside    Disposition Plan:    To home once workup is complete and patient is stable   Following barriers for discharge:                           Diuresed                                                           Will need consultants to evaluate patient prior to discharge                     would benefit from PT/OT eval prior to DC  Ordered                   Consults called: Cardiology consulted   Admission status:  ED Disposition    ED Disposition Condition [place]: [place] [100100]  Level of Care: Progressive [102]  Admit to Progressive based on following criteria: CARDIOVASCULAR & THORACIC of moderate stability with acute coronary syndrome symptoms/low risk myocardial infarction/hypertensive urgency/arrhythmias/heart failure potentially compromising stability and stable post cardiovascular intervention patients.  May admit patient to [facility] or [facility] if equivalent level of care is available:: No  Covid Evaluation: Asymptomatic Screening Protocol (No Symptoms)  Diagnosis: Acute on chronic systolic (congestive) heart failure [facility]) [[id number]]  Admitting Physician: Toy Baker [[id number]]  Attending Physician: Toy Baker [[id number]]  Estimated length of stay: past midnight tomorrow  Certification:: I certify this patient will need inpatient services for at least 2 midnights        inpatient     I Expect 2 midnight stay secondary to severity of patient's current illness need for inpatient  interventions justified by the following:  hemodynamic instability despite optimal treatment (tachycardia )  Severe lab/radiological/exam abnormalities including:    frequent Ectopy and extensive comorbidities including:    DM2    CHF     COPD/asthma   Obesity .    That are currently affecting medical management.   I expect  patient to be hospitalized for 2 midnights requiring inpatient medical care.  Patient is at high risk for adverse outcome (such as loss of life or disability) if not treated.  Indication for inpatient stay as follows:     persistent chest pain despite medical management   Need for IV diuresis    Level of care   progressive tele indefinitely please discontinue once patient no  longer qualifies COVID-19 Labs      Precautions: admitted as asymptomatic screening protocol    PPE: Used by the provider:   N95  eye Goggles,  Gloves    Slater Mcmanaman 08/25/2020, 9:33 PM    [facility]     after 2 AM please page floor coverage PA If 7AM-7PM, please contact the day team taking care of the patient using [email or web address]   Patient was evaluated in the context of the global COVID-19 pandemic, which necessitated consideration that the patient might be at risk for infection with the SARS-CoV-2 virus that causes COVID-19. Institutional protocols and algorithms that pertain to the evaluation of patients at risk for COVID-19 are in a state of rapid change based on information released by regulatory bodies including the CDC and federal and state organizations. These policies and algorithms were followed during the patient's care.

## 2020-08-25 NOTE — ED Notes (Signed)
Pt's spouse brought a [employer] meal.

## 2020-08-26 ENCOUNTER — Other Ambulatory Visit: Payer: Self-pay

## 2020-08-26 ENCOUNTER — Inpatient Hospital Stay: Payer: HMO

## 2020-08-26 ENCOUNTER — Encounter (HOSPITAL_COMMUNITY): Payer: Self-pay | Admitting: Internal Medicine

## 2020-08-26 ENCOUNTER — Inpatient Hospital Stay (HOSPITAL_COMMUNITY): Payer: HMO

## 2020-08-26 DIAGNOSIS — I5023 Acute on chronic systolic (congestive) heart failure: Secondary | ICD-10-CM

## 2020-08-26 DIAGNOSIS — I493 Ventricular premature depolarization: Secondary | ICD-10-CM | POA: Diagnosis not present

## 2020-08-26 DIAGNOSIS — R079 Chest pain, unspecified: Secondary | ICD-10-CM

## 2020-08-26 DIAGNOSIS — I5021 Acute systolic (congestive) heart failure: Secondary | ICD-10-CM | POA: Diagnosis not present

## 2020-08-26 DIAGNOSIS — E1100 Type 2 diabetes mellitus with hyperosmolarity without nonketotic hyperglycemic-hyperosmolar coma (NKHHC): Secondary | ICD-10-CM

## 2020-08-26 LAB — GLUCOSE, CAPILLARY
Glucose-Capillary: 111 mg/dL — ABNORMAL HIGH (ref 70–99)
Glucose-Capillary: 81 mg/dL (ref 70–99)
Glucose-Capillary: 86 mg/dL (ref 70–99)
Glucose-Capillary: 98 mg/dL (ref 70–99)

## 2020-08-26 LAB — HEMOGLOBIN A1C
Hgb A1c MFr Bld: 5.5 % (ref 4.8–5.6)
Mean Plasma Glucose: 111.15 mg/dL

## 2020-08-26 LAB — CBC WITH DIFFERENTIAL/PLATELET
Abs Immature Granulocytes: 0.03 10*3/uL (ref 0.00–0.07)
Basophils Absolute: 0 10*3/uL (ref 0.0–0.1)
Basophils Relative: 0 %
Eosinophils Absolute: 0.1 10*3/uL (ref 0.0–0.5)
Eosinophils Relative: 1 %
HCT: 35.6 % — ABNORMAL LOW (ref 36.0–46.0)
Hemoglobin: 10.1 g/dL — ABNORMAL LOW (ref 12.0–15.0)
Immature Granulocytes: 0 %
Lymphocytes Relative: 21 %
Lymphs Abs: 2 10*3/uL (ref 0.7–4.0)
MCH: 20.3 pg — ABNORMAL LOW (ref 26.0–34.0)
MCHC: 28.4 g/dL — ABNORMAL LOW (ref 30.0–36.0)
MCV: 71.5 fL — ABNORMAL LOW (ref 80.0–100.0)
Monocytes Absolute: 0.7 10*3/uL (ref 0.1–1.0)
Monocytes Relative: 7 %
Neutro Abs: 6.9 10*3/uL (ref 1.7–7.7)
Neutrophils Relative %: 71 %
Platelets: 307 10*3/uL (ref 150–400)
RBC: 4.98 MIL/uL (ref 3.87–5.11)
RDW: 18.6 % — ABNORMAL HIGH (ref 11.5–15.5)
WBC: 9.7 10*3/uL (ref 4.0–10.5)
nRBC: 0 % (ref 0.0–0.2)

## 2020-08-26 LAB — COMPREHENSIVE METABOLIC PANEL
ALT: 15 U/L (ref 0–44)
AST: 22 U/L (ref 15–41)
Albumin: 3.8 g/dL (ref 3.5–5.0)
Alkaline Phosphatase: 92 U/L (ref 38–126)
Anion gap: 9 (ref 5–15)
BUN: 11 mg/dL (ref 6–20)
CO2: 26 mmol/L (ref 22–32)
Calcium: 9.4 mg/dL (ref 8.9–10.3)
Chloride: 101 mmol/L (ref 98–111)
Creatinine, Ser: 0.89 mg/dL (ref 0.44–1.00)
GFR, Estimated: >60 mL/min (ref >60)
Glucose, Bld: 104 mg/dL — ABNORMAL HIGH (ref 70–99)
Potassium: 4.7 mmol/L (ref 3.5–5.1)
Sodium: 136 mmol/L (ref 135–145)
Total Bilirubin: 0.4 mg/dL (ref 0.3–1.2)
Total Protein: 7.6 g/dL (ref 6.5–8.1)

## 2020-08-26 LAB — MAGNESIUM: Magnesium: 2.1 mg/dL (ref 1.7–2.4)

## 2020-08-26 LAB — CBG MONITORING, ED: Glucose-Capillary: 123 mg/dL — ABNORMAL HIGH (ref 70–99)

## 2020-08-26 LAB — ECHOCARDIOGRAM COMPLETE
Area-P 1/2: 4.31 cm2
Height: 65 in
S' Lateral: 4.9 cm
Single Plane A4C EF: 34.4 %
Weight: 3587.33 oz

## 2020-08-26 LAB — BILIRUBIN, DIRECT: Bilirubin, Direct: <0.1 mg/dL (ref 0.0–0.2)

## 2020-08-26 LAB — SARS CORONAVIRUS 2 (TAT 6-24 HRS): SARS Coronavirus 2: NEGATIVE

## 2020-08-26 LAB — TSH: TSH: 1.732 u[IU]/mL (ref 0.350–4.500)

## 2020-08-26 MED ORDER — ALBUTEROL SULFATE HFA 108 (90 BASE) MCG/ACT IN AERS: 1.0000 | INHALATION_SPRAY | Freq: Four times a day (QID) | RESPIRATORY_TRACT | 5 refills | Status: DC | PRN

## 2020-08-26 MED ORDER — METOPROLOL SUCCINATE ER 50 MG PO TB24
50.0000 mg | ORAL_TABLET | Freq: Every day | ORAL | Status: DC
  Administered 2020-08-27: 50 mg via ORAL
  Filled 2020-08-26: qty 1

## 2020-08-26 MED ORDER — AMIODARONE HCL 200 MG PO TABS
200.0000 mg | ORAL_TABLET | Freq: Two times a day (BID) | ORAL | Status: DC
  Administered 2020-08-26 – 2020-08-27 (×2): 200 mg via ORAL
  Filled 2020-08-26 (×2): qty 1

## 2020-08-26 MED ORDER — METOPROLOL SUCCINATE ER 50 MG PO TB24: 50.0000 mg | ORAL_TABLET | Freq: Every day | ORAL | Status: DC

## 2020-08-26 NOTE — Progress Notes (Signed)
Offered patient CPAP she states she is fine and dont need to wear it.  Patient no longer wears CPAP at home.

## 2020-08-26 NOTE — ED Notes (Signed)
Pt given a sleep mask and ear plugs.

## 2020-08-26 NOTE — Progress Notes (Signed)
  Echocardiogram 2D Echocardiogram has been performed.  [name] 08/26/2020, 11:39 AM

## 2020-08-26 NOTE — Progress Notes (Addendum)
Progress Note  Patient Name: [name] Date of Encounter: 08/26/2020  [facility] Cardiologist: Sinclair Grooms, MD  / Haroldine Laws / [place]   Subjective   45 year old female with a history of a nonischemic cardiomyopathy.  She has frequent premature ventricular contractions. She has had heart catheterization that revealed normal coronary arteries.  She has been seen in the heart failure clinic in the past.  She is admitted with some chest pain and worsening shortness of breath.  Was having lots of PVCs on admission. This has improved significantly . K was 3.8 , now is 4.7 Pain is better    Inpatient Medications    Scheduled Meds: . enoxaparin (LOVENOX) injection  40 mg Subcutaneous Q24H  . insulin aspart  0-9 Units Subcutaneous Q4H  . ivabradine  7.5 mg Oral BID WC  . mometasone-formoterol  2 puff Inhalation BID  . montelukast  10 mg Oral QHS  . sodium chloride flush  3 mL Intravenous Q12H   Continuous Infusions: . sodium chloride    . amiodarone     PRN Meds: sodium chloride, acetaminophen, gabapentin, ondansetron (ZOFRAN) IV, sodium chloride flush   Vital Signs    Vitals:   08/26/20 0715 08/26/20 0725 08/26/20 0743 08/26/20 0746  BP: 98/73   103/73  Pulse: 70   75  Resp: 18   18  Temp:  97.9 F (36.6 C)  98 F (36.7 C)  TempSrc:  Oral  Oral  SpO2: 95%   100%  Weight:   101.7 kg   Height:   5\' 5"  (1.651 m)     Intake/Output Summary (Last 24 hours) at 08/26/2020 0917 Last data filed at 08/25/2020 2148 Gross per 24 hour  Intake --  Output 1400 ml  Net -1400 ml   Last 3 Weights 08/26/2020 08/25/2020 08/06/2020  Weight (lbs) 224 lb 3.3 oz 231 lb 4.2 oz 225 lb 11.2 oz  Weight (kg) 101.7 kg 104.9 kg 102.377 kg      Telemetry    NSR  - Personally Reviewed  ECG     - Personally Reviewed  Physical Exam   GEN: No acute distress.   Moderately obese.  Neck: No JVD Cardiac: RRR, no murmurs, rubs, or gallops.  Respiratory: Clear to auscultation  bilaterally. GI: Soft, nontender, non-distended  MS: No edema; No deformity. Neuro:  Nonfocal  Psych: Normal affect   Labs    High Sensitivity Troponin:   Recent Labs  Lab 08/25/20 1419 08/25/20 1610  TROPONINIHS 5 6      Chemistry Recent Labs  Lab 08/25/20 1419 08/26/20 0201  NA 136 136  K 3.8 4.7  CL 106 101  CO2 25 26  GLUCOSE 92 104*  BUN 8 11  CREATININE 0.69 0.89  CALCIUM 9.0 9.4  PROT  --  7.6  ALBUMIN  --  3.8  AST  --  22  ALT  --  15  ALKPHOS  --  92  BILITOT  --  0.4  GFRNONAA >60 >60  ANIONGAP 5 9     Hematology Recent Labs  Lab 08/25/20 1419 08/26/20 0201  WBC 7.6 9.7  RBC 4.54 4.98  HGB 9.1* 10.1*  HCT 32.2* 35.6*  MCV 70.9* 71.5*  MCH 20.0* 20.3*  MCHC 28.3* 28.4*  RDW 17.7* 18.6*  PLT 307 307    BNP Recent Labs  Lab 08/25/20 1419  BNP 301.6*     DDimer No results for input(s): DDIMER in the last 168 hours.  Radiology    DG Chest Portable 1 View  Result Date: 08/25/2020 CLINICAL DATA:  Chest pain EXAM: PORTABLE CHEST 1 VIEW COMPARISON:  November 27, 2019. FINDINGS: Enlarged cardiac silhouette. Left subclavian approach single lead cardiac rhythm maintenance device in similar position. No consolidation. No visible pleural effusions or pneumothorax. No evidence of acute osseous abnormality. IMPRESSION: 1. No acute cardiopulmonary disease. 2. Cardiomegaly. Electronically Signed   By: Margaretha Sheffield MD   On: 08/25/2020 15:46    Cardiac Studies      Patient Profile     45 y.o. female with know chronic combined CHF, PVCs   Assessment & Plan    1. CHF :   Seems stable.  Getting an echo.   BP is low.  entresto is currently on hold.  Would resume after BP increases She received IV lasix last night and became hypotensive .   I would hold further diuretics today . Her last [facility] HF index was 0 ( Feb. 28, 2022)  suggesting normal volume    2.  PVCs  :  Was having frequent PVCs.   Better after IV amio. Will ask EP to comment  if she just needs a higher dose of amiodarone or another med    For questions or updates, please contact [place] Please consult [email or web address] for contact info under        Signed, Mertie Moores, MD  08/26/2020, 9:17 AM     Addendum:  EP / Dr. Lovena Le has reviewed the chart Will increase the amiodarone to 200 mg po BID Change the coreg to Toprol XL. Will start toprol XL 50 mg QD tomorrow.       Mertie Moores, MD  08/26/2020 3:34 PM    [facility] [place],  [place], [place]  [address] Phone: [phone]; Fax: [phone]

## 2020-08-26 NOTE — Progress Notes (Signed)
[facility]                                                                              Patient Demographics  [name], is a 45 y.o. female, DOB - 10/07/1975, GPQ:[id number]  Admit date - 08/25/2020   Admitting Physician Megan Baker, MD  Outpatient Primary MD for the patient is Megan Koch, MD  Outpatient specialists:   LOS - 1  days   Medical records reviewed and are as summarized below:    Chief Complaint  Patient presents with  . 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  No fevers or chills, no leg edema.  No syncope and her ICD did not fire. AICD was interrogated in ED.  Patient also received a dose of Lasix and cardiology was consulted.  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  Patient had received diuretic in ED, held further diuresis. -Continue to hold diuretics, patient was given additional 250 cc normal saline to hydrate due to dropping BP. -Cardiology following, continue ivabradine -Repeat 2D echo showed EF of 20 to 25%, global hypokinesis, normal right ventricular systolic function  Frequent PVCs, with history of VT, AICD -EP cardiology consulted, will follow recommendations -Placed on IV amiodarone drip after loading dose    Essential hypertension -BP currently soft, hold Lasix and Coreg    OSA (obstructive sleep apnea) -Continue CPAP qhs    DM (diabetes mellitus), type 2 ([place]), diet controlled -Hemoglobin A1c 5.5.  CBGs well-controlled     Asthma -Currently stable, no wheezing  Obesity Estimated body mass index is 37.31 kg/m as calculated from the following:   Height as of this encounter: 5\' 5"  (1.651 m).   Weight as of this encounter: 101.7 kg.  Code Status: Full CODE STATUS DVT Prophylaxis:  enoxaparin (LOVENOX) injection 40 mg Start: 08/25/20 2100   Level of Care: Level of care: Progressive Family Communication: Discussed all imaging results, lab results, explained to the patient    Disposition Plan:     Status is: Inpatient  Remains inpatient appropriate because:Inpatient level of care appropriate due to severity of illness   Dispo: The patient is from: Home              Anticipated d/c is to: Home              Patient currently is not medically stable to d/c.  On IV amiodarone drip   Difficult to place patient No      Time Spent in minutes   35 minutes Procedures:  2D echo  Consultants:   Cardiology  Antimicrobials:   Anti-infectives (From admission, onward)   None          Medications  Scheduled Meds: . enoxaparin (LOVENOX) injection  40 mg Subcutaneous Q24H  . insulin aspart  0-9 Units Subcutaneous Q4H  . ivabradine  7.5 mg Oral BID WC  . mometasone-formoterol  2 puff Inhalation BID  . montelukast  10 mg Oral QHS  . sodium chloride flush  3 mL Intravenous Q12H   Continuous Infusions: . sodium chloride    . amiodarone 30 mg/hr (08/26/20 0820)   PRN Meds:.sodium chloride, acetaminophen, gabapentin, ondansetron (ZOFRAN) IV, sodium chloride flush      Subjective:   [name] was seen and examined today.  Currently no chest pain, starting to feel somewhat better today. Patient denies dizziness, abdominal pain, N/V/D/C, new weakness, numbess, tingling. No acute events overnight.    Objective:   Vitals:   08/26/20 0725 08/26/20 0743 08/26/20 0746 08/26/20 1300  BP:   103/73 (!) 88/64  Pulse:   75 86  Resp:   18 17  Temp: 97.9 F (36.6 C)  98 F (36.7 C) 98.2 F (36.8  C)  TempSrc: Oral  Oral Oral  SpO2:   100% 99%  Weight:  101.7 kg    Height:  5\' 5"  (1.651 m)      Intake/Output Summary (Last 24 hours) at 08/26/2020 1436 Last data filed at 08/25/2020 2148 Gross per 24 hour  Intake --  Output 1400 ml  Net -1400 ml     Wt Readings from Last 3 Encounters:  08/26/20 101.7 kg  08/06/20 102.4 kg  07/29/20 103.9 kg     Exam  General: Alert and oriented x 3, NAD  Cardiovascular: S1 S2 auscultated, no murmurs, RRR  Respiratory: Decreased breath sound at the bases  Gastrointestinal: Obese, soft, nontender, nondistended, + bowel sounds  Ext: no pedal edema bilaterally  Neuro: no new deficits  Musculoskeletal: No digital cyanosis, clubbing  Skin: No rashes  Psych: Normal affect and demeanor, alert and oriented x3    Data Reviewed:  I have personally reviewed following labs and imaging studies  Micro Results Recent Results (from the past 240 hour(s))  SARS CORONAVIRUS 2 (TAT 6-24 HRS) Nasopharyngeal Nasopharyngeal Swab     Status: None   Collection Time: 08/25/20  8:27 PM   Specimen: Nasopharyngeal Swab  Result Value Ref Range Status   SARS Coronavirus 2 NEGATIVE NEGATIVE Final    Comment: (NOTE) SARS-CoV-2 target nucleic acids are NOT DETECTED.  The SARS-CoV-2 RNA is generally detectable in upper and lower respiratory specimens during the acute phase of infection. Negative results do not preclude SARS-CoV-2 infection, do not rule out co-infections with other pathogens, and should not be used as the sole basis for treatment or other patient management decisions. Negative results must be combined with clinical observations, patient history, and epidemiological information. The expected result is Negative.  Fact Sheet for Patients: SugarRoll.be  Fact Sheet for Healthcare Providers: https://www.woods-mathews.com/  This test is not yet approved or cleared by the [place] FDA and  has  been authorized for detection and/or diagnosis of SARS-CoV-2 by FDA under an Emergency Use Authorization (EUA). This EUA will remain  in effect (meaning this test can be used) for the duration of the COVID-19 declaration under Se ction 564(b)(1) of the Act, 21 U.S.C. section 360bbb-3(b)(1), unless the authorization is terminated or revoked sooner.  Performed at [facility], [place] [address] [place], [place] [address]     Radiology Reports DG Chest Portable 1 View  Result Date: 08/25/2020 CLINICAL DATA:  Chest pain EXAM: PORTABLE CHEST 1 VIEW  COMPARISON:  November 27, 2019. FINDINGS: Enlarged cardiac silhouette. Left subclavian approach single lead cardiac rhythm maintenance device in similar position. No consolidation. No visible pleural effusions or pneumothorax. No evidence of acute osseous abnormality. IMPRESSION: 1. No acute cardiopulmonary disease. 2. Cardiomegaly. Electronically Signed   By: Margaretha Sheffield MD   On: 08/25/2020 15:46   ECHOCARDIOGRAM COMPLETE  Result Date: 08/26/2020    ECHOCARDIOGRAM REPORT   Patient Name:   [name] Date of Exam: 08/26/2020 Medical Rec #:  [id number]      Height:       65.0 in Accession #:    [id number]     Weight:       231.3 lb Date of Birth:  [birth year]      BSA:          2.104 m Patient Age:    65 years       BP:           103/73 mmHg Patient Gender: F              HR:           74 bpm. Exam Location:  Inpatient Procedure: 2D Echo Indications:    Acute systolic CHF  History:        Patient has prior history of Echocardiogram examinations, most                 recent 11/13/2019. Cardiomyopathy, Defibrillator; Risk                 Factors:Sleep Apnea, Hypertension and Diabetes.  Sonographer:    [name] Referring Phys: [place]  1. Left ventricular ejection fraction, by estimation, is 20 to 25%. The left ventricle has severely decreased function. The left ventricle demonstrates global hypokinesis. The left  ventricular internal cavity size was moderately dilated. Left ventricular diastolic parameters were normal.  2. AICD wires in RA/RV. Right ventricular systolic function is normal. The right ventricular size is normal. There is normal pulmonary artery systolic pressure.  3. Left atrial size was mildly dilated.  4. The mitral valve is abnormal. Mild mitral valve regurgitation. No evidence of mitral stenosis.  5. The aortic valve is tricuspid. Aortic valve regurgitation is not visualized. No aortic stenosis is present.  6. The inferior vena cava is normal in size with greater than 50% respiratory variability, suggesting right atrial pressure of 3 mmHg. FINDINGS  Left Ventricle: Left ventricular ejection fraction, by estimation, is 20 to 25%. The left ventricle has severely decreased function. The left ventricle demonstrates global hypokinesis. The left ventricular internal cavity size was moderately dilated. There is no left ventricular hypertrophy. Left ventricular diastolic parameters were normal. Right Ventricle: AICD wires in RA/RV. The right ventricular size is normal. No increase in right ventricular wall thickness. Right ventricular systolic function is normal. There is normal pulmonary artery systolic pressure. The tricuspid regurgitant velocity is 1.67 m/s, and with an assumed right atrial pressure of 3 mmHg, the estimated right ventricular systolic pressure is 17.6 mmHg. Left Atrium: Left atrial size was mildly dilated. Right Atrium: Right atrial size was normal in size. Pericardium: There is no evidence of pericardial effusion. Mitral Valve: The mitral valve is abnormal. There is mild thickening of the mitral valve leaflet(s). Mild mitral valve regurgitation. No evidence of mitral valve stenosis. Tricuspid Valve: The tricuspid valve is normal in structure. Tricuspid valve regurgitation is mild . No evidence of tricuspid stenosis. Aortic Valve: The aortic valve is tricuspid. Aortic  valve regurgitation is not  visualized. No aortic stenosis is present. Pulmonic Valve: The pulmonic valve was normal in structure. Pulmonic valve regurgitation is not visualized. No evidence of pulmonic stenosis. Aorta: The aortic root is normal in size and structure. Venous: The inferior vena cava is normal in size with greater than 50% respiratory variability, suggesting right atrial pressure of 3 mmHg. IAS/Shunts: No atrial level shunt detected by color flow Doppler. Additional Comments: A device lead is visualized.  LEFT VENTRICLE PLAX 2D LVIDd:         5.70 cm      Diastology LVIDs:         4.90 cm      LV e' medial:    7.01 cm/s LV PW:         0.90 cm      LV E/e' medial:  11.9 LV IVS:        0.90 cm      LV e' lateral:   9.46 cm/s LVOT diam:     2.20 cm      LV E/e' lateral: 8.8 LV SV:         70 LV SV Index:   33 LVOT Area:     3.80 cm  LV Volumes (MOD) LV vol d, MOD A4C: 127.0 ml LV vol s, MOD A4C: 83.3 ml LV SV MOD A4C:     127.0 ml RIGHT VENTRICLE             IVC RV S prime:     10.70 cm/s  IVC diam: 1.10 cm TAPSE (M-mode): 2.2 cm LEFT ATRIUM             Index       RIGHT ATRIUM           Index LA diam:        4.20 cm 2.00 cm/m  RA Area:     12.10 cm LA Vol (A2C):   60.3 ml 28.66 ml/m RA Volume:   26.60 ml  12.64 ml/m LA Vol (A4C):   77.4 ml 36.79 ml/m LA Biplane Vol: 74.0 ml 35.17 ml/m  AORTIC VALVE LVOT Vmax:   94.00 cm/s LVOT Vmean:  67.100 cm/s LVOT VTI:    0.183 m  AORTA Ao Asc diam: 3.20 cm MITRAL VALVE               TRICUSPID VALVE MV Area (PHT): 4.31 cm    TR Peak grad:   11.2 mmHg MV Decel Time: 176 msec    TR Vmax:        167.00 cm/s MV E velocity: 83.60 cm/s MV A velocity: 41.10 cm/s  SHUNTS MV E/A ratio:  2.03        Systemic VTI:  0.18 m                            Systemic Diam: 2.20 cm Jenkins Rouge MD Electronically signed by Jenkins Rouge MD Signature Date/Time: 08/26/2020/12:02:28 PM    Final     Lab Data:  CBC: Recent Labs  Lab 08/25/20 1419 08/26/20 0201  WBC 7.6 9.7  NEUTROABS 4.0 6.9  HGB 9.1*  10.1*  HCT 32.2* 35.6*  MCV 70.9* 71.5*  PLT 307 774   Basic Metabolic Panel: Recent Labs  Lab 08/25/20 1419 08/26/20 0201  NA 136 136  K 3.8 4.7  CL 106 101  CO2 25 26  GLUCOSE 92 104*  BUN 8 11  CREATININE 0.69 0.89  CALCIUM 9.0 9.4  MG 2.0 2.1   GFR: Estimated Creatinine Clearance: 93.4 mL/min (by C-G formula based on SCr of 0.89 mg/dL). Liver Function Tests: Recent Labs  Lab 08/26/20 0201  AST 22  ALT 15  ALKPHOS 92  BILITOT 0.4  PROT 7.6  ALBUMIN 3.8   No results for input(s): LIPASE, AMYLASE in the last 168 hours. No results for input(s): AMMONIA in the last 168 hours. Coagulation Profile: No results for input(s): INR, PROTIME in the last 168 hours. Cardiac Enzymes: No results for input(s): CKTOTAL, CKMB, CKMBINDEX, TROPONINI in the last 168 hours. BNP (last 3 results) No results for input(s): PROBNP in the last 8760 hours. HbA1C: Recent Labs    08/26/20 0201  HGBA1C 5.5   CBG: Recent Labs  Lab 08/25/20 2140 08/26/20 0348 08/26/20 1136  GLUCAP 74 123* 98   Lipid Profile: No results for input(s): CHOL, HDL, LDLCALC, TRIG, CHOLHDL, LDLDIRECT in the last 72 hours. Thyroid Function Tests: Recent Labs    08/26/20 0743  TSH 1.732   Anemia Panel: No results for input(s): VITAMINB12, FOLATE, FERRITIN, TIBC, IRON, RETICCTPCT in the last 72 hours. Urine analysis:    Component Value Date/Time   COLORURINE AMBER (A) 02/15/2020 1831   APPEARANCEUR CLOUDY (A) 02/15/2020 1831   LABSPEC 1.024 02/15/2020 1831   PHURINE 5.0 02/15/2020 1831   GLUCOSEU NEGATIVE 02/15/2020 1831   GLUCOSEU NEGATIVE 05/28/2016 1202   HGBUR SMALL (A) 02/15/2020 1831   BILIRUBINUR NEGATIVE 02/15/2020 1831   BILIRUBINUR Neg 02/10/2019 1608   KETONESUR 5 (A) 02/15/2020 1831   PROTEINUR >=300 (A) 02/15/2020 1831   UROBILINOGEN 0.2 02/10/2019 1608   UROBILINOGEN 0.2 05/28/2016 1202   NITRITE NEGATIVE 02/15/2020 1831   LEUKOCYTESUR TRACE (A) 02/15/2020 1831     Annalise Mcdiarmid M.D. [facility] 08/26/2020, 2:36 PM  Available via Epic secure chat 7am-7pm After 7 pm, please refer to night coverage provider listed on amion.

## 2020-08-26 NOTE — Progress Notes (Signed)
Pt declined CPAP for tonight and states she may try tomorrow

## 2020-08-26 NOTE — Progress Notes (Signed)
ICM remote transmission rescheduled for 09/09/2020 due to patient currently hospitalized.

## 2020-08-26 NOTE — ED Notes (Signed)
Pt assisted to bedside commode

## 2020-08-27 DIAGNOSIS — R079 Chest pain, unspecified: Secondary | ICD-10-CM | POA: Diagnosis not present

## 2020-08-27 DIAGNOSIS — I5023 Acute on chronic systolic (congestive) heart failure: Secondary | ICD-10-CM | POA: Diagnosis not present

## 2020-08-27 DIAGNOSIS — I493 Ventricular premature depolarization: Secondary | ICD-10-CM | POA: Diagnosis not present

## 2020-08-27 LAB — GLUCOSE, CAPILLARY
Glucose-Capillary: 83 mg/dL (ref 70–99)
Glucose-Capillary: 84 mg/dL (ref 70–99)
Glucose-Capillary: 87 mg/dL (ref 70–99)

## 2020-08-27 MED ORDER — TORSEMIDE 20 MG PO TABS: 20.0000 mg | ORAL_TABLET | Freq: Every day | ORAL | 2 refills | Status: DC

## 2020-08-27 MED ORDER — METOPROLOL SUCCINATE ER 50 MG PO TB24: 50.0000 mg | ORAL_TABLET | Freq: Every day | ORAL | 3 refills | Status: DC

## 2020-08-27 MED ORDER — DICLOFENAC SODIUM 75 MG PO TBEC
75.0000 mg | DELAYED_RELEASE_TABLET | Freq: Two times a day (BID) | ORAL | 0 refills | Status: DC | PRN
Start: 2020-08-27 — End: 2021-04-21

## 2020-08-27 MED ORDER — AMIODARONE HCL 200 MG PO TABS
200.0000 mg | ORAL_TABLET | Freq: Two times a day (BID) | ORAL | 2 refills | Status: DC
Start: 2020-08-27 — End: 2021-02-04

## 2020-08-27 MED ORDER — SACUBITRIL-VALSARTAN 97-103 MG PO TABS: 1.0000 | ORAL_TABLET | Freq: Two times a day (BID) | ORAL | 2 refills | Status: DC

## 2020-08-27 MED ORDER — EMPAGLIFLOZIN 10 MG PO TABS: 10.0000 mg | ORAL_TABLET | Freq: Every day | ORAL | 3 refills | Status: DC

## 2020-08-27 MED ORDER — COVID-19 MRNA VAC-TRIS(PFIZER) 30 MCG/0.3ML IM SUSP
0.3000 mL | Freq: Once | INTRAMUSCULAR | Status: AC
  Administered 2020-08-27: 0.3 mL via INTRAMUSCULAR
  Filled 2020-08-27: qty 0.3

## 2020-08-27 NOTE — Discharge Instructions (Signed)
Appt with HF clinic on 4/12 @ 10:30: parking code 2231  Heart Failure, Diagnosis  Heart failure means that your heart is not able to pump blood in the right way. This makes it hard for your body to work well. Heart failure is usually a long-term (chronic) condition. You must take good care of yourself and follow your treatment plan from your doctor. What are the causes?  High blood pressure.  Buildup of cholesterol and fat in the arteries.  Heart attack. This injures the heart muscle.  Heart valves that do not open and close properly.  Damage of the heart muscle. This is also called cardiomyopathy.  Infection of the heart muscle. This is also called myocarditis.  Lung disease. What increases the risk?  Getting older. The risk of heart failure goes up as a person ages.  Being overweight.  Being female.  Use tobacco or nicotine products.  Abusing alcohol or drugs.  Having taken medicines that can damage the heart.  Having any of these conditions: ? Diabetes. ? Abnormal heart rhythms. ? Thyroid problems. ? Low blood counts (anemia).  Having a family history of heart failure. What are the signs or symptoms?  Shortness of breath.  Coughing.  Swelling of the feet, ankles, legs, or belly.  Losing or gaining weight for no reason.  Trouble breathing.  Waking from sleep because of the need to sit up and get more air.  Fast heartbeat.  Being very tired.  Feeling dizzy, or feeling like you may pass out (faint).  Having no desire to eat.  Feeling like you may vomit (nauseous).  Peeing (urinating) more at night.  Feeling confused. How is this treated? This condition may be treated with:  Medicines. These can be given to treat blood pressure and to make the heart muscles stronger.  Changes in your daily life. These may include: ? Eating a healthy diet. ? Staying at a healthy body weight. ? Quitting tobacco, alcohol, and drug use. ? Doing  exercises. ? Participating in a cardiac rehabilitation program. This program helps you improve your health through exercise, education, and counseling.  Surgery. Surgery can be done to open blocked valves, or to put devices in the heart, such as pacemakers.  A donor heart (heart transplant). You will receive a healthy heart from a donor. Follow these instructions at home:  Treat other conditions as told by your doctor. These may include high blood pressure, diabetes, thyroid disease, or abnormal heart rhythms.  Learn as much as you can about heart failure.  Get support as you need it.  Keep all follow-up visits. Summary  Heart failure means that your heart is not able to pump blood in the right way.  This condition is often caused by high blood pressure, heart attack, or damage of the heart muscle.  Symptoms of this condition include shortness of breath and swelling of the feet, ankles, legs, or belly. You may also feel very tired or feel like you may vomit.  You may be treated with medicines, surgery, or changes in your daily life.  Treat other health conditions as told by your doctor. This information is not intended to replace advice given to you by your health care provider. Make sure you discuss any questions you have with your health care provider. Document Revised: 12/02/2019 Document Reviewed: 12/02/2019 Elsevier Patient Education  2021 [place].   Heart Failure, Self-Care Heart failure is a serious condition. The following information explains things you need to do to take  care of yourself at home. To help you stay as healthy as possible, you may be asked to change your diet, take certain medicines, and make other changes in your life. Your doctor may also give you more specific instructions. If you have problems or questions, call your doctor. What are the risks? Having heart failure makes it more likely for you to have some problems. These problems can get worse if  you do not take good care of yourself. Problems may include:  Damage to the kidneys, liver, or lungs.  Malnutrition.  Abnormal heart rhythms.  Blood clotting problems that could cause a stroke. Supplies needed:  Scale for weighing yourself.  Blood pressure monitor.  Notebook.  Medicines. How to care for yourself when you have heart failure Medicines Take over-the-counter and prescription medicines only as told by your doctor. Take your medicines every day.  Do not stop taking your medicine unless your doctor tells you to do so.  Do not skip any medicines.  Get your prescriptions refilled before you run out of medicine. This is important.  Talk with your doctor if you cannot afford your medicines. Eating and drinking  Eat heart-healthy foods. Talk with a diet specialist (dietitian) to create an eating plan.  Limit salt (sodium) if told by your doctor. Ask your diet specialist to tell you which seasonings are healthy for your heart.  Cook in healthy ways instead of frying. Healthy ways of cooking include roasting, grilling, broiling, baking, poaching, steaming, and stir-frying.  Choose foods that: ? Have no trans fat. ? Are low in saturated fat and cholesterol.  Choose healthy foods, such as: ? Fresh or frozen fruits and vegetables. ? Fish. ? Low-fat (lean) meats. ? Legumes, such as beans, peas, and lentils. ? Fat-free or low-fat dairy products. ? Whole-grain foods. ? High-fiber foods.  Limit how much fluid you drink, if told by your doctor.   Alcohol use  Do not drink alcohol if: ? Your doctor tells you not to drink. ? Your heart was damaged by alcohol, or you have very bad heart failure. ? You are pregnant, may be pregnant, or are planning to become pregnant.  If you drink alcohol: ? Limit how much you have to:  0-1 drink a day for women.  0-2 drinks a day for men. ? Know how much alcohol is in your drink. In the [place] one drink equals one 12 oz bottle  of beer (355 mL), one 5 oz glass of wine (148 mL), or one 1 oz glass of hard liquor (44 mL). Lifestyle  Do not smoke or use any products that contain nicotine or tobacco. If you need help quitting, ask your doctor. ? Do not use nicotine gum or patches before talking to your doctor.  Do not use illegal drugs.  Lose weight if told by your doctor.  Do physical activity if told by your doctor. Talk to your doctor before you begin an exercise if: ? You are an older adult. ? You have very bad heart failure.  Learn to manage stress. If you need help, ask your doctor.  Get physical rehab (rehabilitation) to help you stay independent and to help with your quality of life.  Participate in a cardiac rehab program. This program helps you improve your health through exercise, education, and counseling.  Plan time to rest when you get tired.   Check weight and blood pressure  Weigh yourself every day. This will help you to know if fluid is building up  in your body. ? Weigh yourself every morning after you pee (urinate) and before you eat breakfast. ? Wear the same amount of clothing each time. ? Write down your daily weight. Give your record to your doctor.  Check and write down your blood pressure as told by your doctor.  Check your pulse as told by your doctor.   Dealing with very hot and very cold weather  If it is very hot: ? Avoid activities that take a lot of energy. ? Use air conditioning or fans, or find a cooler place. ? Avoid caffeine and alcohol. ? Wear clothing that is loose-fitting, lightweight, and light-colored.  If it is very cold: ? Avoid activities that take a lot of energy. ? Layer your clothes. ? Wear mittens or gloves, a hat, and a face covering when you go outside. ? Avoid alcohol. Follow these instructions at home:  Stay up to date with shots (vaccines). Get pneumococcal and flu (influenza) shots.  Keep all follow-up visits. Contact a doctor if:  You gain  2-3 lb (1-1.4 kg) in 24 hours or 5 lb (2.3 kg) in a week.  You have increasing shortness of breath.  You cannot do your normal activities.  You get tired easily.  You cough a lot.  You do not feel like eating or feel like you may vomit (nauseous).  You have swelling in your hands, feet, ankles, or belly (abdomen).  You cannot sleep well because it is hard to breathe.  You feel like your heart is beating fast (palpitations).  You get dizzy when you stand up.  You feel depressed or sad. Get help right away if:  You have trouble breathing.  You or someone else notices a change in your behavior, such as having trouble staying awake.  You have chest pain or discomfort.  You pass out (faint). These symptoms may be an emergency. Get help right away. Call your local emergency services (911 in the [place]  Do not wait to see if the symptoms will go away.  Do not drive yourself to the hospital. Summary  Heart failure is a serious condition. To care for yourself, you may have to change your diet, take medicines, and make other lifestyle changes.  Take your medicines every day. Do not stop taking them unless your doctor tells you to do so.  Limit salt and eat heart-healthy foods.  Ask your doctor if you can drink alcohol. You may have to stop alcohol use if you have very bad heart failure.  Contact your doctor if you gain weight quickly or feel that your heart is beating too fast. Get help right away if you pass out or have chest pain or trouble breathing. This information is not intended to replace advice given to you by your health care provider. Make sure you discuss any questions you have with your health care provider. Document Revised: 12/02/2019 Document Reviewed: 12/02/2019 Elsevier Patient Education  [place].

## 2020-08-27 NOTE — Discharge Summary (Signed)
Physician Discharge Summary   Patient ID: [name] MRN: [id number] DOB/AGE: [birth year] 47 y.o.  Admit date: 08/25/2020 Discharge date: 08/27/2020  Primary Care Physician:  Hoyt Koch, MD   Recommendations for Outpatient Follow-up:  1. Follow up with PCP in 1-2 weeks 2. Entresto, torsemide currently placed on hold until cardiology follow-up 3. Amiodarone increased to 200 mg twice daily 4. Started on Jardiance 10 mg daily 5. Coreg discontinued, started on Toprol-XL 50 mg daily  Home Health: None Equipment/Devices:   Discharge Condition: stable  CODE STATUS: FULL  Diet recommendation: Heart healthy diet   Discharge Diagnoses:     . chronic systolic (congestive) heart failure ([place]) . History of postpartum cardiomyopathy Frequent PVCs with history of VT, AICD . OSA (obstructive sleep apnea) . Essential hypertension . Asthma Diet-controlled diabetes mellitus type 2 Obesity  Consults: Cardiology    Allergies:   Allergies  Allergen Reactions  . Vancomycin Other (See Comments)    "did something to my kidneys," PROGRESSED TO KIDNEY FAILURE!!  Marland Kitchen Aspirin Other (See Comments)    Wheezing, (Pt states that she just wheezes some when she takes aspirin by itself but she can take aspirin in a combination product).  . Contrast Media [Iodinated Diagnostic Agents] Other (See Comments)    Multiple CT contrast studies done over 2 weeks caused ARF  . Avocado Itching and Swelling  . Ciprofloxacin Itching and Rash  . Farxiga [Dapagliflozin] Rash  . Sulfa Antibiotics Itching and Rash     DISCHARGE MEDICATIONS: Allergies as of 08/27/2020      Reactions   Vancomycin Other (See Comments)   "did something to my kidneys," PROGRESSED TO KIDNEY FAILURE!!   Aspirin Other (See Comments)   Wheezing, (Pt states that she just wheezes some when she takes aspirin by itself but she can take aspirin in a combination product).   Contrast Media [iodinated Diagnostic Agents] Other (See  Comments)   Multiple CT contrast studies done over 2 weeks caused ARF   Avocado Itching, Swelling   Ciprofloxacin Itching, Rash   Farxiga [dapagliflozin] Rash   Sulfa Antibiotics Itching, Rash      Medication List    STOP taking these medications   carvedilol 12.5 MG tablet Commonly known as: Coreg     TAKE these medications   albuterol 108 (90 Base) MCG/ACT inhaler Commonly known as: ProAir HFA Inhale 1-2 puffs into the lungs every 6 (six) hours as needed for wheezing or shortness of breath.   amiodarone 200 MG tablet Commonly known as: PACERONE Take 1 tablet (200 mg total) by mouth 2 (two) times daily. What changed: when to take this Notes to patient: **DOSE INCREASE** To prevent fast heart rate   budesonide-formoterol 160-4.5 MCG/ACT inhaler Commonly known as: Symbicort Inhale 2 puffs into the lungs 2 (two) times daily as needed.   cyanocobalamin 1000 MCG/ML injection Commonly known as: (VITAMIN B-12) Inject 1 mL (1,000 mcg total) into the muscle as directed. Once daily for 7 days, then once weekly for 4 weeks followed by once monthly   diclofenac 75 MG EC tablet Commonly known as: VOLTAREN Take 1 tablet (75 mg total) by mouth 2 (two) times daily as needed. What changed:   when to take this  reasons to take this   diclofenac Sodium 1 % Gel Commonly known as: VOLTAREN Apply 4 g topically 4 (four) times daily.   dicyclomine 20 MG tablet Commonly known as: BENTYL Take 1 tablet (20 mg total) by mouth daily as needed for  spasms.   empagliflozin 10 MG Tabs tablet Commonly known as: Jardiance Take 1 tablet (10 mg total) by mouth daily. Notes to patient: **NEW** To treat your heart and prevent kidney issues   fluticasone 50 MCG/ACT nasal spray Commonly known as: FLONASE Place 2 sprays into both nostrils daily as needed for allergies.   gabapentin 300 MG capsule Commonly known as: NEURONTIN Take 1 capsule (300 mg total) by mouth at bedtime as needed (back  spasms).   ipratropium-albuterol 0.5-2.5 (3) MG/3ML Soln Commonly known as: DUONEB Take 3 mLs by nebulization every 6 (six) hours as needed.   ivabradine 7.5 MG Tabs tablet Commonly known as: Corlanor Take 1 tablet (7.5 mg total) by mouth 2 (two) times daily with a meal.   lidocaine 5 % Commonly known as: Lidoderm Place 1 patch onto the skin daily. Remove & Discard patch within 12 hours or as directed by MD   Magnesium Oxide 200 MG Tabs Take 1 tablet (200 mg total) by mouth daily.   metoprolol succinate 50 MG 24 hr tablet Commonly known as: TOPROL-XL Take 1 tablet (50 mg total) by mouth daily. Take with or immediately following a meal. Start taking on: August 28, 2020 Notes to patient: **NEW** To lower heart rate and blood pressure. STOP TAKING carvedilol, this medication replaces carvedilol   montelukast 10 MG tablet Commonly known as: SINGULAIR Take 1 tablet (10 mg total) by mouth at bedtime.   MULTIVITAMIN GUMMIES WOMENS PO Take 2 tablets by mouth at bedtime.   nitroGLYCERIN 0.4 MG SL tablet Commonly known as: NITROSTAT Place 1 tablet (0.4 mg total) under the tongue every 5 (five) minutes as needed for chest pain.   omeprazole 20 MG capsule Commonly known as: PRILOSEC Take 1 capsule (20 mg total) by mouth daily as needed (for reflux symptoms).   sacubitril-valsartan 97-103 MG Commonly known as: ENTRESTO Take 1 tablet by mouth 2 (two) times daily. HOLD until follow-up with cardiology What changed: additional instructions Notes to patient: HOLD UNTIL follow up with cardiologist   tiZANidine 4 MG tablet Commonly known as: ZANAFLEX Take 4 mg by mouth every 8 (eight) hours as needed for muscle spasms.   torsemide 20 MG tablet Commonly known as: DEMADEX Take 1 tablet (20 mg total) by mouth daily. HOLD until follow-up with cardiology What changed: additional instructions Notes to patient: HOLD UNTIL follow up with cardiologist   Vitamin D (Ergocalciferol) 1.25 MG  (50000 UNIT) Caps capsule Commonly known as: DRISDOL Take 1 capsule (50,000 Units total) by mouth every 7 (seven) days.        Brief H and P: For complete details please refer to admission H and P, but in brief *Patient is a 45 year old female with history of postpartum cardiomyopathy EF 20%, history of VT with AICD, anemia, gastric sleeve surgery, obesity, IBS, diabetes mellitus 2, diet-controlled presented with progressive generalized fatigue, palpitations, intermittent chest pain when she tried to ambulate.  No fevers or chills, no leg edema.  No syncope and her ICD did not fire. AICD was interrogated in ED.  Patient also received a dose of Lasix and cardiology.   Hospital Course:   Chronic systolic CHF, postpartum cardiomyopathy with a EF of 20%, NICM, nonspecific chest pain -Cardiology was consulted who did not feel patient was having acute volume overload, chest x-ray was clear.  Patient had received diuretic in ED, held further diuresis. -Continue to hold diuretics, patient was given additional 250 cc normal saline to hydrate due to dropping BP. -Cardiology following,  continue ivabradine -Repeat 2D echo showed EF of 20 to 25%, global hypokinesis, normal right ventricular systolic function -Patient was seen by Dr. Acie Fredrickson also discussed with EP cardiology and CHF team.  At this time recommendations are to increase amiodarone to 200 mg twice daily, Coreg discontinued and patient started on Toprol-XL.  50 mg daily -Hold Entresto, torsemide and to follow-up with cardiology  Frequent PVCs, with history of VT, AICD -Patient was placed on IV amiodarone which has been now transitioned to oral amiodarone 200 mg twice a day upon discharge    Essential hypertension -For now continue Coreg, hold Entresto and torsemide until follow-up    OSA (obstructive sleep apnea) -Continue CPAP qhs    DM (diabetes mellitus), type 2 ([place]), diet controlled -Hemoglobin A1c 5.5.  CBGs well-controlled     Asthma -Currently stable, no wheezing  Obesity Estimated body mass index is 37.31 kg/m as calculated from the following:   Height as of this encounter: 5\' 5"  (1.651 m).   Weight as of this encounter: 101.7 kg.   Day of Discharge S: No acute complaints, hoping to go home today.  No chest pain, worsening shortness of breath.  No tachycardia.  BP 110/69 (BP Location: Right Wrist)   Pulse 67   Temp 98.4 F (36.9 C) (Oral)   Resp 19   Ht 5\' 5"  (1.651 m)   Wt 101.8 kg   LMP 07/26/2020   SpO2 100%   BMI 37.35 kg/m   Physical Exam: General: Alert and awake oriented x3 not in any acute distress. CVS: S1-S2 clear no murmur rubs or gallops Chest: clear to auscultation bilaterally, no wheezing rales or rhonchi Abdomen: soft nontender, nondistended, normal bowel sounds Extremities: no cyanosis, clubbing or edema noted bilaterally Neuro: no new deficits    Get Medicines reviewed and adjusted: Please take all your medications with you for your next visit with your Primary MD  Please request your Primary MD to go over all hospital tests and procedure/radiological results at the follow up. Please ask your Primary MD to get all Hospital records sent to his/her office.  If you experience worsening of your admission symptoms, develop shortness of breath, life threatening emergency, suicidal or homicidal thoughts you must seek medical attention immediately by calling 911 or calling your MD immediately  if symptoms less severe.  You must read complete instructions/literature along with all the possible adverse reactions/side effects for all the Medicines you take and that have been prescribed to you. Take any new Medicines after you have completely understood and accept all the possible adverse reactions/side effects.   Do not drive when taking pain medications.   Do not take more than prescribed Pain, Sleep and Anxiety Medications  Special Instructions: If you have smoked or chewed  Tobacco  in the last 2 yrs please stop smoking, stop any regular Alcohol  and or any Recreational drug use.  Wear Seat belts while driving.  Please note  You were cared for by a hospitalist during your hospital stay. Once you are discharged, your primary care physician will handle any further medical issues. Please note that NO REFILLS for any discharge medications will be authorized once you are discharged, as it is imperative that you return to your primary care physician (or establish a relationship with a primary care physician if you do not have one) for your aftercare needs so that they can reassess your need for medications and monitor your lab values.   The results of significant diagnostics from  this hospitalization (including imaging, microbiology, ancillary and laboratory) are listed below for reference.      Procedures/Studies:  DG Chest Portable 1 View  Result Date: 08/25/2020 CLINICAL DATA:  Chest pain EXAM: PORTABLE CHEST 1 VIEW COMPARISON:  November 27, 2019. FINDINGS: Enlarged cardiac silhouette. Left subclavian approach single lead cardiac rhythm maintenance device in similar position. No consolidation. No visible pleural effusions or pneumothorax. No evidence of acute osseous abnormality. IMPRESSION: 1. No acute cardiopulmonary disease. 2. Cardiomegaly. Electronically Signed   By: Margaretha Sheffield MD   On: 08/25/2020 15:46   ECHOCARDIOGRAM COMPLETE  Result Date: 08/26/2020    ECHOCARDIOGRAM REPORT   Patient Name:   [name] Date of Exam: 08/26/2020 Medical Rec #:  [id number]      Height:       65.0 in Accession #:    [id number]     Weight:       231.3 lb Date of Birth:  14-Sep-1975      BSA:          2.104 m Patient Age:    24 years       BP:           103/73 mmHg Patient Gender: F              HR:           74 bpm. Exam Location:  Inpatient Procedure: 2D Echo Indications:    Acute systolic CHF  History:        Patient has prior history of Echocardiogram examinations, most                  recent 11/13/2019. Cardiomyopathy, Defibrillator; Risk                 Factors:Sleep Apnea, Hypertension and Diabetes.  Sonographer:    [name] Referring Phys: [place]  1. Left ventricular ejection fraction, by estimation, is 20 to 25%. The left ventricle has severely decreased function. The left ventricle demonstrates global hypokinesis. The left ventricular internal cavity size was moderately dilated. Left ventricular diastolic parameters were normal.  2. AICD wires in RA/RV. Right ventricular systolic function is normal. The right ventricular size is normal. There is normal pulmonary artery systolic pressure.  3. Left atrial size was mildly dilated.  4. The mitral valve is abnormal. Mild mitral valve regurgitation. No evidence of mitral stenosis.  5. The aortic valve is tricuspid. Aortic valve regurgitation is not visualized. No aortic stenosis is present.  6. The inferior vena cava is normal in size with greater than 50% respiratory variability, suggesting right atrial pressure of 3 mmHg. FINDINGS  Left Ventricle: Left ventricular ejection fraction, by estimation, is 20 to 25%. The left ventricle has severely decreased function. The left ventricle demonstrates global hypokinesis. The left ventricular internal cavity size was moderately dilated. There is no left ventricular hypertrophy. Left ventricular diastolic parameters were normal. Right Ventricle: AICD wires in RA/RV. The right ventricular size is normal. No increase in right ventricular wall thickness. Right ventricular systolic function is normal. There is normal pulmonary artery systolic pressure. The tricuspid regurgitant velocity is 1.67 m/s, and with an assumed right atrial pressure of 3 mmHg, the estimated right ventricular systolic pressure is 81.7 mmHg. Left Atrium: Left atrial size was mildly dilated. Right Atrium: Right atrial size was normal in size. Pericardium: There is no evidence of pericardial  effusion. Mitral Valve: The mitral valve is abnormal. There is mild thickening of the mitral  valve leaflet(s). Mild mitral valve regurgitation. No evidence of mitral valve stenosis. Tricuspid Valve: The tricuspid valve is normal in structure. Tricuspid valve regurgitation is mild . No evidence of tricuspid stenosis. Aortic Valve: The aortic valve is tricuspid. Aortic valve regurgitation is not visualized. No aortic stenosis is present. Pulmonic Valve: The pulmonic valve was normal in structure. Pulmonic valve regurgitation is not visualized. No evidence of pulmonic stenosis. Aorta: The aortic root is normal in size and structure. Venous: The inferior vena cava is normal in size with greater than 50% respiratory variability, suggesting right atrial pressure of 3 mmHg. IAS/Shunts: No atrial level shunt detected by color flow Doppler. Additional Comments: A device lead is visualized.  LEFT VENTRICLE PLAX 2D LVIDd:         5.70 cm      Diastology LVIDs:         4.90 cm      LV e' medial:    7.01 cm/s LV PW:         0.90 cm      LV E/e' medial:  11.9 LV IVS:        0.90 cm      LV e' lateral:   9.46 cm/s LVOT diam:     2.20 cm      LV E/e' lateral: 8.8 LV SV:         70 LV SV Index:   33 LVOT Area:     3.80 cm  LV Volumes (MOD) LV vol d, MOD A4C: 127.0 ml LV vol s, MOD A4C: 83.3 ml LV SV MOD A4C:     127.0 ml RIGHT VENTRICLE             IVC RV S prime:     10.70 cm/s  IVC diam: 1.10 cm TAPSE (M-mode): 2.2 cm LEFT ATRIUM             Index       RIGHT ATRIUM           Index LA diam:        4.20 cm 2.00 cm/m  RA Area:     12.10 cm LA Vol (A2C):   60.3 ml 28.66 ml/m RA Volume:   26.60 ml  12.64 ml/m LA Vol (A4C):   77.4 ml 36.79 ml/m LA Biplane Vol: 74.0 ml 35.17 ml/m  AORTIC VALVE LVOT Vmax:   94.00 cm/s LVOT Vmean:  67.100 cm/s LVOT VTI:    0.183 m  AORTA Ao Asc diam: 3.20 cm MITRAL VALVE               TRICUSPID VALVE MV Area (PHT): 4.31 cm    TR Peak grad:   11.2 mmHg MV Decel Time: 176 msec    TR Vmax:         167.00 cm/s MV E velocity: 83.60 cm/s MV A velocity: 41.10 cm/s  SHUNTS MV E/A ratio:  2.03        Systemic VTI:  0.18 m                            Systemic Diam: 2.20 cm Jenkins Rouge MD Electronically signed by Jenkins Rouge MD Signature Date/Time: 08/26/2020/12:02:28 PM    Final        LAB RESULTS: Basic Metabolic Panel: Recent Labs  Lab 08/25/20 1419 08/26/20 0201  NA 136 136  K 3.8 4.7  CL 106 101  CO2 25 26  GLUCOSE 92  104*  BUN 8 11  CREATININE 0.69 0.89  CALCIUM 9.0 9.4  MG 2.0 2.1   Liver Function Tests: Recent Labs  Lab 08/26/20 0201  AST 22  ALT 15  ALKPHOS 92  BILITOT 0.4  PROT 7.6  ALBUMIN 3.8   No results for input(s): LIPASE, AMYLASE in the last 168 hours. No results for input(s): AMMONIA in the last 168 hours. CBC: Recent Labs  Lab 08/25/20 1419 08/26/20 0201  WBC 7.6 9.7  NEUTROABS 4.0 6.9  HGB 9.1* 10.1*  HCT 32.2* 35.6*  MCV 70.9* 71.5*  PLT 307 307   Cardiac Enzymes: No results for input(s): CKTOTAL, CKMB, CKMBINDEX, TROPONINI in the last 168 hours. BNP: Invalid input(s): POCBNP CBG: Recent Labs  Lab 08/27/20 0748 08/27/20 1112  GLUCAP 84 87       Disposition and Follow-up: Discharge Instructions    (HEART FAILURE PATIENTS) Call MD:  Anytime you have any of the following symptoms: 1) 3 pound weight gain in 24 hours or 5 pounds in 1 week 2) shortness of breath, with or without a dry hacking cough 3) swelling in the hands, feet or stomach 4) if you have to sleep on extra pillows at night in order to breathe.   Complete by: As directed    Diet - low sodium heart healthy   Complete by: As directed    Increase activity slowly   Complete by: As directed        DISPOSITION: Home   DISCHARGE FOLLOW-UP  Follow-up Information    Hoyt Koch, MD. Go on 09/03/2020.   Specialty: Internal Medicine Why: @3 :00pm Contact information: [place] [address] [phone]        Jolaine Artist, MD  Follow up on 09/03/2020.   Specialty: Cardiology Why: 10:30 am Contact information: [address] [place] [address] [phone]        Belva Crome, MD .   Specialty: Cardiology Contact information: [phone] [address] [place] [address] [phone]        Evans Lance, MD Follow up on 09/05/2020.   Specialty: Cardiology Why: 12:35 pm Contact information: [address] [place] [address] [phone]                Time coordinating discharge:  35 minutes  Signed:   Estill Cotta M.D. [facility] 08/27/2020, 1:52 PM

## 2020-08-27 NOTE — Progress Notes (Signed)
Heart Failure Stewardship Pharmacist Progress Note   PCP: Hoyt Koch, MD PCP-Cardiologist: Sinclair Grooms, MD    HPI:  45 yo F with HTN, obesity, T2DM, IBS, depression, anxiety, OSA, DVT, and CHF. She presented to the ED on 08/25/20 with shortness of breath, cough, and chest pain. An ECHO was done on 08/26/20 and LVEF was 20-25% (was <20% in June 2021)  Discharge HF Medications: Torsemide 20 mg daily Metoprolol XL 50 mg daily Entresto 97/103 mg BID Jardiance 10 mg daily Ivabradine 7.5 mg BID  Prior to admission HF Medications: Torsemide 20 mg daily Carvedilol 18.75 mg BID Entresto 97/103 mg BID Ivabradine 7.5 mg BID  Pertinent Lab Values: . Serum creatinine 0.89, BUN 11, Potassium 4.7, Sodium 136, BNP 301.6, Magnesium 2.1   Vital Signs: . Weight: 224 lbs (admission weight: 231 lbs) . Blood pressure: 110/60s  . Heart rate: 70s   Medication Assistance / Insurance Benefits Check: Does the patient have prescription insurance?  Yes Type of insurance plan: [employer]  Outpatient Pharmacy:  Prior to admission outpatient pharmacy: [facility]  Assessment: 1. Acute on chronic systolic CHF (EF 59-97%), due to peri-partum CM (onset 1999). NYHA class II symptoms. - Continue torsemide 20 mg daily (hold until f/u with HF clinic) - Continue metoprolol XL 50 mg daily - Continue Entresto 97/103 mg BID (hold until f/u with HF clinic) - Continue Jardiance 10 mg daily - Consider starting spironolactone at follow up (was taking this previously but was stopped at discharge in June 2021). - Continue ivabradine 7.5 mg BID   Plan: 1) Medication changes recommended at this time: - Continue current regimen at discharge - Consider restarting Entresto/torsemide or adding spironolactone at follow up  2) Patient assistance: - Had followed with HF clinic (last in 11/2019) with follow up to return in 4 weeks. It does not appear that this visit was done.  - Appt  post-discharge will be made in HF clinic with NP/PA  Kerby Nora, PharmD, BCPS Heart Failure Stewardship Pharmacist Phone [phone]

## 2020-08-27 NOTE — Plan of Care (Signed)

## 2020-08-27 NOTE — TOC Transition Note (Signed)
Transition of Care [facility]) - CM/SW Discharge Note   Patient Details  Name: [name] MRN: [id number] Date of Birth: 1976-04-01  Transition of Care [facility]) CM/SW Contact:  Zenon Mayo, RN Phone Number: 08/27/2020, 12:00 PM   Clinical Narrative:    NCM spoke with patient at bedside, she lives with spouse and daughter, she is independent.  She has a scale and weighs her self daily, she has a bp cuff.  She east a slow salt diet, she has transportation home.  She has no other needs.    Final next level of care: Home/Self Care Barriers to Discharge: No Barriers Identified   Patient Goals and CMS Choice Patient states their goals for this hospitalization and ongoing recovery are:: return home   Choice offered to / list presented to : NA  Discharge Placement                       Discharge Plan and Services                  DME Agency: NA       HH Arranged: NA          Social Determinants of Health (SDOH) Interventions     Readmission Risk Interventions No flowsheet data found.

## 2020-08-27 NOTE — Progress Notes (Signed)
D/C instructions given and reviewed. Tele and IV's removed, tolerated well. Waiting on personal transport.

## 2020-08-27 NOTE — Progress Notes (Signed)
Patient decided she wanted to try CPAP while sleeping.  Set patient up on auto titrate with nasal mask.  Tolerating well at this time.

## 2020-08-27 NOTE — Progress Notes (Addendum)
Progress Note  Patient Name: [name] Date of Encounter: 08/27/2020  [facility] Cardiologist: Sinclair Grooms, MD    Subjective   45 year old female with a nonischemic cardiomyopathy, frequent premature ventricular contractions, ICD. She was having worsening PVCs on admission.  Her potassium was low.  The symptoms and PVCs seem to have improved significantly with increasing her amiodarone.  Inpatient Medications    Scheduled Meds: . amiodarone  200 mg Oral BID  . enoxaparin (LOVENOX) injection  40 mg Subcutaneous Q24H  . insulin aspart  0-9 Units Subcutaneous Q4H  . ivabradine  7.5 mg Oral BID WC  . metoprolol succinate  50 mg Oral Daily  . mometasone-formoterol  2 puff Inhalation BID  . montelukast  10 mg Oral QHS  . sodium chloride flush  3 mL Intravenous Q12H   Continuous Infusions: . sodium chloride     PRN Meds: sodium chloride, acetaminophen, gabapentin, ondansetron (ZOFRAN) IV, sodium chloride flush   Vital Signs    Vitals:   08/27/20 0355 08/27/20 0412 08/27/20 0750 08/27/20 0752  BP: (!) 103/47  (!) 105/57   Pulse: 80  74   Resp: 19  18   Temp: 98.3 F (36.8 C)  98.2 F (36.8 C)   TempSrc: Oral  Oral   SpO2: 96%  100% 97%  Weight:  101.8 kg    Height:        Intake/Output Summary (Last 24 hours) at 08/27/2020 0916 Last data filed at 08/27/2020 0349 Gross per 24 hour  Intake 738.3 ml  Output --  Net 738.3 ml   Last 3 Weights 08/27/2020 08/26/2020 08/26/2020  Weight (lbs) 224 lb 6.9 oz 224 lb 6.9 oz 224 lb 3.3 oz  Weight (kg) 101.8 kg 101.8 kg 101.7 kg      Telemetry    NSR - Personally Reviewed  ECG     - Personally Reviewed  Physical Exam   GEN: No acute distress.  Moderately obese    Neck: No JVD Cardiac: RRR, no murmurs, rubs, or gallops.  Respiratory: Clear to auscultation bilaterally. GI: Soft, nontender, non-distended  MS: No edema; No deformity. Neuro:  Nonfocal  Psych: Normal affect   Labs    High Sensitivity Troponin:    Recent Labs  Lab 08/25/20 1419 08/25/20 1610  TROPONINIHS 5 6      Chemistry Recent Labs  Lab 08/25/20 1419 08/26/20 0201  NA 136 136  K 3.8 4.7  CL 106 101  CO2 25 26  GLUCOSE 92 104*  BUN 8 11  CREATININE 0.69 0.89  CALCIUM 9.0 9.4  PROT  --  7.6  ALBUMIN  --  3.8  AST  --  22  ALT  --  15  ALKPHOS  --  92  BILITOT  --  0.4  GFRNONAA >60 >60  ANIONGAP 5 9     Hematology Recent Labs  Lab 08/25/20 1419 08/26/20 0201  WBC 7.6 9.7  RBC 4.54 4.98  HGB 9.1* 10.1*  HCT 32.2* 35.6*  MCV 70.9* 71.5*  MCH 20.0* 20.3*  MCHC 28.3* 28.4*  RDW 17.7* 18.6*  PLT 307 307    BNP Recent Labs  Lab 08/25/20 1419  BNP 301.6*     DDimer No results for input(s): DDIMER in the last 168 hours.   Radiology    DG Chest Portable 1 View  Result Date: 08/25/2020 CLINICAL DATA:  Chest pain EXAM: PORTABLE CHEST 1 VIEW COMPARISON:  November 27, 2019. FINDINGS: Enlarged cardiac silhouette.  Left subclavian approach single lead cardiac rhythm maintenance device in similar position. No consolidation. No visible pleural effusions or pneumothorax. No evidence of acute osseous abnormality. IMPRESSION: 1. No acute cardiopulmonary disease. 2. Cardiomegaly. Electronically Signed   By: Margaretha Sheffield MD   On: 08/25/2020 15:46   ECHOCARDIOGRAM COMPLETE  Result Date: 08/26/2020    ECHOCARDIOGRAM REPORT   Patient Name:   [name] Date of Exam: 08/26/2020 Medical Rec #:  [id number]      Height:       65.0 in Accession #:    [id number]     Weight:       231.3 lb Date of Birth:  [birth year]      BSA:          2.104 m Patient Age:    52 years       BP:           103/73 mmHg Patient Gender: F              HR:           74 bpm. Exam Location:  Inpatient Procedure: 2D Echo Indications:    Acute systolic CHF  History:        Patient has prior history of Echocardiogram examinations, most                 recent 11/13/2019. Cardiomyopathy, Defibrillator; Risk                 Factors:Sleep Apnea, Hypertension  and Diabetes.  Sonographer:    [name] Referring Phys: [place]  1. Left ventricular ejection fraction, by estimation, is 20 to 25%. The left ventricle has severely decreased function. The left ventricle demonstrates global hypokinesis. The left ventricular internal cavity size was moderately dilated. Left ventricular diastolic parameters were normal.  2. AICD wires in RA/RV. Right ventricular systolic function is normal. The right ventricular size is normal. There is normal pulmonary artery systolic pressure.  3. Left atrial size was mildly dilated.  4. The mitral valve is abnormal. Mild mitral valve regurgitation. No evidence of mitral stenosis.  5. The aortic valve is tricuspid. Aortic valve regurgitation is not visualized. No aortic stenosis is present.  6. The inferior vena cava is normal in size with greater than 50% respiratory variability, suggesting right atrial pressure of 3 mmHg. FINDINGS  Left Ventricle: Left ventricular ejection fraction, by estimation, is 20 to 25%. The left ventricle has severely decreased function. The left ventricle demonstrates global hypokinesis. The left ventricular internal cavity size was moderately dilated. There is no left ventricular hypertrophy. Left ventricular diastolic parameters were normal. Right Ventricle: AICD wires in RA/RV. The right ventricular size is normal. No increase in right ventricular wall thickness. Right ventricular systolic function is normal. There is normal pulmonary artery systolic pressure. The tricuspid regurgitant velocity is 1.67 m/s, and with an assumed right atrial pressure of 3 mmHg, the estimated right ventricular systolic pressure is 10.2 mmHg. Left Atrium: Left atrial size was mildly dilated. Right Atrium: Right atrial size was normal in size. Pericardium: There is no evidence of pericardial effusion. Mitral Valve: The mitral valve is abnormal. There is mild thickening of the mitral valve leaflet(s).  Mild mitral valve regurgitation. No evidence of mitral valve stenosis. Tricuspid Valve: The tricuspid valve is normal in structure. Tricuspid valve regurgitation is mild . No evidence of tricuspid stenosis. Aortic Valve: The aortic valve is tricuspid. Aortic valve regurgitation is not visualized. No aortic stenosis  is present. Pulmonic Valve: The pulmonic valve was normal in structure. Pulmonic valve regurgitation is not visualized. No evidence of pulmonic stenosis. Aorta: The aortic root is normal in size and structure. Venous: The inferior vena cava is normal in size with greater than 50% respiratory variability, suggesting right atrial pressure of 3 mmHg. IAS/Shunts: No atrial level shunt detected by color flow Doppler. Additional Comments: A device lead is visualized.  LEFT VENTRICLE PLAX 2D LVIDd:         5.70 cm      Diastology LVIDs:         4.90 cm      LV e' medial:    7.01 cm/s LV PW:         0.90 cm      LV E/e' medial:  11.9 LV IVS:        0.90 cm      LV e' lateral:   9.46 cm/s LVOT diam:     2.20 cm      LV E/e' lateral: 8.8 LV SV:         70 LV SV Index:   33 LVOT Area:     3.80 cm  LV Volumes (MOD) LV vol d, MOD A4C: 127.0 ml LV vol s, MOD A4C: 83.3 ml LV SV MOD A4C:     127.0 ml RIGHT VENTRICLE             IVC RV S prime:     10.70 cm/s  IVC diam: 1.10 cm TAPSE (M-mode): 2.2 cm LEFT ATRIUM             Index       RIGHT ATRIUM           Index LA diam:        4.20 cm 2.00 cm/m  RA Area:     12.10 cm LA Vol (A2C):   60.3 ml 28.66 ml/m RA Volume:   26.60 ml  12.64 ml/m LA Vol (A4C):   77.4 ml 36.79 ml/m LA Biplane Vol: 74.0 ml 35.17 ml/m  AORTIC VALVE LVOT Vmax:   94.00 cm/s LVOT Vmean:  67.100 cm/s LVOT VTI:    0.183 m  AORTA Ao Asc diam: 3.20 cm MITRAL VALVE               TRICUSPID VALVE MV Area (PHT): 4.31 cm    TR Peak grad:   11.2 mmHg MV Decel Time: 176 msec    TR Vmax:        167.00 cm/s MV E velocity: 83.60 cm/s MV A velocity: 41.10 cm/s  SHUNTS MV E/A ratio:  2.03        Systemic VTI:   0.18 m                            Systemic Diam: 2.20 cm Jenkins Rouge MD Electronically signed by Jenkins Rouge MD Signature Date/Time: 08/26/2020/12:02:28 PM    Final     Cardiac Studies      Patient Profile     45 y.o. female with dilated cardiomyopathy, PVCs, ICD. Admitted with increasing PVC  Assessment & Plan    1.   PVCs:   Better on increaed dose of amio.   I have discussed with EP . She will be DC'd on Amiodarone 200 mg BID and follow up with Dr. Lovena Le or Oda Kilts, PA for dose adjustment   2.  Chronic combined CHF:  Her BP is soft - even after being off the torsemide and Entresto for several days.  Discussed the case with Ellen Henri, NP who discussed it with Dr. Haroldine Laws. We will start her on Jardiance 10 mg a day. She will be seen in the heart failure clinic and they will make adjustments and potentially restart her in her Delene Loll and possibly her torsemide as an outpatient.   [facility] will sign off.   Medication Recommendations:  As above  Other recommendations (labs, testing, etc):   Follow up as an outpatient:  As above  For questions or updates, please contact [place] Please consult [email or web address] for contact info under        Signed, Mertie Moores, MD  08/27/2020, 9:16 AM

## 2020-08-28 ENCOUNTER — Other Ambulatory Visit: Payer: Self-pay

## 2020-08-28 ENCOUNTER — Inpatient Hospital Stay: Payer: HMO

## 2020-08-28 VITALS — BP 111/66 | HR 73 | Temp 98.4°F | Resp 17

## 2020-08-28 DIAGNOSIS — K909 Intestinal malabsorption, unspecified: Secondary | ICD-10-CM | POA: Diagnosis not present

## 2020-08-28 DIAGNOSIS — N920 Excessive and frequent menstruation with regular cycle: Secondary | ICD-10-CM | POA: Diagnosis not present

## 2020-08-28 DIAGNOSIS — Z8249 Family history of ischemic heart disease and other diseases of the circulatory system: Secondary | ICD-10-CM | POA: Diagnosis not present

## 2020-08-28 DIAGNOSIS — Z8261 Family history of arthritis: Secondary | ICD-10-CM | POA: Diagnosis not present

## 2020-08-28 DIAGNOSIS — D500 Iron deficiency anemia secondary to blood loss (chronic): Secondary | ICD-10-CM

## 2020-08-28 DIAGNOSIS — Z79899 Other long term (current) drug therapy: Secondary | ICD-10-CM | POA: Diagnosis not present

## 2020-08-28 DIAGNOSIS — Z9884 Bariatric surgery status: Secondary | ICD-10-CM | POA: Diagnosis not present

## 2020-08-28 DIAGNOSIS — Z836 Family history of other diseases of the respiratory system: Secondary | ICD-10-CM | POA: Diagnosis not present

## 2020-08-28 MED ORDER — SODIUM CHLORIDE 0.9 % IV SOLN
Freq: Once | INTRAVENOUS | Status: AC
  Filled 2020-08-28: qty 250

## 2020-08-28 MED ORDER — SODIUM CHLORIDE 0.9 % IV SOLN
200.0000 mg | Freq: Once | INTRAVENOUS | Status: AC
Start: 2020-08-28 — End: 2020-08-28
  Administered 2020-08-28: 200 mg via INTRAVENOUS
  Filled 2020-08-28: qty 10

## 2020-08-28 NOTE — Patient Instructions (Signed)

## 2020-08-29 ENCOUNTER — Ambulatory Visit: Payer: HMO | Admitting: Family Medicine

## 2020-08-30 ENCOUNTER — Other Ambulatory Visit: Payer: Self-pay

## 2020-08-30 ENCOUNTER — Inpatient Hospital Stay: Payer: HMO

## 2020-08-30 VITALS — BP 111/62 | HR 79 | Temp 98.4°F | Resp 18

## 2020-08-30 DIAGNOSIS — D500 Iron deficiency anemia secondary to blood loss (chronic): Secondary | ICD-10-CM

## 2020-08-30 MED ORDER — SODIUM CHLORIDE 0.9 % IV SOLN
Freq: Once | INTRAVENOUS | Status: AC
  Filled 2020-08-30: qty 250

## 2020-08-30 MED ORDER — SODIUM CHLORIDE 0.9 % IV SOLN
200.0000 mg | Freq: Once | INTRAVENOUS | Status: AC
  Administered 2020-08-30: 200 mg via INTRAVENOUS
  Filled 2020-08-30: qty 10

## 2020-08-30 NOTE — Patient Instructions (Signed)

## 2020-09-02 ENCOUNTER — Inpatient Hospital Stay: Payer: HMO

## 2020-09-02 ENCOUNTER — Telehealth (HOSPITAL_COMMUNITY): Payer: Self-pay | Admitting: Pharmacy Technician

## 2020-09-02 ENCOUNTER — Other Ambulatory Visit (HOSPITAL_COMMUNITY): Payer: Self-pay

## 2020-09-02 ENCOUNTER — Other Ambulatory Visit: Payer: Self-pay

## 2020-09-02 ENCOUNTER — Other Ambulatory Visit (HOSPITAL_COMMUNITY): Payer: Self-pay | Admitting: *Deleted

## 2020-09-02 ENCOUNTER — Encounter: Payer: Self-pay | Admitting: Hematology and Oncology

## 2020-09-02 ENCOUNTER — Encounter (HOSPITAL_COMMUNITY): Payer: Self-pay

## 2020-09-02 VITALS — BP 131/84 | HR 74 | Temp 98.7°F | Resp 18

## 2020-09-02 DIAGNOSIS — D500 Iron deficiency anemia secondary to blood loss (chronic): Secondary | ICD-10-CM | POA: Diagnosis not present

## 2020-09-02 MED ORDER — SODIUM CHLORIDE 0.9 % IV SOLN
200.0000 mg | Freq: Once | INTRAVENOUS | Status: AC
  Administered 2020-09-02: 200 mg via INTRAVENOUS
  Filled 2020-09-02: qty 200

## 2020-09-02 MED ORDER — IVABRADINE HCL 7.5 MG PO TABS: 7.5000 mg | ORAL_TABLET | Freq: Two times a day (BID) | ORAL | 2 refills | Status: DC

## 2020-09-02 MED ORDER — SODIUM CHLORIDE 0.9 % IV SOLN
Freq: Once | INTRAVENOUS | Status: AC
  Filled 2020-09-02: qty 250

## 2020-09-02 NOTE — Telephone Encounter (Signed)
Advanced Heart Failure Patient Advocate Encounter   Received notification from [employer] that prior authorization for Corlanor 7.5mg  is required.   PA submitted on CoverMyMeds Key [id number] Status is pending   Will continue to follow.

## 2020-09-03 ENCOUNTER — Encounter (HOSPITAL_COMMUNITY): Payer: Self-pay

## 2020-09-03 ENCOUNTER — Telehealth: Payer: Self-pay | Admitting: Hematology and Oncology

## 2020-09-03 ENCOUNTER — Ambulatory Visit (HOSPITAL_COMMUNITY)
Admit: 2020-09-03 | Discharge: 2020-09-03 | Disposition: A | Discharge status: Home / Self Care | Payer: HMO | Source: Ambulatory Visit | Attending: Cardiology | Admitting: Cardiology

## 2020-09-03 ENCOUNTER — Inpatient Hospital Stay: Payer: HMO | Admitting: Internal Medicine

## 2020-09-03 VITALS — BP 148/98 | HR 66 | Wt 230.4 lb

## 2020-09-03 DIAGNOSIS — Z86718 Personal history of other venous thrombosis and embolism: Secondary | ICD-10-CM | POA: Diagnosis not present

## 2020-09-03 DIAGNOSIS — Z6838 Body mass index (BMI) 38.0-38.9, adult: Secondary | ICD-10-CM | POA: Insufficient documentation

## 2020-09-03 DIAGNOSIS — K589 Irritable bowel syndrome without diarrhea: Secondary | ICD-10-CM | POA: Diagnosis not present

## 2020-09-03 DIAGNOSIS — R0602 Shortness of breath: Secondary | ICD-10-CM | POA: Insufficient documentation

## 2020-09-03 DIAGNOSIS — E119 Type 2 diabetes mellitus without complications: Secondary | ICD-10-CM | POA: Diagnosis not present

## 2020-09-03 DIAGNOSIS — I110 Hypertensive heart disease with heart failure: Secondary | ICD-10-CM | POA: Insufficient documentation

## 2020-09-03 DIAGNOSIS — Z5181 Encounter for therapeutic drug level monitoring: Secondary | ICD-10-CM | POA: Diagnosis not present

## 2020-09-03 DIAGNOSIS — R008 Other abnormalities of heart beat: Secondary | ICD-10-CM | POA: Insufficient documentation

## 2020-09-03 DIAGNOSIS — G8929 Other chronic pain: Secondary | ICD-10-CM | POA: Diagnosis not present

## 2020-09-03 DIAGNOSIS — Z886 Allergy status to analgesic agent status: Secondary | ICD-10-CM | POA: Diagnosis not present

## 2020-09-03 DIAGNOSIS — G4733 Obstructive sleep apnea (adult) (pediatric): Secondary | ICD-10-CM | POA: Insufficient documentation

## 2020-09-03 DIAGNOSIS — I5022 Chronic systolic (congestive) heart failure: Secondary | ICD-10-CM

## 2020-09-03 DIAGNOSIS — I5042 Chronic combined systolic (congestive) and diastolic (congestive) heart failure: Secondary | ICD-10-CM | POA: Insufficient documentation

## 2020-09-03 DIAGNOSIS — Z9884 Bariatric surgery status: Secondary | ICD-10-CM | POA: Diagnosis not present

## 2020-09-03 DIAGNOSIS — M545 Low back pain, unspecified: Secondary | ICD-10-CM | POA: Diagnosis not present

## 2020-09-03 DIAGNOSIS — Z8249 Family history of ischemic heart disease and other diseases of the circulatory system: Secondary | ICD-10-CM | POA: Insufficient documentation

## 2020-09-03 DIAGNOSIS — I493 Ventricular premature depolarization: Secondary | ICD-10-CM | POA: Diagnosis not present

## 2020-09-03 DIAGNOSIS — R002 Palpitations: Secondary | ICD-10-CM | POA: Insufficient documentation

## 2020-09-03 DIAGNOSIS — Z7901 Long term (current) use of anticoagulants: Secondary | ICD-10-CM | POA: Insufficient documentation

## 2020-09-03 DIAGNOSIS — Z79899 Other long term (current) drug therapy: Secondary | ICD-10-CM | POA: Diagnosis not present

## 2020-09-03 DIAGNOSIS — Z7984 Long term (current) use of oral hypoglycemic drugs: Secondary | ICD-10-CM | POA: Diagnosis not present

## 2020-09-03 DIAGNOSIS — I429 Cardiomyopathy, unspecified: Secondary | ICD-10-CM | POA: Diagnosis not present

## 2020-09-03 LAB — BASIC METABOLIC PANEL
Anion gap: 7 (ref 5–15)
BUN: <5 mg/dL — ABNORMAL LOW (ref 6–20)
CO2: 25 mmol/L (ref 22–32)
Calcium: 8.7 mg/dL — ABNORMAL LOW (ref 8.9–10.3)
Chloride: 105 mmol/L (ref 98–111)
Creatinine, Ser: 0.86 mg/dL (ref 0.44–1.00)
GFR, Estimated: >60 mL/min (ref >60)
Glucose, Bld: 92 mg/dL (ref 70–99)
Potassium: 3.7 mmol/L (ref 3.5–5.1)
Sodium: 137 mmol/L (ref 135–145)

## 2020-09-03 MED ORDER — SPIRONOLACTONE 25 MG PO TABS: 12.5000 mg | ORAL_TABLET | Freq: Every day | ORAL | 3 refills | Status: DC

## 2020-09-03 MED ORDER — TORSEMIDE 20 MG PO TABS: 20.0000 mg | ORAL_TABLET | Freq: Every day | ORAL | 3 refills | Status: DC

## 2020-09-03 NOTE — Patient Instructions (Signed)
RESTART Spironolactone 12.5 mg one half tab daily at bedtime RESTART Torsemide 20 mg, one tab daily  Labs today We will only contact you if something comes back abnormal or we need to make some changes. Otherwise no news is good news!  Repeat labs at follow up with Rebecca Eaton (09/06/20)  Your physician recommends that you schedule a follow-up appointment in: 2-4 weeks  in the Advanced Practitioners (PA/NP) Clinic   Do the following things EVERYDAY: 1) Weigh yourself in the morning before breakfast. Write it down and keep it in a log. 2) Take your medicines as prescribed 3) Eat low salt foods--Limit salt (sodium) to 2000 mg per day.  4) Stay as active as you can everyday 5) Limit all fluids for the day to less than 2 liters  At the [facility], you and your health needs are our priority. As part of our continuing mission to provide you with exceptional heart care, we have created designated Provider Care Teams. These Care Teams include your primary Cardiologist (physician) and Advanced Practice Providers (APPs- Physician Assistants and Nurse Practitioners) who all work together to provide you with the care you need, when you need it.   You may see any of the following providers on your designated Care Team at your next follow up: Marland Kitchen Dr Glori Bickers . Dr Loralie Champagne . Dr Vickki Muff . Darrick Grinder, NP . Lyda Jester, [place] . Audry Riles, PharmD   Please be sure to bring in all your medications bottles to every appointment.   If you have any questions or concerns before your next appointment please send [place] a message through [place] or call our office at [phone].    TO LEAVE A MESSAGE FOR THE NURSE SELECT OPTION 2, PLEASE LEAVE A MESSAGE INCLUDING: . YOUR NAME . DATE OF BIRTH . CALL BACK NUMBER . REASON FOR CALL**this is important as we prioritize the call backs  YOU WILL RECEIVE A CALL BACK THE SAME DAY AS LONG AS YOU CALL  BEFORE 4:00 PM Please see our updated No Show and Same Day Appointment Cancellation Policy attached to your AVS.

## 2020-09-03 NOTE — Telephone Encounter (Signed)
Scheduled appt per 4/11 sch msg. Pt aware.  

## 2020-09-03 NOTE — Progress Notes (Signed)
Advanced Heart Failure Clinic Note   Referring Physician: PCP: Hoyt Koch, MD PCP-Cardiologist: Sinclair Grooms, MD  [facility]: Dr. Haroldine Laws   Reason for Visit: F/u for Chronic Systolic Heart Failure  HPI:  [name] is a 45 y.o. 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   Was admitted 12/5-11/2016 with recurrent HF, URI, CP and elevated BP (160/110). CT chest no PE. Diuresed from 233-> 228 pounds.   Admitted 11/2017 with CP described as "pounding". Troponins were negative. Symptoms thought to be related to frequent PVCs. EP consulted and scheduled her for PVC ablation. Echo repeated 11/22/17 and showed improved EF 50-55%. HF team consulted and changed torsemide to 40 mg daily (from 3x/week).   Underwent PVC ablation 12/01/17. Was felt not to be successful.  Was on flecainide but discontinued due to reduced EF. Now on amiodarone.   Repeat Echo 10/2018 showed drop in EF back down to 25-30%. Was instructed to f/u in clinic but pt did not. She had outpatient monitor 04/2018 that showed rare PVCs.    Evaluated in the ED 04/11/20 w/ CC of chest pain. According to EDP documentation, pain had been present for 3-6 hrs prior to presenting to the ED. Described as central-left sided heaviness/pressure/tightness that was worse with exertion. No pleuritic pain or hypoxia to suggest acute PE. Hs Troponin normal x 2 (4>>5). EKG showed sinus tach 104 w/ frequent PVCs. Device interrogation was unremarkable. She was released from the ED and instructed to f/u in clinic.    Saw EP on 12/11 with suggestion for bb again.   Admitted 10/2019 with chest pain and shortness of breath. Viral panel negative. ECHO completed and showed  EF < 20% and normal RV. Had cath with preserved cardiac output. Plan was to f/u with EP regarding PVCs. She has remained on AAD therapy w/ amiodarone +  blocker therapy.  Recently [facility] to [facility] 4/22 w/ increased dyspnea and volume overload. Also w/ increased PVC burden. Echo showed EF 20-25%, RV normal. She was diuresed w/ IV Lasix and placed on amiodarone gtt for PVC suppression. Unfortunately was over diuresed, and was given back IVFs. BP however remained soft, and Entresto was discontinued. Torsemide was also discontinued. She was placed on Jardiance 10 mg at discharge.  blocker was changed from Coreg to Toprol XL 50 mg daily given low BP. EP was also consulted and recommended increasing PO amio to 200 mg bid at d/c w/ close EP clinic f/u.   She now presents to [facility] for post hospital f/u. She has f/u w/ EP on 4/15. Overall feeling better. No further syncope/ near syncope. Still notes palpitations, but doesn't think they are as bad. SBPs at home in the low 630Z-601U systolic. She does feel like she is still retaining fluid. She gets SOB walking up stairs and has orthopnea/ cough at night. Device interrogated. HL score is much improved since recent hospitalization, down from 48>>18 today but still elevated.    [employer] Score: 18 (goal is 6)  Cardiac Studies  Echo done 05/19/2018 by Dr Haroldine Laws --. EF back down to 20-25% - Bedside echo by Dr. Haroldine Laws with EF improved to 50%. Echo 6/20 - EF back down at 25-30%.   Cath 6/17 revealed normal  vessels, moderate elevation in pulmonary artery pressures, and LVEF 25-30%.  RHC 10/2019  RA = 4 RV = 32/6 PA = 31/8 (21) PCW = 12 Fick cardiac output/index = 7.0/3.4 Thermo CO/CI = 5.2/2.5 PVR =1.7 (Thermo) Ao sat = 98% PA sat = 67%, 69%  RHC 3/19: RA = 2 RV = 26/7 PA = 28/13 (19) PCW = 5 Fick cardiac output/index = 5.7/2.8 PVR = 2.5 WU Ao sat = 99% PA sat = 71%, 72% High SVC sat = 65%  RHC 6/17 RA 5 RV 35/6 PA 41/12 PW  11 Fick 6.4/3.0 PA sat 62%  Echo 6/15 35-40% Echo 6/17 25-30% Echo 12/17 20-25% Echo 8/18 EF 25% Echo 7/19: EF 50-55% Echo 6/20: EF 25-30% Echo 11/13/19: Ef < 20% RV normal  Echo 4/22: EF 20-25%, RV normal   CPX 2/18 FVC 2.57 (79%)    FEV1 2.14 (81%)     FEV1/FVC 83 (101%)     MVV 107 (102%) Resting HR: 106 Peak HR: 166  (93% age predicted max HR) BP rest: 136/98 BP peak: 174/88 Peak VO2: 17.1 (85% predicted peak VO2) - corrects to 28.4 for ibw VE/VCO2 slope: 33 OUES: 2.16 Peak RER: 1.09 Ventilatory Threshold: 14.6 (73% predicted or measured peak VO2) VE/MVV: 59% PETCO2 at peak: 33 O2pulse: 10  (83% predicted O2pulse)  CPX 12/18 pVO2 14.0 (corrects to 23.0 for IBW) VeVCO2  32 RER 1.0  Review of systems complete and found to be negative unless listed in HPI.       Past Medical History:  Diagnosis Date  . Acute on chronic systolic (congestive) heart failure ([place]) 04/29/2017  . Acute pain of right shoulder 06/16/2017  . AICD (automatic cardioverter/defibrillator) present   . Anxiety   . Arthritis    right shoulder   . Asthma   . CHF (congestive heart failure) ([place])   . Chronic combined systolic and diastolic heart failure ([place]) 03/05/2014  . Closed low lateral malleolus fracture 10/23/2013  . Cystitis 10/21/2017  . Depression   . Depression with anxiety 01/20/2013  . Diabetes mellitus without complication ([place])   . Diverticulosis   . Dyspnea    comes and goes intermittently mostly with exertion   . Essential hypertension    Prev followed by [facility]/ Cardiology   . Fibroid    age 67  . Gallstones   . Generalized abdominal cramping   . History of cardiomyopathy   . Hypertension   . IBS (irritable bowel syndrome)   . 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 Insomnia 05/07/2017  . Labral tear of shoulder 04/04/2015    Injected 04/04/2015 Injected 12/03/2015   . Migraine    "monthly" (08/03/2016)  .  Myofascial pain 06/16/2017  . NICM (nonischemic cardiomyopathy) ([place]) 08/03/2016  . Nonallopathic lesion of lumbosacral region 11/16/2016  . Nonallopathic lesion of sacral region 11/16/2016  . Nonallopathic lesion of thoracic region 08/20/2014  . Nonspecific chest pain 04/28/2017  . OSA (obstructive sleep apnea) 01/02/2013   NPSG 2009:  AHI 9/hr. CPAP intolerance >> "smothering" Good tolerance of auto device (optimal pressure 12-13 on download).  - referred to Dr Gwenette Greet    . OSA on CPAP   . Ovarian cyst    1999; surgically removed  . Patellofemoral syndrome of both knees 10/16/2016  . 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 PVC (premature ventricular contraction) 06/23/2016  . Seizures ([place])    "as a child" (08/03/2016)  . Termination of pregnancy  due to cardiac risk    Current Outpatient Medications  Medication Sig Dispense Refill  . albuterol (PROAIR HFA) 108 (90 Base) MCG/ACT inhaler Inhale 1-2 puffs into the lungs every 6 (six) hours as needed for wheezing or shortness of breath. 18 g 5  . amiodarone (PACERONE) 200 MG tablet Take 1 tablet (200 mg total) by mouth 2 (two) times daily. 60 tablet 2  . budesonide-formoterol (SYMBICORT) 160-4.5 MCG/ACT inhaler Inhale 2 puffs into the lungs 2 (two) times daily as needed. 30.6 each 3  . cyanocobalamin (,VITAMIN B-12,) 1000 MCG/ML injection Inject 1 mL (1,000 mcg total) into the muscle as directed. Once daily for 7 days, then once weekly for 4 weeks followed by once monthly 11 mL 1  . diclofenac (VOLTAREN) 75 MG EC tablet Take 1 tablet (75 mg total) by mouth 2 (two) times daily as needed. 30 tablet 0  . diclofenac Sodium (VOLTAREN) 1 % GEL Apply 4 g topically 4 (four) times daily. 300 g 6  . dicyclomine (BENTYL) 20 MG tablet Take 1 tablet (20 mg total) by mouth daily as needed for spasms. 90 tablet 3  . empagliflozin (JARDIANCE) 10 MG TABS tablet Take 1 tablet (10 mg total) by mouth daily. 30 tablet 3  . fluticasone (FLONASE)  50 MCG/ACT nasal spray Place 2 sprays into both nostrils daily as needed for allergies. 48 g 0  . gabapentin (NEURONTIN) 300 MG capsule Take 1 capsule (300 mg total) by mouth at bedtime as needed (back spasms). 90 capsule 3  . ivabradine (CORLANOR) 7.5 MG TABS tablet Take 1 tablet (7.5 mg total) by mouth 2 (two) times daily with a meal. 60 tablet 2  . lidocaine (LIDODERM) 5 % Place 1 patch onto the skin daily. Remove & Discard patch within 12 hours or as directed by MD 90 patch 3  . Magnesium Oxide 200 MG TABS Take 1 tablet (200 mg total) by mouth daily. 90 tablet 3  . metoprolol succinate (TOPROL-XL) 50 MG 24 hr tablet Take 1 tablet (50 mg total) by mouth daily. Take with or immediately following a meal. 30 tablet 3  . montelukast (SINGULAIR) 10 MG tablet Take 1 tablet (10 mg total) by mouth at bedtime. 30 tablet 3  . Multiple Vitamins-Minerals (MULTIVITAMIN GUMMIES WOMENS PO) Take 2 tablets by mouth at bedtime.     . nitroGLYCERIN (NITROSTAT) 0.4 MG SL tablet Place 1 tablet (0.4 mg total) under the tongue every 5 (five) minutes as needed for chest pain. 75 tablet 0  . omeprazole (PRILOSEC) 20 MG capsule Take 1 capsule (20 mg total) by mouth daily as needed (for reflux symptoms). 30 capsule 0  . spironolactone (ALDACTONE) 25 MG tablet Take 0.5 tablets (12.5 mg total) by mouth at bedtime. 90 tablet 3  . tiZANidine (ZANAFLEX) 4 MG tablet Take 4 mg by mouth every 8 (eight) hours as needed for muscle spasms.     Marland Kitchen torsemide (DEMADEX) 20 MG tablet Take 1 tablet (20 mg total) by mouth daily. 30 tablet 3  . Vitamin D, Ergocalciferol, (DRISDOL) 1.25 MG (50000 UNIT) CAPS capsule Take 1 capsule (50,000 Units total) by mouth every 7 (seven) days. 12 capsule 0  . ipratropium-albuterol (DUONEB) 0.5-2.5 (3) MG/3ML SOLN Take 3 mLs by nebulization every 6 (six) hours as needed. 360 mL 0  . sacubitril-valsartan (ENTRESTO) 97-103 MG Take 1 tablet by mouth 2 (two) times daily. HOLD until follow-up with cardiology  (Patient not taking: Reported on 09/03/2020) 60 tablet 2   No current  facility-administered medications for this encounter.    Allergies  Allergen Reactions  . Vancomycin Other (See Comments)    "did something to my kidneys," PROGRESSED TO KIDNEY FAILURE!!  Marland Kitchen Aspirin Other (See Comments)    Wheezing, (Pt states that she just wheezes some when she takes aspirin by itself but she can take aspirin in a combination product).  . Contrast Media [Iodinated Diagnostic Agents] Other (See Comments)    Multiple CT contrast studies done over 2 weeks caused ARF  . Avocado Itching and Swelling  . Ciprofloxacin Itching and Rash  . Farxiga [Dapagliflozin] Rash  . Sulfa Antibiotics Itching and Rash      Social History   Socioeconomic History  . Marital status: Married    Spouse name: Not on file  . Number of children: 1  . Years of education: Not on file  . Highest education level: Not on file  Occupational History  . Occupation: stay at home mom  Tobacco Use  . Smoking status: Never Smoker  . Smokeless tobacco: Never Used  Vaping Use  . Vaping Use: Never used  Substance and Sexual Activity  . Alcohol use: No  . Drug use: No  . Sexual activity: Yes  Other Topics Concern  . Not on file  Social History Narrative  . Not on file   Social Determinants of Health   Financial Resource Strain: Not on file  Food Insecurity: Not on file  Transportation Needs: Not on file  Physical Activity: Insufficiently Active  . Days of Exercise per Week: 2 days  . Minutes of Exercise per Session: 40 min  Stress: Not on file  Social Connections: Socially Isolated  . Frequency of Communication with Friends and Family: Twice a week  . Frequency of Social Gatherings with Friends and Family: Never  . Attends Religious Services: Never  . Active Member of Clubs or Organizations: No  . Attends Archivist Meetings: Never  . Marital Status: Married  Human resources officer Violence: Not on file       Family History  Problem Relation Age of Onset  . Emphysema Maternal Grandmother        smoked  . Heart disease Maternal Grandmother 26       MI  . Rheum arthritis Mother   . Allergies Daughter   . Colon cancer Neg Hx     Vitals:   09/03/20 1041  BP: (!) 148/98  Pulse: 66  SpO2: 99%  Weight: 104.5 kg (230 lb 6.4 oz)    PHYSICAL EXAM: General:  Well appearing young female. No respiratory difficulty HEENT: normal Neck: supple. JVD 10 cm. Carotids 2+ bilat; no bruits. No lymphadenopathy or thyromegaly appreciated. Cor: PMI nondisplaced. Irregular rhythm and rate. No rubs, gallops or murmurs. Lungs: clear Abdomen: soft, nontender, nondistended. No hepatosplenomegaly. No bruits or masses. Good bowel sounds. Extremities: no cyanosis, clubbing, rash, edema Neuro: alert & oriented x 3, cranial nerves grossly intact. moves all 4 extremities w/o difficulty. Affect pleasant.   ECG: not performed    ASSESSMENT & PLAN:  1. Chronic systolic HF:  due to peri-partum CM, onset 1999. S/P [employer] ICD 07/2016 - EF 20-25% (12/17).  - Echo 8/18 EF 25-30% s/p ICD 3/18. [place] 3/19: RA 2, PA 28/13, Fick CO 5.7, CI 2.8 - CPX 12/18 showed moderate limitation due mostly to obesity with some HF component - Echo 11/22/17 LVEF 50-55%, Grade 1 DD, Mild MR, Mod LAE - Echo 04/2018 by Dr Haroldine Laws EF 20-25%. RV ok -  Echo 10/2019 EF < 20%. - Echo 08/2020 EF 20-25%, RV normal  - Recent Admit per above, torsemide, spiro and Entresto held at d/c due to low BP  - NYHA III. Volume overloaded on exam. HL score trending down since recent hospital admission but remains elevated at 18. BP is better but SBPs at home remain low 100s. Afraid she may not tolerate restart of Entresto just yet - Continue Jardiance 10 mg (tolerating ok, no GU SEs) - Restart Spiro 12.5 mg qhs - Restart Torsemide 20 mg daily   - Continue Toprol XL 50 mg daily    - Off Entresto w/ low BP  - Continue corlanor 7.5mg  BID for now.  -  Check BMP today and again at EP f/u on 4/15    2. Frequent PVCs/ Bigeminy - Holter Monitor 11/09/17 with 30% PVCs with one primary morphology.  - S/p PVC ablation 11/2017 with Dr Lovena Le unsuccessful - Off flecainide with EF down and persistent PVCs. Changed to amio - Zio Patch 04/2019 > 17% PVCs and >8% couplets.  - Zio Patch returned on 11/28/2019  - Amio recently increased back to 200 mg bid given increased burden  - She has f/u w/ EP later this week - I worry about her young age and long term use of amiodarone. Recent TSH and HFTs were ok. She gets annual eye exams. If amio is to be continued long term, she will need more frequent monitoring of PFTs. Her last PFTs were in 2014. Will order PFTs w/ DLCO. We should monitor this yearly.     3. Morbid obesity - s/p Gastric Sleeve at [facility] 09/20/17  - Body mass index is 38.34 kg/m.   4. Daytime Fatigue - Sleep study in 2019 and again in 2021 were both negative for sleep apnea    5. DM2 - well controlled, recent Hgb A1c 5.5 - now on Jardiance for concomitant HF    F/u in  4 weeks w/ APP   Lyda Jester, PA-C 09/03/20

## 2020-09-05 ENCOUNTER — Encounter: Payer: HMO | Admitting: Student

## 2020-09-06 ENCOUNTER — Inpatient Hospital Stay: Payer: HMO

## 2020-09-06 ENCOUNTER — Ambulatory Visit (INDEPENDENT_AMBULATORY_CARE_PROVIDER_SITE_OTHER): Payer: HMO | Admitting: Student

## 2020-09-06 ENCOUNTER — Encounter: Payer: Self-pay | Admitting: Student

## 2020-09-06 ENCOUNTER — Other Ambulatory Visit (HOSPITAL_COMMUNITY)
Admission: RE | Admit: 2020-09-06 | Discharge: 2020-09-06 | Disposition: A | Discharge status: Home / Self Care | Payer: HMO | Source: Ambulatory Visit | Attending: Cardiology | Admitting: Cardiology

## 2020-09-06 ENCOUNTER — Other Ambulatory Visit: Payer: Self-pay

## 2020-09-06 VITALS — BP 120/72 | HR 70 | Ht 65.0 in | Wt 227.0 lb

## 2020-09-06 VITALS — BP 126/81 | HR 66 | Temp 97.5°F | Resp 18

## 2020-09-06 DIAGNOSIS — D500 Iron deficiency anemia secondary to blood loss (chronic): Secondary | ICD-10-CM | POA: Diagnosis not present

## 2020-09-06 DIAGNOSIS — I5022 Chronic systolic (congestive) heart failure: Secondary | ICD-10-CM

## 2020-09-06 DIAGNOSIS — I493 Ventricular premature depolarization: Secondary | ICD-10-CM | POA: Diagnosis not present

## 2020-09-06 DIAGNOSIS — Z20822 Contact with and (suspected) exposure to covid-19: Secondary | ICD-10-CM | POA: Insufficient documentation

## 2020-09-06 DIAGNOSIS — Z95810 Presence of automatic (implantable) cardiac defibrillator: Secondary | ICD-10-CM

## 2020-09-06 DIAGNOSIS — Z01812 Encounter for preprocedural laboratory examination: Secondary | ICD-10-CM | POA: Insufficient documentation

## 2020-09-06 DIAGNOSIS — I5042 Chronic combined systolic (congestive) and diastolic (congestive) heart failure: Secondary | ICD-10-CM

## 2020-09-06 LAB — CUP PACEART INCLINIC DEVICE CHECK
Date Time Interrogation Session: 20220415092712
HighPow Impedance: 66 Ohm
HighPow Impedance: 66 Ohm
Implantable Lead Implant Date: 20180312
Implantable Lead Location: 753860
Implantable Lead Model: 293
Implantable Lead Serial Number: 422842
Implantable Pulse Generator Implant Date: 20180312
Lead Channel Impedance Value: 561 Ohm
Lead Channel Impedance Value: 561 Ohm
Lead Channel Pacing Threshold Amplitude: 1.4 V
Lead Channel Pacing Threshold Amplitude: 1.4 V
Lead Channel Pacing Threshold Pulse Width: 0.7 ms
Lead Channel Pacing Threshold Pulse Width: 0.7 ms
Lead Channel Sensing Intrinsic Amplitude: 12.7 mV
Lead Channel Sensing Intrinsic Amplitude: 12.7 mV
Lead Channel Setting Pacing Amplitude: 2.5 V
Lead Channel Setting Pacing Pulse Width: 0.4 ms
Lead Channel Setting Sensing Sensitivity: 0.5 mV
Pulse Gen Serial Number: 226601

## 2020-09-06 LAB — SARS CORONAVIRUS 2 (TAT 6-24 HRS): SARS Coronavirus 2: NEGATIVE

## 2020-09-06 MED ORDER — SODIUM CHLORIDE 0.9 % IV SOLN
200.0000 mg | Freq: Once | INTRAVENOUS | Status: AC
  Administered 2020-09-06: 200 mg via INTRAVENOUS
  Filled 2020-09-06: qty 200

## 2020-09-06 MED ORDER — SODIUM CHLORIDE 0.9 % IV SOLN
Freq: Once | INTRAVENOUS | Status: AC
  Filled 2020-09-06: qty 250

## 2020-09-06 NOTE — Patient Instructions (Signed)
Medication Instructions:  Your physician recommends that you continue on your current medications as directed. Please refer to the Current Medication list given to you today.  *If you need a refill on your cardiac medications before your next appointment, please call your pharmacy*   Lab Work: TODAY: BMET, BNP  If you have labs (blood work) drawn today and your tests are completely normal, you will receive your results only by: Marland Kitchen MyChart Message (if you have MyChart) OR . A paper copy in the mail If you have any lab test that is abnormal or we need to change your treatment, we will call you to review the results.   Follow-Up: At [facility], you and your health needs are our priority.  As part of our continuing mission to provide you with exceptional heart care, we have created designated Provider Care Teams.  These Care Teams include your primary Cardiologist (physician) and Advanced Practice Providers (APPs -  Physician Assistants and Nurse Practitioners) who all work together to provide you with the care you need, when you need it.   Your next appointment:   6 month(s)  The format for your next appointment:   In Person  Provider:   Legrand Como "Oda Kilts, PA-C

## 2020-09-06 NOTE — Patient Instructions (Signed)

## 2020-09-06 NOTE — Progress Notes (Signed)
Electrophysiology Office Note Date: 09/06/2020  ID:  ILYNN [name], DOB 24-Feb-1976, MRN [id number]  PCP: Hoyt Koch, MD Primary Cardiologist: Sinclair Grooms, MD Electrophysiologist: Cristopher Peru, MD   CC: Routine ICD follow-up  [name] is a 45 y.o. female seen today for Cristopher Peru, MD for post hospital follow up.  Since discharge from hospital the patient reports doing OK. Having less palpitations on higher dose amiodarone. She remains off Entresto for the time being due to borderline hypotension. She has mild SOB with moderate activity. Does most of her ADLs OK. She does have fatigue. She feels like her 1 yr grandmother is able to be more active than she is, despite having similar Ejection Fractions. she denies chest pain, PND, orthopnea, nausea, vomiting, dizziness, syncope, edema, weight gain, or early satiety.   Device History: Medtronic Single Chamber ICD implanted 2018 for NICM  Past Medical History:  Diagnosis Date  . Acute on chronic systolic (congestive) heart failure ([place]) 04/29/2017  . Acute pain of right shoulder 06/16/2017  . AICD (automatic cardioverter/defibrillator) present   . Anxiety   . Arthritis    right shoulder   . Asthma   . CHF (congestive heart failure) ([place])   . Chronic combined systolic and diastolic heart failure ([place]) 03/05/2014  . Closed low lateral malleolus fracture 10/23/2013  . Cystitis 10/21/2017  . Depression   . Depression with anxiety 01/20/2013  . Diabetes mellitus without complication ([place])   . Diverticulosis   . Dyspnea    comes and goes intermittently mostly with exertion   . Essential hypertension    Prev followed by [facility]/ Cardiology   . Fibroid    age 70  . Gallstones   . Generalized abdominal cramping   . History of cardiomyopathy   . Hypertension   . IBS (irritable bowel syndrome)   . 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 Insomnia 05/07/2017  . Labral tear of  shoulder 04/04/2015    Injected 04/04/2015 Injected 12/03/2015   . Migraine    "monthly" (08/03/2016)  . Myofascial pain 06/16/2017  . NICM (nonischemic cardiomyopathy) ([place]) 08/03/2016  . Nonallopathic lesion of lumbosacral region 11/16/2016  . Nonallopathic lesion of sacral region 11/16/2016  . Nonallopathic lesion of thoracic region 08/20/2014  . Nonspecific chest pain 04/28/2017  . OSA (obstructive sleep apnea) 01/02/2013   NPSG 2009:  AHI 9/hr. CPAP intolerance >> "smothering" Good tolerance of auto device (optimal pressure 12-13 on download).  - referred to Dr Gwenette Greet    . OSA on CPAP   . Ovarian cyst    1999; surgically removed  . Patellofemoral syndrome of both knees 10/16/2016  . 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 PVC (premature ventricular contraction) 06/23/2016  . Seizures ([place])    "as a child" (08/03/2016)  . Termination of pregnancy    due to cardiac risk   Past Surgical History:  Procedure Laterality Date  . CARDIAC CATHETERIZATION N/A 11/11/2015   Procedure: Right/Left Heart Cath and Coronary Angiography;  Surgeon: Troy Sine, MD;  Location: [place] [facility];  Service: Cardiovascular;  Laterality: N/A;  . CARDIAC CATHETERIZATION  ~ 2015  . CARDIAC DEFIBRILLATOR PLACEMENT  08/03/2016  . CESAREAN SECTION  1999  . COLONOSCOPY WITH PROPOFOL N/A 04/21/2016   Procedure: COLONOSCOPY WITH PROPOFOL;  Surgeon: Jerene Bears, MD;  Location: [facility];  Service: Gastroenterology;  Laterality: N/A;  . ESOPHAGOGASTRODUODENOSCOPY (EGD) WITH PROPOFOL N/A 04/21/2016  Procedure: ESOPHAGOGASTRODUODENOSCOPY (EGD) WITH PROPOFOL;  Surgeon: Jerene Bears, MD;  Location: [facility];  Service: Gastroenterology;  Laterality: N/A;  . FOOT FRACTURE SURGERY Right ~ 2003  . FRACTURE SURGERY    . ICD IMPLANT N/A 08/03/2016   Procedure: ICD Implant;  Surgeon: Deboraha Sprang, MD;  Location: [place] [facility];  Service: Cardiovascular;  Laterality: N/A;  . LAPAROSCOPIC  CHOLECYSTECTOMY  12/2006  . LAPAROSCOPIC GASTRIC SLEEVE RESECTION    . LAPAROSCOPY ABDOMEN DIAGNOSTIC  2008   "cut bile duct w/gallbladder OR; had to go in later & fix leak; hospitalized for 2 months"  . LEFT HEART CATHETERIZATION WITH CORONARY ANGIOGRAM N/A 02/26/2014   Procedure: LEFT HEART CATHETERIZATION WITH CORONARY ANGIOGRAM;  Surgeon: Jettie Booze, MD;  Location: [facility] CATH LAB;  Service: Cardiovascular;  Laterality: N/A;  . OVARIAN CYST REMOVAL Right 1999  . PVC ABLATION N/A 12/01/2017   Procedure: PVC ABLATION;  Surgeon: Evans Lance, MD;  Location: [place] CV LAB;  Service: Cardiovascular;  Laterality: N/A;  . RIGHT HEART CATH N/A 08/05/2017   Procedure: RIGHT HEART CATH;  Surgeon: Jolaine Artist, MD;  Location: [place] [facility] LAB;  Service: Cardiovascular;  Laterality: N/A;  . RIGHT HEART CATH N/A 11/16/2019   Procedure: RIGHT HEART CATH;  Surgeon: Jolaine Artist, MD;  Location: [place] [facility];  Service: Cardiovascular;  Laterality: N/A;  . TUBAL LIGATION  1999    Current Outpatient Medications  Medication Sig Dispense Refill  . albuterol (PROAIR HFA) 108 (90 Base) MCG/ACT inhaler Inhale 1-2 puffs into the lungs every 6 (six) hours as needed for wheezing or shortness of breath. 18 g 5  . amiodarone (PACERONE) 200 MG tablet Take 1 tablet (200 mg total) by mouth 2 (two) times daily. 60 tablet 2  . budesonide-formoterol (SYMBICORT) 160-4.5 MCG/ACT inhaler Inhale 2 puffs into the lungs 2 (two) times daily as needed. 30.6 each 3  . cyanocobalamin (,VITAMIN B-12,) 1000 MCG/ML injection Inject 1 mL (1,000 mcg total) into the muscle as directed. Once daily for 7 days, then once weekly for 4 weeks followed by once monthly 11 mL 1  . diclofenac (VOLTAREN) 75 MG EC tablet Take 1 tablet (75 mg total) by mouth 2 (two) times daily as needed. 30 tablet 0  . diclofenac Sodium (VOLTAREN) 1 % GEL Apply 4 g topically 4 (four) times daily. 300 g 6  . dicyclomine (BENTYL) 20 MG  tablet Take 1 tablet (20 mg total) by mouth daily as needed for spasms. 90 tablet 3  . empagliflozin (JARDIANCE) 10 MG TABS tablet Take 1 tablet (10 mg total) by mouth daily. 30 tablet 3  . fluticasone (FLONASE) 50 MCG/ACT nasal spray Place 2 sprays into both nostrils daily as needed for allergies. 48 g 0  . gabapentin (NEURONTIN) 300 MG capsule Take 1 capsule (300 mg total) by mouth at bedtime as needed (back spasms). 90 capsule 3  . ivabradine (CORLANOR) 7.5 MG TABS tablet Take 1 tablet (7.5 mg total) by mouth 2 (two) times daily with a meal. 60 tablet 2  . lidocaine (LIDODERM) 5 % Place 1 patch onto the skin daily. Remove & Discard patch within 12 hours or as directed by MD 90 patch 3  . Magnesium Oxide 200 MG TABS Take 1 tablet (200 mg total) by mouth daily. 90 tablet 3  . metoprolol succinate (TOPROL-XL) 50 MG 24 hr tablet Take 1 tablet (50 mg total) by mouth daily. Take with or immediately following a  meal. 30 tablet 3  . montelukast (SINGULAIR) 10 MG tablet Take 1 tablet (10 mg total) by mouth at bedtime. 30 tablet 3  . Multiple Vitamins-Minerals (MULTIVITAMIN GUMMIES WOMENS PO) Take 2 tablets by mouth at bedtime.     . nitroGLYCERIN (NITROSTAT) 0.4 MG SL tablet Place 1 tablet (0.4 mg total) under the tongue every 5 (five) minutes as needed for chest pain. 75 tablet 0  . omeprazole (PRILOSEC) 20 MG capsule Take 1 capsule (20 mg total) by mouth daily as needed (for reflux symptoms). 30 capsule 0  . sacubitril-valsartan (ENTRESTO) 97-103 MG Take 1 tablet by mouth 2 (two) times daily. HOLD until follow-up with cardiology 60 tablet 2  . spironolactone (ALDACTONE) 25 MG tablet Take 0.5 tablets (12.5 mg total) by mouth at bedtime. 90 tablet 3  . tiZANidine (ZANAFLEX) 4 MG tablet Take 4 mg by mouth every 8 (eight) hours as needed for muscle spasms.     Marland Kitchen torsemide (DEMADEX) 20 MG tablet Take 1 tablet (20 mg total) by mouth daily. (Patient taking differently: Take 20 mg by mouth daily. HOLD until  cardiology visit) 30 tablet 3  . Vitamin D, Ergocalciferol, (DRISDOL) 1.25 MG (50000 UNIT) CAPS capsule Take 1 capsule (50,000 Units total) by mouth every 7 (seven) days. 12 capsule 0  . ipratropium-albuterol (DUONEB) 0.5-2.5 (3) MG/3ML SOLN Take 3 mLs by nebulization every 6 (six) hours as needed. 360 mL 0   No current facility-administered medications for this visit.    Allergies:   Vancomycin, Aspirin, Contrast media [iodinated diagnostic agents], Avocado, Cyclobenzaprine, Oxycodone, Ciprofloxacin, Farxiga [dapagliflozin], and Sulfa antibiotics   Social History: Social History   Socioeconomic History  . Marital status: Married    Spouse name: Not on file  . Number of children: 1  . Years of education: Not on file  . Highest education level: Not on file  Occupational History  . Occupation: stay at home mom  Tobacco Use  . Smoking status: Never Smoker  . Smokeless tobacco: Never Used  Vaping Use  . Vaping Use: Never used  Substance and Sexual Activity  . Alcohol use: No  . Drug use: No  . Sexual activity: Yes  Other Topics Concern  . Not on file  Social History Narrative  . Not on file   Social Determinants of Health   Financial Resource Strain: Not on file  Food Insecurity: Not on file  Transportation Needs: Not on file  Physical Activity: Insufficiently Active  . Days of Exercise per Week: 2 days  . Minutes of Exercise per Session: 40 min  Stress: Not on file  Social Connections: Socially Isolated  . Frequency of Communication with Friends and Family: Twice a week  . Frequency of Social Gatherings with Friends and Family: Never  . Attends Religious Services: Never  . Active Member of Clubs or Organizations: No  . Attends Archivist Meetings: Never  . Marital Status: Married  Human resources officer Violence: Not on file    Family History: Family History  Problem Relation Age of Onset  . Emphysema Maternal Grandmother        smoked  . Heart disease  Maternal Grandmother 21       MI  . Rheum arthritis Mother   . Allergies Daughter   . Colon cancer Neg Hx     Review of Systems: All other systems reviewed and are otherwise negative except as noted above.   Physical Exam: Vitals:   09/06/20 0858  BP: 120/72  Pulse:  70  SpO2: 98%  Weight: 227 lb (103 kg)  Height: 5\' 5"  (1.651 m)     GEN- The patient is well appearing, alert and oriented x 3 today.   HEENT: normocephalic, atraumatic; sclera clear, conjunctiva pink; hearing intact; oropharynx clear; neck supple, no JVP Lymph- no cervical lymphadenopathy Lungs- Clear to ausculation bilaterally, normal work of breathing.  No wheezes, rales, rhonchi Heart- Regular rate and rhythm, no murmurs, rubs or gallops, PMI not laterally displaced GI- soft, non-tender, non-distended, bowel sounds present, no hepatosplenomegaly Extremities- no clubbing or cyanosis. No edema; DP/PT/radial pulses 2+ bilaterally MS- no significant deformity or atrophy Skin- warm and dry, no rash or lesion; ICD pocket well healed Psych- euthymic mood, full affect Neuro- strength and sensation are intact  ICD interrogation- reviewed in detail today,  See PACEART report  EKG:  EKG is not ordered today. The ekg ordered 08/25/20 shows Sinus tachycardia at 101 bpm with intrinsic QRS of 86 ms, but frequent PVCs in a pattern of bigeminy.   Recent Labs: 08/25/2020: B Natriuretic Peptide 301.6 08/26/2020: ALT 15; Hemoglobin 10.1; Magnesium 2.1; Platelets 307; TSH 1.732 09/03/2020: BUN <5; Creatinine, Ser 0.86; Potassium 3.7; Sodium 137   Wt Readings from Last 3 Encounters:  09/06/20 227 lb (103 kg)  09/03/20 230 lb 6.4 oz (104.5 kg)  08/27/20 224 lb 6.9 oz (101.8 kg)     Other studies Reviewed: Additional studies/ records that were reviewed today include: Previous EP and HF notes, recent admission notes.    Assessment and Plan:  1.  Chronic systolic dysfunction s/p [employer] single chamber ICD  euvolemic  today Stable on an appropriate medical regimen, that is being gradually resumed after recent admission.  Normal ICD function See Pace Art report No changes today NYHA II-III symptoms  2. Frequent PVCs Continue amiodarone 200 mg BID and eventually plan to trend back down. She is still recovering from recent admission and not yet back on all of her GDMT.  Discussed with Dr. Lovena Le. Due to the location of her PVCs (see op note) she is not a candidate for re-do ablation. Could consider referral to tertiary center, but success rate is not any more likely. Pt verbalizes understanding.  Optimize GDMT. We also recently switched BB from coreg to see if a more selective BB would provide any benefit.   [name]'s heart failure has failed to improve despite titration of guideline directed medication such that he qualifies for the [facility] NEO device. The following information from the patient's medical record supports the medical necessity of this procedure for my patient, consistent with the FDA on-label indication for BAROSTIM NEO:  ? LVEF of 20-25%  confirmed by Echo on 08/26/20   ? NT-proBNP of <1600 pg/ml  = Labs to be drawn today, 09/06/20  ?Symptomatic despite medication management of: diuretic, beta blocker, ACEi/ARB/ARNi, Aldosterone inhibitor and SGLT2 inhibitor as evidenced by symptoms below.   ? This patients signs and symptoms of heart failure include "dyspnea with mild to moderate exertion, fatigue and weakness   ? NYHA Congestive Heart Failure Classification: III  ? Recent hospitalization for Heart Failure on 08/2020   This patient is NOT indicated for cardiac resynchronization therapy because QRS = 86 by EKG on 08/25/20    Current medicines are reviewed at length with the patient today.   The patient does not have concerns regarding her medicines.  The following changes were made today:  none  Labs/ tests ordered today include:  Orders Placed This Encounter  Procedures  .  Basic metabolic panel  . Pro b natriuretic peptide (BNP)  . CUP PACEART INCLINIC DEVICE CHECK   Disposition:   Follow up with EP APP  6 months; Sooner pending Barostim work up.   Jacalyn Lefevre, PA-C  09/06/2020 10:17 AM  [facility] [address] [place] [address] [phone] (office) [phone] (fax)

## 2020-09-07 LAB — BASIC METABOLIC PANEL
BUN/Creatinine Ratio: 10 (ref 9–23)
BUN: 9 mg/dL (ref 6–24)
CO2: 24 mmol/L (ref 20–29)
Calcium: 9 mg/dL (ref 8.7–10.2)
Chloride: 102 mmol/L (ref 96–106)
Creatinine, Ser: 0.89 mg/dL (ref 0.57–1.00)
Glucose: 93 mg/dL (ref 65–99)
Potassium: 4.3 mmol/L (ref 3.5–5.2)
Sodium: 140 mmol/L (ref 134–144)
eGFR: 81 mL/min/{1.73_m2} (ref >59)

## 2020-09-07 LAB — PRO B NATRIURETIC PEPTIDE: NT-Pro BNP: 403 pg/mL — ABNORMAL HIGH (ref 0–249)

## 2020-09-09 ENCOUNTER — Ambulatory Visit (HOSPITAL_COMMUNITY)
Admission: RE | Admit: 2020-09-09 | Discharge: 2020-09-09 | Disposition: A | Discharge status: Home / Self Care | Payer: HMO | Source: Ambulatory Visit | Attending: Cardiology | Admitting: Cardiology

## 2020-09-09 ENCOUNTER — Ambulatory Visit (INDEPENDENT_AMBULATORY_CARE_PROVIDER_SITE_OTHER): Payer: HMO

## 2020-09-09 ENCOUNTER — Other Ambulatory Visit (HOSPITAL_COMMUNITY): Payer: Self-pay

## 2020-09-09 ENCOUNTER — Telehealth (HOSPITAL_COMMUNITY): Payer: Self-pay | Admitting: Pharmacy Technician

## 2020-09-09 ENCOUNTER — Other Ambulatory Visit: Payer: Self-pay

## 2020-09-09 DIAGNOSIS — Z79899 Other long term (current) drug therapy: Secondary | ICD-10-CM | POA: Diagnosis not present

## 2020-09-09 DIAGNOSIS — Z5181 Encounter for therapeutic drug level monitoring: Secondary | ICD-10-CM | POA: Diagnosis not present

## 2020-09-09 DIAGNOSIS — I5042 Chronic combined systolic (congestive) and diastolic (congestive) heart failure: Secondary | ICD-10-CM

## 2020-09-09 DIAGNOSIS — M545 Low back pain, unspecified: Secondary | ICD-10-CM | POA: Insufficient documentation

## 2020-09-09 DIAGNOSIS — G8929 Other chronic pain: Secondary | ICD-10-CM | POA: Diagnosis not present

## 2020-09-09 DIAGNOSIS — Z95810 Presence of automatic (implantable) cardiac defibrillator: Secondary | ICD-10-CM | POA: Diagnosis not present

## 2020-09-09 LAB — PULMONARY FUNCTION TEST
DL/VA % pred: 117 %
DL/VA: 5.06 ml/min/mmHg/L
DLCO unc % pred: 80 %
DLCO unc: 17.88 ml/min/mmHg
FEF 25-75 Pre: 3.21 L/sec
FEF2575-%Pred-Pre: 120 %
FEV1-%Pred-Pre: 81 %
FEV1-Pre: 2.04 L
FEV1FVC-%Pred-Pre: 107 %
FEV6-%Pred-Pre: 75 %
FEV6-Pre: 2.29 L
FEV6FVC-%Pred-Pre: 102 %
FVC-%Pred-Pre: 74 %
FVC-Pre: 2.32 L
Pre FEV1/FVC ratio: 88 %
Pre FEV6/FVC Ratio: 100 %
RV % pred: 64 %
RV: 1.13 L
TLC % pred: 70 %
TLC: 3.69 L

## 2020-09-09 NOTE — Telephone Encounter (Signed)
Advanced Heart Failure Patient Advocate Encounter  Prior Authorization for Corlanor has been approved.    PA#  [id number] Effective dates: 09/02/20 through 09/02/21  Patients co-pay is $0  Charlann Boxer, CPhT

## 2020-09-10 ENCOUNTER — Encounter (HOSPITAL_COMMUNITY): Payer: HMO

## 2020-09-11 ENCOUNTER — Telehealth: Payer: Self-pay

## 2020-09-11 NOTE — Telephone Encounter (Signed)
Remote ICM transmission received.  Attempted call to patient regarding ICM remote transmission and no answer.  

## 2020-09-11 NOTE — Progress Notes (Signed)
EPIC Encounter for ICM Monitoring  Patient Name: [name] is a 45 y.o. female Date: 09/11/2020 Primary Care Physican: Hoyt Koch, MD Primary Cardiologist:Bensimhon Electrophysiologist: Caryl Comes 4/12/2022OfficeWeight: 230lbs  Attempted call to patient and unable to reach.   Transmission reviewed.  Pt diuresed during Hospitalization 08/25/2020.  HeartlogicHFIndex3 suggesting normal fluid levels.  Prescribed:  Torsemide20 mgTake1tablet (20 mg total) by mouthdaily.  Potassium10 mEq take4tablets(40 mEq total)twice a day.  Labs: 09/06/2020 Creatinine 0.89, BUN 9, Potassium 4.3, Sodium 140 09/03/2020 Creatinine 0.86, BUN <5, Potassium 3.7, Sodium 137, GFR >60  08/26/2020 Creatinine 0.89, BUN 11, Potassium 4.7, Sodium 136, GFR >60  08/25/2020 Creatinine 0.69, BUN 8, Potassium 3.8, Sodium 136, GFR >60  08/06/2020 Creatinine 0.87, BUN 17, Potassium 4.0, Sodium 143, GFR >60  07/29/2020 Creatinine 0.72, BUN 10, Potassium 3.5, Sodium 142  A complete set of results can be found in Results Review.  Recommendations: Unable to reach.    Follow-up plan: ICM clinic phone appointment on5/06/2020 to recheck fluid levels.91 day device clinic remote transmission4/26/2022.   EP/Cardiology next office visit: 10/03/2020 with HF clinic NP/PA   Copy of ICM check sent to Dr. Caryl Comes.  3 Month Trend    8 Day Data Trend          [name], RN 09/11/2020 4:49 PM

## 2020-09-17 ENCOUNTER — Ambulatory Visit (INDEPENDENT_AMBULATORY_CARE_PROVIDER_SITE_OTHER): Payer: HMO

## 2020-09-17 DIAGNOSIS — I428 Other cardiomyopathies: Secondary | ICD-10-CM | POA: Diagnosis not present

## 2020-09-17 LAB — CUP PACEART REMOTE DEVICE CHECK
Battery Remaining Longevity: 126 mo
Battery Remaining Percentage: 90 %
Brady Statistic RV Percent Paced: 0 %
Date Time Interrogation Session: 20220426050400
HighPow Impedance: 73 Ohm
Implantable Lead Implant Date: 20180312
Implantable Lead Location: 753860
Implantable Lead Model: 293
Implantable Lead Serial Number: 422842
Implantable Pulse Generator Implant Date: 20180312
Lead Channel Impedance Value: 593 Ohm
Lead Channel Setting Pacing Amplitude: 2.5 V
Lead Channel Setting Pacing Pulse Width: 0.4 ms
Lead Channel Setting Sensing Sensitivity: 0.5 mV
Pulse Gen Serial Number: 226601

## 2020-09-23 ENCOUNTER — Ambulatory Visit (INDEPENDENT_AMBULATORY_CARE_PROVIDER_SITE_OTHER): Payer: HMO

## 2020-09-23 DIAGNOSIS — Z95810 Presence of automatic (implantable) cardiac defibrillator: Secondary | ICD-10-CM

## 2020-09-23 DIAGNOSIS — I5042 Chronic combined systolic (congestive) and diastolic (congestive) heart failure: Secondary | ICD-10-CM

## 2020-09-24 NOTE — Progress Notes (Signed)
EPIC Encounter for ICM Monitoring  Patient Name: [name] is a 45 y.o. female Date: 09/24/2020 Primary Care Physican: Hoyt Koch, MD Primary Cardiologist:Bensimhon Electrophysiologist: Caryl Comes 4/12/2022OfficeWeight: 230lbs  Attempted call to patient and unable to reach.  Left detailed message per DPR regarding transmission. Transmission reviewed.   HeartlogicHFIndex0suggesting normalfluid levels.  Prescribed:  Torsemide20 mgTake1tablet (20 mg total) by mouthdaily.  Potassium10 mEq take4tablets(40 mEq total)twice a day.  Labs: 09/06/2020 Creatinine 0.89, BUN 9, Potassium 4.3, Sodium 140 09/03/2020 Creatinine 0.86, BUN <5, Potassium 3.7, Sodium 137, GFR >60  08/26/2020 Creatinine 0.89, BUN 11, Potassium 4.7, Sodium 136, GFR >60  08/25/2020 Creatinine 0.69, BUN 8, Potassium 3.8, Sodium 136, GFR >60  08/06/2020 Creatinine 0.87, BUN 17, Potassium 4.0, Sodium 143, GFR >60  07/29/2020 Creatinine 0.72, BUN 10, Potassium 3.5, Sodium 142  A complete set of results can be found in Results Review.  Recommendations:Left voice mail with ICM number and encouraged to call if experiencing any fluid symptoms.  Follow-up plan: ICM clinic phone appointment on5/23/2022.91 day device clinic remote transmission7/26/2022.   EP/Cardiology next office visit: 10/03/2020 with HF clinic NP/PA   Copy of ICM check sent to Dr. Caryl Comes.  3 Month Trend    8 Day Data Trend          [name], RN 09/24/2020 7:59 AM

## 2020-09-25 ENCOUNTER — Encounter: Payer: Self-pay | Admitting: Hematology and Oncology

## 2020-09-26 ENCOUNTER — Other Ambulatory Visit: Payer: Self-pay

## 2020-09-26 ENCOUNTER — Other Ambulatory Visit: Payer: Self-pay | Admitting: Hematology and Oncology

## 2020-09-26 ENCOUNTER — Other Ambulatory Visit (HOSPITAL_COMMUNITY): Payer: Self-pay

## 2020-09-26 MED ORDER — "BD LUER-LOK SYRINGE 22G X 1"" 3 ML MISC"
1.0000 | Freq: Once | 0 refills | Status: AC
  Filled 2020-09-26: qty 50, 30d supply, fill #0

## 2020-09-27 ENCOUNTER — Other Ambulatory Visit: Payer: Self-pay | Admitting: Hematology and Oncology

## 2020-09-27 ENCOUNTER — Other Ambulatory Visit (HOSPITAL_COMMUNITY): Payer: Self-pay

## 2020-09-27 MED ORDER — "SYRINGE/NEEDLE (DISP) 25G X 1"" 3 ML MISC"
1.0000 | Freq: Once | 0 refills | Status: AC
  Filled 2020-09-27: qty 25, 30d supply, fill #0
  Filled 2020-09-30: qty 25, 720d supply, fill #0

## 2020-09-28 ENCOUNTER — Other Ambulatory Visit (HOSPITAL_COMMUNITY): Payer: Self-pay

## 2020-09-30 ENCOUNTER — Other Ambulatory Visit (HOSPITAL_COMMUNITY): Payer: Self-pay

## 2020-10-01 ENCOUNTER — Other Ambulatory Visit (HOSPITAL_COMMUNITY): Payer: Self-pay

## 2020-10-02 NOTE — Addendum Note (Signed)
Encounter addended by: Micki Riley, RN on: 10/02/2020 4:35 PM  Actions taken: Imaging Exam ended

## 2020-10-03 ENCOUNTER — Other Ambulatory Visit: Payer: Self-pay | Admitting: *Deleted

## 2020-10-03 ENCOUNTER — Ambulatory Visit (HOSPITAL_COMMUNITY)
Admission: RE | Admit: 2020-10-03 | Discharge: 2020-10-03 | Disposition: A | Discharge status: Home / Self Care | Payer: HMO | Source: Ambulatory Visit | Attending: Cardiology | Admitting: Cardiology

## 2020-10-03 ENCOUNTER — Encounter (HOSPITAL_COMMUNITY): Payer: Self-pay

## 2020-10-03 ENCOUNTER — Ambulatory Visit (HOSPITAL_BASED_OUTPATIENT_CLINIC_OR_DEPARTMENT_OTHER)
Admission: RE | Admit: 2020-10-03 | Discharge: 2020-10-03 | Disposition: A | Discharge status: Home / Self Care | Payer: HMO | Source: Ambulatory Visit | Attending: Internal Medicine | Admitting: Internal Medicine

## 2020-10-03 ENCOUNTER — Other Ambulatory Visit: Payer: Self-pay

## 2020-10-03 ENCOUNTER — Ambulatory Visit (HOSPITAL_BASED_OUTPATIENT_CLINIC_OR_DEPARTMENT_OTHER)
Admission: RE | Admit: 2020-10-03 | Discharge: 2020-10-03 | Disposition: A | Discharge status: Home / Self Care | Payer: HMO | Source: Ambulatory Visit

## 2020-10-03 VITALS — BP 136/82 | HR 86 | Wt 228.6 lb

## 2020-10-03 DIAGNOSIS — Z006 Encounter for examination for normal comparison and control in clinical research program: Secondary | ICD-10-CM

## 2020-10-03 DIAGNOSIS — Z7951 Long term (current) use of inhaled steroids: Secondary | ICD-10-CM | POA: Diagnosis not present

## 2020-10-03 DIAGNOSIS — Z95810 Presence of automatic (implantable) cardiac defibrillator: Secondary | ICD-10-CM | POA: Diagnosis not present

## 2020-10-03 DIAGNOSIS — G4733 Obstructive sleep apnea (adult) (pediatric): Secondary | ICD-10-CM | POA: Insufficient documentation

## 2020-10-03 DIAGNOSIS — Z7984 Long term (current) use of oral hypoglycemic drugs: Secondary | ICD-10-CM | POA: Diagnosis not present

## 2020-10-03 DIAGNOSIS — I493 Ventricular premature depolarization: Secondary | ICD-10-CM | POA: Insufficient documentation

## 2020-10-03 DIAGNOSIS — Z79899 Other long term (current) drug therapy: Secondary | ICD-10-CM | POA: Diagnosis not present

## 2020-10-03 DIAGNOSIS — Z9884 Bariatric surgery status: Secondary | ICD-10-CM | POA: Insufficient documentation

## 2020-10-03 DIAGNOSIS — I5022 Chronic systolic (congestive) heart failure: Secondary | ICD-10-CM

## 2020-10-03 DIAGNOSIS — Z6838 Body mass index (BMI) 38.0-38.9, adult: Secondary | ICD-10-CM | POA: Diagnosis not present

## 2020-10-03 DIAGNOSIS — Z8249 Family history of ischemic heart disease and other diseases of the circulatory system: Secondary | ICD-10-CM | POA: Diagnosis not present

## 2020-10-03 DIAGNOSIS — I5042 Chronic combined systolic (congestive) and diastolic (congestive) heart failure: Secondary | ICD-10-CM | POA: Diagnosis not present

## 2020-10-03 DIAGNOSIS — R5383 Other fatigue: Secondary | ICD-10-CM | POA: Diagnosis not present

## 2020-10-03 DIAGNOSIS — E119 Type 2 diabetes mellitus without complications: Secondary | ICD-10-CM | POA: Insufficient documentation

## 2020-10-03 DIAGNOSIS — I110 Hypertensive heart disease with heart failure: Secondary | ICD-10-CM | POA: Insufficient documentation

## 2020-10-03 LAB — ECHOCARDIOGRAM COMPLETE
Area-P 1/2: 2.91 cm2
Calc EF: 38.6 %
S' Lateral: 4.8 cm
Single Plane A2C EF: 38.7 %
Single Plane A4C EF: 32 %
Weight: 3657.6 oz

## 2020-10-03 LAB — BASIC METABOLIC PANEL
Anion gap: 4 — ABNORMAL LOW (ref 5–15)
BUN: <5 mg/dL — ABNORMAL LOW (ref 6–20)
CO2: 28 mmol/L (ref 22–32)
Calcium: 8.7 mg/dL — ABNORMAL LOW (ref 8.9–10.3)
Chloride: 108 mmol/L (ref 98–111)
Creatinine, Ser: 0.72 mg/dL (ref 0.44–1.00)
GFR, Estimated: >60 mL/min (ref >60)
Glucose, Bld: 94 mg/dL (ref 70–99)
Potassium: 4 mmol/L (ref 3.5–5.1)
Sodium: 140 mmol/L (ref 135–145)

## 2020-10-03 NOTE — Patient Instructions (Signed)
It was great to see you today! No medication changes are needed at this time.  Labs today We will only contact you if something comes back abnormal or we need to make some changes. Otherwise no news is good news!  Your physician recommends that you schedule a follow-up appointment in: 4 weeks with the pharmacy team and 3 months with Dr Haroldine Laws  Do the following things EVERYDAY: 1) Weigh yourself in the morning before breakfast. Write it down and keep it in a log. 2) Take your medicines as prescribed 3) Eat low salt foods--Limit salt (sodium) to 2000 mg per day.  4) Stay as active as you can everyday 5) Limit all fluids for the day to less than 2 liters  At the [facility], you and your health needs are our priority. As part of our continuing mission to provide you with exceptional heart care, we have created designated Provider Care Teams. These Care Teams include your primary Cardiologist (physician) and Advanced Practice Providers (APPs- Physician Assistants and Nurse Practitioners) who all work together to provide you with the care you need, when you need it.   You may see any of the following providers on your designated Care Team at your next follow up: Marland Kitchen Dr Glori Bickers . Dr Loralie Champagne . Dr Vickki Muff . Darrick Grinder, NP . Lyda Jester, [place] . Audry Riles, PharmD   Please be sure to bring in all your medications bottles to every appointment.   If you have any questions or concerns before your next appointment please send [place] a message through [place] or call our office at [phone].    TO LEAVE A MESSAGE FOR THE NURSE SELECT OPTION 2, PLEASE LEAVE A MESSAGE INCLUDING: . YOUR NAME . DATE OF BIRTH . CALL BACK NUMBER . REASON FOR CALL**this is important as we prioritize the call backs  YOU WILL RECEIVE A CALL BACK THE SAME DAY AS LONG AS YOU CALL BEFORE 4:00 PM

## 2020-10-03 NOTE — Progress Notes (Signed)
error 

## 2020-10-03 NOTE — Progress Notes (Signed)
  Echocardiogram 2D Echocardiogram has been performed.  [name] 10/03/2020, 4:08 PM

## 2020-10-03 NOTE — Progress Notes (Addendum)
Advanced Heart Failure Clinic Note   Referring Physician: PCP: Hoyt Koch, MD PCP-Cardiologist: Sinclair Grooms, MD  [facility]: Dr. Haroldine Laws   Reason for Visit: F/u for Chronic Systolic Heart Failure  HPI:  [name] is a 45 y.o. 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   Was admitted 12/5-11/2016 with recurrent HF, URI, CP and elevated BP (160/110). CT chest no PE. Diuresed from 233-> 228 pounds.   Admitted 11/2017 with CP described as "pounding". Troponins were negative. Symptoms thought to be related to frequent PVCs. EP consulted and scheduled her for PVC ablation. Echo repeated 11/22/17 and showed improved EF 50-55%. HF team consulted and changed torsemide to 40 mg daily (from 3x/week).   Underwent PVC ablation 12/01/17. Was felt not to be successful.  Was on flecainide but discontinued due to reduced EF. Now on amiodarone.   Repeat Echo 10/2018 showed drop in EF back down to 25-30%. Was instructed to f/u in clinic but pt did not. She had outpatient monitor 04/2018 that showed rare PVCs.    Evaluated in the ED 04/11/20 w/ CC of chest pain. According to EDP documentation, pain had been present for 3-6 hrs prior to presenting to the ED. Described as central-left sided heaviness/pressure/tightness that was worse with exertion. No pleuritic pain or hypoxia to suggest acute PE. Hs Troponin normal x 2 (4>>5). EKG showed sinus tach 104 w/ frequent PVCs. Device interrogation was unremarkable. She was released from the ED and instructed to f/u in clinic.    Saw EP on 12/11 with suggestion for bb again.   Admitted 10/2019 with chest pain and shortness of breath. Viral panel negative. ECHO completed and showed  EF < 20% and normal RV. Had cath with preserved cardiac output. Plan was to f/u with EP regarding PVCs. She has remained on AAD therapy w/ amiodarone +  blocker therapy.  Recently admitted to [facility] 4/22 w/ increased dyspnea and volume overload. Also w/ increased PVC burden. Echo showed EF 20-25%, RV normal. She was diuresed w/ IV Lasix and placed on amiodarone gtt for PVC suppression. Unfortunately was over diuresed, and was given back IVFs. BP however remained soft, and Entresto was discontinued. Torsemide was also discontinued. She was placed on Jardiance 10 mg at discharge.  blocker was changed from Coreg to Toprol XL 50 mg daily given low BP. EP was also consulted and recommended increasing PO amio to 200 mg bid at d/c w/ close EP clinic f/u.   She had post hospital f/u on 4/12 and was improving but still fluid overloaded,  Device interrogation showed HL score of 18 (goal 6). We restarted torsemide and spiro. She had f/u remote device check on 5/2 and HL index was 0, suggesting normal fluid levels.    Since her last visit, she has also been seen again by EP. Per Dr. Lovena Le, due to the location of her PVCs, she is not a candidate for re-do ablation. Could consider referral to tertiary center, but success rate is not any more likely. They did change her  blocker from Coreg, to metoprolol to see if more cardioselective  blocker would be of more benefit. Also given her persistent HF symptoms, despite med optimization, she is felt to be a candidate  for BaroStim. Evaluation underway.   She presents to clinic today for f/u. Wt is down 2 lb. HL score 0.  BP ok 136/82. Still complains of daytime fatigue. Remains NYHA Class II. No palpitations.    [employer] Score: 0   Cardiac Studies  Echo done 05/19/2018 by Dr Haroldine Laws --. EF back down to 20-25% - Bedside echo by Dr. Haroldine Laws with EF improved to 50%. Echo 6/20 - EF back down at 25-30%.   Cath 6/17 revealed normal vessels, moderate  elevation in pulmonary artery pressures, and LVEF 25-30%.  RHC 10/2019  RA = 4 RV = 32/6 PA = 31/8 (21) PCW = 12 Fick cardiac output/index = 7.0/3.4 Thermo CO/CI = 5.2/2.5 PVR =1.7 (Thermo) Ao sat = 98% PA sat = 67%, 69%  RHC 3/19: RA = 2 RV = 26/7 PA = 28/13 (19) PCW = 5 Fick cardiac output/index = 5.7/2.8 PVR = 2.5 WU Ao sat = 99% PA sat = 71%, 72% High SVC sat = 65%  RHC 6/17 RA 5 RV 35/6 PA 41/12 PW 11 Fick 6.4/3.0 PA sat 62%  Echo 6/15 35-40% Echo 6/17 25-30% Echo 12/17 20-25% Echo 8/18 EF 25% Echo 7/19: EF 50-55% Echo 6/20: EF 25-30% Echo 11/13/19: Ef < 20% RV normal  Echo 4/22: EF 20-25%, RV normal   CPX 2/18 FVC 2.57 (79%)    FEV1 2.14 (81%)     FEV1/FVC 83 (101%)     MVV 107 (102%) Resting HR: 106 Peak HR: 166  (93% age predicted max HR) BP rest: 136/98 BP peak: 174/88 Peak VO2: 17.1 (85% predicted peak VO2) - corrects to 28.4 for ibw VE/VCO2 slope: 33 OUES: 2.16 Peak RER: 1.09 Ventilatory Threshold: 14.6 (73% predicted or measured peak VO2) VE/MVV: 59% PETCO2 at peak: 33 O2pulse: 10  (83% predicted O2pulse)  CPX 12/18 pVO2 14.0 (corrects to 23.0 for IBW) VeVCO2  32 RER 1.0  PFTs w/ [facility] 4/22 Mild Restriction Normal Diffusion  Review of systems complete and found to be negative unless listed in HPI.       Past Medical History:  Diagnosis Date  . Acute on chronic systolic (congestive) heart failure ([place]) 04/29/2017  . Acute pain of right shoulder 06/16/2017  . AICD (automatic cardioverter/defibrillator) present   . Anxiety   . Arthritis    right shoulder   . Asthma   . CHF (congestive heart failure) ([place])   . Chronic combined systolic and diastolic heart failure ([place]) 03/05/2014  . Closed low lateral malleolus fracture 10/23/2013  . Cystitis 10/21/2017  . Depression   . Depression with anxiety 01/20/2013  . Diabetes mellitus without complication ([place])   . Diverticulosis   . Dyspnea    comes and goes  intermittently mostly with exertion   . Essential hypertension    Prev followed by [facility]/ Cardiology   . Fibroid    age 41  . Gallstones   . Generalized abdominal cramping   . History of cardiomyopathy   . Hypertension   . IBS (irritable bowel syndrome)   . 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 Insomnia 05/07/2017  . Labral tear of shoulder 04/04/2015    Injected 04/04/2015 Injected 12/03/2015   . Migraine    "monthly" (08/03/2016)  . Myofascial pain 06/16/2017  . NICM (nonischemic cardiomyopathy) ([place]) 08/03/2016  . Nonallopathic lesion of lumbosacral region 11/16/2016  . Nonallopathic lesion of sacral region 11/16/2016  . Nonallopathic lesion of thoracic region 08/20/2014  . Nonspecific chest pain 04/28/2017  .  OSA (obstructive sleep apnea) 01/02/2013   NPSG 2009:  AHI 9/hr. CPAP intolerance >> "smothering" Good tolerance of auto device (optimal pressure 12-13 on download).  - referred to Dr Gwenette Greet    . OSA on CPAP   . Ovarian cyst    1999; surgically removed  . Patellofemoral syndrome of both knees 10/16/2016  . 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 PVC (premature ventricular contraction) 06/23/2016  . Seizures ([place])    "as a child" (08/03/2016)  . Termination of pregnancy    due to cardiac risk    Current Outpatient Medications  Medication Sig Dispense Refill  . albuterol (PROAIR HFA) 108 (90 Base) MCG/ACT inhaler Inhale 1-2 puffs into the lungs every 6 (six) hours as needed for wheezing or shortness of breath. 18 g 5  . amiodarone (PACERONE) 200 MG tablet Take 1 tablet (200 mg total) by mouth 2 (two) times daily. 60 tablet 2  . budesonide-formoterol (SYMBICORT) 160-4.5 MCG/ACT inhaler Inhale 2 puffs into the lungs 2 (two) times daily as needed. 30.6 each 3  . cyanocobalamin (,VITAMIN B-12,) 1000 MCG/ML injection Inject 1 mL (1,000 mcg total) into the muscle as directed. Once daily for 7 days, then once weekly for 4  weeks followed by once monthly 11 mL 1  . diclofenac (VOLTAREN) 75 MG EC tablet Take 1 tablet (75 mg total) by mouth 2 (two) times daily as needed. 30 tablet 0  . diclofenac Sodium (VOLTAREN) 1 % GEL Apply 4 g topically 4 (four) times daily. 300 g 6  . dicyclomine (BENTYL) 20 MG tablet Take 1 tablet (20 mg total) by mouth daily as needed for spasms. 90 tablet 3  . empagliflozin (JARDIANCE) 10 MG TABS tablet Take 1 tablet (10 mg total) by mouth daily. 30 tablet 3  . fluticasone (FLONASE) 50 MCG/ACT nasal spray Place 2 sprays into both nostrils daily as needed for allergies. 48 g 0  . gabapentin (NEURONTIN) 300 MG capsule Take 1 capsule (300 mg total) by mouth at bedtime as needed (back spasms). 90 capsule 3  . ipratropium-albuterol (DUONEB) 0.5-2.5 (3) MG/3ML SOLN Take 3 mLs by nebulization every 6 (six) hours as needed. 360 mL 0  . ivabradine (CORLANOR) 7.5 MG TABS tablet Take 1 tablet (7.5 mg total) by mouth 2 (two) times daily with a meal. 60 tablet 2  . lidocaine (LIDODERM) 5 % Place 1 patch onto the skin daily. Remove & Discard patch within 12 hours or as directed by MD 90 patch 3  . Magnesium Oxide 200 MG TABS Take 1 tablet (200 mg total) by mouth daily. 90 tablet 3  . metoprolol succinate (TOPROL-XL) 50 MG 24 hr tablet Take 1 tablet (50 mg total) by mouth daily. Take with or immediately following a meal. 30 tablet 3  . montelukast (SINGULAIR) 10 MG tablet Take 1 tablet (10 mg total) by mouth at bedtime. 30 tablet 3  . Multiple Vitamins-Minerals (MULTIVITAMIN GUMMIES WOMENS PO) Take 2 tablets by mouth at bedtime.     . nitroGLYCERIN (NITROSTAT) 0.4 MG SL tablet Place 1 tablet (0.4 mg total) under the tongue every 5 (five) minutes as needed for chest pain. 75 tablet 0  . omeprazole (PRILOSEC) 20 MG capsule Take 1 capsule (20 mg total) by mouth daily as needed (for reflux symptoms). 30 capsule 0  . spironolactone (ALDACTONE) 25 MG tablet Take 0.5 tablets (12.5 mg total) by mouth at bedtime. 90  tablet 3  . tiZANidine (ZANAFLEX) 4 MG tablet Take  4 mg by mouth every 8 (eight) hours as needed for muscle spasms.     . Vitamin D, Ergocalciferol, (DRISDOL) 1.25 MG (50000 UNIT) CAPS capsule Take 1 capsule (50,000 Units total) by mouth every 7 (seven) days. 12 capsule 0  . sacubitril-valsartan (ENTRESTO) 97-103 MG Take 1 tablet by mouth 2 (two) times daily. HOLD until follow-up with cardiology (Patient not taking: Reported on 10/03/2020) 60 tablet 2  . torsemide (DEMADEX) 20 MG tablet Take 1 tablet (20 mg total) by mouth daily. (Patient not taking: Reported on 10/03/2020) 30 tablet 3   No current facility-administered medications for this encounter.    Allergies  Allergen Reactions  . Vancomycin Other (See Comments)    "did something to my kidneys," PROGRESSED TO KIDNEY FAILURE!!  Marland Kitchen Aspirin Other (See Comments)    Wheezing, (Pt states that she just wheezes some when she takes aspirin by itself but she can take aspirin in a combination product).  . Contrast Media [Iodinated Diagnostic Agents] Other (See Comments)    Multiple CT contrast studies done over 2 weeks caused ARF  . Avocado Itching and Swelling  . Cyclobenzaprine Other (See Comments)  . Oxycodone Other (See Comments)  . Ciprofloxacin Itching and Rash  . Farxiga [Dapagliflozin] Rash  . Sulfa Antibiotics Itching and Rash      Social History   Socioeconomic History  . Marital status: Married    Spouse name: Not on file  . Number of children: 1  . Years of education: Not on file  . Highest education level: Not on file  Occupational History  . Occupation: stay at home mom  Tobacco Use  . Smoking status: Never Smoker  . Smokeless tobacco: Never Used  Vaping Use  . Vaping Use: Never used  Substance and Sexual Activity  . Alcohol use: No  . Drug use: No  . Sexual activity: Yes  Other Topics Concern  . Not on file  Social History Narrative  . Not on file   Social Determinants of Health   Financial Resource Strain:  Not on file  Food Insecurity: Not on file  Transportation Needs: Not on file  Physical Activity: Insufficiently Active  . Days of Exercise per Week: 2 days  . Minutes of Exercise per Session: 40 min  Stress: Not on file  Social Connections: Socially Isolated  . Frequency of Communication with Friends and Family: Twice a week  . Frequency of Social Gatherings with Friends and Family: Never  . Attends Religious Services: Never  . Active Member of Clubs or Organizations: No  . Attends Archivist Meetings: Never  . Marital Status: Married  Human resources officer Violence: Not on file      Family History  Problem Relation Age of Onset  . Emphysema Maternal Grandmother        smoked  . Heart disease Maternal Grandmother 4       MI  . Rheum arthritis Mother   . Allergies Daughter   . Colon cancer Neg Hx     Vitals:   10/03/20 1406  BP: 136/82  Pulse: 86  SpO2: 98%  Weight: 103.7 kg (228 lb 9.6 oz)    PHYSICAL EXAM: General:  Well appearing, moderately obese. No respiratory difficulty HEENT: normal Neck: supple. no JVD. Carotids 2+ bilat; no bruits. No lymphadenopathy or thyromegaly appreciated. Cor: PMI nondisplaced. Regular rate & rhythm. No rubs, gallops or murmurs. Lungs: clear Abdomen: soft, nontender, nondistended. No hepatosplenomegaly. No bruits or masses. Good bowel sounds. Extremities: no  cyanosis, clubbing, rash, edema Neuro: alert & oriented x 3, cranial nerves grossly intact. moves all 4 extremities w/o difficulty. Affect pleasant.    ECG: not performed    ASSESSMENT & PLAN:  1. Chronic systolic HF:  due to peri-partum CM, onset 1999. S/P [employer] ICD 07/2016 - EF 20-25% (12/17).  - Echo 8/18 EF 25-30% s/p ICD 3/18. [place] 3/19: RA 2, PA 28/13, Fick CO 5.7, CI 2.8 - CPX 12/18 showed moderate limitation due mostly to obesity with some HF component - Echo 11/22/17 LVEF 50-55%, Grade 1 DD, Mild MR, Mod LAE - Echo 04/2018 by Dr Haroldine Laws EF 20-25%.  RV ok  - Echo 10/2019 EF < 20%. - Echo 08/2020 EF 20-25%, RV normal  - NYHA Class II-early III. Volume status good. HL Score 0 - Continue Jardiance 10 mg (tolerating ok, no GU SEs) - Continue Spiro 12.5 mg qhs - Continue Torsemide 20 mg daily   - Continue Toprol XL 50 mg daily    - Has been off Entresto w/ low BP. Now improved.  - Check BMP today, if SCr/K ok will plan to restart low dose Entersto 24-26 mg bid and reduce torsemide to 20 mg qod. - Continue corlanor 7.5mg  BID for now.  - Not candidate for CRT-D upgrade due to narrow QRS. EP evaluating for BaroStim Neo. Check NT pro BNP today - if no improvement post BaroStim placement, will consider repeat CPX testing>>RHC  2. Frequent PVCs/ Bigeminy - Holter Monitor 11/09/17 with 30% PVCs with one primary morphology.  - S/p PVC ablation 11/2017 with Dr Lovena Le unsuccessful - Off flecainide with EF down and persistent PVCs. Changed to amio - Zio Patch 04/2019 > 17% PVCs and >8% couplets.  - Zio Patch returned on 11/28/2019  - Amio recently increased back to 200 mg bid given increased burden  - Per Dr. Lovena Le, due to the location of her PVCs, she is not a candidate for re-do ablation. Could consider referral to tertiary center, but success rate is not any more likely.  - c/w metoprolol   3. Chronic Amiodarone Use  - I worry about her young age and long term use of amiodarone. This appears to be her only good option for PVC suppression.  -  Will need yearly PFTs w/ [facility] for surveillance. Recent study 4/22 showed mild restriction w/ normal diffusion. Repeat again 4/23.   - Recent TSH and HFTs were ok.  - She gets annual eye exams.   4. Morbid obesity - s/p Gastric Sleeve at [facility] 09/20/17  - Body mass index is 38.04 kg/m.   5. Daytime Fatigue - Sleep study in 2019 and again in 2021 were both negative for sleep apnea  - reports compliance w/ Vit D supp + Fe supp  - suspect due to chronic HF/ PVCs   6. DM2 - well controlled,  recent Hgb A1c 5.5 - now on Jardiance for concomitant HF   Follow-up w/ Pharm D in 4 weeks and Dr. Haroldine Laws in 3 months   Lyda Jester, PA-C 10/03/20

## 2020-10-03 NOTE — Addendum Note (Signed)
Addended by: Preston Fleeting C on: 10/03/2020 03:38 PM   Modules accepted: Orders

## 2020-10-04 ENCOUNTER — Encounter: Payer: Self-pay | Admitting: Surgery

## 2020-10-04 ENCOUNTER — Ambulatory Visit (INDEPENDENT_AMBULATORY_CARE_PROVIDER_SITE_OTHER): Payer: HMO | Admitting: Surgery

## 2020-10-04 DIAGNOSIS — I5022 Chronic systolic (congestive) heart failure: Secondary | ICD-10-CM

## 2020-10-04 LAB — PRO B NATRIURETIC PEPTIDE: NT-Pro BNP: 913 pg/mL — ABNORMAL HIGH (ref 0–249)

## 2020-10-04 NOTE — Progress Notes (Signed)
Batwire #1 SF  Pt will go for commercial implant

## 2020-10-04 NOTE — Progress Notes (Signed)
[facility]ist [facility] [place]  Patient name: [name] MRN: [id number] DOB: 1975-08-04 Sex: female   REQUESTING PROVIDER:    Dr. Rayann Heman   REASON FOR CONSULT:    Barostim evaluation  HISTORY OF PRESENT ILLNESS:   [name] is a 45 y.o. female, who is being evaluated for a Barostim device.  She has chronic systolic heart failure she has NYHA III symptoms.  Her most recent echo on 08/26/2020 showed an ejection fraction of 20-25%.  She has a single-chamber ICD.  She is on amiodarone for frequent PVCs.  She continues to have dyspnea and mild to moderate exertional fatigue with weakness.  She was recently hospitalized for heart failure and April 2022  The patient is a diabetic.  She is medically managed for hypertension.  She is a non-smoker.  PAST MEDICAL HISTORY    Past Medical History:  Diagnosis Date  . Acute on chronic systolic (congestive) heart failure ([place]) 04/29/2017  . Acute pain of right shoulder 06/16/2017  . AICD (automatic cardioverter/defibrillator) present   . Anxiety   . Arthritis    right shoulder   . Asthma   . CHF (congestive heart failure) ([place])   . Chronic combined systolic and diastolic heart failure ([place]) 03/05/2014  . Closed low lateral malleolus fracture 10/23/2013  . Cystitis 10/21/2017  . Depression   . Depression with anxiety 01/20/2013  . Diabetes mellitus without complication ([place])   . Diverticulosis   . Dyspnea    comes and goes intermittently mostly with exertion   . Essential hypertension    Prev followed by [facility]/ Cardiology   . Fibroid    age 45  . Gallstones   . Generalized abdominal cramping   . History of cardiomyopathy   . Hypertension   . IBS (irritable bowel syndrome)   . 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 Insomnia 05/07/2017  . Labral tear of shoulder 04/04/2015    Injected 04/04/2015 Injected 12/03/2015   . Migraine    "monthly"  (08/03/2016)  . Myofascial pain 06/16/2017  . NICM (nonischemic cardiomyopathy) ([place]) 08/03/2016  . Nonallopathic lesion of lumbosacral region 11/16/2016  . Nonallopathic lesion of sacral region 11/16/2016  . Nonallopathic lesion of thoracic region 08/20/2014  . Nonspecific chest pain 04/28/2017  . OSA (obstructive sleep apnea) 01/02/2013   NPSG 2009:  AHI 9/hr. CPAP intolerance >> "smothering" Good tolerance of auto device (optimal pressure 12-13 on download).  - referred to Dr Gwenette Greet    . OSA on CPAP   . Ovarian cyst    1999; surgically removed  . Patellofemoral syndrome of both knees 10/16/2016  . 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 PVC (premature ventricular contraction) 06/23/2016  . Seizures ([place])    "as a child" (08/03/2016)  . Termination of pregnancy    due to cardiac risk     FAMILY HISTORY   Family History  Problem Relation Age of Onset  . Emphysema Maternal Grandmother        smoked  . Heart disease Maternal Grandmother 63       MI  . Rheum arthritis Mother   . Allergies Daughter   . Colon cancer Neg Hx     SOCIAL HISTORY:   Social History   Socioeconomic History  . Marital status: Married    Spouse name: Not on file  . Number of children: 1  . Years of education: Not on file  . Highest education  level: Not on file  Occupational History  . Occupation: stay at home mom  Tobacco Use  . Smoking status: Never Smoker  . Smokeless tobacco: Never Used  Vaping Use  . Vaping Use: Never used  Substance and Sexual Activity  . Alcohol use: No  . Drug use: No  . Sexual activity: Yes  Other Topics Concern  . Not on file  Social History Narrative  . Not on file   Social Determinants of Health   Financial Resource Strain: Not on file  Food Insecurity: Not on file  Transportation Needs: Not on file  Physical Activity: Insufficiently Active  . Days of Exercise per Week: 2 days  . Minutes of Exercise per Session: 40 min  Stress: Not on  file  Social Connections: Socially Isolated  . Frequency of Communication with Friends and Family: Twice a week  . Frequency of Social Gatherings with Friends and Family: Never  . Attends Religious Services: Never  . Active Member of Clubs or Organizations: No  . Attends Archivist Meetings: Never  . Marital Status: Married  Human resources officer Violence: Not on file    ALLERGIES:    Allergies  Allergen Reactions  . Vancomycin Other (See Comments)    "did something to my kidneys," PROGRESSED TO KIDNEY FAILURE!!  Marland Kitchen Aspirin Other (See Comments)    Wheezing, (Pt states that she just wheezes some when she takes aspirin by itself but she can take aspirin in a combination product).  . Contrast Media [Iodinated Diagnostic Agents] Other (See Comments)    Multiple CT contrast studies done over 2 weeks caused ARF  . Avocado Itching and Swelling  . Cyclobenzaprine Other (See Comments)  . Oxycodone Other (See Comments)  . Ciprofloxacin Itching and Rash  . Farxiga [Dapagliflozin] Rash  . Sulfa Antibiotics Itching and Rash    CURRENT MEDICATIONS:    Current Outpatient Medications  Medication Sig Dispense Refill  . albuterol (PROAIR HFA) 108 (90 Base) MCG/ACT inhaler Inhale 1-2 puffs into the lungs every 6 (six) hours as needed for wheezing or shortness of breath. 18 g 5  . amiodarone (PACERONE) 200 MG tablet Take 1 tablet (200 mg total) by mouth 2 (two) times daily. 60 tablet 2  . budesonide-formoterol (SYMBICORT) 160-4.5 MCG/ACT inhaler Inhale 2 puffs into the lungs 2 (two) times daily as needed. 30.6 each 3  . cyanocobalamin (,VITAMIN B-12,) 1000 MCG/ML injection Inject 1 mL (1,000 mcg total) into the muscle as directed. Once daily for 7 days, then once weekly for 4 weeks followed by once monthly 11 mL 1  . diclofenac (VOLTAREN) 75 MG EC tablet Take 1 tablet (75 mg total) by mouth 2 (two) times daily as needed. 30 tablet 0  . diclofenac Sodium (VOLTAREN) 1 % GEL Apply 4 g topically  4 (four) times daily. 300 g 6  . dicyclomine (BENTYL) 20 MG tablet Take 1 tablet (20 mg total) by mouth daily as needed for spasms. 90 tablet 3  . empagliflozin (JARDIANCE) 10 MG TABS tablet Take 1 tablet (10 mg total) by mouth daily. 30 tablet 3  . fluticasone (FLONASE) 50 MCG/ACT nasal spray Place 2 sprays into both nostrils daily as needed for allergies. 48 g 0  . gabapentin (NEURONTIN) 300 MG capsule Take 1 capsule (300 mg total) by mouth at bedtime as needed (back spasms). 90 capsule 3  . ipratropium-albuterol (DUONEB) 0.5-2.5 (3) MG/3ML SOLN Take 3 mLs by nebulization every 6 (six) hours as needed. 360 mL 0  .  ivabradine (CORLANOR) 7.5 MG TABS tablet Take 1 tablet (7.5 mg total) by mouth 2 (two) times daily with a meal. 60 tablet 2  . lidocaine (LIDODERM) 5 % Place 1 patch onto the skin daily. Remove & Discard patch within 12 hours or as directed by MD 90 patch 3  . Magnesium Oxide 200 MG TABS Take 1 tablet (200 mg total) by mouth daily. 90 tablet 3  . metoprolol succinate (TOPROL-XL) 50 MG 24 hr tablet Take 1 tablet (50 mg total) by mouth daily. Take with or immediately following a meal. 30 tablet 3  . montelukast (SINGULAIR) 10 MG tablet Take 1 tablet (10 mg total) by mouth at bedtime. 30 tablet 3  . Multiple Vitamins-Minerals (MULTIVITAMIN GUMMIES WOMENS PO) Take 2 tablets by mouth at bedtime.     . nitroGLYCERIN (NITROSTAT) 0.4 MG SL tablet Place 1 tablet (0.4 mg total) under the tongue every 5 (five) minutes as needed for chest pain. 75 tablet 0  . omeprazole (PRILOSEC) 20 MG capsule Take 1 capsule (20 mg total) by mouth daily as needed (for reflux symptoms). 30 capsule 0  . sacubitril-valsartan (ENTRESTO) 97-103 MG Take 1 tablet by mouth 2 (two) times daily. HOLD until follow-up with cardiology (Patient not taking: Reported on 10/03/2020) 60 tablet 2  . spironolactone (ALDACTONE) 25 MG tablet Take 0.5 tablets (12.5 mg total) by mouth at bedtime. 90 tablet 3  . tiZANidine (ZANAFLEX) 4 MG  tablet Take 4 mg by mouth every 8 (eight) hours as needed for muscle spasms.     Marland Kitchen torsemide (DEMADEX) 20 MG tablet Take 1 tablet (20 mg total) by mouth daily. (Patient not taking: Reported on 10/03/2020) 30 tablet 3  . Vitamin D, Ergocalciferol, (DRISDOL) 1.25 MG (50000 UNIT) CAPS capsule Take 1 capsule (50,000 Units total) by mouth every 7 (seven) days. 12 capsule 0   No current facility-administered medications for this visit.    REVIEW OF SYSTEMS:   [X]  denotes positive finding, [ ]  denotes negative finding Cardiac  Comments:  Chest pain or chest pressure:    Shortness of breath upon exertion: x   Short of breath when lying flat:    Irregular heart rhythm: x       Vascular    Pain in calf, thigh, or hip brought on by ambulation:    Pain in feet at night that wakes you up from your sleep:     Blood clot in your veins:    Leg swelling:         Pulmonary    Oxygen at home:    Productive cough:     Wheezing:         Neurologic    Sudden weakness in arms or legs:     Sudden numbness in arms or legs:     Sudden onset of difficulty speaking or slurred speech:    Temporary loss of vision in one eye:     Problems with dizziness:         Gastrointestinal    Blood in stool:      Vomited blood:         Genitourinary    Burning when urinating:     Blood in urine:        Psychiatric    Major depression:         Hematologic    Bleeding problems:    Problems with blood clotting too easily:        Skin    Rashes or  ulcers:        Constitutional    Fever or chills:     PHYSICAL EXAM:   There were no vitals filed for this visit.  GENERAL: The patient is a well-nourished female, in no acute distress. The vital signs are documented above. CARDIAC: There is a regular rate and rhythm.  VASCULAR: I evaluated her carotid artery with ultrasound.  Her bifurcation is too high for BATWIRE PULMONARY: Nonlabored respirations ABDOMEN: Soft and non-tender with normal pitched bowel  sounds.  MUSCULOSKELETAL: There are no major deformities or cyanosis. NEUROLOGIC: No focal weakness or paresthesias are detected. SKIN: There are no ulcers or rashes noted. PSYCHIATRIC: The patient has a normal affect.  STUDIES:   I have reviewed the following studies  Echo: 1. Left ventricular ejection fraction, by estimation, is 30 to 35%. The  left ventricle has moderately decreased function. The left ventricle  demonstrates global hypokinesis. The left ventricular internal cavity size  was mildly to moderately dilated.  Left ventricular diastolic parameters are consistent with Grade III  diastolic dysfunction (restrictive). Elevated left ventricular  end-diastolic pressure.  2. Right ventricular systolic function is normal. The right ventricular  size is normal. There is mildly elevated pulmonary artery systolic  pressure. The estimated right ventricular systolic pressure is [id number] mmHg.  3. Left atrial size was mildly dilated.  4. The mitral valve is normal in structure. Mild mitral valve  regurgitation. No evidence of mitral stenosis.  5. The aortic valve is normal in structure. Aortic valve regurgitation is  not visualized. No aortic stenosis is present.  6. Aortic dilatation noted. There is borderline dilatation of the aortic  root, measuring 37 mm.  7. The inferior vena cava is normal in size with greater than 50%  respiratory variability, suggesting right atrial pressure of 3 mmHg.  Carotid duplex:  Right Carotid: The extracranial vessels were near-normal with only minimal  wall         thickening or plaque.   Left Carotid: The extracranial vessels were near-normal with only minimal  wall        thickening or plaque.   Vertebrals: Bilateral vertebral arteries demonstrate antegrade flow.  Subclavians: Normal flow hemodynamics were seen in bilateral subclavian        arteries.   ASSESSMENT and PLAN   NYHA a class III heart failure: I  discussed that the patient is not a candidate for BATWIRE.  However, she would be an excellent candidate for the commercial Barostim NEO device.  I discussed the details of the procedure as well as the risks and benefits.  She would like to proceed.  We will begin insurance authorization and get her scheduled once she is cleared.  Leia Alf, MD, FACS [facility] [phone] Pager [phone]

## 2020-10-04 NOTE — Research (Signed)
Batwire screening #19   Section A:  Administrative Section  Subject ID: _[id number]_ - _019_ - 015 Subject Initials: _A_ _M_ _M_  Date subject signed informed consent  _12_/_MAY_/_2022_      (DD /  MMM   / YYYY)  Has the subject been previously screened?    [x] No     []  Yes                                                         Previous Subject ID _1 _2 _4 _5_ - __ __ __ - 015   Section B:  Enrollment Criteria  In the investigator's opinion does the subject meet the FDA Indication for Use:  [x] Yes    [] No (STOP - subject does not qualify for the study)  Has the subject been previously, or are currently, randomized in the CVRx BeAT-HF Trial? [] Yes (STOP - subject does not qualify for the study)   [x] No  Has the subject received cardiac resynchronization therapy (CRT) within six months of enrollment, or is actively receiving CRT? [] Yes (STOP - subject does not qualify for the study)   [x] No  Section C:  Serum Biomarkers   NT-proBNP: _913_ Date:    _12_/_MAY_/_2022_                  (DD / MMM / [place] Breeding)  eGFR:  _60_ mL/min/1.17m Date:    _12_/_MAY_/_2022__                  (DD / MMM / YDallas Breeding  Negative Pregnancy Test? [x] N/A Date:    _____/_______/_______                  (DD / MMM / YDallas Breeding   [] Yes     [] No   Section D:  Appropriate Surgical/Study Candidate:   Is the subject able to discontinue the use of antiplatelet drugs (e.g. aspirin) in advance of the procedure, if required? [] Yes   [] No  Assessed by:  ___Wells BTrula Slade MD ____ Implanting Physician [x] Yes Date:    _13_/_MAY_/_2022_                 (DD / MMM / YDallas Breeding   [] No   Section E:  Inclusion/Exclusion Criteria  Does the subject meet all Inclusion Criteria? [] Yes   [x] No (STOP - subject does not qualify for the study)   Check all that apply   [] 1. Age at least 24years and no more than 80 years at the time of enrollment.   [x] 2. Appropriate candidate for the surgery as determined by an evaluation from the  implanting physician using a carotid duplex ultrasound (CDU) [or Computed Tomography Angiography (CTA) if CDU inconclusive or incomplete] and a review of medical history (including existence of infections that may increase implant risk).  Evaluation must confirm the following within 45 days of the BAROSTIM NEO implant: . Appropriate medical condition and medical history for implantation of the BAROSTIM NEO System AND . Anatomy that enables this implant procedure, with no vascular structures or orientations or neck anomalies that would be obstructive to the implantation path AND . The artery planned for the BAROSTIM NEO implant must have: o A carotid bifurcation below  the level of the mandible AND o No ulcerative carotid arterial plaques AND o No carotid atherosclerosis producing a 30% or greater stenosis in linear diameter in the internal carotid AND o No carotid atherosclerosis producing a 30% or greater stenosis in linear diameter in the distal common carotid AND . Have had no prior surgery, radiation, or endovascular stent placement in the carotid artery or the carotid sinus region AND . Able to discontinue the use of antiplatelet drugs (e.g. aspirin) in advance of the procedure, if required   [] 3. Six-minute hall walk (6MHW) ? 150 m AND ? 400 m within 45 days prior to implant.   [] 4. Serum estimated glomerular filtration rate (eGFR) ? 25 mL/min/1.73 m2 using the CKD-EPI method within 45 days prior to the [facility] NEO implant.   [] 5. Body mass index ? 40 kg/m2 within 45 days prior to the [facility] NEO implant.   [] 6. If female and of childbearing potential, must use a medically accepted method of birth control (e.g., barrier method with spermicide, oral contraceptive, or abstinence) and agree to continue use of this method for the duration of the study. Women of childbearing potential must have a negative pregnancy test within 14 days prior to the [facility] NEO implant.   [] 7. Subjects implanted  with a cardiac rhythm management device that does not utilize an intracardiac lead, or implanted with a neurostimulation device, must be approved by the CVRx Clinical department.   [] 8. At the end of screening/baseline, subject still meets the Barostim Neo Indication for Use   [] 9. Signed a CVRx-approved informed consent form for participation in this study.      Does the subject meet any Exclusion Criteria? [] Yes (STOP - subject does not qualify for the study)   [x] No     List criteria # met: __________________________   [] 1. Previously or currently randomized in the CVRx BeAT-HF Trial.   [] 2. Received cardiac resynchronization therapy (CRT) within six months of enrollment or is actively receiving CRT.   [] 3. Any of the following contraindications: . Baroreflex failure or autonomic neuropathy  . Uncontrolled, symptomatic cardiac bradyarrhythmias . Known allergy to silicone or titanium   [] 4. Unstable ventricular arrhythmias.   [] 5. Presence of baseline cranial nerve dysfunction at risk from cervical interventions on the carotid bifurcation determined by the Ear, Nose and Throat (ENT) examination.   [] 6. Subjects with any surgery that has occurred, or is planned to occur, within 45 days of the BAROSTIM NEO implant.   [] 7. Recent history (within 6 months of implant) of significant and uncontrolled bleeding.   [] 8. Known and untreated hypercoagulability state.   [] 9. An inappropriate study candidate as evidenced by: Marland Kitchen Solid organ or hematologic transplant, or currently being evaluated for an organ transplant. . Has received or is receiving LVAD therapy or chronic dialysis. . Current or planned treatment with intravenous positive inotrope therapy. . Primary pulmonary hypertension. . Severe COPD or severe restrictive lung disease (e.g. requires chronic oral steroid use or home oxygen use). .  Heart failure secondary to a reversible cause, such as cardiac structural valvular disease,  acute myocarditis and pericardial constriction. . Clinically significant cardiac structural valvular disease. .  Unable or unwilling to fulfill the protocol medication compliance and follow-up requirements, for reasons including but not limited to an unresolved history of alcohol or substance abuse or psychiatric disorder. . Active malignancy. .  Any other serious  medical condition that may adversely affect the safety of the participant or validity of the study, in the opinion of the investigator. . Life expectancy less than one year.   [] 10. Any of the following within 3 months prior to the [facility] NEO implant. . Myocardial infarction . Unstable angina . Coronary intervention (e.g. CABG or PTCA)  . Cerebral vascular accident or transient ischemic attack . Sudden cardiac death  . Surgical cardiac intervention (e.g. cardiac ablation, valve replacement)   [] 11. Enrolled and active in another (e.g. device, pharmaceutical, or biological) clinical study unless approved by the [place].  Section F:  Adverse Events  Were there any Adverse Events that occurred since consent? [] Yes (complete AE form)   [x] No  Section H:  Medication Changes   Have there been any changes to the subject's home use medications for Arrhythmia, Antiplatelet/Anticoagulation and Heart failure medications during screening and baseline? [] Yes (update Med form)   [x] No  Section I:  Signature   Person completing form (Print) Name: [name] :) ___________  Signature: [name] :) ______ Date: __12/May/2022________    Six Minute Hall Walk Test Date:    _12_/_May_/_2022_  (DD / MMM / [place] Breeding)  Distance Walked _274__ meters  Were there any devices used to assist the subject in walking (e.g. cane, walker, etc.)? [x] No   [] Yes specify:_______________________  Was the walk terminated before 6 minutes? [x] No   [] Yes specify reason: (select all that apply)    [] Angina    []  Dyspnea     []  Fatigue    []  Dizziness    []  Syncope    []  Other, specify:  Name of person conducting 6-Minute Hall Walk: __Kay McChesney___   Page 1 of 1     Confidential   Form Protocol [phone] Rev. B dated 01-Mar-2019   Effective Date: 13-Apr-2019  Subject ID: _[id number]_ - _019_ - 015 Subject Initials: _A_ _M_ _M_ Visit Interval:  [x]  Baseline     [] 6 Month  Section B:  NYHA   NYHA Classification Date:    _12_/_May_/_2022_  (DD / MMM / [place] Bre[place])  Select One Class Subject Symptoms  [] I No limitations of physical activity, No undue fatigue, palpitation or dyspnea  [x] II Slight limitation of physical activity, Comfortable at rest, Less than ordinary activity results in fatigue, Palpitation, or dyspnea  [] III Marked limitation of physical activity, Comfortable at rest, Less than ordinary activity results in fatigue, palpitation, or dyspnea  [] IV Unable to carry out any physical activity without discomfort, Symptoms of cardiac insufficiency at rest, Physical activity causes increased discomfort  Name of person conducting NYHA: [name] :) ____________________

## 2020-10-04 NOTE — Progress Notes (Signed)
Batwire 19  Patient aware bifurcation is to high she will go for commercial

## 2020-10-04 NOTE — Progress Notes (Signed)
Pt aware and going for commercial implant SF for [employer]

## 2020-10-08 NOTE — Progress Notes (Signed)
Remote ICD transmission.   

## 2020-10-14 ENCOUNTER — Ambulatory Visit (INDEPENDENT_AMBULATORY_CARE_PROVIDER_SITE_OTHER): Payer: HMO

## 2020-10-14 DIAGNOSIS — I5042 Chronic combined systolic (congestive) and diastolic (congestive) heart failure: Secondary | ICD-10-CM | POA: Diagnosis not present

## 2020-10-14 DIAGNOSIS — Z95810 Presence of automatic (implantable) cardiac defibrillator: Secondary | ICD-10-CM

## 2020-10-15 ENCOUNTER — Other Ambulatory Visit: Payer: Self-pay

## 2020-10-16 ENCOUNTER — Inpatient Hospital Stay: Payer: HMO | Attending: Hematology and Oncology | Admitting: Hematology and Oncology

## 2020-10-16 ENCOUNTER — Inpatient Hospital Stay: Payer: HMO

## 2020-10-16 ENCOUNTER — Other Ambulatory Visit: Payer: Self-pay

## 2020-10-16 ENCOUNTER — Encounter: Payer: Self-pay | Admitting: Hematology and Oncology

## 2020-10-16 VITALS — BP 123/83 | HR 69 | Temp 98.0°F | Resp 18 | Ht 65.0 in | Wt 229.8 lb

## 2020-10-16 DIAGNOSIS — Z7951 Long term (current) use of inhaled steroids: Secondary | ICD-10-CM | POA: Insufficient documentation

## 2020-10-16 DIAGNOSIS — J45909 Unspecified asthma, uncomplicated: Secondary | ICD-10-CM | POA: Insufficient documentation

## 2020-10-16 DIAGNOSIS — G35 Multiple sclerosis: Secondary | ICD-10-CM | POA: Diagnosis not present

## 2020-10-16 DIAGNOSIS — E119 Type 2 diabetes mellitus without complications: Secondary | ICD-10-CM | POA: Diagnosis not present

## 2020-10-16 DIAGNOSIS — I5042 Chronic combined systolic (congestive) and diastolic (congestive) heart failure: Secondary | ICD-10-CM | POA: Insufficient documentation

## 2020-10-16 DIAGNOSIS — D500 Iron deficiency anemia secondary to blood loss (chronic): Secondary | ICD-10-CM | POA: Diagnosis not present

## 2020-10-16 DIAGNOSIS — N920 Excessive and frequent menstruation with regular cycle: Secondary | ICD-10-CM | POA: Diagnosis not present

## 2020-10-16 DIAGNOSIS — I110 Hypertensive heart disease with heart failure: Secondary | ICD-10-CM | POA: Insufficient documentation

## 2020-10-16 DIAGNOSIS — Z79899 Other long term (current) drug therapy: Secondary | ICD-10-CM | POA: Insufficient documentation

## 2020-10-16 LAB — COMPREHENSIVE METABOLIC PANEL
ALT: 7 U/L (ref 0–44)
AST: 14 U/L — ABNORMAL LOW (ref 15–41)
Albumin: 3.9 g/dL (ref 3.5–5.0)
Alkaline Phosphatase: 77 U/L (ref 38–126)
Anion gap: 11 (ref 5–15)
BUN: 10 mg/dL (ref 6–20)
CO2: 26 mmol/L (ref 22–32)
Calcium: 9.3 mg/dL (ref 8.9–10.3)
Chloride: 105 mmol/L (ref 98–111)
Creatinine, Ser: 0.86 mg/dL (ref 0.44–1.00)
GFR, Estimated: >60 mL/min (ref >60)
Glucose, Bld: 88 mg/dL (ref 70–99)
Potassium: 3.7 mmol/L (ref 3.5–5.1)
Sodium: 142 mmol/L (ref 135–145)
Total Bilirubin: 0.3 mg/dL (ref 0.3–1.2)
Total Protein: 7.4 g/dL (ref 6.5–8.1)

## 2020-10-16 LAB — CBC WITH DIFFERENTIAL/PLATELET
Abs Immature Granulocytes: 0.01 10*3/uL (ref 0.00–0.07)
Basophils Absolute: 0 10*3/uL (ref 0.0–0.1)
Basophils Relative: 1 %
Eosinophils Absolute: 0.1 10*3/uL (ref 0.0–0.5)
Eosinophils Relative: 1 %
HCT: 40.3 % (ref 36.0–46.0)
Hemoglobin: 12.7 g/dL (ref 12.0–15.0)
Immature Granulocytes: 0 %
Lymphocytes Relative: 36 %
Lymphs Abs: 2.2 10*3/uL (ref 0.7–4.0)
MCH: 25.3 pg — ABNORMAL LOW (ref 26.0–34.0)
MCHC: 31.5 g/dL (ref 30.0–36.0)
MCV: 80.3 fL (ref 80.0–100.0)
Monocytes Absolute: 0.5 10*3/uL (ref 0.1–1.0)
Monocytes Relative: 8 %
Neutro Abs: 3.2 10*3/uL (ref 1.7–7.7)
Neutrophils Relative %: 54 %
Platelets: 185 10*3/uL (ref 150–400)
RBC: 5.02 MIL/uL (ref 3.87–5.11)
RDW: 23.2 % — ABNORMAL HIGH (ref 11.5–15.5)
WBC: 6 10*3/uL (ref 4.0–10.5)
nRBC: 0 % (ref 0.0–0.2)

## 2020-10-16 NOTE — Progress Notes (Signed)
[facility] CONSULT NOTE  Patient Care Team: Hoyt Koch, MD as PCP - General (Internal Medicine) Bensimhon, Shaune Pascal, MD as PCP - Advanced Heart Failure (Cardiology) Belva Crome, MD as PCP - Cardiology (Cardiology) Evans Lance, MD as PCP - Electrophysiology (Cardiology) Tanda Rockers, MD (Pulmonary Disease) Chucky May, MD (Psychiatry) Lelon Perla, MD (Cardiology) Rigoberto Noel, MD (Pulmonary Disease) Charlton Haws, [facility] as Pharmacist (Pharmacist)  CHIEF COMPLAINTS/PURPOSE OF CONSULTATION:  Tonia Ghent, follow up after iron infusion  ASSESSMENT & PLAN:  Iron deficiency anemia due to chronic blood loss This is a very pleasant 45 year old female patient with past medical history significant for iron deficiency anemia from chronic blood loss secondary to menorrhagia and possible malabsorption status post intravenous iron supplementation who is here for follow-up.  She did notice some improvement in her energy after her iron infusion up until 2 weeks ago when she started noticing that she is tired again.  She continues to have heavy menstrual cycles.  She has previously talked to the OB/GYN team about considering some form of birth control but apparently they were worried about the cardiomyopathy. She denies any other symptoms such as chest pain or chest pressure, pica, change in bowel habits or urinary habits today. Physical examination unremarkable, no major concerns. We will repeat labs today and review any additional recommendations. She will otherwise return to clinic in about 4 months with repeat labs.  She is in agreement with these recommendations.  Orders Placed This Encounter  Procedures  . CBC with Differential/Platelet  . Iron and TIBC    Standing Status:   Future    Number of Occurrences:   1    Standing Expiration Date:   10/16/2021  . Ferritin    Standing Status:   Future    Number of Occurrences:   1    Standing Expiration Date:    10/16/2021    HISTORY OF PRESENTING ILLNESS:  [name] 45 y.o. female is here because of IDA  Interval History  Ms Christian is here for follow up. Since her last visit, she has noticed some improvement after iron infusion until about 2 weeks ago.  She started noticing more fatigue even at rest for the past 2 weeks but she cannot tell if this is from her heart disease versus iron deficiency.  Menstrual cycles are regular and heavy.  She has not been taking any oral iron supplementation currently.  No pica. No change in bowel habits or urinary habits. She does take some bariatric vitamin supplementation. Rest of the pertinent 10 point ROS reviewed and negative.  MEDICAL HISTORY:  Past Medical History:  Diagnosis Date  . Acute on chronic systolic (congestive) heart failure ([place]) 04/29/2017  . Acute pain of right shoulder 06/16/2017  . AICD (automatic cardioverter/defibrillator) present   . Anxiety   . Arthritis    right shoulder   . Asthma   . CHF (congestive heart failure) ([place])   . Chronic combined systolic and diastolic heart failure ([place]) 03/05/2014  . Closed low lateral malleolus fracture 10/23/2013  . Cystitis 10/21/2017  . Depression   . Depression with anxiety 01/20/2013  . Diabetes mellitus without complication ([place])   . Diverticulosis   . Dyspnea    comes and goes intermittently mostly with exertion   . Essential hypertension    Prev followed by [facility]/ Cardiology   . Fibroid    age 35  . Gallstones   . Generalized abdominal  cramping   . History of cardiomyopathy   . Hypertension   . IBS (irritable bowel syndrome)   . 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 Insomnia 05/07/2017  . Labral tear of shoulder 04/04/2015    Injected 04/04/2015 Injected 12/03/2015   . Migraine    "monthly" (08/03/2016)  . Myofascial pain 06/16/2017  . NICM (nonischemic cardiomyopathy) ([place]) 08/03/2016  . Nonallopathic lesion of lumbosacral region  11/16/2016  . Nonallopathic lesion of sacral region 11/16/2016  . Nonallopathic lesion of thoracic region 08/20/2014  . Nonspecific chest pain 04/28/2017  . OSA (obstructive sleep apnea) 01/02/2013   NPSG 2009:  AHI 9/hr. CPAP intolerance >> "smothering" Good tolerance of auto device (optimal pressure 12-13 on download).  - referred to Dr Gwenette Greet    . OSA on CPAP   . Ovarian cyst    1999; surgically removed  . Patellofemoral syndrome of both knees 10/16/2016  . 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 PVC (premature ventricular contraction) 06/23/2016  . Seizures ([place])    "as a child" (08/03/2016)  . Termination of pregnancy    due to cardiac risk    SURGICAL HISTORY: Past Surgical History:  Procedure Laterality Date  . CARDIAC CATHETERIZATION N/A 11/11/2015   Procedure: Right/Left Heart Cath and Coronary Angiography;  Surgeon: Troy Sine, MD;  Location: [place] CV LAB;  Service: Cardiovascular;  Laterality: N/A;  . CARDIAC CATHETERIZATION  ~ 2015  . CARDIAC DEFIBRILLATOR PLACEMENT  08/03/2016  . CESAREAN SECTION  1999  . COLONOSCOPY WITH PROPOFOL N/A 04/21/2016   Procedure: COLONOSCOPY WITH PROPOFOL;  Surgeon: Jerene Bears, MD;  Location: [facility];  Service: Gastroenterology;  Laterality: N/A;  . ESOPHAGOGASTRODUODENOSCOPY (EGD) WITH PROPOFOL N/A 04/21/2016   Procedure: ESOPHAGOGASTRODUODENOSCOPY (EGD) WITH PROPOFOL;  Surgeon: Jerene Bears, MD;  Location: [facility];  Service: Gastroenterology;  Laterality: N/A;  . FOOT FRACTURE SURGERY Right ~ 2003  . FRACTURE SURGERY    . ICD IMPLANT N/A 08/03/2016   Procedure: ICD Implant;  Surgeon: Deboraha Sprang, MD;  Location: [place] CV LAB;  Service: Cardiovascular;  Laterality: N/A;  . LAPAROSCOPIC CHOLECYSTECTOMY  12/2006  . LAPAROSCOPIC GASTRIC SLEEVE RESECTION    . LAPAROSCOPY ABDOMEN DIAGNOSTIC  2008   "cut bile duct w/gallbladder OR; had to go in later & fix leak; hospitalized for 2 months"  . LEFT  HEART CATHETERIZATION WITH CORONARY ANGIOGRAM N/A 02/26/2014   Procedure: LEFT HEART CATHETERIZATION WITH CORONARY ANGIOGRAM;  Surgeon: Jettie Booze, MD;  Location: [facility] C[facility] LAB;  Service: Cardiovascular;  Laterality: N/A;  . OVARIAN CYST REMOVAL Right 1999  . PVC ABLATION N/A 12/01/2017   Procedure: PVC ABLATION;  Surgeon: Evans Lance, MD;  Location: [place] CV LAB;  Service: Cardiovascular;  Laterality: N/A;  . RIGHT HEART CATH N/A 08/05/2017   Procedure: RIGHT HEART CATH;  Surgeon: Jolaine Artist, MD;  Location: [facility];  Service: Cardiovascular;  Laterality: N/A;  . RIGHT HEART CATH N/A 11/16/2019   Procedure: RIGHT HEART CATH;  Surgeon: Jolaine Artist, MD;  Location: [place] CV LAB;  Service: Cardiovascular;  Laterality: N/A;  . TUBAL LIGATION  1999    SOCIAL HISTORY: Social History   Socioeconomic History  . Marital status: Married    Spouse name: Not on file  . Number of children: 1  . Years of education: Not on file  . Highest education level: Not on file  Occupational History  . Occupation:  stay at home mom  Tobacco Use  . Smoking status: Never Smoker  . Smokeless tobacco: Never Used  Vaping Use  . Vaping Use: Never used  Substance and Sexual Activity  . Alcohol use: No  . Drug use: No  . Sexual activity: Yes  Other Topics Concern  . Not on file  Social History Narrative  . Not on file   Social Determinants of Health   Financial Resource Strain: Not on file  Food Insecurity: Not on file  Transportation Needs: Not on file  Physical Activity: Insufficiently Active  . Days of Exercise per Week: 2 days  . Minutes of Exercise per Session: 40 min  Stress: Not on file  Social Connections: Socially Isolated  . Frequency of Communication with Friends and Family: Twice a week  . Frequency of Social Gatherings with Friends and Family: Never  . Attends Religious Services: Never  . Active Member of Clubs or Organizations: No  . Attends  Archivist Meetings: Never  . Marital Status: Married  Human resources officer Violence: Not on file    FAMILY HISTORY: Family History  Problem Relation Age of Onset  . Emphysema Maternal Grandmother        smoked  . Heart disease Maternal Grandmother 28       MI  . Rheum arthritis Mother   . Allergies Daughter   . Colon cancer Neg Hx     ALLERGIES:  is allergic to vancomycin, aspirin, contrast media [iodinated diagnostic agents], avocado, cyclobenzaprine, oxycodone, ciprofloxacin, farxiga [dapagliflozin], and sulfa antibiotics.  MEDICATIONS:  Current Outpatient Medications  Medication Sig Dispense Refill  . albuterol (PROAIR HFA) 108 (90 Base) MCG/ACT inhaler Inhale 1-2 puffs into the lungs every 6 (six) hours as needed for wheezing or shortness of breath. 18 g 5  . amiodarone (PACERONE) 200 MG tablet Take 1 tablet (200 mg total) by mouth 2 (two) times daily. 60 tablet 2  . budesonide-formoterol (SYMBICORT) 160-4.5 MCG/ACT inhaler Inhale 2 puffs into the lungs 2 (two) times daily as needed. 30.6 each 3  . cyanocobalamin (,VITAMIN B-12,) 1000 MCG/ML injection Inject 1 mL (1,000 mcg total) into the muscle as directed. Once daily for 7 days, then once weekly for 4 weeks followed by once monthly 11 mL 1  . diclofenac (VOLTAREN) 75 MG EC tablet Take 1 tablet (75 mg total) by mouth 2 (two) times daily as needed. 30 tablet 0  . diclofenac Sodium (VOLTAREN) 1 % GEL Apply 4 g topically 4 (four) times daily. 300 g 6  . dicyclomine (BENTYL) 20 MG tablet Take 1 tablet (20 mg total) by mouth daily as needed for spasms. 90 tablet 3  . empagliflozin (JARDIANCE) 10 MG TABS tablet Take 1 tablet (10 mg total) by mouth daily. 30 tablet 3  . fluticasone (FLONASE) 50 MCG/ACT nasal spray Place 2 sprays into both nostrils daily as needed for allergies. 48 g 0  . gabapentin (NEURONTIN) 300 MG capsule Take 1 capsule (300 mg total) by mouth at bedtime as needed (back spasms). 90 capsule 3  . ivabradine  (CORLANOR) 7.5 MG TABS tablet Take 1 tablet (7.5 mg total) by mouth 2 (two) times daily with a meal. 60 tablet 2  . lidocaine (LIDODERM) 5 % Place 1 patch onto the skin daily. Remove & Discard patch within 12 hours or as directed by MD 90 patch 3  . Magnesium Oxide 200 MG TABS Take 1 tablet (200 mg total) by mouth daily. 90 tablet 3  . metoprolol succinate (  TOPROL-XL) 50 MG 24 hr tablet Take 1 tablet (50 mg total) by mouth daily. Take with or immediately following a meal. 30 tablet 3  . montelukast (SINGULAIR) 10 MG tablet Take 1 tablet (10 mg total) by mouth at bedtime. 30 tablet 3  . Multiple Vitamins-Minerals (MULTIVITAMIN GUMMIES WOMENS PO) Take 2 tablets by mouth at bedtime.     . nitroGLYCERIN (NITROSTAT) 0.4 MG SL tablet Place 1 tablet (0.4 mg total) under the tongue every 5 (five) minutes as needed for chest pain. 75 tablet 0  . omeprazole (PRILOSEC) 20 MG capsule Take 1 capsule (20 mg total) by mouth daily as needed (for reflux symptoms). 30 capsule 0  . spironolactone (ALDACTONE) 25 MG tablet Take 0.5 tablets (12.5 mg total) by mouth at bedtime. 90 tablet 3  . tiZANidine (ZANAFLEX) 4 MG tablet Take 4 mg by mouth every 8 (eight) hours as needed for muscle spasms.     Marland Kitchen torsemide (DEMADEX) 20 MG tablet Take 1 tablet (20 mg total) by mouth daily. 30 tablet 3  . Vitamin D, Ergocalciferol, (DRISDOL) 1.25 MG (50000 UNIT) CAPS capsule Take 1 capsule (50,000 Units total) by mouth every 7 (seven) days. 12 capsule 0  . ipratropium-albuterol (DUONEB) 0.5-2.5 (3) MG/3ML SOLN Take 3 mLs by nebulization every 6 (six) hours as needed. 360 mL 0  . sacubitril-valsartan (ENTRESTO) 97-103 MG Take 1 tablet by mouth 2 (two) times daily. HOLD until follow-up with cardiology 60 tablet 2   No current facility-administered medications for this visit.    PHYSICAL EXAMINATION: ECOG PERFORMANCE STATUS: 1 - Symptomatic but completely ambulatory  Vitals:   10/16/20 1457  BP: 123/83  Pulse: 69  Resp: 18  Temp:  98 F (36.7 C)  SpO2: 100%   Filed Weights   10/16/20 1457  Weight: 229 lb 12.8 oz (104.2 kg)    GENERAL:alert, no distress and comfortable SKIN: skin color, texture, turgor are normal, no rashes or significant lesions EYES: normal sclera clear OROPHARYNX:no exudate, no erythema and lips, buccal mucosa, and tongue normal  NECK: supple, thyroid normal size, non-tender, without nodularity. No lymphadenopathy. LUNGS: clear to auscultation and percussion with normal breathing effort. HEART: regular rate & rhythm and no murmurs and no lower extremity edema ABDOMEN:abdomen soft, non-tender and normal bowel sounds Musculoskeletal:no cyanosis of digits and no clubbing  PSYCH: alert & oriented x 3 with fluent speech NEURO: no focal motor/sensory deficits  LABORATORY DATA:  I have reviewed the data as listed Lab Results  Component Value Date   WBC 6.0 10/16/2020   HGB 12.7 10/16/2020   HCT 40.3 10/16/2020   MCV 80.3 10/16/2020   PLT 185 10/16/2020     Chemistry      Component Value Date/Time   NA 140 10/03/2020 1433   NA 140 09/06/2020 0938   K 4.0 10/03/2020 1433   CL 108 10/03/2020 1433   CO2 28 10/03/2020 1433   BUN <5 (L) 10/03/2020 1433   BUN 9 09/06/2020 0938   CREATININE 0.72 10/03/2020 1433   CREATININE 0.90 03/26/2016 1536      Component Value Date/Time   CALCIUM 8.7 (L) 10/03/2020 1433   ALKPHOS 92 08/26/2020 0201   AST 22 08/26/2020 0201   ALT 15 08/26/2020 0201   BILITOT 0.4 08/26/2020 0201   BILITOT 0.4 09/07/2019 1159       RADIOGRAPHIC STUDIES: I have personally reviewed the radiological images as listed and agreed with the findings in the report. ECHOCARDIOGRAM COMPLETE  Result Date: 10/03/2020  ECHOCARDIOGRAM REPORT   Patient Name:   [name] Date of Exam: 10/03/2020 Medical Rec #:  [id number]      Height:       65.0 in Accession #:    [id number]     Weight:       228.6 lb Date of Birth:  11/06/1975      BSA:          2.094 m Patient Age:    78  years       BP:           168/93 mmHg Patient Gender: F              HR:           75 bpm. Exam Location:  Outpatient Procedure: 2D Echo, Cardiac Doppler and Color Doppler Indications:    Congestive Heart Failure 428.0 / I50.9  History:        Patient has prior history of Echocardiogram examinations, most                 recent 08/26/2020. Cardiomyopathy and CHF; Risk                 Factors:Hypertension and Diabetes.  Sonographer:    Bernadene Person RDCS Referring Phys: [place]  1. Left ventricular ejection fraction, by estimation, is 30 to 35%. The left ventricle has moderately decreased function. The left ventricle demonstrates global hypokinesis. The left ventricular internal cavity size was mildly to moderately dilated. Left ventricular diastolic parameters are consistent with Grade III diastolic dysfunction (restrictive). Elevated left ventricular end-diastolic pressure.  2. Right ventricular systolic function is normal. The right ventricular size is normal. There is mildly elevated pulmonary artery systolic pressure. The estimated right ventricular systolic pressure is 43.1 mmHg.  3. Left atrial size was mildly dilated.  4. The mitral valve is normal in structure. Mild mitral valve regurgitation. No evidence of mitral stenosis.  5. The aortic valve is normal in structure. Aortic valve regurgitation is not visualized. No aortic stenosis is present.  6. Aortic dilatation noted. There is borderline dilatation of the aortic root, measuring 37 mm.  7. The inferior vena cava is normal in size with greater than 50% respiratory variability, suggesting right atrial pressure of 3 mmHg. FINDINGS  Left Ventricle: Left ventricular ejection fraction, by estimation, is 30 to 35%. The left ventricle has moderately decreased function. The left ventricle demonstrates global hypokinesis. The left ventricular internal cavity size was mildly to moderately  dilated. There is no left ventricular hypertrophy. Left  ventricular diastolic parameters are consistent with Grade III diastolic dysfunction (restrictive). Elevated left ventricular end-diastolic pressure. Right Ventricle: The right ventricular size is normal. No increase in right ventricular wall thickness. Right ventricular systolic function is normal. There is mildly elevated pulmonary artery systolic pressure. The tricuspid regurgitant velocity is 2.76  m/s, and with an assumed right atrial pressure of 8 mmHg, the estimated right ventricular systolic pressure is 54.0 mmHg. Left Atrium: Left atrial size was mildly dilated. Right Atrium: Right atrial size was normal in size. Pericardium: There is no evidence of pericardial effusion. Mitral Valve: The mitral valve is normal in structure. Mild mitral valve regurgitation. No evidence of mitral valve stenosis. Tricuspid Valve: The tricuspid valve is normal in structure. Tricuspid valve regurgitation is mild . No evidence of tricuspid stenosis. Aortic Valve: The aortic valve is normal in structure. Aortic valve regurgitation is not visualized. No aortic stenosis is present. Pulmonic Valve: The  pulmonic valve was normal in structure. Pulmonic valve regurgitation is trivial. No evidence of pulmonic stenosis. Aorta: Aortic dilatation noted. There is borderline dilatation of the aortic root, measuring 37 mm. Venous: The inferior vena cava is normal in size with greater than 50% respiratory variability, suggesting right atrial pressure of 3 mmHg. IAS/Shunts: No atrial level shunt detected by color flow Doppler. Additional Comments: A device lead is visualized.  LEFT VENTRICLE PLAX 2D LVIDd:         5.80 cm      Diastology LVIDs:         4.80 cm      LV e' medial:    6.60 cm/s LV PW:         0.90 cm      LV E/e' medial:  16.8 LV IVS:        0.90 cm      LV e' lateral:   9.75 cm/s LVOT diam:     2.10 cm      LV E/e' lateral: 11.4 LV SV:         57 LV SV Index:   27 LVOT Area:     3.46 cm  LV Volumes (MOD) LV vol d, MOD A2C: 158.0  ml LV vol d, MOD A4C: 125.0 ml LV vol s, MOD A2C: 96.8 ml LV vol s, MOD A4C: 85.0 ml LV SV MOD A2C:     61.2 ml LV SV MOD A4C:     125.0 ml LV SV MOD BP:      59.7 ml RIGHT VENTRICLE RV S prime:     11.30 cm/s TAPSE (M-mode): 2.3 cm LEFT ATRIUM             Index       RIGHT ATRIUM           Index LA diam:        4.00 cm 1.91 cm/m  RA Area:     15.20 cm LA Vol (A2C):   81.6 ml 38.97 ml/m RA Volume:   36.50 ml  17.43 ml/m LA Vol (A4C):   79.1 ml 37.78 ml/m LA Biplane Vol: 86.4 ml 41.27 ml/m  AORTIC VALVE LVOT Vmax:   75.90 cm/s LVOT Vmean:  55.700 cm/s LVOT VTI:    0.164 m  AORTA Ao Root diam: 3.70 cm Ao Asc diam:  3.40 cm MITRAL VALVE                TRICUSPID VALVE MV Area (PHT): 2.91 cm     TR Peak grad:   30.5 mmHg MV Decel Time: 261 msec     TR Vmax:        276.00 cm/s MV E velocity: 111.00 cm/s MV A velocity: 39.80 cm/s   SHUNTS MV E/A ratio:  2.79         Systemic VTI:  0.16 m                             Systemic Diam: 2.10 cm Fransico Him MD Electronically signed by Fransico Him MD Signature Date/Time: 10/03/2020/9:16:05 PM    Final    CUP PACEART REMOTE DEVICE CHECK  Result Date: 09/17/2020 Scheduled remote reviewed. Normal device function.  Next remote 91 days- JBox, RN/CVRS  VAS US CAROTID  Result Date: 10/04/2020 Carotid Arterial Duplex Study Patient Name:  BRISTYN KULESZA  Date of Exam:   10/03/2020 Medical Rec #: [id number]  Accession #:    [id number] Date of Birth: 1976/05/03       Patient Gender: F Patient Age:   33Y Exam Location:  [facility] Procedure:      VAS US CAROTID Referring Phys: [id number] Thompson Grayer --------------------------------------------------------------------------------  Indications:       [employer] research. Risk Factors:      Hypertension, Diabetes. Other Factors:     Chronic combined systolic and diastolic heart failure. Comparison Study:  No prior studies. Performing Technologist: Darlin Coco RDMS,RVT  Examination Guidelines: A complete evaluation includes  B-mode imaging, spectral Doppler, color Doppler, and power Doppler as needed of all accessible portions of each vessel. Bilateral testing is considered an integral part of a complete examination. Limited examinations for reoccurring indications may be performed as noted.  Right Carotid Findings: +----------+--------+--------+--------+------------------+--------+           PSV cm/sEDV cm/sStenosisPlaque DescriptionComments +----------+--------+--------+--------+------------------+--------+ CCA Prox  84      19                                         +----------+--------+--------+--------+------------------+--------+ CCA Distal68      24                                         +----------+--------+--------+--------+------------------+--------+ ICA Prox  66      34                                         +----------+--------+--------+--------+------------------+--------+ ICA Distal131     69                                         +----------+--------+--------+--------+------------------+--------+ ECA       44      11                                         +----------+--------+--------+--------+------------------+--------+ +----------+--------+-------+----------------+-------------------+           PSV cm/sEDV cmsDescribe        Arm Pressure (mmHG) +----------+--------+-------+----------------+-------------------+ Subclavian100            Multiphasic, WNL                    +----------+--------+-------+----------------+-------------------+ +---------+--------+--+--------+--+---------+ VertebralPSV cm/s50EDV cm/s22Antegrade +---------+--------+--+--------+--+---------+ Right CCAD TO BIFUR 0.86 mm, BIFUR to PROX ICA 0.62 mm Left Carotid Findings: +----------+--------+--------+--------+------------------+--------+           PSV cm/sEDV cm/sStenosisPlaque DescriptionComments +----------+--------+--------+--------+------------------+--------+ CCA Prox   100     32                                         +----------+--------+--------+--------+------------------+--------+ CCA Distal82      35                                         +----------+--------+--------+--------+------------------+--------+  ICA Prox  56      29                                         +----------+--------+--------+--------+------------------+--------+ ICA Distal119     68                                         +----------+--------+--------+--------+------------------+--------+ ECA       73      16                                         +----------+--------+--------+--------+------------------+--------+ +----------+--------+--------+----------------+-------------------+           PSV cm/sEDV cm/sDescribe        Arm Pressure (mmHG) +----------+--------+--------+----------------+-------------------+ Subclavian116             Multiphasic, WNL                    +----------+--------+--------+----------------+-------------------+ +---------+--------+--+--------+--+---------+ VertebralPSV cm/s63EDV cm/s34Antegrade +---------+--------+--+--------+--+---------+ LEFT CCAD TO BIFUR 0.61 mm, BIFUR TO PROX ICA 0.46 mm  Summary: Right Carotid: The extracranial vessels were near-normal with only minimal wall                thickening or plaque. Left Carotid: The extracranial vessels were near-normal with only minimal wall               thickening or plaque. Vertebrals:  Bilateral vertebral arteries demonstrate antegrade flow. Subclavians: Normal flow hemodynamics were seen in bilateral subclavian              arteries. *See table(s) above for measurements and observations.  Electronically signed by Servando Snare MD on 10/04/2020 at 4:57:32 PM.    Final     All questions were answered. The patient knows to call the clinic with any problems, questions or concerns.      Benay Pike, MD 10/16/2020 4:19 PM

## 2020-10-16 NOTE — Assessment & Plan Note (Signed)
This is a very pleasant 45 year old female patient with past medical history significant for iron deficiency anemia from chronic blood loss secondary to menorrhagia and possible malabsorption status post intravenous iron supplementation who is here for follow-up.  She did notice some improvement in her energy after her iron infusion up until 2 weeks ago when she started noticing that she is tired again.  She continues to have heavy menstrual cycles.  She has previously talked to the OB/GYN team about considering some form of birth control but apparently they were worried about the cardiomyopathy. She denies any other symptoms such as chest pain or chest pressure, pica, change in bowel habits or urinary habits today. Physical examination unremarkable, no major concerns. We will repeat labs today and review any additional recommendations. She will otherwise return to clinic in about 4 months with repeat labs.  She is in agreement with these recommendations.

## 2020-10-16 NOTE — Progress Notes (Signed)
EPIC Encounter for ICM Monitoring  Patient Name: [name] is a 45 y.o. female Date: 10/16/2020 Primary Care Physican: Hoyt Koch, MD Primary Cardiologist:Bensimhon Electrophysiologist: Caryl Comes 4/12/2022OfficeWeight:230lbs  Transmission reviewed and results sent via mychart.  Scheduled 11/08/2020 for Barostim implant.  HeartlogicHFIndex0suggesting normalfluid levels.  Prescribed:  Torsemide20 mgTake1tablet (20 mg total) by mouthdaily.  Spironolactone 25 mg take 0.5 tablet (12.5 mg total) daily.  Labs: 10/03/2020 Creatinine 0.72, BUN <5, Potassium 4.0, Sodium 140 09/06/2020 Creatinine0.89, BUN9,   Potassium4.3, Sodium140, GFR 81 09/03/2020 Creatinine0.86, BUN<5, Potassium3.7, Sodium137, GFR>60  08/26/2020 Creatinine0.89, BUN11, Potassium4.7, Sodium136, GFR>60  08/25/2020 Creatinine0.69, BUN8,   Potassium3.8, Sodium136, GFR>60  08/06/2020 Creatinine0.87, BUN17, Potassium4.0, Sodium143, GFR>60  07/29/2020 Creatinine0.72, BUN10, Potassium3.5, Sodium142 A complete set of results can be found in Results Review.  Recommendations:No changes.  Follow-up plan: ICM clinic phone appointment on6/27/2022.91 day device clinic remote transmission7/26/2022.   EP/Cardiology next office visit: 10/03/2020 with HF clinic NP/PA  Copy of ICM check sent to Dr. Caryl Comes.  3 Month Trend    8 Day Data Trend          [name], RN 10/16/2020 7:47 AM

## 2020-10-17 ENCOUNTER — Ambulatory Visit: Payer: HMO | Admitting: Internal Medicine

## 2020-10-17 LAB — IRON AND TIBC
Iron: 80 ug/dL (ref 41–142)
Saturation Ratios: 22 % (ref 21–57)
TIBC: 358 ug/dL (ref 236–444)
UIBC: 278 ug/dL (ref 120–384)

## 2020-10-17 LAB — FERRITIN: Ferritin: 43 ng/mL (ref 11–307)

## 2020-10-18 ENCOUNTER — Telehealth: Payer: Self-pay

## 2020-10-18 NOTE — Telephone Encounter (Signed)
Patient has been made aware of her lab results and verbalized understanding.

## 2020-10-18 NOTE — Telephone Encounter (Signed)
-----   Message from Benay Pike, MD sent at 10/17/2020  4:53 PM EDT ----- Please let patient know iron numbers are significantly better.

## 2020-10-22 ENCOUNTER — Ambulatory Visit: Payer: HMO | Admitting: Internal Medicine

## 2020-11-06 ENCOUNTER — Encounter (HOSPITAL_COMMUNITY)
Admission: RE | Admit: 2020-11-06 | Discharge: 2020-11-06 | Disposition: A | Discharge status: Home / Self Care | Payer: HMO | Source: Ambulatory Visit | Attending: Surgery | Admitting: Surgery

## 2020-11-06 ENCOUNTER — Other Ambulatory Visit (HOSPITAL_COMMUNITY): Payer: HMO

## 2020-11-06 ENCOUNTER — Other Ambulatory Visit: Payer: Self-pay

## 2020-11-06 ENCOUNTER — Encounter (HOSPITAL_COMMUNITY): Payer: Self-pay

## 2020-11-06 DIAGNOSIS — Z20822 Contact with and (suspected) exposure to covid-19: Secondary | ICD-10-CM | POA: Insufficient documentation

## 2020-11-06 DIAGNOSIS — Z01812 Encounter for preprocedural laboratory examination: Secondary | ICD-10-CM | POA: Diagnosis not present

## 2020-11-06 HISTORY — DX: Cardiac arrhythmia, unspecified: I49.9

## 2020-11-06 HISTORY — DX: Angina pectoris, unspecified: I20.9

## 2020-11-06 LAB — URINALYSIS, ROUTINE W REFLEX MICROSCOPIC
Bilirubin Urine: NEGATIVE
Glucose, UA: NEGATIVE mg/dL
Hgb urine dipstick: NEGATIVE
Ketones, ur: NEGATIVE mg/dL
Leukocytes,Ua: NEGATIVE
Nitrite: NEGATIVE
Protein, ur: 30 mg/dL — AB
Specific Gravity, Urine: 1.023 (ref 1.005–1.030)
pH: 5 (ref 5.0–8.0)

## 2020-11-06 LAB — CBC
HCT: 42 % (ref 36.0–46.0)
Hemoglobin: 13.3 g/dL (ref 12.0–15.0)
MCH: 26.8 pg (ref 26.0–34.0)
MCHC: 31.7 g/dL (ref 30.0–36.0)
MCV: 84.7 fL (ref 80.0–100.0)
Platelets: 198 10*3/uL (ref 150–400)
RBC: 4.96 MIL/uL (ref 3.87–5.11)
RDW: 20.5 % — ABNORMAL HIGH (ref 11.5–15.5)
WBC: 5.3 10*3/uL (ref 4.0–10.5)
nRBC: 0 % (ref 0.0–0.2)

## 2020-11-06 LAB — COMPREHENSIVE METABOLIC PANEL
ALT: 13 U/L (ref 0–44)
AST: 20 U/L (ref 15–41)
Albumin: 3.9 g/dL (ref 3.5–5.0)
Alkaline Phosphatase: 63 U/L (ref 38–126)
Anion gap: 7 (ref 5–15)
BUN: <5 mg/dL — ABNORMAL LOW (ref 6–20)
CO2: 26 mmol/L (ref 22–32)
Calcium: 9.1 mg/dL (ref 8.9–10.3)
Chloride: 104 mmol/L (ref 98–111)
Creatinine, Ser: 0.78 mg/dL (ref 0.44–1.00)
GFR, Estimated: >60 mL/min (ref >60)
Glucose, Bld: 97 mg/dL (ref 70–99)
Potassium: 3.3 mmol/L — ABNORMAL LOW (ref 3.5–5.1)
Sodium: 137 mmol/L (ref 135–145)
Total Bilirubin: 0.5 mg/dL (ref 0.3–1.2)
Total Protein: 7.1 g/dL (ref 6.5–8.1)

## 2020-11-06 LAB — APTT: aPTT: 36 seconds (ref 24–36)

## 2020-11-06 LAB — TYPE AND SCREEN
ABO/RH(D): O POS
Antibody Screen: NEGATIVE

## 2020-11-06 LAB — SURGICAL PCR SCREEN
MRSA, PCR: NEGATIVE
Staphylococcus aureus: NEGATIVE

## 2020-11-06 LAB — PROTIME-INR
INR: 1 (ref 0.8–1.2)
Prothrombin Time: 13.6 seconds (ref 11.4–15.2)

## 2020-11-06 LAB — SARS CORONAVIRUS 2 (TAT 6-24 HRS): SARS Coronavirus 2: NEGATIVE

## 2020-11-06 NOTE — Progress Notes (Signed)
PCP - Dr. Pricilla Holm Cardiologist - Dr. Glori Bickers  ICD - [facility] Scientific Rep Notified - JOey to be present on day of surgery  Chest x-ray - 08/25/20 EKG - 08/25/20 Stress Test - 12/19/19 ECHO - 10/03/20 Cardiac Cath - 11/16/19  Sleep Study - Yes  does not have OSA anymore   DM-Denies  COVID TEST- 11/06/20   Anesthesia review: Yes  Patient denies shortness of breath, fever, cough and chest pain at PAT appointment   All instructions explained to the patient, with a verbal understanding of the material. Patient agrees to go over the instructions while at home for a better understanding. Patient also instructed to wear mask in public after being tested for COVID-19. The opportunity to ask questions was provided.

## 2020-11-06 NOTE — Progress Notes (Addendum)
Lindsi, RN reached out to Philemon Kingdom regarding patient's ICD and was informed that Joey with [employer] was notified about upcoming procedure.   11/06/20 0719 Informed by Barth Kirks, RN that [place] would be present day of surgery.

## 2020-11-06 NOTE — Progress Notes (Signed)
Surgical Instructions    Your procedure is scheduled on Friday June 17th.  Report to [facility] Main Entrance "A" at 5:30 A.M., then check in with the Admitting office.  Call this number if you have problems the morning of surgery:  [phone]   If you have any questions prior to your surgery date call [phone]: Open Monday-Friday 8am-4pm    Remember:  Do not eat or drink anything after midnight the night before your surgery    Take these medicines the morning of surgery with A SIP OF WATER  amiodarone (PACERONE) 200 MG tablet Magnesium Oxide 200 MG TABS ivabradine (CORLANOR) 7.5 MG TABS tablet metoprolol succinate (TOPROL-XL) 50 MG 24 hr tablet  IF NEEDED: albuterol (PROAIR HFA) 108 (90 Base) MCG/ACT inhaler - please bring inhalers with you to the hospital budesonide-formoterol (SYMBICORT) 160-4.5 MCG/ACT inhaler dicyclomine (BENTYL) 20 MG tablet fluticasone (FLONASE) 50 MCG/ACT nasal spray gabapentin (NEURONTIN) 300 MG capsule ipratropium-albuterol (DUONEB) 0.5-2.5 (3) MG/3ML SOLN nitroGLYCERIN (NITROSTAT) 0.4 MG SL tablet omeprazole (PRILOSEC) 20 MG capsule tiZANidine (ZANAFLEX) 4 MG tablet   WHAT DO I DO ABOUT MY DIABETES MEDICATION?   Do not take oral diabetes medicines Jardiance the morning of surgery or the day before surgery   HOW TO MANAGE YOUR DIABETES BEFORE AND AFTER SURGERY  Why is it important to control my blood sugar before and after surgery? Improving blood sugar levels before and after surgery helps healing and can limit problems. A way of improving blood sugar control is eating a healthy diet by:  Eating less sugar and carbohydrates  Increasing activity/exercise  Talking with your doctor about reaching your blood sugar goals High blood sugars (greater than 180 mg/dL) can raise your risk of infections and slow your recovery, so you will need to focus on controlling your diabetes during the weeks before surgery. Make sure that the doctor who  takes care of your diabetes knows about your planned surgery including the date and location.  How do I manage my blood sugar before surgery? Check your blood sugar at least 4 times a day, starting 2 days before surgery, to make sure that the level is not too high or low.  Check your blood sugar the morning of your surgery when you wake up and every 2 hours until you get to the Short Stay unit.  If your blood sugar is less than 70 mg/dL, you will need to treat for low blood sugar: Do not take insulin. Treat a low blood sugar (less than 70 mg/dL) with  cup of clear juice (cranberry or apple), 4 glucose tablets, OR glucose gel. Recheck blood sugar in 15 minutes after treatment (to make sure it is greater than 70 mg/dL). If your blood sugar is not greater than 70 mg/dL on recheck, call [phone] for further instructions. Report your blood sugar to the short stay nurse when you get to Short Stay.  If you are admitted to the hospital after surgery: Your blood sugar will be checked by the staff and you will probably be given insulin after surgery (instead of oral diabetes medicines) to make sure you have good blood sugar levels. The goal for blood sugar control after surgery is 80-180 mg/dL.   As of today, STOP taking any Aleve, Naproxen, Ibuprofen, Motrin, Advil, Goody's, BC's, all herbal medications, fish oil, and all vitamins.          Do not wear jewelry or makeup Do not wear lotions, powders, perfumes, or deodorant. Do not shave 48 hours  prior to surgery.   Do not bring valuables to the hospital. DO Not wear nail polish, gel polish, artificial nails, or any other type of covering on  natural nails including finger and toenails. If patients have artificial nails, gel coating, etc. that need to be removed by a nail salon please have this removed prior to surgery or surgery may need to be canceled/delayed if the surgeon/ anesthesia feels like the patient is unable to be adequately  monitored.             [facility] is not responsible for any belongings or valuables.  Do NOT Smoke (Tobacco/Vaping) or drink Alcohol 24 hours prior to your procedure If you use a CPAP at night, you may bring all equipment for your overnight stay.   Contacts, glasses, dentures or bridgework may not be worn into surgery, please bring cases for these belongings   For patients admitted to the hospital, discharge time will be determined by your treatment team.   Patients discharged the day of surgery will not be allowed to drive home, and someone needs to stay with them for 24 hours.  ONLY 1 SUPPORT PERSON MAY BE PRESENT WHILE YOU ARE IN SURGERY. IF YOU ARE TO BE ADMITTED ONCE YOU ARE IN YOUR ROOM YOU WILL BE ALLOWED TWO (2) VISITORS.  Minor children may have two parents present. Special consideration for safety and communication needs will be reviewed on a case by case basis.  Special instructions:    Oral Hygiene is also important to reduce your risk of infection.  Remember - BRUSH YOUR TEETH THE MORNING OF SURGERY WITH YOUR REGULAR TOOTHPASTE   [place]- Preparing For Surgery  Before surgery, you can play an important role. Because skin is not sterile, your skin needs to be as free of germs as possible. You can reduce the number of germs on your skin by washing with CHG (chlorahexidine gluconate) Soap before surgery.  CHG is an antiseptic cleaner which kills germs and bonds with the skin to continue killing germs even after washing.     Please do not use if you have an allergy to CHG or antibacterial soaps. If your skin becomes reddened/irritated stop using the CHG.  Do not shave (including legs and underarms) for at least 48 hours prior to first CHG shower. It is OK to shave your face.  Please follow these instructions carefully.     Shower the NIGHT BEFORE SURGERY and the MORNING OF SURGERY with CHG Soap.   If you chose to wash your hair, wash your hair first as usual with your  normal shampoo. After you shampoo, rinse your hair and body thoroughly to remove the shampoo.  Then [employer] and genitals (private parts) with your normal soap and rinse thoroughly to remove soap.  After that Use CHG Soap as you would any other liquid soap. You can apply CHG directly to the skin and wash gently with a scrungie or a clean washcloth.   Apply the CHG Soap to your body ONLY FROM THE NECK DOWN.  Do not use on open wounds or open sores. Avoid contact with your eyes, ears, mouth and genitals (private parts). Wash Face and genitals (private parts)  with your normal soap.   Wash thoroughly, paying special attention to the area where your surgery will be performed.  Thoroughly rinse your body with warm water from the neck down.  DO NOT shower/wash with your normal soap after using and rinsing off the CHG Soap.  Pat yourself dry with a CLEAN TOWEL.  Wear CLEAN PAJAMAS to bed the night before surgery  Place CLEAN SHEETS on your bed the night before your surgery  DO NOT SLEEP WITH PETS.   Day of Surgery:  Take a shower with CHG soap. Wear Clean/Comfortable clothing the morning of surgery Do not apply any deodorants/lotions.   Remember to brush your teeth WITH YOUR REGULAR TOOTHPASTE.   Please read over the following fact sheets that you were given.

## 2020-11-07 NOTE — Anesthesia Preprocedure Evaluation (Addendum)
Anesthesia Evaluation  Patient identified by MRN, date of birth, ID band Patient awake    Reviewed: Allergy & Precautions, NPO status , [name] Chart, lab work & pertinent test results  History of Anesthesia Complications Negative for: history of anesthetic complications  Airway Mallampati: II  TM Distance: >3 FB Neck ROM: Full    Dental  (+) Dental Advisory Given, Teeth Intact   Pulmonary shortness of breath, asthma , sleep apnea and Continuous Positive Airway Pressure Ventilation , Patient abstained from smoking.,    Pulmonary exam normal breath sounds clear to auscultation       Cardiovascular hypertension, Pt. on medications and Pt. on home beta blockers pulmonary hypertension(-) angina+CHF  Normal cardiovascular exam+ dysrhythmias + Cardiac Defibrillator  Rhythm:Regular Rate:Normal  Echo 10/03/2020: 1. Left ventricular ejection fraction, by estimation, is 30 to 35%. The left ventricle has moderately decreased function. The left ventricle demonstrates global hypokinesis. The left ventricular internal cavity size was mildly to moderately dilated. Left ventricular diastolic parameters are consistent with Grade III diastolic dysfunction (restrictive). Elevated left ventricular  end-diastolic pressure.  2. Right ventricular systolic function is normal. The right ventricular size is normal. There is mildly elevated pulmonary artery systolic pressure. The estimated right ventricular systolic pressure is 54.5 mmHg.  3. Left atrial size was mildly dilated.  4. The mitral valve is normal in structure. Mild mitral valve  regurgitation. No evidence of mitral stenosis.  5. The aortic valve is normal in structure. Aortic valve regurgitation is not visualized. No aortic stenosis is present.  6. Aortic dilatation noted. There is borderline dilatation of the aortic root, measuring 37 mm.  7. The inferior vena cava is normal in size with  greater than 50% respiratory variability, suggesting right atrial pressure of 3 mmHg.   Neuro/Psych  Headaches, Seizures -,  PSYCHIATRIC DISORDERS Anxiety Depression    GI/Hepatic Neg liver ROS, GERD  Medicated and Controlled,  Endo/Other  diabetesMorbid obesity  Renal/GU negative Renal ROS     Musculoskeletal  (+) Arthritis ,   Abdominal (+) + obese,   Peds  Hematology  (+) Blood dyscrasia, anemia ,   Anesthesia Other Findings   Reproductive/Obstetrics S/p BTL, LMP 10d ago                            Anesthesia Physical  Anesthesia Plan  ASA: 4  Anesthesia Plan: General   Post-op Pain Management:    Induction: Intravenous  PONV Risk Score and Plan: 3 and Ondansetron, Dexamethasone, Treatment may vary due to age or medical condition and Midazolam  Airway Management Planned: Oral ETT  Additional Equipment: Arterial line  Intra-op Plan:   Post-operative Plan: Extubation in OR  Informed Consent: I have reviewed the patients History and Physical, chart, labs and discussed the procedure including the risks, benefits and alternatives for the proposed anesthesia with the patient or authorized representative who has indicated his/her understanding and acceptance.     Dental advisory given  Plan Discussed with: CRNA  Anesthesia Plan Comments:        Anesthesia Quick Evaluation

## 2020-11-08 ENCOUNTER — Encounter (HOSPITAL_COMMUNITY)
Admission: RE | Disposition: A | Discharge status: Home / Self Care | Payer: Self-pay | Source: Home / Self Care | Attending: Surgery

## 2020-11-08 ENCOUNTER — Ambulatory Visit (HOSPITAL_COMMUNITY): Payer: HMO | Admitting: Physician Assistant

## 2020-11-08 ENCOUNTER — Encounter (HOSPITAL_COMMUNITY): Payer: Self-pay | Admitting: Surgery

## 2020-11-08 ENCOUNTER — Ambulatory Visit (HOSPITAL_COMMUNITY): Payer: HMO | Admitting: Anesthesiology

## 2020-11-08 ENCOUNTER — Observation Stay (HOSPITAL_COMMUNITY)
Admission: RE | Admit: 2020-11-08 | Discharge: 2020-11-10 | Disposition: A | Discharge status: Home / Self Care | Payer: HMO | Attending: Surgery | Admitting: Surgery

## 2020-11-08 ENCOUNTER — Other Ambulatory Visit: Payer: Self-pay

## 2020-11-08 DIAGNOSIS — Z886 Allergy status to analgesic agent status: Secondary | ICD-10-CM | POA: Diagnosis not present

## 2020-11-08 DIAGNOSIS — Z882 Allergy status to sulfonamides status: Secondary | ICD-10-CM | POA: Diagnosis not present

## 2020-11-08 DIAGNOSIS — Z9989 Dependence on other enabling machines and devices: Secondary | ICD-10-CM | POA: Diagnosis not present

## 2020-11-08 DIAGNOSIS — I5043 Acute on chronic combined systolic (congestive) and diastolic (congestive) heart failure: Secondary | ICD-10-CM | POA: Diagnosis not present

## 2020-11-08 DIAGNOSIS — I110 Hypertensive heart disease with heart failure: Principal | ICD-10-CM | POA: Insufficient documentation

## 2020-11-08 DIAGNOSIS — Z95810 Presence of automatic (implantable) cardiac defibrillator: Secondary | ICD-10-CM | POA: Diagnosis not present

## 2020-11-08 DIAGNOSIS — Z7984 Long term (current) use of oral hypoglycemic drugs: Secondary | ICD-10-CM | POA: Insufficient documentation

## 2020-11-08 DIAGNOSIS — Z885 Allergy status to narcotic agent status: Secondary | ICD-10-CM | POA: Insufficient documentation

## 2020-11-08 DIAGNOSIS — Z91041 Radiographic dye allergy status: Secondary | ICD-10-CM | POA: Diagnosis not present

## 2020-11-08 DIAGNOSIS — I5022 Chronic systolic (congestive) heart failure: Secondary | ICD-10-CM | POA: Diagnosis not present

## 2020-11-08 DIAGNOSIS — Z888 Allergy status to other drugs, medicaments and biological substances status: Secondary | ICD-10-CM | POA: Diagnosis not present

## 2020-11-08 DIAGNOSIS — E119 Type 2 diabetes mellitus without complications: Secondary | ICD-10-CM | POA: Diagnosis not present

## 2020-11-08 DIAGNOSIS — J45909 Unspecified asthma, uncomplicated: Secondary | ICD-10-CM | POA: Diagnosis not present

## 2020-11-08 DIAGNOSIS — Z79899 Other long term (current) drug therapy: Secondary | ICD-10-CM | POA: Diagnosis not present

## 2020-11-08 DIAGNOSIS — G4733 Obstructive sleep apnea (adult) (pediatric): Secondary | ICD-10-CM | POA: Diagnosis not present

## 2020-11-08 DIAGNOSIS — I509 Heart failure, unspecified: Secondary | ICD-10-CM

## 2020-11-08 LAB — POCT PREGNANCY, URINE: Preg Test, Ur: NEGATIVE

## 2020-11-08 LAB — GLUCOSE, CAPILLARY
Glucose-Capillary: 106 mg/dL — ABNORMAL HIGH (ref 70–99)
Glucose-Capillary: 89 mg/dL (ref 70–99)

## 2020-11-08 LAB — ABO/RH: ABO/RH(D): O POS

## 2020-11-08 SURGERY — INSERTION, CAROTID SINUS BAROREFLEX ACTIVATION DEVICE
Anesthesia: General

## 2020-11-08 MED ORDER — GABAPENTIN 300 MG PO CAPS: 300.0000 mg | ORAL_CAPSULE | Freq: Every evening | ORAL | Status: DC | PRN

## 2020-11-08 MED ORDER — FENTANYL CITRATE (PF) 100 MCG/2ML IJ SOLN
INTRAMUSCULAR | Status: AC
  Filled 2020-11-08: qty 2

## 2020-11-08 MED ORDER — MEPERIDINE HCL 25 MG/ML IJ SOLN: 6.2500 mg | INTRAMUSCULAR | Status: DC | PRN

## 2020-11-08 MED ORDER — MIDAZOLAM HCL 2 MG/2ML IJ SOLN
INTRAMUSCULAR | Status: DC | PRN
  Administered 2020-11-08 (×2): 1 mg via INTRAVENOUS

## 2020-11-08 MED ORDER — ONDANSETRON HCL 4 MG/2ML IJ SOLN: 4.0000 mg | Freq: Four times a day (QID) | INTRAMUSCULAR | Status: DC | PRN

## 2020-11-08 MED ORDER — HYDROMORPHONE HCL 1 MG/ML IJ SOLN
0.5000 mg | INTRAMUSCULAR | Status: DC | PRN
  Administered 2020-11-08 (×2): 0.5 mg via INTRAVENOUS

## 2020-11-08 MED ORDER — CHLORHEXIDINE GLUCONATE CLOTH 2 % EX PADS: 6.0000 | MEDICATED_PAD | Freq: Once | CUTANEOUS | Status: DC

## 2020-11-08 MED ORDER — MOMETASONE FURO-FORMOTEROL FUM 200-5 MCG/ACT IN AERO
2.0000 | INHALATION_SPRAY | Freq: Two times a day (BID) | RESPIRATORY_TRACT | Status: DC
  Filled 2020-11-08: qty 8.8

## 2020-11-08 MED ORDER — FENTANYL CITRATE (PF) 250 MCG/5ML IJ SOLN
INTRAMUSCULAR | Status: DC | PRN
  Administered 2020-11-08 (×4): 50 ug via INTRAVENOUS

## 2020-11-08 MED ORDER — SODIUM CHLORIDE 0.9 % IV SOLN
INTRAVENOUS | Status: AC
  Filled 2020-11-08: qty 1.2

## 2020-11-08 MED ORDER — FENTANYL CITRATE (PF) 100 MCG/2ML IJ SOLN
25.0000 ug | INTRAMUSCULAR | Status: DC | PRN
  Administered 2020-11-08: 25 ug via INTRAVENOUS
  Administered 2020-11-08 (×2): 50 ug via INTRAVENOUS

## 2020-11-08 MED ORDER — ACETAMINOPHEN 650 MG RE SUPP: 325.0000 mg | RECTAL | Status: DC | PRN

## 2020-11-08 MED ORDER — SODIUM CHLORIDE 0.9% FLUSH
3.0000 mL | Freq: Two times a day (BID) | INTRAVENOUS | Status: DC
  Administered 2020-11-08 – 2020-11-10 (×5): 3 mL via INTRAVENOUS

## 2020-11-08 MED ORDER — BUPIVACAINE HCL (PF) 0.5 % IJ SOLN
INTRAMUSCULAR | Status: AC
  Filled 2020-11-08: qty 30

## 2020-11-08 MED ORDER — ALBUTEROL SULFATE (2.5 MG/3ML) 0.083% IN NEBU: 2.5000 mg | INHALATION_SOLUTION | Freq: Four times a day (QID) | RESPIRATORY_TRACT | Status: DC | PRN

## 2020-11-08 MED ORDER — CHLORHEXIDINE GLUCONATE 0.12 % MT SOLN
15.0000 mL | Freq: Once | OROMUCOSAL | Status: AC
  Administered 2020-11-08: 15 mL via OROMUCOSAL

## 2020-11-08 MED ORDER — SODIUM CHLORIDE 0.9 % IV SOLN: INTRAVENOUS | Status: DC

## 2020-11-08 MED ORDER — ORAL CARE MOUTH RINSE: 15.0000 mL | Freq: Once | OROMUCOSAL | Status: AC

## 2020-11-08 MED ORDER — CHLORHEXIDINE GLUCONATE 0.12 % MT SOLN
OROMUCOSAL | Status: AC
  Filled 2020-11-08: qty 15

## 2020-11-08 MED ORDER — METOPROLOL TARTRATE 5 MG/5ML IV SOLN: 2.0000 mg | INTRAVENOUS | Status: DC | PRN

## 2020-11-08 MED ORDER — SODIUM CHLORIDE 0.9 % IV SOLN
0.0125 ug/kg/min | INTRAVENOUS | Status: DC
  Filled 2020-11-08: qty 2000

## 2020-11-08 MED ORDER — EMPAGLIFLOZIN 10 MG PO TABS
10.0000 mg | ORAL_TABLET | Freq: Every day | ORAL | Status: DC
  Administered 2020-11-08 – 2020-11-10 (×3): 10 mg via ORAL
  Filled 2020-11-08 (×3): qty 1

## 2020-11-08 MED ORDER — MIDAZOLAM HCL 2 MG/2ML IJ SOLN
INTRAMUSCULAR | Status: AC
  Filled 2020-11-08: qty 2

## 2020-11-08 MED ORDER — DEXAMETHASONE SODIUM PHOSPHATE 10 MG/ML IJ SOLN
INTRAMUSCULAR | Status: DC | PRN
  Administered 2020-11-08: 8 mg via INTRAVENOUS

## 2020-11-08 MED ORDER — METOPROLOL SUCCINATE ER 50 MG PO TB24
50.0000 mg | ORAL_TABLET | Freq: Every day | ORAL | Status: DC
  Administered 2020-11-09 – 2020-11-10 (×2): 50 mg via ORAL
  Filled 2020-11-08 (×2): qty 1

## 2020-11-08 MED ORDER — PHENYLEPHRINE 40 MCG/ML (10ML) SYRINGE FOR IV PUSH (FOR BLOOD PRESSURE SUPPORT)
PREFILLED_SYRINGE | INTRAVENOUS | Status: AC
  Filled 2020-11-08: qty 10

## 2020-11-08 MED ORDER — GUAIFENESIN-DM 100-10 MG/5ML PO SYRP: 15.0000 mL | ORAL_SOLUTION | ORAL | Status: DC | PRN

## 2020-11-08 MED ORDER — LIDOCAINE 2% (20 MG/ML) 5 ML SYRINGE
INTRAMUSCULAR | Status: DC | PRN
  Administered 2020-11-08: 100 mg via INTRAVENOUS

## 2020-11-08 MED ORDER — ROCURONIUM BROMIDE 10 MG/ML (PF) SYRINGE
PREFILLED_SYRINGE | INTRAVENOUS | Status: DC | PRN
  Administered 2020-11-08: 60 mg via INTRAVENOUS
  Administered 2020-11-08: 10 mg via INTRAVENOUS

## 2020-11-08 MED ORDER — SODIUM CHLORIDE 0.9% FLUSH: 3.0000 mL | INTRAVENOUS | Status: DC | PRN

## 2020-11-08 MED ORDER — AMIODARONE HCL 200 MG PO TABS
200.0000 mg | ORAL_TABLET | Freq: Two times a day (BID) | ORAL | Status: DC
  Administered 2020-11-08 – 2020-11-10 (×4): 200 mg via ORAL
  Filled 2020-11-08 (×4): qty 1

## 2020-11-08 MED ORDER — LACTATED RINGERS IV SOLN: INTRAVENOUS | Status: DC | PRN

## 2020-11-08 MED ORDER — VANCOMYCIN HCL 1000 MG IV SOLR
INTRAVENOUS | Status: DC | PRN
  Administered 2020-11-08: 1000 mL

## 2020-11-08 MED ORDER — OXYCODONE-ACETAMINOPHEN 5-325 MG PO TABS
1.0000 | ORAL_TABLET | ORAL | Status: DC | PRN
  Administered 2020-11-08 – 2020-11-09 (×7): 2 via ORAL
  Administered 2020-11-09: 1 via ORAL
  Administered 2020-11-09 – 2020-11-10 (×3): 2 via ORAL
  Filled 2020-11-08 (×10): qty 2

## 2020-11-08 MED ORDER — LABETALOL HCL 5 MG/ML IV SOLN: 10.0000 mg | INTRAVENOUS | Status: DC | PRN

## 2020-11-08 MED ORDER — PANTOPRAZOLE SODIUM 40 MG PO TBEC
40.0000 mg | DELAYED_RELEASE_TABLET | Freq: Every day | ORAL | Status: DC
  Administered 2020-11-08 – 2020-11-10 (×3): 40 mg via ORAL
  Filled 2020-11-08 (×3): qty 1

## 2020-11-08 MED ORDER — SUGAMMADEX SODIUM 200 MG/2ML IV SOLN
INTRAVENOUS | Status: DC | PRN
  Administered 2020-11-08: 200 mg via INTRAVENOUS

## 2020-11-08 MED ORDER — TORSEMIDE 20 MG PO TABS
20.0000 mg | ORAL_TABLET | Freq: Every day | ORAL | Status: DC
  Administered 2020-11-08 – 2020-11-10 (×3): 20 mg via ORAL
  Filled 2020-11-08 (×3): qty 1

## 2020-11-08 MED ORDER — FENTANYL CITRATE (PF) 250 MCG/5ML IJ SOLN
INTRAMUSCULAR | Status: AC
  Filled 2020-11-08: qty 5

## 2020-11-08 MED ORDER — 0.9 % SODIUM CHLORIDE (POUR BTL) OPTIME
TOPICAL | Status: DC | PRN
  Administered 2020-11-08: 1000 mL

## 2020-11-08 MED ORDER — SPIRONOLACTONE 12.5 MG HALF TABLET
12.5000 mg | ORAL_TABLET | Freq: Every day | ORAL | Status: DC
  Administered 2020-11-08 – 2020-11-09 (×2): 12.5 mg via ORAL
  Filled 2020-11-08 (×3): qty 1

## 2020-11-08 MED ORDER — ONDANSETRON HCL 4 MG/2ML IJ SOLN
INTRAMUSCULAR | Status: AC
  Filled 2020-11-08: qty 2

## 2020-11-08 MED ORDER — POTASSIUM CHLORIDE CRYS ER 20 MEQ PO TBCR
20.0000 meq | EXTENDED_RELEASE_TABLET | Freq: Once | ORAL | Status: AC
  Administered 2020-11-08: 20 meq via ORAL
  Filled 2020-11-08: qty 2

## 2020-11-08 MED ORDER — HYDRALAZINE HCL 20 MG/ML IJ SOLN: 5.0000 mg | INTRAMUSCULAR | Status: DC | PRN

## 2020-11-08 MED ORDER — PHENOL 1.4 % MT LIQD
1.0000 | OROMUCOSAL | Status: DC | PRN
  Administered 2020-11-09: 1 via OROMUCOSAL
  Filled 2020-11-08: qty 177

## 2020-11-08 MED ORDER — ETOMIDATE 2 MG/ML IV SOLN
INTRAVENOUS | Status: DC | PRN
  Administered 2020-11-08: 12 mg via INTRAVENOUS

## 2020-11-08 MED ORDER — OXYCODONE-ACETAMINOPHEN 5-325 MG PO TABS
ORAL_TABLET | ORAL | Status: AC
  Filled 2020-11-08: qty 2

## 2020-11-08 MED ORDER — PROMETHAZINE HCL 25 MG/ML IJ SOLN
INTRAMUSCULAR | Status: AC
  Filled 2020-11-08: qty 1

## 2020-11-08 MED ORDER — ACETAMINOPHEN 325 MG PO TABS
325.0000 mg | ORAL_TABLET | ORAL | Status: DC | PRN
  Administered 2020-11-08: 650 mg via ORAL
  Filled 2020-11-08: qty 2

## 2020-11-08 MED ORDER — HYDROMORPHONE HCL 1 MG/ML IJ SOLN
INTRAMUSCULAR | Status: AC
  Filled 2020-11-08: qty 1

## 2020-11-08 MED ORDER — ALUM & MAG HYDROXIDE-SIMETH 200-200-20 MG/5ML PO SUSP: 15.0000 mL | ORAL | Status: DC | PRN

## 2020-11-08 MED ORDER — CEFAZOLIN SODIUM-DEXTROSE 2-4 GM/100ML-% IV SOLN
2.0000 g | INTRAVENOUS | Status: AC
  Administered 2020-11-08: 2 g via INTRAVENOUS

## 2020-11-08 MED ORDER — DOCUSATE SODIUM 100 MG PO CAPS
100.0000 mg | ORAL_CAPSULE | Freq: Two times a day (BID) | ORAL | Status: DC
  Administered 2020-11-08 – 2020-11-10 (×3): 100 mg via ORAL
  Filled 2020-11-08 (×4): qty 1

## 2020-11-08 MED ORDER — ONDANSETRON HCL 4 MG/2ML IJ SOLN
INTRAMUSCULAR | Status: DC | PRN
  Administered 2020-11-08: 4 mg via INTRAVENOUS

## 2020-11-08 MED ORDER — LIDOCAINE HCL (PF) 1 % IJ SOLN
INTRAMUSCULAR | Status: AC
  Filled 2020-11-08: qty 30

## 2020-11-08 MED ORDER — LACTATED RINGERS IV SOLN: INTRAVENOUS | Status: DC

## 2020-11-08 MED ORDER — CEFAZOLIN SODIUM-DEXTROSE 2-4 GM/100ML-% IV SOLN
INTRAVENOUS | Status: AC
  Filled 2020-11-08: qty 100

## 2020-11-08 MED ORDER — DEXAMETHASONE SODIUM PHOSPHATE 10 MG/ML IJ SOLN
INTRAMUSCULAR | Status: AC
  Filled 2020-11-08: qty 1

## 2020-11-08 MED ORDER — SODIUM CHLORIDE 0.9 % IV SOLN: 250.0000 mL | INTRAVENOUS | Status: DC | PRN

## 2020-11-08 MED ORDER — PROMETHAZINE HCL 25 MG/ML IJ SOLN
6.2500 mg | INTRAMUSCULAR | Status: DC | PRN
  Administered 2020-11-08: 12.5 mg via INTRAVENOUS

## 2020-11-08 MED ORDER — IPRATROPIUM-ALBUTEROL 0.5-2.5 (3) MG/3ML IN SOLN: 3.0000 mL | Freq: Four times a day (QID) | RESPIRATORY_TRACT | Status: DC | PRN

## 2020-11-08 MED ORDER — SODIUM CHLORIDE 0.9 % IV SOLN
INTRAVENOUS | Status: DC | PRN
  Administered 2020-11-08: 500 mL

## 2020-11-08 MED ORDER — SODIUM CHLORIDE 0.9 % IV SOLN
0.0125 ug/kg/min | INTRAVENOUS | Status: AC
  Administered 2020-11-08: .3 ug/kg/min via INTRAVENOUS
  Administered 2020-11-08: .2 ug/kg/min via INTRAVENOUS
  Filled 2020-11-08: qty 1000

## 2020-11-08 MED ORDER — IVABRADINE HCL 7.5 MG PO TABS
7.5000 mg | ORAL_TABLET | Freq: Two times a day (BID) | ORAL | Status: DC
  Administered 2020-11-08 – 2020-11-10 (×4): 7.5 mg via ORAL
  Filled 2020-11-08 (×5): qty 1

## 2020-11-08 SURGICAL SUPPLY — 40 items
BAG DECANTER FOR FLEXI CONT (MISCELLANEOUS) ×1 IMPLANT
CANISTER SUCT 3000ML PPV (MISCELLANEOUS) ×2 IMPLANT
CLIP VESOCCLUDE MED 6/CT (CLIP) IMPLANT
CLIP VESOCCLUDE SM WIDE 6/CT (CLIP) IMPLANT
COVER PROBE W GEL 5X96 (DRAPES) ×1 IMPLANT
DERMABOND ADVANCED (GAUZE/BANDAGES/DRESSINGS) ×2
DERMABOND ADVANCED .7 DNX12 (GAUZE/BANDAGES/DRESSINGS) ×1 IMPLANT
ELECT REM PT RETURN 9FT ADLT (ELECTROSURGICAL) ×2
ELECTRODE REM PT RTRN 9FT ADLT (ELECTROSURGICAL) ×1 IMPLANT
GENERATOR IPG BAROSTIM 2102 (Generator) ×1 IMPLANT
GLOVE BIOGEL PI IND STRL 7.5 (GLOVE) ×1 IMPLANT
GLOVE BIOGEL PI INDICATOR 7.5 (GLOVE) ×1
GLOVE SURG POLYISO LF SZ7.5 (GLOVE) ×2 IMPLANT
GLOVE SURG UNDER POLY LF SZ6.5 (GLOVE) ×2 IMPLANT
GLOVE SURG UNDER POLY LF SZ7 (GLOVE) ×1 IMPLANT
GOWN STRL REUS W/ TWL LRG LVL3 (GOWN DISPOSABLE) ×3 IMPLANT
GOWN STRL REUS W/ TWL XL LVL3 (GOWN DISPOSABLE) ×1 IMPLANT
GOWN STRL REUS W/TWL LRG LVL3 (GOWN DISPOSABLE) ×6
GOWN STRL REUS W/TWL XL LVL3 (GOWN DISPOSABLE) ×4
HEMOSTAT SNOW SURGICEL 2X4 (HEMOSTASIS) IMPLANT
KIT BASIN OR (CUSTOM PROCEDURE TRAY) ×2 IMPLANT
KIT TURNOVER KIT B (KITS) ×2 IMPLANT
LEAD CAROTID BAROSTIM 1036 (Lead) ×1 IMPLANT
MARKER SKIN DUAL TIP RULER LAB (MISCELLANEOUS) ×2 IMPLANT
NEEDLE 18GX1X1/2 (RX/OR ONLY) (NEEDLE) ×2 IMPLANT
NS IRRIG 1000ML POUR BTL (IV SOLUTION) ×3 IMPLANT
PACK CAROTID (CUSTOM PROCEDURE TRAY) ×2 IMPLANT
PAD ARMBOARD 7.5X6 YLW CONV (MISCELLANEOUS) ×4 IMPLANT
POSITIONER HEAD DONUT 9IN (MISCELLANEOUS) ×2 IMPLANT
SUT ETHIBOND CT1 BRD #0 30IN (SUTURE) ×5 IMPLANT
SUT ETHILON 3 0 PS 1 (SUTURE) IMPLANT
SUT PROLENE 6 0 BV (SUTURE) ×19 IMPLANT
SUT SILK 0 FSL (SUTURE) IMPLANT
SUT VIC AB 3-0 SH 27 (SUTURE) ×4
SUT VIC AB 3-0 SH 27X BRD (SUTURE) ×2 IMPLANT
SUT VICRYL 4-0 PS2 18IN ABS (SUTURE) ×4 IMPLANT
SYR 5ML LL (SYRINGE) ×2 IMPLANT
SYR BULB IRRIG 60ML STRL (SYRINGE) ×2 IMPLANT
TOWEL GREEN STERILE (TOWEL DISPOSABLE) ×2 IMPLANT
WATER STERILE IRR 1000ML POUR (IV SOLUTION) ×2 IMPLANT

## 2020-11-08 NOTE — Transfer of Care (Signed)
Immediate Anesthesia Transfer of Care Note  Patient: [name]  Procedure(s) Performed: INSERTION OF Acquanetta Belling  Patient Location: PACU  Anesthesia Type:General  Level of Consciousness: awake, alert  and oriented  Airway & Oxygen Therapy: Patient Spontanous Breathing and Patient connected to nasal cannula oxygen  Post-op Assessment: Report given to RN and Post -op Vital signs reviewed and stable  Post vital signs: Reviewed and stable  Last Vitals:  Vitals Value Taken Time  BP    Temp    Pulse    Resp    SpO2      Last Pain:  Vitals:   11/08/20 0605  TempSrc:   PainSc: 4       Patients Stated Pain Goal: 2 (09/47/09 6283)  Complications: No notable events documented.

## 2020-11-08 NOTE — H&P (Signed)
[facility] [place]   Patient name: [name]          MRN: [id number]        DOB: [birth year]          Sex: female     REQUESTING PROVIDER:     Dr. Rayann Heman     REASON FOR CONSULT:    Barostim evaluation   HISTORY OF PRESENT ILLNESS:    [name] is a 45 y.o. female, who is being evaluated for a Barostim device.  She has chronic systolic heart failure she has NYHA III symptoms.  Her most recent echo on 08/26/2020 showed an ejection fraction of 20-25%.  She has a single-chamber ICD.  She is on amiodarone for frequent PVCs.  She continues to have dyspnea and mild to moderate exertional fatigue with weakness.  She was recently hospitalized for heart failure and April 2022   The patient is a diabetic.  She is medically managed for hypertension.  She is a non-smoker.   PAST MEDICAL HISTORY          Past Medical History:  Diagnosis Date   Acute on chronic systolic (congestive) heart failure ([place]) 04/29/2017   Acute pain of right shoulder 06/16/2017   AICD (automatic cardioverter/defibrillator) present     Anxiety     Arthritis      right shoulder   Asthma     CHF (congestive heart failure) (HCC)     Chronic combined systolic and diastolic heart failure ([place]) 03/05/2014   Closed low lateral malleolus fracture 10/23/2013   Cystitis 10/21/2017   Depression     Depression with anxiety 01/20/2013   Diabetes mellitus without complication (HCC)     Diverticulosis     Dyspnea      comes and goes intermittently mostly with exertion   Essential hypertension      Prev followed by [name]   Fibroid      age 22   Gallstones     Generalized abdominal cramping     History of cardiomyopathy     Hypertension     IBS (irritable bowel syndrome)     ICD (implantable cardioverter-defibrillator), single, 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 CPAP intolerance >> "smothering" Good tolerance of auto device (optimal pressure 12-13 on download).  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   PVC (premature ventricular contraction) 06/23/2016   Seizures ([place])      "as a child" (08/03/2016)   Termination of pregnancy      due to cardiac risk        FAMILY HISTORY         Family History  Problem Relation Age of Onset   Emphysema Maternal Grandmother  smoked   Heart disease Maternal Grandmother 58        MI   Rheum arthritis Mother     Allergies Daughter     Colon cancer Neg Hx        SOCIAL HISTORY:    Social History         Socioeconomic History   Marital status: Married      Spouse name: Not on file   Number of children: 1   Years of education: Not on file   Highest education level: Not on file  Occupational History   Occupation: stay at home mom  Tobacco Use   Smoking status: Never Smoker   Smokeless tobacco: Never Used  Vaping Use   Vaping Use: Never used  Substance and Sexual Activity   Alcohol use: No   Drug use: No   Sexual activity: Yes  Other Topics Concern   Not on file  Social History Narrative   Not on file    Social Determinants of Health       Financial Resource Strain: Not on file  Food Insecurity: Not on file  Transportation Needs: Not on file  Physical Activity: Insufficiently Active   Days of Exercise per Week: 2 days   Minutes of Exercise per Session: 40 min  Stress:  Not on file  Social Connections: Socially Isolated   Frequency of Communication with Friends and Family: Twice a week   Frequency of Social Gatherings with Friends and Family: Never   Attends Religious Services: Never   Marine scientist or Organizations: No   Attends Music therapist: Never   Marital Status: Married  Human resources officer Violence: Not on file      ALLERGIES:           Allergies  Allergen Reactions   Vancomycin Other (See Comments)      "did something to my kidneys," PROGRESSED TO KIDNEY FAILURE!!   Aspirin Other (See Comments)      Wheezing, (Pt states that she just wheezes some when she takes aspirin by itself but she can take aspirin in a combination product).   Contrast Media [Iodinated Diagnostic Agents] Other (See Comments)      Multiple CT contrast studies done over 2 weeks caused ARF   Avocado Itching and Swelling   Cyclobenzaprine Other (See Comments)   Oxycodone Other (See Comments)   Ciprofloxacin Itching and Rash   Farxiga [Dapagliflozin] Rash   Sulfa Antibiotics Itching and Rash      CURRENT MEDICATIONS:            Current Outpatient Medications  Medication Sig Dispense Refill   albuterol (PROAIR HFA) 108 (90 Base) MCG/ACT inhaler Inhale 1-2 puffs into the lungs every 6 (six) hours as needed for wheezing or shortness of breath. 18 g 5   amiodarone (PACERONE) 200 MG tablet Take 1 tablet (200 mg total) by mouth 2 (two) times daily. 60 tablet 2   budesonide-formoterol (SYMBICORT) 160-4.5 MCG/ACT inhaler Inhale 2 puffs into the lungs 2 (two) times daily as needed. 30.6 each 3   cyanocobalamin (,VITAMIN B-12,) 1000 MCG/ML injection Inject 1 mL (1,000 mcg total) into the muscle as directed. Once daily for 7 days, then once weekly for 4 weeks followed by once monthly 11 mL 1   diclofenac (VOLTAREN) 75 MG EC tablet Take 1 tablet (75 mg total) by mouth 2 (two) times daily as needed. 30 tablet 0   diclofenac Sodium (VOLTAREN) 1 % GEL Apply 4  g topically 4 (four) times daily. 300 g 6   dicyclomine (BENTYL) 20 MG tablet Take 1 tablet (20 mg total) by mouth daily as needed for spasms. 90 tablet 3   empagliflozin (JARDIANCE) 10 MG TABS tablet Take 1 tablet (10 mg total) by mouth daily. 30 tablet 3   fluticasone (FLONASE) 50 MCG/ACT nasal spray Place 2 sprays into both nostrils daily as needed for allergies. 48 g 0   gabapentin (NEURONTIN) 300 MG capsule Take 1 capsule (300 mg total) by mouth at bedtime as needed (back spasms). 90 capsule 3   ipratropium-albuterol (DUONEB) 0.5-2.5 (3) MG/3ML SOLN Take 3 mLs by nebulization every 6 (six) hours as needed. 360 mL 0   ivabradine (CORLANOR) 7.5 MG TABS tablet Take 1 tablet (7.5 mg total) by mouth 2 (two) times daily with a meal. 60 tablet 2   lidocaine (LIDODERM) 5 % Place 1 patch onto the skin daily. Remove & Discard patch within 12 hours or as directed by MD 90 patch 3   Magnesium Oxide 200 MG TABS Take 1 tablet (200 mg total) by mouth daily. 90 tablet 3   metoprolol succinate (TOPROL-XL) 50 MG 24 hr tablet Take 1 tablet (50 mg total) by mouth daily. Take with or immediately following a meal. 30 tablet 3   montelukast (SINGULAIR) 10 MG tablet Take 1 tablet (10 mg total) by mouth at bedtime. 30 tablet 3   Multiple Vitamins-Minerals (MULTIVITAMIN GUMMIES WOMENS PO) Take 2 tablets by mouth at bedtime.       nitroGLYCERIN (NITROSTAT) 0.4 MG SL tablet Place 1 tablet (0.4 mg total) under the tongue every 5 (five) minutes as needed for chest pain. 75 tablet 0   omeprazole (PRILOSEC) 20 MG capsule Take 1 capsule (20 mg total) by mouth daily as needed (for reflux symptoms). 30 capsule 0   sacubitril-valsartan (ENTRESTO) 97-103 MG Take 1 tablet by mouth 2 (two) times daily. HOLD until follow-up with cardiology (Patient not taking: Reported on 10/03/2020) 60 tablet 2   spironolactone (ALDACTONE) 25 MG tablet Take 0.5 tablets (12.5 mg total) by mouth at bedtime. 90 tablet 3   tiZANidine (ZANAFLEX) 4 MG  tablet Take 4 mg by mouth every 8 (eight) hours as needed for muscle spasms.       torsemide (DEMADEX) 20 MG tablet Take 1 tablet (20 mg total) by mouth daily. (Patient not taking: Reported on 10/03/2020) 30 tablet 3   Vitamin D, Ergocalciferol, (DRISDOL) 1.25 MG (50000 UNIT) CAPS capsule Take 1 capsule (50,000 Units total) by mouth every 7 (seven) days. 12 capsule 0    No current facility-administered medications for this visit.      REVIEW OF SYSTEMS:    [X]  denotes positive finding, [ ]  denotes negative finding Cardiac   Comments:  Chest pain or chest pressure:      Shortness of breath upon exertion: x    Short of breath when lying flat:      Irregular heart rhythm: x           Vascular      Pain in calf, thigh, or hip brought on by ambulation:      Pain in feet at night that wakes you up from your sleep:      Blood clot in your veins:      Leg swelling:             Pulmonary      Oxygen at home:      Productive cough:  Wheezing:             Neurologic      Sudden weakness in arms or legs:      Sudden numbness in arms or legs:      Sudden onset of difficulty speaking or slurred speech:      Temporary loss of vision in one eye:      Problems with dizziness:             Gastrointestinal      Blood in stool:        Vomited blood:             Genitourinary      Burning when urinating:      Blood in urine:             Psychiatric      Major depression:             Hematologic      Bleeding problems:      Problems with blood clotting too easily:             Skin      Rashes or ulcers:             Constitutional      Fever or chills:        PHYSICAL EXAM:    There were no vitals filed for this visit.   GENERAL: The patient is a well-nourished female, in no acute distress. The vital signs are documented above. CARDIAC: There is a regular rate and rhythm. VASCULAR: I evaluated her carotid artery with ultrasound.  Her bifurcation is too high for  BATWIRE PULMONARY: Nonlabored respirations ABDOMEN: Soft and non-tender with normal pitched bowel sounds. MUSCULOSKELETAL: There are no major deformities or cyanosis. NEUROLOGIC: No focal weakness or paresthesias are detected. SKIN: There are no ulcers or rashes noted. PSYCHIATRIC: The patient has a normal affect.   STUDIES:    I have reviewed the following studies   Echo:  1. Left ventricular ejection fraction, by estimation, is 30 to 35%. The  left ventricle has moderately decreased function. The left ventricle  demonstrates global hypokinesis. The left ventricular internal cavity size  was mildly to moderately dilated.  Left ventricular diastolic parameters are consistent with Grade III  diastolic dysfunction (restrictive). Elevated left ventricular  end-diastolic pressure.   2. Right ventricular systolic function is normal. The right ventricular  size is normal. There is mildly elevated pulmonary artery systolic  pressure. The estimated right ventricular systolic pressure is 98.9 mmHg.   3. Left atrial size was mildly dilated.   4. The mitral valve is normal in structure. Mild mitral valve  regurgitation. No evidence of mitral stenosis.   5. The aortic valve is normal in structure. Aortic valve regurgitation is  not visualized. No aortic stenosis is present.   6. Aortic dilatation noted. There is borderline dilatation of the aortic  root, measuring 37 mm.   7. The inferior vena cava is normal in size with greater than 50%  respiratory variability, suggesting right atrial pressure of 3 mmHg.   Carotid duplex:   Right Carotid: The extracranial vessels were near-normal with only minimal  wall                 thickening or plaque.   Left Carotid: The extracranial vessels were near-normal with only minimal  wall                thickening or plaque.  Vertebrals:  Bilateral vertebral arteries demonstrate antegrade flow.  Subclavians: Normal flow hemodynamics were seen in  bilateral subclavian               arteries.   ASSESSMENT and PLAN    NYHA a class III heart failure: I discussed that the patient is not a candidate for BATWIRE.  However, she would be an excellent candidate for the commercial Barostim NEO device.  I discussed the details of the procedure as well as the risks and benefits.  She would like to proceed.  We will begin insurance authorization and get her scheduled once she is cleared.   Leia Alf, MD, FACS [facility] [phone] Pager [phone]  HPI:  No significant interval changes PE:  CV:RRR PULM:CTA NEURO:  intact Plan:  discussed details of procedure.  All questions answered.  Annamarie Major

## 2020-11-08 NOTE — Op Note (Signed)
    Patient name: [name] MRN: [id number] DOB: 12/17/1975 Sex: female  11/08/2020 Pre-operative Diagnosis: CHF Post-operative diagnosis:  Same Surgeon:  Annamarie Major Assistants:  C.Baglia, PA Procedure:   Barostim Neo implant Anesthesia:  General Blood Loss:  Minimal Specimens:  None  Findings:  Device in position "A" Impedance:  564 Voltage; 2.96 Interaction testing:  none  SN [id number]  Model 1036 SN [id number]  Model:  2102  Indications:  45 year old female with NYHA class II heart failure who has been approved for Barostim devic implant  Procedure:  The patient was identified in the holding area and taken to [place] [address]  The patient was then placed supine on the table. general anesthesia was administered.  The patient was prepped and draped in the usual sterile fashion.  A time out was called and antibiotics were administered.  A PA was necessary to expedite the procedure and assist with technical details.  U/S was used to find and mark the carotid bifurcation.  A 3 cm transverse incision was made over the bifurcation on the right neck.  Dissection was carried down to the carotid artery.  The bifurcation was exposed(minimally)  The hypoglossal nerve was visualized and not manipulated.  Next the infraclavicular pocket was created, followed by tunnelling between the two incisions.  The device was connected and the generator was placed in the pocket.  The electrode was put on position "A".  Testing showed a quick response with rebound.  The electrode was sutured in place with a 6-0 prolene at 7 and 5 o'clock.  Testing was repeated and we had a similar response.  The buckle was cut and removed.  4 additional 6-0 prolene sutures were placed at 2,12:30, 11, and 8.  The strain relief loop was secured to the carotid adventitia with 2 6-0 prolene sutures.  The wounds were irrigated with antibiotic solution.  The generator was secured in place with Ethibond suture.  Both incisions were  closed with a deep layer of 3-0 followed by a 4-0 vicryl and dermabond   Disposition:  To PACU stable   V. Annamarie Major, M.D., [facility] of [place] Office: [phone] Pager:  [phone]

## 2020-11-08 NOTE — Progress Notes (Signed)
Patient brought to 4E from PACU. Telemetry box applied, CCMD notified. VSS. CHG bath completed. Patient states 7/10 on pain scale. Pain medication given in PACU before patient arrived. Patient oriented to room and staff. Call bell in reach.  Daymon Larsen, RN

## 2020-11-08 NOTE — Progress Notes (Signed)
Dr. Lissa Hoard made aware of patient's BP readings 177/96 and 175/101. No new orders received at this time.

## 2020-11-08 NOTE — Anesthesia Procedure Notes (Signed)
Arterial Line Insertion Start/End6/17/2022 7:10 AM, 11/08/2020 7:15 AM Performed by: Inda Coke, CRNA, CRNA  Patient location: Pre-op. Preanesthetic checklist: patient identified, IV checked, site marked, risks and benefits discussed, surgical consent, monitors and equipment checked, pre-op evaluation, timeout performed and anesthesia consent Lidocaine 1% used for infiltration and patient sedated Right, radial was placed Catheter size: 20 G Hand hygiene performed  and maximum sterile barriers used  Allen's test indicative of satisfactory collateral circulation Attempts: 1 Procedure performed without using ultrasound guided technique. Following insertion, dressing applied and Biopatch. Post procedure assessment: normal  Patient tolerated the procedure well with no immediate complications.

## 2020-11-08 NOTE — Anesthesia Procedure Notes (Signed)
Procedure Name: Intubation Date/Time: 11/08/2020 7:50 AM Performed by: Inda Coke, CRNA Pre-anesthesia Checklist: Patient identified, Emergency Drugs available, Suction available and Patient being monitored Patient Re-evaluated:Patient Re-evaluated prior to induction Oxygen Delivery Method: Circle System Utilized Preoxygenation: Pre-oxygenation with 100% oxygen Induction Type: IV induction Ventilation: Mask ventilation without difficulty and Oral airway inserted - appropriate to patient size Laryngoscope Size: Mac and 3 Grade View: Grade I Tube type: Oral Number of attempts: 1 Airway Equipment and Method: Stylet and Oral airway Placement Confirmation: ETT inserted through vocal cords under direct vision, positive ETCO2 and breath sounds checked- equal and bilateral Secured at: 22 cm Tube secured with: Tape Dental Injury: Teeth and Oropharynx as per pre-operative assessment

## 2020-11-09 DIAGNOSIS — I110 Hypertensive heart disease with heart failure: Secondary | ICD-10-CM | POA: Diagnosis not present

## 2020-11-09 DIAGNOSIS — I509 Heart failure, unspecified: Secondary | ICD-10-CM

## 2020-11-09 MED ORDER — BUDESONIDE-FORMOTEROL FUMARATE 160-4.5 MCG/ACT IN AERO
2.0000 | INHALATION_SPRAY | Freq: Two times a day (BID) | RESPIRATORY_TRACT | Status: DC
  Administered 2020-11-09: 2 via RESPIRATORY_TRACT

## 2020-11-09 NOTE — Progress Notes (Signed)
    Subjective  - POD #1, s/p Barostim device implant  Complaining of soreness in her incisions as well as a sore throat   Physical Exam:  Incisions are healing nicely.  There is no significant hematoma.  She is neurologically intact       Assessment/Plan:  POD #1  Overall this is doing very well.  She is stable for discharge home today.  She will follow-up with me in the office in 2 weeks  [name] 11/09/2020 10:04 AM --  Vitals:   11/09/20 0344 11/09/20 0737  BP: 126/70 139/81  Pulse: 68 64  Resp: 17 14  Temp: 97.7 F (36.5 C) 97.7 F (36.5 C)  SpO2: 99% 94%    Intake/Output Summary (Last 24 hours) at 11/09/2020 1004 Last data filed at 11/08/2020 2054 Gross per 24 hour  Intake 3 ml  Output --  Net 3 ml     Laboratory CBC    Component Value Date/Time   WBC 5.3 11/06/2020 1010   HGB 13.3 11/06/2020 1010   HGB 10.6 (L) 09/07/2019 1159   HCT 42.0 11/06/2020 1010   HCT 32.6 (L) 08/06/2020 1517   PLT 198 11/06/2020 1010   PLT 278 09/07/2019 1159    BMET    Component Value Date/Time   NA 137 11/06/2020 1010   NA 140 09/06/2020 0938   K 3.3 (L) 11/06/2020 1010   CL 104 11/06/2020 1010   CO2 26 11/06/2020 1010   GLUCOSE 97 11/06/2020 1010   BUN <5 (L) 11/06/2020 1010   BUN 9 09/06/2020 0938   CREATININE 0.78 11/06/2020 1010   CREATININE 0.90 03/26/2016 1536   CALCIUM 9.1 11/06/2020 1010   GFRNONAA >60 11/06/2020 1010   GFRNONAA >89 01/18/2014 1621   GFRAA >60 02/15/2020 1845   GFRAA >89 01/18/2014 1621    COAG Lab Results  Component Value Date   INR 1.0 11/06/2020   INR 1.04 08/05/2017   INR 1.0 07/30/2016   No results found for: PTT  Antibiotics Anti-infectives (From admission, onward)    Start     Dose/Rate Route Frequency Ordered Stop   11/08/20 0849  vancomycin (VANCOCIN) 1,000 mg in sodium chloride 0.9 % 1,000 mL irrigation  Status:  Discontinued          As needed 11/08/20 0852 11/08/20 0934   11/08/20 0616  ceFAZolin  (ANCEF) 2-4 GM/100ML-% IVPB       Note to Pharmacy: Wendall Mola   : cabinet override      11/08/20 0616 11/08/20 1829   11/08/20 0546  ceFAZolin (ANCEF) IVPB 2g/100 mL premix        2 g 200 mL/hr over 30 Minutes Intravenous 30 min pre-op 11/08/20 0546 11/08/20 0831        V. Leia Alf, M.D., [facility] of [place] Office: [phone] Pager:  [phone]

## 2020-11-10 DIAGNOSIS — I110 Hypertensive heart disease with heart failure: Secondary | ICD-10-CM | POA: Diagnosis not present

## 2020-11-10 MED ORDER — OXYCODONE-ACETAMINOPHEN 5-325 MG PO TABS: 1.0000 | ORAL_TABLET | ORAL | 0 refills | Status: DC | PRN

## 2020-11-10 NOTE — Progress Notes (Signed)
    Subjective  - POD #2  Still with some incisional discomfort however this is improved   Physical Exam:  Incisions are without hematoma.  Dressings are dry       Assessment/Plan:  POD #2  Overall she is recovering nicely.  We discussed discharge home today  [place] 11/10/2020 11:27 AM --  Vitals:   11/10/20 0401 11/10/20 0807  BP: 136/73 (!) 143/87  Pulse: 63 65  Resp: 14 12  Temp: 97.8 F (36.6 C) 98.1 F (36.7 C)  SpO2: 99% 99%    Intake/Output Summary (Last 24 hours) at 11/10/2020 1127 Last data filed at 11/09/2020 1756 Gross per 24 hour  Intake 960 ml  Output --  Net 960 ml     Laboratory CBC    Component Value Date/Time   WBC 5.3 11/06/2020 1010   HGB 13.3 11/06/2020 1010   HGB 10.6 (L) 09/07/2019 1159   HCT 42.0 11/06/2020 1010   HCT 32.6 (L) 08/06/2020 1517   PLT 198 11/06/2020 1010   PLT 278 09/07/2019 1159    BMET    Component Value Date/Time   NA 137 11/06/2020 1010   NA 140 09/06/2020 0938   K 3.3 (L) 11/06/2020 1010   CL 104 11/06/2020 1010   CO2 26 11/06/2020 1010   GLUCOSE 97 11/06/2020 1010   BUN <5 (L) 11/06/2020 1010   BUN 9 09/06/2020 0938   CREATININE 0.78 11/06/2020 1010   CREATININE 0.90 03/26/2016 1536   CALCIUM 9.1 11/06/2020 1010   GFRNONAA >60 11/06/2020 1010   GFRNONAA >89 01/18/2014 1621   GFRAA >60 02/15/2020 1845   GFRAA >89 01/18/2014 1621    COAG Lab Results  Component Value Date   INR 1.0 11/06/2020   INR 1.04 08/05/2017   INR 1.0 07/30/2016   No results found for: PTT  Antibiotics Anti-infectives (From admission, onward)    Start     Dose/Rate Route Frequency Ordered Stop   11/08/20 0849  vancomycin (VANCOCIN) 1,000 mg in sodium chloride 0.9 % 1,000 mL irrigation  Status:  Discontinued          As needed 11/08/20 0852 11/08/20 0934   11/08/20 0616  ceFAZolin (ANCEF) 2-4 GM/100ML-% IVPB       Note to Pharmacy: Wendall Mola   : cabinet override      11/08/20 0616 11/08/20 1829    11/08/20 0546  ceFAZolin (ANCEF) IVPB 2g/100 mL premix        2 g 200 mL/hr over 30 Minutes Intravenous 30 min pre-op 11/08/20 0546 11/08/20 0831        V. Leia Alf, M.D., [facility] of [place] Office: [phone] Pager:  [phone]

## 2020-11-11 ENCOUNTER — Other Ambulatory Visit: Payer: Self-pay | Admitting: Physician Assistant

## 2020-11-11 ENCOUNTER — Inpatient Hospital Stay (HOSPITAL_COMMUNITY): Admission: RE | Admit: 2020-11-11 | Payer: HMO | Source: Ambulatory Visit

## 2020-11-11 MED ORDER — OXYCODONE-ACETAMINOPHEN 5-325 MG PO TABS: 1.0000 | ORAL_TABLET | Freq: Four times a day (QID) | ORAL | 0 refills | Status: DC | PRN

## 2020-11-12 NOTE — Anesthesia Postprocedure Evaluation (Signed)
Anesthesia Post Note  Patient: [name]  Procedure(s) Performed: INSERTION OF BAROSTIM     Patient location during evaluation: PACU Anesthesia Type: General Level of consciousness: sedated and patient cooperative Pain management: pain level controlled Vital Signs Assessment: post-procedure vital signs reviewed and stable Respiratory status: spontaneous breathing Cardiovascular status: stable Anesthetic complications: no   No notable events documented.  Last Vitals:  Vitals:   11/10/20 0401 11/10/20 0807  BP: 136/73 (!) 143/87  Pulse: 63 65  Resp: 14 12  Temp: 36.6 C 36.7 C  SpO2: 99% 99%    Last Pain:  Vitals:   11/10/20 0851  TempSrc:   PainSc: [place]

## 2020-11-13 NOTE — Discharge Summary (Signed)
Discharge Summary  Patient ID: [name] [id number] 45 y.o. [birth year]  Admit date: 11/08/2020  Discharge date and time: 11/10/2020 12:22 PM   Admitting Physician: Serafina Mitchell, MD   Discharge Physician: Serafina Mitchell, MD  Admission Diagnoses: Congestive heart failure, NYHA class III ([place]) [I50.9]  Discharge Diagnoses: Congestive heart failure, NYHA class III ([place]) [I50.9]  Admission Condition: good  Discharged Condition: good  Indication for Admission: 45 year old female with NYHA class II heart failure who has been approved for Barostim devic implant  Hospital Course: On the day of admission, the patient was taken to the operating room where she underwent Barostem neo implantation.  She tolerated procedure well.  On postoperative day 1 she was complaining of considerable incisional soreness.  Her vital signs were stable.  Her diet was advanced.  On postoperative day 2, she had no cough or shortness of breath.  Her incisional pain had improved.  No signs of incisional infection.  She was ready for discharge home in satisfactory condition.  Consults: None  Treatments: surgery: Barostim neo implant  Discharge Exam: Vital signs stable.  Afebrile Vitals:   11/10/20 0401 11/10/20 0807  BP: 136/73 (!) 143/87  Pulse: 63 65  Resp: 14 12  Temp: 97.8 F (36.6 C) 98.1 F (36.7 C)  SpO2: 99% 99%   Cardiac: Heart rate and rhythm regular Lungs: Lungs clear to auscultation bilaterally Incisions: Right neck and chest incisions healing without signs of hematoma or infection Extremities: Moving all extremities well.  No weakness. Neurologic: Alert and oriented x4.  Cranial nerves II through XII grossly intact.  No deficits.   Disposition: Discharge disposition: 01-Home or Self Care       Patient Instructions:  Allergies as of 11/10/2020       Reactions   Vancomycin Other (See Comments)   "did something to my kidneys," PROGRESSED TO KIDNEY FAILURE!!   Aspirin Other  (See Comments)   Wheezing, (Pt states that she just wheezes some when she takes aspirin by itself but she can take aspirin in a combination product).   Contrast Media [iodinated Diagnostic Agents] Other (See Comments)   Multiple CT contrast studies done over 2 weeks caused ARF   Avocado Itching, Swelling   Cyclobenzaprine Other (See Comments)   Can not tolerate   Oxycodone Other (See Comments)   unknown   Ciprofloxacin Itching, Rash   Farxiga [dapagliflozin] Rash   Sulfa Antibiotics Itching, Rash        Medication List     TAKE these medications    amiodarone 200 MG tablet Commonly known as: PACERONE Take 1 tablet (200 mg total) by mouth 2 (two) times daily.   dicyclomine 20 MG tablet Commonly known as: BENTYL Take 1 tablet (20 mg total) by mouth daily as needed for spasms.   empagliflozin 10 MG Tabs tablet Commonly known as: Jardiance Take 1 tablet (10 mg total) by mouth daily.   fluticasone 50 MCG/ACT nasal spray Commonly known as: FLONASE Place 2 sprays into both nostrils daily as needed for allergies.   gabapentin 300 MG capsule Commonly known as: NEURONTIN Take 1 capsule (300 mg total) by mouth at bedtime as needed (back spasms).   ipratropium-albuterol 0.5-2.5 (3) MG/3ML Soln Commonly known as: DUONEB Take 3 mLs by nebulization every 6 (six) hours as needed. What changed: reasons to take this   ivabradine 7.5 MG Tabs tablet Commonly known as: Corlanor Take 1 tablet (7.5 mg total) by mouth 2 (two) times daily with  a meal.   Magnesium Oxide 200 MG Tabs Take 1 tablet (200 mg total) by mouth daily.   metoprolol succinate 50 MG 24 hr tablet Commonly known as: TOPROL-XL Take 1 tablet (50 mg total) by mouth daily. Take with or immediately following a meal.   MULTIVITAMIN GUMMIES WOMENS PO Take 2 tablets by mouth at bedtime.   nitroGLYCERIN 0.4 MG SL tablet Commonly known as: NITROSTAT Place 1 tablet (0.4 mg total) under the tongue every 5 (five) minutes as  needed for chest pain.   omeprazole 20 MG capsule Commonly known as: PRILOSEC Take 1 capsule (20 mg total) by mouth daily as needed (for reflux symptoms).   sacubitril-valsartan 97-103 MG Commonly known as: ENTRESTO Take 1 tablet by mouth 2 (two) times daily. HOLD until follow-up with cardiology   spironolactone 25 MG tablet Commonly known as: ALDACTONE Take 0.5 tablets (12.5 mg total) by mouth at bedtime.   tiZANidine 4 MG tablet Commonly known as: ZANAFLEX Take 4 mg by mouth every 8 (eight) hours as needed for muscle spasms.   torsemide 20 MG tablet Commonly known as: DEMADEX Take 1 tablet (20 mg total) by mouth daily.       ASK your doctor about these medications    albuterol 108 (90 Base) MCG/ACT inhaler Commonly known as: ProAir HFA Inhale 1-2 puffs into the lungs every 6 (six) hours as needed for wheezing or shortness of breath.   budesonide-formoterol 160-4.5 MCG/ACT inhaler Commonly known as: Symbicort Inhale 2 puffs into the lungs 2 (two) times daily as needed.   cyanocobalamin 1000 MCG/ML injection Commonly known as: (VITAMIN B-12) Inject 1 mL (1,000 mcg total) into the muscle as directed. Once daily for 7 days, then once weekly for 4 weeks followed by once monthly   diclofenac 75 MG EC tablet Commonly known as: VOLTAREN Take 1 tablet (75 mg total) by mouth 2 (two) times daily as needed.   diclofenac Sodium 1 % Gel Commonly known as: VOLTAREN Apply 4 g topically 4 (four) times daily.   lidocaine 5 % Commonly known as: Lidoderm Place 1 patch onto the skin daily. Remove & Discard patch within 12 hours or as directed by MD   montelukast 10 MG tablet Commonly known as: SINGULAIR Take 1 tablet (10 mg total) by mouth at bedtime.   Vitamin D (Ergocalciferol) 1.25 MG (50000 UNIT) Caps capsule Commonly known as: DRISDOL Take 1 capsule (50,000 Units total) by mouth every 7 (seven) days.               Discharge Care Instructions  (From admission,  onward)           Start     Ordered   11/10/20 0000  Discharge wound care:       Comments: Keep incision clean and dry. Wash daily with mild soap and water, pat dry   11/10/20 0929           Activity: activity as tolerated Diet: cardiac diet Wound Care: keep wound clean and dry  Follow-up with Dr. Trula Slade in 2 weeks.  Signed: Barbie Banner, PA-C 11/13/2020 9:12 AM [facility] Office: [phone]

## 2020-11-14 ENCOUNTER — Other Ambulatory Visit: Payer: Self-pay | Admitting: Physician Assistant

## 2020-11-14 DIAGNOSIS — I5022 Chronic systolic (congestive) heart failure: Secondary | ICD-10-CM

## 2020-11-14 MED ORDER — OXYCODONE-ACETAMINOPHEN 5-325 MG PO TABS: 1.0000 | ORAL_TABLET | Freq: Four times a day (QID) | ORAL | 0 refills | Status: DC | PRN

## 2020-11-18 ENCOUNTER — Ambulatory Visit (INDEPENDENT_AMBULATORY_CARE_PROVIDER_SITE_OTHER): Payer: HMO

## 2020-11-18 DIAGNOSIS — Z95810 Presence of automatic (implantable) cardiac defibrillator: Secondary | ICD-10-CM | POA: Diagnosis not present

## 2020-11-18 DIAGNOSIS — I5042 Chronic combined systolic (congestive) and diastolic (congestive) heart failure: Secondary | ICD-10-CM | POA: Diagnosis not present

## 2020-11-20 NOTE — Progress Notes (Signed)
EPIC Encounter for ICM Monitoring  Patient Name: [name] is a 45 y.o. female Date: 11/20/2020 Primary Care Physican: Hoyt Koch, MD Primary Cardiologist: Bensimhon Electrophysiologist: Caryl Comes 09/03/2020 Office Weight: 230 lbs                                                             Transmission reviewed and results sent via mychart.  Scheduled 11/08/2020 for Barostim implant.   Heartlogic HF Index 5 suggesting normal fluid levels.   Prescribed: Torsemide 20 mg Take 1 tablet  (20 mg total) by mouth daily.  Spironolactone 25 mg take 0.5 tablet (12.5 mg total) daily.    Labs: 10/03/2020 Creatinine 0.72, BUN <5, Potassium 4.0, Sodium 140 09/06/2020 Creatinine 0.89, BUN 9,   Potassium 4.3, Sodium 140, GFR 81 09/03/2020 Creatinine 0.86, BUN <5, Potassium 3.7, Sodium 137, GFR >60 08/26/2020 Creatinine 0.89, BUN 11, Potassium 4.7, Sodium 136, GFR >60 08/25/2020 Creatinine 0.69, BUN 8,   Potassium 3.8, Sodium 136, GFR >60 08/06/2020 Creatinine 0.87, BUN 17, Potassium 4.0, Sodium 143, GFR >60 07/29/2020 Creatinine 0.72, BUN 10, Potassium 3.5, Sodium 142  A complete set of results can be found in Results Review.   Recommendations:  No changes.   Follow-up plan: ICM clinic phone appointment on 12/23/2020.   91 day device clinic remote transmission 12/17/2020.              EP/Cardiology next office visit:  11/29/2020 with HF clinic NP/PA.  11/29/2020 with Oda Kilts, [place].  01/14/2021 with Dr Haroldine Laws   Copy of ICM check sent to Dr. Caryl Comes.  3 Month Trend    8 Day Data Trend          [name], RN 11/20/2020 4:53 PM

## 2020-11-28 NOTE — Progress Notes (Signed)
Advanced Heart Failure Clinic Note   Referring Physician: PCP: Hoyt Koch, MD PCP-Cardiologist: Sinclair Grooms, MD  [facility]: Dr. Haroldine Laws   Reason for Visit: F/u for Chronic Systolic Heart Failure  HPI:  [name] is a 45 y.o. 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    Was admitted 12/5-11/2016 with recurrent HF, URI, CP and elevated BP (160/110). CT chest no PE. Diuresed from 233-> 228 pounds.    Admitted 11/2017 with CP described as "pounding". Troponins were negative. Symptoms thought to be related to frequent PVCs. EP consulted and scheduled her for PVC ablation. Echo repeated 11/22/17 and showed improved EF 50-55%. HF team consulted and changed torsemide to 40 mg daily (from 3x/week).    Underwent PVC ablation 12/01/17. Was felt not to be successful.  Was on flecainide but discontinued due to reduced EF. Now on amiodarone.    Repeat Echo 10/2018 showed drop in EF back down to 25-30%. Was instructed to f/u in clinic but pt did not. She had outpatient monitor 04/2018 that showed rare PVCs.    Evaluated in the ED 04/11/20 w/ CC of chest pain. According to EDP documentation, pain had been present for 3-6 hrs prior to presenting to the ED. Described as central-left sided heaviness/pressure/tightness that was worse with exertion. No pleuritic pain or hypoxia to suggest acute PE. Hs Troponin normal x 2 (4>>5). EKG showed sinus tach 104 w/ frequent PVCs. Device interrogation was unremarkable. She was released from the ED and instructed to f/u in clinic.     Saw EP on 12/11 with suggestion for bb again.    Admitted 10/2019 with chest pain and shortness of breath. Viral panel negative. ECHO completed and showed  EF < 20% and normal RV. Had cath with preserved cardiac output. Plan was to f/u with EP regarding PVCs. She has remained on AAD therapy w/ amiodarone + ? blocker therapy.  Admitted to [facility] 4/22 w/ increased dyspnea and volume overload. Also w/ increased PVC burden. Echo showed EF 20-25%, RV normal. She was diuresed w/ IV Lasix and placed on amiodarone gtt for PVC suppression. Unfortunately was over diuresed, and was given back IVFs. BP however remained soft, and Entresto was discontinued. Torsemide was also discontinued. She was placed on Jardiance 10 mg at discharge. ? blocker was changed from Coreg to Toprol XL 50 mg daily given low BP. EP was also consulted and recommended increasing PO amio to 200 mg bid at d/c w/ close EP clinic f/u.   She had post hospital f/u on 4/12 and was improving but still fluid overloaded,  Device interrogation showed HL score of 18 (goal 6). We restarted torsemide and spiro. She had f/u remote device check on 5/2 and HL index was 0, suggesting normal fluid levels.    Since her last visit, she has also been seen again by EP. Per Dr. Lovena Le, due to the location of her PVCs, she is not a candidate for re-do ablation. Could consider referral to tertiary center, but success rate is not any more likely. They did change her ? blocker from Coreg, to metoprolol to see if more cardioselective ? blocker would be of more benefit. Also given her persistent HF symptoms, despite med optimization, she is  felt to be a candidate for BaroStim.   She presented to clinic 5/22 for f/u. Wt is down 2 lb. HL score 0.  BP ok 136/82. Still complains of daytime fatigue. Remains NYHA Class II. No palpitations.   S/p Barostim 6/22.  Today she returns for HF follow up. Overall feeling fine, still fatigued, unable to exercise. Denies increasing SOB, CP, dizziness, edema, or PND/Orthopnea. Appetite ok. No fever or chills. Weight at home stable. Taking all medications. Has not restarted Entresto. Does not  check BP at home, has been taking torsemide 20 mg daily.    [employer] Score: 20    Cardiac Studies  - Echo (12/19): EF back down to 20-25% - Bedside echo by Dr. Haroldine Laws with EF improved to 50%. - Echo (6/20): EF back down at 25-30%.   - Cath (6/17): revealed normal vessels, moderate elevation in pulmonary artery pressures, and LVEF 25-30%.   - RHC (6/21):   RA = 4 RV = 32/6 PA = 31/8 (21) PCW = 12 Fick cardiac output/index = 7.0/3.4 Thermo CO/CI = 5.2/2.5 PVR =1.7 (Thermo) Ao sat = 98% PA sat = 67%, 69%   - RHC (3/19): RA = 2 RV = 26/7 PA = 28/13 (19) PCW = 5 Fick cardiac output/index = 5.7/2.8 PVR = 2.5 WU Ao sat = 99% PA sat = 71%, 72% High SVC sat = 65%   - RHC (6/17): RA 5 RV 35/6 PA 41/12 PW 11 Fick 6.4/3.0 PA sat 62%  - Echo 6/15 35-40% - Echo 6/17 25-30% - Echo 12/17 20-25% - Echo 8/18 EF 25% - Echo 7/19: EF 50-55% - Echo 6/20: EF 25-30% - Echo 11/13/19: Ef < 20% RV normal  - Echo 4/22: EF 20-25%, RV normal    - CPX (2/18): FVC 2.57 (79%)      FEV1 2.14 (81%)        FEV1/FVC 83 (101%)        MVV 107 (102%) Resting HR: 106 Peak HR: 166   (93% age predicted max HR) BP rest: 136/98 BP peak: 174/88 Peak VO2: 17.1 (85% predicted peak VO2) - corrects to 28.4 for ibw VE/VCO2 slope:  33 OUES: 2.16 Peak RER: 1.09 Ventilatory Threshold: 14.6 (73% predicted or measured peak VO2) VE/MVV:  59% PETCO2 at peak:  33 O2pulse:  10   (83% predicted O2pulse)   - CPX (12/18): pVO2 14.0 (corrects to 23.0 for IBW) VeVCO2  32 RER 1.0  - PFTs w/ DLOC (4/22) Mild Restriction Normal Diffusion   Review of systems complete and found to be negative unless listed in HPI.    Past Medical History:  Diagnosis Date   Acute on chronic systolic (congestive) heart failure ([place]) 04/29/2017   Acute pain of right shoulder 06/16/2017   AICD (automatic cardioverter/defibrillator) present    Anginal pain (HCC)    Anxiety    Arthritis    right shoulder     Asthma    CHF (congestive heart failure) ([place])    Chronic combined systolic and diastolic heart failure ([place]) 03/05/2014   Closed low lateral malleolus fracture 10/23/2013   Cystitis 10/21/2017   Depression    Depression with anxiety 01/20/2013   Diabetes mellitus without complication (HCC)    Diverticulosis    Dyspnea    comes and goes intermittently mostly with exertion    Dysrhythmia    Essential hypertension    Prev followed by [name]    Fibroid    age 54   Gallstones  Generalized abdominal cramping    History of cardiomyopathy    Hypertension    IBS (irritable bowel syndrome)    ICD (implantable cardioverter-defibrillator), single, 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 CPAP intolerance >> "smothering" Good tolerance of auto device (optimal pressure 12-13 on download).  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 18 g 5   amiodarone (PACERONE) 200 MG tablet Take 1 tablet (200 mg  total) by mouth 2 (two) times daily. 60 tablet 2   budesonide-formoterol (SYMBICORT) 160-4.5 MCG/ACT inhaler Inhale 2 puffs into the lungs 2 (two) times daily as needed. 30.6 each 3   cyanocobalamin (,VITAMIN B-12,) 1000 MCG/ML injection Inject 1 mL (1,000 mcg total) into the muscle as directed. Once daily for 7 days, then once weekly for 4 weeks followed by once monthly (Patient taking differently: Inject 1,000 mcg into the muscle every 30 (thirty) days.) 11 mL 1   diclofenac (VOLTAREN) 75 MG EC tablet Take 1 tablet (75 mg total) by mouth 2 (two) times daily as needed. 30 tablet 0   diclofenac Sodium (VOLTAREN) 1 % GEL Apply 4 g topically 4 (four) times daily. 300 g 6   dicyclomine (BENTYL) 20 MG tablet Take 1 tablet (20 mg total) by mouth daily as needed for spasms. 90 tablet 3   empagliflozin (JARDIANCE) 10 MG TABS tablet Take 1 tablet (10 mg total) by mouth daily. 30 tablet 3   fluticasone (FLONASE) 50 MCG/ACT nasal spray Place 2 sprays into both nostrils daily as needed for allergies. 48 g 0   gabapentin (NEURONTIN) 300 MG capsule Take 1 capsule (300 mg total) by mouth at bedtime as needed (back spasms). 90 capsule 3   ipratropium-albuterol (DUONEB) 0.5-2.5 (3) MG/3ML SOLN Take 3 mLs by nebulization every 6 (six) hours as needed. 360 mL 0   ivabradine (CORLANOR) 7.5 MG TABS tablet Take 1 tablet (7.5 mg total) by mouth 2 (two) times daily with a meal. 60 tablet 2   lidocaine (LIDODERM) 5 % Place 1 patch onto the skin daily. Remove & Discard patch within 12 hours or as directed by MD 90 patch 3   Magnesium Oxide 200 MG TABS Take 1 tablet (200 mg total) by mouth daily. 90 tablet 3   metoprolol succinate (TOPROL-XL) 50 MG 24 hr tablet Take 1 tablet (50 mg total) by mouth daily. Take with or immediately following a meal. 30 tablet 3   Multiple Vitamins-Minerals (MULTIVITAMIN GUMMIES  WOMENS PO) Take 2 tablets by mouth at bedtime.      nitroGLYCERIN (NITROSTAT) 0.4 MG SL tablet Place 1 tablet (0.4 mg  total) under the tongue every 5 (five) minutes as needed for chest pain. 75 tablet 0   omeprazole (PRILOSEC) 20 MG capsule Take 1 capsule (20 mg total) by mouth daily as needed (for reflux symptoms). 30 capsule 0   spironolactone (ALDACTONE) 25 MG tablet Take 0.5 tablets (12.5 mg total) by mouth at bedtime. 90 tablet 3   tiZANidine (ZANAFLEX) 4 MG tablet Take 4 mg by mouth every 8 (eight) hours as needed for muscle spasms.      torsemide (DEMADEX) 20 MG tablet Take 1 tablet (20 mg total) by mouth daily. 30 tablet 3   Vitamin D, Ergocalciferol, (DRISDOL) 1.25 MG (50000 UNIT) CAPS capsule Take 1 capsule (50,000 Units total) by mouth every 7 (seven) days. (Patient taking differently: Take 50,000 Units by mouth every 7 (seven) days. Wednesday) 12 capsule 0   No current facility-administered medications for this encounter.   Allergies  Allergen Reactions   Vancomycin Other (See Comments)    "did something to my kidneys," PROGRESSED TO KIDNEY FAILURE!!   Aspirin Other (See Comments)    Wheezing, (Pt states that she just wheezes some when she takes aspirin by itself but she can take aspirin in a combination product).   Contrast Media [Iodinated Diagnostic Agents] Other (See Comments)    Multiple CT contrast studies done over 2 weeks caused ARF   Cyclobenzaprine Other (See Comments)    Can not tolerate   Ciprofloxacin Itching and Rash   Farxiga [Dapagliflozin] Rash   Sulfa Antibiotics Itching and Rash   Social History   Socioeconomic History   Marital status: Married    Spouse name: Not on file   Number of children: 1   Years of education: Not on file   Highest education level: Not on file  Occupational History   Occupation: stay at home mom  Tobacco Use   Smoking status: Never   Smokeless tobacco: Never  Vaping Use   Vaping Use: Never used  Substance and Sexual Activity   Alcohol use: No   Drug use: No   Sexual activity: Yes  Other Topics Concern   Not on file  Social History  Narrative   Not on file   Social Determinants of Health   Financial Resource Strain: Not on file  Food Insecurity: Not on file  Transportation Needs: Not on file  Physical Activity: Not on file  Stress: Not on file  Social Connections: Not on file  Intimate Partner Violence: Not on file   Family History  Problem Relation Age of Onset   Emphysema Maternal Grandmother        smoked   Heart disease Maternal Grandmother 2       MI   Rheum arthritis Mother    Allergies Daughter    Colon cancer Neg Hx    BP (!) 149/87   Pulse 76   Wt 102 kg (224 lb 12.8 oz)   SpO2 97%   BMI 37.41 kg/m   Wt Readings from Last 3 Encounters:  11/29/20 102 kg (224 lb 12.8 oz)  11/08/20 100.7 kg (222 lb)  11/06/20 101.7 kg (224 lb 3.2 oz)   PHYSICAL EXAM: General:  NAD. No resp difficulty HEENT: Normal Neck: Supple. JVP 6-7. Carotids 2+ bilat; no bruits. No lymphadenopathy or thryomegaly appreciated. Cor: PMI nondisplaced. Regular rate & rhythm. No rubs, gallops or  murmurs. Lungs: Clear Abdomen: Obese, nontender, nondistended. No hepatosplenomegaly. No bruits or masses. Good bowel sounds. Extremities: No cyanosis, clubbing, rash, edema Neuro: Alert & oriented x 3, cranial nerves grossly intact. Moves all 4 extremities w/o difficulty. Affect pleasant.  ASSESSMENT & PLAN:  1. Chronic systolic HF:  due to peri-partum CM, onset 1999. S/P [employer] ICD 07/2016 - EF 20-25% (12/17).  - Echo 8/18 EF 25-30% s/p ICD 3/18. [place] 3/19: RA 2, PA 28/13, Fick CO 5.7, CI 2.8 - CPX 12/18 showed moderate limitation due mostly to obesity with some HF component - Echo 11/22/17 LVEF 50-55%, Grade 1 DD, Mild MR, Mod LAE - Echo 04/2018 by Dr Haroldine Laws EF 20-25%. RV ok  - Echo 10/2019 EF < 20%. - Echo 08/2020 EF 20-25%, RV normal  - NYHA Class II. Volume status elevated. HL Score 20. - Start Entresto 24/26 mg bid. (Has been off Entresto w/ low BP. Now improved.) - Continue torsemide 20 mg daily x 5 days, then  can go to every other day. - Continue Jardiance 10 mg (had recent yeast infection, cleared with fluconazole. Instructed her to notify us of recurrence) - Continue Spiro 12.5 mg qhs - Continue Toprol XL 50 mg daily    - Continue corlanor 7.5mg  BID for now.  - Not candidate for CRT-D upgrade due to narrow QRS. - Now s/p Barostim. Had it adjusted and increased today. - If no improvement post BaroStim placement, will consider repeat CPX testing>>RHC - BMET today, repeat 10 days.  2. Frequent PVCs/ Bigeminy - Holter Monitor 11/09/17 with 30% PVCs with one primary morphology.  - S/p PVC ablation 11/2017 with Dr Lovena Le unsuccessful - Off flecainide with EF down and persistent PVCs. Changed to amio - Zio Patch 04/2019 > 17% PVCs and >8% couplets.  - Zio Patch returned on 11/28/2019  - Amio recently increased back to 200 mg bid given increased burden.  - Per Dr. Lovena Le, due to the location of her PVCs, she is not a candidate for re-do ablation. Could consider referral to tertiary center, but success rate is not any more likely.  - c/w metoprolol.   3. Chronic Amiodarone Use  - I worry about her young age and long term use of amiodarone. This appears to be her only good option for PVC suppression.  -  Will need yearly PFTs w/ DLOC for surveillance. Recent study 4/22 showed mild restriction w/ normal diffusion. Repeat again 4/23.   - Recent TSH and HFTs were ok.  - She gets annual eye exams.  4. Morbid obesity - s/p Gastric Sleeve at [facility] 09/20/17  - Body mass index is 37.41 kg/m.    5. Daytime Fatigue - Sleep study in 2019 and again in 2021 were both negative for sleep apnea  - Reports compliance w/ Vit D supp + Fe supp  - Suspect due to chronic HF/ PVCs   6. DM2 - Well controlled, recent Hgb A1c 5.5. - Now on Jardiance for concomitant HF    Follow-up w/ Pharm D in 4 weeks and Dr. Haroldine Laws in 3 months   [place], [place] 11/29/20

## 2020-11-29 ENCOUNTER — Encounter: Payer: HMO | Admitting: Student

## 2020-11-29 ENCOUNTER — Ambulatory Visit (HOSPITAL_COMMUNITY)
Admission: RE | Admit: 2020-11-29 | Discharge: 2020-11-29 | Disposition: A | Discharge status: Home / Self Care | Payer: HMO | Source: Ambulatory Visit | Attending: Family Medicine | Admitting: Family Medicine

## 2020-11-29 ENCOUNTER — Other Ambulatory Visit: Payer: Self-pay

## 2020-11-29 ENCOUNTER — Encounter (HOSPITAL_COMMUNITY): Payer: Self-pay

## 2020-11-29 VITALS — BP 149/87 | HR 76 | Wt 224.8 lb

## 2020-11-29 DIAGNOSIS — Z9884 Bariatric surgery status: Secondary | ICD-10-CM

## 2020-11-29 DIAGNOSIS — Z5181 Encounter for therapeutic drug level monitoring: Secondary | ICD-10-CM | POA: Diagnosis not present

## 2020-11-29 DIAGNOSIS — I5042 Chronic combined systolic (congestive) and diastolic (congestive) heart failure: Secondary | ICD-10-CM | POA: Insufficient documentation

## 2020-11-29 DIAGNOSIS — Z886 Allergy status to analgesic agent status: Secondary | ICD-10-CM | POA: Insufficient documentation

## 2020-11-29 DIAGNOSIS — Z888 Allergy status to other drugs, medicaments and biological substances status: Secondary | ICD-10-CM | POA: Insufficient documentation

## 2020-11-29 DIAGNOSIS — Z881 Allergy status to other antibiotic agents status: Secondary | ICD-10-CM | POA: Insufficient documentation

## 2020-11-29 DIAGNOSIS — Z7984 Long term (current) use of oral hypoglycemic drugs: Secondary | ICD-10-CM | POA: Insufficient documentation

## 2020-11-29 DIAGNOSIS — I959 Hypotension, unspecified: Secondary | ICD-10-CM | POA: Diagnosis not present

## 2020-11-29 DIAGNOSIS — Z79899 Other long term (current) drug therapy: Secondary | ICD-10-CM | POA: Diagnosis not present

## 2020-11-29 DIAGNOSIS — Z95810 Presence of automatic (implantable) cardiac defibrillator: Secondary | ICD-10-CM | POA: Diagnosis not present

## 2020-11-29 DIAGNOSIS — I110 Hypertensive heart disease with heart failure: Secondary | ICD-10-CM | POA: Insufficient documentation

## 2020-11-29 DIAGNOSIS — I493 Ventricular premature depolarization: Secondary | ICD-10-CM | POA: Diagnosis not present

## 2020-11-29 DIAGNOSIS — Z882 Allergy status to sulfonamides status: Secondary | ICD-10-CM | POA: Diagnosis not present

## 2020-11-29 DIAGNOSIS — R5383 Other fatigue: Secondary | ICD-10-CM | POA: Diagnosis not present

## 2020-11-29 DIAGNOSIS — R008 Other abnormalities of heart beat: Secondary | ICD-10-CM | POA: Insufficient documentation

## 2020-11-29 DIAGNOSIS — Z6837 Body mass index (BMI) 37.0-37.9, adult: Secondary | ICD-10-CM | POA: Diagnosis not present

## 2020-11-29 DIAGNOSIS — Z8249 Family history of ischemic heart disease and other diseases of the circulatory system: Secondary | ICD-10-CM | POA: Diagnosis not present

## 2020-11-29 DIAGNOSIS — E119 Type 2 diabetes mellitus without complications: Secondary | ICD-10-CM | POA: Insufficient documentation

## 2020-11-29 LAB — BASIC METABOLIC PANEL
Anion gap: 5 (ref 5–15)
BUN: 5 mg/dL — ABNORMAL LOW (ref 6–20)
CO2: 27 mmol/L (ref 22–32)
Calcium: 8.9 mg/dL (ref 8.9–10.3)
Chloride: 106 mmol/L (ref 98–111)
Creatinine, Ser: 0.77 mg/dL (ref 0.44–1.00)
GFR, Estimated: >60 mL/min (ref >60)
Glucose, Bld: 95 mg/dL (ref 70–99)
Potassium: 3.1 mmol/L — ABNORMAL LOW (ref 3.5–5.1)
Sodium: 138 mmol/L (ref 135–145)

## 2020-11-29 MED ORDER — ENTRESTO 24-26 MG PO TABS: 1.0000 | ORAL_TABLET | Freq: Two times a day (BID) | ORAL | 11 refills | Status: DC

## 2020-11-29 MED ORDER — TORSEMIDE 20 MG PO TABS: ORAL_TABLET | ORAL | 3 refills | Status: DC

## 2020-11-29 NOTE — Patient Instructions (Addendum)
START Entresto 24/26 mg one tab daily CHANGE Torsemide to 20 mg daily for 5 days, then every other day thereafter   Labs today We will only contact you if something comes back abnormal or we need to make some changes. Otherwise no news is good news!   Labs needed in 10 days  Your physician recommends that you schedule a follow-up appointment in: 4 weeks  in the Advanced Practitioners (PA/NP) Clinic    Do the following things EVERYDAY: Weigh yourself in the morning before breakfast. Write it down and keep it in a log. Take your medicines as prescribed Eat low salt foods--Limit salt (sodium) to 2000 mg per day.  Stay as active as you can everyday Limit all fluids for the day to less than 2 liters  At the [facility], you and your health needs are our priority. As part of our continuing mission to provide you with exceptional heart care, we have created designated Provider Care Teams. These Care Teams include your primary Cardiologist (physician) and Advanced Practice Providers (APPs- Physician Assistants and Nurse Practitioners) who all work together to provide you with the care you need, when you need it.   You may see any of the following providers on your designated Care Team at your next follow up: Dr Glori Bickers Dr Loralie Champagne Dr Patrice Paradise, NP Lyda Jester, [place] Ginnie Smart Audry Riles, PharmD   Please be sure to bring in all your medications bottles to every appointment.   If you have any questions or concerns before your next appointment please send [place] a message through [place] or call our office at [phone].    TO LEAVE A MESSAGE FOR THE NURSE SELECT OPTION 2, PLEASE LEAVE A MESSAGE INCLUDING: YOUR NAME DATE OF BIRTH CALL BACK NUMBER REASON FOR CALL**this is important as we prioritize the call backs  YOU WILL RECEIVE A CALL BACK THE SAME DAY AS LONG AS YOU CALL BEFORE 4:00 PM

## 2020-12-03 ENCOUNTER — Other Ambulatory Visit (HOSPITAL_COMMUNITY): Payer: Self-pay | Admitting: *Deleted

## 2020-12-03 DIAGNOSIS — I5022 Chronic systolic (congestive) heart failure: Secondary | ICD-10-CM

## 2020-12-03 MED ORDER — TORSEMIDE 20 MG PO TABS: ORAL_TABLET | ORAL | 3 refills | Status: DC

## 2020-12-06 ENCOUNTER — Encounter (HOSPITAL_COMMUNITY): Payer: Self-pay

## 2020-12-06 ENCOUNTER — Other Ambulatory Visit (HOSPITAL_COMMUNITY): Payer: HMO

## 2020-12-10 ENCOUNTER — Telehealth (HOSPITAL_COMMUNITY): Payer: Self-pay | Admitting: Pharmacy Technician

## 2020-12-10 ENCOUNTER — Other Ambulatory Visit: Payer: Self-pay

## 2020-12-10 ENCOUNTER — Other Ambulatory Visit (HOSPITAL_COMMUNITY): Payer: Self-pay

## 2020-12-10 NOTE — Telephone Encounter (Signed)
Advanced Heart Failure Patient Advocate Encounter  Patient was started on Entresto, current 30 day co-pay $92.08.  Called patient to check affordability. Can sign up for PAN HF grant if she calls back in time.  Charlann Boxer, CPhT

## 2020-12-12 ENCOUNTER — Encounter (HOSPITAL_COMMUNITY): Payer: Self-pay | Admitting: Cardiology

## 2020-12-16 ENCOUNTER — Encounter: Payer: HMO | Admitting: Student

## 2020-12-17 ENCOUNTER — Ambulatory Visit (INDEPENDENT_AMBULATORY_CARE_PROVIDER_SITE_OTHER): Payer: HMO

## 2020-12-17 DIAGNOSIS — I428 Other cardiomyopathies: Secondary | ICD-10-CM

## 2020-12-17 LAB — CUP PACEART REMOTE DEVICE CHECK
Battery Remaining Longevity: 132 mo
Battery Remaining Percentage: 93 %
Brady Statistic RV Percent Paced: 0 %
Date Time Interrogation Session: 20220725141900
HighPow Impedance: 69 Ohm
Implantable Lead Implant Date: 20180312
Implantable Lead Location: 753860
Implantable Lead Model: 293
Implantable Lead Serial Number: 422842
Implantable Pulse Generator Implant Date: 20180312
Lead Channel Impedance Value: 541 Ohm
Lead Channel Setting Pacing Amplitude: 2.5 V
Lead Channel Setting Pacing Pulse Width: 0.4 ms
Lead Channel Setting Sensing Sensitivity: 0.5 mV
Pulse Gen Serial Number: 226601

## 2020-12-18 IMAGING — DX LEFT ANKLE COMPLETE - 3+ VIEW
3 series · 3 of 3 positions shown · non-contrast
Comparison: 10/21/2013

CLINICAL DATA: 42-year-old female with a history of left ankle
injury

EXAM:
LEFT ANKLE COMPLETE - 3+ VIEW

[ankle ap]
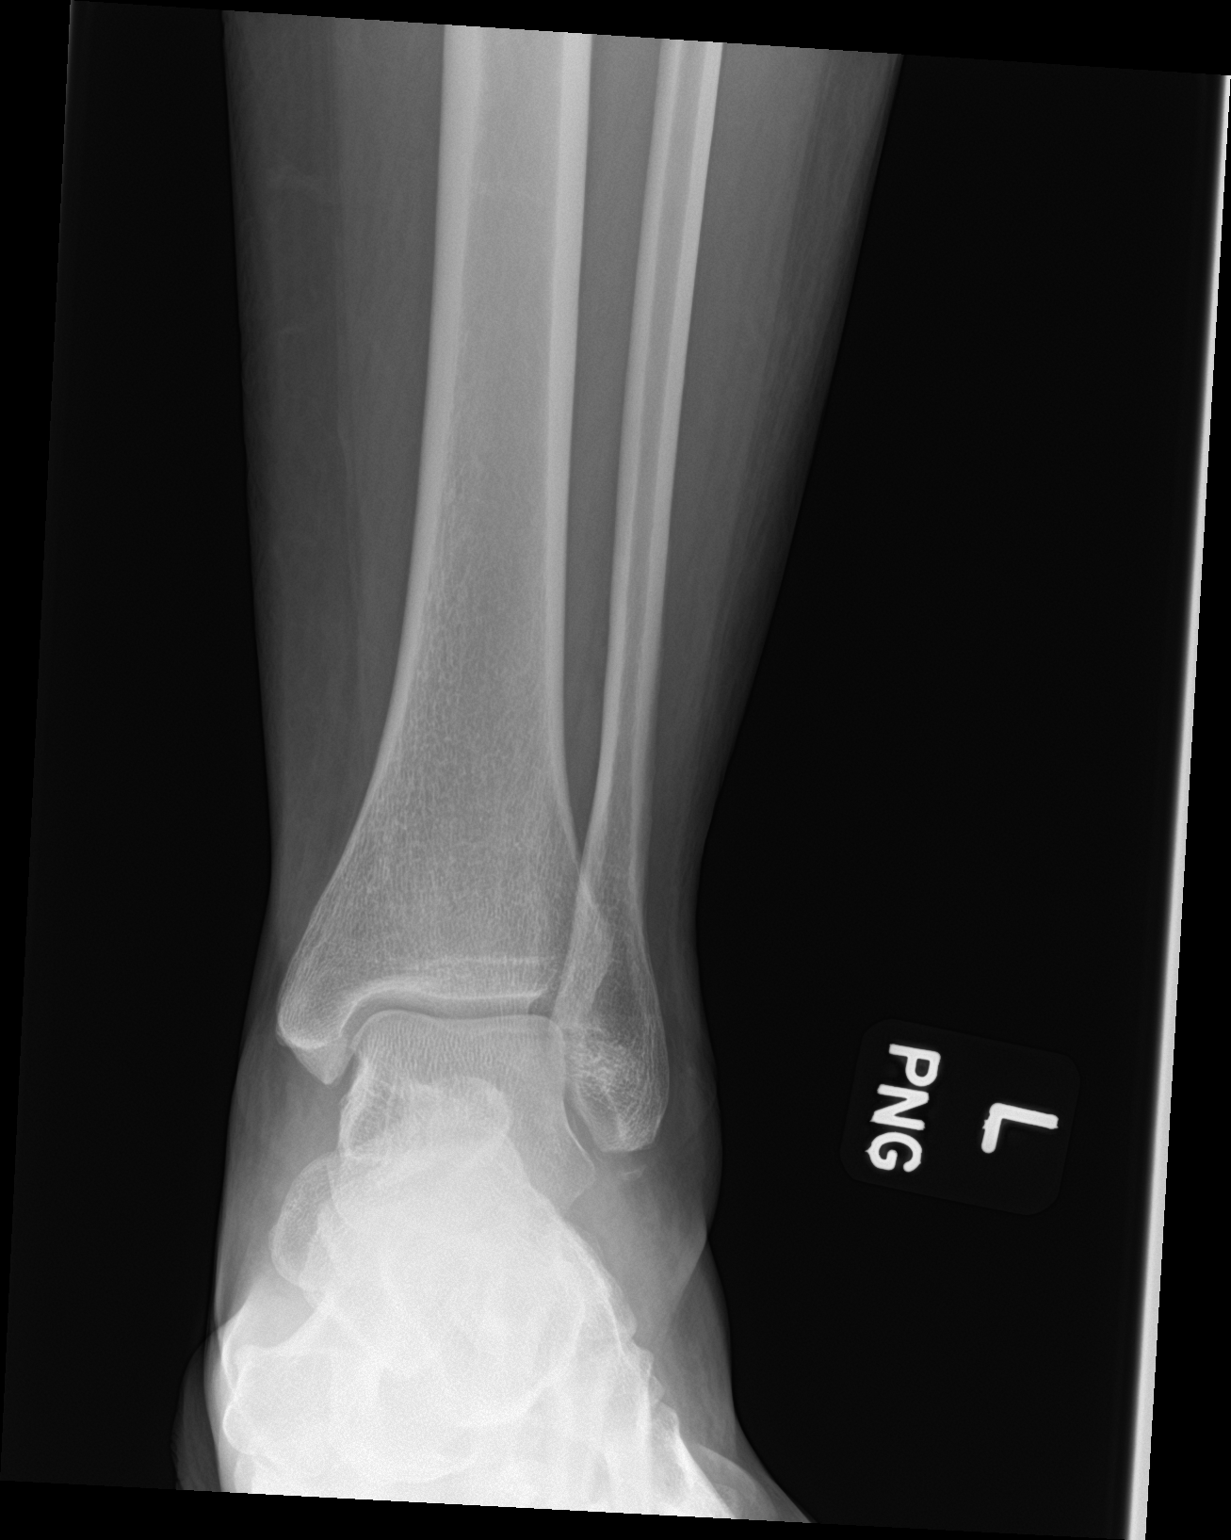

[ankle obl]
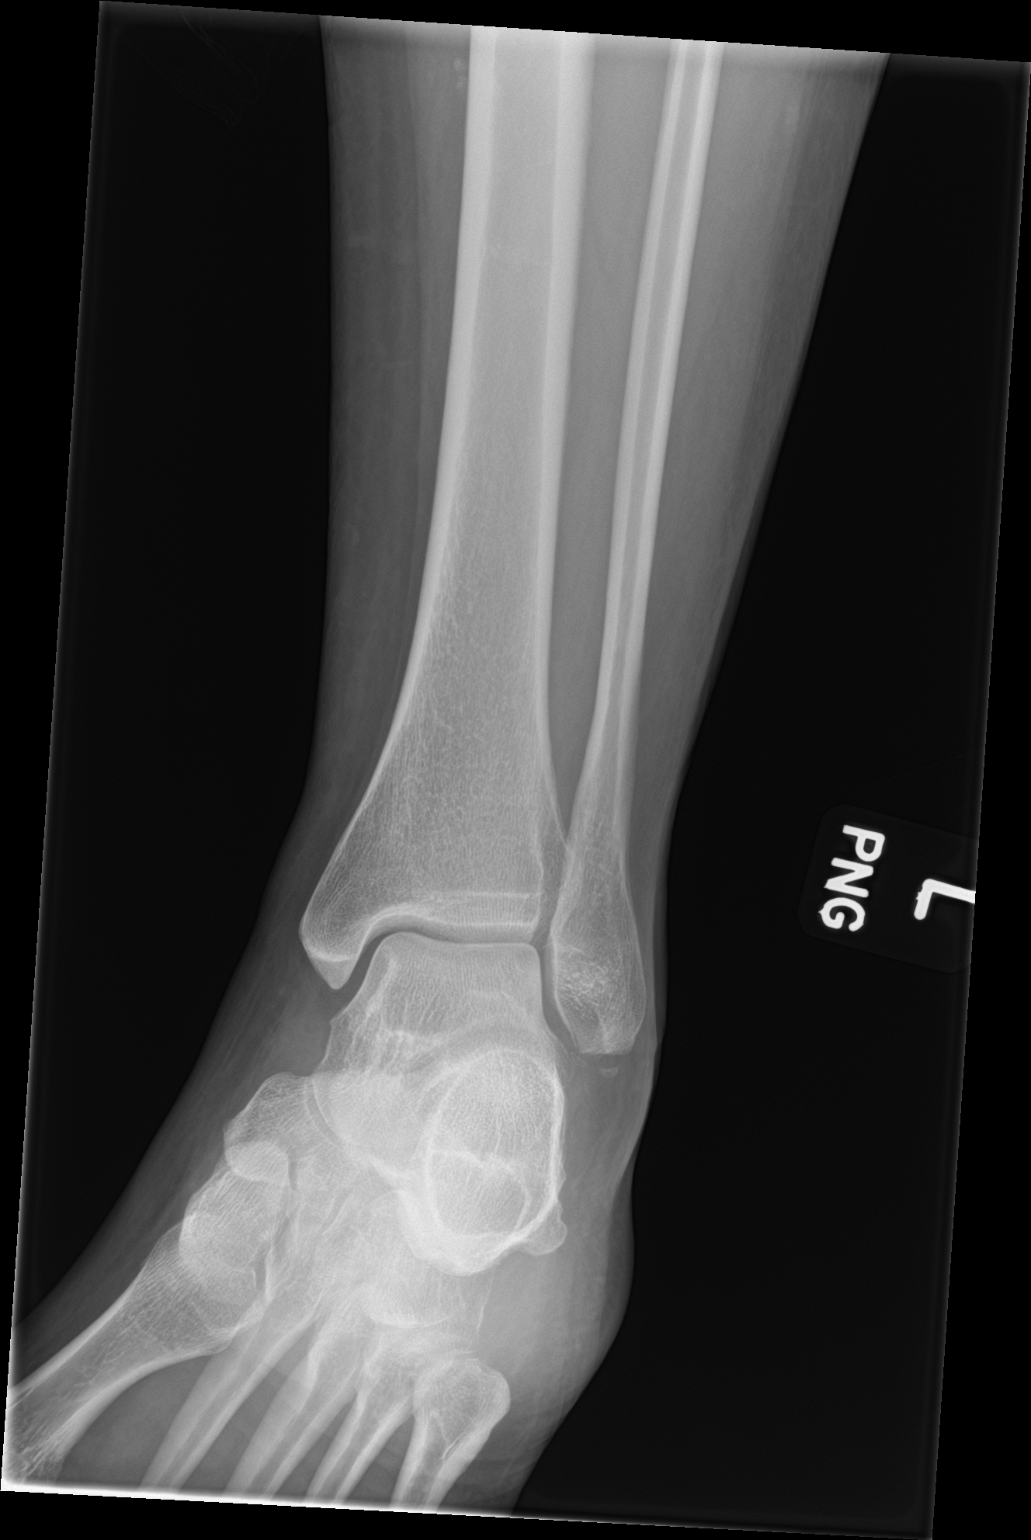

[ankle lat]
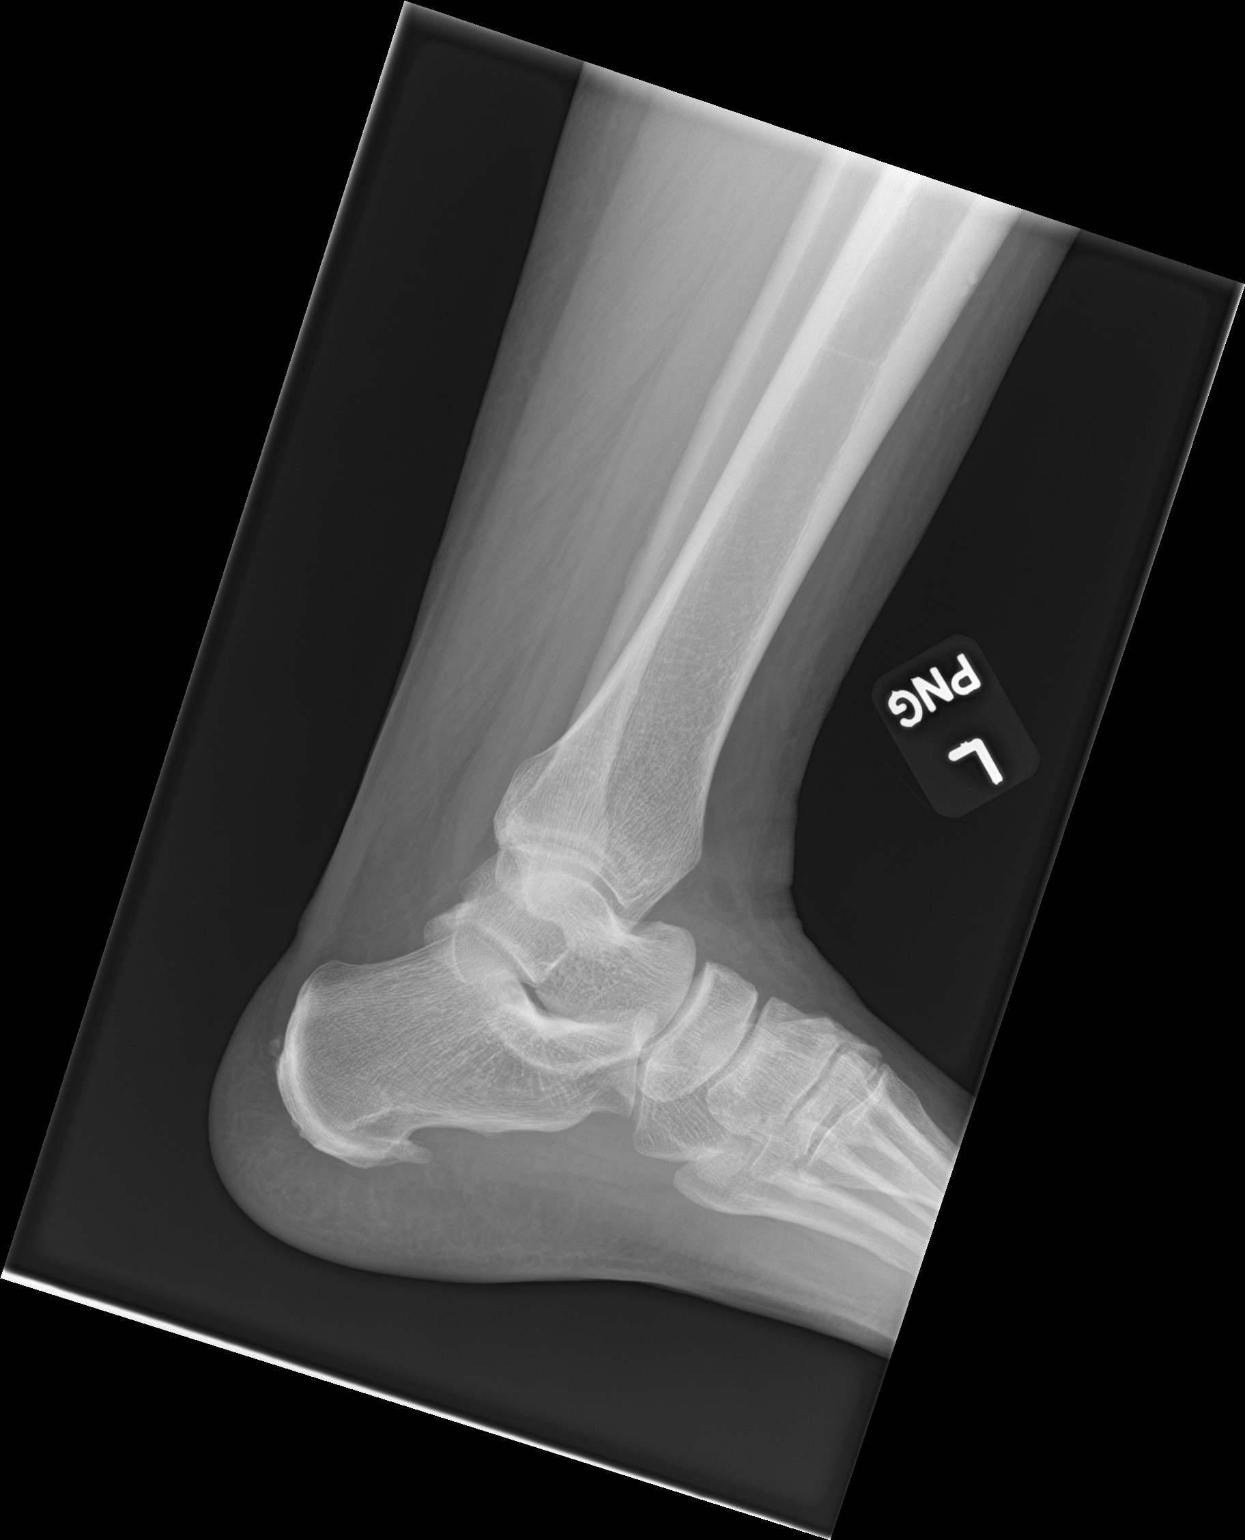

[3 of 3 positions shown; findings below may reference images not displayed]

FINDINGS: There is a series of crescent calcifications distal to the left
fibula which were present on the prior plain film of 10/21/2013.
Linear lucency between the talus and the fibula, not present on the
comparison. The corticated fragment distal to the fibular cortex is
likely chronic.

Soft tissue swelling on the lateral ankle. No evidence of joint
effusion. Degenerative changes of the hindfoot and the midfoot.

Mortise is congruent
IMPRESSION: There are 2 linear crescentic lucencies at the distal fibula, in a
region of prior injury (10/21/2013) which may reflect prior injury,
or represent acute chip/avulsion fracture.

A third corticated focus distal to the fibula is favored to be
chronic.

Soft tissue swelling at the lateral ankle.

## 2020-12-23 ENCOUNTER — Ambulatory Visit (INDEPENDENT_AMBULATORY_CARE_PROVIDER_SITE_OTHER): Payer: HMO

## 2020-12-23 DIAGNOSIS — I5042 Chronic combined systolic (congestive) and diastolic (congestive) heart failure: Secondary | ICD-10-CM

## 2020-12-23 DIAGNOSIS — Z95810 Presence of automatic (implantable) cardiac defibrillator: Secondary | ICD-10-CM

## 2020-12-23 NOTE — Progress Notes (Signed)
Advanced Heart Failure Clinic Note   PCP: Hoyt Koch, MD PCP-Cardiologist: Sinclair Grooms, MD  [facility]: Dr. Haroldine Laws   Reason for Visit: F/u for Chronic Systolic Heart Failure  HPI:  JASNOOR KIN is a 45 y.o. 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    Was admitted 12/5-11/2016 with recurrent HF, URI, CP and elevated BP (160/110). CT chest no PE. Diuresed from 233-> 228 pounds.    Admitted 11/2017 with CP. Troponins were negative. Symptoms thought to be related to frequent PVCs. EP consulted and scheduled her for PVC ablation. Echo repeated 11/22/17 and showed improved EF 50-55%. HF team consulted and changed torsemide to 40 mg daily (from 3x/week).    Underwent PVC ablation 12/01/17. Was felt not to be successful.  Was on flecainide but discontinued due to reduced EF. Now on amiodarone.    Repeat Echo 10/2018 showed drop in EF back down to 25-30%. Was instructed to f/u in clinic but pt did not. She had outpatient monitor 04/2018 that showed rare PVCs.    Evaluated in the ED 04/11/20 w/ CC of chest pain. Hs Troponin normal x 2 (4>>5). EKG showed sinus tach 104 w/ frequent PVCs. Device interrogation was unremarkable. She was released from the ED and instructed to f/u in clinic.     Saw EP on 12/11 with suggestion for bb again.    Admitted 10/2019 with chest pain and SOB. Viral panel negative. ECHO completed and showed EF < 20% and normal RV. Had cath with preserved cardiac output. Plan was to f/u with EP regarding PVCs. She has remained on AAD therapy w/ amiodarone + ? blocker therapy.  Admitted to [facility] 4/22 w/ A/C HR and increased PVC burden. Echo showed EF 20-25%, RV normal. She was diuresed w/ IV Lasix and  placed on amiodarone gtt for PVC suppression. She was over diuresed, and was given back IVFs. BP remained soft, and Entresto & torsemide were discontinued. Jardiance started at discharge. ? blocker was changed from Coreg to Toprol XL 50 mg daily given low BP. EP was also consulted and recommended increasing PO amio to 200 mg bid at d/c w/ close EP clinic f/u.   Saw Dr. Lovena Le, due to the location of her PVCs, she is not a candidate for re-do ablation. Could consider referral to tertiary center, but success rate is not any more likely.  ? blocker changed from Coreg, to metoprolol to see if more cardioselective ? blocker would be of more benefit. Referred for Barostim.  S/p Barostim 6/22.  Today she returns for HF follow up. Worsening fatigue over the 4 days. Able to go to a tap dance class, had some SOB 30 minutes in, resolved with sitting down. Denies CP, dizziness, edema, or PND/Orthopnea. Appetite ok. No fever or chills. Weight at home 219 pounds. Taking all medications. BP at home 130s recently.  Cardiac Studies  - Echo (12/19): EF back down to 20-25% - Bedside echo by Dr. Haroldine Laws with EF improved to 50%. - Echo (6/20): EF back down at 25-30%.   - Cath (6/17): revealed normal vessels, moderate elevation in pulmonary artery pressures, and LVEF 25-30%.   - RHC (6/21):   RA = 4 RV = 32/6 PA = 31/8 (21)  PCW = 12 Fick cardiac output/index = 7.0/3.4 Thermo CO/CI = 5.2/2.5 PVR =1.7 (Thermo) Ao sat = 98% PA sat = 67%, 69%   - RHC (3/19): RA = 2 RV = 26/7 PA = 28/13 (19) PCW = 5 Fick cardiac output/index = 5.7/2.8 PVR = 2.5 WU Ao sat = 99% PA sat = 71%, 72% High SVC sat = 65%   - RHC (6/17): RA 5 RV 35/6 PA 41/12 PW 11 Fick 6.4/3.0 PA sat 62%  - Echo 6/15 35-40% - Echo 6/17 25-30% - Echo 12/17 20-25% - Echo 8/18 EF 25% - Echo 7/19: EF 50-55% - Echo 6/20: EF 25-30% - Echo 11/13/19: Ef < 20% RV normal  - Echo 4/22: EF 20-25%, RV normal    - CPX (2/18): FVC 2.57 (79%)       FEV1 2.14 (81%)        FEV1/FVC 83 (101%)        MVV 107 (102%) Resting HR: 106 Peak HR: 166   (93% age predicted max HR) BP rest: 136/98 BP peak: 174/88 Peak VO2: 17.1 (85% predicted peak VO2) - corrects to 28.4 for ibw VE/VCO2 slope:  33 OUES: 2.16 Peak RER: 1.09 Ventilatory Threshold: 14.6 (73% predicted or measured peak VO2) VE/MVV:  59% PETCO2 at peak:  33 O2pulse:  10   (83% predicted O2pulse)   - CPX (12/18): pVO2 14.0 (corrects to 23.0 for IBW) VeVCO2  32 RER 1.0  - PFTs w/ DLOC (4/22) Mild Restriction Normal Diffusion   Review of systems complete and found to be negative unless listed in HPI.    Past Medical History:  Diagnosis Date   Acute on chronic systolic (congestive) heart failure (HCC) 04/29/2017   Acute pain of right shoulder 06/16/2017   AICD (automatic cardioverter/defibrillator) present    Anginal pain (HCC)    Anxiety    Arthritis    right shoulder    Asthma    CHF (congestive heart failure) ([place])    Chronic combined systolic and diastolic heart failure ([place]) 03/05/2014   Closed low lateral malleolus fracture 10/23/2013   Cystitis 10/21/2017   Depression    Depression with anxiety 01/20/2013   Diabetes mellitus without complication (HCC)    Diverticulosis    Dyspnea    comes and goes intermittently mostly with exertion    Dysrhythmia    Essential hypertension    Prev followed by [name]    Fibroid    age 44   Gallstones    Generalized abdominal cramping    History of cardiomyopathy    Hypertension    IBS (irritable bowel syndrome)    ICD (implantable cardioverter-defibrillator), single, 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 CPAP intolerance >> "smothering" Good tolerance of auto device (optimal pressure 12-13 on download).  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 18 g 5   amiodarone (PACERONE) 200 MG tablet Take 1 tablet (200 mg total) by mouth 2 (two) times daily. 60 tablet 2   budesonide-formoterol (SYMBICORT) 160-4.5 MCG/ACT inhaler Inhale 2 puffs into the lungs 2 (two) times daily as needed. 30.6 each 3   cyanocobalamin (,VITAMIN B-12,) 1000 MCG/ML injection Inject 1 mL (1,000 mcg total) into the muscle as directed. Once daily for 7 days, then once weekly for 4 weeks followed by once monthly 11 mL 1   diclofenac (VOLTAREN) 75 MG EC tablet Take 1 tablet (75 mg total) by mouth 2 (two) times daily as needed. 30 tablet 0   diclofenac Sodium (VOLTAREN) 1 % GEL Apply 4 g topically 4 (four) times daily. 300 g 6   dicyclomine (BENTYL) 20 MG tablet Take 1 tablet (20 mg total) by mouth daily as needed for spasms. 90 tablet 3   empagliflozin (JARDIANCE) 10 MG TABS tablet Take 1 tablet (10 mg total) by mouth daily. 30 tablet 3   fluticasone (FLONASE) 50 MCG/ACT nasal spray Place 2 sprays into both nostrils daily as needed for allergies. 48 g 0   gabapentin (NEURONTIN) 300 MG capsule Take 1 capsule (300 mg total) by mouth at bedtime as  needed (back spasms). 90 capsule 3   ipratropium-albuterol (DUONEB) 0.5-2.5 (3) MG/3ML SOLN Take 3 mLs by nebulization every 6 (six) hours as needed. 360 mL 0   ivabradine (CORLANOR) 7.5 MG TABS tablet Take 1 tablet (7.5 mg total) by mouth 2 (two) times daily with a meal. 60 tablet 2   lidocaine (LIDODERM) 5 % Place 1 patch onto the skin daily. Remove & Discard patch within 12 hours or as directed by MD 90 patch 3   Magnesium Oxide 200 MG TABS Take 1 tablet (200 mg total) by mouth daily. 90 tablet 3   metoprolol succinate (TOPROL-XL) 50 MG 24 hr tablet Take 1 tablet (50 mg total) by mouth daily. Take with or immediately following a meal. 30 tablet 3   Multiple Vitamins-Minerals (MULTIVITAMIN GUMMIES WOMENS PO) Take 2 tablets by mouth at bedtime.      nitroGLYCERIN (NITROSTAT) 0.4 MG SL tablet Place 1 tablet (0.4 mg total) under the tongue every 5 (five) minutes as needed for chest pain. 75 tablet 0   omeprazole (PRILOSEC) 20 MG capsule Take 1 capsule (20 mg total) by mouth daily as needed (for reflux symptoms). 30 capsule 0   sacubitril-valsartan (ENTRESTO) 24-26 MG Take 1 tablet by mouth 2 (two) times daily. 60 tablet 11   spironolactone (ALDACTONE) 25 MG tablet Take 0.5 tablets (12.5 mg total) by mouth at bedtime. 90 tablet 3   tiZANidine (ZANAFLEX) 4 MG tablet Take 4 mg by mouth every 8 (eight) hours as needed for muscle spasms.      torsemide (DEMADEX) 20 MG tablet Take 1 tablet (20 mg total) by mouth daily for 5 days, THEN 1 tablet (20 mg total) every other day. 30 tablet 3   Vitamin D, Ergocalciferol, (DRISDOL) 1.25 MG (50000 UNIT) CAPS capsule Take 1 capsule (50,000 Units total) by mouth every 7 (seven) days. (Patient  taking differently: Take 50,000 Units by mouth every 7 (seven) days. Wednesday) 12 capsule 0   No current facility-administered medications for this encounter.   Allergies  Allergen Reactions   Vancomycin Other (See Comments)    "did something to my kidneys," PROGRESSED TO  KIDNEY FAILURE!!   Aspirin Other (See Comments)    Wheezing, (Pt states that she just wheezes some when she takes aspirin by itself but she can take aspirin in a combination product).   Contrast Media [Iodinated Diagnostic Agents] Other (See Comments)    Multiple CT contrast studies done over 2 weeks caused ARF   Cyclobenzaprine Other (See Comments)    Can not tolerate   Ciprofloxacin Itching and Rash   Farxiga [Dapagliflozin] Rash   Sulfa Antibiotics Itching and Rash   Social History   Socioeconomic History   Marital status: Married    Spouse name: Not on file   Number of children: 1   Years of education: Not on file   Highest education level: Not on file  Occupational History   Occupation: stay at home mom  Tobacco Use   Smoking status: Never   Smokeless tobacco: Never  Vaping Use   Vaping Use: Never used  Substance and Sexual Activity   Alcohol use: No   Drug use: No   Sexual activity: Yes  Other Topics Concern   Not on file  Social History Narrative   Not on file   Social Determinants of Health   Financial Resource Strain: Not on file  Food Insecurity: Not on file  Transportation Needs: Not on file  Physical Activity: Not on file  Stress: Not on file  Social Connections: Not on file  Intimate Partner Violence: Not on file   Family History  Problem Relation Age of Onset   Emphysema Maternal Grandmother        smoked   Heart disease Maternal Grandmother 22       MI   Rheum arthritis Mother    Allergies Daughter    Colon cancer Neg Hx    BP (!) 150/99   Pulse 62   Wt 100.7 kg (222 lb)   SpO2 97%   BMI 36.94 kg/m   Wt Readings from Last 3 Encounters:  12/24/20 100.7 kg (222 lb)  11/29/20 102 kg (224 lb 12.8 oz)  11/08/20 100.7 kg (222 lb)   PHYSICAL EXAM: General:  NAD. No resp difficulty HEENT: Normal Neck: Supple. JVP 7-8. Carotids 2+ bilat; no bruits. No lymphadenopathy or thryomegaly appreciated. Cor: PMI nondisplaced. Irregular rate &  rhythm. No rubs, gallops or murmurs. Lungs: Clear Abdomen: Obese, nontender, nondistended. No hepatosplenomegaly. No bruits or masses. Good bowel sounds. Extremities: No cyanosis, clubbing, rash, trace LE edema Neuro: Alert & oriented x 3, cranial nerves grossly intact. Moves all 4 extremities w/o difficulty. Affect pleasant.  [employer] Score: 27, fluid index up, mean HR 81, no VT, thoracic impedence stable (personally reviewed).     ECG: SR w/ PCVs (personally reviewed)  ASSESSMENT & PLAN:  1. Chronic systolic HF:  due to peri-partum CM, onset 1999. S/P [employer] ICD 07/2016 - EF 20-25% (12/17).  - Echo 8/18 EF 25-30% s/p ICD 3/18. [place] 3/19: RA 2, PA 28/13, Fick CO 5.7, CI 2.8 - CPX 12/18 showed moderate limitation due mostly to obesity with some HF component - Echo 11/22/17 LVEF 50-55%, Grade 1 DD, Mild MR, Mod LAE - Echo 04/2018 by Dr Haroldine Laws EF 20-25%. RV ok  - Echo 10/2019 EF <  20%. - Echo 08/2020 EF 20-25%, RV normal.  - NYHA III. Volume status elevated. HL Score 27. - Increase Entresto to 49/51 mg bid.  - Increase torsemide to 40 mg daily x 3 days, then down to 20 my daily. - Start 40 KCl daily x 3 days then down to 20 mEq KCl daily. - Continue Jardiance 10 mg. - Continue Spiro 12.5 mg qhs. - Continue Toprol XL 50 mg daily.    - Continue Corlanor 7.5 mg bid for now. HR 84 today. - Not candidate for CRT-D upgrade due to narrow QRS. - Now s/p Barostim.  - If no improvement post BaroStim placement, will consider repeat CPX testing>>RHC. - BMET today, repeat in 10 days.  2. Frequent PVCs - Holter Monitor 11/09/17 with 30% PVCs with one primary morphology.  - S/p PVC ablation 11/2017 with Dr Lovena Le unsuccessful - Off flecainide with EF down and persistent PVCs. Changed to amio. - Zio Patch 04/2019 > 17% PVCs and >8% couplets.  - Zio Patch returned on 11/28/2019.  - Per Dr. Lovena Le, due to the location of her PVCs, she is not a candidate for re-do ablation. Could  consider referral to tertiary center, but success rate is not any more likely.  - Continue Toprol XL.  - Continue amio 200 mg bid.  3. Chronic Amiodarone Use  - I worry about her young age and long term use of amiodarone. This appears to be her only good option for PVC suppression.  -  Will need yearly PFTs w/ DLOC for surveillance. Recent study 4/22 showed mild restriction w/ normal diffusion. Repeat again 4/23.   - Recent TSH and HFTs were ok.  - She gets annual eye exams.  4. Morbid obesity - s/p Gastric Sleeve at [facility] 09/20/17.  - Body mass index is 36.94 kg/m.  - Encouraged dietary changes and continue w/ tap classes.   5. Daytime Fatigue - Sleep study in 2019 and again in 2021 were both negative for sleep apnea.  - Reports compliance w/ Vit D supp. - Suspect due to chronic HF/ PVCs & ? Depression.   6. DM2 - Hgb A1c 5.5. - On Jardiance for concomitant HF.    Personally discussed with Dr. Haroldine Laws, will diurese and she will follow up with Oda Kilts next week about her Barostim. Will decide at follow up visit with Dr. Haroldine Laws next month on next steps (repeat CPX vs RHC).  [place], FNP 12/24/20

## 2020-12-24 ENCOUNTER — Other Ambulatory Visit: Payer: Self-pay

## 2020-12-24 ENCOUNTER — Ambulatory Visit (HOSPITAL_COMMUNITY)
Admission: RE | Admit: 2020-12-24 | Discharge: 2020-12-24 | Disposition: A | Discharge status: Home / Self Care | Payer: HMO | Source: Ambulatory Visit | Attending: Family Medicine | Admitting: Family Medicine

## 2020-12-24 ENCOUNTER — Encounter (HOSPITAL_COMMUNITY): Payer: Self-pay

## 2020-12-24 VITALS — BP 150/99 | HR 62 | Wt 222.0 lb

## 2020-12-24 DIAGNOSIS — I110 Hypertensive heart disease with heart failure: Secondary | ICD-10-CM | POA: Insufficient documentation

## 2020-12-24 DIAGNOSIS — Z9884 Bariatric surgery status: Secondary | ICD-10-CM | POA: Insufficient documentation

## 2020-12-24 DIAGNOSIS — R5383 Other fatigue: Secondary | ICD-10-CM | POA: Diagnosis not present

## 2020-12-24 DIAGNOSIS — Z886 Allergy status to analgesic agent status: Secondary | ICD-10-CM | POA: Insufficient documentation

## 2020-12-24 DIAGNOSIS — I493 Ventricular premature depolarization: Secondary | ICD-10-CM | POA: Insufficient documentation

## 2020-12-24 DIAGNOSIS — K589 Irritable bowel syndrome without diarrhea: Secondary | ICD-10-CM | POA: Insufficient documentation

## 2020-12-24 DIAGNOSIS — Z95810 Presence of automatic (implantable) cardiac defibrillator: Secondary | ICD-10-CM | POA: Insufficient documentation

## 2020-12-24 DIAGNOSIS — I5022 Chronic systolic (congestive) heart failure: Secondary | ICD-10-CM | POA: Diagnosis not present

## 2020-12-24 DIAGNOSIS — Z79899 Other long term (current) drug therapy: Secondary | ICD-10-CM | POA: Diagnosis not present

## 2020-12-24 DIAGNOSIS — I5042 Chronic combined systolic (congestive) and diastolic (congestive) heart failure: Secondary | ICD-10-CM | POA: Insufficient documentation

## 2020-12-24 DIAGNOSIS — I5040 Unspecified combined systolic (congestive) and diastolic (congestive) heart failure: Secondary | ICD-10-CM

## 2020-12-24 DIAGNOSIS — Z86718 Personal history of other venous thrombosis and embolism: Secondary | ICD-10-CM | POA: Insufficient documentation

## 2020-12-24 DIAGNOSIS — Z6836 Body mass index (BMI) 36.0-36.9, adult: Secondary | ICD-10-CM | POA: Diagnosis not present

## 2020-12-24 DIAGNOSIS — Z881 Allergy status to other antibiotic agents status: Secondary | ICD-10-CM | POA: Diagnosis not present

## 2020-12-24 DIAGNOSIS — G4733 Obstructive sleep apnea (adult) (pediatric): Secondary | ICD-10-CM | POA: Diagnosis not present

## 2020-12-24 DIAGNOSIS — Z7951 Long term (current) use of inhaled steroids: Secondary | ICD-10-CM | POA: Insufficient documentation

## 2020-12-24 DIAGNOSIS — Z7984 Long term (current) use of oral hypoglycemic drugs: Secondary | ICD-10-CM | POA: Diagnosis not present

## 2020-12-24 DIAGNOSIS — E119 Type 2 diabetes mellitus without complications: Secondary | ICD-10-CM | POA: Insufficient documentation

## 2020-12-24 DIAGNOSIS — Z882 Allergy status to sulfonamides status: Secondary | ICD-10-CM | POA: Diagnosis not present

## 2020-12-24 DIAGNOSIS — Z5181 Encounter for therapeutic drug level monitoring: Secondary | ICD-10-CM | POA: Diagnosis not present

## 2020-12-24 DIAGNOSIS — Z91041 Radiographic dye allergy status: Secondary | ICD-10-CM | POA: Insufficient documentation

## 2020-12-24 DIAGNOSIS — Z8249 Family history of ischemic heart disease and other diseases of the circulatory system: Secondary | ICD-10-CM | POA: Diagnosis not present

## 2020-12-24 LAB — BASIC METABOLIC PANEL
Anion gap: 7 (ref 5–15)
BUN: 7 mg/dL (ref 6–20)
CO2: 24 mmol/L (ref 22–32)
Calcium: 8.8 mg/dL — ABNORMAL LOW (ref 8.9–10.3)
Chloride: 105 mmol/L (ref 98–111)
Creatinine, Ser: 0.8 mg/dL (ref 0.44–1.00)
GFR, Estimated: >60 mL/min (ref >60)
Glucose, Bld: 95 mg/dL (ref 70–99)
Potassium: 3.9 mmol/L (ref 3.5–5.1)
Sodium: 136 mmol/L (ref 135–145)

## 2020-12-24 MED ORDER — POTASSIUM CHLORIDE CRYS ER 20 MEQ PO TBCR: EXTENDED_RELEASE_TABLET | ORAL | 3 refills | Status: DC

## 2020-12-24 MED ORDER — TORSEMIDE 20 MG PO TABS: ORAL_TABLET | ORAL | 3 refills | Status: DC

## 2020-12-24 MED ORDER — ENTRESTO 49-51 MG PO TABS: 1.0000 | ORAL_TABLET | Freq: Two times a day (BID) | ORAL | 6 refills | Status: DC

## 2020-12-24 NOTE — Patient Instructions (Signed)
INCREASE Entresto to 49/51 mg, one tab twice a day INCREASE Torsemide to 40 mg daily for 3 days, then resume normal dose of 20 mg daily thereafter INCREASE Potassium to 40 meq daily for 3 days, then resume 20 meq daily   Labs today We will only contact you if something comes back abnormal or we need to make some changes. Otherwise no news is good news!  Labs needed in 7-10 days  Keep followup as scheduled  Do the following things EVERYDAY: Weigh yourself in the morning before breakfast. Write it down and keep it in a log. Take your medicines as prescribed Eat low salt foods--Limit salt (sodium) to 2000 mg per day.  Stay as active as you can everyday Limit all fluids for the day to less than 2 liters  milAt the [facility] Failure Clinic, you and your health needs are our priority. As part of our continuing mission to provide you with exceptional heart care, we have created designated Provider Care Teams. These Care Teams include your primary Cardiologist (physician) and Advanced Practice Providers (APPs- Physician Assistants and Nurse Practitioners) who all work together to provide you with the care you need, when you need it.   You may see any of the following providers on your designated Care Team at your next follow up: Dr Glori Bickers Dr Loralie Champagne Dr Patrice Paradise, NP Lyda Jester, [place] Ginnie Smart Audry Riles, PharmD   Please be sure to bring in all your medications bottles to every appointment.

## 2020-12-25 NOTE — Progress Notes (Signed)
[name] [name] Phone: [phone] Subjective:   [name], am serving as a scribe for Dr. Hulan Saas.  I'm seeing this patient by the request  of:  Megan Koch, MD  CC: Right hip pain  [id number]  [name] is a 45 y.o. female coming in with complaint of R hip pain. Injected L hip 07/01/2020. Patient states that Right hip feels like it is on fire and that has been the past week. States she feels like her hip is not balanced and that right above her buttock on the right side is painful and radiates down the center of the buttock patient denies any significant radiation down the leg.  Patient does state that it can wake her up at night.  Difficulty to sleep on the right side.       Past Medical History:  Diagnosis Date   Acute on chronic systolic (congestive) heart failure (HCC) 04/29/2017   Acute pain of right shoulder 06/16/2017   AICD (automatic cardioverter/defibrillator) present    Anginal pain (HCC)    Anxiety    Arthritis    right shoulder    Asthma    CHF (congestive heart failure) ([place])    Chronic combined systolic and diastolic heart failure ([place]) 03/05/2014   Closed low lateral malleolus fracture 10/23/2013   Cystitis 10/21/2017   Depression    Depression with anxiety 01/20/2013   Diabetes mellitus without complication (HCC)    Diverticulosis    Dyspnea    comes and goes intermittently mostly with exertion    Dysrhythmia    Essential hypertension    Prev followed by [facility]/ Cardiology    Fibroid    age 62   Gallstones    Generalized abdominal cramping    History of cardiomyopathy    Hypertension    IBS (irritable bowel syndrome)    ICD (implantable cardioverter-defibrillator), single, 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 CPAP intolerance >> "smothering" Good tolerance of auto device (optimal pressure 12-13 on download).  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   PVC (premature ventricular contraction) 06/23/2016   Seizures ([place])    "as a child" (08/03/2016)   Termination of pregnancy    due to cardiac risk   Past Surgical History:  Procedure Laterality Date   CARDIAC CATHETERIZATION N/A 11/11/2015   Procedure: Right/Left Heart Cath and Coronary Angiography;  Surgeon: Troy Sine, MD;  Location: [facility];  Service: Cardiovascular;  Laterality: N/A;   CARDIAC CATHETERIZATION  ~ 2015   CARDIAC DEFIBRILLATOR PLACEMENT  08/03/2016   CESAREAN SECTION  1999   COLONOSCOPY WITH PROPOFOL N/A 04/21/2016   Procedure: COLONOSCOPY WITH PROPOFOL;  Surgeon: Jerene Bears, MD;  Location: [facility];  Service: Gastroenterology;  Laterality: N/A;  ESOPHAGOGASTRODUODENOSCOPY (EGD) WITH PROPOFOL N/A 04/21/2016   Procedure: ESOPHAGOGASTRODUODENOSCOPY (EGD) WITH PROPOFOL;  Surgeon: Jerene Bears, MD;  Location: [facility];  Service: Gastroenterology;  Laterality: N/A;   FOOT FRACTURE SURGERY Right ~ 2003   FRACTURE SURGERY     ICD IMPLANT N/A 08/03/2016   Procedure: ICD Implant;  Surgeon: Deboraha Sprang, MD;  Location: [facility];  Service: Cardiovascular;  Laterality: N/A;   LAPAROSCOPIC CHOLECYSTECTOMY  12/2006   LAPAROSCOPIC GASTRIC SLEEVE RESECTION     LAPAROSCOPY ABDOMEN DIAGNOSTIC  2008   "cut bile duct w/gallbladder  OR; had to go in later & fix leak; hospitalized for 2 months"   LEFT HEART CATHETERIZATION WITH CORONARY ANGIOGRAM N/A 02/26/2014   Procedure: LEFT HEART CATHETERIZATION WITH CORONARY ANGIOGRAM;  Surgeon: Jettie Booze, MD;  [name];  Service: Cardiovascular;  Laterality: N/A;   OVARIAN CYST REMOVAL Right 1999   PVC ABLATION N/A 12/01/2017   Procedure: PVC ABLATION;  Surgeon: Evans Lance, MD;  Location: [facility];  Service: Cardiovascular;  Laterality: N/A;   RIGHT HEART CATH N/A 08/05/2017   Procedure: RIGHT HEART CATH;  Surgeon: Jolaine Artist, MD;  Location: [facility];  Service: Cardiovascular;  Laterality: N/A;   RIGHT HEART CATH N/A 11/16/2019   Procedure: RIGHT HEART CATH;  Surgeon: Jolaine Artist, MD;  Location: [facility];  Service: Cardiovascular;  Laterality: N/A;   TUBAL LIGATION  1999   Social History   Socioeconomic History   Marital status: Married    Spouse name: Not on file   Number of children: 1   Years of education: Not on file   Highest education level: Not on file  Occupational History   Occupation: stay at home mom  Tobacco Use   Smoking status: Never   Smokeless tobacco: Never  Vaping Use   Vaping Use: Never used  Substance and Sexual Activity   Alcohol use: No   Drug use: No   Sexual activity: Yes  Other Topics Concern   Not on file  Social History Narrative   Not on file   Social Determinants of Health   Financial Resource Strain: Not on file  Food Insecurity: Not on file  Transportation Needs: Not on file  Physical Activity: Not on file  Stress: Not on file  Social Connections: Not on file   Allergies  Allergen Reactions   Vancomycin Other (See Comments)    "did something to my kidneys," PROGRESSED TO KIDNEY FAILURE!!   Aspirin Other (See Comments)    Wheezing, (Pt states that she just wheezes some when she takes aspirin by itself but she can take aspirin in a combination product).    Contrast Media [Iodinated Diagnostic Agents] Other (See Comments)    Multiple CT contrast studies done over 2 weeks caused ARF   Cyclobenzaprine Other (See Comments)    Can not tolerate   Ciprofloxacin Itching and Rash   Farxiga [Dapagliflozin] Rash   Sulfa Antibiotics Itching and Rash   Family History  Problem Relation Age of Onset   Emphysema Maternal Grandmother        smoked   Heart disease Maternal Grandmother 38       MI   Rheum arthritis Mother    Allergies Daughter    Colon cancer Neg Hx     Current Outpatient Medications (Endocrine & Metabolic):    empagliflozin (JARDIANCE) 10 MG TABS tablet, Take 1 tablet (10 mg total) by mouth  daily.  Current Outpatient Medications (Cardiovascular):    amiodarone (PACERONE) 200 MG tablet, Take 1 tablet (200 mg total) by mouth 2 (two) times daily.   ivabradine (CORLANOR) 7.5 MG TABS tablet, Take 1 tablet (7.5 mg total) by mouth 2 (two) times daily with a meal.   metoprolol succinate (TOPROL-XL) 50 MG 24 hr tablet, Take 1 tablet (50 mg total) by mouth daily. Take with or immediately following a meal.   nitroGLYCERIN (NITROSTAT) 0.4 MG SL tablet, Place 1 tablet (0.4 mg total) under the tongue every 5 (five) minutes as needed for chest pain.   sacubitril-valsartan (ENTRESTO) 49-51 MG, Take 1 tablet by mouth 2 (two) times daily.   spironolactone (ALDACTONE) 25 MG tablet, Take 0.5 tablets (12.5 mg total) by mouth at bedtime.   torsemide (DEMADEX) 20 MG tablet, Take 2 tablets (40 mg total) by mouth daily for 3 days, THEN 1 tablet (20 mg total) every other day.  Current Outpatient Medications (Respiratory):    albuterol (PROAIR HFA) 108 (90 Base) MCG/ACT inhaler, Inhale 1-2 puffs into the lungs every 6 (six) hours as needed for wheezing or shortness of breath.   budesonide-formoterol (SYMBICORT) 160-4.5 MCG/ACT inhaler, Inhale 2 puffs into the lungs 2 (two) times daily as needed.   fluticasone (FLONASE) 50 MCG/ACT nasal spray, Place 2 sprays into  both nostrils daily as needed for allergies.   ipratropium-albuterol (DUONEB) 0.5-2.5 (3) MG/3ML SOLN, Take 3 mLs by nebulization every 6 (six) hours as needed.  Current Outpatient Medications (Analgesics):    diclofenac (VOLTAREN) 75 MG EC tablet, Take 1 tablet (75 mg total) by mouth 2 (two) times daily as needed.  Current Outpatient Medications (Hematological):    cyanocobalamin (,VITAMIN B-12,) 1000 MCG/ML injection, Inject 1 mL (1,000 mcg total) into the muscle as directed. Once daily for 7 days, then once weekly for 4 weeks followed by once monthly  Current Outpatient Medications (Other):    diclofenac Sodium (VOLTAREN) 1 % GEL, Apply 4 g topically 4 (four) times daily.   dicyclomine (BENTYL) 20 MG tablet, Take 1 tablet (20 mg total) by mouth daily as needed for spasms.   gabapentin (NEURONTIN) 300 MG capsule, Take 1 capsule (300 mg total) by mouth at bedtime as needed (back spasms).   lidocaine (LIDODERM) 5 %, Place 1 patch onto the skin daily. Remove & Discard patch within 12 hours or as directed by MD   Magnesium Oxide 200 MG TABS, Take 1 tablet (200 mg total) by mouth daily.   Multiple Vitamins-Minerals (MULTIVITAMIN GUMMIES WOMENS PO), Take 2 tablets by mouth at bedtime.    omeprazole (PRILOSEC) 20 MG capsule, Take 1 capsule (20 mg total) by mouth daily as needed (for reflux symptoms).   potassium chloride SA (KLOR-CON) 20 MEQ tablet, Take 2 tablets (40 mEq total) by mouth daily for 30 days, THEN 1 tablet (20 mEq total) daily.   tiZANidine (ZANAFLEX) 4 MG tablet, Take 4 mg by mouth every 8 (eight) hours as needed for muscle spasms.    Vitamin D, Ergocalciferol, (DRISDOL) 1.25 MG (50000 UNIT) CAPS capsule, Take 1 capsule (50,000 Units total) by mouth every 7 (seven) days. (Patient taking differently: Take 50,000 Units by mouth every 7 (seven) days. Wednesday)   Reviewed prior external information including notes and imaging from  primary care provider As well as notes that were  available from care everywhere and other healthcare systems.  Past medical history, social, surgical and family history all reviewed in electronic medical record.  No pertanent information unless stated  regarding to the chief complaint.   Review of Systems:  No headache, visual changes, nausea, vomiting, diarrhea, constipation, dizziness, abdominal pain, skin rash, fevers, chills, night sweats, weight loss, swollen lymph nodes, body aches, joint swelling, chest pain, shortness of breath, mood changes. POSITIVE muscle aches  Objective  Blood pressure (!) 150/90, pulse 85, height '5\' 5"'$  (1.651 m), weight 223 lb (101.2 kg), SpO2 98 %.   General: No apparent distress alert and oriented x3 mood and affect normal, dressed appropriately.  HEENT: Pupils equal, extraocular movements intact  Respiratory: [name] speak in full sentences and does not appear short of breath  Cardiovascular: No lower extremity edema, non tender, no erythema  Gait normal with good balance and coordination.  MSK:  Non tender with full range of motion and good stability and symmetric strength and tone of shoulders, elbows, wrist, hip, knee and ankles bilaterally.  Low back exam does have some mild loss of lordosis.  Patient noted does have severe tenderness of the right greater trochanteric area.  Mild over the piriformis.  Patient does have mild positive FABER test.   Procedure: Real-time Ultrasound Guided Injection of right greater trochanteric bursitis secondary to patient's body habitus Device: GE Logiq Q7 Ultrasound guided injection is preferred based studies that show increased duration, increased effect, greater accuracy, decreased procedural pain, increased response rate, and decreased cost with ultrasound guided versus blind injection.  Verbal informed consent obtained.  Time-out conducted.  Noted no overlying erythema, induration, or other signs of local infection.  Skin prepped in a sterile fashion.  Local  anesthesia: Topical Ethyl chloride.  With sterile technique and under real time ultrasound guidance:  Greater trochanteric area was visualized and patient's bursa was noted. A 22-gauge 3 inch needle was inserted and 4 cc of 0.5% Marcaine and 1 cc of Kenalog 40 mg/dL was injected. Pictures taken Completed without difficulty  Pain immediately resolved suggesting accurate placement of the medication.  Advised to call if fevers/chills, erythema, induration, drainage, or persistent bleeding.  Impression: Technically successful ultrasound guided injection.   Impression and Recommendations:     The above documentation has been reviewed and is accurate and complete Lyndal Pulley, DO

## 2020-12-25 NOTE — Progress Notes (Signed)
EPIC Encounter for ICM Monitoring  Patient Name: [name] is a 45 y.o. female Date: 12/25/2020 Primary Care Physican: Hoyt Koch, MD Primary Cardiologist: Bensimhon Electrophysiologist: Caryl Comes 12/24/2020 Office Weight: 219 lbs                                                             Transmission reviewed.  Pt seen in HF clinic on 8/2 and recommendations given.    Heartlogic HF Index 27 suggesting possible fluid accumulation.  Barostim implant 6/27.   Prescribed: Torsemide 20 mg Take 1 tablet  (20 mg total) by mouth every other day.  Potassium 20 mEq take 1 tablet daily Spironolactone 25 mg take 0.5 tablet (12.5 mg total) daily.    Labs: 12/24/2020 Creatinine 0.80, BUN 7,   Potassium 3.6, Sodium 136, GFR >60 11/29/2020 Creatinine 0.77, BUN 5,   Potassium 3.1, Sodium 138, GFR >60  11/06/2020 Creatinine 0.78, BUN <5, Potassium 3.3, Sodium 137, GFR >60  10/16/2020 Creatinine 0.86, BUN 10, Potassium 3.7, Sodium 142, GFR >60  A complete set of results can be found in Results Review.   Recommendations:  Recommendations to increase torsemide to 40 mg daily x 3 days, then down to 20 my daily by Allena Katz, NP at [facility] on 8/2.   Follow-up plan: ICM clinic phone appointment on 12/31/2020 to recheck fluid levels   91 day device clinic remote transmission 03/18/2021.              EP/Cardiology next office visit:  12/30/2020 with Oda Kilts, [place].  01/14/2021 with Dr Haroldine Laws   Copy of ICM check sent to Dr. Caryl Comes.  3 Month Trend    8 Day Data Trend          [name], RN 12/25/2020 10:30 AM

## 2020-12-26 ENCOUNTER — Encounter: Payer: Self-pay | Admitting: Family Medicine

## 2020-12-26 ENCOUNTER — Other Ambulatory Visit: Payer: Self-pay

## 2020-12-26 ENCOUNTER — Ambulatory Visit: Payer: Self-pay

## 2020-12-26 ENCOUNTER — Ambulatory Visit: Payer: HMO | Admitting: Family Medicine

## 2020-12-26 VITALS — BP 150/90 | HR 85 | Ht 65.0 in | Wt 223.0 lb

## 2020-12-26 DIAGNOSIS — M25551 Pain in right hip: Secondary | ICD-10-CM | POA: Diagnosis not present

## 2020-12-26 DIAGNOSIS — M7061 Trochanteric bursitis, right hip: Secondary | ICD-10-CM | POA: Diagnosis not present

## 2020-12-26 NOTE — Assessment & Plan Note (Signed)
Patient given injection and tolerated the procedure well.  Discussed icing regimen and home exercises.  Discussed icing regimen.  Patient follow-up with me again 6 to 8 weeks after starting with home therapy.  Avoided HVLA or other manipulation secondary to patient's recent surgical intervention.

## 2020-12-26 NOTE — Patient Instructions (Addendum)
Good to see you  Injections given in R hip today  GT exercises given today  Ice is your friend may need in 6 hours Let us know when your recital is! See Me again in 6 weeks

## 2020-12-29 NOTE — Progress Notes (Signed)
Electrophysiology Office Note Date: 12/29/2020  ID:  [name], [name] [birth year], MRN [id number]  PCP: Hoyt Koch, MD Primary Cardiologist: Sinclair Grooms, MD Electrophysiologist: Cristopher Peru, MD   CC: Barostim titration   [name] is a 45 y.o. female seen today for barostim titration. she is doing well since implantation. At last visit, pt device titrated from 1.0 to 3.2. This visit occurred in the HF clinic as she had cancelled our appointment as she was already being seen.   The patients goals for barostim titration are to improve her fatigue. She really wants to be able to do more Dance/Tap lessons. Currently she can get through about a half a class a week. Her goal is to work up to tolerating 2 classes a week.    Device History: Barostim (standard) implanted 11/08/20 for Chronic systolic CHF Medtronic Single Chamber ICD implanted 2018 for NICM   Past Medical History:  Diagnosis Date   Acute on chronic systolic (congestive) heart failure ([place]) 04/29/2017   Acute pain of right shoulder 06/16/2017   AICD (automatic cardioverter/defibrillator) present    Anginal pain (HCC)    Anxiety    Arthritis    right shoulder    Asthma    CHF (congestive heart failure) ([place])    Chronic combined systolic and diastolic heart failure ([place]) 03/05/2014   Closed low lateral malleolus fracture 10/23/2013   Cystitis 10/21/2017   Depression    Depression with anxiety 01/20/2013   Diabetes mellitus without complication (HCC)    Diverticulosis    Dyspnea    comes and goes intermittently mostly with exertion    Dysrhythmia    Essential hypertension    Prev followed by [name]    Fibroid    age 37   Gallstones    Generalized abdominal cramping    History of cardiomyopathy    Hypertension    IBS (irritable bowel syndrome)    ICD (implantable cardioverter-defibrillator), single, 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   Insomnia 05/07/2017   Labral tear of shoulder  04/04/2015    Injected 04/04/2015 Injected 12/03/2015    Migraine    "monthly" (08/03/2016)   Myofascial pain 06/16/2017   NICM (nonischemic cardiomyopathy) ([place]) 08/03/2016   Nonallopathic lesion of lumbosacral region 11/16/2016   Nonallopathic lesion of sacral region 11/16/2016   Nonallopathic lesion of thoracic region 08/20/2014   Nonspecific chest pain 04/28/2017   OSA (obstructive sleep apnea) 01/02/2013   NPSG 2009:  AHI 9/hr. CPAP intolerance >> "smothering" Good tolerance of auto device (optimal pressure 12-13 on download).  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   PVC (premature ventricular contraction) 06/23/2016   Seizures ([place])    "as a child" (08/03/2016)   Termination of pregnancy    due to cardiac risk   Past Surgical History:  Procedure Laterality Date   CARDIAC CATHETERIZATION N/A 11/11/2015   Procedure: Right/Left Heart Cath and Coronary Angiography;  Surgeon: Troy Sine, MD;  Location: [facility];  Service: Cardiovascular;  Laterality: N/A;   CARDIAC CATHETERIZATION  ~ 2015   CARDIAC DEFIBRILLATOR PLACEMENT  08/03/2016   CESAREAN SECTION  1999   COLONOSCOPY WITH PROPOFOL N/A 04/21/2016   Procedure: COLONOSCOPY WITH PROPOFOL;  Surgeon: Jerene Bears, MD;  Location: [facility];  Service: Gastroenterology;  Laterality: N/A;   ESOPHAGOGASTRODUODENOSCOPY (EGD) WITH PROPOFOL N/A 04/21/2016   Procedure: ESOPHAGOGASTRODUODENOSCOPY (EGD) WITH PROPOFOL;  Surgeon: Jerene Bears, MD;  Location: [facility];  Service: Gastroenterology;  Laterality: N/A;   FOOT FRACTURE SURGERY Right ~ 2003   FRACTURE SURGERY     ICD IMPLANT N/A 08/03/2016   Procedure: ICD Implant;  Surgeon: Deboraha Sprang, MD;  Location: [facility];  Service: Cardiovascular;  Laterality: N/A;   LAPAROSCOPIC CHOLECYSTECTOMY  12/2006    LAPAROSCOPIC GASTRIC SLEEVE RESECTION     LAPAROSCOPY ABDOMEN DIAGNOSTIC  2008   "cut bile duct w/gallbladder OR; had to go in later & fix leak; hospitalized for 2 months"   LEFT HEART CATHETERIZATION WITH CORONARY ANGIOGRAM N/A 02/26/2014   Procedure: LEFT HEART CATHETERIZATION WITH CORONARY ANGIOGRAM;  Surgeon: Jettie Booze, MD;  Location: [facility];  Service: Cardiovascular;  Laterality: N/A;   OVARIAN CYST REMOVAL Right 1999   PVC ABLATION N/A 12/01/2017   Procedure: PVC ABLATION;  Surgeon: Evans Lance, MD;  Location: [place];  Service: Cardiovascular;  Laterality: N/A;   RIGHT HEART CATH N/A 08/05/2017   Procedure: RIGHT HEART CATH;  Surgeon: Jolaine Artist, MD;  Location: [place] [facility];  Service: Cardiovascular;  Laterality: N/A;   RIGHT HEART CATH N/A 11/16/2019   Procedure: RIGHT HEART CATH;  Surgeon: Jolaine Artist, MD;  Location: [place] [facility];  Service: Cardiovascular;  Laterality: N/A;   TUBAL LIGATION  1999    Current Outpatient Medications  Medication Sig Dispense Refill   albuterol (PROAIR HFA) 108 (90 Base) MCG/ACT inhaler Inhale 1-2 puffs into the lungs every 6 (six) hours as needed for wheezing or shortness of breath. 18 g 5   amiodarone (PACERONE) 200 MG tablet Take 1 tablet (200 mg total) by mouth 2 (two) times daily. 60 tablet 2   budesonide-formoterol (SYMBICORT) 160-4.5 MCG/ACT inhaler Inhale 2 puffs into the lungs 2 (two) times daily as needed. 30.6 each 3   cyanocobalamin (,VITAMIN B-12,) 1000 MCG/ML injection Inject 1 mL (1,000 mcg total) into the muscle as directed. Once daily for 7 days, then once weekly for 4 weeks followed by once monthly 11 mL 1   diclofenac (VOLTAREN) 75 MG EC tablet Take 1 tablet (75 mg total) by mouth 2 (two) times daily as needed. 30 tablet 0   diclofenac Sodium (VOLTAREN) 1 % GEL Apply 4 g topically 4 (four) times daily. 300 g 6   dicyclomine (BENTYL) 20 MG tablet Take 1 tablet (20 mg total) by mouth  daily as needed for spasms. 90 tablet 3   empagliflozin (JARDIANCE) 10 MG TABS tablet Take 1 tablet (10 mg total) by mouth daily. 30 tablet 3   fluticasone (FLONASE) 50 MCG/ACT nasal spray Place 2 sprays into both nostrils daily as needed for allergies. 48 g 0   gabapentin (NEURONTIN) 300 MG capsule Take 1 capsule (300 mg total) by mouth at bedtime as needed (back spasms). 90 capsule 3   ipratropium-albuterol (DUONEB) 0.5-2.5 (3) MG/3ML SOLN Take 3 mLs by nebulization every 6 (six) hours as needed. 360 mL 0   ivabradine (CORLANOR) 7.5 MG TABS tablet Take 1 tablet (7.5 mg total) by mouth 2 (two) times daily with a meal. 60 tablet 2   lidocaine (LIDODERM) 5 % Place 1 patch onto the skin daily. Remove & Discard patch within 12 hours or as directed by MD 90 patch 3   Magnesium Oxide 200  MG TABS Take 1 tablet (200 mg total) by mouth daily. 90 tablet 3   metoprolol succinate (TOPROL-XL) 50 MG 24 hr tablet Take 1 tablet (50 mg total) by mouth daily. Take with or immediately following a meal. 30 tablet 3   Multiple Vitamins-Minerals (MULTIVITAMIN GUMMIES WOMENS PO) Take 2 tablets by mouth at bedtime.      nitroGLYCERIN (NITROSTAT) 0.4 MG SL tablet Place 1 tablet (0.4 mg total) under the tongue every 5 (five) minutes as needed for chest pain. 75 tablet 0   omeprazole (PRILOSEC) 20 MG capsule Take 1 capsule (20 mg total) by mouth daily as needed (for reflux symptoms). 30 capsule 0   potassium chloride SA (KLOR-CON) 20 MEQ tablet Take 2 tablets (40 mEq total) by mouth daily for 30 days, THEN 1 tablet (20 mEq total) daily. 90 tablet 3   sacubitril-valsartan (ENTRESTO) 49-51 MG Take 1 tablet by mouth 2 (two) times daily. 60 tablet 6   spironolactone (ALDACTONE) 25 MG tablet Take 0.5 tablets (12.5 mg total) by mouth at bedtime. 90 tablet 3   tiZANidine (ZANAFLEX) 4 MG tablet Take 4 mg by mouth every 8 (eight) hours as needed for muscle spasms.      torsemide (DEMADEX) 20 MG tablet Take 2 tablets (40 mg total) by  mouth daily for 3 days, THEN 1 tablet (20 mg total) every other day. 36 tablet 3   Vitamin D, Ergocalciferol, (DRISDOL) 1.25 MG (50000 UNIT) CAPS capsule Take 1 capsule (50,000 Units total) by mouth every 7 (seven) days. (Patient taking differently: Take 50,000 Units by mouth every 7 (seven) days. Wednesday) 12 capsule 0   No current facility-administered medications for this visit.    Allergies:   Vancomycin, Aspirin, Contrast media [iodinated diagnostic agents], Cyclobenzaprine, Ciprofloxacin, Farxiga [dapagliflozin], and Sulfa antibiotics   Social History: Social History   Socioeconomic History   Marital status: Married    Spouse name: Not on file   Number of children: 1   Years of education: Not on file   Highest education level: Not on file  Occupational History   Occupation: stay at home mom  Tobacco Use   Smoking status: Never   Smokeless tobacco: Never  Vaping Use   Vaping Use: Never used  Substance and Sexual Activity   Alcohol use: No   Drug use: No   Sexual activity: Yes  Other Topics Concern   Not on file  Social History Narrative   Not on file   Social Determinants of Health   Financial Resource Strain: Not on file  Food Insecurity: Not on file  Transportation Needs: Not on file  Physical Activity: Not on file  Stress: Not on file  Social Connections: Not on file  Intimate Partner Violence: Not on file    Family History: Family History  Problem Relation Age of Onset   Emphysema Maternal Grandmother        smoked   Heart disease Maternal Grandmother 15       MI   Rheum arthritis Mother    Allergies Daughter    Colon cancer Neg Hx      Review of Systems: All other systems reviewed and are otherwise negative except as noted above.  Physical Exam: There were no vitals filed for this visit.   GEN- The patient is well appearing, alert and oriented x 3 today.   HEENT: normocephalic, atraumatic; sclera clear, conjunctiva pink; hearing intact;  oropharynx clear; neck supple  Lungs- Clear to ausculation bilaterally, normal work of  breathing.  No wheezes, rales, rhonchi Heart- Regular rate and rhythm, no murmurs, rubs or gallops  GI- soft, non-tender, non-distended, bowel sounds present  Extremities- no clubbing, cyanosis, or edema  MS- no significant deformity or atrophy Skin- warm and dry, no rash or lesion; PPM pocket well healed Psych- euthymic mood, full affect Neuro- strength and sensation are intact  Barostim Interrogation- Performed personally and reviewed in detail today,  See scanned report  EKG:  EKG is not ordered today.  Recent Labs: 08/25/2020: B Natriuretic Peptide 301.6 08/26/2020: Magnesium 2.1; TSH 1.732 10/03/2020: NT-Pro BNP 913 11/06/2020: ALT 13; Hemoglobin 13.3; Platelets 198 12/24/2020: BUN 7; Creatinine, Ser 0.80; Potassium 3.9; Sodium 136   Wt Readings from Last 3 Encounters:  12/26/20 223 lb (101.2 kg)  12/24/20 222 lb (100.7 kg)  11/29/20 224 lb 12.8 oz (102 kg)     Other studies Reviewed: Additional studies/ records that were reviewed today include: Previous EP and HF nots  Assessment and Plan:  1. Chronic systolic CHF s/p [employer] and Barostim implantation NYHA III symptoms. Mostly fatigue  Device titrated from 3.2 millamp to 4.0 milliamp by increments of 0.4 with stable BP. Pt had stim felt as a "fullness" at 4.4.  Device impedence stable. Pt goals are to improve fatigue, take tap lessons/dance class 2x a week. [id number] to be filled out.   Normal device function See scanned report. Will follow up in 3-4 weeks to continue titration with goal of 6-8 milliamps for chronic settings.   2. Frequent PVCs Not candidate for re-do ablation due to location of her PVCs Optimize GDMT and BB as tolerated Remains on amiodarone 200 mg BID for the time being   Current medicines are reviewed at length with the patient today.   The patient does not have concerns regarding her medicines.  The following  changes were made today:  none  Labs/ tests ordered today include:  No orders of the defined types were placed in this encounter.  Disposition:   Follow up with EP APP in 4 Weeks for further barostim titration.  Jacalyn Lefevre, PA-C  12/29/2020 9:39 PM  [facility]  [place] [address] [phone] (office) [phone] (fax)

## 2020-12-30 ENCOUNTER — Other Ambulatory Visit: Payer: Self-pay

## 2020-12-30 ENCOUNTER — Ambulatory Visit (INDEPENDENT_AMBULATORY_CARE_PROVIDER_SITE_OTHER): Payer: HMO | Admitting: Student

## 2020-12-30 ENCOUNTER — Encounter: Payer: Self-pay | Admitting: Student

## 2020-12-30 VITALS — Ht 65.0 in | Wt 220.0 lb

## 2020-12-30 DIAGNOSIS — I493 Ventricular premature depolarization: Secondary | ICD-10-CM

## 2020-12-30 DIAGNOSIS — R5383 Other fatigue: Secondary | ICD-10-CM | POA: Diagnosis not present

## 2020-12-30 DIAGNOSIS — I5022 Chronic systolic (congestive) heart failure: Secondary | ICD-10-CM

## 2020-12-30 DIAGNOSIS — Z95810 Presence of automatic (implantable) cardiac defibrillator: Secondary | ICD-10-CM

## 2020-12-30 NOTE — Patient Instructions (Signed)
Medication Instructions:  Your physician recommends that you continue on your current medications as directed. Please refer to the Current Medication list given to you today.  *If you need a refill on your cardiac medications before your next appointment, please call your pharmacy*   Lab Work: None If you have labs (blood work) drawn today and your tests are completely normal, you will receive your results only by: [place] (if you have MyChart) OR A paper copy in the mail If you have any lab test that is abnormal or we need to change your treatment, we will call you to review the results.   Follow-Up: At [facility], you and your health needs are our priority.  As part of our continuing mission to provide you with exceptional heart care, we have created designated Provider Care Teams.  These Care Teams include your primary Cardiologist (physician) and Advanced Practice Providers (APPs -  Physician Assistants and Nurse Practitioners) who all work together to provide you with the care you need, when you need it.

## 2020-12-31 ENCOUNTER — Ambulatory Visit (INDEPENDENT_AMBULATORY_CARE_PROVIDER_SITE_OTHER): Payer: HMO

## 2020-12-31 DIAGNOSIS — I5022 Chronic systolic (congestive) heart failure: Secondary | ICD-10-CM

## 2020-12-31 DIAGNOSIS — Z95810 Presence of automatic (implantable) cardiac defibrillator: Secondary | ICD-10-CM

## 2021-01-01 ENCOUNTER — Telehealth: Payer: Self-pay

## 2021-01-01 NOTE — Progress Notes (Signed)
EPIC Encounter for ICM Monitoring  Patient Name: [name] is a 45 y.o. female Date: 01/01/2021 Primary Care Physican: Hoyt Koch, MD Primary Cardiologist: Bensimhon Electrophysiologist: Caryl Comes 12/24/2020 Office Weight: 219 lbs                                                             Attempted call to patient and unable to reach.  Pt is difficult to reach by phone. Transmission reviewed.  Given instructions at 8/2 OV with HF clinic to increase Furosemide to 40 mg daily x 3 days with 40 mEq Potassium x 3 days and then return to prescribed dosage.    Heartlogic HF Index remains elevated at 22 suggesting possible fluid accumulation.  Thoracic impedance trending up.  Barostim implant 6/27.   Prescribed: Torsemide 20 mg Take 1 tablet  (20 mg total) by mouth every other day.  Potassium 20 mEq take 1 tablet daily Spironolactone 25 mg take 0.5 tablet (12.5 mg total) daily.    Labs: 12/24/2020 Creatinine 0.80, BUN 7,   Potassium 3.6, Sodium 136, GFR >60 11/29/2020 Creatinine 0.77, BUN 5,   Potassium 3.1, Sodium 138, GFR >60 11/06/2020 Creatinine 0.78, BUN <5, Potassium 3.3, Sodium 137, GFR >60 10/16/2020 Creatinine 0.86, BUN 10, Potassium 3.7, Sodium 142, GFR >60  A complete set of results can be found in Results Review.   Recommendations:  Unable to reach.     Follow-up plan: ICM clinic phone appointment on 01/28/2021 (recheck fluid levels at 8/23 OV with HF Clinic).   91 day device clinic remote transmission 03/18/2021.              EP/Cardiology next office visit:   01/14/2021 with Dr Haroldine Laws.   01/21/2021 with Oda Kilts, PA for Barostim adjustment.   Copy of ICM check sent to Dr. Caryl Comes.  3 Month Trend    8 Day Data Trend          [name], RN 01/01/2021 7:41 AM

## 2021-01-01 NOTE — Telephone Encounter (Signed)
Remote ICM transmission received.  Attempted call to patient regarding ICM remote transmission and no voice mail box set up.  

## 2021-01-08 ENCOUNTER — Other Ambulatory Visit: Payer: Self-pay

## 2021-01-13 NOTE — Progress Notes (Signed)
Remote ICD transmission.   

## 2021-01-14 ENCOUNTER — Encounter (HOSPITAL_COMMUNITY): Payer: Self-pay | Admitting: Internal Medicine

## 2021-01-14 ENCOUNTER — Other Ambulatory Visit: Payer: Self-pay

## 2021-01-14 ENCOUNTER — Ambulatory Visit (HOSPITAL_COMMUNITY)
Admission: RE | Admit: 2021-01-14 | Discharge: 2021-01-14 | Disposition: A | Discharge status: Home / Self Care | Payer: HMO | Source: Ambulatory Visit | Attending: Internal Medicine | Admitting: Internal Medicine

## 2021-01-14 ENCOUNTER — Other Ambulatory Visit (HOSPITAL_COMMUNITY): Payer: Self-pay | Admitting: Internal Medicine

## 2021-01-14 VITALS — BP 124/80 | HR 72 | Wt 219.0 lb

## 2021-01-14 DIAGNOSIS — Z79899 Other long term (current) drug therapy: Secondary | ICD-10-CM | POA: Insufficient documentation

## 2021-01-14 DIAGNOSIS — Z7984 Long term (current) use of oral hypoglycemic drugs: Secondary | ICD-10-CM | POA: Diagnosis not present

## 2021-01-14 DIAGNOSIS — Z8249 Family history of ischemic heart disease and other diseases of the circulatory system: Secondary | ICD-10-CM | POA: Insufficient documentation

## 2021-01-14 DIAGNOSIS — Z882 Allergy status to sulfonamides status: Secondary | ICD-10-CM | POA: Insufficient documentation

## 2021-01-14 DIAGNOSIS — Z7951 Long term (current) use of inhaled steroids: Secondary | ICD-10-CM | POA: Diagnosis not present

## 2021-01-14 DIAGNOSIS — Z9884 Bariatric surgery status: Secondary | ICD-10-CM | POA: Diagnosis not present

## 2021-01-14 DIAGNOSIS — G4733 Obstructive sleep apnea (adult) (pediatric): Secondary | ICD-10-CM | POA: Diagnosis not present

## 2021-01-14 DIAGNOSIS — I110 Hypertensive heart disease with heart failure: Secondary | ICD-10-CM | POA: Diagnosis not present

## 2021-01-14 DIAGNOSIS — E119 Type 2 diabetes mellitus without complications: Secondary | ICD-10-CM | POA: Diagnosis not present

## 2021-01-14 DIAGNOSIS — Z886 Allergy status to analgesic agent status: Secondary | ICD-10-CM | POA: Diagnosis not present

## 2021-01-14 DIAGNOSIS — R008 Other abnormalities of heart beat: Secondary | ICD-10-CM | POA: Insufficient documentation

## 2021-01-14 DIAGNOSIS — Z881 Allergy status to other antibiotic agents status: Secondary | ICD-10-CM | POA: Insufficient documentation

## 2021-01-14 DIAGNOSIS — I5042 Chronic combined systolic (congestive) and diastolic (congestive) heart failure: Secondary | ICD-10-CM | POA: Insufficient documentation

## 2021-01-14 DIAGNOSIS — I493 Ventricular premature depolarization: Secondary | ICD-10-CM

## 2021-01-14 DIAGNOSIS — Z6836 Body mass index (BMI) 36.0-36.9, adult: Secondary | ICD-10-CM | POA: Insufficient documentation

## 2021-01-14 DIAGNOSIS — Z95810 Presence of automatic (implantable) cardiac defibrillator: Secondary | ICD-10-CM | POA: Diagnosis not present

## 2021-01-14 MED ORDER — SPIRONOLACTONE 25 MG PO TABS: 25.0000 mg | ORAL_TABLET | Freq: Every day | ORAL | 3 refills | Status: DC

## 2021-01-14 NOTE — Patient Instructions (Addendum)
Increase Spironolactone to 25 mg (1 tab) Daily at bedtime  Your physician recommends that you return for lab work in: 1 week, this can be done at [facility] 01/21/21  Your provider has recommended that  you wear a Zio Patch for 14 days.  This monitor will record your heart rhythm for our review.  IF you have any symptoms while wearing the monitor please press the button.  If you have any issues with the patch or you notice a red or orange light on it please call the company at [phone].  Once you remove the patch please mail it back to the company as soon as possible so we can get the results.  You have been referred to the PREP Program at the [facility], they will call you to schedule this  Your physician recommends that you schedule a follow-up appointment in: 3 months with echocardiogram  If you have any questions or concerns before your next appointment please send [place] a message through [place] or call our office at [phone].    TO LEAVE A MESSAGE FOR THE NURSE SELECT OPTION 2, PLEASE LEAVE A MESSAGE INCLUDING: YOUR NAME DATE OF BIRTH CALL BACK NUMBER REASON FOR CALL**this is important as we prioritize the call backs  YOU WILL RECEIVE A CALL BACK THE SAME DAY AS LONG AS YOU CALL BEFORE 4:00 PM  If you have any questions or concerns before your next appointment please send [place] a message through [place] or call our office at [phone].    TO LEAVE A MESSAGE FOR THE NURSE SELECT OPTION 2, PLEASE LEAVE A MESSAGE INCLUDING: YOUR NAME DATE OF BIRTH CALL BACK NUMBER REASON FOR CALL**this is important as we prioritize the call backs  YOU WILL RECEIVE A CALL BACK THE SAME DAY AS LONG AS YOU CALL BEFORE 4:00 PM

## 2021-01-14 NOTE — Progress Notes (Signed)
Advanced Heart Failure Clinic Note   Referring Physician: PCP: Hoyt Koch, MD PCP-Cardiologist: Sinclair Grooms, MD  [facility]: Dr. Haroldine Laws   Reason for Visit: F/u for Chronic Systolic Heart Failure  HPI: [name] is a 45 y.o. 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   Admitted 7/19 with CP Symptoms thought to be related to frequent PVCs. EP consulted and scheduled her for PVC ablation. Echo repeated 11/22/17 and showed improved EF 50-55%.    Underwent PVC ablation 12/01/17. Was felt not to be successful.  Was on flecainide but discontinued due to reduced EF. Now on amiodarone.    Echo 10/2018 showed drop in EF back down to 25-30%. Was instructed to f/u in clinic but pt did not. She had outpatient monitor 04/2018 that showed rare PVCs.    Admitted 6/21 with chest pain and shortness of breath. Viral panel negative. ECHO EF < 20% and normal RV. Had cath with preserved cardiac output. Plan was to f/u with EP regarding PVCs. She has remained on AAD therapy w/ amiodarone + ? blocker therapy.  Admitted to [facility] 4/22 w/ increased dyspnea and volume overload. Also w/ increased PVC burden. Echo showed EF 20-25%, RV normal. She was diuresed w/ IV Lasix and placed on amiodarone gtt for PVC suppression. Seen by Dr. Lovena Le, due to the location of her PVCs, she is not a candidate for re-do ablation. Could consider referral to tertiary center, but success rate is not any more likely.   S/p Barostim 6/22.  Today she returns for HF follow up.Overall feeling fine. Has good days an bad days. Feels her heart racing 4 times a week. Does not feel as tired. SOB when she is carrying items up steps. Denies PND/Orthopnea. Appetite  ok. No fever or chills. Weight at home has been stable. Taking all medications.  [employer] Score: 0. Activity 0.4. No A fib.      Cardiac Studies  - Cath (6/17): revealed normal vessels, moderate elevation in pulmonary artery pressures, and LVEF 25-30%.   - RHC (6/21):   RA = 4 RV = 32/6 PA = 31/8 (21) PCW = 12 Fick cardiac output/index = 7.0/3.4 Thermo CO/CI = 5.2/2.5 PVR =1.7 (Thermo) Ao sat = 98% PA sat = 67%, 69%   - RHC (3/19): RA = 2 RV = 26/7 PA = 28/13 (19) PCW = 5 Fick cardiac output/index = 5.7/2.8 PVR = 2.5 WU Ao sat = 99% PA sat = 71%, 72% High SVC sat = 65%   - RHC (6/17): RA 5 RV 35/6 PA 41/12 PW 11 Fick 6.4/3.0 PA sat 62%  - Echo 6/15 35-40% - Echo 6/17 25-30% - Echo 12/17 20-25% - Echo 8/18 EF 25% - Echo 7/19: EF 50-55% - Echo 6/20: EF 25-30% - Echo 11/13/19: Ef < 20% RV normal  - Echo 4/22: EF 20-25%, RV normal    - CPX (2/18): FVC 2.57 (79%)      FEV1 2.14 (81%)        FEV1/FVC 83 (101%)        MVV 107 (102%) Resting HR: 106 Peak HR: 166   (93% age predicted max HR) BP rest: 136/98 BP peak: 174/88 Peak VO2: 17.1 (85% predicted peak VO2) -  corrects to 28.4 for ibw VE/VCO2 slope:  33 OUES: 2.16 Peak RER: 1.09 Ventilatory Threshold: 14.6 (73% predicted or measured peak VO2) VE/MVV:  59% PETCO2 at peak:  33 O2pulse:  10   (83% predicted O2pulse)   - CPX (12/18): pVO2 14.0 (corrects to 23.0 for IBW) VeVCO2  32 RER 1.0  - PFTs w/ DLOC (4/22) Mild Restriction Normal Diffusion   Review of systems complete and found to be negative unless listed in HPI.    Past Medical History:  Diagnosis Date   Acute on chronic systolic (congestive) heart failure (HCC) 04/29/2017   Acute pain of right shoulder 06/16/2017   AICD (automatic cardioverter/defibrillator) present    Anginal pain (HCC)    Anxiety    Arthritis    right shoulder    Asthma    CHF (congestive heart failure) ([place])    Chronic combined systolic and diastolic heart  failure ([place]) 03/05/2014   Closed low lateral malleolus fracture 10/23/2013   Cystitis 10/21/2017   Depression    Depression with anxiety 01/20/2013   Diabetes mellitus without complication (HCC)    Diverticulosis    Dyspnea    comes and goes intermittently mostly with exertion    Dysrhythmia    Essential hypertension    Prev followed by [name]    Fibroid    age 51   Gallstones    Generalized abdominal cramping    History of cardiomyopathy    Hypertension    IBS (irritable bowel syndrome)    ICD (implantable cardioverter-defibrillator), single, 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   Insomnia 05/07/2017   Labral tear of shoulder 04/04/2015    Injected 04/04/2015 Injected 12/03/2015    Migraine    "monthly" (08/03/2016)   Myofascial pain 06/16/2017   NICM (nonischemic cardiomyopathy) ([place]) 08/03/2016   Nonallopathic lesion of lumbosacral region 11/16/2016   Nonallopathic lesion of sacral region 11/16/2016   Nonallopathic lesion of thoracic region 08/20/2014   Nonspecific chest pain 04/28/2017   OSA (obstructive sleep apnea) 01/02/2013   NPSG 2009:  AHI 9/hr. CPAP intolerance >> "smothering" Good tolerance of auto device (optimal pressure 12-13 on download).  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 18 g 5   amiodarone (PACERONE) 200 MG tablet Take 1 tablet (200 mg total) by mouth 2 (two) times daily. 60 tablet 2   budesonide-formoterol (SYMBICORT) 160-4.5  MCG/ACT inhaler Inhale 2 puffs into the lungs 2 (two) times daily as needed. 30.6 each 3   diclofenac (VOLTAREN) 75 MG EC tablet Take 1 tablet (75 mg total) by mouth 2 (two) times daily as needed. 30 tablet 0   diclofenac Sodium (VOLTAREN) 1 % GEL Apply 4 g topically 4 (four) times daily. 300 g 6   dicyclomine (BENTYL) 20 MG tablet Take 1 tablet (20 mg total) by mouth daily as needed for spasms. 90 tablet 3   empagliflozin (JARDIANCE) 10 MG TABS  tablet Take 1 tablet (10 mg total) by mouth daily. 30 tablet 3   fluticasone (FLONASE) 50 MCG/ACT nasal spray Place 2 sprays into both nostrils daily as needed for allergies. 48 g 0   gabapentin (NEURONTIN) 300 MG capsule Take 1 capsule (300 mg total) by mouth at bedtime as needed (back spasms). 90 capsule 3   ipratropium-albuterol (DUONEB) 0.5-2.5 (3) MG/3ML SOLN Take 3 mLs by nebulization every 6 (six) hours as needed. 360 mL 0   ivabradine (CORLANOR) 7.5 MG TABS tablet Take 1 tablet (7.5 mg total) by mouth 2 (two) times daily with a meal. 60 tablet 2   lidocaine (LIDODERM) 5 % Place 1 patch onto the skin daily. Remove & Discard patch within 12 hours or as directed by MD 90 patch 3   Magnesium Oxide 200 MG TABS Take 1 tablet (200 mg total) by mouth daily. 90 tablet 3   metoprolol succinate (TOPROL-XL) 50 MG 24 hr tablet Take 1 tablet (50 mg total) by mouth daily. Take with or immediately following a meal. 30 tablet 3   Multiple Vitamins-Minerals (MULTIVITAMIN GUMMIES WOMENS PO) Take 2 tablets by mouth at bedtime.      nitroGLYCERIN (NITROSTAT) 0.4 MG SL tablet Place 1 tablet (0.4 mg total) under the tongue every 5 (five) minutes as needed for chest pain. 75 tablet 0   omeprazole (PRILOSEC) 20 MG capsule Take 1 capsule (20 mg total) by mouth daily as needed (for reflux symptoms). 30 capsule 0   POTASSIUM CHLORIDE PO Take 20 mEq by mouth daily in the afternoon.     sacubitril-valsartan (ENTRESTO) 49-51 MG Take 1 tablet by mouth 2 (two) times daily. 60 tablet  6   tiZANidine (ZANAFLEX) 4 MG tablet Take 4 mg by mouth every 8 (eight) hours as needed for muscle spasms.      torsemide (DEMADEX) 20 MG tablet Take 20 mg by mouth daily.     Vitamin D, Ergocalciferol, (DRISDOL) 1.25 MG (50000 UNIT) CAPS capsule Take 1 capsule (50,000 Units total) by mouth every 7 (seven) days. 12 capsule 0   spironolactone (ALDACTONE) 25 MG tablet Take 1 tablet (25 mg total) by mouth at bedtime. 90 tablet 3   No current facility-administered medications for this encounter.   Allergies  Allergen Reactions   Vancomycin Other (See Comments)    "did something to my kidneys," PROGRESSED TO KIDNEY FAILURE!!   Aspirin Other (See Comments)    Wheezing, (Pt states that she just wheezes some when she takes aspirin by itself but she can take aspirin in a combination product).   Contrast Media [Iodinated Diagnostic Agents] Other (See Comments)    Multiple CT contrast studies done over 2 weeks caused ARF   Cyclobenzaprine Other (See Comments)    Can not tolerate   Ciprofloxacin Itching and Rash   Farxiga [Dapagliflozin] Rash   Sulfa Antibiotics Itching and Rash   Social History   Socioeconomic History   Marital status: Married    Spouse name: Not on file   Number of children: 1   Years of education: Not on file   Highest education level: Not on file  Occupational History   Occupation: stay at home mom  Tobacco Use   Smoking status: Never   Smokeless tobacco: Never  Vaping Use   Vaping Use: Never used  Substance and Sexual Activity   Alcohol use: No   Drug use: No   Sexual activity: Yes  Other Topics Concern   Not on file  Social History  Narrative   Not on file   Social Determinants of Health   Financial Resource Strain: Not on file  Food Insecurity: Not on file  Transportation Needs: Not on file  Physical Activity: Not on file  Stress: Not on file  Social Connections: Not on file  Intimate Partner Violence: Not on file   Family History  Problem Relation  Age of Onset   Emphysema Maternal Grandmother        smoked   Heart disease Maternal Grandmother 35       MI   Rheum arthritis Mother    Allergies Daughter    Colon cancer Neg Hx    BP 124/80   Pulse 72   Wt 99.3 kg (219 lb)   SpO2 98%   BMI 36.44 kg/m   Wt Readings from Last 3 Encounters:  01/14/21 99.3 kg (219 lb)  12/30/20 99.8 kg (220 lb)  12/26/20 101.2 kg (223 lb)   PHYSICAL EXAM: General:  Well appearing. No resp difficulty HEENT: normal Neck: supple. no JVD. Carotids 2+ bilat; no bruits. No lymphadenopathy or thryomegaly appreciated. Cor: PMI nondisplaced. Regular rate & rhythm. No rubs, gallops or murmurs. Lungs: clear Abdomen: soft, nontender, nondistended. No hepatosplenomegaly. No bruits or masses. Good bowel sounds. Extremities: no cyanosis, clubbing, rash, edema Neuro: alert & orientedx3, cranial nerves grossly intact. moves all 4 extremities w/o difficulty. Affect pleasant  ASSESSMENT & PLAN:  1. Chronic systolic HF:  due to peri-partum CM, onset 1999. S/P [employer] ICD 07/2016 - LHC 6/17 No CAD - EF 20-25% (12/17).  - Echo 8/18 EF 25-30% s/p ICD 3/18. [place] 3/19: RA 2, PA 28/13, Fick CO 5.7, CI 2.8 - CPX 12/18 showed moderate limitation due mostly to obesity with some HF component - Echo 11/22/17 LVEF 50-55%, Grade 1 DD, Mild MR, Mod LAE - Echo 04/2018 EF 20-25%. RV ok  - Echo 10/2019 EF < 20%. - Echo 08/2020 EF 20-25%, RV normal  Has Barostim 10/2020   - NYHA Class II. Volume status elevated. HL Score 0  - Continue Entresto 49-51 mg twice a day.  - Continue torsemide 20 mg daily .  - Continue Jardiance 10 mg  - Increase spiro to 25 mg daily at bed time. Check BMEt in 7 days.  - Continue Toprol XL 50 mg daily    - Continue corlanor 7.'5mg'$  BID for now.  - Not candidate for CRT-D upgrade due to narrow QRS. - If no improvement post BaroStim placement, will consider repeat CPX testing>>RHC  2. Frequent PVCs/ Bigeminy - Holter Monitor 11/09/17 with 30%  PVCs with one primary morphology.  - S/p PVC ablation 11/2017 with Dr Lovena Le unsuccessful - Off flecainide with EF down and persistent PVCs. Changed to amio - Zio Patch 04/2019 > 17% PVCs and >8% couplets.  - Zio Patch 11/28/2019 8.5 % PVCs - Amio recently increased back to 200 mg bid given increased burden.  - Per Dr. Lovena Le, due to the location of her PVCs, she is not a candidate for re-do ablation. Could consider referral to tertiary center, but success rate is not any more likely.  - c/w metoprolol.   3. Chronic Amiodarone Use  - There is concern about her young age and long term use of amiodarone. This appears to be her only good option for PVC suppression.  -  Will need yearly PFTs w/ DLOC for surveillance. Recent study 4/22 showed mild restriction w/ normal diffusion. Repeat again 4/23.   - Recent TSH and HFTs  were ok.  - She gets annual eye exams.  4. Morbid obesity - s/p Gastric Sleeve at [facility] 09/20/17  - Body mass index is 36.44 kg/m.    5. Daytime Fatigue - Sleep study in 2019 and again in 2021 were both negative for sleep apnea  - Reports compliance w/ Vit D supp + Fe supp  - Suspect due to chronic HF/ PVCs   6. DM2 - Well controlled, recent Hgb A1c 5.5. - Now on Jardiance for concomitant HF   - Increase spironolactone 25 mg daily. Check BMET  - Refer to PREP   Darrick Grinder, NP 01/14/21  Patient seen and examined with the above-signed Advanced Practice Provider and/or Housestaff. I personally reviewed laboratory data, imaging studies and relevant notes. I independently examined the patient and formulated the important aspects of the plan. I have edited the note to reflect any of my changes or salient points. I have personally discussed the plan with the patient and/or family.  Feeling better after Barostim implant. NYHA II-III. Volume status looks good.   General:  Well appearing. No resp difficulty HEENT: normal Neck: supple. no JVD. Carotids 2+ bilat; no bruits.  No lymphadenopathy or thryomegaly appreciated. Cor: PMI nondisplaced. Regular rate & rhythm. + ectopy No rubs, gallops or murmurs. Lungs: clear Abdomen: obese soft, nontender, nondistended. No hepatosplenomegaly. No bruits or masses. Good bowel sounds. Extremities: no cyanosis, clubbing, rash, edema Neuro: alert & orientedx3, cranial nerves grossly intact. moves all 4 extremities w/o difficulty. Affect pleasant  Improved NYHA II-III but EF still down and frequent PVCs on exam. Will continue amio for now but would like to avoid long-term use. Repeat zio monitor. If PVC burden > 10% consider referral to Dr. Lenard Galloway for second opinion on high-risk repeat PVC ablation. (Vs trial of mexilitene). Agree with adding spiro. ICD interrogated personally. HL score 0. No VT/AF.   Glori Bickers, MD  3:29 PM

## 2021-01-14 NOTE — Progress Notes (Signed)
Zio patch placed onto patient.  All instructions and information reviewed with patient, they verbalize understanding with no questions. 

## 2021-01-15 NOTE — Addendum Note (Signed)
Encounter addended by: [name], RN on: 01/15/2021 2:47 PM  Actions taken: Order list changed, Diagnosis association updated

## 2021-01-16 ENCOUNTER — Telehealth: Payer: Self-pay

## 2021-01-16 NOTE — Telephone Encounter (Signed)
VMT pt requesting call back to discuss PREP 

## 2021-01-21 ENCOUNTER — Encounter: Payer: HMO | Admitting: Student

## 2021-01-21 ENCOUNTER — Other Ambulatory Visit: Payer: HMO

## 2021-01-28 ENCOUNTER — Ambulatory Visit (INDEPENDENT_AMBULATORY_CARE_PROVIDER_SITE_OTHER): Payer: HMO

## 2021-01-28 DIAGNOSIS — Z95810 Presence of automatic (implantable) cardiac defibrillator: Secondary | ICD-10-CM | POA: Diagnosis not present

## 2021-01-28 DIAGNOSIS — I5042 Chronic combined systolic (congestive) and diastolic (congestive) heart failure: Secondary | ICD-10-CM

## 2021-01-31 NOTE — Progress Notes (Signed)
EPIC Encounter for ICM Monitoring  Patient Name: [name] is a 45 y.o. female Date: 01/31/2021 Primary Care Physican: Hoyt Koch, MD Primary Cardiologist: Bensimhon Electrophysiologist: Caryl Comes 12/24/2020 Office Weight: 219 lbs                                                             Transmission reviewed.    Heartlogic HF Index 2 suggesting normal fluid levels.  Barostim implant 6/27.   Prescribed: Torsemide 20 mg Take 1 tablet  (20 mg total) by mouth daily.  Potassium 20 mEq take 1 tablet daily Spironolactone 25 mg take 1 tablet (25 mg total) at bedtime.    Labs: 12/24/2020 Creatinine 0.80, BUN 7,   Potassium 3.6, Sodium 136, GFR >60 11/29/2020 Creatinine 0.77, BUN 5,   Potassium 3.1, Sodium 138, GFR >60 11/06/2020 Creatinine 0.78, BUN <5, Potassium 3.3, Sodium 137, GFR >60 10/16/2020 Creatinine 0.86, BUN 10, Potassium 3.7, Sodium 142, GFR >60  A complete set of results can be found in Results Review.   Recommendations:  No changes.   Follow-up plan: ICM clinic phone appointment on 03/03/2021.   91 day device clinic remote transmission 03/18/2021.              EP/Cardiology next office visit:   04/24/2021 with Dr Haroldine Laws.   02/04/2021 with Oda Kilts, PA for Barostim adjustment.   Copy of ICM check sent to Dr. Caryl Comes.  3 Month Trend    8 Day Data Trend          [name], RN 01/31/2021 3:07 PM

## 2021-02-04 ENCOUNTER — Encounter: Payer: Self-pay | Admitting: Student

## 2021-02-04 ENCOUNTER — Ambulatory Visit (INDEPENDENT_AMBULATORY_CARE_PROVIDER_SITE_OTHER): Payer: HMO | Admitting: Student

## 2021-02-04 ENCOUNTER — Other Ambulatory Visit: Payer: Self-pay

## 2021-02-04 VITALS — BP 132/91 | HR 72 | Wt 214.8 lb

## 2021-02-04 DIAGNOSIS — Z95810 Presence of automatic (implantable) cardiac defibrillator: Secondary | ICD-10-CM

## 2021-02-04 DIAGNOSIS — I493 Ventricular premature depolarization: Secondary | ICD-10-CM

## 2021-02-04 DIAGNOSIS — I5042 Chronic combined systolic (congestive) and diastolic (congestive) heart failure: Secondary | ICD-10-CM | POA: Diagnosis not present

## 2021-02-04 MED ORDER — AMIODARONE HCL 200 MG PO TABS: 200.0000 mg | ORAL_TABLET | Freq: Every day | ORAL | 3 refills | Status: DC

## 2021-02-04 NOTE — Progress Notes (Signed)
Electrophysiology Office Note Date: 02/04/2021  ID:  [name], DOB [birth year], MRN [id number]  PCP: Hoyt Koch, MD Primary Cardiologist: Sinclair Grooms, MD Electrophysiologist: Cristopher Peru, MD   CC: Barostim titration   [name] is a 45 y.o. female seen today for barostim titration. she is doing well since implantation. At last visit, pt device titrated from 3.2 - 4.0.   She was seen by HF clinic 8/23 and spiro increased.   The patients goals for barostim titration are to improve her fatigue. She wants to be able to do more Dance/Tap lessons. Currently she can get through about a half a class a week. Her goal is to work up to tolerating 2 classes a week.    She does not feel like Barostim titration has significantly changed her symptoms.  Device History: Barostim (standard) implanted 11/08/20 for Chronic systolic CHF Medtronic Single Chamber ICD implanted 2018 for NICM   Past Medical History:  Diagnosis Date   Acute on chronic systolic (congestive) heart failure ([employer]) 04/29/2017   Acute pain of right shoulder 06/16/2017   AICD (automatic cardioverter/defibrillator) present    Anginal pain (HCC)    Anxiety    Arthritis    right shoulder    Asthma    CHF (congestive heart failure) ([place])    Chronic combined systolic and diastolic heart failure ([place]) 03/05/2014   Closed low lateral malleolus fracture 10/23/2013   Cystitis 10/21/2017   Depression    Depression with anxiety 01/20/2013   Diabetes mellitus without complication (HCC)    Diverticulosis    Dyspnea    comes and goes intermittently mostly with exertion    Dysrhythmia    Essential hypertension    Prev followed by [name]    Fibroid    age 46   Gallstones    Generalized abdominal cramping    History of cardiomyopathy    Hypertension    IBS (irritable bowel syndrome)    ICD (implantable cardioverter-defibrillator), single, 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   Insomnia 05/07/2017    Labral tear of shoulder 04/04/2015    Injected 04/04/2015 Injected 12/03/2015    Migraine    "monthly" (08/03/2016)   Myofascial pain 06/16/2017   NICM (nonischemic cardiomyopathy) ([place]) 08/03/2016   Nonallopathic lesion of lumbosacral region 11/16/2016   Nonallopathic lesion of sacral region 11/16/2016   Nonallopathic lesion of thoracic region 08/20/2014   Nonspecific chest pain 04/28/2017   OSA (obstructive sleep apnea) 01/02/2013   NPSG 2009:  AHI 9/hr. CPAP intolerance >> "smothering" Good tolerance of auto device (optimal pressure 12-13 on download).  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   PVC (premature ventricular contraction) 06/23/2016   Seizures ([place])    "as a child" (08/03/2016)   Termination of pregnancy    due to cardiac risk   Past Surgical History:  Procedure Laterality Date   CARDIAC CATHETERIZATION N/A 11/11/2015   Procedure: Right/Left Heart Cath and Coronary Angiography;  Surgeon: Troy Sine, MD;  Location: [place] [facility];  Service: Cardiovascular;  Laterality: N/A;   CARDIAC CATHETERIZATION  ~ 2015   CARDIAC DEFIBRILLATOR PLACEMENT  08/03/2016   CESAREAN SECTION  1999   COLONOSCOPY WITH PROPOFOL N/A 04/21/2016   Procedure: COLONOSCOPY WITH PROPOFOL;  Surgeon: Lajuan Lines  Pyrtle, MD;  [facility];  Service: Gastroenterology;  Laterality: N/A;   ESOPHAGOGASTRODUODENOSCOPY (EGD) WITH PROPOFOL N/A 04/21/2016   Procedure: ESOPHAGOGASTRODUODENOSCOPY (EGD) WITH PROPOFOL;  Surgeon: Jerene Bears, MD;  [facility]: [facility];  Service: Gastroenterology;  Laterality: N/A;   FOOT FRACTURE SURGERY Right ~ 2003   FRACTURE SURGERY     ICD IMPLANT N/A 08/03/2016   Procedure: ICD Implant;  Surgeon: Deboraha Sprang, MD;  Location: [place] [facility];  Service: Cardiovascular;  Laterality: N/A;   LAPAROSCOPIC  CHOLECYSTECTOMY  12/2006   LAPAROSCOPIC GASTRIC SLEEVE RESECTION     LAPAROSCOPY ABDOMEN DIAGNOSTIC  2008   "cut bile duct w/gallbladder OR; had to go in later & fix leak; hospitalized for 2 months"   LEFT HEART CATHETERIZATION WITH CORONARY ANGIOGRAM N/A 02/26/2014   Procedure: LEFT HEART CATHETERIZATION WITH CORONARY ANGIOGRAM;  Surgeon: Jettie Booze, MD;  Location: [facility];  Service: Cardiovascular;  Laterality: N/A;   OVARIAN CYST REMOVAL Right 1999   PVC ABLATION N/A 12/01/2017   Procedure: PVC ABLATION;  Surgeon: Evans Lance, MD;  Location: [place] [facility];  Service: Cardiovascular;  Laterality: N/A;   RIGHT HEART CATH N/A 08/05/2017   Procedure: RIGHT HEART CATH;  Surgeon: Jolaine Artist, MD;  Location: [place] [facility];  Service: Cardiovascular;  Laterality: N/A;   RIGHT HEART CATH N/A 11/16/2019   Procedure: RIGHT HEART CATH;  Surgeon: Jolaine Artist, MD;  Location: [employer] [facility];  Service: Cardiovascular;  Laterality: N/A;   TUBAL LIGATION  1999    Current Outpatient Medications  Medication Sig Dispense Refill   albuterol (PROAIR HFA) 108 (90 Base) MCG/ACT inhaler Inhale 1-2 puffs into the lungs every 6 (six) hours as needed for wheezing or shortness of breath. 18 g 5   amiodarone (PACERONE) 200 MG tablet Take 1 tablet (200 mg total) by mouth 2 (two) times daily. 60 tablet 2   budesonide-formoterol (SYMBICORT) 160-4.5 MCG/ACT inhaler Inhale 2 puffs into the lungs 2 (two) times daily as needed. 30.6 each 3   diclofenac (VOLTAREN) 75 MG EC tablet Take 1 tablet (75 mg total) by mouth 2 (two) times daily as needed. 30 tablet 0   diclofenac Sodium (VOLTAREN) 1 % GEL Apply 4 g topically 4 (four) times daily. 300 g 6   dicyclomine (BENTYL) 20 MG tablet Take 1 tablet (20 mg total) by mouth daily as needed for spasms. 90 tablet 3   empagliflozin (JARDIANCE) 10 MG TABS tablet Take 1 tablet (10 mg total) by mouth daily. 30 tablet 3   fluticasone (FLONASE) 50  MCG/ACT nasal spray Place 2 sprays into both nostrils daily as needed for allergies. 48 g 0   gabapentin (NEURONTIN) 300 MG capsule Take 1 capsule (300 mg total) by mouth at bedtime as needed (back spasms). 90 capsule 3   ipratropium-albuterol (DUONEB) 0.5-2.5 (3) MG/3ML SOLN Take 3 mLs by nebulization every 6 (six) hours as needed. 360 mL 0   ivabradine (CORLANOR) 7.5 MG TABS tablet Take 1 tablet (7.5 mg total) by mouth 2 (two) times daily with a meal. 60 tablet 2   lidocaine (LIDODERM) 5 % Place 1 patch onto the skin daily. Remove & Discard patch within 12 hours or as directed by MD 90 patch 3   Magnesium Oxide 200 MG TABS Take 1 tablet (200 mg total) by mouth daily. 90 tablet 3   metoprolol succinate (TOPROL-XL) 50 MG 24 hr tablet Take 1 tablet (50 mg total) by mouth  daily. Take with or immediately following a meal. 30 tablet 3   Multiple Vitamins-Minerals (MULTIVITAMIN GUMMIES WOMENS PO) Take 2 tablets by mouth at bedtime.      nitroGLYCERIN (NITROSTAT) 0.4 MG SL tablet Place 1 tablet (0.4 mg total) under the tongue every 5 (five) minutes as needed for chest pain. 75 tablet 0   omeprazole (PRILOSEC) 20 MG capsule Take 1 capsule (20 mg total) by mouth daily as needed (for reflux symptoms). 30 capsule 0   POTASSIUM CHLORIDE PO Take 20 mEq by mouth daily in the afternoon.     sacubitril-valsartan (ENTRESTO) 49-51 MG Take 1 tablet by mouth 2 (two) times daily. 60 tablet 6   spironolactone (ALDACTONE) 25 MG tablet Take 1 tablet (25 mg total) by mouth at bedtime. 90 tablet 3   tiZANidine (ZANAFLEX) 4 MG tablet Take 4 mg by mouth every 8 (eight) hours as needed for muscle spasms.      torsemide (DEMADEX) 20 MG tablet Take 20 mg by mouth daily.     Vitamin D, Ergocalciferol, (DRISDOL) 1.25 MG (50000 UNIT) CAPS capsule Take 1 capsule (50,000 Units total) by mouth every 7 (seven) days. 12 capsule 0   No current facility-administered medications for this visit.    Allergies:   Vancomycin, Aspirin,  Contrast media [iodinated diagnostic agents], Cyclobenzaprine, Ciprofloxacin, Farxiga [dapagliflozin], and Sulfa antibiotics   Social History: Social History   Socioeconomic History   Marital status: Married    Spouse name: Not on file   Number of children: 1   Years of education: Not on file   Highest education level: Not on file  Occupational History   Occupation: stay at home mom  Tobacco Use   Smoking status: Never   Smokeless tobacco: Never  Vaping Use   Vaping Use: Never used  Substance and Sexual Activity   Alcohol use: No   Drug use: No   Sexual activity: Yes  Other Topics Concern   Not on file  Social History Narrative   Not on file   Social Determinants of Health   Financial Resource Strain: Not on file  Food Insecurity: Not on file  Transportation Needs: Not on file  Physical Activity: Not on file  Stress: Not on file  Social Connections: Not on file  Intimate Partner Violence: Not on file    Family History: Family History  Problem Relation Age of Onset   Emphysema Maternal Grandmother        smoked   Heart disease Maternal Grandmother 57       MI   Rheum arthritis Mother    Allergies Daughter    Colon cancer Neg Hx      Review of Systems: Review of systems complete and found to be negative unless listed in HPI.    Physical Exam: Vitals:   02/04/21 1041  BP: (!) 132/91  Pulse: 72  Weight: 214 lb 12.8 oz (97.4 kg)     General:  Well appearing. No resp difficulty. HEENT: Normal Neck: Supple. JVP 5-6. Carotids 2+ bilat; no bruits. No thyromegaly or nodule noted. Cor: PMI nondisplaced. RRR, No M/G/R noted Lungs: CTAB, normal effort. Abdomen: Soft, non-tender, non-distended, no HSM. No bruits or masses. +BS   Extremities: No cyanosis, clubbing, or rash. R and LLE no edema.  Neuro: Alert & orientedx3, cranial nerves grossly intact. moves all 4 extremities w/o difficulty. Affect pleasant   Barostim Interrogation- Performed personally and  reviewed in detail today,  See scanned report  EKG:  EKG is  not ordered today.  Recent Labs: 08/25/2020: B Natriuretic Peptide 301.6 08/26/2020: Magnesium 2.1; TSH 1.732 10/03/2020: NT-Pro BNP 913 11/06/2020: ALT 13; Hemoglobin 13.3; Platelets 198 12/24/2020: BUN 7; Creatinine, Ser 0.80; Potassium 3.9; Sodium 136   Wt Readings from Last 3 Encounters:  02/04/21 214 lb 12.8 oz (97.4 kg)  01/14/21 219 lb (99.3 kg)  12/30/20 220 lb (99.8 kg)     Other studies Reviewed: Additional studies/ records that were reviewed today include: Previous EP and HF nots  Assessment and Plan:  1. Chronic systolic CHF s/p [employer] and Barostim implantation NYHA III symptoms. Mostly fatigue  Device titrated from 4.0 millamp to 5.0 milliamp by increments of 0.4 with stable BP. Pt had stim felt as a "sticking" sensation when swallowing at 5.6. Mar[place] Kitchen  Device impedence stable. Pt goals are to improve fatigue, take tap lessons/dance class 2x a week.  Normal device function See scanned report. Will follow up in 3-4 weeks to continue titration with goal of 6-8 milliamps for chronic settings.   2. Frequent PVCs Not candidate for re-do ablation due to location of her PVCs She has a Zio patch pending. I offered to hold off on decreasing amiodarone until we get the results, but she is also concerned about long term amio use and wishes to go ahead and down-titrate. Optimize GDMT and BB as tolerated We have uptitrated her Barostim and followed her closely.  We will decrease amiodarone to 200 mg daily. I continue to worry about the long term effects of her being on higher dose amiodarone. If her PVCs come back through will increase back to BID, as unfortunately we have no other good options.  Current medicines are reviewed at length with the patient today.   The patient does not have concerns regarding her medicines.  The following changes were made today:  none  Labs/ tests ordered today include:  Orders Placed  This Encounter  Procedures   Comprehensive metabolic panel   TSH   T4, free   Pro b natriuretic peptide (BNP)    Disposition:   Follow up with EP APP in 4 Weeks for further barostim titration.  Jacalyn Lefevre, PA-C  02/04/2021 10:52 AM  [facility] [address] [place] [address] [phone] (office) [phone] (fax)

## 2021-02-04 NOTE — Patient Instructions (Signed)
Medication Instructions:  Your physician has recommended you make the following change in your medication:   DECREASE: Amiodarone to '200mg'$  daily  *If you need a refill on your cardiac medications before your next appointment, please call your pharmacy*   Lab Work: TODAY: CMET, TSH, BNP, Free T4  If you have labs (blood work) drawn today and your tests are completely normal, you will receive your results only by: [place] (if you have MyChart) OR A paper copy in the mail If you have any lab test that is abnormal or we need to change your treatment, we will call you to review the results.   Follow-Up: At [facility], you and your health needs are our priority.  As part of our continuing mission to provide you with exceptional heart care, we have created designated Provider Care Teams.  These Care Teams include your primary Cardiologist (physician) and Advanced Practice Providers (APPs -  Physician Assistants and Nurse Practitioners) who all work together to provide you with the care you need, when you need it.   Your next appointment:   02/25/2021

## 2021-02-05 LAB — COMPREHENSIVE METABOLIC PANEL
ALT: 6 IU/L (ref 0–32)
AST: 13 IU/L (ref 0–40)
Albumin/Globulin Ratio: 1.5 (ref 1.2–2.2)
Albumin: 4.5 g/dL (ref 3.8–4.8)
Alkaline Phosphatase: 88 IU/L (ref 44–121)
BUN/Creatinine Ratio: 7 — ABNORMAL LOW (ref 9–23)
BUN: 7 mg/dL (ref 6–24)
Bilirubin Total: 0.6 mg/dL (ref 0.0–1.2)
CO2: 23 mmol/L (ref 20–29)
Calcium: 9.7 mg/dL (ref 8.7–10.2)
Chloride: 100 mmol/L (ref 96–106)
Creatinine, Ser: 0.94 mg/dL (ref 0.57–1.00)
Globulin, Total: 3.1 g/dL (ref 1.5–4.5)
Glucose: 93 mg/dL (ref 65–99)
Potassium: 3.4 mmol/L — ABNORMAL LOW (ref 3.5–5.2)
Sodium: 137 mmol/L (ref 134–144)
Total Protein: 7.6 g/dL (ref 6.0–8.5)
eGFR: 76 mL/min/{1.73_m2} (ref >59)

## 2021-02-05 LAB — TSH: TSH: 2.85 u[IU]/mL (ref 0.450–4.500)

## 2021-02-05 LAB — T4, FREE: Free T4: 1.55 ng/dL (ref 0.82–1.77)

## 2021-02-05 LAB — PRO B NATRIURETIC PEPTIDE: NT-Pro BNP: 857 pg/mL — ABNORMAL HIGH (ref 0–249)

## 2021-02-05 NOTE — Progress Notes (Deleted)
[place] Phone: [phone] Subjective:    I'm seeing this patient by the request  of:  Hoyt Koch, MD  CC:   [id number]  12/26/2020 Patient given injection and tolerated the procedure well.  Discussed icing regimen and home exercises.  Discussed icing regimen.  Patient follow-up with me again 6 to 8 weeks after starting with home therapy.  Avoided HVLA or other manipulation secondary to patient's recent surgical intervention.  Update 02/06/2021 [name] is a 45 y.o. female coming in with complaint of R hip pain. Patient states       Past Medical History:  Diagnosis Date   Acute on chronic systolic (congestive) heart failure ([place]) 04/29/2017   Acute pain of right shoulder 06/16/2017   AICD (automatic cardioverter/defibrillator) present    Anginal pain (HCC)    Anxiety    Arthritis    right shoulder    Asthma    CHF (congestive heart failure) ([place])    Chronic combined systolic and diastolic heart failure ([place]) 03/05/2014   Closed low lateral malleolus fracture 10/23/2013   Cystitis 10/21/2017   Depression    Depression with anxiety 01/20/2013   Diabetes mellitus without complication (HCC)    Diverticulosis    Dyspnea    comes and goes intermittently mostly with exertion    Dysrhythmia    Essential hypertension    Prev followed by [name]    Fibroid    age 26   Gallstones    Generalized abdominal cramping    History of cardiomyopathy    Hypertension    IBS (irritable bowel syndrome)    ICD (implantable cardioverter-defibrillator), single, 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   Insomnia 05/07/2017   Labral tear of shoulder 04/04/2015    Injected 04/04/2015 Injected 12/03/2015    Migraine    "monthly" (08/03/2016)   Myofascial pain 06/16/2017   NICM (nonischemic cardiomyopathy) ([place]) 08/03/2016   Nonallopathic lesion of lumbosacral region 11/16/2016   Nonallopathic lesion of  sacral region 11/16/2016   Nonallopathic lesion of thoracic region 08/20/2014   Nonspecific chest pain 04/28/2017   OSA (obstructive sleep apnea) 01/02/2013   NPSG 2009:  AHI 9/hr. CPAP intolerance >> "smothering" Good tolerance of auto device (optimal pressure 12-13 on download).  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   PVC (premature ventricular contraction) 06/23/2016   Seizures ([place])    "as a child" (08/03/2016)   Termination of pregnancy    due to cardiac risk   Past Surgical History:  Procedure Laterality Date   CARDIAC CATHETERIZATION N/A 11/11/2015   Procedure: Right/Left Heart Cath and Coronary Angiography;  Surgeon: Troy Sine, MD;  Location: [place] [facility];  Service: Cardiovascular;  Laterality: N/A;   CARDIAC CATHETERIZATION  ~ 2015   CARDIAC DEFIBRILLATOR PLACEMENT  08/03/2016   CESAREAN SECTION  1999   COLONOSCOPY WITH PROPOFOL N/A 04/21/2016   Procedure: COLONOSCOPY WITH PROPOFOL;  Surgeon: Jerene Bears, MD;  [facility];  Service: Gastroenterology;  Laterality: N/A;   ESOPHAGOGASTRODUODENOSCOPY (EGD) WITH PROPOFOL N/A 04/21/2016   Procedure: ESOPHAGOGASTRODUODENOSCOPY (EGD) WITH PROPOFOL;  Surgeon: Jerene Bears, MD;  [facility];  Service: Gastroenterology;  Laterality: N/A;   FOOT FRACTURE SURGERY Right ~ 2003  FRACTURE SURGERY     ICD IMPLANT N/A 08/03/2016   Procedure: ICD Implant;  Surgeon: Deboraha Sprang, MD;  Location: [place] [facility];  Service: Cardiovascular;  Laterality: N/A;   LAPAROSCOPIC CHOLECYSTECTOMY  12/2006   LAPAROSCOPIC GASTRIC SLEEVE RESECTION     LAPAROSCOPY ABDOMEN DIAGNOSTIC  2008   "cut bile duct w/gallbladder OR; had to go in later & fix leak; hospitalized for 2 months"   LEFT HEART CATHETERIZATION WITH CORONARY ANGIOGRAM N/A 02/26/2014   Procedure: LEFT  HEART CATHETERIZATION WITH CORONARY ANGIOGRAM;  Surgeon: Jettie Booze, MD;  [facility];  Service: Cardiovascular;  Laterality: N/A;   OVARIAN CYST REMOVAL Right 1999   PVC ABLATION N/A 12/01/2017   Procedure: PVC ABLATION;  Surgeon: Evans Lance, MD;  Location: [place] [facility];  Service: Cardiovascular;  Laterality: N/A;   RIGHT HEART CATH N/A 08/05/2017   Procedure: RIGHT HEART CATH;  Surgeon: Jolaine Artist, MD;  Location: [place] [facility];  Service: Cardiovascular;  Laterality: N/A;   RIGHT HEART CATH N/A 11/16/2019   Procedure: RIGHT HEART CATH;  Surgeon: Jolaine Artist, MD;  Location: [place] [facility];  Service: Cardiovascular;  Laterality: N/A;   TUBAL LIGATION  1999   Social History   Socioeconomic History   Marital status: Married    Spouse name: Not on file   Number of children: 1   Years of education: Not on file   Highest education level: Not on file  Occupational History   Occupation: stay at home mom  Tobacco Use   Smoking status: Never   Smokeless tobacco: Never  Vaping Use   Vaping Use: Never used  Substance and Sexual Activity   Alcohol use: No   Drug use: No   Sexual activity: Yes  Other Topics Concern   Not on file  Social History Narrative   Not on file   Social Determinants of Health   Financial Resource Strain: Not on file  Food Insecurity: Not on file  Transportation Needs: Not on file  Physical Activity: Not on file  Stress: Not on file  Social Connections: Not on file   Allergies  Allergen Reactions   Vancomycin Other (See Comments)    "did something to my kidneys," PROGRESSED TO KIDNEY FAILURE!!   Aspirin Other (See Comments)    Wheezing, (Pt states that she just wheezes some when she takes aspirin by itself but she can take aspirin in a combination product).   Contrast Media [Iodinated Diagnostic Agents] Other (See Comments)    Multiple CT contrast studies done over 2 weeks caused ARF   Cyclobenzaprine  Other (See Comments)    Can not tolerate   Ciprofloxacin Itching and Rash   Farxiga [Dapagliflozin] Rash   Sulfa Antibiotics Itching and Rash   Family History  Problem Relation Age of Onset   Emphysema Maternal Grandmother        smoked   Heart disease Maternal Grandmother 25       MI   Rheum arthritis Mother    Allergies Daughter    Colon cancer Neg Hx     Current Outpatient Medications (Endocrine & Metabolic):    empagliflozin (JARDIANCE) 10 MG TABS tablet, Take 1 tablet (10 mg total) by mouth daily.  Current Outpatient Medications (Cardiovascular):    amiodarone (PACERONE) 200 MG tablet, Take 1 tablet (200 mg total) by mouth daily.   ivabradine (CORLANOR) 7.5 MG TABS tablet, Take 1 tablet (7.5 mg total) by mouth  2 (two) times daily with a meal.   metoprolol succinate (TOPROL-XL) 50 MG 24 hr tablet, Take 1 tablet (50 mg total) by mouth daily. Take with or immediately following a meal.   nitroGLYCERIN (NITROSTAT) 0.4 MG SL tablet, Place 1 tablet (0.4 mg total) under the tongue every 5 (five) minutes as needed for chest pain.   sacubitril-valsartan (ENTRESTO) 49-51 MG, Take 1 tablet by mouth 2 (two) times daily.   spironolactone (ALDACTONE) 25 MG tablet, Take 1 tablet (25 mg total) by mouth at bedtime.   torsemide (DEMADEX) 20 MG tablet, Take 20 mg by mouth daily.  Current Outpatient Medications (Respiratory):    albuterol (PROAIR HFA) 108 (90 Base) MCG/ACT inhaler, Inhale 1-2 puffs into the lungs every 6 (six) hours as needed for wheezing or shortness of breath.   budesonide-formoterol (SYMBICORT) 160-4.5 MCG/ACT inhaler, Inhale 2 puffs into the lungs 2 (two) times daily as needed.   fluticasone (FLONASE) 50 MCG/ACT nasal spray, Place 2 sprays into both nostrils daily as needed for allergies.   ipratropium-albuterol (DUONEB) 0.5-2.5 (3) MG/3ML SOLN, Take 3 mLs by nebulization every 6 (six) hours as needed.  Current Outpatient Medications (Analgesics):    diclofenac (VOLTAREN) 75  MG EC tablet, Take 1 tablet (75 mg total) by mouth 2 (two) times daily as needed.   Current Outpatient Medications (Other):    diclofenac Sodium (VOLTAREN) 1 % GEL, Apply 4 g topically 4 (four) times daily.   dicyclomine (BENTYL) 20 MG tablet, Take 1 tablet (20 mg total) by mouth daily as needed for spasms.   gabapentin (NEURONTIN) 300 MG capsule, Take 1 capsule (300 mg total) by mouth at bedtime as needed (back spasms).   lidocaine (LIDODERM) 5 %, Place 1 patch onto the skin daily. Remove & Discard patch within 12 hours or as directed by MD   Magnesium Oxide 200 MG TABS, Take 1 tablet (200 mg total) by mouth daily.   Multiple Vitamins-Minerals (MULTIVITAMIN GUMMIES WOMENS PO), Take 2 tablets by mouth at bedtime.    omeprazole (PRILOSEC) 20 MG capsule, Take 1 capsule (20 mg total) by mouth daily as needed (for reflux symptoms).   POTASSIUM CHLORIDE PO, Take 20 mEq by mouth daily in the afternoon.   tiZANidine (ZANAFLEX) 4 MG tablet, Take 4 mg by mouth every 8 (eight) hours as needed for muscle spasms.    Vitamin D, Ergocalciferol, (DRISDOL) 1.25 MG (50000 UNIT) CAPS capsule, Take 1 capsule (50,000 Units total) by mouth every 7 (seven) days.   Reviewed prior external information including notes and imaging from  primary care provider As well as notes that were available from care everywhere and other healthcare systems.  Past medical history, social, surgical and family history all reviewed in electronic medical record.  No pertanent information unless stated regarding to the chief complaint.   Review of Systems:  No headache, visual changes, nausea, vomiting, diarrhea, constipation, dizziness, abdominal pain, skin rash, fevers, chills, night sweats, weight loss, swollen lymph nodes, body aches, joint swelling, chest pain, shortness of breath, mood changes. POSITIVE muscle aches  Objective  There were no vitals taken for this visit.   General: No apparent distress alert and oriented x3  mood and affect normal, dressed appropriately.  HEENT: Pupils equal, extraocular movements intact  Respiratory: [name] speak in full sentences and does not appear short of breath  Cardiovascular: No lower extremity edema, non tender, no erythema  Gait normal with good balance and coordination.  MSK:  Non tender with full range of motion  and good stability and symmetric strength and tone of shoulders, elbows, wrist, hip, knee and ankles bilaterally.     Impression and Recommendations:     The above documentation has been reviewed and is accurate and complete Jacqualin Combes

## 2021-02-06 ENCOUNTER — Ambulatory Visit: Payer: HMO | Admitting: Family Medicine

## 2021-02-06 ENCOUNTER — Encounter: Payer: Self-pay | Admitting: Family Medicine

## 2021-02-10 NOTE — Addendum Note (Signed)
Encounter addended by: Micki Riley, RN on: 02/10/2021 1:14 PM  Actions taken: Imaging Exam ended

## 2021-02-17 ENCOUNTER — Other Ambulatory Visit: Payer: Self-pay | Admitting: Internal Medicine

## 2021-02-17 ENCOUNTER — Encounter: Payer: Self-pay | Admitting: Internal Medicine

## 2021-02-17 DIAGNOSIS — J029 Acute pharyngitis, unspecified: Secondary | ICD-10-CM

## 2021-02-17 DIAGNOSIS — J00 Acute nasopharyngitis [common cold]: Secondary | ICD-10-CM

## 2021-02-17 MED ORDER — ALBUTEROL SULFATE HFA 108 (90 BASE) MCG/ACT IN AERS: 1.0000 | INHALATION_SPRAY | Freq: Four times a day (QID) | RESPIRATORY_TRACT | 0 refills | Status: DC | PRN

## 2021-02-17 MED ORDER — BUDESONIDE-FORMOTEROL FUMARATE 160-4.5 MCG/ACT IN AERO: 2.0000 | INHALATION_SPRAY | Freq: Two times a day (BID) | RESPIRATORY_TRACT | 0 refills | Status: DC | PRN

## 2021-02-17 MED ORDER — FLUTICASONE PROPIONATE 50 MCG/ACT NA SUSP: 2.0000 | Freq: Every day | NASAL | 0 refills | Status: DC | PRN

## 2021-02-18 ENCOUNTER — Inpatient Hospital Stay: Payer: HMO

## 2021-02-18 ENCOUNTER — Inpatient Hospital Stay: Payer: HMO | Admitting: Hematology and Oncology

## 2021-02-25 ENCOUNTER — Encounter: Payer: Self-pay | Admitting: Hematology and Oncology

## 2021-02-25 ENCOUNTER — Inpatient Hospital Stay: Payer: HMO | Admitting: Hematology and Oncology

## 2021-02-25 ENCOUNTER — Other Ambulatory Visit: Payer: Self-pay

## 2021-02-25 ENCOUNTER — Inpatient Hospital Stay: Payer: HMO | Attending: Hematology and Oncology

## 2021-02-25 VITALS — BP 138/79 | HR 86 | Temp 98.2°F | Resp 18 | Ht 65.0 in | Wt 212.7 lb

## 2021-02-25 DIAGNOSIS — I5042 Chronic combined systolic (congestive) and diastolic (congestive) heart failure: Secondary | ICD-10-CM | POA: Insufficient documentation

## 2021-02-25 DIAGNOSIS — Z79899 Other long term (current) drug therapy: Secondary | ICD-10-CM | POA: Diagnosis not present

## 2021-02-25 DIAGNOSIS — D500 Iron deficiency anemia secondary to blood loss (chronic): Secondary | ICD-10-CM | POA: Insufficient documentation

## 2021-02-25 DIAGNOSIS — N920 Excessive and frequent menstruation with regular cycle: Secondary | ICD-10-CM | POA: Insufficient documentation

## 2021-02-25 DIAGNOSIS — I110 Hypertensive heart disease with heart failure: Secondary | ICD-10-CM | POA: Diagnosis not present

## 2021-02-25 DIAGNOSIS — E119 Type 2 diabetes mellitus without complications: Secondary | ICD-10-CM | POA: Diagnosis not present

## 2021-02-25 LAB — COMPREHENSIVE METABOLIC PANEL
ALT: 10 U/L (ref 0–44)
AST: 17 U/L (ref 15–41)
Albumin: 4.3 g/dL (ref 3.5–5.0)
Alkaline Phosphatase: 75 U/L (ref 38–126)
Anion gap: 12 (ref 5–15)
BUN: 11 mg/dL (ref 6–20)
CO2: 23 mmol/L (ref 22–32)
Calcium: 9.6 mg/dL (ref 8.9–10.3)
Chloride: 103 mmol/L (ref 98–111)
Creatinine, Ser: 1.03 mg/dL — ABNORMAL HIGH (ref 0.44–1.00)
GFR, Estimated: >60 mL/min (ref >60)
Glucose, Bld: 102 mg/dL — ABNORMAL HIGH (ref 70–99)
Potassium: 4 mmol/L (ref 3.5–5.1)
Sodium: 138 mmol/L (ref 135–145)
Total Bilirubin: 0.6 mg/dL (ref 0.3–1.2)
Total Protein: 8.2 g/dL — ABNORMAL HIGH (ref 6.5–8.1)

## 2021-02-25 LAB — CBC WITH DIFFERENTIAL/PLATELET
Abs Immature Granulocytes: 0 10*3/uL (ref 0.00–0.07)
Basophils Absolute: 0.1 10*3/uL (ref 0.0–0.1)
Basophils Relative: 1 %
Eosinophils Absolute: 0.1 10*3/uL (ref 0.0–0.5)
Eosinophils Relative: 1 %
HCT: 43.9 % (ref 36.0–46.0)
Hemoglobin: 14.3 g/dL (ref 12.0–15.0)
Immature Granulocytes: 0 %
Lymphocytes Relative: 45 %
Lymphs Abs: 2.6 10*3/uL (ref 0.7–4.0)
MCH: 29.7 pg (ref 26.0–34.0)
MCHC: 32.6 g/dL (ref 30.0–36.0)
MCV: 91.3 fL (ref 80.0–100.0)
Monocytes Absolute: 0.5 10*3/uL (ref 0.1–1.0)
Monocytes Relative: 8 %
Neutro Abs: 2.6 10*3/uL (ref 1.7–7.7)
Neutrophils Relative %: 45 %
Platelets: 182 10*3/uL (ref 150–400)
RBC: 4.81 MIL/uL (ref 3.87–5.11)
RDW: 12.6 % (ref 11.5–15.5)
WBC: 5.8 10*3/uL (ref 4.0–10.5)
nRBC: 0 % (ref 0.0–0.2)

## 2021-02-25 NOTE — Progress Notes (Signed)
Will CONSULT NOTE  Patient Care Team: Hoyt Koch, MD as PCP - General (Internal Medicine) Bensimhon, Shaune Pascal, MD as PCP - Advanced Heart Failure (Cardiology) Belva Crome, MD as PCP - Cardiology (Cardiology) Evans Lance, MD as PCP - Electrophysiology (Cardiology) Tanda Rockers, MD (Pulmonary Disease) Chucky May, MD (Psychiatry) Lelon Perla, MD (Cardiology) Rigoberto Noel, MD (Pulmonary Disease) Charlton Haws, [facility] as Pharmacist (Pharmacist)  CHIEF COMPLAINTS/PURPOSE OF CONSULTATION:  [name], follow up after iron infusion  ASSESSMENT & PLAN:   This is a very pleasant 45 year old female patient with iron deficiency anemia from chronic blood loss secondary to menorrhagia status post intravenous iron supplementation who is here for follow-up.  Since last visit, she has some baseline fatigue and shortness of breath.  She continues to have heavy menstrual cycles.  She attributes some of her symptoms to her cardiac issues.  She denies any pica.  Physical examination today unremarkable.  CBC today completely unremarkable, no evidence of anemia.  I have advised her to continue over-the-counter ferrous sulfate once a day and return to clinic in 3/4 months with repeat labs. She was encouraged to reach out to [place] sooner with any new questions or concerns.  HISTORY OF PRESENTING ILLNESS:  [name] 45 y.o. female is here because of IDA  Interval History  [name] is here for follow up. She is doing well except for ongoing cardiac issues She has a barostimulator in place now to control her HTN. She feels tired and SOB but this could very well be from her heart issues. She still has heavy menstrual cycles. Rest of the pertinent 10 point ROS reviewed and negative.  MEDICAL HISTORY:  Past Medical History:  Diagnosis Date   Acute on chronic systolic (congestive) heart failure (HCC) 04/29/2017   Acute pain of right shoulder 06/16/2017    AICD (automatic cardioverter/defibrillator) present    Anginal pain (HCC)    Anxiety    Arthritis    right shoulder    Asthma    CHF (congestive heart failure) ([place])    Chronic combined systolic and diastolic heart failure (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 CPAP intolerance >> "smothering" Good tolerance of auto device (optimal pressure 12-13 on download).  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   PVC (premature ventricular contraction) 06/23/2016   Seizures ([place])    "as a child" (08/03/2016)   Termination of pregnancy    due to cardiac risk    SURGICAL HISTORY: Past Surgical History:  Procedure Laterality Date   CARDIAC CATHETERIZATION N/A 11/11/2015   Procedure: Right/Left Heart Cath and Coronary  Angiography;  Surgeon: Troy Sine, MD;  Location: [place] [facility];  Service: Cardiovascular;  Laterality: N/A;   CARDIAC CATHETERIZATION  ~ 2015   CARDIAC DEFIBRILLATOR PLACEMENT  08/03/2016   CESAREAN SECTION  1999   COLONOSCOPY WITH PROPOFOL N/A 04/21/2016   Procedure: COLONOSCOPY WITH PROPOFOL;  Surgeon: Jerene Bears, MD;  Location: [facility];  Service: Gastroenterology;  Laterality: N/A;   ESOPHAGOGASTRODUODENOSCOPY (EGD) WITH PROPOFOL N/A 04/21/2016   Procedure: ESOPHAGOGASTRODUODENOSCOPY (EGD) WITH PROPOFOL;  Surgeon: Jerene Bears, MD;  Location: [facility];  Service: Gastroenterology;  Laterality: N/A;   FOOT FRACTURE SURGERY Right ~ 2003   FRACTURE SURGERY     ICD IMPLANT N/A 08/03/2016   Procedure: ICD Implant;  Surgeon: Deboraha Sprang, MD;  Location: [place] [facility];  Service: Cardiovascular;  Laterality: N/A;   LAPAROSCOPIC CHOLECYSTECTOMY  12/2006   LAPAROSCOPIC GASTRIC SLEEVE RESECTION     LAPAROSCOPY ABDOMEN DIAGNOSTIC  2008   "cut bile duct w/gallbladder OR; had to go in later & fix leak; hospitalized for 2 months"   LEFT HEART CATHETERIZATION WITH CORONARY ANGIOGRAM N/A 02/26/2014   Procedure: LEFT HEART CATHETERIZATION WITH CORONARY ANGIOGRAM;  Surgeon: Jettie Booze, MD;  Location: [facility];  Service: Cardiovascular;  Laterality: N/A;   OVARIAN CYST REMOVAL Right 1999   PVC ABLATION N/A 12/01/2017   Procedure: PVC ABLATION;  Surgeon: Evans Lance, MD;  Location: [place] [facility];  Service: Cardiovascular;  Laterality: N/A;   RIGHT HEART CATH N/A 08/05/2017   Procedure: RIGHT HEART CATH;  Surgeon: Jolaine Artist, MD;  Location: [place] [facility];  Service: Cardiovascular;  Laterality: N/A;   RIGHT HEART CATH N/A 11/16/2019   Procedure: RIGHT HEART CATH;  Surgeon: Jolaine Artist, MD;  Location: [place] [facility];  Service: Cardiovascular;  Laterality: N/A;   TUBAL LIGATION  1999    SOCIAL HISTORY: Social History   Socioeconomic  History   Marital status: Married    Spouse name: Not on file   Number of children: 1   Years of education: Not on file   Highest education level: Not on file  Occupational History   Occupation: stay at home mom  Tobacco Use   Smoking status: Never   Smokeless tobacco: Never  Vaping Use   Vaping Use: Never used  Substance and Sexual Activity   Alcohol use: No   Drug use: No   Sexual activity: Yes  Other Topics Concern   Not on file  Social History Narrative   Not on file   Social Determinants of Health   Financial Resource Strain: Not on file  Food Insecurity: Not on file  Transportation Needs: Not on file  Physical Activity: Not on file  Stress: Not on file  Social Connections: Not on file  Intimate Partner Violence: Not on file    FAMILY HISTORY: Family History  Problem Relation Age of Onset   Emphysema Maternal Grandmother        smoked   Heart disease Maternal Grandmother 67  MI   Rheum arthritis Mother    Allergies Daughter    Colon cancer Neg Hx     ALLERGIES:  is allergic to vancomycin, aspirin, contrast media [iodinated diagnostic agents], cyclobenzaprine, ciprofloxacin, farxiga [dapagliflozin], and sulfa antibiotics.  MEDICATIONS:  Current Outpatient Medications  Medication Sig Dispense Refill   albuterol (PROAIR HFA) 108 (90 Base) MCG/ACT inhaler Inhale 1-2 puffs into the lungs every 6 (six) hours as needed for wheezing or shortness of breath. Follow-up appt is due must see provider for future refills 18 g 0   amiodarone (PACERONE) 200 MG tablet Take 1 tablet (200 mg total) by mouth daily. 90 tablet 3   budesonide-formoterol (SYMBICORT) 160-4.5 MCG/ACT inhaler Inhale 2 puffs into the lungs 2 (two) times daily as needed. Follow-up appt is due must see provider for future refills 10 g 0   diclofenac (VOLTAREN) 75 MG EC tablet Take 1 tablet (75 mg total) by mouth 2 (two) times daily as needed. 30 tablet 0   diclofenac Sodium (VOLTAREN) 1 % GEL Apply 4  g topically 4 (four) times daily. 300 g 6   dicyclomine (BENTYL) 20 MG tablet Take 1 tablet (20 mg total) by mouth daily as needed for spasms. 90 tablet 3   empagliflozin (JARDIANCE) 10 MG TABS tablet Take 1 tablet (10 mg total) by mouth daily. 30 tablet 3   fluticasone (FLONASE) 50 MCG/ACT nasal spray Place 2 sprays into both nostrils daily as needed for allergies. Follow-up appt is due must see provider for future refills 16 g 0   gabapentin (NEURONTIN) 300 MG capsule Take 1 capsule (300 mg total) by mouth at bedtime as needed (back spasms). 90 capsule 3   ipratropium-albuterol (DUONEB) 0.5-2.5 (3) MG/3ML SOLN Take 3 mLs by nebulization every 6 (six) hours as needed. 360 mL 0   ivabradine (CORLANOR) 7.5 MG TABS tablet Take 1 tablet (7.5 mg total) by mouth 2 (two) times daily with a meal. 60 tablet 2   lidocaine (LIDODERM) 5 % Place 1 patch onto the skin daily. Remove & Discard patch within 12 hours or as directed by MD 90 patch 3   Magnesium Oxide 200 MG TABS Take 1 tablet (200 mg total) by mouth daily. 90 tablet 3   metoprolol succinate (TOPROL-XL) 50 MG 24 hr tablet Take 1 tablet (50 mg total) by mouth daily. Take with or immediately following a meal. 30 tablet 3   Multiple Vitamins-Minerals (MULTIVITAMIN GUMMIES WOMENS PO) Take 2 tablets by mouth at bedtime.      nitroGLYCERIN (NITROSTAT) 0.4 MG SL tablet Place 1 tablet (0.4 mg total) under the tongue every 5 (five) minutes as needed for chest pain. 75 tablet 0   omeprazole (PRILOSEC) 20 MG capsule Take 1 capsule (20 mg total) by mouth daily as needed (for reflux symptoms). 30 capsule 0   POTASSIUM CHLORIDE PO Take 20 mEq by mouth daily in the afternoon.     sacubitril-valsartan (ENTRESTO) 49-51 MG Take 1 tablet by mouth 2 (two) times daily. 60 tablet 6   spironolactone (ALDACTONE) 25 MG tablet Take 1 tablet (25 mg total) by mouth at bedtime. 90 tablet 3   tiZANidine (ZANAFLEX) 4 MG tablet Take 4 mg by mouth every 8 (eight) hours as needed for  muscle spasms.      torsemide (DEMADEX) 20 MG tablet Take 20 mg by mouth daily.     Vitamin D, Ergocalciferol, (DRISDOL) 1.25 MG (50000 UNIT) CAPS capsule Take 1 capsule (50,000 Units total) by mouth every 7 (seven)  days. 12 capsule 0   No current facility-administered medications for this visit.    PHYSICAL EXAMINATION: ECOG PERFORMANCE STATUS: 1 - Symptomatic but completely ambulatory  Vitals:   02/25/21 1117  BP: 138/79  Pulse: 86  Resp: 18  Temp: 98.2 F (36.8 C)  SpO2: 98%   Filed Weights   02/25/21 1117  Weight: 212 lb 11.2 oz (96.5 kg)    GENERAL:alert, no distress and comfortable SKIN: skin color, texture, turgor are normal, no rashes or significant lesions EYES: normal sclera clear OROPHARYNX:no exudate, no erythema and lips, buccal mucosa, and tongue normal  NECK: supple, thyroid normal size, non-tender, without nodularity. No lymphadenopathy. LUNGS: clear to auscultation and percussion with normal breathing effort. HEART: regular rate & rhythm and no murmurs and no lower extremity edema ABDOMEN:abdomen soft, non-tender and normal bowel sounds Musculoskeletal:no cyanosis of digits and no clubbing  PSYCH: alert & oriented x 3 with fluent speech NEURO: no focal motor/sensory deficits  LABORATORY DATA:  I have reviewed the data as listed Lab Results  Component Value Date   WBC 5.8 02/25/2021   HGB 14.3 02/25/2021   HCT 43.9 02/25/2021   MCV 91.3 02/25/2021   PLT 182 02/25/2021     Chemistry      Component Value Date/Time   NA 137 02/04/2021 1115   K 3.4 (L) 02/04/2021 1115   CL 100 02/04/2021 1115   CO2 23 02/04/2021 1115   BUN 7 02/04/2021 1115   CREATININE 0.94 02/04/2021 1115   CREATININE 0.90 03/26/2016 1536      Component Value Date/Time   CALCIUM 9.7 02/04/2021 1115   ALKPHOS 88 02/04/2021 1115   AST 13 02/04/2021 1115   ALT 6 02/04/2021 1115   BILITOT 0.6 02/04/2021 1115     Labs from today reviewed, CBC normal.  RADIOGRAPHIC  STUDIES: I have personally reviewed the radiological images as listed and agreed with the findings in the report. LONG TERM MONITOR (3-14 DAYS)  Result Date: 02/17/2021 Patch Wear Time:  14 days and 0 hours (2022-08-23T15:09:15-0400 to 2022-09-06T15:09:19-0400) 1. Sinus rhythm -  avg HR of 72 bpm. 2. 370 runs nonsustained Ventricular Tachycardia runs occurred, the run with the fastest interval lasting 9 beats with a max rate of 156 bpm, the longest  lasting 9 beats with an avg rate of 125 bpm. 3. Isolated PVC were frequent (13.0%, 46270), PVC Couplets were occasional (4.6%, 12100), and PVC Triplets were rare (<1.0%, 1317). All PVCs consisted of a single morphology. Glori Bickers, MD 1:07 PM    All questions were answered. The patient knows to call the clinic with any problems, questions or concerns.  I spent 20 minutes in the care of this patient including H and P, review of records, counseling and coordination of care.    Benay Pike, MD 02/25/2021 11:23 AM

## 2021-02-27 ENCOUNTER — Other Ambulatory Visit: Payer: Self-pay

## 2021-02-28 ENCOUNTER — Ambulatory Visit (INDEPENDENT_AMBULATORY_CARE_PROVIDER_SITE_OTHER): Payer: HMO | Admitting: Internal Medicine

## 2021-02-28 ENCOUNTER — Encounter: Payer: Self-pay | Admitting: Internal Medicine

## 2021-02-28 DIAGNOSIS — Z3009 Encounter for other general counseling and advice on contraception: Secondary | ICD-10-CM | POA: Insufficient documentation

## 2021-02-28 DIAGNOSIS — Z6834 Body mass index (BMI) 34.0-34.9, adult: Secondary | ICD-10-CM

## 2021-02-28 DIAGNOSIS — I509 Heart failure, unspecified: Secondary | ICD-10-CM | POA: Diagnosis not present

## 2021-02-28 DIAGNOSIS — O903 Peripartum cardiomyopathy: Secondary | ICD-10-CM | POA: Diagnosis not present

## 2021-02-28 DIAGNOSIS — E6609 Other obesity due to excess calories: Secondary | ICD-10-CM

## 2021-02-28 DIAGNOSIS — E669 Obesity, unspecified: Secondary | ICD-10-CM | POA: Insufficient documentation

## 2021-02-28 DIAGNOSIS — E66811 Obesity, class 1: Secondary | ICD-10-CM | POA: Insufficient documentation

## 2021-02-28 MED ORDER — LEVONORGEST-ETH ESTRAD 91-DAY 0.15-0.03 &0.01 MG PO TABS: 1.0000 | ORAL_TABLET | Freq: Every day | ORAL | 4 refills | Status: DC

## 2021-02-28 NOTE — Assessment & Plan Note (Addendum)
Rx low dose continuous birth control to eliminate periods.

## 2021-02-28 NOTE — Patient Instructions (Addendum)
We can try a medicine for the weight loss. The oral options are contrave or qsymia. The injection options are wegovy (once a week) or saxenda (once a day).  I will talk with your heart doctors to see which medicine to adjust for blood pressure.   We have sent in the birth control medication to take all the time to avoid having periods.

## 2021-02-28 NOTE — Assessment & Plan Note (Signed)
Counseled about options for weight loss including saxenda, wegovy, contrave, qsymia.

## 2021-02-28 NOTE — Assessment & Plan Note (Signed)
Since adjustment in the last 2 weeks she is experiencing frequent episodes of fatigue/nausea/low BP after taking her medications. I will contact her cardiac team and see if they have recommendations of narrowing or reduction of dose or if they would need to see her back. The nausea lasts for hours after taking meds while BP is low. She did skip all her BP meds for 2-3 days and then got problems with heart again so started taking them back.

## 2021-02-28 NOTE — Progress Notes (Signed)
   Subjective:   Patient ID: [name], female    DOB: 22-Jun-1975, 45 y.o.   MRN: [id number]  HPI The patient is a 45 YO female coming in for several concerns including low BP, birth control, and wanting weight loss.   Review of Systems  Constitutional:  Positive for fatigue.  HENT: Negative.    Eyes: Negative.   Respiratory:  Negative for cough, chest tightness and shortness of breath.   Cardiovascular:  Negative for chest pain, palpitations and leg swelling.  Gastrointestinal:  Positive for nausea. Negative for abdominal distention, abdominal pain, constipation, diarrhea and vomiting.  Musculoskeletal: Negative.   Skin: Negative.   Neurological: Negative.   Psychiatric/Behavioral: Negative.     Objective:  Physical Exam Constitutional:      Appearance: She is well-developed.  HENT:     Head: Normocephalic and atraumatic.  Cardiovascular:     Rate and Rhythm: Normal rate and regular rhythm.  Pulmonary:     Effort: Pulmonary effort is normal. No respiratory distress.     Breath sounds: Normal breath sounds. No wheezing or rales.  Abdominal:     General: Bowel sounds are normal. There is no distension.     Palpations: Abdomen is soft.     Tenderness: There is no abdominal tenderness. There is no rebound.  Musculoskeletal:     Cervical back: Normal range of motion.  Skin:    General: Skin is warm and dry.  Neurological:     Mental Status: She is alert and oriented to person, place, and time.     Coordination: Coordination normal.    Vitals:   02/28/21 1103  BP: 124/74  Pulse: (!) 54  Resp: 18  SpO2: 99%  Weight: 210 lb 3.2 oz (95.3 kg)  Height: 5\' 5"  (1.651 m)    This visit occurred during the SARS-CoV-2 public health emergency.  Safety protocols were in place, including screening questions prior to the visit, additional usage of staff PPE, and extensive cleaning of exam room while observing appropriate contact time as indicated for disinfecting solutions.    Assessment & Plan:

## 2021-02-28 NOTE — Assessment & Plan Note (Signed)
She is having symptoms which sound to be from low BP after taking medications. Will consult with her cardiac providers as to plan. She is a little scared/concerned to take her medications due to severity of side effects.

## 2021-03-03 ENCOUNTER — Ambulatory Visit (INDEPENDENT_AMBULATORY_CARE_PROVIDER_SITE_OTHER): Payer: HMO

## 2021-03-03 DIAGNOSIS — I5042 Chronic combined systolic (congestive) and diastolic (congestive) heart failure: Secondary | ICD-10-CM | POA: Diagnosis not present

## 2021-03-03 DIAGNOSIS — Z95810 Presence of automatic (implantable) cardiac defibrillator: Secondary | ICD-10-CM

## 2021-03-04 ENCOUNTER — Encounter: Payer: HMO | Admitting: Student

## 2021-03-04 NOTE — Progress Notes (Signed)
EPIC Encounter for ICM Monitoring  Patient Name: [name] is a 45 y.o. female Date: 03/04/2021 Primary Care Physican: Hoyt Koch, MD Primary Cardiologist: Bensimhon Electrophysiologist: Caryl Comes 12/24/2020 Office Weight: 219 lbs                                                             Transmission reviewed.    Heartlogic HF Index 0 suggesting normal fluid levels.  Barostim implant 6/27.   Prescribed: Torsemide 20 mg Take 1 tablet  (20 mg total) by mouth daily.  Potassium 20 mEq take 1 tablet daily Spironolactone 25 mg take 1 tablet (25 mg total) at bedtime.    Labs: 02/25/2021 Creatinine 1.03, BUN 11, Potassium 4.0, Sodium 138 02/04/2021 Creatinine 0.94, BUN 7,   Potassium 3.4, Sodium 137 12/24/2020 Creatinine 0.80, BUN 7,   Potassium 3.6, Sodium 136, GFR >60 11/29/2020 Creatinine 0.77, BUN 5,   Potassium 3.1, Sodium 138, GFR >60 11/06/2020 Creatinine 0.78, BUN <5, Potassium 3.3, Sodium 137, GFR >60 10/16/2020 Creatinine 0.86, BUN 10, Potassium 3.7, Sodium 142, GFR >60  A complete set of results can be found in Results Review.   Recommendations:  No changes.   Follow-up plan: ICM clinic phone appointment on 04/07/2021.   91 day device clinic remote transmission 03/19/2021.              EP/Cardiology next office visit:   04/24/2021 with Dr Haroldine Laws.   03/17/2021 with Oda Kilts, PA.        Copy of ICM check sent to Dr. Caryl Comes.  3 Month Trend    8 Day Data Trend          [name], RN 03/04/2021 3:57 PM

## 2021-03-13 ENCOUNTER — Encounter: Payer: Self-pay | Admitting: Internal Medicine

## 2021-03-14 NOTE — Progress Notes (Deleted)
Electrophysiology Office Note Date: 03/14/2021  ID:  [name], [name] [birth year], MRN [id number]  PCP: Hoyt Koch, MD Primary Cardiologist: Sinclair Grooms, MD Electrophysiologist: Cristopher Peru, MD   CC: Pacemaker follow-up  [name] is a 45 y.o. female seen today for Cristopher Peru, MD for routine electrophysiology followup.  Since last being seen in our clinic the patient reports doing ***.  she denies chest pain, palpitations, dyspnea, PND, orthopnea, nausea, vomiting, dizziness, syncope, edema, weight gain, or early satiety.  Device History: Device History: Barostim (standard) implanted 11/08/20 for Chronic systolic CHF Medtronic Single Chamber ICD implanted 2018 for NICM  Past Medical History:  Diagnosis Date   Acute on chronic systolic (congestive) heart failure ([place]) 04/29/2017   Acute pain of right shoulder 06/16/2017   AICD (automatic cardioverter/defibrillator) present    Anginal pain (HCC)    Anxiety    Arthritis    right shoulder    Asthma    CHF (congestive heart failure) ([place])    Chronic combined systolic and diastolic heart failure ([place]) 03/05/2014   Closed low lateral malleolus fracture 10/23/2013   Cystitis 10/21/2017   Depression    Depression with anxiety 01/20/2013   Diabetes mellitus without complication (HCC)    Diverticulosis    Dyspnea    comes and goes intermittently mostly with exertion    Dysrhythmia    Essential hypertension    Prev followed by [name]    Fibroid    age 48   Gallstones    Generalized abdominal cramping    History of cardiomyopathy    Hypertension    IBS (irritable bowel syndrome)    ICD (implantable cardioverter-defibrillator), single, 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   Insomnia 05/07/2017   Labral tear of shoulder 04/04/2015    Injected 04/04/2015 Injected 12/03/2015    Migraine    "monthly" (08/03/2016)   Myofascial pain 06/16/2017   NICM (nonischemic cardiomyopathy) ([place]) 08/03/2016    Nonallopathic lesion of lumbosacral region 11/16/2016   Nonallopathic lesion of sacral region 11/16/2016   Nonallopathic lesion of thoracic region 08/20/2014   Nonspecific chest pain 04/28/2017   OSA (obstructive sleep apnea) 01/02/2013   NPSG 2009:  AHI 9/hr. CPAP intolerance >> "smothering" Good tolerance of auto device (optimal pressure 12-13 on download).  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   PVC (premature ventricular contraction) 06/23/2016   Seizures ([place])    "as a child" (08/03/2016)   Termination of pregnancy    due to cardiac risk   Past Surgical History:  Procedure Laterality Date   CARDIAC CATHETERIZATION N/A 11/11/2015   Procedure: Right/Left Heart Cath and Coronary Angiography;  Surgeon: Troy Sine, MD;  Location: [facility];  Service: Cardiovascular;  Laterality: N/A;   CARDIAC CATHETERIZATION  ~ 2015   CARDIAC DEFIBRILLATOR PLACEMENT  08/03/2016   CESAREAN SECTION  1999   COLONOSCOPY WITH PROPOFOL N/A 04/21/2016   Procedure: COLONOSCOPY WITH PROPOFOL;  Surgeon: Jerene Bears, MD;  Location: [facility];  Service: Gastroenterology;  Laterality: N/A;   ESOPHAGOGASTRODUODENOSCOPY (EGD) WITH PROPOFOL N/A 04/21/2016   Procedure: ESOPHAGOGASTRODUODENOSCOPY (EGD) WITH PROPOFOL;  Surgeon: Jerene Bears, MD;  Location: [facility];  Service: Gastroenterology;  Laterality: N/A;   FOOT FRACTURE SURGERY Right ~ 2003   FRACTURE  SURGERY     ICD IMPLANT N/A 08/03/2016   Procedure: ICD Implant;  Surgeon: Deboraha Sprang, MD;  Location: [facility];  Service: Cardiovascular;  Laterality: N/A;   LAPAROSCOPIC CHOLECYSTECTOMY  12/2006   LAPAROSCOPIC GASTRIC SLEEVE RESECTION     LAPAROSCOPY ABDOMEN DIAGNOSTIC  2008   "cut bile duct w/gallbladder OR; had to go in later & fix leak; hospitalized for 2 months"    LEFT HEART CATHETERIZATION WITH CORONARY ANGIOGRAM N/A 02/26/2014   Procedure: LEFT HEART CATHETERIZATION WITH CORONARY ANGIOGRAM;  Surgeon: Jettie Booze, MD;  Location: [facility];  Service: Cardiovascular;  Laterality: N/A;   OVARIAN CYST REMOVAL Right 1999   PVC ABLATION N/A 12/01/2017   Procedure: PVC ABLATION;  Surgeon: Evans Lance, MD;  Location: [facility];  Service: Cardiovascular;  Laterality: N/A;   RIGHT HEART CATH N/A 08/05/2017   Procedure: RIGHT HEART CATH;  Surgeon: Jolaine Artist, MD;  Location: [facility];  Service: Cardiovascular;  Laterality: N/A;   RIGHT HEART CATH N/A 11/16/2019   Procedure: RIGHT HEART CATH;  Surgeon: Jolaine Artist, MD;  Location: [facility];  Service: Cardiovascular;  Laterality: N/A;   TUBAL LIGATION  1999    Current Outpatient Medications  Medication Sig Dispense Refill   albuterol (PROAIR HFA) 108 (90 Base) MCG/ACT inhaler Inhale 1-2 puffs into the lungs every 6 (six) hours as needed for wheezing or shortness of breath. Follow-up appt is due must see provider for future refills 18 g 0   amiodarone (PACERONE) 200 MG tablet Take 1 tablet (200 mg total) by mouth daily. 90 tablet 3   budesonide-formoterol (SYMBICORT) 160-4.5 MCG/ACT inhaler Inhale 2 puffs into the lungs 2 (two) times daily as needed. Follow-up appt is due must see provider for future refills 10 g 0   diclofenac (VOLTAREN) 75 MG EC tablet Take 1 tablet (75 mg total) by mouth 2 (two) times daily as needed. 30 tablet 0   diclofenac Sodium (VOLTAREN) 1 % GEL Apply 4 g topically 4 (four) times daily. 300 g 6   dicyclomine (BENTYL) 20 MG tablet Take 1 tablet (20 mg total) by mouth daily as needed for spasms. 90 tablet 3   empagliflozin (JARDIANCE) 10 MG TABS tablet Take 1 tablet (10 mg total) by mouth daily. 30 tablet 3   fluticasone (FLONASE) 50 MCG/ACT nasal spray Place 2 sprays into both nostrils daily as needed for allergies. Follow-up appt is due  must see provider for future refills 16 g 0   gabapentin (NEURONTIN) 300 MG capsule Take 1 capsule (300 mg total) by mouth at bedtime as needed (back spasms). 90 capsule 3   ipratropium-albuterol (DUONEB) 0.5-2.5 (3) MG/3ML SOLN Take 3 mLs by nebulization every 6 (six) hours as needed. 360 mL 0   ivabradine (CORLANOR) 7.5 MG TABS tablet Take 1 tablet (7.5 mg total) by mouth 2 (two) times daily with a meal. 60 tablet 2   Levonorgestrel-Ethinyl Estradiol (AMETHIA) 0.15-0.03 &0.01 MG tablet Take 1 tablet by mouth daily. 91 tablet 4   lidocaine (LIDODERM) 5 % Place 1 patch onto the skin daily. Remove & Discard patch within 12 hours or as directed by MD 90 patch 3   Magnesium Oxide 200 MG TABS Take 1 tablet (200 mg total) by mouth daily. 90 tablet 3   metoprolol succinate (TOPROL-XL) 50 MG 24 hr tablet Take 1 tablet (50 mg total) by mouth daily. Take with or immediately following a meal. 30  tablet 3   Multiple Vitamins-Minerals (MULTIVITAMIN GUMMIES WOMENS PO) Take 2 tablets by mouth at bedtime.      nitroGLYCERIN (NITROSTAT) 0.4 MG SL tablet Place 1 tablet (0.4 mg total) under the tongue every 5 (five) minutes as needed for chest pain. 75 tablet 0   omeprazole (PRILOSEC) 20 MG capsule Take 1 capsule (20 mg total) by mouth daily as needed (for reflux symptoms). 30 capsule 0   POTASSIUM CHLORIDE PO Take 20 mEq by mouth daily in the afternoon.     sacubitril-valsartan (ENTRESTO) 49-51 MG Take 1 tablet by mouth 2 (two) times daily. 60 tablet 6   spironolactone (ALDACTONE) 25 MG tablet Take 1 tablet (25 mg total) by mouth at bedtime. 90 tablet 3   tiZANidine (ZANAFLEX) 4 MG tablet Take 4 mg by mouth every 8 (eight) hours as needed for muscle spasms.      torsemide (DEMADEX) 20 MG tablet Take 20 mg by mouth daily.     Vitamin D, Ergocalciferol, (DRISDOL) 1.25 MG (50000 UNIT) CAPS capsule Take 1 capsule (50,000 Units total) by mouth every 7 (seven) days. 12 capsule 0   No current facility-administered  medications for this visit.    Allergies:   Vancomycin, Aspirin, Contrast media [iodinated diagnostic agents], Cyclobenzaprine, Ciprofloxacin, Farxiga [dapagliflozin], and Sulfa antibiotics   Social History: Social History   Socioeconomic History   Marital status: Married    Spouse name: Not on file   Number of children: 1   Years of education: Not on file   Highest education level: Not on file  Occupational History   Occupation: stay at home mom  Tobacco Use   Smoking status: Never   Smokeless tobacco: Never  Vaping Use   Vaping Use: Never used  Substance and Sexual Activity   Alcohol use: No   Drug use: No   Sexual activity: Yes  Other Topics Concern   Not on file  Social History Narrative   Not on file   Social Determinants of Health   Financial Resource Strain: Not on file  Food Insecurity: Not on file  Transportation Needs: Not on file  Physical Activity: Not on file  Stress: Not on file  Social Connections: Not on file  Intimate Partner Violence: Not on file    Family History: Family History  Problem Relation Age of Onset   Emphysema Maternal Grandmother        smoked   Heart disease Maternal Grandmother 48       MI   Rheum arthritis Mother    Allergies Daughter    Colon cancer Neg Hx      Review of Systems: All other systems reviewed and are otherwise negative except as noted above.  Physical Exam: There were no vitals filed for this visit.   GEN- The patient is well appearing, alert and oriented x 3 today.   HEENT: normocephalic, atraumatic; sclera clear, conjunctiva pink; hearing intact; oropharynx clear; neck supple  Lungs- Clear to ausculation bilaterally, normal work of breathing.  No wheezes, rales, rhonchi Heart- Regular rate and rhythm, no murmurs, rubs or gallops  GI- soft, non-tender, non-distended, bowel sounds present  Extremities- no clubbing or cyanosis. No edema MS- no significant deformity or atrophy Skin- warm and dry, no rash  or lesion; PPM pocket well healed Psych- euthymic mood, full affect Neuro- strength and sensation are intact  PPM Interrogation- reviewed in detail today,  See PACEART report  EKG:  EKG is not ordered today.  Recent Labs: 08/25/2020: B  Natriuretic Peptide 301.6 08/26/2020: Magnesium 2.1 02/04/2021: NT-Pro BNP 857; TSH 2.850 02/25/2021: ALT 10; BUN 11; Creatinine, Ser 1.03; Hemoglobin 14.3; Platelets 182; Potassium 4.0; Sodium 138   Wt Readings from Last 3 Encounters:  02/28/21 210 lb 3.2 oz (95.3 kg)  02/25/21 212 lb 11.2 oz (96.5 kg)  02/04/21 214 lb 12.8 oz (97.4 kg)     Other studies Reviewed: Additional studies/ records that were reviewed today include: Previous EP office notes, Previous remote checks, Most recent labwork.   Assessment and Plan:  1. Chronic systolic CHF s/p [employer] and Barostim implantation Echo 10/03/2020 LVEF 30-35% NYHA *** symptoms Device titrated from 4.0 millamp to 5.0 milliamp by increments of 0.4 with stable BP. Pt had stim felt as a "sticking" sensation when swallowing at 5.6. Marland Kitchen  Device impedence stable. Pt goals are to improve fatigue, take tap lessons/dance class 2x a week.  Normal device function See scanned report. Will follow up in 3-4 weeks to continue titration with goal of 6-8 milliamps for chronic settings.    2. Frequent PVCs Not candidate for re-do ablation due to location of her PVCs Recent Zio patch with PVC burden ~13%  Optimize GDMT and BB as tolerated. Recently, BP has been low.  Barostim titration has not been associated with statistically significant hypotension, but if needed, we can turn her back down.  Currently on amiodarone 200 mg daily. She has no other good AAD options.   Current medicines are reviewed at length with the patient today.    Labs/ tests ordered today include: *** No orders of the defined types were placed in this encounter.  Disposition:   Follow up with {Blank single:19197::"Dr. Allred","Dr.  Arlan Organ. Klein","Dr. Camnitz","Dr. Lambert","EP APP"} in {Blank single:19197::"2 weeks","4 weeks","3 months","6 months","12 months","as usual post gen change"}    Signed, Shirley Friar, PA-C  03/14/2021 12:06 PM  [place] [address] [phone] (office) [phone] (fax)

## 2021-03-17 ENCOUNTER — Encounter: Payer: HMO | Admitting: Student

## 2021-03-17 DIAGNOSIS — I493 Ventricular premature depolarization: Secondary | ICD-10-CM

## 2021-03-17 DIAGNOSIS — R5383 Other fatigue: Secondary | ICD-10-CM

## 2021-03-17 DIAGNOSIS — I5042 Chronic combined systolic (congestive) and diastolic (congestive) heart failure: Secondary | ICD-10-CM

## 2021-03-19 ENCOUNTER — Ambulatory Visit (INDEPENDENT_AMBULATORY_CARE_PROVIDER_SITE_OTHER): Payer: HMO

## 2021-03-19 DIAGNOSIS — I5042 Chronic combined systolic (congestive) and diastolic (congestive) heart failure: Secondary | ICD-10-CM

## 2021-03-22 LAB — CUP PACEART REMOTE DEVICE CHECK
Battery Remaining Longevity: 126 mo
Battery Remaining Percentage: 91 %
Brady Statistic RV Percent Paced: 0 %
Date Time Interrogation Session: 20221026045100
HighPow Impedance: 70 Ohm
Implantable Lead Implant Date: 20180312
Implantable Lead Location: 753860
Implantable Lead Model: 293
Implantable Lead Serial Number: 422842
Implantable Pulse Generator Implant Date: 20180312
Lead Channel Impedance Value: 603 Ohm
Lead Channel Setting Pacing Amplitude: 2.5 V
Lead Channel Setting Pacing Pulse Width: 0.4 ms
Lead Channel Setting Sensing Sensitivity: 0.5 mV
Pulse Gen Serial Number: 226601

## 2021-03-24 NOTE — Progress Notes (Signed)
Remote ICD transmission.   

## 2021-04-01 NOTE — Progress Notes (Signed)
Electrophysiology Office Note Date: 04/03/2021  ID:  Megan, [name] [birth year], MRN [id number]  PCP: Hoyt Koch, MD Primary Cardiologist: Sinclair Grooms, MD Electrophysiologist: Cristopher Peru, MD   CC: Pacemaker follow-up  [name] is a 45 y.o. female seen today for Cristopher Peru, MD for routine electrophysiology followup.  Since last being seen in our clinic the patient reports doing well. She has had a "sticking" sensation and intermittent difficulty swallowing since last barostim titration. She didn't take any of her medications for a couple of days and didn't have any issues, and it recurred when she restarted them, so she also wonders if it is a med reaction. She has a buzzing in her neck with turning to the right. She also has it positionally with yawning, laughing, or swallowing. Her energy level remains greatly improved. Prior to barostim she would be exhausted after 1 department store. She recently had a shopping trip with friends and went to 4 separate stores without issue. She is still not full where she would like to be, but is pleased with the progress.   Device History: Device History: Barostim (standard) implanted 11/08/20 for Chronic systolic CHF Medtronic Single Chamber ICD implanted 2018 for NICM  Past Medical History:  Diagnosis Date   Acute on chronic systolic (congestive) heart failure ([place]) 04/29/2017   Acute pain of right shoulder 06/16/2017   AICD (automatic cardioverter/defibrillator) present    Anginal pain (HCC)    Anxiety    Arthritis    right shoulder    Asthma    CHF (congestive heart failure) ([place])    Chronic combined systolic and diastolic heart failure ([place]) 03/05/2014   Closed low lateral malleolus fracture 10/23/2013   Cystitis 10/21/2017   Depression    Depression with anxiety 01/20/2013   Diabetes mellitus without complication (HCC)    Diverticulosis    Dyspnea    comes and goes intermittently mostly with exertion     Dysrhythmia    Essential hypertension    Prev followed by [name]    Fibroid    age 21   Gallstones    Generalized abdominal cramping    History of cardiomyopathy    Hypertension    IBS (irritable bowel syndrome)    ICD (implantable cardioverter-defibrillator), single, 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   Insomnia 05/07/2017   Labral tear of shoulder 04/04/2015    Injected 04/04/2015 Injected 12/03/2015    Migraine    "monthly" (08/03/2016)   Myofascial pain 06/16/2017   NICM (nonischemic cardiomyopathy) ([place]) 08/03/2016   Nonallopathic lesion of lumbosacral region 11/16/2016   Nonallopathic lesion of sacral region 11/16/2016   Nonallopathic lesion of thoracic region 08/20/2014   Nonspecific chest pain 04/28/2017   OSA (obstructive sleep apnea) 01/02/2013   NPSG 2009:  AHI 9/hr. CPAP intolerance >> "smothering" Good tolerance of auto device (optimal pressure 12-13 on download).  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   PVC (premature ventricular contraction) 06/23/2016   Seizures ([place])    "as a child" (08/03/2016)   Termination of pregnancy    due to cardiac risk   Past Surgical History:  Procedure Laterality Date   CARDIAC CATHETERIZATION N/A 11/11/2015   Procedure: Right/Left Heart Cath and Coronary Angiography;  Surgeon: Troy Sine, MD;  Location: [facility];  Service: Cardiovascular;  Laterality: N/A;   CARDIAC CATHETERIZATION  ~ 2015   CARDIAC DEFIBRILLATOR PLACEMENT  08/03/2016   CESAREAN SECTION  1999   COLONOSCOPY WITH PROPOFOL N/A 04/21/2016   Procedure: COLONOSCOPY WITH PROPOFOL;  Surgeon: Jerene Bears, MD;  Location: [facility];  Service: Gastroenterology;  Laterality: N/A;   ESOPHAGOGASTRODUODENOSCOPY (EGD) WITH PROPOFOL N/A 04/21/2016   Procedure: ESOPHAGOGASTRODUODENOSCOPY (EGD) WITH PROPOFOL;   Surgeon: Jerene Bears, MD;  Location: [facility];  Service: Gastroenterology;  Laterality: N/A;   FOOT FRACTURE SURGERY Right ~ 2003   FRACTURE SURGERY     ICD IMPLANT N/A 08/03/2016   Procedure: ICD Implant;  Surgeon: Deboraha Sprang, MD;  Location: [facility] [facility];  Service: Cardiovascular;  Laterality: N/A;   LAPAROSCOPIC CHOLECYSTECTOMY  12/2006   LAPAROSCOPIC GASTRIC SLEEVE RESECTION     LAPAROSCOPY ABDOMEN DIAGNOSTIC  2008   "cut bile duct w/gallbladder OR; had to go in later & fix leak; hospitalized for 2 months"   LEFT HEART CATHETERIZATION WITH CORONARY ANGIOGRAM N/A 02/26/2014   Procedure: LEFT HEART CATHETERIZATION WITH CORONARY ANGIOGRAM;  Surgeon: Jettie Booze, MD;  Location[facility];  Service: Cardiovascular;  Laterality: N/A;   OVARIAN CYST REMOVAL Right 1999   PVC ABLATION N/A 12/01/2017   Procedure: PVC ABLATION;  Surgeon: Evans Lance, MD;  Location: [place] [facility];  Service: Cardiovascular;  Laterality: N/A;   RIGHT HEART CATH N/A 08/05/2017   Procedure: RIGHT HEART CATH;  Surgeon: Jolaine Artist, MD;  Location: [facility] [facility];  Service: Cardiovascular;  Laterality: N/A;   RIGHT HEART CATH N/A 11/16/2019   Procedure: RIGHT HEART CATH;  Surgeon: Jolaine Artist, MD;  Location: [facility];  Service: Cardiovascular;  Laterality: N/A;   TUBAL LIGATION  1999    Current Outpatient Medications  Medication Sig Dispense Refill   albuterol (PROAIR HFA) 108 (90 Base) MCG/ACT inhaler Inhale 1-2 puffs into the lungs every 6 (six) hours as needed for wheezing or shortness of breath. Follow-up appt is due must see provider for future refills 18 g 0   amiodarone (PACERONE) 200 MG tablet Take 1 tablet (200 mg total) by mouth daily. 90 tablet 3   budesonide-formoterol (SYMBICORT) 160-4.5 MCG/ACT inhaler Inhale 2 puffs into the lungs 2 (two) times daily as needed. Follow-up appt is due must see provider for future refills 10 g 0   diclofenac (VOLTAREN)  75 MG EC tablet Take 1 tablet (75 mg total) by mouth 2 (two) times daily as needed. 30 tablet 0   diclofenac Sodium (VOLTAREN) 1 % GEL Apply 4 g topically 4 (four) times daily. 300 g 6   dicyclomine (BENTYL) 20 MG tablet Take 1 tablet (20 mg total) by mouth daily as needed for spasms. 90 tablet 3   empagliflozin (JARDIANCE) 10 MG TABS tablet Take 1 tablet (10 mg total) by mouth daily. 30 tablet 3   fluticasone (FLONASE) 50 MCG/ACT nasal spray Place 2 sprays into both nostrils daily as needed for allergies. Follow-up appt is due must see provider for future refills 16 g 0   gabapentin (NEURONTIN) 300 MG capsule Take 1 capsule (300 mg total) by mouth at bedtime as needed (back spasms). 90 capsule 3   ipratropium-albuterol (DUONEB) 0.5-2.5 (3) MG/3ML SOLN Take 3 mLs by nebulization every 6 (six) hours as needed. 360 mL 0   ivabradine (CORLANOR) 7.5 MG TABS tablet Take 1 tablet (7.5 mg total) by mouth  2 (two) times daily with a meal. 60 tablet 2   Levonorgestrel-Ethinyl Estradiol (AMETHIA) 0.15-0.03 &0.01 MG tablet Take 1 tablet by mouth daily. 91 tablet 4   lidocaine (LIDODERM) 5 % Place 1 patch onto the skin daily. Remove & Discard patch within 12 hours or as directed by MD 90 patch 3   Magnesium Oxide 200 MG TABS Take 1 tablet (200 mg total) by mouth daily. 90 tablet 3   metoprolol succinate (TOPROL-XL) 50 MG 24 hr tablet Take 1 tablet (50 mg total) by mouth daily. Take with or immediately following a meal. 30 tablet 3   Multiple Vitamins-Minerals (MULTIVITAMIN GUMMIES WOMENS PO) Take 2 tablets by mouth at bedtime.      nitroGLYCERIN (NITROSTAT) 0.4 MG SL tablet Place 1 tablet (0.4 mg total) under the tongue every 5 (five) minutes as needed for chest pain. 75 tablet 0   omeprazole (PRILOSEC) 20 MG capsule Take 1 capsule (20 mg total) by mouth daily as needed (for reflux symptoms). 30 capsule 0   POTASSIUM CHLORIDE PO Take 20 mEq by mouth daily in the afternoon.     sacubitril-valsartan (ENTRESTO)  49-51 MG Take 1 tablet by mouth 2 (two) times daily. 60 tablet 6   spironolactone (ALDACTONE) 25 MG tablet Take 1 tablet (25 mg total) by mouth at bedtime. 90 tablet 3   tiZANidine (ZANAFLEX) 4 MG tablet Take 4 mg by mouth every 8 (eight) hours as needed for muscle spasms.      torsemide (DEMADEX) 20 MG tablet Take 20 mg by mouth daily.     Vitamin D, Ergocalciferol, (DRISDOL) 1.25 MG (50000 UNIT) CAPS capsule Take 1 capsule (50,000 Units total) by mouth every 7 (seven) days. 12 capsule 0   No current facility-administered medications for this visit.    Allergies:   Vancomycin, Aspirin, Contrast media [iodinated diagnostic agents], Cyclobenzaprine, Ciprofloxacin, Farxiga [dapagliflozin], and Sulfa antibiotics   Social History: Social History   Socioeconomic History   Marital status: Married    Spouse name: Not on file   Number of children: 1   Years of education: Not on file   Highest education level: Not on file  Occupational History   Occupation: stay at home mom  Tobacco Use   Smoking status: Never   Smokeless tobacco: Never  Vaping Use   Vaping Use: Never used  Substance and Sexual Activity   Alcohol use: No   Drug use: No   Sexual activity: Yes  Other Topics Concern   Not on file  Social History Narrative   Not on file   Social Determinants of Health   Financial Resource Strain: Not on file  Food Insecurity: Not on file  Transportation Needs: Not on file  Physical Activity: Not on file  Stress: Not on file  Social Connections: Not on file  Intimate Partner Violence: Not on file    Family History: Family History  Problem Relation Age of Onset   Emphysema Maternal Grandmother        smoked   Heart disease Maternal Grandmother 108       MI   Rheum arthritis Mother    Allergies Daughter    Colon cancer Neg Hx      Review of Systems: All other systems reviewed and are otherwise negative except as noted above.  Physical Exam: Vitals:   04/03/21 1008   BP: 140/78  Pulse: (!) 53  SpO2: 97%  Weight: 215 lb (97.5 kg)  Height: 5\' 5"  (1.651 m)  GEN- The patient is well appearing, alert and oriented x 3 today.   HEENT: normocephalic, atraumatic; sclera clear, conjunctiva pink; hearing intact; oropharynx clear; neck supple  Lungs- Clear to ausculation bilaterally, normal work of breathing.  No wheezes, rales, rhonchi Heart- Regular rate and rhythm, no murmurs, rubs or gallops  GI- soft, non-tender, non-distended, bowel sounds present  Extremities- no clubbing or cyanosis. No edema MS- no significant deformity or atrophy Skin- warm and dry, no rash or lesion; PPM pocket well healed Psych- euthymic mood, full affect Neuro- strength and sensation are intact  PPM Interrogation- reviewed in detail today,  See PACEART report  EKG:  EKG is not ordered today.  Recent Labs: 08/25/2020: B Natriuretic Peptide 301.6 08/26/2020: Magnesium 2.1 02/04/2021: NT-Pro BNP 857; TSH 2.850 02/25/2021: ALT 10; BUN 11; Creatinine, Ser 1.03; Hemoglobin 14.3; Platelets 182; Potassium 4.0; Sodium 138   Wt Readings from Last 3 Encounters:  04/03/21 215 lb (97.5 kg)  02/28/21 210 lb 3.2 oz (95.3 kg)  02/25/21 212 lb 11.2 oz (96.5 kg)     Other studies Reviewed: Additional studies/ records that were reviewed today include: Previous EP office notes, Previous remote checks, Most recent labwork.   Assessment and Plan:  1. Chronic systolic CHF s/p [employer] and Barostim implantation Echo 10/03/2020 LVEF 30-35% NYHA II-III symptoms Device titrated from 5.0 ma to 4.0 ma in the setting of intermittent, but reproducible stim.  Device impedence stable. Pt goals are to improve fatigue, take tap lessons/dance class 2x a week. She is progressing toward this Normal device function See scanned report. F/u in 2 months to consider up-titration, but for now will not increase.    2. Frequent PVCs Not candidate for re-do ablation due to location of her PVCs Recent  Zio patch with PVC burden ~13%  Optimize GDMT and BB as tolerated. Recently, BP has been low.  Barostim titration has not been associated with statistically significant hypotension, but if needed, we can turn her back down.  Currently on amiodarone 200 mg daily. She has no other good AAD options.   3. Intermittent dysphagia Suspect this may have been a product of stim from her device. If this does not improve with downtitration (she had no reproducible stim after change) need to consider possible med allergy.  She denies wheezing, acute SOB, or any visible facial swelling/edema.   Current medicines are reviewed at length with the patient today.    Labs/ tests ordered today include:   Disposition:   Follow up with EP APP in  2 months     Signed, Shirley Friar, PA-C  04/03/2021 10:22 AM  [place] [address] [place] [address] [phone] (office) [phone] (fax)

## 2021-04-03 ENCOUNTER — Other Ambulatory Visit: Payer: Self-pay

## 2021-04-03 ENCOUNTER — Ambulatory Visit (INDEPENDENT_AMBULATORY_CARE_PROVIDER_SITE_OTHER): Payer: HMO | Admitting: Student

## 2021-04-03 ENCOUNTER — Encounter: Payer: Self-pay | Admitting: Student

## 2021-04-03 VITALS — BP 140/78 | HR 53 | Ht 65.0 in | Wt 215.0 lb

## 2021-04-03 DIAGNOSIS — I5042 Chronic combined systolic (congestive) and diastolic (congestive) heart failure: Secondary | ICD-10-CM

## 2021-04-03 DIAGNOSIS — I493 Ventricular premature depolarization: Secondary | ICD-10-CM | POA: Diagnosis not present

## 2021-04-03 NOTE — Patient Instructions (Signed)

## 2021-04-07 ENCOUNTER — Ambulatory Visit (INDEPENDENT_AMBULATORY_CARE_PROVIDER_SITE_OTHER): Payer: HMO

## 2021-04-07 DIAGNOSIS — I5042 Chronic combined systolic (congestive) and diastolic (congestive) heart failure: Secondary | ICD-10-CM

## 2021-04-07 DIAGNOSIS — Z95810 Presence of automatic (implantable) cardiac defibrillator: Secondary | ICD-10-CM

## 2021-04-08 ENCOUNTER — Telehealth: Payer: Self-pay

## 2021-04-08 NOTE — Progress Notes (Signed)
EPIC Encounter for ICM Monitoring  Patient Name: [name] is a 45 y.o. female Date: 04/08/2021 Primary Care Physican: Hoyt Koch, MD Primary Cardiologist: Bensimhon Electrophysiologist: Caryl Comes 04/03/2021 Office Weight: 215 lbs                                                             Attempted call to patient and unable to reach.  Left detailed message per DPR regarding transmission. Transmission reviewed.    Heartlogic HF Index 28 suggesting possible fluid accumulation starting 10/31. Thoracic impedance trending down.   Barostim implant 6/27.   Prescribed: Torsemide 20 mg Take 1 tablet  (20 mg total) by mouth daily.  Potassium 20 mEq take 1 tablet daily Spironolactone 25 mg take 1 tablet (25 mg total) at bedtime.    Labs: 02/25/2021 Creatinine 1.03, BUN 11, Potassium 4.0, Sodium 138 02/04/2021 Creatinine 0.94, BUN 7,   Potassium 3.4, Sodium 137 12/24/2020 Creatinine 0.80, BUN 7,   Potassium 3.6, Sodium 136, GFR >60 11/29/2020 Creatinine 0.77, BUN 5,   Potassium 3.1, Sodium 138, GFR >60 11/06/2020 Creatinine 0.78, BUN <5, Potassium 3.3, Sodium 137, GFR >60 10/16/2020 Creatinine 0.86, BUN 10, Potassium 3.7, Sodium 142, GFR >60  A complete set of results can be found in Results Review.   Recommendations:  Left voice mail with ICM number and encouraged to call if experiencing any fluid symptoms.  If patient reached will advise to increase Torsemide to 2 tablets x 3 days.    Follow-up plan: ICM clinic phone appointment on 04/14/2021 to recheck.   91 day device clinic remote transmission 06/09/2021.              EP/Cardiology next office visit:   04/24/2021 with Dr Haroldine Laws.  06/18/2021 with Oda Kilts, PA.         Copy of ICM check sent to Dr. Caryl Comes.  Will send to Dr Haroldine Laws for review if patient is reached.    3 Month Trend    8 Day Data Trend          [name], RN 04/08/2021 11:02 AM

## 2021-04-08 NOTE — Telephone Encounter (Signed)
Remote ICM transmission received.  Attempted call to patient regarding ICM remote transmission and left detailed message per DPR.  Advised to return call for any fluid symptoms or questions. Next ICM remote transmission scheduled 04/14/2021.

## 2021-04-11 ENCOUNTER — Encounter: Payer: Self-pay | Admitting: Family Medicine

## 2021-04-11 ENCOUNTER — Ambulatory Visit: Payer: HMO | Admitting: Family Medicine

## 2021-04-11 ENCOUNTER — Other Ambulatory Visit: Payer: Self-pay

## 2021-04-11 DIAGNOSIS — M25511 Pain in right shoulder: Secondary | ICD-10-CM | POA: Diagnosis not present

## 2021-04-11 DIAGNOSIS — M25531 Pain in right wrist: Secondary | ICD-10-CM

## 2021-04-11 NOTE — Assessment & Plan Note (Signed)
Repeat injection given again today.  Chronic problem with exacerbation.  Patient is doing a lot more repetitive activity and I think this is contributed to more of the discomfort and pain.  Discussed icing regimen and home exercise, discussed which activities to do which wants to avoid.  Patient has been to continue to make progress with changes in his ergonomics.  Does have topical anti-inflammatories, lidocaine patch as well as the gabapentin.  Follow-up with me again in 4 to 6 weeks

## 2021-04-11 NOTE — Patient Instructions (Addendum)
Do prescribed exercises at least 3x a week  See you again in 2 months

## 2021-04-11 NOTE — Progress Notes (Signed)
[facility] [place] Phone: [phone] Subjective:    I'm seeing this patient by the request  of:  Hoyt Koch, MD  CC: Right sided neck and arm pain  [id number]  12/26/2020 Patient given injection and tolerated the procedure well.  Discussed icing regimen and home exercises.  Discussed icing regimen.  Patient follow-up with me again 6 to 8 weeks after starting with home therapy.  Avoided HVLA or other manipulation secondary to patient's recent surgical intervention.  Updated 04/11/2021 [name] is a 45 y.o. female coming in with complaint of right side shoulder neck and wrist. Shoulder pain sharp and chronic, more trapezius. Neck is towards traps as well. Wrist pain dorsal and palmar side. Started about 3 weeks ago. Knitting a lot for the holidays, achy pains. Heat and Ice, pennsaid, lidocaine patches, temporary relief.       Past Medical History:  Diagnosis Date   Acute on chronic systolic (congestive) heart failure (HCC) 04/29/2017   Acute pain of right shoulder 06/16/2017   AICD (automatic cardioverter/defibrillator) present    Anginal pain (HCC)    Anxiety    Arthritis    right shoulder    Asthma    CHF (congestive heart failure) ([place])    Chronic combined systolic and diastolic heart failure ([place]) 03/05/2014   Closed low lateral malleolus fracture 10/23/2013   Cystitis 10/21/2017   Depression    Depression with anxiety 01/20/2013   Diabetes mellitus without complication (HCC)    Diverticulosis    Dyspnea    comes and goes intermittently mostly with exertion    Dysrhythmia    Essential hypertension    Prev followed by [facility]/ Cardiology    Fibroid    age 18   Gallstones    Generalized abdominal cramping    History of cardiomyopathy    Hypertension    IBS (irritable bowel syndrome)    ICD (implantable cardioverter-defibrillator), single, 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 CPAP intolerance >> "smothering" Good tolerance of auto device (optimal pressure 12-13 on download).  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   PVC (premature ventricular contraction) 06/23/2016   Seizures ([place])    "as a child" (08/03/2016)   Termination of pregnancy    due to cardiac risk   Past Surgical History:  Procedure Laterality Date   CARDIAC CATHETERIZATION N/A 11/11/2015   Procedure: Right/Left Heart Cath and Coronary Angiography;  Surgeon: Troy Sine, MD;  Location: [facility];  Service: Cardiovascular;  Laterality: N/A;   CARDIAC CATHETERIZATION  ~ 2015   CARDIAC DEFIBRILLATOR PLACEMENT  08/03/2016   CESAREAN SECTION  1999   COLONOSCOPY WITH PROPOFOL N/A 04/21/2016   Procedure: COLONOSCOPY WITH PROPOFOL;  Surgeon: Jerene Bears, MD;  Location: [facility];  Service: Gastroenterology;  Laterality: N/A;   ESOPHAGOGASTRODUODENOSCOPY (EGD) WITH PROPOFOL N/A 04/21/2016   Procedure: ESOPHAGOGASTRODUODENOSCOPY (EGD) WITH PROPOFOL;  Surgeon: Jerene Bears, MD;  Location: [facility];  Service: Gastroenterology;  Laterality: N/A;   FOOT FRACTURE SURGERY Right ~ 2003   FRACTURE SURGERY     ICD IMPLANT N/A 08/03/2016   Procedure: ICD Implant;  Surgeon: Deboraha Sprang, MD;  Location: [facility];  Service: Cardiovascular;  Laterality: N/A;   LAPAROSCOPIC  CHOLECYSTECTOMY  12/2006   LAPAROSCOPIC GASTRIC SLEEVE RESECTION     LAPAROSCOPY ABDOMEN DIAGNOSTIC  2008   "cut bile duct w/gallbladder OR; had to go in later & fix leak; hospitalized for 2 months"   LEFT HEART CATHETERIZATION WITH CORONARY ANGIOGRAM N/A 02/26/2014   Procedure: LEFT HEART CATHETERIZATION WITH CORONARY ANGIOGRAM;  Surgeon: Jettie Booze, MD;  Location: [facility];  Service: Cardiovascular;  Laterality: N/A;   OVARIAN CYST REMOVAL Right 1999   PVC ABLATION N/A 12/01/2017   Procedure: PVC ABLATION;  Surgeon: Evans Lance, MD;  Location: [place];  Service: Cardiovascular;  Laterality: N/A;   RIGHT HEART CATH N/A 08/05/2017   Procedure: RIGHT HEART CATH;  Surgeon: Jolaine Artist, MD;  Location: [facility];  Service: Cardiovascular;  Laterality: N/A;   RIGHT HEART CATH N/A 11/16/2019   Procedure: RIGHT HEART CATH;  Surgeon: Jolaine Artist, MD;  Location: [facility];  Service: Cardiovascular;  Laterality: N/A;   TUBAL LIGATION  1999   Social History   Socioeconomic History   Marital status: Married    Spouse name: Not on file   Number of children: 1   Years of education: Not on file   Highest education level: Not on file  Occupational History   Occupation: stay at home mom  Tobacco Use   Smoking status: Never   Smokeless tobacco: Never  Vaping Use   Vaping Use: Never used  Substance and Sexual Activity   Alcohol use: No   Drug use: No   Sexual activity: Yes  Other Topics Concern   Not on file  Social History Narrative   Not on file   Social Determinants of Health   Financial Resource Strain: Not on file  Food Insecurity: Not on file  Transportation Needs: Not on file  Physical Activity: Not on file  Stress: Not on file  Social Connections: Not on file   Allergies  Allergen Reactions   Vancomycin Other (See Comments)    "did something to my kidneys," PROGRESSED TO KIDNEY FAILURE!!   Aspirin Other (See Comments)     Wheezing, (Pt states that she just wheezes some when she takes aspirin by itself but she can take aspirin in a combination product).   Contrast Media [Iodinated Diagnostic Agents] Other (See Comments)    Multiple CT contrast studies done over 2 weeks caused ARF   Cyclobenzaprine Other (See Comments)    Can not tolerate   Ciprofloxacin Itching and Rash   Farxiga [Dapagliflozin] Rash   Sulfa Antibiotics Itching and Rash   Family History  Problem Relation Age of Onset   Emphysema Maternal Grandmother        smoked   Heart disease Maternal Grandmother 73       MI   Rheum arthritis Mother    Allergies Daughter    Colon cancer Neg Hx     Current Outpatient Medications (Endocrine & Metabolic):    empagliflozin (JARDIANCE) 10 MG TABS tablet, Take  1 tablet (10 mg total) by mouth daily.   Levonorgestrel-Ethinyl Estradiol (AMETHIA) 0.15-0.03 &0.01 MG tablet, Take 1 tablet by mouth daily.  Current Outpatient Medications (Cardiovascular):    amiodarone (PACERONE) 200 MG tablet, Take 1 tablet (200 mg total) by mouth daily.   ivabradine (CORLANOR) 7.5 MG TABS tablet, Take 1 tablet (7.5 mg total) by mouth 2 (two) times daily with a meal.   metoprolol succinate (TOPROL-XL) 50 MG 24 hr tablet, Take 1 tablet (50 mg total) by mouth daily. Take with or immediately following a meal.   nitroGLYCERIN (NITROSTAT) 0.4 MG SL tablet, Place 1 tablet (0.4 mg total) under the tongue every 5 (five) minutes as needed for chest pain.   sacubitril-valsartan (ENTRESTO) 49-51 MG, Take 1 tablet by mouth 2 (two) times daily.   spironolactone (ALDACTONE) 25 MG tablet, Take 1 tablet (25 mg total) by mouth at bedtime.   torsemide (DEMADEX) 20 MG tablet, Take 20 mg by mouth daily.  Current Outpatient Medications (Respiratory):    albuterol (PROAIR HFA) 108 (90 Base) MCG/ACT inhaler, Inhale 1-2 puffs into the lungs every 6 (six) hours as needed for wheezing or shortness of breath. Follow-up appt is due must see provider  for future refills   budesonide-formoterol (SYMBICORT) 160-4.5 MCG/ACT inhaler, Inhale 2 puffs into the lungs 2 (two) times daily as needed. Follow-up appt is due must see provider for future refills   fluticasone (FLONASE) 50 MCG/ACT nasal spray, Place 2 sprays into both nostrils daily as needed for allergies. Follow-up appt is due must see provider for future refills   ipratropium-albuterol (DUONEB) 0.5-2.5 (3) MG/3ML SOLN, Take 3 mLs by nebulization every 6 (six) hours as needed.  Current Outpatient Medications (Analgesics):    diclofenac (VOLTAREN) 75 MG EC tablet, Take 1 tablet (75 mg total) by mouth 2 (two) times daily as needed.   Current Outpatient Medications (Other):    diclofenac Sodium (VOLTAREN) 1 % GEL, Apply 4 g topically 4 (four) times daily.   dicyclomine (BENTYL) 20 MG tablet, Take 1 tablet (20 mg total) by mouth daily as needed for spasms.   gabapentin (NEURONTIN) 300 MG capsule, Take 1 capsule (300 mg total) by mouth at bedtime as needed (back spasms).   lidocaine (LIDODERM) 5 %, Place 1 patch onto the skin daily. Remove & Discard patch within 12 hours or as directed by MD   Magnesium Oxide 200 MG TABS, Take 1 tablet (200 mg total) by mouth daily.   Multiple Vitamins-Minerals (MULTIVITAMIN GUMMIES WOMENS PO), Take 2 tablets by mouth at bedtime.    omeprazole (PRILOSEC) 20 MG capsule, Take 1 capsule (20 mg total) by mouth daily as needed (for reflux symptoms).   POTASSIUM CHLORIDE PO, Take 20 mEq by mouth daily in the afternoon.   tiZANidine (ZANAFLEX) 4 MG tablet, Take 4 mg by mouth every 8 (eight) hours as needed for muscle spasms.    Vitamin D, Ergocalciferol, (DRISDOL) 1.25 MG (50000 UNIT) CAPS capsule, Take 1 capsule (50,000 Units total) by mouth every 7 (seven) days.   Reviewed prior external information including notes and imaging from  primary care provider As well as notes that were available from care everywhere and other healthcare systems.  Past medical  history, social, surgical and family history all reviewed in electronic medical record.  No pertanent information unless stated regarding to the chief complaint.   Review of Systems:  No headache, visual changes, nausea, vomiting, diarrhea, constipation, dizziness, abdominal pain, skin rash, fevers, chills, night sweats, weight loss, swollen lymph  nodes, body aches, joint swelling, chest pain, shortness of breath, mood changes. POSITIVE muscle aches  Objective  Blood pressure 118/74, height 5\' 5"  (1.651 m), weight 214 lb (97.1 kg), SpO2 98 %.   General: No apparent distress alert and oriented x3 mood and affect normal, dressed appropriately.  HEENT: Pupils equal, extraocular movements intact  Respiratory: [name] speak in full sentences and does not appear short of breath  Cardiovascular: No lower extremity edema, non tender, no erythema  Gait normal with good balance and coordination.  MSK: Right neck exam shows the patient does have some loss of lordosis.  Patient does have some tightness noted in the paraspinal musculature.  Patient also has significant tightness with multiple trigger points in the right shoulder region including the trapezius, rhomboid and latissimus dorsi.  Right wrist exam shows the patient does have tenderness with flexion and extension.  Patient has good grip strength noted.  Negative Tinel sign.  No significant swelling but on the dorsal aspect of the wrist proximately 3 cm proximal there is a lipoma noted that is freely movable and severely tender.   After verbal consent patient was prepped with alcohol swab and with a 25-gauge half inch needle injected into 4 distinct trigger points in the trapezius, rhomboid and latissimus dorsi.  A total of 3 cc of 0.5% Marcaine and 1 cc of Kenalog 40 mg/mL used.  Minimal blood loss.  Band-Aid placed.  Postinjection instructions given.    Impression and Recommendations:     The above documentation has been reviewed and is  accurate and complete Lyndal Pulley, DO

## 2021-04-11 NOTE — Assessment & Plan Note (Signed)
Seems to be more of a tendinitis.  Discussed with patient at great length.  Patient does have a lipoma overlying the area where she has discomfort but is freely movable and tender.  Patient aware of the place at night and given home exercises.  Discussed icing regimen.  Discussed other ergonomic changes that could be beneficial.  Follow-up again in 4 to 8 weeks and if worsening pain can consider potential injections or formal physical therapy

## 2021-04-14 ENCOUNTER — Ambulatory Visit (INDEPENDENT_AMBULATORY_CARE_PROVIDER_SITE_OTHER): Payer: HMO

## 2021-04-14 DIAGNOSIS — I5042 Chronic combined systolic (congestive) and diastolic (congestive) heart failure: Secondary | ICD-10-CM

## 2021-04-14 DIAGNOSIS — Z95810 Presence of automatic (implantable) cardiac defibrillator: Secondary | ICD-10-CM

## 2021-04-14 NOTE — Progress Notes (Signed)
EPIC Encounter for ICM Monitoring  Patient Name: [name] is a 45 y.o. female Date: 04/14/2021 Primary Care Physican: Hoyt Koch, MD Primary Cardiologist: Bensimhon Electrophysiologist: Caryl Comes 04/03/2021 Office Weight: 215 lbs     04/14/2021 Weight: 216 lbs                                                         Spoke with patient and heart failure questions reviewed.  Pt asymptomatic for fluid accumulation.  She reports over the last strong smelling    Heartlogic HF Index increased to 40 (was 24 on 11/12) suggesting possible fluid accumulation starting 10/31. Thoracic impedance trending down.   Barostim implant 6/27.   Prescribed: Torsemide 20 mg Take 1 tablet  (20 mg total) by mouth daily.  Potassium 20 mEq take 1 tablet daily Spironolactone 25 mg take 1 tablet (25 mg total) at bedtime.    Labs: 02/25/2021 Creatinine 1.03, BUN 11, Potassium 4.0, Sodium 138 02/04/2021 Creatinine 0.94, BUN 7,   Potassium 3.4, Sodium 137 12/24/2020 Creatinine 0.80, BUN 7,   Potassium 3.6, Sodium 136, GFR >60 11/29/2020 Creatinine 0.77, BUN 5,   Potassium 3.1, Sodium 138, GFR >60 11/06/2020 Creatinine 0.78, BUN <5, Potassium 3.3, Sodium 137, GFR >60 10/16/2020 Creatinine 0.86, BUN 10, Potassium 3.7, Sodium 142, GFR >60  A complete set of results can be found in Results Review.   Recommendations: She will take extra Torsemide and Potassium tablet x 3 days and then return to prescribed dosage.  Advised to limit salt and fluid intake.     Follow-up plan: ICM clinic phone appointment on 04/22/2021 to recheck fluid levels.   91 day device clinic remote transmission 06/09/2021.              EP/Cardiology next office visit:   04/24/2021 with Dr Haroldine Laws.  06/18/2021 with Oda Kilts, PA.         Copy of ICM check sent to Dr. Caryl Comes.  Copy to Dr Haroldine Laws for review.  3 Month Trend                 8 Day Data Trend          [name], RN 04/14/2021 9:17 AM

## 2021-04-15 ENCOUNTER — Ambulatory Visit: Payer: HMO | Admitting: Family Medicine

## 2021-04-20 ENCOUNTER — Other Ambulatory Visit: Payer: Self-pay | Admitting: Internal Medicine

## 2021-04-22 ENCOUNTER — Ambulatory Visit (INDEPENDENT_AMBULATORY_CARE_PROVIDER_SITE_OTHER): Payer: HMO

## 2021-04-22 ENCOUNTER — Encounter (HOSPITAL_COMMUNITY): Payer: Self-pay | Admitting: Internal Medicine

## 2021-04-22 ENCOUNTER — Telehealth (HOSPITAL_COMMUNITY): Payer: Self-pay | Admitting: Vascular Surgery

## 2021-04-22 DIAGNOSIS — I5042 Chronic combined systolic (congestive) and diastolic (congestive) heart failure: Secondary | ICD-10-CM

## 2021-04-22 DIAGNOSIS — Z95810 Presence of automatic (implantable) cardiac defibrillator: Secondary | ICD-10-CM

## 2021-04-22 NOTE — Telephone Encounter (Signed)
Pt sent mychart message to resch echo/DB appt 12/1, returned pt call lvm to have pt call back to resch appt

## 2021-04-22 NOTE — Progress Notes (Signed)
EPIC Encounter for ICM Monitoring  Patient Name: [name] is a 45 y.o. female Date: 04/22/2021 Primary Care Physican: Hoyt Koch, MD Primary Cardiologist: Bensimhon Electrophysiologist: Caryl Comes 04/03/2021 Office Weight: 215 lbs     04/14/2021 Weight: 216 lbs   04/22/2021 Weight: 206-208 lbs                                                       Spoke with patient and heart failure questions reviewed.  She lost 9 lbs after taking extra Torsemide 11/21-11/23.  She feels fine and does not feel like she has any fluid symptoms.     Heartlogic HF Index decreased to 39 (was 24 on 11/12) suggesting possible fluid accumulation improvement after taking 3 days of Lasix 40 mg w/extra K+. Thoracic impedance trending down.   Barostim implant 6/27.   Prescribed: Torsemide 20 mg Take 1 tablet  (20 mg total) by mouth daily.  Potassium 20 mEq take 1 tablet daily Spironolactone 25 mg take 1 tablet (25 mg total) at bedtime.    Labs: 02/25/2021 Creatinine 1.03, BUN 11, Potassium 4.0, Sodium 138 02/04/2021 Creatinine 0.94, BUN 7,   Potassium 3.4, Sodium 137 12/24/2020 Creatinine 0.80, BUN 7,   Potassium 3.6, Sodium 136, GFR >60 11/29/2020 Creatinine 0.77, BUN 5,   Potassium 3.1, Sodium 138, GFR >60 11/06/2020 Creatinine 0.78, BUN <5, Potassium 3.3, Sodium 137, GFR >60 10/16/2020 Creatinine 0.86, BUN 10, Potassium 3.7, Sodium 142, GFR >60  A complete set of results can be found in Results Review.   Recommendations: She will take extra Torsemide and Potassium tablet x 2 days and then return to prescribed dosage.  Advised to limit salt and fluid intake.  Pt has family emergency and leaves today.    Follow-up plan: ICM clinic phone appointment on 04/30/2021 to recheck fluid levels.   91 day device clinic remote transmission 06/18/2021.              EP/Cardiology next office visit:   06/24/2021 with Dr Haroldine Laws.  06/09/2021 with Oda Kilts, PA.         Copy of ICM check sent to Dr. Caryl Comes.  Copy  to Dr Haroldine Laws for review.  3 Month Trend    8 Day Data Trend          [name], RN 04/22/2021 9:39 AM

## 2021-04-24 ENCOUNTER — Encounter (HOSPITAL_COMMUNITY): Payer: HMO | Admitting: Internal Medicine

## 2021-04-24 ENCOUNTER — Ambulatory Visit (HOSPITAL_COMMUNITY): Payer: HMO

## 2021-04-24 ENCOUNTER — Encounter (HOSPITAL_COMMUNITY): Payer: Self-pay

## 2021-04-28 NOTE — Progress Notes (Signed)
[place] [facility] [place] Phone: [phone] Subjective:   [name], am serving as a scribe for Dr. Hulan Saas.  This visit occurred during the SARS-CoV-2 public health emergency.  Safety protocols were in place, including screening questions prior to the visit, additional usage of staff PPE, and extensive cleaning of exam room while observing appropriate contact time as indicated for disinfecting solutions.    I'm seeing this patient by the request  of:  Hoyt Koch, MD  CC: Hip and back pain follow-up  IRJ:[id number]  12/26/2020 Patient given injection and tolerated the procedure well.  Discussed icing regimen and home exercises.  Discussed icing regimen.  Patient follow-up with me again 6 to 8 weeks after starting with home therapy.  Avoided HVLA or other manipulation secondary to patient's recent surgical intervention.  Update 04/29/2021 [name] is a 45 y.o. female coming in with complaint of R hip pain. Patient states that her hip pain did improve but she went on a road trip and this caused an increase in her pain. Has to walk with R foot plantar flexed to alleviate pain. Pain in R glute and into R thigh. Using Tylenol for pain.  Patient feels though that the pain is coming more from the back this time.      Past Medical History:  Diagnosis Date   Acute on chronic systolic (congestive) heart failure (HCC) 04/29/2017   Acute pain of right shoulder 06/16/2017   AICD (automatic cardioverter/defibrillator) present    Anginal pain (HCC)    Anxiety    Arthritis    right shoulder    Asthma    CHF (congestive heart failure) ([place])    Chronic combined systolic and diastolic heart failure ([place]) 03/05/2014   Closed low lateral malleolus fracture 10/23/2013   Cystitis 10/21/2017   Depression    Depression with anxiety 01/20/2013   Diabetes mellitus without complication (HCC)    Diverticulosis    Dyspnea     comes and goes intermittently mostly with exertion    Dysrhythmia    Essential hypertension    Prev followed by [facility]    Fibroid    age 18   Gallstones    Generalized abdominal cramping    History of cardiomyopathy    Hypertension    IBS (irritable bowel syndrome)    ICD (implantable cardioverter-defibrillator), single, 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 CPAP intolerance >> "smothering" Good tolerance of auto device (optimal pressure 12-13 on download).  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   PVC (premature ventricular contraction) 06/23/2016   Seizures ([place])    "as a child" (08/03/2016)   Termination of pregnancy    due to cardiac risk   Past Surgical History:  Procedure Laterality Date   CARDIAC  CATHETERIZATION N/A 11/11/2015   Procedure: Right/Left Heart Cath and Coronary Angiography;  Surgeon: Troy Sine, MD;  Location: [facility];  Service: Cardiovascular;  Laterality: N/A;   CARDIAC CATHETERIZATION  ~ 2015   CARDIAC DEFIBRILLATOR PLACEMENT  08/03/2016   CESAREAN SECTION  1999   COLONOSCOPY WITH PROPOFOL N/A 04/21/2016   Procedure: COLONOSCOPY WITH PROPOFOL;  Surgeon: Jerene Bears, MD;  Location: [facility];  Service: Gastroenterology;  Laterality: N/A;   ESOPHAGOGASTRODUODENOSCOPY (EGD) WITH PROPOFOL N/A 04/21/2016    Procedure: ESOPHAGOGASTRODUODENOSCOPY (EGD) WITH PROPOFOL;  Surgeon: Jerene Bears, MD;  Location: [facility];  Service: Gastroenterology;  Laterality: N/A;   FOOT FRACTURE SURGERY Right ~ 2003   FRACTURE SURGERY     ICD IMPLANT N/A 08/03/2016   Procedure: ICD Implant;  Surgeon: Deboraha Sprang, MD;  Location: [facility];  Service: Cardiovascular;  Laterality: N/A;   LAPAROSCOPIC CHOLECYSTECTOMY  12/2006   LAPAROSCOPIC GASTRIC SLEEVE RESECTION     LAPAROSCOPY ABDOMEN DIAGNOSTIC  2008   "cut bile duct w/gallbladder OR; had to go in later & fix leak; hospitalized for 2 months"   LEFT HEART CATHETERIZATION WITH CORONARY ANGIOGRAM N/A 02/26/2014   Procedure: LEFT HEART CATHETERIZATION WITH CORONARY ANGIOGRAM;  Surgeon: Jettie Booze, MD;  [facility];  Service: Cardiovascular;  Laterality: N/A;   OVARIAN CYST REMOVAL Right 1999   PVC ABLATION N/A 12/01/2017   Procedure: PVC ABLATION;  Surgeon: Evans Lance, MD;  Location: [facility];  Service: Cardiovascular;  Laterality: N/A;   RIGHT HEART CATH N/A 08/05/2017   Procedure: RIGHT HEART CATH;  Surgeon: Jolaine Artist, MD;  Location: [facility];  Service: Cardiovascular;  Laterality: N/A;   RIGHT HEART CATH N/A 11/16/2019   Procedure: RIGHT HEART CATH;  Surgeon: Jolaine Artist, MD;  Location: [facility];  Service: Cardiovascular;  Laterality: N/A;   TUBAL LIGATION  1999   Social History   Socioeconomic History   Marital status: Married    Spouse name: Not on file   Number of children: 1   Years of education: Not on file   Highest education level: Not on file  Occupational History   Occupation: stay at home mom  Tobacco Use   Smoking status: Never   Smokeless tobacco: Never  Vaping Use   Vaping Use: Never used  Substance and Sexual Activity   Alcohol use: No   Drug use: No   Sexual activity: Yes  Other Topics Concern   Not on file  Social History Narrative   Not on file    Social Determinants of Health   Financial Resource Strain: Not on file  Food Insecurity: Not on file  Transportation Needs: Not on file  Physical Activity: Not on file  Stress: Not on file  Social Connections: Not on file   Allergies  Allergen Reactions   Vancomycin Other (See Comments)    "did something to my kidneys," PROGRESSED TO KIDNEY FAILURE!!   Aspirin Other (See Comments)    Wheezing, (Pt states that she just wheezes some when she takes aspirin by itself but she can take aspirin in a combination product).   Contrast Media [Iodinated Diagnostic Agents] Other (See Comments)    Multiple CT contrast studies done over 2 weeks caused ARF   Cyclobenzaprine Other (See Comments)    Can not tolerate   Ciprofloxacin Itching and Rash   Farxiga [Dapagliflozin] Rash   Sulfa Antibiotics Itching and Rash  Family History  Problem Relation Age of Onset   Emphysema Maternal Grandmother        smoked   Heart disease Maternal Grandmother 60       MI   Rheum arthritis Mother    Allergies Daughter    Colon cancer Neg Hx     Current Outpatient Medications (Endocrine & Metabolic):    empagliflozin (JARDIANCE) 10 MG TABS tablet, Take 1 tablet (10 mg total) by mouth daily.   Levonorgestrel-Ethinyl Estradiol (AMETHIA) 0.15-0.03 &0.01 MG tablet, Take 1 tablet by mouth daily.  Current Outpatient Medications (Cardiovascular):    amiodarone (PACERONE) 200 MG tablet, Take 1 tablet (200 mg total) by mouth daily.   ivabradine (CORLANOR) 7.5 MG TABS tablet, Take 1 tablet (7.5 mg total) by mouth 2 (two) times daily with a meal.   metoprolol succinate (TOPROL-XL) 50 MG 24 hr tablet, Take 1 tablet (50 mg total) by mouth daily. Take with or immediately following a meal.   nitroGLYCERIN (NITROSTAT) 0.4 MG SL tablet, Place 1 tablet (0.4 mg total) under the tongue every 5 (five) minutes as needed for chest pain.   sacubitril-valsartan (ENTRESTO) 49-51 MG, Take 1 tablet by mouth 2 (two) times daily.    spironolactone (ALDACTONE) 25 MG tablet, Take 1 tablet (25 mg total) by mouth at bedtime.   torsemide (DEMADEX) 20 MG tablet, Take 20 mg by mouth daily.  Current Outpatient Medications (Respiratory):    albuterol (PROAIR HFA) 108 (90 Base) MCG/ACT inhaler, Inhale 1-2 puffs into the lungs every 6 (six) hours as needed for wheezing or shortness of breath. Follow-up appt is due must see provider for future refills   budesonide-formoterol (SYMBICORT) 160-4.5 MCG/ACT inhaler, Inhale 2 puffs into the lungs 2 (two) times daily as needed. Follow-up appt is due must see provider for future refills   fluticasone (FLONASE) 50 MCG/ACT nasal spray, Place 2 sprays into both nostrils daily as needed for allergies. Follow-up appt is due must see provider for future refills   ipratropium-albuterol (DUONEB) 0.5-2.5 (3) MG/3ML SOLN, Take 3 mLs by nebulization every 6 (six) hours as needed.  Current Outpatient Medications (Analgesics):    diclofenac (VOLTAREN) 75 MG EC tablet, Take 1 tablet by mouth twice daily   Current Outpatient Medications (Other):    diclofenac Sodium (VOLTAREN) 1 % GEL, Apply 4 g topically 4 (four) times daily.   dicyclomine (BENTYL) 20 MG tablet, Take 1 tablet (20 mg total) by mouth daily as needed for spasms.   gabapentin (NEURONTIN) 300 MG capsule, Take 1 capsule (300 mg total) by mouth at bedtime as needed (back spasms).   lidocaine (LIDODERM) 5 %, Place 1 patch onto the skin daily. Remove & Discard patch within 12 hours or as directed by MD   Magnesium Oxide 200 MG TABS, Take 1 tablet (200 mg total) by mouth daily.   Multiple Vitamins-Minerals (MULTIVITAMIN GUMMIES WOMENS PO), Take 2 tablets by mouth at bedtime.    omeprazole (PRILOSEC) 20 MG capsule, Take 1 capsule (20 mg total) by mouth daily as needed (for reflux symptoms).   POTASSIUM CHLORIDE PO, Take 20 mEq by mouth daily in the afternoon.   tiZANidine (ZANAFLEX) 2 MG tablet, Take 1 tablet (2 mg total) by mouth at bedtime.    Vitamin D, Ergocalciferol, (DRISDOL) 1.25 MG (50000 UNIT) CAPS capsule, Take 1 capsule (50,000 Units total) by mouth every 7 (seven) days.   Reviewed prior external information including notes and imaging from  primary care provider As well as notes that were available  from care everywhere and other healthcare systems.  Past medical history, social, surgical and family history all reviewed in electronic medical record.  No pertanent information unless stated regarding to the chief complaint.   Review of Systems:  No headache, visual changes, nausea, vomiting, diarrhea, constipation, dizziness, abdominal pain, skin rash, fevers, chills, night sweats, weight loss, swollen lymph nodes, body aches, joint swelling, chest pain, shortness of breath, mood changes. POSITIVE muscle aches  Objective  Blood pressure 122/80, pulse (!) 44, height 5\' 5"  (1.651 m), weight 213 lb (96.6 kg), SpO2 97 %.   General: No apparent distress alert and oriented x3 mood and affect normal, dressed appropriately.  HEENT: Pupils equal, extraocular movements intact  Respiratory: [name] speak in full sentences and does not appear short of breath  Cardiovascular: No lower extremity edema, non tender, no erythema  Gait normal with good balance and coordination.  MSK: Low back exam does have some tenderness to palpation in paraspinal musculature.  Tenderness over the sacroiliac joint on the right side.  Negative straight leg test no noted today.  Mild tightness of the hamstring.   Osteopathic findings C2 flexed rotated and side bent right C4 flexed rotated and side bent left C6 flexed rotated and side bent left T3 extended rotated and side bent right inhaled third rib T9 extended rotated and side bent left L2 flexed rotated and side bent right Sacrum right on right    Impression and Recommendations:     The above documentation has been reviewed and is accurate and complete Lyndal Pulley, DO

## 2021-04-29 ENCOUNTER — Other Ambulatory Visit: Payer: Self-pay

## 2021-04-29 ENCOUNTER — Ambulatory Visit: Payer: HMO | Admitting: Family Medicine

## 2021-04-29 ENCOUNTER — Encounter: Payer: Self-pay | Admitting: Family Medicine

## 2021-04-29 VITALS — BP 122/80 | HR 44 | Ht 65.0 in | Wt 213.0 lb

## 2021-04-29 DIAGNOSIS — G8929 Other chronic pain: Secondary | ICD-10-CM | POA: Diagnosis not present

## 2021-04-29 DIAGNOSIS — M9901 Segmental and somatic dysfunction of cervical region: Secondary | ICD-10-CM | POA: Diagnosis not present

## 2021-04-29 DIAGNOSIS — M9904 Segmental and somatic dysfunction of sacral region: Secondary | ICD-10-CM

## 2021-04-29 DIAGNOSIS — G5701 Lesion of sciatic nerve, right lower limb: Secondary | ICD-10-CM

## 2021-04-29 DIAGNOSIS — M9902 Segmental and somatic dysfunction of thoracic region: Secondary | ICD-10-CM

## 2021-04-29 DIAGNOSIS — M9903 Segmental and somatic dysfunction of lumbar region: Secondary | ICD-10-CM | POA: Diagnosis not present

## 2021-04-29 DIAGNOSIS — M9908 Segmental and somatic dysfunction of rib cage: Secondary | ICD-10-CM

## 2021-04-29 DIAGNOSIS — M533 Sacrococcygeal disorders, not elsewhere classified: Secondary | ICD-10-CM

## 2021-04-29 DIAGNOSIS — M545 Low back pain, unspecified: Secondary | ICD-10-CM

## 2021-04-29 MED ORDER — TIZANIDINE HCL 2 MG PO TABS: 2.0000 mg | ORAL_TABLET | Freq: Every day | ORAL | 0 refills | Status: DC

## 2021-04-29 NOTE — Assessment & Plan Note (Signed)
Chronic, with mild exacerbation.  Attempted osteopathic manipulation with some improvement.  Mainly think it is more the sacroiliac joint and does have the underlying greater trochanteric bursitis we will continue to monitor.  Discussed the topical anti-inflammatories.  Discussed Tylenol.  Patient is avoiding oral anti-inflammatories were possible.  Discussed icing regimen.  Follow-up with me again in 6 to 8 weeks

## 2021-04-29 NOTE — Patient Instructions (Signed)
PT [facility] Did get it in place See me again in 5 weeks

## 2021-04-30 ENCOUNTER — Ambulatory Visit: Payer: HMO

## 2021-04-30 DIAGNOSIS — Z95810 Presence of automatic (implantable) cardiac defibrillator: Secondary | ICD-10-CM

## 2021-04-30 DIAGNOSIS — I5042 Chronic combined systolic (congestive) and diastolic (congestive) heart failure: Secondary | ICD-10-CM

## 2021-05-01 ENCOUNTER — Telehealth: Payer: Self-pay

## 2021-05-01 NOTE — Telephone Encounter (Signed)
LMOVM for patient to send missed ICM transmission. 

## 2021-05-02 ENCOUNTER — Telehealth: Payer: Self-pay

## 2021-05-02 NOTE — Telephone Encounter (Signed)
Remote ICM transmission received.  Attempted call to patient regarding ICM remote transmission and left detailed message per DPR.  Advised to return call for any fluid symptoms or questions. Next ICM remote transmission scheduled 05/12/2021.

## 2021-05-02 NOTE — Progress Notes (Signed)
EPIC Encounter for ICM Monitoring  Patient Name: [name] is a 45 y.o. female Date: 05/02/2021 Primary Care Physican: Hoyt Koch, MD Primary Cardiologist: Bensimhon Electrophysiologist: Caryl Comes 04/03/2021 Office Weight: 215 lbs     04/14/2021 Weight: 216 lbs   04/22/2021 Weight: 206-208 lbs                                                       Attempted call to patient and unable to reach.  Left detailed message per DPR regarding transmission. Transmission reviewed.    [facility] HF Index decreased to 28 suggesting possible fluid accumulation improvement after taking 2 days of Lasix 40 mg w/extra K+. Thoracic impedance trending down.   Barostim implant 6/27.   Prescribed: Torsemide 20 mg Take 1 tablet  (20 mg total) by mouth daily.  Potassium 20 mEq take 1 tablet daily Spironolactone 25 mg take 1 tablet (25 mg total) at bedtime.    Labs: 02/25/2021 Creatinine 1.03, BUN 11, Potassium 4.0, Sodium 138 02/04/2021 Creatinine 0.94, BUN 7,   Potassium 3.4, Sodium 137 12/24/2020 Creatinine 0.80, BUN 7,   Potassium 3.6, Sodium 136, GFR >60 11/29/2020 Creatinine 0.77, BUN 5,   Potassium 3.1, Sodium 138, GFR >60 11/06/2020 Creatinine 0.78, BUN <5, Potassium 3.3, Sodium 137, GFR >60 10/16/2020 Creatinine 0.86, BUN 10, Potassium 3.7, Sodium 142, GFR >60  A complete set of results can be found in Results Review.   Recommendations: Left voice mail with ICM number and encouraged to call if experiencing any fluid symptoms.   Follow-up plan: ICM clinic phone appointment on 05/12/2021 31 day and to recheck fluid levels.   91 day device clinic remote transmission 06/18/2021.              EP/Cardiology next office visit:   06/24/2021 with Dr Haroldine Laws.  06/09/2021 with Oda Kilts, PA.         Copy of ICM check sent to Dr. Caryl Comes.  Copy to Dr Haroldine Laws for review.  ICM trend: 05/01/2021.      [name], RN 05/02/2021 4:10 PM

## 2021-05-12 ENCOUNTER — Ambulatory Visit (INDEPENDENT_AMBULATORY_CARE_PROVIDER_SITE_OTHER): Payer: HMO

## 2021-05-12 DIAGNOSIS — I5042 Chronic combined systolic (congestive) and diastolic (congestive) heart failure: Secondary | ICD-10-CM

## 2021-05-12 DIAGNOSIS — Z95810 Presence of automatic (implantable) cardiac defibrillator: Secondary | ICD-10-CM | POA: Diagnosis not present

## 2021-05-14 NOTE — Progress Notes (Signed)
Electrophysiology Office Note Date: 05/14/2021  ID:  Desarea, Ohagan 25-Jun-1975, MRN [id number]  PCP: Hoyt Koch, MD Primary Cardiologist: Sinclair Grooms, MD Electrophysiologist: Cristopher Peru, MD   CC: Pacemaker follow-up  [name] is a 45 y.o. female seen today for Cristopher Peru, MD for routine electrophysiology followup.   At last visit device downtitrated from 5.0 ma to 4.0 ma with intermittent difficulty swallowing / sticking sensation in her neck.  She reports she still has some SOB with moderate exertion. Still has some "rough days" but good days are more often and better.   She had reported Prior to barostim she would be exhausted after 1 department store. She recently had a shopping trip with friends and went to 4 separate stores without issue. She is still not full where she would like to be, but is pleased with the progress.    Device History: Barostim (standard) implanted 11/08/20 for Chronic systolic CHF Medtronic Single Chamber ICD implanted 2018 for NICM  Past Medical History:  Diagnosis Date   Acute on chronic systolic (congestive) heart failure ([place]) 04/29/2017   Acute pain of right shoulder 06/16/2017   AICD (automatic cardioverter/defibrillator) present    Anginal pain (HCC)    Anxiety    Arthritis    right shoulder    Asthma    CHF (congestive heart failure) ([place])    Chronic combined systolic and diastolic heart failure ([place]) 03/05/2014   Closed low lateral malleolus fracture 10/23/2013   Cystitis 10/21/2017   Depression    Depression with anxiety 01/20/2013   Diabetes mellitus without complication (HCC)    Diverticulosis    Dyspnea    comes and goes intermittently mostly with exertion    Dysrhythmia    Essential hypertension    Prev followed by [name]    Fibroid    age 20   Gallstones    Generalized abdominal cramping    History of cardiomyopathy    Hypertension    IBS (irritable bowel syndrome)    ICD  (implantable cardioverter-defibrillator), single, 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   Insomnia 05/07/2017   Labral tear of shoulder 04/04/2015    Injected 04/04/2015 Injected 12/03/2015    Migraine    "monthly" (08/03/2016)   Myofascial pain 06/16/2017   NICM (nonischemic cardiomyopathy) ([place]) 08/03/2016   Nonallopathic lesion of lumbosacral region 11/16/2016   Nonallopathic lesion of sacral region 11/16/2016   Nonallopathic lesion of thoracic region 08/20/2014   Nonspecific chest pain 04/28/2017   OSA (obstructive sleep apnea) 01/02/2013   NPSG 2009:  AHI 9/hr. CPAP intolerance >> "smothering" Good tolerance of auto device (optimal pressure 12-13 on download).  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   PVC (premature ventricular contraction) 06/23/2016   Seizures ([place])    "as a child" (08/03/2016)   Termination of pregnancy    due to cardiac risk   Past Surgical History:  Procedure Laterality Date   CARDIAC CATHETERIZATION N/A 11/11/2015   Procedure: Right/Left Heart Cath and Coronary Angiography;  Surgeon: Troy Sine, MD;  Location: [facility];  Service: Cardiovascular;  Laterality: N/A;   CARDIAC CATHETERIZATION  ~ 2015   CARDIAC DEFIBRILLATOR PLACEMENT  08/03/2016   CESAREAN SECTION  1999   COLONOSCOPY WITH PROPOFOL N/A 04/21/2016  Procedure: COLONOSCOPY WITH PROPOFOL;  Surgeon: Jerene Bears, MD;  Location: [facility];  Service: Gastroenterology;  Laterality: N/A;   ESOPHAGOGASTRODUODENOSCOPY (EGD) WITH PROPOFOL N/A 04/21/2016   Procedure: ESOPHAGOGASTRODUODENOSCOPY (EGD) WITH PROPOFOL;  Surgeon: Jerene Bears, MD;  Location: [facility];  Service: Gastroenterology;  Laterality: N/A;   FOOT FRACTURE SURGERY Right ~ 2003   FRACTURE SURGERY     ICD IMPLANT N/A 08/03/2016   Procedure: ICD Implant;  Surgeon: Deboraha Sprang, MD;   Location: [facility];  Service: Cardiovascular;  Laterality: N/A;   LAPAROSCOPIC CHOLECYSTECTOMY  12/2006   LAPAROSCOPIC GASTRIC SLEEVE RESECTION     LAPAROSCOPY ABDOMEN DIAGNOSTIC  2008   "cut bile duct w/gallbladder OR; had to go in later & fix leak; hospitalized for 2 months"   LEFT HEART CATHETERIZATION WITH CORONARY ANGIOGRAM N/A 02/26/2014   Procedure: LEFT HEART CATHETERIZATION WITH CORONARY ANGIOGRAM;  Surgeon: Jettie Booze, MD;  Location: [facility];  Service: Cardiovascular;  Laterality: N/A;   OVARIAN CYST REMOVAL Right 1999   PVC ABLATION N/A 12/01/2017   Procedure: PVC ABLATION;  Surgeon: Evans Lance, MD;  Location: [facility];  Service: Cardiovascular;  Laterality: N/A;   RIGHT HEART CATH N/A 08/05/2017   Procedure: RIGHT HEART CATH;  Surgeon: Jolaine Artist, MD;  Location: [facility];  Service: Cardiovascular;  Laterality: N/A;   RIGHT HEART CATH N/A 11/16/2019   Procedure: RIGHT HEART CATH;  Surgeon: Jolaine Artist, MD;  Location: [facility];  Service: Cardiovascular;  Laterality: N/A;   TUBAL LIGATION  1999    Current Outpatient Medications  Medication Sig Dispense Refill   albuterol (PROAIR HFA) 108 (90 Base) MCG/ACT inhaler Inhale 1-2 puffs into the lungs every 6 (six) hours as needed for wheezing or shortness of breath. Follow-up appt is due must see provider for future refills 18 g 0   amiodarone (PACERONE) 200 MG tablet Take 1 tablet (200 mg total) by mouth daily. 90 tablet 3   budesonide-formoterol (SYMBICORT) 160-4.5 MCG/ACT inhaler Inhale 2 puffs into the lungs 2 (two) times daily as needed. Follow-up appt is due must see provider for future refills 10 g 0   diclofenac (VOLTAREN) 75 MG EC tablet Take 1 tablet by mouth twice daily 30 tablet 0   diclofenac Sodium (VOLTAREN) 1 % GEL Apply 4 g topically 4 (four) times daily. 300 g 6   dicyclomine (BENTYL) 20 MG tablet Take 1 tablet (20 mg total) by mouth daily as needed  for spasms. 90 tablet 3   empagliflozin (JARDIANCE) 10 MG TABS tablet Take 1 tablet (10 mg total) by mouth daily. 30 tablet 3   fluticasone (FLONASE) 50 MCG/ACT nasal spray Place 2 sprays into both nostrils daily as needed for allergies. Follow-up appt is due must see provider for future refills 16 g 0   gabapentin (NEURONTIN) 300 MG capsule Take 1 capsule (300 mg total) by mouth at bedtime as needed (back spasms). 90 capsule 3   ipratropium-albuterol (DUONEB) 0.5-2.5 (3) MG/3ML SOLN Take 3 mLs by nebulization every 6 (six) hours as needed. 360 mL 0   ivabradine (CORLANOR) 7.5 MG TABS tablet Take 1 tablet (7.5 mg total) by mouth 2 (two) times daily with a meal. 60 tablet 2   Levonorgestrel-Ethinyl Estradiol (AMETHIA) 0.15-0.03 &0.01 MG tablet Take 1 tablet by mouth daily. 91 tablet 4   lidocaine (LIDODERM) 5 % Place 1 patch onto the skin daily. Remove & Discard patch within 12  hours or as directed by MD 90 patch 3   Magnesium Oxide 200 MG TABS Take 1 tablet (200 mg total) by mouth daily. 90 tablet 3   metoprolol succinate (TOPROL-XL) 50 MG 24 hr tablet Take 1 tablet (50 mg total) by mouth daily. Take with or immediately following a meal. 30 tablet 3   Multiple Vitamins-Minerals (MULTIVITAMIN GUMMIES WOMENS PO) Take 2 tablets by mouth at bedtime.      nitroGLYCERIN (NITROSTAT) 0.4 MG SL tablet Place 1 tablet (0.4 mg total) under the tongue every 5 (five) minutes as needed for chest pain. 75 tablet 0   omeprazole (PRILOSEC) 20 MG capsule Take 1 capsule (20 mg total) by mouth daily as needed (for reflux symptoms). 30 capsule 0   POTASSIUM CHLORIDE PO Take 20 mEq by mouth daily in the afternoon.     sacubitril-valsartan (ENTRESTO) 49-51 MG Take 1 tablet by mouth 2 (two) times daily. 60 tablet 6   spironolactone (ALDACTONE) 25 MG tablet Take 1 tablet (25 mg total) by mouth at bedtime. 90 tablet 3   tiZANidine (ZANAFLEX) 2 MG tablet Take 1 tablet (2 mg total) by mouth at bedtime. 30 tablet 0   torsemide  (DEMADEX) 20 MG tablet Take 20 mg by mouth daily.     Vitamin D, Ergocalciferol, (DRISDOL) 1.25 MG (50000 UNIT) CAPS capsule Take 1 capsule (50,000 Units total) by mouth every 7 (seven) days. 12 capsule 0   No current facility-administered medications for this visit.    Allergies:   Vancomycin, Aspirin, Contrast media [iodinated diagnostic agents], Cyclobenzaprine, Ciprofloxacin, Farxiga [dapagliflozin], and Sulfa antibiotics   Social History: Social History   Socioeconomic History   Marital status: Married    Spouse name: Not on file   Number of children: 1   Years of education: Not on file   Highest education level: Not on file  Occupational History   Occupation: stay at home mom  Tobacco Use   Smoking status: Never   Smokeless tobacco: Never  Vaping Use   Vaping Use: Never used  Substance and Sexual Activity   Alcohol use: No   Drug use: No   Sexual activity: Yes  Other Topics Concern   Not on file  Social History Narrative   Not on file   Social Determinants of Health   Financial Resource Strain: Not on file  Food Insecurity: Not on file  Transportation Needs: Not on file  Physical Activity: Not on file  Stress: Not on file  Social Connections: Not on file  Intimate Partner Violence: Not on file    Family History: Family History  Problem Relation Age of Onset   Emphysema Maternal Grandmother        smoked   Heart disease Maternal Grandmother 67       MI   Rheum arthritis Mother    Allergies Daughter    Colon cancer Neg Hx      Review of Systems: All other systems reviewed and are otherwise negative except as noted above.  Physical Exam: Vitals:   05/15/21 0944  Weight: 216 lb 3.2 oz (98.1 kg)  BP 104/60 HR 51 with ectopy    GEN- The patient is well appearing, alert and oriented x 3 today.   HEENT: normocephalic, atraumatic; sclera clear, conjunctiva pink; hearing intact; oropharynx clear; neck supple  Lungs- Clear to ausculation bilaterally,  normal work of breathing.  No wheezes, rales, rhonchi Heart- Regular rate and rhythm, no murmurs, rubs or gallops  GI- soft, non-tender, non-distended,  bowel sounds present  Extremities- no clubbing or cyanosis. No edema MS- no significant deformity or atrophy Skin- warm and dry, no rash or lesion; PPM pocket well healed Psych- euthymic mood, full affect Neuro- strength and sensation are intact  PPM Interrogation- reviewed in detail today,  See PACEART report  EKG:  EKG is not ordered today.  Recent Labs: 08/25/2020: B Natriuretic Peptide 301.6 08/26/2020: Magnesium 2.1 02/04/2021: NT-Pro BNP 857; TSH 2.850 02/25/2021: ALT 10; BUN 11; Creatinine, Ser 1.03; Hemoglobin 14.3; Platelets 182; Potassium 4.0; Sodium 138   Wt Readings from Last 3 Encounters:  04/29/21 213 lb (96.6 kg)  04/11/21 214 lb (97.1 kg)  04/03/21 215 lb (97.5 kg)     Other studies Reviewed: Additional studies/ records that were reviewed today include: Previous EP office notes, Previous remote checks, Most recent labwork.   Assessment and Plan:  1. Chronic systolic CHF s/p [employer] and Barostim implantation Echo 10/03/2020 LVEF 30-35% NYHA II-III symptoms Device titrated from 4.0 ma @ 125 ms Started at 5.4 ma @ 65 ms and titrated up to 8.2 ma @ 65 ms without stim.  Device impedence stable. Pt goals are to improve fatigue, take tap lessons/dance class 2x a week. She is progressing toward this Normal device function See scanned report. F/u in 2 months to consider up-titration, but for now will not increase.    2. Frequent PVCs Not candidate for re-do ablation due to location of her PVCs Recent Zio patch with PVC burden ~13%  Optimize GDMT and BB as tolerated. Recently, BP has been low.  Barostim titration has not been associated with statistically significant hypotension, but if needed, we can turn her back down.  Currently on amiodarone 200 mg daily. She has no other good AAD options.  Could consider  referral to tertiary center for second opinion of effectiveness ablation; she declines.   3. Intermittent dysphagia Suspect this may have been a product of stim from her device. Has not recurred  Current medicines are reviewed at length with the patient today.    Labs/ tests ordered today include:   Disposition:   Follow up with EP APP in 3 months.  Jacalyn Lefevre, PA-C  05/14/2021 11:05 AM  [facility] [address] [place] [address] [phone] (office) [phone] (fax)

## 2021-05-15 ENCOUNTER — Other Ambulatory Visit: Payer: Self-pay

## 2021-05-15 ENCOUNTER — Ambulatory Visit (INDEPENDENT_AMBULATORY_CARE_PROVIDER_SITE_OTHER): Payer: HMO | Admitting: Student

## 2021-05-15 VITALS — Wt 216.2 lb

## 2021-05-15 DIAGNOSIS — I493 Ventricular premature depolarization: Secondary | ICD-10-CM

## 2021-05-15 DIAGNOSIS — Z5181 Encounter for therapeutic drug level monitoring: Secondary | ICD-10-CM | POA: Diagnosis not present

## 2021-05-15 DIAGNOSIS — R5383 Other fatigue: Secondary | ICD-10-CM

## 2021-05-15 DIAGNOSIS — I5042 Chronic combined systolic (congestive) and diastolic (congestive) heart failure: Secondary | ICD-10-CM

## 2021-05-15 NOTE — Patient Instructions (Signed)
Medication Instructions:  Your physician recommends that you continue on your current medications as directed. Please refer to the Current Medication list given to you today.  *If you need a refill on your cardiac medications before your next appointment, please call your pharmacy*   Lab Work: TODAY: CMET, BNP, TSH  If you have labs (blood work) drawn today and your tests are completely normal, you will receive your results only by: [place] (if you have MyChart) OR A paper copy in the mail If you have any lab test that is abnormal or we need to change your treatment, we will call you to review the results.   Follow-Up: At [facility], you and your health needs are our priority.  As part of our continuing mission to provide you with exceptional heart care, we have created designated Provider Care Teams.  These Care Teams include your primary Cardiologist (physician) and Advanced Practice Providers (APPs -  Physician Assistants and Nurse Practitioners) who all work together to provide you with the care you need, when you need it.   Your next appointment:   3 month(s)  The format for your next appointment:   In Person  Provider:   Legrand Como "Oda Kilts, PA-C

## 2021-05-15 NOTE — Progress Notes (Signed)
EPIC Encounter for ICM Monitoring  Patient Name: [name] is a 45 y.o. female Date: 05/15/2021 Primary Care Physican: Hoyt Koch, MD Primary Cardiologist: Bensimhon Electrophysiologist: Caryl Comes 04/03/2021 Office Weight: 215 lbs     04/14/2021 Weight: 216 lbs   04/22/2021 Weight: 206-208 lbs                                                       Attempted call to patient and unable to reach.  Left detailed message per DPR regarding transmission. Transmission reviewed.    Heartlogic HF Index decreased to 13 suggesting possible fluid levels returned to normal.   Barostim implant 6/27.   Prescribed: Torsemide 20 mg Take 1 tablet  (20 mg total) by mouth daily.  Potassium 20 mEq take 1 tablet daily Spironolactone 25 mg take 1 tablet (25 mg total) at bedtime.    Labs: 02/25/2021 Creatinine 1.03, BUN 11, Potassium 4.0, Sodium 138 02/04/2021 Creatinine 0.94, BUN 7,   Potassium 3.4, Sodium 137 12/24/2020 Creatinine 0.80, BUN 7,   Potassium 3.6, Sodium 136, GFR >60 11/29/2020 Creatinine 0.77, BUN 5,   Potassium 3.1, Sodium 138, GFR >60 11/06/2020 Creatinine 0.78, BUN <5, Potassium 3.3, Sodium 137, GFR >60 10/16/2020 Creatinine 0.86, BUN 10, Potassium 3.7, Sodium 142, GFR >60  A complete set of results can be found in Results Review.   Recommendations: Left voice mail with ICM number and encouraged to call if experiencing any fluid symptoms.   Follow-up plan: ICM clinic phone appointment on 06/16/2021.   91 day device clinic remote transmission 06/18/2021.              EP/Cardiology next office visit:   06/24/2021 with Dr Haroldine Laws.           Copy of ICM check sent to Dr. Caryl Comes.   3 Month Trend    8 Day Data Trend          [name], RN 05/15/2021 12:17 PM

## 2021-05-16 LAB — COMPREHENSIVE METABOLIC PANEL
ALT: 9 IU/L (ref 0–32)
AST: 15 IU/L (ref 0–40)
Albumin/Globulin Ratio: 1.6 (ref 1.2–2.2)
Albumin: 4.5 g/dL (ref 3.8–4.8)
Alkaline Phosphatase: 92 IU/L (ref 44–121)
BUN/Creatinine Ratio: 13 (ref 9–23)
BUN: 11 mg/dL (ref 6–24)
Bilirubin Total: 0.3 mg/dL (ref 0.0–1.2)
CO2: 26 mmol/L (ref 20–29)
Calcium: 9.3 mg/dL (ref 8.7–10.2)
Chloride: 102 mmol/L (ref 96–106)
Creatinine, Ser: 0.82 mg/dL (ref 0.57–1.00)
Globulin, Total: 2.8 g/dL (ref 1.5–4.5)
Glucose: 91 mg/dL (ref 70–99)
Potassium: 3.8 mmol/L (ref 3.5–5.2)
Sodium: 140 mmol/L (ref 134–144)
Total Protein: 7.3 g/dL (ref 6.0–8.5)
eGFR: 89 mL/min/{1.73_m2} (ref >59)

## 2021-05-16 LAB — PRO B NATRIURETIC PEPTIDE: NT-Pro BNP: 576 pg/mL — ABNORMAL HIGH (ref 0–249)

## 2021-05-16 LAB — TSH: TSH: 2.46 u[IU]/mL (ref 0.450–4.500)

## 2021-05-20 ENCOUNTER — Ambulatory Visit: Payer: HMO | Admitting: Family Medicine

## 2021-05-27 ENCOUNTER — Ambulatory Visit (INDEPENDENT_AMBULATORY_CARE_PROVIDER_SITE_OTHER): Payer: HMO

## 2021-05-27 ENCOUNTER — Telehealth: Payer: Self-pay

## 2021-05-27 DIAGNOSIS — Z95810 Presence of automatic (implantable) cardiac defibrillator: Secondary | ICD-10-CM

## 2021-05-27 DIAGNOSIS — I5042 Chronic combined systolic (congestive) and diastolic (congestive) heart failure: Secondary | ICD-10-CM

## 2021-05-27 NOTE — Telephone Encounter (Signed)
Remote ICM transmission received.  Attempted call to patient regarding ICM remote transmission and left detailed message per DPR to return call.   

## 2021-05-27 NOTE — Progress Notes (Signed)
EPIC Encounter for ICM Monitoring  Patient Name: [name] is a 46 y.o. female Date: 05/27/2021 Primary Care Physican: Hoyt Koch, MD Primary Cardiologist: Bensimhon Electrophysiologist: Caryl Comes 04/03/2021 Office Weight: 215 lbs     04/14/2021 Weight: 216 lbs   04/22/2021 Weight: 206-208 lbs                                                       Attempted call to patient and unable to reach.  Left detailed message per DPR regarding transmission. Transmission reviewed.    Heartlogic HF Index increased to 34 suggesting possible fluid accumulation.   Thoracic impedance trending down.  Barostim implant 6/27.   Prescribed: Torsemide 20 mg Take 1 tablet  (20 mg total) by mouth daily.  Pt self adjusts Torsemide as needed. Potassium 20 mEq take 1 tablet daily Spironolactone 25 mg take 1 tablet (25 mg total) at bedtime.    Labs: 02/25/2021 Creatinine 1.03, BUN 11, Potassium 4.0, Sodium 138 02/04/2021 Creatinine 0.94, BUN 7,   Potassium 3.4, Sodium 137 12/24/2020 Creatinine 0.80, BUN 7,   Potassium 3.6, Sodium 136, GFR >60 11/29/2020 Creatinine 0.77, BUN 5,   Potassium 3.1, Sodium 138, GFR >60 11/06/2020 Creatinine 0.78, BUN <5, Potassium 3.3, Sodium 137, GFR >60 10/16/2020 Creatinine 0.86, BUN 10, Potassium 3.7, Sodium 142, GFR >60  A complete set of results can be found in Results Review.   Recommendations: Left voice mail with ICM number and encouraged to call if experiencing any fluid symptoms.     Follow-up plan: ICM clinic phone appointment on 06/03/2021 to recheck fluid levels.   91 day device clinic remote transmission 06/18/2021.              EP/Cardiology next office visit:   06/24/2021 with Dr Haroldine Laws.           Copy of ICM check sent to Dr. Caryl Comes.  Will send to Dr Haroldine Laws for review if patient is reached.   3 Month Trend    8 Day Data Trend          [name], RN 05/27/2021 9:47 AM

## 2021-05-29 IMAGING — DX DG HIP (WITH OR WITHOUT PELVIS) 2-3V*L*
2 series · 2 of 2 positions shown · non-contrast
Comparison: None

CLINICAL DATA: Left hip pain

EXAM:
DG HIP (WITH OR WITHOUT PELVIS) 2-3V LEFT

[hip ap]
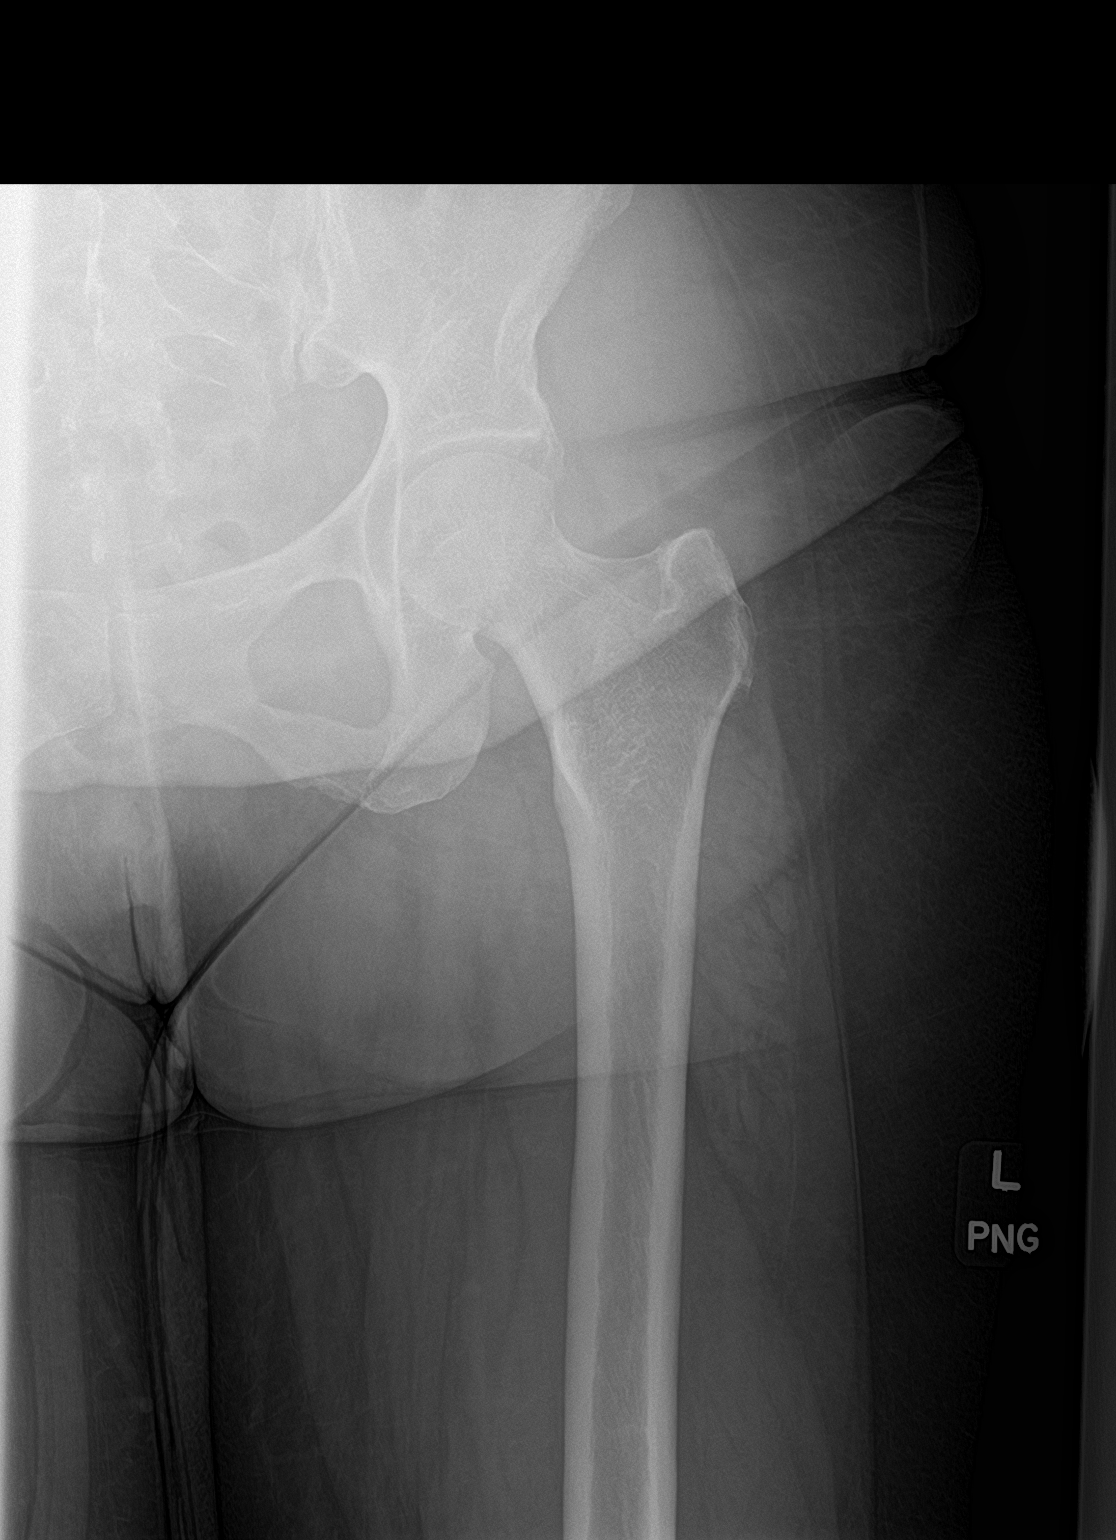

[hip lat]
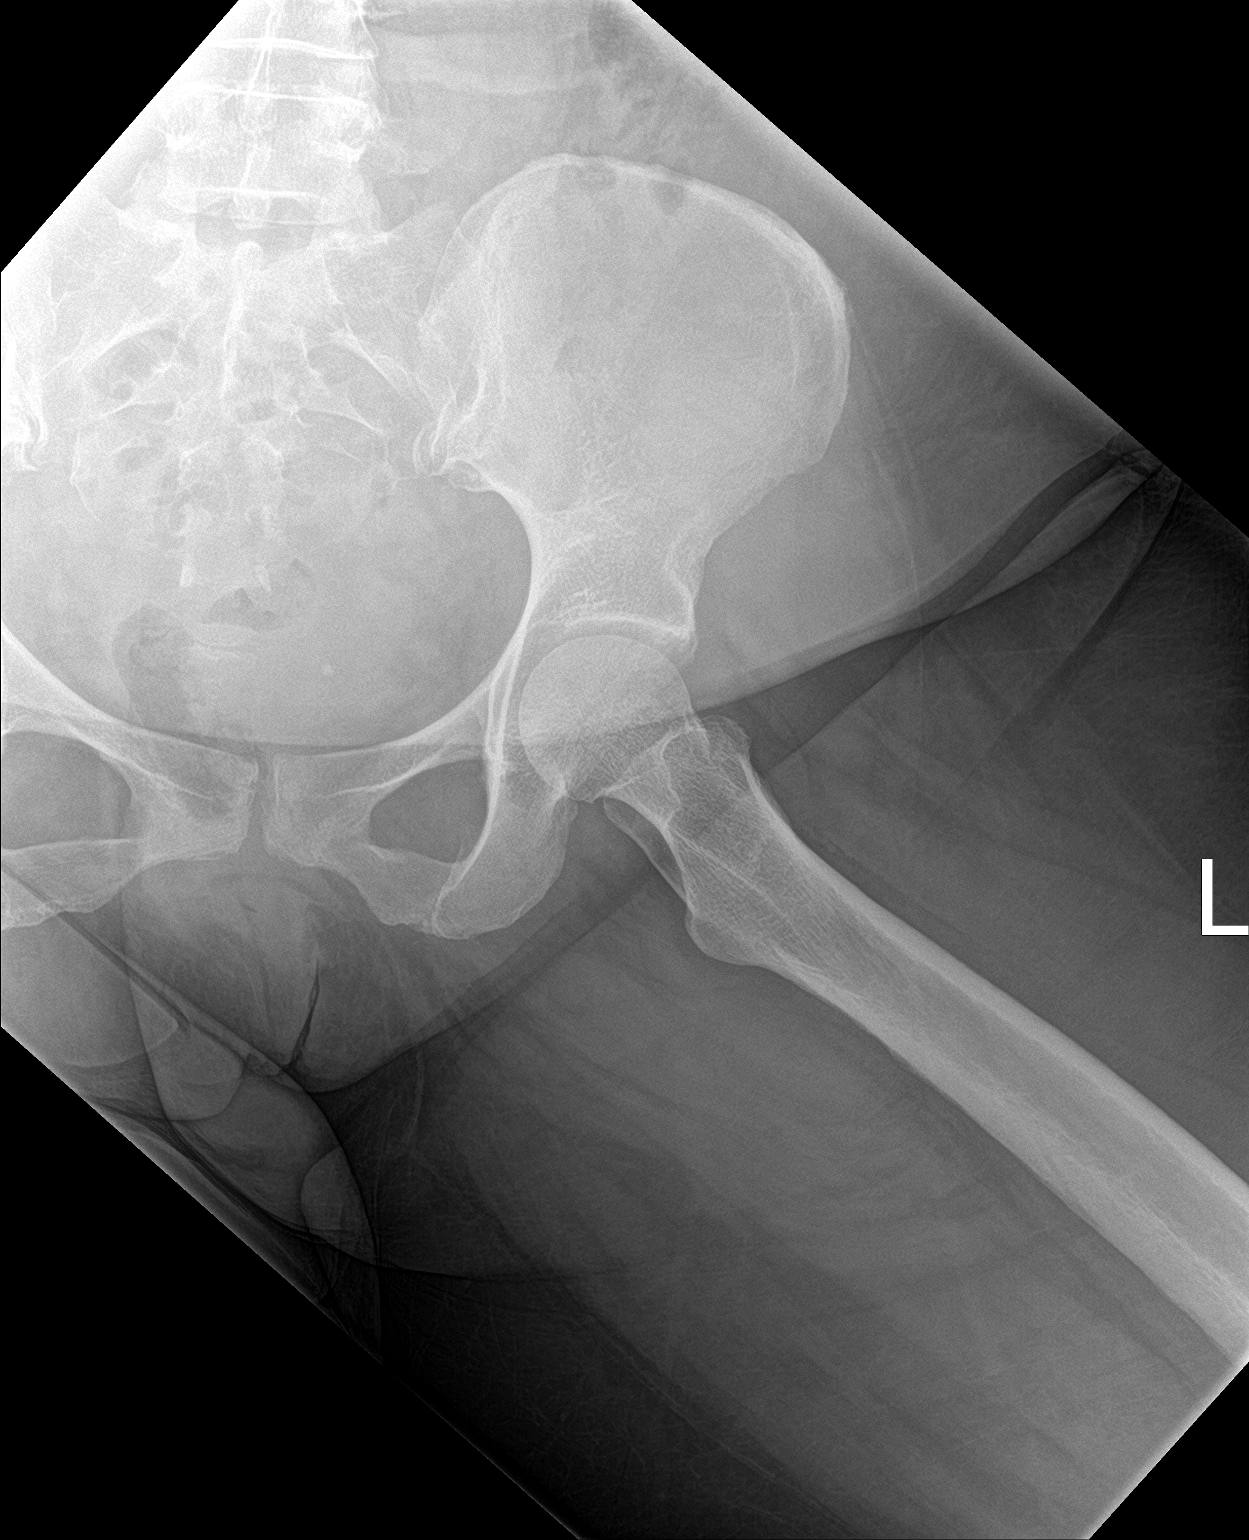

[2 of 2 positions shown; findings below may reference images not displayed]

FINDINGS: There is no evidence of hip fracture or dislocation. There is no
evidence of arthropathy or other focal bone abnormality.
IMPRESSION: Negative.

## 2021-05-31 ENCOUNTER — Emergency Department (HOSPITAL_COMMUNITY): Payer: HMO

## 2021-05-31 ENCOUNTER — Emergency Department (HOSPITAL_COMMUNITY)
Admission: EM | Admit: 2021-05-31 | Discharge: 2021-05-31 | Disposition: A | Discharge status: Home / Self Care | Payer: HMO | Attending: Emergency Medicine | Admitting: Emergency Medicine

## 2021-05-31 ENCOUNTER — Other Ambulatory Visit: Payer: Self-pay

## 2021-05-31 ENCOUNTER — Encounter (HOSPITAL_COMMUNITY): Payer: Self-pay | Admitting: Emergency Medicine

## 2021-05-31 DIAGNOSIS — Z20822 Contact with and (suspected) exposure to covid-19: Secondary | ICD-10-CM | POA: Diagnosis not present

## 2021-05-31 DIAGNOSIS — I110 Hypertensive heart disease with heart failure: Secondary | ICD-10-CM | POA: Diagnosis not present

## 2021-05-31 DIAGNOSIS — Z79899 Other long term (current) drug therapy: Secondary | ICD-10-CM | POA: Diagnosis not present

## 2021-05-31 DIAGNOSIS — I5040 Unspecified combined systolic (congestive) and diastolic (congestive) heart failure: Secondary | ICD-10-CM | POA: Insufficient documentation

## 2021-05-31 DIAGNOSIS — E119 Type 2 diabetes mellitus without complications: Secondary | ICD-10-CM | POA: Diagnosis not present

## 2021-05-31 DIAGNOSIS — R079 Chest pain, unspecified: Secondary | ICD-10-CM | POA: Diagnosis not present

## 2021-05-31 DIAGNOSIS — R0789 Other chest pain: Secondary | ICD-10-CM | POA: Insufficient documentation

## 2021-05-31 LAB — I-STAT BETA HCG BLOOD, ED (MC, WL, AP ONLY): I-stat hCG, quantitative: <5 m[IU]/mL (ref <5)

## 2021-05-31 LAB — BASIC METABOLIC PANEL
Anion gap: 9 (ref 5–15)
BUN: 7 mg/dL (ref 6–20)
CO2: 27 mmol/L (ref 22–32)
Calcium: 9.1 mg/dL (ref 8.9–10.3)
Chloride: 101 mmol/L (ref 98–111)
Creatinine, Ser: 1.02 mg/dL — ABNORMAL HIGH (ref 0.44–1.00)
GFR, Estimated: >60 mL/min (ref >60)
Glucose, Bld: 90 mg/dL (ref 70–99)
Potassium: 3.5 mmol/L (ref 3.5–5.1)
Sodium: 137 mmol/L (ref 135–145)

## 2021-05-31 LAB — CBC
HCT: 42.6 % (ref 36.0–46.0)
Hemoglobin: 13.6 g/dL (ref 12.0–15.0)
MCH: 29.8 pg (ref 26.0–34.0)
MCHC: 31.9 g/dL (ref 30.0–36.0)
MCV: 93.2 fL (ref 80.0–100.0)
Platelets: 217 10*3/uL (ref 150–400)
RBC: 4.57 MIL/uL (ref 3.87–5.11)
RDW: 13.6 % (ref 11.5–15.5)
WBC: 6.9 10*3/uL (ref 4.0–10.5)
nRBC: 0 % (ref 0.0–0.2)

## 2021-05-31 LAB — TROPONIN I (HIGH SENSITIVITY)
Troponin I (High Sensitivity): 3 ng/L (ref <18)
Troponin I (High Sensitivity): 4 ng/L (ref <18)

## 2021-05-31 LAB — RESP PANEL BY RT-PCR (FLU A&B, COVID) ARPGX2
Influenza A by PCR: NEGATIVE
Influenza B by PCR: NEGATIVE
SARS Coronavirus 2 by RT PCR: NEGATIVE

## 2021-05-31 LAB — BRAIN NATRIURETIC PEPTIDE: B Natriuretic Peptide: 332.6 pg/mL — ABNORMAL HIGH (ref 0.0–100.0)

## 2021-05-31 MED ORDER — IBUPROFEN 400 MG PO TABS
600.0000 mg | ORAL_TABLET | Freq: Once | ORAL | Status: AC
  Administered 2021-05-31: 600 mg via ORAL
  Filled 2021-05-31: qty 1

## 2021-05-31 MED ORDER — OXYCODONE-ACETAMINOPHEN 5-325 MG PO TABS
1.0000 | ORAL_TABLET | Freq: Once | ORAL | Status: AC
  Administered 2021-05-31: 1 via ORAL
  Filled 2021-05-31: qty 1

## 2021-05-31 MED ORDER — METOCLOPRAMIDE HCL 5 MG/ML IJ SOLN: 10.0000 mg | Freq: Once | INTRAMUSCULAR | Status: DC

## 2021-05-31 NOTE — ED Notes (Signed)
Patient verbalizes understanding of d/c instructions. Opportunities for questions and answers were provided. Pt d/c from ED and wheeled to lobby.  

## 2021-05-31 NOTE — ED Provider Notes (Signed)
[facility] EMERGENCY DEPARTMENT Provider Note   CSN: [id number] Arrival date & time: 05/31/21  7829     History  Chief Complaint  Patient presents with   Chest Pain    [name] is a 46 y.o. female.  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  Patient states it start yesterday, came on suddenly, states she woke up and started have pain, states pain remains in her left side, mainly underneath her left breast, does not radiate, states its a dull pain will last up to 7 minutes then resolve on its own, there is no alleviating or aggravating factors, denies change in position or p.o. intake making the pain better or worse.  She denies any pleuritic chest pain, dyspnea, orthopnea or peripheral edema.  She states that she has had this pain in the past and will go away on its own she states she is here today because it feels slightly worse than usual.  Denies any recent trauma to the area, no fevers, chills, stomach pains, nausea, vomiting, diarrhea, general body aches.    Home Medications Prior to Admission medications   Medication Sig Start Date End Date Taking? Authorizing Provider  acetaminophen (TYLENOL) 650 MG CR tablet Take 650 mg by mouth every 8 (eight) hours as needed for pain (headache).   Yes [provider]  albuterol (PROAIR HFA) 108 (90 Base) MCG/ACT inhaler Inhale 1-2 puffs into the lungs every 6 (six) hours as needed for wheezing or shortness of breath. Follow-up appt is due must see provider for future refills 02/17/21  Yes Hoyt Koch, MD  amiodarone (PACERONE) 200 MG tablet Take 1 tablet (200 mg total) by mouth daily. 02/04/21  Yes Shirley Friar, PA-C  budesonide-formoterol [facility]) 160-4.5 MCG/ACT inhaler Inhale 2 puffs into the lungs 2 (two) times daily as  needed. Follow-up appt is due must see provider for future refills Patient taking differently: Inhale 2 puffs into the lungs 2 (two) times daily as needed (sob/wheezing). Follow-up appt is due must see provider for future refills 02/17/21  Yes Hoyt Koch, MD  diclofenac (VOLTAREN) 75 MG EC tablet Take 1 tablet by mouth twice daily Patient taking differently: 75 mg 2 (two) times daily as needed for moderate pain. 04/21/21  Yes Hoyt Koch, MD  diclofenac Sodium (VOLTAREN) 1 % GEL Apply 4 g topically 4 (four) times daily. Patient taking differently: Apply 4 g topically 4 (four) times daily as needed (pain). 05/30/20  Yes Hoyt Koch, MD  dicyclomine (BENTYL) 20 MG tablet Take 1 tablet (20 mg total) by mouth daily as needed for spasms. 05/30/20  Yes Hoyt Koch, MD  empagliflozin (JARDIANCE) 10 MG TABS tablet Take 1 tablet (10 mg total) by mouth daily. 08/27/20  Yes Rai, Ripudeep K, MD  fluticasone (FLONASE) 50 MCG/ACT nasal spray Place 2 sprays into both nostrils daily as needed for allergies. Follow-up appt is due must see provider for future refills 02/17/21  Yes Hoyt Koch, MD  gabapentin (NEURONTIN) 300 MG capsule Take 1 capsule (300 mg total) by mouth at bedtime as needed (back spasms). 05/30/20  Yes Hoyt Koch, MD  ipratropium-albuterol (DUONEB) 0.5-2.5 (3) MG/3ML SOLN Take 3 mLs by nebulization every 6 (six) hours as needed. Patient taking differently: Take 3 mLs by nebulization every 6 (six) hours as needed (asthma). 11/17/19 11/29/21 Yes  Terrilee Croak, MD  ivabradine (CORLANOR) 7.5 MG TABS tablet Take 1 tablet (7.5 mg total) by mouth 2 (two) times daily with a meal. 09/02/20  Yes Bensimhon, Shaune Pascal, MD  lidocaine (LIDODERM) 5 % Place 1 patch onto the skin daily. Remove & Discard patch within 12 hours or as directed by MD Patient taking differently: Place 1 patch onto the skin daily as needed (pain). Remove & Discard patch within 12 hours or as  directed by MD 05/30/20  Yes Hoyt Koch, MD  Magnesium Oxide 200 MG TABS Take 1 tablet (200 mg total) by mouth daily. 05/30/20  Yes Bensimhon, Shaune Pascal, MD  metoprolol succinate (TOPROL-XL) 50 MG 24 hr tablet Take 1 tablet (50 mg total) by mouth daily. Take with or immediately following a meal. 08/28/20  Yes Rai, Ripudeep K, MD  Multiple Vitamins-Minerals (MULTIVITAMIN GUMMIES WOMENS PO) Take 2 tablets by mouth daily.   Yes [provider]  nitroGLYCERIN (NITROSTAT) 0.4 MG SL tablet Place 1 tablet (0.4 mg total) under the tongue every 5 (five) minutes as needed for chest pain. 05/30/20  Yes Bensimhon, Shaune Pascal, MD  potassium chloride SA (KLOR-CON M) 20 MEQ tablet Take 20 mEq by mouth daily. 04/20/21  Yes [provider]  sacubitril-valsartan (ENTRESTO) 49-51 MG Take 1 tablet by mouth 2 (two) times daily. 12/24/20  Yes Mil[place], Maricela Bo, FNP  spironolactone (ALDACTONE) 25 MG tablet Take 1 tablet (25 mg total) by mouth at bedtime. 01/14/21  Yes Bensimhon, Shaune Pascal, MD  tiZANidine (ZANAFLEX) 2 MG tablet Take 1 tablet (2 mg total) by mouth at bedtime. Patient taking differently: Take 2 mg by mouth at bedtime as needed for muscle spasms. 04/29/21  Yes Lyndal Pulley, DO  torsemide (DEMADEX) 20 MG tablet Take 20 mg by mouth daily.   Yes [provider]  Vitamin D, Ergocalciferol, (DRISDOL) 1.25 MG (50000 UNIT) CAPS capsule Take 1 capsule (50,000 Units total) by mouth every 7 (seven) days. 05/30/20  Yes Hoyt Koch, MD  Levonorgestrel-Ethinyl Estradiol (AMETHIA) 0.15-0.03 &0.01 MG tablet Take 1 tablet by mouth daily. 02/28/21   Hoyt Koch, MD  omeprazole (PRILOSEC) 20 MG capsule Take 1 capsule (20 mg total) by mouth daily as needed (for reflux symptoms). Patient not taking: Reported on 05/31/2021 06/18/20 09/13/22  Hoyt Koch, MD      Allergies    Vancomycin, Aspirin, Contrast media [iodinated contrast media], Cyclobenzaprine, Ciprofloxacin, Farxiga  [dapagliflozin], and Sulfa antibiotics    Review of Systems   Review of Systems  Constitutional:  Negative for chills and fever.  HENT:  Negative for congestion.   Respiratory:  Negative for shortness of breath.   Cardiovascular:  Positive for chest pain. Negative for palpitations.  Gastrointestinal:  Negative for abdominal pain, nausea and vomiting.  Genitourinary:  Negative for enuresis.  Musculoskeletal:  Negative for back pain.  Skin:  Negative for rash.   Physical Exam Updated Vital Signs BP 130/78    Pulse 72    Temp 98.3 F (36.8 C)    Resp 11    Ht 5\' 5"  (1.651 m)    Wt 95.3 kg    SpO2 95%    BMI 34.95 kg/m  Physical Exam Vitals and nursing note reviewed.  Constitutional:      General: She is not in acute distress.    Appearance: She is not ill-appearing.  HENT:     Head: Normocephalic and atraumatic.     Nose: No congestion.  Eyes:  Conjunctiva/sclera: Conjunctivae normal.  Cardiovascular:     Rate and Rhythm: Normal rate and regular rhythm.     Pulses: Normal pulses.     Heart sounds: No murmur heard.   No friction rub. No gallop.  Pulmonary:     Effort: No respiratory distress.     Breath sounds: No wheezing, rhonchi or rales.  Abdominal:     Palpations: Abdomen is soft.     Tenderness: There is no abdominal tenderness. There is no right CVA tenderness or left CVA tenderness.  Musculoskeletal:     Right lower leg: No edema.     Left lower leg: No edema.  Skin:    General: Skin is warm and dry.  Neurological:     Mental Status: She is alert.  Psychiatric:        Mood and Affect: Mood normal.    ED Results / Procedures / Treatments   Labs (all labs ordered are listed, but only abnormal results are displayed) Labs Reviewed  BASIC METABOLIC PANEL - Abnormal; Notable for the following components:      Result Value   Creatinine, Ser 1.02 (*)    All other components within normal limits  BRAIN NATRIURETIC PEPTIDE - Abnormal; Notable for the following  components:   B Natriuretic Peptide 332.6 (*)    All other components within normal limits  RESP PANEL BY RT-PCR (FLU A&B, COVID) ARPGX2  CBC  I-STAT BETA HCG BLOOD, ED ([facility], [facility], [facility] ONLY)  TROPONIN I (HIGH SENSITIVITY)  TROPONIN I (HIGH SENSITIVITY)    EKG  Radiology DG Chest 2 View  Result Date: 05/31/2021 CLINICAL DATA:  Chest pain. EXAM: CHEST - 2 VIEW COMPARISON:  08/25/2020 FINDINGS: Left-sided pacemaker in place with leads projecting over the right ventricle. The heart is upper normal in size with normal mediastinal contours. Right-sided battery pack with lead tip coursing into the neck. No focal airspace disease, pulmonary edema, pleural effusion, or pneumothorax. No acute osseous abnormalities are seen. IMPRESSION: No acute chest findings. Electronically Signed   By: Keith Rake M.D.   On: 05/31/2021 20:07    Procedures Procedures    Medications Ordered in ED Medications  ibuprofen (ADVIL) tablet 600 mg (has no administration in time range)  oxyCODONE-acetaminophen (PERCOCET/ROXICET) 5-325 MG per tablet 1 tablet (1 tablet Oral Given 05/31/21 2236)    ED Course/ Medical Decision Making/ A&P                           Medical Decision Making  This patient presents to the ED for concern of chest pain, this involves an extensive number of treatment options, and is a complaint that carries with it a high risk of complications and morbidity.  The differential diagnosis includes ACS, PE,    Additional history obtained:  Additional history obtained from electronic medical record External records from outside source obtained and reviewed including previous cardiology notes Dr. Marland Kitchen   Co morbidities that complicate the patient evaluation  Diabetes, hypertension, CHF  Social Determinants of Health:  N/A    Lab Tests:  I Ordered, and personally interpreted labs.  The pertinent results include: CBC unremarkable, BMP shows creatinine 1.02 BNP 332 respiratory panel  unremarkable negative delta troponins hCG less less than 5   Imaging Studies ordered:  I ordered imaging studies including chest x-ray I independently visualized and interpreted imaging which showed unremarkable I agree with the radiologist interpretation   Cardiac Monitoring:  The patient was  maintained on a cardiac monitor.  I personally viewed and interpreted the cardiac monitored which showed an underlying rhythm of: EKG sinus without signs of ischemia   Medicines ordered and prescription drug management:  I ordered medication including oxycodone, ibuprofen for pain I have reviewed the patients home medicines and have made adjustments as needed   Reevaluation:  After the interventions noted above, I reevaluated the patient and found that they have :improved  Patient is reassessed updated lab and imaging she has no complaints this time, she does agree for discharge.   Test Considered:  D-dimer but will defer as a very low suspicion for PE at this time she is PERC negative.    Rule out I have low suspicion for ACS as history is atypical, EKG was sinus rhythm without signs of ischemia, patient had a delta troponin.  Low suspicion for PE as patient denies pleuritic chest pain, shortness of breath, patient denies leg pain, no pedal edema noted on exam, patient was PERC negative.  Low suspicion for AAA or aortic dissection as history is atypical, patient has low risk factors.  Low suspicion for systemic infection as patient is nontoxic-appearing, vital signs reassuring, no obvious source infection noted on exam.     Dispostion and problem list  After consideration of the diagnostic results and the patients response to treatment, I feel that the patent would benefit from   Atypical chest pain-unclear etiology but I suspect this is more muscular as it is intermittent, will have her continue with over-the-counter pain medications, follow-up with cardiology for further  evaluation, gave strict return precautions..             Final Clinical Impression(s) / ED Diagnoses Final diagnoses:  Atypical chest pain    Rx / DC Orders ED Discharge Orders     None         Marcello Fennel, PA-C 05/31/21 2339    Pattricia Boss, MD 06/01/21 2152

## 2021-05-31 NOTE — Discharge Instructions (Signed)
Lab work and imaging are all reassuring, I would like extended all home medications, you may take over-the-counter pain medication as needed.  If your symptoms persist over the next couple days I would like to follow-up with your cardiologist for further evaluation.  Come back to the emergency department if you develop chest pain, shortness of breath, severe abdominal pain, uncontrolled nausea, vomiting, diarrhea.

## 2021-05-31 NOTE — ED Provider Triage Note (Signed)
Emergency Medicine Provider Triage Evaluation Note  [name] , a 46 y.o. female  was evaluated in triage.  Pt complains of chest pain and headache that began yesterday.  Reports that the pains feel like squeezing in her chest and the headache started at the same time.  History of congestive heart failure.  Has not been experiencing any shortness of breath or symptoms of fluid overload.  Review of Systems  Positive: Chest pain, headache, palpitations Negative: Shortness of breath, lower extremity edema  Physical Exam  BP (!) 143/96 (BP Location: Right Arm)    Pulse 78    Temp (!) 97.4 F (36.3 C) (Oral)    Resp 16    Ht 5\' 5"  (1.651 m)    Wt 95.3 kg    SpO2 100%    BMI 34.95 kg/m  Gen:   Awake, no distress   Resp:  Normal effort  MSK:   Moves extremities without difficulty  Other:  Lung sounds clear, regular rate, occasional extra beat.  Medical Decision Making  Medically screening exam initiated at 7:11 PM.  Appropriate orders placed.  ARRIE BORRELLI was informed that the remainder of the evaluation will be completed by another provider, this initial triage assessment does not replace that evaluation, and the importance of remaining in the ED until their evaluation is complete.     Rhae Hammock, [place] 05/31/21 1916

## 2021-05-31 NOTE — ED Triage Notes (Signed)
Pt endorses CP and HA since yesterday. Central CP that radiates down left ribcage and arm. Pt has AICD.

## 2021-06-03 ENCOUNTER — Ambulatory Visit: Payer: HMO | Admitting: Family Medicine

## 2021-06-03 ENCOUNTER — Other Ambulatory Visit: Payer: Self-pay | Admitting: Family Medicine

## 2021-06-04 NOTE — Progress Notes (Signed)
No ICM remote transmission received for 06/03/2021 and next ICM transmission scheduled for 06/10/2021.

## 2021-06-05 ENCOUNTER — Encounter: Payer: Self-pay | Admitting: Internal Medicine

## 2021-06-06 ENCOUNTER — Ambulatory Visit: Payer: HMO | Admitting: Internal Medicine

## 2021-06-07 ENCOUNTER — Ambulatory Visit
Admission: EM | Admit: 2021-06-07 | Discharge: 2021-06-07 | Disposition: A | Discharge status: Home / Self Care | Payer: HMO | Attending: Internal Medicine | Admitting: Internal Medicine

## 2021-06-07 ENCOUNTER — Encounter: Payer: Self-pay | Admitting: Emergency Medicine

## 2021-06-07 ENCOUNTER — Ambulatory Visit (INDEPENDENT_AMBULATORY_CARE_PROVIDER_SITE_OTHER): Payer: HMO

## 2021-06-07 ENCOUNTER — Other Ambulatory Visit: Payer: Self-pay

## 2021-06-07 DIAGNOSIS — M79672 Pain in left foot: Secondary | ICD-10-CM

## 2021-06-07 DIAGNOSIS — M19072 Primary osteoarthritis, left ankle and foot: Secondary | ICD-10-CM | POA: Diagnosis not present

## 2021-06-07 DIAGNOSIS — M7732 Calcaneal spur, left foot: Secondary | ICD-10-CM | POA: Diagnosis not present

## 2021-06-07 NOTE — ED Provider Notes (Addendum)
[facility]    CSN: [id number] Arrival date & time: 06/07/21  1504      History   Chief Complaint Chief Complaint  Patient presents with   Foot Pain    HPI [name] is a 46 y.o. female.   Patient presents with left foot pain that has been present for approximately 3 days.  Denies any recent injuries.  Foot pain that occurs on the left lateral foot.  Pain is exacerbated with movement.  Denies any numbness or tingling.  Patient does report that she has had a fracture of that foot in the past.   Foot Pain   Past Medical History:  Diagnosis Date   Acute on chronic systolic (congestive) heart failure (HCC) 04/29/2017   Acute pain of right shoulder 06/16/2017   AICD (automatic cardioverter/defibrillator) present    Anginal pain (HCC)    Anxiety    Arthritis    right shoulder    Asthma    CHF (congestive heart failure) ([place])    Chronic combined systolic and diastolic heart failure ([place]) 03/05/2014   Closed low lateral malleolus fracture 10/23/2013   Cystitis 10/21/2017   Depression    Depression with anxiety 01/20/2013   Diabetes mellitus without complication (HCC)    Diverticulosis    Dyspnea    comes and goes intermittently mostly with exertion    Dysrhythmia    Essential hypertension    Prev followed by [name]    Fibroid    age 46   Gallstones    Generalized abdominal cramping    History of cardiomyopathy    Hypertension    IBS (irritable bowel syndrome)    ICD (implantable cardioverter-defibrillator), single, 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 CPAP intolerance >> "smothering" Good tolerance of auto device (optimal pressure 12-13 on download).  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   PVC (premature ventricular contraction) 06/23/2016   Seizures ([place])    "as a child" (08/03/2016)   Termination of pregnancy    due to cardiac risk    Patient Active Problem List   Diagnosis Date Noted   Right wrist pain 04/11/2021   Birth control counseling 02/28/2021   Obesity 02/28/2021   Greater trochanteric bursitis of right hip 12/26/2020   Congestive heart failure, NYHA class III ([place]) 11/08/2020   Asthma 08/25/2020   Iron deficiency anemia due to chronic blood loss 08/06/2020   Greater trochanteric bursitis, left 07/01/2020   V-tach 11/12/2019   Asthma with acute exacerbation 09/14/2019   Left hip pain 03/21/2019   Headache 10/26/2018   Chronic right-sided low back pain without sciatica 06/24/2018   Trigger point of shoulder region, right 06/13/2018   Patellar subluxation, left, initial encounter 04/25/2018   Left flank pain 01/14/2018   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   Myofascial  pain 06/16/2017   Insomnia 05/07/2017   History of cardiomyopathy    Acute on chronic systolic (congestive) heart failure ([place]) 04/29/2017   Nonspecific chest pain 04/28/2017   ICD (implantable cardioverter-defibrillator), single, 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   Patellofemoral syndrome of both knees 10/16/2016   NICM (nonischemic cardiomyopathy) ([place]) 08/03/2016   PVC (premature ventricular contraction) 06/23/2016   Labral tear of shoulder 04/04/2015   Chronic combined systolic and diastolic heart failure ([place]) 03/05/2014   IBS (irritable bowel syndrome)    Depression with anxiety 01/20/2013   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   Essential hypertension     Past Surgical History:  Procedure Laterality Date   CARDIAC CATHETERIZATION N/A 11/11/2015   Procedure: Right/Left Heart Cath and Coronary Angiography;  Surgeon: Troy Sine, MD;  Location: [facility];  Service: Cardiovascular;  Laterality: N/A;   CARDIAC CATHETERIZATION  ~ 2015   CARDIAC DEFIBRILLATOR PLACEMENT  08/03/2016   CESAREAN SECTION  1999   COLONOSCOPY WITH PROPOFOL N/A 04/21/2016   Procedure: COLONOSCOPY WITH PROPOFOL;  Surgeon: Jerene Bears, MD;  Location: [facility];  Service: Gastroenterology;  Laterality: N/A;   ESOPHAGOGASTRODUODENOSCOPY (EGD) WITH PROPOFOL N/A 04/21/2016   Procedure: ESOPHAGOGASTRODUODENOSCOPY (EGD) WITH PROPOFOL;  Surgeon: Jerene Bears, MD;  Location: [facility];  Service: Gastroenterology;  Laterality: N/A;   FOOT FRACTURE SURGERY Right ~ 2003   FRACTURE SURGERY     ICD IMPLANT N/A 08/03/2016   Procedure: ICD Implant;  Surgeon: Deboraha Sprang, MD;  Location: [facility];  Service: Cardiovascular;  Laterality: N/A;   LAPAROSCOPIC CHOLECYSTECTOMY  12/2006   LAPAROSCOPIC GASTRIC SLEEVE RESECTION     LAPAROSCOPY ABDOMEN DIAGNOSTIC  2008   "cut bile duct w/gallbladder OR; had to go in later & fix leak; hospitalized for 2 months"   LEFT HEART CATHETERIZATION WITH CORONARY ANGIOGRAM N/A 02/26/2014   Procedure: LEFT HEART CATHETERIZATION WITH CORONARY ANGIOGRAM;  Surgeon: Jettie Booze, MD;  [facility];  Service: Cardiovascular;  Laterality: N/A;   OVARIAN CYST REMOVAL Right 1999   PVC ABLATION N/A 12/01/2017   Procedure: PVC ABLATION;  Surgeon: Evans Lance, MD;  Location: [facility];  Service: Cardiovascular;  Laterality: N/A;   RIGHT HEART CATH N/A 08/05/2017   Procedure: RIGHT HEART CATH;  Surgeon: Jolaine Artist, MD;  Location: [facility];  Service: Cardiovascular;  Laterality: N/A;   RIGHT HEART CATH N/A 11/16/2019   Procedure: RIGHT HEART  CATH;  Surgeon: Jolaine Artist, MD;  Location: [facility];  Service: Cardiovascular;  Laterality: N/A;   TUBAL LIGATION  1999    OB History     Gravida  2   Para  1   Term      Preterm      AB  1   Living  1      SAB      IAB  1   Ectopic      Multiple      Live Births               Home Medications    Prior to Admission medications   Medication Sig Start Date End Date Taking? Authorizing Provider  acetaminophen (TYLENOL) 650 MG CR tablet Take 650 mg by mouth every 8 (eight) hours as needed for pain (headache).   Yes [provider]  albuterol (PROAIR HFA) 108 (90 Base) MCG/ACT inhaler Inhale 1-2 puffs into the lungs every 6 (six) hours as needed  for wheezing or shortness of breath. Follow-up appt is due must see provider for future refills 02/17/21  Yes Hoyt Koch, MD  amiodarone (PACERONE) 200 MG tablet Take 1 tablet (200 mg total) by mouth daily. 02/04/21  Yes Shirley Friar, PA-C  budesonide-formoterol [facility]) 160-4.5 MCG/ACT inhaler Inhale 2 puffs into the lungs 2 (two) times daily as needed. Follow-up appt is due must see provider for future refills Patient taking differently: Inhale 2 puffs into the lungs 2 (two) times daily as needed (sob/wheezing). Follow-up appt is due must see provider for future refills 02/17/21  Yes Hoyt Koch, MD  diclofenac (VOLTAREN) 75 MG EC tablet Take 1 tablet by mouth twice daily Patient taking differently: 75 mg 2 (two) times daily as needed for moderate pain. 04/21/21  Yes Hoyt Koch, MD  diclofenac Sodium (VOLTAREN) 1 % GEL Apply 4 g topically 4 (four) times daily. Patient taking differently: Apply 4 g topically 4 (four) times daily as needed (pain). 05/30/20  Yes Hoyt Koch, MD  dicyclomine (BENTYL) 20 MG tablet Take 1 tablet (20 mg total) by mouth daily as needed for spasms. 05/30/20  Yes Hoyt Koch, MD  empagliflozin (JARDIANCE) 10 MG TABS  tablet Take 1 tablet (10 mg total) by mouth daily. 08/27/20  Yes Rai, Ripudeep K, MD  fluticasone (FLONASE) 50 MCG/ACT nasal spray Place 2 sprays into both nostrils daily as needed for allergies. Follow-up appt is due must see provider for future refills 02/17/21  Yes Hoyt Koch, MD  gabapentin (NEURONTIN) 300 MG capsule Take 1 capsule (300 mg total) by mouth at bedtime as needed (back spasms). 05/30/20  Yes Hoyt Koch, MD  ipratropium-albuterol (DUONEB) 0.5-2.5 (3) MG/3ML SOLN Take 3 mLs by nebulization every 6 (six) hours as needed. Patient taking differently: Take 3 mLs by nebulization every 6 (six) hours as needed (asthma). 11/17/19 11/29/21 Yes Dahal, Marlowe Aschoff, MD  ivabradine (CORLANOR) 7.5 MG TABS tablet Take 1 tablet (7.5 mg total) by mouth 2 (two) times daily with a meal. 09/02/20  Yes Bensimhon, Shaune Pascal, MD  Levonorgestrel-Ethinyl Estradiol (AMETHIA) 0.15-0.03 &0.01 MG tablet Take 1 tablet by mouth daily. 02/28/21  Yes Hoyt Koch, MD  lidocaine (LIDODERM) 5 % Place 1 patch onto the skin daily. Remove & Discard patch within 12 hours or as directed by MD Patient taking differently: Place 1 patch onto the skin daily as needed (pain). Remove & Discard patch within 12 hours or as directed by MD 05/30/20  Yes Hoyt Koch, MD  Magnesium Oxide 200 MG TABS Take 1 tablet (200 mg total) by mouth daily. 05/30/20  Yes Bensimhon, Shaune Pascal, MD  metoprolol succinate (TOPROL-XL) 50 MG 24 hr tablet Take 1 tablet (50 mg total) by mouth daily. Take with or immediately following a meal. 08/28/20  Yes Rai, Ripudeep K, MD  Multiple Vitamins-Minerals (MULTIVITAMIN GUMMIES WOMENS PO) Take 2 tablets by mouth daily.   Yes [provider]  nitroGLYCERIN (NITROSTAT) 0.4 MG SL tablet Place 1 tablet (0.4 mg total) under the tongue every 5 (five) minutes as needed for chest pain. 05/30/20  Yes Bensimhon, Shaune Pascal, MD  omeprazole (PRILOSEC) 20 MG capsule Take 1 capsule (20 mg total) by mouth  daily as needed (for reflux symptoms). 06/18/20 09/13/22 Yes Hoyt Koch, MD  potassium chloride SA (KLOR-CON M) 20 MEQ tablet Take 20 mEq by mouth daily. 04/20/21  Yes [provider]  sacubitril-valsartan (ENTRESTO) 49-51 MG Take 1 tablet by mouth 2 (two)  times daily. 12/24/20  Yes [place], Maricela Bo, FNP  spironolactone (ALDACTONE) 25 MG tablet Take 1 tablet (25 mg total) by mouth at bedtime. 01/14/21  Yes Bensimhon, Shaune Pascal, MD  tiZANidine (ZANAFLEX) 2 MG tablet TAKE 1 TABLET BY MOUTH AT BEDTIME 06/03/21  Yes Hulan Saas M, DO  torsemide (DEMADEX) 20 MG tablet Take 20 mg by mouth daily.   Yes [provider]  Vitamin D, Ergocalciferol, (DRISDOL) 1.25 MG (50000 UNIT) CAPS capsule Take 1 capsule (50,000 Units total) by mouth every 7 (seven) days. 05/30/20  Yes Hoyt Koch, MD    Family History Family History  Problem Relation Age of Onset   Emphysema Maternal Grandmother        smoked   Heart disease Maternal Grandmother 72       MI   Rheum arthritis Mother    Allergies Daughter    Colon cancer Neg Hx     Social History Social History   Tobacco Use   Smoking status: Never   Smokeless tobacco: Never  Vaping Use   Vaping Use: Never used  Substance Use Topics   Alcohol use: No   Drug use: No     Allergies   Vancomycin, Aspirin, Contrast media [iodinated contrast media], Cyclobenzaprine, Ciprofloxacin, Farxiga [dapagliflozin], and Sulfa antibiotics   Review of Systems Review of Systems Per HPI  Physical Exam Triage Vital Signs ED Triage Vitals [06/07/21 1523]  Enc Vitals Group     BP (!) 152/76     Pulse Rate 68     Resp 18     Temp 98.1 F (36.7 C)     Temp Source Oral     SpO2 98 %     Weight 210 lb 1.6 oz (95.3 kg)     Height 5\' 5"  (1.651 m)     Head Circumference      Peak Flow      Pain Score 7     Pain Loc      Pain Edu?      Excl. in [place]?    No data found.  Updated Vital Signs BP (!) 152/76 (BP Location: Left Arm)     Pulse 68    Temp 98.1 F (36.7 C) (Oral)    Resp 18    Ht 5\' 5"  (1.651 m)    Wt 210 lb 1.6 oz (95.3 kg)    LMP 05/28/2021    SpO2 98%    BMI 34.96 kg/m   Visual Acuity Right Eye Distance:   Left Eye Distance:   Bilateral Distance:    Right Eye Near:   Left Eye Near:    Bilateral Near:     Physical Exam Constitutional:      General: She is not in acute distress.    Appearance: Normal appearance. She is not toxic-appearing or diaphoretic.  HENT:     Head: Normocephalic and atraumatic.  Eyes:     Extraocular Movements: Extraocular movements intact.     Conjunctiva/sclera: Conjunctivae normal.  Pulmonary:     Effort: Pulmonary effort is normal.  Musculoskeletal:       Feet:     Comments: Mild swelling with associated tenderness to palpation noted at circled area on diagram at left lateral foot.  Neurovascular intact.  Patient has full range of motion of ankle and toes.  Neurological:     General: No focal deficit present.     Mental Status: She is alert and oriented to person, place, and time. Mental status  is at baseline.  Psychiatric:        Mood and Affect: Mood normal.        Behavior: Behavior normal.        Thought Content: Thought content normal.        Judgment: Judgment normal.     UC Treatments / Results  Labs (all labs ordered are listed, but only abnormal results are displayed) Labs Reviewed - No data to display  EKG   Radiology DG Foot Complete Left  Result Date: 06/07/2021 CLINICAL DATA:  Pain EXAM: LEFT FOOT - COMPLETE 3+ VIEW COMPARISON:  None. FINDINGS: No recent fracture or dislocation is seen. There is 2 mm smooth marginated calcification adjacent to the base of fifth metatarsal. There is moderate sized plantar spur in the calcaneus. There is small calcification at the attachment of Achilles tendon to the calcaneus possibly suggesting calcific bursitis or tendinosis. Small bony spurs seen in the first tarsometatarsal joint and first  metatarsophalangeal joint. IMPRESSION: No recent fracture or dislocation is seen. 2 mm smooth marginated calcification adjacent to the base of fifth metatarsal may suggest old avulsion. Degenerative changes are noted with small bony spurs in the first tarsometatarsal and first metatarsophalangeal joints. There is moderate sized plantar spur in the calcaneus. Small calcification at the attachment of Achilles tendon to the calcaneus may suggest calcific bursitis or tendinosis. Electronically Signed   By: Elmer Picker M.D.   On: 06/07/2021 16:19    Procedures Procedures (including critical care time)  Medications Ordered in UC Medications - No data to display  Initial Impression / Assessment and Plan / UC Course  I have reviewed the triage vital signs and the nursing notes.  Pertinent labs & imaging results that were available during my care of the patient were reviewed by me and considered in my medical decision making (see chart for details).     Left foot x-ray was negative for any acute bony abnormality.  It did show calcification that indicates old avulsion fracture.  Patient reports that this is a consistent with her old fracture.  It showed some degenerative changes as well as spurring but no acute obvious abnormalities.  No obvious abnormality found on physical exam.   Discussed supportive care and ice application.  Patient to use Tylenol as needed given patient's comorbidities.  Ice application.  Patient to follow-up with orthopedist.  She reports that she already has an established orthopedist. Patient verbalized understanding and was agreeable with  plan. Final Clinical Impressions(s) / UC Diagnoses   Final diagnoses:  Left foot pain     Discharge Instructions      Please use ice application and follow-up with orthopedist for further evaluation and management.     ED Prescriptions   None    PDMP not reviewed this encounter.   Teodora Medici, [place] 06/07/21 [place], [place], [place] 06/07/21 [phone]

## 2021-06-07 NOTE — ED Triage Notes (Signed)
Patient c/o left foot pain x 3 days.  No apparent injury, hurts when she steps like the bend of the foot.

## 2021-06-07 NOTE — Discharge Instructions (Signed)
Please use ice application and follow-up with orthopedist for further evaluation and management.

## 2021-06-09 ENCOUNTER — Encounter: Payer: HMO | Admitting: Student

## 2021-06-10 ENCOUNTER — Encounter: Payer: Self-pay | Admitting: Internal Medicine

## 2021-06-11 MED ORDER — UBRELVY 100 MG PO TABS: 100.0000 mg | ORAL_TABLET | Freq: Every day | ORAL | 3 refills | Status: DC | PRN

## 2021-06-12 ENCOUNTER — Ambulatory Visit: Payer: HMO | Admitting: Family Medicine

## 2021-06-12 NOTE — Progress Notes (Signed)
No ICM remote transmission received for 06/10/2021 and next ICM transmission scheduled for 06/24/2021.

## 2021-06-18 ENCOUNTER — Ambulatory Visit (INDEPENDENT_AMBULATORY_CARE_PROVIDER_SITE_OTHER): Payer: HMO

## 2021-06-18 DIAGNOSIS — I428 Other cardiomyopathies: Secondary | ICD-10-CM

## 2021-06-19 NOTE — Progress Notes (Deleted)
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  Attempted osteopathic manipulation with some improvement.  Mainly think it is more the sacroiliac joint and does have the underlying greater trochanteric bursitis we will continue to monitor.  Discussed the topical anti-inflammatories.  Discussed Tylenol.  Patient is avoiding oral anti-inflammatories were possible.  Discussed icing regimen.  Follow-up with me again in 6 to 8 weeks  Updated 06/20/2021 [name] is a 46 y.o. female coming in with complaint of right side pain  Onset-  Location Duration-  Character- Aggravating factors- Reliving factors-  Therapies tried-  Severity-     Past Medical History:  Diagnosis Date   Acute on chronic systolic (congestive) heart failure (HCC) 04/29/2017   Acute pain of right shoulder 06/16/2017   AICD (automatic cardioverter/defibrillator) present    Anginal pain (HCC)    Anxiety    Arthritis    right shoulder    Asthma    CHF (congestive heart failure) ([place])    Chronic combined systolic and diastolic heart failure ([place]) 03/05/2014   Closed low lateral malleolus fracture 10/23/2013   Cystitis 10/21/2017   Depression    Depression with anxiety 01/20/2013   Diabetes mellitus without complication (HCC)    Diverticulosis    Dyspnea    comes and goes intermittently mostly with exertion    Dysrhythmia    Essential hypertension    Prev followed by [name]    Fibroid    age 35   Gallstones    Generalized abdominal cramping    History of cardiomyopathy    Hypertension    IBS (irritable bowel syndrome)    ICD (implantable cardioverter-defibrillator), single, 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 CPAP intolerance >> "smothering" Good tolerance of auto device (optimal pressure 12-13 on download).  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   PVC (premature ventricular contraction) 06/23/2016   Seizures ([place])    "as a child" (08/03/2016)   Termination of pregnancy    due to cardiac risk   Past Surgical History:  Procedure Laterality Date   CARDIAC CATHETERIZATION N/A 11/11/2015   Procedure: Right/Left Heart Cath and Coronary Angiography;  Surgeon: Troy Sine, MD;  Location: [facility];  Service: Cardiovascular;  Laterality: N/A;   CARDIAC CATHETERIZATION  ~ 2015   CARDIAC DEFIBRILLATOR PLACEMENT  08/03/2016   CESAREAN SECTION  1999   COLONOSCOPY WITH PROPOFOL N/A 04/21/2016   Procedure: COLONOSCOPY WITH PROPOFOL;  Surgeon: Jerene Bears, MD;  Location: [facility];  Service: Gastroenterology;  Laterality: N/A;   ESOPHAGOGASTRODUODENOSCOPY (EGD) WITH PROPOFOL N/A 04/21/2016   Procedure: ESOPHAGOGASTRODUODENOSCOPY (  EGD) WITH PROPOFOL;  Surgeon: Jerene Bears, MD;  Location: [facility];  Service: Gastroenterology;  Laterality: N/A;   FOOT FRACTURE SURGERY Right ~ 2003   FRACTURE SURGERY     ICD IMPLANT N/A 08/03/2016   Procedure: ICD Implant;  Surgeon: Deboraha Sprang, MD;  Location: [facility];  Service: Cardiovascular;  Laterality: N/A;   LAPAROSCOPIC CHOLECYSTECTOMY  12/2006   LAPAROSCOPIC GASTRIC SLEEVE  RESECTION     LAPAROSCOPY ABDOMEN DIAGNOSTIC  2008   "cut bile duct w/gallbladder OR; had to go in later & fix leak; hospitalized for 2 months"   LEFT HEART CATHETERIZATION WITH CORONARY ANGIOGRAM N/A 02/26/2014   Procedure: LEFT HEART CATHETERIZATION WITH CORONARY ANGIOGRAM;  Surgeon: Jettie Booze, MD;  Location: [facility];  Service: Cardiovascular;  Laterality: N/A;   OVARIAN CYST REMOVAL Right 1999   PVC ABLATION N/A 12/01/2017   Procedure: PVC ABLATION;  Surgeon: Evans Lance, MD;  Location: [facility];  Service: Cardiovascular;  Laterality: N/A;   RIGHT HEART CATH N/A 08/05/2017   Procedure: RIGHT HEART CATH;  Surgeon: Jolaine Artist, MD;  Location: [facility];  Service: Cardiovascular;  Laterality: N/A;   RIGHT HEART CATH N/A 11/16/2019   Procedure: RIGHT HEART CATH;  Surgeon: Jolaine Artist, MD;  Location: [facility];  Service: Cardiovascular;  Laterality: N/A;   TUBAL LIGATION  1999   Social History   Socioeconomic History   Marital status: Married    Spouse name: Not on file   Number of children: 1   Years of education: Not on file   Highest education level: Not on file  Occupational History   Occupation: stay at home mom  Tobacco Use   Smoking status: Never   Smokeless tobacco: Never  Vaping Use   Vaping Use: Never used  Substance and Sexual Activity   Alcohol use: No   Drug use: No   Sexual activity: Yes  Other Topics Concern   Not on file  Social History Narrative   Not on file   Social Determinants of Health   Financial Resource Strain: Not on file  Food Insecurity: Not on file  Transportation Needs: Not on file  Physical Activity: Not on file  Stress: Not on file  Social Connections: Not on file   Allergies  Allergen Reactions   Vancomycin Other (See Comments)    "did something to my kidneys," PROGRESSED TO KIDNEY FAILURE!!   Aspirin Other (See Comments)    Wheezing, (Pt states that she just wheezes some when  she takes aspirin by itself but she can take aspirin in a combination product).   Contrast Media [Iodinated Contrast Media] Other (See Comments)    Multiple CT contrast studies done over 2 weeks caused ARF   Cyclobenzaprine Other (See Comments)    Can not tolerate   Ciprofloxacin Itching and Rash   Farxiga [Dapagliflozin] Rash   Sulfa Antibiotics Itching and Rash   Family History  Problem Relation Age of Onset   Emphysema Maternal Grandmother        smoked   Heart disease Maternal Grandmother 76       MI   Rheum arthritis Mother    Allergies Daughter    Colon cancer Neg Hx     Current Outpatient Medications (Endocrine & Metabolic):    empagliflozin (JARDIANCE) 10 MG TABS tablet, Take 1 tablet (10 mg total) by mouth daily.   Levonorgestrel-Ethinyl Estradiol (AMETHIA) 0.15-0.03 &0.01 MG tablet,  Take 1 tablet by mouth daily.  Current Outpatient Medications (Cardiovascular):    amiodarone (PACERONE) 200 MG tablet, Take 1 tablet (200 mg total) by mouth daily.   ivabradine (CORLANOR) 7.5 MG TABS tablet, Take 1 tablet (7.5 mg total) by mouth 2 (two) times daily with a meal.   metoprolol succinate (TOPROL-XL) 50 MG 24 hr tablet, Take 1 tablet (50 mg total) by mouth daily. Take with or immediately following a meal.   nitroGLYCERIN (NITROSTAT) 0.4 MG SL tablet, Place 1 tablet (0.4 mg total) under the tongue every 5 (five) minutes as needed for chest pain.   sacubitril-valsartan (ENTRESTO) 49-51 MG, Take 1 tablet by mouth 2 (two) times daily.   spironolactone (ALDACTONE) 25 MG tablet, Take 1 tablet (25 mg total) by mouth at bedtime.   torsemide (DEMADEX) 20 MG tablet, Take 20 mg by mouth daily.  Current Outpatient Medications (Respiratory):    albuterol (PROAIR HFA) 108 (90 Base) MCG/ACT inhaler, Inhale 1-2 puffs into the lungs every 6 (six) hours as needed for wheezing or shortness of breath. Follow-up appt is due must see provider for future refills   budesonide-formoterol (SYMBICORT)  160-4.5 MCG/ACT inhaler, Inhale 2 puffs into the lungs 2 (two) times daily as needed. Follow-up appt is due must see provider for future refills (Patient taking differently: Inhale 2 puffs into the lungs 2 (two) times daily as needed (sob/wheezing). Follow-up appt is due must see provider for future refills)   fluticasone (FLONASE) 50 MCG/ACT nasal spray, Place 2 sprays into both nostrils daily as needed for allergies. Follow-up appt is due must see provider for future refills   ipratropium-albuterol (DUONEB) 0.5-2.5 (3) MG/3ML SOLN, Take 3 mLs by nebulization every 6 (six) hours as needed. (Patient taking differently: Take 3 mLs by nebulization every 6 (six) hours as needed (asthma).)  Current Outpatient Medications (Analgesics):    acetaminophen (TYLENOL) 650 MG CR tablet, Take 650 mg by mouth every 8 (eight) hours as needed for pain (headache).   diclofenac (VOLTAREN) 75 MG EC tablet, Take 1 tablet by mouth twice daily (Patient taking differently: 75 mg 2 (two) times daily as needed for moderate pain.)   Ubrogepant (UBRELVY) 100 MG TABS, Take 100 mg by mouth daily as needed (migraine).   Current Outpatient Medications (Other):    diclofenac Sodium (VOLTAREN) 1 % GEL, Apply 4 g topically 4 (four) times daily. (Patient taking differently: Apply 4 g topically 4 (four) times daily as needed (pain).)   dicyclomine (BENTYL) 20 MG tablet, Take 1 tablet (20 mg total) by mouth daily as needed for spasms.   gabapentin (NEURONTIN) 300 MG capsule, Take 1 capsule (300 mg total) by mouth at bedtime as needed (back spasms).   lidocaine (LIDODERM) 5 %, Place 1 patch onto the skin daily. Remove & Discard patch within 12 hours or as directed by MD (Patient taking differently: Place 1 patch onto the skin daily as needed (pain). Remove & Discard patch within 12 hours or as directed by MD)   Magnesium Oxide 200 MG TABS, Take 1 tablet (200 mg total) by mouth daily.   Multiple Vitamins-Minerals (MULTIVITAMIN GUMMIES  WOMENS PO), Take 2 tablets by mouth daily.   omeprazole (PRILOSEC) 20 MG capsule, Take 1 capsule (20 mg total) by mouth daily as needed (for reflux symptoms).   potassium chloride SA (KLOR-CON M) 20 MEQ tablet, Take 20 mEq by mouth daily.   tiZANidine (ZANAFLEX) 2 MG tablet, TAKE 1 TABLET BY MOUTH AT BEDTIME   Vitamin D, Ergocalciferol, (DRISDOL)  1.25 MG (50000 UNIT) CAPS capsule, Take 1 capsule (50,000 Units total) by mouth every 7 (seven) days.   Reviewed prior external information including notes and imaging from  primary care provider As well as notes that were available from care everywhere and other healthcare systems.  Past medical history, social, surgical and family history all reviewed in electronic medical record.  No pertanent information unless stated regarding to the chief complaint.   Review of Systems:  No headache, visual changes, nausea, vomiting, diarrhea, constipation, dizziness, abdominal pain, skin rash, fevers, chills, night sweats, weight loss, swollen lymph nodes, body aches, joint swelling, chest pain, shortness of breath, mood changes. POSITIVE muscle aches  Objective  Last menstrual period 05/28/2021.   General: No apparent distress alert and oriented x3 mood and affect normal, dressed appropriately.  HEENT: Pupils equal, extraocular movements intact  Respiratory: [name] speak in full sentences and does not appear short of breath  Cardiovascular: No lower extremity edema, non tender, no erythema  Gait normal with good balance and coordination.  MSK:  Non tender with full range of motion and good stability and symmetric strength and tone of shoulders, elbows, wrist, hip, knee and ankles bilaterally.     Impression and Recommendations:     The above documentation has been reviewed and is accurate and complete Belva Agee

## 2021-06-20 ENCOUNTER — Ambulatory Visit: Payer: HMO | Admitting: Family Medicine

## 2021-06-20 ENCOUNTER — Encounter (HOSPITAL_COMMUNITY): Payer: Self-pay | Admitting: Internal Medicine

## 2021-06-20 ENCOUNTER — Other Ambulatory Visit (HOSPITAL_COMMUNITY): Payer: Self-pay

## 2021-06-20 ENCOUNTER — Encounter: Payer: Self-pay | Admitting: Family Medicine

## 2021-06-20 MED ORDER — NITROGLYCERIN 0.4 MG SL SUBL: 0.4000 mg | SUBLINGUAL_TABLET | SUBLINGUAL | 0 refills | Status: AC | PRN

## 2021-06-24 ENCOUNTER — Ambulatory Visit (HOSPITAL_COMMUNITY)
Admission: RE | Admit: 2021-06-24 | Discharge: 2021-06-24 | Disposition: A | Discharge status: Home / Self Care | Payer: HMO | Source: Ambulatory Visit | Attending: Internal Medicine | Admitting: Internal Medicine

## 2021-06-24 ENCOUNTER — Ambulatory Visit (HOSPITAL_BASED_OUTPATIENT_CLINIC_OR_DEPARTMENT_OTHER)
Admission: RE | Admit: 2021-06-24 | Discharge: 2021-06-24 | Disposition: A | Discharge status: Home / Self Care | Payer: HMO | Source: Ambulatory Visit | Attending: Internal Medicine | Admitting: Internal Medicine

## 2021-06-24 ENCOUNTER — Ambulatory Visit (INDEPENDENT_AMBULATORY_CARE_PROVIDER_SITE_OTHER): Payer: HMO

## 2021-06-24 ENCOUNTER — Other Ambulatory Visit: Payer: Self-pay

## 2021-06-24 VITALS — BP 125/72 | HR 95 | Wt 217.0 lb

## 2021-06-24 DIAGNOSIS — I429 Cardiomyopathy, unspecified: Secondary | ICD-10-CM | POA: Diagnosis not present

## 2021-06-24 DIAGNOSIS — I5042 Chronic combined systolic (congestive) and diastolic (congestive) heart failure: Secondary | ICD-10-CM | POA: Diagnosis not present

## 2021-06-24 DIAGNOSIS — Z6836 Body mass index (BMI) 36.0-36.9, adult: Secondary | ICD-10-CM | POA: Insufficient documentation

## 2021-06-24 DIAGNOSIS — Z9884 Bariatric surgery status: Secondary | ICD-10-CM | POA: Diagnosis not present

## 2021-06-24 DIAGNOSIS — I493 Ventricular premature depolarization: Secondary | ICD-10-CM | POA: Diagnosis not present

## 2021-06-24 DIAGNOSIS — Z95810 Presence of automatic (implantable) cardiac defibrillator: Secondary | ICD-10-CM

## 2021-06-24 DIAGNOSIS — R5383 Other fatigue: Secondary | ICD-10-CM

## 2021-06-24 DIAGNOSIS — G4733 Obstructive sleep apnea (adult) (pediatric): Secondary | ICD-10-CM | POA: Insufficient documentation

## 2021-06-24 DIAGNOSIS — E119 Type 2 diabetes mellitus without complications: Secondary | ICD-10-CM | POA: Diagnosis not present

## 2021-06-24 DIAGNOSIS — R008 Other abnormalities of heart beat: Secondary | ICD-10-CM | POA: Insufficient documentation

## 2021-06-24 DIAGNOSIS — I472 Ventricular tachycardia, unspecified: Secondary | ICD-10-CM | POA: Diagnosis not present

## 2021-06-24 DIAGNOSIS — Z79899 Other long term (current) drug therapy: Secondary | ICD-10-CM | POA: Insufficient documentation

## 2021-06-24 DIAGNOSIS — I110 Hypertensive heart disease with heart failure: Secondary | ICD-10-CM | POA: Insufficient documentation

## 2021-06-24 LAB — ECHOCARDIOGRAM COMPLETE
Area-P 1/2: 3.08 cm2
Calc EF: 32 %
S' Lateral: 5.1 cm
Single Plane A2C EF: 36.5 %
Single Plane A4C EF: 28.9 %

## 2021-06-24 LAB — CUP PACEART REMOTE DEVICE CHECK
Date Time Interrogation Session: 20230131113243
Implantable Lead Implant Date: 20180312
Implantable Lead Location: 753860
Implantable Lead Model: 293
Implantable Lead Serial Number: 422842
Implantable Pulse Generator Implant Date: 20180312
Pulse Gen Serial Number: 226601

## 2021-06-24 LAB — COMPREHENSIVE METABOLIC PANEL
ALT: 10 U/L (ref 0–44)
AST: 14 U/L — ABNORMAL LOW (ref 15–41)
Albumin: 3.8 g/dL (ref 3.5–5.0)
Alkaline Phosphatase: 70 U/L (ref 38–126)
Anion gap: 7 (ref 5–15)
BUN: 9 mg/dL (ref 6–20)
CO2: 26 mmol/L (ref 22–32)
Calcium: 8.9 mg/dL (ref 8.9–10.3)
Chloride: 106 mmol/L (ref 98–111)
Creatinine, Ser: 0.83 mg/dL (ref 0.44–1.00)
GFR, Estimated: >60 mL/min (ref >60)
Glucose, Bld: 95 mg/dL (ref 70–99)
Potassium: 3.6 mmol/L (ref 3.5–5.1)
Sodium: 139 mmol/L (ref 135–145)
Total Bilirubin: 0.5 mg/dL (ref 0.3–1.2)
Total Protein: 7.2 g/dL (ref 6.5–8.1)

## 2021-06-24 LAB — TSH: TSH: 2.222 u[IU]/mL (ref 0.350–4.500)

## 2021-06-24 LAB — BRAIN NATRIURETIC PEPTIDE: B Natriuretic Peptide: 237.4 pg/mL — ABNORMAL HIGH (ref 0.0–100.0)

## 2021-06-24 LAB — MAGNESIUM: Magnesium: 2.1 mg/dL (ref 1.7–2.4)

## 2021-06-24 LAB — T4, FREE: Free T4: 0.94 ng/dL (ref 0.61–1.12)

## 2021-06-24 MED ORDER — MEXILETINE HCL 200 MG PO CAPS: 200.0000 mg | ORAL_CAPSULE | Freq: Two times a day (BID) | ORAL | 4 refills | Status: DC

## 2021-06-24 NOTE — Patient Instructions (Addendum)
Medication Changes:  START mexiletine 200 mg Twice daily   Lab Work:  Labs done today, your results will be available in MyChart, we will contact you for abnormal readings.   Testing/Procedures:  none  Referrals:  None   Special Instructions // Education:  none  Follow-Up in: 4 months  At the [facility], you and your health needs are our priority. We have a designated team specialized in the treatment of Heart Failure. This Care Team includes your primary Heart Failure Specialized Cardiologist (physician), Advanced Practice Providers (APPs- Physician Assistants and Nurse Practitioners), and Pharmacist who all work together to provide you with the care you need, when you need it.   You may see any of the following providers on your designated Care Team at your next follow up:  Dr Glori Bickers Dr Haynes Kerns, NP Lyda Jester, [place] [facility] [employer] [place], [place] Audry Riles, PharmD   Please be sure to bring in all your medications bottles to every appointment.   Need to Contact us:  If you have any questions or concerns before your next appointment please send [place] a message through [place] or call our office at [phone].    TO LEAVE A MESSAGE FOR THE NURSE SELECT OPTION 2, PLEASE LEAVE A MESSAGE INCLUDING: YOUR NAME DATE OF BIRTH CALL BACK NUMBER REASON FOR CALL**this is important as we prioritize the call backs  YOU WILL RECEIVE A CALL BACK THE SAME DAY AS LONG AS YOU CALL BEFORE 4:00 PM

## 2021-06-24 NOTE — Progress Notes (Signed)
Advanced Heart Failure Clinic Note   Referring Physician: PCP: Hoyt Koch, MD PCP-Cardiologist: Sinclair Grooms, MD  [facility]: Dr. Haroldine Laws   Reason for Visit: F/u for Chronic Systolic Heart Failure  HPI: [name] is a 46 y.o. 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   Admitted 7/19 with CP Symptoms thought to be related to frequent PVCs. EP consulted and scheduled her for PVC ablation. Echo repeated 11/22/17 and showed improved EF 50-55%.    Underwent PVC ablation 12/01/17. Was felt not to be successful.  Was on flecainide but discontinued due to reduced EF. Placed on amio   Echo 10/2018 showed drop in EF back down to 25-30%. Was instructed to f/u in clinic but pt did not. She had outpatient monitor 04/2018 that showed rare PVCs.    Admitted 6/21 with chest pain and shortness of breath. Viral panel negative. ECHO EF < 20% and normal RV. Had cath with normal cors and preserved cardiac output. Plan was to f/u with EP regarding PVCs. She has remained on AAD therapy w/ amiodarone + ? blocker therapy.  Admitted to [facility] 4/22 w/ increased dyspnea and volume overload. Also w/ increased PVC burden. Echo showed EF 20-25%, RV normal. She was diuresed w/ IV Lasix and placed on amiodarone gtt for PVC suppression. Seen by Dr. Lovena Le, due to the location of her PVCs, she is not a candidate for re-do ablation.   S/p Barostim 6/22.  Zio 8/22: 13% PVCs (unifocal)  Seen by EP 12/22 and Barostim titrated. Not felt to have any other options for PVCs.   Today she returns for HF follow up. Doing well. Recently doing tap dancing and walking in pool. SOB improved but still SOB if she hurries. No orthopnea or PND. Was having  migraines and CP and went to ER 05/31/21 w/u negative. BP drops low and she gets presyncopal.   Echo today 06/24/21 EF 25-30% RV normal Personally reviewed   [employer] Score: 1  Activity 1.1 h/d. No A fib or VT. No shocks. Personally reviewed     Cardiac Studies  - Cath (6/17): revealed normal vessels, moderate elevation in pulmonary artery pressures, and LVEF 25-30%.   - RHC (6/21):   RA = 4 RV = 32/6 PA = 31/8 (21) PCW = 12 Fick cardiac output/index = 7.0/3.4 Thermo CO/CI = 5.2/2.5 PVR =1.7 (Thermo) Ao sat = 98% PA sat = 67%, 69%   - Echo 6/15 35-40% - Echo 6/17 25-30% - Echo 12/17 20-25% - Echo 8/18 EF 25% - Echo 7/19: EF 50-55% - Echo 6/20: EF 25-30% - Echo 11/13/19: Ef < 20% RV normal  - Echo 4/22: EF 20-25%, RV normal    - CPX (2/18): FVC 2.57 (79%)      FEV1 2.14 (81%)        FEV1/FVC 83 (101%)        MVV 107 (102%) Resting HR: 106 Peak HR: 166   (93% age predicted max HR) BP rest: 136/98 BP peak: 174/88 Peak VO2: 17.1 (85% predicted peak VO2) - corrects to 28.4 for ibw VE/VCO2 slope:  33 OUES: 2.16 Peak RER: 1.09 Ventilatory Threshold: 14.6 (73% predicted or measured peak VO2) VE/MVV:  59% PETCO2 at peak:  33 O2pulse:  10   (83% predicted O2pulse)   - CPX (12/18): pVO2 14.0 (corrects to 23.0 for IBW) VeVCO2  32 RER 1.0  - PFTs w/ DLOC (4/22) Mild Restriction Normal Diffusion   Review of systems complete and found to be negative unless listed in HPI.    Past Medical History:  Diagnosis Date   Acute on chronic systolic (congestive) heart failure (HCC) 04/29/2017   Acute pain of right shoulder 06/16/2017   AICD (automatic cardioverter/defibrillator) present    Anginal pain (HCC)    Anxiety    Arthritis    right shoulder    Asthma    CHF (congestive heart failure) ([place])    Chronic combined systolic and diastolic heart failure ([place]) 03/05/2014   Closed low lateral malleolus fracture 10/23/2013   Cystitis 10/21/2017   Depression     Depression with anxiety 01/20/2013   Diabetes mellitus without complication (HCC)    Diverticulosis    Dyspnea    comes and goes intermittently mostly with exertion    Dysrhythmia    Essential hypertension    Prev followed by [name]    Fibroid    age 17   Gallstones    Generalized abdominal cramping    History of cardiomyopathy    Hypertension    IBS (irritable bowel syndrome)    ICD (implantable cardioverter-defibrillator), single, 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 CPAP intolerance >> "smothering" Good tolerance of auto device (optimal pressure 12-13 on download).  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     albuterol (PROAIR HFA) 108 (90 Base) MCG/ACT inhaler Inhale 1-2 puffs into the lungs every 6 (six) hours as needed for wheezing or shortness of breath. Follow-up appt is due must see provider for future refills 18 g 0   amiodarone (PACERONE) 200 MG tablet Take 1 tablet (200 mg total) by mouth daily. 90 tablet 3    budesonide-formoterol (SYMBICORT) 160-4.5 MCG/ACT inhaler Inhale 2 puffs into the lungs 2 (two) times daily as needed. Follow-up appt is due must see provider for future refills 10 g 0   diclofenac (VOLTAREN) 75 MG EC tablet Take 1 tablet by mouth twice daily 30 tablet 0   diclofenac Sodium (VOLTAREN) 1 % GEL Apply 4 g topically 4 (four) times daily. 300 g 6   dicyclomine (BENTYL) 20 MG tablet Take 1 tablet (20 mg total) by mouth daily as needed for spasms. 90 tablet 3   empagliflozin (JARDIANCE) 10 MG  TABS tablet Take 1 tablet (10 mg total) by mouth daily. 30 tablet 3   fluticasone (FLONASE) 50 MCG/ACT nasal spray Place 2 sprays into both nostrils daily as needed for allergies. Follow-up appt is due must see provider for future refills 16 g 0   gabapentin (NEURONTIN) 300 MG capsule Take 1 capsule (300 mg total) by mouth at bedtime as needed (back spasms). 90 capsule 3   ipratropium-albuterol (DUONEB) 0.5-2.5 (3) MG/3ML SOLN Take 3 mLs by nebulization every 6 (six) hours as needed. 360 mL 0   ivabradine (CORLANOR) 7.5 MG TABS tablet Take 1 tablet (7.5 mg total) by mouth 2 (two) times daily with a meal. 60 tablet 2   lidocaine (LIDODERM) 5 % Place 1 patch onto the skin daily. Remove & Discard patch within 12 hours or as directed by MD 90 patch 3   Magnesium Oxide 200 MG TABS Take 1 tablet (200 mg total) by mouth daily. 90 tablet 3   metoprolol succinate (TOPROL-XL) 50 MG 24 hr tablet Take 1 tablet (50 mg total) by mouth daily. Take with or immediately following a meal. 30 tablet 3   Multiple Vitamins-Minerals (MULTIVITAMIN GUMMIES WOMENS PO) Take 2 tablets by mouth daily.     nitroGLYCERIN (NITROSTAT) 0.4 MG SL tablet Place 1 tablet (0.4 mg total) under the tongue every 5 (five) minutes as needed for chest pain. 75 tablet 0   potassium chloride SA (KLOR-CON M) 20 MEQ tablet Take 20 mEq by mouth daily.     sacubitril-valsartan (ENTRESTO) 49-51 MG Take 1 tablet by mouth 2 (two) times daily. 60 tablet 6    spironolactone (ALDACTONE) 25 MG tablet Take 1 tablet (25 mg total) by mouth at bedtime. 90 tablet 3   tiZANidine (ZANAFLEX) 2 MG tablet TAKE 1 TABLET BY MOUTH AT BEDTIME 30 tablet 0   torsemide (DEMADEX) 20 MG tablet Take 20 mg by mouth daily.     Ubrogepant (UBRELVY) 100 MG TABS Take 100 mg by mouth daily as needed (migraine). 30 tablet 3   Vitamin D, Ergocalciferol, (DRISDOL) 1.25 MG (50000 UNIT) CAPS capsule Take 1 capsule (50,000 Units total) by mouth every 7 (seven) days. 12 capsule 0   Levonorgestrel-Ethinyl Estradiol (AMETHIA) 0.15-0.03 &0.01 MG tablet Take 1 tablet by mouth daily. 91 tablet 4   No current facility-administered medications for this encounter.   Allergies  Allergen Reactions   Vancomycin Other (See Comments)    "did something to my kidneys," PROGRESSED TO KIDNEY FAILURE!!   Aspirin Other (See Comments)    Wheezing, (Pt states that she just wheezes some when she takes aspirin by itself but she can take aspirin in a combination product).   Contrast Media [Iodinated Contrast Media] Other (See Comments)    Multiple CT contrast studies done over 2 weeks caused ARF   Cyclobenzaprine Other (See Comments)    Can not tolerate   Ciprofloxacin Itching and Rash   Farxiga [Dapagliflozin] Rash   Sulfa Antibiotics Itching and Rash   Social History   Socioeconomic History   Marital status: Married    Spouse name: Not on file   Number of children: 1   Years of education: Not on file   Highest education level: Not on file  Occupational History   Occupation: stay at home mom  Tobacco Use   Smoking status: Never   Smokeless tobacco: Never  Vaping Use   Vaping Use: Never used  Substance and Sexual Activity   Alcohol use: No   Drug use: No  Sexual activity: Yes  Other Topics Concern   Not on file  Social History Narrative   Not on file   Social Determinants of Health   Financial Resource Strain: Not on file  Food Insecurity: Not on file  Transportation Needs:  Not on file  Physical Activity: Not on file  Stress: Not on file  Social Connections: Not on file  Intimate Partner Violence: Not on file   Family History  Problem Relation Age of Onset   Emphysema Maternal Grandmother        smoked   Heart disease Maternal Grandmother 22       MI   Rheum arthritis Mother    Allergies Daughter    Colon cancer Neg Hx    BP 125/72    Pulse 95    Wt 98.4 kg (217 lb)    LMP 05/28/2021    SpO2 98%    BMI 36.11 kg/m   Wt Readings from Last 3 Encounters:  06/24/21 98.4 kg (217 lb)  06/07/21 95.3 kg (210 lb 1.6 oz)  05/31/21 95.3 kg (210 lb)   PHYSICAL EXAM: General:  Well appearing. No resp difficulty HEENT: normal Neck: supple. no JVD. Carotids 2+ bilat; no bruits. No lymphadenopathy or thryomegaly appreciated. Cor: PMI nondisplaced. Irregular rate & rhythm. No rubs, gallops or murmurs. Lungs: clear Abdomen: obese soft, nontender, nondistended. No hepatosplenomegaly. No bruits or masses. Good bowel sounds. Extremities: no cyanosis, clubbing, rash, edema Neuro: alert & orientedx3, cranial nerves grossly intact. moves all 4 extremities w/o difficulty. Affect pleasant  ECG: Sinus rhythm 92 with frequent unifocal PVCs with triplets. Personally reviewed   ASSESSMENT & PLAN:  1. Chronic systolic HF:  due to peri-partum CM, onset 1999. S/P [employer] ICD 07/2016 - LHC 6/17 No CAD - EF 20-25% (12/17).  - Echo 8/18 EF 25-30% s/p ICD 3/18. [place] 3/19: RA 2, PA 28/13, Fick CO 5.7, CI 2.8 - CPX 12/18 showed moderate limitation due mostly to obesity with some HF component - Echo 11/22/17 LVEF 50-55%, Grade 1 DD, Mild MR, Mod LAE - Echo 04/2018 EF 20-25%. RV ok  - Echo 10/2019 EF < 20%. - Echo 08/2020 EF 20-25%, RV normal  - Echo today 06/25/19 25-30% Personally reviewed - Has Barostim 10/2020   - NYHA Class II. Volume status ok HL Score 1 - Continue Entresto 49-51 mg twice a day.  - Continue torsemide 20 mg daily .  - Continue Jardiance 10 mg  -  continue spiro to 25 mg daily at bed time.  - Continue Toprol XL 50 mg daily    - Continue corlanor 7.5mg  BID for now.  - If no improvement post BaroStim placement, will consider repeat - BP too low to titrate further  2. Frequent PVCs/ Bigeminy - Holter Monitor 6/19 with 30% PVCs with one primary morphology.  - S/p PVC ablation 11/2017 with Dr Lovena Le unsuccessful - Off flecainide with EF down and persistent PVCs.  - On amio 200 daily - Zio Patch 04/2019 > 17% PVCs and >8% couplets.  - Zio Patch 11/28/2019 8.5 % PVCs - Zio 9/22 13% PVCs (unifocal)  - Per Dr. Lovena Le, due to the location of her PVCs, she is not a candidate for re-do ablation. Could consider referral to tertiary center, but success rate is not any more likely.  - Will try mexilitene 200 bid. If successful will try to wean amio. If not consider referral to Dr. Lenard Galloway to discuss ablation - check amio labs  3. Morbid  obesity - s/p Gastric Sleeve at [facility] 09/20/17  - Body mass index is 36.11 kg/m.    4. Daytime Fatigue - Sleep study in 2019 and again in 2021 were both negative for sleep apnea   5. DM2 - Now on Jardiance for concomitant HF   Glori Bickers, MD 06/24/21

## 2021-06-24 NOTE — Progress Notes (Signed)
°  Echocardiogram 2D Echocardiogram has been performed.  [name] 06/24/2021, 9:54 AM

## 2021-06-25 ENCOUNTER — Telehealth: Payer: Self-pay

## 2021-06-25 LAB — T3, FREE: T3, Free: 3.2 pg/mL (ref 2.0–4.4)

## 2021-06-25 NOTE — Progress Notes (Signed)
EPIC Encounter for ICM Monitoring  Patient Name: [name] is a 46 y.o. female Date: 06/25/2021 Primary Care Physican: Hoyt Koch, MD Primary Cardiologist: Bensimhon Electrophysiologist: Caryl Comes 06/24/2021 Office Weight: 217 lbs                                                   Attempted call to patient and unable to reach.  Left detailed message per DPR regarding transmission. Transmission reviewed.    Heartlogic HF Index 1 suggesting fluid levels have returned to normal.  Barostim implant 6/27.   Prescribed: Torsemide 20 mg Take 1 tablet  (20 mg total) by mouth daily.  Pt self adjusts Torsemide as needed. Potassium 20 mEq take 1 tablet daily Spironolactone 25 mg take 1 tablet (25 mg total) at bedtime.    Labs: 06/24/2021 Creatinine 0.83, BUN 9,   Potassium 3.6, Sodium 139 05/31/2021 Creatinine 1.02, BUN 7,   Potassium 3.5, Sodium 137  05/15/2021 Creatinine 0.82, BUN 11, Potassium 3.8, Sodium 140, GFR 89  A complete set of results can be found in Results Review.   Recommendations: Left voice mail with ICM number and encouraged to call if experiencing any fluid symptoms.  Advised to call tech support with number regarding disconnected home monitor    Follow-up plan: ICM clinic phone appointment on 07/28/2021.   91 day device clinic remote transmission 09/17/2021.              EP/Cardiology next office visit:   08/22/2021 with Oda Kilts, [place].  09/29/2021 with Dr Haroldine Laws.           Copy of ICM check sent to Dr. Caryl Comes.   3 Month Trend    8 Day Data Trend          [name], RN 06/25/2021 10:39 AM

## 2021-06-25 NOTE — Telephone Encounter (Signed)
Remote ICM transmission received.  Attempted call to patient regarding ICM remote transmission and left detailed message per DPR.  Advised to return call for any fluid symptoms or questions. Next ICM remote transmission scheduled 07/28/2021.

## 2021-06-27 ENCOUNTER — Encounter: Payer: Self-pay | Admitting: Hematology and Oncology

## 2021-06-30 ENCOUNTER — Telehealth: Payer: Self-pay | Admitting: Hematology and Oncology

## 2021-06-30 ENCOUNTER — Other Ambulatory Visit (HOSPITAL_COMMUNITY): Payer: Self-pay | Admitting: Internal Medicine

## 2021-06-30 NOTE — Progress Notes (Signed)
Remote ICD transmission.   

## 2021-06-30 NOTE — Telephone Encounter (Signed)
Scheduled appointment per patient message. Left message with appointment times.

## 2021-07-01 ENCOUNTER — Inpatient Hospital Stay: Payer: HMO

## 2021-07-01 ENCOUNTER — Inpatient Hospital Stay: Payer: HMO | Admitting: Hematology and Oncology

## 2021-07-09 ENCOUNTER — Inpatient Hospital Stay: Payer: HMO | Admitting: Hematology and Oncology

## 2021-07-09 ENCOUNTER — Inpatient Hospital Stay: Payer: HMO | Attending: Hematology and Oncology

## 2021-07-09 NOTE — Progress Notes (Unsigned)
[name] CONSULT NOTE  Patient Care Team: Hoyt Koch, MD as PCP - General (Internal Medicine) Bensimhon, Shaune Pascal, MD as PCP - Advanced Heart Failure (Cardiology) Belva Crome, MD as PCP - Cardiology (Cardiology) Evans Lance, MD as PCP - Electrophysiology (Cardiology) Tanda Rockers, MD (Pulmonary Disease) Chucky May, MD (Psychiatry) Lelon Perla, MD (Cardiology) Rigoberto Noel, MD (Pulmonary Disease) Charlton Haws, [facility] as Pharmacist (Pharmacist)  CHIEF COMPLAINTS/PURPOSE OF CONSULTATION:  IDA, follow up after iron infusion  ASSESSMENT & PLAN:   This is a very pleasant 46 year old female patient with iron deficiency anemia from chronic blood loss secondary to menorrhagia status post intravenous iron supplementation who is here for follow-up.     She was encouraged to reach out to [place] sooner with any new questions or concerns.  HISTORY OF PRESENTING ILLNESS:  [name] 46 y.o. female is here because of IDA  Interval History  [name] is here for follow up. She is doing well except for ongoing cardiac issues She has a barostimulator in place now to control her HTN. She feels tired and SOB but this could very well be from her heart issues. She still has heavy menstrual cycles. Rest of the pertinent 10 point ROS reviewed and negative.  MEDICAL HISTORY:  Past Medical History:  Diagnosis Date   Acute on chronic systolic (congestive) heart failure (HCC) 04/29/2017   Acute pain of right shoulder 06/16/2017   AICD (automatic cardioverter/defibrillator) present    Anginal pain (HCC)    Anxiety    Arthritis    right shoulder    Asthma    CHF (congestive heart failure) ([place])    Chronic combined systolic and diastolic heart failure ([place]) 03/05/2014   Closed low lateral malleolus fracture 10/23/2013   Cystitis 10/21/2017   Depression    Depression with anxiety 01/20/2013   Diabetes mellitus without complication (HCC)     Diverticulosis    Dyspnea    comes and goes intermittently mostly with exertion    Dysrhythmia    Essential hypertension    Prev followed by [name]    Fibroid    age 23   Gallstones    Generalized abdominal cramping    History of cardiomyopathy    Hypertension    IBS (irritable bowel syndrome)    ICD (implantable cardioverter-defibrillator), single, 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 CPAP intolerance >> "smothering" Good tolerance of auto device (optimal pressure 12-13 on download).  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   PVC (premature ventricular contraction) 06/23/2016   Seizures ([place])    "as a child" (08/03/2016)   Termination of pregnancy    due to cardiac risk    SURGICAL HISTORY: Past Surgical History:  Procedure Laterality Date   CARDIAC CATHETERIZATION N/A 11/11/2015  Procedure: Right/Left Heart Cath and Coronary Angiography;  Surgeon: Troy Sine, MD;  Location: [place] CV LAB;  Service: Cardiovascular;  Laterality: N/A;   CARDIAC CATHETERIZATION  ~ 2015   CARDIAC DEFIBRILLATOR PLACEMENT  08/03/2016   CESAREAN SECTION  1999   COLONOSCOPY WITH PROPOFOL N/A 04/21/2016   Procedure: COLONOSCOPY WITH PROPOFOL;  Surgeon: Jerene Bears, MD;  Location: [facility];  Service: Gastroenterology;  Laterality: N/A;    ESOPHAGOGASTRODUODENOSCOPY (EGD) WITH PROPOFOL N/A 04/21/2016   Procedure: ESOPHAGOGASTRODUODENOSCOPY (EGD) WITH PROPOFOL;  Surgeon: Jerene Bears, MD;  Location: [facility];  Service: Gastroenterology;  Laterality: N/A;   FOOT FRACTURE SURGERY Right ~ 2003   FRACTURE SURGERY     ICD IMPLANT N/A 08/03/2016   Procedure: ICD Implant;  Surgeon: Deboraha Sprang, MD;  Location: [place] [facility] LAB;  Service: Cardiovascular;  Laterality: N/A;   LAPAROSCOPIC CHOLECYSTECTOMY  12/2006   LAPAROSCOPIC GASTRIC SLEEVE RESECTION     LAPAROSCOPY ABDOMEN DIAGNOSTIC  2008   "cut bile duct w/gallbladder OR; had to go in later & fix leak; hospitalized for 2 months"   LEFT HEART CATHETERIZATION WITH CORONARY ANGIOGRAM N/A 02/26/2014   Procedure: LEFT HEART CATHETERIZATION WITH CORONARY ANGIOGRAM;  Surgeon: Jettie Booze, MD;  Location: [facility] CATH LAB;  Service: Cardiovascular;  Laterality: N/A;   OVARIAN CYST REMOVAL Right 1999   PVC ABLATION N/A 12/01/2017   Procedure: PVC ABLATION;  Surgeon: Evans Lance, MD;  Location: [place] [facility] LAB;  Service: Cardiovascular;  Laterality: N/A;   RIGHT HEART CATH N/A 08/05/2017   Procedure: RIGHT HEART CATH;  Surgeon: Jolaine Artist, MD;  Location: [place] [facility];  Service: Cardiovascular;  Laterality: N/A;   RIGHT HEART CATH N/A 11/16/2019   Procedure: RIGHT HEART CATH;  Surgeon: Jolaine Artist, MD;  Location: [place] [facility] LAB;  Service: Cardiovascular;  Laterality: N/A;   TUBAL LIGATION  1999    SOCIAL HISTORY: Social History   Socioeconomic History   Marital status: Married    Spouse name: Not on file   Number of children: 1   Years of education: Not on file   Highest education level: Not on file  Occupational History   Occupation: stay at home mom  Tobacco Use   Smoking status: Never   Smokeless tobacco: Never  Vaping Use   Vaping Use: Never used  Substance and Sexual Activity   Alcohol use: No   Drug use: No   Sexual activity: Yes   Other Topics Concern   Not on file  Social History Narrative   Not on file   Social Determinants of Health   Financial Resource Strain: Not on file  Food Insecurity: Not on file  Transportation Needs: Not on file  Physical Activity: Not on file  Stress: Not on file  Social Connections: Not on file  Intimate Partner Violence: Not on file    FAMILY HISTORY: Family History  Problem Relation Age of Onset   Emphysema Maternal Grandmother        smoked   Heart disease Maternal Grandmother 44       MI   Rheum arthritis Mother    Allergies Daughter    Colon cancer Neg Hx     ALLERGIES:  is allergic to vancomycin, aspirin, contrast media [iodinated contrast media], cyclobenzaprine, ciprofloxacin, farxiga [dapagliflozin], and sulfa antibiotics.  MEDICATIONS:  Current Outpatient Medications  Medication Sig Dispense Refill   acetaminophen (TYLENOL) 650 MG CR tablet Take 650 mg by mouth  every 8 (eight) hours as needed for pain (headache).     albuterol (PROAIR HFA) 108 (90 Base) MCG/ACT inhaler Inhale 1-2 puffs into the lungs every 6 (six) hours as needed for wheezing or shortness of breath. Follow-up appt is due must see provider for future refills 18 g 0   amiodarone (PACERONE) 200 MG tablet Take 1 tablet (200 mg total) by mouth daily. 90 tablet 3   budesonide-formoterol (SYMBICORT) 160-4.5 MCG/ACT inhaler Inhale 2 puffs into the lungs 2 (two) times daily as needed. Follow-up appt is due must see provider for future refills 10 g 0   CORLANOR 7.5 MG TABS tablet TAKE 1 TABLET BY MOUTH TWICE DAILY WITH A MEAL 60 tablet 0   diclofenac (VOLTAREN) 75 MG EC tablet Take 1 tablet by mouth twice daily 30 tablet 0   diclofenac Sodium (VOLTAREN) 1 % GEL Apply 4 g topically 4 (four) times daily. 300 g 6   dicyclomine (BENTYL) 20 MG tablet Take 1 tablet (20 mg total) by mouth daily as needed for spasms. 90 tablet 3   empagliflozin (JARDIANCE) 10 MG TABS tablet Take 1 tablet (10 mg total) by mouth  daily. 30 tablet 3   fluticasone (FLONASE) 50 MCG/ACT nasal spray Place 2 sprays into both nostrils daily as needed for allergies. Follow-up appt is due must see provider for future refills 16 g 0   gabapentin (NEURONTIN) 300 MG capsule Take 1 capsule (300 mg total) by mouth at bedtime as needed (back spasms). 90 capsule 3   ipratropium-albuterol (DUONEB) 0.5-2.5 (3) MG/3ML SOLN Take 3 mLs by nebulization every 6 (six) hours as needed. 360 mL 0   Levonorgestrel-Ethinyl Estradiol (AMETHIA) 0.15-0.03 &0.01 MG tablet Take 1 tablet by mouth daily. 91 tablet 4   lidocaine (LIDODERM) 5 % Place 1 patch onto the skin daily. Remove & Discard patch within 12 hours or as directed by MD 90 patch 3   Magnesium Oxide 200 MG TABS Take 1 tablet (200 mg total) by mouth daily. 90 tablet 3   metoprolol succinate (TOPROL-XL) 50 MG 24 hr tablet Take 1 tablet (50 mg total) by mouth daily. Take with or immediately following a meal. 30 tablet 3   mexiletine (MEXITIL) 200 MG capsule Take 1 capsule (200 mg total) by mouth 2 (two) times daily. 60 capsule 4   Multiple Vitamins-Minerals (MULTIVITAMIN GUMMIES WOMENS PO) Take 2 tablets by mouth daily.     nitroGLYCERIN (NITROSTAT) 0.4 MG SL tablet Place 1 tablet (0.4 mg total) under the tongue every 5 (five) minutes as needed for chest pain. 75 tablet 0   potassium chloride SA (KLOR-CON M) 20 MEQ tablet Take 20 mEq by mouth daily.     sacubitril-valsartan (ENTRESTO) 49-51 MG Take 1 tablet by mouth 2 (two) times daily. 60 tablet 6   spironolactone (ALDACTONE) 25 MG tablet Take 1 tablet (25 mg total) by mouth at bedtime. 90 tablet 3   tiZANidine (ZANAFLEX) 2 MG tablet TAKE 1 TABLET BY MOUTH AT BEDTIME 30 tablet 0   torsemide (DEMADEX) 20 MG tablet Take 20 mg by mouth daily.     Ubrogepant (UBRELVY) 100 MG TABS Take 100 mg by mouth daily as needed (migraine). 30 tablet 3   Vitamin D, Ergocalciferol, (DRISDOL) 1.25 MG (50000 UNIT) CAPS capsule Take 1 capsule (50,000 Units total) by  mouth every 7 (seven) days. 12 capsule 0   No current facility-administered medications for this visit.    PHYSICAL EXAMINATION: ECOG PERFORMANCE STATUS: 1 - Symptomatic but completely  ambulatory  There were no vitals filed for this visit.  There were no vitals filed for this visit.   GENERAL:alert, no distress and comfortable SKIN: skin color, texture, turgor are normal, no rashes or significant lesions EYES: normal sclera clear OROPHARYNX:no exudate, no erythema and lips, buccal mucosa, and tongue normal  NECK: supple, thyroid normal size, non-tender, without nodularity. No lymphadenopathy. LUNGS: clear to auscultation and percussion with normal breathing effort. HEART: regular rate & rhythm and no murmurs and no lower extremity edema ABDOMEN:abdomen soft, non-tender and normal bowel sounds Musculoskeletal:no cyanosis of digits and no clubbing  PSYCH: alert & oriented x 3 with fluent speech NEURO: no focal motor/sensory deficits  LABORATORY DATA:  I have reviewed the data as listed Lab Results  Component Value Date   WBC 6.9 05/31/2021   HGB 13.6 05/31/2021   HCT 42.6 05/31/2021   MCV 93.2 05/31/2021   PLT 217 05/31/2021     Chemistry      Component Value Date/Time   NA 139 06/24/2021 1128   NA 140 05/15/2021 1032   K 3.6 06/24/2021 1128   CL 106 06/24/2021 1128   CO2 26 06/24/2021 1128   BUN 9 06/24/2021 1128   BUN 11 05/15/2021 1032   CREATININE 0.83 06/24/2021 1128   CREATININE 0.90 03/26/2016 1536      Component Value Date/Time   CALCIUM 8.9 06/24/2021 1128   ALKPHOS 70 06/24/2021 1128   AST 14 (L) 06/24/2021 1128   ALT 10 06/24/2021 1128   BILITOT 0.5 06/24/2021 1128   BILITOT 0.3 05/15/2021 1032     Labs from today reviewed, CBC normal.  RADIOGRAPHIC STUDIES: I have personally reviewed the radiological images as listed and agreed with the findings in the report. ECHOCARDIOGRAM COMPLETE  Result Date: 06/24/2021    ECHOCARDIOGRAM REPORT   Patient  Name:   [name] Date of Exam: 06/24/2021 Medical Rec #:  [id number]      Height:       65.0 in Accession #:    [id number]     Weight:       210.1 lb Date of Birth:  15-Jan-1976      BSA:          2.020 m Patient Age:    77 years       BP:           155/107 mmHg Patient Gender: F              HR:           73 bpm. Exam Location:  Outpatient Procedure: 2D Echo, 3D Echo, Cardiac Doppler, Color Doppler and Strain Analysis Indications:    I50.40* Unspecified combined systolic (congestive) and diastolic                 (congestive) heart failure  History:        Patient has prior history of Echocardiogram examinations, most                 recent 10/03/2020. Cardiomyopathy and CHF, Abnormal ECG and                 Defibrillator, Arrythmias:VTACH; Signs/Symptoms:Chest Pain. Non                 ischemic cardiomyopathy.  Sonographer:    Roseanna Rainbow RDCS Referring Phys: [id number] DANIEL R BENSIMHON  Sonographer Comments: Global longitudinal strain was attempted. IMPRESSIONS  1. Left ventricular ejection fraction, by estimation, is 25 to  30%. The left ventricle has severely decreased function. The left ventricle has no regional wall motion abnormalities. The left ventricular internal cavity size was mildly dilated. There is mild concentric left ventricular hypertrophy. Left ventricular diastolic parameters are indeterminate.  2. Right ventricular systolic function is normal. The right ventricular size is normal. There is normal pulmonary artery systolic pressure.  3. Left atrial size was mild to moderately dilated.  4. Right atrial size was mildly dilated.  5. The mitral valve is normal in structure. Mild mitral valve regurgitation. No evidence of mitral stenosis.  6. The aortic valve is tricuspid. There is mild calcification of the aortic valve. Aortic valve regurgitation is not visualized. No aortic stenosis is present.  7. The inferior vena cava is normal in size with greater than 50% respiratory variability, suggesting right  atrial pressure of 3 mmHg. FINDINGS  Left Ventricle: Left ventricular ejection fraction, by estimation, is 25 to 30%. The left ventricle has severely decreased function. The left ventricle has no regional wall motion abnormalities. The left ventricular internal cavity size was mildly dilated. There is mild concentric left ventricular hypertrophy. Left ventricular diastolic parameters are indeterminate. Right Ventricle: The right ventricular size is normal. No increase in right ventricular wall thickness. Right ventricular systolic function is normal. There is normal pulmonary artery systolic pressure. The tricuspid regurgitant velocity is 1.76 m/s, and  with an assumed right atrial pressure of 3 mmHg, the estimated right ventricular systolic pressure is 71.6 mmHg. Left Atrium: Left atrial size was mild to moderately dilated. Right Atrium: Right atrial size was mildly dilated. Pericardium: There is no evidence of pericardial effusion. Mitral Valve: The mitral valve is normal in structure. Mild mitral valve regurgitation. No evidence of mitral valve stenosis. Tricuspid Valve: The tricuspid valve is normal in structure. Tricuspid valve regurgitation is mild . No evidence of tricuspid stenosis. Aortic Valve: The aortic valve is tricuspid. There is mild calcification of the aortic valve. Aortic valve regurgitation is not visualized. No aortic stenosis is present. Pulmonic Valve: The pulmonic valve was normal in structure. Pulmonic valve regurgitation is not visualized. No evidence of pulmonic stenosis. Aorta: The aortic root is normal in size and structure. Venous: The inferior vena cava is normal in size with greater than 50% respiratory variability, suggesting right atrial pressure of 3 mmHg. IAS/Shunts: No atrial level shunt detected by color flow Doppler. Additional Comments: A device lead is visualized.  LEFT VENTRICLE PLAX 2D LVIDd:         5.30 cm LVIDs:         5.10 cm LV PW:         1.10 cm LV IVS:        1.30  cm LVOT diam:     2.40 cm LV SV:         103 LV SV Index:   51 LVOT Area:     4.52 cm  LV Volumes (MOD) LV vol d, MOD A2C: 159.0 ml LV vol d, MOD A4C: 159.0 ml LV vol s, MOD A2C: 101.0 ml LV vol s, MOD A4C: 113.0 ml LV SV MOD A2C:     58.0 ml LV SV MOD A4C:     159.0 ml LV SV MOD BP:      51.2 ml RIGHT VENTRICLE             IVC RV S prime:     11.40 cm/s  IVC diam: 1.60 cm TAPSE (M-mode): 1.8 cm LEFT ATRIUM  Index        RIGHT ATRIUM           Index LA diam:      4.00 cm 1.98 cm/m   RA Area:     15.50 cm LA Vol (A2C): 87.9 ml 43.52 ml/m  RA Volume:   42.70 ml  21.14 ml/m LA Vol (A4C): 34.3 ml 16.98 ml/m  AORTIC VALVE LVOT Vmax:   125.00 cm/s LVOT Vmean:  90.700 cm/s LVOT VTI:    0.228 m  AORTA Ao Root diam: 3.60 cm Ao Asc diam:  3.50 cm MITRAL VALVE                TRICUSPID VALVE MV Area (PHT): 3.08 cm     TR Peak grad:   12.4 mmHg MV Decel Time: 247 msec     TR Vmax:        176.00 cm/s MV E velocity: 104.00 cm/s                             SHUNTS                             Systemic VTI:  0.23 m                             Systemic Diam: 2.40 cm Glori Bickers MD Electronically signed by Glori Bickers MD Signature Date/Time: 06/24/2021/10:31:58 AM    Final    CUP PACEART REMOTE DEVICE CHECK  Result Date: 06/24/2021 Scheduled remote reviewed. Normal device function.  1 NSVT, no therapy Next remote 91 days. LA    All questions were answered. The patient knows to call the clinic with any problems, questions or concerns.  I spent 20 minutes in the care of this patient including H and P, review of records, counseling and coordination of care.    Benay Pike, MD 07/09/2021 12:49 PM

## 2021-07-10 ENCOUNTER — Other Ambulatory Visit: Payer: Self-pay

## 2021-07-10 DIAGNOSIS — D500 Iron deficiency anemia secondary to blood loss (chronic): Secondary | ICD-10-CM

## 2021-07-17 ENCOUNTER — Encounter: Payer: Self-pay | Admitting: Internal Medicine

## 2021-07-18 ENCOUNTER — Ambulatory Visit: Payer: HMO | Admitting: Internal Medicine

## 2021-07-22 ENCOUNTER — Encounter (HOSPITAL_COMMUNITY): Payer: Self-pay | Admitting: Internal Medicine

## 2021-07-28 ENCOUNTER — Ambulatory Visit (INDEPENDENT_AMBULATORY_CARE_PROVIDER_SITE_OTHER): Payer: HMO

## 2021-07-28 DIAGNOSIS — Z95810 Presence of automatic (implantable) cardiac defibrillator: Secondary | ICD-10-CM | POA: Diagnosis not present

## 2021-07-28 DIAGNOSIS — I5042 Chronic combined systolic (congestive) and diastolic (congestive) heart failure: Secondary | ICD-10-CM

## 2021-08-01 ENCOUNTER — Telehealth: Payer: Self-pay

## 2021-08-01 NOTE — Progress Notes (Signed)
EPIC Encounter for ICM Monitoring ? ?Patient Name: [name] is a 46 y.o. female ?Date: 08/01/2021 ?Primary Care Physican: Hoyt Koch, MD ?Primary Cardiologist: Bensimhon ?Electrophysiologist: Caryl Comes ?06/24/2021 Office Weight: 217 lbs                                                 ?  ?Attempted call to patient and unable to reach.  Left detailed message per DPR regarding transmission. Transmission reviewed.  ?  ?Heartlogic HF Index 12 suggesting normal fluid levels.  Barostim implant 6/27. ?  ?Prescribed: ?Torsemide 20 mg Take 1 tablet  (20 mg total) by mouth daily.  Pt self adjusts Torsemide as needed. ?Potassium 20 mEq take 1 tablet daily ?Spironolactone 25 mg take 1 tablet (25 mg total) at bedtime.  ?  ?Labs: ?06/24/2021 Creatinine 0.83, BUN 9,   Potassium 3.6, Sodium 139 ?05/31/2021 Creatinine 1.02, BUN 7,   Potassium 3.5, Sodium 137  ?05/15/2021 Creatinine 0.82, BUN 11, Potassium 3.8, Sodium 140, GFR 89  ?A complete set of results can be found in Results Review. ?  ?Recommendations: Left voice mail with ICM number and encouraged to call if experiencing any fluid symptoms. ?  ?Follow-up plan: ICM clinic phone appointment on 09/01/2021.   91 day device clinic remote transmission 09/17/2021.      ?  ?      EP/Cardiology next office visit:   08/22/2021 with Oda Kilts, [place].  09/29/2021 with Dr Haroldine Laws.   ?  ?      Copy of ICM check sent to Dr. Caryl Comes.  ? ?3 Month Trend ? ? ? ?8 Day Data Trend ?       ? ? ?[name], RN ?08/01/2021 ?2:21 PM ?

## 2021-08-01 NOTE — Telephone Encounter (Signed)
Remote ICM transmission received.  Attempted call to patient regarding ICM remote transmission and left detailed message per DPR.  Advised to return call for any fluid symptoms or questions. Next ICM remote transmission scheduled 09/01/2021.   ? ?

## 2021-08-12 DIAGNOSIS — I5040 Unspecified combined systolic (congestive) and diastolic (congestive) heart failure: Secondary | ICD-10-CM | POA: Diagnosis not present

## 2021-08-12 DIAGNOSIS — Z6833 Body mass index (BMI) 33.0-33.9, adult: Secondary | ICD-10-CM | POA: Diagnosis not present

## 2021-08-13 ENCOUNTER — Other Ambulatory Visit: Payer: Self-pay | Admitting: Internal Medicine

## 2021-08-13 ENCOUNTER — Other Ambulatory Visit: Payer: Self-pay | Admitting: Family Medicine

## 2021-08-20 ENCOUNTER — Telehealth: Payer: Self-pay

## 2021-08-20 NOTE — Telephone Encounter (Signed)
Remote transmission reviewed. Normal device function. 1 NSVT event noted back in november 2022. Patient called and updated. Recommended call PCP to discuss further. ED precautions given with verbal understanding.  ?

## 2021-08-20 NOTE — Telephone Encounter (Signed)
Received call from patient.  She sent remote transmission last night around 11:00 PM after experiencing a new sharp intense pain, lasting about 1 minute, that seem to come from her device then pain radiated through should down left bicep. The pain was so intense it took her breath away and thought the device may have shocked her.  After the pain subsided she felt fine and went to bed and has not experienced any further episodes.  She is feeling more tired than usual. ? ? ?Reviewed remote transmission and advised no shock but report suggesting she has possible fluid accumulation since 3/6.  Heartlogic HF index 25.   She will self adjust Torsemide for fluid accumulation and will recheck fluid levels 08/25/2021.  Advised if she experiences any further chest pain episodes to use local ER for evaluation.   ? ? ? ? ?Routed to device clinic triage for review of 3/28 report that was sent at  23:40 last night.   ?

## 2021-08-22 ENCOUNTER — Encounter: Payer: HMO | Admitting: Student

## 2021-08-25 ENCOUNTER — Ambulatory Visit (INDEPENDENT_AMBULATORY_CARE_PROVIDER_SITE_OTHER): Payer: HMO

## 2021-08-25 DIAGNOSIS — Z95810 Presence of automatic (implantable) cardiac defibrillator: Secondary | ICD-10-CM

## 2021-08-25 DIAGNOSIS — I5042 Chronic combined systolic (congestive) and diastolic (congestive) heart failure: Secondary | ICD-10-CM

## 2021-08-26 ENCOUNTER — Telehealth: Payer: Self-pay

## 2021-08-26 NOTE — Progress Notes (Signed)
EPIC Encounter for ICM Monitoring ? ?Patient Name: [name] is a 46 y.o. female ?Date: 08/26/2021 ?Primary Care Physican: Hoyt Koch, MD ?Primary Cardiologist: Bensimhon ?Electrophysiologist: Caryl Comes ?06/24/2021 Office Weight: 217 lbs                                                 ?  ?Attempted call to patient and unable to reach.  Left detailed message per DPR regarding transmission. Transmission reviewed.  Spoke with patient on 3/29 regarding fluid accumulation and her plan was to take extra Torsemide.  ?  ?Heartlogic HF Index 30 suggesting possible fluid accumulation starting 07/28/2021.  Barostim implant 6/27. ?  ?Prescribed: ?Torsemide 20 mg Take 1 tablet  (20 mg total) by mouth daily.  Pt self adjusts Torsemide as needed. ?Potassium 20 mEq take 1 tablet daily ?Spironolactone 25 mg take 1 tablet (25 mg total) at bedtime.  ?  ?Labs: ?06/24/2021 Creatinine 0.83, BUN 9,   Potassium 3.6, Sodium 139 ?05/31/2021 Creatinine 1.02, BUN 7,   Potassium 3.5, Sodium 137  ?05/15/2021 Creatinine 0.82, BUN 11, Potassium 3.8, Sodium 140, GFR 89  ?A complete set of results can be found in Results Review. ?  ?Recommendations: Left voice mail with ICM number and encouraged to call if experiencing any fluid symptoms. ?  ?Follow-up plan: ICM clinic phone appointment on 09/02/2021 for 31 day and recheck.  91 day device clinic remote transmission 09/17/2021.      ?  ?      EP/Cardiology next office visit:   09/17/2021 with Oda Kilts, [place].  09/29/2021 with Dr Haroldine Laws.   ?  ?      Copy of ICM check sent to Dr. Caryl Comes.  Will send to Dr Haroldine Laws for review if patient is reached.   ? ?      3 Month Trend ?       ?       ? ?       8 Day Data Trend ? ? ? ?[name], RN ?08/26/2021 ?4:13 PM ?

## 2021-08-26 NOTE — Telephone Encounter (Signed)
Remote ICM transmission received.  Attempted call to patient regarding ICM remote transmission and left detailed message per DPR.  Advised to return call for any fluid symptoms or questions. Next ICM remote transmission scheduled 09/02/2021.   ? ?

## 2021-09-02 ENCOUNTER — Ambulatory Visit (INDEPENDENT_AMBULATORY_CARE_PROVIDER_SITE_OTHER): Payer: HMO

## 2021-09-02 DIAGNOSIS — I5042 Chronic combined systolic (congestive) and diastolic (congestive) heart failure: Secondary | ICD-10-CM

## 2021-09-02 DIAGNOSIS — Z95810 Presence of automatic (implantable) cardiac defibrillator: Secondary | ICD-10-CM | POA: Diagnosis not present

## 2021-09-03 ENCOUNTER — Telehealth: Payer: Self-pay

## 2021-09-03 NOTE — Telephone Encounter (Signed)
Remote ICM transmission received.  Attempted call to patient regarding ICM remote transmission and left detailed message per DPR.  Advised to return call for any fluid symptoms or questions. Next ICM remote transmission scheduled 10/06/2021.   ? ?

## 2021-09-03 NOTE — Progress Notes (Signed)
EPIC Encounter for ICM Monitoring ? ?Patient Name: [name] is a 46 y.o. female ?Date: 09/03/2021 ?Primary Care Physican: Hoyt Koch, MD ?Primary Cardiologist: Bensimhon ?Electrophysiologist: Caryl Comes ?06/24/2021 Office Weight: 217 lbs                                                 ?  ?Attempted call to patient and unable to reach.  Left detailed message per DPR regarding transmission. Transmission reviewed.   ?  ?Heartlogic HF Index 15 suggesting possible fluid levels returned to normal.  Barostim implant 6/27. ?  ?Prescribed: ?Torsemide 20 mg Take 1 tablet  (20 mg total) by mouth daily.  Pt self adjusts Torsemide as needed. ?Potassium 20 mEq take 1 tablet daily ?Spironolactone 25 mg take 1 tablet (25 mg total) at bedtime.  ?  ?Labs: ?06/24/2021 Creatinine 0.83, BUN 9,   Potassium 3.6, Sodium 139 ?05/31/2021 Creatinine 1.02, BUN 7,   Potassium 3.5, Sodium 137  ?05/15/2021 Creatinine 0.82, BUN 11, Potassium 3.8, Sodium 140, GFR 89  ?A complete set of results can be found in Results Review. ?  ?Recommendations: Left voice mail with ICM number and encouraged to call if experiencing any fluid symptoms. ?  ?Follow-up plan: ICM clinic phone appointment on 10/06/2021.  91 day device clinic remote transmission 09/17/2021.      ?  ?      EP/Cardiology next office visit:   09/17/2021 with Oda Kilts, [place].  09/29/2021 with Dr Haroldine Laws.   ?  ?      Copy of ICM check sent to Dr. Caryl Comes.  ? ?3 month trend: 09/01/2021. ? ? ? ?8 day trend:  ? ? ? ?[name], RN ?09/03/2021 ?8:47 AM ? ?

## 2021-09-10 NOTE — Progress Notes (Signed)
? ? ?Electrophysiology Office Note ?Date: 09/17/2021 ? ?ID:  [name], DOB Jul 11, 1975, MRN [id number] ? ?PCP: Megan Koch, MD ?Primary Cardiologist: Megan Grooms, MD ?Electrophysiologist: Megan Peru, MD  ? ?CC: Pacemaker follow-up ? ?[name] is a 46 y.o. female seen today for Megan Peru, MD for routine electrophysiology followup.  ? ?At last visit device titrated up to 8.2 @ 65 ms without stim.   ? ?She reports doing very well now. 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 She also reports taking shopping trips to the mall, which would've been out of the question 6-8 months ago.   She is much more pleased with her progress. ? ?Device History: ?Barostim (standard) implanted 11/08/20 for Chronic systolic CHF ?Medtronic Single Chamber ICD implanted 2018 for NICM ? ?Past Medical History:  ?Diagnosis Date  ? Acute on chronic systolic (congestive) heart failure ([place]) 04/29/2017  ? Acute pain of right shoulder 06/16/2017  ? AICD (automatic cardioverter/defibrillator) present   ? Anginal pain ([place])   ? Anxiety   ? Arthritis   ? right shoulder   ? Asthma   ? CHF (congestive heart failure) ([place])   ? Chronic combined systolic and diastolic heart failure ([place]) 03/05/2014  ? Closed low lateral malleolus fracture 10/23/2013  ? Cystitis 10/21/2017  ? Depression   ? Depression with anxiety 01/20/2013  ? Diabetes mellitus without complication ([place])   ? Diverticulosis   ? Dyspnea   ? comes and goes intermittently mostly with exertion   ? Dysrhythmia   ? Essential hypertension   ? Prev followed by [facility]/ Cardiology   ? Fibroid   ? age 33  ? Gallstones   ? Generalized abdominal cramping   ? History of cardiomyopathy   ? Hypertension   ? IBS (irritable bowel syndrome)   ? ICD (implantable cardioverter-defibrillator), single, 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  ? Insomnia 05/07/2017  ? Labral tear of shoulder 04/04/2015  ?  Injected 04/04/2015 Injected 12/03/2015   ?  Migraine   ? "monthly" (08/03/2016)  ? Myofascial pain 06/16/2017  ? NICM (nonischemic cardiomyopathy) ([place]) 08/03/2016  ? Nonallopathic lesion of lumbosacral region 11/16/2016  ? Nonallopathic lesion of sacral region 11/16/2016  ? Nonallopathic lesion of thoracic region 08/20/2014  ? Nonspecific chest pain 04/28/2017  ? OSA (obstructive sleep apnea) 01/02/2013  ? NPSG 2009:  AHI 9/hr. CPAP intolerance >> "smothering" Good tolerance of auto device (optimal pressure 12-13 on download).  - referred to Dr Gwenette Greet    ? OSA on CPAP   ? Ovarian cyst   ? 1999; surgically removed  ? Patellofemoral syndrome of both knees 10/16/2016  ? 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 PVC (premature ventricular contraction) 06/23/2016  ? Seizures ([place])   ? "as a child" (08/03/2016)  ? Termination of pregnancy   ? due to cardiac risk  ? ?Past Surgical History:  ?Procedure Laterality Date  ? CARDIAC CATHETERIZATION N/A 11/11/2015  ? Procedure: Right/Left Heart Cath and Coronary Angiography;  Surgeon: Troy Sine, MD;  Location: [place] [facility];  Service: Cardiovascular;  Laterality: N/A;  ? CARDIAC CATHETERIZATION  ~ 2015  ? CARDIAC DEFIBRILLATOR PLACEMENT  08/03/2016  ? [place]  ? COLONOSCOPY WITH PROPOFOL N/A 04/21/2016  ? Procedure: COLONOSCOPY WITH PROPOFOL;  Surgeon: Jerene Bears, MD;  Location: [facility];  Service: Gastroenterology;  Laterality: N/A;  ? ESOPHAGOGASTRODUODENOSCOPY (EGD)  WITH PROPOFOL N/A 04/21/2016  ? Procedure: ESOPHAGOGASTRODUODENOSCOPY (EGD) WITH PROPOFOL;  Surgeon: Jerene Bears, MD;  Location: [facility];  Service: Gastroenterology;  Laterality: N/A;  ? FOOT FRACTURE SURGERY Right ~ 2003  ? FRACTURE SURGERY    ? ICD IMPLANT N/A 08/03/2016  ? Procedure: ICD Implant;  Surgeon: Deboraha Sprang, MD;  Location: [place] [facility];  Service: Cardiovascular;  Laterality: N/A;  ? LAPAROSCOPIC CHOLECYSTECTOMY  12/2006  ? LAPAROSCOPIC GASTRIC SLEEVE RESECTION    ? LAPAROSCOPY  ABDOMEN DIAGNOSTIC  2008  ? "cut bile duct w/gallbladder OR; had to go in later & fix leak; hospitalized for 2 months"  ? LEFT HEART CATHETERIZATION WITH CORONARY ANGIOGRAM N/A 02/26/2014  ? Procedure: LEFT HEART CATHETERIZATION WITH CORONARY ANGIOGRAM;  Surgeon: Jettie Booze, MD;  Location: [facility] C[facility] LAB;  Service: Cardiovascular;  Laterality: N/A;  ? OVARIAN CYST REMOVAL Right 1999  ? PVC ABLATION N/A 12/01/2017  ? Procedure: PVC ABLATION;  Surgeon: Evans Lance, MD;  Location: [place] [facility];  Service: Cardiovascular;  Laterality: N/A;  ? RIGHT HEART CATH N/A 08/05/2017  ? Procedure: RIGHT HEART CATH;  Surgeon: Jolaine Artist, MD;  Location: [place] [facility];  Service: Cardiovascular;  Laterality: N/A;  ? RIGHT HEART CATH N/A 11/16/2019  ? Procedure: RIGHT HEART CATH;  Surgeon: Jolaine Artist, MD;  Location: [place] [facility];  Service: Cardiovascular;  Laterality: N/A;  ? TUBAL LIGATION  1999  ? ? ?Current Outpatient Medications  ?Medication Sig Dispense Refill  ? acetaminophen (TYLENOL) 650 MG CR tablet Take 650 mg by mouth every 8 (eight) hours as needed for pain (headache).    ? albuterol (PROAIR HFA) 108 (90 Base) MCG/ACT inhaler Inhale 1-2 puffs into the lungs every 6 (six) hours as needed for wheezing or shortness of breath. Follow-up appt is due must see provider for future refills 18 g 0  ? amiodarone (PACERONE) 200 MG tablet Take 1 tablet (200 mg total) by mouth daily. 90 tablet 3  ? budesonide-formoterol (SYMBICORT) 160-4.5 MCG/ACT inhaler Inhale 2 puffs into the lungs 2 (two) times daily as needed. Follow-up appt is due must see provider for future refills 10 g 0  ? CORLANOR 7.5 MG TABS tablet TAKE 1 TABLET BY MOUTH TWICE DAILY WITH A MEAL 60 tablet 0  ? diclofenac (VOLTAREN) 75 MG EC tablet Take 1 tablet by mouth twice daily 30 tablet 0  ? diclofenac Sodium (VOLTAREN) 1 % GEL Apply 4 g topically 4 (four) times daily. 300 g 6  ? dicyclomine (BENTYL) 20 MG tablet Take 1 tablet (20  mg total) by mouth daily as needed for spasms. 90 tablet 3  ? empagliflozin (JARDIANCE) 10 MG TABS tablet Take 1 tablet (10 mg total) by mouth daily. 30 tablet 3  ? fluticasone (FLONASE) 50 MCG/ACT nasal spray Place 2 sprays into both nostrils daily as needed for allergies. Follow-up appt is due must see provider for future refills 16 g 0  ? gabapentin (NEURONTIN) 300 MG capsule Take 1 capsule (300 mg total) by mouth at bedtime as needed (back spasms). 90 capsule 3  ? ipratropium-albuterol (DUONEB) 0.5-2.5 (3) MG/3ML SOLN Take 3 mLs by nebulization every 6 (six) hours as needed. 360 mL 0  ? lidocaine (LIDODERM) 5 % Place 1 patch onto the skin daily. Remove & Discard patch within 12 hours or as directed by MD 90 patch 3  ? Magnesium Oxide 200 MG TABS Take 1 tablet (200 mg total) by mouth  daily. 90 tablet 3  ? metoprolol succinate (TOPROL-XL) 50 MG 24 hr tablet Take 1 tablet (50 mg total) by mouth daily. Take with or immediately following a meal. 30 tablet 3  ? mexiletine (MEXITIL) 200 MG capsule Take 1 capsule (200 mg total) by mouth 2 (two) times daily. 60 capsule 4  ? mirtazapine (REMERON) 15 MG tablet Take 1 tablet (15 mg total) by mouth at bedtime. 30 tablet 2  ? Multiple Vitamins-Minerals (MULTIVITAMIN GUMMIES WOMENS PO) Take 2 tablets by mouth daily.    ? nitroGLYCERIN (NITROSTAT) 0.4 MG SL tablet Place 1 tablet (0.4 mg total) under the tongue every 5 (five) minutes as needed for chest pain. 75 tablet 0  ? potassium chloride SA (KLOR-CON M) 20 MEQ tablet Take 20 mEq by mouth daily.    ? sacubitril-valsartan (ENTRESTO) 49-51 MG Take 1 tablet by mouth 2 (two) times daily. 60 tablet 6  ? spironolactone (ALDACTONE) 25 MG tablet Take 1 tablet (25 mg total) by mouth at bedtime. 90 tablet 3  ? tiZANidine (ZANAFLEX) 2 MG tablet TAKE 1 TABLET BY MOUTH AT BEDTIME 30 tablet 0  ? torsemide (DEMADEX) 20 MG tablet Take 20 mg by mouth daily.    ? Ubrogepant (UBRELVY) 100 MG TABS Take 100 mg by mouth daily as needed  (migraine). 30 tablet 3  ? Vitamin D, Ergocalciferol, (DRISDOL) 1.25 MG (50000 UNIT) CAPS capsule Take 1 capsule (50,000 Units total) by mouth every 7 (seven) days. 12 capsule 0  ? ?No current facility-administered m

## 2021-09-11 ENCOUNTER — Ambulatory Visit (INDEPENDENT_AMBULATORY_CARE_PROVIDER_SITE_OTHER): Payer: HMO | Admitting: Internal Medicine

## 2021-09-11 ENCOUNTER — Encounter: Payer: Self-pay | Admitting: Internal Medicine

## 2021-09-11 VITALS — BP 124/70 | HR 94 | Resp 18 | Ht 65.0 in | Wt 204.8 lb

## 2021-09-11 DIAGNOSIS — F331 Major depressive disorder, recurrent, moderate: Secondary | ICD-10-CM | POA: Diagnosis not present

## 2021-09-11 DIAGNOSIS — F418 Other specified anxiety disorders: Secondary | ICD-10-CM | POA: Diagnosis not present

## 2021-09-11 DIAGNOSIS — F5101 Primary insomnia: Secondary | ICD-10-CM

## 2021-09-11 MED ORDER — MIRTAZAPINE 15 MG PO TABS: 15.0000 mg | ORAL_TABLET | Freq: Every day | ORAL | 2 refills | Status: DC

## 2021-09-11 NOTE — Progress Notes (Signed)
? ?  Subjective:  ? ?Patient ID: [name], female    DOB: 23-Mar-1976, 46 y.o.   MRN: [id number] ? ?HPI ?The patient is a 46 YO female coming in for concerns. ? ?Review of Systems  ?Constitutional:  Positive for activity change and appetite change.  ?HENT: Negative.    ?Eyes: Negative.   ?Respiratory:  Positive for shortness of breath. Negative for cough and chest tightness.   ?Cardiovascular:  Negative for chest pain, palpitations and leg swelling.  ?Gastrointestinal:  Negative for abdominal distention, abdominal pain, constipation, diarrhea, nausea and vomiting.  ?Musculoskeletal: Negative.   ?Skin: Negative.   ?Neurological: Negative.   ?Psychiatric/Behavioral:  Positive for decreased concentration, dysphoric mood and sleep disturbance. The patient is nervous/anxious.   ? ?Objective:  ?Physical Exam ?Constitutional:   ?   Appearance: She is well-developed.  ?HENT:  ?   Head: Normocephalic and atraumatic.  ?Cardiovascular:  ?   Rate and Rhythm: Normal rate and regular rhythm.  ?Pulmonary:  ?   Effort: Pulmonary effort is normal. No respiratory distress.  ?   Breath sounds: Normal breath sounds. No wheezing or rales.  ?Abdominal:  ?   General: Bowel sounds are normal. There is no distension.  ?   Palpations: Abdomen is soft.  ?   Tenderness: There is no abdominal tenderness. There is no rebound.  ?Musculoskeletal:  ?   Cervical back: Normal range of motion.  ?Skin: ?   General: Skin is warm and dry.  ?Neurological:  ?   Mental Status: She is alert and oriented to person, place, and time.  ?   Coordination: Coordination normal.  ? ? ?Vitals:  ? 09/11/21 1104  ?BP: 124/70  ?Pulse: 94  ?Resp: 18  ?SpO2: 99%  ?Weight: 204 lb 12.8 oz (92.9 kg)  ?Height: '5\' 5"'$  (1.651 m)  ? ? ?This visit occurred during the SARS-CoV-2 public health emergency.  Safety protocols were in place, including screening questions prior to the visit, additional usage of staff PPE, and extensive cleaning of exam room while observing appropriate  contact time as indicated for disinfecting solutions.  ? ?Assessment & Plan:  ?Visit time 35 minutes in face to face communication with patient and coordination of care, additional 7 minutes spent in record review, coordination or care, ordering tests, communicating/referring to other healthcare professionals, documenting in medical records all on the same day of the visit for total time 42 minutes spent on the visit.  ? ?

## 2021-09-11 NOTE — Patient Instructions (Signed)
We have sent in remeron 15 mg to take at night time to help with sleep and the depression/anxiety. ? ?We will get you in for counseling. ? ? ?

## 2021-09-12 ENCOUNTER — Encounter: Payer: Self-pay | Admitting: Internal Medicine

## 2021-09-12 NOTE — Assessment & Plan Note (Signed)
Moderate symptoms which are prominent now. Some passive SI without plan. No guns in the home. We will start mirtazapine to see if this can help with sleep and mood. She is struggling a lot due to health problems, life transitions, lack of support. Referral for counseling as well. Follow up in 3-4 weeks with [place]. ?

## 2021-09-12 NOTE — Assessment & Plan Note (Signed)
Worse with decline in mood. We will start remeron 15 mg qhs to see if this can help with mood and sleep.  ?

## 2021-09-17 ENCOUNTER — Ambulatory Visit (INDEPENDENT_AMBULATORY_CARE_PROVIDER_SITE_OTHER): Payer: HMO

## 2021-09-17 ENCOUNTER — Ambulatory Visit (INDEPENDENT_AMBULATORY_CARE_PROVIDER_SITE_OTHER): Payer: HMO | Admitting: Student

## 2021-09-17 ENCOUNTER — Encounter: Payer: Self-pay | Admitting: Student

## 2021-09-17 VITALS — BP 124/80 | HR 68 | Ht 65.0 in | Wt 214.8 lb

## 2021-09-17 DIAGNOSIS — Z95810 Presence of automatic (implantable) cardiac defibrillator: Secondary | ICD-10-CM | POA: Diagnosis not present

## 2021-09-17 DIAGNOSIS — I428 Other cardiomyopathies: Secondary | ICD-10-CM

## 2021-09-17 DIAGNOSIS — I493 Ventricular premature depolarization: Secondary | ICD-10-CM

## 2021-09-17 DIAGNOSIS — I5042 Chronic combined systolic (congestive) and diastolic (congestive) heart failure: Secondary | ICD-10-CM

## 2021-09-17 LAB — CUP PACEART INCLINIC DEVICE CHECK
Date Time Interrogation Session: 20230426121437
HighPow Impedance: 62 Ohm
Implantable Lead Implant Date: 20180312
Implantable Lead Location: 753860
Implantable Lead Model: 293
Implantable Lead Serial Number: 422842
Implantable Pulse Generator Implant Date: 20180312
Lead Channel Impedance Value: 570 Ohm
Lead Channel Pacing Threshold Amplitude: 1.6 V
Lead Channel Pacing Threshold Pulse Width: 0.8 ms
Lead Channel Sensing Intrinsic Amplitude: 11 mV
Lead Channel Setting Pacing Amplitude: 2.5 V
Lead Channel Setting Pacing Pulse Width: 0.4 ms
Lead Channel Setting Sensing Sensitivity: 0.5 mV
Pulse Gen Serial Number: 226601

## 2021-09-17 NOTE — Patient Instructions (Signed)
Medication Instructions:  ?Your physician recommends that you continue on your current medications as directed. Please refer to the Current Medication list given to you today. ? ?*If you need a refill on your cardiac medications before your next appointment, please call your pharmacy* ? ? ?Lab Work: ?None ?If you have labs (blood work) drawn today and your tests are completely normal, you will receive your results only by: ?MyChart Message (if you have MyChart) OR ?A paper copy in the mail ?If you have any lab test that is abnormal or we need to change your treatment, we will call you to review the results. ? ? ?Follow-Up: ?At [facility], you and your health needs are our priority.  As part of our continuing mission to provide you with exceptional heart care, we have created designated Provider Care Teams.  These Care Teams include your primary Cardiologist (physician) and Advanced Practice Providers (APPs -  Physician Assistants and Nurse Practitioners) who all work together to provide you with the care you need, when you need it. ? ?Your next appointment:   ?12/22/2021 ?

## 2021-09-18 LAB — CUP PACEART REMOTE DEVICE CHECK
Battery Remaining Longevity: 120 mo
Battery Remaining Percentage: 86 %
Brady Statistic RV Percent Paced: 0 %
Date Time Interrogation Session: 20230426050300
HighPow Impedance: 66 Ohm
Implantable Lead Implant Date: 20180312
Implantable Lead Location: 753860
Implantable Lead Model: 293
Implantable Lead Serial Number: 422842
Implantable Pulse Generator Implant Date: 20180312
Lead Channel Impedance Value: 540 Ohm
Lead Channel Setting Pacing Amplitude: 2.5 V
Lead Channel Setting Pacing Pulse Width: 0.4 ms
Lead Channel Setting Sensing Sensitivity: 0.5 mV
Pulse Gen Serial Number: 226601

## 2021-09-29 ENCOUNTER — Ambulatory Visit (HOSPITAL_COMMUNITY)
Admission: RE | Admit: 2021-09-29 | Discharge: 2021-09-29 | Disposition: A | Discharge status: Home / Self Care | Payer: HMO | Source: Ambulatory Visit | Attending: Internal Medicine | Admitting: Internal Medicine

## 2021-09-29 ENCOUNTER — Encounter (HOSPITAL_COMMUNITY): Payer: Self-pay | Admitting: Internal Medicine

## 2021-09-29 VITALS — BP 140/110 | HR 110 | Wt 216.8 lb

## 2021-09-29 DIAGNOSIS — G4733 Obstructive sleep apnea (adult) (pediatric): Secondary | ICD-10-CM | POA: Diagnosis not present

## 2021-09-29 DIAGNOSIS — K589 Irritable bowel syndrome without diarrhea: Secondary | ICD-10-CM | POA: Insufficient documentation

## 2021-09-29 DIAGNOSIS — R008 Other abnormalities of heart beat: Secondary | ICD-10-CM | POA: Diagnosis not present

## 2021-09-29 DIAGNOSIS — E119 Type 2 diabetes mellitus without complications: Secondary | ICD-10-CM | POA: Diagnosis not present

## 2021-09-29 DIAGNOSIS — I10 Essential (primary) hypertension: Secondary | ICD-10-CM

## 2021-09-29 DIAGNOSIS — Z9884 Bariatric surgery status: Secondary | ICD-10-CM | POA: Diagnosis not present

## 2021-09-29 DIAGNOSIS — Z7984 Long term (current) use of oral hypoglycemic drugs: Secondary | ICD-10-CM | POA: Insufficient documentation

## 2021-09-29 DIAGNOSIS — I5022 Chronic systolic (congestive) heart failure: Secondary | ICD-10-CM

## 2021-09-29 DIAGNOSIS — F419 Anxiety disorder, unspecified: Secondary | ICD-10-CM | POA: Diagnosis not present

## 2021-09-29 DIAGNOSIS — I509 Heart failure, unspecified: Secondary | ICD-10-CM | POA: Diagnosis not present

## 2021-09-29 DIAGNOSIS — I110 Hypertensive heart disease with heart failure: Secondary | ICD-10-CM | POA: Insufficient documentation

## 2021-09-29 DIAGNOSIS — Z95810 Presence of automatic (implantable) cardiac defibrillator: Secondary | ICD-10-CM | POA: Diagnosis not present

## 2021-09-29 DIAGNOSIS — Z79899 Other long term (current) drug therapy: Secondary | ICD-10-CM | POA: Insufficient documentation

## 2021-09-29 DIAGNOSIS — I5042 Chronic combined systolic (congestive) and diastolic (congestive) heart failure: Secondary | ICD-10-CM

## 2021-09-29 DIAGNOSIS — I493 Ventricular premature depolarization: Secondary | ICD-10-CM | POA: Insufficient documentation

## 2021-09-29 DIAGNOSIS — F32A Depression, unspecified: Secondary | ICD-10-CM | POA: Diagnosis not present

## 2021-09-29 DIAGNOSIS — Z6836 Body mass index (BMI) 36.0-36.9, adult: Secondary | ICD-10-CM | POA: Insufficient documentation

## 2021-09-29 LAB — BASIC METABOLIC PANEL
Anion gap: 8 (ref 5–15)
BUN: 7 mg/dL (ref 6–20)
CO2: 25 mmol/L (ref 22–32)
Calcium: 8.9 mg/dL (ref 8.9–10.3)
Chloride: 104 mmol/L (ref 98–111)
Creatinine, Ser: 0.67 mg/dL (ref 0.44–1.00)
GFR, Estimated: >60 mL/min (ref >60)
Glucose, Bld: 92 mg/dL (ref 70–99)
Potassium: 3.9 mmol/L (ref 3.5–5.1)
Sodium: 137 mmol/L (ref 135–145)

## 2021-09-29 MED ORDER — ENTRESTO 97-103 MG PO TABS: 1.0000 | ORAL_TABLET | Freq: Two times a day (BID) | ORAL | 3 refills | Status: DC

## 2021-09-29 NOTE — Patient Instructions (Signed)
Medication Changes: ? ?INCREASE Entresto to 97-103 one tablet, two times a day (AM & PM) ? ?TAKE EXTRA DOSE OF TORSEMIDE TODAY ONLY.  ? ?Lab Work: ? ?Labs done today, your results will be available in MyChart, we will contact you for abnormal readings. ? ?You will need your blood work checked again in about 7 days.  ? ?Special Instructions // Education: ? ?Do the following things EVERYDAY: ?Weigh yourself in the morning before breakfast. Write it down and keep it in a log. ?Take your medicines as prescribed ?Eat low salt foods--Limit salt (sodium) to 2000 mg per day.  ?Stay as active as you can everyday ?Limit all fluids for the day to less than 2 liters ? ?Follow-Up in: 3 months ? ?At the [facility], you and your health needs are our priority. We have a designated team specialized in the treatment of Heart Failure. This Care Team includes your primary Heart Failure Specialized Cardiologist (physician), Advanced Practice Providers (APPs- Physician Assistants and Nurse Practitioners), and Pharmacist who all work together to provide you with the care you need, when you need it.  ? ?You may see any of the following providers on your designated Care Team at your next follow up: ? ?Dr Glori Bickers ?Dr Loralie Champagne ?Darrick Grinder, NP ?Lyda Jester, PA ?Jessica Milford,NP ?Marlyce Huge, PA ?Audry Riles, PharmD ? ? ?Please be sure to bring in all your medications bottles to every appointment.  ? ?Need to Contact us: ? ?If you have any questions or concerns before your next appointment please send [place] a message through [place] or call our office at [phone].   ? ?TO LEAVE A MESSAGE FOR THE NURSE SELECT OPTION 2, PLEASE LEAVE A MESSAGE INCLUDING: ?YOUR NAME ?DATE OF BIRTH ?CALL BACK NUMBER ?REASON FOR CALL**this is important as we prioritize the call backs ? ?YOU WILL RECEIVE A CALL BACK THE SAME DAY AS LONG AS YOU CALL BEFORE 4:00 PM ? ? ?

## 2021-09-29 NOTE — Progress Notes (Signed)
?Advanced Heart Failure Clinic Note  ? ?Referring Physician: ?PCP: Hoyt Koch, MD ?PCP-Cardiologist: Sinclair Grooms, MD  ?[facility]: Dr. Haroldine Laws  ? ?Reason for Visit: F/u for Chronic Systolic Heart Failure ? ?HPI: ?[name] is a 46 y.o. 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  ? ?Admitted 7/19 with CP Symptoms thought to be related to frequent PVCs. EP consulted and scheduled her for PVC ablation. Echo repeated 11/22/17 and showed improved EF 50-55%.  ?  ?Underwent PVC ablation 12/01/17. Was felt not to be successful.  Was on flecainide but discontinued due to reduced EF. Placed on amio ?  ?Echo 10/2018 showed drop in EF back down to 25-30%. Was instructed to f/u in clinic but pt did not. She had outpatient monitor 04/2018 that showed rare PVCs.  ?  ?Admitted 6/21 with chest pain and shortness of breath. Viral panel negative. ECHO EF < 20% and normal RV. Had cath with normal cors and preserved cardiac output. Plan was to f/u with EP regarding PVCs. She has remained on AAD therapy w/ amiodarone + ? blocker therapy. ? ?Admitted to [facility] 4/22 w/ increased dyspnea and volume overload. Also w/ increased PVC burden. Echo showed EF 20-25%, RV normal. She was diuresed w/ IV Lasix and placed on amiodarone gtt for PVC suppression. Seen by Dr. Lovena Le, due to the location of her PVCs, she is not a candidate for re-do ablation.  ? ?S/p Barostim 6/22. ? ?Zio 8/22: 13% PVCs (unifocal) ? ?Seen by EP 12/22 and Barostim titrated. Not felt to have any other options for PVCs.  ? ?Echo 06/24/21 EF 25-30% RV normal  ? ?Today she returns for HF follow up. Saw PCP last month and was started on mirtazapine. Says she has been limited by depression.  Asking  to cut back torsemide. Overall feeling fine. Says she hs more energy to do things. Occasionally short of breath with steps. Denies PND/Orthopnea. Appetite ok. No fever or chills. Weight at 205-207 home  pounds. Says she did not take torsemide or entresto today.  Taking all medications ? ?[employer] Score: 22  Activity 1 hour per day. Mean Heart Rate 94  No A fib or VT. No shocks. Impedance down. Personally reviewed.  ?    ?Cardiac Studies  ?- Cath (6/17): revealed normal vessels, moderate elevation in pulmonary artery pressures, and LVEF 25-30%. ?  ?- RHC (6/21):   ?RA = 4 ?RV = 32/6 ?PA = 31/8 (21) ?PCW = 12 ?Fick cardiac output/index = 7.0/3.4 ?Thermo CO/CI = 5.2/2.5 ?PVR =1.7 (Thermo) ?Ao sat = 98% ?PA sat = 67%, 69% ?  ?- Echo 6/15 35-40% ?- Echo 6/17 25-30% ?- Echo 12/17 20-25% ?- Echo 8/18 EF 25% ?- Echo 7/19: EF 50-55% ?- Echo 6/20: EF 25-30% ?- Echo 11/13/19: Ef < 20% RV normal  ?- Echo 4/22: EF 20-25%, RV normal  ?  ?- CPX (2/18): ?FVC 2.57 (79%)      ?FEV1 2.14 (81%)        ?FEV1/FVC 83 (101%)        ?MVV 107 (102%) ?Resting HR: 106 Peak HR: 166   (93% age predicted max HR) ?BP rest: 136/98 BP peak: 174/88 ?Peak VO2: 17.1 (85% predicted peak VO2) - corrects  to 28.4 for ibw ?VE/VCO2 slope:  33 ?OUES: 2.16 ?Peak RER: 1.09 ?Ventilatory Threshold: 14.6 (73% predicted or measured peak VO2) ?VE/MVV:  59% ?PETCO2 at peak:  33 ?O2pulse:  10   (83% predicted O2pulse) ?  ?- CPX (12/18): ?pVO2 14.0 (corrects to 23.0 for IBW) ?VeVCO2  32 ?RER 1.0 ? ?- PFTs w/ DLOC (4/22) ?Mild Restriction ?Normal Diffusion ?  ?Review of systems complete and found to be negative unless listed in HPI.   ? ?Past Medical History:  ?Diagnosis Date  ? Acute on chronic systolic (congestive) heart failure ([place]) 04/29/2017  ? Acute pain of right shoulder 06/16/2017  ? AICD (automatic cardioverter/defibrillator) present   ? Anginal pain ([place])   ? Anxiety   ? Arthritis   ? right shoulder   ? Asthma   ? CHF (congestive heart failure) ([place])    ? Chronic combined systolic and diastolic heart failure ([place]) 03/05/2014  ? Closed low lateral malleolus fracture 10/23/2013  ? Cystitis 10/21/2017  ? Depression   ? Depression with anxiety 01/20/2013  ? Diabetes mellitus without complication ([place])   ? Diverticulosis   ? Dyspnea   ? comes and goes intermittently mostly with exertion   ? Dysrhythmia   ? Essential hypertension   ? Prev followed by [facility]/ Cardiology   ? Fibroid   ? age 68  ? Gallstones   ? Generalized abdominal cramping   ? History of cardiomyopathy   ? Hypertension   ? IBS (irritable bowel syndrome)   ? ICD (implantable cardioverter-defibrillator), single, 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  ? Insomnia 05/07/2017  ? Labral tear of shoulder 04/04/2015  ?  Injected 04/04/2015 Injected 12/03/2015   ? Migraine   ? "monthly" (08/03/2016)  ? Myofascial pain 06/16/2017  ? NICM (nonischemic cardiomyopathy) ([place]) 08/03/2016  ? Nonallopathic lesion of lumbosacral region 11/16/2016  ? Nonallopathic lesion of sacral region 11/16/2016  ? Nonallopathic lesion of thoracic region 08/20/2014  ? Nonspecific chest pain 04/28/2017  ? OSA (obstructive sleep apnea) 01/02/2013  ? NPSG 2009:  AHI 9/hr. CPAP intolerance >> "smothering" Good tolerance of auto device (optimal pressure 12-13 on download).  - referred to Dr Gwenette Greet    ? OSA on CPAP   ? Ovarian cyst   ? 1999; surgically removed  ? Patellofemoral syndrome of both knees 10/16/2016  ? 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 PVC (premature ventricular contraction) 06/23/2016  ? Seizures ([place])   ? "as a child" (08/03/2016)  ? Termination of pregnancy   ? due to cardiac risk  ? ? ?Current Outpatient Medications  ?Medication Sig Dispense Refill  ? acetaminophen (TYLENOL) 650 MG CR tablet Take 650 mg by mouth every 8 (eight) hours as needed for pain (headache).    ? albuterol (PROAIR HFA) 108 (90 Base) MCG/ACT inhaler Inhale 1-2 puffs into the lungs every 6 (six) hours as needed for wheezing or shortness of breath.  Follow-up appt is due must see provider for future refills 18 g 0  ? amiodarone (PACERONE) 200 MG tablet Take 1 tablet (200 mg total) by mouth daily. 90 tablet 3  ? budesonide-formoterol (SYMBICORT) 160-4.5 MCG/ACT inhaler Inhale 2 puffs into the lungs 2 (two) times daily as needed. Follow-up appt is due must see provider for future refills 10 g 0  ? CORLANOR 7.5 MG TABS tablet TAKE 1 TABLET BY MOUTH TWICE DAILY WITH A MEAL 60 tablet 0  ? diclofenac (VOLTAREN) 75 MG EC tablet Take 1 tablet by mouth twice  daily 30 tablet 0  ? diclofenac Sodium (VOLTAREN) 1 % GEL Apply 4 g topically 4 (four) times daily. 300 g 6  ? dicyclomine (BENTYL) 20 MG tablet Take 1 tablet (20 mg total) by mouth daily as needed for spasms. 90 tablet 3  ? empagliflozin (JARDIANCE) 10 MG TABS tablet Take 1 tablet (10 mg total) by mouth daily. 30 tablet 3  ? fluticasone (FLONASE) 50 MCG/ACT nasal spray Place 2 sprays into both nostrils daily as needed for allergies. Follow-up appt is due must see provider for future refills 16 g 0  ? gabapentin (NEURONTIN) 300 MG capsule Take 1 capsule (300 mg total) by mouth at bedtime as needed (back spasms). 90 capsule 3  ? ipratropium-albuterol (DUONEB) 0.5-2.5 (3) MG/3ML SOLN Take 3 mLs by nebulization every 6 (six) hours as needed. 360 mL 0  ? lidocaine (LIDODERM) 5 % Place 1 patch onto the skin daily. Remove & Discard patch within 12 hours or as directed by MD 90 patch 3  ? Magnesium Oxide 200 MG TABS Take 1 tablet (200 mg total) by mouth daily. 90 tablet 3  ? metoprolol succinate (TOPROL-XL) 50 MG 24 hr tablet Take 1 tablet (50 mg total) by mouth daily. Take with or immediately following a meal. 30 tablet 3  ? mexiletine (MEXITIL) 200 MG capsule Take 1 capsule (200 mg total) by mouth 2 (two) times daily. 60 capsule 4  ? mirtazapine (REMERON) 15 MG tablet Take 1 tablet (15 mg total) by mouth at bedtime. 30 tablet 2  ? Multiple Vitamins-Minerals (MULTIVITAMIN GUMMIES WOMENS PO) Take 2 tablets by mouth daily.     ? nitroGLYCERIN (NITROSTAT) 0.4 MG SL tablet Place 1 tablet (0.4 mg total) under the tongue every 5 (five) minutes as needed for chest pain. 75 tablet 0  ? potassium chloride SA (KLOR-CON M) 20 M

## 2021-09-30 ENCOUNTER — Telehealth: Payer: Self-pay | Admitting: Internal Medicine

## 2021-09-30 NOTE — Telephone Encounter (Signed)
Spoke with [name] and advised per Oda Kilts, PA-C he would like to see and evaluate [name] further tomorrow, 10/01/2021.  [name] scheduled at 940am.  [name] verbalizes understanding and agrees with current plan. ?

## 2021-09-30 NOTE — Telephone Encounter (Signed)
Geni Bers with [facility] calling on behalf of the patient, she states that Barrington Ellison told her if she starts having any issues to please call the office. Geni Bers states that patient is saying that ever since her device was implanted she feels pressure on her vocal cords and it is at times hard to talk and swallow. Geni Bers is trying to keep her out of the emergency room but is requesting a sooner appt than the one she has scheduled on 05/23 with Barrington Ellison. Please call the patient back directly.  ?

## 2021-10-01 ENCOUNTER — Encounter: Payer: Self-pay | Admitting: Student

## 2021-10-01 ENCOUNTER — Ambulatory Visit (INDEPENDENT_AMBULATORY_CARE_PROVIDER_SITE_OTHER): Payer: HMO | Admitting: Student

## 2021-10-01 ENCOUNTER — Telehealth: Payer: Self-pay

## 2021-10-01 DIAGNOSIS — I509 Heart failure, unspecified: Secondary | ICD-10-CM | POA: Diagnosis not present

## 2021-10-01 NOTE — Progress Notes (Signed)
Pt added on for stim from Barostim.  ? ?Patient came in at 11.8 mA with stim presented as "tongue being pulled back", difficulty swallowing and talking, and could feel it like the sensation of a TENS unit when lying on left side.  ? ?She still feels good overall with improved energy level. Caring for a small baby without issue.  ? ?Device turned down to 11.0 mA with resolution of symptoms. They could be re-created at both 11.8 and 11.4. ? ?RTC 3 months. Sooner with issues.  ? ?Lollie Marrow, PA-C  ?10/01/2021 10:08 AM  ? ? ?

## 2021-10-01 NOTE — Telephone Encounter (Signed)
Unscheduled manual transmission received. Follow up as scheduled ?HL=46 ?Route for HL spike ?LA ? ?Discussed with A. Tillery, PA as patient was seen in clinic this am for barostim monitoring. Patient admitted to feeling well. Noted thoracic impedance is down. PA advised for patient to take an additional torsemide 20 mg today and tomorrow for a total of 40 mg daily x 2.  ? ?Unsuccessful telephone encounter to patient to advise. [employer] compliant VM message left requesting call back to [phone]. ? ? ? ? ? ? ? ? ?

## 2021-10-01 NOTE — Telephone Encounter (Signed)
Patient calling back. She is advised of HL score and recommendations to take an additional Demadex today and tomorrow and to monitor for s/s volume overload. Patient appreciative of call and will take medication as advised.  ?

## 2021-10-01 NOTE — Patient Instructions (Addendum)

## 2021-10-02 NOTE — Progress Notes (Signed)
Remote ICD transmission.   

## 2021-10-06 ENCOUNTER — Ambulatory Visit (INDEPENDENT_AMBULATORY_CARE_PROVIDER_SITE_OTHER): Payer: HMO

## 2021-10-06 DIAGNOSIS — I5042 Chronic combined systolic (congestive) and diastolic (congestive) heart failure: Secondary | ICD-10-CM | POA: Diagnosis not present

## 2021-10-06 DIAGNOSIS — Z95810 Presence of automatic (implantable) cardiac defibrillator: Secondary | ICD-10-CM

## 2021-10-07 NOTE — Progress Notes (Signed)
EPIC Encounter for ICM Monitoring ? ?Patient Name: [name] is a 46 y.o. female ?Date: 10/07/2021 ?Primary Care Physican: Hoyt Koch, MD ?Primary Cardiologist: Bensimhon ?Electrophysiologist: Caryl Comes ?10/07/2021 Weight: 212 lbs                                                 ?  ?Spoke with patient and heart failure questions reviewed.  Pt weight has returned to baseline after taking extra Torsemide as instructed by Oda Kilts, PA.    ?  ?Heartlogic HF Index 6 suggesting possible fluid levels returned to normal.  Barostim implant 6/27. ?  ?Prescribed: ?Torsemide 20 mg Take 1 tablet  (20 mg total) by mouth daily.  Pt self adjusts Torsemide as needed. ?Potassium 20 mEq take 1 tablet daily ?Spironolactone 25 mg take 1 tablet (25 mg total) at bedtime.  ?  ?Labs: ?09/29/2021 Creatinine 0.67, BUN 7,   Potassium 3.9, Sodium 137, GFR >60 ?06/24/2021 Creatinine 0.83, BUN 9,   Potassium 3.6, Sodium 139 ?A complete set of results can be found in Results Review. ?  ?Recommendations:  No changes and encouraged to call if experiencing any fluid symptoms. ?  ?Follow-up plan: ICM clinic phone appointment on 10/21/2021 to recheck fluid levels.  91 day device clinic remote transmission 12/17/2021.      ?  ?      EP/Cardiology next office visit:   01/05/2022 with Oda Kilts, [place].  01/01/2022 with HF clinic.   ?  ?      Copy of ICM check sent to Dr. Caryl Comes.  ? ?3 Month Trend ? ? ? ?8 Day Data Trend ?       ? ? ?[name], RN ?10/07/2021 ?1:14 PM ?

## 2021-10-08 ENCOUNTER — Ambulatory Visit (HOSPITAL_COMMUNITY)
Admission: RE | Admit: 2021-10-08 | Discharge: 2021-10-08 | Disposition: A | Discharge status: Home / Self Care | Payer: HMO | Source: Ambulatory Visit | Attending: Internal Medicine | Admitting: Internal Medicine

## 2021-10-08 DIAGNOSIS — I5042 Chronic combined systolic (congestive) and diastolic (congestive) heart failure: Secondary | ICD-10-CM | POA: Diagnosis not present

## 2021-10-08 LAB — BASIC METABOLIC PANEL
Anion gap: 8 (ref 5–15)
BUN: 10 mg/dL (ref 6–20)
CO2: 28 mmol/L (ref 22–32)
Calcium: 9.6 mg/dL (ref 8.9–10.3)
Chloride: 103 mmol/L (ref 98–111)
Creatinine, Ser: 1.01 mg/dL — ABNORMAL HIGH (ref 0.44–1.00)
GFR, Estimated: >60 mL/min (ref >60)
Glucose, Bld: 100 mg/dL — ABNORMAL HIGH (ref 70–99)
Potassium: 3.7 mmol/L (ref 3.5–5.1)
Sodium: 139 mmol/L (ref 135–145)

## 2021-10-14 ENCOUNTER — Encounter: Payer: HMO | Admitting: Student

## 2021-10-21 ENCOUNTER — Encounter (HOSPITAL_COMMUNITY): Payer: Self-pay | Admitting: Internal Medicine

## 2021-10-21 ENCOUNTER — Other Ambulatory Visit: Payer: Self-pay | Admitting: Internal Medicine

## 2021-10-21 ENCOUNTER — Other Ambulatory Visit: Payer: Self-pay | Admitting: Family Medicine

## 2021-10-22 ENCOUNTER — Other Ambulatory Visit (HOSPITAL_COMMUNITY): Payer: Self-pay | Admitting: *Deleted

## 2021-10-22 MED ORDER — METOPROLOL SUCCINATE ER 50 MG PO TB24: 50.0000 mg | ORAL_TABLET | Freq: Every day | ORAL | 3 refills | Status: DC

## 2021-10-23 ENCOUNTER — Telehealth: Payer: Self-pay

## 2021-10-23 NOTE — Telephone Encounter (Signed)
LMOVM for pt to send missed ICM transmission.

## 2021-10-27 ENCOUNTER — Ambulatory Visit (INDEPENDENT_AMBULATORY_CARE_PROVIDER_SITE_OTHER): Payer: HMO

## 2021-10-28 NOTE — Progress Notes (Deleted)
[place] Phone: [phone] Subjective:    I'm seeing this patient by the request  of:  Hoyt Koch, MD  CC:   [id number]  ROLENE ANDRADES is a 46 y.o. female coming in with complaint of R shoulder pain. Seen in November 2022 for trigger points in R shoulder. Patient states       Past Medical History:  Diagnosis Date   Acute on chronic systolic (congestive) heart failure ([place]) 04/29/2017   Acute pain of right shoulder 06/16/2017   AICD (automatic cardioverter/defibrillator) present    Anginal pain ([facility])    Anxiety    Arthritis    right shoulder    Asthma    CHF (congestive heart failure) ([place])    Chronic combined systolic and diastolic heart failure ([place]) 03/05/2014   Closed low lateral malleolus fracture 10/23/2013   Cystitis 10/21/2017   Depression    Depression with anxiety 01/20/2013   Diabetes mellitus without complication ([facility])    Diverticulosis    Dyspnea    comes and goes intermittently mostly with exertion    Dysrhythmia    Essential hypertension    Prev followed by [name]    Fibroid    age 89   Gallstones    Generalized abdominal cramping    History of cardiomyopathy    Hypertension    IBS (irritable bowel syndrome)    ICD (implantable cardioverter-defibrillator), single, 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 CPAP intolerance >> "smothering" Good tolerance of auto device (optimal pressure 12-13  on download).  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   PVC (premature ventricular contraction) 06/23/2016   Seizures ([place])    "as a child" (08/03/2016)   Termination of pregnancy    due to cardiac risk   Past Surgical History:  Procedure Laterality Date   CARDIAC CATHETERIZATION N/A 11/11/2015   Procedure: Right/Left Heart Cath and Coronary Angiography;  Surgeon: Troy Sine, MD;  Location: [place] [facility];  Service: Cardiovascular;  Laterality: N/A;   CARDIAC CATHETERIZATION  ~ 2015   CARDIAC DEFIBRILLATOR PLACEMENT  08/03/2016   CESAREAN SECTION  1999   COLONOSCOPY WITH PROPOFOL N/A 04/21/2016   Procedure: COLONOSCOPY WITH PROPOFOL;  Surgeon: Jerene Bears, MD;  Location: [facility];  Service: Gastroenterology;  Laterality: N/A;   ESOPHAGOGASTRODUODENOSCOPY (EGD) WITH PROPOFOL N/A 04/21/2016   Procedure: ESOPHAGOGASTRODUODENOSCOPY (EGD) WITH PROPOFOL;  Surgeon: Jerene Bears, MD;  Location: [facility];  Service: Gastroenterology;  Laterality: N/A;   FOOT FRACTURE SURGERY Right ~ 2003   FRACTURE SURGERY     ICD IMPLANT N/A 08/03/2016   Procedure: ICD Implant;  Surgeon: Deboraha Sprang, MD;  Location: [place];  Service: Cardiovascular;  Laterality: N/A;   LAPAROSCOPIC CHOLECYSTECTOMY  12/2006  LAPAROSCOPIC GASTRIC SLEEVE RESECTION     LAPAROSCOPY ABDOMEN DIAGNOSTIC  2008   "cut bile duct w/gallbladder OR; had to go in later & fix leak; hospitalized for 2 months"   LEFT HEART CATHETERIZATION WITH CORONARY ANGIOGRAM N/A 02/26/2014   Procedure: LEFT HEART CATHETERIZATION WITH CORONARY ANGIOGRAM;  Surgeon: Jettie Booze, MD;  Location: [facility] CATH LAB;  Service: Cardiovascular;  Laterality: N/A;   OVARIAN CYST REMOVAL Right 1999   PVC ABLATION N/A 12/01/2017   Procedure: PVC ABLATION;  Surgeon: Evans Lance,  MD;  Location: [place] [facility];  Service: Cardiovascular;  Laterality: N/A;   RIGHT HEART CATH N/A 08/05/2017   Procedure: RIGHT HEART CATH;  Surgeon: Jolaine Artist, MD;  Location: [place] [facility];  Service: Cardiovascular;  Laterality: N/A;   RIGHT HEART CATH N/A 11/16/2019   Procedure: RIGHT HEART CATH;  Surgeon: Jolaine Artist, MD;  Location: [place] [facility] [facility];  Service: Cardiovascular;  Laterality: N/A;   TUBAL LIGATION  1999   Social History   Socioeconomic History   Marital status: Married    Spouse name: Not on file   Number of children: 1   Years of education: Not on file   Highest education level: Not on file  Occupational History   Occupation: stay at home mom  Tobacco Use   Smoking status: Never   Smokeless tobacco: Never  Vaping Use   Vaping Use: Never used  Substance and Sexual Activity   Alcohol use: No   Drug use: No   Sexual activity: Yes  Other Topics Concern   Not on file  Social History Narrative   Not on file   Social Determinants of Health   Financial Resource Strain: Not on file  Food Insecurity: Not on file  Transportation Needs: Not on file  Physical Activity: Not on file  Stress: Not on file  Social Connections: Not on file   Allergies  Allergen Reactions   Vancomycin Other (See Comments)    "did something to my kidneys," PROGRESSED TO KIDNEY FAILURE!!   Aspirin Other (See Comments)    Wheezing, (Pt states that she just wheezes some when she takes aspirin by itself but she can take aspirin in a combination product).   Contrast Media [Iodinated Contrast Media] Other (See Comments)    Multiple CT contrast studies done over 2 weeks caused ARF   Cyclobenzaprine Other (See Comments)    Can not tolerate   Ciprofloxacin Itching and Rash   Farxiga [Dapagliflozin] Rash   Sulfa Antibiotics Itching and Rash   Family History  Problem Relation Age of Onset   Emphysema Maternal Grandmother        smoked   Heart disease Maternal  Grandmother 36       MI   Rheum arthritis Mother    Allergies Daughter    Colon cancer Neg Hx     Current Outpatient Medications (Endocrine & Metabolic):    empagliflozin (JARDIANCE) 10 MG TABS tablet, Take 1 tablet (10 mg total) by mouth daily.  Current Outpatient Medications (Cardiovascular):    amiodarone (PACERONE) 200 MG tablet, Take 1 tablet (200 mg total) by mouth daily.   CORLANOR 7.5 MG TABS tablet, TAKE 1 TABLET BY MOUTH TWICE DAILY WITH A MEAL   metoprolol succinate (TOPROL-XL) 50 MG 24 hr tablet, Take 1 tablet (50 mg total) by mouth daily. Take with or immediately following a meal.   mexiletine (MEXITIL) 200 MG capsule, Take 1 capsule (200 mg total) by  mouth 2 (two) times daily.   nitroGLYCERIN (NITROSTAT) 0.4 MG SL tablet, Place 1 tablet (0.4 mg total) under the tongue every 5 (five) minutes as needed for chest pain.   sacubitril-valsartan (ENTRESTO) 97-103 MG, Take 1 tablet by mouth 2 (two) times daily.   spironolactone (ALDACTONE) 25 MG tablet, Take 1 tablet (25 mg total) by mouth at bedtime.   torsemide (DEMADEX) 20 MG tablet, Take 20 mg by mouth daily.  Current Outpatient Medications (Respiratory):    albuterol (PROAIR HFA) 108 (90 Base) MCG/ACT inhaler, Inhale 1-2 puffs into the lungs every 6 (six) hours as needed for wheezing or shortness of breath. Follow-up appt is due must see provider for future refills   budesonide-formoterol (SYMBICORT) 160-4.5 MCG/ACT inhaler, Inhale 2 puffs into the lungs 2 (two) times daily as needed. Follow-up appt is due must see provider for future refills   fluticasone (FLONASE) 50 MCG/ACT nasal spray, Place 2 sprays into both nostrils daily as needed for allergies. Follow-up appt is due must see provider for future refills   ipratropium-albuterol (DUONEB) 0.5-2.5 (3) MG/3ML SOLN, Take 3 mLs by nebulization every 6 (six) hours as needed.  Current Outpatient Medications (Analgesics):    acetaminophen (TYLENOL) 650 MG CR tablet, Take 650 mg by  mouth every 8 (eight) hours as needed for pain (headache).   diclofenac (VOLTAREN) 75 MG EC tablet, Take 1 tablet by mouth twice daily   Ubrogepant (UBRELVY) 100 MG TABS, Take 100 mg by mouth daily as needed (migraine).   Current Outpatient Medications (Other):    diclofenac Sodium (VOLTAREN) 1 % GEL, Apply 4 g topically 4 (four) times daily.   dicyclomine (BENTYL) 20 MG tablet, Take 1 tablet (20 mg total) by mouth daily as needed for spasms.   gabapentin (NEURONTIN) 300 MG capsule, Take 1 capsule (300 mg total) by mouth at bedtime as needed (back spasms).   lidocaine (LIDODERM) 5 %, Place 1 patch onto the skin daily. Remove & Discard patch within 12 hours or as directed by MD   Magnesium Oxide 200 MG TABS, Take 1 tablet (200 mg total) by mouth daily.   mirtazapine (REMERON) 15 MG tablet, Take 1 tablet (15 mg total) by mouth at bedtime.   Multiple Vitamins-Minerals (MULTIVITAMIN GUMMIES WOMENS PO), Take 2 tablets by mouth daily.   potassium chloride SA (KLOR-CON M) 20 MEQ tablet, Take 20 mEq by mouth daily.   tiZANidine (ZANAFLEX) 2 MG tablet, TAKE 1 TABLET BY MOUTH AT BEDTIME   Vitamin D, Ergocalciferol, (DRISDOL) 1.25 MG (50000 UNIT) CAPS capsule, Take 1 capsule (50,000 Units total) by mouth every 7 (seven) days.   Reviewed prior external information including notes and imaging from  primary care provider As well as notes that were available from care everywhere and other healthcare systems.  Past medical history, social, surgical and family history all reviewed in electronic medical record.  No pertanent information unless stated regarding to the chief complaint.   Review of Systems:  No headache, visual changes, nausea, vomiting, diarrhea, constipation, dizziness, abdominal pain, skin rash, fevers, chills, night sweats, weight loss, swollen lymph nodes, body aches, joint swelling, chest pain, shortness of breath, mood changes. POSITIVE muscle aches  Objective  There were no vitals  taken for this visit.   General: No apparent distress alert and oriented x3 mood and affect normal, dressed appropriately.  HEENT: Pupils equal, extraocular movements intact  Respiratory: [name] speak in full sentences and does not appear short of breath  Cardiovascular: No lower extremity edema, non  tender, no erythema  Gait normal with good balance and coordination.  MSK:  Non tender with full range of motion and good stability and symmetric strength and tone of shoulders, elbows, wrist, hip, knee and ankles bilaterally.     Impression and Recommendations:     The above documentation has been reviewed and is accurate and complete Jacqualin Combes

## 2021-10-29 ENCOUNTER — Ambulatory Visit: Payer: HMO | Admitting: Family Medicine

## 2021-10-30 NOTE — Progress Notes (Signed)
EPIC Encounter for ICM Monitoring  Patient Name: [name] is a 46 y.o. female Date: 10/30/2021 Primary Care Physican: Hoyt Koch, MD Primary Cardiologist: Bensimhon Electrophysiologist: Caryl Comes 10/07/2021 Weight: 212 lbs                                                   Attempted call to patient and unable to reach.  Left detailed message per DPR regarding transmission. Transmission reviewed.     Heartlogic HF Index 15 suggesting possible fluid accumulation. Thoracic impedance trending up suggesting fluid levels may be improving.  Barostim implant 6/27.   Prescribed: Torsemide 20 mg Take 1 tablet  (20 mg total) by mouth daily.  Pt self adjusts Torsemide as needed. Potassium 20 mEq take 1 tablet daily Spironolactone 25 mg take 1 tablet (25 mg total) at bedtime.    Labs: 09/29/2021 Creatinine 0.67, BUN 7,   Potassium 3.9, Sodium 137, GFR >60 06/24/2021 Creatinine 0.83, BUN 9,   Potassium 3.6, Sodium 139 A complete set of results can be found in Results Review.   Recommendations: Left voice mail with ICM number and encouraged to call if experiencing any fluid symptoms.   Follow-up plan: ICM clinic phone appointment on 11/10/2021.  91 day device clinic remote transmission 12/17/2021.              EP/Cardiology next office visit:   01/05/2022 with Oda Kilts, [place].  01/01/2022 with HF clinic.           Copy of ICM check sent to Dr. Caryl Comes.  Will send to Dr Haroldine Laws for review if patient is reached.  3 Month Trend    8 Day Data Trend          [name], RN 10/30/2021 4:03 PM

## 2021-11-06 ENCOUNTER — Ambulatory Visit: Payer: HMO | Admitting: Family Medicine

## 2021-11-10 ENCOUNTER — Ambulatory Visit (INDEPENDENT_AMBULATORY_CARE_PROVIDER_SITE_OTHER): Payer: HMO

## 2021-11-10 DIAGNOSIS — Z95810 Presence of automatic (implantable) cardiac defibrillator: Secondary | ICD-10-CM | POA: Diagnosis not present

## 2021-11-10 DIAGNOSIS — I5042 Chronic combined systolic (congestive) and diastolic (congestive) heart failure: Secondary | ICD-10-CM

## 2021-11-10 NOTE — Progress Notes (Deleted)
[name] Phone: [phone] Subjective:    I'm seeing this patient by the request  of:  Hoyt Koch, MD  CC:   [id number]  Last seen December 2022 for OMT  [name] is a 46 y.o. female coming in with complaint of shoulder pain       Past Medical History:  Diagnosis Date   Acute on chronic systolic (congestive) heart failure ([place]) 04/29/2017   Acute pain of right shoulder 06/16/2017   AICD (automatic cardioverter/defibrillator) present    Anginal pain (HCC)    Anxiety    Arthritis    right shoulder    Asthma    CHF (congestive heart failure) ([place])    Chronic combined systolic and diastolic heart failure ([place]) 03/05/2014   Closed low lateral malleolus fracture 10/23/2013   Cystitis 10/21/2017   Depression    Depression with anxiety 01/20/2013   Diabetes mellitus without complication (HCC)    Diverticulosis    Dyspnea    comes and goes intermittently mostly with exertion    Dysrhythmia    Essential hypertension    Prev followed by [name]    Fibroid    age 57   Gallstones    Generalized abdominal cramping    History of cardiomyopathy    Hypertension    IBS (irritable bowel syndrome)    ICD (implantable cardioverter-defibrillator), single, 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   Insomnia 05/07/2017   Labral tear of shoulder 04/04/2015    Injected 04/04/2015 Injected 12/03/2015    Migraine    "monthly" (08/03/2016)   Myofascial pain 06/16/2017   NICM (nonischemic cardiomyopathy) ([place]) 08/03/2016   Nonallopathic lesion of lumbosacral region 11/16/2016   Nonallopathic lesion of sacral region 11/16/2016   Nonallopathic lesion of thoracic region 08/20/2014   Nonspecific chest pain 04/28/2017   OSA (obstructive sleep apnea) 01/02/2013   NPSG 2009:  AHI 9/hr. CPAP intolerance >> "smothering" Good tolerance of auto device (optimal pressure 12-13 on download).  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   PVC (premature ventricular contraction) 06/23/2016   Seizures ([place])    "as a child" (08/03/2016)   Termination of pregnancy    due to cardiac risk   Past Surgical History:  Procedure Laterality Date   CARDIAC CATHETERIZATION N/A 11/11/2015   Procedure: Right/Left Heart Cath and Coronary Angiography;  Surgeon: Troy Sine, MD;  Location: [facility];  Service: Cardiovascular;  Laterality: N/A;   CARDIAC CATHETERIZATION  ~ 2015   CARDIAC DEFIBRILLATOR PLACEMENT  08/03/2016   CESAREAN SECTION  1999   COLONOSCOPY WITH PROPOFOL N/A 04/21/2016   Procedure: COLONOSCOPY WITH PROPOFOL;  Surgeon: Jerene Bears, MD;  Location: [facility];  Service: Gastroenterology;  Laterality: N/A;   ESOPHAGOGASTRODUODENOSCOPY (EGD) WITH PROPOFOL N/A 04/21/2016   Procedure: ESOPHAGOGASTRODUODENOSCOPY (EGD) WITH PROPOFOL;  Surgeon: Jerene Bears, MD;  Location: [facility];  Service: Gastroenterology;  Laterality: N/A;   FOOT FRACTURE SURGERY Right ~ 2003   FRACTURE SURGERY     ICD IMPLANT N/A 08/03/2016   Procedure: ICD Implant;  Surgeon: Deboraha Sprang, MD;  Location: [facility];  Service: Cardiovascular;  Laterality: N/A;   LAPAROSCOPIC CHOLECYSTECTOMY  12/2006   LAPAROSCOPIC GASTRIC SLEEVE RESECTION  LAPAROSCOPY ABDOMEN DIAGNOSTIC  2008   "cut bile duct w/gallbladder OR; had to go in later & fix leak; hospitalized for 2 months"   LEFT HEART CATHETERIZATION WITH CORONARY ANGIOGRAM N/A 02/26/2014   Procedure: LEFT HEART CATHETERIZATION WITH CORONARY ANGIOGRAM;  Surgeon: Jettie Booze, MD;  [facility];  Service: Cardiovascular;  Laterality: N/A;   OVARIAN CYST REMOVAL Right 1999   PVC ABLATION N/A 12/01/2017   Procedure: PVC ABLATION;  Surgeon: Evans Lance, MD;  Location: [facility];   Service: Cardiovascular;  Laterality: N/A;   RIGHT HEART CATH N/A 08/05/2017   Procedure: RIGHT HEART CATH;  Surgeon: Jolaine Artist, MD;  Location: [facility];  Service: Cardiovascular;  Laterality: N/A;   RIGHT HEART CATH N/A 11/16/2019   Procedure: RIGHT HEART CATH;  Surgeon: Jolaine Artist, MD;  Location: [facility];  Service: Cardiovascular;  Laterality: N/A;   TUBAL LIGATION  1999   Social History   Socioeconomic History   Marital status: Married    Spouse name: Not on file   Number of children: 1   Years of education: Not on file   Highest education level: Not on file  Occupational History   Occupation: stay at home mom  Tobacco Use   Smoking status: Never   Smokeless tobacco: Never  Vaping Use   Vaping Use: Never used  Substance and Sexual Activity   Alcohol use: No   Drug use: No   Sexual activity: Yes  Other Topics Concern   Not on file  Social History Narrative   Not on file   Social Determinants of Health   Financial Resource Strain: Not on file  Food Insecurity: No Food Insecurity (08/23/2019)   Hunger Vital Sign    Worried About Running Out of Food in the Last Year: Never true    Ran Out of Food in the Last Year: Never true  Transportation Needs: No Transportation Needs (08/23/2019)   PRAPARE - Hydrologist (Medical): No    Lack of Transportation (Non-Medical): No  Physical Activity: Insufficiently Active (11/28/2019)   Exercise Vital Sign    Days of Exercise per Week: 2 days    Minutes of Exercise per Session: 40 min  Stress: Not on file  Social Connections: Socially Isolated (11/28/2019)   Social Connection and Isolation Panel [NHANES]    Frequency of Communication with Friends and Family: Twice a week    Frequency of Social Gatherings with Friends and Family: Never    Attends Religious Services: Never    Marine scientist or Organizations: No    Attends Music therapist: Never     Marital Status: Married   Allergies  Allergen Reactions   Vancomycin Other (See Comments)    "did something to my kidneys," PROGRESSED TO KIDNEY FAILURE!!   Aspirin Other (See Comments)    Wheezing, (Pt states that she just wheezes some when she takes aspirin by itself but she can take aspirin in a combination product).   Contrast Media [Iodinated Contrast Media] Other (See Comments)    Multiple CT contrast studies done over 2 weeks caused ARF   Cyclobenzaprine Other (See Comments)    Can not tolerate   Ciprofloxacin Itching and Rash   Farxiga [Dapagliflozin] Rash   Sulfa Antibiotics Itching and Rash   Family History  Problem Relation Age of Onset   Emphysema Maternal Grandmother        smoked  Heart disease Maternal Grandmother 49       MI   Rheum arthritis Mother    Allergies Daughter    Colon cancer Neg Hx     Current Outpatient Medications (Endocrine & Metabolic):    empagliflozin (JARDIANCE) 10 MG TABS tablet, Take 1 tablet (10 mg total) by mouth daily.  Current Outpatient Medications (Cardiovascular):    amiodarone (PACERONE) 200 MG tablet, Take 1 tablet (200 mg total) by mouth daily.   CORLANOR 7.5 MG TABS tablet, TAKE 1 TABLET BY MOUTH TWICE DAILY WITH A MEAL   metoprolol succinate (TOPROL-XL) 50 MG 24 hr tablet, Take 1 tablet (50 mg total) by mouth daily. Take with or immediately following a meal.   mexiletine (MEXITIL) 200 MG capsule, Take 1 capsule (200 mg total) by mouth 2 (two) times daily.   nitroGLYCERIN (NITROSTAT) 0.4 MG SL tablet, Place 1 tablet (0.4 mg total) under the tongue every 5 (five) minutes as needed for chest pain.   sacubitril-valsartan (ENTRESTO) 97-103 MG, Take 1 tablet by mouth 2 (two) times daily.   spironolactone (ALDACTONE) 25 MG tablet, Take 1 tablet (25 mg total) by mouth at bedtime.   torsemide (DEMADEX) 20 MG tablet, Take 20 mg by mouth daily.  Current Outpatient Medications (Respiratory):    albuterol (PROAIR HFA) 108 (90 Base) MCG/ACT  inhaler, Inhale 1-2 puffs into the lungs every 6 (six) hours as needed for wheezing or shortness of breath. Follow-up appt is due must see provider for future refills   budesonide-formoterol (SYMBICORT) 160-4.5 MCG/ACT inhaler, Inhale 2 puffs into the lungs 2 (two) times daily as needed. Follow-up appt is due must see provider for future refills   fluticasone (FLONASE) 50 MCG/ACT nasal spray, Place 2 sprays into both nostrils daily as needed for allergies. Follow-up appt is due must see provider for future refills   ipratropium-albuterol (DUONEB) 0.5-2.5 (3) MG/3ML SOLN, Take 3 mLs by nebulization every 6 (six) hours as needed.  Current Outpatient Medications (Analgesics):    acetaminophen (TYLENOL) 650 MG CR tablet, Take 650 mg by mouth every 8 (eight) hours as needed for pain (headache).   diclofenac (VOLTAREN) 75 MG EC tablet, Take 1 tablet by mouth twice daily   Ubrogepant (UBRELVY) 100 MG TABS, Take 100 mg by mouth daily as needed (migraine).   Current Outpatient Medications (Other):    diclofenac Sodium (VOLTAREN) 1 % GEL, Apply 4 g topically 4 (four) times daily.   dicyclomine (BENTYL) 20 MG tablet, Take 1 tablet (20 mg total) by mouth daily as needed for spasms.   gabapentin (NEURONTIN) 300 MG capsule, Take 1 capsule (300 mg total) by mouth at bedtime as needed (back spasms).   lidocaine (LIDODERM) 5 %, Place 1 patch onto the skin daily. Remove & Discard patch within 12 hours or as directed by MD   Magnesium Oxide 200 MG TABS, Take 1 tablet (200 mg total) by mouth daily.   mirtazapine (REMERON) 15 MG tablet, Take 1 tablet (15 mg total) by mouth at bedtime.   Multiple Vitamins-Minerals (MULTIVITAMIN GUMMIES WOMENS PO), Take 2 tablets by mouth daily.   potassium chloride SA (KLOR-CON M) 20 MEQ tablet, Take 20 mEq by mouth daily.   tiZANidine (ZANAFLEX) 2 MG tablet, TAKE 1 TABLET BY MOUTH AT BEDTIME   Vitamin D, Ergocalciferol, (DRISDOL) 1.25 MG (50000 UNIT) CAPS capsule, Take 1 capsule  (50,000 Units total) by mouth every 7 (seven) days.   Reviewed prior external information including notes and imaging from  primary care provider As  well as notes that were available from care everywhere and other healthcare systems.  Past medical history, social, surgical and family history all reviewed in electronic medical record.  No pertanent information unless stated regarding to the chief complaint.   Review of Systems:  No headache, visual changes, nausea, vomiting, diarrhea, constipation, dizziness, abdominal pain, skin rash, fevers, chills, night sweats, weight loss, swollen lymph nodes, body aches, joint swelling, chest pain, shortness of breath, mood changes. POSITIVE muscle aches  Objective  There were no vitals taken for this visit.   General: No apparent distress alert and oriented x3 mood and affect normal, dressed appropriately.  HEENT: Pupils equal, extraocular movements intact  Respiratory: [name] speak in full sentences and does not appear short of breath  Cardiovascular: No lower extremity edema, non tender, no erythema      Impression and Recommendations:

## 2021-11-11 ENCOUNTER — Ambulatory Visit: Payer: HMO | Admitting: Family Medicine

## 2021-11-11 ENCOUNTER — Telehealth: Payer: Self-pay

## 2021-11-11 NOTE — Progress Notes (Signed)
EPIC Encounter for ICM Monitoring  Patient Name: [name] is a 46 y.o. female Date: 11/11/2021 Primary Care Physican: Hoyt Koch, MD Primary Cardiologist: Bensimhon Electrophysiologist: Caryl Comes 10/07/2021 Weight: 212 lbs                                                   Attempted call to patient and unable to reach.  Left detailed message per DPR regarding transmission. Transmission reviewed.      Heartlogic HF Index 25 suggesting possible fluid accumulation.  Index has been increasing since 6/9.  Barostim implant 6/27.   Prescribed: Torsemide 20 mg Take 1 tablet  (20 mg total) by mouth daily.  Pt self adjusts Torsemide as needed. Potassium 20 mEq take 1 tablet daily Spironolactone 25 mg take 1 tablet (25 mg total) at bedtime.    Labs: 09/29/2021 Creatinine 0.67, BUN 7,   Potassium 3.9, Sodium 137, GFR >60 06/24/2021 Creatinine 0.83, BUN 9,   Potassium 3.6, Sodium 139 A complete set of results can be found in Results Review.   Recommendations:  Left voice mail with ICM number and encouraged to call if experiencing any fluid symptoms.   Follow-up plan: ICM clinic phone appointment on 11/18/2021 to recheck fluid levels.  91 day device clinic remote transmission 12/17/2021.              EP/Cardiology next office visit:   01/05/2022 with Oda Kilts, [place].  01/01/2022 with HF clinic.           Copy of ICM check sent to Dr. Caryl Comes.  Will send copy to Dr Haroldine Laws for review if patient is reached.   3 Month Trend    8 Day Data Trend          [name], RN 11/11/2021 10:10 AM

## 2021-11-11 NOTE — Telephone Encounter (Signed)
Remote ICM transmission received.  Attempted call to patient regarding ICM remote transmission and left detailed message per DPR.  Advised to return call for any fluid symptoms or questions. Next ICM remote transmission scheduled 11/18/2021.

## 2021-11-18 ENCOUNTER — Ambulatory Visit: Payer: HMO

## 2021-11-18 DIAGNOSIS — I5042 Chronic combined systolic (congestive) and diastolic (congestive) heart failure: Secondary | ICD-10-CM

## 2021-11-18 DIAGNOSIS — Z95810 Presence of automatic (implantable) cardiac defibrillator: Secondary | ICD-10-CM

## 2021-11-19 NOTE — Progress Notes (Signed)
EPIC Encounter for ICM Monitoring  Patient Name: [name] is a 46 y.o. female Date: 11/19/2021 Primary Care Physican: Hoyt Koch, MD Primary Cardiologist: Bensimhon Electrophysiologist: Caryl Comes 11/19/2021 Weight: 214 lbs                                                 Spoke with patient and heart failure questions reviewed.  Pt reports swelling of feet and hands, 4 lb weight gain in the last week, dry cough and tiredness.  She thinks she is having more PVCs.    Heartlogic HF Index decreased on 6/21 to 11 from 25 but now rising again to 21 suggesting fluid level accumulation is worsening again.  Thoracic impedance dropping which is suggestive of fluid accumulation.   Barostim implant 6/27.   Prescribed: Torsemide 20 mg Take 1 tablet  (20 mg total) by mouth daily.  Pt self adjusts Torsemide as needed. Potassium 20 mEq take 1 tablet daily Spironolactone 25 mg take 1 tablet (25 mg total) at bedtime.    Labs: 09/29/2021 Creatinine 0.67, BUN 7,   Potassium 3.9, Sodium 137, GFR >60 06/24/2021 Creatinine 0.83, BUN 9,   Potassium 3.6, Sodium 139 A complete set of results can be found in Results Review.   Recommendations:  She self adjusts Torsemide when needed.  Copy sent to Dr Haroldine Laws for review and recommendations if needed.  Advised to go to ER if symptoms become worse.   Follow-up plan: ICM clinic phone appointment on 11/26/2021 to recheck fluid levels.  91 day device clinic remote transmission 12/17/2021.              EP/Cardiology next office visit:   01/05/2022 with Oda Kilts, [place].  01/01/2022 with HF clinic.           Copy of ICM check sent to Dr. Caryl Comes.  Will send copy to Dr Haroldine Laws for review if patient is reached.   3 Month Trend    8 Day Data Trend          [name], RN 11/19/2021 8:25 AM

## 2021-11-20 ENCOUNTER — Encounter (HOSPITAL_COMMUNITY): Payer: Self-pay | Admitting: Internal Medicine

## 2021-11-20 ENCOUNTER — Other Ambulatory Visit (HOSPITAL_COMMUNITY): Payer: Self-pay

## 2021-11-20 ENCOUNTER — Ambulatory Visit
Admission: EM | Admit: 2021-11-20 | Discharge: 2021-11-20 | Disposition: A | Discharge status: Home / Self Care | Payer: HMO | Attending: Emergency Medicine | Admitting: Emergency Medicine

## 2021-11-20 ENCOUNTER — Ambulatory Visit (INDEPENDENT_AMBULATORY_CARE_PROVIDER_SITE_OTHER): Payer: HMO

## 2021-11-20 DIAGNOSIS — M25511 Pain in right shoulder: Secondary | ICD-10-CM | POA: Diagnosis not present

## 2021-11-20 MED ORDER — NAPROXEN 500 MG PO TABS: 500.0000 mg | ORAL_TABLET | Freq: Two times a day (BID) | ORAL | 0 refills | Status: AC

## 2021-11-20 MED ORDER — EMPAGLIFLOZIN 10 MG PO TABS: 10.0000 mg | ORAL_TABLET | Freq: Every day | ORAL | 3 refills | Status: DC

## 2021-11-20 MED ORDER — METOPROLOL SUCCINATE ER 50 MG PO TB24: 50.0000 mg | ORAL_TABLET | Freq: Every day | ORAL | 3 refills | Status: DC

## 2021-11-20 NOTE — Discharge Instructions (Addendum)
Your x-ray was negative for any acute fracture.  You have some arthritis in the [facility] joint and some calcifications along the infraspinatus tendon.    Wear sling as needed for comfort.  Please follow-up with Dr. Tamala Julian if you are not better in a week.  May take the Aleve with 1000 mg of Tylenol twice a day.  May take an additional 1000 mg of Tylenol 2 more times a day.  Do not exceed 4000 mg of Tylenol from all sources in 1 day.  Try the Zanaflex and see if that helps.

## 2021-11-20 NOTE — ED Provider Notes (Signed)
HPI  SUBJECTIVE:  [name] is a right-handed 46 y.o. 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  She states it feels as if there is a pin going into her deltoid going across her back.  She describes the pain as achy, tight, constant.  She states that she was trying to lift her shoulder up by moving around, and felt a pop.  The pain did not change after this.  No bruising, numbness or tingling, no distal weakness, trauma to the area, erythema, swelling.  She tried Aleve and a hot shower with temporary improvement in her symptoms.  Symptoms are worse with abduction, external/internal rotation, and hanging it in a dependent position.  She has a past medical history of right labral tear, congestive heart failure, cardiomyopathy with an AICD, hypertension, diabetes.  She is status post gastric sleeve.  LMP: 6/20.  Denies possibility being pregnant.  PCP/sports medicine [facility]   Past Medical History:  Diagnosis Date   Acute on chronic systolic (congestive) heart failure ([place]) 04/29/2017   Acute pain of right shoulder 06/16/2017   AICD (automatic cardioverter/defibrillator) present    Anginal pain (HCC)    Anxiety    Arthritis    right shoulder    Asthma    CHF (congestive heart failure) ([place])    Chronic combined systolic and diastolic heart failure ([place]) 03/05/2014   Closed low lateral malleolus fracture 10/23/2013   Cystitis 10/21/2017   Depression    Depression with anxiety 01/20/2013   Diabetes mellitus without complication (HCC)    Diverticulosis    Dyspnea    comes and goes intermittently mostly with exertion    Dysrhythmia    Essential hypertension    Prev followed by [name]    Fibroid    age 28   Gallstones    Generalized abdominal cramping    History of cardiomyopathy    Hypertension    IBS (irritable bowel syndrome)    ICD (implantable  cardioverter-defibrillator), single, 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   Insomnia 05/07/2017   Labral tear of shoulder 04/04/2015    Injected 04/04/2015 Injected 12/03/2015    Migraine    "monthly" (08/03/2016)   Myofascial pain 06/16/2017   NICM (nonischemic cardiomyopathy) ([place]) 08/03/2016   Nonallopathic lesion of lumbosacral region 11/16/2016   Nonallopathic lesion of sacral region 11/16/2016   Nonallopathic lesion of thoracic region 08/20/2014   Nonspecific chest pain 04/28/2017   OSA (obstructive sleep apnea) 01/02/2013   NPSG 2009:  AHI 9/hr. CPAP intolerance >> "smothering" Good tolerance of auto device (optimal pressure 12-13 on download).  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   PVC (premature ventricular contraction) 06/23/2016   Seizures ([place])    "as a child" (08/03/2016)   Termination of pregnancy    due to cardiac risk    Past Surgical History:  Procedure Laterality Date   CARDIAC CATHETERIZATION N/A 11/11/2015   Procedure: Right/Left Heart Cath and Coronary Angiography;  Surgeon: Troy Sine, MD;  Location: [facility];  Service: Cardiovascular;  Laterality: N/A;   CARDIAC CATHETERIZATION  ~ 2015   CARDIAC DEFIBRILLATOR PLACEMENT  08/03/2016   CESAREAN SECTION  1999   COLONOSCOPY WITH PROPOFOL N/A  04/21/2016   Procedure: COLONOSCOPY WITH PROPOFOL;  Surgeon: Jerene Bears, MD;  Location: [facility];  Service: Gastroenterology;  Laterality: N/A;   ESOPHAGOGASTRODUODENOSCOPY (EGD) WITH PROPOFOL N/A 04/21/2016   Procedure: ESOPHAGOGASTRODUODENOSCOPY (EGD) WITH PROPOFOL;  Surgeon: Jerene Bears, MD;  Location: [facility];  Service: Gastroenterology;  Laterality: N/A;   FOOT FRACTURE SURGERY Right ~ 2003   FRACTURE SURGERY     ICD IMPLANT N/A 08/03/2016   Procedure: ICD Implant;  Surgeon: Deboraha Sprang, MD;  Location:  [facility];  Service: Cardiovascular;  Laterality: N/A;   LAPAROSCOPIC CHOLECYSTECTOMY  12/2006   LAPAROSCOPIC GASTRIC SLEEVE RESECTION     LAPAROSCOPY ABDOMEN DIAGNOSTIC  2008   "cut bile duct w/gallbladder OR; had to go in later & fix leak; hospitalized for 2 months"   LEFT HEART CATHETERIZATION WITH CORONARY ANGIOGRAM N/A 02/26/2014   Procedure: LEFT HEART CATHETERIZATION WITH CORONARY ANGIOGRAM;  Surgeon: Jettie Booze, MD;  Location: [facility];  Service: Cardiovascular;  Laterality: N/A;   OVARIAN CYST REMOVAL Right 1999   PVC ABLATION N/A 12/01/2017   Procedure: PVC ABLATION;  Surgeon: Evans Lance, MD;  Location: [facility];  Service: Cardiovascular;  Laterality: N/A;   RIGHT HEART CATH N/A 08/05/2017   Procedure: RIGHT HEART CATH;  Surgeon: Jolaine Artist, MD;  Location: [facility];  Service: Cardiovascular;  Laterality: N/A;   RIGHT HEART CATH N/A 11/16/2019   Procedure: RIGHT HEART CATH;  Surgeon: Jolaine Artist, MD;  Location: [facility];  Service: Cardiovascular;  Laterality: N/A;   TUBAL LIGATION  1999    Family History  Problem Relation Age of Onset   Emphysema Maternal Grandmother        smoked   Heart disease Maternal Grandmother 53       MI   Rheum arthritis Mother    Allergies Daughter    Colon cancer Neg Hx     Social History   Tobacco Use   Smoking status: Never   Smokeless tobacco: Never  Vaping Use   Vaping Use: Never used  Substance Use Topics   Alcohol use: No   Drug use: No    No current facility-administered medications for this encounter.  Current Outpatient Medications:    naproxen (NAPROSYN) 500 MG tablet, Take 1 tablet (500 mg total) by mouth 2 (two) times daily for 5 days., Disp: 10 tablet, Rfl: 0   acetaminophen (TYLENOL) 650 MG CR tablet, Take 650 mg by mouth every 8 (eight) hours as needed for pain (headache)., Disp: , Rfl:    albuterol (PROAIR HFA) 108 (90 Base) MCG/ACT inhaler, Inhale 1-2  puffs into the lungs every 6 (six) hours as needed for wheezing or shortness of breath. Follow-up appt is due must see provider for future refills, Disp: 18 g, Rfl: 0   amiodarone (PACERONE) 200 MG tablet, Take 1 tablet (200 mg total) by mouth daily., Disp: 90 tablet, Rfl: 3   budesonide-formoterol (SYMBICORT) 160-4.5 MCG/ACT inhaler, Inhale 2 puffs into the lungs 2 (two) times daily as needed. Follow-up appt is due must see provider for future refills, Disp: 10 g, Rfl: 0   CORLANOR 7.5 MG TABS tablet, TAKE 1 TABLET BY MOUTH TWICE DAILY WITH A MEAL, Disp: 60 tablet, Rfl: 0   dicyclomine (BENTYL) 20 MG tablet, Take 1 tablet (20 mg total) by mouth daily as needed for spasms., Disp: 90 tablet, Rfl: 3   empagliflozin (JARDIANCE) 10 MG TABS tablet, Take 1  tablet (10 mg total) by mouth daily., Disp: 30 tablet, Rfl: 3   fluticasone (FLONASE) 50 MCG/ACT nasal spray, Place 2 sprays into both nostrils daily as needed for allergies. Follow-up appt is due must see provider for future refills, Disp: 16 g, Rfl: 0   gabapentin (NEURONTIN) 300 MG capsule, Take 1 capsule (300 mg total) by mouth at bedtime as needed (back spasms)., Disp: 90 capsule, Rfl: 3   ipratropium-albuterol (DUONEB) 0.5-2.5 (3) MG/3ML SOLN, Take 3 mLs by nebulization every 6 (six) hours as needed., Disp: 360 mL, Rfl: 0   lidocaine (LIDODERM) 5 %, Place 1 patch onto the skin daily. Remove & Discard patch within 12 hours or as directed by MD, Disp: 90 patch, Rfl: 3   Magnesium Oxide 200 MG TABS, Take 1 tablet (200 mg total) by mouth daily., Disp: 90 tablet, Rfl: 3   metoprolol succinate (TOPROL-XL) 50 MG 24 hr tablet, Take 1 tablet (50 mg total) by mouth daily. Take with or immediately following a meal., Disp: 30 tablet, Rfl: 3   mexiletine (MEXITIL) 200 MG capsule, Take 1 capsule (200 mg total) by mouth 2 (two) times daily., Disp: 60 capsule, Rfl: 4   mirtazapine (REMERON) 15 MG tablet, Take 1 tablet (15 mg total) by mouth at bedtime., Disp: 30  tablet, Rfl: 2   Multiple Vitamins-Minerals (MULTIVITAMIN GUMMIES WOMENS PO), Take 2 tablets by mouth daily., Disp: , Rfl:    nitroGLYCERIN (NITROSTAT) 0.4 MG SL tablet, Place 1 tablet (0.4 mg total) under the tongue every 5 (five) minutes as needed for chest pain., Disp: 75 tablet, Rfl: 0   potassium chloride SA (KLOR-CON M) 20 MEQ tablet, Take 20 mEq by mouth daily., Disp: , Rfl:    sacubitril-valsartan (ENTRESTO) 97-103 MG, Take 1 tablet by mouth 2 (two) times daily., Disp: 60 tablet, Rfl: 3   spironolactone (ALDACTONE) 25 MG tablet, Take 1 tablet (25 mg total) by mouth at bedtime., Disp: 90 tablet, Rfl: 3   tiZANidine (ZANAFLEX) 2 MG tablet, TAKE 1 TABLET BY MOUTH AT BEDTIME, Disp: 30 tablet, Rfl: 0   torsemide (DEMADEX) 20 MG tablet, Take 20 mg by mouth daily., Disp: , Rfl:    Ubrogepant (UBRELVY) 100 MG TABS, Take 100 mg by mouth daily as needed (migraine)., Disp: 30 tablet, Rfl: 3   Vitamin D, Ergocalciferol, (DRISDOL) 1.25 MG (50000 UNIT) CAPS capsule, Take 1 capsule (50,000 Units total) by mouth every 7 (seven) days., Disp: 12 capsule, Rfl: 0  Allergies  Allergen Reactions   Vancomycin Other (See Comments)    "did something to my kidneys," PROGRESSED TO KIDNEY FAILURE!!   Aspirin Other (See Comments)    Wheezing, (Pt states that she just wheezes some when she takes aspirin by itself but she can take aspirin in a combination product).   Contrast Media [Iodinated Contrast Media] Other (See Comments)    Multiple CT contrast studies done over 2 weeks caused ARF   Cyclobenzaprine Other (See Comments)    Can not tolerate   Ciprofloxacin Itching and Rash   Farxiga [Dapagliflozin] Rash   Sulfa Antibiotics Itching and Rash     ROS  As noted in HPI.   Physical Exam  BP 118/79 (BP Location: Right Arm)   Pulse 66   Temp 98 F (36.7 C)   Resp 18   SpO2 98%   Constitutional: Well developed, well nourished, no acute distress Eyes:  EOMI, conjunctiva normal bilaterally HENT:  Normocephalic, atraumatic,mucus membranes moist Respiratory: Normal inspiratory effort Cardiovascular: Normal rate GI:  nondistended skin: No rash, skin intact Musculoskeletal: no deformities. R shoulder with ROM somewhat limited , Drop test  painful but negative,  clavicle NT , A/C joint NT , scapula NT, proximal humerus tender, trapezius   tender, shoulder joint tender, Motor strength normal, Sensation intact LT over deltoid region, distal NVI with hand having intact sensation and strength in the median, radial, and ulnar nerve distribution.   Pain with internal rotation, pain with external rotation, Positive tenderness in bicipital groove, positive empty can test,  negative liftoff test, pain, but no instability with abduction/external rotation. RP 2+  Neurologic: Alert & oriented x 3, no focal neuro deficits Psychiatric: Speech and behavior appropriate   ED Course   Medications - No data to display  Orders Placed This Encounter  Procedures   DG Shoulder Right    Standing Status:   Standing    Number of Occurrences:   1    Order Specific Question:   Reason for Exam (SYMPTOM  OR DIAGNOSIS REQUIRED)    Answer:   History labral tear, diffuse atraumatic shoulder pain, rule out any acute changes   Apply Sling and Swathe    Standing Status:   Standing    Number of Occurrences:   1    Order Specific Question:   Laterality    Answer:   Right    No results found. However, due to the size of the patient record, not all encounters were searched. Please check Results Review for a complete set of results. DG Shoulder Right  Result Date: 11/20/2021 CLINICAL DATA:  Diffuse right shoulder pain EXAM: RIGHT SHOULDER - 2+ VIEW COMPARISON:  07/10/2015 FINDINGS: Pulse generator noted in the right chest with lead extending up in the right neck. Faint calcification in the expected vicinity of the distal infraspinatus tendon noted on the AP projection, favoring calcific tendinopathy, not substantially  changed 07/10/2015. AC joint and glenohumeral alignment appear normal. Mild spurring of the AC joint. Mild subacromial spurring. IMPRESSION: 1. Stable mild subacromial and mild degenerative AC joint spurring. 2. 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 Electronically Signed   By: Van Clines M.D.   On: 11/20/2021 11:08    ED Clinical Impression  1. Acute pain of right shoulder      ED Assessment/Plan   Reviewed imaging independently.  Stable AC joint spurring.  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  No acute fracture.  See radiology report for full details.  will check shoulder x-ray due to history of labral tear.  Favor acute inflammation of the rotator cuff.  She states that she is allowed to have NSAIDs.  Will place in a sling, sent home with a short course of Aleve/Tylenol, ice or heat, whichever feels better, she is to try her prescription of Zanaflex.  She will call her sports medicine physician and arrange follow-up.   Discussed imaging, MDM, treatment plan, and plan for follow-up with patient. patient agrees with plan.   Meds ordered this encounter  Medications   naproxen (NAPROSYN) 500 MG tablet    Sig: Take 1 tablet (500 mg total) by mouth 2 (two) times daily for 5 days.    Dispense:  10 tablet    Refill:  0      *This clinic note was created using [employer]. Therefore, there may be occasional mistakes despite careful proofreading.  ?    Melynda Ripple, MD 11/21/21 [phone]

## 2021-11-20 NOTE — ED Triage Notes (Signed)
Patient presents to Urgent Care with complaints of R shoulder pain  since yesterday. 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 Pt reports bursitis in that shoulder before. Pt reports aleve and hot shower to help with pain.

## 2021-11-26 ENCOUNTER — Telehealth: Payer: Self-pay

## 2021-11-26 ENCOUNTER — Ambulatory Visit (INDEPENDENT_AMBULATORY_CARE_PROVIDER_SITE_OTHER): Payer: HMO

## 2021-11-26 DIAGNOSIS — Z95810 Presence of automatic (implantable) cardiac defibrillator: Secondary | ICD-10-CM

## 2021-11-26 DIAGNOSIS — I5042 Chronic combined systolic (congestive) and diastolic (congestive) heart failure: Secondary | ICD-10-CM

## 2021-11-26 NOTE — Telephone Encounter (Signed)
Remote ICM transmission received.  Attempted call to patient regarding ICM remote transmission and left detailed message per DPR.  Advised to return call for any fluid symptoms or questions. Next ICM remote transmission scheduled 12/02/2021.

## 2021-11-26 NOTE — Progress Notes (Signed)
EPIC Encounter for ICM Monitoring  Patient Name: [name] is a 46 y.o. female Date: 11/26/2021 Primary Care Physican: Hoyt Koch, MD Primary Cardiologist: [place] Electrophysiologist: Caryl Comes 11/19/2021 Weight: 214 lbs                                                 Attempted call to patient and unable to reach.  Left detailed message per DPR regarding transmission. Transmission reviewed.    [facility] HF Index increased from 21 (6/26) to 33 (7/3) suggesting fluid level accumulation is possibly getting worse in last week.  Decrease in thoracic impedance with fluctuations in S3 & S1 (see 8 day trend).  Barostim implant 6/27.   Prescribed: Torsemide 20 mg Take 1 tablet  (20 mg total) by mouth daily.  Pt self adjusts Torsemide as needed. Potassium 20 mEq take 1 tablet daily Spironolactone 25 mg take 1 tablet (25 mg total) at bedtime.    Labs: 09/29/2021 Creatinine 0.67, BUN 7,   Potassium 3.9, Sodium 137, GFR >60 06/24/2021 Creatinine 0.83, BUN 9,   Potassium 3.6, Sodium 139 A complete set of results can be found in Results Review.   Recommendations:  Left voice mail with ICM number and encouraged to call if experiencing any fluid symptoms.   Follow-up plan: ICM clinic phone appointment on 12/02/2021 to recheck fluid levels.  91 day device clinic remote transmission 12/17/2021.              EP/Cardiology next office visit:   01/05/2022 with Oda Kilts, [place].  01/01/2022 with HF clinic.           Copy of ICM check sent to Dr. Caryl Comes.  Will send copy to Dr Haroldine Laws for review if patient is reached.   3 Month Trend    8 Day Data Trend          [name], RN 11/26/2021 12:54 PM

## 2021-12-03 ENCOUNTER — Telehealth: Payer: Self-pay

## 2021-12-03 ENCOUNTER — Ambulatory Visit (INDEPENDENT_AMBULATORY_CARE_PROVIDER_SITE_OTHER): Payer: HMO

## 2021-12-03 DIAGNOSIS — I5042 Chronic combined systolic (congestive) and diastolic (congestive) heart failure: Secondary | ICD-10-CM

## 2021-12-03 DIAGNOSIS — Z95810 Presence of automatic (implantable) cardiac defibrillator: Secondary | ICD-10-CM

## 2021-12-03 NOTE — Telephone Encounter (Signed)
Remote ICM transmission received.  Attempted call to patient regarding ICM remote transmission and left detailed message per DPR to return call.   

## 2021-12-03 NOTE — Progress Notes (Signed)
EPIC Encounter for ICM Monitoring  Patient Name: [name] is a 46 y.o. female Date: 12/03/2021 Primary Care Physican: Hoyt Koch, MD Primary Cardiologist: [place] Electrophysiologist: Caryl Comes 11/19/2021 Weight: 214 lbs                                                 Attempted call to patient and unable to reach.  Left detailed message per DPR regarding transmission. Transmission reviewed.    [facility]logic HF Index 31 suggesting possible fluid accumulation starting 6/24.  Decrease in thoracic impedance with fluctuations in S3 & S1 (see 8 day trend).  Barostim implant 6/27.   Prescribed: Torsemide 20 mg Take 1 tablet  (20 mg total) by mouth daily.  Pt self adjusts Torsemide as needed. Potassium 20 mEq take 1 tablet daily Spironolactone 25 mg take 1 tablet (25 mg total) at bedtime.    Labs: 09/29/2021 Creatinine 0.67, BUN 7,   Potassium 3.9, Sodium 137, GFR >60 06/24/2021 Creatinine 0.83, BUN 9,   Potassium 3.6, Sodium 139 A complete set of results can be found in Results Review.   Recommendations:  Left voice mail with ICM number and encouraged to call if experiencing any fluid symptoms.   Follow-up plan: ICM clinic phone appointment on 12/09/2021 to recheck fluid levels.  91 day device clinic remote transmission 12/17/2021.              EP/Cardiology next office visit:   01/05/2022 with Oda Kilts, [place].  01/01/2022 with HF clinic.           Copy of ICM check sent to Dr. Caryl Comes.  Will send copy to Dr Haroldine Laws for review if patient is reached.   3 Month Trend    8 Day Data Trend          [name], RN 12/03/2021 9:13 AM

## 2021-12-10 ENCOUNTER — Encounter: Payer: Self-pay | Admitting: Internal Medicine

## 2021-12-11 DIAGNOSIS — U071 COVID-19: Secondary | ICD-10-CM | POA: Diagnosis not present

## 2021-12-12 NOTE — Progress Notes (Signed)
No ICM remote transmission received for 12/09/2021 and next ICM transmission scheduled for 12/22/2021.

## 2021-12-13 ENCOUNTER — Encounter (HOSPITAL_COMMUNITY): Payer: Self-pay | Admitting: Emergency Medicine

## 2021-12-13 ENCOUNTER — Inpatient Hospital Stay (HOSPITAL_COMMUNITY)
Admission: EM | Admit: 2021-12-13 | Discharge: 2021-12-16 | DRG: 178 | Disposition: A | Discharge status: Home / Self Care | Payer: HMO | Attending: Internal Medicine | Admitting: Internal Medicine

## 2021-12-13 ENCOUNTER — Emergency Department (HOSPITAL_COMMUNITY): Payer: HMO

## 2021-12-13 ENCOUNTER — Encounter: Payer: Self-pay | Admitting: Hematology and Oncology

## 2021-12-13 DIAGNOSIS — J45901 Unspecified asthma with (acute) exacerbation: Secondary | ICD-10-CM | POA: Diagnosis present

## 2021-12-13 DIAGNOSIS — Z825 Family history of asthma and other chronic lower respiratory diseases: Secondary | ICD-10-CM

## 2021-12-13 DIAGNOSIS — R55 Syncope and collapse: Secondary | ICD-10-CM | POA: Diagnosis not present

## 2021-12-13 DIAGNOSIS — M1990 Unspecified osteoarthritis, unspecified site: Secondary | ICD-10-CM | POA: Diagnosis present

## 2021-12-13 DIAGNOSIS — Z8261 Family history of arthritis: Secondary | ICD-10-CM

## 2021-12-13 DIAGNOSIS — Z79899 Other long term (current) drug therapy: Secondary | ICD-10-CM

## 2021-12-13 DIAGNOSIS — R7989 Other specified abnormal findings of blood chemistry: Secondary | ICD-10-CM | POA: Diagnosis present

## 2021-12-13 DIAGNOSIS — I110 Hypertensive heart disease with heart failure: Secondary | ICD-10-CM | POA: Diagnosis present

## 2021-12-13 DIAGNOSIS — U071 COVID-19: Secondary | ICD-10-CM | POA: Diagnosis not present

## 2021-12-13 DIAGNOSIS — Z6834 Body mass index (BMI) 34.0-34.9, adult: Secondary | ICD-10-CM

## 2021-12-13 DIAGNOSIS — R0603 Acute respiratory distress: Secondary | ICD-10-CM | POA: Diagnosis present

## 2021-12-13 DIAGNOSIS — I493 Ventricular premature depolarization: Secondary | ICD-10-CM | POA: Diagnosis present

## 2021-12-13 DIAGNOSIS — R0789 Other chest pain: Secondary | ICD-10-CM | POA: Diagnosis not present

## 2021-12-13 DIAGNOSIS — R0609 Other forms of dyspnea: Secondary | ICD-10-CM | POA: Diagnosis present

## 2021-12-13 DIAGNOSIS — Z8249 Family history of ischemic heart disease and other diseases of the circulatory system: Secondary | ICD-10-CM | POA: Diagnosis not present

## 2021-12-13 DIAGNOSIS — G4733 Obstructive sleep apnea (adult) (pediatric): Secondary | ICD-10-CM | POA: Diagnosis present

## 2021-12-13 DIAGNOSIS — Z95810 Presence of automatic (implantable) cardiac defibrillator: Secondary | ICD-10-CM | POA: Diagnosis present

## 2021-12-13 DIAGNOSIS — Z20822 Contact with and (suspected) exposure to covid-19: Secondary | ICD-10-CM

## 2021-12-13 DIAGNOSIS — E669 Obesity, unspecified: Secondary | ICD-10-CM | POA: Diagnosis present

## 2021-12-13 DIAGNOSIS — I428 Other cardiomyopathies: Secondary | ICD-10-CM | POA: Diagnosis present

## 2021-12-13 DIAGNOSIS — Z9049 Acquired absence of other specified parts of digestive tract: Secondary | ICD-10-CM | POA: Diagnosis not present

## 2021-12-13 DIAGNOSIS — I5042 Chronic combined systolic (congestive) and diastolic (congestive) heart failure: Secondary | ICD-10-CM | POA: Diagnosis present

## 2021-12-13 DIAGNOSIS — Z8679 Personal history of other diseases of the circulatory system: Secondary | ICD-10-CM

## 2021-12-13 DIAGNOSIS — E873 Alkalosis: Secondary | ICD-10-CM | POA: Diagnosis present

## 2021-12-13 DIAGNOSIS — F32A Depression, unspecified: Secondary | ICD-10-CM | POA: Diagnosis present

## 2021-12-13 DIAGNOSIS — R008 Other abnormalities of heart beat: Secondary | ICD-10-CM | POA: Diagnosis present

## 2021-12-13 DIAGNOSIS — E6609 Other obesity due to excess calories: Secondary | ICD-10-CM | POA: Diagnosis not present

## 2021-12-13 DIAGNOSIS — E119 Type 2 diabetes mellitus without complications: Secondary | ICD-10-CM | POA: Diagnosis present

## 2021-12-13 DIAGNOSIS — R079 Chest pain, unspecified: Secondary | ICD-10-CM | POA: Diagnosis not present

## 2021-12-13 DIAGNOSIS — R7402 Elevation of levels of lactic acid dehydrogenase (LDH): Secondary | ICD-10-CM | POA: Diagnosis present

## 2021-12-13 DIAGNOSIS — G43909 Migraine, unspecified, not intractable, without status migrainosus: Secondary | ICD-10-CM | POA: Diagnosis present

## 2021-12-13 DIAGNOSIS — J4520 Mild intermittent asthma, uncomplicated: Secondary | ICD-10-CM | POA: Diagnosis not present

## 2021-12-13 DIAGNOSIS — J45909 Unspecified asthma, uncomplicated: Secondary | ICD-10-CM | POA: Diagnosis present

## 2021-12-13 DIAGNOSIS — E785 Hyperlipidemia, unspecified: Secondary | ICD-10-CM | POA: Diagnosis present

## 2021-12-13 DIAGNOSIS — E66811 Obesity, class 1: Secondary | ICD-10-CM | POA: Diagnosis present

## 2021-12-13 LAB — COMPREHENSIVE METABOLIC PANEL
ALT: 19 U/L (ref 0–44)
AST: 24 U/L (ref 15–41)
Albumin: 3.6 g/dL (ref 3.5–5.0)
Alkaline Phosphatase: 57 U/L (ref 38–126)
Anion gap: 9 (ref 5–15)
BUN: 6 mg/dL (ref 6–20)
CO2: 21 mmol/L — ABNORMAL LOW (ref 22–32)
Calcium: 9 mg/dL (ref 8.9–10.3)
Chloride: 107 mmol/L (ref 98–111)
Creatinine, Ser: 0.89 mg/dL (ref 0.44–1.00)
GFR, Estimated: >60 mL/min (ref >60)
Glucose, Bld: 96 mg/dL (ref 70–99)
Potassium: 3.9 mmol/L (ref 3.5–5.1)
Sodium: 137 mmol/L (ref 135–145)
Total Bilirubin: 0.3 mg/dL (ref 0.3–1.2)
Total Protein: 7.2 g/dL (ref 6.5–8.1)

## 2021-12-13 LAB — CBC WITH DIFFERENTIAL/PLATELET
Abs Immature Granulocytes: 0.02 10*3/uL (ref 0.00–0.07)
Basophils Absolute: 0 10*3/uL (ref 0.0–0.1)
Basophils Relative: 0 %
Eosinophils Absolute: 0 10*3/uL (ref 0.0–0.5)
Eosinophils Relative: 0 %
HCT: 40.1 % (ref 36.0–46.0)
Hemoglobin: 12.6 g/dL (ref 12.0–15.0)
Immature Granulocytes: 0 %
Lymphocytes Relative: 16 %
Lymphs Abs: 0.9 10*3/uL (ref 0.7–4.0)
MCH: 27.8 pg (ref 26.0–34.0)
MCHC: 31.4 g/dL (ref 30.0–36.0)
MCV: 88.3 fL (ref 80.0–100.0)
Monocytes Absolute: 0.7 10*3/uL (ref 0.1–1.0)
Monocytes Relative: 12 %
Neutro Abs: 4.1 10*3/uL (ref 1.7–7.7)
Neutrophils Relative %: 72 %
Platelets: 163 10*3/uL (ref 150–400)
RBC: 4.54 MIL/uL (ref 3.87–5.11)
RDW: 13.2 % (ref 11.5–15.5)
WBC: 5.7 10*3/uL (ref 4.0–10.5)
nRBC: 0 % (ref 0.0–0.2)

## 2021-12-13 LAB — I-STAT BETA HCG BLOOD, ED (MC, WL, AP ONLY): I-stat hCG, quantitative: <5 m[IU]/mL (ref <5)

## 2021-12-13 LAB — D-DIMER, QUANTITATIVE: D-Dimer, Quant: 1.2 ug/mL-FEU — ABNORMAL HIGH (ref 0.00–0.50)

## 2021-12-13 LAB — LACTATE DEHYDROGENASE: LDH: 199 U/L — ABNORMAL HIGH (ref 98–192)

## 2021-12-13 LAB — PROCALCITONIN: Procalcitonin: <0.1 ng/mL

## 2021-12-13 LAB — C-REACTIVE PROTEIN: CRP: 9.4 mg/dL — ABNORMAL HIGH (ref <1.0)

## 2021-12-13 LAB — LACTIC ACID, PLASMA: Lactic Acid, Venous: 1.2 mmol/L (ref 0.5–1.9)

## 2021-12-13 LAB — FIBRINOGEN: Fibrinogen: 460 mg/dL (ref 210–475)

## 2021-12-13 LAB — FERRITIN: Ferritin: 24 ng/mL (ref 11–307)

## 2021-12-13 LAB — TRIGLYCERIDES: Triglycerides: 84 mg/dL (ref <150)

## 2021-12-13 MED ORDER — SODIUM CHLORIDE 0.9 % IV BOLUS: 1000.0000 mL | Freq: Once | INTRAVENOUS | Status: DC

## 2021-12-13 MED ORDER — SODIUM CHLORIDE 0.9 % IV BOLUS
500.0000 mL | Freq: Once | INTRAVENOUS | Status: AC
  Administered 2021-12-14: 500 mL via INTRAVENOUS

## 2021-12-13 NOTE — H&P (Signed)
[name] YDX:[id number] DOB: September 04, 1975 DOA: 12/13/2021     PCP: Hoyt Koch, MD   Outpatient Specialists: * NONE CARDS: Dr. Daneen Schick Dr. Haroldine Laws    Patient arrived to ER on 12/13/21 at 1609 Referred by Attending Maudie Flakes, MD   Patient coming from:    home Lives  With family    Chief Complaint:   Chief Complaint  Patient presents with   Covid Positive    HPI: [name] is a 46 y.o. female with medical history significant of systolic CHF, asthma,   HTN, IBS sleep apnea, postpartum cardiomyopathy    Presented with shortness of breath chest pain Patient presents with chest pain shortness of breath Tested positive for COVID 3 days ago Endorses cough sore throat ear pain for the past 3 days Known history of CHF Reports that she continues to cough for past 24 hours a lot of dyspnea with exertion she has coughed so much that she Had a near syncope episode today  Reports CP feels like chest is on fire  Presumed to be COVID-positive given recent COVID positive test Lab Results  Component Value Date   [place] NEGATIVE 05/31/2021   [place] NEGATIVE 11/06/2020   [place] NEGATIVE 09/06/2020   [place] NEGATIVE 08/25/2020     Regarding pertinent Chronic problems:     HTN on Toprol   chronic CHF diastolic/systolic/ combined - last echo 05/2021 EF 25-30% Left ventricular diastolic parameters are indeterminate.  Entresto Aldactone torsemide   PVC - on amiodarone     obesity-   BMI Readings from Last 1 Encounters:  09/29/21 36.08 kg/m      Asthma -well   controlled on home inhalers/ nebs  Out of Symbicort    While in ER:    CXR -  NON acute   CTA chest - ordered  Following Medications were ordered in ER: Medications  sodium chloride 0.9 % bolus 500 mL (has no administration in time range)       ED Triage Vitals  Enc Vitals Group     BP 12/13/21 1619 (!) 136/100     Pulse Rate 12/13/21 1619 (!) 114     Resp  12/13/21 1619 16     Temp 12/13/21 1619 99.6 F (37.6 C)     Temp Source 12/13/21 1619 Oral     SpO2 12/13/21 1619 96 %     Weight --      Height --      Head Circumference --      Peak Flow --      Pain Score 12/13/21 1635 6     Pain Loc --      Pain Edu? --      Excl. in [place]? --   TMAX(24)@     _________________________________________ Significant initial  Findings: Abnormal Labs Reviewed  COMPREHENSIVE METABOLIC PANEL - Abnormal; Notable for the following components:      Result Value   CO2 21 (*)    All other components within normal limits  D-DIMER, QUANTITATIVE - Abnormal; Notable for the following components:   D-Dimer, Quant 1.20 (*)    All other components within normal limits  LACTATE DEHYDROGENASE - Abnormal; Notable for the following components:   LDH 199 (*)    All other components within normal limits  C-REACTIVE PROTEIN - Abnormal; Notable for the following components:   CRP 9.4 (*)    All other components within normal limits     _________________________ Troponin  ordered ECG: Ordered Personally reviewed and interpreted by me showing: HR : 122 Rhythm:   Sinus tachycardiaVentricular tachycardia, unsustained Nonspecific intraventricular conduction delay Probable anteroseptal infarct, recent Artifact in lead(s) I II III aVR aVL aVF V1 V5 V6 QTC 491   The recent clinical data is shown below. Vitals:   12/13/21 1619 12/13/21 1942 12/13/21 2315  BP: (!) 136/100 (!) 130/105 (!) 134/115  Pulse: (!) 114 (!) 49 97  Resp: 16 16 (!) 22  Temp: 99.6 F (37.6 C) 98 F (36.7 C)   TempSrc: Oral Oral   SpO2: 96% 96% 99%    WBC     Component Value Date/Time   WBC 5.7 12/13/2021 1639   LYMPHSABS 0.9 12/13/2021 1639   LYMPHSABS 2.6 07/30/2016 1544   MONOABS 0.7 12/13/2021 1639   EOSABS 0.0 12/13/2021 1639   EOSABS 0.0 07/30/2016 1544   BASOSABS 0.0 12/13/2021 1639   BASOSABS 0.0 07/30/2016 1544        Lactic Acid, Venous    Component Value Date/Time    LATICACIDVEN 1.2 12/13/2021 1639    Procalcitonin <0.1    _______________________________________________ Hospitalist was called for admission for   Suspected COVID-19 virus infection       The following Work up has been ordered so far:  Orders Placed This Encounter  Procedures   1-3 Lead EKG Interpretation   Blood Culture (routine x 2)   SARS Coronavirus 2 by RT PCR (hospital order, performed in [place] hospital lab) *cepheid single result test* Anterior Nasal Swab   DG Chest Port 1 View   Lactic acid, plasma   CBC WITH DIFFERENTIAL   Comprehensive metabolic panel   D-dimer, quantitative   Procalcitonin   Lactate dehydrogenase   Ferritin   Triglycerides   Fibrinogen   C-reactive protein   Magnesium   Diet NPO time specified   Cardiac monitoring   Insert peripheral IV x 2   Initiate Carrier Fluid Protocol   Place surgical mask on patient   Patient to wear surgical mask during transportation   Assess patient for ability to self-prone. If able (can move self in bed, ambulate) and stable (SpO2 and oxygen requirement):   RN/NT - Document specific oxygen requirements in [facility]   Notify EDP if new oxygen requirements escalates > 4L per minute [place]   RN to draw the following extra tubes:   Consult to hospitalist   Airborne and Contact precautions   Pulse oximetry, continuous   I-Stat beta hCG blood, ED   EKG 12-Lead   ED EKG 12-Lead     OTHER Significant initial  Findings:  labs showing:    Recent Labs  Lab 12/13/21 1639  NA 137  K 3.9  CO2 21*  GLUCOSE 96  BUN 6  CREATININE 0.89  CALCIUM 9.0    Cr  * stable,  Up from baseline see below Lab Results  Component Value Date   CREATININE 0.89 12/13/2021   CREATININE 1.01 (H) 10/08/2021   CREATININE 0.67 09/29/2021    Recent Labs  Lab 12/13/21 1639  AST 24  ALT 19  ALKPHOS 57  BILITOT 0.3  PROT 7.2  ALBUMIN 3.6   Lab Results  Component Value Date   CALCIUM 9.0 12/13/2021   PHOS 3.5 11/13/2019           Plt: Lab Results  Component Value Date   PLT 163 12/13/2021       COVID-19 Labs  Recent Labs    12/13/21 1639  DDIMER  1.20*  FERRITIN 24  LDH 199*  CRP 9.4*    Lab Results  Component Value Date   SARSCOV2NAA NEGATIVE 05/31/2021   SARSCOV2NAA NEGATIVE 11/06/2020   SARSCOV2NAA NEGATIVE 09/06/2020   [place] NEGATIVE 08/25/2020      Venous  Blood Gas result: ordred     Recent Labs  Lab 12/13/21 1639  WBC 5.7  NEUTROABS 4.1  HGB 12.6  HCT 40.1  MCV 88.3  PLT 163    HG/HCT  stable,       Component Value Date/Time   HGB 12.6 12/13/2021 1639   HGB 10.6 (L) 09/07/2019 1159   HCT 40.1 12/13/2021 1639   HCT 32.6 (L) 08/06/2020 1517   MCV 88.3 12/13/2021 1639   MCV 82 09/07/2019 1159         Cultures:    Component Value Date/Time   SDES  02/15/2020 1831    URINE, CLEAN CATCH Performed at [facility], [place] [address], [place], [place] [address]    SPECREQUEST  02/15/2020 1831    NONE Performed at [facility], [place] [address] [place], [place] [address]    CULT MULTIPLE SPECIES PRESENT, SUGGEST RECOLLECTION (A) 02/15/2020 1831   REPTSTATUS 02/17/2020 FINAL 02/15/2020 1831     Radiological Exams on Admission: DG Chest Port 1 View  Result Date: 12/13/2021 CLINICAL DATA:  COVID EXAM: PORTABLE CHEST 1 VIEW COMPARISON:  Chest x-ray 05/31/2021 FINDINGS: The heart and mediastinal contours are unchanged. No focal consolidation. No pulmonary edema. No pleural effusion. No pneumothorax. No acute osseous abnormality. Left chest wall cardiac defibrillator in grossly similar position. Right chest wall neural stimulator with lead coursing cranially, tip collimated off view. IMPRESSION: No active disease. Electronically Signed   By: Iven Finn M.D.   On: 12/13/2021 16:41   _______________________________________________________________________________________________________ Latest  Blood pressure (!) 134/115, pulse  97, temperature 98 F (36.7 C), temperature source Oral, resp. rate (!) 22, SpO2 99 %.   Vitals  labs and radiology finding personally reviewed  Review of Systems:    Pertinent positives include:   Fevers, chills, fatigue,  chest pain,  ear ache, nasal congestion, post nasal drip,Sore throat,  Constitutional:  No weight loss, night sweats,eight loss  HEENT:  No headaches, Difficulty swallowing,Tooth/dental problems, No sneezing, itching,  Cardio-vascular:  No  Orthopnea, PND, anasarca, dizziness, palpitations.no Bilateral lower extremity swelling  GI:  No heartburn, indigestion, abdominal pain, nausea, vomiting, diarrhea, change in bowel habits, loss of appetite, melena, blood in stool, hematemesis Resp:  no shortness of breath at rest. No dyspnea on exertion, No excess mucus, no productive cough, No non-productive cough, No coughing up of blood.No change in color of mucus.No wheezing. Skin:  no rash or lesions. No jaundice GU:  no dysuria, change in color of urine, no urgency or frequency. No straining to urinate.  No flank pain.  Musculoskeletal:  No joint pain or no joint swelling. No decreased range of motion. No back pain.  Psych:  No change in mood or affect. No depression or anxiety. No memory loss.  Neuro: no localizing neurological complaints, no tingling, no weakness, no double vision, no gait abnormality, no slurred speech, no confusion  All systems reviewed and apart from [place] all are negative _______________________________________________________________________________________________ Past Medical History:   Past Medical History:  Diagnosis Date   Acute on chronic systolic (congestive) heart failure ([place]) 04/29/2017   Acute pain of right shoulder 06/16/2017   AICD (automatic cardioverter/defibrillator) present    Anginal pain ([place])  Anxiety    Arthritis    right shoulder    Asthma    CHF (congestive heart failure) (HCC)    Chronic combined systolic and  diastolic heart failure (HCC) 03/05/2014   Closed low lateral malleolus fracture 10/23/2013   Cystitis 10/21/2017   Depression    Depression with anxiety 01/20/2013   Diabetes mellitus without complication (HCC)    Diverticulosis    Dyspnea    comes and goes intermittently mostly with exertion    Dysrhythmia    Essential hypertension    Prev followed by [name]    Fibroid    age 49   Gallstones    Generalized abdominal cramping    History of cardiomyopathy    Hypertension    IBS (irritable bowel syndrome)    ICD (implantable cardioverter-defibrillator), single, 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   Insomnia 05/07/2017   Labral tear of shoulder 04/04/2015    Injected 04/04/2015 Injected 12/03/2015    Migraine    "monthly" (08/03/2016)   Myofascial pain 06/16/2017   NICM (nonischemic cardiomyopathy) ([place]) 08/03/2016   Nonallopathic lesion of lumbosacral region 11/16/2016   Nonallopathic lesion of sacral region 11/16/2016   Nonallopathic lesion of thoracic region 08/20/2014   Nonspecific chest pain 04/28/2017   OSA (obstructive sleep apnea) 01/02/2013   NPSG 2009:  AHI 9/hr. CPAP intolerance >> "smothering" Good tolerance of auto device (optimal pressure 12-13 on download).  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   PVC (premature ventricular contraction) 06/23/2016   Seizures ([place])    "as a child" (08/03/2016)   Termination of pregnancy    due to cardiac risk      Past Surgical History:  Procedure Laterality Date   CARDIAC CATHETERIZATION N/A 11/11/2015   Procedure: Right/Left Heart Cath and Coronary Angiography;  Surgeon: Troy Sine, MD;  Location: [place] [facility];  Service: Cardiovascular;  Laterality: N/A;   CARDIAC CATHETERIZATION  ~ 2015   CARDIAC DEFIBRILLATOR PLACEMENT  08/03/2016   CESAREAN SECTION  1999    COLONOSCOPY WITH PROPOFOL N/A 04/21/2016   Procedure: COLONOSCOPY WITH PROPOFOL;  Surgeon: Jerene Bears, MD;  Location: [facility];  Service: Gastroenterology;  Laterality: N/A;   ESOPHAGOGASTRODUODENOSCOPY (EGD) WITH PROPOFOL N/A 04/21/2016   Procedure: ESOPHAGOGASTRODUODENOSCOPY (EGD) WITH PROPOFOL;  Surgeon: Jerene Bears, MD;  Location: [facility];  Service: Gastroenterology;  Laterality: N/A;   FOOT FRACTURE SURGERY Right ~ 2003   FRACTURE SURGERY     ICD IMPLANT N/A 08/03/2016   Procedure: ICD Implant;  Surgeon: Deboraha Sprang, MD;  Location: [place] [facility];  Service: Cardiovascular;  Laterality: N/A;   LAPAROSCOPIC CHOLECYSTECTOMY  12/2006   LAPAROSCOPIC GASTRIC SLEEVE RESECTION     LAPAROSCOPY ABDOMEN DIAGNOSTIC  2008   "cut bile duct w/gallbladder OR; had to go in later & fix leak; hospitalized for 2 months"   LEFT HEART CATHETERIZATION WITH CORONARY ANGIOGRAM N/A 02/26/2014   Procedure: LEFT HEART CATHETERIZATION WITH CORONARY ANGIOGRAM;  Surgeon: Jettie Booze, MD;  Location: [facility] C[facility] LAB;  Service: Cardiovascular;  Laterality: N/A;   OVARIAN CYST REMOVAL Right 1999   PVC ABLATION N/A 12/01/2017   Procedure: PVC ABLATION;  Surgeon: Evans Lance, MD;  Location: [place] [facility];  Service: Cardiovascular;  Laterality: N/A;   RIGHT HEART  CATH N/A 08/05/2017   Procedure: RIGHT HEART CATH;  Surgeon: Jolaine Artist, MD;  Location: [place] [facility];  Service: Cardiovascular;  Laterality: N/A;   RIGHT HEART CATH N/A 11/16/2019   Procedure: RIGHT HEART CATH;  Surgeon: Jolaine Artist, MD;  Location: [place] [facility];  Service: Cardiovascular;  Laterality: N/A;   TUBAL LIGATION  1999    Social History:  Ambulatory   independently       reports that she has never smoked. She has never used smokeless tobacco. She reports that she does not drink alcohol and does not use drugs.     Family History:    Family History  Problem Relation Age of Onset    Emphysema Maternal Grandmother        smoked   Heart disease Maternal Grandmother 43       MI   Rheum arthritis Mother    Allergies Daughter    Colon cancer Neg Hx    ______________________________________________________________________________________________ Allergies: Allergies  Allergen Reactions   Vancomycin Other (See Comments)    "did something to my kidneys," PROGRESSED TO KIDNEY FAILURE!!   Aspirin Other (See Comments)    Wheezing, (Pt states that she just wheezes some when she takes aspirin by itself but she can take aspirin in a combination product).   Contrast Media [Iodinated Contrast Media] Other (See Comments)    Multiple CT contrast studies done over 2 weeks caused ARF   Cyclobenzaprine Other (See Comments)    Can not tolerate   Ciprofloxacin Itching and Rash   Farxiga [Dapagliflozin] Rash   Sulfa Antibiotics Itching and Rash     Prior to Admission medications   Medication Sig Start Date End Date Taking? Authorizing Provider  acetaminophen (TYLENOL) 650 MG CR tablet Take 650 mg by mouth every 8 (eight) hours as needed for pain (headache).    [provider]  albuterol (PROAIR HFA) 108 (90 Base) MCG/ACT inhaler Inhale 1-2 puffs into the lungs every 6 (six) hours as needed for wheezing or shortness of breath. Follow-up appt is due must see provider for future refills 02/17/21   Hoyt Koch, MD  amiodarone (PACERONE) 200 MG tablet Take 1 tablet (200 mg total) by mouth daily. 02/04/21   Shirley Friar, PA-C  budesonide-formoterol (SYMBICORT) 160-4.5 MCG/ACT inhaler Inhale 2 puffs into the lungs 2 (two) times daily as needed. Follow-up appt is due must see provider for future refills 02/17/21   Hoyt Koch, MD  CORLANOR 7.5 MG TABS tablet TAKE 1 TABLET BY MOUTH TWICE DAILY WITH A MEAL 06/30/21   Bensimhon, Shaune Pascal, MD  dicyclomine (BENTYL) 20 MG tablet Take 1 tablet (20 mg total) by mouth daily as needed for spasms. 05/30/20   Hoyt Koch, MD  empagliflozin (JARDIANCE) 10 MG TABS tablet Take 1 tablet (10 mg total) by mouth daily. 11/20/21   Bensimhon, Shaune Pascal, MD  fluticasone (FLONASE) 50 MCG/ACT nasal spray Place 2 sprays into both nostrils daily as needed for allergies. Follow-up appt is due must see provider for future refills 02/17/21   Hoyt Koch, MD  gabapentin (NEURONTIN) 300 MG capsule Take 1 capsule (300 mg total) by mouth at bedtime as needed (back spasms). 05/30/20   Hoyt Koch, MD  ipratropium-albuterol (DUONEB) 0.5-2.5 (3) MG/3ML SOLN Take 3 mLs by nebulization every 6 (six) hours as needed. 11/17/19 11/29/21  Dahal, Marlowe Aschoff, MD  lidocaine (LIDODERM) 5 % Place 1 patch onto the skin daily. Remove & Discard  patch within 12 hours or as directed by MD 05/30/20   Hoyt Koch, MD  Magnesium Oxide 200 MG TABS Take 1 tablet (200 mg total) by mouth daily. 05/30/20   Bensimhon, Shaune Pascal, MD  metoprolol succinate (TOPROL-XL) 50 MG 24 hr tablet Take 1 tablet (50 mg total) by mouth daily. Take with or immediately following a meal. 11/20/21   Bensimhon, Shaune Pascal, MD  mexiletine (MEXITIL) 200 MG capsule Take 1 capsule (200 mg total) by mouth 2 (two) times daily. 06/24/21   Bensimhon, Shaune Pascal, MD  mirtazapine (REMERON) 15 MG tablet Take 1 tablet (15 mg total) by mouth at bedtime. 09/11/21   Hoyt Koch, MD  Multiple Vitamins-Minerals (MULTIVITAMIN GUMMIES WOMENS PO) Take 2 tablets by mouth daily.    [provider]  nitroGLYCERIN (NITROSTAT) 0.4 MG SL tablet Place 1 tablet (0.4 mg total) under the tongue every 5 (five) minutes as needed for chest pain. 06/20/21   Bensimhon, Shaune Pascal, MD  potassium chloride SA (KLOR-CON M) 20 MEQ tablet Take 20 mEq by mouth daily. 04/20/21   [provider]  sacubitril-valsartan (ENTRESTO) 97-103 MG Take 1 tablet by mouth 2 (two) times daily. 09/29/21   Bensimhon, Shaune Pascal, MD  spironolactone (ALDACTONE) 25 MG tablet Take 1 tablet (25 mg total) by  mouth at bedtime. 01/14/21   Bensimhon, Shaune Pascal, MD  tiZANidine (ZANAFLEX) 2 MG tablet TAKE 1 TABLET BY MOUTH AT BEDTIME 10/22/21   Lyndal Pulley, DO  torsemide (DEMADEX) 20 MG tablet Take 20 mg by mouth daily.    [provider]  Ubrogepant (UBRELVY) 100 MG TABS Take 100 mg by mouth daily as needed (migraine). 06/11/21   Hoyt Koch, MD  Vitamin D, Ergocalciferol, (DRISDOL) 1.25 MG (50000 UNIT) CAPS capsule Take 1 capsule (50,000 Units total) by mouth every 7 (seven) days. 05/30/20   Hoyt Koch, MD    ___________________________________________________________________________________________________ Physical Exam:    12/13/2021   11:15 PM 12/13/2021    7:42 PM 12/13/2021    4:19 PM  Vitals with BMI  Systolic 240 973 532  Diastolic 992 426 834  Pulse 97 49 114     1. General:  in No  Acute distress   well   -appearing 2. Psychological: Alert and   Oriented 3. Head/ENT:   Dry Mucous Membranes                          Head Non traumatic, neck supple                          Normal   Dentition 4. SKIN: decreased Skin turgor,  Skin clean Dry and intact no rash 5. Heart: Regular rate and rhythm no  Murmur, no Rub or gallop 6. Lungs , no wheezes or crackles   7. Abdomen: Soft,  non-tender, Non distended   obese  bowel sounds present 8. Lower extremities: no clubbing, cyanosis, no  edema 9. Neurologically Grossly intact, moving all 4 extremities equally   10. MSK: Normal range of motion    Chart has been reviewed  ______________________________________________________________________________________________   Assessment/Plan  46 y.o. female with medical history significant of systolic CHF, asthma,   HTN, IBS sleep apnea, postpartum cardiomyopathy    Admitted for   Suspected COVID-19 virus infection  Near syncope     Present on Admission:  COVID-19 virus infection  Asthma  Chronic combined systolic and  diastolic heart failure (HCC)  ICD (implantable  cardioverter-defibrillator), single, in situ  Obesity  Near syncope  Frequent PVCs  Chest pain     Asthma Chronic currently no wheezing to suggest acute asthma exacerbation but given history would be very careful monitoring  Chronic combined systolic and diastolic heart failure (HCC) Chronic continue Toprol Entresto spironolactone Restart amiodarone patient does have a lot of ectopy Monitor on telemetry initially significantly tachycardic now heart rates down to 90s Continue to torsemide No crackles to suggest acute CHF exacerbation  COVID-19 virus infection COVID test pending but tested positive at home and symptomatic. Given risk factors initiate treatment No hypoxia or infiltrates on chest x-ray For now treat with [place] Given tachycardia increased work of breathing elevated D-dimer obtain CTA CRP slightly elevated But  procalcitonin less than 0.1 doubt bacterial infection   ICD (implantable cardioverter-defibrillator), single, in situ Chronic stable  Obesity Chronic will need to follow-up as an outpatient for nutritional consult  Near syncope Obtain echo Troponin Likely in the setting of severe coughing frequent PVC s  Frequent PVCs continue amiodarone Admit to progressive  check electrolytes and replace as needed Pt is sp AICD If severe or AICD fires will need Cardiology consult     Chest pain clinically seems consistent with TRACHEITIS Given cardiac hx will obtain echo Cycle ce   Other plan as per orders.  DVT prophylaxis:   Lovenox      Code Status:    Code Status: Prior FULL CODE   as per patient  I had personally discussed CODE STATUS with patient      Family Communication:   Family not at  Bedside    Disposition Plan:    To home once workup is complete and patient is stable   Following barriers for discharge:                                                        Will need to be able to tolerate PO                         Vitals  improved Consults called: none  Admission status:  ED Disposition     ED Disposition  Admit   Condition  --   Comment  The patient appears reasonably stabilized for admission considering the current resources, flow, and capabilities available in the ED at this time, and I doubt any other [facility] requiring further screening and/or treatment in the ED prior to admission is  present.           Obs      Level of care   progressive tele indefinitely please discontinue once patient no longer qualifies COVID-19 Labs    Lab Results  Component Value Date   [place] 05/31/2021     Precautions: admitted as  covid positive Airborne and Contact precautions    Mutasim Tuckey 12/14/2021, 12:59 AM    [facility]     after 2 AM please page floor coverage PA If 7AM-7PM, please contact the day team taking care of the patient using [email or web address]   Patient was evaluated in the context of the global COVID-19 pandemic, which necessitated consideration that the patient might be at risk for infection with the SARS-CoV-2 virus that causes COVID-19. Institutional protocols  and algorithms that pertain to the evaluation of patients at risk for COVID-19 are in a state of rapid change based on information released by regulatory bodies including the CDC and federal and state organizations. These policies and algorithms were followed during the patient's care.

## 2021-12-13 NOTE — ED Provider Notes (Signed)
[facility] Emergency Department Provider Note MRN:  [id number]  Arrival date & time: 12/14/21     Chief Complaint   COVID History of Present Illness   [name] is a 46 y.o. year-old female with a history of CHF, AICD presenting to the ED with chief complaint of COVID.  Cough, sore throat, ear pain for the past 3 days.  Was exposed to a child who had COVID as she tested +3 days ago.  Has developed chest pain, shortness of breath, persistent cough over the past 24 hours.  A lot of dyspnea on exertion, coughing fits.  Had a near syncopal episode earlier today.  Review of Systems  A thorough review of systems was obtained and all systems are negative except as noted in the HPI and PMH.   [name] Health History    Past Medical History:  Diagnosis Date   Acute on chronic systolic (congestive) heart failure (HCC) 04/29/2017   Acute pain of right shoulder 06/16/2017   AICD (automatic cardioverter/defibrillator) present    Anginal pain (HCC)    Anxiety    Arthritis    right shoulder    Asthma    CHF (congestive heart failure) ([place])    Chronic combined systolic and diastolic heart failure ([place]) 03/05/2014   Closed low lateral malleolus fracture 10/23/2013   Cystitis 10/21/2017   Depression    Depression with anxiety 01/20/2013   Diabetes mellitus without complication (HCC)    Diverticulosis    Dyspnea    comes and goes intermittently mostly with exertion    Dysrhythmia    Essential hypertension    Prev followed by [name]    Fibroid    age 69   Gallstones    Generalized abdominal cramping    History of cardiomyopathy    Hypertension    IBS (irritable bowel syndrome)    ICD (implantable cardioverter-defibrillator), single, 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 CPAP intolerance >> "smothering" Good tolerance of auto device (optimal pressure 12-13 on download).  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   PVC (premature ventricular contraction) 06/23/2016   Seizures ([place])    "as a child" (08/03/2016)   Termination of pregnancy    due to cardiac risk    Past Surgical History:  Procedure Laterality Date   CARDIAC CATHETERIZATION N/A 11/11/2015   Procedure: Right/Left Heart Cath and Coronary Angiography;  Surgeon: Troy Sine, MD;  Location: [place] [facility];  Service: Cardiovascular;  Laterality: N/A;   CARDIAC CATHETERIZATION  ~ 2015   CARDIAC DEFIBRILLATOR PLACEMENT  08/03/2016   CESAREAN SECTION  1999   COLONOSCOPY WITH PROPOFOL N/A 04/21/2016   Procedure: COLONOSCOPY WITH PROPOFOL;  Surgeon: Jerene Bears, MD;  Location: [facility];  Service: Gastroenterology;  Laterality: N/A;   ESOPHAGOGASTRODUODENOSCOPY (EGD) WITH PROPOFOL  N/A 04/21/2016   Procedure: ESOPHAGOGASTRODUODENOSCOPY (EGD) WITH PROPOFOL;  Surgeon: Jerene Bears, MD;  Location: [facility];  Service: Gastroenterology;  Laterality: N/A;   FOOT FRACTURE SURGERY Right ~ 2003   FRACTURE SURGERY     ICD IMPLANT N/A 08/03/2016   Procedure: ICD Implant;  Surgeon: Deboraha Sprang, MD;  Location: [place] [facility];  Service: Cardiovascular;  Laterality: N/A;   LAPAROSCOPIC CHOLECYSTECTOMY  12/2006   LAPAROSCOPIC GASTRIC SLEEVE RESECTION     LAPAROSCOPY ABDOMEN DIAGNOSTIC  2008   "cut bile duct w/gallbladder OR; had to go in later & fix  leak; hospitalized for 2 months"   LEFT HEART CATHETERIZATION WITH CORONARY ANGIOGRAM N/A 02/26/2014   Procedure: LEFT HEART CATHETERIZATION WITH CORONARY ANGIOGRAM;  Surgeon: Jettie Booze, MD;  Location: [facility];  Service: Cardiovascular;  Laterality: N/A;   OVARIAN CYST REMOVAL Right 1999   PVC ABLATION N/A 12/01/2017   Procedure: PVC ABLATION;  Surgeon: Evans Lance, MD;  Location: [place] [facility];  Service: Cardiovascular;  Laterality: N/A;   RIGHT HEART CATH N/A 08/05/2017   Procedure: RIGHT HEART CATH;  Surgeon: Jolaine Artist, MD;  Location: [place] [facility];  Service: Cardiovascular;  Laterality: N/A;   RIGHT HEART CATH N/A 11/16/2019   Procedure: RIGHT HEART CATH;  Surgeon: Jolaine Artist, MD;  Location: [place] [facility];  Service: Cardiovascular;  Laterality: N/A;   TUBAL LIGATION  1999    Family History  Problem Relation Age of Onset   Emphysema Maternal Grandmother        smoked   Heart disease Maternal Grandmother 42       MI   Rheum arthritis Mother    Allergies Daughter    Colon cancer Neg Hx     Social History   Socioeconomic History   Marital status: Married    Spouse name: Not on file   Number of children: 1   Years of education: Not on file   Highest education level: Not on file  Occupational History   Occupation: stay at home mom  Tobacco Use   Smoking status: Never   Smokeless tobacco: Never  Vaping Use   Vaping Use: Never used  Substance and Sexual Activity   Alcohol use: No   Drug use: No   Sexual activity: Yes  Other Topics Concern   Not on file  Social History Narrative   Not on file   Social Determinants of Health   Financial Resource Strain: Not on file  Food Insecurity: No Food Insecurity (08/23/2019)   Hunger Vital Sign    Worried About Running Out of Food in the Last Year: Never true    Ran Out of Food in the Last Year: Never true  Transportation Needs: No Transportation Needs (08/23/2019)   PRAPARE -  Hydrologist (Medical): No    Lack of Transportation (Non-Medical): No  Physical Activity: Insufficiently Active (11/28/2019)   Exercise Vital Sign    Days of Exercise per Week: 2 days    Minutes of Exercise per Session: 40 min  Stress: Not on file  Social Connections: Socially Isolated (11/28/2019)   Social Connection and Isolation Panel [NHANES]    Frequency of Communication with Friends and Family: Twice a week    Frequency of Social Gatherings with Friends and Family: Never    Attends Religious Services: Never    Marine scientist or Organizations: No    Attends Club or  Organization Meetings: Never    Marital Status: Married  Human resources officer Violence: Not on file     Physical Exam   Vitals:   12/13/21 1942 12/13/21 2315  BP: (!) 130/105 (!) 134/115  Pulse: (!) 49 97  Resp: 16 (!) 22  Temp: 98 F (36.7 C)   SpO2: 96% 99%    CONSTITUTIONAL: Well-appearing, NAD NEURO/PSYCH:  Alert and oriented x 3, no focal deficits EYES:  eyes equal and reactive ENT/NECK:  no LAD, no JVD CARDIO: Tachycardic rate, well-perfused, normal S1 and S2 PULM:  CTAB no wheezing or rhonchi GI/GU:  non-distended, non-tender MSK/SPINE:  No gross deformities, no edema SKIN:  no rash, atraumatic   *Additional and/or pertinent findings included in MDM below  Diagnostic and Interventional Summary    EKG Interpretation  Date/Time:  Saturday December 13 2021 16:23:14 EDT Ventricular Rate:  128 PR Interval:  94 QRS Duration: 160 QT Interval:  382 QTC Calculation: 557 R Axis:   -49 Text Interpretation: Sinus tachycardia with short PR with frequent Premature ventricular complexes Right atrial enlargement Indeterminate axis Non-specific intra-ventricular conduction block Cannot rule out Septal infarct , age undetermined Possible Lateral infarct , age undetermined Cannot rule out Inferior infarct , age undetermined Abnormal ECG When compared with ECG of 29-Sep-2021 14:40,  PREVIOUS ECG IS PRESENT Confirmed by Wandra Arthurs [phone]) on 12/13/2021 10:23:06 PM       Labs Reviewed  COMPREHENSIVE METABOLIC PANEL - Abnormal; Notable for the following components:      Result Value   CO2 21 (*)    All other components within normal limits  D-DIMER, QUANTITATIVE - Abnormal; Notable for the following components:   D-Dimer, Quant 1.20 (*)    All other components within normal limits  LACTATE DEHYDROGENASE - Abnormal; Notable for the following components:   LDH 199 (*)    All other components within normal limits  C-REACTIVE PROTEIN - Abnormal; Notable for the following components:   CRP 9.4 (*)    All other components within normal limits  CULTURE, BLOOD (ROUTINE X 2)  CULTURE, BLOOD (ROUTINE X 2)  SARS CORONAVIRUS 2 BY RT PCR  LACTIC ACID, PLASMA  CBC WITH DIFFERENTIAL/PLATELET  PROCALCITONIN  FERRITIN  TRIGLYCERIDES  FIBRINOGEN  LACTIC ACID, PLASMA  MAGNESIUM  CK  PHOSPHORUS  TSH  PROTIME-INR  BLOOD GAS, VENOUS  I-STAT BETA HCG BLOOD, ED ([facility], [facility], [facility] ONLY)  TROPONIN I (HIGH SENSITIVITY)    DG Chest Port 1 View  Final Result    CT Angio Chest Pulmonary Embolism (PE) W or WO Contrast    (Results Pending)    Medications  sodium chloride 0.9 % bolus 500 mL (has no administration in time range)     Procedures  /  Critical Care .1-3 Lead EKG Interpretation  Performed by: Maudie Flakes, MD Authorized by: Maudie Flakes, MD     Interpretation: abnormal     ECG rate:  120s   ECG rate assessment: tachycardic     Rhythm: sinus rhythm     Ectopy: bigeminy and PVCs     Conduction: normal     ED Course and Medical Decision Making  Initial Impression and Ddx Tested positive for COVID-19 at home, worsening symptoms with chest pain, shortness of breath, dyspnea on exertion.  On exam patient is sitting with some mild tachypnea, tachycardia in the 120s.  With ambulation the heart rate goes up to the 130s, she becomes more dyspneic, persistent cough.   Patient recent echo  demonstrating an EF of 25%.  Given this clinical picture and her near syncopal episode earlier today, and her ectopy on the monitor, patient may benefit from hospitalist admission, will consult.  PE is considered but felt to be less likely and patient has a contrast allergy listed, apparently went into renal failure with contrast in the past and so CTA is felt to be more of a risk at this point with low yield.  Past medical/surgical history that increases complexity of ED encounter: CHF  Interpretation of Diagnostics I personally reviewed the EKG and my interpretation is as follows: Frequent PVCs, sinus rhythm  Labs reveal no significant blood count or electrolyte disturbance, CRP, LDH, D-dimer elevated, consistent with COVID-19 infection.  Patient Reassessment and Ultimate Disposition/Management     Case discussed with Dr. Roel Cluck on the hospitalist service who agrees with admission.  We we discussed the possibility of PE and feel that a CTA would be beneficial.  Patient is agreeable with CTA, the risk to her kidneys is felt to be quite low with a single low contrast dose.  Patient management required discussion with the following services or consulting groups:  Hospitalist Service  Complexity of Problems Addressed Acute illness or injury that poses threat of life of bodily function  Additional Data Reviewed and Analyzed Further history obtained from: Recent PCP notes and Recent Consult notes  Additional Factors Impacting ED Encounter Risk Consideration of hospitalization  [name], [place] [email or web address]  Final Clinical Impressions(s) / ED Diagnoses     ICD-10-CM   1. Suspected COVID-19 virus infection  Z20.822     2. Near syncope  R55     3. H/O heart failure  Z86.79     4. DOE (dyspnea on exertion)  R06.09       ED Discharge Orders     None        Discharge Instructions  Discussed with and Provided to Patient:   Discharge Instructions   None      Maudie Flakes, MD 12/13/21 2330    Maudie Flakes, MD 12/14/21 [phone]

## 2021-12-13 NOTE — Subjective & Objective (Signed)
Patient presents with chest pain shortness of breath Tested positive for COVID 3 days ago Endorses cough sore throat ear pain for the past 3 days Known history of CHF Reports that she continues to cough for past 24 hours a lot of dyspnea with exertion she has coughed so much that she Had a near syncope episode today

## 2021-12-13 NOTE — ED Provider Triage Note (Signed)
Emergency Medicine Provider Triage Evaluation Note  [name] , a 46 y.o. female  was evaluated in triage.  Pt complains of COVID. Symptoms started Wednesday night. Reports cough, chest pain, SHOB. History of minor asthma, also CHF (25-30% 06/24/21), on mucinex dm and APAP. Has defibrillator.   Review of Systems  Positive: CP, SHOB Negative: Vomiting, diarrhea  Physical Exam  BP (!) 136/100   Pulse (!) 114   Temp 99.6 F (37.6 C) (Oral)   Resp 16   SpO2 96%  Gen:   Awake, no distress   Resp:  Normal effort  MSK:   Moves extremities without difficulty  Other:    Medical Decision Making  Medically screening exam initiated at 4:21 PM.  Appropriate orders placed.  [name] was informed that the remainder of the evaluation will be completed by another provider, this initial triage assessment does not replace that evaluation, and the importance of remaining in the ED until their evaluation is complete.     Tacy Learn, PA-C 12/13/21 1622

## 2021-12-13 NOTE — ED Triage Notes (Signed)
Patient here with complaint of shortness of breath, chest pain, and being COVID positive since Wednesday this week.

## 2021-12-14 ENCOUNTER — Other Ambulatory Visit: Payer: Self-pay

## 2021-12-14 ENCOUNTER — Observation Stay (HOSPITAL_COMMUNITY): Payer: HMO

## 2021-12-14 ENCOUNTER — Encounter (HOSPITAL_COMMUNITY): Payer: Self-pay | Admitting: Internal Medicine

## 2021-12-14 DIAGNOSIS — E669 Obesity, unspecified: Secondary | ICD-10-CM | POA: Diagnosis present

## 2021-12-14 DIAGNOSIS — Z9049 Acquired absence of other specified parts of digestive tract: Secondary | ICD-10-CM | POA: Diagnosis not present

## 2021-12-14 DIAGNOSIS — Z79899 Other long term (current) drug therapy: Secondary | ICD-10-CM | POA: Diagnosis not present

## 2021-12-14 DIAGNOSIS — R008 Other abnormalities of heart beat: Secondary | ICD-10-CM | POA: Diagnosis present

## 2021-12-14 DIAGNOSIS — Z8249 Family history of ischemic heart disease and other diseases of the circulatory system: Secondary | ICD-10-CM | POA: Diagnosis not present

## 2021-12-14 DIAGNOSIS — E873 Alkalosis: Secondary | ICD-10-CM | POA: Diagnosis present

## 2021-12-14 DIAGNOSIS — R0603 Acute respiratory distress: Secondary | ICD-10-CM | POA: Diagnosis present

## 2021-12-14 DIAGNOSIS — I110 Hypertensive heart disease with heart failure: Secondary | ICD-10-CM | POA: Diagnosis present

## 2021-12-14 DIAGNOSIS — Z825 Family history of asthma and other chronic lower respiratory diseases: Secondary | ICD-10-CM | POA: Diagnosis not present

## 2021-12-14 DIAGNOSIS — J45901 Unspecified asthma with (acute) exacerbation: Secondary | ICD-10-CM | POA: Diagnosis present

## 2021-12-14 DIAGNOSIS — R55 Syncope and collapse: Secondary | ICD-10-CM | POA: Diagnosis present

## 2021-12-14 DIAGNOSIS — I5042 Chronic combined systolic (congestive) and diastolic (congestive) heart failure: Secondary | ICD-10-CM | POA: Diagnosis present

## 2021-12-14 DIAGNOSIS — I493 Ventricular premature depolarization: Secondary | ICD-10-CM | POA: Diagnosis present

## 2021-12-14 DIAGNOSIS — M1990 Unspecified osteoarthritis, unspecified site: Secondary | ICD-10-CM | POA: Diagnosis present

## 2021-12-14 DIAGNOSIS — F32A Depression, unspecified: Secondary | ICD-10-CM | POA: Diagnosis present

## 2021-12-14 DIAGNOSIS — E119 Type 2 diabetes mellitus without complications: Secondary | ICD-10-CM | POA: Diagnosis present

## 2021-12-14 DIAGNOSIS — R7989 Other specified abnormal findings of blood chemistry: Secondary | ICD-10-CM | POA: Diagnosis not present

## 2021-12-14 DIAGNOSIS — R079 Chest pain, unspecified: Secondary | ICD-10-CM

## 2021-12-14 DIAGNOSIS — Z6834 Body mass index (BMI) 34.0-34.9, adult: Secondary | ICD-10-CM | POA: Diagnosis not present

## 2021-12-14 DIAGNOSIS — R7402 Elevation of levels of lactic acid dehydrogenase (LDH): Secondary | ICD-10-CM | POA: Diagnosis present

## 2021-12-14 DIAGNOSIS — R0609 Other forms of dyspnea: Secondary | ICD-10-CM | POA: Diagnosis present

## 2021-12-14 DIAGNOSIS — U071 COVID-19: Secondary | ICD-10-CM | POA: Diagnosis present

## 2021-12-14 DIAGNOSIS — E785 Hyperlipidemia, unspecified: Secondary | ICD-10-CM | POA: Diagnosis present

## 2021-12-14 DIAGNOSIS — I428 Other cardiomyopathies: Secondary | ICD-10-CM | POA: Diagnosis present

## 2021-12-14 DIAGNOSIS — G4733 Obstructive sleep apnea (adult) (pediatric): Secondary | ICD-10-CM | POA: Diagnosis present

## 2021-12-14 DIAGNOSIS — G43909 Migraine, unspecified, not intractable, without status migrainosus: Secondary | ICD-10-CM | POA: Diagnosis present

## 2021-12-14 DIAGNOSIS — Z8261 Family history of arthritis: Secondary | ICD-10-CM | POA: Diagnosis not present

## 2021-12-14 LAB — ECHOCARDIOGRAM COMPLETE
AR max vel: 2.38 cm2
AV Peak grad: 5.4 mmHg
Ao pk vel: 1.16 m/s
Area-P 1/2: 5.7 cm2
Calc EF: 38.3 %
S' Lateral: 4.5 cm
Single Plane A2C EF: 38.7 %
Single Plane A4C EF: 37.2 %

## 2021-12-14 LAB — MAGNESIUM: Magnesium: 1.8 mg/dL (ref 1.7–2.4)

## 2021-12-14 LAB — I-STAT VENOUS BLOOD GAS, ED
Acid-Base Excess: 1 mmol/L (ref 0.0–2.0)
Bicarbonate: 24.7 mmol/L (ref 20.0–28.0)
Calcium, Ion: 1.1 mmol/L — ABNORMAL LOW (ref 1.15–1.40)
HCT: 37 % (ref 36.0–46.0)
Hemoglobin: 12.6 g/dL (ref 12.0–15.0)
O2 Saturation: 100 %
Potassium: 3.9 mmol/L (ref 3.5–5.1)
Sodium: 136 mmol/L (ref 135–145)
TCO2: 26 mmol/L (ref 22–32)
pCO2, Ven: 36.9 mmHg — ABNORMAL LOW (ref 44–60)
pH, Ven: 7.434 — ABNORMAL HIGH (ref 7.25–7.43)
pO2, Ven: 167 mmHg — ABNORMAL HIGH (ref 32–45)

## 2021-12-14 LAB — TSH: TSH: 3.403 u[IU]/mL (ref 0.350–4.500)

## 2021-12-14 LAB — CK: Total CK: 125 U/L (ref 38–234)

## 2021-12-14 LAB — PROTIME-INR
INR: 1 (ref 0.8–1.2)
Prothrombin Time: 13.3 seconds (ref 11.4–15.2)

## 2021-12-14 LAB — TROPONIN I (HIGH SENSITIVITY)
Troponin I (High Sensitivity): 11 ng/L (ref <18)
Troponin I (High Sensitivity): 11 ng/L (ref <18)

## 2021-12-14 LAB — PHOSPHORUS: Phosphorus: 3 mg/dL (ref 2.5–4.6)

## 2021-12-14 LAB — HIV ANTIBODY (ROUTINE TESTING W REFLEX): HIV Screen 4th Generation wRfx: NONREACTIVE

## 2021-12-14 LAB — SARS CORONAVIRUS 2 BY RT PCR: SARS Coronavirus 2 by RT PCR: POSITIVE — AB

## 2021-12-14 MED ORDER — SODIUM CHLORIDE 0.9% FLUSH
3.0000 mL | Freq: Two times a day (BID) | INTRAVENOUS | Status: DC
  Administered 2021-12-15: 3 mL via INTRAVENOUS

## 2021-12-14 MED ORDER — SPIRONOLACTONE 25 MG PO TABS
25.0000 mg | ORAL_TABLET | Freq: Every day | ORAL | Status: DC
  Administered 2021-12-14 – 2021-12-15 (×2): 25 mg via ORAL
  Filled 2021-12-14 (×2): qty 1

## 2021-12-14 MED ORDER — POTASSIUM CHLORIDE CRYS ER 20 MEQ PO TBCR
20.0000 meq | EXTENDED_RELEASE_TABLET | Freq: Every day | ORAL | Status: DC
  Administered 2021-12-14 – 2021-12-16 (×3): 20 meq via ORAL
  Filled 2021-12-14 (×3): qty 1

## 2021-12-14 MED ORDER — ALUM & MAG HYDROXIDE-SIMETH 200-200-20 MG/5ML PO SUSP: 15.0000 mL | Freq: Four times a day (QID) | ORAL | Status: DC | PRN

## 2021-12-14 MED ORDER — MAGNESIUM SULFATE IN D5W 1-5 GM/100ML-% IV SOLN
1.0000 g | Freq: Once | INTRAVENOUS | Status: AC
  Administered 2021-12-14: 1 g via INTRAVENOUS
  Filled 2021-12-14: qty 100

## 2021-12-14 MED ORDER — PANTOPRAZOLE SODIUM 40 MG IV SOLR
40.0000 mg | INTRAVENOUS | Status: DC
  Administered 2021-12-14 – 2021-12-16 (×3): 40 mg via INTRAVENOUS
  Filled 2021-12-14 (×3): qty 10

## 2021-12-14 MED ORDER — MIRTAZAPINE 15 MG PO TABS
15.0000 mg | ORAL_TABLET | Freq: Every day | ORAL | Status: DC
  Administered 2021-12-14: 15 mg via ORAL
  Filled 2021-12-14 (×2): qty 1

## 2021-12-14 MED ORDER — ONDANSETRON HCL 4 MG PO TABS: 4.0000 mg | ORAL_TABLET | Freq: Four times a day (QID) | ORAL | Status: DC | PRN

## 2021-12-14 MED ORDER — SODIUM CHLORIDE 0.9% FLUSH
3.0000 mL | Freq: Two times a day (BID) | INTRAVENOUS | Status: DC
  Administered 2021-12-14 – 2021-12-16 (×4): 3 mL via INTRAVENOUS

## 2021-12-14 MED ORDER — LIDOCAINE 5 % EX PTCH
1.0000 | MEDICATED_PATCH | CUTANEOUS | Status: DC
Start: 2021-12-14 — End: 2021-12-16
  Filled 2021-12-14 (×2): qty 1

## 2021-12-14 MED ORDER — SACUBITRIL-VALSARTAN 97-103 MG PO TABS
1.0000 | ORAL_TABLET | Freq: Two times a day (BID) | ORAL | Status: DC
  Administered 2021-12-14 – 2021-12-16 (×5): 1 via ORAL
  Filled 2021-12-14 (×7): qty 1

## 2021-12-14 MED ORDER — BENZONATATE 100 MG PO CAPS: 100.0000 mg | ORAL_CAPSULE | Freq: Three times a day (TID) | ORAL | Status: DC

## 2021-12-14 MED ORDER — GUAIFENESIN-DM 100-10 MG/5ML PO SYRP
10.0000 mL | ORAL_SOLUTION | ORAL | Status: DC | PRN
  Administered 2021-12-14 – 2021-12-15 (×2): 10 mL via ORAL
  Filled 2021-12-14 (×2): qty 10

## 2021-12-14 MED ORDER — TORSEMIDE 20 MG PO TABS
20.0000 mg | ORAL_TABLET | Freq: Every day | ORAL | Status: DC
  Administered 2021-12-14 – 2021-12-16 (×3): 20 mg via ORAL
  Filled 2021-12-14 (×4): qty 1

## 2021-12-14 MED ORDER — EMPAGLIFLOZIN 10 MG PO TABS
10.0000 mg | ORAL_TABLET | Freq: Every day | ORAL | Status: DC
  Administered 2021-12-14 – 2021-12-16 (×3): 10 mg via ORAL
  Filled 2021-12-14 (×3): qty 1

## 2021-12-14 MED ORDER — METOPROLOL SUCCINATE ER 50 MG PO TB24
50.0000 mg | ORAL_TABLET | Freq: Every day | ORAL | Status: DC
  Administered 2021-12-14 – 2021-12-15 (×2): 50 mg via ORAL
  Filled 2021-12-14: qty 1
  Filled 2021-12-14: qty 2
  Filled 2021-12-14: qty 1

## 2021-12-14 MED ORDER — MOMETASONE FURO-FORMOTEROL FUM 200-5 MCG/ACT IN AERO
2.0000 | INHALATION_SPRAY | Freq: Two times a day (BID) | RESPIRATORY_TRACT | Status: DC
  Administered 2021-12-14 – 2021-12-16 (×4): 2 via RESPIRATORY_TRACT
  Filled 2021-12-14: qty 8.8

## 2021-12-14 MED ORDER — SODIUM CHLORIDE 0.9% FLUSH: 3.0000 mL | INTRAVENOUS | Status: DC | PRN

## 2021-12-14 MED ORDER — MOLNUPIRAVIR EUA 200MG CAPSULE: 4.0000 | ORAL_CAPSULE | Freq: Two times a day (BID) | ORAL | Status: DC

## 2021-12-14 MED ORDER — ONDANSETRON HCL 4 MG/2ML IJ SOLN
4.0000 mg | Freq: Four times a day (QID) | INTRAMUSCULAR | Status: DC | PRN
  Administered 2021-12-14 – 2021-12-16 (×7): 4 mg via INTRAVENOUS
  Filled 2021-12-14 (×7): qty 2

## 2021-12-14 MED ORDER — IVABRADINE HCL 7.5 MG PO TABS
7.5000 mg | ORAL_TABLET | Freq: Two times a day (BID) | ORAL | Status: DC
  Administered 2021-12-14 – 2021-12-16 (×4): 7.5 mg via ORAL
  Filled 2021-12-14 (×5): qty 1

## 2021-12-14 MED ORDER — ENOXAPARIN SODIUM 40 MG/0.4ML IJ SOSY
40.0000 mg | PREFILLED_SYRINGE | INTRAMUSCULAR | Status: DC
  Administered 2021-12-14 – 2021-12-16 (×3): 40 mg via SUBCUTANEOUS
  Filled 2021-12-14 (×3): qty 0.4

## 2021-12-14 MED ORDER — IOHEXOL 350 MG/ML SOLN
80.0000 mL | Freq: Once | INTRAVENOUS | Status: AC | PRN
  Administered 2021-12-14: 80 mL via INTRAVENOUS

## 2021-12-14 MED ORDER — ACETAMINOPHEN 500 MG PO TABS
1000.0000 mg | ORAL_TABLET | Freq: Three times a day (TID) | ORAL | Status: DC
  Administered 2021-12-14 – 2021-12-16 (×7): 1000 mg via ORAL
  Filled 2021-12-14 (×7): qty 2

## 2021-12-14 MED ORDER — MOLNUPIRAVIR EUA 200MG CAPSULE
4.0000 | ORAL_CAPSULE | Freq: Two times a day (BID) | ORAL | Status: DC
  Administered 2021-12-14 – 2021-12-16 (×5): 800 mg via ORAL
  Filled 2021-12-14 (×2): qty 4

## 2021-12-14 MED ORDER — ACETAMINOPHEN 325 MG PO TABS
650.0000 mg | ORAL_TABLET | Freq: Four times a day (QID) | ORAL | Status: DC | PRN
  Administered 2021-12-14: 650 mg via ORAL
  Filled 2021-12-14: qty 2

## 2021-12-14 MED ORDER — HYDROCODONE-ACETAMINOPHEN 5-325 MG PO TABS
1.0000 | ORAL_TABLET | ORAL | Status: DC | PRN
  Administered 2021-12-14: 1 via ORAL
  Filled 2021-12-14: qty 1

## 2021-12-14 MED ORDER — MAGNESIUM OXIDE -MG SUPPLEMENT 400 (240 MG) MG PO TABS
200.0000 mg | ORAL_TABLET | Freq: Every day | ORAL | Status: DC
  Administered 2021-12-14 – 2021-12-16 (×3): 200 mg via ORAL
  Filled 2021-12-14 (×3): qty 1

## 2021-12-14 MED ORDER — BENZONATATE 100 MG PO CAPS
200.0000 mg | ORAL_CAPSULE | Freq: Three times a day (TID) | ORAL | Status: DC
  Administered 2021-12-14 – 2021-12-16 (×6): 200 mg via ORAL
  Filled 2021-12-14 (×6): qty 2

## 2021-12-14 MED ORDER — SODIUM CHLORIDE 0.9 % IV SOLN: 250.0000 mL | INTRAVENOUS | Status: DC | PRN

## 2021-12-14 MED ORDER — GABAPENTIN 300 MG PO CAPS: 300.0000 mg | ORAL_CAPSULE | Freq: Every evening | ORAL | Status: DC | PRN

## 2021-12-14 MED ORDER — OXYCODONE HCL 5 MG PO TABS
5.0000 mg | ORAL_TABLET | Freq: Four times a day (QID) | ORAL | Status: DC | PRN
  Administered 2021-12-14 – 2021-12-16 (×4): 5 mg via ORAL
  Filled 2021-12-14 (×5): qty 1

## 2021-12-14 MED ORDER — MEXILETINE HCL 200 MG PO CAPS
200.0000 mg | ORAL_CAPSULE | Freq: Two times a day (BID) | ORAL | Status: DC
  Administered 2021-12-14 – 2021-12-16 (×5): 200 mg via ORAL
  Filled 2021-12-14 (×6): qty 1

## 2021-12-14 MED ORDER — AMIODARONE HCL 200 MG PO TABS
200.0000 mg | ORAL_TABLET | Freq: Every day | ORAL | Status: DC
  Administered 2021-12-14 – 2021-12-15 (×2): 200 mg via ORAL
  Filled 2021-12-14 (×3): qty 1

## 2021-12-14 NOTE — Assessment & Plan Note (Signed)
Chronic-stable.

## 2021-12-14 NOTE — H&P (Incomplete)
[name] RXV:[id number] DOB: Nov 28, 1975 DOA: 12/13/2021     PCP: Hoyt Koch, MD   Outpatient Specialists: * NONE CARDS: * Dr. Daneen Schick NEphrology: *  Dr. NEurology *   Dr. Pulmonary *  Dr.  Oncology * Dr. Fabienne Bruns* Dr.  Sadie Haber, [facility]) No care team member to display Urology Dr. *  Patient arrived to ER on 12/13/21 at 1609 Referred by Attending Maudie Flakes, MD   Patient coming from:    home Lives alone,   *** With family    Chief Complaint:   Chief Complaint  Patient presents with  . Covid Positive    HPI: [name] is a 46 y.o. female with medical history significant of systolic CHF, asthma, DM2, HTN, IBS sleep apnea, postpartum cardiomyopathy    Presented with shortness of breath chest pain Patient presents with chest pain shortness of breath Tested positive for COVID 3 days ago Endorses cough sore throat ear pain for the past 3 days Known history of CHF Reports that she continues to cough for past 24 hours a lot of dyspnea with exertion she has coughed so much that she Had a near syncope episode today   Presumed to be COVID-positive given recent COVID positive test Lab Results  Component Value Date   [place] 05/31/2021   [place] NEGATIVE 11/06/2020   [place] NEGATIVE 09/06/2020   [place] NEGATIVE 08/25/2020     Regarding pertinent Chronic problems:     Hyperlipidemia - *on statins {statin:315258}  Lipid Panel     Component Value Date/Time   CHOL 160 11/09/2015 0526   TRIG 84 12/13/2021 1639   HDL 58 11/09/2015 0526   CHOLHDL 2.8 11/09/2015 0526   VLDL 14 11/09/2015 0526   LDLCALC 88 11/09/2015 0526    ***HTN on    chronic CHF diastolic/systolic/ combined - last echo 05/2021 EF 25-30% Left ventricular diastolic parameters are indeterminate.   *** CAD  - On Aspirin, statin, betablocker, Plavix                 - *followed by cardiology                - last cardiac cath  The ASCVD Risk score (Arnett  DK, et al., 2019) failed to calculate for the following reasons:   Cannot find a previous HDL lab   Cannot find a previous total cholesterol lab     DM 2 -  Lab Results  Component Value Date   HGBA1C 5.5 08/26/2020   ****on insulin, PO meds only, diet controlled      obesity-   BMI Readings from Last 1 Encounters:  09/29/21 36.08 kg/m     *** Asthma -well *** controlled on home inhalers/ nebs f                        ***last no prior***admission  ***                       No ***history of intubation  *** COPD - not **followed by pulmonology *** not  on baseline oxygen  *L,    *** OSA -on nocturnal oxygen, *CPAP, *noncompliant with CPAP  *** Hx of CVA - *with/out residual deficits on Aspirin 81 mg, 325, Plavix  ***A. Fib -  - CHA2DS2 vas score **** CHA2DS2/VAS Stroke Risk Points      N/A >= 2 Points: High Risk  1 -  1.99 Points: Medium Risk  0 Points: Low Risk    Last Change: N/A      This score determines the patient's risk of having a stroke if the  patient has atrial fibrillation.      This score is not applicable to this patient. Components are not  calculated.     current  on anticoagulation with ****Coumadin  ***Xarelto,* Eliquis,  *** Not on anticoagulation secondary to Risk of Falls, *** recurrent bleeding         -  Rate control:  Currently controlled with ***Toprolol,  *Metoprolol,* Diltiazem, *Coreg          - Rhythm control: *** amiodarone, *flecainide   While in ER:       Ordered  CT HEAD *** NON acute  CXR - ***NON acute  CTabd/pelvis - ***nonacute  CTA chest - ***nonacute, no PE, * no evidence of infiltrate  Following Medications were ordered in ER: Medications  sodium chloride 0.9 % bolus 500 mL (has no administration in time range)    _______________________________________________________ ER Provider Called:     DrMarland Kitchen  They Recommend admit to medicine *** Will see in AM  ***SEEN in ER   ED Triage Vitals  Enc Vitals Group     BP  12/13/21 1619 (!) 136/100     Pulse Rate 12/13/21 1619 (!) 114     Resp 12/13/21 1619 16     Temp 12/13/21 1619 99.6 F (37.6 C)     Temp Source 12/13/21 1619 Oral     SpO2 12/13/21 1619 96 %     Weight --      Height --      Head Circumference --      Peak Flow --      Pain Score 12/13/21 1635 6     Pain Loc --      Pain Edu? --      Excl. in [place]? --   TMAX(24)@     _________________________________________ Significant initial  Findings: Abnormal Labs Reviewed  COMPREHENSIVE METABOLIC PANEL - Abnormal; Notable for the following components:      Result Value   CO2 21 (*)    All other components within normal limits  D-DIMER, QUANTITATIVE - Abnormal; Notable for the following components:   D-Dimer, Quant 1.20 (*)    All other components within normal limits  LACTATE DEHYDROGENASE - Abnormal; Notable for the following components:   LDH 199 (*)    All other components within normal limits  C-REACTIVE PROTEIN - Abnormal; Notable for the following components:   CRP 9.4 (*)    All other components within normal limits     _________________________ Troponin ***ordered ECG: Ordered Personally reviewed and interpreted by me showing: HR : *** Rhythm: *NSR, Sinus tachycardia * A.fib. W RVR, RBBB, LBBB, Paced Ischemic changes*nonspecific changes, no evidence of ischemic changes QTC*   ____________________ This patient meets SIRS Criteria and may be septic. SIRS = Systemic Inflammatory Response Syndrome  Order a lactic acid level if needed AND/OR Initiate the sepsis protocol with the attached order set OR Click "Treating Associated Infection or Illness" if the patient is being treated for an infection that is a known cause of these abnormalities     The recent clinical data is shown below. Vitals:   12/13/21 1619 12/13/21 1942 12/13/21 2315  BP: (!) 136/100 (!) 130/105 (!) 134/115  Pulse: (!) 114 (!) 49 97  Resp: 16 16 (!) 22  Temp: 99.6 F (37.6 C)  98 F (36.7 C)    TempSrc: Oral Oral   SpO2: 96% 96% 99%        WBC     Component Value Date/Time   WBC 5.7 12/13/2021 1639   LYMPHSABS 0.9 12/13/2021 1639   LYMPHSABS 2.6 07/30/2016 1544   MONOABS 0.7 12/13/2021 1639   EOSABS 0.0 12/13/2021 1639   EOSABS 0.0 07/30/2016 1544   BASOSABS 0.0 12/13/2021 1639   BASOSABS 0.0 07/30/2016 1544        Lactic Acid, Venous    Component Value Date/Time   LATICACIDVEN 1.2 12/13/2021 1639     Procalcitonin *** Ordered Lactic Acid, Venous    Component Value Date/Time   LATICACIDVEN 1.2 12/13/2021 1639     Procalcitonin *** Ordered   UA *** no evidence of UTI  ***Pending ***not ordered   Urine analysis:    Component Value Date/Time   COLORURINE AMBER (A) 11/06/2020 1010   APPEARANCEUR HAZY (A) 11/06/2020 1010   LABSPEC 1.023 11/06/2020 1010   PHURINE 5.0 11/06/2020 1010   GLUCOSEU NEGATIVE 11/06/2020 1010   GLUCOSEU NEGATIVE 05/28/2016 1202   HGBUR NEGATIVE 11/06/2020 1010   BILIRUBINUR NEGATIVE 11/06/2020 1010   BILIRUBINUR Neg 02/10/2019 1608   KETONESUR NEGATIVE 11/06/2020 1010   PROTEINUR 30 (A) 11/06/2020 1010   UROBILINOGEN 0.2 02/10/2019 1608   UROBILINOGEN 0.2 05/28/2016 1202   NITRITE NEGATIVE 11/06/2020 1010   LEUKOCYTESUR NEGATIVE 11/06/2020 1010    Results for orders placed or performed during the hospital encounter of 05/31/21  Resp Panel by RT-PCR (Flu A&B, Covid) Nasopharyngeal Swab     Status: None   Collection Time: 05/31/21  7:15 PM   Specimen: Nasopharyngeal Swab; Nasopharyngeal(NP) swabs in vial transport medium  Result Value Ref Range Status   SARS Coronavirus 2 by RT PCR NEGATIVE NEGATIVE Final    Comment: (NOTE) SARS-CoV-2 target nucleic acids are NOT DETECTED.  The SARS-CoV-2 RNA is generally detectable in upper respiratory specimens during the acute phase of infection. The lowest concentration of SARS-CoV-2 viral copies this assay can detect is 138 copies/mL. A negative result does not preclude  SARS-Cov-2 infection and should not be used as the sole basis for treatment or other patient management decisions. A negative result may occur with  improper specimen collection/handling, submission of specimen other than nasopharyngeal swab, presence of viral mutation(s) within the areas targeted by this assay, and inadequate number of viral copies(<138 copies/mL). A negative result must be combined with clinical observations, patient history, and epidemiological information. The expected result is Negative.  Fact Sheet for Patients:  EntrepreneurPulse.com.au  Fact Sheet for Healthcare Providers:  IncredibleEmployment.be  This test is no t yet approved or cleared by the [place] FDA and  has been authorized for detection and/or diagnosis of SARS-CoV-2 by FDA under an Emergency Use Authorization (EUA). This EUA will remain  in effect (meaning this test can be used) for the duration of the COVID-19 declaration under Section 564(b)(1) of the Act, 21 U.S.C.section 360bbb-3(b)(1), unless the authorization is terminated  or revoked sooner.       Influenza A by PCR NEGATIVE NEGATIVE Final   Influenza B by PCR NEGATIVE NEGATIVE Final    Comment: (NOTE) The Xpert Xpress SARS-CoV-2/FLU/RSV plus assay is intended as an aid in the diagnosis of influenza from Nasopharyngeal swab specimens and should not be used as a sole basis for treatment. Nasal washings and aspirates are unacceptable for Xpert Xpress SARS-CoV-2/FLU/RSV testing.  Fact Sheet for Patients: EntrepreneurPulse.com.au  Fact Sheet for  Healthcare Providers: IncredibleEmployment.be  This test is not yet approved or cleared by the [place] and has been authorized for detection and/or diagnosis of SARS-CoV-2 by FDA under an Emergency Use Authorization (EUA). This EUA will remain in effect (meaning this test can be used) for the duration of  the COVID-19 declaration under Section 564(b)(1) of the Act, 21 U.S.C. section 360bbb-3(b)(1), unless the authorization is terminated or revoked.  Performed at [facility], [place] [address], [place], [place] [address]    *Note: Due to a large number of results and/or encounters for the requested time period, some results have not been displayed. A complete set of results can be found in Results Review.     _______________________________________________ Hospitalist was called for admission for *** Suspected COVID-19 virus infection ***  Near syncope ***  H/O heart failure ***  DOE (dyspnea on exertion) ***    The following Work up has been ordered so far:  Orders Placed This Encounter  Procedures  . 1-3 Lead EKG Interpretation  . Blood Culture (routine x 2)  . SARS Coronavirus 2 by RT PCR (hospital order, performed in [facility] hospital lab) *cepheid single result test* Anterior Nasal Swab  . DG Chest Port 1 View  . Lactic acid, plasma  . CBC WITH DIFFERENTIAL  . Comprehensive metabolic panel  . D-dimer, quantitative  . Procalcitonin  . Lactate dehydrogenase  . Ferritin  . Triglycerides  . Fibrinogen  . C-reactive protein  . Magnesium  . Diet NPO time specified  . Cardiac monitoring  . Insert peripheral IV x 2  . Initiate Carrier Fluid Protocol  . Place surgical mask on patient  . Patient to wear surgical mask during transportation  . Assess patient for ability to self-prone. If able (can move self in bed, ambulate) and stable (SpO2 and oxygen requirement):  . RN/NT - Document specific oxygen requirements in CHL  . Notify EDP if new oxygen requirements escalates > 4L per minute [place]  . RN to draw the following extra tubes:  . Consult to hospitalist  . Airborne and Contact precautions  . Pulse oximetry, continuous  . I-Stat beta hCG blood, ED  . EKG 12-Lead  . ED EKG 12-Lead     OTHER Significant initial  Findings:  labs showing:    Recent  Labs  Lab 12/13/21 1639  NA 137  K 3.9  CO2 21*  GLUCOSE 96  BUN 6  CREATININE 0.89  CALCIUM 9.0    Cr  * stable,  Up from baseline see below Lab Results  Component Value Date   CREATININE 0.89 12/13/2021   CREATININE 1.01 (H) 10/08/2021   CREATININE 0.67 09/29/2021    Recent Labs  Lab 12/13/21 1639  AST 24  ALT 19  ALKPHOS 57  BILITOT 0.3  PROT 7.2  ALBUMIN 3.6   Lab Results  Component Value Date   CALCIUM 9.0 12/13/2021   PHOS 3.5 11/13/2019          Plt: Lab Results  Component Value Date   PLT 163 12/13/2021       COVID-19 Labs  Recent Labs    12/13/21 1639  DDIMER 1.20*  FERRITIN 24  LDH 199*  CRP 9.4*    Lab Results  Component Value Date   SARSCOV2NAA NEGATIVE 05/31/2021   SARSCOV2NAA NEGATIVE 11/06/2020   [place] NEGATIVE 09/06/2020   [place] NEGATIVE 08/25/2020     Arterial ***Venous  Blood Gas result:  pH *** pCO2 ***; pO2 ***;     %  O2 Sat ***.  ABG    Component Value Date/Time   PHART 7.372 11/11/2015 1601   PCO2ART 43.0 11/11/2015 1601   PO2ART 60.0 (L) 11/11/2015 1601   HCO3 25.3 11/16/2019 1124   TCO2 27 11/16/2019 1124   ACIDBASEDEF 2.0 08/05/2017 0940   O2SAT 67.0 11/16/2019 1124         Recent Labs  Lab 12/13/21 1639  WBC 5.7  NEUTROABS 4.1  HGB 12.6  HCT 40.1  MCV 88.3  PLT 163    HG/HCT * stable,  Down *Up from baseline see below    Component Value Date/Time   HGB 12.6 12/13/2021 1639   HGB 10.6 (L) 09/07/2019 1159   HCT 40.1 12/13/2021 1639   HCT 32.6 (L) 08/06/2020 1517   MCV 88.3 12/13/2021 1639   MCV 82 09/07/2019 1159      No results for input(s): "LIPASE", "AMYLASE" in the last 168 hours. No results for input(s): "AMMONIA" in the last 168 hours.    Cardiac Panel (last 3 results) No results for input(s): "CKTOTAL", "CKMB", "TROPONINI", "RELINDX" in the last 72 hours.  .car BNP (last 3 results) Recent Labs    05/31/21 1915 06/24/21 1129  BNP 332.6* 237.4*      DM   labs:  HbA1C: No results for input(s): "HGBA1C" in the last 8760 hours.     CBG (last 3)  No results for input(s): "GLUCAP" in the last 72 hours.        Cultures:    Component Value Date/Time   SDES  02/15/2020 1831    URINE, CLEAN CATCH Performed at [facility], [place] [address] [place], [place] [address]    SPECREQUEST  02/15/2020 1831    NONE Performed at [facility], [place] [address] [place], [place] [address]    CULT MULTIPLE SPECIES PRESENT, SUGGEST RECOLLECTION (A) 02/15/2020 1831   REPTSTATUS 02/17/2020 FINAL 02/15/2020 1831     Radiological Exams on Admission: DG Chest Port 1 View  Result Date: 12/13/2021 CLINICAL DATA:  COVID EXAM: PORTABLE CHEST 1 VIEW COMPARISON:  Chest x-ray 05/31/2021 FINDINGS: The heart and mediastinal contours are unchanged. No focal consolidation. No pulmonary edema. No pleural effusion. No pneumothorax. No acute osseous abnormality. Left chest wall cardiac defibrillator in grossly similar position. Right chest wall neural stimulator with lead coursing cranially, tip collimated off view. IMPRESSION: No active disease. Electronically Signed   By: Iven Finn M.D.   On: 12/13/2021 16:41   _______________________________________________________________________________________________________ Latest  Blood pressure (!) 134/115, pulse 97, temperature 98 F (36.7 C), temperature source Oral, resp. rate (!) 22, SpO2 99 %.   Vitals  labs and radiology finding personally reviewed  Review of Systems:    Pertinent positives include: ***  Constitutional:  No weight loss, night sweats, Fevers, chills, fatigue, weight loss  HEENT:  No headaches, Difficulty swallowing,Tooth/dental problems,Sore throat,  No sneezing, itching, ear ache, nasal congestion, post nasal drip,  Cardio-vascular:  No chest pain, Orthopnea, PND, anasarca, dizziness, palpitations.no Bilateral lower extremity swelling  GI:  No heartburn,  indigestion, abdominal pain, nausea, vomiting, diarrhea, change in bowel habits, loss of appetite, melena, blood in stool, hematemesis Resp:  no shortness of breath at rest. No dyspnea on exertion, No excess mucus, no productive cough, No non-productive cough, No coughing up of blood.No change in color of mucus.No wheezing. Skin:  no rash or lesions. No jaundice GU:  no dysuria, change in color of urine, no urgency or frequency. No straining to urinate.  No flank pain.  Musculoskeletal:  No joint pain or no joint swelling. No decreased range of motion. No back pain.  Psych:  No change in mood or affect. No depression or anxiety. No memory loss.  Neuro: no localizing neurological complaints, no tingling, no weakness, no double vision, no gait abnormality, no slurred speech, no confusion  All systems reviewed and apart from [place] all are negative _______________________________________________________________________________________________ Past Medical History:   Past Medical History:  Diagnosis Date  . Acute on chronic systolic (congestive) heart failure ([place]) 04/29/2017  . Acute pain of right shoulder 06/16/2017  . AICD (automatic cardioverter/defibrillator) present   . Anginal pain ([place])   . Anxiety   . Arthritis    right shoulder   . Asthma   . CHF (congestive heart failure) ([place])   . Chronic combined systolic and diastolic heart failure ([place]) 03/05/2014  . Closed low lateral malleolus fracture 10/23/2013  . Cystitis 10/21/2017  . Depression   . Depression with anxiety 01/20/2013  . Diabetes mellitus without complication ([place])   . Diverticulosis   . Dyspnea    comes and goes intermittently mostly with exertion   . Dysrhythmia   . Essential hypertension    Prev followed by [facility]/ Cardiology   . Fibroid    age 28  . Gallstones   . Generalized abdominal cramping   . History of cardiomyopathy   . Hypertension   . IBS (irritable bowel syndrome)   . ICD (implantable  cardioverter-defibrillator), single, 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  . Insomnia 05/07/2017  . Labral tear of shoulder 04/04/2015    Injected 04/04/2015 Injected 12/03/2015   . Migraine    "monthly" (08/03/2016)  . Myofascial pain 06/16/2017  . NICM (nonischemic cardiomyopathy) ([place]) 08/03/2016  . Nonallopathic lesion of lumbosacral region 11/16/2016  . Nonallopathic lesion of sacral region 11/16/2016  . Nonallopathic lesion of thoracic region 08/20/2014  . Nonspecific chest pain 04/28/2017  . OSA (obstructive sleep apnea) 01/02/2013   NPSG 2009:  AHI 9/hr. CPAP intolerance >> "smothering" Good tolerance of auto device (optimal pressure 12-13 on download).  - referred to Dr Gwenette Greet    . OSA on CPAP   . Ovarian cyst    1999; surgically removed  . Patellofemoral syndrome of both knees 10/16/2016  . 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 PVC (premature ventricular contraction) 06/23/2016  . Seizures ([place])    "as a child" (08/03/2016)  . Termination of pregnancy    due to cardiac risk      Past Surgical History:  Procedure Laterality Date  . CARDIAC CATHETERIZATION N/A 11/11/2015   Procedure: Right/Left Heart Cath and Coronary Angiography;  Surgeon: Troy Sine, MD;  Location: [facility];  Service: Cardiovascular;  Laterality: N/A;  . CARDIAC CATHETERIZATION  ~ 2015  . CARDIAC DEFIBRILLATOR PLACEMENT  08/03/2016  . CESAREAN SECTION  1999  . COLONOSCOPY WITH PROPOFOL N/A 04/21/2016   Procedure: COLONOSCOPY WITH PROPOFOL;  Surgeon: Jerene Bears, MD;  Location: [facility];  Service: Gastroenterology;  Laterality: N/A;  . ESOPHAGOGASTRODUODENOSCOPY (EGD) WITH PROPOFOL N/A 04/21/2016   Procedure: ESOPHAGOGASTRODUODENOSCOPY (EGD) WITH PROPOFOL;  Surgeon: Jerene Bears, MD;  Location: [facility];  Service: Gastroenterology;  Laterality: N/A;  . FOOT FRACTURE SURGERY Right ~ 2003  . FRACTURE SURGERY    . ICD IMPLANT N/A 08/03/2016   Procedure: ICD Implant;  Surgeon:  Deboraha Sprang, MD;  Location: [facility];  Service: Cardiovascular;  Laterality: N/A;  .  LAPAROSCOPIC CHOLECYSTECTOMY  12/2006  . LAPAROSCOPIC GASTRIC SLEEVE RESECTION    . LAPAROSCOPY ABDOMEN DIAGNOSTIC  2008   "cut bile duct w/gallbladder OR; had to go in later & fix leak; hospitalized for 2 months"  . LEFT HEART CATHETERIZATION WITH CORONARY ANGIOGRAM N/A 02/26/2014   Procedure: LEFT HEART CATHETERIZATION WITH CORONARY ANGIOGRAM;  Surgeon: Jettie Booze, MD;  Location: [facility];  Service: Cardiovascular;  Laterality: N/A;  . OVARIAN CYST REMOVAL Right 1999  . PVC ABLATION N/A 12/01/2017   Procedure: PVC ABLATION;  Surgeon: Evans Lance, MD;  Location: [place];  Service: Cardiovascular;  Laterality: N/A;  . RIGHT HEART CATH N/A 08/05/2017   Procedure: RIGHT HEART CATH;  Surgeon: Jolaine Artist, MD;  Location: [facility];  Service: Cardiovascular;  Laterality: N/A;  . RIGHT HEART CATH N/A 11/16/2019   Procedure: RIGHT HEART CATH;  Surgeon: Jolaine Artist, MD;  Location: [place] [facility];  Service: Cardiovascular;  Laterality: N/A;  . TUBAL LIGATION  1999    Social History:  Ambulatory *** independently cane, walker  wheelchair bound, bed bound     reports that she has never smoked. She has never used smokeless tobacco. She reports that she does not drink alcohol and does not use drugs.     Family History: *** Family History  Problem Relation Age of Onset  . Emphysema Maternal Grandmother        smoked  . Heart disease Maternal Grandmother 5       MI  . Rheum arthritis Mother   . Allergies Daughter   . Colon cancer Neg Hx    ______________________________________________________________________________________________ Allergies: Allergies  Allergen Reactions  . Vancomycin Other (See Comments)    "did something to my kidneys," PROGRESSED TO KIDNEY FAILURE!!  Marland Kitchen Aspirin Other (See Comments)    Wheezing, (Pt states that she  just wheezes some when she takes aspirin by itself but she can take aspirin in a combination product).  . Contrast Media [Iodinated Contrast Media] Other (See Comments)    Multiple CT contrast studies done over 2 weeks caused ARF  . Cyclobenzaprine Other (See Comments)    Can not tolerate  . Ciprofloxacin Itching and Rash  . Farxiga [Dapagliflozin] Rash  . Sulfa Antibiotics Itching and Rash     Prior to Admission medications   Medication Sig Start Date End Date Taking? Authorizing Provider  acetaminophen (TYLENOL) 650 MG CR tablet Take 650 mg by mouth every 8 (eight) hours as needed for pain (headache).    [provider]  albuterol (PROAIR HFA) 108 (90 Base) MCG/ACT inhaler Inhale 1-2 puffs into the lungs every 6 (six) hours as needed for wheezing or shortness of breath. Follow-up appt is due must see provider for future refills 02/17/21   Hoyt Koch, MD  amiodarone (PACERONE) 200 MG tablet Take 1 tablet (200 mg total) by mouth daily. 02/04/21   Shirley Friar, PA-C  budesonide-formoterol (SYMBICORT) 160-4.5 MCG/ACT inhaler Inhale 2 puffs into the lungs 2 (two) times daily as needed. Follow-up appt is due must see provider for future refills 02/17/21   Hoyt Koch, MD  CORLANOR 7.5 MG TABS tablet TAKE 1 TABLET BY MOUTH TWICE DAILY WITH A MEAL 06/30/21   Bensimhon, Shaune Pascal, MD  dicyclomine (BENTYL) 20 MG tablet Take 1 tablet (20 mg total) by mouth daily as needed for spasms. 05/30/20   Hoyt Koch, MD  empagliflozin (JARDIANCE) 10 MG TABS tablet Take 1  tablet (10 mg total) by mouth daily. 11/20/21   Bensimhon, Shaune Pascal, MD  fluticasone (FLONASE) 50 MCG/ACT nasal spray Place 2 sprays into both nostrils daily as needed for allergies. Follow-up appt is due must see provider for future refills 02/17/21   Hoyt Koch, MD  gabapentin (NEURONTIN) 300 MG capsule Take 1 capsule (300 mg total) by mouth at bedtime as needed (back spasms). 05/30/20    Hoyt Koch, MD  ipratropium-albuterol (DUONEB) 0.5-2.5 (3) MG/3ML SOLN Take 3 mLs by nebulization every 6 (six) hours as needed. 11/17/19 11/29/21  Dahal, Marlowe Aschoff, MD  lidocaine (LIDODERM) 5 % Place 1 patch onto the skin daily. Remove & Discard patch within 12 hours or as directed by MD 05/30/20   Hoyt Koch, MD  Magnesium Oxide 200 MG TABS Take 1 tablet (200 mg total) by mouth daily. 05/30/20   Bensimhon, Shaune Pascal, MD  metoprolol succinate (TOPROL-XL) 50 MG 24 hr tablet Take 1 tablet (50 mg total) by mouth daily. Take with or immediately following a meal. 11/20/21   Bensimhon, Shaune Pascal, MD  mexiletine (MEXITIL) 200 MG capsule Take 1 capsule (200 mg total) by mouth 2 (two) times daily. 06/24/21   Bensimhon, Shaune Pascal, MD  mirtazapine (REMERON) 15 MG tablet Take 1 tablet (15 mg total) by mouth at bedtime. 09/11/21   Hoyt Koch, MD  Multiple Vitamins-Minerals (MULTIVITAMIN GUMMIES WOMENS PO) Take 2 tablets by mouth daily.    [provider]  nitroGLYCERIN (NITROSTAT) 0.4 MG SL tablet Place 1 tablet (0.4 mg total) under the tongue every 5 (five) minutes as needed for chest pain. 06/20/21   Bensimhon, Shaune Pascal, MD  potassium chloride SA (KLOR-CON M) 20 MEQ tablet Take 20 mEq by mouth daily. 04/20/21   [provider]  sacubitril-valsartan (ENTRESTO) 97-103 MG Take 1 tablet by mouth 2 (two) times daily. 09/29/21   Bensimhon, Shaune Pascal, MD  spironolactone (ALDACTONE) 25 MG tablet Take 1 tablet (25 mg total) by mouth at bedtime. 01/14/21   Bensimhon, Shaune Pascal, MD  tiZANidine (ZANAFLEX) 2 MG tablet TAKE 1 TABLET BY MOUTH AT BEDTIME 10/22/21   Lyndal Pulley, DO  torsemide (DEMADEX) 20 MG tablet Take 20 mg by mouth daily.    [provider]  Ubrogepant (UBRELVY) 100 MG TABS Take 100 mg by mouth daily as needed (migraine). 06/11/21   Hoyt Koch, MD  Vitamin D, Ergocalciferol, (DRISDOL) 1.25 MG (50000 UNIT) CAPS capsule Take 1 capsule (50,000 Units total) by  mouth every 7 (seven) days. 05/30/20   Hoyt Koch, MD    ___________________________________________________________________________________________________ Physical Exam:    12/13/2021   11:15 PM 12/13/2021    7:42 PM 12/13/2021    4:19 PM  Vitals with BMI  Systolic 124 580 998  Diastolic 338 250 539  Pulse 97 49 114     1. General:  in No ***Acute distress***increased work of breathing ***complaining of severe pain****agitated * Chronically ill *well *cachectic *toxic acutely ill -appearing 2. Psychological: Alert and *** Oriented 3. Head/ENT:   Moist *** Dry Mucous Membranes                          Head Non traumatic, neck supple                          Normal *** Poor Dentition 4. SKIN: normal *** decreased Skin turgor,  Skin clean Dry and intact  no rash 5. Heart: Regular rate and rhythm no*** Murmur, no Rub or gallop 6. Lungs: ***Clear to auscultation bilaterally, no wheezes or crackles   7. Abdomen: Soft, ***non-tender, Non distended *** obese ***bowel sounds present 8. Lower extremities: no clubbing, cyanosis, no ***edema 9. Neurologically Grossly intact, moving all 4 extremities equally *** strength 5 out of 5 in all 4 extremities cranial nerves II through XII intact 10. MSK: Normal range of motion    Chart has been reviewed  ______________________________________________________________________________________________  Assessment/Plan  ***  Admitted for *** Suspected COVID-19 virus infection ***  Near syncope ***  H/O heart failure ***  DOE (dyspnea on exertion) ***    Present on Admission: **None**     No problem-specific Assessment & Plan notes found for this encounter.    Other plan as per orders.  DVT prophylaxis:  SCD *** Lovenox       Code Status:    Code Status: Prior FULL CODE *** DNR/DNI ***comfort care as per patient ***family  I had personally discussed CODE STATUS with patient and family* I had spent *min discussing  goals of care and CODE STATUS    Family Communication:   Family not at  Bedside  plan of care was discussed on the phone with *** Son, Daughter, Wife, Husband, Sister, Brother , father, mother  Disposition Plan:   *** likely will need placement for rehabilitation                          Back to current facility when stable                            To home once workup is complete and patient is stable  ***Following barriers for discharge:                            Electrolytes corrected                               Anemia corrected                             Pain controlled with PO medications                               Afebrile, white count improving able to transition to PO antibiotics                             Will need to be able to tolerate PO                            Will likely need home health, home O2, set up                           Will need consultants to evaluate patient prior to discharge  ****EXPECT DC tomorrow                    ***Would benefit from PT/OT eval prior to DC  Ordered  Swallow eval - SLP ordered                   Diabetes care coordinator                   Transition of care consulted                   Nutrition    consulted                  Wound care  consulted                   Palliative care    consulted                   Behavioral health  consulted                    Consults called: ***    Admission status:  ED Disposition     ED Disposition  Admit   Condition  --   Comment  The patient appears reasonably stabilized for admission considering the current resources, flow, and capabilities available in the ED at this time, and I doubt any other [facility] requiring further screening and/or treatment in the ED prior to admission is  present.           Obs***  ***  inpatient     I Expect 2 midnight stay secondary to severity of patient's current illness need for inpatient interventions justified by the  following: ***hemodynamic instability despite optimal treatment (tachycardia *hypotension * tachypnea *hypoxia, hypercapnia) * Severe lab/radiological/exam abnormalities including:     and extensive comorbidities including: *substance abuse  *Chronic pain *DM2  * CHF * CAD  * COPD/asthma *Morbid Obesity * CKD *dementia *liver disease *history of stroke with residual deficits *  malignancy, * sickle cell disease  History of amputation Chronic anticoagulation  That are currently affecting medical management.   I expect  patient to be hospitalized for 2 midnights requiring inpatient medical care.  Patient is at high risk for adverse outcome (such as loss of life or disability) if not treated.  Indication for inpatient stay as follows:  Severe change from baseline regarding mental status Hemodynamic instability despite maximal medical therapy,  ongoing suicidal ideations,  severe pain requiring acute inpatient management,  inability to maintain oral hydration   persistent chest pain despite medical management Need for operative/procedural  intervention New or worsening hypoxia   Need for IV antibiotics, IV fluids, IV rate controling medications, IV antihypertensives, IV pain medications, IV anticoagulation, need for biPAP    Level of care   *** tele  For 12H 24H     medical floor       progressive tele indefinitely please discontinue once patient no longer qualifies COVID-19 Labs    Lab Results  Component Value Date   Petronila NEGATIVE 05/31/2021     Precautions: admitted as *** Covid Negative  ***asymptomatic screening protocol****PUI *** covid positive Airborne and Contact precautions ***If Covid PCR is negative  - please DC precautions - would need additional investigation given very high risk for false native test result    Critical***  Patient is critically ill due to  hemodynamic instability * respiratory failure *severe sepsis* ongoing chest pain*   They are at high risk for life/limb threatening clinical deterioration requiring frequent reassessment and modifications of care.  Services provided include examination of the patient, review of relevant ancillary tests,  prescription of lifesaving therapies, review of medications and prophylactic therapy.  Total critical care time excluding separately billable procedures: 60*  Minutes.    Darvis Croft 12/13/2021, 11:56 PM ***  [facility]     after 2 AM please page floor coverage PA If 7AM-7PM, please contact the day team taking care of the patient using [email or web address]   Patient was evaluated in the context of the global COVID-19 pandemic, which necessitated consideration that the patient might be at risk for infection with the SARS-CoV-2 virus that causes COVID-19. Institutional protocols and algorithms that pertain to the evaluation of patients at risk for COVID-19 are in a state of rapid change based on information released by regulatory bodies including the CDC and federal and state organizations. These policies and algorithms were followed during the patient's care.

## 2021-12-14 NOTE — Assessment & Plan Note (Signed)
clinically seems consistent with TRACHEITIS Given cardiac hx will obtain echo Cycle ce

## 2021-12-14 NOTE — Assessment & Plan Note (Signed)
Chronic will need to follow-up as an outpatient for nutritional consult

## 2021-12-14 NOTE — Assessment & Plan Note (Signed)
Chronic continue Toprol Entresto spironolactone Restart amiodarone patient does have a lot of ectopy Monitor on telemetry initially significantly tachycardic now heart rates down to 90s Continue to torsemide No crackles to suggest acute CHF exacerbation

## 2021-12-14 NOTE — Assessment & Plan Note (Signed)
Chronic currently no wheezing to suggest acute asthma exacerbation but given history would be very careful monitoring

## 2021-12-14 NOTE — Progress Notes (Signed)
PROGRESS NOTE  [name]  DOB: Feb 13, 1976  PCP: Hoyt Koch, MD IRW:[id number]  DOA: 12/13/2021  LOS: 0 days  Hospital Day: 2  Brief narrative: [name] is a 46 y.o. female with PMH significant for DM2, HTN, HLD, OSA on CPAP, chronic systolic and diastolic CHF, arrhythmia s/p AICD, anxiety/depression, arthritis. Patient presented to the ED on 7/22 with complaint of shortness of breath, chest pain. 7/19, patient tested positive for COVID after being exposed to a child with COVID.  She started having chest pain, shortness of breath, persistent cough and had a near syncopal episode earlier on the day of presentation.  In the ED, patient had temperature of 99.6, heart rate 114, blood pressure 136/100, breathing on room air Labs with CBC unremarkable, BMP unremarkable, LDH elevated to 199, CRP elevated to 9.4, lactic acid normal, procalcitonin level normal, D-dimer elevated 1.2 COVID PCR positive in ED Venous blood gas with alkalotic pH at 7.43, PCO2 37, bicarb 25 CT angio chest was negative for pulm embolism or any other acute cardiopulmonary disease Admitted to hospitalist service  Subjective: Patient was seen and examined this morning.  Pleasant middle-aged African-American female.  Lying down in bed.  Not on supplemental oxygen at rest.  Coughs and hurts on deep breathing. Chart reviewed. Hemodynamically stable  Assessment and plan: COVID-19 infection -Tested positive for COVID at home on 7/19.  Presented with worsening chest pain, shortness of breath, cough  -Chest imaging without any infiltrates.  Breathing on room air at rest  -On admission, she was started on 5-day course of Lagevrio.   -Her biggest symptoms are nonproductive cough that gives significant chest pain especially on deep breathing -Start scheduled benzonatate, as needed Mucinex, scheduled Tylenol, lidocaine patch and as needed Percocet -Continue monitor Recent Labs  Lab 12/13/21 1639  12/14/21 0001  SARSCOV2NAA  --  POSITIVE*  WBC 5.7  --   LATICACIDVEN 1.2  --   PROCALCITON <0.10  --   DDIMER 1.20*  --   FERRITIN 24  --   LDH 199*  --   CRP 9.4*  --   ALT 19  --    Acute asthma exacerbation Acute respiratory distress -Current respiratory distress precipitated by COVID-19 infection -VBG with alkalotic pH and low CO2. -Not requiring supplemental oxygen at rest.  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  Repeat echocardiogram ordered.   -PTA on Toprol 50 mg daily, Jardiance 10 mg daily, Entresto 97-103 mg twice daily, Aldactone 25 mg daily, torsemide 20 mg daily, -Currently heart failure remains compensated -Resume all CHF medicines as well as potassium and magnesium movement  History of arrhythmia s/p AICD Frequent PVCs -PTA on amiodarone 200 mg daily,, mexiletine 200 mg twice daily, Corlanor 7.5 mg daily -Continue to monitor in telemetry -Monitor and maintain electrolytes Recent Labs  Lab 12/13/21 1639 12/14/21 0000 12/14/21 0438  K 3.9  --  3.9  MG  --  1.8  --   PHOS  --  3.0  --    Near syncopal episode -Patient complains of near syncopal episode on the day of presentation probably because of persistent cough.  Blood pressure stable.  Look out for arrhythmia in telemetry.  Chest pain -Likely due to COVID infection/inflammation and persistent cough -Unlikely to be an ischemic cardiac event.  Troponins negative.  Echo to look for [facility] Recent Labs  12/14/21 0000 12/14/21 0350  CKTOTAL 125  --   TROPONINIHS 11 11    obesity  -There is no height or weight on file to calculate BMI. Patient has been advised to make an attempt to improve diet and exercise patterns to aid in weight loss.  OSA -CPAP nightly  Migraine -As needed pain meds  Goals of care   Code Status:  Full Code    Mobility: Encourage ambulation  Skin assessment:     Nutritional status:  There is no height or weight on file to calculate BMI.          Diet:  Diet Order             Diet Heart Room service appropriate? Yes; Fluid consistency: Thin  Diet effective now                   DVT prophylaxis:  enoxaparin (LOVENOX) injection 40 mg Start: 12/14/21 1000   Antimicrobials: None Fluid: None Consultants: None Family Communication: None at bedside  Status is: Observation  Continue in-hospital care because: Continues to be symptomatic from COVID infection.  Needs inpatient monitoring Level of care: Progressive   Dispo: The patient is from: Home              Anticipated d/c is to: Pending clinical course              Patient currently is not medically stable to d/c.   Difficult to place patient No     Infusions:   sodium chloride      Scheduled Meds:  acetaminophen  1,000 mg Oral TID   amiodarone  200 mg Oral Daily   benzonatate  200 mg Oral TID   empagliflozin  10 mg Oral Daily   enoxaparin (LOVENOX) injection  40 mg Subcutaneous Q24H   ivabradine  7.5 mg Oral BID WC   lidocaine  1 patch Transdermal Q24H   Magnesium Oxide -Mg Supplement  200 mg Oral Daily   metoprolol succinate  50 mg Oral Daily   mexiletine  200 mg Oral BID   mirtazapine  15 mg Oral QHS   molnupiravir EUA  4 capsule Oral BID   mometasone-formoterol  2 puff Inhalation BID   pantoprazole (PROTONIX) IV  40 mg Intravenous Q24H   potassium chloride SA  20 mEq Oral Daily   sacubitril-valsartan  1 tablet Oral BID   sodium chloride flush  3 mL Intravenous Q12H   sodium chloride flush  3 mL Intravenous Q12H   spironolactone  25 mg Oral QHS   torsemide  20 mg Oral Daily    PRN meds: sodium chloride, acetaminophen, alum & mag hydroxide-simeth, gabapentin, guaiFENesin-dextromethorphan, ondansetron **OR** ondansetron (ZOFRAN) IV, oxyCODONE, sodium chloride flush    Antimicrobials: Anti-infectives (From admission, onward)    Start     Dose/Rate Route Frequency Ordered Stop   12/14/21 0730  molnupiravir EUA (LAGEVRIO) capsule 800 mg        4 capsule Oral 2 times daily 12/14/21 0716 12/19/21 0959   12/14/21 0045  molnupiravir EUA (LAGEVRIO) capsule 800 mg  Status:  Discontinued        4 capsule Oral 2 times daily 12/14/21 0045 12/14/21 0716       Objective: Vitals:   12/14/21 0600 12/14/21 0800  BP: 110/75 135/85  Pulse: 91 95  Resp: (!) 22 20  Temp:    SpO2: 98% 100%   No intake or output data in the 24 hours ending 12/14/21  1024 There were no vitals filed for this visit. Weight change:  There is no height or weight on file to calculate BMI.   Physical Exam: General exam: Pleasant, middle-aged African-American female.  Not in pain at rest except when coughing Skin: No rashes, lesions or ulcers. HEENT: Atraumatic, normocephalic, no obvious bleeding Lungs: Diminished air entry in both bases.  Coughs violently on deep breathing CVS: Regular rate and rhythm, no murmur GI/Abd soft, nontender, nondistended, bowel present CNS: Alert, awake, oriented x3 Psychiatry: Mood appropriate Extremities: No pedal edema, no calf tenderness  Data Review: I have personally reviewed the laboratory data and studies available.  F/u labs ordered Unresulted Labs (From admission, onward)     Start     Ordered   12/15/21 0500  CBC with Differential/Platelet  Daily at 5am,   R      12/14/21 0045   12/15/21 0500  Comprehensive metabolic panel  Daily at 5am,   R      12/14/21 0045   12/15/21 0500  C-reactive protein  Daily at 5am,   R      12/14/21 0045   12/15/21 0500  Ferritin  Daily at 5am,   R      12/14/21 0045   12/14/21 0045  Expectorated Sputum Assessment w Gram Stain, Rflx to Resp Cult  Once,   R        12/14/21 0045   12/13/21 1624  Blood Culture (routine x 2)  BLOOD CULTURE X 2,   STAT      12/13/21 1624            Signed, Terrilee Croak, MD [facility] 12/14/2021

## 2021-12-14 NOTE — Assessment & Plan Note (Signed)
Obtain echo Troponin Likely in the setting of severe coughing frequent PVC s

## 2021-12-14 NOTE — Assessment & Plan Note (Addendum)
COVID test pending but tested positive at home and symptomatic. Given risk factors initiate treatment No hypoxia or infiltrates on chest x-ray For now treat with Lagervrio Given tachycardia increased work of breathing elevated D-dimer obtain CTA CRP slightly elevated But  procalcitonin less than 0.1 doubt bacterial infection

## 2021-12-14 NOTE — Assessment & Plan Note (Addendum)
continue amiodarone Admit to progressive  check electrolytes and replace as needed Pt is sp AICD If severe or AICD fires will need Cardiology consult

## 2021-12-14 NOTE — ED Notes (Signed)
ED TO INPATIENT HANDOFF REPORT  ED Nurse Name and Phone #: [phone]  S Name/Age/Gender [name] 46 y.o. female Room/Bed: 020C/020C  Code Status   Code Status: Full Code  Home/SNF/Other Home Patient oriented to: self, place, time, and situation Is this baseline? Yes   Triage Complete: Triage complete  Chief Complaint COVID-19 virus infection [U07.1]  Triage Note Patient here with complaint of shortness of breath, chest pain, and being COVID positive since Wednesday this week.   Allergies Allergies  Allergen Reactions   Vancomycin Other (See Comments)    "did something to my kidneys," PROGRESSED TO KIDNEY FAILURE!!   Aspirin Other (See Comments)    Wheezing, (Pt states that she just wheezes some when she takes aspirin by itself but she can take aspirin in a combination product).   Contrast Media [Iodinated Contrast Media] Other (See Comments)    Multiple CT contrast studies done over 2 weeks caused ARF   Cyclobenzaprine Other (See Comments)    Can not tolerate   Ciprofloxacin Itching and Rash   Farxiga [Dapagliflozin] Rash   Sulfa Antibiotics Itching and Rash    Level of Care/Admitting Diagnosis ED Disposition     ED Disposition  Admit   Condition  --   [place]: [place] [[id number]]  Level of Care: Progressive [102]  Admit to Progressive based on following criteria: CARDIOVASCULAR & THORACIC of moderate stability with acute coronary syndrome symptoms/low risk myocardial infarction/hypertensive urgency/arrhythmias/heart failure potentially compromising stability and stable post cardiovascular intervention patients.  May admit patient to [facility] or [facility] if equivalent level of care is available:: No  Covid Evaluation: Confirmed COVID Positive  Diagnosis: COVID-19 virus infection [[id number]]  Admitting Physician: Toy Baker [[id number]]  Attending Physician: Terrilee Croak [[id number]]  Certification:: I certify this  patient will need inpatient services for at least 2 midnights  Estimated Length of Stay: 2          B Medical/Surgery History Past Medical History:  Diagnosis Date   Acute on chronic systolic (congestive) heart failure ([place]) 04/29/2017   Acute pain of right shoulder 06/16/2017   AICD (automatic cardioverter/defibrillator) present    Anginal pain ([place])    Anxiety    Arthritis    right shoulder    Asthma    CHF (congestive heart failure) ([place])    Chronic combined systolic and diastolic heart failure ([place]) 03/05/2014   Closed low lateral malleolus fracture 10/23/2013   Cystitis 10/21/2017   Depression    Depression with anxiety 01/20/2013   Diabetes mellitus without complication ([place])    Diverticulosis    Dyspnea    comes and goes intermittently mostly with exertion    Dysrhythmia    Essential hypertension    Prev followed by [name]    Fibroid    age 45   Gallstones    Generalized abdominal cramping    History of cardiomyopathy    Hypertension    IBS (irritable bowel syndrome)    ICD (implantable cardioverter-defibrillator), single, 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 CPAP intolerance >> "smothering" Good tolerance of auto device (optimal pressure 12-13 on download).  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   PVC (premature ventricular  contraction) 06/23/2016   Seizures ([place])    "as a child" (08/03/2016)   Termination of pregnancy    due to cardiac risk   Past Surgical History:  Procedure Laterality Date   CARDIAC CATHETERIZATION N/A 11/11/2015   Procedure: Right/Left Heart Cath and Coronary Angiography;  Surgeon: Troy Sine, MD;  Location: [facility];  Service: Cardiovascular;  Laterality: N/A;   CARDIAC CATHETERIZATION  ~ 2015   CARDIAC DEFIBRILLATOR PLACEMENT  08/03/2016   CESAREAN SECTION  1999   COLONOSCOPY WITH PROPOFOL N/A 04/21/2016   Procedure: COLONOSCOPY WITH PROPOFOL;  Surgeon: Jerene Bears, MD;  Location: [facility];  Service: Gastroenterology;  Laterality: N/A;   ESOPHAGOGASTRODUODENOSCOPY (EGD) WITH PROPOFOL N/A 04/21/2016   Procedure: ESOPHAGOGASTRODUODENOSCOPY (EGD) WITH PROPOFOL;  Surgeon: Jerene Bears, MD;  Location: [facility];  Service: Gastroenterology;  Laterality: N/A;   FOOT FRACTURE SURGERY Right ~ 2003   FRACTURE SURGERY     ICD IMPLANT N/A 08/03/2016   Procedure: ICD Implant;  Surgeon: Deboraha Sprang, MD;  Location: [facility];  Service: Cardiovascular;  Laterality: N/A;   LAPAROSCOPIC CHOLECYSTECTOMY  12/2006   LAPAROSCOPIC GASTRIC SLEEVE RESECTION     LAPAROSCOPY ABDOMEN DIAGNOSTIC  2008   "cut bile duct w/gallbladder OR; had to go in later & fix leak; hospitalized for 2 months"   LEFT HEART CATHETERIZATION WITH CORONARY ANGIOGRAM N/A 02/26/2014   Procedure: LEFT HEART CATHETERIZATION WITH CORONARY ANGIOGRAM;  Surgeon: Jettie Booze, MD;  Location: [facility] CATH LAB;  Service: Cardiovascular;  Laterality: N/A;   OVARIAN CYST REMOVAL Right 1999   PVC ABLATION N/A 12/01/2017   Procedure: PVC ABLATION;  Surgeon: Evans Lance, MD;  Location: [facility];  Service: Cardiovascular;  Laterality: N/A;   RIGHT HEART CATH N/A 08/05/2017   Procedure: RIGHT HEART CATH;  Surgeon: Jolaine Artist, MD;  Location: [facility];  Service: Cardiovascular;  Laterality:  N/A;   RIGHT HEART CATH N/A 11/16/2019   Procedure: RIGHT HEART CATH;  Surgeon: Jolaine Artist, MD;  Location: [facility];  Service: Cardiovascular;  Laterality: N/A;   TUBAL LIGATION  1999     A IV Location/Drains/Wounds Patient Lines/Drains/Airways Status     Active Line/Drains/Airways     Name Placement date Placement time Site Days   Peripheral IV 12/14/21 20 G Left Antecubital 12/14/21  0017  Antecubital  less than 1   Incision (Closed) 11/08/20 Neck Right 11/08/20  0925  -- 401   Incision (Closed) 11/08/20 Chest Right 11/08/20  0938  -- 401            Intake/Output Last 24 hours No intake or output data in the 24 hours ending 12/14/21 1834  Labs/Imaging Results for orders placed or performed during the hospital encounter of 12/13/21 (from the past 48 hour(s))  Lactic acid, plasma     Status: None   Collection Time: 12/13/21  4:39 PM  Result Value Ref Range   Lactic Acid, Venous 1.2 0.5 - 1.9 mmol/L    Comment: Performed at [facility], [address] [place], [place] [address]  Blood Culture (routine x 2)     Status: None (Preliminary result)   Collection Time: 12/13/21  4:39 PM   Specimen: BLOOD  Result Value Ref Range   Specimen Description BLOOD SITE NOT SPECIFIED    Special Requests      BOTTLES DRAWN AEROBIC AND ANAEROBIC Blood Culture adequate volume   Culture      NO GROWTH < 24 HOURS Performed at [facility], [place] [address] [place], [place] [address]    Report Status PENDING   CBC WITH DIFFERENTIAL     Status: None   Collection Time: 12/13/21  4:39 PM  Result Value Ref Range   WBC 5.7 4.0 - 10.5 K/uL   RBC 4.54 3.87 - 5.11 MIL/uL   Hemoglobin 12.6 12.0 - 15.0 g/dL   HCT 40.1 36.0 - 46.0 %   MCV 88.3 80.0 - 100.0 fL   MCH 27.8 26.0 - 34.0 pg   MCHC 31.4 30.0 - 36.0 g/dL   RDW 13.2 11.5 - 15.5 %   Platelets 163 150 - 400 K/uL   nRBC 0.0 0.0 - 0.2 %   Neutrophils Relative % 72 %   Neutro Abs 4.1 1.7 - 7.7 K/uL    Lymphocytes Relative 16 %   Lymphs Abs 0.9 0.7 - 4.0 K/uL   Monocytes Relative 12 %   Monocytes Absolute 0.7 0.1 - 1.0 K/uL   Eosinophils Relative 0 %   Eosinophils Absolute 0.0 0.0 - 0.5 K/uL   Basophils Relative 0 %   Basophils Absolute 0.0 0.0 - 0.1 K/uL   Immature Granulocytes 0 %   Abs Immature Granulocytes 0.02 0.00 - 0.07 K/uL    Comment: Performed at [facility], [address] [place], [place] [address]  Comprehensive metabolic panel     Status: Abnormal   Collection Time: 12/13/21  4:39 PM  Result Value Ref Range   Sodium 137 135 - 145 mmol/L   Potassium 3.9 3.5 - 5.1 mmol/L   Chloride 107 98 - 111 mmol/L   CO2 21 (L) 22 - 32 mmol/L   Glucose, Bld 96 70 - 99 mg/dL    Comment: Glucose reference range applies only to samples taken after fasting for at least 8 hours.   BUN 6 6 - 20 mg/dL   Creatinine, Ser 0.89 0.44 - 1.00 mg/dL   Calcium 9.0 8.9 - 10.3 mg/dL   Total Protein 7.2 6.5 - 8.1 g/dL   Albumin 3.6 3.5 - 5.0 g/dL   AST 24 15 - 41 U/L   ALT 19 0 - 44 U/L   Alkaline Phosphatase 57 38 - 126 U/L   Total Bilirubin 0.3 0.3 - 1.2 mg/dL   GFR, Estimated >60 >60 mL/min    Comment: (NOTE) Calculated using the CKD-EPI Creatinine Equation (2021)    Anion gap 9 5 - 15    Comment: Performed at [facility] [address], [place],  [address]  D-dimer, quantitative     Status: Abnormal   Collection Time: 12/13/21  4:39 PM  Result Value Ref Range   D-Dimer, Quant 1.20 (H) 0.00 - 0.50 ug/mL-FEU    Comment: (NOTE) At the manufacturer cut-off value of 0.5 g/mL FEU, this assay has a negative predictive value of 95-100%.This assay is intended for use in conjunction with a clinical pretest probability (PTP) assessment model to exclude pulmonary embolism (PE) and deep venous thrombosis (DVT) in outpatients suspected of PE  or DVT. Results should be correlated with clinical presentation. Performed at [facility], [place] [address], [place],  [place] [address]   Procalcitonin     Status: None   Collection Time: 12/13/21  4:39 PM  Result Value Ref Range   Procalcitonin <0.10 ng/mL    Comment:        Interpretation: PCT (Procalcitonin) <= 0.5 ng/mL: Systemic infection (sepsis) is not likely. Local bacterial infection is possible. (NOTE)       Sepsis PCT Algorithm           Lower Respiratory Tract                                      Infection PCT Algorithm    ----------------------------     ----------------------------         PCT < 0.25 ng/mL                PCT < 0.10 ng/mL          Strongly encourage             Strongly discourage   discontinuation of antibiotics    initiation of antibiotics    ----------------------------     -----------------------------       PCT 0.25 - 0.50 ng/mL            PCT 0.10 - 0.25 ng/mL               OR       >80% decrease in PCT            Discourage initiation of                                            antibiotics      Encourage discontinuation           of antibiotics    ----------------------------     -----------------------------         PCT >= 0.50 ng/mL              PCT 0.26 - 0.50 ng/mL               AND        <80% decrease in PCT             Encourage initiation of                                             antibiotics       Encourage continuation           of antibiotics    ----------------------------     -----------------------------        PCT >= 0.50 ng/mL                  PCT > 0.50 ng/mL               AND         increase in PCT                  Strongly encourage  initiation of antibiotics    Strongly encourage escalation           of antibiotics                                     -----------------------------                                           PCT <= 0.25 ng/mL                                                 OR                                        > 80% decrease in PCT                                      Discontinue  / Do not initiate                                             antibiotics  Performed at [facility], [address], [place], [place] [address]   Lactate dehydrogenase     Status: Abnormal   Collection Time: 12/13/21  4:39 PM  Result Value Ref Range   LDH 199 (H) 98 - 192 U/L    Comment: Performed at [facility], [place] [address] [place], [place] [address]  Ferritin     Status: None   Collection Time: 12/13/21  4:39 PM  Result Value Ref Range   Ferritin 24 11 - 307 ng/mL    Comment: Performed at [facility] [address] [place], [place] [address]  Triglycerides     Status: None   Collection Time: 12/13/21  4:39 PM  Result Value Ref Range   Triglycerides 84 <150 mg/dL    Comment: Performed at [facility] [address] [place], [place] [address]  Fibrinogen     Status: None   Collection Time: 12/13/21  4:39 PM  Result Value Ref Range   Fibrinogen 460 210 - 475 mg/dL    Comment: (NOTE) Fibrinogen results may be underestimated in patients receiving thrombolytic therapy. Performed at [facility], [place] [address] [place], [place] [address]   C-reactive protein     Status: Abnormal   Collection Time: 12/13/21  4:39 PM  Result Value Ref Range   CRP 9.4 (H) <1.0 mg/dL    Comment: Performed at [facility], [place] [address], [place], [place] [address]  I-Stat beta hCG blood, ED     Status: None   Collection Time: 12/13/21  5:22 PM  Result Value Ref Range   I-stat hCG, quantitative <5.0 <5 mIU/mL   Comment 3            Comment:   GEST. AGE  CONC.  (mIU/mL)   <=1 WEEK        5 - 50     2 WEEKS       50 - 500     3 WEEKS       100 - 10,000     4 WEEKS     1,000 - 30,000        FEMALE AND NON-PREGNANT FEMALE:     LESS THAN 5 mIU/mL   Magnesium     Status: None   Collection Time: 12/14/21 12:00 AM  Result Value Ref Range   Magnesium 1.8 1.7 - 2.4 mg/dL    Comment: Performed at [facility], [place] [address] [place],  [place] [address]  CK     Status: None   Collection Time: 12/14/21 12:00 AM  Result Value Ref Range   Total CK 125 38 - 234 U/L    Comment: Performed at [facility], [place] [address] [place], [place] [address]  Phosphorus     Status: None   Collection Time: 12/14/21 12:00 AM  Result Value Ref Range   Phosphorus 3.0 2.5 - 4.6 mg/dL    Comment: Performed at [facility], [place] [address] [place], [place] [address]  Troponin I (High Sensitivity)     Status: None   Collection Time: 12/14/21 12:00 AM  Result Value Ref Range   Troponin I (High Sensitivity) 11 <18 ng/L    Comment: (NOTE) Elevated high sensitivity troponin I (hsTnI) values and significant  changes across serial measurements may suggest ACS but many other  chronic and acute conditions are known to elevate hsTnI results.  Refer to the "Links" section for chest pain algorithms and additional  guidance. Performed at [facility], [place] [address] [place], [place] [address]   SARS Coronavirus 2 by RT PCR (hospital order, performed in [facility] hospital lab) *cepheid single result test* Anterior Nasal Swab     Status: Abnormal   Collection Time: 12/14/21 12:01 AM   Specimen: Anterior Nasal Swab  Result Value Ref Range   SARS Coronavirus 2 by RT PCR POSITIVE (A) NEGATIVE    Comment: (NOTE) SARS-CoV-2 target nucleic acids are DETECTED  SARS-CoV-2 RNA is generally detectable in upper respiratory specimens  during the acute phase of infection.  Positive results are indicative  of the presence of the identified virus, but do not rule out bacterial infection or co-infection with other pathogens not detected by the test.  Clinical correlation with patient history and  other diagnostic information is necessary to determine patient infection status.  The expected result is negative.  Fact Sheet for Patients:   https://www.patel.info/   Fact Sheet for Healthcare Providers:    https://hall.com/    This test is not yet approved or cleared by the [place] FDA and  has been authorized for detection and/or diagnosis of SARS-CoV-2 by FDA under an Emergency Use Authorization (EUA).  This EUA will remain in effect (meaning this test can be used) for the duration of  the COVID-19 declaration under Section 564(b)(1)  of the Act, 21 U.S.C. section 360-bbb-3(b)(1), unless the authorization is terminated or revoked sooner.   Performed at [facility], [place] [address], [place], [place] [address]   TSH     Status: None   Collection Time: 12/14/21  3:50 AM  Result Value Ref Range   TSH 3.403 0.350 - 4.500 uIU/mL    Comment: Performed by a 3rd Generation  assay with a functional sensitivity of <=0.01 uIU/mL. Performed at [facility], [place] [address] [place], [place] [address]   Protime-INR     Status: None   Collection Time: 12/14/21  3:50 AM  Result Value Ref Range   Prothrombin Time 13.3 11.4 - 15.2 seconds   INR 1.0 0.8 - 1.2    Comment: (NOTE) INR goal varies based on device and disease states. Performed at [facility], [place] [address] [place], [place] [address]   HIV Antibody (routine testing w rflx)     Status: None   Collection Time: 12/14/21  3:50 AM  Result Value Ref Range   HIV Screen 4th Generation wRfx Non Reactive Non Reactive    Comment: Performed at [facility], [place] [address] [place], [place] [address]  Troponin I (High Sensitivity)     Status: None   Collection Time: 12/14/21  3:50 AM  Result Value Ref Range   Troponin I (High Sensitivity) 11 <18 ng/L    Comment: (NOTE) Elevated high sensitivity troponin I (hsTnI) values and significant  changes across serial measurements may suggest ACS but many other  chronic and acute conditions are known to elevate hsTnI results.  Refer to the "Links" section for chest pain algorithms and additional  guidance. Performed at [facility],  [place] [address], [place], [place] [address]   I-Stat venous blood gas, ED     Status: Abnormal   Collection Time: 12/14/21  4:38 AM  Result Value Ref Range   pH, Ven 7.434 (H) 7.25 - 7.43   pCO2, Ven 36.9 (L) 44 - 60 mmHg   pO2, Ven 167 (H) 32 - 45 mmHg   Bicarbonate 24.7 20.0 - 28.0 mmol/L   TCO2 26 22 - 32 mmol/L   O2 Saturation 100 %   Acid-Base Excess 1.0 0.0 - 2.0 mmol/L   Sodium 136 135 - 145 mmol/L   Potassium 3.9 3.5 - 5.1 mmol/L   Calcium, Ion 1.10 (L) 1.15 - 1.40 mmol/L   HCT 37.0 36.0 - 46.0 %   Hemoglobin 12.6 12.0 - 15.0 g/dL   Sample type VENOUS    *Note: Due to a large number of results and/or encounters for the requested time period, some results have not been displayed. A complete set of results can be found in Results Review.   ECHOCARDIOGRAM COMPLETE  Result Date: 12/14/2021    ECHOCARDIOGRAM REPORT   Patient Name:   [name] Date of Exam: 12/14/2021 Medical Rec #:  [id number]      Height:       65.0 in Accession #:    [id number]     Weight:       216.8 lb Date of Birth:  April 16, 1976      BSA:          2.047 m Patient Age:    66 years       BP:           135/85 mmHg Patient Gender: F              HR:           91 bpm. Exam Location:  Inpatient Procedure: 2D Echo, Cardiac Doppler and Color Doppler Indications:    Chest pain  History:        Patient has prior history of Echocardiogram examinations. CHF;                 Risk Factors:Hypertension.  Sonographer:    Jyl Heinz Referring Phys: Bryan  1. Left ventricular ejection fraction, by estimation, is 25 to 30%. The left ventricle has severely decreased function. The left ventricle demonstrates global hypokinesis. The left ventricular internal cavity size was mildly dilated. There is mild concentric left ventricular hypertrophy. Left ventricular diastolic parameters are indeterminate.  2. Right ventricular systolic function is normal. The right ventricular size is normal. Tricuspid regurgitation  signal is inadequate for assessing PA pressure.  3. Left atrial size was mild to moderately dilated.  4. The mitral valve is normal in structure. Trivial mitral valve regurgitation. No evidence of mitral stenosis.  5. The aortic valve is tricuspid. Aortic valve regurgitation is not visualized. No aortic stenosis is present.  6. The inferior vena cava is normal in size with greater than 50% respiratory variability, suggesting right atrial pressure of 3 mmHg. Comparison(s): No significant change from prior study. FINDINGS  Left Ventricle: Left ventricular ejection fraction, by estimation, is 25 to 30%. The left ventricle has severely decreased function. The left ventricle demonstrates global hypokinesis. The left ventricular internal cavity size was mildly dilated. There is mild concentric left ventricular hypertrophy. Left ventricular diastolic parameters are indeterminate. Right Ventricle: The right ventricular size is normal. No increase in right ventricular wall thickness. Right ventricular systolic function is normal. Tricuspid regurgitation signal is inadequate for assessing PA pressure. Left Atrium: Left atrial size was mild to moderately dilated. Right Atrium: Right atrial size was normal in size. Pericardium: Trivial pericardial effusion is present. Mitral Valve: The mitral valve is normal in structure. Trivial mitral valve regurgitation. No evidence of mitral valve stenosis. Tricuspid Valve: The tricuspid valve is normal in structure. Tricuspid valve regurgitation is not demonstrated. No evidence of tricuspid stenosis. Aortic Valve: The aortic valve is tricuspid. Aortic valve regurgitation is not visualized. No aortic stenosis is present. Aortic valve peak gradient measures 5.4 mmHg. Pulmonic Valve: The pulmonic valve was normal in structure. Pulmonic valve regurgitation is not visualized. No evidence of pulmonic stenosis. Aorta: The aortic root and ascending aorta are structurally normal, with no evidence  of dilitation. Venous: The inferior vena cava is normal in size with greater than 50% respiratory variability, suggesting right atrial pressure of 3 mmHg. IAS/Shunts: No atrial level shunt detected by color flow Doppler. Additional Comments: A device lead is visualized in the right atrium and right ventricle.  LEFT VENTRICLE PLAX 2D LVIDd:         5.60 cm      Diastology LVIDs:         4.50 cm      LV e' medial:    4.03 cm/s LV PW:         1.40 cm      LV E/e' medial:  10.3 LV IVS:        1.00 cm      LV e' lateral:   7.40 cm/s LVOT diam:     2.20 cm      LV E/e' lateral: 5.6 LV SV:         49 LV SV Index:   24 LVOT Area:     3.80 cm  LV Volumes (MOD) LV vol d, MOD A2C: 150.0 ml LV vol d, MOD A4C: 147.0 ml LV vol s, MOD A2C: 91.9 ml LV vol s, MOD A4C: 92.3 ml LV SV MOD A2C:     58.1 ml LV SV MOD A4C:     147.0 ml LV SV MOD BP:  56.8 ml RIGHT VENTRICLE            IVC RV Basal diam:  3.00 cm    IVC diam: 1.10 cm RV Mid diam:    2.20 cm RV S prime:     7.62 cm/s TAPSE (M-mode): 1.2 cm LEFT ATRIUM             Index        RIGHT ATRIUM           Index LA diam:        4.20 cm 2.05 cm/m   RA Area:     13.40 cm LA Vol (A2C):   88.6 ml 43.28 ml/m  RA Volume:   29.60 ml  14.46 ml/m LA Vol (A4C):   49.9 ml 24.38 ml/m LA Biplane Vol: 66.7 ml 32.58 ml/m  AORTIC VALVE AV Area (Vmax): 2.38 cm AV Vmax:        116.00 cm/s AV Peak Grad:   5.4 mmHg LVOT Vmax:      72.50 cm/s LVOT Vmean:     52.500 cm/s LVOT VTI:       0.128 m  AORTA Ao Root diam: 3.60 cm Ao Asc diam:  3.30 cm MITRAL VALVE               TRICUSPID VALVE MV Area (PHT): 5.70 cm    TR Peak grad:   15.4 mmHg MV Decel Time: 133 msec    TR Vmax:        196.00 cm/s MV E velocity: 41.55 cm/s MV A velocity: 54.40 cm/s  SHUNTS MV E/A ratio:  0.76        Systemic VTI:  0.13 m                            Systemic Diam: 2.20 cm Rudean Haskell MD Electronically signed by Rudean Haskell MD Signature Date/Time: 12/14/2021/1:57:50 PM    Final    CT Angio Chest  Pulmonary Embolism (PE) W or WO Contrast  Result Date: 12/14/2021 CLINICAL DATA:  COVID, positive D-dimer, evaluate for PE EXAM: CT ANGIOGRAPHY CHEST WITH CONTRAST TECHNIQUE: Multidetector CT imaging of the chest was performed using the standard protocol during bolus administration of intravenous contrast. Multiplanar CT image reconstructions and MIPs were obtained to evaluate the vascular anatomy. RADIATION DOSE REDUCTION: This exam was performed according to the departmental dose-optimization program which includes automated exposure control, adjustment of the mA and/or kV according to patient size and/or use of iterative reconstruction technique. CONTRAST:  56m OMNIPAQUE IOHEXOL 350 MG/ML SOLN COMPARISON:  Chest radiograph dated 12/13/2021. CT chest dated 04/29/2017. FINDINGS: Cardiovascular: Satisfactory opacification the bilateral pulmonary arteries to the segmental level. No evidence of pulmonary embolism. Although not tailored for evaluation of the thoracic aorta, there is no evidence thoracic aortic aneurysm or dissection. Mild cardiomegaly with left atrial enlargement. No pericardial effusion. Left subclavian ICD. Additional stimulator with lead in the right neck. Mediastinum/Nodes: No suspicious mediastinal lymphadenopathy. Visualized thyroid is unremarkable. Lungs/Pleura: Lungs are clear. No focal consolidation. No suspicious pulmonary nodules. No pleural effusion or pneumothorax. Upper Abdomen: Visualized upper abdomen is notable for postsurgical changes E junction and prior cholecystectomy. Musculoskeletal: Mild degenerative changes of the visualized thoracolumbar spine. Review of the MIP images confirms the above findings. IMPRESSION: No evidence of pulmonary embolism. No evidence of acute cardiopulmonary disease. Electronically Signed   By: SJulian HyM.D.   On: 12/14/2021 01:58   DG Chest Port 1  View  Result Date: 12/13/2021 CLINICAL DATA:  COVID EXAM: PORTABLE CHEST 1 VIEW COMPARISON:   Chest x-ray 05/31/2021 FINDINGS: The heart and mediastinal contours are unchanged. No focal consolidation. No pulmonary edema. No pleural effusion. No pneumothorax. No acute osseous abnormality. Left chest wall cardiac defibrillator in grossly similar position. Right chest wall neural stimulator with lead coursing cranially, tip collimated off view. IMPRESSION: No active disease. Electronically Signed   By: Iven Finn M.D.   On: 12/13/2021 16:41    Pending Labs Unresulted Labs (From admission, onward)     Start     Ordered   12/15/21 0500  CBC with Differential/Platelet  Daily at 5am,   R      12/14/21 0045   12/15/21 0500  Comprehensive metabolic panel  Daily at 5am,   R      12/14/21 0045   12/15/21 0500  C-reactive protein  Daily at 5am,   R      12/14/21 0045   12/15/21 0500  Ferritin  Daily at 5am,   R      12/14/21 0045   12/14/21 0045  Expectorated Sputum Assessment w Gram Stain, Rflx to Resp Cult  Once,   R        12/14/21 0045   12/13/21 1624  Blood Culture (routine x 2)  BLOOD CULTURE X 2,   STAT      12/13/21 1624            Vitals/Pain Today's Vitals   12/14/21 1248 12/14/21 1400 12/14/21 1500 12/14/21 1600  BP: (!) 96/53 112/79 (!) 129/96 112/69  Pulse: 91   80  Resp: 19 (!) '23 16 18  '$ Temp: 98.1 F (36.7 C)   97.9 F (36.6 C)  TempSrc: Oral   Oral  SpO2: 100%   97%  PainSc:    5     Isolation Precautions Airborne and Contact precautions  Medications Medications  amiodarone (PACERONE) tablet 200 mg (200 mg Oral Given 12/14/21 1301)  mometasone-formoterol (DULERA) 200-5 MCG/ACT inhaler 2 puff (2 puffs Inhalation Given 12/14/21 0845)  sacubitril-valsartan (ENTRESTO) 97-103 mg per tablet (1 tablet Oral Given 12/14/21 0846)  spironolactone (ALDACTONE) tablet 25 mg (25 mg Oral Not Given 12/14/21 0656)  torsemide (DEMADEX) tablet 20 mg (20 mg Oral Given 12/14/21 1601)  sodium chloride flush (NS) 0.9 % injection 3 mL (3 mLs Intravenous Not Given 12/14/21 1032)   sodium chloride flush (NS) 0.9 % injection 3 mL (3 mLs Intravenous Not Given 12/14/21 1030)  sodium chloride flush (NS) 0.9 % injection 3 mL (has no administration in time range)  0.9 %  sodium chloride infusion (has no administration in time range)  guaiFENesin-dextromethorphan (ROBITUSSIN DM) 100-10 MG/5ML syrup 10 mL (has no administration in time range)  acetaminophen (TYLENOL) tablet 650 mg (650 mg Oral Given 12/14/21 0844)  ondansetron (ZOFRAN) tablet 4 mg ( Oral See Alternative 12/14/21 0844)    Or  ondansetron (ZOFRAN) injection 4 mg (4 mg Intravenous Given 12/14/21 0844)  enoxaparin (LOVENOX) injection 40 mg (40 mg Subcutaneous Given 12/14/21 1306)  molnupiravir EUA (LAGEVRIO) capsule 800 mg (800 mg Oral Given 12/14/21 0846)  pantoprazole (PROTONIX) injection 40 mg (40 mg Intravenous Given 12/14/21 1305)  alum & mag hydroxide-simeth (MAALOX/MYLANTA) 200-200-20 MG/5ML suspension 15 mL (has no administration in time range)  benzonatate (TESSALON) capsule 200 mg (200 mg Oral Given 12/14/21 1602)  lidocaine (LIDODERM) 5 % 1 patch (1 patch Transdermal Not Given 12/14/21 1602)  acetaminophen (TYLENOL) tablet 1,000 mg (1,000  mg Oral Given 12/14/21 1602)  ivabradine (CORLANOR) tablet 7.5 mg (7.5 mg Oral Given 12/14/21 1834)  empagliflozin (JARDIANCE) tablet 10 mg (10 mg Oral Given 12/14/21 1300)  gabapentin (NEURONTIN) capsule 300 mg (has no administration in time range)  magnesium oxide (MAG-OX) tablet 200 mg (200 mg Oral Given 12/14/21 1300)  metoprolol succinate (TOPROL-XL) 24 hr tablet 50 mg (50 mg Oral Given 12/14/21 1259)  mexiletine (MEXITIL) capsule 200 mg (200 mg Oral Given 12/14/21 1259)  mirtazapine (REMERON) tablet 15 mg (has no administration in time range)  potassium chloride SA (KLOR-CON M) CR tablet 20 mEq (20 mEq Oral Given 12/14/21 1259)  oxyCODONE (Oxy IR/ROXICODONE) immediate release tablet 5 mg (has no administration in time range)  sodium chloride 0.9 % bolus 500 mL (0 mLs  Intravenous Stopped 12/14/21 0531)  magnesium sulfate IVPB 1 g 100 mL (0 g Intravenous Stopped 12/14/21 0531)  iohexol (OMNIPAQUE) 350 MG/ML injection 80 mL (80 mLs Intravenous Contrast Given 12/14/21 0153)    Mobility walks Low fall risk   Focused Assessments Pulmonary Assessment Handoff:  Lung sounds:  Diminished  O2 Device: Room Air      R Recommendations: See Admitting Provider Note  Report given to:   Additional Notes: covid +

## 2021-12-15 DIAGNOSIS — U071 COVID-19: Secondary | ICD-10-CM | POA: Diagnosis not present

## 2021-12-15 LAB — COMPREHENSIVE METABOLIC PANEL
ALT: 23 U/L (ref 0–44)
AST: 30 U/L (ref 15–41)
Albumin: 3.3 g/dL — ABNORMAL LOW (ref 3.5–5.0)
Alkaline Phosphatase: 56 U/L (ref 38–126)
Anion gap: 9 (ref 5–15)
BUN: 5 mg/dL — ABNORMAL LOW (ref 6–20)
CO2: 24 mmol/L (ref 22–32)
Calcium: 8.7 mg/dL — ABNORMAL LOW (ref 8.9–10.3)
Chloride: 104 mmol/L (ref 98–111)
Creatinine, Ser: 0.98 mg/dL (ref 0.44–1.00)
GFR, Estimated: >60 mL/min (ref >60)
Glucose, Bld: 104 mg/dL — ABNORMAL HIGH (ref 70–99)
Potassium: 4.3 mmol/L (ref 3.5–5.1)
Sodium: 137 mmol/L (ref 135–145)
Total Bilirubin: 0.4 mg/dL (ref 0.3–1.2)
Total Protein: 7 g/dL (ref 6.5–8.1)

## 2021-12-15 LAB — CBC WITH DIFFERENTIAL/PLATELET
Abs Immature Granulocytes: 0.01 10*3/uL (ref 0.00–0.07)
Basophils Absolute: 0 10*3/uL (ref 0.0–0.1)
Basophils Relative: 0 %
Eosinophils Absolute: 0 10*3/uL (ref 0.0–0.5)
Eosinophils Relative: 0 %
HCT: 41.9 % (ref 36.0–46.0)
Hemoglobin: 13.4 g/dL (ref 12.0–15.0)
Immature Granulocytes: 0 %
Lymphocytes Relative: 15 %
Lymphs Abs: 0.8 10*3/uL (ref 0.7–4.0)
MCH: 28 pg (ref 26.0–34.0)
MCHC: 32 g/dL (ref 30.0–36.0)
MCV: 87.5 fL (ref 80.0–100.0)
Monocytes Absolute: 0.5 10*3/uL (ref 0.1–1.0)
Monocytes Relative: 9 %
Neutro Abs: 4 10*3/uL (ref 1.7–7.7)
Neutrophils Relative %: 76 %
Platelets: 159 10*3/uL (ref 150–400)
RBC: 4.79 MIL/uL (ref 3.87–5.11)
RDW: 13.3 % (ref 11.5–15.5)
WBC: 5.4 10*3/uL (ref 4.0–10.5)
nRBC: 0 % (ref 0.0–0.2)

## 2021-12-15 LAB — FERRITIN: Ferritin: 27 ng/mL (ref 11–307)

## 2021-12-15 LAB — C-REACTIVE PROTEIN: CRP: 4.8 mg/dL — ABNORMAL HIGH (ref <1.0)

## 2021-12-15 MED ORDER — GUAIFENESIN-DM 100-10 MG/5ML PO SYRP
10.0000 mL | ORAL_SOLUTION | Freq: Four times a day (QID) | ORAL | Status: DC
  Administered 2021-12-15 – 2021-12-16 (×3): 10 mL via ORAL
  Filled 2021-12-15 (×3): qty 10

## 2021-12-15 NOTE — Progress Notes (Signed)
PROGRESS NOTE  [name]  DOB: Dec 14, 1975  PCP: Hoyt Koch, MD QIO:[id number]  DOA: 12/13/2021  LOS: 1 day  Hospital Day: 3  Brief narrative: [name] is a 46 y.o. female with PMH significant for DM2, HTN, HLD, OSA on CPAP, chronic systolic and diastolic CHF, arrhythmia s/p AICD, anxiety/depression, arthritis. Patient presented to the ED on 7/22 with complaint of shortness of breath, chest pain. 7/19, patient tested positive for COVID after being exposed to a child with COVID.  She started having chest pain, shortness of breath, persistent cough and had a near syncopal episode earlier on the day of presentation.  In the ED, patient had temperature of 99.6, heart rate 114, blood pressure 136/100, breathing on room air Labs with CBC unremarkable, BMP unremarkable, LDH elevated to 199, CRP elevated to 9.4, lactic acid normal, procalcitonin level normal, D-dimer elevated 1.2 COVID PCR positive in ED Venous blood gas with alkalotic pH at 7.43, PCO2 37, bicarb 25 CT angio chest was negative for pulm embolism or any other acute cardiopulmonary disease Admitted to hospitalist service  Subjective: Patient was seen and examined this morning.   Propped up in bed.  Not in distress.  Cough gradually improving but he still has some and hurts every time she coughs.  Able to ambulate to the bathroom without supplemental oxygen.  Feels gradual improvement.  Assessment and plan: COVID-19 infection -Tested positive for COVID at home on 7/19.  Presented with worsening chest pain, shortness of breath, cough  -Chest imaging without any infiltrates.  Breathing on room air at rest  -On admission, she was started on 5-day course of Lagevrio.   -Her biggest symptoms are nonproductive cough that gives significant chest pain especially on deep breathing.  Currently on scheduled benzonatate, as needed Mucinex, scheduled Tylenol, lidocaine patch and as needed Percocet.  Patient feels like she  is not able to bring up phlegm.  I would schedule Mucinex as well. -CRP downtrending.  Continue to monitor Recent Labs  Lab 12/13/21 1639 12/14/21 0001 12/15/21 0331  SARSCOV2NAA  --  POSITIVE*  --   WBC 5.7  --  5.4  LATICACIDVEN 1.2  --   --   PROCALCITON <0.10  --   --   DDIMER 1.20*  --   --   FERRITIN 24  --  27  LDH 199*  --   --   CRP 9.4*  --  4.8*  ALT 19  --  23    Acute asthma exacerbation Acute respiratory distress -Current respiratory distress precipitated by COVID-19 infection -VBG with alkalotic pH and low CO2. -Not requiring supplemental oxygen at rest.  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  Repeat echocardiogram ordered.   -PTA on Toprol 50 mg daily, Jardiance 10 mg daily, Entresto 97-103 mg twice daily, Aldactone 25 mg daily, torsemide 20 mg daily, -Currently heart failure remains compensated -Resume all CHF medicines as well as potassium and magnesium movement  History of arrhythmia s/p AICD Frequent PVCs -PTA on amiodarone 200 mg daily,, mexiletine 200 mg twice daily, Corlanor 7.5 mg daily -Continue to monitor in telemetry -Monitor and maintain electrolytes Recent Labs  Lab 12/13/21 1639 12/14/21 0000 12/14/21 0438 12/15/21 0331  K 3.9  --  3.9 4.3  MG  --  1.8  --   --   PHOS  --  3.0  --   --  Near syncopal episode -Patient complains of near syncopal episode on the day of presentation probably because of persistent cough.  Blood pressure stable.  Look out for arrhythmia in telemetry.  Chest pain -Likely due to COVID infection/inflammation and persistent cough -Unlikely to be an ischemic cardiac event.  Troponins negative.  No mention of wall motion abnormality in echocardiogram. Recent Labs    12/14/21 0000 12/14/21 0350  CKTOTAL 125  --   TROPONINIHS 11  11     obesity  -Body mass index is 34.16 kg/m. Patient has been advised to make an attempt to improve diet and exercise patterns to aid in weight loss.  OSA -CPAP nightly  Migraine -As needed pain meds  Goals of care   Code Status: Full Code    Mobility: Encourage ambulation  Skin assessment:     Nutritional status:  Body mass index is 34.16 kg/m.          Diet:  Diet Order             Diet Heart Room service appropriate? Yes; Fluid consistency: Thin  Diet effective now                   DVT prophylaxis:  enoxaparin (LOVENOX) injection 40 mg Start: 12/14/21 1000   Antimicrobials: None Fluid: None Consultants: None Family Communication: None at bedside  Status is: Observation  Continue in-hospital care because: Gradually improving Level of care: Progressive   Dispo: The patient is from: Home              Anticipated d/c is to: Pending clinical course.  Hopefully home tomorrow              Patient currently is not medically stable to d/c.   Difficult to place patient No     Infusions:   sodium chloride      Scheduled Meds:  acetaminophen  1,000 mg Oral TID   amiodarone  200 mg Oral Daily   benzonatate  200 mg Oral TID   empagliflozin  10 mg Oral Daily   enoxaparin (LOVENOX) injection  40 mg Subcutaneous Q24H   guaiFENesin-dextromethorphan  10 mL Oral Q6H   ivabradine  7.5 mg Oral BID WC   lidocaine  1 patch Transdermal Q24H   magnesium oxide  200 mg Oral Daily   metoprolol succinate  50 mg Oral Daily   mexiletine  200 mg Oral BID   mirtazapine  15 mg Oral QHS   molnupiravir EUA  4 capsule Oral BID   mometasone-formoterol  2 puff Inhalation BID   pantoprazole (PROTONIX) IV  40 mg Intravenous Q24H   potassium chloride SA  20 mEq Oral Daily   sacubitril-valsartan  1 tablet Oral BID   sodium chloride flush  3 mL Intravenous Q12H   sodium chloride flush  3 mL Intravenous Q12H   spironolactone  25 mg Oral QHS   torsemide  20 mg Oral  Daily    PRN meds: sodium chloride, acetaminophen, alum & mag hydroxide-simeth, gabapentin, ondansetron **OR** ondansetron (ZOFRAN) IV, oxyCODONE, sodium chloride flush   Antimicrobials: Anti-infectives (From admission, onward)    Start     Dose/Rate Route Frequency Ordered Stop   12/14/21 0730  molnupiravir EUA (LAGEVRIO) capsule 800 mg        4 capsule Oral 2 times daily 12/14/21 0716 12/19/21 0959   12/14/21 0045  molnupiravir EUA (LAGEVRIO) capsule 800 mg  Status:  Discontinued        4 capsule  Oral 2 times daily 12/14/21 0045 12/14/21 0716       Objective: Vitals:   12/15/21 0346 12/15/21 0923  BP: 92/62 117/66  Pulse: (!) 57 71  Resp: 20   Temp: 97.7 F (36.5 C)   SpO2: 100%    No intake or output data in the 24 hours ending 12/15/21 1428 Filed Weights   12/14/21 2055  Weight: 93.1 kg   Weight change:  Body mass index is 34.16 kg/m.   Physical Exam: General exam: Pleasant, middle-aged African-American female.  Not in pain at rest except when coughing Skin: No rashes, lesions or ulcers. HEENT: Atraumatic, normocephalic, no obvious bleeding Lungs: Diminished air entry in both bases.  Able to take deeper breath today but he still has painful cough.   CVS: Regular rate and rhythm, no murmur GI/Abd soft, nontender, nondistended, bowel present CNS: Alert, awake, oriented x3 Psychiatry: Mood appropriate Extremities: No pedal edema, no calf tenderness  Data Review: I have personally reviewed the laboratory data and studies available.  F/u labs ordered Unresulted Labs (From admission, onward)     Start     Ordered   12/15/21 0500  CBC with Differential/Platelet  Daily at 5am,   R      12/14/21 0045   12/15/21 0500  Comprehensive metabolic panel  Daily at 5am,   R      12/14/21 0045   12/15/21 0500  C-reactive protein  Daily at 5am,   R      12/14/21 0045   12/15/21 0500  Ferritin  Daily at 5am,   R      12/14/21 0045   12/14/21 0045  Expectorated Sputum  Assessment w Gram Stain, Rflx to Resp Cult  Once,   R        12/14/21 0045            Signed, Terrilee Croak, MD [facility] 12/15/2021

## 2021-12-15 NOTE — Progress Notes (Signed)
  Transition of Care [facility]) Screening Note   Patient Details  Name: CECILY LAWHORNE Date of Birth: [birth year]   Transition of Care [facility]) CM/SW Contact:    Cyndi Bender, RN Phone Number: 12/15/2021, 8:32 AM    Transition of Care Department [facility]) has reviewed patient and no TOC needs have been identified at this time. We will continue to monitor patient advancement through interdisciplinary progression rounds. If new patient transition needs arise, please place a TOC consult.

## 2021-12-15 NOTE — Progress Notes (Signed)
Chaplain responded to a consult request for Advance Directive education.   Chaplain provided the Advance Directive packet as well as education on Advance Directives-documents an individual completes to communicate their health care directions in advance of a time when they may need them. Chaplain informed pt the documents which may be completed here in the hospital are the Living Will and [place].   Chaplain informed that the [place] is a legal document in which an individual names another person, their [place], to make health care decisions when the individual is not able to make them for themselves. The Health Care Agent's function can be temporary or permanent depending on the pt's ability to make and communicate those decisions independently. Chaplain informed pt in the absence of a Health Care Power of [place], the state of [place] directs health care providers to look to the following individuals in the order listed: legal guardian; an attorney?in?fact under a general power of attorney (POA) if that POA includes the right to make health care decisions; a husband or wife; a 22 of parents and adult children; a 93 of adult brothers and sisters; or an individual who has an established relationship with you, who is acting in good faith and who can convey your wishes.  If none of these person are available or willing to make medical decisions on a patient's behalf, the law allows the patient's doctor to make decisions for them as long as another doctor agrees with those decisions.  Chaplain also informed the patient that the Health Care agent has no decision-making authority over any affairs other than those related to his or her medical care.   The chaplain further educated the pt that a Living Will is a legal document that allows an individual to state his or her desire not to receive life-prolonging measures in the event that they  have a condition that is incurable and will result in their death in a short period of time; they are unconscious, and doctors are confident that they will not regain consciousness; and/or they have advanced dementia or other substantial and irreversible loss of mental function. The chaplain informed pt that life-prolonging measures are medical treatments that would only serve to postpone death, including breathing machines, kidney dialysis, antibiotics, artificial nutrition and hydration (tube feeding), and similar forms of treatment and that if an individual is able to express their wishes, they may also make them known without the use of a Living Will, but in the event that an individual is not able to express their wishes themselves, a Living Will allows medical providers and the pt's family and friends ensure that they are not making decisions on the pt's behalf, but rather serving as the pt's voice to convey decisions the pt has already made.   The patient is aware that the decision to create an advance directive is theirs alone and they may chose not to complete the documents or may chose to complete one portion or both.  The patient was informed that they can revoke the documents at any time by striking through them and writing void or by completing new documents, but that it is also advisable that the individual verbally notify interested parties that their wishes have changed.  They are also aware that the document must be signed in the presence of a notary public and two witnesses and that this can be done while the patient is still admitted to the hospital or  after discharge in the community. If they decide to complete Advance Directives after being discharged from the hospital, they have been advised to notify all interested parties and to provide those documents to their physicians and loved ones in addition to bringing them to the hospital in the event of another hospitalization.   The chaplain  informed the pt that if they desire to proceed with completing Advance Directive Documentation while they are still admitted, notary services are typically available at [facility] between the hours of 1:00 and 3:30 Monday-Thursday.    When the patient is ready to have these documents completed, the patient should request that their nurse place a spiritual care consult and indicate that the patient is ready to have their advance directives notarized so that arrangements for witnesses and notary public can be made.  Please page spiritual care if the patient desires further education or has questions.     Please page as further needs arise.  Donald Prose. Elyn Peers, M.Div. [facility] Chaplain Pager [phone] Office [phone]

## 2021-12-16 DIAGNOSIS — U071 COVID-19: Secondary | ICD-10-CM | POA: Diagnosis not present

## 2021-12-16 LAB — CBC WITH DIFFERENTIAL/PLATELET
Abs Immature Granulocytes: 0 10*3/uL (ref 0.00–0.07)
Basophils Absolute: 0 10*3/uL (ref 0.0–0.1)
Basophils Relative: 0 %
Eosinophils Absolute: 0 10*3/uL (ref 0.0–0.5)
Eosinophils Relative: 0 %
HCT: 39.3 % (ref 36.0–46.0)
Hemoglobin: 12.6 g/dL (ref 12.0–15.0)
Immature Granulocytes: 0 %
Lymphocytes Relative: 42 %
Lymphs Abs: 1.2 10*3/uL (ref 0.7–4.0)
MCH: 27.9 pg (ref 26.0–34.0)
MCHC: 32.1 g/dL (ref 30.0–36.0)
MCV: 86.9 fL (ref 80.0–100.0)
Monocytes Absolute: 0.4 10*3/uL (ref 0.1–1.0)
Monocytes Relative: 16 %
Neutro Abs: 1.1 10*3/uL — ABNORMAL LOW (ref 1.7–7.7)
Neutrophils Relative %: 42 %
Platelets: 174 10*3/uL (ref 150–400)
RBC: 4.52 MIL/uL (ref 3.87–5.11)
RDW: 13.2 % (ref 11.5–15.5)
WBC: 2.7 10*3/uL — ABNORMAL LOW (ref 4.0–10.5)
nRBC: 0 % (ref 0.0–0.2)

## 2021-12-16 LAB — COMPREHENSIVE METABOLIC PANEL
ALT: 24 U/L (ref 0–44)
AST: 38 U/L (ref 15–41)
Albumin: 3.2 g/dL — ABNORMAL LOW (ref 3.5–5.0)
Alkaline Phosphatase: 57 U/L (ref 38–126)
Anion gap: 9 (ref 5–15)
BUN: 9 mg/dL (ref 6–20)
CO2: 22 mmol/L (ref 22–32)
Calcium: 8.3 mg/dL — ABNORMAL LOW (ref 8.9–10.3)
Chloride: 103 mmol/L (ref 98–111)
Creatinine, Ser: 1.01 mg/dL — ABNORMAL HIGH (ref 0.44–1.00)
GFR, Estimated: >60 mL/min (ref >60)
Glucose, Bld: 112 mg/dL — ABNORMAL HIGH (ref 70–99)
Potassium: 4.1 mmol/L (ref 3.5–5.1)
Sodium: 134 mmol/L — ABNORMAL LOW (ref 135–145)
Total Bilirubin: 0.7 mg/dL (ref 0.3–1.2)
Total Protein: 6.5 g/dL (ref 6.5–8.1)

## 2021-12-16 LAB — C-REACTIVE PROTEIN: CRP: 2.1 mg/dL — ABNORMAL HIGH (ref <1.0)

## 2021-12-16 LAB — FERRITIN: Ferritin: 27 ng/mL (ref 11–307)

## 2021-12-16 MED ORDER — MOLNUPIRAVIR EUA 200MG CAPSULE: ORAL_CAPSULE | ORAL | 0 refills | Status: DC

## 2021-12-16 MED ORDER — PANTOPRAZOLE SODIUM 40 MG PO TBEC: 40.0000 mg | DELAYED_RELEASE_TABLET | Freq: Every day | ORAL | Status: DC

## 2021-12-16 MED ORDER — ONDANSETRON HCL 4 MG PO TABS: 4.0000 mg | ORAL_TABLET | Freq: Four times a day (QID) | ORAL | 0 refills | Status: AC | PRN

## 2021-12-16 MED ORDER — BENZONATATE 200 MG PO CAPS: 200.0000 mg | ORAL_CAPSULE | Freq: Three times a day (TID) | ORAL | 0 refills | Status: AC

## 2021-12-16 NOTE — Discharge Summary (Signed)
Physician Discharge Summary  [name] QBH:[id number] DOB: 1975-10-17 DOA: 12/13/2021  PCP: Hoyt Koch, MD  Admit date: 12/13/2021 Discharge date: 12/16/2021  Admitted From: Home Discharge disposition: Home  Brief narrative: [name] is a 46 y.o. female with PMH significant for DM2, HTN, HLD, OSA on CPAP, chronic systolic and diastolic CHF, arrhythmia s/p AICD, anxiety/depression, arthritis. Patient presented to the ED on 7/22 with complaint of shortness of breath, chest pain. 7/19, patient tested positive for COVID after being exposed to a child with COVID.  She started having chest pain, shortness of breath, persistent cough and had a near syncopal episode earlier on the day of presentation.  In the ED, patient had temperature of 99.6, heart rate 114, blood pressure 136/100, breathing on room air Labs with CBC unremarkable, BMP unremarkable, LDH elevated to 199, CRP elevated to 9.4, lactic acid normal, procalcitonin level normal, D-dimer elevated 1.2 COVID PCR positive in ED Venous blood gas with alkalotic pH at 7.43, PCO2 37, bicarb 25 CT angio chest was negative for pulm embolism or any other acute cardiopulmonary disease Admitted to hospitalist service  Subjective: Patient was seen and examined this morning.  Sitting up in chair.  Not in distress.  Cough improving.  Chest pain improving.  Feels much better and ready to go home.  Hospital course: COVID-19 infection -Tested positive for COVID at home on 7/19.  Presented with worsening chest pain, shortness of breath, cough  -Chest imaging without any infiltrates.  Breathing on room air at rest  -On admission, she was started on 5-day course of Lagevrio.   -Her biggest symptom was nonproductive cough that gives significant chest pain especially on deep breathing.  She was started on scheduled benzonatate, scheduled Mucinex.  For chest pain, she was treated with scheduled Tylenol, lidocaine patch and as needed  Percocet.  Symptoms are much better. -CRP downtrending.   -Okay to discharge home today to complete antibiotic regimen as an outpatient.  It makes her nauseous and she wants to combine it with Zofran. Recent Labs  Lab 12/13/21 1639 12/14/21 0001 12/15/21 0331 12/16/21 0311  SARSCOV2NAA  --  POSITIVE*  --   --   WBC 5.7  --  5.4 2.7*  LATICACIDVEN 1.2  --   --   --   PROCALCITON <0.10  --   --   --   DDIMER 1.20*  --   --   --   FERRITIN 24  --  27 27  LDH 199*  --   --   --   CRP 9.4*  --  4.8* 2.1*  ALT 19  --  23 24   Acute asthma exacerbation Acute respiratory distress -Current respiratory distress precipitated by COVID-19 infection -VBG with alkalotic pH and low CO2. 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  Repeat echocardiogram ordered.   -PTA on Toprol 50 mg daily, Jardiance 10 mg daily, Entresto 97-103 mg twice daily, Aldactone 25 mg daily, torsemide 20 mg daily, -Currently heart failure remains compensated -Continue all CHF medicines as well as potassium and magnesium movement  History of arrhythmia s/p AICD Frequent PVCs -PTA on amiodarone 200 mg daily, mexiletine 200 mg twice daily, Corlanor 7.5 mg daily -Continue the same -Monitor and maintain electrolytes Recent Labs  Lab 12/13/21 1639 12/14/21 0000 12/14/21 0973 12/15/21 0331 12/16/21 5329  K 3.9  --  3.9 4.3 4.1  MG  --  1.8  --   --   --   PHOS  --  3.0  --   --   --    Near syncopal episode -Patient complains of near syncopal episode on the day of presentation probably because of persistent cough.  Blood pressure stable.  No evidence of arrhythmia in telemetry.  Chest pain -Likely due to COVID infection/inflammation and persistent cough -Unlikely to be an ischemic cardiac event.  Troponins negative.  No  mention of wall motion abnormality in echocardiogram. Recent Labs    12/14/21 0000 12/14/21 0350  CKTOTAL 125  --   TROPONINIHS 11 11    Obesity  -Body mass index is 34.16 kg/m. Patient has been advised to make an attempt to improve diet and exercise patterns to aid in weight loss.  OSA -CPAP nightly  Migraine -As needed pain meds  Wounds:  - Incision (Closed) 11/08/20 Neck Right (Active)  Date First Assessed/Time First Assessed: 11/08/20 0925   Location: Neck  Location Orientation: Right    Assessments 11/08/2020  9:47 AM 11/10/2020  8:22 AM  Dressing Type Liquid skin adhesive Liquid skin adhesive;Foam - Lift dressing to assess site every shift  Dressing -- Clean;Dry;Intact  Site / Wound Assessment Clean;Dry Clean;Dry  Margins Attached edges (approximated) Attached edges (approximated)  Closure Skin glue Skin glue  Drainage Amount None None     No associated orders.     Incision (Closed) 11/08/20 Chest Right (Active)  Date First Assessed/Time First Assessed: 11/08/20 0938   Location: Chest  Location Orientation: Right    Assessments 11/08/2020  9:47 AM 11/10/2020  8:22 AM  Dressing Type Liquid skin adhesive Liquid skin adhesive  Dressing -- Clean;Dry;Intact  Site / Wound Assessment Clean;Dry Clean;Dry  Margins Attached edges (approximated) Attached edges (approximated)  Closure Skin glue Skin glue  Drainage Amount None None     No associated orders.    Discharge Exam:   Vitals:   12/15/21 0923 12/15/21 2214 12/16/21 0111 12/16/21 1211  BP: 117/66 (!) 99/56 93/67 (!) 86/52  Pulse: 71 (!) 59    Resp:   18 (!) 23  Temp:  97.8 F (36.6 C)  98.7 F (37.1 C)  TempSrc:  Oral  Oral  SpO2:  98%  96%  Weight:      Height:        Body mass index is 34.16 kg/m.   General exam: Pleasant, middle-aged African-American female.  Not in pain at rest except when coughing Skin: No rashes, lesions or ulcers. HEENT: Atraumatic, normocephalic, no obvious bleeding Lungs:  Diminished air entry in both bases.  Able to take deeper breath today but he still has painful cough.   CVS: Regular rate and rhythm, no murmur GI/Abd soft, nontender, nondistended, bowel present CNS: Alert, awake, oriented x3 Psychiatry: Mood appropriate Extremities: No pedal edema, no calf tenderness  Follow ups:    Follow-up Information     Hoyt Koch, MD Follow up.   Specialty: Internal Medicine Contact information: [place] [address] [phone]         Bensimhon, Shaune Pascal, MD .   Specialty: Cardiology Contact information: [address] [place] [address] [phone]         Belva Crome, MD .   Specialty: Cardiology Contact information: [phone] [address] [place] [address] [phone]  Evans Lance, MD .   Specialty: Cardiology Contact information: [phone] [address] [place] [address] [phone]                 Discharge Instructions:   Discharge Instructions     Call MD for:  difficulty breathing, headache or visual disturbances   Complete by: As directed    Call MD for:  extreme fatigue   Complete by: As directed    Call MD for:  hives   Complete by: As directed    Call MD for:  persistant dizziness or light-headedness   Complete by: As directed    Call MD for:  persistant nausea and vomiting   Complete by: As directed    Call MD for:  severe uncontrolled pain   Complete by: As directed    Call MD for:  temperature >100.4   Complete by: As directed    Diet - low sodium heart healthy   Complete by: As directed    Discharge instructions   Complete by: As directed    Discharge instructions for CHF Check weight daily -preferably same time every day. Restrict fluid intake to 1200 ml daily Restrict salt intake to less than 2 g daily. Call MD if you have one of the following symptoms 1) 3 pound weight gain in 24 hours or 5  pounds in 1 week  2) swelling in the hands, feet or stomach  3) progressive shortness of breath 4) if you have to sleep on extra pillows at night in order to breathe     General discharge instructions: Follow with Primary MD Hoyt Koch, MD in 7 days  Please request your PCP  to go over your hospital tests, procedures, radiology results at the follow up. Please get your medicines reviewed and adjusted.  Your PCP may decide to repeat certain labs or tests as needed. Do not drive, operate heavy machinery, perform activities at heights, swimming or participation in water activities or provide baby sitting services if your were admitted for syncope or siezures until you have seen by Primary MD or a Neurologist and advised to do so again. [place] Controlled Substance Reporting System database was reviewed. Do not drive, operate heavy machinery, perform activities at heights, swim, participate in water activities or provide baby-sitting services while on medications for pain, sleep and mood until your outpatient physician has reevaluated you and advised to do so again.  You are strongly recommended to comply with the dose, frequency and duration of prescribed medications. Activity: As tolerated with Full fall precautions use walker/cane & assistance as needed Avoid using any recreational substances like cigarette, tobacco, alcohol, or non-prescribed drug. If you experience worsening of your admission symptoms, develop shortness of breath, life threatening emergency, suicidal or homicidal thoughts you must seek medical attention immediately by calling 911 or calling your MD immediately  if symptoms less severe. You must read complete instructions/literature along with all the possible adverse reactions/side effects for all the medicines you take and that have been prescribed to you. Take any new medicine only after you have completely understood and accepted all the possible adverse  reactions/side effects.  Wear Seat belts while driving. You were cared for by a hospitalist during your hospital stay. If you have any questions about your discharge medications or the care you received while you were in the hospital after you are discharged, you can call the unit and ask to speak with the hospitalist or the covering  physician. Once you are discharged, your primary care physician will handle any further medical issues. Please note that NO REFILLS for any discharge medications will be authorized once you are discharged, as it is imperative that you return to your primary care physician (or establish a relationship with a primary care physician if you do not have one).   Increase activity slowly   Complete by: As directed        Discharge Medications:   Allergies as of 12/16/2021       Reactions   Vancomycin Other (See Comments)   "did something to my kidneys," PROGRESSED TO KIDNEY FAILURE!!   Aspirin Other (See Comments)   Wheezing, (Pt states that she just wheezes some when she takes aspirin by itself but she can take aspirin in a combination product).   Contrast Media [iodinated Contrast Media] Other (See Comments)   Multiple CT contrast studies done over 2 weeks caused ARF   Cyclobenzaprine Other (See Comments)   Can not tolerate   Ciprofloxacin Itching, Rash   Farxiga [dapagliflozin] Rash   Sulfa Antibiotics Itching, Rash        Medication List     TAKE these medications    acetaminophen 650 MG CR tablet Commonly known as: TYLENOL Take 650 mg by mouth every 8 (eight) hours as needed for pain (headache).   albuterol 108 (90 Base) MCG/ACT inhaler Commonly known as: ProAir HFA Inhale 1-2 puffs into the lungs every 6 (six) hours as needed for wheezing or shortness of breath. Follow-up appt is due must see provider for future refills   amiodarone 200 MG tablet Commonly known as: PACERONE Take 1 tablet (200 mg total) by mouth daily.   benzonatate 200 MG  capsule Commonly known as: TESSALON Take 1 capsule (200 mg total) by mouth 3 (three) times daily for 5 days.   budesonide-formoterol 160-4.5 MCG/ACT inhaler Commonly known as: Symbicort Inhale 2 puffs into the lungs 2 (two) times daily as needed. Follow-up appt is due must see provider for future refills   Corlanor 7.5 MG Tabs tablet Generic drug: ivabradine TAKE 1 TABLET BY MOUTH TWICE DAILY WITH A MEAL What changed: how much to take   dextromethorphan-guaiFENesin 30-600 MG 12hr tablet Commonly known as: MUCINEX DM Take 1 tablet by mouth 2 (two) times daily as needed for cough.   dicyclomine 20 MG tablet Commonly known as: BENTYL Take 1 tablet (20 mg total) by mouth daily as needed for spasms.   empagliflozin 10 MG Tabs tablet Commonly known as: Jardiance Take 1 tablet (10 mg total) by mouth daily.   Entresto 97-103 MG Generic drug: sacubitril-valsartan Take 1 tablet by mouth 2 (two) times daily.   fluticasone 50 MCG/ACT nasal spray Commonly known as: FLONASE Place 2 sprays into both nostrils daily as needed for allergies. Follow-up appt is due must see provider for future refills   gabapentin 300 MG capsule Commonly known as: NEURONTIN Take 1 capsule (300 mg total) by mouth at bedtime as needed (back spasms).   lidocaine 5 % Commonly known as: Lidoderm Place 1 patch onto the skin daily. Remove & Discard patch within 12 hours or as directed by MD   Magnesium Oxide -Mg Supplement 200 MG Tabs Take 1 tablet (200 mg total) by mouth daily.   metoprolol succinate 50 MG 24 hr tablet Commonly known as: TOPROL-XL Take 1 tablet (50 mg total) by mouth daily. Take with or immediately following a meal.   mexiletine 200 MG capsule Commonly known as: MEXITIL  Take 1 capsule (200 mg total) by mouth 2 (two) times daily.   mirtazapine 15 MG tablet Commonly known as: REMERON Take 1 tablet (15 mg total) by mouth at bedtime.   molnupiravir EUA 200 mg Caps capsule Commonly known  as: LAGEVRIO To complete 5-day course of 800 mg twice a day at home   MULTIVITAMIN GUMMIES WOMENS PO Take 2 tablets by mouth daily.   nitroGLYCERIN 0.4 MG SL tablet Commonly known as: NITROSTAT Place 1 tablet (0.4 mg total) under the tongue every 5 (five) minutes as needed for chest pain.   ondansetron 4 MG tablet Commonly known as: ZOFRAN Take 1 tablet (4 mg total) by mouth every 6 (six) hours as needed for up to 5 days for nausea.   potassium chloride SA 20 MEQ tablet Commonly known as: KLOR-CON M Take 20 mEq by mouth daily.   spironolactone 25 MG tablet Commonly known as: ALDACTONE Take 1 tablet (25 mg total) by mouth at bedtime.   tiZANidine 2 MG tablet Commonly known as: ZANAFLEX TAKE 1 TABLET BY MOUTH AT BEDTIME   torsemide 20 MG tablet Commonly known as: DEMADEX Take 20 mg by mouth daily.   Ubrelvy 100 MG Tabs Generic drug: Ubrogepant Take 100 mg by mouth daily as needed (migraine).   Vitamin D (Ergocalciferol) 1.25 MG (50000 UNIT) Caps capsule Commonly known as: DRISDOL Take 1 capsule (50,000 Units total) by mouth every 7 (seven) days.         The results of significant diagnostics from this hospitalization (including imaging, microbiology, ancillary and laboratory) are listed below for reference.    Procedures and Diagnostic Studies:   ECHOCARDIOGRAM COMPLETE  Result Date: 12/14/2021    ECHOCARDIOGRAM REPORT   Patient Name:   [name] Date of Exam: 12/14/2021 Medical Rec #:  [id number]      Height:       65.0 in Accession #:    [id number]     Weight:       216.8 lb Date of Birth:  10-30-1975      BSA:          2.047 m Patient Age:    35 years       BP:           135/85 mmHg Patient Gender: F              HR:           91 bpm. Exam Location:  Inpatient Procedure: 2D Echo, Cardiac Doppler and Color Doppler Indications:    Chest pain  History:        Patient has prior history of Echocardiogram examinations. CHF;                 Risk Factors:Hypertension.   Sonographer:    Jyl Heinz Referring Phys: [place]  1. Left ventricular ejection fraction, by estimation, is 25 to 30%. The left ventricle has severely decreased function. The left ventricle demonstrates global hypokinesis. The left ventricular internal cavity size was mildly dilated. There is mild concentric left ventricular hypertrophy. Left ventricular diastolic parameters are indeterminate.  2. Right ventricular systolic function is normal. The right ventricular size is normal. Tricuspid regurgitation signal is inadequate for assessing PA pressure.  3. Left atrial size was mild to moderately dilated.  4. The mitral valve is normal in structure. Trivial mitral valve regurgitation. No evidence of mitral stenosis.  5. The aortic valve is tricuspid. Aortic valve regurgitation is not visualized. No  aortic stenosis is present.  6. The inferior vena cava is normal in size with greater than 50% respiratory variability, suggesting right atrial pressure of 3 mmHg. Comparison(s): No significant change from prior study. FINDINGS  Left Ventricle: Left ventricular ejection fraction, by estimation, is 25 to 30%. The left ventricle has severely decreased function. The left ventricle demonstrates global hypokinesis. The left ventricular internal cavity size was mildly dilated. There is mild concentric left ventricular hypertrophy. Left ventricular diastolic parameters are indeterminate. Right Ventricle: The right ventricular size is normal. No increase in right ventricular wall thickness. Right ventricular systolic function is normal. Tricuspid regurgitation signal is inadequate for assessing PA pressure. Left Atrium: Left atrial size was mild to moderately dilated. Right Atrium: Right atrial size was normal in size. Pericardium: Trivial pericardial effusion is present. Mitral Valve: The mitral valve is normal in structure. Trivial mitral valve regurgitation. No evidence of mitral valve stenosis.  Tricuspid Valve: The tricuspid valve is normal in structure. Tricuspid valve regurgitation is not demonstrated. No evidence of tricuspid stenosis. Aortic Valve: The aortic valve is tricuspid. Aortic valve regurgitation is not visualized. No aortic stenosis is present. Aortic valve peak gradient measures 5.4 mmHg. Pulmonic Valve: The pulmonic valve was normal in structure. Pulmonic valve regurgitation is not visualized. No evidence of pulmonic stenosis. Aorta: The aortic root and ascending aorta are structurally normal, with no evidence of dilitation. Venous: The inferior vena cava is normal in size with greater than 50% respiratory variability, suggesting right atrial pressure of 3 mmHg. IAS/Shunts: No atrial level shunt detected by color flow Doppler. Additional Comments: A device lead is visualized in the right atrium and right ventricle.  LEFT VENTRICLE PLAX 2D LVIDd:         5.60 cm      Diastology LVIDs:         4.50 cm      LV e' medial:    4.03 cm/s LV PW:         1.40 cm      LV E/e' medial:  10.3 LV IVS:        1.00 cm      LV e' lateral:   7.40 cm/s LVOT diam:     2.20 cm      LV E/e' lateral: 5.6 LV SV:         49 LV SV Index:   24 LVOT Area:     3.80 cm  LV Volumes (MOD) LV vol d, MOD A2C: 150.0 ml LV vol d, MOD A4C: 147.0 ml LV vol s, MOD A2C: 91.9 ml LV vol s, MOD A4C: 92.3 ml LV SV MOD A2C:     58.1 ml LV SV MOD A4C:     147.0 ml LV SV MOD BP:      56.8 ml RIGHT VENTRICLE            IVC RV Basal diam:  3.00 cm    IVC diam: 1.10 cm RV Mid diam:    2.20 cm RV S prime:     7.62 cm/s TAPSE (M-mode): 1.2 cm LEFT ATRIUM             Index        RIGHT ATRIUM           Index LA diam:        4.20 cm 2.05 cm/m   RA Area:     13.40 cm LA Vol (A2C):   88.6 ml 43.28 ml/m  RA Volume:  29.60 ml  14.46 ml/m LA Vol (A4C):   49.9 ml 24.38 ml/m LA Biplane Vol: 66.7 ml 32.58 ml/m  AORTIC VALVE AV Area (Vmax): 2.38 cm AV Vmax:        116.00 cm/s AV Peak Grad:   5.4 mmHg LVOT Vmax:      72.50 cm/s LVOT Vmean:      52.500 cm/s LVOT VTI:       0.128 m  AORTA Ao Root diam: 3.60 cm Ao Asc diam:  3.30 cm MITRAL VALVE               TRICUSPID VALVE MV Area (PHT): 5.70 cm    TR Peak grad:   15.4 mmHg MV Decel Time: 133 msec    TR Vmax:        196.00 cm/s MV E velocity: 41.55 cm/s MV A velocity: 54.40 cm/s  SHUNTS MV E/A ratio:  0.76        Systemic VTI:  0.13 m                            Systemic Diam: 2.20 cm Rudean Haskell MD Electronically signed by Rudean Haskell MD Signature Date/Time: 12/14/2021/1:57:50 PM    Final    CT Angio Chest Pulmonary Embolism (PE) W or WO Contrast  Result Date: 12/14/2021 CLINICAL DATA:  COVID, positive D-dimer, evaluate for PE EXAM: CT ANGIOGRAPHY CHEST WITH CONTRAST TECHNIQUE: Multidetector CT imaging of the chest was performed using the standard protocol during bolus administration of intravenous contrast. Multiplanar CT image reconstructions and MIPs were obtained to evaluate the vascular anatomy. RADIATION DOSE REDUCTION: This exam was performed according to the departmental dose-optimization program which includes automated exposure control, adjustment of the mA and/or kV according to patient size and/or use of iterative reconstruction technique. CONTRAST:  7m OMNIPAQUE IOHEXOL 350 MG/ML SOLN COMPARISON:  Chest radiograph dated 12/13/2021. CT chest dated 04/29/2017. FINDINGS: Cardiovascular: Satisfactory opacification the bilateral pulmonary arteries to the segmental level. No evidence of pulmonary embolism. Although not tailored for evaluation of the thoracic aorta, there is no evidence thoracic aortic aneurysm or dissection. Mild cardiomegaly with left atrial enlargement. No pericardial effusion. Left subclavian ICD. Additional stimulator with lead in the right neck. Mediastinum/Nodes: No suspicious mediastinal lymphadenopathy. Visualized thyroid is unremarkable. Lungs/Pleura: Lungs are clear. No focal consolidation. No suspicious pulmonary nodules. No pleural effusion or  pneumothorax. Upper Abdomen: Visualized upper abdomen is notable for postsurgical changes E junction and prior cholecystectomy. Musculoskeletal: Mild degenerative changes of the visualized thoracolumbar spine. Review of the MIP images confirms the above findings. IMPRESSION: No evidence of pulmonary embolism. No evidence of acute cardiopulmonary disease. Electronically Signed   By: SJulian HyM.D.   On: 12/14/2021 01:58   DG Chest Port 1 View  Result Date: 12/13/2021 CLINICAL DATA:  COVID EXAM: PORTABLE CHEST 1 VIEW COMPARISON:  Chest x-ray 05/31/2021 FINDINGS: The heart and mediastinal contours are unchanged. No focal consolidation. No pulmonary edema. No pleural effusion. No pneumothorax. No acute osseous abnormality. Left chest wall cardiac defibrillator in grossly similar position. Right chest wall neural stimulator with lead coursing cranially, tip collimated off view. IMPRESSION: No active disease. Electronically Signed   By: MIven FinnM.D.   On: 12/13/2021 16:41     Labs:   Basic Metabolic Panel: Recent Labs  Lab 12/13/21 1639 12/14/21 0000 12/14/21 0438 12/15/21 0331 12/16/21 0311  NA 137  --  136 137 134*  K 3.9  --  3.9 4.3 4.1  CL 107  --   --  104 103  CO2 21*  --   --  24 22  GLUCOSE 96  --   --  104* 112*  BUN 6  --   --  5* 9  CREATININE 0.89  --   --  0.98 1.01*  CALCIUM 9.0  --   --  8.7* 8.3*  MG  --  1.8  --   --   --   PHOS  --  3.0  --   --   --    GFR Estimated Creatinine Clearance: 77.6 mL/min (A) (by C-G formula based on SCr of 1.01 mg/dL (H)). Liver Function Tests: Recent Labs  Lab 12/13/21 1639 12/15/21 0331 12/16/21 0311  AST 24 30 38  ALT '19 23 24  '$ ALKPHOS 57 56 57  BILITOT 0.3 0.4 0.7  PROT 7.2 7.0 6.5  ALBUMIN 3.6 3.3* 3.2*   No results for input(s): "LIPASE", "AMYLASE" in the last 168 hours. No results for input(s): "AMMONIA" in the last 168 hours. Coagulation profile Recent Labs  Lab 12/14/21 0350  INR 1.0     CBC: Recent Labs  Lab 12/13/21 1639 12/14/21 0438 12/15/21 0331 12/16/21 0311  WBC 5.7  --  5.4 2.7*  NEUTROABS 4.1  --  4.0 1.1*  HGB 12.6 12.6 13.4 12.6  HCT 40.1 37.0 41.9 39.3  MCV 88.3  --  87.5 86.9  PLT 163  --  159 174   Cardiac Enzymes: Recent Labs  Lab 12/14/21 0000  CKTOTAL 125   BNP: Invalid input(s): "POCBNP" CBG: No results for input(s): "GLUCAP" in the last 168 hours. D-Dimer Recent Labs    12/13/21 1639  DDIMER 1.20*   Hgb A1c No results for input(s): "HGBA1C" in the last 72 hours. Lipid Profile Recent Labs    12/13/21 1639  TRIG 84   Thyroid function studies Recent Labs    12/14/21 0350  TSH 3.403   Anemia work up Recent Labs    12/15/21 0331 12/16/21 0311  FERRITIN 27 27   Microbiology Recent Results (from the past 240 hour(s))  Blood Culture (routine x 2)     Status: None (Preliminary result)   Collection Time: 12/13/21  4:39 PM   Specimen: BLOOD  Result Value Ref Range Status   Specimen Description BLOOD SITE NOT SPECIFIED  Final   Special Requests   Final    BOTTLES DRAWN AEROBIC AND ANAEROBIC Blood Culture adequate volume   Culture   Final    NO GROWTH 3 DAYS Performed at [facility], [address] [place], [place] [id number]    Report Status PENDING  Incomplete  Blood Culture (routine x 2)     Status: None (Preliminary result)   Collection Time: 12/14/21 12:01 AM   Specimen: BLOOD  Result Value Ref Range Status   Specimen Description BLOOD RIGHT ANTECUBITAL  Final   Special Requests   Final    BOTTLES DRAWN AEROBIC AND ANAEROBIC Blood Culture adequate volume   Culture   Final    NO GROWTH 2 DAYS Performed at [facility], [place] [address], [place], [place] [address]    Report Status PENDING  Incomplete  SARS Coronavirus 2 by RT PCR (hospital order, performed in [facility] hospital lab) *cepheid single result test* Anterior Nasal Swab     Status: Abnormal   Collection Time: 12/14/21 12:01 AM    Specimen: Anterior Nasal Swab  Result Value Ref  Range Status   SARS Coronavirus 2 by RT PCR POSITIVE (A) NEGATIVE Final    Comment: (NOTE) SARS-CoV-2 target nucleic acids are DETECTED  SARS-CoV-2 RNA is generally detectable in upper respiratory specimens  during the acute phase of infection.  Positive results are indicative  of the presence of the identified virus, but do not rule out bacterial infection or co-infection with other pathogens not detected by the test.  Clinical correlation with patient history and  other diagnostic information is necessary to determine patient infection status.  The expected result is negative.  Fact Sheet for Patients:   https://www.patel.info/   Fact Sheet for Healthcare Providers:   https://hall.com/    This test is not yet approved or cleared by the [place] FDA and  has been authorized for detection and/or diagnosis of SARS-CoV-2 by FDA under an Emergency Use Authorization (EUA).  This EUA will remain in effect (meaning this test can be used) for the duration of  the COVID-19 declaration under Section 564(b)(1)  of the Act, 21 U.S.C. section 360-bbb-3(b)(1), unless the authorization is terminated or revoked sooner.   Performed at [facility], [place] [address], [place], [place] [address]     Time coordinating discharge: 35 minutes  Signed: Oline Belk  [facility] 12/16/2021, 1:01 PM

## 2021-12-17 ENCOUNTER — Ambulatory Visit (INDEPENDENT_AMBULATORY_CARE_PROVIDER_SITE_OTHER): Payer: HMO

## 2021-12-17 ENCOUNTER — Other Ambulatory Visit: Payer: Self-pay | Admitting: *Deleted

## 2021-12-17 DIAGNOSIS — I428 Other cardiomyopathies: Secondary | ICD-10-CM

## 2021-12-17 LAB — CUP PACEART REMOTE DEVICE CHECK
Battery Remaining Longevity: 120 mo
Battery Remaining Percentage: 86 %
Brady Statistic RV Percent Paced: 0 %
Date Time Interrogation Session: 20230725195900
HighPow Impedance: 88 Ohm
Implantable Lead Implant Date: 20180312
Implantable Lead Location: 753860
Implantable Lead Model: 293
Implantable Lead Serial Number: 422842
Implantable Pulse Generator Implant Date: 20180312
Lead Channel Impedance Value: 602 Ohm
Lead Channel Setting Pacing Amplitude: 2.5 V
Lead Channel Setting Pacing Pulse Width: 0.4 ms
Lead Channel Setting Sensing Sensitivity: 0.5 mV
Pulse Gen Serial Number: 226601

## 2021-12-17 NOTE — Patient Outreach (Signed)
  Care Coordination [facility] Note Transition Care Management Unsuccessful Follow-up Telephone Call  Date of discharge and from where:  [facility] on 12/16/2021  Attempts:  1st Attempt  Reason for unsuccessful TCM follow-up call:  Left voice message [place] [facility] Management [phone]

## 2021-12-18 ENCOUNTER — Other Ambulatory Visit: Payer: Self-pay | Admitting: *Deleted

## 2021-12-18 DIAGNOSIS — U071 COVID-19: Secondary | ICD-10-CM | POA: Diagnosis not present

## 2021-12-18 DIAGNOSIS — J45909 Unspecified asthma, uncomplicated: Secondary | ICD-10-CM | POA: Diagnosis not present

## 2021-12-18 LAB — CULTURE, BLOOD (ROUTINE X 2)
Culture: NO GROWTH
Special Requests: ADEQUATE

## 2021-12-18 NOTE — Patient Outreach (Signed)
  Care Coordination TOC Note Transition Care Management Unsuccessful Follow-up Telephone Call  Date of discharge and from where:  12/16/21 [facility]  Attempts:  2nd Attempt  Reason for unsuccessful TCM follow-up call:  Left voice message.  Emelia Loron RN, BSN [place] [phone] [email or web address]

## 2021-12-19 ENCOUNTER — Other Ambulatory Visit: Payer: Self-pay | Admitting: *Deleted

## 2021-12-19 ENCOUNTER — Ambulatory Visit: Payer: PPO | Admitting: Internal Medicine

## 2021-12-19 LAB — CULTURE, BLOOD (ROUTINE X 2)
Culture: NO GROWTH
Special Requests: ADEQUATE

## 2021-12-19 NOTE — Patient Outreach (Signed)
  Care Coordination [facility] Note Transition Care Management Unsuccessful Follow-up Telephone Call  Date of discharge and from where:  [id number] [facility]  Attempts:  3rd Attempt  Reason for unsuccessful TCM follow-up call:  Left voice message   [place] [facility] Management [phone]

## 2021-12-22 ENCOUNTER — Encounter: Payer: HMO | Admitting: Student

## 2021-12-22 ENCOUNTER — Ambulatory Visit (INDEPENDENT_AMBULATORY_CARE_PROVIDER_SITE_OTHER): Payer: HMO

## 2021-12-22 DIAGNOSIS — Z95810 Presence of automatic (implantable) cardiac defibrillator: Secondary | ICD-10-CM

## 2021-12-22 DIAGNOSIS — I5042 Chronic combined systolic (congestive) and diastolic (congestive) heart failure: Secondary | ICD-10-CM

## 2021-12-23 ENCOUNTER — Encounter: Payer: Self-pay | Admitting: Internal Medicine

## 2021-12-24 ENCOUNTER — Encounter: Payer: Self-pay | Admitting: Hematology and Oncology

## 2021-12-25 ENCOUNTER — Telehealth: Payer: Self-pay

## 2021-12-25 ENCOUNTER — Ambulatory Visit: Payer: PPO | Admitting: Internal Medicine

## 2021-12-25 NOTE — Telephone Encounter (Signed)
Remote ICM transmission received.  Attempted call to patient regarding ICM remote transmission and left detailed message per DPR.  Advised to return call for any fluid symptoms or questions. Next ICM remote transmission scheduled 01/27/2022.    

## 2021-12-25 NOTE — Progress Notes (Signed)
EPIC Encounter for ICM Monitoring  Patient Name: [name] is a 46 y.o. female Date: 12/25/2021 Primary Care Physican: Hoyt Koch, MD Primary Cardiologist: [place] Electrophysiologist: Caryl Comes 11/19/2021 Weight: 214 lbs                                                 Attempted call to patient and unable to reach.  Left detailed message per DPR regarding transmission. Transmission reviewed. Marland Kitchen  Hospitalization 7/22-7/25 for COVID.    Heartlogic HF Index 21 suggesting possible fluid accumulation starting 6/24.  Thoracic impedance trending up.  Barostim implant 6/27.   Prescribed: Torsemide 20 mg Take 1 tablet  (20 mg total) by mouth daily.  Pt self adjusts Torsemide as needed. Potassium 20 mEq take 1 tablet daily Spironolactone 25 mg take 1 tablet (25 mg total) at bedtime.    Labs: 12/16/2021 Creatinine 1.01, BUN 9, Potassium 4.1, Sodium 134, GFR >60 12/15/2021 Creatinine 0.98, BUN 5, Potassium 4.3, Sodium 137, GFR >60  12/13/2021 Creatinine 0.89, BUN 6, Potassium 3.9, Sodium 137, GFR >60  09/29/2021 Creatinine 0.67, BUN 7, Potassium 3.9, Sodium 137, GFR >60 06/24/2021 Creatinine 0.83, BUN 9, Potassium 3.6, Sodium 139 A complete set of results can be found in Results Review.   Recommendations:  Left voice mail with ICM number and encouraged to call if experiencing any fluid symptoms.   Follow-up plan: ICM clinic phone appointment on 01/27/2022 to recheck fluid levels.  91 day device clinic remote transmission 03/18/2022.              EP/Cardiology next office visit:   01/05/2022 with Oda Kilts, [place].  01/01/2022 with HF clinic.           Copy of ICM check sent to Dr. Caryl Comes.    3 Month Trend    8 Day Data Trend          [name], RN 12/25/2021 10:52 AM

## 2021-12-28 ENCOUNTER — Encounter: Payer: Self-pay | Admitting: Hematology and Oncology

## 2021-12-29 NOTE — Progress Notes (Signed)
Electrophysiology Office Note Date: 01/05/2022  ID:  [name], DOB 19-Feb-1976, MRN [id number]  PCP: Hoyt Koch, MD Primary Cardiologist: Sinclair Grooms, MD Electrophysiologist: Cristopher Peru, MD   CC: Pacemaker follow-up  [name] is a 46 y.o. female seen today for Cristopher Peru, MD for routine electrophysiology followup.   At last visit device titrated up to 11.8 @ 65 ms without stim.  Presented to the office in the following weeks with a sensation of her "tongue being pulled back". This resolved with down titration to 11.0 ma.   She is feeling poorly today. She had COVID at the end of July and doesn't feel like she has recovered. Very tired with slight cough. Has been taking extra torsemide for past couple of days. Heart logic up and thoracic impedence down.   Device History: Barostim (standard) implanted 11/08/20 for Chronic systolic CHF Medtronic Single Chamber ICD implanted 2018 for NICM  Past Medical History:  Diagnosis Date   Acute on chronic systolic (congestive) heart failure ([place]) 04/29/2017   Acute pain of right shoulder 06/16/2017   AICD (automatic cardioverter/defibrillator) present    Anginal pain (HCC)    Anxiety    Arthritis    right shoulder    Asthma    CHF (congestive heart failure) ([place])    Chronic combined systolic and diastolic heart failure ([place]) 03/05/2014   Closed low lateral malleolus fracture 10/23/2013   Cystitis 10/21/2017   Depression    Depression with anxiety 01/20/2013   Diabetes mellitus without complication (HCC)    Diverticulosis    Dyspnea    comes and goes intermittently mostly with exertion    Dysrhythmia    Essential hypertension    Prev followed by [name]    Fibroid    age 37   Gallstones    Generalized abdominal cramping    History of cardiomyopathy    Hypertension    IBS (irritable bowel syndrome)    ICD (implantable cardioverter-defibrillator), single, 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   Insomnia  05/07/2017   Labral tear of shoulder 04/04/2015    Injected 04/04/2015 Injected 12/03/2015    Migraine    "monthly" (08/03/2016)   Myofascial pain 06/16/2017   NICM (nonischemic cardiomyopathy) ([place]) 08/03/2016   Nonallopathic lesion of lumbosacral region 11/16/2016   Nonallopathic lesion of sacral region 11/16/2016   Nonallopathic lesion of thoracic region 08/20/2014   Nonspecific chest pain 04/28/2017   OSA (obstructive sleep apnea) 01/02/2013   NPSG 2009:  AHI 9/hr. CPAP intolerance >> "smothering" Good tolerance of auto device (optimal pressure 12-13 on download).  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   PVC (premature ventricular contraction) 06/23/2016   Seizures ([place])    "as a child" (08/03/2016)   Termination of pregnancy    due to cardiac risk   Past Surgical History:  Procedure Laterality Date   CARDIAC CATHETERIZATION N/A 11/11/2015   Procedure: Right/Left Heart Cath and Coronary Angiography;  Surgeon: Troy Sine, MD;  Location: [place] [facility];  Service: Cardiovascular;  Laterality: N/A;   CARDIAC CATHETERIZATION  ~ 2015   CARDIAC DEFIBRILLATOR PLACEMENT  08/03/2016   CESAREAN SECTION  1999   COLONOSCOPY WITH PROPOFOL N/A 04/21/2016   Procedure: COLONOSCOPY WITH PROPOFOL;  Surgeon: Jerene Bears, MD;  Location: [facility];  Service: Gastroenterology;  Laterality: N/A;   ESOPHAGOGASTRODUODENOSCOPY (EGD) WITH PROPOFOL N/A 04/21/2016   Procedure: ESOPHAGOGASTRODUODENOSCOPY (EGD) WITH PROPOFOL;  Surgeon: Jerene Bears, MD;  Location: [facility];  Service: Gastroenterology;  Laterality: N/A;   FOOT FRACTURE SURGERY Right ~ 2003   FRACTURE SURGERY     ICD IMPLANT N/A 08/03/2016   Procedure: ICD Implant;  Surgeon: Deboraha Sprang, MD;  Location: [place] [facility];  Service: Cardiovascular;  Laterality: N/A;    LAPAROSCOPIC CHOLECYSTECTOMY  12/2006   LAPAROSCOPIC GASTRIC SLEEVE RESECTION     LAPAROSCOPY ABDOMEN DIAGNOSTIC  2008   "cut bile duct w/gallbladder OR; had to go in later & fix leak; hospitalized for 2 months"   LEFT HEART CATHETERIZATION WITH CORONARY ANGIOGRAM N/A 02/26/2014   Procedure: LEFT HEART CATHETERIZATION WITH CORONARY ANGIOGRAM;  Surgeon: Jettie Booze, MD;  Location[facility] CATH LAB;  Service: Cardiovascular;  Laterality: N/A;   OVARIAN CYST REMOVAL Right 1999   PVC ABLATION N/A 12/01/2017   Procedure: PVC ABLATION;  Surgeon: Evans Lance, MD;  Location: [place] [facility];  Service: Cardiovascular;  Laterality: N/A;   RIGHT HEART CATH N/A 08/05/2017   Procedure: RIGHT HEART CATH;  Surgeon: Jolaine Artist, MD;  Location: [place] [facility];  Service: Cardiovascular;  Laterality: N/A;   RIGHT HEART CATH N/A 11/16/2019   Procedure: RIGHT HEART CATH;  Surgeon: Jolaine Artist, MD;  Location: [place] [facility];  Service: Cardiovascular;  Laterality: N/A;   TUBAL LIGATION  1999    Current Outpatient Medications  Medication Sig Dispense Refill   acetaminophen (TYLENOL) 650 MG CR tablet Take 650 mg by mouth every 8 (eight) hours as needed for pain (headache).     albuterol (PROAIR HFA) 108 (90 Base) MCG/ACT inhaler Inhale 1-2 puffs into the lungs every 6 (six) hours as needed for wheezing or shortness of breath. Follow-up appt is due must see provider for future refills 18 g 0   amiodarone (PACERONE) 200 MG tablet Take 1 tablet (200 mg total) by mouth daily. 90 tablet 3   budesonide-formoterol (SYMBICORT) 160-4.5 MCG/ACT inhaler Inhale 2 puffs into the lungs 2 (two) times daily as needed. Follow-up appt is due must see provider for future refills 10 g 0   CORLANOR 7.5 MG TABS tablet TAKE 1 TABLET BY MOUTH TWICE DAILY WITH A MEAL 60 tablet 0   dicyclomine (BENTYL) 20 MG tablet Take 1 tablet (20 mg total) by mouth daily as needed for spasms. 90 tablet 3   empagliflozin  (JARDIANCE) 10 MG TABS tablet Take 1 tablet (10 mg total) by mouth daily. 90 tablet 3   fluticasone (FLONASE) 50 MCG/ACT nasal spray Place 2 sprays into both nostrils daily as needed for allergies. Follow-up appt is due must see provider for future refills 16 g 0   gabapentin (NEURONTIN) 300 MG capsule Take 1 capsule (300 mg total) by mouth at bedtime as needed (back spasms). 90 capsule 3   lidocaine (LIDODERM) 5 % Place 1 patch onto the skin daily. Remove & Discard patch within 12 hours or as directed by MD (Patient taking differently: Place 1 patch onto the skin daily. Remove & Discard patch within 12 hours or as directed by MD(as needed)) 90 patch 3   Magnesium Oxide 200 MG TABS Take 1 tablet (200 mg total) by mouth daily. 90 tablet 3   metoprolol succinate (TOPROL-XL) 50 MG 24 hr tablet Take 1 tablet (50 mg total) by mouth daily.  Take with or immediately following a meal. 90 tablet 3   mexiletine (MEXITIL) 200 MG capsule Take 1 capsule (200 mg total) by mouth 2 (two) times daily. 60 capsule 4   Multiple Vitamins-Minerals (MULTIVITAMIN GUMMIES WOMENS PO) Take 2 tablets by mouth daily.     nitroGLYCERIN (NITROSTAT) 0.4 MG SL tablet Place 1 tablet (0.4 mg total) under the tongue every 5 (five) minutes as needed for chest pain. 75 tablet 0   potassium chloride SA (KLOR-CON M) 20 MEQ tablet Take 1 tablet (20 mEq total) by mouth daily. 90 tablet 3   sacubitril-valsartan (ENTRESTO) 97-103 MG Take 1 tablet by mouth 2 (two) times daily. 60 tablet 3   spironolactone (ALDACTONE) 25 MG tablet Take 1 tablet (25 mg total) by mouth at bedtime. 90 tablet 3   tiZANidine (ZANAFLEX) 2 MG tablet Take 1 tablet (2 mg total) by mouth at bedtime. 30 tablet 0   torsemide (DEMADEX) 20 MG tablet Take 20 mg by mouth daily.     Ubrogepant (UBRELVY) 100 MG TABS Take 100 mg by mouth daily as needed (migraine). 30 tablet 3   Vitamin D, Ergocalciferol, (DRISDOL) 1.25 MG (50000 UNIT) CAPS capsule Take 1 capsule (50,000 Units  total) by mouth every 7 (seven) days. 12 capsule 0   No current facility-administered medications for this visit.    Allergies:   Vancomycin, Aspirin, Contrast media [iodinated contrast media], Ciprofloxacin, Cyclobenzaprine, Farxiga [dapagliflozin], and Sulfa antibiotics   Social History: Social History   Socioeconomic History   Marital status: Married    Spouse name: Not on file   Number of children: 1   Years of education: Not on file   Highest education level: Not on file  Occupational History   Occupation: stay at home mom  Tobacco Use   Smoking status: Never   Smokeless tobacco: Never  Vaping Use   Vaping Use: Never used  Substance and Sexual Activity   Alcohol use: No   Drug use: No   Sexual activity: Yes  Other Topics Concern   Not on file  Social History Narrative   Not on file   Social Determinants of Health   Financial Resource Strain: Not on file  Food Insecurity: No Food Insecurity (08/23/2019)   Hunger Vital Sign    Worried About Running Out of Food in the Last Year: Never true    Ran Out of Food in the Last Year: Never true  Transportation Needs: No Transportation Needs (08/23/2019)   PRAPARE - Hydrologist (Medical): No    Lack of Transportation (Non-Medical): No  Physical Activity: Insufficiently Active (11/28/2019)   Exercise Vital Sign    Days of Exercise per Week: 2 days    Minutes of Exercise per Session: 40 min  Stress: Not on file  Social Connections: Socially Isolated (11/28/2019)   Social Connection and Isolation Panel [NHANES]    Frequency of Communication with Friends and Family: Twice a week    Frequency of Social Gatherings with Friends and Family: Never    Attends Religious Services: Never    Marine scientist or Organizations: No    Attends Music therapist: Never    Marital Status: Married  Human resources officer Violence: Not on file    Family History: Family History  Problem Relation Age  of Onset   Emphysema Maternal Grandmother        smoked   Heart disease Maternal Grandmother 22       MI  Rheum arthritis Mother    Allergies Daughter    Colon cancer Neg Hx    Review of systems complete and found to be negative unless listed in HPI.    Physical Exam: Vitals:   01/05/22 0931  BP: 114/82  Pulse: 86  SpO2: 99%  Weight: 208 lb (94.3 kg)  Height: '5\' 5"'$  (1.651 m)    General: Pleasant, NAD. No resp difficulty Psych: Normal affect. HEENT:  Normal, without mass or lesion.         Neck: Supple, no bruits or JVD. Carotids 2+. No lymphadenopathy/thyromegaly appreciated. Heart: PMI nondisplaced. RRR no s3, s4, or murmurs. Lungs:  Resp regular and unlabored, CTA. Abdomen: Soft, non-tender, non-distended, No HSM, BS + x 4.   Extremities: No clubbing, cyanosis or edema. DP/PT/Radials 2+ and equal bilaterally. Neuro: Alert and oriented X 3. Moves all extremities spontaneously.   Barostim interrogation: Performed personally ; see scanned report  ICD Interrogation- reviewed in detail today,  See PACEART report  EKG:  EKG is not ordered today.  Recent Labs: 05/15/2021: NT-Pro BNP 576 12/14/2021: Magnesium 1.8; TSH 3.403 12/16/2021: ALT 24; Hemoglobin 12.6; Platelets 174 01/01/2022: B Natriuretic Peptide 822.1; BUN 6; Creatinine, Ser 0.80; Potassium 4.5; Sodium 136   Wt Readings from Last 3 Encounters:  01/01/22 213 lb 9.6 oz (96.9 kg)  12/14/21 205 lb 4 oz (93.1 kg)  09/29/21 216 lb 12.8 oz (98.3 kg)     Other studies Reviewed: Additional studies/ records that were reviewed today include: Previous EP office notes, Previous remote checks, Most recent labwork.   Assessment and Plan:  1. Chronic systolic CHF s/p [employer] and Barostim implantation Echo 10/03/2020 LVEF 30-35% NYHA III-IIIb symptoms Device at 11.0 ma @ 65 ms chronically. She had stim described as a "rubber band around her neck" at 12.2. She has had stim described as her "tongue pulling back" at  11.4 and higher Device impedence stable Normal device function See scanned report.  Heart logic elevated. Encouraged to continue taking extra torsemide for 2-3 more days.    2. Frequent PVCs Not candidate for re-do ablation here due to location of her PVCs Previous Zio patch with PVC burden ~13%  Optimize GDMT and BB as tolerated.  Will increase toprol to 75 mg daily to see how she tolerates.  Barostim titration has not been associated with statistically significant hypotension, but if needed, we can turn her back down.  Continue amiodarone 200 mg daily and mexiletine 200 mg BID. She has no other good AAD options.  She is open to tertiary referral, but would like to "cool off" from [place] and see Dr. Lovena Le.   3. Intermittent dysphagia Suspect this may have been a product of stim from her device. Has not recurred after downtitration and subsequent decrease in pulse width.  Current medicines are reviewed at length with the patient today.    Labs/ tests ordered today include:   Disposition:   Follow up with Dr. Lovena Le in 6-8 weeks. She would like to wait to see how she recovers from [place] prior to tertiary referral. She wants to see Dr. Lovena Le back to discuss since it has been some time.   Jacalyn Lefevre, PA-C  01/05/2022 9:34 AM  [facility] [address] [place] [address] [phone] (office) [phone] (fax)

## 2022-01-01 ENCOUNTER — Telehealth (HOSPITAL_COMMUNITY): Payer: Self-pay | Admitting: Pharmacy Technician

## 2022-01-01 ENCOUNTER — Telehealth (HOSPITAL_COMMUNITY): Payer: Self-pay

## 2022-01-01 ENCOUNTER — Other Ambulatory Visit (HOSPITAL_COMMUNITY): Payer: Self-pay

## 2022-01-01 ENCOUNTER — Ambulatory Visit (HOSPITAL_COMMUNITY)
Admission: RE | Admit: 2022-01-01 | Discharge: 2022-01-01 | Disposition: A | Discharge status: Home / Self Care | Payer: HMO | Source: Ambulatory Visit | Attending: Adult Health | Admitting: Adult Health

## 2022-01-01 ENCOUNTER — Encounter (HOSPITAL_COMMUNITY): Payer: Self-pay

## 2022-01-01 ENCOUNTER — Other Ambulatory Visit (HOSPITAL_COMMUNITY): Payer: Self-pay | Admitting: Family Medicine

## 2022-01-01 ENCOUNTER — Encounter: Payer: Self-pay | Admitting: Hematology and Oncology

## 2022-01-01 VITALS — BP 126/70 | HR 86 | Wt 213.6 lb

## 2022-01-01 DIAGNOSIS — K589 Irritable bowel syndrome without diarrhea: Secondary | ICD-10-CM | POA: Insufficient documentation

## 2022-01-01 DIAGNOSIS — E119 Type 2 diabetes mellitus without complications: Secondary | ICD-10-CM | POA: Diagnosis not present

## 2022-01-01 DIAGNOSIS — Z8616 Personal history of COVID-19: Secondary | ICD-10-CM | POA: Diagnosis not present

## 2022-01-01 DIAGNOSIS — Z79899 Other long term (current) drug therapy: Secondary | ICD-10-CM | POA: Insufficient documentation

## 2022-01-01 DIAGNOSIS — F32A Depression, unspecified: Secondary | ICD-10-CM | POA: Diagnosis not present

## 2022-01-01 DIAGNOSIS — F419 Anxiety disorder, unspecified: Secondary | ICD-10-CM | POA: Diagnosis not present

## 2022-01-01 DIAGNOSIS — I428 Other cardiomyopathies: Secondary | ICD-10-CM

## 2022-01-01 DIAGNOSIS — G4733 Obstructive sleep apnea (adult) (pediatric): Secondary | ICD-10-CM | POA: Diagnosis not present

## 2022-01-01 DIAGNOSIS — R531 Weakness: Secondary | ICD-10-CM | POA: Insufficient documentation

## 2022-01-01 DIAGNOSIS — Z6835 Body mass index (BMI) 35.0-35.9, adult: Secondary | ICD-10-CM | POA: Diagnosis not present

## 2022-01-01 DIAGNOSIS — Z9884 Bariatric surgery status: Secondary | ICD-10-CM | POA: Diagnosis not present

## 2022-01-01 DIAGNOSIS — I493 Ventricular premature depolarization: Secondary | ICD-10-CM | POA: Insufficient documentation

## 2022-01-01 DIAGNOSIS — R0609 Other forms of dyspnea: Secondary | ICD-10-CM | POA: Diagnosis not present

## 2022-01-01 DIAGNOSIS — I110 Hypertensive heart disease with heart failure: Secondary | ICD-10-CM | POA: Diagnosis not present

## 2022-01-01 DIAGNOSIS — Z95810 Presence of automatic (implantable) cardiac defibrillator: Secondary | ICD-10-CM

## 2022-01-01 DIAGNOSIS — R008 Other abnormalities of heart beat: Secondary | ICD-10-CM | POA: Insufficient documentation

## 2022-01-01 DIAGNOSIS — I5042 Chronic combined systolic (congestive) and diastolic (congestive) heart failure: Secondary | ICD-10-CM

## 2022-01-01 LAB — BASIC METABOLIC PANEL
Anion gap: 6 (ref 5–15)
BUN: 6 mg/dL (ref 6–20)
CO2: 24 mmol/L (ref 22–32)
Calcium: 9.2 mg/dL (ref 8.9–10.3)
Chloride: 106 mmol/L (ref 98–111)
Creatinine, Ser: 0.8 mg/dL (ref 0.44–1.00)
GFR, Estimated: >60 mL/min (ref >60)
Glucose, Bld: 96 mg/dL (ref 70–99)
Potassium: 4.5 mmol/L (ref 3.5–5.1)
Sodium: 136 mmol/L (ref 135–145)

## 2022-01-01 LAB — BRAIN NATRIURETIC PEPTIDE: B Natriuretic Peptide: 822.1 pg/mL — ABNORMAL HIGH (ref 0.0–100.0)

## 2022-01-01 MED ORDER — ALBUTEROL SULFATE HFA 108 (90 BASE) MCG/ACT IN AERS
1.0000 | INHALATION_SPRAY | Freq: Four times a day (QID) | RESPIRATORY_TRACT | 0 refills | Status: DC | PRN
Start: 2022-01-01 — End: 2022-03-13

## 2022-01-01 NOTE — Telephone Encounter (Signed)
Called and left patient a voice message to confirm/remind patient of their appointment at the [facility] on 01/01/22.

## 2022-01-01 NOTE — Addendum Note (Signed)
Encounter addended by: Rockwell Alexandria, CMA on: 01/01/2022 12:55 PM  Actions taken: Clinical Note Signed

## 2022-01-01 NOTE — Progress Notes (Signed)
Advanced Heart Failure Clinic Note   Referring Physician: PCP: Hoyt Koch, MD PCP-Cardiologist: Sinclair Grooms, MD  [facility]: Dr. Haroldine Laws   Reason for Visit: F/u for Chronic Systolic Heart Failure  HPI: [name] is a 46 y.o. 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   Admitted 7/19 with CP Symptoms thought to be related to frequent PVCs. EP consulted and scheduled her for PVC ablation. Echo repeated 11/22/17 and showed improved EF 50-55%.    Underwent PVC ablation 12/01/17. Was felt not to be successful.  Was on flecainide but discontinued due to reduced EF. Placed on amio   Echo 10/2018 showed drop in EF back down to 25-30%. Was instructed to f/u in clinic but pt did not. She had outpatient monitor 04/2018 that showed rare PVCs.    Admitted 6/21 with chest pain and shortness of breath. Viral panel negative. ECHO EF < 20% and normal RV. Had cath with normal cors and preserved cardiac output. Plan was to f/u with EP regarding PVCs. She has remained on AAD therapy w/ amiodarone + ? blocker therapy.  Admitted to [facility] 4/22 w/ increased dyspnea and volume overload. Also w/ increased PVC burden. Echo showed EF 20-25%, RV normal. She was diuresed w/ IV Lasix and placed on amiodarone gtt for PVC suppression. Seen by Dr. Lovena Le, due to the location of her PVCs, she is not a candidate for re-do ablation.   S/p Barostim 6/22.  Zio 8/22: 13% PVCs (unifocal)  Seen by EP 12/22 and Barostim titrated. Not felt to have any other options for PVCs.   Echo 06/24/21 EF 25-30% RV normal   Admitted 12/13/21 with COVID.  5-day course of Lagevrio.  Discharged to home on 12/16/21.   Today she returns for HF follow up.Overall  feeling weak. Having intermittent lightheadedness x 2 days. Remains SOB with exertion. Having ongoing cough Denies PND/Orthopnea. Appetite ok. No fever or chills. Weight at home 210. Taking all medications except she cant afford jardiance. Co Pay 400.00. She has been out of jardiance the end of July.   [employer] Score: 33 Activity 0.4 hour per day. Mean Heart Rate 102  No A fib or VT. No shocks. Impedance down. Personally checked.   Cardiac Studies  - Cath (6/17): revealed normal vessels, moderate elevation in pulmonary artery pressures, and LVEF 25-30%.   - RHC (6/21):   RA = 4 RV = 32/6 PA = 31/8 (21) PCW = 12 Fick cardiac output/index = 7.0/3.4 Thermo CO/CI = 5.2/2.5 PVR =1.7 (Thermo) Ao sat = 98% PA sat = 67%, 69%   - Echo 6/15 35-40% - Echo 6/17 25-30% - Echo 12/17 20-25% - Echo 8/18 EF 25% - Echo 7/19: EF 50-55% - Echo 6/20: EF 25-30% - Echo 11/13/19: Ef < 20% RV normal  - Echo 4/22: EF 20-25%, RV normal    - CPX (2/18): FVC 2.57 (79%)      FEV1 2.14 (81%)        FEV1/FVC 83 (101%)        MVV 107 (102%) Resting HR: 106 Peak HR: 166   (93% age predicted max HR) BP rest: 136/98 BP peak: 174/88 Peak VO2: 17.1 (85% predicted peak VO2) - corrects to 28.4 for ibw  VE/VCO2 slope:  33 OUES: 2.16 Peak RER: 1.09 Ventilatory Threshold: 14.6 (73% predicted or measured peak VO2) VE/MVV:  59% PETCO2 at peak:  33 O2pulse:  10   (83% predicted O2pulse)   - CPX (12/18): pVO2 14.0 (corrects to 23.0 for IBW) VeVCO2  32 RER 1.0  - PFTs w/ DLOC (4/22) Mild Restriction Normal Diffusion   Review of systems complete and found to be negative unless listed in HPI.    Past Medical History:  Diagnosis Date   Acute on chronic systolic (congestive) heart failure (HCC) 04/29/2017   Acute pain of right shoulder 06/16/2017   AICD (automatic cardioverter/defibrillator) present    Anginal pain (HCC)    Anxiety    Arthritis    right shoulder    Asthma    CHF (congestive heart  failure) ([place])    Chronic combined systolic and diastolic heart failure ([place]) 03/05/2014   Closed low lateral malleolus fracture 10/23/2013   Cystitis 10/21/2017   Depression    Depression with anxiety 01/20/2013   Diabetes mellitus without complication (HCC)    Diverticulosis    Dyspnea    comes and goes intermittently mostly with exertion    Dysrhythmia    Essential hypertension    Prev followed by [name]    Fibroid    age 49   Gallstones    Generalized abdominal cramping    History of cardiomyopathy    Hypertension    IBS (irritable bowel syndrome)    ICD (implantable cardioverter-defibrillator), single, 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 CPAP intolerance >> "smothering" Good tolerance of auto device (optimal pressure 12-13 on download).  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     albuterol (PROAIR HFA) 108 (90 Base) MCG/ACT inhaler Inhale 1-2 puffs into the lungs every 6 (six) hours as needed for wheezing or  shortness of breath. Follow-up appt is due must see provider for future refills 18 g 0   amiodarone (PACERONE) 200 MG tablet Take 1 tablet (200 mg total) by mouth daily. 90 tablet 3   budesonide-formoterol (SYMBICORT) 160-4.5 MCG/ACT inhaler Inhale 2 puffs into the lungs 2 (two) times daily as needed. Follow-up appt is due must see provider for future refills 10 g 0   CORLANOR 7.5 MG TABS tablet TAKE 1 TABLET BY MOUTH TWICE DAILY WITH A MEAL 60 tablet 0   dicyclomine (BENTYL) 20 MG tablet Take 1 tablet (20 mg total) by mouth daily as needed  for spasms. 90 tablet 3   empagliflozin (JARDIANCE) 10 MG TABS tablet Take 1 tablet (10 mg total) by mouth daily. 90 tablet 3   fluticasone (FLONASE) 50 MCG/ACT nasal spray Place 2 sprays into both nostrils daily as needed for allergies. Follow-up appt is due must see provider for future refills 16 g 0   gabapentin (NEURONTIN) 300 MG capsule Take 1 capsule (300 mg total) by mouth at bedtime as needed (back spasms). 90 capsule 3   lidocaine (LIDODERM) 5 % Place 1 patch onto the skin daily. Remove & Discard patch within 12 hours or as directed by MD (Patient taking differently: Place 1 patch onto the skin daily. Remove & Discard patch within 12 hours or as directed by MD(as needed)) 90 patch 3   Magnesium Oxide 200 MG TABS Take 1 tablet (200 mg total) by mouth daily. 90 tablet 3   metoprolol succinate (TOPROL-XL) 50 MG 24 hr tablet Take 1 tablet (50 mg total) by mouth daily. Take with or immediately following a meal. 90 tablet 3   mexiletine (MEXITIL) 200 MG capsule Take 1 capsule (200 mg total) by mouth 2 (two) times daily. 60 capsule 4   Multiple Vitamins-Minerals (MULTIVITAMIN GUMMIES WOMENS PO) Take 2 tablets by mouth daily.     nitroGLYCERIN (NITROSTAT) 0.4 MG SL tablet Place 1 tablet (0.4 mg total) under the tongue every 5 (five) minutes as needed for chest pain. 75 tablet 0   potassium chloride SA (KLOR-CON M) 20 MEQ tablet Take 20 mEq by mouth daily.      sacubitril-valsartan (ENTRESTO) 97-103 MG Take 1 tablet by mouth 2 (two) times daily. 60 tablet 3   spironolactone (ALDACTONE) 25 MG tablet Take 1 tablet (25 mg total) by mouth at bedtime. 90 tablet 3   tiZANidine (ZANAFLEX) 2 MG tablet TAKE 1 TABLET BY MOUTH AT BEDTIME (Patient taking differently: Take 2 mg by mouth at bedtime. As needed) 30 tablet 0   torsemide (DEMADEX) 20 MG tablet Take 20 mg by mouth daily.     Ubrogepant (UBRELVY) 100 MG TABS Take 100 mg by mouth daily as needed (migraine). 30 tablet 3   Vitamin D, Ergocalciferol, (DRISDOL) 1.25 MG (50000 UNIT) CAPS capsule Take 1 capsule (50,000 Units total) by mouth every 7 (seven) days. 12 capsule 0   No current facility-administered medications for this encounter.   Allergies  Allergen Reactions   Vancomycin Other (See Comments)    "did something to my kidneys," PROGRESSED TO KIDNEY FAILURE!!   Aspirin Other (See Comments)    Wheezing, (Pt states that she just wheezes some when she takes aspirin by itself but she can take aspirin in a combination product).   Contrast Media [Iodinated Contrast Media] Other (See Comments)    Multiple CT contrast studies done over 2 weeks caused ARF   Cyclobenzaprine Other (See Comments)    Can not tolerate   Ciprofloxacin Itching and Rash   Farxiga [Dapagliflozin] Rash   Sulfa Antibiotics Itching and Rash   Social History   Socioeconomic History   Marital status: Married    Spouse name: Not on file   Number of children: 1   Years of education: Not on file   Highest education level: Not on file  Occupational History   Occupation: stay at home mom  Tobacco Use   Smoking status: Never   Smokeless tobacco: Never  Vaping Use   Vaping Use: Never used  Substance and Sexual Activity   Alcohol use: No   Drug  use: No   Sexual activity: Yes  Other Topics Concern   Not on file  Social History Narrative   Not on file   Social Determinants of Health   Financial Resource Strain: Not on file   Food Insecurity: No Food Insecurity (08/23/2019)   Hunger Vital Sign    Worried About Running Out of Food in the Last Year: Never true    Ran Out of Food in the Last Year: Never true  Transportation Needs: No Transportation Needs (08/23/2019)   PRAPARE - Hydrologist (Medical): No    Lack of Transportation (Non-Medical): No  Physical Activity: Insufficiently Active (11/28/2019)   Exercise Vital Sign    Days of Exercise per Week: 2 days    Minutes of Exercise per Session: 40 min  Stress: Not on file  Social Connections: Socially Isolated (11/28/2019)   Social Connection and Isolation Panel [NHANES]    Frequency of Communication with Friends and Family: Twice a week    Frequency of Social Gatherings with Friends and Family: Never    Attends Religious Services: Never    Marine scientist or Organizations: No    Attends Music therapist: Never    Marital Status: Married  Human resources officer Violence: Not on file   Family History  Problem Relation Age of Onset   Emphysema Maternal Grandmother        smoked   Heart disease Maternal Grandmother 30       MI   Rheum arthritis Mother    Allergies Daughter    Colon cancer Neg Hx    BP 126/70   Pulse 86   Wt 96.9 kg (213 lb 9.6 oz)   SpO2 97%   BMI 35.54 kg/m   Wt Readings from Last 3 Encounters:  01/01/22 96.9 kg (213 lb 9.6 oz)  12/14/21 93.1 kg (205 lb 4 oz)  09/29/21 98.3 kg (216 lb 12.8 oz)   General:  Walked in the clinic. No resp difficulty HEENT: normal Neck: supple. JVP 10-11 Carotids 2+ bilat; no bruits. No lymphadenopathy or thryomegaly appreciated. Cor: PMI nondisplaced. Regular rate & rhythm. No rubs, gallops or murmurs. Lungs: clear Abdomen: soft, nontender, nondistended. No hepatosplenomegaly. No bruits or masses. Good bowel sounds. Extremities: no cyanosis, clubbing, rash, edema Neuro: alert & orientedx3, cranial nerves grossly intact. moves all 4 extremities w/o  difficulty. Affect pleasant  ASSESSMENT & PLAN:  1. Chronic systolic HF:  due to peri-partum CM, onset 1999. S/P [employer] ICD 07/2016 - LHC 6/17 No CAD - EF 20-25% (12/17).  - Echo 8/18 EF 25-30% s/p ICD 3/18. [place] 3/19: RA 2, PA 28/13, Fick CO 5.7, CI 2.8 - CPX 12/18 showed moderate limitation due mostly to obesity with some HF component - Echo 11/22/17 LVEF 50-55%, Grade 1 DD, Mild MR, Mod LAE - Echo 04/2018 EF 20-25%. RV ok  - Echo 10/2019 EF < 20%. - Echo 08/2020 EF 20-25%, RV normal  - Echo today 06/25/19 25-30%  - Has Barostim 10/2020   - NYHA III. Volume status trending up. She has been out of jardiance 11 days. HL Score 33. Discussed heart Logic.  Appears volume overloaded.  -Will need to restart jardiance 10 mg daily. Given samples today. Starting patient assistance.  - Continue entesto 97-103 mg twice a day.  - Continue torsemide 20 mg daily and has asked her to take an extra 20 mg torsemide today and tomorrow.    - continue spiro to 25  mg daily at bed time.  - Continue Toprol XL 50 mg daily    - Continue corlanor 7.5 mg BID for now.  - Check BMET   2. Frequent PVCs/ Bigeminy - Holter Monitor 6/19 with 30% PVCs with one primary morphology.  - S/p PVC ablation 11/2017 with Dr Lovena Le unsuccessful - Off flecainide with EF down and persistent PVCs.  - On amio 200 daily - Zio Patch 04/2019 > 17% PVCs and >8% couplets.  - Zio Patch 11/28/2019 8.5 % PVCs - Zio 9/22 13% PVCs (unifocal)  - Per Dr. Lovena Le, due to the location of her PVCs, she is not a candidate for re-do ablation. Could consider referral to tertiary center, but success rate is not any more likely.  - Will try mexilitene 200 bid. If successful will try to wean amio. If not consider referral to Dr. Lenard Galloway to discuss ablation  3. Morbid obesity - s/p Gastric Sleeve at [facility] 09/20/17  -Body mass index is 35.54 kg/m.   4. Daytime Fatigue - Sleep study in 2019 and again in 2021 were both negative for sleep  apnea   5. DM2 - Restarting jardiance today.   6. Depression Recently started on mirtazapine  7. Covid Required hospitalization 7/22. Treated with antivirals.   F/U in 2-3 weeks to reaseess volume.     Darrick Grinder, NP 01/01/22

## 2022-01-01 NOTE — Progress Notes (Signed)
Medication Samples have been provided to the patient.  Drug name: Jardiance       Strength: 10 mg        Qty: 7  LOT: [id number]  Exp.Date: 09/2023  Dosing instructions: Patient takes 1 tablet by mouth daily.  Medication Samples have been provided to the patient.  Drug name: Jardiance       Strength: 10 mg        Qty: 14  LOT: [id number]  Exp.Date: 1/25  Dosing instructions: Patient takes 1 tablet by mouth daily. Samples Medication Samples have been provided to the patient.  Drug name: Jardiance       Strength: 10 mg        Qty: 7  LOT: [id number]  Exp.Date: 1/25  Dosing instructions: Patient takes 1 tablet by mouth daily.  The patient has been instructed regarding the correct time, dose, and frequency of taking this medication, including desired effects and most common side effects.   Tiney Rouge Hindy Perrault 12:54 PM 01/01/2022   The patient has been instructed regarding the correct time, dose, and frequency of taking this medication, including desired effects and most common side effects.   Tiney Rouge Kayliana Codd 12:51 PM 01/01/2022   The patient has been instructed regarding the correct time, dose, and frequency of taking this medication, including desired effects and most common side effects.   Tiney Rouge Ailanie Ruttan 12:50 PM 01/01/2022

## 2022-01-01 NOTE — Telephone Encounter (Signed)
Advanced Heart Failure Patient Advocate Encounter  The patient was approved for a [employer] grant that will help cover the cost of Entresto, Jardiance and Corlanor. Total amount awarded, $10,000. Eligibility, 12/02/21 - 12/02/22.  ID [id number]  BIN [id number]  PCN [id number]  Group [id number]  Patient was given a copy of the grant information to provide to the pharmacy. [employer] required copy of Medicare card to verify patient's Medicare status. Uploaded to patient's file.   Charlann Boxer, CPhT

## 2022-01-01 NOTE — Patient Instructions (Addendum)
RESTART Jardiance 10 mg one tab daily  INCREASE Torsemide to 40 mg daily for 2 days, then resume normal dose of 20 mg daily thereafter   A curtesy refill was sent for Albuterol inhaler. Please contact your PCP for additional refills   Labs today We will only contact you if something comes back abnormal or we need to make some changes. Otherwise no news is good news!  Your physician recommends that you schedule a follow-up appointment in: 3 weeks  in the Advanced Practitioners (PA/NP) Clinic    Do the following things EVERYDAY: Weigh yourself in the morning before breakfast. Write it down and keep it in a log. Take your medicines as prescribed Eat low salt foods--Limit salt (sodium) to 2000 mg per day.  Stay as active as you can everyday Limit all fluids for the day to less than 2 liters  At the [facility], you and your health needs are our priority. As part of our continuing mission to provide you with exceptional heart care, we have created designated Provider Care Teams. These Care Teams include your primary Cardiologist (physician) and Advanced Practice Providers (APPs- Physician Assistants and Nurse Practitioners) who all work together to provide you with the care you need, when you need it.   You may see any of the following providers on your designated Care Team at your next follow up: Dr Glori Bickers Dr Haynes Kerns, NP Lyda Jester, [place] [facility] [place], [place] Audry Riles, PharmD   Please be sure to bring in all your medications bottles to every appointment.

## 2022-01-03 ENCOUNTER — Other Ambulatory Visit: Payer: Self-pay | Admitting: Family Medicine

## 2022-01-03 ENCOUNTER — Encounter: Payer: Self-pay | Admitting: Family Medicine

## 2022-01-05 ENCOUNTER — Encounter: Payer: Self-pay | Admitting: Student

## 2022-01-05 ENCOUNTER — Other Ambulatory Visit: Payer: Self-pay

## 2022-01-05 ENCOUNTER — Ambulatory Visit (INDEPENDENT_AMBULATORY_CARE_PROVIDER_SITE_OTHER): Payer: HMO | Admitting: Student

## 2022-01-05 ENCOUNTER — Encounter: Payer: Self-pay | Admitting: Internal Medicine

## 2022-01-05 VITALS — BP 114/82 | HR 86 | Ht 65.0 in | Wt 208.0 lb

## 2022-01-05 DIAGNOSIS — I493 Ventricular premature depolarization: Secondary | ICD-10-CM

## 2022-01-05 DIAGNOSIS — I5042 Chronic combined systolic (congestive) and diastolic (congestive) heart failure: Secondary | ICD-10-CM | POA: Diagnosis not present

## 2022-01-05 DIAGNOSIS — Z95810 Presence of automatic (implantable) cardiac defibrillator: Secondary | ICD-10-CM | POA: Diagnosis not present

## 2022-01-05 DIAGNOSIS — I509 Heart failure, unspecified: Secondary | ICD-10-CM | POA: Diagnosis not present

## 2022-01-05 LAB — CUP PACEART INCLINIC DEVICE CHECK
Date Time Interrogation Session: 20230814094959
HighPow Impedance: 82 Ohm
Implantable Lead Implant Date: 20180312
Implantable Lead Location: 753860
Implantable Lead Model: 293
Implantable Lead Serial Number: 422842
Implantable Pulse Generator Implant Date: 20180312
Lead Channel Impedance Value: 652 Ohm
Lead Channel Pacing Threshold Amplitude: 1.4 V
Lead Channel Pacing Threshold Pulse Width: 0.4 ms
Lead Channel Sensing Intrinsic Amplitude: 16 mV
Lead Channel Setting Pacing Amplitude: 2.5 V
Lead Channel Setting Pacing Pulse Width: 0.4 ms
Lead Channel Setting Sensing Sensitivity: 0.5 mV
Pulse Gen Serial Number: 226601

## 2022-01-05 MED ORDER — TIZANIDINE HCL 2 MG PO TABS
2.0000 mg | ORAL_TABLET | Freq: Every day | ORAL | 0 refills | Status: DC
Start: 2022-01-05 — End: 2022-04-19

## 2022-01-05 MED ORDER — VITAMIN D (ERGOCALCIFEROL) 1.25 MG (50000 UNIT) PO CAPS: 50000.0000 [IU] | ORAL_CAPSULE | ORAL | 0 refills | Status: DC

## 2022-01-05 MED ORDER — LIDOCAINE 5 % EX PTCH
1.0000 | MEDICATED_PATCH | CUTANEOUS | 3 refills | Status: DC
Start: 2022-01-05 — End: 2022-10-23

## 2022-01-05 MED ORDER — METOPROLOL SUCCINATE ER 50 MG PO TB24: 75.0000 mg | ORAL_TABLET | Freq: Every day | ORAL | 3 refills | Status: DC

## 2022-01-05 NOTE — Patient Instructions (Signed)
Medication Instructions:  Your physician recommends that you continue on your current medications as directed. Please refer to the Current Medication list given to you today.  *If you need a refill on your cardiac medications before your next appointment, please call your pharmacy*   Lab Work: TODAY: BMET, ProBNP  If you have labs (blood work) drawn today and your tests are completely normal, you will receive your results only by: [place] (if you have MyChart) OR A paper copy in the mail If you have any lab test that is abnormal or we need to change your treatment, we will call you to review the results.   Follow-Up: At [facility], you and your health needs are our priority.  As part of our continuing mission to provide you with exceptional heart care, we have created designated Provider Care Teams.  These Care Teams include your primary Cardiologist (physician) and Advanced Practice Providers (APPs -  Physician Assistants and Nurse Practitioners) who all work together to provide you with the care you need, when you need it.   Your next appointment:   6-8 week(s)  The format for your next appointment:   In Person  Provider:   Cristopher Peru, MD

## 2022-01-06 LAB — BASIC METABOLIC PANEL
BUN/Creatinine Ratio: 11 (ref 9–23)
BUN: 11 mg/dL (ref 6–24)
CO2: 25 mmol/L (ref 20–29)
Calcium: 9.2 mg/dL (ref 8.7–10.2)
Chloride: 97 mmol/L (ref 96–106)
Creatinine, Ser: 0.99 mg/dL (ref 0.57–1.00)
Glucose: 104 mg/dL — ABNORMAL HIGH (ref 70–99)
Potassium: 3.7 mmol/L (ref 3.5–5.2)
Sodium: 137 mmol/L (ref 134–144)
eGFR: 71 mL/min/{1.73_m2} (ref >59)

## 2022-01-06 LAB — PRO B NATRIURETIC PEPTIDE: NT-Pro BNP: 324 pg/mL — ABNORMAL HIGH (ref 0–249)

## 2022-01-12 ENCOUNTER — Ambulatory Visit (INDEPENDENT_AMBULATORY_CARE_PROVIDER_SITE_OTHER): Payer: HMO

## 2022-01-12 DIAGNOSIS — Z95810 Presence of automatic (implantable) cardiac defibrillator: Secondary | ICD-10-CM

## 2022-01-12 DIAGNOSIS — I5042 Chronic combined systolic (congestive) and diastolic (congestive) heart failure: Secondary | ICD-10-CM

## 2022-01-12 NOTE — Progress Notes (Signed)
Remote ICD transmission.   

## 2022-01-13 NOTE — Progress Notes (Signed)
EPIC Encounter for ICM Monitoring  Patient Name: [name] is a 46 y.o. female Date: 01/13/2022 Primary Care Physican: Hoyt Koch, MD Primary Cardiologist: [place] Electrophysiologist: Caryl Comes 11/19/2021 Weight: 214 lbs                                                 Attempted call to patient and unable to reach.  Left detailed message per DPR regarding transmission. Transmission reviewed.  She has been taking Torsemide 40 mg per OV note on 8/10 and 8/14.   Heartlogic HF Index 34 suggesting possible fluid accumulation starting 8/6.  Thoracic impedance trending down.  Barostim implant 6/27.   Prescribed: Torsemide 20 mg Take 1 tablet  (20 mg total) by mouth daily.  Pt self adjusts Torsemide as needed. Potassium 20 mEq take 1 tablet daily Spironolactone 25 mg take 1 tablet (25 mg total) at bedtime.    Labs: 12/16/2021 Creatinine 1.01, BUN 9, Potassium 4.1, Sodium 134, GFR >60 12/15/2021 Creatinine 0.98, BUN 5, Potassium 4.3, Sodium 137, GFR >60  12/13/2021 Creatinine 0.89, BUN 6, Potassium 3.9, Sodium 137, GFR >60  09/29/2021 Creatinine 0.67, BUN 7, Potassium 3.9, Sodium 137, GFR >60 06/24/2021 Creatinine 0.83, BUN 9, Potassium 3.6, Sodium 139 A complete set of results can be found in Results Review.   Recommendations:  Left voice mail with ICM number and encouraged to call if experiencing any fluid symptoms.   Follow-up plan: ICM clinic phone appointment on 01/20/2022 to recheck fluid levels.  91 day device clinic remote transmission 03/18/2022.              EP/Cardiology next office visit:   02/27/2022 with Dr Lovena Le.  01/30/2022 with HF clinic.           Copy of ICM check sent to Dr. Caryl Comes.  Will send copy to Dr Haroldine Laws for review if patient is reached.  3 Month Trend    8 Day Data Trend          [name], RN 01/13/2022 8:46 AM

## 2022-01-15 DIAGNOSIS — R42 Dizziness and giddiness: Secondary | ICD-10-CM | POA: Diagnosis not present

## 2022-01-20 ENCOUNTER — Ambulatory Visit (INDEPENDENT_AMBULATORY_CARE_PROVIDER_SITE_OTHER): Payer: HMO

## 2022-01-20 ENCOUNTER — Telehealth: Payer: Self-pay

## 2022-01-20 ENCOUNTER — Encounter: Payer: Self-pay | Admitting: Cardiology

## 2022-01-20 DIAGNOSIS — I5042 Chronic combined systolic (congestive) and diastolic (congestive) heart failure: Secondary | ICD-10-CM

## 2022-01-20 DIAGNOSIS — Z95810 Presence of automatic (implantable) cardiac defibrillator: Secondary | ICD-10-CM

## 2022-01-20 NOTE — Telephone Encounter (Signed)
Attempted call to patient to request remote transmission.  Left message to send manual transmission.

## 2022-01-21 ENCOUNTER — Telehealth: Payer: Self-pay

## 2022-01-21 DIAGNOSIS — Z87898 Personal history of other specified conditions: Secondary | ICD-10-CM | POA: Diagnosis not present

## 2022-01-21 DIAGNOSIS — Z09 Encounter for follow-up examination after completed treatment for conditions other than malignant neoplasm: Secondary | ICD-10-CM | POA: Diagnosis not present

## 2022-01-21 NOTE — Progress Notes (Signed)
EPIC Encounter for ICM Monitoring  Patient Name: [name] is a 46 y.o. female Date: 01/21/2022 Primary Care Physican: Hoyt Koch, MD Primary Cardiologist: [place] Electrophysiologist: Caryl Comes 11/19/2021 Weight: 214 lbs                                                 Attempted call to patient and unable to reach.  Left detailed message per DPR regarding transmission. Transmission reviewed.    Heartlogic HF Index decreased from 40 to 28 suggesting possible fluid accumulation starting 8/6.  Thoracic impedance trending down.  Barostim implant 6/27.   Prescribed: Torsemide 20 mg Take 1 tablet  (20 mg total) by mouth daily.  Pt self adjusts Torsemide as needed. Potassium 20 mEq take 1 tablet daily Spironolactone 25 mg take 1 tablet (25 mg total) at bedtime.    Labs: 01/05/2022 Creatinine 0.99, BUN 11, Potassium 3.7, Sodium 137 01/01/2022 Creatinine 0.80, BUN 6,   Potassium 4.5, Sodium 136, GFR >60 12/16/2021 Creatinine 1.01, BUN 9,   Potassium 4.1, Sodium 134, GFR >60 12/15/2021 Creatinine 0.98, BUN 5,   Potassium 4.3, Sodium 137, GFR >60  12/13/2021 Creatinine 0.89, BUN 6,   Potassium 3.9, Sodium 137, GFR >60  09/29/2021 Creatinine 0.67, BUN 7,   Potassium 3.9, Sodium 137, GFR >60 06/24/2021 Creatinine 0.83, BUN 9,   Potassium 3.6, Sodium 139 A complete set of results can be found in Results Review.   Recommendations:   Left voice mail with ICM number and encouraged to call if experiencing any fluid symptoms.   Follow-up plan: ICM clinic phone appointment on 01/27/2022.  91 day device clinic remote transmission 03/18/2022.              EP/Cardiology next office visit:   02/27/2022 with Dr Lovena Le.  01/30/2022 with HF clinic.           Copy of ICM check sent to Dr. Caryl Comes.  Will send copy to Dr Haroldine Laws for review if patient is reached.  3 Month Trend    8 Day Data Trend          [name], RN 01/21/2022 9:25 AM

## 2022-01-21 NOTE — Telephone Encounter (Signed)
Remote ICM transmission received.  Attempted call to patient regarding ICM remote transmission and left detailed message per DPR.  Advised to return call for any fluid symptoms or questions. Next ICM remote transmission scheduled 01/27/2022.    

## 2022-01-27 ENCOUNTER — Telehealth: Payer: Self-pay

## 2022-01-27 ENCOUNTER — Ambulatory Visit (INDEPENDENT_AMBULATORY_CARE_PROVIDER_SITE_OTHER): Payer: HMO

## 2022-01-27 DIAGNOSIS — I5042 Chronic combined systolic (congestive) and diastolic (congestive) heart failure: Secondary | ICD-10-CM

## 2022-01-27 DIAGNOSIS — Z95810 Presence of automatic (implantable) cardiac defibrillator: Secondary | ICD-10-CM

## 2022-01-27 NOTE — Telephone Encounter (Signed)
Remote ICM transmission received.  Attempted call to patient regarding ICM remote transmission and left detailed message per DPR.  Advised to return call for any fluid symptoms or questions. Next ICM remote transmission scheduled 02/02/2022.    

## 2022-01-27 NOTE — Progress Notes (Signed)
EPIC Encounter for ICM Monitoring  Patient Name: [name] is a 46 y.o. female Date: 01/27/2022 Primary Care Physican: Hoyt Koch, MD Primary Cardiologist: [place] Electrophysiologist: Caryl Comes 11/19/2021 Weight: 214 lbs                                                 Attempted call to patient and unable to reach.  Left detailed message per DPR regarding transmission. Transmission reviewed.    Heartlogic HF Index increased from 28 to 34 suggesting possible fluid accumulation starting 8/6.    Barostim implant 6/27.   Prescribed: Torsemide 20 mg Take 1 tablet  (20 mg total) by mouth daily.  Pt self adjusts Torsemide as needed. Potassium 20 mEq take 1 tablet daily Spironolactone 25 mg take 1 tablet (25 mg total) at bedtime.    Labs: 01/05/2022 Creatinine 0.99, BUN 11, Potassium 3.7, Sodium 137 01/01/2022 Creatinine 0.80, BUN 6,   Potassium 4.5, Sodium 136, GFR >60 12/16/2021 Creatinine 1.01, BUN 9,   Potassium 4.1, Sodium 134, GFR >60 12/15/2021 Creatinine 0.98, BUN 5,   Potassium 4.3, Sodium 137, GFR >60  12/13/2021 Creatinine 0.89, BUN 6,   Potassium 3.9, Sodium 137, GFR >60  09/29/2021 Creatinine 0.67, BUN 7,   Potassium 3.9, Sodium 137, GFR >60 06/24/2021 Creatinine 0.83, BUN 9,   Potassium 3.6, Sodium 139 A complete set of results can be found in Results Review.   Recommendations:   Left voice mail with ICM number and encouraged to call if experiencing any fluid symptoms.   Follow-up plan: ICM clinic phone appointment on 01/27/2022.  91 day device clinic remote transmission 03/18/2022.              EP/Cardiology next office visit:   02/27/2022 with Dr Lovena Le.  01/30/2022 with HF clinic.           Copy of ICM check sent to Dr. Caryl Comes.  Will send copy to Dr Haroldine Laws for review if patient is reached.  3 Month Trend    8 Day Data Trend          [name], RN 01/27/2022 4:29 PM

## 2022-01-30 ENCOUNTER — Inpatient Hospital Stay (HOSPITAL_COMMUNITY)
Admission: RE | Admit: 2022-01-30 | Discharge: 2022-01-30 | Disposition: A | Discharge status: Home / Self Care | Payer: HMO | Source: Ambulatory Visit

## 2022-01-30 ENCOUNTER — Encounter (HOSPITAL_COMMUNITY): Payer: Self-pay

## 2022-02-02 ENCOUNTER — Telehealth: Payer: Self-pay

## 2022-02-02 ENCOUNTER — Ambulatory Visit (INDEPENDENT_AMBULATORY_CARE_PROVIDER_SITE_OTHER): Payer: HMO

## 2022-02-02 DIAGNOSIS — Z95810 Presence of automatic (implantable) cardiac defibrillator: Secondary | ICD-10-CM

## 2022-02-02 DIAGNOSIS — I209 Angina pectoris, unspecified: Secondary | ICD-10-CM | POA: Diagnosis not present

## 2022-02-02 DIAGNOSIS — I5042 Chronic combined systolic (congestive) and diastolic (congestive) heart failure: Secondary | ICD-10-CM

## 2022-02-02 DIAGNOSIS — I5040 Unspecified combined systolic (congestive) and diastolic (congestive) heart failure: Secondary | ICD-10-CM | POA: Diagnosis not present

## 2022-02-02 DIAGNOSIS — I110 Hypertensive heart disease with heart failure: Secondary | ICD-10-CM | POA: Diagnosis not present

## 2022-02-02 NOTE — Telephone Encounter (Signed)
Remote ICM transmission received.  Attempted call to patient regarding ICM remote transmission and left detailed message per DPR.  Advised to return call for any fluid symptoms or questions. Next ICM remote transmission scheduled 02/09/2022.

## 2022-02-02 NOTE — Progress Notes (Signed)
EPIC Encounter for ICM Monitoring  Patient Name: [name] is a 46 y.o. female Date: 02/02/2022 Primary Care Physican: Hoyt Koch, MD Primary Cardiologist: [place] Electrophysiologist: Caryl Comes 11/19/2021 Weight: 214 lbs                                                 Attempted call to patient and unable to reach.  Left detailed message per DPR regarding transmission. Transmission reviewed.    Heartlogic HF Index continues to rise possibly indicating worsening fluid accumulation that started 8/6.  Index increased from 34 to 42.  Thoracic impedance trending low.    Barostim implant 6/27.   Prescribed: Torsemide 20 mg Take 1 tablet  (20 mg total) by mouth daily.  Pt self adjusts Torsemide as needed. Potassium 20 mEq take 1 tablet daily Spironolactone 25 mg take 1 tablet (25 mg total) at bedtime.    Labs: 01/05/2022 Creatinine 0.99, BUN 11, Potassium 3.7, Sodium 137 01/01/2022 Creatinine 0.80, BUN 6,   Potassium 4.5, Sodium 136, GFR >60 12/16/2021 Creatinine 1.01, BUN 9,   Potassium 4.1, Sodium 134, GFR >60 12/15/2021 Creatinine 0.98, BUN 5,   Potassium 4.3, Sodium 137, GFR >60  12/13/2021 Creatinine 0.89, BUN 6,   Potassium 3.9, Sodium 137, GFR >60  09/29/2021 Creatinine 0.67, BUN 7,   Potassium 3.9, Sodium 137, GFR >60 06/24/2021 Creatinine 0.83, BUN 9,   Potassium 3.6, Sodium 139 A complete set of results can be found in Results Review.   Recommendations:   Left voice mail with ICM number and encouraged to call if experiencing any fluid symptoms.  Advised to call Dr Bensimhon's office if she is experiecing any fluid symptoms.   Follow-up plan: ICM clinic phone appointment on 02/09/2022 to recheck fluid levels.  91 day device clinic remote transmission 03/18/2022.              EP/Cardiology next office visit:   02/27/2022 with Dr Lovena Le.             Copy of ICM check sent to Dr. Caryl Comes and Dr Haroldine Laws for review if patient is reached.  3 Month Trend    8 Day Data  Trend          [name], RN 02/02/2022 8:15 AM

## 2022-02-09 ENCOUNTER — Ambulatory Visit (INDEPENDENT_AMBULATORY_CARE_PROVIDER_SITE_OTHER): Payer: HMO

## 2022-02-09 DIAGNOSIS — I5042 Chronic combined systolic (congestive) and diastolic (congestive) heart failure: Secondary | ICD-10-CM

## 2022-02-09 DIAGNOSIS — Z95810 Presence of automatic (implantable) cardiac defibrillator: Secondary | ICD-10-CM

## 2022-02-11 ENCOUNTER — Telehealth: Payer: Self-pay

## 2022-02-11 NOTE — Telephone Encounter (Signed)
Remote ICM transmission received.  Attempted call to patient regarding ICM remote transmission and left detailed message per DPR.  Advised to return call for any fluid symptoms or questions. Next ICM remote transmission scheduled 03/09/2022.

## 2022-02-11 NOTE — Progress Notes (Signed)
EPIC Encounter for ICM Monitoring  Patient Name: [name] is a 46 y.o. female Date: 02/11/2022 Primary Care Physican: Hoyt Koch, MD Primary Cardiologist: [place] Electrophysiologist: Caryl Comes 11/19/2021 Weight: 214 lbs                                                 Attempted call to patient and unable to reach.  Left detailed message per DPR regarding transmission. Transmission reviewed.    Heartlogic HF Index decreased from 42 to 10.  Thoracic impedance trending low.    Barostim implant 6/27.   Prescribed: Torsemide 20 mg Take 1 tablet  (20 mg total) by mouth daily.  Pt self adjusts Torsemide as needed. Potassium 20 mEq take 1 tablet daily Spironolactone 25 mg take 1 tablet (25 mg total) at bedtime.    Labs: 01/05/2022 Creatinine 0.99, BUN 11, Potassium 3.7, Sodium 137 01/01/2022 Creatinine 0.80, BUN 6,   Potassium 4.5, Sodium 136, GFR >60 12/16/2021 Creatinine 1.01, BUN 9,   Potassium 4.1, Sodium 134, GFR >60 12/15/2021 Creatinine 0.98, BUN 5,   Potassium 4.3, Sodium 137, GFR >60  12/13/2021 Creatinine 0.89, BUN 6,   Potassium 3.9, Sodium 137, GFR >60  09/29/2021 Creatinine 0.67, BUN 7,   Potassium 3.9, Sodium 137, GFR >60 06/24/2021 Creatinine 0.83, BUN 9,   Potassium 3.6, Sodium 139 A complete set of results can be found in Results Review.   Recommendations:   Left voice mail with ICM number and encouraged to call if experiencing any fluid symptoms.     Follow-up plan: ICM clinic phone appointment on 03/09/2022.  91 day device clinic remote transmission 03/18/2022.              EP/Cardiology next office visit:   02/27/2022 with Dr Lovena Le.             Copy of ICM check sent to Dr. Caryl Comes.  3 Month Trend    8 Day Data Trend          [name], RN 02/11/2022 2:09 PM

## 2022-02-14 ENCOUNTER — Other Ambulatory Visit (HOSPITAL_COMMUNITY): Payer: Self-pay | Admitting: Internal Medicine

## 2022-02-16 ENCOUNTER — Ambulatory Visit
Admission: EM | Admit: 2022-02-16 | Discharge: 2022-02-16 | Disposition: A | Discharge status: Home / Self Care | Payer: HMO | Attending: Physician Assistant | Admitting: Physician Assistant

## 2022-02-16 DIAGNOSIS — H1031 Unspecified acute conjunctivitis, right eye: Secondary | ICD-10-CM

## 2022-02-16 MED ORDER — POLYMYXIN B-TRIMETHOPRIM 10000-0.1 UNIT/ML-% OP SOLN: 1.0000 [drp] | OPHTHALMIC | 0 refills | Status: AC

## 2022-02-16 NOTE — ED Provider Notes (Signed)
[facility]    CSN: [id number] Arrival date & time: 02/16/22  1245      History   Chief Complaint Chief Complaint  Patient presents with   Eye Pain    HPI [name] is a 46 y.o. female.   Patient here today for evaluation of right eye pain and drainage after she was accidentally hit in the eye about glitter round stone from fingernail yesterday.  She reports that pain has improved since initial injury.  She notes last night she did have some tearing but this has also resolved.  She denies any vision changes.  She does wear contacts at times but has not been wearing any since accident.  The history is provided by the patient.  Eye Pain    Past Medical History:  Diagnosis Date   Acute on chronic systolic (congestive) heart failure (HCC) 04/29/2017   Acute pain of right shoulder 06/16/2017   AICD (automatic cardioverter/defibrillator) present    Anginal pain (HCC)    Anxiety    Arthritis    right shoulder    Asthma    CHF (congestive heart failure) ([place])    Chronic combined systolic and diastolic heart failure ([place]) 03/05/2014   Closed low lateral malleolus fracture 10/23/2013   Cystitis 10/21/2017   Depression    Depression with anxiety 01/20/2013   Diabetes mellitus without complication (HCC)    Diverticulosis    Dyspnea    comes and goes intermittently mostly with exertion    Dysrhythmia    Essential hypertension    Prev followed by [name]    Fibroid    age 89   Gallstones    Generalized abdominal cramping    History of cardiomyopathy    Hypertension    IBS (irritable bowel syndrome)    ICD (implantable cardioverter-defibrillator), single, 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 CPAP intolerance >> "smothering" Good tolerance of auto device (optimal pressure 12-13 on download).  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   PVC (premature ventricular contraction) 06/23/2016   Seizures ([place])    "as a child" (08/03/2016)   Termination of pregnancy    due to cardiac risk    Patient Active Problem List   Diagnosis Date Noted   COVID-19 virus infection 12/14/2021   Near syncope 12/14/2021   Right wrist pain 04/11/2021   Birth control counseling 02/28/2021   Obesity 02/28/2021   Greater trochanteric bursitis of right hip 12/26/2020   Congestive heart failure, NYHA class III ([place]) 11/08/2020   Asthma 08/25/2020   Iron deficiency anemia due to chronic blood loss 08/06/2020   Greater trochanteric bursitis, left 07/01/2020   V-tach ([place]) 11/12/2019   Asthma with acute exacerbation 09/14/2019   Left hip pain 03/21/2019   Headache 10/26/2018   Chronic right-sided low back pain without  sciatica 06/24/2018   Trigger point of shoulder region, right 06/13/2018   Patellar subluxation, left, initial encounter 04/25/2018   Left flank pain 01/14/2018   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   Myofascial pain 06/16/2017   Insomnia 05/07/2017   History of cardiomyopathy    Acute on chronic systolic (congestive) heart failure ([place]) 04/29/2017   Nonspecific chest pain 04/28/2017   ICD (implantable cardioverter-defibrillator), single, 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   Patellofemoral syndrome of both knees 10/16/2016   NICM (nonischemic cardiomyopathy) ([place]) 08/03/2016   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   Chest pain  11/09/2015   Labral tear of shoulder 04/04/2015   Chronic combined systolic and diastolic heart failure ([place]) 03/05/2014   IBS (irritable bowel syndrome)    MDD (major depressive disorder) 01/20/2013   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   Essential hypertension     Past Surgical History:  Procedure Laterality Date   CARDIAC CATHETERIZATION N/A 11/11/2015   Procedure: Right/Left Heart Cath and Coronary Angiography;  Surgeon: Troy Sine, MD;  Location: [facility];  Service: Cardiovascular;  Laterality: N/A;   CARDIAC CATHETERIZATION  ~ 2015   CARDIAC DEFIBRILLATOR PLACEMENT  08/03/2016   CESAREAN SECTION  1999   COLONOSCOPY WITH PROPOFOL N/A 04/21/2016   Procedure: COLONOSCOPY WITH PROPOFOL;  Surgeon: Jerene Bears, MD;  Location: [facility];  Service: Gastroenterology;  Laterality: N/A;   ESOPHAGOGASTRODUODENOSCOPY (EGD) WITH PROPOFOL N/A 04/21/2016   Procedure: ESOPHAGOGASTRODUODENOSCOPY (EGD) WITH PROPOFOL;  Surgeon: Jerene Bears, MD;  Location: [facility];  Service: Gastroenterology;  Laterality: N/A;   FOOT FRACTURE SURGERY Right ~ 2003   FRACTURE SURGERY     ICD IMPLANT N/A 08/03/2016   Procedure: ICD Implant;  Surgeon: Deboraha Sprang, MD;  Location: [facility];  Service: Cardiovascular;  Laterality: N/A;   LAPAROSCOPIC CHOLECYSTECTOMY  12/2006   LAPAROSCOPIC GASTRIC SLEEVE RESECTION     LAPAROSCOPY ABDOMEN DIAGNOSTIC  2008   "cut bile duct w/gallbladder OR; had to go in later & fix leak; hospitalized for 2 months"   LEFT HEART CATHETERIZATION WITH CORONARY ANGIOGRAM N/A 02/26/2014   Procedure: LEFT HEART CATHETERIZATION WITH CORONARY ANGIOGRAM;  Surgeon: Jettie Booze, MD;  Location: [facility];  Service: Cardiovascular;  Laterality: N/A;   OVARIAN CYST REMOVAL Right 1999   PVC ABLATION N/A 12/01/2017   Procedure: PVC ABLATION;  Surgeon: Evans Lance, MD;  Location: [facility];  Service: Cardiovascular;  Laterality: N/A;   RIGHT HEART CATH  N/A 08/05/2017   Procedure: RIGHT HEART CATH;  Surgeon: Jolaine Artist, MD;  Location: [facility];  Service: Cardiovascular;  Laterality: N/A;   RIGHT HEART CATH N/A 11/16/2019   Procedure: RIGHT HEART CATH;  Surgeon: Jolaine Artist, MD;  Location: [facility];  Service: Cardiovascular;  Laterality: N/A;   TUBAL LIGATION  1999    OB History     Gravida  2   Para  1   Term      Preterm      AB  1   Living  1      SAB      IAB  1   Ectopic      Multiple      Live Births               Home Medications    Prior to Admission medications   Medication Sig Start Date End Date Taking? Authorizing Provider  trimethoprim-polymyxin b (POLYTRIM) ophthalmic solution Place 1 drop  into the right eye every 4 (four) hours for 7 days. 02/16/22 02/23/22 Yes Francene Finders, PA-C  acetaminophen (TYLENOL) 650 MG CR tablet Take 650 mg by mouth every 8 (eight) hours as needed for pain (headache).    [provider]  albuterol (PROAIR HFA) 108 (90 Base) MCG/ACT inhaler Inhale 1-2 puffs into the lungs every 6 (six) hours as needed for wheezing or shortness of breath. Follow-up appt is due must see provider for future refills 01/01/22   Darrick Grinder D, NP  amiodarone (PACERONE) 200 MG tablet Take 1 tablet (200 mg total) by mouth daily. 02/04/21   Shirley Friar, PA-C  budesonide-formoterol (SYMBICORT) 160-4.5 MCG/ACT inhaler Inhale 2 puffs into the lungs 2 (two) times daily as needed. Follow-up appt is due must see provider for future refills 02/17/21   Hoyt Koch, MD  CORLANOR 7.5 MG TABS tablet TAKE 1 TABLET BY MOUTH TWICE DAILY WITH A MEAL 02/16/22   Bensimhon, Shaune Pascal, MD  dicyclomine (BENTYL) 20 MG tablet Take 1 tablet (20 mg total) by mouth daily as needed for spasms. 05/30/20   Hoyt Koch, MD  empagliflozin (JARDIANCE) 10 MG TABS tablet Take 1 tablet (10 mg total) by mouth daily. 11/20/21   Bensimhon, Shaune Pascal, MD  fluticasone  (FLONASE) 50 MCG/ACT nasal spray Place 2 sprays into both nostrils daily as needed for allergies. Follow-up appt is due must see provider for future refills 02/17/21   Hoyt Koch, MD  gabapentin (NEURONTIN) 300 MG capsule Take 1 capsule (300 mg total) by mouth at bedtime as needed (back spasms). 05/30/20   Hoyt Koch, MD  lidocaine (LIDODERM) 5 % Place 1 patch onto the skin daily. Remove & Discard patch within 12 hours or as directed by MD 01/05/22   Hoyt Koch, MD  Magnesium Oxide 200 MG TABS Take 1 tablet (200 mg total) by mouth daily. 05/30/20   Bensimhon, Shaune Pascal, MD  metoprolol succinate (TOPROL-XL) 50 MG 24 hr tablet Take 1.5 tablets (75 mg total) by mouth daily. Take with or immediately following a meal. 01/05/22   Tillery, Satira Mccallum, PA-C  mexiletine (MEXITIL) 200 MG capsule Take 1 capsule (200 mg total) by mouth 2 (two) times daily. 06/24/21   Bensimhon, Shaune Pascal, MD  Multiple Vitamins-Minerals (MULTIVITAMIN GUMMIES WOMENS PO) Take 2 tablets by mouth daily.    [provider]  nitroGLYCERIN (NITROSTAT) 0.4 MG SL tablet Place 1 tablet (0.4 mg total) under the tongue every 5 (five) minutes as needed for chest pain. 06/20/21   Bensimhon, Shaune Pascal, MD  potassium chloride SA (KLOR-CON M) 20 MEQ tablet Take 1 tablet (20 mEq total) by mouth daily. 01/01/22   Larey Dresser, MD  sacubitril-valsartan (ENTRESTO) 97-103 MG Take 1 tablet by mouth 2 (two) times daily. 09/29/21   Bensimhon, Shaune Pascal, MD  spironolactone (ALDACTONE) 25 MG tablet Take 1 tablet (25 mg total) by mouth at bedtime. 01/14/21   Bensimhon, Shaune Pascal, MD  tiZANidine (ZANAFLEX) 2 MG tablet Take 1 tablet (2 mg total) by mouth at bedtime. 01/05/22   Lyndal Pulley, DO  torsemide (DEMADEX) 20 MG tablet Take 20 mg by mouth daily.    [provider]  Ubrogepant (UBRELVY) 100 MG TABS Take 100 mg by mouth daily as needed (migraine). 06/11/21   Hoyt Koch, MD  Vitamin D, Ergocalciferol,  (DRISDOL) 1.25 MG (50000 UNIT) CAPS capsule Take 1 capsule (50,000 Units total) by mouth every 7 (seven) days. 01/05/22  Hoyt Koch, MD    Family History Family History  Problem Relation Age of Onset   Emphysema Maternal Grandmother        smoked   Heart disease Maternal Grandmother 46       MI   Rheum arthritis Mother    Allergies Daughter    Colon cancer Neg Hx     Social History Social History   Tobacco Use   Smoking status: Never   Smokeless tobacco: Never  Vaping Use   Vaping Use: Never used  Substance Use Topics   Alcohol use: No   Drug use: No     Allergies   Vancomycin, Aspirin, Contrast media [iodinated contrast media], Ciprofloxacin, Cyclobenzaprine, Farxiga [dapagliflozin], and Sulfa antibiotics   Review of Systems Review of Systems  Constitutional:  Negative for chills and fever.  Eyes:  Positive for pain, discharge (tearing) and redness. Negative for photophobia and visual disturbance.  Gastrointestinal:  Negative for nausea and vomiting.     Physical Exam Triage Vital Signs ED Triage Vitals  Enc Vitals Group     BP 02/16/22 1401 (!) 137/95     Pulse Rate 02/16/22 1401 93     Resp 02/16/22 1401 18     Temp 02/16/22 1401 98.1 F (36.7 C)     Temp Source 02/16/22 1401 Oral     SpO2 02/16/22 1401 97 %     Weight --      Height --      Head Circumference --      Peak Flow --      Pain Score 02/16/22 1404 5     Pain Loc --      Pain Edu? --      Excl. in [place]? --    No data found.  Updated Vital Signs BP (!) 137/95 (BP Location: Left Arm)   Pulse 93   Temp 98.1 F (36.7 C) (Oral)   Resp 18   SpO2 97%   Physical Exam Vitals and nursing note reviewed.  Constitutional:      General: She is not in acute distress.    Appearance: Normal appearance. She is not ill-appearing.  HENT:     Head: Normocephalic and atraumatic.  Eyes:     Extraocular Movements: Extraocular movements intact.     Pupils: Pupils are equal, round, and  reactive to light.     Comments: Mild conjunctival injection to lateral right eye, no conjunctival injection to left eye  Cardiovascular:     Rate and Rhythm: Normal rate.  Pulmonary:     Effort: Pulmonary effort is normal.  Neurological:     Mental Status: She is alert.  Psychiatric:        Mood and Affect: Mood normal.        Behavior: Behavior normal.        Thought Content: Thought content normal.      UC Treatments / Results  Labs (all labs ordered are listed, but only abnormal results are displayed) Labs Reviewed - No data to display  EKG   Radiology No results found.  Procedures Procedures (including critical care time)  Medications Ordered in UC Medications - No data to display  Initial Impression / Assessment and Plan / UC Course  I have reviewed the triage vital signs and the nursing notes.  Pertinent labs & imaging results that were available during my care of the patient were reviewed by me and considered in my medical decision making (see chart for details).  Will treat to cover conjunctivitis vs very mild corneal abrasion with antibiotic drops. Encouraged follow up if no continued improvement or with any further concerns.   Final Clinical Impressions(s) / UC Diagnoses   Final diagnoses:  Acute conjunctivitis of right eye, unspecified acute conjunctivitis type   Discharge Instructions   None    ED Prescriptions     Medication Sig Dispense Auth. Provider   trimethoprim-polymyxin b (POLYTRIM) ophthalmic solution Place 1 drop into the right eye every 4 (four) hours for 7 days. 10 mL Francene Finders, PA-C      PDMP not reviewed this encounter.   Francene Finders, PA-C 02/16/22 1505

## 2022-02-16 NOTE — ED Triage Notes (Signed)
Pt presents with right eye pain and drainage after a friends glitter rhinestone popped her in the eye yesterday.

## 2022-02-26 ENCOUNTER — Encounter: Payer: Self-pay | Admitting: Internal Medicine

## 2022-02-27 ENCOUNTER — Encounter: Payer: HMO | Admitting: Internal Medicine

## 2022-03-02 ENCOUNTER — Ambulatory Visit (INDEPENDENT_AMBULATORY_CARE_PROVIDER_SITE_OTHER): Payer: HMO | Admitting: Family Medicine

## 2022-03-02 VITALS — BP 118/72 | HR 102 | Wt 208.0 lb

## 2022-03-02 DIAGNOSIS — M9908 Segmental and somatic dysfunction of rib cage: Secondary | ICD-10-CM

## 2022-03-02 DIAGNOSIS — M9904 Segmental and somatic dysfunction of sacral region: Secondary | ICD-10-CM | POA: Diagnosis not present

## 2022-03-02 DIAGNOSIS — M9902 Segmental and somatic dysfunction of thoracic region: Secondary | ICD-10-CM

## 2022-03-02 DIAGNOSIS — M533 Sacrococcygeal disorders, not elsewhere classified: Secondary | ICD-10-CM | POA: Diagnosis not present

## 2022-03-02 DIAGNOSIS — M9901 Segmental and somatic dysfunction of cervical region: Secondary | ICD-10-CM

## 2022-03-02 DIAGNOSIS — M9903 Segmental and somatic dysfunction of lumbar region: Secondary | ICD-10-CM

## 2022-03-02 NOTE — Progress Notes (Unsigned)
[place] Phone: [phone] Subjective:    I'm seeing this patient by the request  of:  Hoyt Koch, MD  CC: Back and neck pain follow-up  [id number]  [name] is a 46 y.o. female coming in with complaint of back and neck pain. OMT 04/29/2021. Patient states more pain over the right sacroiliac joint.  States that it is starting to affect even sleep sometimes.  Medications patient has been prescribed:   Taking:         Reviewed prior external information including notes and imaging from previsou exam, outside providers and external EMR if available.   As well as notes that were available from care everywhere and other healthcare systems.  Past medical history, social, surgical and family history all reviewed in electronic medical record.  No pertanent information unless stated regarding to the chief complaint.   Past Medical History:  Diagnosis Date   Acute on chronic systolic (congestive) heart failure (HCC) 04/29/2017   Acute pain of right shoulder 06/16/2017   AICD (automatic cardioverter/defibrillator) present    Anginal pain (HCC)    Anxiety    Arthritis    right shoulder    Asthma    CHF (congestive heart failure) ([place])    Chronic combined systolic and diastolic heart failure ([place]) 03/05/2014   Closed low lateral malleolus fracture 10/23/2013   Cystitis 10/21/2017   Depression    Depression with anxiety 01/20/2013   Diabetes mellitus without complication (HCC)    Diverticulosis    Dyspnea    comes and goes intermittently mostly with exertion    Dysrhythmia    Essential hypertension    Prev followed by [facility]/ Cardiology    Fibroid    age 27   Gallstones    Generalized abdominal cramping    History of cardiomyopathy    Hypertension    IBS (irritable bowel syndrome)    ICD (implantable cardioverter-defibrillator), single, 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 CPAP intolerance >> "smothering" Good tolerance of auto device (optimal pressure 12-13 on download).  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   Aspirin Other (See Comments)    Wheezing, (Pt states that she just wheezes some when she takes aspirin by itself but she can take aspirin in a combination product).   Contrast Media [Iodinated Contrast Media] Other (See Comments)    Multiple CT contrast studies done over 2 weeks caused ARF   Ciprofloxacin Itching  and Rash   Cyclobenzaprine Other (See Comments)    Can not tolerate   [place] [Dapagliflozin] Rash   Sulfa Antibiotics Itching and Rash     Review of Systems:  No headache, visual changes, nausea, vomiting, diarrhea, constipation, dizziness, abdominal pain, skin rash, fevers, chills, night sweats, weight loss, swollen lymph nodes, body aches, joint swelling, chest pain, shortness of breath, mood changes. POSITIVE muscle aches  Objective  Blood pressure 118/72, pulse (!) 102, weight 208 lb (94.3 kg), SpO2 97 %.   General: No apparent  distress alert and oriented x3 mood and affect normal, dressed appropriately.  HEENT: Pupils equal, extraocular movements intact  Respiratory: [name] speak in full sentences and does not appear short of breath  Cardiovascular: No lower extremity edema, non tender, no erythema  Gait MSK:  Back does have some loss of lordosis.  Positive Faber test noted.  Severe tenderness over the sacroiliac joint with potential swelling of the joint noted.  No erythema of the skin.  Patient does have significant difficulty with the FABER but not as much with the straight leg test.  Osteopathic findings  C6 flexed rotated and side bent left T4 extended rotated and side bent right inhaled rib T8 extended rotated and side bent left L2 flexed rotated and side bent right Sacrum right on right  After verbal consent patient was prepped with alcohol swab and with a 21-gauge 2 inch needle patient was injected in the right sacroiliac joint.  Total of 2 cc of 0.5% Marcaine and 1 cc of Kenalog 40 mg/mL used.  No blood loss.  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  Discussed gabapentin 300 mg at night.  We discussed with patient about the osteopathic manipulation which was helpful as well.  Given injection and had significant improvement immediately.  Follow-up again in 6 to 8 weeks    Nonallopathic problems  Decision today to treat with OMT was based on Physical Exam  After verbal consent patient was treated with HVLA, ME, FPR techniques in cervical, rib, thoracic, lumbar, and sacral  areas  Patient tolerated the procedure well with improvement in symptoms  Patient given exercises, stretches and lifestyle modifications  See medications in patient instructions if given  Patient will follow up in 4-8 weeks     The above documentation has been reviewed and is accurate and complete Lyndal Pulley, DO          Note: This dictation was prepared with Dragon dictation along with smaller phrase technology. Any transcriptional errors that result from this process are unintentional.

## 2022-03-02 NOTE — Patient Instructions (Addendum)
SI joint injection today Do prescribed exercises at least 3x a week

## 2022-03-02 NOTE — Progress Notes (Unsigned)
[place] [name]: [phone] Subjective:    I'm seeing this patient by the request  of:  Hoyt Koch, MD  CC:   [id number]  04/29/2021 Chronic, with mild exacerbation.  Attempted osteopathic manipulation with some improvement.  Mainly think it is more the sacroiliac joint and does have the underlying greater trochanteric bursitis we will continue to monitor.  Discussed the topical anti-inflammatories.  Discussed Tylenol.  Patient is avoiding oral anti-inflammatories were possible.  Discussed icing regimen.  Follow-up with me again in 6 to 8 weeks  Updated 03/02/2022 [name] is a 46 y.o. female coming in with complaint of right hip and low back pain. Right side pain over SI joint. Flared up about a month ago and hasn't gotten better. Tried using interventions, but only temporary relief. Pain has become constant all day. No other complaints       Past Medical History:  Diagnosis Date   Acute on chronic systolic (congestive) heart failure (HCC) 04/29/2017   Acute pain of right shoulder 06/16/2017   AICD (automatic cardioverter/defibrillator) present    Anginal pain (HCC)    Anxiety    Arthritis    right shoulder    Asthma    CHF (congestive heart failure) ([place])    Chronic combined systolic and diastolic heart failure ([place]) 03/05/2014   Closed low lateral malleolus fracture 10/23/2013   Cystitis 10/21/2017   Depression    Depression with anxiety 01/20/2013   Diabetes mellitus without complication (HCC)    Diverticulosis    Dyspnea    comes and goes intermittently mostly with exertion    Dysrhythmia    Essential hypertension    Prev followed by [name]    Fibroid    age 19   Gallstones    Generalized abdominal cramping    History of cardiomyopathy    Hypertension    IBS (irritable bowel syndrome)    ICD (implantable cardioverter-defibrillator), single, in situ  12/14/2016   Insomnia 05/07/2017   Labral tear of shoulder 04/04/2015    Injected 04/04/2015 Injected 12/03/2015    Migraine    "monthly" (08/03/2016)   Myofascial pain 06/16/2017   NICM (nonischemic cardiomyopathy) ([place]) 08/03/2016   Nonallopathic lesion of lumbosacral region 11/16/2016   Nonallopathic lesion of sacral region 11/16/2016   Nonallopathic lesion of thoracic region 08/20/2014   Nonspecific chest pain 04/28/2017   OSA (obstructive sleep apnea) 01/02/2013   NPSG 2009:  AHI 9/hr. CPAP intolerance >> "smothering" Good tolerance of auto device (optimal pressure 12-13 on download).  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   PVC (premature ventricular contraction) 06/23/2016   Seizures ([place])    "as a child" (08/03/2016)   Termination of pregnancy    due to cardiac risk   Past Surgical History:  Procedure Laterality Date   CARDIAC CATHETERIZATION N/A 11/11/2015   Procedure: Right/Left Heart Cath and Coronary Angiography;  Surgeon: Troy Sine, MD;  Location: [facility];  Service: Cardiovascular;  Laterality: N/A;   CARDIAC CATHETERIZATION  ~ 2015   CARDIAC DEFIBRILLATOR PLACEMENT  08/03/2016   CESAREAN SECTION  1999   COLONOSCOPY WITH PROPOFOL N/A 04/21/2016   Procedure: COLONOSCOPY WITH PROPOFOL;  Surgeon: Jerene Bears, MD;  Location: [facility];  Service: Gastroenterology;  Laterality: N/A;   ESOPHAGOGASTRODUODENOSCOPY (EGD) WITH PROPOFOL N/A 04/21/2016   Procedure: ESOPHAGOGASTRODUODENOSCOPY (EGD) WITH PROPOFOL;  Surgeon: Jerene Bears, MD;  Location: [facility];  Service: Gastroenterology;  Laterality: N/A;   FOOT FRACTURE SURGERY Right ~ 2003   FRACTURE SURGERY     ICD IMPLANT N/A 08/03/2016   Procedure: ICD Implant;  Surgeon: Deboraha Sprang, MD;  Location: [place] [facility];  Service: Cardiovascular;   Laterality: N/A;   LAPAROSCOPIC CHOLECYSTECTOMY  12/2006   LAPAROSCOPIC GASTRIC SLEEVE RESECTION     LAPAROSCOPY ABDOMEN DIAGNOSTIC  2008   "cut bile duct w/gallbladder OR; had to go in later & fix leak; hospitalized for 2 months"   LEFT HEART CATHETERIZATION WITH CORONARY ANGIOGRAM N/A 02/26/2014   Procedure: LEFT HEART CATHETERIZATION WITH CORONARY ANGIOGRAM;  Surgeon: Jettie Booze, MD;  Location: [facility];  Service: Cardiovascular;  Laterality: N/A;   OVARIAN CYST REMOVAL Right 1999   PVC ABLATION N/A 12/01/2017   Procedure: PVC ABLATION;  Surgeon: Evans Lance, MD;  Location: [place] [facility];  Service: Cardiovascular;  Laterality: N/A;   RIGHT HEART CATH N/A 08/05/2017   Procedure: RIGHT HEART CATH;  Surgeon: Jolaine Artist, MD;  Location: [place] [facility];  Service: Cardiovascular;  Laterality: N/A;   RIGHT HEART CATH N/A 11/16/2019   Procedure: RIGHT HEART CATH;  Surgeon: Jolaine Artist, MD;  Location: [place] [facility];  Service: Cardiovascular;  Laterality: N/A;   TUBAL LIGATION  1999   Social History   Socioeconomic History   Marital status: Married    Spouse name: Not on file   Number of children: 1   Years of education: Not on file   Highest education level: Not on file  Occupational History   Occupation: stay at home mom  Tobacco Use   Smoking status: Never   Smokeless tobacco: Never  Vaping Use   Vaping Use: Never used  Substance and Sexual Activity   Alcohol use: No   Drug use: No   Sexual activity: Yes  Other Topics Concern   Not on file  Social History Narrative   Not on file   Social Determinants of Health   Financial Resource Strain: Not on file  Food Insecurity: No Food Insecurity (08/23/2019)   Hunger Vital Sign    Worried About Running Out of Food in the Last Year: Never true    Ran Out of Food in the Last Year: Never true  Transportation Needs: No Transportation Needs (08/23/2019)   PRAPARE - Radiographer, therapeutic (Medical): No    Lack of Transportation (Non-Medical): No  Physical Activity: Insufficiently Active (11/28/2019)   Exercise Vital Sign    Days of Exercise per Week: 2 days    Minutes of Exercise per Session: 40 min  Stress: Not on file  Social Connections: Socially Isolated (11/28/2019)   Social Connection and Isolation Panel [NHANES]    Frequency of Communication with Friends and Family: Twice a week    Frequency of Social Gatherings with Friends and Family: Never    Attends Religious Services: Never    Marine scientist or Organizations: No    Attends Music therapist: Never    Marital Status: Married   Allergies  Allergen Reactions   Vancomycin Other (See Comments)    "did something to my kidneys," PROGRESSED TO KIDNEY FAILURE!!   Aspirin Other (See Comments)  Wheezing, (Pt states that she just wheezes some when she takes aspirin by itself but she can take aspirin in a combination product).   Contrast Media [Iodinated Contrast Media] Other (See Comments)    Multiple CT contrast studies done over 2 weeks caused ARF   Ciprofloxacin Itching and Rash   Cyclobenzaprine Other (See Comments)    Can not tolerate   [place] [Dapagliflozin] Rash   Sulfa Antibiotics Itching and Rash   Family History  Problem Relation Age of Onset   Emphysema Maternal Grandmother        smoked   Heart disease Maternal Grandmother 79       MI   Rheum arthritis Mother    Allergies Daughter    Colon cancer Neg Hx     Current Outpatient Medications (Endocrine & Metabolic):    empagliflozin (JARDIANCE) 10 MG TABS tablet, Take 1 tablet (10 mg total) by mouth daily.  Current Outpatient Medications (Cardiovascular):    amiodarone (PACERONE) 200 MG tablet, Take 1 tablet (200 mg total) by mouth daily.   CORLANOR 7.5 MG TABS tablet, TAKE 1 TABLET BY MOUTH TWICE DAILY WITH A MEAL   metoprolol succinate (TOPROL-XL) 50 MG 24 hr tablet, Take 1.5 tablets (75 mg total) by mouth  daily. Take with or immediately following a meal.   mexiletine (MEXITIL) 200 MG capsule, Take 1 capsule (200 mg total) by mouth 2 (two) times daily.   nitroGLYCERIN (NITROSTAT) 0.4 MG SL tablet, Place 1 tablet (0.4 mg total) under the tongue every 5 (five) minutes as needed for chest pain.   sacubitril-valsartan (ENTRESTO) 97-103 MG, Take 1 tablet by mouth 2 (two) times daily.   spironolactone (ALDACTONE) 25 MG tablet, Take 1 tablet (25 mg total) by mouth at bedtime.   torsemide (DEMADEX) 20 MG tablet, Take 20 mg by mouth daily.  Current Outpatient Medications (Respiratory):    albuterol (PROAIR HFA) 108 (90 Base) MCG/ACT inhaler, Inhale 1-2 puffs into the lungs every 6 (six) hours as needed for wheezing or shortness of breath. Follow-up appt is due must see provider for future refills   budesonide-formoterol (SYMBICORT) 160-4.5 MCG/ACT inhaler, Inhale 2 puffs into the lungs 2 (two) times daily as needed. Follow-up appt is due must see provider for future refills   fluticasone (FLONASE) 50 MCG/ACT nasal spray, Place 2 sprays into both nostrils daily as needed for allergies. Follow-up appt is due must see provider for future refills  Current Outpatient Medications (Analgesics):    acetaminophen (TYLENOL) 650 MG CR tablet, Take 650 mg by mouth every 8 (eight) hours as needed for pain (headache).   Ubrogepant (UBRELVY) 100 MG TABS, Take 100 mg by mouth daily as needed (migraine).   Current Outpatient Medications (Other):    dicyclomine (BENTYL) 20 MG tablet, Take 1 tablet (20 mg total) by mouth daily as needed for spasms.   gabapentin (NEURONTIN) 300 MG capsule, Take 1 capsule (300 mg total) by mouth at bedtime as needed (back spasms).   lidocaine (LIDODERM) 5 %, Place 1 patch onto the skin daily. Remove & Discard patch within 12 hours or as directed by MD   Magnesium Oxide 200 MG TABS, Take 1 tablet (200 mg total) by mouth daily.   Multiple Vitamins-Minerals (MULTIVITAMIN GUMMIES WOMENS PO), Take  2 tablets by mouth daily.   potassium chloride SA (KLOR-CON M) 20 MEQ tablet, Take 1 tablet (20 mEq total) by mouth daily.   tiZANidine (ZANAFLEX) 2 MG tablet, Take 1 tablet (2 mg total) by mouth at bedtime.  Vitamin D, Ergocalciferol, (DRISDOL) 1.25 MG (50000 UNIT) CAPS capsule, Take 1 capsule (50,000 Units total) by mouth every 7 (seven) days.   Reviewed prior external information including notes and imaging from  primary care provider As well as notes that were available from care everywhere and other healthcare systems.  Past medical history, social, surgical and family history all reviewed in electronic medical record.  No pertanent information unless stated regarding to the chief complaint.   Review of Systems:  No headache, visual changes, nausea, vomiting, diarrhea, constipation, dizziness, abdominal pain, skin rash, fevers, chills, night sweats, weight loss, swollen lymph nodes, body aches, joint swelling, chest pain, shortness of breath, mood changes. POSITIVE muscle aches  Objective  There were no vitals taken for this visit.   General: No apparent distress alert and oriented x3 mood and affect normal, dressed appropriately.  HEENT: Pupils equal, extraocular movements intact  Respiratory: [name] speak in full sentences and does not appear short of breath  Cardiovascular: No lower extremity edema, non tender, no erythema      Impression and Recommendations:

## 2022-03-03 NOTE — Assessment & Plan Note (Signed)
   Decision today to treat with OMT was based on Physical Exam  After verbal consent patient was treated with HVLA, ME, FPR techniques in cervical, thoracic, rib, lumbar and sacral areas, all areas are chronic   Patient tolerated the procedure well with improvement in symptoms  Patient given exercises, stretches and lifestyle modifications  See medications in patient instructions if given  Patient will follow up in 6-8 weeks 

## 2022-03-03 NOTE — Assessment & Plan Note (Signed)
Chronic problem with worsening symptoms.  Discussed gabapentin 300 mg at night.  We discussed with patient about the osteopathic manipulation which was helpful as well.  Given injection and had significant improvement immediately.  Follow-up again in 6 to 8 weeks

## 2022-03-06 ENCOUNTER — Other Ambulatory Visit: Payer: Self-pay | Admitting: Internal Medicine

## 2022-03-09 ENCOUNTER — Ambulatory Visit (INDEPENDENT_AMBULATORY_CARE_PROVIDER_SITE_OTHER): Payer: HMO

## 2022-03-09 DIAGNOSIS — I5042 Chronic combined systolic (congestive) and diastolic (congestive) heart failure: Secondary | ICD-10-CM | POA: Diagnosis not present

## 2022-03-09 DIAGNOSIS — Z95810 Presence of automatic (implantable) cardiac defibrillator: Secondary | ICD-10-CM

## 2022-03-10 ENCOUNTER — Ambulatory Visit (INDEPENDENT_AMBULATORY_CARE_PROVIDER_SITE_OTHER): Payer: HMO | Admitting: Internal Medicine

## 2022-03-10 ENCOUNTER — Other Ambulatory Visit: Payer: Self-pay | Admitting: Internal Medicine

## 2022-03-10 ENCOUNTER — Encounter: Payer: Self-pay | Admitting: Internal Medicine

## 2022-03-10 DIAGNOSIS — K219 Gastro-esophageal reflux disease without esophagitis: Secondary | ICD-10-CM | POA: Insufficient documentation

## 2022-03-10 DIAGNOSIS — E6609 Other obesity due to excess calories: Secondary | ICD-10-CM

## 2022-03-10 DIAGNOSIS — Z6834 Body mass index (BMI) 34.0-34.9, adult: Secondary | ICD-10-CM

## 2022-03-10 MED ORDER — EMPAGLIFLOZIN 25 MG PO TABS: 25.0000 mg | ORAL_TABLET | Freq: Every day | ORAL | 3 refills | Status: DC

## 2022-03-10 NOTE — Patient Instructions (Signed)
We have sent in jardiance 25 mg to increase when you run out of 10 mg jardiance.  This may help more with the weight.

## 2022-03-10 NOTE — Progress Notes (Signed)
   Subjective:   Patient ID: [name], female    DOB: 09-15-1975, 46 y.o.   MRN: [id number]  HPI The patient is a 46 YO female coming in for concerns about weight and energy.   Review of Systems  Constitutional:  Positive for fatigue and unexpected weight change.  HENT: Negative.    Eyes: Negative.   Respiratory:  Negative for cough, chest tightness and shortness of breath.   Cardiovascular:  Negative for chest pain, palpitations and leg swelling.  Gastrointestinal:  Negative for abdominal distention, abdominal pain, constipation, diarrhea, nausea and vomiting.  Musculoskeletal: Negative.   Skin: Negative.   Neurological: Negative.   Psychiatric/Behavioral: Negative.      Objective:  Physical Exam Constitutional:      Appearance: She is well-developed.  HENT:     Head: Normocephalic and atraumatic.  Cardiovascular:     Rate and Rhythm: Normal rate and regular rhythm.  Pulmonary:     Effort: Pulmonary effort is normal. No respiratory distress.     Breath sounds: Normal breath sounds. No wheezing or rales.  Abdominal:     General: Bowel sounds are normal. There is no distension.     Palpations: Abdomen is soft.     Tenderness: There is no abdominal tenderness. There is no rebound.  Musculoskeletal:     Cervical back: Normal range of motion.  Skin:    General: Skin is warm and dry.  Neurological:     Mental Status: She is alert and oriented to person, place, and time.     Coordination: Coordination normal.     Vitals:   03/10/22 1039  BP: (!) 130/90  Pulse: (!) 105  Temp: 98.6 F (37 C)  TempSrc: Oral  SpO2: 96%  Weight: 209 lb 4 oz (94.9 kg)  Height: '5\' 5"'$  (1.651 m)    Assessment & Plan:

## 2022-03-10 NOTE — Assessment & Plan Note (Signed)
We discussed how she likely does not have coverage for GLP-1. Prior weight loss procedure. She is taking jardiance 10 mg daily for heart failure and will increase to 25 mg daily to see if this can help with weight as well. New rx done today.

## 2022-03-10 NOTE — Assessment & Plan Note (Signed)
Having reflux with eating. Advised to resume protonix 40 mg daily she has at home. Let us know if refills are needed.

## 2022-03-11 ENCOUNTER — Ambulatory Visit: Payer: HMO | Admitting: Family Medicine

## 2022-03-11 ENCOUNTER — Telehealth: Payer: Self-pay

## 2022-03-11 NOTE — Telephone Encounter (Signed)
Remote ICM transmission received.  Attempted call to patient regarding ICM remote transmission and left detailed message per DPR.  Advised to call Dr Bensimhon's office for any fluid symptoms and to rescheduled 9/8 missed appointment.

## 2022-03-11 NOTE — Progress Notes (Signed)
EPIC Encounter for ICM Monitoring  Patient Name: [name] is a 46 y.o. female Date: 03/11/2022 Primary Care Physican: Hoyt Koch, MD Primary Cardiologist: [place] Electrophysiologist: Caryl Comes 11/19/2021 Weight: 214 lbs                                                 Attempted call to patient and unable to reach.  Left detailed message per DPR regarding transmission. Transmission reviewed.    Heartlogic HF Index 34 suggesting possible fluid accumulation starting 9/28.     Barostim implant 6/27.   Prescribed: Torsemide 20 mg Take 1 tablet  (20 mg total) by mouth daily.  Pt self adjusts Torsemide as needed. Potassium 20 mEq take 1 tablet daily Spironolactone 25 mg take 1 tablet (25 mg total) at bedtime.    Labs: 01/05/2022 Creatinine 0.99, BUN 11, Potassium 3.7, Sodium 137 01/01/2022 Creatinine 0.80, BUN 6,   Potassium 4.5, Sodium 136, GFR >60 12/16/2021 Creatinine 1.01, BUN 9,   Potassium 4.1, Sodium 134, GFR >60 12/15/2021 Creatinine 0.98, BUN 5,   Potassium 4.3, Sodium 137, GFR >60  12/13/2021 Creatinine 0.89, BUN 6,   Potassium 3.9, Sodium 137, GFR >60  09/29/2021 Creatinine 0.67, BUN 7,   Potassium 3.9, Sodium 137, GFR >60 06/24/2021 Creatinine 0.83, BUN 9,   Potassium 3.6, Sodium 139 A complete set of results can be found in Results Review.   Recommendations:   Left voice mail with ICM number and encouraged to call if experiencing any fluid symptoms.     Follow-up plan: ICM clinic phone appointment on 04/06/2022 to recheck fluid levels.  91 day device clinic remote transmission 03/18/2022.              EP/Cardiology next office visit:   05/04/2022 with Dr Lovena Le.   Left message to reschedule missed 9/8 appointment with HF clinic.         Copy of ICM check sent to Dr. Caryl Comes.  Will send copy to Dr Haroldine Laws for review if patient is reached.  3 Month Trend    8 Day Data Trend          [name], RN 03/11/2022 9:33 AM

## 2022-03-13 ENCOUNTER — Other Ambulatory Visit: Payer: Self-pay | Admitting: Internal Medicine

## 2022-03-13 DIAGNOSIS — J029 Acute pharyngitis, unspecified: Secondary | ICD-10-CM

## 2022-03-13 DIAGNOSIS — J00 Acute nasopharyngitis [common cold]: Secondary | ICD-10-CM

## 2022-03-13 MED ORDER — BUDESONIDE-FORMOTEROL FUMARATE 160-4.5 MCG/ACT IN AERO: 2.0000 | INHALATION_SPRAY | Freq: Two times a day (BID) | RESPIRATORY_TRACT | 3 refills | Status: DC | PRN

## 2022-03-13 MED ORDER — FLUTICASONE PROPIONATE 50 MCG/ACT NA SUSP: 2.0000 | Freq: Every day | NASAL | 3 refills | Status: DC | PRN

## 2022-03-13 MED ORDER — VITAMIN D (ERGOCALCIFEROL) 1.25 MG (50000 UNIT) PO CAPS: 50000.0000 [IU] | ORAL_CAPSULE | ORAL | 0 refills | Status: DC

## 2022-03-13 MED ORDER — ALBUTEROL SULFATE HFA 108 (90 BASE) MCG/ACT IN AERS: 1.0000 | INHALATION_SPRAY | Freq: Four times a day (QID) | RESPIRATORY_TRACT | 0 refills | Status: DC | PRN

## 2022-03-18 ENCOUNTER — Ambulatory Visit (INDEPENDENT_AMBULATORY_CARE_PROVIDER_SITE_OTHER): Payer: HMO

## 2022-03-18 DIAGNOSIS — I428 Other cardiomyopathies: Secondary | ICD-10-CM

## 2022-03-18 LAB — CUP PACEART REMOTE DEVICE CHECK
Battery Remaining Longevity: 108 mo
Battery Remaining Percentage: 75 %
Brady Statistic RV Percent Paced: 0 %
Date Time Interrogation Session: 20231024182900
HighPow Impedance: 87 Ohm
Implantable Lead Connection Status: 753985
Implantable Lead Implant Date: 20180312
Implantable Lead Location: 753860
Implantable Lead Model: 293
Implantable Lead Serial Number: 422842
Implantable Pulse Generator Implant Date: 20180312
Lead Channel Impedance Value: 663 Ohm
Lead Channel Setting Pacing Amplitude: 2.5 V
Lead Channel Setting Pacing Pulse Width: 0.4 ms
Lead Channel Setting Sensing Sensitivity: 0.5 mV
Pulse Gen Serial Number: 226601
Zone Setting Status: 755011

## 2022-03-19 LAB — CUP PACEART REMOTE DEVICE CHECK
Battery Remaining Longevity: 108 mo
Battery Remaining Percentage: 77 %
Brady Statistic RV Percent Paced: 0 %
Date Time Interrogation Session: 20231023092500
HighPow Impedance: 85 Ohm
Implantable Lead Connection Status: 753985
Implantable Lead Implant Date: 20180312
Implantable Lead Location: 753860
Implantable Lead Model: 293
Implantable Lead Serial Number: 422842
Implantable Pulse Generator Implant Date: 20180312
Lead Channel Impedance Value: 580 Ohm
Lead Channel Setting Pacing Amplitude: 2.5 V
Lead Channel Setting Pacing Pulse Width: 0.4 ms
Lead Channel Setting Sensing Sensitivity: 0.5 mV
Pulse Gen Serial Number: 226601

## 2022-03-20 ENCOUNTER — Encounter: Payer: Self-pay | Admitting: Radiology

## 2022-03-24 ENCOUNTER — Encounter: Payer: Self-pay | Admitting: Radiology

## 2022-03-31 NOTE — Progress Notes (Signed)
Remote ICD transmission.   

## 2022-04-01 NOTE — Progress Notes (Deleted)
[facility] Phone: [phone] Subjective:    I'm seeing this patient by the request  of:  Hoyt Koch, MD  CC:   [id number]  [name] is a 46 y.o. female coming in with complaint of back and neck pain. OMT 03/02/2022.  Patient states   Medications patient has been prescribed: Zanaflex  Taking:         Reviewed prior external information including notes and imaging from previsou exam, outside providers and external EMR if available.   As well as notes that were available from care everywhere and other healthcare systems.  Past medical history, social, surgical and family history all reviewed in electronic medical record.  No pertanent information unless stated regarding to the chief complaint.   Past Medical History:  Diagnosis Date   Acute on chronic systolic (congestive) heart failure (HCC) 04/29/2017   Acute pain of right shoulder 06/16/2017   AICD (automatic cardioverter/defibrillator) present    Anginal pain (HCC)    Anxiety    Arthritis    right shoulder    Asthma    CHF (congestive heart failure) ([place])    Chronic combined systolic and diastolic heart failure ([place]) 03/05/2014   Closed low lateral malleolus fracture 10/23/2013   Cystitis 10/21/2017   Depression    Depression with anxiety 01/20/2013   Diabetes mellitus without complication (HCC)    Diverticulosis    Dyspnea    comes and goes intermittently mostly with exertion    Dysrhythmia    Essential hypertension    Prev followed by [name]    Fibroid    age 56   Gallstones    Generalized abdominal cramping    History of cardiomyopathy    Hypertension    IBS (irritable bowel syndrome)    ICD (implantable cardioverter-defibrillator), single, 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 CPAP intolerance >> "smothering" Good tolerance of auto device (optimal pressure 12-13 on download).  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   Aspirin Other (See Comments)    Wheezing, (Pt states that she just wheezes some when she takes aspirin by itself but she can take aspirin in a combination product).   Contrast Media [Iodinated Contrast Media] Other (See Comments)    Multiple CT contrast studies done over 2 weeks caused ARF   Ciprofloxacin Itching and Rash   Cyclobenzaprine Other (See Comments)    Can not tolerate   [place] [Dapagliflozin] Rash  Sulfa Antibiotics Itching and Rash     Review of Systems:  No headache, visual changes, nausea, vomiting, diarrhea, constipation, dizziness, abdominal pain, skin rash, fevers, chills, night sweats, weight loss, swollen lymph nodes, body aches, joint swelling, chest pain, shortness of breath, mood changes. POSITIVE muscle aches  Objective  There were no vitals taken for this visit.   General: No apparent distress alert and oriented x3 mood and affect normal, dressed appropriately.  HEENT: Pupils equal, extraocular movements intact  Respiratory: [name]  speak in full sentences and does not appear short of breath  Cardiovascular: No lower extremity edema, non tender, no erythema  Gait MSK:  Back   Osteopathic findings  C2 flexed rotated and side bent right C6 flexed rotated and side bent left T3 extended rotated and side bent right inhaled rib T9 extended rotated and side bent left L2 flexed rotated and side bent right Sacrum right on right       Assessment and Plan:  No problem-specific Assessment & Plan notes found for this encounter.    Nonallopathic problems  Decision today to treat with OMT was based on Physical Exam  After verbal consent patient was treated with HVLA, ME, FPR techniques in cervical, rib, thoracic, lumbar, and sacral  areas  Patient tolerated the procedure well with improvement in symptoms  Patient given exercises, stretches and lifestyle modifications  See medications in patient instructions if given  Patient will follow up in 4-8 weeks             Note: This dictation was prepared with Dragon dictation along with smaller phrase technology. Any transcriptional errors that result from this process are unintentional.

## 2022-04-02 ENCOUNTER — Telehealth (HOSPITAL_COMMUNITY): Payer: Self-pay

## 2022-04-02 NOTE — Telephone Encounter (Signed)
Called and left patient a voice message to confirm/remind patient of their appointment at the [facility] on 04/03/22.

## 2022-04-03 ENCOUNTER — Encounter (HOSPITAL_COMMUNITY): Payer: HMO

## 2022-04-03 ENCOUNTER — Encounter (HOSPITAL_COMMUNITY): Payer: Self-pay

## 2022-04-06 ENCOUNTER — Ambulatory Visit (INDEPENDENT_AMBULATORY_CARE_PROVIDER_SITE_OTHER): Payer: HMO

## 2022-04-06 DIAGNOSIS — I5042 Chronic combined systolic (congestive) and diastolic (congestive) heart failure: Secondary | ICD-10-CM

## 2022-04-06 DIAGNOSIS — Z95810 Presence of automatic (implantable) cardiac defibrillator: Secondary | ICD-10-CM

## 2022-04-07 ENCOUNTER — Encounter: Payer: Self-pay | Admitting: Family Medicine

## 2022-04-07 ENCOUNTER — Ambulatory Visit: Payer: HMO | Admitting: Family Medicine

## 2022-04-08 NOTE — Addendum Note (Signed)
Addended by: [name] on: 04/08/2022 05:41 PM   Modules accepted: Level of Service

## 2022-04-08 NOTE — Progress Notes (Signed)
EPIC Encounter for ICM Monitoring  Patient Name: [name] is a 46 y.o. female Date: 04/08/2022 Primary Care Physican: Hoyt Koch, MD Primary Cardiologist: Bensimhon Electrophysiologist: Caryl Comes 11/19/2021 Weight: 214 lbs      04/08/2022 Weight: 190-194 lbs                                            Spoke with patient and heart failure questions reviewed.  Transmission results reviewed.  Pt asymptomatic for fluid accumulation.  Reports feeling well at this time and voices no complaints.     Heartlogic HF Index 3 suggesting fluid levels returned to normal after taking extra Torsemide.    Barostim implant 6/27.   Prescribed: Torsemide 20 mg Take 1 tablet  (20 mg total) by mouth daily.  Pt self adjusts Torsemide as needed. Potassium 20 mEq take 1 tablet daily Spironolactone 25 mg take 1 tablet (25 mg total) at bedtime.    Labs: 01/05/2022 Creatinine 0.99, BUN 11, Potassium 3.7, Sodium 137 01/01/2022 Creatinine 0.80, BUN 6,   Potassium 4.5, Sodium 136, GFR >60 12/16/2021 Creatinine 1.01, BUN 9,   Potassium 4.1, Sodium 134, GFR >60 12/15/2021 Creatinine 0.98, BUN 5,   Potassium 4.3, Sodium 137, GFR >60  12/13/2021 Creatinine 0.89, BUN 6,   Potassium 3.9, Sodium 137, GFR >60  09/29/2021 Creatinine 0.67, BUN 7,   Potassium 3.9, Sodium 137, GFR >60 06/24/2021 Creatinine 0.83, BUN 9,   Potassium 3.6, Sodium 139 A complete set of results can be found in Results Review.   Recommendations:   No changes and encouraged to call if experiencing any fluid symptoms.   Follow-up plan: ICM clinic phone appointment on 05/11/2022.  91 day device clinic remote transmission 06/17/2022.              EP/Cardiology next office visit:   05/04/2022 with Dr Lovena Le.            Copy of ICM check sent to Dr. Caryl Comes.  3 Month Trend    8 Day Data Trend          Megan Billings, RN 04/08/2022 5:11 PM

## 2022-04-18 ENCOUNTER — Other Ambulatory Visit: Payer: Self-pay | Admitting: Internal Medicine

## 2022-04-18 ENCOUNTER — Other Ambulatory Visit: Payer: Self-pay | Admitting: Family Medicine

## 2022-04-22 ENCOUNTER — Other Ambulatory Visit: Payer: Self-pay | Admitting: Internal Medicine

## 2022-04-27 ENCOUNTER — Other Ambulatory Visit: Payer: Self-pay

## 2022-04-27 ENCOUNTER — Telehealth: Payer: Self-pay

## 2022-04-27 MED ORDER — METOLAZONE 5 MG PO TABS: ORAL_TABLET | ORAL | Status: DC

## 2022-04-27 NOTE — Telephone Encounter (Signed)
Spoke with patient due to patient sent remote transmission for review.    She has the following symptoms: chest discomfort (not a new symptom) that is under her left breast and across the chest Feeling foggy headed Very tired Weak  Constipation for past 2 weeks.   Weight is up 4 lbs within the last week.  Today's weight is 197 lbs (baseline 190-194 lbs) Heart racing that she contributes to PVC's.  Tried: She took extra 20 mg Torsemide 20 mg x 2 days last week without any increase in urine output.  She confirmed she takings the following daily: Torsemide 20 mg Take 1 tablet  (20 mg total) by mouth daily.  Pt self adjusts Torsemide as needed. Potassium 20 mEq take 1 tablet daily Spironolactone 25 mg take 1 tablet (25 mg total) at bedtime.   Recommended to go to ER if needed for evaluation of chest pain and symptoms.  She would like to see if he fluids can be managed at home before going to ED.    Advised will send to Dr Haroldine Laws for review and recommendations.     Heartlogic HF Index has increased from 1 on 11/20 to 47 on today's, 12/4, report.

## 2022-04-27 NOTE — Telephone Encounter (Signed)
Spoke with patient.  Advised Dr Haroldine Laws ordered Metolazone 5 mg + Potassium 2 tablets to = 40 mEq daily x 2 days only.  Advised the Metolazone is a strong diuretic and if she has problems after taking the 1st dosage to call back before taking 2nd day.  She verbalized understanding.  Advised to take the Metolazone with extra potassium 30 minutes before taking Torsemide if possible. Confirmed preferred pharmacy.  Advised to call back with any questions.  Also explained HF clinic will contact her for office visit with APP.

## 2022-04-28 ENCOUNTER — Encounter (HOSPITAL_COMMUNITY): Payer: Self-pay

## 2022-04-28 ENCOUNTER — Ambulatory Visit: Payer: HMO | Admitting: Internal Medicine

## 2022-04-28 ENCOUNTER — Other Ambulatory Visit: Payer: Self-pay

## 2022-04-28 ENCOUNTER — Emergency Department (HOSPITAL_BASED_OUTPATIENT_CLINIC_OR_DEPARTMENT_OTHER): Payer: HMO

## 2022-04-28 ENCOUNTER — Emergency Department (HOSPITAL_COMMUNITY): Payer: HMO

## 2022-04-28 ENCOUNTER — Emergency Department (HOSPITAL_COMMUNITY)
Admission: EM | Admit: 2022-04-28 | Discharge: 2022-04-28 | Disposition: A | Discharge status: Home / Self Care | Payer: HMO | Attending: Emergency Medicine | Admitting: Emergency Medicine

## 2022-04-28 DIAGNOSIS — I110 Hypertensive heart disease with heart failure: Secondary | ICD-10-CM | POA: Diagnosis not present

## 2022-04-28 DIAGNOSIS — R0602 Shortness of breath: Secondary | ICD-10-CM | POA: Diagnosis not present

## 2022-04-28 DIAGNOSIS — I509 Heart failure, unspecified: Secondary | ICD-10-CM

## 2022-04-28 DIAGNOSIS — Z79899 Other long term (current) drug therapy: Secondary | ICD-10-CM | POA: Insufficient documentation

## 2022-04-28 DIAGNOSIS — I5043 Acute on chronic combined systolic (congestive) and diastolic (congestive) heart failure: Secondary | ICD-10-CM | POA: Insufficient documentation

## 2022-04-28 DIAGNOSIS — R0789 Other chest pain: Secondary | ICD-10-CM | POA: Insufficient documentation

## 2022-04-28 DIAGNOSIS — R609 Edema, unspecified: Secondary | ICD-10-CM

## 2022-04-28 DIAGNOSIS — R079 Chest pain, unspecified: Secondary | ICD-10-CM | POA: Diagnosis present

## 2022-04-28 LAB — CBC WITH DIFFERENTIAL/PLATELET
Abs Immature Granulocytes: 0.03 10*3/uL (ref 0.00–0.07)
Basophils Absolute: 0 10*3/uL (ref 0.0–0.1)
Basophils Relative: 1 %
Eosinophils Absolute: 0.1 10*3/uL (ref 0.0–0.5)
Eosinophils Relative: 2 %
HCT: 35.5 % — ABNORMAL LOW (ref 36.0–46.0)
Hemoglobin: 11.3 g/dL — ABNORMAL LOW (ref 12.0–15.0)
Immature Granulocytes: 0 %
Lymphocytes Relative: 28 %
Lymphs Abs: 2 10*3/uL (ref 0.7–4.0)
MCH: 27.9 pg (ref 26.0–34.0)
MCHC: 31.8 g/dL (ref 30.0–36.0)
MCV: 87.7 fL (ref 80.0–100.0)
Monocytes Absolute: 0.5 10*3/uL (ref 0.1–1.0)
Monocytes Relative: 7 %
Neutro Abs: 4.5 10*3/uL (ref 1.7–7.7)
Neutrophils Relative %: 62 %
Platelets: 248 10*3/uL (ref 150–400)
RBC: 4.05 MIL/uL (ref 3.87–5.11)
RDW: 13.8 % (ref 11.5–15.5)
WBC: 7.1 10*3/uL (ref 4.0–10.5)
nRBC: 0 % (ref 0.0–0.2)

## 2022-04-28 LAB — BASIC METABOLIC PANEL
Anion gap: 7 (ref 5–15)
BUN: 10 mg/dL (ref 6–20)
CO2: 24 mmol/L (ref 22–32)
Calcium: 8.8 mg/dL — ABNORMAL LOW (ref 8.9–10.3)
Chloride: 107 mmol/L (ref 98–111)
Creatinine, Ser: 0.95 mg/dL (ref 0.44–1.00)
GFR, Estimated: >60 mL/min (ref >60)
Glucose, Bld: 94 mg/dL (ref 70–99)
Potassium: 3.5 mmol/L (ref 3.5–5.1)
Sodium: 138 mmol/L (ref 135–145)

## 2022-04-28 LAB — TROPONIN I (HIGH SENSITIVITY)
Troponin I (High Sensitivity): 10 ng/L (ref <18)
Troponin I (High Sensitivity): 11 ng/L (ref <18)

## 2022-04-28 LAB — D-DIMER, QUANTITATIVE: D-Dimer, Quant: 0.67 ug/mL-FEU — ABNORMAL HIGH (ref 0.00–0.50)

## 2022-04-28 LAB — BRAIN NATRIURETIC PEPTIDE: B Natriuretic Peptide: 740.6 pg/mL — ABNORMAL HIGH (ref 0.0–100.0)

## 2022-04-28 MED ORDER — FUROSEMIDE 10 MG/ML IJ SOLN
40.0000 mg | Freq: Once | INTRAMUSCULAR | Status: AC
  Administered 2022-04-28: 40 mg via INTRAVENOUS
  Filled 2022-04-28: qty 4

## 2022-04-28 MED ORDER — FUROSEMIDE 10 MG/ML IJ SOLN
80.0000 mg | Freq: Once | INTRAMUSCULAR | Status: AC
  Administered 2022-04-28: 80 mg via INTRAVENOUS
  Filled 2022-04-28: qty 8

## 2022-04-28 MED ORDER — IOHEXOL 350 MG/ML SOLN
100.0000 mL | Freq: Once | INTRAVENOUS | Status: AC | PRN
  Administered 2022-04-28: 100 mL via INTRAVENOUS

## 2022-04-28 NOTE — Progress Notes (Signed)
Lower extremity venous duplex has been completed.   Preliminary results in CV Proc.   [name] Raydan Schlabach 04/28/2022 11:01 AM

## 2022-04-28 NOTE — ED Triage Notes (Signed)
Pt reports left sided chest pain that radiates to the middle of her chest, described as pressure that worsens with ambulation; onset Sunday night. Denies SOB.

## 2022-04-28 NOTE — Telephone Encounter (Signed)
Called pt to f/u, she went to ER early this AM and was given IV Lasix and sent home, she was instructed to take Metolazone tomorrow and Thursday, f/u appt was sch for 12/13, if not feeling better in day or 2 she will call us back.

## 2022-04-28 NOTE — Discharge Instructions (Addendum)
It was a pleasure taking care of you today.  As discussed, I believe you are having a heart failure exacerbation.  You were evaluated by the heart failure clinic and you were given IV Lasix.  Please continue the regimen Dr. Haroldine Laws recommended as noted below.  Please call the heart failure clinic to schedule appointment this week for further evaluation.  Your CT scan did not show a blood clot.  Return to the ER for any worsening symptoms.  Metolazone '5mg'$  +2 potassium tablets for the next 2 days. take the Metolazone with extra potassium 30 minutes before taking Torsemide if possible.

## 2022-04-28 NOTE — ED Notes (Signed)
Interrogated pt's pacemaker per order

## 2022-04-28 NOTE — ED Provider Triage Note (Signed)
Emergency Medicine Provider Triage Evaluation Note  [name] , a 46 y.o. female  was evaluated in triage.  Pt complains of chest pain.  States left sided pressure radiating to middle of the chest.  Pain worse with ambulation.  Denies SOB.  Review of Systems  Positive: Chest pain Negative: fever  Physical Exam  BP (!) 147/114 (BP Location: Right Arm)   Pulse (!) 109   Temp 97.7 F (36.5 C)   Resp 18   Ht '5\' 5"'$  (1.651 m)   Wt 91.6 kg   LMP  (LMP Unknown)   SpO2 99%   BMI 33.61 kg/m   Gen:   Awake, no distress   Resp:  Normal effort  MSK:   Moves extremities without difficulty  Other:    Medical Decision Making  Medically screening exam initiated at 2:44 AM.  Appropriate orders placed.  [name] was informed that the remainder of the evaluation will be completed by another provider, this initial triage assessment does not replace that evaluation, and the importance of remaining in the ED until their evaluation is complete.  Chest pain, worse with exertion.  Hx of CHF, cardiomyopathy.  EKG, labs, CXR.   Megan Pickett, PA-C 04/28/22 [phone]

## 2022-04-28 NOTE — Progress Notes (Signed)
Heart Failure Navigator Progress Note  Assessed for Heart & Vascular TOC clinic readiness.  Patient does not meet criteria due to prior to hospitalization care established with AHF clinic and Dr. Haroldine Laws.  HF Navigation team will sign-off.   Pricilla Holm, MSN, RN Heart Failure Nurse Navigator

## 2022-04-28 NOTE — ED Provider Notes (Signed)
[facility] EMERGENCY DEPARTMENT Provider Note   CSN: [id number] Arrival date & time: 04/28/22  0205     History  Chief Complaint  Patient presents with   Chest Pain    [name] is a 46 y.o. female with a past medical history significant for hypertension, depression, IBS, congestive heart failure, frequent PVCs, nonischemic cardiomyopathy, ICD in place who presents to the ED due to chest pain that started 2 days ago. She admits to a 6 pound weight gain since yesterday.  Patient follows Dr. Haroldine Laws with HF clinic.  She admits to some shortness of breath with exertion and when lying flat.  Admits to some lower extremity edema (right greater than left).  No history of blood clots. She notes this feels typical for her CHF exacerbations.  Patient was advised by Dr. Haroldine Laws to take Metolazone '5mg'$  +2 potassium tablets for the next 2 days however, patient has been unable to start.   History obtained from patient and past medical records. No interpreter used during encounter.       Home Medications Prior to Admission medications   Medication Sig Start Date End Date Taking? Authorizing Provider  acetaminophen (TYLENOL) 650 MG CR tablet Take 650 mg by mouth every 8 (eight) hours as needed for pain (headache).    [provider]  albuterol (PROAIR HFA) 108 (90 Base) MCG/ACT inhaler Inhale 1-2 puffs into the lungs every 6 (six) hours as needed for wheezing or shortness of breath. 03/13/22   Hoyt Koch, MD  amiodarone (PACERONE) 200 MG tablet Take 1 tablet (200 mg total) by mouth daily. 02/04/21   Shirley Friar, PA-C  budesonide-formoterol (SYMBICORT) 160-4.5 MCG/ACT inhaler Inhale 2 puffs into the lungs 2 (two) times daily as needed. 03/13/22   Hoyt Koch, MD  CORLANOR 7.5 MG TABS tablet TAKE 1 TABLET BY MOUTH TWICE DAILY WITH A MEAL 02/16/22   Bensimhon, Shaune Pascal, MD  dicyclomine (BENTYL) 20 MG tablet Take 1 tablet (20 mg total) by  mouth daily as needed for spasms. 05/30/20   Hoyt Koch, MD  empagliflozin (JARDIANCE) 25 MG TABS tablet Take 1 tablet (25 mg total) by mouth daily before breakfast. 03/10/22   Hoyt Koch, MD  fluticasone [facility]) 50 MCG/ACT nasal spray Place 2 sprays into both nostrils daily as needed for allergies. 03/13/22   Hoyt Koch, MD  gabapentin (NEURONTIN) 300 MG capsule Take 1 capsule (300 mg total) by mouth at bedtime as needed (back spasms). 05/30/20   Hoyt Koch, MD  lidocaine (LIDODERM) 5 % Place 1 patch onto the skin daily. Remove & Discard patch within 12 hours or as directed by MD 01/05/22   Hoyt Koch, MD  Magnesium Oxide 200 MG TABS Take 1 tablet (200 mg total) by mouth daily. 05/30/20   Bensimhon, Shaune Pascal, MD  metolazone (ZAROXOLYN) 5 MG tablet Take 1 tablet with additional 40 mEq of Potassium daily x 2 days only. 04/27/22   Bensimhon, Shaune Pascal, MD  metoprolol succinate (TOPROL-XL) 50 MG 24 hr tablet Take 1.5 tablets (75 mg total) by mouth daily. Take with or immediately following a meal. 01/05/22   Tillery, Satira Mccallum, PA-C  mexiletine (MEXITIL) 200 MG capsule Take 1 capsule (200 mg total) by mouth 2 (two) times daily. 06/24/21   Bensimhon, Shaune Pascal, MD  Multiple Vitamins-Minerals (MULTIVITAMIN GUMMIES WOMENS PO) Take 2 tablets by mouth daily.    [provider]  nitroGLYCERIN (NITROSTAT) 0.4 MG SL tablet  Place 1 tablet (0.4 mg total) under the tongue every 5 (five) minutes as needed for chest pain. 06/20/21   Bensimhon, Shaune Pascal, MD  potassium chloride SA (KLOR-CON M) 20 MEQ tablet Take 1 tablet (20 mEq total) by mouth daily. 01/01/22   Larey Dresser, MD  sacubitril-valsartan (ENTRESTO) 97-103 MG Take 1 tablet by mouth 2 (two) times daily. 09/29/21   Bensimhon, Shaune Pascal, MD  spironolactone (ALDACTONE) 25 MG tablet Take 1 tablet (25 mg total) by mouth at bedtime. 01/14/21   Bensimhon, Shaune Pascal, MD  tiZANidine (ZANAFLEX) 2 MG tablet TAKE 1  TABLET BY MOUTH AT BEDTIME 04/19/22   Lyndal Pulley, DO  torsemide (DEMADEX) 20 MG tablet Take 20 mg by mouth daily.    [provider]  Ubrogepant (UBRELVY) 100 MG TABS Take 100 mg by mouth daily as needed (migraine). 06/11/21   Hoyt Koch, MD  Vitamin D, Ergocalciferol, (DRISDOL) 1.25 MG (50000 UNIT) CAPS capsule Take 1 capsule (50,000 Units total) by mouth every 7 (seven) days. 03/13/22   Hoyt Koch, MD      Allergies    Vancomycin, Aspirin, Contrast media [iodinated contrast media], Ciprofloxacin, Cyclobenzaprine, Farxiga [dapagliflozin], and Sulfa antibiotics    Review of Systems   Review of Systems  Constitutional:  Negative for chills and fever.  Respiratory:  Positive for cough and shortness of breath.   Cardiovascular:  Positive for chest pain and leg swelling.    Physical Exam Updated Vital Signs BP (!) 140/93 (BP Location: Right Arm)   Pulse 97   Temp 97.8 F (36.6 C) (Oral)   Resp 16   Ht '5\' 5"'$  (1.651 m)   Wt 91.6 kg   LMP  (LMP Unknown)   SpO2 100%   BMI 33.61 kg/m  Physical Exam Vitals and nursing note reviewed.  Constitutional:      General: She is not in acute distress.    Appearance: She is not ill-appearing.  HENT:     Head: Normocephalic.  Eyes:     Pupils: Pupils are equal, round, and reactive to light.  Cardiovascular:     Rate and Rhythm: Normal rate and regular rhythm.     Pulses: Normal pulses.     Heart sounds: Normal heart sounds. No murmur heard.    No friction rub. No gallop.  Pulmonary:     Effort: Pulmonary effort is normal.     Breath sounds: Normal breath sounds.  Abdominal:     General: Abdomen is flat. There is no distension.     Palpations: Abdomen is soft.     Tenderness: There is no abdominal tenderness. There is no guarding or rebound.  Musculoskeletal:        General: Normal range of motion.     Cervical back: Neck supple.  Skin:    General: Skin is warm and dry.  Neurological:     General:  No focal deficit present.     Mental Status: She is alert.  Psychiatric:        Mood and Affect: Mood normal.        Behavior: Behavior normal.     ED Results / Procedures / Treatments   Labs (all labs ordered are listed, but only abnormal results are displayed) Labs Reviewed  CBC WITH DIFFERENTIAL/PLATELET - Abnormal; Notable for the following components:      Result Value   Hemoglobin 11.3 (*)    HCT 35.5 (*)    All other components within normal limits  BASIC METABOLIC PANEL - Abnormal; Notable for the following components:   Calcium 8.8 (*)    All other components within normal limits  BRAIN NATRIURETIC PEPTIDE - Abnormal; Notable for the following components:   B Natriuretic Peptide 740.6 (*)    All other components within normal limits  D-DIMER, QUANTITATIVE - Abnormal; Notable for the following components:   D-Dimer, Quant 0.67 (*)    All other components within normal limits  TROPONIN I (HIGH SENSITIVITY)  TROPONIN I (HIGH SENSITIVITY)    EKG None  Radiology CT Angio Chest PE W and/or Wo Contrast  Result Date: 04/28/2022 CLINICAL DATA:  Chest pain, PE suspected EXAM: CT ANGIOGRAPHY CHEST WITH CONTRAST TECHNIQUE: Multidetector CT imaging of the chest was performed using the standard protocol during bolus administration of intravenous contrast. Multiplanar CT image reconstructions and MIPs were obtained to evaluate the vascular anatomy. RADIATION DOSE REDUCTION: This exam was performed according to the departmental dose-optimization program which includes automated exposure control, adjustment of the mA and/or kV according to patient size and/or use of iterative reconstruction technique. CONTRAST:  150m OMNIPAQUE IOHEXOL 350 MG/ML SOLN COMPARISON:  12/14/2021 FINDINGS: Cardiovascular: Satisfactory opacification of the pulmonary arteries to the segmental level. No evidence of pulmonary embolism. Cardiomegaly. Left chest single lead pacer defibrillator. No pericardial  effusion. Mediastinum/Nodes: No enlarged mediastinal, hilar, or axillary lymph nodes. Thyroid gland, trachea, and esophagus demonstrate no significant findings. Lungs/Pleura: Evaluation of the lungs is somewhat limited by breath motion artifact. Within this limitation, bandlike compressive atelectasis of the retrocardiac left lung, with mild ground-glass opacity of the bilateral lung bases (series 6, image 85). No pleural effusion or pneumothorax. Upper Abdomen: No acute abnormality. Evidence of prior partial sleeve gastrectomy. Musculoskeletal: No chest wall abnormality. No acute osseous findings. Review of the MIP images confirms the above findings. IMPRESSION: 1. Negative examination for pulmonary embolism. 2. Evaluation of the lungs is somewhat limited by breath motion artifact. Within this limitation, bandlike compressive atelectasis of the retrocardiac left lung, with mild ground-glass opacity of the bilateral lung bases. Findings are most consistent with atelectasis although infection or aspiration is not excluded. 3. Cardiomegaly. Electronically Signed   By: ADelanna AhmadiM.D.   On: 04/28/2022 12:09   VAS [place]OWER EXTREMITY VENOUS (DVT) (7a-7p)  Result Date: 04/28/2022  Lower Venous DVT Study Patient Name:  ASHACARA COZINE Date of Exam:   04/28/2022 Medical Rec #: [id number]      Accession #:    [id number] of Birth: [birth year]      Patient Gender: F Patient Age:   89years [facility]      VAS [place]OWER EXTREMITY VENOUS (DVT) Referring Phys: CCharmaine Downs--------------------------------------------------------------------------------  Indications: Edema.  Comparison Study: 11/10/15 prior Performing Technologist: MArchie PattenRVS  Examination Guidelines: A complete evaluation includes B-mode imaging, spectral Doppler, color Doppler, and power Doppler as needed of all accessible portions of each vessel. Bilateral testing is considered an integral part of a  complete examination. Limited examinations for reoccurring indications may be performed as noted. The reflux portion of the exam is performed with the patient in reverse Trendelenburg.  +---------+---------------+---------+-----------+----------+--------------+ RIGHT    CompressibilityPhasicitySpontaneityPropertiesThrombus Aging +---------+---------------+---------+-----------+----------+--------------+ CFV      Full           Yes      Yes                                 +---------+---------------+---------+-----------+----------+--------------+  SFJ      Full                                                        +---------+---------------+---------+-----------+----------+--------------+ FV Prox  Full                                                        +---------+---------------+---------+-----------+----------+--------------+ FV Mid   Full                                                        +---------+---------------+---------+-----------+----------+--------------+ FV DistalFull                                                        +---------+---------------+---------+-----------+----------+--------------+ PFV      Full                                                        +---------+---------------+---------+-----------+----------+--------------+ POP      Full           Yes      Yes                                 +---------+---------------+---------+-----------+----------+--------------+ PTV      Full                                                        +---------+---------------+---------+-----------+----------+--------------+ PERO     Full                                                        +---------+---------------+---------+-----------+----------+--------------+   +----+---------------+---------+-----------+----------+--------------+ LEFTCompressibilityPhasicitySpontaneityPropertiesThrombus Aging  +----+---------------+---------+-----------+----------+--------------+ CFV Full           Yes      Yes                                 +----+---------------+---------+-----------+----------+--------------+     Summary: RIGHT: - There is no evidence of deep vein thrombosis in the lower extremity.  - No cystic structure found in the popliteal fossa.  LEFT: - No evidence of common femoral vein obstruction.  *See table(s) above for measurements and observations.    Preliminary  DG Chest 2 View  Result Date: 04/28/2022 CLINICAL DATA:  Chest pain, shortness of breath EXAM: CHEST - 2 VIEW COMPARISON:  12/13/2021 FINDINGS: Left AICD and right neural stimulator again noted, unchanged. Heart is mildly enlarged. No confluent opacities, effusions or edema. No acute bony abnormality. IMPRESSION: No active cardiopulmonary disease. Electronically Signed   By: Rolm Baptise M.D.   On: 04/28/2022 03:11    Procedures Procedures    Medications Ordered in ED Medications  furosemide (LASIX) injection 40 mg (40 mg Intravenous Given 04/28/22 1033)  iohexol (OMNIPAQUE) 350 MG/ML injection 100 mL (100 mLs Intravenous Contrast Given 04/28/22 1151)  furosemide (LASIX) injection 80 mg (80 mg Intravenous Given 04/28/22 1247)    ED Course/ Medical Decision Making/ A&P Clinical Course as of 04/28/22 1258  Tue Apr 28, 2022  1114 Patient notes she had a CT scan in July 2023 with no issues. She notes she was hospitalized for 2 months in the past where she received numerous contrast studies causing ARF. Patient agreeable to have CTA chest done to rule out PE. Renal function normal today. [CA]    Clinical Course User Index [CA] Suzy Bouchard, PA-C                           Medical Decision Making Amount and/or Complexity of Data Reviewed External Data Reviewed: notes.    Details: Cardiology  notes Labs: ordered. Decision-making details documented in ED Course. Radiology: ordered and independent interpretation  performed. Decision-making details documented in ED Course. ECG/medicine tests: ordered and independent interpretation performed. Decision-making details documented in ED Course.  Risk Prescription drug management.   This patient presents to the ED for concern of chest pain, this involves an extensive number of treatment options, and is a complaint that carries with it a high risk of complications and morbidity.  The differential diagnosis includes ACS, PE, pneumonia, CHF exacerbation, MSK etiology, etc  46 year old female presents to the ED due to chest pain associated with 6 pound weight gain x2 days.  History of CHF.  ICD in place. Denies any firing of ICD recently. Patient was advised by Dr. Mah[place] Longest to take Metolazone '5mg'$  +2 potassium tablets for the next 2 days however, patient has been unable to start.  Upon arrival, patient afebrile, mildly tachycardic at 109 which improved during initial evaluation with otherwise stable vitals.  Normal oxygen saturation.  Patient in no acute distress.  Benign physical exam.  No lower extremity edema.  Routine labs ordered at triage.  BNP to rule out CHF.  Added D-dimer due to chest pain and patient states right lower extremity appears more edematous than left.  Ultrasound to rule out DVT.  BNP elevated at 740.  Will give IV dose of Lasix after discussion with Dr. Doren Custard.  Delta troponin flat. EKG demonstrates stimulator artifact. Low suspicion for ACS given normal troponins. D-dimer elevated. CTA ordered. See note above about "allergy".  Ultrasound negative for DVT.  Hope, RN was able to reach out to [employer] representative who faxed the paperwork; however still unable to find paperwork. Per ICD team, no abnormalities or event except for increased fluid levels indicative of CHF exacerbation.   12:18 PM Discussed with Alma with HF team who recommends giving patient an additional '80mg'$  lasix (total '120mg'$ ) and to start the regimen Dr. Mah[place] Longest recommended tomorrow.  F/u in outpatient setting.  CTA personally reviewed and interpreted which demonstrates mild groundglass opacities of bilateral lung bases likely consistent  with atelectasis.  Lower suspicion for pneumonia given no fever and normal white blood cell count.   Considered hospitalization for CHF exacerbation however, felt patient was stable for discharge given no episodes of hypoxia and improvement in symptoms after IV Lasix. Patient notes she feels comfortable going home. Patient has a scheduled appointment with the heart failure clinic this week for further evaluation. Strict ED precautions discussed with patient. Patient states understanding and agrees to plan. Patient discharged home in no acute distress and stable vitals.  Discussed with Dr. Doren Custard who agrees with assessment and plan.        Final Clinical Impression(s) / ED Diagnoses Final diagnoses:  Acute on chronic congestive heart failure, unspecified heart failure type ([place])  Nonspecific chest pain    Rx / DC Orders ED Discharge Orders     None         Karie Kirks 04/28/22 1258    Godfrey Pick, MD 04/28/22 1718

## 2022-05-04 ENCOUNTER — Ambulatory Visit: Payer: HMO | Attending: Internal Medicine | Admitting: Internal Medicine

## 2022-05-04 ENCOUNTER — Ambulatory Visit (INDEPENDENT_AMBULATORY_CARE_PROVIDER_SITE_OTHER): Payer: HMO | Admitting: Internal Medicine

## 2022-05-04 ENCOUNTER — Encounter: Payer: Self-pay | Admitting: Internal Medicine

## 2022-05-04 ENCOUNTER — Encounter: Payer: Self-pay | Admitting: Hematology and Oncology

## 2022-05-04 VITALS — BP 138/83 | HR 88 | Temp 98.4°F | Ht 65.0 in | Wt 201.0 lb

## 2022-05-04 VITALS — BP 132/84 | HR 98 | Ht 65.0 in | Wt 200.8 lb

## 2022-05-04 DIAGNOSIS — I5042 Chronic combined systolic (congestive) and diastolic (congestive) heart failure: Secondary | ICD-10-CM | POA: Diagnosis not present

## 2022-05-04 DIAGNOSIS — Z95810 Presence of automatic (implantable) cardiac defibrillator: Secondary | ICD-10-CM

## 2022-05-04 DIAGNOSIS — J4520 Mild intermittent asthma, uncomplicated: Secondary | ICD-10-CM | POA: Diagnosis not present

## 2022-05-04 DIAGNOSIS — I493 Ventricular premature depolarization: Secondary | ICD-10-CM | POA: Diagnosis not present

## 2022-05-04 DIAGNOSIS — Z0001 Encounter for general adult medical examination with abnormal findings: Secondary | ICD-10-CM | POA: Diagnosis not present

## 2022-05-04 DIAGNOSIS — I10 Essential (primary) hypertension: Secondary | ICD-10-CM

## 2022-05-04 DIAGNOSIS — R519 Headache, unspecified: Secondary | ICD-10-CM

## 2022-05-04 DIAGNOSIS — Z3009 Encounter for other general counseling and advice on contraception: Secondary | ICD-10-CM

## 2022-05-04 DIAGNOSIS — I5023 Acute on chronic systolic (congestive) heart failure: Secondary | ICD-10-CM

## 2022-05-04 DIAGNOSIS — D500 Iron deficiency anemia secondary to blood loss (chronic): Secondary | ICD-10-CM

## 2022-05-04 MED ORDER — LEVONORGEST-ETH ESTRAD 91-DAY 0.15-0.03 &0.01 MG PO TABS: 1.0000 | ORAL_TABLET | Freq: Every day | ORAL | 4 refills | Status: DC

## 2022-05-04 NOTE — Progress Notes (Signed)
Advanced Heart Failure Clinic Note   Referring Physician: PCP: Hoyt Koch, MD PCP-Cardiologist: Sinclair Grooms, MD  [facility]: Dr. Haroldine Laws   Reason for Visit: F/u for Chronic Systolic Heart Failure  HPI: [name] is a 46 y.o. 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   Admitted 7/19 with CP Symptoms thought to be related to frequent PVCs. EP consulted and scheduled her for PVC ablation. Echo repeated 11/22/17 and showed improved EF 50-55%.    Underwent PVC ablation 12/01/17. Was felt not to be successful.  Was on flecainide but discontinued due to reduced EF. Placed on amio   Echo 10/2018 showed drop in EF back down to 25-30%. Was instructed to f/u in clinic but pt did not. She had outpatient monitor 04/2018 that showed rare PVCs.    Admitted 6/21 with chest pain and shortness of breath. Viral panel negative. ECHO EF < 20% and normal RV. Had cath with normal cors and preserved cardiac output. Plan was to f/u with EP regarding PVCs. She has remained on AAD therapy w/ amiodarone + ? blocker therapy.  Admitted to [facility] 4/22 w/ increased dyspnea and volume overload. Also w/ increased PVC burden. Echo showed EF 20-25%, RV normal. She was diuresed w/ IV Lasix and placed on amiodarone gtt for PVC suppression. Seen by Dr. Lovena Le, due to the location of her PVCs, she is not a candidate for re-do ablation.   S/p Barostim 6/22.  Zio 8/22: 13% PVCs (unifocal)  Seen by EP 12/22 and Barostim titrated. Not felt to have any other options for PVCs.   Echo 06/24/21 EF 25-30% RV normal   Admitted 12/13/21 with COVID.  Treated with 5-day course of Lagevrio.  Discharged to home on 12/16/21.   Seen in [facility] ED 12/05 with volume  overload. Given total of 120 mg IV lasix and discharged with instructions to take 5 mg metolazone X 2 days.  Saw Dr. Lovena Le for f/u 12/11. Torsemide doubled for 3 days d/t symptoms and elevated HL Index.  Here today for close f/u. She's had good UOP with increased dose of Torsemide. Reports that she has lost 14 lb at home since she was in ED on 12/05. Home weight 191 lb today. She is not having dyspnea, reports chest does feel tight at times. Had some orthopnea and PND about a week ago. Now reports feeling more thirsty, fatigued and a little whoozy when she stands. She wonders if she may be getting dehydrated.  [employer] ICD: HL Index 61, activity level 0.9 hrs/day, 14 second run NSVT on 12/09, no shocks  ReDS 34%  Cardiac Studies  - Cath (6/17): revealed normal vessels, moderate elevation in pulmonary artery pressures, and LVEF 25-30%.   - RHC (6/21):   RA = 4 RV = 32/6 PA = 31/8 (21) PCW = 12 Fick cardiac output/index = 7.0/3.4 Thermo CO/CI = 5.2/2.5 PVR =1.7 (Thermo) Ao sat = 98% PA sat = 67%, 69%   - Echo 6/15 35-40% - Echo 6/17 25-30% - Echo 12/17 20-25% - Echo 8/18 EF 25% - Echo 7/19: EF 50-55% - Echo 6/20: EF 25-30% - Echo 11/13/19: Ef < 20% RV normal  - Echo 4/22: EF 20-25%, RV normal    - CPX (  2/18): FVC 2.57 (79%)      FEV1 2.14 (81%)        FEV1/FVC 83 (101%)        MVV 107 (102%) Resting HR: 106 Peak HR: 166   (93% age predicted max HR) BP rest: 136/98 BP peak: 174/88 Peak VO2: 17.1 (85% predicted peak VO2) - corrects to 28.4 for ibw VE/VCO2 slope:  33 OUES: 2.16 Peak RER: 1.09 Ventilatory Threshold: 14.6 (73% predicted or measured peak VO2) VE/MVV:  59% PETCO2 at peak:  33 O2pulse:  10   (83% predicted O2pulse)   - CPX (12/18): pVO2 14.0 (corrects to 23.0 for IBW) VeVCO2  32 RER 1.0  - PFTs w/ DLOC (4/22) Mild Restriction Normal Diffusion   Review of systems complete and found to be negative unless listed in HPI.    Past Medical History:   Diagnosis Date   Acute on chronic systolic (congestive) heart failure (HCC) 04/29/2017   Acute pain of right shoulder 06/16/2017   AICD (automatic cardioverter/defibrillator) present    Anginal pain (HCC)    Anxiety    Arthritis    right shoulder    Asthma    CHF (congestive heart failure) ([place])    Chronic combined systolic and diastolic heart failure ([place]) 03/05/2014   Closed low lateral malleolus fracture 10/23/2013   Cystitis 10/21/2017   Depression    Depression with anxiety 01/20/2013   Diabetes mellitus without complication (HCC)    Diverticulosis    Dyspnea    comes and goes intermittently mostly with exertion    Dysrhythmia    Essential hypertension    Prev followed by [name]    Fibroid    age 44   Gallstones    Generalized abdominal cramping    History of cardiomyopathy    Hypertension    IBS (irritable bowel syndrome)    ICD (implantable cardioverter-defibrillator), single, 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 CPAP intolerance >> "smothering" Good tolerance of auto device (optimal pressure 12-13 on download).  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     albuterol (PROAIR HFA) 108 (90 Base) MCG/ACT inhaler Inhale 1-2 puffs into the lungs every 6 (six) hours as needed for wheezing or shortness of breath. 18 g 0   amiodarone (PACERONE) 200 MG tablet Take 1 tablet (200 mg total) by mouth daily. 90 tablet 3   budesonide-formoterol (SYMBICORT) 160-4.5 MCG/ACT inhaler Inhale 2 puffs into the lungs 2 (two) times  daily as needed. 10 g 3   CORLANOR 7.5 MG TABS tablet TAKE 1 TABLET BY MOUTH TWICE DAILY WITH A MEAL 60 tablet 11   dicyclomine (BENTYL) 20 MG tablet Take 1 tablet (20 mg total) by mouth daily as needed for spasms. 90 tablet 3   empagliflozin (JARDIANCE) 25 MG TABS tablet Take 1 tablet (25 mg total) by mouth daily before breakfast. 90 tablet 3   fluticasone (FLONASE) 50 MCG/ACT nasal spray Place 2 sprays into both nostrils daily as needed for allergies. 16 g 3   gabapentin (NEURONTIN) 300 MG capsule Take 1 capsule (300 mg total) by mouth at bedtime as needed (back spasms). 90 capsule 3   Levonorgestrel-Ethinyl Estradiol (AMETHIA) 0.15-0.03 &0.01 MG tablet Take 1 tablet by mouth daily. 91 tablet 4   lidocaine (LIDODERM) 5 % Place 1 patch onto the skin daily. Remove & Discard patch within 12 hours or as directed by MD 90 patch 3   Magnesium Oxide 200 MG TABS Take 1 tablet (200 mg total) by mouth daily. 90 tablet 3   metoprolol succinate (TOPROL-XL) 50 MG 24 hr tablet Take 1.5 tablets (75 mg total) by mouth daily. Take with or immediately following a meal. 135 tablet 3   mexiletine (MEXITIL) 200 MG capsule Take 1 capsule (200 mg total) by mouth 2 (two) times daily. 60 capsule 4   Multiple Vitamins-Minerals (MULTIVITAMIN GUMMIES WOMENS PO) Take 2 tablets by mouth daily.     nitroGLYCERIN (NITROSTAT) 0.4 MG SL tablet Place 1 tablet (0.4 mg total) under the tongue every 5 (five) minutes as  needed for chest pain. 75 tablet 0   potassium chloride SA (KLOR-CON M) 20 MEQ tablet Take 1 tablet (20 mEq total) by mouth daily. 90 tablet 3   sacubitril-valsartan (ENTRESTO) 97-103 MG Take 1 tablet by mouth 2 (two) times daily. 60 tablet 3   spironolactone (ALDACTONE) 25 MG tablet Take 1 tablet (25 mg total) by mouth at bedtime. 90 tablet 3   tiZANidine (ZANAFLEX) 2 MG tablet TAKE 1 TABLET BY MOUTH AT BEDTIME 30 tablet 0   torsemide (DEMADEX) 20 MG tablet Take 20 mg by mouth daily. Take an extra tablet ( 20 mg ), Monday 05/04/22, Tuesday 05/05/22, and Wednesday 05/06/22 only.  Then, take as ordered, 1 tablet ( 20 mg ), by mouth daily.     Ubrogepant (UBRELVY) 100 MG TABS Take 100 mg by mouth daily as needed (migraine). 30 tablet 3   Vitamin D, Ergocalciferol, (DRISDOL) 1.25 MG (50000 UNIT) CAPS capsule Take 1 capsule (50,000 Units total) by mouth every 7 (seven) days. 12 capsule 0   No current facility-administered medications for this encounter.   Allergies  Allergen Reactions   Vancomycin Other (See Comments)    "did something to my kidneys," PROGRESSED TO KIDNEY FAILURE!!   Aspirin Other (See Comments)    Wheezing, (Pt states that she just wheezes some when she takes aspirin by itself but she can take aspirin in a combination product).   Contrast Media [Iodinated Contrast Media] Other (See Comments)    Multiple CT contrast studies done over 2 weeks caused ARF   Ciprofloxacin Itching and Rash   Cyclobenzaprine Other (See Comments)    Can not tolerate   Farxiga [Dapagliflozin] Rash   Sulfa Antibiotics Itching and Rash   Social History   Socioeconomic History   Marital status: Married    Spouse name: Not on file   Number of children: 1   Years of education: Not on file  Highest education level: Not on file  Occupational History   Occupation: stay at home mom  Tobacco Use   Smoking status: Never   Smokeless tobacco: Never  Vaping Use   Vaping Use: Never used  Substance and  Sexual Activity   Alcohol use: No   Drug use: No   Sexual activity: Yes  Other Topics Concern   Not on file  Social History Narrative   Not on file   Social Determinants of Health   Financial Resource Strain: Not on file  Food Insecurity: No Food Insecurity (08/23/2019)   Hunger Vital Sign    Worried About Running Out of Food in the Last Year: Never true    Ran Out of Food in the Last Year: Never true  Transportation Needs: No Transportation Needs (08/23/2019)   PRAPARE - Hydrologist (Medical): No    Lack of Transportation (Non-Medical): No  Physical Activity: Insufficiently Active (11/28/2019)   Exercise Vital Sign    Days of Exercise per Week: 2 days    Minutes of Exercise per Session: 40 min  Stress: Not on file  Social Connections: Socially Isolated (11/28/2019)   Social Connection and Isolation Panel [NHANES]    Frequency of Communication with Friends and Family: Twice a week    Frequency of Social Gatherings with Friends and Family: Never    Attends Religious Services: Never    Marine scientist or Organizations: No    Attends Music therapist: Never    Marital Status: Married  Human resources officer Violence: Not on file   Family History  Problem Relation Age of Onset   Emphysema Maternal Grandmother        smoked   Heart disease Maternal Grandmother 67       MI   Rheum arthritis Mother    Allergies Daughter    Colon cancer Neg Hx    BP 94/72   Pulse 70   Wt 88.5 kg (195 lb)   LMP  (LMP Unknown) Comment: Currently  SpO2 98%   BMI 32.45 kg/m   Wt Readings from Last 3 Encounters:  05/06/22 88.5 kg (195 lb)  05/04/22 91.2 kg (201 lb)  05/04/22 91.1 kg (200 lb 12.8 oz)   General:  Well appearing. Ambulated into clinic. HEENT: normal Neck: supple. JVP 8 cm. Carotids 2+ bilat; no bruits.  Cor: PMI nondisplaced. Regular rate & rhythm. No rubs, gallops or murmurs. Lungs: clear Abdomen: soft, nontender, nondistended.   Extremities: no cyanosis, clubbing, rash, edema Neuro: alert & orientedx3, cranial nerves grossly intact. moves all 4 extremities w/o difficulty. Affect pleasant  ASSESSMENT & PLAN:  1. Chronic systolic HF:  due to peri-partum CM, onset 1999. S/P [employer] ICD 07/2016 - LHC 6/17 No CAD - EF 20-25% (12/17).  - Echo 8/18 EF 25-30% s/p ICD 3/18. [place] 3/19: RA 2, PA 28/13, Fick CO 5.7, CI 2.8 - CPX 12/18 showed moderate limitation due mostly to obesity with some HF component - Echo 11/22/17 LVEF 50-55%, Grade 1 DD, Mild MR, Mod LAE - Echo 04/2018 EF 20-25%. RV ok  - Echo 10/2019 EF < 20%. - Echo 08/2020 EF 20-25%, RV normal  - Has Barostim 10/2020   - Echo 06/24/21 25-30%  - Echo 07/23: EF 25-30%,  - NYHA III. Volume looks okay on exam. However, HL index continues to trend up, 61 today. Her ReDS is only 34%. She has recently diuresed 14 lb. ? If HL index is accurate.  Will check BNP today with [facility]. - Will have her continue on Torsemide at prior dose, 20 mg daily. Given instructions for when to use extra Torsemide PRN. If develops volume overload that does not respond to extra diuretic she will call the clinic. Would then need to add metolazone or consider furoscix. She agrees with the plan. - Continue jardiance 25 mg daily - Continue entesto 97-103 mg twice a day.  - continue spiro 25 mg daily at bed time.  - Continue Toprol XL 75 mg daily    - Continue corlanor 7.5 mg BID for now.  - BP soft today, elevated at f/u 12/11. ? D/t low volume status. She will monitor at home and call if SBP consistently less than 100. No changes to meds for now.  2. Frequent PVCs/ Bigeminy - Holter Monitor 6/19 with 30% PVCs with one primary morphology.  - S/p PVC ablation 11/2017 with Dr Lovena Le unsuccessful - Off flecainide with EF down and persistent PVCs.  - On amio 200 daily - Zio Patch 04/2019 > 17% PVCs and >8% couplets.  - Zio Patch 11/28/2019 8.5 % PVCs - Zio 9/22 13% PVCs (unifocal)  - Per Dr.  Lovena Le, due to the location of her PVCs, she is not a candidate for re-do ablation. Could consider referral to tertiary center, but success rate is not any more likely.  - Started mexiletine 200 BID in 01/23. If successful will try to wean amio. If not consider referral to Dr. Lenard Galloway to discuss ablation.  - Check 14 day zio to reassess PVC burden  3. Morbid obesity - s/p Gastric Sleeve at [facility] 09/20/17  -Body mass index is 32.45 kg/m.   4. Daytime Fatigue - Sleep study in 2019 and again in 2021 were both negative for sleep apnea   5. DM2 - On jardiance - Per PCP   F/U 3-4 weeks with APP, sooner if needed    [facility], Norabelle Kondo N, PA-C 05/06/22

## 2022-05-04 NOTE — Progress Notes (Signed)
HPI [name] returns today for followup. She is a pleasant 46 yo woman with a h/o chronic systolic heart failure, HTN, obesity and lung disease. She has done well in the interim. Her CHF symptoms are class 3.  She denies syncope or ICD shocks. No chest pain.  She has had worsening CHF and is being followed by the CHF team. She notes more dyspnea and orthopnea.  Allergies  Allergen Reactions   Vancomycin Other (See Comments)    "did something to my kidneys," PROGRESSED TO KIDNEY FAILURE!!   Aspirin Other (See Comments)    Wheezing, (Pt states that she just wheezes some when she takes aspirin by itself but she can take aspirin in a combination product).   Contrast Media [Iodinated Contrast Media] Other (See Comments)    Multiple CT contrast studies done over 2 weeks caused ARF   Ciprofloxacin Itching and Rash   Cyclobenzaprine Other (See Comments)    Can not tolerate   [place] [Dapagliflozin] Rash   Sulfa Antibiotics Itching and Rash     Current Outpatient Medications  Medication Sig Dispense Refill   acetaminophen (TYLENOL) 650 MG CR tablet Take 650 mg by mouth every 8 (eight) hours as needed for pain (headache).     albuterol (PROAIR HFA) 108 (90 Base) MCG/ACT inhaler Inhale 1-2 puffs into the lungs every 6 (six) hours as needed for wheezing or shortness of breath. 18 g 0   amiodarone (PACERONE) 200 MG tablet Take 1 tablet (200 mg total) by mouth daily. 90 tablet 3   budesonide-formoterol (SYMBICORT) 160-4.5 MCG/ACT inhaler Inhale 2 puffs into the lungs 2 (two) times daily as needed. 10 g 3   CORLANOR 7.5 MG TABS tablet TAKE 1 TABLET BY MOUTH TWICE DAILY WITH A MEAL 60 tablet 11   dicyclomine (BENTYL) 20 MG tablet Take 1 tablet (20 mg total) by mouth daily as needed for spasms. 90 tablet 3   empagliflozin (JARDIANCE) 25 MG TABS tablet Take 1 tablet (25 mg total) by mouth daily before breakfast. 90 tablet 3   fluticasone (FLONASE) 50 MCG/ACT nasal spray Place 2 sprays into both  nostrils daily as needed for allergies. 16 g 3   gabapentin (NEURONTIN) 300 MG capsule Take 1 capsule (300 mg total) by mouth at bedtime as needed (back spasms). 90 capsule 3   lidocaine (LIDODERM) 5 % Place 1 patch onto the skin daily. Remove & Discard patch within 12 hours or as directed by MD 90 patch 3   Magnesium Oxide 200 MG TABS Take 1 tablet (200 mg total) by mouth daily. 90 tablet 3   metolazone (ZAROXOLYN) 5 MG tablet Take 1 tablet with additional 40 mEq of Potassium daily x 2 days only. 2 tablet 03   metoprolol succinate (TOPROL-XL) 50 MG 24 hr tablet Take 1.5 tablets (75 mg total) by mouth daily. Take with or immediately following a meal. 135 tablet 3   mexiletine (MEXITIL) 200 MG capsule Take 1 capsule (200 mg total) by mouth 2 (two) times daily. 60 capsule 4   Multiple Vitamins-Minerals (MULTIVITAMIN GUMMIES WOMENS PO) Take 2 tablets by mouth daily.     nitroGLYCERIN (NITROSTAT) 0.4 MG SL tablet Place 1 tablet (0.4 mg total) under the tongue every 5 (five) minutes as needed for chest pain. 75 tablet 0   potassium chloride SA (KLOR-CON M) 20 MEQ tablet Take 1 tablet (20 mEq total) by mouth daily. 90 tablet 3   sacubitril-valsartan (ENTRESTO) 97-103 MG Take 1 tablet by  mouth 2 (two) times daily. 60 tablet 3   spironolactone (ALDACTONE) 25 MG tablet Take 1 tablet (25 mg total) by mouth at bedtime. 90 tablet 3   tiZANidine (ZANAFLEX) 2 MG tablet TAKE 1 TABLET BY MOUTH AT BEDTIME 30 tablet 0   torsemide (DEMADEX) 20 MG tablet Take 20 mg by mouth daily.     Ubrogepant (UBRELVY) 100 MG TABS Take 100 mg by mouth daily as needed (migraine). 30 tablet 3   Vitamin D, Ergocalciferol, (DRISDOL) 1.25 MG (50000 UNIT) CAPS capsule Take 1 capsule (50,000 Units total) by mouth every 7 (seven) days. 12 capsule 0   No current facility-administered medications for this visit.     Past Medical History:  Diagnosis Date   Acute on chronic systolic (congestive) heart failure (HCC) 04/29/2017   Acute  pain of right shoulder 06/16/2017   AICD (automatic cardioverter/defibrillator) present    Anginal pain (HCC)    Anxiety    Arthritis    right shoulder    Asthma    CHF (congestive heart failure) ([place])    Chronic combined systolic and diastolic heart failure ([place]) 03/05/2014   Closed low lateral malleolus fracture 10/23/2013   Cystitis 10/21/2017   Depression    Depression with anxiety 01/20/2013   Diabetes mellitus without complication (HCC)    Diverticulosis    Dyspnea    comes and goes intermittently mostly with exertion    Dysrhythmia    Essential hypertension    Prev followed by [name]    Fibroid    age 31   Gallstones    Generalized abdominal cramping    History of cardiomyopathy    Hypertension    IBS (irritable bowel syndrome)    ICD (implantable cardioverter-defibrillator), single, 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 CPAP intolerance >> "smothering" Good tolerance of auto device (optimal pressure 12-13 on download).  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   Past Surgical History:  Procedure Laterality Date    CARDIAC CATHETERIZATION N/A 11/11/2015   Procedure: Right/Left Heart Cath and Coronary Angiography;  Surgeon: Troy Sine, MD;  Location: [facility];  Service: Cardiovascular;  Laterality: N/A;   CARDIAC CATHETERIZATION  ~ 2015   CARDIAC DEFIBRILLATOR PLACEMENT  08/03/2016   CESAREAN SECTION  1999   COLONOSCOPY WITH PROPOFOL N/A 04/21/2016   Procedure: COLONOSCOPY WITH PROPOFOL;  Surgeon: Jerene Bears, MD;  Location: [facility];  Service: Gastroenterology;  Laterality: N/A;   ESOPHAGOGASTRODUODENOSCOPY (EGD) WITH PROPOFOL N/A 04/21/2016   Procedure: ESOPHAGOGASTRODUODENOSCOPY (EGD) WITH PROPOFOL;  Surgeon:  Jerene Bears, MD;  Location: [facility];  Service: Gastroenterology;  Laterality: N/A;   FOOT FRACTURE SURGERY Right ~ 2003   FRACTURE SURGERY     ICD IMPLANT N/A 08/03/2016   Procedure: ICD Implant;  Surgeon: Deboraha Sprang, MD;  Location: [facility];  Service: Cardiovascular;  Laterality: N/A;   LAPAROSCOPIC CHOLECYSTECTOMY  12/2006   LAPAROSCOPIC GASTRIC SLEEVE RESECTION     LAPAROSCOPY ABDOMEN DIAGNOSTIC  2008   "cut bile duct w/gallbladder OR; had to go in later & fix leak; hospitalized for 2 months"   LEFT HEART CATHETERIZATION WITH CORONARY ANGIOGRAM N/A 02/26/2014   Procedure: LEFT HEART CATHETERIZATION WITH CORONARY ANGIOGRAM;  Surgeon: Jettie Booze, MD;  Location: [facility];  Service: Cardiovascular;  Laterality: N/A;   OVARIAN CYST REMOVAL Right 1999   PVC ABLATION N/A 12/01/2017   Procedure: PVC ABLATION;  Surgeon: Evans Lance, MD;  Location: [place] [facility];  Service: Cardiovascular;  Laterality: N/A;   RIGHT HEART CATH N/A 08/05/2017   Procedure: RIGHT HEART CATH;  Surgeon: Jolaine Artist, MD;  Location: [place] [facility];  Service: Cardiovascular;  Laterality: N/A;   RIGHT HEART CATH N/A 11/16/2019   Procedure: RIGHT HEART CATH;  Surgeon: Jolaine Artist, MD;  Location: [place] [facility];  Service: Cardiovascular;  Laterality:  N/A;   TUBAL LIGATION  1999     Family History  Problem Relation Age of Onset   Emphysema Maternal Grandmother        smoked   Heart disease Maternal Grandmother 89       MI   Rheum arthritis Mother    Allergies Daughter    Colon cancer Neg Hx      Social History   Socioeconomic History   Marital status: Married    Spouse name: Not on file   Number of children: 1   Years of education: Not on file   Highest education level: Not on file  Occupational History   Occupation: stay at home mom  Tobacco Use   Smoking status: Never   Smokeless tobacco: Never  Vaping Use   Vaping Use: Never used  Substance and Sexual Activity   Alcohol use: No   Drug use: No   Sexual activity: Yes  Other Topics Concern   Not on file  Social History Narrative   Not on file   Social Determinants of Health   Financial Resource Strain: Not on file  Food Insecurity: No Food Insecurity (08/23/2019)   Hunger Vital Sign    Worried About Running Out of Food in the Last Year: Never true    Ran Out of Food in the Last Year: Never true  Transportation Needs: No Transportation Needs (08/23/2019)   PRAPARE - Hydrologist (Medical): No    Lack of Transportation (Non-Medical): No  Physical Activity: Insufficiently Active (11/28/2019)   Exercise Vital Sign    Days of Exercise per Week: 2 days    Minutes of Exercise per Session: 40 min  Stress: Not on file  Social Connections: Socially Isolated (11/28/2019)   Social Connection and Isolation Panel [NHANES]    Frequency of Communication with Friends and Family: Twice a week    Frequency of Social Gatherings with Friends and Family: Never    Attends Religious Services: Never    Marine scientist or Organizations: No    Attends Archivist Meetings: Never    Marital Status: Married  Intimate Partner Violence: Not on file     LMP  (LMP Unknown)   Physical Exam:  Well appearing NAD HEENT: Unremarkable Neck:   No JVD, no thyromegally Lymphatics:  No adenopathy Back:  No CVA tenderness Lungs:  Clear with no wheezes HEART:  Regular rate rhythm, no murmurs, no rubs, no clicks Abd:  soft, positive bowel sounds, no organomegally, no rebound, no guarding Ext:  2 plus pulses, no edema, no cyanosis, no clubbing Skin:  No rashes no nodules Neuro:  CN II through XII intact, motor grossly intact  EKG - nsr with frequent PVC's with triplets  DEVICE  Normal device function.  See PaceArt for details.   Assess/Plan:  PVCs - she is asymptomatic at this time. She will continue amiodarone. 2. Chronic systolic heart failure - her symptoms have worsened. She will followup with the device clinic. I asked her to take an extra 20 mg of torsemide for the next 3 days as her fluid monitor is up.   3. ICD - her [place] ICD is working normally. She has had no therapies and has about 9 years of battery longevity. 4. Obesity - I discussed the importance of weight loss and we discussed intermittnet fasting.   Carleene Overlie Bransyn Adami,MD

## 2022-05-04 NOTE — Patient Instructions (Signed)
We will refill the birth control pills.

## 2022-05-04 NOTE — Assessment & Plan Note (Signed)
Counseled about need for birth control continuously due to postpartum cardiomyopathy. Rx OCPs today and on cycle no need for pregnancy test.

## 2022-05-04 NOTE — Progress Notes (Signed)
Subjective:   Patient ID: [name], female    DOB: 06/21/1975, 46 y.o.   MRN: [id number]  HPI Here for medicare wellness and physical, no new complaints. Please see A/P for status and treatment of chronic medical problems.   Diet: heart healthy or DM if diabetic Physical activity: sedentary Depression/mood screen: negative Hearing: intact to whispered voice Visual acuity: grossly normal with lens, performs annual eye exam  ADLs: capable Fall risk: none Home safety: good Cognitive evaluation: intact to orientation, naming, recall and repetition EOL planning: adv directives discussed  [place] Visit from 05/04/2022 in [place] at [place] Office Visit from 05/04/2022 in [place] at [facility]  PHQ-9 Total Score 0         12/16/2021   11:31 AM 02/16/2022    2:01 PM 03/10/2022   10:37 AM 04/28/2022    2:42 AM 05/04/2022    1:54 PM  [place] in the past year?   1  0  Was there an injury with Fall?   0  0  Fall Risk Category Calculator   1  0  Fall Risk Category   Low  Low  Patient Fall Risk Level Low fall risk Low fall risk Low fall risk Low fall risk   Patient at Risk for Falls Due to   No Fall Risks    Fall risk Follow up   Falls evaluation completed  Falls evaluation completed    I have personally reviewed and have noted 1. The patient's medical and social history - reviewed today no changes 2. Their use of alcohol, tobacco or illicit drugs 3. Their current medications and supplements 4. The patient's functional ability including ADL's, fall risks, home safety risks and hearing or visual impairment. 5. Diet and physical activities 6. Evidence for depression or mood disorders 7. Care team reviewed and updated 8.  The patient is not on an opioid pain medication.  Patient Care Team: Hoyt Koch, MD as PCP - General (Internal Medicine) Bensimhon, Shaune Pascal, MD as PCP  - Advanced Heart Failure (Cardiology) Belva Crome, MD as PCP - Cardiology (Cardiology) Evans Lance, MD as PCP - Electrophysiology (Cardiology) Tanda Rockers, MD (Pulmonary Disease) Chucky May, MD (Psychiatry) Lelon Perla, MD (Cardiology) Rigoberto Noel, MD (Pulmonary Disease) Charlton Haws, [facility] as Pharmacist (Pharmacist) Past Medical History:  Diagnosis Date   Acute on chronic systolic (congestive) heart failure ([place]) 04/29/2017   Acute pain of right shoulder 06/16/2017   AICD (automatic cardioverter/defibrillator) present    Anginal pain ([place])    Anxiety    Arthritis    right shoulder    Asthma    CHF (congestive heart failure) ([place])    Chronic combined systolic and diastolic heart failure ([place]) 03/05/2014   Closed low lateral malleolus fracture 10/23/2013   Cystitis 10/21/2017   Depression    Depression with anxiety 01/20/2013   Diabetes mellitus without complication ([place])    Diverticulosis    Dyspnea    comes and goes intermittently mostly with exertion    Dysrhythmia    Essential hypertension    Prev followed by [name]    Fibroid    age 73   Gallstones    Generalized abdominal cramping    History of cardiomyopathy    Hypertension    IBS (irritable bowel syndrome)    ICD (implantable cardioverter-defibrillator),  single, 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   Insomnia 05/07/2017   Labral tear of shoulder 04/04/2015    Injected 04/04/2015 Injected 12/03/2015    Migraine    "monthly" (08/03/2016)   Myofascial pain 06/16/2017   NICM (nonischemic cardiomyopathy) ([place]) 08/03/2016   Nonallopathic lesion of lumbosacral region 11/16/2016   Nonallopathic lesion of sacral region 11/16/2016   Nonallopathic lesion of thoracic region 08/20/2014   Nonspecific chest pain 04/28/2017   OSA (obstructive sleep apnea) 01/02/2013   NPSG 2009:  AHI 9/hr. CPAP intolerance >> "smothering" Good tolerance of auto device (optimal pressure 12-13 on download).  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   PVC (premature ventricular contraction) 06/23/2016   Seizures ([place])    "as a child" (08/03/2016)   Termination of pregnancy    due to cardiac risk   Past Surgical History:  Procedure Laterality Date   CARDIAC CATHETERIZATION N/A 11/11/2015   Procedure: Right/Left Heart Cath and Coronary Angiography;  Surgeon: Troy Sine, MD;  Location: [place] [facility];  Service: Cardiovascular;  Laterality: N/A;   CARDIAC CATHETERIZATION  ~ 2015   CARDIAC DEFIBRILLATOR PLACEMENT  08/03/2016   CESAREAN SECTION  1999   COLONOSCOPY WITH PROPOFOL N/A 04/21/2016   Procedure: COLONOSCOPY WITH PROPOFOL;  Surgeon: Jerene Bears, MD;  [facility]: [facility];  Service: Gastroenterology;  Laterality: N/A;   ESOPHAGOGASTRODUODENOSCOPY (EGD) WITH PROPOFOL N/A 04/21/2016   Procedure: ESOPHAGOGASTRODUODENOSCOPY (EGD) WITH PROPOFOL;  Surgeon: Jerene Bears, MD;  [facility]: [facility];  Service: Gastroenterology;  Laterality: N/A;   FOOT FRACTURE SURGERY Right ~ 2003   FRACTURE SURGERY     ICD IMPLANT N/A 08/03/2016   Procedure: ICD Implant;  Surgeon: Deboraha Sprang, MD;  Location: [place] [facility];  Service: Cardiovascular;  Laterality: N/A;   LAPAROSCOPIC CHOLECYSTECTOMY  12/2006   LAPAROSCOPIC GASTRIC SLEEVE RESECTION     LAPAROSCOPY ABDOMEN DIAGNOSTIC  2008   "cut bile duct w/gallbladder OR; had to go in later & fix leak; hospitalized for 2 months"   LEFT HEART CATHETERIZATION WITH CORONARY ANGIOGRAM N/A 02/26/2014   Procedure: LEFT HEART CATHETERIZATION WITH CORONARY ANGIOGRAM;  Surgeon: Jettie Booze, MD;  Location: [facility] LAB;  Service: Cardiovascular;  Laterality: N/A;   OVARIAN CYST REMOVAL Right 1999   PVC ABLATION N/A 12/01/2017   Procedure: PVC ABLATION;  Surgeon: Evans Lance, MD;  Location: [place]  [facility];  Service: Cardiovascular;  Laterality: N/A;   RIGHT HEART CATH N/A 08/05/2017   Procedure: RIGHT HEART CATH;  Surgeon: Jolaine Artist, MD;  Location: [place] [facility];  Service: Cardiovascular;  Laterality: N/A;   RIGHT HEART CATH N/A 11/16/2019   Procedure: RIGHT HEART CATH;  Surgeon: Jolaine Artist, MD;  Location: [place] [facility];  Service: Cardiovascular;  Laterality: N/A;   TUBAL LIGATION  1999   Family History  Problem Relation Age of Onset   Emphysema Maternal Grandmother        smoked   Heart disease Maternal Grandmother 56       MI   Rheum arthritis Mother    Allergies Daughter    Colon cancer Neg Hx     Review of Systems  Constitutional:  Positive for appetite change and fatigue.  HENT: Negative.    Eyes: Negative.  Respiratory:  Positive for chest tightness and shortness of breath. Negative for cough.   Cardiovascular:  Negative for chest pain, palpitations and leg swelling.  Gastrointestinal:  Negative for abdominal distention, abdominal pain, constipation, diarrhea, nausea and vomiting.  Musculoskeletal: Negative.   Skin: Negative.   Neurological: Negative.   Psychiatric/Behavioral: Negative.      Objective:  Physical Exam Constitutional:      Appearance: She is well-developed.  HENT:     Head: Normocephalic and atraumatic.  Cardiovascular:     Rate and Rhythm: Normal rate and regular rhythm.  Pulmonary:     Effort: Pulmonary effort is normal. No respiratory distress.     Breath sounds: Normal breath sounds. No wheezing or rales.  Abdominal:     General: Bowel sounds are normal. There is no distension.     Palpations: Abdomen is soft.     Tenderness: There is no abdominal tenderness. There is no rebound.  Musculoskeletal:     Cervical back: Normal range of motion.  Skin:    General: Skin is warm and dry.  Neurological:     Mental Status: She is alert and oriented to person, place, and time.     Coordination: Coordination normal.      Vitals:   05/04/22 1346 05/04/22 1353 05/04/22 1412  BP: (!) 160/100 (!) 160/100 138/83  Pulse: 88    Temp: 98.4 F (36.9 C)    TempSrc: Oral    SpO2: 97%    Weight: 201 lb (91.2 kg)    Height: '5\' 5"'$  (1.651 m)      Assessment & Plan:

## 2022-05-04 NOTE — Assessment & Plan Note (Signed)
Uses ubrelvy for migraines. Can refill if needed.

## 2022-05-04 NOTE — Assessment & Plan Note (Signed)
Flu shot counseled declines. Covid-19 counseled. Tetanus due at pharmacy. Colonoscopy due 2027. Mammogram due 2024, pap smear due getting Wednesday at ob/gyn. Counseled about sun safety and mole surveillance. Counseled about the dangers of distracted driving. Given 10 year screening recommendations.

## 2022-05-04 NOTE — Assessment & Plan Note (Signed)
BP initially elevated and in acute systolic heart failure flare which explains. Keep corlanor 7.5 mg BID and torsemide and amiodarone and metoprolol and mexiletine and entresto and spironolactone. BMP drawn this morning with cardiology.

## 2022-05-04 NOTE — Assessment & Plan Note (Signed)
Was improved with OCP control then ran out and now Hg dropped 1 point with bleeding for more than 1 month. Refilled ocp today.

## 2022-05-04 NOTE — Patient Instructions (Addendum)
Medication Instructions:  Your physician has recommended you make the following change in your medication: Torsemide- Take an extra 20 mg tablet, Monday, ( Today 05/04/2022 ) Tuesday, and Wednesday only.    Lab Work: Today you will have blood drawn: BMET  Testing/Procedures: None ordered.  Follow-Up: At [facility], you and your health needs are our priority.  As part of our continuing mission to provide you with exceptional heart care, we have created designated Provider Care Teams.  These Care Teams include your primary Cardiologist (physician) and Advanced Practice Providers (APPs -  Physician Assistants and Nurse Practitioners) who all work together to provide you with the care you need, when you need it.  We recommend signing up for the patient portal called "MyChart".  Sign up information is provided on this After Visit Summary.  MyChart is used to connect with patients for Virtual Visits (Telemedicine).  Patients are able to view lab/test results, encounter notes, upcoming appointments, etc.  Non-urgent messages can be sent to your provider as well.   To learn more about what you can do with MyChart, go to [email or web address].    Your next appointment:   1 year(s)  The format for your next appointment:   In Person  Provider:   Cristopher Peru, MD{or one of the following Advanced Practice Providers on your designated Care Team:   Tommye Standard, [place] Legrand Como "Jonni Sanger" Chalmers Cater, [place]  Remote monitoring is used to monitor your ICD from home. This monitoring reduces the number of office visits required to check your device to one time per year. It allows [place] to keep an eye on the functioning of your device to ensure it is working properly. You are scheduled for a device check from home on 05/12/23. You may send your transmission at any time that day. If you have a wireless device, the transmission will be sent automatically. After your physician reviews your transmission, you will receive a  postcard with your next transmission date.  Important Information About Sugar

## 2022-05-04 NOTE — Progress Notes (Signed)
46 y.o. G57P0011 Married [place] American female here for NEW GYN/ annual exam.  Pt wants to discuss irregular bleeding. She wants to discuss menopause and a hysterectomy.  Had postpartum cardiomyopathy and developed CHF. Went to the ER last week due to increased symptoms of CHF.  Has a defibrillator and Barostim. Deals with a lot of PVCs.   Status post right ovarian cystectomy, 1999, at 5 mo pregnant.  C/S in 1999. Laparoscopic BTL in 1999.   Always has had heavy menses. She developed anemia. She is on combined oral contraception for about one year.  Now she is having breakthrough bleeding for about 5 months. Starts and stops.  No missed pills.  She feels clots coming out during her cycle, so she starts the next pack early.   Hgb 11.3 on 04/28/22 - ER.   Has migraines with potential auditory signal prior to the headache.  PCP:   Dr. Sharlet Salina Cardiology:  Dr. Zoila Shutter and Dr. Lovena Le  [name] last menstrual period was 04/24/2022.     Period Pattern: (!) Irregular Menstrual Flow: Heavy Menstrual Control: Tampon, Maxi pad (Pt have a very heavy flow.) Dysmenorrhea: (!) Moderate Dysmenorrhea Symptoms: Headache     Sexually active: Yes.    The current method of family planning is tubal ligation/ OCP Exercising: No.   Smoker:  no  Health Maintenance: Pap:  2019, WNL per pt, 08/01/10, normal History of abnormal Pap:  no MMG:  2019 per pt, WNL Colonoscopy:  04/21/16 BMD:   n/a  Result  n/a TDaP:  02/15/13 Gardasil:   no HIV: pt thinks it was done last year, NR Hep C: unsure Screening Labs:  PCP and cardiology.   reports that she has never smoked. She has never used smokeless tobacco. She reports that she does not drink alcohol and does not use drugs.  Past Medical History:  Diagnosis Date   Acute on chronic systolic (congestive) heart failure (HCC) 04/29/2017   Acute pain of right shoulder 06/16/2017   AICD (automatic cardioverter/defibrillator) present    Anginal pain  (HCC)    Anxiety    Arthritis    right shoulder    Asthma    CHF (congestive heart failure) ([place])    Chronic combined systolic and diastolic heart failure ([place]) 03/05/2014   Closed low lateral malleolus fracture 10/23/2013   Cystitis 10/21/2017   Depression    Depression with anxiety 01/20/2013   Diabetes mellitus without complication (HCC)    Diverticulosis    Dyspnea    comes and goes intermittently mostly with exertion    Dysrhythmia    Essential hypertension    Prev followed by [name]    Fibroid    age 67   Gallstones    Generalized abdominal cramping    History of cardiomyopathy    Hypertension    IBS (irritable bowel syndrome)    ICD (implantable cardioverter-defibrillator), single, 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   Insomnia 05/07/2017   Labral tear of shoulder 04/04/2015    Injected 04/04/2015 Injected 12/03/2015    Migraine    "monthly" (08/03/2016)   Myofascial pain 06/16/2017   NICM (nonischemic cardiomyopathy) ([place]) 08/03/2016   Nonallopathic lesion of lumbosacral region 11/16/2016   Nonallopathic lesion of sacral region 11/16/2016   Nonallopathic lesion of thoracic region 08/20/2014   Nonspecific chest pain 04/28/2017   OSA (obstructive sleep apnea) 01/02/2013   NPSG 2009:  AHI 9/hr. CPAP intolerance >> "smothering" Good tolerance of auto device (optimal pressure 12-13 on download).  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   PVC (premature ventricular contraction) 06/23/2016   Seizures ([place])    "as a child" (08/03/2016)   Termination of pregnancy    due to cardiac risk    Past Surgical History:  Procedure Laterality Date   CARDIAC CATHETERIZATION N/A 11/11/2015   Procedure: Right/Left Heart Cath and Coronary Angiography;  Surgeon: Troy Sine, MD;  Location: [facility];  Service:  Cardiovascular;  Laterality: N/A;   CARDIAC CATHETERIZATION  ~ 2015   CARDIAC DEFIBRILLATOR PLACEMENT  08/03/2016   CESAREAN SECTION  1999   COLONOSCOPY WITH PROPOFOL N/A 04/21/2016   Procedure: COLONOSCOPY WITH PROPOFOL;  Surgeon: Jerene Bears, MD;  Location: [facility];  Service: Gastroenterology;  Laterality: N/A;   ESOPHAGOGASTRODUODENOSCOPY (EGD) WITH PROPOFOL N/A 04/21/2016   Procedure: ESOPHAGOGASTRODUODENOSCOPY (EGD) WITH PROPOFOL;  Surgeon: Jerene Bears, MD;  Location: [facility];  Service: Gastroenterology;  Laterality: N/A;   FOOT FRACTURE SURGERY Right ~ 2003   FRACTURE SURGERY     ICD IMPLANT N/A 08/03/2016   Procedure: ICD Implant;  Surgeon: Deboraha Sprang, MD;  Location: [facility];  Service: Cardiovascular;  Laterality: N/A;   LAPAROSCOPIC CHOLECYSTECTOMY  12/2006   LAPAROSCOPIC GASTRIC SLEEVE RESECTION     LAPAROSCOPY ABDOMEN DIAGNOSTIC  2008   "cut bile duct w/gallbladder OR; had to go in later & fix leak; hospitalized for 2 months"   LEFT HEART CATHETERIZATION WITH CORONARY ANGIOGRAM N/A 02/26/2014   Procedure: LEFT HEART CATHETERIZATION WITH CORONARY ANGIOGRAM;  Surgeon: Jettie Booze, MD;  Location: [facility];  Service: Cardiovascular;  Laterality: N/A;   OVARIAN CYST REMOVAL Right 1999   PVC ABLATION N/A 12/01/2017   Procedure: PVC ABLATION;  Surgeon: Evans Lance, MD;  Location: [facility];  Service: Cardiovascular;  Laterality: N/A;   RIGHT HEART CATH N/A 08/05/2017   Procedure: RIGHT HEART CATH;  Surgeon: Jolaine Artist, MD;  Location: [facility];  Service: Cardiovascular;  Laterality: N/A;   RIGHT HEART CATH N/A 11/16/2019   Procedure: RIGHT HEART CATH;  Surgeon: Jolaine Artist, MD;  Location: [facility];  Service: Cardiovascular;  Laterality: N/A;   TUBAL LIGATION  1999    Current Outpatient Medications  Medication Sig Dispense Refill   acetaminophen (TYLENOL) 650 MG CR tablet Take 650 mg by mouth every 8  (eight) hours as needed for pain (headache).     albuterol (PROAIR HFA) 108 (90 Base) MCG/ACT inhaler Inhale 1-2 puffs into the lungs every 6 (six) hours as needed for wheezing or shortness of breath. 18 g 0   amiodarone (PACERONE) 200 MG tablet Take 1 tablet (200 mg total) by mouth daily. 90 tablet 3   budesonide-formoterol (SYMBICORT) 160-4.5 MCG/ACT inhaler Inhale 2 puffs into the lungs 2 (two) times daily as needed. 10 g 3   CORLANOR 7.5 MG TABS tablet TAKE 1 TABLET BY MOUTH TWICE DAILY WITH A MEAL 60 tablet 11   dicyclomine (BENTYL) 20 MG tablet Take 1 tablet (20 mg total) by mouth daily as needed for spasms. 90 tablet 3   empagliflozin (JARDIANCE) 25 MG TABS tablet Take 1 tablet (25 mg total) by mouth daily before breakfast. 90 tablet 3   fluticasone (FLONASE) 50 MCG/ACT nasal spray Place 2  sprays into both nostrils daily as needed for allergies. 16 g 3   gabapentin (NEURONTIN) 300 MG capsule Take 1 capsule (300 mg total) by mouth at bedtime as needed (back spasms). 90 capsule 3   lidocaine (LIDODERM) 5 % Place 1 patch onto the skin daily. Remove & Discard patch within 12 hours or as directed by MD 90 patch 3   Magnesium Oxide 200 MG TABS Take 1 tablet (200 mg total) by mouth daily. 90 tablet 3   metoprolol succinate (TOPROL-XL) 50 MG 24 hr tablet Take 1.5 tablets (75 mg total) by mouth daily. Take with or immediately following a meal. 135 tablet 3   mexiletine (MEXITIL) 200 MG capsule Take 1 capsule (200 mg total) by mouth 2 (two) times daily. 60 capsule 4   Multiple Vitamins-Minerals (MULTIVITAMIN GUMMIES WOMENS PO) Take 2 tablets by mouth daily.     nitroGLYCERIN (NITROSTAT) 0.4 MG SL tablet Place 1 tablet (0.4 mg total) under the tongue every 5 (five) minutes as needed for chest pain. 75 tablet 0   norethindrone (ORTHO MICRONOR) 0.35 MG tablet Take 1 tablet (0.35 mg total) by mouth daily. 84 tablet 0   potassium chloride SA (KLOR-CON M) 20 MEQ tablet Take 1 tablet (20 mEq total) by mouth  daily. 90 tablet 3   sacubitril-valsartan (ENTRESTO) 97-103 MG Take 1 tablet by mouth 2 (two) times daily. 60 tablet 3   spironolactone (ALDACTONE) 25 MG tablet Take 1 tablet (25 mg total) by mouth at bedtime. 90 tablet 3   tiZANidine (ZANAFLEX) 2 MG tablet TAKE 1 TABLET BY MOUTH AT BEDTIME 30 tablet 0   torsemide (DEMADEX) 20 MG tablet Take 20 mg by mouth daily. Take an extra tablet ( 20 mg ), Monday 05/04/22, Tuesday 05/05/22, and Wednesday 05/06/22 only.  Then, take as ordered, 1 tablet ( 20 mg ), by mouth daily.     Ubrogepant (UBRELVY) 100 MG TABS Take 100 mg by mouth daily as needed (migraine). 30 tablet 3   Vitamin D, Ergocalciferol, (DRISDOL) 1.25 MG (50000 UNIT) CAPS capsule Take 1 capsule (50,000 Units total) by mouth every 7 (seven) days. 12 capsule 0   No current facility-administered medications for this visit.    Family History  Problem Relation Age of Onset   Emphysema Maternal Grandmother        smoked   Heart disease Maternal Grandmother 65       MI   Rheum arthritis Mother    Allergies Daughter    Colon cancer Neg Hx     Review of Systems  All other systems reviewed and are negative.   Exam:   BP 102/76 (BP Location: Left Arm, Patient Position: Sitting, Cuff Size: Large)   Pulse (!) 50 Comment: Pt has congestive heart failure  Ht '5\' 6"'$  (1.676 m)   Wt 194 lb (88 kg) Comment: w/ shoes  LMP 04/24/2022 Comment: .  SpO2 98%   BMI 31.31 kg/m     General appearance: alert, cooperative and appears stated age Head: normocephalic, without obvious abnormality, atraumatic Neck: no adenopathy, supple, symmetrical, trachea midline and thyroid normal to inspection and palpation Lungs: clear to auscultation bilaterally Breasts: normal appearance, no masses or tenderness of the breasts, implantable devices palpable in the left upper and right upper chest wall just above breast tissue.  No nipple retraction or dimpling, No nipple discharge or bleeding, No axillary  adenopathy Heart: regular rate and rhythm Abdomen: soft, non-tender; no masses, no organomegaly Extremities: extremities normal, atraumatic, no cyanosis  or edema Skin: skin color, texture, turgor normal. No rashes or lesions Lymph nodes: cervical, supraclavicular, and axillary nodes normal. Neurologic: grossly normal  Pelvic: External genitalia:  no lesions              No abnormal inguinal nodes palpated.              Urethra:  normal appearing urethra with no masses, tenderness or lesions              Bartholins and Skenes: normal                 Vagina: normal appearing vagina with normal color and discharge, no lesions              Cervix: no lesions              Pap taken: yes Bimanual Exam:  Uterus:  normal size, contour, position, consistency, mobility, non-tender              Adnexa: no mass, fullness, tenderness              Rectal exam: yes.  Confirms.              Anus:  normal sphincter tone, no lesions  Chaperone was present for exam:  Kimalexis.   Assessment:   Encounter for breast and pelvic exam.  Abnormal uterine bleeding.  Possible migraine with aura. Contraceptive counseling.  Hx cardiomyopathy and CHF.  Has defibrillator and Barostim.  Plan: Mammogram screening discussed.  She will update. Self breast awareness reviewed. Pap and reflex HR HPV.  UPT:  negative Stop combined oral contraception and start Micronor.  Return for pelvic US.  I introduced the idea of a possible Mirena IUD based on the results of the ultrasound.  Follow up annually and prn.   After visit summary provided.   30  total time was spent for this patient encounter, including preparation, face-to-face counseling with the patient, coordination of care, and documentation of the encounter in addition to doing breast and pelvic exam.

## 2022-05-04 NOTE — Assessment & Plan Note (Signed)
No flare today using albuterol prn and symbicort.

## 2022-05-04 NOTE — Assessment & Plan Note (Signed)
With flare and currently doing increased fluid pills with cardiology and has close follow up with them. Labs done today already. Keep plan.

## 2022-05-05 ENCOUNTER — Encounter: Payer: Self-pay | Admitting: Hematology and Oncology

## 2022-05-05 LAB — BASIC METABOLIC PANEL
BUN/Creatinine Ratio: 8 — ABNORMAL LOW (ref 9–23)
BUN: 9 mg/dL (ref 6–24)
CO2: 23 mmol/L (ref 20–29)
Calcium: 9.2 mg/dL (ref 8.7–10.2)
Chloride: 104 mmol/L (ref 96–106)
Creatinine, Ser: 1.07 mg/dL — ABNORMAL HIGH (ref 0.57–1.00)
Glucose: 87 mg/dL (ref 70–99)
Potassium: 3.9 mmol/L (ref 3.5–5.2)
Sodium: 139 mmol/L (ref 134–144)
eGFR: 64 mL/min/{1.73_m2} (ref >59)

## 2022-05-05 NOTE — Progress Notes (Signed)
ICM remote transmission rescheduled from 05/04/2022 to 05/12/2023 due fluid levels checked at 12/11 office defib check by Dr Lovena Le.   Also will have fluid levels checked at Advanced Heart Failure clinic appointment 12/13.

## 2022-05-06 ENCOUNTER — Other Ambulatory Visit (HOSPITAL_COMMUNITY): Payer: Self-pay | Admitting: Internal Medicine

## 2022-05-06 ENCOUNTER — Other Ambulatory Visit (HOSPITAL_COMMUNITY)
Admission: RE | Admit: 2022-05-06 | Discharge: 2022-05-06 | Disposition: A | Discharge status: Home / Self Care | Payer: PPO | Source: Ambulatory Visit | Attending: Obstetrics and Gynecology | Admitting: Obstetrics and Gynecology

## 2022-05-06 ENCOUNTER — Encounter: Payer: Self-pay | Admitting: Hematology and Oncology

## 2022-05-06 ENCOUNTER — Ambulatory Visit (HOSPITAL_COMMUNITY)
Admission: RE | Admit: 2022-05-06 | Discharge: 2022-05-06 | Disposition: A | Discharge status: Home / Self Care | Payer: HMO | Source: Ambulatory Visit | Attending: Internal Medicine | Admitting: Internal Medicine

## 2022-05-06 ENCOUNTER — Telehealth: Payer: Self-pay

## 2022-05-06 ENCOUNTER — Ambulatory Visit (HOSPITAL_COMMUNITY)
Admit: 2022-05-06 | Discharge: 2022-05-06 | Disposition: A | Discharge status: Home / Self Care | Payer: HMO | Source: Ambulatory Visit | Attending: Physician Assistant | Admitting: Physician Assistant

## 2022-05-06 ENCOUNTER — Encounter (HOSPITAL_COMMUNITY): Payer: Self-pay

## 2022-05-06 ENCOUNTER — Encounter: Payer: Self-pay | Admitting: Obstetrics and Gynecology

## 2022-05-06 ENCOUNTER — Ambulatory Visit (INDEPENDENT_AMBULATORY_CARE_PROVIDER_SITE_OTHER): Payer: PPO | Admitting: Obstetrics and Gynecology

## 2022-05-06 VITALS — BP 94/72 | HR 70 | Wt 195.0 lb

## 2022-05-06 VITALS — BP 102/76 | HR 50 | Ht 66.0 in | Wt 194.0 lb

## 2022-05-06 DIAGNOSIS — I493 Ventricular premature depolarization: Secondary | ICD-10-CM | POA: Diagnosis not present

## 2022-05-06 DIAGNOSIS — I110 Hypertensive heart disease with heart failure: Secondary | ICD-10-CM | POA: Diagnosis not present

## 2022-05-06 DIAGNOSIS — Z95810 Presence of automatic (implantable) cardiac defibrillator: Secondary | ICD-10-CM | POA: Insufficient documentation

## 2022-05-06 DIAGNOSIS — Z308 Encounter for other contraceptive management: Secondary | ICD-10-CM

## 2022-05-06 DIAGNOSIS — Z124 Encounter for screening for malignant neoplasm of cervix: Secondary | ICD-10-CM | POA: Insufficient documentation

## 2022-05-06 DIAGNOSIS — N939 Abnormal uterine and vaginal bleeding, unspecified: Secondary | ICD-10-CM | POA: Diagnosis not present

## 2022-05-06 DIAGNOSIS — Z7984 Long term (current) use of oral hypoglycemic drugs: Secondary | ICD-10-CM | POA: Diagnosis not present

## 2022-05-06 DIAGNOSIS — R5383 Other fatigue: Secondary | ICD-10-CM | POA: Insufficient documentation

## 2022-05-06 DIAGNOSIS — Z6832 Body mass index (BMI) 32.0-32.9, adult: Secondary | ICD-10-CM | POA: Insufficient documentation

## 2022-05-06 DIAGNOSIS — I428 Other cardiomyopathies: Secondary | ICD-10-CM

## 2022-05-06 DIAGNOSIS — G4733 Obstructive sleep apnea (adult) (pediatric): Secondary | ICD-10-CM | POA: Diagnosis not present

## 2022-05-06 DIAGNOSIS — E119 Type 2 diabetes mellitus without complications: Secondary | ICD-10-CM | POA: Diagnosis not present

## 2022-05-06 DIAGNOSIS — Z86718 Personal history of other venous thrombosis and embolism: Secondary | ICD-10-CM | POA: Diagnosis not present

## 2022-05-06 DIAGNOSIS — Z79899 Other long term (current) drug therapy: Secondary | ICD-10-CM | POA: Diagnosis not present

## 2022-05-06 DIAGNOSIS — R008 Other abnormalities of heart beat: Secondary | ICD-10-CM | POA: Diagnosis not present

## 2022-05-06 DIAGNOSIS — Z01419 Encounter for gynecological examination (general) (routine) without abnormal findings: Secondary | ICD-10-CM | POA: Diagnosis not present

## 2022-05-06 DIAGNOSIS — Z9884 Bariatric surgery status: Secondary | ICD-10-CM | POA: Insufficient documentation

## 2022-05-06 DIAGNOSIS — I5022 Chronic systolic (congestive) heart failure: Secondary | ICD-10-CM

## 2022-05-06 LAB — BASIC METABOLIC PANEL WITH GFR
Anion gap: 11 (ref 5–15)
BUN: 8 mg/dL (ref 6–20)
CO2: 26 mmol/L (ref 22–32)
Calcium: 9.1 mg/dL (ref 8.9–10.3)
Chloride: 100 mmol/L (ref 98–111)
Creatinine, Ser: 1.3 mg/dL — ABNORMAL HIGH (ref 0.44–1.00)
GFR, Estimated: 51 mL/min — ABNORMAL LOW
Glucose, Bld: 95 mg/dL (ref 70–99)
Potassium: 3.2 mmol/L — ABNORMAL LOW (ref 3.5–5.1)
Sodium: 137 mmol/L (ref 135–145)

## 2022-05-06 LAB — BRAIN NATRIURETIC PEPTIDE: B Natriuretic Peptide: 251.3 pg/mL — ABNORMAL HIGH (ref 0.0–100.0)

## 2022-05-06 LAB — PREGNANCY, URINE: Preg Test, Ur: NEGATIVE

## 2022-05-06 MED ORDER — NORETHINDRONE 0.35 MG PO TABS: 1.0000 | ORAL_TABLET | Freq: Every day | ORAL | 0 refills | Status: DC

## 2022-05-06 NOTE — Telephone Encounter (Signed)
        Patient  visited [place] on 12/5   Telephone encounter attempt :  1st  A HIPAA compliant voice message was left requesting a return call.  Instructed patient to call back    [place]  [phone] [address], [place], [place] [address] Phone: [phone] Email: [email or web address]

## 2022-05-06 NOTE — Patient Instructions (Signed)
Labs done today. We will contact you only if your labs are abnormal.  No medication changes were made. Please continue all current medications as prescribed.  Your physician has requested that you regularly monitor and record your blood pressure readings at home. Please use the same machine at the same time of day to check your readings and record them to bring to your follow-up visit.  Your provider has recommended that  you wear a Zio Patch for 14 days.  This monitor will record your heart rhythm for our review.  IF you have any symptoms while wearing the monitor please press the button.  If you have any issues with the patch or you notice a red or orange light on it please call the company at [phone].  Once you remove the patch please mail it back to the company as soon as possible so we can get the results.  Your physician recommends that you schedule a follow-up appointment in: 3-4 weeks with our NP/PA Clinic here in our office.   If you have any questions or concerns before your next appointment please send [place] a message through [place] or call our office at [phone].    TO LEAVE A MESSAGE FOR THE NURSE SELECT OPTION 2, PLEASE LEAVE A MESSAGE INCLUDING: YOUR NAME DATE OF BIRTH CALL BACK NUMBER REASON FOR CALL**this is important as we prioritize the call backs  YOU WILL RECEIVE A CALL BACK THE SAME DAY AS LONG AS YOU CALL BEFORE 4:00 PM   Do the following things EVERYDAY: Weigh yourself in the morning before breakfast. Write it down and keep it in a log. Take your medicines as prescribed Eat low salt foods--Limit salt (sodium) to 2000 mg per day.  Stay as active as you can everyday Limit all fluids for the day to less than 2 liters   At the [facility], you and your health needs are our priority. As part of our continuing mission to provide you with exceptional heart care, we have created designated Provider Care Teams. These Care Teams include your  primary Cardiologist (physician) and Advanced Practice Providers (APPs- Physician Assistants and Nurse Practitioners) who all work together to provide you with the care you need, when you need it.   You may see any of the following providers on your designated Care Team at your next follow up: Dr Glori Bickers Dr Haynes Kerns, NP Lyda Jester, [place] Audry Riles, PharmD   Please be sure to bring in all your medications bottles to every appointment.

## 2022-05-06 NOTE — Patient Instructions (Signed)

## 2022-05-08 ENCOUNTER — Encounter: Payer: Self-pay | Admitting: Hematology and Oncology

## 2022-05-11 ENCOUNTER — Ambulatory Visit (INDEPENDENT_AMBULATORY_CARE_PROVIDER_SITE_OTHER): Payer: PPO

## 2022-05-11 ENCOUNTER — Encounter (HOSPITAL_COMMUNITY): Payer: Self-pay

## 2022-05-11 DIAGNOSIS — I5022 Chronic systolic (congestive) heart failure: Secondary | ICD-10-CM | POA: Diagnosis not present

## 2022-05-11 DIAGNOSIS — Z95810 Presence of automatic (implantable) cardiac defibrillator: Secondary | ICD-10-CM

## 2022-05-13 LAB — CYTOLOGY - PAP
Diagnosis: NEGATIVE
Diagnosis: REACTIVE

## 2022-05-13 NOTE — Progress Notes (Signed)
EPIC Encounter for ICM Monitoring  Patient Name: [name] is a 46 y.o. female Date: 05/13/2022 Primary Care Physican: Hoyt Koch, MD Primary Cardiologist: Bensimhon Electrophysiologist: Caryl Comes 11/19/2021 Weight: 214 lbs      04/08/2022 Weight: 190-194 lbs      05/13/2022 Weight: [phone] but now 193                                     Spoke with patient and heart failure questions reviewed.  Transmission results reviewed.  Pt feels like her abdomen is full and weight is up 3 lbs last night.     12/11 HF clinic OV note showed ReDS was 34% and HF index was 61 and questioning the accuracy of HF index.     Heartlogic HF Index dropping from high of 61 on 12/13 to 49 on 12/18 and thoracic impedance trending up suggesting fluid levels are improving.    Barostim implant 6/27.   Prescribed: Torsemide 20 mg Take 1 tablet  (20 mg total) by mouth daily.  Pt self adjusts Torsemide as needed. Potassium 20 mEq take 1 tablet daily Spironolactone 25 mg take 1 tablet (25 mg total) at bedtime.    Labs: 05/06/2022 Creatinine 1.30, BUN 8,   Potassium 3.2, Sodium 137, GFR 51 05/04/2022 Creatinine 1.07, BUN 9,   Potassium 3.9, Sodium 139  04/28/2022 Creatinine 0.95, BUN 10, Potassium 3.5, Sodium 138, GFR >60  01/05/2022 Creatinine 0.99, BUN 11, Potassium 3.7, Sodium 137 01/01/2022 Creatinine 0.80, BUN 6,   Potassium 4.5, Sodium 136, GFR >60 12/16/2021 Creatinine 1.01, BUN 9,   Potassium 4.1, Sodium 134, GFR >60 12/15/2021 Creatinine 0.98, BUN 5,   Potassium 4.3, Sodium 137, GFR >60  12/13/2021 Creatinine 0.89, BUN 6,   Potassium 3.9, Sodium 137, GFR >60  09/29/2021 Creatinine 0.67, BUN 7,   Potassium 3.9, Sodium 137, GFR >60 06/24/2021 Creatinine 0.83, BUN 9,   Potassium 3.6, Sodium 139 A complete set of results can be found in Results Review.   Recommendations:   Pt plans to take extra Torsemide and Potassium until weight returns to baseline.   Follow-up plan: ICM clinic phone  appointment on 05/27/2022 to recheck fluid levels.  91 day device clinic remote transmission 06/17/2022.              EP/Cardiology next office visit:   Recall 07/04/2022 with Oda Kilts, [place].  06/03/2022 with HF clinic.            Copy of ICM check sent to Dr. Caryl Comes and Dr Haroldine Laws as Juluis Rainier.  3 Month Trend    8 Day Data Trend          [name], RN 05/13/2022 7:56 AM

## 2022-05-14 ENCOUNTER — Encounter: Payer: Self-pay | Admitting: Hematology and Oncology

## 2022-05-20 ENCOUNTER — Telehealth (HOSPITAL_COMMUNITY): Payer: Self-pay | Admitting: *Deleted

## 2022-05-20 NOTE — Telephone Encounter (Signed)
Pt walked in c/o low bp, fatigue, and headache. Pt denies covid. Bp runnong 80's/50's-60's heart rate 66. Pt held her meds today. Per Amy Clegg,NP hold metoprolol today and decrease to '50mg'$  daily. Keep appt 1/10. Pt aware and agreeable. Pt will call if she does not improve.

## 2022-05-27 ENCOUNTER — Ambulatory Visit (INDEPENDENT_AMBULATORY_CARE_PROVIDER_SITE_OTHER): Payer: HMO

## 2022-05-27 DIAGNOSIS — I5022 Chronic systolic (congestive) heart failure: Secondary | ICD-10-CM

## 2022-05-27 DIAGNOSIS — Z95810 Presence of automatic (implantable) cardiac defibrillator: Secondary | ICD-10-CM

## 2022-05-29 ENCOUNTER — Telehealth: Payer: Self-pay

## 2022-05-29 NOTE — Telephone Encounter (Signed)
Remote ICM transmission received.  Attempted call to patient regarding ICM remote transmission and left detailed message per DPR.  Advised to return call for any fluid symptoms or questions. Next ICM remote transmission scheduled 06/22/2022.

## 2022-05-29 NOTE — Progress Notes (Signed)
EPIC Encounter for ICM Monitoring  Patient Name: [name] is a 47 y.o. female Date: 05/29/2022 Primary Care Physican: Hoyt Koch, MD Primary Cardiologist: Bensimhon Electrophysiologist: Caryl Comes 11/19/2021 Weight: 214 lbs      04/08/2022 Weight: 190-194 lbs      05/13/2022 Weight: [phone] but now 193                                     Attempted call to patient and unable to reach.  Left detailed message per DPR regarding transmission. Transmission reviewed.      Heartlogic HF Index decreased from high of 61 on 12/13 to 0 starting 12/23.    Barostim implant 6/27.   Prescribed: Torsemide 20 mg Take 1 tablet  (20 mg total) by mouth daily.  Pt self adjusts Torsemide as needed. Potassium 20 mEq take 1 tablet daily Spironolactone 25 mg take 1 tablet (25 mg total) at bedtime.    Labs: 05/06/2022 Creatinine 1.30, BUN 8,   Potassium 3.2, Sodium 137, GFR 51 05/04/2022 Creatinine 1.07, BUN 9,   Potassium 3.9, Sodium 139  04/28/2022 Creatinine 0.95, BUN 10, Potassium 3.5, Sodium 138, GFR >60  01/05/2022 Creatinine 0.99, BUN 11, Potassium 3.7, Sodium 137 01/01/2022 Creatinine 0.80, BUN 6,   Potassium 4.5, Sodium 136, GFR >60 12/16/2021 Creatinine 1.01, BUN 9,   Potassium 4.1, Sodium 134, GFR >60 12/15/2021 Creatinine 0.98, BUN 5,   Potassium 4.3, Sodium 137, GFR >60  12/13/2021 Creatinine 0.89, BUN 6,   Potassium 3.9, Sodium 137, GFR >60  09/29/2021 Creatinine 0.67, BUN 7,   Potassium 3.9, Sodium 137, GFR >60 06/24/2021 Creatinine 0.83, BUN 9,   Potassium 3.6, Sodium 139 A complete set of results can be found in Results Review.   Recommendations:   Left voice mail with ICM number and encouraged to call if experiencing any fluid symptoms.   Follow-up plan: ICM clinic phone appointment on 06/22/2022.  91 day device clinic remote transmission 06/17/2022.              EP/Cardiology next office visit:   Recall 07/04/2022 with Oda Kilts, [place].  06/03/2022 with HF clinic.             Copy of ICM check sent to Dr. Caryl Comes  3 month HeartLogic Heart Failure Index:    8 Day Trend:     [name], RN 05/29/2022 12:05 PM

## 2022-05-30 ENCOUNTER — Other Ambulatory Visit: Payer: Self-pay | Admitting: Internal Medicine

## 2022-06-03 ENCOUNTER — Ambulatory Visit (HOSPITAL_COMMUNITY)
Admission: RE | Admit: 2022-06-03 | Discharge: 2022-06-03 | Disposition: A | Discharge status: Home / Self Care | Payer: PPO | Source: Ambulatory Visit | Attending: Family Medicine | Admitting: Family Medicine

## 2022-06-03 ENCOUNTER — Ambulatory Visit: Payer: HMO | Admitting: Family Medicine

## 2022-06-03 ENCOUNTER — Encounter: Payer: HMO | Admitting: *Deleted

## 2022-06-03 VITALS — BP 110/70 | HR 115 | Ht 65.0 in | Wt 205.6 lb

## 2022-06-03 DIAGNOSIS — I959 Hypotension, unspecified: Secondary | ICD-10-CM | POA: Diagnosis not present

## 2022-06-03 DIAGNOSIS — Z79899 Other long term (current) drug therapy: Secondary | ICD-10-CM | POA: Diagnosis not present

## 2022-06-03 DIAGNOSIS — K589 Irritable bowel syndrome without diarrhea: Secondary | ICD-10-CM | POA: Insufficient documentation

## 2022-06-03 DIAGNOSIS — Z7984 Long term (current) use of oral hypoglycemic drugs: Secondary | ICD-10-CM | POA: Diagnosis not present

## 2022-06-03 DIAGNOSIS — R008 Other abnormalities of heart beat: Secondary | ICD-10-CM | POA: Diagnosis not present

## 2022-06-03 DIAGNOSIS — E119 Type 2 diabetes mellitus without complications: Secondary | ICD-10-CM | POA: Insufficient documentation

## 2022-06-03 DIAGNOSIS — I493 Ventricular premature depolarization: Secondary | ICD-10-CM | POA: Insufficient documentation

## 2022-06-03 DIAGNOSIS — Z9884 Bariatric surgery status: Secondary | ICD-10-CM | POA: Insufficient documentation

## 2022-06-03 DIAGNOSIS — Z95810 Presence of automatic (implantable) cardiac defibrillator: Secondary | ICD-10-CM | POA: Diagnosis not present

## 2022-06-03 DIAGNOSIS — I110 Hypertensive heart disease with heart failure: Secondary | ICD-10-CM | POA: Diagnosis not present

## 2022-06-03 DIAGNOSIS — I5042 Chronic combined systolic (congestive) and diastolic (congestive) heart failure: Secondary | ICD-10-CM | POA: Diagnosis not present

## 2022-06-03 DIAGNOSIS — Z006 Encounter for examination for normal comparison and control in clinical research program: Secondary | ICD-10-CM

## 2022-06-03 DIAGNOSIS — G4733 Obstructive sleep apnea (adult) (pediatric): Secondary | ICD-10-CM | POA: Insufficient documentation

## 2022-06-03 DIAGNOSIS — Z6834 Body mass index (BMI) 34.0-34.9, adult: Secondary | ICD-10-CM | POA: Diagnosis not present

## 2022-06-03 LAB — BASIC METABOLIC PANEL
Anion gap: 7 (ref 5–15)
BUN: 8 mg/dL (ref 6–20)
CO2: 24 mmol/L (ref 22–32)
Calcium: 8.8 mg/dL — ABNORMAL LOW (ref 8.9–10.3)
Chloride: 104 mmol/L (ref 98–111)
Creatinine, Ser: 0.87 mg/dL (ref 0.44–1.00)
GFR, Estimated: >60 mL/min (ref >60)
Glucose, Bld: 87 mg/dL (ref 70–99)
Potassium: 3.8 mmol/L (ref 3.5–5.1)
Sodium: 135 mmol/L (ref 135–145)

## 2022-06-03 LAB — BRAIN NATRIURETIC PEPTIDE: B Natriuretic Peptide: 710.3 pg/mL — ABNORMAL HIGH (ref 0.0–100.0)

## 2022-06-03 MED ORDER — MEXILETINE HCL 150 MG PO CAPS: 300.0000 mg | ORAL_CAPSULE | Freq: Two times a day (BID) | ORAL | 11 refills | Status: DC

## 2022-06-03 MED ORDER — MEXILETINE HCL 150 MG PO CAPS: 300.0000 mg | ORAL_CAPSULE | Freq: Three times a day (TID) | ORAL | 11 refills | Status: DC

## 2022-06-03 NOTE — Progress Notes (Deleted)
GYNECOLOGY  VISIT   HPI: 47 y.o.   Married  [place] American  female   [phone] with No LMP recorded. (Menstrual status: Oral contraceptives).   here for   U/S consult  GYNECOLOGIC HISTORY: No LMP recorded. (Menstrual status: Oral contraceptives). Contraception:  OCP Menopausal hormone therapy:  n/a Last mammogram:  2019 per pt Last pap smear:   05/06/22 neg, 08/01/10 neg        OB History     Gravida  2   Para  1   Term      Preterm      AB  1   Living  1      SAB      IAB  1   Ectopic      Multiple      Live Births                 Patient Active Problem List   Diagnosis Date Noted   Encounter for general adult medical examination with abnormal findings 05/04/2022   GERD (gastroesophageal reflux disease) 03/10/2022   Right wrist pain 04/11/2021   Birth control counseling 02/28/2021   Obesity 02/28/2021   Greater trochanteric bursitis of right hip 12/26/2020   Congestive heart failure, NYHA class III ([place]) 11/08/2020   Asthma 08/25/2020   Iron deficiency anemia due to chronic blood loss 08/06/2020   Greater trochanteric bursitis, left 07/01/2020   V-tach ([place]) 11/12/2019   Headache 10/26/2018   Chronic right-sided low back pain without sciatica 06/24/2018   Patellar subluxation, left, initial encounter 04/25/2018   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   Insomnia 05/07/2017   Acute on chronic systolic (congestive) heart failure ([place]) 04/29/2017   ICD (implantable cardioverter-defibrillator), single, 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   Patellofemoral syndrome of both knees 10/16/2016   NICM (nonischemic cardiomyopathy) ([place]) 08/03/2016   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   Labral tear of shoulder 04/04/2015   Chronic combined systolic and diastolic heart failure ([place]) 03/05/2014   IBS (irritable bowel syndrome)    MDD (major depressive disorder) 01/20/2013   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   Essential hypertension     Past Medical History:  Diagnosis  Date   Acute on chronic systolic (congestive) heart failure ([place]) 04/29/2017   Acute pain of right shoulder 06/16/2017   AICD (automatic cardioverter/defibrillator) present    Anginal pain ([facility])    Anxiety    Arthritis    right shoulder    Asthma    CHF (congestive heart failure) ([place])    Chronic combined systolic and diastolic heart failure ([place]) 03/05/2014   Closed low lateral malleolus fracture 10/23/2013   Cystitis 10/21/2017   Depression    Depression with anxiety 01/20/2013   Diabetes mellitus without complication ([place])    Diverticulosis    Dyspnea    comes and goes intermittently mostly with exertion    Dysrhythmia    Essential hypertension    Prev followed by [name]    Fibroid    age 19   Gallstones    Generalized abdominal cramping    History of cardiomyopathy    Hypertension    IBS (irritable bowel syndrome)    ICD (implantable cardioverter-defibrillator), single, 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 CPAP intolerance >> "smothering" Good tolerance of auto device (optimal pressure 12-13 on download).  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   PVC (premature ventricular contraction) 06/23/2016   Seizures ([place])    "as a child" (08/03/2016)   Termination of pregnancy    due to cardiac risk    Past Surgical History:  Procedure  Laterality Date   CARDIAC CATHETERIZATION N/A 11/11/2015   Procedure: Right/Left Heart Cath and Coronary Angiography;  Surgeon: Troy Sine, MD;  Location: [facility];  Service: Cardiovascular;  Laterality: N/A;   CARDIAC CATHETERIZATION  ~ 2015   CARDIAC DEFIBRILLATOR PLACEMENT  08/03/2016   CESAREAN SECTION  1999   COLONOSCOPY WITH PROPOFOL N/A 04/21/2016   Procedure: COLONOSCOPY WITH PROPOFOL;  Surgeon: Jerene Bears, MD;  Location: [facility];  Service: Gastroenterology;  Laterality: N/A;   ESOPHAGOGASTRODUODENOSCOPY (EGD) WITH PROPOFOL N/A 04/21/2016   Procedure: ESOPHAGOGASTRODUODENOSCOPY (EGD) WITH PROPOFOL;  Surgeon: Jerene Bears, MD;  Location: [facility];  Service: Gastroenterology;  Laterality: N/A;   FOOT FRACTURE SURGERY Right ~ 2003   FRACTURE SURGERY     ICD IMPLANT N/A 08/03/2016   Procedure: ICD Implant;  Surgeon: Deboraha Sprang, MD;  Location: [facility];  Service: Cardiovascular;  Laterality: N/A;   LAPAROSCOPIC CHOLECYSTECTOMY  12/2006   LAPAROSCOPIC GASTRIC SLEEVE RESECTION     LAPAROSCOPY ABDOMEN DIAGNOSTIC  2008   "cut bile duct w/gallbladder OR; had to go in later & fix leak; hospitalized for 2 months"   LEFT HEART CATHETERIZATION WITH CORONARY ANGIOGRAM N/A 02/26/2014   Procedure: LEFT HEART CATHETERIZATION WITH CORONARY ANGIOGRAM;  Surgeon: Jettie Booze, MD;  Location: [facility];  Service: Cardiovascular;  Laterality: N/A;   OVARIAN CYST REMOVAL Right 1999   PVC ABLATION N/A 12/01/2017   Procedure: PVC ABLATION;  Surgeon: Evans Lance, MD;  Location: [facility];  Service: Cardiovascular;  Laterality: N/A;   RIGHT HEART CATH N/A 08/05/2017   Procedure: RIGHT HEART CATH;  Surgeon: Jolaine Artist, MD;  Location: [facility];  Service: Cardiovascular;  Laterality: N/A;   RIGHT HEART CATH N/A 11/16/2019   Procedure: RIGHT HEART CATH;  Surgeon: Jolaine Artist, MD;  Location: [facility];  Service:  Cardiovascular;  Laterality: N/A;   TUBAL LIGATION  1999    Current Outpatient Medications  Medication Sig Dispense Refill   acetaminophen (TYLENOL) 650 MG CR tablet Take 650 mg by mouth every 8 (eight) hours as needed for pain (headache).     albuterol (PROAIR HFA) 108 (90 Base) MCG/ACT inhaler Inhale 1-2 puffs into the lungs every 6 (six) hours as needed for wheezing or shortness of breath. 18 g 0   amiodarone (PACERONE) 200 MG tablet Take 1 tablet (200 mg total) by mouth daily. 90 tablet 3   budesonide-formoterol (SYMBICORT) 160-4.5 MCG/ACT inhaler Inhale 2 puffs into the lungs 2 (two) times daily as needed. 10 g 3   CORLANOR 7.5 MG TABS tablet TAKE 1 TABLET BY MOUTH TWICE DAILY WITH A MEAL 60 tablet 11   dicyclomine (BENTYL) 20  MG tablet Take 1 tablet (20 mg total) by mouth daily as needed for spasms. 90 tablet 3   empagliflozin (JARDIANCE) 25 MG TABS tablet Take 1 tablet (25 mg total) by mouth daily before breakfast. 90 tablet 3   fluticasone (FLONASE) 50 MCG/ACT nasal spray Place 2 sprays into both nostrils daily as needed for allergies. 16 g 3   gabapentin (NEURONTIN) 300 MG capsule Take 1 capsule (300 mg total) by mouth at bedtime as needed (back spasms). 90 capsule 3   lidocaine (LIDODERM) 5 % Place 1 patch onto the skin daily. Remove & Discard patch within 12 hours or as directed by MD 90 patch 3   Magnesium Oxide 200 MG TABS Take 1 tablet (200 mg total) by mouth daily. 90 tablet 3   metoprolol succinate (TOPROL-XL) 50 MG 24 hr tablet Take 1.5 tablets (75 mg total) by mouth daily. Take with or immediately following a meal. 135 tablet 3   mexiletine (MEXITIL) 200 MG capsule Take 1 capsule (200 mg total) by mouth 2 (two) times daily. 60 capsule 4   Multiple Vitamins-Minerals (MULTIVITAMIN GUMMIES WOMENS PO) Take 2 tablets by mouth daily.     nitroGLYCERIN (NITROSTAT) 0.4 MG SL tablet Place 1 tablet (0.4 mg total) under the tongue every 5 (five) minutes as needed for chest pain. 75 tablet  0   norethindrone (ORTHO MICRONOR) 0.35 MG tablet Take 1 tablet (0.35 mg total) by mouth daily. 84 tablet 0   potassium chloride SA (KLOR-CON M) 20 MEQ tablet Take 1 tablet (20 mEq total) by mouth daily. 90 tablet 3   sacubitril-valsartan (ENTRESTO) 97-103 MG Take 1 tablet by mouth 2 (two) times daily. 60 tablet 3   spironolactone (ALDACTONE) 25 MG tablet Take 1 tablet (25 mg total) by mouth at bedtime. 90 tablet 3   tiZANidine (ZANAFLEX) 2 MG tablet TAKE 1 TABLET BY MOUTH AT BEDTIME 30 tablet 0   torsemide (DEMADEX) 20 MG tablet Take 20 mg by mouth daily. Take an extra tablet ( 20 mg ), Monday 05/04/22, Tuesday 05/05/22, and Wednesday 05/06/22 only.  Then, take as ordered, 1 tablet ( 20 mg ), by mouth daily.     Ubrogepant (UBRELVY) 100 MG TABS Take 100 mg by mouth daily as needed (migraine). 30 tablet 3   Vitamin D, Ergocalciferol, (DRISDOL) 1.25 MG (50000 UNIT) CAPS capsule TAKE 1 CAPSULE BY MOUTH EVERY 7 DAYS 12 capsule 0   No current facility-administered medications for this visit.     ALLERGIES: Vancomycin, Aspirin, Contrast media [iodinated contrast media], Ciprofloxacin, Cyclobenzaprine, Farxiga [dapagliflozin], and Sulfa antibiotics  Family History  Problem Relation Age of Onset   Emphysema Maternal Grandmother        smoked   Heart disease Maternal Grandmother 57       MI   Rheum arthritis Mother    Allergies Daughter    Colon cancer Neg Hx     Social History   Socioeconomic History   Marital status: Married    Spouse name: Not on file   Number of children: 1   Years of education: Not on file   Highest education level: Not on file  Occupational History   Occupation: stay at home mom  Tobacco Use   Smoking status: Never   Smokeless tobacco: Never  Vaping Use   Vaping Use: Never used  Substance and Sexual Activity   Alcohol use: No   Drug use: No   Sexual activity: Yes    Birth control/protection: Surgical, Pill  Comment: BTL  Other Topics Concern   Not on  file  Social History Narrative   Not on file   Social Determinants of Health   Financial Resource Strain: Not on file  Food Insecurity: No Food Insecurity (08/23/2019)   Hunger Vital Sign    Worried About Running Out of Food in the Last Year: Never true    Ran Out of Food in the Last Year: Never true  Transportation Needs: No Transportation Needs (08/23/2019)   PRAPARE - Hydrologist (Medical): No    Lack of Transportation (Non-Medical): No  Physical Activity: Insufficiently Active (11/28/2019)   Exercise Vital Sign    Days of Exercise per Week: 2 days    Minutes of Exercise per Session: 40 min  Stress: Not on file  Social Connections: Socially Isolated (11/28/2019)   Social Connection and Isolation Panel [NHANES]    Frequency of Communication with Friends and Family: Twice a week    Frequency of Social Gatherings with Friends and Family: Never    Attends Religious Services: Never    Marine scientist or Organizations: No    Attends Archivist Meetings: Never    Marital Status: Married  Human resources officer Violence: Not on file    Review of Systems  PHYSICAL EXAMINATION:    There were no vitals taken for this visit.    General appearance: alert, cooperative and appears stated age Head: Normocephalic, without obvious abnormality, atraumatic Neck: no adenopathy, supple, symmetrical, trachea midline and thyroid normal to inspection and palpation Lungs: clear to auscultation bilaterally Breasts: normal appearance, no masses or tenderness, No nipple retraction or dimpling, No nipple discharge or bleeding, No axillary or supraclavicular adenopathy Heart: regular rate and rhythm Abdomen: soft, non-tender, no masses,  no organomegaly Extremities: extremities normal, atraumatic, no cyanosis or edema Skin: Skin color, texture, turgor normal. No rashes or lesions Lymph nodes: Cervical, supraclavicular, and axillary nodes normal. No abnormal  inguinal nodes palpated Neurologic: Grossly normal  Pelvic: External genitalia:  no lesions              Urethra:  normal appearing urethra with no masses, tenderness or lesions              Bartholins and Skenes: normal                 Vagina: normal appearing vagina with normal color and discharge, no lesions              Cervix: no lesions                Bimanual Exam:  Uterus:  normal size, contour, position, consistency, mobility, non-tender              Adnexa: no mass, fullness, tenderness              Rectal exam: {yes no:[id number]}.  Confirms.              Anus:  normal sphincter tone, no lesions  Chaperone was present for exam:  ***  ASSESSMENT     PLAN     An After Visit Summary was printed and given to the patient.  ______ minutes face to face time of which over 50% was spent in counseling.

## 2022-06-03 NOTE — Progress Notes (Signed)
Advanced Heart Failure Clinic Note   PCP: Hoyt Koch, MD Primary Cardiologist: Sinclair Grooms, MD  HF Cardiologist: Dr. 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   Admitted 7/19 with CP Symptoms thought to be related to frequent PVCs. EP consulted and scheduled her for PVC ablation. Echo repeated 11/22/17 and showed improved EF 50-55%.   S/p  PVC ablation 12/01/17. Was felt not to be successful.  Was on flecainide but discontinued due to reduced EF. Placed on amio   Echo 6/20 showed drop in EF back down to 25-30%. Was instructed to f/u in clinic but pt did not. She had outpatient monitor 04/2018 that showed rare PVCs.    Admitted 6/21 with chest pain and shortness of breath. Viral panel negative. ECHO EF < 20% and normal RV. Had cath with normal cors and preserved cardiac output. Plan was to f/u with EP regarding PVCs. She has remained on AAD therapy w/ amiodarone + ? blocker therapy.  Admitted to [facility] 4/22 w/ increased dyspnea and volume overload. Also w/ increased PVC burden. Echo showed EF 20-25%, RV normal. She was diuresed w/ IV Lasix and placed on amiodarone gtt for PVC suppression. Seen by Dr. Lovena Le, due to the location of her PVCs, she is not a candidate for re-do ablation.   S/p Barostim 6/22.  Zio 8/22: 13% PVCs (unifocal)  Seen by EP 12/22 and Barostim titrated. Not felt to have any other options for PVCs.   Echo 06/24/21 EF 25-30% RV normal   Admitted 12/13/21 with COVID.  Treated with 5-day course of Lagevrio and discharged home.   Seen in [facility] ED 04/28/22 with volume overload. Given total  of 120 mg IV lasix and discharged with instructions to take 5 mg metolazone X 2 days.  Follow up 12/23, volume OK on exam but HL score elevated. Zio 14 day placed to quantify PVC burden on mexiletine.   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   Today she returns for HF follow up. Overall feeling fair. Had episodes of low BP and dizziness, BP in 70-80's. Improved to 90s-100s after decreasing Toprol to 50. Continues with headaches and fatigue. She has SOB with stairs but does OK walking on flat ground. Feels pre-syncopal, but no overt syncope. Feels palpitations. Feels swelling in abdomen. Denies abnormal bleeding, CP, or PND/Orthopnea. Appetite ok. No fever or chills. Weight at home 194 pounds. Taking all medications.   Cardiac Studies  - Echo (7/23): EF 25-30%  - Echo (1/23): EF 25-30% RV normal   - Echo (4/22): EF 20-25%, RV normal   - PFTs w/ DLOC (4/22) Mild Restriction Normal Diffusion  - CPX (7/21) FVC 2.32 (70%)      FEV1 1.99 (74%)        FEV1/FVC 86 (105%)        MVV 110 (105%) Resting HR: 94 Peak HR: 175   (100% age predicted max HR) BP rest: 126/82 BP peak: 202/78 Peak VO2: 17.6 (86% predicted peak VO2) - corrects to 28.4 for ibw VE/VCO2 slope:  37 OUES: 1.74 Peak  RER: 1.08 Ventilatory Threshold: 14.1 (69% predicted or measured peak VO2) VE/MVV:  53% PETCO2 at peak:   O2pulse:  10   (83% predicted O2pulse)  - RHC (6/21):   RA = 4 RV = 32/6 PA = 31/8 (21) PCW = 12 Fick cardiac output/index = 7.0/3.4 Thermo CO/CI = 5.2/2.5 PVR =1.7 (Thermo) Ao sat = 98% PA sat = 67%, 69%  - Echo (6/21): EF < 20% RV normal   - Echo (6/20): EF 25-30%  - Echo (7/19): EF 50-55%  - CPX (12/18): pVO2 14.0 (corrects to 23.0 for IBW) VeVCO2  32 RER 1.0  - Echo (8/18): EF 25%   - CPX (2/18): FVC 2.57 (79%)      FEV1 2.14 (81%)        FEV1/FVC 83 (101%)        MVV 107 (102%) Resting HR: 106 Peak HR: 166   (93% age predicted  max HR) BP rest: 136/98 BP peak: 174/88 Peak VO2: 17.1 (85% predicted peak VO2) - corrects to 28.4 for ibw VE/VCO2 slope:  33 OUES: 2.16 Peak RER: 1.09 Ventilatory Threshold: 14.6 (73% predicted or measured peak VO2) VE/MVV:  59% PETCO2 at peak:  33 O2pulse:  10   (83% predicted O2pulse)  - Echo (12/17) 20-25%  - Cath (6/17): revealed normal vessels, moderate elevation in pulmonary artery pressures, and LVEF 25-30%.  - Echo (6/17): 25-30%  - Echo (6/15): EF 35-40%   Review of systems complete and found to be negative unless listed in HPI.    Past Medical History:  Diagnosis Date   Acute on chronic systolic (congestive) heart failure (HCC) 04/29/2017   Acute pain of right shoulder 06/16/2017   AICD (automatic cardioverter/defibrillator) present    Anginal pain (HCC)    Anxiety    Arthritis    right shoulder    Asthma    CHF (congestive heart failure) ([place])    Chronic combined systolic and diastolic heart failure ([place]) 03/05/2014   Closed low lateral malleolus fracture 10/23/2013   Cystitis 10/21/2017   Depression    Depression with anxiety 01/20/2013   Diabetes mellitus without complication (HCC)    Diverticulosis    Dyspnea    comes and goes intermittently mostly with exertion    Dysrhythmia    Essential hypertension    Prev followed by [name]    Fibroid    age 1   Gallstones    Generalized abdominal cramping    History of cardiomyopathy    Hypertension    IBS (irritable bowel syndrome)    ICD (implantable cardioverter-defibrillator), single, 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 CPAP intolerance >> "smothering" Good tolerance of auto device (optimal pressure 12-13 on download).  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     albuterol (PROAIR HFA) 108 (90 Base) MCG/ACT inhaler Inhale 1-2 puffs into the lungs every 6 (six) hours as needed for wheezing or shortness of breath. 18 g 0   amiodarone (PACERONE) 200 MG tablet Take 1 tablet (200 mg total) by mouth daily. 90 tablet 3   budesonide-formoterol (SYMBICORT) 160-4.5 MCG/ACT inhaler Inhale 2 puffs into the lungs 2 (two) times daily as needed. 10 g 3   CORLANOR 7.5 MG TABS tablet TAKE 1 TABLET BY MOUTH TWICE DAILY WITH A MEAL 60 tablet 11   dicyclomine (BENTYL) 20 MG tablet Take 1 tablet (20 mg total) by mouth daily as needed for spasms. 90 tablet 3   empagliflozin (JARDIANCE) 25 MG TABS tablet Take 1 tablet (25 mg total) by mouth daily before breakfast. 90 tablet 3   fluticasone (FLONASE) 50 MCG/ACT nasal spray Place 2 sprays into both nostrils daily as needed for allergies. 16 g 3   gabapentin (NEURONTIN) 300 MG capsule Take 1 capsule (300 mg total) by mouth at bedtime as needed (back spasms). 90 capsule 3   lidocaine (LIDODERM) 5 % Place 1 patch onto the skin daily. Remove & Discard patch within 12 hours or as directed by MD 90 patch 3   Magnesium Oxide 200 MG TABS Take 1 tablet (200 mg total) by mouth daily. 90 tablet 3   metoprolol  succinate (TOPROL-XL) 50 MG 24 hr tablet Take 1.5 tablets (75 mg total) by mouth daily. Take with or immediately following a meal. 135 tablet 3   mexiletine (MEXITIL) 200 MG capsule Take 1 capsule (200 mg total) by mouth 2 (two) times daily. 60 capsule 4   Multiple Vitamins-Minerals (MULTIVITAMIN GUMMIES WOMENS PO) Take 2 tablets by mouth daily.     nitroGLYCERIN (NITROSTAT) 0.4 MG SL tablet Place 1 tablet (0.4 mg total) under the tongue every 5 (five) minutes as needed for chest pain. 75 tablet 0   norethindrone (ORTHO MICRONOR) 0.35 MG tablet Take 1 tablet (0.35 mg total) by mouth daily. 84 tablet 0   potassium chloride SA (KLOR-CON M) 20 MEQ tablet Take 1 tablet (20 mEq total) by mouth daily. 90 tablet 3   sacubitril-valsartan (ENTRESTO) 97-103 MG Take 1 tablet by mouth 2 (two) times daily. 60 tablet 3   spironolactone (ALDACTONE) 25 MG tablet Take 1 tablet (25 mg total) by mouth at bedtime. 90 tablet 3   tiZANidine (ZANAFLEX) 2 MG tablet TAKE 1 TABLET BY MOUTH AT BEDTIME 30 tablet 0   torsemide (DEMADEX) 20 MG tablet Take 20 mg by mouth daily. Take an extra tablet ( 20 mg ), Monday 05/04/22, Tuesday 05/05/22, and Wednesday 05/06/22 only.  Then, take as ordered, 1 tablet ( 20 mg ), by mouth daily.     Ubrogepant (UBRELVY) 100 MG TABS Take 100 mg by mouth daily as needed (migraine). 30 tablet 3   Vitamin D, Ergocalciferol, (DRISDOL) 1.25 MG (50000 UNIT) CAPS capsule TAKE 1 CAPSULE BY MOUTH EVERY 7 DAYS 12 capsule 0   No current facility-administered medications for this encounter.   Allergies  Allergen Reactions   Vancomycin Other (See Comments)    "did something to my kidneys," PROGRESSED TO KIDNEY FAILURE!!   Aspirin Other (See Comments)  Wheezing, (Pt states that she just wheezes some when she takes aspirin by itself but she can take aspirin in a combination product).   Contrast Media [Iodinated Contrast Media] Other (See Comments)    Multiple CT contrast studies done over 2 weeks caused  ARF   Ciprofloxacin Itching and Rash   Cyclobenzaprine Other (See Comments)    Can not tolerate   Farxiga [Dapagliflozin] Rash   Sulfa Antibiotics Itching and Rash   Social History   Socioeconomic History   Marital status: Married    Spouse name: Not on file   Number of children: 1   Years of education: Not on file   Highest education level: Not on file  Occupational History   Occupation: stay at home mom  Tobacco Use   Smoking status: Never   Smokeless tobacco: Never  Vaping Use   Vaping Use: Never used  Substance and Sexual Activity   Alcohol use: No   Drug use: No   Sexual activity: Yes    Birth control/protection: Surgical, Pill    Comment: BTL  Other Topics Concern   Not on file  Social History Narrative   Not on file   Social Determinants of Health   Financial Resource Strain: Not on file  Food Insecurity: No Food Insecurity (08/23/2019)   Hunger Vital Sign    Worried About Running Out of Food in the Last Year: Never true    Ran Out of Food in the Last Year: Never true  Transportation Needs: No Transportation Needs (08/23/2019)   PRAPARE - Hydrologist (Medical): No    Lack of Transportation (Non-Medical): No  Physical Activity: Insufficiently Active (11/28/2019)   Exercise Vital Sign    Days of Exercise per Week: 2 days    Minutes of Exercise per Session: 40 min  Stress: Not on file  Social Connections: Socially Isolated (11/28/2019)   Social Connection and Isolation Panel [NHANES]    Frequency of Communication with Friends and Family: Twice a week    Frequency of Social Gatherings with Friends and Family: Never    Attends Religious Services: Never    Marine scientist or Organizations: No    Attends Music therapist: Never    Marital Status: Married  Human resources officer Violence: Not on file   Family History  Problem Relation Age of Onset   Emphysema Maternal Grandmother        smoked   Heart disease Maternal  Grandmother 21       MI   Rheum arthritis Mother    Allergies Daughter    Colon cancer Neg Hx    BP 110/70   Pulse (!) 115   Ht '5\' 5"'$  (1.651 m)   Wt 93.3 kg (205 lb 9.6 oz)   LMP 04/24/2022 Comment: .  SpO2 100%   BMI 34.21 kg/m   Wt Readings from Last 3 Encounters:  06/03/22 93.3 kg (205 lb 9.6 oz)  05/06/22 88 kg (194 lb)  05/06/22 88.5 kg (195 lb)   Physical Exam General:  NAD. No resp difficulty, walked into clinic HEENT: Normal Neck: Supple. No JVD. Carotids 2+ bilat; no bruits. No lymphadenopathy or thryomegaly appreciated. Cor: PMI nondisplaced. Regular rate & rhythm. No rubs, gallops or murmurs. Lungs: Clear Abdomen: Soft, nontender, nondistended. No hepatosplenomegaly. No bruits or masses. Good bowel sounds. Extremities: No cyanosis, clubbing, rash, edema Neuro: Alert & oriented x 3, cranial nerves grossly intact. Moves all 4 extremities w/o difficulty. Affect  pleasant.  Device interrogation: HL score 0 on 05/30/22  ECG (personally reviewed): artifact from barostim, appears SR with PVCs 91 bpm  ASSESSMENT & PLAN: 1. Chronic systolic HF:   - due to peri-partum CM, onset 1999.  - S/P [employer] ICD 07/2016 - LHC 6/17 No CAD - Echo 12/17 EF 20-25% - Echo 8/18 EF 25-30% s/p ICD 3/18.  - RHC 3/19: RA 2, PA 28/13, Fick CO 5.7, CI 2.8 - CPX 12/18 showed moderate limitation due mostly to obesity with some HF component - Echo 7/19 EF 50-55%, Grade 1 DD, Mild MR, Mod LAE - Echo 12/19 EF 20-25%. RV ok  - Echo 6/21 EF < 20%. - Echo 4/22 EF 20-25%, RV normal  - Has Barostim 6/22   - Echo 1/23 25-30%  - Echo 7/23 EF 25-30% - Recent Zio results worrisome for LMNA CM. - [facility]eds cardiac [facility] to look for sarcoid. Arrange genetic testing. - NYHA III. Volume looks okay on exam. Recent HL score 0. - Continue torsemide 20 mg daily + 20 KCL daily. - Continue Jardiance 25 mg daily. - Continue Entesto 97-103 mg bid. - Continue spiro 25 mg qhs. - Continue Toprol XL 50  mg daily (low BP on higher doses).    - Continue Corlanor 7.5 mg bid. - Labs today.  2. Frequent PVCs/ Bigeminy - Holter Monitor 6/19 with 30% PVCs with one primary morphology.  - S/p PVC ablation 11/2017 with Dr Lovena Le unsuccessful - Off flecainide with EF down and persistent PVCs.  - Zio Patch 04/2019 > 17% PVCs and >8% couplets.  - Zio Patch 11/28/2019 8.5 % PVCs - Zio 9/22 13% PVCs (unifocal)  - Per Dr. Lovena Le, due to the location of her PVCs, she is not a candidate for re-do ablation. Could consider referral to tertiary center, but success rate is not any more likely.  - Consider referral to Dr. Lenard Galloway to discuss ablation ?.  - Zio 14 day (12/23) showed frequent NSVT, 13% PVC burden (see discussion above) - Continue amiodarone 200 mg daily. - Increase mexiletine to 300 mg bid. - Refer to [facility] for cardiac PET to rule out sarcoid.  3. Morbid obesity - s/p gastric Sleeve at [facility] 09/20/17  - Body mass index is 34.21 kg/m.   4. Daytime Fatigue - Sleep study in 2019 and again in 2021 were both negative for sleep apnea   5. DM 2 - On Jardiance - Per PCP  Follow up in 6-8 weeks.  Rafael Bihari, [place] 06/03/22  Patient seen and examined with the above-signed Advanced Practice Provider and/or Housestaff. I personally reviewed laboratory data, imaging studies and relevant notes. I independently examined the patient and formulated the important aspects of the plan. I have edited the note to reflect any of my changes or salient points. I have personally discussed the plan with the patient and/or family.  Has persistent NYHA III symptoms. Volume status recently improved. PVC burden remains high despite amio and mexilitene 200 bid + previous ablation. On good GDMT  General:  Well appearing. No resp difficulty HEENT: normal Neck: supple. no JVD. Carotids 2+ bilat; no bruits. No lymphadenopathy or thryomegaly appreciated. Cor: PMI nondisplaced. Regular rate & rhythm. No rubs,  gallops or murmurs. Lungs: clear Abdomen: obese soft, nontender, nondistended. No hepatosplenomegaly. No bruits or masses. Good bowel sounds. Extremities: no cyanosis, clubbing, rash, edema Neuro: alert & orientedx3, cranial nerves grossly intact. moves all 4 extremities w/o difficulty. Affect pleasant  Unclear if PVCs  are a result of her CM or contributing to it (or both). No significant FHx to suggest LMNA cardiomyopathy. No other evidence of sarcoid. Will increase mexiltene to 300 bid. Refer for genetic testing and cardiac PET. Consider repeat CPX testing in the next 6-12 months.   Total time spent 35 minutes. Over half that time spent discussing above.    Glori Bickers, MD  5:57 PM

## 2022-06-03 NOTE — Patient Instructions (Addendum)
Thank you for coming in today   Labs were done today, if any labs are abnormal the clinic will call you No news is good news   EKG today  INCREASE Mexiletine to 300 mg 2 capsule   Your physician recommends that you schedule a follow-up appointment in:  6-8 weeks with Dr. Haroldine Laws    Your provider has recommended you have a Cardiac PET Scan at [facility]. We will get this approved with your insurance company and get it scheduled for you. We will call you with the date and time and instructions. [facility] will call you to review this information the day before the test.    Do the following things EVERYDAY: Weigh yourself in the morning before breakfast. Write it down and keep it in a log. Take your medicines as prescribed Eat low salt foods--Limit salt (sodium) to 2000 mg per day.  Stay as active as you can everyday Limit all fluids for the day to less than 2 liters  At the [facility], you and your health needs are our priority. As part of our continuing mission to provide you with exceptional heart care, we have created designated Provider Care Teams. These Care Teams include your primary Cardiologist (physician) and Advanced Practice Providers (APPs- Physician Assistants and Nurse Practitioners) who all work together to provide you with the care you need, when you need it.   You may see any of the following providers on your designated Care Team at your next follow up: Dr Glori Bickers Dr Loralie Champagne Dr. Roxana Hires, NP Lyda Jester, [place] [facility] [place], [place] Forestine Na, NP Audry Riles, PharmD   Please be sure to bring in all your medications bottles to every appointment.   If you have any questions or concerns before your next appointment please send [place] a message through [place] or call our office at [phone].    TO LEAVE A MESSAGE FOR THE NURSE SELECT OPTION 2, PLEASE LEAVE A MESSAGE INCLUDING: YOUR NAME DATE OF  BIRTH CALL BACK NUMBER REASON FOR CALL**this is important as we prioritize the call backs  YOU WILL RECEIVE A CALL BACK THE SAME DAY AS LONG AS YOU CALL BEFORE 4:00 PM

## 2022-06-03 NOTE — Research (Signed)
ANALOG  Informed Consent   Subject Name: [name]  Subject met inclusion and exclusion criteria.  The informed consent form, study requirements and expectations were reviewed with the subject and questions and concerns were addressed prior to the signing of the consent form.  The subject verbalized understanding of the trial requirements.  The subject agreed to participate in the ANALOG  trial and signed the informed consent at 16:38 P.M. on 06-03-2021.  The informed consent was obtained prior to performance of any protocol-specific procedures for the subject.  A copy of the signed informed consent was given to the subject and a copy was placed in the subject's medical record.   [place] Anastazia [place], Research Coordinator 06/03/2021

## 2022-06-07 ENCOUNTER — Other Ambulatory Visit: Payer: Self-pay | Admitting: Internal Medicine

## 2022-06-08 ENCOUNTER — Telehealth (HOSPITAL_COMMUNITY): Payer: Self-pay | Admitting: *Deleted

## 2022-06-08 MED ORDER — TORSEMIDE 20 MG PO TABS: 20.0000 mg | ORAL_TABLET | Freq: Every day | ORAL | Status: DC

## 2022-06-08 NOTE — Telephone Encounter (Signed)
-----  Message from Rafael Bihari, [place] sent at 06/05/2022 11:01 AM EST ----- BNP elevated, watch for fluid. Otherwise no changes.   OK to take extra 20 mg torsemide PRN

## 2022-06-08 NOTE — Telephone Encounter (Signed)
[name], RN 06/08/2022  4:44 PM EST Back to Top    Mychart message sent   [place], [facility] 0/93/8182 10:54 AM EST     No answer, Left message to return call.   Kerry Dory, [facility] 06/05/2022 12:19 PM EST     Patient called.  Left message for patient to call back.Phone: [phone] Jerilynn Mages)

## 2022-06-10 ENCOUNTER — Telehealth (HOSPITAL_COMMUNITY): Payer: Self-pay | Admitting: *Deleted

## 2022-06-10 NOTE — Telephone Encounter (Signed)
[facility] pet scan auth request faxed to [facility] [employer]

## 2022-06-16 ENCOUNTER — Encounter: Payer: Self-pay | Admitting: Obstetrics and Gynecology

## 2022-06-16 ENCOUNTER — Other Ambulatory Visit: Payer: PPO

## 2022-06-16 ENCOUNTER — Other Ambulatory Visit: Payer: PPO | Admitting: Obstetrics and Gynecology

## 2022-06-17 ENCOUNTER — Encounter: Payer: Self-pay | Admitting: *Deleted

## 2022-06-17 ENCOUNTER — Ambulatory Visit: Payer: HMO | Attending: Internal Medicine

## 2022-06-17 DIAGNOSIS — I428 Other cardiomyopathies: Secondary | ICD-10-CM | POA: Diagnosis not present

## 2022-06-17 DIAGNOSIS — Z006 Encounter for examination for normal comparison and control in clinical research program: Secondary | ICD-10-CM

## 2022-06-17 LAB — CUP PACEART REMOTE DEVICE CHECK
Battery Remaining Longevity: 114 mo
Battery Remaining Percentage: 80 %
Brady Statistic RV Percent Paced: 0 %
Date Time Interrogation Session: 20240124050500
HighPow Impedance: 69 Ohm
Implantable Lead Connection Status: 753985
Implantable Lead Implant Date: 20180312
Implantable Lead Location: 753860
Implantable Lead Model: 293
Implantable Lead Serial Number: 422842
Implantable Pulse Generator Implant Date: 20180312
Lead Channel Impedance Value: 540 Ohm
Lead Channel Setting Pacing Amplitude: 2.5 V
Lead Channel Setting Pacing Pulse Width: 0.4 ms
Lead Channel Setting Sensing Sensitivity: 0.5 mV
Pulse Gen Serial Number: 226601
Zone Setting Status: 755011

## 2022-06-17 NOTE — Research (Signed)
Patient is enrolled in ANALOG Study   The Analog Triage nurse called patient on 06-16-2022 and patient stated that she is having some fluid build up , coughing and 3 pound weight gain. Patient stated she wold wait to see if she needed to take an extra diuretic . Triage nurse recommended patient call cardiologist if symptoms continue to worsen.    [place] Merdith Adan, Research Coordinator 06/17/2021  14:59

## 2022-06-22 ENCOUNTER — Ambulatory Visit: Payer: PPO | Attending: Internal Medicine

## 2022-06-22 DIAGNOSIS — Z95810 Presence of automatic (implantable) cardiac defibrillator: Secondary | ICD-10-CM | POA: Diagnosis not present

## 2022-06-22 DIAGNOSIS — I5042 Chronic combined systolic (congestive) and diastolic (congestive) heart failure: Secondary | ICD-10-CM | POA: Diagnosis not present

## 2022-06-23 ENCOUNTER — Encounter: Payer: Self-pay | Admitting: Hematology and Oncology

## 2022-06-24 ENCOUNTER — Ambulatory Visit: Payer: HMO | Admitting: Family Medicine

## 2022-06-26 ENCOUNTER — Telehealth: Payer: Self-pay

## 2022-06-26 NOTE — Progress Notes (Signed)
EPIC Encounter for ICM Monitoring  Patient Name: [name] is a 47 y.o. female Date: 06/26/2022 Primary Care Physican: Hoyt Koch, MD Primary Cardiologist: Bensimhon Electrophysiologist: Caryl Comes 11/19/2021 Weight: 214 lbs      04/08/2022 Weight: 190-194 lbs      05/13/2022 Weight: [phone] but now 193                                     Attempted call to patient and unable to reach.  Left detailed message per DPR regarding transmission. Transmission reviewed.      Heartlogic HF Index 7 suggesting normal fluid levels.    Barostim implant 6/27.   Prescribed: Torsemide 20 mg Take 1 tablet  (20 mg total) by mouth daily.  Pt self adjusts Torsemide as needed. Potassium 20 mEq take 1 tablet daily Spironolactone 25 mg take 1 tablet (25 mg total) at bedtime.    Labs: 06/03/2022 Creatinine 0.87, BUN 8,   Potassium 3.8, Sodium 135, GFR >60, BNP 710.3 05/06/2022 Creatinine 1.30, BUN 8,   Potassium 3.2, Sodium 137, GFR 51,   BNP 251.3 05/04/2022 Creatinine 1.07, BUN 9,   Potassium 3.9, Sodium 139  04/28/2022 Creatinine 0.95, BUN 10, Potassium 3.5, Sodium 138, GFR >60 , BNP 740.6 01/05/2022 Creatinine 0.99, BUN 11, Potassium 3.7, Sodium 137 01/01/2022 Creatinine 0.80, BUN 6,   Potassium 4.5, Sodium 136, GFR >60 12/16/2021 Creatinine 1.01, BUN 9,   Potassium 4.1, Sodium 134, GFR >60 12/15/2021 Creatinine 0.98, BUN 5,   Potassium 4.3, Sodium 137, GFR >60  12/13/2021 Creatinine 0.89, BUN 6,   Potassium 3.9, Sodium 137, GFR >60  09/29/2021 Creatinine 0.67, BUN 7,   Potassium 3.9, Sodium 137, GFR >60 06/24/2021 Creatinine 0.83, BUN 9,   Potassium 3.6, Sodium 139 A complete set of results can be found in Results Review.   Recommendations:   Left voice mail with ICM number and encouraged to call if experiencing any fluid symptoms.   Follow-up plan: ICM clinic phone appointment on 07/27/2022.  91 day device clinic remote transmission 09/16/2022.              EP/Cardiology next office  visit:   Recall 07/04/2022 with Oda Kilts, [place].  07/31/2022 with Dr Haroldine Laws            Copy of ICM check sent to Dr. Caryl Comes.  3 Month HeartLogicT Heart Failure Index:    8 Day Data Trend:          [name], RN 06/26/2022 12:26 PM

## 2022-06-26 NOTE — Telephone Encounter (Signed)
Remote ICM transmission received.  Attempted call to patient regarding ICM remote transmission and left detailed message per DPR.  Advised to return call for any fluid symptoms or questions. Next ICM remote transmission scheduled 07/27/2022.    

## 2022-06-30 ENCOUNTER — Ambulatory Visit: Payer: PPO | Admitting: Family Medicine

## 2022-06-30 NOTE — Progress Notes (Deleted)
[name] Phone: [phone] Subjective:    I'm seeing this patient by the request  of:  Hoyt Koch, MD  CC:   [id number]  [name] is a 47 y.o. female coming in with complaint of back and neck pain. OMT on 03/02/2022. Patient states   Medications patient has been prescribed:   Taking:         Reviewed prior external information including notes and imaging from previsou exam, outside providers and external EMR if available.   As well as notes that were available from care everywhere and other healthcare systems.  Past medical history, social, surgical and family history all reviewed in electronic medical record.  No pertanent information unless stated regarding to the chief complaint.   Past Medical History:  Diagnosis Date   Acute on chronic systolic (congestive) heart failure (HCC) 04/29/2017   Acute pain of right shoulder 06/16/2017   AICD (automatic cardioverter/defibrillator) present    Anginal pain (HCC)    Anxiety    Arthritis    right shoulder    Asthma    CHF (congestive heart failure) ([place])    Chronic combined systolic and diastolic heart failure ([place]) 03/05/2014   Closed low lateral malleolus fracture 10/23/2013   Cystitis 10/21/2017   Depression    Depression with anxiety 01/20/2013   Diabetes mellitus without complication (HCC)    Diverticulosis    Dyspnea    comes and goes intermittently mostly with exertion    Dysrhythmia    Essential hypertension    Prev followed by [name]    Fibroid    age 73   Gallstones    Generalized abdominal cramping    History of cardiomyopathy    Hypertension    IBS (irritable bowel syndrome)    ICD (implantable cardioverter-defibrillator), single, 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 CPAP intolerance >> "smothering" Good tolerance of auto device (optimal pressure 12-13 on download).  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   Aspirin Other (See Comments)    Wheezing, (Pt states that she just wheezes some when she takes aspirin by itself but she can take aspirin in a combination product).   Contrast Media [Iodinated Contrast Media] Other (See Comments)    Multiple CT contrast studies done over 2 weeks caused ARF   Ciprofloxacin Itching and Rash   Cyclobenzaprine Other (See Comments)    Can not tolerate   [place] [Dapagliflozin] Rash  Sulfa Antibiotics Itching and Rash     Review of Systems:  No headache, visual changes, nausea, vomiting, diarrhea, constipation, dizziness, abdominal pain, skin rash, fevers, chills, night sweats, weight loss, swollen lymph nodes, body aches, joint swelling, chest pain, shortness of breath, mood changes. POSITIVE muscle aches  Objective  There were no vitals taken for this visit.   General: No apparent distress alert and oriented x3 mood and affect normal, dressed appropriately.  HEENT: Pupils equal, extraocular movements intact  Respiratory: [name] speak  in full sentences and does not appear short of breath  Cardiovascular: No lower extremity edema, non tender, no erythema  Gait MSK:  Back   Osteopathic findings  C2 flexed rotated and side bent right C6 flexed rotated and side bent left T3 extended rotated and side bent right inhaled rib T9 extended rotated and side bent left L2 flexed rotated and side bent right Sacrum right on right       Assessment and Plan:  No problem-specific Assessment & Plan notes found for this encounter.    Nonallopathic problems  Decision today to treat with OMT was based on Physical Exam  After verbal consent patient was treated with HVLA, ME, FPR techniques in cervical, rib, thoracic, lumbar, and sacral  areas  Patient tolerated the procedure well with improvement in symptoms  Patient given exercises, stretches and lifestyle modifications  See medications in patient instructions if given  Patient will follow up in 4-8 weeks             Note: This dictation was prepared with Dragon dictation along with smaller phrase technology. Any transcriptional errors that result from this process are unintentional.

## 2022-07-02 ENCOUNTER — Telehealth: Payer: Self-pay

## 2022-07-02 DIAGNOSIS — I5042 Chronic combined systolic (congestive) and diastolic (congestive) heart failure: Secondary | ICD-10-CM

## 2022-07-02 NOTE — Telephone Encounter (Signed)
Returned call to Pt.  Advised of tachycardic episodes from 06/27/2022.  She states she sent the manual transmission today because she has not felt well.  Feels like heart is racing all the time.  Advised Dr. Haroldine Laws would like to get lab work.  She will come to the [facility] office tomorrow for that.  She states she got a letter from her insurance approving her for the PET scan ordered by Dr. B but has not gotten a call from [facility] as of yet to get this scheduled.  Advised would send a message to HF clinic to see about facilitating the PET scan.  Lab orders placed for tomorrow.

## 2022-07-02 NOTE — Telephone Encounter (Signed)
Alert received from [employer]:  Normal device function.   There were 4 NSVT arrhythmias detected from 06/27/2022, these were rates of 171 bpm, longest was 2 minutes and 30 seconds, no EGMs to view.  Sent to triage   EGM's found on BS website. The morphology of the V signal appears to be the same as the presenting EGM.  Left message for [name] to call back regarding symptoms, however these episodes were around 1:00 am on June 27, 2022.  Will forward to Dr. Haroldine Laws and Dr. Lovena Le for advisement.

## 2022-07-03 ENCOUNTER — Ambulatory Visit: Payer: PPO | Attending: Family Medicine

## 2022-07-03 DIAGNOSIS — I5042 Chronic combined systolic (congestive) and diastolic (congestive) heart failure: Secondary | ICD-10-CM | POA: Diagnosis not present

## 2022-07-03 LAB — BASIC METABOLIC PANEL
BUN/Creatinine Ratio: 5 — ABNORMAL LOW (ref 9–23)
BUN: 5 mg/dL — ABNORMAL LOW (ref 6–24)
CO2: 25 mmol/L (ref 20–29)
Calcium: 8.6 mg/dL — ABNORMAL LOW (ref 8.7–10.2)
Chloride: 101 mmol/L (ref 96–106)
Creatinine, Ser: 0.93 mg/dL (ref 0.57–1.00)
Glucose: 88 mg/dL (ref 70–99)
Potassium: 3.6 mmol/L (ref 3.5–5.2)
Sodium: 136 mmol/L (ref 134–144)
eGFR: 76 mL/min/{1.73_m2} (ref >59)

## 2022-07-03 LAB — MAGNESIUM: Magnesium: 1.9 mg/dL (ref 1.6–2.3)

## 2022-07-03 NOTE — Telephone Encounter (Signed)
No answer, Left message to return call.  

## 2022-07-03 NOTE — Telephone Encounter (Signed)
Per Delana Meyer cPET was denied b/c insurance doesn't allow out of network ([place]) coverage. She will contact pt to f/u

## 2022-07-06 DIAGNOSIS — I5040 Unspecified combined systolic (congestive) and diastolic (congestive) heart failure: Secondary | ICD-10-CM | POA: Diagnosis not present

## 2022-07-06 DIAGNOSIS — I110 Hypertensive heart disease with heart failure: Secondary | ICD-10-CM | POA: Diagnosis not present

## 2022-07-09 NOTE — Progress Notes (Signed)
Remote ICD transmission.   

## 2022-07-13 NOTE — Telephone Encounter (Signed)
Lorel Monaco, RMA Left message for patient to call and schedule appointment.

## 2022-07-13 NOTE — Telephone Encounter (Signed)
Message sent to appt desk to schedule.

## 2022-07-13 NOTE — Telephone Encounter (Signed)
Please contact the patient back to reschedule her pelvic ultrasound for abnormal uterine bleeding and recheck.   She cancelled her appointment for this due to illness.   I started a new form of birth control for her at her last visit.   She came up in my reminder box in Epic.

## 2022-07-14 ENCOUNTER — Encounter (HOSPITAL_COMMUNITY): Payer: Self-pay | Admitting: Internal Medicine

## 2022-07-14 NOTE — Telephone Encounter (Signed)
I sent patient a My Chart message asking her to call and r/s her u/s appt.

## 2022-07-15 NOTE — Telephone Encounter (Signed)
@  J[facility]-per pt PET scan approved If approved can we proceed with sending order to scheduling

## 2022-07-15 NOTE — Telephone Encounter (Signed)
Ultrasound appointment has been rescheduled for 08/18/22 at 4:30pm.

## 2022-07-21 ENCOUNTER — Encounter (HOSPITAL_COMMUNITY): Payer: Self-pay | Admitting: Cardiology

## 2022-07-21 ENCOUNTER — Telehealth (HOSPITAL_COMMUNITY): Payer: Self-pay | Admitting: Cardiology

## 2022-07-21 DIAGNOSIS — I5042 Chronic combined systolic (congestive) and diastolic (congestive) heart failure: Secondary | ICD-10-CM

## 2022-07-21 MED ORDER — POTASSIUM CHLORIDE CRYS ER 20 MEQ PO TBCR: 40.0000 meq | EXTENDED_RELEASE_TABLET | Freq: Every day | ORAL | 3 refills | Status: DC

## 2022-07-21 NOTE — Telephone Encounter (Signed)
Pt aware of results via mychart message

## 2022-07-21 NOTE — Telephone Encounter (Signed)
-----   Message from Jolaine Artist, MD sent at 07/06/2022  5:06 PM EST ----- Can we double kcl to 40 daily? Recheck BMET 1 week

## 2022-07-21 NOTE — Progress Notes (Deleted)
[facility] [address] [place] Phone: [phone] Subjective:    I'm seeing this patient by the request  of:  No primary care provider on file.  CC:   [id number]  [name] is a 47 y.o. female coming in with complaint of back and neck pain. OMT on 03/02/2022. Patient states   Medications patient has been prescribed:   Taking:         Reviewed prior external information including notes and imaging from previsou exam, outside providers and external EMR if available.   As well as notes that were available from care everywhere and other healthcare systems.  Past medical history, social, surgical and family history all reviewed in electronic medical record.  No pertanent information unless stated regarding to the chief complaint.   Past Medical History:  Diagnosis Date   Acute on chronic systolic (congestive) heart failure (HCC) 04/29/2017   Acute pain of right shoulder 06/16/2017   AICD (automatic cardioverter/defibrillator) present    Anginal pain (HCC)    Anxiety    Arthritis    right shoulder    Asthma    CHF (congestive heart failure) ([place])    Chronic combined systolic and diastolic heart failure ([place]) 03/05/2014   Closed low lateral malleolus fracture 10/23/2013   Cystitis 10/21/2017   Depression    Depression with anxiety 01/20/2013   Diabetes mellitus without complication (HCC)    Diverticulosis    Dyspnea    comes and goes intermittently mostly with exertion    Dysrhythmia    Essential hypertension    Prev followed by [name]    Fibroid    age 65   Gallstones    Generalized abdominal cramping    History of cardiomyopathy    Hypertension    IBS (irritable bowel syndrome)    ICD (implantable cardioverter-defibrillator), single, 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 CPAP intolerance >> "smothering" Good tolerance of auto device (optimal pressure 12-13 on download).  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   Aspirin Other (See Comments)    Wheezing, (Pt states that she just wheezes some when she takes aspirin by itself but she can take aspirin in a combination product).   Contrast Media [Iodinated Contrast Media] Other (See Comments)    Multiple CT contrast studies done over 2 weeks caused ARF   Ciprofloxacin Itching and Rash   Cyclobenzaprine Other (See Comments)    Can not tolerate   [place] [Dapagliflozin]  Rash   Sulfa Antibiotics Itching and Rash     Review of Systems:  No headache, visual changes, nausea, vomiting, diarrhea, constipation, dizziness, abdominal pain, skin rash, fevers, chills, night sweats, weight loss, swollen lymph nodes, body aches, joint swelling, chest pain, shortness of breath, mood changes. POSITIVE muscle aches  Objective  There were no vitals taken for this visit.   General: No apparent distress alert and oriented x3 mood and affect normal, dressed appropriately.  HEENT: Pupils equal, extraocular movements intact  Respiratory:  [name] speak in full sentences and does not appear short of breath  Cardiovascular: No lower extremity edema, non tender, no erythema  Gait MSK:  Back   Osteopathic findings  C2 flexed rotated and side bent right C6 flexed rotated and side bent left T3 extended rotated and side bent right inhaled rib T9 extended rotated and side bent left L2 flexed rotated and side bent right Sacrum right on right       Assessment and Plan:  No problem-specific Assessment & Plan notes found for this encounter.    Nonallopathic problems  Decision today to treat with OMT was based on Physical Exam  After verbal consent patient was treated with HVLA, ME, FPR techniques in cervical, rib, thoracic, lumbar, and sacral  areas  Patient tolerated the procedure well with improvement in symptoms  Patient given exercises, stretches and lifestyle modifications  See medications in patient instructions if given  Patient will follow up in 4-8 weeks             Note: This dictation was prepared with Dragon dictation along with smaller phrase technology. Any transcriptional errors that result from this process are unintentional.

## 2022-07-22 ENCOUNTER — Ambulatory Visit: Payer: PPO | Admitting: Family Medicine

## 2022-07-23 ENCOUNTER — Other Ambulatory Visit: Payer: Self-pay | Admitting: Obstetrics and Gynecology

## 2022-07-23 NOTE — Telephone Encounter (Signed)
Medication refill request: norethindrone Last AEX:  05-06-22 BS Next OV for PUS: 08-18-22  Last MMG (if hormonal medication request): n/a Refill authorized: Today, please advise.   Medication pended for #28, 0RF. Please refill if appropriate.

## 2022-07-27 ENCOUNTER — Ambulatory Visit: Payer: PPO | Attending: Internal Medicine

## 2022-07-27 DIAGNOSIS — I5042 Chronic combined systolic (congestive) and diastolic (congestive) heart failure: Secondary | ICD-10-CM

## 2022-07-27 DIAGNOSIS — Z95810 Presence of automatic (implantable) cardiac defibrillator: Secondary | ICD-10-CM

## 2022-07-29 ENCOUNTER — Encounter (HOSPITAL_COMMUNITY): Payer: Self-pay | Admitting: Internal Medicine

## 2022-07-30 ENCOUNTER — Ambulatory Visit (HOSPITAL_COMMUNITY)
Admission: RE | Admit: 2022-07-30 | Discharge: 2022-07-30 | Disposition: A | Discharge status: Home / Self Care | Payer: PPO | Source: Ambulatory Visit | Attending: Internal Medicine | Admitting: Internal Medicine

## 2022-07-30 ENCOUNTER — Telehealth: Payer: Self-pay

## 2022-07-30 DIAGNOSIS — I5042 Chronic combined systolic (congestive) and diastolic (congestive) heart failure: Secondary | ICD-10-CM | POA: Diagnosis not present

## 2022-07-30 LAB — BASIC METABOLIC PANEL
Anion gap: 8 (ref 5–15)
BUN: 6 mg/dL (ref 6–20)
CO2: 22 mmol/L (ref 22–32)
Calcium: 8.5 mg/dL — ABNORMAL LOW (ref 8.9–10.3)
Chloride: 106 mmol/L (ref 98–111)
Creatinine, Ser: 0.85 mg/dL (ref 0.44–1.00)
GFR, Estimated: >60 mL/min (ref >60)
Glucose, Bld: 99 mg/dL (ref 70–99)
Potassium: 3.6 mmol/L (ref 3.5–5.1)
Sodium: 136 mmol/L (ref 135–145)

## 2022-07-30 NOTE — Telephone Encounter (Signed)
Remote ICM transmission received.  Attempted call to patient regarding ICM remote transmission and left detailed message per DPR.  Advised to return call for any fluid symptoms or questions. Next ICM remote transmission scheduled 08/31/2022.

## 2022-07-30 NOTE — Progress Notes (Signed)
EPIC Encounter for ICM Monitoring  Patient Name: [name] is a 47 y.o. female Date: 07/30/2022 Primary Care Physican: No primary care provider on file. Primary Cardiologist: [place] Electrophysiologist: Caryl Comes 11/19/2021 Weight: 214 lbs      04/08/2022 Weight: 190-194 lbs      05/13/2022 Weight: 193 lbs                                    Attempted call to patient and unable to reach.  Left detailed message per DPR regarding transmission. Transmission reviewed.       Heartlogic HF Index 8 suggesting normal fluid levels.    Barostim implant 6/27.   Prescribed: Torsemide 20 mg Take 1 tablet  (20 mg total) by mouth daily. Can take extra 20 mg as needed. Potassium 20 mEq take 2 tablet(s) by mouth daily Spironolactone 25 mg take 1 tablet (25 mg total) at bedtime.    Labs: 07/03/2022 Creatinine 0.93, BUN 5,   Potassium 3.6, Sodium 136, GFR 76 06/03/2022 Creatinine 0.87, BUN 8,   Potassium 3.8, Sodium 135, GFR >60, BNP 710.3 05/06/2022 Creatinine 1.30, BUN 8,   Potassium 3.2, Sodium 137, GFR 51,   BNP 251.3 05/04/2022 Creatinine 1.07, BUN 9,   Potassium 3.9, Sodium 139  A complete set of results can be found in Results Review.   Recommendations:  Left voice mail with ICM number and encouraged to call if experiencing any fluid symptoms.   Follow-up plan: ICM clinic phone appointment on 08/31/2022.  91 day device clinic remote transmission 09/16/2022.              EP/Cardiology next office visit:   Recall 07/04/2022 with Oda Kilts, [place].  07/31/2022 with Dr Haroldine Laws            Copy of ICM check sent to Dr. Caryl Comes.  3 Month HeartLogicT Heart Failure Index:    8 Day Data Trend:          [name], RN 07/30/2022 8:11 AM

## 2022-07-31 ENCOUNTER — Encounter (HOSPITAL_COMMUNITY): Payer: Self-pay | Admitting: Internal Medicine

## 2022-07-31 ENCOUNTER — Ambulatory Visit (HOSPITAL_COMMUNITY)
Admission: RE | Admit: 2022-07-31 | Discharge: 2022-07-31 | Disposition: A | Discharge status: Home / Self Care | Payer: PPO | Source: Ambulatory Visit | Attending: Internal Medicine | Admitting: Internal Medicine

## 2022-07-31 VITALS — BP 108/62 | HR 94 | Wt 210.8 lb

## 2022-07-31 DIAGNOSIS — R008 Other abnormalities of heart beat: Secondary | ICD-10-CM | POA: Insufficient documentation

## 2022-07-31 DIAGNOSIS — G4733 Obstructive sleep apnea (adult) (pediatric): Secondary | ICD-10-CM | POA: Insufficient documentation

## 2022-07-31 DIAGNOSIS — I454 Nonspecific intraventricular block: Secondary | ICD-10-CM | POA: Insufficient documentation

## 2022-07-31 DIAGNOSIS — Z9884 Bariatric surgery status: Secondary | ICD-10-CM | POA: Diagnosis not present

## 2022-07-31 DIAGNOSIS — I493 Ventricular premature depolarization: Secondary | ICD-10-CM | POA: Diagnosis not present

## 2022-07-31 DIAGNOSIS — I5022 Chronic systolic (congestive) heart failure: Secondary | ICD-10-CM | POA: Insufficient documentation

## 2022-07-31 DIAGNOSIS — I5042 Chronic combined systolic (congestive) and diastolic (congestive) heart failure: Secondary | ICD-10-CM | POA: Diagnosis not present

## 2022-07-31 DIAGNOSIS — Z7984 Long term (current) use of oral hypoglycemic drugs: Secondary | ICD-10-CM | POA: Diagnosis not present

## 2022-07-31 DIAGNOSIS — K589 Irritable bowel syndrome without diarrhea: Secondary | ICD-10-CM | POA: Insufficient documentation

## 2022-07-31 DIAGNOSIS — E119 Type 2 diabetes mellitus without complications: Secondary | ICD-10-CM | POA: Insufficient documentation

## 2022-07-31 DIAGNOSIS — Z95810 Presence of automatic (implantable) cardiac defibrillator: Secondary | ICD-10-CM | POA: Diagnosis not present

## 2022-07-31 DIAGNOSIS — Z79899 Other long term (current) drug therapy: Secondary | ICD-10-CM | POA: Diagnosis not present

## 2022-07-31 DIAGNOSIS — I110 Hypertensive heart disease with heart failure: Secondary | ICD-10-CM | POA: Insufficient documentation

## 2022-07-31 DIAGNOSIS — Z6835 Body mass index (BMI) 35.0-35.9, adult: Secondary | ICD-10-CM | POA: Diagnosis not present

## 2022-07-31 MED ORDER — METOPROLOL SUCCINATE ER 100 MG PO TB24: 100.0000 mg | ORAL_TABLET | Freq: Every day | ORAL | 3 refills | Status: DC

## 2022-07-31 NOTE — Progress Notes (Addendum)
Advanced Heart Failure Clinic Note   PCP: No primary care provider on file. Primary Cardiologist: Sinclair Grooms, MD (Inactive)  HF Cardiologist: Dr. 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   Admitted 7/19 with CP Symptoms thought to be related to frequent PVCs. EP consulted and scheduled her for PVC ablation. Echo repeated 11/22/17 and showed improved EF 50-55%.   S/p  PVC ablation 12/01/17. Was felt not to be successful.  Was on flecainide but discontinued due to reduced EF. Placed on amio   Echo 6/20 showed drop in EF back down to 25-30%. Was instructed to f/u in clinic but pt did not. She had outpatient monitor 04/2018 that showed rare PVCs.    Admitted 6/21 with chest pain and shortness of breath. Viral panel negative. ECHO EF < 20% and normal RV. Had cath with normal cors and preserved cardiac output. Plan was to f/u with EP regarding PVCs. She has remained on AAD therapy w/ amiodarone + ? blocker therapy.  Admitted to [facility] 4/22 w/ increased dyspnea and volume overload. Also w/ increased PVC burden. Echo showed EF 20-25%, RV normal. She was diuresed w/ IV Lasix and placed on amiodarone gtt for PVC suppression. Seen by Dr. Lovena Le, due to the location of her PVCs, she is not a candidate for re-do ablation.   S/p Barostim 6/22.  Zio 8/22: 13% PVCs (unifocal)  Seen by EP 12/22 and Barostim titrated. Not felt to have any other options for PVCs.   Echo 06/24/21 EF 25-30% RV normal   Admitted 12/13/21 with COVID.  Treated with 5-day course of Lagevrio and discharged home.   Seen in [facility] ED 04/28/22 with volume  overload. Given total of 120 mg IV lasix and discharged with instructions to take 5 mg metolazone X 2 days.  Follow up 12/23, volume OK on exam but HL score elevated. Zio 14 day placed to quantify PVC burden on mexiletine.   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  -> mexilitene increased to 300 bid  Today she returns for HF follow up. Feels fatigued. More than usual. Not much energy. Feels like her heart is racing more at night. No syncope or ICD firings. Mild edema in hands and belly. Compliant with meds.   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)     Cardiac Studies  - Echo (7/23): EF 25-30%  - Echo (1/23): EF 25-30% RV normal   - Echo (4/22): EF 20-25%, RV normal   - PFTs w/ DLOC (4/22) Mild Restriction Normal Diffusion  - CPX (7/21) FVC 2.32 (70%)      FEV1 1.99 (74%)        FEV1/FVC 86 (105%)        MVV 110 (105%) Resting HR: 94 Peak HR: 175   (100% age predicted max HR) BP rest: 126/82 BP peak: 202/78 Peak VO2: 17.6 (86% predicted peak VO2) - corrects to 28.4 for ibw VE/VCO2 slope:  37 OUES: 1.74 Peak RER: 1.08 Ventilatory Threshold:  14.1 (69% predicted or measured peak VO2) VE/MVV:  53% PETCO2 at peak:   O2pulse:  10   (83% predicted O2pulse)  - RHC (6/21):   RA = 4 RV = 32/6 PA = 31/8 (21) PCW = 12 Fick cardiac output/index = 7.0/3.4 Thermo CO/CI = 5.2/2.5 PVR =1.7 (Thermo) Ao sat = 98% PA sat = 67%, 69%  - Echo (6/21): EF < 20% RV normal   - Echo (6/20): EF 25-30%  - Echo (7/19): EF 50-55%  - CPX (12/18): pVO2 14.0 (corrects to 23.0 for IBW) VeVCO2  32 RER 1.0  - Echo (8/18): EF 25%   - CPX (2/18): FVC 2.57 (79%)      FEV1 2.14 (81%)        FEV1/FVC 83 (101%)        MVV 107 (102%) Resting HR: 106 Peak HR: 166   (93% age predicted max HR) BP rest: 136/98 BP peak: 174/88 Peak VO2: 17.1 (85% predicted peak  VO2) - corrects to 28.4 for ibw VE/VCO2 slope:  33 OUES: 2.16 Peak RER: 1.09 Ventilatory Threshold: 14.6 (73% predicted or measured peak VO2) VE/MVV:  59% PETCO2 at peak:  33 O2pulse:  10   (83% predicted O2pulse)  - Echo (12/17) 20-25%  - Cath (6/17): revealed normal vessels, moderate elevation in pulmonary artery pressures, and LVEF 25-30%.  - Echo (6/17): 25-30%  - Echo (6/15): EF 35-40%   Review of systems complete and found to be negative unless listed in HPI.    Past Medical History:  Diagnosis Date   Acute on chronic systolic (congestive) heart failure (HCC) 04/29/2017   Acute pain of right shoulder 06/16/2017   AICD (automatic cardioverter/defibrillator) present    Anginal pain (HCC)    Anxiety    Arthritis    right shoulder    Asthma    CHF (congestive heart failure) ([place])    Chronic combined systolic and diastolic heart failure ([place]) 03/05/2014   Closed low lateral malleolus fracture 10/23/2013   Cystitis 10/21/2017   Depression    Depression with anxiety 01/20/2013   Diabetes mellitus without complication (HCC)    Diverticulosis    Dyspnea    comes and goes intermittently mostly with exertion    Dysrhythmia    Essential hypertension    Prev followed by [name]    Fibroid    age 47   Gallstones    Generalized abdominal cramping    History of cardiomyopathy    Hypertension    IBS (irritable bowel syndrome)    ICD (implantable cardioverter-defibrillator), single, 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 CPAP intolerance >> "smothering" Good tolerance of auto device (optimal pressure 12-13 on download).  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     albuterol (VENTOLIN HFA) 108 (90 Base) MCG/ACT inhaler INHALE 1 TO 2 PUFFS BY MOUTH EVERY 6 HOURS AS NEEDED FOR WHEEZING FOR SHORTNESS OF BREATH 18 g 0   amiodarone (PACERONE) 200 MG tablet Take 1 tablet (200 mg total) by mouth daily. 90 tablet 3   budesonide-formoterol (SYMBICORT) 160-4.5 MCG/ACT inhaler Inhale 2 puffs into the lungs 2 (two) times daily as needed. 10 g 3   CORLANOR 7.5 MG TABS tablet TAKE 1 TABLET BY MOUTH TWICE DAILY WITH A MEAL 60 tablet 11   dicyclomine (BENTYL) 20 MG tablet Take 1 tablet (20 mg total) by mouth daily as needed for spasms. 90 tablet 3   empagliflozin (JARDIANCE) 25 MG TABS tablet Take 1 tablet (25 mg total) by mouth daily before breakfast. 90 tablet 3   fluticasone (FLONASE) 50 MCG/ACT nasal spray Place 2 sprays into both nostrils daily as needed for allergies. 16 g 3   gabapentin (NEURONTIN) 300 MG capsule Take 1 capsule (300 mg total) by mouth at bedtime as needed (back spasms). 90 capsule 3   lidocaine (LIDODERM) 5 % Place 1 patch onto the skin daily. Remove & Discard patch within 12 hours or as directed by MD 90 patch 3   Magnesium Oxide 200 MG TABS Take 1 tablet (200 mg total) by mouth daily. 90 tablet 3   metoprolol succinate (TOPROL-XL) 50 MG 24 hr tablet Take 1.5 tablets (75 mg total) by mouth daily. Take  with or immediately following a meal. 135 tablet 3   mexiletine (MEXITIL) 150 MG capsule Take 2 capsules (300 mg total) by mouth 2 (two) times daily. 120 capsule 11   Multiple Vitamins-Minerals (MULTIVITAMIN GUMMIES WOMENS PO) Take 2 tablets by mouth daily.     nitroGLYCERIN (NITROSTAT) 0.4 MG SL tablet Place 1 tablet (0.4 mg total) under the tongue every 5 (five) minutes as needed for chest pain. 75 tablet 0   norethindrone (MICRONOR) 0.35 MG tablet Take 1 tablet by mouth once daily 28 tablet 0   potassium chloride SA (KLOR-CON M) 20 MEQ tablet Take 2 tablets (40 mEq total) by mouth daily. 180 tablet 3   sacubitril-valsartan (ENTRESTO) 97-103 MG Take 1 tablet by mouth 2 (two) times daily. 60 tablet 3   spironolactone (ALDACTONE) 25 MG tablet Take 1 tablet (25 mg total) by mouth at bedtime. 90 tablet 3   tizanidine (ZANAFLEX) 2 MG capsule Take 2 mg by mouth as needed for muscle spasms.     torsemide (DEMADEX) 20 MG tablet Take 1 tablet (20 mg total) by mouth daily. Can take extra 20 mg as needed for swelling     Ubrogepant (UBRELVY) 100 MG TABS Take 100 mg by mouth daily as needed (migraine). 30 tablet 3   Vitamin D, Ergocalciferol, (DRISDOL) 1.25 MG (50000 UNIT) CAPS capsule TAKE 1 CAPSULE BY MOUTH EVERY 7 DAYS 12 capsule 0   No current facility-administered medications for this encounter.   Allergies  Allergen Reactions   Vancomycin Other (See Comments)    "did something to my kidneys," PROGRESSED TO KIDNEY FAILURE!!   Aspirin Other (See Comments)    Wheezing, (Pt states that she just wheezes some when she takes aspirin by itself but she can  take aspirin in a combination product).   Contrast Media [Iodinated Contrast Media] Other (See Comments)    Multiple CT contrast studies done over 2 weeks caused ARF   Ciprofloxacin Itching and Rash   Cyclobenzaprine Other (See Comments)    Can not tolerate   Farxiga [Dapagliflozin] Rash   Sulfa Antibiotics Itching and Rash   Social History    Socioeconomic History   Marital status: Married    Spouse name: Not on file   Number of children: 1   Years of education: Not on file   Highest education level: Not on file  Occupational History   Occupation: stay at home mom  Tobacco Use   Smoking status: Never   Smokeless tobacco: Never  Vaping Use   Vaping Use: Never used  Substance and Sexual Activity   Alcohol use: No   Drug use: No   Sexual activity: Yes    Birth control/protection: Surgical, Pill    Comment: BTL  Other Topics Concern   Not on file  Social History Narrative   Not on file   Social Determinants of Health   Financial Resource Strain: Not on file  Food Insecurity: No Food Insecurity (08/23/2019)   Hunger Vital Sign    Worried About Running Out of Food in the Last Year: Never true    Ran Out of Food in the Last Year: Never true  Transportation Needs: No Transportation Needs (08/23/2019)   PRAPARE - Hydrologist (Medical): No    Lack of Transportation (Non-Medical): No  Physical Activity: Insufficiently Active (11/28/2019)   Exercise Vital Sign    Days of Exercise per Week: 2 days    Minutes of Exercise per Session: 40 min  Stress: Not on file  Social Connections: Socially Isolated (11/28/2019)   Social Connection and Isolation Panel [NHANES]    Frequency of Communication with Friends and Family: Twice a week    Frequency of Social Gatherings with Friends and Family: Never    Attends Religious Services: Never    Marine scientist or Organizations: No    Attends Music therapist: Never    Marital Status: Married  Human resources officer Violence: Not on file   Family History  Problem Relation Age of Onset   Emphysema Maternal Grandmother        smoked   Heart disease Maternal Grandmother 22       MI   Rheum arthritis Mother    Allergies Daughter    Colon cancer Neg Hx    BP 108/62   Pulse 94   Wt 95.6 kg (210 lb 12.8 oz)   SpO2 98%   BMI 35.08 kg/m    Wt Readings from Last 3 Encounters:  07/31/22 95.6 kg (210 lb 12.8 oz)  06/03/22 93.3 kg (205 lb 9.6 oz)  05/06/22 88 kg (194 lb)   Physical Exam General:  Well appearing. No resp difficulty HEENT: normal Neck: supple. no JVD. Carotids 2+ bilat; no bruits. No lymphadenopathy or thryomegaly appreciated. Cor: PMI nondisplaced. Regular rate & rhythm. No rubs, gallops or murmurs. Lungs: clear Abdomen: obese soft, nontender, nondistended. No hepatosplenomegaly. No bruits or masses. Good bowel sounds. Extremities: no cyanosis, clubbing, rash, edema Neuro: alert & orientedx3, cranial nerves grossly intact. moves all 4 extremities w/o difficulty. Affect pleasant   Device interrogation: HL score 0 on 05/30/22  ECG : artifact from barostim NSR with frequent PVCs 87 bpm  ASSESSMENT & PLAN: 1. Chronic systolic HF:   -  due to peri-partum CM, onset 1999.  - S/P [employer] ICD 07/2016 - LHC 6/17 No CAD - Echo 12/17 EF 20-25% - Echo 8/18 EF 25-30% s/p ICD 3/18.  - RHC 3/19: RA 2, PA 28/13, Fick CO 5.7, CI 2.8 - CPX 12/18 showed moderate limitation due mostly to obesity with some HF component - Echo 7/19 EF 50-55%, Grade 1 DD, Mild MR, Mod LAE - Echo 12/19 EF 20-25%. RV ok  - Echo 6/21 EF < 20%. - Echo 4/22 EF 20-25%, RV normal  - Has Barostim 6/22   - Echo 1/23 25-30%  - Echo 7/23 EF 25-30% - Recent Zio results worrisome for LMNA CM. - Needs cardiac PET at [facility] to look for sarcoid. Arrange genetic testing. - NYHA III-IIIB Volume looks okay on exam. HL score 8  - Continue torsemide 20 mg daily + 20 KCL daily. - Continue Jardiance 25 mg daily. - Continue Entesto 97-103 mg bid. - Continue spiro 25 mg qhs. - Increase Toprol to 100 daily  - Continue Corlanor 7.5 mg bid. - Unclear if PVCs are a result of her CM or contributing to it (or both).  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 Suspect progressive course. Will rever to Dr. Broadus John.  - Proceed with  PET scan and CPX testing - Suspect may need advanced therapies down the road  2. Frequent PVCs/ Bigeminy - Holter Monitor 6/19 with 30% PVCs with one primary morphology.  - S/p PVC ablation 11/2017 with Dr Lovena Le unsuccessful - Off flecainide with EF down and persistent PVCs.  - Zio Patch 04/2019 > 17% PVCs and >8% couplets.  - Zio Patch 11/28/2019 8.5 % PVCs - Zio 9/22 13% PVCs (unifocal)  - Per Dr. Lovena Le, due to the location of her PVCs, she is not a candidate for re-do ablation. Could consider referral to tertiary center - Zio 14 day (12/23) showed frequent NSVT, 13% PVC burden (see discussion above) - Continue amiodarone 200 mg daily. - Continue mexiletine 300 mg bid (increased in 1/24) but PVCs still frequent on ECG - Not sure many options left. Will d/w Dr. Lovena Le - Cardiac PET pending - wil re-order here  3. Morbid obesity - s/p gastric Sleeve at [facility] 09/20/17  - Body mass index is 35.08 kg/m.   4. Daytime Fatigue - Sleep study in 2019 and again in 2021 were both negative for sleep apnea   5. DM 2 - On Jardiance - Per PCP  Total time spent 45 minutes. Over half that time spent discussing above.   Glori Bickers, MD  9:47 AM

## 2022-07-31 NOTE — Patient Instructions (Signed)
INCREASE Toprol to 100 mg daily.  Your physician has requested that you have an echocardiogram. Echocardiography is a painless test that uses sound waves to create images of your heart. It provides your doctor with information about the size and shape of your heart and how well your heart's chambers and valves are working. This procedure takes approximately one hour. There are no restrictions for this procedure. Please do NOT wear cologne, perfume, aftershave, or lotions (deodorant is allowed). Please arrive 15 minutes prior to your appointment time.  Your physician has recommended that you have a cardiopulmonary stress test (CPX). CPX testing is a non-invasive measurement of heart and lung function. It replaces a traditional treadmill stress test. This type of test provides a tremendous amount of information that relates not only to your present condition but also for future outcomes. This test combines measurements of you ventilation, respiratory gas exchange in the lungs, electrocardiogram (EKG), blood pressure and physical response before, during, and following an exercise protocol.  How to Prepare for Your Cardiac PET/CT Stress Test:  1. Please do not take these medications before your test:   Medications that may interfere with the cardiac pharmacological stress agent (ex. nitrates - including erectile dysfunction medications, isosorbide mononitrate, tamulosin or beta-blockers) the day of the exam. (Erectile dysfunction medication should be held for at least 72 hrs prior to test) Theophylline containing medications for 12 hours. Dipyridamole 48 hours prior to the test. Your remaining medications may be taken with water.  2. Nothing to eat or drink, except water, 3 hours prior to arrival time.   NO caffeine/decaffeinated products, or chocolate 12 hours prior to arrival.  3. NO perfume, cologne or lotion  4. Total time is 1 to 2 hours; you may want to bring reading material for the waiting  time.  5. Please report to Radiology at the [facility] Main Entrance 30 minutes early for your test.  [place], [place] [address]  Diabetic Preparation:  Hold oral medications. You may take NPH and Lantus insulin. Do not take Humalog or Humulin R (Regular Insulin) the day of your test. Check blood sugars prior to leaving the house. If able to eat breakfast prior to 3 hour fasting, you may take all medications, including your insulin, Do not worry if you miss your breakfast dose of insulin - start at your next meal.  IF YOU THINK YOU MAY BE PREGNANT, OR ARE NURSING PLEASE INFORM THE TECHNOLOGIST.  In preparation for your appointment, medication and supplies will be purchased.  Appointment availability is limited, so if you need to cancel or reschedule, please call the Radiology Department at [phone]  24 hours in advance to avoid a cancellation fee of $100.00  What to Expect After you Arrive:  Once you arrive and check in for your appointment, you will be taken to a preparation room within the Radiology Department.  A technologist or Nurse will obtain your medical history, verify that you are correctly prepped for the exam, and explain the procedure.  Afterwards,  an IV will be started in your arm and electrodes will be placed on your skin for EKG monitoring during the stress portion of the exam. Then you will be escorted to the PET/CT scanner.  There, staff will get you positioned on the scanner and obtain a blood pressure and EKG.  During the exam, you will continue to be connected to the EKG and blood pressure machines.  A small, safe amount of a radioactive  tracer will be injected in your IV to obtain a series of pictures of your heart along with an injection of a stress agent.    After your Exam:  It is recommended that you eat a meal and drink a caffeinated beverage to counter act any effects of the stress agent.  Drink plenty of fluids for the remainder of  the day and urinate frequently for the first couple of hours after the exam.  Your doctor will inform you of your test results within 7-10 business days.  For questions about your test or how to prepare for your test, please call: Marchia Bond, Cardiac Imaging Nurse Navigator  Gordy Clement, Cardiac Imaging Nurse Navigator Office: [phone] ONCE APPROVED BY INSURANCE YOU WILL BE CALLED TO HAVE THE TEST ARRANGED.   You have been referred to the Electrophysiologist. They will call you to arrange your appointment.  You have been referred to Dr. Nira Conn for Genetic Counseling. Her office will call you to arrange your appointment   Your physician recommends that you schedule a follow-up appointment in: 3 months with an echocardiogram (June) ** please call the office in April to arrange your follow up appointment. **  If you have any questions or concerns before your next appointment please send [place] a message through [place] or call our office at [phone].    TO LEAVE A MESSAGE FOR THE NURSE SELECT OPTION 2, PLEASE LEAVE A MESSAGE INCLUDING: YOUR NAME DATE OF BIRTH CALL BACK NUMBER REASON FOR CALL**this is important as we prioritize the call backs  YOU WILL RECEIVE A CALL BACK THE SAME DAY AS LONG AS YOU CALL BEFORE 4:00 PM  At the [facility], you and your health needs are our priority. As part of our continuing mission to provide you with exceptional heart care, we have created designated Provider Care Teams. These Care Teams include your primary Cardiologist (physician) and Advanced Practice Providers (APPs- Physician Assistants and Nurse Practitioners) who all work together to provide you with the care you need, when you need it.   You may see any of the following providers on your designated Care Team at your next follow up: Dr Glori Bickers Dr Loralie Champagne Dr. Roxana Hires, NP Lyda Jester, [place] [facility],  [place] Forestine Na, NP Audry Riles, PharmD   Please be sure to bring in all your medications bottles to every appointment.    Thank you for choosing [facility]

## 2022-08-03 ENCOUNTER — Other Ambulatory Visit: Payer: Self-pay | Admitting: Internal Medicine

## 2022-08-03 DIAGNOSIS — Z1231 Encounter for screening mammogram for malignant neoplasm of breast: Secondary | ICD-10-CM

## 2022-08-04 ENCOUNTER — Ambulatory Visit: Payer: PPO | Admitting: Internal Medicine

## 2022-08-05 ENCOUNTER — Ambulatory Visit: Payer: PPO | Attending: Genetic Counselor | Admitting: Genetic Counselor

## 2022-08-05 ENCOUNTER — Encounter (HOSPITAL_COMMUNITY): Payer: Self-pay | Admitting: Internal Medicine

## 2022-08-05 ENCOUNTER — Telehealth: Payer: Self-pay | Admitting: *Deleted

## 2022-08-05 DIAGNOSIS — I5042 Chronic combined systolic (congestive) and diastolic (congestive) heart failure: Secondary | ICD-10-CM

## 2022-08-05 NOTE — Telephone Encounter (Signed)
No pre cert required for pet scan at [facility]. Msg sent to [facility] pet scan pool.

## 2022-08-05 NOTE — Progress Notes (Signed)
GYNECOLOGY  VISIT   HPI: 47 y.o.   Married  [place] American  female   [phone] with No LMP recorded. (Menstrual status: Oral contraceptives).   here for   [place] consult  Patient has had irregular and heavy bleeding.   At her last visit, I recommended she stop her combined oral contraception, and I prescribed progesterone only birth control, Micronor. She took Micronor and had continued and heavy bleeding and bleeding through with using super tampons.  She chose to stop her Micronor and she returned to her regular COCs, which she still had on hand.  She acknowledges that I do not recommend this.   Hgb 11.3 on 04/28/22.  Not bleeding at all with her combined oral contraception, which she describes as possible [place].    Some low back pain and some bladder pressure.   She has postpartum cardiomyopathy, CHF, HTN, and migraines with potential auditory aura. She has a defibrillator.  GYNECOLOGIC HISTORY: No LMP recorded. (Menstrual status: Oral contraceptives). Contraception: BTL/COC Menopausal hormone therapy:  n/a Last mammogram:  2019 per pt, WNL Last pap smear:   2019, WN: per pt, 08/01/10 normal        OB History     Gravida  2   Para  1   Term      Preterm      AB  1   Living  1      SAB      IAB  1   Ectopic      Multiple      Live Births                 Patient Active Problem List   Diagnosis Date Noted   Encounter for general adult medical examination with abnormal findings 05/04/2022   GERD (gastroesophageal reflux disease) 03/10/2022   Right wrist pain 04/11/2021   Obesity 02/28/2021   Greater trochanteric bursitis of right hip 12/26/2020   Congestive heart failure, NYHA class III ([place]) 11/08/2020   Asthma 08/25/2020   Iron deficiency anemia due to chronic blood loss 08/06/2020   Greater trochanteric bursitis, left 07/01/2020   V-tach ([place]) 11/12/2019   Headache 10/26/2018   Chronic right-sided low back pain without sciatica 06/24/2018    Patellar subluxation, left, initial encounter 04/25/2018   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   Insomnia 05/07/2017   Acute on chronic systolic (congestive) heart failure ([place]) 04/29/2017   ICD (implantable cardioverter-defibrillator), single, 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   Patellofemoral syndrome of both knees 10/16/2016   NICM (nonischemic cardiomyopathy) ([place]) 08/03/2016   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   Labral tear of shoulder 04/04/2015   Chronic combined systolic and diastolic heart failure ([place]) 03/05/2014   IBS (irritable bowel syndrome)    MDD (major depressive disorder) 01/20/2013   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   Essential hypertension     Past Medical History:  Diagnosis Date   Acute on chronic systolic (congestive) heart failure ([place]) 04/29/2017   Acute pain of right shoulder 06/16/2017   AICD (automatic cardioverter/defibrillator) present    Anginal pain (HCC)    Anxiety    Arthritis    right shoulder    Asthma    CHF (congestive heart failure) ([place])    Chronic combined systolic and diastolic heart failure ([place]) 03/05/2014   Closed low lateral malleolus fracture 10/23/2013   Cystitis 10/21/2017   Depression    Depression with anxiety 01/20/2013   Diabetes mellitus without complication (HCC)    Diverticulosis    Dyspnea  comes and goes intermittently mostly with exertion    Dysrhythmia    Essential hypertension    Prev followed by [name]    Fibroid    age 74   Gallstones    Generalized abdominal cramping    History of cardiomyopathy    Hypertension    IBS (irritable bowel syndrome)    ICD (implantable cardioverter-defibrillator), single, 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 CPAP intolerance >> "smothering" Good tolerance of auto device (optimal pressure 12-13 on download).  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   PVC (premature ventricular contraction) 06/23/2016   Seizures ([place])    "as a child" (08/03/2016)   Termination of pregnancy    due to cardiac risk    Past Surgical History:  Procedure Laterality Date   CARDIAC CATHETERIZATION N/A 11/11/2015   Procedure: Right/Left Heart Cath and Coronary Angiography;  Surgeon: Troy Sine, MD;  Location: [facility];  Service: Cardiovascular;  Laterality: N/A;   CARDIAC CATHETERIZATION  ~ 2015   CARDIAC DEFIBRILLATOR PLACEMENT  08/03/2016   CESAREAN SECTION  1999   COLONOSCOPY WITH PROPOFOL N/A 04/21/2016   Procedure: COLONOSCOPY WITH PROPOFOL;  Surgeon: Jerene Bears, MD;  Location: [facility];  Service: Gastroenterology;  Laterality: N/A;   ESOPHAGOGASTRODUODENOSCOPY (EGD) WITH PROPOFOL N/A 04/21/2016   Procedure: ESOPHAGOGASTRODUODENOSCOPY (EGD) WITH PROPOFOL;  Surgeon: Jerene Bears, MD;  Location: [facility];  Service: Gastroenterology;  Laterality: N/A;   FOOT FRACTURE SURGERY Right ~ 2003   FRACTURE SURGERY     ICD IMPLANT N/A 08/03/2016   Procedure: ICD Implant;  Surgeon: Deboraha Sprang, MD;  Location: [facility];  Service: Cardiovascular;  Laterality: N/A;   LAPAROSCOPIC CHOLECYSTECTOMY  12/2006   LAPAROSCOPIC GASTRIC SLEEVE RESECTION     LAPAROSCOPY ABDOMEN DIAGNOSTIC  2008   "cut bile duct w/gallbladder OR; had to go in later & fix leak; hospitalized for 2 months"   LEFT HEART CATHETERIZATION WITH CORONARY ANGIOGRAM N/A  02/26/2014   Procedure: LEFT HEART CATHETERIZATION WITH CORONARY ANGIOGRAM;  Surgeon: Jettie Booze, MD;  Location: [facility];  Service: Cardiovascular;  Laterality: N/A;   OVARIAN CYST REMOVAL Right 1999   PVC ABLATION N/A 12/01/2017   Procedure: PVC ABLATION;  Surgeon: Evans Lance, MD;  Location: [facility];  Service: Cardiovascular;  Laterality: N/A;   RIGHT HEART CATH N/A 08/05/2017   Procedure: RIGHT HEART CATH;  Surgeon: Jolaine Artist, MD;  Location: [place] [facility];  Service: Cardiovascular;  Laterality: N/A;   RIGHT HEART CATH N/A 11/16/2019   Procedure: RIGHT HEART CATH;  Surgeon: Jolaine Artist, MD;  Location: [place];  Service: Cardiovascular;  Laterality: N/A;   TUBAL LIGATION  1999    Current Outpatient Medications  Medication Sig Dispense Refill  acetaminophen (TYLENOL) 650 MG CR tablet Take 650 mg by mouth every 8 (eight) hours as needed for pain (headache).     albuterol (VENTOLIN HFA) 108 (90 Base) MCG/ACT inhaler INHALE 1 TO 2 PUFFS BY MOUTH EVERY 6 HOURS AS NEEDED FOR WHEEZING FOR SHORTNESS OF BREATH 18 g 0   amiodarone (PACERONE) 200 MG tablet Take 1 tablet (200 mg total) by mouth daily. 90 tablet 3   budesonide-formoterol (SYMBICORT) 160-4.5 MCG/ACT inhaler Inhale 2 puffs into the lungs 2 (two) times daily as needed. 10 g 3   CORLANOR 7.5 MG TABS tablet TAKE 1 TABLET BY MOUTH TWICE DAILY WITH A MEAL 60 tablet 11   dicyclomine (BENTYL) 20 MG tablet Take 1 tablet (20 mg total) by mouth daily as needed for spasms. 90 tablet 3   empagliflozin (JARDIANCE) 25 MG TABS tablet Take 1 tablet (25 mg total) by mouth daily before breakfast. 90 tablet 3   fluticasone (FLONASE) 50 MCG/ACT nasal spray Place 2 sprays into both nostrils daily as needed for allergies. 16 g 3   gabapentin (NEURONTIN) 300 MG capsule Take 1 capsule (300 mg total) by mouth at bedtime as needed (back spasms). 90 capsule 3   levonorgestrel-ethinyl estradiol (SEASONALE)  0.15-0.03 MG tablet Take 1 tablet by mouth daily. Uncertain the name of the birth control pill.     lidocaine (LIDODERM) 5 % Place 1 patch onto the skin daily. Remove & Discard patch within 12 hours or as directed by MD 90 patch 3   Magnesium Oxide 200 MG TABS Take 1 tablet (200 mg total) by mouth daily. 90 tablet 3   metoprolol succinate (TOPROL-XL) 100 MG 24 hr tablet Take 1 tablet (100 mg total) by mouth daily. Take with or immediately following a meal. 90 tablet 3   mexiletine (MEXITIL) 150 MG capsule Take 2 capsules (300 mg total) by mouth 2 (two) times daily. 120 capsule 11   mirtazapine (REMERON) 15 MG tablet Take 1 tablet (15 mg total) by mouth at bedtime. 30 tablet 3   Multiple Vitamins-Minerals (MULTIVITAMIN GUMMIES WOMENS PO) Take 2 tablets by mouth daily.     nitroGLYCERIN (NITROSTAT) 0.4 MG SL tablet Place 1 tablet (0.4 mg total) under the tongue every 5 (five) minutes as needed for chest pain. 75 tablet 0   potassium chloride SA (KLOR-CON M) 20 MEQ tablet Take 2 tablets (40 mEq total) by mouth daily. 180 tablet 3   sacubitril-valsartan (ENTRESTO) 97-103 MG Take 1 tablet by mouth 2 (two) times daily. 60 tablet 3   spironolactone (ALDACTONE) 25 MG tablet Take 1 tablet (25 mg total) by mouth at bedtime. 90 tablet 3   tizanidine (ZANAFLEX) 2 MG capsule Take 2 mg by mouth as needed for muscle spasms.     torsemide (DEMADEX) 20 MG tablet Take 1 tablet (20 mg total) by mouth daily. Can take extra 20 mg as needed for swelling     Ubrogepant (UBRELVY) 100 MG TABS Take 100 mg by mouth daily as needed (migraine). 30 tablet 3   Vitamin D, Ergocalciferol, (DRISDOL) 1.25 MG (50000 UNIT) CAPS capsule TAKE 1 CAPSULE BY MOUTH EVERY 7 DAYS 12 capsule 0   No current facility-administered medications for this visit.     ALLERGIES: Vancomycin, Aspirin, Contrast media [iodinated contrast media], Ciprofloxacin, Cyclobenzaprine, Farxiga [dapagliflozin], and Sulfa antibiotics  Family History  Problem  Relation Age of Onset   Emphysema Maternal Grandmother        smoked   Heart disease Maternal Grandmother 39  MI   Rheum arthritis Mother    Allergies Daughter    Colon cancer Neg Hx     Social History   Socioeconomic History   Marital status: Married    Spouse name: Not on file   Number of children: 1   Years of education: Not on file   Highest education level: Associate degree: occupational, Hotel manager, or vocational program  Occupational History   Occupation: stay at home mom  Tobacco Use   Smoking status: Never   Smokeless tobacco: Never  Vaping Use   Vaping Use: Never used  Substance and Sexual Activity   Alcohol use: No   Drug use: No   Sexual activity: Yes    Birth control/protection: Surgical, Pill    Comment: BTL  Other Topics Concern   Not on file  Social History Narrative   Not on file   Social Determinants of Health   Financial Resource Strain: Low Risk  (08/18/2022)   Overall Financial Resource Strain (CARDIA)    Difficulty of Paying Living Expenses: Not hard at all  Food Insecurity: No Food Insecurity (08/18/2022)   Hunger Vital Sign    Worried About Running Out of Food in the Last Year: Never true    [place] in the Last Year: Never true  Transportation Needs: No Transportation Needs (08/18/2022)   PRAPARE - Hydrologist (Medical): No    Lack of Transportation (Non-Medical): No  Physical Activity: Insufficiently Active (08/18/2022)   Exercise Vital Sign    Days of Exercise per Week: 1 day    Minutes of Exercise per Session: 30 min  Stress: Stress Concern Present (08/18/2022)   [place]    Feeling of Stress : Very much  Social Connections: Moderately Isolated (08/18/2022)   Social Connection and Isolation Panel [NHANES]    Frequency of Communication with Friends and Family: Once a week    Frequency of Social Gatherings with Friends and Family:  Never    Attends Religious Services: Never    Marine scientist or Organizations: Yes    Attends Music therapist: More than 4 times per year    Marital Status: Married  Human resources officer Violence: Not on file    Review of Systems  See HPI.  PHYSICAL EXAMINATION:    BP 122/80 (BP Location: Right Arm, Patient Position: Sitting, Cuff Size: Large)   Pulse (!) 46   Ht 5\' 5"  (1.651 m)   Wt 212 lb (96.2 kg)   SpO2 99%   BMI 35.28 kg/m     General appearance: alert, cooperative and appears stated age   Pelvic US Uterus 10.16 x 10.39 x 9.18 cm.  Multiple fibroids:  4.14 cm, 3.40 cm, 1.41 cm, 1.55 cm, 3.32 cm, 3.55 cm, 2.35 cm, 1.70 cm, 2.75 cm, 1.56 cm, 1.25 cm, 0.93 cm.  One fibroid 1.1 cm appears to be submucosal. Patient has many more small fibroids that were not measured.  EMS 5.2 mm.  It is distorted by fibroids.  Left ovary 2.95 x 1.60 x 1.42 cm.  Right ovary 2.62 x 1.18 x 1.26 cm.  No adnexal masses.  No free fluid.     ASSESSMENT  Multifibroid uterus with one small subserosal fibroid.  Nonischemic cardiomyompathy with CHF.  Migraine with possible aura.  HTN. Defibrillator patient.  On combined contraception against medical advice.   PLAN  We reviewed her pelvic ultrasound images and report.  Treatment options for fibroids reviewed:  Depo Provera, GNRH antagonist, uterine artery and hysterectomy.   Will work toward uterine artery embolization.  I recommend she return for an endometrial biopsy.  Will order pelvic MRI for her fibroids.  Will make referral to interventional radiology.   35 min  total time was spent for this patient encounter, including preparation, face-to-face counseling with the patient, coordination of care, and documentation of the encounter.

## 2022-08-07 ENCOUNTER — Ambulatory Visit: Payer: PPO | Admitting: Internal Medicine

## 2022-08-10 NOTE — Progress Notes (Signed)
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 African American woman who was found to have post partum cardiomyopathy one month after delivering her first child. She reports getting pregnant 11 months later and began having symptoms of chest pains, dyspnea, and heart palpitations soon after. She reports having to terminate the pregnancy upon her physician's recommendations as she was at serious risk of an adverse event if she went to term. She had a tubal ligation soon after.   She now has chronic combined systolic and diastolic CHF and has undergone an ablation in 2019 for her frequent PVCs. Reports that her LVEF had improved significantly after this procedure but began to trend downwards a year later to 25% to 30%. She is now on GDMT.    Family history [name] (III.1) has a 75 y.o daughter who is currently pursuing her Masters at [employer]. She has had a normal EKG and is asymptomatic.   She does not know her father (II.1), deceased or his relatives.  Her mother (II.2) is now 64 and in great health. Nor reports of cardiac disease in her maternal uncles (II.3, II.4, II.5- murdered at 57) and aunt (II.5). She does state that her aunt has a heart murmur all her life. Maternal grandfather (I.1) died at 78 from cardiac and renal failure. He had a CABG in the past. Maternal grandmother (I.2) died at 63 from complication s of M.I. She was a heavy smoker and underwent lung resection.   Genetic Consult I reviewed the different genetic cardiomyopathies that are considered as non-ischemic cardiomyopathy, namely ARVC (now called ACM), DCM, HCM and LVNC. I discussed the genetics of HCM, DCM, LVNC and ACM, namely inheritance, incomplete  penetrance, variable expression and digenic/compound mutations that can be seen in some patients.   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 Titin is involved in passive force transmission during muscle contraction and plays an essential role in sarcomere organization, elasticity and cell signaling.   This note also includes the re-interpretation of her genetic test result.  This variant was re-interpreted to verify the test report interpretation and to compile the information in light of the patient's clinical presentation and family history.  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 TTN gene encodes 364 exons that undergo extensive alternate splicing to produce several TTN isoforms ranging in length from 5604 to 34,350 amino acids. In the adult myocardium, isoform N2BA and N2B are the two major full-length titin isoforms that are robustly expressed, in addition to low abundance short Novex isoforms. Heart muscles co-express 2970-kDa N2B titin (B) and 3300-kDa N2BA titin as major isoforms. In addition, novex-1/N2B and novex-2/N2B isoforms are expressed as minor heart-specific isoforms.   Typically, protein truncating variants are considered pathogenic as these are loss of function mutations. However, TTN gene is unique, in that TTN truncating variants (TTNtv) are observed the general population; nearly 2% of individuals without overt cardiomyopathy harbor a TTN truncating variant. It is currently thought that TTNtv that reside towards the carboxyl-terminus of titin protein and localize in the sarcomere A-band, truncates  the two principal isoforms of TTN (TTN N2B and TTN N2BA). A large number of pathogenic truncating variants in the A band have been linked  to DCM with severely impaired LV function and life-threatening ventricular arrhythmias. Truncating variants of TTN (TTNtv) especially in the A-band region account for 20% of dilated cardiomyopathy (DCM) cases.   The TTN variant described in [name] is in exon 153 and falls within the A band of the sarcomere. This TTN variant is not reported in gnomAD, a large population database, indicating that it is a rare variant. However, there are no reports of this variant in patients with NICM. To date, this variant has been reported in 6 clinical labs but no clinical data is available on these patients. 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 Additional functional studies and/or  family studies that shows this variant segregates with disease is needed to confirm its pathogenicity.   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 As there are no affected family members to determine if this variant segregates with disease, genetic testing her daughter for this familial TTN variant is unwarranted.  She verbalized understanding.   In addition, we discussed the protections afforded by the Genetic Information Non-Discrimination Act (GINA). I explained to her that GINA protects her from losing employment or health insurance based on his genotype. However, these protections do not cover life insurance and disability. She verbalized understanding of this and informs me that her child does not have life insurance.  She will also try to procure additional information regarding her paternal relatives and will  keep updated if she finds any relevant cardiac history.   Lattie Corns, Ph.D, [facility] Clinical Molecular Geneticist

## 2022-08-11 NOTE — Telephone Encounter (Signed)
@  [name] See the attachment for the [place] for your records [facility] packet faxed 08/10/22)  -also seems like scheduling wants to get done at [place] so if pre cert reaches out to change location you know why

## 2022-08-12 ENCOUNTER — Telehealth: Payer: Self-pay

## 2022-08-12 NOTE — Telephone Encounter (Signed)
Received call from patient.  She reports her heart is racing especially at night and she is always feeling "drained".  She is having trouble sleeping and feels like she has been running a race all night.    She has reported the racing heart to Dr Vaughan Browner at last office visit and he changed the Metolprolol dosage.  Dr Haroldine Laws scheduled some CPX and PET scan in April.     Her question is:  Is there anything else that can be tried to help with the racing heart?    3/15 [place] report shows normal fluid levels with Night Heart Rate ranging 69-100.    Advised would send to Dr Haroldine Laws for review and will call her back if he makes any recommendations.

## 2022-08-13 NOTE — Telephone Encounter (Signed)
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 Her avg HR has been in the 70s-90s range which is reassuring. She is already on 2 antiarrythmic's, amiodarone and mexiletine. Would not make further changes unless EP recommends dose adjustment. Dr. Haroldine Laws had referred her back to her electrophysiologist, Dr. Lovena Le. She should keep upcomming appt.   Cecille Rubin, did her device detect any sustained VT??

## 2022-08-13 NOTE — Telephone Encounter (Signed)
Attempted to reach patient. No answer. 

## 2022-08-14 NOTE — Telephone Encounter (Signed)
Spoke with patient to send a remote transmission, she stated she would. Presenting rhythm form 08/08/22 was irregular R-R intervals with HR of 120bpm    Remote transmission received 08/14/22 at 10:21 AM   Presenting Rhythm: VS at 112bpm. Night HR trending up at 101 -103 bpm   Patient has no more NSVT episodes  Will send EP scheduler for [facility] apt.

## 2022-08-18 ENCOUNTER — Ambulatory Visit (INDEPENDENT_AMBULATORY_CARE_PROVIDER_SITE_OTHER): Payer: PPO | Admitting: Internal Medicine

## 2022-08-18 ENCOUNTER — Ambulatory Visit: Payer: PPO | Admitting: Obstetrics and Gynecology

## 2022-08-18 ENCOUNTER — Other Ambulatory Visit: Payer: Self-pay | Admitting: Obstetrics and Gynecology

## 2022-08-18 ENCOUNTER — Telehealth: Payer: Self-pay | Admitting: Obstetrics and Gynecology

## 2022-08-18 ENCOUNTER — Encounter: Payer: Self-pay | Admitting: Obstetrics and Gynecology

## 2022-08-18 ENCOUNTER — Ambulatory Visit (INDEPENDENT_AMBULATORY_CARE_PROVIDER_SITE_OTHER): Payer: PPO

## 2022-08-18 VITALS — BP 122/80 | HR 46 | Ht 65.0 in | Wt 212.0 lb

## 2022-08-18 VITALS — BP 126/84 | HR 95 | Temp 97.6°F | Ht 65.0 in | Wt 212.0 lb

## 2022-08-18 DIAGNOSIS — F331 Major depressive disorder, recurrent, moderate: Secondary | ICD-10-CM | POA: Diagnosis not present

## 2022-08-18 DIAGNOSIS — D219 Benign neoplasm of connective and other soft tissue, unspecified: Secondary | ICD-10-CM

## 2022-08-18 DIAGNOSIS — N939 Abnormal uterine and vaginal bleeding, unspecified: Secondary | ICD-10-CM

## 2022-08-18 DIAGNOSIS — I5042 Chronic combined systolic (congestive) and diastolic (congestive) heart failure: Secondary | ICD-10-CM | POA: Diagnosis not present

## 2022-08-18 MED ORDER — MIRTAZAPINE 15 MG PO TABS: 15.0000 mg | ORAL_TABLET | Freq: Every day | ORAL | 3 refills | Status: DC

## 2022-08-18 NOTE — Telephone Encounter (Signed)
Please assist with scheduling of appointments.   Patient needs: - Endometrial biopsy with me due to abnormal uterine bleeding.  - Pelvic MRI due to fibroids. - Consultation with interventional radiology for potential uterine artery embolization.  This would take place after her endometrial biopsy and pelvic MRI are done.   Thank you.

## 2022-08-18 NOTE — Patient Instructions (Signed)
Endometrial Biopsy  An endometrial biopsy is a procedure to remove tissue samples from the endometrium, which is the lining of the uterus. The tissue that is removed can then be checked under a microscope for disease. This procedure is used to diagnose conditions such as endometrial cancer, endometrial tuberculosis, polyps, or other inflammatory conditions. This procedure may also be used to investigate uterine bleeding to determine where you are in your menstrual cycle or how your hormone levels are affecting the lining of the uterus. Tell a health care provider about: Any allergies you have. All medicines you are taking, including vitamins, herbs, eye drops, creams, and over-the-counter medicines. Any problems you or family members have had with anesthetic medicines. Any bleeding problems you have. Any surgeries you have had. Any medical conditions you have. Whether you are pregnant or may be pregnant. What are the risks? Your health care provider will talk with you about risks. These may include: Bleeding. Pelvic infection. Puncture of the wall of the uterus with the biopsy device (rare). Allergic reactions to medicines. What happens before the procedure? Keep a record of your menstrual cycles as told by your health care provider. You may need to schedule your procedure for a specific time in your cycle. Bring a sanitary pad in case you need to wear one after the procedure. Ask your health care provider about: Changing or stopping your regular medicines. These include any diabetes medicines or blood thinners you take. Taking medicines such as aspirin and ibuprofen. These medicines can thin your blood. Do not take these medicines unless your health care provider tells you to. Taking over-the-counter medicines, vitamins, herbs, and supplements. Plan to have someone take you home from the hospital or clinic. What happens during the procedure? You will lie on an exam table with your feet  and legs supported as in a pelvic exam. Your health care provider will insert an instrument into your vagina to see your cervix. Your cervix will be cleansed with an antiseptic solution. A medicine (local anesthetic) will be used to numb the cervix. A forceps instrument will be used to hold your cervix steady for the biopsy. A thin, rod-like instrument (uterine sound) will be inserted through your cervix to determine the length of your uterus and the location where the biopsy sample will be removed. A thin, flexible tube (catheter) will be inserted through your cervix and into the uterus. The catheter will be used to collect the biopsy sample from your endometrial tissue. The tube and instruments will be removed, and the tissue sample will be sent to a lab for examination. The procedure may vary among health care providers and hospitals. What happens after the procedure? Your blood pressure, heart rate, breathing rate, and blood oxygen level will be monitored until you leave the hospital or clinic. It is up to you to get the results of your procedure. Ask your health care provider, or the department that is doing the procedure, when your results will be ready. Summary An endometrial biopsy is a procedure to remove tissue samples from the endometrium, which is the lining of the uterus. This procedure is used to diagnose conditions such as endometrial cancer, endometrial tuberculosis, polyps, or other inflammatory conditions. It is up to you to get the results of your procedure. Ask your health care provider, or the department that is doing the procedure, when your results will be ready. This information is not intended to replace advice given to you by your health care provider. Make sure you  discuss any questions you have with your health care provider. Document Revised: 08/26/2021 Document Reviewed: 08/26/2021 Elsevier Patient Education  [employer].   Uterine Artery Embolization for  Fibroids Uterine artery embolization is a procedure to shrink uterine fibroids. Uterine fibroids are abnormal growths of tissue (tumors) that can develop in the uterus and cause heavy menstrual bleeding and pain. This type of tumor is not cancerous (is benign). Fibroids can vary in size, shape, weight, and where they grow in the uterus. In this procedure, a small, thin tube (catheter) is used to inject tiny particle beads that block the blood supply to the fibroid. This causes the fibroid to shrink. Tell a health care provider about: Any allergies you have. All medicines you are taking, including vitamins, herbs, eye drops, creams, and over-the-counter medicines. Any problems you or family members have had with anesthetic medicines. Any blood disorders you have. Any surgeries you have had. Any medical conditions you have. Whether you are pregnant or may be pregnant. What are the risks? Generally, this is a safe procedure. However, problems may occur, including: Bleeding. Allergic reactions to medicines or dyes. Damage to nearby structures or organs. Infection, including blood infection. Injury to the uterus from decreased blood supply. Lack of menstrual periods. Other problems may occur, including: Death of tissue cells around your bladder or vulva. A hole that may develop between organs, or from an organ to the surface of your skin. Blood clot in the legs or lung. What happens before the procedure? Staying hydrated Follow instructions from your health care provider about hydration, which may include: Up to 2 hours before the procedure - you may continue to drink clear liquids, such as water, clear fruit juice, black coffee, and plain tea.  Eating and drinking restrictions Follow instructions from your health care provider about eating and drinking, which may include: 8 hours before the procedure - stop eating heavy meals or foods, such as meat, fried foods, or fatty foods. 6 hours  before the procedure - stop eating light meals or foods, such as toast or cereal. 6 hours before the procedure - stop drinking milk or drinks that contain milk. 2 hours before the procedure - stop drinking clear liquids. Medicines Ask your health care provider about: Changing or stopping your regular medicines. This is especially important if you are taking diabetes medicines or blood thinners. Taking over-the-counter medicines, vitamins, herbs, and supplements. Taking medicines such as aspirin and ibuprofen. These medicines can thin your blood. Do not take these medicines unless your health care provider tells you to take them. You may be given medicine to prevent nausea and vomiting. General instructions Do not use any products that contain nicotine or tobacco for at least 4 weeks before the procedure. These products include cigarettes, chewing tobacco, and vaping devices, such as e-cigarettes. If you need help quitting, ask your health care provider. Ask your health care provider: What steps will be taken to help prevent infection. These steps may include: Removing hair at the surgery site. Washing skin with a germ-killing soap. Taking antibiotic medicine. You will be asked to empty your bladder before the procedure. Plan to have a responsible adult take you home from the hospital or clinic. Plan to have a responsible adult care for you for the time you are told after you leave the hospital or clinic. This is important. What happens during the procedure?  An IV will be inserted into one of your veins. You will be given one or more of  the following: A medicine to help you relax (sedative). A medicine to numb the area (local anesthetic). A small cut (incision) will be made in your groin to find the main artery in your leg. A catheter will be inserted into the main artery in your leg and guided to your uterus. Images and X-rays will be taken while dye is injected through the catheter. This  shows the blood supply to your uterus and fibroids. Tiny particles, about the size of grains of sand, will be injected through the catheter and will lodge in tiny branches of the uterine artery. This will block the blood supply to the fibroids and cause them to shrink. Metal coils may also be used to help block the artery. The procedure will be repeated on the artery supplying blood to the other side of the uterus. The catheter will be removed and a bandage (dressing) will be placed over the incision. The procedure may vary among health care providers and hospitals. What can I expect after the procedure? Your blood pressure, heart rate, breathing rate, and blood oxygen level will be monitored until you leave the hospital or clinic. You will be given pain medicine as needed. You may be given medicine for nausea and vomiting as needed. If you were given a sedative during the procedure, it can affect you for several hours. Do not drive or operate machinery until your health care provider says it is safe. Summary Uterine artery embolization is a procedure to shrink uterine fibroids by blocking their blood supply. You may be given a medicine to help you relax (sedative) and a medicine to numb the area (local anesthetic) for the procedure. Tiny particles will be injected in the artery to your uterus to block blood flow and shrink the fibroid. After the procedure, you will be given medicine for pain and nausea as needed. If you were given a sedative during the procedure, it can affect you for several hours. Do not drive or operate machinery until your health care provider says it is safe. This information is not intended to replace advice given to you by your health care provider. Make sure you discuss any questions you have with your health care provider. Document Revised: 12/18/2019 Document Reviewed: 12/18/2019 Elsevier Patient Education  [employer].

## 2022-08-18 NOTE — Progress Notes (Unsigned)
   Subjective:   Patient ID: [name], female    DOB: [birth year], 47 y.o.   MRN: [id number]  HPI The patient is a 47 YO female coming in for coping problems. Recent extra stress with genetic testing for her heart condition and feeling unsure if her daughter (3) will struggle with this. She has had thoughts of leaving to unburden her family. Not doing counseling currently. Coping with some spending which is manageable at the time.   Review of Systems  Objective:  Physical Exam  Vitals:   08/18/22 1359  BP: 126/84  Pulse: 95  Temp: 97.6 F (36.4 C)  TempSrc: Temporal  SpO2: 100%  Weight: 212 lb (96.2 kg)  Height: 5\' 5"  (1.651 m)    Assessment & Plan:

## 2022-08-19 ENCOUNTER — Encounter: Payer: Self-pay | Admitting: Internal Medicine

## 2022-08-19 NOTE — Telephone Encounter (Signed)
Message sent to appt desk scheduler to arrange Endo Biopsy appt.  Order Placed in [place] for pelvic MRI and I spoke with patient and provided her phone number for [facility] scheduler so that she can arrange/answer screening questions.  I went ahead and placed Amb referral order for consultation with Interventional Radiology but made a note on the referral that it needs to be after endo bx and MRI pelvis and patient is in process of scheduling those appts.  I will hold this encounter to follow appts.

## 2022-08-19 NOTE — Assessment & Plan Note (Signed)
This has caused some stress to her recently with uncertain genetic results. This has brought up more concern about her daughter 49 YO and the risk of transference to her daughter of heart failure issues.

## 2022-08-19 NOTE — Assessment & Plan Note (Signed)
BMI 35 and complicated by hypertension and depression. She is working on exercise to help her heart but is limited.

## 2022-08-19 NOTE — Assessment & Plan Note (Signed)
Moderate at least currently. She is using spending to cope and this is manageable for now but she understands she may need to work with other strategies. Advised her to start counseling to help to adapt other coping skills. Rx remeron 15 mg qhs as she is struggling with sleep as well. She is having thoughts of being gone from her family not associated with suicidality and we discussed how this is associated with her severe health conditions. She does have some support network but notices due to her health conditions she is not always included.

## 2022-08-19 NOTE — Telephone Encounter (Signed)
Thank you for assisting with these appointments.

## 2022-08-20 ENCOUNTER — Ambulatory Visit: Payer: PPO | Attending: Internal Medicine | Admitting: Internal Medicine

## 2022-08-20 ENCOUNTER — Encounter: Payer: Self-pay | Admitting: Internal Medicine

## 2022-08-20 VITALS — BP 124/82 | HR 93 | Ht 65.0 in | Wt 211.4 lb

## 2022-08-20 DIAGNOSIS — I5042 Chronic combined systolic (congestive) and diastolic (congestive) heart failure: Secondary | ICD-10-CM | POA: Diagnosis not present

## 2022-08-20 DIAGNOSIS — I493 Ventricular premature depolarization: Secondary | ICD-10-CM

## 2022-08-20 DIAGNOSIS — I428 Other cardiomyopathies: Secondary | ICD-10-CM

## 2022-08-20 NOTE — Telephone Encounter (Signed)
EMB scheduled for 09/08/2022  Per MRI/IR appt notes: 1st attempt to contact pt today to schedule, LMOM//js

## 2022-08-20 NOTE — Progress Notes (Signed)
HPI [name] returns today for followup. She is a pleasant 47 yo woman with a h/o chronic systolic heart failure, HTN, obesity and lung disease. Her CHF symptoms are class 3.  She denies syncope or ICD shocks. No chest pain.  She has had worsening CHF and is being followed by the CHF team. She notes more dyspnea and orthopnea. A zio monitor 2 months ago demonstrated 13% PVC' but also a significant number of couplets and triplets. She underwent catheter ablation of her PVC's 4 years ago. Her PVC's could be suppressed but would always return, being mapped to the junction of the LV/RV OT and AMC. Her PVC's mapped earlies to the distal CS around 13:00 in the LAO projection. RF delivered in the CS had no effect on the PVC's. She does not have palpitations. She has been placed on amiodarone and mexilitine. Allergies  Allergen Reactions   Vancomycin Other (See Comments)    "did something to my kidneys," PROGRESSED TO KIDNEY FAILURE!!   Aspirin Other (See Comments)    Wheezing, (Pt states that she just wheezes some when she takes aspirin by itself but she can take aspirin in a combination product).   Contrast Media [Iodinated Contrast Media] Other (See Comments)    Multiple CT contrast studies done over 2 weeks caused ARF   Ciprofloxacin Itching and Rash   Cyclobenzaprine Other (See Comments)    Can not tolerate   [place] [Dapagliflozin] Rash   Sulfa Antibiotics Itching and Rash     Current Outpatient Medications  Medication Sig Dispense Refill   acetaminophen (TYLENOL) 650 MG CR tablet Take 650 mg by mouth every 8 (eight) hours as needed for pain (headache).     albuterol (VENTOLIN HFA) 108 (90 Base) MCG/ACT inhaler INHALE 1 TO 2 PUFFS BY MOUTH EVERY 6 HOURS AS NEEDED FOR WHEEZING FOR SHORTNESS OF BREATH 18 g 0   amiodarone (PACERONE) 200 MG tablet Take 1 tablet (200 mg total) by mouth daily. 90 tablet 3   budesonide-formoterol (SYMBICORT) 160-4.5 MCG/ACT inhaler Inhale 2 puffs into the  lungs 2 (two) times daily as needed. 10 g 3   CORLANOR 7.5 MG TABS tablet TAKE 1 TABLET BY MOUTH TWICE DAILY WITH A MEAL 60 tablet 11   dicyclomine (BENTYL) 20 MG tablet Take 1 tablet (20 mg total) by mouth daily as needed for spasms. 90 tablet 3   empagliflozin (JARDIANCE) 25 MG TABS tablet Take 1 tablet (25 mg total) by mouth daily before breakfast. 90 tablet 3   fluticasone (FLONASE) 50 MCG/ACT nasal spray Place 2 sprays into both nostrils daily as needed for allergies. 16 g 3   gabapentin (NEURONTIN) 300 MG capsule Take 1 capsule (300 mg total) by mouth at bedtime as needed (back spasms). 90 capsule 3   levonorgestrel-ethinyl estradiol (SEASONALE) 0.15-0.03 MG tablet Take 1 tablet by mouth daily. Uncertain the name of the birth control pill.     lidocaine (LIDODERM) 5 % Place 1 patch onto the skin daily. Remove & Discard patch within 12 hours or as directed by MD 90 patch 3   Magnesium Oxide 200 MG TABS Take 1 tablet (200 mg total) by mouth daily. 90 tablet 3   metoprolol succinate (TOPROL-XL) 100 MG 24 hr tablet Take 1 tablet (100 mg total) by mouth daily. Take with or immediately following a meal. 90 tablet 3   mexiletine (MEXITIL) 150 MG capsule Take 2 capsules (300 mg total) by mouth 2 (two) times daily. 120 capsule  11   mirtazapine (REMERON) 15 MG tablet Take 1 tablet (15 mg total) by mouth at bedtime. 30 tablet 3   Multiple Vitamins-Minerals (MULTIVITAMIN GUMMIES WOMENS PO) Take 2 tablets by mouth daily.     nitroGLYCERIN (NITROSTAT) 0.4 MG SL tablet Place 1 tablet (0.4 mg total) under the tongue every 5 (five) minutes as needed for chest pain. 75 tablet 0   potassium chloride SA (KLOR-CON M) 20 MEQ tablet Take 2 tablets (40 mEq total) by mouth daily. 180 tablet 3   sacubitril-valsartan (ENTRESTO) 97-103 MG Take 1 tablet by mouth 2 (two) times daily. 60 tablet 3   spironolactone (ALDACTONE) 25 MG tablet Take 1 tablet (25 mg total) by mouth at bedtime. 90 tablet 3   tizanidine (ZANAFLEX) 2  MG capsule Take 2 mg by mouth as needed for muscle spasms.     torsemide (DEMADEX) 20 MG tablet Take 1 tablet (20 mg total) by mouth daily. Can take extra 20 mg as needed for swelling     Ubrogepant (UBRELVY) 100 MG TABS Take 100 mg by mouth daily as needed (migraine). 30 tablet 3   Vitamin D, Ergocalciferol, (DRISDOL) 1.25 MG (50000 UNIT) CAPS capsule TAKE 1 CAPSULE BY MOUTH EVERY 7 DAYS 12 capsule 0   No current facility-administered medications for this visit.     Past Medical History:  Diagnosis Date   Acute on chronic systolic (congestive) heart failure (HCC) 04/29/2017   Acute pain of right shoulder 06/16/2017   AICD (automatic cardioverter/defibrillator) present    Anginal pain (HCC)    Anxiety    Arthritis    right shoulder    Asthma    CHF (congestive heart failure) ([place])    Chronic combined systolic and diastolic heart failure ([place]) 03/05/2014   Closed low lateral malleolus fracture 10/23/2013   Cystitis 10/21/2017   Depression    Depression with anxiety 01/20/2013   Diabetes mellitus without complication (HCC)    Diverticulosis    Dyspnea    comes and goes intermittently mostly with exertion    Dysrhythmia    Essential hypertension    Prev followed by [name]    Fibroid    age 45   Gallstones    Generalized abdominal cramping    History of cardiomyopathy    Hypertension    IBS (irritable bowel syndrome)    ICD (implantable cardioverter-defibrillator), single, 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 CPAP intolerance >> "smothering" Good tolerance of auto device (optimal pressure 12-13 on download).  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  Past Surgical History:  Procedure Laterality Date   CARDIAC CATHETERIZATION N/A 11/11/2015   Procedure: Right/Left Heart Cath and Coronary Angiography;  Surgeon: Troy Sine, MD;  Location: [facility];  Service: Cardiovascular;  Laterality: N/A;   CARDIAC CATHETERIZATION  ~ 2015   CARDIAC DEFIBRILLATOR PLACEMENT  08/03/2016   CESAREAN SECTION  1999   COLONOSCOPY WITH PROPOFOL N/A 04/21/2016   Procedure: COLONOSCOPY WITH PROPOFOL;  Surgeon: Jerene Bears, MD;  Location: [facility];  Service: Gastroenterology;  Laterality: N/A;   ESOPHAGOGASTRODUODENOSCOPY (EGD) WITH PROPOFOL N/A 04/21/2016   Procedure: ESOPHAGOGASTRODUODENOSCOPY (EGD) WITH PROPOFOL;  Surgeon: Jerene Bears, MD;  Location: [facility];  Service: Gastroenterology;  Laterality: N/A;   FOOT FRACTURE SURGERY Right ~ 2003   FRACTURE SURGERY     ICD IMPLANT N/A 08/03/2016   Procedure: ICD Implant;  Surgeon: Deboraha Sprang, MD;  Location: [facility];  Service: Cardiovascular;  Laterality: N/A;   LAPAROSCOPIC CHOLECYSTECTOMY  12/2006   LAPAROSCOPIC GASTRIC SLEEVE RESECTION     LAPAROSCOPY ABDOMEN DIAGNOSTIC  2008   "cut bile duct w/gallbladder OR; had to go in later & fix leak; hospitalized for 2 months"   LEFT HEART CATHETERIZATION WITH CORONARY ANGIOGRAM N/A 02/26/2014   Procedure: LEFT HEART CATHETERIZATION WITH CORONARY ANGIOGRAM;  Surgeon: Jettie Booze, MD;  [facility];  Service: Cardiovascular;  Laterality: N/A;   OVARIAN CYST REMOVAL Right 1999   PVC ABLATION N/A 12/01/2017   Procedure: PVC ABLATION;  Surgeon: Evans Lance, MD;  Location: [facility];  Service: Cardiovascular;  Laterality: N/A;   RIGHT HEART CATH N/A 08/05/2017   Procedure: RIGHT HEART CATH;  Surgeon: Jolaine Artist, MD;  Location: [facility];  Service: Cardiovascular;  Laterality: N/A;   RIGHT HEART CATH N/A 11/16/2019   Procedure: RIGHT HEART CATH;  Surgeon: Jolaine Artist, MD;  Location: [facility];  Service: Cardiovascular;  Laterality: N/A;   TUBAL LIGATION  1999     Family History  Problem Relation Age of Onset   Emphysema Maternal Grandmother        smoked   Heart disease Maternal Grandmother 76       MI   Rheum arthritis Mother    Allergies Daughter    Colon cancer Neg Hx      Social History   Socioeconomic History   Marital status: Married    Spouse name: Not on file   Number of children: 1   Years of education: Not on file   Highest education level: Associate degree: occupational, Hotel manager, or vocational program  Occupational History   Occupation: stay at home mom  Tobacco Use   Smoking status: Never   Smokeless tobacco: Never  Vaping Use   Vaping Use: Never used  Substance and Sexual Activity   Alcohol use: No   Drug use: No   Sexual activity: Yes    Birth control/protection: Surgical, Pill    Comment: BTL  Other Topics Concern   Not on file  Social History Narrative   Not on file   Social Determinants of Health   Financial Resource Strain: Low Risk  (08/18/2022)   Overall Financial Resource Strain (CARDIA)    Difficulty of Paying Living Expenses: Not hard at all  Food Insecurity: No Food Insecurity (08/18/2022)   Hunger Vital Sign    Worried About Running Out of Food in the Last Year: Never true    Ran Out of Food  in the Last Year: Never true  Transportation Needs: No Transportation Needs (08/18/2022)    PRAPARE - Hydrologist (Medical): No    Lack of Transportation (Non-Medical): No  Physical Activity: Insufficiently Active (08/18/2022)   Exercise Vital Sign    Days of Exercise per Week: 1 day    Minutes of Exercise per Session: 30 min  Stress: Stress Concern Present (08/18/2022)   [place]    Feeling of Stress : Very much  Social Connections: Moderately Isolated (08/18/2022)   Social Connection and Isolation Panel [NHANES]    Frequency of Communication with Friends and Family: Once a week    Frequency of Social Gatherings with Friends and Family: Never    Attends Religious Services: Never    Marine scientist or Organizations: Yes    Attends Music therapist: More than 4 times per year    Marital Status: Married  Human resources officer Violence: Not on file     BP 124/82   Pulse 93   Ht 5\' 5"  (1.651 m)   Wt 211 lb 6.4 oz (95.9 kg)   SpO2 99%   BMI 35.18 kg/m   Physical Exam:  Well appearing NAD HEENT: Unremarkable Neck:  No JVD, no thyromegally Lymphatics:  No adenopathy Back:  No CVA tenderness Lungs:  Clear HEART:  Regular rate rhythm, no murmurs, no rubs, no clicks Abd:  soft, positive bowel sounds, no organomegally, no rebound, no guarding Ext:  2 plus pulses, no edema, no cyanosis, no clubbing Skin:  No rashes no nodules Neuro:  CN II through XII intact, motor grossly intact  EKG NSR with PVC's, noise artifact  DEVICE  Normal device function.  See PaceArt for details.   Assess/Plan:  PVCs - she is asymptomatic at this time. She will continue amiodarone/mexitil. 2. Chronic systolic heart failure - her symptoms have worsened. She will followup with the CHF clinic.   3. ICD - her [place] ICD is working normally. She has had no therapies and has about 9 years of battery longevity.    Carleene Overlie Laycee Fitzsimmons,MD

## 2022-08-20 NOTE — Patient Instructions (Signed)
Medication Instructions:  Your physician recommends that you continue on your current medications as directed. Please refer to the Current Medication list given to you today.  *If you need a refill on your cardiac medications before your next appointment, please call your pharmacy*  Lab Work: None ordered.  If you have labs (blood work) drawn today and your tests are completely normal, you will receive your results only by: [facility] (if you have MyChart) OR A paper copy in the mail If you have any lab test that is abnormal or we need to change your treatment, we will call you to review the results.  Testing/Procedures: None ordered.  Follow-Up: At [facility], you and your health needs are our priority.  As part of our continuing mission to provide you with exceptional heart care, we have created designated Provider Care Teams.  These Care Teams include your primary Cardiologist (physician) and Advanced Practice Providers (APPs -  Physician Assistants and Nurse Practitioners) who all work together to provide you with the care you need, when you need it.   Your next appointment:   1 year(s)  The format for your next appointment:   In Person  Provider:   Cristopher Peru, MD{or one of the following Advanced Practice Providers on your designated Care Team:   Tommye Standard, [place] Legrand Como "Jonni Sanger" Chalmers Cater, [place]  Remote monitoring is used to monitor your ICD from home. This monitoring reduces the number of office visits required to check your device to one time per year. It allows [place] to keep an eye on the functioning of your device to ensure it is working properly. You are scheduled for a device check from home on 09/17/22. You may send your transmission at any time that day. If you have a wireless device, the transmission will be sent automatically. After your physician reviews your transmission, you will receive a postcard with your next transmission date.

## 2022-08-24 ENCOUNTER — Encounter (HOSPITAL_COMMUNITY): Payer: Self-pay | Admitting: Internal Medicine

## 2022-08-24 ENCOUNTER — Telehealth (HOSPITAL_COMMUNITY): Payer: Self-pay | Admitting: Cardiology

## 2022-08-24 NOTE — Telephone Encounter (Signed)
Patient called to request location change and approval date change for procedure auth -pet scan scheduled 09/16/22 @ [facility] -auth currently only valid for 09/08/22 at [facility]  (original order/pa was placed for [facility] however since then [facility] is able to accommodate procedure)  -per Jasmine NPCR at [facility] facility   Will forward to correct team member to iron out

## 2022-08-25 NOTE — Progress Notes (Signed)
GYNECOLOGY  VISIT   HPI: 46 y.o.   Married  [place]  female   [phone] with No LMP recorded. (Menstrual status: Oral contraceptives).   here for  endometrial biopsy for heavy and irregular menstrual bleeding.  She has multiple fibroids on pelvic US 08/18/22.  Patient is working toward a uterine artery embolization.   She will have a CT scan as she cannot do an MRI because of her defibrillator. She has an appointment with interventional radiology on 09/14/22.   She has chosen to continue her combined oral contraception.   UPT negative today.  GYNECOLOGIC HISTORY: No LMP recorded. (Menstrual status: Oral contraceptives). Contraception:  BTL/COC Menopausal hormone therapy:  n/a Last mammogram:  2019 per pt, WNL Last pap smear:   2019 per pt, WNL, 08/01/10 normal        OB History     Gravida  2   Para  1   Term      Preterm      AB  1   Living  1      SAB      IAB  1   Ectopic      Multiple      Live Births                 Patient Active Problem List   Diagnosis Date Noted   Encounter for general adult medical examination with abnormal findings 05/04/2022   GERD (gastroesophageal reflux disease) 03/10/2022   Right wrist pain 04/11/2021   Morbid obesity 02/28/2021   Greater trochanteric bursitis of right hip 12/26/2020   Congestive heart failure, NYHA class III 11/08/2020   Asthma 08/25/2020   Iron deficiency anemia due to chronic blood loss 08/06/2020   Greater trochanteric bursitis, left 07/01/2020   V-tach 11/12/2019   Headache 10/26/2018   Chronic right-sided low back pain without sciatica 06/24/2018   Patellar subluxation, left, initial encounter 04/25/2018   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   Insomnia 05/07/2017   ICD (implantable cardioverter-defibrillator), single, 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   Patellofemoral syndrome of both knees 10/16/2016   NICM (nonischemic cardiomyopathy) 08/03/2016   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   Labral tear  of shoulder 04/04/2015   Chronic combined systolic and diastolic heart failure 03/05/2014   IBS (irritable bowel syndrome)    MDD (major depressive disorder) 01/20/2013   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   Essential hypertension     Past Medical History:  Diagnosis Date   Acute on chronic systolic (congestive) heart failure 04/29/2017   Acute pain of right shoulder 06/16/2017   AICD (automatic cardioverter/defibrillator) present    Anginal pain    Anxiety    Arthritis    right shoulder    Asthma    CHF (congestive heart failure)    Chronic combined systolic and diastolic heart failure 03/05/2014   Closed low lateral malleolus fracture 10/23/2013   Cystitis 10/21/2017   Depression    Depression with anxiety 01/20/2013   Diabetes mellitus without complication    Diverticulosis    Dyspnea    comes and goes intermittently mostly with exertion    Dysrhythmia    Essential hypertension    Prev followed by [name]    Fibroid    age 17   Gallstones    Generalized abdominal cramping    History of cardiomyopathy    Hypertension    IBS (irritable bowel syndrome)    ICD (implantable cardioverter-defibrillator), single, 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 CPAP intolerance >> "smothering" Good tolerance of auto device (optimal pressure 12-13 on download).  - referred to Dr Shelle Iron     OSA on CPAP    Ovarian cyst    1999; surgically removed   Patellofemoral syndrome of both knees 10/16/2016   Postpartum  cardiomyopathy    developed after 1st pregnancy   PVC (premature ventricular contraction) 06/23/2016   Seizures    "as a child" (08/03/2016)   Termination of pregnancy    due to cardiac risk    Past Surgical History:  Procedure Laterality Date   CARDIAC CATHETERIZATION N/A 11/11/2015   Procedure: Right/Left Heart Cath and Coronary Angiography;  Surgeon: Lennette Bihari, MD;  Location: [facility] INVASIVE CV LAB;  Service: Cardiovascular;  Laterality: N/A;   CARDIAC CATHETERIZATION  ~ 2015   CARDIAC DEFIBRILLATOR PLACEMENT  08/03/2016   CESAREAN SECTION  1999   COLONOSCOPY WITH PROPOFOL N/A 04/21/2016   Procedure: COLONOSCOPY WITH PROPOFOL;  Surgeon: Beverley Fiedler, MD;  Location: [facility] ENDOSCOPY;  Service: Gastroenterology;  Laterality: N/A;   ESOPHAGOGASTRODUODENOSCOPY (EGD) WITH PROPOFOL N/A 04/21/2016   Procedure: ESOPHAGOGASTRODUODENOSCOPY (EGD) WITH PROPOFOL;  Surgeon: Beverley Fiedler, MD;  Location: [facility] ENDOSCOPY;  Service: Gastroenterology;  Laterality: N/A;   FOOT FRACTURE SURGERY Right ~ 2003   FRACTURE SURGERY     ICD IMPLANT N/A 08/03/2016   Procedure: ICD Implant;  Surgeon: Duke Salvia, MD;  Location: [facility] INVASIVE CV LAB;  Service: Cardiovascular;  Laterality: N/A;   LAPAROSCOPIC CHOLECYSTECTOMY  12/2006   LAPAROSCOPIC GASTRIC SLEEVE RESECTION     LAPAROSCOPY ABDOMEN DIAGNOSTIC  2008   "cut bile duct w/gallbladder OR; had to go in later & fix leak; hospitalized for 2 months"   LEFT HEART CATHETERIZATION WITH CORONARY ANGIOGRAM N/A 02/26/2014   Procedure: LEFT HEART CATHETERIZATION WITH CORONARY ANGIOGRAM;  Surgeon: Corky Crafts, MD;  Location: [facility] CATH LAB;  Service: Cardiovascular;  Laterality: N/A;   OVARIAN CYST REMOVAL Right 1999   PVC ABLATION N/A 12/01/2017   Procedure: PVC ABLATION;  Surgeon: Marinus Maw, MD;  Location: [facility] INVASIVE CV LAB;  Service: Cardiovascular;  Laterality: N/A;   RIGHT HEART CATH N/A 08/05/2017   Procedure: RIGHT HEART CATH;  Surgeon: Dolores Patty, MD;  Location: [facility] INVASIVE CV LAB;  Service: Cardiovascular;  Laterality: N/A;   RIGHT HEART CATH N/A 11/16/2019   Procedure: RIGHT HEART CATH;  Surgeon: Dolores Patty, MD;  Location: [facility] INVASIVE CV LAB;  Service: Cardiovascular;  Laterality: N/A;   TUBAL LIGATION  1999    Current Outpatient Medications  Medication Sig Dispense Refill   acetaminophen (TYLENOL) 650 MG CR tablet Take 650 mg by mouth every 8 (eight) hours as needed for pain (headache).     albuterol (VENTOLIN HFA) 108 (90 Base) MCG/ACT inhaler INHALE 1 TO 2 PUFFS BY MOUTH EVERY 6 HOURS AS NEEDED FOR WHEEZING FOR SHORTNESS OF BREATH 18 g 0   amiodarone (PACERONE) 200 MG tablet Take 1 tablet (200 mg total) by mouth daily. 90 tablet 3   budesonide-formoterol (SYMBICORT) 160-4.5  MCG/ACT inhaler Inhale 2 puffs into the lungs 2 (two) times daily as needed. 10 g 3   CORLANOR 7.5 MG TABS tablet TAKE 1 TABLET BY MOUTH TWICE DAILY WITH A MEAL 60 tablet 11   dicyclomine (BENTYL) 20 MG tablet Take 1 tablet (20 mg total) by mouth daily as needed for spasms. 90 tablet 3   empagliflozin (JARDIANCE) 25 MG TABS tablet Take 1 tablet (25 mg total) by mouth daily before breakfast. 90 tablet 3   fluticasone (FLONASE) 50 MCG/ACT nasal spray Place 2 sprays into both nostrils daily as needed for allergies. 16 g 3   gabapentin (NEURONTIN) 300 MG capsule Take 1 capsule (300 mg total) by mouth at bedtime as needed (back spasms). 90 capsule 3   levonorgestrel-ethinyl estradiol (SEASONALE) 0.15-0.03 MG tablet Take 1 tablet by mouth daily. Uncertain the name of the birth control pill.     lidocaine (LIDODERM) 5 % Place 1 patch onto the skin daily. Remove & Discard patch within 12 hours or as directed by MD 90 patch 3   Magnesium Oxide 200 MG TABS Take 1 tablet (200 mg total) by mouth daily. 90 tablet 3   metoprolol succinate (TOPROL-XL) 100 MG 24 hr tablet Take 1 tablet (100 mg total) by mouth daily. Take with or immediately following a meal. 90 tablet 3    mexiletine (MEXITIL) 150 MG capsule Take 2 capsules (300 mg total) by mouth 2 (two) times daily. 120 capsule 11   mirtazapine (REMERON) 15 MG tablet Take 1 tablet (15 mg total) by mouth at bedtime. 30 tablet 3   Multiple Vitamins-Minerals (MULTIVITAMIN GUMMIES WOMENS PO) Take 2 tablets by mouth daily.     nitroGLYCERIN (NITROSTAT) 0.4 MG SL tablet Place 1 tablet (0.4 mg total) under the tongue every 5 (five) minutes as needed for chest pain. 75 tablet 0   potassium chloride SA (KLOR-CON M) 20 MEQ tablet Take 2 tablets (40 mEq total) by mouth daily. 180 tablet 3   sacubitril-valsartan (ENTRESTO) 97-103 MG Take 1 tablet by mouth twice daily 180 tablet 3   spironolactone (ALDACTONE) 25 MG tablet Take 1 tablet (25 mg total) by mouth at bedtime. 90 tablet 3   tizanidine (ZANAFLEX) 2 MG capsule Take 2 mg by mouth as needed for muscle spasms.     torsemide (DEMADEX) 20 MG tablet Take 1 tablet (20 mg total) by mouth daily. Can take extra 20 mg as needed for swelling     Ubrogepant (UBRELVY) 100 MG TABS Take 100 mg by mouth daily as needed (migraine). 30 tablet 3   Vitamin D, Ergocalciferol, (DRISDOL) 1.25 MG (50000 UNIT) CAPS capsule TAKE 1 CAPSULE BY MOUTH EVERY 7 DAYS 12 capsule 0   No current facility-administered medications for this visit.     ALLERGIES: Vancomycin, Aspirin, Contrast media [iodinated contrast media], Ciprofloxacin, Cyclobenzaprine, Farxiga [dapagliflozin], and Sulfa antibiotics  Family History  Problem Relation Age of Onset   Emphysema Maternal Grandmother        smoked   Heart disease Maternal Grandmother 39       MI   Rheum arthritis Mother    Allergies Daughter    Colon cancer Neg Hx     Social History   Socioeconomic History   Marital status: Married    Spouse name: Not on file   Number of children: 1   Years of education: Not on file   Highest education level: Associate degree: occupational, Scientist, product/process development, or vocational program  Occupational History    Occupation: stay  at home mom  Tobacco Use   Smoking status: Never   Smokeless tobacco: Never  Vaping Use   Vaping Use: Never used  Substance and Sexual Activity   Alcohol use: No   Drug use: No   Sexual activity: Yes    Birth control/protection: Surgical, Pill    Comment: BTL  Other Topics Concern   Not on file  Social History Narrative   Not on file   Social Determinants of Health   Financial Resource Strain: Low Risk  (08/18/2022)   Overall Financial Resource Strain (CARDIA)    Difficulty of Paying Living Expenses: Not hard at all  Food Insecurity: No Food Insecurity (08/18/2022)   Hunger Vital Sign    Worried About Running Out of Food in the Last Year: Never true    Ran Out of Food in the Last Year: Never true  Transportation Needs: No Transportation Needs (08/18/2022)   PRAPARE - Administrator, Civil Service (Medical): No    Lack of Transportation (Non-Medical): No  Physical Activity: Insufficiently Active (08/18/2022)   Exercise Vital Sign    Days of Exercise per Week: 1 day    Minutes of Exercise per Session: 30 min  Stress: Stress Concern Present (08/18/2022)   [employer] of Occupational Health - Occupational Stress Questionnaire    Feeling of Stress : Very much  Social Connections: Moderately Isolated (08/18/2022)   Social Connection and Isolation Panel [NHANES]    Frequency of Communication with Friends and Family: Once a week    Frequency of Social Gatherings with Friends and Family: Never    Attends Religious Services: Never    Database administrator or Organizations: Yes    Attends Engineer, structural: More than 4 times per year    Marital Status: Married  Catering manager Violence: Not on file    Review of Systems  All other systems reviewed and are negative.   PHYSICAL EXAMINATION:    BP 124/80 (BP Location: Right Arm, Patient Position: Sitting, Cuff Size: Large)   Pulse 71   Ht  (1.651 m)   Wt 211 lb (95.7 kg)   SpO2  99%   BMI 35.11 kg/m     General appearance: alert, cooperative and appears stated age   EMB: Consent for procedure. Sterile prep with Hibiclens Paracervical block with 1% lidocaine 6 cc, lot number    [id number], expiration 11/2024 Tenaculum to anterior cervical lip. Pipelle passed to   9  cm twice.   Tissue to pathology.  Minimal EBL. No complications.   Chaperone was present for exam:  Warren Lacy, CMA  ASSESSMENT  Abnormal uterine bleeding.  Fibroid uterus.  Status post BTL.  On COCs.  Hx CHF.  Has a defibrillator.  PLAN  Fu EMB.  Proceed with interventional radiology consultation.  She will do a CT scan for her pre-procedure imaging.  She understands I am not comfortable with her taking the combined oral contraception due to risk of thromboembolic events for her.  Fu here prn.

## 2022-08-26 ENCOUNTER — Encounter: Payer: Self-pay | Admitting: Internal Medicine

## 2022-08-26 ENCOUNTER — Encounter (HOSPITAL_COMMUNITY): Payer: Self-pay | Admitting: Internal Medicine

## 2022-08-27 ENCOUNTER — Telehealth: Payer: Self-pay | Admitting: *Deleted

## 2022-08-27 NOTE — Telephone Encounter (Signed)
   PET scan auth approved

## 2022-08-27 NOTE — Telephone Encounter (Addendum)
New auth request faxed to [facility] for cardiac pet (sarcoid) for Elvina Sidle date of service 09/15/22.  Faxed to [phone]

## 2022-08-31 ENCOUNTER — Other Ambulatory Visit (HOSPITAL_COMMUNITY): Payer: Self-pay | Admitting: Internal Medicine

## 2022-08-31 DIAGNOSIS — I5042 Chronic combined systolic (congestive) and diastolic (congestive) heart failure: Secondary | ICD-10-CM

## 2022-09-03 ENCOUNTER — Encounter (HOSPITAL_COMMUNITY): Payer: PPO

## 2022-09-03 NOTE — Progress Notes (Signed)
No ICM remote transmission received for 08/31/2022 and next ICM transmission scheduled for 09/14/2022.   

## 2022-09-07 ENCOUNTER — Other Ambulatory Visit (HOSPITAL_COMMUNITY): Payer: Self-pay | Admitting: Internal Medicine

## 2022-09-07 NOTE — Telephone Encounter (Signed)
I have reviewed the phone message with the update regarding recommendation for the CT scan instead of the MRI.  I will sign the order for the CT scan of the abdomen and pelvis.

## 2022-09-07 NOTE — Telephone Encounter (Signed)
I noticed today upon review that patient was scheduled for CT abd/pelvis with and without contrast on 09/10/2022 instead of the MRI that Dr. Edward Jolly had instructed me to order.  I called [facility] and they transferred me to [facility] at Interventional Radiology.  She said there had been much conversation about this patient. Patient has a Best boy and a Photographer.  This makes her unsafe for MRI imaging except head and extremities.  Interventional Radiology rescheduled the MRI to CT abd pelvis with and without and this will be adequate for their evaluation.  Patient is scheduled with Intervention Radiology on 4.22.2024.  I just wanted to update Dr. Edward Jolly on this finding.

## 2022-09-08 ENCOUNTER — Other Ambulatory Visit (HOSPITAL_COMMUNITY)
Admission: RE | Admit: 2022-09-08 | Discharge: 2022-09-08 | Disposition: A | Discharge status: Home / Self Care | Payer: PPO | Source: Ambulatory Visit | Attending: Obstetrics and Gynecology | Admitting: Obstetrics and Gynecology

## 2022-09-08 ENCOUNTER — Encounter: Payer: Self-pay | Admitting: Obstetrics and Gynecology

## 2022-09-08 ENCOUNTER — Ambulatory Visit: Payer: PPO | Admitting: Obstetrics and Gynecology

## 2022-09-08 VITALS — BP 124/80 | HR 71 | Ht 65.0 in | Wt 211.0 lb

## 2022-09-08 DIAGNOSIS — N939 Abnormal uterine and vaginal bleeding, unspecified: Secondary | ICD-10-CM

## 2022-09-08 DIAGNOSIS — Z01812 Encounter for preprocedural laboratory examination: Secondary | ICD-10-CM

## 2022-09-08 DIAGNOSIS — D219 Benign neoplasm of connective and other soft tissue, unspecified: Secondary | ICD-10-CM

## 2022-09-08 LAB — PREGNANCY, URINE: Preg Test, Ur: NEGATIVE

## 2022-09-08 NOTE — Patient Instructions (Signed)
Uterine Artery Embolization for Fibroids Uterine artery embolization is a procedure to shrink uterine fibroids. Uterine fibroids are abnormal growths of tissue (tumors) that can develop in the uterus and cause heavy menstrual bleeding and pain. This type of tumor is not cancerous (is benign). Fibroids can vary in size, shape, weight, and where they grow in the uterus. In this procedure, a small, thin tube (catheter) is used to inject tiny particle beads that block the blood supply to the fibroid. This causes the fibroid to shrink. Tell a health care provider about: Any allergies you have. All medicines you are taking, including vitamins, herbs, eye drops, creams, and over-the-counter medicines. Any problems you or family members have had with anesthetic medicines. Any blood disorders you have. Any surgeries you have had. Any medical conditions you have. Whether you are pregnant or may be pregnant. What are the risks? Generally, this is a safe procedure. However, problems may occur, including: Bleeding. Allergic reactions to medicines or dyes. Damage to nearby structures or organs. Infection, including blood infection. Injury to the uterus from decreased blood supply. Lack of menstrual periods. Other problems may occur, including: Death of tissue cells around your bladder or vulva. A hole that may develop between organs, or from an organ to the surface of your skin. Blood clot in the legs or lung. What happens before the procedure? Staying hydrated Follow instructions from your health care provider about hydration, which may include: Up to 2 hours before the procedure - you may continue to drink clear liquids, such as water, clear fruit juice, black coffee, and plain tea.  Eating and drinking restrictions Follow instructions from your health care provider about eating and drinking, which may include: 8 hours before the procedure - stop eating heavy meals or foods, such as meat, fried  foods, or fatty foods. 6 hours before the procedure - stop eating light meals or foods, such as toast or cereal. 6 hours before the procedure - stop drinking milk or drinks that contain milk. 2 hours before the procedure - stop drinking clear liquids. Medicines Ask your health care provider about: Changing or stopping your regular medicines. This is especially important if you are taking diabetes medicines or blood thinners. Taking over-the-counter medicines, vitamins, herbs, and supplements. Taking medicines such as aspirin and ibuprofen. These medicines can thin your blood. Do not take these medicines unless your health care provider tells you to take them. You may be given medicine to prevent nausea and vomiting. General instructions Do not use any products that contain nicotine or tobacco for at least 4 weeks before the procedure. These products include cigarettes, chewing tobacco, and vaping devices, such as e-cigarettes. If you need help quitting, ask your health care provider. Ask your health care provider: What steps will be taken to help prevent infection. These steps may include: Removing hair at the surgery site. Washing skin with a germ-killing soap. Taking antibiotic medicine. You will be asked to empty your bladder before the procedure. Plan to have a responsible adult take you home from the hospital or clinic. Plan to have a responsible adult care for you for the time you are told after you leave the hospital or clinic. This is important. What happens during the procedure?  An IV will be inserted into one of your veins. You will be given one or more of the following: A medicine to help you relax (sedative). A medicine to numb the area (local anesthetic). A small cut (incision) will be made in your   groin to find the main artery in your leg. A catheter will be inserted into the main artery in your leg and guided to your uterus. Images and X-rays will be taken while dye is  injected through the catheter. This shows the blood supply to your uterus and fibroids. Tiny particles, about the size of grains of sand, will be injected through the catheter and will lodge in tiny branches of the uterine artery. This will block the blood supply to the fibroids and cause them to shrink. Metal coils may also be used to help block the artery. The procedure will be repeated on the artery supplying blood to the other side of the uterus. The catheter will be removed and a bandage (dressing) will be placed over the incision. The procedure may vary among health care providers and hospitals. What can I expect after the procedure? Your blood pressure, heart rate, breathing rate, and blood oxygen level will be monitored until you leave the hospital or clinic. You will be given pain medicine as needed. You may be given medicine for nausea and vomiting as needed. If you were given a sedative during the procedure, it can affect you for several hours. Do not drive or operate machinery until your health care provider says it is safe. Summary Uterine artery embolization is a procedure to shrink uterine fibroids by blocking their blood supply. You may be given a medicine to help you relax (sedative) and a medicine to numb the area (local anesthetic) for the procedure. Tiny particles will be injected in the artery to your uterus to block blood flow and shrink the fibroid. After the procedure, you will be given medicine for pain and nausea as needed. If you were given a sedative during the procedure, it can affect you for several hours. Do not drive or operate machinery until your health care provider says it is safe. This information is not intended to replace advice given to you by your health care provider. Make sure you discuss any questions you have with your health care provider. Document Revised: 12/18/2019 Document Reviewed: 12/18/2019 Elsevier Patient Education  2023 Elsevier Inc.  

## 2022-09-10 ENCOUNTER — Ambulatory Visit
Admission: RE | Admit: 2022-09-10 | Discharge: 2022-09-10 | Disposition: A | Discharge status: Home / Self Care | Payer: PPO | Source: Ambulatory Visit | Attending: Obstetrics and Gynecology | Admitting: Obstetrics and Gynecology

## 2022-09-10 DIAGNOSIS — D259 Leiomyoma of uterus, unspecified: Secondary | ICD-10-CM | POA: Diagnosis not present

## 2022-09-10 DIAGNOSIS — D219 Benign neoplasm of connective and other soft tissue, unspecified: Secondary | ICD-10-CM

## 2022-09-10 LAB — SURGICAL PATHOLOGY

## 2022-09-10 MED ORDER — IOPAMIDOL (ISOVUE-370) INJECTION 76%
75.0000 mL | Freq: Once | INTRAVENOUS | Status: AC | PRN
  Administered 2022-09-10: 75 mL via INTRAVENOUS

## 2022-09-11 ENCOUNTER — Telehealth (HOSPITAL_COMMUNITY): Payer: Self-pay | Admitting: *Deleted

## 2022-09-11 NOTE — Telephone Encounter (Signed)
Attempted to call patient regarding upcoming cardiac PET appointment. Left message on voicemail with name and callback number  Jeslie Lowe RN Navigator Cardiac Imaging [facility] [phone] Office [phone] Cell  

## 2022-09-14 ENCOUNTER — Other Ambulatory Visit: Payer: PPO

## 2022-09-14 ENCOUNTER — Ambulatory Visit: Payer: PPO | Attending: Internal Medicine

## 2022-09-14 DIAGNOSIS — I5042 Chronic combined systolic (congestive) and diastolic (congestive) heart failure: Secondary | ICD-10-CM | POA: Diagnosis not present

## 2022-09-14 DIAGNOSIS — Z95810 Presence of automatic (implantable) cardiac defibrillator: Secondary | ICD-10-CM

## 2022-09-15 ENCOUNTER — Encounter (HOSPITAL_COMMUNITY): Payer: Self-pay

## 2022-09-15 ENCOUNTER — Encounter (HOSPITAL_COMMUNITY)
Admission: RE | Admit: 2022-09-15 | Discharge: 2022-09-15 | Disposition: A | Discharge status: Home / Self Care | Payer: PPO | Source: Ambulatory Visit | Attending: Internal Medicine | Admitting: Internal Medicine

## 2022-09-15 DIAGNOSIS — I5042 Chronic combined systolic (congestive) and diastolic (congestive) heart failure: Secondary | ICD-10-CM | POA: Insufficient documentation

## 2022-09-15 NOTE — Progress Notes (Signed)
EPIC Encounter for ICM Monitoring  Patient Name: [name] is a 47 y.o. female Date: 09/15/2022 Primary Care Physican: Myrlene Broker, MD Primary Cardiologist: Bensimhon Electrophysiologist: Graciela Husbands 11/19/2021 Weight: 214 lbs      04/08/2022 Weight: 190-194 lbs      05/13/2022 Weight: 193 lbs     09/15/2022 Weight: 206 lbs   (baseline closer to 198 lbs)                             Spoke with patient and HF questions reviewed.  She reports swelling of ankles and feet, cough at night, SOB going up stairs and daily activities, weight gain of  6-7 lbs in past 2 weeks.   Heartlogic HF Index 48 suggesting possible fluid accumulation starting 3/22.    Barostim implant 6/27.   Prescribed: Torsemide 20 mg Take 1 tablet  (20 mg total) by mouth daily. Can take extra 20 mg as needed. Potassium 20 mEq take 2 tablet(s) by mouth daily Spironolactone 25 mg take 1 tablet (25 mg total) at bedtime.    Labs: 07/30/2022 Creatinine 0.85, BUN 6,   Potassium 3.6, Sodium 136, GFR >60 07/03/2022 Creatinine 0.93, BUN 5,   Potassium 3.6, Sodium 136, GFR 76 06/03/2022 Creatinine 0.87, BUN 8,   Potassium 3.8, Sodium 135, GFR >60, BNP 710.3 05/06/2022 Creatinine 1.30, BUN 8,   Potassium 3.2, Sodium 137, GFR 51,   BNP 251.3 05/04/2022 Creatinine 1.07, BUN 9,   Potassium 3.9, Sodium 139  A complete set of results can be found in Results Review.   Recommendations:  Advised to take Torsemide 40 mg daily x 3-4 days and then return to prescribed dosage.    Follow-up plan: ICM clinic phone appointment on 09/22/2022 to recheck fluid levels.  91 day device clinic remote transmission 12/17/2022.              EP/Cardiology next office visit:   10/01/2022 with Dr Ladona Ridgel.  Planning on scheduling with Dr Gala Romney            Copy of ICM check sent to Dr. Graciela Husbands and Dr Gala Romney as Lorain Childes and review.  3 Month HeartLogicT Heart Failure Index:    8 Day Data Trend:          Karie Soda, RN 09/15/2022 12:34 PM

## 2022-09-16 ENCOUNTER — Encounter (HOSPITAL_COMMUNITY): Payer: Self-pay | Admitting: Internal Medicine

## 2022-09-17 ENCOUNTER — Ambulatory Visit
Admission: RE | Admit: 2022-09-17 | Discharge: 2022-09-17 | Disposition: A | Discharge status: Home / Self Care | Payer: PPO | Source: Ambulatory Visit

## 2022-09-17 DIAGNOSIS — Z1231 Encounter for screening mammogram for malignant neoplasm of breast: Secondary | ICD-10-CM | POA: Diagnosis not present

## 2022-09-18 ENCOUNTER — Other Ambulatory Visit (HOSPITAL_COMMUNITY): Payer: Self-pay

## 2022-09-22 ENCOUNTER — Other Ambulatory Visit: Payer: Self-pay | Admitting: Internal Medicine

## 2022-09-22 ENCOUNTER — Ambulatory Visit
Admission: RE | Admit: 2022-09-22 | Discharge: 2022-09-22 | Disposition: A | Discharge status: Home / Self Care | Payer: PPO | Source: Ambulatory Visit | Attending: Obstetrics and Gynecology | Admitting: Obstetrics and Gynecology

## 2022-09-22 ENCOUNTER — Ambulatory Visit: Payer: PPO | Attending: Internal Medicine

## 2022-09-22 ENCOUNTER — Telehealth: Payer: Self-pay

## 2022-09-22 DIAGNOSIS — D219 Benign neoplasm of connective and other soft tissue, unspecified: Secondary | ICD-10-CM | POA: Diagnosis not present

## 2022-09-22 DIAGNOSIS — R928 Other abnormal and inconclusive findings on diagnostic imaging of breast: Secondary | ICD-10-CM

## 2022-09-22 DIAGNOSIS — I5042 Chronic combined systolic (congestive) and diastolic (congestive) heart failure: Secondary | ICD-10-CM

## 2022-09-22 DIAGNOSIS — Z95810 Presence of automatic (implantable) cardiac defibrillator: Secondary | ICD-10-CM

## 2022-09-22 NOTE — Progress Notes (Signed)
EPIC Encounter for ICM Monitoring  Patient Name: [name] is a 47 y.o. female Date: 09/22/2022 Primary Care Physican: Myrlene Broker, MD Primary Cardiologist: Bensimhon Electrophysiologist: Graciela Husbands 11/19/2021 Weight: 214 lbs      04/08/2022 Weight: 190-194 lbs      05/13/2022 Weight: 193 lbs     09/15/2022 Weight: 206 lbs   (baseline closer to 198 lbs)                             Attempted call to patient and unable to reach.  Left detailed message per DPR regarding transmission. Transmission reviewed.    Heartlogic HF Index increased from 48 to 50 suggesting possible fluid accumulation starting 3/22.    Barostim implant 6/27.   Prescribed: Torsemide 20 mg Take 1 tablet  (20 mg total) by mouth daily. Can take extra 20 mg as needed. Potassium 20 mEq take 2 tablet(s) by mouth daily Spironolactone 25 mg take 1 tablet (25 mg total) at bedtime.    Labs: 07/30/2022 Creatinine 0.85, BUN 6,   Potassium 3.6, Sodium 136, GFR >60 07/03/2022 Creatinine 0.93, BUN 5,   Potassium 3.6, Sodium 136, GFR 76 06/03/2022 Creatinine 0.87, BUN 8,   Potassium 3.8, Sodium 135, GFR >60, BNP 710.3 05/06/2022 Creatinine 1.30, BUN 8,   Potassium 3.2, Sodium 137, GFR 51,   BNP 251.3 05/04/2022 Creatinine 1.07, BUN 9,   Potassium 3.9, Sodium 139  A complete set of results can be found in Results Review.   Recommendations:  Unable to reach.     Follow-up plan: ICM clinic phone appointment on 09/28/2022 to recheck fluid levels.  91 day device clinic remote transmission 12/17/2022.              EP/Cardiology next office visit:   10/01/2022 with Dr Ladona Ridgel.  Planning on scheduling with Dr Gala Romney            Copy of ICM check sent to Dr. Graciela Husbands.  Will send copy to Dr Gala Romney for review and recommendations if patient is reached.  3 Month HeartLogicT Heart Failure Index:       Karie Soda, RN 09/22/2022 8:01 AM

## 2022-09-22 NOTE — Telephone Encounter (Signed)
Remote ICM transmission received.  Attempted call to patient regarding ICM remote transmission and left detailed message per DPR and to return call.    

## 2022-09-22 NOTE — Consult Note (Signed)
Chief Complaint:  Uterine fibroids, abnormal uterine bleeding  Referring Physician(s): Amundson C Silva,Brook E  History of Present Illness: [name] is a 47 y.o. female Who is a G2 P1 A1.  No future pregnancy plans.  No menopausal symptoms.  Review of her menstrual cycle performed.  Last menstrual cycle was November 2023.  She currently is on birth control pills.  Prior to the birth control pills, her cycle could last up to 15 days with several heavy days requiring both pads and tampons for protection.  Changing frequency was every hour to 3 3 hours.  This has progressed over the last few years.  Workup finally diagnosed uterine fibroids earlier this year.  Heavy periods have resulted in iron deficiency anemia.  She has had approximately 8 iron infusions over the last 2 years.  Dominant symptoms are the heavy menstrual flow.  Patches of blood clots low energy and bloating are some of her additional symptoms.  She continues on birth control at this point to control the bleeding.  No previous fibroid surgeries.  No recent GYN infection.  Previous Pap smear 05/11/2022 was negative.  Endometrial biopsy 09/08/2022 was also unremarkable.  Office ultrasound and contrast-enhanced CT scan confirm uterine fibroids.  Patient cannot have a MRI because of her AICD and cardiac stimulator.  Past Medical History:  Diagnosis Date   Acute on chronic systolic (congestive) heart failure (HCC) 04/29/2017   Acute pain of right shoulder 06/16/2017   AICD (automatic cardioverter/defibrillator) present    Anginal pain (HCC)    Anxiety    Arthritis    right shoulder    Asthma    CHF (congestive heart failure) (HCC)    Chronic combined systolic and diastolic heart failure (HCC) 03/05/2014   Closed low lateral malleolus fracture 10/23/2013   Cystitis 10/21/2017   Depression    Depression with anxiety 01/20/2013   Diabetes mellitus without complication (HCC)    Diverticulosis    Dyspnea    comes and  goes intermittently mostly with exertion    Dysrhythmia    Essential hypertension    Prev followed by [name]    Fibroid    age 41   Gallstones    Generalized abdominal cramping    History of cardiomyopathy    Hypertension    IBS (irritable bowel syndrome)    ICD (implantable cardioverter-defibrillator), single, 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 CPAP intolerance >> "smothering" Good tolerance of auto device (optimal pressure 12-13 on download).  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   PVC (premature ventricular contraction) 06/23/2016   Seizures (HCC)    "as a child" (08/03/2016)   Termination of pregnancy    due to cardiac risk    Past Surgical History:  Procedure Laterality Date   CARDIAC CATHETERIZATION N/A 11/11/2015  Procedure: Right/Left Heart Cath and Coronary Angiography;  Surgeon: Lennette Bihari, MD;  Location: [facility] INVASIVE CV LAB;  Service: Cardiovascular;  Laterality: N/A;   CARDIAC CATHETERIZATION  ~ 2015   CARDIAC DEFIBRILLATOR PLACEMENT  08/03/2016   CESAREAN SECTION  1999   COLONOSCOPY WITH PROPOFOL N/A 04/21/2016   Procedure: COLONOSCOPY WITH PROPOFOL;  Surgeon: Beverley Fiedler, MD;  Location: [facility];  Service: Gastroenterology;  Laterality: N/A;   ESOPHAGOGASTRODUODENOSCOPY (EGD) WITH PROPOFOL N/A 04/21/2016   Procedure:  ESOPHAGOGASTRODUODENOSCOPY (EGD) WITH PROPOFOL;  Surgeon: Beverley Fiedler, MD;  Location: [facility]OS[facility];  Service: Gastroenterology;  Laterality: N/A;   FOOT FRACTURE SURGERY Right ~ 2003   FRACTURE SURGERY     ICD IMPLANT N/A 08/03/2016   Procedure: ICD Implant;  Surgeon: Duke Salvia, MD;  Location: [facility] INVASIVE CV LAB;  Service: Cardiovascular;  Laterality: N/A;   LAPAROSCOPIC CHOLECYSTECTOMY  12/2006   LAPAROSCOPIC GASTRIC SLEEVE RESECTION     LAPAROSCOPY ABDOMEN DIAGNOSTIC  2008   "cut bile duct w/gallbladder OR; had to go in later & fix leak; hospitalized for 2 months"   LEFT HEART CATHETERIZATION WITH CORONARY ANGIOGRAM N/A 02/26/2014   Procedure: LEFT HEART CATHETERIZATION WITH CORONARY ANGIOGRAM;  Surgeon: Corky Crafts, MD;  Location: [facility] CATH LAB;  Service: Cardiovascular;  Laterality: N/A;   OVARIAN CYST REMOVAL Right 1999   PVC ABLATION N/A 12/01/2017   Procedure: PVC ABLATION;  Surgeon: Marinus Maw, MD;  Location: [facility] INVAS[facility] CV LAB;  Service: Cardiovascular;  Laterality: N/A;   RIGHT HEART CATH N/A 08/05/2017   Procedure: RIGHT HEART CATH;  Surgeon: Dolores Patty, MD;  Location: [facility] CV [facility];  Service: Cardiovascular;  Laterality: N/A;   RIGHT HEART CATH N/A 11/16/2019   Procedure: RIGHT HEART CATH;  Surgeon: Dolores Patty, MD;  Location: [facility] CV [facility];  Service: Cardiovascular;  Laterality: N/A;   TUBAL LIGATION  1999    Allergies: Vancomycin, Aspirin, Contrast media [iodinated contrast media], Ciprofloxacin, Cyclobenzaprine, Farxiga [dapagliflozin], and Sulfa antibiotics  Medications: Prior to Admission medications   Medication Sig Start Date End Date Taking? Authorizing Provider  acetaminophen (TYLENOL) 650 MG CR tablet Take 650 mg by mouth every 8 (eight) hours as needed for pain (headache).    [provider]  albuterol (VENTOLIN HFA) 108 (90 Base) MCG/ACT inhaler INHALE 1 TO 2 PUFFS BY MOUTH EVERY 6 HOURS AS NEEDED FOR WHEEZING FOR  SHORTNESS OF BREATH 06/08/22   Myrlene Broker, MD  amiodarone (PACERONE) 200 MG tablet Take 1 tablet (200 mg total) by mouth daily. 02/04/21   Graciella Freer, PA-C  budesonide-formoterol (SYMBICORT) 160-4.5 MCG/ACT inhaler Inhale 2 puffs into the lungs 2 (two) times daily as needed. 03/13/22   Myrlene Broker, MD  CORLANOR 7.5 MG TABS tablet TAKE 1 TABLET BY MOUTH TWICE DAILY WITH A MEAL 02/16/22   Bensimhon, Bevelyn Buckles, MD  dicyclomine (BENTYL) 20 MG tablet Take 1 tablet (20 mg total) by mouth daily as needed for spasms. 05/30/20   Myrlene Broker, MD  empagliflozin (JARDIANCE) 25 MG TABS tablet Take 1 tablet (25 mg total) by mouth daily before breakfast. 03/10/22   Myrlene Broker, MD  fluticasone [facility]) 50 MCG/ACT nasal spray Place 2 sprays into both nostrils daily as needed for allergies. 03/13/22   Myrlene Broker, MD  gabapentin (NEURONTIN) 300 MG capsule Take 1 capsule (300 mg total) by mouth at bedtime as needed (back spasms). 05/30/20   Myrlene Broker, MD  levonorgestrel-ethinyl estradiol (SEASONALE) 0.15-0.03 MG tablet Take 1 tablet by mouth daily. Uncertain the name of the birth control pill.    [provider]  lidocaine (LIDODERM) 5 % Place 1 patch onto the skin daily. Remove & Discard patch within 12 hours or as directed by MD 01/05/22   Myrlene Broker, MD  Magnesium Oxide 200 MG TABS Take 1 tablet (200 mg total) by mouth daily. 05/30/20   Bensimhon, Bevelyn Buckles, MD  metoprolol succinate (TOPROL-XL) 100 MG 24 hr tablet Take 1 tablet (100 mg total) by mouth daily. Take with or immediately following a meal. 07/31/22   Bensimhon, Bevelyn Buckles, MD  mexiletine (MEXITIL) 150 MG capsule Take 2 capsules (300 mg total) by mouth 2 (two) times daily. 06/03/22   Milford, Anderson Malta, FNP  mirtazapine (REMERON) 15 MG tablet Take 1 tablet (15 mg total) by mouth at bedtime. 08/18/22   Myrlene Broker, MD  Multiple Vitamins-Minerals (MULTIVITAMIN GUMMIES  WOMENS PO) Take 2 tablets by mouth daily.    [provider]  nitroGLYCERIN (NITROSTAT) 0.4 MG SL tablet Place 1 tablet (0.4 mg total) under the tongue every 5 (five) minutes as needed for chest pain. 06/20/21   Bensimhon, Bevelyn Buckles, MD  potassium chloride SA (KLOR-CON M) 20 MEQ tablet Take 2 tablets (40 mEq total) by mouth daily. 07/21/22   Bensimhon, Bevelyn Buckles, MD  sacubitril-valsartan (ENTRESTO) 97-103 MG Take 1 tablet by mouth twice daily 08/31/22   Bensimhon, Bevelyn Buckles, MD  spironolactone (ALDACTONE) 25 MG tablet Take 1 tablet (25 mg total) by mouth at bedtime. 01/14/21   Bensimhon, Bevelyn Buckles, MD  tizanidine (ZANAFLEX) 2 MG capsule Take 2 mg by mouth as needed for muscle spasms.    [provider]  torsemide (DEMADEX) 20 MG tablet Take 1 tablet (20 mg total) by mouth daily. Can take extra 20 mg as needed for swelling 06/08/22   Milford, Garland, FNP  Ubrogepant (UBRELVY) 100 MG TABS Take 100 mg by mouth daily as needed (migraine). 06/11/21   Myrlene Broker, MD  Vitamin D, Ergocalciferol, (DRISDOL) 1.25 MG (50000 UNIT) CAPS capsule TAKE 1 CAPSULE BY MOUTH EVERY 7 DAYS 06/02/22   Myrlene Broker, MD     Family History  Problem Relation Age of Onset   Emphysema Maternal Grandmother        smoked   Heart disease Maternal Grandmother 6       MI   Rheum arthritis Mother    Allergies Daughter    Colon cancer Neg Hx     Social History   Socioeconomic History   Marital status: Married    Spouse name: Not on file   Number of children: 1   Years of education: Not on file   Highest education level: Associate degree: occupational, Scientist, product/process development, or vocational program  Occupational History   Occupation: stay at home mom  Tobacco Use   Smoking status: Never   Smokeless tobacco: Never  Vaping Use   Vaping Use: Never used  Substance and Sexual Activity   Alcohol use: No   Drug use: No   Sexual activity: Yes    Birth control/protection: Surgical, Pill    Comment: BTL   Other Topics Concern   Not on file  Social History Narrative   Not on file   Social Determinants of Health   Financial Resource Strain: Low Risk  (08/18/2022)   Overall Financial Resource Strain (CARDIA)    Difficulty of Paying Living Expenses: Not hard at  all  Food Insecurity: No Food Insecurity (08/18/2022)   Hunger Vital Sign    Worried About Running Out of Food in the Last Year: Never true    Ran Out of Food in the Last Year: Never true  Transportation Needs: No Transportation Needs (08/18/2022)   PRAPARE - Administrator, Civil Service (Medical): No    Lack of Transportation (Non-Medical): No  Physical Activity: Insufficiently Active (08/18/2022)   Exercise Vital Sign    Days of Exercise per Week: 1 day    Minutes of Exercise per Session: 30 min  Stress: Stress Concern Present (08/18/2022)   [employer] of Occupational Health - Occupational Stress Questionnaire    Feeling of Stress : Very much  Social Connections: Moderately Isolated (08/18/2022)   Social Connection and Isolation Panel [NHANES]    Frequency of Communication with Friends and Family: Once a week    Frequency of Social Gatherings with Friends and Family: Never    Attends Religious Services: Never    Database administrator or Organizations: Yes    Attends Engineer, structural: More than 4 times per year    Marital Status: Married       Review of Systems: A 12 point ROS discussed and pertinent positives are indicated in the HPI above.  All other systems are negative.  Review of Systems  Vital Signs: BP (!) 138/93   Pulse (!) 109   Temp 98.4 F (36.9 C)   Resp 16   SpO2 94%     Physical Exam   Exam deferred today until the day of the uterine fibroid embolization.     Imaging: MM 3D SCREENING MAMMOGRAM BILATERAL BREAST  Result Date: 09/21/2022 CLINICAL DATA:  Screening. Baseline. EXAM: DIGITAL SCREENING BILATERAL MAMMOGRAM WITH TOMOSYNTHESIS AND CAD TECHNIQUE: Bilateral  screening digital craniocaudal and mediolateral oblique mammograms were obtained. Bilateral screening digital breast tomosynthesis was performed. The images were evaluated with computer-aided detection. COMPARISON:  None available. ACR Breast Density Category c: The breasts are heterogeneously dense, which may obscure small masses. FINDINGS: In the right breast, a possible mass warrants further evaluation. This possible mass is seen within the slightly outer RIGHT breast, cc slice 48. In the left breast, no findings suspicious for malignancy. IMPRESSION: Further evaluation is suggested for possible mass in the right breast. RECOMMENDATION: Ultrasound of the right breast. (Code:US-R-32M) The patient will be contacted regarding the findings, and additional imaging will be scheduled. BI-RADS CATEGORY  0: Incomplete: Need additional imaging evaluation. Electronically Signed   By: Bary Richard M.D.   On: 09/21/2022 11:03   CT ANGIO PELVIS W OR WO CONTRAST  Result Date: 09/13/2022 CLINICAL DATA:  47 year old female with history of uterine fibroids. EXAM: CT ANGIOGRAPHY OF PELVIS TECHNIQUE: Multidetector CT imaging of the pelvis was performed using the standard protocol during bolus administration of intravenous contrast. Multiplanar reconstructed images and MIPs were obtained and reviewed to evaluate the vascular anatomy. RADIATION DOSE REDUCTION: This exam was performed according to the departmental dose-optimization program which includes automated exposure control, adjustment of the mA and/or kV according to patient size and/or use of iterative reconstruction technique. CONTRAST:  75mL ISOVUE-370 IOPAMIDOL (ISOVUE-370) INJECTION 76% COMPARISON:  None Available. FINDINGS: VASCULAR Aorta: The visualized distal aorta is normal in caliber and patent. Inflow: The bilateral common, internal, and external iliac arteries are patent and normal in caliber. There are prominent bilateral uterine arteries arising from the  anterior division of the internal iliac arteries. Proximal Outflow: Bilateral common  femoral and visualized portions of the superficial and profunda femoral arteries are patent without evidence of aneurysm, dissection, vasculitis or significant stenosis. Veins: No evidence of iliocaval anomaly or thrombosis. Review of the MIP images confirms the above findings. NON-VASCULAR Adrenals/Urinary Tract: The visualized lower poles of the kidneys are within normal limits. No evidence of hydronephrosis or hydroureter. The bladder is decompressed and unremarkable. Stomach/Bowel: Appendix appears normal. No evidence of bowel wall thickening, distention, or inflammatory changes. Lymphatic: No pelvic lymphadenopathy. Reproductive: The uterus is enlarged with multifocal enhancing rounded masses which appear within the myometrium, the largest in the anterior lower uterine body measuring up to 4.5 cm compatible with uterine fibroids. Multiple fibroids are partially exophytic. No evidence of pedunculated in. This modality (CT) is a poor evaluation of the endometrium, however no gross abnormalities. No adnexal masses. Other: No evidence of hernia. No abdominopelvic ascites. Musculoskeletal: No acute or significant osseous findings. IMPRESSION: VASCULAR Prominent bilateral uterine arteries arising from the anterior divisions internal iliac arteries. NON-VASCULAR Leiomyomatous uterus. Marliss Coots, MD [facility] Electronically Signed   By: Marliss Coots M.D.   On: 09/13/2022 05:42    Labs:  CBC: Recent Labs    12/13/21 1639 12/14/21 0438 12/15/21 0331 12/16/21 0311 04/28/22 0305  WBC 5.7  --  5.4 2.7* 7.1  HGB 12.6 12.6 13.4 12.6 11.3*  HCT 40.1 37.0 41.9 39.3 35.5*  PLT 163  --  159 174 248    COAGS: Recent Labs    12/14/21 0350  INR 1.0    BMP: Recent Labs    04/28/22 0305 05/04/22 1141 05/06/22 1111 06/03/22 1628 07/03/22 1127 07/30/22 1433   NA 138   < > 137 135 136 136  K 3.5   < > 3.2* 3.8 3.6 3.6  CL 107   < > 100 104 101 106  CO2 24   < > 26 24 25 22   GLUCOSE 94   < > 95 87 88 99  BUN 10   < > 8 8 5* 6  CALCIUM 8.8*   < > 9.1 8.8* 8.6* 8.5*  CREATININE 0.95   < > 1.30* 0.87 0.93 0.85  GFRNONAA >60  --  51* >60  --  >60   < > = values in this interval not displayed.    LIVER FUNCTION TESTS: Recent Labs    12/13/21 1639 12/15/21 0331 12/16/21 0311  BILITOT 0.3 0.4 0.7  AST 24 30 38  ALT 19 23 24   ALKPHOS 57 56 57  PROT 7.2 7.0 6.5  ALBUMIN 3.6 3.3* 3.2*       Assessment and Plan:  47 year old female with symptomatic uterine fibroid with abnormal uterine bleeding and iron deficiency anemia.Complicated cardiac history with AICD as well as a carotid artery stimulator device.  Treatment options were reviewed including hysterectomy versus fibroid embolization and also waiting till menopause.  She is not currently having any menopausal symptoms.  The embolization procedure was discussed in detail including the procedure, risk, benefits and alternatives.  The expected goals, recovery, outcome, and follow-up were also reviewed.  All questions addressed.  After discussion she would like to proceed with scheduling the procedure.  She Has already had a pelvis CTA demonstrating enlarged multifocal fibroid uterus.  Overall fibroid anatomy appears amenable to embolization.  Uterine arteries are visualized and patent bilaterally.  At this point, there is no further workup necessary, she can be scheduled electively.  Because of the prolonged heavy bleeding she can  remain on the birth control pills for the procedure.  Thank you for this interesting consult.  I greatly enjoyed meeting NIKIA LEVELS and look forward to participating in their care.  A copy of this report was sent to the requesting provider on this date.  Electronically Signed: Berdine Dance 09/22/2022, 2:46 PM   I spent a total of  40 Minutes   in face to  face in clinical consultation, greater than 50% of which was counseling/coordinating care for This patient with abnormal uterine bleeding and fibroids.

## 2022-09-23 ENCOUNTER — Ambulatory Visit (INDEPENDENT_AMBULATORY_CARE_PROVIDER_SITE_OTHER): Payer: PPO

## 2022-09-23 ENCOUNTER — Other Ambulatory Visit: Payer: PPO

## 2022-09-23 DIAGNOSIS — I428 Other cardiomyopathies: Secondary | ICD-10-CM

## 2022-09-24 ENCOUNTER — Ambulatory Visit
Admission: RE | Admit: 2022-09-24 | Discharge: 2022-09-24 | Disposition: A | Discharge status: Home / Self Care | Payer: PPO | Source: Ambulatory Visit | Attending: Internal Medicine | Admitting: Internal Medicine

## 2022-09-24 ENCOUNTER — Other Ambulatory Visit: Payer: Self-pay | Admitting: Internal Medicine

## 2022-09-24 DIAGNOSIS — N6310 Unspecified lump in the right breast, unspecified quadrant: Secondary | ICD-10-CM

## 2022-09-24 DIAGNOSIS — N63 Unspecified lump in unspecified breast: Secondary | ICD-10-CM | POA: Diagnosis not present

## 2022-09-24 DIAGNOSIS — R928 Other abnormal and inconclusive findings on diagnostic imaging of breast: Secondary | ICD-10-CM

## 2022-09-24 LAB — CUP PACEART REMOTE DEVICE CHECK
Battery Remaining Longevity: 102 mo
Battery Remaining Percentage: 71 %
Brady Statistic RV Percent Paced: 0 %
Date Time Interrogation Session: 20240501172100
HighPow Impedance: 78 Ohm
Implantable Lead Connection Status: 753985
Implantable Lead Implant Date: 20180312
Implantable Lead Location: 753860
Implantable Lead Model: 293
Implantable Lead Serial Number: 422842
Implantable Pulse Generator Implant Date: 20180312
Lead Channel Impedance Value: 591 Ohm
Lead Channel Setting Pacing Amplitude: 2.5 V
Lead Channel Setting Pacing Pulse Width: 0.4 ms
Lead Channel Setting Sensing Sensitivity: 0.5 mV
Pulse Gen Serial Number: 226601
Zone Setting Status: 755011

## 2022-09-25 ENCOUNTER — Other Ambulatory Visit (HOSPITAL_COMMUNITY): Payer: Self-pay | Admitting: Interventional Radiology

## 2022-09-25 DIAGNOSIS — D219 Benign neoplasm of connective and other soft tissue, unspecified: Secondary | ICD-10-CM

## 2022-09-25 DIAGNOSIS — R791 Abnormal coagulation profile: Secondary | ICD-10-CM

## 2022-09-28 ENCOUNTER — Ambulatory Visit: Payer: PPO | Attending: Internal Medicine

## 2022-09-28 ENCOUNTER — Telehealth: Payer: Self-pay

## 2022-09-28 DIAGNOSIS — I5042 Chronic combined systolic (congestive) and diastolic (congestive) heart failure: Secondary | ICD-10-CM

## 2022-09-28 DIAGNOSIS — Z95810 Presence of automatic (implantable) cardiac defibrillator: Secondary | ICD-10-CM

## 2022-09-28 NOTE — Telephone Encounter (Signed)
Remote ICM transmission received.  Attempted call to patient regarding ICM remote transmission and left detailed message per DPR.  Advised to return call for any fluid symptoms or questions. Next ICM remote transmission scheduled 10/05/2022.    

## 2022-09-28 NOTE — Progress Notes (Signed)
EPIC Encounter for ICM Monitoring  Patient Name: ANAIKA WIGGERS is a 47 y.o. female Date: 09/28/2022 Primary Care Physican: Myrlene Broker, MD Primary Cardiologist: Bensimhon Electrophysiologist: Graciela Husbands 11/19/2021 Weight: 214 lbs      04/08/2022 Weight: 190-194 lbs      05/13/2022 Weight: 193 lbs     09/15/2022 Weight: 206 lbs   (baseline closer to 198 lbs)                             Attempted call to patient and unable to reach.  Left detailed message per DPR regarding transmission. Transmission reviewed.    Heartlogic HF Index decreased from 51 to 35 suggesting fluid levels may be trending back to normal but possible fluid accumulation remains starting 3/22.   Thoracic impedance trending up.  Barostim implant 6/27.   Prescribed: Torsemide 20 mg Take 1 tablet  (20 mg total) by mouth daily. Can take extra 20 mg as needed. Potassium 20 mEq take 2 tablet(s) by mouth daily Spironolactone 25 mg take 1 tablet (25 mg total) at bedtime.    Labs: 07/30/2022 Creatinine 0.85, BUN 6,   Potassium 3.6, Sodium 136, GFR >60 07/03/2022 Creatinine 0.93, BUN 5,   Potassium 3.6, Sodium 136, GFR 76 06/03/2022 Creatinine 0.87, BUN 8,   Potassium 3.8, Sodium 135, GFR >60, BNP 710.3 05/06/2022 Creatinine 1.30, BUN 8,   Potassium 3.2, Sodium 137, GFR 51,   BNP 251.3 05/04/2022 Creatinine 1.07, BUN 9,   Potassium 3.9, Sodium 139  A complete set of results can be found in Results Review.   Recommendations:  Unable to reach.     Follow-up plan: ICM clinic phone appointment on 10/05/2022 to recheck fluid levels.  91 day device clinic remote transmission 12/17/2022.              EP/Cardiology next office visit:   10/01/2022 with Dr Ladona Ridgel.  Planning on scheduling with Dr Gala Romney            Copy of ICM check sent to Dr. Graciela Husbands.  Will send copy to Dr Gala Romney for review and recommendations if patient is reached.  3 Month HeartLogicT Heart Failure Index:    8 Day Data Trend:          Karie Soda,  RN 09/28/2022 4:27 PM

## 2022-09-29 ENCOUNTER — Telehealth: Payer: PPO | Admitting: Internal Medicine

## 2022-09-29 ENCOUNTER — Encounter: Payer: Self-pay | Admitting: Obstetrics and Gynecology

## 2022-09-30 ENCOUNTER — Ambulatory Visit
Admission: RE | Admit: 2022-09-30 | Discharge: 2022-09-30 | Disposition: A | Discharge status: Home / Self Care | Payer: PPO | Source: Ambulatory Visit | Attending: Internal Medicine | Admitting: Internal Medicine

## 2022-09-30 DIAGNOSIS — N6310 Unspecified lump in the right breast, unspecified quadrant: Secondary | ICD-10-CM

## 2022-09-30 DIAGNOSIS — N6021 Fibroadenosis of right breast: Secondary | ICD-10-CM | POA: Diagnosis not present

## 2022-09-30 DIAGNOSIS — R928 Other abnormal and inconclusive findings on diagnostic imaging of breast: Secondary | ICD-10-CM

## 2022-09-30 DIAGNOSIS — N63 Unspecified lump in unspecified breast: Secondary | ICD-10-CM | POA: Diagnosis not present

## 2022-09-30 HISTORY — PX: BREAST BIOPSY: SHX20

## 2022-10-01 ENCOUNTER — Ambulatory Visit: Payer: PPO | Admitting: Internal Medicine

## 2022-10-05 ENCOUNTER — Ambulatory Visit (INDEPENDENT_AMBULATORY_CARE_PROVIDER_SITE_OTHER): Payer: PPO

## 2022-10-05 DIAGNOSIS — I5042 Chronic combined systolic (congestive) and diastolic (congestive) heart failure: Secondary | ICD-10-CM

## 2022-10-05 DIAGNOSIS — Z95810 Presence of automatic (implantable) cardiac defibrillator: Secondary | ICD-10-CM

## 2022-10-08 ENCOUNTER — Encounter (HOSPITAL_COMMUNITY): Payer: Self-pay | Admitting: Internal Medicine

## 2022-10-08 ENCOUNTER — Encounter (HOSPITAL_COMMUNITY): Payer: PPO

## 2022-10-09 ENCOUNTER — Telehealth (HOSPITAL_COMMUNITY): Payer: Self-pay | Admitting: *Deleted

## 2022-10-09 ENCOUNTER — Telehealth: Payer: Self-pay

## 2022-10-09 NOTE — Telephone Encounter (Signed)
Attempted to call patient regarding upcoming cardiac PET appointment. Left message on voicemail with name and callback number  Larey Brick RN Navigator Cardiac Imaging [facility] V[facility] [phone] Office [phone] Cell  Reminder that diet prep will start on Monday.

## 2022-10-09 NOTE — Progress Notes (Signed)
EPIC Encounter for ICM Monitoring  Patient Name: [name] is a 47 y.o. female Date: 10/09/2022 Primary Care Physican: Myrlene Broker, MD Primary Cardiologist: Bensimhon Electrophysiologist: Graciela Husbands 11/19/2021 Weight: 214 lbs      04/08/2022 Weight: 190-194 lbs      05/13/2022 Weight: 193 lbs     09/15/2022 Weight: 206 lbs   (baseline closer to 198 lbs)                             Attempted call to patient and unable to reach.  Left detailed message per DPR regarding transmission. Transmission reviewed.    Heartlogic HF Index decreased 35 to 27 suggesting fluid levels may be trending back to normal but possible fluid accumulation remains which started 3/22.     Barostim implant 6/27.   Prescribed: Torsemide 20 mg Take 1 tablet  (20 mg total) by mouth daily. Can take extra 20 mg as needed. Potassium 20 mEq take 2 tablet(s) by mouth daily Spironolactone 25 mg take 1 tablet (25 mg total) at bedtime.    Labs: 07/30/2022 Creatinine 0.85, BUN 6,   Potassium 3.6, Sodium 136, GFR >60 07/03/2022 Creatinine 0.93, BUN 5,   Potassium 3.6, Sodium 136, GFR 76 06/03/2022 Creatinine 0.87, BUN 8,   Potassium 3.8, Sodium 135, GFR >60, BNP 710.3 05/06/2022 Creatinine 1.30, BUN 8,   Potassium 3.2, Sodium 137, GFR 51,   BNP 251.3 05/04/2022 Creatinine 1.07, BUN 9,   Potassium 3.9, Sodium 139  A complete set of results can be found in Results Review.   Recommendations:  Unable to reach.     Follow-up plan: ICM clinic phone appointment on 10/20/2022.  91 day device clinic remote transmission 12/17/2022.              EP/Cardiology next office visit:  11/05/2022 with Otilio Saber, PA.  Pt aware she needs to schedule office visit with Dr Leory Plowman.            Copy of ICM check sent to Dr. Graciela Husbands.  Will send copy to Dr Gala Romney for review and recommendations if patient is reached.  3 Month HeartLogicT Heart Failure Index:    8 Day Data Trend:          Karie Soda, RN 10/09/2022 2:09 PM

## 2022-10-09 NOTE — Telephone Encounter (Signed)
Remote ICM transmission received.  Attempted call to patient regarding ICM remote transmission and left detailed message per DPR.  Advised to return call for any fluid symptoms or questions. Next ICM remote transmission scheduled 10/20/2022.    

## 2022-10-13 ENCOUNTER — Ambulatory Visit (HOSPITAL_COMMUNITY)
Admission: RE | Admit: 2022-10-13 | Discharge: 2022-10-13 | Disposition: A | Discharge status: Home / Self Care | Payer: PPO | Source: Ambulatory Visit | Attending: Internal Medicine | Admitting: Internal Medicine

## 2022-10-13 ENCOUNTER — Encounter (HOSPITAL_COMMUNITY): Payer: Self-pay

## 2022-10-14 ENCOUNTER — Encounter (HOSPITAL_COMMUNITY): Payer: Self-pay

## 2022-10-14 NOTE — Progress Notes (Signed)
Remote ICD transmission.   

## 2022-10-20 ENCOUNTER — Ambulatory Visit: Payer: PPO | Attending: Internal Medicine

## 2022-10-20 DIAGNOSIS — I5042 Chronic combined systolic (congestive) and diastolic (congestive) heart failure: Secondary | ICD-10-CM | POA: Diagnosis not present

## 2022-10-20 DIAGNOSIS — Z95810 Presence of automatic (implantable) cardiac defibrillator: Secondary | ICD-10-CM | POA: Diagnosis not present

## 2022-10-21 ENCOUNTER — Telehealth: Payer: Self-pay

## 2022-10-21 NOTE — Telephone Encounter (Signed)
Remote ICM transmission received.  Attempted call to patient regarding ICM remote transmission and left detailed message per DPR.  Advised to return call for any fluid symptoms or questions. Next ICM remote transmission scheduled 6//08/2022.

## 2022-10-21 NOTE — Progress Notes (Signed)
EPIC Encounter for ICM Monitoring  Patient Name: [name] is a 47 y.o. female Date: 10/21/2022 Primary Care Physican: Myrlene Broker, MD Primary Cardiologist: Bensimhon Electrophysiologist: Graciela Husbands 11/19/2021 Weight: 214 lbs      04/08/2022 Weight: 190-194 lbs      05/13/2022 Weight: 193 lbs     09/15/2022 Weight: 206 lbs   (baseline closer to 198 lbs)                             Attempted call to patient and unable to reach.  Left detailed message per DPR regarding transmission. Transmission reviewed.    Heartlogic HF Index increased from 27 to 35 suggesting possible fluid accumulation worsening since 5/15.  Possible fluid accumulation started 3/22.    Thoracic impedance trending low.   Barostim implant 6/27.   Prescribed: Torsemide 20 mg Take 1 tablet  (20 mg total) by mouth daily. Can take extra 20 mg as needed. Potassium 20 mEq take 2 tablet(s) by mouth daily Spironolactone 25 mg take 1 tablet (25 mg total) at bedtime.    Labs: 07/30/2022 Creatinine 0.85, BUN 6,   Potassium 3.6, Sodium 136, GFR >60 07/03/2022 Creatinine 0.93, BUN 5,   Potassium 3.6, Sodium 136, GFR 76 06/03/2022 Creatinine 0.87, BUN 8,   Potassium 3.8, Sodium 135, GFR >60, BNP 710.3 05/06/2022 Creatinine 1.30, BUN 8,   Potassium 3.2, Sodium 137, GFR 51,   BNP 251.3 05/04/2022 Creatinine 1.07, BUN 9,   Potassium 3.9, Sodium 139  A complete set of results can be found in Results Review.   Recommendations:  Unable to reach.     Follow-up plan: ICM clinic phone appointment on 10/27/2022 to recheck fluid levels.  91 day device clinic remote transmission 12/17/2022.              EP/Cardiology next office visit:  11/05/2022 with Otilio Saber, PA.  Pt aware she needs to schedule office visit with Dr Leory Plowman.            Copy of ICM check sent to Dr. Graciela Husbands.  Will send copy to Dr Gala Romney for review and recommendations if patient is reached.  3 Month HeartLogicT Heart Failure Index:    8 Day Data Trend:           Karie Soda, RN 10/21/2022 3:25 PM

## 2022-10-23 ENCOUNTER — Other Ambulatory Visit: Payer: Self-pay

## 2022-10-23 ENCOUNTER — Encounter: Payer: Self-pay | Admitting: Internal Medicine

## 2022-10-23 ENCOUNTER — Emergency Department (HOSPITAL_COMMUNITY)
Admission: EM | Admit: 2022-10-23 | Discharge: 2022-10-24 | Disposition: A | Discharge status: Home / Self Care | Payer: PPO | Attending: Emergency Medicine | Admitting: Emergency Medicine

## 2022-10-23 ENCOUNTER — Emergency Department (HOSPITAL_COMMUNITY): Payer: PPO

## 2022-10-23 ENCOUNTER — Encounter (HOSPITAL_COMMUNITY): Payer: Self-pay | Admitting: Emergency Medicine

## 2022-10-23 DIAGNOSIS — I509 Heart failure, unspecified: Secondary | ICD-10-CM | POA: Diagnosis not present

## 2022-10-23 DIAGNOSIS — I110 Hypertensive heart disease with heart failure: Secondary | ICD-10-CM | POA: Insufficient documentation

## 2022-10-23 DIAGNOSIS — R059 Cough, unspecified: Secondary | ICD-10-CM | POA: Diagnosis not present

## 2022-10-23 DIAGNOSIS — R079 Chest pain, unspecified: Secondary | ICD-10-CM | POA: Diagnosis not present

## 2022-10-23 LAB — BASIC METABOLIC PANEL
Anion gap: 11 (ref 5–15)
BUN: 5 mg/dL — ABNORMAL LOW (ref 6–20)
CO2: 22 mmol/L (ref 22–32)
Calcium: 8.8 mg/dL — ABNORMAL LOW (ref 8.9–10.3)
Chloride: 104 mmol/L (ref 98–111)
Creatinine, Ser: 0.98 mg/dL (ref 0.44–1.00)
GFR, Estimated: >60 mL/min (ref >60)
Glucose, Bld: 94 mg/dL (ref 70–99)
Potassium: 3.4 mmol/L — ABNORMAL LOW (ref 3.5–5.1)
Sodium: 137 mmol/L (ref 135–145)

## 2022-10-23 LAB — CBC
HCT: 40.8 % (ref 36.0–46.0)
Hemoglobin: 12.6 g/dL (ref 12.0–15.0)
MCH: 25.9 pg — ABNORMAL LOW (ref 26.0–34.0)
MCHC: 30.9 g/dL (ref 30.0–36.0)
MCV: 84 fL (ref 80.0–100.0)
Platelets: 238 10*3/uL (ref 150–400)
RBC: 4.86 MIL/uL (ref 3.87–5.11)
RDW: 15.4 % (ref 11.5–15.5)
WBC: 7.3 10*3/uL (ref 4.0–10.5)
nRBC: 0 % (ref 0.0–0.2)

## 2022-10-23 LAB — TROPONIN I (HIGH SENSITIVITY): Troponin I (High Sensitivity): 6 ng/L (ref <18)

## 2022-10-23 MED ORDER — LIDOCAINE 5 % EX PTCH: 1.0000 | MEDICATED_PATCH | CUTANEOUS | 3 refills | Status: DC

## 2022-10-23 NOTE — ED Triage Notes (Addendum)
Pt in with cough that began yesterday, states she woke up today with chest pressure that starts in center and radiates to L breast, has [employer] defib and Barostim to R chest. +sob

## 2022-10-24 LAB — I-STAT BETA HCG BLOOD, ED (MC, WL, AP ONLY): I-stat hCG, quantitative: <5 m[IU]/mL (ref <5)

## 2022-10-24 LAB — TROPONIN I (HIGH SENSITIVITY): Troponin I (High Sensitivity): 7 ng/L (ref <18)

## 2022-10-24 LAB — BRAIN NATRIURETIC PEPTIDE: B Natriuretic Peptide: 750.8 pg/mL — ABNORMAL HIGH (ref 0.0–100.0)

## 2022-10-24 MED ORDER — FUROSEMIDE 10 MG/ML IJ SOLN
40.0000 mg | INTRAMUSCULAR | Status: AC
  Administered 2022-10-24: 40 mg via INTRAVENOUS
  Filled 2022-10-24: qty 4

## 2022-10-24 MED ORDER — POTASSIUM CHLORIDE CRYS ER 20 MEQ PO TBCR
40.0000 meq | EXTENDED_RELEASE_TABLET | Freq: Once | ORAL | Status: AC
  Administered 2022-10-24: 40 meq via ORAL
  Filled 2022-10-24: qty 2

## 2022-10-24 NOTE — ED Provider Notes (Signed)
[facility]EMERGENCY DEPT [facility] Emergency Department Provider Note MRN:  [id number]  Arrival date & time: 10/24/22     Chief Complaint   Cough and Chest Pain   History of Present Illness   [name] is a 47 y.o. year-old female presents to the ED with chief complaint of hypertension, postpartum cardiomyopathy, CHF, presents to the emergency department with a chief complaint of chest pain ago shortness of breath, cough.  She states that this feels similar to her congestive heart failure exacerbations.  She states that she took an extra dose of torsemide, but has not had any relief.  She denies any fevers or chills.  She denies any other associated symptoms.  History provided by patient.   Review of Systems  Pertinent positive and negative review of systems noted in HPI.    Physical Exam   Vitals:   10/24/22 0300 10/24/22 0423  BP:    Pulse:    Resp:    Temp: 98.6 F (37 C)   SpO2:  100%    CONSTITUTIONAL:  well-appearing, NAD NEURO:  Alert and oriented x 3, CN 3-12 grossly intact EYES:  eyes equal and reactive ENT/NECK:  Supple, no stridor  CARDIO:  normal rate, regular rhythm, appears well-perfused  PULM:  No respiratory distress, CTAB GI/GU:  non-distended,  MSK/SPINE:  No gross deformities, no edema, moves all extremities  SKIN:  no rash, atraumatic   *Additional and/or pertinent findings included in MDM below  Diagnostic and Interventional Summary    EKG Interpretation  Date/Time:    Ventricular Rate:    PR Interval:    QRS Duration:   QT Interval:    QTC Calculation:   R Axis:     Text Interpretation:         Labs Reviewed  BASIC METABOLIC PANEL - Abnormal; Notable for the following components:      Result Value   Potassium 3.4 (*)    BUN 5 (*)    Calcium 8.8 (*)    All other components within normal limits  CBC - Abnormal; Notable for the following components:   MCH 25.9 (*)    All other components within normal limits  BRAIN  NATRIURETIC PEPTIDE - Abnormal; Notable for the following components:   B Natriuretic Peptide 750.8 (*)    All other components within normal limits  I-STAT BETA HCG BLOOD, ED ([facility], [facility], [facility] ONLY)  TROPONIN I (HIGH SENSITIVITY)  TROPONIN I (HIGH SENSITIVITY)    DG Chest 2 View  Final Result      Medications  furosemide (LASIX) injection 40 mg (40 mg Intravenous Given 10/24/22 0222)  potassium chloride SA (KLOR-CON M) CR tablet 40 mEq (40 mEq Oral Given 10/24/22 0205)     Procedures  /  Critical Care Procedures  ED Course and Medical Decision Making  I have reviewed the triage vital signs, the nursing notes, and pertinent available records from the EMR.  Social Determinants Affecting Complexity of Care: Patient has no clinically significant social determinants affecting this chief complaint..   ED Course:    Medical Decision Making Patient here with with cough, chest pain, shortness of breath.  Has history of CHF.  Has defibrillator and Barostim.  Follows with Dr. Gala Romney.  Last EF from 2023 was 25 to 30%.  Patient states that this feels similar to her prior CHF exacerbations.  She took an extra dose of her torsemide, but denies any improvement.  Will give her a dose of IV Lasix.  Her labs are fairly reassuring.  Normal troponin.  BNP is still pending.  Potassium is 3.4.  No leukocytosis.  Chest x-ray shows stable cardiomegaly, but without effusion or marked edema.  No evidence of pneumonia.  Doubt PE.  Doubt ACS, troponins are negative.  Patient has been urinating following IV Lasix.  She is able to ambulate around the emergency department with oxygen maintaining at 100%.  Will have her follow-up outpatient.  Discussed return precautions.  She is agreeable with this plan.  Patient stable for discharge.  Amount and/or Complexity of Data Reviewed Labs: ordered.    Details: BNP is 750, will give IV lasix and reassess.  No respiratory distress. Trop is 6, repeat is 7. Potassium is  3.4, will supplement in ED. Radiology: ordered and independent interpretation performed.    Details: No opacity seen, no pleural effusion seen, device is present ECG/medicine tests: ordered.  Risk Prescription drug management.     Consultants: No consultations were needed in caring for this patient.   Treatment and Plan: I considered admission due to patient's initial presentation, but after considering the examination and diagnostic results, patient will not require admission and can be discharged with outpatient follow-up.    Final Clinical Impressions(s) / ED Diagnoses     ICD-10-CM   1. Acute on chronic congestive heart failure, unspecified heart failure type (HCC)  I50.9 Ambulatory referral to Cardiology      ED Discharge Orders          Ordered    Ambulatory referral to Cardiology       Comments: If you have not heard from the Cardiology office within the next 72 hours please call [phone].   10/24/22 0427              Discharge Instructions Discussed with and Provided to Patient:     Discharge Instructions      Continue your regular home meds. Return for new or worsening symptoms.       Roxy Horseman, PA-C 10/24/22 0429    Nira Conn, MD 10/24/22 [phone]

## 2022-10-24 NOTE — ED Notes (Signed)
[employer] ICD interrogated at this time

## 2022-10-24 NOTE — Discharge Instructions (Signed)
Continue your regular home meds. Return for new or worsening symptoms.

## 2022-10-26 ENCOUNTER — Ambulatory Visit: Payer: PPO | Attending: Internal Medicine

## 2022-10-26 DIAGNOSIS — I5042 Chronic combined systolic (congestive) and diastolic (congestive) heart failure: Secondary | ICD-10-CM

## 2022-10-26 DIAGNOSIS — Z95810 Presence of automatic (implantable) cardiac defibrillator: Secondary | ICD-10-CM

## 2022-10-27 ENCOUNTER — Telehealth: Payer: Self-pay

## 2022-10-27 ENCOUNTER — Other Ambulatory Visit (HOSPITAL_COMMUNITY): Payer: PPO

## 2022-10-27 ENCOUNTER — Other Ambulatory Visit: Payer: Self-pay | Admitting: Radiology

## 2022-10-27 DIAGNOSIS — D259 Leiomyoma of uterus, unspecified: Secondary | ICD-10-CM

## 2022-10-27 NOTE — H&P (Deleted)
  The note originally documented on this encounter has been moved the the encounter in which it belongs.  

## 2022-10-27 NOTE — H&P (Signed)
Referring Physician(s): Silva,B  Supervising Physician: Ruel Favors  Patient Status:  [facility] OP TBA  Chief Complaint: Symptomatic uterine fibroids   Subjective: Patient known to IR team from auscultation with Dr. Richardson Chiquito on 09/22/2022 to discuss treatment options for symptomatic uterine fibroids.  She is a 47 year old female with past medical history significant for CHF, AICD/barostem placements ,anxiety, anemia, arthritis, asthma, depression, diabetes, diverticulosis, hypertension, gallstones with prior cholecystectomy, prior gastric sleeve resection, IBS, migraine headaches, nonischemic cardiomyopathy, sleep apnea, childhood seizures who presents now with symptomatic uterine fibroids.  Following discussion with Dr. Miles Costain patient was deemed an appropriate candidate for bilateral uterine artery embolization and presents today for the procedure. She currently denies fever,HA,CP,dyspnea, cough, abd/back pain,N/V. She does have hx menorrhagia.     Past Medical History:  Diagnosis Date   Acute on chronic systolic (congestive) heart failure (HCC) 04/29/2017   Acute pain of right shoulder 06/16/2017   AICD (automatic cardioverter/defibrillator) present    Anginal pain (HCC)    Anxiety    Arthritis    right shoulder    Asthma    CHF (congestive heart failure) (HCC)    Chronic combined systolic and diastolic heart failure (HCC) 03/05/2014   Closed low lateral malleolus fracture 10/23/2013   Cystitis 10/21/2017   Depression    Depression with anxiety 01/20/2013   Diabetes mellitus without complication (HCC)    Diverticulosis    Dyspnea    comes and goes intermittently mostly with exertion    Dysrhythmia    Essential hypertension    Prev followed by [name]    Fibroid    age 89   Gallstones    Generalized abdominal cramping    History of cardiomyopathy    Hypertension    IBS (irritable bowel syndrome)    ICD (implantable cardioverter-defibrillator), single, in situ  12/14/2016   Insomnia 05/07/2017   Labral tear of shoulder 04/04/2015    Injected 04/04/2015 Injected 12/03/2015    Migraine    "monthly" (08/03/2016)   Myofascial pain 06/16/2017   NICM (nonischemic cardiomyopathy) (HCC) 08/03/2016   Nonallopathic lesion of lumbosacral region 11/16/2016   Nonallopathic lesion of sacral region 11/16/2016   Nonallopathic lesion of thoracic region 08/20/2014   Nonspecific chest pain 04/28/2017   OSA (obstructive sleep apnea) 01/02/2013   NPSG 2009:  AHI 9/hr. CPAP intolerance >> "smothering" Good tolerance of auto device (optimal pressure 12-13 on download).  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   PVC (premature ventricular contraction) 06/23/2016   Seizures (HCC)    "as a child" (08/03/2016)   Termination of pregnancy    due to cardiac risk   Past Surgical History:  Procedure Laterality Date   BREAST BIOPSY Right 09/30/2022   [place] RT BREAST BX W LOC DEV 1ST LESION IMG BX SPEC US GUIDE 09/30/2022 [facility] MAMMOGRAPHY   CARDIAC CATHETERIZATION N/A 11/11/2015   Procedure: Right/Left Heart Cath and Coronary Angiography;  Surgeon: Lennette Bihari, MD;  [facility]: [facility] INV[facility] CV LAB;  Service: Cardiovascular;  Laterality: N/A;   CARDIAC CATHETERIZATION  ~ 2015   CARDIAC DEFIBRILLATOR PLACEMENT  08/03/2016   CESAREAN SECTION  1999   COLONOSCOPY WITH PROPOFOL N/A 04/21/2016   Procedure: COLONOSCOPY WITH PROPOFOL;  Surgeon: Beverley Fiedler, MD;  Location: [facility] ENDOSCOPY;  Service: Gastroenterology;  Laterality: N/A;   ESOPHAGOGASTRODUODENOSCOPY (EGD) WITH PROPOFOL N/A 04/21/2016   Procedure: ESOPHAGOGASTRODUODENOSCOPY (EGD) WITH PROPOFOL;  Surgeon: Beverley Fiedler, MD;  Location: [facility] ENDOS[facility];  Service: Gastroenterology;  Laterality: N/A;   FOOT FRACTURE SURGERY Right ~ 2003   FRACTURE SURGERY     ICD IMPLANT N/A  08/03/2016   Procedure: ICD Implant;  Surgeon: Duke Salvia, MD;  Location: [facility] INVASIVE CV LAB;  Service: Cardiovascular;  Laterality: N/A;   LAPAROSCOPIC CHOLECYSTECTOMY  12/2006   LAPAROSCOPIC GASTRIC SLEEVE RESECTION     LAPAROSCOPY ABDOMEN DIAGNOSTIC  2008   "cut bile duct w/gallbladder OR; had to go in later & fix leak; hospitalized for 2 months"   LEFT HEART CATHETERIZATION WITH CORONARY ANGIOGRAM N/A 02/26/2014   Procedure: LEFT HEART CATHETERIZATION WITH CORONARY ANGIOGRAM;  Surgeon: Corky Crafts, MD;  Location: [facility] CATH LAB;  Service: Cardiovascular;  Laterality: N/A;   OVARIAN CYST REMOVAL Right 1999   PVC ABLATION N/A 12/01/2017   Procedure: PVC ABLATION;  Surgeon: Marinus Maw, MD;  [facility];  Service: Cardiovascular;  Laterality: N/A;   RIGHT HEART CATH N/A 08/05/2017   Procedure: RIGHT HEART CATH;  Surgeon: Dolores Patty, MD;  [facility]: [facility];  Service: Cardiovascular;  Laterality: N/A;   RIGHT HEART CATH N/A 11/16/2019   Procedure: RIGHT HEART CATH;  Surgeon: Dolores Patty, MD;  [facility]: [facility];  Service: Cardiovascular;  Laterality: N/A;   TUBAL LIGATION  1999      Allergies: Vancomycin, Aspirin, Contrast media [iodinated contrast media], Ciprofloxacin, Cyclobenzaprine, Farxiga [dapagliflozin], and Sulfa antibiotics  Medications: Prior to Admission medications   Medication Sig Start Date End Date Taking? Authorizing Provider  acetaminophen (TYLENOL) 650 MG CR tablet Take 650 mg by mouth every 8 (eight) hours as needed for pain (headache).    [provider]  albuterol (VENTOLIN HFA) 108 (90 Base) MCG/ACT inhaler INHALE 1 TO 2 PUFFS BY MOUTH EVERY 6 HOURS AS NEEDED FOR WHEEZING FOR SHORTNESS OF BREATH 06/08/22   Myrlene Broker, MD  amiodarone (PACERONE) 200 MG tablet Take 1 tablet (200 mg total) by mouth daily. 02/04/21   Graciella Freer, PA-C  budesonide-formoterol (SYMBICORT) 160-4.5  MCG/ACT inhaler Inhale 2 puffs into the lungs 2 (two) times daily as needed. 03/13/22   Myrlene Broker, MD  CORLANOR 7.5 MG TABS tablet TAKE 1 TABLET BY MOUTH TWICE DAILY WITH A MEAL 02/16/22   Bensimhon, Bevelyn Buckles, MD  dicyclomine (BENTYL) 20 MG tablet Take 1 tablet (20 mg total) by mouth daily as needed for spasms. 05/30/20   Myrlene Broker, MD  empagliflozin (JARDIANCE) 25 MG TABS tablet Take 1 tablet (25 mg total) by mouth daily before breakfast. 03/10/22   Myrlene Broker, MD  fluticasone [facility]) 50 MCG/ACT nasal spray Place 2 sprays into both nostrils daily as needed for allergies. 03/13/22   Myrlene Broker, MD  gabapentin (NEURONTIN) 300 MG capsule Take 1 capsule (300 mg total) by mouth at bedtime as needed (back spasms). 05/30/20   Myrlene Broker, MD  levonorgestrel-ethinyl estradiol (SEASONALE) 0.15-0.03 MG tablet Take 1 tablet by mouth daily. Uncertain the name of the birth control pill.    [provider]  lidocaine (LIDODERM) 5 % Place 1 patch onto the skin daily. Remove & Discard patch within 12 hours or as directed by MD 10/23/22   Myrlene Broker, MD  Magnesium Oxide 200 MG TABS Take 1 tablet (200 mg total) by  mouth daily. 05/30/20   Bensimhon, Bevelyn Buckles, MD  metoprolol succinate (TOPROL-XL) 100 MG 24 hr tablet Take 1 tablet (100 mg total) by mouth daily. Take with or immediately following a meal. 07/31/22   Bensimhon, Bevelyn Buckles, MD  mexiletine (MEXITIL) 150 MG capsule Take 2 capsules (300 mg total) by mouth 2 (two) times daily. 06/03/22   Milford, Anderson Malta, FNP  mirtazapine (REMERON) 15 MG tablet Take 1 tablet (15 mg total) by mouth at bedtime. 08/18/22   Myrlene Broker, MD  Multiple Vitamins-Minerals (MULTIVITAMIN GUMMIES WOMENS PO) Take 2 tablets by mouth daily.    [provider]  nitroGLYCERIN (NITROSTAT) 0.4 MG SL tablet Place 1 tablet (0.4 mg total) under the tongue every 5 (five) minutes as needed for chest pain. 06/20/21    Bensimhon, Bevelyn Buckles, MD  potassium chloride SA (KLOR-CON M) 20 MEQ tablet Take 2 tablets (40 mEq total) by mouth daily. 07/21/22   Bensimhon, Bevelyn Buckles, MD  sacubitril-valsartan (ENTRESTO) 97-103 MG Take 1 tablet by mouth twice daily 08/31/22   Bensimhon, Bevelyn Buckles, MD  spironolactone (ALDACTONE) 25 MG tablet Take 1 tablet (25 mg total) by mouth at bedtime. 01/14/21   Bensimhon, Bevelyn Buckles, MD  tizanidine (ZANAFLEX) 2 MG capsule Take 2 mg by mouth as needed for muscle spasms.    [provider]  torsemide (DEMADEX) 20 MG tablet Take 1 tablet (20 mg total) by mouth daily. Can take extra 20 mg as needed for swelling 06/08/22   Milford, [place], FNP  Ubrogepant (UBRELVY) 100 MG TABS Take 100 mg by mouth daily as needed (migraine). 06/11/21   Myrlene Broker, MD  Vitamin D, Ergocalciferol, (DRISDOL) 1.25 MG (50000 UNIT) CAPS capsule TAKE 1 CAPSULE BY MOUTH EVERY 7 DAYS 06/02/22   Myrlene Broker, MD     Vital Signs: Vitals:   10/28/22 0733  BP: (!) 162/96  Pulse: (!) 103  Resp: 16  Temp: 98.5 F (36.9 C)  SpO2: 97%    LMP 04/08/2022 (Approximate)    Code Status: FULL CODE  Physical Exam:  awake/alert; chest- CTA bilat; rt chest wall barostem, left chest wall AICD; heart- sl tachy, reg rhythm; abd- soft,+BS,NT; no LE edema  Imaging: DG Chest 2 View  Result Date: 10/23/2022 CLINICAL DATA:  Cough and chest pain. EXAM: CHEST - 2 VIEW COMPARISON:  04/28/2022 radiograph and CT FINDINGS: Left-sided pacemaker. Right-sided vagal nerve stimulator. Stable cardiomegaly. Unchanged mediastinal contours. No focal airspace disease, pleural effusion, or pneumothorax. Postsurgical change in the left upper abdomen. No acute osseous abnormalities are seen. IMPRESSION: No acute chest findings. Stable cardiomegaly. Electronically Signed   By: Narda Rutherford M.D.   On: 10/23/2022 23:27    Labs:  CBC: Recent Labs    12/15/21 0331 12/16/21 0311 04/28/22 0305 10/23/22 2250  WBC 5.4 2.7* 7.1  7.3  HGB 13.4 12.6 11.3* 12.6  HCT 41.9 39.3 35.5* 40.8  PLT 159 174 248 238    COAGS: Recent Labs    12/14/21 0350  INR 1.0    BMP: Recent Labs    05/06/22 1111 06/03/22 1628 07/03/22 1127 07/30/22 1433 10/23/22 2250  NA 137 135 136 136 137  K 3.2* 3.8 3.6 3.6 3.4*  CL 100 104 101 106 104  CO2 26 24 25 22 22   GLUCOSE 95 87 88 99 94  BUN 8 8 5* 6 5*  CALCIUM 9.1 8.8* 8.6* 8.5* 8.8*  CREATININE 1.30* 0.87 0.93 0.85 0.98  GFRNONAA 51* >60  --  >  60 >60    LIVER FUNCTION TESTS: Recent Labs    12/13/21 1639 12/15/21 0331 12/16/21 0311  BILITOT 0.3 0.4 0.7  AST 24 30 38  ALT 19 23 24   ALKPHOS 57 56 57  PROT 7.2 7.0 6.5  ALBUMIN 3.6 3.3* 3.2*    Assessment and Plan: 47 year old female with past medical history significant for CHF, AICD/barostem placements ,anxiety, anemia, arthritis, asthma, depression, diabetes, diverticulosis, hypertension, gallstones with prior cholecystectomy, prior gastric sleeve resection, IBS, migraine headaches, nonischemic cardiomyopathy, sleep apnea, childhood seizures who presents now with symptomatic uterine fibroids.  Following consultation with Dr. Miles Costain on 09/22/22  to discuss treatment options patient was deemed an appropriate candidate for bilateral uterine artery embolization and presents today for the procedure.Risks and benefits of procedure were discussed with the patient including, but not limited to bleeding, infection, vascular injury or contrast induced renal failure.  This interventional procedure involves the use of X-rays and because of the nature of the planned procedure, it is possible that we will have prolonged use of X-ray fluoroscopy.  Potential radiation risks to you include (but are not limited to) the following: - A slightly elevated risk for cancer  several years later in life. This risk is typically less than 0.5% percent. This risk is low in comparison to the normal incidence of human cancer, which is 33% for women  and 50% for men according to the [employer] Society. - Radiation induced injury can include skin redness, resembling a rash, tissue breakdown / ulcers and hair loss (which can be temporary or permanent).   The likelihood of either of these occurring depends on the difficulty of the procedure and whether you are sensitive to radiation due to previous procedures, disease, or genetic conditions.   IF your procedure requires a prolonged use of radiation, you will be notified and given written instructions for further action.  It is your responsibility to monitor the irradiated area for the 2 weeks following the procedure and to notify your physician if you are concerned that you have suffered a radiation induced injury.    All of the patient's questions were answered, patient is agreeable to proceed.  Consent signed and in chart.      Electronically Signed: D. Jeananne Rama, PA-C 10/27/2022, 2:33 PM   I spent a total of 30 minutes at the the patient's bedside AND on the patient's hospital floor or unit, greater than 50% of which was counseling/coordinating care for bilateral uterine artery embolization

## 2022-10-27 NOTE — Progress Notes (Signed)
EPIC Encounter for ICM Monitoring  Patient Name: [name] is a 47 y.o. female Date: 10/27/2022 Primary Care Physican: Myrlene Broker, MD Primary Cardiologist: Bensimhon Electrophysiologist: Graciela Husbands 11/19/2021 Weight: 214 lbs      04/08/2022 Weight: 190-194 lbs      05/13/2022 Weight: 193 lbs     09/15/2022 Weight: 206 lbs   (baseline closer to 198 lbs)                             Attempted call to patient and unable to reach.  Left detailed message per DPR regarding transmission. Transmission reviewed.  ED visit resulted in IV Lasix on 6/1   Heartlogic HF Index decreased from 35 to 15 suggesting fluid levels returning to normal.   Thoracic impedance trending up.   Barostim implant 6/27.   Prescribed: Torsemide 20 mg Take 1 tablet  (20 mg total) by mouth daily. Can take extra 20 mg as needed. Potassium 20 mEq take 2 tablet(s) by mouth daily Spironolactone 25 mg take 1 tablet (25 mg total) at bedtime.    Labs: 05/312024 Creatinine 0.98, BUN 5,    Potassium 3.4, Sodium 137, GFR >60 07/30/2022 Creatinine 0.85, BUN 6,   Potassium 3.6, Sodium 136, GFR >60 07/03/2022 Creatinine 0.93, BUN 5,   Potassium 3.6, Sodium 136, GFR 76 06/03/2022 Creatinine 0.87, BUN 8,   Potassium 3.8, Sodium 135, GFR >60, BNP 710.3 05/06/2022 Creatinine 1.30, BUN 8,   Potassium 3.2, Sodium 137, GFR 51,   BNP 251.3 05/04/2022 Creatinine 1.07, BUN 9,   Potassium 3.9, Sodium 139  A complete set of results can be found in Results Review.   Recommendations:  Unable to reach.     Follow-up plan: ICM clinic phone appointment on 11/09/2022 to recheck fluid levels.  91 day device clinic remote transmission 12/17/2022.              EP/Cardiology next office visit:  11/05/2022 with Otilio Saber, PA.  Pt aware she needs to schedule office visit with Dr Leory Plowman.            Copy of ICM check sent to Dr. Graciela Husbands.    3 Month HeartLogicT Heart Failure Index:    8 Day Data Trend:          Karie Soda,  RN 10/27/2022 4:42 PM

## 2022-10-27 NOTE — Telephone Encounter (Signed)
Remote ICM transmission received.  Attempted call to patient regarding ICM remote transmission and left detailed message per DPR.  Left ICM phone number and advised to return call for any fluid symptoms or questions. Next ICM remote transmission scheduled 11/09/2022.

## 2022-10-28 ENCOUNTER — Other Ambulatory Visit (HOSPITAL_COMMUNITY): Payer: Self-pay | Admitting: Interventional Radiology

## 2022-10-28 ENCOUNTER — Observation Stay (HOSPITAL_COMMUNITY)
Admission: RE | Admit: 2022-10-28 | Discharge: 2022-10-29 | Disposition: A | Discharge status: Home / Self Care | Payer: PPO | Source: Ambulatory Visit | Attending: Interventional Radiology | Admitting: Interventional Radiology

## 2022-10-28 ENCOUNTER — Ambulatory Visit (HOSPITAL_COMMUNITY)
Admission: RE | Admit: 2022-10-28 | Discharge: 2022-10-28 | Disposition: A | Discharge status: Home / Self Care | Payer: PPO | Source: Ambulatory Visit | Attending: Interventional Radiology | Admitting: Interventional Radiology

## 2022-10-28 ENCOUNTER — Other Ambulatory Visit (HOSPITAL_COMMUNITY): Payer: Self-pay

## 2022-10-28 ENCOUNTER — Encounter (HOSPITAL_COMMUNITY): Payer: Self-pay

## 2022-10-28 ENCOUNTER — Encounter: Payer: Self-pay | Admitting: Hematology and Oncology

## 2022-10-28 VITALS — BP 172/106 | HR 112 | Temp 97.7°F | Resp 20

## 2022-10-28 DIAGNOSIS — D259 Leiomyoma of uterus, unspecified: Principal | ICD-10-CM | POA: Insufficient documentation

## 2022-10-28 DIAGNOSIS — F32A Depression, unspecified: Secondary | ICD-10-CM | POA: Insufficient documentation

## 2022-10-28 DIAGNOSIS — E119 Type 2 diabetes mellitus without complications: Secondary | ICD-10-CM | POA: Diagnosis not present

## 2022-10-28 DIAGNOSIS — D649 Anemia, unspecified: Secondary | ICD-10-CM | POA: Insufficient documentation

## 2022-10-28 DIAGNOSIS — G4733 Obstructive sleep apnea (adult) (pediatric): Secondary | ICD-10-CM | POA: Diagnosis not present

## 2022-10-28 DIAGNOSIS — R791 Abnormal coagulation profile: Secondary | ICD-10-CM

## 2022-10-28 DIAGNOSIS — J45909 Unspecified asthma, uncomplicated: Secondary | ICD-10-CM | POA: Diagnosis not present

## 2022-10-28 DIAGNOSIS — F419 Anxiety disorder, unspecified: Secondary | ICD-10-CM | POA: Diagnosis not present

## 2022-10-28 DIAGNOSIS — Z79899 Other long term (current) drug therapy: Secondary | ICD-10-CM | POA: Insufficient documentation

## 2022-10-28 DIAGNOSIS — Z7984 Long term (current) use of oral hypoglycemic drugs: Secondary | ICD-10-CM | POA: Insufficient documentation

## 2022-10-28 DIAGNOSIS — I5042 Chronic combined systolic (congestive) and diastolic (congestive) heart failure: Secondary | ICD-10-CM | POA: Diagnosis not present

## 2022-10-28 DIAGNOSIS — I428 Other cardiomyopathies: Secondary | ICD-10-CM | POA: Insufficient documentation

## 2022-10-28 DIAGNOSIS — I110 Hypertensive heart disease with heart failure: Secondary | ICD-10-CM | POA: Diagnosis not present

## 2022-10-28 DIAGNOSIS — D219 Benign neoplasm of connective and other soft tissue, unspecified: Secondary | ICD-10-CM

## 2022-10-28 DIAGNOSIS — Z95810 Presence of automatic (implantable) cardiac defibrillator: Secondary | ICD-10-CM | POA: Diagnosis not present

## 2022-10-28 DIAGNOSIS — N399 Disorder of urinary system, unspecified: Secondary | ICD-10-CM | POA: Diagnosis not present

## 2022-10-28 HISTORY — PX: IR US GUIDE VASC ACCESS LEFT: IMG2389

## 2022-10-28 HISTORY — PX: IR ANGIOGRAM SELECTIVE EACH ADDITIONAL VESSEL: IMG667

## 2022-10-28 HISTORY — PX: IR ANGIOGRAM PELVIS SELECTIVE OR SUPRASELECTIVE: IMG661

## 2022-10-28 HISTORY — PX: IR US GUIDE VASC ACCESS RIGHT: IMG2390

## 2022-10-28 HISTORY — PX: IR EMBO TUMOR ORGAN ISCHEMIA INFARCT INC GUIDE ROADMAPPING: IMG5449

## 2022-10-28 LAB — GLUCOSE, CAPILLARY: Glucose-Capillary: 90 mg/dL (ref 70–99)

## 2022-10-28 LAB — BASIC METABOLIC PANEL
Anion gap: 8 (ref 5–15)
BUN: 9 mg/dL (ref 6–20)
CO2: 22 mmol/L (ref 22–32)
Calcium: 8.1 mg/dL — ABNORMAL LOW (ref 8.9–10.3)
Chloride: 106 mmol/L (ref 98–111)
Creatinine, Ser: 0.83 mg/dL (ref 0.44–1.00)
GFR, Estimated: >60 mL/min (ref >60)
Glucose, Bld: 87 mg/dL (ref 70–99)
Potassium: 3.1 mmol/L — ABNORMAL LOW (ref 3.5–5.1)
Sodium: 136 mmol/L (ref 135–145)

## 2022-10-28 LAB — CBC WITH DIFFERENTIAL/PLATELET
Abs Immature Granulocytes: 0.01 10*3/uL (ref 0.00–0.07)
Basophils Absolute: 0 10*3/uL (ref 0.0–0.1)
Basophils Relative: 1 %
Eosinophils Absolute: 0.1 10*3/uL (ref 0.0–0.5)
Eosinophils Relative: 2 %
HCT: 34.6 % — ABNORMAL LOW (ref 36.0–46.0)
Hemoglobin: 10.5 g/dL — ABNORMAL LOW (ref 12.0–15.0)
Immature Granulocytes: 0 %
Lymphocytes Relative: 34 %
Lymphs Abs: 2.4 10*3/uL (ref 0.7–4.0)
MCH: 25.5 pg — ABNORMAL LOW (ref 26.0–34.0)
MCHC: 30.3 g/dL (ref 30.0–36.0)
MCV: 84.2 fL (ref 80.0–100.0)
Monocytes Absolute: 0.5 10*3/uL (ref 0.1–1.0)
Monocytes Relative: 7 %
Neutro Abs: 3.9 10*3/uL (ref 1.7–7.7)
Neutrophils Relative %: 56 %
Platelets: 218 10*3/uL (ref 150–400)
RBC: 4.11 MIL/uL (ref 3.87–5.11)
RDW: 15.1 % (ref 11.5–15.5)
WBC: 6.9 10*3/uL (ref 4.0–10.5)
nRBC: 0 % (ref 0.0–0.2)

## 2022-10-28 LAB — HCG, SERUM, QUALITATIVE: Preg, Serum: NEGATIVE

## 2022-10-28 LAB — PROTIME-INR
INR: 1 (ref 0.8–1.2)
Prothrombin Time: 13.8 seconds (ref 11.4–15.2)

## 2022-10-28 MED ORDER — NALOXONE HCL 0.4 MG/ML IJ SOLN
INTRAMUSCULAR | Status: AC
  Filled 2022-10-28: qty 1

## 2022-10-28 MED ORDER — POTASSIUM CHLORIDE CRYS ER 20 MEQ PO TBCR
40.0000 meq | EXTENDED_RELEASE_TABLET | Freq: Once | ORAL | Status: AC
  Administered 2022-10-28: 40 meq via ORAL
  Filled 2022-10-28: qty 2

## 2022-10-28 MED ORDER — MIDAZOLAM HCL 2 MG/2ML IJ SOLN
INTRAMUSCULAR | Status: AC
  Filled 2022-10-28: qty 4

## 2022-10-28 MED ORDER — MIDAZOLAM HCL 2 MG/2ML IJ SOLN
INTRAMUSCULAR | Status: AC
  Filled 2022-10-28: qty 2

## 2022-10-28 MED ORDER — METOPROLOL SUCCINATE ER 50 MG PO TB24
100.0000 mg | ORAL_TABLET | Freq: Every day | ORAL | Status: DC
  Administered 2022-10-28: 50 mg via ORAL
  Administered 2022-10-29: 100 mg via ORAL
  Filled 2022-10-28 (×2): qty 2

## 2022-10-28 MED ORDER — HYDROMORPHONE HCL 1 MG/ML IJ SOLN
INTRAMUSCULAR | Status: AC | PRN
  Administered 2022-10-28: 1 mg via INTRAVENOUS

## 2022-10-28 MED ORDER — EMPAGLIFLOZIN 25 MG PO TABS
25.0000 mg | ORAL_TABLET | Freq: Every day | ORAL | Status: DC
  Administered 2022-10-29: 25 mg via ORAL
  Filled 2022-10-28: qty 1

## 2022-10-28 MED ORDER — SODIUM CHLORIDE 0.9% FLUSH
3.0000 mL | Freq: Two times a day (BID) | INTRAVENOUS | Status: DC
  Administered 2022-10-28 – 2022-10-29 (×2): 3 mL via INTRAVENOUS

## 2022-10-28 MED ORDER — DIPHENHYDRAMINE HCL 50 MG/ML IJ SOLN
INTRAMUSCULAR | Status: AC
  Filled 2022-10-28: qty 1

## 2022-10-28 MED ORDER — SACUBITRIL-VALSARTAN 97-103 MG PO TABS
1.0000 | ORAL_TABLET | Freq: Two times a day (BID) | ORAL | Status: DC
  Administered 2022-10-28 – 2022-10-29 (×3): 1 via ORAL
  Filled 2022-10-28 (×3): qty 1

## 2022-10-28 MED ORDER — HYDROMORPHONE HCL 1 MG/ML IJ SOLN
INTRAMUSCULAR | Status: AC
  Filled 2022-10-28: qty 1

## 2022-10-28 MED ORDER — PROMETHAZINE HCL 25 MG PO TABS: 25.0000 mg | ORAL_TABLET | Freq: Three times a day (TID) | ORAL | Status: DC | PRN

## 2022-10-28 MED ORDER — MAGNESIUM OXIDE -MG SUPPLEMENT 400 (240 MG) MG PO TABS
200.0000 mg | ORAL_TABLET | Freq: Every day | ORAL | Status: DC
  Administered 2022-10-28 – 2022-10-29 (×2): 200 mg via ORAL
  Filled 2022-10-28 (×2): qty 1

## 2022-10-28 MED ORDER — MIRTAZAPINE 15 MG PO TABS
15.0000 mg | ORAL_TABLET | Freq: Every day | ORAL | Status: DC
  Filled 2022-10-28: qty 1

## 2022-10-28 MED ORDER — ONDANSETRON HCL 4 MG/2ML IJ SOLN
4.0000 mg | Freq: Once | INTRAMUSCULAR | Status: AC
  Administered 2022-10-28: 4 mg via INTRAVENOUS
  Filled 2022-10-28: qty 2

## 2022-10-28 MED ORDER — AMIODARONE HCL 200 MG PO TABS
200.0000 mg | ORAL_TABLET | Freq: Every day | ORAL | Status: DC
  Administered 2022-10-28 – 2022-10-29 (×2): 200 mg via ORAL
  Filled 2022-10-28 (×2): qty 1

## 2022-10-28 MED ORDER — UBROGEPANT 100 MG PO TABS: 100.0000 mg | ORAL_TABLET | Freq: Every day | ORAL | Status: DC | PRN

## 2022-10-28 MED ORDER — ALBUTEROL SULFATE HFA 108 (90 BASE) MCG/ACT IN AERS: 2.0000 | INHALATION_SPRAY | RESPIRATORY_TRACT | Status: DC | PRN

## 2022-10-28 MED ORDER — VITAMIN D (ERGOCALCIFEROL) 1.25 MG (50000 UNIT) PO CAPS: 50000.0000 [IU] | ORAL_CAPSULE | ORAL | Status: DC

## 2022-10-28 MED ORDER — FLUTICASONE PROPIONATE 50 MCG/ACT NA SUSP: 2.0000 | Freq: Every day | NASAL | Status: DC | PRN

## 2022-10-28 MED ORDER — DICYCLOMINE HCL 20 MG PO TABS
20.0000 mg | ORAL_TABLET | Freq: Every day | ORAL | Status: DC | PRN
  Filled 2022-10-28: qty 1

## 2022-10-28 MED ORDER — DOCUSATE SODIUM 100 MG PO CAPS
100.0000 mg | ORAL_CAPSULE | Freq: Two times a day (BID) | ORAL | Status: DC
  Administered 2022-10-29: 100 mg via ORAL
  Filled 2022-10-28 (×2): qty 1

## 2022-10-28 MED ORDER — NITROGLYCERIN 0.4 MG SL SUBL: 0.4000 mg | SUBLINGUAL_TABLET | SUBLINGUAL | Status: DC | PRN

## 2022-10-28 MED ORDER — TORSEMIDE 20 MG PO TABS
20.0000 mg | ORAL_TABLET | Freq: Every day | ORAL | Status: DC | PRN
  Administered 2022-10-29: 20 mg via ORAL
  Filled 2022-10-28 (×2): qty 1

## 2022-10-28 MED ORDER — SODIUM CHLORIDE 0.9% FLUSH: 3.0000 mL | INTRAVENOUS | Status: DC | PRN

## 2022-10-28 MED ORDER — ALBUTEROL SULFATE (2.5 MG/3ML) 0.083% IN NEBU
2.5000 mg | INHALATION_SOLUTION | RESPIRATORY_TRACT | Status: DC | PRN
  Administered 2022-10-28 – 2022-10-29 (×3): 2.5 mg via RESPIRATORY_TRACT
  Filled 2022-10-28 (×3): qty 3

## 2022-10-28 MED ORDER — GABAPENTIN 300 MG PO CAPS: 300.0000 mg | ORAL_CAPSULE | Freq: Every evening | ORAL | Status: DC | PRN

## 2022-10-28 MED ORDER — POTASSIUM CHLORIDE CRYS ER 20 MEQ PO TBCR
40.0000 meq | EXTENDED_RELEASE_TABLET | Freq: Every day | ORAL | Status: DC
  Administered 2022-10-28 – 2022-10-29 (×2): 40 meq via ORAL
  Filled 2022-10-28 (×2): qty 2

## 2022-10-28 MED ORDER — DIPHENHYDRAMINE HCL 50 MG/ML IJ SOLN: 12.5000 mg | Freq: Four times a day (QID) | INTRAMUSCULAR | Status: DC | PRN

## 2022-10-28 MED ORDER — ONDANSETRON HCL 4 MG/2ML IJ SOLN: 4.0000 mg | Freq: Four times a day (QID) | INTRAMUSCULAR | Status: DC | PRN

## 2022-10-28 MED ORDER — ADULT MULTIVITAMIN W/MINERALS CH
1.0000 | ORAL_TABLET | Freq: Every day | ORAL | Status: DC
  Administered 2022-10-29: 1 via ORAL
  Filled 2022-10-28 (×2): qty 1

## 2022-10-28 MED ORDER — TIZANIDINE HCL 2 MG PO CAPS: 2.0000 mg | ORAL_CAPSULE | ORAL | Status: DC | PRN

## 2022-10-28 MED ORDER — HYDROMORPHONE 1 MG/ML IV SOLN
INTRAVENOUS | Status: DC
  Administered 2022-10-28: 4.8 mg via INTRAVENOUS
  Administered 2022-10-28: 30 mg via INTRAVENOUS
  Administered 2022-10-28: 1.7 mg via INTRAVENOUS
  Administered 2022-10-28: 0.9 mg via INTRAVENOUS
  Administered 2022-10-28: 2.1 mg via INTRAVENOUS
  Filled 2022-10-28 (×12): qty 30

## 2022-10-28 MED ORDER — SPIRONOLACTONE 25 MG PO TABS
25.0000 mg | ORAL_TABLET | Freq: Every day | ORAL | Status: DC
  Administered 2022-10-28: 25 mg via ORAL
  Filled 2022-10-28: qty 1

## 2022-10-28 MED ORDER — CEFAZOLIN SODIUM-DEXTROSE 2-4 GM/100ML-% IV SOLN
2.0000 g | INTRAVENOUS | Status: AC
  Administered 2022-10-28: 2 g via INTRAVENOUS
  Filled 2022-10-28: qty 100

## 2022-10-28 MED ORDER — DIPHENHYDRAMINE HCL 12.5 MG/5ML PO ELIX: 12.5000 mg | ORAL_SOLUTION | Freq: Four times a day (QID) | ORAL | Status: DC | PRN

## 2022-10-28 MED ORDER — LIDOCAINE-EPINEPHRINE 1 %-1:100000 IJ SOLN
INTRAMUSCULAR | Status: AC
  Filled 2022-10-28: qty 1

## 2022-10-28 MED ORDER — FENTANYL CITRATE (PF) 100 MCG/2ML IJ SOLN
INTRAMUSCULAR | Status: AC
  Filled 2022-10-28: qty 4

## 2022-10-28 MED ORDER — IOHEXOL 300 MG/ML  SOLN
100.0000 mL | Freq: Once | INTRAMUSCULAR | Status: AC | PRN
  Administered 2022-10-28: 25 mL via INTRA_ARTERIAL

## 2022-10-28 MED ORDER — FENTANYL CITRATE (PF) 100 MCG/2ML IJ SOLN
INTRAMUSCULAR | Status: AC
  Filled 2022-10-28: qty 2

## 2022-10-28 MED ORDER — FLUMAZENIL 0.5 MG/5ML IV SOLN
INTRAVENOUS | Status: AC
  Filled 2022-10-28: qty 5

## 2022-10-28 MED ORDER — PROMETHAZINE HCL 25 MG RE SUPP: 25.0000 mg | Freq: Three times a day (TID) | RECTAL | Status: DC | PRN

## 2022-10-28 MED ORDER — MULTIVITAMIN GUMMIES WOMENS PO CHEW: CHEWABLE_TABLET | Freq: Every day | ORAL | Status: DC

## 2022-10-28 MED ORDER — MIDAZOLAM HCL 2 MG/2ML IJ SOLN
INTRAMUSCULAR | Status: AC | PRN
  Administered 2022-10-28: 1 mg via INTRAVENOUS
  Administered 2022-10-28 (×2): .5 mg via INTRAVENOUS
  Administered 2022-10-28 (×2): 1 mg via INTRAVENOUS

## 2022-10-28 MED ORDER — MOMETASONE FURO-FORMOTEROL FUM 200-5 MCG/ACT IN AERO
2.0000 | INHALATION_SPRAY | Freq: Two times a day (BID) | RESPIRATORY_TRACT | Status: DC
  Administered 2022-10-28 – 2022-10-29 (×2): 2 via RESPIRATORY_TRACT
  Filled 2022-10-28: qty 8.8

## 2022-10-28 MED ORDER — LIDOCAINE 5 % EX PTCH
1.0000 | MEDICATED_PATCH | CUTANEOUS | Status: DC
  Administered 2022-10-28: 1 via TRANSDERMAL
  Filled 2022-10-28: qty 1

## 2022-10-28 MED ORDER — SODIUM CHLORIDE 0.9 % IV SOLN: 250.0000 mL | INTRAVENOUS | Status: DC | PRN

## 2022-10-28 MED ORDER — DEXAMETHASONE SODIUM PHOSPHATE 10 MG/ML IJ SOLN
10.0000 mg | Freq: Once | INTRAMUSCULAR | Status: AC
  Administered 2022-10-28: 10 mg via INTRAVENOUS
  Filled 2022-10-28: qty 1

## 2022-10-28 MED ORDER — OXYCODONE HCL 5 MG PO TABS
5.0000 mg | ORAL_TABLET | ORAL | Status: DC | PRN
  Administered 2022-10-28 – 2022-10-29 (×5): 5 mg via ORAL
  Filled 2022-10-28 (×5): qty 1

## 2022-10-28 MED ORDER — ACETAMINOPHEN 500 MG PO TABS
1000.0000 mg | ORAL_TABLET | Freq: Four times a day (QID) | ORAL | Status: DC | PRN
  Administered 2022-10-28 – 2022-10-29 (×2): 1000 mg via ORAL
  Filled 2022-10-28 (×2): qty 2

## 2022-10-28 MED ORDER — LEVONORGEST-ETH ESTRAD 91-DAY 0.15-0.03 MG PO TABS
1.0000 | ORAL_TABLET | Freq: Every day | ORAL | Status: DC
  Administered 2022-10-29: 1 via ORAL

## 2022-10-28 MED ORDER — SODIUM CHLORIDE 0.9% FLUSH: 9.0000 mL | INTRAVENOUS | Status: DC | PRN

## 2022-10-28 MED ORDER — MEXILETINE HCL 150 MG PO CAPS
300.0000 mg | ORAL_CAPSULE | Freq: Two times a day (BID) | ORAL | Status: DC
  Administered 2022-10-28 – 2022-10-29 (×3): 300 mg via ORAL
  Filled 2022-10-28 (×3): qty 2

## 2022-10-28 MED ORDER — IVABRADINE HCL 7.5 MG PO TABS
7.5000 mg | ORAL_TABLET | Freq: Two times a day (BID) | ORAL | Status: DC
  Administered 2022-10-28 – 2022-10-29 (×2): 7.5 mg via ORAL
  Filled 2022-10-28 (×2): qty 1
  Filled 2022-10-28: qty 3

## 2022-10-28 MED ORDER — SODIUM CHLORIDE 0.9 % IV SOLN: INTRAVENOUS | Status: DC

## 2022-10-28 MED ORDER — TORSEMIDE 20 MG PO TABS
20.0000 mg | ORAL_TABLET | Freq: Every day | ORAL | Status: DC
  Filled 2022-10-28 (×2): qty 1

## 2022-10-28 MED ORDER — TIZANIDINE HCL 2 MG PO TABS
2.0000 mg | ORAL_TABLET | Freq: Every day | ORAL | Status: DC | PRN
  Administered 2022-10-29: 2 mg via ORAL
  Filled 2022-10-28: qty 1

## 2022-10-28 MED ORDER — NALOXONE HCL 0.4 MG/ML IJ SOLN: 0.4000 mg | INTRAMUSCULAR | Status: DC | PRN

## 2022-10-28 MED ORDER — MAGNESIUM OXIDE -MG SUPPLEMENT 200 MG PO TABS: 200.0000 mg | ORAL_TABLET | Freq: Every day | ORAL | Status: DC

## 2022-10-28 MED ORDER — ACETAMINOPHEN 10 MG/ML IV SOLN
1000.0000 mg | Freq: Once | INTRAVENOUS | Status: AC
  Administered 2022-10-28: 1000 mg via INTRAVENOUS
  Filled 2022-10-28: qty 100

## 2022-10-28 MED ORDER — IOHEXOL 300 MG/ML  SOLN
100.0000 mL | Freq: Once | INTRAMUSCULAR | Status: AC | PRN
  Administered 2022-10-28: 50 mL via INTRA_ARTERIAL

## 2022-10-28 MED ORDER — FENTANYL CITRATE (PF) 100 MCG/2ML IJ SOLN
INTRAMUSCULAR | Status: AC | PRN
  Administered 2022-10-28 (×2): 25 ug via INTRAVENOUS
  Administered 2022-10-28 (×3): 50 ug via INTRAVENOUS

## 2022-10-28 NOTE — Progress Notes (Addendum)
Patient ID: [name], female   DOB: 01-15-1976, 47 y.o.   MRN: [id number] Pt s/p bilat uterine artery embolization earlier today; c/o mod pelvic cramping , LBP; afebrile, BP ok; puncture sites R/L CFA soft, NT; no hematomas; intact distal pulses, yellow urine in foley; on PCA dilaudid now; will add oxycodone/ tylenol to regimen; pt for overnight obs, dc foley this pm; f/u with Dr. Miles Costain in IR clinic in 3-4 weeks

## 2022-10-28 NOTE — Procedures (Signed)
Interventional Radiology Procedure Note  Procedure: UFE    Complications: None  Estimated Blood Loss:  MIN  Findings: FULL REPORT IN PACS     M. TREVOR Dana Debo, MD    

## 2022-10-29 DIAGNOSIS — D259 Leiomyoma of uterus, unspecified: Secondary | ICD-10-CM | POA: Diagnosis not present

## 2022-10-29 MED ORDER — IBUPROFEN 600 MG PO TABS: 600.0000 mg | ORAL_TABLET | Freq: Four times a day (QID) | ORAL | 0 refills | Status: DC

## 2022-10-29 MED ORDER — DOCUSATE SODIUM 100 MG PO CAPS: 100.0000 mg | ORAL_CAPSULE | Freq: Every day | ORAL | 0 refills | Status: DC

## 2022-10-29 MED ORDER — ONDANSETRON HCL 4 MG PO TABS: 4.0000 mg | ORAL_TABLET | ORAL | 0 refills | Status: DC

## 2022-10-29 MED ORDER — OXYCODONE HCL 5 MG PO TABS: 5.0000 mg | ORAL_TABLET | ORAL | 0 refills | Status: DC | PRN

## 2022-10-29 NOTE — Plan of Care (Signed)

## 2022-10-29 NOTE — Progress Notes (Signed)
At 0003, informed Virgel Manifold, NP fornon productive cough, rhonchi on auscultation. Vital signs as follows: BP=134/94, HR=88, RR=20, T=97.7. At 0036, received a response that pt is under Radiology. Attempted to call Radiology but unable to reach. Rhona Raider, charge RN aware and attempted to call too.   At 0300, pt requesting for cough syrup but still unable to reach Radiology. Will continue to monitor.

## 2022-10-29 NOTE — Progress Notes (Signed)
Dilaudid PCA was discontinued. Wasted remaining 21mg  (21ml) of Dilaudid in the syringe witnessed by Shea Stakes, Charity fundraiser.

## 2022-10-29 NOTE — Discharge Summary (Signed)
Patient ID: CASSEDY GUBA MRN: [id number] DOB/AGE: 49/18/1977 49 y.o.  Admit date: 10/28/2022 Discharge date: 10/29/2022  Supervising Physician: Simonne Come  Patient Status: [facility] - In-pt  Admission Diagnoses: Symptomatic Uterine fibroids   Discharge Diagnoses:  Principal Problem:   Fibroids   Discharged Condition: good  Hospital Course: After consultation with Dr. Miles Costain, pt presented to [facility] and underwent uneventful bilateral uterine artery embolization 10/28/22. Pt stable and currently appropriate for discharge. She will f/u with Dr. Miles Costain in 3-4 weeks.   Pt reports 7/10 sharp, gripping pain to lower abdomen. She has ambulated, urinated and drinking normally. She states she does not have appetite but has tried to eat some breakfast. She denies nausea.  Pt noted to be ambulating in room w/o assistance with husband at bedside. She is A&O, calm and pleasant. She is in no distress.   Significant Diagnostic Studies: radiology  Treatments: surgery: Bilateral uterine fibroid embolization   Discharge Exam: Blood pressure (!) 172/106, pulse (!) 112, temperature 97.7 F (36.5 C), temperature source Oral, resp. rate 20, last menstrual period 04/08/2022, SpO2 90 %.  Physical Exam Vitals reviewed.  Constitutional:      General: She is not in acute distress.    Appearance: Normal appearance. She is not ill-appearing.  HENT:     Head: Normocephalic and atraumatic.  Eyes:     Extraocular Movements: Extraocular movements intact.     Pupils: Pupils are equal, round, and reactive to light.  Cardiovascular:     Rate and Rhythm: Normal rate and regular rhythm.     Pulses: Normal pulses.     Heart sounds: Normal heart sounds.  Pulmonary:     Effort: Pulmonary effort is normal. No respiratory distress.     Breath sounds: Normal breath sounds.  Skin:    General: Skin is warm and dry.     Comments: Bilateral CFA access sites are soft with no active bleeding and no appreciable  pseudoaneurysm. Left dressing is C/D/I. R dressing has small amount dried blood that pt reports was there yesterday.       Neurological:     Mental Status: She is alert.     Disposition:    Allergies as of 10/29/2022       Reactions   Vancomycin Other (See Comments)   "did something to my kidneys," PROGRESSED TO KIDNEY FAILURE!!   Aspirin Other (See Comments)   Wheezing, (Pt states that she just wheezes some when she takes aspirin by itself but she can take aspirin in a combination product).   Contrast Media [iodinated Contrast Media] Other (See Comments)   Multiple CT contrast studies done over 2 weeks caused ARF   Ciprofloxacin Itching, Rash   Cyclobenzaprine Other (See Comments)   Can not tolerate   Farxiga [dapagliflozin] Rash   Sulfa Antibiotics Itching, Rash        Medication List     TAKE these medications    acetaminophen 650 MG CR tablet Commonly known as: TYLENOL Take 650 mg by mouth every 8 (eight) hours as needed for pain (headache).   albuterol 108 (90 Base) MCG/ACT inhaler Commonly known as: VENTOLIN HFA INHALE 1 TO 2 PUFFS BY MOUTH EVERY 6 HOURS AS NEEDED FOR WHEEZING FOR SHORTNESS OF BREATH What changed: See the new instructions.   amiodarone 200 MG tablet Commonly known as: PACERONE Take 1 tablet (200 mg total) by mouth daily.   Ashlyna 0.15-0.03 &0.01 MG tablet Generic drug: Levonorgestrel-Ethinyl Estradiol Take 1 tablet by mouth daily.  budesonide-formoterol 160-4.5 MCG/ACT inhaler Commonly known as: Symbicort Inhale 2 puffs into the lungs 2 (two) times daily as needed. What changed: reasons to take this   Corlanor 7.5 MG Tabs tablet Generic drug: ivabradine TAKE 1 TABLET BY MOUTH TWICE DAILY WITH A MEAL What changed: how much to take   dicyclomine 20 MG tablet Commonly known as: BENTYL Take 1 tablet (20 mg total) by mouth daily as needed for spasms.   docusate sodium 100 MG capsule Commonly known as: Colace Take 1 capsule (100 mg  total) by mouth daily.   empagliflozin 25 MG Tabs tablet Commonly known as: Jardiance Take 1 tablet (25 mg total) by mouth daily before breakfast.   Entresto 97-103 MG Generic drug: sacubitril-valsartan Take 1 tablet by mouth twice daily   fluticasone 50 MCG/ACT nasal spray Commonly known as: FLONASE Place 2 sprays into both nostrils daily as needed for allergies.   gabapentin 300 MG capsule Commonly known as: NEURONTIN Take 1 capsule (300 mg total) by mouth at bedtime as needed (back spasms).   ibuprofen 600 MG tablet Commonly known as: ADVIL Take 1 tablet (600 mg total) by mouth every 6 (six) hours. Take 1 tablet every 6 hours with food regardless of pain for 7 days   lidocaine 5 % Commonly known as: Lidoderm Place 1 patch onto the skin daily. Remove & Discard patch within 12 hours or as directed by MD What changed:  when to take this reasons to take this additional instructions   Magnesium Oxide -Mg Supplement 200 MG Tabs Take 1 tablet (200 mg total) by mouth daily.   metoprolol succinate 100 MG 24 hr tablet Commonly known as: TOPROL-XL Take 1 tablet (100 mg total) by mouth daily. Take with or immediately following a meal.   mexiletine 150 MG capsule Commonly known as: MEXITIL Take 2 capsules (300 mg total) by mouth 2 (two) times daily.   mirtazapine 15 MG tablet Commonly known as: REMERON Take 1 tablet (15 mg total) by mouth at bedtime.   MULTIVITAMIN GUMMIES WOMENS PO Take 2 tablets by mouth daily.   naproxen sodium 220 MG tablet Commonly known as: ALEVE Take 220-440 mg by mouth 2 (two) times daily as needed (Pain).   nitroGLYCERIN 0.4 MG SL tablet Commonly known as: NITROSTAT Place 1 tablet (0.4 mg total) under the tongue every 5 (five) minutes as needed for chest pain.   ondansetron 4 MG tablet Commonly known as: Zofran Take 1 tablet (4 mg total) by mouth every 4 (four) hours.   oxyCODONE 5 MG immediate release tablet Commonly known as:  Roxicodone Take 1 tablet (5 mg total) by mouth every 4 (four) hours as needed for severe pain.   potassium chloride SA 20 MEQ tablet Commonly known as: KLOR-CON M Take 2 tablets (40 mEq total) by mouth daily.   spironolactone 25 MG tablet Commonly known as: ALDACTONE Take 1 tablet (25 mg total) by mouth at bedtime.   tizanidine 2 MG capsule Commonly known as: ZANAFLEX Take 2 mg by mouth at bedtime as needed for muscle spasms.   torsemide 20 MG tablet Commonly known as: DEMADEX Take 1 tablet (20 mg total) by mouth daily. Can take extra 20 mg as needed for swelling   Ubrelvy 100 MG Tabs Generic drug: Ubrogepant Take 100 mg by mouth daily as needed (migraine).   Vitamin D (Ergocalciferol) 1.25 MG (50000 UNIT) Caps capsule Commonly known as: DRISDOL TAKE 1 CAPSULE BY MOUTH EVERY 7 DAYS  Follow-up Information     [facility] Follow up in 4 week(s).   Why: Please f/u with Dr. Miles Costain in 3-4 weeks s/p UFE. Contact information: [address] [place] [address] [phone]                  Electronically Signed: Shon Hough, NP 10/29/2022, 10:54 AM   I have spent Greater Than 30 Minutes discharging ONYINYECHUKWU SHAFF.

## 2022-10-31 ENCOUNTER — Other Ambulatory Visit (HOSPITAL_COMMUNITY): Payer: Self-pay

## 2022-11-02 ENCOUNTER — Telehealth: Payer: Self-pay

## 2022-11-02 ENCOUNTER — Telehealth (HOSPITAL_COMMUNITY): Payer: Self-pay | Admitting: Emergency Medicine

## 2022-11-02 NOTE — Telephone Encounter (Signed)
Attempted to call patient regarding upcoming cardiac PET appointment. Left message on voicemail with name and callback number Aidan Moten RN Navigator Cardiac Imaging [facility] [phone] Office [phone] Cell  

## 2022-11-02 NOTE — Telephone Encounter (Signed)
A Prior Authorization was initiated for this patients LIDOCAINE PATCHES through CoverMyMeds.   Key: [id number]

## 2022-11-03 ENCOUNTER — Telehealth: Payer: Self-pay | Admitting: Internal Medicine

## 2022-11-03 ENCOUNTER — Telehealth (HOSPITAL_COMMUNITY): Payer: Self-pay | Admitting: Emergency Medicine

## 2022-11-03 NOTE — Telephone Encounter (Signed)
Attempted to call patient regarding upcoming cardiac PET appointment. Left message on voicemail with name and callback number Gram Siedlecki RN Navigator Cardiac Imaging [facility] [phone] Office [phone] Cell  

## 2022-11-03 NOTE — Telephone Encounter (Signed)
PA Denied due to not meeting therapeutic criteria: diagnosis of post herpetic neuralgia. Denial letter attached to charts.

## 2022-11-03 NOTE — Telephone Encounter (Signed)
Error

## 2022-11-04 ENCOUNTER — Encounter (HOSPITAL_COMMUNITY)
Admission: RE | Admit: 2022-11-04 | Discharge: 2022-11-04 | Disposition: A | Discharge status: Home / Self Care | Payer: PPO | Source: Ambulatory Visit | Attending: Internal Medicine | Admitting: Internal Medicine

## 2022-11-04 DIAGNOSIS — Z0189 Encounter for other specified special examinations: Secondary | ICD-10-CM | POA: Diagnosis not present

## 2022-11-04 DIAGNOSIS — I5042 Chronic combined systolic (congestive) and diastolic (congestive) heart failure: Secondary | ICD-10-CM | POA: Insufficient documentation

## 2022-11-04 DIAGNOSIS — I10 Essential (primary) hypertension: Secondary | ICD-10-CM | POA: Insufficient documentation

## 2022-11-04 LAB — NM PET CT MYOCARDIAL SARCOIDOSIS: Rest Nuclear Isotope Dose: 24 mCi

## 2022-11-04 MED ORDER — RUBIDIUM RB82 GENERATOR (RUBYFILL)
24.0000 | PACK | Freq: Once | INTRAVENOUS | Status: AC
  Administered 2022-11-04: 24 via INTRAVENOUS

## 2022-11-04 MED ORDER — FLUDEOXYGLUCOSE F - 18 (FDG) INJECTION
9.0300 | Freq: Once | INTRAVENOUS | Status: AC
  Administered 2022-11-04: 9.03 via INTRAVENOUS

## 2022-11-04 NOTE — Progress Notes (Deleted)
  Electrophysiology Office Note:   Date:  11/04/2022  ID:  [name], DOB 1975-11-06, MRN [id number]  Primary Cardiologist: Lesleigh Noe, MD (Inactive) Electrophysiologist: Lewayne Bunting, MD  {Click to update primary MD,subspecialty MD or APP then REFRESH:1}    History of Present Illness:   [name] is a 47 y.o. female with h/o chronic systolic CHF, s/p [facility] ICD, s/p Barostim implantation, frequent PVCs, Morbid obesity s/p gastric sleeve, and DM2 (recurrent despite ablation attempt),  seen today for routine electrophysiology followup.   Since last being seen in our clinic the patient reports doing ***.  she denies chest pain, palpitations, dyspnea, PND, orthopnea, nausea, vomiting, dizziness, syncope, edema, weight gain, or early satiety.   Review of systems complete and found to be negative unless listed in HPI.   Device History: Barostim (standard) implanted 11/08/20 for Chronic systolic CHF Medtronic Single Chamber ICD implanted 2018 for NICM  Studies Reviewed:    ICD Interrogation-  reviewed in detail today,  See PACEART report.  EKG is not ordered today. EKG from 10/24/2022 reviewed which showed NSR at 94 bpm without PVCs   Physical Exam:   VS:  LMP 04/08/2022 (Approximate)    Wt Readings from Last 3 Encounters:  10/28/22 205 lb 0.4 oz (93 kg)  10/24/22 205 lb 0.4 oz (93 kg)  09/08/22 211 lb (95.7 kg)     GEN: Well nourished, well developed in no acute distress NECK: No JVD; No carotid bruits CARDIAC: {EPRHYTHM:28826}, no murmurs, rubs, gallops RESPIRATORY:  Clear to auscultation without rales, wheezing or rhonchi  ABDOMEN: Soft, non-tender, non-distended EXTREMITIES:  No edema; No deformity   ASSESSMENT AND PLAN:    Chronic systolic dysfunction s/p [employer] single chamber ICD  S/p Barostim implantation Echo 11/2021 with LVEF 25-30%, pending update and PET NYHA III symptoms chronically Device at 11.0 ma @ 65 ms chronically. She had stim  described as a "rubber band around her neck" at 12.2. She has had stim described as her "tongue pulling back" at 11.4 and higher  euvolemic today Stable on an appropriate medical regimen Normal ICD function See Pace Art report No changes today  Frequent PVCs Not candidate for re-do ablation here due to location of her PVCs Previous Zio patch with PVC burden ~13%  Optimize GDMT and BB as tolerated.  Continue toprol 100 mg dialy, recently increased by Dr. Gala Romney.  Barostim titration has not been associated with statistically significant hypotension, but if needed, we can turn her back down.  Continue amiodarone 200 mg daily and mexiletine 200 mg BID.  She saw Dr. Ladona Ridgel recently. She has no other good AAD options, not ablation candidate as above.   Disposition:   Follow up with {EPPROVIDERS:28135} {EPFOLLOW UP:28173}   Signed, Graciella Freer, PA-C

## 2022-11-05 ENCOUNTER — Ambulatory Visit: Payer: PPO | Attending: Internal Medicine | Admitting: Student

## 2022-11-05 ENCOUNTER — Encounter: Payer: Self-pay | Admitting: Student

## 2022-11-09 ENCOUNTER — Ambulatory Visit: Payer: PPO | Attending: Internal Medicine

## 2022-11-09 DIAGNOSIS — Z95810 Presence of automatic (implantable) cardiac defibrillator: Secondary | ICD-10-CM

## 2022-11-09 DIAGNOSIS — I5042 Chronic combined systolic (congestive) and diastolic (congestive) heart failure: Secondary | ICD-10-CM

## 2022-11-10 NOTE — Progress Notes (Signed)
EPIC Encounter for ICM Monitoring  Patient Name: [name] is a 47 y.o. female Date: 11/10/2022 Primary Care Physican: Myrlene Broker, MD Primary Cardiologist: Bensimhon Electrophysiologist: Graciela Husbands 11/19/2021 Weight: 214 lbs      04/08/2022 Weight: 190-194 lbs      05/13/2022 Weight: 193 lbs     09/15/2022 Weight: 206 lbs   (baseline closer to 198 lbs)                             Transmission reviewed.    Heartlogic HF Index decreased from 36 to 0 after was diuresed at 5/31 ED visit.        Barostim implant 6/27.   Prescribed: Torsemide 20 mg Take 1 tablet  (20 mg total) by mouth daily. Can take extra 20 mg as needed. Potassium 20 mEq take 2 tablet(s) by mouth daily Spironolactone 25 mg take 1 tablet (25 mg total) at bedtime.    Labs: 07/30/2022 Creatinine 0.85, BUN 6,   Potassium 3.6, Sodium 136, GFR >60 07/03/2022 Creatinine 0.93, BUN 5,   Potassium 3.6, Sodium 136, GFR 76 06/03/2022 Creatinine 0.87, BUN 8,   Potassium 3.8, Sodium 135, GFR >60, BNP 710.3 05/06/2022 Creatinine 1.30, BUN 8,   Potassium 3.2, Sodium 137, GFR 51,   BNP 251.3 05/04/2022 Creatinine 1.07, BUN 9,   Potassium 3.9, Sodium 139  A complete set of results can be found in Results Review.   Recommendations:  No changes.    Follow-up plan: ICM clinic phone appointment on 11/23/2022.  91 day device clinic remote transmission 12/17/2022.              EP/Cardiology next office visit:  Missed 11/05/2022 with Otilio Saber, PA and not rescheduled.  Pt aware she needs to schedule office visit with Dr Leory Plowman.            Copy of ICM check sent to Dr. Graciela Husbands.  3 Month HeartLogicT Heart Failure Index:    8 Day Data Trend:          Karie Soda, RN 11/10/2022 8:02 AM

## 2022-11-13 ENCOUNTER — Ambulatory Visit (INDEPENDENT_AMBULATORY_CARE_PROVIDER_SITE_OTHER): Payer: PPO

## 2022-11-13 ENCOUNTER — Ambulatory Visit
Admission: EM | Admit: 2022-11-13 | Discharge: 2022-11-13 | Disposition: A | Discharge status: Home / Self Care | Payer: PPO | Attending: Emergency Medicine | Admitting: Emergency Medicine

## 2022-11-13 DIAGNOSIS — J069 Acute upper respiratory infection, unspecified: Secondary | ICD-10-CM

## 2022-11-13 DIAGNOSIS — R0602 Shortness of breath: Secondary | ICD-10-CM | POA: Diagnosis not present

## 2022-11-13 MED ORDER — AMOXICILLIN-POT CLAVULANATE 875-125 MG PO TABS: 1.0000 | ORAL_TABLET | Freq: Two times a day (BID) | ORAL | 0 refills | Status: DC

## 2022-11-13 MED ORDER — IPRATROPIUM-ALBUTEROL 0.5-2.5 (3) MG/3ML IN SOLN: 3.0000 mL | Freq: Four times a day (QID) | RESPIRATORY_TRACT | 1 refills | Status: DC | PRN

## 2022-11-13 NOTE — Discharge Instructions (Addendum)
Try saline nasal spray several times a day to help your congestion to drain. Or try using saline irrigation, such as with a neti pot, several times a day while you are sick. Many neti pots come with salt packets premeasured to use to make saline. If you use your own salt, make sure it is kosher salt or sea salt (don't use table salt as it has iodine in it and you don't need that in your nose). Use distilled water to make saline. If you mix your own saline using your own salt, the recipe is 1/4 teaspoon salt in 1 cup warm water. Using saline irrigation can help prevent and treat sinus infections.   Plain Mucinex (ok to use generic guaifenesin) is ok to use and can help congestion thin and drain. If you take Mucinex DM, know the "DM" is a cough medicine.   Work on getting your nasal congestion to drain from your nose instead of down your throat.   If you develop thick green nasal discharge or high fever in the next few days, go ahead and get the prescription for antibiotics filled.

## 2022-11-13 NOTE — ED Provider Notes (Signed)
[facility]    CSN: [id number] Arrival date & time: 11/13/22  1338      History   Chief Complaint Chief Complaint  Patient presents with   Cough    Long last cough and chest pain. Headache and inability to sleep peacefully. - Entered by patient    HPI [name] is a 47 y.o. female. Pt reports she has chest congestion, can't sleep, cough, and headache x 5 days. Started with fever on first day of illness but no fever since. Does have chills. Has tried theraflu, mucinex dm, tessalon pearls, and inhaler  but no relief. Was worried it was her HF and maybe she had pulm edema so she took extra diuretics but it did not change her symptoms. Does not feel SOB. No weight gain. Denies peripheral edema. Feels congested and lots of post nasal drainage so is not able to lie down to sleep and actually rest as she gags on drainage.   HPI  Past Medical History:  Diagnosis Date   Acute on chronic systolic (congestive) heart failure (HCC) 04/29/2017   Acute pain of right shoulder 06/16/2017   AICD (automatic cardioverter/defibrillator) present    Anginal pain (HCC)    Anxiety    Arthritis    right shoulder    Asthma    CHF (congestive heart failure) (HCC)    Chronic combined systolic and diastolic heart failure (HCC) 03/05/2014   Closed low lateral malleolus fracture 10/23/2013   Cystitis 10/21/2017   Depression    Depression with anxiety 01/20/2013   Diabetes mellitus without complication (HCC)    Diverticulosis    Dyspnea    comes and goes intermittently mostly with exertion    Dysrhythmia    Essential hypertension    Prev followed by [name]    Fibroid    age 28   Gallstones    Generalized abdominal cramping    History of cardiomyopathy    Hypertension    IBS (irritable bowel syndrome)    ICD (implantable cardioverter-defibrillator), single, 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 CPAP intolerance >> "smothering" Good tolerance of auto device (optimal pressure 12-13 on download).  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   PVC (premature ventricular contraction) 06/23/2016   Seizures (HCC)    "as a child" (08/03/2016)   Termination of pregnancy    due to cardiac risk    Patient Active Problem List   Diagnosis Date Noted   Uterine leiomyoma 10/28/2022   Fibroid 10/28/2022   Fibroids 10/28/2022   Encounter for general adult medical examination with abnormal findings 05/04/2022   GERD (gastroesophageal reflux disease) 03/10/2022   Right wrist pain 04/11/2021   Morbid obesity (HCC) 02/28/2021   Greater trochanteric bursitis of right hip 12/26/2020   Congestive heart failure, NYHA class III (HCC) 11/08/2020  Asthma 08/25/2020   Iron deficiency anemia due to chronic blood loss 08/06/2020   Greater trochanteric bursitis, left 07/01/2020   V-tach (HCC) 11/12/2019   Headache 10/26/2018   Chronic right-sided low back pain without sciatica 06/24/2018   Patellar subluxation, left, initial encounter 04/25/2018   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   Insomnia 05/07/2017   ICD (implantable cardioverter-defibrillator), single, 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   Patellofemoral syndrome of both knees 10/16/2016   NICM (nonischemic cardiomyopathy) (HCC) 08/03/2016   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   Labral tear of shoulder 04/04/2015    Chronic combined systolic and diastolic heart failure (HCC) 03/05/2014   IBS (irritable bowel syndrome)    MDD (major depressive disorder) 01/20/2013   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   Essential hypertension     Past Surgical History:  Procedure Laterality Date   BREAST BIOPSY Right 09/30/2022   [place] RT BREAST BX W LOC DEV 1ST LESION IMG BX SPEC US GUIDE 09/30/2022 [facility]   CARDIAC CATHETERIZATION N/A 11/11/2015   Procedure: Right/Left Heart Cath and Coronary Angiography;  Surgeon: Lennette Bihari, MD;  Location: [facility];  Service: Cardiovascular;  Laterality: N/A;   CARDIAC CATHETERIZATION  ~ 2015   CARDIAC DEFIBRILLATOR PLACEMENT  08/03/2016   CESAREAN SECTION  1999   COLONOSCOPY WITH PROPOFOL N/A 04/21/2016   Procedure: COLONOSCOPY WITH PROPOFOL;  Surgeon: Beverley Fiedler, MD;  Location: [facility];  Service: Gastroenterology;  Laterality: N/A;   ESOPHAGOGASTRODUODENOSCOPY (EGD) WITH PROPOFOL N/A 04/21/2016   Procedure: ESOPHAGOGASTRODUODENOSCOPY (EGD) WITH PROPOFOL;  Surgeon: Beverley Fiedler, MD;  Location: [facility];  Service: Gastroenterology;  Laterality: N/A;   FOOT FRACTURE SURGERY Right ~ 2003   FRACTURE SURGERY     ICD IMPLANT N/A 08/03/2016   Procedure: ICD Implant;  Surgeon: Duke Salvia, MD;  Location: [facility]VAS[facility] CV LAB;  Service: Cardiovascular;  Laterality: N/A;   IR ANGIOGRAM PELVIS SELECTIVE OR SUPRASELECTIVE  10/28/2022   IR ANGIOGRAM SELECTIVE EACH ADDITIONAL VESSEL  10/28/2022   IR ANGIOGRAM SELECTIVE EACH ADDITIONAL VESSEL  10/28/2022   IR ANGIOGRAM SELECTIVE EACH ADDITIONAL VESSEL  10/28/2022   IR EMBO TUMOR ORGAN ISCHEMIA INFARCT INC GUIDE ROADMAPPING  10/28/2022   IR US GUIDE VASC ACCESS LEFT  10/28/2022   IR US GUIDE VASC ACCESS RIGHT  10/28/2022   LAPAROSCOPIC CHOLECYSTECTOMY  12/2006   LAPAROSCOPIC GASTRIC SLEEVE RESECTION     LAPAROSCOPY ABDOMEN DIAGNOSTIC  2008   "cut bile duct w/gallbladder OR; had to go in later & fix leak; hospitalized for 2  months"   LEFT HEART CATHETERIZATION WITH CORONARY ANGIOGRAM N/A 02/26/2014   Procedure: LEFT HEART CATHETERIZATION WITH CORONARY ANGIOGRAM;  Surgeon: Corky Crafts, MD;  Location: [facility] CATH LAB;  Service: Cardiovascular;  Laterality: N/A;   OVARIAN CYST REMOVAL Right 1999   PVC ABLATION N/A 12/01/2017   Procedure: PVC ABLATION;  Surgeon: Marinus Maw, MD;  Location: [facility];  Service: Cardiovascular;  Laterality: N/A;   RIGHT HEART CATH N/A 08/05/2017   Procedure: RIGHT HEART CATH;  Surgeon: Dolores Patty, MD;  Location: [facility];  Service: Cardiovascular;  Laterality: N/A;   RIGHT HEART CATH N/A 11/16/2019   Procedure: RIGHT HEART CATH;  Surgeon: Dolores Patty, MD;  Location: [facility];  Service: Cardiovascular;  Laterality: N/A;   TUBAL LIGATION  1999    OB History     Gravida  2   Para  1   Term  Preterm      AB  1   Living  1      SAB      IAB  1   Ectopic      Multiple      Live Births               Home Medications    Prior to Admission medications   Medication Sig Start Date End Date Taking? Authorizing Provider  amoxicillin-clavulanate (AUGMENTIN) 875-125 MG tablet Take 1 tablet by mouth 2 (two) times daily. 11/13/22  Yes Cathlyn Parsons, NP  ipratropium-albuterol (DUONEB) 0.5-2.5 (3) MG/3ML SOLN Take 3 mLs by nebulization every 6 (six) hours as needed. 11/13/22  Yes Cathlyn Parsons, NP  acetaminophen (TYLENOL) 650 MG CR tablet Take 650 mg by mouth every 8 (eight) hours as needed for pain (headache).    [provider]  albuterol (VENTOLIN HFA) 108 (90 Base) MCG/ACT inhaler INHALE 1 TO 2 PUFFS BY MOUTH EVERY 6 HOURS AS NEEDED FOR WHEEZING FOR SHORTNESS OF BREATH Patient taking differently: Inhale 2 puffs into the lungs every 6 (six) hours as needed for wheezing or shortness of breath. 06/08/22   Myrlene Broker, MD  amiodarone (PACERONE) 200 MG tablet Take 1 tablet (200 mg total) by mouth  daily. 02/04/21   Graciella Freer, PA-C  ASHLYNA 0.15-0.03 &0.01 MG tablet Take 1 tablet by mouth daily. 08/01/22   [provider]  budesonide-formoterol (SYMBICORT) 160-4.5 MCG/ACT inhaler Inhale 2 puffs into the lungs 2 (two) times daily as needed. Patient taking differently: Inhale 2 puffs into the lungs 2 (two) times daily as needed (Asthma). 03/13/22   Myrlene Broker, MD  CORLANOR 7.5 MG TABS tablet TAKE 1 TABLET BY MOUTH TWICE DAILY WITH A MEAL Patient taking differently: Take 7.5 mg by mouth 2 (two) times daily with a meal. 02/16/22   Bensimhon, Bevelyn Buckles, MD  dicyclomine (BENTYL) 20 MG tablet Take 1 tablet (20 mg total) by mouth daily as needed for spasms. 05/30/20   Myrlene Broker, MD  docusate sodium (COLACE) 100 MG capsule Take 1 capsule (100 mg total) by mouth daily. 10/29/22   Shon Hough, NP  empagliflozin (JARDIANCE) 25 MG TABS tablet Take 1 tablet (25 mg total) by mouth daily before breakfast. 03/10/22   Myrlene Broker, MD  fluticasone [facility]) 50 MCG/ACT nasal spray Place 2 sprays into both nostrils daily as needed for allergies. 03/13/22   Myrlene Broker, MD  gabapentin (NEURONTIN) 300 MG capsule Take 1 capsule (300 mg total) by mouth at bedtime as needed (back spasms). 05/30/20   Myrlene Broker, MD  ibuprofen (ADVIL) 600 MG tablet Take 1 tablet (600 mg total) by mouth every 6 (six) hours. Take 1 tablet every 6 hours with food regardless of pain for 7 days 10/29/22   Shon Hough, NP  lidocaine (LIDODERM) 5 % Place 1 patch onto the skin daily. Remove & Discard patch within 12 hours or as directed by MD Patient taking differently: Place 1 patch onto the skin daily as needed (Pain). 10/23/22   Myrlene Broker, MD  Magnesium Oxide 200 MG TABS Take 1 tablet (200 mg total) by mouth daily. 05/30/20   Bensimhon, Bevelyn Buckles, MD  metoprolol succinate (TOPROL-XL) 100 MG 24 hr tablet Take 1 tablet (100 mg total) by mouth daily. Take with or  immediately following a meal. 07/31/22   Bensimhon, Bevelyn Buckles, MD  mexiletine (MEXITIL) 150 MG capsule Take 2 capsules (  300 mg total) by mouth 2 (two) times daily. 06/03/22   Milford, Anderson Malta, FNP  mirtazapine (REMERON) 15 MG tablet Take 1 tablet (15 mg total) by mouth at bedtime. 08/18/22   Myrlene Broker, MD  Multiple Vitamins-Minerals (MULTIVITAMIN GUMMIES WOMENS PO) Take 2 tablets by mouth daily.    [provider]  naproxen sodium (ALEVE) 220 MG tablet Take 220-440 mg by mouth 2 (two) times daily as needed (Pain).    [provider]  nitroGLYCERIN (NITROSTAT) 0.4 MG SL tablet Place 1 tablet (0.4 mg total) under the tongue every 5 (five) minutes as needed for chest pain. 06/20/21   Bensimhon, Bevelyn Buckles, MD  ondansetron (ZOFRAN) 4 MG tablet Take 1 tablet (4 mg total) by mouth every 4 (four) hours. 10/29/22   Shon Hough, NP  oxyCODONE (ROXICODONE) 5 MG immediate release tablet Take 1 tablet (5 mg total) by mouth every 4 (four) hours as needed for severe pain. 10/29/22   Shon Hough, NP  potassium chloride SA (KLOR-CON M) 20 MEQ tablet Take 2 tablets (40 mEq total) by mouth daily. 07/21/22   Bensimhon, Bevelyn Buckles, MD  sacubitril-valsartan (ENTRESTO) 97-103 MG Take 1 tablet by mouth twice daily 08/31/22   Bensimhon, Bevelyn Buckles, MD  spironolactone (ALDACTONE) 25 MG tablet Take 1 tablet (25 mg total) by mouth at bedtime. 01/14/21   Bensimhon, Bevelyn Buckles, MD  tizanidine (ZANAFLEX) 2 MG capsule Take 2 mg by mouth at bedtime as needed for muscle spasms.    [provider]  torsemide (DEMADEX) 20 MG tablet Take 1 tablet (20 mg total) by mouth daily. Can take extra 20 mg as needed for swelling 06/08/22   Milford, [place], FNP  Ubrogepant (UBRELVY) 100 MG TABS Take 100 mg by mouth daily as needed (migraine). 06/11/21   Myrlene Broker, MD  Vitamin D, Ergocalciferol, (DRISDOL) 1.25 MG (50000 UNIT) CAPS capsule TAKE 1 CAPSULE BY MOUTH EVERY 7 DAYS 06/02/22   Myrlene Broker, MD     Family History Family History  Problem Relation Age of Onset   Emphysema Maternal Grandmother        smoked   Heart disease Maternal Grandmother 67       MI   Rheum arthritis Mother    Allergies Daughter    Colon cancer Neg Hx     Social History Social History   Tobacco Use   Smoking status: Never   Smokeless tobacco: Never  Vaping Use   Vaping Use: Never used  Substance Use Topics   Alcohol use: No   Drug use: No     Allergies   Vancomycin, Aspirin, Contrast media [iodinated contrast media], Ciprofloxacin, Cyclobenzaprine, Farxiga [dapagliflozin], and Sulfa antibiotics   Review of Systems Review of Systems   Physical Exam Triage Vital Signs ED Triage Vitals  Enc Vitals Group     BP 11/13/22 1411 119/79     Pulse Rate 11/13/22 1411 (!) 47     Resp 11/13/22 1411 20     Temp 11/13/22 1411 98.6 F (37 C)     Temp Source 11/13/22 1411 Oral     SpO2 11/13/22 1411 97 %     Weight --      Height --      Head Circumference --      Peak Flow --      Pain Score 11/13/22 1413 5     Pain Loc --      Pain Edu? --  Excl. in GC? --    No data found.  Updated Vital Signs BP 119/79 (BP Location: Left Arm)   Pulse (!) 47   Temp 98.6 F (37 C) (Oral)   Resp 20   LMP 04/08/2022 (Approximate)   SpO2 97%   Visual Acuity Right Eye Distance:   Left Eye Distance:   Bilateral Distance:    Right Eye Near:   Left Eye Near:    Bilateral Near:     Physical Exam   UC Treatments / Results  Labs (all labs ordered are listed, but only abnormal results are displayed) Labs Reviewed - No data to display  EKG   Radiology DG Chest 2 View  Result Date: 11/13/2022 CLINICAL DATA:  Shortness of breath. EXAM: CHEST - 2 VIEW COMPARISON:  Oct 23, 2022. FINDINGS: The heart size and mediastinal contours are within normal limits. Stable position of left-sided fibrillated. Stable position of stimulator device over right chest. Both lungs are clear. The visualized  skeletal structures are unremarkable. IMPRESSION: No active cardiopulmonary disease. Electronically Signed   By: Lupita Raider M.D.   On: 11/13/2022 15:12    Procedures Procedures (including critical care time)  Medications Ordered in UC Medications - No data to display  Initial Impression / Assessment and Plan / UC Course  I have reviewed the triage vital signs and the nursing notes.  Pertinent labs & imaging results that were available during my care of the patient were reviewed by me and considered in my medical decision making (see chart for details).     *** Final Clinical Impressions(s) / UC Diagnoses   Final diagnoses:  Upper respiratory tract infection, unspecified type     Discharge Instructions      Try saline nasal spray several times a day to help your congestion to drain. Or try using saline irrigation, such as with a neti pot, several times a day while you are sick. Many neti pots come with salt packets premeasured to use to make saline. If you use your own salt, make sure it is kosher salt or sea salt (don't use table salt as it has iodine in it and you don't need that in your nose). Use distilled water to make saline. If you mix your own saline using your own salt, the recipe is 1/4 teaspoon salt in 1 cup warm water. Using saline irrigation can help prevent and treat sinus infections.   Plain Mucinex (ok to use generic guaifenesin) is ok to use and can help congestion thin and drain. If you take Mucinex DM, know the "DM" is a cough medicine.   Work on getting your nasal congestion to drain from your nose instead of down your throat.   If you develop thick green nasal discharge or high fever in the next few days, go ahead and get the prescription for antibiotics filled.    ED Prescriptions     Medication Sig Dispense Auth. Provider   ipratropium-albuterol (DUONEB) 0.5-2.5 (3) MG/3ML SOLN Take 3 mLs by nebulization every 6 (six) hours as needed. 360 mL Cathlyn Parsons, NP   amoxicillin-clavulanate (AUGMENTIN) 875-125 MG tablet Take 1 tablet by mouth 2 (two) times daily. 14 tablet Cathlyn Parsons, NP      PDMP not reviewed this encounter.

## 2022-11-13 NOTE — ED Triage Notes (Signed)
Pt reports she has chest congestion, can't sleep, cough, and headache x 5 days. Started with fever.  Took theraflu, mucinex dm, tessalon pearls, and inhaler  but no relief.

## 2022-11-16 ENCOUNTER — Other Ambulatory Visit: Payer: Self-pay | Admitting: Internal Medicine

## 2022-11-16 ENCOUNTER — Encounter (HOSPITAL_COMMUNITY): Payer: Self-pay | Admitting: Cardiology

## 2022-11-16 MED ORDER — TIZANIDINE HCL 2 MG PO CAPS: 2.0000 mg | ORAL_CAPSULE | Freq: Every evening | ORAL | 0 refills | Status: DC | PRN

## 2022-11-19 ENCOUNTER — Other Ambulatory Visit (HOSPITAL_COMMUNITY): Payer: Self-pay

## 2022-11-19 ENCOUNTER — Telehealth: Payer: Self-pay | Admitting: Pharmacy Technician

## 2022-11-19 NOTE — Telephone Encounter (Signed)
MD sent rx already on 11/16/22..Raechel Chute   tizanidine (ZANAFLEX) 2 MG capsule 2 mg, At bedtime PRN 0 ordered       Summary: Take 1 capsule (2 mg total) by mouth at bedtime as needed for muscle spasms., Starting Mon 11/16/2022, Normal Dose, Route, Frequency: 2 mg, Oral, At bedtime PRNStart: 06/24/2024Ord/Sold: 11/16/2022 (O)Ordered On: 06/24/2024Pharmacy: [facility] - [place] - [address]: Dx Associated: Taking: Long-term: Med Note:        Change Discontinue Patient Sig: Take 1 capsule (2 mg total) by mouth at bedtime as needed for muscle spasms. Authorized By: Myrlene Broker, MD Dispense: 60 capsule

## 2022-11-19 NOTE — Telephone Encounter (Signed)
Pharmacy Patient Advocate Encounter   Received notification from CoverMyMeds that prior authorization for tiZANidine HCl 2MG  capsules is required/requested.   Insurance verification completed.   The patient is insured through [employer]/ [employer] .   Per test claim: tiZANidine HCl 2MG  tablets is preferred by the insurance.  If suggested medication is appropriate,  Please send in a new RX and discontinue this one.  If not, please advise as to why it's not appropriate so taht we may request a Prior Authorization.

## 2022-11-23 ENCOUNTER — Ambulatory Visit (INDEPENDENT_AMBULATORY_CARE_PROVIDER_SITE_OTHER): Payer: PPO

## 2022-11-23 DIAGNOSIS — I5042 Chronic combined systolic (congestive) and diastolic (congestive) heart failure: Secondary | ICD-10-CM

## 2022-11-23 DIAGNOSIS — Z95810 Presence of automatic (implantable) cardiac defibrillator: Secondary | ICD-10-CM

## 2022-11-27 NOTE — Progress Notes (Signed)
EPIC Encounter for ICM Monitoring  Patient Name: [name] is a 47 y.o. female Date: 11/27/2022 Primary Care Physican: Myrlene Broker, MD Primary Cardiologist: Bensimhon Electrophysiologist: Graciela Husbands 11/19/2021 Weight: 214 lbs      04/08/2022 Weight: 190-194 lbs      05/13/2022 Weight: 193 lbs     09/15/2022 Weight: 206 lbs   (baseline closer to 198 lbs)      11/27/2022 Weight: 205 lbs                        Spoke with patient and heart failure questions reviewed.  Transmission results reviewed.  Pt asymptomatic for fluid accumulation and feeling fine.  She ate [employer] for 2 days on 7/1 & 7/2 which is typically high in salt.  3 ED visits since 5/31.   Heartlogic HF Index 20 suggesting possible fluid accumulation starting 7/2.   Thoracic impedance trending low.       Barostim implant 6/27.   Prescribed: Torsemide 20 mg Take 1 tablet  (20 mg total) by mouth daily. Can take extra 20 mg as needed. Potassium 20 mEq take 2 tablet(s) by mouth daily Spironolactone 25 mg take 1 tablet (25 mg total) at bedtime.    Labs: 10/28/2022 Creatinine 0.83, BUN 9,   Potassium 3.1, Sodium 136, GFR >60 10/23/2022 Creatinine 0.98, BUN 5,   Potassium 3.4, Sodium 137, GFR >60 07/30/2022 Creatinine 0.85, BUN 6,   Potassium 3.6, Sodium 136, GFR >60 07/03/2022 Creatinine 0.93, BUN 5,   Potassium 3.6, Sodium 136, GFR 76 06/03/2022 Creatinine 0.87, BUN 8,   Potassium 3.8, Sodium 135, GFR >60, BNP 710.3 05/06/2022 Creatinine 1.30, BUN 8,   Potassium 3.2, Sodium 137, GFR 51,   BNP 251.3 05/04/2022 Creatinine 1.07, BUN 9,   Potassium 3.9, Sodium 139  A complete set of results can be found in Results Review.   Recommendations:  She will take an extra Torsemide 1 tablet x 2-3 days.   Advised to call offices of [facility], [place] and Dr Doralee Albino to schedule appointments.   Follow-up plan: ICM clinic phone appointment on 12/07/2022 to recheck fluid levels.  91 day device clinic remote transmission  12/17/2022.              EP/Cardiology next office visit:  Missed 11/05/2022 with Otilio Saber, PA and not rescheduled.  Pt aware she needs to schedule office visit with Dr Leory Plowman.            Copy of ICM check sent to Dr. Graciela Husbands and Dr Gala Romney as Lorain Childes.   3 Month HeartLogicT Heart Failure Index:    8 Day Data Trend:          Karie Soda, RN 11/27/2022 7:19 AM

## 2022-12-09 ENCOUNTER — Other Ambulatory Visit: Payer: Self-pay | Admitting: Internal Medicine

## 2022-12-13 ENCOUNTER — Ambulatory Visit
Admission: EM | Admit: 2022-12-13 | Discharge: 2022-12-13 | Disposition: A | Discharge status: Home / Self Care | Payer: PPO | Attending: Physician Assistant | Admitting: Physician Assistant

## 2022-12-13 ENCOUNTER — Other Ambulatory Visit: Payer: Self-pay

## 2022-12-13 ENCOUNTER — Ambulatory Visit (INDEPENDENT_AMBULATORY_CARE_PROVIDER_SITE_OTHER): Payer: PPO

## 2022-12-13 ENCOUNTER — Encounter: Payer: Self-pay | Admitting: Emergency Medicine

## 2022-12-13 DIAGNOSIS — M79671 Pain in right foot: Secondary | ICD-10-CM

## 2022-12-13 NOTE — ED Provider Notes (Signed)
[facility]    CSN: [id number] Arrival date & time: 12/13/22  1314      History   Chief Complaint Chief Complaint  Patient presents with   Foot Pain    HPI [name] is a 47 y.o. female.   Patient here today for evaluation of pain and swelling to her right foot that started 6 days ago.  She states that she has some radiation of pain from her foot into her ankle at medial malleolus.  She has had multiple fractures of her right foot as well as fractures of her left ankle in the past.  She denies any numbness or tingling.  She states that weightbearing worsens the pain.  She denies any known injury.  The history is provided by the patient.  Foot Pain    Past Medical History:  Diagnosis Date   Acute on chronic systolic (congestive) heart failure (HCC) 04/29/2017   Acute pain of right shoulder 06/16/2017   AICD (automatic cardioverter/defibrillator) present    Anginal pain (HCC)    Anxiety    Arthritis    right shoulder    Asthma    CHF (congestive heart failure) (HCC)    Chronic combined systolic and diastolic heart failure (HCC) 03/05/2014   Closed low lateral malleolus fracture 10/23/2013   Cystitis 10/21/2017   Depression    Depression with anxiety 01/20/2013   Diabetes mellitus without complication (HCC)    Diverticulosis    Dyspnea    comes and goes intermittently mostly with exertion    Dysrhythmia    Essential hypertension    Prev followed by [name]    Fibroid    age 36   Gallstones    Generalized abdominal cramping    History of cardiomyopathy    Hypertension    IBS (irritable bowel syndrome)    ICD (implantable cardioverter-defibrillator), single, 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 CPAP intolerance >> "smothering" Good tolerance of auto device (optimal pressure 12-13 on download).  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   PVC (premature ventricular contraction) 06/23/2016   Seizures (HCC)    "as a child" (08/03/2016)   Termination of pregnancy    due to cardiac risk    Patient Active Problem List   Diagnosis Date Noted   Uterine leiomyoma 10/28/2022   Fibroid 10/28/2022   Fibroids 10/28/2022   Encounter for general adult medical examination with abnormal findings 05/04/2022   GERD (gastroesophageal reflux disease) 03/10/2022   Right wrist pain 04/11/2021   Morbid obesity (HCC) 02/28/2021   Greater trochanteric bursitis of right hip 12/26/2020   Congestive heart failure, NYHA class III (HCC) 11/08/2020   Asthma 08/25/2020   Iron deficiency anemia due to chronic blood loss 08/06/2020   Greater trochanteric bursitis, left 07/01/2020   V-tach (HCC) 11/12/2019  Headache 10/26/2018   Chronic right-sided low back pain without sciatica 06/24/2018   Patellar subluxation, left, initial encounter 04/25/2018   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   Insomnia 05/07/2017   ICD (implantable cardioverter-defibrillator), single, 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   Patellofemoral syndrome of both knees 10/16/2016   NICM (nonischemic cardiomyopathy) (HCC) 08/03/2016   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   Labral tear of shoulder 04/04/2015   Chronic combined systolic and diastolic heart failure (HCC) 03/05/2014   IBS (irritable bowel syndrome)    MDD (major depressive disorder) 01/20/2013    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   Essential hypertension     Past Surgical History:  Procedure Laterality Date   BREAST BIOPSY Right 09/30/2022   [place] RT BREAST BX W LOC DEV 1ST LESION IMG BX SPEC US GUIDE 09/30/2022 [facility]   CARDIAC CATHETERIZATION N/A 11/11/2015   Procedure: Right/Left Heart Cath and Coronary Angiography;  Surgeon: Lennette Bihari, MD;  Location: [facility];  Service: Cardiovascular;  Laterality: N/A;   CARDIAC CATHETERIZATION  ~ 2015   CARDIAC DEFIBRILLATOR PLACEMENT  08/03/2016   CESAREAN SECTION  1999   COLONOSCOPY WITH PROPOFOL N/A 04/21/2016   Procedure: COLONOSCOPY WITH PROPOFOL;  Surgeon: Beverley Fiedler, MD;  Location: [facility];  Service: Gastroenterology;  Laterality: N/A;   ESOPHAGOGASTRODUODENOSCOPY (EGD) WITH PROPOFOL N/A 04/21/2016   Procedure: ESOPHAGOGASTRODUODENOSCOPY (EGD) WITH PROPOFOL;  Surgeon: Beverley Fiedler, MD;  Location: [facility];  Service: Gastroenterology;  Laterality: N/A;   FOOT FRACTURE SURGERY Right ~ 2003   FRACTURE SURGERY     ICD IMPLANT N/A 08/03/2016   Procedure: ICD Implant;  Surgeon: Duke Salvia, MD;  Location: [facility] INVASIVE CV LAB;  Service: Cardiovascular;  Laterality: N/A;   IR ANGIOGRAM PELVIS SELECTIVE OR SUPRASELECTIVE  10/28/2022   IR ANGIOGRAM SELECTIVE EACH ADDITIONAL VESSEL  10/28/2022   IR ANGIOGRAM SELECTIVE EACH ADDITIONAL VESSEL  10/28/2022   IR ANGIOGRAM SELECTIVE EACH ADDITIONAL VESSEL  10/28/2022   IR EMBO TUMOR ORGAN ISCHEMIA INFARCT INC GUIDE ROADMAPPING  10/28/2022   IR US GUIDE VASC ACCESS LEFT  10/28/2022   IR US GUIDE VASC ACCESS RIGHT  10/28/2022   LAPAROSCOPIC CHOLECYSTECTOMY  12/2006   LAPAROSCOPIC GASTRIC SLEEVE RESECTION     LAPAROSCOPY ABDOMEN DIAGNOSTIC  2008   "cut bile duct w/gallbladder OR; had to go in later & fix leak; hospitalized for 2 months"   LEFT HEART CATHETERIZATION WITH CORONARY ANGIOGRAM N/A 02/26/2014   Procedure: LEFT HEART CATHETERIZATION WITH CORONARY ANGIOGRAM;  Surgeon:  Corky Crafts, MD;  Location: [facility] CATH LAB;  Service: Cardiovascular;  Laterality: N/A;   OVARIAN CYST REMOVAL Right 1999   PVC ABLATION N/A 12/01/2017   Procedure: PVC ABLATION;  Surgeon: Marinus Maw, MD;  Location: [facility];  Service: Cardiovascular;  Laterality: N/A;   RIGHT HEART CATH N/A 08/05/2017   Procedure: RIGHT HEART CATH;  Surgeon: Dolores Patty, MD;  Location: [facility];  Service: Cardiovascular;  Laterality: N/A;   RIGHT HEART CATH N/A 11/16/2019   Procedure: RIGHT HEART CATH;  Surgeon: Dolores Patty, MD;  Location: [facility];  Service: Cardiovascular;  Laterality: N/A;   TUBAL LIGATION  1999    OB History     Gravida  2   Para  1   Term      Preterm      AB  1   Living  1      SAB      IAB  1   Ectopic      Multiple      Live Births               Home Medications    Prior to Admission medications   Medication Sig Start Date End Date Taking? Authorizing Provider  acetaminophen (TYLENOL) 650 MG CR tablet Take 650 mg by mouth every 8 (eight) hours as needed for pain (headache).    [provider]  albuterol (VENTOLIN HFA) 108 (90 Base) MCG/ACT inhaler INHALE 1 TO 2 PUFFS BY MOUTH EVERY 6 HOURS AS NEEDED FOR WHEEZING FOR SHORTNESS OF BREATH 12/10/22   Myrlene Broker, MD  amiodarone (PACERONE) 200 MG tablet Take 1 tablet (200 mg total) by mouth daily. 02/04/21   Graciella Freer, PA-C  amoxicillin-clavulanate (AUGMENTIN) 875-125 MG tablet Take 1 tablet by mouth 2 (two) times daily. 11/13/22   Cathlyn Parsons, NP  ASHLYNA 0.15-0.03 &0.01 MG tablet Take 1 tablet by mouth daily. 08/01/22   [provider]  budesonide-formoterol (SYMBICORT) 160-4.5 MCG/ACT inhaler Inhale 2 puffs into the lungs 2 (two) times daily as needed. Patient taking differently: Inhale 2 puffs into the lungs 2 (two) times daily as needed (Asthma). 03/13/22   Myrlene Broker, MD  CORLANOR 7.5 MG TABS tablet  TAKE 1 TABLET BY MOUTH TWICE DAILY WITH A MEAL Patient taking differently: Take 7.5 mg by mouth 2 (two) times daily with a meal. 02/16/22   Bensimhon, Bevelyn Buckles, MD  dicyclomine (BENTYL) 20 MG tablet Take 1 tablet (20 mg total) by mouth daily as needed for spasms. 05/30/20   Myrlene Broker, MD  docusate sodium (COLACE) 100 MG capsule Take 1 capsule (100 mg total) by mouth daily. 10/29/22   Shon Hough, NP  empagliflozin (JARDIANCE) 25 MG TABS tablet Take 1 tablet (25 mg total) by mouth daily before breakfast. 03/10/22   Myrlene Broker, MD  fluticasone [facility]) 50 MCG/ACT nasal spray Place 2 sprays into both nostrils daily as needed for allergies. 03/13/22   Myrlene Broker, MD  gabapentin (NEURONTIN) 300 MG capsule Take 1 capsule (300 mg total) by mouth at bedtime as needed (back spasms). 05/30/20   Myrlene Broker, MD  ibuprofen (ADVIL) 600 MG tablet Take 1 tablet (600 mg total) by mouth every 6 (six) hours. Take 1 tablet every 6 hours with food regardless of pain for 7 days 10/29/22   Alex Gardener T, NP  ipratropium-albuterol (DUONEB) 0.5-2.5 (3) MG/3ML SOLN Take 3 mLs by nebulization every 6 (six) hours as needed. 11/13/22   Cathlyn Parsons, NP  lidocaine (LIDODERM) 5 % Place 1 patch onto the skin daily. Remove & Discard patch within 12 hours or as directed by MD Patient taking differently: Place 1 patch onto the skin daily as needed (Pain). 10/23/22   Myrlene Broker, MD  Magnesium Oxide 200 MG TABS Take 1 tablet (200 mg total) by mouth daily. 05/30/20   Bensimhon, Bevelyn Buckles, MD  metoprolol succinate (TOPROL-XL) 100 MG 24 hr tablet Take 1 tablet (100 mg total) by mouth daily. Take with or immediately following a meal. 07/31/22   Bensimhon, Bevelyn Buckles, MD  mexiletine (MEXITIL) 150 MG capsule Take 2 capsules (300 mg total) by mouth 2 (two) times daily. 06/03/22   [place], Anderson Malta, FNP  mirtazapine (REMERON) 15 MG tablet Take 1 tablet (15 mg total) by mouth at bedtime. 08/18/22    Myrlene Broker, MD  Multiple Vitamins-Minerals (MULTIVITAMIN GUMMIES WOMENS PO) Take 2  tablets by mouth daily.    [provider]  naproxen sodium (ALEVE) 220 MG tablet Take 220-440 mg by mouth 2 (two) times daily as needed (Pain).    [provider]  nitroGLYCERIN (NITROSTAT) 0.4 MG SL tablet Place 1 tablet (0.4 mg total) under the tongue every 5 (five) minutes as needed for chest pain. 06/20/21   Bensimhon, Bevelyn Buckles, MD  ondansetron (ZOFRAN) 4 MG tablet Take 1 tablet (4 mg total) by mouth every 4 (four) hours. 10/29/22   Shon Hough, NP  oxyCODONE (ROXICODONE) 5 MG immediate release tablet Take 1 tablet (5 mg total) by mouth every 4 (four) hours as needed for severe pain. 10/29/22   Shon Hough, NP  potassium chloride SA (KLOR-CON M) 20 MEQ tablet Take 2 tablets (40 mEq total) by mouth daily. 07/21/22   Bensimhon, Bevelyn Buckles, MD  sacubitril-valsartan (ENTRESTO) 97-103 MG Take 1 tablet by mouth twice daily 08/31/22   Bensimhon, Bevelyn Buckles, MD  spironolactone (ALDACTONE) 25 MG tablet Take 1 tablet (25 mg total) by mouth at bedtime. 01/14/21   Bensimhon, Bevelyn Buckles, MD  tizanidine (ZANAFLEX) 2 MG capsule Take 1 capsule (2 mg total) by mouth at bedtime as needed for muscle spasms. 11/16/22   Myrlene Broker, MD  torsemide (DEMADEX) 20 MG tablet Take 1 tablet (20 mg total) by mouth daily. Can take extra 20 mg as needed for swelling 06/08/22   [place], [place], FNP  Ubrogepant (UBRELVY) 100 MG TABS Take 100 mg by mouth daily as needed (migraine). 06/11/21   Myrlene Broker, MD  Vitamin D, Ergocalciferol, (DRISDOL) 1.25 MG (50000 UNIT) CAPS capsule TAKE 1 CAPSULE BY MOUTH EVERY 7 DAYS 06/02/22   Myrlene Broker, MD    Family History Family History  Problem Relation Age of Onset   Emphysema Maternal Grandmother        smoked   Heart disease Maternal Grandmother 81       MI   Rheum arthritis Mother    Allergies Daughter    Colon cancer Neg Hx     Social  History Social History   Tobacco Use   Smoking status: Never   Smokeless tobacco: Never  Vaping Use   Vaping status: Never Used  Substance Use Topics   Alcohol use: No   Drug use: No     Allergies   Vancomycin, Aspirin, Contrast media [iodinated contrast media], Ciprofloxacin, Cyclobenzaprine, Farxiga [dapagliflozin], and Sulfa antibiotics   Review of Systems Review of Systems  Constitutional:  Negative for chills and fever.  Eyes:  Negative for discharge and redness.  Gastrointestinal:  Negative for nausea and vomiting.  Musculoskeletal:  Negative for joint swelling.  Skin:  Negative for color change.  Neurological:  Negative for numbness.     Physical Exam Triage Vital Signs ED Triage Vitals  Encounter Vitals Group     BP 12/13/22 1341 (!) 143/95     Systolic BP Percentile --      Diastolic BP Percentile --      Pulse Rate 12/13/22 1341 60     Resp 12/13/22 1341 18     Temp 12/13/22 1341 98.9 F (37.2 C)     Temp Source 12/13/22 1341 Oral     SpO2 12/13/22 1341 97 %     Weight --      Height --      Head Circumference --      Peak Flow --      Pain Score 12/13/22  1342 5     Pain Loc --      Pain Education --      Exclude from Growth Chart --    No data found.  Updated Vital Signs BP (!) 143/95 (BP Location: Left Arm)   Pulse 60   Temp 98.9 F (37.2 C) (Oral)   Resp 18   SpO2 97%    Physical Exam Vitals and nursing note reviewed.  Constitutional:      General: She is not in acute distress.    Appearance: Normal appearance. She is not ill-appearing.  HENT:     Head: Normocephalic and atraumatic.  Eyes:     Conjunctiva/sclera: Conjunctivae normal.  Cardiovascular:     Rate and Rhythm: Normal rate.  Pulmonary:     Effort: Pulmonary effort is normal.  Musculoskeletal:     Comments: Mildly decreased range of motion of right ankle due to pain with tenderness to palpation noted to right proximal medial foot at proximal 1st metatarsal area.  Surgical scars noted to right foot. Patient able to bear weight with minimal antalgic gait noted  Neurological:     Mental Status: She is alert.  Psychiatric:        Mood and Affect: Mood normal.        Behavior: Behavior normal.        Thought Content: Thought content normal.      UC Treatments / Results  Labs (all labs ordered are listed, but only abnormal results are displayed) Labs Reviewed - No data to display  EKG   Radiology DG Foot Complete Right  Result Date: 12/13/2022 CLINICAL DATA:  Pain, no injury EXAM: RIGHT FOOT COMPLETE - 3+ VIEW COMPARISON:  None Available. FINDINGS: There is no evidence of fracture or dislocation. Bunion deformity of the right great toe without significant arthrosis. Soft tissues are unremarkable. IMPRESSION: No fracture or dislocation of the right foot. Bunion deformity of the right great toe without significant arthrosis. Electronically Signed   By: Jearld Lesch M.D.   On: 12/13/2022 14:41    Procedures Procedures (including critical care time)  Medications Ordered in UC Medications - No data to display  Initial Impression / Assessment and Plan / UC Course  I have reviewed the triage vital signs and the nursing notes.  Pertinent labs & imaging results that were available during my care of the patient were reviewed by me and considered in my medical decision making (see chart for details).    No fracture noted on x-ray.  Recommended further evaluation by Ortho if no gradual improvement given prior surgery, etc.  Patient expresses understanding.  Final Clinical Impressions(s) / UC Diagnoses   Final diagnoses:  Right foot pain     Discharge Instructions      Recommended rest, ice, elevation. Follow up with ortho if no improvement or with any further concerns.     ED Prescriptions   None    PDMP not reviewed this encounter.   Tomi Bamberger, PA-C 12/13/22 1625

## 2022-12-13 NOTE — Discharge Instructions (Signed)
Recommended rest, ice, elevation. Follow up with ortho if no improvement or with any further concerns.

## 2022-12-13 NOTE — ED Triage Notes (Signed)
Pt c/o right foot pain into ankle x 6 days; denies obvious injury; pt sts some swelling and painful to walk on

## 2022-12-13 NOTE — Progress Notes (Signed)
No ICM remote transmission received for 12/07/2022 and next ICM transmission scheduled for 12/28/2022.

## 2022-12-14 ENCOUNTER — Telehealth: Payer: Self-pay | Admitting: *Deleted

## 2022-12-14 NOTE — Telephone Encounter (Signed)
I attempted to contact patient by telephone but was unsuccessful. According to the patient's chart they are due for follow up with  [facility]. I have left a HIPAA compliant message advising the patient to contact [facility] at [phone]. I will continue to follow up with the patient to make sure this appointment is scheduled.

## 2022-12-17 ENCOUNTER — Encounter: Payer: Self-pay | Admitting: *Deleted

## 2022-12-17 NOTE — Telephone Encounter (Signed)
I attempted to contact patient by telephone but was unsuccessful. According to the patient's chart they are due for follow up with  [facility]. I have left a HIPAA compliant message advising the patient to contact [facility] at [phone]. I will continue to follow up with the patient to make sure this appointment is scheduled.

## 2022-12-25 ENCOUNTER — Encounter: Payer: Self-pay | Admitting: Internal Medicine

## 2022-12-25 ENCOUNTER — Ambulatory Visit: Payer: PPO | Admitting: Internal Medicine

## 2022-12-25 ENCOUNTER — Telehealth: Payer: Self-pay | Admitting: Internal Medicine

## 2022-12-25 ENCOUNTER — Ambulatory Visit (INDEPENDENT_AMBULATORY_CARE_PROVIDER_SITE_OTHER): Payer: PPO

## 2022-12-25 VITALS — BP 140/119 | HR 90 | Temp 98.6°F | Ht 65.0 in | Wt 219.0 lb

## 2022-12-25 DIAGNOSIS — I5042 Chronic combined systolic (congestive) and diastolic (congestive) heart failure: Secondary | ICD-10-CM

## 2022-12-25 DIAGNOSIS — J90 Pleural effusion, not elsewhere classified: Secondary | ICD-10-CM | POA: Diagnosis not present

## 2022-12-25 DIAGNOSIS — Z950 Presence of cardiac pacemaker: Secondary | ICD-10-CM | POA: Diagnosis not present

## 2022-12-25 DIAGNOSIS — I517 Cardiomegaly: Secondary | ICD-10-CM | POA: Diagnosis not present

## 2022-12-25 DIAGNOSIS — J4521 Mild intermittent asthma with (acute) exacerbation: Secondary | ICD-10-CM

## 2022-12-25 DIAGNOSIS — R059 Cough, unspecified: Secondary | ICD-10-CM | POA: Diagnosis not present

## 2022-12-25 MED ORDER — PREDNISONE 20 MG PO TABS: 40.0000 mg | ORAL_TABLET | Freq: Every day | ORAL | 0 refills | Status: DC

## 2022-12-25 MED ORDER — PROMETHAZINE-DM 6.25-15 MG/5ML PO SYRP
5.0000 mL | ORAL_SOLUTION | Freq: Four times a day (QID) | ORAL | 0 refills | Status: DC | PRN
Start: 2022-12-25 — End: 2023-01-21

## 2022-12-25 NOTE — Telephone Encounter (Signed)
Patient returned Micaiah's call about her imaging results. She would like a call back at [phone].

## 2022-12-25 NOTE — Assessment & Plan Note (Signed)
Patient is having 2 months of flaring asthma and using albuterol and nebulizer multiple times per day with wheezing. Not wheezing on exam today. Checking CXR for infection given length of symptoms. Rx prednisone for likely asthma flare. Given heart failure and weight gain CXR will also rule out edema.

## 2022-12-25 NOTE — Progress Notes (Signed)
   Subjective:   Patient ID: [name], female    DOB: [birth year], 47 y.o.   MRN: [id number]  HPI The patient is a 47 YO female coming in for 2 months of cough and SOB and wheezing. She went to urgent care 6/21 and got CXR and antibiotics which showed no pneumonia or fluid. She did not feel better and still coughing and wheezing often. Using nebulizer every 6 hours and albuterol inhaler in between. Limited activity due to SOB. SOB worse with lying flat using 3 large pillows. Taking otc nyquil last few days she did not know this could raise BP. Sometimes vomiting due to coughing so much. Last nebulizer treatment last night. Also having pain in right leg/ankle for months.  Review of Systems  Constitutional:  Positive for fatigue and unexpected weight change.  HENT:  Positive for congestion.   Eyes: Negative.   Respiratory:  Positive for cough, shortness of breath and wheezing. Negative for chest tightness.   Cardiovascular:  Negative for chest pain, palpitations and leg swelling.  Gastrointestinal:  Positive for vomiting. Negative for abdominal distention, abdominal pain, constipation, diarrhea and nausea.  Musculoskeletal: Negative.   Skin: Negative.   Neurological: Negative.   Psychiatric/Behavioral: Negative.      Objective:  Physical Exam Constitutional:      Appearance: She is well-developed.  HENT:     Head: Normocephalic and atraumatic.  Cardiovascular:     Rate and Rhythm: Normal rate and regular rhythm.  Pulmonary:     Effort: Pulmonary effort is normal. No respiratory distress.     Breath sounds: Normal breath sounds. No wheezing or rales.     Comments: Coughs with even medium size breath Abdominal:     General: Bowel sounds are normal. There is no distension.     Palpations: Abdomen is soft.     Tenderness: There is no abdominal tenderness. There is no rebound.  Musculoskeletal:        General: Tenderness present.     Cervical back: Normal range of motion.      Comments: 1+ edema bilateral consistent with prior  Skin:    General: Skin is warm and dry.     Comments: Soft tissue right medial lower leg lump present and tender to touch  Neurological:     Mental Status: She is alert and oriented to person, place, and time.     Coordination: Coordination normal.     Vitals:   12/25/22 0905 12/25/22 0908  BP: (!) 140/119 (!) 140/119  Pulse: 90   Temp: 98.6 F (37 C)   TempSrc: Oral   SpO2: 98%   Weight: 219 lb (99.3 kg)   Height: 5\' 5"  (1.651 m)     Assessment & Plan:  Visit time 20 minutes in face to face communication with patient and coordination of care, additional 10 minutes spent in record review, coordination or care, ordering tests, communicating/referring to other healthcare professionals, documenting in medical records all on the same day of the visit for total time 30 minutes spent on the visit.

## 2022-12-25 NOTE — Patient Instructions (Signed)
We have sent in prednisone to take 2 pills daily for 5 days.  We will check the chest x-ray and may change treatment if something shows up there.  We have sent in promethazine/dm cough syrup to use at night time to help

## 2022-12-25 NOTE — Assessment & Plan Note (Signed)
Weight up 7 pounds since last visit however she is not sure she feels swollen. Legs with 1+ edema consistent with prior. She is sleeping elevated and more SOB with lying flat. Monitoring device noted extra fluid 11/23/22 and she did not change fluid pill dosing then. Checking CXR and if fluid present double dosing 3 days.

## 2022-12-25 NOTE — Telephone Encounter (Signed)
Patient was able to review results via my chart with no question or concerns

## 2022-12-28 ENCOUNTER — Ambulatory Visit: Payer: PPO | Attending: Internal Medicine

## 2022-12-28 DIAGNOSIS — I5042 Chronic combined systolic (congestive) and diastolic (congestive) heart failure: Secondary | ICD-10-CM

## 2022-12-28 DIAGNOSIS — Z95810 Presence of automatic (implantable) cardiac defibrillator: Secondary | ICD-10-CM

## 2022-12-28 MED ORDER — TORSEMIDE 20 MG PO TABS: 40.0000 mg | ORAL_TABLET | Freq: Two times a day (BID) | ORAL | 2 refills | Status: DC

## 2022-12-28 MED ORDER — POTASSIUM CHLORIDE CRYS ER 20 MEQ PO TBCR: 60.0000 meq | EXTENDED_RELEASE_TABLET | Freq: Two times a day (BID) | ORAL | 2 refills | Status: DC

## 2022-12-28 NOTE — Progress Notes (Signed)
Spoke with patient and advised Prince Rome,, NP at HF clinic increased Torsemide to 40 mg twice a day and Potassium 60 mEq twice a day.  Labs needed in a week and scheduled for 8/12 at HF clinic.  Advised to call the office for OV for overdue appt.  She agreed to recommendations and verbalized understanding.  She has enough potassium on hand to change the dosage and will call when refill is needed.

## 2022-12-28 NOTE — Progress Notes (Signed)
EPIC Encounter for ICM Monitoring  Patient Name: [name] is a 47 y.o. female Date: 12/28/2022 Primary Care Physican: Myrlene Broker, MD Primary Cardiologist: Bensimhon Electrophysiologist: Graciela Husbands 11/19/2021 Weight: 214 lbs      04/08/2022 Weight: 190-194 lbs      05/13/2022 Weight: 193 lbs     09/15/2022 Weight: 206 lbs   (baseline closer to 198 lbs)      11/27/2022 Weight: 205 lbs          12/28/2022 Weight: 218 lbs               Spoke with patient and heart failure questions reviewed.  Transmission results reviewed.    FLUID SX:  Pt reports tightness in abdomen and around mid torso area.  Weight is up 8 lbs in the last week but 18 lbs since 7/5 (fluid accumulation started ~7/2).  She's had a cough since June and currently taking prednisone and cough syrup as prescribed by PCP.  Also using nebulizer and inhalers.   I  Tried taking 40 mg Torsemide x 3 days this past weekend but urine output is poor.  No relief of sx after taking extra Torsemide.   Heartlogic HF Index 56 suggesting possible fluid accumulation starting 7/2.   Thoracic impedance trending very low.       Barostim implant 6/27.   Prescribed: Torsemide 20 mg Take 1 tablet  (20 mg total) by mouth daily. Can take extra 20 mg as needed. Potassium 20 mEq take 2 tablet(s) by mouth daily Spironolactone 25 mg take 1 tablet (25 mg total) at bedtime.    Labs: 10/28/2022 Creatinine 0.83, BUN 9,   Potassium 3.1, Sodium 136, GFR >60 10/23/2022 Creatinine 0.98, BUN 5,   Potassium 3.4, Sodium 137, GFR >60 07/30/2022 Creatinine 0.85, BUN 6,   Potassium 3.6, Sodium 136, GFR >60 07/03/2022 Creatinine 0.93, BUN 5,   Potassium 3.6, Sodium 136, GFR 76 06/03/2022 Creatinine 0.87, BUN 8,   Potassium 3.8, Sodium 135, GFR >60, BNP 710.3 05/06/2022 Creatinine 1.30, BUN 8,   Potassium 3.2, Sodium 137, GFR 51,   BNP 251.3 05/04/2022 Creatinine 1.07, BUN 9,   Potassium 3.9, Sodium 139  A complete set of results can be found in Results  Review.   Recommendations:  Copy sent to Prince Rome, NP for review and recommendations.    Follow-up plan: ICM clinic phone appointment on 01/04/2023 to recheck fluid levels.  91 day device clinic remote transmission 10/305/2024.              EP/Cardiology next office visit:  Missed 11/05/2022 with Otilio Saber, PA and not rescheduled.  Pt aware she needs to schedule office visit with Dr Leory Plowman (last visit 07/31/2022).            Copy of ICM check sent to Dr. Graciela Husbands.   3 Month HeartLogicT Heart Failure Index:    8 Day Data Trend:          Karie Soda, RN 12/28/2022 1:34 PM

## 2022-12-28 NOTE — Progress Notes (Signed)
[place], Anderson Malta, FNP  , Josephine Igo, RN Burnett Kanaris, yes agree.  Increase torsemide to 40 mg bid, increase KCL to 60 bid. She needs BMET in 1 week and follow up with [facility], She is past due for follow up here.

## 2022-12-31 ENCOUNTER — Other Ambulatory Visit: Payer: Self-pay | Admitting: Internal Medicine

## 2023-01-04 ENCOUNTER — Ambulatory Visit: Payer: PPO | Attending: Internal Medicine

## 2023-01-04 ENCOUNTER — Other Ambulatory Visit (HOSPITAL_COMMUNITY): Payer: PPO

## 2023-01-04 ENCOUNTER — Encounter (HOSPITAL_COMMUNITY): Payer: Self-pay

## 2023-01-04 DIAGNOSIS — Z95810 Presence of automatic (implantable) cardiac defibrillator: Secondary | ICD-10-CM

## 2023-01-04 DIAGNOSIS — I5042 Chronic combined systolic (congestive) and diastolic (congestive) heart failure: Secondary | ICD-10-CM

## 2023-01-06 ENCOUNTER — Other Ambulatory Visit: Payer: Self-pay | Admitting: Internal Medicine

## 2023-01-06 ENCOUNTER — Encounter: Payer: Self-pay | Admitting: Internal Medicine

## 2023-01-07 ENCOUNTER — Other Ambulatory Visit: Payer: Self-pay | Admitting: Internal Medicine

## 2023-01-07 ENCOUNTER — Encounter: Payer: Self-pay | Admitting: Family Medicine

## 2023-01-07 MED ORDER — ALBUTEROL SULFATE HFA 108 (90 BASE) MCG/ACT IN AERS: 1.0000 | INHALATION_SPRAY | Freq: Four times a day (QID) | RESPIRATORY_TRACT | 0 refills | Status: DC | PRN

## 2023-01-07 MED ORDER — UBRELVY 100 MG PO TABS: 100.0000 mg | ORAL_TABLET | Freq: Every day | ORAL | 3 refills | Status: AC | PRN

## 2023-01-07 MED ORDER — TIZANIDINE HCL 2 MG PO CAPS: 2.0000 mg | ORAL_CAPSULE | Freq: Every evening | ORAL | 0 refills | Status: DC | PRN

## 2023-01-08 NOTE — Progress Notes (Signed)
EPIC Encounter for ICM Monitoring  Patient Name: [name] is a 47 y.o. female Date: 01/08/2023 Primary Care Physican: Myrlene Broker, MD Primary Cardiologist: Bensimhon Electrophysiologist: Graciela Husbands 11/19/2021 Weight: 214 lbs      04/08/2022 Weight: 190-194 lbs      05/13/2022 Weight: 193 lbs     09/15/2022 Weight: 206 lbs   (baseline closer to 198 lbs)      11/27/2022 Weight: 205 lbs          12/28/2022 Weight: 218 lbs               Attempted call to patient and unable to reach.  Left detailed message per DPR regarding transmission. Transmission reviewed.     Heartlogic HF Index decreased from 60 to 20 suggesting fluid levels returning to normal.   Thoracic impedance trending up.       Barostim implant 6/27.   Prescribed: Torsemide 20 mg Take 2 tablets (40 mg total) by mouth twice a day Potassium 20 mEq take 3 tablet(s) (60 mEq total) by mouth twice a day Spironolactone 25 mg take 1 tablet (25 mg total) at bedtime.    Labs: 01/04/2023 BMET SCHEDULED but no labs drawn  10/28/2022 Creatinine 0.83, BUN 9,   Potassium 3.1, Sodium 136, GFR >60 10/23/2022 Creatinine 0.98, BUN 5,   Potassium 3.4, Sodium 137, GFR >60 07/30/2022 Creatinine 0.85, BUN 6,   Potassium 3.6, Sodium 136, GFR >60 07/03/2022 Creatinine 0.93, BUN 5,   Potassium 3.6, Sodium 136, GFR 76 06/03/2022 Creatinine 0.87, BUN 8,   Potassium 3.8, Sodium 135, GFR >60, BNP 710.3 05/06/2022 Creatinine 1.30, BUN 8,   Potassium 3.2, Sodium 137, GFR 51,   BNP 251.3 05/04/2022 Creatinine 1.07, BUN 9,   Potassium 3.9, Sodium 139  A complete set of results can be found in Results Review.   Recommendations:  Left voice mail with ICM number and encouraged to call if experiencing any fluid symptoms.   Follow-up plan: ICM clinic phone appointment on 01/18/2023 to recheck fluid levels.  91 day device clinic remote transmission 03/24/2023.              EP/Cardiology next office visit:  Missed 11/05/2022 with Otilio Saber, PA and not  rescheduled.  01/21/2023 with HF clinic.            Copy of ICM check sent to Dr. Graciela Husbands.   Will send to HF clinic if patient is reached.   3 Month HeartLogicT Heart Failure Index:    8 Day Data Trend:          Karie Soda, RN 01/08/2023 3:24 PM

## 2023-01-20 ENCOUNTER — Telehealth (HOSPITAL_COMMUNITY): Payer: Self-pay

## 2023-01-20 NOTE — Telephone Encounter (Signed)
Called and left patient a voice message to confirm/remind patient of their appointment at the [facility] on 01/21/23.   And to bring in all medications and/or complete list.

## 2023-01-21 ENCOUNTER — Ambulatory Visit (HOSPITAL_COMMUNITY)
Admission: RE | Admit: 2023-01-21 | Discharge: 2023-01-21 | Disposition: A | Discharge status: Home / Self Care | Payer: PPO | Source: Ambulatory Visit | Attending: Cardiology | Admitting: Cardiology

## 2023-01-21 ENCOUNTER — Encounter (HOSPITAL_COMMUNITY): Payer: Self-pay

## 2023-01-21 VITALS — BP 120/82 | HR 97 | Wt 214.6 lb

## 2023-01-21 DIAGNOSIS — G4733 Obstructive sleep apnea (adult) (pediatric): Secondary | ICD-10-CM | POA: Diagnosis not present

## 2023-01-21 DIAGNOSIS — I493 Ventricular premature depolarization: Secondary | ICD-10-CM | POA: Insufficient documentation

## 2023-01-21 DIAGNOSIS — I5042 Chronic combined systolic (congestive) and diastolic (congestive) heart failure: Secondary | ICD-10-CM | POA: Diagnosis not present

## 2023-01-21 DIAGNOSIS — Z86718 Personal history of other venous thrombosis and embolism: Secondary | ICD-10-CM | POA: Insufficient documentation

## 2023-01-21 DIAGNOSIS — R0789 Other chest pain: Secondary | ICD-10-CM | POA: Diagnosis not present

## 2023-01-21 DIAGNOSIS — I110 Hypertensive heart disease with heart failure: Secondary | ICD-10-CM | POA: Diagnosis not present

## 2023-01-21 DIAGNOSIS — R000 Tachycardia, unspecified: Secondary | ICD-10-CM | POA: Insufficient documentation

## 2023-01-21 DIAGNOSIS — E119 Type 2 diabetes mellitus without complications: Secondary | ICD-10-CM | POA: Diagnosis not present

## 2023-01-21 DIAGNOSIS — R008 Other abnormalities of heart beat: Secondary | ICD-10-CM | POA: Diagnosis not present

## 2023-01-21 DIAGNOSIS — Z7984 Long term (current) use of oral hypoglycemic drugs: Secondary | ICD-10-CM | POA: Insufficient documentation

## 2023-01-21 DIAGNOSIS — Z9884 Bariatric surgery status: Secondary | ICD-10-CM | POA: Insufficient documentation

## 2023-01-21 DIAGNOSIS — F419 Anxiety disorder, unspecified: Secondary | ICD-10-CM | POA: Insufficient documentation

## 2023-01-21 DIAGNOSIS — Z6835 Body mass index (BMI) 35.0-35.9, adult: Secondary | ICD-10-CM | POA: Diagnosis not present

## 2023-01-21 DIAGNOSIS — I5022 Chronic systolic (congestive) heart failure: Secondary | ICD-10-CM

## 2023-01-21 DIAGNOSIS — F32A Depression, unspecified: Secondary | ICD-10-CM | POA: Diagnosis not present

## 2023-01-21 DIAGNOSIS — Z79899 Other long term (current) drug therapy: Secondary | ICD-10-CM | POA: Diagnosis not present

## 2023-01-21 LAB — COMPREHENSIVE METABOLIC PANEL
ALT: 30 U/L (ref 0–44)
AST: 38 U/L (ref 15–41)
Albumin: 3.8 g/dL (ref 3.5–5.0)
Alkaline Phosphatase: 64 U/L (ref 38–126)
Anion gap: 12 (ref 5–15)
BUN: 7 mg/dL (ref 6–20)
CO2: 23 mmol/L (ref 22–32)
Calcium: 9.4 mg/dL (ref 8.9–10.3)
Chloride: 106 mmol/L (ref 98–111)
Creatinine, Ser: 0.9 mg/dL (ref 0.44–1.00)
GFR, Estimated: >60 mL/min (ref >60)
Glucose, Bld: 84 mg/dL (ref 70–99)
Potassium: 4.2 mmol/L (ref 3.5–5.1)
Sodium: 141 mmol/L (ref 135–145)
Total Bilirubin: 1.1 mg/dL (ref 0.3–1.2)
Total Protein: 6.7 g/dL (ref 6.5–8.1)

## 2023-01-21 LAB — BRAIN NATRIURETIC PEPTIDE: B Natriuretic Peptide: 1167.9 pg/mL — ABNORMAL HIGH (ref 0.0–100.0)

## 2023-01-21 LAB — T4, FREE: Free T4: 1.24 ng/dL — ABNORMAL HIGH (ref 0.61–1.12)

## 2023-01-21 LAB — TSH: TSH: 1.187 u[IU]/mL (ref 0.350–4.500)

## 2023-01-21 MED ORDER — METOLAZONE 2.5 MG PO TABS: 2.5000 mg | ORAL_TABLET | ORAL | 0 refills | Status: DC

## 2023-01-21 NOTE — Progress Notes (Signed)
[facility] Note   PCP: Myrlene Broker, MD Primary Cardiologist: Lesleigh Noe, MD (Inactive)  HF Cardiologist: Dr. 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   Admitted 7/19 with CP Symptoms thought to be related to frequent PVCs. EP consulted and scheduled her for PVC ablation. Echo repeated 11/22/17 and showed improved EF 50-55%.   S/p  PVC ablation 12/01/17. Was felt not to be successful.  Was on flecainide but discontinued due to reduced EF. Placed on amio   Echo 6/20 showed drop in EF back down to 25-30%. Was instructed to f/u in clinic but pt did not. She had outpatient monitor 04/2018 that showed rare PVCs.    Admitted 6/21 with chest pain and shortness of breath. Viral panel negative. ECHO EF < 20% and normal RV. Had cath with normal cors and preserved cardiac output. Plan was to f/u with EP regarding PVCs. She has remained on AAD therapy w/ amiodarone + ? blocker therapy.  Admitted to [facility] 4/22 w/ increased dyspnea and volume overload. Also w/ increased PVC burden. Echo showed EF 20-25%, RV normal. She was diuresed w/ IV Lasix and placed on amiodarone gtt for PVC suppression. Seen by Dr. Ladona Ridgel, due to the location of her PVCs, she is not a candidate for re-do ablation.   S/p Barostim 6/22.  Zio 8/22: 13% PVCs (unifocal)  Seen by EP 12/22 and Barostim titrated. Not felt to have any other options for PVCs.   Echo 06/24/21 EF 25-30% RV normal   Admitted 12/13/21 with COVID.  Treated with 5-day course of Lagevrio and discharged home.   Seen in [facility] ED 04/28/22 with volume overload.  Given total of 120 mg IV lasix and discharged with instructions to take 5 mg metolazone X 2 days.  Follow up 12/23, volume OK on exam but HL score elevated. Zio 14 day placed to quantify PVC burden on mexiletine.   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  -> mexilitene increased to 300 bid  Cardiac PET 6/24 no ischemia/infarction. No active myocardial inflammation/ evidence of sarcoidosis.  Today she returns for HF follow up. Complains of mild chest tightness and slight increase in dyspnea since yesterday. EKG today shows mild sinus tach 101 bpm w/ barosmin artifact.   Device interrogation suggest volume overload. HL score elevated at 32. No VT. Wt also up 9 lb since June, from 205>>214 lb today. She admits that sometimes, she will skip her AM dose of torsemide if busy at work or having to run errands. Did not take morning dose today.   ICD interrogation: HL score 32. 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 LV perfusion is normal. There is no evidence of ischemia. There is no evidence of infarction.   Coronary calcium was absent on the attenuation correction CT images.   FDG uptake findings are inconsistent with active myocardial inflammation/sarcoidosis.   No gated images or EF  due to interference from AICD leads  - Echo (7/23): EF 25-30%  - Echo (1/23): EF 25-30% RV normal   - Echo (4/22): EF 20-25%, RV normal   - PFTs w/ DLOC (4/22) Mild Restriction Normal Diffusion  - CPX (7/21) FVC 2.32 (70%)      FEV1 1.99 (74%)        FEV1/FVC 86 (105%)        MVV 110 (105%) Resting HR: 94 Peak HR: 175   (100% age predicted max HR) BP rest: 126/82 BP peak: 202/78 Peak VO2: 17.6 (86% predicted peak VO2) - corrects to 28.4 for ibw VE/VCO2 slope:  37 OUES: 1.74 Peak RER: 1.08 Ventilatory Threshold: 14.1 (69%  predicted or measured peak VO2) VE/MVV:  53% PETCO2 at peak:   O2pulse:  10   (83% predicted O2pulse)  - RHC (6/21):   RA = 4 RV = 32/6 PA = 31/8 (21) PCW = 12 Fick cardiac output/index = 7.0/3.4 Thermo CO/CI = 5.2/2.5 PVR =1.7 (Thermo) Ao sat = 98% PA sat = 67%, 69%  - Echo (6/21): EF < 20% RV normal   - Echo (6/20): EF 25-30%  - Echo (7/19): EF 50-55%  - CPX (12/18): pVO2 14.0 (corrects to 23.0 for IBW) VeVCO2  32 RER 1.0  - Echo (8/18): EF 25%   - CPX (2/18): FVC 2.57 (79%)      FEV1 2.14 (81%)        FEV1/FVC 83 (101%)        MVV 107 (102%) Resting HR: 106 Peak HR: 166   (93% age predicted max HR) BP rest: 136/98 BP peak: 174/88 Peak VO2: 17.1 (85% predicted peak VO2) - corrects to 28.4 for ibw VE/VCO2 slope:  33 OUES: 2.16 Peak RER: 1.09 Ventilatory Threshold: 14.6 (73% predicted or measured peak VO2) VE/MVV:  59% PETCO2 at peak:  33 O2pulse:  10   (83% predicted O2pulse)  - Echo (12/17) 20-25%  - Cath (6/17): revealed normal vessels, moderate elevation in pulmonary artery pressures, and LVEF 25-30%.  - Echo (6/17): 25-30%  - Echo (6/15): EF 35-40%   Review of systems complete and found to be negative unless listed in HPI.    Past Medical History:  Diagnosis Date   Acute on chronic systolic (congestive) heart failure (HCC) 04/29/2017   Acute pain of right shoulder 06/16/2017   AICD (automatic cardioverter/defibrillator) present    Anginal pain (HCC)    Anxiety    Arthritis    right shoulder    Asthma    CHF (congestive heart failure) (HCC)    Chronic combined systolic and diastolic heart failure (HCC) 03/05/2014   Closed low lateral malleolus fracture 10/23/2013   Cystitis 10/21/2017   Depression    Depression with anxiety 01/20/2013   Diabetes mellitus without complication (HCC)    Diverticulosis    Dyspnea    comes and goes intermittently mostly with exertion    Dysrhythmia    Essential hypertension    Prev followed by [facility]/  Cardiology    Fibroid    age 89   Gallstones    Generalized abdominal cramping    History of cardiomyopathy    Hypertension    IBS (irritable bowel syndrome)    ICD (implantable cardioverter-defibrillator), single, 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 CPAP intolerance >> "smothering" Good tolerance of auto device (optimal pressure 12-13 on download).  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     albuterol (VENTOLIN HFA) 108 (90 Base) MCG/ACT inhaler Inhale 1-2 puffs into the lungs every 6 (six) hours as needed for wheezing or shortness of breath. 18 g 0   amiodarone (PACERONE) 200 MG tablet Take 1 tablet (200 mg total) by mouth daily. 90 tablet 3   budesonide-formoterol (SYMBICORT) 160-4.5 MCG/ACT inhaler Inhale 2 puffs into the lungs 2 (two) times daily as needed. (Patient taking differently: Inhale 2 puffs into the lungs 2 (two) times daily as needed (Asthma).) 10 g 3   CORLANOR 7.5 MG TABS tablet TAKE 1 TABLET BY MOUTH TWICE DAILY WITH A  MEAL 60 tablet 11   dicyclomine (BENTYL) 20 MG tablet Take 1 tablet (20 mg total) by mouth daily as needed for spasms. 90 tablet 3   empagliflozin (JARDIANCE) 25 MG TABS tablet Take 1 tablet (25 mg total) by mouth daily before breakfast. 90 tablet 3   fluticasone (FLONASE) 50 MCG/ACT nasal spray Place 2 sprays into both nostrils daily as needed for allergies. 16 g 3   gabapentin (NEURONTIN) 300 MG capsule Take 1 capsule (300 mg total) by mouth at bedtime as needed (back spasms). 90 capsule 3   ibuprofen (ADVIL) 600 MG tablet Take 1 tablet (600 mg total) by mouth every 6 (six) hours. Take 1 tablet every 6 hours with food regardless of pain for 7 days 30 tablet 0   ipratropium-albuterol (DUONEB) 0.5-2.5 (3) MG/3ML SOLN Take 3 mLs by nebulization every 6 (six) hours as needed. 360 mL 1   lidocaine (LIDODERM) 5 % Place 1 patch onto the skin daily. Remove & Discard patch within 12 hours or as directed by MD (Patient taking differently: Place 1 patch onto the skin daily as needed (Pain).) 90 patch 3   Magnesium Oxide 200 MG TABS Take 1 tablet (200 mg total) by mouth daily. 90 tablet 3   metolazone (ZAROXOLYN) 2.5 MG tablet Take 1 tablet (2.5 mg total) by mouth as directed. 5 tablet 0   metoprolol succinate (TOPROL-XL) 100 MG 24 hr tablet Take 1 tablet (100 mg total) by mouth daily. Take with or immediately following a meal. 90 tablet 3   mexiletine (MEXITIL) 150 MG capsule Take 2 capsules (300 mg total) by mouth 2 (two) times daily. 120 capsule 11   mirtazapine (REMERON) 15 MG tablet Take 1 tablet (15 mg total) by mouth at bedtime. 30 tablet 3   Multiple Vitamins-Minerals (MULTIVITAMIN GUMMIES WOMENS PO) Take 2 tablets by mouth daily.     naproxen sodium (ALEVE) 220 MG tablet  Take 220-440 mg by mouth 2 (two) times daily as needed (Pain).     nitroGLYCERIN (NITROSTAT) 0.4 MG SL tablet Place 1 tablet (0.4 mg total) under the tongue every 5 (five) minutes as needed for chest pain. 75 tablet 0   potassium  chloride SA (KLOR-CON M) 20 MEQ tablet Take 3 tablets (60 mEq total) by mouth 2 (two) times daily. (Patient taking differently: Take 40 mEq by mouth 2 (two) times daily.) 540 tablet 2   sacubitril-valsartan (ENTRESTO) 97-103 MG Take 1 tablet by mouth twice daily 180 tablet 3   spironolactone (ALDACTONE) 25 MG tablet Take 1 tablet (25 mg total) by mouth at bedtime. 90 tablet 3   tizanidine (ZANAFLEX) 2 MG capsule Take 1 capsule (2 mg total) by mouth at bedtime as needed for muscle spasms. 60 capsule 0   torsemide (DEMADEX) 20 MG tablet Take 2 tablets (40 mg total) by mouth 2 (two) times daily. (Patient taking differently: Patient takes 1 tablet by mouth twice a day.) 360 tablet 2   Ubrogepant (UBRELVY) 100 MG TABS Take 1 tablet (100 mg total) by mouth daily as needed (migraine). 30 tablet 3   Vitamin D, Ergocalciferol, (DRISDOL) 1.25 MG (50000 UNIT) CAPS capsule TAKE 1 CAPSULE BY MOUTH EVERY 7 DAYS 12 capsule 0   No current facility-administered medications for this encounter.   Allergies  Allergen Reactions   Vancomycin Other (See Comments)    "did something to my kidneys," PROGRESSED TO KIDNEY FAILURE!!   Aspirin Other (See Comments)    Wheezing, (Pt states that she just wheezes some when she takes aspirin by itself but she can take aspirin in a combination product).   Contrast Media [Iodinated Contrast Media] Other (See Comments)    Multiple CT contrast studies done over 2 weeks caused ARF   Ciprofloxacin Itching and Rash   Cyclobenzaprine Other (See Comments)    Can not tolerate   Farxiga [Dapagliflozin] Rash   Sulfa Antibiotics Itching and Rash   Social History   Socioeconomic History   Marital status: Married    Spouse name: Not on file   Number of children: 1   Years of education: Not on file   Highest education level: Associate degree: occupational, Scientist, product/process development, or vocational program  Occupational History   Occupation: stay at home mom  Tobacco Use   Smoking status: Never    Smokeless tobacco: Never  Vaping Use   Vaping status: Never Used  Substance and Sexual Activity   Alcohol use: No   Drug use: No   Sexual activity: Yes    Birth control/protection: Surgical, Pill    Comment: BTL  Other Topics Concern   Not on file  Social History Narrative   Not on file   Social Determinants of Health   Financial Resource Strain: Low Risk  (08/18/2022)   Overall Financial Resource Strain (CARDIA)    Difficulty of Paying Living Expenses: Not hard at all  Food Insecurity: No Food Insecurity (08/18/2022)   Hunger Vital Sign    Worried About Running Out of Food in the Last Year: Never true    Ran Out of Food in the Last Year: Never true  Transportation Needs: No Transportation Needs (08/18/2022)   PRAPARE - Administrator, Civil Service (Medical): No    Lack of Transportation (Non-Medical): No  Physical Activity: Insufficiently Active (08/18/2022)   Exercise Vital Sign    Days of Exercise per Week: 1 day    Minutes of Exercise per Session:  30 min  Stress: Stress Concern Present (08/18/2022)   [employer] of Occupational Health - Occupational Stress Questionnaire    Feeling of Stress : Very much  Social Connections: Moderately Isolated (08/18/2022)   Social Connection and Isolation Panel [NHANES]    Frequency of Communication with Friends and Family: Once a week    Frequency of Social Gatherings with Friends and Family: Never    Attends Religious Services: Never    Database administrator or Organizations: Yes    Attends Engineer, structural: More than 4 times per year    Marital Status: Married  Catering manager Violence: Not on file   Family History  Problem Relation Age of Onset   Emphysema Maternal Grandmother        smoked   Heart disease Maternal Grandmother 28       MI   Rheum arthritis Mother    Allergies Daughter    Colon cancer Neg Hx    BP 120/82   Pulse 97   Wt 97.3 kg (214 lb 9.6 oz)   SpO2 97%   BMI 35.71 kg/m    Wt Readings from Last 3 Encounters:  01/21/23 97.3 kg (214 lb 9.6 oz)  12/25/22 99.3 kg (219 lb)  10/28/22 93 kg (205 lb 0.4 oz)   PHYSICAL EXAM: General:  Well appearing. No respiratory difficulty HEENT: normal Neck: supple. JVD 8 cm. Carotids 2+ bilat; no bruits. No lymphadenopathy or thyromegaly appreciated. Cor: PMI nondisplaced. Regular rate & rhythm. No rubs, gallops or murmurs. Lungs: clear Abdomen: soft, nontender, +distended. No hepatosplenomegaly. No bruits or masses. Good bowel sounds. Extremities: no cyanosis, clubbing, rash, edema Neuro: alert & oriented x 3, cranial nerves grossly intact. moves all 4 extremities w/o difficulty. Affect pleasant.    Device interrogation: HL score 32. No VT   ECG : artifact from barostim, sinus tach 101 bpm   ASSESSMENT & PLAN: 1. Chronic systolic HF:   - due to peri-partum CM, onset 1999.  - S/P [employer] ICD 07/2016 - LHC 6/17 No CAD - Echo 12/17 EF 20-25% - Echo 8/18 EF 25-30% s/p ICD 3/18.  - RHC 3/19: RA 2, PA 28/13, Fick CO 5.7, CI 2.8 - CPX 12/18 showed moderate limitation due mostly to obesity with some HF component - Echo 7/19 EF 50-55%, Grade 1 DD, Mild MR, Mod LAE - Echo 12/19 EF 20-25%. RV ok  - Echo 6/21 EF < 20%. - Echo 4/22 EF 20-25%, RV normal  - Has Barostim 6/22   - Echo 1/23 25-30%  - Echo 7/23 EF 25-30% - Recent Zio results worrisome for LMNA CM. 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 Suspect progressive course. Has been referred to Dr. Joseph>>Variant is interpreted as variant of unknown clinical significance. Per Dr. Conan Bowens, as there are no affected family members to determine if this variant segregates with disease, genetic testing her daughter for this familial TTN variant is unwarranted  - Cardiac PET 6/24: no ischemia/infarction. No active myocardial inflammation/ evidence of sarcoidosis - NYHA III. Volume overloaded on exam and by device interrogation. HL Score  32. Wt up 9 lb from dry wt.  - Continue torsemide 20 mg bid. Take dose of metolazone 2.5 mg x 1 today. Repeat on Saturday if wt still above 205 lb. Will repeat remote device interrogation in 1 wk to reassess impedence.  - Continue Jardiance 25 mg daily. - Continue Entesto 97-103 mg bid. - Continue spiro 25 mg qhs. -  Continue Toprol to 100 daily  - Continue Corlanor 7.5 mg bid. - Unclear if PVCs are a result of her CM or contributing to it (or both). Failed previous PVC ablation. C/w high burden despite 2 AAD therapy regimen w/ amiodarone and mexiletine  - Suspect may need advanced therapies down the road. Proceed with CPX testing in the near future, once better diuresed.   2. Frequent PVCs/ Bigeminy - Holter Monitor 6/19 with 30% PVCs with one primary morphology.  - S/p PVC ablation 11/2017 with Dr Ladona Ridgel unsuccessful - Off flecainide with EF down and persistent PVCs.  - Zio Patch 04/2019 > 17% PVCs and >8% couplets.  - Zio Patch 11/28/2019 8.5 % PVCs - Zio 9/22 13% PVCs (unifocal)  - Per Dr. Ladona Ridgel, due to the location of her PVCs, she is not a candidate for re-do ablation. Could consider referral to tertiary center - Zio 14 day (12/23) showed frequent NSVT, 13% PVC burden (see discussion above) - Cardiac PET 6/24 negative for sarcoid  - Continue amiodarone 200 mg daily. Check TFTs and HFTs today. Needs yearly eye exams  - Continue mexiletine 300 mg bid  - Not sure many options left. Will d/w Dr. Ladona Ridgel   3. Morbid obesity - s/p gastric Sleeve at [facility] 09/20/17  - Body mass index is 35.71 kg/m.   4. Daytime Fatigue - Sleep study in 2019 and again in 2021 were both negative for sleep apnea   5. DM 2 - On Jardiance - Per PCP  Repeat remove device interrogation in 1 wk to reassess impedence. F/u w/ APP in 3-4 wks.   Robbie Lis, PA-C  4:18 PM

## 2023-01-21 NOTE — Patient Instructions (Signed)
EKG done today.  Labs done today. We will contact you only if your labs are abnormal.  START Metolazone 2.5mg  (1 tablet) by mouth as needed if weight is above 205. TODAY ONLY TAKE 1 TABLET WITH AN EXTRA (2 TABLETS) OF POTASSIUM. REPEAT ON SATURDAY IF NEEDED IF WEIGHT IS ABOVE 205.   No other medication changes were made. Please continue all current medications as prescribed.  Your physician recommends that you schedule a follow-up appointment in: 4 weeks with our NP/PA Clinic here in our office.   If you have any questions or concerns before your next appointment please send [place] a message through [place] or call our office at [phone].    TO LEAVE A MESSAGE FOR THE NURSE SELECT OPTION 2, PLEASE LEAVE A MESSAGE INCLUDING: YOUR NAME DATE OF BIRTH CALL BACK NUMBER REASON FOR CALL**this is important as we prioritize the call backs  YOU WILL RECEIVE A CALL BACK THE SAME DAY AS LONG AS YOU CALL BEFORE 4:00 PM   Do the following things EVERYDAY: Weigh yourself in the morning before breakfast. Write it down and keep it in a log. Take your medicines as prescribed Eat low salt foods--Limit salt (sodium) to 2000 mg per day.  Stay as active as you can everyday Limit all fluids for the day to less than 2 liters   At the [facility], you and your health needs are our priority. As part of our continuing mission to provide you with exceptional heart care, we have created designated Provider Care Teams. These Care Teams include your primary Cardiologist (physician) and Advanced Practice Providers (APPs- Physician Assistants and Nurse Practitioners) who all work together to provide you with the care you need, when you need it.   You may see any of the following providers on your designated Care Team at your next follow up: Dr Arvilla Meres Dr Marca Ancona Dr. Marcos Eke, NP Robbie Lis, [place] [facility] [place], [place] Brynda Peon,  NP Karle Plumber, PharmD   Please be sure to bring in all your medications bottles to every appointment.    Thank you for choosing [facility]

## 2023-01-22 LAB — T3, FREE: T3, Free: 3.8 pg/mL (ref 2.0–4.4)

## 2023-01-26 ENCOUNTER — Telehealth: Payer: Self-pay

## 2023-01-26 ENCOUNTER — Ambulatory Visit: Payer: PPO | Attending: Internal Medicine

## 2023-01-26 DIAGNOSIS — Z95810 Presence of automatic (implantable) cardiac defibrillator: Secondary | ICD-10-CM

## 2023-01-26 DIAGNOSIS — I5042 Chronic combined systolic (congestive) and diastolic (congestive) heart failure: Secondary | ICD-10-CM

## 2023-01-26 NOTE — Progress Notes (Signed)
EPIC Encounter for ICM Monitoring  Patient Name: [name] is a 47 y.o. female Date: 01/26/2023 Primary Care Physican: Myrlene Broker, MD Primary Cardiologist: Bensimhon Electrophysiologist: Graciela Husbands 11[birth year]19/2023 Weight: 214 lbs      04/08/2022 Weight: 190-194 lbs      05/13/2022 Weight: 193 lbs     09/15/2022 Weight: 206 lbs   (baseline closer to 198 lbs)      11/27/2022 Weight: 205 lbs          12/28/2022 Weight: 218 lbs       01/27/2023 Weight: 200 lbs         Spoke with patient and heart failure questions reviewed.  Transmission results reviewed.  Pt asymptomatic for fluid accumulation.  She reports a 14 lb weight loss and stomach fluid resolved after taking 1 Metolazone dosage.   Heartlogic HF Index decreased from 32 (at time of 8/29 HF clinic appt) to 0 suggesting fluid levels returned to normal after taking Metolazone 2.5 mg x 1 dose.          Prescribed: Torsemide 20 mg Take 2 tablets (40 mg total) by mouth twice a day Potassium 20 mEq take 3 tablet(s) (60 mEq total) by mouth twice a day Spironolactone 25 mg take 1 tablet (25 mg total) at bedtime.    Labs: 01/21/2023 Creatinine 0.90, BUN 7,   Potassium 4.2, Sodium 141, BNP 1,167.9  10/28/2022 Creatinine 0.83, BUN 9,   Potassium 3.1, Sodium 136, GFR >60 10/23/2022 Creatinine 0.98, BUN 5,   Potassium 3.4, Sodium 137, GFR >60 07/30/2022 Creatinine 0.85, BUN 6,   Potassium 3.6, Sodium 136, GFR >60 07/03/2022 Creatinine 0.93, BUN 5,   Potassium 3.6, Sodium 136, GFR 76 06/03/2022 Creatinine 0.87, BUN 8,   Potassium 3.8, Sodium 135, GFR >60, BNP 710.3 05/06/2022 Creatinine 1.30, BUN 8,   Potassium 3.2, Sodium 137, GFR 51,   BNP 251.3 05/04/2022 Creatinine 1.07, BUN 9,   Potassium 3.9, Sodium 139  A complete set of results can be found in Results Review.   Recommendations:  No changes and encouraged to call if experiencing any fluid symptoms.   Follow-up plan: ICM clinic phone appointment on 02/01/2023.  91 day device clinic remote  transmission 03/24/2023.              EP/Cardiology next office visit:  Recall 05/27/2023 with Otilio Saber, PA.  02/19/2023 with HF clinic.            Copy of ICM check sent to Dr. Graciela Husbands.   3 Month HeartLogicT Heart Failure Index:    8 Day Data Trend:          Karie Soda, RN 01/26/2023 10:53 AM

## 2023-01-26 NOTE — Telephone Encounter (Signed)
Remote ICM transmission received.  Attempted call to patient regarding ICM remote transmission and left detailed message per DPR with ICM phone number to return call.    

## 2023-01-29 MED ORDER — METOLAZONE 2.5 MG PO TABS: ORAL_TABLET | ORAL | Status: DC

## 2023-01-29 NOTE — Progress Notes (Signed)
Theresia Bough, CMA  Sheleen Conchas, Josephine Igo, RN      Previous Messages    ----- Message ----- From: Allayne Butcher, PA-C Sent: 01/29/2023  11:20 AM EDT To: [facility] Triage Pool  Device interrogation shows fluid levels have returned to normal. Continue current diuretic regimen. Can take metolazone PRN if > 5 lb wt gain in 1 wk.

## 2023-01-29 NOTE — Progress Notes (Addendum)
Attempted call to patient to provide recommendations by Robbie Lis, PA at HF clinic and unable to reach.  Left message to return call with direct ICM number.  Metolazone EPIC script updated.

## 2023-01-29 NOTE — Progress Notes (Signed)
Attempted call to patient and unable to reach.    

## 2023-01-29 NOTE — Addendum Note (Signed)
Addended by: Karie Soda on: 01/29/2023 03:07 PM   Modules accepted: Orders

## 2023-02-01 ENCOUNTER — Other Ambulatory Visit: Payer: Self-pay | Admitting: Family Medicine

## 2023-02-01 ENCOUNTER — Ambulatory Visit: Payer: PPO | Attending: Internal Medicine

## 2023-02-01 DIAGNOSIS — I5042 Chronic combined systolic (congestive) and diastolic (congestive) heart failure: Secondary | ICD-10-CM

## 2023-02-01 DIAGNOSIS — Z95810 Presence of automatic (implantable) cardiac defibrillator: Secondary | ICD-10-CM

## 2023-02-01 NOTE — Progress Notes (Signed)
Attempted call to patient and unable to reach.  Left message to return call regarding HF clinic recommendations and requested to send manual remote transmission 9/9 to recheck fluid levels.

## 2023-02-02 NOTE — Progress Notes (Unsigned)
[name] [address] [place] [address] Phone: [phone] Subjective:    I'm seeing this patient by the request  of:  Myrlene Broker, MD  CC: Neck pain and right-sided back pain  [id number]  [name] is a 47 y.o. female coming in with complaint of back and neck pain,. OMT 03/16/2022. Patient states she has been feeling awful. C/o compression, esp on the L-side of her neck. Pt also notes pain along the R SI joint. She feels like she made some improvement after her prior visit, but slipped in the shower and thinks that may have thrown her out. Pain is causing HA.   Radiates: yes- slightly into L shoulder/proximal upper arm.  Numbness/tingling: no  Medications patient has been prescribed:   Taking:         Reviewed prior external information including notes and imaging from previsou exam, outside providers and external EMR if available.   As well as notes that were available from care everywhere and other healthcare systems.  Past medical history, social, surgical and family history all reviewed in electronic medical record.  No pertanent information unless stated regarding to the chief complaint.   Past Medical History:  Diagnosis Date   Acute on chronic systolic (congestive) heart failure (HCC) 04/29/2017   Acute pain of right shoulder 06/16/2017   AICD (automatic cardioverter/defibrillator) present    Anginal pain (HCC)    Anxiety    Arthritis    right shoulder    Asthma    CHF (congestive heart failure) (HCC)    Chronic combined systolic and diastolic heart failure (HCC) 03/05/2014   Closed low lateral malleolus fracture 10/23/2013   Cystitis 10/21/2017   Depression    Depression with anxiety 01/20/2013   Diabetes mellitus without complication (HCC)    Diverticulosis    Dyspnea    comes and goes intermittently mostly with exertion    Dysrhythmia    Essential hypertension    Prev followed by [facility]/  Cardiology    Fibroid    age 89   Gallstones    Generalized abdominal cramping    History of cardiomyopathy    Hypertension    IBS (irritable bowel syndrome)    ICD (implantable cardioverter-defibrillator), single, 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 CPAP intolerance >> "smothering" Good tolerance of auto device (optimal pressure 12-13 on download).  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   Aspirin Other (See Comments)    Wheezing, (Pt states  that she just wheezes some when she takes aspirin by itself but she can take aspirin in a combination product).   Contrast Media [Iodinated Contrast Media] Other (See Comments)    Multiple CT contrast studies done over 2 weeks caused ARF   Ciprofloxacin Itching and Rash   Cyclobenzaprine Other (See Comments)    Can not tolerate   [place] [Dapagliflozin] Rash   Sulfa Antibiotics Itching and Rash     Review of Systems:  No , visual changes, nausea, vomiting, diarrhea, constipation, dizziness, abdominal pain, skin rash, fevers, chills, night  sweats, weight loss, swollen lymph nodes, body aches, joint swelling, chest pain, shortness of breath, mood changes. POSITIVE muscle aches, headache  Objective  Blood pressure 127/84, pulse 91, height 5\' 5"  (1.651 m), weight 208 lb (94.3 kg), SpO2 96%.   General: No apparent distress alert and oriented x3 mood and affect normal, dressed appropriately.  HEENT: Pupils equal, extraocular movements intact  Respiratory: [name] speak in full sentences and does not appear short of breath  Cardiovascular: No lower extremity edema, non tender, no erythema  Low back does have some loss of lordosis noted.  Some tenderness to palpation in the paraspinal musculature.  Osteopathic findings  C2 flexed rotated and side bent right C5 flexed rotated and side bent left T3 extended rotated and side bent right inhaled rib T5 extended rotated and side bent left L2 flexed rotated and side bent right L5 flexed rotated and side bent left Sacrum right on right       Assessment and Plan:  Chronic right-sided low back pain without sciatica Seems to be more of an aggravation of the sacroiliac joint again.  Discussed with patient about continuing the core strengthening.  Patient has done extremely well with weight loss.  We discussed different things such as social determinant of health and patient does have significant stress as well as a high risk of depression per patient's primary care.  Did not want to make any massive changes in medications but given a muscle relaxer for breakthrough pain.  Discussed icing regimen and home exercises.  Increase activity slowly.  Follow-up again in 6 to 8 weeks     Nonallopathic problems  Decision today to treat with OMT was based on Physical Exam  After verbal consent patient was treated with HVLA, ME, FPR techniques in cervical, rib, thoracic, lumbar, and sacral  areas  Patient tolerated the procedure well with improvement in symptoms  Patient given exercises,  stretches and lifestyle modifications  See medications in patient instructions if given  Patient will follow up in 4-8 weeks    The above documentation has been reviewed and is accurate and complete Judi Saa, DO          Note: This dictation was prepared with Dragon dictation along with smaller phrase technology. Any transcriptional errors that result from this process are unintentional.

## 2023-02-03 ENCOUNTER — Ambulatory Visit: Payer: PPO | Admitting: Family Medicine

## 2023-02-03 ENCOUNTER — Encounter: Payer: Self-pay | Admitting: Family Medicine

## 2023-02-03 VITALS — BP 127/84 | HR 91 | Ht 65.0 in | Wt 208.0 lb

## 2023-02-03 DIAGNOSIS — M9903 Segmental and somatic dysfunction of lumbar region: Secondary | ICD-10-CM

## 2023-02-03 DIAGNOSIS — G8929 Other chronic pain: Secondary | ICD-10-CM

## 2023-02-03 DIAGNOSIS — M9902 Segmental and somatic dysfunction of thoracic region: Secondary | ICD-10-CM | POA: Diagnosis not present

## 2023-02-03 DIAGNOSIS — M545 Low back pain, unspecified: Secondary | ICD-10-CM

## 2023-02-03 DIAGNOSIS — M9904 Segmental and somatic dysfunction of sacral region: Secondary | ICD-10-CM | POA: Diagnosis not present

## 2023-02-03 DIAGNOSIS — M9901 Segmental and somatic dysfunction of cervical region: Secondary | ICD-10-CM

## 2023-02-03 DIAGNOSIS — M9908 Segmental and somatic dysfunction of rib cage: Secondary | ICD-10-CM | POA: Diagnosis not present

## 2023-02-03 MED ORDER — TIZANIDINE HCL 2 MG PO TABS: 2.0000 mg | ORAL_TABLET | Freq: Every day | ORAL | 0 refills | Status: DC

## 2023-02-03 NOTE — Progress Notes (Signed)
EPIC Encounter for ICM Monitoring  Patient Name: [name] is a 47 y.o. female Date: 02/03/2023 Primary Care Physican: Myrlene Broker, MD Primary Cardiologist: Bensimhon Electrophysiologist: Graciela Husbands 11/19/2023 Weight: 214 lbs      04/08/2022 Weight: 190-194 lbs      05/13/2022 Weight: 193 lbs     09/15/2022 Weight: 206 lbs   (baseline closer to 198 lbs)      11/27/2022 Weight: 205 lbs          12/28/2022 Weight: 218 lbs       01/27/2023 Weight: 200 lbs     02/03/2023 Weight: 200 lbs   Spoke with patient and heart failure questions reviewed.  Transmission results reviewed.  Pt asymptomatic for fluid accumulation.  Advised Robbie Lis with HF clinic has recommended she take Metolazone 2.5 mg 1 tablet for weight gain of 5 lbs within a week (see 9/3 ICM note for recommendations).    Heartlogic HF Index remains stable at 0 suggesting normal fluid levels.          Prescribed: Torsemide 20 mg Take 2 tablets (40 mg total) by mouth twice a day Potassium 20 mEq take 3 tablet(s) (60 mEq total) by mouth twice a day Spironolactone 25 mg take 1 tablet (25 mg total) at bedtime.    Labs: 01/21/2023 Creatinine 0.90, BUN 7,   Potassium 4.2, Sodium 141, BNP 1,167.9  10/28/2022 Creatinine 0.83, BUN 9,   Potassium 3.1, Sodium 136, GFR >60 10/23/2022 Creatinine 0.98, BUN 5,   Potassium 3.4, Sodium 137, GFR >60 07/30/2022 Creatinine 0.85, BUN 6,   Potassium 3.6, Sodium 136, GFR >60 07/03/2022 Creatinine 0.93, BUN 5,   Potassium 3.6, Sodium 136, GFR 76 A complete set of results can be found in Results Review.   Recommendations:  No changes and encouraged to call if experiencing any fluid symptoms.   Follow-up plan: ICM clinic phone appointment on 03/08/2023.  91 day device clinic remote transmission 03/24/2023.              EP/Cardiology next office visit:  Recall 05/27/2023 with Otilio Saber, PA.  02/19/2023 with HF clinic.            Copy of ICM check sent to Dr. Graciela Husbands.   3 Month HeartLogicT  Heart Failure Index:    8 Day Data Trend:          Karie Soda, RN 02/03/2023 12:42 PM

## 2023-02-03 NOTE — Assessment & Plan Note (Signed)
Seems to be more of an aggravation of the sacroiliac joint again.  Discussed with patient about continuing the core strengthening.  Patient has done extremely well with weight loss.  We discussed different things such as social determinant of health and patient does have significant stress as well as a high risk of depression per patient's primary care.  Did not want to make any massive changes in medications but given a muscle relaxer for breakthrough pain.  Discussed icing regimen and home exercises.  Increase activity slowly.  Follow-up again in 6 to 8 weeks

## 2023-02-03 NOTE — Patient Instructions (Addendum)
Zanaflex 2mg  at night See me again in 8 weeks if needed

## 2023-02-05 ENCOUNTER — Encounter: Payer: Self-pay | Admitting: Internal Medicine

## 2023-02-11 ENCOUNTER — Ambulatory Visit: Payer: PPO | Admitting: Family Medicine

## 2023-02-18 ENCOUNTER — Telehealth (HOSPITAL_COMMUNITY): Payer: Self-pay

## 2023-02-18 NOTE — Telephone Encounter (Signed)
Called and left patient a voice message to confirm/remind patient of their appointment at the [facility] on 02/19/23 .   And to bring in all medications and/or complete list.

## 2023-02-19 ENCOUNTER — Encounter (HOSPITAL_COMMUNITY): Payer: PPO

## 2023-02-23 ENCOUNTER — Encounter: Payer: Self-pay | Admitting: Internal Medicine

## 2023-02-23 ENCOUNTER — Ambulatory Visit: Payer: PPO | Admitting: Internal Medicine

## 2023-03-02 NOTE — Progress Notes (Signed)
[name] [address] [place] [address] Phone: [phone] Subjective:   [name], am serving as a scribe for Dr. Antoine Primas.  I'm seeing this patient by the request  of:  Myrlene Broker, MD  CC: Neck pain follow-up  [id number]  [name] is a 47 y.o. female coming in with complaint of back and neck pain. OMT 02/03/2023. Patient states that she is having R arm numbness into her hand. Had a massage that helped her pain. Notices weakness in R hand. Pain in R trap.   Medications patient has been prescribed: None  Taking:         Reviewed prior external information including notes and imaging from previsou exam, outside providers and external EMR if available.   As well as notes that were available from care everywhere and other healthcare systems.  Past medical history, social, surgical and family history all reviewed in electronic medical record.  No pertanent information unless stated regarding to the chief complaint.   Past Medical History:  Diagnosis Date   Acute on chronic systolic (congestive) heart failure (HCC) 04/29/2017   Acute pain of right shoulder 06/16/2017   AICD (automatic cardioverter/defibrillator) present    Anginal pain (HCC)    Anxiety    Arthritis    right shoulder    Asthma    CHF (congestive heart failure) (HCC)    Chronic combined systolic and diastolic heart failure (HCC) 03/05/2014   Closed low lateral malleolus fracture 10/23/2013   Cystitis 10/21/2017   Depression    Depression with anxiety 01/20/2013   Diabetes mellitus without complication (HCC)    Diverticulosis    Dyspnea    comes and goes intermittently mostly with exertion    Dysrhythmia    Essential hypertension    Prev followed by [facility]/ Cardiology    Fibroid    age 48   Gallstones    Generalized abdominal cramping    History of cardiomyopathy    Hypertension    IBS (irritable bowel syndrome)    ICD  (implantable cardioverter-defibrillator), single, 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 CPAP intolerance >> "smothering" Good tolerance of auto device (optimal pressure 12-13 on download).  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   Aspirin Other (See Comments)    Wheezing, (Pt states that she just wheezes some when she takes aspirin by itself but she can take aspirin in a combination product).   Contrast Media [Iodinated Contrast Media] Other (  See Comments)    Multiple CT contrast studies done over 2 weeks caused ARF   Ciprofloxacin Itching and Rash   Cyclobenzaprine Other (See Comments)    Can not tolerate   [place] [Dapagliflozin] Rash   Sulfa Antibiotics Itching and Rash     Review of Systems:  No headache, visual changes, nausea, vomiting, diarrhea, constipation, dizziness, abdominal pain, skin rash, fevers, chills, night sweats, weight loss, swollen lymph nodes, body aches, joint swelling, chest pain, shortness of breath, mood changes. POSITIVE muscle aches  Objective  Blood  pressure 128/84, pulse 74, height 5\' 5"  (1.651 m), weight 220 lb (99.8 kg), SpO2 98%.   General: No apparent distress alert and oriented x3 mood and affect normal, dressed appropriately.  HEENT: Pupils equal, extraocular movements intact  Respiratory: [name] speak in full sentences and does not appear short of breath  Cardiovascular: No lower extremity edema, non tender, no erythema  Neck exam does have some loss lordosis noted.  There is some some worsening pain with extension of the neck.  Some limited sidebending bilaterally.  Tightness noted in the right greater than left  Osteopathic findings  C2 flexed rotated and side bent right C6 flexed rotated and side bent right T1 extended rotated and side bent right with elevated first rib T9 extended rotated and side bent right L2 flexed rotated and side bent right Sacrum right on right    Assessment and Plan:  Cervical disc disorder with radiculopathy of cervical region Believe patient is having more of a cervical radiculopathy.  Discussed with patient that icing regimen and home exercises, which activities to do and which ones to avoid, increase activity slowly over the course of next several weeks.  Follow-up again in 6 to 8 weeks patient responded well to osteopathic evaluation.  Will get x-rays to further evaluate for any bony abnormality that is contributing.    Nonallopathic problems  Decision today to treat with OMT was based on Physical Exam  After verbal consent patient was treated with HVLA, ME, FPR techniques in cervical, rib, thoracic, lumbar, and sacral  areas  Patient tolerated the procedure well with improvement in symptoms  Patient given exercises, stretches and lifestyle modifications  See medications in patient instructions if given  Patient will follow up in 4-8 weeks    The above documentation has been reviewed and is accurate and complete Judi Saa, DO          Note: This dictation was  prepared with Dragon dictation along with smaller phrase technology. Any transcriptional errors that result from this process are unintentional.

## 2023-03-04 ENCOUNTER — Telehealth (HOSPITAL_COMMUNITY): Payer: Self-pay

## 2023-03-04 NOTE — Telephone Encounter (Signed)
Called to confirm/remind patient of their appointment at the [facility] on 03/05/23.   And to bring in all medications and/or complete list.

## 2023-03-05 ENCOUNTER — Encounter (HOSPITAL_COMMUNITY): Payer: PPO

## 2023-03-09 ENCOUNTER — Ambulatory Visit: Payer: PPO | Admitting: Family Medicine

## 2023-03-09 ENCOUNTER — Ambulatory Visit (INDEPENDENT_AMBULATORY_CARE_PROVIDER_SITE_OTHER): Payer: PPO

## 2023-03-09 ENCOUNTER — Encounter: Payer: Self-pay | Admitting: Family Medicine

## 2023-03-09 VITALS — BP 128/84 | HR 74 | Ht 65.0 in | Wt 220.0 lb

## 2023-03-09 DIAGNOSIS — M4802 Spinal stenosis, cervical region: Secondary | ICD-10-CM | POA: Diagnosis not present

## 2023-03-09 DIAGNOSIS — M542 Cervicalgia: Secondary | ICD-10-CM

## 2023-03-09 DIAGNOSIS — M9902 Segmental and somatic dysfunction of thoracic region: Secondary | ICD-10-CM | POA: Diagnosis not present

## 2023-03-09 DIAGNOSIS — M9904 Segmental and somatic dysfunction of sacral region: Secondary | ICD-10-CM

## 2023-03-09 DIAGNOSIS — M9908 Segmental and somatic dysfunction of rib cage: Secondary | ICD-10-CM | POA: Diagnosis not present

## 2023-03-09 DIAGNOSIS — M47812 Spondylosis without myelopathy or radiculopathy, cervical region: Secondary | ICD-10-CM | POA: Diagnosis not present

## 2023-03-09 DIAGNOSIS — M5030 Other cervical disc degeneration, unspecified cervical region: Secondary | ICD-10-CM | POA: Diagnosis not present

## 2023-03-09 DIAGNOSIS — M9901 Segmental and somatic dysfunction of cervical region: Secondary | ICD-10-CM | POA: Diagnosis not present

## 2023-03-09 DIAGNOSIS — M25511 Pain in right shoulder: Secondary | ICD-10-CM | POA: Diagnosis not present

## 2023-03-09 DIAGNOSIS — M19011 Primary osteoarthritis, right shoulder: Secondary | ICD-10-CM | POA: Diagnosis not present

## 2023-03-09 DIAGNOSIS — M5010 Cervical disc disorder with radiculopathy, unspecified cervical region: Secondary | ICD-10-CM | POA: Diagnosis not present

## 2023-03-09 DIAGNOSIS — M9903 Segmental and somatic dysfunction of lumbar region: Secondary | ICD-10-CM | POA: Diagnosis not present

## 2023-03-09 NOTE — Patient Instructions (Addendum)
Xray today Watch positioning at night  6 week follow up

## 2023-03-09 NOTE — Assessment & Plan Note (Signed)
Believe patient is having more of a cervical radiculopathy.  Discussed with patient that icing regimen and home exercises, which activities to do and which ones to avoid, increase activity slowly over the course of next several weeks.  Follow-up again in 6 to 8 weeks patient responded well to osteopathic evaluation.  Will get x-rays to further evaluate for any bony abnormality that is contributing.

## 2023-03-10 ENCOUNTER — Encounter: Payer: Self-pay | Admitting: Family Medicine

## 2023-03-11 ENCOUNTER — Telehealth: Payer: Self-pay

## 2023-03-11 NOTE — Telephone Encounter (Signed)
LMOVM for pt to send missed ICM transmission with her home remote monitor.

## 2023-03-12 NOTE — Progress Notes (Signed)
No ICM remote transmission received for 03/08/2023 and next ICM transmission scheduled for 03/29/2023.

## 2023-03-15 ENCOUNTER — Ambulatory Visit: Payer: PPO | Admitting: Family Medicine

## 2023-03-18 ENCOUNTER — Encounter (HOSPITAL_COMMUNITY): Payer: Self-pay

## 2023-03-18 ENCOUNTER — Telehealth (HOSPITAL_COMMUNITY): Payer: Self-pay

## 2023-03-18 NOTE — Telephone Encounter (Signed)
Called and left patient a voice message to confirm/remind patient of their appointment at the [facility] on 03/19/23.   Patient reminded to bring in  all medications and/or complete list.

## 2023-03-19 ENCOUNTER — Encounter (HOSPITAL_COMMUNITY): Payer: PPO

## 2023-03-23 ENCOUNTER — Ambulatory Visit: Payer: PPO | Admitting: Family Medicine

## 2023-03-24 ENCOUNTER — Ambulatory Visit (INDEPENDENT_AMBULATORY_CARE_PROVIDER_SITE_OTHER): Payer: PPO

## 2023-03-24 DIAGNOSIS — I428 Other cardiomyopathies: Secondary | ICD-10-CM

## 2023-03-24 LAB — CUP PACEART REMOTE DEVICE CHECK
Battery Remaining Longevity: 102 mo
Battery Remaining Percentage: 73 %
Brady Statistic RV Percent Paced: 0 %
Date Time Interrogation Session: 20241030054300
HighPow Impedance: 99 Ohm
Implantable Lead Connection Status: 753985
Implantable Lead Implant Date: 20180312
Implantable Lead Location: 753860
Implantable Lead Model: 293
Implantable Lead Serial Number: 422842
Implantable Pulse Generator Implant Date: 20180312
Lead Channel Impedance Value: 628 Ohm
Lead Channel Setting Pacing Amplitude: 2.5 V
Lead Channel Setting Pacing Pulse Width: 0.4 ms
Lead Channel Setting Sensing Sensitivity: 0.5 mV
Pulse Gen Serial Number: 226601
Zone Setting Status: 755011

## 2023-03-29 ENCOUNTER — Telehealth: Payer: Self-pay

## 2023-03-29 NOTE — Telephone Encounter (Signed)
Attempted ICM Call.  Left message to send manual remote transmission for monthly fluid level check today.  Left call back number.

## 2023-03-31 ENCOUNTER — Ambulatory Visit: Payer: PPO | Attending: Internal Medicine

## 2023-03-31 DIAGNOSIS — I5042 Chronic combined systolic (congestive) and diastolic (congestive) heart failure: Secondary | ICD-10-CM

## 2023-03-31 DIAGNOSIS — Z95810 Presence of automatic (implantable) cardiac defibrillator: Secondary | ICD-10-CM

## 2023-03-31 NOTE — Progress Notes (Signed)
EPIC Encounter for ICM Monitoring  Patient Name: [name] is a 47 y.o. female Date: 03/31/2023 Primary Care Physican: Myrlene Broker, MD Primary Cardiologist: Bensimhon Electrophysiologist: Graciela Husbands 11/19/2023 Weight: 214 lbs      04/08/2022 Weight: 190-194 lbs      05/13/2022 Weight: 193 lbs     09/15/2022 Weight: 206 lbs   (baseline closer to 198 lbs)      11/27/2022 Weight: 205 lbs          12/28/2022 Weight: 218 lbs       01/27/2023 Weight: 200 lbs     02/03/2023 Weight: 200 lbs   Attempted call to patient and unable to reach.  Left detailed message per DPR regarding transmission. Transmission reviewed.    Heartlogic HF Index 55 suggesting possible fluid accumulation starting 10/14.       Prescribed: Torsemide 20 mg Take 2 tablets (40 mg total) by mouth twice a day Potassium 20 mEq take 3 tablet(s) (60 mEq total) by mouth twice a day Metolazone 2.5 mg take 1 tablet (2.5 mg total) as needed for weight gain >5 lbs in a week Spironolactone 25 mg take 1 tablet (25 mg total) at bedtime.    Labs: 01/21/2023 Creatinine 0.90, BUN 7,   Potassium 4.2, Sodium 141, BNP 1,167.9  10/28/2022 Creatinine 0.83, BUN 9,   Potassium 3.1, Sodium 136, GFR >60 10/23/2022 Creatinine 0.98, BUN 5,   Potassium 3.4, Sodium 137, GFR >60 07/30/2022 Creatinine 0.85, BUN 6,   Potassium 3.6, Sodium 136, GFR >60 07/03/2022 Creatinine 0.93, BUN 5,   Potassium 3.6, Sodium 136, GFR 76 A complete set of results can be found in Results Review.   Recommendations:  Left voice mail with ICM number and encouraged to call if experiencing any fluid symptoms.   Pt has PRN Metolazone to help manage fluid accumulation.     Follow-up plan: ICM clinic phone appointment on 04/06/2023 to recheck fluid levels  91 day device clinic remote transmission 06/23/2023.              EP/Cardiology next office visit:  Recall 05/27/2023 with Otilio Saber, PA.  No show at 10/25 OV with HF clinic, pt canceled 10/11 & 9/27 office visits with HF  clinic.            Copy of ICM check sent to Dr. Graciela Husbands. Will send copy to Prince Rome NP at [facility] clinic for review if patient is reached.   3 Month HeartLogicT Heart Failure Index:    8 Day Data Trend:          Karie Soda, RN 03/31/2023 8:12 AM

## 2023-04-06 ENCOUNTER — Ambulatory Visit (INDEPENDENT_AMBULATORY_CARE_PROVIDER_SITE_OTHER): Payer: PPO

## 2023-04-06 ENCOUNTER — Telehealth: Payer: Self-pay

## 2023-04-06 DIAGNOSIS — I5042 Chronic combined systolic (congestive) and diastolic (congestive) heart failure: Secondary | ICD-10-CM

## 2023-04-06 DIAGNOSIS — Z95810 Presence of automatic (implantable) cardiac defibrillator: Secondary | ICD-10-CM

## 2023-04-06 NOTE — Addendum Note (Signed)
Addended by: Karie Soda on: 04/06/2023 02:05 PM   Modules accepted: Level of Service

## 2023-04-06 NOTE — Progress Notes (Signed)
EPIC Encounter for ICM Monitoring  Patient Name: [name] is a 47 y.o. female Date: 04/06/2023 Primary Care Physican: Myrlene Broker, MD Primary Cardiologist: Bensimhon Electrophysiologist: Graciela Husbands 11/19/[birth year]21 Weight: 214 lbs      04/08/2022 Weight: 190-194 lbs      05/13/2022 Weight: 193 lbs     09/15/2022 Weight: 206 lbs   (baseline closer to 198 lbs)      11/27/2022 Weight: 205 lbs          12/28/2022 Weight: 218 lbs       01/27/2023 Weight: 200 lbs     02/03/2023 Weight: 200 lbs   Attempted call to patient and unable to reach.  Left detailed message per DPR regarding transmission. Transmission reviewed.    Heartlogic HF Index continues to increase.  Index increased from 55 to 57 suggesting possible fluid accumulation starting 10/14.       Prescribed: Torsemide 20 mg Take 2 tablets (40 mg total) by mouth twice a day Potassium 20 mEq take 3 tablet(s) (60 mEq total) by mouth twice a day Metolazone 2.5 mg take 1 tablet (2.5 mg total) as needed for weight gain >5 lbs in a week Spironolactone 25 mg take 1 tablet (25 mg total) at bedtime.    Labs: 01/21/2023 Creatinine 0.90, BUN 7,   Potassium 4.2, Sodium 141, BNP 1,167.9  10/28/2022 Creatinine 0.83, BUN 9,   Potassium 3.1, Sodium 136, GFR >60 10/23/2022 Creatinine 0.98, BUN 5,   Potassium 3.4, Sodium 137, GFR >60 07/30/2022 Creatinine 0.85, BUN 6,   Potassium 3.6, Sodium 136, GFR >60 07/03/2022 Creatinine 0.93, BUN 5,   Potassium 3.6, Sodium 136, GFR 76 A complete set of results can be found in Results Review.   Recommendations:  Left voice mail with ICM number and encouraged to call if experiencing any fluid symptoms.   Pt has PRN Metolazone to help manage fluid accumulation.     Follow-up plan: ICM clinic phone appointment on 04/12/2023 to recheck fluid levels  91 day device clinic remote transmission 06/23/2023.              EP/Cardiology next office visit:  Recall 05/27/2023 with Otilio Saber, PA.  No show at 10/25 OV with HF  clinic, pt canceled 10/11 & 9/27 office visits with HF clinic.            Copy of ICM check sent to Dr. Graciela Husbands. Will send copy to Prince Rome NP at [facility] clinic for review if patient is reached.   3 Month HeartLogicT Heart Failure Index:    8 Day Data Trend:          Karie Soda, RN 04/06/2023 1:57 PM

## 2023-04-06 NOTE — Telephone Encounter (Signed)
Remote ICM transmission received.  Attempted call to patient regarding ICM remote transmission and left detailed message per DPR.  Left ICM phone number and advised to return call for any fluid symptoms or questions. Next ICM remote transmission scheduled 04/12/2023.

## 2023-04-10 ENCOUNTER — Other Ambulatory Visit (HOSPITAL_COMMUNITY): Payer: Self-pay | Admitting: Cardiology

## 2023-04-12 ENCOUNTER — Ambulatory Visit: Payer: PPO | Attending: Internal Medicine

## 2023-04-12 ENCOUNTER — Other Ambulatory Visit: Payer: Self-pay

## 2023-04-12 DIAGNOSIS — Z95810 Presence of automatic (implantable) cardiac defibrillator: Secondary | ICD-10-CM

## 2023-04-12 DIAGNOSIS — I5042 Chronic combined systolic (congestive) and diastolic (congestive) heart failure: Secondary | ICD-10-CM

## 2023-04-12 NOTE — Progress Notes (Signed)
Remote ICD transmission.   

## 2023-04-14 ENCOUNTER — Telehealth: Payer: Self-pay

## 2023-04-14 NOTE — Telephone Encounter (Signed)
Remote ICM transmission received.  Attempted call to patient regarding ICM remote transmission and left detailed message per DPR.  Left ICM phone number and advised to return call for any fluid symptoms or questions.   Also advised can contact HF clinic directly for follow up if needed.

## 2023-04-14 NOTE — Progress Notes (Signed)
EPIC Encounter for ICM Monitoring  Patient Name: [name] is a 47 y.o. female Date: 04/14/2023 Primary Care Physican: Myrlene Broker, MD Primary Cardiologist: Bensimhon Electrophysiologist: Graciela Husbands 11[birth year]19/2023 Weight: 214 lbs      04/08/2022 Weight: 190-194 lbs      05/13/2022 Weight: 193 lbs     09/15/2022 Weight: 206 lbs   (baseline closer to 198 lbs)      11/27/2022 Weight: 205 lbs          12/28/2022 Weight: 218 lbs       01/27/2023 Weight: 200 lbs     02/03/2023 Weight: 200 lbs   Attempted call to patient and unable to reach.  Left detailed message per DPR regarding transmission. Transmission reviewed.    Heartlogic HF Index is 58 suggesting possible fluid accumulation starting 10/14.       Prescribed: Torsemide 20 mg Take 2 tablets (40 mg total) by mouth twice a day Potassium 20 mEq take 3 tablet(s) (60 mEq total) by mouth twice a day Metolazone 2.5 mg take 1 tablet (2.5 mg total) as needed for weight gain >5 lbs in a week Spironolactone 25 mg take 1 tablet (25 mg total) at bedtime.    Labs: 01/21/2023 Creatinine 0.90, BUN 7,   Potassium 4.2, Sodium 141, BNP 1,167.9  10/28/2022 Creatinine 0.83, BUN 9,   Potassium 3.1, Sodium 136, GFR >60 10/23/2022 Creatinine 0.98, BUN 5,   Potassium 3.4, Sodium 137, GFR >60 07/30/2022 Creatinine 0.85, BUN 6,   Potassium 3.6, Sodium 136, GFR >60 07/03/2022 Creatinine 0.93, BUN 5,   Potassium 3.6, Sodium 136, GFR 76 A complete set of results can be found in Results Review.   Recommendations:  Left voice mail with ICM number and encouraged to call if experiencing any fluid symptoms.   Pt has PRN Metolazone to help manage fluid accumulation.     Follow-up plan: ICM clinic phone appointment on 04/19/2023 to recheck fluid levels  91 day device clinic remote transmission 06/23/2023.              EP/Cardiology next office visit:  Recall 05/27/2023 with Otilio Saber, PA.  No show at 10/25 OV with HF clinic, pt canceled 10/11 & 9/27 office visits  with HF clinic.            Copy of ICM check sent to Dr. Graciela Husbands. Will send copy to Prince Rome NP at [facility] clinic for review if patient is reached.   3 Month HeartLogicT Heart Failure Index:    8 Day Data Trend:          Karie Soda, RN 04/14/2023 6:08 AM

## 2023-04-19 ENCOUNTER — Ambulatory Visit (INDEPENDENT_AMBULATORY_CARE_PROVIDER_SITE_OTHER): Payer: PPO

## 2023-04-19 DIAGNOSIS — Z95810 Presence of automatic (implantable) cardiac defibrillator: Secondary | ICD-10-CM

## 2023-04-19 DIAGNOSIS — I5042 Chronic combined systolic (congestive) and diastolic (congestive) heart failure: Secondary | ICD-10-CM

## 2023-04-21 NOTE — Progress Notes (Signed)
EPIC Encounter for ICM Monitoring  Patient Name: [name] is a 47 y.o. female Date: 04/21/2023 Primary Care Physican: Myrlene Broker, MD Primary Cardiologist: Bensimhon Electrophysiologist: Graciela Husbands 11/19/20[birth year] Weight: 214 lbs      04/08/2022 Weight: 190-194 lbs      05/13/2022 Weight: 193 lbs     09/15/2022 Weight: 206 lbs   (baseline closer to 198 lbs)      11/27/2022 Weight: 205 lbs          12/28/2022 Weight: 218 lbs       01/27/2023 Weight: 200 lbs     02/03/2023 Weight: 200 lbs 04/21/2023 Weight: 207 lbs (baseline 200-203)   Spoke with patient and heart failure questions reviewed.  Transmission results reviewed.  Pt gained 11 lbs over the last 2 weeks but has dropped about 4-5 of those pounds.  She does not feel like she has any fluid.  Denies SOB.         Heartlogic HF Index increased to 61 from 58 suggesting possible fluid accumulation starting 10/14.       Prescribed: Torsemide 20 mg Take 2 tablets (40 mg total) by mouth twice a day Potassium 20 mEq take 3 tablet(s) (60 mEq total) by mouth twice a day Metolazone 2.5 mg take 1 tablet (2.5 mg total) as needed for weight gain >5 lbs in a week Spironolactone 25 mg take 1 tablet (25 mg total) at bedtime.    Labs: 01/21/2023 Creatinine 0.90, BUN 7,   Potassium 4.2, Sodium 141, BNP 1,167.9  10/28/2022 Creatinine 0.83, BUN 9,   Potassium 3.1, Sodium 136, GFR >60 10/23/2022 Creatinine 0.98, BUN 5,   Potassium 3.4, Sodium 137, GFR >60 07/30/2022 Creatinine 0.85, BUN 6,   Potassium 3.6, Sodium 136, GFR >60 07/03/2022 Creatinine 0.93, BUN 5,   Potassium 3.6, Sodium 136, GFR 76 A complete set of results can be found in Results Review.   Recommendations:  Copy to Dr Gala Romney for review and recommendations.   Pts last dose of Metolazone was 11/24.   Pt has been taking Ibuprofen 600 mg daily for dental procedure daily starting Mid October and discontinued last week.  Advised not to restart Ibuprofen since this can exacerbate HF and  effect kidneys.   Advised patient should take PRN Metolazone 1 tablet with 2 additional Potassium tablets or use ER if needed over the holiday weekend.    Follow-up plan: ICM clinic phone appointment on 04/27/2023 to recheck fluid levels  91 day device clinic remote transmission 06/23/2023.              EP/Cardiology next office visit:  Recall 05/27/2023 with Otilio Saber, PA.  No show at 10/25 OV with HF clinic, pt canceled 10/11 & 9/27 office visits with HF clinic.            Copy of ICM check sent to Dr. Graciela Husbands.   3 Month HeartLogicT Heart Failure Index:    8 Day Data Trend:          Karie Soda, RN 04/21/2023 3:59 PM

## 2023-04-26 NOTE — Progress Notes (Signed)
  Received: 4 days ago Bensimhon, Bevelyn Buckles, MD  Kadden Osterhout, Josephine Igo, RN; Trevor Iha, Geroge Baseman, RN Thanks Jacki Cones.  Please forward me the updated interrogation next week.  Heather - any idea why she keep cancelling her visits?

## 2023-04-27 ENCOUNTER — Ambulatory Visit: Payer: PPO | Admitting: Family Medicine

## 2023-04-27 ENCOUNTER — Encounter: Payer: Self-pay | Admitting: Family Medicine

## 2023-04-28 ENCOUNTER — Telehealth (HOSPITAL_COMMUNITY): Payer: Self-pay | Admitting: *Deleted

## 2023-04-28 ENCOUNTER — Ambulatory Visit: Payer: PPO | Attending: Internal Medicine

## 2023-04-28 ENCOUNTER — Telehealth: Payer: Self-pay

## 2023-04-28 DIAGNOSIS — I5042 Chronic combined systolic (congestive) and diastolic (congestive) heart failure: Secondary | ICD-10-CM

## 2023-04-28 DIAGNOSIS — Z95810 Presence of automatic (implantable) cardiac defibrillator: Secondary | ICD-10-CM

## 2023-04-28 NOTE — Telephone Encounter (Signed)
-----   Message from Arvilla Meres sent at 04/22/2023  8:34 PM EST ----- Thanks Jacki Cones.   Please forward me the updated interrogation next week.   Jai Steil - any idea why she keep cancelling her visits? ----- Message ----- From: Karie Soda, RN Sent: 04/21/2023   4:19 PM EST To: Dolores Patty, MD  Please review.  Thank you!

## 2023-04-28 NOTE — Progress Notes (Signed)
EPIC Encounter for ICM Monitoring  Patient Name: [name] is a 47 y.o. female Date: 04/28/2023 Primary Care Physican: Myrlene Broker, MD Primary Cardiologist: Bensimhon Electrophysiologist: Graciela Husbands 11[birth year]19/2023 Weight: 214 lbs      04/08/2022 Weight: 190-194 lbs      05/13/2022 Weight: 193 lbs     09/15/2022 Weight: 206 lbs   (baseline closer to 198 lbs)      11/27/2022 Weight: 205 lbs          12/28/2022 Weight: 218 lbs       01/27/2023 Weight: 200 lbs     02/03/2023 Weight: 200 lbs 04/21/2023 Weight: 207 lbs (baseline 200-203)   Attempted call to patient and unable to reach.  Left detailed message per DPR regarding transmission. Transmission reviewed.    Heartlogic HF Index decreased from 58 to 43 suggesting possible fluid accumulation continues which started 10/14.    Thoracic impedance showing improvement and trending up.     Prescribed: Torsemide 20 mg Take 2 tablets (40 mg total) by mouth twice a day Potassium 20 mEq take 3 tablet(s) (60 mEq total) by mouth twice a day Metolazone 2.5 mg take 1 tablet (2.5 mg total) as needed for weight gain >5 lbs in a week Spironolactone 25 mg take 1 tablet (25 mg total) at bedtime.    Labs: 01/21/2023 Creatinine 0.90, BUN 7,   Potassium 4.2, Sodium 141, BNP 1,167.9  10/28/2022 Creatinine 0.83, BUN 9,   Potassium 3.1, Sodium 136, GFR >60 10/23/2022 Creatinine 0.98, BUN 5,   Potassium 3.4, Sodium 137, GFR >60 07/30/2022 Creatinine 0.85, BUN 6,   Potassium 3.6, Sodium 136, GFR >60 07/03/2022 Creatinine 0.93, BUN 5,   Potassium 3.6, Sodium 136, GFR 76 A complete set of results can be found in Results Review.   Recommendations:  Left voice mail with ICM number and encouraged to call if experiencing any fluid symptoms.  Left message encouraging to call HF clinic to schedule missed office visits.    Follow-up plan: ICM clinic phone appointment on 05/03/2023 for 31 day & to recheck fluid levels  91 day device clinic remote transmission 06/23/2023.               EP/Cardiology next office visit:  Recall 05/27/2023 with Otilio Saber, PA.  No show at 10/25 OV with HF clinic, pt canceled 10/11 & 9/27 office visits with HF clinic.            Copy of ICM check sent to Dr. Graciela Husbands.   3 Month HeartLogicT Heart Failure Index:    8 Day Data Trend:          Karie Soda, RN 04/28/2023 8:55 AM

## 2023-04-28 NOTE — Telephone Encounter (Signed)
Pts HeartLogic number was elevated from 61 to 58.   Pt cancelled [facility]HF Clinic appts on 9/27 and 10/11 and no-showed for appt on 10/25, nothing has been resch since then and last appt was 01/21/23.  Heartlogic now down to 43 (which is still elevated but trending in right direction), attempted to contact pt to f/u with her and sch f/u appt and Left message to call back, mychart mess also sent. Device clinic also left her VM today to return their call for f/u.

## 2023-04-28 NOTE — Telephone Encounter (Signed)
Remote ICM transmission received.  Attempted call to patient regarding ICM remote transmission and left detailed message per DPR and encouraged to return call.  Left ICM phone number and advised to return call for any fluid symptoms or questions.

## 2023-05-03 ENCOUNTER — Ambulatory Visit: Payer: PPO | Attending: Internal Medicine

## 2023-05-03 DIAGNOSIS — I5042 Chronic combined systolic (congestive) and diastolic (congestive) heart failure: Secondary | ICD-10-CM

## 2023-05-03 DIAGNOSIS — Z95810 Presence of automatic (implantable) cardiac defibrillator: Secondary | ICD-10-CM

## 2023-05-04 ENCOUNTER — Telehealth: Payer: Self-pay

## 2023-05-04 NOTE — Progress Notes (Signed)
EPIC Encounter for ICM Monitoring  Patient Name: [name] is a 47 y.o. female Date: 05/04/2023 Primary Care Physican: Myrlene Broker, MD Primary Cardiologist: Bensimhon Electrophysiologist: Graciela Husbands 11[birth year]19/2023 Weight: 214 lbs      04/08/2022 Weight: 190-194 lbs      05/13/2022 Weight: 193 lbs     09/15/2022 Weight: 206 lbs   (baseline closer to 198 lbs)      11/27/2022 Weight: 205 lbs          12/28/2022 Weight: 218 lbs       01/27/2023 Weight: 200 lbs     02/03/2023 Weight: 200 lbs 04/21/2023 Weight: 207 lbs (baseline 200-203)   Attempted call to patient and unable to reach.  Left detailed message per DPR regarding transmission. Transmission reviewed.    Heartlogic HF Index decreased from 43 to 13 suggesting fluid levels have returned to normal.     Prescribed: Torsemide 20 mg Take 2 tablets (40 mg total) by mouth twice a day Potassium 20 mEq take 3 tablet(s) (60 mEq total) by mouth twice a day Metolazone 2.5 mg take 1 tablet (2.5 mg total) as needed for weight gain >5 lbs in a week Spironolactone 25 mg take 1 tablet (25 mg total) at bedtime.    Labs: 01/21/2023 Creatinine 0.90, BUN 7,   Potassium 4.2, Sodium 141, BNP 1,167.9  10/28/2022 Creatinine 0.83, BUN 9,   Potassium 3.1, Sodium 136, GFR >60 10/23/2022 Creatinine 0.98, BUN 5,   Potassium 3.4, Sodium 137, GFR >60 07/30/2022 Creatinine 0.85, BUN 6,   Potassium 3.6, Sodium 136, GFR >60 07/03/2022 Creatinine 0.93, BUN 5,   Potassium 3.6, Sodium 136, GFR 76 A complete set of results can be found in Results Review.   Recommendations:  Left voice mail with ICM number and encouraged to call if experiencing any fluid symptoms.  Left message reporting index has returned to normal and encouraged to call HF clinic to schedule missed office visits.    Follow-up plan: ICM clinic phone appointment on 06/07/2023.  91 day device clinic remote transmission 06/23/2023.              EP/Cardiology next office visit:  Recall 05/27/2023 with Otilio Saber, PA.  No show at 10/25 OV with HF clinic, pt canceled 10/11 & 9/27 office visits with HF clinic.            Copy of ICM check sent to Dr. Graciela Husbands and Dr Gala Romney as Lorain Childes.   3 Month HeartLogicT Heart Failure Index:    8 Day Data Trend:          Karie Soda, RN 05/04/2023 4:51 PM

## 2023-05-04 NOTE — Telephone Encounter (Signed)
 Remote ICM transmission received.  Attempted call to patient regarding ICM remote transmission and left detailed message per DPR.  Left ICM phone number and advised to return call for any fluid symptoms or questions. Next ICM remote transmission scheduled 06/07/2023.

## 2023-05-06 ENCOUNTER — Ambulatory Visit: Payer: PPO

## 2023-05-06 ENCOUNTER — Encounter (HOSPITAL_COMMUNITY): Payer: Self-pay | Admitting: *Deleted

## 2023-05-06 VITALS — Ht 65.0 in | Wt 207.0 lb

## 2023-05-06 DIAGNOSIS — Z0000 Encounter for general adult medical examination without abnormal findings: Secondary | ICD-10-CM

## 2023-05-06 NOTE — Telephone Encounter (Signed)
Still unable to reach pt, she has not read mychart message or returned calls, Left message to call back again today and letter mailed to home address to call and sch appt.

## 2023-05-06 NOTE — Patient Instructions (Addendum)
[name] , Thank you for taking time to come for your Medicare Wellness Visit. I appreciate your ongoing commitment to your health goals. Please review the following plan we discussed and let me know if I can assist you in the future.   Referrals/Orders/Follow-Ups/Clinician Recommendations: You are due for a Flu vaccine and a Tetanus vaccine.  It was nice talking to you today.  Hope you and your family have  a Merry Christmas.  This is a list of the screening recommended for you and due dates:  Health Maintenance  Topic Date Due   Yearly kidney health urinalysis for diabetes  Never done   Hepatitis C Screening  Never done   Flu Shot  12/24/2022   DTaP/Tdap/Td vaccine (2 - Td or Tdap) 02/16/2023   COVID-19 Vaccine (3 - Pfizer risk series) 05/22/2023*   Yearly kidney function blood test for diabetes  01/21/2024   Medicare Annual Wellness Visit  05/05/2024   Pap with HPV screening  05/06/2025   Colon Cancer Screening  04/21/2026   HIV Screening  Completed   HPV Vaccine  Aged Out  *Topic was postponed. The date shown is not the original due date.    Advanced directives: (Copy Requested) Please bring a copy of your health care power of attorney and living will to the office to be added to your chart at your convenience.  Next Medicare Annual Wellness Visit scheduled for next year: Yes

## 2023-05-06 NOTE — Progress Notes (Signed)
Subjective:   [name] is a 47 y.o. female who presents for Medicare Annual (Subsequent) preventive examination.  Visit Complete: Virtual I connected with  [name] on 05/06/23 by a audio enabled telemedicine application and verified that I am speaking with the correct person using two identifiers.  Patient Location: [name]  Provider Location: Office/Clinic  I discussed the limitations of evaluation and management by telemedicine. The patient expressed understanding and agreed to proceed.  Vital Signs: Because this visit was a virtual/telehealth visit, some criteria may be missing or patient reported. Any vitals not documented were not able to be obtained and vitals that have been documented are patient reported.  Patient Medicare AWV questionnaire was completed by the patient on 05/06/2023; I have confirmed that all information answered by patient is correct and no changes since this date.  Cardiac Risk Factors include: advanced age (>38men, >31 women);hypertension;Other (see comment), Risk factor comments: CHF, NICM, ICD     Objective:    Today's Vitals   05/06/23 1349  Weight: 207 lb (93.9 kg)  Height: 5\' 5"  (1.651 m)   Body mass index is 34.45 kg/m.     05/06/2023    1:54 PM 10/29/2022   12:54 PM 10/28/2022    7:52 AM 10/23/2022   10:45 PM 04/28/2022    2:42 AM 12/15/2021    3:54 PM 12/14/2021    8:47 PM  Advanced Directives  Does Patient Have a Medical Advance Directive? No  No No No No No  Would patient like information on creating a medical advance directive?  No - Patient declined No - Patient declined No - Patient declined No - Patient declined Yes (Inpatient - patient defers creating a medical advance directive at this time - Information given) Yes (Inpatient - patient requests chaplain consult to create a medical advance directive)    Current Medications (verified) Outpatient Encounter Medications as of 05/06/2023  Medication Sig   acetaminophen (TYLENOL)  650 MG CR tablet Take 650 mg by mouth every 8 (eight) hours as needed for pain (headache).   albuterol (VENTOLIN HFA) 108 (90 Base) MCG/ACT inhaler Inhale 1-2 puffs into the lungs every 6 (six) hours as needed for wheezing or shortness of breath.   amiodarone (PACERONE) 200 MG tablet Take 1 tablet (200 mg total) by mouth daily.   budesonide-formoterol (SYMBICORT) 160-4.5 MCG/ACT inhaler Inhale 2 puffs into the lungs 2 (two) times daily as needed. (Patient taking differently: Inhale 2 puffs into the lungs 2 (two) times daily as needed (Asthma).)   CORLANOR 7.5 MG TABS tablet TAKE 1 TABLET BY MOUTH TWICE DAILY WITH A MEAL   dicyclomine (BENTYL) 20 MG tablet Take 1 tablet (20 mg total) by mouth daily as needed for spasms.   empagliflozin (JARDIANCE) 25 MG TABS tablet Take 1 tablet (25 mg total) by mouth daily before breakfast.   fluticasone (FLONASE) 50 MCG/ACT nasal spray Place 2 sprays into both nostrils daily as needed for allergies.   gabapentin (NEURONTIN) 300 MG capsule Take 1 capsule (300 mg total) by mouth at bedtime as needed (back spasms).   ibuprofen (ADVIL) 600 MG tablet Take 1 tablet (600 mg total) by mouth every 6 (six) hours. Take 1 tablet every 6 hours with food regardless of pain for 7 days   ipratropium-albuterol (DUONEB) 0.5-2.5 (3) MG/3ML SOLN Take 3 mLs by nebulization every 6 (six) hours as needed.   lidocaine (LIDODERM) 5 % Place 1 patch onto the skin daily. Remove & Discard patch within 12  hours or as directed by MD (Patient taking differently: Place 1 patch onto the skin daily as needed (Pain).)   Magnesium Oxide 200 MG TABS Take 1 tablet (200 mg total) by mouth daily.   metolazone (ZAROXOLYN) 2.5 MG tablet TAKE 1 TABLET BY MOUTH AS DIRECTED   metoprolol succinate (TOPROL-XL) 100 MG 24 hr tablet Take 1 tablet (100 mg total) by mouth daily. Take with or immediately following a meal.   mexiletine (MEXITIL) 150 MG capsule Take 2 capsules (300 mg total) by mouth 2 (two) times daily.    mirtazapine (REMERON) 15 MG tablet Take 1 tablet (15 mg total) by mouth at bedtime.   Multiple Vitamins-Minerals (MULTIVITAMIN GUMMIES WOMENS PO) Take 2 tablets by mouth daily.   naproxen sodium (ALEVE) 220 MG tablet Take 220-440 mg by mouth 2 (two) times daily as needed (Pain).   nitroGLYCERIN (NITROSTAT) 0.4 MG SL tablet Place 1 tablet (0.4 mg total) under the tongue every 5 (five) minutes as needed for chest pain.   potassium chloride SA (KLOR-CON M) 20 MEQ tablet Take 3 tablets (60 mEq total) by mouth 2 (two) times daily. (Patient taking differently: Take 40 mEq by mouth 2 (two) times daily.)   sacubitril-valsartan (ENTRESTO) 97-103 MG Take 1 tablet by mouth twice daily   spironolactone (ALDACTONE) 25 MG tablet Take 1 tablet (25 mg total) by mouth at bedtime.   tiZANidine (ZANAFLEX) 2 MG tablet Take 1 tablet (2 mg total) by mouth at bedtime.   Ubrogepant (UBRELVY) 100 MG TABS Take 1 tablet (100 mg total) by mouth daily as needed (migraine).   Vitamin D, Ergocalciferol, (DRISDOL) 1.25 MG (50000 UNIT) CAPS capsule TAKE 1 CAPSULE BY MOUTH EVERY 7 DAYS   torsemide (DEMADEX) 20 MG tablet Take 2 tablets (40 mg total) by mouth 2 (two) times daily. (Patient taking differently: Patient takes 1 tablet by mouth twice a day.)   No facility-administered encounter medications on file as of 05/06/2023.    Allergies (verified) Vancomycin, Aspirin, Contrast media [iodinated contrast media], Ciprofloxacin, Cyclobenzaprine, Farxiga [dapagliflozin], and Sulfa antibiotics   History: Past Medical History:  Diagnosis Date   Acute on chronic systolic (congestive) heart failure (HCC) 04/29/2017   Acute pain of right shoulder 06/16/2017   AICD (automatic cardioverter/defibrillator) present    Anginal pain (HCC)    Anxiety    Arthritis    right shoulder    Asthma    CHF (congestive heart failure) (HCC)    Chronic combined systolic and diastolic heart failure (HCC) 03/05/2014   Closed low lateral malleolus  fracture 10/23/2013   Cystitis 10/21/2017   Depression    Depression with anxiety 01/20/2013   Diabetes mellitus without complication (HCC)    Diverticulosis    Dyspnea    comes and goes intermittently mostly with exertion    Dysrhythmia    Essential hypertension    Prev followed by [name]    Fibroid    age 51   Gallstones    Generalized abdominal cramping    History of cardiomyopathy    Hypertension    IBS (irritable bowel syndrome)    ICD (implantable cardioverter-defibrillator), single, 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 CPAP intolerance >> "smothering" Good tolerance of auto device (optimal pressure 12-13 on download).  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   PVC (premature ventricular contraction) 06/23/2016   Seizures (HCC)    "as a child" (08/03/2016)   Termination of pregnancy    due to cardiac risk   Past Surgical History:  Procedure Laterality Date   BREAST BIOPSY Right 09/30/2022   [place] RT BREAST BX W LOC DEV 1ST LESION IMG BX SPEC US GUIDE 09/30/2022 [facility]   CARDIAC CATHETERIZATION N/A 11/11/2015   Procedure: Right/Left Heart Cath and Coronary Angiography;  Surgeon: Lennette Bihari, MD;  Location: [facility]VASIVE CV LAB;  Service: Cardiovascular;  Laterality: N/A;   CARDIAC CATHETERIZATION  ~ 2015   CARDIAC DEFIBRILLATOR  PLACEMENT  08/03/2016   CESAREAN SECTION  1999   COLONOSCOPY WITH PROPOFOL N/A 04/21/2016   Procedure: COLONOSCOPY WITH PROPOFOL;  Surgeon: Beverley Fiedler, MD;  Location: [facility];  Service: Gastroenterology;  Laterality: N/A;   ESOPHAGOGASTRODUODENOSCOPY (EGD) WITH PROPOFOL N/A 04/21/2016   Procedure: ESOPHAGOGASTRODUODENOSCOPY (EGD) WITH PROPOFOL;  Surgeon: Beverley Fiedler, MD;  Location: [facility];  Service: Gastroenterology;  Laterality: N/A;   FOOT FRACTURE SURGERY Right ~ 2003   FRACTURE SURGERY     ICD IMPLANT N/A 08/03/2016   Procedure: ICD Implant;  Surgeon: Duke Salvia, MD;  Location: [facility] INVASIVE CV LAB;  Service: Cardiovascular;  Laterality: N/A;   IR ANGIOGRAM PELVIS SELECTIVE OR SUPRASELECTIVE  10/28/2022   IR ANGIOGRAM SELECTIVE EACH ADDITIONAL VESSEL  10/28/2022   IR ANGIOGRAM SELECTIVE EACH ADDITIONAL VESSEL  10/28/2022   IR ANGIOGRAM SELECTIVE EACH ADDITIONAL VESSEL  10/28/2022   IR EMBO TUMOR ORGAN ISCHEMIA INFARCT INC GUIDE ROADMAPPING  10/28/2022   IR US GUIDE VASC ACCESS LEFT  10/28/2022   IR US GUIDE VASC ACCESS RIGHT  10/28/2022   LAPAROSCOPIC CHOLECYSTECTOMY  12/2006   LAPAROSCOPIC GASTRIC SLEEVE RESECTION     LAPAROSCOPY ABDOMEN DIAGNOSTIC  2008   "cut bile duct w/gallbladder OR; had to go in later & fix leak; hospitalized for 2 months"   LEFT HEART CATHETERIZATION WITH CORONARY ANGIOGRAM N/A 02/26/2014   Procedure: LEFT HEART CATHETERIZATION WITH CORONARY ANGIOGRAM;  Surgeon: Corky Crafts, MD;  Location: [facility] CATH LAB;  Service: Cardiovascular;  Laterality: N/A;   OVARIAN CYST REMOVAL Right 1999   PVC ABLATION N/A 12/01/2017   Procedure: PVC ABLATION;  Surgeon: Marinus Maw, MD;  Location: [facility];  Service: Cardiovascular;  Laterality: N/A;   RIGHT HEART CATH N/A 08/05/2017   Procedure: RIGHT HEART CATH;  Surgeon: Dolores Patty, MD;  Location: [facility];  Service: Cardiovascular;  Laterality: N/A;   RIGHT HEART CATH N/A 11/16/2019    Procedure: RIGHT HEART CATH;  Surgeon: Dolores Patty, MD;  Location: [facility];  Service: Cardiovascular;  Laterality: N/A;   TUBAL LIGATION  1999   Family History  Problem Relation Age of Onset   Emphysema Maternal Grandmother        smoked   Heart disease Maternal Grandmother 31       MI   Rheum arthritis Mother  Allergies Daughter    Colon cancer Neg Hx    Social History   Socioeconomic History   Marital status: Married    Spouse name: Reita Cliche   Number of children: 1   Years of education: Not on file   Highest education level: Associate degree: occupational, Scientist, product/process development, or vocational program  Occupational History   Occupation: [name]  Tobacco Use   Smoking status: Never   Smokeless tobacco: Never  Vaping Use   Vaping status: Never Used  Substance and Sexual Activity   Alcohol use: No   Drug use: No   Sexual activity: Yes    Birth control/protection: Surgical, Pill    Comment: BTL  Other Topics Concern   Not on file  Social History Narrative   Lives with husband, daughter niece and great nephew   Social Drivers of Corporate investment banker Strain: Low Risk  (05/06/2023)   Overall Financial Resource Strain (CARDIA)    Difficulty of Paying Living Expenses: Not hard at all  Food Insecurity: No Food Insecurity (05/06/2023)   Hunger Vital Sign    Worried About Running Out of Food in the Last Year: Never true    Ran Out of Food in the Last Year: Never true  Transportation Needs: No Transportation Needs (05/06/2023)   PRAPARE - Administrator, Civil Service (Medical): No    Lack of Transportation (Non-Medical): No  Physical Activity: Insufficiently Active (05/06/2023)   Exercise Vital Sign    Days of Exercise per Week: 1 day    Minutes of Exercise per Session: 10 min  Stress: Stress Concern Present (05/06/2023)   [employer] of Occupational Health - Occupational Stress Questionnaire    Feeling of Stress : Very much   Social Connections: Socially Isolated (05/06/2023)   Social Connection and Isolation Panel [NHANES]    Frequency of Communication with Friends and Family: Never    Frequency of Social Gatherings with Friends and Family: Never    Attends Religious Services: Never    Database administrator or Organizations: No    Attends Engineer, structural: Never    Marital Status: Married    Tobacco Counseling Counseling given: Not Answered   Clinical Intake:  Pre-visit preparation completed: Yes  Pain : No/denies pain     BMI - recorded: 34.45 Nutritional Status: BMI > 30  Obese Nutritional Risks: None  How often do you need to have someone help you when you read instructions, pamphlets, or other written materials from your doctor or pharmacy?: 1 - Never         Activities of Daily Living    05/06/2023    4:00 AM 10/29/2022   12:53 PM  In your present state of health, do you have any difficulty performing the following activities:  Hearing? 0   Vision? 0   Difficulty concentrating or making decisions? 1   Walking or climbing stairs? 0   Dressing or bathing? 0   Doing errands, shopping? 1 0  Preparing Food and eating ? N   Using the Toilet? N   In the past six months, have you accidently leaked urine? Y   Do you have problems with loss of bowel control? N   Managing your Medications? Y   Managing your Finances? N   Housekeeping or managing your Housekeeping? Y     Patient Care Team: Myrlene Broker, MD as PCP - General (Internal Medicine) Bensimhon, Bevelyn Buckles, MD as PCP - Advanced  Heart Failure (Cardiology) Lyn Records, MD (Inactive) as PCP - Cardiology (Cardiology) Marinus Maw, MD as PCP - Electrophysiology (Cardiology) Nyoka Cowden, MD (Pulmonary Disease) Milagros Evener, MD (Psychiatry) Jens Som Madolyn Frieze, MD (Cardiology) Oretha Milch, MD (Pulmonary Disease) Kathyrn Sheriff, [facility] (Inactive) as Pharmacist (Pharmacist) Patton Salles, MD as Consulting Physician (Obstetrics and Gynecology)  Indicate any recent Medical Services you may have received from other than [facility] providers in the past year (date may be approximate).     Assessment:   This is a routine wellness examination for [name].  Hearing/Vision screen Hearing Screening - Comments:: Denies hearing difficulties   Vision Screening - Comments:: Wears eyeglasses   Goals Addressed   None   Depression Screen    05/06/2023    1:55 PM 12/25/2022    9:09 AM 08/18/2022    2:06 PM 05/04/2022    1:54 PM 03/10/2022   10:37 AM 09/11/2021   11:10 AM 02/28/2021   11:04 AM  PHQ 2/9 Scores  PHQ - 2 Score 3 5 6  0 1  0  PHQ- 9 Score 12 19 22  0 10  0  Exception Documentation      Other- indicate reason in comment box   Not completed      situational depresssion.     Fall Risk    05/06/2023    4:00 AM 12/25/2022    9:09 AM 08/18/2022    2:06 PM 05/04/2022    1:54 PM 03/10/2022   10:37 AM  Fall Risk   Falls in the past year? 1 0 1 0 1  Number falls in past yr: 0 0 0 0 0  Injury with Fall? 1 0 0 0 0  Risk for fall due to :   No Fall Risks  No Fall Risks  Follow up Falls evaluation completed;Falls prevention discussed Falls evaluation completed Falls evaluation completed Falls evaluation completed Falls evaluation completed    MEDICARE RISK AT [name]: Medicare Risk at [name] Any stairs in or around the [name]?: Yes If so, are there any without handrails?: No [name] free of loose throw rugs in walkways, pet beds, electrical cords, etc?: Yes Adequate lighting in your [name] to reduce risk of falls?: Yes Life alert?: Yes Use of a cane, walker or w/c?: No Grab bars in the bathroom?: No Shower chair or bench in shower?: No Elevated toilet seat or a handicapped toilet?: No  TIMED UP AND GO:  Was the test performed?  No    Cognitive Function:        05/06/2023    1:58 PM  6CIT Screen  What Year? 0 points  What month? 0 points  What time? 0 points  Count  back from 20 0 points  Months in reverse 0 points  Repeat phrase 0 points  Total Score 0 points    Immunizations Immunization History  Administered Date(s) Administered   Influenza Split 03/05/2013   Influenza,inj,Quad PF,6+ Mos 01/17/2014, 01/30/2015   PFIZER Comirnaty(Gray Top)Covid-19 Tri-Sucrose Vaccine 08/27/2020   PFIZER(Purple Top)SARS-COV-2 Vaccination 09/24/2020   Pneumococcal Polysaccharide-23 02/22/2014   Tdap 02/15/2013    TDAP status: Due, Education has been provided regarding the importance of this vaccine. Advised may receive this vaccine at local pharmacy or Health Dept. Aware to provide a copy of the vaccination record if obtained from local pharmacy or Health Dept. Verbalized acceptance and understanding.  Flu Vaccine status: Due, Education has been provided regarding the importance of this vaccine. Advised may  receive this vaccine at local pharmacy or Health Dept. Aware to provide a copy of the vaccination record if obtained from local pharmacy or Health Dept. Verbalized acceptance and understanding.   Covid-19 vaccine status: Declined, Education has been provided regarding the importance of this vaccine but patient still declined. Advised may receive this vaccine at local pharmacy or Health Dept.or vaccine clinic. Aware to provide a copy of the vaccination record if obtained from local pharmacy or Health Dept. Verbalized acceptance and understanding.  Qualifies for Shingles Vaccine? No   Zostavax completed No   Shingrix Completed?: No.    Education has been provided regarding the importance of this vaccine. Patient has been advised to call insurance company to determine out of pocket expense if they have not yet received this vaccine. Advised may also receive vaccine at local pharmacy or Health Dept. Verbalized acceptance and understanding.  Screening Tests Health Maintenance  Topic Date Due   Diabetic kidney evaluation - Urine ACR  Never done   Hepatitis C  Screening  Never done   INFLUENZA VACCINE  12/24/2022   DTaP/Tdap/Td (2 - Td or Tdap) 02/16/2023   COVID-19 Vaccine (3 - Pfizer risk series) 05/22/2023 (Originally 10/22/2020)   Diabetic kidney evaluation - eGFR measurement  01/21/2024   Medicare Annual Wellness (AWV)  05/05/2024   Cervical Cancer Screening (HPV/Pap Cotest)  05/06/2025   Colonoscopy  04/21/2026   HIV Screening  Completed   HPV VACCINES  Aged Out    Health Maintenance  Health Maintenance Due  Topic Date Due   Diabetic kidney evaluation - Urine ACR  Never done   Hepatitis C Screening  Never done   INFLUENZA VACCINE  12/24/2022   DTaP/Tdap/Td (2 - Td or Tdap) 02/16/2023    Colorectal cancer screening: Type of screening: Colonoscopy. Completed 04/21/2016. Repeat every 10 years  Mammogram status: Completed 09/17/2022. Repeat every year   Lung Cancer Screening: (Low Dose CT Chest recommended if Age 41-80 years, 20 pack-year currently smoking OR have quit w/in 15years.) does not qualify.   Lung Cancer Screening Referral: N/A  Additional Screening:  Hepatitis C Screening: does not qualify;   Vision Screening: Recommended annual ophthalmology exams for early detection of glaucoma and other disorders of the eye. Is the patient up to date with their annual eye exam?  Yes  Who is the provider or what is the name of the office in which the patient attends annual eye exams? [facility]  Dr. Katrinka Blazing If pt is not established with a provider, would they like to be referred to a provider to establish care? No .   Dental Screening: Recommended annual dental exams for proper oral hygiene  Community Resource Referral / Chronic Care Management: CRR required this visit?  No   CCM required this visit?  No     Plan:     I have personally reviewed and noted the following in the patient's chart:   Medical and social history Use of alcohol, tobacco or illicit drugs  Current medications and supplements including opioid  prescriptions. Patient is not currently taking opioid prescriptions. Functional ability and status Nutritional status Physical activity Advanced directives List of other physicians Hospitalizations, surgeries, and ER visits in previous 12 months Vitals Screenings to include cognitive, depression, and falls Referrals and appointments  In addition, I have reviewed and discussed with patient certain preventive protocols, quality metrics, and best practice recommendations. A written personalized care plan for preventive services as well as general preventive health recommendations were provided  to patient.     Lando Alcalde L Kelsy Polack, CMA   05/06/2023   After Visit Summary: (MyChart) Due to this being a telephonic visit, the after visit summary with patients personalized plan was offered to patient via MyChart   Nurse Notes: Patient is due for a Tdap and Flu vaccine and a UARC.  She has made an 3 month follow up appointment with provider to discuss GLP1 medications covered through Medicare.  Patient is also due for a UACR and a Hep C screening.  Patient had no other concerns to address today.

## 2023-05-06 NOTE — Telephone Encounter (Signed)
Pt returned call and f/u scheduled

## 2023-05-12 ENCOUNTER — Encounter (HOSPITAL_COMMUNITY): Payer: Self-pay

## 2023-05-12 ENCOUNTER — Other Ambulatory Visit: Payer: Self-pay

## 2023-05-12 ENCOUNTER — Emergency Department (HOSPITAL_COMMUNITY): Payer: PPO

## 2023-05-12 ENCOUNTER — Emergency Department (HOSPITAL_COMMUNITY)
Admission: EM | Admit: 2023-05-12 | Discharge: 2023-05-13 | Disposition: A | Discharge status: Home / Self Care | Payer: PPO | Attending: Emergency Medicine | Admitting: Emergency Medicine

## 2023-05-12 DIAGNOSIS — R079 Chest pain, unspecified: Secondary | ICD-10-CM | POA: Insufficient documentation

## 2023-05-12 DIAGNOSIS — Z79899 Other long term (current) drug therapy: Secondary | ICD-10-CM | POA: Insufficient documentation

## 2023-05-12 DIAGNOSIS — I110 Hypertensive heart disease with heart failure: Secondary | ICD-10-CM | POA: Diagnosis not present

## 2023-05-12 DIAGNOSIS — I509 Heart failure, unspecified: Secondary | ICD-10-CM | POA: Diagnosis not present

## 2023-05-12 DIAGNOSIS — R0789 Other chest pain: Secondary | ICD-10-CM | POA: Diagnosis not present

## 2023-05-12 DIAGNOSIS — J9811 Atelectasis: Secondary | ICD-10-CM | POA: Diagnosis not present

## 2023-05-12 LAB — HCG, SERUM, QUALITATIVE: Preg, Serum: NEGATIVE

## 2023-05-12 LAB — CBC
HCT: 42 % (ref 36.0–46.0)
Hemoglobin: 13 g/dL (ref 12.0–15.0)
MCH: 27 pg (ref 26.0–34.0)
MCHC: 31 g/dL (ref 30.0–36.0)
MCV: 87.1 fL (ref 80.0–100.0)
Platelets: 215 10*3/uL (ref 150–400)
RBC: 4.82 MIL/uL (ref 3.87–5.11)
RDW: 14.2 % (ref 11.5–15.5)
WBC: 6.1 10*3/uL (ref 4.0–10.5)
nRBC: 0 % (ref 0.0–0.2)

## 2023-05-12 LAB — BASIC METABOLIC PANEL
Anion gap: 12 (ref 5–15)
BUN: 13 mg/dL (ref 6–20)
CO2: 27 mmol/L (ref 22–32)
Calcium: 9.7 mg/dL (ref 8.9–10.3)
Chloride: 102 mmol/L (ref 98–111)
Creatinine, Ser: 1.01 mg/dL — ABNORMAL HIGH (ref 0.44–1.00)
GFR, Estimated: >60 mL/min (ref >60)
Glucose, Bld: 83 mg/dL (ref 70–99)
Potassium: 3.4 mmol/L — ABNORMAL LOW (ref 3.5–5.1)
Sodium: 141 mmol/L (ref 135–145)

## 2023-05-12 LAB — TROPONIN I (HIGH SENSITIVITY): Troponin I (High Sensitivity): 6 ng/L (ref <18)

## 2023-05-13 LAB — TROPONIN I (HIGH SENSITIVITY): Troponin I (High Sensitivity): 7 ng/L (ref <18)

## 2023-05-13 LAB — BRAIN NATRIURETIC PEPTIDE: B Natriuretic Peptide: 370.5 pg/mL — ABNORMAL HIGH (ref 0.0–100.0)

## 2023-05-13 LAB — MAGNESIUM: Magnesium: 2.2 mg/dL (ref 1.7–2.4)

## 2023-05-13 NOTE — ED Provider Notes (Signed)
EMERGENCY DEPARTMENT AT [facility] Provider Note   CSN: [id number] Arrival date & time: 05/12/23  2154     History  Chief Complaint  Patient presents with   Chest Pain    [name] is a 47 y.o. female.  [name] is a 47 y.o. female with a history of nonischemic cardiomyopathy, CHF, hypertension, sleep apnea, migraines, seizures, Zentz to the emergency department for evaluation of chest pain.  Chest pain started around 2 or 3 AM on Wednesday.  Pain has waxed and waned in intensity but has never completely gone away.  She describes it as a squeezing sensation on the left side of the chest and under the left breast.  Denies any associated shortness of breath, lightheadedness, syncope.  No nausea or vomiting, no abdominal pain.  She reports that on Tuesday she received a call that her fluid status was up a bit from her ICD and fluid monitor and her weight was higher, she was instructed to take a dose of metolazone in addition to her routine torsemide and had good response from metolazone and felt that her weight went down some but is still a bit higher than her base weight.  She is followed by Dr. Gala Romney with cardiology.  The history is provided by the patient and medical records.  Chest Pain Associated symptoms: no cough, no fever and no shortness of breath        Home Medications Prior to Admission medications   Medication Sig Start Date End Date Taking? Authorizing Provider  acetaminophen (TYLENOL) 650 MG CR tablet Take 650 mg by mouth every 8 (eight) hours as needed for pain (headache).   Yes [provider]  albuterol (VENTOLIN HFA) 108 (90 Base) MCG/ACT inhaler Inhale 1-2 puffs into the lungs every 6 (six) hours as needed for wheezing or shortness of breath. 01/07/23  Yes Myrlene Broker, MD  amiodarone (PACERONE) 200 MG tablet Take 1 tablet (200 mg total) by mouth daily. 02/04/21  Yes Graciella Freer, PA-C   budesonide-formoterol [facility]) 160-4.5 MCG/ACT inhaler Inhale 2 puffs into the lungs 2 (two) times daily as needed. Patient taking differently: Inhale 2 puffs into the lungs 2 (two) times daily as needed (Asthma). 03/13/22  Yes Myrlene Broker, MD  CORLANOR 7.5 MG TABS tablet TAKE 1 TABLET BY MOUTH TWICE DAILY WITH A MEAL 02/16/22  Yes Bensimhon, Bevelyn Buckles, MD  dicyclomine (BENTYL) 20 MG tablet Take 1 tablet (20 mg total) by mouth daily as needed for spasms. 05/30/20  Yes Myrlene Broker, MD  empagliflozin (JARDIANCE) 25 MG TABS tablet Take 1 tablet (25 mg total) by mouth daily before breakfast. 03/10/22  Yes Myrlene Broker, MD  fluticasone [facility]) 50 MCG/ACT nasal spray Place 2 sprays into both nostrils daily as needed for allergies. 03/13/22  Yes Myrlene Broker, MD  gabapentin (NEURONTIN) 300 MG capsule Take 1 capsule (300 mg total) by mouth at bedtime as needed (back spasms). 05/30/20  Yes Myrlene Broker, MD  ibuprofen (ADVIL) 600 MG tablet Take 1 tablet (600 mg total) by mouth every 6 (six) hours. Take 1 tablet every 6 hours with food regardless of pain for 7 days 10/29/22  Yes Shon Hough, NP  ipratropium-albuterol (DUONEB) 0.5-2.5 (3) MG/3ML SOLN Take 3 mLs by nebulization every 6 (six) hours as needed. 11/13/22  Yes Cathlyn Parsons, NP  Magnesium Oxide 200 MG TABS Take 1 tablet (200 mg total) by mouth daily. 05/30/20  Yes  Bensimhon, Bevelyn Buckles, MD  metolazone (ZAROXOLYN) 2.5 MG tablet TAKE 1 TABLET BY MOUTH AS DIRECTED Patient taking differently: Take 2.5 mg by mouth daily as needed (for swelling). TAKE 1 TABLET BY MOUTH AS DIRECTED 04/15/23  Yes Marinus Maw, MD  metoprolol succinate (TOPROL-XL) 100 MG 24 hr tablet Take 1 tablet (100 mg total) by mouth daily. Take with or immediately following a meal. 07/31/22  Yes Bensimhon, Bevelyn Buckles, MD  mexiletine (MEXITIL) 150 MG capsule Take 2 capsules (300 mg total) by mouth 2 (two) times daily. 06/03/22  Yes Milford, Anderson Malta, FNP  Multiple Vitamins-Minerals (MULTIVITAMIN GUMMIES WOMENS PO) Take 2 tablets by mouth daily.   Yes [provider]  naproxen sodium (ALEVE) 220 MG tablet Take 220-440 mg by mouth 2 (two) times daily as needed (Pain).   Yes [provider]  nitroGLYCERIN (NITROSTAT) 0.4 MG SL tablet Place 1 tablet (0.4 mg total) under the tongue every 5 (five) minutes as needed for chest pain. 06/20/21   Bensimhon, Bevelyn Buckles, MD  potassium chloride SA (KLOR-CON M) 20 MEQ tablet Take 3 tablets (60 mEq total) by mouth 2 (two) times daily. Patient taking differently: Take 40 mEq by mouth 2 (two) times daily. 12/28/22   Milford, Anderson Malta, FNP  sacubitril-valsartan (ENTRESTO) 97-103 MG Take 1 tablet by mouth twice daily 08/31/22   Bensimhon, Bevelyn Buckles, MD  spironolactone (ALDACTONE) 25 MG tablet Take 1 tablet (25 mg total) by mouth at bedtime. 01/14/21   Bensimhon, Bevelyn Buckles, MD  tiZANidine (ZANAFLEX) 2 MG tablet Take 1 tablet (2 mg total) by mouth at bedtime. 02/03/23   Judi Saa, DO  torsemide (DEMADEX) 20 MG tablet Take 2 tablets (40 mg total) by mouth 2 (two) times daily. Patient taking differently: Patient takes 1 tablet by mouth twice a day. 12/28/22 03/28/23  [name] Ganong, FNP  Ubrogepant (UBRELVY) 100 MG TABS Take 1 tablet (100 mg total) by mouth daily as needed (migraine). 01/07/23   Myrlene Broker, MD  Vitamin D, Ergocalciferol, (DRISDOL) 1.25 MG (50000 UNIT) CAPS capsule TAKE 1 CAPSULE BY MOUTH EVERY 7 DAYS 06/02/22   Myrlene Broker, MD      Allergies    Vancomycin, Aspirin, Contrast media [iodinated contrast media], Ciprofloxacin, Cyclobenzaprine, Farxiga [dapagliflozin], and Sulfa antibiotics    Review of Systems   Review of Systems  Constitutional:  Negative for chills and fever.  HENT: Negative.    Respiratory:  Negative for cough and shortness of breath.   Cardiovascular:  Positive for chest pain. Negative for leg swelling.  Neurological:  Negative for syncope and  light-headedness.  All other systems reviewed and are negative.   Physical Exam Updated Vital Signs BP (!) 140/100 (BP Location: Left Arm)   Pulse (!) 56   Temp 98.4 F (36.9 C) (Oral)   Resp 18   SpO2 100%  Physical Exam Vitals and nursing note reviewed.  Constitutional:      General: She is not in acute distress.    Appearance: Normal appearance. She is well-developed. She is not ill-appearing or diaphoretic.  HENT:     Head: Normocephalic and atraumatic.  Eyes:     General:        Right eye: No discharge.        Left eye: No discharge.     Pupils: Pupils are equal, round, and reactive to light.  Cardiovascular:     Rate and Rhythm: Normal rate and regular rhythm.     Pulses:  Normal pulses.          Radial pulses are 2+ on the right side and 2+ on the left side.       Dorsalis pedis pulses are 2+ on the right side and 2+ on the left side.     Heart sounds: Normal heart sounds.  Pulmonary:     Effort: Pulmonary effort is normal. No respiratory distress.     Breath sounds: Normal breath sounds. No wheezing or rales.     Comments: Respirations equal and unlabored, patient able to speak in full sentences, lungs clear to auscultation bilaterally  Abdominal:     General: Bowel sounds are normal. There is no distension.     Palpations: Abdomen is soft. There is no mass.     Tenderness: There is no abdominal tenderness. There is no guarding.     Comments: Abdomen soft, nondistended, nontender to palpation in all quadrants without guarding or peritoneal signs  Musculoskeletal:        General: No deformity.     Cervical back: Neck supple.     Right lower leg: No edema.     Left lower leg: No edema.  Skin:    General: Skin is warm and dry.     Capillary Refill: Capillary refill takes less than 2 seconds.  Neurological:     Mental Status: She is alert and oriented to person, place, and time.     Coordination: Coordination normal.     Comments: Speech is clear, able to follow  commands Moves extremities without ataxia, coordination intact  Psychiatric:        Mood and Affect: Mood normal.        Behavior: Behavior normal.     ED Results / Procedures / Treatments   Labs (all labs ordered are listed, but only abnormal results are displayed) Labs Reviewed  BASIC METABOLIC PANEL - Abnormal; Notable for the following components:      Result Value   Potassium 3.4 (*)    Creatinine, Ser 1.01 (*)    All other components within normal limits  BRAIN NATRIURETIC PEPTIDE - Abnormal; Notable for the following components:   B Natriuretic Peptide 370.5 (*)    All other components within normal limits  CBC  HCG, SERUM, QUALITATIVE  MAGNESIUM  TROPONIN I (HIGH SENSITIVITY)  TROPONIN I (HIGH SENSITIVITY)    EKG EKG Interpretation Date/Time:  Thursday May 13 2023 06:12:34 EST Ventricular Rate:  90 PR Interval:  186 QRS Duration:  90 QT Interval:  394 QTC Calculation: 481 R Axis:   -71  Text Interpretation: Electrical artifact No obvious STEMI PVCs When compared with ECG of 12-May-2023 22:11, PREVIOUS ECG IS PRESENT Similar artifact in prior tracings Confirmed by Alona Bene [phone]) on 05/13/2023 6:38:38 AM  Radiology DG Chest 2 View Result Date: 05/12/2023 CLINICAL DATA:  Encounter for chest pain EXAM: CHEST - 2 VIEW COMPARISON:  12/25/2022 FINDINGS: Left chest wall ICD. Right chest neurostimulator. Stable cardiomediastinal silhouette. Left basilar atelectasis. Otherwise no focal consolidation, pleural effusion, or pneumothorax. No displaced rib fractures. IMPRESSION: No active cardiopulmonary disease. Electronically Signed   By: Minerva Fester M.D.   On: 05/12/2023 23:07    Procedures Procedures    Medications Ordered in ED Medications - No data to display  ED Course/ Medical Decision Making/ A&P  Medical Decision Making Amount and/or Complexity of Data Reviewed Labs: ordered. Radiology: ordered.   Patient  presents to the emergency department with chest pain. Patient nontoxic appearing, in no apparent distress, vitals without significant abnormality. Fairly benign physical exam.   DDX including but not limited to: ACS, CHF, pulmonary embolism, dissection, pneumothorax, pneumonia, arrhythmia, severe anemia, MSK, GERD, anxiety, abdominal process.   Additional history obtained:  Chart & nursing note reviewed.  Notes from recent heart failure follow-up reviewed.  EKG: EKG with baseline artifact related to Barostim, no ischemic changes, sinus rhythm with PVCs.  Lab Tests:  I reviewed & interpreted labs including:  No leukocytosis and normal hemoglobin, cardiac enzymes negative x 2, BNP mildly elevated at 370.5 but this is lower than patient's typical baseline.  Creatinine is mildly increased from baseline at 1.01, typically 0.8-0.9, normal magnesium.  Imaging Studies ordered:  I ordered and viewed the following imaging, agree with radiologist impression:  Chest x-ray without pulmonary edema or other acute cardiopulmonary findings  ED Course:   EKG without obvious acute ischemia, delta troponin negative, doubt ACS. Patient is low risk wells, PERC negative, doubt pulmonary embolism. Pain is not a tearing sensation, symmetric pulses, no widening of mediastinum on CXR, doubt dissection. Cardiac monitor reviewed, no notable arrhythmias or tachycardia.  [name] weight is still slightly increased from baseline at 213 pounds today, typically around 205, despite this no pulmonary edema or significant lower extremity edema noted on exam.  Placed consult to cardiology, discussed with APP Evan with cardiology service, feels comfortable with discharge home, recommends patient call heart failure clinic for close follow-up and further recommendations.  Given slight bump in creatinine would hold off on additional metolazone for diuresis at this time.  Based on patient's chief complaint, I considered admission  might be necessary, however after reassuring ED workup feel patient is reasonable for discharge.   I discussed results, treatment plan, need for PCP follow-up, and return precautions with the patient. Provided opportunity for questions, patient confirmed understanding and is in agreement with plan.    Portions of this note were generated with Scientist, clinical (histocompatibility and immunogenetics). Dictation errors may occur despite best attempts at proofreading.         Final Clinical Impression(s) / ED Diagnoses Final diagnoses:  Left-sided chest pain    Rx / DC Orders ED Discharge Orders     None         Rosezella Rumpf, PA-C 05/13/23 1013    Edwin Dada P, DO 05/14/23 [phone]

## 2023-05-13 NOTE — Progress Notes (Signed)
Heart Failure Navigator Progress Note  Assessed for Heart & Vascular TOC clinic readiness.  Patient does not meet criteria due to Advanced Heart Failure Team patient of [name].   Navigator will sign off at this time.   Lucilia Yanni, BSN, RN Heart Failure Nurse Navigator Secure Chat Only   

## 2023-05-13 NOTE — Progress Notes (Signed)
Visited with patient and provided emotional and spiritual support. Prior to D/C.Marland Kitchen  Venida Jarvis, [place], [facility], Pager [phone]

## 2023-05-13 NOTE — ED Notes (Signed)
Pt c/o chest pain/ tightness getting much worse while in waiting room. Pt brought back to triage reassessed and repeat EKG completed

## 2023-05-13 NOTE — Discharge Instructions (Signed)
Your evaluation today was overall very reassuring.  Your heart enzymes were normal and did not show any evidence of heart attack.  From heart failure standpoint your weight is up a bit but your BNP levels looked good compared to prior.  Please call to touch base with heart failure clinic, return to the emergency department if you have worsening chest pain, shortness of breath or any other new or concerning symptoms.

## 2023-05-25 ENCOUNTER — Telehealth: Payer: Self-pay

## 2023-05-25 NOTE — Progress Notes (Signed)
 Transition Care Management Unsuccessful Follow-up Telephone Call  Date of discharge and from where:  05/13/2023 [facility]  Attempts:  1st Attempt  Reason for unsuccessful TCM follow-up call:  Left voice message  [name] [facility]  [facility], [facility] Resource Care Guide Direct Dial: [phone]  Website: [email or web address]

## 2023-05-28 ENCOUNTER — Telehealth: Payer: Self-pay

## 2023-05-28 NOTE — Progress Notes (Signed)
 Transition Care Management Unsuccessful Follow-up Telephone Call  Date of discharge and from where:  05/13/2023 [facility]  Attempts:  2nd Attempt  Reason for unsuccessful TCM follow-up call:  Left voice message  Garvin Ellena [facility]  [facility], [facility] Resource Care Guide Direct Dial: [phone]  Website: [email or web address]

## 2023-05-29 ENCOUNTER — Other Ambulatory Visit: Payer: Self-pay | Admitting: Internal Medicine

## 2023-05-31 ENCOUNTER — Other Ambulatory Visit: Payer: Self-pay | Admitting: Internal Medicine

## 2023-05-31 ENCOUNTER — Telehealth (HOSPITAL_COMMUNITY): Payer: Self-pay

## 2023-05-31 ENCOUNTER — Inpatient Hospital Stay (HOSPITAL_COMMUNITY): Admission: RE | Admit: 2023-05-31 | Payer: PPO | Source: Ambulatory Visit | Admitting: Internal Medicine

## 2023-05-31 NOTE — Telephone Encounter (Signed)
 Attempted to call patient to get her rescheduled for appointment today with Dr. Gala Romney- unable to reach- left message to call back to get scheduled

## 2023-06-01 ENCOUNTER — Ambulatory Visit: Payer: PPO | Admitting: Internal Medicine

## 2023-06-01 ENCOUNTER — Ambulatory Visit (HOSPITAL_COMMUNITY)
Admission: RE | Admit: 2023-06-01 | Discharge: 2023-06-01 | Disposition: A | Discharge status: Home / Self Care | Payer: PPO | Source: Ambulatory Visit | Attending: Internal Medicine | Admitting: Internal Medicine

## 2023-06-01 VITALS — HR 87 | Wt 214.6 lb

## 2023-06-01 DIAGNOSIS — Z9884 Bariatric surgery status: Secondary | ICD-10-CM | POA: Diagnosis not present

## 2023-06-01 DIAGNOSIS — I110 Hypertensive heart disease with heart failure: Secondary | ICD-10-CM | POA: Diagnosis not present

## 2023-06-01 DIAGNOSIS — E119 Type 2 diabetes mellitus without complications: Secondary | ICD-10-CM | POA: Diagnosis not present

## 2023-06-01 DIAGNOSIS — I5042 Chronic combined systolic (congestive) and diastolic (congestive) heart failure: Secondary | ICD-10-CM | POA: Diagnosis not present

## 2023-06-01 DIAGNOSIS — Z95810 Presence of automatic (implantable) cardiac defibrillator: Secondary | ICD-10-CM | POA: Insufficient documentation

## 2023-06-01 DIAGNOSIS — G4733 Obstructive sleep apnea (adult) (pediatric): Secondary | ICD-10-CM | POA: Insufficient documentation

## 2023-06-01 DIAGNOSIS — R008 Other abnormalities of heart beat: Secondary | ICD-10-CM | POA: Diagnosis not present

## 2023-06-01 DIAGNOSIS — I493 Ventricular premature depolarization: Secondary | ICD-10-CM | POA: Diagnosis not present

## 2023-06-01 DIAGNOSIS — Z6835 Body mass index (BMI) 35.0-35.9, adult: Secondary | ICD-10-CM

## 2023-06-01 DIAGNOSIS — F32A Depression, unspecified: Secondary | ICD-10-CM | POA: Diagnosis not present

## 2023-06-01 DIAGNOSIS — Z7984 Long term (current) use of oral hypoglycemic drugs: Secondary | ICD-10-CM | POA: Insufficient documentation

## 2023-06-01 DIAGNOSIS — Z79899 Other long term (current) drug therapy: Secondary | ICD-10-CM | POA: Diagnosis not present

## 2023-06-01 LAB — BASIC METABOLIC PANEL
Anion gap: 10 (ref 5–15)
BUN: 7 mg/dL (ref 6–20)
CO2: 28 mmol/L (ref 22–32)
Calcium: 9.3 mg/dL (ref 8.9–10.3)
Chloride: 100 mmol/L (ref 98–111)
Creatinine, Ser: 0.86 mg/dL (ref 0.44–1.00)
GFR, Estimated: >60 mL/min (ref >60)
Glucose, Bld: 100 mg/dL — ABNORMAL HIGH (ref 70–99)
Potassium: 3.2 mmol/L — ABNORMAL LOW (ref 3.5–5.1)
Sodium: 138 mmol/L (ref 135–145)

## 2023-06-01 LAB — BRAIN NATRIURETIC PEPTIDE: B Natriuretic Peptide: 220 pg/mL — ABNORMAL HIGH (ref 0.0–100.0)

## 2023-06-01 MED ORDER — MEXILETINE HCL 150 MG PO CAPS: 300.0000 mg | ORAL_CAPSULE | Freq: Two times a day (BID) | ORAL | 11 refills | Status: DC

## 2023-06-01 MED ORDER — METOLAZONE 2.5 MG PO TABS: ORAL_TABLET | ORAL | 1 refills | Status: DC

## 2023-06-01 MED ORDER — IVABRADINE HCL 7.5 MG PO TABS: 7.5000 mg | ORAL_TABLET | Freq: Two times a day (BID) | ORAL | 11 refills | Status: DC

## 2023-06-01 NOTE — Progress Notes (Signed)
 Advanced [facility] Failure Clinic Note   PCP: Rollene Almarie LABOR, MD Primary Cardiologist: [name], MD (Inactive)  HF Cardiologist: Dr. 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   Admitted 7/19 with CP Symptoms thought to be related to frequent PVCs. EP consulted and scheduled her for PVC ablation. Echo repeated 11/22/17 and showed improved EF 50-55%. S/p  PVC ablation 12/01/17. Was felt not to be successful.  Was on flecainide  but discontinued due to reduced EF. Placed on amio   Echo 6/20 showed drop in EF back down to 25-30%. Was instructed to f/u in clinic but pt did not. She had outpatient monitor 04/2018 that showed rare PVCs.    Admitted 6/21 with chest pain and shortness of breath.ECHO EF < 20% and normal RV. Had cath with normal cors and preserved cardiac output. Plan was to f/u with EP regarding PVCs. She has remained on AAD therapy w/ amiodarone  + ? blocker therapy.  Admitted to [facility] 4/22 w/ increased dyspnea and volume overload. Also w/ increased PVC burden. Echo showed EF 20-25%, RV normal. She was diuresed w/ IV Lasix  and placed on amiodarone  gtt for PVC suppression. Seen by Dr. Waddell, due to the location of her PVCs, she is not a candidate for re-do ablation.   S/p Barostim 6/22.  Zio 8/22: 13% PVCs (unifocal)  Seen by EP 12/22 and Barostim titrated. Not felt to have any other options for PVCs.   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  -> mexilitene increased to 300 bid  Cardiac PET 6/24 no ischemia/infarction. No active myocardial inflammation/ evidence of sarcoidosis. 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 Having good days and bad days.  Able to do ADLs without too much difficulty. No CP. Struggling with depression. Scared to leave the house. Fluid level is up and down. Takes metolazone  about once a week.    Cardiac Studies   - Cardiac PET (6/24):    FDG uptake was not observed. LV perfusion is normal. There is no evidence of ischemia. There is no evidence of infarction.   Coronary calcium was absent on the attenuation correction CT images.   FDG uptake findings are inconsistent with active myocardial inflammation/sarcoidosis.   No gated images or EF due to interference from AICD leads  - Echo (7/23): EF 25-30%  - Echo (1/23): EF 25-30% RV normal   - Echo (4/22): EF 20-25%, RV normal   - PFTs w/ DLOC (4/22) Mild Restriction Normal Diffusion  - CPX (7/21) FVC 2.32 (70%)      FEV1 1.99 (74%)        FEV1/FVC 86 (105%)        MVV 110 (105%) Resting HR: 94 Peak HR: 175   (100% age predicted max HR) BP rest: 126/82 BP peak: 202/78 Peak VO2: 17.6 (86% predicted peak VO2) - corrects to 28.4 for ibw VE/VCO2 slope:  37 OUES: 1.74 Peak RER: 1.08 Ventilatory Threshold: 14.1 (69% predicted or measured peak VO2) VE/MVV:  53% PETCO2  at peak:   O2pulse:  10   (83% predicted O2pulse)  - RHC (6/21):   RA = 4 RV = 32/6 PA = 31/8 (21) PCW = 12 Fick cardiac output/index = 7.0/3.4 Thermo CO/CI = 5.2/2.5 PVR =1.7 (Thermo) Ao sat = 98% PA sat = 67%, 69%  - Echo (6/21): EF < 20% RV normal   - Echo (6/20): EF 25-30%  - Echo (7/19): EF 50-55%  - CPX (12/18): pVO2 14.0 (corrects to 23.0 for IBW) VeVCO2  32 RER 1.0  - Echo (8/18): EF 25%   - CPX (2/18): FVC 2.57 (79%)      FEV1 2.14 (81%)        FEV1/FVC 83 (101%)        MVV 107 (102%) Resting HR: 106 Peak HR: 166   (93% age predicted max HR) BP rest: 136/98 BP peak: 174/88 Peak VO2: 17.1 (85% predicted peak VO2) - corrects to  28.4 for ibw VE/VCO2 slope:  33 OUES: 2.16 Peak RER: 1.09 Ventilatory Threshold: 14.6 (73% predicted or measured peak VO2) VE/MVV:  59% PETCO2 at peak:  33 O2pulse:  10   (83% predicted O2pulse)  - Echo (12/17) 20-25%  - Cath (6/17): revealed normal vessels, moderate elevation in pulmonary artery pressures, and LVEF 25-30%.  - Echo (6/17): 25-30%  - Echo (6/15): EF 35-40%   Review of systems complete and found to be negative unless listed in HPI.    Past Medical History:  Diagnosis Date   Acute on chronic systolic (congestive) heart failure (HCC) 04/29/2017   Acute pain of right shoulder 06/16/2017   AICD (automatic cardioverter/defibrillator) present    Anginal pain (HCC)    Anxiety    Arthritis    right shoulder    Asthma    CHF (congestive heart failure) (HCC)    Chronic combined systolic and diastolic heart failure (HCC) 03/05/2014   Closed low lateral malleolus fracture 10/23/2013   Cystitis 10/21/2017   Depression    Depression with anxiety 01/20/2013   Diabetes mellitus without complication (HCC)    Diverticulosis    Dyspnea    comes and goes intermittently mostly with exertion    Dysrhythmia    Essential hypertension    Prev followed by [name]    Fibroid    age 31   Gallstones    Generalized abdominal cramping    History of cardiomyopathy    Hypertension    IBS (irritable bowel syndrome)    ICD (implantable cardioverter-defibrillator), single, 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 CPAP intolerance >> smothering Good tolerance of auto device (optimal pressure 12-13 on download).  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     albuterol  (VENTOLIN  HFA) 108 (90 Base) MCG/ACT inhaler Inhale 1-2 puffs into the lungs every 6 (six) hours as needed for wheezing or shortness of breath. 18 g 0   amiodarone  (PACERONE ) 200 MG tablet Take 1 tablet (200 mg total) by mouth daily. 90 tablet 3   budesonide -formoterol  (SYMBICORT ) 160-4.5 MCG/ACT inhaler Inhale 2 puffs into the lungs 2 (two) times daily as needed. 10 g 3   CORLANOR  7.5 MG TABS tablet TAKE 1 TABLET BY MOUTH TWICE DAILY WITH A MEAL 60 tablet 11   dicyclomine  (BENTYL ) 20 MG tablet Take 1 tablet (20 mg total) by mouth daily as needed for spasms. 90 tablet 3   fluticasone  (FLONASE ) 50 MCG/ACT nasal spray Place 2 sprays into both nostrils daily as needed for allergies. 16 g 3   gabapentin  (NEURONTIN ) 300 MG capsule Take 1 capsule (300 mg total) by mouth at bedtime as needed (back spasms). 90 capsule 3   ipratropium-albuterol  (DUONEB) 0.5-2.5 (3) MG/3ML SOLN Take 3 mLs by nebulization every 6 (six) hours as needed. 360 mL 1   JARDIANCE  25 MG TABS tablet TAKE 1 TABLET BY MOUTH ONCE DAILY BEFORE BREAKFAST 90 tablet 0   Magnesium  Oxide 200 MG TABS Take 1 tablet (200 mg total) by mouth daily. 90 tablet 3   metolazone  (ZAROXOLYN ) 2.5 MG tablet TAKE 1 TABLET BY MOUTH AS DIRECTED 5 tablet 1   metoprolol  succinate (TOPROL -XL) 100 MG 24 hr tablet Take 1  tablet (100 mg total) by mouth daily. Take with or immediately following a meal. 90 tablet 3   mexiletine (MEXITIL ) 150 MG capsule Take 2 capsules (300 mg total) by mouth 2 (two) times daily. 120 capsule 11   Multiple Vitamins-Minerals (MULTIVITAMIN GUMMIES WOMENS PO) Take 2 tablets by mouth daily.     naproxen  sodium (ALEVE ) 220 MG tablet Take 220-440 mg by mouth 2 (two) times daily as needed (Pain).     nitroGLYCERIN  (NITROSTAT ) 0.4 MG SL tablet Place 1 tablet (0.4 mg total) under the tongue every 5 (five) minutes as needed for chest pain. 75 tablet 0   potassium chloride  SA (KLOR-CON  M) 20 MEQ tablet Take 20 mEq by mouth daily.     sacubitril -valsartan  (ENTRESTO ) 97-103 MG Take 1 tablet by mouth twice daily 180 tablet 3   spironolactone  (ALDACTONE ) 25 MG tablet Take 1 tablet (25 mg total) by mouth at bedtime. 90 tablet 3   tiZANidine  (ZANAFLEX ) 2 MG tablet Take 1 tablet (2 mg total) by mouth at bedtime. 30 tablet 0   torsemide  (DEMADEX ) 20 MG tablet Take 2 tablets (40 mg total) by mouth 2 (two) times daily. 360 tablet 2   Ubrogepant  (UBRELVY ) 100 MG TABS Take 1 tablet (100 mg total) by mouth daily as needed (migraine). 30 tablet 3   Vitamin D , Ergocalciferol , (DRISDOL ) 1.25 MG (50000 UNIT) CAPS capsule Take 1 capsule by mouth once a week 12 capsule 0   No current facility-administered medications for this encounter.   Allergies  Allergen Reactions   Vancomycin  Other (See Comments)    did something to my kidneys, PROGRESSED TO KIDNEY FAILURE!!   Aspirin  Other (See Comments)    Wheezing, (Pt states that she just wheezes some when she takes aspirin  by itself but she can take aspirin  in a combination  product).   Contrast Media [Iodinated Contrast Media] Other (See Comments)    Multiple CT contrast studies done over 2 weeks caused ARF   Ciprofloxacin Itching and Rash   Cyclobenzaprine  Other (See Comments)    Can not tolerate   Farxiga  [Dapagliflozin] Rash   Sulfa Antibiotics Itching and  Rash   Social History   Socioeconomic History   Marital status: Married    Spouse name: [name]   Number of children: 1   Years of education: Not on file   Highest education level: Associate degree: academic program  Occupational History   Occupation: stay at home mom  Tobacco Use   Smoking status: Never   Smokeless tobacco: Never  Vaping Use   Vaping status: Never Used  Substance and Sexual Activity   Alcohol use: No   Drug use: No   Sexual activity: Yes    Birth control/protection: Surgical, Pill    Comment: BTL  Other Topics Concern   Not on file  Social History Narrative   Lives with husband, daughter niece and great nephew   Social Drivers of Corporate Investment Banker Strain: Low Risk  (05/31/2023)   Overall Financial Resource Strain (CARDIA)    Difficulty of Paying Living Expenses: Not very hard  Food Insecurity: No Food Insecurity (05/31/2023)   Hunger Vital Sign    Worried About Running Out of Food in the Last Year: Never true    Ran Out of Food in the Last Year: Never true  Transportation Needs: Unmet Transportation Needs (05/31/2023)   PRAPARE - Administrator, Civil Service (Medical): Yes    Lack of Transportation (Non-Medical): No  Physical Activity: Insufficiently Active (05/31/2023)   Exercise Vital Sign    Days of Exercise per Week: 1 day    Minutes of Exercise per Session: 10 min  Stress: Stress Concern Present (05/31/2023)   [employer] of Occupational Health - Occupational Stress Questionnaire    Feeling of Stress : Very much  Social Connections: Socially Isolated (05/31/2023)   Social Connection and Isolation Panel [NHANES]    Frequency of Communication with Friends and Family: Never    Frequency of Social Gatherings with Friends and Family: Never    Attends Religious Services: Never    Database Administrator or Organizations: No    Attends Banker Meetings: Never    Marital Status: Married  Catering Manager Violence: Not At  Risk (05/06/2023)   Humiliation, Afraid, Rape, and Kick questionnaire    Fear of Current or Ex-Partner: No    Emotionally Abused: No    Physically Abused: No    Sexually Abused: No   Family History  Problem Relation Age of Onset   Emphysema Maternal Grandmother        smoked   Heart disease Maternal Grandmother 49       MI   Rheum arthritis Mother    Allergies Daughter    Colon cancer Neg Hx    Pulse 87   Wt 97.3 kg (214 lb 9.6 oz)   SpO2 98%   BMI 35.71 kg/m   Wt Readings from Last 3 Encounters:  06/01/23 97.3 kg (214 lb 9.6 oz)  05/06/23 93.9 kg (207 lb)  03/09/23 99.8 kg (220 lb)   PHYSICAL EXAM: General:  Well appearing. No resp difficulty HEENT: normal Neck: supple. no JVD. Carotids 2+ bilat; no bruits. No lymphadenopathy or thryomegaly appreciated. Cor: PMI nondisplaced. Regular rate & rhythm. No rubs, gallops or murmurs. Lungs:  clear Abdomen: obese soft, nontender, nondistended. No hepatosplenomegaly. No bruits or masses. Good bowel sounds. Extremities: no cyanosis, clubbing, rash, edema Neuro: alert & orientedx3, cranial nerves grossly intact. moves all 4 extremities w/o difficulty. Affect pleasant   Device interrogation: HL score 32 -> 1  No VT/AF Volume ok. Activity level stabl .  ASSESSMENT & PLAN:  1. Chronic systolic HF:   - due to peri-partum CM, onset 1999.  - S/P [employer] ICD 07/2016 - s/p Barostim 6/22 - LHC 6/17 No CAD - Echo 12/17 EF 20-25% - Echo 8/18 EF 25-30% s/p ICD 3/18.  - CPX 12/18 showed moderate limitation due mostly to obesity with some HF component - Echo 12/19 EF 20-25%. RV ok  - Echo 6/21 EF < 20%. - Echo 7/23 EF 25-30% - Recent Zio results worrisome for LMNA CM. 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 Suspect progressive course. Has been referred to Dr. Joseph>>Variant is interpreted as variant of unknown clinical significance. Per Dr. Thera, as there are no affected family members to  determine if this variant segregates with disease, genetic testing her daughter for this familial TTN variant is unwarranted  - Cardiac PET 6/24: no ischemia/infarction. No active myocardial inflammation/ evidence of sarcoidosis - Remains NYHA III.  Volume labile but improved today on exam and by [facility].  - Continue torsemide  20 mg bid. Continue PRN metolazone . Following with ICM clinic - Continue Jardiance  25 mg daily. - Continue Entesto 97-103 mg bid. - Continue spiro 25 mg qhs. - Continue Toprol  to 100 daily  - Continue Corlanor  7.5 mg bid. - Unclear if PVCs are a result of her CM or contributing to it (or both). Failed previous PVC ablation. C/w high burden despite 2 AAD therapy regimen w/ amiodarone  and mexiletine  - Suspect she may need advanced therapies in future   2. Frequent PVCs/ Bigeminy - Holter Monitor 6/19 with 30% PVCs with one primary morphology.  - S/p PVC ablation 11/2017 with Dr Waddell unsuccessful - Off flecainide  with EF down and persistent PVCs.  - Zio Patch 04/2019 > 17% PVCs and >8% couplets.  - Zio Patch 11/28/2019 8.5 % PVCs - Zio 9/22 13% PVCs (unifocal)  - Per Dr. Waddell, due to the location of her PVCs, she is not a candidate for re-do ablation. Could consider referral to tertiary center - Zio 14 day (12/23) showed frequent NSVT, 13% PVC burden (see discussion above) - Cardiac PET 6/24 negative for sarcoid  - Continue amiodarone  200 mg daily. - Continue mexiletine 300 mg bid  - Amio labs reviewed  3. Morbid obesity - s/p gastric Sleeve at [facility] 09/20/17  - Body mass index is 35.71 kg/m. - Will refer to [facility]  - She will discuss GLP1RA with Dr. Rollene   4. Daytime Fatigue - Sleep study in 2019 and again in 2021 were both negative for sleep apnea   5. DM 2 - On Jardiance  - Per PCP - No change  [name], MD  4:14 PM

## 2023-06-01 NOTE — Patient Instructions (Signed)
 Medication Changes:  No Changes In Medications at this time.   Lab Work:  Labs done today, your results will be available in MyChart, we will contact you for abnormal readings.  Referrals:  YOU HAVE BEEN REFERRED TO [facility] THEY WILL REACH OUT TO YOU OR CALL TO ARRANGE THIS. PLEASE CALL US  WITH ANY CONCERNS   Follow-Up in: 2 MONS WITH APP AS SCHEDULED   At the [facility], you and your health needs are our priority. We have a designated team specialized in the treatment of Heart Failure. This Care Team includes your primary Heart Failure Specialized Cardiologist (physician), Advanced Practice Providers (APPs- Physician Assistants and Nurse Practitioners), and Pharmacist who all work together to provide you with the care you need, when you need it.   You may see any of the following providers on your designated Care Team at your next follow up:  Dr. [name] Dr. Ezra Shuck Dr. Ria Commander Dr. Odis Brownie Greig Mosses, NP Caffie Shed, [place] [facility] [place], [place] Beckey Coe, NP Jordan Lee, NP Tinnie Redman, PharmD   Please be sure to bring in all your medications bottles to every appointment.   Need to Contact Us :  If you have any questions or concerns before your next appointment please send us  a message through [facility] [place] or call our office at [phone].    TO LEAVE A MESSAGE FOR THE NURSE SELECT OPTION 2, PLEASE LEAVE A MESSAGE INCLUDING: YOUR NAME DATE OF BIRTH CALL BACK NUMBER REASON FOR CALL**this is important as we prioritize the call backs  YOU WILL RECEIVE A CALL BACK THE SAME DAY AS LONG AS YOU CALL BEFORE 4:00 PM

## 2023-06-04 ENCOUNTER — Ambulatory Visit: Payer: PPO | Admitting: Internal Medicine

## 2023-06-07 ENCOUNTER — Ambulatory Visit: Payer: PPO | Attending: Internal Medicine

## 2023-06-07 DIAGNOSIS — I5042 Chronic combined systolic (congestive) and diastolic (congestive) heart failure: Secondary | ICD-10-CM | POA: Diagnosis not present

## 2023-06-07 DIAGNOSIS — Z95810 Presence of automatic (implantable) cardiac defibrillator: Secondary | ICD-10-CM

## 2023-06-08 ENCOUNTER — Encounter: Payer: Self-pay | Admitting: Hematology and Oncology

## 2023-06-08 ENCOUNTER — Telehealth (HOSPITAL_COMMUNITY): Payer: Self-pay | Admitting: Pharmacy Technician

## 2023-06-08 ENCOUNTER — Other Ambulatory Visit (HOSPITAL_COMMUNITY): Payer: Self-pay

## 2023-06-08 NOTE — Progress Notes (Signed)
 EPIC Encounter for ICM Monitoring  Patient Name: [name] is a 48 y.o. female Date: 06/08/2023 Primary Care Physican: Rollene Almarie LABOR, MD Primary Cardiologist: Bensimhon Electrophysiologist: Fernande 11/19/2021 Weight: 214 lbs      04/08/2022 Weight: 190-194 lbs      05/13/2022 Weight: 193 lbs     09/15/2022 Weight: 206 lbs   (baseline closer to 198 lbs)      11/27/2022 Weight: 205 lbs          12/28/2022 Weight: 218 lbs       01/27/2023 Weight: 200 lbs     02/03/2023 Weight: 200 lbs 04/21/2023 Weight: 207 lbs (baseline 200-203)   Attempted call to patient and unable to reach.  Left detailed message per DPR regarding transmission.  Transmission results reviewed.    Heartlogic HF Index 2 suggesting fluid levels have returned to normal.     Prescribed: Torsemide  20 mg Take 2 tablets (40 mg total) by mouth twice a day Potassium 20 mEq take 1 tablet(s) (20 mEq total) by mouth daily Metolazone  2.5 mg take 1 tablet (2.5 mg total) by mouth as directed Spironolactone  25 mg take 1 tablet (25 mg total) at bedtime.    Labs: 06/01/2023 Creatinine 0.86, BUN 7,   Potassium 3.2, Sodium 138, GFR >60 05/12/2023 Creatinine 1.01, BUN 13, Potassium 3.4, Sodium 141, GFR >60 01/21/2023 Creatinine 0.90, BUN 7,   Potassium 4.2, Sodium 141, BNP 1,167.9  10/28/2022 Creatinine 0.83, BUN 9,   Potassium 3.1, Sodium 136, GFR >60 10/23/2022 Creatinine 0.98, BUN 5,   Potassium 3.4, Sodium 137, GFR >60 07/30/2022 Creatinine 0.85, BUN 6,   Potassium 3.6, Sodium 136, GFR >60 07/03/2022 Creatinine 0.93, BUN 5,   Potassium 3.6, Sodium 136, GFR 76 A complete set of results can be found in Results Review.   Recommendations:  Left voice mail with ICM number and encouraged to call if experiencing any fluid symptoms.    Follow-up plan: ICM clinic phone appointment on 07/12/2023.  91 day device clinic remote transmission 06/23/2023.              EP/Cardiology next office visit:  Recall 05/27/2023 with Jodie Passey, PA.    07/30/2023 with HF Clinic.         Copy of ICM check sent to Dr. Fernande.  3 Month HeartLogicT Heart Failure Index:    8 Day Data Trend:          [name], RN 06/08/2023 10:37 AM

## 2023-06-08 NOTE — Telephone Encounter (Signed)
 Patient Advocate Encounter   Received notification from [employer] that prior authorization for Ivabradine is required.   PA submitted on CoverMyMeds Key [id number] Status is pending   Will continue to follow.

## 2023-06-09 ENCOUNTER — Telehealth: Payer: Self-pay

## 2023-06-09 NOTE — Telephone Encounter (Signed)
 Advanced Heart Failure Patient Advocate Encounter  Prior Authorization for Ivabradine  has been approved.    PA# [id number] Effective dates: 06/08/23 through 06/07/24  Correne Dillon, CPhT

## 2023-06-09 NOTE — Telephone Encounter (Signed)
 Remote ICM transmission received.  Attempted call to patient regarding ICM remote transmission and left detailed message per DPR.  Left ICM phone number and advised to return call for any fluid symptoms or questions. Next ICM remote transmission scheduled 07/12/2023.

## 2023-06-11 ENCOUNTER — Encounter: Payer: Self-pay | Admitting: Internal Medicine

## 2023-06-11 ENCOUNTER — Telehealth: Payer: Self-pay | Admitting: *Deleted

## 2023-06-11 ENCOUNTER — Ambulatory Visit (INDEPENDENT_AMBULATORY_CARE_PROVIDER_SITE_OTHER): Payer: PPO | Admitting: Internal Medicine

## 2023-06-11 DIAGNOSIS — R7301 Impaired fasting glucose: Secondary | ICD-10-CM | POA: Insufficient documentation

## 2023-06-11 DIAGNOSIS — E559 Vitamin D deficiency, unspecified: Secondary | ICD-10-CM | POA: Diagnosis not present

## 2023-06-11 DIAGNOSIS — Z6836 Body mass index (BMI) 36.0-36.9, adult: Secondary | ICD-10-CM | POA: Diagnosis not present

## 2023-06-11 LAB — LIPID PANEL
Cholesterol: 164 mg/dL (ref 0–200)
HDL: 67 mg/dL (ref >39.00)
LDL Cholesterol: 83 mg/dL (ref 0–99)
NonHDL: 96.65
Total CHOL/HDL Ratio: 2
Triglycerides: 66 mg/dL (ref 0.0–149.0)
VLDL: 13.2 mg/dL (ref 0.0–40.0)

## 2023-06-11 LAB — VITAMIN D 25 HYDROXY (VIT D DEFICIENCY, FRACTURES): VITD: 14.88 ng/mL — ABNORMAL LOW (ref 30.00–100.00)

## 2023-06-11 LAB — HEMOGLOBIN A1C: Hgb A1c MFr Bld: 5.5 % (ref 4.6–6.5)

## 2023-06-11 MED ORDER — SEMAGLUTIDE-WEIGHT MANAGEMENT 0.5 MG/0.5ML ~~LOC~~ SOAJ: 0.5000 mg | SUBCUTANEOUS | 0 refills | Status: DC

## 2023-06-11 MED ORDER — SEMAGLUTIDE-WEIGHT MANAGEMENT 1.7 MG/0.75ML ~~LOC~~ SOAJ: 1.7000 mg | SUBCUTANEOUS | 0 refills | Status: DC

## 2023-06-11 MED ORDER — SEMAGLUTIDE-WEIGHT MANAGEMENT 1 MG/0.5ML ~~LOC~~ SOAJ: 1.0000 mg | SUBCUTANEOUS | 0 refills | Status: DC

## 2023-06-11 MED ORDER — SEMAGLUTIDE-WEIGHT MANAGEMENT 2.4 MG/0.75ML ~~LOC~~ SOAJ: 2.4000 mg | SUBCUTANEOUS | 0 refills | Status: DC

## 2023-06-11 MED ORDER — SEMAGLUTIDE-WEIGHT MANAGEMENT 0.25 MG/0.5ML ~~LOC~~ SOAJ: 0.2500 mg | SUBCUTANEOUS | 0 refills | Status: DC

## 2023-06-11 NOTE — Progress Notes (Signed)
   Subjective:   Patient ID: [name], female    DOB: [birth year], 48 y.o.   MRN: [id number]  HPI The patient is a 48 YO female coming in for weight management and follow up labs.  Review of Systems  Constitutional:  Positive for unexpected weight change.  HENT: Negative.    Eyes: Negative.   Respiratory:  Negative for cough, chest tightness and shortness of breath.   Cardiovascular:  Negative for chest pain, palpitations and leg swelling.  Gastrointestinal:  Negative for abdominal distention, abdominal pain, constipation, diarrhea, nausea and vomiting.  Musculoskeletal: Negative.   Skin: Negative.   Neurological: Negative.   Psychiatric/Behavioral:  Positive for decreased concentration and dysphoric mood. The patient is nervous/anxious.     Objective:  Physical Exam Constitutional:      Appearance: She is well-developed.  HENT:     Head: Normocephalic and atraumatic.  Cardiovascular:     Rate and Rhythm: Regular rhythm. Tachycardia present.  Pulmonary:     Effort: Pulmonary effort is normal. No respiratory distress.     Breath sounds: Normal breath sounds. No wheezing or rales.  Abdominal:     General: Bowel sounds are normal. There is no distension.     Palpations: Abdomen is soft.     Tenderness: There is no abdominal tenderness. There is no rebound.  Musculoskeletal:     Cervical back: Normal range of motion.  Skin:    General: Skin is warm and dry.  Neurological:     Mental Status: She is alert and oriented to person, place, and time.     Coordination: Coordination normal.     Vitals:   06/11/23 1042  BP: 120/80  Pulse: (!) 116  Temp: 97.7 F (36.5 C)  TempSrc: Oral  SpO2: 97%  Weight: 221 lb (100.2 kg)  Height: 5\' 5"  (1.651 m)    Assessment & Plan:

## 2023-06-11 NOTE — Telephone Encounter (Signed)
Received a prior auth for [facility] but patient has [employer]/ Medicare.  Medicare does not cover weight loss medications

## 2023-06-11 NOTE — Assessment & Plan Note (Addendum)
Checking HGA1c and adjust as needed.

## 2023-06-11 NOTE — Assessment & Plan Note (Signed)
BMI 36 but complicated by MDD and hypertension. She is s/p sleeve procedure and needs additional assistance. Given her severe CV disease with systolic heart failure she would benefit from glp-1 which is prescribed wegovy standard titration. Discussed risks/benefits.

## 2023-06-11 NOTE — Patient Instructions (Signed)
We will check the labs today and have sent in the wegovy.

## 2023-06-11 NOTE — Telephone Encounter (Signed)
She has CV indication for this and obesity. I thought they now covered this. Please do PA

## 2023-06-11 NOTE — Assessment & Plan Note (Signed)
Checking vitamin d as she remains on 50000 international units weekly and needs assessment for adequacy/toxicity.

## 2023-06-14 ENCOUNTER — Telehealth (HOSPITAL_COMMUNITY): Payer: Self-pay

## 2023-06-14 ENCOUNTER — Encounter: Payer: Self-pay | Admitting: Internal Medicine

## 2023-06-14 DIAGNOSIS — I5042 Chronic combined systolic (congestive) and diastolic (congestive) heart failure: Secondary | ICD-10-CM

## 2023-06-14 NOTE — Telephone Encounter (Signed)
Spoke with patient regarding the following results. Patient made aware and patient verbalized understanding.   PATIENT SCHEDULED FOR REPEAT LABS THIS FRIDAY 1/24

## 2023-06-14 NOTE — Telephone Encounter (Signed)
-----   Message from Arvilla Meres sent at 06/13/2023 11:28 AM EST ----- Please repeat BMET to recheck potassium

## 2023-06-14 NOTE — Addendum Note (Signed)
Addended by: Bea Laura B on: 06/14/2023 10:40 AM   Modules accepted: Orders

## 2023-06-15 ENCOUNTER — Other Ambulatory Visit (HOSPITAL_COMMUNITY): Payer: Self-pay

## 2023-06-15 ENCOUNTER — Telehealth: Payer: Self-pay

## 2023-06-15 ENCOUNTER — Encounter (HOSPITAL_COMMUNITY): Payer: Self-pay | Admitting: Internal Medicine

## 2023-06-15 ENCOUNTER — Encounter: Payer: Self-pay | Admitting: Internal Medicine

## 2023-06-15 MED ORDER — SPIRONOLACTONE 25 MG PO TABS: 25.0000 mg | ORAL_TABLET | Freq: Every day | ORAL | 3 refills | Status: DC

## 2023-06-15 NOTE — Telephone Encounter (Signed)
Pharmacy Patient Advocate Encounter   Received notification from Pt Calls Messages that prior authorization for Wegovy 0.25MG /0.5ML auto-injectors is required/requested.   Insurance verification completed.   The patient is insured through [facility] ADVANTAGE/RX ADVANCE .   Per test claim: PA required; PA submitted to above mentioned insurance via CoverMyMeds Key/confirmation #/EOC [id number] Status is pending

## 2023-06-16 NOTE — Telephone Encounter (Signed)
Pharmacy Patient Advocate Encounter  Received notification from [facility] [employer] that Prior Authorization for [facility] has been DENIED.  Full denial letter will be uploaded to the media tab. See denial reason below.

## 2023-06-17 MED ORDER — ZEPBOUND 2.5 MG/0.5ML ~~LOC~~ SOAJ: 2.5000 mg | SUBCUTANEOUS | 0 refills | Status: DC

## 2023-06-18 ENCOUNTER — Other Ambulatory Visit (HOSPITAL_COMMUNITY): Payer: PPO

## 2023-06-21 ENCOUNTER — Ambulatory Visit (HOSPITAL_COMMUNITY)
Admission: RE | Admit: 2023-06-21 | Discharge: 2023-06-21 | Disposition: A | Discharge status: Home / Self Care | Payer: PPO | Source: Ambulatory Visit | Attending: Cardiology | Admitting: Cardiology

## 2023-06-21 DIAGNOSIS — I5042 Chronic combined systolic (congestive) and diastolic (congestive) heart failure: Secondary | ICD-10-CM | POA: Insufficient documentation

## 2023-06-21 DIAGNOSIS — Z95810 Presence of automatic (implantable) cardiac defibrillator: Secondary | ICD-10-CM | POA: Diagnosis not present

## 2023-06-21 LAB — BASIC METABOLIC PANEL
Anion gap: 8 (ref 5–15)
BUN: 6 mg/dL (ref 6–20)
CO2: 26 mmol/L (ref 22–32)
Calcium: 9.3 mg/dL (ref 8.9–10.3)
Chloride: 103 mmol/L (ref 98–111)
Creatinine, Ser: 0.97 mg/dL (ref 0.44–1.00)
GFR, Estimated: >60 mL/min (ref >60)
Glucose, Bld: 97 mg/dL (ref 70–99)
Potassium: 4.6 mmol/L (ref 3.5–5.1)
Sodium: 137 mmol/L (ref 135–145)

## 2023-06-23 ENCOUNTER — Ambulatory Visit (INDEPENDENT_AMBULATORY_CARE_PROVIDER_SITE_OTHER): Payer: PPO

## 2023-06-23 ENCOUNTER — Other Ambulatory Visit (HOSPITAL_COMMUNITY): Payer: Self-pay

## 2023-06-23 DIAGNOSIS — I428 Other cardiomyopathies: Secondary | ICD-10-CM | POA: Diagnosis not present

## 2023-06-24 LAB — CUP PACEART REMOTE DEVICE CHECK
Battery Remaining Longevity: 102 mo
Battery Remaining Percentage: 71 %
Brady Statistic RV Percent Paced: 0 %
Date Time Interrogation Session: 20250129093800
HighPow Impedance: 89 Ohm
Implantable Lead Connection Status: 753985
Implantable Lead Implant Date: 20180312
Implantable Lead Location: 753860
Implantable Lead Model: 293
Implantable Lead Serial Number: 422842
Implantable Pulse Generator Implant Date: 20180312
Lead Channel Impedance Value: 595 Ohm
Lead Channel Setting Pacing Amplitude: 2.5 V
Lead Channel Setting Pacing Pulse Width: 0.4 ms
Lead Channel Setting Sensing Sensitivity: 0.5 mV
Pulse Gen Serial Number: 226601
Zone Setting Status: 755011

## 2023-06-28 ENCOUNTER — Encounter: Payer: Self-pay | Admitting: Internal Medicine

## 2023-06-30 ENCOUNTER — Ambulatory Visit: Payer: PPO | Admitting: Family Medicine

## 2023-07-07 ENCOUNTER — Ambulatory Visit: Payer: PPO | Admitting: Internal Medicine

## 2023-07-07 ENCOUNTER — Encounter: Payer: Self-pay | Admitting: Internal Medicine

## 2023-07-07 ENCOUNTER — Other Ambulatory Visit: Payer: Self-pay | Admitting: Internal Medicine

## 2023-07-07 MED ORDER — IPRATROPIUM-ALBUTEROL 0.5-2.5 (3) MG/3ML IN SOLN: 3.0000 mL | Freq: Four times a day (QID) | RESPIRATORY_TRACT | 1 refills | Status: DC | PRN

## 2023-07-12 ENCOUNTER — Telehealth: Payer: Self-pay

## 2023-07-12 ENCOUNTER — Ambulatory Visit: Payer: PPO | Attending: Internal Medicine

## 2023-07-12 DIAGNOSIS — I5042 Chronic combined systolic (congestive) and diastolic (congestive) heart failure: Secondary | ICD-10-CM | POA: Diagnosis not present

## 2023-07-12 DIAGNOSIS — Z95810 Presence of automatic (implantable) cardiac defibrillator: Secondary | ICD-10-CM | POA: Diagnosis not present

## 2023-07-12 NOTE — Telephone Encounter (Signed)
 Remote ICM transmission received.  Attempted call to patient regarding ICM remote transmission and left detailed message per DPR.  Left ICM phone number and advised to return call for any fluid symptoms or questions. Next ICM remote transmission scheduled 07/19/2023.

## 2023-07-12 NOTE — Progress Notes (Signed)
EPIC Encounter for ICM Monitoring  Patient Name: [name] is a 48 y.o. female Date: 07/12/2023 Primary Care Physican: Myrlene Broker, MD Primary Cardiologist: Bensimhon Electrophysiologist: Graciela Husbands 12/28/2022 Weight: 218 lbs       01/27/2023 Weight: 200 lbs     02/03/2023 Weight: 200 lbs 04/21/2023 Weight: 207 lbs (baseline 200-203) 06/01/2023 Office Weight: 214 lbs   Attempted call to patient and unable to reach.  Left detailed message per DPR regarding transmission.  Transmission results reviewed.    Heartlogic HF Index 41 suggesting possible fluid accumulation starting 1/27.  Thoracic impedance trending down correlating with possible fluid accumulation.    Prescribed: Torsemide 20 mg Take 2 tablets (40 mg total) by mouth twice a day Potassium 20 mEq take 1 tablet(s) (20 mEq total) by mouth daily Metolazone 2.5 mg take 1 tablet (2.5 mg total) by mouth as directed Spironolactone 25 mg take 1 tablet (25 mg total) at bedtime.    Labs: 06/01/2023 Creatinine 0.86, BUN 7,   Potassium 3.2, Sodium 138, GFR >60 05/12/2023 Creatinine 1.01, BUN 13, Potassium 3.4, Sodium 141, GFR >60 01/21/2023 Creatinine 0.90, BUN 7,   Potassium 4.2, Sodium 141, BNP 1,167.9  10/28/2022 Creatinine 0.83, BUN 9,   Potassium 3.1, Sodium 136, GFR >60 10/23/2022 Creatinine 0.98, BUN 5,   Potassium 3.4, Sodium 137, GFR >60 07/30/2022 Creatinine 0.85, BUN 6,   Potassium 3.6, Sodium 136, GFR >60 07/03/2022 Creatinine 0.93, BUN 5,   Potassium 3.6, Sodium 136, GFR 76 A complete set of results can be found in Results Review.   Recommendations:  Left voice mail with ICM number and encouraged to call if experiencing any fluid symptoms.  Will send to HF clinic if patient is reached.      Follow-up plan: ICM clinic phone appointment on 07/19/2023 to recheck fluid levels.  91 day device clinic remote transmission 09/22/2023.              EP/Cardiology next office visit:  Recall 05/27/2023 with Otilio Saber, PA.   07/30/2023  with HF Clinic.         Copy of ICM check sent to Dr. Graciela Husbands.  3 Month HeartLogicT Heart Failure Index:    8 Day Data Trend:          Karie Soda, RN 07/12/2023 2:22 PM

## 2023-07-13 NOTE — Progress Notes (Deleted)
 [name] [address] [place] [address] Phone: [phone] Subjective:    I'm seeing this patient by the request  of:  Myrlene Broker, MD  CC:   [id number]  [name] is a 48 y.o. female coming in with complaint of back and neck pain. OMT on 03/05/2024. Patient states   Medications patient has been prescribed:   Taking:         Reviewed prior external information including notes and imaging from previsou exam, outside providers and external EMR if available.   As well as notes that were available from care everywhere and other healthcare systems.  Past medical history, social, surgical and family history all reviewed in electronic medical record.  No pertanent information unless stated regarding to the chief complaint.   Past Medical History:  Diagnosis Date   Acute on chronic systolic (congestive) heart failure (HCC) 04/29/2017   Acute pain of right shoulder 06/16/2017   AICD (automatic cardioverter/defibrillator) present    Anginal pain (HCC)    Anxiety    Arthritis    right shoulder    Asthma    CHF (congestive heart failure) (HCC)    Chronic combined systolic and diastolic heart failure (HCC) 03/05/2014   Closed low lateral malleolus fracture 10/23/2013   Cystitis 10/21/2017   Depression    Depression with anxiety 01/20/2013   Diabetes mellitus without complication (HCC)    Diverticulosis    Dyspnea    comes and goes intermittently mostly with exertion    Dysrhythmia    Essential hypertension    Prev followed by [name]    Fibroid    age 12   Gallstones    Generalized abdominal cramping    History of cardiomyopathy    Hypertension    IBS (irritable bowel syndrome)    ICD (implantable cardioverter-defibrillator), single, 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 CPAP intolerance >> "smothering" Good tolerance of auto device (optimal pressure 12-13 on download).  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   Aspirin Other (See Comments)    Wheezing, (Pt states that she just wheezes some when she takes aspirin by itself but she can take aspirin in a combination product).   Contrast Media [Iodinated Contrast Media] Other (See Comments)    Multiple CT contrast studies done over 2 weeks caused ARF   Ciprofloxacin Itching and Rash   Cyclobenzaprine Other (See Comments)    Can not tolerate   Marcelline Deist [Dapagliflozin] Rash  Sulfa Antibiotics Itching and Rash     Review of Systems:  No headache, visual changes, nausea, vomiting, diarrhea, constipation, dizziness, abdominal pain, skin rash, fevers, chills, night sweats, weight loss, swollen lymph nodes, body aches, joint swelling, chest pain, shortness of breath, mood changes. POSITIVE muscle aches  Objective  There were no vitals taken for this visit.   General: No apparent distress alert and oriented x3 mood and affect normal, dressed appropriately.  HEENT: Pupils equal, extraocular movements intact  Respiratory: [name] speak  in full sentences and does not appear short of breath  Cardiovascular: No lower extremity edema, non tender, no erythema  Gait MSK:  Back   Osteopathic findings  C2 flexed rotated and side bent right C6 flexed rotated and side bent left T3 extended rotated and side bent right inhaled rib T9 extended rotated and side bent left L2 flexed rotated and side bent right Sacrum right on right       Assessment and Plan:  No problem-specific Assessment & Plan notes found for this encounter.    Nonallopathic problems  Decision today to treat with OMT was based on Physical Exam  After verbal consent patient was treated with HVLA, ME, FPR techniques in cervical, rib, thoracic, lumbar, and sacral  areas  Patient tolerated the procedure well with improvement in symptoms  Patient given exercises, stretches and lifestyle modifications  See medications in patient instructions if given  Patient will follow up in 4-8 weeks             Note: This dictation was prepared with Dragon dictation along with smaller phrase technology. Any transcriptional errors that result from this process are unintentional.

## 2023-07-16 ENCOUNTER — Ambulatory Visit: Payer: PPO | Admitting: Family Medicine

## 2023-07-16 ENCOUNTER — Encounter (HOSPITAL_COMMUNITY): Payer: Self-pay

## 2023-07-16 ENCOUNTER — Ambulatory Visit (HOSPITAL_COMMUNITY)
Admission: RE | Admit: 2023-07-16 | Discharge: 2023-07-16 | Disposition: A | Discharge status: Home / Self Care | Payer: PPO | Source: Ambulatory Visit | Attending: Family Medicine | Admitting: Family Medicine

## 2023-07-16 VITALS — BP 112/80 | HR 83 | Wt 213.8 lb

## 2023-07-16 DIAGNOSIS — E119 Type 2 diabetes mellitus without complications: Secondary | ICD-10-CM | POA: Insufficient documentation

## 2023-07-16 DIAGNOSIS — F419 Anxiety disorder, unspecified: Secondary | ICD-10-CM | POA: Diagnosis not present

## 2023-07-16 DIAGNOSIS — Z79899 Other long term (current) drug therapy: Secondary | ICD-10-CM | POA: Diagnosis not present

## 2023-07-16 DIAGNOSIS — F32A Depression, unspecified: Secondary | ICD-10-CM | POA: Insufficient documentation

## 2023-07-16 DIAGNOSIS — I110 Hypertensive heart disease with heart failure: Secondary | ICD-10-CM | POA: Insufficient documentation

## 2023-07-16 DIAGNOSIS — Z86718 Personal history of other venous thrombosis and embolism: Secondary | ICD-10-CM | POA: Insufficient documentation

## 2023-07-16 DIAGNOSIS — Z95810 Presence of automatic (implantable) cardiac defibrillator: Secondary | ICD-10-CM | POA: Insufficient documentation

## 2023-07-16 DIAGNOSIS — Z6835 Body mass index (BMI) 35.0-35.9, adult: Secondary | ICD-10-CM | POA: Insufficient documentation

## 2023-07-16 DIAGNOSIS — O903 Peripartum cardiomyopathy: Secondary | ICD-10-CM | POA: Diagnosis not present

## 2023-07-16 DIAGNOSIS — R5383 Other fatigue: Secondary | ICD-10-CM | POA: Diagnosis not present

## 2023-07-16 DIAGNOSIS — I5022 Chronic systolic (congestive) heart failure: Secondary | ICD-10-CM | POA: Insufficient documentation

## 2023-07-16 DIAGNOSIS — I5042 Chronic combined systolic (congestive) and diastolic (congestive) heart failure: Secondary | ICD-10-CM | POA: Diagnosis present

## 2023-07-16 DIAGNOSIS — Z9884 Bariatric surgery status: Secondary | ICD-10-CM | POA: Insufficient documentation

## 2023-07-16 DIAGNOSIS — I493 Ventricular premature depolarization: Secondary | ICD-10-CM | POA: Insufficient documentation

## 2023-07-16 DIAGNOSIS — Z7984 Long term (current) use of oral hypoglycemic drugs: Secondary | ICD-10-CM | POA: Insufficient documentation

## 2023-07-16 LAB — BASIC METABOLIC PANEL
Anion gap: 7 (ref 5–15)
BUN: 7 mg/dL (ref 6–20)
CO2: 28 mmol/L (ref 22–32)
Calcium: 9.2 mg/dL (ref 8.9–10.3)
Chloride: 102 mmol/L (ref 98–111)
Creatinine, Ser: 0.84 mg/dL (ref 0.44–1.00)
GFR, Estimated: >60 mL/min (ref >60)
Glucose, Bld: 96 mg/dL (ref 70–99)
Potassium: 3.3 mmol/L — ABNORMAL LOW (ref 3.5–5.1)
Sodium: 137 mmol/L (ref 135–145)

## 2023-07-16 LAB — CBC
HCT: 42.1 % (ref 36.0–46.0)
Hemoglobin: 13.4 g/dL (ref 12.0–15.0)
MCH: 27.6 pg (ref 26.0–34.0)
MCHC: 31.8 g/dL (ref 30.0–36.0)
MCV: 86.8 fL (ref 80.0–100.0)
Platelets: 279 10*3/uL (ref 150–400)
RBC: 4.85 MIL/uL (ref 3.87–5.11)
RDW: 15 % (ref 11.5–15.5)
WBC: 5.5 10*3/uL (ref 4.0–10.5)
nRBC: 0 % (ref 0.0–0.2)

## 2023-07-16 LAB — IRON AND TIBC
Iron: 61 ug/dL (ref 28–170)
Saturation Ratios: 13 % (ref 10.4–31.8)
TIBC: 472 ug/dL — ABNORMAL HIGH (ref 250–450)
UIBC: 411 ug/dL

## 2023-07-16 LAB — FERRITIN: Ferritin: 11 ng/mL (ref 11–307)

## 2023-07-16 LAB — MAGNESIUM: Magnesium: 2.2 mg/dL (ref 1.7–2.4)

## 2023-07-16 LAB — HEMOGLOBIN A1C
Hgb A1c MFr Bld: 5.2 % (ref 4.8–5.6)
Mean Plasma Glucose: 102.54 mg/dL

## 2023-07-16 LAB — BRAIN NATRIURETIC PEPTIDE: B Natriuretic Peptide: 190 pg/mL — ABNORMAL HIGH (ref 0.0–100.0)

## 2023-07-16 NOTE — Patient Instructions (Addendum)
TAKE METOLAZONE 2.5 MG TODAY WITH AN EXTRA 40 OF POTASSIUM.  Labs done today, your results will be available in MyChart, we will contact you for abnormal readings.  You have been [facility].  They will call you to arrange your appointment.  I have messaged your electrophysiologist office about getting a follow up appointment. They will call you to arrange your appointment.  PLEASE KEEP YOUR FOLLOW UP ON 08/06/23   If you have any questions or concerns before your next appointment please send [place] a message through [place] or call our office at [phone].    TO LEAVE A MESSAGE FOR THE NURSE SELECT OPTION 2, PLEASE LEAVE A MESSAGE INCLUDING: YOUR NAME DATE OF BIRTH CALL BACK NUMBER REASON FOR CALL**this is important as we prioritize the call backs  YOU WILL RECEIVE A CALL BACK THE SAME DAY AS LONG AS YOU CALL BEFORE 4:00 PM  At the [facility], you and your health needs are our priority. As part of our continuing mission to provide you with exceptional heart care, we have created designated Provider Care Teams. These Care Teams include your primary Cardiologist (physician) and Advanced Practice Providers (APPs- Physician Assistants and Nurse Practitioners) who all work together to provide you with the care you need, when you need it.   You may see any of the following providers on your designated Care Team at your next follow up: Dr Arvilla Meres Dr Marca Ancona Dr. Dorthula Nettles Dr. Clearnce Hasten Amy Filbert Schilder, NP Robbie Lis, [place] [facility] [place], [place] Brynda Peon, NP [place] Lee, NP Clarisa Kindred, NP Karle Plumber, PharmD Enos Fling, PharmD   Please be sure to bring in all your medications bottles to every appointment.    Thank you for choosing [facility]

## 2023-07-16 NOTE — Progress Notes (Signed)
ReDS Vest / Clip - 07/16/23 1200       ReDS Vest / Clip   Station Marker B    Ruler Value 34    ReDS Value Range Moderate volume overload    ReDS Actual Value 38

## 2023-07-16 NOTE — Progress Notes (Signed)
Advanced Heart Failure Clinic Note   PCP: Myrlene Broker, MD Primary Cardiologist: Lesleigh Noe, MD (Inactive)  HF Cardiologist: Dr. 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   Admitted 7/19 with CP Symptoms thought to be related to frequent PVCs. EP consulted and scheduled her for PVC ablation. Echo repeated 11/22/17 and showed improved EF 50-55%. S/p  PVC ablation 12/01/17. Was felt not to be successful.  Was on flecainide but discontinued due to reduced EF. Placed on amio   Echo 6/20 showed drop in EF back down to 25-30%. Was instructed to f/u in clinic but pt did not. She had outpatient monitor 04/2018 that showed rare PVCs.    Admitted 6/21 with chest pain and shortness of breath.ECHO EF < 20% and normal RV. Had cath with normal cors and preserved cardiac output. Plan was to f/u with EP regarding PVCs. She has remained on AAD therapy w/ amiodarone + ? blocker therapy.  Admitted to [facility] 4/22 w/ increased dyspnea and volume overload. Also w/ increased PVC burden. Echo showed EF 20-25%, RV normal. She was diuresed w/ IV Lasix and placed on amiodarone gtt for PVC suppression. Seen by Dr. Ladona Ridgel, due to the location of her PVCs, she is not a candidate for re-do ablation.   S/p Barostim 6/22.  Zio 8/22: 13% PVCs (unifocal)  Seen by EP 12/22 and Barostim titrated. Not felt to have any other options for PVCs.   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  -> mexilitene increased to 300 bid  Cardiac PET 6/24 no ischemia/infarction. No active myocardial inflammation/ evidence of sarcoidosis. 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 Overall feeling fine. Feeling more PVCs recently. Wheezing more, she attributes this to being around nephew who had RSV. Using nebs.  Able to walk to mailbox and go up and down stairs w/o dyspnea. Energy "mediocre, but not bad." Denies abnormal bleeding, CP, dizziness, edema, or PND/Orthopnea. Appetite ok. No fever or chills. Weight at home 207 pounds. Taking all medications. Last took metolazone 2 days ago. Not urinating as briskly on torsemide. Agreeable to starting GLP1RA.  Cardiac Studies   - Cardiac PET (6/24):    FDG uptake was not observed. LV perfusion is normal. There is no evidence of ischemia. There is no evidence of infarction.   Coronary calcium was absent on the attenuation correction CT images.   FDG uptake findings are inconsistent with active myocardial inflammation/sarcoidosis.   No gated images or EF due to interference from AICD leads  - Echo (7/23): EF 25-30%  - Echo (1/23): EF 25-30% RV normal   - Echo (4/22): EF 20-25%, RV normal   - PFTs w/ DLOC (4/22) Mild Restriction Normal Diffusion  - CPX (7/21) FVC 2.32 (70%)      FEV1 1.99 (74%)        FEV1/FVC 86 (105%)        MVV 110 (105%) Resting HR: 94 Peak HR: 175   (100% age predicted max HR)  BP rest: 126/82 BP peak: 202/78 Peak VO2: 17.6 (86% predicted peak VO2) - corrects to 28.4 for ibw VE/VCO2 slope:  37 OUES: 1.74 Peak RER: 1.08 Ventilatory Threshold: 14.1 (69% predicted or measured peak VO2) VE/MVV:  53% PETCO2 at peak:   O2pulse:  10   (83% predicted O2pulse)  - RHC (6/21):   RA = 4 RV = 32/6 PA = 31/8 (21) PCW = 12 Fick cardiac output/index = 7.0/3.4 Thermo CO/CI = 5.2/2.5 PVR =1.7 (Thermo) Ao sat = 98% PA sat = 67%, 69%  - Echo (6/21): EF < 20% RV normal   - Echo (6/20): EF 25-30%  - Echo (7/19): EF 50-55%  - CPX (12/18): pVO2 14.0 (corrects to 23.0 for IBW) VeVCO2  32 RER  1.0  - Echo (8/18): EF 25%   - CPX (2/18): FVC 2.57 (79%)      FEV1 2.14 (81%)        FEV1/FVC 83 (101%)        MVV 107 (102%) Resting HR: 106 Peak HR: 166   (93% age predicted max HR) BP rest: 136/98 BP peak: 174/88 Peak VO2: 17.1 (85% predicted peak VO2) - corrects to 28.4 for ibw VE/VCO2 slope:  33 OUES: 2.16 Peak RER: 1.09 Ventilatory Threshold: 14.6 (73% predicted or measured peak VO2) VE/MVV:  59% PETCO2 at peak:  33 O2pulse:  10   (83% predicted O2pulse)  - Echo (12/17) 20-25%  - Cath (6/17): revealed normal vessels, moderate elevation in pulmonary artery pressures, and LVEF 25-30%.  - Echo (6/17): 25-30%  - Echo (6/15): EF 35-40%   Review of systems complete and found to be negative unless listed in HPI.    Past Medical History:  Diagnosis Date   Acute on chronic systolic (congestive) heart failure (HCC) 04/29/2017   Acute pain of right shoulder 06/16/2017   AICD (automatic cardioverter/defibrillator) present    Anginal pain (HCC)    Anxiety    Arthritis    right shoulder    Asthma    CHF (congestive heart failure) (HCC)    Chronic combined systolic and diastolic heart failure (HCC) 03/05/2014   Closed low lateral malleolus fracture 10/23/2013   Cystitis 10/21/2017   Depression    Depression with anxiety 01/20/2013   Diabetes mellitus without complication (HCC)    Diverticulosis    Dyspnea    comes and goes intermittently mostly with exertion    Dysrhythmia    Essential hypertension    Prev followed by [name]    Fibroid    age 66   Gallstones    Generalized abdominal cramping    History of cardiomyopathy    Hypertension    IBS (irritable bowel syndrome)    ICD (implantable cardioverter-defibrillator), single, 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 CPAP intolerance >> "smothering" Good tolerance of auto device (optimal pressure 12-13 on download).  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     albuterol (VENTOLIN HFA) 108 (90 Base) MCG/ACT inhaler Inhale 1-2 puffs into the lungs every 6 (six) hours as needed for wheezing or shortness of breath. 18 g 0   amiodarone (PACERONE) 200 MG tablet Take 1 tablet (200 mg total) by mouth daily. 90 tablet 3   budesonide-formoterol (SYMBICORT) 160-4.5 MCG/ACT inhaler Inhale 2 puffs into the lungs 2 (two) times daily as needed. 10 g 3   dicyclomine (BENTYL) 20 MG tablet Take 1 tablet (20 mg total) by mouth daily as needed for spasms. 90 tablet 3   fluticasone (FLONASE) 50 MCG/ACT nasal spray Place 2 sprays into both nostrils daily as needed for allergies. 16 g 3   gabapentin (NEURONTIN) 300 MG capsule Take 1 capsule (300 mg total) by mouth at bedtime as needed (back spasms). 90 capsule 3   ipratropium-albuterol (DUONEB) 0.5-2.5 (3) MG/3ML SOLN Take 3 mLs by nebulization every 6 (six) hours as needed. 360 mL 1   ivabradine (CORLANOR) 7.5 MG TABS tablet Take 1 tablet (7.5 mg total) by mouth 2 (two) times daily with a meal. 60 tablet 11   JARDIANCE  25 MG TABS tablet TAKE 1 TABLET BY MOUTH ONCE DAILY BEFORE BREAKFAST 90 tablet 0   Magnesium Oxide 200 MG TABS Take 1 tablet (200 mg total) by mouth daily. 90 tablet 3   metolazone (ZAROXOLYN) 2.5 MG tablet TAKE 1 TABLET BY MOUTH AS DIRECTED 5 tablet 1   metoprolol succinate (TOPROL-XL) 100 MG 24 hr tablet Take 1 tablet (100 mg total) by mouth daily. Take with or immediately following a meal. 90 tablet 3   mexiletine (MEXITIL) 150 MG capsule Take 2 capsules (300 mg total) by mouth 2 (two) times daily. 120 capsule 11   Multiple Vitamins-Minerals (MULTIVITAMIN GUMMIES WOMENS PO) Take 2 tablets by mouth daily.     naproxen sodium (ALEVE) 220 MG tablet Take 220-440 mg by mouth 2 (two) times daily as needed (Pain).     nitroGLYCERIN (NITROSTAT) 0.4 MG SL tablet Place 1 tablet (0.4 mg total) under the tongue every 5 (five) minutes as needed for chest pain. 75 tablet 0   potassium chloride SA (KLOR-CON M) 20 MEQ tablet Take 20 mEq by mouth daily.     sacubitril-valsartan (ENTRESTO) 97-103 MG Take 1 tablet by mouth twice daily 180 tablet 3   spironolactone (ALDACTONE) 25 MG tablet Take 1 tablet (25 mg total) by mouth at bedtime. 90 tablet 3   tizanidine (ZANAFLEX) 2 MG capsule Take 2 mg by mouth as needed for muscle spasms.     torsemide (DEMADEX) 20 MG tablet Take 2 tablets (40 mg total) by mouth 2 (two) times daily. 360 tablet 2   Ubrogepant (UBRELVY) 100 MG TABS Take 1 tablet (100 mg total) by mouth daily as needed (migraine). 30 tablet 3   Vitamin D, Ergocalciferol, (DRISDOL) 1.25 MG (50000 UNIT) CAPS capsule Take 1 capsule by mouth once a week 12 capsule 0   tirzepatide (ZEPBOUND) 2.5 MG/0.5ML Pen Inject 2.5 mg into the skin once a week. (Patient not  taking: Reported on 07/16/2023) 2 mL 0   No current facility-administered medications for this encounter.   Allergies  Allergen Reactions   Vancomycin Other (See Comments)    "did something to my kidneys," PROGRESSED TO KIDNEY FAILURE!!   Aspirin  Other (See Comments)    Wheezing, (Pt states that she just wheezes some when she takes aspirin by itself but she can take aspirin in a combination product).   Contrast Media [Iodinated Contrast Media] Other (See Comments)    Multiple CT contrast studies done over 2 weeks caused ARF   Ciprofloxacin Itching and Rash   Cyclobenzaprine Other (See Comments)    Can not tolerate   Farxiga [Dapagliflozin] Rash   Sulfa Antibiotics Itching and Rash   Social History   Socioeconomic History   Marital status: Married    Spouse name: Reita Cliche   Number of children: 1   Years of education: Not on file   Highest education level: Associate degree: academic program  Occupational History   Occupation: stay at home mom  Tobacco Use   Smoking status: Never   Smokeless tobacco: Never  Vaping Use   Vaping status: Never Used  Substance and Sexual Activity   Alcohol use: No   Drug use: No   Sexual activity: Yes    Birth control/protection: Surgical, Pill    Comment: BTL  Other Topics Concern   Not on file  Social History Narrative   Lives with husband, daughter niece and great nephew   Social Drivers of Corporate investment banker Strain: Low Risk  (06/08/2023)   Overall Financial Resource Strain (CARDIA)    Difficulty of Paying Living Expenses: Not very hard  Food Insecurity: No Food Insecurity (06/08/2023)   Hunger Vital Sign    Worried About Running Out of Food in the Last Year: Never true    Ran Out of Food in the Last Year: Never true  Transportation Needs: Unmet Transportation Needs (06/08/2023)   PRAPARE - Administrator, Civil Service (Medical): Yes    Lack of Transportation (Non-Medical): No  Physical Activity: Insufficiently Active (06/08/2023)   Exercise Vital Sign    Days of Exercise per Week: 1 day    Minutes of Exercise per Session: 10 min  Stress: Stress Concern Present (06/08/2023)   [employer] of Occupational Health - Occupational Stress Questionnaire     Feeling of Stress : Very much  Social Connections: Socially Isolated (06/08/2023)   Social Connection and Isolation Panel [NHANES]    Frequency of Communication with Friends and Family: Never    Frequency of Social Gatherings with Friends and Family: Never    Attends Religious Services: Never    Database administrator or Organizations: No    Attends Banker Meetings: Never    Marital Status: Married  Catering manager Violence: Not At Risk (05/06/2023)   Humiliation, Afraid, Rape, and Kick questionnaire    Fear of Current or Ex-Partner: No    Emotionally Abused: No    Physically Abused: No    Sexually Abused: No   Family History  Problem Relation Age of Onset   Emphysema Maternal Grandmother        smoked   Heart disease Maternal Grandmother 22       MI   Rheum arthritis Mother    Allergies Daughter    Colon cancer Neg Hx    BP 112/80   Pulse 83   Wt 97 kg (213 lb 12.8 oz)  SpO2 97%   BMI 35.58 kg/m   Wt Readings from Last 3 Encounters:  07/16/23 97 kg (213 lb 12.8 oz)  06/11/23 100.2 kg (221 lb)  06/01/23 97.3 kg (214 lb 9.6 oz)   PHYSICAL EXAM: General:  NAD. No resp difficulty, walked into clinic HEENT: Normal Neck: Supple. JVP 8-10 Cor: Irregular rate & rhythm. No rubs, gallops or murmurs. Lungs: Clear Abdomen: Soft, obese, nontender, nondistended.  Extremities: No cyanosis, clubbing, rash, edema Neuro: Alert & oriented x 3, moves all 4 extremities w/o difficulty. Affect pleasant.  Device interrogation (personally reviewed): HL Score 43, thoracic impedence looks ok, average HR 101 bpm, 1 hr/day activity, no VT  ECG (personally reviewed): SR with frequent PVCs (bigeminy)   ReDs reading: 38%, abnormal  ASSESSMENT & PLAN:  1. Chronic systolic HF:   - due to peri-partum CM, onset 1999.  - S/P [employer] ICD 07/2016 - s/p Barostim 6/22 - LHC 6/17 No CAD - Echo 12/17 EF 20-25% - Echo 8/18 EF 25-30% s/p ICD 3/18.  - CPX 12/18 showed  moderate limitation due mostly to obesity with some HF component - Echo 12/19 EF 20-25%. RV ok  - Echo 6/21 EF < 20%. - Echo 7/23 EF 25-30% - Recent Zio results worrisome for LMNA CM. 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 Suspect progressive course. Has been referred to Dr. Joseph>>Variant is interpreted as variant of unknown clinical significance. Per Dr. Conan Bowens, as there are no affected family members to determine if this variant segregates with disease, genetic testing her daughter for this familial TTN variant is unwarranted  - Cardiac PET 6/24: no ischemia/infarction. No active myocardial inflammation/ evidence of sarcoidosis - Remains NYHA III.  Volume labile, but up on HL score and ReDs 38%, exam not as impressive for fluid - Take metolazone 2.5 mg + extra 40 KCL today.  - Continue torsemide 40 mg bid. Following with ICM clinic - Continue Jardiance 25 mg daily. - Continue Entesto 97/103 mg bid. - Continue spiro 25 mg qhs. - Continue Toprol 100 daily  - Continue Corlanor 7.5 mg bid. - Consider re-trial of digoxin? - Unclear if PVCs are a result of her CM or contributing to it (or both). Failed previous PVC ablation. C/w high burden despite 2 AAD therapy regimen w/ amiodarone and mexiletine.  - Suspect she may need advanced therapies in future. - Labs today. - Update echo  2. Frequent PVCs/ Bigeminy - Holter Monitor 6/19 with 30% PVCs with one primary morphology.  - S/p PVC ablation 11/2017 with Dr Ladona Ridgel unsuccessful - Off flecainide with EF down and persistent PVCs.  - Zio Patch 04/2019 > 17% PVCs and >8% couplets.  - Zio Patch 11/28/2019 8.5 % PVCs - Zio 9/22 13% PVCs (unifocal)  - Per Dr. Ladona Ridgel, due to the location of her PVCs, she is not a candidate for re-do ablation. Could consider referral to tertiary center - Zio 14 day (12/23) showed frequent NSVT, 13% PVC burden (see discussion above) - Cardiac PET 6/24 negative for sarcoid  - Continue  amiodarone 200 mg daily. - Continue mexiletine 300 mg bid.  - Amio labs reviewed from 01/21/23 and are stable - Due for EP follow up - update echo  3. Morbid obesity - s/p gastric Sleeve at [facility] 09/20/17  - Body mass index is 35.58 kg/m. - [facility]erred to [facility]  - Agreeable to start GLP1RA, refer to pharmD - Check A1C today   4.  Daytime Fatigue - Sleep study in 2019 and again in 2021 were both negative for sleep apnea  - No change.  5. DM 2 - On Jardiance - Per PCP - No change  Update echo. Keep follow up with APP in 3 weeks as scheduled.  [name] Ganong, FNP  11:39 AM

## 2023-07-19 ENCOUNTER — Other Ambulatory Visit (HOSPITAL_COMMUNITY): Payer: Self-pay

## 2023-07-19 ENCOUNTER — Telehealth (HOSPITAL_COMMUNITY): Payer: Self-pay

## 2023-07-19 ENCOUNTER — Ambulatory Visit: Payer: PPO | Attending: Internal Medicine

## 2023-07-19 DIAGNOSIS — I5022 Chronic systolic (congestive) heart failure: Secondary | ICD-10-CM

## 2023-07-19 DIAGNOSIS — Z95810 Presence of automatic (implantable) cardiac defibrillator: Secondary | ICD-10-CM

## 2023-07-19 MED ORDER — POTASSIUM CHLORIDE CRYS ER 20 MEQ PO TBCR: 40.0000 meq | EXTENDED_RELEASE_TABLET | Freq: Every day | ORAL | 3 refills | Status: DC

## 2023-07-19 NOTE — Telephone Encounter (Addendum)
 Pt aware, agreeable, and verbalized understanding  Labs ordered and scheduled Rx updated and iron orders are in. Sent to [facility] to PA for infusion   ----- Message from [name] Ganong sent at 07/16/2023  4:08 PM EST ----- Tsat and iron are low, could be contributing to some of her symptoms. Please arrange iron infusion if she is agreeable.  K is low. Please increase daily KCL to 40 daily, remember to take EXTRA 40 on metolazone days.  Repeat BMET in 1 week please

## 2023-07-21 ENCOUNTER — Encounter: Payer: Self-pay | Admitting: Pharmacist

## 2023-07-21 NOTE — Progress Notes (Unsigned)
 EPIC Encounter for ICM Monitoring  Patient Name: [name] is a 48 y.o. female Date: 07/21/2023 Primary Care Physican: Myrlene Broker, MD Primary Cardiologist: Bensimhon Electrophysiologist: Graciela Husbands 12/28/2022 Weight: 218 lbs       01/27/2023 Weight: 200 lbs     02/03/2023 Weight: 200 lbs 04/21/2023 Weight: 207 lbs (baseline 200-203) 06/01/2023 Office Weight: 214 lbs 07/21/2023 Weight: 206 lbs (baseline 200-203)   Spoke with patient and heart failure questions reviewed.  Transmission results reviewed.  Pt reports weight is still 4 lbs above baseline and complains of coughing and wheezing.  Jessica recommended iron infusions.  ReDS 38% at 2/21 OV     Heartlogic HF Index 38 suggesting possible fluid accumulation starting 1/27.  Index dropped from 43 to 38 after taking Metolazone 2/21 as ordered at HF clinic appt.  Thoracic index improving and trending up.     Prescribed: Torsemide 20 mg Take 2 tablets (40 mg total) by mouth twice a day Potassium 20 mEq take 2 tablet(s) (40 mEq total) by mouth daily Metolazone 2.5 mg take 1 tablet (2.5 mg total) by mouth as directed (last dosage 2/25) Spironolactone 25 mg take 1 tablet (25 mg total) at bedtime.    Labs: 07/27/2023 BMET Scheduled at HF clinic 07/16/2023 Creatinine 0.84, BUN 7,   Potassium 3.3, Sodium 137, GFR >60 06/21/2023 Creatinine 0.97, BUN 6,   Potassium 4.6, Sodium 137, GFR >60 06/01/2023 Creatinine 0.86, BUN 7,   Potassium 3.2, Sodium 138, GFR >60 05/12/2023 Creatinine 1.01, BUN 13, Potassium 3.4, Sodium 141, GFR >60 01/21/2023 Creatinine 0.90, BUN 7,   Potassium 4.2, Sodium 141, BNP 1,167.9  10/28/2022 Creatinine 0.83, BUN 9,   Potassium 3.1, Sodium 136, GFR >60 10/23/2022 Creatinine 0.98, BUN 5,   Potassium 3.4, Sodium 137, GFR >60 07/30/2022 Creatinine 0.85, BUN 6,   Potassium 3.6, Sodium 136, GFR >60 07/03/2022 Creatinine 0.93, BUN 5,   Potassium 3.6, Sodium 136, GFR 76 A complete set of results can be found in Results  Review.   Recommendations:  Pt plans on taking Metolazone + KCL 2/27 (also took 2/25) which is 2nd dosage this week.   Sent to [facility] as FYI.      Follow-up plan: ICM clinic phone appointment on 07/28/2023 to recheck fluid levels.  91 day device clinic remote transmission 09/22/2023.              EP/Cardiology next office visit:   Provided EP scheduling number and she will call for appt.  Recall 05/27/2023 with Otilio Saber, PA.   08/06/2023 with HF Clinic.         Copy of ICM check sent to Dr. Graciela Husbands.  3 Month HeartLogicT Heart Failure Index:    8 Day Data Trend:          Karie Soda, RN 07/21/2023 12:06 PM

## 2023-07-23 ENCOUNTER — Ambulatory Visit: Payer: PPO | Attending: Internal Medicine | Admitting: Internal Medicine

## 2023-07-23 ENCOUNTER — Encounter (HOSPITAL_COMMUNITY): Payer: Self-pay | Admitting: Internal Medicine

## 2023-07-23 ENCOUNTER — Encounter: Payer: Self-pay | Admitting: Internal Medicine

## 2023-07-23 VITALS — BP 121/79 | HR 108 | Ht 65.0 in | Wt 210.6 lb

## 2023-07-23 DIAGNOSIS — E039 Hypothyroidism, unspecified: Secondary | ICD-10-CM

## 2023-07-23 DIAGNOSIS — I493 Ventricular premature depolarization: Secondary | ICD-10-CM | POA: Diagnosis not present

## 2023-07-23 DIAGNOSIS — Z95810 Presence of automatic (implantable) cardiac defibrillator: Secondary | ICD-10-CM

## 2023-07-23 DIAGNOSIS — I428 Other cardiomyopathies: Secondary | ICD-10-CM | POA: Diagnosis not present

## 2023-07-23 LAB — CUP PACEART INCLINIC DEVICE CHECK
Date Time Interrogation Session: 20250228193420
HighPow Impedance: 116 Ohm
Implantable Lead Connection Status: 753985
Implantable Lead Implant Date: 20180312
Implantable Lead Location: 753860
Implantable Lead Model: 293
Implantable Lead Serial Number: 422842
Implantable Pulse Generator Implant Date: 20180312
Lead Channel Impedance Value: 736 Ohm
Lead Channel Pacing Threshold Amplitude: 1.6 V
Lead Channel Pacing Threshold Pulse Width: 0.4 ms
Lead Channel Sensing Intrinsic Amplitude: 21.8 mV
Lead Channel Setting Pacing Amplitude: 2.5 V
Lead Channel Setting Pacing Pulse Width: 0.4 ms
Lead Channel Setting Sensing Sensitivity: 0.5 mV
Pulse Gen Serial Number: 226601
Zone Setting Status: 755011

## 2023-07-23 NOTE — Patient Instructions (Signed)
 Medication Instructions:  Your physician recommends that you continue on your current medications as directed. Please refer to the Current Medication list given to you today.  *If you need a refill on your cardiac medications before your next appointment, please call your pharmacy*   Lab Work: BMET and TSH today If you have labs (blood work) drawn today and your tests are completely normal, you will receive your results only by: MyChart Message (if you have MyChart) OR A paper copy in the mail If you have any lab test that is abnormal or we need to change your treatment, we will call you to review the results.   Testing/Procedures: None ordered.    Follow-Up: At [facility], you and your health needs are our priority.  As part of our continuing mission to provide you with exceptional heart care, we have created designated Provider Care Teams.  These Care Teams include your primary Cardiologist (physician) and Advanced Practice Providers (APPs -  Physician Assistants and Nurse Practitioners) who all work together to provide you with the care you need, when you need it.  We recommend signing up for the patient portal called "MyChart".  Sign up information is provided on this After Visit Summary.  MyChart is used to connect with patients for Virtual Visits (Telemedicine).  Patients are able to view lab/test results, encounter notes, upcoming appointments, etc.  Non-urgent messages can be sent to your provider as well.   To learn more about what you can do with MyChart, go to [email or web address].    Your next appointment:   6 months with Dr Ladona Ridgel  Please follow up with your insurance company to see if [facility] or [facility] is in-network  [facility] [employer]  [employer]

## 2023-07-23 NOTE — Progress Notes (Signed)
 Patient Care Team: Myrlene Broker, MD as PCP - General (Internal Medicine) Bensimhon, Bevelyn Buckles, MD as PCP - Advanced Heart Failure (Cardiology) Lyn Records, MD (Inactive) as PCP - Cardiology (Cardiology) Marinus Maw, MD as PCP - Electrophysiology (Cardiology) Nyoka Cowden, MD (Pulmonary Disease) Milagros Evener, MD (Psychiatry) Lewayne Bunting, MD (Cardiology) Oretha Milch, MD (Pulmonary Disease) Kathyrn Sheriff, [facility] (Inactive) as Pharmacist (Pharmacist) Patton Salles, MD as Consulting Physician (Obstetrics and Gynecology)   HPI  [name] is a 48 y.o. female patient of Dr. Leonia Reeves with an implanted [employer] defibrillator  Nonischemic cardiomyopathy initially thought to be peripartum.  7/19 noted to have frequent PVCs.  An ablation undertaken in the setting of a near normal LV function.  Ablation was not thought to be successful.  Treated with flecainide but then there is further reduction of left ventricular function flecainide was discontinued.  Because of persistent LV dysfunction amiodarone was initiated.  Redo ablation was not felt to be possible.  Next team was added  Underwent Baro-Stim 6/22. 6/24 cardiac PET showed no evidence of inflammation or sarcoidosis  Significant symptoms palpitations feeling in her throat.  Associate with shortness of breath.  Increasing fatigue.  Struggles to maintain her positive attitude and has become increasingly socially isolated  Records and Results Reviewed   Past Medical History:  Diagnosis Date   Acute on chronic systolic (congestive) heart failure (HCC) 04/29/2017   Acute pain of right shoulder 06/16/2017   AICD (automatic cardioverter/defibrillator) present    Anginal pain (HCC)    Anxiety    Arthritis    right shoulder    Asthma    CHF (congestive heart failure) (HCC)    Chronic combined systolic and diastolic heart failure (HCC) 03/05/2014   Closed low lateral malleolus  fracture 10/23/2013   Cystitis 10/21/2017   Depression    Depression with anxiety 01/20/2013   Diabetes mellitus without complication (HCC)    Diverticulosis    Dyspnea    comes and goes intermittently mostly with exertion    Dysrhythmia    Essential hypertension    Prev followed by [name]    Fibroid    age 6   Gallstones    Generalized abdominal cramping    History of cardiomyopathy    Hypertension    IBS (irritable bowel syndrome)    ICD (implantable cardioverter-defibrillator), single, 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 CPAP intolerance >> "smothering" Good tolerance of auto device (optimal pressure 12-13 on download).  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   PVC (premature ventricular contraction) 06/23/2016   Seizures (HCC)    "as a child" (08/03/2016)   Termination of pregnancy    due to cardiac risk    Past Surgical History:  Procedure Laterality Date   BREAST BIOPSY Right 09/30/2022   [place] RT BREAST BX W LOC DEV 1ST LESION IMG BX SPEC US GUIDE 09/30/2022 [facility]   CARDIAC CATHETERIZATION N/A 11/11/2015   Procedure: Right/Left Heart Cath and Coronary Angiography;  Surgeon: Lennette Bihari, MD;  Location: [facility] INVASIVE CV LAB;  Service: Cardiovascular;  Laterality: N/A;   CARDIAC CATHETERIZATION  ~ 2015   CARDIAC DEFIBRILLATOR  PLACEMENT  08/03/2016   CESAREAN SECTION  1999   COLONOSCOPY WITH PROPOFOL N/A 04/21/2016   Procedure: COLONOSCOPY WITH PROPOFOL;  Surgeon: Beverley Fiedler, MD;  Location: [facility];  Service: Gastroenterology;  Laterality: N/A;   ESOPHAGOGASTRODUODENOSCOPY (EGD) WITH PROPOFOL N/A 04/21/2016   Procedure: ESOPHAGOGASTRODUODENOSCOPY (EGD) WITH PROPOFOL;  Surgeon: Beverley Fiedler, MD;  Location: [facility];  Service: Gastroenterology;  Laterality: N/A;   FOOT FRACTURE SURGERY Right ~ 2003   FRACTURE SURGERY     ICD IMPLANT N/A 08/03/2016   Procedure: ICD Implant;  Surgeon: Duke Salvia, MD;  Location: [facility] INVASIVE CV LAB;  Service: Cardiovascular;  Laterality: N/A;   IR ANGIOGRAM PELVIS SELECTIVE OR SUPRASELECTIVE  10/28/2022   IR ANGIOGRAM SELECTIVE EACH ADDITIONAL VESSEL  10/28/2022   IR ANGIOGRAM SELECTIVE EACH ADDITIONAL VESSEL  10/28/2022   IR ANGIOGRAM SELECTIVE EACH ADDITIONAL VESSEL  10/28/2022   IR EMBO TUMOR ORGAN ISCHEMIA INFARCT INC GUIDE ROADMAPPING  10/28/2022   IR US GUIDE VASC ACCESS LEFT  10/28/2022   IR US GUIDE VASC ACCESS RIGHT  10/28/2022   LAPAROSCOPIC CHOLECYSTECTOMY  12/2006   LAPAROSCOPIC GASTRIC SLEEVE RESECTION     LAPAROSCOPY ABDOMEN DIAGNOSTIC  2008   "cut bile duct w/gallbladder OR; had to go in later & fix leak; hospitalized for 2 months"   LEFT HEART CATHETERIZATION WITH CORONARY ANGIOGRAM N/A 02/26/2014   Procedure: LEFT HEART CATHETERIZATION WITH CORONARY ANGIOGRAM;  Surgeon: Corky Crafts, MD;  Location: [facility] CATH LAB;  Service: Cardiovascular;  Laterality: N/A;   OVARIAN CYST REMOVAL Right 1999   PVC ABLATION N/A 12/01/2017   Procedure: PVC ABLATION;  Surgeon: Marinus Maw, MD;  Location: [facility] CV LAB;  Service: Cardiovascular;  Laterality: N/A;   RIGHT HEART CATH N/A 08/05/2017   Procedure: RIGHT HEART CATH;  Surgeon: Dolores Patty, MD;  Location: [facility] CV [facility];  Service: Cardiovascular;  Laterality: N/A;   RIGHT HEART CATH N/A 11/16/2019    Procedure: RIGHT HEART CATH;  Surgeon: Dolores Patty, MD;  Location: [facility] CV [facility];  Service: Cardiovascular;  Laterality: N/A;   TUBAL LIGATION  1999    Current Meds  Medication Sig   acetaminophen (TYLENOL) 650 MG CR tablet Take 650 mg by mouth every 8 (eight) hours as needed for pain (headache).   albuterol (VENTOLIN HFA) 108 (90 Base) MCG/ACT inhaler Inhale 1-2 puffs into the lungs every 6 (six) hours as needed for wheezing or shortness of breath.   amiodarone (PACERONE) 200 MG tablet Take 1 tablet (200 mg total) by mouth daily.   budesonide-formoterol (SYMBICORT) 160-4.5 MCG/ACT inhaler Inhale 2 puffs into the lungs 2 (two) times daily as needed.   dicyclomine (BENTYL) 20 MG tablet Take 1 tablet (20 mg total) by mouth daily as needed for spasms.   fluticasone (FLONASE) 50 MCG/ACT nasal spray Place 2 sprays into both nostrils daily as needed for allergies.   gabapentin (NEURONTIN) 300 MG capsule Take 1 capsule (300 mg total) by mouth at bedtime as needed (back spasms).   ipratropium-albuterol (DUONEB) 0.5-2.5 (3) MG/3ML SOLN  Take 3 mLs by nebulization every 6 (six) hours as needed.   ivabradine (CORLANOR) 7.5 MG TABS tablet Take 1 tablet (7.5 mg total) by mouth 2 (two) times daily with a meal.   JARDIANCE 25 MG TABS tablet TAKE 1 TABLET BY MOUTH ONCE DAILY BEFORE BREAKFAST   Magnesium Oxide 200 MG TABS Take 1 tablet (200 mg total) by mouth daily.   metolazone (ZAROXOLYN) 2.5 MG tablet TAKE 1 TABLET BY MOUTH AS DIRECTED   metoprolol succinate (TOPROL-XL) 100 MG 24 hr tablet Take 1 tablet (100 mg total) by mouth daily. Take with or immediately following a meal.   mexiletine (MEXITIL) 150 MG capsule Take 2 capsules (300 mg total) by mouth 2 (two) times daily.   Multiple Vitamins-Minerals (MULTIVITAMIN GUMMIES WOMENS PO) Take 2 tablets by mouth daily.   naproxen sodium (ALEVE) 220 MG tablet Take 220-440 mg by mouth 2 (two) times daily as needed (Pain).   nitroGLYCERIN (NITROSTAT) 0.4  MG SL tablet Place 1 tablet (0.4 mg total) under the tongue every 5 (five) minutes as needed for chest pain.   potassium chloride SA (KLOR-CON M) 20 MEQ tablet Take 2 tablets (40 mEq total) by mouth daily.   sacubitril-valsartan (ENTRESTO) 97-103 MG Take 1 tablet by mouth twice daily   spironolactone (ALDACTONE) 25 MG tablet Take 1 tablet (25 mg total) by mouth at bedtime.   tirzepatide (ZEPBOUND) 2.5 MG/0.5ML Pen Inject 2.5 mg into the skin once a week.   tizanidine (ZANAFLEX) 2 MG capsule Take 2 mg by mouth as needed for muscle spasms.   torsemide (DEMADEX) 20 MG tablet Take 2 tablets (40 mg total) by mouth 2 (two) times daily.   Ubrogepant (UBRELVY) 100 MG TABS Take 1 tablet (100 mg total) by mouth daily as needed (migraine).   Vitamin D, Ergocalciferol, (DRISDOL) 1.25 MG (50000 UNIT) CAPS capsule Take 1 capsule by mouth once a week    Allergies  Allergen Reactions   Vancomycin Other (See Comments)    "did something to my kidneys," PROGRESSED TO KIDNEY FAILURE!!   Aspirin Other (See Comments)    Wheezing, (Pt states that she just wheezes some when she takes aspirin by itself but she can take aspirin in a combination product).   Contrast Media [Iodinated Contrast Media] Other (See Comments)    Multiple CT contrast studies done over 2 weeks caused ARF   Ciprofloxacin Itching and Rash   Cyclobenzaprine Other (See Comments)    Can not tolerate   Farxiga [Dapagliflozin] Rash   Sulfa Antibiotics Itching and Rash      Review of Systems negative except from HPI and PMH  Physical Exam BP 121/79   Pulse (!) 108   Ht 5\' 5"  (1.651 m)   Wt 210 lb 9.6 oz (95.5 kg)   SpO2 94%   BMI 35.05 kg/m  Well developed and well nourished in no acute distress HENT normal E scleral and icterus clear Neck Supple JVP flat; carotids brisk and full Clear to ausculation Regular rate and rhythm, no murmurs gallops or rub Soft with active bowel sounds No clubbing cyanosis  Edema Alert and oriented,  grossly normal motor and sensory function Skin Warm and Dry  ECG sinus at 108 Interval 17/10/38 PVCs in a pattern of bigeminy with a left bundle inferior axis morphology and intrinsicoid deflection of about 100 ms and transition V2-V3     Device function is normal. 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 Reviewing her chart, and her ECG, I wonder whether she might not benefit from ablation for her PVCs.  They appear to be epicardial based on the intrinsicoid deflection she will reach out to her insurance company and find out whether she is eligible for referral to either [facility] and/or [facility] and will make the appropriate referral.  Will check her TSH as this may be contributing to her tachycardia although looking back over the last 5 years she has had tachycardia a lot.  Will also check her her BMET as her most recent potassium was low.   Also discussed the fatigue and the isolation that have occurred as a consequence of her illness.  Suggested the book of Psalms and restoration place  Current medicines are reviewed at length with the patient today .  The patient does not  have concerns regarding medicines.

## 2023-07-24 LAB — BASIC METABOLIC PANEL
BUN/Creatinine Ratio: 21 (ref 9–23)
BUN: 22 mg/dL (ref 6–24)
CO2: 26 mmol/L (ref 20–29)
Calcium: 10.5 mg/dL — ABNORMAL HIGH (ref 8.7–10.2)
Chloride: 94 mmol/L — ABNORMAL LOW (ref 96–106)
Creatinine, Ser: 1.07 mg/dL — ABNORMAL HIGH (ref 0.57–1.00)
Glucose: 103 mg/dL — ABNORMAL HIGH (ref 70–99)
Potassium: 4.6 mmol/L (ref 3.5–5.2)
Sodium: 138 mmol/L (ref 134–144)
eGFR: 64 mL/min/{1.73_m2} (ref >59)

## 2023-07-24 LAB — TSH: TSH: 1.75 u[IU]/mL (ref 0.450–4.500)

## 2023-07-26 ENCOUNTER — Encounter: Payer: Self-pay | Admitting: Internal Medicine

## 2023-07-27 ENCOUNTER — Ambulatory Visit (HOSPITAL_COMMUNITY)
Admission: RE | Admit: 2023-07-27 | Discharge: 2023-07-27 | Disposition: A | Discharge status: Home / Self Care | Payer: PPO | Source: Ambulatory Visit | Attending: Internal Medicine | Admitting: Internal Medicine

## 2023-07-27 DIAGNOSIS — I5022 Chronic systolic (congestive) heart failure: Secondary | ICD-10-CM | POA: Insufficient documentation

## 2023-07-27 LAB — BASIC METABOLIC PANEL
Anion gap: 12 (ref 5–15)
BUN: 25 mg/dL — ABNORMAL HIGH (ref 6–20)
CO2: 27 mmol/L (ref 22–32)
Calcium: 9.6 mg/dL (ref 8.9–10.3)
Chloride: 96 mmol/L — ABNORMAL LOW (ref 98–111)
Creatinine, Ser: 1.35 mg/dL — ABNORMAL HIGH (ref 0.44–1.00)
GFR, Estimated: 48 mL/min — ABNORMAL LOW (ref >60)
Glucose, Bld: 190 mg/dL — ABNORMAL HIGH (ref 70–99)
Potassium: 3.4 mmol/L — ABNORMAL LOW (ref 3.5–5.1)
Sodium: 135 mmol/L (ref 135–145)

## 2023-07-28 ENCOUNTER — Telehealth: Payer: Self-pay | Admitting: Pharmacist Clinician (PhC)/ Clinical Pharmacy Specialist

## 2023-07-28 ENCOUNTER — Other Ambulatory Visit (HOSPITAL_COMMUNITY): Payer: Self-pay

## 2023-07-28 ENCOUNTER — Telehealth: Payer: Self-pay | Admitting: Pharmacy Technician

## 2023-07-28 ENCOUNTER — Encounter: Payer: Self-pay | Admitting: Pharmacist Clinician (PhC)/ Clinical Pharmacy Specialist

## 2023-07-28 ENCOUNTER — Ambulatory Visit (INDEPENDENT_AMBULATORY_CARE_PROVIDER_SITE_OTHER): Payer: PPO

## 2023-07-28 ENCOUNTER — Ambulatory Visit: Attending: Cardiology | Admitting: Pharmacist Clinician (PhC)/ Clinical Pharmacy Specialist

## 2023-07-28 ENCOUNTER — Encounter (INDEPENDENT_AMBULATORY_CARE_PROVIDER_SITE_OTHER): Payer: Self-pay

## 2023-07-28 ENCOUNTER — Encounter: Payer: Self-pay | Admitting: Hematology and Oncology

## 2023-07-28 ENCOUNTER — Telehealth (HOSPITAL_COMMUNITY): Payer: Self-pay

## 2023-07-28 VITALS — Ht 65.0 in | Wt 215.4 lb

## 2023-07-28 DIAGNOSIS — I5022 Chronic systolic (congestive) heart failure: Secondary | ICD-10-CM

## 2023-07-28 DIAGNOSIS — Z95810 Presence of automatic (implantable) cardiac defibrillator: Secondary | ICD-10-CM

## 2023-07-28 DIAGNOSIS — G4733 Obstructive sleep apnea (adult) (pediatric): Secondary | ICD-10-CM | POA: Diagnosis not present

## 2023-07-28 MED ORDER — POTASSIUM CHLORIDE CRYS ER 20 MEQ PO TBCR: 40.0000 meq | EXTENDED_RELEASE_TABLET | Freq: Two times a day (BID) | ORAL | Status: DC

## 2023-07-28 NOTE — Patient Instructions (Signed)
 We will start the prior authorization process to get Zepbround covered by your insurance.   TIPS FOR SUCCESS Write down the reasons why you want to lose weight and post it in a place where you'll see it often. Start small and work your way up. Keep in mind that it takes time to achieve goals, and small steps add up. Any additional movements help to burn calories. Taking the stairs rather than the elevator and parking at the far end of your parking lot are easy ways to start. Brisk walking for at least 30 minutes 4 or more days of the week is an excellent goal to work toward  [employer] WHAT IT MEANS TO FEEL FULL Did you know that it can take 15 minutes or more for your brain to receive the message that you've eaten? That means that, if you eat less food, but consume it slower, you may still feel satisfied. Eating a lot of fruits and vegetables can also help you feel fuller. Eat off of smaller plates so that moderate portions don't seem too small  TITRATION PLAN Will plan to follow the titration plan as below, pending patient is tolerating each dose before increasing to the next. Can slow titration if needed for tolerability.    -Weeks 1-4: Inject 2.5 mg SQ once weekly  -Weeks 5-8: Inject 5 mg SQ once weekly  -Weeks 9-12 Inject 7.5 mg SQ once weekly  -Weeks 13-16: Inject 10 SQ once weekly   Follow up in 3 months.  If you have any questions or concerns, please reach out to [place].  Khyla Mccumbers/Chris at [phone].  THANK YOU FOR CHOOSING [facility]

## 2023-07-28 NOTE — Telephone Encounter (Signed)
 Pharmacy Patient Advocate Encounter   Received notification from Pt Calls Messages that prior authorization for zepbound is required/requested.   Insurance verification completed.   The patient is insured through [employer] ADVANTAGE/RX ADVANCE .   Per test claim: PA required; PA submitted to above mentioned insurance via CoverMyMeds Key/confirmation #/EOC [facility] Status is pending

## 2023-07-28 NOTE — Telephone Encounter (Signed)
 Spoke with patient regarding the following results. Patient made aware and patient verbalized understanding.   Patient will increase potassium to 40 meq BID  Medication list updated.   Advised patient to call back to office with any issues, questions, or concerns. Patient verbalized understanding.

## 2023-07-28 NOTE — Progress Notes (Signed)
 EPIC Encounter for ICM Monitoring  Patient Name: [name] is a 48 y.o. female Date: 07/28/2023 Primary Care Physican: Myrlene Broker, MD Primary Cardiologist: Bensimhon Electrophysiologist: Graciela Husbands 12/28/2022 Weight: 218 lbs       01/27/2023 Weight: 200 lbs     02/03/2023 Weight: 200 lbs 04/21/2023 Weight: 207 lbs (baseline 200-203) 06/01/2023 Office Weight: 214 lbs 07/21/2023 Weight: 206 lbs (baseline 200-203) 07/28/2023 Weight: 209 lbs   Spoke with patient and heart failure questions reviewed.  Transmission results reviewed.  Pt reports weight is still above baseline and has a cough but feeling better since fluid has resolved.    Heartlogic HF Index 7 suggesting fluid levels returned to normal after taking 3-4 metolazone dosages within the last week.     Prescribed: Torsemide 20 mg Take 2 tablets (40 mg total) by mouth twice a day Potassium 20 mEq take 2 tablet(s) (40 mEq total) by mouth daily Metolazone 2.5 mg take 1 tablet (2.5 mg total) by mouth as directed  Spironolactone 25 mg take 1 tablet (25 mg total) at bedtime.    Labs: 07/27/2023 Creatinine 1.35, BUN 25, Potassium 3.4, Sodium 135, GFR 48 07/23/2023 Creatinine 1.07, BUN 22, Potassium 4.6, Sodium 138, GFR 64 07/16/2023 Creatinine 0.84, BUN 7,   Potassium 3.3, Sodium 137, GFR >60 06/21/2023 Creatinine 0.97, BUN 6,   Potassium 4.6, Sodium 137, GFR >60 06/01/2023 Creatinine 0.86, BUN 7,   Potassium 3.2, Sodium 138, GFR >60 05/12/2023 Creatinine 1.01, BUN 13, Potassium 3.4, Sodium 141, GFR >60 01/21/2023 Creatinine 0.90, BUN 7,   Potassium 4.2, Sodium 141, BNP 1,167.9  10/28/2022 Creatinine 0.83, BUN 9,   Potassium 3.1, Sodium 136, GFR >60 10/23/2022 Creatinine 0.98, BUN 5,   Potassium 3.4, Sodium 137, GFR >60 07/30/2022 Creatinine 0.85, BUN 6,   Potassium 3.6, Sodium 136, GFR >60 07/03/2022 Creatinine 0.93, BUN 5,   Potassium 3.6, Sodium 136, GFR 76 A complete set of results can be found in Results Review.    Recommendations: No changes and encouraged to call if experiencing any fluid symptoms.    Follow-up plan: ICM clinic phone appointment on 08/23/2023.  91 day device clinic remote transmission 09/22/2023.              EP/Cardiology next office visit:   Recall 12/30/2023 with Dr Ladona Ridgel.   08/06/2023 with HF Clinic.  Last visit with Dr Graciela Husbands was 07/23/23.         Copy of ICM check sent to Dr. Graciela Husbands.  3 Month HeartLogicT Heart Failure Index:    8 Day Data Trend:          Karie Soda, RN 07/28/2023 3:29 PM

## 2023-07-28 NOTE — Assessment & Plan Note (Signed)
 Patient with diagnosis of OSA.  Patient has not met goal of at least 5% of body weight loss with comprehensive lifestyle modifications alone in the past 3-6 months. Pharmacotherapy is appropriate to pursue as augmentation. Will start Zepbound.   Confirmed patient not pregnant and no personal or family history of medullary thyroid carcinoma (MTC) or Multiple Endocrine Neoplasia syndrome type 2 (MEN 2).   Advised patient on common side effects including nausea, diarrhea, dyspepsia, decreased appetite, and fatigue. Counseled patient on reducing meal size and how to titrate medication to minimize side effects. Patient aware to call if intolerable side effects or if experiencing dehydration, abdominal pain, or dizziness. Patient will adhere to dietary modifications and will target at least 150 minutes of moderate intensity exercise weekly.   Injection technique reviewed at today's visit.    Titration Plan:  Will plan to follow the titration plan as below, pending patient is tolerating each dose before increasing to the next. Can slow titration if needed for tolerability.    -Month 1: Inject 2.5 mg SQ once weekly x 4 weeks -Month 2: Inject 5 mg SQ once weekly x 4 weeks -Month 3: Inject 7.5 mg SQ once weekly x 4 weeks -Month 4+: Inject 10 mg SQ once weekly   Follow up in 3 months.

## 2023-07-28 NOTE — Progress Notes (Deleted)
 [name] [address] [place] [address] Phone: [phone] Subjective:    I'm seeing this patient by the request  of:  Megan Broker, MD  CC: Back and neck pain follow-up  [id number]  [name] is a 48 y.o. female coming in with complaint of back and neck pain. OMT on 03/09/2023. Patient states   Medications patient has been prescribed:   Taking:         Reviewed prior external information including notes and imaging from previsou exam, outside providers and external EMR if available.   As well as notes that were available from care everywhere and other healthcare systems.  Past medical history, social, surgical and family history all reviewed in electronic medical record.  No pertanent information unless stated regarding to the chief complaint.   Past Medical History:  Diagnosis Date   Acute on chronic systolic (congestive) heart failure (HCC) 04/29/2017   Acute pain of right shoulder 06/16/2017   AICD (automatic cardioverter/defibrillator) present    Anginal pain (HCC)    Anxiety    Arthritis    right shoulder    Asthma    CHF (congestive heart failure) (HCC)    Chronic combined systolic and diastolic heart failure (HCC) 03/05/2014   Closed low lateral malleolus fracture 10/23/2013   Cystitis 10/21/2017   Depression    Depression with anxiety 01/20/2013   Diabetes mellitus without complication (HCC)    Diverticulosis    Dyspnea    comes and goes intermittently mostly with exertion    Dysrhythmia    Essential hypertension    Prev followed by [facility]/ Cardiology    Fibroid    age 60   Gallstones    Generalized abdominal cramping    History of cardiomyopathy    Hypertension    IBS (irritable bowel syndrome)    ICD (implantable cardioverter-defibrillator), single, 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 CPAP intolerance >> "smothering" Good tolerance of auto device (optimal pressure 12-13 on download).  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   Aspirin Other (See Comments)    Wheezing, (Pt states that she just wheezes some when she takes aspirin by itself but she can take aspirin in a combination product).   Contrast Media [Iodinated Contrast Media] Other (See Comments)    Multiple CT contrast studies done over 2 weeks caused ARF   Ciprofloxacin Itching and Rash   Cyclobenzaprine Other (See Comments)    Can not tolerate  Marcelline Deist [Dapagliflozin] Rash   Sulfa Antibiotics Itching and Rash     Review of Systems:  No headache, visual changes, nausea, vomiting, diarrhea, constipation, dizziness, abdominal pain, skin rash, fevers, chills, night sweats, weight loss, swollen lymph nodes, body aches, joint swelling, chest pain, shortness of breath, mood changes. POSITIVE muscle aches  Objective  There were no vitals taken for this visit.   General: No apparent distress alert and oriented x3 mood and affect normal, dressed appropriately.  HEENT: Pupils equal, extraocular movements intact   Respiratory: [name] speak in full sentences and does not appear short of breath  Cardiovascular: No lower extremity edema, non tender, no erythema  Gait MSK:  Back   Osteopathic findings  C2 flexed rotated and side bent right C6 flexed rotated and side bent left T3 extended rotated and side bent right inhaled rib T9 extended rotated and side bent left L2 flexed rotated and side bent right Sacrum right on right       Assessment and Plan:  No problem-specific Assessment & Plan notes found for this encounter.    Nonallopathic problems  Decision today to treat with OMT was based on Physical Exam  After verbal consent patient was treated with HVLA, ME, FPR techniques in cervical, rib, thoracic, lumbar, and sacral  areas  Patient tolerated the procedure well with improvement in symptoms  Patient given exercises, stretches and lifestyle modifications  See medications in patient instructions if given  Patient will follow up in 4-8 weeks             Note: This dictation was prepared with Dragon dictation along with smaller phrase technology. Any transcriptional errors that result from this process are unintentional.

## 2023-07-28 NOTE — Telephone Encounter (Signed)
-----   Message from Tonye Becket sent at 07/27/2023  3:31 PM EST ----- K low. Increase potassium to 40 meq twice a day. Please call.

## 2023-07-28 NOTE — Telephone Encounter (Signed)
 Please try PA for Zepbound with dx of OSA.

## 2023-07-28 NOTE — Progress Notes (Signed)
 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   Aspirin Other (See Comments)    Wheezing, (Pt states that she just wheezes some when she takes aspirin by itself but she can take aspirin in a combination product).   Contrast Media [Iodinated Contrast Media] Other (See Comments)    Multiple CT contrast studies done over 2 weeks caused ARF   Ciprofloxacin Itching and Rash   Cyclobenzaprine Other (See Comments)    Can not tolerate   Marcelline Deist [Dapagliflozin] Rash   Sulfa Antibiotics Itching and Rash    History of Present Illness    [name] is a 48 y.o. female patient of Dr Gala Romney, in the office today to discuss options for weight management.   She has reached out to her PCP previously this year to see if the medications would be covered, but was not successful.  She does have sleep apnea, unsure if that diagnosis was used in trying to get Zepbound covered.  She does not have any CVD that might qualify her for [facility].    Current weight management medications: none  Previously tried meds: none, did have gastric sleeve in 2019, weight was down to ~185, but has gained some back, down about 20 pounds from start at this time  Current meds that may affect weight:  none  Baseline weight/BMI: 215 lb // 35.84  Insurance payor:  [employer]  Diet: had gastric sleeve in 2019, grazes more htan eats meal;  protein shakes and plenty of fruit, nuts ; doesn't overeat; trying to control salt intake, plenty of vegetables;  spinach and kale  Exercise: limited, chasing nephew  Family History: no family history of heart disease  Confirmed patient not pregnant and no personal or family history of medullary thyroid carcinoma (MTC) or Multiple Endocrine Neoplasia syndrome type 2 (MEN 2).   Social History:   Tobacco: no  Alcohol: no  Caffeine:  occasional   Adherence Assessment  Do you ever forget to take your medication? [] Yes [x] No  Do you ever skip doses due to side effects? [] Yes [x] No  Do you have trouble affording your medicines? [] Yes [x] No  Are you ever unable to pick up your medication due to transportation difficulties? [] Yes [x] No   Adherence strategy: all meds in tote  Accessory Clinical Findings    Lab Results  Component Value Date   CREATININE 1.35 (H) 07/27/2023   BUN 25 (H) 07/27/2023   NA 135 07/27/2023   K 3.4 (L) 07/27/2023   CL 96 (L) 07/27/2023   CO2 27 07/27/2023   Lab Results  Component Value Date   ALT 30 01/21/2023   AST 38 01/21/2023   ALKPHOS 64 01/21/2023   BILITOT 1.1 01/21/2023   Lab Results  Component Value Date   HGBA1C 5.2 07/16/2023    Home Medications/Allergies    Current Outpatient Medications  Medication Sig Dispense Refill   acetaminophen (TYLENOL) 650 MG CR tablet Take 650 mg by mouth every  8 (eight) hours as needed for pain (headache).     albuterol (VENTOLIN HFA) 108 (90 Base) MCG/ACT inhaler Inhale 1-2 puffs into the lungs every 6 (six) hours as needed for wheezing or shortness of breath. 18 g 0   amiodarone (PACERONE) 200 MG tablet Take 1 tablet (200 mg total) by mouth daily. 90 tablet 3   budesonide-formoterol (SYMBICORT) 160-4.5 MCG/ACT inhaler Inhale 2 puffs into the lungs 2 (two) times daily as needed. 10 g 3   dicyclomine (BENTYL) 20 MG tablet Take 1 tablet (20 mg total) by mouth daily as needed for spasms. 90 tablet 3   fluticasone (FLONASE) 50 MCG/ACT nasal spray Place 2 sprays into both nostrils daily as needed for allergies. 16 g 3    gabapentin (NEURONTIN) 300 MG capsule Take 1 capsule (300 mg total) by mouth at bedtime as needed (back spasms). 90 capsule 3   ipratropium-albuterol (DUONEB) 0.5-2.5 (3) MG/3ML SOLN Take 3 mLs by nebulization every 6 (six) hours as needed. 360 mL 1   ivabradine (CORLANOR) 7.5 MG TABS tablet Take 1 tablet (7.5 mg total) by mouth 2 (two) times daily with a meal. 60 tablet 11   JARDIANCE 25 MG TABS tablet TAKE 1 TABLET BY MOUTH ONCE DAILY BEFORE BREAKFAST 90 tablet 0   Magnesium Oxide 200 MG TABS Take 1 tablet (200 mg total) by mouth daily. 90 tablet 3   metolazone (ZAROXOLYN) 2.5 MG tablet TAKE 1 TABLET BY MOUTH AS DIRECTED 5 tablet 1   metoprolol succinate (TOPROL-XL) 100 MG 24 hr tablet Take 1 tablet (100 mg total) by mouth daily. Take with or immediately following a meal. 90 tablet 3   mexiletine (MEXITIL) 150 MG capsule Take 2 capsules (300 mg total) by mouth 2 (two) times daily. 120 capsule 11   Multiple Vitamins-Minerals (MULTIVITAMIN GUMMIES WOMENS PO) Take 2 tablets by mouth daily.     naproxen sodium (ALEVE) 220 MG tablet Take 220-440 mg by mouth 2 (two) times daily as needed (Pain).     nitroGLYCERIN (NITROSTAT) 0.4 MG SL tablet Place 1 tablet (0.4 mg total) under the tongue every 5 (five) minutes as needed for chest pain. 75 tablet 0   potassium chloride SA (KLOR-CON M) 20 MEQ tablet Take 2 tablets (40 mEq total) by mouth daily. 90 tablet 3   sacubitril-valsartan (ENTRESTO) 97-103 MG Take 1 tablet by mouth twice daily 180 tablet 3   spironolactone (ALDACTONE) 25 MG tablet Take 1 tablet (25 mg total) by mouth at bedtime. 90 tablet 3   tizanidine (ZANAFLEX) 2 MG capsule Take 2 mg by mouth as needed for muscle spasms.     torsemide (DEMADEX) 20 MG tablet Take 2 tablets (40 mg total) by mouth 2 (two) times daily. 360 tablet 2   Ubrogepant (UBRELVY) 100 MG TABS Take 1 tablet (100 mg total) by mouth daily as needed (migraine). 30 tablet 3   Vitamin D, Ergocalciferol, (DRISDOL) 1.25 MG (50000  UNIT) CAPS capsule Take 1 capsule by mouth once a week 12 capsule 0   No current facility-administered medications for this visit.     Allergies  Allergen Reactions   Vancomycin Other (See Comments)    "did something to my kidneys," PROGRESSED TO KIDNEY FAILURE!!   Aspirin Other (See Comments)    Wheezing, (Pt states that she just wheezes some when she takes aspirin by itself but she can take aspirin in a combination product).   Contrast Media [Iodinated Contrast Media] Other (See Comments)    Multiple CT  contrast studies done over 2 weeks caused ARF   Ciprofloxacin Itching and Rash   Cyclobenzaprine Other (See Comments)    Can not tolerate   Marcelline Deist [Dapagliflozin] Rash   Sulfa Antibiotics Itching and Rash    Assessment & Plan    OSA (obstructive sleep apnea) Patient with diagnosis of OSA.  Patient has not met goal of at least 5% of body weight loss with comprehensive lifestyle modifications alone in the past 3-6 months. Pharmacotherapy is appropriate to pursue as augmentation. Will start Zepbound.   Confirmed patient not pregnant and no personal or family history of medullary thyroid carcinoma (MTC) or Multiple Endocrine Neoplasia syndrome type 2 (MEN 2).   Advised patient on common side effects including nausea, diarrhea, dyspepsia, decreased appetite, and fatigue. Counseled patient on reducing meal size and how to titrate medication to minimize side effects. Patient aware to call if intolerable side effects or if experiencing dehydration, abdominal pain, or dizziness. Patient will adhere to dietary modifications and will target at least 150 minutes of moderate intensity exercise weekly.   Injection technique reviewed at today's visit.    Titration Plan:  Will plan to follow the titration plan as below, pending patient is tolerating each dose before increasing to the next. Can slow titration if needed for tolerability.    -Month 1: Inject 2.5 mg SQ once weekly x 4 weeks -Month  2: Inject 5 mg SQ once weekly x 4 weeks -Month 3: Inject 7.5 mg SQ once weekly x 4 weeks -Month 4+: Inject 10 mg SQ once weekly   Follow up in 3 months.   Phillips Hay PharmD CPP [facility]  [address] [place], [place] [address] [phone]

## 2023-07-29 NOTE — Telephone Encounter (Signed)
 Can we do an appeal.   Technically we are using the medication for OSA and Zepbound is the only FDA approved drug for this disorder.   So there are no formulary alternatives to try.  (Do I need to write a letter?)

## 2023-07-29 NOTE — Telephone Encounter (Signed)
 Pharmacy Patient Advocate Encounter  Received notification from [facility] [employer] that Prior Authorization for zepbound has been DENIED.  Full denial letter will be uploaded to the media tab. See denial reason below.   PA #/Case ID/Reference #: [id number]

## 2023-07-30 ENCOUNTER — Encounter (HOSPITAL_COMMUNITY): Payer: PPO

## 2023-07-30 ENCOUNTER — Ambulatory Visit: Payer: PPO | Admitting: Family Medicine

## 2023-08-03 ENCOUNTER — Encounter: Payer: Self-pay | Admitting: Family Medicine

## 2023-08-03 NOTE — Addendum Note (Signed)
 Addended by: Elease Etienne A on: 08/03/2023 09:24 AM   Modules accepted: Orders

## 2023-08-03 NOTE — Progress Notes (Signed)
 Remote ICD transmission.

## 2023-08-05 ENCOUNTER — Telehealth (HOSPITAL_COMMUNITY): Payer: Self-pay

## 2023-08-05 NOTE — Telephone Encounter (Signed)
 Called and left patient a voice message  to confirm/remind patient of their [facility] on [facility].   And to bring in all medications and/or complete list.

## 2023-08-06 ENCOUNTER — Encounter (HOSPITAL_COMMUNITY): Payer: Self-pay

## 2023-08-06 ENCOUNTER — Ambulatory Visit (HOSPITAL_COMMUNITY)
Admission: RE | Admit: 2023-08-06 | Discharge: 2023-08-06 | Disposition: A | Discharge status: Home / Self Care | Payer: PPO | Source: Ambulatory Visit | Attending: Family Medicine | Admitting: Family Medicine

## 2023-08-06 VITALS — BP 126/80 | HR 105 | Ht 65.0 in | Wt 215.4 lb

## 2023-08-06 DIAGNOSIS — I493 Ventricular premature depolarization: Secondary | ICD-10-CM | POA: Insufficient documentation

## 2023-08-06 DIAGNOSIS — Z7984 Long term (current) use of oral hypoglycemic drugs: Secondary | ICD-10-CM | POA: Diagnosis not present

## 2023-08-06 DIAGNOSIS — G4733 Obstructive sleep apnea (adult) (pediatric): Secondary | ICD-10-CM | POA: Diagnosis not present

## 2023-08-06 DIAGNOSIS — I5022 Chronic systolic (congestive) heart failure: Secondary | ICD-10-CM | POA: Insufficient documentation

## 2023-08-06 DIAGNOSIS — Z95810 Presence of automatic (implantable) cardiac defibrillator: Secondary | ICD-10-CM | POA: Insufficient documentation

## 2023-08-06 DIAGNOSIS — R682 Dry mouth, unspecified: Secondary | ICD-10-CM | POA: Insufficient documentation

## 2023-08-06 DIAGNOSIS — R008 Other abnormalities of heart beat: Secondary | ICD-10-CM | POA: Diagnosis not present

## 2023-08-06 DIAGNOSIS — Z6835 Body mass index (BMI) 35.0-35.9, adult: Secondary | ICD-10-CM | POA: Insufficient documentation

## 2023-08-06 DIAGNOSIS — R5382 Chronic fatigue, unspecified: Secondary | ICD-10-CM | POA: Diagnosis not present

## 2023-08-06 DIAGNOSIS — Z9884 Bariatric surgery status: Secondary | ICD-10-CM | POA: Insufficient documentation

## 2023-08-06 DIAGNOSIS — Z79899 Other long term (current) drug therapy: Secondary | ICD-10-CM | POA: Insufficient documentation

## 2023-08-06 DIAGNOSIS — E119 Type 2 diabetes mellitus without complications: Secondary | ICD-10-CM | POA: Diagnosis not present

## 2023-08-06 DIAGNOSIS — I110 Hypertensive heart disease with heart failure: Secondary | ICD-10-CM | POA: Diagnosis not present

## 2023-08-06 LAB — BASIC METABOLIC PANEL
Anion gap: 12 (ref 5–15)
BUN: 19 mg/dL (ref 6–20)
CO2: 25 mmol/L (ref 22–32)
Calcium: 9.3 mg/dL (ref 8.9–10.3)
Chloride: 98 mmol/L (ref 98–111)
Creatinine, Ser: 1.01 mg/dL — ABNORMAL HIGH (ref 0.44–1.00)
GFR, Estimated: >60 mL/min (ref >60)
Glucose, Bld: 84 mg/dL (ref 70–99)
Potassium: 4.2 mmol/L (ref 3.5–5.1)
Sodium: 135 mmol/L (ref 135–145)

## 2023-08-06 LAB — MAGNESIUM: Magnesium: 2.2 mg/dL (ref 1.7–2.4)

## 2023-08-06 MED ORDER — TORSEMIDE 20 MG PO TABS: 80.0000 mg | ORAL_TABLET | Freq: Two times a day (BID) | ORAL | 6 refills | Status: DC

## 2023-08-06 MED ORDER — POTASSIUM CHLORIDE CRYS ER 20 MEQ PO TBCR: 60.0000 meq | EXTENDED_RELEASE_TABLET | Freq: Two times a day (BID) | ORAL | 6 refills | Status: DC

## 2023-08-06 MED ORDER — DIGOXIN 125 MCG PO TABS: 0.1250 mg | ORAL_TABLET | Freq: Every day | ORAL | 6 refills | Status: DC

## 2023-08-06 NOTE — Telephone Encounter (Signed)
 Her most recent sleep study (2021) showed no apnea, so have nothing to justify to insurer.  Will let patient know this was denied.  Thanks for your help.

## 2023-08-06 NOTE — Progress Notes (Signed)
 [facility] Note   PCP: Myrlene Broker, MD Primary Cardiologist: Lesleigh Noe, MD (Inactive)  HF Cardiologist: Dr. 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   Admitted 7/19 with CP Symptoms thought to be related to frequent PVCs. EP consulted and scheduled her for PVC ablation. Echo repeated 11/22/17 and showed improved EF 50-55%. S/p  PVC ablation 12/01/17. Was felt not to be successful.  Was on flecainide but discontinued due to reduced EF. Placed on amio   Echo 6/20 showed drop in EF back down to 25-30%. Was instructed to f/u in clinic but pt did not. She had outpatient monitor 04/2018 that showed rare PVCs.    Admitted 6/21 with chest pain and shortness of breath.ECHO EF < 20% and normal RV. Had cath with normal cors and preserved cardiac output. Plan was to f/u with EP regarding PVCs. She has remained on AAD therapy w/ amiodarone + ? blocker therapy.  Admitted to [facility] 4/22 w/ increased dyspnea and volume overload. Also w/ increased PVC burden. Echo showed EF 20-25%, RV normal. She was diuresed w/ IV Lasix and placed on amiodarone gtt for PVC suppression. Seen by Dr. Ladona Ridgel, due to the location of her PVCs, she is not a candidate for re-do ablation.   S/p Barostim 6/22.  Zio 8/22: 13% PVCs (unifocal)  Seen by EP 12/22 and Barostim titrated. Not felt to have any other options for PVCs.   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  -> mexilitene increased to 300 bid  Cardiac PET 6/24 no ischemia/infarction. No active myocardial inflammation/ evidence of sarcoidosis. 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 Overall feeling tired. Having dry mouth. Has no energy. Has required metolazone with extra 40 KCL about 3x/week. Weight swings 6-9 lbs w/o metolazone. Denies palpitations, abnormal bleeding, CP, dizziness, edema, or PND/Orthopnea. Appetite ok. No fever or chills. Weight at home 206 pounds. Taking all medications. Pharmacy working on [employer] approval.   Cardiac Studies   - Cardiac PET (6/24):    FDG uptake was not observed. LV perfusion is normal. There is no evidence of ischemia. There is no evidence of infarction.   Coronary calcium was absent on the attenuation correction CT images.   FDG uptake findings are inconsistent with active myocardial inflammation/sarcoidosis.   No gated images or EF due to interference from AICD leads  - Echo (7/23): EF 25-30%  - Echo (1/23): EF 25-30% RV normal   - Echo (4/22): EF 20-25%, RV normal   - PFTs w/ DLOC (4/22) Mild Restriction Normal Diffusion  - CPX (7/21) FVC 2.32 (70%)      FEV1 1.99 (74%)        FEV1/FVC 86 (105%)        MVV 110 (105%) Resting HR: 94 Peak HR: 175   (100% age predicted max HR) BP rest: 126/82 BP peak: 202/78 Peak VO2: 17.6 (86% predicted peak VO2) - corrects to 28.4 for ibw VE/VCO2 slope:  37 OUES: 1.74 Peak RER: 1.08 Ventilatory Threshold: 14.1 (69% predicted  or measured peak VO2) VE/MVV:  53% PETCO2 at peak:   O2pulse:  10   (83% predicted O2pulse)  - RHC (6/21):   RA = 4 RV = 32/6 PA = 31/8 (21) PCW = 12 Fick cardiac output/index = 7.0/3.4 Thermo CO/CI = 5.2/2.5 PVR =1.7 (Thermo) Ao sat = 98% PA sat = 67%, 69%  - Echo (6/21): EF < 20% RV normal   - Echo (6/20): EF 25-30%  - Echo (7/19): EF 50-55%  - CPX (12/18): pVO2 14.0 (corrects to 23.0 for IBW) VeVCO2  32 RER 1.0  - Echo (8/18): EF 25%   - CPX (2/18): FVC 2.57 (79%)      FEV1 2.14 (81%)        FEV1/FVC 83 (101%)        MVV 107 (102%) Resting HR: 106 Peak HR: 166    (93% age predicted max HR) BP rest: 136/98 BP peak: 174/88 Peak VO2: 17.1 (85% predicted peak VO2) - corrects to 28.4 for ibw VE/VCO2 slope:  33 OUES: 2.16 Peak RER: 1.09 Ventilatory Threshold: 14.6 (73% predicted or measured peak VO2) VE/MVV:  59% PETCO2 at peak:  33 O2pulse:  10   (83% predicted O2pulse)  - Echo (12/17) 20-25%  - Cath (6/17): revealed normal vessels, moderate elevation in pulmonary artery pressures, and LVEF 25-30%.  - Echo (6/17): 25-30%  - Echo (6/15): EF 35-40%   Review of systems complete and found to be negative unless listed in HPI.    Past Medical History:  Diagnosis Date   Acute on chronic systolic (congestive) heart failure (HCC) 04/29/2017   Acute pain of right shoulder 06/16/2017   AICD (automatic cardioverter/defibrillator) present    Anginal pain (HCC)    Anxiety    Arthritis    right shoulder    Asthma    CHF (congestive heart failure) (HCC)    Chronic combined systolic and diastolic heart failure (HCC) 03/05/2014   Closed low lateral malleolus fracture 10/23/2013   Cystitis 10/21/2017   Depression    Depression with anxiety 01/20/2013   Diabetes mellitus without complication (HCC)    Diverticulosis    Dyspnea    comes and goes intermittently mostly with exertion    Dysrhythmia    Essential hypertension    Prev followed by [name]    Fibroid    age 60   Gallstones    Generalized abdominal cramping    History of cardiomyopathy    Hypertension    IBS (irritable bowel syndrome)    ICD (implantable cardioverter-defibrillator), single, 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 CPAP intolerance >> "smothering" Good tolerance of auto device (optimal pressure 12-13 on download).  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     albuterol (VENTOLIN HFA) 108 (90 Base) MCG/ACT inhaler Inhale 1-2 puffs into the lungs every 6 (six) hours as needed for wheezing or shortness of breath. 18 g 0   amiodarone (PACERONE) 200 MG tablet Take 1 tablet (200 mg total) by mouth daily. 90 tablet 3   budesonide-formoterol (SYMBICORT) 160-4.5 MCG/ACT inhaler Inhale 2 puffs into the lungs 2 (two) times daily as needed. 10 g 3   dicyclomine (BENTYL) 20 MG tablet Take 1 tablet (20 mg total) by mouth daily as needed for spasms. 90 tablet 3   fluticasone (FLONASE) 50 MCG/ACT nasal spray Place 2 sprays into both nostrils daily as needed for allergies. 16 g 3   gabapentin (NEURONTIN) 300 MG capsule Take 1 capsule (300 mg total) by mouth at bedtime as needed (back spasms). 90 capsule 3   ipratropium-albuterol (DUONEB) 0.5-2.5 (3) MG/3ML SOLN Take 3 mLs by nebulization every 6 (six) hours as needed. 360 mL 1   ivabradine (CORLANOR) 7.5 MG TABS tablet Take 1 tablet (7.5 mg total) by mouth 2 (two) times daily with a meal. 60 tablet 11   JARDIANCE 25 MG TABS tablet TAKE 1 TABLET BY MOUTH ONCE DAILY BEFORE BREAKFAST 90 tablet 0   Magnesium Oxide 200 MG TABS Take 1 tablet (200 mg total) by mouth daily. 90 tablet 3    metolazone (ZAROXOLYN) 2.5 MG tablet TAKE 1 TABLET BY MOUTH AS DIRECTED 5 tablet 1   metoprolol succinate (TOPROL-XL) 100 MG 24 hr tablet Take 1 tablet (100 mg total) by mouth daily. Take with or immediately following a meal. 90 tablet 3   mexiletine (MEXITIL) 150 MG capsule Take 2 capsules (300 mg total) by mouth 2 (two) times daily. 120 capsule 11   Multiple Vitamins-Minerals (MULTIVITAMIN GUMMIES WOMENS PO) Take 2 tablets by mouth daily.     naproxen sodium (ALEVE) 220 MG tablet Take 220-440 mg by mouth 2 (two) times daily as needed (Pain).     nitroGLYCERIN (NITROSTAT) 0.4 MG SL tablet Place 1 tablet (0.4 mg total) under the tongue every 5 (five) minutes as needed for chest pain. 75 tablet 0   potassium chloride SA (KLOR-CON M) 20 MEQ tablet Take 2 tablets (40 mEq total) by mouth 2 (two) times daily.     sacubitril-valsartan (ENTRESTO) 97-103 MG Take 1 tablet by mouth twice daily 180 tablet 3   spironolactone (ALDACTONE) 25 MG tablet Take 1 tablet (25 mg total) by mouth at bedtime. 90 tablet 3   tizanidine (ZANAFLEX) 2 MG capsule Take 2 mg by mouth as needed for muscle spasms.     torsemide (DEMADEX) 20 MG tablet Take 2 tablets (40 mg total) by mouth 2 (two) times daily. 360 tablet 2   Ubrogepant (UBRELVY) 100 MG TABS Take 1 tablet (100 mg total) by mouth daily as needed (migraine). 30 tablet 3   Vitamin D, Ergocalciferol, (DRISDOL) 1.25 MG (50000 UNIT) CAPS capsule Take 1 capsule by mouth once a week 12 capsule 0   No current facility-administered medications for this encounter.   Allergies  Allergen Reactions   Vancomycin Other (See Comments)    "did something to my kidneys," PROGRESSED TO KIDNEY FAILURE!!   Aspirin Other (See Comments)    Wheezing, (Pt states that she  just wheezes some when she takes aspirin by itself but she can take aspirin in a combination product).   Contrast Media [Iodinated Contrast Media] Other (See Comments)    Multiple CT contrast studies done over 2 weeks  caused ARF   Ciprofloxacin Itching and Rash   Cyclobenzaprine Other (See Comments)    Can not tolerate   Farxiga [Dapagliflozin] Rash   Sulfa Antibiotics Itching and Rash   Social History   Socioeconomic History   Marital status: Married    Spouse name: Reita Cliche   Number of children: 1   Years of education: Not on file   Highest education level: Associate degree: academic program  Occupational History   Occupation: stay at home mom  Tobacco Use   Smoking status: Never   Smokeless tobacco: Never  Vaping Use   Vaping status: Never Used  Substance and Sexual Activity   Alcohol use: No   Drug use: No   Sexual activity: Yes    Birth control/protection: Surgical, Pill    Comment: BTL  Other Topics Concern   Not on file  Social History Narrative   Lives with husband, daughter niece and great nephew   Social Drivers of Corporate investment banker Strain: Low Risk  (06/08/2023)   Overall Financial Resource Strain (CARDIA)    Difficulty of Paying Living Expenses: Not very hard  Food Insecurity: No Food Insecurity (06/08/2023)   Hunger Vital Sign    Worried About Running Out of Food in the Last Year: Never true    Ran Out of Food in the Last Year: Never true  Transportation Needs: Unmet Transportation Needs (06/08/2023)   PRAPARE - Administrator, Civil Service (Medical): Yes    Lack of Transportation (Non-Medical): No  Physical Activity: Insufficiently Active (06/08/2023)   Exercise Vital Sign    Days of Exercise per Week: 1 day    Minutes of Exercise per Session: 10 min  Stress: Stress Concern Present (06/08/2023)   [employer] of Occupational Health - Occupational Stress Questionnaire    Feeling of Stress : Very much  Social Connections: Socially Isolated (06/08/2023)   Social Connection and Isolation Panel [NHANES]    Frequency of Communication with Friends and Family: Never    Frequency of Social Gatherings with Friends and Family: Never    Attends Religious  Services: Never    Database administrator or Organizations: No    Attends Banker Meetings: Never    Marital Status: Married  Catering manager Violence: Not At Risk (05/06/2023)   Humiliation, Afraid, Rape, and Kick questionnaire    Fear of Current or Ex-Partner: No    Emotionally Abused: No    Physically Abused: No    Sexually Abused: No   Family History  Problem Relation Age of Onset   Emphysema Maternal Grandmother        smoked   Heart disease Maternal Grandmother 21       MI   Rheum arthritis Mother    Allergies Daughter    Colon cancer Neg Hx    BP 126/80   Pulse (!) 105   Ht 5\' 5"  (1.651 m)   Wt 97.7 kg (215 lb 6.4 oz)   SpO2 98%   BMI 35.84 kg/m   Wt Readings from Last 3 Encounters:  08/06/23 97.7 kg (215 lb 6.4 oz)  07/28/23 97.7 kg (215 lb 6.4 oz)  07/23/23 95.5 kg (210 lb 9.6 oz)   PHYSICAL EXAM: General:  NAD.  No resp difficulty, walked into clinic HEENT: Normal Neck: Supple. No JVD. Cor: Regular rate & rhythm. No rubs, gallops or murmurs. Lungs: Clear Abdomen: Soft, obese, nontender, nondistended.  Extremities: No cyanosis, clubbing, rash, edema Neuro: Alert & oriented x 3, moves all 4 extremities w/o difficulty. Affect pleasant.  Device interrogation (personally reviewed from 07/31/33): HL Score 0, thoracic impedence looks ok, average HR 89 bpm, 0.7 hr/day activity, no VT  ASSESSMENT & PLAN:  1. Chronic systolic HF:   - due to peri-partum CM, onset 1999.  - S/P [employer] ICD 07/2016 - s/p Barostim 6/22 - LHC 6/17 No CAD - Echo 12/17 EF 20-25% - Echo 8/18 EF 25-30% s/p ICD 3/18.  - CPX 12/18 showed moderate limitation due mostly to obesity with some HF component - Echo 12/19 EF 20-25%. RV ok  - Echo 6/21 EF < 20%. - Echo 7/23 EF 25-30% - Recent Zio results worrisome for LMNA CM. 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 Suspect progressive course. Has been referred to Dr. Joseph>>Variant is  interpreted as variant of unknown clinical significance. Per Dr. Conan Bowens, as there are no affected family members to determine if this variant segregates with disease, genetic testing her daughter for this familial TTN variant is unwarranted  - Cardiac PET 6/24: no ischemia/infarction. No active myocardial inflammation/ evidence of sarcoidosis - Remains NYHA III.  Volume labile, but looks good today on exam and by HL. - Will re-trial digoxin 0.125 mg daily.  - Will maximize loop in effort to reduce metolazone requirement. Increase torsemide to 80 mg daily, increase KCL to 60 bid. - We discussed limiting metolazone/extra 40 KCL use to 1-2x/week - Continue Jardiance 25 mg daily. - Continue Entesto 97/103 mg bid. - Continue spiro 25 mg qhs. - Continue Toprol XL 100 mg daily  - Continue Corlanor 7.5 mg bid. - Unclear if PVCs are a result of her CM or contributing to it (or both). Failed previous PVC ablation. C/w high burden despite 2 AAD therapy regimen w/ amiodarone and mexiletine.  - Suspect she may need advanced therapies in future. - Update echo, needs CPX soon. - Labs today. Needs dig trough and BMET in 7-10 days.  2. Frequent PVCs/ Bigeminy - Holter Monitor 6/19 with 30% PVCs with one primary morphology.  - S/p PVC ablation 11/2017 with Dr Ladona Ridgel unsuccessful - Off flecainide with EF down and persistent PVCs.  - Zio Patch 04/2019 > 17% PVCs and >8% couplets.  - Zio Patch 11/28/2019 8.5 % PVCs - Zio 9/22 13% PVCs (unifocal)  - Per Dr. Ladona Ridgel, due to the location of her PVCs, she is not a candidate for re-do ablation. Could consider referral to tertiary center - Zio 14 day (12/23) showed frequent NSVT, 13% PVC burden (see discussion above) - Cardiac PET 6/24 negative for sarcoid  - Continue amiodarone 200 mg daily. - Continue mexiletine 300 mg bid.  - Amio labs reviewed from 01/21/23 and are stable - Due for EP follow up - update echo  3. Morbid obesity - s/p gastric Sleeve at [facility] 09/20/17  - Body mass index is 35.84 kg/m. - [facility]erred to [facility]  - She is agreeable to GLP1RA, pharmD working on PA   4. Daytime Fatigue - Sleep study in 2019 and again in 2021 were both negative for sleep apnea  - No change.  5. DM 2 - On Jardiance - Per PCP - No change  Update echo.  Follow up in 3 months with Dr. Gala Romney.   [name] Ganong, FNP  2:10 PM

## 2023-08-06 NOTE — Patient Instructions (Addendum)
 Thank you for coming in today  If you had labs drawn today, any labs that are abnormal the clinic will call you No news is good news  Medications: Increase Torsemide 80 mg twice daily Increase Potassium 60 meq twice daily Start Digoxin 0.125 mg daily  Follow up appointments: Your physician recommends that you return for lab work in: 7-10 days BMET dig trough ( please hold Digoxin this morning)  Your physician recommends that you schedule a follow-up appointment in:  3 months With Dr. Gala Romney  Your physician has requested that you have an echocardiogram. Echocardiography is a painless test that uses sound waves to create images of your heart. It provides your doctor with information about the size and shape of your heart and how well your heart's chambers and valves are working. This procedure takes approximately one hour. There are no restrictions for this procedure.      Do the following things EVERYDAY: Weigh yourself in the morning before breakfast. Write it down and keep it in a log. Take your medicines as prescribed Eat low salt foods--Limit salt (sodium) to 2000 mg per day.  Stay as active as you can everyday Limit all fluids for the day to less than 2 liters   At the [facility], you and your health needs are our priority. As part of our continuing mission to provide you with exceptional heart care, we have created designated Provider Care Teams. These Care Teams include your primary Cardiologist (physician) and Advanced Practice Providers (APPs- Physician Assistants and Nurse Practitioners) who all work together to provide you with the care you need, when you need it.   You may see any of the following providers on your designated Care Team at your next follow up: Dr Arvilla Meres Dr Marca Ancona Dr. Marcos Eke, NP Robbie Lis, [facility] [place], [place] Brynda Peon, NP Karle Plumber, PharmD   Please be sure to  bring in all your medications bottles to every appointment.    Thank you for choosing [facility]  If you have any questions or concerns before your next appointment please send [place] a message through [place] or call our office at [phone].    TO LEAVE A MESSAGE FOR THE NURSE SELECT OPTION 2, PLEASE LEAVE A MESSAGE INCLUDING: YOUR NAME DATE OF BIRTH CALL BACK NUMBER REASON FOR CALL**this is important as we prioritize the call backs  YOU WILL RECEIVE A CALL BACK THE SAME DAY AS LONG AS YOU CALL BEFORE 4:00 PM

## 2023-08-09 IMAGING — DX DG CHEST 2V
2 series · 2 of 2 positions shown · non-contrast
Comparison: 08/25/2020

CLINICAL DATA: Chest pain.

EXAM:
CHEST - 2 VIEW

[chest pa]
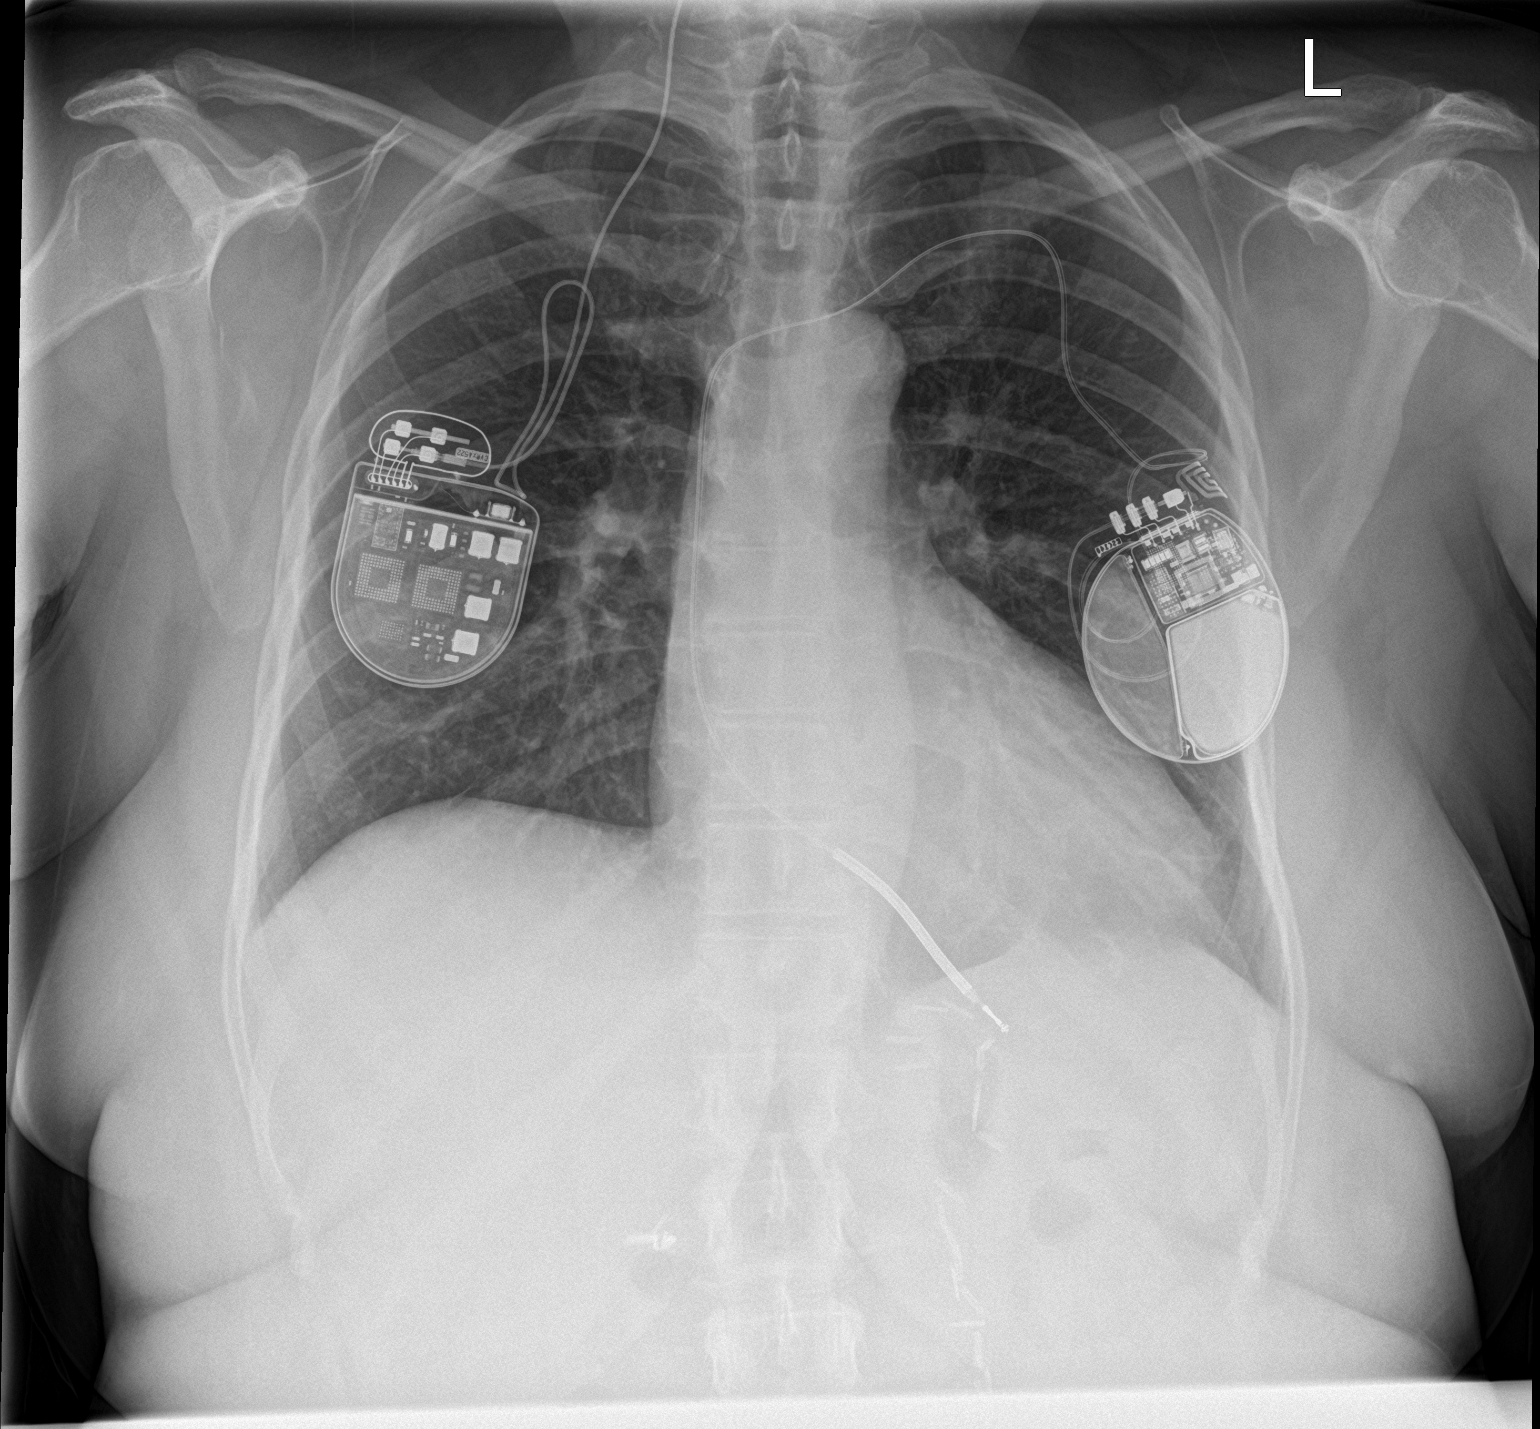

[chest lat]
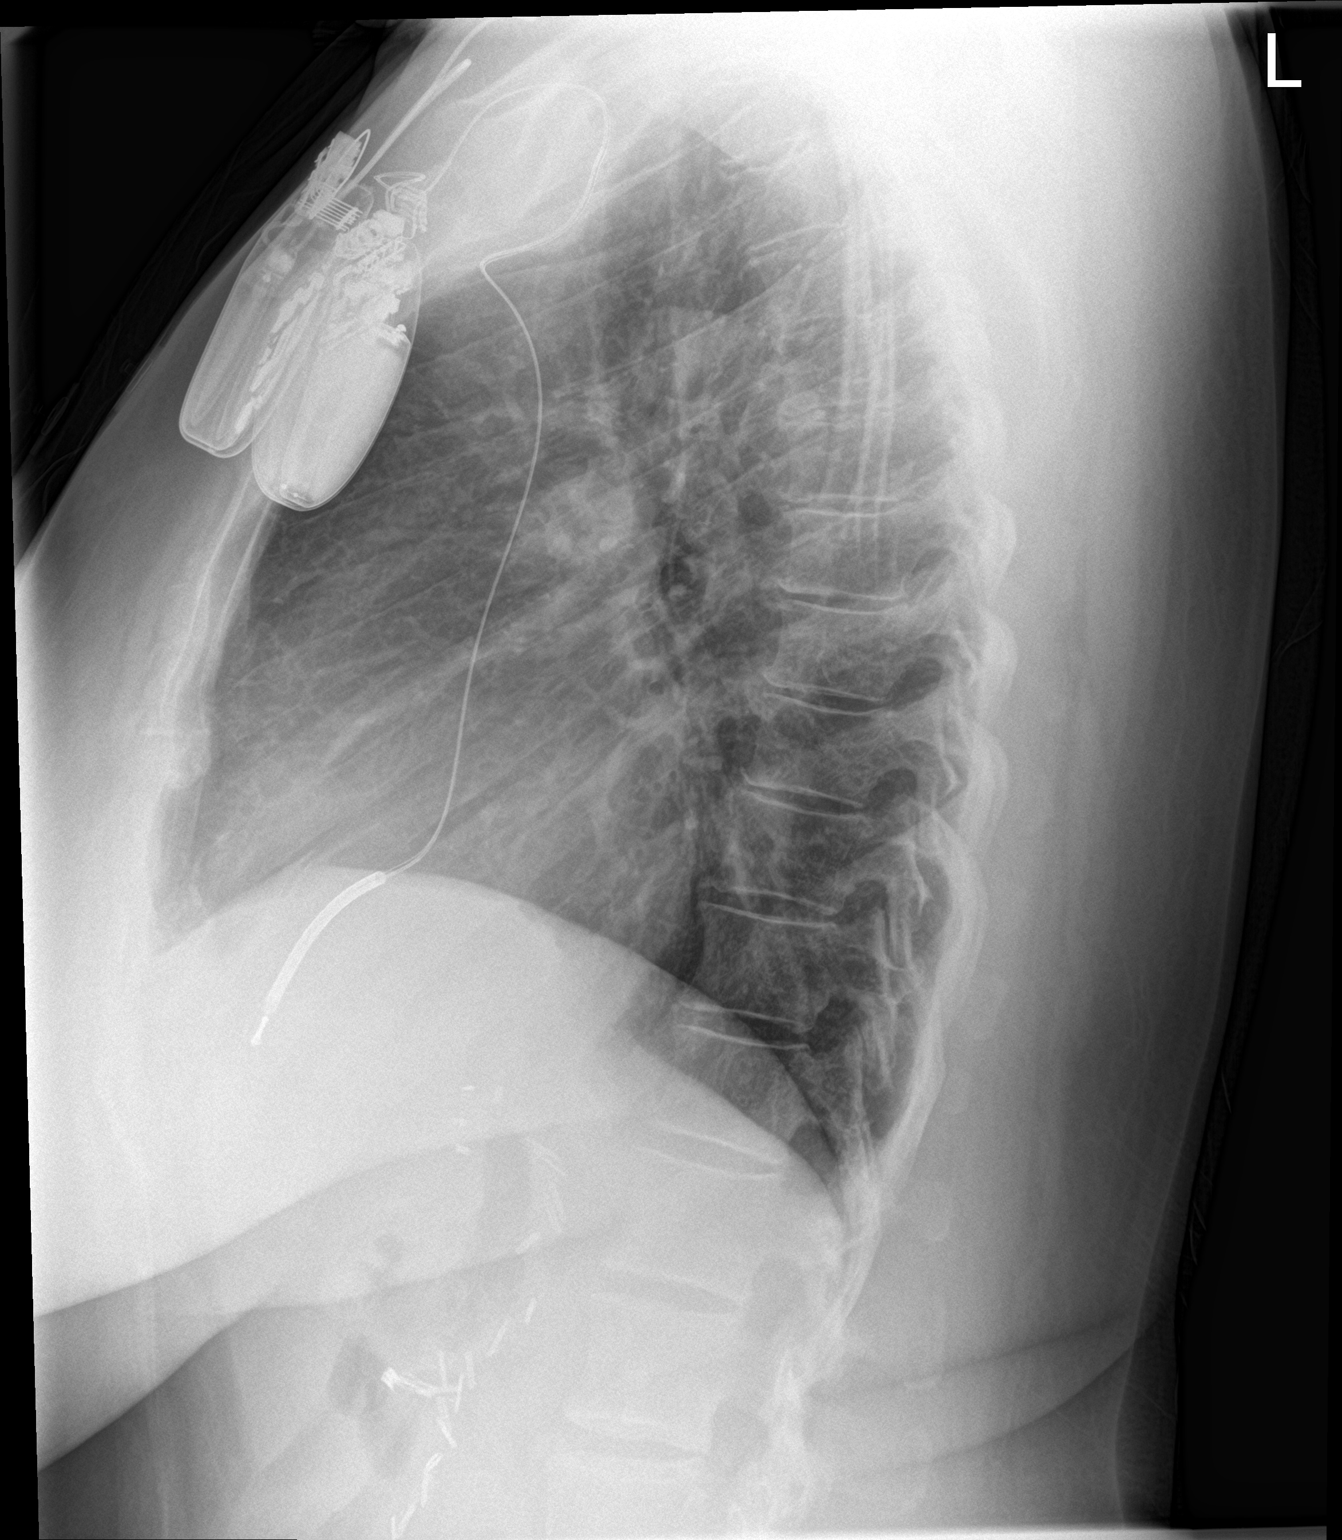

[2 of 2 positions shown; findings below may reference images not displayed]

FINDINGS: Left-sided pacemaker in place with leads projecting over the right
ventricle. The heart is upper normal in size with normal mediastinal
contours. Right-sided battery pack with lead tip coursing into the
neck. No focal airspace disease, pulmonary edema, pleural effusion,
or pneumothorax. No acute osseous abnormalities are seen.
IMPRESSION: No acute chest findings.

## 2023-08-10 NOTE — Telephone Encounter (Signed)
 Dr Graciela Husbands has contacted Dr Berneice Gandy directly regarding referral for ablation.

## 2023-08-15 ENCOUNTER — Other Ambulatory Visit: Payer: Self-pay

## 2023-08-15 ENCOUNTER — Ambulatory Visit
Admission: RE | Admit: 2023-08-15 | Discharge: 2023-08-15 | Disposition: A | Discharge status: Home / Self Care | Source: Ambulatory Visit | Attending: Internal Medicine | Admitting: Internal Medicine

## 2023-08-15 VITALS — BP 127/85 | HR 95 | Temp 97.9°F | Resp 18

## 2023-08-15 DIAGNOSIS — T23221A Burn of second degree of single right finger (nail) except thumb, initial encounter: Secondary | ICD-10-CM

## 2023-08-15 MED ORDER — TETANUS-DIPHTH-ACELL PERTUSSIS 5-2.5-18.5 LF-MCG/0.5 IM SUSY
0.5000 mL | PREFILLED_SYRINGE | Freq: Once | INTRAMUSCULAR | Status: AC
  Administered 2023-08-15: 0.5 mL via INTRAMUSCULAR

## 2023-08-15 NOTE — ED Provider Notes (Signed)
 [facility]    CSN: [id number] Arrival date & time: 08/15/23  1156      History   Chief Complaint Chief Complaint  Patient presents with   Burn    HPI [name] is a 48 y.o. female.   Patient presents with burn to right index finger that occurred yesterday.  Reports that she was making candy apples when the sugar got on her finger.  She did wash it off.  Last tetanus vaccine was in 2014.  She is concerned for infection given redness and increased pain.  Taking ibuprofen for pain.   Burn   Past Medical History:  Diagnosis Date   Acute on chronic systolic (congestive) heart failure (HCC) 04/29/2017   Acute pain of right shoulder 06/16/2017   AICD (automatic cardioverter/defibrillator) present    Anginal pain (HCC)    Anxiety    Arthritis    right shoulder    Asthma    CHF (congestive heart failure) (HCC)    Chronic combined systolic and diastolic heart failure (HCC) 03/05/2014   Closed low lateral malleolus fracture 10/23/2013   Cystitis 10/21/2017   Depression    Depression with anxiety 01/20/2013   Diabetes mellitus without complication (HCC)    Diverticulosis    Dyspnea    comes and goes intermittently mostly with exertion    Dysrhythmia    Essential hypertension    Prev followed by [name]    Fibroid    age 21   Gallstones    Generalized abdominal cramping    History of cardiomyopathy    Hypertension    IBS (irritable bowel syndrome)    ICD (implantable cardioverter-defibrillator), single, 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 CPAP intolerance >> "smothering" Good tolerance of auto device (optimal pressure 12-13 on download).  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   PVC (premature ventricular contraction) 06/23/2016   Seizures (HCC)    "as a child" (08/03/2016)   Termination of pregnancy    due to cardiac risk    Patient Active Problem List   Diagnosis Date Noted   Vitamin D deficiency 06/11/2023   Impaired fasting blood sugar 06/11/2023   Cervical disc disorder with radiculopathy of cervical region 03/09/2023   Uterine leiomyoma 10/28/2022   Fibroid 10/28/2022   Fibroids 10/28/2022   Encounter for general adult medical examination with abnormal findings 05/04/2022   GERD (gastroesophageal reflux disease) 03/10/2022   Right wrist pain 04/11/2021   Morbid obesity (HCC) 02/28/2021   Greater trochanteric bursitis of right hip 12/26/2020   Congestive heart failure, NYHA class III (HCC) 11/08/2020   Asthma 08/25/2020   Iron deficiency anemia due to chronic blood loss 08/06/2020   Greater trochanteric bursitis, left 07/01/2020   V-tach (HCC) 11/12/2019   Headache 10/26/2018   Chronic right-sided low back pain  without sciatica 06/24/2018   Patellar subluxation, left, initial encounter 04/25/2018   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   Diabetes mellitus without complication (HCC) 06/02/2017   Insomnia 05/07/2017   ICD (implantable cardioverter-defibrillator), single, 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   Patellofemoral syndrome of both knees 10/16/2016   NICM (nonischemic cardiomyopathy) (HCC) 08/03/2016   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   Labral tear of shoulder 04/04/2015   Chronic combined systolic and diastolic heart failure (HCC) 03/05/2014   IBS (irritable bowel syndrome)    MDD (major  depressive disorder) 01/20/2013   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   OSA (obstructive sleep apnea) 01/02/2013   Essential hypertension     Past Surgical History:  Procedure Laterality Date   BREAST BIOPSY Right 09/30/2022   [place] RT BREAST BX W LOC DEV 1ST LESION IMG BX SPEC US GUIDE 09/30/2022 [facility]   CARDIAC CATHETERIZATION N/A 11/11/2015   Procedure: Right/Left Heart Cath and Coronary Angiography;  Surgeon: Lennette Bihari, MD;  Location: [facility];  Service: Cardiovascular;  Laterality: N/A;   CARDIAC CATHETERIZATION  ~ 2015   CARDIAC DEFIBRILLATOR PLACEMENT  08/03/2016   CESAREAN SECTION  1999   COLONOSCOPY WITH PROPOFOL N/A 04/21/2016   Procedure: COLONOSCOPY WITH PROPOFOL;  Surgeon: Beverley Fiedler, MD;  Location: [facility];  Service: Gastroenterology;  Laterality: N/A;   ESOPHAGOGASTRODUODENOSCOPY (EGD) WITH PROPOFOL N/A 04/21/2016   Procedure: ESOPHAGOGASTRODUODENOSCOPY (EGD) WITH PROPOFOL;  Surgeon: Beverley Fiedler, MD;  Location: [facility];  Service: Gastroenterology;  Laterality: N/A;   FOOT FRACTURE SURGERY Right ~ 2003   FRACTURE SURGERY     ICD IMPLANT N/A 08/03/2016   Procedure: ICD Implant;  Surgeon: Duke Salvia, MD;  [facility] LAB;  Service: Cardiovascular;  Laterality: N/A;   IR ANGIOGRAM PELVIS SELECTIVE OR SUPRASELECTIVE  10/28/2022   IR ANGIOGRAM SELECTIVE EACH ADDITIONAL VESSEL  10/28/2022   IR ANGIOGRAM SELECTIVE EACH ADDITIONAL VESSEL  10/28/2022   IR ANGIOGRAM SELECTIVE EACH ADDITIONAL VESSEL  10/28/2022   IR EMBO TUMOR ORGAN ISCHEMIA INFARCT INC GUIDE ROADMAPPING  10/28/2022   IR US GUIDE VASC ACCESS LEFT  10/28/2022   IR US GUIDE VASC ACCESS RIGHT  10/28/2022   LAPAROSCOPIC CHOLECYSTECTOMY  12/2006   LAPAROSCOPIC GASTRIC SLEEVE RESECTION     LAPAROSCOPY ABDOMEN DIAGNOSTIC  2008   "cut bile duct w/gallbladder OR; had to go in later & fix leak; hospitalized for 2 months"   LEFT HEART CATHETERIZATION WITH CORONARY ANGIOGRAM N/A 02/26/2014    Procedure: LEFT HEART CATHETERIZATION WITH CORONARY ANGIOGRAM;  Surgeon: Corky Crafts, MD;  Location: [facility] CATH LAB;  Service: Cardiovascular;  Laterality: N/A;   OVARIAN CYST REMOVAL Right 1999   PVC ABLATION N/A 12/01/2017   Procedure: PVC ABLATION;  Surgeon: Marinus Maw, MD;  Location: [facility];  Service: Cardiovascular;  Laterality: N/A;   RIGHT HEART CATH N/A 08/05/2017   Procedure: RIGHT HEART CATH;  Surgeon: Dolores Patty, MD;  Location: [facility];  Service: Cardiovascular;  Laterality: N/A;   RIGHT HEART CATH N/A 11/16/2019   Procedure: RIGHT HEART CATH;  Surgeon: Dolores Patty, MD;  Location: [facility];  Service: Cardiovascular;  Laterality: N/A;   TUBAL LIGATION  1999    OB History     Gravida  2   Para  1   Term      Preterm      AB  1   Living  1      SAB  IAB  1   Ectopic      Multiple      Live Births               Home Medications    Prior to Admission medications   Medication Sig Start Date End Date Taking? Authorizing Provider  acetaminophen (TYLENOL) 650 MG CR tablet Take 650 mg by mouth every 8 (eight) hours as needed for pain (headache).    [provider]  albuterol (VENTOLIN HFA) 108 (90 Base) MCG/ACT inhaler Inhale 1-2 puffs into the lungs every 6 (six) hours as needed for wheezing or shortness of breath. 01/07/23   Myrlene Broker, MD  amiodarone (PACERONE) 200 MG tablet Take 1 tablet (200 mg total) by mouth daily. 02/04/21   Graciella Freer, PA-C  budesonide-formoterol (SYMBICORT) 160-4.5 MCG/ACT inhaler Inhale 2 puffs into the lungs 2 (two) times daily as needed. 03/13/22   Myrlene Broker, MD  dicyclomine (BENTYL) 20 MG tablet Take 1 tablet (20 mg total) by mouth daily as needed for spasms. 05/30/20   Myrlene Broker, MD  digoxin (LANOXIN) 0.125 MG tablet Take 1 tablet (0.125 mg total) by mouth daily. 08/06/23   Milford, Anderson Malta, FNP  fluticasone  (FLONASE) 50 MCG/ACT nasal spray Place 2 sprays into both nostrils daily as needed for allergies. 03/13/22   Myrlene Broker, MD  gabapentin (NEURONTIN) 300 MG capsule Take 1 capsule (300 mg total) by mouth at bedtime as needed (back spasms). 05/30/20   Myrlene Broker, MD  ipratropium-albuterol (DUONEB) 0.5-2.5 (3) MG/3ML SOLN Take 3 mLs by nebulization every 6 (six) hours as needed. 07/07/23   Myrlene Broker, MD  ivabradine (CORLANOR) 7.5 MG TABS tablet Take 1 tablet (7.5 mg total) by mouth 2 (two) times daily with a meal. 06/01/23   Bensimhon, Bevelyn Buckles, MD  JARDIANCE 25 MG TABS tablet TAKE 1 TABLET BY MOUTH ONCE DAILY BEFORE BREAKFAST 05/31/23   Myrlene Broker, MD  Magnesium Oxide 200 MG TABS Take 1 tablet (200 mg total) by mouth daily. 05/30/20   Bensimhon, Bevelyn Buckles, MD  metolazone (ZAROXOLYN) 2.5 MG tablet TAKE 1 TABLET BY MOUTH AS DIRECTED 06/01/23   Bensimhon, Bevelyn Buckles, MD  metoprolol succinate (TOPROL-XL) 100 MG 24 hr tablet Take 1 tablet (100 mg total) by mouth daily. Take with or immediately following a meal. 07/31/22   Bensimhon, Bevelyn Buckles, MD  mexiletine (MEXITIL) 150 MG capsule Take 2 capsules (300 mg total) by mouth 2 (two) times daily. 06/01/23   Bensimhon, Bevelyn Buckles, MD  Multiple Vitamins-Minerals (MULTIVITAMIN GUMMIES WOMENS PO) Take 2 tablets by mouth daily.    [provider]  naproxen sodium (ALEVE) 220 MG tablet Take 220-440 mg by mouth 2 (two) times daily as needed (Pain).    [provider]  nitroGLYCERIN (NITROSTAT) 0.4 MG SL tablet Place 1 tablet (0.4 mg total) under the tongue every 5 (five) minutes as needed for chest pain. 06/20/21   Bensimhon, Bevelyn Buckles, MD  potassium chloride SA (KLOR-CON M) 20 MEQ tablet Take 3 tablets (60 mEq total) by mouth 2 (two) times daily. 08/06/23   Milford, Anderson Malta, FNP  sacubitril-valsartan (ENTRESTO) 97-103 MG Take 1 tablet by mouth twice daily 08/31/22   Bensimhon, Bevelyn Buckles, MD  spironolactone (ALDACTONE) 25 MG tablet  Take 1 tablet (25 mg total) by mouth at bedtime. 06/15/23   Bensimhon, Bevelyn Buckles, MD  tizanidine (ZANAFLEX) 2 MG capsule Take 2 mg by mouth as needed for muscle spasms.  [provider]  torsemide (DEMADEX) 20 MG tablet Take 4 tablets (80 mg total) by mouth 2 (two) times daily. 08/06/23 11/04/23  [name] Ganong, FNP  Ubrogepant (UBRELVY) 100 MG TABS Take 1 tablet (100 mg total) by mouth daily as needed (migraine). 01/07/23   Myrlene Broker, MD  Vitamin D, Ergocalciferol, (DRISDOL) 1.25 MG (50000 UNIT) CAPS capsule Take 1 capsule by mouth once a week 05/31/23   Myrlene Broker, MD    Family History Family History  Problem Relation Age of Onset   Emphysema Maternal Grandmother        smoked   Heart disease Maternal Grandmother 64       MI   Rheum arthritis Mother    Allergies Daughter    Colon cancer Neg Hx     Social History Social History   Tobacco Use   Smoking status: Never   Smokeless tobacco: Never  Vaping Use   Vaping status: Never Used  Substance Use Topics   Alcohol use: No   Drug use: No     Allergies   Vancomycin, Aspirin, Contrast media [iodinated contrast media], Ciprofloxacin, Cyclobenzaprine, Farxiga [dapagliflozin], and Sulfa antibiotics   Review of Systems Review of Systems Per HPI  Physical Exam Triage Vital Signs ED Triage Vitals  Encounter Vitals Group     BP 08/15/23 1221 127/85     Systolic BP Percentile --      Diastolic BP Percentile --      Pulse Rate 08/15/23 1221 95     Resp 08/15/23 1221 18     Temp 08/15/23 1221 97.9 F (36.6 C)     Temp Source 08/15/23 1221 Oral     SpO2 08/15/23 1221 97 %     Weight --      Height --      Head Circumference --      Peak Flow --      Pain Score 08/15/23 1218 7     Pain Loc --      Pain Education --      Exclude from Growth Chart --    No data found.  Updated Vital Signs BP 127/85 (BP Location: Left Arm)   Pulse 95   Temp 97.9 F (36.6 C) (Oral)   Resp 18   SpO2 97%    Visual Acuity Right Eye Distance:   Left Eye Distance:   Bilateral Distance:    Right Eye Near:   Left Eye Near:    Bilateral Near:     Physical Exam Constitutional:      General: She is not in acute distress.    Appearance: Normal appearance. She is not toxic-appearing or diaphoretic.  HENT:     Head: Normocephalic and atraumatic.  Eyes:     Extraocular Movements: Extraocular movements intact.     Conjunctiva/sclera: Conjunctivae normal.  Pulmonary:     Effort: Pulmonary effort is normal.  Skin:    Comments: Patient has approximately 2.5 cm x 1 cm in diameter intact blister present to medial portion of right index finger at the proximal finger.  Surrounding redness noted with no significant swelling.  Patient has range of motion of finger.  Capillary refill and pulses intact.  Remainder of hand is unaffected.  It does not spread to joint.  Neurological:     General: No focal deficit present.     Mental Status: She is alert and oriented to person, place, and time. Mental status is at baseline.  Psychiatric:  Mood and Affect: Mood normal.        Behavior: Behavior normal.        Thought Content: Thought content normal.        Judgment: Judgment normal.      UC Treatments / Results  Labs (all labs ordered are listed, but only abnormal results are displayed) Labs Reviewed - No data to display  EKG   Radiology No results found.  Procedures Procedures (including critical care time)  Medications Ordered in UC Medications  Tdap (BOOSTRIX) injection 0.5 mL (0.5 mLs Intramuscular Given 08/15/23 1254)    Initial Impression / Assessment and Plan / UC Course  I have reviewed the triage vital signs and the nursing notes.  Pertinent labs & imaging results that were available during my care of the patient were reviewed by me and considered in my medical decision making (see chart for details).     Patient has partial-thickness burn with intact blister present to  right index finger.  No signs of complication or infection.  Will leave blister intact to prevent infection.  Patient has sulfa allergy so will defer Silvadene.  Nonadherent dressing with topical antibiotic ointment placed by clinical staff in urgent care today.  Advised patient of wound care and monitoring for signs of infection and complication.  Tetanus vaccine updated today.  Patient verbalized understanding and was agreeable with plan. Final Clinical Impressions(s) / UC Diagnoses   Final diagnoses:  Partial thickness burn of finger of right hand, initial encounter     Discharge Instructions      Tetanus vaccine updated today.  Please monitor for signs of infection that include increased redness, swelling, pus, fever and follow-up if this occurs.  You may follow-up with your PCP as well.    ED Prescriptions   None    PDMP not reviewed this encounter.   Gustavus Bryant, [place] 08/15/23 [phone]

## 2023-08-15 NOTE — ED Triage Notes (Addendum)
 Looks infected and hurts more than before - Entered by patient  Pt reports she was making candy apples and hot sugar got on her right index finger/hand. States burn blistered.   Intact firm blister noted to back of right index fingers

## 2023-08-15 NOTE — Discharge Instructions (Signed)
 Tetanus vaccine updated today.  Please monitor for signs of infection that include increased redness, swelling, pus, fever and follow-up if this occurs.  You may follow-up with your PCP as well.

## 2023-08-16 ENCOUNTER — Ambulatory Visit (HOSPITAL_COMMUNITY)
Admission: RE | Admit: 2023-08-16 | Discharge: 2023-08-16 | Disposition: A | Discharge status: Home / Self Care | Source: Ambulatory Visit | Attending: Cardiology | Admitting: Cardiology

## 2023-08-16 DIAGNOSIS — I5022 Chronic systolic (congestive) heart failure: Secondary | ICD-10-CM | POA: Insufficient documentation

## 2023-08-16 LAB — BASIC METABOLIC PANEL
Anion gap: 10 (ref 5–15)
BUN: 10 mg/dL (ref 6–20)
CO2: 20 mmol/L — ABNORMAL LOW (ref 22–32)
Calcium: 9.1 mg/dL (ref 8.9–10.3)
Chloride: 110 mmol/L (ref 98–111)
Creatinine, Ser: 0.83 mg/dL (ref 0.44–1.00)
GFR, Estimated: >60 mL/min (ref >60)
Glucose, Bld: 91 mg/dL (ref 70–99)
Potassium: 4.5 mmol/L (ref 3.5–5.1)
Sodium: 140 mmol/L (ref 135–145)

## 2023-08-17 ENCOUNTER — Telehealth (HOSPITAL_COMMUNITY): Payer: Self-pay

## 2023-08-17 DIAGNOSIS — I5042 Chronic combined systolic (congestive) and diastolic (congestive) heart failure: Secondary | ICD-10-CM

## 2023-08-17 NOTE — Telephone Encounter (Signed)
 Per KB, pt's echo referral has been placed.

## 2023-08-17 NOTE — Progress Notes (Deleted)
 GYNECOLOGY  VISIT   HPI: 48 y.o.   Married  [place] female   [phone] with No LMP recorded. Patient has had an ablation.   here for: sharp pains     GYNECOLOGIC HISTORY: No LMP recorded. Patient has had an ablation. Contraception:  ablation Menopausal hormone therapy:  n/a Last 2 paps:  05/06/22 neg, 08/01/10 neg History of abnormal Pap or positive HPV:  no Mammogram:  09/17/22 Breast Density Cat C, BI-RADS CAT 0 incomplete        OB History     Gravida  2   Para  1   Term      Preterm      AB  1   Living  1      SAB      IAB  1   Ectopic      Multiple      Live Births                 Patient Active Problem List   Diagnosis Date Noted   Vitamin D deficiency 06/11/2023   Impaired fasting blood sugar 06/11/2023   Cervical disc disorder with radiculopathy of cervical region 03/09/2023   Uterine leiomyoma 10/28/2022   Fibroid 10/28/2022   Fibroids 10/28/2022   Encounter for general adult medical examination with abnormal findings 05/04/2022   GERD (gastroesophageal reflux disease) 03/10/2022   Right wrist pain 04/11/2021   Morbid obesity (HCC) 02/28/2021   Greater trochanteric bursitis of right hip 12/26/2020   Congestive heart failure, NYHA class III (HCC) 11/08/2020   Asthma 08/25/2020   Iron deficiency anemia due to chronic blood loss 08/06/2020   Greater trochanteric bursitis, left 07/01/2020   V-tach (HCC) 11/12/2019   Headache 10/26/2018   Chronic right-sided low back pain without sciatica 06/24/2018   Patellar subluxation, left, initial encounter 04/25/2018   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   Diabetes mellitus without complication (HCC) 06/02/2017   Insomnia 05/07/2017   ICD (implantable cardioverter-defibrillator), single, 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   Patellofemoral syndrome of both knees 10/16/2016   NICM (nonischemic cardiomyopathy) (HCC) 08/03/2016   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   Labral tear of shoulder 04/04/2015    Chronic combined systolic and diastolic heart failure (HCC) 03/05/2014   IBS (irritable bowel syndrome)    MDD (major depressive disorder) 01/20/2013   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   OSA (obstructive sleep apnea) 01/02/2013   Essential hypertension     Past Medical History:  Diagnosis Date   Acute on chronic systolic (congestive) heart failure (HCC) 04/29/2017   Acute pain of right shoulder 06/16/2017   AICD (automatic cardioverter/defibrillator) present    Anginal pain (HCC)    Anxiety    Arthritis    right shoulder    Asthma    CHF (congestive heart failure) (HCC)    Chronic combined systolic and diastolic heart failure (HCC) 03/05/2014   Closed low lateral malleolus fracture 10/23/2013   Cystitis 10/21/2017   Depression    Depression with anxiety 01/20/2013   Diabetes mellitus without complication (HCC)    Diverticulosis    Dyspnea    comes and goes intermittently mostly with exertion    Dysrhythmia    Essential hypertension    Prev followed by [name]    Fibroid    age 31   Gallstones    Generalized abdominal cramping    History of cardiomyopathy    Hypertension    IBS (irritable bowel syndrome)    ICD (implantable cardioverter-defibrillator), single,  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 CPAP intolerance >> "smothering" Good tolerance of auto device (optimal pressure 12-13 on download).  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   PVC (premature ventricular contraction) 06/23/2016   Seizures (HCC)    "as a child" (08/03/2016)   Termination of pregnancy    due to cardiac risk    Past Surgical History:  Procedure Laterality Date   BREAST BIOPSY Right 09/30/2022   [place] RT BREAST BX W LOC DEV 1ST LESION IMG BX SPEC US GUIDE 09/30/2022 [facility]   CARDIAC CATHETERIZATION N/A 11/11/2015   Procedure: Right/Left Heart Cath and Coronary Angiography;  Surgeon: Lennette Bihari, MD;  Location: [facility];  Service: Cardiovascular;  Laterality: N/A;   CARDIAC CATHETERIZATION  ~ 2015   CARDIAC DEFIBRILLATOR PLACEMENT  08/03/2016   CESAREAN SECTION  1999   COLONOSCOPY WITH PROPOFOL N/A 04/21/2016   Procedure: COLONOSCOPY WITH PROPOFOL;  Surgeon: Beverley Fiedler, MD;  Location: [facility];  Service: Gastroenterology;  Laterality: N/A;   ESOPHAGOGASTRODUODENOSCOPY (EGD) WITH PROPOFOL N/A 04/21/2016   Procedure: ESOPHAGOGASTRODUODENOSCOPY (EGD) WITH PROPOFOL;  Surgeon: Beverley Fiedler, MD;  Location: [facility];  Service: Gastroenterology;  Laterality: N/A;   FOOT FRACTURE SURGERY Right ~ 2003   FRACTURE SURGERY     ICD IMPLANT N/A 08/03/2016   Procedure: ICD Implant;  Surgeon: Duke Salvia, MD;  Location: [facility] INVASIVE CV LAB;  Service: Cardiovascular;  Laterality: N/A;   IR ANGIOGRAM PELVIS SELECTIVE OR SUPRASELECTIVE  10/28/2022   IR ANGIOGRAM SELECTIVE EACH ADDITIONAL VESSEL  10/28/2022   IR ANGIOGRAM SELECTIVE EACH ADDITIONAL VESSEL  10/28/2022   IR ANGIOGRAM SELECTIVE EACH ADDITIONAL VESSEL  10/28/2022   IR EMBO TUMOR ORGAN ISCHEMIA INFARCT INC GUIDE ROADMAPPING  10/28/2022   IR US GUIDE VASC ACCESS LEFT  10/28/2022   IR US GUIDE VASC ACCESS RIGHT  10/28/2022   LAPAROSCOPIC CHOLECYSTECTOMY  12/2006   LAPAROSCOPIC GASTRIC SLEEVE RESECTION     LAPAROSCOPY ABDOMEN DIAGNOSTIC  2008   "cut bile duct w/gallbladder OR; had  to go in later & fix leak; hospitalized for 2 months"   LEFT HEART CATHETERIZATION WITH CORONARY ANGIOGRAM N/A 02/26/2014   Procedure: LEFT HEART CATHETERIZATION WITH CORONARY ANGIOGRAM;  Surgeon: Corky Crafts, MD;  Location: [facility] CATH LAB;  Service: Cardiovascular;  Laterality: N/A;   OVARIAN CYST REMOVAL Right 1999   PVC ABLATION N/A 12/01/2017   Procedure: PVC ABLATION;  Surgeon: Marinus Maw, MD;  Location: [facility];  Service: Cardiovascular;  Laterality: N/A;   RIGHT HEART CATH N/A 08/05/2017   Procedure: RIGHT HEART CATH;  Surgeon: Dolores Patty, MD;  Location: [facility];  Service: Cardiovascular;  Laterality: N/A;   RIGHT HEART CATH N/A 11/16/2019   Procedure: RIGHT HEART CATH;  Surgeon: Dolores Patty,  MD;  Location: [facility];  Service: Cardiovascular;  Laterality: N/A;   TUBAL LIGATION  1999    Current Outpatient Medications  Medication Sig Dispense Refill   acetaminophen (TYLENOL) 650 MG CR tablet Take 650 mg by mouth every 8 (eight) hours as needed for pain (headache).     albuterol (VENTOLIN HFA) 108 (90 Base) MCG/ACT inhaler Inhale 1-2 puffs into the lungs every 6 (six) hours as needed for wheezing or shortness of breath. 18 g 0   amiodarone (PACERONE) 200 MG tablet Take 1 tablet (200 mg total) by mouth daily. 90 tablet 3   budesonide-formoterol (SYMBICORT) 160-4.5 MCG/ACT inhaler Inhale 2 puffs into the lungs 2 (two) times daily as needed. 10 g 3   dicyclomine (BENTYL) 20 MG tablet Take 1 tablet (20 mg total) by mouth daily as needed for spasms. 90 tablet 3   digoxin (LANOXIN) 0.125 MG tablet Take 1 tablet (0.125 mg total) by mouth daily. 30 tablet 6   fluticasone (FLONASE) 50 MCG/ACT nasal spray Place 2 sprays into both nostrils daily as needed for allergies. 16 g 3   gabapentin (NEURONTIN) 300 MG capsule Take 1 capsule (300 mg total) by mouth at bedtime as needed (back spasms). 90 capsule 3   ipratropium-albuterol (DUONEB) 0.5-2.5 (3)  MG/3ML SOLN Take 3 mLs by nebulization every 6 (six) hours as needed. 360 mL 1   ivabradine (CORLANOR) 7.5 MG TABS tablet Take 1 tablet (7.5 mg total) by mouth 2 (two) times daily with a meal. 60 tablet 11   JARDIANCE 25 MG TABS tablet TAKE 1 TABLET BY MOUTH ONCE DAILY BEFORE BREAKFAST 90 tablet 0   Magnesium Oxide 200 MG TABS Take 1 tablet (200 mg total) by mouth daily. 90 tablet 3   metolazone (ZAROXOLYN) 2.5 MG tablet TAKE 1 TABLET BY MOUTH AS DIRECTED 5 tablet 1   metoprolol succinate (TOPROL-XL) 100 MG 24 hr tablet Take 1 tablet (100 mg total) by mouth daily. Take with or immediately following a meal. 90 tablet 3   mexiletine (MEXITIL) 150 MG capsule Take 2 capsules (300 mg total) by mouth 2 (two) times daily. 120 capsule 11   Multiple Vitamins-Minerals (MULTIVITAMIN GUMMIES WOMENS PO) Take 2 tablets by mouth daily.     naproxen sodium (ALEVE) 220 MG tablet Take 220-440 mg by mouth 2 (two) times daily as needed (Pain).     nitroGLYCERIN (NITROSTAT) 0.4 MG SL tablet Place 1 tablet (0.4 mg total) under the tongue every 5 (five) minutes as needed for chest pain. 75 tablet 0   potassium chloride SA (KLOR-CON M) 20 MEQ tablet Take 3 tablets (60 mEq total) by mouth 2 (two) times daily. 180 tablet 6   sacubitril-valsartan (ENTRESTO) 97-103 MG Take 1 tablet by mouth twice daily 180 tablet 3   spironolactone (ALDACTONE) 25 MG tablet Take 1 tablet (25 mg total) by mouth at bedtime. 90 tablet 3   tizanidine (ZANAFLEX) 2 MG capsule Take 2 mg by mouth as needed for muscle spasms.     torsemide (DEMADEX) 20 MG tablet Take 4 tablets (80 mg total) by mouth 2 (two) times daily. 240 tablet 6   Ubrogepant (UBRELVY) 100 MG TABS Take 1 tablet (100 mg total) by mouth daily as needed (migraine). 30 tablet 3   Vitamin D, Ergocalciferol, (DRISDOL) 1.25 MG (50000 UNIT) CAPS capsule Take 1 capsule by mouth once a week 12 capsule 0   No current facility-administered medications for this visit.     ALLERGIES:  Vancomycin, Aspirin,  Contrast media [iodinated contrast media], Ciprofloxacin, Cyclobenzaprine, Farxiga [dapagliflozin], and Sulfa antibiotics  Family History  Problem Relation Age of Onset   Emphysema Maternal Grandmother        smoked   Heart disease Maternal Grandmother 6       MI   Rheum arthritis Mother    Allergies Daughter    Colon cancer Neg Hx     Social History   Socioeconomic History   Marital status: Married    Spouse name: Reita Cliche   Number of children: 1   Years of education: Not on file   Highest education level: Associate degree: academic program  Occupational History   Occupation: stay at home mom  Tobacco Use   Smoking status: Never   Smokeless tobacco: Never  Vaping Use   Vaping status: Never Used  Substance and Sexual Activity   Alcohol use: No   Drug use: No   Sexual activity: Yes    Birth control/protection: Surgical, Pill    Comment: BTL  Other Topics Concern   Not on file  Social History Narrative   Lives with husband, daughter niece and great nephew   Social Drivers of Corporate investment banker Strain: Low Risk  (06/08/2023)   Overall Financial Resource Strain (CARDIA)    Difficulty of Paying Living Expenses: Not very hard  Food Insecurity: No Food Insecurity (06/08/2023)   Hunger Vital Sign    Worried About Running Out of Food in the Last Year: Never true    Ran Out of Food in the Last Year: Never true  Transportation Needs: Unmet Transportation Needs (06/08/2023)   PRAPARE - Administrator, Civil Service (Medical): Yes    Lack of Transportation (Non-Medical): No  Physical Activity: Insufficiently Active (06/08/2023)   Exercise Vital Sign    Days of Exercise per Week: 1 day    Minutes of Exercise per Session: 10 min  Stress: Stress Concern Present (06/08/2023)   [employer] of Occupational Health - Occupational Stress Questionnaire    Feeling of Stress : Very much  Social Connections: Socially Isolated (06/08/2023)    Social Connection and Isolation Panel [NHANES]    Frequency of Communication with Friends and Family: Never    Frequency of Social Gatherings with Friends and Family: Never    Attends Religious Services: Never    Database administrator or Organizations: No    Attends Banker Meetings: Never    Marital Status: Married  Catering manager Violence: Not At Risk (05/06/2023)   Humiliation, Afraid, Rape, and Kick questionnaire    Fear of Current or Ex-Partner: No    Emotionally Abused: No    Physically Abused: No    Sexually Abused: No    Review of Systems  PHYSICAL EXAMINATION:   There were no vitals taken for this visit.    General appearance: alert, cooperative and appears stated age Head: Normocephalic, without obvious abnormality, atraumatic Neck: no adenopathy, supple, symmetrical, trachea midline and thyroid normal to inspection and palpation Lungs: clear to auscultation bilaterally Breasts: normal appearance, no masses or tenderness, No nipple retraction or dimpling, No nipple discharge or bleeding, No axillary or supraclavicular adenopathy Heart: regular rate and rhythm Abdomen: soft, non-tender, no masses,  no organomegaly Extremities: extremities normal, atraumatic, no cyanosis or edema Skin: Skin color, texture, turgor normal. No rashes or lesions Lymph nodes: Cervical, supraclavicular, and axillary nodes normal. No abnormal inguinal nodes palpated Neurologic: Grossly normal  Pelvic: External genitalia:  no lesions  Urethra:  normal appearing urethra with no masses, tenderness or lesions              Bartholins and Skenes: normal                 Vagina: normal appearing vagina with normal color and discharge, no lesions              Cervix: no lesions                Bimanual Exam:  Uterus:  normal size, contour, position, consistency, mobility, non-tender              Adnexa: no mass, fullness, tenderness              Rectal exam: {yes no:314532}.   Confirms.              Anus:  normal sphincter tone, no lesions  Chaperone was present for exam:  {BSCHAPERONE:31226::"Andrell Bergeson F, CMA"}  ASSESSMENT:    PLAN:    {LABS (Optional):23779}  ***  total time was spent for this patient encounter, including preparation, face-to-face counseling with the patient, coordination of care, and documentation of the encounter.

## 2023-08-18 ENCOUNTER — Encounter: Payer: Self-pay | Admitting: Internal Medicine

## 2023-08-20 ENCOUNTER — Ambulatory Visit (HOSPITAL_COMMUNITY)
Admission: RE | Admit: 2023-08-20 | Discharge: 2023-08-20 | Disposition: A | Discharge status: Home / Self Care | Source: Ambulatory Visit | Attending: Internal Medicine | Admitting: Internal Medicine

## 2023-08-20 DIAGNOSIS — I5042 Chronic combined systolic (congestive) and diastolic (congestive) heart failure: Secondary | ICD-10-CM | POA: Diagnosis not present

## 2023-08-20 DIAGNOSIS — Z006 Encounter for examination for normal comparison and control in clinical research program: Secondary | ICD-10-CM

## 2023-08-20 NOTE — Progress Notes (Signed)
*  PRELIMINARY RESULTS* Echocardiogram 2D Echocardiogram has been performed.  [name] 08/20/2023, 2:55 PM

## 2023-08-20 NOTE — Research (Signed)
 SITE: [id number]     Subject # [id number]   Subprotocol: A  Inclusion Criteria  Patients who meet all of the following criteria are eligible for enrollment as study participants:  Yes No  Age > 50 years old X   Eligible to wear Holter Study X    Exclusion Criteria  Patients who meet any of these criteria are not eligible for enrollment as study participants: Yes No  1. Receiving any mechanical (respiratory or circulatory) or renal support therapy at Screening or during Visit #1.  X  2.  Any other conditions that in the opinion of the investigators are likely to prevent compliance with the study protocol or pose a safety concern if the subject participates in the study.  X  3. Poor tolerance, namely susceptible to severe skin allergies from ECG adhesive patch application.  X   Protocol: REV H                                     Residential Zip code [address] (First 3 digits ONLY)                                             [facility] Informed Consent   Subject Name: [name]  Subject met inclusion and exclusion criteria.  The informed consent form, study requirements and expectations were reviewed with the subject. Subject had opportunity to read consent and questions and concerns were addressed prior to the signing of the consent form.  The subject verbalized understanding of the trial requirements.  The subject agreed to participate in the [facility] EF ACT trial and signed the informed consent at 14:42 on 20-Aug-2023.  The informed consent was obtained prior to performance of any protocol-specific procedures for the subject.  A copy of the signed informed consent was given to the subject and a copy was placed in the subject's medical record.   Dyanne Iha          Current Outpatient Medications:    acetaminophen (TYLENOL) 650 MG CR tablet, Take 650 mg by mouth every 8 (eight) hours as needed for pain (headache)., Disp: , Rfl:    albuterol (VENTOLIN HFA) 108 (90 Base) MCG/ACT inhaler, Inhale  1-2 puffs into the lungs every 6 (six) hours as needed for wheezing or shortness of breath., Disp: 18 g, Rfl: 0   amiodarone (PACERONE) 200 MG tablet, Take 1 tablet (200 mg total) by mouth daily., Disp: 90 tablet, Rfl: 3   budesonide-formoterol (SYMBICORT) 160-4.5 MCG/ACT inhaler, Inhale 2 puffs into the lungs 2 (two) times daily as needed., Disp: 10 g, Rfl: 3   dicyclomine (BENTYL) 20 MG tablet, Take 1 tablet (20 mg total) by mouth daily as needed for spasms., Disp: 90 tablet, Rfl: 3   digoxin (LANOXIN) 0.125 MG tablet, Take 1 tablet (0.125 mg total) by mouth daily., Disp: 30 tablet, Rfl: 6   fluticasone (FLONASE) 50 MCG/ACT nasal spray, Place 2 sprays into both nostrils daily as needed for allergies., Disp: 16 g, Rfl: 3   gabapentin (NEURONTIN) 300 MG capsule, Take 1 capsule (300 mg total) by mouth at bedtime as needed (back spasms)., Disp: 90 capsule, Rfl: 3   ipratropium-albuterol (DUONEB) 0.5-2.5 (3) MG/3ML SOLN, Take 3 mLs by nebulization every 6 (six) hours as needed., Disp: 360 mL,  Rfl: 1   ivabradine (CORLANOR) 7.5 MG TABS tablet, Take 1 tablet (7.5 mg total) by mouth 2 (two) times daily with a meal., Disp: 60 tablet, Rfl: 11   JARDIANCE 25 MG TABS tablet, TAKE 1 TABLET BY MOUTH ONCE DAILY BEFORE BREAKFAST, Disp: 90 tablet, Rfl: 0   Magnesium Oxide 200 MG TABS, Take 1 tablet (200 mg total) by mouth daily., Disp: 90 tablet, Rfl: 3   metolazone (ZAROXOLYN) 2.5 MG tablet, TAKE 1 TABLET BY MOUTH AS DIRECTED, Disp: 5 tablet, Rfl: 1   metoprolol succinate (TOPROL-XL) 100 MG 24 hr tablet, Take 1 tablet (100 mg total) by mouth daily. Take with or immediately following a meal., Disp: 90 tablet, Rfl: 3   mexiletine (MEXITIL) 150 MG capsule, Take 2 capsules (300 mg total) by mouth 2 (two) times daily., Disp: 120 capsule, Rfl: 11   Multiple Vitamins-Minerals (MULTIVITAMIN GUMMIES WOMENS PO), Take 2 tablets by mouth daily., Disp: , Rfl:    naproxen sodium (ALEVE) 220 MG tablet, Take 220-440 mg by mouth 2  (two) times daily as needed (Pain)., Disp: , Rfl:    nitroGLYCERIN (NITROSTAT) 0.4 MG SL tablet, Place 1 tablet (0.4 mg total) under the tongue every 5 (five) minutes as needed for chest pain., Disp: 75 tablet, Rfl: 0   potassium chloride SA (KLOR-CON M) 20 MEQ tablet, Take 3 tablets (60 mEq total) by mouth 2 (two) times daily., Disp: 180 tablet, Rfl: 6   sacubitril-valsartan (ENTRESTO) 97-103 MG, Take 1 tablet by mouth twice daily, Disp: 180 tablet, Rfl: 3   spironolactone (ALDACTONE) 25 MG tablet, Take 1 tablet (25 mg total) by mouth at bedtime., Disp: 90 tablet, Rfl: 3   tizanidine (ZANAFLEX) 2 MG capsule, Take 2 mg by mouth as needed for muscle spasms., Disp: , Rfl:    torsemide (DEMADEX) 20 MG tablet, Take 4 tablets (80 mg total) by mouth 2 (two) times daily., Disp: 240 tablet, Rfl: 6   Ubrogepant (UBRELVY) 100 MG TABS, Take 1 tablet (100 mg total) by mouth daily as needed (migraine)., Disp: 30 tablet, Rfl: 3   Vitamin D, Ergocalciferol, (DRISDOL) 1.25 MG (50000 UNIT) CAPS capsule, Take 1 capsule by mouth once a week, Disp: 12 capsule, Rfl: 0

## 2023-08-23 ENCOUNTER — Ambulatory Visit: Attending: Internal Medicine

## 2023-08-23 ENCOUNTER — Other Ambulatory Visit: Payer: Self-pay | Admitting: Internal Medicine

## 2023-08-23 DIAGNOSIS — I5042 Chronic combined systolic (congestive) and diastolic (congestive) heart failure: Secondary | ICD-10-CM

## 2023-08-23 DIAGNOSIS — Z95810 Presence of automatic (implantable) cardiac defibrillator: Secondary | ICD-10-CM | POA: Diagnosis not present

## 2023-08-25 ENCOUNTER — Telehealth: Payer: Self-pay

## 2023-08-25 NOTE — Telephone Encounter (Signed)
 Pharmacy Patient Advocate Encounter   Received notification from CoverMyMeds that prior authorization for Ubrelvy 100MG  tablets  is due for renewal.   Insurance verification completed.   The patient is insured through [facility] ADVANTAGE/RX ADVANCE.  Action: PA required; PA started via CoverMyMeds. KEY [id number] . Waiting for clinical questions to populate.

## 2023-08-26 NOTE — Progress Notes (Signed)
 EPIC Encounter for ICM Monitoring  Patient Name: [name] is a 48 y.o. female Date: 08/26/2023 Primary Care Physican: Myrlene Broker, MD Primary Cardiologist: Bensimhon Electrophysiologist: Graciela Husbands 12/28/2022 Weight: 218 lbs       01/27/2023 Weight: 200 lbs     02/03/2023 Weight: 200 lbs 04/21/2023 Weight: 207 lbs (baseline 200-203) 06/01/2023 Office Weight: 214 lbs 07/21/2023 Weight: 206 lbs (baseline 200-203) 07/28/2023 Weight: 209 lbs 08/05/2023 Office Weight: 215 lbs   Attempted call to patient and unable to reach.  Left detailed message per DPR regarding transmission.  Transmission results reviewed.    Heartlogic HF Index 8 suggesting fluid levels are within normal threshold.     Prescribed: Torsemide 20 mg Take 4 tablets (80 mg total) by mouth twice a day Potassium 20 mEq take 3 tablet(s) (60 mEq total) by mouth twice a day Metolazone 2.5 mg take 1 tablet (2.5 mg total) by mouth as directed.  Per Prince Rome, NP 3/13 HF clinic OV discussed limiting metolazone/extra 40 KCL use to 1-2x/week  Spironolactone 25 mg take 1 tablet (25 mg total) at bedtime.    Labs: 08/16/2023 Creatinine 0.83, BUN 10, Potassium 4.5, Sodium 140, GFR >60 08/06/2023 Creatinine 1.01, BUN 19, Potassium 4.2, Sodium 135, GFR >60 07/27/2023 Creatinine 1.35, BUN 25, Potassium 3.4, Sodium 135, GFR 48 07/23/2023 Creatinine 1.07, BUN 22, Potassium 4.6, Sodium 138, GFR 64 07/16/2023 Creatinine 0.84, BUN 7,   Potassium 3.3, Sodium 137, GFR >60 06/21/2023 Creatinine 0.97, BUN 6,   Potassium 4.6, Sodium 137, GFR >60 06/01/2023 Creatinine 0.86, BUN 7,   Potassium 3.2, Sodium 138, GFR >60 A complete set of results can be found in Results Review.   Recommendations:   Left voice mail with ICM number and encouraged to call if experiencing any fluid symptoms.    Follow-up plan: ICM clinic phone appointment on 09/27/2023.  91 day device clinic remote transmission 09/22/2023.              EP/Cardiology next office  visit:   Recall 12/30/2023 with Dr Ladona Ridgel.   11/15/2023 with Dr Gala Romney.  08/27/2023 with Otilio Saber PA to adjust Barostim.  Last visit with Dr Graciela Husbands was 07/23/23.         Copy of ICM check sent to Dr. Graciela Husbands.  3 Month HeartLogicT Heart Failure Index:    8 Day Data Trend:          Karie Soda, RN 08/26/2023 2:45 PM

## 2023-08-26 NOTE — Progress Notes (Unsigned)
  Cardiology Office Note:   Date:  08/27/2023  ID:  [name], DOB 09/16/1975, MRN [id number]  Primary Cardiologist: Lesleigh Noe, MD (Inactive) Electrophysiologist: Lewayne Bunting, MD   History of Present Illness:   [name] is a 48 y.o. female with h/o Chronic systolic CHf, very frequent PVCs, s/p ICD, s/p Barostim seen today for acute visit due to "Barostim issues".    Recently saw Dr. Graciela Husbands 07/23/2023 and ? Was raised if she would be candidate for referral to tertiary center for consideration of her PVCs.  Seen by HF clinic 3/14 was having some fatigue and dry mouth. Taking metolazone about 3x a week.   Patient reports on days that she takes metolazone or extra torsemide, she has an increased sensation of fulness in her throat, almost like someone is grabbing it. It is somewhat positional and is similar to feelings she has had before at higher levels of BAT therapy. Downtitrated today with improvement of her reproducible symptoms.   Has appointment with Dr. Deno Lunger next week to discuss PVC ablation   Review of systems complete and found to be negative unless listed in HPI.    EP Information / Studies Reviewed:    EKG is not ordered today. 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 See last PaceArt report    Physical Exam:   VS:  BP 124/82 (BP Location: Right Arm, Patient Position: Sitting, Cuff Size: Large)   Pulse 97   Resp 16   Ht 5\' 5"  (1.651 m)   Wt 212 lb 12.8 oz (96.5 kg)   SpO2 98%   BMI 35.41 kg/m    Wt Readings from Last 3 Encounters:  08/27/23 212 lb 12.8 oz (96.5 kg)  08/06/23 215 lb 6.4 oz (97.7 kg)  07/28/23 215 lb 6.4 oz (97.7 kg)     GEN: Well  nourished, well developed in no acute distress NECK: No JVD; No carotid bruits CARDIAC: Regular rate and rhythm, no murmurs, rubs, gallops RESPIRATORY:  Clear to auscultation without rales, wheezing or rhonchi  ABDOMEN: Soft, non-tender, non-distended EXTREMITIES:  No edema; No deformity   ASSESSMENT AND PLAN:    Chronic systolic CHF s/p Chief Financial Officer and Barostim implantation NYHA II-III symptoms.   Device titrated from 11.0 millamp to 9.0 milliamp with improvement of her neck fullness/globus sensation.  Device impedence stable. Normal device function See scanned report.  Frequent PVCs Despite amiodarone and mexitil.  Has appointment with Dr. Deno Lunger next week to consider epicardial ablation   Intermittent dysphagia Resolved previously with down titration of her device but now recurrent.  Improved with down titration of device.   Disposition:   Follow up with EP APP in in 3 months  Signed, Graciella Freer, PA-C

## 2023-08-27 ENCOUNTER — Encounter: Payer: Self-pay | Admitting: Student

## 2023-08-27 ENCOUNTER — Ambulatory Visit: Attending: Internal Medicine | Admitting: Student

## 2023-08-27 ENCOUNTER — Telehealth: Payer: Self-pay | Admitting: Internal Medicine

## 2023-08-27 VITALS — BP 124/82 | HR 97 | Resp 16 | Ht 65.0 in | Wt 212.8 lb

## 2023-08-27 DIAGNOSIS — I5042 Chronic combined systolic (congestive) and diastolic (congestive) heart failure: Secondary | ICD-10-CM | POA: Diagnosis not present

## 2023-08-27 DIAGNOSIS — Z95810 Presence of automatic (implantable) cardiac defibrillator: Secondary | ICD-10-CM

## 2023-08-27 DIAGNOSIS — I493 Ventricular premature depolarization: Secondary | ICD-10-CM | POA: Diagnosis not present

## 2023-08-27 NOTE — Patient Instructions (Signed)
 Medication Instructions:  Your physician recommends that you continue on your current medications as directed. Please refer to the Current Medication list given to you today.  *If you need a refill on your cardiac medications before your next appointment, please call your pharmacy*  Lab Work: None ordered If you have labs (blood work) drawn today and your tests are completely normal, you will receive your results only by: MyChart Message (if you have MyChart) OR A paper copy in the mail If you have any lab test that is abnormal or we need to change your treatment, we will call you to review the results.  Follow-Up: At [facility], you and your health needs are our priority.  As part of our continuing mission to provide you with exceptional heart care, our providers are all part of one team.  This team includes your primary Cardiologist (physician) and Advanced Practice Providers or APPs (Physician Assistants and Nurse Practitioners) who all work together to provide you with the care you need, when you need it.  Your next appointment:   3 month(s)  Provider:   Casimiro Needle "Mardelle Matte" Lanna Poche, PA-C      1st Floor: - Lobby - Registration  - Pharmacy  - Lab - Cafe  2nd Floor: - PV Lab - Diagnostic Testing (echo, CT, nuclear med)  3rd Floor: - Vacant  4th Floor: - TCTS (cardiothoracic surgery) - AFib Clinic - Structural Heart Clinic - Vascular Surgery  - Vascular Ultrasound  5th Floor: - HeartCare Cardiology (general and EP) - Clinical Pharmacy for coumadin, hypertension, lipid, weight-loss medications, and med management appointments    Valet parking services will be available as well.

## 2023-08-27 NOTE — Telephone Encounter (Signed)
 Please see note below, addtl information needed for PA    Copied from CRM #[id number]. Topic: Clinical - Prescription Issue >> Aug 27, 2023  5:44 PM Denese Killings wrote: Reason for CRM: Samuel Bouche with [employer] is calling because they received a fax this morning regarding Prior Authorization for Ubrogepant (UBRELVY) 100 MG TABS. There is missing clinical information.

## 2023-08-28 LAB — ECHOCARDIOGRAM COMPLETE
AR max vel: 2.1 cm2
AV Area VTI: 1.7 cm2
AV Area mean vel: 2.04 cm2
AV Mean grad: 2 mmHg
AV Peak grad: 4.4 mmHg
Ao pk vel: 1.05 m/s
Area-P 1/2: 4.63 cm2
Calc EF: 31.9 %
Est EF: 25
MV VTI: 1.99 cm2
S' Lateral: 5.1 cm
Single Plane A2C EF: 29.6 %
Single Plane A4C EF: 29.7 %

## 2023-08-30 ENCOUNTER — Other Ambulatory Visit (HOSPITAL_COMMUNITY): Payer: Self-pay

## 2023-08-30 NOTE — Telephone Encounter (Signed)
 Pharmacy Patient Advocate Encounter  Received notification from [facility] [employer] that Prior Authorization for Ubrelvy 100mg  has been DENIED.  Full denial letter will be uploaded to the media tab. See denial reason below.   PA #/Case ID/Reference #: [id number]

## 2023-08-31 ENCOUNTER — Ambulatory Visit: Admitting: Obstetrics and Gynecology

## 2023-08-31 ENCOUNTER — Telehealth: Payer: Self-pay | Admitting: Pharmacist

## 2023-08-31 ENCOUNTER — Telehealth: Payer: Self-pay

## 2023-08-31 NOTE — Telephone Encounter (Signed)
 Pharmacy Patient Advocate Encounter   Received notification from  [employer]  that additional information for prior authorization for [name] is required/requested.   Insurance verification completed.   The patient is insured through [facility] [employer] .   Per test claim: PA required; PA submitted to above mentioned insurance via Fax Key/confirmation #/EOC -- Status is pending   Fax # [phone] Phone # [phone] (option 2)

## 2023-08-31 NOTE — Telephone Encounter (Signed)
 E-Appeal has been submitted for Ubrelvy. Will advise when response is received, please be advised that most companies may take 30 days to make a decision. Appeal letter and supporting documentation have been uploaded and submitted via the [facility] website on 08/31/2023 @12 :08 pm.  Thank you, Dellie Burns, PharmD Clinical Pharmacist  [place]  Direct Dial: [phone]

## 2023-09-01 ENCOUNTER — Other Ambulatory Visit: Payer: Self-pay | Admitting: Internal Medicine

## 2023-09-01 DIAGNOSIS — I428 Other cardiomyopathies: Secondary | ICD-10-CM | POA: Diagnosis not present

## 2023-09-01 DIAGNOSIS — I493 Ventricular premature depolarization: Secondary | ICD-10-CM | POA: Diagnosis not present

## 2023-09-01 DIAGNOSIS — Z95810 Presence of automatic (implantable) cardiac defibrillator: Secondary | ICD-10-CM | POA: Diagnosis not present

## 2023-09-04 ENCOUNTER — Other Ambulatory Visit: Payer: Self-pay | Admitting: Internal Medicine

## 2023-09-04 ENCOUNTER — Encounter: Payer: Self-pay | Admitting: Internal Medicine

## 2023-09-06 ENCOUNTER — Encounter (INDEPENDENT_AMBULATORY_CARE_PROVIDER_SITE_OTHER): Admitting: Family Medicine

## 2023-09-06 ENCOUNTER — Other Ambulatory Visit (HOSPITAL_COMMUNITY): Payer: Self-pay

## 2023-09-06 ENCOUNTER — Encounter (INDEPENDENT_AMBULATORY_CARE_PROVIDER_SITE_OTHER): Payer: Self-pay

## 2023-09-06 ENCOUNTER — Other Ambulatory Visit: Payer: Self-pay

## 2023-09-06 DIAGNOSIS — J00 Acute nasopharyngitis [common cold]: Secondary | ICD-10-CM

## 2023-09-06 DIAGNOSIS — J029 Acute pharyngitis, unspecified: Secondary | ICD-10-CM

## 2023-09-06 MED ORDER — ALBUTEROL SULFATE HFA 108 (90 BASE) MCG/ACT IN AERS: 1.0000 | INHALATION_SPRAY | Freq: Four times a day (QID) | RESPIRATORY_TRACT | 0 refills | Status: AC | PRN

## 2023-09-06 MED ORDER — FLUTICASONE PROPIONATE 50 MCG/ACT NA SUSP: 2.0000 | Freq: Every day | NASAL | 3 refills | Status: DC | PRN

## 2023-09-06 MED ORDER — BUDESONIDE-FORMOTEROL FUMARATE 160-4.5 MCG/ACT IN AERO: 2.0000 | INHALATION_SPRAY | Freq: Two times a day (BID) | RESPIRATORY_TRACT | 3 refills | Status: DC | PRN

## 2023-09-06 NOTE — Telephone Encounter (Signed)
 Pharmacy Patient Advocate Encounter  Received notification from [facility] [employer] that an appeal for Ubrelvy 100mg  has been APPROVED from 09/02/23 to 08/30/24   PA #/Case ID/Reference #: [id number]

## 2023-09-22 ENCOUNTER — Ambulatory Visit: Payer: PPO

## 2023-09-22 DIAGNOSIS — I428 Other cardiomyopathies: Secondary | ICD-10-CM | POA: Diagnosis not present

## 2023-09-23 LAB — CUP PACEART REMOTE DEVICE CHECK
Battery Remaining Longevity: 90 mo
Battery Remaining Percentage: 65 %
Brady Statistic RV Percent Paced: 0 %
Date Time Interrogation Session: 20250430085700
HighPow Impedance: 101 Ohm
Implantable Lead Connection Status: 753985
Implantable Lead Implant Date: 20180312
Implantable Lead Location: 753860
Implantable Lead Model: 293
Implantable Lead Serial Number: 422842
Implantable Pulse Generator Implant Date: 20180312
Lead Channel Impedance Value: 622 Ohm
Lead Channel Setting Pacing Amplitude: 3.5 V
Lead Channel Setting Pacing Pulse Width: 1 ms
Lead Channel Setting Sensing Sensitivity: 0.5 mV
Pulse Gen Serial Number: 226601
Zone Setting Status: 755011

## 2023-09-27 ENCOUNTER — Ambulatory Visit: Attending: Cardiology

## 2023-09-27 DIAGNOSIS — Z95810 Presence of automatic (implantable) cardiac defibrillator: Secondary | ICD-10-CM | POA: Diagnosis not present

## 2023-09-27 DIAGNOSIS — I5042 Chronic combined systolic (congestive) and diastolic (congestive) heart failure: Secondary | ICD-10-CM

## 2023-09-28 ENCOUNTER — Telehealth: Payer: Self-pay

## 2023-09-28 NOTE — Telephone Encounter (Signed)
 Attempted ICM call and left message requesting manual ICM remote transmission to be sent today to check monthly fluid levels.

## 2023-09-29 NOTE — Progress Notes (Unsigned)
 EPIC Encounter for ICM Monitoring  Patient Name: [name] is a 48 y.o. female Date: 09/29/2023 Primary Care Physican: Adelia Homestead, MD Primary Cardiologist: Bensimhon Electrophysiologist: Lawana Pray 12/28/2022 Weight: 218 lbs       01/27/2023 Weight: 200 lbs     02/03/2023 Weight: 200 lbs 04/21/2023 Weight: 207 lbs (baseline 200-203) 06/01/2023 Office Weight: 214 lbs 07/21/2023 Weight: 206 lbs (baseline 200-203) 07/28/2023 Weight: 209 lbs 08/05/2023 Office Weight: 215 lbs 08/27/2023 Office Weight: 212 lbs Weight: 217 lbs   Spoke with patient and heart failure questions reviewed.  Transmission results reviewed.  Pt reports weight gain of 5-6 pounds, tiredness and cough.     Pt has not taken Metolazone  for 2 weeks and decreased Torsemide  to 40 mg bid for past 2 weeks due to severe cramping when taking 80 mg bid.     Heartlogic HF Index 36 suggesting possible fluid accumulation starting 4/24.     Prescribed: Torsemide  20 mg Take 4 tablets (80 mg total) by mouth twice a day Potassium 20 mEq take 3 tablet(s) (60 mEq total) by mouth twice a day Metolazone  2.5 mg take 1 tablet (2.5 mg total) by mouth as directed.  Per Vernia Good, NP 3/13 HF clinic OV discussed limiting metolazone /extra 40 KCL use to 1-2x/week  Spironolactone  25 mg take 1 tablet (25 mg total) at bedtime.    Labs: 08/16/2023 Creatinine 0.83, BUN 10, Potassium 4.5, Sodium 140, GFR >60 08/06/2023 Creatinine 1.01, BUN 19, Potassium 4.2, Sodium 135, GFR >60 07/27/2023 Creatinine 1.35, BUN 25, Potassium 3.4, Sodium 135, GFR 48 07/23/2023 Creatinine 1.07, BUN 22, Potassium 4.6, Sodium 138, GFR 64 07/16/2023 Creatinine 0.84, BUN 7,   Potassium 3.3, Sodium 137, GFR >60 06/21/2023 Creatinine 0.97, BUN 6,   Potassium 4.6, Sodium 137, GFR >60 06/01/2023 Creatinine 0.86, BUN 7,   Potassium 3.2, Sodium 138, GFR >60 A complete set of results can be found in Results Review.   Recommendations:  Advised to resume Torsemide  80 mg  twice a day (confirmed she is taking K+) and take a metolazone  this week to resolve fluid accumulation.      Follow-up plan: ICM clinic phone appointment on 10/05/2023 to recheck fluid levels.  91 day device clinic remote transmission 09/22/2023.              EP/Cardiology next office visit:   Recall 12/30/2023 with Dr Carolynne Citron.   11/15/2023 with Dr Julane Ny.            Copy of ICM check sent to Dr. Lawana Pray.   Copy sent to Dr Bensimhon for review.  3 Month HeartLogicT Heart Failure Index:    8 Day Data Trend:          Almyra Jain, RN 09/29/2023 4:48 PM

## 2023-10-04 ENCOUNTER — Encounter: Payer: Self-pay | Admitting: Internal Medicine

## 2023-10-05 ENCOUNTER — Ambulatory Visit: Attending: Cardiology

## 2023-10-05 DIAGNOSIS — I5042 Chronic combined systolic (congestive) and diastolic (congestive) heart failure: Secondary | ICD-10-CM

## 2023-10-05 DIAGNOSIS — Z95810 Presence of automatic (implantable) cardiac defibrillator: Secondary | ICD-10-CM

## 2023-10-06 MED ORDER — PREDNISONE 20 MG PO TABS: 40.0000 mg | ORAL_TABLET | Freq: Every day | ORAL | 0 refills | Status: DC

## 2023-10-06 NOTE — Telephone Encounter (Signed)
**Note De-identified  [place] Obfuscation** Please advise 

## 2023-10-08 NOTE — Progress Notes (Signed)
 EPIC Encounter for ICM Monitoring  Patient Name: [name] is a 48 y.o. female Date: 10/08/2023 Primary Care Physican: Adelia Homestead, MD Primary Cardiologist: Bensimhon Electrophysiologist: Lawana Pray 12/28/2022 Weight: 218 lbs       01/27/2023 Weight: 200 lbs     02/03/2023 Weight: 200 lbs 04/21/2023 Weight: 207 lbs (baseline 200-203) 06/01/2023 Office Weight: 214 lbs 07/21/2023 Weight: 206 lbs (baseline 200-203) 07/28/2023 Weight: 209 lbs 08/05/2023 Office Weight: 215 lbs 08/27/2023 Office Weight: 212 lbs 09/27/2023 Weight: 217 lbs 10/08/2023 Weight: unable to reach   Attempted call to patient and unable to reach.  Left detailed message per DPR regarding transmission.  Transmission results reviewed.    Pt reported on 5/5 she had not taken Metolazone  for 2 weeks and decreased Torsemide  to 40 mg bid for past 2 weeks due to severe cramping when taking 80 mg bid.     Heartlogic HF Index increased from 36 to 44 suggesting possible fluid accumulation starting 4/24.     Prescribed: Torsemide  20 mg Take 4 tablets (80 mg total) by mouth twice a day Potassium 20 mEq take 3 tablet(s) (60 mEq total) by mouth twice a day Metolazone  2.5 mg take 1 tablet (2.5 mg total) by mouth as directed.  Per Vernia Good, NP 3/13 HF clinic OV discussed limiting metolazone /extra 40 KCL use to 1-2x/week  Spironolactone  25 mg take 1 tablet (25 mg total) at bedtime.    Labs: 08/16/2023 Creatinine 0.83, BUN 10, Potassium 4.5, Sodium 140, GFR >60 08/06/2023 Creatinine 1.01, BUN 19, Potassium 4.2, Sodium 135, GFR >60 07/27/2023 Creatinine 1.35, BUN 25, Potassium 3.4, Sodium 135, GFR 48 07/23/2023 Creatinine 1.07, BUN 22, Potassium 4.6, Sodium 138, GFR 64 07/16/2023 Creatinine 0.84, BUN 7,   Potassium 3.3, Sodium 137, GFR >60 06/21/2023 Creatinine 0.97, BUN 6,   Potassium 4.6, Sodium 137, GFR >60 06/01/2023 Creatinine 0.86, BUN 7,   Potassium 3.2, Sodium 138, GFR >60 A complete set of results can be found in  Results Review.   Recommendations:  Unable to reach and determine if patient had resumed taking Metolazone  and Torsemide  as prescribed.  Left message to use ER if needed for changes in condition or worsening fluid symptoms.  Also left message suggesting she call Dr Bensimhon's office directly today, 5/16 if Metolazone  and Torsemide  are not helping with fluid accumulation.     Follow-up plan: ICM clinic phone appointment on 10/19/2023 to recheck fluid levels.  91 day device clinic remote transmission 12/22/2023.              EP/Cardiology next office visit:   12/07/2023 with Michaelle Adolphus, PA.   11/15/2023 with Dr Julane Ny.            Copy of ICM check sent to Dr. Lawana Pray.   Copy sent to Dr Bensimhon as Arlie Lain but unable to reach patient.  3 Month HeartLogicT Heart Failure Index:    8 Day Data Trend:          Almyra Jain, RN 10/08/2023 8:40 AM

## 2023-10-13 ENCOUNTER — Ambulatory Visit
Admission: RE | Admit: 2023-10-13 | Discharge: 2023-10-13 | Disposition: A | Discharge status: Home / Self Care | Source: Ambulatory Visit | Attending: Nurse Practitioner | Admitting: Nurse Practitioner

## 2023-10-13 ENCOUNTER — Encounter: Payer: Self-pay | Admitting: Hematology and Oncology

## 2023-10-13 VITALS — BP 127/85 | HR 98 | Temp 98.2°F | Resp 20 | Wt 212.7 lb

## 2023-10-13 DIAGNOSIS — H6122 Impacted cerumen, left ear: Secondary | ICD-10-CM | POA: Diagnosis not present

## 2023-10-13 NOTE — ED Provider Notes (Addendum)
 [facility]    CSN: [id number] Arrival date & time: 10/13/23  1340      History   Chief Complaint Chief Complaint  Patient presents with   Ear Fullness    Entered by patient    HPI [name] is a 48 y.o. female.   Patient presents for decreased hearing and left ear discomfort - symptoms have been progressive for several weeks, but seemed to intensify this week.  It is now causing her discomfort in her ear and a headache.  Decreased hearing.  No reported dizziness.  She suspects it may be due to a build up of cerumen.  No other symptoms or concerns voiced today.  The history is provided by the patient.  Ear Fullness Associated symptoms include headaches. Pertinent negatives include no chest pain, no abdominal pain and no shortness of breath.    Past Medical History:  Diagnosis Date   Acute on chronic systolic (congestive) heart failure (HCC) 04/29/2017   Acute pain of right shoulder 06/16/2017   AICD (automatic cardioverter/defibrillator) present    Anginal pain (HCC)    Anxiety    Arthritis    right shoulder    Asthma    CHF (congestive heart failure) (HCC)    Chronic combined systolic and diastolic heart failure (HCC) 03/05/2014   Closed low lateral malleolus fracture 10/23/2013   Cystitis 10/21/2017   Depression    Depression with anxiety 01/20/2013   Diabetes mellitus without complication (HCC)    Diverticulosis    Dyspnea    comes and goes intermittently mostly with exertion    Dysrhythmia    Essential hypertension    Prev followed by [name]    Fibroid    age 29   Gallstones    Generalized abdominal cramping    History of cardiomyopathy    Hypertension    IBS (irritable bowel syndrome)    ICD (implantable cardioverter-defibrillator), single, 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 CPAP intolerance >> "smothering" Good tolerance of auto device (optimal pressure 12-13 on download).  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   PVC (premature ventricular contraction) 06/23/2016   Seizures (HCC)    "as a child" (08/03/2016)   Termination of pregnancy    due to cardiac risk    Patient Active Problem List   Diagnosis Date Noted   Vitamin D  deficiency 06/11/2023   Impaired fasting blood sugar 06/11/2023   Cervical disc disorder with radiculopathy of cervical region 03/09/2023   Uterine leiomyoma 10/28/2022   Fibroid 10/28/2022   Fibroids 10/28/2022   Encounter for general adult medical examination with abnormal findings 05/04/2022   GERD (gastroesophageal reflux disease) 03/10/2022   Right wrist pain 04/11/2021   Morbid obesity (HCC) 02/28/2021   Greater trochanteric bursitis of right hip 12/26/2020   Congestive heart failure,  NYHA class III (HCC) 11/08/2020   Asthma 08/25/2020   Iron  deficiency anemia due to chronic blood loss 08/06/2020   Greater trochanteric bursitis, left 07/01/2020   V-tach (HCC) 11/12/2019   Headache 10/26/2018   Chronic right-sided low back pain without sciatica 06/24/2018   Patellar subluxation, left, initial encounter 04/25/2018   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   Diabetes mellitus without complication (HCC) 06/02/2017   Insomnia 05/07/2017   ICD (implantable cardioverter-defibrillator), single, 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   Patellofemoral syndrome of  both knees 10/16/2016   NICM (nonischemic cardiomyopathy) (HCC) 08/03/2016   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   Labral tear of shoulder 04/04/2015   Chronic combined systolic and diastolic heart failure (HCC) 03/05/2014   IBS (irritable bowel syndrome)    MDD (major depressive disorder) 01/20/2013   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   OSA (obstructive sleep apnea) 01/02/2013   Essential hypertension     Past Surgical History:  Procedure Laterality Date   BREAST BIOPSY Right 09/30/2022   US  RT BREAST BX W LOC DEV 1ST LESION IMG BX SPEC US  GUIDE 09/30/2022 [facility]   CARDIAC CATHETERIZATION N/A 11/11/2015   Procedure: Right/Left Heart Cath and Coronary Angiography;  Surgeon: Millicent Ally, MD;  Location: [facility];  Service: Cardiovascular;  Laterality: N/A;   CARDIAC CATHETERIZATION  ~ 2015   CARDIAC DEFIBRILLATOR PLACEMENT  08/03/2016   CESAREAN SECTION  1999   COLONOSCOPY WITH PROPOFOL  N/A 04/21/2016   Procedure: COLONOSCOPY WITH PROPOFOL ;  Surgeon: Nannette Babe, MD;  Location: [facility];  Service: Gastroenterology;  Laterality: N/A;   ESOPHAGOGASTRODUODENOSCOPY (EGD) WITH PROPOFOL  N/A 04/21/2016   Procedure: ESOPHAGOGASTRODUODENOSCOPY (EGD) WITH PROPOFOL ;  Surgeon: Nannette Babe, MD;  Location: [facility];  Service: Gastroenterology;  Laterality: N/A;   FOOT FRACTURE SURGERY Right ~ 2003   FRACTURE SURGERY     ICD IMPLANT N/A 08/03/2016   Procedure: ICD Implant;  Surgeon: Verona Goodwill, MD;  Location[facility];  Service: Cardiovascular;  Laterality: N/A;   IR ANGIOGRAM PELVIS SELECTIVE OR SUPRASELECTIVE  10/28/2022   IR ANGIOGRAM SELECTIVE EACH ADDITIONAL VESSEL  10/28/2022   IR ANGIOGRAM SELECTIVE EACH ADDITIONAL VESSEL  10/28/2022   IR ANGIOGRAM SELECTIVE EACH ADDITIONAL VESSEL  10/28/2022   IR EMBO TUMOR ORGAN ISCHEMIA INFARCT INC GUIDE ROADMAPPING  10/28/2022   IR US  GUIDE VASC ACCESS LEFT  10/28/2022   IR US  GUIDE VASC ACCESS RIGHT  10/28/2022   LAPAROSCOPIC  CHOLECYSTECTOMY  12/2006   LAPAROSCOPIC GASTRIC SLEEVE RESECTION     LAPAROSCOPY ABDOMEN DIAGNOSTIC  2008   "cut bile duct w/gallbladder OR; had to go in later & fix leak; hospitalized for 2 months"   LEFT HEART CATHETERIZATION WITH CORONARY ANGIOGRAM N/A 02/26/2014   Procedure: LEFT HEART CATHETERIZATION WITH CORONARY ANGIOGRAM;  Surgeon: Lucendia Rusk, MD;  Location: [facility] CATH LAB;  Service: Cardiovascular;  Laterality: N/A;   OVARIAN CYST REMOVAL Right 1999   PVC ABLATION N/A 12/01/2017   Procedure: PVC ABLATION;  Surgeon: Tammie Fall, MD;  Location: [facility];  Service: Cardiovascular;  Laterality: N/A;   RIGHT HEART CATH N/A 08/05/2017   Procedure: RIGHT HEART CATH;  Surgeon: Mardell Shade, MD;  Location: [facility];  Service: Cardiovascular;  Laterality: N/A;   RIGHT HEART CATH N/A 11/16/2019   Procedure: RIGHT HEART CATH;  Surgeon: Mardell Shade, MD;  Location: [facility];  Service: Cardiovascular;  Laterality: N/A;   TUBAL LIGATION  1999  OB History     Gravida  2   Para  1   Term      Preterm      AB  1   Living  1      SAB      IAB  1   Ectopic      Multiple      Live Births               Home Medications    Prior to Admission medications   Medication Sig Start Date End Date Taking? Authorizing Provider  acetaminophen  (TYLENOL ) 650 MG CR tablet Take 650 mg by mouth every 8 (eight) hours as needed for pain (headache).    [provider]  albuterol  (VENTOLIN  HFA) 108 (90 Base) MCG/ACT inhaler Inhale 1-2 puffs into the lungs every 6 (six) hours as needed for wheezing or shortness of breath. 09/06/23   Adelia Homestead, MD  amiodarone  (PACERONE ) 200 MG tablet Take 1 tablet (200 mg total) by mouth daily. 02/04/21   Tylene Galla, PA-C  budesonide -formoterol  (SYMBICORT ) 160-4.5 MCG/ACT inhaler Inhale 2 puffs into the lungs 2 (two) times daily as needed. 09/06/23   Adelia Homestead, MD   dicyclomine  (BENTYL ) 20 MG tablet Take 1 tablet (20 mg total) by mouth daily as needed for spasms. 05/30/20   Adelia Homestead, MD  digoxin  (LANOXIN ) 0.125 MG tablet Take 1 tablet (0.125 mg total) by mouth daily. 08/06/23   Elmarie Hacking, FNP  fluticasone  (FLONASE ) 50 MCG/ACT nasal spray Place 2 sprays into both nostrils daily as needed for allergies. 09/06/23   Adelia Homestead, MD  gabapentin  (NEURONTIN ) 300 MG capsule Take 1 capsule (300 mg total) by mouth at bedtime as needed (back spasms). 05/30/20   Adelia Homestead, MD  ipratropium-albuterol  (DUONEB) 0.5-2.5 (3) MG/3ML SOLN USE 1 AMPULE IN NEBULIZER EVERY 6 HOURS AS NEEDED 09/02/23   Adelia Homestead, MD  ivabradine  (CORLANOR ) 7.5 MG TABS tablet Take 1 tablet (7.5 mg total) by mouth 2 (two) times daily with a meal. 06/01/23   Bensimhon, Rheta Celestine, MD  JARDIANCE  25 MG TABS tablet TAKE 1 TABLET BY MOUTH ONCE DAILY BEFORE BREAKFAST 08/24/23   Adelia Homestead, MD  Magnesium  Oxide 200 MG TABS Take 1 tablet (200 mg total) by mouth daily. 05/30/20   Bensimhon, Rheta Celestine, MD  metolazone  (ZAROXOLYN ) 2.5 MG tablet TAKE 1 TABLET BY MOUTH AS DIRECTED 06/01/23   Bensimhon, Rheta Celestine, MD  metoprolol  succinate (TOPROL -XL) 100 MG 24 hr tablet Take 1 tablet (100 mg total) by mouth daily. Take with or immediately following a meal. 07/31/22   Bensimhon, Rheta Celestine, MD  mexiletine (MEXITIL ) 150 MG capsule Take 2 capsules (300 mg total) by mouth 2 (two) times daily. 06/01/23   Bensimhon, Daniel R, MD  Multiple Vitamins-Minerals (MULTIVITAMIN GUMMIES WOMENS PO) Take 2 tablets by mouth daily.    [provider]  naproxen  sodium (ALEVE ) 220 MG tablet Take 220-440 mg by mouth 2 (two) times daily as needed (Pain).    [provider]  nitroGLYCERIN  (NITROSTAT ) 0.4 MG SL tablet Place 1 tablet (0.4 mg total) under the tongue every 5 (five) minutes as needed for chest pain. 06/20/21   Bensimhon, Rheta Celestine, MD  potassium chloride  SA (KLOR-CON  M) 20  MEQ tablet Take 3 tablets (60 mEq total) by mouth 2 (two) times daily. 08/06/23   Elmarie Hacking, FNP  predniSONE  (DELTASONE ) 20 MG tablet Take 2 tablets (40 mg total)  by mouth daily with breakfast. 10/06/23   Adelia Homestead, MD  sacubitril -valsartan  (ENTRESTO ) 97-103 MG Take 1 tablet by mouth twice daily 08/31/22   Bensimhon, Rheta Celestine, MD  spironolactone  (ALDACTONE ) 25 MG tablet Take 1 tablet (25 mg total) by mouth at bedtime. 06/15/23   Bensimhon, Rheta Celestine, MD  tizanidine  (ZANAFLEX ) 2 MG capsule Take 2 mg by mouth as needed for muscle spasms.    [provider]  torsemide  (DEMADEX ) 20 MG tablet Take 4 tablets (80 mg total) by mouth 2 (two) times daily. Patient taking differently: Take 40 mg by mouth 2 (two) times daily. 08/06/23 11/04/23  Elmarie Hacking, FNP  Ubrogepant  (UBRELVY ) 100 MG TABS Take 1 tablet (100 mg total) by mouth daily as needed (migraine). 01/07/23   Adelia Homestead, MD  Vitamin D , Ergocalciferol , (DRISDOL ) 1.25 MG (50000 UNIT) CAPS capsule Take 1 capsule by mouth once a week 05/31/23   Adelia Homestead, MD    Family History Family History  Problem Relation Age of Onset   Emphysema Maternal Grandmother        smoked   Heart disease Maternal Grandmother 6       MI   Rheum arthritis Mother    Allergies Daughter    Colon cancer Neg Hx     Social History Social History   Tobacco Use   Smoking status: Never    Passive exposure: Never   Smokeless tobacco: Never  Vaping Use   Vaping status: Never Used  Substance Use Topics   Alcohol use: No   Drug use: No     Allergies   Vancomycin , Aspirin , Contrast media [iodinated contrast media], Ciprofloxacin, Cyclobenzaprine , Farxiga  [dapagliflozin], and Sulfa antibiotics   Review of Systems Review of Systems  Constitutional:  Negative for chills and fever.  HENT:  Positive for ear pain. Negative for congestion, rhinorrhea and sore throat.        Left ear pressure, decreased hearing.  Eyes:   Negative for pain and redness.  Respiratory:  Negative for cough and shortness of breath.   Cardiovascular:  Negative for chest pain and palpitations.  Gastrointestinal:  Negative for abdominal pain, diarrhea and vomiting.  Genitourinary:  Negative for dysuria.  Skin:  Negative for rash.  Neurological:  Positive for headaches. Negative for dizziness.   Physical Exam Triage Vital Signs ED Triage Vitals  Encounter Vitals Group     BP 10/13/23 1413 127/85     Systolic BP Percentile --      Diastolic BP Percentile --      Pulse Rate 10/13/23 1413 98     Resp 10/13/23 1413 20     Temp 10/13/23 1413 98.2 F (36.8 C)     Temp Source 10/13/23 1413 Oral     SpO2 10/13/23 1413 97 %     Weight 10/13/23 1413 212 lb 11.9 oz (96.5 kg)     Height --      Head Circumference --      Peak Flow --      Pain Score 10/13/23 1412 6     Pain Loc --      Pain Education --      Exclude from Growth Chart --    No data found.  Updated Vital Signs BP 127/85 (BP Location: Left Arm)   Pulse 98   Temp 98.2 F (36.8 C) (Oral)   Resp 20   Wt 212 lb 11.9 oz (96.5 kg)   LMP  (LMP Unknown)  SpO2 97%   BMI 35.40 kg/m   Visual Acuity Right Eye Distance:   Left Eye Distance:   Bilateral Distance:    Right Eye Near:   Left Eye Near:    Bilateral Near:     Physical Exam Vitals and nursing note reviewed.  Constitutional:      Appearance: Normal appearance.  HENT:     Right Ear: Tympanic membrane and ear canal normal. There is no impacted cerumen.     Left Ear: There is impacted cerumen.     Nose: No congestion.     Mouth/Throat:     Mouth: Mucous membranes are moist.     Pharynx: No posterior oropharyngeal erythema.  Eyes:     Conjunctiva/sclera: Conjunctivae normal.     Pupils: Pupils are equal, round, and reactive to light.  Cardiovascular:     Rate and Rhythm: Normal rate. Rhythm irregular.     Heart sounds: Normal heart sounds.  Pulmonary:     Effort: Pulmonary effort is normal.      Breath sounds: Normal breath sounds.  Abdominal:     General: Bowel sounds are normal.  Skin:    General: Skin is warm and dry.  Neurological:     General: No focal deficit present.     Mental Status: She is alert and oriented to person, place, and time.  Psychiatric:        Mood and Affect: Mood normal.        Behavior: Behavior normal.        Thought Content: Thought content normal.        Judgment: Judgment normal.    UC Treatments / Results  Labs (all labs ordered are listed, but only abnormal results are displayed) Labs Reviewed - No data to display  EKG   Radiology No results found.  Procedures Ear Cerumen Removal  Date/Time: 10/13/2023 3:49 PM  Performed by: Genene Kennel, FNP Authorized by: Genene Kennel, FNP   Consent:    Consent obtained:  Verbal   Consent given by:  Patient Procedure details:    Location:  L ear   Procedure type: irrigation     Procedure outcomes: cerumen removed   Post-procedure details:    Hearing quality:  Normal   Procedure completion:  Tolerated well, no immediate complications  (including critical care time)  Performed by Melissa Spring, RN  Medications Ordered in UC Medications - No data to display  Initial Impression / Assessment and Plan / UC Course  I have reviewed the triage vital signs and the nursing notes.  Pertinent labs & imaging results that were available during my care of the patient were reviewed by me and considered in my medical decision making (see chart for details).    Patient presented with decreased hearing and left ear pressure that has been progressively worsening this weak.  She has a full cerumen impaction - clinical staff irrigated her ear with successful removal of the cerumen and complete resolution of her hearing and discomfort.  Tympanic membrane is unremarkable and intact.  Left ear canal is cleared and unremarkable. Final Clinical Impressions(s) / UC Diagnoses   Final diagnoses:  Impacted  cerumen of left ear     Discharge Instructions      Successful irrigation of your left ear.  Return for re-evaluation of any further concerns or questions.   ED Prescriptions   None    PDMP not reviewed this encounter.   Genene Kennel, FNP 10/13/23 [phone]  Genene Kennel, FNP 10/13/23 [phone]

## 2023-10-13 NOTE — Discharge Instructions (Signed)
 Successful irrigation of your left ear.  Return for re-evaluation of any further concerns or questions.

## 2023-10-13 NOTE — ED Triage Notes (Signed)
 Pt presents c/o left wax buildup x 7 days. Pt needing ear irrigation.

## 2023-10-19 ENCOUNTER — Encounter

## 2023-10-21 ENCOUNTER — Ambulatory Visit (INDEPENDENT_AMBULATORY_CARE_PROVIDER_SITE_OTHER): Admitting: Adult Health

## 2023-10-21 ENCOUNTER — Encounter (INDEPENDENT_AMBULATORY_CARE_PROVIDER_SITE_OTHER): Payer: Self-pay | Admitting: Adult Health

## 2023-10-21 VITALS — BP 125/85 | HR 84 | Temp 97.8°F | Ht 65.0 in | Wt 216.0 lb

## 2023-10-21 DIAGNOSIS — Z6835 Body mass index (BMI) 35.0-35.9, adult: Secondary | ICD-10-CM | POA: Diagnosis not present

## 2023-10-21 DIAGNOSIS — O903 Peripartum cardiomyopathy: Secondary | ICD-10-CM

## 2023-10-21 DIAGNOSIS — E559 Vitamin D deficiency, unspecified: Secondary | ICD-10-CM | POA: Diagnosis not present

## 2023-10-21 DIAGNOSIS — I5042 Chronic combined systolic (congestive) and diastolic (congestive) heart failure: Secondary | ICD-10-CM | POA: Diagnosis not present

## 2023-10-21 DIAGNOSIS — Z0000 Encounter for general adult medical examination without abnormal findings: Secondary | ICD-10-CM

## 2023-10-21 DIAGNOSIS — Z0289 Encounter for other administrative examinations: Secondary | ICD-10-CM

## 2023-10-21 NOTE — Progress Notes (Signed)
 No ICM remote transmission received for 10/19/2023 and next ICM transmission scheduled for 11/01/2023.

## 2023-10-21 NOTE — Progress Notes (Signed)
 Office: [phone]  /  Fax: [phone]   Initial Visit    [name] was seen in clinic today to evaluate for obesity. She is interested in losing weight to improve overall health and reduce the risk of weight related complications. She presents today to review program treatment options, initial physical assessment, and evaluation.      She was referred by: Specialist  When asked what else they would like to accomplish? She states: Adopt healthier eating patterns, Improve energy levels and physical activity, Improve existing medical conditions, Reduce number of medications, Improve quality of life, and Avoid another implantable cardiac device  When asked how has your weight affected you? She states: Contributed to medical problems, Contributed to orthopedic problems or mobility issues, Having fatigue, Having poor endurance, Problems with eating patterns, and Has affected mood   Weight history: Weight gain since Cardiomypoathy s/p pregnancy in 1999  Highest weight: 340 lbs  Some associated conditions: Hypertension, Hyperlipidemia, and Other: Cardiomypoathy  Contributing factors: disruption of circadian rhythm / sleep disordered breathing, consumption of processed foods, use of obesogenic medications: Beta-blockers and Other: Gabapentin , moderate to high levels of stress, and reduced physical acitivity  Weight promoting medications identified: Beta-blockers and Other: Gabapentin   Prior weight loss attempts: Balanced Plate / Portion Control  Current nutrition plan: None  Current level of physical activity: Walking 30 minutes, twice a week  Current or previous pharmacotherapy: None  Response to medication: Never tried medications   Past medical history includes:   Past Medical History:  Diagnosis Date   Acute on chronic systolic (congestive) heart failure (HCC) 04/29/2017   Acute pain of right shoulder 06/16/2017   AICD (automatic cardioverter/defibrillator) present     Anginal pain (HCC)    Anxiety    Arthritis    right shoulder    Asthma    CHF (congestive heart failure) (HCC)    Chronic combined systolic and diastolic heart failure (HCC) 03/05/2014   Closed low lateral malleolus fracture 10/23/2013   Cystitis 10/21/2017   Depression    Depression with anxiety 01/20/2013   Diabetes mellitus without complication (HCC)    Diverticulosis    Dyspnea    comes and goes intermittently mostly with exertion    Dysrhythmia    Essential hypertension    Prev followed by [name]    Fibroid    age 48   Gallstones    Generalized abdominal cramping    History of cardiomyopathy    Hypertension    IBS (irritable bowel syndrome)    ICD (implantable cardioverter-defibrillator), single, 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 CPAP intolerance >> "smothering" Good tolerance of auto device (optimal pressure 12-13 on download).  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  Body Fat%:45.0, Visceral Fat Rating:12, Weight trend over the last 12 months: Decreasing  General:  Alert, oriented and cooperative. Patient is in no acute distress.  Respiratory: Normal respiratory effort, no problems with respiration noted   Gait: able to ambulate independently  Mental Status: Normal mood and affect. Normal behavior. Normal judgment and thought content.   DIAGNOSTIC DATA REVIEWED:  BMET    Component Value Date/Time   NA 140 08/16/2023 1602   NA 138 07/23/2023 1322   K 4.5 08/16/2023 1602   CL 110 08/16/2023 1602   CO2 20 (L) 08/16/2023 1602   GLUCOSE 91 08/16/2023 1602   BUN 10 08/16/2023 1602   BUN 22 07/23/2023 1322   CREATININE 0.83 08/16/2023 1602   CREATININE 0.90 03/26/2016 1536   CALCIUM 9.1 08/16/2023 1602   GFRNONAA >60 08/16/2023 1602   GFRNONAA >89 01/18/2014 1621   GFRAA >60 02/15/2020 1845   GFRAA >89 01/18/2014 1621   Lab Results  Component Value Date   HGBA1C 5.2 07/16/2023   HGBA1C 5.4 11/09/2015   No results found for: "INSULIN " CBC    Component Value Date/Time   WBC 5.5 07/16/2023 1218   RBC 4.85 07/16/2023 1218   HGB 13.4 07/16/2023 1218   HGB 10.6 (L) 09/07/2019 1159   HCT 42.1 07/16/2023 1218   HCT 32.6 (L) 08/06/2020 1517   PLT 279 07/16/2023 1218   PLT 278 09/07/2019 1159   MCV 86.8 07/16/2023 1218   MCV 82 09/07/2019 1159   MCH 27.6 07/16/2023 1218   MCHC 31.8 07/16/2023 1218   RDW 15.0 07/16/2023 1218   RDW 14.7 09/07/2019 1159   Iron /TIBC/Ferritin/ %Sat    Component Value Date/Time   IRON  61 07/16/2023 1218   TIBC 472 (H) 07/16/2023 1218   FERRITIN 11 07/16/2023 1218   IRONPCTSAT 13 07/16/2023 1218   Lipid Panel     Component Value Date/Time   CHOL 164 06/11/2023 1107   TRIG 66.0 06/11/2023 1107   HDL 67.00 06/11/2023 1107   CHOLHDL 2 06/11/2023 1107   VLDL 13.2 06/11/2023 1107   LDLCALC 83 06/11/2023 1107   Hepatic Function Panel     Component Value Date/Time   PROT 6.7 01/21/2023  1523   PROT 7.3 05/15/2021 1032   ALBUMIN  3.8 01/21/2023 1523   ALBUMIN  4.5 05/15/2021 1032   AST 38 01/21/2023 1523   ALT 30 01/21/2023 1523   ALKPHOS 64 01/21/2023 1523   BILITOT 1.1 01/21/2023 1523   BILITOT 0.3 05/15/2021 1032   BILIDIR <0.1 08/26/2020 0201   IBILI 0.6 11/21/2017 2155      Component Value Date/Time   TSH 1.750 07/23/2023 1321     Assessment and Plan   Healthcare maintenance  Vitamin D  deficiency  Postpartum cardiomyopathy  Chronic combined systolic and diastolic heart failure (HCC)  Morbid obesity (HCC),STARTING BMI 36.0   Assessment and Plan       ESTABLISH WITH [facility]   Obesity Treatment / Action Plan:  Patient will work on garnering support from family and friends to begin  weight loss journey. Will work on eliminating or reducing the presence of highly palatable, calorie dense foods in the home. Will complete provided nutritional and psychosocial assessment questionnaire before the next appointment. Will be scheduled for indirect calorimetry to determine resting energy expenditure in a fasting state.  This will allow us  to create a reduced calorie, high-protein meal plan to promote loss of fat mass while preserving muscle mass. Counseled on the health benefits of losing 5%-15% of total body weight. Was counseled on nutritional approaches to weight loss and benefits of reducing processed foods and consuming plant-based foods and high quality protein as part of nutritional weight management. Was counseled on pharmacotherapy and role as an adjunct in weight management.   Obesity Education Performed Today:  She was weighed on the bioimpedance scale and results were discussed and documented in the synopsis.  We discussed obesity as a disease and the importance of a more detailed evaluation of all the factors contributing to the disease.  We discussed the importance of long term lifestyle changes which include nutrition, exercise and behavioral  modifications as well as the importance of customizing this to her specific health and social needs.  We discussed the benefits of reaching a healthier weight to alleviate the symptoms of existing conditions and reduce the risks of the biomechanical, metabolic and psychological effects of obesity.  We reviewed the four pillars of obesity medicine and importance of using a multimodal approach.  We reviewed the basic principles in weight management.   Marilyn M Malmberg appears to be in the action stage of change and states they are ready to start intensive lifestyle modifications and behavioral modifications.  I have spent 38 minutes in the care of the patient today including: 5 minutes before the visit reviewing and preparing the chart. 28 minutes face-to-face assessing and reviewing listed medical problems as outlined in obesity care plan, providing nutritional and behavioral counseling on topics outlined in the obesity care plan, counseling regarding anti-obesity medication as outlined in obesity care plan, independently interpreting test results and goals of care, as described in assessment and plan, and reviewing and discussing biometric information and progress 5 minutes after the visit updating chart and documentation of encounter.  Reviewed by clinician on day of visit: allergies, medications, problem list, medical history, surgical history, family history, social history, and previous encounter notes pertinent to obesity diagnosis.  Stavros Cail d. Hiawatha Dressel, NP-C

## 2023-11-01 ENCOUNTER — Ambulatory Visit: Attending: Cardiology

## 2023-11-01 ENCOUNTER — Other Ambulatory Visit: Payer: Self-pay | Admitting: Diagnostic Radiology

## 2023-11-01 DIAGNOSIS — Z95810 Presence of automatic (implantable) cardiac defibrillator: Secondary | ICD-10-CM

## 2023-11-01 DIAGNOSIS — Z1231 Encounter for screening mammogram for malignant neoplasm of breast: Secondary | ICD-10-CM

## 2023-11-01 DIAGNOSIS — I5042 Chronic combined systolic (congestive) and diastolic (congestive) heart failure: Secondary | ICD-10-CM | POA: Diagnosis not present

## 2023-11-04 ENCOUNTER — Ambulatory Visit
Admission: RE | Admit: 2023-11-04 | Discharge: 2023-11-04 | Disposition: A | Discharge status: Home / Self Care | Source: Ambulatory Visit | Attending: Diagnostic Radiology | Admitting: Diagnostic Radiology

## 2023-11-04 ENCOUNTER — Other Ambulatory Visit (HOSPITAL_COMMUNITY): Payer: Self-pay

## 2023-11-04 ENCOUNTER — Encounter

## 2023-11-04 ENCOUNTER — Other Ambulatory Visit: Payer: Self-pay | Admitting: Internal Medicine

## 2023-11-04 DIAGNOSIS — Z1231 Encounter for screening mammogram for malignant neoplasm of breast: Secondary | ICD-10-CM | POA: Diagnosis not present

## 2023-11-04 NOTE — Addendum Note (Signed)
 Addended by: Lott Rouleau A on: 11/04/2023 02:48 PM   Modules accepted: Orders

## 2023-11-04 NOTE — Progress Notes (Signed)
 Remote ICD transmission.

## 2023-11-05 ENCOUNTER — Telehealth: Payer: Self-pay

## 2023-11-05 NOTE — Telephone Encounter (Signed)
Remote ICM transmission received.  Attempted call to patient regarding ICM remote transmission and left detailed message per DPR.  Left ICM phone number and advised to return call for any fluid symptoms or questions.

## 2023-11-05 NOTE — Progress Notes (Signed)
 EPIC Encounter for ICM Monitoring  Patient Name: [name] is a 48 y.o. female Date: 11/05/2023 Primary Care Physican: Adelia Homestead, MD Primary Cardiologist: Bensimhon Electrophysiologist: Lawana Pray 12/28/2022 Weight: 218 lbs       01/27/2023 Weight: 200 lbs     02/03/2023 Weight: 200 lbs 04/21/2023 Weight: 207 lbs (baseline 200-203) 06/01/2023 Office Weight: 214 lbs 07/21/2023 Weight: 206 lbs (baseline 200-203) 07/28/2023 Weight: 209 lbs 08/05/2023 Office Weight: 215 lbs 08/27/2023 Office Weight: 212 lbs 09/27/2023 Weight: 217 lbs 10/08/2023 Weight: unable to reach   Attempted call to patient and unable to reach.  Left detailed message per DPR regarding transmission.  Transmission results reviewed.    [facility] HF Index is 3 suggesting fluid levels returned to normal threshold 6/7 (possible fluid accumulation occurred from 4/24-6/7.     Prescribed: Torsemide  20 mg Take 4 tablets (80 mg total) by mouth twice a day Potassium 20 mEq take 3 tablet(s) (60 mEq total) by mouth twice a day Metolazone  2.5 mg take 1 tablet (2.5 mg total) by mouth as directed.  Per Vernia Good, NP 3/13 HF clinic OV discussed limiting metolazone /extra 40 KCL use to 1-2x/week  Spironolactone  25 mg take 1 tablet (25 mg total) at bedtime.    Labs: 08/16/2023 Creatinine 0.83, BUN 10, Potassium 4.5, Sodium 140, GFR >60 08/06/2023 Creatinine 1.01, BUN 19, Potassium 4.2, Sodium 135, GFR >60 07/27/2023 Creatinine 1.35, BUN 25, Potassium 3.4, Sodium 135, GFR 48 07/23/2023 Creatinine 1.07, BUN 22, Potassium 4.6, Sodium 138, GFR 64 07/16/2023 Creatinine 0.84, BUN 7,   Potassium 3.3, Sodium 137, GFR >60 06/21/2023 Creatinine 0.97, BUN 6,   Potassium 4.6, Sodium 137, GFR >60 06/01/2023 Creatinine 0.86, BUN 7,   Potassium 3.2, Sodium 138, GFR >60 A complete set of results can be found in Results Review.   Recommendations:  Left voice mail with ICM number and encouraged to call if experiencing any fluid symptoms.     Follow-up plan: ICM clinic phone appointment on 12/27/2023.  91 day device clinic remote transmission 12/22/2023.              EP/Cardiology next office visit:  11/10/2023 with Dr Carolynne Citron.   12/07/2023 with Michaelle Adolphus, PA.   11/15/2023 with Dr Julane Ny.            Copy of ICM check sent to Dr. Lawana Pray.   3 Month [facility] Heart Failure Index:    8 Day Data Trend:          Almyra Jain, RN 11/05/2023 1:09 PM

## 2023-11-10 ENCOUNTER — Other Ambulatory Visit: Payer: Self-pay | Admitting: Internal Medicine

## 2023-11-10 ENCOUNTER — Ambulatory Visit

## 2023-11-10 ENCOUNTER — Encounter: Payer: Self-pay | Admitting: Internal Medicine

## 2023-11-10 ENCOUNTER — Ambulatory Visit: Attending: Cardiology | Admitting: Internal Medicine

## 2023-11-10 VITALS — BP 112/72 | HR 99 | Ht 65.0 in | Wt 218.0 lb

## 2023-11-10 DIAGNOSIS — I5022 Chronic systolic (congestive) heart failure: Secondary | ICD-10-CM | POA: Diagnosis not present

## 2023-11-10 DIAGNOSIS — I493 Ventricular premature depolarization: Secondary | ICD-10-CM

## 2023-11-10 DIAGNOSIS — R928 Other abnormal and inconclusive findings on diagnostic imaging of breast: Secondary | ICD-10-CM

## 2023-11-10 LAB — CUP PACEART INCLINIC DEVICE CHECK
Date Time Interrogation Session: 20250618161052
HighPow Impedance: 97 Ohm
Implantable Lead Connection Status: 753985
Implantable Lead Implant Date: 20180312
Implantable Lead Location: 753860
Implantable Lead Model: 293
Implantable Lead Serial Number: 422842
Implantable Pulse Generator Implant Date: 20180312
Lead Channel Impedance Value: 652 Ohm
Lead Channel Pacing Threshold Amplitude: 2.6 V
Lead Channel Pacing Threshold Amplitude: 2.7 V
Lead Channel Pacing Threshold Pulse Width: 1 ms
Lead Channel Pacing Threshold Pulse Width: 1 ms
Lead Channel Sensing Intrinsic Amplitude: 15.9 mV
Lead Channel Setting Pacing Amplitude: 3.5 V
Lead Channel Setting Pacing Pulse Width: 1 ms
Lead Channel Setting Sensing Sensitivity: 0.5 mV
Pulse Gen Serial Number: 226601
Zone Setting Status: 755011

## 2023-11-10 NOTE — Progress Notes (Signed)
 HPI [name] returns for evaluation of PVC's. She is a pleasant 48 yo woman with a Megan/o peripartum CM. She also has had variable amounts of PVC's over the years. She had 5% PVC burden in 2016 in the setting of an EF of 25% and underwent ICD insertion. She never has had an ICD therapy. She had worsening symptoms and a repeat monitor in 2018 showed a 30% PVC burden and she underwent PVC ablation by me where we mapped her PVC's to the septum between the RV and LVOT. We had a 90% pace map match and RF applied to the RVOT septum would suppress the PVC's but not eliminate them. Her PVC morphology is inferior axis with transition from neg to positive at V3 with the PVC in V3 much more positive than the sinus beat in V3, suggestive of an LVOT location. She had a repeat Zio about 18 months ago showing a 13% PVC burden. She has class 2B symptoms and is on maximal medical therapy. She has minimal palpitations but states that she doesn't feel right.  Allergies  Allergen Reactions   Vancomycin  Other (See Comments)    did something to my kidneys, PROGRESSED TO KIDNEY FAILURE!!   Aspirin  Other (See Comments)    Wheezing, (Pt states that she just wheezes some when she takes aspirin  by itself but she can take aspirin  in a combination product).   Contrast Media [Iodinated Contrast Media] Other (See Comments)    Multiple CT contrast studies done over 2 weeks caused ARF   Ciprofloxacin Itching and Rash   Cyclobenzaprine  Other (See Comments)    Can not tolerate   Farxiga  [Dapagliflozin] Rash   Sulfa Antibiotics Itching and Rash     Current Outpatient Medications  Medication Sig Dispense Refill   acetaminophen  (TYLENOL ) 650 MG CR tablet Take 650 mg by mouth every 8 (eight) hours as needed for pain (headache).     albuterol  (VENTOLIN  HFA) 108 (90 Base) MCG/ACT inhaler Inhale 1-2 puffs into the lungs every 6 (six) hours as needed for wheezing or shortness of breath. 18 g 0   amiodarone  (PACERONE ) 200 MG  tablet Take 1 tablet (200 mg total) by mouth daily. 90 tablet 3   budesonide -formoterol  (SYMBICORT ) 160-4.5 MCG/ACT inhaler Inhale 2 puffs into the lungs 2 (two) times daily as needed. 10 g 3   dicyclomine  (BENTYL ) 20 MG tablet Take 1 tablet (20 mg total) by mouth daily as needed for spasms. 90 tablet 3   digoxin  (LANOXIN ) 0.125 MG tablet Take 1 tablet (0.125 mg total) by mouth daily. 30 tablet 6   fluticasone  (FLONASE ) 50 MCG/ACT nasal spray Place 2 sprays into both nostrils daily as needed for allergies. 16 g 3   gabapentin  (NEURONTIN ) 300 MG capsule Take 1 capsule (300 mg total) by mouth at bedtime as needed (back spasms). 90 capsule 3   ipratropium (ATROVENT ) 0.02 % nebulizer solution Inhale 500 mcg into the lungs.     ipratropium-albuterol  (DUONEB) 0.5-2.5 (3) MG/3ML SOLN USE 1 AMPULE IN NEBULIZER EVERY 6 HOURS AS NEEDED 360 mL 0   ivabradine  (CORLANOR ) 7.5 MG TABS tablet Take 1 tablet (7.5 mg total) by mouth 2 (two) times daily with a meal. 60 tablet 11   JARDIANCE  25 MG TABS tablet TAKE 1 TABLET BY MOUTH ONCE DAILY BEFORE BREAKFAST 90 tablet 0   Magnesium  Oxide 200 MG TABS Take 1 tablet (200 mg total) by mouth daily. 90 tablet 3   metolazone  (ZAROXOLYN ) 2.5 MG tablet  TAKE 1 TABLET BY MOUTH AS DIRECTED 5 tablet 1   metoprolol  succinate (TOPROL -XL) 100 MG 24 hr tablet Take 1 tablet (100 mg total) by mouth daily. Take with or immediately following a meal. 90 tablet 3   mexiletine (MEXITIL ) 150 MG capsule Take 2 capsules (300 mg total) by mouth 2 (two) times daily. 120 capsule 11   Multiple Vitamin (MULTI-VITAMIN) tablet Take 1 tablet by mouth daily.     Multiple Vitamins-Minerals (MULTIVITAMIN GUMMIES WOMENS PO) Take 2 tablets by mouth daily.     naproxen  sodium (ALEVE ) 220 MG tablet Take 220-440 mg by mouth 2 (two) times daily as needed (Pain).     nitroGLYCERIN  (NITROSTAT ) 0.4 MG SL tablet Place 1 tablet (0.4 mg total) under the tongue every 5 (five) minutes as needed for chest pain. 75 tablet  0   potassium chloride  SA (KLOR-CON  M) 20 MEQ tablet Take 3 tablets (60 mEq total) by mouth 2 (two) times daily. 180 tablet 6   sacubitril -valsartan  (ENTRESTO ) 97-103 MG Take 1 tablet by mouth twice daily 180 tablet 3   spironolactone  (ALDACTONE ) 25 MG tablet Take 1 tablet (25 mg total) by mouth at bedtime. 90 tablet 3   tizanidine  (ZANAFLEX ) 2 MG capsule Take 2 mg by mouth as needed for muscle spasms.     torsemide  (DEMADEX ) 20 MG tablet Take 4 tablets (80 mg total) by mouth 2 (two) times daily. (Patient taking differently: Take 40 mg by mouth 2 (two) times daily.) 240 tablet 6   Ubrogepant  (UBRELVY ) 100 MG TABS Take 1 tablet (100 mg total) by mouth daily as needed (migraine). 30 tablet 3   UNABLE TO FIND Take 1,250 mcg by mouth.     Vitamin D , Ergocalciferol , (DRISDOL ) 1.25 MG (50000 UNIT) CAPS capsule Take 1 capsule by mouth once a week 12 capsule 0   No current facility-administered medications for this visit.     Past Medical History:  Diagnosis Date   Acute on chronic systolic (congestive) heart failure (HCC) 04/29/2017   Acute pain of right shoulder 06/16/2017   AICD (automatic cardioverter/defibrillator) present    Anginal pain (HCC)    Anxiety    Arthritis    right shoulder    Asthma    CHF (congestive heart failure) (HCC)    Chronic combined systolic and diastolic heart failure (HCC) 03/05/2014   Closed low lateral malleolus fracture 10/23/2013   Cystitis 10/21/2017   Depression    Depression with anxiety 01/20/2013   Diabetes mellitus without complication (HCC)    Diverticulosis    Dyspnea    comes and goes intermittently mostly with exertion    Dysrhythmia    Essential hypertension    Prev followed by [name]/ Cardiology    Fibroid    age 38   Gallstones    Generalized abdominal cramping    History of cardiomyopathy    Hypertension    IBS (irritable bowel syndrome)    ICD (implantable cardioverter-defibrillator), single, 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 CPAP intolerance >> smothering Good  tolerance of auto device (optimal pressure 12-13 on download).  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   Past Surgical History:  Procedure Laterality Date   BREAST BIOPSY Right 09/30/2022   US  RT BREAST BX W LOC DEV 1ST LESION IMG BX SPEC US  GUIDE 09/30/2022 [facility]MO[facility]APHY   CARDIAC CATHETERIZATION N/A 11/11/2015   Procedure: Right/Left Heart Cath and Coronary Angiography;  Surgeon: Millicent Ally, MD;  Location: [facility];  Service: Cardiovascular;  Laterality: N/A;   CARDIAC CATHETERIZATION  ~ 2015   CARDIAC DEFIBRILLATOR PLACEMENT  08/03/2016   CESAREAN SECTION  1999   COLONOSCOPY WITH PROPOFOL  N/A 04/21/2016   Procedure: COLONOSCOPY WITH PROPOFOL ;  Surgeon: Nannette Babe, MD;  Location: [facility];  Service: Gastroenterology;  Laterality: N/A;   ESOPHAGOGASTRODUODENOSCOPY (EGD) WITH PROPOFOL  N/A 04/21/2016   Procedure: ESOPHAGOGASTRODUODENOSCOPY (EGD) WITH PROPOFOL ;  Surgeon: Nannette Babe, MD;  Location: [facility];  Service: Gastroenterology;  Laterality: N/A;   FOOT FRACTURE SURGERY  Right ~ 2003   FRACTURE SURGERY     ICD IMPLANT N/A 08/03/2016   Procedure: ICD Implant;  Surgeon: Verona Goodwill, MD;  Location: [facility] INVASIVE CV LAB;  Service: Cardiovascular;  Laterality: N/A;   IR ANGIOGRAM PELVIS SELECTIVE OR SUPRASELECTIVE  10/28/2022   IR ANGIOGRAM SELECTIVE EACH ADDITIONAL VESSEL  10/28/2022   IR ANGIOGRAM SELECTIVE EACH ADDITIONAL VESSEL  10/28/2022   IR ANGIOGRAM SELECTIVE EACH ADDITIONAL VESSEL  10/28/2022   IR EMBO TUMOR ORGAN ISCHEMIA INFARCT INC GUIDE ROADMAPPING  10/28/2022   IR US  GUIDE VASC ACCESS LEFT  10/28/2022   IR US  GUIDE VASC ACCESS RIGHT  10/28/2022   LAPAROSCOPIC CHOLECYSTECTOMY  12/2006   LAPAROSCOPIC GASTRIC SLEEVE RESECTION     LAPAROSCOPY ABDOMEN DIAGNOSTIC  2008   cut bile duct w/gallbladder OR; had to go in later & fix leak; hospitalized for 2 months   LEFT HEART CATHETERIZATION WITH CORONARY ANGIOGRAM N/A 02/26/2014   Procedure: LEFT HEART CATHETERIZATION WITH CORONARY ANGIOGRAM;  Surgeon: Lucendia Rusk, MD;  Location: [facility] CATH LAB;  Service: Cardiovascular;  Laterality: N/A;   OVARIAN CYST REMOVAL Right 1999   PVC ABLATION N/A 12/01/2017   Procedure: PVC ABLATION;  Surgeon: Tammie Fall, MD;  Location: [facility];  Service: Cardiovascular;  Laterality: N/A;   RIGHT HEART CATH N/A 08/05/2017   Procedure: RIGHT HEART CATH;  Surgeon: Mardell Shade, MD;  Location: [facility];  Service: Cardiovascular;  Laterality: N/A;   RIGHT HEART CATH N/A 11/16/2019   Procedure: RIGHT HEART CATH;  Surgeon: Mardell Shade, MD;  Location: [facility];  Service: Cardiovascular;  Laterality: N/A;   TUBAL LIGATION  1999     Family History  Problem Relation Age of Onset   Emphysema Maternal Grandmother        smoked   Heart disease Maternal Grandmother 47       MI   Rheum arthritis Mother    Allergies Daughter    Colon cancer Neg Hx  Social History   Socioeconomic History   Marital status: Married    Spouse name: Allayne Arabian    Number of children: 1   Years of education: Not on file   Highest education level: Associate degree: academic program  Occupational History   Occupation: stay at home mom  Tobacco Use   Smoking status: Never    Passive exposure: Never   Smokeless tobacco: Never  Vaping Use   Vaping status: Never Used  Substance and Sexual Activity   Alcohol use: No   Drug use: No   Sexual activity: Yes    Birth control/protection: Surgical, Pill    Comment: BTL  Other Topics Concern   Not on file  Social History Narrative   Lives with husband, daughter niece and great nephew   Social Drivers of Corporate investment banker Strain: Low Risk  (06/08/2023)   Overall Financial Resource Strain (CARDIA)    Difficulty of Paying Living Expenses: Not very hard  Food Insecurity: No Food Insecurity (06/08/2023)   Hunger Vital Sign    Worried About Running Out of Food in the Last Year: Never true    Ran Out of Food in the Last Year: Never true  Transportation Needs: Unmet Transportation Needs (06/08/2023)   PRAPARE - Administrator, Civil Service (Medical): Yes    Lack of Transportation (Non-Medical): No  Physical Activity: Insufficiently Active (06/08/2023)   Exercise Vital Sign    Days of Exercise per Week: 1 day    Minutes of Exercise per Session: 10 min  Stress: Stress Concern Present (06/08/2023)   [employer] of Occupational Health - Occupational Stress Questionnaire    Feeling of Stress : Very much  Social Connections: Socially Isolated (06/08/2023)   Social Connection and Isolation Panel    Frequency of Communication with Friends and Family: Never    Frequency of Social Gatherings with Friends and Family: Never    Attends Religious Services: Never    Database administrator or Organizations: No    Attends Banker Meetings: Never    Marital Status: Married  Catering manager Violence: Not At Risk (05/06/2023)   Humiliation, Afraid, Rape, and Kick questionnaire     Fear of Current or Ex-Partner: No    Emotionally Abused: No    Physically Abused: No    Sexually Abused: No     BP 112/72   Pulse 99   Ht 5' 5 (1.651 m)   Wt 218 lb (98.9 kg)   LMP  (LMP Unknown)   SpO2 99%   BMI 36.28 kg/m   Physical Exam:  Well appearing NAD HEENT: Unremarkable Neck:  No JVD, no thyromegally Lymphatics:  No adenopathy Back:  No CVA tenderness Lungs:  Clear with no wheezes HEART:  Regular rate rhythm, no murmurs, no rubs, no clicks Abd:  soft, positive bowel sounds, no organomegally, no rebound, no guarding Ext:  2 plus pulses, no edema, no cyanosis, no clubbing Skin:  No rashes no nodules Neuro:  CN II through XII intact, motor grossly intact  DEVICE  Normal device function.  See PaceArt for details.   Assess/Plan: PVC's - at this point the real question as it pertains to another ablation is whether she is having enough PVC's to justify this. I have recommended she repeat the Zio moitor. If over 20% burden, I would recommend a repeat PVC ablation. If under 10%, I would recommend watchful waiting. If between 10-20%, I think you could go either way. I  do not think her PVC's are causing her cardiomyopathy but could certainly be making it worse.  Chronic systolic heart failure - She will continue GDMT. No change in her meds. Obesity - not helping the heart failure.   Pete Brand Margalit Leece,MD

## 2023-11-10 NOTE — Progress Notes (Unsigned)
 Enrolled patient for a 3 day Zio XT monitor to be mailed to patients home

## 2023-11-10 NOTE — Patient Instructions (Addendum)
 Medication Instructions:  Your physician recommends that you continue on your current medications as directed. Please refer to the Current Medication list given to you today.  *If you need a refill on your cardiac medications before your next appointment, please call your pharmacy*  Lab Work: None ordered.  You may go to any [facility] Location for your lab work:  [facility] - [address] ([facility]) - [address] [phone] [address]  [place] - [address]   [place]  - [address]   [place] - [address] - [address] [employer]  - [address] [place] - [address] ([facility]   If you have labs (blood work) drawn today and your tests are completely normal, you will receive your results only by: [employer] (if you have MyChart)  If you have any lab test that is abnormal or we need to change your treatment, we will call you or send a MyChart message to review the results.  Testing/Procedures: [name]- Long Term Monitor Instructions  Your physician has requested you wear a ZIO patch monitor for 3 days.   This is a single patch monitor. [employer] supplies one patch monitor per enrollment. Additional  stickers are not available. Please do not apply patch if you will be having a Nuclear Stress Test,  Echocardiogram, Cardiac CT, MRI, or Chest Xray during the period you would be wearing the  monitor. The patch cannot be worn during these tests. You cannot remove and re-apply the  ZIO XT patch monitor.   Your ZIO patch monitor will be mailed 3 day USPS to your address on file. It may take 3-5 days  to receive your monitor after you have been enrolled.   Once you have received your monitor, please review the enclosed instructions. Your monitor  has already been registered assigning a specific monitor serial # to you.     Billing and Patient Assistance  Program Information  We have supplied [employer] with any of your insurance information on file for billing purposes.  [employer] offers a sliding scale Patient Assistance Program for patients that do not have  insurance, or whose insurance does not completely cover the cost of the ZIO monitor.  You must apply for the Patient Assistance Program to qualify for this discounted rate.   To apply, please call [employer] at [phone], select option 4, select option 2, ask to apply for  Patient Assistance Program. [name] will ask your household income, and how many people  are in your household. They will quote your out-of-pocket cost based on that information.  [employer] will also be able to set up a 21-month, interest-free payment plan if needed.     Applying the monitor  Shave hair from upper left chest.  Hold abrader disc by orange tab. Rub abrader in 40 strokes over the upper left chest as  indicated in your monitor instructions.  Clean area with 4 enclosed alcohol pads. Let dry.  Apply patch as indicated in monitor instructions. Patch will be placed under collarbone on left  side of chest with arrow pointing upward.  Rub patch adhesive wings for 2 minutes. Remove white label marked 1. Remove the white  label marked 2. Rub patch adhesive wings for 2 additional minutes.  While looking in a mirror, press and release button in center of patch. A small green light  will  flash 3-4 times. This will be your only indicator that the monitor has been turned on.  Do not shower for the first 24 hours. You may shower after the first 24 hours.  Press the button if you feel a symptom. You will hear a small click. Record Date, Time and  Symptom in the Patient Logbook.  When you are ready to remove the patch, follow instructions on the last 2 pages of Patient  Logbook. Stick patch monitor onto the last page of Patient Logbook.   Place Patient Logbook in the blue and white box. Use locking tab on box and tape  box closed  securely. The blue and white box has prepaid postage on it. Please place it in the mailbox as  soon as possible. Your physician should have your test results approximately 7 days after the  monitor has been mailed back to [facility].   Call [facility] Customer Care at [phone] if you have questions regarding  your ZIO XT patch monitor. Call them immediately if you see an orange light blinking on your  monitor.   If your monitor falls off in less than 4 days, contact our Monitor department at [phone].   If your monitor becomes loose or falls off after 4 days call [employer] at [phone] for  suggestions on securing your monitor.     Follow-Up: At [facility], you and your health needs are our priority.  As part of our continuing mission to provide you with exceptional heart care, we have created designated Provider Care Teams.  These Care Teams include your primary Cardiologist (physician) and Advanced Practice Providers (APPs -  Physician Assistants and Nurse Practitioners) who all work together to provide you with the care you need, when you need it.  Your next appointment:   Based on testing results  The format for your next appointment:   In Person  Provider:   Manya Sells, MD{or one of the following Advanced Practice Providers on your designated Care Team:   Mertha Abrahams, [place] Merla Starch, [place] Neda Balk, NP  Note: Remote monitoring is used to monitor your Pacemaker/ ICD from home. This monitoring reduces the number of office visits required to check your device to one time per year. It allows us  to keep an eye on the functioning of your device to ensure it is working properly.

## 2023-11-11 ENCOUNTER — Ambulatory Visit
Admission: RE | Admit: 2023-11-11 | Discharge: 2023-11-11 | Disposition: A | Discharge status: Home / Self Care | Source: Ambulatory Visit | Attending: Internal Medicine | Admitting: Internal Medicine

## 2023-11-11 ENCOUNTER — Ambulatory Visit

## 2023-11-11 DIAGNOSIS — R928 Other abnormal and inconclusive findings on diagnostic imaging of breast: Secondary | ICD-10-CM

## 2023-11-14 NOTE — Progress Notes (Signed)
 Advanced [facility] Failure Clinic Note   PCP: Rollene Almarie LABOR, MD Primary Cardiologist: [name], MD (Inactive)  HF Cardiologist: Dr. 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   Admitted 7/19 with CP Symptoms thought to be related to frequent PVCs. EP consulted and scheduled her for PVC ablation. Echo repeated 11/22/17 and showed improved EF 50-55%. S/p  PVC ablation 12/01/17. Was felt not to be successful.  Was on flecainide  but discontinued due to reduced EF. Placed on amio   Echo 6/20 showed drop in EF back down to 25-30%. Was instructed to f/u in clinic but pt did not. She had outpatient monitor 04/2018 that showed rare PVCs.    Admitted 6/21 with chest pain and shortness of breath.ECHO EF < 20% and normal RV. Had cath with normal cors and preserved cardiac output. Plan was to f/u with EP regarding PVCs. She has remained on AAD therapy w/ amiodarone  + ? blocker therapy.  Admitted to [facility] 4/22 w/ increased dyspnea and volume overload. Also w/ increased PVC burden. Echo showed EF 20-25%, RV normal. She was diuresed w/ IV Lasix  and placed on amiodarone  gtt for PVC suppression. Seen by Dr. Waddell, due to the location of her PVCs, she is not a candidate for re-do ablation.   S/p Barostim 6/22.  Zio 8/22: 13% PVCs (unifocal)  Seen by EP 12/22 and Barostim titrated. Not felt to have any other options for PVCs.   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  -> mexilitene increased to 300 bid  Cardiac PET 6/24 no ischemia/infarction. No active myocardial inflammation/ evidence of sarcoidosis. 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 Had f/u with Dr. Waddell (EP). 11/02/23 Zio placed to re-quantify PVC burden If > 20% plan repeat PVC ablation. < 10% medical rx. If 10-20% would discuss options  Today she returns for HF follow up. Remains fatigued. Can do all ADLs if she takes her time. Winded with steps but can do them. Fluid doing pretty well.   ICD interrogation: HL score 6. No VT/AF. Fluid ok Activity level 0.8/hr. Personally reviewed   Cardiac Studies   - Cardiac PET (6/24):    FDG uptake was not observed. LV perfusion is normal. There is no evidence of ischemia. There is no evidence of infarction.   Coronary calcium was absent on the attenuation correction CT images.   FDG uptake findings are inconsistent with active myocardial inflammation/sarcoidosis.   No gated images or EF due to interference from AICD leads  - Echo (7/23): EF 25-30%  - Echo (1/23): EF 25-30% RV normal   - Echo (4/22): EF 20-25%, RV normal   - PFTs w/ DLOC (4/22) Mild Restriction Normal Diffusion  - CPX (7/21) FVC 2.32 (70%)      FEV1 1.99 (74%)        FEV1/FVC 86 (105%)        MVV 110 (105%) Resting HR: 94 Peak HR: 175   (100% age predicted max HR) BP rest: 126/82 BP peak: 202/78 Peak VO2: 17.6 (86% predicted peak VO2) -  corrects to 28.4 for ibw VE/VCO2 slope:  37 OUES: 1.74 Peak RER: 1.08 Ventilatory Threshold: 14.1 (69% predicted or measured peak VO2) VE/MVV:  53% PETCO2 at peak:   O2pulse:  10   (83% predicted O2pulse)  - RHC (6/21):   RA = 4 RV = 32/6 PA = 31/8 (21) PCW = 12 Fick cardiac output/index = 7.0/3.4 Thermo CO/CI = 5.2/2.5 PVR =1.7 (Thermo) Ao sat = 98% PA sat = 67%, 69%  - Echo (6/21): EF < 20% RV normal   - Echo (6/20): EF 25-30%  - Echo (7/19): EF 50-55%  - CPX (12/18): pVO2 14.0 (corrects to 23.0 for IBW) VeVCO2  32 RER 1.0  - Echo (8/18): EF 25%   - CPX (2/18): FVC 2.57 (79%)      FEV1 2.14 (81%)        FEV1/FVC 83 (101%)        MVV 107 (102%) Resting HR:  106 Peak HR: 166   (93% age predicted max HR) BP rest: 136/98 BP peak: 174/88 Peak VO2: 17.1 (85% predicted peak VO2) - corrects to 28.4 for ibw VE/VCO2 slope:  33 OUES: 2.16 Peak RER: 1.09 Ventilatory Threshold: 14.6 (73% predicted or measured peak VO2) VE/MVV:  59% PETCO2 at peak:  33 O2pulse:  10   (83% predicted O2pulse)  - Echo (12/17) 20-25%  - Cath (6/17): revealed normal vessels, moderate elevation in pulmonary artery pressures, and LVEF 25-30%.  - Echo (6/17): 25-30%  - Echo (6/15): EF 35-40%   Review of systems complete and found to be negative unless listed in HPI.    Past Medical History:  Diagnosis Date   Acute on chronic systolic (congestive) heart failure (HCC) 04/29/2017   Acute pain of right shoulder 06/16/2017   AICD (automatic cardioverter/defibrillator) present    Anginal pain (HCC)    Anxiety    Arthritis    right shoulder    Asthma    CHF (congestive heart failure) (HCC)    Chronic combined systolic and diastolic heart failure (HCC) 03/05/2014   Closed low lateral malleolus fracture 10/23/2013   Cystitis 10/21/2017   Depression    Depression with anxiety 01/20/2013   Diabetes mellitus without complication (HCC)    Diverticulosis    Dyspnea    comes and goes intermittently mostly with exertion    Dysrhythmia    Essential hypertension    Prev followed by [name]    Fibroid    age 66   Gallstones    Generalized abdominal cramping    History of cardiomyopathy    Hypertension    IBS (irritable bowel syndrome)    ICD (implantable cardioverter-defibrillator), single, 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   Insomnia 05/07/2017   Labral tear of shoulder 04/04/2015    Injected 04/04/2015 Injected 12/03/2015    Migraine    monthly (08/03/2016)   Myofascial pain 06/16/2017   NICM (nonischemic cardiomyopathy) (HCC) 08/03/2016   Nonallopathic lesion of lumbosacral region 11/16/2016   Nonallopathic lesion of sacral region 11/16/2016   Nonallopathic lesion  of thoracic region 08/20/2014   Nonspecific chest pain 04/28/2017   OSA (obstructive sleep apnea) 01/02/2013   NPSG 2009:  AHI 9/hr. CPAP intolerance >> smothering Good tolerance of auto device (optimal pressure 12-13 on download).  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     albuterol  (VENTOLIN  HFA) 108 (90 Base) MCG/ACT inhaler Inhale 1-2 puffs into the lungs every 6 (six) hours as needed for wheezing or shortness of breath. 18 g 0   amiodarone  (PACERONE ) 200 MG tablet Take 1 tablet (200 mg total) by mouth daily. 90 tablet 3   budesonide -formoterol  (SYMBICORT ) 160-4.5 MCG/ACT inhaler Inhale 2 puffs into the lungs 2 (two) times daily as needed. 10 g 3   dicyclomine  (BENTYL ) 20 MG tablet Take 1 tablet (20 mg total) by mouth daily as needed for spasms. 90 tablet 3   digoxin  (LANOXIN ) 0.125 MG tablet Take 1 tablet (0.125 mg total) by mouth daily. 30 tablet 6   fluticasone  (FLONASE ) 50 MCG/ACT nasal spray Place 2 sprays into both nostrils daily as needed for allergies. 16 g 3   gabapentin  (NEURONTIN ) 300 MG capsule Take 1 capsule (300 mg total) by mouth at bedtime as needed (back spasms). 90 capsule 3   ipratropium (ATROVENT ) 0.02 % nebulizer solution Inhale 500 mcg into the lungs.     ipratropium-albuterol  (DUONEB) 0.5-2.5 (3) MG/3ML SOLN USE 1 AMPULE IN NEBULIZER EVERY 6 HOURS AS NEEDED 360 mL 0   ivabradine  (CORLANOR ) 7.5 MG TABS tablet Take 1 tablet (7.5 mg total) by mouth 2 (two) times daily with a meal. 60  tablet 11   JARDIANCE  25 MG TABS tablet TAKE 1 TABLET BY MOUTH ONCE DAILY BEFORE BREAKFAST 90 tablet 0   Magnesium  Oxide 200 MG TABS Take 1 tablet (200 mg total) by mouth daily. 90 tablet 3   metolazone  (ZAROXOLYN ) 2.5 MG tablet TAKE 1 TABLET BY MOUTH AS DIRECTED 5 tablet 1   metoprolol  succinate (TOPROL -XL) 100 MG 24 hr tablet Take 1 tablet (100 mg total) by mouth daily. Take with or immediately following a meal. 90 tablet 3   mexiletine (MEXITIL ) 150 MG capsule Take 2 capsules (300 mg total) by mouth 2 (two) times daily. 120 capsule 11   Multiple Vitamin (MULTI-VITAMIN) tablet Take 1 tablet by mouth daily.     Multiple Vitamins-Minerals (MULTIVITAMIN GUMMIES WOMENS PO) Take 2 tablets by mouth daily.     naproxen  sodium (ALEVE ) 220 MG tablet Take 220-440 mg by mouth 2 (two) times daily as needed (Pain).     nitroGLYCERIN  (NITROSTAT ) 0.4 MG SL tablet Place 1 tablet (0.4 mg total) under the tongue every 5 (five) minutes as needed for chest pain. 75 tablet 0   potassium chloride  SA (KLOR-CON  M) 20 MEQ tablet Take 3 tablets (60 mEq total) by mouth 2 (two) times daily. 180 tablet 6   sacubitril -valsartan  (ENTRESTO ) 97-103 MG Take 1 tablet by mouth twice daily 180 tablet 3   spironolactone  (ALDACTONE ) 25 MG tablet Take 1 tablet (25 mg total) by mouth at bedtime. 90 tablet 3   tizanidine  (ZANAFLEX ) 2 MG capsule Take 2 mg by mouth as needed for muscle spasms.     torsemide  (DEMADEX ) 20 MG tablet Take 40 mg by mouth 2 (two) times daily.     Ubrogepant  (UBRELVY ) 100 MG TABS Take 1 tablet (100 mg total) by mouth daily as needed (migraine). 30 tablet 3   Vitamin D ,  Ergocalciferol , (DRISDOL ) 1.25 MG (50000 UNIT) CAPS capsule Take 1 capsule by mouth once a week 12 capsule 0   UNABLE TO FIND Take 1,250 mcg by mouth.     No current facility-administered medications for this encounter.   Allergies  Allergen Reactions   Vancomycin  Other (See Comments)    did something to my kidneys, PROGRESSED TO KIDNEY  FAILURE!!   Aspirin  Other (See Comments)    Wheezing, (Pt states that she just wheezes some when she takes aspirin  by itself but she can take aspirin  in a combination product).   Contrast Media [Iodinated Contrast Media] Other (See Comments)    Multiple CT contrast studies done over 2 weeks caused ARF   Ciprofloxacin Itching and Rash   Cyclobenzaprine  Other (See Comments)    Can not tolerate   Farxiga  [Dapagliflozin] Rash   Sulfa Antibiotics Itching and Rash   Social History   Socioeconomic History   Marital status: Married    Spouse name: Beryl   Number of children: 1   Years of education: Not on file   Highest education level: Associate degree: academic program  Occupational History   Occupation: stay at home mom  Tobacco Use   Smoking status: Never    Passive exposure: Never   Smokeless tobacco: Never  Vaping Use   Vaping status: Never Used  Substance and Sexual Activity   Alcohol use: No   Drug use: No   Sexual activity: Yes    Birth control/protection: Surgical, Pill    Comment: BTL  Other Topics Concern   Not on file  Social History Narrative   Lives with husband, daughter niece and great nephew   Social Drivers of Corporate investment banker Strain: Low Risk  (06/08/2023)   Overall Financial Resource Strain (CARDIA)    Difficulty of Paying Living Expenses: Not very hard  Food Insecurity: No Food Insecurity (06/08/2023)   Hunger Vital Sign    Worried About Running Out of Food in the Last Year: Never true    Ran Out of Food in the Last Year: Never true  Transportation Needs: Unmet Transportation Needs (06/08/2023)   PRAPARE - Administrator, Civil Service (Medical): Yes    Lack of Transportation (Non-Medical): No  Physical Activity: Insufficiently Active (06/08/2023)   Exercise Vital Sign    Days of Exercise per Week: 1 day    Minutes of Exercise per Session: 10 min  Stress: Stress Concern Present (06/08/2023)   [employer] of Occupational  Health - Occupational Stress Questionnaire    Feeling of Stress : Very much  Social Connections: Socially Isolated (06/08/2023)   Social Connection and Isolation Panel    Frequency of Communication with Friends and Family: Never    Frequency of Social Gatherings with Friends and Family: Never    Attends Religious Services: Never    Database administrator or Organizations: No    Attends Banker Meetings: Never    Marital Status: Married  Catering manager Violence: Not At Risk (05/06/2023)   Humiliation, Afraid, Rape, and Kick questionnaire    Fear of Current or Ex-Partner: No    Emotionally Abused: No    Physically Abused: No    Sexually Abused: No   Family History  Problem Relation Age of Onset   Emphysema Maternal Grandmother        smoked   Heart disease Maternal Grandmother 59       MI   Rheum arthritis Mother  Allergies Daughter    Colon cancer Neg Hx    BP 120/70   Pulse (!) 104   Ht 5' 5 (1.651 m)   SpO2 98%   BMI 36.28 kg/m   Wt Readings from Last 3 Encounters:  11/10/23 98.9 kg (218 lb)  10/21/23 98 kg (216 lb)  10/13/23 96.5 kg (212 lb 11.9 oz)   PHYSICAL EXAM: General:  Well appearing. No resp difficulty HEENT: normal Neck: supple. no JVD. Carotids 2+ bilat; no bruits. No lymphadenopathy or thryomegaly appreciated. Cor: PMI nondisplaced. Regular tachy  Lungs: clear Abdomen: obese soft, nontender, nondistended. No hepatosplenomegaly. No bruits or masses. Good bowel sounds. Extremities: no cyanosis, clubbing, rash, edema Neuro: alert & orientedx3, cranial nerves grossly intact. moves all 4 extremities w/o difficulty. Affect pleasant  ECG: ST 102 freq PVCs Personally reviewed  Device interrogation (personally reviewed from 07/31/33): ICD interrogation: HL score 6. No VT/AF. Fluid ok Activity level 0.8/hr. Personally reviewed  ASSESSMENT & PLAN:  1. Chronic systolic HF in setting of TTN CM:   - due to TTN CM, onset 1999.  - S/P [employer] ICD 07/2016 - s/p Barostim 6/22 - LHC 6/17 No CAD - Echo 12/17 EF 20-25% - Echo 8/18 EF 25-30% s/p ICD 3/18.  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 Suspect progressive course. Has been referred to Dr. Joseph>>Variant is interpreted as variant of unknown clinical significance. Per Dr. Thera, as there are no affected family members to determine if this variant segregates with disease, genetic testing her daughter for this familial TTN variant is unwarranted  - Cardiac PET 6/24: no ischemia/infarction. No active myocardial inflammation/ evidence of sarcoidosis - Stable NYHA III. Volume ok on torsemide  40 bid. Takes metolazone  as needed (1-2/month) - Continue digoxin  0.125 mg daily.  - Continue Jardiance  25 mg daily. - Continue Entesto 97/103 mg bid. - Continue spiro 25 mg qhs. - Continue Toprol  XL 100 mg daily  - Continue Corlanor  7.5 mg bid. - ICD interrogation: HL score 6. No VT/AF. Fluid ok Activity level 0.8/hr. Personally reviewed - Unclear if PVCs are a result of her CM or contributing to it (or both). Failed previous PVC ablation. C/w high burden despite 2 AAD therapy regimen w/ amiodarone  and mexiletine.  - Suspect she may need advanced therapies in future but currently too early.  - Will check CPX  - Refer to CR  2. Frequent PVCs/ Bigeminy - Holter Monitor 6/19 with 30% PVCs with one primary morphology.  - S/p PVC ablation 11/2017 with Dr Waddell unsuccessful - Off flecainide  with EF down and persistent PVCs.  - Zio Patch 04/2019 > 17% PVCs and >8% couplets.  - Zio Patch 11/28/2019 8.5 % PVCs - Zio 9/22 13% PVCs (unifocal)  - Zio 12/23 showed frequent NSVT, 13% PVC burden (see discussion above) - Cardiac PET 6/24 negative for sarcoid  - Saw Dr. Waddell (EP). 11/02/23 Zio placed to re-quantify PVC burden If >  20% plan repeat PVC ablation. < 10% medical rx. If 10-20% would discuss options - Continue amiodarone  200 mg daily. - Continue mexiletine 300 mg bid.   3. Morbid obesity - s/p gastric Sleeve at [facility] 09/20/17  - Body mass index is 36.28 kg/m. - Has met with [facility]  - Met with PharmD regarding GLP1RA but  insurance wouldn't pay for it   4. Daytime Fatigue - Sleep study in 2019 and again in 2021 were both negative for sleep apnea  - No change  5. DM 2 - On Jardiance   - Per PCP   [name], MD  2:15 PM

## 2023-11-15 ENCOUNTER — Encounter (HOSPITAL_COMMUNITY): Payer: Self-pay | Admitting: Internal Medicine

## 2023-11-15 ENCOUNTER — Ambulatory Visit (HOSPITAL_COMMUNITY)
Admission: RE | Admit: 2023-11-15 | Discharge: 2023-11-15 | Disposition: A | Discharge status: Home / Self Care | Source: Ambulatory Visit | Attending: Internal Medicine | Admitting: Internal Medicine

## 2023-11-15 VITALS — BP 120/70 | HR 104 | Ht 65.0 in

## 2023-11-15 DIAGNOSIS — I428 Other cardiomyopathies: Secondary | ICD-10-CM

## 2023-11-15 DIAGNOSIS — Z86718 Personal history of other venous thrombosis and embolism: Secondary | ICD-10-CM | POA: Diagnosis not present

## 2023-11-15 DIAGNOSIS — G4733 Obstructive sleep apnea (adult) (pediatric): Secondary | ICD-10-CM | POA: Insufficient documentation

## 2023-11-15 DIAGNOSIS — R008 Other abnormalities of heart beat: Secondary | ICD-10-CM | POA: Diagnosis not present

## 2023-11-15 DIAGNOSIS — Z9884 Bariatric surgery status: Secondary | ICD-10-CM | POA: Diagnosis not present

## 2023-11-15 DIAGNOSIS — Z6836 Body mass index (BMI) 36.0-36.9, adult: Secondary | ICD-10-CM | POA: Insufficient documentation

## 2023-11-15 DIAGNOSIS — I110 Hypertensive heart disease with heart failure: Secondary | ICD-10-CM | POA: Diagnosis not present

## 2023-11-15 DIAGNOSIS — Z7984 Long term (current) use of oral hypoglycemic drugs: Secondary | ICD-10-CM | POA: Diagnosis not present

## 2023-11-15 DIAGNOSIS — I493 Ventricular premature depolarization: Secondary | ICD-10-CM | POA: Diagnosis not present

## 2023-11-15 DIAGNOSIS — Z79899 Other long term (current) drug therapy: Secondary | ICD-10-CM | POA: Diagnosis not present

## 2023-11-15 DIAGNOSIS — I5022 Chronic systolic (congestive) heart failure: Secondary | ICD-10-CM | POA: Insufficient documentation

## 2023-11-15 DIAGNOSIS — E119 Type 2 diabetes mellitus without complications: Secondary | ICD-10-CM | POA: Diagnosis not present

## 2023-11-15 DIAGNOSIS — I5042 Chronic combined systolic (congestive) and diastolic (congestive) heart failure: Secondary | ICD-10-CM

## 2023-11-15 LAB — BASIC METABOLIC PANEL WITH GFR
Anion gap: 14 (ref 5–15)
BUN: 8 mg/dL (ref 6–20)
CO2: 18 mmol/L — ABNORMAL LOW (ref 22–32)
Calcium: 8.9 mg/dL (ref 8.9–10.3)
Chloride: 109 mmol/L (ref 98–111)
Creatinine, Ser: 0.83 mg/dL (ref 0.44–1.00)
GFR, Estimated: >60 mL/min (ref >60)
Glucose, Bld: 89 mg/dL (ref 70–99)
Potassium: 4.3 mmol/L (ref 3.5–5.1)
Sodium: 141 mmol/L (ref 135–145)

## 2023-11-15 LAB — BRAIN NATRIURETIC PEPTIDE: B Natriuretic Peptide: 649.5 pg/mL — ABNORMAL HIGH (ref 0.0–100.0)

## 2023-11-15 NOTE — Addendum Note (Signed)
 Encounter addended by: Buell Suann HERO, RN on: 11/15/2023 2:28 PM  Actions taken: Order list changed, Diagnosis association updated

## 2023-11-15 NOTE — Addendum Note (Signed)
 Encounter addended by: Buell Violet HERO, RN on: 11/15/2023 7:69 PM  Actions taken: Clinical Note Signed, Charge Capture section accepted

## 2023-11-15 NOTE — Patient Instructions (Signed)
 Great to see you today!!!  Medication Changes:  None, continue current medications  Lab Work:  Labs done today, your results will be available in MyChart, we will contact you for abnormal readings.  Testing/Procedures:  Your physician has recommended that you have a cardiopulmonary stress test (CPX). CPX testing is a non-invasive measurement of heart and lung function. It replaces a traditional treadmill stress test. This type of test provides a tremendous amount of information that relates not only to your present condition but also for future outcomes. This test combines measurements of you ventilation, respiratory gas exchange in the lungs, electrocardiogram (EKG), blood pressure and physical response before, during, and following an exercise protocol. SEE INSTRUCTIONS BELOW  Special Instructions // Education:  Do the following things EVERYDAY: Weigh yourself in the morning before breakfast. Write it down and keep it in a log. Take your medicines as prescribed Eat low salt foods--Limit salt (sodium) to 2000 mg per day.  Stay as active as you can everyday Limit all fluids for the day to less than 2 liters  You are scheduled for a Cardiopulmonary Exercise (CPX) Test as [facility] on: Date:      Time:   Expect to be in the lab for 2 hours. Please plan to arrive 30 minutes prior to your appointment. You may be asked to reschedule your test if you arrive 20 minutes or more after your scheduled appointment time.  Main Campus address: [address] [place], [place] [address] You may arrive to the Main Entrance A or Entrance C (free valet parking is available at both). -Main Entrance A (on [address]) :proceed to admitting for check in -Entrance C (on [facility]): proceed to [employer] parking or under hospital deck parking using this code _________  Check In: [facility] waiting room (1st floor)   General Instructions for the day of the test (Please follow all  instructions from your physician): Refrain from ingesting a heavy meal, alcohol, or caffeine  or using tobacco products within 2 hours of the test (DO NOT FAST for mare than 8 hours). You may have all other non-alcoholic, non -caffeinated beverage,a light snack (crackers,a piece of fruit, carrot sticks, toast bagel,etc) up to your appointment. Avoid significant exertion or exercise within 24 hours of your test. Be prepared to exercise and sweat. Your clothing should permit freedom of movement and include walking or running shoes. Women bring loose fitting short sleeved blouse.  This evaluation may be fatiguing and you may wish ti have someone accompany you to the assessment to drive you home afterward. Bring a list of your medications with you, including dosage and frequency you take the medications (  I.e.,once per day, twice per day, etc). Take all medications as prescribed, unless noted below or instructed to do so by your physician.  Please do not take the following medications prior to your CPX:  _________________________________________________  _________________________________________________  Brief description of the test: A brief lung test will be performed. This will involve you taking deep breaths and blowing hard and fast through your mouth. During these , a clip will be on your nose and you will be breathing through a breathing device.   For the exercise portion of the test you will be walking on a treadmill, or riding a stationary bike, to your maximal effor or until symptoms such as chest pain, shortness of breath, leg pain or dizziness limit your exercise. You will be breathing in and out of a breathing device  through your mouth (a clip will be on your nose again). Your heart rate, ECG, blood pressure, oxygen  saturations, breathing rate and depth, amount of oxygen  you consume and amount of carbon dioxide you produce will be measured and monitored throughout the exercise test.  If you  need to cancel or reschedule your appointment please call [phone] If you have further questions please call your physician or Damien Nunnery at [phone]   Follow-Up in: 4 months   At the [facility], you and your health needs are our priority. We have a designated team specialized in the treatment of Heart Failure. This Care Team includes your primary Heart Failure Specialized Cardiologist (physician), Advanced Practice Providers (APPs- Physician Assistants and Nurse Practitioners), and Pharmacist who all work together to provide you with the care you need, when you need it.   You may see any of the following providers on your designated Care Team at your next follow up:  Dr. [name] Dr. Ezra Shuck Dr. Ria Commander Dr. Odis Brownie Greig Mosses, NP Caffie Shed, [place] [facility] [place], [place] Beckey Coe, NP [place] Lee, NP Tinnie Redman, PharmD   Please be sure to bring in all your medications bottles to every appointment.   Need to Contact Us :  If you have any questions or concerns before your next appointment please send us  a message through [place] or call our office at [phone].    TO LEAVE A MESSAGE FOR THE NURSE SELECT OPTION 2, PLEASE LEAVE A MESSAGE INCLUDING: YOUR NAME DATE OF BIRTH CALL BACK NUMBER REASON FOR CALL**this is important as we prioritize the call backs  YOU WILL RECEIVE A CALL BACK THE SAME DAY AS LONG AS YOU CALL BEFORE 4:00 PM

## 2023-11-18 ENCOUNTER — Encounter: Payer: Self-pay | Admitting: Internal Medicine

## 2023-11-18 ENCOUNTER — Telehealth (HOSPITAL_COMMUNITY): Payer: Self-pay | Admitting: Cardiology

## 2023-11-18 NOTE — Telephone Encounter (Signed)
 Pt presents to office for itamar sleep kit  ITAMAR home sleep study given to patient, all instructions explained, waiver signed, and CLOUDPAT registration complete. SAN [id number]   Your provider has recommended that you have a home sleep study (Itamar Test).  We have provided you with the equipment in our office today. Please go ahead and download the app. DO NOT OPEN OR TAMPER WITH THE BOX UNTIL WE ADVISE YOU TO DO SO. Once insurance has approved the test our office will call you with PIN number and approval to proceed with testing. Once you have completed the test you just dispose of the equipment, the information is automatically uploaded to us  via blue-tooth technology. If your test is positive for sleep apnea and you need a home CPAP machine you will be contacted by Dr Dorine office [facility]) to set this up.   STOP BANG RISK ASSESSMENT S (snore) Have you been told that you snore?     YES/   T (tired) Are you often tired, fatigued, or sleepy during the day?   YES/  O (obstruction) Do you stop breathing, choke, or gasp during sleep? NO   P (pressure) Do you have or are you being treated for high blood pressure? YES   B (BMI) Is your body index greater than 35 kg/m? YES   A (age) Are you 38 years old or older? NO   N (neck) Do you have a neck circumference greater than 16 inches?   NO   G (gender) Are you a female? /NO   TOTAL STOP/BANG "YES" ANSWERS 4                                                                       For Office Use Only              Procedure Order Form    YES to 3+ Stop Bang questions OR two clinical symptoms - patient qualifies for WatchPAT (CPT 95800)             Clinical Notes: Will consult Sleep Specialist and refer for management of therapy due to patient increased risk of Sleep Apnea. Ordering a sleep study due to the following two clinical symptoms: Excessive daytime sleepiness G47.10 / Gastroesophageal reflux K21.9 / Nocturia R35.1 / Morning  Headaches G44.221 / Difficulty concentrating R41.840 / Memory problems or poor judgment G31.84 / Personality changes or irritability R45.4 / Loud snoring R06.83 / Depression F32.9 / Unrefreshed by sleep G47.8 / Impotence N52.9 / History of high blood pressure R03.0 / Insomnia G47.00    I understand that I am proceeding with a home sleep apnea test as ordered by my treating physician. I understand that untreated sleep apnea is a serious cardiovascular risk factor and it is my responsibility to perform the test and seek management for sleep apnea. I will be contacted with the results and be managed for sleep apnea by a local sleep physician. I will be receiving equipment and further instructions from [facility]. I shall promptly ship back the equipment via the included mailing label. I understand my insurance will be billed for the test and as the patient I am responsible for any insurance related out-of-pocket costs incurred. I have been provided  with written instructions and can call for additional video or telephonic instruction, with 24-hour availability of qualified personnel to answer any questions: Patient Help Desk [phone].  Patient Signature ______________________________________________________   Date______________________ Patient Telemedicine Verbal Consent

## 2023-11-20 ENCOUNTER — Other Ambulatory Visit (HOSPITAL_COMMUNITY): Payer: Self-pay | Admitting: Internal Medicine

## 2023-11-20 DIAGNOSIS — I5042 Chronic combined systolic (congestive) and diastolic (congestive) heart failure: Secondary | ICD-10-CM

## 2023-11-21 ENCOUNTER — Other Ambulatory Visit: Payer: Self-pay | Admitting: Internal Medicine

## 2023-11-22 NOTE — Progress Notes (Unsigned)
 [name] [address] [place] [address] Phone: [phone] Subjective:   [name], am serving as a scribe for Dr. Arthea Claudene.  I'm seeing this patient by the request  of:  Rollene Almarie LABOR, MD  CC: Back and neck pain  [name]  [name] is a 48 y.o. female coming in with complaint of back and neck pain. OMT on 03/09/2023. Patient states that she has been having pain in the L trap and SI joint. A few months ago she fell out of shower landing on backside. Wonders if this caused spine to shift. Massage seems to help over SI joint. Denies any radiating symptoms. Using muscle relaxer, IBU and Tylenol .   Medications patient has been prescribed:   Taking:      Patient is scheduled for ablation of PVCs July 11.   Reviewed prior external information including notes and imaging from previsou exam, outside providers and external EMR if available.   As well as notes that were available from care everywhere and other healthcare systems.  Past medical history, social, surgical and family history all reviewed in electronic medical record.  No pertanent information unless stated regarding to the chief complaint.   Past Medical History:  Diagnosis Date   Acute on chronic systolic (congestive) heart failure (HCC) 04/29/2017   Acute pain of right shoulder 06/16/2017   AICD (automatic cardioverter/defibrillator) present    Anginal pain (HCC)    Anxiety    Arthritis    right shoulder    Asthma    CHF (congestive heart failure) (HCC)    Chronic combined systolic and diastolic heart failure (HCC) 03/05/2014   Closed low lateral malleolus fracture 10/23/2013   Cystitis 10/21/2017   Depression    Depression with anxiety 01/20/2013   Diabetes mellitus without complication (HCC)    Diverticulosis    Dyspnea    comes and goes intermittently mostly with exertion    Dysrhythmia    Essential hypertension    Prev followed by H  Lavonia Eager/ Cardiology    Fibroid    age 38   Gallstones    Generalized abdominal cramping    History of cardiomyopathy    Hypertension    IBS (irritable bowel syndrome)    ICD (implantable cardioverter-defibrillator), single, 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 CPAP intolerance >> smothering Good tolerance of auto device (optimal pressure 12-13 on download).  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   Aspirin  Other (See Comments)  Wheezing, (Pt states that she just wheezes some when she takes aspirin  by itself but she can take aspirin  in a combination product).   Contrast Media [Iodinated Contrast Media] Other (See Comments)    Multiple CT contrast studies done over 2 weeks caused ARF   Ciprofloxacin Itching and Rash   Cyclobenzaprine  Other (See Comments)    Can not tolerate   Farxiga  [Dapagliflozin] Rash   Sulfa Antibiotics Itching and Rash     Review of Systems:  No headache, visual changes, nausea, vomiting, diarrhea, constipation, dizziness, abdominal pain, skin rash, fevers,  chills, night sweats, weight loss, swollen lymph nodes, body aches, joint swelling, chest pain, shortness of breath, mood changes. POSITIVE muscle aches  Objective  There were no vitals taken for this visit.   General: No apparent distress alert and oriented x3 mood and affect normal, dressed appropriately.  HEENT: Pupils equal, extraocular movements intact  Respiratory: [name] speak in full sentences and does not appear short of breath  Cardiovascular: No lower extremity edema, non tender, no erythema  Gait MSK:  Back   Osteopathic findings  C2 flexed rotated and side bent right C6 flexed rotated and side bent left T3 extended rotated and side bent right inhaled rib T9 extended rotated and side bent left L2 flexed rotated and side bent right Sacrum right on right       Assessment and Plan:  No problem-specific Assessment & Plan notes found for this encounter.    Nonallopathic problems  Decision today to treat with OMT was based on Physical Exam  After verbal consent patient was treated with HVLA, ME, FPR techniques in cervical, rib, thoracic, lumbar, and sacral  areas  Patient tolerated the procedure well with improvement in symptoms  Patient given exercises, stretches and lifestyle modifications  See medications in patient instructions if given  Patient will follow up in 4-8 weeks      The above documentation has been reviewed and is accurate and complete [name] M Lautaro Koral, DO        Note: This dictation was prepared with Dragon dictation along with smaller phrase technology. Any transcriptional errors that result from this process are unintentional.

## 2023-11-23 ENCOUNTER — Encounter: Payer: Self-pay | Admitting: Internal Medicine

## 2023-11-24 ENCOUNTER — Ambulatory Visit: Admitting: Family Medicine

## 2023-11-24 VITALS — BP 140/82 | HR 72 | Ht 65.0 in | Wt 222.0 lb

## 2023-11-24 DIAGNOSIS — M9908 Segmental and somatic dysfunction of rib cage: Secondary | ICD-10-CM

## 2023-11-24 DIAGNOSIS — M9901 Segmental and somatic dysfunction of cervical region: Secondary | ICD-10-CM | POA: Diagnosis not present

## 2023-11-24 DIAGNOSIS — M9903 Segmental and somatic dysfunction of lumbar region: Secondary | ICD-10-CM

## 2023-11-24 DIAGNOSIS — M9904 Segmental and somatic dysfunction of sacral region: Secondary | ICD-10-CM | POA: Diagnosis not present

## 2023-11-24 DIAGNOSIS — M9902 Segmental and somatic dysfunction of thoracic region: Secondary | ICD-10-CM

## 2023-11-24 DIAGNOSIS — M5010 Cervical disc disorder with radiculopathy, unspecified cervical region: Secondary | ICD-10-CM

## 2023-11-25 ENCOUNTER — Encounter (HOSPITAL_COMMUNITY)

## 2023-11-25 ENCOUNTER — Encounter: Payer: Self-pay | Admitting: Family Medicine

## 2023-11-25 ENCOUNTER — Encounter (HOSPITAL_COMMUNITY): Payer: Self-pay | Admitting: Internal Medicine

## 2023-11-25 DIAGNOSIS — I493 Ventricular premature depolarization: Secondary | ICD-10-CM | POA: Diagnosis not present

## 2023-11-25 DIAGNOSIS — I5022 Chronic systolic (congestive) heart failure: Secondary | ICD-10-CM | POA: Diagnosis not present

## 2023-11-25 NOTE — Assessment & Plan Note (Signed)
 DDD discussed  Discussed HEP  Is an amazing individual  Tightness overall but nothing severe.  Hopefully patient does respond well to the osteopathic manipulation.  Is having PVCs and is considering trying another ablation.  Does have the congestive heart failure which is concerning and makes it difficult treatment for medications.  Patient has done well with weight loss and encouraged her to continue to work on her journey and follow-up with me again in 6 to 8 weeks

## 2023-11-28 ENCOUNTER — Encounter: Payer: Self-pay | Admitting: Family Medicine

## 2023-11-29 ENCOUNTER — Other Ambulatory Visit (HOSPITAL_COMMUNITY): Payer: Self-pay | Admitting: Internal Medicine

## 2023-11-29 ENCOUNTER — Encounter: Payer: Self-pay | Admitting: Internal Medicine

## 2023-11-29 ENCOUNTER — Other Ambulatory Visit: Payer: Self-pay

## 2023-11-29 DIAGNOSIS — R9431 Abnormal electrocardiogram [ECG] [EKG]: Secondary | ICD-10-CM | POA: Diagnosis not present

## 2023-11-29 DIAGNOSIS — I493 Ventricular premature depolarization: Secondary | ICD-10-CM | POA: Diagnosis not present

## 2023-11-29 DIAGNOSIS — I491 Atrial premature depolarization: Secondary | ICD-10-CM | POA: Diagnosis not present

## 2023-11-29 DIAGNOSIS — Z01818 Encounter for other preprocedural examination: Secondary | ICD-10-CM | POA: Diagnosis not present

## 2023-11-29 MED ORDER — DICLOFENAC SODIUM 75 MG PO TBEC
75.0000 mg | DELAYED_RELEASE_TABLET | Freq: Two times a day (BID) | ORAL | Status: DC | PRN
Start: 2023-11-29 — End: 2023-12-01

## 2023-12-01 ENCOUNTER — Other Ambulatory Visit: Payer: Self-pay

## 2023-12-01 MED ORDER — DICLOFENAC SODIUM 75 MG PO TBEC: 75.0000 mg | DELAYED_RELEASE_TABLET | Freq: Two times a day (BID) | ORAL | 0 refills | Status: DC | PRN

## 2023-12-01 NOTE — Telephone Encounter (Signed)
 Rx resubmitted under Normal option.

## 2023-12-01 NOTE — Telephone Encounter (Signed)
 Patient called stating that the pharmacy did not have the prescription. Epic shows Class: No print  Can this be resent?

## 2023-12-02 ENCOUNTER — Encounter (HOSPITAL_COMMUNITY): Payer: Self-pay

## 2023-12-03 DIAGNOSIS — I110 Hypertensive heart disease with heart failure: Secondary | ICD-10-CM | POA: Diagnosis not present

## 2023-12-03 DIAGNOSIS — R008 Other abnormalities of heart beat: Secondary | ICD-10-CM | POA: Diagnosis not present

## 2023-12-03 DIAGNOSIS — Z79899 Other long term (current) drug therapy: Secondary | ICD-10-CM | POA: Diagnosis not present

## 2023-12-03 DIAGNOSIS — I493 Ventricular premature depolarization: Secondary | ICD-10-CM

## 2023-12-03 DIAGNOSIS — K219 Gastro-esophageal reflux disease without esophagitis: Secondary | ICD-10-CM | POA: Diagnosis not present

## 2023-12-03 DIAGNOSIS — I509 Heart failure, unspecified: Secondary | ICD-10-CM | POA: Diagnosis not present

## 2023-12-03 DIAGNOSIS — I472 Ventricular tachycardia, unspecified: Secondary | ICD-10-CM | POA: Diagnosis not present

## 2023-12-03 DIAGNOSIS — R1030 Lower abdominal pain, unspecified: Secondary | ICD-10-CM | POA: Diagnosis not present

## 2023-12-03 DIAGNOSIS — I5022 Chronic systolic (congestive) heart failure: Secondary | ICD-10-CM | POA: Diagnosis not present

## 2023-12-03 DIAGNOSIS — I428 Other cardiomyopathies: Secondary | ICD-10-CM | POA: Diagnosis not present

## 2023-12-03 DIAGNOSIS — G8918 Other acute postprocedural pain: Secondary | ICD-10-CM | POA: Diagnosis not present

## 2023-12-03 DIAGNOSIS — I341 Nonrheumatic mitral (valve) prolapse: Secondary | ICD-10-CM | POA: Diagnosis not present

## 2023-12-03 DIAGNOSIS — I724 Aneurysm of artery of lower extremity: Secondary | ICD-10-CM | POA: Diagnosis not present

## 2023-12-03 DIAGNOSIS — E669 Obesity, unspecified: Secondary | ICD-10-CM | POA: Diagnosis not present

## 2023-12-03 DIAGNOSIS — Z95810 Presence of automatic (implantable) cardiac defibrillator: Secondary | ICD-10-CM | POA: Diagnosis not present

## 2023-12-04 DIAGNOSIS — Z95810 Presence of automatic (implantable) cardiac defibrillator: Secondary | ICD-10-CM | POA: Diagnosis not present

## 2023-12-04 DIAGNOSIS — I428 Other cardiomyopathies: Secondary | ICD-10-CM | POA: Diagnosis not present

## 2023-12-04 DIAGNOSIS — I493 Ventricular premature depolarization: Secondary | ICD-10-CM | POA: Diagnosis not present

## 2023-12-07 ENCOUNTER — Ambulatory Visit: Attending: Student | Admitting: Student

## 2023-12-08 ENCOUNTER — Encounter: Payer: Self-pay | Admitting: Student

## 2023-12-13 ENCOUNTER — Ambulatory Visit (HOSPITAL_COMMUNITY): Attending: Cardiology

## 2023-12-13 ENCOUNTER — Ambulatory Visit: Admitting: Obstetrics and Gynecology

## 2023-12-13 DIAGNOSIS — I5042 Chronic combined systolic (congestive) and diastolic (congestive) heart failure: Secondary | ICD-10-CM | POA: Diagnosis not present

## 2023-12-15 ENCOUNTER — Ambulatory Visit: Payer: Self-pay | Admitting: Internal Medicine

## 2023-12-15 DIAGNOSIS — I509 Heart failure, unspecified: Secondary | ICD-10-CM | POA: Diagnosis not present

## 2023-12-16 ENCOUNTER — Encounter (INDEPENDENT_AMBULATORY_CARE_PROVIDER_SITE_OTHER): Payer: Self-pay | Admitting: Cardiology

## 2023-12-16 DIAGNOSIS — G4733 Obstructive sleep apnea (adult) (pediatric): Secondary | ICD-10-CM

## 2023-12-17 ENCOUNTER — Ambulatory Visit: Admitting: Family Medicine

## 2023-12-17 NOTE — Procedures (Signed)
   SLEEP STUDY REPORT Patient Information Study Date: 12/17/2023 Patient Name: [name] Patient ID: [id number] Birth Date: 1976/02/18 Age: 48 Gender: Female BMI: 36.4 (W=218 lb, H=5' 5'') Referring Physician: Dan Bensimhon, MD  TEST DESCRIPTION: Home sleep apnea testing was completed using the WatchPat, a Type 1 device, utilizing  peripheral arterial tonometry (PAT), chest movement, actigraphy, pulse oximetry, pulse rate, body position and snore.  AHI was calculated with apnea and hypopnea using valid sleep time as the denominator. RDI includes apneas,  hypopneas, and RERAs. The data acquired and the scoring of sleep and all associated events were performed in  accordance with the recommended standards and specifications as outlined in the AASM Manual for the Scoring of  Sleep and Associated Events 2.2.0 (2015).   FINDINGS:   1. Mild Obstructive Sleep Apnea with AHI 9.5/hr.   2. No Central Sleep Apnea with pAHIc 0/hr.   3. Oxygen  desaturations as low as 84%.   4. Moderate to severe snoring was present. O2 sats were < 88% for 10.1 min.   5. Total sleep time was 6 hrs and 39 min.   6. 36.4% of total sleep time was spent in REM sleep.   7. Normal sleep onset latency at 16 min.   8. Shortened REM sleep onset latency at 53 min.   9. Total awakenings were 9.  10. Arrhythmia detection: None   DIAGNOSIS: Mild Obstructive Sleep Apnea (G47.33) Nocturnal Hypoxemia  RECOMMENDATIONS: 1. Clinical correlation of these findings is necessary. The decision to treat obstructive sleep apnea (OSA) is usually  based on the presence of apnea symptoms or the presence of associated medical conditions such as Hypertension,  Congestive Heart Failure, Atrial Fibrillation or Obesity. The most common symptoms of OSA are snoring, gasping for  breath while sleeping, daytime sleepiness and fatigue.  2. Initiating apnea therapy is recommended given the presence of symptoms and/or associated conditions.   Recommend proceeding with one of the following:  a. Auto-CPAP therapy with a pressure range of 5-20cm H2O.  b. An oral appliance (OA) that can be obtained from certain dentists with expertise in sleep medicine. These are  primarily of use in non-obese patients with mild and moderate disease.  c. An ENT consultation which may be useful to look for specific causes of obstruction and possible treatment  options.  d. If patient is intolerant to PAP therapy, consider referral to ENT for evaluation for hypoglossal nerve stimulator.  3. Close follow-up is necessary to ensure success with CPAP or oral appliance therapy for maximum benefit . 4. A follow-up oximetry study on CPAP is recommended to assess the adequacy of therapy and determine the need  for supplemental oxygen  or the potential need for Bi-level therapy. An arterial blood gas to determine the adequacy of  baseline ventilation and oxygenation should also be considered. 5. Healthy sleep recommendations include: adequate nightly sleep (normal 7-9 hrs/night), avoidance of caffeine  after  noon and alcohol near bedtime, and maintaining a sleep environment that is cool, dark and quiet. 6. Weight loss for overweight patients is recommended. Even modest amounts of weight loss can significantly  improve the severity of sleep apnea. 7. Snoring recommendations include: weight loss where appropriate, side sleeping, and avoidance of alcohol before  bed. 8. Operation of motor vehicle should be avoided when sleepy.  Signature: Wilbert Bihari, MD; [facility]; Diplomat, American Board of Sleep  Medicine Electronically Signed: 12/17/2023 4:29:23 PM

## 2023-12-20 ENCOUNTER — Telehealth (HOSPITAL_COMMUNITY): Payer: Self-pay | Admitting: *Deleted

## 2023-12-20 ENCOUNTER — Encounter: Payer: Self-pay | Admitting: Pharmacist Clinician (PhC)/ Clinical Pharmacy Specialist

## 2023-12-20 ENCOUNTER — Telehealth: Payer: Self-pay | Admitting: Pharmacist Clinician (PhC)/ Clinical Pharmacy Specialist

## 2023-12-20 NOTE — Telephone Encounter (Signed)
 Patient recently diagnosed with sleep apnea.  Please try prior auth for Zepbound .  She will get the results to us  if we need them.

## 2023-12-20 NOTE — Telephone Encounter (Signed)
-----   Message from [name] sent at 12/19/2023 10:02 PM EDT ----- Yes please. Thanks ----- Message ----- From: Bernett Saturnino NOVAK, RN Sent: 12/15/2023  12:21 PM EDT To: Toribio JONELLE Fuel, MD  Based upon results, ok to move forward with scheduling cardiac rehab?  Thanks Chaia Ikard

## 2023-12-21 ENCOUNTER — Other Ambulatory Visit (HOSPITAL_COMMUNITY): Payer: Self-pay

## 2023-12-21 ENCOUNTER — Telehealth: Payer: Self-pay

## 2023-12-21 NOTE — Telephone Encounter (Signed)
 Pharmacy Patient Advocate Encounter   Received notification from Physician's Office that prior authorization for ZEPBOUND  is required/requested.   Insurance verification completed.   The patient is insured through [facility] ADVANTAGE/RX ADVANCE .   Per test claim: PLAN EXCLUSION, NOT ON FORMULARY

## 2023-12-22 ENCOUNTER — Ambulatory Visit: Payer: PPO

## 2023-12-22 DIAGNOSIS — I428 Other cardiomyopathies: Secondary | ICD-10-CM

## 2023-12-23 ENCOUNTER — Encounter (HOSPITAL_COMMUNITY): Payer: Self-pay

## 2023-12-23 ENCOUNTER — Telehealth: Payer: Self-pay

## 2023-12-23 ENCOUNTER — Telehealth (HOSPITAL_COMMUNITY): Payer: Self-pay

## 2023-12-23 DIAGNOSIS — G4733 Obstructive sleep apnea (adult) (pediatric): Secondary | ICD-10-CM

## 2023-12-23 NOTE — Telephone Encounter (Signed)
Attempted to call patient in regards to Cardiac Rehab - LM on VM   Sent letter 

## 2023-12-23 NOTE — Telephone Encounter (Signed)
 Left VM with callback number for patient to receive sleep study results and recommendations.

## 2023-12-23 NOTE — Telephone Encounter (Signed)
-----   Message from Wilbert Bihari sent at 12/17/2023  4:30 PM EDT ----- Please let patient know that they have sleep apnea and recommend treating with CPAP.  Please order an auto CPAP from 4-15cm H2O with heated humidity and mask of choice.  Order overnight pulse ox on CPAP.  Followup with me in 6 weeks.

## 2023-12-23 NOTE — Telephone Encounter (Signed)
 Notified patient of sleep study results and recommendations. All questions were answered and patient verbalized understanding. Order for new device and supplies sent to [facility] today.

## 2023-12-24 LAB — CUP PACEART REMOTE DEVICE CHECK
Battery Remaining Longevity: 90 mo
Battery Remaining Percentage: 65 %
Brady Statistic RV Percent Paced: 0 %
Date Time Interrogation Session: 20250730051700
HighPow Impedance: 101 Ohm
Implantable Lead Connection Status: 753985
Implantable Lead Implant Date: 20180312
Implantable Lead Location: 753860
Implantable Lead Model: 293
Implantable Lead Serial Number: 422842
Implantable Pulse Generator Implant Date: 20180312
Lead Channel Impedance Value: 652 Ohm
Lead Channel Setting Pacing Amplitude: 3.5 V
Lead Channel Setting Pacing Pulse Width: 1 ms
Lead Channel Setting Sensing Sensitivity: 0.5 mV
Pulse Gen Serial Number: 226601
Zone Setting Status: 755011

## 2023-12-25 ENCOUNTER — Ambulatory Visit: Payer: Self-pay | Admitting: Internal Medicine

## 2023-12-27 ENCOUNTER — Ambulatory Visit: Attending: Cardiology

## 2023-12-27 ENCOUNTER — Other Ambulatory Visit (HOSPITAL_COMMUNITY): Payer: Self-pay

## 2023-12-27 ENCOUNTER — Telehealth: Payer: Self-pay

## 2023-12-27 ENCOUNTER — Encounter (INDEPENDENT_AMBULATORY_CARE_PROVIDER_SITE_OTHER): Payer: Self-pay

## 2023-12-27 DIAGNOSIS — Z95810 Presence of automatic (implantable) cardiac defibrillator: Secondary | ICD-10-CM

## 2023-12-27 DIAGNOSIS — I5042 Chronic combined systolic (congestive) and diastolic (congestive) heart failure: Secondary | ICD-10-CM

## 2023-12-27 NOTE — Telephone Encounter (Signed)
 Pharmacy Patient Advocate Encounter   Received notification from CoverMyMeds that prior authorization for Zepbound  2.5 is required/requested.   Insurance verification completed.   The patient is insured through [facility] ADVANTAGE/RX ADVANCE .   Per test claim: per chart notes patient was denied 12/20/23.

## 2023-12-27 NOTE — Progress Notes (Signed)
 EPIC Encounter for ICM Monitoring  Patient Name: [name] is a 48 y.o. female Date: 12/27/2023 Primary Care Physican: Rolan Ezra RAMAN, MD Primary Cardiologist: Bensimhon Electrophysiologist: Inocencio 12/28/2022 Weight: 218 lbs       01/27/2023 Weight: 200 lbs     02/03/2023 Weight: 200 lbs 04/21/2023 Weight: 207 lbs (baseline 200-203) 06/01/2023 Office Weight: 214 lbs 07/21/2023 Weight: 206 lbs (baseline 200-203) 07/28/2023 Weight: 209 lbs 08/05/2023 Office Weight: 215 lbs 08/27/2023 Office Weight: 212 lbs 09/27/2023 Weight: 217 lbs 10/08/2023 Weight: unable to reach   Attempted call to patient and unable to reach.  Left detailed message per DPR regarding transmission.  Transmission results reviewed.    Heartlogic HF Index is 0 suggesting normal fluid levels within the last month.     Prescribed: Torsemide  20 mg Take 4 tablets (40 mg total) by mouth twice a day Potassium 20 mEq take 3 tablet(s) (60 mEq total) by mouth twice a day Metolazone  2.5 mg take 1 tablet (2.5 mg total) by mouth as directed.  Per Harlene Gainer, NP 3/13 HF clinic OV discussed limiting metolazone /extra 40 KCL use to 1-2x/week  Spironolactone  25 mg take 1 tablet (25 mg total) at bedtime.    Labs: 11/29/2023 Creatinine 0.70, BUN 8, Potassium 3.9, Sodium 145, GFR >90 11/15/2023 Creatinine 0.83, BUN 8, Potassium 4.3, Sodium 141, GFR >60 A complete set of results can be found in Results Review.   Recommendations:  No changes    Follow-up plan: ICM clinic phone appointment on 01/31/2024.  91 day device clinic remote transmission 03/22/2024.              EP/Cardiology next office visit:  12/30/2023 with Dr Waddell.   02/17/2024 with Jodie Passey, PA.   03/16/2024 with Dr Cherrie.            Copy of ICM check sent to Dr. Inocencio.   3 Month HeartLogicT Heart Failure Index:    8 Day Data Trend:          Megan RAMAN Garner, RN 12/27/2023 8:04 AM

## 2023-12-28 ENCOUNTER — Other Ambulatory Visit: Payer: Self-pay | Admitting: Family Medicine

## 2023-12-30 ENCOUNTER — Encounter: Payer: Self-pay | Admitting: Internal Medicine

## 2023-12-30 ENCOUNTER — Ambulatory Visit: Attending: Internal Medicine | Admitting: Internal Medicine

## 2023-12-30 VITALS — BP 128/74 | HR 79 | Ht 65.0 in | Wt 220.0 lb

## 2023-12-30 DIAGNOSIS — I5042 Chronic combined systolic (congestive) and diastolic (congestive) heart failure: Secondary | ICD-10-CM

## 2023-12-30 DIAGNOSIS — Z95810 Presence of automatic (implantable) cardiac defibrillator: Secondary | ICD-10-CM | POA: Diagnosis not present

## 2023-12-30 NOTE — Progress Notes (Signed)
 HPI [name] returns for evaluation of PVC's. She is a pleasant 48 yo woman with a h/o peripartum CM. She also has had variable amounts of PVC's over the years. She had 5% PVC burden in 2016 in the setting of an EF of 25% and underwent ICD insertion. She never has had an ICD therapy. She had worsening symptoms and a repeat monitor in 2018 showed a 30% PVC burden and she underwent PVC ablation by me where we mapped her PVC's to the septum between the RV and LVOT. We had a 90% pace map match and RF applied to the RVOT septum would suppress the PVC's but not eliminate them. Her PVC morphology is inferior axis with transition from neg to positive at V3 with the PVC in V3 much more positive than the sinus beat in V3, suggestive of an LVOT location. She had a repeat Zio about 18 months ago showing a 13% PVC burden. She has class 2B symptoms and is on maximal medical therapy. A repeat monitor a couple of months ago demonstrated 30% PVC's and she was referred to Dr. Genna for repeat ablation. With RF delivered between the RCC/LCC commisure and the distal coronary sinus and the PVC's appeared to be successfully ablated. She has improved. She still has symptomatic PVC's but her symptoms are clearly better.  Allergies  Allergen Reactions   Vancomycin  Other (See Comments)    did something to my kidneys, PROGRESSED TO KIDNEY FAILURE!!   Aspirin  Other (See Comments)    Wheezing, (Pt states that she just wheezes some when she takes aspirin  by itself but she can take aspirin  in a combination product).   Contrast Media [Iodinated Contrast Media] Other (See Comments)    Multiple CT contrast studies done over 2 weeks caused ARF   Ciprofloxacin Itching and Rash   Cyclobenzaprine  Other (See Comments)    Can not tolerate   Farxiga  [Dapagliflozin] Rash   Sulfa Antibiotics Itching and Rash     Current Outpatient Medications  Medication Sig Dispense Refill   acetaminophen  (TYLENOL ) 650 MG CR tablet Take  650 mg by mouth every 8 (eight) hours as needed for pain (headache).     albuterol  (VENTOLIN  HFA) 108 (90 Base) MCG/ACT inhaler Inhale 1-2 puffs into the lungs every 6 (six) hours as needed for wheezing or shortness of breath. 18 g 0   amiodarone  (PACERONE ) 200 MG tablet Take 1 tablet (200 mg total) by mouth daily. 90 tablet 3   budesonide -formoterol  (SYMBICORT ) 160-4.5 MCG/ACT inhaler Inhale 2 puffs into the lungs 2 (two) times daily as needed. 10 g 3   diclofenac  (VOLTAREN ) 75 MG EC tablet Take 1 tablet by mouth twice daily as needed 60 tablet 0   dicyclomine  (BENTYL ) 20 MG tablet Take 1 tablet (20 mg total) by mouth daily as needed for spasms. 90 tablet 3   digoxin  (LANOXIN ) 0.125 MG tablet Take 1 tablet (0.125 mg total) by mouth daily. 30 tablet 6   ENTRESTO  97-103 MG Take 1 tablet by mouth twice daily 180 tablet 0   fluticasone  (FLONASE ) 50 MCG/ACT nasal spray Place 2 sprays into both nostrils daily as needed for allergies. 16 g 3   gabapentin  (NEURONTIN ) 300 MG capsule Take 1 capsule (300 mg total) by mouth at bedtime as needed (back spasms). 90 capsule 3   ipratropium (ATROVENT ) 0.02 % nebulizer solution Inhale 500 mcg into the lungs.     ipratropium-albuterol  (DUONEB) 0.5-2.5 (3) MG/3ML SOLN USE 1 AMPULE IN NEBULIZER  EVERY 6 HOURS AS NEEDED 360 mL 0   ivabradine  (CORLANOR ) 7.5 MG TABS tablet Take 1 tablet (7.5 mg total) by mouth 2 (two) times daily with a meal. 60 tablet 11   JARDIANCE  25 MG TABS tablet TAKE 1 TABLET BY MOUTH ONCE DAILY BEFORE BREAKFAST 90 tablet 0   Magnesium  Oxide 200 MG TABS Take 1 tablet (200 mg total) by mouth daily. 90 tablet 3   metolazone  (ZAROXOLYN ) 2.5 MG tablet TAKE 1 TABLET BY MOUTH AS DIRECTED 5 tablet 0   metoprolol  succinate (TOPROL -XL) 100 MG 24 hr tablet Take 1 tablet (100 mg total) by mouth daily. Take with or immediately following a meal. 90 tablet 3   mexiletine (MEXITIL ) 150 MG capsule Take 2 capsules (300 mg total) by mouth 2 (two) times daily. 120  capsule 11   Multiple Vitamin (MULTI-VITAMIN) tablet Take 1 tablet by mouth daily.     Multiple Vitamins-Minerals (MULTIVITAMIN GUMMIES WOMENS PO) Take 2 tablets by mouth daily.     nitroGLYCERIN  (NITROSTAT ) 0.4 MG SL tablet Place 1 tablet (0.4 mg total) under the tongue every 5 (five) minutes as needed for chest pain. 75 tablet 0   potassium chloride  SA (KLOR-CON  M) 20 MEQ tablet Take 3 tablets (60 mEq total) by mouth 2 (two) times daily. 180 tablet 6   spironolactone  (ALDACTONE ) 25 MG tablet Take 1 tablet (25 mg total) by mouth at bedtime. 90 tablet 3   tizanidine  (ZANAFLEX ) 2 MG capsule Take 2 mg by mouth as needed for muscle spasms.     torsemide  (DEMADEX ) 20 MG tablet Take 40 mg by mouth 2 (two) times daily.     Ubrogepant  (UBRELVY ) 100 MG TABS Take 1 tablet (100 mg total) by mouth daily as needed (migraine). 30 tablet 3   Vitamin D , Ergocalciferol , (DRISDOL ) 1.25 MG (50000 UNIT) CAPS capsule Take 1 capsule by mouth once a week 12 capsule 0   UNABLE TO FIND Take 1,250 mcg by mouth.     No current facility-administered medications for this visit.     Past Medical History:  Diagnosis Date   Acute on chronic systolic (congestive) heart failure (HCC) 04/29/2017   Acute pain of right shoulder 06/16/2017   AICD (automatic cardioverter/defibrillator) present    Anginal pain (HCC)    Anxiety    Arthritis    right shoulder    Asthma    CHF (congestive heart failure) (HCC)    Chronic combined systolic and diastolic heart failure (HCC) 03/05/2014   Closed low lateral malleolus fracture 10/23/2013   Cystitis 10/21/2017   Depression    Depression with anxiety 01/20/2013   Diabetes mellitus without complication (HCC)    Diverticulosis    Dyspnea    comes and goes intermittently mostly with exertion    Dysrhythmia    Essential hypertension    Prev followed by [name]    Fibroid    age 68   Gallstones    Generalized abdominal cramping    History of cardiomyopathy     Hypertension    IBS (irritable bowel syndrome)    ICD (implantable cardioverter-defibrillator), single, 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 CPAP intolerance >> smothering Good tolerance of auto device (optimal pressure 12-13 on download).  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   Past Surgical History:  Procedure Laterality Date   BREAST BIOPSY Right 09/30/2022   US  RT BREAST BX W LOC DEV 1ST LESION IMG BX SPEC US  GUIDE 09/30/2022 [facility]MO[facility]HY   CARDIAC CATHETERIZATION N/A 11/11/2015   Procedure: Right/Left Heart Cath and Coronary Angiography;  Surgeon: [name], MD;  Location: [facility]VASIVE CV [facility];  Service: Cardiovascular;  Laterality: N/A;   CARDIAC CATHETERIZATION  ~ 2015   CARDIAC DEFIBRILLATOR PLACEMENT  08/03/2016   CESAREAN SECTION  1999   COLONOSCOPY WITH PROPOFOL  N/A 04/21/2016   Procedure: COLONOSCOPY WITH PROPOFOL ;  Surgeon: [name], MD;  Location: [facility];  Service: Gastroenterology;  Laterality: N/A;   ESOPHAGOGASTRODUODENOSCOPY (EGD) WITH PROPOFOL  N/A 04/21/2016   Procedure: ESOPHAGOGASTRODUODENOSCOPY (EGD) WITH  PROPOFOL ;  Surgeon: [name], MD;  Location: [facility];  Service: Gastroenterology;  Laterality: N/A;   FOOT FRACTURE SURGERY Right ~ 2003   FRACTURE SURGERY     ICD IMPLANT N/A 08/03/2016   Procedure: ICD Implant;  Surgeon: [name], MD;  Location: [facility] INVASIVE CV LAB;  Service: Cardiovascular;  Laterality: N/A;   IR ANGIOGRAM PELVIS SELECTIVE OR SUPRASELECTIVE  10/28/2022   IR ANGIOGRAM SELECTIVE EACH ADDITIONAL VESSEL  10/28/2022   IR ANGIOGRAM SELECTIVE EACH ADDITIONAL VESSEL  10/28/2022   IR ANGIOGRAM SELECTIVE EACH ADDITIONAL VESSEL  10/28/2022   IR EMBO TUMOR ORGAN ISCHEMIA INFARCT INC GUIDE ROADMAPPING  10/28/2022   IR US  GUIDE VASC ACCESS LEFT  10/28/2022   IR US  GUIDE VASC ACCESS RIGHT  10/28/2022   LAPAROSCOPIC CHOLECYSTECTOMY  12/2006   LAPAROSCOPIC GASTRIC SLEEVE RESECTION     LAPAROSCOPY ABDOMEN DIAGNOSTIC  2008   cut bile duct w/gallbladder OR; had to go in later & fix leak; hospitalized for 2 months   LEFT HEART CATHETERIZATION WITH CORONARY ANGIOGRAM N/A 02/26/2014   Procedure: LEFT HEART CATHETERIZATION WITH CORONARY ANGIOGRAM;  Surgeon: Candyce GORMAN Reek, MD;  Location: [facility] CATH LAB;  Service: Cardiovascular;  Laterality: N/A;   OVARIAN CYST REMOVAL Right 1999   PVC ABLATION N/A 12/01/2017   Procedure: PVC ABLATION;  Surgeon: Waddell Danelle ORN, MD;  Location: [facility];  Service: Cardiovascular;  Laterality: N/A;   RIGHT HEART CATH N/A 08/05/2017   Procedure: RIGHT HEART CATH;  Surgeon: [name], MD;  Location: [facility];  Service: Cardiovascular;  Laterality: N/A;   RIGHT HEART CATH N/A 11/16/2019   Procedure: RIGHT HEART CATH;  Surgeon: [name], MD;  Location: [facility];  Service: Cardiovascular;  Laterality: N/A;   TUBAL LIGATION  1999     Family History  Problem Relation Age of Onset   Emphysema Maternal Grandmother        smoked   Heart disease Maternal Grandmother 25  MI   Rheum arthritis Mother    Allergies  Daughter    Colon cancer Neg Hx      Social History   Socioeconomic History   Marital status: Married    Spouse name: Beryl   Number of children: 1   Years of education: Not on file   Highest education level: Associate degree: academic program  Occupational History   Occupation: stay at home mom  Tobacco Use   Smoking status: Never    Passive exposure: Never   Smokeless tobacco: Never  Vaping Use   Vaping status: Never Used  Substance and Sexual Activity   Alcohol use: No   Drug use: No   Sexual activity: Yes    Birth control/protection: Surgical, Pill    Comment: BTL  Other Topics Concern   Not on file  Social History Narrative   Lives with husband, daughter niece and great nephew   Social Drivers of Corporate investment banker Strain: Low Risk  (06/08/2023)   Overall Financial Resource Strain (CARDIA)    Difficulty of Paying Living Expenses: Not very hard  Food Insecurity: No Food Insecurity (06/08/2023)   Hunger Vital Sign    Worried About Running Out of Food in the Last Year: Never true    Ran Out of Food in the Last Year: Never true  Transportation Needs: Unmet Transportation Needs (06/08/2023)   PRAPARE - Administrator, Civil Service (Medical): Yes    Lack of Transportation (Non-Medical): No  Physical Activity: Insufficiently Active (06/08/2023)   Exercise Vital Sign    Days of Exercise per Week: 1 day    Minutes of Exercise per Session: 10 min  Stress: Stress Concern Present (06/08/2023)   [employer] of Occupational Health - Occupational Stress Questionnaire    Feeling of Stress : Very much  Social Connections: Socially Isolated (06/08/2023)   Social Connection and Isolation Panel    Frequency of Communication with Friends and Family: Never    Frequency of Social Gatherings with Friends and Family: Never    Attends Religious Services: Never    Database administrator or Organizations: No    Attends Banker Meetings: Never     Marital Status: Married  Catering manager Violence: Not At Risk (05/06/2023)   Humiliation, Afraid, Rape, and Kick questionnaire    Fear of Current or Ex-Partner: No    Emotionally Abused: No    Physically Abused: No    Sexually Abused: No     BP 128/74   Pulse 79   Ht 5' 5 (1.651 m)   Wt 220 lb (99.8 kg)   SpO2 99%   BMI 36.61 kg/m   Physical Exam:  Well appearing NAD HEENT: Unremarkable Neck:  No JVD, no thyromegally Lymphatics:  No adenopathy Back:  No CVA tenderness Lungs:  Clear with no wheezes HEART:  IRegular rate rhythm, no murmurs, no rubs, no clicks Abd:  soft, positive bowel sounds, no organomegally, no rebound, no guarding Ext:  2 plus pulses, no edema, no cyanosis, no clubbing Skin:  No rashes no nodules Neuro:  CN II through XII intact, motor grossly intact  EKG - NSR with 3 PVC's.    Assess/Plan: Symptomatic PVC's - she is s/p a second ablation and appears to be better. I have recommended watchful waiting, continued AA drug therapy and repeat her Zio monitor in a couple of weeks.  Chronic systolic heart failure - continue GDMT.

## 2023-12-30 NOTE — Patient Instructions (Signed)
 Medication Instructions:  Your physician recommends that you continue on your current medications as directed. Please refer to the Current Medication list given to you today.  *If you need a refill on your cardiac medications before your next appointment, please call your pharmacy*  Lab Work: None ordered.  You may go to any [facility] Location for your lab work:  [facility] - [address] ([facility]) - [address] [phone] [address]  [place] - [address]   [place]  - [address]   [place] - [address] - [address] [employer] [place]  - [address] [place] - [address] ([facility]   If you have labs (blood work) drawn today and your tests are completely normal, you will receive your results only by: [employer] (if you have MyChart)  If you have any lab test that is abnormal or we need to change your treatment, we will call you or send a MyChart message to review the results.  Testing/Procedures: Please call if PCP does not order zio monitor   Follow-Up: At [facility], you and your health needs are our priority.  As part of our continuing mission to provide you with exceptional heart care, we have created designated Provider Care Teams.  These Care Teams include your primary Cardiologist (physician) and Advanced Practice Providers (APPs -  Physician Assistants and Nurse Practitioners) who all work together to provide you with the care you need, when you need it.  We recommend signing up for the patient portal called MyChart.  Sign up information is provided on this After Visit Summary.  MyChart is used to connect with patients for Virtual Visits (Telemedicine).  Patients are able to view lab/test results, encounter notes, upcoming appointments, etc.  Non-urgent messages can be sent to your provider as well.   To learn more about what you can do  with MyChart, go to [email or web address].    Your next appointment:   To be determined  The format for your next appointment:   In Person  Provider:   Donnice Primus, MD or one of the following Advanced Practice Providers on your designated Care Team:   Charlies Arthur, [place] Ozell Jodie Passey, [place] Leotis Barrack, NP  Note: Remote monitoring is used to monitor your Pacemaker/ ICD from home. This monitoring reduces the number of office visits required to check your device to one time per year. It allows us  to keep an eye on the functioning of your device to ensure it is working properly.

## 2024-01-03 ENCOUNTER — Encounter (HOSPITAL_COMMUNITY): Payer: Self-pay | Admitting: Internal Medicine

## 2024-01-03 ENCOUNTER — Encounter (INDEPENDENT_AMBULATORY_CARE_PROVIDER_SITE_OTHER): Payer: Self-pay | Admitting: Internal Medicine

## 2024-01-03 ENCOUNTER — Ambulatory Visit (INDEPENDENT_AMBULATORY_CARE_PROVIDER_SITE_OTHER): Admitting: Internal Medicine

## 2024-01-03 ENCOUNTER — Telehealth (HOSPITAL_COMMUNITY): Payer: Self-pay | Admitting: *Deleted

## 2024-01-03 ENCOUNTER — Encounter (HOSPITAL_COMMUNITY)
Admission: RE | Admit: 2024-01-03 | Discharge: 2024-01-03 | Disposition: A | Discharge status: Home / Self Care | Source: Ambulatory Visit | Attending: Internal Medicine | Admitting: Internal Medicine

## 2024-01-03 VITALS — BP 108/77 | HR 100 | Temp 98.4°F | Ht 65.0 in | Wt 211.0 lb

## 2024-01-03 VITALS — BP 132/88 | HR 86 | Ht 65.0 in | Wt 217.6 lb

## 2024-01-03 DIAGNOSIS — Z9884 Bariatric surgery status: Secondary | ICD-10-CM

## 2024-01-03 DIAGNOSIS — I110 Hypertensive heart disease with heart failure: Secondary | ICD-10-CM

## 2024-01-03 DIAGNOSIS — I10 Essential (primary) hypertension: Secondary | ICD-10-CM

## 2024-01-03 DIAGNOSIS — R5383 Other fatigue: Secondary | ICD-10-CM

## 2024-01-03 DIAGNOSIS — E538 Deficiency of other specified B group vitamins: Secondary | ICD-10-CM | POA: Diagnosis not present

## 2024-01-03 DIAGNOSIS — G4733 Obstructive sleep apnea (adult) (pediatric): Secondary | ICD-10-CM

## 2024-01-03 DIAGNOSIS — Z6835 Body mass index (BMI) 35.0-35.9, adult: Secondary | ICD-10-CM | POA: Diagnosis not present

## 2024-01-03 DIAGNOSIS — I5042 Chronic combined systolic (congestive) and diastolic (congestive) heart failure: Secondary | ICD-10-CM | POA: Insufficient documentation

## 2024-01-03 DIAGNOSIS — Z1331 Encounter for screening for depression: Secondary | ICD-10-CM | POA: Diagnosis not present

## 2024-01-03 DIAGNOSIS — E559 Vitamin D deficiency, unspecified: Secondary | ICD-10-CM | POA: Diagnosis not present

## 2024-01-03 DIAGNOSIS — E66812 Obesity, class 2: Secondary | ICD-10-CM | POA: Insufficient documentation

## 2024-01-03 DIAGNOSIS — R0602 Shortness of breath: Secondary | ICD-10-CM | POA: Diagnosis not present

## 2024-01-03 NOTE — Telephone Encounter (Signed)
 Left message for pt regarding clarification of her response on the PHQ9 that differed from a response she had given during a 7am appt on the same day.  Direct contact information provided. Saturnino Bernett PEAK, BSN Cardiac and Emergency planning/management officer

## 2024-01-03 NOTE — Assessment & Plan Note (Signed)
 Combined systolic and diastolic heart failure with reduced ejection fraction Combined systolic and diastolic heart failure with reduced ejection fraction (20-25%). Recent cardiac ablation on December 03, 2023, for PVCs, resulting in reduced PVCs. Current medications include metoprolol , spironolactone , Entresto , loop diuretic and Jardiance . Fluid status is well-managed post-ablation. - Continue current heart failure medications. - Monitor for signs of fluid retention and adjust diuretics as needed. - Encourage consistent sodium intake to prevent fluid retention. - Discuss exercise restrictions with cardiologist.

## 2024-01-03 NOTE — Assessment & Plan Note (Signed)
 Blood pressure .FLOWAMB[5  is goal for age and risk category.  On metoprolol , spironolactone , loop diuretic, Jardiance  and Entresto  without adverse effects.  Most recent renal parameters reviewed which showed stable electrolytes and kidney function.  Continue with weight loss therapy. Losing 10% may improve blood pressure control. Monitor for symptoms of orthostasis while losing weight. Continue current regimen and home monitoring for a goal blood pressure of < 120/80.   Patient advised to reduce sodium in her diet we also have to monitor diuretics closely as she will be at risk for orthostasis as she starts to lose weight.

## 2024-01-03 NOTE — Progress Notes (Signed)
 [address] [place],  [place], [place] [address]  Office: [phone]  /  Fax: [phone]   Subjective   Initial Visit  [name] (MR# [id number]) is a 48 y.o. female who presents for evaluation and treatment of obesity and related comorbidities. Current BMI is Body mass index is 35.11 kg/m. [name] has been struggling with her weight for many years and has been unsuccessful in either losing weight, maintaining weight loss, or reaching her healthy weight goal.  [name] is currently in the action stage of change and ready to dedicate time achieving and maintaining a healthier weight. [name] is interested in becoming our patient and working on intensive lifestyle modifications including (but not limited to) diet and exercise for weight loss.  Weight history:  She was referred by: Specialist   When asked what else they would like to accomplish? She states: Adopt healthier eating patterns, Improve energy levels and physical activity, Improve existing medical conditions, Reduce number of medications, Improve quality of life, and Avoid another implantable cardiac device   When asked how has your weight affected you? She states: Contributed to medical problems, Contributed to orthopedic problems or mobility issues, Having fatigue, Having poor endurance, Problems with eating patterns, and Has affected mood    Weight history: Weight gain since Cardiomypoathy s/p pregnancy in 1999   Highest weight: 304 lbs   Some associated conditions: Hypertension, Hyperlipidemia, and Other: Post partum Cardiomyopathy, ? T2DM many years ago at a heavier weight   Contributing factors: disruption of circadian rhythm / sleep disordered breathing, consumption of processed foods, use of obesogenic medications: Beta-blockers and Other: Gabapentin , moderate to high levels of stress, and reduced physical acitivity   Weight promoting medications identified: Beta-blockers and Other: Gabapentin    Prior weight loss  attempts: Balanced Plate / Portion Control   Current nutrition plan: None   Current level of physical activity: Walking 30 minutes, twice a week   Current or previous pharmacotherapy: Saxenda , used for a year, lost weight and gained weight back    Nutritional History:  Discussed the use of AI scribe software for clinical note transcription with the patient, who gave verbal consent to proceed.  History of Present Illness   [name] is a 48 year old female with high blood pressure, postpartum cardiomyopathy, and sleep apnea who presents for medical weight management intake.  She has a history of high blood pressure, postpartum cardiomyopathy, and sleep apnea. Despite a mild sleep apnea diagnosis from a sleep study conducted in early July 2025, she has not yet received her CPAP machine. She has lost five pounds since her last visit in May 2025.  She has a history of diabetes diagnosed around 2010, but her recent A1c levels have been normal, and she is not currently on insulin . She was on Saxenda  for about a year in the past, which helped her lose weight, but she regained it after stopping due to insurance issues.  She underwent a cardiac ablation on December 03, 2023, for PVCs, which reduced her symptoms significantly. Her last ejection fraction was 20-25%. She is currently on metoprolol , Jardiance , spironolactone , and takes gabapentin  as needed. She has been in cardiac rehab twice before and is starting again next week.  She underwent gastric sleeve surgery in 2019, with a pre-surgical weight around 260 lbs, and her lowest post-surgical weight was about 180 lbs. She started regaining weight around mid-2020, coinciding with a broken ankle and the COVID-19 pandemic. Her body fat percentage is currently 43%.  She  has a history of depression, scoring 17 on a recent depression scale. She has been on mirtazapine  in the past, which caused drowsiness and lethargy. She lives with her  supportive husband and daughter, exercises regularly, and uses a fitness tracker. She has been skipping breakfast and lunch for years and tends to eat when stressed.  Her current medications include metoprolol , Jardiance , spironolactone , and gabapentin  as needed. She has tried various diets, including keto and small portions post-surgery. She drinks milk, juice, and tea, and has cut out sodas. Her metabolic rate is 8429 calories at rest.       Current nutrition plan: None.  How many times do you eat outside the home: 1-2 per week  How often do they skip meals: skips 1-2 meals a day  What beverages do they drink: water, caffeinated beverages , juice, and protein.   Use of artificial sweetners : No  Food intolerances or dislikes: Some vegetables.  Food triggers: Stress and None.  Food cravings: Salmon, tacos, burgers, cookies, crab legs, sushi, wings, peanut butter  Do they struggle with excessive hunger or portion control : No    Physical Activity:  Current level of physical activity: Other: Full cardiac rehab 3 times a week 90 minutes and 6000 steps a day  Barriers to Exercise: None   Past medical history includes:   Past Medical History:  Diagnosis Date   Acute on chronic systolic (congestive) heart failure (HCC) 04/29/2017   Acute pain of right shoulder 06/16/2017   ADHD    AICD (automatic cardioverter/defibrillator) present    Anginal pain (HCC)    Anxiety    Arthritis    right shoulder    Asthma    Back pain    CHF (congestive heart failure) (HCC)    Chronic combined systolic and diastolic heart failure (HCC) 03/05/2014   Closed low lateral malleolus fracture 10/23/2013   Constipation    Cystitis 10/21/2017   Depression    Depression with anxiety 01/20/2013   Diabetes mellitus without complication (HCC)    Diverticulosis    Dyspnea    comes and goes intermittently mostly with exertion    Dysrhythmia    Edema, lower extremity    Essential hypertension     Prev followed by [name]    Fibroid    age 30   Gallstones    Generalized abdominal cramping    History of cardiomyopathy    Hypertension    IBS (irritable bowel syndrome)    ICD (implantable cardioverter-defibrillator), single, 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 CPAP intolerance >> smothering Good tolerance of auto device (optimal pressure 12-13 on download).  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   PVC (premature ventricular contraction) 06/23/2016   Seizures (HCC)  as a child (08/03/2016)   SOB (shortness of breath)    Termination of pregnancy    due to cardiac risk   Vitamin B12 deficiency    Vitamin D  deficiency      Objective   BP 108/77   Pulse 100   Temp 98.4 F (36.9 C)   Ht 5' 5 (1.651 m)   Wt 211 lb (95.7 kg)   SpO2 97%   BMI 35.11 kg/m  She was weighed on the bioimpedance scale: Body mass index is 35.11 kg/m.    Anthropometrics:  Vitals Temp: 98.4 F (36.9 C) BP: 108/77 Pulse Rate: 100 SpO2: 97 %   Anthropometric Measurements Height: 5' 5 (1.651 m) Weight: 211 lb (95.7 kg) BMI (Calculated): 35.11 Starting Weight: 211 lb Peak Weight: 328 lb Waist Measurement : 40 inches   Body Composition  Body Fat %: 43.8 % Fat Mass (lbs): 92.8 lbs Muscle Mass (lbs): 113  lbs Total Body Water (lbs): 76.8 lbs Visceral Fat Rating : 11   Other Clinical Data Fasting: yes Labs: yes Today's Visit #: 1 Starting Date: 01/03/24    Physical Exam:  General: She is overweight, cooperative, alert, well developed, and in no acute distress. PSYCH: Has normal mood, affect and thought process.   HEENT: EOMI, sclerae are anicteric. Lungs: Normal breathing effort, no conversational dyspnea. Extremities: No edema.  Neurologic: No gross sensory or motor deficits. No tremors or fasciculations noted.    Diagnostic Data Reviewed   Indirect Calorimeter completed today shows a VO2 of 227 and a REE of 1570.  Her calculated basal metabolic rate is 8337 thus her resting energy expenditure slower than calculated.  Depression Screen  Endiya's PHQ-9 score was: 17.     01/03/2024    8:07 AM  Depression screen PHQ 2/9  Decreased Interest 2  Down, Depressed, Hopeless 2  PHQ - 2 Score 4  Altered sleeping 3  Tired, decreased energy 2  Change in appetite 2  Feeling bad or failure about yourself  2  Trouble concentrating 3  Moving slowly or fidgety/restless 1  Suicidal thoughts 0  PHQ-9 Score 17    Screening for Sleep Related Breathing Disorders  Ercel admits to daytime somnolence and admits to waking up still tired. Patient has a history of symptoms of daytime fatigue, morning fatigue, and morning headache. [name] generally gets 5 or 6 hours of sleep per night, and states that she does not sleep well most nights. Snoring is present. Apneic episodes are present. Epworth Sleepiness Score is 14.   BMET    Component Value Date/Time   NA 141 11/15/2023 1445   NA 138 07/23/2023 1322   K 4.3 11/15/2023 1445   CL 109 11/15/2023 1445   CO2 18 (L) 11/15/2023 1445   GLUCOSE 89 11/15/2023 1445   BUN 8 11/15/2023 1445   BUN 22 07/23/2023 1322   CREATININE 0.83 11/15/2023 1445   CREATININE 0.90 03/26/2016 1536   CALCIUM 8.9 11/15/2023 1445   GFRNONAA >60 11/15/2023 1445    GFRNONAA >89 01/18/2014 1621   GFRAA >60 02/15/2020 1845   GFRAA >89 01/18/2014 1621   Lab Results  Component Value Date   HGBA1C 5.2 07/16/2023   HGBA1C 5.4 11/09/2015   No results found for: INSULIN  CBC    Component Value Date/Time   WBC 5.5 07/16/2023 1218   RBC 4.85 07/16/2023 1218   HGB 13.4 07/16/2023 1218   HGB 10.6 (L) 09/07/2019 1159   HCT 42.1 07/16/2023 1218   HCT  32.6 (L) 08/06/2020 1517   PLT 279 07/16/2023 1218   PLT 278 09/07/2019 1159   MCV 86.8 07/16/2023 1218   MCV 82 09/07/2019 1159   MCH 27.6 07/16/2023 1218   MCHC 31.8 07/16/2023 1218   RDW 15.0 07/16/2023 1218   RDW 14.7 09/07/2019 1159   Iron /TIBC/Ferritin/ %Sat    Component Value Date/Time   IRON  61 07/16/2023 1218   TIBC 472 (H) 07/16/2023 1218   FERRITIN 11 07/16/2023 1218   IRONPCTSAT 13 07/16/2023 1218   Lipid Panel     Component Value Date/Time   CHOL 164 06/11/2023 1107   TRIG 66.0 06/11/2023 1107   HDL 67.00 06/11/2023 1107   CHOLHDL 2 06/11/2023 1107   VLDL 13.2 06/11/2023 1107   LDLCALC 83 06/11/2023 1107   Hepatic Function Panel     Component Value Date/Time   PROT 6.7 01/21/2023 1523   PROT 7.3 05/15/2021 1032   ALBUMIN  3.8 01/21/2023 1523   ALBUMIN  4.5 05/15/2021 1032   AST 38 01/21/2023 1523   ALT 30 01/21/2023 1523   ALKPHOS 64 01/21/2023 1523   BILITOT 1.1 01/21/2023 1523   BILITOT 0.3 05/15/2021 1032   BILIDIR <0.1 08/26/2020 0201   IBILI 0.6 11/21/2017 2155      Component Value Date/Time   TSH 1.750 07/23/2023 1321     Assessment and Plan   TREATMENT PLAN FOR OBESITY:  Recommended Dietary Goals  [name] is currently in the action stage of change. As such, her goal is to implement medically supervised obesity management plan.  She has agreed to implement: the Category 1 plan - 1000 kcal per day  Behavioral Intervention  We discussed the following Behavioral Modification Strategies today: increasing lean protein intake to established goals,  decreasing simple carbohydrates , increasing vegetables, increasing lower glycemic fruits, increasing fiber rich foods, avoiding skipping meals, increasing water intake, work on meal planning and preparation, reading food labels , keeping healthy foods at home, identifying sources and decreasing liquid calories, decreasing eating out or consumption of processed foods, and making healthy choices when eating convenient foods, planning for success, and better snacking choices  Additional resources provided today: Handout on healthy eating and balanced plate, Handout on complex carbohydrates and lean sources of protein, Category 1 packet, and principles of weight management  Recommended Physical Activity Goals  [name] has been advised to work up to 150 minutes of moderate intensity aerobic activity a week and strengthening exercises 2-3 times per week for cardiovascular health, weight loss maintenance and preservation of muscle mass.   She has agreed to :  continue to gradually increase the amount and intensity of exercise routine  Medical Interventions and Pharmacotherapy We will work on building a Therapist, art and behavioral strategies. We will discuss the role of pharmacotherapy as an adjunct at subsequent visits.   ASSOCIATED CONDITIONS ADDRESSED TODAY  Other Fatigue [name] admits to daytime somnolence and admits to waking up still tired. Patient has a history of symptoms of daytime fatigue, morning fatigue, and morning headache. [name] generally gets 5 or 6 hours of sleep per night, and states that she does not sleep well most nights. Snoring is present. Apneic episodes are present. Epworth Sleepiness Score is 14. [name] does feel that her weight is causing her energy to be lower than it should be. Fatigue may be related to obesity, depression or many other causes. Labs will be ordered, and in the meanwhile, [name] will focus on self care including making healthy food  choices,  increasing physical activity and focusing on stress reduction.  Shortness of Breath [name] notes increasing shortness of breath with physical activity and seems to be worsening over time with weight gain. She notes getting out of breath sooner with activity than she used to. This has not gotten worse recently. [name] denies shortness of breath at rest or orthopnea.  Other fatigue  SOB (shortness of breath) on exertion  Depression screen   Assessment & Plan Other fatigue  SOB (shortness of breath) on exertion  Depression screen  Essential hypertension Blood pressure .FLOWAMB[5  is goal for age and risk category.  On metoprolol , spironolactone , loop diuretic, Jardiance  and Entresto  without adverse effects.  Most recent renal parameters reviewed which showed stable electrolytes and kidney function.  Continue with weight loss therapy. Losing 10% may improve blood pressure control. Monitor for symptoms of orthostasis while losing weight. Continue current regimen and home monitoring for a goal blood pressure of < 120/80.   Patient advised to reduce sodium in her diet we also have to monitor diuretics closely as she will be at risk for orthostasis as she starts to lose weight.  Chronic combined systolic and diastolic heart failure (HCC) Combined systolic and diastolic heart failure with reduced ejection fraction Combined systolic and diastolic heart failure with reduced ejection fraction (20-25%). Recent cardiac ablation on December 03, 2023, for PVCs, resulting in reduced PVCs. Current medications include metoprolol , spironolactone , Entresto , loop diuretic and Jardiance . Fluid status is well-managed post-ablation. - Continue current heart failure medications. - Monitor for signs of fluid retention and adjust diuretics as needed. - Encourage consistent sodium intake to prevent fluid retention. - Discuss exercise restrictions with cardiologist. OSA (obstructive sleep apnea) - [facility]  11/2023 - Mild - Will be starting CPAP Diagnosed with mild obstructive sleep apnea in early July 2025. Awaiting CPAP machine. Sleep apnea may contribute to weight gain and metabolic issues. - Follow up on CPAP machine acquisition. Positive screening for depression on 9-item Patient Health Questionnaire (PHQ-9) Scored 17 on depression scale. History of treatment with mirtazapine , which was discontinued due to side effects. Depression may be influenced by cardiac issues and medications like metoprolol . - Schedule an appointment with PCP to focus on depression management. - Consider alternative treatments or therapy for depression. S/P laparoscopic sleeve gastrectomy Surgery 2019, 260 presurgical weight, nadir 180 over 3-4 months, weight regain 2020.  I suspect that she lost a significant amount of muscle due to rapid weight loss which resulted in weight loss plateau earlier than expected. B12 deficiency Check B12 levels currently not on supplementation. Vitamin D  deficiency Most recent vitamin D  levels  Lab Results  Component Value Date   VD25OH 14.88 (L) 06/11/2023   VD25OH 12.60 (L) 07/29/2020     Deficiency state associated with adiposity and may result in leptin resistance, weight gain and fatigue.  Plan: Check levels today  Class 2 severe obesity with serious comorbidity and body mass index (BMI) of 35.0 to 35.9 in adult, unspecified obesity type (HCC) Obesity with weight regain post-gastric sleeve surgery in 2019. Initial weight loss to 182-187 lbs, with subsequent regain starting around 2020. Current body fat percentage is 43%, with a goal to reduce to 34% or less. Contributing factors include sleep apnea, heart medications, menopause, and chronic meal skipping. Metabolic rate slightly slower than expected. - Implement a 1000-calorie meal plan with three meals a day, focusing on whole foods and reducing processed foods. - Increase protein intake to 90 grams per day, with 30 grams per  meal. -  Prioritize protein and vegetables in meals. - Avoid cooking with oils and butters; use sprays instead. - Limit sodium intake to 2000 mg per day. - Encourage physical activity and cardiac rehab participation. - Monitor weight and body composition regularly.        Follow-up  She was informed of the importance of frequent follow-up visits to maximize her success with intensive lifestyle modifications for her multiple health conditions. She was informed we would discuss her lab results at her next visit unless there is a critical issue that needs to be addressed sooner. [name] agreed to keep her next visit at the agreed upon time to discuss these results.  Attestation Statement  This is the patient's intake visit at [facility]. The patient's Health Questionnaire was reviewed at length. Included in the packet: current and past health history, medications, allergies, ROS, gynecologic history (women only), surgical history, family history, social history, weight history, weight loss surgery history (for those that have had weight loss surgery), nutritional evaluation, mood and food questionnaire, PHQ9, Epworth questionnaire, sleep habits questionnaire, patient life and health improvement goals questionnaire. These will all be scanned into the patient's chart under media.   During the visit, I independently reviewed the patient's previous labs, bioimpedance scale results, and indirect calorimetry results. I used this information to medically tailor a meal plan for the patient that will help her to lose weight and will improve her obesity-related conditions. I performed a medically necessary appropriate examination and/or evaluation. I discussed the assessment and treatment plan with the patient. The patient was provided an opportunity to ask questions and all were answered. The patient agreed with the plan and demonstrated an understanding of the instructions. Labs were ordered  at this visit and will be reviewed at the next visit unless critical results need to be addressed immediately. Clinical information was updated and documented in the EMR.   In addition, they received basic education on identification of processed foods and reduction of these, different sources of lean proteins and complex carbohydrates and how to eat balanced by incorporation of whole foods.  Reviewed by clinician on day of visit: allergies, medications, problem list, medical history, surgical history, family history, social history, and previous encounter notes.  I have spent 65 minutes in the care of the patient today including: 7 minutes before the visit reviewing and preparing the chart. 52 minutes face-to-face assessing and reviewing listed medical problems as outlined in obesity care plan, providing nutritional and behavioral counseling on topics outlined in the obesity care plan, counseling regarding anti-obesity medication as outlined in obesity care plan, independently interpreting test results and goals of care, as described in assessment and plan, reviewing and discussing biometric information and progress, and ordering diagnostics - see orders 7 minutes after the visit updating chart and documentation of encounter.       Lucas Parker, MD

## 2024-01-03 NOTE — Assessment & Plan Note (Signed)
 Scored 17 on depression scale. History of treatment with mirtazapine , which was discontinued due to side effects. Depression may be influenced by cardiac issues and medications like metoprolol . - Schedule an appointment with PCP to focus on depression management. - Consider alternative treatments or therapy for depression.

## 2024-01-03 NOTE — Assessment & Plan Note (Signed)
 Surgery 2019, 260 presurgical weight, nadir 180 over 3-4 months, weight regain 2020.  I suspect that she lost a significant amount of muscle due to rapid weight loss which resulted in weight loss plateau earlier than expected.

## 2024-01-03 NOTE — Assessment & Plan Note (Signed)
 Check B12 levels currently not on supplementation.

## 2024-01-03 NOTE — Progress Notes (Deleted)
 f

## 2024-01-03 NOTE — Progress Notes (Signed)
 Cardiac Rehab Medication Review   Does the patient  feel that his/her medications are working for him/her? Yes    Has the patient been experiencing any side effects to the medications prescribed? No  Does the patient measure his/her own blood pressure or blood glucose at home?   Yes  Does the patient have any problems obtaining medications due to transportation or finances?   No  Understanding of regimen: excellent Understanding of indications: excellent Potential of compliance: excellent    Comments: [name] understands her medications and regime well. She checks her BP a few times a week and weighs daily. She did not take meds prior to today's appt, stating she had an appt before this and couldn't take them for that and did not have time to return home to take them before coming here. She understands to take all meds prior to exercise appts from here on.    Con Megan Pereyra, MS, ACSM-CEP 01/03/2024 12:36 PM

## 2024-01-03 NOTE — Assessment & Plan Note (Signed)
 Diagnosed with mild obstructive sleep apnea in early July 2025. Awaiting CPAP machine. Sleep apnea may contribute to weight gain and metabolic issues. - Follow up on CPAP machine acquisition.

## 2024-01-03 NOTE — Assessment & Plan Note (Signed)
 Most recent vitamin D  levels  Lab Results  Component Value Date   VD25OH 14.88 (L) 06/11/2023   VD25OH 12.60 (L) 07/29/2020     Deficiency state associated with adiposity and may result in leptin resistance, weight gain and fatigue.  Plan: Check levels today

## 2024-01-03 NOTE — Progress Notes (Signed)
 Cardiac Individual Treatment Plan  Patient Details  Name: [name] MRN: [id number] Date of Birth: 05-Dec-1975 Referring Provider:   Flowsheet Row INTENSIVE CARDIAC REHAB ORIENT from 01/03/2024 in [facility]  Referring Provider [name], MD    Initial Encounter Date:  Flowsheet Row INTENSIVE CARDIAC REHAB ORIENT from 01/03/2024 in [facility]  Date 01/03/24    Visit Diagnosis: Heart failure, systolic and diastolic, chronic (HCC)  [name] Home Medications on Admission:  Current Outpatient Medications:    acetaminophen  (TYLENOL ) 650 MG CR tablet, Take 650 mg by mouth every 8 (eight) hours as needed for pain (headache)., Disp: , Rfl:    albuterol  (VENTOLIN  HFA) 108 (90 Base) MCG/ACT inhaler, Inhale 1-2 puffs into the lungs every 6 (six) hours as needed for wheezing or shortness of breath., Disp: 18 g, Rfl: 0   budesonide -formoterol  (SYMBICORT ) 160-4.5 MCG/ACT inhaler, Inhale 2 puffs into the lungs 2 (two) times daily as needed., Disp: 10 g, Rfl: 3   diclofenac  (VOLTAREN ) 75 MG EC tablet, Take 1 tablet by mouth twice daily as needed, Disp: 60 tablet, Rfl: 0   dicyclomine  (BENTYL ) 20 MG tablet, Take 1 tablet (20 mg total) by mouth daily as needed for spasms., Disp: 90 tablet, Rfl: 3   ENTRESTO  97-103 MG, Take 1 tablet by mouth twice daily, Disp: 180 tablet, Rfl: 0   fluticasone  (FLONASE ) 50 MCG/ACT nasal spray, Place 2 sprays into both nostrils daily as needed for allergies., Disp: 16 g, Rfl: 3   gabapentin  (NEURONTIN ) 300 MG capsule, Take 1 capsule (300 mg total) by mouth at bedtime as needed (back spasms)., Disp: 90 capsule, Rfl: 3   ipratropium (ATROVENT ) 0.02 % nebulizer solution, Inhale 500 mcg into the lungs., Disp: , Rfl:    ipratropium-albuterol  (DUONEB) 0.5-2.5 (3) MG/3ML SOLN, USE 1 AMPULE IN NEBULIZER EVERY 6 HOURS AS NEEDED, Disp: 360 mL, Rfl: 0    ivabradine  (CORLANOR ) 7.5 MG TABS tablet, Take 1 tablet (7.5 mg total) by mouth 2 (two) times daily with a meal., Disp: 60 tablet, Rfl: 11   JARDIANCE  25 MG TABS tablet, TAKE 1 TABLET BY MOUTH ONCE DAILY BEFORE BREAKFAST, Disp: 90 tablet, Rfl: 0   Magnesium  Oxide 200 MG TABS, Take 1 tablet (200 mg total) by mouth daily., Disp: 90 tablet, Rfl: 3   metolazone  (ZAROXOLYN ) 2.5 MG tablet, TAKE 1 TABLET BY MOUTH AS DIRECTED, Disp: 5 tablet, Rfl: 0   metoprolol  succinate (TOPROL -XL) 100 MG 24 hr tablet, Take 1 tablet (100 mg total) by mouth daily. Take with or immediately following a meal., Disp: 90 tablet, Rfl: 3   Multiple Vitamins-Minerals (MULTIVITAMIN GUMMIES WOMENS PO), Take 2 tablets by mouth daily., Disp: , Rfl:    nitroGLYCERIN  (NITROSTAT ) 0.4 MG SL tablet, Place 1 tablet (0.4 mg total) under the tongue every 5 (five) minutes as needed for chest pain., Disp: 75 tablet, Rfl: 0   potassium chloride  SA (KLOR-CON  M) 20 MEQ tablet, Take 3 tablets (60 mEq total) by mouth 2 (two) times daily., Disp: 180 tablet, Rfl: 6   spironolactone  (ALDACTONE ) 25 MG tablet, Take 1 tablet (25 mg total) by mouth at bedtime., Disp: 90 tablet, Rfl: 3   tizanidine  (ZANAFLEX ) 2 MG capsule, Take 2 mg by mouth as needed for muscle spasms., Disp: , Rfl:    torsemide  (DEMADEX ) 20 MG tablet, Take 40 mg by mouth 2 (two) times daily., Disp: , Rfl:  Ubrogepant  (UBRELVY ) 100 MG TABS, Take 1 tablet (100 mg total) by mouth daily as needed (migraine)., Disp: 30 tablet, Rfl: 3   Vitamin D , Ergocalciferol , (DRISDOL ) 1.25 MG (50000 UNIT) CAPS capsule, Take 1 capsule by mouth once a week, Disp: 12 capsule, Rfl: 0   amiodarone  (PACERONE ) 200 MG tablet, Take 1 tablet (200 mg total) by mouth daily. (Patient not taking: Reported on 01/03/2024), Disp: 90 tablet, Rfl: 3   digoxin  (LANOXIN ) 0.125 MG tablet, Take 1 tablet (0.125 mg total) by mouth daily. (Patient not taking: Reported on 01/03/2024), Disp: 30 tablet, Rfl: 6   mexiletine (MEXITIL ) 150  MG capsule, Take 2 capsules (300 mg total) by mouth 2 (two) times daily. (Patient not taking: Reported on 01/03/2024), Disp: 120 capsule, Rfl: 11   Multiple Vitamin (MULTI-VITAMIN) tablet, Take 1 tablet by mouth daily. (Patient not taking: Reported on 01/03/2024), Disp: , Rfl:   Past Medical History: Past Medical History:  Diagnosis Date   Acute on chronic systolic (congestive) heart failure (HCC) 04/29/2017   Acute pain of right shoulder 06/16/2017   ADHD    AICD (automatic cardioverter/defibrillator) present    Anginal pain (HCC)    Anxiety    Arthritis    right shoulder    Asthma    Back pain    CHF (congestive heart failure) (HCC)    Chronic combined systolic and diastolic heart failure (HCC) 03/05/2014   Closed low lateral malleolus fracture 10/23/2013   Constipation    Cystitis 10/21/2017   Depression    Depression with anxiety 01/20/2013   Diabetes mellitus without complication (HCC)    Diverticulosis    Dyspnea    comes and goes intermittently mostly with exertion    Dysrhythmia    Edema, lower extremity    Essential hypertension    Prev followed by [name]    Fibroid    age 42   Gallstones    Generalized abdominal cramping    History of cardiomyopathy    Hypertension    IBS (irritable bowel syndrome)    ICD (implantable cardioverter-defibrillator), single, 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 CPAP intolerance >> smothering Good tolerance of auto device (optimal  pressure 12-13 on download).  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   PVC (premature ventricular contraction) 06/23/2016   Seizures (HCC)    as a child (08/03/2016)   SOB (shortness of breath)    Termination of pregnancy    due to cardiac risk   Vitamin B12 deficiency    Vitamin D  deficiency     Tobacco Use: Social History   Tobacco Use  Smoking Status Never   Passive exposure: Never  Smokeless Tobacco Never    Labs: Review Flowsheet  More data exists  Latest Ref Rng & Units 08/26/2020 12/13/2021 12/14/2021 06/11/2023 07/16/2023  Labs for ITP Cardiac and Pulmonary Rehab  Cholestrol 0 - 200 mg/dL - - - 835  -  LDL (calc) 0 - 99 mg/dL - - - 83  -  HDL-C >60.99 mg/dL - - - 32.99  -  Trlycerides 0.0 - 149.0 mg/dL - 84  - 33.9  -  Hemoglobin A1c 4.8 - 5.6 % 5.5  - - 5.5  5.2   Bicarbonate 20.0 - 28.0 mmol/L - - 24.7  - -  TCO2 22 - 32 mmol/L - - 26  - -  O2 Saturation % - - 100  - -    Capillary Blood Glucose: Lab Results  Component Value Date   GLUCAP 90 10/28/2022   GLUCAP 89 11/08/2020   GLUCAP 106 (H) 11/08/2020   GLUCAP 87 08/27/2020   GLUCAP 84 08/27/2020     Exercise Target Goals: Exercise Program Goal: Individual exercise prescription set using results from initial 6 min walk test and THRR while considering  patient's activity barriers and safety.   Exercise Prescription Goal: Initial exercise prescription builds to 30-45 minutes a day of aerobic activity, 2-3 days per week.  Home exercise guidelines will be given to patient during program as part of exercise prescription that the participant will acknowledge.  Activity Barriers & Risk Stratification:  Activity Barriers & Cardiac Risk Stratification - 01/03/24 1228       Activity Barriers & Cardiac Risk Stratification   Activity  Barriers Joint Problems;Deconditioning;Shortness of Breath;Decreased Ventricular Function;History of Falls;Balance Concerns    Cardiac Risk Stratification High   <5 METs on         6 Minute Walk:  6 Minute Walk     Row Name 01/03/24 1227         6 Minute Walk   Phase Initial     Distance 1265 feet     Walk Time 6 minutes     # of Rest Breaks 0     MPH 2.4     METS 3.76     RPE 12     Perceived Dyspnea  0     VO2 Peak 13.17     Symptoms No     Resting HR 86 bpm     Resting BP 132/88     Resting Oxygen  Saturation  98 %     Exercise Oxygen  Saturation  during 6 min walk 98 %     Max Ex. HR 118 bpm     Max Ex. BP 144/102  pt did not take meds due to appt prior     2 Minute Post BP 134/92        Oxygen  Initial Assessment:   Oxygen  Re-Evaluation:   Oxygen  Discharge (Final Oxygen  Re-Evaluation):   Initial Exercise Prescription:  Initial Exercise Prescription - 01/03/24 1200       Date of Initial Exercise RX and Referring Provider   Date 01/03/24    Referring Provider [name], MD    Expected Discharge Date 03/29/24      NuStep   Level 1    SPM 70    Minutes 15    METs 2      Track   Laps 14    Minutes 15    METs 2      Prescription Details   Frequency (times per week) 3    Duration Progress to 30 minutes of continuous aerobic without signs/symptoms of physical distress  Intensity   THRR 40-80% of Max Heartrate 68-137    Ratings of Perceived Exertion 11-13    Perceived Dyspnea 0-4      Progression   Progression Continue to progress workloads to maintain intensity without signs/symptoms of physical distress.      Resistance Training   Training Prescription Yes    Weight 2    Reps 10-15          Perform Capillary Blood Glucose checks as needed.  Exercise Prescription Changes:   Exercise Comments:   Exercise Goals and Review:   Exercise Goals     Row Name 01/03/24 1230             Exercise Goals   Increase  Physical Activity Yes       Intervention Develop an individualized exercise prescription for aerobic and resistive training based on initial evaluation findings, risk stratification, comorbidities and participant's personal goals.;Provide advice, education, support and counseling about physical activity/exercise needs.       Expected Outcomes Long Term: Exercising regularly at least 3-5 days a week.;Short Term: Attend rehab on a regular basis to increase amount of physical activity.;Long Term: Add in home exercise to make exercise part of routine and to increase amount of physical activity.       Increase Strength and Stamina Yes       Intervention Provide advice, education, support and counseling about physical activity/exercise needs.;Develop an individualized exercise prescription for aerobic and resistive training based on initial evaluation findings, risk stratification, comorbidities and participant's personal goals.       Expected Outcomes Short Term: Increase workloads from initial exercise prescription for resistance, speed, and METs.;Long Term: Improve cardiorespiratory fitness, muscular endurance and strength as measured by increased METs and functional capacity ( );Short Term: Perform resistance training exercises routinely during rehab and add in resistance training at home       Able to understand and use rate of perceived exertion (RPE) scale Yes       Intervention Provide education and explanation on how to use RPE scale       Expected Outcomes Short Term: Able to use RPE daily in rehab to express subjective intensity level;Long Term:  Able to use RPE to guide intensity level when exercising independently       Knowledge and understanding of Target Heart Rate Range (THRR) Yes       Intervention Provide education and explanation of THRR including how the numbers were predicted and where they are located for reference       Expected Outcomes Long Term: Able to use THRR to govern intensity  when exercising independently;Short Term: Able to state/look up THRR;Short Term: Able to use daily as guideline for intensity in rehab       Understanding of Exercise Prescription Yes       Intervention Provide education, explanation, and written materials on patient's individual exercise prescription       Expected Outcomes Short Term: Able to explain program exercise prescription;Long Term: Able to explain home exercise prescription to exercise independently          Exercise Goals Re-Evaluation :   Discharge Exercise Prescription (Final Exercise Prescription Changes):   Nutrition:  Target Goals: Understanding of nutrition guidelines, daily intake of sodium 1500mg , cholesterol 200mg , calories 30% from fat and 7% or less from saturated fats, daily to have 5 or more servings of fruits and vegetables.  Biometrics:  Pre Biometrics - 01/03/24 1220       Pre Biometrics  Waist Circumference 43 inches    Hip Circumference 49 inches    Waist to Hip Ratio 0.88 %    Triceps Skinfold 48 mm    % Body Fat 47.7 %    Grip Strength 28 kg    Flexibility 15 in    Single Leg Stand 30 seconds           Nutrition Therapy Plan and Nutrition Goals:   Nutrition Assessments:  MEDIFICTS Score Key: >=70 Need to make dietary changes  40-70 Heart Healthy Diet <= 40 Therapeutic Level Cholesterol Diet    Picture Your Plate Scores: <59 Unhealthy dietary pattern with much room for improvement. 41-50 Dietary pattern unlikely to meet recommendations for good health and room for improvement. 51-60 More healthful dietary pattern, with some room for improvement.  >60 Healthy dietary pattern, although there may be some specific behaviors that could be improved.    Nutrition Goals Re-Evaluation:   Nutrition Goals Re-Evaluation:   Nutrition Goals Discharge (Final Nutrition Goals Re-Evaluation):   Psychosocial: Target Goals: Acknowledge presence or absence of significant depression and/or  stress, maximize coping skills, provide positive support system. Participant is able to verbalize types and ability to use techniques and skills needed for reducing stress and depression.  Initial Review & Psychosocial Screening:  Initial Psych Review & Screening - 01/03/24 1231       Initial Review   Current issues with Current Depression      Family Dynamics   Good Support System? Yes   husband, daughter   Comments Mercy shared that she has feelings of depression regarding her health and how it has impacted her life. She feels as if she has lost friends due to her health decline and mostly stays at home with her family doing activites with them. She is highly motivated for cardiac rehab, she wants to meet new people and change her mindset to start working on herself rather than taking care of others. Resources were offered, denies any need for additional resources at this time.      Barriers   Psychosocial barriers to participate in program There are no identifiable barriers or psychosocial needs.      Screening Interventions   Interventions Encouraged to exercise;Provide feedback about the scores to participant    Expected Outcomes Long Term goal: The participant improves quality of Life and PHQ9 Scores as seen by post scores and/or verbalization of changes;Short Term goal: Identification and review with participant of any Quality of Life or Depression concerns found by scoring the questionnaire.;Long Term Goal: Stressors or current issues are controlled or eliminated.;Short Term goal: Utilizing psychosocial counselor, staff and physician to assist with identification of specific Stressors or current issues interfering with healing process. Setting desired goal for each stressor or current issue identified.          Quality of Life Scores:  Quality of Life - 01/03/24 1235       Quality of Life   Select Quality of Life      Quality of Life Scores   Health/Function Pre 17.93 %     Socioeconomic Pre 18.93 %    Psych/Spiritual Pre 14.71 %    Family Pre 28.8 %    GLOBAL Pre 19.07 %         Scores of 19 and below usually indicate a poorer quality of life in these areas.  A difference of  2-3 points is a clinically meaningful difference.  A difference of 2-3 points in the total score  of the Quality of Life Index has been associated with significant improvement in overall quality of life, self-image, physical symptoms, and general health in studies assessing change in quality of life.  PHQ-9: Review Flowsheet  More data exists      01/03/2024 05/06/2023 12/25/2022 08/18/2022 05/04/2022  Depression screen PHQ 2/9  Decreased Interest 2 2 0 2 3 0  Down, Depressed, Hopeless 2 2 3 3 3  0  PHQ - 2 Score 4 4 3 5 6  0  Altered sleeping 2 3 3 3 3  0  Tired, decreased energy 2 2 2 3 3  0  Change in appetite 3 2 0 2 3 0  Feeling bad or failure about yourself  3 2 2 2 2  0  Trouble concentrating 2 3 2 3 2  0  Moving slowly or fidgety/restless 2 1 0 0 1 0  Suicidal thoughts 1 0 0 1 2 0  PHQ-9 Score 19 17 12 19 22  0  Difficult doing work/chores Somewhat difficult Somewhat difficult Very difficult Somewhat difficult Not difficult at all    Details       Multiple values from one day are sorted in reverse-chronological order        Interpretation of Total Score  Total Score Depression Severity:  1-4 = Minimal depression, 5-9 = Mild depression, 10-14 = Moderate depression, 15-19 = Moderately severe depression, 20-27 = Severe depression   Psychosocial Evaluation and Intervention:   Psychosocial Re-Evaluation:   Psychosocial Discharge (Final Psychosocial Re-Evaluation):   Vocational Rehabilitation: Provide vocational rehab assistance to qualifying candidates.   Vocational Rehab Evaluation & Intervention:   Education: Education Goals: Education classes will be provided on a weekly basis, covering required topics. Participant will state understanding/return demonstration of  topics presented.     Core Videos: Exercise    Move It!  Clinical staff conducted group or individual video education with verbal and written material and guidebook.  Patient learns the recommended Pritikin exercise program. Exercise with the goal of living a long, healthy life. Some of the health benefits of exercise include controlled diabetes, healthier blood pressure levels, improved cholesterol levels, improved heart and lung capacity, improved sleep, and better body composition. Everyone should speak with their doctor before starting or changing an exercise routine.  Biomechanical Limitations Clinical staff conducted group or individual video education with verbal and written material and guidebook.  Patient learns how biomechanical limitations can impact exercise and how we can mitigate and possibly overcome limitations to have an impactful and balanced exercise routine.  Body Composition Clinical staff conducted group or individual video education with verbal and written material and guidebook.  Patient learns that body composition (ratio of muscle mass to fat mass) is a key component to assessing overall fitness, rather than body weight alone. Increased fat mass, especially visceral belly fat, can put us  at increased risk for metabolic syndrome, type 2 diabetes, heart disease, and even death. It is recommended to combine diet and exercise (cardiovascular and resistance training) to improve your body composition. Seek guidance from your physician and exercise physiologist before implementing an exercise routine.  Exercise Action Plan Clinical staff conducted group or individual video education with verbal and written material and guidebook.  Patient learns the recommended strategies to achieve and enjoy long-term exercise adherence, including variety, self-motivation, self-efficacy, and positive decision making. Benefits of exercise include fitness, good health, weight management, more  energy, better sleep, less stress, and overall well-being.  Medical   Heart Disease Risk Reduction Clinical staff conducted group  or individual video education with verbal and written material and guidebook.  Patient learns our heart is our most vital organ as it circulates oxygen , nutrients, white blood cells, and hormones throughout the entire body, and carries waste away. Data supports a plant-based eating plan like the Pritikin Program for its effectiveness in slowing progression of and reversing heart disease. The video provides a number of recommendations to address heart disease.   Metabolic Syndrome and Belly Fat  Clinical staff conducted group or individual video education with verbal and written material and guidebook.  Patient learns what metabolic syndrome is, how it leads to heart disease, and how one can reverse it and keep it from coming back. You have metabolic syndrome if you have 3 of the following 5 criteria: abdominal obesity, high blood pressure, high triglycerides, low HDL cholesterol, and high blood sugar.  Hypertension and Heart Disease Clinical staff conducted group or individual video education with verbal and written material and guidebook.  Patient learns that high blood pressure, or hypertension, is very common in the [place] . Hypertension is largely due to excessive salt intake, but other important risk factors include being overweight, physical inactivity, drinking too much alcohol, smoking, and not eating enough potassium from fruits and vegetables. High blood pressure is a leading risk factor for heart attack, stroke, congestive heart failure, dementia, kidney failure, and premature death. Long-term effects of excessive salt intake include stiffening of the arteries and thickening of heart muscle and organ damage. Recommendations include ways to reduce hypertension and the risk of heart disease.  Diseases of Our Time - Focusing on Diabetes Clinical staff  conducted group or individual video education with verbal and written material and guidebook.  Patient learns why the best way to stop diseases of our time is prevention, through food and other lifestyle changes. Medicine (such as prescription pills and surgeries) is often only a Band-Aid on the problem, not a long-term solution. Most common diseases of our time include obesity, type 2 diabetes, hypertension, heart disease, and cancer. The Pritikin Program is recommended and has been proven to help reduce, reverse, and/or prevent the damaging effects of metabolic syndrome.  Nutrition   Overview of the Pritikin Eating Plan  Clinical staff conducted group or individual video education with verbal and written material and guidebook.  Patient learns about the Pritikin Eating Plan for disease risk reduction. The Pritikin Eating Plan emphasizes a wide variety of unrefined, minimally-processed carbohydrates, like fruits, vegetables, whole grains, and legumes. Go, Caution, and Stop food choices are explained. Plant-based and lean animal proteins are emphasized. Rationale provided for low sodium intake for blood pressure control, low added sugars for blood sugar stabilization, and low added fats and oils for coronary artery disease risk reduction and weight management.  Calorie Density  Clinical staff conducted group or individual video education with verbal and written material and guidebook.  Patient learns about calorie density and how it impacts the Pritikin Eating Plan. Knowing the characteristics of the food you choose will help you decide whether those foods will lead to weight gain or weight loss, and whether you want to consume more or less of them. Weight loss is usually a side effect of the Pritikin Eating Plan because of its focus on low calorie-dense foods.  Label Reading  Clinical staff conducted group or individual video education with verbal and written material and guidebook.  Patient learns  about the Pritikin recommended label reading guidelines and corresponding recommendations regarding calorie density, added sugars, sodium content, and  whole grains.  Dining Out - Part 1  Clinical staff conducted group or individual video education with verbal and written material and guidebook.  Patient learns that restaurant meals can be sabotaging because they can be so high in calories, fat, sodium, and/or sugar. Patient learns recommended strategies on how to positively address this and avoid unhealthy pitfalls.  Facts on Fats  Clinical staff conducted group or individual video education with verbal and written material and guidebook.  Patient learns that lifestyle modifications can be just as effective, if not more so, as many medications for lowering your risk of heart disease. A Pritikin lifestyle can help to reduce your risk of inflammation and atherosclerosis (cholesterol build-up, or plaque, in the artery walls). Lifestyle interventions such as dietary choices and physical activity address the cause of atherosclerosis. A review of the types of fats and their impact on blood cholesterol levels, along with dietary recommendations to reduce fat intake is also included.  Nutrition Action Plan  Clinical staff conducted group or individual video education with verbal and written material and guidebook.  Patient learns how to incorporate Pritikin recommendations into their lifestyle. Recommendations include planning and keeping personal health goals in mind as an important part of their success.  Healthy Mind-Set    Healthy Minds, Bodies, Hearts  Clinical staff conducted group or individual video education with verbal and written material and guidebook.  Patient learns how to identify when they are stressed. Video will discuss the impact of that stress, as well as the many benefits of stress management. Patient will also be introduced to stress management techniques. The way we think, act, and  feel has an impact on our hearts.  How Our Thoughts Can Heal Our Hearts  Clinical staff conducted group or individual video education with verbal and written material and guidebook.  Patient learns that negative thoughts can cause depression and anxiety. This can result in negative lifestyle behavior and serious health problems. Cognitive behavioral therapy is an effective method to help control our thoughts in order to change and improve our emotional outlook.  Additional Videos:  Exercise    Improving Performance  Clinical staff conducted group or individual video education with verbal and written material and guidebook.  Patient learns to use a non-linear approach by alternating intensity levels and lengths of time spent exercising to help burn more calories and lose more body fat. Cardiovascular exercise helps improve heart health, metabolism, hormonal balance, blood sugar control, and recovery from fatigue. Resistance training improves strength, endurance, balance, coordination, reaction time, metabolism, and muscle mass. Flexibility exercise improves circulation, posture, and balance. Seek guidance from your physician and exercise physiologist before implementing an exercise routine and learn your capabilities and proper form for all exercise.  Introduction to Yoga  Clinical staff conducted group or individual video education with verbal and written material and guidebook.  Patient learns about yoga, a discipline of the coming together of mind, breath, and body. The benefits of yoga include improved flexibility, improved range of motion, better posture and core strength, increased lung function, weight loss, and positive self-image. Yoga's heart health benefits include lowered blood pressure, healthier heart rate, decreased cholesterol and triglyceride levels, improved immune function, and reduced stress. Seek guidance from your physician and exercise physiologist before implementing an exercise  routine and learn your capabilities and proper form for all exercise.  Medical   Aging: Enhancing Your Quality of Life  Clinical staff conducted group or individual video education with verbal and written material and guidebook.  Patient learns key strategies and recommendations to stay in good physical health and enhance quality of life, such as prevention strategies, having an advocate, securing a Health Care Proxy and Power of Attorney, and keeping a list of medications and system for tracking them. It also discusses how to avoid risk for bone loss.  Biology of Weight Control  Clinical staff conducted group or individual video education with verbal and written material and guidebook.  Patient learns that weight gain occurs because we consume more calories than we burn (eating more, moving less). Even if your body weight is normal, you may have higher ratios of fat compared to muscle mass. Too much body fat puts you at increased risk for cardiovascular disease, heart attack, stroke, type 2 diabetes, and obesity-related cancers. In addition to exercise, following the Pritikin Eating Plan can help reduce your risk.  Decoding Lab Results  Clinical staff conducted group or individual video education with verbal and written material and guidebook.  Patient learns that lab test reflects one measurement whose values change over time and are influenced by many factors, including medication, stress, sleep, exercise, food, hydration, pre-existing medical conditions, and more. It is recommended to use the knowledge from this video to become more involved with your lab results and evaluate your numbers to speak with your doctor.   Diseases of Our Time - Overview  Clinical staff conducted group or individual video education with verbal and written material and guidebook.  Patient learns that according to the CDC, 50% to 70% of chronic diseases (such as obesity, type 2 diabetes, elevated lipids, hypertension,  and heart disease) are avoidable through lifestyle improvements including healthier food choices, listening to satiety cues, and increased physical activity.  Sleep Disorders Clinical staff conducted group or individual video education with verbal and written material and guidebook.  Patient learns how good quality and duration of sleep are important to overall health and well-being. Patient also learns about sleep disorders and how they impact health along with recommendations to address them, including discussing with a physician.  Nutrition  Dining Out - Part 2 Clinical staff conducted group or individual video education with verbal and written material and guidebook.  Patient learns how to plan ahead and communicate in order to maximize their dining experience in a healthy and nutritious manner. Included are recommended food choices based on the type of restaurant the patient is visiting.   Fueling a Banker conducted group or individual video education with verbal and written material and guidebook.  There is a strong connection between our food choices and our health. Diseases like obesity and type 2 diabetes are very prevalent and are in large-part due to lifestyle choices. The Pritikin Eating Plan provides plenty of food and hunger-curbing satisfaction. It is easy to follow, affordable, and helps reduce health risks.  Menu Workshop  Clinical staff conducted group or individual video education with verbal and written material and guidebook.  Patient learns that restaurant meals can sabotage health goals because they are often packed with calories, fat, sodium, and sugar. Recommendations include strategies to plan ahead and to communicate with the manager, chef, or server to help order a healthier meal.  Planning Your Eating Strategy  Clinical staff conducted group or individual video education with verbal and written material and guidebook.  Patient learns about the  Pritikin Eating Plan and its benefit of reducing the risk of disease. The Pritikin Eating Plan does not focus on calories. Instead, it emphasizes high-quality, nutrient-rich  foods. By knowing the characteristics of the foods, we choose, we can determine their calorie density and make informed decisions.  Targeting Your Nutrition Priorities  Clinical staff conducted group or individual video education with verbal and written material and guidebook.  Patient learns that lifestyle habits have a tremendous impact on disease risk and progression. This video provides eating and physical activity recommendations based on your personal health goals, such as reducing LDL cholesterol, losing weight, preventing or controlling type 2 diabetes, and reducing high blood pressure.  Vitamins and Minerals  Clinical staff conducted group or individual video education with verbal and written material and guidebook.  Patient learns different ways to obtain key vitamins and minerals, including through a recommended healthy diet. It is important to discuss all supplements you take with your doctor.   Healthy Mind-Set    Smoking Cessation  Clinical staff conducted group or individual video education with verbal and written material and guidebook.  Patient learns that cigarette smoking and tobacco addiction pose a serious health risk which affects millions of people. Stopping smoking will significantly reduce the risk of heart disease, lung disease, and many forms of cancer. Recommended strategies for quitting are covered, including working with your doctor to develop a successful plan.  Culinary   Becoming a Set designer conducted group or individual video education with verbal and written material and guidebook.  Patient learns that cooking at home can be healthy, cost-effective, quick, and puts them in control. Keys to cooking healthy recipes will include looking at your recipe, assessing your  equipment needs, planning ahead, making it simple, choosing cost-effective seasonal ingredients, and limiting the use of added fats, salts, and sugars.  Cooking - Breakfast and Snacks  Clinical staff conducted group or individual video education with verbal and written material and guidebook.  Patient learns how important breakfast is to satiety and nutrition through the entire day. Recommendations include key foods to eat during breakfast to help stabilize blood sugar levels and to prevent overeating at meals later in the day. Planning ahead is also a key component.  Cooking - Educational psychologist conducted group or individual video education with verbal and written material and guidebook.  Patient learns eating strategies to improve overall health, including an approach to cook more at home. Recommendations include thinking of animal protein as a side on your plate rather than center stage and focusing instead on lower calorie dense options like vegetables, fruits, whole grains, and plant-based proteins, such as beans. Making sauces in large quantities to freeze for later and leaving the skin on your vegetables are also recommended to maximize your experience.  Cooking - Healthy Salads and Dressing Clinical staff conducted group or individual video education with verbal and written material and guidebook.  Patient learns that vegetables, fruits, whole grains, and legumes are the foundations of the Pritikin Eating Plan. Recommendations include how to incorporate each of these in flavorful and healthy salads, and how to create homemade salad dressings. Proper handling of ingredients is also covered. Cooking - Soups and [employer] - Soups and Desserts Clinical staff conducted group or individual video education with verbal and written material and guidebook.  Patient learns that Pritikin soups and desserts make for easy, nutritious, and delicious snacks and meal components that are  low in sodium, fat, sugar, and calorie density, while high in vitamins, minerals, and filling fiber. Recommendations include simple and healthy ideas for soups and desserts.   Overview  The Pritikin Solution Program Overview Clinical staff conducted group or individual video education with verbal and written material and guidebook.  Patient learns that the results of the Pritikin Program have been documented in more than 100 articles published in peer-reviewed journals, and the benefits include reducing risk factors for (and, in some cases, even reversing) high cholesterol, high blood pressure, type 2 diabetes, obesity, and more! An overview of the three key pillars of the Pritikin Program will be covered: eating well, doing regular exercise, and having a healthy mind-set.  WORKSHOPS  Exercise: Exercise Basics: Building Your Action Plan Clinical staff led group instruction and group discussion with PowerPoint presentation and patient guidebook. To enhance the learning environment the use of posters, models and videos may be added. At the conclusion of this workshop, patients will comprehend the difference between physical activity and exercise, as well as the benefits of incorporating both, into their routine. Patients will understand the FITT (Frequency, Intensity, Time, and Type) principle and how to use it to build an exercise action plan. In addition, safety concerns and other considerations for exercise and cardiac rehab will be addressed by the presenter. The purpose of this lesson is to promote a comprehensive and effective weekly exercise routine in order to improve patients' overall level of fitness.   Managing Heart Disease: Your Path to a Healthier Heart Clinical staff led group instruction and group discussion with PowerPoint presentation and patient guidebook. To enhance the learning environment the use of posters, models and videos may be added.At the conclusion of this workshop,  patients will understand the anatomy and physiology of the heart. Additionally, they will understand how Pritikin's three pillars impact the risk factors, the progression, and the management of heart disease.  The purpose of this lesson is to provide a high-level overview of the heart, heart disease, and how the Pritikin lifestyle positively impacts risk factors.  Exercise Biomechanics Clinical staff led group instruction and group discussion with PowerPoint presentation and patient guidebook. To enhance the learning environment the use of posters, models and videos may be added. Patients will learn how the structural parts of their bodies function and how these functions impact their daily activities, movement, and exercise. Patients will learn how to promote a neutral spine, learn how to manage pain, and identify ways to improve their physical movement in order to promote healthy living. The purpose of this lesson is to expose patients to common physical limitations that impact physical activity. Participants will learn practical ways to adapt and manage aches and pains, and to minimize their effect on regular exercise. Patients will learn how to maintain good posture while sitting, walking, and lifting.  Balance Training and Fall Prevention  Clinical staff led group instruction and group discussion with PowerPoint presentation and patient guidebook. To enhance the learning environment the use of posters, models and videos may be added. At the conclusion of this workshop, patients will understand the importance of their sensorimotor skills (vision, proprioception, and the vestibular system) in maintaining their ability to balance as they age. Patients will apply a variety of balancing exercises that are appropriate for their current level of function. Patients will understand the common causes for poor balance, possible solutions to these problems, and ways to modify their physical environment  in order to minimize their fall risk. The purpose of this lesson is to teach patients about the importance of maintaining balance as they age and ways to minimize their risk of falling.  WORKSHOPS   Nutrition:  Fueling  a Healthy Body Clinical staff led group instruction and group discussion with PowerPoint presentation and patient guidebook. To enhance the learning environment the use of posters, models and videos may be added. Patients will review the foundational principles of the Pritikin Eating Plan and understand what constitutes a serving size in each of the food groups. Patients will also learn Pritikin-friendly foods that are better choices when away from home and review make-ahead meal and snack options. Calorie density will be reviewed and applied to three nutrition priorities: weight maintenance, weight loss, and weight gain. The purpose of this lesson is to reinforce (in a group setting) the key concepts around what patients are recommended to eat and how to apply these guidelines when away from home by planning and selecting Pritikin-friendly options. Patients will understand how calorie density may be adjusted for different weight management goals.  Mindful Eating  Clinical staff led group instruction and group discussion with PowerPoint presentation and patient guidebook. To enhance the learning environment the use of posters, models and videos may be added. Patients will briefly review the concepts of the Pritikin Eating Plan and the importance of low-calorie dense foods. The concept of mindful eating will be introduced as well as the importance of paying attention to internal hunger signals. Triggers for non-hunger eating and techniques for dealing with triggers will be explored. The purpose of this lesson is to provide patients with the opportunity to review the basic principles of the Pritikin Eating Plan, discuss the value of eating mindfully and how to measure internal cues of hunger  and fullness using the Hunger Scale. Patients will also discuss reasons for non-hunger eating and learn strategies to use for controlling emotional eating.  Targeting Your Nutrition Priorities Clinical staff led group instruction and group discussion with PowerPoint presentation and patient guidebook. To enhance the learning environment the use of posters, models and videos may be added. Patients will learn how to determine their genetic susceptibility to disease by reviewing their family history. Patients will gain insight into the importance of diet as part of an overall healthy lifestyle in mitigating the impact of genetics and other environmental insults. The purpose of this lesson is to provide patients with the opportunity to assess their personal nutrition priorities by looking at their family history, their own health history and current risk factors. Patients will also be able to discuss ways of prioritizing and modifying the Pritikin Eating Plan for their highest risk areas  Menu  Clinical staff led group instruction and group discussion with PowerPoint presentation and patient guidebook. To enhance the learning environment the use of posters, models and videos may be added. Using menus brought in from [employer], or printed from [employer], patients will apply the Pritikin dining out guidelines that were presented in the [employer] video. Patients will also be able to practice these guidelines in a variety of provided scenarios. The purpose of this lesson is to provide patients with the opportunity to practice hands-on learning of the Pritikin Dining Out guidelines with actual menus and practice scenarios.  Label Reading Clinical staff led group instruction and group discussion with PowerPoint presentation and patient guidebook. To enhance the learning environment the use of posters, models and videos may be added. Patients will review and discuss the Pritikin label  reading guidelines presented in Pritikin's Label Reading Educational series video. Using fool labels brought in from local grocery stores and markets, patients will apply the label reading guidelines and determine if the packaged food  meet the Pritikin guidelines. The purpose of this lesson is to provide patients with the opportunity to review, discuss, and practice hands-on learning of the Pritikin Label Reading guidelines with actual packaged food labels. Cooking School  Pritikin's [employer] are designed to teach patients ways to prepare quick, simple, and affordable recipes at home. The importance of nutrition's role in chronic disease risk reduction is reflected in its emphasis in the overall Pritikin program. By learning how to prepare essential core Pritikin Eating Plan recipes, patients will increase control over what they eat; be able to customize the flavor of foods without the use of added salt, sugar, or fat; and improve the quality of the food they consume. By learning a set of core recipes which are easily assembled, quickly prepared, and affordable, patients are more likely to prepare more healthy foods at home. These workshops focus on convenient breakfasts, simple entres, side dishes, and desserts which can be prepared with minimal effort and are consistent with nutrition recommendations for cardiovascular risk reduction. Cooking [employer] are taught by a Armed forces logistics/support/administrative officer (RD) who has been trained by the [employer]. The chef or RD has a clear understanding of the importance of minimizing - if not completely eliminating - added fat, sugar, and sodium in recipes. Throughout the series of Cooking School Workshop sessions, patients will learn about healthy ingredients and efficient methods of cooking to build confidence in their capability to prepare    Cooking School weekly topics:  Adding Flavor- Sodium-Free  Fast and Healthy  Breakfasts  Powerhouse Plant-Based Proteins  Satisfying Salads and Dressings  Simple Sides and Sauces  International Cuisine-Spotlight on the [employer] Zones  Delicious Desserts  Savory Soups  [employer] - Meals in a Astronomer Appetizers and Snacks  Comforting Weekend Breakfasts  One-Pot Wonders   Fast Evening Meals  Landscape architect Your Pritikin Plate  WORKSHOPS   Healthy Mindset (Psychosocial):  Focused Goals, Sustainable Changes Clinical staff led group instruction and group discussion with PowerPoint presentation and patient guidebook. To enhance the learning environment the use of posters, models and videos may be added. Patients will be able to apply effective goal setting strategies to establish at least one personal goal, and then take consistent, meaningful action toward that goal. They will learn to identify common barriers to achieving personal goals and develop strategies to overcome them. Patients will also gain an understanding of how our mind-set can impact our ability to achieve goals and the importance of cultivating a positive and growth-oriented mind-set. The purpose of this lesson is to provide patients with a deeper understanding of how to set and achieve personal goals, as well as the tools and strategies needed to overcome common obstacles which may arise along the way.  From Head to Heart: The Power of a Healthy Outlook  Clinical staff led group instruction and group discussion with PowerPoint presentation and patient guidebook. To enhance the learning environment the use of posters, models and videos may be added. Patients will be able to recognize and describe the impact of emotions and mood on physical health. They will discover the importance of self-care and explore self-care practices which may work for them. Patients will also learn how to utilize the 4 C's to cultivate a healthier outlook and better manage stress and challenges. The  purpose of this lesson is to demonstrate to patients how a healthy outlook is an essential part of maintaining good health, especially as they continue  their cardiac rehab journey.  Healthy Sleep for a Healthy Heart Clinical staff led group instruction and group discussion with PowerPoint presentation and patient guidebook. To enhance the learning environment the use of posters, models and videos may be added. At the conclusion of this workshop, patients will be able to demonstrate knowledge of the importance of sleep to overall health, well-being, and quality of life. They will understand the symptoms of, and treatments for, common sleep disorders. Patients will also be able to identify daytime and nighttime behaviors which impact sleep, and they will be able to apply these tools to help manage sleep-related challenges. The purpose of this lesson is to provide patients with a general overview of sleep and outline the importance of quality sleep. Patients will learn about a few of the most common sleep disorders. Patients will also be introduced to the concept of "sleep hygiene," and discover ways to self-manage certain sleeping problems through simple daily behavior changes. Finally, the workshop will motivate patients by clarifying the links between quality sleep and their goals of heart-healthy living.   Recognizing and Reducing Stress Clinical staff led group instruction and group discussion with PowerPoint presentation and patient guidebook. To enhance the learning environment the use of posters, models and videos may be added. At the conclusion of this workshop, patients will be able to understand the types of stress reactions, differentiate between acute and chronic stress, and recognize the impact that chronic stress has on their health. They will also be able to apply different coping mechanisms, such as reframing negative self-talk. Patients will have the opportunity to practice a variety of stress  management techniques, such as deep abdominal breathing, progressive muscle relaxation, and/or guided imagery.  The purpose of this lesson is to educate patients on the role of stress in their lives and to provide healthy techniques for coping with it.  Learning Barriers/Preferences:   Education Topics:  Knowledge Questionnaire Score:   Core Components/Risk Factors/Patient Goals at Admission:   Core Components/Risk Factors/Patient Goals Review:    Core Components/Risk Factors/Patient Goals at Discharge (Final Review):    ITP Comments:  ITP Comments     Row Name 01/03/24 1018           ITP Comments Dr. Wilbert Bihari medical director. Introduction to [facility] education/intensive cardiac rehab. Initial orientation packet reviewed with patient.          Comments: Participant attended orientation for the cardiac rehabilitation program on  01/03/2024  to perform initial intake and exercise walk test. Patient introduced to the [facility] education and orientation packet was reviewed. Completed 6-minute walk test, measurements, initial ITP, and exercise prescription. Vital signs stable. Telemetry-normal sinus rhythm PVC's, asymptomatic. Pt did not take meds prior to today's appt, stating she had an appt before this and couldn't take them for that and did not have time to return home to take them before coming to orientation. She understands to take all meds prior to exercise appts from here on. Pt will be contacted regarding PHQ-9 scores.   Service time was from 1020 to 1200.  Con KATHEE Pereyra, MS, ACSM-CEP 01/03/2024 12:41 PM

## 2024-01-03 NOTE — Assessment & Plan Note (Signed)
 Obesity with weight regain post-gastric sleeve surgery in 2019. Initial weight loss to 182-187 lbs, with subsequent regain starting around 2020. Current body fat percentage is 43%, with a goal to reduce to 34% or less. Contributing factors include sleep apnea, heart medications, menopause, and chronic meal skipping. Metabolic rate slightly slower than expected. - Implement a 1000-calorie meal plan with three meals a day, focusing on whole foods and reducing processed foods. - Increase protein intake to 90 grams per day, with 30 grams per meal. - Prioritize protein and vegetables in meals. - Avoid cooking with oils and butters; use sprays instead. - Limit sodium intake to 2000 mg per day. - Encourage physical activity and cardiac rehab participation. - Monitor weight and body composition regularly.

## 2024-01-04 ENCOUNTER — Encounter: Payer: Self-pay | Admitting: Internal Medicine

## 2024-01-04 DIAGNOSIS — F331 Major depressive disorder, recurrent, moderate: Secondary | ICD-10-CM

## 2024-01-04 LAB — VITAMIN D 25 HYDROXY (VIT D DEFICIENCY, FRACTURES): Vit D, 25-Hydroxy: 24.1 ng/mL — ABNORMAL LOW (ref 30.0–100.0)

## 2024-01-04 LAB — VITAMIN B12: Vitamin B-12: 216 pg/mL — ABNORMAL LOW (ref 232–1245)

## 2024-01-04 LAB — INSULIN, RANDOM: INSULIN: 11.8 u[IU]/mL (ref 2.6–24.9)

## 2024-01-05 ENCOUNTER — Telehealth (HOSPITAL_COMMUNITY): Payer: Self-pay | Admitting: *Deleted

## 2024-01-05 NOTE — Telephone Encounter (Signed)
 [name] returned my call with message left on departmental voicemail.  Called and spoke to [name] who was exercising at they gym.  Clarified with her the response she gave on the [facility] for the prompt forthoughts you would be better off dead or of hurting yourself in some way.  Indicated on our form 1 but previously reported 0 for early am appt on the same day. Clarified with [name] that this was a mistake in circling.  Admits she previously struggled with these thoughts in the past but now rarely has these thoughts and even with the rare occasions does not have any plan or the means to carry this out. Offered  counseling support through [employer].  Would like to think about it.  Has the support of her daughter and husband.  Will continue to check in periodically with [name] and intervene as needed. Saturnino Bernett PEAK, BSN Cardiac and Emergency planning/management officer

## 2024-01-06 ENCOUNTER — Other Ambulatory Visit: Payer: Self-pay | Admitting: Internal Medicine

## 2024-01-10 ENCOUNTER — Encounter (HOSPITAL_COMMUNITY)
Admission: RE | Admit: 2024-01-10 | Discharge: 2024-01-10 | Disposition: A | Discharge status: Home / Self Care | Source: Ambulatory Visit | Attending: Internal Medicine

## 2024-01-10 DIAGNOSIS — I5042 Chronic combined systolic (congestive) and diastolic (congestive) heart failure: Secondary | ICD-10-CM

## 2024-01-10 NOTE — Progress Notes (Signed)
 Daily Session Note  Patient Details  Name: [name] MRN: [id number] Date of Birth: 1976/03/02 Referring Provider:   Flowsheet Row INTENSIVE CARDIAC REHAB ORIENT from 01/03/2024 in [facility]  Referring Provider [name], MD    Encounter Date: 01/10/2024  Check In:  Session Check In - 01/10/24 1314       Check-In   Supervising physician immediately available to respond to emergencies [facility] MD immediately available    Physician(s) Lum Ran NP    Location [facility]    Staff Present Con Pereyra, MS, Exercise Physiologist;Jetta Vannie BS, ACSM-CEP, Exercise Physiologist;Maria Whitaker, RN, Valere Music, RN, Avonne Gal, MS, ACSM-CEP, Exercise Physiologist;David Janann, MS, ACSM-CEP, CCRP, Exercise Physiologist    Virtual Visit No    Medication changes reported     No    Fall or balance concerns reported    No    Tobacco Cessation No Change    Warm-up and Cool-down Performed as group-led instruction    Resistance Training Performed Yes    VAD Patient? No    PAD/SET Patient? No      Pain Assessment   Currently in Pain? No/denies    Pain Score 0-No pain    Multiple Pain Sites No          Capillary Blood Glucose: No results found. However, due to the size of the patient record, not all encounters were searched. Please check Results Review for a complete set of results.   Exercise Prescription Changes - 01/10/24 1600       Response to Exercise   Blood Pressure (Admit) 122/72    Blood Pressure (Exercise) 140/82    Blood Pressure (Exit) 104/60    Heart Rate (Admit) 87 bpm    Heart Rate (Exercise) 135 bpm    Heart Rate (Exit) 86 bpm    Rating of Perceived Exertion (Exercise) 8    Symptoms None    Comments Pt's first day in the [facility]2 program    Duration Continue with 30 min of aerobic exercise without signs/symptoms of physical distress.    Intensity THRR unchanged       Progression   Progression Continue to progress workloads to maintain intensity without signs/symptoms of physical distress.    Average METs 3      Resistance Training   Training Prescription Yes    Weight 2 lbs    Reps 10-15    Time 5 Minutes      Interval Training   Interval Training No      NuStep   Level 1    SPM 99    Minutes 15    METs 2.7      Track   Laps 18    Minutes 15    METs 3.3          Social History   Tobacco Use  Smoking Status Never   Passive exposure: Never  Smokeless Tobacco Never    Goals Met:  Exercise tolerated well No report of concerns or symptoms today Strength training completed today  Goals Unmet:  Not Applicable  Comments: Pt started cardiac rehab today.  Pt tolerated light exercise without difficulty. VSS, telemetry-NSR (pt did mention she has a pain stimulator, and telemetry did look as if it had some artifact, but P, Q, T waves are all present), asymptomatic.  Medication list reconciled. Pt denies barriers to medication compliance.  PSYCHOSOCIAL ASSESSMENT:  PHQ-19. Pt exhibits positive coping  skills, hopeful outlook with supportive family. No psychosocial needs identified at this time, no psychosocial interventions necessary.    Pt enjoys sewing, kniting, shopping, spending time with daughter and nephew.   Pt oriented to exercise equipment and routine.    Understanding verbalized.     Dr. Wilbert Bihari is Medical Director for Cardiac Rehab at [facility].

## 2024-01-11 ENCOUNTER — Other Ambulatory Visit (HOSPITAL_COMMUNITY): Payer: Self-pay

## 2024-01-11 ENCOUNTER — Ambulatory Visit: Admitting: Internal Medicine

## 2024-01-11 ENCOUNTER — Telehealth (HOSPITAL_COMMUNITY): Payer: Self-pay

## 2024-01-11 ENCOUNTER — Telehealth: Payer: Self-pay

## 2024-01-11 LAB — GLUCOSE, CAPILLARY
Glucose-Capillary: 93 mg/dL (ref 70–99)
Glucose-Capillary: 88 mg/dL (ref 70–99)

## 2024-01-11 NOTE — Telephone Encounter (Signed)
 Pharmacy Patient Advocate Encounter  Received notification from [employer] that Prior Authorization for Zepbound  5mg /0.72ml  has been DENIED.  Full denial letter will be uploaded to the media tab. See denial reason below.   PA #/Case ID/Reference #: [id number]

## 2024-01-11 NOTE — Telephone Encounter (Signed)
 Pharmacy Patient Advocate Encounter   Received notification from Patient Pharmacy that prior authorization for Zepbound  5mg /0.57ml is required/requested.   Insurance verification completed.   The patient is insured through [facility] [employer] .   Per test claim: PA required; PA submitted to above mentioned insurance via Latent Key/confirmation #/EOC [id number] Status is pending

## 2024-01-11 NOTE — Telephone Encounter (Signed)
 Patient c/o for 12:30 CR class on 8/22, stated she has a cardiology appt that day.

## 2024-01-12 ENCOUNTER — Encounter (HOSPITAL_COMMUNITY)
Admission: RE | Admit: 2024-01-12 | Discharge: 2024-01-12 | Discharge status: Home / Self Care | Source: Ambulatory Visit | Attending: Internal Medicine

## 2024-01-12 DIAGNOSIS — I5042 Chronic combined systolic (congestive) and diastolic (congestive) heart failure: Secondary | ICD-10-CM

## 2024-01-12 LAB — GLUCOSE, CAPILLARY
Glucose-Capillary: 108 mg/dL — ABNORMAL HIGH (ref 70–99)
Glucose-Capillary: 105 mg/dL — ABNORMAL HIGH (ref 70–99)

## 2024-01-14 ENCOUNTER — Encounter (HOSPITAL_COMMUNITY)

## 2024-01-14 DIAGNOSIS — I493 Ventricular premature depolarization: Secondary | ICD-10-CM | POA: Diagnosis not present

## 2024-01-14 DIAGNOSIS — Z95810 Presence of automatic (implantable) cardiac defibrillator: Secondary | ICD-10-CM | POA: Diagnosis not present

## 2024-01-17 ENCOUNTER — Encounter (INDEPENDENT_AMBULATORY_CARE_PROVIDER_SITE_OTHER): Payer: Self-pay | Admitting: Internal Medicine

## 2024-01-17 ENCOUNTER — Ambulatory Visit (INDEPENDENT_AMBULATORY_CARE_PROVIDER_SITE_OTHER): Admitting: Internal Medicine

## 2024-01-17 ENCOUNTER — Encounter (HOSPITAL_COMMUNITY)
Admission: RE | Admit: 2024-01-17 | Discharge: 2024-01-17 | Disposition: A | Discharge status: Home / Self Care | Source: Ambulatory Visit | Attending: Internal Medicine

## 2024-01-17 VITALS — BP 124/84 | HR 77 | Temp 97.9°F | Ht 65.0 in | Wt 212.0 lb

## 2024-01-17 DIAGNOSIS — E559 Vitamin D deficiency, unspecified: Secondary | ICD-10-CM

## 2024-01-17 DIAGNOSIS — E66812 Obesity, class 2: Secondary | ICD-10-CM | POA: Diagnosis not present

## 2024-01-17 DIAGNOSIS — E538 Deficiency of other specified B group vitamins: Secondary | ICD-10-CM

## 2024-01-17 DIAGNOSIS — Z6835 Body mass index (BMI) 35.0-35.9, adult: Secondary | ICD-10-CM

## 2024-01-17 DIAGNOSIS — I10 Essential (primary) hypertension: Secondary | ICD-10-CM

## 2024-01-17 DIAGNOSIS — I5042 Chronic combined systolic (congestive) and diastolic (congestive) heart failure: Secondary | ICD-10-CM | POA: Diagnosis not present

## 2024-01-17 DIAGNOSIS — E611 Iron deficiency: Secondary | ICD-10-CM | POA: Insufficient documentation

## 2024-01-17 DIAGNOSIS — Z1331 Encounter for screening for depression: Secondary | ICD-10-CM

## 2024-01-17 DIAGNOSIS — I428 Other cardiomyopathies: Secondary | ICD-10-CM

## 2024-01-17 DIAGNOSIS — Z9884 Bariatric surgery status: Secondary | ICD-10-CM

## 2024-01-17 NOTE — Assessment & Plan Note (Signed)
 Moderate to severe depression with a PHQ-9 score of 17. Depression may be influenced by cardiac medications, life changes, and personal history. She is seeking a referral to a psychiatrist for further management. Discussed the potential impact of exercise on depression and the importance of finding the right treatment. - Follow up on referral to psychiatrist for depression management. - Consider counseling and potential medication adjustments.

## 2024-01-17 NOTE — Assessment & Plan Note (Signed)
 Obesity status post gastric sleeve Complex obesity with a history of gastric sleeve. Current weight management plan includes a 1000 calorie meal plan and increased physical activity. Body composition analysis shows an increase in muscle mass and a decrease in body fat percentage, indicating positive changes despite stable weight. Discussed the importance of maintaining a calorie intake below 1500 to continue weight loss. Emphasized the role of physical activity, including non-exercise activity and structured exercise, in weight management. Discussed the potential role of anti-obesity medications but noted good appetite control with current regimen. - Continue 1000 calorie meal plan. - Encourage physical activity, aiming for 240 minutes per week, including 150 minutes of aerobic exercise and 2-3 sessions of strength training. - Monitor weight and body composition changes. - Reassess in 3 weeks to evaluate progress and consider medication if necessary.

## 2024-01-17 NOTE — Assessment & Plan Note (Signed)
 Patient has a low ferritin level with normal serum iron .  I recommend she start a bariatric vitamin as she has several nutritional deficiencies.  Bariatric vitamins have iron  in it also high-dose B12 and vitamin D .

## 2024-01-17 NOTE — Assessment & Plan Note (Signed)
 Blood pressure  is at goal for age and risk category.  On metoprolol , spironolactone , loop diuretic, Jardiance  and Entresto  without adverse effects.  Most recent renal parameters reviewed which showed stable electrolytes and kidney function.  Continue with weight loss therapy. Losing 10% may improve blood pressure control. Monitor for symptoms of orthostasis while losing weight. Continue current regimen and home monitoring for a goal blood pressure of < 120/80.   Patient advised to reduce sodium in her diet we also have to monitor diuretics closely as she will be at risk for orthostasis as she starts to lose weight.

## 2024-01-17 NOTE — Assessment & Plan Note (Signed)
 Patient has low vitamin D  level despite taking high-dose supplementation.  She will be starting a bariatric vitamin.  She does not have any other symptoms of celiac disease and has not been evaluated for this in the past.  Her malabsorption could be due to gastric sleeve surgery but is uncommon.

## 2024-01-17 NOTE — Assessment & Plan Note (Signed)
 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 She is attending cardiac rehab and has increased physical activity, which is beneficial for heart function. Jardiance  is used for heart failure and has a diuretic effect, aiding in weight loss and heart function improvement. Discussed potential side effects such as bladder infections due to its glycosuria effect - Continue current cardiac medications. - Attend cardiac rehab sessions regularly. - Monitor for signs of fluid retention and adjust Torsen as needed. - Discuss potential medication adjustments with cardiologist as heart function improves.

## 2024-01-17 NOTE — Progress Notes (Unsigned)
 [name] [address] [place] [address] Phone: [phone] Subjective:   [name], am serving as a scribe for Dr. Arthea [name].  I'm seeing this patient by the request  of:  Megan Almarie LABOR, MD  CC: Back and neck pain follow-up  [name]  [name] is a 48 y.o. female coming in with complaint of back and neck pain. OMT 11/24/2023. Patient states that she has been doing doing a lot of physical activity. Has to walk for cardiac rehab. On the 16th lap she has pain in the lateral aspect and over the patella. Lunges in warm ups and this hurts.   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  NEED B12 INJECTION  Reviewed prior external information including notes and imaging from previsou exam, outside providers and external EMR if available.   As well as notes that were available from care everywhere and other healthcare systems.  Past medical history, social, surgical and family history all reviewed in electronic medical record.  No pertanent information unless stated regarding to the chief complaint.   Past Medical History:  Diagnosis Date   Acute on chronic systolic (congestive) heart failure (HCC) 04/29/2017   Acute pain of right shoulder 06/16/2017   ADHD    AICD (automatic cardioverter/defibrillator) present    Anginal pain (HCC)    Anxiety    Arthritis    right shoulder    Asthma    Back pain    CHF (congestive heart failure) (HCC)    Chronic combined systolic and diastolic heart failure (HCC) 03/05/2014   Closed low lateral malleolus fracture 10/23/2013   Constipation    Cystitis 10/21/2017   Depression    Depression with anxiety 01/20/2013   Diabetes mellitus without complication (HCC)    Diverticulosis    Dyspnea    comes and goes intermittently mostly with exertion    Dysrhythmia    Edema, lower extremity     Essential hypertension    Prev followed by [facility] Christofer [name]/ Cardiology    Fibroid    age 33   Gallstones    Generalized abdominal cramping    History of cardiomyopathy    Hypertension    IBS (irritable bowel syndrome)    ICD (implantable cardioverter-defibrillator), single, 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 CPAP intolerance >> smothering Good tolerance of auto device (optimal pressure 12-13 on download).  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   PVC (premature ventricular contraction) 06/23/2016   Seizures (HCC)    as a child (08/03/2016)   SOB (shortness of breath)    Termination of pregnancy    due  to cardiac risk   Vitamin B12 deficiency    Vitamin D  deficiency     Allergies  Allergen Reactions   Vancomycin  Other (See Comments)    did something to my kidneys, PROGRESSED TO KIDNEY FAILURE!!   Aspirin  Other (See Comments)    Wheezing, (Pt states that she just wheezes some when she takes aspirin  by itself but she can take aspirin  in a combination product).   Contrast Media [Iodinated Contrast Media] Other (See Comments)    Multiple CT contrast studies done over 2 weeks caused ARF   Ciprofloxacin Itching and Rash   Cyclobenzaprine  Other (See Comments)    Can not tolerate   Farxiga  [Dapagliflozin] Rash   Sulfa  Antibiotics Itching and Rash     Review of Systems:  No headache, visual changes, nausea, vomiting, diarrhea, constipation, dizziness, abdominal pain, skin rash, fevers, chills, night sweats, weight loss, swollen lymph nodes, body aches, joint swelling, chest pain, shortness of breath, mood changes. POSITIVE muscle aches  Objective  Blood pressure (!) 124/92, pulse 84, height 5' 5 (1.651 m), weight 215 lb (97.5 kg), SpO2 97%.   General: No apparent distress alert and oriented x3 mood and affect normal, dressed appropriately.  HEENT: Pupils equal, extraocular movements intact  Respiratory: [name] speak in full sentences and does not appear short of breath  Cardiovascular: No lower extremity edema, non tender, no erythema  Gait relatively normal but does have some lateral tracking of the patella noted.  This is on the left side.  Positive patellar grind test noted. MSK:  Back does have some lossnoted.  Patient does have some tightness noted more in the paraspinal musculature.  Little bit in the neck as well with some limited sidebending.  Osteopathic findings  C2 flexed rotated and side bent right C6 flexed rotated and side bent left T3 extended rotated and side bent right inhaled rib T9 extended rotated and side bent left L2 flexed rotated and side bent right L3 flexed rotated and side bent left Sacrum right on right     Assessment and Plan:  Cervical disc disorder with radiculopathy of cervical region Discussed which activities to do and which ones to avoid.  Increase activity slowly.  Discussed icing regimen and home exercises.  Patient has been extremely more active.  Do feel that this will be significantly helpful.  Increase activity slowly.  Follow-up again in 6 to 8 weeks otherwise.    Nonallopathic problems  Decision today to treat with OMT was based on Physical Exam  After verbal consent patient was treated with HVLA, ME, FPR techniques in cervical, rib, thoracic,  lumbar, and sacral  areas  Patient tolerated the procedure well with improvement in symptoms  Patient given exercises, stretches and lifestyle modifications  See medications in patient instructions if given  Patient will follow up in 4-8 weeks     The above documentation has been reviewed and is accurate and complete Jamice Carreno M Laurelai Lepp, DO         Note: This dictation was prepared with Dragon dictation along with smaller phrase technology. Any transcriptional errors that result from this process are unintentional.

## 2024-01-17 NOTE — Progress Notes (Signed)
 Office: [phone]  /  Fax: [phone]  Weight Summary and Body Composition Analysis (BIA)  Vitals Temp: 97.9 F (36.6 C) BP: 124/84 Pulse Rate: 77 SpO2: 99 %   Anthropometric Measurements Height: 5' 5 (1.651 m) Weight: 212 lb (96.2 kg) BMI (Calculated): 35.28 Weight at Last Visit: 211lb Weight Lost Since Last Visit: 0lb Weight Gained Since Last Visit: 1lb Starting Weight: 211lb Total Weight Loss (lbs): 0 lb (0 kg) Peak Weight: 328lb Waist Measurement : 40 inches   Body Composition  Body Fat %: 42.4 % Fat Mass (lbs): 90 lbs Muscle Mass (lbs): 116.2 lbs Total Body Water (lbs): 76.6 lbs Visceral Fat Rating : 11   The 10-year ASCVD risk score (Arnett DK, et al., 2019) is: 4.1%  RMR: 1570  Today's Visit #: 2  Starting Date: 01/03/24   Subjective   Chief Complaint: Obesity  Interval History [name] is a 48 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  She has been following a 1000 calorie meal plan for the past two weeks, facing challenges in adhering to the plan, particularly in ensuring adequate calorie and water intake. Despite these challenges, she does not skip meals and feels that the meal plan provides enough food.  She has a history of postpartum cardiomyopathy and is on a complex cardiac medication regimen, including Jardiance  and spironolactone . Her heart index was zero at her last ICD scan. She attends cardiac rehab three times a week and has increased her physical activity significantly, including swimming, treadmill exercises, and using a walking pad at home.  She had a history of prediabetes although her chart states diabetes.  She was previously on Saxenda  for obesity over a decade ago, which helped her lose weight, but she had to stop due to side effects and insurance changes. Her current A1c is 5.2.  She has a history of sleep apnea.  She has a history of  depression, which began around the age of 50-25. She has been on various medications over the years without significant improvement and is currently seeking a referral for a psychiatrist. She attributes some of her depression to life changes, including her health issues and the impact on her lifestyle and family interactions.   Challenges affecting patient progress: medical comorbidities.    Pharmacotherapy for weight management: She is currently taking no anti-obesity medication.   Assessment and Plan   Treatment Plan For Obesity:  Recommended Dietary Goals  [name] is currently in the action stage of change. As such, her goal is to continue weight management plan. She has agreed to: continue current plan  Behavioral Health and Counseling  We discussed the following behavioral modification strategies today: continue to work on maintaining a reduced calorie state, getting the recommended amount of protein, incorporating whole foods, making healthy choices, staying well hydrated and practicing mindfulness when eating. and increase protein intake, fibrous foods (25 grams per day for women, 30 grams for men) and water to improve satiety and decrease hunger signals. .  Additional education and resources provided today: None  Recommended Physical Activity Goals  [name] has been advised to work up to 150 minutes of moderate intensity aerobic activity a week and strengthening exercises 2-3 times per week for cardiovascular health, weight loss maintenance and preservation of muscle mass.  She has agreed to :  Think about enjoyable ways to increase daily physical activity and overcoming barriers to exercise, Increase physical activity in their day  and reduce sedentary time (increase NEAT)., Increase volume of physical activity to a goal of 240 minutes a week, and Combine aerobic and strengthening exercises for efficiency and improved cardiometabolic health.  Medical Interventions and  Pharmacotherapy  We discussed various medication options to help [name] with her weight loss efforts and we both agreed to : Continue with current nutritional and behavioral strategies  Associated Conditions Impacted by Obesity Treatment  Assessment & Plan B12 deficiency She will start a bariatric vitamin which 1000 mcg daily Vitamin D  deficiency Patient has low vitamin D  level despite taking high-dose supplementation.  She will be starting a bariatric vitamin.  She does not have any other symptoms of celiac disease and has not been evaluated for this in the past.  Her malabsorption could be due to gastric sleeve surgery but is uncommon. 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 Current weight management plan includes a 1000 calorie meal plan and increased physical activity. Body composition analysis shows an increase in muscle mass and a decrease in body fat percentage, indicating positive changes despite stable weight. Discussed the importance of maintaining a calorie intake below 1500 to continue weight loss. Emphasized the role of physical activity, including non-exercise activity and structured exercise, in weight management. Discussed the potential role of anti-obesity medications but noted good appetite control with current regimen. - Continue 1000 calorie meal plan. - Encourage physical activity, aiming for 240 minutes per week, including 150 minutes of aerobic exercise and 2-3 sessions of strength training. - Monitor weight and body composition changes. - Reassess in 3 weeks to evaluate progress and consider medication if necessary. Positive screening for depression on 9-item Patient Health Questionnaire (PHQ-9) Moderate to severe depression with a PHQ-9 score of 17. Depression may  be influenced by cardiac medications, life changes, and personal history. She is seeking a referral to a psychiatrist for further management. Discussed the potential impact of exercise on depression and the importance of finding the right treatment. - Follow up on referral to psychiatrist for depression management. - Consider counseling and potential medication adjustments. Essential hypertension Blood pressure  is at goal for age and risk category.  On metoprolol , spironolactone , loop diuretic, Jardiance  and Entresto  without adverse effects.  Most recent renal parameters reviewed which showed stable electrolytes and kidney function.  Continue with weight loss therapy. Losing 10% may improve blood pressure control. Monitor for symptoms of orthostasis while losing weight. Continue current regimen and home monitoring for a goal blood pressure of < 120/80.   Patient advised to reduce sodium in her diet we also have to monitor diuretics closely as she will be at risk for orthostasis as she starts to lose weight.  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 She is attending cardiac rehab and has increased physical activity, which is beneficial for heart function. Jardiance  is used for heart failure and has a diuretic effect, aiding in weight loss and heart function improvement. Discussed potential side effects such as bladder infections due to its glycosuria effect - Continue current cardiac medications. - Attend cardiac rehab sessions regularly. - Monitor for signs of fluid retention and adjust Torsen as needed. - Discuss potential medication adjustments with cardiologist as heart function improves. Iron  deficiency Patient has a low ferritin  level with normal serum iron .  I recommend she start a bariatric vitamin as she has several nutritional deficiencies.  Bariatric  vitamins have iron  in it also high-dose B12 and vitamin D .         Objective   Physical Exam:  Blood pressure 124/84, pulse 77, temperature 97.9 F (36.6 C), height 5' 5 (1.651 m), weight 212 lb (96.2 kg), SpO2 99%. Body mass index is 35.28 kg/m.  General: She is overweight, cooperative, alert, well developed, and in no acute distress. PSYCH: Has normal mood, affect and thought process.   HEENT: EOMI, sclerae are anicteric. Lungs: Normal breathing effort, no conversational dyspnea. Extremities: No edema.  Neurologic: No gross sensory or motor deficits. No tremors or fasciculations noted.    Diagnostic Data Reviewed:  BMET    Component Value Date/Time   NA 141 11/15/2023 1445   NA 138 07/23/2023 1322   K 4.3 11/15/2023 1445   CL 109 11/15/2023 1445   CO2 18 (L) 11/15/2023 1445   GLUCOSE 89 11/15/2023 1445   BUN 8 11/15/2023 1445   BUN 22 07/23/2023 1322   CREATININE 0.83 11/15/2023 1445   CREATININE 0.90 03/26/2016 1536   CALCIUM 8.9 11/15/2023 1445   GFRNONAA >60 11/15/2023 1445   GFRNONAA >89 01/18/2014 1621   GFRAA >60 02/15/2020 1845   GFRAA >89 01/18/2014 1621   Lab Results  Component Value Date   HGBA1C 5.2 07/16/2023   HGBA1C 5.4 11/09/2015   Lab Results  Component Value Date   INSULIN  11.8 01/03/2024   Lab Results  Component Value Date   TSH 1.750 07/23/2023   CBC    Component Value Date/Time   WBC 5.5 07/16/2023 1218   RBC 4.85 07/16/2023 1218   HGB 13.4 07/16/2023 1218   HGB 10.6 (L) 09/07/2019 1159   HCT 42.1 07/16/2023 1218   HCT 32.6 (L) 08/06/2020 1517   PLT 279 07/16/2023 1218   PLT 278 09/07/2019 1159   MCV 86.8 07/16/2023 1218   MCV 82 09/07/2019 1159   MCH 27.6 07/16/2023 1218   MCHC 31.8 07/16/2023 1218   RDW 15.0 07/16/2023 1218   RDW 14.7 09/07/2019 1159   Iron  Studies    Component Value Date/Time   IRON  61 07/16/2023 1218   TIBC 472 (H) 07/16/2023 1218   FERRITIN 11 07/16/2023 1218   IRONPCTSAT 13 07/16/2023 1218    Lipid Panel     Component Value Date/Time   CHOL 164 06/11/2023 1107   TRIG 66.0 06/11/2023 1107   HDL 67.00 06/11/2023 1107   CHOLHDL 2 06/11/2023 1107   VLDL 13.2 06/11/2023 1107   LDLCALC 83 06/11/2023 1107   Hepatic Function Panel     Component Value Date/Time   PROT 6.7 01/21/2023 1523   PROT 7.3 05/15/2021 1032   ALBUMIN  3.8 01/21/2023 1523   ALBUMIN  4.5 05/15/2021 1032   AST 38 01/21/2023 1523   ALT 30 01/21/2023 1523   ALKPHOS 64 01/21/2023 1523   BILITOT 1.1 01/21/2023 1523   BILITOT 0.3 05/15/2021 1032   BILIDIR <0.1 08/26/2020 0201   IBILI 0.6 11/21/2017 2155      Component Value Date/Time   TSH 1.750 07/23/2023 1321   Nutritional Lab Results  Component Value Date   VD25OH 24.1 (L) 01/03/2024   VD25OH 14.88 (L) 06/11/2023   VD25OH 12.60 (L) 07/29/2020    Medications: Outpatient Encounter Medications as of 01/17/2024  Medication Sig   acetaminophen  (TYLENOL ) 650 MG CR tablet Take 650 mg by mouth every  8 (eight) hours as needed for pain (headache).   albuterol  (VENTOLIN  HFA) 108 (90 Base) MCG/ACT inhaler Inhale 1-2 puffs into the lungs every 6 (six) hours as needed for wheezing or shortness of breath.   budesonide -formoterol  (SYMBICORT ) 160-4.5 MCG/ACT inhaler Inhale 2 puffs into the lungs 2 (two) times daily as needed.   diclofenac  (VOLTAREN ) 75 MG EC tablet Take 1 tablet by mouth twice daily as needed   dicyclomine  (BENTYL ) 20 MG tablet Take 1 tablet (20 mg total) by mouth daily as needed for spasms.   ENTRESTO  97-103 MG Take 1 tablet by mouth twice daily   fluticasone  (FLONASE ) 50 MCG/ACT nasal spray Place 2 sprays into both nostrils daily as needed for allergies.   gabapentin  (NEURONTIN ) 300 MG capsule Take 1 capsule (300 mg total) by mouth at bedtime as needed (back spasms).   ipratropium (ATROVENT ) 0.02 % nebulizer solution Inhale 500 mcg into the lungs.   ipratropium-albuterol  (DUONEB) 0.5-2.5 (3) MG/3ML SOLN USE 1 AMPULE IN NEBULIZER EVERY 6 HOURS  AS NEEDED   ivabradine  (CORLANOR ) 7.5 MG TABS tablet Take 1 tablet (7.5 mg total) by mouth 2 (two) times daily with a meal.   JARDIANCE  25 MG TABS tablet TAKE 1 TABLET BY MOUTH ONCE DAILY BEFORE BREAKFAST   Magnesium  Oxide 200 MG TABS Take 1 tablet (200 mg total) by mouth daily.   metolazone  (ZAROXOLYN ) 2.5 MG tablet TAKE 1 TABLET BY MOUTH AS DIRECTED   metoprolol  succinate (TOPROL -XL) 100 MG 24 hr tablet Take 1 tablet (100 mg total) by mouth daily. Take with or immediately following a meal.   Multiple Vitamins-Minerals (MULTIVITAMIN GUMMIES WOMENS PO) Take 2 tablets by mouth daily.   nitroGLYCERIN  (NITROSTAT ) 0.4 MG SL tablet Place 1 tablet (0.4 mg total) under the tongue every 5 (five) minutes as needed for chest pain.   potassium chloride  SA (KLOR-CON  M) 20 MEQ tablet Take 3 tablets (60 mEq total) by mouth 2 (two) times daily.   spironolactone  (ALDACTONE ) 25 MG tablet Take 1 tablet (25 mg total) by mouth at bedtime.   tizanidine  (ZANAFLEX ) 2 MG capsule Take 2 mg by mouth as needed for muscle spasms.   torsemide  (DEMADEX ) 20 MG tablet Take 40 mg by mouth 2 (two) times daily.   Ubrogepant  (UBRELVY ) 100 MG TABS Take 1 tablet (100 mg total) by mouth daily as needed (migraine).   Vitamin D , Ergocalciferol , (DRISDOL ) 1.25 MG (50000 UNIT) CAPS capsule Take 1 capsule by mouth once a week   digoxin  (LANOXIN ) 0.125 MG tablet Take 1 tablet (0.125 mg total) by mouth daily. (Patient not taking: Reported on 01/17/2024)   [DISCONTINUED] amiodarone  (PACERONE ) 200 MG tablet Take 1 tablet (200 mg total) by mouth daily. (Patient not taking: Reported on 01/03/2024)   [DISCONTINUED] mexiletine (MEXITIL ) 150 MG capsule Take 2 capsules (300 mg total) by mouth 2 (two) times daily. (Patient not taking: Reported on 01/03/2024)   [DISCONTINUED] Multiple Vitamin (MULTI-VITAMIN) tablet Take 1 tablet by mouth daily. (Patient not taking: Reported on 01/03/2024)   No facility-administered encounter medications on file as of  01/17/2024.     Follow-Up   Return in about 3 weeks (around 02/07/2024) for For Weight Mangement with Dr. [email or web address].[email or web address] She was informed of the importance of frequent follow up visits to maximize her success with intensive lifestyle modifications for her multiple health conditions.  Attestation Statement   Reviewed by clinician on day of visit: allergies, medications, problem list, medical history, surgical history, family history, social history, and previous encounter notes.  I have spent 52 minutes in the care of the patient today including: 3 minutes before the visit reviewing and preparing the chart. 41 minutes face-to-face assessing and reviewing listed medical problems as outlined in obesity care plan, providing nutritional and behavioral counseling on topics outlined in the obesity care plan, counseling regarding anti-obesity medication as outlined in obesity care plan, independently interpreting test results and goals of care, as described in assessment and plan, and reviewing and discussing biometric information and progress 8 minutes after the visit updating chart and documentation of encounter.    Lucas Parker, MD

## 2024-01-17 NOTE — Assessment & Plan Note (Signed)
 She will start a bariatric vitamin which 1000 mcg daily

## 2024-01-18 ENCOUNTER — Ambulatory Visit (INDEPENDENT_AMBULATORY_CARE_PROVIDER_SITE_OTHER): Admitting: Family Medicine

## 2024-01-18 ENCOUNTER — Encounter: Payer: Self-pay | Admitting: Family Medicine

## 2024-01-18 ENCOUNTER — Ambulatory Visit (INDEPENDENT_AMBULATORY_CARE_PROVIDER_SITE_OTHER)

## 2024-01-18 ENCOUNTER — Other Ambulatory Visit: Payer: Self-pay | Admitting: Family Medicine

## 2024-01-18 VITALS — BP 130/92 | HR 93 | Temp 98.0°F | Ht 65.0 in | Wt 215.2 lb

## 2024-01-18 VITALS — BP 124/92 | HR 84 | Ht 65.0 in | Wt 215.0 lb

## 2024-01-18 DIAGNOSIS — B9689 Other specified bacterial agents as the cause of diseases classified elsewhere: Secondary | ICD-10-CM | POA: Diagnosis not present

## 2024-01-18 DIAGNOSIS — I5042 Chronic combined systolic (congestive) and diastolic (congestive) heart failure: Secondary | ICD-10-CM

## 2024-01-18 DIAGNOSIS — J329 Chronic sinusitis, unspecified: Secondary | ICD-10-CM | POA: Diagnosis not present

## 2024-01-18 DIAGNOSIS — M25562 Pain in left knee: Secondary | ICD-10-CM

## 2024-01-18 DIAGNOSIS — M9904 Segmental and somatic dysfunction of sacral region: Secondary | ICD-10-CM | POA: Diagnosis not present

## 2024-01-18 DIAGNOSIS — M222X2 Patellofemoral disorders, left knee: Secondary | ICD-10-CM | POA: Diagnosis not present

## 2024-01-18 DIAGNOSIS — M9901 Segmental and somatic dysfunction of cervical region: Secondary | ICD-10-CM

## 2024-01-18 DIAGNOSIS — R051 Acute cough: Secondary | ICD-10-CM | POA: Diagnosis not present

## 2024-01-18 DIAGNOSIS — M1712 Unilateral primary osteoarthritis, left knee: Secondary | ICD-10-CM | POA: Diagnosis not present

## 2024-01-18 DIAGNOSIS — M9908 Segmental and somatic dysfunction of rib cage: Secondary | ICD-10-CM | POA: Diagnosis not present

## 2024-01-18 DIAGNOSIS — M9902 Segmental and somatic dysfunction of thoracic region: Secondary | ICD-10-CM

## 2024-01-18 DIAGNOSIS — M5010 Cervical disc disorder with radiculopathy, unspecified cervical region: Secondary | ICD-10-CM | POA: Diagnosis not present

## 2024-01-18 DIAGNOSIS — M222X1 Patellofemoral disorders, right knee: Secondary | ICD-10-CM

## 2024-01-18 DIAGNOSIS — M9903 Segmental and somatic dysfunction of lumbar region: Secondary | ICD-10-CM | POA: Diagnosis not present

## 2024-01-18 DIAGNOSIS — M25462 Effusion, left knee: Secondary | ICD-10-CM | POA: Diagnosis not present

## 2024-01-18 MED ORDER — AMOXICILLIN-POT CLAVULANATE 875-125 MG PO TABS: 1.0000 | ORAL_TABLET | Freq: Two times a day (BID) | ORAL | 0 refills | Status: DC

## 2024-01-18 MED ORDER — METHYLPREDNISOLONE ACETATE 40 MG/ML IJ SUSP
40.0000 mg | Freq: Once | INTRAMUSCULAR | Status: AC
  Administered 2024-01-18: 40 mg via INTRAMUSCULAR

## 2024-01-18 MED ORDER — HYDROCODONE BIT-HOMATROP MBR 5-1.5 MG/5ML PO SOLN: 5.0000 mL | Freq: Three times a day (TID) | ORAL | 0 refills | Status: DC | PRN

## 2024-01-18 NOTE — Patient Instructions (Addendum)
 Xray today L tru pull lite Exercises See me again in 2-3 months

## 2024-01-18 NOTE — Assessment & Plan Note (Signed)
 Discussed which activities to do and which ones to avoid.  Increase activity slowly.  Discussed icing regimen and home exercises.  Patient has been extremely more active.  Do feel that this will be significantly helpful.  Increase activity slowly.  Follow-up again in 6 to 8 weeks otherwise.

## 2024-01-18 NOTE — Patient Instructions (Signed)
 I have sent in Augmentin  for you to take twice a day for 10 days.  This medication can upset your stomach, so I tell everyone to take it with a meal.  I have sent in hydrocodone  cough syrup for you to take 5 mL once daily in the evening as needed for cough.  This medication may make you sleepy.  Do not drive or operate heavy machinery while taking this medication.  You have received a steroid injection in the office today.  Continue current medication regimen.  Follow up with me on Friday if symptoms are persisting.

## 2024-01-18 NOTE — Assessment & Plan Note (Signed)
 Reviewed anatomy using anatomical model and how PFS occurs.  Given rehab exercises handout for VMO, hip abductors, core, entire kinetic chain including proprioception exercises.  Could benefit from PT, regular exercise, upright biking, and a PFS knee brace to assist with tracking abnormalities. Worsening pain consider injection

## 2024-01-18 NOTE — Progress Notes (Signed)
 Acute Office Visit  Subjective:     Patient ID: DEVONDA PEQUIGNOT, female    DOB: 26-Feb-1976, 48 y.o.   MRN: [id number]  Chief Complaint  Patient presents with   Cough    Ongoing for 9 days, nasal congestion, wet cough, wheezing. Has tried robitussin dm, alka seltzer, and theraflu no relief. Chest tightness after cough fits    Cough    Discussed the use of AI scribe software for clinical note transcription with the patient, who gave verbal consent to proceed.  History of Present Illness TRUE Shackleford is a 48 year old female with congestive heart failure who presents with sinus congestion and cough.  Upper respiratory symptoms - Sinus congestion and persistent cough for the last 9 days - Ears feel clogged but eventually pop open - No fever - Mild shortness of breath - Over-the-counter medications used: children's Robitussin DM, Alka-Seltzer Cold, and Theraflu - Alka-Seltzer Cold provides the most relief  Congestive heart failure management - Congestive heart failure managed with torsemide , spironolactone , and metolazone  - Increased torsemide  did not resolve cough and resulted in dehydration - No longer taking digoxin  - Attends cardiac rehab three times per week  Medication allergies and tolerances - Allergic to Cipro and sulfa antibiotics - Tolerates penicillins and Augmentin      Review of Systems  Respiratory:  Positive for cough.    Per HPI      Objective:    BP (!) 130/92 (BP Location: Left Arm, Patient Position: Sitting)   Pulse 93   Temp 98 F (36.7 C) (Temporal)   Ht 5' 5 (1.651 m)   Wt 215 lb 3.2 oz (97.6 kg)   SpO2 96%   BMI 35.81 kg/m    Physical Exam Vitals and nursing note reviewed.  Constitutional:      General: She is not in acute distress.    Comments: Appears fatigued  HENT:     Head: Normocephalic and atraumatic.     Right Ear: Tympanic membrane, ear canal and external ear normal.     Left Ear: Tympanic membrane, ear canal  and external ear normal.     Nose: Congestion present.     Mouth/Throat:     Mouth: Mucous membranes are moist.     Comments: Oropharyngeal cobblestoning   Eyes:     Extraocular Movements: Extraocular movements intact.     Pupils: Pupils are equal, round, and reactive to light.  Cardiovascular:     Rate and Rhythm: Normal rate and regular rhythm.     Pulses: Normal pulses.     Heart sounds: Normal heart sounds.  Pulmonary:     Effort: Pulmonary effort is normal. No respiratory distress.     Breath sounds: Wheezing present. No rhonchi or rales.     Comments: Productive cough Musculoskeletal:        General: Normal range of motion.     Cervical back: Normal range of motion.     Right lower leg: No edema.     Left lower leg: No edema.  Lymphadenopathy:     Cervical: Cervical adenopathy present.  Neurological:     General: No focal deficit present.     Mental Status: She is alert and oriented to person, place, and time.  Psychiatric:        Mood and Affect: Mood normal.        Thought Content: Thought content normal.     No results found for any visits on 01/18/24.  Assessment & Plan:   Assessment and Plan Assessment & Plan Acute sinusitis Symptoms consistent with acute sinusitis and suspected respiratory infection. Antibiotics chosen due to bacterial infection suspicion. Steroid injection selected to reduce inflammation considering heart failure. - Prescribe Augmentin  for 10 days. - Administer steroid injection. - Prescribe cough syrup with codeine . - Advise to avoid cardiac rehab for 24 hours. - Instruct to return if symptoms do not improve by Friday.  Congestive heart failure Managed with torsemide , spironolactone , and metolazone . No fluid overload; diuretics caused dehydration. - Continue current management      No orders of the defined types were placed in this encounter.    Meds ordered this encounter  Medications   amoxicillin -clavulanate  (AUGMENTIN ) 875-125 MG tablet    Sig: Take 1 tablet by mouth 2 (two) times daily.    Dispense:  20 tablet    Refill:  0   HYDROcodone  bit-homatropine (HYCODAN) 5-1.5 MG/5ML syrup    Sig: Take 5 mLs by mouth every 8 (eight) hours as needed for cough.    Dispense:  120 mL    Refill:  0   methylPREDNISolone  acetate (DEPO-MEDROL ) injection 40 mg    Return if symptoms worsen or fail to improve.  Corean LITTIE Ku, FNP

## 2024-01-19 ENCOUNTER — Encounter (HOSPITAL_COMMUNITY)
Admission: RE | Admit: 2024-01-19 | Discharge: 2024-01-19 | Disposition: A | Discharge status: Home / Self Care | Source: Ambulatory Visit | Attending: Internal Medicine

## 2024-01-19 ENCOUNTER — Encounter (HOSPITAL_COMMUNITY): Payer: Self-pay | Admitting: Internal Medicine

## 2024-01-19 DIAGNOSIS — I5042 Chronic combined systolic (congestive) and diastolic (congestive) heart failure: Secondary | ICD-10-CM

## 2024-01-19 NOTE — Telephone Encounter (Signed)
 Ok to send new script?  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

## 2024-01-20 MED ORDER — METOPROLOL SUCCINATE ER 50 MG PO TB24: 50.0000 mg | ORAL_TABLET | Freq: Every day | ORAL | 3 refills | Status: DC

## 2024-01-21 ENCOUNTER — Encounter (HOSPITAL_COMMUNITY)
Admission: RE | Admit: 2024-01-21 | Discharge: 2024-01-21 | Disposition: A | Discharge status: Home / Self Care | Source: Ambulatory Visit | Attending: Internal Medicine | Admitting: Internal Medicine

## 2024-01-21 DIAGNOSIS — I5042 Chronic combined systolic (congestive) and diastolic (congestive) heart failure: Secondary | ICD-10-CM | POA: Diagnosis not present

## 2024-01-24 DIAGNOSIS — I493 Ventricular premature depolarization: Secondary | ICD-10-CM | POA: Diagnosis not present

## 2024-01-25 ENCOUNTER — Encounter: Admitting: Student

## 2024-01-25 ENCOUNTER — Ambulatory Visit: Attending: Student | Admitting: Student

## 2024-01-25 ENCOUNTER — Encounter: Payer: Self-pay | Admitting: Student

## 2024-01-25 VITALS — BP 118/70 | HR 104 | Ht 65.0 in | Wt 210.0 lb

## 2024-01-25 DIAGNOSIS — I493 Ventricular premature depolarization: Secondary | ICD-10-CM | POA: Diagnosis not present

## 2024-01-25 DIAGNOSIS — Z95810 Presence of automatic (implantable) cardiac defibrillator: Secondary | ICD-10-CM

## 2024-01-25 DIAGNOSIS — I428 Other cardiomyopathies: Secondary | ICD-10-CM | POA: Diagnosis not present

## 2024-01-25 DIAGNOSIS — I5042 Chronic combined systolic (congestive) and diastolic (congestive) heart failure: Secondary | ICD-10-CM | POA: Diagnosis not present

## 2024-01-25 DIAGNOSIS — G4733 Obstructive sleep apnea (adult) (pediatric): Secondary | ICD-10-CM

## 2024-01-25 NOTE — Patient Instructions (Signed)
 Medication Instructions:  Your physician recommends that you continue on your current medications as directed. Please refer to the Current Medication list given to you today.   *If you need a refill on your cardiac medications before your next appointment, please call your pharmacy*  Lab Work: None ordered  If you have labs (blood work) drawn today and your tests are completely normal, you will receive your results only by: MyChart Message (if you have MyChart) OR A paper copy in the mail If you have any lab test that is abnormal or we need to change your treatment, we will call you to review the results.  Testing/Procedures: None ordered  Follow-Up: At [facility], you and your health needs are our priority.  As part of our continuing mission to provide you with exceptional heart care, our providers are all part of one team.  This team includes your primary Cardiologist (physician) and Advanced Practice Providers or APPs (Physician Assistants and Nurse Practitioners) who all work together to provide you with the care you need, when you need it.  Your next appointment:   6 month(s)  Provider:   Ozell Jodie Passey, PA-C    We recommend signing up for the patient portal called MyChart.  Sign up information is provided on this After Visit Summary.  MyChart is used to connect with patients for Virtual Visits (Telemedicine).  Patients are able to view lab/test results, encounter notes, upcoming appointments, etc.  Non-urgent messages can be sent to your provider as well.   To learn more about what you can do with MyChart, go to [email or web address].   Other Instructions

## 2024-01-25 NOTE — Progress Notes (Signed)
  Cardiology Office Note:   Date:  01/25/2024  ID:  [name], DOB 1976-02-05, MRN [id number]  Primary Cardiologist: [name], MD (Inactive) Electrophysiologist: Danelle Birmingham, MD   History of Present Illness:   [name] is a 48 y.o. female with h/o Chronic systolic CHf, very frequent PVCs, s/p ICD, s/p Barostim seen today for routine electrophysiology followup.   At last barostim visit, Device titrated from 11.0 millamp to 9.0 milliamp with improvement of her neck fullness/globus sensation.   Since last being seen in our clinic the patient reports doing OK. She feels tapped out some days, and contributes it to possible dehydration. Otherwise, she denies chest pain, palpitations, dyspnea, PND, orthopnea, nausea, vomiting, dizziness, syncope, edema, weight gain, or early satiety.   Wore monitor post re-do PVC ablation and burden down from 25% to ~7%.  Symptomatically much improved.   Review of systems complete and found to be negative unless listed in HPI.    EP Information / Studies Reviewed:    EKG is not ordered today. 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 Can leave detailed messages on [phone]. Allowed to speak to patients spouse Beryl and daughter Melodye. See signed designated party form for more details.   Barostim Interrogation- Performed personally and reviewed in detail today,  See scanned report  ICD/PPM interrogation - Not performed today. See last [employer] report    Physical Exam:   VS:  There were no vitals taken for this visit.   Wt Readings from Last 3 Encounters:  01/18/24 215 lb 3.2 oz (97.6 kg)  01/18/24 215 lb (97.5 kg)  01/17/24 212 lb (96.2 kg)     GEN: Well nourished, well developed in no acute distress NECK: No JVD; No carotid bruits CARDIAC:  Regular rate and rhythm, no murmurs, rubs, gallops RESPIRATORY:  Clear to auscultation without rales, wheezing or rhonchi  ABDOMEN: Soft, non-tender, non-distended EXTREMITIES:  No edema; No deformity   ASSESSMENT AND PLAN:    Chronic systolic CHF s/p Chief Financial Officer and Barostim implantation NYHA II symptoms.   Device programmed at 9.0 for chronic settings  Device impedence stable. Pt goals are to maintain functional status. Worked out for 1hr and 15 minutes this am  Normal Barostim function ICD not checked in person today with up-to date remotes and stable in person check <12 months ago.  See scanned report.  PVCs Seems to be doing better after second ablation  Disposition:   Follow up with EP Team in in 6 months  Signed, Ozell Prentice Passey, PA-C

## 2024-01-26 ENCOUNTER — Telehealth (HOSPITAL_COMMUNITY): Payer: Self-pay | Admitting: Cardiology

## 2024-01-26 ENCOUNTER — Telehealth: Payer: Self-pay | Admitting: Student

## 2024-01-26 ENCOUNTER — Telehealth: Payer: Self-pay

## 2024-01-26 ENCOUNTER — Telehealth (HOSPITAL_COMMUNITY): Payer: Self-pay

## 2024-01-26 ENCOUNTER — Encounter (HOSPITAL_COMMUNITY)
Admission: RE | Admit: 2024-01-26 | Discharge: 2024-01-26 | Disposition: A | Discharge status: Home / Self Care | Source: Ambulatory Visit | Attending: Internal Medicine | Admitting: Internal Medicine

## 2024-01-26 ENCOUNTER — Ambulatory Visit: Admitting: Family Medicine

## 2024-01-26 DIAGNOSIS — Z95810 Presence of automatic (implantable) cardiac defibrillator: Secondary | ICD-10-CM | POA: Insufficient documentation

## 2024-01-26 DIAGNOSIS — I5042 Chronic combined systolic (congestive) and diastolic (congestive) heart failure: Secondary | ICD-10-CM | POA: Insufficient documentation

## 2024-01-26 MED ORDER — SACUBITRIL-VALSARTAN 49-51 MG PO TABS: 1.0000 | ORAL_TABLET | Freq: Two times a day (BID) | ORAL | 6 refills | Status: DC

## 2024-01-26 NOTE — Telephone Encounter (Signed)
 Unable to complete cardiac rehab due to dizziness Pt reports extreme weakness after taking meds. During rehab warm up flet as if she was going into hole.  B/p at start 100/68 B/p at end 100/80 HR unknown Blood sugar normal Pt reports to eating breakfast and lunch AM meds taken at 0830 Weight down this week 208 last week 212  Denies swelling SOB CP  Unable to send device transmission as pt is currently in lobby of HF clinic

## 2024-01-26 NOTE — Telephone Encounter (Signed)
 Per Harlene Gainer, Np Decrease entresto  to 49/51 BID Send device transmission Bmet/bnp in 7-10 days Keep follow up as scheduled Monitor b/p, notify office if SBP less than 90 Adequately hydrate, be mindful of swift positional changes etc    Pt aware of above and voiced understanding

## 2024-01-26 NOTE — Telephone Encounter (Signed)
 Triage walk in form completed by patient as follows: Days of low b/p. Cardiac rehab dizziness. Normal blood blood sugar. Medications seem too strong now. Noticing a draining felling after taking complete course of morning meds since July 11th ablation and since workout regime began.    Returned call for additional information-LMOM

## 2024-01-26 NOTE — Telephone Encounter (Signed)
 Reason for walk-in: Walk-in Reasons: having symptoms (such as chest pain, palpitations, etc.) Sent by Cardiac Rehab & in communication with a physician here (at [facility] to be seen & discuss med changes.  If patient is requesting to be seen today, or if patient is having symptoms:  What symptoms are being reported (if any)?  Dizziness & fatigue  Route to triage pool and ensure Teams message has been sent to the Triage Walk-In chat.  3.   For medication samples, medication refills, HIM requests, appointment requests, lab-related requests, or form/record drop-off, please route to the appropriate pool.

## 2024-01-26 NOTE — Telephone Encounter (Signed)
 Returned call to patient after reviewing manual transmission sent by patient. No arrhythmias noted at this time and thoracic impedance is trending upward. Patient was able to contact HF team who previously halved Entresto . Patient states they wrote a new prescription of Entresto  today and will pick it up.   Patient advised to contact device clinic for any questions or concerns and if any new symptoms arise. Will continue to monitor and update accordingly. Patient appreciative of call.

## 2024-01-26 NOTE — Telephone Encounter (Signed)
 Spoke with patient over the phone regarding symptoms associated w/ dizziness/fatigue. Advised patient to go home and send a manual transmission per Prentice Passey, PA, to assess Optivol & for any arrhythmias that may be causing symptoms. Will need to F/U w/ HF team for long-term instructions regarding medications.   Patient verbalized understanding and plans to send manual transmission after arriving home. Will review transmission once arrived and update accordingly.

## 2024-01-26 NOTE — Telephone Encounter (Signed)
  Update from today's incomplete session note.  Prentice Passey, PA messaged me stating the following: I spoke with [name] with HF clinic. Med adjustments have been made and we are all fine with her continuing cardiac rehab.  No arrhythmias, just a little dry.  I called pt and informed her of the above.

## 2024-01-26 NOTE — Progress Notes (Addendum)
 Incomplete Session Note  Patient Details  Name: [name] MRN: [id number] Date of Birth: [birth year] Referring Provider:   Flowsheet Row INTENSIVE CARDIAC REHAB ORIENT from 01/03/2024 in [facility]  Referring Provider [name], MD    Jon [name] did not complete her rehab session.  Pt began feeling dizzy and weak as soon as she started warming up for exercise at cardiac rehab today (01/26/24). BP 104/60, BGL 102. Pt denies CP, endorses dry cough. No palpitations, hydrating yesterday and today.  Doesn't think her meds are quite where they ought to be and has informed me that when she was prescribed her BP meds, she was regularly staying up until 0300-0400 and sleeping until somewhere between 1100-1300. When her ablation happened she changed her sleeping habits and now goes to sleep at 0900.  States she has been to CR twice before now, once r/t cardiomyopathy and once r/t PVCs. She's felt like this several times since her July 2025 ablation, but yesterday after exercising at the pool has been the worst I've felt. Today has been slightly better.   0.3kg increase since last visit.  Onsite provider notified, who recommended EP clearing.  Contacted [name] with EP who stated EP will not change HF meds, that pt needs to f/u with HF clinic if symptoms persist, and that [name] has reached out to device clinic to send a manual transmission to r/o arrythmia.  Pt did not exercise today and needs to be cleared by HF in order to return to CR. Called pt and informed her she needs to f/u with HF clinic if symptoms persist.

## 2024-01-27 ENCOUNTER — Encounter (INDEPENDENT_AMBULATORY_CARE_PROVIDER_SITE_OTHER): Payer: Self-pay | Admitting: Internal Medicine

## 2024-01-27 LAB — GLUCOSE, CAPILLARY: Glucose-Capillary: 103 mg/dL — ABNORMAL HIGH (ref 70–99)

## 2024-01-28 ENCOUNTER — Encounter (HOSPITAL_COMMUNITY)
Admission: RE | Admit: 2024-01-28 | Discharge: 2024-01-28 | Disposition: A | Discharge status: Home / Self Care | Source: Ambulatory Visit | Attending: Internal Medicine | Admitting: Internal Medicine

## 2024-01-28 DIAGNOSIS — I5042 Chronic combined systolic (congestive) and diastolic (congestive) heart failure: Secondary | ICD-10-CM

## 2024-01-28 DIAGNOSIS — Z95810 Presence of automatic (implantable) cardiac defibrillator: Secondary | ICD-10-CM | POA: Diagnosis not present

## 2024-01-29 ENCOUNTER — Other Ambulatory Visit: Payer: Self-pay | Admitting: Family Medicine

## 2024-01-31 ENCOUNTER — Ambulatory Visit (HOSPITAL_COMMUNITY): Payer: Self-pay | Admitting: Family Medicine

## 2024-01-31 ENCOUNTER — Ambulatory Visit (HOSPITAL_COMMUNITY)
Admission: RE | Admit: 2024-01-31 | Discharge: 2024-01-31 | Disposition: A | Discharge status: Home / Self Care | Source: Ambulatory Visit | Attending: Cardiology | Admitting: Cardiology

## 2024-01-31 ENCOUNTER — Ambulatory Visit (INDEPENDENT_AMBULATORY_CARE_PROVIDER_SITE_OTHER)

## 2024-01-31 ENCOUNTER — Other Ambulatory Visit (HOSPITAL_COMMUNITY)

## 2024-01-31 ENCOUNTER — Encounter (HOSPITAL_COMMUNITY)
Admission: RE | Admit: 2024-01-31 | Discharge: 2024-01-31 | Disposition: A | Discharge status: Home / Self Care | Source: Ambulatory Visit | Attending: Internal Medicine | Admitting: Internal Medicine

## 2024-01-31 DIAGNOSIS — I5042 Chronic combined systolic (congestive) and diastolic (congestive) heart failure: Secondary | ICD-10-CM | POA: Insufficient documentation

## 2024-01-31 DIAGNOSIS — Z95810 Presence of automatic (implantable) cardiac defibrillator: Secondary | ICD-10-CM

## 2024-01-31 LAB — BASIC METABOLIC PANEL WITH GFR
Anion gap: 10 (ref 5–15)
BUN: 15 mg/dL (ref 6–20)
CO2: 25 mmol/L (ref 22–32)
Calcium: 9.3 mg/dL (ref 8.9–10.3)
Chloride: 102 mmol/L (ref 98–111)
Creatinine, Ser: 0.85 mg/dL (ref 0.44–1.00)
GFR, Estimated: >60 mL/min (ref >60)
Glucose, Bld: 105 mg/dL — ABNORMAL HIGH (ref 70–99)
Potassium: 4.2 mmol/L (ref 3.5–5.1)
Sodium: 137 mmol/L (ref 135–145)

## 2024-01-31 LAB — BRAIN NATRIURETIC PEPTIDE: B Natriuretic Peptide: 38.1 pg/mL (ref 0.0–100.0)

## 2024-02-01 NOTE — Progress Notes (Signed)
 Cardiac Individual Treatment Plan  Patient Details  Name: [name] MRN: [id number] Date of Birth: 24-Jan-1976 Referring Provider:   Flowsheet Row INTENSIVE CARDIAC REHAB ORIENT from 01/03/2024 in [facility]  Referring Provider [name], MD    Initial Encounter Date:  Flowsheet Row INTENSIVE CARDIAC REHAB ORIENT from 01/03/2024 in [facility]  Date 01/03/24    Visit Diagnosis: Heart failure, systolic and diastolic, chronic (HCC)  [name] Home Medications on Admission:  Current Outpatient Medications:    acetaminophen  (TYLENOL ) 650 MG CR tablet, Take 650 mg by mouth every 8 (eight) hours as needed for pain (headache)., Disp: , Rfl:    albuterol  (VENTOLIN  HFA) 108 (90 Base) MCG/ACT inhaler, Inhale 1-2 puffs into the lungs every 6 (six) hours as needed for wheezing or shortness of breath., Disp: 18 g, Rfl: 0   budesonide -formoterol  (SYMBICORT ) 160-4.5 MCG/ACT inhaler, Inhale 2 puffs into the lungs 2 (two) times daily as needed., Disp: 10 g, Rfl: 3   diclofenac  (VOLTAREN ) 75 MG EC tablet, Take 1 tablet by mouth twice daily as needed, Disp: 60 tablet, Rfl: 0   dicyclomine  (BENTYL ) 20 MG tablet, Take 1 tablet (20 mg total) by mouth daily as needed for spasms., Disp: 90 tablet, Rfl: 3   fluticasone  (FLONASE ) 50 MCG/ACT nasal spray, Place 2 sprays into both nostrils daily as needed for allergies., Disp: 16 g, Rfl: 3   gabapentin  (NEURONTIN ) 300 MG capsule, Take 1 capsule (300 mg total) by mouth at bedtime as needed (back spasms)., Disp: 90 capsule, Rfl: 3   ipratropium (ATROVENT ) 0.02 % nebulizer solution, Inhale 500 mcg into the lungs., Disp: , Rfl:    ipratropium-albuterol  (DUONEB) 0.5-2.5 (3) MG/3ML SOLN, USE 1 AMPULE IN NEBULIZER EVERY 6 HOURS AS NEEDED, Disp: 360 mL, Rfl: 0   ivabradine  (CORLANOR ) 7.5 MG TABS tablet, Take 1 tablet (7.5 mg total) by mouth 2 (two)  times daily with a meal., Disp: 60 tablet, Rfl: 11   JARDIANCE  25 MG TABS tablet, TAKE 1 TABLET BY MOUTH ONCE DAILY BEFORE BREAKFAST, Disp: 90 tablet, Rfl: 0   Magnesium  Oxide 200 MG TABS, Take 1 tablet (200 mg total) by mouth daily., Disp: 90 tablet, Rfl: 3   metolazone  (ZAROXOLYN ) 2.5 MG tablet, TAKE 1 TABLET BY MOUTH AS DIRECTED, Disp: 5 tablet, Rfl: 0   metoprolol  succinate (TOPROL -XL) 50 MG 24 hr tablet, Take 1 tablet (50 mg total) by mouth daily. Take with or immediately following a meal., Disp: 90 tablet, Rfl: 3   nitroGLYCERIN  (NITROSTAT ) 0.4 MG SL tablet, Place 1 tablet (0.4 mg total) under the tongue every 5 (five) minutes as needed for chest pain., Disp: 75 tablet, Rfl: 0   OVER THE COUNTER MEDICATION, Take 1 capsule by mouth daily. BARIATRIC VITAMIN, Disp: , Rfl:    potassium chloride  SA (KLOR-CON  M) 20 MEQ tablet, Take 3 tablets (60 mEq total) by mouth 2 (two) times daily., Disp: 180 tablet, Rfl: 6   sacubitril -valsartan  (ENTRESTO ) 49-51 MG, Take 1 tablet by mouth 2 (two) times daily., Disp: 60 tablet, Rfl: 6   spironolactone  (ALDACTONE ) 25 MG tablet, Take 1 tablet (25 mg total) by mouth at bedtime., Disp: 90 tablet, Rfl: 3   tizanidine  (ZANAFLEX ) 2 MG capsule, Take 2 mg by mouth as needed for muscle spasms., Disp: , Rfl:    torsemide  (DEMADEX ) 20 MG tablet, Take 40 mg by mouth 2 (two) times daily., Disp: ,  Rfl:    Ubrogepant  (UBRELVY ) 100 MG TABS, Take 1 tablet (100 mg total) by mouth daily as needed (migraine)., Disp: 30 tablet, Rfl: 3   Vitamin D , Ergocalciferol , (DRISDOL ) 1.25 MG (50000 UNIT) CAPS capsule, Take 1 capsule by mouth once a week, Disp: 12 capsule, Rfl: 0  Past Medical History: Past Medical History:  Diagnosis Date   Acute on chronic systolic (congestive) heart failure (HCC) 04/29/2017   Acute pain of right shoulder 06/16/2017   ADHD    AICD (automatic cardioverter/defibrillator) present    Anginal pain (HCC)    Anxiety    Arthritis    right shoulder    Asthma     Back pain    CHF (congestive heart failure) (HCC)    Chronic combined systolic and diastolic heart failure (HCC) 03/05/2014   Closed low lateral malleolus fracture 10/23/2013   Constipation    Cystitis 10/21/2017   Depression    Depression with anxiety 01/20/2013   Diabetes mellitus without complication (HCC)    Diverticulosis    Dyspnea    comes and goes intermittently mostly with exertion    Dysrhythmia    Edema, lower extremity    Essential hypertension    Prev followed by [name]    Fibroid    age 48   Gallstones    Generalized abdominal cramping    History of cardiomyopathy    Hypertension    IBS (irritable bowel syndrome)    ICD (implantable cardioverter-defibrillator), single, 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 CPAP intolerance >> smothering Good tolerance of auto device (optimal pressure 12-13 on download).  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   PVC (premature ventricular contraction) 06/23/2016   Seizures (HCC)    as a child (08/03/2016)   SOB (shortness of breath)    Termination of pregnancy    due to cardiac risk   Vitamin B12 deficiency    Vitamin D  deficiency     Tobacco Use: Social History   Tobacco Use  Smoking Status Never   Passive exposure: Never  Smokeless  Tobacco Never    Labs: Review Flowsheet  More data exists      Latest Ref Rng & Units 08/26/2020 12/13/2021 12/14/2021 06/11/2023 07/16/2023  Labs for ITP Cardiac and Pulmonary Rehab  Cholestrol 0 - 200 mg/dL - - - 835  -  LDL (calc) 0 - 99 mg/dL - - - 83  -  HDL-C >60.99 mg/dL - - - 32.99  -  Trlycerides 0.0 - 149.0 mg/dL - 84  - 33.9  -  Hemoglobin A1c 4.8 - 5.6 % 5.5  - - 5.5  5.2   Bicarbonate 20.0 - 28.0 mmol/L - - 24.7  - -  TCO2 22 - 32 mmol/L - -  26  - -  O2 Saturation % - - 100  - -     Exercise Target Goals: Exercise Program Goal: Individual exercise prescription set using results from initial 6 min walk test and THRR while considering  patient's activity barriers and safety.   Exercise Prescription Goal: Initial exercise prescription builds to 30-45 minutes a day of aerobic activity, 2-3 days per week.  Home exercise guidelines will be given to patient during program as part of exercise prescription that the participant will acknowledge.   Education: Aerobic Exercise: - Group verbal and visual presentation on the components of exercise prescription. Introduces F.I.T.T principle from ACSM for exercise prescriptions.  Reviews F.I.T.T. principles of aerobic exercise including progression. Written material provided at class time.   Education: Resistance Exercise: - Group verbal and visual presentation on the components of exercise prescription. Introduces F.I.T.T principle from ACSM for exercise prescriptions  Reviews F.I.T.T. principles of resistance exercise including progression. Written material provided at class time.    Education: Exercise & Equipment Safety: - Individual verbal instruction and demonstration of equipment use and safety with use of the equipment.   Education: Exercise Physiology & General Exercise Guidelines: - Group verbal and written instruction with models to review the exercise physiology of the cardiovascular system and associated critical values.  Provides general exercise guidelines with specific guidelines to those with heart or lung disease. Written material provided at class time.   Education: Flexibility, Balance, Mind/Body Relaxation: - Group verbal and visual presentation with interactive activity on the components of exercise prescription. Introduces F.I.T.T principle from ACSM for exercise prescriptions. Reviews F.I.T.T. principles of flexibility and balance exercise training including progression. Also discusses the mind body connection.  Reviews various relaxation techniques to help reduce and manage stress (i.e. Deep breathing, progressive muscle relaxation, and visualization). Balance handout provided to take home. Written material provided at class time.   Activity Barriers & Risk Stratification:  Activity Barriers & Cardiac Risk Stratification - 01/03/24 1228       Activity Barriers & Cardiac Risk Stratification   Activity Barriers Joint Problems;Deconditioning;Shortness of Breath;Decreased Ventricular Function;History of Falls;Balance Concerns    Cardiac Risk Stratification High   <5 METs on         6 Minute Walk:  6 Minute Walk     Row Name 01/03/24 1227         6 Minute Walk   Phase Initial     Distance 1265 feet     Walk Time 6 minutes     # of Rest Breaks 0     MPH 2.4     METS 3.76     RPE 12     Perceived Dyspnea  0     VO2 Peak 13.17     Symptoms No     Resting HR 86 bpm     Resting BP 132/88     Resting Oxygen  Saturation  98 %     Exercise Oxygen  Saturation  during 6 min walk 98 %     Max Ex. HR 118 bpm     Max Ex. BP 144/102  pt did not take meds due to appt prior     2 Minute Post BP 134/92        Oxygen  Initial Assessment:   Oxygen  Re-Evaluation:   Oxygen  Discharge (Final Oxygen  Re-Evaluation):   Initial Exercise Prescription:  Initial Exercise Prescription - 01/03/24 1200       Date of Initial Exercise RX and Referring Provider  Date 01/03/24    Referring Provider  [name], MD    Expected Discharge Date 03/29/24      NuStep   Level 1    SPM 70    Minutes 15    METs 2      Track   Laps 14    Minutes 15    METs 2      Prescription Details   Frequency (times per week) 3    Duration Progress to 30 minutes of continuous aerobic without signs/symptoms of physical distress      Intensity   THRR 40-80% of Max Heartrate 68-137    Ratings of Perceived Exertion 11-13    Perceived Dyspnea 0-4      Progression   Progression Continue to progress workloads to maintain intensity without signs/symptoms of physical distress.      Resistance Training   Training Prescription Yes    Weight 2    Reps 10-15          Perform Capillary Blood Glucose checks as needed.  Exercise Prescription Changes:   Exercise Prescription Changes     Row Name 01/10/24 1600             Response to Exercise   Blood Pressure (Admit) 122/72       Blood Pressure (Exercise) 140/82       Blood Pressure (Exit) 104/60       Heart Rate (Admit) 87 bpm       Heart Rate (Exercise) 135 bpm       Heart Rate (Exit) 86 bpm       Rating of Perceived Exertion (Exercise) 8       Symptoms None       Comments Pt's first day in the CRP2 program       Duration Continue with 30 min of aerobic exercise without signs/symptoms of physical distress.       Intensity THRR unchanged         Progression   Progression Continue to progress workloads to maintain intensity without signs/symptoms of physical distress.       Average METs 3         Resistance Training   Training Prescription Yes       Weight 2 lbs       Reps 10-15       Time 5 Minutes         Interval Training   Interval Training No         NuStep   Level 1       SPM 99       Minutes 15       METs 2.7         Track   Laps 18       Minutes 15       METs 3.3          Exercise Comments:   Exercise Comments     Row Name 01/10/24 1636 01/26/24 1500         Exercise Comments Pt''s first day in  the CRP2 program. Pt exercised without complaints and is off to a good start. Reviewed METs today with patient. She is making good progress. Pt did not exercise due to feeling lightheaded after warmup.         Exercise Goals and Review:   Exercise Goals     Row Name 01/03/24 1230             Exercise Goals  Increase Physical Activity Yes       Intervention Develop an individualized exercise prescription for aerobic and resistive training based on initial evaluation findings, risk stratification, comorbidities and participant's personal goals.;Provide advice, education, support and counseling about physical activity/exercise needs.       Expected Outcomes Long Term: Exercising regularly at least 3-5 days a week.;Short Term: Attend rehab on a regular basis to increase amount of physical activity.;Long Term: Add in home exercise to make exercise part of routine and to increase amount of physical activity.       Increase Strength and Stamina Yes       Intervention Provide advice, education, support and counseling about physical activity/exercise needs.;Develop an individualized exercise prescription for aerobic and resistive training based on initial evaluation findings, risk stratification, comorbidities and participant's personal goals.       Expected Outcomes Short Term: Increase workloads from initial exercise prescription for resistance, speed, and METs.;Long Term: Improve cardiorespiratory fitness, muscular endurance and strength as measured by increased METs and functional capacity ( );Short Term: Perform resistance training exercises routinely during rehab and add in resistance training at home       Able to understand and use rate of perceived exertion (RPE) scale Yes       Intervention Provide education and explanation on how to use RPE scale       Expected Outcomes Short Term: Able to use RPE daily in rehab to express subjective intensity level;Long Term:  Able to use RPE to guide  intensity level when exercising independently       Knowledge and understanding of Target Heart Rate Range (THRR) Yes       Intervention Provide education and explanation of THRR including how the numbers were predicted and where they are located for reference       Expected Outcomes Long Term: Able to use THRR to govern intensity when exercising independently;Short Term: Able to state/look up THRR;Short Term: Able to use daily as guideline for intensity in rehab       Understanding of Exercise Prescription Yes       Intervention Provide education, explanation, and written materials on patient's individual exercise prescription       Expected Outcomes Short Term: Able to explain program exercise prescription;Long Term: Able to explain home exercise prescription to exercise independently          Exercise Goals Re-Evaluation :  Exercise Goals Re-Evaluation     Row Name 01/10/24 1635             Exercise Goal Re-Evaluation   Exercise Goals Review Increase Physical Activity;Understanding of Exercise Prescription;Increase Strength and Stamina;Knowledge and understanding of Target Heart Rate Range (THRR);Able to understand and use rate of perceived exertion (RPE) scale       Comments Pt's first day in the CRP2 program, Pt understands the exercise Rx, RPE scale and THRR.       Expected Outcomes Will continue to monitor the patient and progress exercise workloads as tolerated.          Discharge Exercise Prescription (Final Exercise Prescription Changes):  Exercise Prescription Changes - 01/10/24 1600       Response to Exercise   Blood Pressure (Admit) 122/72    Blood Pressure (Exercise) 140/82    Blood Pressure (Exit) 104/60    Heart Rate (Admit) 87 bpm    Heart Rate (Exercise) 135 bpm    Heart Rate (Exit) 86 bpm    Rating of Perceived Exertion (Exercise) 8  Symptoms None    Comments Pt's first day in the CRP2 program    Duration Continue with 30 min of aerobic exercise without  signs/symptoms of physical distress.    Intensity THRR unchanged      Progression   Progression Continue to progress workloads to maintain intensity without signs/symptoms of physical distress.    Average METs 3      Resistance Training   Training Prescription Yes    Weight 2 lbs    Reps 10-15    Time 5 Minutes      Interval Training   Interval Training No      NuStep   Level 1    SPM 99    Minutes 15    METs 2.7      Track   Laps 18    Minutes 15    METs 3.3          Nutrition:  Target Goals: Understanding of nutrition guidelines, daily intake of sodium 1500mg , cholesterol 200mg , calories 30% from fat and 7% or less from saturated fats, daily to have 5 or more servings of fruits and vegetables.  Education: Nutrition 1 -Group instruction provided by verbal, written material, interactive activities, discussions, models, and posters to present general guidelines for heart healthy nutrition including macronutrients, label reading, and promoting whole foods over processed counterparts. Education serves as Pensions consultant of discussion of heart healthy eating for all. Written material provided at class time.    Education: Nutrition 2 -Group instruction provided by verbal, written material, interactive activities, discussions, models, and posters to present general guidelines for heart healthy nutrition including sodium, cholesterol, and saturated fat. Providing guidance of habit forming to improve blood pressure, cholesterol, and body weight. Written material provided at class time.     Biometrics:  Pre Biometrics - 01/03/24 1220       Pre Biometrics   Waist Circumference 43 inches    Hip Circumference 49 inches    Waist to Hip Ratio 0.88 %    Triceps Skinfold 48 mm    % Body Fat 47.7 %    Grip Strength 28 kg    Flexibility 15 in    Single Leg Stand 30 seconds           Nutrition Therapy Plan and Nutrition Goals:  Nutrition Therapy & Goals - 01/10/24 1410        Nutrition Therapy   Diet Heart Healthy Diet      Personal Nutrition Goals   Nutrition Goal Patient to identify strategies for reducing cardiovascular risk by attending the [employer] education and nutrition series weekly.    Personal Goal #2 Patient to improve diet quality by using the plate method as a guide for meal planning to include lean protein/plant protein, fruits, vegetables, whole grains, nonfat dairy as part of a well-balanced diet.    Comments [name] has medical history of OSA, HTN, NICM, CHF. She is s/p Sleeve Gastrectomy at 260#; her lowest weight following surgery was 180# (30% total body weight loss). She is motivated to lose weight and recently started working with [facility] [employer] [facility]; per documenation on 01/03/24, they recommended an 1000kcals diet. Patient will benefit from participation in intensive cardiac rehab for nutrition education, exercise, and lifestyle modification.      Intervention Plan   Intervention Prescribe, educate and counsel regarding individualized specific dietary modifications aiming towards targeted core components such as weight, hypertension, lipid management, diabetes, heart failure and other comorbidities.;Nutrition handout(s) given to patient.  Expected Outcomes Short Term Goal: Understand basic principles of dietary content, such as calories, fat, sodium, cholesterol and nutrients.;Long Term Goal: Adherence to prescribed nutrition plan.          Nutrition Assessments:  Nutrition Assessments - 01/13/24 1109       Rate Your Plate Scores   Pre Score 61         MEDIFICTS Score Key: >=70 Need to make dietary changes  40-70 Heart Healthy Diet <= 40 Therapeutic Level Cholesterol Diet  Flowsheet Row INTENSIVE CARDIAC REHAB from 01/12/2024 in [facility]  Picture Your Plate Total Score on Admission 61   Picture Your Plate Scores: <59 Unhealthy dietary pattern with much  room for improvement. 41-50 Dietary pattern unlikely to meet recommendations for good health and room for improvement. 51-60 More healthful dietary pattern, with some room for improvement.  >60 Healthy dietary pattern, although there may be some specific behaviors that could be improved.    Nutrition Goals Re-Evaluation:  Nutrition Goals Re-Evaluation     Row Name 01/10/24 1410             Goals   Current Weight 219 lb 12.8 oz (99.7 kg)       Comment A1c WNL, LDL 83, HDL 67       Expected Outcome [name] has medical history of OSA, HTN, NICM, CHF. She is s/p Sleeve Gastrectomy at 260#; her lowest weight following surgery was 180# (30% total body weight loss). She is motivated to lose weight and recently started working with [facility] [employer] [facility]; per documenation on 01/03/24, they recommended an 1000kcals diet. Patient will benefit from participation in intensive cardiac rehab for nutrition education, exercise, and lifestyle modification.          Nutrition Goals Discharge (Final Nutrition Goals Re-Evaluation):  Nutrition Goals Re-Evaluation - 01/10/24 1410       Goals   Current Weight 219 lb 12.8 oz (99.7 kg)    Comment A1c WNL, LDL 83, HDL 67    Expected Outcome [name] has medical history of OSA, HTN, NICM, CHF. She is s/p Sleeve Gastrectomy at 260#; her lowest weight following surgery was 180# (30% total body weight loss). She is motivated to lose weight and recently started working with [facility] [employer] [facility]; per documenation on 01/03/24, they recommended an 1000kcals diet. Patient will benefit from participation in intensive cardiac rehab for nutrition education, exercise, and lifestyle modification.          Psychosocial: Target Goals: Acknowledge presence or absence of significant depression and/or stress, maximize coping skills, provide positive support system. Participant is able to verbalize types and ability to use techniques and skills needed for reducing  stress and depression.   Education: Stress, Anxiety, and Depression - Group verbal and visual presentation to define topics covered.  Reviews how body is impacted by stress, anxiety, and depression.  Also discusses healthy ways to reduce stress and to treat/manage anxiety and depression. Written material provided at class time.   Education: Sleep Hygiene -Provides group verbal and written instruction about how sleep can affect your health.  Define sleep hygiene, discuss sleep cycles and impact of sleep habits. Review good sleep hygiene tips.   Initial Review & Psychosocial Screening:  Initial Psych Review & Screening - 01/03/24 1231       Initial Review   Current issues with Current Depression      Family Dynamics   Good Support System?  Yes   husband, daughter   Comments [name] shared that she has feelings of depression regarding her health and how it has impacted her life. She feels as if she has lost friends due to her health decline and mostly stays at home with her family doing activites with them. She is highly motivated for cardiac rehab, she wants to meet new people and change her mindset to start working on herself rather than taking care of others. Resources were offered, denies any need for additional resources at this time.      Barriers   Psychosocial barriers to participate in program There are no identifiable barriers or psychosocial needs.      Screening Interventions   Interventions Encouraged to exercise;Provide feedback about the scores to participant    Expected Outcomes Long Term goal: The participant improves quality of Life and PHQ9 Scores as seen by post scores and/or verbalization of changes;Short Term goal: Identification and review with participant of any Quality of Life or Depression concerns found by scoring the questionnaire.;Long Term Goal: Stressors or current issues are controlled or eliminated.;Short Term goal: Utilizing psychosocial counselor, staff and  physician to assist with identification of specific Stressors or current issues interfering with healing process. Setting desired goal for each stressor or current issue identified.          Quality of Life Scores:   Quality of Life - 01/03/24 1235       Quality of Life   Select Quality of Life      Quality of Life Scores   Health/Function Pre 17.93 %    Socioeconomic Pre 18.93 %    Psych/Spiritual Pre 14.71 %    Family Pre 28.8 %    GLOBAL Pre 19.07 %         Scores of 19 and below usually indicate a poorer quality of life in these areas.  A difference of  2-3 points is a clinically meaningful difference.  A difference of 2-3 points in the total score of the Quality of Life Index has been associated with significant improvement in overall quality of life, self-image, physical symptoms, and general health in studies assessing change in quality of life.  PHQ-9: Review Flowsheet  More data exists      01/03/2024 05/06/2023 12/25/2022 08/18/2022 05/04/2022  Depression screen PHQ 2/9  Decreased Interest 2 2 0 2 3 0  Down, Depressed, Hopeless 2 2 3 3 3  0  PHQ - 2 Score 4 4 3 5 6  0  Altered sleeping 2 3 3 3 3  0  Tired, decreased energy 2 2 2 3 3  0  Change in appetite 3 2 0 2 3 0  Feeling bad or failure about yourself  3 2 2 2 2  0  Trouble concentrating 2 3 2 3 2  0  Moving slowly or fidgety/restless 2 1 0 0 1 0  Suicidal thoughts 1 0 0 1 2 0  PHQ-9 Score 19 17 12 19 22  0  Difficult doing work/chores Somewhat difficult Somewhat difficult Very difficult Somewhat difficult Not difficult at all    Details       Multiple values from one day are sorted in reverse-chronological order        Interpretation of Total Score  Total Score Depression Severity:  1-4 = Minimal depression, 5-9 = Mild depression, 10-14 = Moderate depression, 15-19 = Moderately severe depression, 20-27 = Severe depression   Psychosocial Evaluation and Intervention:   Psychosocial Re-Evaluation:   Psychosocial Re-Evaluation  Row Name 01/31/24 1406             Psychosocial Re-Evaluation   Current issues with Current Depression;Current Anxiety/Panic;Current Stress Concerns       Comments [name] states she continues to feel more confident in her ability to walk, climb stairs, and that her depression symptoms continue to decrease.          Psychosocial Discharge (Final Psychosocial Re-Evaluation):  Psychosocial Re-Evaluation - 01/31/24 1406       Psychosocial Re-Evaluation   Current issues with Current Depression;Current Anxiety/Panic;Current Stress Concerns    Comments [name] states she continues to feel more confident in her ability to walk, climb stairs, and that her depression symptoms continue to decrease.          Vocational Rehabilitation: Provide vocational rehab assistance to qualifying candidates.   Vocational Rehab Evaluation & Intervention:  Vocational Rehab - 01/03/24 1429       Initial Vocational Rehab Evaluation & Intervention   Assessment shows need for Vocational Rehabilitation No   disability         Education: Education Goals: Education classes will be provided on a variety of topics geared toward better understanding of heart health and risk factor modification. Participant will state understanding/return demonstration of topics presented as noted by education test scores.  Learning Barriers/Preferences:  Learning Barriers/Preferences - 01/03/24 1429       Learning Barriers/Preferences   Learning Barriers Sight    Learning Preferences Audio;Computer/Internet;Group Instruction;Individual Instruction;Skilled Demonstration;Pictoral;Verbal Instruction;Written Material;Video          General Cardiac Education Topics:  AED/CPR: - Group verbal and written instruction with the use of models to demonstrate the basic use of the AED with the basic ABC's of resuscitation.   Test and Procedures: - Group verbal and visual presentation and models  provide information about basic cardiac anatomy and function. Reviews the testing methods done to diagnose heart disease and the outcomes of the test results. Describes the treatment choices: Medical Management, Angioplasty, or Coronary Bypass Surgery for treating various heart conditions including Myocardial Infarction, Angina, Valve Disease, and Cardiac Arrhythmias. Written material provided at class time.   Medication Safety: - Group verbal and visual instruction to review commonly prescribed medications for heart and lung disease. Reviews the medication, class of the drug, and side effects. Includes the steps to properly store meds and maintain the prescription regimen. Written material provided at class time.   Intimacy: - Group verbal instruction through game format to discuss how heart and lung disease can affect sexual intimacy. Written material provided at class time.   Know Your Numbers and Heart Failure: - Group verbal and visual instruction to discuss disease risk factors for cardiac and pulmonary disease and treatment options.  Reviews associated critical values for Overweight/Obesity, Hypertension, Cholesterol, and Diabetes.  Discusses basics of heart failure: signs/symptoms and treatments.  Introduces Heart Failure Zone chart for action plan for heart failure. Written material provided at class time.   Infection Prevention: - Provides verbal and written material to individual with discussion of infection control including proper hand washing and proper equipment cleaning during exercise session.   Falls Prevention: - Provides verbal and written material to individual with discussion of falls prevention and safety.   Other: -Provides group and verbal instruction on various topics (see comments)   Knowledge Questionnaire Score:  Knowledge Questionnaire Score - 01/03/24 1429       Knowledge Questionnaire Score   Pre Score 24/24  Core Components/Risk  Factors/Patient Goals at Admission:  Personal Goals and Risk Factors at Admission - 01/03/24 1429       Core Components/Risk Factors/Patient Goals on Admission    Weight Management Yes;Obesity;Weight Loss    Intervention Weight Management: Develop a combined nutrition and exercise program designed to reach desired caloric intake, while maintaining appropriate intake of nutrient and fiber, sodium and fats, and appropriate energy expenditure required for the weight goal.;Weight Management: Provide education and appropriate resources to help participant work on and attain dietary goals.;Weight Management/Obesity: Establish reasonable short term and long term weight goals.;Obesity: Provide education and appropriate resources to help participant work on and attain dietary goals.    Expected Outcomes Short Term: Continue to assess and modify interventions until short term weight is achieved;Long Term: Adherence to nutrition and physical activity/exercise program aimed toward attainment of established weight goal;Weight Loss: Understanding of general recommendations for a balanced deficit meal plan, which promotes 1-2 lb weight loss per week and includes a negative energy balance of [phone] kcal/d;Understanding of distribution of calorie intake throughout the day with the consumption of 4-5 meals/snacks;Understanding recommendations for meals to include 15-35% energy as protein, 25-35% energy from fat, 35-60% energy from carbohydrates, less than 200mg  of dietary cholesterol, 20-35 gm of total fiber daily    Diabetes Yes    Intervention Provide education about signs/symptoms and action to take for hypo/hyperglycemia.;Provide education about proper nutrition, including hydration, and aerobic/resistive exercise prescription along with prescribed medications to achieve blood glucose in normal ranges: Fasting glucose 65-99 mg/dL    Expected Outcomes Short Term: Participant verbalizes understanding of the  signs/symptoms and immediate care of hyper/hypoglycemia, proper foot care and importance of medication, aerobic/resistive exercise and nutrition plan for blood glucose control.;Long Term: Attainment of HbA1C < 7%.    Heart Failure Yes    Intervention Provide a combined exercise and nutrition program that is supplemented with education, support and counseling about heart failure. Directed toward relieving symptoms such as shortness of breath, decreased exercise tolerance, and extremity edema.    Expected Outcomes Improve functional capacity of life;Short term: Attendance in program 2-3 days a week with increased exercise capacity. Reported lower sodium intake. Reported increased fruit and vegetable intake. Reports medication compliance.;Short term: Daily weights obtained and reported for increase. Utilizing diuretic protocols set by physician.;Long term: Adoption of self-care skills and reduction of barriers for early signs and symptoms recognition and intervention leading to self-care maintenance.    Hypertension Yes    Intervention Provide education on lifestyle modifcations including regular physical activity/exercise, weight management, moderate sodium restriction and increased consumption of fresh fruit, vegetables, and low fat dairy, alcohol moderation, and smoking cessation.;Monitor prescription use compliance.    Expected Outcomes Short Term: Continued assessment and intervention until BP is < 140/31mm HG in hypertensive participants. < 130/79mm HG in hypertensive participants with diabetes, heart failure or chronic kidney disease.;Long Term: Maintenance of blood pressure at goal levels.    Lipids Yes    Intervention Provide education and support for participant on nutrition & aerobic/resistive exercise along with prescribed medications to achieve LDL 70mg , HDL >40mg .    Expected Outcomes Short Term: Participant states understanding of desired cholesterol values and is compliant with medications  prescribed. Participant is following exercise prescription and nutrition guidelines.;Long Term: Cholesterol controlled with medications as prescribed, with individualized exercise RX and with personalized nutrition plan. Value goals: LDL < 70mg , HDL > 40 mg.    Stress Yes    Intervention Offer individual and/or small group  education and counseling on adjustment to heart disease, stress management and health-related lifestyle change. Teach and support self-help strategies.;Refer participants experiencing significant psychosocial distress to appropriate mental health specialists for further evaluation and treatment. When possible, include family members and significant others in education/counseling sessions.    Expected Outcomes Short Term: Participant demonstrates changes in health-related behavior, relaxation and other stress management skills, ability to obtain effective social support, and compliance with psychotropic medications if prescribed.;Long Term: Emotional wellbeing is indicated by absence of clinically significant psychosocial distress or social isolation.          Education:Diabetes - Individual verbal and written instruction to review signs/symptoms of diabetes, desired ranges of glucose level fasting, after meals and with exercise. Acknowledge that pre and post exercise glucose checks will be done for 3 sessions at entry of program.   Core Components/Risk Factors/Patient Goals Review:   Goals and Risk Factor Review     Row Name 01/31/24 1414             Core Components/Risk Factors/Patient Goals Review   Personal Goals Review Weight Management/Obesity;Diabetes;Heart Failure;Hypertension;Lipids;Stress       Review [name] is doing well with exercise at cardiac rehab. Vital signs have been stable. [name] has increased her met levels.       Expected Outcomes [name] will continue to participate in cardiac rehab for exercise, nutrtion and lifestyle modifications.          Core  Components/Risk Factors/Patient Goals at Discharge (Final Review):   Goals and Risk Factor Review - 01/31/24 1414       Core Components/Risk Factors/Patient Goals Review   Personal Goals Review Weight Management/Obesity;Diabetes;Heart Failure;Hypertension;Lipids;Stress    Review [name] is doing well with exercise at cardiac rehab. Vital signs have been stable. [name] has increased her met levels.    Expected Outcomes [name] will continue to participate in cardiac rehab for exercise, nutrtion and lifestyle modifications.          ITP Comments:  ITP Comments     Row Name 01/03/24 1018 01/10/24 1641 01/31/24 1152       ITP Comments Dr. Wilbert Bihari medical director. Introduction to pritikin education/intensive cardiac rehab. Initial orientation packet reviewed with patient. 30 Day ITP Review. [name] started cardiac rehab on 01/10/24. [name] did well with exercise. 30 Day ITP Review. [name] started cardiac rehab on 01/10/24 and has done well with exercise when in attendance        Comments: see ITP comments

## 2024-02-02 ENCOUNTER — Other Ambulatory Visit (HOSPITAL_COMMUNITY): Payer: Self-pay

## 2024-02-02 ENCOUNTER — Encounter (HOSPITAL_COMMUNITY)
Admission: RE | Admit: 2024-02-02 | Discharge: 2024-02-02 | Disposition: A | Discharge status: Home / Self Care | Source: Ambulatory Visit | Attending: Internal Medicine

## 2024-02-02 DIAGNOSIS — I5042 Chronic combined systolic (congestive) and diastolic (congestive) heart failure: Secondary | ICD-10-CM | POA: Diagnosis not present

## 2024-02-02 NOTE — Progress Notes (Addendum)
 EPIC Encounter for ICM Monitoring  Patient Name: [name] is a 48 y.o. female Date: 02/02/2024 Primary Care Physican: Rollene Almarie LABOR, MD Primary Cardiologist: Bensimhon Electrophysiologist: Inocencio 12/28/2022 Weight: 218 lbs       02/03/2023 Weight: 200 lbs 07/28/2023 Weight: 209 lbs 08/05/2023 Office Weight: 215 lbs 09/27/2023 Weight: 217 lbs 01/25/2024 Office Visit: 210 lbs   Transmission results reviewed and sent via mychart.  Pt states fluid levels seem to be more stable since having ablation.     [facility] HF Index is 0 suggesting normal fluid levels within the last month.     Prescribed: Torsemide  20 mg Take 4 tablets (40 mg total) by mouth twice a day Potassium 20 mEq take 3 tablet(s) (60 mEq total) by mouth twice a day Metolazone  2.5 mg take 1 tablet (2.5 mg total) by mouth as directed.  Per Harlene Gainer, NP 3/13 HF clinic OV discussed limiting metolazone /extra 40 KCL use to 1-2x/week  Spironolactone  25 mg take 1 tablet (25 mg total) at bedtime.    Labs: 01/31/2024 Creatinine 0.85, BUN 15, Potassium 137, Sodium 137, GFR >60 11/29/2023 Creatinine 0.70, BUN 8,   Potassium 3.9, Sodium 145, GFR >90 11/15/2023 Creatinine 0.83, BUN 8,   Potassium 4.3, Sodium 141, GFR >60 A complete set of results can be found in Results Review.   Recommendations:  No changes and encouraged to call if experiencing any fluid symptoms.    Follow-up plan: ICM clinic phone appointment on 03/13/2024.  91 day device clinic remote transmission 03/22/2024.              EP/Cardiology next office visit:  Recall 07/23/2024 with Jodie Passey, PA.   03/16/2024 with Dr Cherrie.            Copy of ICM check sent to Dr. Inocencio.   3 Month [facility] Heart Failure Index:    8 Day Data Trend:          [name], RN 02/02/2024 1:29 PM

## 2024-02-04 ENCOUNTER — Encounter (HOSPITAL_COMMUNITY)
Admission: RE | Admit: 2024-02-04 | Discharge: 2024-02-04 | Disposition: A | Discharge status: Home / Self Care | Source: Ambulatory Visit | Attending: Internal Medicine | Admitting: Internal Medicine

## 2024-02-04 DIAGNOSIS — G4733 Obstructive sleep apnea (adult) (pediatric): Secondary | ICD-10-CM | POA: Diagnosis not present

## 2024-02-04 DIAGNOSIS — I5042 Chronic combined systolic (congestive) and diastolic (congestive) heart failure: Secondary | ICD-10-CM

## 2024-02-04 DIAGNOSIS — I10 Essential (primary) hypertension: Secondary | ICD-10-CM | POA: Diagnosis not present

## 2024-02-07 ENCOUNTER — Encounter (INDEPENDENT_AMBULATORY_CARE_PROVIDER_SITE_OTHER): Payer: Self-pay | Admitting: Internal Medicine

## 2024-02-07 ENCOUNTER — Telehealth (HOSPITAL_COMMUNITY): Payer: Self-pay

## 2024-02-07 ENCOUNTER — Encounter (HOSPITAL_COMMUNITY): Admission: RE | Admit: 2024-02-07 | Source: Ambulatory Visit

## 2024-02-07 NOTE — Telephone Encounter (Signed)
 Patient c/o for 12:30 CR class, no reason given.

## 2024-02-09 ENCOUNTER — Encounter (HOSPITAL_COMMUNITY)
Admission: RE | Admit: 2024-02-09 | Discharge: 2024-02-09 | Disposition: A | Discharge status: Home / Self Care | Source: Ambulatory Visit | Attending: Internal Medicine | Admitting: Internal Medicine

## 2024-02-09 DIAGNOSIS — I5042 Chronic combined systolic (congestive) and diastolic (congestive) heart failure: Secondary | ICD-10-CM | POA: Diagnosis not present

## 2024-02-11 ENCOUNTER — Encounter (HOSPITAL_COMMUNITY)
Admission: RE | Admit: 2024-02-11 | Discharge: 2024-02-11 | Disposition: A | Discharge status: Home / Self Care | Source: Ambulatory Visit | Attending: Internal Medicine | Admitting: Internal Medicine

## 2024-02-11 DIAGNOSIS — I5042 Chronic combined systolic (congestive) and diastolic (congestive) heart failure: Secondary | ICD-10-CM

## 2024-02-14 ENCOUNTER — Encounter (HOSPITAL_COMMUNITY): Admission: RE | Admit: 2024-02-14 | Source: Ambulatory Visit

## 2024-02-14 ENCOUNTER — Telehealth (HOSPITAL_COMMUNITY): Payer: Self-pay

## 2024-02-14 LAB — GLUCOSE, CAPILLARY: Glucose-Capillary: 117 mg/dL — ABNORMAL HIGH (ref 70–99)

## 2024-02-14 NOTE — Telephone Encounter (Signed)
 Patient c/o for 12:30 CR class, no reason given.

## 2024-02-15 ENCOUNTER — Ambulatory Visit: Admitting: Family Medicine

## 2024-02-15 ENCOUNTER — Encounter (INDEPENDENT_AMBULATORY_CARE_PROVIDER_SITE_OTHER): Payer: Self-pay | Admitting: Internal Medicine

## 2024-02-15 ENCOUNTER — Other Ambulatory Visit (HOSPITAL_COMMUNITY): Payer: Self-pay | Admitting: Internal Medicine

## 2024-02-15 ENCOUNTER — Ambulatory Visit (INDEPENDENT_AMBULATORY_CARE_PROVIDER_SITE_OTHER): Admitting: Internal Medicine

## 2024-02-15 ENCOUNTER — Encounter (HOSPITAL_COMMUNITY): Payer: Self-pay | Admitting: Internal Medicine

## 2024-02-15 VITALS — BP 102/72 | HR 73 | Temp 97.9°F | Ht 65.0 in | Wt 202.0 lb

## 2024-02-15 DIAGNOSIS — E66812 Obesity, class 2: Secondary | ICD-10-CM

## 2024-02-15 DIAGNOSIS — I10 Essential (primary) hypertension: Secondary | ICD-10-CM

## 2024-02-15 DIAGNOSIS — I428 Other cardiomyopathies: Secondary | ICD-10-CM

## 2024-02-15 DIAGNOSIS — Z6835 Body mass index (BMI) 35.0-35.9, adult: Secondary | ICD-10-CM | POA: Diagnosis not present

## 2024-02-15 NOTE — Assessment & Plan Note (Signed)
 Significant weight loss of ten pounds since last visit, primarily from body fat reduction. Current body fat percentage decreased from 45% to 39%. Muscle mass maintained, indicating effective weight management. She is exercise sensitive, engaging in regular physical activity including water calisthenics, treadmill walking, and pickleball. Following a 1000 calorie nutrition plan with a focus on protein intake. No current need for weight loss medications as appetite and weight loss are well-controlled. Discussed the importance of listening to physiological hunger cues and avoiding restrictive eating patterns. Emphasized the benefits of gradual weight loss and maintaining muscle mass. - Continue current 1000-1200 calorie nutrition plan with focus on 30% protein, 40% carbs, and 30% healthy fats. - Encourage regular physical activity including water calisthenics, treadmill walking, and pickleball. - Monitor for signs of increased hunger or muscle loss, which may indicate the need to adjust calorie intake. - Utilize resources like [email or web address] for meal planning and recipe ideas. - Avoid creatine supplementation due to potential fluid mobilization and interaction with current medications.

## 2024-02-15 NOTE — Assessment & Plan Note (Signed)
 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 She is attending cardiac rehab and has increased physical activity, which is beneficial for heart function.  She is on metoprolol , spironolactone , loop diuretic, Jardiance  and Entresto .  She is experiencing some fatigue her blood pressure is low normal. - Continue current cardiac medications. - Attend cardiac rehab sessions regularly. - Discuss potential medication adjustments with cardiologist as heart function improves.

## 2024-02-15 NOTE — Progress Notes (Signed)
 Office: [phone]  /  Fax: [phone]  Weight Summary and Body Composition Analysis (BIA)  Vitals Temp: 97.9 F (36.6 C) BP: 102/72 Pulse Rate: 73 SpO2: 97 %   Anthropometric Measurements Height: 5' 5 (1.651 m) Weight: 202 lb (91.6 kg) BMI (Calculated): 33.61 Weight at Last Visit: 212 lb Weight Lost Since Last Visit: 10 lb Weight Gained Since Last Visit: 0 lb Starting Weight: 211 lb Total Weight Loss (lbs): 10 lb (4.536 kg) Peak Weight: 328 lb   Body Composition  Body Fat %: 39.7 % Fat Mass (lbs): 80.2 lbs Muscle Mass (lbs): 115.8 lbs Total Body Water (lbs): 71 lbs Visceral Fat Rating : 10    RMR: 1570  Today's Visit #: 3  Starting Date: 01/03/24   Subjective   Chief Complaint: Obesity  Interval History Discussed the use of AI scribe software for clinical note transcription with the patient, who gave verbal consent to proceed.  History of Present Illness [name] is a 48 year old female with a history of heart failure who presents for medical weight management.  She has lost ten pounds since her last office visit, attributing this to adhering to a one thousand calorie nutrition plan 95% of the time and increasing her physical activity. Her exercise regimen includes water calisthenics, weight training in the pool, attending cardiac rehab three times a week, walking on a treadmill for 30 to 45 minutes, and playing pickleball. She also uses a walking pad at home when the gym is crowded.  She feels good about her nutrition but sometimes struggles to eat all the food she prepares, particularly at lunch. To manage this, she eats five small meals a day, such as a rice cake with tuna or a protein shake with half a grapefruit. She has adjusted her meal planning to ensure she meets her calorie goals, often eating small meals throughout the day.  She has a history of heart failure and notes that there are days when she does not feel well and does not  engage in physical activity, which makes her feel 'bummy.' Since her heart ablation, she reports that her heart function feels better. She is concerned that her current medications might be too strong given her improved heart function and lower blood pressure readings.  She is on GBMT which she notes all lower blood pressure. She has been monitoring her blood pressure, which has been low, and she feels tired and dizzy at times.  She has been focusing on her protein intake and meal preparation, which she finds has become easier over time. She uses an app to track her meals and calories, ensuring she meets her nutritional goals. She has been using resources like [employer] and [address] [email or web address] for meal planning and recipes.     Challenges affecting patient progress: medical comorbidities.    Pharmacotherapy for weight management: She is currently taking no anti-obesity medication.   Assessment and Plan   Treatment Plan For Obesity:  Recommended Dietary Goals  [name] is currently in the action stage of change. As such, her goal is to continue weight management plan. She has agreed to: continue current plan  Behavioral Health and Counseling  We discussed the following behavioral modification strategies today: continue to work on maintaining a reduced calorie state, getting the recommended amount of protein, incorporating whole foods, making healthy choices, staying well hydrated and practicing mindfulness when eating. and increase protein intake, fibrous foods (25 grams per day for women, 30 grams for men) and water  to improve satiety and decrease hunger signals. .  Additional education and resources provided today: None  Recommended Physical Activity Goals  [name] has been advised to work up to 150 minutes of moderate intensity aerobic activity a week and strengthening exercises 2-3 times per week for cardiovascular health, weight loss maintenance and preservation of muscle mass.  She has  agreed to :  Continue current level of physical activity   Medical Interventions and Pharmacotherapy  We discussed various medication options to help [name] with her weight loss efforts and we both agreed to : Continue with current nutritional and behavioral strategies  Associated Conditions Impacted by Obesity Treatment  Assessment & Plan Essential hypertension Blood pressure  is on the low normal side.  On metoprolol , spironolactone , loop diuretic, Jardiance  and Entresto  without adverse effects.  Most recent renal parameters reviewed which showed stable electrolytes and kidney function. Since she is starting to experience some fatigue she may need adjustments to her cardiac medications as she is losing weight and engaging in regular physical activity.  She will be monitoring her blood pressure at home and be reporting to cardiologist. 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 She is attending cardiac rehab and has increased physical activity, which is beneficial for heart function.  She is on metoprolol , spironolactone , loop diuretic, Jardiance  and Entresto .  She is experiencing some fatigue her blood pressure is low normal. - Continue current cardiac medications. - Attend cardiac rehab sessions regularly. - Discuss potential medication adjustments with cardiologist as heart function improves. Class 2 severe obesity with serious comorbidity and body mass index (BMI) of 35.0 to 35.9 in adult, unspecified obesity type Significant weight loss of ten pounds since last visit, primarily from body fat reduction. Current body fat percentage decreased from 45% to 39%. Muscle mass maintained, indicating effective weight management. She is exercise sensitive, engaging in regular physical activity including water calisthenics, treadmill walking, and pickleball. Following a 1000  calorie nutrition plan with a focus on protein intake. No current need for weight loss medications as appetite and weight loss are well-controlled. Discussed the importance of listening to physiological hunger cues and avoiding restrictive eating patterns. Emphasized the benefits of gradual weight loss and maintaining muscle mass. - Continue current 1000-1200 calorie nutrition plan with focus on 30% protein, 40% carbs, and 30% healthy fats. - Encourage regular physical activity including water calisthenics, treadmill walking, and pickleball. - Monitor for signs of increased hunger or muscle loss, which may indicate the need to adjust calorie intake. - Utilize resources like [email or web address] for meal planning and recipe ideas. - Avoid creatine supplementation due to potential fluid mobilization and interaction with current medications.        Objective   Physical Exam:  Blood pressure 102/72, pulse 73, temperature 97.9 F (36.6 C), height 5' 5 (1.651 m), weight 202 lb (91.6 kg), SpO2 97%. Body mass index is 33.61 kg/m.  General: She is overweight, cooperative, alert, well developed, and in no acute distress. PSYCH: Has normal mood, affect and thought process.   HEENT: EOMI, sclerae are anicteric. Lungs: Normal breathing effort, no conversational dyspnea. Extremities: No edema.  Neurologic: No gross sensory or motor deficits. No tremors or fasciculations noted.    Diagnostic Data Reviewed:  BMET    Component Value Date/Time   NA 137 01/31/2024 1220   NA 138 07/23/2023 1322   K 4.2 01/31/2024 1220   CL 102 01/31/2024 1220  CO2 25 01/31/2024 1220   GLUCOSE 105 (H) 01/31/2024 1220   BUN 15 01/31/2024 1220   BUN 22 07/23/2023 1322   CREATININE 0.85 01/31/2024 1220   CREATININE 0.90 03/26/2016 1536   CALCIUM 9.3 01/31/2024 1220   GFRNONAA >60 01/31/2024 1220   GFRNONAA >89 01/18/2014 1621   GFRAA >60 02/15/2020 1845   GFRAA >89 01/18/2014 1621   Lab Results  Component Value  Date   HGBA1C 5.2 07/16/2023   HGBA1C 5.4 11/09/2015   Lab Results  Component Value Date   INSULIN  11.8 01/03/2024   Lab Results  Component Value Date   TSH 1.750 07/23/2023   CBC    Component Value Date/Time   WBC 5.5 07/16/2023 1218   RBC 4.85 07/16/2023 1218   HGB 13.4 07/16/2023 1218   HGB 10.6 (L) 09/07/2019 1159   HCT 42.1 07/16/2023 1218   HCT 32.6 (L) 08/06/2020 1517   PLT 279 07/16/2023 1218   PLT 278 09/07/2019 1159   MCV 86.8 07/16/2023 1218   MCV 82 09/07/2019 1159   MCH 27.6 07/16/2023 1218   MCHC 31.8 07/16/2023 1218   RDW 15.0 07/16/2023 1218   RDW 14.7 09/07/2019 1159   Iron  Studies    Component Value Date/Time   IRON  61 07/16/2023 1218   TIBC 472 (H) 07/16/2023 1218   FERRITIN 11 07/16/2023 1218   IRONPCTSAT 13 07/16/2023 1218   Lipid Panel     Component Value Date/Time   CHOL 164 06/11/2023 1107   TRIG 66.0 06/11/2023 1107   HDL 67.00 06/11/2023 1107   CHOLHDL 2 06/11/2023 1107   VLDL 13.2 06/11/2023 1107   LDLCALC 83 06/11/2023 1107   Hepatic Function Panel     Component Value Date/Time   PROT 6.7 01/21/2023 1523   PROT 7.3 05/15/2021 1032   ALBUMIN  3.8 01/21/2023 1523   ALBUMIN  4.5 05/15/2021 1032   AST 38 01/21/2023 1523   ALT 30 01/21/2023 1523   ALKPHOS 64 01/21/2023 1523   BILITOT 1.1 01/21/2023 1523   BILITOT 0.3 05/15/2021 1032   BILIDIR <0.1 08/26/2020 0201   IBILI 0.6 11/21/2017 2155      Component Value Date/Time   TSH 1.750 07/23/2023 1321   Nutritional Lab Results  Component Value Date   VD25OH 24.1 (L) 01/03/2024   VD25OH 14.88 (L) 06/11/2023   VD25OH 12.60 (L) 07/29/2020    Medications: Outpatient Encounter Medications as of 02/15/2024  Medication Sig   acetaminophen  (TYLENOL ) 650 MG CR tablet Take 650 mg by mouth every 8 (eight) hours as needed for pain (headache).   albuterol  (VENTOLIN  HFA) 108 (90 Base) MCG/ACT inhaler Inhale 1-2 puffs into the lungs every 6 (six) hours as needed for wheezing or  shortness of breath.   budesonide -formoterol  (SYMBICORT ) 160-4.5 MCG/ACT inhaler Inhale 2 puffs into the lungs 2 (two) times daily as needed.   diclofenac  (VOLTAREN ) 75 MG EC tablet Take 1 tablet by mouth twice daily as needed   dicyclomine  (BENTYL ) 20 MG tablet Take 1 tablet (20 mg total) by mouth daily as needed for spasms.   fluticasone  (FLONASE ) 50 MCG/ACT nasal spray Place 2 sprays into both nostrils daily as needed for allergies.   gabapentin  (NEURONTIN ) 300 MG capsule Take 1 capsule (300 mg total) by mouth at bedtime as needed (back spasms).   ipratropium (ATROVENT ) 0.02 % nebulizer solution Inhale 500 mcg into the lungs.   ipratropium-albuterol  (DUONEB) 0.5-2.5 (3) MG/3ML SOLN USE 1 AMPULE IN NEBULIZER EVERY 6 HOURS AS NEEDED  ivabradine  (CORLANOR ) 7.5 MG TABS tablet Take 1 tablet (7.5 mg total) by mouth 2 (two) times daily with a meal.   JARDIANCE  25 MG TABS tablet TAKE 1 TABLET BY MOUTH ONCE DAILY BEFORE BREAKFAST   Magnesium  Oxide 200 MG TABS Take 1 tablet (200 mg total) by mouth daily.   metolazone  (ZAROXOLYN ) 2.5 MG tablet TAKE 1 TABLET BY MOUTH AS DIRECTED   metoprolol  succinate (TOPROL -XL) 50 MG 24 hr tablet Take 1 tablet (50 mg total) by mouth daily. Take with or immediately following a meal.   nitroGLYCERIN  (NITROSTAT ) 0.4 MG SL tablet Place 1 tablet (0.4 mg total) under the tongue every 5 (five) minutes as needed for chest pain.   OVER THE COUNTER MEDICATION Take 1 capsule by mouth daily. BARIATRIC VITAMIN   potassium chloride  SA (KLOR-CON  M) 20 MEQ tablet Take 3 tablets (60 mEq total) by mouth 2 (two) times daily.   sacubitril -valsartan  (ENTRESTO ) 49-51 MG Take 1 tablet by mouth 2 (two) times daily.   spironolactone  (ALDACTONE ) 25 MG tablet Take 1 tablet (25 mg total) by mouth at bedtime.   tizanidine  (ZANAFLEX ) 2 MG capsule Take 2 mg by mouth as needed for muscle spasms.   torsemide  (DEMADEX ) 20 MG tablet Take 40 mg by mouth 2 (two) times daily.   Ubrogepant  (UBRELVY ) 100 MG  TABS Take 1 tablet (100 mg total) by mouth daily as needed (migraine).   Vitamin D , Ergocalciferol , (DRISDOL ) 1.25 MG (50000 UNIT) CAPS capsule Take 1 capsule by mouth once a week   No facility-administered encounter medications on file as of 02/15/2024.     Follow-Up   Return in about 3 weeks (around 03/07/2024) for For Weight Mangement with Dr. Francyne.SABRA She was informed of the importance of frequent follow up visits to maximize her success with intensive lifestyle modifications for her multiple health conditions.  Attestation Statement   Reviewed by clinician on day of visit: allergies, medications, problem list, medical history, surgical history, family history, social history, and previous encounter notes.     Lucas Francyne, MD

## 2024-02-15 NOTE — Assessment & Plan Note (Signed)
 Blood pressure  is on the low normal side.  On metoprolol , spironolactone , loop diuretic, Jardiance  and Entresto  without adverse effects.  Most recent renal parameters reviewed which showed stable electrolytes and kidney function. Since she is starting to experience some fatigue she may need adjustments to her cardiac medications as she is losing weight and engaging in regular physical activity.  She will be monitoring her blood pressure at home and be reporting to cardiologist.

## 2024-02-16 ENCOUNTER — Other Ambulatory Visit: Payer: Self-pay | Admitting: Family Medicine

## 2024-02-16 ENCOUNTER — Encounter (HOSPITAL_COMMUNITY)
Admission: RE | Admit: 2024-02-16 | Discharge: 2024-02-16 | Disposition: A | Discharge status: Home / Self Care | Source: Ambulatory Visit | Attending: Internal Medicine | Admitting: Internal Medicine

## 2024-02-16 DIAGNOSIS — I5042 Chronic combined systolic (congestive) and diastolic (congestive) heart failure: Secondary | ICD-10-CM | POA: Diagnosis not present

## 2024-02-17 ENCOUNTER — Other Ambulatory Visit (HOSPITAL_COMMUNITY): Payer: Self-pay | Admitting: Internal Medicine

## 2024-02-17 DIAGNOSIS — I5042 Chronic combined systolic (congestive) and diastolic (congestive) heart failure: Secondary | ICD-10-CM

## 2024-02-18 ENCOUNTER — Other Ambulatory Visit: Payer: Self-pay | Admitting: Internal Medicine

## 2024-02-18 ENCOUNTER — Encounter (HOSPITAL_COMMUNITY)
Admission: RE | Admit: 2024-02-18 | Discharge: 2024-02-18 | Disposition: A | Discharge status: Home / Self Care | Source: Ambulatory Visit | Attending: Internal Medicine | Admitting: Internal Medicine

## 2024-02-18 DIAGNOSIS — I5042 Chronic combined systolic (congestive) and diastolic (congestive) heart failure: Secondary | ICD-10-CM

## 2024-02-18 NOTE — Progress Notes (Incomplete)
 [facility] Note   PCP: Rollene Almarie LABOR, MD Primary Cardiologist: [name], MD (Inactive)  HF Cardiologist: Dr. 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   Admitted 7/19 with CP Symptoms thought to be related to frequent PVCs. EP consulted and scheduled her for PVC ablation. Echo repeated 11/22/17 and showed improved EF 50-55%. S/p  PVC ablation 12/01/17. Was felt not to be successful.  Was on flecainide  but discontinued due to reduced EF. Placed on amio   Echo 6/20 showed drop in EF back down to 25-30%. Was instructed to f/u in clinic but pt did not. She had outpatient monitor 04/2018 that showed rare PVCs.    Admitted 6/21 with chest pain and shortness of breath.ECHO EF < 20% and normal RV. Had cath with normal cors and preserved cardiac output. Plan was to f/u with EP regarding PVCs. She has remained on AAD therapy w/ amiodarone  + ? blocker therapy.  Admitted to [facility] 4/22 w/ increased dyspnea and volume overload. Also w/ increased PVC burden. Echo showed EF 20-25%, RV normal. She was diuresed w/ IV Lasix  and placed on amiodarone  gtt for PVC suppression. Seen by Dr. Waddell, due to the location of her PVCs, she is not a candidate for re-do ablation.   S/p Barostim 6/22.  Zio 8/22: 13% PVCs (unifocal)  Seen by EP 12/22 and Barostim titrated. Not felt to have any other options for PVCs.   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  -> mexilitene increased to 300 bid  Cardiac PET 6/24 no ischemia/infarction. No active myocardial inflammation/ evidence of sarcoidosis. 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 Had f/u with Dr. Waddell (EP). 11/02/23 Zio placed to re-quantify PVC burden If > 20% plan repeat PVC ablation. < 10% medical rx. If 10-20% would discuss options  Today she returns for HF follow up. Remains fatigued. Can do all ADLs if she takes her time. Winded with steps but can do them. Fluid doing pretty well.   ICD interrogation: HL score 6. No VT/AF. Fluid ok Activity level 0.8/hr. Personally reviewed   Cardiac Studies   - Cardiac PET (6/24):    FDG uptake was not observed. LV perfusion is normal. There is no evidence of ischemia. There is no evidence of infarction.   Coronary calcium was absent on the attenuation correction CT images.   FDG uptake findings are inconsistent with active myocardial inflammation/sarcoidosis.   No gated images or EF due to interference from AICD leads  - Echo (7/23): EF 25-30%  - Echo (1/23): EF 25-30% RV normal   - Echo (4/22): EF 20-25%, RV normal   - PFTs w/ DLOC (4/22) Mild Restriction Normal Diffusion  - CPX (7/21) FVC 2.32 (70%)      FEV1 1.99 (74%)        FEV1/FVC 86 (105%)        MVV 110 (105%) Resting HR: 94 Peak HR: 175   (100% age predicted max HR) BP rest: 126/82 BP peak: 202/78 Peak VO2: 17.6 (86% predicted peak VO2) -  corrects to 28.4 for ibw VE/VCO2 slope:  37 OUES: 1.74 Peak RER: 1.08 Ventilatory Threshold: 14.1 (69% predicted or measured peak VO2) VE/MVV:  53% PETCO2 at peak:   O2pulse:  10   (83% predicted O2pulse)  - RHC (6/21):   RA = 4 RV = 32/6 PA = 31/8 (21) PCW = 12 Fick cardiac output/index = 7.0/3.4 Thermo CO/CI = 5.2/2.5 PVR =1.7 (Thermo) Ao sat = 98% PA sat = 67%, 69%  - Echo (6/21): EF < 20% RV normal   - Echo (6/20): EF 25-30%  - Echo (7/19): EF 50-55%  - CPX (12/18): pVO2 14.0 (corrects to 23.0 for IBW) VeVCO2  32 RER 1.0  - Echo (8/18): EF 25%   - CPX (2/18): FVC 2.57 (79%)      FEV1 2.14 (81%)        FEV1/FVC 83 (101%)        MVV 107 (102%) Resting HR:  106 Peak HR: 166   (93% age predicted max HR) BP rest: 136/98 BP peak: 174/88 Peak VO2: 17.1 (85% predicted peak VO2) - corrects to 28.4 for ibw VE/VCO2 slope:  33 OUES: 2.16 Peak RER: 1.09 Ventilatory Threshold: 14.6 (73% predicted or measured peak VO2) VE/MVV:  59% PETCO2 at peak:  33 O2pulse:  10   (83% predicted O2pulse)  - Echo (12/17) 20-25%  - Cath (6/17): revealed normal vessels, moderate elevation in pulmonary artery pressures, and LVEF 25-30%.  - Echo (6/17): 25-30%  - Echo (6/15): EF 35-40%   Review of systems complete and found to be negative unless listed in HPI.    Past Medical History:  Diagnosis Date   Acute on chronic systolic (congestive) heart failure (HCC) 04/29/2017   Acute pain of right shoulder 06/16/2017   ADHD    AICD (automatic cardioverter/defibrillator) present    Anginal pain    Anxiety    Arthritis    right shoulder    Asthma    Back pain    CHF (congestive heart failure) (HCC)    Chronic combined systolic and diastolic heart failure (HCC) 03/05/2014   Closed low lateral malleolus fracture 10/23/2013   Constipation    Cystitis 10/21/2017   Depression    Depression with anxiety 01/20/2013   Diabetes mellitus without complication (HCC)    Diverticulosis    Dyspnea    comes and goes intermittently mostly with exertion    Dysrhythmia    Edema, lower extremity    Essential hypertension    Prev followed by [name]    Fibroid    age 34   Gallstones    Generalized abdominal cramping    History of cardiomyopathy    Hypertension    IBS (irritable bowel syndrome)    ICD (implantable cardioverter-defibrillator), single, 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   Insomnia 05/07/2017   Joint pain    Labral tear of shoulder 04/04/2015    Injected 04/04/2015 Injected 12/03/2015    Migraine    monthly (08/03/2016)   Myofascial pain 06/16/2017   NICM (nonischemic cardiomyopathy) (HCC) 08/03/2016   Nonallopathic lesion of lumbosacral region 11/16/2016    Nonallopathic lesion of sacral region 11/16/2016   Nonallopathic lesion of thoracic region 08/20/2014   Nonspecific chest pain 04/28/2017   Obesity    OSA (obstructive sleep apnea) 01/02/2013   NPSG 2009:  AHI 9/hr. CPAP intolerance >> smothering Good tolerance of auto device (optimal pressure 12-13 on download).  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     albuterol  (VENTOLIN  HFA) 108 (90 Base) MCG/ACT inhaler Inhale 1-2 puffs into the lungs every 6 (six) hours as needed for wheezing or shortness of breath. 18 g 0   budesonide -formoterol  (SYMBICORT ) 160-4.5 MCG/ACT inhaler Inhale 2 puffs into the lungs 2 (two) times daily as needed. 10 g 3   diclofenac  (VOLTAREN ) 75 MG EC tablet Take 1 tablet by mouth twice daily as needed 60 tablet 0   dicyclomine  (BENTYL ) 20 MG tablet Take 1 tablet (20 mg total) by mouth daily as needed for spasms. 90 tablet 3   fluticasone  (FLONASE ) 50 MCG/ACT nasal spray Place 2 sprays into both nostrils daily as needed for allergies. 16 g 3   gabapentin  (NEURONTIN ) 300 MG capsule Take 1 capsule (300 mg total) by mouth at bedtime as needed (back spasms). 90 capsule 3   ipratropium (ATROVENT ) 0.02 % nebulizer solution Inhale 500 mcg into the lungs.     ipratropium-albuterol  (DUONEB) 0.5-2.5 (3) MG/3ML SOLN USE 1 AMPULE IN NEBULIZER EVERY 6 HOURS AS NEEDED 360 mL 0   ivabradine  (CORLANOR )  7.5 MG TABS tablet Take 1 tablet (7.5 mg total) by mouth 2 (two) times daily with a meal. 60 tablet 11   JARDIANCE  25 MG TABS tablet TAKE 1 TABLET BY MOUTH ONCE DAILY BEFORE BREAKFAST 90 tablet 0   Magnesium  Oxide 200 MG TABS Take 1 tablet (200 mg total) by mouth daily. 90 tablet 3   metolazone  (ZAROXOLYN ) 2.5 MG tablet TAKE 1 TABLET BY MOUTH AS DIRECTED 5 tablet 0   metoprolol  succinate (TOPROL -XL) 50 MG 24 hr tablet Take 1 tablet (50 mg total) by mouth daily. Take with or immediately following a meal. 90 tablet 3   nitroGLYCERIN  (NITROSTAT ) 0.4 MG SL tablet Place 1 tablet (0.4 mg total) under the tongue every 5 (five) minutes as needed for chest pain. 75 tablet 0   OVER THE COUNTER MEDICATION Take 1 capsule by mouth daily. BARIATRIC VITAMIN     potassium chloride  SA (KLOR-CON  M) 20 MEQ tablet Take 3 tablets (60 mEq total) by mouth 2 (two) times daily. 180 tablet 6   sacubitril -valsartan  (ENTRESTO ) 49-51 MG Take 1 tablet by mouth 2 (two) times daily. 60 tablet 6   spironolactone  (ALDACTONE ) 25 MG tablet Take 1 tablet (25 mg total) by mouth at bedtime. 90 tablet 3   tizanidine  (ZANAFLEX ) 2 MG capsule Take 2 mg by mouth as needed for muscle spasms.     torsemide  (DEMADEX ) 20 MG tablet Take 40 mg by mouth 2 (two) times daily.     Ubrogepant  (UBRELVY ) 100 MG TABS Take 1 tablet (100 mg total) by mouth daily as needed (migraine). 30 tablet 3   Vitamin D , Ergocalciferol , (DRISDOL ) 1.25 MG (50000 UNIT) CAPS capsule Take 1 capsule by mouth once a week 12 capsule 0   No current facility-administered medications  for this visit.   Allergies  Allergen Reactions   Vancomycin  Other (See Comments)    did something to my kidneys, PROGRESSED TO KIDNEY FAILURE!!   Aspirin  Other (See Comments)    Wheezing, (Pt states that she just wheezes some when she takes aspirin  by itself but she can take aspirin  in a combination product).   Contrast Media [Iodinated Contrast Media] Other (See Comments)    Multiple CT  contrast studies done over 2 weeks caused ARF   Ciprofloxacin Itching and Rash   Cyclobenzaprine  Other (See Comments)    Can not tolerate   Farxiga  [Dapagliflozin] Rash   Sulfa Antibiotics Itching and Rash   Social History   Socioeconomic History   Marital status: Married    Spouse name: Beryl   Number of children: 1   Years of education: Not on file   Highest education level: Associate degree: academic program  Occupational History   Occupation: stay at home mom  Tobacco Use   Smoking status: Never    Passive exposure: Never   Smokeless tobacco: Never  Vaping Use   Vaping status: Never Used  Substance and Sexual Activity   Alcohol use: No   Drug use: No   Sexual activity: Yes    Birth control/protection: Surgical, Pill    Comment: BTL  Other Topics Concern   Not on file  Social History Narrative   Lives with husband, daughter niece and great nephew   Social Drivers of Corporate investment banker Strain: Low Risk  (06/08/2023)   Overall Financial Resource Strain (CARDIA)    Difficulty of Paying Living Expenses: Not very hard  Food Insecurity: No Food Insecurity (06/08/2023)   Hunger Vital Sign    Worried About Running Out of Food in the Last Year: Never true    Ran Out of Food in the Last Year: Never true  Transportation Needs: Unmet Transportation Needs (06/08/2023)   PRAPARE - Administrator, Civil Service (Medical): Yes    Lack of Transportation (Non-Medical): No  Physical Activity: Insufficiently Active (06/08/2023)   Exercise Vital Sign    Days of Exercise per Week: 1 day    Minutes of Exercise per Session: 10 min  Stress: Stress Concern Present (06/08/2023)   [employer] of Occupational Health - Occupational Stress Questionnaire    Feeling of Stress : Very much  Social Connections: Socially Isolated (06/08/2023)   Social Connection and Isolation Panel    Frequency of Communication with Friends and Family: Never    Frequency of Social  Gatherings with Friends and Family: Never    Attends Religious Services: Never    Database administrator or Organizations: No    Attends Banker Meetings: Never    Marital Status: Married  Catering manager Violence: Not At Risk (05/06/2023)   Humiliation, Afraid, Rape, and Kick questionnaire    Fear of Current or Ex-Partner: No    Emotionally Abused: No    Physically Abused: No    Sexually Abused: No   Family History  Problem Relation Age of Onset   Rheum arthritis Mother    Obesity Mother    Emphysema Maternal Grandmother        smoked   Heart disease Maternal Grandmother 65       MI   Allergies Daughter    Colon cancer Neg Hx    There were no vitals taken for this visit.  Wt Readings from Last 3 Encounters:  02/15/24 91.6 kg (202  lb)  01/25/24 95.3 kg (210 lb)  01/18/24 97.6 kg (215 lb 3.2 oz)   PHYSICAL EXAM: General:  Well appearing. No resp difficulty HEENT: normal Neck: supple. no JVD. Carotids 2+ bilat; no bruits. No lymphadenopathy or thryomegaly appreciated. Cor: PMI nondisplaced. Regular tachy  Lungs: clear Abdomen: obese soft, nontender, nondistended. No hepatosplenomegaly. No bruits or masses. Good bowel sounds. Extremities: no cyanosis, clubbing, rash, edema Neuro: alert & orientedx3, cranial nerves grossly intact. moves all 4 extremities w/o difficulty. Affect pleasant  ECG: ST 102 freq PVCs Personally reviewed  Device interrogation (personally reviewed from 07/31/33): ICD interrogation: HL score 6. No VT/AF. Fluid ok Activity level 0.8/hr. Personally reviewed  ASSESSMENT & PLAN:  1. Chronic systolic HF in setting of TTN CM:   - due to TTN CM, onset 1999.  - S/P [employer] ICD 07/2016 - s/p Barostim 6/22 - LHC 6/17 No CAD - Echo 12/17 EF 20-25% - Echo 8/18 EF 25-30% s/p ICD 3/18.  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 Suspect progressive course. Has been referred to Dr. Joseph>>Variant is interpreted as variant of unknown clinical significance. Per Dr. Thera, as there are no affected family members to determine if this variant segregates with disease, genetic testing her daughter for this familial TTN variant is unwarranted  - Cardiac PET 6/24: no ischemia/infarction. No active myocardial inflammation/ evidence of sarcoidosis - Stable NYHA III. Volume ok on torsemide  40 bid. Takes metolazone  as needed (1-2/month) - Continue digoxin  0.125 mg daily.  - Continue Jardiance  25 mg daily. - Continue Entesto 97/103 mg bid. - Continue spiro 25 mg qhs. - Continue Toprol  XL 100 mg daily  - Continue Corlanor  7.5 mg bid. - ICD interrogation: HL score 6. No VT/AF. Fluid ok Activity level 0.8/hr. Personally reviewed - Unclear if PVCs are a result of her CM or contributing to it (or both). Failed previous PVC ablation. C/w high burden despite 2 AAD therapy regimen w/ amiodarone  and mexiletine.  - Suspect she may need advanced therapies in future but currently too early.  - Will check CPX  - Refer to CR  2. Frequent PVCs/ Bigeminy - Holter Monitor 6/19 with 30% PVCs with one primary morphology.  - S/p PVC ablation 11/2017 with Dr Waddell unsuccessful - Off flecainide  with EF down and persistent PVCs.  - Zio Patch 04/2019 > 17% PVCs and >8% couplets.  - Zio Patch 11/28/2019 8.5 % PVCs - Zio 9/22 13% PVCs (unifocal)  - Zio 12/23 showed frequent NSVT, 13% PVC burden (see discussion above) - Cardiac PET 6/24 negative for sarcoid  - Saw Dr. Waddell (EP). 11/02/23 Zio placed to re-quantify PVC burden If > 20% plan repeat PVC ablation. < 10% medical rx. If 10-20% would discuss options - Continue amiodarone  200 mg daily. - Continue mexiletine 300 mg bid.   3. Morbid obesity - s/p gastric Sleeve at [facility] 09/20/17  - There is no height or weight on file to calculate BMI. -  [facility] met with [facility]  - Met with PharmD regarding GLP1RA but insurance wouldn't pay for it   4. Daytime Fatigue - Sleep study in 2019 and again in 2021 were both negative for sleep apnea  - No change  5. DM 2 - On Jardiance   - Per PCP   Harlene  CHRISTELLA Gainer, FNP  12:52 PM

## 2024-02-21 ENCOUNTER — Encounter (HOSPITAL_COMMUNITY): Admission: RE | Admit: 2024-02-21 | Source: Ambulatory Visit

## 2024-02-22 ENCOUNTER — Telehealth: Payer: Self-pay | Admitting: *Deleted

## 2024-02-22 ENCOUNTER — Telehealth (HOSPITAL_COMMUNITY): Payer: Self-pay

## 2024-02-22 NOTE — Telephone Encounter (Signed)
 Called to confirm/remind patient of their appointment at the [facility] on 02/23/24.   Appointment:   [x] Confirmed  [] Left mess   [] No answer/No voice mail  [] VM Full/unable to leave message  [] Phone not in service  Patient reminded to bring all medications and/or complete list.  Confirmed patient has transportation. Gave directions, instructed to utilize valet parking.

## 2024-02-22 NOTE — Telephone Encounter (Signed)
 I am sorry that I do not see availability on my schedule until 02/28/24.  Ok to see Dr. Dallie if she needs an appointment this week.

## 2024-02-22 NOTE — Progress Notes (Signed)
 48 y.o. G37P1011 female with uterine fibroids (s/p [place] 10/28/22), h/o BTL here for problem visit. Married.  No LMP recorded. Patient has had an ablation.   She reports left lower abdominal and groin pain since Friday, 9/28. Constant pain and pain to touch, worse with lying down and standing. Stabbing pain with sitting. Pain rated 6/10 today, 8/10 when started. Used Tylenol  and heating pad.  Mild constipation. Has used stool softeners. Last BM yesterday. No N/V. Pain is unchanged with urination or BM. No recent trauma. No pain like this before. Works out regularly but no recent injury  Birth control: BTL Last mammogram: 11/11/23 density b, birads 1 neg  OB History  Gravida Para Term Preterm AB Living  2 1 1  1 1   SAB IAB Ectopic Multiple Live Births   1   1    # Outcome Date GA Lbr Len/2nd Weight Sex Type Anes PTL Lv  2 IAB           1 Term     F CS-Unspec   LIV   Past Medical History:  Diagnosis Date   Acute on chronic systolic (congestive) heart failure (HCC) 04/29/2017   Acute pain of right shoulder 06/16/2017   ADHD    AICD (automatic cardioverter/defibrillator) present    Anginal pain    Anxiety    Arthritis    right shoulder    Asthma    Back pain    CHF (congestive heart failure) (HCC)    Chronic combined systolic and diastolic heart failure (HCC) 03/05/2014   Closed low lateral malleolus fracture 10/23/2013   Constipation    Cystitis 10/21/2017   Depression    Depression with anxiety 01/20/2013   Diabetes mellitus without complication (HCC)    Diverticulosis    Dyspnea    comes and goes intermittently mostly with exertion    Dysrhythmia    Edema, lower extremity    Essential hypertension    Prev followed by [name]    Fibroid    age 68   Gallstones    Generalized abdominal cramping    History of cardiomyopathy    Hypertension    IBS (irritable bowel syndrome)    ICD (implantable cardioverter-defibrillator), single, 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    Insomnia 05/07/2017   Joint pain    Labral tear of shoulder 04/04/2015    Injected 04/04/2015 Injected 12/03/2015    Migraine    monthly (08/03/2016)   Myofascial pain 06/16/2017   NICM (nonischemic cardiomyopathy) (HCC) 08/03/2016   Nonallopathic lesion of lumbosacral region 11/16/2016   Nonallopathic lesion of sacral region 11/16/2016   Nonallopathic lesion of thoracic region 08/20/2014   Nonspecific chest pain 04/28/2017   Obesity    OSA (obstructive sleep apnea) 01/02/2013   NPSG 2009:  AHI 9/hr. CPAP intolerance >> smothering Good tolerance of auto device (optimal pressure 12-13 on download).  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   PVC (premature ventricular contraction) 06/23/2016   Seizures (HCC)    as a child (08/03/2016)   SOB (shortness of breath)    Termination of pregnancy    due to cardiac risk   Vitamin B12 deficiency    Vitamin D  deficiency    Past Surgical History:  Procedure Laterality Date  BREAST BIOPSY Right 09/30/2022   US  RT BREAST BX W LOC DEV 1ST LESION IMG BX SPEC US  GUIDE 09/30/2022 [facility]   CARDIAC CATHETERIZATION N/A 11/11/2015   Procedure: Right/Left Heart Cath and Coronary Angiography;  Surgeon: [name], MD;  Location: [facility];  Service: Cardiovascular;  Laterality: N/A;   CARDIAC CATHETERIZATION  ~ 2015   CARDIAC DEFIBRILLATOR PLACEMENT  08/03/2016   CESAREAN SECTION  1999   COLONOSCOPY WITH PROPOFOL  N/A 04/21/2016   Procedure: COLONOSCOPY WITH PROPOFOL ;  Surgeon: [name], MD;  Location: [facility];  Service: Gastroenterology;  Laterality: N/A;   ESOPHAGOGASTRODUODENOSCOPY (EGD) WITH PROPOFOL  N/A 04/21/2016   Procedure: ESOPHAGOGASTRODUODENOSCOPY (EGD) WITH PROPOFOL ;  Surgeon: [name], MD;  Location: [facility];  Service:  Gastroenterology;  Laterality: N/A;   FOOT FRACTURE SURGERY Right ~ 2003   FRACTURE SURGERY     ICD IMPLANT N/A 08/03/2016   Procedure: ICD Implant;  Surgeon: [name], MD;  Location: [facility] INVASIVE CV LAB;  Service: Cardiovascular;  Laterality: N/A;   IR ANGIOGRAM PELVIS SELECTIVE OR SUPRASELECTIVE  10/28/2022   IR ANGIOGRAM SELECTIVE EACH ADDITIONAL VESSEL  10/28/2022   IR ANGIOGRAM SELECTIVE EACH ADDITIONAL VESSEL  10/28/2022   IR ANGIOGRAM SELECTIVE EACH ADDITIONAL VESSEL  10/28/2022   IR EMBO TUMOR ORGAN ISCHEMIA INFARCT INC GUIDE ROADMAPPING  10/28/2022   IR US  GUIDE VASC ACCESS LEFT  10/28/2022   IR US  GUIDE VASC ACCESS RIGHT  10/28/2022   LAPAROSCOPIC CHOLECYSTECTOMY  12/2006   LAPAROSCOPIC GASTRIC SLEEVE RESECTION     LAPAROSCOPY ABDOMEN DIAGNOSTIC  2008   cut bile duct w/gallbladder OR; had to go in later & fix leak; hospitalized for 2 months   LEFT HEART CATHETERIZATION WITH CORONARY ANGIOGRAM N/A 02/26/2014   Procedure: LEFT HEART CATHETERIZATION WITH CORONARY ANGIOGRAM;  Surgeon: Candyce GORMAN Reek, MD;  Location: [facility] CATH LAB;  Service: Cardiovascular;  Laterality: N/A;   OVARIAN CYST REMOVAL Right 1999   PVC ABLATION N/A 12/01/2017   Procedure: PVC ABLATION;  Surgeon: Waddell Danelle ORN, MD;  Location: [facility];  Service: Cardiovascular;  Laterality: N/A;   RIGHT HEART CATH N/A 08/05/2017   Procedure: RIGHT HEART CATH;  Surgeon: [name], MD;  Location: [facility];  Service: Cardiovascular;  Laterality: N/A;   RIGHT HEART CATH N/A 11/16/2019   Procedure: RIGHT HEART CATH;  Surgeon: [name], MD;  Location: [facility];  Service: Cardiovascular;  Laterality: N/A;   TUBAL LIGATION  1999   Current Outpatient Medications on File Prior to Visit  Medication Sig Dispense Refill   acetaminophen  (TYLENOL ) 650 MG CR tablet Take 650 mg by mouth every 8 (eight) hours as needed for pain (headache).     albuterol  (VENTOLIN  HFA) 108 (90 Base) MCG/ACT  inhaler Inhale 1-2 puffs into the lungs every 6 (six) hours as needed for wheezing or shortness of breath. 18 g 0   budesonide -formoterol  (SYMBICORT ) 160-4.5 MCG/ACT inhaler Inhale 2 puffs into the lungs 2 (two) times daily as needed. 10 g 3   diclofenac  (VOLTAREN ) 75 MG EC tablet Take 1 tablet by mouth twice daily as needed 60 tablet 0   dicyclomine  (BENTYL ) 20 MG tablet Take 1 tablet (20 mg total) by mouth daily as needed for spasms. 90 tablet 3   fluticasone  (FLONASE ) 50 MCG/ACT nasal spray Place 2 sprays into both nostrils daily as needed for allergies. 16 g 3   gabapentin  (NEURONTIN ) 300 MG capsule Take 1  capsule (300 mg total) by mouth at bedtime as needed (back spasms). 90 capsule 3   ipratropium (ATROVENT ) 0.02 % nebulizer solution Inhale 500 mcg into the lungs.     ipratropium-albuterol  (DUONEB) 0.5-2.5 (3) MG/3ML SOLN USE 1 AMPULE IN NEBULIZER EVERY 6 HOURS AS NEEDED 360 mL 0   ivabradine  (CORLANOR ) 7.5 MG TABS tablet Take 1 tablet (7.5 mg total) by mouth 2 (two) times daily with a meal. 60 tablet 11   JARDIANCE  25 MG TABS tablet TAKE 1 TABLET BY MOUTH ONCE DAILY BEFORE BREAKFAST 90 tablet 0   Magnesium  Oxide 200 MG TABS Take 1 tablet (200 mg total) by mouth daily. 90 tablet 3   metolazone  (ZAROXOLYN ) 2.5 MG tablet TAKE 1 TABLET BY MOUTH AS DIRECTED 5 tablet 0   metoprolol  succinate (TOPROL -XL) 50 MG 24 hr tablet Take 1 tablet (50 mg total) by mouth daily. Take with or immediately following a meal. 90 tablet 3   nitroGLYCERIN  (NITROSTAT ) 0.4 MG SL tablet Place 1 tablet (0.4 mg total) under the tongue every 5 (five) minutes as needed for chest pain. 75 tablet 0   OVER THE COUNTER MEDICATION Take 1 capsule by mouth daily. BARIATRIC VITAMIN     potassium chloride  SA (KLOR-CON  M) 20 MEQ tablet Take 3 tablets (60 mEq total) by mouth 2 (two) times daily. 180 tablet 6   sacubitril -valsartan  (ENTRESTO ) 49-51 MG Take 1 tablet by mouth 2 (two) times daily. 60 tablet 6   spironolactone  (ALDACTONE ) 25  MG tablet Take 1 tablet (25 mg total) by mouth at bedtime. 90 tablet 3   tizanidine  (ZANAFLEX ) 2 MG capsule Take 2 mg by mouth as needed for muscle spasms.     torsemide  (DEMADEX ) 20 MG tablet Take 40 mg by mouth 2 (two) times daily.     Ubrogepant  (UBRELVY ) 100 MG TABS Take 1 tablet (100 mg total) by mouth daily as needed (migraine). 30 tablet 3   Vitamin D , Ergocalciferol , (DRISDOL ) 1.25 MG (50000 UNIT) CAPS capsule Take 1 capsule by mouth once a week 12 capsule 0   No current facility-administered medications on file prior to visit.   Allergies  Allergen Reactions   Vancomycin  Other (See Comments)    did something to my kidneys, PROGRESSED TO KIDNEY FAILURE!!   Aspirin  Other (See Comments)    Wheezing, (Pt states that she just wheezes some when she takes aspirin  by itself but she can take aspirin  in a combination product).   Contrast Media [Iodinated Contrast Media] Other (See Comments)    Multiple CT contrast studies done over 2 weeks caused ARF   Ciprofloxacin Itching and Rash   Cyclobenzaprine  Other (See Comments)    Can not tolerate   Farxiga  [Dapagliflozin] Rash   Sulfa Antibiotics Itching and Rash      PE Today's Vitals   02/23/24 1057  BP: 96/62  Pulse: 90  Temp: 97.9 F (36.6 C)  TempSrc: Oral  SpO2: 96%  Weight: 203 lb (92.1 kg)   Body mass index is 33.78 kg/m.  Physical Exam Vitals reviewed.  Constitutional:      General: She is not in acute distress.    Appearance: Normal appearance.  HENT:     Head: Normocephalic and atraumatic.     Nose: Nose normal.  Eyes:     Extraocular Movements: Extraocular movements intact.     Conjunctiva/sclera: Conjunctivae normal.  Pulmonary:     Effort: Pulmonary effort is normal.  Abdominal:     General: There is no distension.  Palpations: Abdomen is soft.     Tenderness: There is no abdominal tenderness. There is no guarding or rebound.   Musculoskeletal:        General: Normal range of motion.     Cervical  back: Normal range of motion.  Neurological:     General: No focal deficit present.     Mental Status: She is alert.  Psychiatric:        Mood and Affect: Mood normal.        Behavior: Behavior normal.      Assessment and Plan:        Left groin pain -     US  PELVIS (TRANSABDOMINAL ONLY); Future  Plan for imaging with US  to rule out hernia. Recommend every day stool softeners for constipation.   Megan LULLA Pa, MD

## 2024-02-22 NOTE — Telephone Encounter (Signed)
 Call to patient. Patient okay to see Dr. Dallie. Patient scheduled for 02/23/24 at 1030 with Dr. Dallie. ER precautions reviewed with patient and she verbalized understanding.   Encounter closed.   CC Dr. Dallie.

## 2024-02-22 NOTE — Telephone Encounter (Signed)
 Call to patient. Patient states that since 9/26 she has been experiencing pain in the left hip/bikini line area. The pain is now constant, and she states the area feels hard. Not red or warm to the touch. No relief with tylenol  or heat pack. Denies fever, chills or nausea/vomiting. Patient states she is out of work today. RN advised would need appointment for further evaluation. Patient agreeable. Advised would need to review schedule with Dr. Nikki and return call. Patient agreeable and okay to see another provider if needed.  Dr. Nikki- please advise if you would like patient placed on your schedule & where or if okay to schedule with Dr. Dallie who has availability tomorrow AM. Thanks.

## 2024-02-22 NOTE — Telephone Encounter (Signed)
 [name]  [facility] Triage57 minutes ago (9:43 AM)   CS No appointments available with [facility] please triage.     [name] [facility] Admin (supporting Bobie FORBES Cathlyn JAYSON Nikki, MD)2 hours ago (7:50 AM)    Appointment Request From: [name]   With Provider: Bobie DELENA Nikki [facility]]   Preferred Date Range: 02/22/2024 - 03/03/2024   Preferred Times: Any   Reason for visit: Office Visit   Health Maintenance Topic:    Comments: Pain in the lower left abdominal area since 9/26 first thought to be constipation discomfort. Pain when standing or while stretched completely out in bed. Very tender when pressed near the left hip/bikini line area. No relief with hot pack or Tylenol .

## 2024-02-22 NOTE — Progress Notes (Unsigned)
 [name] [address] [place] [address] Phone: [phone] Subjective:   [name], am serving as a scribe for Dr. Arthea [name].  I'[name] seeing this patient by the request  of:  [name] LABOR, MD  CC: Left knee pain  [name]  [name] is a 48 y.o. female coming in with complaint of L knee pain. Patient states that she injured herself playing pickleball. Pain over lateral aspect.   Xray L knee 01/18/2024  IMPRESSION: Mild tricompartmental osteoarthritis. Trace joint effusion.    Past Medical History:  Diagnosis Date   Acute on chronic systolic (congestive) heart failure (HCC) 04/29/2017   Acute pain of right shoulder 06/16/2017   ADHD    AICD (automatic cardioverter/defibrillator) present    Anginal pain    Anxiety    Arthritis    right shoulder    Asthma    Back pain    CHF (congestive heart failure) (HCC)    Chronic combined systolic and diastolic heart failure (HCC) 03/05/2014   Closed low lateral malleolus fracture 10/23/2013   Constipation    Cystitis 10/21/2017   Depression    Depression with anxiety 01/20/2013   Diabetes mellitus without complication (HCC)    Diverticulosis    Dyspnea    comes and goes intermittently mostly with exertion    Dysrhythmia    Edema, lower extremity    Essential hypertension    Prev followed by [facility]/ Cardiology    Fibroid    age 1   Gallstones    Generalized abdominal cramping    History of cardiomyopathy    Hypertension    IBS (irritable bowel syndrome)    ICD (implantable cardioverter-defibrillator), single, 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   Insomnia 05/07/2017   Joint pain    Labral tear of shoulder 04/04/2015    Injected 04/04/2015 Injected 12/03/2015    Migraine    monthly (08/03/2016)   Myofascial pain 06/16/2017   NICM (nonischemic cardiomyopathy) (HCC) 08/03/2016   Nonallopathic lesion of lumbosacral region 11/16/2016   Nonallopathic lesion of sacral  region 11/16/2016   Nonallopathic lesion of thoracic region 08/20/2014   Nonspecific chest pain 04/28/2017   Obesity    OSA (obstructive sleep apnea) 01/02/2013   NPSG 2009:  AHI 9/hr. CPAP intolerance >> smothering Good tolerance of auto device (optimal pressure 12-13 on download).  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   PVC (premature ventricular contraction) 06/23/2016   Seizures (HCC)    as a child (08/03/2016)   SOB (shortness of breath)    Termination of pregnancy    due to cardiac risk   Vitamin B12 deficiency    Vitamin D  deficiency    Past Surgical History:  Procedure Laterality Date   BREAST BIOPSY Right 09/30/2022   US  RT BREAST BX W LOC DEV 1ST LESION IMG BX SPEC US  GUIDE 09/30/2022 [facility]   CARDIAC CATHETERIZATION N/A 11/11/2015   Procedure: Right/Left Heart Cath and Coronary Angiography;  Surgeon: Debby LABOR Sor, MD;  Location: [facility];  Service: Cardiovascular;  Laterality: N/A;   CARDIAC CATHETERIZATION  ~ 2015   CARDIAC DEFIBRILLATOR PLACEMENT  08/03/2016   CESAREAN SECTION  1999   COLONOSCOPY WITH PROPOFOL  N/A 04/21/2016   Procedure:  COLONOSCOPY WITH PROPOFOL ;  Surgeon: [name], MD;  Location: [facility];  Service: Gastroenterology;  Laterality: N/A;   ESOPHAGOGASTRODUODENOSCOPY (EGD) WITH PROPOFOL  N/A 04/21/2016   Procedure: ESOPHAGOGASTRODUODENOSCOPY (EGD) WITH PROPOFOL ;  Surgeon: [name], MD;  Location: [facility];  Service: Gastroenterology;  Laterality: N/A;   FOOT FRACTURE SURGERY Right ~ 2003   FRACTURE SURGERY     ICD IMPLANT N/A 08/03/2016   Procedure: ICD Implant;  Surgeon: [name], MD;  Location: [facility] INVASIVE CV LAB;  Service: Cardiovascular;  Laterality: N/A;   IR ANGIOGRAM PELVIS SELECTIVE OR SUPRASELECTIVE  10/28/2022   IR ANGIOGRAM  SELECTIVE EACH ADDITIONAL VESSEL  10/28/2022   IR ANGIOGRAM SELECTIVE EACH ADDITIONAL VESSEL  10/28/2022   IR ANGIOGRAM SELECTIVE EACH ADDITIONAL VESSEL  10/28/2022   IR EMBO TUMOR ORGAN ISCHEMIA INFARCT INC GUIDE ROADMAPPING  10/28/2022   IR US  GUIDE VASC ACCESS LEFT  10/28/2022   IR US  GUIDE VASC ACCESS RIGHT  10/28/2022   LAPAROSCOPIC CHOLECYSTECTOMY  12/2006   LAPAROSCOPIC GASTRIC SLEEVE RESECTION     LAPAROSCOPY ABDOMEN DIAGNOSTIC  2008   cut bile duct w/gallbladder OR; had to go in later & fix leak; hospitalized for 2 months   LEFT HEART CATHETERIZATION WITH CORONARY ANGIOGRAM N/A 02/26/2014   Procedure: LEFT HEART CATHETERIZATION WITH CORONARY ANGIOGRAM;  Surgeon: Candyce GORMAN Reek, MD;  Location: [facility] CATH LAB;  Service: Cardiovascular;  Laterality: N/A;   OVARIAN CYST REMOVAL Right 1999   PVC ABLATION N/A 12/01/2017   Procedure: PVC ABLATION;  Surgeon: Waddell Danelle ORN, MD;  Location: [facility];  Service: Cardiovascular;  Laterality: N/A;   RIGHT HEART CATH N/A 08/05/2017   Procedure: RIGHT HEART CATH;  Surgeon: [name], MD;  Location: [facility];  Service: Cardiovascular;  Laterality: N/A;   RIGHT HEART CATH N/A 11/16/2019   Procedure: RIGHT HEART CATH;  Surgeon: [name], MD;  Location: [facility];  Service: Cardiovascular;  Laterality: N/A;   TUBAL LIGATION  1999   Social History   Socioeconomic History   Marital status: Married    Spouse name: [name]   Number of children: 1   Years of education: Not on file   Highest education level: Associate degree: academic program  Occupational History   Occupation: stay at home mom  Tobacco Use   Smoking status: Never    Passive exposure: Never   Smokeless tobacco: Never  Vaping Use   Vaping status: Never Used  Substance and Sexual Activity   Alcohol use: No   Drug use: No   Sexual activity: Yes    Birth control/protection: Surgical, Pill    Comment: BTL  Other Topics Concern   Not on file   Social History Narrative   Lives with husband, daughter niece and great nephew   Social Drivers of Corporate investment banker Strain: Low Risk  (06/08/2023)   Overall Financial Resource Strain (CARDIA)    Difficulty of Paying Living Expenses: Not very hard  Food Insecurity: No Food Insecurity (06/08/2023)   Hunger Vital Sign    Worried About Running Out of Food in the Last Year: Never true    Ran Out of Food in the Last Year: Never true  Transportation Needs: Unmet Transportation Needs (06/08/2023)   PRAPARE - Administrator, Civil Service (Medical): Yes    Lack of Transportation (Non-Medical): No  Physical Activity: Insufficiently Active (06/08/2023)   Exercise Vital Sign    Days of  Exercise per Week: 1 day    Minutes of Exercise per Session: 10 min  Stress: Stress Concern Present (06/08/2023)   [employer] of Occupational Health - Occupational Stress Questionnaire    Feeling of Stress : Very much  Social Connections: Socially Isolated (06/08/2023)   Social Connection and Isolation Panel    Frequency of Communication with Friends and Family: Never    Frequency of Social Gatherings with Friends and Family: Never    Attends Religious Services: Never    Database administrator or Organizations: No    Attends Engineer, structural: Never    Marital Status: Married   Allergies  Allergen Reactions   Vancomycin  Other (See Comments)    did something to my kidneys, PROGRESSED TO KIDNEY FAILURE!!   Aspirin  Other (See Comments)    Wheezing, (Pt states that she just wheezes some when she takes aspirin  by itself but she can take aspirin  in a combination product).   Contrast Media [Iodinated Contrast Media] Other (See Comments)    Multiple CT contrast studies done over 2 weeks caused ARF   Ciprofloxacin Itching and Rash   Cyclobenzaprine  Other (See Comments)    Can not tolerate   Farxiga  [Dapagliflozin] Rash   Sulfa Antibiotics Itching and Rash   Family History   Problem Relation Age of Onset   Rheum arthritis Mother    Obesity Mother    Emphysema Maternal Grandmother        smoked   Heart disease Maternal Grandmother 49       MI   Allergies Daughter    Colon cancer Neg Hx     Current Outpatient Medications (Endocrine & Metabolic):    JARDIANCE  25 MG TABS tablet, TAKE 1 TABLET BY MOUTH ONCE DAILY BEFORE BREAKFAST  Current Outpatient Medications (Cardiovascular):    ivabradine  (CORLANOR ) 7.5 MG TABS tablet, Take 1 tablet (7.5 mg total) by mouth 2 (two) times daily with a meal.   metolazone  (ZAROXOLYN ) 2.5 MG tablet, TAKE 1 TABLET BY MOUTH AS DIRECTED   metoprolol  succinate (TOPROL -XL) 50 MG 24 hr tablet, Take 1 tablet (50 mg total) by mouth daily. Take with or immediately following a meal.   nitroGLYCERIN  (NITROSTAT ) 0.4 MG SL tablet, Place 1 tablet (0.4 mg total) under the tongue every 5 (five) minutes as needed for chest pain.   sacubitril -valsartan  (ENTRESTO ) 49-51 MG, Take 1 tablet by mouth 2 (two) times daily.   spironolactone  (ALDACTONE ) 25 MG tablet, Take 1 tablet (25 mg total) by mouth at bedtime.   torsemide  (DEMADEX ) 20 MG tablet, Take 40 mg by mouth 2 (two) times daily.  Current Outpatient Medications (Respiratory):    albuterol  (VENTOLIN  HFA) 108 (90 Base) MCG/ACT inhaler, Inhale 1-2 puffs into the lungs every 6 (six) hours as needed for wheezing or shortness of breath.   budesonide -formoterol  (SYMBICORT ) 160-4.5 MCG/ACT inhaler, Inhale 2 puffs into the lungs 2 (two) times daily as needed.   fluticasone  (FLONASE ) 50 MCG/ACT nasal spray, Place 2 sprays into both nostrils daily as needed for allergies.   ipratropium (ATROVENT ) 0.02 % nebulizer solution, Inhale 500 mcg into the lungs.   ipratropium-albuterol  (DUONEB) 0.5-2.5 (3) MG/3ML SOLN, USE 1 AMPULE IN NEBULIZER EVERY 6 HOURS AS NEEDED  Current Outpatient Medications (Analgesics):    acetaminophen  (TYLENOL ) 650 MG CR tablet, Take 650 mg by mouth every 8 (eight) hours as needed  for pain (headache).   diclofenac  (VOLTAREN ) 75 MG EC tablet, Take 1 tablet by mouth twice daily as needed  Ubrogepant  (UBRELVY ) 100 MG TABS, Take 1 tablet (100 mg total) by mouth daily as needed (migraine).   Current Outpatient Medications (Other):    dicyclomine  (BENTYL ) 20 MG tablet, Take 1 tablet (20 mg total) by mouth daily as needed for spasms.   gabapentin  (NEURONTIN ) 300 MG capsule, Take 1 capsule (300 mg total) by mouth at bedtime as needed (back spasms).   Magnesium  Oxide 200 MG TABS, Take 1 tablet (200 mg total) by mouth daily.   OVER THE COUNTER MEDICATION, Take 1 capsule by mouth daily. BARIATRIC VITAMIN   potassium chloride  SA (KLOR-CON  [name]) 20 MEQ tablet, Take 3 tablets (60 mEq total) by mouth 2 (two) times daily.   tizanidine  (ZANAFLEX ) 2 MG capsule, Take 2 mg by mouth as needed for muscle spasms.   Vitamin D , Ergocalciferol , (DRISDOL ) 1.25 MG (50000 UNIT) CAPS capsule, Take 1 capsule by mouth once a week   Reviewed prior external information including notes and imaging from  primary care provider As well as notes that were available from care everywhere and other healthcare systems.  Past medical history, social, surgical and family history all reviewed in electronic medical record.  No pertanent information unless stated regarding to the chief complaint.   Review of Systems:  No headache, visual changes, nausea, vomiting, diarrhea, constipation, dizziness, abdominal pain, skin rash, fevers, chills, night sweats, weight loss, swollen lymph nodes, body aches, joint swelling, chest pain, shortness of breath, mood changes. POSITIVE muscle aches  Objective  Blood pressure 108/68, pulse 88, height 5' 5 (1.651 [name]), SpO2 98%.   General: No apparent distress alert and oriented x3 mood and affect normal, dressed appropriately.  HEENT: Pupils equal, extraocular movements intact  Respiratory: [name] speak in full sentences and does not appear short of breath  Cardiovascular: No  lower extremity edema, non tender, no erythema  Left knee exam shows continues to have lateral tracking noted of the knee.  Seems to be more of the patella.  Positive patellar grind test noted.  Trace effusion noted.   After informed written and verbal consent, patient was seated on exam table. Left knee was prepped with alcohol swab and utilizing anterolateral approach, patient's left knee space was injected with 4:1  marcaine  0.5%: Kenalog  40mg /dL. Patient tolerated the procedure well without immediate complications.   Impression and Recommendations:     The above documentation has been reviewed and is accurate and complete [name] Sanvika Cuttino, DO

## 2024-02-23 ENCOUNTER — Ambulatory Visit (HOSPITAL_COMMUNITY)
Admission: RE | Admit: 2024-02-23 | Discharge: 2024-02-23 | Disposition: A | Discharge status: Home / Self Care | Source: Ambulatory Visit | Attending: Family Medicine | Admitting: Family Medicine

## 2024-02-23 ENCOUNTER — Ambulatory Visit: Admitting: Family Medicine

## 2024-02-23 ENCOUNTER — Telehealth: Payer: Self-pay

## 2024-02-23 ENCOUNTER — Encounter: Payer: Self-pay | Admitting: Obstetrics and Gynecology

## 2024-02-23 ENCOUNTER — Ambulatory Visit: Admitting: Obstetrics and Gynecology

## 2024-02-23 ENCOUNTER — Telehealth (HOSPITAL_COMMUNITY): Payer: Self-pay

## 2024-02-23 ENCOUNTER — Encounter: Payer: Self-pay | Admitting: Family Medicine

## 2024-02-23 ENCOUNTER — Encounter (HOSPITAL_COMMUNITY): Payer: Self-pay

## 2024-02-23 ENCOUNTER — Encounter (HOSPITAL_COMMUNITY): Admission: RE | Admit: 2024-02-23 | Source: Ambulatory Visit

## 2024-02-23 VITALS — BP 96/62 | HR 90 | Temp 97.9°F | Wt 203.0 lb

## 2024-02-23 VITALS — BP 108/86 | HR 86 | Wt 206.4 lb

## 2024-02-23 VITALS — BP 108/68 | HR 88 | Ht 65.0 in

## 2024-02-23 DIAGNOSIS — Z86718 Personal history of other venous thrombosis and embolism: Secondary | ICD-10-CM | POA: Insufficient documentation

## 2024-02-23 DIAGNOSIS — I493 Ventricular premature depolarization: Secondary | ICD-10-CM | POA: Diagnosis not present

## 2024-02-23 DIAGNOSIS — Z9884 Bariatric surgery status: Secondary | ICD-10-CM | POA: Insufficient documentation

## 2024-02-23 DIAGNOSIS — I110 Hypertensive heart disease with heart failure: Secondary | ICD-10-CM | POA: Diagnosis not present

## 2024-02-23 DIAGNOSIS — R5383 Other fatigue: Secondary | ICD-10-CM | POA: Insufficient documentation

## 2024-02-23 DIAGNOSIS — I428 Other cardiomyopathies: Secondary | ICD-10-CM | POA: Diagnosis not present

## 2024-02-23 DIAGNOSIS — E119 Type 2 diabetes mellitus without complications: Secondary | ICD-10-CM | POA: Insufficient documentation

## 2024-02-23 DIAGNOSIS — Z79899 Other long term (current) drug therapy: Secondary | ICD-10-CM | POA: Diagnosis not present

## 2024-02-23 DIAGNOSIS — I5022 Chronic systolic (congestive) heart failure: Secondary | ICD-10-CM | POA: Insufficient documentation

## 2024-02-23 DIAGNOSIS — R1032 Left lower quadrant pain: Secondary | ICD-10-CM | POA: Diagnosis not present

## 2024-02-23 DIAGNOSIS — Z95810 Presence of automatic (implantable) cardiac defibrillator: Secondary | ICD-10-CM | POA: Diagnosis not present

## 2024-02-23 DIAGNOSIS — E669 Obesity, unspecified: Secondary | ICD-10-CM | POA: Diagnosis not present

## 2024-02-23 DIAGNOSIS — Z7984 Long term (current) use of oral hypoglycemic drugs: Secondary | ICD-10-CM | POA: Diagnosis not present

## 2024-02-23 DIAGNOSIS — Z6834 Body mass index (BMI) 34.0-34.9, adult: Secondary | ICD-10-CM | POA: Insufficient documentation

## 2024-02-23 DIAGNOSIS — S83002A Unspecified subluxation of left patella, initial encounter: Secondary | ICD-10-CM

## 2024-02-23 LAB — IRON AND TIBC
Iron: 44 ug/dL (ref 28–170)
Saturation Ratios: 12 % (ref 10.4–31.8)
TIBC: 372 ug/dL (ref 250–450)
UIBC: 328 ug/dL

## 2024-02-23 LAB — CBC
HCT: 44.1 % (ref 36.0–46.0)
Hemoglobin: 14.6 g/dL (ref 12.0–15.0)
MCH: 29.4 pg (ref 26.0–34.0)
MCHC: 33.1 g/dL (ref 30.0–36.0)
MCV: 88.7 fL (ref 80.0–100.0)
Platelets: 278 K/uL (ref 150–400)
RBC: 4.97 MIL/uL (ref 3.87–5.11)
RDW: 14.4 % (ref 11.5–15.5)
WBC: 7.9 K/uL (ref 4.0–10.5)
nRBC: 0 % (ref 0.0–0.2)

## 2024-02-23 LAB — BASIC METABOLIC PANEL WITH GFR
Anion gap: 12 (ref 5–15)
BUN: 15 mg/dL (ref 6–20)
CO2: 26 mmol/L (ref 22–32)
Calcium: 9.8 mg/dL (ref 8.9–10.3)
Chloride: 97 mmol/L — ABNORMAL LOW (ref 98–111)
Creatinine, Ser: 1.16 mg/dL — ABNORMAL HIGH (ref 0.44–1.00)
GFR, Estimated: 58 mL/min — ABNORMAL LOW (ref >60)
Glucose, Bld: 98 mg/dL (ref 70–99)
Potassium: 4.1 mmol/L (ref 3.5–5.1)
Sodium: 135 mmol/L (ref 135–145)

## 2024-02-23 LAB — BRAIN NATRIURETIC PEPTIDE: B Natriuretic Peptide: 66.1 pg/mL (ref 0.0–100.0)

## 2024-02-23 LAB — FERRITIN: Ferritin: 68 ng/mL (ref 11–307)

## 2024-02-23 MED ORDER — SACUBITRIL-VALSARTAN 24-26 MG PO TABS: 1.0000 | ORAL_TABLET | Freq: Two times a day (BID) | ORAL | 2 refills | Status: DC

## 2024-02-23 NOTE — Telephone Encounter (Signed)
 Patient ran for Monovisc for left knee. Case [id number]. Pending approval.

## 2024-02-23 NOTE — Telephone Encounter (Signed)
 Patient left message calling out for 12:30 CR class, she has multiple dr appts today.

## 2024-02-23 NOTE — Patient Instructions (Addendum)
 Good to see you today!   DECREASE Entresto   to 24/26 mg Twice Twice daily  Labs done today, your results will be available in MyChart, we will contact you for abnormal readings.  Your physician recommends that you schedule a follow-up appointment as  scheduled  If you have any questions or concerns before your next appointment please send us  a message through [place] or call our office at [phone].    TO LEAVE A MESSAGE FOR THE NURSE SELECT OPTION 2, PLEASE LEAVE A MESSAGE INCLUDING: YOUR NAME DATE OF BIRTH CALL BACK NUMBER REASON FOR CALL**this is important as we prioritize the call backs  YOU WILL RECEIVE A CALL BACK THE SAME DAY AS LONG AS YOU CALL BEFORE 4:00 PM At the [facility], you and your health needs are our priority. As part of our continuing mission to provide you with exceptional heart care, we have created designated Provider Care Teams. These Care Teams include your primary Cardiologist (physician) and Advanced Practice Providers (APPs- Physician Assistants and Nurse Practitioners) who all work together to provide you with the care you need, when you need it.   You may see any of the following providers on your designated Care Team at your next follow up: Dr [name] Dr Ezra Shuck Dr. Ria Commander Dr. Morene Brownie Amy Lenetta, NP Caffie Shed, [place] [facility], [place] Beckey Coe, NP [place] Lee, NP Ellouise Class, NP Tinnie Redman, PharmD Jaun Bash, PharmD   Please be sure to bring in all your medications bottles to every appointment.    Thank you for choosing [facility]

## 2024-02-23 NOTE — Assessment & Plan Note (Addendum)
 Attempted injection today secondary to the recurrence feeling today.  Encourage patient to wear the brace on a more regular basis at this time.  Discussed icing regimen and home exercises, discussed which activities to do and which ones to avoid.  Increase activity slowly again but wear the Tru pull lite brace on a more regular basis initially.  Follow-up with me again in 6 to 8 weeks otherwise I do believe that worsening pain or no improvement do need to consider the advancement MRI imaging.  Patient does have an ICD and we need to do it at the hospital.

## 2024-02-23 NOTE — Telephone Encounter (Signed)
-----   Message from Berwyn Posey sent at 02/23/2024  8:23 AM EDT ----- Regarding: visco Can you please run patient for visco for L knee?  Thank you

## 2024-02-23 NOTE — Patient Instructions (Addendum)
 Injected L knee today Write us  in 2 weeks if not better will get MRI Will get gel approved See me again in 2 months

## 2024-02-23 NOTE — Addendum Note (Signed)
 Encounter addended by: Glena Harlene HERO, FNP on: 02/23/2024 4:19 PM  Actions taken: Clinical Note Signed

## 2024-02-23 NOTE — Progress Notes (Signed)
 Remote ICD Transmission

## 2024-02-23 NOTE — Patient Instructions (Signed)
 Proceed to ED with severe pain, dizziness, or vomiting.

## 2024-02-24 ENCOUNTER — Ambulatory Visit
Admission: RE | Admit: 2024-02-24 | Discharge: 2024-02-24 | Disposition: A | Discharge status: Home / Self Care | Source: Ambulatory Visit | Attending: Obstetrics and Gynecology | Admitting: Obstetrics and Gynecology

## 2024-02-24 ENCOUNTER — Telehealth: Payer: Self-pay | Admitting: *Deleted

## 2024-02-24 DIAGNOSIS — R1032 Left lower quadrant pain: Secondary | ICD-10-CM

## 2024-02-24 DIAGNOSIS — R1030 Lower abdominal pain, unspecified: Secondary | ICD-10-CM | POA: Diagnosis not present

## 2024-02-24 NOTE — Telephone Encounter (Signed)
 Scheduled 12.1

## 2024-02-24 NOTE — Telephone Encounter (Signed)
-----   Message from Megan LULLA Pa sent at 02/23/2024 11:24 AM EDT ----- Please contact GI to ensure pelvic US  is the correct study to rule out inguinal or femoral hernia and assist with scheduling.

## 2024-02-24 NOTE — Telephone Encounter (Signed)
 Patient needs an appointment once medication is stocked.   Monovisc approved for left knee.  Deductible does not apply. Once the OOP has been met, patient is covered 100%. Only one copay applies per date of service. Prior authorization is not required.  Ref# [id number] Exp: 08/23/2024

## 2024-02-24 NOTE — Telephone Encounter (Signed)
 Addressed in other telephone notes from 01/26/24.

## 2024-02-24 NOTE — Telephone Encounter (Signed)
 Call placed to [facility] at [phone], spoke with [facility], requested to speak with triage. Message left with triage to call [facility] at [phone], opt 4, no patient information provided.

## 2024-02-25 ENCOUNTER — Ambulatory Visit: Payer: Self-pay | Admitting: Obstetrics and Gynecology

## 2024-02-25 ENCOUNTER — Ambulatory Visit (HOSPITAL_COMMUNITY): Payer: Self-pay | Admitting: Family Medicine

## 2024-02-25 ENCOUNTER — Encounter (HOSPITAL_COMMUNITY)
Admission: RE | Admit: 2024-02-25 | Discharge: 2024-02-25 | Disposition: A | Discharge status: Home / Self Care | Source: Ambulatory Visit | Attending: Internal Medicine | Admitting: Internal Medicine

## 2024-02-25 ENCOUNTER — Ambulatory Visit (INDEPENDENT_AMBULATORY_CARE_PROVIDER_SITE_OTHER)

## 2024-02-25 VITALS — Ht 65.0 in | Wt 201.8 lb

## 2024-02-25 DIAGNOSIS — Z0000 Encounter for general adult medical examination without abnormal findings: Secondary | ICD-10-CM

## 2024-02-25 DIAGNOSIS — I5042 Chronic combined systolic (congestive) and diastolic (congestive) heart failure: Secondary | ICD-10-CM | POA: Insufficient documentation

## 2024-02-25 NOTE — Progress Notes (Signed)
 Subjective:  Please attest and cosign this visit due to patients primary care provider not being in the office at the time the visit was completed.  (Pt of Dr Almarie Cleveland)   [name] is a 48 y.o. who presents for a Medicare Wellness preventive visit.  As a reminder, Annual Wellness Visits don't include a physical exam, and some assessments may be limited, especially if this visit is performed virtually. We may recommend an in-person follow-up visit with your provider if needed.  Visit Complete: Virtual I connected with  [name] on 02/25/24 by a audio enabled telemedicine application and verified that I am speaking with the correct person using two identifiers.  Patient Location: Home  Provider Location: Office/Clinic  I discussed the limitations of evaluation and management by telemedicine. The patient expressed understanding and agreed to proceed.  Vital Signs: Because this visit was a virtual/telehealth visit, some criteria may be missing or patient reported. Any vitals not documented were not able to be obtained and vitals that have been documented are patient reported.  VideoDeclined- This patient declined Librarian, academic. Therefore the visit was completed with audio only.  Persons Participating in Visit: Patient.  AWV Questionnaire: Yes: Patient Medicare AWV questionnaire was completed by the patient on 02/24/2024; I have confirmed that all information answered by patient is correct and no changes since this date.  Cardiac Risk Factors include: advanced age (>30men, >64 women);hypertension;obesity (BMI >30kg/m2)     Objective:    Today's Vitals   02/25/24 1513  Weight: 201 lb 12.8 oz (91.5 kg)  Height: 5' 5 (1.651 m)   Body mass index is 33.58 kg/m.     02/25/2024    3:26 PM 05/12/2023   10:06 PM 05/06/2023    1:54 PM 10/29/2022   12:54 PM 10/28/2022    7:52 AM 10/23/2022   10:45 PM 04/28/2022    2:42 AM  Advanced  Directives  Does Patient Have a Medical Advance Directive? Yes No No  No No No  Type of Estate agent of [place];Living will        Copy of Healthcare Power of Attorney in Chart? No - copy requested        Would patient like information on creating a medical advance directive?  No - Patient declined  No - Patient declined No - Patient declined No - Patient declined No - Patient declined    Current Medications (verified) Outpatient Encounter Medications as of 02/25/2024  Medication Sig   acetaminophen  (TYLENOL ) 650 MG CR tablet Take 650 mg by mouth every 8 (eight) hours as needed for pain (headache).   albuterol  (VENTOLIN  HFA) 108 (90 Base) MCG/ACT inhaler Inhale 1-2 puffs into the lungs every 6 (six) hours as needed for wheezing or shortness of breath.   budesonide -formoterol  (SYMBICORT ) 160-4.5 MCG/ACT inhaler Inhale 2 puffs into the lungs 2 (two) times daily as needed.   diclofenac  (VOLTAREN ) 75 MG EC tablet Take 1 tablet by mouth twice daily as needed   dicyclomine  (BENTYL ) 20 MG tablet Take 1 tablet (20 mg total) by mouth daily as needed for spasms.   fluticasone  (FLONASE ) 50 MCG/ACT nasal spray Place 2 sprays into both nostrils daily as needed for allergies.   gabapentin  (NEURONTIN ) 300 MG capsule Take 1 capsule (300 mg total) by mouth at bedtime as needed (back spasms).   ipratropium (ATROVENT ) 0.02 % nebulizer solution Inhale 500 mcg into the lungs.   ipratropium-albuterol  (DUONEB) 0.5-2.5 (3) MG/3ML SOLN  USE 1 AMPULE IN NEBULIZER EVERY 6 HOURS AS NEEDED   ivabradine  (CORLANOR ) 7.5 MG TABS tablet Take 1 tablet (7.5 mg total) by mouth 2 (two) times daily with a meal.   JARDIANCE  25 MG TABS tablet TAKE 1 TABLET BY MOUTH ONCE DAILY BEFORE BREAKFAST   Magnesium  Oxide 200 MG TABS Take 1 tablet (200 mg total) by mouth daily.   metolazone  (ZAROXOLYN ) 2.5 MG tablet TAKE 1 TABLET BY MOUTH AS DIRECTED   metoprolol  succinate (TOPROL -XL) 50 MG 24 hr tablet Take 1 tablet (50 mg  total) by mouth daily. Take with or immediately following a meal.   nitroGLYCERIN  (NITROSTAT ) 0.4 MG SL tablet Place 1 tablet (0.4 mg total) under the tongue every 5 (five) minutes as needed for chest pain.   OVER THE COUNTER MEDICATION Take 1 capsule by mouth daily. BARIATRIC VITAMIN   potassium chloride  SA (KLOR-CON  M) 20 MEQ tablet Take 3 tablets (60 mEq total) by mouth 2 (two) times daily.   sacubitril -valsartan  (ENTRESTO ) 24-26 MG Take 1 tablet by mouth 2 (two) times daily.   spironolactone  (ALDACTONE ) 25 MG tablet Take 1 tablet (25 mg total) by mouth at bedtime.   tizanidine  (ZANAFLEX ) 2 MG capsule Take 2 mg by mouth as needed for muscle spasms.   torsemide  (DEMADEX ) 20 MG tablet Take 40 mg by mouth 2 (two) times daily.   Ubrogepant  (UBRELVY ) 100 MG TABS Take 1 tablet (100 mg total) by mouth daily as needed (migraine).   Vitamin D , Ergocalciferol , (DRISDOL ) 1.25 MG (50000 UNIT) CAPS capsule Take 1 capsule by mouth once a week   No facility-administered encounter medications on file as of 02/25/2024.    Allergies (verified) Vancomycin , Aspirin , Contrast media [iodinated contrast media], Ciprofloxacin, Cyclobenzaprine , Farxiga  [dapagliflozin], and Sulfa antibiotics   History: Past Medical History:  Diagnosis Date   Acute on chronic systolic (congestive) heart failure (HCC) 04/29/2017   Acute pain of right shoulder 06/16/2017   ADHD    AICD (automatic cardioverter/defibrillator) present    Anginal pain    Anxiety    Arthritis    right shoulder    Asthma    Back pain    CHF (congestive heart failure) (HCC)    Chronic combined systolic and diastolic heart failure (HCC) 03/05/2014   Closed low lateral malleolus fracture 10/23/2013   Constipation    Cystitis 10/21/2017   Depression    Depression with anxiety 01/20/2013   Diabetes mellitus without complication (HCC)    Diverticulosis    Dyspnea    comes and goes intermittently mostly with exertion    Dysrhythmia    Edema, lower  extremity    Essential hypertension    Prev followed by [name]    Fibroid    age 51   Gallstones    Generalized abdominal cramping    History of cardiomyopathy    Hypertension    IBS (irritable bowel syndrome)    ICD (implantable cardioverter-defibrillator), single, 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  CPAP intolerance >> smothering Good tolerance of auto device (optimal pressure 12-13 on download).  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   PVC (premature ventricular contraction) 06/23/2016   Seizures (HCC)    as a child (08/03/2016)   SOB (shortness of breath)    Termination of pregnancy    due to cardiac risk   Vitamin B12 deficiency    Vitamin D  deficiency    Past Surgical History:  Procedure Laterality Date   BREAST BIOPSY Right 09/30/2022   US  RT BREAST BX W LOC DEV 1ST LESION IMG BX SPEC US  GUIDE 09/30/2022 [facility]   CARDIAC CATHETERIZATION N/A 11/11/2015   Procedure: Right/Left Heart Cath and Coronary Angiography;  Surgeon: [name], MD;  Location: [facility];  Service: Cardiovascular;  Laterality: N/A;   CARDIAC CATHETERIZATION  ~ 2015   CARDIAC DEFIBRILLATOR PLACEMENT  08/03/2016   CESAREAN SECTION  1999   COLONOSCOPY WITH PROPOFOL  N/A 04/21/2016   Procedure:  COLONOSCOPY WITH PROPOFOL ;  Surgeon: [name], MD;  Location: [facility];  Service: Gastroenterology;  Laterality: N/A;   ESOPHAGOGASTRODUODENOSCOPY (EGD) WITH PROPOFOL  N/A 04/21/2016   Procedure: ESOPHAGOGASTRODUODENOSCOPY (EGD) WITH PROPOFOL ;  Surgeon: [name], MD;  Location: [facility];  Service: Gastroenterology;  Laterality: N/A;   FOOT FRACTURE SURGERY Right ~ 2003   FRACTURE SURGERY     ICD IMPLANT N/A 08/03/2016   Procedure: ICD Implant;  Surgeon: [name], MD;  Location[facility]VASIVE CV LAB;  Service: Cardiovascular;  Laterality: N/A;   IR ANGIOGRAM PELVIS SELECTIVE OR SUPRASELECTIVE  10/28/2022   IR ANGIOGRAM SELECTIVE EACH ADDITIONAL VESSEL  10/28/2022   IR ANGIOGRAM SELECTIVE EACH ADDITIONAL VESSEL  10/28/2022   IR ANGIOGRAM SELECTIVE EACH ADDITIONAL VESSEL  10/28/2022   IR EMBO TUMOR ORGAN ISCHEMIA INFARCT INC GUIDE ROADMAPPING  10/28/2022   IR US  GUIDE VASC ACCESS LEFT  10/28/2022   IR US  GUIDE VASC ACCESS RIGHT  10/28/2022   LAPAROSCOPIC CHOLECYSTECTOMY  12/2006   LAPAROSCOPIC GASTRIC SLEEVE RESECTION     LAPAROSCOPY ABDOMEN DIAGNOSTIC  2008   cut bile duct w/gallbladder OR; had to go in later & fix leak; hospitalized for 2 months   LEFT HEART CATHETERIZATION WITH CORONARY ANGIOGRAM N/A 02/26/2014   Procedure: LEFT HEART CATHETERIZATION WITH CORONARY ANGIOGRAM;  Surgeon: Candyce GORMAN Reek, MD;  Location: [facility] CATH LAB;  Service: Cardiovascular;  Laterality: N/A;   OVARIAN CYST REMOVAL Right 1999   PVC ABLATION N/A 12/01/2017   Procedure: PVC ABLATION;  Surgeon: Waddell Danelle ORN, MD;  Location: [facility];  Service: Cardiovascular;  Laterality: N/A;   RIGHT HEART CATH N/A 08/05/2017   Procedure: RIGHT HEART CATH;  Surgeon: [name], MD;  Location: [facility];  Service: Cardiovascular;  Laterality: N/A;   RIGHT HEART CATH N/A 11/16/2019   Procedure: RIGHT HEART CATH;  Surgeon: [name], MD;  Location: [facility];  Service:  Cardiovascular;  Laterality: N/A;   TUBAL LIGATION  1999   Family History  Problem Relation Age of Onset   Rheum arthritis Mother    Obesity Mother    Emphysema Maternal Grandmother        smoked   Heart disease Maternal Grandmother 63  MI   Allergies Daughter    Colon cancer Neg Hx    Social History   Socioeconomic History   Marital status: Married    Spouse name: Beryl   Number of children: 1   Years of education: Not on file   Highest education level: Associate degree: academic program  Occupational History   Occupation: stay at home mom  Tobacco Use   Smoking status: Never    Passive exposure: Never   Smokeless tobacco: Never  Vaping Use   Vaping status: Never Used  Substance and Sexual Activity   Alcohol use: No   Drug use: No   Sexual activity: Yes    Birth control/protection: Surgical    Comment: BTL, Ablation  Other Topics Concern   Not on file  Social History Narrative   Lives with husband, daughter niece and great nephew   Social Drivers of Corporate investment banker Strain: Low Risk  (02/25/2024)   Overall Financial Resource Strain (CARDIA)    Difficulty of Paying Living Expenses: Not very hard  Food Insecurity: No Food Insecurity (02/25/2024)   Hunger Vital Sign    Worried About Running Out of Food in the Last Year: Never true    Ran Out of Food in the Last Year: Never true  Transportation Needs: No Transportation Needs (02/25/2024)   PRAPARE - Administrator, Civil Service (Medical): No    Lack of Transportation (Non-Medical): No  Physical Activity: Sufficiently Active (02/25/2024)   Exercise Vital Sign    Days of Exercise per Week: 5 days    Minutes of Exercise per Session: 90 min  Stress: Stress Concern Present (02/25/2024)   [employer] of Occupational Health - Occupational Stress Questionnaire    Feeling of Stress: Very much  Social Connections: Socially Isolated (02/25/2024)   Social Connection and Isolation Panel     Frequency of Communication with Friends and Family: Never    Frequency of Social Gatherings with Friends and Family: Never    Attends Religious Services: Never    Database administrator or Organizations: No    Attends Engineer, structural: Never    Marital Status: Married    Tobacco Counseling Counseling given: Not Answered    Clinical Intake:  Pre-visit preparation completed: Yes  Pain : No/denies pain     BMI - recorded: 33.58 Nutritional Status: BMI > 30  Obese Nutritional Risks: None Diabetes: No  Lab Results  Component Value Date   HGBA1C 5.2 07/16/2023   HGBA1C 5.5 06/11/2023   HGBA1C 5.5 08/26/2020     How often do you need to have someone help you when you read instructions, pamphlets, or other written materials from your doctor or pharmacy?: 1 - Never  Interpreter Needed?: No  Information entered by :: Verdie Saba, CMA   Activities of Daily Living     02/25/2024    3:17 PM 02/24/2024   11:30 PM  In your present state of health, do you have any difficulty performing the following activities:  Hearing? 0 0  Vision? 0 0  Difficulty concentrating or making decisions? 1 1  Comment Spouse helps   Walking or climbing stairs? 0 0  Dressing or bathing? 0 0  Doing errands, shopping? 0 0  Preparing Food and eating ? N N  Using the Toilet? N N  In the past six months, have you accidently leaked urine? N N  Do you have problems with loss of bowel control? N N  Managing your Medications? N N  Managing your Finances? N N  Housekeeping or managing your Housekeeping? CINDERELLA CINDERELLA    Patient Care Team: Rollene Almarie LABOR, MD as PCP - General (Internal Medicine) Bensimhon, Toribio SAUNDERS, MD as PCP - Advanced Heart Failure (Cardiology) Claudene Victory ORN, MD (Inactive) as PCP - Cardiology (Cardiology) Waddell Danelle ORN, MD as PCP - Electrophysiology (Cardiology) Darlean Ozell NOVAK, MD (Pulmonary Disease) Vincente Grip, MD (Psychiatry) Pietro Redell RAMAN, MD  (Cardiology) Jude Harden GAILS, MD (Pulmonary Disease) Fate Morna SAILOR, [facility] (Inactive) as Pharmacist (Pharmacist) Cathlyn JAYSON Nikki Bobie FORBES, MD as Consulting Physician (Obstetrics and Gynecology)  I have updated your Care Teams any recent Medical Services you may have received from other providers in the past year.     Assessment:   This is a routine wellness examination for Rivers.  Hearing/Vision screen Hearing Screening - Comments:: Denies hearing difficulties   Vision Screening - Comments:: Wears rx glasses and contact lenses - up to date with routine eye exams with [facility]   Goals Addressed               This Visit's Progress     Patient Stated (pt-stated)        Patient stated she plans to continue to exercise and watching diet       Depression Screen     02/25/2024    3:18 PM 01/03/2024   12:21 PM 01/03/2024    8:07 AM 05/06/2023    1:55 PM 12/25/2022    9:09 AM 08/18/2022    2:06 PM 05/04/2022    1:54 PM  PHQ 2/9 Scores  PHQ - 2 Score 0 4 4 3 5 6  0  PHQ- 9 Score 0 19 17 12 19 22  0    Fall Risk     02/25/2024    3:17 PM 02/25/2024    1:03 PM 02/24/2024   11:30 PM 02/18/2024   12:49 PM 02/16/2024   12:00 PM  Fall Risk   Falls in the past year? 1 1 1 1 1   Number falls in past yr: 1 1 0 1 1  Comment 2      Injury with Fall? 0 0 0 0 0  Risk for fall due to :  History of fall(s)  History of fall(s) History of fall(s)  Follow up Falls evaluation completed;Falls prevention discussed Falls evaluation completed  Falls evaluation completed Falls evaluation completed    MEDICARE RISK AT HOME:  Medicare Risk at Home Any stairs in or around the home?: Yes If so, are there any without handrails?: Yes Home free of loose throw rugs in walkways, pet beds, electrical cords, etc?: Yes Adequate lighting in your home to reduce risk of falls?: Yes Life alert?: Yes Use of a cane, walker or w/c?: No Grab bars in the bathroom?: Yes Shower chair or bench in shower?:  No Elevated toilet seat or a handicapped toilet?: No  TIMED UP AND GO:  Was the test performed?  No  Cognitive Function: 6CIT completed        02/25/2024    3:19 PM 05/06/2023    1:58 PM  6CIT Screen  What Year? 0 points 0 points  What month? 0 points 0 points  What time? 0 points 0 points  Count back from 20 0 points 0 points  Months in reverse 0 points 0 points  Repeat phrase 0 points 0 points  Total Score 0 points 0 points    Immunizations Immunization  History  Administered Date(s) Administered   Influenza Split 03/05/2013   Influenza,inj,Quad PF,6+ Mos 01/17/2014, 01/30/2015   PFIZER Comirnaty(Gray Top)Covid-19 Tri-Sucrose Vaccine 08/27/2020   PFIZER(Purple Top)SARS-COV-2 Vaccination 09/24/2020   Pneumococcal Polysaccharide-23 02/22/2014   Tdap 02/15/2013, 08/15/2023    Screening Tests Health Maintenance  Topic Date Due   Diabetic kidney evaluation - Urine ACR  Never done   Hepatitis C Screening  Never done   Hepatitis B Vaccines 19-59 Average Risk (1 of 3 - 19+ 3-dose series) Never done   Pneumococcal Vaccine (2 of 2 - PCV) 02/23/2015   OPHTHALMOLOGY EXAM  12/01/2017   FOOT EXAM  06/11/2018   COVID-19 Vaccine (3 - Pfizer risk series) 10/22/2020   Influenza Vaccine  12/24/2023   HEMOGLOBIN A1C  01/13/2024   Diabetic kidney evaluation - eGFR measurement  02/22/2025   Medicare Annual Wellness (AWV)  02/24/2025   Cervical Cancer Screening (HPV/Pap Cotest)  05/06/2025   Mammogram  11/03/2025   Colonoscopy  04/21/2026   DTaP/Tdap/Td (3 - Td or Tdap) 08/14/2033   HIV Screening  Completed   HPV VACCINES  Aged Out   Meningococcal B Vaccine  Aged Out    Health Maintenance Items Addressed:  I have recommended that this patient have a immunization for Influenza and Pneumonia but she declines at this time. I have discussed the risks and benefits of this procedure with her. The patient verbalizes understanding.   Additional Screening:  Vision Screening:  Recommended annual ophthalmology exams for early detection of glaucoma and other disorders of the eye. Is the patient up to date with their annual eye exam?  Yes  Who is the provider or what is the name of the office in which the patient attends annual eye exams? [facility]  Dental Screening: Recommended annual dental exams for proper oral hygiene  Community Resource Referral / Chronic Care Management: CRR required this visit?  No   CCM required this visit?  No   Plan:    I have personally reviewed and noted the following in the patient's chart:   Medical and social history Use of alcohol, tobacco or illicit drugs  Current medications and supplements including opioid prescriptions. Patient is not currently taking opioid prescriptions. Functional ability and status Nutritional status Physical activity Advanced directives List of other physicians Hospitalizations, surgeries, and ER visits in previous 12 months Vitals Screenings to include cognitive, depression, and falls Referrals and appointments  In addition, I have reviewed and discussed with patient certain preventive protocols, quality metrics, and best practice recommendations. A written personalized care plan for preventive services as well as general preventive health recommendations were provided to patient.   Verdie CHRISTELLA Saba, CMA   02/25/2024   After Visit Summary: (MyChart) Due to this being a telephonic visit, the after visit summary with patients personalized plan was offered to patient via MyChart   Notes: Scheduled a Physical appt w/PCP for 05/2024

## 2024-02-25 NOTE — Telephone Encounter (Signed)
 No return call from GI to date.   I called [facility] Reading room, spoke with Dr. Ramond. Advised of request below per Dr. Dallie.   Limited pelvic ultrasound is recommended. Add in comments assess groin for inguinal or femoral hernia.   Dr. Dallie -please review and advise if ok to proceed with scheduling as recommended from radiology?

## 2024-02-25 NOTE — Patient Instructions (Addendum)
 [name],  Thank you for taking the time for your Medicare Wellness Visit. I appreciate your continued commitment to your health goals. Please review the care plan we discussed, and feel free to reach out if I can assist you further.  Medicare recommends these wellness visits once per year to help you and your care team stay ahead of potential health issues. These visits are designed to focus on prevention, allowing your provider to concentrate on managing your acute and chronic conditions during your regular appointments.  Please note that Annual Wellness Visits do not include a physical exam. Some assessments may be limited, especially if the visit was conducted virtually. If needed, we may recommend a separate in-person follow-up with your provider.  Ongoing Care Seeing your primary care provider every 3 to 6 months helps us  monitor your health and provide consistent, personalized care.   Referrals If a referral was made during today's visit and you haven't received any updates within two weeks, please contact the referred provider directly to check on the status.  Recommended Screenings:  Health Maintenance  Topic Date Due   Yearly kidney health urinalysis for diabetes  Never done   Hepatitis C Screening  Never done   Hepatitis B Vaccine (1 of 3 - 19+ 3-dose series) Never done   Pneumococcal Vaccine (2 of 2 - PCV) 02/23/2015   Eye exam for diabetics  12/01/2017   Complete foot exam   06/11/2018   COVID-19 Vaccine (3 - Pfizer risk series) 10/22/2020   Flu Shot  12/24/2023   Hemoglobin A1C  01/13/2024   Yearly kidney function blood test for diabetes  02/22/2025   Medicare Annual Wellness Visit  02/24/2025   Pap with HPV screening  05/06/2025   Breast Cancer Screening  11/03/2025   Colon Cancer Screening  04/21/2026   DTaP/Tdap/Td vaccine (3 - Td or Tdap) 08/14/2033   HIV Screening  Completed   HPV Vaccine  Aged Out   Meningitis B Vaccine  Aged Out       05/12/2023   10:06  PM  Advanced Directives  Does Patient Have a Medical Advance Directive? No  Would patient like information on creating a medical advance directive? No - Patient declined   Advance Care Planning is important because it: Ensures you receive medical care that aligns with your values, goals, and preferences. Provides guidance to your family and loved ones, reducing the emotional burden of decision-making during critical moments.  Vision: Annual vision screenings are recommended for early detection of glaucoma, cataracts, and diabetic retinopathy. These exams can also reveal signs of chronic conditions such as diabetes and high blood pressure.  Dental: Annual dental screenings help detect early signs of oral cancer, gum disease, and other conditions linked to overall health, including heart disease and diabetes.

## 2024-02-26 ENCOUNTER — Emergency Department (HOSPITAL_COMMUNITY)

## 2024-02-26 ENCOUNTER — Other Ambulatory Visit: Payer: Self-pay

## 2024-02-26 ENCOUNTER — Emergency Department (HOSPITAL_COMMUNITY)
Admission: EM | Admit: 2024-02-26 | Discharge: 2024-02-26 | Disposition: A | Discharge status: Home / Self Care | Attending: Emergency Medicine | Admitting: Emergency Medicine

## 2024-02-26 DIAGNOSIS — I5022 Chronic systolic (congestive) heart failure: Secondary | ICD-10-CM

## 2024-02-26 DIAGNOSIS — I951 Orthostatic hypotension: Secondary | ICD-10-CM | POA: Diagnosis not present

## 2024-02-26 DIAGNOSIS — I952 Hypotension due to drugs: Secondary | ICD-10-CM | POA: Diagnosis not present

## 2024-02-26 DIAGNOSIS — R42 Dizziness and giddiness: Secondary | ICD-10-CM

## 2024-02-26 DIAGNOSIS — R55 Syncope and collapse: Secondary | ICD-10-CM

## 2024-02-26 DIAGNOSIS — E876 Hypokalemia: Secondary | ICD-10-CM | POA: Diagnosis not present

## 2024-02-26 DIAGNOSIS — R110 Nausea: Secondary | ICD-10-CM | POA: Diagnosis not present

## 2024-02-26 DIAGNOSIS — G4489 Other headache syndrome: Secondary | ICD-10-CM | POA: Diagnosis not present

## 2024-02-26 DIAGNOSIS — I428 Other cardiomyopathies: Secondary | ICD-10-CM

## 2024-02-26 DIAGNOSIS — I509 Heart failure, unspecified: Secondary | ICD-10-CM | POA: Insufficient documentation

## 2024-02-26 LAB — CBC WITH DIFFERENTIAL/PLATELET
Abs Immature Granulocytes: 0.03 K/uL (ref 0.00–0.07)
Basophils Absolute: 0 K/uL (ref 0.0–0.1)
Basophils Relative: 0 %
Eosinophils Absolute: 0 K/uL (ref 0.0–0.5)
Eosinophils Relative: 0 %
HCT: 46.9 % — ABNORMAL HIGH (ref 36.0–46.0)
Hemoglobin: 15 g/dL (ref 12.0–15.0)
Immature Granulocytes: 0 %
Lymphocytes Relative: 17 %
Lymphs Abs: 1.6 K/uL (ref 0.7–4.0)
MCH: 28.9 pg (ref 26.0–34.0)
MCHC: 32 g/dL (ref 30.0–36.0)
MCV: 90.4 fL (ref 80.0–100.0)
Monocytes Absolute: 0.8 K/uL (ref 0.1–1.0)
Monocytes Relative: 8 %
Neutro Abs: 7.2 K/uL (ref 1.7–7.7)
Neutrophils Relative %: 75 %
Platelets: 289 K/uL (ref 150–400)
RBC: 5.19 MIL/uL — ABNORMAL HIGH (ref 3.87–5.11)
RDW: 14.1 % (ref 11.5–15.5)
WBC: 9.7 K/uL (ref 4.0–10.5)
nRBC: 0 % (ref 0.0–0.2)

## 2024-02-26 LAB — BASIC METABOLIC PANEL WITH GFR
Anion gap: 15 (ref 5–15)
BUN: 42 mg/dL — ABNORMAL HIGH (ref 6–20)
CO2: 28 mmol/L (ref 22–32)
Calcium: 9.7 mg/dL (ref 8.9–10.3)
Chloride: 91 mmol/L — ABNORMAL LOW (ref 98–111)
Creatinine, Ser: 1.72 mg/dL — ABNORMAL HIGH (ref 0.44–1.00)
GFR, Estimated: 36 mL/min — ABNORMAL LOW (ref >60)
Glucose, Bld: 110 mg/dL — ABNORMAL HIGH (ref 70–99)
Potassium: 2.9 mmol/L — ABNORMAL LOW (ref 3.5–5.1)
Sodium: 134 mmol/L — ABNORMAL LOW (ref 135–145)

## 2024-02-26 LAB — URINALYSIS, ROUTINE W REFLEX MICROSCOPIC
Bilirubin Urine: NEGATIVE
Glucose, UA: 50 mg/dL — AB
Hgb urine dipstick: NEGATIVE
Ketones, ur: NEGATIVE mg/dL
Leukocytes,Ua: NEGATIVE
Nitrite: NEGATIVE
Protein, ur: NEGATIVE mg/dL
Specific Gravity, Urine: 1.013 (ref 1.005–1.030)
pH: 5 (ref 5.0–8.0)

## 2024-02-26 LAB — BRAIN NATRIURETIC PEPTIDE: B Natriuretic Peptide: 52.8 pg/mL (ref 0.0–100.0)

## 2024-02-26 LAB — CBG MONITORING, ED: Glucose-Capillary: 113 mg/dL — ABNORMAL HIGH (ref 70–99)

## 2024-02-26 LAB — MAGNESIUM: Magnesium: 2.4 mg/dL (ref 1.7–2.4)

## 2024-02-26 LAB — TROPONIN I (HIGH SENSITIVITY): Troponin I (High Sensitivity): 8 ng/L (ref <18)

## 2024-02-26 MED ORDER — SODIUM CHLORIDE 0.9 % IV BOLUS
250.0000 mL | Freq: Once | INTRAVENOUS | Status: AC
  Administered 2024-02-26: 250 mL via INTRAVENOUS

## 2024-02-26 MED ORDER — POTASSIUM CHLORIDE 10 MEQ/100ML IV SOLN
10.0000 meq | INTRAVENOUS | Status: AC
  Administered 2024-02-26 (×2): 10 meq via INTRAVENOUS
  Filled 2024-02-26 (×2): qty 100

## 2024-02-26 MED ORDER — ACETAMINOPHEN 500 MG PO TABS
1000.0000 mg | ORAL_TABLET | Freq: Once | ORAL | Status: AC
  Administered 2024-02-26: 1000 mg via ORAL
  Filled 2024-02-26: qty 2

## 2024-02-26 MED ORDER — POTASSIUM CHLORIDE CRYS ER 20 MEQ PO TBCR
40.0000 meq | EXTENDED_RELEASE_TABLET | Freq: Once | ORAL | Status: AC
  Administered 2024-02-26: 40 meq via ORAL

## 2024-02-26 NOTE — ED Notes (Signed)
 CCMD called.

## 2024-02-26 NOTE — ED Provider Notes (Signed)
 [place] EMERGENCY DEPARTMENT AT [facility] Provider Note   CSN: [id number] Arrival date & time: 02/26/24  1207     Patient presents with: Near Syncope   [name] is a 48 y.o. female.  48 year old female presents to the ED via ambulance with complaints of near syncope.  Patient advises she was at an event which took place outside where she decided to get a massage massage at a popup massage tent.  Patient advises while she was getting the neck massage she started to feel really lightheaded and dizzy and got sweaty.  She stopped the massage and went to the [facility] tent to get her blood pressure checked.  She reports they were unable to verify her blood pressure because the machine would not read it.  She started to walk with family and said that she was getting very weak and dizzy and laid down.  They got her cardiac nurse that was at the event and they raised her legs up while she was laying on the ground which eases symptoms.  She then went to the ambulance that was stationed at the event and continued to raise her legs and lay flat.  The ambulance was called to transport her to be evaluated.  And route prior to arrival she received Zofran  which relieved symptoms of nausea.  Patient has had these issues in the past due to hypotension.  Patient has significant history of congestive heart failure.  Last echo noted 25% ejection fraction in March 2025.  Patient currently has no dizziness or nausea just a mild headache.     Prior to Admission medications   Medication Sig Start Date End Date Taking? Authorizing Provider  acetaminophen  (TYLENOL ) 650 MG CR tablet Take 650 mg by mouth every 8 (eight) hours as needed for pain (headache).    [provider]  albuterol  (VENTOLIN  HFA) 108 (90 Base) MCG/ACT inhaler Inhale 1-2 puffs into the lungs every 6 (six) hours as needed for wheezing or shortness of breath. 09/06/23   Rollene Almarie LABOR, MD  budesonide -formoterol  (SYMBICORT )  160-4.5 MCG/ACT inhaler Inhale 2 puffs into the lungs 2 (two) times daily as needed. 09/06/23   Rollene Almarie LABOR, MD  diclofenac  (VOLTAREN ) 75 MG EC tablet Take 1 tablet by mouth twice daily as needed 02/16/24   Smith, Zachary M, DO  dicyclomine  (BENTYL ) 20 MG tablet Take 1 tablet (20 mg total) by mouth daily as needed for spasms. 05/30/20   Rollene Almarie LABOR, MD  fluticasone  (FLONASE ) 50 MCG/ACT nasal spray Place 2 sprays into both nostrils daily as needed for allergies. 09/06/23   Rollene Almarie LABOR, MD  gabapentin  (NEURONTIN ) 300 MG capsule Take 1 capsule (300 mg total) by mouth at bedtime as needed (back spasms). 05/30/20   Rollene Almarie LABOR, MD  ipratropium (ATROVENT ) 0.02 % nebulizer solution Inhale 500 mcg into the lungs.    [provider]  ipratropium-albuterol  (DUONEB) 0.5-2.5 (3) MG/3ML SOLN USE 1 AMPULE IN NEBULIZER EVERY 6 HOURS AS NEEDED 09/02/23   Rollene Almarie LABOR, MD  ivabradine  (CORLANOR ) 7.5 MG TABS tablet Take 1 tablet (7.5 mg total) by mouth 2 (two) times daily with a meal. 06/01/23   Bensimhon, Toribio SAUNDERS, MD  JARDIANCE  25 MG TABS tablet TAKE 1 TABLET BY MOUTH ONCE DAILY BEFORE BREAKFAST 02/18/24   Rollene Almarie LABOR, MD  Magnesium  Oxide 200 MG TABS Take 1 tablet (200 mg total) by mouth daily. 05/30/20   Bensimhon, Toribio SAUNDERS, MD  metolazone  (ZAROXOLYN ) 2.5 MG  tablet TAKE 1 TABLET BY MOUTH AS DIRECTED 02/16/24   Bensimhon, Toribio SAUNDERS, MD  metoprolol  succinate (TOPROL -XL) 50 MG 24 hr tablet Take 1 tablet (50 mg total) by mouth daily. Take with or immediately following a meal. 01/20/24   Bensimhon, Toribio SAUNDERS, MD  nitroGLYCERIN  (NITROSTAT ) 0.4 MG SL tablet Place 1 tablet (0.4 mg total) under the tongue every 5 (five) minutes as needed for chest pain. 06/20/21   Bensimhon, Toribio SAUNDERS, MD  OVER THE COUNTER MEDICATION Take 1 capsule by mouth daily. BARIATRIC VITAMIN    [provider]  potassium chloride  SA (KLOR-CON  M) 20 MEQ tablet Take 3 tablets (60 mEq total) by  mouth 2 (two) times daily. 08/06/23   Milford, Harlene HERO, FNP  sacubitril -valsartan  (ENTRESTO ) 24-26 MG Take 1 tablet by mouth 2 (two) times daily. 02/23/24   Milford, Harlene HERO, FNP  spironolactone  (ALDACTONE ) 25 MG tablet Take 1 tablet (25 mg total) by mouth at bedtime. 06/15/23   Bensimhon, Toribio SAUNDERS, MD  tizanidine  (ZANAFLEX ) 2 MG capsule Take 2 mg by mouth as needed for muscle spasms.    [provider]  torsemide  (DEMADEX ) 20 MG tablet Take 40 mg by mouth 2 (two) times daily.    [provider]  Ubrogepant  (UBRELVY ) 100 MG TABS Take 1 tablet (100 mg total) by mouth daily as needed (migraine). 01/07/23   Rollene Almarie LABOR, MD  Vitamin D , Ergocalciferol , (DRISDOL ) 1.25 MG (50000 UNIT) CAPS capsule Take 1 capsule by mouth once a week 01/10/24   Rollene Almarie LABOR, MD    Allergies: Vancomycin , Aspirin , Contrast media [iodinated contrast media], Ciprofloxacin, Cyclobenzaprine , Farxiga  [dapagliflozin], and Sulfa antibiotics    Review of Systems  Neurological:  Positive for dizziness and light-headedness.  All other systems reviewed and are negative.   Updated Vital Signs BP (!) 116/57 (BP Location: Right Arm)   Pulse 68   Temp 98.3 F (36.8 C) (Oral)   Resp 13   SpO2 100%   Physical Exam Vitals and nursing note reviewed.  Constitutional:      Appearance: Normal appearance.  HENT:     Head: Normocephalic and atraumatic.     Nose: Nose normal.  Eyes:     Extraocular Movements: Extraocular movements intact.     Conjunctiva/sclera: Conjunctivae normal.     Pupils: Pupils are equal, round, and reactive to light.  Cardiovascular:     Rate and Rhythm: Normal rate.  Pulmonary:     Effort: Pulmonary effort is normal. No respiratory distress.  Abdominal:     Tenderness: There is no abdominal tenderness.  Musculoskeletal:        General: No tenderness. Normal range of motion.     Cervical back: Normal range of motion.  Skin:    General: Skin is warm.      Capillary Refill: Capillary refill takes less than 2 seconds.  Neurological:     General: No focal deficit present.     Mental Status: She is alert and oriented to person, place, and time.     Cranial Nerves: No cranial nerve deficit.     Sensory: No sensory deficit.     Motor: No weakness.  Psychiatric:        Mood and Affect: Mood normal.        Behavior: Behavior normal.     (all labs ordered are listed, but only abnormal results are displayed) Labs Reviewed  CBC WITH DIFFERENTIAL/PLATELET - Abnormal; Notable for the following components:      Result  Value   RBC 5.19 (*)    HCT 46.9 (*)    All other components within normal limits  URINALYSIS, ROUTINE W REFLEX MICROSCOPIC - Abnormal; Notable for the following components:   APPearance HAZY (*)    Glucose, UA 50 (*)    All other components within normal limits  BASIC METABOLIC PANEL WITH GFR - Abnormal; Notable for the following components:   Sodium 134 (*)    Potassium 2.9 (*)    Chloride 91 (*)    Glucose, Bld 110 (*)    BUN 42 (*)    Creatinine, Ser 1.72 (*)    GFR, Estimated 36 (*)    All other components within normal limits  CBG MONITORING, ED - Abnormal; Notable for the following components:   Glucose-Capillary 113 (*)    All other components within normal limits  BRAIN NATRIURETIC PEPTIDE  MAGNESIUM   TROPONIN I (HIGH SENSITIVITY)    EKG: None  Radiology: DG Chest Portable 1 View Result Date: 02/26/2024 CLINICAL DATA:  CHF EXAM: PORTABLE CHEST 1 VIEW COMPARISON:  Chest x-ray 05/12/2023 FINDINGS: Left chest wall single lead cardiac defibrillator. Right chest wall single lead neural fibrillator coursing cranially-likely vagal stimulator. The heart and mediastinal contours are unchanged. No focal consolidation. No pulmonary edema. No pleural effusion. No pneumothorax. No acute osseous abnormality. IMPRESSION: No active disease. Electronically Signed   By: Morgane  Naveau M.D.   On: 02/26/2024 13:03    Procedures    Medications Ordered in the ED  acetaminophen  (TYLENOL ) tablet 1,000 mg (1,000 mg Oral Given 02/26/24 1330)  potassium chloride  10 mEq in 100 mL IVPB (10 mEq Intravenous New Bag/Given 02/26/24 1700)  potassium chloride  SA (KLOR-CON  M) CR tablet 40 mEq (40 mEq Oral Given 02/26/24 1606)  sodium chloride  0.9 % bolus 250 mL (250 mLs Intravenous New Bag/Given 02/26/24 1554)  sodium chloride  0.9 % bolus 250 mL (0 mLs Intravenous Stopped 02/26/24 1610)    48 y.o. female presents to the ED with complaints of near syncope with nausea and headache, this involves an extensive number of treatment options, and is a complaint that carries with it a high risk of complications and morbidity.  The differential diagnosis includes vasovagal, dehydration, ACS, CHF exacerbation, electrolyte imbalance, CVA, TIA, (Ddx)  On arrival pt is nontoxic, vitals unremarkable. Exam significant for reported headache  Additional history obtained from chart review significant for patient being seen by cardiology for congestive heart failure last ejection fraction was 25% in March 2025.  I ordered medication Tylenol  for headache  Lab Tests:  I Ordered, reviewed, and interpreted labs, which included: BMP, CBC, troponin, BNP, UA, CBG  Imaging Studies ordered:  I ordered imaging studies which included chest x-ray, I independently visualized and interpreted imaging which showed no acute abnormality  ED Course:   48 year old female presents to ED with complaints of near syncope.  Patient has significant history of CHF with multiple episodes of near syncope due to hypotension.  Patient sitting in ED bed in no acute distress nontoxic-appearing.  Lungs are clear to auscultation in all fields.  Patient has no abdominal pain to palpation or reported abdominal pain.  No current chest pain or shortness of breath noted by patient.  Patient was given Tylenol  for mild headache.  Patient does not have any signs of fluid overload including  bilateral extremity edema.  Lungs are clear to auscultation.  Patient does not appear dry and does not seem in distress at this time.  Initial troponin is unremarkable BNP  unremarkable as well.  BMP noted some mild hypokalemia patient was started on potassium.  Creatinine was mildly elevated and GFR decreased from last CMP.  No other remarkable findings on lab work.  Cardiology was consulted.  They advised they would come see her in the ED.  They also advised to get orthostatic vitals on her and if positive start 250 cc of fluid.  Patient laying blood pressure 113/61, standing 82/54.  Patient will be started on 250 cc fluid per cardiology recommendation.  Dr. Mallipeddi with cardiology advised after ICD interrogation and he will arrange for close follow-up outpatient.  Patient was given another 250 cc of fluid.  On reassessment patient is sitting comfortably in ED bed in no acute distress with no new complaints and reports headache decrease in severity with Tylenol .    On interrogation report May 8 was last incident report.  Patient will be discharged with close follow-up with cardiology.  Patient was advised to follow all cardiologist recommendation and stopping all blood pressure medications.  Patient was advised to continue taking prescribed medications and following cardiology recommendation with close follow-up.  Patient agreed with treatment plan and was comfortable discharge.  Portions of this note were generated with Scientist, clinical (histocompatibility and immunogenetics). Dictation errors may occur despite best attempts at proofreading.   Final diagnoses:  Near syncope  Orthostatic hypotension    ED Discharge Orders          Ordered    Ambulatory referral to Cardiology       Comments: If you have not heard from the Cardiology office within the next 72 hours please call [phone].   02/26/24 2016               Myriam Fonda GORMAN DEVONNA 02/26/24 2130    Yolande Lamar BROCKS, MD 03/03/24 [phone]

## 2024-02-26 NOTE — ED Notes (Signed)
 Pt aware we need urine sample. She will let staff know when she's able to go

## 2024-02-26 NOTE — Consult Note (Addendum)
 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  Patient reported that she went to the heart to walk this morning.  Prior to event walking, she started to feel sudden onset of generalized body weakness that she is about to faint.  She did not lose consciousness.  She still got up and started to go around but felt nauseous and had to sit down.  The pressure checked at that time was around 80 mmHg SBP.  She stated that since her PVC ablation in July 2025 at [facility], her blood pressures have been normalizing and hence HF clinic had to cut back on the doses of GDMT.  She is also actively losing weight.  She has been compliant with the fluid regimen, does not think she is dehydrated.  Labs remarkable for serum creatinine 1.16 on 02/23/2024 but today it is 1.72.  Orthostatics not checked.  Does not have any symptoms of angina, DOE.  BNP within normal limits.   Past Medical History:  Diagnosis Date   Acute on chronic systolic (congestive) heart failure (HCC) 04/29/2017   Acute pain of right shoulder 06/16/2017   ADHD    AICD (automatic cardioverter/defibrillator) present    Anginal pain    Anxiety    Arthritis    right shoulder    Asthma    Back pain    CHF (congestive heart failure) (HCC)    Chronic combined systolic and diastolic heart failure (HCC) 03/05/2014   Closed low lateral malleolus fracture 10/23/2013   Constipation    Cystitis 10/21/2017   Depression    Depression with anxiety 01/20/2013    Diabetes mellitus without complication (HCC)    Diverticulosis    Dyspnea    comes and goes intermittently mostly with exertion    Dysrhythmia    Edema, lower extremity    Essential hypertension    Prev followed by [name]    Fibroid    age 26   Gallstones    Generalized abdominal cramping    History of cardiomyopathy    Hypertension    IBS (irritable bowel syndrome)    ICD (implantable cardioverter-defibrillator), single, 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 CPAP intolerance >> smothering Good tolerance of auto device (optimal pressure 12-13 on download).  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   PVC (premature ventricular contraction) 06/23/2016   Seizures (HCC)    as a child (08/03/2016)   SOB (shortness of breath)    Termination of pregnancy    due to cardiac risk   Vitamin B12 deficiency    Vitamin D  deficiency     Past Surgical History:  Procedure Laterality Date   BREAST BIOPSY Right 09/30/2022   US  RT BREAST BX W LOC DEV 1ST LESION IMG BX SPEC US  GUIDE 09/30/2022 [facility] MAMMOGRAPHY   CARDIAC CATHETERIZATION N/A 11/11/2015   Procedure: Right/Left Heart Cath and Coronary Angiography;  Surgeon: [name], MD;  Location: [facility] INVASIVE CV LAB;  Service: Cardiovascular;  Laterality: N/A;   CARDIAC  CATHETERIZATION  ~ 2015   CARDIAC DEFIBRILLATOR PLACEMENT  08/03/2016   CESAREAN SECTION  1999   COLONOSCOPY WITH PROPOFOL  N/A 04/21/2016   Procedure: COLONOSCOPY WITH PROPOFOL ;  Surgeon: [name], MD;  Location: [facility];  Service: Gastroenterology;  Laterality: N/A;   ESOPHAGOGASTRODUODENOSCOPY (EGD) WITH PROPOFOL  N/A 04/21/2016   Procedure: ESOPHAGOGASTRODUODENOSCOPY (EGD) WITH PROPOFOL ;  Surgeon: [name], MD;  Location: [facility];  Service: Gastroenterology;  Laterality: N/A;   FOOT FRACTURE SURGERY Right ~ 2003   FRACTURE SURGERY     ICD IMPLANT N/A 08/03/2016   Procedure: ICD Implant;  Surgeon: [name], MD;  Location: [facility] INVASIVE CV LAB;  Service: Cardiovascular;  Laterality: N/A;   IR ANGIOGRAM PELVIS SELECTIVE OR SUPRASELECTIVE  10/28/2022   IR ANGIOGRAM SELECTIVE EACH ADDITIONAL VESSEL  10/28/2022   IR ANGIOGRAM SELECTIVE EACH ADDITIONAL VESSEL  10/28/2022   IR ANGIOGRAM SELECTIVE EACH ADDITIONAL VESSEL  10/28/2022   IR EMBO TUMOR ORGAN ISCHEMIA INFARCT INC GUIDE ROADMAPPING  10/28/2022   IR US  GUIDE VASC ACCESS LEFT  10/28/2022   IR US  GUIDE VASC ACCESS RIGHT  10/28/2022   LAPAROSCOPIC CHOLECYSTECTOMY  12/2006   LAPAROSCOPIC GASTRIC SLEEVE RESECTION     LAPAROSCOPY ABDOMEN DIAGNOSTIC  2008   cut bile duct w/gallbladder OR; had to go in later & fix leak; hospitalized for 2 months   LEFT HEART CATHETERIZATION WITH CORONARY ANGIOGRAM N/A 02/26/2014   Procedure: LEFT HEART CATHETERIZATION WITH CORONARY ANGIOGRAM;  Surgeon: Candyce GORMAN Reek, MD;  Location: [facility] CATH LAB;  Service: Cardiovascular;  Laterality: N/A;   OVARIAN CYST REMOVAL Right 1999   PVC ABLATION N/A 12/01/2017   Procedure: PVC ABLATION;  Surgeon: Waddell Danelle ORN, MD;  Location: [facility] INVASIVE CV LAB;  Service: Cardiovascular;  Laterality: N/A;   RIGHT HEART CATH N/A 08/05/2017   Procedure: RIGHT HEART CATH;  Surgeon: [name], MD;  Location: [facility] INVASIVE CV LAB;  Service: Cardiovascular;  Laterality:  N/A;   RIGHT HEART CATH N/A 11/16/2019   Procedure: RIGHT HEART CATH;  Surgeon: [name], MD;  Location: [facility] INVASIVE CV LAB;  Service: Cardiovascular;  Laterality: N/A;   TUBAL LIGATION  1999       Inpatient Medications: Scheduled Meds:  potassium chloride   40 mEq Oral Once   Continuous Infusions:  potassium chloride      PRN Meds:   Allergies:    Allergies  Allergen Reactions   Vancomycin  Other (See Comments)    did something to my kidneys, PROGRESSED TO KIDNEY FAILURE!!   Aspirin  Other (See Comments)    Wheezing, (Pt states that she just wheezes some when she takes aspirin  by itself but she can take aspirin  in a combination product).   Contrast Media [Iodinated Contrast Media] Other (See Comments)  Multiple CT contrast studies done over 2 weeks caused ARF   Ciprofloxacin Itching and Rash   Cyclobenzaprine  Other (See Comments)    Can not tolerate   Farxiga  [Dapagliflozin] Rash   Sulfa Antibiotics Itching and Rash    Social History:   Social History   Socioeconomic History   Marital status: Married    Spouse name: Beryl   Number of children: 1   Years of education: Not on file   Highest education level: Associate degree: academic program  Occupational History   Occupation: stay at home mom  Tobacco Use   Smoking status: Never    Passive exposure: Never   Smokeless tobacco: Never  Vaping Use   Vaping status: Never Used  Substance and Sexual Activity   Alcohol use: No   Drug use: No   Sexual activity: Yes    Birth control/protection: Surgical    Comment: BTL, Ablation  Other Topics Concern   Not on file  Social History Narrative   Lives with husband, daughter niece and great nephew   Social Drivers of Corporate investment banker Strain: Low Risk  (02/25/2024)   Overall Financial Resource Strain (CARDIA)    Difficulty of Paying Living Expenses: Not very hard  Food Insecurity: No Food Insecurity (02/25/2024)   Hunger Vital Sign    Worried  About Running Out of Food in the Last Year: Never true    Ran Out of Food in the Last Year: Never true  Transportation Needs: No Transportation Needs (02/25/2024)   PRAPARE - Administrator, Civil Service (Medical): No    Lack of Transportation (Non-Medical): No  Physical Activity: Sufficiently Active (02/25/2024)   Exercise Vital Sign    Days of Exercise per Week: 5 days    Minutes of Exercise per Session: 90 min  Stress: Stress Concern Present (02/25/2024)   [employer] of Occupational Health - Occupational Stress Questionnaire    Feeling of Stress: Very much  Social Connections: Socially Isolated (02/25/2024)   Social Connection and Isolation Panel    Frequency of Communication with Friends and Family: Never    Frequency of Social Gatherings with Friends and Family: Never    Attends Religious Services: Never    Database administrator or Organizations: No    Attends Banker Meetings: Never    Marital Status: Married  Catering manager Violence: Not At Risk (02/25/2024)   Humiliation, Afraid, Rape, and Kick questionnaire    Fear of Current or Ex-Partner: No    Emotionally Abused: No    Physically Abused: No    Sexually Abused: No    Family History:    Family History  Problem Relation Age of Onset   Rheum arthritis Mother    Obesity Mother    Emphysema Maternal Grandmother        smoked   Heart disease Maternal Grandmother 67       MI   Allergies Daughter    Colon cancer Neg Hx      ROS:  Please see the history of present illness.  ROS  All other ROS reviewed and negative.     Physical Exam/Data:   Vitals:   02/26/24 1215 02/26/24 1216  BP: 126/85 126/85  Pulse: 65   Resp: 12   Temp:  98.5 F (36.9 C)  TempSrc:  Oral  SpO2: 98% 100%   No intake or output data in the 24 hours ending 02/26/24 1528 There were no vitals filed for this  visit. There is no height or weight on file to calculate BMI.  General:  Well nourished, well  developed, in no acute distress HEENT: normal Lymph: no adenopathy Neck: no JVD Endocrine:  No thryomegaly Vascular: No carotid bruits; FA pulses 2+ bilaterally without bruits  Cardiac:  normal S1, S2; RRR; no murmur  Lungs:  clear to auscultation bilaterally, no wheezing, rhonchi or rales  Abd: soft, nontender, no hepatomegaly  Ext: no edema Musculoskeletal:  No deformities, BUE and BLE strength normal and equal Skin: warm and dry  Neuro:  CNs 2-12 intact, no focal abnormalities noted Psych:  Normal affect   EKG:  The EKG was personally reviewed and demonstrates:   Telemetry:  Telemetry was personally reviewed and demonstrates:    Relevant CV Studies:   Laboratory Data:  Chemistry Recent Labs  Lab 02/23/24 1600 02/26/24 1233  NA 135 134*  K 4.1 2.9*  CL 97* 91*  CO2 26 28  GLUCOSE 98 110*  BUN 15 42*  CREATININE 1.16* 1.72*  CALCIUM 9.8 9.7  GFRNONAA 58* 36*  ANIONGAP 12 15    No results for input(s): PROT, ALBUMIN , AST, ALT, ALKPHOS, BILITOT in the last 168 hours. Hematology Recent Labs  Lab 02/23/24 1600 02/26/24 1233  WBC 7.9 9.7  RBC 4.97 5.19*  HGB 14.6 15.0  HCT 44.1 46.9*  MCV 88.7 90.4  MCH 29.4 28.9  MCHC 33.1 32.0  RDW 14.4 14.1  PLT 278 289   Cardiac EnzymesNo results for input(s): TROPONINI in the last 168 hours. No results for input(s): TROPIPOC in the last 168 hours.  BNP Recent Labs  Lab 02/23/24 1600 02/26/24 1233  BNP 66.1 52.8    DDimer No results for input(s): DDIMER in the last 168 hours.  Radiology/Studies:  DG Chest Portable 1 View Result Date: 02/26/2024 CLINICAL DATA:  CHF EXAM: PORTABLE CHEST 1 VIEW COMPARISON:  Chest x-ray 05/12/2023 FINDINGS: Left chest wall single lead cardiac defibrillator. Right chest wall single lead neural fibrillator coursing cranially-likely vagal stimulator. The heart and mediastinal contours are unchanged. No focal consolidation. No pulmonary edema. No pleural effusion. No  pneumothorax. No acute osseous abnormality. IMPRESSION: No active disease. Electronically Signed   By: Morgane  Naveau M.D.   On: 02/26/2024 13:03   US  PELVIS LIMITED (TRANSABDOMINAL ONLY) Result Date: 02/24/2024 CLINICAL DATA:  LEFT inguinal pain EXAM: LIMITED ULTRASOUND OF PELVIS TECHNIQUE: Limited transabdominal ultrasound examination of the pelvis was performed. COMPARISON:  None available. FINDINGS: No mass or hernia identified targeted sonographic evaluation of the LEFT inguinal region. IMPRESSION: No sonographic abnormality identified in the LEFT inguinal region. Electronically Signed   By: Aliene Lloyd M.D.   On: 02/24/2024 21:34    Assessment and Plan:   Presyncope likely due to polypharmacy - Presented with sudden onset of generalized body weakness, nausea and feeling like she is able to pass out at the heart walk today.  No LOC.  BP at that time was around 80 mmHg SBP. - She reported since PVC ablation in July 2025 at [facility], her blood pressure has been dropping (coming back to normal according to the patient) due to which AHF clinic has been cutting back on the doses of GDMT. - She did not take any medications this morning. - Labs remarkable for AKI, serum creatinine 1.16 on 02/23/2024, 1.72 this a.m. - Compliant with p.o. fluid intake. - Hold all GDMT. - Okay to give IVF to 50 mL bolus and IVF 50 cc/h for about 10 hours. - Repeat BMP  tonight. - EKG showed NSR. - Obtain orthostatics.  Frequent PVC s/p PVC ablation in July 2025 - LVEF 25% in March 2025.  Will need to update echocardiogram as she had PVC ablation in July 2025.  PVC burden was 25% prior to ablation and after ablation it was 7.5%.  She was taken off antiarrhythmics.  NICM LVEF 25% (TTN mediated cardiomyopathy) s/p [employer] ICD - GDMT on hold due to above. - Follows up with advanced heart failure team to initiate advanced heart failure therapies.   45 minutes spent in reviewing prior specialist notes, records,  reports/test, more than 3 labs, discussion of the above problems with the patient and her family at the bedside and documentation.   For questions or updates, please contact [facility] Please consult [email or web address] for contact info under Cardiology/STEMI.   Signed, Emilene Roma Priya Minor Iden, MD 02/26/2024 3:28 PM

## 2024-02-26 NOTE — ED Triage Notes (Signed)
 Patient was participating in massage therapy. Did not take medications today to prevent hypotension. A&O x4. Systolic 90s

## 2024-02-26 NOTE — Discharge Instructions (Addendum)
 Lab work was reassuring today.  It is advised to closely follow-up with cardiologist and follow all recommendations.  Continue to take potassium pill as prescribed.  Discontinue blood pressure medication as advised by cardiologist until further recommended.  Monitor for worsening symptoms including fainting, dizziness, chest pain, shortness of breath.  If these or any other concerning symptoms occur please return to the ED for further evaluation.

## 2024-02-28 ENCOUNTER — Encounter (HOSPITAL_COMMUNITY): Admission: RE | Admit: 2024-02-28 | Source: Ambulatory Visit

## 2024-02-28 ENCOUNTER — Telehealth (HOSPITAL_COMMUNITY): Payer: Self-pay | Admitting: Cardiology

## 2024-02-28 ENCOUNTER — Telehealth (HOSPITAL_COMMUNITY): Payer: Self-pay

## 2024-02-28 NOTE — Telephone Encounter (Signed)
 [facility] Adv RN called to report pt was seen in ER 10/4 Advised to stop all cardiac medications  Pt questioned when she should restart meds    No ER follow up scheduled as of 10/6

## 2024-02-28 NOTE — Telephone Encounter (Signed)
 Patient left message calling out for 12:30 CR class, no reason given. She did state she would like to make up her missed classes.

## 2024-02-29 ENCOUNTER — Encounter (HOSPITAL_COMMUNITY): Payer: Self-pay

## 2024-02-29 ENCOUNTER — Telehealth: Payer: Self-pay

## 2024-02-29 ENCOUNTER — Encounter (HOSPITAL_COMMUNITY): Payer: Self-pay | Admitting: Physician Assistant

## 2024-02-29 NOTE — Telephone Encounter (Signed)
 Alert received from [employer] for Shock impedance 147 ohms, increase per trends.  Spoke to [place] from [facility] and suggested possibly calcified at coil. Need to discuss with Dr. Waddell in office 03/01/24 to advise further.

## 2024-02-29 NOTE — Progress Notes (Signed)
 Cardiac Individual Treatment Plan  Patient Details  Name: [name] MRN: [id number] Date of Birth: 1975-08-19 Referring Provider:   Flowsheet Row INTENSIVE CARDIAC REHAB ORIENT from 01/03/2024 in [facility]  Referring Provider [name], MD    Initial Encounter Date:  Flowsheet Row INTENSIVE CARDIAC REHAB ORIENT from 01/03/2024 in [facility] [place] [facility]  Date 01/03/24    Visit Diagnosis: No diagnosis found.  [name] Home Medications on Admission:  Current Outpatient Medications:    acetaminophen  (TYLENOL ) 650 MG CR tablet, Take 650 mg by mouth every 8 (eight) hours as needed for pain (headache)., Disp: , Rfl:    albuterol  (VENTOLIN  HFA) 108 (90 Base) MCG/ACT inhaler, Inhale 1-2 puffs into the lungs every 6 (six) hours as needed for wheezing or shortness of breath., Disp: 18 g, Rfl: 0   budesonide -formoterol  (SYMBICORT ) 160-4.5 MCG/ACT inhaler, Inhale 2 puffs into the lungs 2 (two) times daily as needed., Disp: 10 g, Rfl: 3   diclofenac  (VOLTAREN ) 75 MG EC tablet, Take 1 tablet by mouth twice daily as needed, Disp: 60 tablet, Rfl: 0   dicyclomine  (BENTYL ) 20 MG tablet, Take 1 tablet (20 mg total) by mouth daily as needed for spasms., Disp: 90 tablet, Rfl: 3   fluticasone  (FLONASE ) 50 MCG/ACT nasal spray, Place 2 sprays into both nostrils daily as needed for allergies., Disp: 16 g, Rfl: 3   gabapentin  (NEURONTIN ) 300 MG capsule, Take 1 capsule (300 mg total) by mouth at bedtime as needed (back spasms)., Disp: 90 capsule, Rfl: 3   ipratropium (ATROVENT ) 0.02 % nebulizer solution, Inhale 500 mcg into the lungs., Disp: , Rfl:    ipratropium-albuterol  (DUONEB) 0.5-2.5 (3) MG/3ML SOLN, USE 1 AMPULE IN NEBULIZER EVERY 6 HOURS AS NEEDED, Disp: 360 mL, Rfl: 0   ivabradine  (CORLANOR ) 7.5 MG TABS tablet, Take 1 tablet (7.5 mg total) by mouth 2 (two) times daily with a meal., Disp:  60 tablet, Rfl: 11   JARDIANCE  25 MG TABS tablet, TAKE 1 TABLET BY MOUTH ONCE DAILY BEFORE BREAKFAST, Disp: 90 tablet, Rfl: 0   Magnesium  Oxide 200 MG TABS, Take 1 tablet (200 mg total) by mouth daily., Disp: 90 tablet, Rfl: 3   metolazone  (ZAROXOLYN ) 2.5 MG tablet, TAKE 1 TABLET BY MOUTH AS DIRECTED, Disp: 5 tablet, Rfl: 0   metoprolol  succinate (TOPROL -XL) 50 MG 24 hr tablet, Take 1 tablet (50 mg total) by mouth daily. Take with or immediately following a meal., Disp: 90 tablet, Rfl: 3   nitroGLYCERIN  (NITROSTAT ) 0.4 MG SL tablet, Place 1 tablet (0.4 mg total) under the tongue every 5 (five) minutes as needed for chest pain., Disp: 75 tablet, Rfl: 0   OVER THE COUNTER MEDICATION, Take 1 capsule by mouth daily. BARIATRIC VITAMIN, Disp: , Rfl:    potassium chloride  SA (KLOR-CON  M) 20 MEQ tablet, Take 3 tablets (60 mEq total) by mouth 2 (two) times daily., Disp: 180 tablet, Rfl: 6   sacubitril -valsartan  (ENTRESTO ) 24-26 MG, Take 1 tablet by mouth 2 (two) times daily., Disp: 90 tablet, Rfl: 2   spironolactone  (ALDACTONE ) 25 MG tablet, Take 1 tablet (25 mg total) by mouth at bedtime., Disp: 90 tablet, Rfl: 3   tizanidine  (ZANAFLEX ) 2 MG capsule, Take 2 mg by mouth as needed for muscle spasms., Disp: , Rfl:    torsemide  (DEMADEX ) 20 MG tablet, Take 40 mg by mouth 2 (two) times daily., Disp: , Rfl:  Ubrogepant  (UBRELVY ) 100 MG TABS, Take 1 tablet (100 mg total) by mouth daily as needed (migraine)., Disp: 30 tablet, Rfl: 3   Vitamin D , Ergocalciferol , (DRISDOL ) 1.25 MG (50000 UNIT) CAPS capsule, Take 1 capsule by mouth once a week, Disp: 12 capsule, Rfl: 0  Past Medical History: Past Medical History:  Diagnosis Date   Acute on chronic systolic (congestive) heart failure (HCC) 04/29/2017   Acute pain of right shoulder 06/16/2017   ADHD    AICD (automatic cardioverter/defibrillator) present    Anginal pain    Anxiety    Arthritis    right shoulder    Asthma    Back pain    CHF (congestive heart  failure) (HCC)    Chronic combined systolic and diastolic heart failure (HCC) 03/05/2014   Closed low lateral malleolus fracture 10/23/2013   Constipation    Cystitis 10/21/2017   Depression    Depression with anxiety 01/20/2013   Diabetes mellitus without complication (HCC)    Diverticulosis    Dyspnea    comes and goes intermittently mostly with exertion    Dysrhythmia    Edema, lower extremity    Essential hypertension    Prev followed by [name]    Fibroid    age 48   Gallstones    Generalized abdominal cramping    History of cardiomyopathy    Hypertension    IBS (irritable bowel syndrome)    ICD (implantable cardioverter-defibrillator), single, 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 CPAP intolerance >> smothering Good tolerance of auto device (optimal pressure 12-13 on download).  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   PVC (premature ventricular contraction) 06/23/2016   Seizures (HCC)    as a child (08/03/2016)   SOB (shortness of breath)    Termination of pregnancy    due to cardiac risk   Vitamin B12 deficiency    Vitamin D  deficiency     Tobacco Use: Social History   Tobacco Use  Smoking Status Never   Passive exposure: Never  Smokeless Tobacco Never    Labs: Review Flowsheet   More data exists      Latest Ref Rng & Units 08/26/2020 12/13/2021 12/14/2021 06/11/2023 07/16/2023  Labs for ITP Cardiac and Pulmonary Rehab  Cholestrol 0 - 200 mg/dL - - - 835  -  LDL (calc) 0 - 99 mg/dL - - - 83  -  HDL-C >60.99 mg/dL - - - 32.99  -  Trlycerides 0.0 - 149.0 mg/dL - 84  - 33.9  -  Hemoglobin A1c 4.8 - 5.6 % 5.5  - - 5.5  5.2   Bicarbonate 20.0 - 28.0 mmol/L - - 24.7  - -  TCO2 22 - 32 mmol/L - - 26  - -  O2 Saturation % - - 100  - -     Exercise Target Goals: Exercise Program Goal: Individual exercise prescription set using results from initial 6 min walk test and THRR while considering  patient's activity barriers and safety.   Exercise Prescription Goal: Initial exercise prescription builds to 30-45 minutes a day of aerobic activity, 2-3 days per week.  Home exercise guidelines will be given to patient during program as part of exercise prescription that the participant will acknowledge.   Education: Aerobic Exercise: - Group verbal and visual presentation on the components of exercise prescription. Introduces F.I.T.T principle from ACSM for exercise prescriptions.  Reviews F.I.T.T. principles of aerobic exercise including progression. Written material provided at class time.   Education: Resistance Exercise: - Group verbal and visual presentation on the components of exercise prescription. Introduces F.I.T.T principle from ACSM for exercise prescriptions  Reviews F.I.T.T. principles of resistance exercise including progression. Written material provided at class time.    Education: Exercise & Equipment Safety: - Individual verbal instruction and demonstration of equipment use and safety with use of the equipment.   Education: Exercise Physiology & General Exercise Guidelines: - Group verbal and written instruction with models to review the exercise physiology of the cardiovascular system and associated critical values. Provides general exercise guidelines with  specific guidelines to those with heart or lung disease. Written material provided at class time.   Education: Flexibility, Balance, Mind/Body Relaxation: - Group verbal and visual presentation with interactive activity on the components of exercise prescription. Introduces F.I.T.T principle from ACSM for exercise prescriptions. Reviews F.I.T.T. principles of flexibility and balance exercise training including progression. Also discusses the mind body connection.  Reviews various relaxation techniques to help reduce and manage stress (i.e. Deep breathing, progressive muscle relaxation, and visualization). Balance handout provided to take home. Written material provided at class time.   Activity Barriers & Risk Stratification:  Activity Barriers & Cardiac Risk Stratification - 01/03/24 1228       Activity Barriers & Cardiac Risk Stratification   Activity Barriers Joint Problems;Deconditioning;Shortness of Breath;Decreased Ventricular Function;History of Falls;Balance Concerns    Cardiac Risk Stratification High   <5 METs on         6 Minute Walk:  6 Minute Walk     Row Name 01/03/24 1227         6 Minute Walk   Phase Initial     Distance 1265 feet     Walk Time 6 minutes     # of Rest Breaks 0     MPH 2.4     METS 3.76     RPE 12     Perceived Dyspnea  0     VO2 Peak 13.17     Symptoms No     Resting HR 86 bpm     Resting BP 132/88     Resting Oxygen  Saturation  98 %     Exercise Oxygen  Saturation  during 6 min walk 98 %     Max Ex. HR 118 bpm     Max Ex. BP 144/102  pt did not take meds due to appt prior     2 Minute Post BP 134/92        Oxygen  Initial Assessment:   Oxygen  Re-Evaluation:   Oxygen  Discharge (Final Oxygen  Re-Evaluation):   Initial Exercise Prescription:  Initial Exercise Prescription - 01/03/24 1200       Date of Initial Exercise RX and Referring Provider   Date 01/03/24  Referring Provider [name], MD    Expected  Discharge Date 03/29/24      NuStep   Level 1    SPM 70    Minutes 15    METs 2      Track   Laps 14    Minutes 15    METs 2      Prescription Details   Frequency (times per week) 3    Duration Progress to 30 minutes of continuous aerobic without signs/symptoms of physical distress      Intensity   THRR 40-80% of Max Heartrate 68-137    Ratings of Perceived Exertion 11-13    Perceived Dyspnea 0-4      Progression   Progression Continue to progress workloads to maintain intensity without signs/symptoms of physical distress.      Resistance Training   Training Prescription Yes    Weight 2    Reps 10-15          Perform Capillary Blood Glucose checks as needed.  Exercise Prescription Changes:   Exercise Prescription Changes     Row Name 01/10/24 1600 02/04/24 1400 02/18/24 1500         Response to Exercise   Blood Pressure (Admit) 122/72 102/66 108/80     Blood Pressure (Exercise) 140/82 108/78 --     Blood Pressure (Exit) 104/60 98/58 108/60     Heart Rate (Admit) 87 bpm 81 bpm 60 bpm     Heart Rate (Exercise) 135 bpm 123 bpm 89 bpm     Heart Rate (Exit) 86 bpm 90 bpm 70 bpm     Rating of Perceived Exertion (Exercise) 8 13 12      Symptoms None None None     Comments Pt's first day in the CRP2 program Reviewed METs and goals Reviewed METs     Duration Continue with 30 min of aerobic exercise without signs/symptoms of physical distress. Continue with 30 min of aerobic exercise without signs/symptoms of physical distress. Continue with 30 min of aerobic exercise without signs/symptoms of physical distress.     Intensity THRR unchanged THRR unchanged THRR unchanged       Progression   Progression Continue to progress workloads to maintain intensity without signs/symptoms of physical distress. Continue to progress workloads to maintain intensity without signs/symptoms of physical distress. Continue to progress workloads to maintain intensity without signs/symptoms  of physical distress.     Average METs 3 2.7 3.15       Resistance Training   Training Prescription Yes Yes Yes     Weight 2 lbs 2 lbs 2 lbs     Reps 10-15 10-15 10-15     Time 5 Minutes 5 Minutes 5 Minutes       Interval Training   Interval Training No No No       NuStep   Level 1 2 4      SPM 99 102 90     Minutes 15 15 15      METs 2.7 2.5 2.8       Track   Laps 18 15 17      Minutes 15 15 15      METs 3.3 2.91 3.32       Home Exercise Plan   Plans to continue exercise at -- -- --     Frequency -- -- --     Initial Home Exercises Provided -- -- --        Exercise Comments:   Exercise Comments  Row Name 01/10/24 1636 01/26/24 1500 02/04/24 1440 02/18/24 1500     Exercise Comments Pt''s first day in the CRP2 program. Pt exercised without complaints and is off to a good start. Reviewed METs today with patient. She is making good progress. Pt did not exercise due to feeling lightheaded after warmup. Reviewed METs and goals. METs slipped a bit today due to doing education during exercise. Pt has peak METs of 4.5 to date. Reviewed METs. Pt is making good progress.       Exercise Goals and Review:   Exercise Goals     Row Name 01/03/24 1230             Exercise Goals   Increase Physical Activity Yes       Intervention Develop an individualized exercise prescription for aerobic and resistive training based on initial evaluation findings, risk stratification, comorbidities and participant's personal goals.;Provide advice, education, support and counseling about physical activity/exercise needs.       Expected Outcomes Long Term: Exercising regularly at least 3-5 days a week.;Short Term: Attend rehab on a regular basis to increase amount of physical activity.;Long Term: Add in home exercise to make exercise part of routine and to increase amount of physical activity.       Increase Strength and Stamina Yes       Intervention Provide advice, education, support and  counseling about physical activity/exercise needs.;Develop an individualized exercise prescription for aerobic and resistive training based on initial evaluation findings, risk stratification, comorbidities and participant's personal goals.       Expected Outcomes Short Term: Increase workloads from initial exercise prescription for resistance, speed, and METs.;Long Term: Improve cardiorespiratory fitness, muscular endurance and strength as measured by increased METs and functional capacity ( );Short Term: Perform resistance training exercises routinely during rehab and add in resistance training at home       Able to understand and use rate of perceived exertion (RPE) scale Yes       Intervention Provide education and explanation on how to use RPE scale       Expected Outcomes Short Term: Able to use RPE daily in rehab to express subjective intensity level;Long Term:  Able to use RPE to guide intensity level when exercising independently       Knowledge and understanding of Target Heart Rate Range (THRR) Yes       Intervention Provide education and explanation of THRR including how the numbers were predicted and where they are located for reference       Expected Outcomes Long Term: Able to use THRR to govern intensity when exercising independently;Short Term: Able to state/look up THRR;Short Term: Able to use daily as guideline for intensity in rehab       Understanding of Exercise Prescription Yes       Intervention Provide education, explanation, and written materials on patient's individual exercise prescription       Expected Outcomes Short Term: Able to explain program exercise prescription;Long Term: Able to explain home exercise prescription to exercise independently          Exercise Goals Re-Evaluation :  Exercise Goals Re-Evaluation     Row Name 01/10/24 1635 02/04/24 1438           Exercise Goal Re-Evaluation   Exercise Goals Review Increase Physical Activity;Understanding of  Exercise Prescription;Increase Strength and Stamina;Knowledge and understanding of Target Heart Rate Range (THRR);Able to understand and use rate of perceived exertion (RPE) scale Increase Physical Activity;Understanding of Exercise Prescription;Increase Strength  and Stamina;Knowledge and understanding of Target Heart Rate Range (THRR);Able to understand and use rate of perceived exertion (RPE) scale      Comments Pt's first day in the CRP2 program, Pt understands the exercise Rx, RPE scale and THRR. Reviewed METs and goals. Pt voices progress on her goal of imrpoved strength and stamina. Pt voices that she can go up the stairs more easily, has more energy and is not taking naps in the afternoon anymore.      Expected Outcomes Will continue to monitor the patient and progress exercise workloads as tolerated. Will continue to monitor the patient and progress exercise workloads as tolerated.         Discharge Exercise Prescription (Final Exercise Prescription Changes):  Exercise Prescription Changes - 02/18/24 1500       Response to Exercise   Blood Pressure (Admit) 108/80    Blood Pressure (Exit) 108/60    Heart Rate (Admit) 60 bpm    Heart Rate (Exercise) 89 bpm    Heart Rate (Exit) 70 bpm    Rating of Perceived Exertion (Exercise) 12    Symptoms None    Comments Reviewed METs    Duration Continue with 30 min of aerobic exercise without signs/symptoms of physical distress.    Intensity THRR unchanged      Progression   Progression Continue to progress workloads to maintain intensity without signs/symptoms of physical distress.    Average METs 3.15      Resistance Training   Training Prescription Yes    Weight 2 lbs    Reps 10-15    Time 5 Minutes      Interval Training   Interval Training No      NuStep   Level 4    SPM 90    Minutes 15    METs 2.8      Track   Laps 17    Minutes 15    METs 3.32          Nutrition:  Target Goals: Understanding of nutrition  guidelines, daily intake of sodium 1500mg , cholesterol 200mg , calories 30% from fat and 7% or less from saturated fats, daily to have 5 or more servings of fruits and vegetables.  Education: Nutrition 1 -Group instruction provided by verbal, written material, interactive activities, discussions, models, and posters to present general guidelines for heart healthy nutrition including macronutrients, label reading, and promoting whole foods over processed counterparts. Education serves as Pensions consultant of discussion of heart healthy eating for all. Written material provided at class time.    Education: Nutrition 2 -Group instruction provided by verbal, written material, interactive activities, discussions, models, and posters to present general guidelines for heart healthy nutrition including sodium, cholesterol, and saturated fat. Providing guidance of habit forming to improve blood pressure, cholesterol, and body weight. Written material provided at class time.     Biometrics:  Pre Biometrics - 01/03/24 1220       Pre Biometrics   Waist Circumference 43 inches    Hip Circumference 49 inches    Waist to Hip Ratio 0.88 %    Triceps Skinfold 48 mm    % Body Fat 47.7 %    Grip Strength 28 kg    Flexibility 15 in    Single Leg Stand 30 seconds           Nutrition Therapy Plan and Nutrition Goals:  Nutrition Therapy & Goals - 01/10/24 1410       Nutrition Therapy   Diet Heart  Healthy Diet      Personal Nutrition Goals   Nutrition Goal Patient to identify strategies for reducing cardiovascular risk by attending the [employer] education and nutrition series weekly.    Personal Goal #2 Patient to improve diet quality by using the plate method as a guide for meal planning to include lean protein/plant protein, fruits, vegetables, whole grains, nonfat dairy as part of a well-balanced diet.    Comments [name] has medical history of OSA, HTN, NICM, CHF. She is s/p Sleeve Gastrectomy at 260#;  her lowest weight following surgery was 180# (30% total body weight loss). She is motivated to lose weight and recently started working with [facility] [employer] [facility]; per documenation on 01/03/24, they recommended an 1000kcals diet. Patient will benefit from participation in intensive cardiac rehab for nutrition education, exercise, and lifestyle modification.      Intervention Plan   Intervention Prescribe, educate and counsel regarding individualized specific dietary modifications aiming towards targeted core components such as weight, hypertension, lipid management, diabetes, heart failure and other comorbidities.;Nutrition handout(s) given to patient.    Expected Outcomes Short Term Goal: Understand basic principles of dietary content, such as calories, fat, sodium, cholesterol and nutrients.;Long Term Goal: Adherence to prescribed nutrition plan.          Nutrition Assessments:  Nutrition Assessments - 01/13/24 1109       Rate Your Plate Scores   Pre Score 61         MEDIFICTS Score Key: >=70 Need to make dietary changes  40-70 Heart Healthy Diet <= 40 Therapeutic Level Cholesterol Diet  Flowsheet Row INTENSIVE CARDIAC REHAB from 01/12/2024 in [facility]  Picture Your Plate Total Score on Admission 61   Picture Your Plate Scores: <59 Unhealthy dietary pattern with much room for improvement. 41-50 Dietary pattern unlikely to meet recommendations for good health and room for improvement. 51-60 More healthful dietary pattern, with some room for improvement.  >60 Healthy dietary pattern, although there may be some specific behaviors that could be improved.    Nutrition Goals Re-Evaluation:  Nutrition Goals Re-Evaluation     Row Name 01/10/24 1410             Goals   Current Weight 219 lb 12.8 oz (99.7 kg)       Comment A1c WNL, LDL 83, HDL 67       Expected Outcome [name] has medical history of OSA, HTN,  NICM, CHF. She is s/p Sleeve Gastrectomy at 260#; her lowest weight following surgery was 180# (30% total body weight loss). She is motivated to lose weight and recently started working with [facility] [employer] [facility]; per documenation on 01/03/24, they recommended an 1000kcals diet. Patient will benefit from participation in intensive cardiac rehab for nutrition education, exercise, and lifestyle modification.          Nutrition Goals Discharge (Final Nutrition Goals Re-Evaluation):  Nutrition Goals Re-Evaluation - 01/10/24 1410       Goals   Current Weight 219 lb 12.8 oz (99.7 kg)    Comment A1c WNL, LDL 83, HDL 67    Expected Outcome [name] has medical history of OSA, HTN, NICM, CHF. She is s/p Sleeve Gastrectomy at 260#; her lowest weight following surgery was 180# (30% total body weight loss). She is motivated to lose weight and recently started working with [facility] [employer] [facility]; per documenation on 01/03/24, they recommended an 1000kcals diet. Patient will benefit  from participation in intensive cardiac rehab for nutrition education, exercise, and lifestyle modification.          Psychosocial: Target Goals: Acknowledge presence or absence of significant depression and/or stress, maximize coping skills, provide positive support system. Participant is able to verbalize types and ability to use techniques and skills needed for reducing stress and depression.   Education: Stress, Anxiety, and Depression - Group verbal and visual presentation to define topics covered.  Reviews how body is impacted by stress, anxiety, and depression.  Also discusses healthy ways to reduce stress and to treat/manage anxiety and depression. Written material provided at class time.   Education: Sleep Hygiene -Provides group verbal and written instruction about how sleep can affect your health.  Define sleep hygiene, discuss sleep cycles and impact of sleep habits. Review good sleep hygiene  tips.   Initial Review & Psychosocial Screening:  Initial Psych Review & Screening - 01/03/24 1231       Initial Review   Current issues with Current Depression      Family Dynamics   Good Support System? Yes   husband, daughter   Comments [name] shared that she has feelings of depression regarding her health and how it has impacted her life. She feels as if she has lost friends due to her health decline and mostly stays at home with her family doing activites with them. She is highly motivated for cardiac rehab, she wants to meet new people and change her mindset to start working on herself rather than taking care of others. Resources were offered, denies any need for additional resources at this time.      Barriers   Psychosocial barriers to participate in program There are no identifiable barriers or psychosocial needs.      Screening Interventions   Interventions Encouraged to exercise;Provide feedback about the scores to participant    Expected Outcomes Long Term goal: The participant improves quality of Life and PHQ9 Scores as seen by post scores and/or verbalization of changes;Short Term goal: Identification and review with participant of any Quality of Life or Depression concerns found by scoring the questionnaire.;Long Term Goal: Stressors or current issues are controlled or eliminated.;Short Term goal: Utilizing psychosocial counselor, staff and physician to assist with identification of specific Stressors or current issues interfering with healing process. Setting desired goal for each stressor or current issue identified.          Quality of Life Scores:   Quality of Life - 01/03/24 1235       Quality of Life   Select Quality of Life      Quality of Life Scores   Health/Function Pre 17.93 %    Socioeconomic Pre 18.93 %    Psych/Spiritual Pre 14.71 %    Family Pre 28.8 %    GLOBAL Pre 19.07 %         Scores of 19 and below usually indicate a poorer quality of life  in these areas.  A difference of  2-3 points is a clinically meaningful difference.  A difference of 2-3 points in the total score of the Quality of Life Index has been associated with significant improvement in overall quality of life, self-image, physical symptoms, and general health in studies assessing change in quality of life.  PHQ-9: Review Flowsheet  More data exists      02/25/2024 01/03/2024 05/06/2023 12/25/2022 08/18/2022  Depression screen PHQ 2/9  Decreased Interest 0 2 2 0 2 3  Down, Depressed, Hopeless 0  2 2 3 3 3   PHQ - 2 Score 0 4 4 3 5 6   Altered sleeping 0 2 3 3 3 3   Tired, decreased energy 0 2 2 2 3 3   Change in appetite 0 3 2 0 2 3  Feeling bad or failure about yourself  0 3 2 2 2 2   Trouble concentrating 0 2 3 2 3 2   Moving slowly or fidgety/restless 0 2 1 0 0 1  Suicidal thoughts 0 1 0 0 1 2  PHQ-9 Score 0 19 17 12 19 22   Difficult doing work/chores Not difficult at all Somewhat difficult Somewhat difficult Very difficult Somewhat difficult    Details       Multiple values from one day are sorted in reverse-chronological order        Interpretation of Total Score  Total Score Depression Severity:  1-4 = Minimal depression, 5-9 = Mild depression, 10-14 = Moderate depression, 15-19 = Moderately severe depression, 20-27 = Severe depression   Psychosocial Evaluation and Intervention:   Psychosocial Re-Evaluation:  Psychosocial Re-Evaluation     Row Name 01/31/24 1406 02/23/24 1653           Psychosocial Re-Evaluation   Current issues with Current Depression;Current Anxiety/Panic;Current Stress Concerns Current Depression;Current Anxiety/Panic;Current Stress Concerns      Comments [name] states she continues to feel more confident in her ability to walk, climb stairs, and that her depression symptoms continue to decrease. [name] states she continues to feel more confident in her ability to walk, climb stairs, and that her depression symptoms continue to  decrease.      Expected Outcomes -- [name] will continue to exhibit a positive outlook with good coping skills.      Interventions -- Stress management education;Relaxation education;Encouraged to attend Cardiac Rehabilitation for the exercise      Continue Psychosocial Services  -- Follow up required by staff         Psychosocial Discharge (Final Psychosocial Re-Evaluation):  Psychosocial Re-Evaluation - 02/23/24 1653       Psychosocial Re-Evaluation   Current issues with Current Depression;Current Anxiety/Panic;Current Stress Concerns    Comments [name] states she continues to feel more confident in her ability to walk, climb stairs, and that her depression symptoms continue to decrease.    Expected Outcomes [name] will continue to exhibit a positive outlook with good coping skills.    Interventions Stress management education;Relaxation education;Encouraged to attend Cardiac Rehabilitation for the exercise    Continue Psychosocial Services  Follow up required by staff          Vocational Rehabilitation: Provide vocational rehab assistance to qualifying candidates.   Vocational Rehab Evaluation & Intervention:  Vocational Rehab - 01/03/24 1429       Initial Vocational Rehab Evaluation & Intervention   Assessment shows need for Vocational Rehabilitation No   disability         Education: Education Goals: Education classes will be provided on a variety of topics geared toward better understanding of heart health and risk factor modification. Participant will state understanding/return demonstration of topics presented as noted by education test scores.  Learning Barriers/Preferences:  Learning Barriers/Preferences - 01/03/24 1429       Learning Barriers/Preferences   Learning Barriers Sight    Learning Preferences Audio;Computer/Internet;Group Instruction;Individual Instruction;Skilled Demonstration;Pictoral;Verbal Instruction;Written Material;Video          General  Cardiac Education Topics:  AED/CPR: - Group verbal and written instruction with the use of models to demonstrate the basic  use of the AED with the basic ABC's of resuscitation.   Test and Procedures: - Group verbal and visual presentation and models provide information about basic cardiac anatomy and function. Reviews the testing methods done to diagnose heart disease and the outcomes of the test results. Describes the treatment choices: Medical Management, Angioplasty, or Coronary Bypass Surgery for treating various heart conditions including Myocardial Infarction, Angina, Valve Disease, and Cardiac Arrhythmias. Written material provided at class time.   Medication Safety: - Group verbal and visual instruction to review commonly prescribed medications for heart and lung disease. Reviews the medication, class of the drug, and side effects. Includes the steps to properly store meds and maintain the prescription regimen. Written material provided at class time.   Intimacy: - Group verbal instruction through game format to discuss how heart and lung disease can affect sexual intimacy. Written material provided at class time.   Know Your Numbers and Heart Failure: - Group verbal and visual instruction to discuss disease risk factors for cardiac and pulmonary disease and treatment options.  Reviews associated critical values for Overweight/Obesity, Hypertension, Cholesterol, and Diabetes.  Discusses basics of heart failure: signs/symptoms and treatments.  Introduces Heart Failure Zone chart for action plan for heart failure. Written material provided at class time.   Infection Prevention: - Provides verbal and written material to individual with discussion of infection control including proper hand washing and proper equipment cleaning during exercise session.   Falls Prevention: - Provides verbal and written material to individual with discussion of falls prevention and  safety.   Other: -Provides group and verbal instruction on various topics (see comments)   Knowledge Questionnaire Score:  Knowledge Questionnaire Score - 01/03/24 1429       Knowledge Questionnaire Score   Pre Score 24/24          Core Components/Risk Factors/Patient Goals at Admission:  Personal Goals and Risk Factors at Admission - 01/03/24 1429       Core Components/Risk Factors/Patient Goals on Admission    Weight Management Yes;Obesity;Weight Loss    Intervention Weight Management: Develop a combined nutrition and exercise program designed to reach desired caloric intake, while maintaining appropriate intake of nutrient and fiber, sodium and fats, and appropriate energy expenditure required for the weight goal.;Weight Management: Provide education and appropriate resources to help participant work on and attain dietary goals.;Weight Management/Obesity: Establish reasonable short term and long term weight goals.;Obesity: Provide education and appropriate resources to help participant work on and attain dietary goals.    Expected Outcomes Short Term: Continue to assess and modify interventions until short term weight is achieved;Long Term: Adherence to nutrition and physical activity/exercise program aimed toward attainment of established weight goal;Weight Loss: Understanding of general recommendations for a balanced deficit meal plan, which promotes 1-2 lb weight loss per week and includes a negative energy balance of [id number] kcal/d;Understanding of distribution of calorie intake throughout the day with the consumption of 4-5 meals/snacks;Understanding recommendations for meals to include 15-35% energy as protein, 25-35% energy from fat, 35-60% energy from carbohydrates, less than 200mg  of dietary cholesterol, 20-35 gm of total fiber daily    Diabetes Yes    Intervention Provide education about signs/symptoms and action to take for hypo/hyperglycemia.;Provide education about proper  nutrition, including hydration, and aerobic/resistive exercise prescription along with prescribed medications to achieve blood glucose in normal ranges: Fasting glucose 65-99 mg/dL    Expected Outcomes Short Term: Participant verbalizes understanding of the signs/symptoms and immediate care of hyper/hypoglycemia, proper foot care and  importance of medication, aerobic/resistive exercise and nutrition plan for blood glucose control.;Long Term: Attainment of HbA1C < 7%.    Heart Failure Yes    Intervention Provide a combined exercise and nutrition program that is supplemented with education, support and counseling about heart failure. Directed toward relieving symptoms such as shortness of breath, decreased exercise tolerance, and extremity edema.    Expected Outcomes Improve functional capacity of life;Short term: Attendance in program 2-3 days a week with increased exercise capacity. Reported lower sodium intake. Reported increased fruit and vegetable intake. Reports medication compliance.;Short term: Daily weights obtained and reported for increase. Utilizing diuretic protocols set by physician.;Long term: Adoption of self-care skills and reduction of barriers for early signs and symptoms recognition and intervention leading to self-care maintenance.    Hypertension Yes    Intervention Provide education on lifestyle modifcations including regular physical activity/exercise, weight management, moderate sodium restriction and increased consumption of fresh fruit, vegetables, and low fat dairy, alcohol moderation, and smoking cessation.;Monitor prescription use compliance.    Expected Outcomes Short Term: Continued assessment and intervention until BP is < 140/61mm HG in hypertensive participants. < 130/54mm HG in hypertensive participants with diabetes, heart failure or chronic kidney disease.;Long Term: Maintenance of blood pressure at goal levels.    Lipids Yes    Intervention Provide education and support  for participant on nutrition & aerobic/resistive exercise along with prescribed medications to achieve LDL 70mg , HDL >40mg .    Expected Outcomes Short Term: Participant states understanding of desired cholesterol values and is compliant with medications prescribed. Participant is following exercise prescription and nutrition guidelines.;Long Term: Cholesterol controlled with medications as prescribed, with individualized exercise RX and with personalized nutrition plan. Value goals: LDL < 70mg , HDL > 40 mg.    Stress Yes    Intervention Offer individual and/or small group education and counseling on adjustment to heart disease, stress management and health-related lifestyle change. Teach and support self-help strategies.;Refer participants experiencing significant psychosocial distress to appropriate mental health specialists for further evaluation and treatment. When possible, include family members and significant others in education/counseling sessions.    Expected Outcomes Short Term: Participant demonstrates changes in health-related behavior, relaxation and other stress management skills, ability to obtain effective social support, and compliance with psychotropic medications if prescribed.;Long Term: Emotional wellbeing is indicated by absence of clinically significant psychosocial distress or social isolation.          Education:Diabetes - Individual verbal and written instruction to review signs/symptoms of diabetes, desired ranges of glucose level fasting, after meals and with exercise. Acknowledge that pre and post exercise glucose checks will be done for 3 sessions at entry of program.   Core Components/Risk Factors/Patient Goals Review:   Goals and Risk Factor Review     Row Name 01/31/24 1414 02/23/24 1652           Core Components/Risk Factors/Patient Goals Review   Personal Goals Review Weight Management/Obesity;Diabetes;Heart Failure;Hypertension;Lipids;Stress Weight  Management/Obesity;Diabetes;Heart Failure;Hypertension;Lipids;Stress      Review [name] is doing well with exercise at cardiac rehab. Vital signs have been stable. [name] has increased her met levels. [name] is doing okay with exercise at cardiac rehab, but her progress is hindered by chronic left knee pain. VSS. [name] has maintained her met levels for the last 30 days.      Expected Outcomes [name] will continue to participate in cardiac rehab for exercise, nutrtion and lifestyle modifications. [name] will continue to participate in cardiac rehab for exercise, nutrtion and lifestyle modifications.  Core Components/Risk Factors/Patient Goals at Discharge (Final Review):   Goals and Risk Factor Review - 02/23/24 1652       Core Components/Risk Factors/Patient Goals Review   Personal Goals Review Weight Management/Obesity;Diabetes;Heart Failure;Hypertension;Lipids;Stress    Review [name] is doing okay with exercise at cardiac rehab, but her progress is hindered by chronic left knee pain. VSS. [name] has maintained her met levels for the last 30 days.    Expected Outcomes [name] will continue to participate in cardiac rehab for exercise, nutrtion and lifestyle modifications.          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 Introduction to pritikin education/intensive cardiac rehab. Initial orientation packet reviewed with patient. 30 Day ITP Review. [name] started cardiac rehab on 01/10/24. [name] did well with exercise. 30 Day ITP Review. [name] started cardiac rehab on 01/10/24 and has done well with exercise when in attendance 30 Day ITP Review. [name]'s progress with exercise at cardiac rehab is somewhat hindered by chronic left knee pain.       Comments: see ITP comments

## 2024-02-29 NOTE — Telephone Encounter (Signed)
 Patient is apart of [facility] Lead advisory. Programmed INITIAL and at [facility].

## 2024-02-29 NOTE — Telephone Encounter (Signed)
Scheduled for tomorrow at 11:15 am

## 2024-02-29 NOTE — Progress Notes (Signed)
 Called to confirm/remind patient of their appointment at [facility] on 02/29/2024.   Appointment:   [x] Confirmed  [] Left mess   [] No answer/No voice mail  [] VM Full/unable to leave message  [] Phone not in service  Patient reminded to bring all medications and/or complete list.  Confirmed patient has transportation. Gave directions, instructed to utilize valet parking.

## 2024-03-01 ENCOUNTER — Other Ambulatory Visit: Payer: Self-pay | Admitting: Internal Medicine

## 2024-03-01 ENCOUNTER — Ambulatory Visit (HOSPITAL_COMMUNITY)
Admission: RE | Admit: 2024-03-01 | Discharge: 2024-03-01 | Disposition: A | Discharge status: Home / Self Care | Source: Ambulatory Visit | Attending: Physician Assistant | Admitting: Physician Assistant

## 2024-03-01 ENCOUNTER — Encounter (HOSPITAL_COMMUNITY): Payer: Self-pay

## 2024-03-01 ENCOUNTER — Ambulatory Visit (HOSPITAL_COMMUNITY): Payer: Self-pay | Admitting: Physician Assistant

## 2024-03-01 ENCOUNTER — Encounter (HOSPITAL_COMMUNITY)
Admission: RE | Admit: 2024-03-01 | Discharge: 2024-03-01 | Disposition: A | Discharge status: Home / Self Care | Source: Ambulatory Visit | Attending: Internal Medicine | Admitting: Internal Medicine

## 2024-03-01 ENCOUNTER — Ambulatory Visit (HOSPITAL_COMMUNITY)

## 2024-03-01 VITALS — BP 110/80 | HR 94 | Ht 65.0 in | Wt 205.2 lb

## 2024-03-01 DIAGNOSIS — Z6834 Body mass index (BMI) 34.0-34.9, adult: Secondary | ICD-10-CM

## 2024-03-01 DIAGNOSIS — E6609 Other obesity due to excess calories: Secondary | ICD-10-CM

## 2024-03-01 DIAGNOSIS — I428 Other cardiomyopathies: Secondary | ICD-10-CM | POA: Diagnosis not present

## 2024-03-01 DIAGNOSIS — R008 Other abnormalities of heart beat: Secondary | ICD-10-CM | POA: Diagnosis not present

## 2024-03-01 DIAGNOSIS — I5022 Chronic systolic (congestive) heart failure: Secondary | ICD-10-CM | POA: Diagnosis not present

## 2024-03-01 DIAGNOSIS — I420 Dilated cardiomyopathy: Secondary | ICD-10-CM

## 2024-03-01 DIAGNOSIS — Z86718 Personal history of other venous thrombosis and embolism: Secondary | ICD-10-CM | POA: Diagnosis not present

## 2024-03-01 DIAGNOSIS — F32A Depression, unspecified: Secondary | ICD-10-CM | POA: Diagnosis not present

## 2024-03-01 DIAGNOSIS — I110 Hypertensive heart disease with heart failure: Secondary | ICD-10-CM | POA: Diagnosis not present

## 2024-03-01 DIAGNOSIS — I5042 Chronic combined systolic (congestive) and diastolic (congestive) heart failure: Secondary | ICD-10-CM

## 2024-03-01 DIAGNOSIS — F419 Anxiety disorder, unspecified: Secondary | ICD-10-CM | POA: Diagnosis not present

## 2024-03-01 DIAGNOSIS — N179 Acute kidney failure, unspecified: Secondary | ICD-10-CM | POA: Diagnosis not present

## 2024-03-01 DIAGNOSIS — G4733 Obstructive sleep apnea (adult) (pediatric): Secondary | ICD-10-CM | POA: Diagnosis not present

## 2024-03-01 DIAGNOSIS — Z79899 Other long term (current) drug therapy: Secondary | ICD-10-CM | POA: Diagnosis not present

## 2024-03-01 DIAGNOSIS — E119 Type 2 diabetes mellitus without complications: Secondary | ICD-10-CM | POA: Insufficient documentation

## 2024-03-01 DIAGNOSIS — I493 Ventricular premature depolarization: Secondary | ICD-10-CM | POA: Diagnosis not present

## 2024-03-01 DIAGNOSIS — R5383 Other fatigue: Secondary | ICD-10-CM | POA: Insufficient documentation

## 2024-03-01 DIAGNOSIS — Z95810 Presence of automatic (implantable) cardiac defibrillator: Secondary | ICD-10-CM | POA: Insufficient documentation

## 2024-03-01 DIAGNOSIS — E66811 Obesity, class 1: Secondary | ICD-10-CM | POA: Diagnosis not present

## 2024-03-01 DIAGNOSIS — Z903 Acquired absence of stomach [part of]: Secondary | ICD-10-CM | POA: Diagnosis not present

## 2024-03-01 LAB — BASIC METABOLIC PANEL WITH GFR
Anion gap: 10 (ref 5–15)
BUN: 23 mg/dL — ABNORMAL HIGH (ref 6–20)
CO2: 29 mmol/L (ref 22–32)
Calcium: 9.8 mg/dL (ref 8.9–10.3)
Chloride: 100 mmol/L (ref 98–111)
Creatinine, Ser: 0.97 mg/dL (ref 0.44–1.00)
GFR, Estimated: >60 mL/min (ref >60)
Glucose, Bld: 105 mg/dL — ABNORMAL HIGH (ref 70–99)
Potassium: 4.3 mmol/L (ref 3.5–5.1)
Sodium: 139 mmol/L (ref 135–145)

## 2024-03-01 LAB — BRAIN NATRIURETIC PEPTIDE: B Natriuretic Peptide: 48.2 pg/mL (ref 0.0–100.0)

## 2024-03-01 MED ORDER — TORSEMIDE 20 MG PO TABS: 20.0000 mg | ORAL_TABLET | Freq: Every day | ORAL | Status: DC | PRN

## 2024-03-01 MED ORDER — IVABRADINE HCL 7.5 MG PO TABS: 7.5000 mg | ORAL_TABLET | Freq: Two times a day (BID) | ORAL | Status: DC

## 2024-03-01 MED ORDER — LOSARTAN POTASSIUM 25 MG PO TABS: 12.5000 mg | ORAL_TABLET | Freq: Every day | ORAL | 3 refills | Status: DC

## 2024-03-01 NOTE — Telephone Encounter (Signed)
 Encounter closed

## 2024-03-01 NOTE — Addendum Note (Signed)
 Encounter addended by: Colletta Manuelita Garre, PA-C on: 03/01/2024 1:01 PM  Actions taken: Clinical Note Signed

## 2024-03-01 NOTE — Telephone Encounter (Signed)
 Discussed with Dr. Waddell. Per Dr. Waddell, schedule Pt to see him to discuss [employer] lead advisory.  Will send message to scheduler.

## 2024-03-01 NOTE — Patient Instructions (Addendum)
 Medication Changes:  RESTART Ivabradine  (Corlanor ) 7.5 mg (1 tab) Twice daily   START Losartan  12.5 mg (1/2 tab) Daily AT BEDTIME  RESTART Torsemide  20 mg AS NEEDED for weight gain, swelling  Continue to stay off all other heart medications for now (Entresto , Metoprolol  XL, Spironolactone , Jardiance , Metolazone , Potassium, and Mag-ox)  Lab Work:  Labs done today, your results will be available in MyChart, we will contact you for abnormal readings.  Testing/Procedures:  Your physician has requested that you have an echocardiogram. Echocardiography is a painless test that uses sound waves to create images of your heart. It provides your doctor with information about the size and shape of your heart and how well your heart's chambers and valves are working. This procedure takes approximately one hour. There are no restrictions for this procedure. Please do NOT wear cologne, perfume, aftershave, or lotions (deodorant is allowed). Please arrive 15 minutes prior to your appointment time.  Please note: We ask at that you not bring children with you during ultrasound (echo/ vascular) testing. Due to room size and safety concerns, children are not allowed in the ultrasound rooms during exams. Our front office staff cannot provide observation of children in our lobby area while testing is being conducted. An adult accompanying a patient to their appointment will only be allowed in the ultrasound room at the discretion of the ultrasound technician under special circumstances. We apologize for any inconvenience.   Special Instructions // Education:  Do the following things EVERYDAY: Weigh yourself in the morning before breakfast. Write it down and keep it in a log. Take your medicines as prescribed Eat low salt foods--Limit salt (sodium) to 2000 mg per day.  Stay as active as you can everyday Limit all fluids for the day to less than 2 liters   Follow-Up in:   Please follow up with our heart  failure pharmacist in 2 weeks  Your physician recommends that you schedule a follow-up appointment in: 4 weeks    At the [facility], you and your health needs are our priority. We have a designated team specialized in the treatment of Heart Failure. This Care Team includes your primary Heart Failure Specialized Cardiologist (physician), Advanced Practice Providers (APPs- Physician Assistants and Nurse Practitioners), and Pharmacist who all work together to provide you with the care you need, when you need it.   You may see any of the following providers on your designated Care Team at your next follow up:  Dr. [name] Dr. Ezra Shuck Dr. Ria Commander Dr. Odis Brownie Greig Mosses, NP [place]affie Shed, [place] [facility] [place], [place] Beckey Coe, NP [place] Lee, NP Tinnie Redman, PharmD   Please be sure to bring in all your medications bottles to every appointment.   Need to Contact Us :  If you have any questions or concerns before your next appointment please send us  a message through [place] or call our office at [phone].    TO LEAVE A MESSAGE FOR THE NURSE SELECT OPTION 2, PLEASE LEAVE A MESSAGE INCLUDING: YOUR NAME DATE OF BIRTH CALL BACK NUMBER REASON FOR CALL**this is important as we prioritize the call backs  YOU WILL RECEIVE A CALL BACK THE SAME DAY AS LONG AS YOU CALL BEFORE 4:00 PM

## 2024-03-01 NOTE — Progress Notes (Addendum)
 Advanced Heart Failure Clinic Note   PCP: Rollene Almarie LABOR, MD Primary Cardiologist: [name], MD (Inactive)  HF Cardiologist: Dr. Cherrie   HPI: [name] is a 48 y.o. 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   Admitted 7/19 with CP Symptoms thought to be related to frequent PVCs. EP consulted and scheduled her for PVC ablation. Echo repeated 11/22/17 and showed improved EF 50-55%. S/p  PVC ablation 12/01/17. Was felt not to be successful.  Was on flecainide  but discontinued due to reduced EF. Placed on amio   Echo 6/20 showed drop in EF back down to 25-30%. Was instructed to f/u in clinic but pt did not. She had outpatient monitor 04/2018 that showed rare PVCs.    Admitted 6/21 with chest pain and shortness of breath. ECHO EF < 20% and normal RV. Had cath with normal cors and preserved cardiac output. Plan was to f/u with EP regarding PVCs. She has remained on AAD therapy w/ amiodarone  + ? blocker therapy.  Admitted to [facility] 4/22 w/ increased dyspnea and volume overload. Also w/ increased PVC burden. Echo showed EF 20-25%, RV normal. She was diuresed w/ IV Lasix  and placed on amiodarone  gtt for PVC suppression. Seen by Dr. Waddell, due to the location of her PVCs, she is not a candidate for re-do ablation.   S/p Barostim 6/22.  Zio 8/22: 13% PVCs (unifocal)  Seen by EP 12/22 and Barostim titrated. Not felt to have any other options for PVCs.   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  -> mexilitene increased to 300 bid  Cardiac PET 6/24 no ischemia/infarction. No active myocardial inflammation/ evidence of sarcoidosis. 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   Follow up with Dr. Waddell 6/25, Zio placed to re-quantify PVC burden. If > 20%, plan repeat PVC ablation. If < 10%, then medical Rx. If 10-20%, would discuss options.   Repeat Zio showed 25% PVC burden. => S/p PVC ablation 7/25 at [facility] with Dr. Sherrye.  CPX 7/25 showed moderate function limitation due to HF and obesity.  Post ablation 4 day Zio (9/25) showed mostly NSR, 7.4% PVC burden  Seen for follow-up last week. She was hypotensive and dizzy. Entresto  reduced to 24/26 mg BID. She was seen in the ED a few days later with hypotension and near syncope. Orthostatics were positive. All GDMT was held and given IVF. K was 2.9, aggressively supplemented. She was discharged home.  Here today for post ED follow-up for hypotension. Feeling better off HF medications. Has more energy. No recurrent presyncope. Exercising most days and participating in cardiac rehab 3 days a week. No exertional dyspnea, orthopnea, PND or lower extremity edema. SBP has been mostly > 100 since ED visit, one reading in 90s.  [facility]. Reports she has lost 20 lb. Has made significant changes to her diet in recent months.   Cardiac Studies  - CPX 7/25: moderate functional limitation 2/2 obesity and HF; pre exercise spirometry suggestive of mild restriction Peak VO2: 11.2 57% predicted peak VO2  Peak VO2  2021: 17.1 (83% of predicted)  VE/VCO2 slope:  31   - Cardiac PET (6/24):    FDG uptake was not observed. LV perfusion is normal. There is no evidence of ischemia. There is no evidence of infarction.   Coronary calcium was absent on the attenuation correction CT images.   FDG uptake findings are inconsistent with active myocardial inflammation/sarcoidosis.   No gated images or EF due to interference from AICD leads  - Echo (7/23): EF 25-30%  - Echo (1/23): EF 25-30% RV normal   - Echo (4/22): EF 20-25%, RV normal   - PFTs w/  DLOC (4/22) Mild Restriction Normal Diffusion  - CPX (7/21) FVC 2.32 (70%)      FEV1 1.99 (74%)        FEV1/FVC 86 (105%)        MVV 110 (105%) Resting HR: 94 Peak HR: 175   (100% age predicted max HR) BP rest: 126/82 BP peak: 202/78 Peak VO2: 17.6 (86% predicted peak VO2) - corrects to 28.4 for ibw VE/VCO2 slope:  37 OUES: 1.74 Peak RER: 1.08 Ventilatory Threshold: 14.1 (69% predicted or measured peak VO2) VE/MVV:  53% PETCO2 at peak:   O2pulse:  10   (83% predicted O2pulse)  - RHC (6/21):   RA = 4 RV = 32/6 PA = 31/8 (21) PCW = 12 Fick cardiac output/index = 7.0/3.4 Thermo CO/CI = 5.2/2.5 PVR =1.7 (Thermo) Ao sat = 98% PA sat = 67%, 69%  - Echo (6/21): EF < 20% RV normal   - Echo (6/20): EF 25-30%  - Echo (7/19): EF 50-55%  - CPX (12/18): pVO2 14.0 (corrects to 23.0 for IBW) VeVCO2  32 RER 1.0  - Echo (8/18): EF 25%   - CPX (2/18): FVC 2.57 (79%)      FEV1 2.14 (81%)        FEV1/FVC 83 (101%)        MVV 107 (102%) Resting HR: 106 Peak HR: 166   (93% age predicted max HR) BP rest: 136/98 BP peak: 174/88 Peak VO2: 17.1 (85% predicted peak VO2) - corrects to 28.4 for ibw VE/VCO2 slope:  33 OUES: 2.16 Peak RER: 1.09 Ventilatory Threshold: 14.6 (73% predicted or measured peak VO2) VE/MVV:  59% PETCO2 at peak:  33 O2pulse:  10   (83% predicted O2pulse)  - Echo (12/17) 20-25%  - Cath (6/17): revealed normal vessels, moderate elevation in pulmonary artery pressures, and LVEF 25-30%.  - Echo (6/17): 25-30%  - Echo (6/15): EF 35-40%   Review of systems complete and found to be negative unless listed in HPI.    Past Medical History:  Diagnosis Date   Acute on chronic systolic (congestive) heart failure (HCC) 04/29/2017   Acute pain of right shoulder 06/16/2017   ADHD    AICD (automatic cardioverter/defibrillator) present    Anginal pain    Anxiety    Arthritis    right shoulder    Asthma    Back pain    CHF (congestive heart failure) (HCC)     Chronic combined systolic and diastolic heart failure (HCC) 03/05/2014   Closed low lateral malleolus fracture 10/23/2013   Constipation    Cystitis 10/21/2017   Depression    Depression with anxiety 01/20/2013   Diabetes mellitus without complication (HCC)    Diverticulosis    Dyspnea    comes and goes intermittently mostly with exertion    Dysrhythmia    Edema, lower extremity    Essential hypertension  Prev followed by [name]    Fibroid    age 18   Gallstones    Generalized abdominal cramping    History of cardiomyopathy    Hypertension    IBS (irritable bowel syndrome)    ICD (implantable cardioverter-defibrillator), single, 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 CPAP intolerance >> smothering Good tolerance of auto device (optimal pressure 12-13 on download).  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     albuterol  (VENTOLIN  HFA) 108 (90 Base) MCG/ACT inhaler Inhale 1-2 puffs into the lungs every 6 (six) hours as needed for wheezing or shortness of breath. 18 g 0   budesonide -formoterol  (SYMBICORT ) 160-4.5 MCG/ACT inhaler Inhale 2 puffs into the lungs 2 (two) times daily as needed. 10 g 3   diclofenac  (VOLTAREN ) 75 MG EC tablet Take 1 tablet by mouth twice daily as needed 60 tablet 0   dicyclomine  (BENTYL ) 20 MG tablet Take 1 tablet (20 mg total) by mouth daily as needed for spasms. 90 tablet 3   fluticasone  (FLONASE ) 50 MCG/ACT nasal spray Place 2 sprays into both nostrils daily as needed for allergies. 16 g 3   gabapentin  (NEURONTIN ) 300 MG capsule Take 1 capsule (300 mg total) by mouth at bedtime as needed (back spasms). 90 capsule 3   ipratropium (ATROVENT ) 0.02 % nebulizer solution Inhale 500 mcg into the lungs.     ipratropium-albuterol  (DUONEB) 0.5-2.5 (3) MG/3ML SOLN USE 1 AMPULE IN NEBULIZER EVERY 6 HOURS AS NEEDED 360 mL 0   nitroGLYCERIN  (NITROSTAT ) 0.4 MG SL tablet Place 1 tablet (0.4 mg total) under the tongue every 5 (five) minutes as needed for chest pain. 75 tablet 0   OVER THE COUNTER MEDICATION Take 1 capsule by mouth daily. BARIATRIC VITAMIN     tizanidine  (ZANAFLEX ) 2 MG capsule Take 2 mg by mouth as needed for muscle spasms.     Ubrogepant  (UBRELVY ) 100 MG TABS Take 1 tablet (100 mg total) by mouth daily as needed (migraine). 30 tablet 3   Vitamin D , Ergocalciferol , (DRISDOL ) 1.25 MG (50000 UNIT) CAPS capsule Take 1 capsule by mouth once a week 12 capsule 0   ivabradine  (CORLANOR ) 7.5 MG TABS tablet Take 1 tablet (7.5 mg total) by mouth 2 (two) times daily with a meal.  No current facility-administered medications for this encounter.   Allergies  Allergen Reactions   Vancomycin  Other (See Comments)    did something to my kidneys, PROGRESSED TO KIDNEY FAILURE!!   Aspirin  Other (See Comments)    Wheezing, (Pt states that she just wheezes some when she  takes aspirin  by itself but she can take aspirin  in a combination product).   Contrast Media [Iodinated Contrast Media] Other (See Comments)    Multiple CT contrast studies done over 2 weeks caused ARF   Ciprofloxacin Itching and Rash   Cyclobenzaprine  Other (See Comments)    Can not tolerate   Farxiga  [Dapagliflozin] Rash   Sulfa Antibiotics Itching and Rash   Social History   Socioeconomic History   Marital status: Married    Spouse name: Beryl   Number of children: 1   Years of education: Not on file   Highest education level: Associate degree: academic program  Occupational History   Occupation: stay at home mom  Tobacco Use   Smoking status: Never    Passive exposure: Never   Smokeless tobacco: Never  Vaping Use   Vaping status: Never Used  Substance and Sexual Activity   Alcohol use: No   Drug use: No   Sexual activity: Yes    Birth control/protection: Surgical    Comment: BTL, Ablation  Other Topics Concern   Not on file  Social History Narrative   Lives with husband, daughter niece and great nephew   Social Drivers of Corporate investment banker Strain: Low Risk  (02/25/2024)   Overall Financial Resource Strain (CARDIA)    Difficulty of Paying Living Expenses: Not very hard  Food Insecurity: No Food Insecurity (02/25/2024)   Hunger Vital Sign    Worried About Running Out of Food in the Last Year: Never true    Ran Out of Food in the Last Year: Never true  Transportation Needs: No Transportation Needs (02/25/2024)   PRAPARE - Administrator, Civil Service (Medical): No    Lack of Transportation (Non-Medical): No  Physical Activity: Sufficiently Active (02/25/2024)   Exercise Vital Sign    Days of Exercise per Week: 5 days    Minutes of Exercise per Session: 90 min  Stress: Stress Concern Present (02/25/2024)   [employer] of Occupational Health - Occupational Stress Questionnaire    Feeling of Stress: Very much  Social Connections: Socially  Isolated (02/25/2024)   Social Connection and Isolation Panel    Frequency of Communication with Friends and Family: Never    Frequency of Social Gatherings with Friends and Family: Never    Attends Religious Services: Never    Database administrator or Organizations: No    Attends Banker Meetings: Never    Marital Status: Married  Catering manager Violence: Not At Risk (02/25/2024)   Humiliation, Afraid, Rape, and Kick questionnaire    Fear of Current or Ex-Partner: No    Emotionally Abused: No    Physically Abused: No    Sexually Abused: No   Family History  Problem Relation Age of Onset   Rheum arthritis Mother    Obesity Mother    Emphysema Maternal Grandmother        smoked   Heart disease Maternal Grandmother 3       MI   Allergies Daughter    Colon cancer Neg Hx    BP 110/80   Pulse 94   Ht 5' 5 (1.651 m)   Wt 93.1  kg (205 lb 3.2 oz)   SpO2 98%   BMI 34.15 kg/m   Wt Readings from Last 3 Encounters:  03/01/24 93.1 kg (205 lb 3.2 oz)  02/25/24 91.5 kg (201 lb 12.8 oz)  02/23/24 93.6 kg (206 lb 6.4 oz)   PHYSICAL EXAM: General:  Well appearing.  Cor: JVP flat. Regular rate & rhythm. No murmurs. Lungs: clear Abdomen: soft, nontender, nondistended.  Extremities: no edema Neuro: alert & orientedx3. Affect pleasant   Device interrogation (personally reviewed): HL Index is 0, ? Activity level 0.2 hrs/day, mean HR 80 bpm  ASSESSMENT & PLAN:  1. Chronic systolic HF in setting of TTN CM:   - due to TTN CM, onset 1999.  - S/P [employer] ICD 07/2016 - s/p Barostim 6/22 - LHC 6/17 No CAD - Echo 12/17 EF 20-25% - Echo 8/18 EF 25-30% s/p ICD 3/18.  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 Suspect progressive course. Has been referred to Dr. Joseph>>Variant is interpreted as variant  of unknown clinical significance. Per Dr. Thera, as there are no affected family members to determine if this variant segregates with disease, genetic testing her daughter for this familial TTN variant is unwarranted  - Cardiac PET 6/24: no ischemia/infarction. No active myocardial inflammation/ evidence of sarcoidosis - Unclear if PVCs are a result of her CM or contributing to it (or both). Failed previous PVC ablation. C/w high burden despite 2 AAD therapy regimen (amiodarone  and mexiletine), s/p ablation 7/25.  - NYHA I-II, volume okay on exam and by device. Recently volume depleted. Use 20 mg Torsemide  just as needed for now (previously on 20 mg BID). - Off all GDMT with recent episodes of presyncope, orthostasis and AKI. She is not orthostatic today. - Continue to hold jardiance , spiro, toprol  XL and entresto  - Restart corlanor  7.5 mg BID - Start losartan  12.5 mg at bedtime - Now that PVCs are better suppressed will obtain echo to reassess LV function - Suspect she may need advanced therapies in future but currently too early.  - CPX 7/25 with moderate limitation due to HF and obesity - Can resume cardiac rehab  2. Frequent PVCs/Bigeminy - Holter Monitor 6/19 with 30% PVCs with one primary morphology.  - S/p PVC ablation 11/2017 with Dr Waddell unsuccessful - Off flecainide  with EF down and persistent PVCs.  - Zio Patch 04/2019 > 17% PVCs and >8% couplets.  - Zio Patch 11/28/2019 8.5 % PVCs - Zio 9/22 13% PVCs (unifocal)  - Zio 12/23 showed frequent NSVT, 13% PVC burden (see discussion above) - Cardiac PET 6/24 negative for sarcoid  - Saw Dr. Waddell (EP). Zio showed 25% PVCR burden - s/p PVC ablation 7/25 - Zio 9/25 showed PVC burden down to 7.4% - Now off amio and mexiletine   3. Obesity - s/p gastric Sleeve at [facility] 09/20/17  - Body mass index is 34.15 kg/m. - Working with healthy weight and wellness - Congratulated on weight loss efforts so far   4. Daytime Fatigue -  Sleep study in 2019 and again in 2021 were both negative for sleep apnea  - Recently improved  5. DM 2 - Off Jardiance  as above - Per PCP  6. AKI - Scr baseline 0.8, recently up to 1.7 - suspect d/t volume depletion in setting of diuretics/HF  GDMT - Received IVF in ED. Now off most meds. Check labs today  Follow up 2 weeks PharmD for medication titration, 4 weeks APP  Lennette Fader N, PA-C  12:01 PM

## 2024-03-01 NOTE — Addendum Note (Signed)
 Encounter addended by: Colletta Manuelita Garre, PA-C on: 03/01/2024 1:02 PM  Actions taken: Clinical Note Signed

## 2024-03-02 ENCOUNTER — Encounter: Payer: Self-pay | Admitting: Student

## 2024-03-02 NOTE — Telephone Encounter (Signed)
 Follow up scheduled

## 2024-03-02 NOTE — Telephone Encounter (Signed)
 Spoke w/ patient - she is scheduled with Dr. Waddell on 10/23.

## 2024-03-03 ENCOUNTER — Encounter (HOSPITAL_COMMUNITY)
Admission: RE | Admit: 2024-03-03 | Discharge: 2024-03-03 | Disposition: A | Discharge status: Home / Self Care | Source: Ambulatory Visit | Attending: Internal Medicine | Admitting: Internal Medicine

## 2024-03-03 DIAGNOSIS — I5042 Chronic combined systolic (congestive) and diastolic (congestive) heart failure: Secondary | ICD-10-CM

## 2024-03-05 DIAGNOSIS — I10 Essential (primary) hypertension: Secondary | ICD-10-CM | POA: Diagnosis not present

## 2024-03-05 DIAGNOSIS — G4733 Obstructive sleep apnea (adult) (pediatric): Secondary | ICD-10-CM | POA: Diagnosis not present

## 2024-03-06 ENCOUNTER — Ambulatory Visit (INDEPENDENT_AMBULATORY_CARE_PROVIDER_SITE_OTHER): Admitting: Internal Medicine

## 2024-03-06 ENCOUNTER — Encounter (HOSPITAL_COMMUNITY)
Admission: RE | Admit: 2024-03-06 | Discharge: 2024-03-06 | Disposition: A | Source: Ambulatory Visit | Attending: Internal Medicine

## 2024-03-06 ENCOUNTER — Encounter (INDEPENDENT_AMBULATORY_CARE_PROVIDER_SITE_OTHER): Payer: Self-pay | Admitting: Internal Medicine

## 2024-03-06 ENCOUNTER — Encounter: Payer: Self-pay | Admitting: Internal Medicine

## 2024-03-06 VITALS — BP 117/79 | HR 65 | Temp 97.9°F | Ht 65.0 in | Wt 202.0 lb

## 2024-03-06 DIAGNOSIS — E119 Type 2 diabetes mellitus without complications: Secondary | ICD-10-CM

## 2024-03-06 DIAGNOSIS — I5042 Chronic combined systolic (congestive) and diastolic (congestive) heart failure: Secondary | ICD-10-CM

## 2024-03-06 DIAGNOSIS — G4733 Obstructive sleep apnea (adult) (pediatric): Secondary | ICD-10-CM

## 2024-03-06 DIAGNOSIS — E66812 Obesity, class 2: Secondary | ICD-10-CM

## 2024-03-06 DIAGNOSIS — Z9884 Bariatric surgery status: Secondary | ICD-10-CM

## 2024-03-06 DIAGNOSIS — Z6835 Body mass index (BMI) 35.0-35.9, adult: Secondary | ICD-10-CM | POA: Diagnosis not present

## 2024-03-06 NOTE — Assessment & Plan Note (Signed)
 Weight: decrease of 20 lb (9%) over 3 months, 1 week  Start: 11/24/2023 222 lb (100.7 kg)  End: 03/06/2024 202 lb (91.6 kg)   Weight maintenance despite significant physical activity and dietary adherence. Possible water retention due to recent cardiac medication changes. . - Continue current exercise regimen and dietary tracking - Monitor body composition changes rather than focusing solely on scale weight - Encourage moderate intensity exercise to avoid training plateau if medically cleared by cardiology - Reassess in three weeks to evaluate progress and adjust plan as needed

## 2024-03-06 NOTE — Assessment & Plan Note (Signed)
 Combined systolic and diastolic heart failure with reduced ejection fraction Combined systolic and diastolic heart failure with reduced ejection fraction (20-25%). Recent cardiac ablation on December 03, 2023, for PVCs, resulting in reduced PVCs. Current medications include metoprolol , spironolactone , Entresto , loop diuretic and Jardiance . Fluid status is well-managed post-ablation.  She recently went to the hospital with a near syncopal event and her medications have been held. - Follow-up with echocardiogram - Follow-up with pharmacist to schedule - Medications to be gradually reinitiated by cardiac team

## 2024-03-06 NOTE — Progress Notes (Signed)
 Office: [phone]  /  Fax: [phone]  Weight Summary and Body Composition Analysis (BIA)  Vitals Temp: 97.9 F (36.6 C) BP: 117/79 Pulse Rate: 65 SpO2: 98 %   Anthropometric Measurements Height: 5' 5 (1.651 m) Weight: 202 lb (91.6 kg) BMI (Calculated): 33.61 Weight at Last Visit: 202 lb Weight Lost Since Last Visit: 0 lb Weight Gained Since Last Visit: 0lb Starting Weight: 211 lb Total Weight Loss (lbs): 9 lb (4.082 kg)   Body Composition  Body Fat %: 42.9 % Fat Mass (lbs): 86.8 lbs Muscle Mass (lbs): 109.8 lbs Total Body Water (lbs): 76.2 lbs Visceral Fat Rating : 11    RMR: 1570  Today's Visit #: 4  Starting Date: 01/03/24   Subjective   Chief Complaint: Obesity  Interval History Discussed the use of AI scribe software for clinical note transcription with the patient, who gave verbal consent to proceed.  History of Present Illness [name] is a 48 year old female who presents for weight management.  Since last office visit she has maintained.  She maintains her weight despite engaging in significant physical activity, including walking, pool exercises, and playing pickleball. She experienced groin pain a week after her last visit, which led to a gynecological evaluation and a hernia check. The pain persists but has improved.  Most of her cardiac medications were discontinued due to low blood pressure and heart rate. Previously, her resting heart rate was consistently high, but now it remains low even during physical activity. She no longer experiences dizzy or fainting spells since the medication adjustment.  She had presented to the emergency room recently with near syncope.   She was previously on torsemide , spironolactone , Entresto , Jardiance  and metolazone , which have been discontinued. She monitors her weight and takes torsemide  if she notices swelling or weight gain.  She notes changes in her body composition, with clothes  fitting looser, despite weight neutrality on the scale. She tracks her nutrition, consuming around 1000 calories daily, and aims for 90-100 grams of protein, though her appetite is low.  She experiences chronic constipation Metamucil tablets, and occasionally magnesium  citrate. She takes vitamin D  weekly and diclofenac  for knee pain. She has not used Zanaflex  recently due to improved mobility.  She is scheduled for an echocardiogram in the near future and also has a follow-up with pharmacist.     Challenges affecting patient progress: medical comorbidities.    Pharmacotherapy for weight management: She is currently taking no anti-obesity medication.   Assessment and Plan   Treatment Plan For Obesity:  Recommended Dietary Goals  [name] is currently in the action stage of change. As such, her goal is to continue weight management plan. She has agreed to: continue current plan  Behavioral Health and Counseling  We discussed the following behavioral modification strategies today: increasing lean protein intake to established goals, continue to work on maintaining a reduced calorie state, getting the recommended amount of protein, incorporating whole foods, making healthy choices, staying well hydrated and practicing mindfulness when eating., and increase protein intake, fibrous foods (25 grams per day for women, 30 grams for men) and water to improve satiety and decrease hunger signals. .  Additional education and resources provided today: None  Recommended Physical Activity Goals  [name] has been advised to work up to 150 minutes of moderate intensity aerobic activity a week and strengthening exercises 2-3 times per week for cardiovascular health, weight loss maintenance and preservation of muscle mass.  She has agreed to :  Continue current level of physical activity   Medical Interventions and Pharmacotherapy  We discussed various medication options to help [name] with her weight  loss efforts and we both agreed to : Continue with current nutritional and behavioral strategies  Associated Conditions Impacted by Obesity Treatment  Assessment & Plan Chronic combined systolic and diastolic heart failure (HCC) Combined systolic and diastolic heart failure with reduced ejection fraction Combined systolic and diastolic heart failure with reduced ejection fraction (20-25%). Recent cardiac ablation on December 03, 2023, for PVCs, resulting in reduced PVCs. Current medications include metoprolol , spironolactone , Entresto , loop diuretic and Jardiance . Fluid status is well-managed post-ablation.  She recently went to the hospital with a near syncopal event and her medications have been held. - Follow-up with echocardiogram - Follow-up with pharmacist to schedule - Medications to be gradually reinitiated by cardiac team OSA (obstructive sleep apnea) Diagnosed with mild obstructive sleep apnea in early July 2025. Awaiting CPAP machine.  Losing 15% of body weight may improve condition Class 2 severe obesity with serious comorbidity and body mass index (BMI) of 35.0 to 35.9 in adult, unspecified obesity type S/P laparoscopic sleeve gastrectomy Weight: decrease of 20 lb (9%) over 3 months, 1 week  Start: 11/24/2023 222 lb (100.7 kg)  End: 03/06/2024 202 lb (91.6 kg)   Weight maintenance despite significant physical activity and dietary adherence. Possible water retention due to recent cardiac medication changes. . - Continue current exercise regimen and dietary tracking - Monitor body composition changes rather than focusing solely on scale weight - Encourage moderate intensity exercise to avoid training plateau if medically cleared by cardiology - Reassess in three weeks to evaluate progress and adjust plan as needed        Objective   Physical Exam:  Blood pressure 117/79, pulse 65, temperature 97.9 F (36.6 C), height 5' 5 (1.651 m), weight 202 lb (91.6 kg), SpO2 98%. Body  mass index is 33.61 kg/m.  General: She is overweight, cooperative, alert, well developed, and in no acute distress. PSYCH: Has normal mood, affect and thought process.   HEENT: EOMI, sclerae are anicteric. Lungs: Normal breathing effort, no conversational dyspnea. Extremities: No edema.  Neurologic: No gross sensory or motor deficits. No tremors or fasciculations noted.    Diagnostic Data Reviewed:  BMET    Component Value Date/Time   NA 139 03/01/2024 1159   NA 138 07/23/2023 1322   K 4.3 03/01/2024 1159   CL 100 03/01/2024 1159   CO2 29 03/01/2024 1159   GLUCOSE 105 (H) 03/01/2024 1159   BUN 23 (H) 03/01/2024 1159   BUN 22 07/23/2023 1322   CREATININE 0.97 03/01/2024 1159   CREATININE 0.90 03/26/2016 1536   CALCIUM 9.8 03/01/2024 1159   GFRNONAA >60 03/01/2024 1159   GFRNONAA >89 01/18/2014 1621   GFRAA >60 02/15/2020 1845   GFRAA >89 01/18/2014 1621   Lab Results  Component Value Date   HGBA1C 5.2 07/16/2023   HGBA1C 5.4 11/09/2015   Lab Results  Component Value Date   INSULIN  11.8 01/03/2024   Lab Results  Component Value Date   TSH 1.750 07/23/2023   CBC    Component Value Date/Time   WBC 9.7 02/26/2024 1233   RBC 5.19 (H) 02/26/2024 1233   HGB 15.0 02/26/2024 1233   HGB 10.6 (L) 09/07/2019 1159   HCT 46.9 (H) 02/26/2024 1233   HCT 32.6 (L) 08/06/2020 1517   PLT 289 02/26/2024 1233   PLT 278 09/07/2019 1159   MCV 90.4 02/26/2024 1233  MCV 82 09/07/2019 1159   MCH 28.9 02/26/2024 1233   MCHC 32.0 02/26/2024 1233   RDW 14.1 02/26/2024 1233   RDW 14.7 09/07/2019 1159   Iron  Studies    Component Value Date/Time   IRON  44 02/23/2024 1600   TIBC 372 02/23/2024 1600   FERRITIN 68 02/23/2024 1600   IRONPCTSAT 12 02/23/2024 1600   Lipid Panel     Component Value Date/Time   CHOL 164 06/11/2023 1107   TRIG 66.0 06/11/2023 1107   HDL 67.00 06/11/2023 1107   CHOLHDL 2 06/11/2023 1107   VLDL 13.2 06/11/2023 1107   LDLCALC 83 06/11/2023 1107    Hepatic Function Panel     Component Value Date/Time   PROT 6.7 01/21/2023 1523   PROT 7.3 05/15/2021 1032   ALBUMIN  3.8 01/21/2023 1523   ALBUMIN  4.5 05/15/2021 1032   AST 38 01/21/2023 1523   ALT 30 01/21/2023 1523   ALKPHOS 64 01/21/2023 1523   BILITOT 1.1 01/21/2023 1523   BILITOT 0.3 05/15/2021 1032   BILIDIR <0.1 08/26/2020 0201   IBILI 0.6 11/21/2017 2155      Component Value Date/Time   TSH 1.750 07/23/2023 1321   Nutritional Lab Results  Component Value Date   VD25OH 24.1 (L) 01/03/2024   VD25OH 14.88 (L) 06/11/2023   VD25OH 12.60 (L) 07/29/2020    Medications: Outpatient Encounter Medications as of 03/06/2024  Medication Sig Note   albuterol  (VENTOLIN  HFA) 108 (90 Base) MCG/ACT inhaler Inhale 1-2 puffs into the lungs every 6 (six) hours as needed for wheezing or shortness of breath.    diclofenac  (VOLTAREN ) 75 MG EC tablet Take 1 tablet by mouth twice daily as needed    ipratropium-albuterol  (DUONEB) 0.5-2.5 (3) MG/3ML SOLN USE 1 AMPULE IN NEBULIZER EVERY 6 HOURS AS NEEDED    ivabradine  (CORLANOR ) 7.5 MG TABS tablet Take 1 tablet (7.5 mg total) by mouth 2 (two) times daily with a meal.    losartan  (COZAAR ) 25 MG tablet Take 0.5 tablets (12.5 mg total) by mouth at bedtime.    nitroGLYCERIN  (NITROSTAT ) 0.4 MG SL tablet Place 1 tablet (0.4 mg total) under the tongue every 5 (five) minutes as needed for chest pain.    OVER THE COUNTER MEDICATION Take 1 capsule by mouth daily. BARIATRIC VITAMIN    tizanidine  (ZANAFLEX ) 2 MG capsule Take 2 mg by mouth as needed for muscle spasms.    torsemide  (DEMADEX ) 20 MG tablet Take 1 tablet (20 mg total) by mouth daily as needed (for weight gain and swelling).    Ubrogepant  (UBRELVY ) 100 MG TABS Take 1 tablet (100 mg total) by mouth daily as needed (migraine).    Vitamin D , Ergocalciferol , (DRISDOL ) 1.25 MG (50000 UNIT) CAPS capsule Take 1 capsule by mouth once a week 02/23/2024: Takes on Wednesday   [DISCONTINUED] gabapentin   (NEURONTIN ) 300 MG capsule Take 1 capsule (300 mg total) by mouth at bedtime as needed (back spasms).    [DISCONTINUED] ipratropium (ATROVENT ) 0.02 % nebulizer solution Inhale 500 mcg into the lungs. 02/23/2024: As needed   [DISCONTINUED] acetaminophen  (TYLENOL ) 650 MG CR tablet Take 650 mg by mouth every 8 (eight) hours as needed for pain (headache).    [DISCONTINUED] budesonide -formoterol  (SYMBICORT ) 160-4.5 MCG/ACT inhaler Inhale 2 puffs into the lungs 2 (two) times daily as needed.    [DISCONTINUED] dicyclomine  (BENTYL ) 20 MG tablet Take 1 tablet (20 mg total) by mouth daily as needed for spasms.    [DISCONTINUED] fluticasone  (FLONASE ) 50 MCG/ACT nasal spray Place 2 sprays  into both nostrils daily as needed for allergies.    No facility-administered encounter medications on file as of 03/06/2024.     Follow-Up   Return in about 3 weeks (around 03/27/2024) for For Weight Mangement with Dr. Francyne.SABRA She was informed of the importance of frequent follow up visits to maximize her success with intensive lifestyle modifications for her multiple health conditions.  Attestation Statement   Reviewed by clinician on day of visit: allergies, medications, problem list, medical history, surgical history, family history, social history, and previous encounter notes.     Lucas Francyne, MD

## 2024-03-06 NOTE — Assessment & Plan Note (Signed)
 Diagnosed with mild obstructive sleep apnea in early July 2025. Awaiting CPAP machine.  Losing 15% of body weight may improve condition

## 2024-03-07 NOTE — Progress Notes (Signed)
 Advanced Heart Failure Clinic Note  PCP: Rollene Almarie LABOR, MD Primary Cardiologist: [name], MD (Inactive)  HF Cardiologist: Dr. Cherrie   HPI:  [name] is a 48 y.o. 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    Admitted 7/19 with CP Symptoms thought to be related to frequent PVCs. EP consulted and scheduled her for PVC ablation. Echo repeated 11/22/17 and showed improved EF 50-55%. S/p  PVC ablation 12/01/17. Was felt not to be successful.  Was on flecainide  but discontinued due to reduced EF. Placed on amio   Echo 6/20 showed drop in EF back down to 25-30%. Was instructed to f/u in clinic but pt did not. She had outpatient monitor 04/2018 that showed rare PVCs.    Admitted 6/21 with chest pain and shortness of breath. ECHO EF < 20% and normal RV. Had cath with normal cors and preserved cardiac output. Plan was to f/u with EP regarding PVCs. She has remained on AAD therapy w/ amiodarone  + ? blocker therapy.   Admitted to [facility] 4/22 w/ increased dyspnea and volume overload. Also w/ increased PVC burden. Echo showed EF 20-25%, RV normal. She was diuresed w/ IV Lasix  and placed on amiodarone  gtt for PVC suppression. Seen by Dr. Waddell, due to the location of her PVCs, she is not a candidate for re-do ablation.    S/p Barostim 6/22.   Zio 8/22: 13% PVCs (unifocal)   Seen by EP 12/22 and Barostim titrated. Not felt to have any other options for PVCs.    Echo 7/23 EF 25-30% RV normal    Zio 14 day (12/23) showed frequent runs of NSVT, 13% PVC burden, concern for LMNA CM.  -> mexilitene increased to 300 bid   Cardiac PET 6/24 no ischemia/infarction. No active myocardial inflammation/ evidence of sarcoidosis. 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    Follow up with Dr. Waddell 6/25, Zio placed to re-quantify PVC burden. If > 20%, plan repeat PVC ablation. If < 10%, then medical Rx. If 10-20%, would discuss options.    Repeat Zio showed 25% PVC burden. => S/p PVC ablation 7/25 at [facility] with Dr. Sherrye.   CPX 7/25 showed moderate function limitation due to HF and obesity.   Post ablation 4 day Zio (9/25) showed mostly NSR, 7.4% PVC burden   Seen for follow-up last week. She was hypotensive and dizzy. Entresto  reduced to 24/26 mg BID. She was seen in the ED a few days later with hypotension and near syncope. Orthostatics were positive. All GDMT was held and given IVF. K was 2.9, aggressively supplemented. She was discharged home.   Seen on 03/01/24 for post ED follow-up for hypotension. She was feeling better off HF medications and had more energy. No recurrent presyncope. She was exercising most days and participating in cardiac rehab 3 days a week. No exertional dyspnea, orthopnea, PND or lower extremity edema. SBP had been mostly > 100 since ED visit, one reading in 90s.  She [facility]. Reported she had lost 20 lbs and made significant changes to her diet in recent months.  Today she returns to HF clinic for pharmacist  medication titration. At last visit with APP, Corlanor  7.5 mg BID was restarted and losartan  12.5 mg daily was added. Continued to hold Jardiance , spironolactone , metoprolol  XL, and Entresto . BMET checked at that visit was greatly improved and BNP was stable. Of note, BP was 117/79 and HR 65 at [facility] visit on 03/06/24. Today, patient reports she has been feeling great overall. No episodes of orthostasis since starting losartan  and restarting Corlanor  last week. Denies lightheadedness/dizziness, chest pain, and palpitations. Feels her breathing is good and denies SOB and orthopnea/PND. Able to complete all her normal  activities. She monitors her weight at home which has been stable ~202-205 lbs. No LEE. Reports taking torsemide  20 mg usually twice a week which she feels keeps her fluid controlled. Recalls BP readings at home this past week, 119/78 and 117/81. Endorses adherence to low sodium diet.  HF Medications: Losartan  12.5 mg daily Corlanor  7.5 mg twice daily Torsemide  20 mg daily as needed  Has the patient been experiencing any side effects to the medications prescribed?  No  Does the patient have any problems obtaining medications due to transportation or finances?   No  Understanding of regimen: good Understanding of indications: good Potential of compliance: good Patient understands to avoid NSAIDs. Patient understands to avoid decongestants.    Pertinent Lab Values: 03/08/24: Serum creatinine 0.95, BUN 18, Potassium 3.9, Sodium 136 03/01/24: BNP 48.2  Vital Signs: Weight: 210.4 lbs (last clinic weight: 204.8 lbs) Blood pressure: 128/90  Heart rate: 84   Assessment/Plan: 1. Chronic systolic HF in setting of TTN CM:   - due to TTN CM, onset 1999.  - S/P [employer] ICD 07/2016 - s/p Barostim 6/22 - LHC 6/17 No CAD - Echo 12/17 EF 20-25% - Echo 8/18 EF 25-30% s/p ICD 3/18.  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 Suspect progressive course. Has been referred to Dr. Joseph>>Variant is interpreted as variant of unknown clinical significance. Per Dr. Thera, as there are no affected family members to determine if this variant segregates with disease, genetic testing her daughter for this familial TTN variant is unwarranted  - Cardiac PET 6/24: no ischemia/infarction. No active myocardial inflammation/ evidence of sarcoidosis - Unclear if PVCs are a result of her CM or contributing to it (or both). Failed previous PVC  ablation. C/w high burden despite 2 AAD therapy regimen (amiodarone  and mexiletine), s/p ablation 7/25.  - NYHA I-II, euvolemic on exam. Recently volume depleted. Continue torsemide  20 mg just as needed for now (previously on 20 mg BID). - She was off all GDMT with recent episodes of presyncope, orthostasis and AKI. No episodes of orthostasis since starting losartan  and restarting Corlanor  last week and she has been feeling great. BP in clinic is elevated and home readings show SBP 117-118. BMET today is stable and improved. - Restart Jardiance  10 mg daily - Counseled patient to restart low dose metoprolol  succinate 12.5 mg daily if after 1 week on Jardiance  she is still tolerating medications well. - Continue losartan  12.5 mg daily - Continue Corlanor  7.5 mg twice daily - Continue to hold spironolactone  and Entresto  - Now that PVCs are better suppressed will obtain echo to reassess LV function - Suspect she may need advanced therapies in future but currently too early.  - CPX 7/25 with  moderate limitation due to HF and obesity - Continue cardiac rehab   2. Frequent PVCs/Bigeminy - Holter Monitor 6/19 with 30% PVCs with one primary morphology.  - S/p PVC ablation 11/2017 with Dr Waddell unsuccessful - Off flecainide  with EF down and persistent PVCs.  - Zio Patch 04/2019 > 17% PVCs and >8% couplets.  - Zio Patch 11/28/2019 8.5 % PVCs - Zio 9/22 13% PVCs (unifocal)  - Zio 12/23 showed frequent NSVT, 13% PVC burden (see discussion above) - Cardiac PET 6/24 negative for sarcoid  - Saw Dr. Waddell (EP). Zio showed 25% PVCR burden - s/p PVC ablation 7/25 - Zio 9/25 showed PVC burden down to 7.4% - Now off amio and mexiletine    3. Obesity - s/p gastric Sleeve at [facility] 09/20/17  - Body mass index is 34.15 kg/m. - Working with [facility] - Congratulated on weight loss efforts so far   4. Daytime Fatigue - Sleep study in 2019 and again in 2021 were both negative for sleep  apnea  - Recently improved   5. DM 2 - Restarting Jardiance  at the lower dose today - Per PCP   6. AKI - Scr baseline 0.8, recently up to 1.7 - suspect d/t volume depletion in setting of diuretics/HF GDMT - Received IVF in ED. Slowly adding back HF GDMT. BMET today is improved and stable.  Follow up 3 weeks in APP Clinic  [name], PharmD Clinical Pharmacist

## 2024-03-08 ENCOUNTER — Encounter (HOSPITAL_COMMUNITY)

## 2024-03-08 ENCOUNTER — Ambulatory Visit (HOSPITAL_COMMUNITY): Payer: Self-pay | Admitting: Pharmacist

## 2024-03-08 ENCOUNTER — Encounter (HOSPITAL_COMMUNITY)
Admission: RE | Admit: 2024-03-08 | Discharge: 2024-03-08 | Disposition: A | Discharge status: Home / Self Care | Source: Ambulatory Visit | Attending: Internal Medicine | Admitting: Internal Medicine

## 2024-03-08 ENCOUNTER — Other Ambulatory Visit (HOSPITAL_COMMUNITY): Payer: Self-pay | Admitting: Internal Medicine

## 2024-03-08 ENCOUNTER — Ambulatory Visit (HOSPITAL_BASED_OUTPATIENT_CLINIC_OR_DEPARTMENT_OTHER)
Admission: RE | Admit: 2024-03-08 | Discharge: 2024-03-08 | Disposition: A | Discharge status: Home / Self Care | Source: Ambulatory Visit | Attending: Cardiology | Admitting: Cardiology

## 2024-03-08 ENCOUNTER — Other Ambulatory Visit (HOSPITAL_COMMUNITY)

## 2024-03-08 VITALS — BP 128/90 | HR 84 | Wt 210.4 lb

## 2024-03-08 DIAGNOSIS — I493 Ventricular premature depolarization: Secondary | ICD-10-CM | POA: Insufficient documentation

## 2024-03-08 DIAGNOSIS — I509 Heart failure, unspecified: Secondary | ICD-10-CM

## 2024-03-08 DIAGNOSIS — Z6834 Body mass index (BMI) 34.0-34.9, adult: Secondary | ICD-10-CM | POA: Insufficient documentation

## 2024-03-08 DIAGNOSIS — Z7984 Long term (current) use of oral hypoglycemic drugs: Secondary | ICD-10-CM | POA: Insufficient documentation

## 2024-03-08 DIAGNOSIS — Z86718 Personal history of other venous thrombosis and embolism: Secondary | ICD-10-CM | POA: Insufficient documentation

## 2024-03-08 DIAGNOSIS — E119 Type 2 diabetes mellitus without complications: Secondary | ICD-10-CM | POA: Insufficient documentation

## 2024-03-08 DIAGNOSIS — Z9884 Bariatric surgery status: Secondary | ICD-10-CM | POA: Insufficient documentation

## 2024-03-08 DIAGNOSIS — I5022 Chronic systolic (congestive) heart failure: Secondary | ICD-10-CM | POA: Insufficient documentation

## 2024-03-08 DIAGNOSIS — I110 Hypertensive heart disease with heart failure: Secondary | ICD-10-CM | POA: Insufficient documentation

## 2024-03-08 DIAGNOSIS — Z95810 Presence of automatic (implantable) cardiac defibrillator: Secondary | ICD-10-CM | POA: Insufficient documentation

## 2024-03-08 DIAGNOSIS — G4733 Obstructive sleep apnea (adult) (pediatric): Secondary | ICD-10-CM | POA: Insufficient documentation

## 2024-03-08 DIAGNOSIS — I5042 Chronic combined systolic (congestive) and diastolic (congestive) heart failure: Secondary | ICD-10-CM | POA: Diagnosis not present

## 2024-03-08 DIAGNOSIS — E669 Obesity, unspecified: Secondary | ICD-10-CM | POA: Insufficient documentation

## 2024-03-08 DIAGNOSIS — Z79899 Other long term (current) drug therapy: Secondary | ICD-10-CM | POA: Insufficient documentation

## 2024-03-08 DIAGNOSIS — N179 Acute kidney failure, unspecified: Secondary | ICD-10-CM | POA: Insufficient documentation

## 2024-03-08 LAB — BASIC METABOLIC PANEL WITH GFR
Anion gap: 10 (ref 5–15)
BUN: 18 mg/dL (ref 6–20)
CO2: 24 mmol/L (ref 22–32)
Calcium: 9.9 mg/dL (ref 8.9–10.3)
Chloride: 102 mmol/L (ref 98–111)
Creatinine, Ser: 0.95 mg/dL (ref 0.44–1.00)
GFR, Estimated: >60 mL/min (ref >60)
Glucose, Bld: 77 mg/dL (ref 70–99)
Potassium: 3.9 mmol/L (ref 3.5–5.1)
Sodium: 136 mmol/L (ref 135–145)

## 2024-03-08 MED ORDER — EMPAGLIFLOZIN 10 MG PO TABS: 10.0000 mg | ORAL_TABLET | Freq: Every day | ORAL | 5 refills | Status: DC

## 2024-03-08 MED ORDER — METOPROLOL SUCCINATE ER 25 MG PO TB24: 12.5000 mg | ORAL_TABLET | Freq: Every day | ORAL | 5 refills | Status: AC

## 2024-03-08 NOTE — Patient Instructions (Signed)
 It was a pleasure seeing you today!  MEDICATIONS: -We are changing your medications today -Start taking Jardiance  10 mg (1 tablet) daily. After 1 week if you are still feeling good and do not have have any lightheadedness/dizziness or extra fluid, start taking metoprolol  succinate 12.5 mg (1/2 tablet) daily. -Call if you have questions about your medications.  LABS: -We will call you if your labs need attention.  NEXT APPOINTMENT: Return to clinic in 3 week for echo and APP appointment.  In general, to take care of your heart failure: -Limit your fluid intake to 2 Liters (half-gallon) per day.   -Limit your salt intake to ideally 2-3 grams (2000-3000 mg) per day. -Weigh yourself daily and record, and bring that weight diary to your next appointment.  (Weight gain of 2-3 pounds in 1 day typically means fluid weight.) -The medications for your heart are to help your heart and help you live longer.   -Please contact us  before stopping any of your heart medications.  Call the clinic at [phone] with questions or to reschedule future appointments.

## 2024-03-09 ENCOUNTER — Other Ambulatory Visit (HOSPITAL_COMMUNITY): Payer: Self-pay

## 2024-03-09 DIAGNOSIS — I5042 Chronic combined systolic (congestive) and diastolic (congestive) heart failure: Secondary | ICD-10-CM

## 2024-03-09 MED ORDER — EMPAGLIFLOZIN 10 MG PO TABS: 10.0000 mg | ORAL_TABLET | Freq: Every day | ORAL | 5 refills | Status: AC

## 2024-03-10 ENCOUNTER — Encounter (HOSPITAL_COMMUNITY)

## 2024-03-13 ENCOUNTER — Encounter (HOSPITAL_COMMUNITY): Admission: RE | Admit: 2024-03-13 | Source: Ambulatory Visit

## 2024-03-13 ENCOUNTER — Ambulatory Visit: Attending: Cardiology

## 2024-03-13 DIAGNOSIS — I5042 Chronic combined systolic (congestive) and diastolic (congestive) heart failure: Secondary | ICD-10-CM

## 2024-03-13 DIAGNOSIS — Z95810 Presence of automatic (implantable) cardiac defibrillator: Secondary | ICD-10-CM | POA: Diagnosis not present

## 2024-03-14 ENCOUNTER — Telehealth (HOSPITAL_COMMUNITY): Payer: Self-pay | Admitting: *Deleted

## 2024-03-14 NOTE — Progress Notes (Signed)
 EPIC Encounter for ICM Monitoring  Patient Name: [name] is a 48 y.o. female Date: 03/14/2024 Primary Care Physican: Rollene Almarie LABOR, MD Primary Cardiologist: Bensimhon Electrophysiologist: Inocencio 12/28/2022 Weight: 218 lbs       02/03/2023 Weight: 200 lbs 07/28/2023 Weight: 209 lbs 08/05/2023 Office Weight: 215 lbs 09/27/2023 Weight: 217 lbs 01/25/2024 Office Weight: 210 lbs 03/06/2024 Office Weight: 202 lbs   Transmission results reviewed and sent via mychart.    Since 01/31/2024 ICM Remote Transmission: [facility] HF Index is 1 suggesting normal fluid levels within the last month.     Prescribed: Torsemide  20 mg Take 14 tablet (20 mg total) by mouth as needed (for weight gain and swelling)   Labs: 03/08/2024 Creatinine 0.95, BUN 18, Potassium 3.9, Sodium 136, GFR >60  03/01/2024 Creatinine 0.97, BUN 23, Potassium 4.3, Sodium 139, GFR >60  02/26/2024 Creatinine 1.72, BUN 42, Potassium 2.9, Sodium 134, GFR 36  02/23/2024 Creatinine 1.16, BUN 15, Potassium 4.1, Sodium 135, GFR 58 01/31/2024 Creatinine 0.85, BUN 15, Potassium 137, Sodium 137, GFR >60 11/29/2023 Creatinine 0.70, BUN 8,   Potassium 3.9, Sodium 145, GFR >90 11/15/2023 Creatinine 0.83, BUN 8,   Potassium 4.3, Sodium 141, GFR >60 A complete set of results can be found in Results Review.   Recommendations:  No changes.    Follow-up plan: ICM clinic phone appointment 04/13/2024.  91 day device clinic remote transmission 03/22/2024.              EP/Cardiology next office visit:  03/16/2024 with Dr Waddell (to discuss ICD lead).  03/28/2024 with HF Clinic.  Recall 07/23/2024 with Jodie Passey, PA.          Copy of ICM check sent to Dr. Inocencio.   Remote Monitoring Medically Necessary for Heart Failure Management.  3 Month [facility]Logic[employer] Heart Failure Index:    8 Day Data Trend:          Megan GORMAN Garner, RN 03/14/2024 10:20 AM

## 2024-03-14 NOTE — Telephone Encounter (Signed)
 Pt LVM that she missed class yesterday, 03/13/24, due to still being sick.

## 2024-03-15 ENCOUNTER — Telehealth: Payer: Self-pay | Admitting: Internal Medicine

## 2024-03-15 ENCOUNTER — Telehealth (HOSPITAL_COMMUNITY): Payer: Self-pay

## 2024-03-15 ENCOUNTER — Encounter: Payer: Self-pay | Admitting: Family Medicine

## 2024-03-15 ENCOUNTER — Encounter (HOSPITAL_COMMUNITY): Admission: RE | Admit: 2024-03-15

## 2024-03-15 ENCOUNTER — Telehealth (INDEPENDENT_AMBULATORY_CARE_PROVIDER_SITE_OTHER): Admitting: Family Medicine

## 2024-03-15 ENCOUNTER — Ambulatory Visit: Payer: Self-pay

## 2024-03-15 VITALS — Wt 199.0 lb

## 2024-03-15 DIAGNOSIS — J069 Acute upper respiratory infection, unspecified: Secondary | ICD-10-CM | POA: Diagnosis not present

## 2024-03-15 MED ORDER — PROMETHAZINE-CODEINE 6.25-10 MG/5ML PO SYRP: 5.0000 mL | ORAL_SOLUTION | Freq: Four times a day (QID) | ORAL | 0 refills | Status: DC | PRN

## 2024-03-15 NOTE — Telephone Encounter (Signed)
 Scheduled Virtual per pt request.   FYI Only or Action Required?: FYI only for provider.  Patient was last seen in primary care on 03/06/2024 by Francyne Romano, MD.  Called Nurse Triage reporting URI.  Symptoms began several days ago.  Interventions attempted: OTC medications: theraflu and alka seltzer.  Symptoms are: gradually worsening.  Triage Disposition: See Today or Tomorrow in Office (overriding Home Care)  Patient/caregiver understands and will follow disposition?: Yes Reason for Disposition  Cough with cold symptoms (e.g., runny nose, postnasal drip, throat clearing)  Answer Assessment - Initial Assessment Questions Pt reports cough, sore throat, nasal and chest congestion x 5 days. Has not taken a covid test. Taking alka seltzer and theraflu.   1. ONSET: When did the cough begin?      5 days ago  2. SEVERITY: How bad is the cough today?        3. SPUTUM: Describe the color of your sputum (e.g., none, dry cough; clear, white, yellow, green)     Denies  4. HEMOPTYSIS: Are you coughing up any blood? If Yes, ask: How much? (e.g., flecks, streaks, tablespoons, etc.)     Denies  5. DIFFICULTY BREATHING: Are you having difficulty breathing? If Yes, ask: How bad is it? (e.g., mild, moderate, severe)      Denies  6. FEVER: Do you have a fever? If Yes, ask: What is your temperature, how was it measured, and when did it start?     Denies  Protocols used: Cough - Acute Non-Productive-A-AH

## 2024-03-15 NOTE — Progress Notes (Signed)
 Patient ID: [name], female   DOB: 12/05/1975, 48 y.o.   MRN: [id number]   Virtual Visit via Video Note  I connected with [name] on 03/15/24 at  2:45 PM EDT by a video enabled telemedicine application and verified that I am speaking with the correct person using two identifiers.  Location patient: home Location provider:work or home office Persons participating in the virtual visit: patient, provider  I discussed the limitations of evaluation and management by telemedicine and the availability of in person appointments. The patient expressed understanding and agreed to proceed.   HPI: [name] called with onset this past Sunday of nasal congestion, sore throat, cough, malaise, body aches.  She has had no fever.  Did not do any COVID testing.  Cough has been fairly severe at night and not relieved with over-the-counter medications.  No nausea, vomiting, or diarrhea.  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  Followed closely by cardiology.  Heart failure symptoms stable.  Non-smoker.   ROS: See pertinent positives and negatives per HPI.  Past Medical History:  Diagnosis Date   Acute on chronic systolic (congestive) heart failure (HCC) 04/29/2017   Acute pain of right shoulder 06/16/2017   ADHD    AICD (automatic cardioverter/defibrillator) present    Anginal pain    Anxiety    Arthritis    right shoulder    Asthma    Back pain    CHF (congestive heart failure) (HCC)    Chronic combined systolic and diastolic heart failure (HCC) 03/05/2014   Closed low lateral malleolus fracture 10/23/2013   Constipation    Cystitis 10/21/2017   Depression    Depression with anxiety 01/20/2013   Diabetes mellitus without complication (HCC)    Diverticulosis    Dyspnea    comes and goes intermittently mostly with exertion    Dysrhythmia    Edema, lower extremity    Essential hypertension    Prev followed by [name]     Fibroid    age 9   Gallstones    Generalized abdominal cramping    History of cardiomyopathy    Hypertension    IBS (irritable bowel syndrome)    ICD (implantable cardioverter-defibrillator), single, 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 CPAP intolerance >> smothering Good tolerance of auto device (optimal pressure 12-13 on download).  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   PVC (premature ventricular contraction) 06/23/2016   Seizures (HCC)    as a child (08/03/2016)   SOB (shortness of breath)    Termination of pregnancy    due to cardiac risk   Vitamin B12 deficiency    Vitamin D  deficiency     Past Surgical History:  Procedure Laterality Date  BREAST BIOPSY Right 09/30/2022   US  RT BREAST BX W LOC DEV 1ST LESION IMG BX SPEC US  GUIDE 09/30/2022 [facility]   CARDIAC CATHETERIZATION N/A 11/11/2015   Procedure: Right/Left Heart Cath and Coronary Angiography;  Surgeon: [name], MD;  Location: [facility]VASIVE [facility];  Service: Cardiovascular;  Laterality: N/A;   CARDIAC CATHETERIZATION  ~ 2015   CARDIAC DEFIBRILLATOR PLACEMENT  08/03/2016   CESAREAN SECTION  1999   COLONOSCOPY WITH PROPOFOL  N/A 04/21/2016   Procedure: COLONOSCOPY WITH PROPOFOL ;  Surgeon: [name], MD;  Location: [facility];  Service:  Gastroenterology;  Laterality: N/A;   ESOPHAGOGASTRODUODENOSCOPY (EGD) WITH PROPOFOL  N/A 04/21/2016   Procedure: ESOPHAGOGASTRODUODENOSCOPY (EGD) WITH PROPOFOL ;  Surgeon: [name], MD;  Location: [facility];  Service: Gastroenterology;  Laterality: N/A;   FOOT FRACTURE SURGERY Right ~ 2003   FRACTURE SURGERY     ICD IMPLANT N/A 08/03/2016   Procedure: ICD Implant;  Surgeon: [name], MD;  Location: [facility] INVASIVE CV LAB;  Service: Cardiovascular;  Laterality: N/A;   IR ANGIOGRAM PELVIS SELECTIVE OR SUPRASELECTIVE  10/28/2022   IR ANGIOGRAM SELECTIVE EACH ADDITIONAL VESSEL  10/28/2022   IR ANGIOGRAM SELECTIVE EACH ADDITIONAL VESSEL  10/28/2022   IR ANGIOGRAM SELECTIVE EACH ADDITIONAL VESSEL  10/28/2022   IR EMBO TUMOR ORGAN ISCHEMIA INFARCT INC GUIDE ROADMAPPING  10/28/2022   IR US  GUIDE VASC ACCESS LEFT  10/28/2022   IR US  GUIDE VASC ACCESS RIGHT  10/28/2022   LAPAROSCOPIC CHOLECYSTECTOMY  12/2006   LAPAROSCOPIC GASTRIC SLEEVE RESECTION     LAPAROSCOPY ABDOMEN DIAGNOSTIC  2008   cut bile duct w/gallbladder OR; had to go in later & fix leak; hospitalized for 2 months   LEFT HEART CATHETERIZATION WITH CORONARY ANGIOGRAM N/A 02/26/2014   Procedure: LEFT HEART CATHETERIZATION WITH CORONARY ANGIOGRAM;  Surgeon: Candyce GORMAN Reek, MD;  Location[facility] CATH LAB;  Service: Cardiovascular;  Laterality: N/A;   OVARIAN CYST REMOVAL Right 1999   PVC ABLATION N/A 12/01/2017   Procedure: PVC ABLATION;  Surgeon: Waddell Danelle ORN, MD;  Location: [facility] CV LAB;  Service: Cardiovascular;  Laterality: N/A;   RIGHT HEART CATH N/A 08/05/2017   Procedure: RIGHT HEART CATH;  Surgeon: [name], MD;  Location: [facility];  Service: Cardiovascular;  Laterality: N/A;   RIGHT HEART CATH N/A 11/16/2019   Procedure: RIGHT HEART CATH;  Surgeon: [name], MD;  Location: [facility];  Service: Cardiovascular;  Laterality: N/A;   TUBAL LIGATION  1999    Family History  Problem Relation  Age of Onset   Rheum arthritis Mother    Obesity Mother    Emphysema Maternal Grandmother        smoked   Heart disease Maternal Grandmother 90       MI   Allergies Daughter    Colon cancer Neg Hx     SOCIAL HX: Non-smoker   Current Outpatient Medications:    albuterol  (VENTOLIN  HFA) 108 (90 Base) MCG/ACT inhaler, Inhale 1-2 puffs into the lungs every 6 (six) hours as needed for wheezing or shortness of breath., Disp: 18 g, Rfl: 0   diclofenac  (VOLTAREN ) 75 MG EC tablet, Take 1 tablet by mouth twice daily as needed, Disp: 60 tablet, Rfl: 0   empagliflozin  (JARDIANCE ) 10 MG TABS tablet, Take 1 tablet (10 mg total) by mouth daily before breakfast., Disp: 30 tablet, Rfl: 5   ipratropium-albuterol  (DUONEB) 0.5-2.5 (3) MG/3ML SOLN, USE 1 AMPULE IN NEBULIZER EVERY  6 HOURS AS NEEDED, Disp: 360 mL, Rfl: 0   ivabradine  (CORLANOR ) 7.5 MG TABS tablet, Take 1 tablet (7.5 mg total) by mouth 2 (two) times daily with a meal., Disp: , Rfl:    losartan  (COZAAR ) 25 MG tablet, Take 0.5 tablets (12.5 mg total) by mouth at bedtime., Disp: 15 tablet, Rfl: 3   metoprolol  succinate (TOPROL  XL) 25 MG 24 hr tablet, Take 0.5 tablets (12.5 mg total) by mouth daily., Disp: 15 tablet, Rfl: 5   nitroGLYCERIN  (NITROSTAT ) 0.4 MG SL tablet, Place 1 tablet (0.4 mg total) under the tongue every 5 (five) minutes as needed for chest pain., Disp: 75 tablet, Rfl: 0   OVER THE COUNTER MEDICATION, Take 1 capsule by mouth daily. BARIATRIC VITAMIN, Disp: , Rfl:    promethazine -codeine  (PHENERGAN  WITH CODEINE ) 6.25-10 MG/5ML syrup, Take 5 mLs by mouth every 6 (six) hours as needed for cough., Disp: 120 mL, Rfl: 0   tizanidine  (ZANAFLEX ) 2 MG capsule, Take 2 mg by mouth as needed for muscle spasms., Disp: , Rfl:    torsemide  (DEMADEX ) 20 MG tablet, Take 1 tablet (20 mg total) by mouth daily as needed (for weight gain and swelling)., Disp: , Rfl:    Ubrogepant  (UBRELVY ) 100 MG TABS, Take 1 tablet (100 mg total) by mouth daily as needed  (migraine)., Disp: 30 tablet, Rfl: 3   Vitamin D , Ergocalciferol , (DRISDOL ) 1.25 MG (50000 UNIT) CAPS capsule, Take 1 capsule by mouth once a week, Disp: 12 capsule, Rfl: 0  EXAM:  VITALS per patient if applicable:  GENERAL: alert, oriented, appears well and in no acute distress  HEENT: atraumatic, conjunttiva clear, no obvious abnormalities on inspection of external nose and ears  NECK: normal movements of the head and neck  LUNGS: on inspection no signs of respiratory distress, breathing rate appears normal, no obvious gross SOB, gasping or wheezing  CV: no obvious cyanosis  MS: moves all visible extremities without noticeable abnormality  PSYCH/NEURO: pleasant and cooperative, no obvious depression or anxiety, speech and thought processing grossly intact  ASSESSMENT AND PLAN:  Discussed the following assessment and plan:  URI with cough.  Suspect probably viral.  She has had no fever.  No respiratory distress.  Does have risk factor of postpartum cardiomyopathy.  She has had severe cough at night not relieved with over-the-counter medications.  We agreed to send in Phenergan  with codeine  cough syrup which she has taken in the past without difficulty 1 teaspoon every 6 hours as needed for severe cough.  She is aware this may be sedating.  Follow-up immediately for any fever or increased shortness of breath or other concerns     I discussed the assessment and treatment plan with the patient. The patient was provided an opportunity to ask questions and all were answered. The patient agreed with the plan and demonstrated an understanding of the instructions.   The patient was advised to call back or seek an in-person evaluation if the symptoms worsen or if the condition fails to improve as anticipated.     Wolm Scarlet, MD

## 2024-03-15 NOTE — Telephone Encounter (Signed)
 Patient c/o sick for 12:30 CR class. Informed patient she must be symptom-free for 48 hours before returning to class.

## 2024-03-15 NOTE — Telephone Encounter (Signed)
 Copied from CRM [id number]. Topic: General - Call Back - No Documentation >> Mar 15, 2024  2:27 PM Rea C wrote: Reason for CRM: Patient returned call for [facility]. No other message was left.

## 2024-03-15 NOTE — Telephone Encounter (Signed)
 I spoke with the patient and checked her in for virtual visit today

## 2024-03-16 ENCOUNTER — Encounter: Payer: Self-pay | Admitting: Family Medicine

## 2024-03-16 ENCOUNTER — Ambulatory Visit: Attending: Internal Medicine | Admitting: Internal Medicine

## 2024-03-16 ENCOUNTER — Encounter (HOSPITAL_COMMUNITY)

## 2024-03-16 VITALS — BP 110/78 | HR 82 | Ht 65.0 in | Wt 207.0 lb

## 2024-03-16 DIAGNOSIS — I5022 Chronic systolic (congestive) heart failure: Secondary | ICD-10-CM

## 2024-03-16 LAB — CUP PACEART INCLINIC DEVICE CHECK
Date Time Interrogation Session: 20251023113622
HighPow Impedance: 103 Ohm
Implantable Lead Connection Status: 753985
Implantable Lead Implant Date: 20180312
Implantable Lead Location: 753860
Implantable Lead Model: 293
Implantable Lead Serial Number: 422842
Implantable Pulse Generator Implant Date: 20180312
Lead Channel Impedance Value: 810 Ohm
Lead Channel Pacing Threshold Amplitude: 1.7 V
Lead Channel Pacing Threshold Pulse Width: 1 ms
Lead Channel Sensing Intrinsic Amplitude: 11.7 mV
Lead Channel Setting Pacing Amplitude: 3.5 V
Lead Channel Setting Pacing Pulse Width: 1 ms
Lead Channel Setting Sensing Sensitivity: 0.5 mV
Pulse Gen Serial Number: 226601
Zone Setting Status: 755011

## 2024-03-16 MED ORDER — HYDROCODONE BIT-HOMATROP MBR 5-1.5 MG/5ML PO SOLN: 5.0000 mL | Freq: Four times a day (QID) | ORAL | 0 refills | Status: DC | PRN

## 2024-03-16 NOTE — Patient Instructions (Signed)
 Medication Instructions:  Your physician recommends that you continue on your current medications as directed. Please refer to the Current Medication list given to you today.  *If you need a refill on your cardiac medications before your next appointment, please call your pharmacy*  Lab Work: None ordered.  You may go to any [facility] Location for your lab work:  [facility] - [id number] ([facility]) - [address] [phone] [address]  [place] - [address]   [place]  - [address]   [place] - [address] - [address] [place] [place]  - [address] ([facility]   If you have labs (blood work) drawn today and your tests are completely normal, you will receive your results only by: [employer] (if you have MyChart)  If you have any lab test that is abnormal or we need to change your treatment, we will call you or send a MyChart message to review the results.  Testing/Procedures: None ordered.  Follow-Up: At [facility], you and your health needs are our priority.  As part of our continuing mission to provide you with exceptional heart care, we have created designated Provider Care Teams.  These Care Teams include your primary Cardiologist (physician) and Advanced Practice Providers (APPs -  Physician Assistants and Nurse Practitioners) who all work together to provide you with the care you need, when you need it.  We recommend signing up for the patient portal called MyChart.  Sign up information is provided on this After Visit Summary.  MyChart is used to connect with patients for Virtual Visits (Telemedicine).  Patients are able to view lab/test results, encounter notes, upcoming appointments, etc.  Non-urgent messages can be sent to your provider as well.   To learn more about what you can do with MyChart, go to  [email or web address].    Your next appointment:   1 year(s)  The format for your next appointment:   In Person  Provider:   Donnice Primus, MD or one of the following Advanced Practice Providers on your designated Care Team:   Charlies Arthur, [place] Ozell Jodie Passey, [place] Leotis Barrack, NP  Note: Remote monitoring is used to monitor your Pacemaker/ ICD from home. This monitoring reduces the number of office visits required to check your device to one time per year. It allows us  to keep an eye on the functioning of your device to ensure it is working properly.

## 2024-03-16 NOTE — Telephone Encounter (Signed)
 I sent in Hycodan cough syrup  Wolm LELON Scarlet MD [facility]

## 2024-03-16 NOTE — Progress Notes (Signed)
 HPI [name] returns for evaluation of PVC's. She is a pleasant 48 yo woman with a h/o peripartum CM. She also has had variable amounts of PVC's over the years. She had 5% PVC burden in 2016 in the setting of an EF of 25% and underwent ICD insertion. She never has had an ICD therapy. She had worsening symptoms and a repeat monitor in 2018 showed a 30% PVC burden and she underwent PVC ablation by me where we mapped her PVC's to the septum between the RV and LVOT. We had a 90% pace map match and RF applied to the RVOT septum would suppress the PVC's but not eliminate them. Her PVC morphology is inferior axis with transition from neg to positive at V3 with the PVC in V3 much more positive than the sinus beat in V3, suggestive of an LVOT location. She had a repeat Zio about 18 months ago showing a 13% PVC burden. She has class 2B symptoms and is on maximal medical therapy. A repeat monitor a couple of months ago demonstrated 30% PVC's and she was referred to Dr. Genna for repeat ablation. With RF delivered between the RCC/LCC commisure and the distal coronary sinus and the PVC's appeared to be successfully ablated. She has improved. She still has symptomatic PVC's but her symptoms are clearly better. Her most recent post ablation Zio showed a PVC burden of 7%. She had developed hypotension due to her meds and these were reduced.  Allergies  Allergen Reactions   Vancomycin  Other (See Comments)    did something to my kidneys, PROGRESSED TO KIDNEY FAILURE!!   Aspirin  Other (See Comments)    Wheezing, (Pt states that she just wheezes some when she takes aspirin  by itself but she can take aspirin  in a combination product).   Contrast Media [Iodinated Contrast Media] Other (See Comments)    Multiple CT contrast studies done over 2 weeks caused ARF   Ciprofloxacin Itching and Rash   Cyclobenzaprine  Other (See Comments)    Can not tolerate   Farxiga  [Dapagliflozin] Rash   Sulfa Antibiotics Itching  and Rash     Current Outpatient Medications  Medication Sig Dispense Refill   albuterol  (VENTOLIN  HFA) 108 (90 Base) MCG/ACT inhaler Inhale 1-2 puffs into the lungs every 6 (six) hours as needed for wheezing or shortness of breath. 18 g 0   diclofenac  (VOLTAREN ) 75 MG EC tablet Take 1 tablet by mouth twice daily as needed 60 tablet 0   empagliflozin  (JARDIANCE ) 10 MG TABS tablet Take 1 tablet (10 mg total) by mouth daily before breakfast. 30 tablet 5   ipratropium-albuterol  (DUONEB) 0.5-2.5 (3) MG/3ML SOLN USE 1 AMPULE IN NEBULIZER EVERY 6 HOURS AS NEEDED 360 mL 0   ivabradine  (CORLANOR ) 7.5 MG TABS tablet Take 1 tablet (7.5 mg total) by mouth 2 (two) times daily with a meal.     losartan  (COZAAR ) 25 MG tablet Take 0.5 tablets (12.5 mg total) by mouth at bedtime. 15 tablet 3   metoprolol  succinate (TOPROL  XL) 25 MG 24 hr tablet Take 0.5 tablets (12.5 mg total) by mouth daily. 15 tablet 5   nitroGLYCERIN  (NITROSTAT ) 0.4 MG SL tablet Place 1 tablet (0.4 mg total) under the tongue every 5 (five) minutes as needed for chest pain. 75 tablet 0   OVER THE COUNTER MEDICATION Take 1 capsule by mouth daily. BARIATRIC VITAMIN     promethazine -codeine  (PHENERGAN  WITH CODEINE ) 6.25-10 MG/5ML syrup Take 5 mLs by mouth every 6 (six)  hours as needed for cough. 120 mL 0   tizanidine  (ZANAFLEX ) 2 MG capsule Take 2 mg by mouth as needed for muscle spasms.     torsemide  (DEMADEX ) 20 MG tablet Take 1 tablet (20 mg total) by mouth daily as needed (for weight gain and swelling).     Ubrogepant  (UBRELVY ) 100 MG TABS Take 1 tablet (100 mg total) by mouth daily as needed (migraine). 30 tablet 3   Vitamin D , Ergocalciferol , (DRISDOL ) 1.25 MG (50000 UNIT) CAPS capsule Take 1 capsule by mouth once a week 12 capsule 0   No current facility-administered medications for this visit.     Past Medical History:  Diagnosis Date   Acute on chronic systolic (congestive) heart failure (HCC) 04/29/2017   Acute pain of right  shoulder 06/16/2017   ADHD    AICD (automatic cardioverter/defibrillator) present    Anginal pain    Anxiety    Arthritis    right shoulder    Asthma    Back pain    CHF (congestive heart failure) (HCC)    Chronic combined systolic and diastolic heart failure (HCC) 03/05/2014   Closed low lateral malleolus fracture 10/23/2013   Constipation    Cystitis 10/21/2017   Depression    Depression with anxiety 01/20/2013   Diabetes mellitus without complication (HCC)    Diverticulosis    Dyspnea    comes and goes intermittently mostly with exertion    Dysrhythmia    Edema, lower extremity    Essential hypertension    Prev followed by [name]    Fibroid    age 55   Gallstones    Generalized abdominal cramping    History of cardiomyopathy    Hypertension    IBS (irritable bowel syndrome)    ICD (implantable cardioverter-defibrillator), single, 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 CPAP intolerance >> smothering Good tolerance of auto device (optimal pressure 12-13 on download).  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   PVC (premature ventricular contraction) 06/23/2016   Seizures (HCC)    as a child (08/03/2016)   SOB (shortness of breath)    Termination of pregnancy    due to cardiac risk   Vitamin  B12 deficiency    Vitamin D  deficiency     ROS:   All systems reviewed and negative except as noted in the HPI.   Past Surgical History:  Procedure Laterality Date   BREAST BIOPSY Right 09/30/2022   US  RT BREAST BX W LOC DEV 1ST LESION IMG BX SPEC US  GUIDE 09/30/2022 [facility]   CARDIAC CATHETERIZATION N/A 11/11/2015   Procedure: Right/Left Heart Cath and Coronary Angiography;  Surgeon: [name], MD;  Location: [facility];  Service: Cardiovascular;  Laterality: N/A;  CARDIAC CATHETERIZATION  ~ 2015   CARDIAC DEFIBRILLATOR PLACEMENT  08/03/2016   CESAREAN SECTION  1999   COLONOSCOPY WITH PROPOFOL  N/A 04/21/2016   Procedure: COLONOSCOPY WITH PROPOFOL ;  Surgeon: [name], MD;  Location: [facility];  Service: Gastroenterology;  Laterality: N/A;   ESOPHAGOGASTRODUODENOSCOPY (EGD) WITH PROPOFOL  N/A 04/21/2016   Procedure: ESOPHAGOGASTRODUODENOSCOPY (EGD) WITH PROPOFOL ;  Surgeon: [name], MD;  Location: [facility];  Service: Gastroenterology;  Laterality: N/A;   FOOT FRACTURE SURGERY Right ~ 2003   FRACTURE SURGERY     ICD IMPLANT N/A 08/03/2016   Procedure: ICD Implant;  Surgeon: [name], MD;  Location: [facility]VASIVE CV LAB;  Service: Cardiovascular;  Laterality: N/A;   IR ANGIOGRAM PELVIS SELECTIVE OR SUPRASELECTIVE  10/28/2022   IR ANGIOGRAM SELECTIVE EACH ADDITIONAL VESSEL  10/28/2022   IR ANGIOGRAM SELECTIVE EACH ADDITIONAL VESSEL  10/28/2022   IR ANGIOGRAM SELECTIVE EACH ADDITIONAL VESSEL  10/28/2022   IR EMBO TUMOR ORGAN ISCHEMIA INFARCT INC GUIDE ROADMAPPING  10/28/2022   IR US  GUIDE VASC ACCESS LEFT  10/28/2022   IR US  GUIDE VASC ACCESS RIGHT  10/28/2022   LAPAROSCOPIC CHOLECYSTECTOMY  12/2006   LAPAROSCOPIC GASTRIC SLEEVE RESECTION     LAPAROSCOPY ABDOMEN DIAGNOSTIC  2008   cut bile duct w/gallbladder OR; had to go in later & fix leak; hospitalized for 2 months   LEFT HEART CATHETERIZATION WITH CORONARY ANGIOGRAM N/A 02/26/2014   Procedure: LEFT HEART  CATHETERIZATION WITH CORONARY ANGIOGRAM;  Surgeon: Candyce GORMAN Reek, MD;  Location: [facility] CATH LAB;  Service: Cardiovascular;  Laterality: N/A;   OVARIAN CYST REMOVAL Right 1999   PVC ABLATION N/A 12/01/2017   Procedure: PVC ABLATION;  Surgeon: Waddell Danelle ORN, MD;  Location: [facility];  Service: Cardiovascular;  Laterality: N/A;   RIGHT HEART CATH N/A 08/05/2017   Procedure: RIGHT HEART CATH;  Surgeon: [name], MD;  Location: [facility];  Service: Cardiovascular;  Laterality: N/A;   RIGHT HEART CATH N/A 11/16/2019   Procedure: RIGHT HEART CATH;  Surgeon: [name], MD;  Location: [facility];  Service: Cardiovascular;  Laterality: N/A;   TUBAL LIGATION  1999     Family History  Problem Relation Age of Onset   Rheum arthritis Mother    Obesity Mother    Emphysema Maternal Grandmother        smoked   Heart disease Maternal Grandmother 1       MI   Allergies Daughter    Colon cancer Neg Hx      Social History   Socioeconomic History   Marital status: Married    Spouse name: Beryl   Number of children: 1   Years of education: Not on file   Highest education level: Associate degree: academic program  Occupational History   Occupation: stay at home mom  Tobacco Use   Smoking status: Never    Passive exposure: Never   Smokeless tobacco: Never  Vaping Use   Vaping status: Never Used  Substance and Sexual Activity   Alcohol use: No   Drug use: No   Sexual activity: Yes    Birth control/protection: Surgical    Comment: BTL, Ablation  Other Topics Concern   Not on file  Social History Narrative   Lives with husband, daughter niece and great nephew   Social Drivers of Health   Financial Resource Strain: Low Risk  (02/25/2024)   Overall Financial Resource Strain (CARDIA)    Difficulty  of Paying Living Expenses: Not very hard  Food Insecurity: No Food Insecurity (02/25/2024)   Hunger Vital Sign    Worried About Running Out of  Food in the Last Year: Never true    Ran Out of Food in the Last Year: Never true  Transportation Needs: No Transportation Needs (02/25/2024)   PRAPARE - Administrator, Civil Service (Medical): No    Lack of Transportation (Non-Medical): No  Physical Activity: Sufficiently Active (02/25/2024)   Exercise Vital Sign    Days of Exercise per Week: 5 days    Minutes of Exercise per Session: 90 min  Stress: Stress Concern Present (02/25/2024)   [employer] of Occupational Health - Occupational Stress Questionnaire    Feeling of Stress: Very much  Social Connections: Socially Isolated (02/25/2024)   Social Connection and Isolation Panel    Frequency of Communication with Friends and Family: Never    Frequency of Social Gatherings with Friends and Family: Never    Attends Religious Services: Never    Database administrator or Organizations: No    Attends Banker Meetings: Never    Marital Status: Married  Catering manager Violence: Not At Risk (02/25/2024)   Humiliation, Afraid, Rape, and Kick questionnaire    Fear of Current or Ex-Partner: No    Emotionally Abused: No    Physically Abused: No    Sexually Abused: No     BP 110/78 (BP Location: Left Arm, Patient Position: Sitting, Cuff Size: Large)   Pulse 82   Ht 5' 5 (1.651 m)   Wt 207 lb (93.9 kg)   SpO2 94%   BMI 34.45 kg/m   Physical Exam:  Well appearing NAD HEENT: Unremarkable Neck:  No JVD, no thyromegally Lymphatics:  No adenopathy Back:  No CVA tenderness Lungs:  Clear HEART:  Regular rate rhythm, no murmurs, no rubs, no clicks Abd:  soft, positive bowel sounds, no organomegally, no rebound, no guarding Ext:  2 plus pulses, no edema, no cyanosis, no clubbing Skin:  No rashes no nodules Neuro:  CN II through XII intact, motor grossly intact  EKG - NSR  DEVICE  Normal device function.  See PaceArt for details.   Assess/Plan:  Symptomatic PVC's - she is s/p a second ablation and  appears to be better. I have recommended watchful waiting, continued beta blocker therapy.   Chronic systolic heart failure - continue GDMT. ICD - her [place] [facility] ICD is working normally.   Danelle Marcques Wrightsman,MD

## 2024-03-16 NOTE — Telephone Encounter (Signed)
 I spoke with [facility] and they reported they no longer dispense Promethazine -codeine  cough syrup

## 2024-03-17 ENCOUNTER — Encounter (HOSPITAL_COMMUNITY): Admission: RE | Admit: 2024-03-17 | Source: Ambulatory Visit

## 2024-03-20 ENCOUNTER — Encounter (HOSPITAL_COMMUNITY)
Admission: RE | Admit: 2024-03-20 | Discharge: 2024-03-20 | Disposition: A | Source: Ambulatory Visit | Attending: Internal Medicine

## 2024-03-20 DIAGNOSIS — I5042 Chronic combined systolic (congestive) and diastolic (congestive) heart failure: Secondary | ICD-10-CM

## 2024-03-22 ENCOUNTER — Ambulatory Visit

## 2024-03-22 ENCOUNTER — Encounter (HOSPITAL_COMMUNITY)
Admission: RE | Admit: 2024-03-22 | Discharge: 2024-03-22 | Disposition: A | Source: Ambulatory Visit | Attending: Internal Medicine

## 2024-03-22 DIAGNOSIS — I5042 Chronic combined systolic (congestive) and diastolic (congestive) heart failure: Secondary | ICD-10-CM | POA: Diagnosis not present

## 2024-03-22 DIAGNOSIS — I428 Other cardiomyopathies: Secondary | ICD-10-CM | POA: Diagnosis not present

## 2024-03-22 NOTE — Progress Notes (Signed)
 During routine PHQ-9 reassessment (score of 9 today), pt requested to have a referral to a psychiatrist to get some help related to psychosocial concerns.  Denies SI/HI.

## 2024-03-23 LAB — CUP PACEART REMOTE DEVICE CHECK
Battery Remaining Longevity: 84 mo
Battery Remaining Percentage: 57 %
Brady Statistic RV Percent Paced: 0 %
Date Time Interrogation Session: 20251029054600
HighPow Impedance: 111 Ohm
Implantable Lead Connection Status: 753985
Implantable Lead Implant Date: 20180312
Implantable Lead Location: 753860
Implantable Lead Model: 293
Implantable Lead Serial Number: 422842
Implantable Pulse Generator Implant Date: 20180312
Lead Channel Impedance Value: 888 Ohm
Lead Channel Setting Pacing Amplitude: 3.5 V
Lead Channel Setting Pacing Pulse Width: 1 ms
Lead Channel Setting Sensing Sensitivity: 0.5 mV
Pulse Gen Serial Number: 226601
Zone Setting Status: 755011

## 2024-03-24 ENCOUNTER — Encounter (HOSPITAL_COMMUNITY): Admission: RE | Admit: 2024-03-24 | Source: Ambulatory Visit

## 2024-03-24 ENCOUNTER — Telehealth (HOSPITAL_COMMUNITY): Payer: Self-pay

## 2024-03-24 NOTE — Telephone Encounter (Signed)
 Patient c/o for 12:30 CR class, no reason given.

## 2024-03-25 ENCOUNTER — Encounter (INDEPENDENT_AMBULATORY_CARE_PROVIDER_SITE_OTHER): Payer: Self-pay | Admitting: Internal Medicine

## 2024-03-27 ENCOUNTER — Telehealth (HOSPITAL_COMMUNITY): Payer: Self-pay

## 2024-03-27 ENCOUNTER — Ambulatory Visit (INDEPENDENT_AMBULATORY_CARE_PROVIDER_SITE_OTHER): Payer: Self-pay | Admitting: Internal Medicine

## 2024-03-27 ENCOUNTER — Telehealth: Payer: Self-pay

## 2024-03-27 ENCOUNTER — Encounter (HOSPITAL_COMMUNITY)

## 2024-03-27 NOTE — Telephone Encounter (Signed)
 Alert remote transmission:  Shock lead impedance out of range.  139 ohms, recent trend 96-125 ohms.  Gore lead   Patient just seen by Dr. Waddell in office 03/16/24 - shock impedance 100ohms. Appeared stable at that time; however, trend continues to show more variability.  Trend shows overall increased elevations as high as 150 earlier in October.  Reviewed with Joey, he recommends review with Dr. Waddell to determine if need for extraction based on the trending data.  [place] does not have an exact recommendation at this time - based on provider decision.

## 2024-03-27 NOTE — Progress Notes (Signed)
 [facility] Note   PCP: Rollene Almarie LABOR, MD Primary Cardiologist: [name], MD (Inactive)  HF Cardiologist: Dr. Cherrie   HPI: [name] is a 48 y.o. 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   Admitted 7/19 with CP Symptoms thought to be related to frequent PVCs. EP consulted and scheduled her for PVC ablation. Echo repeated 11/22/17 and showed improved EF 50-55%. S/p  PVC ablation 12/01/17. Was felt not to be successful.  Was on flecainide  but discontinued due to reduced EF. Placed on amio   Echo 6/20 showed drop in EF back down to 25-30%. Was instructed to f/u in clinic but pt did not. She had outpatient monitor 04/2018 that showed rare PVCs.    Admitted 6/21 with chest pain and shortness of breath. ECHO EF < 20% and normal RV. Had cath with normal cors and preserved cardiac output. Plan was to f/u with EP regarding PVCs. She has remained on AAD therapy w/ amiodarone  + ? blocker therapy.  Admitted to [facility] 4/22 w/ increased dyspnea and volume overload. Also w/ increased PVC burden. Echo showed EF 20-25%, RV normal. She was diuresed w/ IV Lasix  and placed on amiodarone  gtt for PVC suppression. Seen by Dr. Waddell, due to the location of her PVCs, she is not a candidate for re-do ablation.   S/p Barostim 6/22.  Zio 8/22: 13% PVCs (unifocal)  Seen by EP 12/22 and Barostim titrated. Not felt to have any other options for PVCs.   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  -> mexilitene increased to 300 bid  Cardiac PET 6/24 no ischemia/infarction. No active myocardial inflammation/ evidence of sarcoidosis. 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   Follow up with Dr. Waddell 6/25, Zio placed to re-quantify PVC burden. If > 20%, plan repeat PVC ablation. If < 10%, then medical Rx. If 10-20%, would discuss options.   Repeat Zio showed 25% PVC burden. => S/p PVC ablation 7/25 at [facility] with Dr. Sherrye.  CPX 7/25 showed moderate function limitation due to HF and obesity.  Post ablation 4 day Zio (9/25) showed mostly NSR, 7.4% PVC burden  Seen in the ED 02/26/24 with hypotension and near syncope. Orthostatics were positive. All GDMT was held and given IVF. K was 2.9, aggressively supplemented. She was discharged home.  Today she returns for HF follow up. Overall feeling fine. No SOB with activity, doing CR. Feels occasional palpitations at night. Denies abnormal bleeding, CP, dizziness, edema, or PND/Orthopnea. Appetite ok. Weight at home 198 pounds. Taking all medications, takes torsemide  every other day.   Echo today 03/28/24 shows EF 35%, G1DD, RV mildly reduced.  Cardiac Studies  - CPX 7/25: moderate functional limitation 2/2 obesity and HF; pre exercise spirometry suggestive of mild restriction Peak VO2: 11.2 57% predicted peak VO2  Peak VO2 2021: 17.1 (83% of predicted)  VE/VCO2 slope:  31   - Cardiac PET (6/24):    FDG uptake was not observed. LV perfusion is normal. There is no evidence of ischemia. There is no evidence of infarction.   Coronary calcium was absent on  the attenuation correction CT images.   FDG uptake findings are inconsistent with active myocardial inflammation/sarcoidosis.   No gated images or EF due to interference from AICD leads  - Echo (7/23): EF 25-30%  - Echo (1/23): EF 25-30% RV normal   - Echo (4/22): EF 20-25%, RV normal   - PFTs w/ DLOC (4/22) Mild Restriction Normal Diffusion  - CPX (7/21) FVC 2.32 (70%)      FEV1 1.99 (74%)        FEV1/FVC 86 (105%)        MVV 110 (105%) Resting HR: 94 Peak HR: 175   (100% age predicted max HR) BP rest: 126/82 BP peak:  202/78 Peak VO2: 17.6 (86% predicted peak VO2) - corrects to 28.4 for ibw VE/VCO2 slope:  37 OUES: 1.74 Peak RER: 1.08 Ventilatory Threshold: 14.1 (69% predicted or measured peak VO2) VE/MVV:  53% PETCO2 at peak:   O2pulse:  10   (83% predicted O2pulse)  - RHC (6/21):   RA = 4 RV = 32/6 PA = 31/8 (21) PCW = 12 Fick cardiac output/index = 7.0/3.4 Thermo CO/CI = 5.2/2.5 PVR =1.7 (Thermo) Ao sat = 98% PA sat = 67%, 69%  - Echo (6/21): EF < 20% RV normal   - Echo (6/20): EF 25-30%  - Echo (7/19): EF 50-55%  - CPX (12/18): pVO2 14.0 (corrects to 23.0 for IBW) VeVCO2  32 RER 1.0  - Echo (8/18): EF 25%   - CPX (2/18): FVC 2.57 (79%)      FEV1 2.14 (81%)        FEV1/FVC 83 (101%)        MVV 107 (102%) Resting HR: 106 Peak HR: 166   (93% age predicted max HR) BP rest: 136/98 BP peak: 174/88 Peak VO2: 17.1 (85% predicted peak VO2) - corrects to 28.4 for ibw VE/VCO2 slope:  33 OUES: 2.16 Peak RER: 1.09 Ventilatory Threshold: 14.6 (73% predicted or measured peak VO2) VE/MVV:  59% PETCO2 at peak:  33 O2pulse:  10   (83% predicted O2pulse)  - Echo (12/17) 20-25%  - Cath (6/17): revealed normal vessels, moderate elevation in pulmonary artery pressures, and LVEF 25-30%.  - Echo (6/17): 25-30%  - Echo (6/15): EF 35-40%   Review of systems complete and found to be negative unless listed in HPI.    Past Medical History:  Diagnosis Date   Acute on chronic systolic (congestive) heart failure (HCC) 04/29/2017   Acute pain of right shoulder 06/16/2017   ADHD    AICD (automatic cardioverter/defibrillator) present    Anginal pain    Anxiety    Arthritis    right shoulder    Asthma    Back pain    CHF (congestive heart failure) (HCC)    Chronic combined systolic and diastolic heart failure (HCC) 03/05/2014   Closed low lateral malleolus fracture 10/23/2013   Constipation    Cystitis 10/21/2017   Depression    Depression with anxiety 01/20/2013   Diabetes mellitus  without complication (HCC)    Diverticulosis    Dyspnea    comes and goes intermittently mostly with exertion    Dysrhythmia    Edema, lower extremity    Essential hypertension    Prev followed by [name]    Fibroid    age 33   Gallstones    Generalized abdominal cramping    History of cardiomyopathy    Hypertension    IBS (irritable bowel syndrome)    ICD (implantable cardioverter-defibrillator),  single, 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 CPAP intolerance >> smothering Good tolerance of auto device (optimal pressure 12-13 on download).  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 18 g 0   diclofenac  (VOLTAREN ) 75 MG EC tablet Take 1 tablet by mouth twice daily as needed 60 tablet 0   empagliflozin  (JARDIANCE ) 10 MG TABS tablet Take 1 tablet (10 mg total) by mouth  daily before breakfast. 30 tablet 5   ipratropium-albuterol  (DUONEB) 0.5-2.5 (3) MG/3ML SOLN USE 1 AMPULE IN NEBULIZER EVERY 6 HOURS AS NEEDED 360 mL 0   ivabradine  (CORLANOR ) 7.5 MG TABS tablet Take 1 tablet (7.5 mg total) by mouth 2 (two) times daily with a meal.     losartan  (COZAAR ) 25 MG tablet Take 0.5 tablets (12.5 mg total) by mouth at bedtime. 15 tablet 3   metoprolol  succinate (TOPROL  XL) 25 MG 24 hr tablet Take 0.5 tablets (12.5 mg total) by mouth daily. 15 tablet 5   nitroGLYCERIN  (NITROSTAT ) 0.4 MG SL tablet Place 1 tablet (0.4 mg total) under the tongue every 5 (five) minutes as needed for chest pain. 75 tablet 0   OVER THE COUNTER MEDICATION Take 1 capsule by mouth daily. BARIATRIC VITAMIN     tizanidine  (ZANAFLEX ) 2 MG capsule Take 2 mg by mouth as needed for muscle spasms.     torsemide  (DEMADEX ) 20 MG tablet Take 1 tablet (20 mg total) by mouth daily as needed (for weight gain and swelling). (Patient taking differently: Take 20 mg by mouth every other day.)     Ubrogepant  (UBRELVY ) 100 MG TABS Take 1 tablet (100 mg total) by mouth daily as needed (migraine). 30 tablet 3   Vitamin D , Ergocalciferol , (DRISDOL ) 1.25 MG (50000 UNIT) CAPS capsule Take 1 capsule by mouth once a week 12 capsule 0   HYDROcodone  bit-homatropine (HYCODAN) 5-1.5 MG/5ML syrup Take 5 mLs by mouth every 6 (six) hours as needed. (Patient not taking: Reported on 03/28/2024) 120 mL 0   No current facility-administered medications for this encounter.   Allergies  Allergen Reactions   Vancomycin  Other (See Comments)    did something to my kidneys, PROGRESSED TO KIDNEY FAILURE!!   Aspirin  Other (See Comments)    Wheezing, (Pt states that she just  wheezes some when she takes aspirin  by itself but she can take aspirin  in a combination product).   Contrast Media [Iodinated Contrast Media] Other (See Comments)    Multiple CT contrast studies done over 2 weeks caused ARF   Ciprofloxacin Itching and Rash    Cyclobenzaprine  Other (See Comments)    Can not tolerate   Farxiga  [Dapagliflozin] Rash   Sulfa Antibiotics Itching and Rash   Social History   Socioeconomic History   Marital status: Married    Spouse name: [name]   Number of children: 1   Years of education: Not on file   Highest education level: Associate degree: academic program  Occupational History   Occupation: stay at home mom  Tobacco Use   Smoking status: Never    Passive exposure: Never   Smokeless tobacco: Never  Vaping Use   Vaping status: Never Used  Substance and Sexual Activity   Alcohol use: No   Drug use: No   Sexual activity: Yes    Birth control/protection: Surgical    Comment: BTL, Ablation  Other Topics Concern   Not on file  Social History Narrative   Lives with husband, daughter niece and great nephew   Social Drivers of Corporate Investment Banker Strain: Low Risk  (02/25/2024)   Overall Financial Resource Strain (CARDIA)    Difficulty of Paying Living Expenses: Not very hard  Food Insecurity: No Food Insecurity (02/25/2024)   Hunger Vital Sign    Worried About Running Out of Food in the Last Year: Never true    Ran Out of Food in the Last Year: Never true  Transportation Needs: No Transportation Needs (02/25/2024)   PRAPARE - Administrator, Civil Service (Medical): No    Lack of Transportation (Non-Medical): No  Physical Activity: Sufficiently Active (02/25/2024)   Exercise Vital Sign    Days of Exercise per Week: 5 days    Minutes of Exercise per Session: 90 min  Stress: Stress Concern Present (02/25/2024)   [employer] of Occupational Health - Occupational Stress Questionnaire    Feeling of Stress: Very much  Social Connections: Socially Isolated (02/25/2024)   Social Connection and Isolation Panel    Frequency of Communication with Friends and Family: Never    Frequency of Social Gatherings with Friends and Family: Never    Attends Religious Services: Never    Automotive Engineer or Organizations: No    Attends Banker Meetings: Never    Marital Status: Married  Catering Manager Violence: Not At Risk (02/25/2024)   Humiliation, Afraid, Rape, and Kick questionnaire    Fear of Current or Ex-Partner: No    Emotionally Abused: No    Physically Abused: No    Sexually Abused: No   Family History  Problem Relation Age of Onset   Rheum arthritis Mother    Obesity Mother    Emphysema Maternal Grandmother        smoked   Heart disease Maternal Grandmother 48       MI   Allergies Daughter    Colon cancer Neg Hx    Wt Readings from Last 3 Encounters:  03/28/24 92.7 kg (204 lb 6.4 oz)  03/16/24 93.9 kg (207 lb)  03/15/24 90.3 kg (199 lb)   BP 110/72   Pulse 78   Ht 5' 5 (1.651 m)   Wt 92.7 kg (204 lb 6.4 oz)   SpO2 98%   BMI 34.01 kg/m   PHYSICAL EXAM:  General:  NAD. No resp difficulty, walked into clinic HEENT: Normal Neck: Supple. No JVD. Cor: Regular rate & rhythm. No rubs, gallops or murmurs. Lungs: Clear Abdomen: Soft, nontender, nondistended.  Extremities: No cyanosis, clubbing, rash, edema Neuro: Alert & oriented x 3, moves all 4 extremities w/o difficulty. Affect pleasant.  Device interrogation (personally reviewed): HL score 7, 0.7 hr/day activity, no VT, average HR 83 bpm  ASSESSMENT & PLAN: 1. Chronic systolic HF in setting of TTN CM:   - due to TTN CM, onset 1999.  - S/P [employer] ICD 07/2016 - s/p Barostim 6/22 - LHC 6/17 No CAD - Echo 12/17 EF 20-25% - s/p ICD 3/18.  - Echo 8/18 EF 25-30%  - CPX 12/18 showed moderate limitation due mostly to obesity with some HF component - Echo 7/23 EF 25-30% - Cardiac PET 6/24: no ischemia/infarction. 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 Suspect progressive course. Saw Dr.  Joseph>>Variant is interpreted as variant of unknown clinical significance. Per Dr. Thera, as there are no affected family members to determine if this variant segregates with disease, genetic testing her daughter for this familial TTN variant is unwarranted. - Unclear if PVCs are a result of her CM or contributing to it (or both). Failed previous PVC ablation. C/w high burden despite 2 AAD therapy regimen (amiodarone  and mexiletine), s/p ablation 7/25.  - Echo today 03/28/24 showed EF 35%, RV mildly down (echo after PVC suppression). - NYHA I-II, volume okay on exam and by device. GDMT limited by AKI and orthostasis. - Restart spiro 25 mg daily. - Continue torsemide  20 mg every other day, may be able to switch to PRN with addition of MRA. - Continue Jardiance  10 mg daily. - Continue Toprol  XL 12.5 mg daily. - Continue Corlanor  7.5 mg bid. - Continue losartan  12.5 mg at bedtime. - Suspect she may need advanced therapies in future but currently too early.  - Can resume cardiac rehab - BMET in 1 week. Labs reviewed from 03/08/24 are stable, K 3.9, SCr 0.95.  2. Frequent PVCs/Bigeminy - Holter Monitor 6/19 with 30% PVCs with one primary morphology.  - S/p PVC ablation 11/2017 with Dr Waddell unsuccessful - Off flecainide  with EF down and persistent PVCs.  - Zio Patch 04/2019 > 17% PVCs and >8% couplets.  - Zio Patch 11/28/2019 8.5 % PVCs - Zio 9/22 13% PVCs (unifocal)  - Zio 12/23 showed frequent NSVT, 13% PVC burden (see discussion above) - Cardiac PET 6/24 negative for sarcoid  - Saw Dr. Waddell (EP). Zio showed 25% PVCR burden - s/p PVC ablation 7/25 - Zio 9/25 showed PVC burden down to 7.4% - Now off amio and mexiletine   3. Obesity - s/p gastric sleeve at [facility] 09/20/17  - Body mass index is 34.01 kg/m. - Working with healthy weight and wellness - Congratulated on weight loss efforts so far - Planning on starting GLP1   4. Daytime Fatigue - Sleep study in 2019 and again in 2021  were both negative for sleep apnea  - Recently improved  5. DM 2 - A1C 5.2 - On SGLT2i - Per PCP  Doing well! Follow up in 4 months with [name]  Dawson Hollman M Anola Mcgough, FNP  9:46 AM

## 2024-03-27 NOTE — Telephone Encounter (Signed)
 Called to confirm/remind patient of their appointment at the [facility] on 03/28/24.   Appointment:   [] Confirmed  [x] Left mess   [] No answer/No voice mail  [] VM Full/unable to leave message  [] Phone not in service  And to bring in all medications and/or complete list.

## 2024-03-28 ENCOUNTER — Ambulatory Visit (HOSPITAL_COMMUNITY)
Admission: RE | Admit: 2024-03-28 | Discharge: 2024-03-28 | Disposition: A | Discharge status: Home / Self Care | Source: Ambulatory Visit | Attending: Family Medicine | Admitting: Family Medicine

## 2024-03-28 ENCOUNTER — Ambulatory Visit (HOSPITAL_BASED_OUTPATIENT_CLINIC_OR_DEPARTMENT_OTHER)
Admission: RE | Admit: 2024-03-28 | Discharge: 2024-03-28 | Disposition: A | Discharge status: Home / Self Care | Source: Ambulatory Visit | Attending: Family Medicine | Admitting: Family Medicine

## 2024-03-28 ENCOUNTER — Encounter (HOSPITAL_COMMUNITY): Payer: Self-pay

## 2024-03-28 VITALS — BP 110/72 | HR 78 | Ht 65.0 in | Wt 204.4 lb

## 2024-03-28 DIAGNOSIS — Z95810 Presence of automatic (implantable) cardiac defibrillator: Secondary | ICD-10-CM | POA: Diagnosis not present

## 2024-03-28 DIAGNOSIS — E119 Type 2 diabetes mellitus without complications: Secondary | ICD-10-CM | POA: Insufficient documentation

## 2024-03-28 DIAGNOSIS — Z6834 Body mass index (BMI) 34.0-34.9, adult: Secondary | ICD-10-CM | POA: Diagnosis not present

## 2024-03-28 DIAGNOSIS — I5022 Chronic systolic (congestive) heart failure: Secondary | ICD-10-CM

## 2024-03-28 DIAGNOSIS — I493 Ventricular premature depolarization: Secondary | ICD-10-CM | POA: Diagnosis not present

## 2024-03-28 DIAGNOSIS — I110 Hypertensive heart disease with heart failure: Secondary | ICD-10-CM | POA: Insufficient documentation

## 2024-03-28 DIAGNOSIS — G4733 Obstructive sleep apnea (adult) (pediatric): Secondary | ICD-10-CM | POA: Insufficient documentation

## 2024-03-28 DIAGNOSIS — R5383 Other fatigue: Secondary | ICD-10-CM | POA: Diagnosis not present

## 2024-03-28 DIAGNOSIS — Z86718 Personal history of other venous thrombosis and embolism: Secondary | ICD-10-CM | POA: Insufficient documentation

## 2024-03-28 DIAGNOSIS — Z79899 Other long term (current) drug therapy: Secondary | ICD-10-CM | POA: Insufficient documentation

## 2024-03-28 DIAGNOSIS — Z7984 Long term (current) use of oral hypoglycemic drugs: Secondary | ICD-10-CM | POA: Diagnosis not present

## 2024-03-28 DIAGNOSIS — E669 Obesity, unspecified: Secondary | ICD-10-CM | POA: Diagnosis not present

## 2024-03-28 DIAGNOSIS — Z9884 Bariatric surgery status: Secondary | ICD-10-CM | POA: Insufficient documentation

## 2024-03-28 LAB — ECHOCARDIOGRAM COMPLETE
Area-P 1/2: 4.96 cm2
Calc EF: 45.2 %
Est EF: 35
S' Lateral: 3.8 cm
Single Plane A2C EF: 43.8 %
Single Plane A4C EF: 45 %

## 2024-03-28 MED ORDER — SPIRONOLACTONE 25 MG PO TABS
25.0000 mg | ORAL_TABLET | Freq: Every day | ORAL | 5 refills | Status: AC
Start: 2024-03-28 — End: ?

## 2024-03-28 NOTE — Patient Instructions (Signed)
 Medication Changes:  RESTART SPIRONOLACTONE  25MG  ONCE DAILY   Lab Work:  RETURN FOR LABS AS SCHEDULED IN 1 WEEK   Follow-Up in: 4 MONTHS PLEASE CALL OUR OFFICE AROUND JANUARY TO GET SCHEDULED FOR YOUR APPOINTMENT. PHONE NUMBER IS [phone] OPTION 2   At the [facility], you and your health needs are our priority. We have a designated team specialized in the treatment of Heart Failure. This Care Team includes your primary Heart Failure Specialized Cardiologist (physician), Advanced Practice Providers (APPs- Physician Assistants and Nurse Practitioners), and Pharmacist who all work together to provide you with the care you need, when you need it.   You may see any of the following providers on your designated Care Team at your next follow up:  Dr. [name] Dr. Ezra Shuck Dr. Odis Brownie Greig Mosses, NP Caffie Shed, [place] [facility] [place], [place] Beckey Coe, NP Jordan Lee, NP Tinnie Redman, PharmD   Please be sure to bring in all your medications bottles to every appointment.   Need to Contact Us :  If you have any questions or concerns before your next appointment please send us  a message through [place] or call our office at [phone].    TO LEAVE A MESSAGE FOR THE NURSE SELECT OPTION 2, PLEASE LEAVE A MESSAGE INCLUDING: YOUR NAME DATE OF BIRTH CALL BACK NUMBER REASON FOR CALL**this is important as we prioritize the call backs  YOU WILL RECEIVE A CALL BACK THE SAME DAY AS LONG AS YOU CALL BEFORE 4:00 PM

## 2024-03-28 NOTE — Progress Notes (Signed)
 Cardiac Individual Treatment Plan  Patient Details  Name: [name] MRN: [id number] Date of Birth: Jan 15, 1976 Referring Provider:   Flowsheet Row INTENSIVE CARDIAC REHAB ORIENT from 01/03/2024 in [facility]  Referring Provider [name], MD    Initial Encounter Date:  Flowsheet Row INTENSIVE CARDIAC REHAB ORIENT from 01/03/2024 in [facility]  Date 01/03/24    Visit Diagnosis: No diagnosis found.  [name] Home Medications on Admission:  Current Outpatient Medications:    albuterol  (VENTOLIN  HFA) 108 (90 Base) MCG/ACT inhaler, Inhale 1-2 puffs into the lungs every 6 (six) hours as needed for wheezing or shortness of breath., Disp: 18 g, Rfl: 0   diclofenac  (VOLTAREN ) 75 MG EC tablet, Take 1 tablet by mouth twice daily as needed, Disp: 60 tablet, Rfl: 0   empagliflozin  (JARDIANCE ) 10 MG TABS tablet, Take 1 tablet (10 mg total) by mouth daily before breakfast., Disp: 30 tablet, Rfl: 5   HYDROcodone  bit-homatropine (HYCODAN) 5-1.5 MG/5ML syrup, Take 5 mLs by mouth every 6 (six) hours as needed. (Patient not taking: Reported on 03/28/2024), Disp: 120 mL, Rfl: 0   ipratropium-albuterol  (DUONEB) 0.5-2.5 (3) MG/3ML SOLN, USE 1 AMPULE IN NEBULIZER EVERY 6 HOURS AS NEEDED, Disp: 360 mL, Rfl: 0   ivabradine  (CORLANOR ) 7.5 MG TABS tablet, Take 1 tablet (7.5 mg total) by mouth 2 (two) times daily with a meal., Disp: , Rfl:    losartan  (COZAAR ) 25 MG tablet, Take 0.5 tablets (12.5 mg total) by mouth at bedtime., Disp: 15 tablet, Rfl: 3   metoprolol  succinate (TOPROL  XL) 25 MG 24 hr tablet, Take 0.5 tablets (12.5 mg total) by mouth daily., Disp: 15 tablet, Rfl: 5   nitroGLYCERIN  (NITROSTAT ) 0.4 MG SL tablet, Place 1 tablet (0.4 mg total) under the tongue every 5 (five) minutes as needed for chest pain., Disp: 75 tablet, Rfl: 0   OVER THE COUNTER MEDICATION, Take 1 capsule by  mouth daily. BARIATRIC VITAMIN, Disp: , Rfl:    spironolactone  (ALDACTONE ) 25 MG tablet, Take 1 tablet (25 mg total) by mouth daily., Disp: 30 tablet, Rfl: 5   tizanidine  (ZANAFLEX ) 2 MG capsule, Take 2 mg by mouth as needed for muscle spasms., Disp: , Rfl:    torsemide  (DEMADEX ) 20 MG tablet, Take 1 tablet (20 mg total) by mouth daily as needed (for weight gain and swelling). (Patient taking differently: Take 20 mg by mouth every other day.), Disp: , Rfl:    Ubrogepant  (UBRELVY ) 100 MG TABS, Take 1 tablet (100 mg total) by mouth daily as needed (migraine)., Disp: 30 tablet, Rfl: 3   Vitamin D , Ergocalciferol , (DRISDOL ) 1.25 MG (50000 UNIT) CAPS capsule, Take 1 capsule by mouth once a week, Disp: 12 capsule, Rfl: 0  Past Medical History: Past Medical History:  Diagnosis Date   Acute on chronic systolic (congestive) heart failure (HCC) 04/29/2017   Acute pain of right shoulder 06/16/2017   ADHD    AICD (automatic cardioverter/defibrillator) present    Anginal pain    Anxiety    Arthritis    right shoulder    Asthma    Back pain    CHF (congestive heart failure) (HCC)    Chronic combined systolic and diastolic heart failure (HCC) 03/05/2014   Closed low lateral malleolus fracture 10/23/2013   Constipation    Cystitis 10/21/2017   Depression    Depression with anxiety 01/20/2013   Diabetes mellitus without complication (  HCC)    Diverticulosis    Dyspnea    comes and goes intermittently mostly with exertion    Dysrhythmia    Edema, lower extremity    Essential hypertension    Prev followed by [name]    Fibroid    age 48   Gallstones    Generalized abdominal cramping    History of cardiomyopathy    Hypertension    IBS (irritable bowel syndrome)    ICD (implantable cardioverter-defibrillator), single, 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 CPAP intolerance >> smothering Good tolerance of auto device (optimal pressure 12-13 on download).  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   PVC (premature ventricular contraction) 06/23/2016   Seizures (HCC)    as a child (08/03/2016)   SOB (shortness of breath)    Termination of pregnancy    due to cardiac risk   Vitamin B12 deficiency    Vitamin D  deficiency     Tobacco Use: Social History   Tobacco Use  Smoking Status Never   Passive exposure: Never  Smokeless Tobacco Never    Labs: Review Flowsheet  More data exists      Latest Ref Rng & Units 08/26/2020 12/13/2021 12/14/2021 06/11/2023 07/16/2023  Labs for ITP Cardiac and Pulmonary Rehab  Cholestrol 0 - 200 mg/dL - - - 835  -  LDL (calc) 0 - 99 mg/dL - - - 83  -  HDL-C >60.99 mg/dL - - - 32.99  -  Trlycerides 0.0 - 149.0 mg/dL - 84  - 33.9  -  Hemoglobin A1c 4.8 - 5.6 % 5.5  - - 5.5  5.2   Bicarbonate 20.0 - 28.0 mmol/L - - 24.7  - -  TCO2 22 - 32 mmol/L - - 26  - -  O2 Saturation % - - 100  - -    Capillary Blood Glucose: Lab Results  Component Value Date   GLUCAP 113 (H) 02/26/2024   GLUCAP 117 (H) 02/11/2024   GLUCAP 103 (H) 01/26/2024   GLUCAP 105 (H) 01/12/2024   GLUCAP 108 (H) 01/12/2024     Exercise Target Goals: Exercise Program Goal: Individual exercise prescription set using results from initial 6 min walk test and THRR while considering  patient's activity  barriers and safety.   Exercise Prescription Goal: Initial exercise prescription builds to 30-45 minutes a day of aerobic activity, 2-3 days per week.  Home exercise guidelines will be given to patient during program as part of exercise prescription that the participant will acknowledge.  Activity Barriers & Risk Stratification:   6 Minute Walk:   Oxygen  Initial Assessment:   Oxygen  Re-Evaluation:   Oxygen  Discharge (Final Oxygen  Re-Evaluation):   Initial Exercise Prescription:   Perform Capillary Blood Glucose  checks as needed.  Exercise Prescription Changes:   Exercise Prescription Changes     Row Name 02/04/24 1400 02/18/24 1500 03/08/24 1500 03/21/24 1100       Response to Exercise   Blood Pressure (Admit) 102/66 108/80 136/78 102/60    Blood Pressure (Exercise) 108/78 -- -- 104/68    Blood Pressure (Exit) 98/58 108/60 104/66 106/60    Heart Rate (Admit) 81 bpm 60 bpm 95 bpm 99 bpm    Heart Rate (Exercise) 123 bpm 89 bpm 141 bpm 120 bpm    Heart Rate (Exit) 90 bpm 70 bpm 104 bpm 107 bpm    Rating of Perceived Exertion (Exercise) 13 12 11 11     Symptoms None None None 8/10 left knee pain    Comments Reviewed METs and goals Reviewed METs Reviewed METs and goals Reviewed METs    Duration Continue with 30 min of aerobic exercise without signs/symptoms of physical distress. Continue with 30 min of aerobic exercise without signs/symptoms of physical distress. Continue with 30 min of aerobic exercise without signs/symptoms of physical distress. Progress to 30 minutes of  aerobic without signs/symptoms of physical distress    Intensity THRR unchanged THRR unchanged THRR unchanged THRR unchanged      Progression   Progression Continue to progress workloads to maintain intensity without signs/symptoms of physical distress. Continue to progress workloads to maintain intensity without signs/symptoms of physical distress. Continue to progress workloads to maintain intensity  without signs/symptoms of physical distress. Continue to progress workloads to maintain intensity without signs/symptoms of physical distress.    Average METs 2.7 3.15 3.4 4.06      Resistance Training   Training Prescription Yes Yes No Yes    Weight 2 lbs 2 lbs No ws on Wednesdays 2 lbs    Reps 10-15 10-15 -- 10-15    Time 5 Minutes 5 Minutes -- 5 Minutes      Interval Training   Interval Training No No No No      NuStep   Level 2 4 4 4     SPM 102 90 96 --    Minutes 15 15 15 6     METs 2.5 2.8 3.1 --  knee pain, did not want to continue on nustep      Track   Laps 15 17 21 24     Minutes 15 15 15 20     METs 2.91 3.32 3.68 4.06      Home Exercise Plan   Plans to continue exercise at -- -- -- --    Frequency -- -- -- --    Initial Home Exercises Provided -- -- -- --       Exercise Comments:   Exercise Comments     Row Name 02/04/24 1440 02/18/24 1500 03/08/24 1500 03/20/24 1500     Exercise Comments Reviewed METs and goals. METs slipped a bit today due to doing education during exercise. Pt has peak METs of 4.5 to date. Reviewed METs. Pt is making good progress. Reviewed METs and goals. Pt is making slow progess on her MET levels. Pt is feeling better after having to go the ED for low blood pressure at the heart walk. He workouts are not always consistant and it depends on how she is feeling that day. Reviewed METs. Pt is returning after being out sick. Pt also having significant knee pain. Did more time on track today. METs slowing imrpoving. Increasing METs has been difficilt due to her ED visit and recent illness.  Exercise Goals and Review:   Exercise Goals Re-Evaluation :  Exercise Goals Re-Evaluation     Row Name 02/04/24 1438 03/08/24 1500           Exercise Goal Re-Evaluation   Exercise Goals Review Increase Physical Activity;Understanding of Exercise Prescription;Increase Strength and Stamina;Knowledge and understanding of Target Heart Rate Range  (THRR);Able to understand and use rate of perceived exertion (RPE) scale Increase Physical Activity;Understanding of Exercise Prescription;Increase Strength and Stamina;Knowledge and understanding of Target Heart Rate Range (THRR);Able to understand and use rate of perceived exertion (RPE) scale      Comments Reviewed METs and goals. Pt voices progress on her goal of imrpoved strength and stamina. Pt voices that she can go up the stairs more easily, has more energy and is not taking naps in the afternoon anymore. Reviewed METs and goals. Pt continues to voice progress on her goal of improved strength and stamina. Pt continues to voice that she can do the stairs more easily, has more energy and is not taking naps in the afternoon anymore. Pt has also made progress on her goal of a better midset. Pt has also made progress on her goal of weight loss and lost 8.2 lbs to date.      Expected Outcomes Will continue to monitor the patient and progress exercise workloads as tolerated. Will continue to monitor the patient and progress exercise workloads as tolerated.         Discharge Exercise Prescription (Final Exercise Prescription Changes):  Exercise Prescription Changes - 03/21/24 1100       Response to Exercise   Blood Pressure (Admit) 102/60    Blood Pressure (Exercise) 104/68    Blood Pressure (Exit) 106/60    Heart Rate (Admit) 99 bpm    Heart Rate (Exercise) 120 bpm    Heart Rate (Exit) 107 bpm    Rating of Perceived Exertion (Exercise) 11    Symptoms 8/10 left knee pain    Comments Reviewed METs    Duration Progress to 30 minutes of  aerobic without signs/symptoms of physical distress    Intensity THRR unchanged      Progression   Progression Continue to progress workloads to maintain intensity without signs/symptoms of physical distress.    Average METs 4.06      Resistance Training   Training Prescription Yes    Weight 2 lbs    Reps 10-15    Time 5 Minutes      Interval Training    Interval Training No      NuStep   Level 4    Minutes 6    METs --   knee pain, did not want to continue on nustep     Track   Laps 24    Minutes 20    METs 4.06          Nutrition:  Target Goals: Understanding of nutrition guidelines, daily intake of sodium 1500mg , cholesterol 200mg , calories 30% from fat and 7% or less from saturated fats, daily to have 5 or more servings of fruits and vegetables.  Biometrics:    Nutrition Therapy Plan and Nutrition Goals:   Nutrition Assessments:  MEDIFICTS Score Key: >=70 Need to make dietary changes  40-70 Heart Healthy Diet <= 40 Therapeutic Level Cholesterol Diet   Flowsheet Row INTENSIVE CARDIAC REHAB from 01/12/2024 in [facility]  Picture Your Plate Total Score on Admission 61   Picture Your Plate Scores: <59  Unhealthy dietary pattern with much room for improvement. 41-50 Dietary pattern unlikely to meet recommendations for good health and room for improvement. 51-60 More healthful dietary pattern, with some room for improvement.  >60 Healthy dietary pattern, although there may be some specific behaviors that could be improved.    Nutrition Goals Re-Evaluation:   Nutrition Goals Re-Evaluation:   Nutrition Goals Discharge (Final Nutrition Goals Re-Evaluation):   Psychosocial: Target Goals: Acknowledge presence or absence of significant depression and/or stress, maximize coping skills, provide positive support system. Participant is able to verbalize types and ability to use techniques and skills needed for reducing stress and depression.  Initial Review & Psychosocial Screening:   Quality of Life Scores:  Scores of 19 and below usually indicate a poorer quality of life in these areas.  A difference of  2-3 points is a clinically meaningful difference.  A difference of 2-3 points in the total score of the Quality of Life Index has been associated with  significant improvement in overall quality of life, self-image, physical symptoms, and general health in studies assessing change in quality of life.  PHQ-9: Review Flowsheet  More data exists      03/22/2024 02/25/2024 01/03/2024 05/06/2023 12/25/2022  Depression screen PHQ 2/9  Decreased Interest 1 0 2 2 0 2  Down, Depressed, Hopeless 1 0 2 2 3 3   PHQ - 2 Score 2 0 4 4 3 5   Altered sleeping 1 0 2 3 3 3   Tired, decreased energy 2 0 2 2 2 3   Change in appetite 0 0 3 2 0 2  Feeling bad or failure about yourself  1 0 3 2 2 2   Trouble concentrating 3 0 2 3 2 3   Moving slowly or fidgety/restless 0 0 2 1 0 0  Suicidal thoughts 0 0 1 0 0 1  PHQ-9 Score 9 0 19 17 12 19   Difficult doing work/chores Not difficult at all Not difficult at all Somewhat difficult Somewhat difficult Very difficult    Details       Multiple values from one day are sorted in reverse-chronological order        Interpretation of Total Score  Total Score Depression Severity:  1-4 = Minimal depression, 5-9 = Mild depression, 10-14 = Moderate depression, 15-19 = Moderately severe depression, 20-27 = Severe depression   Psychosocial Evaluation and Intervention:   Psychosocial Re-Evaluation:  Psychosocial Re-Evaluation     Row Name 01/31/24 1406 02/23/24 1653 03/24/24 1437         Psychosocial Re-Evaluation   Current issues with Current Depression;Current Anxiety/Panic;Current Stress Concerns Current Depression;Current Anxiety/Panic;Current Stress Concerns Current Depression;Current Anxiety/Panic;Current Stress Concerns     Comments Angie states she continues to feel more confident in her ability to walk, climb stairs, and that her depression symptoms continue to decrease. Angie states she continues to feel more confident in her ability to walk, climb stairs, and that her depression symptoms continue to decrease. Angie states she continues to feel more confident in her ability to walk, climb stairs, and that her  depression symptoms continue to decrease.  PHQ-9 reassessment shows a score of 9 reduced from 19.  Still denies need for any depression intervention/treatment at this time.     Expected Outcomes -- Angie will continue to exhibit a positive outlook with good coping skills. Angie will continue to exhibit a positive outlook with good coping skills.     Interventions -- Stress management education;Relaxation education;Encouraged to attend Cardiac Rehabilitation for the exercise  Stress management education;Relaxation education;Encouraged to attend Cardiac Rehabilitation for the exercise     Continue Psychosocial Services  -- Follow up required by staff Follow up required by staff        Psychosocial Discharge (Final Psychosocial Re-Evaluation):  Psychosocial Re-Evaluation - 03/24/24 1437       Psychosocial Re-Evaluation   Current issues with Current Depression;Current Anxiety/Panic;Current Stress Concerns    Comments Angie states she continues to feel more confident in her ability to walk, climb stairs, and that her depression symptoms continue to decrease.  PHQ-9 reassessment shows a score of 9 reduced from 19.  Still denies need for any depression intervention/treatment at this time.    Expected Outcomes Angie will continue to exhibit a positive outlook with good coping skills.    Interventions Stress management education;Relaxation education;Encouraged to attend Cardiac Rehabilitation for the exercise    Continue Psychosocial Services  Follow up required by staff          Vocational Rehabilitation: Provide vocational rehab assistance to qualifying candidates.   Vocational Rehab Evaluation & Intervention:   Education: Education Goals: Education classes will be provided on a weekly basis, covering required topics. Participant will state understanding/return demonstration of topics presented.    Education     Row Name 01/28/24 1300     Education   Cardiac Education Topics Pritikin    Writer General Education   General Education Hypertension and Heart Disease   Instruction Review Code 1- Verbalizes Understanding   Class Start Time 1403   Class Stop Time 1440   Class Time Calculation (min) 37 min    Row Name 01/31/24 1300     Education   Cardiac Education Topics Pritikin   Geographical Information Systems Officer Psychosocial   Psychosocial Workshop Focused Goals, Sustainable Changes   Instruction Review Code 1- Verbalizes Understanding   Class Start Time 1359   Class Stop Time 1434   Class Time Calculation (min) 35 min    Row Name 02/02/24 1300     Education   Cardiac Education Topics Pritikin   Customer Service Manager   Weekly Topic Comforting Weekend Breakfasts   Instruction Review Code 1- Verbalizes Understanding   Class Start Time 1358   Class Stop Time 1439   Class Time Calculation (min) 41 min    Row Name 02/04/24 1300     Education   Cardiac Education Topics Pritikin   Nurse, Children's Exercise Physiologist   Select Nutrition   Nutrition Dining Out - Part 1   Instruction Review Code 1- Verbalizes Understanding   Class Start Time 1400   Class Stop Time 1440   Class Time Calculation (min) 40 min    Row Name 02/09/24 1300     Education   Cardiac Education Topics Pritikin   Secondary School Teacher School   Educator Nurse;Respiratory Therapist   Weekly Topic Fast Evening Meals   Instruction Review Code 1- Verbalizes Understanding   Class Start Time 1356   Class Stop Time 1428   Class Time Calculation (min) 32 min    Row Name 02/11/24 1300     Education   Cardiac Education Topics Pritikin   Psychologist, Sport And Exercise     Core  Videos   Educator Exercise Physiologist   Select Nutrition   Nutrition Vitamins and Minerals   Instruction Review Code 1- Verbalizes  Understanding   Class Start Time 1357   Class Stop Time 1440   Class Time Calculation (min) 43 min    Row Name 02/16/24 1300     Education   Cardiac Education Topics Pritikin   Customer Service Manager   Weekly Topic International Cuisine- Spotlight on the [employer] Zones   Instruction Review Code 1- Verbalizes Understanding   Class Start Time 1358   Class Stop Time 1435   Class Time Calculation (min) 37 min    Row Name 02/25/24 1300     Education   Cardiac Education Topics Pritikin   Nurse, Children's Exercise Physiologist   Select Psychosocial   Psychosocial How Our Thoughts Can Heal Our Hearts   Instruction Review Code 1- Verbalizes Understanding   Class Start Time 1400   Class Stop Time 1436   Class Time Calculation (min) 36 min    Row Name 03/01/24 1300     Education   Cardiac Education Topics Pritikin   Customer Service Manager   Weekly Topic Powerhouse Plant-Based Proteins   Instruction Review Code 1- Verbalizes Understanding   Class Start Time 1400   Class Stop Time 1436   Class Time Calculation (min) 36 min    Row Name 03/03/24 1400     Education   Cardiac Education Topics Pritikin   Licensed Conveyancer Nutrition   Nutrition Facts on Fat   Instruction Review Code 1- Verbalizes Understanding   Class Start Time 1357   Class Stop Time 1436   Class Time Calculation (min) 39 min    Row Name 03/06/24 1300     Education   Cardiac Education Topics Pritikin   Geographical Information Systems Officer Psychosocial   Psychosocial Workshop From Head to Heart: The Power of a Healthy Outlook   Instruction Review Code 1- Verbalizes Understanding   Class Start Time 1400   Class Stop Time 1446   Class Time Calculation (min) 46 min    Row Name 03/08/24 1300     Education    Cardiac Education Topics Pritikin   Customer Service Manager   Weekly Topic Tasty Appetizers and Snacks   Instruction Review Code 1- Verbalizes Understanding   Class Start Time 1400   Class Stop Time 1442   Class Time Calculation (min) 42 min    Row Name 03/20/24 1300     Education   Cardiac Education Topics Pritikin   Geographical Information Systems Officer Exercise   Exercise Workshop Location Manager and Fall Prevention   Instruction Review Code 1- Verbalizes Understanding   Class Start Time 1408   Class Stop Time 1500   Class Time Calculation (min) 52 min    Row Name 03/22/24 1300     Education   Cardiac Education Topics Pritikin   Customer Service Manager   Weekly Topic Fast and Healthy Breakfasts   Instruction Review Code 1-  Verbalizes Understanding   Class Start Time 1400   Class Stop Time 1442   Class Time Calculation (min) 42 min      Core Videos: Exercise    Move It!  Clinical staff conducted group or individual video education with verbal and written material and guidebook.  Patient learns the recommended Pritikin exercise program. Exercise with the goal of living a long, healthy life. Some of the health benefits of exercise include controlled diabetes, healthier blood pressure levels, improved cholesterol levels, improved heart and lung capacity, improved sleep, and better body composition. Everyone should speak with their doctor before starting or changing an exercise routine.  Biomechanical Limitations Clinical staff conducted group or individual video education with verbal and written material and guidebook.  Patient learns how biomechanical limitations can impact exercise and how we can mitigate and possibly overcome limitations to have an impactful and balanced exercise routine.  Body Composition Clinical staff conducted group or individual  video education with verbal and written material and guidebook.  Patient learns that body composition (ratio of muscle mass to fat mass) is a key component to assessing overall fitness, rather than body weight alone. Increased fat mass, especially visceral belly fat, can put us  at increased risk for metabolic syndrome, type 2 diabetes, heart disease, and even death. It is recommended to combine diet and exercise (cardiovascular and resistance training) to improve your body composition. Seek guidance from your physician and exercise physiologist before implementing an exercise routine.  Exercise Action Plan Clinical staff conducted group or individual video education with verbal and written material and guidebook.  Patient learns the recommended strategies to achieve and enjoy long-term exercise adherence, including variety, self-motivation, self-efficacy, and positive decision making. Benefits of exercise include fitness, good health, weight management, more energy, better sleep, less stress, and overall well-being.  Medical   Heart Disease Risk Reduction Clinical staff conducted group or individual video education with verbal and written material and guidebook.  Patient learns our heart is our most vital organ as it circulates oxygen , nutrients, white blood cells, and hormones throughout the entire body, and carries waste away. Data supports a plant-based eating plan like the Pritikin Program for its effectiveness in slowing progression of and reversing heart disease. The video provides a number of recommendations to address heart disease.   Metabolic Syndrome and Belly Fat  Clinical staff conducted group or individual video education with verbal and written material and guidebook.  Patient learns what metabolic syndrome is, how it leads to heart disease, and how one can reverse it and keep it from coming back. You have metabolic syndrome if you have 3 of the following 5 criteria: abdominal obesity,  high blood pressure, high triglycerides, low HDL cholesterol, and high blood sugar.  Hypertension and Heart Disease Clinical staff conducted group or individual video education with verbal and written material and guidebook.  Patient learns that high blood pressure, or hypertension, is very common in the [place] . Hypertension is largely due to excessive salt intake, but other important risk factors include being overweight, physical inactivity, drinking too much alcohol, smoking, and not eating enough potassium from fruits and vegetables. High blood pressure is a leading risk factor for heart attack, stroke, congestive heart failure, dementia, kidney failure, and premature death. Long-term effects of excessive salt intake include stiffening of the arteries and thickening of heart muscle and organ damage. Recommendations include ways to reduce hypertension and the risk of heart disease.  Diseases of Our Time - Focusing on Diabetes Clinical  staff conducted group or individual video education with verbal and written material and guidebook.  Patient learns why the best way to stop diseases of our time is prevention, through food and other lifestyle changes. Medicine (such as prescription pills and surgeries) is often only a Band-Aid on the problem, not a long-term solution. Most common diseases of our time include obesity, type 2 diabetes, hypertension, heart disease, and cancer. The Pritikin Program is recommended and has been proven to help reduce, reverse, and/or prevent the damaging effects of metabolic syndrome.  Nutrition   Overview of the Pritikin Eating Plan  Clinical staff conducted group or individual video education with verbal and written material and guidebook.  Patient learns about the Pritikin Eating Plan for disease risk reduction. The Pritikin Eating Plan emphasizes a wide variety of unrefined, minimally-processed carbohydrates, like fruits, vegetables, whole grains, and legumes. Go,  Caution, and Stop food choices are explained. Plant-based and lean animal proteins are emphasized. Rationale provided for low sodium intake for blood pressure control, low added sugars for blood sugar stabilization, and low added fats and oils for coronary artery disease risk reduction and weight management.  Calorie Density  Clinical staff conducted group or individual video education with verbal and written material and guidebook.  Patient learns about calorie density and how it impacts the Pritikin Eating Plan. Knowing the characteristics of the food you choose will help you decide whether those foods will lead to weight gain or weight loss, and whether you want to consume more or less of them. Weight loss is usually a side effect of the Pritikin Eating Plan because of its focus on low calorie-dense foods.  Label Reading  Clinical staff conducted group or individual video education with verbal and written material and guidebook.  Patient learns about the Pritikin recommended label reading guidelines and corresponding recommendations regarding calorie density, added sugars, sodium content, and whole grains.  Dining Out - Part 1  Clinical staff conducted group or individual video education with verbal and written material and guidebook.  Patient learns that restaurant meals can be sabotaging because they can be so high in calories, fat, sodium, and/or sugar. Patient learns recommended strategies on how to positively address this and avoid unhealthy pitfalls.  Facts on Fats  Clinical staff conducted group or individual video education with verbal and written material and guidebook.  Patient learns that lifestyle modifications can be just as effective, if not more so, as many medications for lowering your risk of heart disease. A Pritikin lifestyle can help to reduce your risk of inflammation and atherosclerosis (cholesterol build-up, or plaque, in the artery walls). Lifestyle interventions such as  dietary choices and physical activity address the cause of atherosclerosis. A review of the types of fats and their impact on blood cholesterol levels, along with dietary recommendations to reduce fat intake is also included.  Nutrition Action Plan  Clinical staff conducted group or individual video education with verbal and written material and guidebook.  Patient learns how to incorporate Pritikin recommendations into their lifestyle. Recommendations include planning and keeping personal health goals in mind as an important part of their success.  Healthy Mind-Set    Healthy Minds, Bodies, Hearts  Clinical staff conducted group or individual video education with verbal and written material and guidebook.  Patient learns how to identify when they are stressed. Video will discuss the impact of that stress, as well as the many benefits of stress management. Patient will also be introduced to stress management techniques. The way  we think, act, and feel has an impact on our hearts.  How Our Thoughts Can Heal Our Hearts  Clinical staff conducted group or individual video education with verbal and written material and guidebook.  Patient learns that negative thoughts can cause depression and anxiety. This can result in negative lifestyle behavior and serious health problems. Cognitive behavioral therapy is an effective method to help control our thoughts in order to change and improve our emotional outlook.  Additional Videos:  Exercise    Improving Performance  Clinical staff conducted group or individual video education with verbal and written material and guidebook.  Patient learns to use a non-linear approach by alternating intensity levels and lengths of time spent exercising to help burn more calories and lose more body fat. Cardiovascular exercise helps improve heart health, metabolism, hormonal balance, blood sugar control, and recovery from fatigue. Resistance training improves strength,  endurance, balance, coordination, reaction time, metabolism, and muscle mass. Flexibility exercise improves circulation, posture, and balance. Seek guidance from your physician and exercise physiologist before implementing an exercise routine and learn your capabilities and proper form for all exercise.  Introduction to Yoga  Clinical staff conducted group or individual video education with verbal and written material and guidebook.  Patient learns about yoga, a discipline of the coming together of mind, breath, and body. The benefits of yoga include improved flexibility, improved range of motion, better posture and core strength, increased lung function, weight loss, and positive self-image. Yoga's heart health benefits include lowered blood pressure, healthier heart rate, decreased cholesterol and triglyceride levels, improved immune function, and reduced stress. Seek guidance from your physician and exercise physiologist before implementing an exercise routine and learn your capabilities and proper form for all exercise.  Medical   Aging: Enhancing Your Quality of Life  Clinical staff conducted group or individual video education with verbal and written material and guidebook.  Patient learns key strategies and recommendations to stay in good physical health and enhance quality of life, such as prevention strategies, having an advocate, securing a Health Care Proxy and Power of Attorney, and keeping a list of medications and system for tracking them. It also discusses how to avoid risk for bone loss.  Biology of Weight Control  Clinical staff conducted group or individual video education with verbal and written material and guidebook.  Patient learns that weight gain occurs because we consume more calories than we burn (eating more, moving less). Even if your body weight is normal, you may have higher ratios of fat compared to muscle mass. Too much body fat puts you at increased risk for  cardiovascular disease, heart attack, stroke, type 2 diabetes, and obesity-related cancers. In addition to exercise, following the Pritikin Eating Plan can help reduce your risk.  Decoding Lab Results  Clinical staff conducted group or individual video education with verbal and written material and guidebook.  Patient learns that lab test reflects one measurement whose values change over time and are influenced by many factors, including medication, stress, sleep, exercise, food, hydration, pre-existing medical conditions, and more. It is recommended to use the knowledge from this video to become more involved with your lab results and evaluate your numbers to speak with your doctor.   Diseases of Our Time - Overview  Clinical staff conducted group or individual video education with verbal and written material and guidebook.  Patient learns that according to the CDC, 50% to 70% of chronic diseases (such as obesity, type 2 diabetes, elevated lipids, hypertension, and heart  disease) are avoidable through lifestyle improvements including healthier food choices, listening to satiety cues, and increased physical activity.  Sleep Disorders Clinical staff conducted group or individual video education with verbal and written material and guidebook.  Patient learns how good quality and duration of sleep are important to overall health and well-being. Patient also learns about sleep disorders and how they impact health along with recommendations to address them, including discussing with a physician.  Nutrition  Dining Out - Part 2 Clinical staff conducted group or individual video education with verbal and written material and guidebook.  Patient learns how to plan ahead and communicate in order to maximize their dining experience in a healthy and nutritious manner. Included are recommended food choices based on the type of restaurant the patient is visiting.   Fueling a Banker  conducted group or individual video education with verbal and written material and guidebook.  There is a strong connection between our food choices and our health. Diseases like obesity and type 2 diabetes are very prevalent and are in large-part due to lifestyle choices. The Pritikin Eating Plan provides plenty of food and hunger-curbing satisfaction. It is easy to follow, affordable, and helps reduce health risks.  Menu Workshop  Clinical staff conducted group or individual video education with verbal and written material and guidebook.  Patient learns that restaurant meals can sabotage health goals because they are often packed with calories, fat, sodium, and sugar. Recommendations include strategies to plan ahead and to communicate with the manager, chef, or server to help order a healthier meal.  Planning Your Eating Strategy  Clinical staff conducted group or individual video education with verbal and written material and guidebook.  Patient learns about the Pritikin Eating Plan and its benefit of reducing the risk of disease. The Pritikin Eating Plan does not focus on calories. Instead, it emphasizes high-quality, nutrient-rich foods. By knowing the characteristics of the foods, we choose, we can determine their calorie density and make informed decisions.  Targeting Your Nutrition Priorities  Clinical staff conducted group or individual video education with verbal and written material and guidebook.  Patient learns that lifestyle habits have a tremendous impact on disease risk and progression. This video provides eating and physical activity recommendations based on your personal health goals, such as reducing LDL cholesterol, losing weight, preventing or controlling type 2 diabetes, and reducing high blood pressure.  Vitamins and Minerals  Clinical staff conducted group or individual video education with verbal and written material and guidebook.  Patient learns different ways to obtain  key vitamins and minerals, including through a recommended healthy diet. It is important to discuss all supplements you take with your doctor.   Healthy Mind-Set    Smoking Cessation  Clinical staff conducted group or individual video education with verbal and written material and guidebook.  Patient learns that cigarette smoking and tobacco addiction pose a serious health risk which affects millions of people. Stopping smoking will significantly reduce the risk of heart disease, lung disease, and many forms of cancer. Recommended strategies for quitting are covered, including working with your doctor to develop a successful plan.  Culinary   Becoming a Set Designer conducted group or individual video education with verbal and written material and guidebook.  Patient learns that cooking at home can be healthy, cost-effective, quick, and puts them in control. Keys to cooking healthy recipes will include looking at your recipe, assessing your equipment needs, planning ahead, making it simple,  choosing cost-effective seasonal ingredients, and limiting the use of added fats, salts, and sugars.  Cooking - Breakfast and Snacks  Clinical staff conducted group or individual video education with verbal and written material and guidebook.  Patient learns how important breakfast is to satiety and nutrition through the entire day. Recommendations include key foods to eat during breakfast to help stabilize blood sugar levels and to prevent overeating at meals later in the day. Planning ahead is also a key component.  Cooking - Educational Psychologist conducted group or individual video education with verbal and written material and guidebook.  Patient learns eating strategies to improve overall health, including an approach to cook more at home. Recommendations include thinking of animal protein as a side on your plate rather than center stage and focusing instead on lower calorie  dense options like vegetables, fruits, whole grains, and plant-based proteins, such as beans. Making sauces in large quantities to freeze for later and leaving the skin on your vegetables are also recommended to maximize your experience.  Cooking - Healthy Salads and Dressing Clinical staff conducted group or individual video education with verbal and written material and guidebook.  Patient learns that vegetables, fruits, whole grains, and legumes are the foundations of the Pritikin Eating Plan. Recommendations include how to incorporate each of these in flavorful and healthy salads, and how to create homemade salad dressings. Proper handling of ingredients is also covered. Cooking - Soups and [employer] - Soups and Desserts Clinical staff conducted group or individual video education with verbal and written material and guidebook.  Patient learns that Pritikin soups and desserts make for easy, nutritious, and delicious snacks and meal components that are low in sodium, fat, sugar, and calorie density, while high in vitamins, minerals, and filling fiber. Recommendations include simple and healthy ideas for soups and desserts.   Overview     The Pritikin Solution Program Overview Clinical staff conducted group or individual video education with verbal and written material and guidebook.  Patient learns that the results of the Pritikin Program have been documented in more than 100 articles published in peer-reviewed journals, and the benefits include reducing risk factors for (and, in some cases, even reversing) high cholesterol, high blood pressure, type 2 diabetes, obesity, and more! An overview of the three key pillars of the Pritikin Program will be covered: eating well, doing regular exercise, and having a healthy mind-set.  WORKSHOPS  Exercise: Exercise Basics: Building Your Action Plan Clinical staff led group instruction and group discussion with PowerPoint presentation and patient  guidebook. To enhance the learning environment the use of posters, models and videos may be added. At the conclusion of this workshop, patients will comprehend the difference between physical activity and exercise, as well as the benefits of incorporating both, into their routine. Patients will understand the FITT (Frequency, Intensity, Time, and Type) principle and how to use it to build an exercise action plan. In addition, safety concerns and other considerations for exercise and cardiac rehab will be addressed by the presenter. The purpose of this lesson is to promote a comprehensive and effective weekly exercise routine in order to improve patients' overall level of fitness.   Managing Heart Disease: Your Path to a Healthier Heart Clinical staff led group instruction and group discussion with PowerPoint presentation and patient guidebook. To enhance the learning environment the use of posters, models and videos may be added.At the conclusion of this workshop, patients will understand the anatomy and physiology  of the heart. Additionally, they will understand how Pritikin's three pillars impact the risk factors, the progression, and the management of heart disease.  The purpose of this lesson is to provide a high-level overview of the heart, heart disease, and how the Pritikin lifestyle positively impacts risk factors.  Exercise Biomechanics Clinical staff led group instruction and group discussion with PowerPoint presentation and patient guidebook. To enhance the learning environment the use of posters, models and videos may be added. Patients will learn how the structural parts of their bodies function and how these functions impact their daily activities, movement, and exercise. Patients will learn how to promote a neutral spine, learn how to manage pain, and identify ways to improve their physical movement in order to promote healthy living. The purpose of this lesson is to expose patients  to common physical limitations that impact physical activity. Participants will learn practical ways to adapt and manage aches and pains, and to minimize their effect on regular exercise. Patients will learn how to maintain good posture while sitting, walking, and lifting.  Balance Training and Fall Prevention  Clinical staff led group instruction and group discussion with PowerPoint presentation and patient guidebook. To enhance the learning environment the use of posters, models and videos may be added. At the conclusion of this workshop, patients will understand the importance of their sensorimotor skills (vision, proprioception, and the vestibular system) in maintaining their ability to balance as they age. Patients will apply a variety of balancing exercises that are appropriate for their current level of function. Patients will understand the common causes for poor balance, possible solutions to these problems, and ways to modify their physical environment in order to minimize their fall risk. The purpose of this lesson is to teach patients about the importance of maintaining balance as they age and ways to minimize their risk of falling.  WORKSHOPS   Nutrition:  Fueling a Ship Broker led group instruction and group discussion with PowerPoint presentation and patient guidebook. To enhance the learning environment the use of posters, models and videos may be added. Patients will review the foundational principles of the Pritikin Eating Plan and understand what constitutes a serving size in each of the food groups. Patients will also learn Pritikin-friendly foods that are better choices when away from home and review make-ahead meal and snack options. Calorie density will be reviewed and applied to three nutrition priorities: weight maintenance, weight loss, and weight gain. The purpose of this lesson is to reinforce (in a group setting) the key concepts around what patients are  recommended to eat and how to apply these guidelines when away from home by planning and selecting Pritikin-friendly options. Patients will understand how calorie density may be adjusted for different weight management goals.  Mindful Eating  Clinical staff led group instruction and group discussion with PowerPoint presentation and patient guidebook. To enhance the learning environment the use of posters, models and videos may be added. Patients will briefly review the concepts of the Pritikin Eating Plan and the importance of low-calorie dense foods. The concept of mindful eating will be introduced as well as the importance of paying attention to internal hunger signals. Triggers for non-hunger eating and techniques for dealing with triggers will be explored. The purpose of this lesson is to provide patients with the opportunity to review the basic principles of the Pritikin Eating Plan, discuss the value of eating mindfully and how to measure internal cues of hunger and fullness using the Hunger Scale.  Patients will also discuss reasons for non-hunger eating and learn strategies to use for controlling emotional eating.  Targeting Your Nutrition Priorities Clinical staff led group instruction and group discussion with PowerPoint presentation and patient guidebook. To enhance the learning environment the use of posters, models and videos may be added. Patients will learn how to determine their genetic susceptibility to disease by reviewing their family history. Patients will gain insight into the importance of diet as part of an overall healthy lifestyle in mitigating the impact of genetics and other environmental insults. The purpose of this lesson is to provide patients with the opportunity to assess their personal nutrition priorities by looking at their family history, their own health history and current risk factors. Patients will also be able to discuss ways of prioritizing and modifying the Pritikin  Eating Plan for their highest risk areas  Menu  Clinical staff led group instruction and group discussion with PowerPoint presentation and patient guidebook. To enhance the learning environment the use of posters, models and videos may be added. Using menus brought in from [employer], or printed from [employer], patients will apply the Pritikin dining out guidelines that were presented in the [employer] video. Patients will also be able to practice these guidelines in a variety of provided scenarios. The purpose of this lesson is to provide patients with the opportunity to practice hands-on learning of the Pritikin Dining Out guidelines with actual menus and practice scenarios.  Label Reading Clinical staff led group instruction and group discussion with PowerPoint presentation and patient guidebook. To enhance the learning environment the use of posters, models and videos may be added. Patients will review and discuss the Pritikin label reading guidelines presented in Pritikin's Label Reading Educational series video. Using fool labels brought in from local grocery stores and markets, patients will apply the label reading guidelines and determine if the packaged food meet the Pritikin guidelines. The purpose of this lesson is to provide patients with the opportunity to review, discuss, and practice hands-on learning of the Pritikin Label Reading guidelines with actual packaged food labels. Cooking School  Pritikin's [employer] are designed to teach patients ways to prepare quick, simple, and affordable recipes at home. The importance of nutrition's role in chronic disease risk reduction is reflected in its emphasis in the overall Pritikin program. By learning how to prepare essential core Pritikin Eating Plan recipes, patients will increase control over what they eat; be able to customize the flavor of foods without the use of added salt, sugar, or fat; and  improve the quality of the food they consume. By learning a set of core recipes which are easily assembled, quickly prepared, and affordable, patients are more likely to prepare more healthy foods at home. These workshops focus on convenient breakfasts, simple entres, side dishes, and desserts which can be prepared with minimal effort and are consistent with nutrition recommendations for cardiovascular risk reduction. Cooking [employer] are taught by a armed forces logistics/support/administrative officer (RD) who has been trained by the [employer]. The chef or RD has a clear understanding of the importance of minimizing - if not completely eliminating - added fat, sugar, and sodium in recipes. Throughout the series of Cooking School Workshop sessions, patients will learn about healthy ingredients and efficient methods of cooking to build confidence in their capability to prepare    Cooking School weekly topics:  Adding Flavor- Sodium-Free  Fast and Healthy Breakfasts  Powerhouse Plant-Based Proteins  Satisfying  Salads and Dressings  Simple Sides and Sauces  International Cuisine-Spotlight on the [employer] Zones  Delicious Desserts  Savory Soups  [employer] - Meals in a Snap  Tasty Appetizers and Snacks  Comforting Weekend Breakfasts  One-Pot Wonders   Fast Evening Meals  Landscape Architect Your Pritikin Plate  WORKSHOPS   Healthy Mindset (Psychosocial):  Focused Goals, Sustainable Changes Clinical staff led group instruction and group discussion with PowerPoint presentation and patient guidebook. To enhance the learning environment the use of posters, models and videos may be added. Patients will be able to apply effective goal setting strategies to establish at least one personal goal, and then take consistent, meaningful action toward that goal. They will learn to identify common barriers to achieving personal goals and develop strategies to overcome them. Patients will  also gain an understanding of how our mind-set can impact our ability to achieve goals and the importance of cultivating a positive and growth-oriented mind-set. The purpose of this lesson is to provide patients with a deeper understanding of how to set and achieve personal goals, as well as the tools and strategies needed to overcome common obstacles which may arise along the way.  From Head to Heart: The Power of a Healthy Outlook  Clinical staff led group instruction and group discussion with PowerPoint presentation and patient guidebook. To enhance the learning environment the use of posters, models and videos may be added. Patients will be able to recognize and describe the impact of emotions and mood on physical health. They will discover the importance of self-care and explore self-care practices which may work for them. Patients will also learn how to utilize the 4 C's to cultivate a healthier outlook and better manage stress and challenges. The purpose of this lesson is to demonstrate to patients how a healthy outlook is an essential part of maintaining good health, especially as they continue their cardiac rehab journey.  Healthy Sleep for a Healthy Heart Clinical staff led group instruction and group discussion with PowerPoint presentation and patient guidebook. To enhance the learning environment the use of posters, models and videos may be added. At the conclusion of this workshop, patients will be able to demonstrate knowledge of the importance of sleep to overall health, well-being, and quality of life. They will understand the symptoms of, and treatments for, common sleep disorders. Patients will also be able to identify daytime and nighttime behaviors which impact sleep, and they will be able to apply these tools to help manage sleep-related challenges. The purpose of this lesson is to provide patients with a general overview of sleep and outline the importance of quality sleep. Patients will  learn about a few of the most common sleep disorders. Patients will also be introduced to the concept of "sleep hygiene," and discover ways to self-manage certain sleeping problems through simple daily behavior changes. Finally, the workshop will motivate patients by clarifying the links between quality sleep and their goals of heart-healthy living.   Recognizing and Reducing Stress Clinical staff led group instruction and group discussion with PowerPoint presentation and patient guidebook. To enhance the learning environment the use of posters, models and videos may be added. At the conclusion of this workshop, patients will be able to understand the types of stress reactions, differentiate between acute and chronic stress, and recognize the impact that chronic stress has on their health. They will also be able to apply different coping mechanisms, such as reframing negative self-talk. Patients will have the opportunity to  practice a variety of stress management techniques, such as deep abdominal breathing, progressive muscle relaxation, and/or guided imagery.  The purpose of this lesson is to educate patients on the role of stress in their lives and to provide healthy techniques for coping with it.  Learning Barriers/Preferences:   Education Topics:  Knowledge Questionnaire Score:   Core Components/Risk Factors/Patient Goals at Admission:   Core Components/Risk Factors/Patient Goals Review:   Goals and Risk Factor Review     Row Name 01/31/24 1414 02/23/24 1652 03/24/24 1438         Core Components/Risk Factors/Patient Goals Review   Personal Goals Review Weight Management/Obesity;Diabetes;Heart Failure;Hypertension;Lipids;Stress Weight Management/Obesity;Diabetes;Heart Failure;Hypertension;Lipids;Stress Weight Management/Obesity;Diabetes;Heart Failure;Hypertension;Lipids;Stress     Review Angie is doing well with exercise at cardiac rehab. Vital signs have been stable. Angie has increased  her met levels. Mercy is doing okay with exercise at cardiac rehab, but her progress is hindered by chronic left knee pain. VSS. Angie has maintained her met levels for the last 30 days. Mercy is doing okay with exercise at cardiac rehab, but her progress is hindered by chronic left knee pain. VSS. Angie has continued to maintain her met levels for the last 30 days.     Expected Outcomes Angie will continue to participate in cardiac rehab for exercise, nutrtion and lifestyle modifications. Angie will continue to participate in cardiac rehab for exercise, nutrtion and lifestyle modifications. Angie will continue to participate in cardiac rehab for exercise, nutrition and lifestyle modifications.        Core Components/Risk Factors/Patient Goals at Discharge (Final Review):   Goals and Risk Factor Review - 03/24/24 1438       Core Components/Risk Factors/Patient Goals Review   Personal Goals Review Weight Management/Obesity;Diabetes;Heart Failure;Hypertension;Lipids;Stress    Review Mercy is doing okay with exercise at cardiac rehab, but her progress is hindered by chronic left knee pain. VSS. Angie has continued to maintain her met levels for the last 30 days.    Expected Outcomes Angie will continue to participate in cardiac rehab for exercise, nutrition and lifestyle modifications.          ITP Comments:  ITP Comments     Row Name 01/31/24 1152 02/23/24 1650 03/24/24 1436       ITP Comments 30 Day ITP Review. Angie started cardiac rehab on 01/10/24 and has done well with exercise when in attendance 30 Day ITP Review. Angie's progress with exercise at cardiac rehab is somewhat hindered by chronic left knee pain. 30 Day ITP Review. Angie's progress with exercise at cardiac rehab continues to be somewhat hindered by chronic left knee pain.        Comments: see ITP comments

## 2024-03-29 ENCOUNTER — Encounter (HOSPITAL_COMMUNITY): Admission: RE | Admit: 2024-03-29 | Source: Ambulatory Visit

## 2024-03-29 ENCOUNTER — Telehealth (HOSPITAL_COMMUNITY): Payer: Self-pay

## 2024-03-29 NOTE — Telephone Encounter (Signed)
 Patient c/o sick for 12:30 CR class.

## 2024-03-29 NOTE — Progress Notes (Signed)
 Remote ICD Transmission

## 2024-03-30 ENCOUNTER — Ambulatory Visit: Payer: Self-pay | Admitting: Internal Medicine

## 2024-03-31 ENCOUNTER — Encounter (HOSPITAL_COMMUNITY): Admission: RE | Admit: 2024-03-31 | Source: Ambulatory Visit

## 2024-03-31 NOTE — Telephone Encounter (Signed)
 Discussed with Dr. Waddell:  Per his recommendation at present:   Historically the solution to these problems has been worse than the problem. I would keep her outputs at maximum, do the polarity as they recommend, and watch her impedences. At some point we may need to do something but for now I would be loath to extract this lead.   Patient is programmed appropriately per recommendations above.

## 2024-04-03 ENCOUNTER — Encounter (HOSPITAL_COMMUNITY)
Admission: RE | Admit: 2024-04-03 | Discharge: 2024-04-03 | Disposition: A | Discharge status: Home / Self Care | Source: Ambulatory Visit | Attending: Internal Medicine | Admitting: Internal Medicine

## 2024-04-03 DIAGNOSIS — I5042 Chronic combined systolic (congestive) and diastolic (congestive) heart failure: Secondary | ICD-10-CM | POA: Insufficient documentation

## 2024-04-05 ENCOUNTER — Ambulatory Visit (HOSPITAL_COMMUNITY): Payer: Self-pay | Admitting: Family Medicine

## 2024-04-05 ENCOUNTER — Ambulatory Visit (HOSPITAL_COMMUNITY)
Admission: RE | Admit: 2024-04-05 | Discharge: 2024-04-05 | Disposition: A | Discharge status: Home / Self Care | Source: Ambulatory Visit | Attending: Internal Medicine | Admitting: Internal Medicine

## 2024-04-05 ENCOUNTER — Encounter (INDEPENDENT_AMBULATORY_CARE_PROVIDER_SITE_OTHER): Payer: Self-pay | Admitting: Internal Medicine

## 2024-04-05 ENCOUNTER — Encounter (HOSPITAL_COMMUNITY)
Admission: RE | Admit: 2024-04-05 | Discharge: 2024-04-05 | Disposition: A | Discharge status: Home / Self Care | Source: Ambulatory Visit | Attending: Internal Medicine | Admitting: Internal Medicine

## 2024-04-05 DIAGNOSIS — I5022 Chronic systolic (congestive) heart failure: Secondary | ICD-10-CM | POA: Insufficient documentation

## 2024-04-05 DIAGNOSIS — I5042 Chronic combined systolic (congestive) and diastolic (congestive) heart failure: Secondary | ICD-10-CM | POA: Diagnosis not present

## 2024-04-05 DIAGNOSIS — I10 Essential (primary) hypertension: Secondary | ICD-10-CM | POA: Diagnosis not present

## 2024-04-05 LAB — BASIC METABOLIC PANEL WITH GFR
Anion gap: 16 — ABNORMAL HIGH (ref 5–15)
BUN: 12 mg/dL (ref 6–20)
CO2: 29 mmol/L (ref 22–32)
Calcium: 9.7 mg/dL (ref 8.9–10.3)
Chloride: 92 mmol/L — ABNORMAL LOW (ref 98–111)
Creatinine, Ser: 1.12 mg/dL — ABNORMAL HIGH (ref 0.44–1.00)
GFR, Estimated: 60 mL/min — ABNORMAL LOW (ref >60)
Glucose, Bld: 119 mg/dL — ABNORMAL HIGH (ref 70–99)
Potassium: 3.6 mmol/L (ref 3.5–5.1)
Sodium: 137 mmol/L (ref 135–145)

## 2024-04-05 NOTE — Progress Notes (Signed)
  PSYCHOSOCIAL ASSESSMENT   Patient psychosocial assessment reveals patient shows fair coping skills with ambivalent outlook.   Anxiety and depression barriers to rehab participation identified, pt stated I feel sad all the time and like I'm putting on a front almost everyday and it gets tiring. Pt denies SI/HI and has no plan to hurt herself/others at this time. Offered emotional support and reassurance. Agreeable for ambulatory referral to [facility] and verbalizes understanding that this is a separate cost from Cardiac/Pulmonary rehab and there may be a financial obligation depending upon patient's insurance plan coverage.  PHQ9 -9  Quality of life Score -  Overall 19.07 Health and Function 17.93 Socioeconomic 18.93 Physiological and Spiritual 14.71 Family 28.8

## 2024-04-06 DIAGNOSIS — I10 Essential (primary) hypertension: Secondary | ICD-10-CM | POA: Diagnosis not present

## 2024-04-07 ENCOUNTER — Encounter (HOSPITAL_COMMUNITY): Admission: RE | Admit: 2024-04-07 | Source: Ambulatory Visit

## 2024-04-10 ENCOUNTER — Ambulatory Visit (INDEPENDENT_AMBULATORY_CARE_PROVIDER_SITE_OTHER): Admitting: Internal Medicine

## 2024-04-10 ENCOUNTER — Telehealth (INDEPENDENT_AMBULATORY_CARE_PROVIDER_SITE_OTHER): Payer: Self-pay | Admitting: Internal Medicine

## 2024-04-10 ENCOUNTER — Encounter (HOSPITAL_COMMUNITY)
Admission: RE | Admit: 2024-04-10 | Discharge: 2024-04-10 | Disposition: A | Source: Ambulatory Visit | Attending: Internal Medicine

## 2024-04-10 ENCOUNTER — Encounter (INDEPENDENT_AMBULATORY_CARE_PROVIDER_SITE_OTHER): Payer: Self-pay | Admitting: Internal Medicine

## 2024-04-10 ENCOUNTER — Ambulatory Visit: Payer: Self-pay

## 2024-04-10 VITALS — BP 112/78 | HR 75 | Ht 65.0 in | Wt 201.0 lb

## 2024-04-10 DIAGNOSIS — E66812 Obesity, class 2: Secondary | ICD-10-CM | POA: Diagnosis not present

## 2024-04-10 DIAGNOSIS — I5042 Chronic combined systolic (congestive) and diastolic (congestive) heart failure: Secondary | ICD-10-CM

## 2024-04-10 DIAGNOSIS — E11A Type 2 diabetes mellitus without complications in remission: Secondary | ICD-10-CM

## 2024-04-10 DIAGNOSIS — Z9884 Bariatric surgery status: Secondary | ICD-10-CM

## 2024-04-10 DIAGNOSIS — I10 Essential (primary) hypertension: Secondary | ICD-10-CM | POA: Diagnosis not present

## 2024-04-10 DIAGNOSIS — G4733 Obstructive sleep apnea (adult) (pediatric): Secondary | ICD-10-CM | POA: Diagnosis not present

## 2024-04-10 DIAGNOSIS — Z6835 Body mass index (BMI) 35.0-35.9, adult: Secondary | ICD-10-CM

## 2024-04-10 DIAGNOSIS — E669 Obesity, unspecified: Secondary | ICD-10-CM

## 2024-04-10 MED ORDER — BD PEN NEEDLE MINI U/F 31G X 5 MM MISC: 1.0000 | Freq: Every day | 0 refills | Status: AC

## 2024-04-10 MED ORDER — LIRAGLUTIDE -WEIGHT MANAGEMENT 18 MG/3ML ~~LOC~~ SOPN: PEN_INJECTOR | SUBCUTANEOUS | 0 refills | Status: AC

## 2024-04-10 NOTE — Assessment & Plan Note (Signed)
 Obesity management post-gastric sleeve surgery with challenges in maintaining adequate nutrition and caloric intake. Reports early satiety and nausea with healthy meals. Previous weight loss was achieved with Victoza , but insurance no longer covers it. Discussed potential benefits and risks of retrying Victoza , considering current metabolic state post-surgery. Emphasized the importance of increasing caloric intake and physical activity to enhance metabolism. - Initiated a one-month trial of Victoza , starting at 0.6 mg, increasing to 1.2 mg after one week, and then to 1.8 mg. - Advised increasing caloric intake to 1000-1100 calories per day. - Encouraged physical activity, aiming for 240 minutes per week and increasing daily steps to 5000-7000. - Recommended three small meals with protein and protein snacks if hungry. - Sent prescription for needles to pharmacy. - Scheduled follow-up in four weeks to assess weight loss and medication efficacy.

## 2024-04-10 NOTE — Assessment & Plan Note (Signed)
 Blood pressure  is on the low normal side.  On metoprolol , spironolactone , loop diuretic, Jardiance  and Entresto  without adverse effects.  Most recent renal parameters reviewed which showed stable electrolytes and kidney function.  Monitor for orthostasis as she starts GLP-1 treatment.  Continue current regimen as per cardiology.

## 2024-04-10 NOTE — Assessment & Plan Note (Signed)
 Diagnosed with mild obstructive sleep apnea in early July 2025.  Continue current weight management strategy

## 2024-04-10 NOTE — Telephone Encounter (Signed)
 FYI Only or Action Required?: Action required by provider: update on patient condition. PLEASE CALL IF CANCELLATION TOMORROW 11/18  Patient was last seen in primary care on 04/10/2024 by Francyne Romano, MD.  Called Nurse Triage reporting Anxiety.  Symptoms began about a month ago.  Interventions attempted: Rest, hydration, or home remedies.  Symptoms are: gradually worsening .  Triage Disposition: No disposition on file.  Patient/caregiver understands and will follow disposition?:   Copied from CRM [id number]. Topic: Clinical - Red Word Triage >> Apr 10, 2024  3:44 PM [name] wrote: Red Word that prompted transfer to Nurse Triage: anxiety & depression, seeking appt for medication Reason for Disposition  MODERATE anxiety (e.g., persistent or frequent anxiety symptoms; interferes with sleep, school, or work)  Answer Assessment - Initial Assessment Questions Pt with worsening anxiety and PTSD over the last month. She has a BP med change and had major side effects that presented at a Heart Walk she was supposed to participate in. She lives in fear of something happening when seh leaves the house. She worries about letting friends and family down. Cries all the time, hard to sleep. Just wants to be left alone so she isnt a burden on her family. Denies SI/HI. Has BH support #s and is being set up with therapist but doesn't see them until December. Needs help.  Chest tightness with anxiety- has cardiac history and this is only with her anxiety. ED/UC/Call back instructions given. Appt 11/19 and waitlisted   1. CONCERN: Did anything happen that prompted you to call today?      Anxiety  2. ANXIETY SYMPTOMS: Can you describe how you (your loved one; patient) have been feeling? (e.g., tense, restless, panicky, anxious, keyed up, overwhelmed, sense of impending doom).      Overwhelmed- wants to scream or cry but can't. Gets real nervous like something bad will happen. Has to fake it  throughout every  3. ONSET: How long have you been feeling this way? (e.g., hours, days, weeks)     Worse over the last month- maybe BP med change dehydrated and sick- had to get picked up by EMS at her heart walk 4. SEVERITY: How would you rate the level of anxiety? (e.g., 0 - 10; or mild, moderate, severe).     Moderate to severe 5. FUNCTIONAL IMPAIRMENT: How have these feelings affected your ability to do daily activities? Have you had more difficulty than usual doing your normal daily activities? (e.g., getting better, same, worse; self-care, school, work, interactions)     Trouble sleeping, hard to leave the house 6. HISTORY: Have you felt this way before? Have you ever been diagnosed with an anxiety problem in the past? (e.g., generalized anxiety disorder, panic attacks, PTSD). If Yes, ask: How was this problem treated? (e.g., medicines, counseling, etc.)     Has been on meds and therapy before for this  7. RISK OF HARM - SUICIDAL IDEATION: Do you ever have thoughts of hurting or killing yourself? If Yes, ask:  Do you have these feelings now? Do you have a plan on how you would do this?     denies 8. TREATMENT:  What has been done so far to treat this anxiety? (e.g., medicines, relaxation strategies). What has helped?     Knit and crochet- time by herself 9. THERAPIST: Do you have a counselor or therapist? If Yes, ask: What is their name?     Just got one but doesn't see until  10. POTENTIAL TRIGGERS: Do  you drink caffeinated beverages (e.g., coffee, colas, teas), and how much daily? Do you drink alcohol or use any drugs? Have you started any new medicines recently?       Driving/riding with others, hard to go out in public 11. PATIENT SUPPORT: Who is with you now? Who do you live with? Do you have family or friends who you can talk to?        Husband and daughter 28. OTHER SYMPTOMS: Do you have any other symptoms? (e.g., feeling depressed, trouble  concentrating, trouble sleeping, trouble breathing, palpitations or fast heartbeat, chest pain, sweating, nausea, or diarrhea)       Chest tightness  Protocols used: Anxiety and Panic Attack-A-AH

## 2024-04-10 NOTE — Assessment & Plan Note (Signed)
 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  She is agreeable to trying liraglutide  for pharmacal prevention which is now available generic.

## 2024-04-10 NOTE — Progress Notes (Signed)
 Office: [phone]  /  Fax: [phone]  Weight Summary and Body Composition Analysis (BIA)  Vitals BP: 112/78 Pulse Rate: 75 SpO2: 100 %   Anthropometric Measurements Height: 5' 5 (1.651 m) Weight: 201 lb (91.2 kg) BMI (Calculated): 33.45 Weight at Last Visit: 202 lb Weight Lost Since Last Visit: 1 lb Weight Gained Since Last Visit: 0 lb Starting Weight: 211 lb Total Weight Loss (lbs): 10 lb (4.536 kg)   Body Composition  Body Fat %: 41.4 % Fat Mass (lbs): 83.4 lbs Muscle Mass (lbs): 112 lbs Total Body Water (lbs): 72 lbs Visceral Fat Rating : 10    RMR: 1570  Today's Visit #: 5  Starting Date: 01/03/24   Subjective   Chief Complaint: Obesity  Interval History Discussed the use of AI scribe software for clinical note transcription with the patient, who gave verbal consent to proceed.  History of Present Illness [name] is a 48 year old female with hypertension, chronic heart failure, and sleep apnea who presents for medical weight management.  She faces challenges with her nutrition and exercise regimen, adhering to dietary guidelines about 50% of the time. 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 She experiences a significant decrease in appetite, often feeling full after a few bites, which she attributes to her gastric sleeve surgery. She attempts to eat five small meals daily but struggles to consume enough calories, averaging around 800 calories per day. Occasionally, she consumes over 1000 calories, but this is infrequent. She diligently tracks her intake.  Her physical activity has decreased recently, with less frequent visits to the [facility]. Previously, she engaged in swimming, contributing to her weight loss. She recalls losing weight after her gastric sleeve surgery in 2019 and notes recent weight fluctuations, with a downward trend from 202 to 201  pounds.  She was previously on [name]  before her gastric sleeve surgery, losing 20 to 30 pounds during that time. She discontinued [name]  due to insurance coverage issues. Her diabetes is currently in remission, and she is on [name] , which she believes contributes to her low A1c levels. Her sleep apnea has also improved.  Her current medications include [name]  for heart failure and diabetes management.     Challenges affecting patient progress: inadequate response to nutritional and behavioral strategies and metabolic adaptations associated with metabolic surgery.    Pharmacotherapy for weight management: She is currently taking no anti-obesity medication.   Assessment and Plan   Treatment Plan For Obesity:  Recommended Dietary Goals  [name] is currently in the action stage of change. As such, her goal is to continue weight management plan. She has agreed to: incorporate prepackaged healthy meals for convenience, incorporate 1-2 meal replacements a day for convenience , and continue current plan  Behavioral Health and Counseling  We discussed the following behavioral modification strategies today: continue to work on maintaining a reduced calorie state, getting the recommended amount of protein, incorporating whole foods, making healthy choices, staying well hydrated and practicing mindfulness when eating. and increase protein intake, fibrous foods (25 grams per day for women, 30 grams for men) and water to improve satiety and decrease hunger signals. .  Additional education and resources provided today: Handout on traveling and holiday eating strategies  Recommended Physical Activity Goals  [name] has been advised to work up to 150 minutes of moderate intensity aerobic activity a week and strengthening exercises 2-3 times per week for cardiovascular health, weight loss maintenance  and preservation of muscle mass.  She has agreed to :  Increase volume of physical activity to  a goal of 240 minutes a week and Combine aerobic and strengthening exercises for efficiency and improved cardiometabolic health.  Medical Interventions and Pharmacotherapy  We discussed various medication options to help [name] with her weight loss efforts and we both agreed to : Start anti-obesity medication.  In addition to reduced calorie nutrition plan (RCNP), behavioral strategies and physical activity, [name] would benefit from pharmacotherapy to assist with hunger signals, satiety and cravings. This will reduce obesity-related health risks by inducing weight loss, and help reduce food consumption and adherence to [facility]) . It may also improve QOL by improving self-confidence and reduce the  setbacks associated with metabolic adaptations.  After discussion of treatment options, mechanisms of action, benefits, side effects, contraindications and shared decision making she is agreeable to starting liraglutide  1.8 mg once daily. Patient also made aware that medication is indicated for long-term management of obesity and the risk of weight regain following discontinuation of treatment and hence the importance of adhering to medical weight loss plan.   Associated Conditions Impacted by Obesity Treatment  Assessment & Plan Class 2 severe obesity with serious comorbidity and body mass index (BMI) of 35.0 to 35.9 in adult, unspecified obesity type S/P laparoscopic sleeve gastrectomy Obesity management post-gastric sleeve surgery with challenges in maintaining adequate nutrition and caloric intake. Reports early satiety and nausea with healthy meals. Previous weight loss was achieved with [name] , but insurance no longer covers it. Discussed potential benefits and risks of retrying [name] , considering current metabolic state post-surgery. Emphasized the importance of increasing caloric intake and physical activity to enhance metabolism. - Initiated a one-month trial of [name] , starting at 0.6 mg,  increasing to 1.2 mg after one week, and then to 1.8 mg. - Advised increasing caloric intake to 1000-1100 calories per day. - Encouraged physical activity, aiming for 240 minutes per week and increasing daily steps to 5000-7000. - Recommended three small meals with protein and protein snacks if hungry. - Sent prescription for needles to pharmacy. - Scheduled follow-up in four weeks to assess weight loss and medication efficacy. OSA (obstructive sleep apnea) Diagnosed with mild obstructive sleep apnea in early July 2025.  Continue current weight management strategy Essential hypertension Blood pressure  is on the low normal side.  On metoprolol , spironolactone , loop diuretic, [name]  and Entresto  without adverse effects.  Most recent renal parameters reviewed which showed stable electrolytes and kidney function.  Monitor for orthostasis as she starts GLP-1 treatment.  Continue current regimen as per cardiology. Type 2 diabetes mellitus in patient with obesity (HCC) [name] diabetes is in remission following gastric sleeve surgery she was still benefit from GLP-1 treatment but this is not covered by her insurance.  She is agreeable to trying liraglutide  for pharmacal prevention which is now available generic.          Objective   Physical Exam:  Blood pressure 112/78, pulse 75, height 5' 5 (1.651 m), weight 201 lb (91.2 kg), SpO2 100%. Body mass index is 33.45 kg/m.  General: She is overweight, cooperative, alert, well developed, and in no acute distress. PSYCH: Has normal mood, affect and thought process.   HEENT: EOMI, sclerae are anicteric. Lungs: Normal breathing effort, no conversational dyspnea. Extremities: No edema.  Neurologic: No gross sensory or motor deficits. No tremors or fasciculations noted.    Diagnostic Data Reviewed:  BMET    Component Value Date/Time   NA 137 04/05/2024  1457   NA 138 07/23/2023 1322   K 3.6 04/05/2024 1457   CL 92 (L) 04/05/2024 1457    CO2 29 04/05/2024 1457   GLUCOSE 119 (H) 04/05/2024 1457   BUN 12 04/05/2024 1457   BUN 22 07/23/2023 1322   CREATININE 1.12 (H) 04/05/2024 1457   CREATININE 0.90 03/26/2016 1536   CALCIUM 9.7 04/05/2024 1457   GFRNONAA 60 (L) 04/05/2024 1457   GFRNONAA >89 01/18/2014 1621   GFRAA >60 02/15/2020 1845   GFRAA >89 01/18/2014 1621   Lab Results  Component Value Date   HGBA1C 5.2 07/16/2023   HGBA1C 5.4 11/09/2015   Lab Results  Component Value Date   INSULIN  11.8 01/03/2024   Lab Results  Component Value Date   TSH 1.750 07/23/2023   CBC    Component Value Date/Time   WBC 9.7 02/26/2024 1233   RBC 5.19 (H) 02/26/2024 1233   HGB 15.0 02/26/2024 1233   HGB 10.6 (L) 09/07/2019 1159   HCT 46.9 (H) 02/26/2024 1233   HCT 32.6 (L) 08/06/2020 1517   PLT 289 02/26/2024 1233   PLT 278 09/07/2019 1159   MCV 90.4 02/26/2024 1233   MCV 82 09/07/2019 1159   MCH 28.9 02/26/2024 1233   MCHC 32.0 02/26/2024 1233   RDW 14.1 02/26/2024 1233   RDW 14.7 09/07/2019 1159   Iron  Studies    Component Value Date/Time   IRON  44 02/23/2024 1600   TIBC 372 02/23/2024 1600   FERRITIN 68 02/23/2024 1600   IRONPCTSAT 12 02/23/2024 1600   Lipid Panel     Component Value Date/Time   CHOL 164 06/11/2023 1107   TRIG 66.0 06/11/2023 1107   HDL 67.00 06/11/2023 1107   CHOLHDL 2 06/11/2023 1107   VLDL 13.2 06/11/2023 1107   LDLCALC 83 06/11/2023 1107   Hepatic Function Panel     Component Value Date/Time   PROT 6.7 01/21/2023 1523   PROT 7.3 05/15/2021 1032   ALBUMIN  3.8 01/21/2023 1523   ALBUMIN  4.5 05/15/2021 1032   AST 38 01/21/2023 1523   ALT 30 01/21/2023 1523   ALKPHOS 64 01/21/2023 1523   BILITOT 1.1 01/21/2023 1523   BILITOT 0.3 05/15/2021 1032   BILIDIR <0.1 08/26/2020 0201   IBILI 0.6 11/21/2017 2155      Component Value Date/Time   TSH 1.750 07/23/2023 1321   Nutritional Lab Results  Component Value Date   VD25OH 24.1 (L) 01/03/2024   VD25OH 14.88 (L)  06/11/2023   VD25OH 12.60 (L) 07/29/2020    Medications: Outpatient Encounter Medications as of 04/10/2024  Medication Sig Note   Insulin  Pen Needle (B-D UF III MINI PEN NEEDLES) 31G X 5 MM MISC 1 Needle by Does not apply route daily.    Liraglutide  -Weight Management 18 MG/3ML SOPN Inject 0.6 mg into the skin daily for 7 days, THEN 1.2 mg daily for 7 days, THEN 1.8 mg daily for 14 days.    albuterol  (VENTOLIN  HFA) 108 (90 Base) MCG/ACT inhaler Inhale 1-2 puffs into the lungs every 6 (six) hours as needed for wheezing or shortness of breath.    diclofenac  (VOLTAREN ) 75 MG EC tablet Take 1 tablet by mouth twice daily as needed    empagliflozin  ([name] ) 10 MG TABS tablet Take 1 tablet (10 mg total) by mouth daily before breakfast.    ipratropium-albuterol  (DUONEB) 0.5-2.5 (3) MG/3ML SOLN USE 1 AMPULE IN NEBULIZER EVERY 6 HOURS AS NEEDED    ivabradine  (CORLANOR ) 7.5 MG TABS tablet Take 1  tablet (7.5 mg total) by mouth 2 (two) times daily with a meal.    losartan  (COZAAR ) 25 MG tablet Take 0.5 tablets (12.5 mg total) by mouth at bedtime.    metoprolol  succinate (TOPROL  XL) 25 MG 24 hr tablet Take 0.5 tablets (12.5 mg total) by mouth daily.    nitroGLYCERIN  (NITROSTAT ) 0.4 MG SL tablet Place 1 tablet (0.4 mg total) under the tongue every 5 (five) minutes as needed for chest pain.    OVER THE COUNTER MEDICATION Take 1 capsule by mouth daily. BARIATRIC VITAMIN    spironolactone  (ALDACTONE ) 25 MG tablet Take 1 tablet (25 mg total) by mouth daily.    tizanidine  (ZANAFLEX ) 2 MG capsule Take 2 mg by mouth as needed for muscle spasms.    torsemide  (DEMADEX ) 20 MG tablet Take 1 tablet (20 mg total) by mouth daily as needed (for weight gain and swelling). (Patient taking differently: Take 20 mg by mouth every other day.)    Ubrogepant  (UBRELVY ) 100 MG TABS Take 1 tablet (100 mg total) by mouth daily as needed (migraine).    Vitamin D , Ergocalciferol , (DRISDOL ) 1.25 MG (50000 UNIT) CAPS capsule Take 1  capsule by mouth once a week 02/23/2024: Takes on Wednesday   [DISCONTINUED] HYDROcodone  bit-homatropine (HYCODAN) 5-1.5 MG/5ML syrup Take 5 mLs by mouth every 6 (six) hours as needed. (Patient not taking: Reported on 03/28/2024)    No facility-administered encounter medications on file as of 04/10/2024.     Follow-Up   Return in about 4 weeks (around 05/08/2024) for For Weight Mangement with Dr. Francyne.SABRA She was informed of the importance of frequent follow up visits to maximize her success with intensive lifestyle modifications for her multiple health conditions.  Attestation Statement   Reviewed by clinician on day of visit: allergies, medications, problem list, medical history, surgical history, family history, social history, and previous encounter notes.     Lucas Francyne, MD

## 2024-04-10 NOTE — Telephone Encounter (Signed)
 Pt called and the medication liraglutide  was to [facility] today after her appt but she is not seeing it ordered.She did go online and get her part set up.

## 2024-04-11 NOTE — Telephone Encounter (Signed)
 Ok for apt 11/19

## 2024-04-12 ENCOUNTER — Ambulatory Visit: Admitting: Internal Medicine

## 2024-04-12 ENCOUNTER — Encounter (HOSPITAL_COMMUNITY)

## 2024-04-12 ENCOUNTER — Telehealth: Payer: Self-pay

## 2024-04-12 ENCOUNTER — Telehealth (HOSPITAL_COMMUNITY): Payer: Self-pay

## 2024-04-12 VITALS — BP 124/80 | HR 80 | Temp 98.2°F | Ht 65.0 in | Wt 203.0 lb

## 2024-04-12 DIAGNOSIS — F331 Major depressive disorder, recurrent, moderate: Secondary | ICD-10-CM | POA: Diagnosis not present

## 2024-04-12 DIAGNOSIS — I5042 Chronic combined systolic (congestive) and diastolic (congestive) heart failure: Secondary | ICD-10-CM

## 2024-04-12 DIAGNOSIS — I10 Essential (primary) hypertension: Secondary | ICD-10-CM | POA: Diagnosis not present

## 2024-04-12 DIAGNOSIS — F410 Panic disorder [episodic paroxysmal anxiety] without agoraphobia: Secondary | ICD-10-CM | POA: Diagnosis not present

## 2024-04-12 MED ORDER — CLONAZEPAM 0.5 MG PO TABS: 0.5000 mg | ORAL_TABLET | Freq: Two times a day (BID) | ORAL | 1 refills | Status: DC | PRN

## 2024-04-12 MED ORDER — ESCITALOPRAM OXALATE 10 MG PO TABS: 10.0000 mg | ORAL_TABLET | Freq: Every day | ORAL | 1 refills | Status: DC

## 2024-04-12 NOTE — Assessment & Plan Note (Signed)
 Recurrent no SI or HI, but for start lexapro  10 mg every day, continue with counseling next wk already scheduled

## 2024-04-12 NOTE — Assessment & Plan Note (Signed)
 BP Readings from Last 3 Encounters:  04/12/24 124/80  04/10/24 112/78  03/28/24 110/72   Stable, pt to continue medical treatment losartan  25 every day, toprol  xl 25 - 1/2 qd

## 2024-04-12 NOTE — Assessment & Plan Note (Signed)
 Mild to mod intermittent, for limited clonazepam 0.5 bid prn panic

## 2024-04-12 NOTE — Telephone Encounter (Signed)
 Appt scheduled for 11/19@320  with Dr. Norleen.

## 2024-04-12 NOTE — Telephone Encounter (Signed)
 Patient c/o for 12:30 CR class due to dr appt.

## 2024-04-12 NOTE — Patient Instructions (Addendum)
 Please take all new medication as prescribed - the lexapro  10 mg per day  Please take all new medication as prescribed - the clonazepam  only as needed for panic  Please continue all other medications as before, and refills have been done if requested.  Please have the pharmacy call with any other refills you may need.  Please keep your appointments with your specialists as you may have planned - counseling Dec 2  Please see your PCP in 1 month

## 2024-04-12 NOTE — Progress Notes (Signed)
 Patient ID: [name], female   DOB: 1975-08-31, 48 y.o.   MRN: [id number]        Chief Complaint: follow up major depression recurrent with anxiety panic, htn, chf       HPI:  [name] is a 48 y.o. female here with c/o 2 wks worsening depressive symptoms associated with anxiety and occasional panic, but no SI or HI.  Already has counseling appt for next Tuesday.  Not currently with medication treatment, though has tried several antidepressants over the year   Pt denies chest pain, increased sob or doe, wheezing, orthopnea, PND, increased LE swelling, palpitations, dizziness or syncope.   Pt denies polydipsia, polyuria, or new focal neuro s/s.         Wt Readings from Last 3 Encounters:  04/12/24 203 lb (92.1 kg)  04/10/24 201 lb (91.2 kg)  03/28/24 204 lb 6.4 oz (92.7 kg)   BP Readings from Last 3 Encounters:  04/12/24 124/80  04/10/24 112/78  03/28/24 110/72         Past Medical History:  Diagnosis Date   Acute on chronic systolic (congestive) heart failure (HCC) 04/29/2017   Acute pain of right shoulder 06/16/2017   ADHD    AICD (automatic cardioverter/defibrillator) present    Anginal pain    Anxiety    Arthritis    right shoulder    Asthma    Back pain    CHF (congestive heart failure) (HCC)    Chronic combined systolic and diastolic heart failure (HCC) 03/05/2014   Closed low lateral malleolus fracture 10/23/2013   Constipation    Cystitis 10/21/2017   Depression    Depression with anxiety 01/20/2013   Diabetes mellitus without complication (HCC)    Diverticulosis    Dyspnea    comes and goes intermittently mostly with exertion    Dysrhythmia    Edema, lower extremity    Essential hypertension    Prev followed by [name]    Fibroid    age 59   Gallstones    Generalized abdominal cramping    History of cardiomyopathy    Hypertension    IBS (irritable bowel syndrome)    ICD (implantable cardioverter-defibrillator), single, in situ  12/14/2016   Insomnia 05/07/2017   Joint pain    Labral tear of shoulder 04/04/2015    Injected 04/04/2015 Injected 12/03/2015    Migraine    monthly (08/03/2016)   Myofascial pain 06/16/2017   NICM (nonischemic cardiomyopathy) (HCC) 08/03/2016   Nonallopathic lesion of lumbosacral region 11/16/2016   Nonallopathic lesion of sacral region 11/16/2016   Nonallopathic lesion of thoracic region 08/20/2014   Nonspecific chest pain 04/28/2017   Obesity    OSA (obstructive sleep apnea) 01/02/2013   NPSG 2009:  AHI 9/hr. CPAP intolerance >> smothering Good tolerance of auto device (optimal pressure 12-13 on download).  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   PVC (premature ventricular contraction) 06/23/2016   Seizures (HCC)    as a child (08/03/2016)   SOB (shortness of breath)    Termination of pregnancy    due to cardiac risk   Vitamin B12 deficiency    Vitamin D  deficiency    Past Surgical History:  Procedure Laterality Date   BREAST BIOPSY  Right 09/30/2022   US  RT BREAST BX W LOC DEV 1ST LESION IMG BX SPEC US  GUIDE 09/30/2022 [facility]   CARDIAC CATHETERIZATION N/A 11/11/2015   Procedure: Right/Left Heart Cath and Coronary Angiography;  Surgeon: [name], MD;  Location: [facility];  Service: Cardiovascular;  Laterality: N/A;   CARDIAC CATHETERIZATION  ~ 2015   CARDIAC DEFIBRILLATOR PLACEMENT  08/03/2016   CESAREAN SECTION  1999   COLONOSCOPY WITH PROPOFOL  N/A 04/21/2016   Procedure: COLONOSCOPY WITH PROPOFOL ;  Surgeon: [name], MD;  Location: [facility];  Service: Gastroenterology;  Laterality: N/A;   ESOPHAGOGASTRODUODENOSCOPY (EGD) WITH PROPOFOL  N/A 04/21/2016   Procedure: ESOPHAGOGASTRODUODENOSCOPY (EGD) WITH PROPOFOL ;  Surgeon: [name], MD;  Location: [facility];   Service: Gastroenterology;  Laterality: N/A;   FOOT FRACTURE SURGERY Right ~ 2003   FRACTURE SURGERY     ICD IMPLANT N/A 08/03/2016   Procedure: ICD Implant;  Surgeon: [name], MD;  Location: [facility] CV [facility];  Service: Cardiovascular;  Laterality: N/A;   IR ANGIOGRAM PELVIS SELECTIVE OR SUPRASELECTIVE  10/28/2022   IR ANGIOGRAM SELECTIVE EACH ADDITIONAL VESSEL  10/28/2022   IR ANGIOGRAM SELECTIVE EACH ADDITIONAL VESSEL  10/28/2022   IR ANGIOGRAM SELECTIVE EACH ADDITIONAL VESSEL  10/28/2022   IR EMBO TUMOR ORGAN ISCHEMIA INFARCT INC GUIDE ROADMAPPING  10/28/2022   IR US  GUIDE VASC ACCESS LEFT  10/28/2022   IR US  GUIDE VASC ACCESS RIGHT  10/28/2022   LAPAROSCOPIC CHOLECYSTECTOMY  12/2006   LAPAROSCOPIC GASTRIC SLEEVE RESECTION     LAPAROSCOPY ABDOMEN DIAGNOSTIC  2008   cut bile duct w/gallbladder OR; had to go in later & fix leak; hospitalized for 2 months   LEFT HEART CATHETERIZATION WITH CORONARY ANGIOGRAM N/A 02/26/2014   Procedure: LEFT HEART CATHETERIZATION WITH CORONARY ANGIOGRAM;  Surgeon: Candyce GORMAN Reek, MD;  Location: [facility] CATH LAB;  Service: Cardiovascular;  Laterality: N/A;   OVARIAN CYST REMOVAL Right 1999   PVC ABLATION N/A 12/01/2017   Procedure: PVC ABLATION;  Surgeon: Waddell Danelle ORN, MD;  Location: [facility];  Service: Cardiovascular;  Laterality: N/A;   RIGHT HEART CATH N/A 08/05/2017   Procedure: RIGHT HEART CATH;  Surgeon: [name], MD;  Location: [facility];  Service: Cardiovascular;  Laterality: N/A;   RIGHT HEART CATH N/A 11/16/2019   Procedure: RIGHT HEART CATH;  Surgeon: [name], MD;  Location: [facility];  Service: Cardiovascular;  Laterality: N/A;   TUBAL LIGATION  1999    reports that she has never smoked. She has never been exposed to tobacco smoke. She has never used smokeless tobacco. She reports that she does not drink alcohol and does not use drugs. family history includes Allergies in her daughter; Emphysema in  her maternal grandmother; Heart disease (age of onset: 65) in her maternal grandmother; Obesity in her mother; Rheum arthritis in her mother. Allergies  Allergen Reactions   Vancomycin  Other (See Comments)    did something to my kidneys, PROGRESSED TO KIDNEY FAILURE!!   Aspirin  Other (See Comments)    Wheezing, (Pt states that she just wheezes some when she takes aspirin  by itself but she can take aspirin  in a combination product).   Contrast Media [Iodinated Contrast Media] Other (See Comments)    Multiple CT contrast studies done over 2 weeks caused ARF   Ciprofloxacin Itching and Rash   Cyclobenzaprine  Other (See Comments)    Can not tolerate   Farxiga  [Dapagliflozin] Rash  Sulfa Antibiotics Itching and Rash   Current Outpatient Medications on File Prior to Visit  Medication Sig Dispense Refill   albuterol  (VENTOLIN  HFA) 108 (90 Base) MCG/ACT inhaler Inhale 1-2 puffs into the lungs every 6 (six) hours as needed for wheezing or shortness of breath. 18 g 0   diclofenac  (VOLTAREN ) 75 MG EC tablet Take 1 tablet by mouth twice daily as needed 60 tablet 0   empagliflozin  (JARDIANCE ) 10 MG TABS tablet Take 1 tablet (10 mg total) by mouth daily before breakfast. 30 tablet 5   ENTRESTO  24-26 MG Take 1 tablet by mouth 2 (two) times daily.     ENTRESTO  49-51 MG Take 1 tablet by mouth 2 (two) times daily.     Insulin  Pen Needle (B-D UF III MINI PEN NEEDLES) 31G X 5 MM MISC 1 Needle by Does not apply route daily. 30 each 0   ipratropium-albuterol  (DUONEB) 0.5-2.5 (3) MG/3ML SOLN USE 1 AMPULE IN NEBULIZER EVERY 6 HOURS AS NEEDED 360 mL 0   ivabradine  (CORLANOR ) 7.5 MG TABS tablet Take 1 tablet (7.5 mg total) by mouth 2 (two) times daily with a meal.     Liraglutide  -Weight Management 18 MG/3ML SOPN Inject 0.6 mg into the skin daily for 7 days, THEN 1.2 mg daily for 7 days, THEN 1.8 mg daily for 14 days. 6 mL 0   losartan  (COZAAR ) 25 MG tablet Take 0.5 tablets (12.5 mg total) by mouth at bedtime. 15  tablet 3   metoprolol  succinate (TOPROL  XL) 25 MG 24 hr tablet Take 0.5 tablets (12.5 mg total) by mouth daily. 15 tablet 5   nitroGLYCERIN  (NITROSTAT ) 0.4 MG SL tablet Place 1 tablet (0.4 mg total) under the tongue every 5 (five) minutes as needed for chest pain. 75 tablet 0   OVER THE COUNTER MEDICATION Take 1 capsule by mouth daily. BARIATRIC VITAMIN     spironolactone  (ALDACTONE ) 25 MG tablet Take 1 tablet (25 mg total) by mouth daily. 30 tablet 5   tizanidine  (ZANAFLEX ) 2 MG capsule Take 2 mg by mouth as needed for muscle spasms.     torsemide  (DEMADEX ) 20 MG tablet Take 1 tablet (20 mg total) by mouth daily as needed (for weight gain and swelling). (Patient taking differently: Take 20 mg by mouth every other day.)     Ubrogepant  (UBRELVY ) 100 MG TABS Take 1 tablet (100 mg total) by mouth daily as needed (migraine). 30 tablet 3   Vitamin D , Ergocalciferol , (DRISDOL ) 1.25 MG (50000 UNIT) CAPS capsule Take 1 capsule by mouth once a week 12 capsule 0   No current facility-administered medications on file prior to visit.        ROS:  All others reviewed and negative.  Objective        PE:  BP 124/80 (BP Location: Right Arm, Patient Position: Sitting, Cuff Size: Normal)   Pulse 80   Temp 98.2 F (36.8 C) (Oral)   Ht 5' 5 (1.651 m)   Wt 203 lb (92.1 kg)   LMP  (LMP Unknown)   SpO2 98%   BMI 33.78 kg/m                 Constitutional: Pt appears in NAD               HENT: Head: NCAT.                Right Ear: External ear normal.  Left Ear: External ear normal.                Eyes: . Pupils are equal, round, and reactive to light. Conjunctivae and EOM are normal               Nose: without d/c or deformity               Neck: Neck supple. Gross normal ROM               Cardiovascular: Normal rate and regular rhythm.                 Pulmonary/Chest: Effort normal and breath sounds without rales or wheezing.                               Neurological: Pt is alert. At  baseline orientation, motor grossly intact               Skin: Skin is warm. No rashes, no other new lesions, LE edema - none               Psychiatric: Pt behavior is normal without agitation , depressed affect with nervous  Micro: none  Cardiac tracings I have personally interpreted today:  none  Pertinent Radiological findings (summarize): none   Lab Results  Component Value Date   WBC 9.7 02/26/2024   HGB 15.0 02/26/2024   HCT 46.9 (H) 02/26/2024   PLT 289 02/26/2024   GLUCOSE 119 (H) 04/05/2024   CHOL 164 06/11/2023   TRIG 66.0 06/11/2023   HDL 67.00 06/11/2023   LDLCALC 83 06/11/2023   ALT 30 01/21/2023   AST 38 01/21/2023   NA 137 04/05/2024   K 3.6 04/05/2024   CL 92 (L) 04/05/2024   CREATININE 1.12 (H) 04/05/2024   BUN 12 04/05/2024   CO2 29 04/05/2024   TSH 1.750 07/23/2023   INR 1.0 10/28/2022   HGBA1C 5.2 07/16/2023   Assessment/Plan:  [name] is a 48 y.o. Black or African American [2] female with  has a past medical history of Acute on chronic systolic (congestive) heart failure (HCC) (04/29/2017), Acute pain of right shoulder (06/16/2017), ADHD, AICD (automatic cardioverter/defibrillator) present, Anginal pain, Anxiety, Arthritis, Asthma, Back pain, CHF (congestive heart failure) (HCC), Chronic combined systolic and diastolic heart failure (HCC) (89/87/7984), Closed low lateral malleolus fracture (10/23/2013), Constipation, Cystitis (10/21/2017), Depression, Depression with anxiety (01/20/2013), Diabetes mellitus without complication (HCC), Diverticulosis, Dyspnea, Dysrhythmia, Edema, lower extremity, Essential hypertension, Fibroid, Gallstones, Generalized abdominal cramping, History of cardiomyopathy, Hypertension, IBS (irritable bowel syndrome), ICD (implantable cardioverter-defibrillator), single, in situ (12/14/2016), Insomnia (05/07/2017), Joint pain, Labral tear of shoulder (04/04/2015), Migraine, Myofascial pain (06/16/2017), NICM (nonischemic  cardiomyopathy) (HCC) (08/03/2016), Nonallopathic lesion of lumbosacral region (11/16/2016), Nonallopathic lesion of sacral region (11/16/2016), Nonallopathic lesion of thoracic region (08/20/2014), Nonspecific chest pain (04/28/2017), Obesity, OSA (obstructive sleep apnea) (01/02/2013), OSA on CPAP, Ovarian cyst, Palpitations, Patellofemoral syndrome of both knees (10/16/2016), Postpartum cardiomyopathy, PVC (premature ventricular contraction) (06/23/2016), Seizures (HCC), SOB (shortness of breath), Termination of pregnancy, Vitamin B12 deficiency, and Vitamin D  deficiency.  Chronic combined systolic and diastolic heart failure (HCC) Stable overall, cont current med tx  MDD (major depressive disorder) Recurrent no SI or HI, but for start lexapro  10 mg every day, continue with counseling next wk already scheduled  Essential hypertension BP Readings from Last 3 Encounters:  04/12/24 124/80  04/10/24 112/78  03/28/24 110/72  Stable, pt to continue medical treatment losartan  25 every day, toprol  xl 25 - 1/2 qd   Panic anxiety syndrome Mild to mod intermittent, for limited clonazepam  0.5 bid prn panic  Followup: No follow-ups on file.  Lynwood Rush, MD 04/12/2024 6:57 PM [facility]

## 2024-04-12 NOTE — Assessment & Plan Note (Signed)
Stable overall, cont current med tx 

## 2024-04-13 ENCOUNTER — Encounter: Payer: Self-pay | Admitting: Internal Medicine

## 2024-04-13 ENCOUNTER — Telehealth: Payer: Self-pay

## 2024-04-13 ENCOUNTER — Encounter: Payer: Self-pay | Admitting: *Deleted

## 2024-04-13 ENCOUNTER — Ambulatory Visit: Attending: Cardiology

## 2024-04-13 ENCOUNTER — Ambulatory Visit: Admitting: Cardiology

## 2024-04-13 ENCOUNTER — Encounter: Payer: Self-pay | Admitting: Cardiology

## 2024-04-13 VITALS — BP 118/76 | HR 84 | Ht 65.0 in | Wt 205.0 lb

## 2024-04-13 DIAGNOSIS — G4733 Obstructive sleep apnea (adult) (pediatric): Secondary | ICD-10-CM

## 2024-04-13 DIAGNOSIS — I10 Essential (primary) hypertension: Secondary | ICD-10-CM

## 2024-04-13 DIAGNOSIS — Z95810 Presence of automatic (implantable) cardiac defibrillator: Secondary | ICD-10-CM | POA: Diagnosis not present

## 2024-04-13 DIAGNOSIS — I5022 Chronic systolic (congestive) heart failure: Secondary | ICD-10-CM | POA: Diagnosis not present

## 2024-04-13 NOTE — Progress Notes (Signed)
 Sleep Medicine CONSULT Note    Date:  04/13/2024   ID:  [name], DOB 02-09-1976, MRN [id number]  PCP:  Rollene Almarie LABOR, MD  Cardiologist: [name], MD (Inactive)   Chief Complaint  Patient presents with   New Patient (Initial Visit)    OSA    History of Present Illness:  [name] is a 48 y.o. female who is being seen today for the evaluation of OSA at the request of [name], MD.  This is a 48yo AA female with a hx of chronic combined systolic/diastolic CHF, DM, HTN, and OSA (NPSG AHI 9 in 2009) and had been on CPAP followed by [name]. She had a sleep study in 2019 and 2021 both of which were negative for OSA. When she saw [name] 10/2023 she complained of excessive daytime sleepiness so a HST was ordered.  This showed mild OSA with an AHI of 9.5/hr with moderate to severe snoring and nocturnal hypoxemia with O2 sat nadir 84%. She was started on auto CPAP.  She is now referred for sleep medicine consultation for treatment of OSA.  She is doing well with his PAP device and thinks that she has gotten used to it.  He tolerates the nasal pillow mask and feels the pressure is adequate.  Since going on PAP she feels rested in the am and has no significant daytime sleepiness.  She denies any significant or nasal congestion.  She uses a nasal pillow mask and has been having problems with mouth dryness. She does not think that she snores. Patient denies any episodes of bruxism, restless legs, No gagging hallucinations or cataplectic events.    Past Medical History:  Diagnosis Date   Acute on chronic systolic (congestive) heart failure (HCC) 04/29/2017   Acute pain of right shoulder 06/16/2017   ADHD    AICD (automatic cardioverter/defibrillator) present    Anginal pain    Anxiety    Arthritis    right shoulder    Asthma    Back pain    CHF (congestive heart failure) (HCC)    Chronic combined systolic and diastolic heart failure  (HCC) 89/87/7984   Closed low lateral malleolus fracture 10/23/2013   Constipation    Cystitis 10/21/2017   Depression    Depression with anxiety 01/20/2013   Diabetes mellitus without complication (HCC)    Diverticulosis    Dyspnea    comes and goes intermittently mostly with exertion    Dysrhythmia    Edema, lower extremity    Essential hypertension    Prev followed by [name]    Fibroid    age 82   Gallstones    Generalized abdominal cramping    History of cardiomyopathy    Hypertension    IBS (irritable bowel syndrome)    ICD (implantable cardioverter-defibrillator), single, 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 CPAP intolerance >> smothering Good tolerance of auto device (  optimal pressure 12-13 on download).  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   PVC (premature ventricular contraction) 06/23/2016   Seizures (HCC)    as a child (08/03/2016)   SOB (shortness of breath)    Termination of pregnancy    due to cardiac risk   Vitamin B12 deficiency    Vitamin D  deficiency     Past Surgical History:  Procedure Laterality Date   BREAST BIOPSY Right 09/30/2022   US  RT BREAST BX W LOC DEV 1ST LESION IMG BX SPEC US  GUIDE 09/30/2022 [facility]   CARDIAC CATHETERIZATION N/A 11/11/2015    Procedure: Right/Left Heart Cath and Coronary Angiography;  Surgeon: [name], MD;  Location: [facility];  Service: Cardiovascular;  Laterality: N/A;   CARDIAC CATHETERIZATION  ~ 2015   CARDIAC DEFIBRILLATOR PLACEMENT  08/03/2016   CESAREAN SECTION  1999   COLONOSCOPY WITH PROPOFOL  N/A 04/21/2016   Procedure: COLONOSCOPY WITH PROPOFOL ;  Surgeon: [name], MD;  Location: [facility];  Service: Gastroenterology;  Laterality: N/A;   ESOPHAGOGASTRODUODENOSCOPY (EGD) WITH PROPOFOL  N/A 04/21/2016   Procedure: ESOPHAGOGASTRODUODENOSCOPY (EGD) WITH PROPOFOL ;  Surgeon: [name], MD;  Location: [facility];  Service: Gastroenterology;  Laterality: N/A;   FOOT FRACTURE SURGERY Right ~ 2003   FRACTURE SURGERY     ICD IMPLANT N/A 08/03/2016   Procedure: ICD Implant;  Surgeon: [name], MD;  Location: [facility] CV LAB;  Service: Cardiovascular;  Laterality: N/A;   IR ANGIOGRAM PELVIS SELECTIVE OR SUPRASELECTIVE  10/28/2022   IR ANGIOGRAM SELECTIVE EACH ADDITIONAL VESSEL  10/28/2022   IR ANGIOGRAM SELECTIVE EACH ADDITIONAL VESSEL  10/28/2022   IR ANGIOGRAM SELECTIVE EACH ADDITIONAL VESSEL  10/28/2022   IR EMBO TUMOR ORGAN ISCHEMIA INFARCT INC GUIDE ROADMAPPING  10/28/2022   IR US  GUIDE VASC ACCESS LEFT  10/28/2022   IR US  GUIDE VASC ACCESS RIGHT  10/28/2022   LAPAROSCOPIC CHOLECYSTECTOMY  12/2006   LAPAROSCOPIC GASTRIC SLEEVE RESECTION     LAPAROSCOPY ABDOMEN DIAGNOSTIC  2008   cut bile duct w/gallbladder OR; had to go in later & fix leak; hospitalized for 2 months   LEFT HEART CATHETERIZATION WITH CORONARY ANGIOGRAM N/A 02/26/2014   Procedure: LEFT HEART CATHETERIZATION WITH CORONARY ANGIOGRAM;  Surgeon: Candyce GORMAN Reek, MD;  Location: [facility] CATH LAB;  Service: Cardiovascular;  Laterality: N/A;   OVARIAN CYST REMOVAL Right 1999   PVC ABLATION N/A 12/01/2017   Procedure: PVC ABLATION;  Surgeon: Waddell Danelle ORN, MD;  Location: [facility];  Service: Cardiovascular;  Laterality:  N/A;   RIGHT HEART CATH N/A 08/05/2017   Procedure: RIGHT HEART CATH;  Surgeon: [name], MD;  Location: [facility];  Service: Cardiovascular;  Laterality: N/A;   RIGHT HEART CATH N/A 11/16/2019   Procedure: RIGHT HEART CATH;  Surgeon: [name], MD;  Location: [facility];  Service: Cardiovascular;  Laterality: N/A;   TUBAL LIGATION  1999    Current Medications: Current Meds  Medication Sig   albuterol  (VENTOLIN  HFA) 108 (90 Base) MCG/ACT inhaler Inhale 1-2 puffs into the lungs every 6 (six) hours as needed for wheezing or shortness of breath.   clonazePAM (KLONOPIN) 0.5 MG tablet Take 1 tablet (0.5 mg total) by mouth  2 (two) times daily as needed for anxiety.   diclofenac  (VOLTAREN ) 75 MG EC tablet Take 1 tablet by mouth twice daily as needed   empagliflozin  (JARDIANCE ) 10 MG TABS tablet Take 1 tablet (10 mg total) by mouth daily before breakfast.   ENTRESTO  24-26 MG Take 1 tablet by mouth 2 (two) times daily.   ENTRESTO  49-51 MG Take 1 tablet by mouth 2 (two) times daily.   escitalopram  (LEXAPRO ) 10 MG tablet Take 1 tablet (10 mg total) by mouth daily.   Insulin  Pen Needle (B-D UF III MINI PEN NEEDLES) 31G X 5 MM MISC 1 Needle by Does not apply route daily.   ipratropium-albuterol  (DUONEB) 0.5-2.5 (3) MG/3ML SOLN USE 1 AMPULE IN NEBULIZER EVERY 6 HOURS AS NEEDED   ivabradine  (CORLANOR ) 7.5 MG TABS tablet Take 1 tablet (7.5 mg total) by mouth 2 (two) times daily with a meal.   Liraglutide  -Weight Management 18 MG/3ML SOPN Inject 0.6 mg into the skin daily for 7 days, THEN 1.2 mg daily for 7 days, THEN 1.8 mg daily for 14 days.   losartan  (COZAAR ) 25 MG tablet Take 0.5 tablets (12.5 mg total) by mouth at bedtime.   metoprolol  succinate (TOPROL  XL) 25 MG 24 hr tablet Take 0.5 tablets (12.5 mg total) by mouth daily.   nitroGLYCERIN  (NITROSTAT ) 0.4 MG SL tablet Place 1 tablet (0.4 mg total) under the tongue every 5 (five) minutes as needed for chest pain.    OVER THE COUNTER MEDICATION Take 1 capsule by mouth daily. BARIATRIC VITAMIN   spironolactone  (ALDACTONE ) 25 MG tablet Take 1 tablet (25 mg total) by mouth daily.   tizanidine  (ZANAFLEX ) 2 MG capsule Take 2 mg by mouth as needed for muscle spasms.   torsemide  (DEMADEX ) 20 MG tablet Take 1 tablet (20 mg total) by mouth daily as needed (for weight gain and swelling). (Patient taking differently: Take 20 mg by mouth every other day.)   Ubrogepant  (UBRELVY ) 100 MG TABS Take 1 tablet (100 mg total) by mouth daily as needed (migraine).   Vitamin D , Ergocalciferol , (DRISDOL ) 1.25 MG (50000 UNIT) CAPS capsule Take 1 capsule by mouth once a week    Allergies:   Vancomycin , Aspirin , Contrast media [iodinated contrast media], Ciprofloxacin, Cyclobenzaprine , Farxiga  [dapagliflozin], and Sulfa antibiotics   Social History   Socioeconomic History   Marital status: Married    Spouse name: Beryl   Number of children: 1   Years of education: Not on file   Highest education level: Associate degree: academic program  Occupational History   Occupation: stay at home mom  Tobacco Use   Smoking status: Never    Passive exposure: Never   Smokeless tobacco: Never  Vaping Use   Vaping status: Never Used  Substance and Sexual Activity   Alcohol use: No   Drug use: No   Sexual activity: Yes    Birth control/protection: Surgical    Comment: BTL, Ablation  Other Topics Concern   Not on file  Social History Narrative   Lives with husband, daughter niece and great nephew   Social Drivers of Corporate Investment Banker Strain: Low Risk  (04/12/2024)   Overall Financial Resource Strain (CARDIA)    Difficulty of Paying Living Expenses: Not hard at all  Food Insecurity: No Food Insecurity (04/12/2024)   Hunger Vital Sign    Worried About Running Out of Food in the Last Year: Never true    Ran Out of Food in the Last Year: Never true  Transportation  Needs: No Transportation Needs (04/12/2024)   PRAPARE -  Administrator, Civil Service (Medical): No    Lack of Transportation (Non-Medical): No  Physical Activity: Sufficiently Active (04/12/2024)   Exercise Vital Sign    Days of Exercise per Week: 4 days    Minutes of Exercise per Session: 40 min  Stress: Stress Concern Present (04/12/2024)   [employer] of Occupational Health - Occupational Stress Questionnaire    Feeling of Stress: Very much  Social Connections: Socially Isolated (04/12/2024)   Social Connection and Isolation Panel    Frequency of Communication with Friends and Family: Never    Frequency of Social Gatherings with Friends and Family: Never    Attends Religious Services: Never    Database Administrator or Organizations: No    Attends Engineer, Structural: Not on file    Marital Status: Married     Family History:  The patient's family history includes Allergies in her daughter; Emphysema in her maternal grandmother; Heart disease (age of onset: 42) in her maternal grandmother; Obesity in her mother; Rheum arthritis in her mother.   ROS:   Please see the history of present illness.    ROS All other systems reviewed and are negative.      No data to display             PHYSICAL EXAM:   VS:  BP 118/76   Pulse 84   Ht 5' 5 (1.651 m)   Wt 205 lb (93 kg)   LMP  (LMP Unknown)   SpO2 98%   BMI 34.11 kg/m    GEN: Well nourished, well developed, in no acute distress  HEENT: normal  Neck: no JVD, carotid bruits, or masses Cardiac: RRR; no murmurs, rubs, or gallops,no edema.  Intact distal pulses bilaterally.  Respiratory:  clear to auscultation bilaterally, normal work of breathing GI: soft, nontender, nondistended, + BS MS: no deformity or atrophy  Skin: warm and dry, no rash Neuro:  Alert and Oriented x 3, Strength and sensation are intact Psych: euthymic mood, full affect  Wt Readings from Last 3 Encounters:  04/13/24 205 lb (93 kg)  04/12/24 203 lb (92.1 kg)  04/10/24 201 lb  (91.2 kg)      Studies/Labs Reviewed:   HST, PAP compliance download  Recent Labs: 07/23/2023: TSH 1.750 02/26/2024: Hemoglobin 15.0; Magnesium  2.4; Platelets 289 03/01/2024: B Natriuretic Peptide 48.2 04/05/2024: BUN 12; Creatinine, Ser 1.12; Potassium 3.6; Sodium 137     ASSESSMENT:    1. OSA (obstructive sleep apnea)   2. Essential hypertension      PLAN:  In order of problems listed above:  OSA - The patient is tolerating PAP therapy well without any problems. The PAP download performed by his DME was personally reviewed and interpreted by me today and showed an AHI of 2/hr on auto CPAP from 4 to 15 cm H2O with 53% compliance in using more than 4 hours nightly.  The patient has been using and benefiting from PAP use and will continue to benefit from therapy.  -Her compliance was down due to having a viral URI and she could not use her device  HTN -BP controlled on exam today -continue Losartan  12.5mg  daily,Toprol  XL 12.5mg  daily and spiro 25mg  daily with PRN refills.   Time Spent: 20 minutes total time of encounter, including 15 minutes spent in face-to-face patient care on the date of this encounter. This time includes coordination of care and counseling  regarding above mentioned problem list. Remainder of non-face-to-face time involved reviewing chart documents/testing relevant to the patient encounter and documentation in the medical record. I have independently reviewed documentation from referring provider  Medication Adjustments/Labs and Tests Ordered: Current medicines are reviewed at length with the patient today.  Concerns regarding medicines are outlined above.  Medication changes, Labs and Tests ordered today are listed in the Patient Instructions below.  There are no Patient Instructions on file for this visit.   Signed, Wilbert Bihari, MD  04/13/2024 8:29 AM    [facility] [address] [place], [place], [place]  [address] Phone: [phone]; Fax: [phone]

## 2024-04-13 NOTE — Telephone Encounter (Signed)
 Remote ICM transmission received.  Attempted call to patient regarding ICM remote transmission.  Left detailed message per DPR with ICM phone number to return call for any questions, concerns or fluid symptoms.

## 2024-04-13 NOTE — Progress Notes (Signed)
 Pt returned call.  She stated she was sick prior to 04/07/2024 and increased fluid intake and was not eating causing her weight to significantly increase (212 lbs) when she returned to rehab.  She also had tightness in hands and swelling of ankles when she was drinking a lot of fluid.  She has not weighed at home but cardiac rehab scales shows weight returning close to baseline.  She took Metolazone  with 1 Torsemide  on 04/12/2024 and feels fluid has resolved.    She is taking Torsemide  daily instead of PRN.     Advised Index and impedance both suggest fluid levels are returning to normal.     She will continue Torsemide  20 mg daily for now.     Will recheck fluid levels on 04/17/2024.

## 2024-04-13 NOTE — Patient Instructions (Signed)
 Medication Instructions:  Your physician recommends that you continue on your current medications as directed. Please refer to the Current Medication list given to you today.  *If you need a refill on your cardiac medications before your next appointment, please call your pharmacy*  Lab Work: None.  If you have labs (blood work) drawn today and your tests are completely normal, you will receive your results only by: MyChart Message (if you have MyChart) OR A paper copy in the mail If you have any lab test that is abnormal or we need to change your treatment, we will call you to review the results.  Testing/Procedures: None.  Follow-Up: At [facility], you and your health needs are our priority.  As part of our continuing mission to provide you with exceptional heart care, our providers are all part of one team.  This team includes your primary Cardiologist (physician) and Advanced Practice Providers or APPs (Physician Assistants and Nurse Practitioners) who all work together to provide you with the care you need, when you need it.  Your next appointment:   1 year(s)  Provider:   Dr. Gaylyn Keas, MD

## 2024-04-13 NOTE — Progress Notes (Signed)
 EPIC Encounter for ICM Monitoring  Patient Name: [name] is a 48 y.o. female Date: 04/13/2024 Primary Care Physican: Rollene Almarie LABOR, MD Primary Cardiologist: Bensimhon Electrophysiologist: Inocencio 12/28/2022 Weight: 218 lbs       02/03/2023 Weight: 200 lbs 07/28/2023 Weight: 209 lbs 08/05/2023 Office Weight: 215 lbs 09/27/2023 Weight: 217 lbs 01/25/2024 Office Weight: 210 lbs 03/06/2024 Office Weight: 202 lbs 03/28/2024 Office Weight: 204 lbs   Attempted call to patient and unable to reach.  Left detailed message per DPR regarding transmission.  Transmission results reviewed.    Since 03/13/2024 ICM Remote Transmission: [facility] HF Index is 21 suggesting possible fluid accumulation starting 03/30/2024 but trending down (highest index was 25 04/07/2024).  Thoracic impedance low 04/04/2024 but trending up suggesting fluid levels are improvement.   Prescribed: Torsemide  20 mg Take 1 tablet (20 mg total) by mouth as needed (for weight gain and swelling).  Per 03/28/2024 HF clinic note she takes every other day   Labs: 04/05/2024 Creatinine 1.12, BUN 12, Potassium 3.6, Sodium 137, GFR 60 03/08/2024 Creatinine 0.95, BUN 18, Potassium 3.9, Sodium 136, GFR >60  03/01/2024 Creatinine 0.97, BUN 23, Potassium 4.3, Sodium 139, GFR >60  02/26/2024 Creatinine 1.72, BUN 42, Potassium 2.9, Sodium 134, GFR 36  02/23/2024 Creatinine 1.16, BUN 15, Potassium 4.1, Sodium 135, GFR 58 01/31/2024 Creatinine 0.85, BUN 15, Potassium 137, Sodium 137, GFR >60 11/29/2023 Creatinine 0.70, BUN 8,   Potassium 3.9, Sodium 145, GFR >90 11/15/2023 Creatinine 0.83, BUN 8,   Potassium 4.3, Sodium 141, GFR >60 A complete set of results can be found in Results Review.   Recommendations:  Left voice mail with ICM number and encouraged to call if experiencing any fluid symptoms.  Will send to HF clinic for review if patient is reached.      Follow-up plan: ICM clinic phone appointment 04/18/2024 to recheck fluid  levels.  91 day device clinic remote transmission 06/21/2024.              EP/Cardiology next office visit:  05/04/2024 with Dr Cherrie.  Recall 03/16/2025 with Dr Almetta.          Copy of ICM check sent to Dr. Inocencio.   Remote Monitoring Medically Necessary for Heart Failure Management.  3 Month [facility] Heart Failure Index:    8 Day Data Trend:          [name], RN 04/13/2024 8:18 AM

## 2024-04-13 NOTE — Telephone Encounter (Signed)
 Opened in error

## 2024-04-14 ENCOUNTER — Encounter (HOSPITAL_COMMUNITY)

## 2024-04-17 ENCOUNTER — Encounter (HOSPITAL_COMMUNITY)

## 2024-04-17 NOTE — Progress Notes (Deleted)
 [name] [address] [place] [address] Phone: [phone] Subjective:    I'm seeing this patient by the request  of:  Rollene Almarie LABOR, MD  CC:   [name]  02/23/2024 Attempted injection today secondary to the recurrence feeling today.  Encourage patient to wear the brace on a more regular basis at this time.  Discussed icing regimen and home exercises, discussed which activities to do and which ones to avoid.  Increase activity slowly again but wear the Tru pull lite brace on a more regular basis initially.  Follow-up with me again in 6 to 8 weeks otherwise I do believe that worsening pain or no improvement do need to consider the advancement MRI imaging.  Patient does have an ICD and we need to do it at the hospital.     Updated 04/24/2024 [name] is a 48 y.o. female coming in with complaint of L knee pain      Past Medical History:  Diagnosis Date   Acute on chronic systolic (congestive) heart failure (HCC) 04/29/2017   Acute pain of right shoulder 06/16/2017   ADHD    AICD (automatic cardioverter/defibrillator) present    Anginal pain    Anxiety    Arthritis    right shoulder    Asthma    Back pain    CHF (congestive heart failure) (HCC)    Chronic combined systolic and diastolic heart failure (HCC) 03/05/2014   Closed low lateral malleolus fracture 10/23/2013   Constipation    Cystitis 10/21/2017   Depression    Depression with anxiety 01/20/2013   Diabetes mellitus without complication (HCC)    Diverticulosis    Dyspnea    comes and goes intermittently mostly with exertion    Dysrhythmia    Edema, lower extremity    Essential hypertension    Prev followed by [name]    Fibroid    age 37   Gallstones    Generalized abdominal cramping    History of cardiomyopathy    Hypertension    IBS (irritable bowel syndrome)    ICD (implantable cardioverter-defibrillator), single, 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    Insomnia 05/07/2017   Joint pain    Labral tear of shoulder 04/04/2015    Injected 04/04/2015 Injected 12/03/2015    Migraine    monthly (08/03/2016)   Myofascial pain 06/16/2017   NICM (nonischemic cardiomyopathy) (HCC) 08/03/2016   Nonallopathic lesion of lumbosacral region 11/16/2016   Nonallopathic lesion of sacral region 11/16/2016   Nonallopathic lesion of thoracic region 08/20/2014   Nonspecific chest pain 04/28/2017   Obesity    OSA (obstructive sleep apnea) 01/02/2013   NPSG 2009:  AHI 9/hr. CPAP intolerance >> smothering Good tolerance of auto device (optimal pressure 12-13 on download).  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   PVC (premature ventricular contraction) 06/23/2016   Seizures (HCC)    as a child (08/03/2016)   SOB (shortness of breath)    Termination of pregnancy    due to cardiac risk   Vitamin B12 deficiency    Vitamin D  deficiency    Past Surgical History:  Procedure Laterality Date   BREAST BIOPSY Right 09/30/2022   US  RT BREAST BX W LOC DEV 1ST LESION  IMG BX SPEC US  GUIDE 09/30/2022 [facility]   CARDIAC CATHETERIZATION N/A 11/11/2015   Procedure: Right/Left Heart Cath and Coronary Angiography;  Surgeon: [name], MD;  Location: [facility];  Service: Cardiovascular;  Laterality: N/A;   CARDIAC CATHETERIZATION  ~ 2015   CARDIAC DEFIBRILLATOR PLACEMENT  08/03/2016   CESAREAN SECTION  1999   COLONOSCOPY WITH PROPOFOL  N/A 04/21/2016   Procedure: COLONOSCOPY WITH PROPOFOL ;  Surgeon: [name], MD;  Location: [facility];  Service: Gastroenterology;  Laterality: N/A;   ESOPHAGOGASTRODUODENOSCOPY (EGD) WITH PROPOFOL  N/A 04/21/2016   Procedure: ESOPHAGOGASTRODUODENOSCOPY (EGD) WITH PROPOFOL ;  Surgeon: [name], MD;  Location: [facility];  Service:  Gastroenterology;  Laterality: N/A;   FOOT FRACTURE SURGERY Right ~ 2003   FRACTURE SURGERY     ICD IMPLANT N/A 08/03/2016   Procedure: ICD Implant;  Surgeon: [name], MD;  Location: [facility] INVASIVE CV LAB;  Service: Cardiovascular;  Laterality: N/A;   IR ANGIOGRAM PELVIS SELECTIVE OR SUPRASELECTIVE  10/28/2022   IR ANGIOGRAM SELECTIVE EACH ADDITIONAL VESSEL  10/28/2022   IR ANGIOGRAM SELECTIVE EACH ADDITIONAL VESSEL  10/28/2022   IR ANGIOGRAM SELECTIVE EACH ADDITIONAL VESSEL  10/28/2022   IR EMBO TUMOR ORGAN ISCHEMIA INFARCT INC GUIDE ROADMAPPING  10/28/2022   IR US  GUIDE VASC ACCESS LEFT  10/28/2022   IR US  GUIDE VASC ACCESS RIGHT  10/28/2022   LAPAROSCOPIC CHOLECYSTECTOMY  12/2006   LAPAROSCOPIC GASTRIC SLEEVE RESECTION     LAPAROSCOPY ABDOMEN DIAGNOSTIC  2008   cut bile duct w/gallbladder OR; had to go in later & fix leak; hospitalized for 2 months   LEFT HEART CATHETERIZATION WITH CORONARY ANGIOGRAM N/A 02/26/2014   Procedure: LEFT HEART CATHETERIZATION WITH CORONARY ANGIOGRAM;  Surgeon: Candyce GORMAN Reek, MD;  Location: [facility] CATH LAB;  Service: Cardiovascular;  Laterality: N/A;   OVARIAN CYST REMOVAL Right 1999   PVC ABLATION N/A 12/01/2017   Procedure: PVC ABLATION;  Surgeon: Waddell Danelle ORN, MD;  Location: [facility];  Service: Cardiovascular;  Laterality: N/A;   RIGHT HEART CATH N/A 08/05/2017   Procedure: RIGHT HEART CATH;  Surgeon: [name], MD;  Location: [facility];  Service: Cardiovascular;  Laterality: N/A;   RIGHT HEART CATH N/A 11/16/2019   Procedure: RIGHT HEART CATH;  Surgeon: [name], MD;  Location: [facility];  Service: Cardiovascular;  Laterality: N/A;   TUBAL LIGATION  1999   Social History   Socioeconomic History   Marital status: Married    Spouse name: Beryl   Number of children: 1   Years of education: Not on file   Highest education level: Associate degree: academic program  Occupational History   Occupation: stay at  home mom  Tobacco Use   Smoking status: Never    Passive exposure: Never   Smokeless tobacco: Never  Vaping Use   Vaping status: Never Used  Substance and Sexual Activity   Alcohol use: No   Drug use: No   Sexual activity: Yes    Birth control/protection: Surgical    Comment: BTL, Ablation  Other Topics Concern   Not on file  Social History Narrative   Lives with husband, daughter niece and great nephew   Social Drivers of Health   Financial Resource Strain: Low Risk  (04/12/2024)   Overall Financial Resource Strain (CARDIA)    Difficulty of Paying Living Expenses: Not hard at all  Food Insecurity: No Food Insecurity (04/12/2024)   Hunger Vital Sign  Worried About Programme Researcher, Broadcasting/film/video in the Last Year: Never true    Ran Out of Food in the Last Year: Never true  Transportation Needs: No Transportation Needs (04/12/2024)   PRAPARE - Administrator, Civil Service (Medical): No    Lack of Transportation (Non-Medical): No  Physical Activity: Sufficiently Active (04/12/2024)   Exercise Vital Sign    Days of Exercise per Week: 4 days    Minutes of Exercise per Session: 40 min  Stress: Stress Concern Present (04/12/2024)   [employer] of Occupational Health - Occupational Stress Questionnaire    Feeling of Stress: Very much  Social Connections: Socially Isolated (04/12/2024)   Social Connection and Isolation Panel    Frequency of Communication with Friends and Family: Never    Frequency of Social Gatherings with Friends and Family: Never    Attends Religious Services: Never    Database Administrator or Organizations: No    Attends Engineer, Structural: Not on file    Marital Status: Married   Allergies  Allergen Reactions   Vancomycin  Other (See Comments)    did something to my kidneys, PROGRESSED TO KIDNEY FAILURE!!   Aspirin  Other (See Comments)    Wheezing, (Pt states that she just wheezes some when she takes aspirin  by itself but she can  take aspirin  in a combination product).   Contrast Media [Iodinated Contrast Media] Other (See Comments)    Multiple CT contrast studies done over 2 weeks caused ARF   Ciprofloxacin Itching and Rash   Cyclobenzaprine  Other (See Comments)    Can not tolerate   Farxiga  [Dapagliflozin] Rash   Sulfa Antibiotics Itching and Rash   Family History  Problem Relation Age of Onset   Rheum arthritis Mother    Obesity Mother    Emphysema Maternal Grandmother        smoked   Heart disease Maternal Grandmother 44       MI   Allergies Daughter    Colon cancer Neg Hx     Current Outpatient Medications (Endocrine & Metabolic):    empagliflozin  (JARDIANCE ) 10 MG TABS tablet, Take 1 tablet (10 mg total) by mouth daily before breakfast.  Current Outpatient Medications (Cardiovascular):    ENTRESTO  24-26 MG, Take 1 tablet by mouth 2 (two) times daily.   ENTRESTO  49-51 MG, Take 1 tablet by mouth 2 (two) times daily.   ivabradine  (CORLANOR ) 7.5 MG TABS tablet, Take 1 tablet (7.5 mg total) by mouth 2 (two) times daily with a meal.   losartan  (COZAAR ) 25 MG tablet, Take 0.5 tablets (12.5 mg total) by mouth at bedtime.   metoprolol  succinate (TOPROL  XL) 25 MG 24 hr tablet, Take 0.5 tablets (12.5 mg total) by mouth daily.   nitroGLYCERIN  (NITROSTAT ) 0.4 MG SL tablet, Place 1 tablet (0.4 mg total) under the tongue every 5 (five) minutes as needed for chest pain.   spironolactone  (ALDACTONE ) 25 MG tablet, Take 1 tablet (25 mg total) by mouth daily.   torsemide  (DEMADEX ) 20 MG tablet, Take 1 tablet (20 mg total) by mouth daily as needed (for weight gain and swelling). (Patient taking differently: Take 20 mg by mouth every other day.)  Current Outpatient Medications (Respiratory):    albuterol  (VENTOLIN  HFA) 108 (90 Base) MCG/ACT inhaler, Inhale 1-2 puffs into the lungs every 6 (six) hours as needed for wheezing or shortness of breath.   ipratropium-albuterol  (DUONEB) 0.5-2.5 (3) MG/3ML SOLN, USE 1 AMPULE IN  NEBULIZER EVERY 6 HOURS AS  NEEDED  Current Outpatient Medications (Analgesics):    diclofenac  (VOLTAREN ) 75 MG EC tablet, Take 1 tablet by mouth twice daily as needed   Ubrogepant  (UBRELVY ) 100 MG TABS, Take 1 tablet (100 mg total) by mouth daily as needed (migraine).   Current Outpatient Medications (Other):    clonazePAM  (KLONOPIN ) 0.5 MG tablet, Take 1 tablet (0.5 mg total) by mouth 2 (two) times daily as needed for anxiety.   escitalopram  (LEXAPRO ) 10 MG tablet, Take 1 tablet (10 mg total) by mouth daily.   Insulin  Pen Needle (B-D UF III MINI PEN NEEDLES) 31G X 5 MM MISC, 1 Needle by Does not apply route daily.   Liraglutide  -Weight Management 18 MG/3ML SOPN, Inject 0.6 mg into the skin daily for 7 days, THEN 1.2 mg daily for 7 days, THEN 1.8 mg daily for 14 days.   OVER THE COUNTER MEDICATION, Take 1 capsule by mouth daily. BARIATRIC VITAMIN   tizanidine  (ZANAFLEX ) 2 MG capsule, Take 2 mg by mouth as needed for muscle spasms.   Vitamin D , Ergocalciferol , (DRISDOL ) 1.25 MG (50000 UNIT) CAPS capsule, Take 1 capsule by mouth once a week   Reviewed prior external information including notes and imaging from  primary care provider As well as notes that were available from care everywhere and other healthcare systems.  Past medical history, social, surgical and family history all reviewed in electronic medical record.  No pertanent information unless stated regarding to the chief complaint.   Review of Systems:  No headache, visual changes, nausea, vomiting, diarrhea, constipation, dizziness, abdominal pain, skin rash, fevers, chills, night sweats, weight loss, swollen lymph nodes, body aches, joint swelling, chest pain, shortness of breath, mood changes. POSITIVE muscle aches  Objective  There were no vitals taken for this visit.   General: No apparent distress alert and oriented x3 mood and affect normal, dressed appropriately.  HEENT: Pupils equal, extraocular movements intact   Respiratory: [name] speak in full sentences and does not appear short of breath  Cardiovascular: No lower extremity edema, non tender, no erythema      Impression and Recommendations:

## 2024-04-18 ENCOUNTER — Ambulatory Visit: Attending: Cardiology

## 2024-04-18 ENCOUNTER — Other Ambulatory Visit (HOSPITAL_COMMUNITY): Payer: Self-pay | Admitting: Family Medicine

## 2024-04-18 ENCOUNTER — Telehealth (HOSPITAL_COMMUNITY): Payer: Self-pay

## 2024-04-18 ENCOUNTER — Telehealth: Payer: Self-pay

## 2024-04-18 DIAGNOSIS — I5022 Chronic systolic (congestive) heart failure: Secondary | ICD-10-CM

## 2024-04-18 DIAGNOSIS — Z95810 Presence of automatic (implantable) cardiac defibrillator: Secondary | ICD-10-CM

## 2024-04-18 NOTE — Telephone Encounter (Signed)
 Pt left message returning previous phone call from today.  Called pt back in which pt stated she would go ahead and graduate from cardiac rehab tomorrow.

## 2024-04-18 NOTE — Telephone Encounter (Signed)
 Attempted to call pt regarding upcoming cardiac rehab graduation date of 04/19/24.

## 2024-04-18 NOTE — Telephone Encounter (Signed)
 Remote ICM transmission received.  Attempted call to patient regarding ICM remote transmission.  Left detailed message per DPR with ICM phone number to return call for any questions, concerns or fluid symptoms.

## 2024-04-18 NOTE — Progress Notes (Signed)
 EPIC Encounter for ICM Monitoring  Patient Name: [name] is a 48 y.o. female Date: 04/18/2024 Primary Care Physican: [name] LABOR, MD Primary Cardiologist: [name] Electrophysiologist: [name] 12/28/2022 Weight: 218 lbs       02/03/2023 Weight: 200 lbs 07/28/2023 Weight: 209 lbs 08/05/2023 Office Weight: 215 lbs 09/27/2023 Weight: 217 lbs 01/25/2024 Office Weight: 210 lbs 03/06/2024 Office Weight: 202 lbs 03/28/2024 Office Weight: 204 lbs   Attempted call to patient and unable to reach.  Left detailed message per DPR regarding transmission.  Transmission results reviewed.    Since 04/13/2024 ICM Remote Transmission: [facility] HF Index dropped from 21 to 7 suggesting fluid levels returned to normal.   Prescribed: Torsemide  20 mg Take 1 tablet (20 mg total) by mouth as needed (for weight gain and swelling).  Per 03/28/2024 HF clinic note she takes every other day   Labs: 04/05/2024 Creatinine 1.12, BUN 12, Potassium 3.6, Sodium 137, GFR 60 03/08/2024 Creatinine 0.95, BUN 18, Potassium 3.9, Sodium 136, GFR >60  03/01/2024 Creatinine 0.97, BUN 23, Potassium 4.3, Sodium 139, GFR >60  02/26/2024 Creatinine 1.72, BUN 42, Potassium 2.9, Sodium 134, GFR 36  02/23/2024 Creatinine 1.16, BUN 15, Potassium 4.1, Sodium 135, GFR 58 01/31/2024 Creatinine 0.85, BUN 15, Potassium 137, Sodium 137, GFR >60 11/29/2023 Creatinine 0.70, BUN 8,   Potassium 3.9, Sodium 145, GFR >90 11/15/2023 Creatinine 0.83, BUN 8,   Potassium 4.3, Sodium 141, GFR >60 A complete set of results can be found in Results Review.   Recommendations:  Left voice mail with ICM number and encouraged to call if experiencing any fluid symptoms.      Follow-up plan: ICM clinic phone appointment 05/29/2024.  91 day device clinic remote transmission 06/21/2024.              EP/Cardiology next office visit:  05/04/2024 with Dr [name].  Recall 03/16/2025 with Dr [name].          Copy of ICM check sent to Dr. Inocencio.    Remote Monitoring Medically Necessary for Heart Failure Management.  3 Month [facility] Heart Failure Index:    8 Day Data Trend:          Megan GORMAN Garner, RN 04/18/2024 1:38 PM

## 2024-04-19 ENCOUNTER — Encounter (HOSPITAL_COMMUNITY)
Admission: RE | Admit: 2024-04-19 | Discharge: 2024-04-19 | Disposition: A | Discharge status: Home / Self Care | Source: Ambulatory Visit | Attending: Internal Medicine | Admitting: Internal Medicine

## 2024-04-19 VITALS — Ht 65.0 in | Wt 205.0 lb

## 2024-04-19 DIAGNOSIS — I5042 Chronic combined systolic (congestive) and diastolic (congestive) heart failure: Secondary | ICD-10-CM | POA: Diagnosis not present

## 2024-04-19 NOTE — Progress Notes (Signed)
 Discharge Progress Report  Patient Details  Name: [name] MRN: [id number] Date of Birth: 1975/06/28 Referring Provider:   Flowsheet Row INTENSIVE CARDIAC REHAB ORIENT from 01/03/2024 in [facility]  Referring Provider [name], MD     Number of Visits: 47  Reason for Discharge:  Patient reached a stable level of exercise. Patient independent in their exercise. Patient has met program and personal goals.  Smoking History:  Social History   Tobacco Use  Smoking Status Never   Passive exposure: Never  Smokeless Tobacco Never    Diagnosis:  Heart failure, systolic and diastolic, chronic (HCC)  ADL [facility]:   Initial Exercise Prescription:  Initial Exercise Prescription - 01/03/24 1200       Date of Initial Exercise RX and Referring Provider   Date 01/03/24    Referring Provider [name], MD    Expected Discharge Date 03/29/24      NuStep   Level 1    SPM 70    Minutes 15    METs 2      Track   Laps 14    Minutes 15    METs 2      Prescription Details   Frequency (times per week) 3    Duration Progress to 30 minutes of continuous aerobic without signs/symptoms of physical distress      Intensity   THRR 40-80% of Max Heartrate 68-137    Ratings of Perceived Exertion 11-13    Perceived Dyspnea 0-4      Progression   Progression Continue to progress workloads to maintain intensity without signs/symptoms of physical distress.      Resistance Training   Training Prescription Yes    Weight 2    Reps 10-15          Discharge Exercise Prescription (Final Exercise Prescription Changes):  Exercise Prescription Changes - 04/05/24 1500       Response to Exercise   Blood Pressure (Admit) 122/52    Blood Pressure (Exercise) 126/62    Blood Pressure (Exit) 106/64    Heart Rate (Admit) 90 bpm    Heart Rate (Exercise) 112 bpm    Heart Rate (Exit) 1.33 bpm    Rating of Perceived  Exertion (Exercise) 14    Symptoms 8/10 left knee pain    Comments Reviewed METs and goals    Duration Progress to 30 minutes of  aerobic without signs/symptoms of physical distress    Intensity THRR unchanged      Progression   Progression Continue to progress workloads to maintain intensity without signs/symptoms of physical distress.    Average METs 3      Resistance Training   Training Prescription No    Weight No wts on Wednesdays      Interval Training   Interval Training No      NuStep   Level 4    SPM 91    Minutes 15    METs 3      Track   Laps 12    Minutes 9    METs 2.91          Functional Capacity:  6 Minute Walk     Row Name 01/03/24 1227         6 Minute Walk   Phase Initial     Distance 1265 feet     Walk Time 6 minutes     # of Rest Breaks 0  MPH 2.4     METS 3.76     RPE 12     Perceived Dyspnea  0     VO2 Peak 13.17     Symptoms No     Resting HR 86 bpm     Resting BP 132/88     Resting Oxygen  Saturation  98 %     Exercise Oxygen  Saturation  during 6 min walk 98 %     Max Ex. HR 118 bpm     Max Ex. BP 144/102  pt did not take meds due to appt prior     2 Minute Post BP 134/92        Psychological, QOL, Others - Outcomes: PHQ 2/9:    04/19/2024    1:30 PM 03/22/2024   12:58 PM 02/25/2024    3:18 PM 01/03/2024   12:21 PM 01/03/2024    8:07 AM  Depression screen PHQ 2/9  Decreased Interest 3 1 0 2 2  Down, Depressed, Hopeless 3 1 0 2 2  PHQ - 2 Score 6 2 0 4 4  Altered sleeping 3 1 0 2 3  Tired, decreased energy 2 2 0 2 2  Change in appetite 3 0 0 3 2  Feeling bad or failure about yourself  3 1 0 3 2  Trouble concentrating 3 3 0 2 3  Moving slowly or fidgety/restless 0 0 0 2 1  Suicidal thoughts 0 0 0 1 0  PHQ-9 Score 20 9  0  19  17   Difficult doing work/chores Very difficult Not difficult at all Not difficult at all Somewhat difficult      Data saved with a previous flowsheet row definition    Quality of Life:   Quality of Life - 01/03/24 1235       Quality of Life   Select Quality of Life      Quality of Life Scores   Health/Function Pre 17.93 %    Socioeconomic Pre 18.93 %    Psych/Spiritual Pre 14.71 %    Family Pre 28.8 %    GLOBAL Pre 19.07 %          Personal Goals: Goals established at orientation with interventions provided to work toward goal.  Personal Goals and Risk Factors at Admission - 01/03/24 1429       Core Components/Risk Factors/Patient Goals on Admission    Weight Management Yes;Obesity;Weight Loss    Intervention Weight Management: Develop a combined nutrition and exercise program designed to reach desired caloric intake, while maintaining appropriate intake of nutrient and fiber, sodium and fats, and appropriate energy expenditure required for the weight goal.;Weight Management: Provide education and appropriate resources to help participant work on and attain dietary goals.;Weight Management/Obesity: Establish reasonable short term and long term weight goals.;Obesity: Provide education and appropriate resources to help participant work on and attain dietary goals.    Expected Outcomes Short Term: Continue to assess and modify interventions until short term weight is achieved;Long Term: Adherence to nutrition and physical activity/exercise program aimed toward attainment of established weight goal;Weight Loss: Understanding of general recommendations for a balanced deficit meal plan, which promotes 1-2 lb weight loss per week and includes a negative energy balance of [id number] kcal/d;Understanding of distribution of calorie intake throughout the day with the consumption of 4-5 meals/snacks;Understanding recommendations for meals to include 15-35% energy as protein, 25-35% energy from fat, 35-60% energy from carbohydrates, less than 200mg  of dietary cholesterol, 20-35 gm of total fiber daily  Diabetes Yes    Intervention Provide education about signs/symptoms and action to  take for hypo/hyperglycemia.;Provide education about proper nutrition, including hydration, and aerobic/resistive exercise prescription along with prescribed medications to achieve blood glucose in normal ranges: Fasting glucose 65-99 mg/dL    Expected Outcomes Short Term: Participant verbalizes understanding of the signs/symptoms and immediate care of hyper/hypoglycemia, proper foot care and importance of medication, aerobic/resistive exercise and nutrition plan for blood glucose control.;Long Term: Attainment of HbA1C < 7%.    Heart Failure Yes    Intervention Provide a combined exercise and nutrition program that is supplemented with education, support and counseling about heart failure. Directed toward relieving symptoms such as shortness of breath, decreased exercise tolerance, and extremity edema.    Expected Outcomes Improve functional capacity of life;Short term: Attendance in program 2-3 days a week with increased exercise capacity. Reported lower sodium intake. Reported increased fruit and vegetable intake. Reports medication compliance.;Short term: Daily weights obtained and reported for increase. Utilizing diuretic protocols set by physician.;Long term: Adoption of self-care skills and reduction of barriers for early signs and symptoms recognition and intervention leading to self-care maintenance.    Hypertension Yes    Intervention Provide education on lifestyle modifcations including regular physical activity/exercise, weight management, moderate sodium restriction and increased consumption of fresh fruit, vegetables, and low fat dairy, alcohol moderation, and smoking cessation.;Monitor prescription use compliance.    Expected Outcomes Short Term: Continued assessment and intervention until BP is < 140/22mm HG in hypertensive participants. < 130/58mm HG in hypertensive participants with diabetes, heart failure or chronic kidney disease.;Long Term: Maintenance of blood pressure at goal levels.     Lipids Yes    Intervention Provide education and support for participant on nutrition & aerobic/resistive exercise along with prescribed medications to achieve LDL 70mg , HDL >40mg .    Expected Outcomes Short Term: Participant states understanding of desired cholesterol values and is compliant with medications prescribed. Participant is following exercise prescription and nutrition guidelines.;Long Term: Cholesterol controlled with medications as prescribed, with individualized exercise RX and with personalized nutrition plan. Value goals: LDL < 70mg , HDL > 40 mg.    Stress Yes    Intervention Offer individual and/or small group education and counseling on adjustment to heart disease, stress management and health-related lifestyle change. Teach and support self-help strategies.;Refer participants experiencing significant psychosocial distress to appropriate mental health specialists for further evaluation and treatment. When possible, include family members and significant others in education/counseling sessions.    Expected Outcomes Short Term: Participant demonstrates changes in health-related behavior, relaxation and other stress management skills, ability to obtain effective social support, and compliance with psychotropic medications if prescribed.;Long Term: Emotional wellbeing is indicated by absence of clinically significant psychosocial distress or social isolation.           Personal Goals Discharge:  Goals and Risk Factor Review     Row Name 01/31/24 1414 02/23/24 1652 03/24/24 1438         Core Components/Risk Factors/Patient Goals Review   Personal Goals Review Weight Management/Obesity;Diabetes;Heart Failure;Hypertension;Lipids;Stress Weight Management/Obesity;Diabetes;Heart Failure;Hypertension;Lipids;Stress Weight Management/Obesity;Diabetes;Heart Failure;Hypertension;Lipids;Stress     Review [name] is doing well with exercise at cardiac rehab. Vital signs have been stable.  [name] has increased her met levels. [name] is doing okay with exercise at cardiac rehab, but her progress is hindered by chronic left knee pain. VSS. [name] has maintained her met levels for the last 30 days. [name] is doing okay with exercise at cardiac rehab, but her progress is hindered by chronic  left knee pain. VSS. [name] has continued to maintain her met levels for the last 30 days.     Expected Outcomes [name] will continue to participate in cardiac rehab for exercise, nutrtion and lifestyle modifications. [name] will continue to participate in cardiac rehab for exercise, nutrtion and lifestyle modifications. [name] will continue to participate in cardiac rehab for exercise, nutrition and lifestyle modifications.        Exercise Goals and Review:  Exercise Goals     Row Name 01/03/24 1230             Exercise Goals   Increase Physical Activity Yes       Intervention Develop an individualized exercise prescription for aerobic and resistive training based on initial evaluation findings, risk stratification, comorbidities and participant's personal goals.;Provide advice, education, support and counseling about physical activity/exercise needs.       Expected Outcomes Long Term: Exercising regularly at least 3-5 days a week.;Short Term: Attend rehab on a regular basis to increase amount of physical activity.;Long Term: Add in home exercise to make exercise part of routine and to increase amount of physical activity.       Increase Strength and Stamina Yes       Intervention Provide advice, education, support and counseling about physical activity/exercise needs.;Develop an individualized exercise prescription for aerobic and resistive training based on initial evaluation findings, risk stratification, comorbidities and participant's personal goals.       Expected Outcomes Short Term: Increase workloads from initial exercise prescription for resistance, speed, and METs.;Long Term: Improve  cardiorespiratory fitness, muscular endurance and strength as measured by increased METs and functional capacity ( );Short Term: Perform resistance training exercises routinely during rehab and add in resistance training at home       Able to understand and use rate of perceived exertion (RPE) scale Yes       Intervention Provide education and explanation on how to use RPE scale       Expected Outcomes Short Term: Able to use RPE daily in rehab to express subjective intensity level;Long Term:  Able to use RPE to guide intensity level when exercising independently       Knowledge and understanding of Target Heart Rate Range (THRR) Yes       Intervention Provide education and explanation of THRR including how the numbers were predicted and where they are located for reference       Expected Outcomes Long Term: Able to use THRR to govern intensity when exercising independently;Short Term: Able to state/look up THRR;Short Term: Able to use daily as guideline for intensity in rehab       Understanding of Exercise Prescription Yes       Intervention Provide education, explanation, and written materials on patient's individual exercise prescription       Expected Outcomes Short Term: Able to explain program exercise prescription;Long Term: Able to explain home exercise prescription to exercise independently          Exercise Goals Re-Evaluation:  Exercise Goals Re-Evaluation     Row Name 01/10/24 1635 02/04/24 1438 03/08/24 1500 04/05/24 1500       Exercise Goal Re-Evaluation   Exercise Goals Review Increase Physical Activity;Understanding of Exercise Prescription;Increase Strength and Stamina;Knowledge and understanding of Target Heart Rate Range (THRR);Able to understand and use rate of perceived exertion (RPE) scale Increase Physical Activity;Understanding of Exercise Prescription;Increase Strength and Stamina;Knowledge and understanding of Target Heart Rate Range (THRR);Able to understand and use  rate of perceived exertion (RPE) scale  Increase Physical Activity;Understanding of Exercise Prescription;Increase Strength and Stamina;Knowledge and understanding of Target Heart Rate Range (THRR);Able to understand and use rate of perceived exertion (RPE) scale Increase Physical Activity;Understanding of Exercise Prescription;Increase Strength and Stamina;Knowledge and understanding of Target Heart Rate Range (THRR);Able to understand and use rate of perceived exertion (RPE) scale    Comments Pt's first day in the [facility] program, Pt understands the exercise Rx, RPE scale and THRR. Reviewed METs and goals. Pt voices progress on her goal of imrpoved strength and stamina. Pt voices that she can go up the stairs more easily, has more energy and is not taking naps in the afternoon anymore. Reviewed METs and goals. Pt continues to voice progress on her goal of improved strength and stamina. Pt continues to voice that she can do the stairs more easily, has more energy and is not taking naps in the afternoon anymore. Pt has also made progress on her goal of a better midset. Pt has also made progress on her goal of weight loss and lost 8.2 lbs to date. Reviewed METs and goals. Pt no feeling great about her progress on her goal of improved strength and stamina. Pt was out sick and had a death in the family. Pt had conversation with RN and is agreeable to counseling to help with some of these issues. Continue to encourage patient to progress as she feels able.    Expected Outcomes Will continue to monitor the patient and progress exercise workloads as tolerated. Will continue to monitor the patient and progress exercise workloads as tolerated. Will continue to monitor the patient and progress exercise workloads as tolerated. Will continue to monitor the patient and progress exercise workloads as tolerated.       Nutrition & Weight - Outcomes:  Pre Biometrics - 01/03/24 1220       Pre Biometrics   Waist  Circumference 43 inches    Hip Circumference 49 inches    Waist to Hip Ratio 0.88 %    Triceps Skinfold 48 mm    % Body Fat 47.7 %    Grip Strength 28 kg    Flexibility 15 in    Single Leg Stand 30 seconds           Nutrition:  Nutrition Therapy & Goals - 01/10/24 1410       Nutrition Therapy   Diet Heart Healthy Diet      Personal Nutrition Goals   Nutrition Goal Patient to identify strategies for reducing cardiovascular risk by attending the [employer] education and nutrition series weekly.    Personal Goal #2 Patient to improve diet quality by using the plate method as a guide for meal planning to include lean protein/plant protein, fruits, vegetables, whole grains, nonfat dairy as part of a well-balanced diet.    Comments [name] has medical history of OSA, HTN, NICM, CHF. She is s/p Sleeve Gastrectomy at 260#; her lowest weight following surgery was 180# (30% total body weight loss). She is motivated to lose weight and recently started working with [facility]; per documenation on 01/03/24, they recommended an 1000kcals diet. Patient will benefit from participation in intensive cardiac rehab for nutrition education, exercise, and lifestyle modification.      Intervention Plan   Intervention Prescribe, educate and counsel regarding individualized specific dietary modifications aiming towards targeted core components such as weight, hypertension, lipid management, diabetes, heart failure and other comorbidities.;Nutrition handout(s) given to patient.    Expected Outcomes Short Term Goal: Understand basic  principles of dietary content, such as calories, fat, sodium, cholesterol and nutrients.;Long Term Goal: Adherence to prescribed nutrition plan.          Nutrition Discharge:  Nutrition Assessments - 01/13/24 1109       Rate Your Plate Scores   Pre Score 61          Education Questionnaire Score:  Knowledge Questionnaire Score - 01/03/24 1429        Knowledge Questionnaire Score   Pre Score 24/24         Pt graduates from  Intensive/Traditional cardiac rehab program today with completion of 46 exercise and education sessions. Pt maintained good attendance and progressed nicely during their participation in rehab as evidenced by increased MET level.   Medication list reconciled. Repeat  PHQ score-20.  Pt has an appointment with a behavioral health counselor on 04/25/24, is on anti-depressants, denies SI/HI and denies having any sort of plan.  Reinforced and reeducated patient to continue practicing self-care activities and to continue reaching out for help when feeling depressed.  Pt has made significant lifestyle changes and should be commended for her success.  [name] achieved her goals during cardiac rehab.   Pt plans to continue exercise at home and swimming at the [facility].  Goals reviewed with patient; copy given to patient.

## 2024-04-23 ENCOUNTER — Encounter: Payer: Self-pay | Admitting: Family Medicine

## 2024-04-24 ENCOUNTER — Ambulatory Visit (INDEPENDENT_AMBULATORY_CARE_PROVIDER_SITE_OTHER): Payer: Self-pay | Admitting: Internal Medicine

## 2024-04-24 ENCOUNTER — Ambulatory Visit: Admitting: Family Medicine

## 2024-04-25 ENCOUNTER — Ambulatory Visit: Admitting: Licensed Clinical Social Worker

## 2024-04-25 DIAGNOSIS — F331 Major depressive disorder, recurrent, moderate: Secondary | ICD-10-CM

## 2024-04-25 DIAGNOSIS — F411 Generalized anxiety disorder: Secondary | ICD-10-CM | POA: Diagnosis not present
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
## 2024-05-03 ENCOUNTER — Ambulatory Visit: Admitting: Licensed Clinical Social Worker

## 2024-05-03 DIAGNOSIS — F411 Generalized anxiety disorder: Secondary | ICD-10-CM

## 2024-05-03 DIAGNOSIS — F331 Major depressive disorder, recurrent, moderate: Secondary | ICD-10-CM

## 2024-05-03 NOTE — Progress Notes (Signed)
 Tawni Louder, [facility] Counselor/Therapist Progress Note  Patient ID: [name], MRN: [id number]   Date: 05/03/24  Time Spent: 3:03  pm - 3:51 pm : 48 Minutes  Treatment Type: Individual Therapy.  Reported Symptoms: Pt engaged, fatigued but cooperative. Mood low but stable. No SI/HI. Demonstrates insight and readiness to explore appropriate level of care for trauma work.  Mental Status Exam: Appearance:  Well Groomed     Behavior: Appropriate and Sharing  Motor: Normal  Speech/Language:  Normal Rate  Affect: Appropriate  Mood: anxious and sad  Thought process: circumstantial  Thought content:   WNL  Sensory/Perceptual disturbances:   WNL  Orientation: oriented to person, place, and time/date  Attention: Good  Concentration: Good  Memory: WNL  Fund of knowledge:  Good  Insight:   Good  Judgment:  Good  Impulse Control: Good   Risk Assessment: Danger to Self:  No Self-injurious Behavior: No Danger to Others: No Duty to Warn:no Physical Aggression / Violence:No  Access to Firearms a concern: No  Gang Involvement:No   Subjective:   [name] participated from home, via video and consented to treatment. Therapist participated from home office. I discussed the limitations of evaluation and management by telemedicine and the availability of in person appointments. The patient expressed understanding and agreed to proceed. Aysiah reviewed the events of the past week. Pt reports ongoing struggles with MDD symptoms. During goal-setting discussion, pt acknowledged deeper unresolved trauma that has not been addressed in prior treatment. Pt expressed understanding of the connection between past trauma and current functioning. She states preference for working with a Black female therapist for trauma processing and is open to returning to current therapist afterward. Pt requested a shortened session due to a scheduled virtual medical appointment.      We reviewed numerous treatment approaches including CBT and Solution focused therapy. Psych-education regarding the Natahlia's diagnosis of Moderate episode of recurrent major depressive disorder (HCC)  Generalized anxiety disorder was provided during the session. We discussed Valborg Friar Stephen's goals treatment goals which include one additional session planned to finalize decision of referral for Trauma Informed Therapist. [name] provided verbal approval of the treatment plan.   Interventions: Psycho-education & Goal Setting.   Diagnosis:  Moderate episode of recurrent major depressive disorder (HCC)  Generalized anxiety disorder  Psychiatric Treatment: No , N/A   Treatment Plan:  Client Abilities/Strengths Brexley is motivated and open to sessions.    Support System: Family  Client Treatment Preferences Hybrid  Treatment Level Biweekly  Symptoms  Sad/sleep disturbances/rumination/freeze response   (Status: declined) Depressed/tearful/anxious   (Status: declined)  Goals:   [place] Complex trauma contributing to current depressive symptoms; pt may benefit from trauma-focused therapy with a clinician who aligns with her cultural and personal preferences. Appropriateness of referral under consideration. Pt receptive and motivated.One additional session planned to finalize decision on referral. Therapist to prepare referral options for trauma-focused Black female clinicians. Discuss transition plan and treatment course next session. Next session scheduled virtually in two weeks.  Treatment plan signed and available on s-drive:  Yes    Target Date: 05/15/24 Frequency: Biweekly  Progress: 0 Modality: individual    Therapist will provide referrals for additional resources as appropriate.  Therapist will provide psycho-education regarding Azia's diagnosis and corresponding treatment approaches and interventions. Licensed Clinical Mental Health Counselor, Tawni Louder, [facility] will support the patient's ability to achieve the goals identified. will employ CBT, BA, Problem-solving, Solution Focused, Mindfulness,  coping skills, &  other evidenced-based practices will be used to promote progress towards healthy functioning to help manage decrease symptoms associated with her diagnosis.   Reduce overall level, frequency, and intensity of the feelings of depression, anxiety and panic evidenced by decreased fear and anxiety fueling depression from complex trauma from 6 to 7 days/week to 0 to 1 days/week per client report for at least 3 consecutive months. Verbally express understanding of the relationship between feelings of depression, anxiety and their impact on thinking patterns and behaviors. Verbalize an understanding of the role that distorted thinking plays in creating fears, excessive worry, and ruminations.    [name] participated in the creation of the treatment plan)    Tawni Louder, LCMHC

## 2024-05-04 ENCOUNTER — Ambulatory Visit (HOSPITAL_COMMUNITY)
Admission: RE | Admit: 2024-05-04 | Discharge: 2024-05-04 | Disposition: A | Discharge status: Home / Self Care | Source: Ambulatory Visit | Attending: Internal Medicine | Admitting: Internal Medicine

## 2024-05-04 VITALS — BP 100/60 | HR 107 | Wt 197.0 lb

## 2024-05-04 DIAGNOSIS — I5022 Chronic systolic (congestive) heart failure: Secondary | ICD-10-CM | POA: Diagnosis not present

## 2024-05-04 DIAGNOSIS — I493 Ventricular premature depolarization: Secondary | ICD-10-CM | POA: Diagnosis not present

## 2024-05-04 DIAGNOSIS — G4733 Obstructive sleep apnea (adult) (pediatric): Secondary | ICD-10-CM | POA: Diagnosis not present

## 2024-05-04 DIAGNOSIS — Z95810 Presence of automatic (implantable) cardiac defibrillator: Secondary | ICD-10-CM | POA: Diagnosis not present

## 2024-05-04 DIAGNOSIS — Z86718 Personal history of other venous thrombosis and embolism: Secondary | ICD-10-CM | POA: Diagnosis not present

## 2024-05-04 DIAGNOSIS — I10 Essential (primary) hypertension: Secondary | ICD-10-CM | POA: Diagnosis not present

## 2024-05-04 DIAGNOSIS — I110 Hypertensive heart disease with heart failure: Secondary | ICD-10-CM | POA: Diagnosis not present

## 2024-05-04 LAB — BASIC METABOLIC PANEL WITH GFR
Anion gap: 11 (ref 5–15)
BUN: 13 mg/dL (ref 6–20)
CO2: 34 mmol/L — ABNORMAL HIGH (ref 22–32)
Calcium: 9.3 mg/dL (ref 8.9–10.3)
Chloride: 89 mmol/L — ABNORMAL LOW (ref 98–111)
Creatinine, Ser: 1.07 mg/dL — ABNORMAL HIGH (ref 0.44–1.00)
GFR, Estimated: >60 mL/min (ref >60)
Glucose, Bld: 96 mg/dL (ref 70–99)
Potassium: 3.7 mmol/L (ref 3.5–5.1)
Sodium: 134 mmol/L — ABNORMAL LOW (ref 135–145)

## 2024-05-04 LAB — CBC
HCT: 50.3 % — ABNORMAL HIGH (ref 36.0–46.0)
Hemoglobin: 16.6 g/dL — ABNORMAL HIGH (ref 12.0–15.0)
MCH: 30 pg (ref 26.0–34.0)
MCHC: 33 g/dL (ref 30.0–36.0)
MCV: 90.8 fL (ref 80.0–100.0)
Platelets: 219 K/uL (ref 150–400)
RBC: 5.54 MIL/uL — ABNORMAL HIGH (ref 3.87–5.11)
RDW: 12.9 % (ref 11.5–15.5)
WBC: 4.8 K/uL (ref 4.0–10.5)
nRBC: 0 % (ref 0.0–0.2)

## 2024-05-04 LAB — TSH: TSH: 1.785 u[IU]/mL (ref 0.350–4.500)

## 2024-05-04 LAB — BRAIN NATRIURETIC PEPTIDE: B Natriuretic Peptide: 13 pg/mL (ref 0.0–100.0)

## 2024-05-04 NOTE — Patient Instructions (Signed)
 Medication Changes:  No Changes In Medications at this time.   Lab Work:  Labs done today, your results will be available in MyChart, we will contact you for abnormal readings.  Follow-Up in: 6 MONTHS PLEASE CALL OUR OFFICE AROUND APRIL 2026 TO GET SCHEDULED FOR YOUR APPOINTMENT. PHONE NUMBER IS [phone] OPTION 2   At the [facility], you and your health needs are our priority. We have a designated team specialized in the treatment of Heart Failure. This Care Team includes your primary Heart Failure Specialized Cardiologist (physician), Advanced Practice Providers (APPs- Physician Assistants and Nurse Practitioners), and Pharmacist who all work together to provide you with the care you need, when you need it.   You may see any of the following providers on your designated Care Team at your next follow up:  Dr. [name] Dr. Ezra Shuck Dr. Odis Brownie Greig Mosses, NP Caffie Shed, [place] [facility] [place], [place] Beckey Coe, NP Jordan Lee, NP Tinnie Redman, PharmD   Please be sure to bring in all your medications bottles to every appointment.   Need to Contact Us :  If you have any questions or concerns before your next appointment please send us  a message through [place] or call our office at [phone].    TO LEAVE A MESSAGE FOR THE NURSE SELECT OPTION 2, PLEASE LEAVE A MESSAGE INCLUDING: YOUR NAME DATE OF BIRTH CALL BACK NUMBER REASON FOR CALL**this is important as we prioritize the call backs  YOU WILL RECEIVE A CALL BACK THE SAME DAY AS LONG AS YOU CALL BEFORE 4:00 PM

## 2024-05-04 NOTE — Progress Notes (Signed)
 Advanced Heart Failure Clinic Note   PCP: Rollene Almarie LABOR, MD Primary Cardiologist: [name], MD (Inactive)  HF Cardiologist: Dr. Cherrie   HPI: [name] is a 48 y.o. 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   Admitted 7/19 with CP Symptoms thought to be related to frequent PVCs. EP consulted and scheduled her for PVC ablation. Echo repeated 11/22/17 and showed improved EF 50-55%. S/p  PVC ablation 12/01/17. Was felt not to be successful.  Was on flecainide  but discontinued due to reduced EF. Placed on amio   Echo 6/20 showed drop in EF back down to 25-30%. Was instructed to f/u in clinic but pt did not. She had outpatient monitor 04/2018 that showed rare PVCs.    Admitted 6/21 with chest pain and shortness of breath. ECHO EF < 20% and normal RV. Had cath with normal cors and preserved cardiac output. Plan was to f/u with EP regarding PVCs. She has remained on AAD therapy w/ amiodarone  + ? blocker therapy.  Admitted to [facility] 4/22 w/ increased dyspnea and volume overload. Also w/ increased PVC burden. Echo showed EF 20-25%, RV normal.  S/p Barostim 6/22.  Zio 8/22: 13% PVCs (unifocal)  Echo 7/23 EF 25-30% RV normal   Zio 14 day (12/23) showed frequent runs of NSVT, 13% PVC burden, concern for LMNA CM.  -> mexilitene increased to 300 bid  Cardiac PET 6/24 no ischemia/infarction. No active myocardial inflammation/ evidence of sarcoidosis. 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 => S/p PVC ablation 7/25 at [facility] with Dr. Sherrye.  CPX 7/25 showed moderate function limitation due to HF and obesity.  Post ablation 4 day Zio (9/25) showed mostly NSR, 7.4% PVC burden  Seen in the ED 02/26/24 with hypotension and near syncope.  Orthostatics were positive. All GDMT was held and given IVF. K was 2.9, aggressively supplemented. She was discharged home.  Today she returns for HF follow up. Feels good. Has finished CR. Staying active. Doing water aerobics and playing pickleball. No CP. Recovering from recent URI. Wears CPAP regularly.   Echo 03/28/24  EF 35%, G1DD, RV mildly reduced.  Cardiac Studies  - CPX 7/25: moderate functional limitation 2/2 obesity and HF; pre exercise spirometry suggestive of mild restriction Peak VO2: 11.2 57% predicted peak VO2  Peak VO2 2021: 17.1 (83% of predicted)  VE/VCO2 slope:  31   - Cardiac PET (6/24):    FDG uptake was not observed. LV perfusion is normal. There is no evidence of ischemia. There is no evidence of infarction.   Coronary calcium was absent on the attenuation correction CT images.   FDG uptake findings are inconsistent with active myocardial inflammation/sarcoidosis.   No gated images or EF due to interference from AICD leads  - Echo (7/23): EF 25-30%  - CPX (7/21) FVC 2.32 (70%)      FEV1 1.99 (74%)        FEV1/FVC 86 (105%)        MVV 110 (105%) Peak VO2: 17.6 (86% predicted peak VO2) - corrects to 28.4 for ibw VE/VCO2 slope:  37    Review of systems complete and found to be negative unless listed in HPI.    Past Medical History:  Diagnosis Date   Acute on chronic systolic (congestive) heart failure (HCC) 04/29/2017   Acute pain of right shoulder 06/16/2017   ADHD    AICD (automatic cardioverter/defibrillator) present    Anginal pain    Anxiety    Arthritis    right shoulder    Asthma    Back pain    CHF (congestive heart failure) (HCC)    Chronic combined systolic and diastolic heart failure (HCC) 03/05/2014   Closed low lateral malleolus fracture 10/23/2013   Constipation    Cystitis 10/21/2017   Depression    Depression with anxiety 01/20/2013   Diabetes mellitus without complication (HCC)    Diverticulosis    Dyspnea    comes and goes  intermittently mostly with exertion    Dysrhythmia    Edema, lower extremity    Essential hypertension    Prev followed by [name]    Fibroid    age 15   Gallstones    Generalized abdominal cramping    History of cardiomyopathy    Hypertension    IBS (irritable bowel syndrome)    ICD (implantable cardioverter-defibrillator), single, 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 CPAP intolerance >> smothering Good tolerance of auto device (optimal pressure 12-13 on download).  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 18 g 0   clonazePAM  (KLONOPIN ) 0.5 MG tablet Take 1 tablet (0.5 mg total) by mouth 2 (two) times daily as needed for anxiety. 30 tablet 1   diclofenac  (VOLTAREN ) 75 MG EC tablet Take 1 tablet by mouth twice daily as needed 60 tablet 0   empagliflozin  (JARDIANCE )  10 MG TABS tablet Take 1 tablet (10 mg total) by mouth daily before breakfast. 30 tablet 5   escitalopram  (LEXAPRO ) 10 MG tablet Take 1 tablet (10 mg total) by mouth daily. 90 tablet 1   Insulin  Pen Needle (B-D UF III MINI PEN NEEDLES) 31G X 5 MM MISC 1 Needle by Does not apply route daily. 30 each 0   ipratropium-albuterol  (DUONEB) 0.5-2.5 (3) MG/3ML SOLN USE 1 AMPULE IN NEBULIZER EVERY 6 HOURS AS NEEDED 360 mL 0   ivabradine  (CORLANOR ) 7.5 MG TABS tablet Take 1 tablet (7.5 mg total) by mouth 2 (two) times daily with a  meal.     Liraglutide  -Weight Management 18 MG/3ML SOPN Inject 0.6 mg into the skin daily for 7 days, THEN 1.2 mg daily for 7 days, THEN 1.8 mg daily for 14 days. 6 mL 0   losartan  (COZAAR ) 25 MG tablet Take 0.5 tablets (12.5 mg total) by mouth at bedtime. 15 tablet 3   metoprolol  succinate (TOPROL  XL) 25 MG 24 hr tablet Take 0.5 tablets (12.5 mg total) by mouth daily. 15 tablet 5   nitroGLYCERIN  (NITROSTAT ) 0.4 MG SL tablet Place 1 tablet (0.4 mg total) under the tongue every 5 (five) minutes as needed for chest pain. 75 tablet 0   OVER THE COUNTER MEDICATION Take 1 capsule by mouth daily. BARIATRIC VITAMIN     spironolactone  (ALDACTONE ) 25 MG tablet Take 1 tablet (25 mg total) by mouth daily. 30 tablet 5   tizanidine  (ZANAFLEX ) 2 MG capsule Take 2 mg by mouth as needed for muscle spasms.     torsemide  (DEMADEX ) 20 MG tablet Take 1 tablet (20 mg total) by mouth every other day. 240 tablet 0   Ubrogepant  (UBRELVY ) 100 MG TABS Take 1 tablet (100 mg total) by mouth daily as needed (migraine). 30 tablet 3   Vitamin D , Ergocalciferol , (DRISDOL ) 1.25 MG (50000 UNIT) CAPS capsule Take 1 capsule by mouth once a week 12 capsule 0   ENTRESTO  24-26 MG Take 1 tablet by mouth 2 (two) times daily. (Patient not taking: Reported on 05/04/2024)     No current facility-administered medications for this encounter.   Allergies  Allergen Reactions   Vancomycin  Other (See Comments)    did something  to my kidneys, PROGRESSED TO KIDNEY FAILURE!!   Aspirin  Other (See Comments)    Wheezing, (Pt states that she just wheezes some when she takes aspirin  by itself but she can take aspirin  in a combination product).   Contrast Media [Iodinated Contrast Media] Other (See Comments)    Multiple CT contrast studies done over 2 weeks caused ARF   Ciprofloxacin Itching and Rash   Cyclobenzaprine  Other (See Comments)    Can not tolerate   Farxiga  [Dapagliflozin] Rash   Sulfa Antibiotics Itching and Rash   Social History   Socioeconomic History   Marital status: Married    Spouse name: Beryl   Number of children: 1   Years of education: Not on file   Highest education level: Associate degree: academic program  Occupational History   Occupation: stay at home mom  Tobacco Use   Smoking status: Never    Passive exposure: Never   Smokeless tobacco: Never  Vaping Use   Vaping status: Never Used  Substance and Sexual Activity   Alcohol use: No   Drug use: No   Sexual activity: Yes    Birth control/protection: Surgical    Comment: BTL, Ablation  Other Topics Concern   Not on file  Social History Narrative   Lives with husband, daughter niece and great nephew   Social Drivers of Health   Tobacco Use: Low Risk (04/13/2024)   Patient History    Smoking Tobacco Use: Never    Smokeless Tobacco Use: Never    Passive Exposure: Never  Financial Resource Strain: Low Risk (04/12/2024)   Overall Financial Resource Strain (CARDIA)    Difficulty of Paying Living Expenses: Not hard at all  Food Insecurity: No Food Insecurity (04/12/2024)   Epic    Worried About Radiation Protection Practitioner of Food in the Last Year: Never true    Ran Out of  Food in the Last Year: Never true  Transportation Needs: No Transportation Needs (04/12/2024)   Epic    Lack of Transportation (Medical): No    Lack of Transportation (Non-Medical): No  Physical Activity: Sufficiently Active (04/12/2024)   Exercise Vital Sign    Days of  Exercise per Week: 4 days    Minutes of Exercise per Session: 40 min  Stress: Stress Concern Present (04/12/2024)   [employer] of Occupational Health - Occupational Stress Questionnaire    Feeling of Stress: Very much  Social Connections: Socially Isolated (04/12/2024)   Social Connection and Isolation Panel    Frequency of Communication with Friends and Family: Never    Frequency of Social Gatherings with Friends and Family: Never    Attends Religious Services: Never    Database Administrator or Organizations: No    Attends Engineer, Structural: Not on file    Marital Status: Married  Catering Manager Violence: Not At Risk (02/25/2024)   Epic    Fear of Current or Ex-Partner: No    Emotionally Abused: No    Physically Abused: No    Sexually Abused: No  Depression (PHQ2-9): High Risk (04/19/2024)   Depression (PHQ2-9)    PHQ-2 Score: 20  Alcohol Screen: Low Risk (02/25/2024)   Alcohol Screen    Last Alcohol Screening Score (AUDIT): 0  Housing: Unknown (04/12/2024)   Epic    Unable to Pay for Housing in the Last Year: No    Number of Times Moved in the Last Year: Not on file    Homeless in the Last Year: No  Utilities: Not At Risk (02/25/2024)   Epic    Threatened with loss of utilities: No  Health Literacy: Adequate Health Literacy (02/25/2024)   B1300 Health Literacy    Frequency of need for help with medical instructions: Never   Family History  Problem Relation Age of Onset   Rheum arthritis Mother    Obesity Mother    Emphysema Maternal Grandmother        smoked   Heart disease Maternal Grandmother 42       MI   Allergies Daughter    Colon cancer Neg Hx     Vitals:   05/04/24 1051  BP: 100/60  Pulse: (!) 107  SpO2: 97%  Weight: 89.4 kg (197 lb)    Wt Readings from Last 3 Encounters:  04/19/24 93 kg (205 lb 0.4 oz)  04/13/24 93 kg (205 lb)  04/12/24 92.1 kg (203 lb)   LMP  (LMP Unknown)   PHYSICAL EXAM: General:  Sitting up No resp  difficulty HEENT: normal Neck: supple. no JVD.  Cor: Regular rate & rhythm. No rubs, gallops or murmurs. Lungs: clear Abdomen: soft, nontender, nondistended.Good bowel sounds. Extremities: no cyanosis, clubbing, rash, edema Neuro: alert & orientedx3, cranial nerves grossly intact. moves all 4 extremities w/o difficulty. Affect pleasant  ECG: Sinus tach 107 barostim artifact Personally reviewed  Device interrogation (personally reviewed): HL score 4, 0.7 -. 0.9 hr/day activity, no VT/AF Mean HR 99 Personally reviewed   ASSESSMENT & PLAN: 1. Chronic systolic HF in setting of TTN CM:   - due to TTN CM, onset 1999.  - S/P [employer] ICD 07/2016 - s/p Barostim 6/22 - LHC 6/17 No CAD - Echo 12/17 EF 20-25% - s/p ICD 3/18.  - Echo 8/18 EF 25-30%  - CPX 12/18 showed moderate limitation due mostly to obesity with some HF component - Echo 7/23 EF 25-30% -  Cardiac PET 6/24: no ischemia/infarction. 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 Suspect progressive course. Saw Dr. Joseph>>Variant is interpreted as variant of unknown clinical significance. Per Dr. Thera, as there are no affected family members to determine if this variant segregates with disease, genetic testing her daughter for this familial TTN variant is unwarranted. - Unclear if PVCs are a result of her CM or contributing to it (or both).  - Echo 03/28/24 showed EF 35%, RV mildly down (echo after PVC suppression). - Much improved after repeat PVC ablation. NYHA I-II - Volume ok  - Continue spiro 25 mg daily. - Continue torsemide  20 mg every other day, may be able to switch to PRN with addition of MRA. - Continue Jardiance  10 mg daily. - Continue Toprol  XL 12.5 mg daily. - Continue Corlanor  7.5 mg bid. - Continue losartan  12.5 mg at bedtime. - ICD  interrogated personally. Looks good but HR is elevated. Will get ECG and labs. Suspect may be volume depleted in setting of recent URI   2. Frequent PVCs/Bigeminy - Holter Monitor 6/19 with 30% PVCs with one primary morphology.  - S/p PVC ablation 11/2017 with Dr Waddell unsuccessful - Off flecainide  with EF down and persistent PVCs.  - Zio Patch 04/2019 > 17% PVCs and >8% couplets.  - Zio Patch 11/28/2019 8.5 % PVCs - Zio 9/22 13% PVCs (unifocal)  - Zio 12/23 showed frequent NSVT, 13% PVC burden (see discussion above) - Cardiac PET 6/24 negative for sarcoid  - Saw Dr. Waddell (EP). Zio showed 25% PVCR burden - s/p PVC ablation 7/25 - Zio 9/25 showed PVC burden down to 7.4% - Now off amio and mexiletine   3. Obesity - s/p gastric sleeve at [facility] 09/20/17  - There is no height or weight on file to calculate BMI. - Working with healthy weight and wellness - Congratulated on weight loss efforts so far - Continue GLP1-RA  4. DM 2 - A1C 5.2 - On SGLT2i - Per PCP  5. OSA - compliant with CPAP   [name], MD  10:52 AM

## 2024-05-08 ENCOUNTER — Ambulatory Visit (INDEPENDENT_AMBULATORY_CARE_PROVIDER_SITE_OTHER): Admitting: Internal Medicine

## 2024-05-08 ENCOUNTER — Encounter (INDEPENDENT_AMBULATORY_CARE_PROVIDER_SITE_OTHER): Payer: Self-pay | Admitting: Internal Medicine

## 2024-05-08 VITALS — BP 110/77 | HR 90 | Temp 98.2°F | Ht 65.0 in | Wt 194.0 lb

## 2024-05-08 DIAGNOSIS — E66812 Obesity, class 2: Secondary | ICD-10-CM

## 2024-05-08 DIAGNOSIS — I110 Hypertensive heart disease with heart failure: Secondary | ICD-10-CM

## 2024-05-08 DIAGNOSIS — G4733 Obstructive sleep apnea (adult) (pediatric): Secondary | ICD-10-CM | POA: Diagnosis not present

## 2024-05-08 DIAGNOSIS — Z6835 Body mass index (BMI) 35.0-35.9, adult: Secondary | ICD-10-CM | POA: Diagnosis not present

## 2024-05-08 DIAGNOSIS — I10 Essential (primary) hypertension: Secondary | ICD-10-CM

## 2024-05-08 DIAGNOSIS — I5042 Chronic combined systolic (congestive) and diastolic (congestive) heart failure: Secondary | ICD-10-CM

## 2024-05-08 DIAGNOSIS — Z7985 Long-term (current) use of injectable non-insulin antidiabetic drugs: Secondary | ICD-10-CM | POA: Diagnosis not present

## 2024-05-08 DIAGNOSIS — E1169 Type 2 diabetes mellitus with other specified complication: Secondary | ICD-10-CM

## 2024-05-08 DIAGNOSIS — E669 Obesity, unspecified: Secondary | ICD-10-CM

## 2024-05-08 MED ORDER — TIRZEPATIDE 2.5 MG/0.5ML ~~LOC~~ SOAJ: 2.5000 mg | SUBCUTANEOUS | 0 refills | Status: DC

## 2024-05-08 NOTE — Assessment & Plan Note (Signed)
 Blood pressure is at goal.  On metoprolol , spironolactone , loop diuretic, Jardiance  and Entresto  without adverse effects.  Current regimen

## 2024-05-08 NOTE — Assessment & Plan Note (Signed)
 Diagnosed with mild obstructive sleep apnea in early July 2025.  Continue current weight management strategy

## 2024-05-08 NOTE — Assessment & Plan Note (Signed)
 She was diagnosed with diabetes back in 2012 after gastric bypass her diabetes went into remission but because of weight regain she is at risk for relapse of her diabetes.  About 50 to 70% of patients relapse by 3 to 5 years strongly tied to weight regain.  So she does benefit from treatment with GLP-1 along with lifestyle changes.  She has also had a good response to a trial of liraglutide  which she has paid for out-of-pocket but ongoing treatment will become cost prohibitive.  I feel that her GLP-1 should be covered through her insurance as it is medically necessary.  She will be prescribed Mounjaro  2.5 mg once a week.

## 2024-05-08 NOTE — Assessment & Plan Note (Signed)
 Weight: decrease of 26 lb (11.8%) over 4 months, 1 week  Start: 12/30/2023 220 lb (99.8 kg)  End: 05/08/2024 194 lb (88 kg)  Since starting program she has lost 26 pounds or 11.8% of total body weight following a reduced calorie nutrition plan and physical activity.  Her specialist as well as PCPs have tried to prescribe GLP-1 for patient in the past this has been denied by her insurance.  She has multiple obesity related comorbidities that increase her risk of future complications.  These include hypertension, nonischemic cardiomyopathy with combined heart failure, OSA, asthma and type 2 diabetes in remission.  Her risk of weight regain is high and has a high risk for relapse of her diabetes.  I feel that patient does benefit from treatment with GLP-1.

## 2024-05-08 NOTE — Assessment & Plan Note (Signed)
°  Combined systolic and diastolic heart failure with reduced ejection fraction (20-25%).  Most recent echocardiogram from November showed the following:  1. Left ventricular ejection fraction, by estimation, is 35%. The left  ventricle has moderately decreased function. The left ventricle  demonstrates global hypokinesis. Left ventricular diastolic parameters are  consistent with Grade I diastolic  dysfunction (impaired relaxation). The average left ventricular global  longitudinal strain is -11.6 %. The global longitudinal strain is  abnormal.   2. Right ventricular systolic function is mildly reduced. The right  ventricular size is normal. There is normal pulmonary artery systolic  pressure. The estimated right ventricular systolic pressure is 18.7 mmHg.   3. Left atrial size was mildly dilated.   4. The mitral valve is normal in structure. Trivial mitral valve  regurgitation. No evidence of mitral stenosis.   5. The aortic valve is tricuspid. Aortic valve regurgitation is not  visualized. No aortic stenosis is present.   6. The inferior vena cava is normal in size with greater than 50%  respiratory variability, suggesting right atrial pressure of 3 mmHg.   She will continue with GBMT

## 2024-05-08 NOTE — Progress Notes (Signed)
 Office: [phone]  /  Fax: [phone]  Weight Summary and Body Composition Analysis (BIA)  Vitals Temp: 98.2 F (36.8 C) BP: 110/77 Pulse Rate: 90 SpO2: 96 %   Anthropometric Measurements Height: 5' 5 (1.651 m) Weight: 194 lb (88 kg) BMI (Calculated): 32.28 Weight at Last Visit: 201 lb Weight Lost Since Last Visit: 7 lb Weight Gained Since Last Visit: 0 lb Starting Weight: 211 lb Total Weight Loss (lbs): 17 lb (7.711 kg)   Body Composition  Body Fat %: 40.3 % Fat Mass (lbs): 78.2 lbs Muscle Mass (lbs): 110 lbs Total Body Water (lbs): 70.6 lbs Visceral Fat Rating : 10    RMR: 1570  Today's Visit #: 6  Starting Date: 01/03/24   Subjective   Chief Complaint: Obesity  Interval History Discussed the use of AI scribe software for clinical note transcription with the patient, who gave verbal consent to proceed.  History of Present Illness [name] is a 48 year old female who presents for medical weight management.  She is following a low-carb calorie reduced meal plan about 85% of the time.  She is exercising 4 days a week 45 minutes doing cardio and strengthening.  She has lost seven pounds since her last visit during the holiday season, after starting liraglutide  (Victoza ) at that time. She experiences early satiety and occasional nausea if she overeats, and reports feeling full quickly when eating. Currently on a 1.8 mg dose of liraglutide , she notes relief from constipation with no episodes since starting the medication. The patient reports less preoccupation with food since starting the medication.  She has a history of type 2 diabetes, currently in remission, diagnosed around 2011 or 2012 when she was at a higher body weight.  She underwent a gastric sleeve procedure, contributing to her diabetes remission. Despite her history, she faces challenges with insurance coverage for GLP-1 medications due to normal A1c levels.  Her family has  benefited from her lifestyle changes, with her husband losing eight pounds by eating the same meals and using her walking pad. Her family has also made lifestyle changes, and her husband has lost weight by eating the same meals and using her walking pad.  She mentions insurance denial for GLP-1 medications, including Mounjaro  and Wegovy , despite her history of diabetes and mild sleep apnea. She is exploring options to manage medication costs.     Challenges affecting patient progress: medical comorbidities and having difficulties with GLP-1 or AOM coverage.    Pharmacotherapy for weight management: She is currently taking liraglutide  1.8 mg daily and paying for medication out-of-pocket.   Assessment and Plan   Treatment Plan For Obesity:  Recommended Dietary Goals  [place] is currently in the action stage of change. As such, her goal is to continue weight management plan. She has agreed to: continue current plan  Behavioral Health and Counseling  We discussed the following behavioral modification strategies today: continue to work on maintaining a reduced calorie state, getting the recommended amount of protein, incorporating whole foods, making healthy choices, staying well hydrated and practicing mindfulness when eating. and increase protein intake, fibrous foods (25 grams per day for women, 30 grams for men) and water to improve satiety and decrease hunger signals. .  Additional education and resources provided today: Handout on traveling and holiday eating strategies  Recommended Physical Activity Goals  Ian has been advised to work up to 150 minutes of moderate intensity aerobic activity a week and strengthening exercises 2-3 times per week  for cardiovascular health, weight loss maintenance and preservation of muscle mass.  She has agreed to :  Increase volume of physical activity to a goal of 240 minutes a week and Combine aerobic and strengthening exercises for efficiency and  improved cardiometabolic health.  Medical Interventions and Pharmacotherapy  We discussed various medication options to help Stasha with her weight loss efforts and we both agreed to : Start Mounjaro  2.5 mg once a week  Associated Conditions Impacted by Obesity Treatment  Assessment & Plan Class 2 severe obesity with serious comorbidity and body mass index (BMI) of 35.0 to 35.9 in adult, unspecified obesity type Weight: decrease of 26 lb (11.8%) over 4 months, 1 week  Start: 12/30/2023 220 lb (99.8 kg)  End: 05/08/2024 194 lb (88 kg)  Since starting program she has lost 26 pounds or 11.8% of total body weight following a reduced calorie nutrition plan and physical activity.  Her specialist as well as PCPs have tried to prescribe GLP-1 for patient in the past this has been denied by her insurance.  She has multiple obesity related comorbidities that increase her risk of future complications.  These include hypertension, nonischemic cardiomyopathy with combined heart failure, OSA, asthma and type 2 diabetes in remission.  Her risk of weight regain is high and has a high risk for relapse of her diabetes.  I feel that patient does benefit from treatment with GLP-1. Type 2 diabetes mellitus in patient with obesity [facility]) She was diagnosed with diabetes back in 2012 after gastric bypass her diabetes went into remission but because of weight regain she is at risk for relapse of her diabetes.  About 50 to 70% of patients relapse by 3 to 5 years strongly tied to weight regain.  So she does benefit from treatment with GLP-1 along with lifestyle changes.  She has also had a good response to a trial of liraglutide  which she has paid for out-of-pocket but ongoing treatment will become cost prohibitive.  I feel that her GLP-1 should be covered through her insurance as it is medically necessary.  She will be prescribed Mounjaro  2.5 mg once a week. Essential hypertension Blood pressure is at goal.  On metoprolol ,  spironolactone , loop diuretic, Jardiance  and Entresto  without adverse effects.  Current regimen  Chronic combined systolic and diastolic heart failure (HCC)  Combined systolic and diastolic heart failure with reduced ejection fraction (20-25%).  Most recent echocardiogram from November showed the following:  1. Left ventricular ejection fraction, by estimation, is 35%. The left  ventricle has moderately decreased function. The left ventricle  demonstrates global hypokinesis. Left ventricular diastolic parameters are  consistent with Grade I diastolic  dysfunction (impaired relaxation). The average left ventricular global  longitudinal strain is -11.6 %. The global longitudinal strain is  abnormal.   2. Right ventricular systolic function is mildly reduced. The right  ventricular size is normal. There is normal pulmonary artery systolic  pressure. The estimated right ventricular systolic pressure is 18.7 mmHg.   3. Left atrial size was mildly dilated.   4. The mitral valve is normal in structure. Trivial mitral valve  regurgitation. No evidence of mitral stenosis.   5. The aortic valve is tricuspid. Aortic valve regurgitation is not  visualized. No aortic stenosis is present.   6. The inferior vena cava is normal in size with greater than 50%  respiratory variability, suggesting right atrial pressure of 3 mmHg.   She will continue with GBMT OSA (obstructive sleep apnea) Diagnosed with mild obstructive  sleep apnea in early July 2025.  Continue current weight management strategy         Objective   Physical Exam:  Blood pressure 110/77, pulse 90, temperature 98.2 F (36.8 C), height 5' 5 (1.651 m), weight 194 lb (88 kg), SpO2 96%. Body mass index is 32.28 kg/m.  General: She is overweight, cooperative, alert, well developed, and in no acute distress. PSYCH: Has normal mood, affect and thought process.   HEENT: EOMI, sclerae are anicteric. Lungs: Normal breathing effort,  no conversational dyspnea. Extremities: No edema.  Neurologic: No gross sensory or motor deficits. No tremors or fasciculations noted.    Diagnostic Data Reviewed:  BMET    Component Value Date/Time   NA 134 (L) 05/04/2024 1144   NA 138 07/23/2023 1322   K 3.7 05/04/2024 1144   CL 89 (L) 05/04/2024 1144   CO2 34 (H) 05/04/2024 1144   GLUCOSE 96 05/04/2024 1144   BUN 13 05/04/2024 1144   BUN 22 07/23/2023 1322   CREATININE 1.07 (H) 05/04/2024 1144   CREATININE 0.90 03/26/2016 1536   CALCIUM 9.3 05/04/2024 1144   GFRNONAA >60 05/04/2024 1144   GFRNONAA >89 01/18/2014 1621   GFRAA >60 02/15/2020 1845   GFRAA >89 01/18/2014 1621   Lab Results  Component Value Date   HGBA1C 5.2 07/16/2023   HGBA1C 5.4 11/09/2015   Lab Results  Component Value Date   INSULIN  11.8 01/03/2024   Lab Results  Component Value Date   TSH 1.785 05/04/2024   CBC    Component Value Date/Time   WBC 4.8 05/04/2024 1144   RBC 5.54 (H) 05/04/2024 1144   HGB 16.6 (H) 05/04/2024 1144   HGB 10.6 (L) 09/07/2019 1159   HCT 50.3 (H) 05/04/2024 1144   HCT 32.6 (L) 08/06/2020 1517   PLT 219 05/04/2024 1144   PLT 278 09/07/2019 1159   MCV 90.8 05/04/2024 1144   MCV 82 09/07/2019 1159   MCH 30.0 05/04/2024 1144   MCHC 33.0 05/04/2024 1144   RDW 12.9 05/04/2024 1144   RDW 14.7 09/07/2019 1159   Iron  Studies    Component Value Date/Time   IRON  44 02/23/2024 1600   TIBC 372 02/23/2024 1600   FERRITIN 68 02/23/2024 1600   IRONPCTSAT 12 02/23/2024 1600   Lipid Panel     Component Value Date/Time   CHOL 164 06/11/2023 1107   TRIG 66.0 06/11/2023 1107   HDL 67.00 06/11/2023 1107   CHOLHDL 2 06/11/2023 1107   VLDL 13.2 06/11/2023 1107   LDLCALC 83 06/11/2023 1107   Hepatic Function Panel     Component Value Date/Time   PROT 6.7 01/21/2023 1523   PROT 7.3 05/15/2021 1032   ALBUMIN  3.8 01/21/2023 1523   ALBUMIN  4.5 05/15/2021 1032   AST 38 01/21/2023 1523   ALT 30 01/21/2023 1523   ALKPHOS  64 01/21/2023 1523   BILITOT 1.1 01/21/2023 1523   BILITOT 0.3 05/15/2021 1032   BILIDIR <0.1 08/26/2020 0201   IBILI 0.6 11/21/2017 2155      Component Value Date/Time   TSH 1.785 05/04/2024 1144   TSH 1.750 07/23/2023 1321   Nutritional Lab Results  Component Value Date   VD25OH 24.1 (L) 01/03/2024   VD25OH 14.88 (L) 06/11/2023   VD25OH 12.60 (L) 07/29/2020    Medications: Outpatient Encounter Medications as of 05/08/2024  Medication Sig Note   albuterol  (VENTOLIN  HFA) 108 (90 Base) MCG/ACT inhaler Inhale 1-2 puffs into the lungs every 6 (six) hours as needed  for wheezing or shortness of breath.    clonazePAM  (KLONOPIN ) 0.5 MG tablet Take 1 tablet (0.5 mg total) by mouth 2 (two) times daily as needed for anxiety.    diclofenac  (VOLTAREN ) 75 MG EC tablet Take 1 tablet by mouth twice daily as needed    empagliflozin  (JARDIANCE ) 10 MG TABS tablet Take 1 tablet (10 mg total) by mouth daily before breakfast.    escitalopram  (LEXAPRO ) 10 MG tablet Take 1 tablet (10 mg total) by mouth daily.    Insulin  Pen Needle (B-D UF III MINI PEN NEEDLES) 31G X 5 MM MISC 1 Needle by Does not apply route daily.    ipratropium-albuterol  (DUONEB) 0.5-2.5 (3) MG/3ML SOLN USE 1 AMPULE IN NEBULIZER EVERY 6 HOURS AS NEEDED    ivabradine  (CORLANOR ) 7.5 MG TABS tablet Take 1 tablet (7.5 mg total) by mouth 2 (two) times daily with a meal.    Liraglutide  -Weight Management 18 MG/3ML SOPN Inject 0.6 mg into the skin daily for 7 days, THEN 1.2 mg daily for 7 days, THEN 1.8 mg daily for 14 days.    losartan  (COZAAR ) 25 MG tablet Take 0.5 tablets (12.5 mg total) by mouth at bedtime.    metoprolol  succinate (TOPROL  XL) 25 MG 24 hr tablet Take 0.5 tablets (12.5 mg total) by mouth daily.    nitroGLYCERIN  (NITROSTAT ) 0.4 MG SL tablet Place 1 tablet (0.4 mg total) under the tongue every 5 (five) minutes as needed for chest pain.    OVER THE COUNTER MEDICATION Take 1 capsule by mouth daily. BARIATRIC VITAMIN     spironolactone  (ALDACTONE ) 25 MG tablet Take 1 tablet (25 mg total) by mouth daily.    tirzepatide  (MOUNJARO ) 2.5 MG/0.5ML Pen Inject 2.5 mg into the skin once a week.    tizanidine  (ZANAFLEX ) 2 MG capsule Take 2 mg by mouth as needed for muscle spasms.    torsemide  (DEMADEX ) 20 MG tablet Take 1 tablet (20 mg total) by mouth every other day.    Ubrogepant  (UBRELVY ) 100 MG TABS Take 1 tablet (100 mg total) by mouth daily as needed (migraine).    Vitamin D , Ergocalciferol , (DRISDOL ) 1.25 MG (50000 UNIT) CAPS capsule Take 1 capsule by mouth once a week 02/23/2024: Takes on Wednesday   [DISCONTINUED] ENTRESTO  24-26 MG Take 1 tablet by mouth 2 (two) times daily. (Patient not taking: Reported on 05/04/2024)    No facility-administered encounter medications on file as of 05/08/2024.     Follow-Up   Return in about 4 weeks (around 06/05/2024) for For Weight Mangement with Dr. Francyne.SABRA She was informed of the importance of frequent follow up visits to maximize her success with intensive lifestyle modifications for her multiple health conditions.  Attestation Statement   Reviewed by clinician on day of visit: allergies, medications, problem list, medical history, surgical history, family history, social history, and previous encounter notes.     Lucas Francyne, MD

## 2024-05-10 ENCOUNTER — Encounter (INDEPENDENT_AMBULATORY_CARE_PROVIDER_SITE_OTHER): Payer: Self-pay | Admitting: Internal Medicine

## 2024-05-11 LAB — OPHTHALMOLOGY REPORT-SCANNED

## 2024-05-12 ENCOUNTER — Encounter (INDEPENDENT_AMBULATORY_CARE_PROVIDER_SITE_OTHER): Payer: Self-pay | Admitting: Internal Medicine

## 2024-05-15 ENCOUNTER — Telehealth (INDEPENDENT_AMBULATORY_CARE_PROVIDER_SITE_OTHER): Payer: Self-pay

## 2024-05-15 ENCOUNTER — Ambulatory Visit (INDEPENDENT_AMBULATORY_CARE_PROVIDER_SITE_OTHER): Admitting: Licensed Clinical Social Worker

## 2024-05-15 ENCOUNTER — Encounter: Payer: Self-pay | Admitting: Licensed Clinical Social Worker

## 2024-05-15 DIAGNOSIS — F331 Major depressive disorder, recurrent, moderate: Secondary | ICD-10-CM

## 2024-05-15 DIAGNOSIS — F411 Generalized anxiety disorder: Secondary | ICD-10-CM

## 2024-05-15 NOTE — Telephone Encounter (Signed)
 PA questions for Mounjaro 2.5  have been answered and all documentation has been included. Waiting on a determination.

## 2024-05-15 NOTE — Telephone Encounter (Signed)
 PA for Mounjaro 2.5 MG has been submitted, awaiting PA questions.

## 2024-05-15 NOTE — Progress Notes (Signed)
 " [name]/Therapist Progress Note  Patient ID: [name], MRN: [id number]    Date: 05/15/2024  Time Spent: 3:02  pm - 3:57 pm : 55 Minutes  Treatment Type: Individual Therapy.  Reported Symptoms: Patient was engaged, insightful, and reflective. Affect appropriate, thought process coherent. Demonstrated increased emotional awareness and openness to cognitive reframing.  Mental Status Exam: Appearance:  Casual     Behavior: Appropriate and Sharing  Motor: Normal  Speech/Language:  Normal Rate  Affect: Appropriate  Mood: normal  Thought process: normal  Thought content:   WNL  Sensory/Perceptual disturbances:   WNL  Orientation: oriented to person, place, and time/date  Attention: Good  Concentration: Good  Memory: WNL  Fund of knowledge:  Good  Insight:   Good  Judgment:  Good  Impulse Control: Good   Risk Assessment: Danger to Self:  No Self-injurious Behavior: No Danger to Others: No Duty to Warn:no Physical Aggression / Violence:No  Access to Firearms a concern: No  Gang Involvement:No   Subjective:   [name] participated from home, via video, and consented to treatment. I discussed the limitations of evaluation and management by telemedicine and the availability of in person appointments. The patient expressed understanding and agreed to proceed.  Therapist participated from home office.  Orli reviewed the events of the past week. Patient reported a noticeable shift in her thinking since beginning therapy. She continues to experience worry about the well-being of loved ones and herself due to her heart condition, while also recognizing the need to retrain her brain following compounded trauma that has contributed to hypervigilance. Patient described physical sensations associated with worry, including gut distress and imagery of barbed wire and broken glass. She shared mixed feelings about others doing things for her and fears  that harm or death of loved ones would be her fault. Patient identified faith as a therapist, music, acknowledging prayer while accepting limits to her control.     Interventions: Mindfulness Meditation  [name]:  Moderate episode of recurrent major depressive disorder [facility])  Psychiatric Treatment: No , N/A  Treatment Plan:  Client Abilities/Strengths [name] is open to sessions.  Generalized anxiety and trauma-related hypervigilance with somatic symptoms. Positive progress noted in insight, cognitive flexibility, and ability to differentiate normal stress from traumatic stress. Motivation present to develop healthier emotional language and regulation strategies. Medication being managed by PCP for depression and anxiety.    Support System: Family and Friends   [employer] Officer of Needs [name] would like to focus on moving away from being hypervigilant and fearful of overall wellbeing and of loved ones.     Treatment Level BiWeekly  Symptoms  Reflective/relieved   (Status: improved) Worried/concerned but more aware and educated    (Status: maintained)  Goals:   AngelaProvided psychoeducation on the bodys alarm system and distinctions between normal and trauma-based stress responses. Introduced reframing language from worried to concerned. Goal development initiated; goal to be finalized in January with a target date of March 2026. Sessions to resume after the holidays.   Target Date: 07/24/24 Frequency: BiWeekly  Progress: 0 Modality: individual    Therapist will provide referrals for additional resources as appropriate.  Therapist will provide psycho-education regarding [name] and corresponding treatment approaches and interventions. Licensed Clinical Mental Health [name], Tawni Louder, [facility] will support the patient's ability to achieve the goals identified. will employ CBT, BA, Problem-solving, Solution  Focused, Mindfulness,  coping skills, & other evidenced-based practices will be  used to promote progress towards healthy functioning to help manage decrease symptoms associated with her [name].   Reduce overall level, frequency, and intensity of the feelings of depression, anxiety and panic evidenced by decreased negative rumination and worry about safety and wellbeing of self and others from 6 to 7 days/week to 0 to 1 days/week per client report for at least 3 consecutive months. Verbally express understanding of the relationship between feelings of depression, anxiety and their impact on thinking patterns and behaviors. Verbalize an understanding of the role that distorted thinking plays in creating fears, excessive worry, and ruminations.  [name] participated in the creation of the treatment plan)   [name], [facility]    "

## 2024-05-17 NOTE — Telephone Encounter (Signed)
 PA for Mounjaro  2.5 has been approved. PA is now complete.     Message from Plan 22-DEC-25:22-DEC-26 Mounjaro  2.5MG /0.5ML [place] SOAJ Quantity:2;

## 2024-05-22 ENCOUNTER — Ambulatory Visit: Admitting: Licensed Clinical Social Worker

## 2024-05-22 ENCOUNTER — Encounter: Payer: Self-pay | Admitting: Licensed Clinical Social Worker

## 2024-05-22 ENCOUNTER — Ambulatory Visit: Admitting: Internal Medicine

## 2024-05-22 ENCOUNTER — Encounter: Payer: Self-pay | Admitting: Internal Medicine

## 2024-05-22 VITALS — BP 120/80 | HR 94 | Temp 98.4°F | Ht 65.0 in | Wt 200.2 lb

## 2024-05-22 DIAGNOSIS — E66811 Obesity, class 1: Secondary | ICD-10-CM

## 2024-05-22 DIAGNOSIS — F331 Major depressive disorder, recurrent, moderate: Secondary | ICD-10-CM | POA: Diagnosis not present

## 2024-05-22 DIAGNOSIS — F411 Generalized anxiety disorder: Secondary | ICD-10-CM

## 2024-05-22 DIAGNOSIS — F410 Panic disorder [episodic paroxysmal anxiety] without agoraphobia: Secondary | ICD-10-CM

## 2024-05-22 MED ORDER — CLONAZEPAM 0.5 MG PO TABS: 0.5000 mg | ORAL_TABLET | Freq: Two times a day (BID) | ORAL | 1 refills | Status: AC | PRN

## 2024-05-22 MED ORDER — PAROXETINE HCL 20 MG PO TABS: 20.0000 mg | ORAL_TABLET | Freq: Every day | ORAL | 1 refills | Status: AC

## 2024-05-22 NOTE — Progress Notes (Signed)
 [facility] Counselor/Therapist Progress Note  Patient ID: [name], MRN: [id number]    Date: 05/22/2024 Time Spent: 4:01  pm - 4:21 pm : 80 Minutes  Treatment Type: Individual Therapy.  Reported Symptoms: Patient engaged and reflective; affect anxious but stable. Thought process coherent with trauma-informed insight emerging. Demonstrates increased awareness of nervous system responses but continues to struggle with regulation. No indication of readiness to safely engage in trauma processing at this time. Medications for depression and anxiety managed by PCP.  Mental Status Exam: Appearance:  Well Groomed     Behavior: Appropriate, Sharing, and Rationalizing  Motor: Normal  Speech/Language:  Normal Rate  Affect: Tearful  Mood: anxious and sad  Thought process: circumstantial  Thought content:   Rumination  Sensory/Perceptual disturbances:   Headache   Orientation: oriented to person, place, and time/date  Attention: Good  Concentration: Good  Memory: WNL  Fund of knowledge:  Good  Insight:   Good  Judgment:  Good  Impulse Control: Fair   Risk Assessment: Danger to Self:  No Self-injurious Behavior: No Danger to Others: No Duty to Warn:no Physical Aggression / Violence:No  Access to Firearms a concern: No  Gang Involvement:No   Subjective:   [name] participated from home, via video, and consented to treatment. I discussed the limitations of evaluation and management by telemedicine and the availability of in person appointments. The patient expressed understanding and agreed to proceed.  Therapist participated from home office.  Jara reviewed the events of the past week. Patient is one month into therapy and reports a noticeable shift in thinking since starting treatment. Continues to experience excessive worry regarding her own health (heart condition) and the safety of loved ones, recognizing hypervigilance related to compounded childhood  trauma. Described physical anxiety responses including gut distress and intrusive imagery. Expressed mixed emotions about receiving help from others and fears of being responsible if harm occurs to loved ones. Identified prayer as supportive coping tools while acknowledging limits of control. Reported past psychiatric care focused solely on medication without trauma processing.     Interventions: Psycho-education/Bibliotherapy and Insight-Oriented  Diagnosis:  Moderate episode of recurrent major depressive disorder  GAD  Psychiatric Treatment: No , N/A HX for med mgmt  Treatment Plan:  Client Abilities/Strengths Cyrena is open to sessions.    Support System: Family and Friends  [employer] Officer of Needs Tyshell would like to o focus on moving away from being hypervigilant and fearful of overall wellbeing and of loved ones.      Treatment Level Biweekly  Symptoms  tearful   (Status: maintained) Lighter/reflective   (Status: improved)  Goals:   Gloriann Patient presents with anxiety and trauma-related symptoms, including hypervigilance and somatic distress. Currently lacks sufficient stabilization for trauma-focused treatment. Progress noted in insight, coping awareness, and cognitive reframing. Continued focus on regulation and stabilization remains clinically indicated prior to referral for trauma-based therapy. Continue therapy focusing on nervous system regulation, coping strategies, self-soothing, and maintenance of daily functioning. Monitor readiness for trauma-focused referral through March. Ongoing psychoeducation provided regarding scope of treatment and future trauma work. Coordinate care awareness with PCP as appropriate. Patient agreeable to plan. 8-Week Treatment Goal (Stabilization & Readiness Phase) Over the next 8 weeks, the patient will improve emotional regulation and nervous system awareness by consistently using learned  coping strategies to reduce anxiety and trauma-related distress, demonstrating increased stability and readiness for future trauma-focused treatment.  Target Date: 07/24/24 Frequency: Biweekly  Progress:  0 Modality: individual    Therapist will provide referrals for additional resources as appropriate.  Therapist will provide psycho-education regarding Tiwanna's diagnosis and corresponding treatment approaches and interventions. Licensed Clinical Mental Health Counselor, Tawni Louder, [facility] will support the patient's ability to achieve the goals identified. will employ CBT, BA, Problem-solving, Solution Focused, Mindfulness,  coping skills, & other evidenced-based practices will be used to promote progress towards healthy functioning to help manage decrease symptoms associated with her diagnosis.   Reduce overall level, frequency, and intensity of the feelings of depression, anxiety and panic evidenced by decreased  negative rumination and worry about safety and wellbeing of self and others  from 6 to 7 days/week to 0 to 1 days/week per client report for at least 3 consecutive months. Verbally express understanding of the relationship between feelings of depression, anxiety and their impact on thinking patterns and behaviors. Verbalize an understanding of the role that distorted thinking plays in creating fears, excessive worry, and ruminations.  [name] participated in the creation of the treatment plan)   Tawni Louder, LCMHC

## 2024-05-22 NOTE — Progress Notes (Unsigned)
 "  Subjective:   Patient ID: [name], female    DOB: May 16, 1976, 48 y.o.   MRN: [id number]  Discussed the use of AI scribe software for clinical note transcription with the patient, who gave verbal consent to proceed.  History of Present Illness [name] is a 48 year old female with anxiety and depression who presents with worsening anxiety symptoms.  She describes her anxiety symptoms as increasingly debilitating, with sensations of being 'bound up by like barbed wire' and feeling 'choked' if she does not scream. She experiences significant fear of leaving the house, being around people, and is particularly anxious about car crashes. These symptoms have led to avoidance of social interactions and public places, including family gatherings and shopping.  She has been on Lexapro  (escitalopram ) and clonazepam , with the latter providing some relief for acute anxiety episodes. However, there is no noticeable improvement with Lexapro , and her anxiety remains persistent and overwhelming, impacting daily functioning and social interactions. She has a history of trying mirtazapine , which made her feel 'loopy' and sleepy, and she does not recall any other long-term medications for anxiety or depression.  She completed cardiac rehab, which she found challenging due to the need to 'fake being happy' and interact with others. Her daily experiences fluctuate, with some days starting well and deteriorating by evening, while others are difficult from the moment she wakes up.  In terms of weight management, she has been working on a proper eating plan and was recently approved for Mounjaro , which she started a week ago. She previously tried Victoza , which was effective but not covered by insurance.  Review of Systems  Constitutional: Negative.   HENT: Negative.    Eyes: Negative.   Respiratory:  Negative for cough, chest tightness and shortness of breath.   Cardiovascular:  Negative for  chest pain, palpitations and leg swelling.  Gastrointestinal:  Negative for abdominal distention, abdominal pain, constipation, diarrhea, nausea and vomiting.  Musculoskeletal: Negative.   Skin: Negative.   Neurological: Negative.   Psychiatric/Behavioral:  Positive for decreased concentration and dysphoric mood. The patient is nervous/anxious.     Objective:  Physical Exam Constitutional:      Appearance: She is well-developed.  HENT:     Head: Normocephalic and atraumatic.  Cardiovascular:     Rate and Rhythm: Normal rate and regular rhythm.  Pulmonary:     Effort: Pulmonary effort is normal. No respiratory distress.     Breath sounds: Normal breath sounds. No wheezing or rales.  Abdominal:     General: Bowel sounds are normal. There is no distension.     Palpations: Abdomen is soft.     Tenderness: There is no abdominal tenderness.  Musculoskeletal:     Cervical back: Normal range of motion.  Skin:    General: Skin is warm and dry.  Neurological:     Mental Status: She is alert and oriented to person, place, and time.     Coordination: Coordination normal.     Vitals:   05/22/24 1332  BP: 120/80  Pulse: 94  Temp: 98.4 F (36.9 C)  TempSrc: Oral  SpO2: 97%  Weight: 200 lb 3.2 oz (90.8 kg)  Height: 5' 5 (1.651 m)  I personally spent a total of 32 minutes in the care of the patient today including getting/reviewing separately obtained history, performing a medically appropriate exam/evaluation, and counseling and educating .   Assessment and Plan Assessment & Plan Generalized anxiety disorder and major depressive disorder  Persistent symptoms continue despite escitalopram , with clonazepam  providing temporary relief. Paxil  is chosen for managing anxiety and depression. Discontinue escitalopram  and initiate Paxil  at 10 mg daily for one week, then increase to 20 mg daily. Continue clonazepam  as needed. Monitor for side effects such as headaches, mucous production,  rash, and itching. Follow up in one month via video visit or MyChart.  Obesity   Her weight has decreased slightly, possibly due to improved eating habits and the initiation of Mounjaro , which was approved and started one week ago. Continue Mounjaro  for weight management.   "

## 2024-05-22 NOTE — Patient Instructions (Signed)
 We have sent in paxil  to take 1/2 pill (10 mg) daily for 1 week. Then increase to 1 pill daily (20 mg). Then we will check in around 1 month to see how you are doing and can change or increase from there.

## 2024-05-29 ENCOUNTER — Ambulatory Visit: Admitting: Licensed Clinical Social Worker

## 2024-05-29 ENCOUNTER — Ambulatory Visit: Attending: Cardiology

## 2024-05-29 ENCOUNTER — Other Ambulatory Visit (HOSPITAL_COMMUNITY): Payer: Self-pay | Admitting: Internal Medicine

## 2024-05-29 DIAGNOSIS — I5022 Chronic systolic (congestive) heart failure: Secondary | ICD-10-CM

## 2024-05-29 DIAGNOSIS — Z95810 Presence of automatic (implantable) cardiac defibrillator: Secondary | ICD-10-CM | POA: Diagnosis not present

## 2024-05-30 ENCOUNTER — Encounter: Payer: Self-pay | Admitting: Licensed Clinical Social Worker

## 2024-05-30 ENCOUNTER — Ambulatory Visit (INDEPENDENT_AMBULATORY_CARE_PROVIDER_SITE_OTHER): Admitting: Licensed Clinical Social Worker

## 2024-05-30 DIAGNOSIS — F331 Major depressive disorder, recurrent, moderate: Secondary | ICD-10-CM

## 2024-05-30 DIAGNOSIS — F411 Generalized anxiety disorder: Secondary | ICD-10-CM

## 2024-05-30 NOTE — Progress Notes (Signed)
 [facility] Counselor/Therapist Progress Note  Patient ID: [name], MRN: [id number]    Date: 05/30/2024  Time Spent: 11:00  am - 11:58 am : 58 Minutes  Treatment Type: Individual Therapy.  Reported Symptoms: Patient engaged and reflective. Affect warm and congruent. Thought process organized with good insight and judgment. No acute distress observed.  Mental Status Exam: Appearance:  Well Groomed     Behavior: Appropriate, Sharing, and Rationalizing  Motor: Normal  Speech/Language:  Normal Rate  Affect: Appropriate  Mood: normal  Thought process: normal  Thought content:   WNL  Sensory/Perceptual disturbances:   WNL  Orientation: oriented to person, place, and time/date  Attention: Good  Concentration: Good  Memory: WNL  Fund of knowledge:  Good  Insight:   Good  Judgment:  Good  Impulse Control: Good   Risk Assessment: Danger to Self:  No Self-injurious Behavior: No Danger to Others: No Duty to Warn:no Physical Aggression / Violence:No  Access to Firearms a concern: No  Gang Involvement:No   Subjective:   [name] participated in the session, in person in the office with the therapist, and consented to treatment. [name] reviewed the events of the past week.      Interventions: Narrative and Insight-Oriented  Diagnosis:   Moderate episode of recurrent major depressive disorder  GAD  Psychiatric Treatment: No , N/A  Treatment Plan:  [name] Abilities/Strengths [name] is open to sessions.    Support System: Family  [name] Treatment Preferences Hybrid  [name] Statement of Needs [name] would like to focus on moving away from being hypervigilant and fearful of overall wellbeing and of loved ones.       Treatment Level Biweekly  Symptoms  Depressive symptoms with social withdrawal tendencies   (Status: improved Avoidant behavior in interpersonal relationships   (Status: improved)  Goals:   [name] Improving mood and  insight. Patient demonstrates increased self-awareness, accountability, and adaptive coping. Progress noted in relational functioning and reduction in avoidant behaviors. Reinforced insight into avoidance patterns and relational repair while supporting continued accountability and faith-based coping. Mood and interpersonal functioning will continue to be monitored. Next session scheduled in one week.   Target Date: 07/24/24 Frequency: Biweekly  Progress: 0 Modality: individual    Therapist will provide referrals for additional resources as appropriate.  Therapist will provide psycho-education regarding [name]'s diagnosis and corresponding treatment approaches and interventions. Licensed Clinical Mental Health Counselor, Tawni Louder, [facility] will support the patient's ability to achieve the goals identified. will employ CBT, BA, Problem-solving, Solution Focused, Mindfulness,  coping skills, & other evidenced-based practices will be used to promote progress towards healthy functioning to help manage decrease symptoms associated with her diagnosis.   Reduce overall level, frequency, and intensity of the feelings of depression, anxiety and panic evidenced by decreased negative rumination and worry about safety and wellbeing of self and others from 6 to 7 days/week to 0 to 1 days/week per [name] report for at least 3 consecutive months. Verbally express understanding of the relationship between feelings of depression, anxiety and their impact on thinking patterns and behaviors. Verbalize an understanding of the role that distorted thinking plays in creating fears, excessive worry, and ruminations.  [name] participated in the creation of the treatment plan)    Tawni Louder, LCMHC

## 2024-05-30 NOTE — Progress Notes (Signed)
 EPIC Encounter for ICM Monitoring  Patient Name: [name] is a 49 y.o. female Date: 05/30/2024 Primary Care Physican: Rollene Almarie LABOR, MD Primary Cardiologist: Bensimhon Electrophysiologist: Inocencio 12/28/2022 Weight: 218 lbs       02/03/2023 Weight: 200 lbs 07/28/2023 Weight: 209 lbs 08/05/2023 Office Weight: 215 lbs 09/27/2023 Weight: 217 lbs 01/25/2024 Office Weight: 210 lbs 03/06/2024 Office Weight: 202 lbs 03/28/2024 Office Weight: 204 lbs 05/30/2024 Weight: 193 lbs   Spoke with patient and heart failure questions reviewed.  Transmission results reviewed.  Pt had weight gain over the holidays due to foods higher in salt and fluid intake increase.  She is feeling well now and weight is at baseline.   Since 04/18/2024 ICM Remote Transmission: [facility] HF Index currently is 11.  Index range was 5 on 05/19/2024 and highest was 15 on 05/21/2024   Prescribed: Torsemide  20 mg Take 1 tablet (20 mg total) by mouth every other day   Labs: 05/04/2025 Creatinine 1.07, BUN 13, Potassium 3.7, Sodium 134, GFR >60 04/05/2024 Creatinine 1.12, BUN 12, Potassium 3.6, Sodium 137, GFR 60 03/08/2024 Creatinine 0.95, BUN 18, Potassium 3.9, Sodium 136, GFR >60  03/01/2024 Creatinine 0.97, BUN 23, Potassium 4.3, Sodium 139, GFR >60  02/26/2024 Creatinine 1.72, BUN 42, Potassium 2.9, Sodium 134, GFR 36  02/23/2024 Creatinine 1.16, BUN 15, Potassium 4.1, Sodium 135, GFR 58 01/31/2024 Creatinine 0.85, BUN 15, Potassium 137, Sodium 137, GFR >60 11/29/2023 Creatinine 0.70, BUN 8,   Potassium 3.9, Sodium 145, GFR >90 11/15/2023 Creatinine 0.83, BUN 8,   Potassium 4.3, Sodium 141, GFR >60 A complete set of results can be found in Results Review.   Recommendations: She took extra fluid medication over the last week and does not feel like she has any fluid.  Follow-up plan: ICM clinic phone appointment 06/29/2024.  91 day device clinic remote transmission 06/21/2024.              EP/Cardiology next office  visit:    Recall 03/16/2025 with Dr Almetta.          Copy of ICM check sent to Dr. Inocencio.   Remote Monitoring Medically Necessary for Heart Failure Management.  3 Month [facility] Heart Failure Index:    8 Day Data Trend:          [name], RN 05/30/2024 2:44 PM

## 2024-06-05 ENCOUNTER — Encounter: Payer: Self-pay | Admitting: Internal Medicine

## 2024-06-05 ENCOUNTER — Ambulatory Visit: Admitting: Licensed Clinical Social Worker

## 2024-06-05 ENCOUNTER — Ambulatory Visit: Admitting: Internal Medicine

## 2024-06-05 VITALS — BP 121/85 | HR 89 | Temp 97.8°F | Ht 65.0 in | Wt 195.8 lb

## 2024-06-05 DIAGNOSIS — R7301 Impaired fasting glucose: Secondary | ICD-10-CM | POA: Diagnosis not present

## 2024-06-05 DIAGNOSIS — I5042 Chronic combined systolic (congestive) and diastolic (congestive) heart failure: Secondary | ICD-10-CM

## 2024-06-05 DIAGNOSIS — Z8639 Personal history of other endocrine, nutritional and metabolic disease: Secondary | ICD-10-CM

## 2024-06-05 DIAGNOSIS — I428 Other cardiomyopathies: Secondary | ICD-10-CM | POA: Diagnosis not present

## 2024-06-05 DIAGNOSIS — Z1159 Encounter for screening for other viral diseases: Secondary | ICD-10-CM

## 2024-06-05 DIAGNOSIS — F331 Major depressive disorder, recurrent, moderate: Secondary | ICD-10-CM | POA: Diagnosis not present

## 2024-06-05 DIAGNOSIS — D500 Iron deficiency anemia secondary to blood loss (chronic): Secondary | ICD-10-CM | POA: Diagnosis not present

## 2024-06-05 DIAGNOSIS — E559 Vitamin D deficiency, unspecified: Secondary | ICD-10-CM

## 2024-06-05 DIAGNOSIS — J4520 Mild intermittent asthma, uncomplicated: Secondary | ICD-10-CM

## 2024-06-05 DIAGNOSIS — Z0000 Encounter for general adult medical examination without abnormal findings: Secondary | ICD-10-CM

## 2024-06-05 DIAGNOSIS — I10 Essential (primary) hypertension: Secondary | ICD-10-CM

## 2024-06-05 DIAGNOSIS — Z23 Encounter for immunization: Secondary | ICD-10-CM | POA: Diagnosis not present

## 2024-06-05 DIAGNOSIS — Z0001 Encounter for general adult medical examination with abnormal findings: Secondary | ICD-10-CM

## 2024-06-05 DIAGNOSIS — E538 Deficiency of other specified B group vitamins: Secondary | ICD-10-CM | POA: Diagnosis not present

## 2024-06-05 LAB — LIPID PANEL
Cholesterol: 159 mg/dL (ref 28–200)
HDL: 59 mg/dL
LDL Cholesterol: 87 mg/dL (ref 10–99)
NonHDL: 100.21
Total CHOL/HDL Ratio: 3
Triglycerides: 66 mg/dL (ref 10.0–149.0)
VLDL: 13.2 mg/dL (ref 0.0–40.0)

## 2024-06-05 LAB — COMPREHENSIVE METABOLIC PANEL WITH GFR
ALT: 21 U/L (ref 3–35)
AST: 25 U/L (ref 5–37)
Albumin: 4.8 g/dL (ref 3.5–5.2)
Alkaline Phosphatase: 126 U/L — ABNORMAL HIGH (ref 39–117)
BUN: 36 mg/dL — ABNORMAL HIGH (ref 6–23)
CO2: 33 meq/L — ABNORMAL HIGH (ref 19–32)
Calcium: 10 mg/dL (ref 8.4–10.5)
Chloride: 91 meq/L — ABNORMAL LOW (ref 96–112)
Creatinine, Ser: 1.49 mg/dL — ABNORMAL HIGH (ref 0.40–1.20)
GFR: 40.8 mL/min — ABNORMAL LOW
Glucose, Bld: 102 mg/dL — ABNORMAL HIGH (ref 70–99)
Potassium: 3.2 meq/L — ABNORMAL LOW (ref 3.5–5.1)
Sodium: 135 meq/L (ref 135–145)
Total Bilirubin: 0.6 mg/dL (ref 0.2–1.2)
Total Protein: 8.7 g/dL — ABNORMAL HIGH (ref 6.0–8.3)

## 2024-06-05 LAB — CBC
HCT: 41.9 % (ref 36.0–46.0)
Hemoglobin: 14.3 g/dL (ref 12.0–15.0)
MCHC: 34.1 g/dL (ref 30.0–36.0)
MCV: 89.8 fl (ref 78.0–100.0)
Platelets: 218 K/uL (ref 150.0–400.0)
RBC: 4.67 Mil/uL (ref 3.87–5.11)
RDW: 14.1 % (ref 11.5–15.5)
WBC: 7.9 K/uL (ref 4.0–10.5)

## 2024-06-05 LAB — MICROALBUMIN / CREATININE URINE RATIO
Creatinine,U: 212.9 mg/dL
Microalb Creat Ratio: 10.3 mg/g (ref 0.0–30.0)
Microalb, Ur: 2.2 mg/dL — ABNORMAL HIGH (ref 0.7–1.9)

## 2024-06-05 LAB — VITAMIN B12: Vitamin B-12: 274 pg/mL (ref 211–911)

## 2024-06-05 LAB — HEMOGLOBIN A1C: Hgb A1c MFr Bld: 5.2 % (ref 4.6–6.5)

## 2024-06-05 LAB — VITAMIN D 25 HYDROXY (VIT D DEFICIENCY, FRACTURES): VITD: 46.75 ng/mL (ref 30.00–100.00)

## 2024-06-05 NOTE — Assessment & Plan Note (Signed)
Checking B12 level and adjust as needed.  

## 2024-06-05 NOTE — Assessment & Plan Note (Signed)
 Checking CBC for stability.

## 2024-06-05 NOTE — Assessment & Plan Note (Signed)
 Postpartum cardiomyopathy and seeing cardiology continue current regimen.

## 2024-06-05 NOTE — Assessment & Plan Note (Signed)
Flu shot given. Pneumonia up to date. Shingrix counseled. Tetanus up to date. Colonoscopy up to date. Mammogram up to date, pap smear up to date. Counseled about sun safety and mole surveillance. Counseled about the dangers of distracted driving. Given 10 year screening recommendations.

## 2024-06-05 NOTE — Progress Notes (Signed)
 k  Subjective:   Patient ID: [name], female    DOB: March 02, 1976, 49 y.o.   MRN: [id number]  The patient is here for physical. Pertinent topics discussed: Discussed the use of AI scribe software for clinical note transcription with the patient, who gave verbal consent to proceed.  History of Present Illness [name] is a 49 year old female who presents for a follow-up visit.  She has been experiencing increased pruritus since July, primarily affecting her chest and arms, described as persistent without any visible rash.  Her depression is being managed with therapy and medication. She recently started on Paxil  (paroxetine ) and has not noticed any side effects yet. She has reduced her use of Klonopin  due to constipation issues, using it only when necessary.  She is currently on Mounjaro , which she started in December, and has been focusing on maintaining a balanced diet with adequate protein and water intake. Managing her nutrition is challenging due to her gastric sleeve and the effects of Mounjaro  on her appetite.  PMH, [facility], social history reviewed and updated  Review of Systems  Constitutional: Negative.   HENT: Negative.    Eyes: Negative.   Respiratory:  Negative for cough, chest tightness and shortness of breath.   Cardiovascular:  Negative for chest pain, palpitations and leg swelling.  Gastrointestinal:  Negative for abdominal distention, abdominal pain, constipation, diarrhea, nausea and vomiting.  Musculoskeletal: Negative.   Skin: Negative.        itching  Neurological: Negative.   Psychiatric/Behavioral: Negative.      Objective:  Physical Exam Constitutional:      Appearance: She is well-developed.  HENT:     Head: Normocephalic and atraumatic.  Cardiovascular:     Rate and Rhythm: Normal rate and regular rhythm.  Pulmonary:     Effort: Pulmonary effort is normal. No respiratory distress.     Breath sounds: Normal breath sounds. No wheezing or  rales.  Abdominal:     General: Bowel sounds are normal. There is no distension.     Palpations: Abdomen is soft.     Tenderness: There is no abdominal tenderness.  Musculoskeletal:     Cervical back: Normal range of motion.  Skin:    General: Skin is warm and dry.  Neurological:     Mental Status: She is alert and oriented to person, place, and time.     Coordination: Coordination normal.     Vitals:   06/05/24 0853  BP: 121/85  Pulse: 89  Temp: 97.8 F (36.6 C)  SpO2: 95%  Weight: 195 lb 12.8 oz (88.8 kg)  Height: 5' 5 (1.651 m)   Assessment & Plan:  Flu shot given

## 2024-06-05 NOTE — Assessment & Plan Note (Signed)
 Checking HgA1c today and lipid and UACR. Hx prior to weight loss surgery. She just started on mounjaro  recently last known HgA1c 5.2 so likely not active.

## 2024-06-05 NOTE — Assessment & Plan Note (Signed)
Checking vitamin D level and adjust as needed.  

## 2024-06-05 NOTE — Assessment & Plan Note (Signed)
 Still active with moderate symptoms. Started paxil  20 mg daily about 1-2 weeks ago. She will let us  know in 2-3 weeks how she is doing and will increase dose as needed.

## 2024-06-05 NOTE — Assessment & Plan Note (Signed)
 Checking HgA1c and adjust as needed.

## 2024-06-05 NOTE — Assessment & Plan Note (Signed)
 Stable on regimen and seeing cardiology and will continue. Encouraged activity.

## 2024-06-05 NOTE — Assessment & Plan Note (Signed)
 Mild intermittent and using albuterol  prn. Continue. No flare today.

## 2024-06-06 ENCOUNTER — Ambulatory Visit: Admitting: Licensed Clinical Social Worker

## 2024-06-06 ENCOUNTER — Encounter (INDEPENDENT_AMBULATORY_CARE_PROVIDER_SITE_OTHER): Payer: Self-pay | Admitting: Internal Medicine

## 2024-06-06 ENCOUNTER — Ambulatory Visit (INDEPENDENT_AMBULATORY_CARE_PROVIDER_SITE_OTHER): Admitting: Internal Medicine

## 2024-06-06 VITALS — BP 113/77 | HR 102 | Temp 98.2°F | Ht 65.0 in | Wt 191.0 lb

## 2024-06-06 DIAGNOSIS — E66812 Obesity, class 2: Secondary | ICD-10-CM

## 2024-06-06 DIAGNOSIS — Z9884 Bariatric surgery status: Secondary | ICD-10-CM | POA: Diagnosis not present

## 2024-06-06 DIAGNOSIS — Z6830 Body mass index (BMI) 30.0-30.9, adult: Secondary | ICD-10-CM | POA: Diagnosis not present

## 2024-06-06 DIAGNOSIS — F411 Generalized anxiety disorder: Secondary | ICD-10-CM | POA: Diagnosis not present

## 2024-06-06 DIAGNOSIS — I5042 Chronic combined systolic (congestive) and diastolic (congestive) heart failure: Secondary | ICD-10-CM

## 2024-06-06 DIAGNOSIS — I110 Hypertensive heart disease with heart failure: Secondary | ICD-10-CM

## 2024-06-06 DIAGNOSIS — I10 Essential (primary) hypertension: Secondary | ICD-10-CM

## 2024-06-06 DIAGNOSIS — F331 Major depressive disorder, recurrent, moderate: Secondary | ICD-10-CM

## 2024-06-06 DIAGNOSIS — G4733 Obstructive sleep apnea (adult) (pediatric): Secondary | ICD-10-CM | POA: Diagnosis not present

## 2024-06-06 DIAGNOSIS — E1169 Type 2 diabetes mellitus with other specified complication: Secondary | ICD-10-CM | POA: Diagnosis not present

## 2024-06-06 DIAGNOSIS — E11A Type 2 diabetes mellitus without complications in remission: Secondary | ICD-10-CM

## 2024-06-06 DIAGNOSIS — E669 Obesity, unspecified: Secondary | ICD-10-CM

## 2024-06-06 DIAGNOSIS — E66811 Obesity, class 1: Secondary | ICD-10-CM | POA: Diagnosis not present

## 2024-06-06 LAB — HEPATITIS C ANTIBODY: Hepatitis C Ab: NONREACTIVE

## 2024-06-06 MED ORDER — TIRZEPATIDE 5 MG/0.5ML ~~LOC~~ SOAJ: 5.0000 mg | SUBCUTANEOUS | 0 refills | Status: AC

## 2024-06-06 NOTE — Assessment & Plan Note (Signed)
" °  Stable.  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  Most recent echocardiogram from November showed the following:  1. Left ventricular ejection fraction, by estimation, is 35%. The left  ventricle has moderately decreased function. The left ventricle  demonstrates global hypokinesis. Left ventricular diastolic parameters are  consistent with Grade I diastolic  dysfunction (impaired relaxation). The average left ventricular global  longitudinal strain is -11.6 %. The global longitudinal strain is  abnormal.   2. Right ventricular systolic function is mildly reduced. The right  ventricular size is normal. There is normal pulmonary artery systolic  pressure. The estimated right ventricular systolic pressure is 18.7 mmHg.   3. Left atrial size was mildly dilated.   4. The mitral valve is normal in structure. Trivial mitral valve  regurgitation. No evidence of mitral stenosis.   5. The aortic valve is tricuspid. Aortic valve regurgitation is not  visualized. No aortic stenosis is present.   6. The inferior vena cava is normal in size with greater than 50%  respiratory variability, suggesting right atrial pressure of 3 mmHg.   She will continue with GBMT "

## 2024-06-06 NOTE — Assessment & Plan Note (Signed)
 Medical weight management in a patient with history of sleeve gastrectomy She has lost 31 pounds, approximately 14% of total body weight over six months, with a current weight of 191 pounds. She is following a 1200 calorie nutrition plan 75% of the time and exercising three days a week. She experiences difficulty consuming 1200 calories due to early satiety and hiccups. She is on Mounjaro , which helps reduce food noise and provides inhibitory control. The current dose is 2.5 mg, and there is consideration to increase to 5 mg to maintain effects as the week progresses. She is aware of dietary restrictions with Mounjaro , including avoiding high fats, sugars, alcohol, spicy, and fried foods. She is exploring meal options to maintain caloric intake and is considering home food delivery kits for convenience. - Increased Mounjaro  to 5 mg. - Continue 1200 calorie nutrition plan. - Continue exercise regimen three days a week. - Monitor dietary intake and adjust as needed. - Consider home food delivery kits for meal variety.

## 2024-06-06 NOTE — Assessment & Plan Note (Addendum)
 Stable on current therapy.  Weight reduction is anticipated to improve OSA severity.  Continue current weight management reassess symptoms as weight loss progresses.

## 2024-06-06 NOTE — Progress Notes (Signed)
 [facility] Counselor/Therapist Progress Note  Patient ID: [name], MRN: [id number]    Date: 06/06/2024  Time Spent: 12:00  pm - 12:54 pm : 54 Minutes  Treatment Type: Individual Therapy.  Reported Symptoms: Patient was alert, oriented 4, and actively engaged in session. Participated thoughtfully in review of career interest assessment, demonstrating insight and curiosity. Affect appropriate; thought process logical and goal-directed.  Mental Status Exam: Appearance:  Well Groomed     Behavior: Appropriate and Sharing  Motor: Normal  Speech/Language:  Normal Rate  Affect: Appropriate  Mood: normal  Thought process: normal  Thought content:   WNL  Sensory/Perceptual disturbances:   WNL  Orientation: oriented to person, place, and time/date  Attention: Good  Concentration: Good  Memory: WNL  Fund of knowledge:  Good  Insight:   Good  Judgment:  Good  Impulse Control: Good   Risk Assessment: Danger to Self:  No Self-injurious Behavior: No Danger to Others: No Duty to Warn:no Physical Aggression / Violence:No  Access to Firearms a concern: No  Gang Involvement:No   Subjective:   [name] participated in the session, in person in the office with the therapist, and consented to treatment. Fontaine reviewed the events of the past week. Patient expressed interest in exploring career options as part of a second chapter in life. She reported uncertainty about direction but openness to reflection and new possibilities. Patient was receptive to discussing assessment results and exploring tools to support career exploration.     Interventions: Mindfulness Meditation and Psycho-education/Bibliotherapy  Diagnosis:   Moderate episode of recurrent major depressive disorder  GAD    Psychiatric Treatment: No , N/A  Treatment Plan:  Client Abilities/Strengths Koi is open to sessions.    Support System: Family and Friends  Client Treatment  Preferences Hybrid  Client Statement of Needs [name] would like to focus on moving away from being hypervigilant and fearful of overall wellbeing and of loved ones.        Treatment Level Biweekly  Symptoms  Career indecision and uncertainty   (Status: maintained) Reduced confidence related to career transition   (Status: maintained)  Goals:   [name] Patient is in the exploratory stage of career development, seeking alignment between personal values, strengths, and realistic vocational options. Increased self-awareness noted as assessment results were processed, supporting motivation and future goal setting. Reviewed career interest assessment in detail to stimulate vocational exploration. 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 Patient will explore selected careers, process insights through journaling, and continue discussion and goal refinement in the next session.   Target Date: 07/24/24 Frequency: Biweekly  Progress: 0 Modality: individual    Therapist will provide referrals for additional resources as appropriate.  Therapist will provide psycho-education regarding Shaely's diagnosis and corresponding treatment approaches and interventions. Licensed Clinical Mental Health Counselor, Tawni Louder, [facility] will support the patient's ability to achieve the goals identified. will employ CBT, BA, Problem-solving, Solution Focused, Mindfulness,  coping skills, & other evidenced-based practices will be used to promote progress towards healthy functioning to help manage decrease symptoms associated with her diagnosis.   Reduce overall level, frequency, and intensity of the feelings of depression, anxiety and panic evidenced by decreased negative rumination and worry about safety and wellbeing of self and others  from 6 to 7 days/week to 0 to 1 days/week per client report for at least 3  consecutive months. Verbally express understanding of the  relationship between feelings of depression, anxiety and their impact on thinking patterns and behaviors. Verbalize an understanding of the role that distorted thinking plays in creating fears, excessive worry, and ruminations.  [name] participated in the creation of the treatment plan)      Tawni Louder, LCMHC

## 2024-06-06 NOTE — Progress Notes (Signed)
 "  Office: [phone]  /  Fax: [phone]  Weight Summary and Body Composition Analysis (BIA)  Vitals Temp: 98.2 F (36.8 C) BP: 113/77 Pulse Rate: (!) 102 SpO2: 95 %   Anthropometric Measurements Height: 5' 5 (1.651 m) Weight: 191 lb (86.6 kg) BMI (Calculated): 31.78 Weight at Last Visit: 194 lb Weight Lost Since Last Visit: 3 lb Weight Gained Since Last Visit: 0 lb Starting Weight: 211 lb Total Weight Loss (lbs): 20 lb (9.072 kg)   Body Composition  Body Fat %: 40.3 % Fat Mass (lbs): 77.2 lbs Muscle Mass (lbs): 108.8 lbs Total Body Water (lbs): 69.2 lbs Visceral Fat Rating : 10    RMR: 1570  Today's Visit #: 7  Starting Date: 01/03/24   Subjective   Chief Complaint: Obesity  Interval History  Discussed the use of AI scribe software for clinical note transcription with the patient, who gave verbal consent to proceed.  History of Present Illness [name] is a 49 year old female who presents for medical weight management.  [name] has a history of gastric sleeve surgery and adheres to a 1200 calorie nutrition plan approximately 75% of the time. [name] exercises three days a week for 60 to 70 minutes, focusing on strength training. Since her last visit, [name] has lost three pounds.  [name] experiences early satiety, feeling full after three to four bites, which sometimes leads to hiccups and vomiting if [name] eats more. To manage this, [name] eats small amounts throughout the day, such as tuna packs and boiled eggs. [name] also experiences constipation and lethargy, which [name] attributes to insufficient calorie intake.  [name] notes significant changes in her clothing fit and has received comments from family members about her weight loss. Her husband, a postman who walks about ten miles a day, has also lost weight due to dietary changes in the household. Her daughter, who also attends the clinic, eats healthier, influencing the family's diet.  Her current medication  regimen includes Mounjaro  and Jardiance . [name] takes Mounjaro  at a dose of 2.5 mg on Tuesday mornings and notices an increase in food cravings towards the end of the week, particularly on Sundays.  [name] has a history of non-ischemic cardiomyopathy and reports no current shortness of breath or other cardiac symptoms. [name] has been cleared by cardiology to start exercising, which [name] finds beneficial both physically and psychologically. [name] also has a history of diabetes, which is currently in remission.     Challenges affecting patient progress: none.    Pharmacotherapy for weight management: [name] is currently taking Mounjaro  2.5 mg once a week.   Assessment and Plan   Treatment Plan For Obesity:  Recommended Dietary Goals  [name] is currently in the action stage of change. As such, her goal is to continue weight management plan. [name] has agreed to: continue current plan  Behavioral Health and Counseling  We discussed the following behavioral modification strategies today: continue to work on maintaining a reduced calorie state, getting the recommended amount of protein, incorporating whole foods, making healthy choices, staying well hydrated and practicing mindfulness when eating. and increase protein intake, fibrous foods (25 grams per day for women, 30 grams for men) and water to improve satiety and decrease hunger signals. .  Additional education and resources provided today: None  Recommended Physical Activity Goals  [name] has been advised to work up to 150 minutes of moderate intensity aerobic activity a week and strengthening exercises 2-3 times per week for cardiovascular health, weight loss  maintenance and preservation of muscle mass.  [name] has agreed to :  Think about enjoyable ways to increase daily physical activity and overcoming barriers to exercise, Increase physical activity in their day and reduce sedentary time (increase NEAT)., Increase volume of physical activity to a goal  of 240 minutes a week, and Combine aerobic and strengthening exercises for efficiency and improved cardiometabolic health.  Medical Interventions and Pharmacotherapy  We discussed various medication options to help [name] with her weight loss efforts and we both agreed to : Increase increase Mounjaro  to 5 mg once a week and Patient was counseled on the importance of maintaining healthy lifestyle habits, including balanced nutrition, regular physical activity, and behavioral modifications, while taking antiobesity medication.  Patient verbalized understanding that medication is an adjunct to, not a replacement for, lifestyle changes and that the long-term success and weight maintenance depend on continued adherence to these strategies.  Associated Conditions Impacted by Obesity Treatment  Assessment & Plan Class 1 obesity with serious comorbidity and body mass index (BMI) of 30.0 to 30.9 in adult, unspecified obesity type S/P laparoscopic sleeve gastrectomy Medical weight management in a patient with history of sleeve gastrectomy [name] has lost 31 pounds, approximately 14% of total body weight over six months, with a current weight of 191 pounds. [name] is following a 1200 calorie nutrition plan 75% of the time and exercising three days a week. [name] experiences difficulty consuming 1200 calories due to early satiety and hiccups. [name] is on Mounjaro , which helps reduce food noise and provides inhibitory control. The current dose is 2.5 mg, and there is consideration to increase to 5 mg to maintain effects as the week progresses. [name] is aware of dietary restrictions with Mounjaro , including avoiding high fats, sugars, alcohol, spicy, and fried foods. [name] is exploring meal options to maintain caloric intake and is considering home food delivery kits for convenience. - Increased Mounjaro  to 5 mg. - Continue 1200 calorie nutrition plan. - Continue exercise regimen three days a week. - Monitor dietary intake and  adjust as needed. - Consider home food delivery kits for meal variety. Type 2 diabetes mellitus in patient with obesity (HCC) Diabetes in remission.  Stable at this time glycemic control is being addressed to the weight management plan above with expected improvement in insulin  resistance and metabolic parameters as weight loss progresses.  Will continue to monitor A1c and glucose trends. Essential hypertension Blood pressure is at goal.  On metoprolol , spironolactone , loop diuretic, Jardiance  and Entresto  without adverse effects.  No medication changes.  Blood pressure control and medication burden expected to improve with continued weight loss.  Chronic combined systolic and diastolic heart failure (HCC)  Stable.  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  Most recent echocardiogram from November showed the following:  1. Left ventricular ejection fraction, by estimation, is 35%. The left  ventricle has moderately decreased function. The left ventricle  demonstrates global hypokinesis. Left ventricular diastolic parameters are  consistent with Grade I diastolic  dysfunction (impaired relaxation). The average left ventricular global  longitudinal strain is -11.6 %. The global longitudinal strain is  abnormal.   2. Right ventricular systolic function is mildly reduced. The right  ventricular size is normal. There is normal pulmonary artery systolic  pressure. The estimated right ventricular systolic pressure is 18.7 mmHg.   3. Left atrial size was mildly dilated.   4. The mitral valve is normal in structure. Trivial mitral valve  regurgitation. No evidence of mitral  stenosis.   5. The aortic valve is tricuspid. Aortic valve regurgitation is not  visualized. No aortic stenosis is present.   6. The inferior vena cava is normal in size with greater than 50%  respiratory variability, suggesting right atrial pressure of 3 mmHg.   [name] will  continue with GBMT OSA (obstructive sleep apnea) Stable on current therapy.  Weight reduction is anticipated to improve OSA severity.  Continue current weight management reassess symptoms as weight loss progresses.    Obesity related comorbidities place patient at elevated cardiovascular and metabolic risk necessitating close monitoring and longitudinal management.     Objective   Physical Exam:  Blood pressure 113/77, pulse (!) 102, temperature 98.2 F (36.8 C), height 5' 5 (1.651 m), weight 191 lb (86.6 kg), SpO2 95%. Body mass index is 31.78 kg/m.  General: [name] is overweight, cooperative, alert, well developed, and in no acute distress. PSYCH: Has normal mood, affect and thought process.   HEENT: EOMI, sclerae are anicteric. Lungs: Normal breathing effort, no conversational dyspnea. Extremities: No edema.  Neurologic: No gross sensory or motor deficits. No tremors or fasciculations noted.    Diagnostic Data Reviewed:  BMET    Component Value Date/Time   NA 135 06/05/2024 0917   NA 138 07/23/2023 1322   K 3.2 (L) 06/05/2024 0917   CL 91 (L) 06/05/2024 0917   CO2 33 (H) 06/05/2024 0917   GLUCOSE 102 (H) 06/05/2024 0917   BUN 36 (H) 06/05/2024 0917   BUN 22 07/23/2023 1322   CREATININE 1.49 (H) 06/05/2024 0917   CREATININE 0.90 03/26/2016 1536   CALCIUM 10.0 06/05/2024 0917   GFRNONAA >60 05/04/2024 1144   GFRNONAA >89 01/18/2014 1621   GFRAA >60 02/15/2020 1845   GFRAA >89 01/18/2014 1621   Lab Results  Component Value Date   HGBA1C 5.2 06/05/2024   HGBA1C 5.4 11/09/2015   Lab Results  Component Value Date   INSULIN  11.8 01/03/2024   Lab Results  Component Value Date   TSH 1.785 05/04/2024   CBC    Component Value Date/Time   WBC 7.9 06/05/2024 0917   RBC 4.67 06/05/2024 0917   HGB 14.3 06/05/2024 0917   HGB 10.6 (L) 09/07/2019 1159   HCT 41.9 06/05/2024 0917   HCT 32.6 (L) 08/06/2020 1517   PLT 218.0 06/05/2024 0917   PLT 278 09/07/2019 1159    MCV 89.8 06/05/2024 0917   MCV 82 09/07/2019 1159   MCH 30.0 05/04/2024 1144   MCHC 34.1 06/05/2024 0917   RDW 14.1 06/05/2024 0917   RDW 14.7 09/07/2019 1159   Iron  Studies    Component Value Date/Time   IRON  44 02/23/2024 1600   TIBC 372 02/23/2024 1600   FERRITIN 68 02/23/2024 1600   IRONPCTSAT 12 02/23/2024 1600   Lipid Panel     Component Value Date/Time   CHOL 159 06/05/2024 0917   TRIG 66.0 06/05/2024 0917   HDL 59.00 06/05/2024 0917   CHOLHDL 3 06/05/2024 0917   VLDL 13.2 06/05/2024 0917   LDLCALC 87 06/05/2024 0917   Hepatic Function Panel     Component Value Date/Time   PROT 8.7 (H) 06/05/2024 0917   PROT 7.3 05/15/2021 1032   ALBUMIN  4.8 06/05/2024 0917   ALBUMIN  4.5 05/15/2021 1032   AST 25 06/05/2024 0917   ALT 21 06/05/2024 0917   ALKPHOS 126 (H) 06/05/2024 0917   BILITOT 0.6 06/05/2024 0917   BILITOT 0.3 05/15/2021 1032   BILIDIR <0.1 08/26/2020 0201  IBILI 0.6 11/21/2017 2155      Component Value Date/Time   TSH 1.785 05/04/2024 1144   TSH 1.750 07/23/2023 1321   Nutritional Lab Results  Component Value Date   VD25OH 46.75 06/05/2024   VD25OH 24.1 (L) 01/03/2024   VD25OH 14.88 (L) 06/11/2023    Medications: Outpatient Encounter Medications as of 06/06/2024  Medication Sig Note   albuterol  (VENTOLIN  HFA) 108 (90 Base) MCG/ACT inhaler Inhale 1-2 puffs into the lungs every 6 (six) hours as needed for wheezing or shortness of breath.    clonazePAM  (KLONOPIN ) 0.5 MG tablet Take 1 tablet (0.5 mg total) by mouth 2 (two) times daily as needed for anxiety.    diclofenac  (VOLTAREN ) 75 MG EC tablet Take 1 tablet by mouth twice daily as needed    empagliflozin  (JARDIANCE ) 10 MG TABS tablet Take 1 tablet (10 mg total) by mouth daily before breakfast.    Insulin  Pen Needle (B-D UF III MINI PEN NEEDLES) 31G X 5 MM MISC 1 Needle by Does not apply route daily.    ipratropium-albuterol  (DUONEB) 0.5-2.5 (3) MG/3ML SOLN USE 1 AMPULE IN NEBULIZER EVERY 6 HOURS  AS NEEDED    ivabradine  (CORLANOR ) 7.5 MG TABS tablet Take 1 tablet (7.5 mg total) by mouth 2 (two) times daily with a meal.    losartan  (COZAAR ) 25 MG tablet Take 0.5 tablets (12.5 mg total) by mouth at bedtime.    metoprolol  succinate (TOPROL  XL) 25 MG 24 hr tablet Take 0.5 tablets (12.5 mg total) by mouth daily.    nitroGLYCERIN  (NITROSTAT ) 0.4 MG SL tablet Place 1 tablet (0.4 mg total) under the tongue every 5 (five) minutes as needed for chest pain.    OVER THE COUNTER MEDICATION Take 1 capsule by mouth daily. BARIATRIC VITAMIN    PARoxetine  (PAXIL ) 20 MG tablet Take 1 tablet (20 mg total) by mouth daily.    spironolactone  (ALDACTONE ) 25 MG tablet Take 1 tablet (25 mg total) by mouth daily.    tirzepatide  (MOUNJARO ) 5 MG/0.5ML Pen Inject 5 mg into the skin once a week.    tizanidine  (ZANAFLEX ) 2 MG capsule Take 2 mg by mouth as needed for muscle spasms.    torsemide  (DEMADEX ) 20 MG tablet Take 1 tablet (20 mg total) by mouth every other day.    Ubrogepant  (UBRELVY ) 100 MG TABS Take 1 tablet (100 mg total) by mouth daily as needed (migraine).    Vitamin D , Ergocalciferol , (DRISDOL ) 1.25 MG (50000 UNIT) CAPS capsule Take 1 capsule by mouth once a week 02/23/2024: Takes on Wednesday   [DISCONTINUED] tirzepatide  (MOUNJARO ) 2.5 MG/0.5ML Pen Inject 2.5 mg into the skin once a week.    No facility-administered encounter medications on file as of 06/06/2024.     Follow-Up   Return in about 4 weeks (around 07/04/2024) for For Weight Mangement with Dr. [email or web address] [name] was informed of the importance of frequent follow up visits to maximize her success with intensive lifestyle modifications for her multiple health conditions.  Attestation Statement   Reviewed by clinician on day of visit: allergies, medications, problem list, medical history, surgical history, family history, social history, and previous encounter notes.     Lucas Francyne, MD  "

## 2024-06-06 NOTE — Assessment & Plan Note (Signed)
 Blood pressure is at goal.  On metoprolol , spironolactone , loop diuretic, Jardiance  and Entresto  without adverse effects.  No medication changes.  Blood pressure control and medication burden expected to improve with continued weight loss.

## 2024-06-12 ENCOUNTER — Ambulatory Visit: Payer: Self-pay | Admitting: Internal Medicine

## 2024-06-12 DIAGNOSIS — I5042 Chronic combined systolic (congestive) and diastolic (congestive) heart failure: Secondary | ICD-10-CM

## 2024-06-17 ENCOUNTER — Other Ambulatory Visit (HOSPITAL_COMMUNITY): Payer: Self-pay | Admitting: Internal Medicine

## 2024-06-19 ENCOUNTER — Ambulatory Visit: Payer: Self-pay | Admitting: *Deleted

## 2024-06-19 ENCOUNTER — Telehealth: Admitting: Physician Assistant

## 2024-06-19 DIAGNOSIS — I5042 Chronic combined systolic (congestive) and diastolic (congestive) heart failure: Secondary | ICD-10-CM | POA: Diagnosis not present

## 2024-06-19 NOTE — Telephone Encounter (Signed)
" °  FYI Only or Action Required?: FYI only for provider: [name] appointment scheduled.  Patient was last seen in primary care on 06/06/2024 by Megan Romano, MD.  Called Nurse Triage reporting Leg Swelling.  Symptoms began a week ago.  Interventions attempted: Rest, hydration, or home remedies and Dietary changes.  Symptoms are: gradually worsening.  Triage Disposition: See Physician Within 24 Hours  Patient/caregiver understands and will follow disposition?: Yes    Message from [place] [facility] sent at 06/19/2024  8:55 AM EST  Reason for Triage:Patient has not been taking her  torsemide  (DEMADEX ) 20 MG tablet for a week and her fluid is up and legs hands and feet swollen   Reason for Disposition  [1] MODERATE leg swelling (e.g., swelling extends up to knees) AND [2] new-onset or getting worse  Answer Assessment - Initial Assessment Questions Patient states she was advised due to most recent labs work (kidney function test) to stop her torsemide  for 1 week- and repeat labs. Patient reports since she has stopped her diuretic- she has been retaining fluid- she is up 9lb and is swelling in legs, hand abdomen. Patient was supposed to get labs- but is unable due to weather. Patient would like to know what to do at this point. Patient denies SOB or chest pain- but is concerned about the amount of swelling she is experiencing. [name] appointment scheduled to discuss her medications.   1. ONSET: When did the swelling start? (e.g., minutes, hours, days)     Patient states she has increased swelling since she stopped Torsemide .  2. LOCATION: What part of the leg is swollen?  Are both legs swollen or just one leg?     Heads, wrist, legs-up to knees, adbomen 3. SEVERITY: How bad is the swelling? (e.g., localized; mild, moderate, severe)     Tightness in hands- hard to close 4. REDNESS: Is there redness or signs of infection?     no 5. PAIN: Is the swelling painful to touch? If Yes, ask:  How painful is it?   (Scale 1-10; mild, moderate or severe)     no 6. FEVER: Do you have a fever? If Yes, ask: What is it, how was it measured, and when did it start?      no 7. CAUSE: What do you think is causing the leg swelling?     CHF- was advised to stop diuretic for 1 week per PCP 8. MEDICAL HISTORY: Do you have a history of blood clots (e.g., DVT), cancer, heart failure, kidney disease, or liver failure?     CHF with history of fluid retention 9. RECURRENT SYMPTOM: Have you had leg swelling before? If Yes, ask: When was the last time? What happened that time?     yes 10. OTHER SYMPTOMS: Do you have any other symptoms? (e.g., chest pain, difficulty breathing)       no  Protocols used: Leg Swelling and Edema-A-AH  "

## 2024-06-19 NOTE — Patient Instructions (Signed)
 " [name], thank you for joining [name], PA-C for today's virtual visit.  While this provider is not your primary care provider (PCP), if your PCP is located in our provider database this encounter information will be shared with them immediately following your visit.   A [facility] MyChart account gives you access to today's visit and all your visits, tests, and labs performed at [facility]  click here if you don't have a [facility] MyChart account or go to [email or web address]  Consent: (Patient) DERINDA BARTUS provided verbal consent for this virtual visit at the beginning of the encounter.  Current Medications:  Current Outpatient Medications:    albuterol  (VENTOLIN  HFA) 108 (90 Base) MCG/ACT inhaler, Inhale 1-2 puffs into the lungs every 6 (six) hours as needed for wheezing or shortness of breath., Disp: 18 g, Rfl: 0   clonazePAM  (KLONOPIN ) 0.5 MG tablet, Take 1 tablet (0.5 mg total) by mouth 2 (two) times daily as needed for anxiety., Disp: 30 tablet, Rfl: 1   diclofenac  (VOLTAREN ) 75 MG EC tablet, Take 1 tablet by mouth twice daily as needed, Disp: 60 tablet, Rfl: 0   empagliflozin  (JARDIANCE ) 10 MG TABS tablet, Take 1 tablet (10 mg total) by mouth daily before breakfast., Disp: 30 tablet, Rfl: 5   Insulin  Pen Needle (B-D UF III MINI PEN NEEDLES) 31G X 5 MM MISC, 1 Needle by Does not apply route daily., Disp: 30 each, Rfl: 0   ipratropium-albuterol  (DUONEB) 0.5-2.5 (3) MG/3ML SOLN, USE 1 AMPULE IN NEBULIZER EVERY 6 HOURS AS NEEDED, Disp: 360 mL, Rfl: 0   ivabradine  (CORLANOR ) 7.5 MG TABS tablet, TAKE 1 TABLET BY MOUTH TWICE DAILY WITH A MEAL, Disp: 60 tablet, Rfl: 0   losartan  (COZAAR ) 25 MG tablet, Take 0.5 tablets (12.5 mg total) by mouth at bedtime., Disp: 15 tablet, Rfl: 3   metoprolol  succinate (TOPROL  XL) 25 MG 24 hr tablet, Take 0.5 tablets (12.5 mg total) by mouth daily., Disp: 15 tablet, Rfl: 5   nitroGLYCERIN  (NITROSTAT ) 0.4 MG SL tablet,  Place 1 tablet (0.4 mg total) under the tongue every 5 (five) minutes as needed for chest pain., Disp: 75 tablet, Rfl: 0   OVER THE COUNTER MEDICATION, Take 1 capsule by mouth daily. BARIATRIC VITAMIN, Disp: , Rfl:    PARoxetine  (PAXIL ) 20 MG tablet, Take 1 tablet (20 mg total) by mouth daily., Disp: 30 tablet, Rfl: 1   spironolactone  (ALDACTONE ) 25 MG tablet, Take 1 tablet (25 mg total) by mouth daily., Disp: 30 tablet, Rfl: 5   tirzepatide  (MOUNJARO ) 5 MG/0.5ML Pen, Inject 5 mg into the skin once a week., Disp: 2 mL, Rfl: 0   tizanidine  (ZANAFLEX ) 2 MG capsule, Take 2 mg by mouth as needed for muscle spasms., Disp: , Rfl:    torsemide  (DEMADEX ) 20 MG tablet, Take 1 tablet (20 mg total) by mouth every other day., Disp: 240 tablet, Rfl: 0   Ubrogepant  (UBRELVY ) 100 MG TABS, Take 1 tablet (100 mg total) by mouth daily as needed (migraine)., Disp: 30 tablet, Rfl: 3   Vitamin D , Ergocalciferol , (DRISDOL ) 1.25 MG (50000 UNIT) CAPS capsule, Take 1 capsule by mouth once a week, Disp: 12 capsule, Rfl: 0   Medications ordered in this encounter:  No orders of the defined types were placed in this encounter.    *If you need refills on other medications prior to your next appointment, please contact your pharmacy*  Follow-Up: Call back or seek an in-person evaluation if the symptoms  worsen or if the condition fails to improve as anticipated.  [facility] [phone]  Other Instructions Take 1/2 tablet of the Torsemide  (10 mg) once. Continue to minimize sodium. Elevate lower extremities while resting. It is very important you seek a follow-up evaluation in-person tomorrow including exam and repeat labs to determine next steps. If you note any non-resolving, new, or worsening symptoms despite treatment, please seek an in-person evaluation ASAP.    If you have been instructed to have an in-person evaluation today at a local Urgent Care facility, please use the link below. It will take  you to a list of all of our available [facility], including address, phone number and hours of operation. Please do not delay care.  [facility]  If you or a family member do not have a primary care provider, use the link below to schedule a visit and establish care. When you choose a [facility] primary care physician or advanced practice provider, you gain a long-term partner in health. Find a Primary Care Provider  Learn more about [facility] in-office and virtual care options: [facility] - Get Care Now  "

## 2024-06-19 NOTE — Progress Notes (Signed)
 " Virtual Visit Consent   [name], you are scheduled for a virtual visit with a [facility] provider today. Just as with appointments in the office, your consent must be obtained to participate. Your consent will be active for this visit and any virtual visit you may have with one of our providers in the next 365 days. If you have a MyChart account, a copy of this consent can be sent to you electronically.  As this is a virtual visit, video technology does not allow for your provider to perform a traditional examination. This may limit your provider's ability to fully assess your condition. If your provider identifies any concerns that need to be evaluated in person or the need to arrange testing (such as labs, EKG, etc.), we will make arrangements to do so. Although advances in technology are sophisticated, we cannot ensure that it will always work on either your end or our end. If the connection with a video visit is poor, the visit may have to be switched to a telephone visit. With either a video or telephone visit, we are not always able to ensure that we have a secure connection.  By engaging in this virtual visit, you consent to the provision of healthcare and authorize for your insurance to be billed (if applicable) for the services provided during this visit. Depending on your insurance coverage, you may receive a charge related to this service.  I need to obtain your verbal consent now. Are you willing to proceed with your visit today? [name] has provided verbal consent on 06/19/2024 for a virtual visit (video or telephone). [name], [place]  Date: 06/19/2024 2:21 PM   Virtual Visit via Video Note   I, [name], connected with  SKYELER SCALESE  ([id number], Sep 17, 1975) on 06/19/24 at  2:00 PM EST by a video-enabled telemedicine application and verified that I am speaking with the correct person using two identifiers.  Location: Patient: Virtual Visit Location  Patient: Home Provider: Virtual Visit Location Provider: Home Office   I discussed the limitations of evaluation and management by telemedicine and the availability of in person appointments. The patient expressed understanding and agreed to proceed.    History of Present Illness: [name] is a 49 y.o. who identifies as a female who was assigned female at birth, and is being seen today for noticed swelling in her legs and now some tightness and swelling in her hands. Notes symptoms gradually occurring since being told to stop her Torsemide  20 mg one week ago due to mild AKI and hypokalemia.  Notes after 3rd day off medicine, swelling recurred and has been slowly increasing since then. Was scheduled this week to return for repeat labs and further recommendations but unable to go today due to the icy roads. Denies chest pain or SOB. Denies dyspnea or orthopnea.   HPI: HPI  Problems:  Patient Active Problem List   Diagnosis Date Noted   Panic anxiety syndrome 04/12/2024   B12 deficiency 01/03/2024   Vitamin D  deficiency 06/11/2023   Impaired fasting blood sugar 06/11/2023   Cervical disc disorder with radiculopathy of cervical region 03/09/2023   Uterine leiomyoma 10/28/2022   Fibroids 10/28/2022   Encounter for general adult medical examination with abnormal findings 05/04/2022   GERD (gastroesophageal reflux disease) 03/10/2022   Right wrist pain 04/11/2021   Obesity (BMI 30.0-34.9) 02/28/2021   Asthma 08/25/2020   Iron  deficiency anemia due to chronic blood loss 08/06/2020  Chronic right-sided low back pain without sciatica 06/24/2018   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   Hx of diabetes mellitus 06/02/2017   Insomnia 05/07/2017   ICD (implantable cardioverter-defibrillator), single, 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  Resting comfortably at home.  Head is normocephalic, atraumatic.  No labored breathing. Speech is clear and coherent with logical content.  Patient is alert and oriented at baseline.  Legs visualized. Scant edema noted of lower extremities bilaterally without erythema, lesion or evidence of venous stasis dermatitis.   Assessment and Plan: 1. Chronic combined systolic and diastolic heart failure (HCC) (Primary)  Re accumulation of peripheral edema since Torsemide  discontinued, behind held due to mild AKI from dehydration. Giving increase in weight over past week and inability for her to seek in person care today due to icy roads, will have her take 1/w tablet (10 mg) of Torsemide  once. Elevate legs, Minimize sodium. Plan for in-person evaluation and follow-up labs tomorrow. EMS precautions reviewed.  Follow Up Instructions: I discussed the assessment and treatment plan with the patient. The patient was provided an opportunity to ask questions and all were answered. The patient agreed with the plan and demonstrated an understanding of the instructions.  A copy of instructions were sent to the patient via MyChart unless otherwise noted below.   The patient was advised to call back or seek an in-person evaluation if the symptoms worsen or if the condition fails to improve as anticipated.    Megan Velma Lunger, PA-C    [1]  Allergies Allergen Reactions   Vancomycin  Other (See Comments)    did something to my kidneys, PROGRESSED TO KIDNEY FAILURE!!   Aspirin  Other  (See Comments)    Wheezing, (Pt states that she just wheezes some when she takes aspirin  by itself but she can take aspirin  in a combination product).   Contrast Media [Iodinated Contrast Media] Other (See Comments)    Multiple CT contrast studies done over 2 weeks caused ARF   Ciprofloxacin Itching and Rash   Cyclobenzaprine  Other (See Comments)    Can not tolerate   Farxiga  [Dapagliflozin] Rash   Sulfa Antibiotics Itching and Rash  [2]  Current Outpatient Medications:    albuterol  (VENTOLIN  HFA) 108 (90 Base) MCG/ACT inhaler, Inhale 1-2 puffs into the lungs every 6 (six) hours as needed for wheezing or shortness of breath., Disp: 18 g, Rfl: 0   clonazePAM  (KLONOPIN ) 0.5 MG tablet, Take 1 tablet (0.5 mg total) by mouth 2 (two) times daily as needed for anxiety., Disp: 30 tablet, Rfl: 1   diclofenac  (VOLTAREN ) 75 MG EC tablet, Take 1 tablet by mouth twice daily as needed, Disp: 60 tablet, Rfl: 0   empagliflozin  (JARDIANCE ) 10 MG TABS tablet, Take 1 tablet (10 mg total) by mouth daily before breakfast., Disp: 30 tablet, Rfl: 5   Insulin  Pen Needle (B-D UF III MINI PEN NEEDLES) 31G X 5 MM MISC, 1 Needle by Does not apply route daily., Disp: 30 each, Rfl: 0   ipratropium-albuterol  (DUONEB)  0.5-2.5 (3) MG/3ML SOLN, USE 1 AMPULE IN NEBULIZER EVERY 6 HOURS AS NEEDED, Disp: 360 mL, Rfl: 0   ivabradine  (CORLANOR ) 7.5 MG TABS tablet, TAKE 1 TABLET BY MOUTH TWICE DAILY WITH A MEAL, Disp: 60 tablet, Rfl: 0   losartan  (COZAAR ) 25 MG tablet, Take 0.5 tablets (12.5 mg total) by mouth at bedtime., Disp: 15 tablet, Rfl: 3   metoprolol  succinate (TOPROL  XL) 25 MG 24 hr tablet, Take 0.5 tablets (12.5 mg total) by mouth daily., Disp: 15 tablet, Rfl: 5   nitroGLYCERIN  (NITROSTAT ) 0.4 MG SL tablet, Place 1 tablet (0.4 mg total) under the tongue every 5 (five) minutes as needed for chest pain., Disp: 75 tablet, Rfl: 0   OVER THE COUNTER MEDICATION, Take 1 capsule by mouth daily. BARIATRIC VITAMIN, Disp: , Rfl:     PARoxetine  (PAXIL ) 20 MG tablet, Take 1 tablet (20 mg total) by mouth daily., Disp: 30 tablet, Rfl: 1   spironolactone  (ALDACTONE ) 25 MG tablet, Take 1 tablet (25 mg total) by mouth daily., Disp: 30 tablet, Rfl: 5   tirzepatide  (MOUNJARO ) 5 MG/0.5ML Pen, Inject 5 mg into the skin once a week., Disp: 2 mL, Rfl: 0   tizanidine  (ZANAFLEX ) 2 MG capsule, Take 2 mg by mouth as needed for muscle spasms., Disp: , Rfl:    torsemide  (DEMADEX ) 20 MG tablet, Take 1 tablet (20 mg total) by mouth every other day., Disp: 240 tablet, Rfl: 0   Ubrogepant  (UBRELVY ) 100 MG TABS, Take 1 tablet (100 mg total) by mouth daily as needed (migraine)., Disp: 30 tablet, Rfl: 3   Vitamin D , Ergocalciferol , (DRISDOL ) 1.25 MG (50000 UNIT) CAPS capsule, Take 1 capsule by mouth once a week, Disp: 12 capsule, Rfl: 0  "

## 2024-06-21 ENCOUNTER — Ambulatory Visit: Admitting: Licensed Clinical Social Worker

## 2024-06-21 ENCOUNTER — Ambulatory Visit

## 2024-06-21 DIAGNOSIS — I428 Other cardiomyopathies: Secondary | ICD-10-CM

## 2024-06-22 ENCOUNTER — Ambulatory Visit: Admitting: Family Medicine

## 2024-06-22 LAB — CUP PACEART REMOTE DEVICE CHECK
Battery Remaining Longevity: 90 mo
Battery Remaining Percentage: 64 %
Brady Statistic RV Percent Paced: 0 %
Date Time Interrogation Session: 20260128204900
HighPow Impedance: 120 Ohm
Implantable Lead Connection Status: 753985
Implantable Lead Implant Date: 20180312
Implantable Lead Location: 753860
Implantable Lead Model: 293
Implantable Lead Serial Number: 422842
Implantable Pulse Generator Implant Date: 20180312
Lead Channel Impedance Value: 1016 Ohm
Lead Channel Setting Pacing Amplitude: 3.5 V
Lead Channel Setting Pacing Pulse Width: 1 ms
Lead Channel Setting Sensing Sensitivity: 0.5 mV
Pulse Gen Serial Number: 226601
Zone Setting Status: 755011

## 2024-06-22 NOTE — Progress Notes (Signed)
 31 day ICM Remote transmission canceled due to [facility] clinic is on hold until further notice.  91 day remote monitoring will continue per protocol.

## 2024-06-25 ENCOUNTER — Ambulatory Visit: Payer: Self-pay | Admitting: Student in an Organized Health Care Education/Training Program

## 2024-06-26 ENCOUNTER — Ambulatory Visit: Admitting: Obstetrics and Gynecology

## 2024-06-29 ENCOUNTER — Ambulatory Visit: Admitting: Licensed Clinical Social Worker

## 2024-06-29 ENCOUNTER — Ambulatory Visit

## 2024-06-29 NOTE — Progress Notes (Signed)
 Remote ICD Transmission

## 2024-06-30 ENCOUNTER — Other Ambulatory Visit (HOSPITAL_COMMUNITY): Payer: Self-pay | Admitting: Physician Assistant

## 2024-07-04 ENCOUNTER — Ambulatory Visit (INDEPENDENT_AMBULATORY_CARE_PROVIDER_SITE_OTHER): Admitting: Internal Medicine

## 2024-07-05 ENCOUNTER — Ambulatory Visit: Admitting: Family Medicine

## 2024-08-01 ENCOUNTER — Ambulatory Visit (INDEPENDENT_AMBULATORY_CARE_PROVIDER_SITE_OTHER): Admitting: Internal Medicine

## 2024-09-20 ENCOUNTER — Encounter

## 2024-12-20 ENCOUNTER — Encounter

## 2025-03-01 ENCOUNTER — Ambulatory Visit
